# Patient Record
Sex: Male | Born: 1955
Health system: Southern US, Community
[De-identification: ages and names within clinical notes are randomized; demographics above are authoritative.]

## PROBLEM LIST (undated history)

## (undated) DIAGNOSIS — N186 End stage renal disease: Secondary | ICD-10-CM

## (undated) DIAGNOSIS — I10 Essential (primary) hypertension: Secondary | ICD-10-CM

## (undated) DIAGNOSIS — Z9289 Personal history of other medical treatment: Secondary | ICD-10-CM

## (undated) DIAGNOSIS — I2510 Atherosclerotic heart disease of native coronary artery without angina pectoris: Secondary | ICD-10-CM

## (undated) DIAGNOSIS — Z992 Dependence on renal dialysis: Secondary | ICD-10-CM

## (undated) DIAGNOSIS — R0600 Dyspnea, unspecified: Secondary | ICD-10-CM

## (undated) DIAGNOSIS — E079 Disorder of thyroid, unspecified: Secondary | ICD-10-CM

## (undated) DIAGNOSIS — B1920 Unspecified viral hepatitis C without hepatic coma: Secondary | ICD-10-CM

## (undated) DIAGNOSIS — F1910 Other psychoactive substance abuse, uncomplicated: Secondary | ICD-10-CM

## (undated) DIAGNOSIS — I739 Peripheral vascular disease, unspecified: Secondary | ICD-10-CM

## (undated) DIAGNOSIS — D649 Anemia, unspecified: Secondary | ICD-10-CM

## (undated) DIAGNOSIS — J449 Chronic obstructive pulmonary disease, unspecified: Secondary | ICD-10-CM

## (undated) HISTORY — PX: COLONOSCOPY: SHX174

## (undated) HISTORY — DX: Chronic obstructive pulmonary disease, unspecified: J44.9

## (undated) HISTORY — PX: LIVER BIOPSY: SHX301

## (undated) HISTORY — DX: Anemia, unspecified: D64.9

## (undated) HISTORY — PX: AV FISTULA PLACEMENT: SHX1204

## (undated) HISTORY — DX: Essential (primary) hypertension: I10

## (undated) HISTORY — DX: Disorder of thyroid, unspecified: E07.9

## (undated) HISTORY — DX: Peripheral vascular disease, unspecified: I73.9

## (undated) HISTORY — DX: Unspecified viral hepatitis C without hepatic coma: B19.20

## (undated) HISTORY — DX: Other psychoactive substance abuse, uncomplicated: F19.10

---

## 2003-10-16 ENCOUNTER — Encounter: Admission: RE | Admit: 2003-10-16 | Discharge: 2003-10-16 | Payer: Self-pay | Admitting: Family Medicine

## 2003-11-24 ENCOUNTER — Encounter: Admission: RE | Admit: 2003-11-24 | Discharge: 2003-11-24 | Payer: Self-pay | Admitting: Family Medicine

## 2003-12-09 ENCOUNTER — Encounter: Admission: RE | Admit: 2003-12-09 | Discharge: 2003-12-09 | Payer: Self-pay | Admitting: Sports Medicine

## 2003-12-11 ENCOUNTER — Ambulatory Visit (HOSPITAL_COMMUNITY): Admission: RE | Admit: 2003-12-11 | Discharge: 2003-12-11 | Payer: Self-pay | Admitting: Sports Medicine

## 2003-12-11 IMAGING — CT CT PELVIS W/O CM
1 series · 15 of 32 positions shown, 19 images · non-contrast
Comparison: None.

CLINICAL DATA: Left flank pain; proteinuria.
TECHNIQUE: Initially, helical CT through the abdomen and pelvis was performed at 5-mm collimation prior to IV contrast.  Oral contrast had been administered.  Subsequently, repeat helical CT through the abdomen was performed at 5-mm collimation during the intravenous administration of 100 cc [BV].  Delayed helical 5-mm images through the kidneys were obtained.

[Series 2: renal stone · axial · 0.70mm/px · z∈[-430,-95]mm · 15 of 143 slices shown, 19 images]
[im 10/143  soft-tissue]
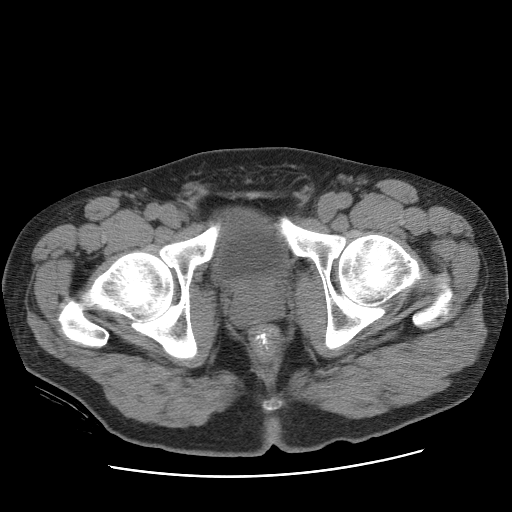
[im 10/143  bone]
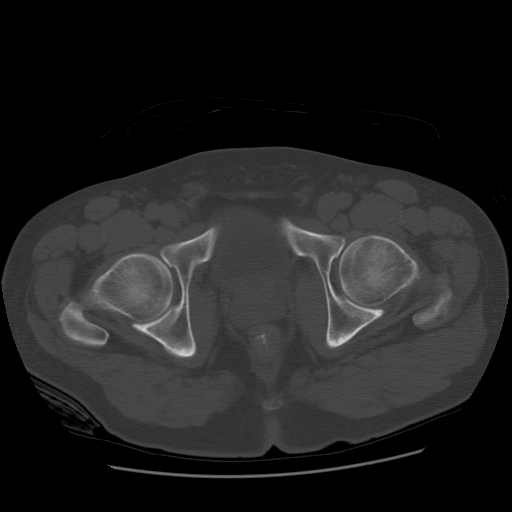
[im 19/143  soft-tissue]
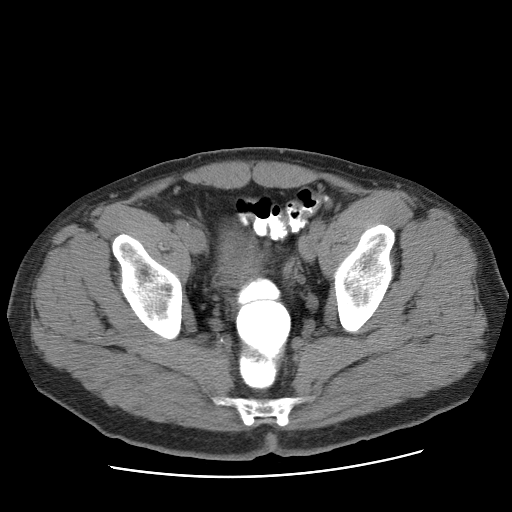
[im 28/143  soft-tissue]
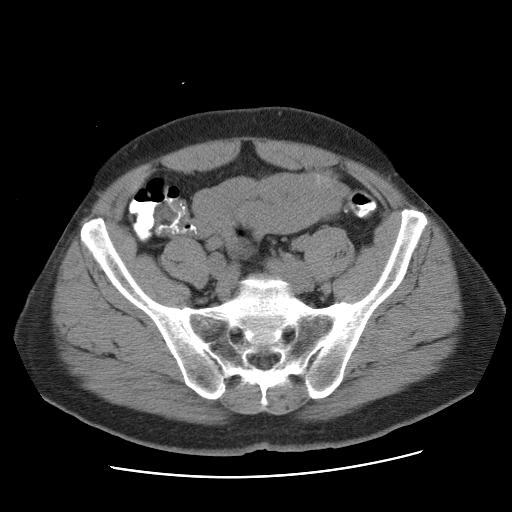
[im 42/143  soft-tissue]
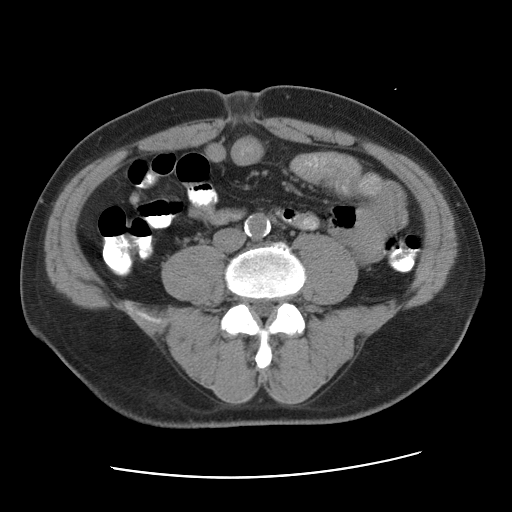
[im 51/143  soft-tissue]
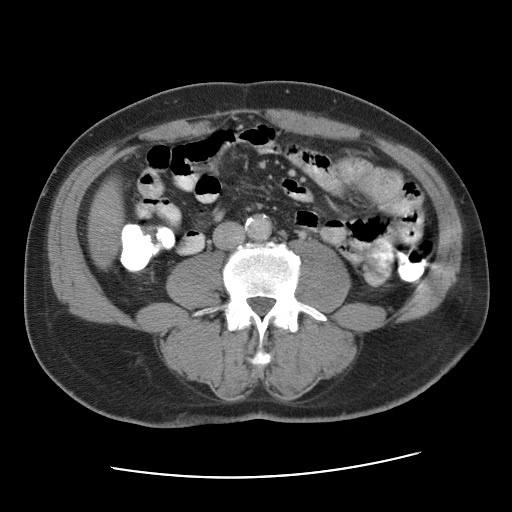
[im 60/143  soft-tissue]
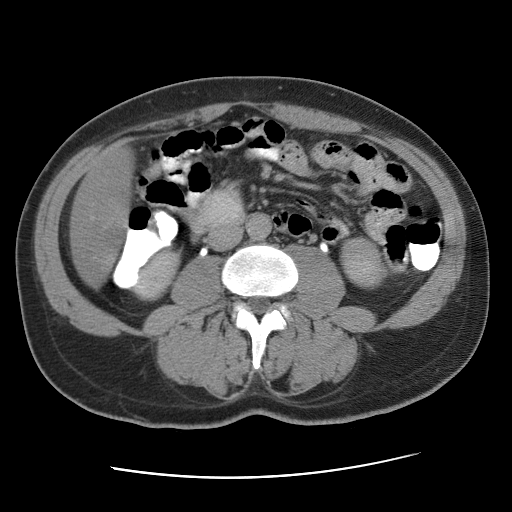
[im 74/143  soft-tissue]
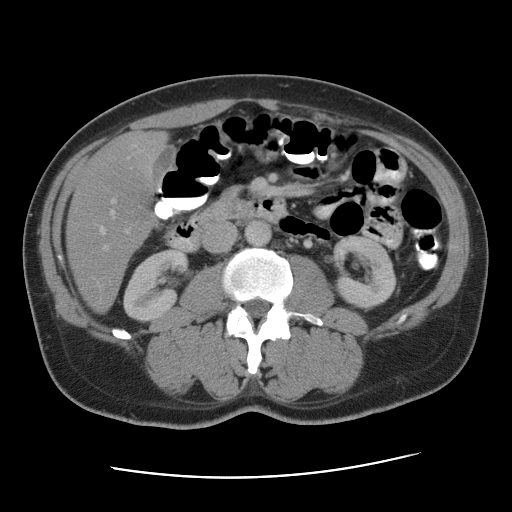
[im 83/143  soft-tissue]
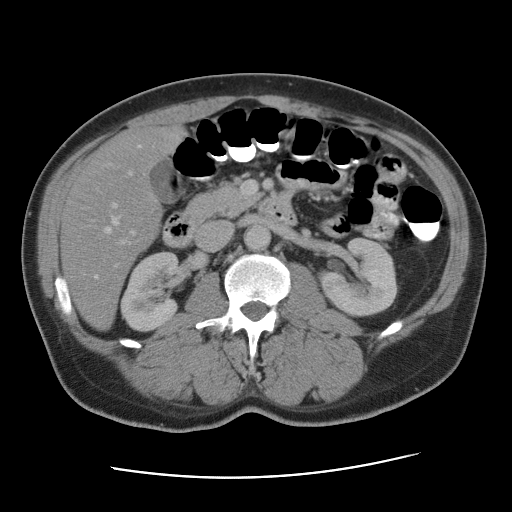
[im 92/143  soft-tissue]
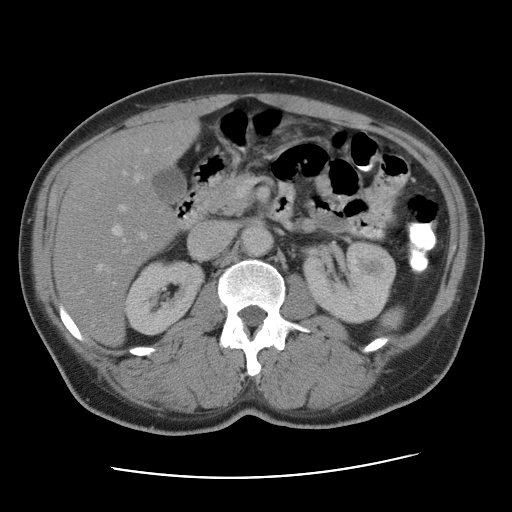
[im 92/143  bone]
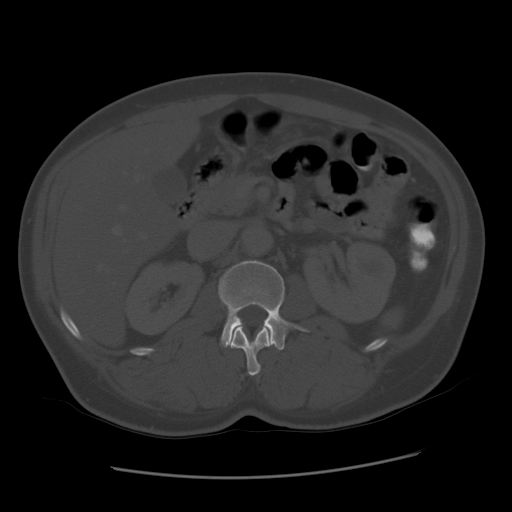
[im 101/143  soft-tissue]
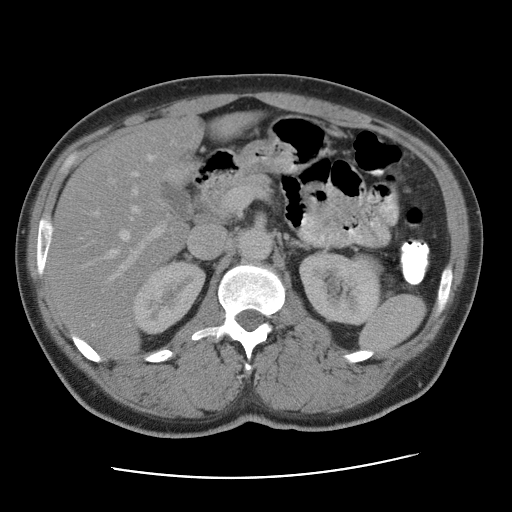
[im 115/143  soft-tissue]
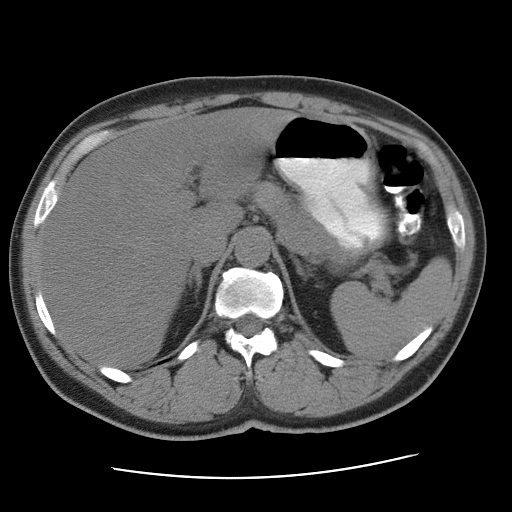
[im 124/143  soft-tissue]
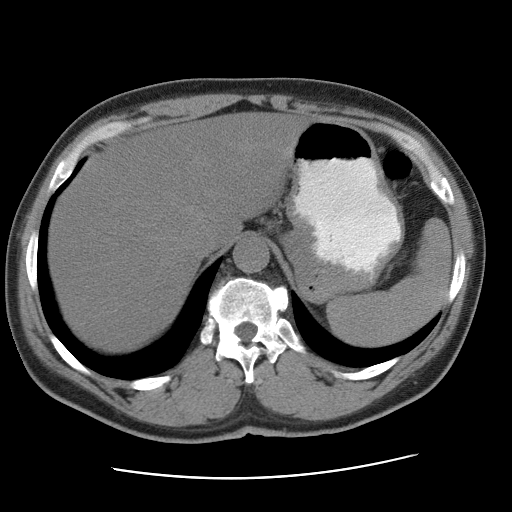
[im 124/143  lung]
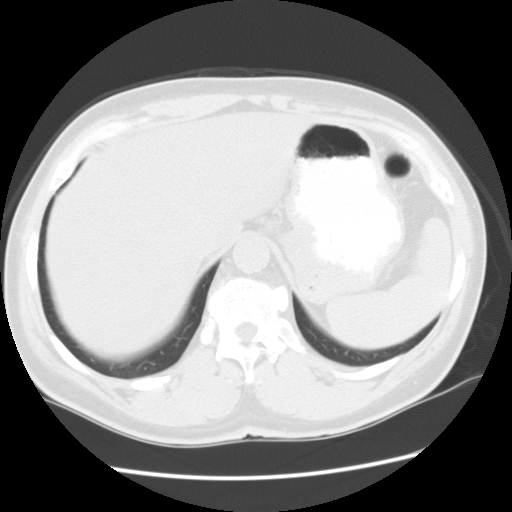
[im 129/143  lung]
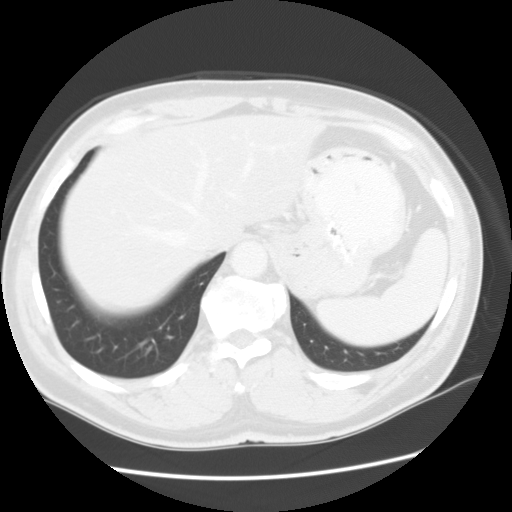
[im 133/143  soft-tissue]
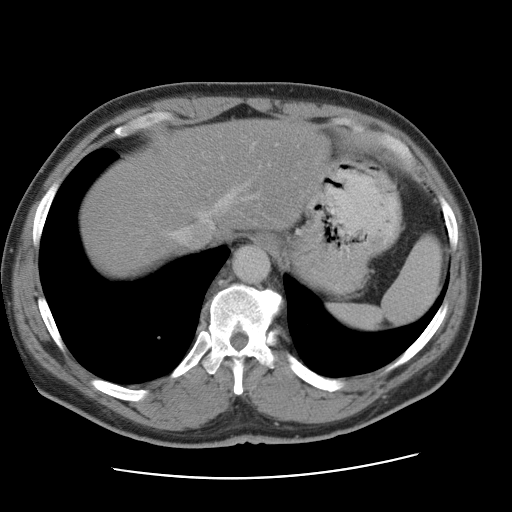
[im 133/143  lung]
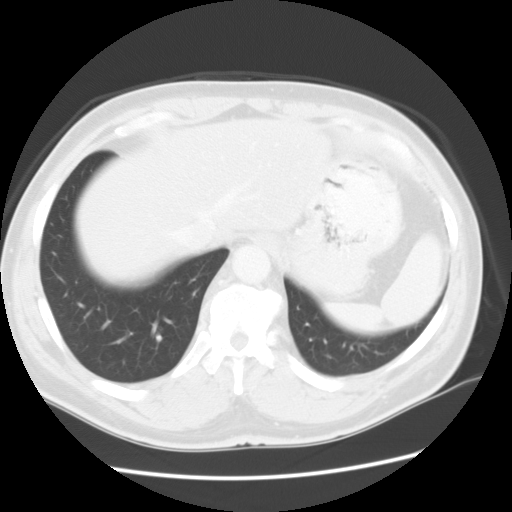
[im 138/143  lung]
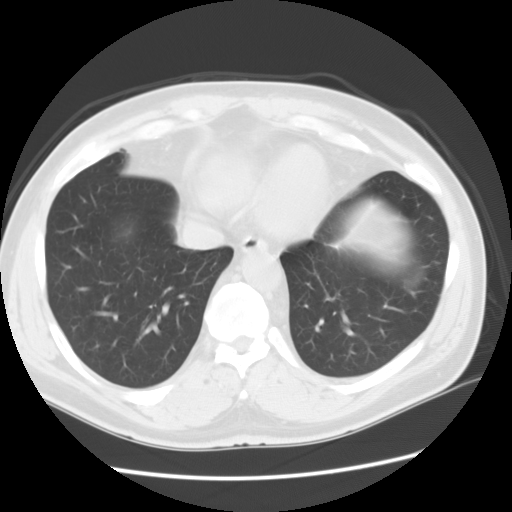

[15 of 32 positions shown; findings below may reference images not displayed]

CT ABDOMEN-WITHOUT AND WITH CONTRAST:  [DATE]
The unenhanced images demonstrate no intrarenal or proximal ureteral calculi.  The enhanced images show normal and symmetric excretion by both kidneys.  There is no evidence of hydronephrosis.  Simple cysts are present in the left kidney; no significant focal renal parenchymal abnormalities are identified involving either kidney.  
The liver, spleen, pancreas, and adrenal glands are normal in appearance.  The gallbladder is unremarkable by CT and there is no biliary ductal dilation.  Stomach and visualized large and small bowel are unremarkable in the upper abdomen.  There is no significant lymphadenopathy.  Aortic atherosclerosis is present without aneurysm.  There is no free fluid.  Visualized lung bases appear clear.  Note is made of an umbilical hernia containing fat.  
IMPRESSION
Left renal cysts.  No significant abnormalities involving the kidneys.  No evidence of urinary tract obstruction or calculi.
Small umbilical hernia containing fat.
Aortic atherosclerosis without aneurysm.

CT PELVIS WITHOUT CONTRAST:  [DATE]
No distal ureteral calculi are identified on either side.  Prostate gland is mildly enlarged, particularly the median lobe.  Urinary bladder is grossly normal.  Scattered sigmoid diverticula are present without diverticulitis.  Mild iliac atherosclerosis is present without aneurysm.  Small bowel has normal appearance.  There is no free fluid.  
IMPRESSION
No distal ureteral calculi.
Mild prostate gland enlargement.
Sigmoid diverticulosis.

## 2006-06-09 ENCOUNTER — Emergency Department (HOSPITAL_COMMUNITY): Admission: EM | Admit: 2006-06-09 | Discharge: 2006-06-09 | Payer: Self-pay | Admitting: Emergency Medicine

## 2006-09-07 DIAGNOSIS — I10 Essential (primary) hypertension: Secondary | ICD-10-CM | POA: Insufficient documentation

## 2006-09-07 DIAGNOSIS — B1710 Acute hepatitis C without hepatic coma: Secondary | ICD-10-CM | POA: Insufficient documentation

## 2006-09-07 DIAGNOSIS — E7800 Pure hypercholesterolemia, unspecified: Secondary | ICD-10-CM | POA: Insufficient documentation

## 2006-10-13 ENCOUNTER — Ambulatory Visit: Payer: Self-pay | Admitting: Internal Medicine

## 2006-10-30 ENCOUNTER — Ambulatory Visit: Payer: Self-pay | Admitting: Internal Medicine

## 2006-11-01 ENCOUNTER — Ambulatory Visit: Payer: Self-pay | Admitting: Internal Medicine

## 2006-11-03 ENCOUNTER — Ambulatory Visit: Payer: Self-pay | Admitting: Internal Medicine

## 2007-08-01 ENCOUNTER — Ambulatory Visit: Payer: Self-pay | Admitting: Internal Medicine

## 2007-08-01 LAB — CONVERTED CEMR LAB
BUN: 19 mg/dL (ref 6–23)
CO2: 25 meq/L (ref 19–32)
Calcium: 8 mg/dL — ABNORMAL LOW (ref 8.4–10.5)
Chlamydia, DNA Probe: NEGATIVE
Chloride: 106 meq/L (ref 96–112)
Creatinine, Ser: 1.62 mg/dL — ABNORMAL HIGH (ref 0.40–1.50)
GC Probe Amp, Genital: POSITIVE — AB
Glucose, Bld: 78 mg/dL (ref 70–99)
Potassium: 4 meq/L (ref 3.5–5.3)
Sodium: 141 meq/L (ref 135–145)

## 2007-09-05 ENCOUNTER — Ambulatory Visit: Payer: Self-pay | Admitting: *Deleted

## 2007-10-04 ENCOUNTER — Ambulatory Visit: Payer: Self-pay | Admitting: Internal Medicine

## 2007-10-04 LAB — CONVERTED CEMR LAB
ALT: 22 units/L (ref 0–53)
AST: 38 units/L — ABNORMAL HIGH (ref 0–37)
Albumin: 3.5 g/dL (ref 3.5–5.2)
Alkaline Phosphatase: 63 units/L (ref 39–117)
BUN: 24 mg/dL — ABNORMAL HIGH (ref 6–23)
CO2: 24 meq/L (ref 19–32)
Calcium: 8.5 mg/dL (ref 8.4–10.5)
Chloride: 107 meq/L (ref 96–112)
Cholesterol: 199 mg/dL (ref 0–200)
Creatinine, Ser: 1.91 mg/dL — ABNORMAL HIGH (ref 0.40–1.50)
Glucose, Bld: 69 mg/dL — ABNORMAL LOW (ref 70–99)
HDL: 101 mg/dL (ref >39)
LDL Cholesterol: 67 mg/dL (ref 0–99)
Potassium: 4.6 meq/L (ref 3.5–5.3)
Sodium: 141 meq/L (ref 135–145)
Total Bilirubin: 0.5 mg/dL (ref 0.3–1.2)
Total CHOL/HDL Ratio: 2
Total Protein: 7.1 g/dL (ref 6.0–8.3)
Triglycerides: 154 mg/dL — ABNORMAL HIGH (ref <150)
VLDL: 31 mg/dL (ref 0–40)

## 2007-11-19 ENCOUNTER — Encounter: Payer: Self-pay | Admitting: Internal Medicine

## 2007-11-19 LAB — CONVERTED CEMR LAB
ALT: 14 units/L (ref 0–53)
AST: 17 units/L (ref 0–37)
Absolute CD4: 312 #/uL — ABNORMAL LOW (ref 381–1469)
Albumin: 4.7 g/dL (ref 3.5–5.2)
Alkaline Phosphatase: 97 units/L (ref 39–117)
BUN: 17 mg/dL (ref 6–23)
Basophils Absolute: 0 10*3/uL (ref 0.0–0.1)
Basophils Relative: 1 % (ref 0–1)
CD4 T Helper %: 18 % — ABNORMAL LOW (ref 32–62)
CO2: 22 meq/L (ref 19–32)
Calcium: 9.2 mg/dL (ref 8.4–10.5)
Chloride: 106 meq/L (ref 96–112)
Creatinine, Ser: 0.97 mg/dL (ref 0.40–1.50)
Eosinophils Absolute: 0.4 10*3/uL (ref 0.0–0.7)
Eosinophils Relative: 6 % — ABNORMAL HIGH (ref 0–5)
Glucose, Bld: 83 mg/dL (ref 70–99)
HCT: 41.2 % (ref 39.0–52.0)
HIV 1 RNA Quant: <50 copies/mL (ref <50)
HIV-1 RNA Quant, Log: <1.7 (ref <1.70)
Hemoglobin: 13.3 g/dL (ref 13.0–17.0)
Hep B S Ab: POSITIVE — AB
Lymphocytes Relative: 31 % (ref 12–46)
Lymphs Abs: 1.7 10*3/uL (ref 0.7–4.0)
MCHC: 32.3 g/dL (ref 30.0–36.0)
MCV: 92.4 fL (ref 78.0–100.0)
Monocytes Absolute: 1 10*3/uL (ref 0.1–1.0)
Monocytes Relative: 17 % — ABNORMAL HIGH (ref 3–12)
Neutro Abs: 2.6 10*3/uL (ref 1.7–7.7)
Neutrophils Relative %: 45 % (ref 43–77)
Platelets: 308 10*3/uL (ref 150–400)
Potassium: 4.2 meq/L (ref 3.5–5.3)
RBC: 4.46 M/uL (ref 4.22–5.81)
RDW: 14.5 % (ref 11.5–15.5)
Sodium: 139 meq/L (ref 135–145)
Total Bilirubin: 0.4 mg/dL (ref 0.3–1.2)
Total Lymphocytes %: 31 % (ref 12–46)
Total Protein: 7.8 g/dL (ref 6.0–8.3)
Total lymphocyte count: 1736 cells/mcL (ref 700–3300)
WBC, lymph enumeration: 5.6 10*3/uL (ref 4.0–10.5)
WBC: 5.6 10*3/uL (ref 4.0–10.5)

## 2008-01-30 ENCOUNTER — Emergency Department (HOSPITAL_COMMUNITY): Admission: EM | Admit: 2008-01-30 | Discharge: 2008-01-30 | Payer: Self-pay | Admitting: Emergency Medicine

## 2008-01-30 IMAGING — CT CT CERVICAL SPINE W/O CM
4 of 5 series · 15 of 33 positions shown, 17 images · non-contrast
Comparison: None

CT HEAD

CLINICAL DATA: Fell off ladder, laceration of left

CT CERVICAL SPINE WITHOUT CONTRAST,CT HEAD WITHOUT CONTRAST
TECHNIQUE: Multidetector CT imaging of the cervical spine was
performed. Multiplanar CT image reconstructions were also
generated,Technique:  Contiguous axial images were obtained from
the base of the skull through the vertex without contrast..

[Series 4: head trauma 2.4 h60s · axial · 0.45mm/px · z∈[-565,-478]mm · 3 of 72 slices shown]
[im 18/72  bone]
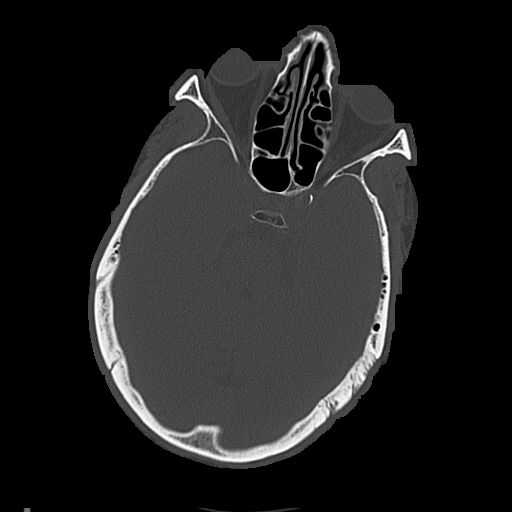
[im 36/72  bone]
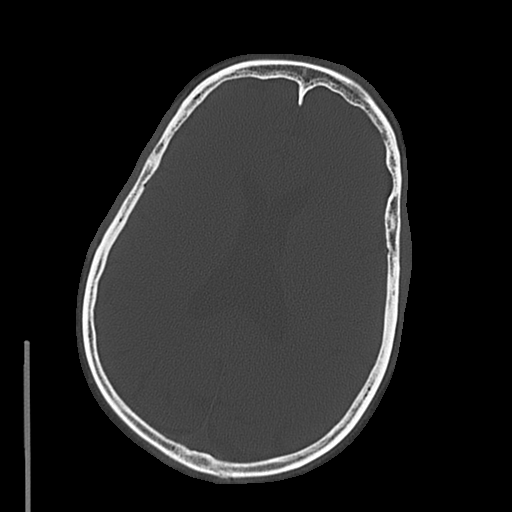
[im 54/72  bone]
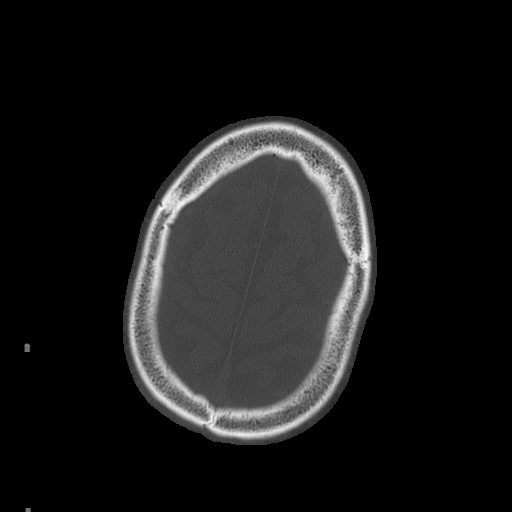

[Series 6: c_spine 2.0 b31s detail · axial · 0.29mm/px · z∈[-730,-622]mm · 4 of 91 slices shown, 5 images]
[im 19/91  soft-tissue]
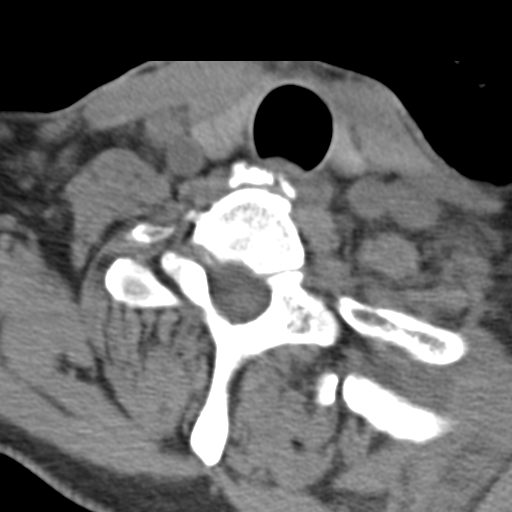
[im 19/91  bone]
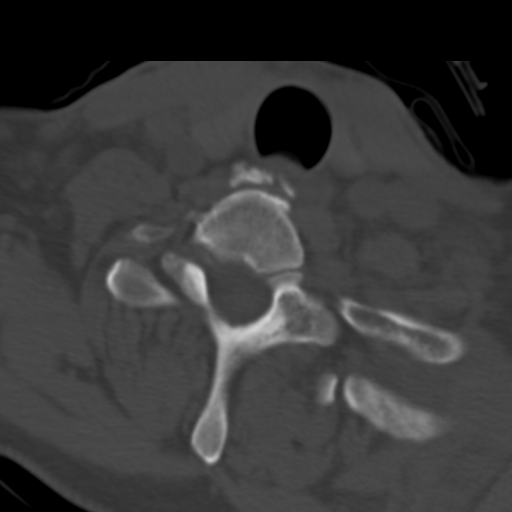
[im 37/91  bone]
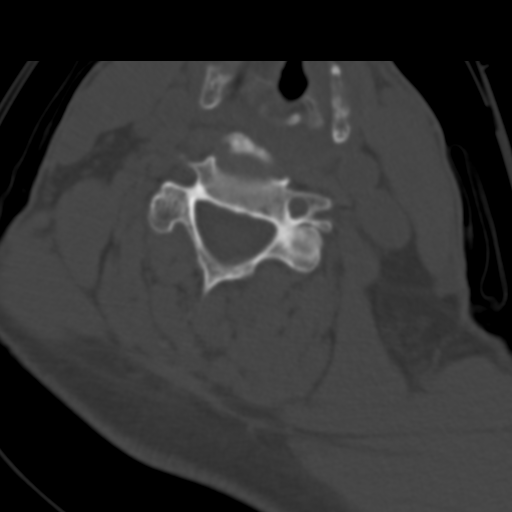
[im 55/91  bone]
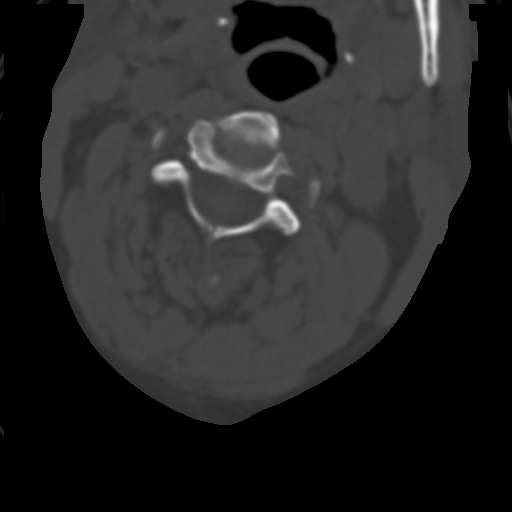
[im 73/91  bone]
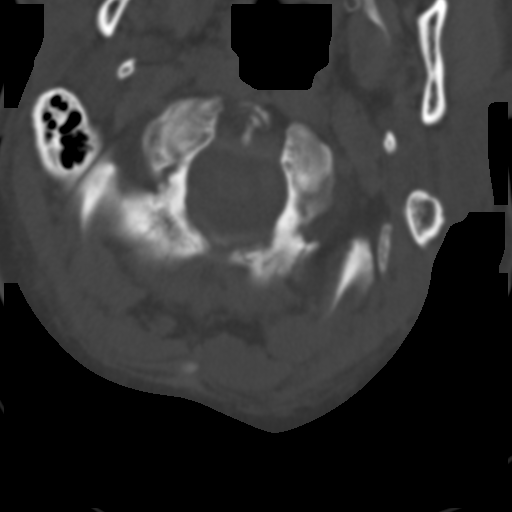

[Series 604: sagittals s.t. · sagittal · 0.35mm/px · 5 of 48 slices shown, 6 images]
[im 16/48  bone]
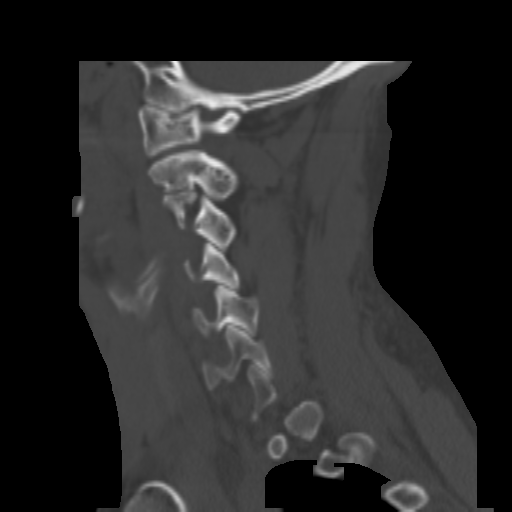
[im 20/48  bone]
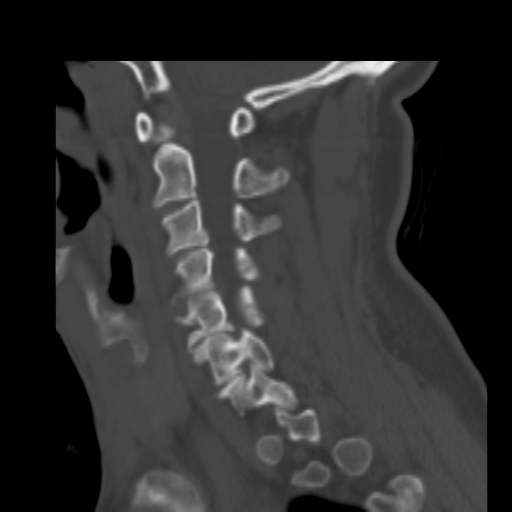
[im 24/48  soft-tissue]
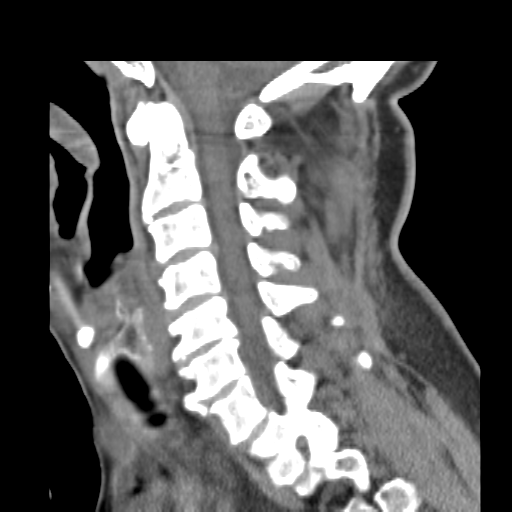
[im 24/48  bone]
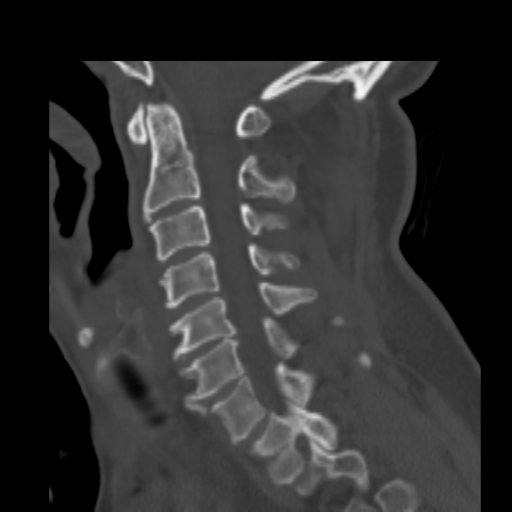
[im 28/48  bone]
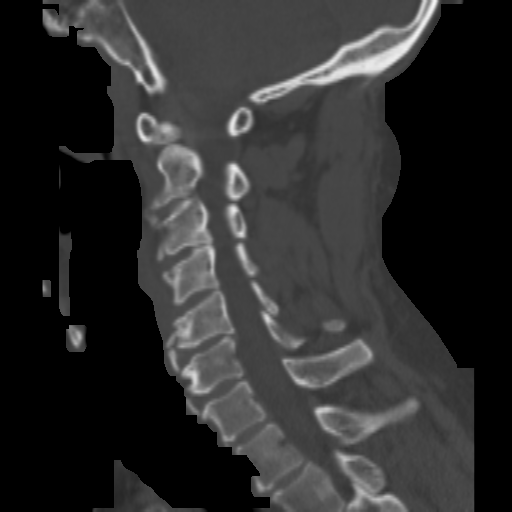
[im 32/48  bone]
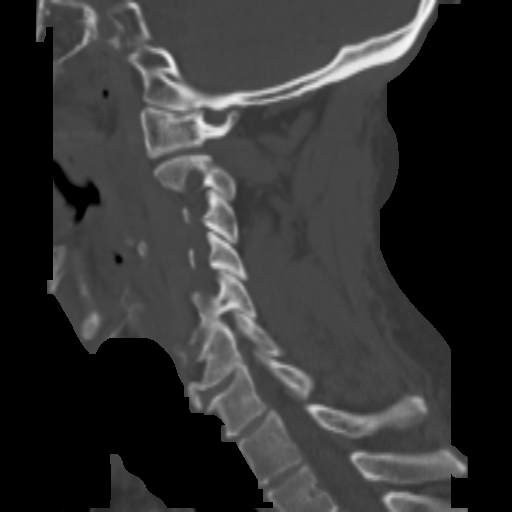

[Series 605: coronals s.t. · coronal · 0.35mm/px · 3 of 50 slices shown]
[im 10/50  bone]
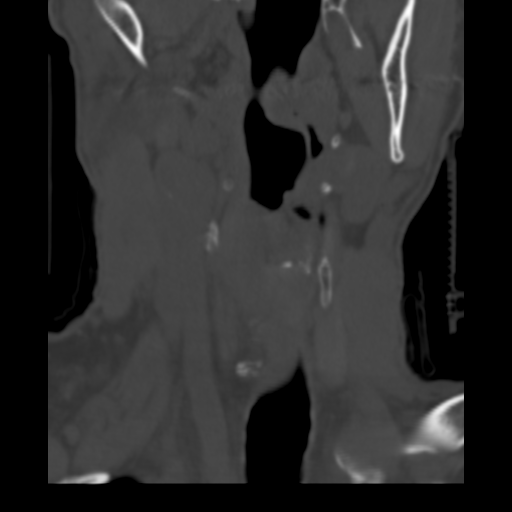
[im 20/50  bone]
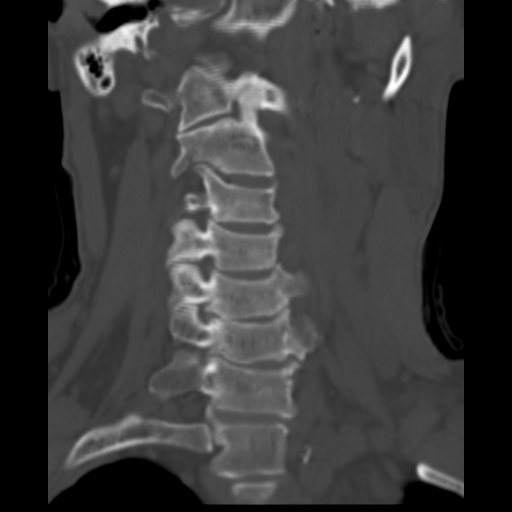
[im 30/50  bone]
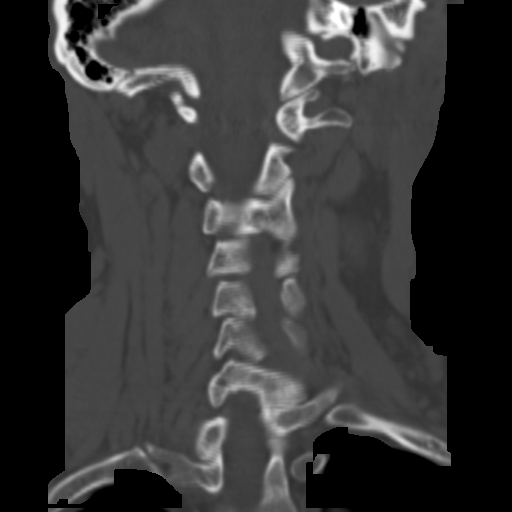

[15 of 33 positions shown; findings below may reference images not displayed]

FINDINGS: No extra-axial fluid collections or intraparenchymal
hemorrhage.  No midline shift or mass effect.  Ventricles are
normal volume.  Basilar cisterns are patent.  Paranasal sinuses and
mastoid air cells are clear.  Orbits are normal.

The small scalp hematoma and just superior to the left orbit.  No
associated skull fracture.
IMPRESSION: 1.  No evidence of acute intracranial trauma.

2.  Small scalp hematoma superior to the left orbit.

CT CERVICAL SPINE
FINDINGS: No prevertebral soft tissue swelling.  No evidence of
subluxation.  Craniocervical junction is intact.  Normal facet
articulation.  No evidence of epidural or paraspinal hematoma.
There is end plate osteophytosis most prominent at C5-C6 and C6-C7.
IMPRESSION: 1. No evidence of cervical spine fracture.

## 2008-01-30 IMAGING — CR DG SHOULDER 2+V*R*
2 series · 2 of 2 positions shown · non-contrast
Comparison: No comparison

CLINICAL DATA: Fell off ladder - right shoulder pain.

RIGHT SHOULDER - 2+ VIEW

[t shoulder ap internal righ]
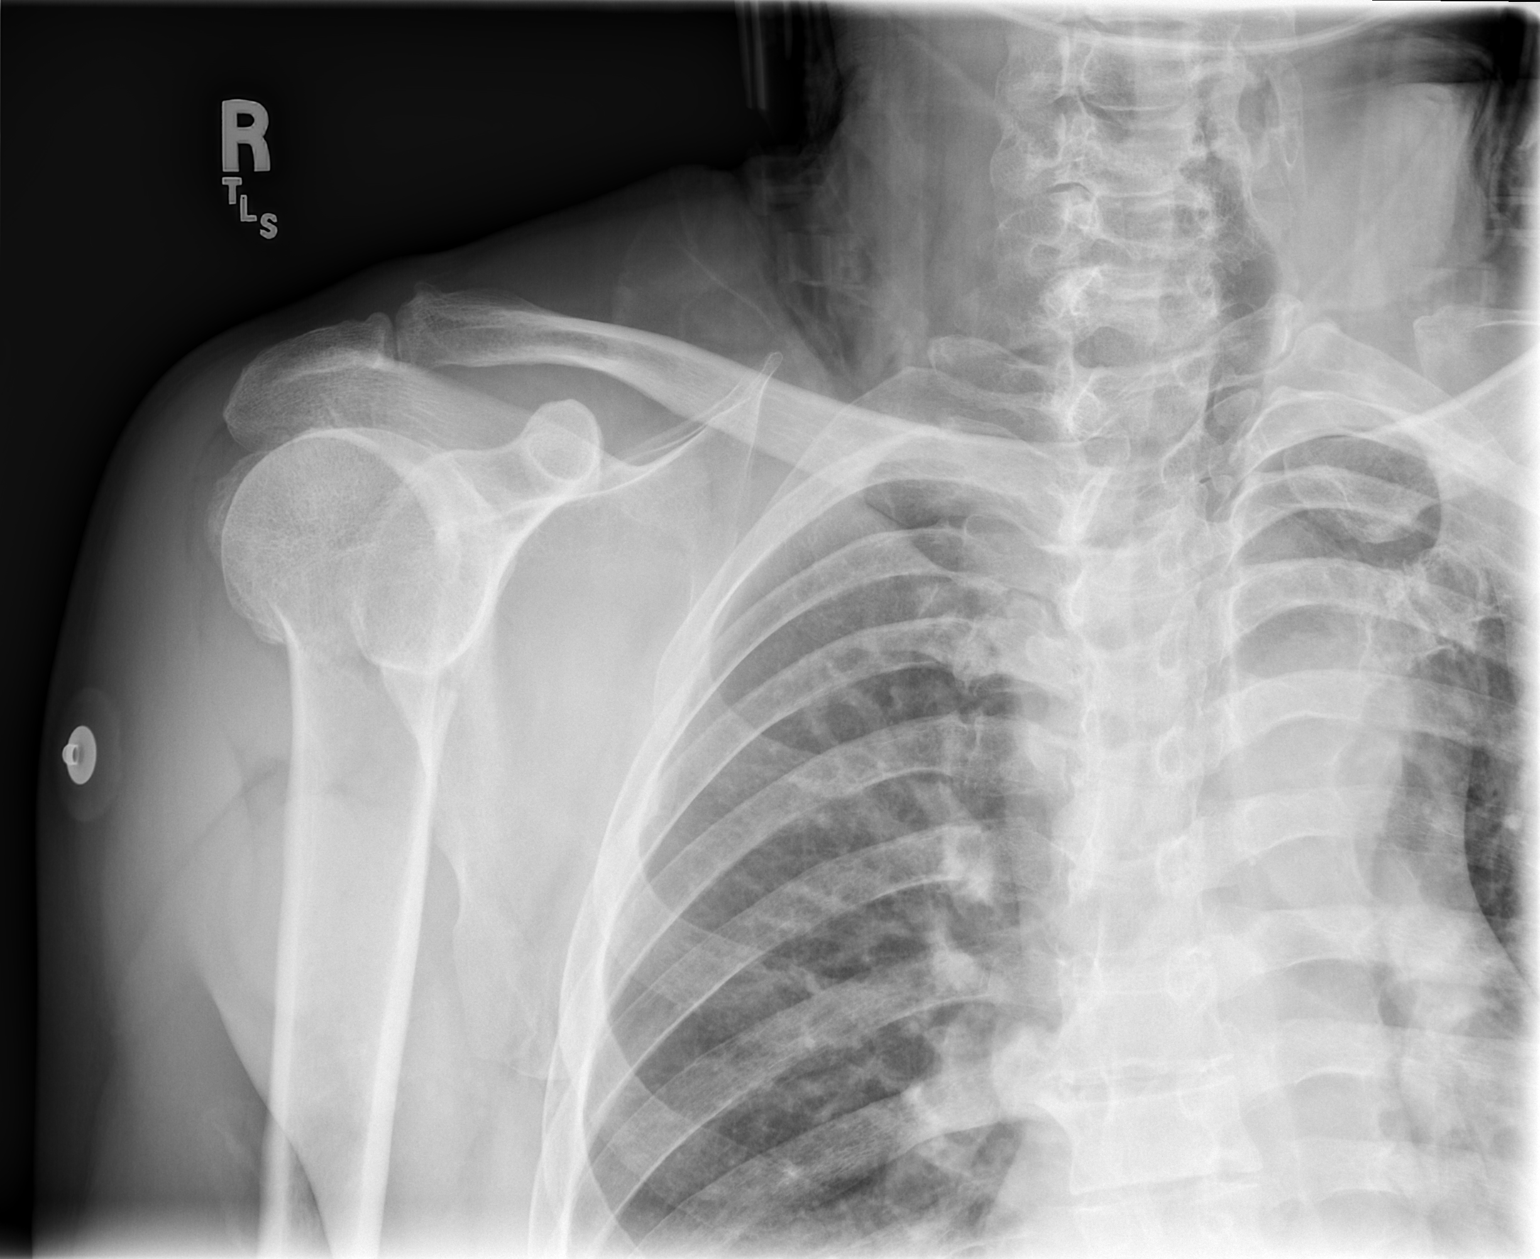

[t shoulder y view right]
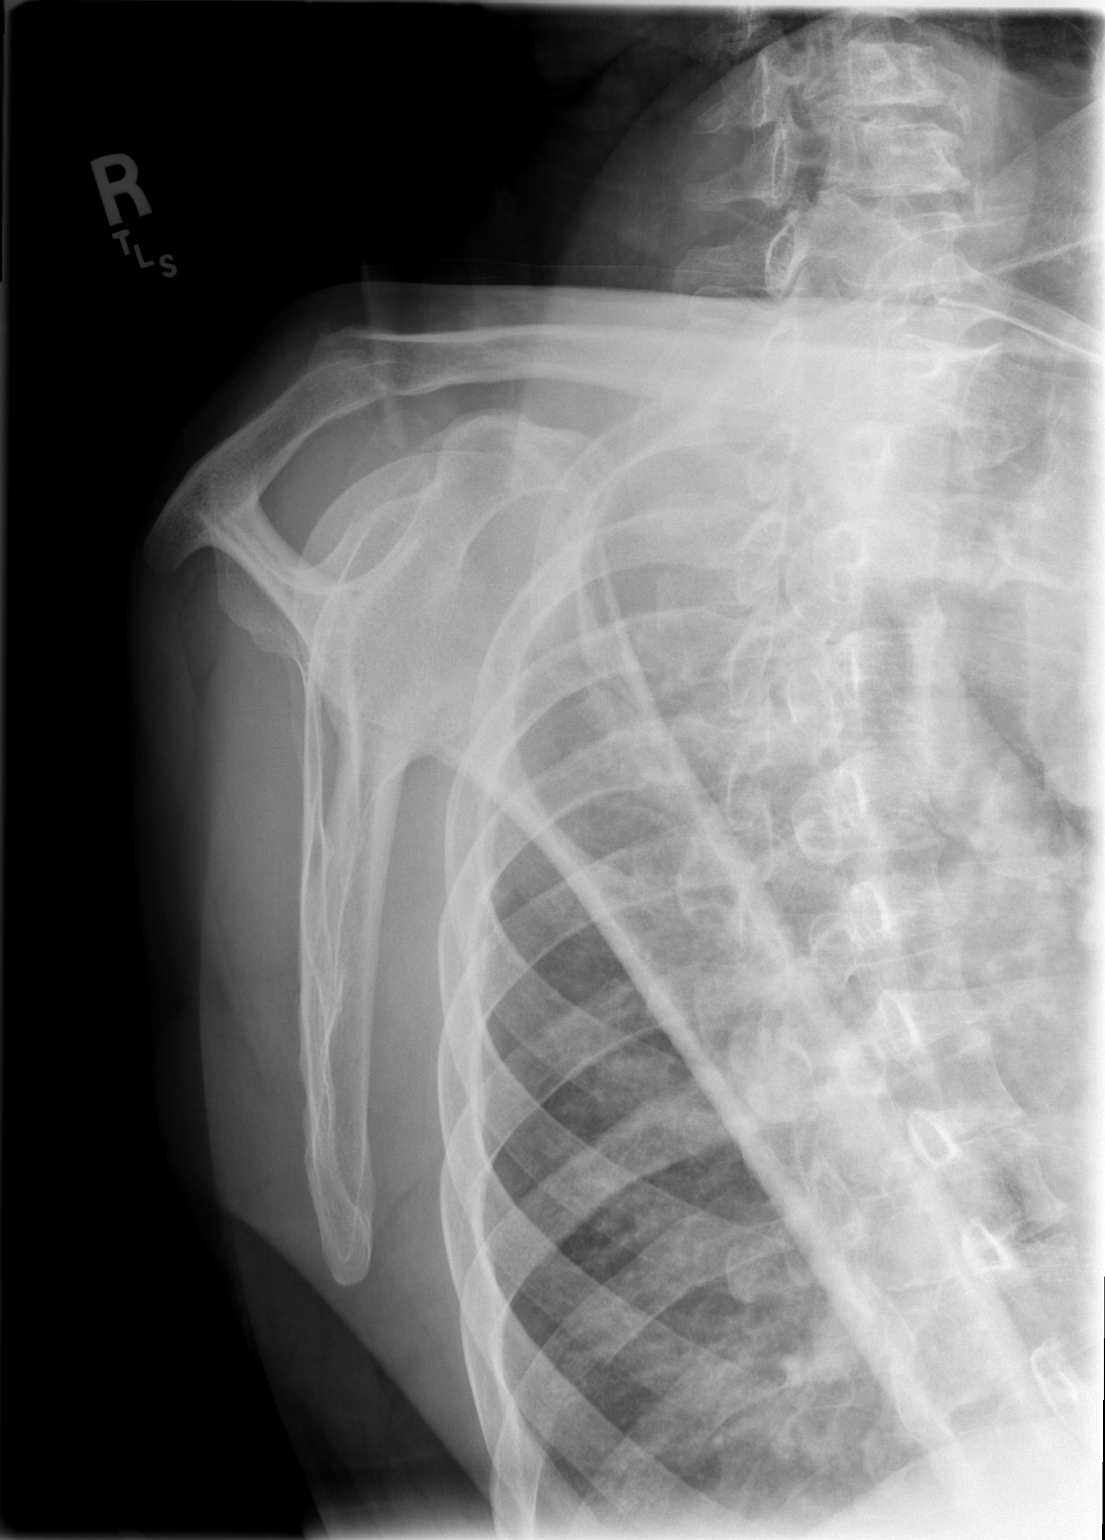

[2 of 2 positions shown; findings below may reference images not displayed]

FINDINGS: There is a fracture of the right humeral head and neck
with what appears to be impaction of the humeral neck into the
head.  The glenohumeral joint appears anatomical.  There are
degenerative changes of the AC joint.
IMPRESSION: Acute  fracture of the right humeral head and neck.

## 2008-04-10 ENCOUNTER — Ambulatory Visit: Payer: Self-pay | Admitting: Internal Medicine

## 2009-02-05 ENCOUNTER — Ambulatory Visit: Payer: Self-pay | Admitting: Internal Medicine

## 2010-05-07 ENCOUNTER — Ambulatory Visit: Payer: Self-pay | Admitting: Internal Medicine

## 2010-05-07 ENCOUNTER — Inpatient Hospital Stay (HOSPITAL_COMMUNITY)
Admission: EM | Admit: 2010-05-07 | Discharge: 2010-05-17 | Payer: Self-pay | Source: Home / Self Care | Admitting: Emergency Medicine

## 2010-05-07 IMAGING — CT CT ABD-PELV W/O CM
2 of 4 series · 14 of 32 positions shown, 19 images · non-contrast
Comparison: CT abdomen [DATE]

CLINICAL DATA: , acute renal failure, decreased hemoglobin.

CT ABDOMEN AND PELVIS WITHOUT CONTRAST
TECHNIQUE: Multidetector CT imaging of the abdomen and pelvis was
performed following the standard protocol without intravenous
contrast.

[Series 2: routine abdomen · axial · 0.70mm/px · z∈[-418,-88]mm · 7 of 88 slices shown, 12 images]
[im 11/88  soft-tissue]
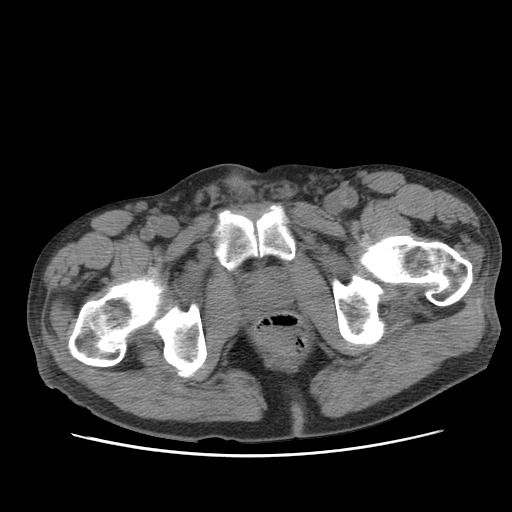
[im 11/88  bone]
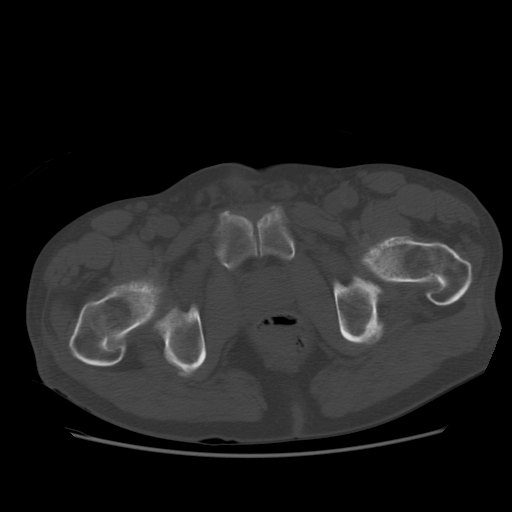
[im 22/88  soft-tissue]
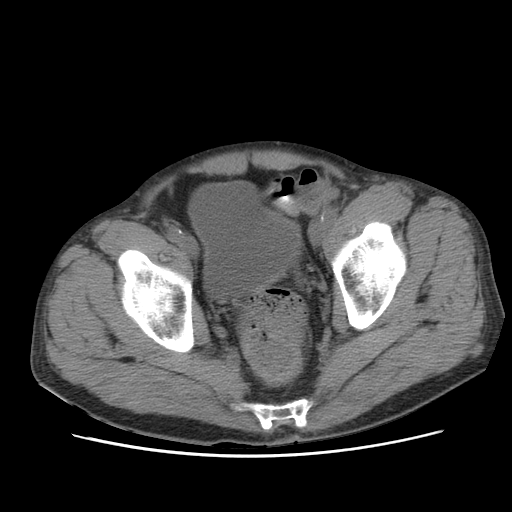
[im 33/88  soft-tissue]
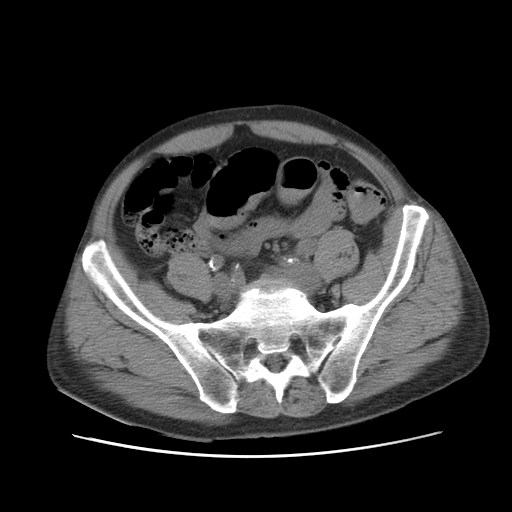
[im 44/88  soft-tissue]
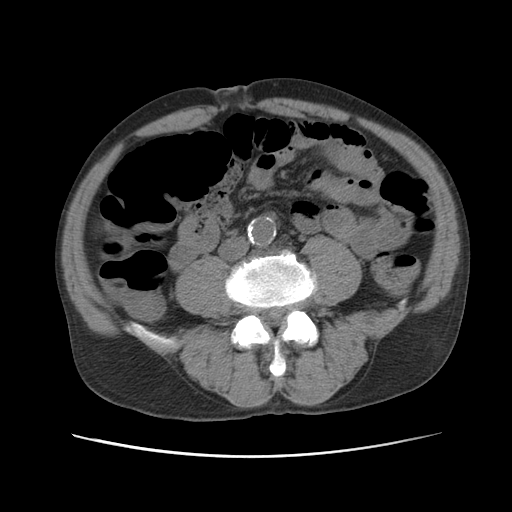
[im 44/88  lung]
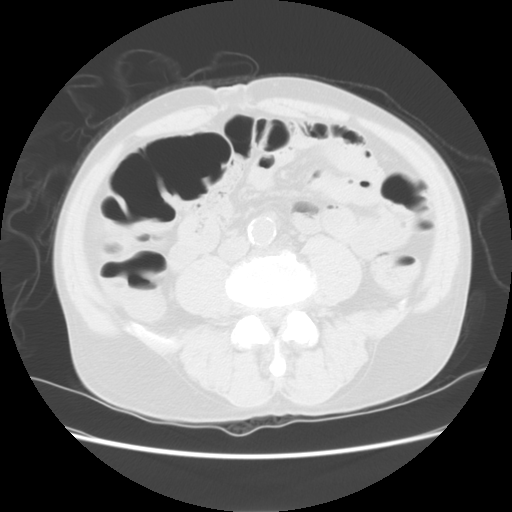
[im 55/88  soft-tissue]
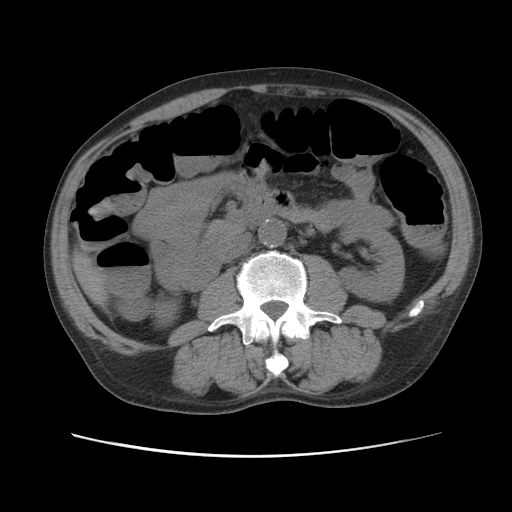
[im 55/88  lung]
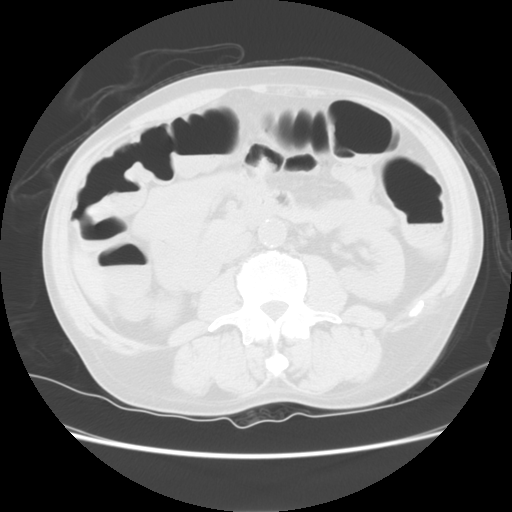
[im 66/88  soft-tissue]
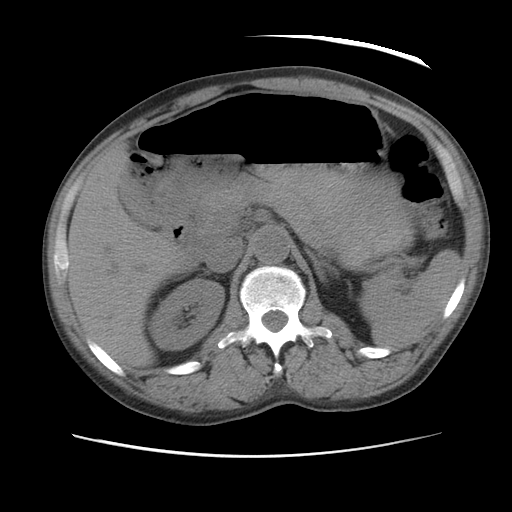
[im 66/88  lung]
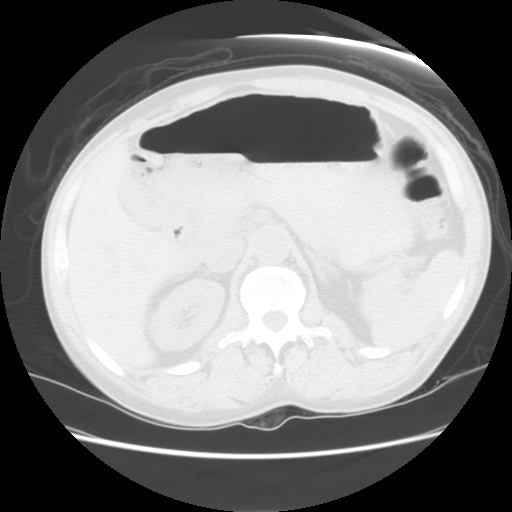
[im 77/88  soft-tissue]
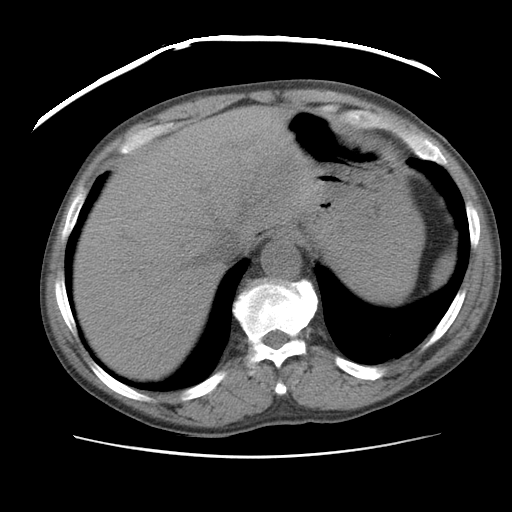
[im 77/88  lung]
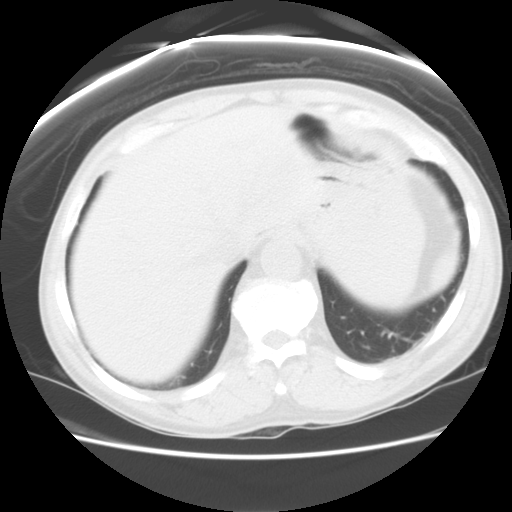

[Series 400: sag · sagittal · 0.87mm/px · 7 of 100 slices shown]
[im 10/100  soft-tissue]
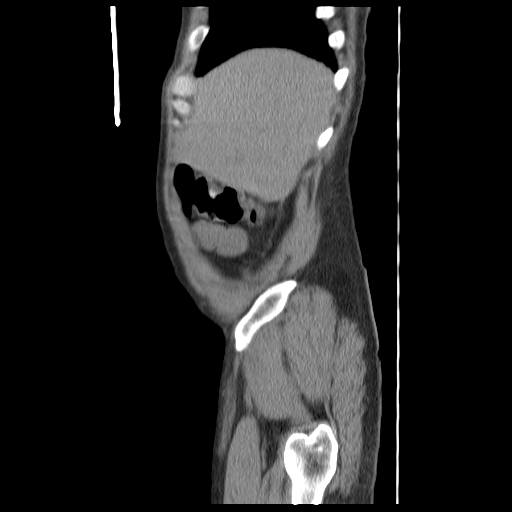
[im 20/100  soft-tissue]
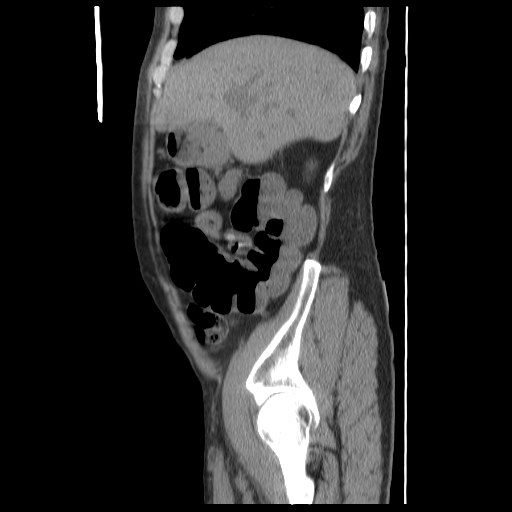
[im 30/100  soft-tissue]
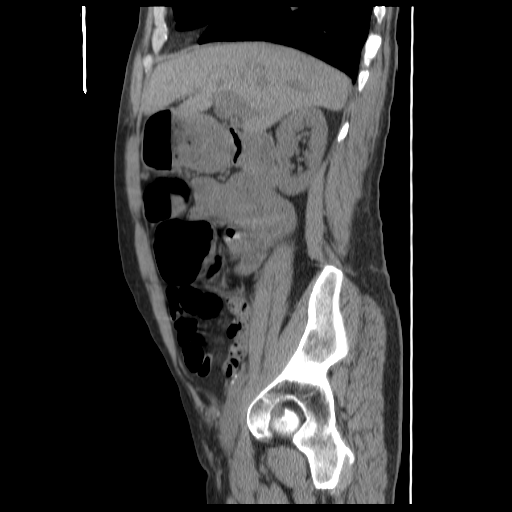
[im 40/100  soft-tissue]
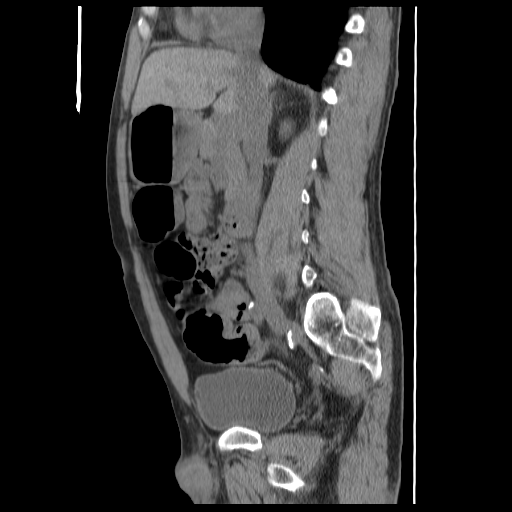
[im 60/100  soft-tissue]
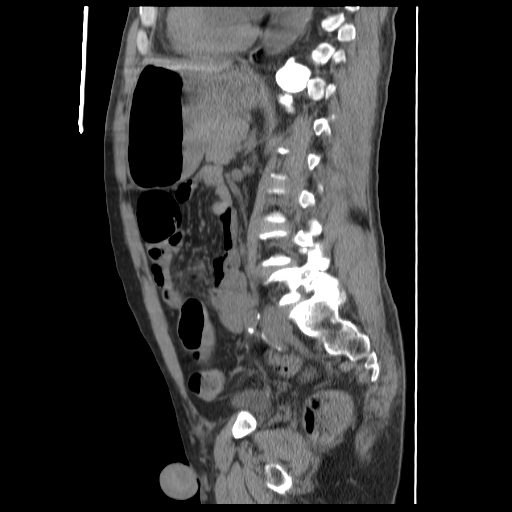
[im 70/100  soft-tissue]
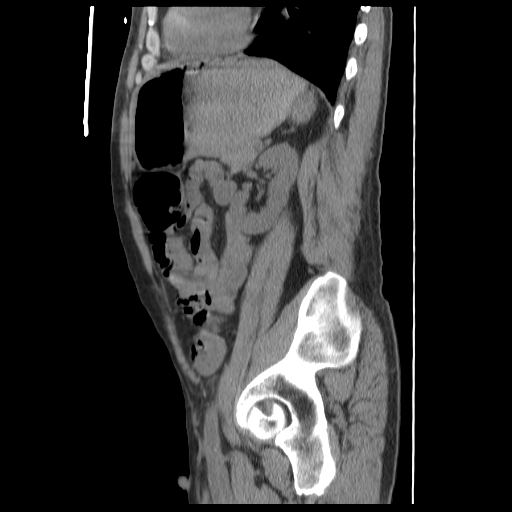
[im 80/100  soft-tissue]
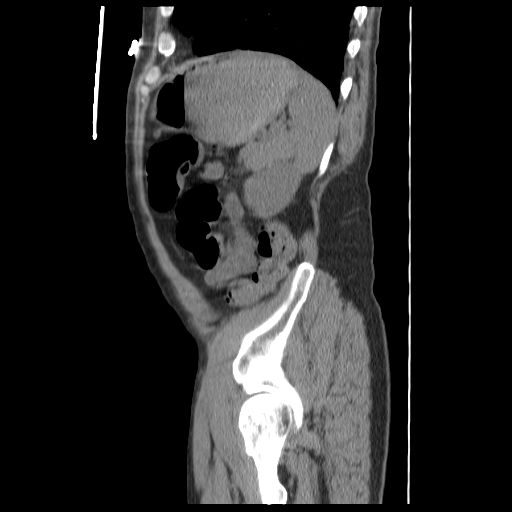

[14 of 32 positions shown; findings below may reference images not displayed]

FINDINGS: Lung bases are clear no pericardial pleural fluid.

Non-IV contrast images demonstrate no focal hepatic lesion.  The
gallbladder is likely surgically absent.  There is exam is
difficult due to lack of IV and oral contrast.  The pancreas,
spleen, adrenal glands, and kidneys appear normal.

The stomach is distended with fluid.  No gross evidence of small
bowel obstruction.  Appendix appears normal.  The colon contains
fluid stool throughout.  There is stool  within the rectosigmoid
colon.

Abdominal aorta is normal caliber.  No retroperitoneal
lymphadenopathy.

No free fluid the pelvis.  Prostate bladder appear normal.  No
evidence of pelvic lymphadenopathy.

Review of  bone windows demonstrates no aggressive osseous lesions.
IMPRESSION: 1.  No acute abdominal or pelvic process.  Sensitivity for
pathology may be reduced  with the lack of IV or oral contrast.

2.  Fluid stool throughout the colon.

## 2010-05-08 IMAGING — CR DG CHEST 2V
2 series · 2 of 2 positions shown · non-contrast
Comparison: CT abdomen [DATE].

CLINICAL DATA: 54-year-old male with fluid overload, renal failure.

CHEST - 2 VIEW

[w chest pa]
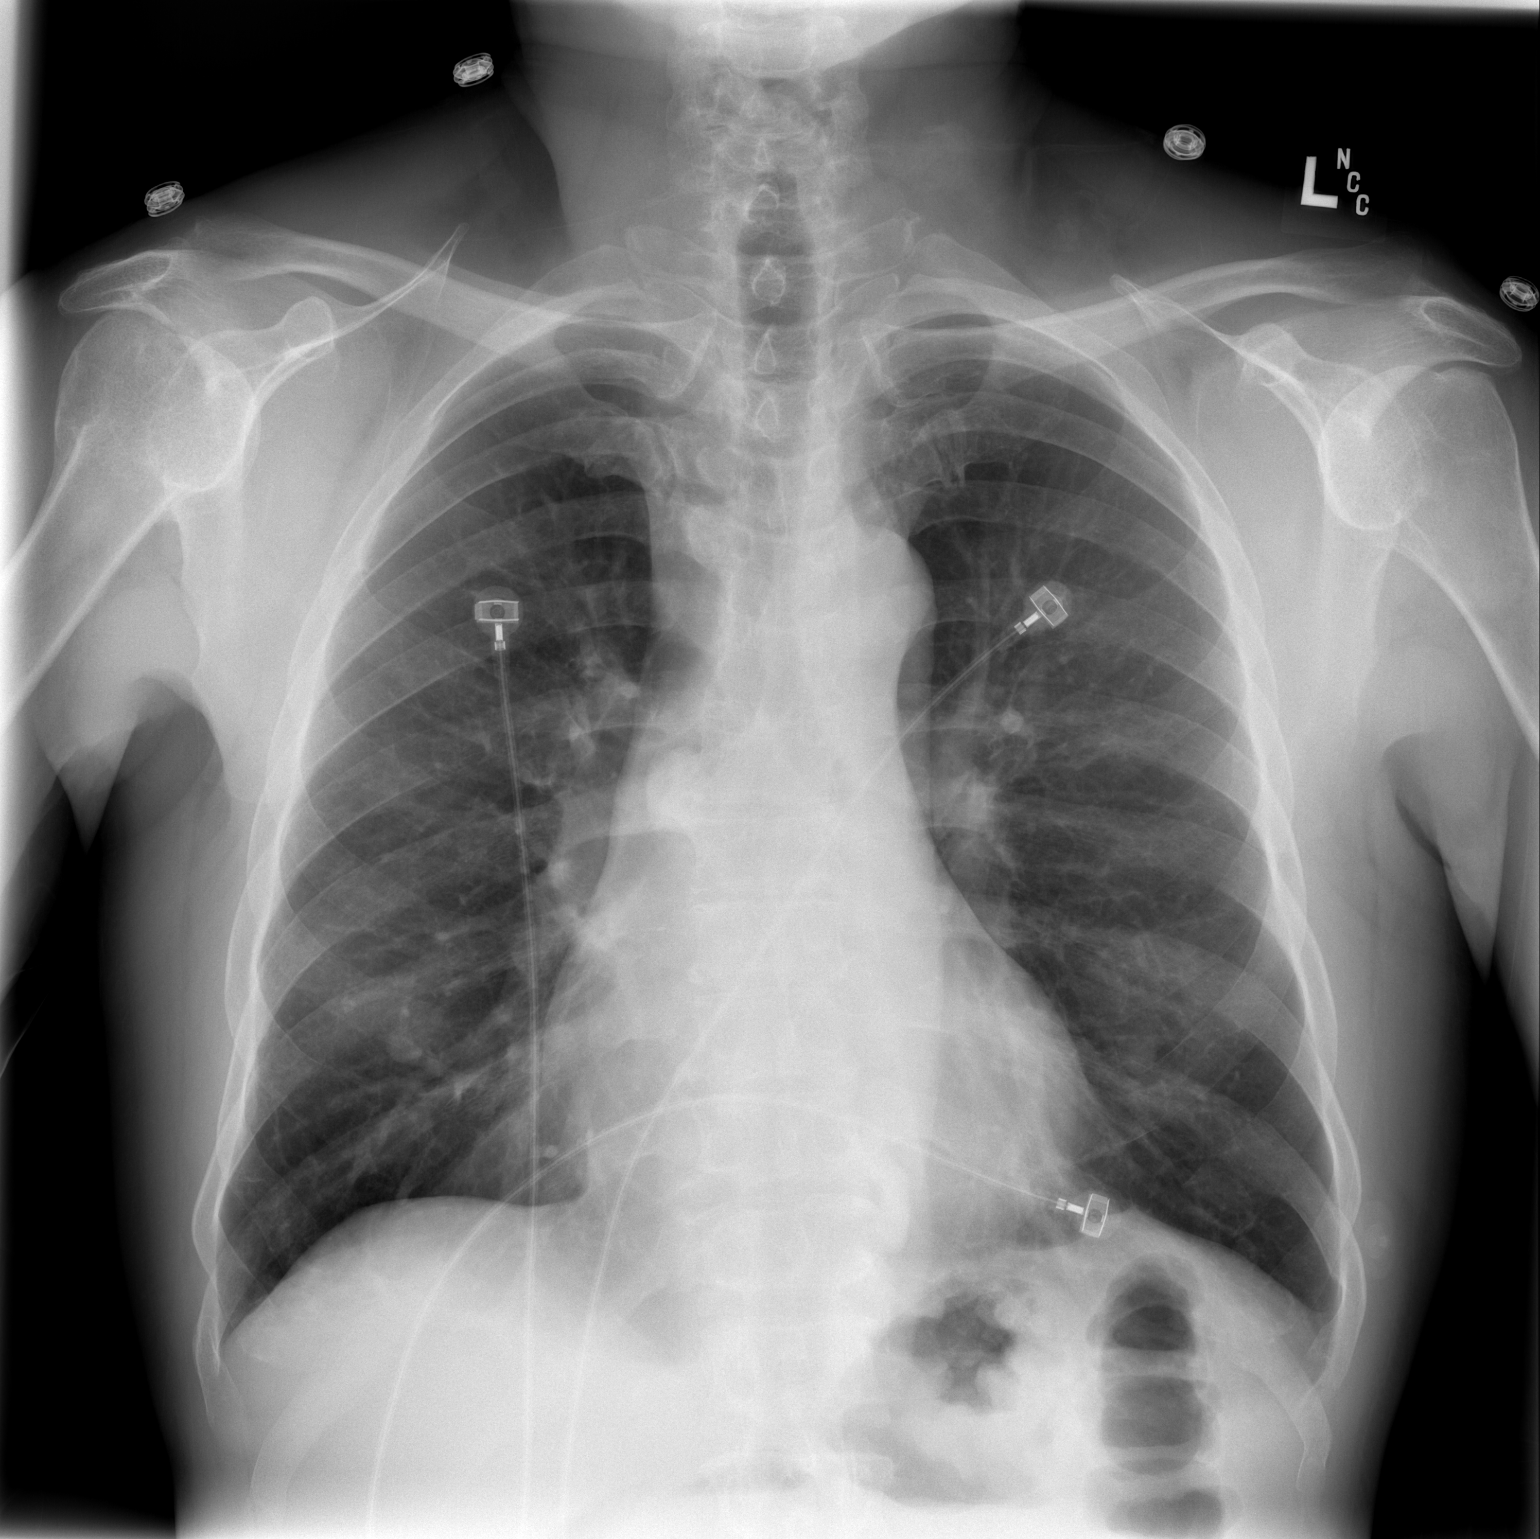

[w chest lat]
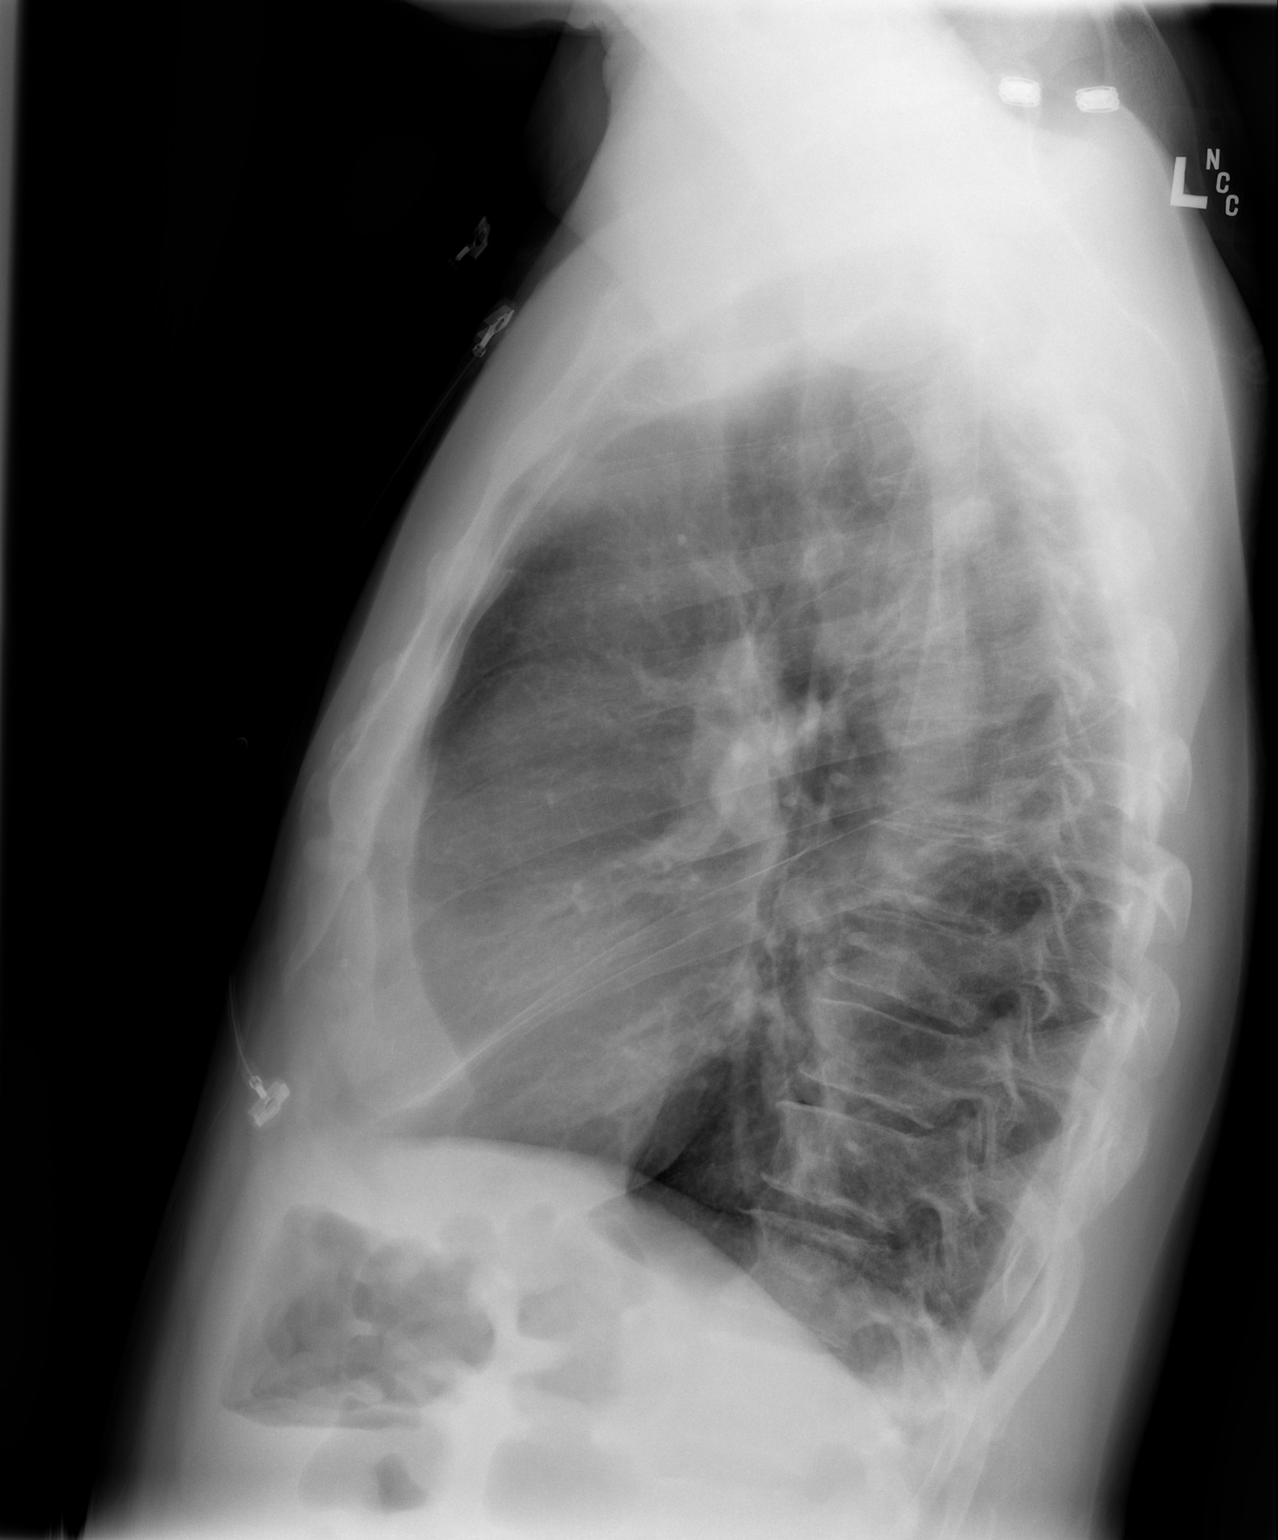

[2 of 2 positions shown; findings below may reference images not displayed]

FINDINGS: Lung volumes at the upper limits of normal.  Mild
cardiomegaly. Other mediastinal contours are within normal limits.
Visualized tracheal air column is within normal limits.  No
pneumothorax, pulmonary edema or pleural effusion.  No
consolidation.  No confluent pulmonary opacity. No acute osseous
abnormality identified.
IMPRESSION: No acute cardiopulmonary abnormality.

## 2010-05-09 IMAGING — CR DG CHEST 1V PORT
1 series · 1 of 1 positions shown · non-contrast
Comparison: [DATE]

CLINICAL DATA: Anemia and acute renal failure.

PORTABLE CHEST - 1 VIEW

[view not recorded]
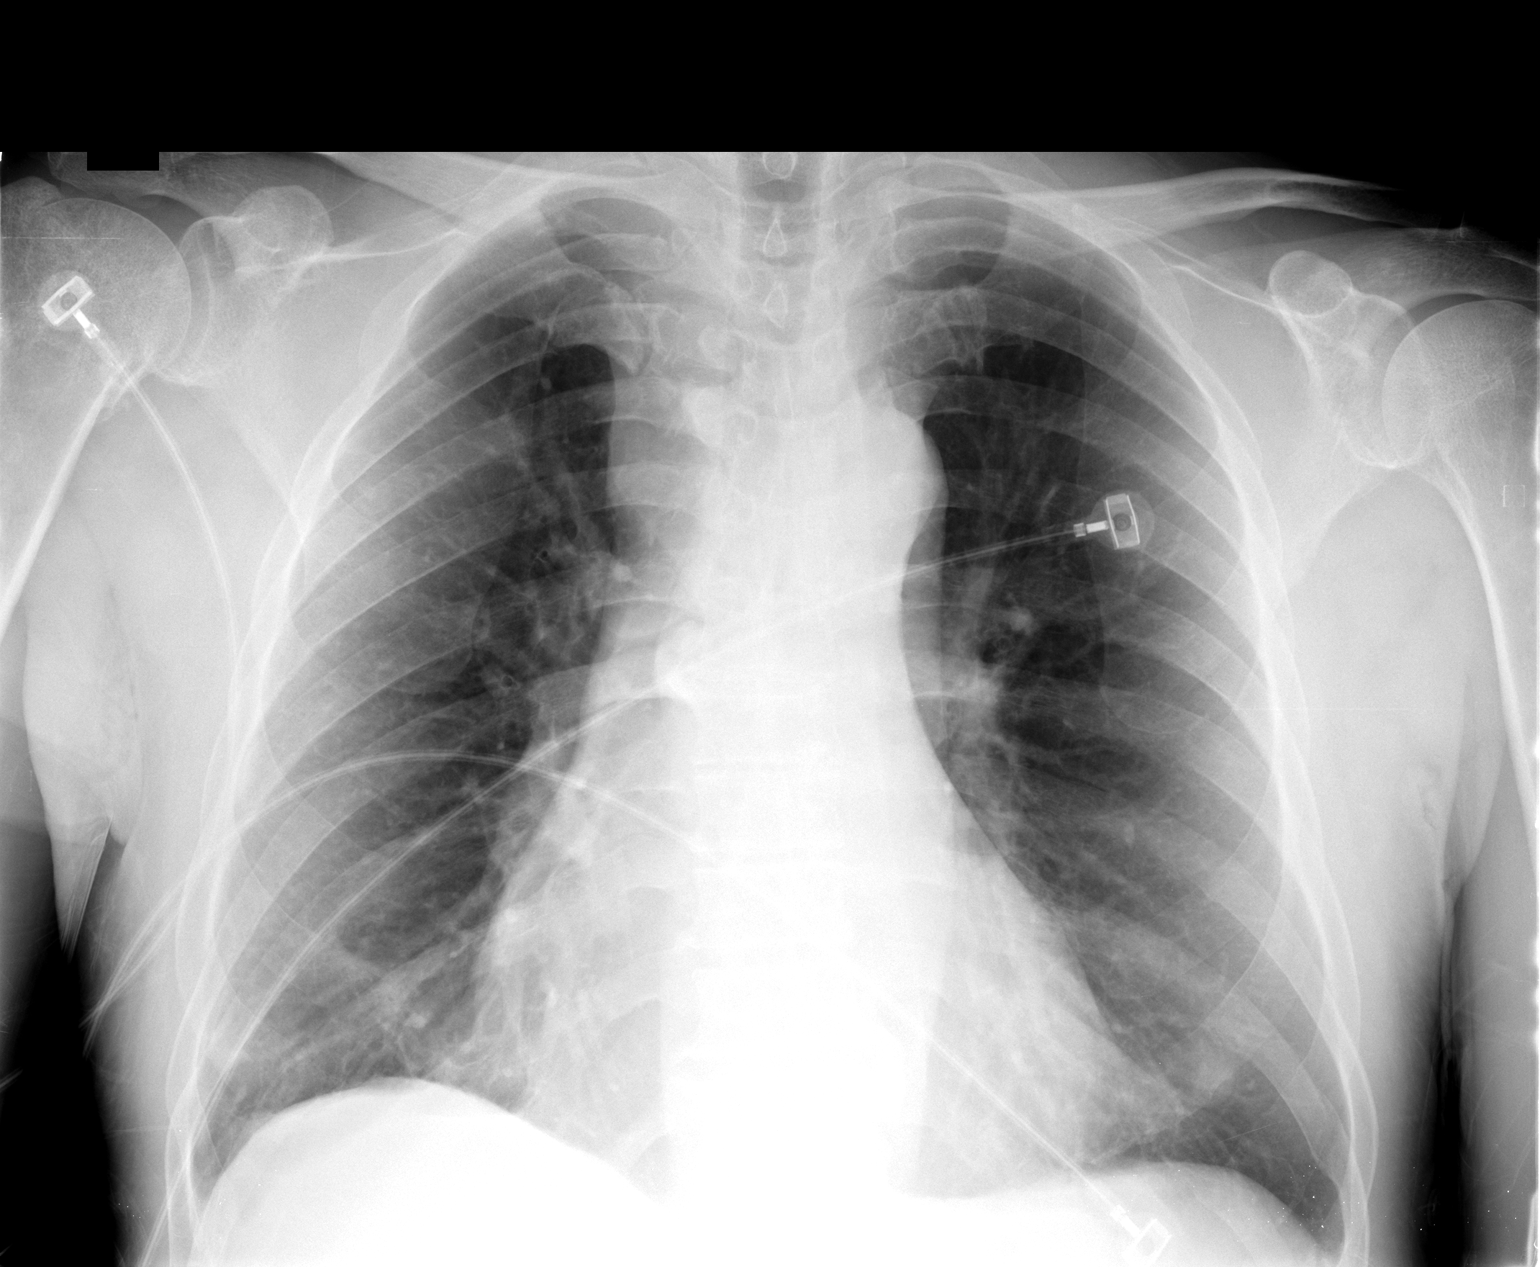

[1 of 1 positions shown; findings below may reference images not displayed]

FINDINGS: Portable view of the chest demonstrates clear lungs.
Heart and mediastinum are within normal limits and the trachea is
midline.  Osseous structures are intact.  Subtle nodular density at
the left lung base is most compatible with a nipple shadow.
IMPRESSION: No acute findings.

Probable left nipple shadow.  This finding could be confirmed with
a follow-up study with nipple markers.

## 2010-05-11 ENCOUNTER — Ambulatory Visit: Payer: Self-pay | Admitting: Oncology

## 2010-05-12 ENCOUNTER — Encounter: Payer: Self-pay | Admitting: Infectious Diseases

## 2010-05-12 IMAGING — CT CT BIOPSY
1 series · 15 of 25 positions shown, 19 images · non-contrast
Comparison: none

CLINICAL DATA: Severe anemia and renal failure.  The patient
requires bone marrow biopsy.

[Series 2: bone marrow bx · axial · 0.38mm/px · z∈[-164,-79]mm · 15 of 25 slices shown, 19 images]
[im 2/25  soft-tissue]
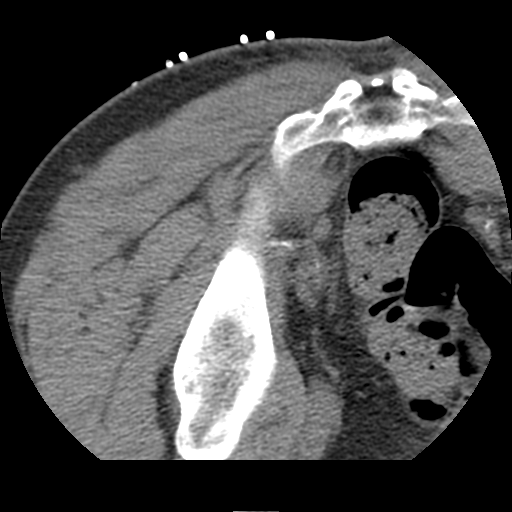
[im 2/25  bone]
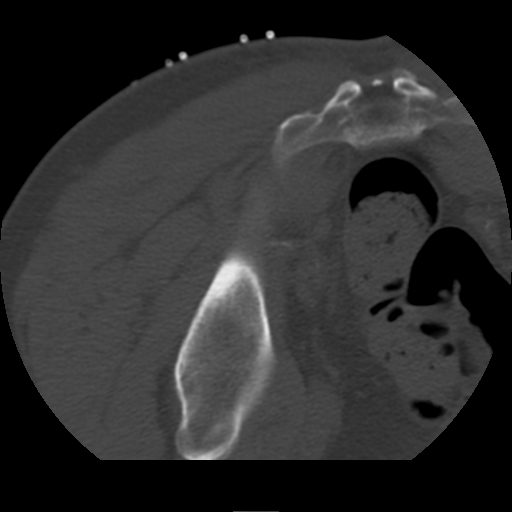
[im 4/25  soft-tissue]
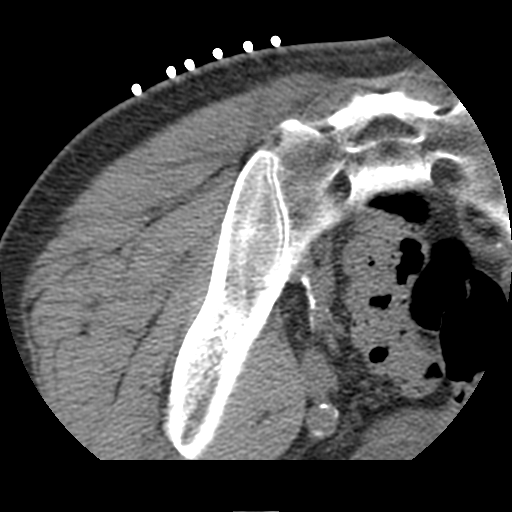
[im 6/25  soft-tissue]
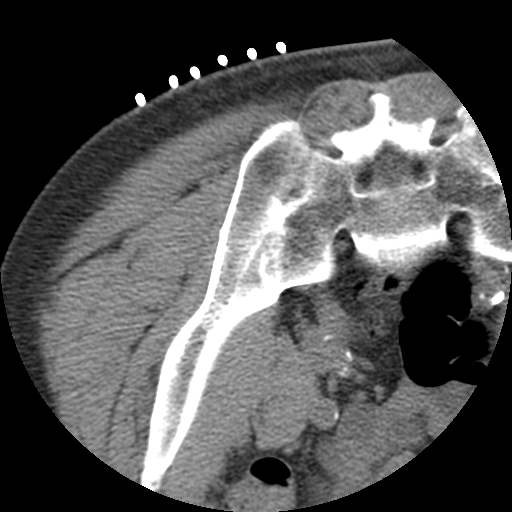
[im 8/25  soft-tissue]
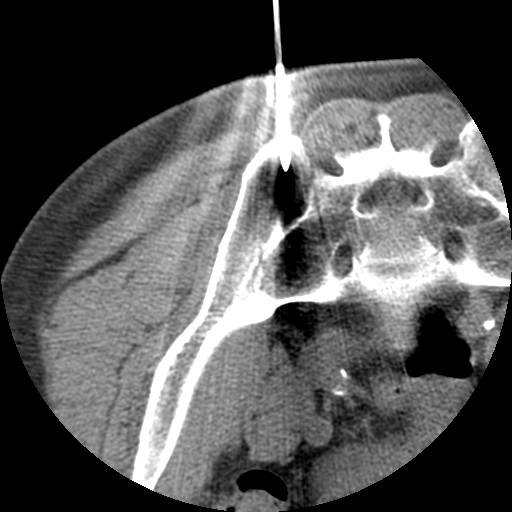
[im 9/25  soft-tissue]
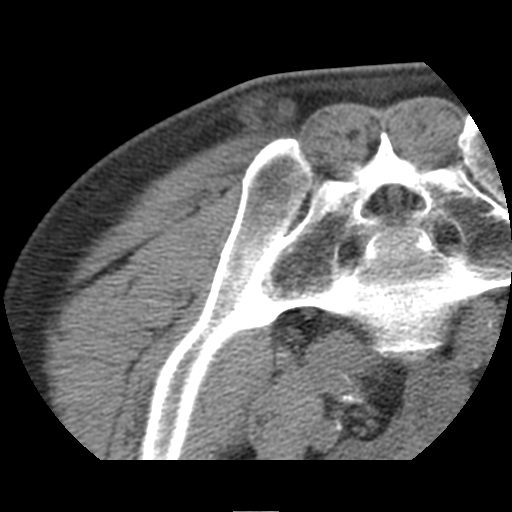
[im 11/25  soft-tissue]
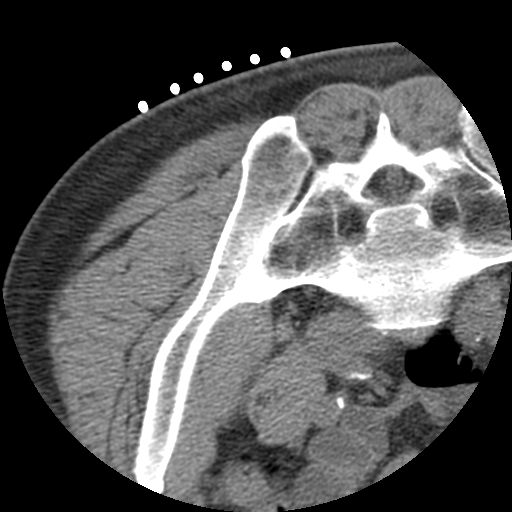
[im 13/25  soft-tissue]
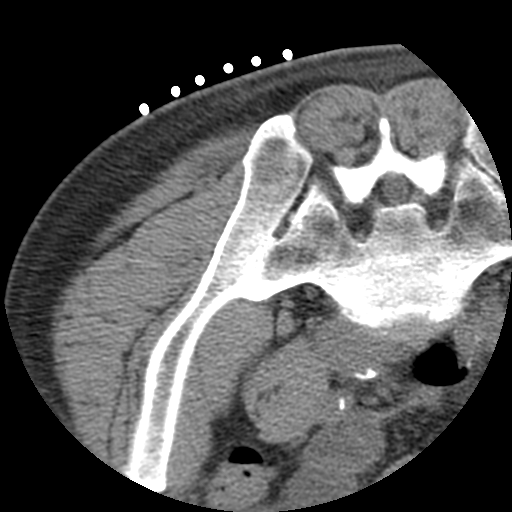
[im 15/25  soft-tissue]
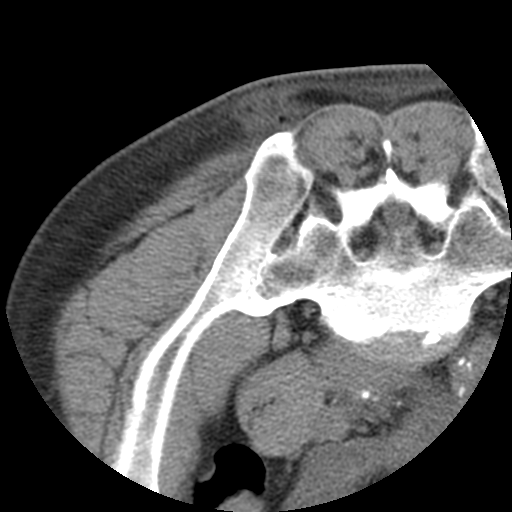
[im 17/25  soft-tissue]
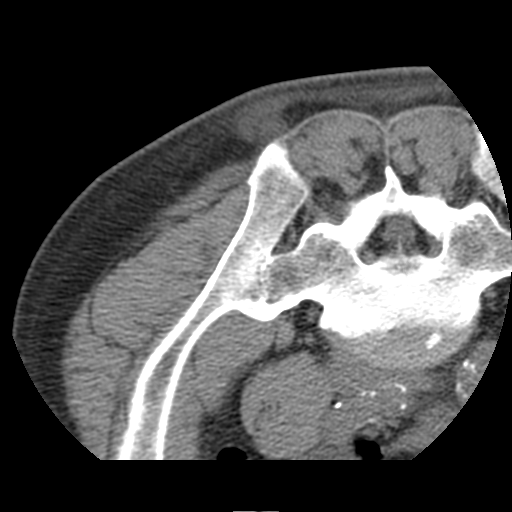
[im 17/25  bone]
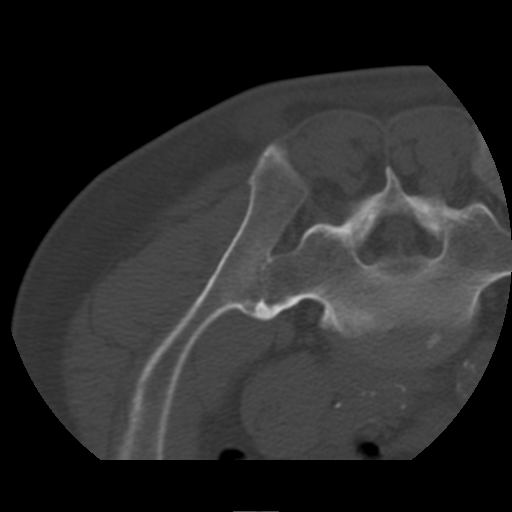
[im 18/25  soft-tissue]
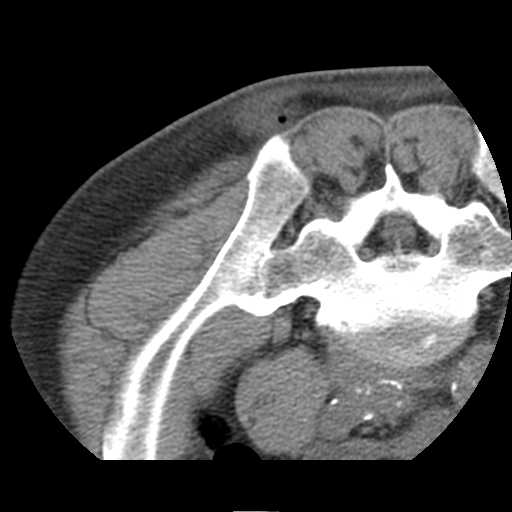
[im 20/25  soft-tissue]
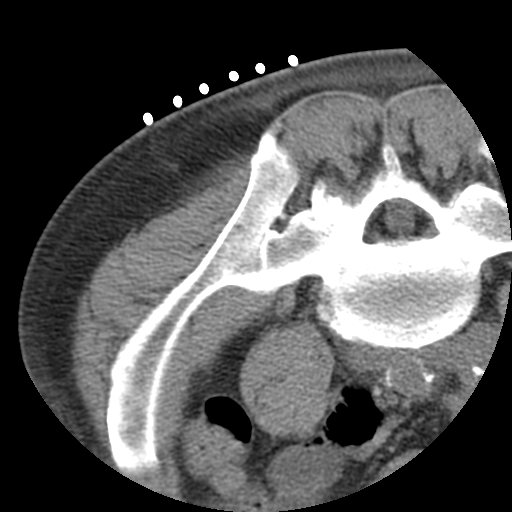
[im 21/25  lung]
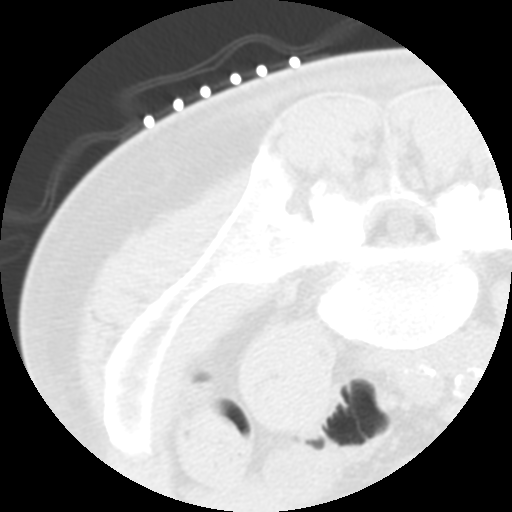
[im 22/25  soft-tissue]
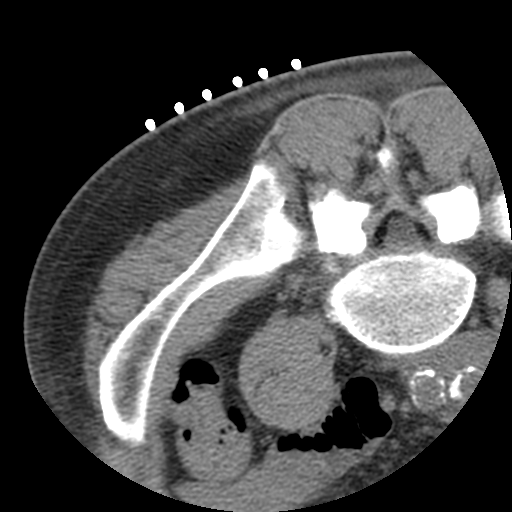
[im 22/25  lung]
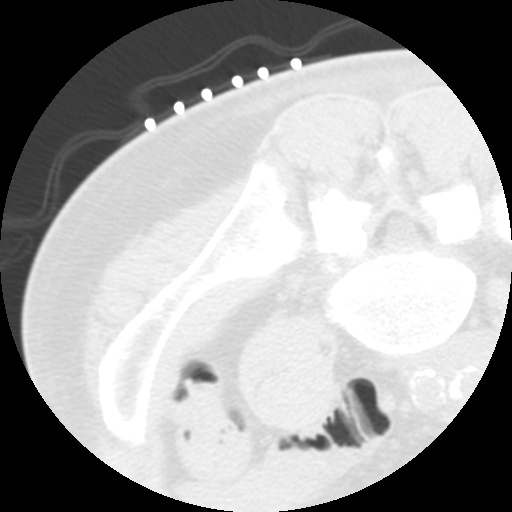
[im 23/25  lung]
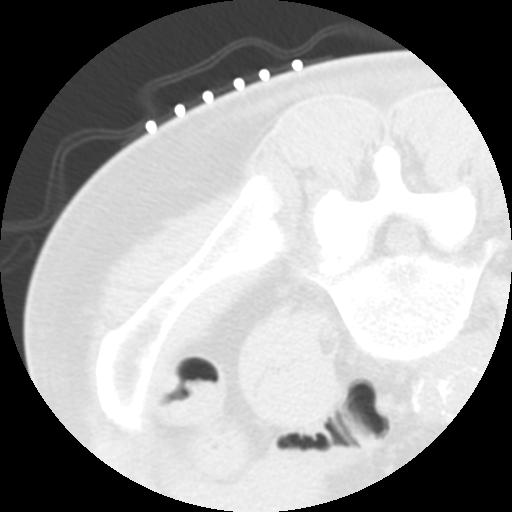
[im 24/25  soft-tissue]
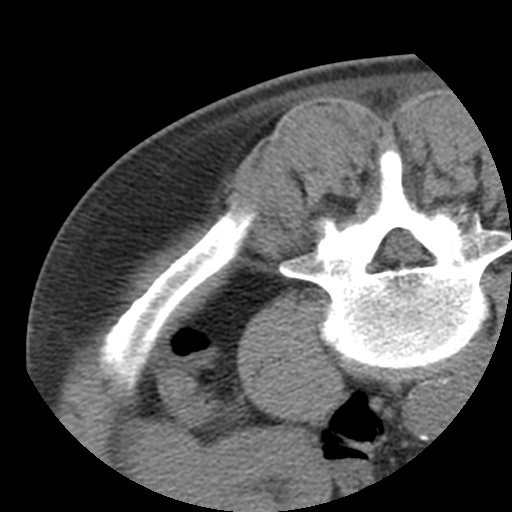
[im 24/25  lung]
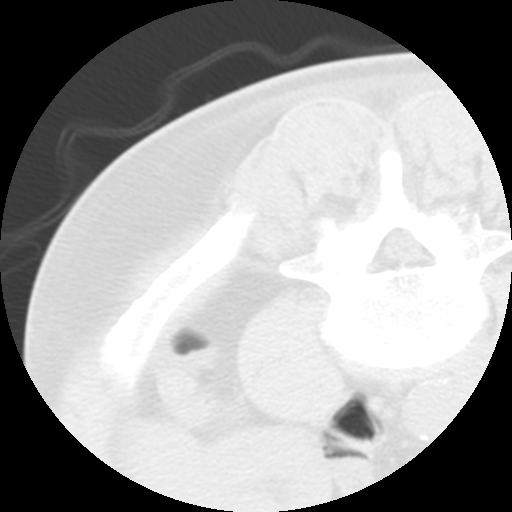

[15 of 25 positions shown; findings below may reference images not displayed]

CT GUIDED ASPIRATE AND CORE BIOPSY OF LEFT ILIAC BONE MARROW

Sedation: Versed 3.0 mg IV, Fentanyl 150 mcg IV

Total Moderate Sedation Time: 10 minutes.

Procedure:  The procedure risks, benefits, and alternatives were
explained to the patient.  Questions regarding the procedure were
encouraged and answered.  The patient understands and consents to
the procedure.

The left gluteal region was prepped with Betadine.  Sterile gown
and sterile gloves were used for the procedure.  Local anesthesia
was provided with 1% Lidocaine.

Under CT guidance, an 11 gauge bone cutting needle was advanced
from a posterior approach into the left iliac bone.  Needle
positioning was confirmed with CT.  Initial non heparinized and
heparinized aspirate samples were obtained of bone marrow.

Core biopsy was performed with coaxial placement of a 14 gauge core
biopsy needle.  Multiple core samples of the marrow were obtained.

Complications: None
FINDINGS: Inspection of initial aspirate did reveal visible
particles.  Intact core biopsy samples were able to be obtained.
IMPRESSION: CT guided bone marrow biopsy of left posterior iliac bone with both
aspirate and core samples obtained.

## 2010-05-14 ENCOUNTER — Ambulatory Visit: Payer: Self-pay | Admitting: Oncology

## 2010-09-21 LAB — CBC
HCT: 25.5 % — ABNORMAL LOW (ref 39.0–52.0)
HCT: 25.6 % — ABNORMAL LOW (ref 39.0–52.0)
HCT: 26 % — ABNORMAL LOW (ref 39.0–52.0)
HCT: 26.4 % — ABNORMAL LOW (ref 39.0–52.0)
HCT: 26.4 % — ABNORMAL LOW (ref 39.0–52.0)
HCT: 26.9 % — ABNORMAL LOW (ref 39.0–52.0)
HCT: 27.1 % — ABNORMAL LOW (ref 39.0–52.0)
HCT: 28.8 % — ABNORMAL LOW (ref 39.0–52.0)
HCT: 30.3 % — ABNORMAL LOW (ref 39.0–52.0)
Hemoglobin: 10 g/dL — ABNORMAL LOW (ref 13.0–17.0)
Hemoglobin: 10.1 g/dL — ABNORMAL LOW (ref 13.0–17.0)
Hemoglobin: 8.5 g/dL — ABNORMAL LOW (ref 13.0–17.0)
Hemoglobin: 8.5 g/dL — ABNORMAL LOW (ref 13.0–17.0)
Hemoglobin: 9 g/dL — ABNORMAL LOW (ref 13.0–17.0)
Hemoglobin: 9.1 g/dL — ABNORMAL LOW (ref 13.0–17.0)
Hemoglobin: 9.2 g/dL — ABNORMAL LOW (ref 13.0–17.0)
Hemoglobin: 9.8 g/dL — ABNORMAL LOW (ref 13.0–17.0)
MCH: 30 pg (ref 26.0–34.0)
MCH: 30.3 pg (ref 26.0–34.0)
MCH: 30.4 pg (ref 26.0–34.0)
MCH: 30.5 pg (ref 26.0–34.0)
MCH: 30.8 pg (ref 26.0–34.0)
MCH: 30.8 pg (ref 26.0–34.0)
MCH: 30.9 pg (ref 26.0–34.0)
MCH: 31.1 pg (ref 26.0–34.0)
MCH: 31.3 pg (ref 26.0–34.0)
MCHC: 31.5 g/dL (ref 30.0–36.0)
MCHC: 32.7 g/dL (ref 30.0–36.0)
MCHC: 33.3 g/dL (ref 30.0–36.0)
MCHC: 33.3 g/dL (ref 30.0–36.0)
MCHC: 33.5 g/dL (ref 30.0–36.0)
MCHC: 33.6 g/dL (ref 30.0–36.0)
MCHC: 33.9 g/dL (ref 30.0–36.0)
MCHC: 34 g/dL (ref 30.0–36.0)
MCV: 90.6 fL (ref 78.0–100.0)
MCV: 90.9 fL (ref 78.0–100.0)
MCV: 91.3 fL (ref 78.0–100.0)
MCV: 91.4 fL (ref 78.0–100.0)
MCV: 92.3 fL (ref 78.0–100.0)
MCV: 92.4 fL (ref 78.0–100.0)
MCV: 93.1 fL (ref 78.0–100.0)
Platelets: 119 10*3/uL — ABNORMAL LOW (ref 150–400)
Platelets: 125 10*3/uL — ABNORMAL LOW (ref 150–400)
Platelets: 128 10*3/uL — ABNORMAL LOW (ref 150–400)
Platelets: 131 10*3/uL — ABNORMAL LOW (ref 150–400)
Platelets: 137 10*3/uL — ABNORMAL LOW (ref 150–400)
Platelets: 145 10*3/uL — ABNORMAL LOW (ref 150–400)
Platelets: 156 10*3/uL (ref 150–400)
Platelets: 195 10*3/uL (ref 150–400)
RBC: 2.79 MIL/uL — ABNORMAL LOW (ref 4.22–5.81)
RBC: 2.86 MIL/uL — ABNORMAL LOW (ref 4.22–5.81)
RBC: 2.89 MIL/uL — ABNORMAL LOW (ref 4.22–5.81)
RBC: 2.97 MIL/uL — ABNORMAL LOW (ref 4.22–5.81)
RBC: 2.97 MIL/uL — ABNORMAL LOW (ref 4.22–5.81)
RBC: 2.98 MIL/uL — ABNORMAL LOW (ref 4.22–5.81)
RBC: 3.15 MIL/uL — ABNORMAL LOW (ref 4.22–5.81)
RBC: 3.28 MIL/uL — ABNORMAL LOW (ref 4.22–5.81)
RDW: 17.9 % — ABNORMAL HIGH (ref 11.5–15.5)
RDW: 17.9 % — ABNORMAL HIGH (ref 11.5–15.5)
RDW: 17.9 % — ABNORMAL HIGH (ref 11.5–15.5)
RDW: 18 % — ABNORMAL HIGH (ref 11.5–15.5)
RDW: 18 % — ABNORMAL HIGH (ref 11.5–15.5)
RDW: 18 % — ABNORMAL HIGH (ref 11.5–15.5)
RDW: 18.2 % — ABNORMAL HIGH (ref 11.5–15.5)
RDW: 18.2 % — ABNORMAL HIGH (ref 11.5–15.5)
WBC: 3.8 10*3/uL — ABNORMAL LOW (ref 4.0–10.5)
WBC: 4 10*3/uL (ref 4.0–10.5)
WBC: 4 10*3/uL (ref 4.0–10.5)
WBC: 4.1 10*3/uL (ref 4.0–10.5)
WBC: 4.2 10*3/uL (ref 4.0–10.5)
WBC: 4.2 10*3/uL (ref 4.0–10.5)
WBC: 4.3 10*3/uL (ref 4.0–10.5)

## 2010-09-21 LAB — CHROMOSOME ANALYSIS, BONE MARROW

## 2010-09-21 LAB — BASIC METABOLIC PANEL
BUN: 23 mg/dL (ref 6–23)
BUN: 32 mg/dL — ABNORMAL HIGH (ref 6–23)
CO2: 17 mEq/L — ABNORMAL LOW (ref 19–32)
CO2: 21 mEq/L (ref 19–32)
Calcium: 7.9 mg/dL — ABNORMAL LOW (ref 8.4–10.5)
Calcium: 8.2 mg/dL — ABNORMAL LOW (ref 8.4–10.5)
Chloride: 116 mEq/L — ABNORMAL HIGH (ref 96–112)
Chloride: 118 mEq/L — ABNORMAL HIGH (ref 96–112)
Creatinine, Ser: 2.97 mg/dL — ABNORMAL HIGH (ref 0.4–1.5)
Creatinine, Ser: 3.27 mg/dL — ABNORMAL HIGH (ref 0.4–1.5)
Creatinine, Ser: 3.38 mg/dL — ABNORMAL HIGH (ref 0.4–1.5)
GFR calc Af Amer: 23 mL/min — ABNORMAL LOW (ref >60)
GFR calc Af Amer: 24 mL/min — ABNORMAL LOW (ref >60)
GFR calc non Af Amer: 19 mL/min — ABNORMAL LOW (ref >60)
GFR calc non Af Amer: 20 mL/min — ABNORMAL LOW (ref >60)
Glucose, Bld: 95 mg/dL (ref 70–99)
Glucose, Bld: 98 mg/dL (ref 70–99)
Potassium: 3.8 mEq/L (ref 3.5–5.1)
Potassium: 4.1 mEq/L (ref 3.5–5.1)
Sodium: 138 mEq/L (ref 135–145)
Sodium: 139 mEq/L (ref 135–145)

## 2010-09-21 LAB — PROTEIN, URINE, 24 HOUR
Collection Interval-UPROT: 24 hours
Protein, 24H Urine: 1957 mg/d — ABNORMAL HIGH (ref 50–100)
Urine Total Volume-UPROT: 2150 mL

## 2010-09-21 LAB — RHEUMATOID FACTOR: Rheumatoid fact SerPl-aCnc: 62 IU/mL — ABNORMAL HIGH (ref 0–20)

## 2010-09-21 LAB — CRYOGLOBULIN

## 2010-09-21 LAB — SAVE SMEAR

## 2010-09-21 LAB — BONE MARROW EXAM

## 2010-09-21 LAB — TSH: TSH: 2.289 u[IU]/mL (ref 0.350–4.500)

## 2010-09-21 LAB — GLUCOSE, CAPILLARY

## 2010-09-22 LAB — CBC
HCT: 15.5 % — ABNORMAL LOW (ref 39.0–52.0)
HCT: 22.7 % — ABNORMAL LOW (ref 39.0–52.0)
HCT: 27.3 % — ABNORMAL LOW (ref 39.0–52.0)
HCT: 28.4 % — ABNORMAL LOW (ref 39.0–52.0)
HCT: 29.6 % — ABNORMAL LOW (ref 39.0–52.0)
HCT: 30.1 % — ABNORMAL LOW (ref 39.0–52.0)
HCT: 31.8 % — ABNORMAL LOW (ref 39.0–52.0)
Hemoglobin: 10 g/dL — ABNORMAL LOW (ref 13.0–17.0)
Hemoglobin: 10 g/dL — ABNORMAL LOW (ref 13.0–17.0)
Hemoglobin: 10.6 g/dL — ABNORMAL LOW (ref 13.0–17.0)
Hemoglobin: 6 g/dL — CL (ref 13.0–17.0)
Hemoglobin: 7.4 g/dL — ABNORMAL LOW (ref 13.0–17.0)
Hemoglobin: 7.7 g/dL — ABNORMAL LOW (ref 13.0–17.0)
Hemoglobin: 9.3 g/dL — ABNORMAL LOW (ref 13.0–17.0)
Hemoglobin: 9.5 g/dL — ABNORMAL LOW (ref 13.0–17.0)
Hemoglobin: 9.7 g/dL — ABNORMAL LOW (ref 13.0–17.0)
MCH: 30.4 pg (ref 26.0–34.0)
MCH: 30.7 pg (ref 26.0–34.0)
MCH: 30.7 pg (ref 26.0–34.0)
MCH: 30.8 pg (ref 26.0–34.0)
MCH: 31.1 pg (ref 26.0–34.0)
MCH: 31.6 pg (ref 26.0–34.0)
MCH: 31.6 pg (ref 26.0–34.0)
MCH: 32.2 pg (ref 26.0–34.0)
MCHC: 31.6 g/dL (ref 30.0–36.0)
MCHC: 32.9 g/dL (ref 30.0–36.0)
MCHC: 32.9 g/dL (ref 30.0–36.0)
MCHC: 33.3 g/dL (ref 30.0–36.0)
MCHC: 33.8 g/dL (ref 30.0–36.0)
MCHC: 33.9 g/dL (ref 30.0–36.0)
MCHC: 34.1 g/dL (ref 30.0–36.0)
MCHC: 34.2 g/dL (ref 30.0–36.0)
MCV: 102 fL — ABNORMAL HIGH (ref 78.0–100.0)
MCV: 90.8 fL (ref 78.0–100.0)
MCV: 90.9 fL (ref 78.0–100.0)
MCV: 91.3 fL (ref 78.0–100.0)
MCV: 93 fL (ref 78.0–100.0)
MCV: 98.4 fL (ref 78.0–100.0)
Platelets: 122 10*3/uL — ABNORMAL LOW (ref 150–400)
Platelets: 122 10*3/uL — ABNORMAL LOW (ref 150–400)
Platelets: 126 10*3/uL — ABNORMAL LOW (ref 150–400)
Platelets: 158 10*3/uL (ref 150–400)
RBC: 1.9 MIL/uL — ABNORMAL LOW (ref 4.22–5.81)
RBC: 2.44 MIL/uL — ABNORMAL LOW (ref 4.22–5.81)
RBC: 3.26 MIL/uL — ABNORMAL LOW (ref 4.22–5.81)
RBC: 3.31 MIL/uL — ABNORMAL LOW (ref 4.22–5.81)
RBC: 3.45 MIL/uL — ABNORMAL LOW (ref 4.22–5.81)
RDW: 16.4 % — ABNORMAL HIGH (ref 11.5–15.5)
RDW: 18 % — ABNORMAL HIGH (ref 11.5–15.5)
RDW: 18.1 % — ABNORMAL HIGH (ref 11.5–15.5)
RDW: 18.1 % — ABNORMAL HIGH (ref 11.5–15.5)
RDW: 18.9 % — ABNORMAL HIGH (ref 11.5–15.5)
WBC: 4.3 10*3/uL (ref 4.0–10.5)
WBC: 4.6 10*3/uL (ref 4.0–10.5)
WBC: 5.4 10*3/uL (ref 4.0–10.5)

## 2010-09-22 LAB — RETICULOCYTES
RBC.: 2.4 MIL/uL — ABNORMAL LOW (ref 4.22–5.81)
Retic Count, Absolute: 79.2 10*3/uL (ref 19.0–186.0)
Retic Ct Pct: 3.3 % — ABNORMAL HIGH (ref 0.4–3.1)

## 2010-09-22 LAB — CROSSMATCH
Unit division: 0
Unit division: 0

## 2010-09-22 LAB — BASIC METABOLIC PANEL
BUN: 33 mg/dL — ABNORMAL HIGH (ref 6–23)
BUN: 40 mg/dL — ABNORMAL HIGH (ref 6–23)
BUN: 41 mg/dL — ABNORMAL HIGH (ref 6–23)
BUN: 42 mg/dL — ABNORMAL HIGH (ref 6–23)
CO2: 16 mEq/L — ABNORMAL LOW (ref 19–32)
CO2: 16 mEq/L — ABNORMAL LOW (ref 19–32)
CO2: 16 mEq/L — ABNORMAL LOW (ref 19–32)
CO2: 17 mEq/L — ABNORMAL LOW (ref 19–32)
CO2: 17 mEq/L — ABNORMAL LOW (ref 19–32)
CO2: 18 mEq/L — ABNORMAL LOW (ref 19–32)
Calcium: 7.6 mg/dL — ABNORMAL LOW (ref 8.4–10.5)
Calcium: 7.7 mg/dL — ABNORMAL LOW (ref 8.4–10.5)
Calcium: 8 mg/dL — ABNORMAL LOW (ref 8.4–10.5)
Calcium: 8.2 mg/dL — ABNORMAL LOW (ref 8.4–10.5)
Chloride: 119 mEq/L — ABNORMAL HIGH (ref 96–112)
Chloride: 120 mEq/L — ABNORMAL HIGH (ref 96–112)
Chloride: 120 mEq/L — ABNORMAL HIGH (ref 96–112)
Chloride: 120 mEq/L — ABNORMAL HIGH (ref 96–112)
Creatinine, Ser: 3.02 mg/dL — ABNORMAL HIGH (ref 0.4–1.5)
Creatinine, Ser: 3.31 mg/dL — ABNORMAL HIGH (ref 0.4–1.5)
Creatinine, Ser: 3.42 mg/dL — ABNORMAL HIGH (ref 0.4–1.5)
GFR calc Af Amer: 23 mL/min — ABNORMAL LOW (ref >60)
GFR calc Af Amer: 24 mL/min — ABNORMAL LOW (ref >60)
GFR calc Af Amer: 25 mL/min — ABNORMAL LOW (ref >60)
GFR calc Af Amer: 26 mL/min — ABNORMAL LOW (ref >60)
GFR calc non Af Amer: 19 mL/min — ABNORMAL LOW (ref >60)
GFR calc non Af Amer: 19 mL/min — ABNORMAL LOW (ref >60)
GFR calc non Af Amer: 22 mL/min — ABNORMAL LOW (ref >60)
Glucose, Bld: 101 mg/dL — ABNORMAL HIGH (ref 70–99)
Glucose, Bld: 108 mg/dL — ABNORMAL HIGH (ref 70–99)
Glucose, Bld: 83 mg/dL (ref 70–99)
Glucose, Bld: 89 mg/dL (ref 70–99)
Glucose, Bld: 92 mg/dL (ref 70–99)
Potassium: 4.1 mEq/L (ref 3.5–5.1)
Potassium: 4.3 mEq/L (ref 3.5–5.1)
Potassium: 4.7 mEq/L (ref 3.5–5.1)
Potassium: 4.9 mEq/L (ref 3.5–5.1)
Potassium: 5 mEq/L (ref 3.5–5.1)
Sodium: 136 mEq/L (ref 135–145)
Sodium: 136 mEq/L (ref 135–145)
Sodium: 138 mEq/L (ref 135–145)
Sodium: 140 mEq/L (ref 135–145)
Sodium: 141 mEq/L (ref 135–145)

## 2010-09-22 LAB — URINALYSIS, MICROSCOPIC ONLY
Bilirubin Urine: NEGATIVE
Glucose, UA: NEGATIVE mg/dL
Hgb urine dipstick: NEGATIVE
Ketones, ur: NEGATIVE mg/dL
Nitrite: NEGATIVE
Protein, ur: 100 mg/dL — AB
Specific Gravity, Urine: 1.009 (ref 1.005–1.030)
Urobilinogen, UA: 0.2 mg/dL (ref 0.0–1.0)
pH: 6 (ref 5.0–8.0)

## 2010-09-22 LAB — HEPATIC FUNCTION PANEL
ALT: 65 U/L — ABNORMAL HIGH (ref 0–53)
AST: 61 U/L — ABNORMAL HIGH (ref 0–37)
Albumin: 2.5 g/dL — ABNORMAL LOW (ref 3.5–5.2)
Alkaline Phosphatase: 60 U/L (ref 39–117)
Bilirubin, Direct: <0.1 mg/dL (ref 0.0–0.3)
Total Bilirubin: 0.5 mg/dL (ref 0.3–1.2)
Total Protein: 5.8 g/dL — ABNORMAL LOW (ref 6.0–8.3)

## 2010-09-22 LAB — HCV RNA QUANT
HCV Quantitative Log: 5.51 {Log} — ABNORMAL HIGH (ref <1.63)
HCV Quantitative: 327000 IU/mL — ABNORMAL HIGH (ref <43)

## 2010-09-22 LAB — URINALYSIS, ROUTINE W REFLEX MICROSCOPIC
Bilirubin Urine: NEGATIVE
Ketones, ur: NEGATIVE mg/dL
Nitrite: NEGATIVE
pH: 6 (ref 5.0–8.0)

## 2010-09-22 LAB — TECHNOLOGIST SMEAR REVIEW

## 2010-09-22 LAB — POCT I-STAT, CHEM 8
BUN: 47 mg/dL — ABNORMAL HIGH (ref 6–23)
Calcium, Ion: 1.22 mmol/L (ref 1.12–1.32)
Hemoglobin: 5.4 g/dL — CL (ref 13.0–17.0)
Sodium: 143 mEq/L (ref 135–145)
TCO2: 15 mmol/L (ref 0–100)

## 2010-09-22 LAB — LACTATE DEHYDROGENASE: LDH: 123 U/L (ref 94–250)

## 2010-09-22 LAB — RAPID URINE DRUG SCREEN, HOSP PERFORMED
Cocaine: NOT DETECTED
Opiates: NOT DETECTED
Tetrahydrocannabinol: NOT DETECTED

## 2010-09-22 LAB — DIFFERENTIAL
Basophils Absolute: 0 10*3/uL (ref 0.0–0.1)
Basophils Relative: 0 % (ref 0–1)
Eosinophils Absolute: 0.1 10*3/uL (ref 0.0–0.7)
Eosinophils Relative: 2 % (ref 0–5)
Lymphocytes Relative: 38 % (ref 12–46)
Monocytes Absolute: 0.6 10*3/uL (ref 0.1–1.0)

## 2010-09-22 LAB — HEMOCCULT GUIAC POC 1CARD (OFFICE): Fecal Occult Bld: NEGATIVE

## 2010-09-22 LAB — PREPARE RBC (CROSSMATCH)

## 2010-09-22 LAB — MAGNESIUM: Magnesium: 1.8 mg/dL (ref 1.5–2.5)

## 2010-09-22 LAB — SODIUM, URINE, RANDOM: Sodium, Ur: 99 mEq/L

## 2010-09-22 LAB — IRON AND TIBC: UIBC: 104 ug/dL

## 2010-09-22 LAB — FERRITIN: Ferritin: 59 ng/mL (ref 22–322)

## 2010-09-22 LAB — URINE MICROSCOPIC-ADD ON

## 2010-09-22 LAB — FOLATE: Folate: 16.1 ng/mL

## 2010-10-04 ENCOUNTER — Encounter (INDEPENDENT_AMBULATORY_CARE_PROVIDER_SITE_OTHER): Payer: Self-pay | Admitting: Family Medicine

## 2010-10-04 LAB — CONVERTED CEMR LAB
ALT: 14 units/L (ref 0–53)
AST: 32 units/L (ref 0–37)
Alkaline Phosphatase: 69 units/L (ref 39–117)
Basophils Absolute: 0 10*3/uL (ref 0.0–0.1)
Basophils Relative: 0 % (ref 0–1)
Eosinophils Absolute: 0 10*3/uL (ref 0.0–0.7)
Eosinophils Relative: 1 % (ref 0–5)
Glucose, Bld: 78 mg/dL (ref 70–99)
HCT: 33.4 % — ABNORMAL LOW (ref 39.0–52.0)
MCHC: 32 g/dL (ref 30.0–36.0)
MCV: 99.4 fL (ref 78.0–100.0)
Platelets: 157 10*3/uL (ref 150–400)
RDW: 13.8 % (ref 11.5–15.5)
Sodium: 138 meq/L (ref 135–145)
Total Bilirubin: 0.5 mg/dL (ref 0.3–1.2)
Total Protein: 7.4 g/dL (ref 6.0–8.3)

## 2010-11-01 ENCOUNTER — Other Ambulatory Visit (HOSPITAL_COMMUNITY): Payer: Self-pay | Admitting: Family Medicine

## 2010-11-01 DIAGNOSIS — N189 Chronic kidney disease, unspecified: Secondary | ICD-10-CM

## 2010-11-08 ENCOUNTER — Other Ambulatory Visit (HOSPITAL_COMMUNITY): Payer: Self-pay

## 2010-11-12 ENCOUNTER — Other Ambulatory Visit (HOSPITAL_COMMUNITY): Payer: Self-pay

## 2012-03-05 ENCOUNTER — Encounter: Payer: Self-pay | Admitting: Internal Medicine

## 2012-11-20 DIAGNOSIS — I120 Hypertensive chronic kidney disease with stage 5 chronic kidney disease or end stage renal disease: Secondary | ICD-10-CM | POA: Insufficient documentation

## 2012-11-20 DIAGNOSIS — N2581 Secondary hyperparathyroidism of renal origin: Secondary | ICD-10-CM | POA: Insufficient documentation

## 2012-11-28 ENCOUNTER — Encounter: Payer: Self-pay | Admitting: Vascular Surgery

## 2012-11-28 ENCOUNTER — Other Ambulatory Visit: Payer: Self-pay | Admitting: *Deleted

## 2012-11-28 DIAGNOSIS — N186 End stage renal disease: Secondary | ICD-10-CM

## 2012-11-30 ENCOUNTER — Encounter: Payer: Self-pay | Admitting: Vascular Surgery

## 2012-12-04 ENCOUNTER — Encounter: Payer: Self-pay | Admitting: Vascular Surgery

## 2012-12-04 ENCOUNTER — Ambulatory Visit: Payer: Self-pay | Admitting: Vascular Surgery

## 2012-12-04 ENCOUNTER — Ambulatory Visit (INDEPENDENT_AMBULATORY_CARE_PROVIDER_SITE_OTHER): Payer: Medicaid Other | Admitting: Vascular Surgery

## 2012-12-04 ENCOUNTER — Encounter (INDEPENDENT_AMBULATORY_CARE_PROVIDER_SITE_OTHER): Payer: Medicaid Other | Admitting: *Deleted

## 2012-12-04 VITALS — BP 137/96 | HR 73 | Resp 16 | Ht 68.0 in | Wt 178.0 lb

## 2012-12-04 DIAGNOSIS — N186 End stage renal disease: Secondary | ICD-10-CM

## 2012-12-04 DIAGNOSIS — T82598A Other mechanical complication of other cardiac and vascular devices and implants, initial encounter: Secondary | ICD-10-CM

## 2012-12-04 DIAGNOSIS — M79609 Pain in unspecified limb: Secondary | ICD-10-CM

## 2012-12-04 DIAGNOSIS — I70219 Atherosclerosis of native arteries of extremities with intermittent claudication, unspecified extremity: Secondary | ICD-10-CM | POA: Insufficient documentation

## 2012-12-04 NOTE — Progress Notes (Signed)
[facility]ist of [facility]   Patient name: Raymond [name] MRN: [id number] DOB: May 25, 1956 Sex: male   Referred by: Mercy Moore  Reason for referral:  Chief Complaint  Patient presents with  . ESRD    Eval left forearm AVF   ( ?  Aug,. 2013)  C/O of Some discomfort left forearme  HD  M-W-F    HISTORY OF PRESENT ILLNESS: Patient has today for evaluation of AV access. He is very pleasant 57 year old gentleman who had a left forearm basilic vein transposition done in [place] and 2013. He has recently had the initiation through [facility] or hemodialysis and [place]. He has had difficulty with poor arterial flow in his left forearm AV fistula seen today for further evaluation of this. He does report what sounds like a infiltration at some point initially after placement of his procedure but otherwise is in good accessed through this fistula. That his renal insufficiency related to hypertension.  Past Medical History  Diagnosis Date  . Chronic kidney disease   . Hypertension   . Anemia   . Thyroid disease   . Hepatitis C     Still positive s/p liver biopsy at [facility]  and interferon therapy for 6 months. Most recent lab work was on 10/24/12  . Substance abuse     Past Surgical History  Procedure Laterality Date  . Liver biopsy    . Av fistula placement Left Aug. 2013 ?    History   Social History  . Marital Status: Married    Spouse Name: N/A    Number of Children: N/A  . Years of Education: N/A   Occupational History  . Not on file.   Social History Main Topics  . Smoking status: Former Smoker -- 25 years    Quit date: 08/01/2011  . Smokeless tobacco: Never Used  . Alcohol Use: Yes     Comment: has not used since 07/2011  . Drug Use: Yes    Special: Cocaine, Marijuana     Comment: has not used since 07/2011  . Sexually Active: Not on file   Other Topics Concern  . Not on file   Social History Narrative  . No narrative on file     Family History  Problem Relation Age of Onset  . Diabetes Father   . Hypertension Father     Allergies as of 12/04/2012  . (No Known Allergies)    Current Outpatient Prescriptions on File Prior to Visit  Medication Sig Dispense Refill  . cloNIDine (CATAPRES) 0.1 MG tablet Take 0.1 mg by mouth as needed.      . sevelamer (RENAGEL) 800 MG tablet Take 800 mg by mouth 3 (three) times daily with meals.       No current facility-administered medications on file prior to visit.     REVIEW OF SYSTEMS:  Positives indicated with an "X"  CARDIOVASCULAR:  [ ]  chest pain   [ ]  chest pressure   [ ]  palpitations   [ ]  orthopnea   [ ]  dyspnea on exertion   [ ]  claudication   [ ]  rest pain   [ ]  DVT   [ ]  phlebitis PULMONARY:   [ ]  productive cough   [ ]  asthma   [ ]  wheezing NEUROLOGIC:   [ ]  weakness  [ ]  paresthesias  [ ]  aphasia  [ ]  amaurosis  [ ]  dizziness HEMATOLOGIC:   [ ]  bleeding problems   [ ]  clotting disorders  MUSCULOSKELETAL:  [ ]  joint pain   [ ]  joint swelling GASTROINTESTINAL: [ ]   blood in stool  [ ]   hematemesis GENITOURINARY:  [ ]   dysuria  [ ]   hematuria PSYCHIATRIC:  [ ]  history of major depression INTEGUMENTARY:  [ ]  rashes  [ ]  ulcers CONSTITUTIONAL:  [ ]  fever   [ ]  chills  PHYSICAL EXAMINATION:  General: The patient is a well-nourished male, in no acute distress. Vital signs are BP 137/96  Pulse 73  Resp 16  Ht 5\' 8"  (1.727 m)  Wt 178 lb (80.74 kg)  BMI 27.07 kg/m2  SpO2 100% Pulmonary: There is a good air exchange bilaterally  Abdomen: Soft and non-tender  Musculoskeletal: There are no major deformities.  There is no significant extremity pain. Neurologic: No focal weakness or paresthesias are detected, Skin: There are no ulcer or rashes noted. Psychiatric: The patient has normal affect. Pulse status: 2+ radial pulses bilaterally Forearm AV fistula shows a basilic vein transposition. Mid forearm from the arterial anastomosis on the vein is small by  physical exam and does have an area that feels have some sclerosis with the very pronounced thrill just distal to this. The proximal third of the upper arm has very good maturation and this is where the fistula is being access   VVS Vascular Lab Studies:  Ordered and Independently Reviewed patent basilic transposition. Does have a high level of elevated velocities et cetera centimeters per second below the area is being access.  Impression and Plan:  Visualization with poorly functioning left arm basilic vein transposition. Have discussed this at length the patient. Recommend a shuntogram and possible angioplasty of his venous stenotic segment. This is been arranged as an outpatient with Dr. Trula Slade on 12/11/2012    Zachariah Pavek [facility] [place] Office: [phone]

## 2012-12-06 ENCOUNTER — Other Ambulatory Visit: Payer: Self-pay | Admitting: *Deleted

## 2012-12-10 ENCOUNTER — Encounter (HOSPITAL_COMMUNITY): Payer: Self-pay | Admitting: Pharmacy Technician

## 2012-12-11 ENCOUNTER — Encounter (HOSPITAL_COMMUNITY)
Admission: RE | Disposition: A | Discharge status: Home / Self Care | Payer: Self-pay | Source: Ambulatory Visit | Attending: Surgery

## 2012-12-11 ENCOUNTER — Ambulatory Visit (HOSPITAL_COMMUNITY)
Admission: RE | Admit: 2012-12-11 | Discharge: 2012-12-11 | Disposition: A | Discharge status: Home / Self Care | Payer: Medicaid Other | Source: Ambulatory Visit | Attending: Surgery | Admitting: Surgery

## 2012-12-11 DIAGNOSIS — T82898A Other specified complication of vascular prosthetic devices, implants and grafts, initial encounter: Secondary | ICD-10-CM | POA: Insufficient documentation

## 2012-12-11 DIAGNOSIS — Z87891 Personal history of nicotine dependence: Secondary | ICD-10-CM | POA: Insufficient documentation

## 2012-12-11 DIAGNOSIS — D649 Anemia, unspecified: Secondary | ICD-10-CM | POA: Insufficient documentation

## 2012-12-11 DIAGNOSIS — I871 Compression of vein: Secondary | ICD-10-CM | POA: Insufficient documentation

## 2012-12-11 DIAGNOSIS — B1920 Unspecified viral hepatitis C without hepatic coma: Secondary | ICD-10-CM | POA: Insufficient documentation

## 2012-12-11 DIAGNOSIS — F1411 Cocaine abuse, in remission: Secondary | ICD-10-CM | POA: Insufficient documentation

## 2012-12-11 DIAGNOSIS — F1211 Cannabis abuse, in remission: Secondary | ICD-10-CM | POA: Insufficient documentation

## 2012-12-11 DIAGNOSIS — N186 End stage renal disease: Secondary | ICD-10-CM | POA: Insufficient documentation

## 2012-12-11 DIAGNOSIS — Y832 Surgical operation with anastomosis, bypass or graft as the cause of abnormal reaction of the patient, or of later complication, without mention of misadventure at the time of the procedure: Secondary | ICD-10-CM | POA: Insufficient documentation

## 2012-12-11 DIAGNOSIS — I120 Hypertensive chronic kidney disease with stage 5 chronic kidney disease or end stage renal disease: Secondary | ICD-10-CM | POA: Insufficient documentation

## 2012-12-11 DIAGNOSIS — E079 Disorder of thyroid, unspecified: Secondary | ICD-10-CM | POA: Insufficient documentation

## 2012-12-11 DIAGNOSIS — Z79899 Other long term (current) drug therapy: Secondary | ICD-10-CM | POA: Insufficient documentation

## 2012-12-11 HISTORY — PX: SHUNTOGRAM: SHX5491

## 2012-12-11 LAB — POCT I-STAT, CHEM 8
BUN: 57 mg/dL — ABNORMAL HIGH (ref 6–23)
Creatinine, Ser: 5.8 mg/dL — ABNORMAL HIGH (ref 0.50–1.35)
Glucose, Bld: 94 mg/dL (ref 70–99)
Sodium: 141 mEq/L (ref 135–145)
TCO2: 28 mmol/L (ref 0–100)

## 2012-12-11 SURGERY — ASSESSMENT, SHUNT FUNCTION, WITH CONTRAST RADIOGRAPHIC STUDY
Anesthesia: LOCAL

## 2012-12-11 MED ORDER — METOPROLOL TARTRATE 1 MG/ML IV SOLN: 2.0000 mg | INTRAVENOUS | Status: DC | PRN

## 2012-12-11 MED ORDER — LABETALOL HCL 5 MG/ML IV SOLN: 10.0000 mg | INTRAVENOUS | Status: DC | PRN

## 2012-12-11 MED ORDER — ACETAMINOPHEN 325 MG RE SUPP: 325.0000 mg | RECTAL | Status: DC | PRN

## 2012-12-11 MED ORDER — LIDOCAINE HCL (PF) 1 % IJ SOLN
INTRAMUSCULAR | Status: AC
  Filled 2012-12-11: qty 30

## 2012-12-11 MED ORDER — HEPARIN SODIUM (PORCINE) 1000 UNIT/ML IJ SOLN
INTRAMUSCULAR | Status: AC
  Filled 2012-12-11: qty 1

## 2012-12-11 MED ORDER — PHENOL 1.4 % MT LIQD: 1.0000 | OROMUCOSAL | Status: DC | PRN

## 2012-12-11 MED ORDER — FENTANYL CITRATE 0.05 MG/ML IJ SOLN
INTRAMUSCULAR | Status: AC
  Filled 2012-12-11: qty 2

## 2012-12-11 MED ORDER — HYDRALAZINE HCL 20 MG/ML IJ SOLN: 10.0000 mg | INTRAMUSCULAR | Status: DC | PRN

## 2012-12-11 MED ORDER — GUAIFENESIN-DM 100-10 MG/5ML PO SYRP: 15.0000 mL | ORAL_SOLUTION | ORAL | Status: DC | PRN

## 2012-12-11 MED ORDER — OXYCODONE HCL 5 MG PO TABS: 5.0000 mg | ORAL_TABLET | ORAL | Status: DC | PRN

## 2012-12-11 MED ORDER — ONDANSETRON HCL 4 MG/2ML IJ SOLN: 4.0000 mg | Freq: Four times a day (QID) | INTRAMUSCULAR | Status: DC | PRN

## 2012-12-11 MED ORDER — SODIUM CHLORIDE 0.9 % IJ SOLN: 3.0000 mL | INTRAMUSCULAR | Status: DC | PRN

## 2012-12-11 MED ORDER — ACETAMINOPHEN 325 MG PO TABS: 325.0000 mg | ORAL_TABLET | ORAL | Status: DC | PRN

## 2012-12-11 MED ORDER — ALUM & MAG HYDROXIDE-SIMETH 200-200-20 MG/5ML PO SUSP: 15.0000 mL | ORAL | Status: DC | PRN

## 2012-12-11 MED ORDER — HEPARIN (PORCINE) IN NACL 2-0.9 UNIT/ML-% IJ SOLN
INTRAMUSCULAR | Status: AC
  Filled 2012-12-11: qty 500

## 2012-12-11 NOTE — Interval H&P Note (Signed)
History and Physical Interval Note:  12/11/2012 8:56 AM  Raymond [name]  has presented today for surgery, with the diagnosis of left arm AV fistulo stenosis  The various methods of treatment have been discussed with the patient and family. After consideration of risks, benefits and other options for treatment, the patient has consented to  Procedure(s): SHUNTOGRAM (N/A) as a surgical intervention .  The patient's history has been reviewed, patient examined, no change in status, stable for surgery.  I have reviewed the patient's chart and labs.  Questions were answered to the patient's satisfaction.     BRABHAM IV, V. WELLS

## 2012-12-11 NOTE — H&P (View-Only) (Signed)
Vascular and Vein Specialist of [facility]   Patient name: Raymond [name] MRN: [id number] DOB: [birth year] Sex: male   Referred by: Mercy Moore  Reason for referral:  Chief Complaint  Patient presents with  . ESRD    Eval left forearm AVF   ( ?  Aug,. 2013)  C/O of Some discomfort left forearme  HD  M-W-F    HISTORY OF PRESENT ILLNESS: Patient has today for evaluation of AV access. He is very pleasant 57 year old gentleman who had a left forearm basilic vein transposition done in [place] and 2013. He has recently had the initiation through [facility] or hemodialysis and [place]. He has had difficulty with poor arterial flow in his left forearm AV fistula seen today for further evaluation of this. He does report what sounds like a infiltration at some point initially after placement of his procedure but otherwise is in good accessed through this fistula. That his renal insufficiency related to hypertension.  Past Medical History  Diagnosis Date  . Chronic kidney disease   . Hypertension   . Anemia   . Thyroid disease   . Hepatitis C     Still positive s/p liver biopsy at [facility]  and interferon therapy for 6 months. Most recent lab work was on 10/24/12  . Substance abuse     Past Surgical History  Procedure Laterality Date  . Liver biopsy    . Av fistula placement Left Aug. 2013 ?    History   Social History  . Marital Status: Married    Spouse Name: N/A    Number of Children: N/A  . Years of Education: N/A   Occupational History  . Not on file.   Social History Main Topics  . Smoking status: Former Smoker -- 25 years    Quit date: 08/01/2011  . Smokeless tobacco: Never Used  . Alcohol Use: Yes     Comment: has not used since 07/2011  . Drug Use: Yes    Special: Cocaine, Marijuana     Comment: has not used since 07/2011  . Sexually Active: Not on file   Other Topics Concern  . Not on file   Social History Narrative  . No narrative on file     Family History  Problem Relation Age of Onset  . Diabetes Father   . Hypertension Father     Allergies as of 12/04/2012  . (No Known Allergies)    Current Outpatient Prescriptions on File Prior to Visit  Medication Sig Dispense Refill  . cloNIDine (CATAPRES) 0.1 MG tablet Take 0.1 mg by mouth as needed.      . sevelamer (RENAGEL) 800 MG tablet Take 800 mg by mouth 3 (three) times daily with meals.       No current facility-administered medications on file prior to visit.     REVIEW OF SYSTEMS:  Positives indicated with an "X"  CARDIOVASCULAR:  [ ]  chest pain   [ ]  chest pressure   [ ]  palpitations   [ ]  orthopnea   [ ]  dyspnea on exertion   [ ]  claudication   [ ]  rest pain   [ ]  DVT   [ ]  phlebitis PULMONARY:   [ ]  productive cough   [ ]  asthma   [ ]  wheezing NEUROLOGIC:   [ ]  weakness  [ ]  paresthesias  [ ]  aphasia  [ ]  amaurosis  [ ]  dizziness HEMATOLOGIC:   [ ]  bleeding problems   [ ]  clotting disorders  MUSCULOSKELETAL:  [ ]  joint pain   [ ]  joint swelling GASTROINTESTINAL: [ ]   blood in stool  [ ]   hematemesis GENITOURINARY:  [ ]   dysuria  [ ]   hematuria PSYCHIATRIC:  [ ]  history of major depression INTEGUMENTARY:  [ ]  rashes  [ ]  ulcers CONSTITUTIONAL:  [ ]  fever   [ ]  chills  PHYSICAL EXAMINATION:  General: The patient is a well-nourished male, in no acute distress. Vital signs are BP 137/96  Pulse 73  Resp 16  Ht 5\' 8"  (1.727 m)  Wt 178 lb (80.74 kg)  BMI 27.07 kg/m2  SpO2 100% Pulmonary: There is a good air exchange bilaterally  Abdomen: Soft and non-tender  Musculoskeletal: There are no major deformities.  There is no significant extremity pain. Neurologic: No focal weakness or paresthesias are detected, Skin: There are no ulcer or rashes noted. Psychiatric: The patient has normal affect. Pulse status: 2+ radial pulses bilaterally Forearm AV fistula shows a basilic vein transposition. Mid forearm from the arterial anastomosis on the vein is small by  physical exam and does have an area that feels have some sclerosis with the very pronounced thrill just distal to this. The proximal third of the upper arm has very good maturation and this is where the fistula is being access   VVS Vascular Lab Studies:  Ordered and Independently Reviewed patent basilic transposition. Does have a high level of elevated velocities et cetera centimeters per second below the area is being access.  Impression and Plan:  Visualization with poorly functioning left arm basilic vein transposition. Have discussed this at length the patient. Recommend a shuntogram and possible angioplasty of his venous stenotic segment. This is been arranged as an outpatient with Dr. Trula Slade on 12/11/2012    Alpha Mysliwiec [facility] [place] Office: [phone]

## 2012-12-11 NOTE — Op Note (Signed)
Vascular and Vein Specialists of [facility]  Patient name: Raymond [name] MRN: [id number] DOB: Nov 11, 1955 Sex: male  12/11/2012 Pre-operative Diagnosis: End-stage renal disease Post-operative diagnosis:  Same Surgeon:  Eldridge Abrahams Procedure Performed:  1.  ultrasound-guided access, left forearm fistula  2.  fistulogram  3.  angioplasty, left basilic vein    Indications:  The patient had a forearm splint vein transposition at [facility]. He is having difficulty with dialysis flow rates. Ultrasound identified a stenosis within the vein. He comes in for further evaluation and possible treatment.  Procedure:  The patient was identified in the holding area and taken to room 8.  The patient was then placed supine on the table and prepped and draped in the usual sterile fashion.  A time out was called.  Ultrasound was used to evaluate the fistula.  The vein was patent and compressible.  A digital ultrasound image was acquired.  The fistula was then accessed under ultrasound guidance using a micropuncture needle.  An 018 wire was then asvanced without resistance and a micropuncture sheath was placed.  Contrast injections were then performed through the sheath.  Findings:  No central venous stenosis is identified. The basilic vein in the upper arm is widely patent. The arterial venous anastomosis is widely patent. 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 There appears to be diffuse narrowing of the vein between the anastomosis and the first pseudoaneurysm.   Intervention:  A 5 French sheath was then placed. The patient was given 3000 units of heparin. An 014 wire was advanced into the radial artery. I then perform balloon angioplasty of the diseased portion of the basilic vein using a 4 x 80 balloon. This was held up for 1 minute. Completion arteriogram revealed resolution of the stenosis and narrowing within the basilic vein. There was an much improved thrill within the fistula. I elected not  to up size the balloon for fear of vessel rupture. Catheters and wires were removed. A stitch was used to close the access site  Impression:  #1  successful venoplasty of diffuse stenosis within the basilic vein just proximal to the arteriovenous anastomosis using a 4 mm balloon  #2  no evidence of central venous stenosis   V. Annamarie Major, M.D. [facility] [place] Office: [phone] Pager:  [phone]

## 2012-12-20 DIAGNOSIS — Z0279 Encounter for issue of other medical certificate: Secondary | ICD-10-CM

## 2012-12-20 DIAGNOSIS — T82898A Other specified complication of vascular prosthetic devices, implants and grafts, initial encounter: Secondary | ICD-10-CM

## 2013-03-09 ENCOUNTER — Encounter (HOSPITAL_COMMUNITY): Payer: Self-pay

## 2013-03-09 ENCOUNTER — Emergency Department (HOSPITAL_COMMUNITY)
Admission: EM | Admit: 2013-03-09 | Discharge: 2013-03-09 | Disposition: A | Discharge status: Home / Self Care | Payer: Medicare Other | Attending: Emergency Medicine | Admitting: Emergency Medicine

## 2013-03-09 ENCOUNTER — Emergency Department (HOSPITAL_COMMUNITY): Payer: Medicare Other

## 2013-03-09 DIAGNOSIS — Z8619 Personal history of other infectious and parasitic diseases: Secondary | ICD-10-CM | POA: Insufficient documentation

## 2013-03-09 DIAGNOSIS — Z862 Personal history of diseases of the blood and blood-forming organs and certain disorders involving the immune mechanism: Secondary | ICD-10-CM | POA: Insufficient documentation

## 2013-03-09 DIAGNOSIS — Z8639 Personal history of other endocrine, nutritional and metabolic disease: Secondary | ICD-10-CM | POA: Insufficient documentation

## 2013-03-09 DIAGNOSIS — F1010 Alcohol abuse, uncomplicated: Secondary | ICD-10-CM | POA: Insufficient documentation

## 2013-03-09 DIAGNOSIS — Z87891 Personal history of nicotine dependence: Secondary | ICD-10-CM | POA: Insufficient documentation

## 2013-03-09 DIAGNOSIS — N186 End stage renal disease: Secondary | ICD-10-CM | POA: Insufficient documentation

## 2013-03-09 DIAGNOSIS — N189 Chronic kidney disease, unspecified: Secondary | ICD-10-CM | POA: Insufficient documentation

## 2013-03-09 DIAGNOSIS — I129 Hypertensive chronic kidney disease with stage 1 through stage 4 chronic kidney disease, or unspecified chronic kidney disease: Secondary | ICD-10-CM | POA: Insufficient documentation

## 2013-03-09 DIAGNOSIS — Z79899 Other long term (current) drug therapy: Secondary | ICD-10-CM | POA: Insufficient documentation

## 2013-03-09 DIAGNOSIS — F10929 Alcohol use, unspecified with intoxication, unspecified: Secondary | ICD-10-CM

## 2013-03-09 LAB — POCT I-STAT, CHEM 8
Glucose, Bld: 81 mg/dL (ref 70–99)
HCT: 33 % — ABNORMAL LOW (ref 39.0–52.0)
Hemoglobin: 11.2 g/dL — ABNORMAL LOW (ref 13.0–17.0)
Potassium: 4.6 mEq/L (ref 3.5–5.1)
Sodium: 131 mEq/L — ABNORMAL LOW (ref 135–145)
TCO2: 21 mmol/L (ref 0–100)

## 2013-03-09 LAB — CBC WITH DIFFERENTIAL/PLATELET
Basophils Relative: 0 % (ref 0–1)
Eosinophils Absolute: 0 10*3/uL (ref 0.0–0.7)
MCH: 33.7 pg (ref 26.0–34.0)
MCHC: 33.4 g/dL (ref 30.0–36.0)
Neutrophils Relative %: 29 % — ABNORMAL LOW (ref 43–77)
Platelets: 124 10*3/uL — ABNORMAL LOW (ref 150–400)

## 2013-03-09 LAB — BASIC METABOLIC PANEL
BUN: 40 mg/dL — ABNORMAL HIGH (ref 6–23)
Calcium: 8.4 mg/dL (ref 8.4–10.5)
Creatinine, Ser: 10.12 mg/dL — ABNORMAL HIGH (ref 0.50–1.35)
GFR calc non Af Amer: 5 mL/min — ABNORMAL LOW (ref >90)
Glucose, Bld: 80 mg/dL (ref 70–99)

## 2013-03-09 LAB — ETHANOL: Alcohol, Ethyl (B): 259 mg/dL — ABNORMAL HIGH (ref 0–11)

## 2013-03-09 LAB — POCT I-STAT TROPONIN I

## 2013-03-09 IMAGING — CR DG CHEST 1V PORT
1 series · 1 of 1 positions shown · non-contrast
Comparison: [DATE].

CLINICAL DATA: Shortness of breath.

PORTABLE CHEST - 1 VIEW

[AP]
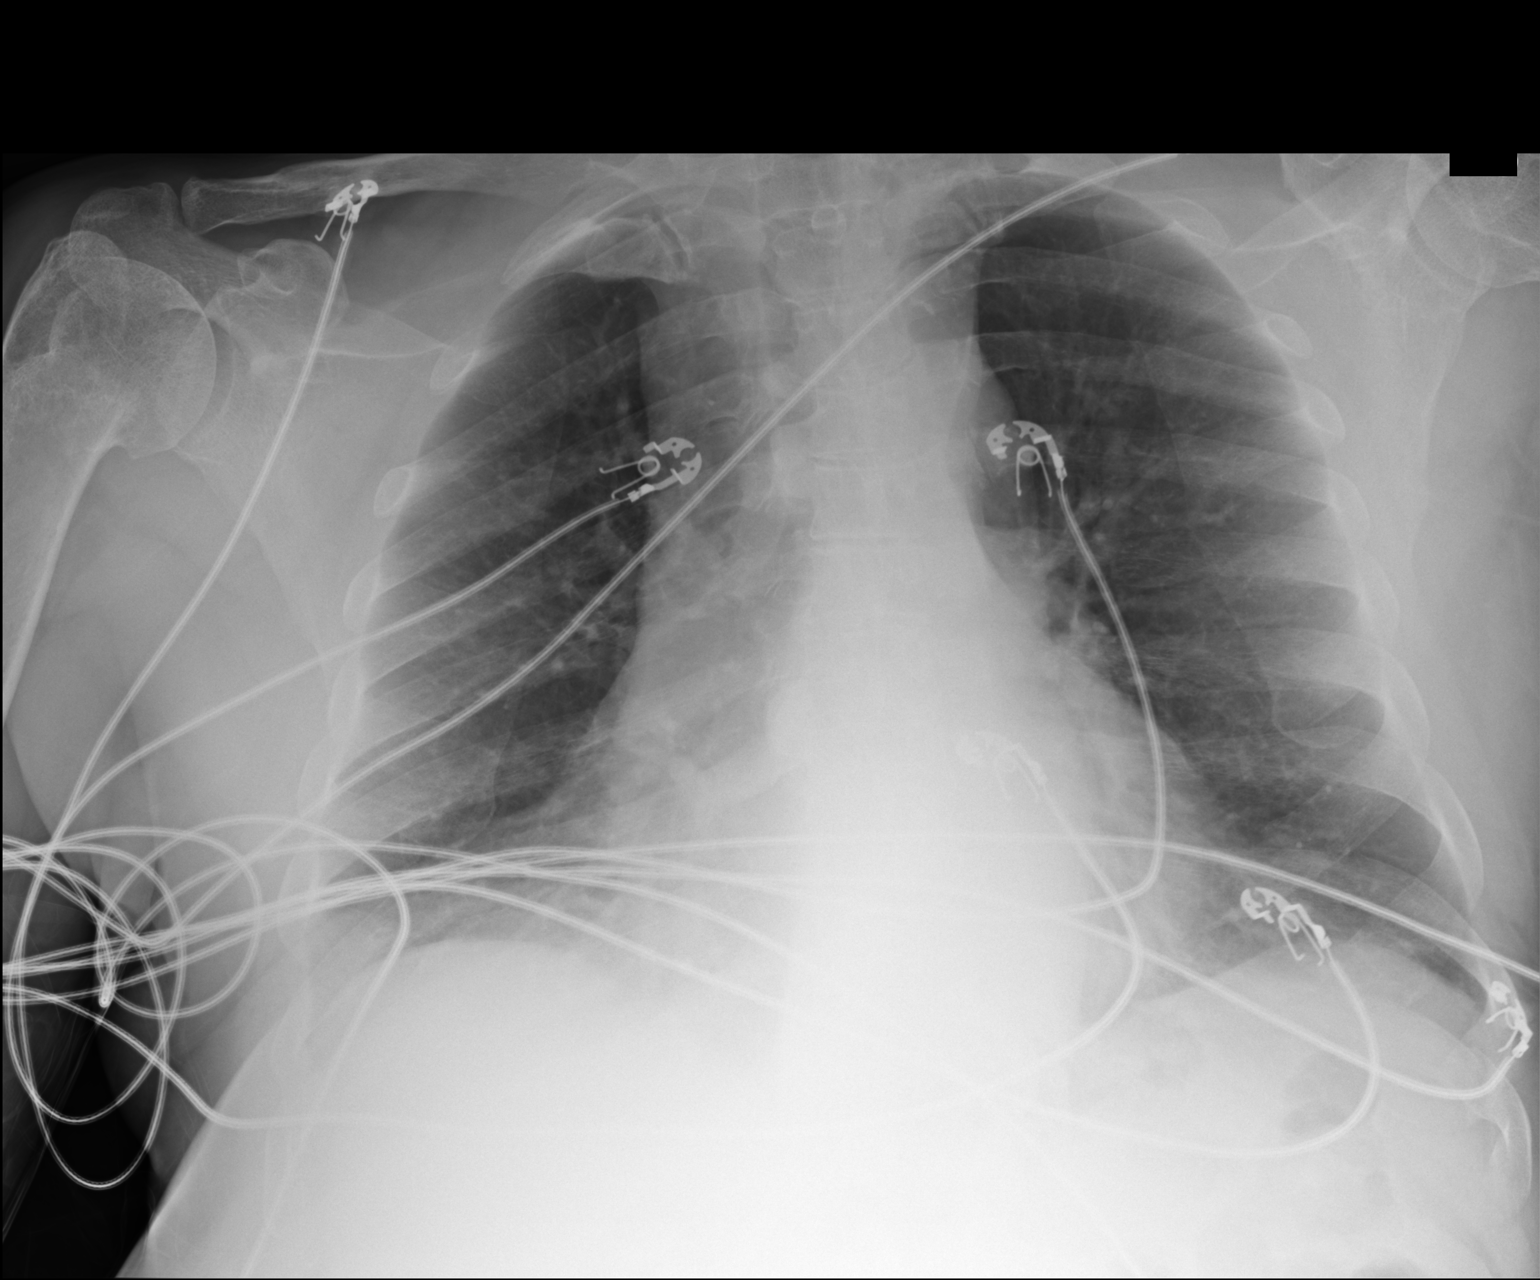

[1 of 1 positions shown; findings below may reference images not displayed]

FINDINGS: Mild cardiomegaly that is chronic.  Upper mediastinal
contours distorted by rightward rotation.

No focal infiltrate, edema, effusion, or pneumothorax.

Post traumatic deformity of the proximal right humerus related to
previous neck fracture.
IMPRESSION: No evidence of acute cardiopulmonary disease.

## 2013-03-09 MED ORDER — CLONIDINE HCL 0.2 MG PO TABS
0.2000 mg | ORAL_TABLET | Freq: Once | ORAL | Status: AC
  Administered 2013-03-09: 0.2 mg via ORAL
  Filled 2013-03-09: qty 1

## 2013-03-09 NOTE — ED Notes (Signed)
This nurse went into patient room to ambulate patient. Pt. Would wake up and talk to this nurse but would not get out of bed and began ignoring this nurse when request to get out of bed was made. Dr. Cheri Guppy informed.

## 2013-03-09 NOTE — ED Provider Notes (Signed)
CSN: [id number]     Arrival date & time 03/09/13  0257 History   First MD Initiated Contact with Patient 03/09/13 [phone]     Chief Complaint  Patient presents with  . Shortness of Breath   (Consider location/radiation/quality/duration/timing/severity/associated sxs/prior Treatment) HPI Mr. Raymond [name] is a 57 year old man with chronic kidney disease secondary to uncontrolled hypertension. He called EMS today because he felt dizzy while ambulating. His symptoms occurred after he ingested a large amount of alcohol. When paramedics arrived, the patient also complained of feeling short of breath and noted that he skipped his dialysis appointment the same morning. The patient said he did not feel like going to dialysis.  The patient denies wheezing, cough, chest pain. He says he has been compliant with his medications only intermittently.  He seemed very angry on arrival to the emergency department and was obviously intoxicated.   Past Medical History  Diagnosis Date  . Chronic kidney disease   . Hypertension   . Anemia   . Thyroid disease   . Hepatitis C     Still positive s/p liver biopsy at [facility]  and interferon therapy for 6 months. Most recent lab work was on 10/24/12  . Substance abuse    Past Surgical History  Procedure Laterality Date  . Liver biopsy    . Av fistula placement Left Aug. 2013 ?   Family History  Problem Relation Age of Onset  . Diabetes Father   . Hypertension Father    History  Substance Use Topics  . Smoking status: Former Smoker -- 25 years    Quit date: 08/01/2011  . Smokeless tobacco: Never Used  . Alcohol Use: Yes     Comment: has not used since 07/2011    Review of Systems Very difficult to obtain review of systems other than those symptoms noted above secondary to intoxication. Level 5 applies.   Allergies  Review of patient's allergies indicates no known allergies.  Home Medications   Current Outpatient Rx  Name  Route  Sig  Dispense  Refill  .  acetaminophen (TYLENOL) 325 MG tablet   Oral   Take 650 mg by mouth daily as needed for pain.         . cloNIDine (CATAPRES) 0.1 MG tablet   Oral   Take 0.1 mg by mouth at bedtime.          . metoprolol succinate (TOPROL-XL) 25 MG 24 hr tablet   Oral   Take 25 mg by mouth daily.         . sevelamer (RENAGEL) 800 MG tablet   Oral   Take 800 mg by mouth 3 (three) times daily with meals.         . thiamine (VITAMIN B-1) 100 MG tablet   Oral   Take 100 mg by mouth daily.         . vitamin B-12 (CYANOCOBALAMIN) 1000 MCG tablet   Oral   Take 1,000 mcg by mouth daily.          BP 152/112  Pulse 86  Temp(Src) 97.9 F (36.6 C) (Oral)  Resp 20  SpO2 99% Physical Exam Gen: well developed and well nourished appearing, appears intoxicated, belligerent, intermittently cooperative. Head: NCAT Eyes: PERL, EOMI, conjunctiva are injected bilaterally Nose: no epistaixis or rhinorrhea Mouth/throat: mucosa is moist and pink Neck: supple, no stridor, no JVD Lungs: CTA B, no wheezing, rhonchi or rales number respiratory rate 20 per minute CV: Regular rate and rhythm, no murmur  Abd: soft, notender, nondistended Back: no ttp, no cva ttp Extremities: Dialysis graft in the right arm with good thrill and no signs of infection Skin: no rashese, wnl Neuro: CN ii-xii grossly intact, no focal deficits aside from slurred speech likely secondary to alcohol intoxication. Psyche; agitated affect  ED Course  Procedures (including critical care time)   Results for orders placed during the hospital encounter of 03/09/13 (from the past 48 hour(s))  BASIC METABOLIC PANEL     Status: Abnormal   Collection Time    03/09/13  3:14 AM      Result Value Range   Sodium 130 (*) 135 - 145 mEq/L   Potassium 4.6  3.5 - 5.1 mEq/L   Chloride 91 (*) 96 - 112 mEq/L   CO2 21  19 - 32 mEq/L   Glucose, Bld 80  70 - 99 mg/dL   BUN 40 (*) 6 - 23 mg/dL   Creatinine, Ser 10.12 (*) 0.50 - 1.35 mg/dL    Calcium 8.4  8.4 - 10.5 mg/dL   GFR calc non Af Amer 5 (*) >90 mL/min   GFR calc Af Amer 6 (*) >90 mL/min   Comment: (NOTE)     The eGFR has been calculated using the CKD EPI equation.     This calculation has not been validated in all clinical situations.     eGFR's persistently <90 mL/min signify possible Chronic Kidney     Disease.  CBC WITH DIFFERENTIAL     Status: Abnormal   Collection Time    03/09/13  3:14 AM      Result Value Range   WBC 4.9  4.0 - 10.5 K/uL   RBC 3.06 (*) 4.22 - 5.81 MIL/uL   Hemoglobin 10.3 (*) 13.0 - 17.0 g/dL   HCT 30.8 (*) 39.0 - 52.0 %   MCV 100.7 (*) 78.0 - 100.0 fL   MCH 33.7  26.0 - 34.0 pg   MCHC 33.4  30.0 - 36.0 g/dL   RDW 13.8  11.5 - 15.5 %   Platelets 124 (*) 150 - 400 K/uL   Neutrophils Relative % 29 (*) 43 - 77 %   Neutro Abs 1.5 (*) 1.7 - 7.7 K/uL   Lymphocytes Relative 57 (*) 12 - 46 %   Lymphs Abs 2.8  0.7 - 4.0 K/uL   Monocytes Relative 13 (*) 3 - 12 %   Monocytes Absolute 0.6  0.1 - 1.0 K/uL   Eosinophils Relative 1  0 - 5 %   Eosinophils Absolute 0.0  0.0 - 0.7 K/uL   Basophils Relative 0  0 - 1 %   Basophils Absolute 0.0  0.0 - 0.1 K/uL  POCT I-STAT TROPONIN I     Status: None   Collection Time    03/09/13  3:31 AM      Result Value Range   Troponin i, poc 0.04  0.00 - 0.08 ng/mL   Comment 3            Comment: Due to the release kinetics of cTnI,     a negative result within the first hours     of the onset of symptoms does not rule out     myocardial infarction with certainty.     If myocardial infarction is still suspected,     repeat the test at appropriate intervals.  POCT I-STAT, CHEM 8     Status: Abnormal   Collection Time    03/09/13  3:32 AM  Result Value Range   Sodium 131 (*) 135 - 145 mEq/L   Potassium 4.6  3.5 - 5.1 mEq/L   Chloride 99  96 - 112 mEq/L   BUN 40 (*) 6 - 23 mg/dL   Creatinine, Ser 11.50 (*) 0.50 - 1.35 mg/dL   Glucose, Bld 81  70 - 99 mg/dL   Calcium, Ion 1.00 (*) 1.12 - 1.23 mmol/L    TCO2 21  0 - 100 mmol/L   Hemoglobin 11.2 (*) 13.0 - 17.0 g/dL   HCT 33.0 (*) 39.0 - 52.0 %  ETHANOL     Status: Abnormal   Collection Time    03/09/13  5:26 AM      Result Value Range   Alcohol, Ethyl (B) 259 (*) 0 - 11 mg/dL   Comment:            LOWEST DETECTABLE LIMIT FOR     SERUM ALCOHOL IS 11 mg/dL     FOR MEDICAL PURPOSES ONLY   *RADIOLOGY REPORT*  Clinical Data: Shortness of breath.  PORTABLE CHEST - 1 VIEW  Comparison: 05/09/2010.  Findings: Mild cardiomegaly that is chronic. Upper mediastinal contours distorted by rightward rotation.  No focal infiltrate, edema, effusion, or pneumothorax.  Post traumatic deformity of the proximal right humerus related to previous neck fracture.  IMPRESSION: No evidence of acute cardiopulmonary disease.   Original Report Authenticated By: Jorje Guild  EKG: sinus rhythm with 1st degree heart block, normal axis, computer reads diffuse ST elevation concerning for pericarditis. There is no change in today's EKG compared to previous.  MDM   Emergency department course and medical decision-making. The patient exhibited no signs of acute respiratory distress or volume overload in the emergency department. Rather, his main problem seemed to be alcohol intoxication. The patient was also noted to be hypertensive. However, this is improved with a dose of clonidine 0.79m g by mouth. The patient was observed are several hours in the emergency department until he was clinically sober and stable for discharge. He was discharged with instructions to go immediately to his dialysis center to receive hemodialysis.   Elyn Peers, MD 03/10/13 [phone]

## 2013-03-09 NOTE — ED Notes (Signed)
Per EMS, dialysis patient who missed it this morning (M, W, F). Pt. C/o SOB and dizziness.

## 2013-08-28 DIAGNOSIS — D509 Iron deficiency anemia, unspecified: Secondary | ICD-10-CM | POA: Insufficient documentation

## 2013-10-17 DIAGNOSIS — D696 Thrombocytopenia, unspecified: Secondary | ICD-10-CM | POA: Insufficient documentation

## 2013-10-22 ENCOUNTER — Other Ambulatory Visit: Payer: Self-pay | Admitting: Nephrology

## 2013-10-22 ENCOUNTER — Ambulatory Visit
Admission: RE | Admit: 2013-10-22 | Discharge: 2013-10-22 | Disposition: A | Discharge status: Home / Self Care | Payer: Medicare Other | Source: Ambulatory Visit | Attending: Nephrology | Admitting: Nephrology

## 2013-10-22 DIAGNOSIS — R079 Chest pain, unspecified: Secondary | ICD-10-CM

## 2013-10-22 IMAGING — CR DG CHEST 2V
2 series · 2 of 2 positions shown · non-contrast
Comparison: DG CHEST 1V PORT dated [DATE]; DG CHEST 2 VIEW dated
[DATE]

CLINICAL DATA: Left-sided chest pain for 2 weeks. Shortness of
breath. Dialysis.

EXAM:
CHEST  2 VIEW

[w chest pa]
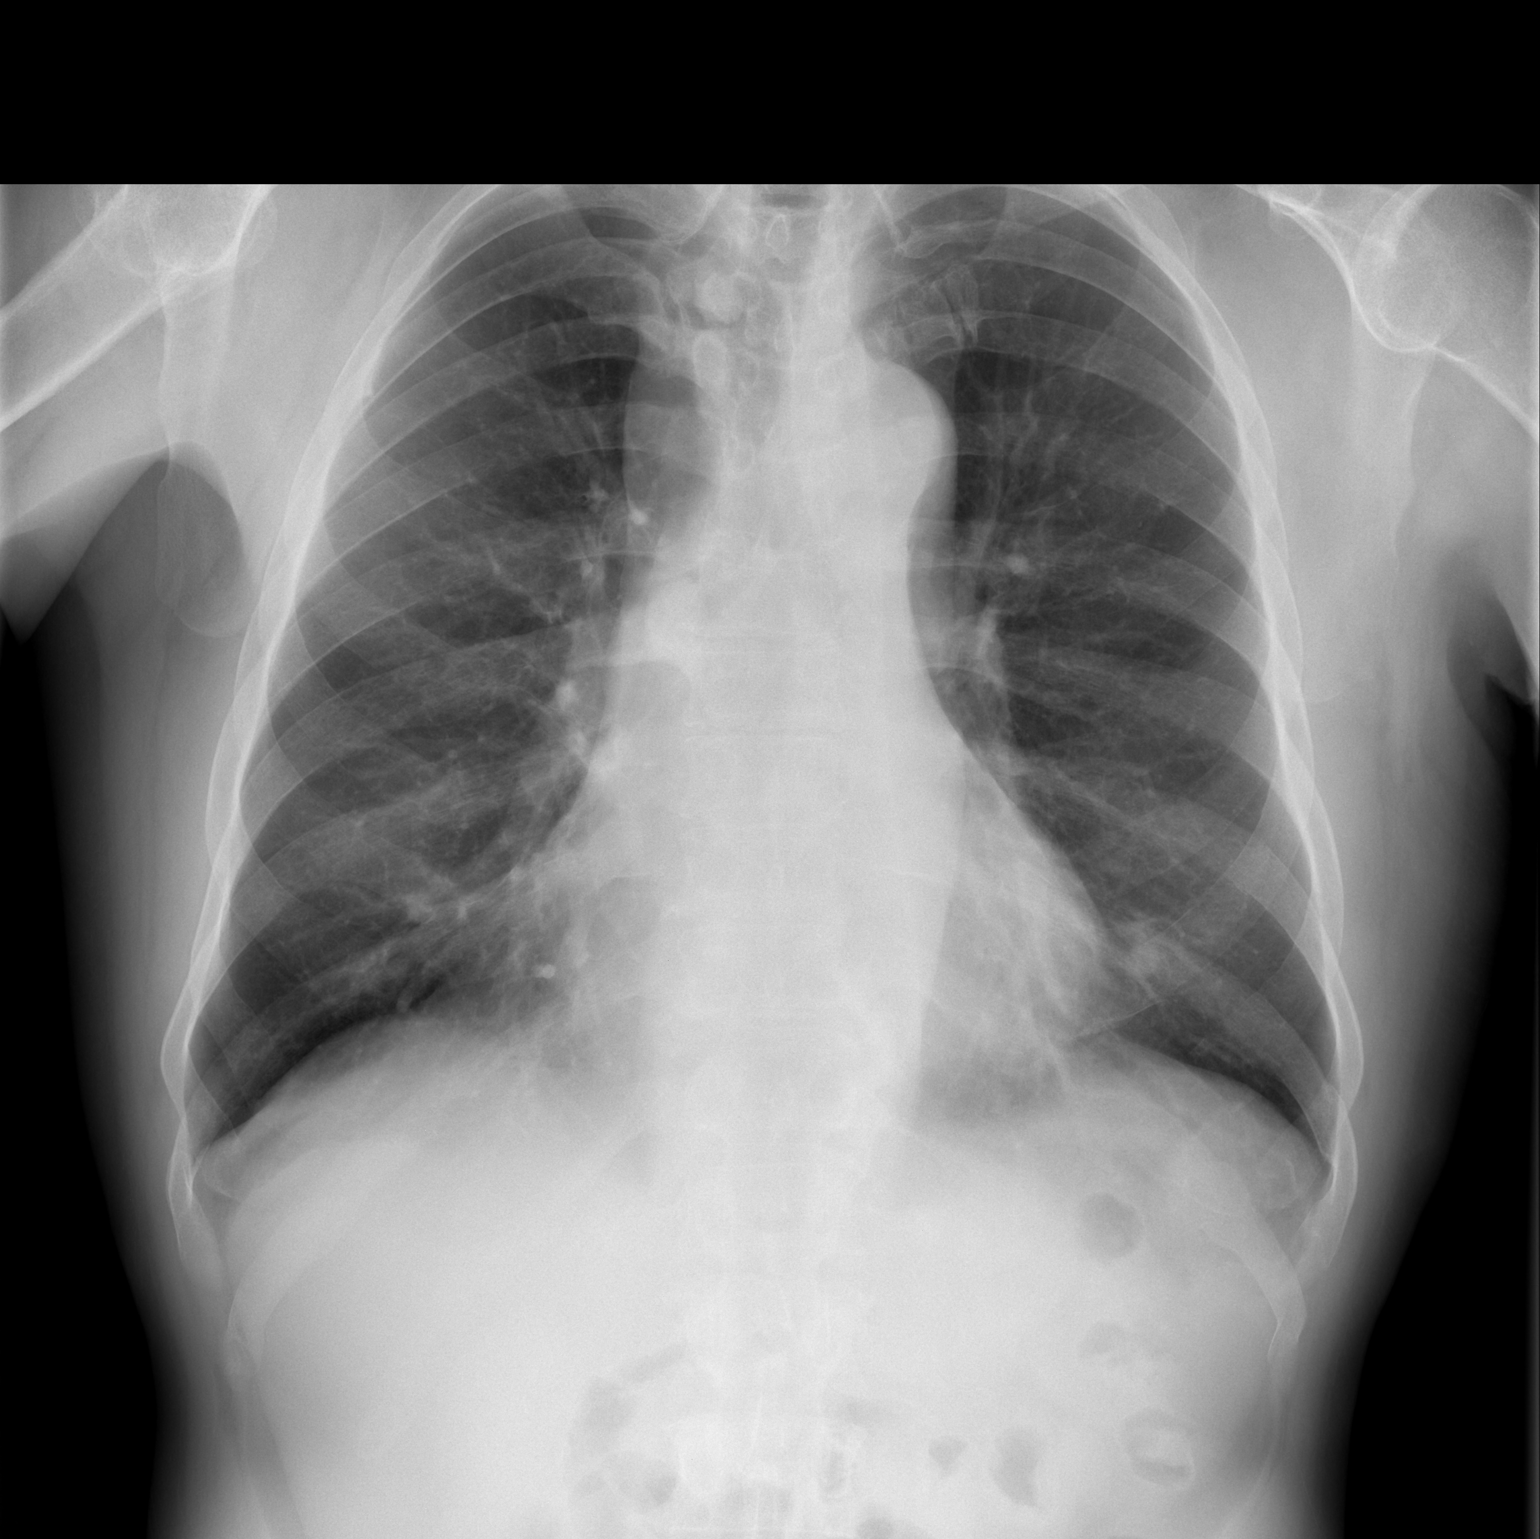

[w chest lat]
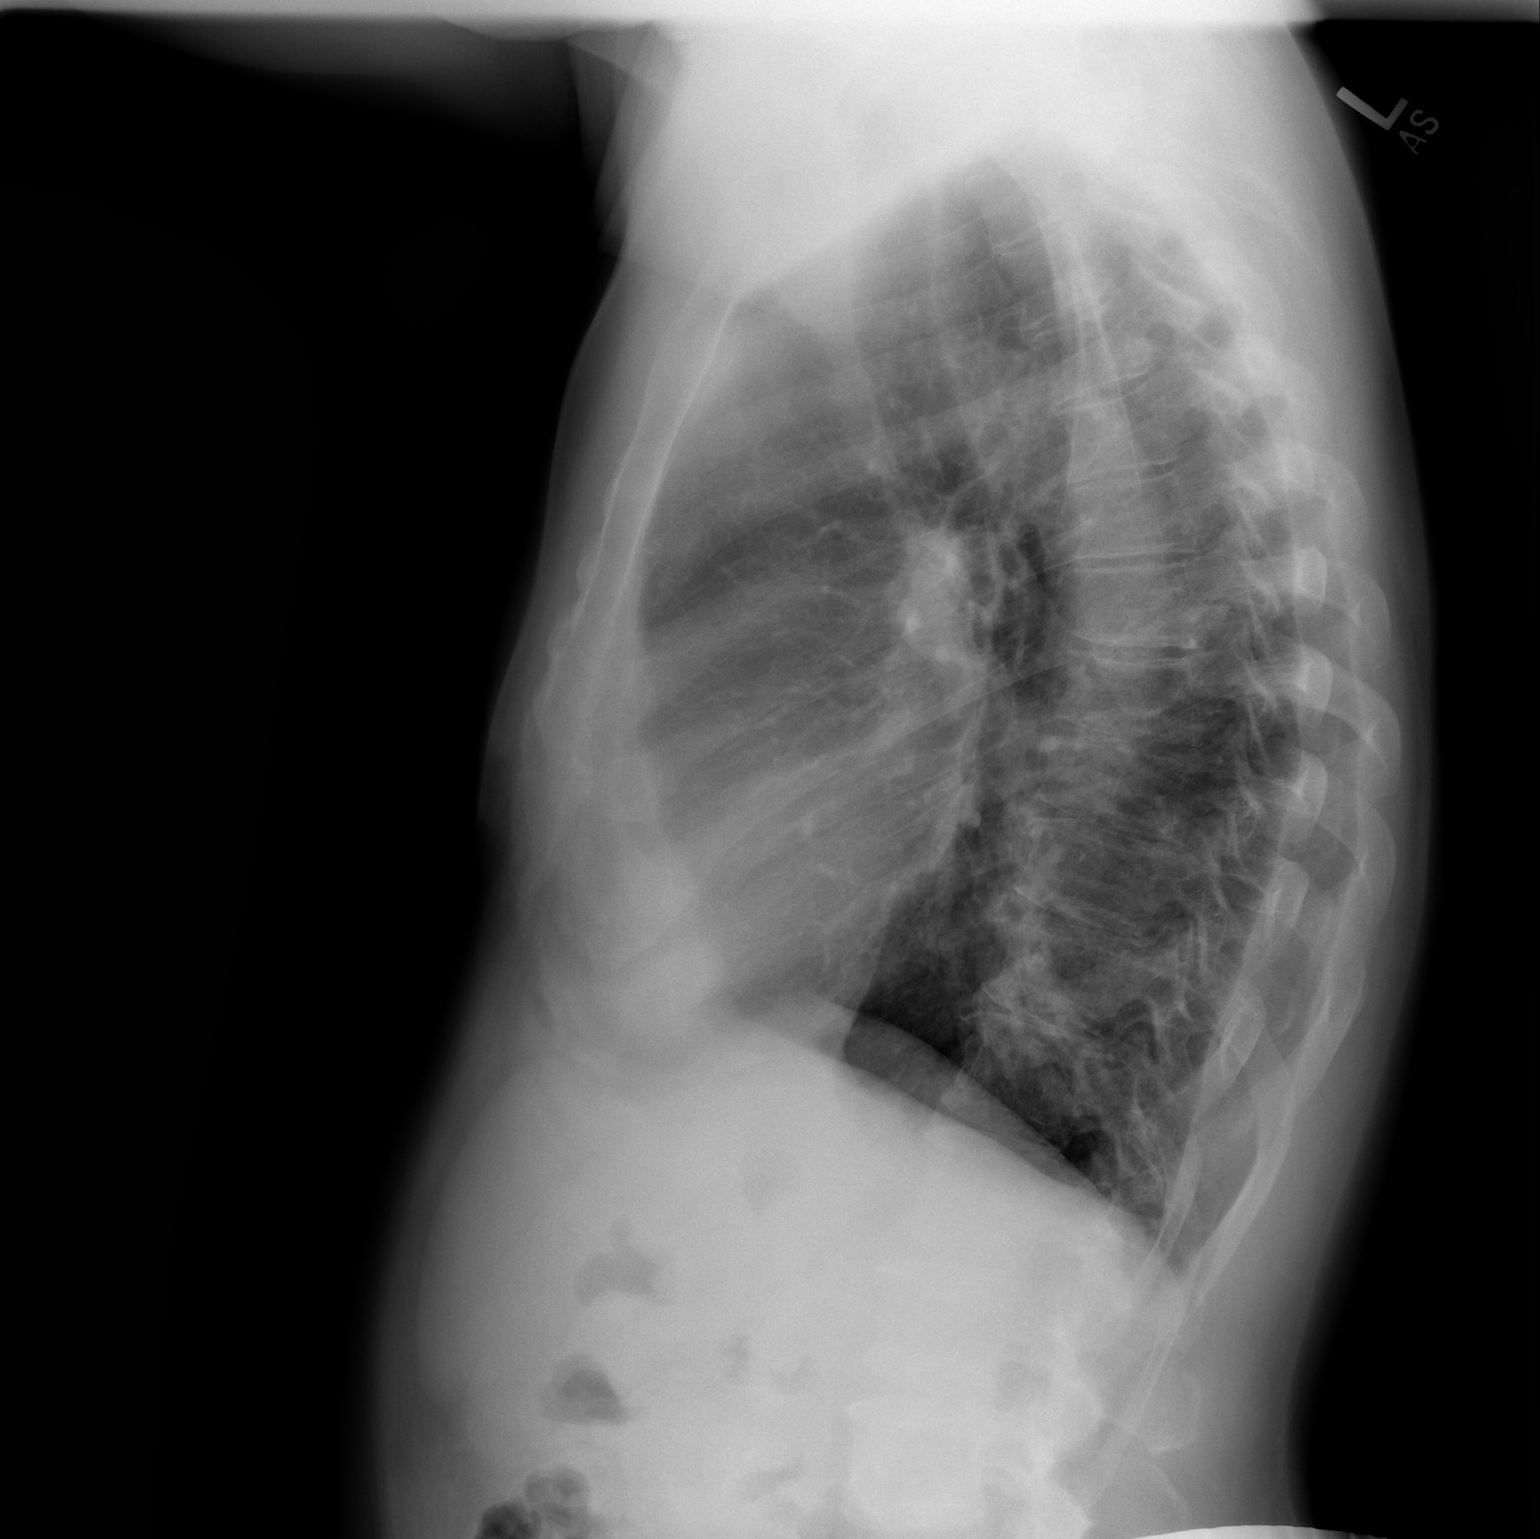

[2 of 2 positions shown; findings below may reference images not displayed]

FINDINGS: A minimal pectus excavatum deformity. Midline trachea. Normal heart
size. Mildly tortuous descending thoracic aorta. No pleural effusion
or pneumothorax. No congestive failure. Mild bibasilar volume loss,
similar. Nodular density at the left lung base could represent
osseous summation or even nipple shadow.

Tenth posterior lateral left rib nonacute fracture.
IMPRESSION: No acute cardiopulmonary disease.

Nodular density projecting over the left lung base on the frontal
radiograph. This could represent an osseous summation or nipple
shadow. Pulmonary nodule felt less likely. Consider repeat frontal
radiographs with nipple markers.

Remote left rib trauma.

## 2013-12-19 ENCOUNTER — Ambulatory Visit (INDEPENDENT_AMBULATORY_CARE_PROVIDER_SITE_OTHER): Payer: Medicare Other | Admitting: Cardiovascular Disease

## 2013-12-19 ENCOUNTER — Encounter: Payer: Self-pay | Admitting: Cardiovascular Disease

## 2013-12-19 VITALS — BP 140/88 | HR 98 | Ht 68.0 in | Wt 160.0 lb

## 2013-12-19 DIAGNOSIS — N186 End stage renal disease: Secondary | ICD-10-CM

## 2013-12-19 DIAGNOSIS — R0789 Other chest pain: Secondary | ICD-10-CM | POA: Insufficient documentation

## 2013-12-19 DIAGNOSIS — I10 Essential (primary) hypertension: Secondary | ICD-10-CM

## 2013-12-19 DIAGNOSIS — R079 Chest pain, unspecified: Secondary | ICD-10-CM

## 2013-12-19 DIAGNOSIS — E7800 Pure hypercholesterolemia, unspecified: Secondary | ICD-10-CM

## 2013-12-19 NOTE — Progress Notes (Signed)
Patient ID: Raymond [name], male   DOB: 1955/12/24, 58 y.o.   MRN: [id number]  58 yo with CRF from HTN.  Followed by Dr Dederding Fistula in LUE  Normal dialysis days M/W/F.  Las month for about 30 days had SSCP.  Intermit ant sharp across both sides of chest.  No pleuritic component  Some radiation to left shoulder.  No positional component.  Slowly resolving over last week.  Smokes 1/2 PPD  Quit last year for a while but resumed Just started on Toprol by renal.  No history of CAD but had poor medical f/u before dialysis which was started 2 years ago.  Denies ETOH or drugs.  Has never had stress testing in past  Reviewed ECG from 8/14 and and there was a suggestion of pericarditis with diffuse J point elevation     ROS: Denies fever, malais, weight loss, blurry vision, decreased visual acuity, cough, sputum, SOB, hemoptysis, pleuritic pain, palpitaitons, heartburn, abdominal pain, melena, lower extremity edema, claudication, or rash.  All other systems reviewed and negative   General: Affect appropriate Chronically ill black male HEENT: sclera injected  Neck supple with no adenopathy JVP normal no bruits no thyromegaly Lungs clear with no wheezing and good diaphragmatic motion Heart:  S1/S2 SEM  murmur,rub, gallop or click PMI normal Abdomen: benighn, BS positve, no tenderness, no AAA no bruit.  No HSM or HJR Distal pulses intact with no bruits No edema Neuro non-focal Skin warm and dry No muscular weakness Fistula LUE with nice thrill  Medications Current Outpatient Prescriptions  Medication Sig Dispense Refill  . metoprolol succinate (TOPROL-XL) 25 MG 24 hr tablet Take 25 mg by mouth daily.      . sevelamer (RENAGEL) 800 MG tablet Take 800 mg by mouth 3 (three) times daily with meals.      . thiamine (VITAMIN B-1) 100 MG tablet Take 100 mg by mouth daily.      . vitamin B-12 (CYANOCOBALAMIN) 1000 MCG tablet Take 1,000 mcg by mouth daily.       No current facility-administered  medications for this visit.    Allergies Review of patient's allergies indicates no known allergies.  Family History: Family History  Problem Relation Age of Onset  . Diabetes Father   . Hypertension Father     Social History: History   Social History  . Marital Status: Single    Spouse Name: N/A    Number of Children: N/A  . Years of Education: N/A   Occupational History  . Not on file.   Social History Main Topics  . Smoking status: Former Smoker -- 25 years    Quit date: 08/01/2011  . Smokeless tobacco: Never Used  . Alcohol Use: Yes     Comment: has not used since 07/2011  . Drug Use: Yes    Special: Cocaine, Marijuana     Comment: has not used since 07/2011  . Sexual Activity: Not on file   Other Topics Concern  . Not on file   Social History Narrative  . No narrative on file    Electrocardiogram:  03/09/13  SR early repolarization   Assessment and Plan

## 2013-12-19 NOTE — Assessment & Plan Note (Signed)
F/U Dr Dederding  Graft functioning well  No chest pain or hypotension during dialysis

## 2013-12-19 NOTE — Assessment & Plan Note (Signed)
Atypical  F/U ETT since baseline has no ST/T wave changes  Echo to assess for LVH and pericardial effusion given J point elevation on ECG CRF and risk of pericardial infamation

## 2013-12-19 NOTE — Assessment & Plan Note (Signed)
Well controlled.  Continue current medications and low sodium Dash type diet.    

## 2013-12-19 NOTE — Patient Instructions (Signed)
Your physician recommends that you schedule a follow-up appointment in: AS NEEDED  Your physician recommends that you continue on your current medications as directed. Please refer to the Current Medication list given to you today.  Your physician has requested that you have an exercise tolerance test. For further information please visit [email or web address]. Please also follow instruction sheet, as given.  Your physician has requested that you have an echocardiogram. Echocardiography is a painless test that uses sound waves to create images of your heart. It provides your doctor with information about the size and shape of your heart and how well your heart's chambers and valves are working. This procedure takes approximately one hour. There are no restrictions for this procedure.

## 2013-12-19 NOTE — Assessment & Plan Note (Signed)
Cholesterol is at goal.  Continue current dose of statin and diet Rx.  No myalgias or side effects.  F/U  LFT's in 6 months. Lab Results  Component Value Date   LDLCALC 67 10/04/2007  Labs done at dialysis with nephrology

## 2014-01-16 ENCOUNTER — Telehealth (HOSPITAL_COMMUNITY): Payer: Self-pay

## 2014-01-16 NOTE — Telephone Encounter (Signed)
Encounter complete. 

## 2014-01-21 ENCOUNTER — Ambulatory Visit (HOSPITAL_COMMUNITY): Payer: Medicare Other

## 2014-01-21 ENCOUNTER — Inpatient Hospital Stay (HOSPITAL_COMMUNITY): Admission: RE | Admit: 2014-01-21 | Payer: Medicare Other | Source: Ambulatory Visit

## 2014-03-04 ENCOUNTER — Other Ambulatory Visit: Payer: Self-pay | Admitting: Internal Medicine

## 2014-03-04 DIAGNOSIS — K7469 Other cirrhosis of liver: Secondary | ICD-10-CM

## 2014-03-11 ENCOUNTER — Ambulatory Visit
Admission: RE | Admit: 2014-03-11 | Discharge: 2014-03-11 | Disposition: A | Discharge status: Home / Self Care | Payer: Medicare Other | Source: Ambulatory Visit | Attending: Internal Medicine | Admitting: Internal Medicine

## 2014-03-11 DIAGNOSIS — K7469 Other cirrhosis of liver: Secondary | ICD-10-CM

## 2014-03-11 IMAGING — US US ABDOMEN COMPLETE
1 series · 14 of 25 positions shown · non-contrast
Comparison: CT [DATE]

CLINICAL DATA: Hepatitis-C.

EXAM:
ULTRASOUND ABDOMEN COMPLETE

[Series 1: us abdomen complete · 0.24mm/px · 14 of 101 slices shown]
[im 1/101]
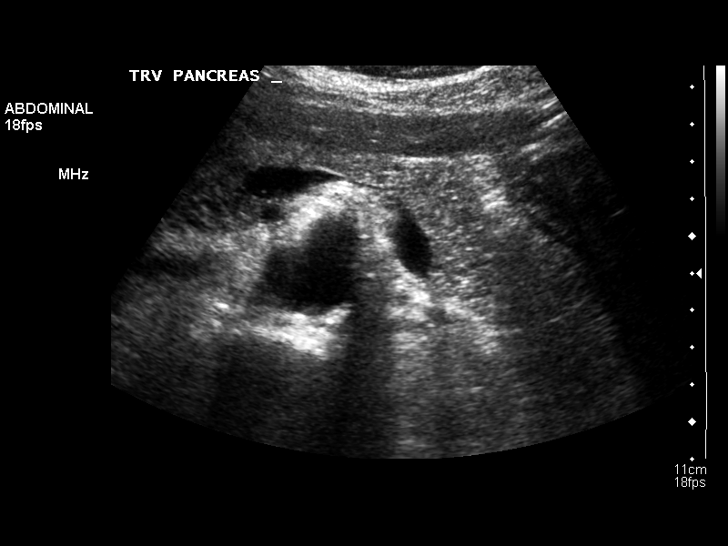
[im 9/101]
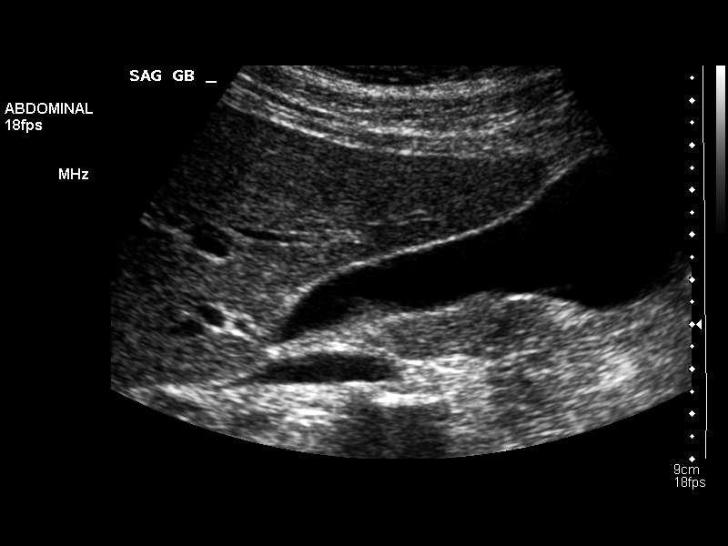
[im 17/101]
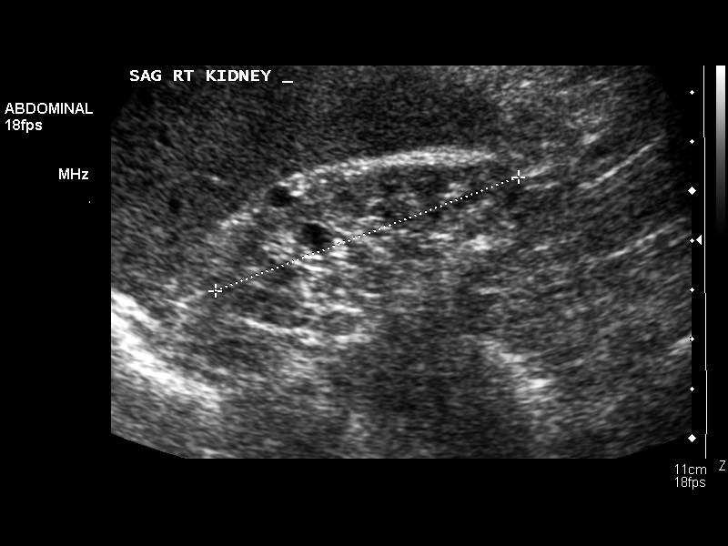
[im 26/101]
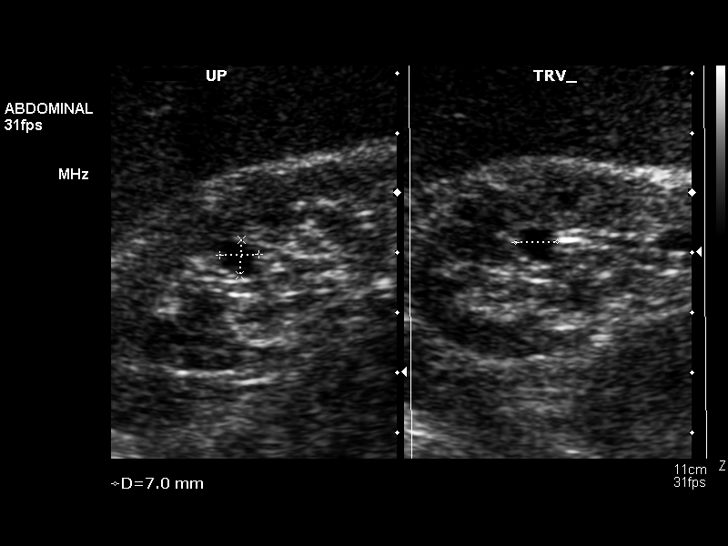
[im 34/101]
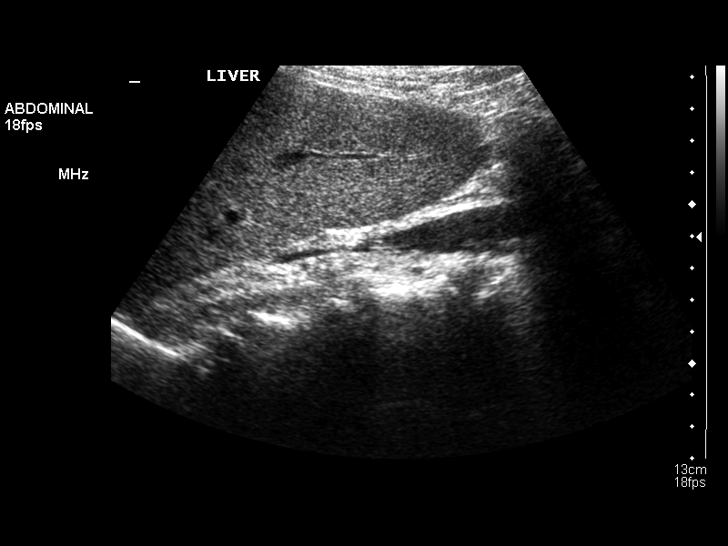
[im 38/101]
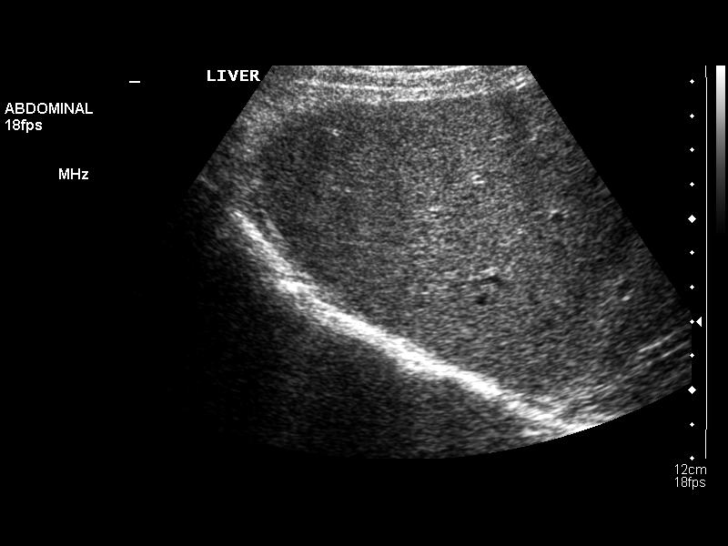
[im 46/101]
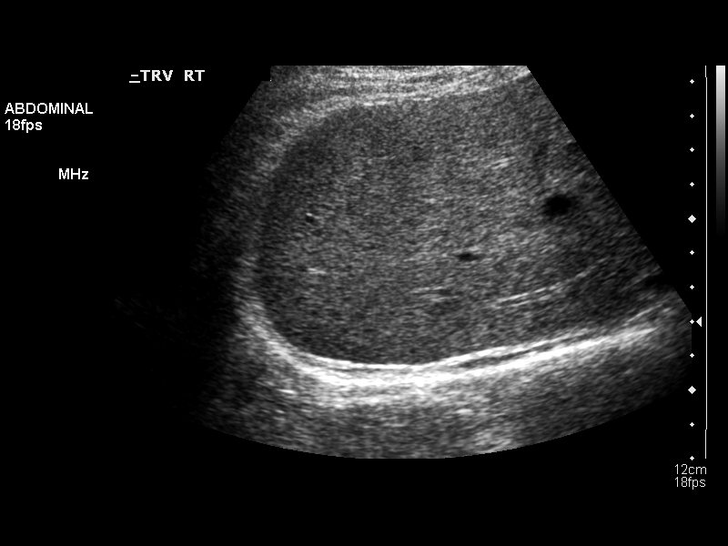
[im 55/101]
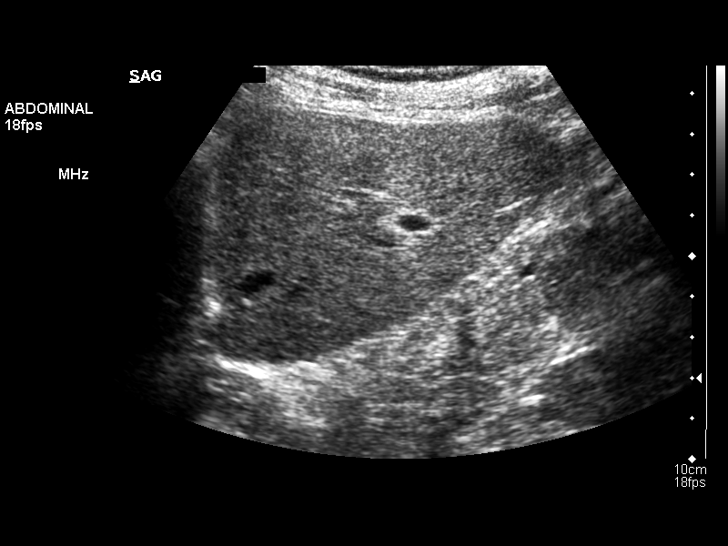
[im 63/101]
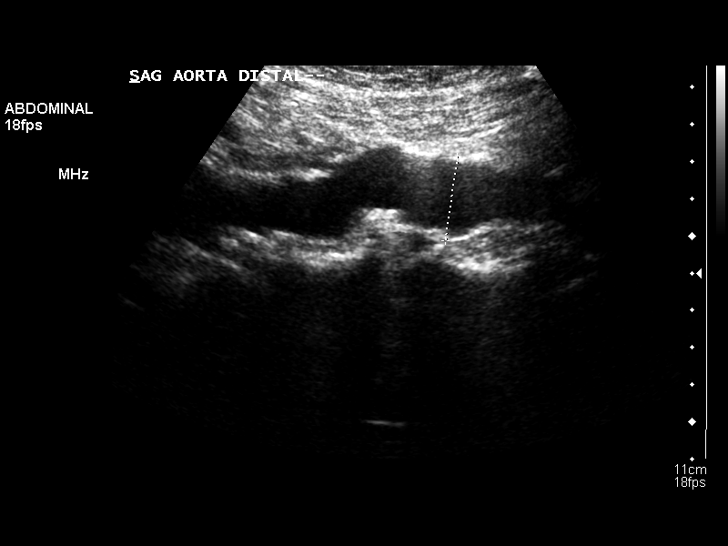
[im 67/101]
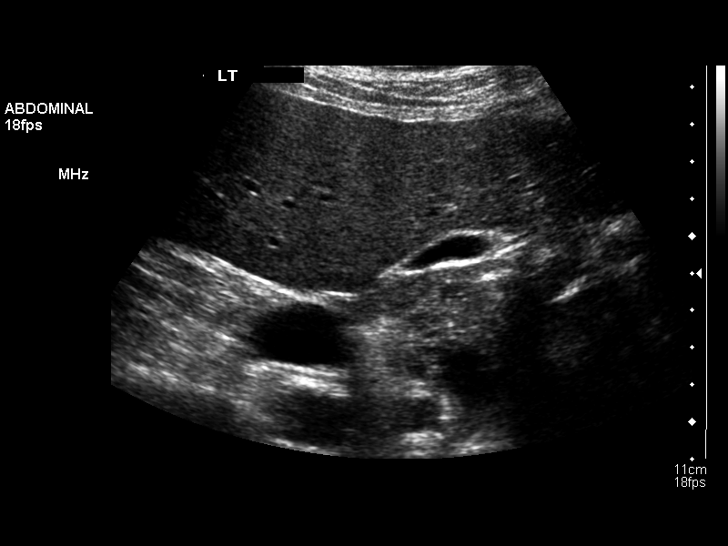
[im 76/101]
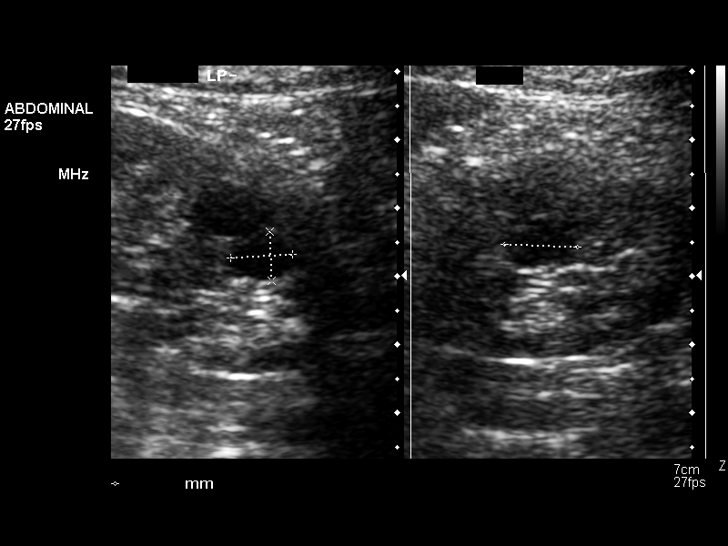
[im 84/101]
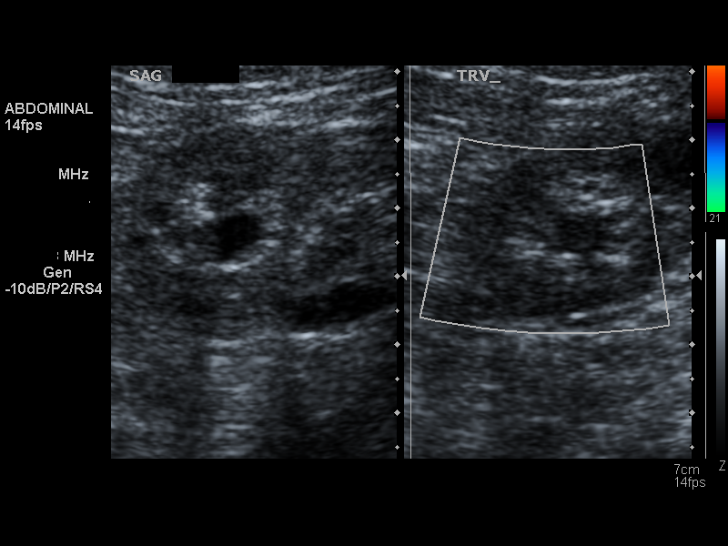
[im 92/101]
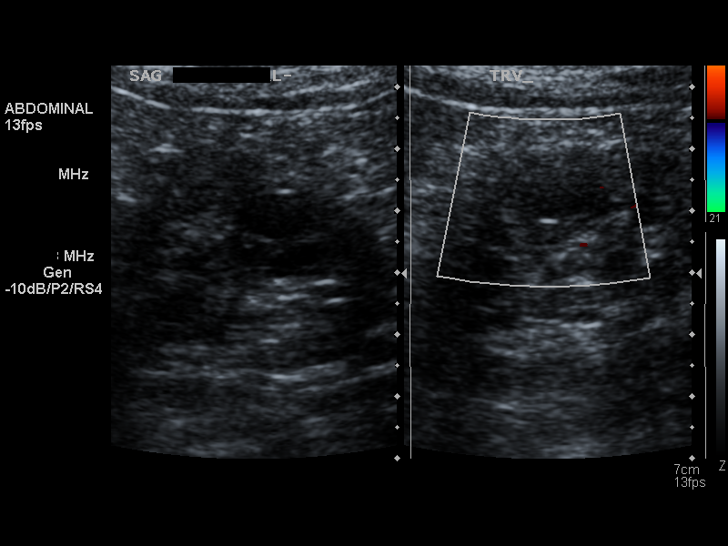
[im 101/101]
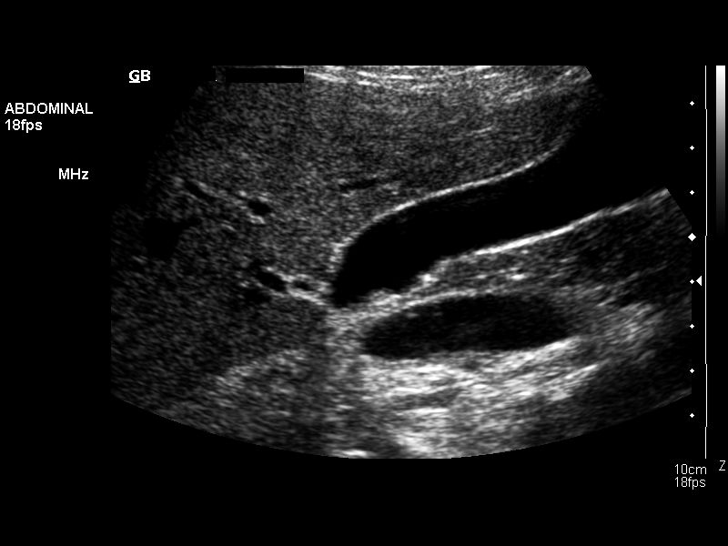

[14 of 25 positions shown; findings below may reference images not displayed]

FINDINGS: Gallbladder:

No gallstones or wall thickening visualized. No sonographic Murphy
sign noted.

Common bile duct:

Diameter: 4.6 mm

Liver:

No focal lesion identified.  Mildly heterogeneous in echogenicity.

IVC:

No abnormality visualized.

Pancreas:

Visualized portion unremarkable.

Spleen:

Size and appearance within normal limits.

Right Kidney:

Length: 6.5 cm. Mild renal cortical thinning and increased renal
cortical echogenicity. Multiple sub cm hypoechoic lesions are
identified, too small to accurately characterize.

Left Kidney:

Length: 7.4 cm. Mild renal cortical thinning and increased renal
cortical echogenicity. Multiple sub cm hypoechoic lesions are
identified. Additionally there is a 1.3 cm simple cyst.

Abdominal aorta:

No aneurysm visualized.

Other findings:

None.
IMPRESSION: No acute abnormality identified within the abdomen.

Mild heterogeneity of the hepatic parenchyma. No definite nodularity
identified.

Mild bilateral renal cortical thinning and increased echogenicity as
can be seen with chronic medical renal disease.

## 2014-06-19 ENCOUNTER — Encounter (HOSPITAL_COMMUNITY): Payer: Self-pay | Admitting: Surgery

## 2014-09-17 DIAGNOSIS — D689 Coagulation defect, unspecified: Secondary | ICD-10-CM | POA: Insufficient documentation

## 2014-10-17 DIAGNOSIS — L299 Pruritus, unspecified: Secondary | ICD-10-CM | POA: Insufficient documentation

## 2015-03-25 ENCOUNTER — Other Ambulatory Visit: Payer: Self-pay | Admitting: Nurse Practitioner

## 2015-03-25 DIAGNOSIS — C220 Liver cell carcinoma: Secondary | ICD-10-CM

## 2015-04-14 ENCOUNTER — Ambulatory Visit
Admission: RE | Admit: 2015-04-14 | Discharge: 2015-04-14 | Disposition: A | Discharge status: Home / Self Care | Payer: Medicare Other | Source: Ambulatory Visit | Attending: Nurse Practitioner | Admitting: Nurse Practitioner

## 2015-04-14 DIAGNOSIS — C220 Liver cell carcinoma: Secondary | ICD-10-CM

## 2015-04-14 IMAGING — US US ABDOMEN LIMITED
1 series · 14 of 25 positions shown · non-contrast
Comparison: None.

CLINICAL DATA: Hepatitis-C.

EXAM:
US ABDOMEN LIMITED - RIGHT UPPER QUADRANT

[Series 1: us abdomen limited · 0.22mm/px · 14 of 47 slices shown]
[im 1/47]
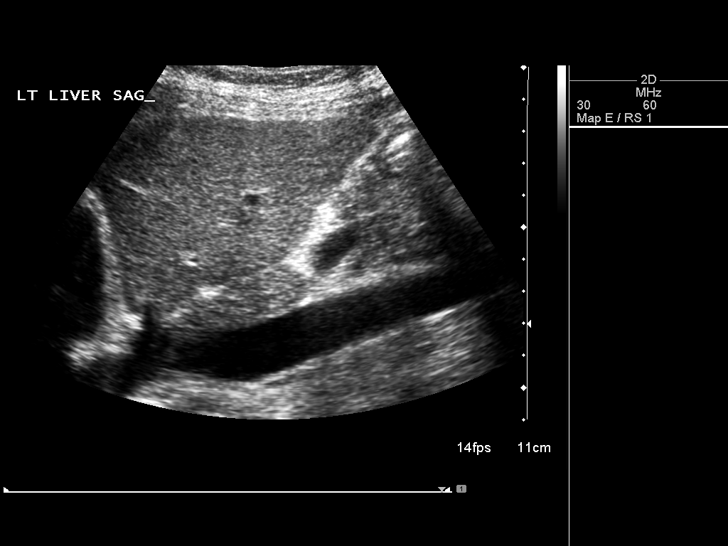
[im 4/47]
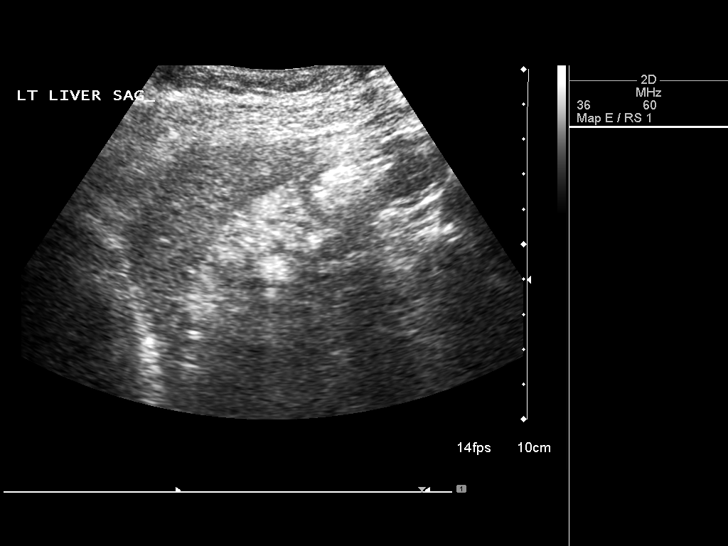
[im 8/47]
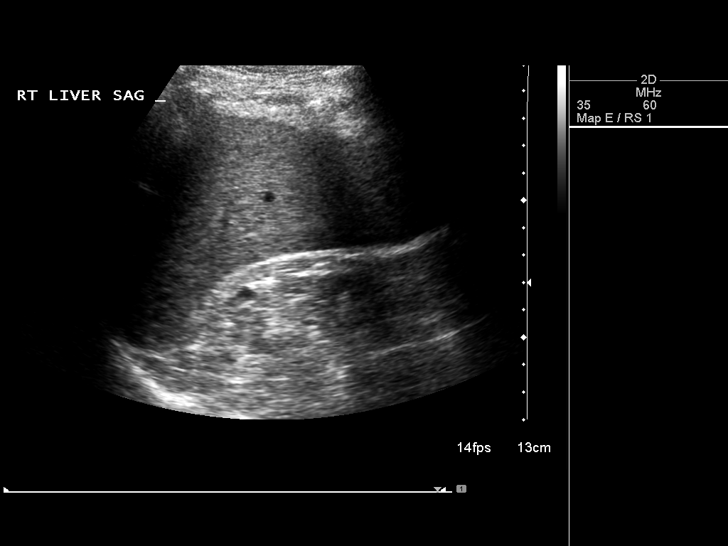
[im 12/47]
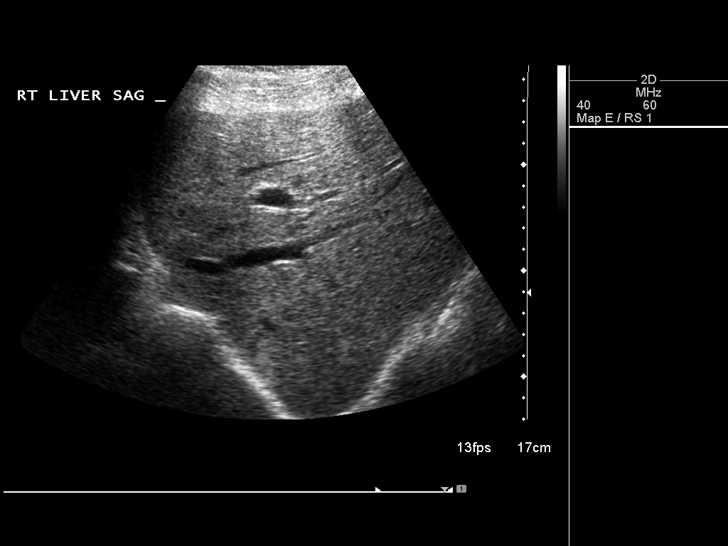
[im 16/47]
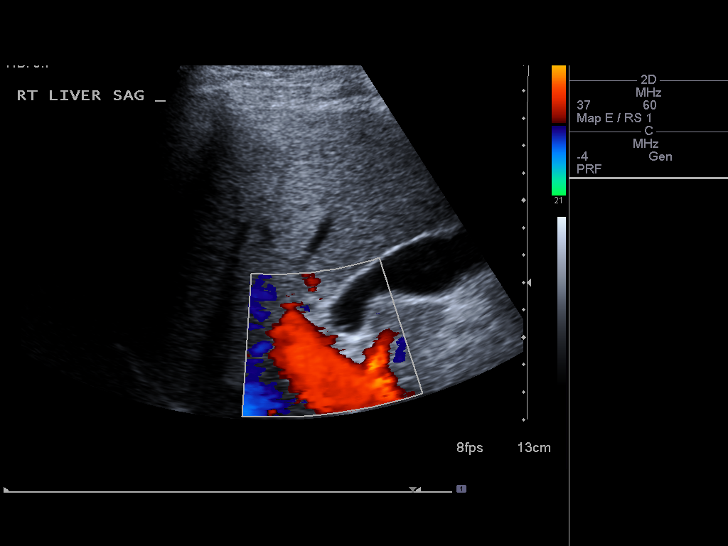
[im 18/47]
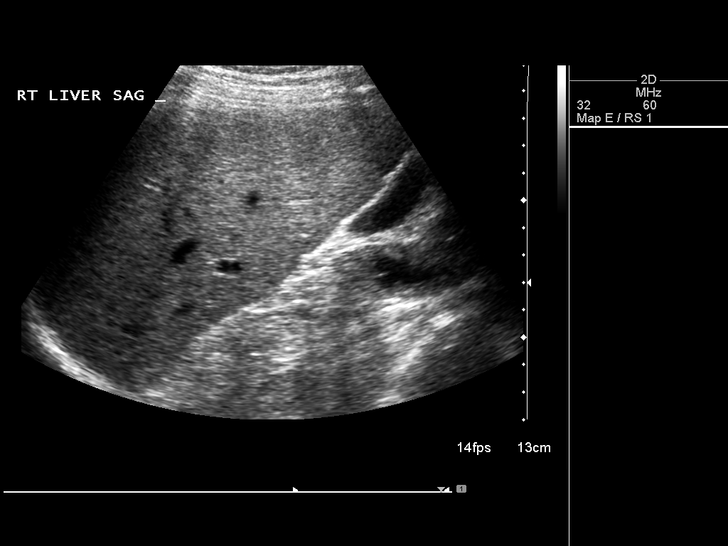
[im 22/47]
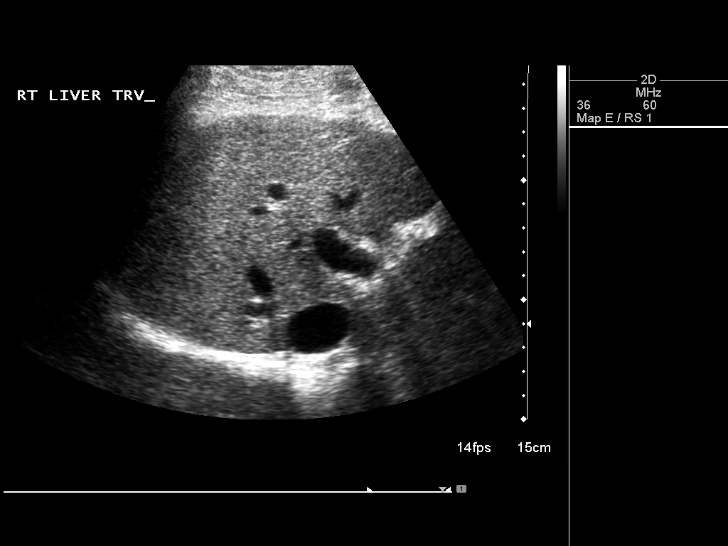
[im 25/47]
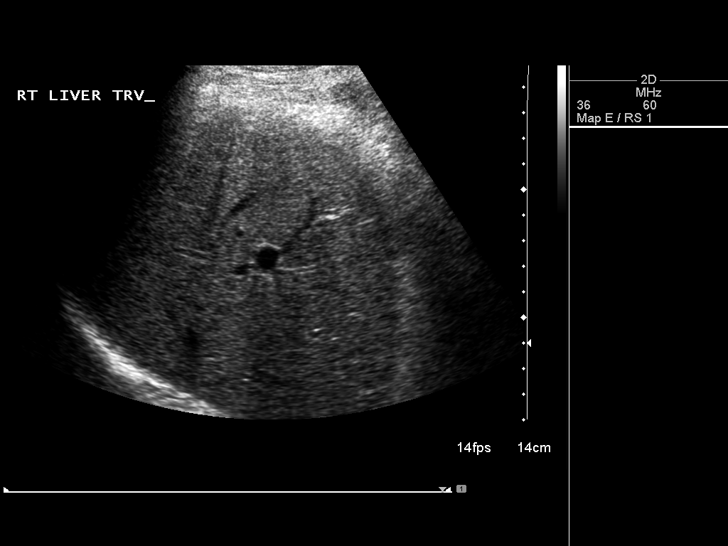
[im 29/47]
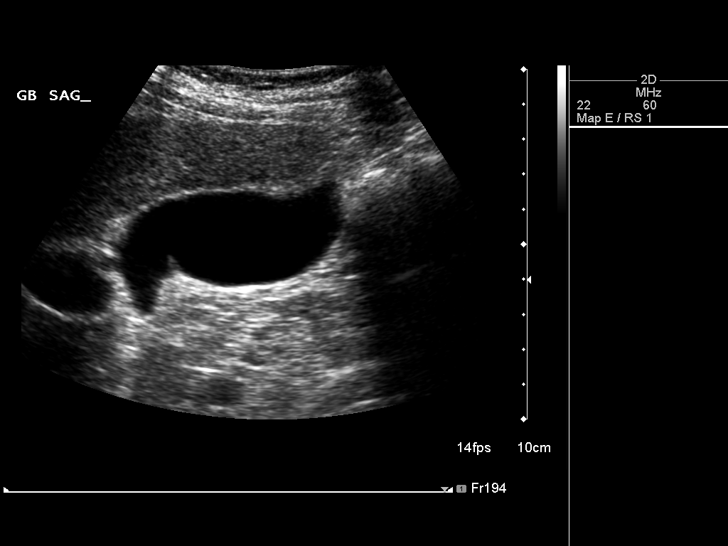
[im 31/47]
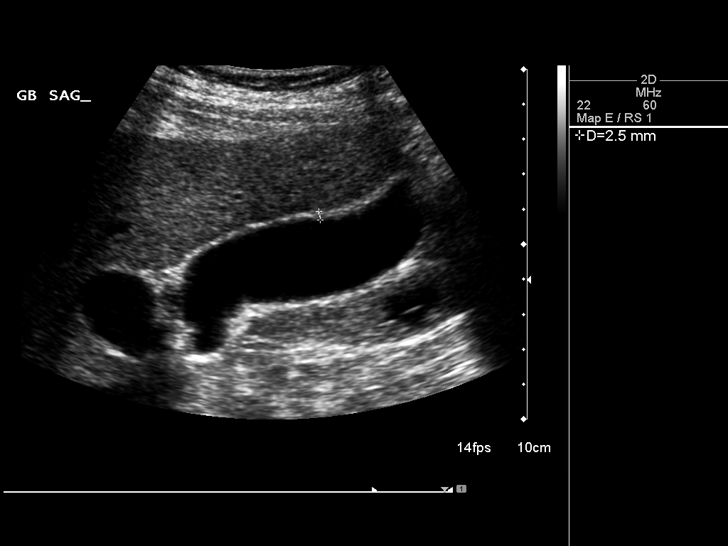
[im 35/47]
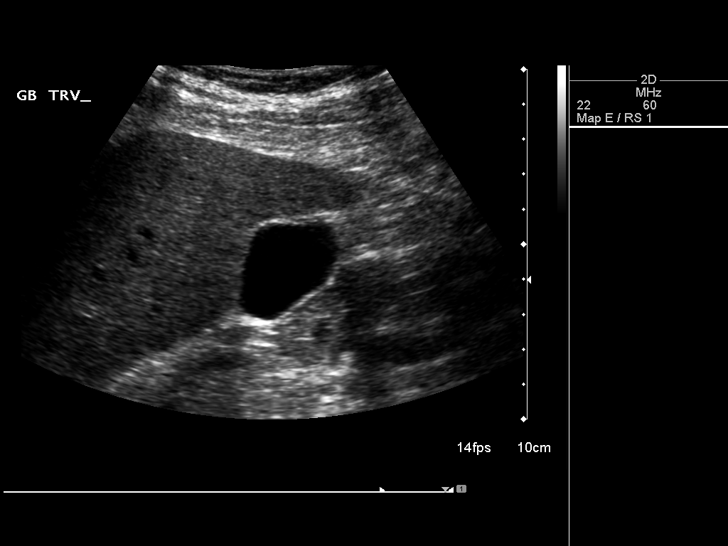
[im 39/47]
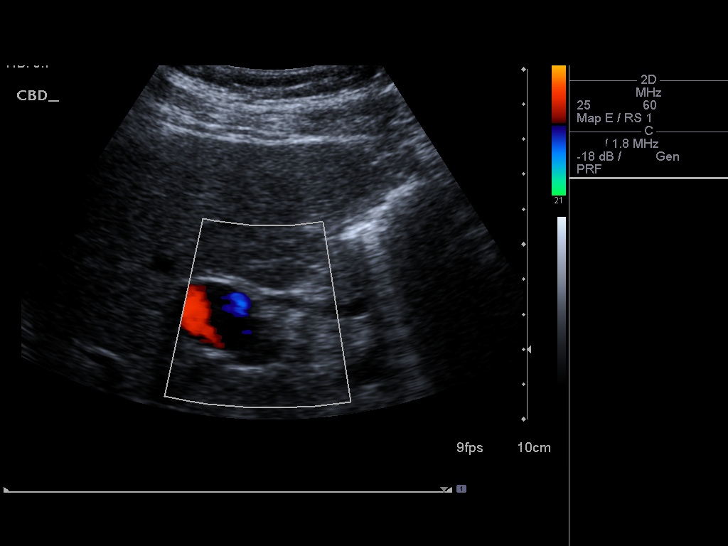
[im 43/47]
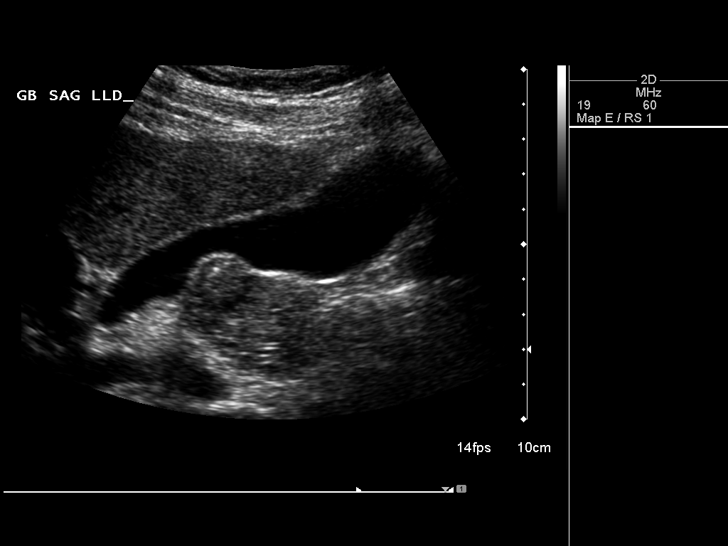
[im 47/47]
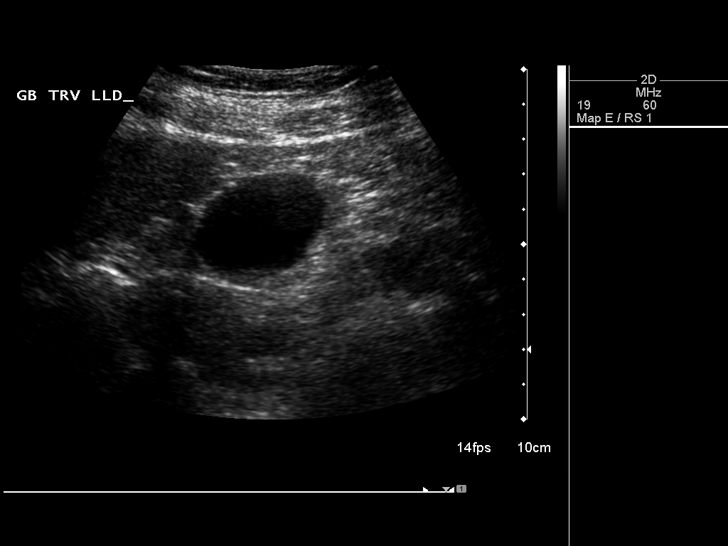

[14 of 25 positions shown; findings below may reference images not displayed]

FINDINGS: Gallbladder:

No gallstones or wall thickening visualized. No sonographic Murphy
sign noted.

Common bile duct:

Diameter: 4.6 mm

Liver:

Liver is slightly echogenic suggesting fatty infiltration and/or
hepatocellular disease. No focal hepatic abnormality identified.
IMPRESSION: Liver is slightly echogenic suggesting fatty infiltration and/or
hepatocellular disease. No focal hepatic abnormality identified. No
biliary abnormality identified.

## 2015-07-14 DIAGNOSIS — N186 End stage renal disease: Secondary | ICD-10-CM | POA: Diagnosis not present

## 2015-07-14 DIAGNOSIS — E875 Hyperkalemia: Secondary | ICD-10-CM | POA: Diagnosis not present

## 2015-07-14 DIAGNOSIS — N2581 Secondary hyperparathyroidism of renal origin: Secondary | ICD-10-CM | POA: Diagnosis not present

## 2015-07-14 DIAGNOSIS — D509 Iron deficiency anemia, unspecified: Secondary | ICD-10-CM | POA: Diagnosis not present

## 2015-07-14 DIAGNOSIS — E162 Hypoglycemia, unspecified: Secondary | ICD-10-CM | POA: Diagnosis not present

## 2015-07-14 DIAGNOSIS — D631 Anemia in chronic kidney disease: Secondary | ICD-10-CM | POA: Diagnosis not present

## 2015-07-16 DIAGNOSIS — D509 Iron deficiency anemia, unspecified: Secondary | ICD-10-CM | POA: Diagnosis not present

## 2015-07-16 DIAGNOSIS — E875 Hyperkalemia: Secondary | ICD-10-CM | POA: Diagnosis not present

## 2015-07-16 DIAGNOSIS — E162 Hypoglycemia, unspecified: Secondary | ICD-10-CM | POA: Diagnosis not present

## 2015-07-16 DIAGNOSIS — N2581 Secondary hyperparathyroidism of renal origin: Secondary | ICD-10-CM | POA: Diagnosis not present

## 2015-07-16 DIAGNOSIS — D631 Anemia in chronic kidney disease: Secondary | ICD-10-CM | POA: Diagnosis not present

## 2015-07-16 DIAGNOSIS — N186 End stage renal disease: Secondary | ICD-10-CM | POA: Diagnosis not present

## 2015-07-18 DIAGNOSIS — E875 Hyperkalemia: Secondary | ICD-10-CM | POA: Diagnosis not present

## 2015-07-18 DIAGNOSIS — N186 End stage renal disease: Secondary | ICD-10-CM | POA: Diagnosis not present

## 2015-07-18 DIAGNOSIS — D509 Iron deficiency anemia, unspecified: Secondary | ICD-10-CM | POA: Diagnosis not present

## 2015-07-18 DIAGNOSIS — D631 Anemia in chronic kidney disease: Secondary | ICD-10-CM | POA: Diagnosis not present

## 2015-07-18 DIAGNOSIS — N2581 Secondary hyperparathyroidism of renal origin: Secondary | ICD-10-CM | POA: Diagnosis not present

## 2015-07-18 DIAGNOSIS — E162 Hypoglycemia, unspecified: Secondary | ICD-10-CM | POA: Diagnosis not present

## 2015-07-21 DIAGNOSIS — D631 Anemia in chronic kidney disease: Secondary | ICD-10-CM | POA: Diagnosis not present

## 2015-07-21 DIAGNOSIS — E162 Hypoglycemia, unspecified: Secondary | ICD-10-CM | POA: Diagnosis not present

## 2015-07-21 DIAGNOSIS — N2581 Secondary hyperparathyroidism of renal origin: Secondary | ICD-10-CM | POA: Diagnosis not present

## 2015-07-21 DIAGNOSIS — E875 Hyperkalemia: Secondary | ICD-10-CM | POA: Diagnosis not present

## 2015-07-21 DIAGNOSIS — N186 End stage renal disease: Secondary | ICD-10-CM | POA: Diagnosis not present

## 2015-07-21 DIAGNOSIS — D509 Iron deficiency anemia, unspecified: Secondary | ICD-10-CM | POA: Diagnosis not present

## 2015-07-25 DIAGNOSIS — E162 Hypoglycemia, unspecified: Secondary | ICD-10-CM | POA: Diagnosis not present

## 2015-07-25 DIAGNOSIS — D631 Anemia in chronic kidney disease: Secondary | ICD-10-CM | POA: Diagnosis not present

## 2015-07-25 DIAGNOSIS — D509 Iron deficiency anemia, unspecified: Secondary | ICD-10-CM | POA: Diagnosis not present

## 2015-07-25 DIAGNOSIS — N186 End stage renal disease: Secondary | ICD-10-CM | POA: Diagnosis not present

## 2015-07-25 DIAGNOSIS — N2581 Secondary hyperparathyroidism of renal origin: Secondary | ICD-10-CM | POA: Diagnosis not present

## 2015-07-25 DIAGNOSIS — E875 Hyperkalemia: Secondary | ICD-10-CM | POA: Diagnosis not present

## 2015-07-28 DIAGNOSIS — N186 End stage renal disease: Secondary | ICD-10-CM | POA: Diagnosis not present

## 2015-07-28 DIAGNOSIS — E162 Hypoglycemia, unspecified: Secondary | ICD-10-CM | POA: Diagnosis not present

## 2015-07-28 DIAGNOSIS — E875 Hyperkalemia: Secondary | ICD-10-CM | POA: Diagnosis not present

## 2015-07-28 DIAGNOSIS — D631 Anemia in chronic kidney disease: Secondary | ICD-10-CM | POA: Diagnosis not present

## 2015-07-28 DIAGNOSIS — D509 Iron deficiency anemia, unspecified: Secondary | ICD-10-CM | POA: Diagnosis not present

## 2015-07-28 DIAGNOSIS — N2581 Secondary hyperparathyroidism of renal origin: Secondary | ICD-10-CM | POA: Diagnosis not present

## 2015-07-30 DIAGNOSIS — N2581 Secondary hyperparathyroidism of renal origin: Secondary | ICD-10-CM | POA: Diagnosis not present

## 2015-07-30 DIAGNOSIS — N186 End stage renal disease: Secondary | ICD-10-CM | POA: Diagnosis not present

## 2015-07-30 DIAGNOSIS — E875 Hyperkalemia: Secondary | ICD-10-CM | POA: Diagnosis not present

## 2015-07-30 DIAGNOSIS — E162 Hypoglycemia, unspecified: Secondary | ICD-10-CM | POA: Diagnosis not present

## 2015-07-30 DIAGNOSIS — D509 Iron deficiency anemia, unspecified: Secondary | ICD-10-CM | POA: Diagnosis not present

## 2015-07-30 DIAGNOSIS — D631 Anemia in chronic kidney disease: Secondary | ICD-10-CM | POA: Diagnosis not present

## 2015-08-01 DIAGNOSIS — D509 Iron deficiency anemia, unspecified: Secondary | ICD-10-CM | POA: Diagnosis not present

## 2015-08-01 DIAGNOSIS — E162 Hypoglycemia, unspecified: Secondary | ICD-10-CM | POA: Diagnosis not present

## 2015-08-01 DIAGNOSIS — N2581 Secondary hyperparathyroidism of renal origin: Secondary | ICD-10-CM | POA: Diagnosis not present

## 2015-08-01 DIAGNOSIS — E875 Hyperkalemia: Secondary | ICD-10-CM | POA: Diagnosis not present

## 2015-08-01 DIAGNOSIS — N186 End stage renal disease: Secondary | ICD-10-CM | POA: Diagnosis not present

## 2015-08-01 DIAGNOSIS — D631 Anemia in chronic kidney disease: Secondary | ICD-10-CM | POA: Diagnosis not present

## 2015-08-04 DIAGNOSIS — D631 Anemia in chronic kidney disease: Secondary | ICD-10-CM | POA: Diagnosis not present

## 2015-08-04 DIAGNOSIS — N2581 Secondary hyperparathyroidism of renal origin: Secondary | ICD-10-CM | POA: Diagnosis not present

## 2015-08-04 DIAGNOSIS — E162 Hypoglycemia, unspecified: Secondary | ICD-10-CM | POA: Diagnosis not present

## 2015-08-04 DIAGNOSIS — E875 Hyperkalemia: Secondary | ICD-10-CM | POA: Diagnosis not present

## 2015-08-04 DIAGNOSIS — N186 End stage renal disease: Secondary | ICD-10-CM | POA: Diagnosis not present

## 2015-08-04 DIAGNOSIS — D509 Iron deficiency anemia, unspecified: Secondary | ICD-10-CM | POA: Diagnosis not present

## 2015-08-06 DIAGNOSIS — E875 Hyperkalemia: Secondary | ICD-10-CM | POA: Diagnosis not present

## 2015-08-06 DIAGNOSIS — D509 Iron deficiency anemia, unspecified: Secondary | ICD-10-CM | POA: Diagnosis not present

## 2015-08-06 DIAGNOSIS — E162 Hypoglycemia, unspecified: Secondary | ICD-10-CM | POA: Diagnosis not present

## 2015-08-06 DIAGNOSIS — N186 End stage renal disease: Secondary | ICD-10-CM | POA: Diagnosis not present

## 2015-08-06 DIAGNOSIS — D631 Anemia in chronic kidney disease: Secondary | ICD-10-CM | POA: Diagnosis not present

## 2015-08-06 DIAGNOSIS — N2581 Secondary hyperparathyroidism of renal origin: Secondary | ICD-10-CM | POA: Diagnosis not present

## 2015-08-08 DIAGNOSIS — E162 Hypoglycemia, unspecified: Secondary | ICD-10-CM | POA: Diagnosis not present

## 2015-08-08 DIAGNOSIS — N2581 Secondary hyperparathyroidism of renal origin: Secondary | ICD-10-CM | POA: Diagnosis not present

## 2015-08-08 DIAGNOSIS — N186 End stage renal disease: Secondary | ICD-10-CM | POA: Diagnosis not present

## 2015-08-08 DIAGNOSIS — E875 Hyperkalemia: Secondary | ICD-10-CM | POA: Diagnosis not present

## 2015-08-08 DIAGNOSIS — D509 Iron deficiency anemia, unspecified: Secondary | ICD-10-CM | POA: Diagnosis not present

## 2015-08-08 DIAGNOSIS — D631 Anemia in chronic kidney disease: Secondary | ICD-10-CM | POA: Diagnosis not present

## 2015-08-11 DIAGNOSIS — N2889 Other specified disorders of kidney and ureter: Secondary | ICD-10-CM | POA: Diagnosis not present

## 2015-08-11 DIAGNOSIS — E875 Hyperkalemia: Secondary | ICD-10-CM | POA: Diagnosis not present

## 2015-08-11 DIAGNOSIS — D509 Iron deficiency anemia, unspecified: Secondary | ICD-10-CM | POA: Diagnosis not present

## 2015-08-11 DIAGNOSIS — N2581 Secondary hyperparathyroidism of renal origin: Secondary | ICD-10-CM | POA: Diagnosis not present

## 2015-08-11 DIAGNOSIS — E162 Hypoglycemia, unspecified: Secondary | ICD-10-CM | POA: Diagnosis not present

## 2015-08-11 DIAGNOSIS — N186 End stage renal disease: Secondary | ICD-10-CM | POA: Diagnosis not present

## 2015-08-11 DIAGNOSIS — Z992 Dependence on renal dialysis: Secondary | ICD-10-CM | POA: Diagnosis not present

## 2015-08-11 DIAGNOSIS — D631 Anemia in chronic kidney disease: Secondary | ICD-10-CM | POA: Diagnosis not present

## 2015-08-13 DIAGNOSIS — D509 Iron deficiency anemia, unspecified: Secondary | ICD-10-CM | POA: Diagnosis not present

## 2015-08-13 DIAGNOSIS — N186 End stage renal disease: Secondary | ICD-10-CM | POA: Diagnosis not present

## 2015-08-13 DIAGNOSIS — N2581 Secondary hyperparathyroidism of renal origin: Secondary | ICD-10-CM | POA: Diagnosis not present

## 2015-08-13 DIAGNOSIS — D631 Anemia in chronic kidney disease: Secondary | ICD-10-CM | POA: Diagnosis not present

## 2015-08-15 DIAGNOSIS — D631 Anemia in chronic kidney disease: Secondary | ICD-10-CM | POA: Diagnosis not present

## 2015-08-15 DIAGNOSIS — N2581 Secondary hyperparathyroidism of renal origin: Secondary | ICD-10-CM | POA: Diagnosis not present

## 2015-08-15 DIAGNOSIS — D509 Iron deficiency anemia, unspecified: Secondary | ICD-10-CM | POA: Diagnosis not present

## 2015-08-15 DIAGNOSIS — N186 End stage renal disease: Secondary | ICD-10-CM | POA: Diagnosis not present

## 2015-08-18 DIAGNOSIS — D631 Anemia in chronic kidney disease: Secondary | ICD-10-CM | POA: Diagnosis not present

## 2015-08-18 DIAGNOSIS — N2581 Secondary hyperparathyroidism of renal origin: Secondary | ICD-10-CM | POA: Diagnosis not present

## 2015-08-18 DIAGNOSIS — D509 Iron deficiency anemia, unspecified: Secondary | ICD-10-CM | POA: Diagnosis not present

## 2015-08-18 DIAGNOSIS — N186 End stage renal disease: Secondary | ICD-10-CM | POA: Diagnosis not present

## 2015-08-20 DIAGNOSIS — D631 Anemia in chronic kidney disease: Secondary | ICD-10-CM | POA: Diagnosis not present

## 2015-08-20 DIAGNOSIS — N186 End stage renal disease: Secondary | ICD-10-CM | POA: Diagnosis not present

## 2015-08-20 DIAGNOSIS — D509 Iron deficiency anemia, unspecified: Secondary | ICD-10-CM | POA: Diagnosis not present

## 2015-08-20 DIAGNOSIS — N2581 Secondary hyperparathyroidism of renal origin: Secondary | ICD-10-CM | POA: Diagnosis not present

## 2015-08-25 DIAGNOSIS — N186 End stage renal disease: Secondary | ICD-10-CM | POA: Diagnosis not present

## 2015-08-25 DIAGNOSIS — N2581 Secondary hyperparathyroidism of renal origin: Secondary | ICD-10-CM | POA: Diagnosis not present

## 2015-08-25 DIAGNOSIS — D631 Anemia in chronic kidney disease: Secondary | ICD-10-CM | POA: Diagnosis not present

## 2015-08-25 DIAGNOSIS — D509 Iron deficiency anemia, unspecified: Secondary | ICD-10-CM | POA: Diagnosis not present

## 2015-08-27 DIAGNOSIS — D509 Iron deficiency anemia, unspecified: Secondary | ICD-10-CM | POA: Diagnosis not present

## 2015-08-27 DIAGNOSIS — N2581 Secondary hyperparathyroidism of renal origin: Secondary | ICD-10-CM | POA: Diagnosis not present

## 2015-08-27 DIAGNOSIS — D631 Anemia in chronic kidney disease: Secondary | ICD-10-CM | POA: Diagnosis not present

## 2015-08-27 DIAGNOSIS — N186 End stage renal disease: Secondary | ICD-10-CM | POA: Diagnosis not present

## 2015-08-29 DIAGNOSIS — N2581 Secondary hyperparathyroidism of renal origin: Secondary | ICD-10-CM | POA: Diagnosis not present

## 2015-08-29 DIAGNOSIS — N186 End stage renal disease: Secondary | ICD-10-CM | POA: Diagnosis not present

## 2015-08-29 DIAGNOSIS — D509 Iron deficiency anemia, unspecified: Secondary | ICD-10-CM | POA: Diagnosis not present

## 2015-08-29 DIAGNOSIS — D631 Anemia in chronic kidney disease: Secondary | ICD-10-CM | POA: Diagnosis not present

## 2015-09-01 DIAGNOSIS — N186 End stage renal disease: Secondary | ICD-10-CM | POA: Diagnosis not present

## 2015-09-01 DIAGNOSIS — N2581 Secondary hyperparathyroidism of renal origin: Secondary | ICD-10-CM | POA: Diagnosis not present

## 2015-09-01 DIAGNOSIS — D509 Iron deficiency anemia, unspecified: Secondary | ICD-10-CM | POA: Diagnosis not present

## 2015-09-01 DIAGNOSIS — D631 Anemia in chronic kidney disease: Secondary | ICD-10-CM | POA: Diagnosis not present

## 2015-09-03 DIAGNOSIS — N186 End stage renal disease: Secondary | ICD-10-CM | POA: Diagnosis not present

## 2015-09-03 DIAGNOSIS — D509 Iron deficiency anemia, unspecified: Secondary | ICD-10-CM | POA: Diagnosis not present

## 2015-09-03 DIAGNOSIS — D631 Anemia in chronic kidney disease: Secondary | ICD-10-CM | POA: Diagnosis not present

## 2015-09-03 DIAGNOSIS — N2581 Secondary hyperparathyroidism of renal origin: Secondary | ICD-10-CM | POA: Diagnosis not present

## 2015-09-05 DIAGNOSIS — D509 Iron deficiency anemia, unspecified: Secondary | ICD-10-CM | POA: Diagnosis not present

## 2015-09-05 DIAGNOSIS — D631 Anemia in chronic kidney disease: Secondary | ICD-10-CM | POA: Diagnosis not present

## 2015-09-05 DIAGNOSIS — N186 End stage renal disease: Secondary | ICD-10-CM | POA: Diagnosis not present

## 2015-09-05 DIAGNOSIS — N2581 Secondary hyperparathyroidism of renal origin: Secondary | ICD-10-CM | POA: Diagnosis not present

## 2015-09-08 DIAGNOSIS — Z992 Dependence on renal dialysis: Secondary | ICD-10-CM | POA: Diagnosis not present

## 2015-09-08 DIAGNOSIS — N2889 Other specified disorders of kidney and ureter: Secondary | ICD-10-CM | POA: Diagnosis not present

## 2015-09-08 DIAGNOSIS — N186 End stage renal disease: Secondary | ICD-10-CM | POA: Diagnosis not present

## 2015-09-10 DIAGNOSIS — D509 Iron deficiency anemia, unspecified: Secondary | ICD-10-CM | POA: Diagnosis not present

## 2015-09-10 DIAGNOSIS — N2581 Secondary hyperparathyroidism of renal origin: Secondary | ICD-10-CM | POA: Diagnosis not present

## 2015-09-10 DIAGNOSIS — N186 End stage renal disease: Secondary | ICD-10-CM | POA: Diagnosis not present

## 2015-09-10 DIAGNOSIS — D631 Anemia in chronic kidney disease: Secondary | ICD-10-CM | POA: Diagnosis not present

## 2015-09-15 DIAGNOSIS — D509 Iron deficiency anemia, unspecified: Secondary | ICD-10-CM | POA: Diagnosis not present

## 2015-09-15 DIAGNOSIS — N186 End stage renal disease: Secondary | ICD-10-CM | POA: Diagnosis not present

## 2015-09-15 DIAGNOSIS — D631 Anemia in chronic kidney disease: Secondary | ICD-10-CM | POA: Diagnosis not present

## 2015-09-15 DIAGNOSIS — N2581 Secondary hyperparathyroidism of renal origin: Secondary | ICD-10-CM | POA: Diagnosis not present

## 2015-09-17 DIAGNOSIS — D509 Iron deficiency anemia, unspecified: Secondary | ICD-10-CM | POA: Diagnosis not present

## 2015-09-17 DIAGNOSIS — N186 End stage renal disease: Secondary | ICD-10-CM | POA: Diagnosis not present

## 2015-09-17 DIAGNOSIS — D631 Anemia in chronic kidney disease: Secondary | ICD-10-CM | POA: Diagnosis not present

## 2015-09-17 DIAGNOSIS — N2581 Secondary hyperparathyroidism of renal origin: Secondary | ICD-10-CM | POA: Diagnosis not present

## 2015-09-19 DIAGNOSIS — N2581 Secondary hyperparathyroidism of renal origin: Secondary | ICD-10-CM | POA: Diagnosis not present

## 2015-09-19 DIAGNOSIS — D631 Anemia in chronic kidney disease: Secondary | ICD-10-CM | POA: Diagnosis not present

## 2015-09-19 DIAGNOSIS — D509 Iron deficiency anemia, unspecified: Secondary | ICD-10-CM | POA: Diagnosis not present

## 2015-09-19 DIAGNOSIS — N186 End stage renal disease: Secondary | ICD-10-CM | POA: Diagnosis not present

## 2015-09-22 DIAGNOSIS — N186 End stage renal disease: Secondary | ICD-10-CM | POA: Diagnosis not present

## 2015-09-22 DIAGNOSIS — D631 Anemia in chronic kidney disease: Secondary | ICD-10-CM | POA: Diagnosis not present

## 2015-09-22 DIAGNOSIS — N2581 Secondary hyperparathyroidism of renal origin: Secondary | ICD-10-CM | POA: Diagnosis not present

## 2015-09-22 DIAGNOSIS — D509 Iron deficiency anemia, unspecified: Secondary | ICD-10-CM | POA: Diagnosis not present

## 2015-09-26 DIAGNOSIS — N2581 Secondary hyperparathyroidism of renal origin: Secondary | ICD-10-CM | POA: Diagnosis not present

## 2015-09-26 DIAGNOSIS — D631 Anemia in chronic kidney disease: Secondary | ICD-10-CM | POA: Diagnosis not present

## 2015-09-26 DIAGNOSIS — D509 Iron deficiency anemia, unspecified: Secondary | ICD-10-CM | POA: Diagnosis not present

## 2015-09-26 DIAGNOSIS — N186 End stage renal disease: Secondary | ICD-10-CM | POA: Diagnosis not present

## 2015-10-01 DIAGNOSIS — D509 Iron deficiency anemia, unspecified: Secondary | ICD-10-CM | POA: Diagnosis not present

## 2015-10-01 DIAGNOSIS — N2581 Secondary hyperparathyroidism of renal origin: Secondary | ICD-10-CM | POA: Diagnosis not present

## 2015-10-01 DIAGNOSIS — D631 Anemia in chronic kidney disease: Secondary | ICD-10-CM | POA: Diagnosis not present

## 2015-10-01 DIAGNOSIS — N186 End stage renal disease: Secondary | ICD-10-CM | POA: Diagnosis not present

## 2015-10-03 DIAGNOSIS — N186 End stage renal disease: Secondary | ICD-10-CM | POA: Diagnosis not present

## 2015-10-03 DIAGNOSIS — N2581 Secondary hyperparathyroidism of renal origin: Secondary | ICD-10-CM | POA: Diagnosis not present

## 2015-10-03 DIAGNOSIS — D631 Anemia in chronic kidney disease: Secondary | ICD-10-CM | POA: Diagnosis not present

## 2015-10-03 DIAGNOSIS — D509 Iron deficiency anemia, unspecified: Secondary | ICD-10-CM | POA: Diagnosis not present

## 2015-10-05 DIAGNOSIS — D631 Anemia in chronic kidney disease: Secondary | ICD-10-CM | POA: Diagnosis not present

## 2015-10-05 DIAGNOSIS — N186 End stage renal disease: Secondary | ICD-10-CM | POA: Diagnosis not present

## 2015-10-05 DIAGNOSIS — N2581 Secondary hyperparathyroidism of renal origin: Secondary | ICD-10-CM | POA: Diagnosis not present

## 2015-10-05 DIAGNOSIS — D509 Iron deficiency anemia, unspecified: Secondary | ICD-10-CM | POA: Diagnosis not present

## 2015-10-08 DIAGNOSIS — D509 Iron deficiency anemia, unspecified: Secondary | ICD-10-CM | POA: Diagnosis not present

## 2015-10-08 DIAGNOSIS — N186 End stage renal disease: Secondary | ICD-10-CM | POA: Diagnosis not present

## 2015-10-08 DIAGNOSIS — D631 Anemia in chronic kidney disease: Secondary | ICD-10-CM | POA: Diagnosis not present

## 2015-10-08 DIAGNOSIS — N2581 Secondary hyperparathyroidism of renal origin: Secondary | ICD-10-CM | POA: Diagnosis not present

## 2015-10-09 DIAGNOSIS — N186 End stage renal disease: Secondary | ICD-10-CM | POA: Diagnosis not present

## 2015-10-09 DIAGNOSIS — Z992 Dependence on renal dialysis: Secondary | ICD-10-CM | POA: Diagnosis not present

## 2015-10-09 DIAGNOSIS — N2889 Other specified disorders of kidney and ureter: Secondary | ICD-10-CM | POA: Diagnosis not present

## 2015-10-10 DIAGNOSIS — N186 End stage renal disease: Secondary | ICD-10-CM | POA: Diagnosis not present

## 2015-10-10 DIAGNOSIS — D509 Iron deficiency anemia, unspecified: Secondary | ICD-10-CM | POA: Diagnosis not present

## 2015-10-10 DIAGNOSIS — N2581 Secondary hyperparathyroidism of renal origin: Secondary | ICD-10-CM | POA: Diagnosis not present

## 2015-10-10 DIAGNOSIS — D631 Anemia in chronic kidney disease: Secondary | ICD-10-CM | POA: Diagnosis not present

## 2015-10-15 DIAGNOSIS — N2581 Secondary hyperparathyroidism of renal origin: Secondary | ICD-10-CM | POA: Diagnosis not present

## 2015-10-15 DIAGNOSIS — D509 Iron deficiency anemia, unspecified: Secondary | ICD-10-CM | POA: Diagnosis not present

## 2015-10-15 DIAGNOSIS — D631 Anemia in chronic kidney disease: Secondary | ICD-10-CM | POA: Diagnosis not present

## 2015-10-15 DIAGNOSIS — N186 End stage renal disease: Secondary | ICD-10-CM | POA: Diagnosis not present

## 2015-10-17 DIAGNOSIS — D509 Iron deficiency anemia, unspecified: Secondary | ICD-10-CM | POA: Diagnosis not present

## 2015-10-17 DIAGNOSIS — N2581 Secondary hyperparathyroidism of renal origin: Secondary | ICD-10-CM | POA: Diagnosis not present

## 2015-10-17 DIAGNOSIS — D631 Anemia in chronic kidney disease: Secondary | ICD-10-CM | POA: Diagnosis not present

## 2015-10-17 DIAGNOSIS — N186 End stage renal disease: Secondary | ICD-10-CM | POA: Diagnosis not present

## 2015-10-20 DIAGNOSIS — N186 End stage renal disease: Secondary | ICD-10-CM | POA: Diagnosis not present

## 2015-10-20 DIAGNOSIS — N2581 Secondary hyperparathyroidism of renal origin: Secondary | ICD-10-CM | POA: Diagnosis not present

## 2015-10-20 DIAGNOSIS — D631 Anemia in chronic kidney disease: Secondary | ICD-10-CM | POA: Diagnosis not present

## 2015-10-20 DIAGNOSIS — D509 Iron deficiency anemia, unspecified: Secondary | ICD-10-CM | POA: Diagnosis not present

## 2015-10-22 DIAGNOSIS — N186 End stage renal disease: Secondary | ICD-10-CM | POA: Diagnosis not present

## 2015-10-22 DIAGNOSIS — N2581 Secondary hyperparathyroidism of renal origin: Secondary | ICD-10-CM | POA: Diagnosis not present

## 2015-10-22 DIAGNOSIS — D509 Iron deficiency anemia, unspecified: Secondary | ICD-10-CM | POA: Diagnosis not present

## 2015-10-22 DIAGNOSIS — D631 Anemia in chronic kidney disease: Secondary | ICD-10-CM | POA: Diagnosis not present

## 2015-10-23 DIAGNOSIS — D509 Iron deficiency anemia, unspecified: Secondary | ICD-10-CM | POA: Diagnosis not present

## 2015-10-23 DIAGNOSIS — N186 End stage renal disease: Secondary | ICD-10-CM | POA: Diagnosis not present

## 2015-10-23 DIAGNOSIS — N2581 Secondary hyperparathyroidism of renal origin: Secondary | ICD-10-CM | POA: Diagnosis not present

## 2015-10-23 DIAGNOSIS — D631 Anemia in chronic kidney disease: Secondary | ICD-10-CM | POA: Diagnosis not present

## 2015-10-26 DIAGNOSIS — D631 Anemia in chronic kidney disease: Secondary | ICD-10-CM | POA: Diagnosis not present

## 2015-10-26 DIAGNOSIS — N186 End stage renal disease: Secondary | ICD-10-CM | POA: Diagnosis not present

## 2015-10-26 DIAGNOSIS — N2581 Secondary hyperparathyroidism of renal origin: Secondary | ICD-10-CM | POA: Diagnosis not present

## 2015-10-26 DIAGNOSIS — D509 Iron deficiency anemia, unspecified: Secondary | ICD-10-CM | POA: Diagnosis not present

## 2015-10-28 DIAGNOSIS — N2581 Secondary hyperparathyroidism of renal origin: Secondary | ICD-10-CM | POA: Diagnosis not present

## 2015-10-28 DIAGNOSIS — D631 Anemia in chronic kidney disease: Secondary | ICD-10-CM | POA: Diagnosis not present

## 2015-10-28 DIAGNOSIS — N186 End stage renal disease: Secondary | ICD-10-CM | POA: Diagnosis not present

## 2015-10-28 DIAGNOSIS — D509 Iron deficiency anemia, unspecified: Secondary | ICD-10-CM | POA: Diagnosis not present

## 2015-10-29 DIAGNOSIS — R310 Gross hematuria: Secondary | ICD-10-CM | POA: Insufficient documentation

## 2015-10-29 DIAGNOSIS — N529 Male erectile dysfunction, unspecified: Secondary | ICD-10-CM | POA: Diagnosis not present

## 2015-10-29 DIAGNOSIS — N400 Enlarged prostate without lower urinary tract symptoms: Secondary | ICD-10-CM | POA: Insufficient documentation

## 2015-10-29 DIAGNOSIS — N521 Erectile dysfunction due to diseases classified elsewhere: Secondary | ICD-10-CM | POA: Diagnosis not present

## 2015-10-30 DIAGNOSIS — D631 Anemia in chronic kidney disease: Secondary | ICD-10-CM | POA: Diagnosis not present

## 2015-10-30 DIAGNOSIS — N186 End stage renal disease: Secondary | ICD-10-CM | POA: Diagnosis not present

## 2015-10-30 DIAGNOSIS — N2581 Secondary hyperparathyroidism of renal origin: Secondary | ICD-10-CM | POA: Diagnosis not present

## 2015-10-30 DIAGNOSIS — D509 Iron deficiency anemia, unspecified: Secondary | ICD-10-CM | POA: Diagnosis not present

## 2015-11-02 DIAGNOSIS — D631 Anemia in chronic kidney disease: Secondary | ICD-10-CM | POA: Diagnosis not present

## 2015-11-02 DIAGNOSIS — N2581 Secondary hyperparathyroidism of renal origin: Secondary | ICD-10-CM | POA: Diagnosis not present

## 2015-11-02 DIAGNOSIS — N186 End stage renal disease: Secondary | ICD-10-CM | POA: Diagnosis not present

## 2015-11-02 DIAGNOSIS — D509 Iron deficiency anemia, unspecified: Secondary | ICD-10-CM | POA: Diagnosis not present

## 2015-11-04 DIAGNOSIS — N2581 Secondary hyperparathyroidism of renal origin: Secondary | ICD-10-CM | POA: Diagnosis not present

## 2015-11-04 DIAGNOSIS — N186 End stage renal disease: Secondary | ICD-10-CM | POA: Diagnosis not present

## 2015-11-04 DIAGNOSIS — D509 Iron deficiency anemia, unspecified: Secondary | ICD-10-CM | POA: Diagnosis not present

## 2015-11-04 DIAGNOSIS — D631 Anemia in chronic kidney disease: Secondary | ICD-10-CM | POA: Diagnosis not present

## 2015-11-06 DIAGNOSIS — D631 Anemia in chronic kidney disease: Secondary | ICD-10-CM | POA: Diagnosis not present

## 2015-11-06 DIAGNOSIS — N186 End stage renal disease: Secondary | ICD-10-CM | POA: Diagnosis not present

## 2015-11-06 DIAGNOSIS — N2581 Secondary hyperparathyroidism of renal origin: Secondary | ICD-10-CM | POA: Diagnosis not present

## 2015-11-06 DIAGNOSIS — D509 Iron deficiency anemia, unspecified: Secondary | ICD-10-CM | POA: Diagnosis not present

## 2015-11-08 DIAGNOSIS — N186 End stage renal disease: Secondary | ICD-10-CM | POA: Diagnosis not present

## 2015-11-08 DIAGNOSIS — Z992 Dependence on renal dialysis: Secondary | ICD-10-CM | POA: Diagnosis not present

## 2015-11-08 DIAGNOSIS — N2889 Other specified disorders of kidney and ureter: Secondary | ICD-10-CM | POA: Diagnosis not present

## 2015-11-09 DIAGNOSIS — D509 Iron deficiency anemia, unspecified: Secondary | ICD-10-CM | POA: Diagnosis not present

## 2015-11-09 DIAGNOSIS — N186 End stage renal disease: Secondary | ICD-10-CM | POA: Diagnosis not present

## 2015-11-09 DIAGNOSIS — N2581 Secondary hyperparathyroidism of renal origin: Secondary | ICD-10-CM | POA: Diagnosis not present

## 2015-11-09 DIAGNOSIS — D631 Anemia in chronic kidney disease: Secondary | ICD-10-CM | POA: Diagnosis not present

## 2015-11-11 DIAGNOSIS — D509 Iron deficiency anemia, unspecified: Secondary | ICD-10-CM | POA: Diagnosis not present

## 2015-11-11 DIAGNOSIS — N186 End stage renal disease: Secondary | ICD-10-CM | POA: Diagnosis not present

## 2015-11-11 DIAGNOSIS — D631 Anemia in chronic kidney disease: Secondary | ICD-10-CM | POA: Diagnosis not present

## 2015-11-11 DIAGNOSIS — N2581 Secondary hyperparathyroidism of renal origin: Secondary | ICD-10-CM | POA: Diagnosis not present

## 2015-11-13 DIAGNOSIS — D631 Anemia in chronic kidney disease: Secondary | ICD-10-CM | POA: Diagnosis not present

## 2015-11-13 DIAGNOSIS — N2581 Secondary hyperparathyroidism of renal origin: Secondary | ICD-10-CM | POA: Diagnosis not present

## 2015-11-13 DIAGNOSIS — D509 Iron deficiency anemia, unspecified: Secondary | ICD-10-CM | POA: Diagnosis not present

## 2015-11-13 DIAGNOSIS — N186 End stage renal disease: Secondary | ICD-10-CM | POA: Diagnosis not present

## 2015-11-16 DIAGNOSIS — D631 Anemia in chronic kidney disease: Secondary | ICD-10-CM | POA: Diagnosis not present

## 2015-11-16 DIAGNOSIS — N186 End stage renal disease: Secondary | ICD-10-CM | POA: Diagnosis not present

## 2015-11-16 DIAGNOSIS — N2581 Secondary hyperparathyroidism of renal origin: Secondary | ICD-10-CM | POA: Diagnosis not present

## 2015-11-16 DIAGNOSIS — D509 Iron deficiency anemia, unspecified: Secondary | ICD-10-CM | POA: Diagnosis not present

## 2015-11-18 DIAGNOSIS — D509 Iron deficiency anemia, unspecified: Secondary | ICD-10-CM | POA: Diagnosis not present

## 2015-11-18 DIAGNOSIS — D631 Anemia in chronic kidney disease: Secondary | ICD-10-CM | POA: Diagnosis not present

## 2015-11-18 DIAGNOSIS — N186 End stage renal disease: Secondary | ICD-10-CM | POA: Diagnosis not present

## 2015-11-18 DIAGNOSIS — N2581 Secondary hyperparathyroidism of renal origin: Secondary | ICD-10-CM | POA: Diagnosis not present

## 2015-11-20 DIAGNOSIS — N186 End stage renal disease: Secondary | ICD-10-CM | POA: Diagnosis not present

## 2015-11-20 DIAGNOSIS — N2581 Secondary hyperparathyroidism of renal origin: Secondary | ICD-10-CM | POA: Diagnosis not present

## 2015-11-20 DIAGNOSIS — D631 Anemia in chronic kidney disease: Secondary | ICD-10-CM | POA: Diagnosis not present

## 2015-11-20 DIAGNOSIS — D509 Iron deficiency anemia, unspecified: Secondary | ICD-10-CM | POA: Diagnosis not present

## 2015-11-23 DIAGNOSIS — D631 Anemia in chronic kidney disease: Secondary | ICD-10-CM | POA: Diagnosis not present

## 2015-11-23 DIAGNOSIS — D509 Iron deficiency anemia, unspecified: Secondary | ICD-10-CM | POA: Diagnosis not present

## 2015-11-23 DIAGNOSIS — N186 End stage renal disease: Secondary | ICD-10-CM | POA: Diagnosis not present

## 2015-11-23 DIAGNOSIS — N2581 Secondary hyperparathyroidism of renal origin: Secondary | ICD-10-CM | POA: Diagnosis not present

## 2015-11-25 DIAGNOSIS — D509 Iron deficiency anemia, unspecified: Secondary | ICD-10-CM | POA: Diagnosis not present

## 2015-11-25 DIAGNOSIS — N186 End stage renal disease: Secondary | ICD-10-CM | POA: Diagnosis not present

## 2015-11-25 DIAGNOSIS — D631 Anemia in chronic kidney disease: Secondary | ICD-10-CM | POA: Diagnosis not present

## 2015-11-25 DIAGNOSIS — N2581 Secondary hyperparathyroidism of renal origin: Secondary | ICD-10-CM | POA: Diagnosis not present

## 2015-11-27 DIAGNOSIS — D631 Anemia in chronic kidney disease: Secondary | ICD-10-CM | POA: Diagnosis not present

## 2015-11-27 DIAGNOSIS — N2581 Secondary hyperparathyroidism of renal origin: Secondary | ICD-10-CM | POA: Diagnosis not present

## 2015-11-27 DIAGNOSIS — D509 Iron deficiency anemia, unspecified: Secondary | ICD-10-CM | POA: Diagnosis not present

## 2015-11-27 DIAGNOSIS — N186 End stage renal disease: Secondary | ICD-10-CM | POA: Diagnosis not present

## 2015-11-30 DIAGNOSIS — N2581 Secondary hyperparathyroidism of renal origin: Secondary | ICD-10-CM | POA: Diagnosis not present

## 2015-11-30 DIAGNOSIS — D509 Iron deficiency anemia, unspecified: Secondary | ICD-10-CM | POA: Diagnosis not present

## 2015-11-30 DIAGNOSIS — D631 Anemia in chronic kidney disease: Secondary | ICD-10-CM | POA: Diagnosis not present

## 2015-11-30 DIAGNOSIS — N186 End stage renal disease: Secondary | ICD-10-CM | POA: Diagnosis not present

## 2015-12-02 DIAGNOSIS — N186 End stage renal disease: Secondary | ICD-10-CM | POA: Diagnosis not present

## 2015-12-02 DIAGNOSIS — N2581 Secondary hyperparathyroidism of renal origin: Secondary | ICD-10-CM | POA: Diagnosis not present

## 2015-12-02 DIAGNOSIS — D631 Anemia in chronic kidney disease: Secondary | ICD-10-CM | POA: Diagnosis not present

## 2015-12-02 DIAGNOSIS — D509 Iron deficiency anemia, unspecified: Secondary | ICD-10-CM | POA: Diagnosis not present

## 2015-12-04 DIAGNOSIS — D509 Iron deficiency anemia, unspecified: Secondary | ICD-10-CM | POA: Diagnosis not present

## 2015-12-04 DIAGNOSIS — N186 End stage renal disease: Secondary | ICD-10-CM | POA: Diagnosis not present

## 2015-12-04 DIAGNOSIS — N2581 Secondary hyperparathyroidism of renal origin: Secondary | ICD-10-CM | POA: Diagnosis not present

## 2015-12-04 DIAGNOSIS — D631 Anemia in chronic kidney disease: Secondary | ICD-10-CM | POA: Diagnosis not present

## 2015-12-07 DIAGNOSIS — D631 Anemia in chronic kidney disease: Secondary | ICD-10-CM | POA: Diagnosis not present

## 2015-12-07 DIAGNOSIS — N186 End stage renal disease: Secondary | ICD-10-CM | POA: Diagnosis not present

## 2015-12-07 DIAGNOSIS — N2581 Secondary hyperparathyroidism of renal origin: Secondary | ICD-10-CM | POA: Diagnosis not present

## 2015-12-07 DIAGNOSIS — D509 Iron deficiency anemia, unspecified: Secondary | ICD-10-CM | POA: Diagnosis not present

## 2015-12-09 DIAGNOSIS — N186 End stage renal disease: Secondary | ICD-10-CM | POA: Diagnosis not present

## 2015-12-09 DIAGNOSIS — Z992 Dependence on renal dialysis: Secondary | ICD-10-CM | POA: Diagnosis not present

## 2015-12-09 DIAGNOSIS — D509 Iron deficiency anemia, unspecified: Secondary | ICD-10-CM | POA: Diagnosis not present

## 2015-12-09 DIAGNOSIS — D631 Anemia in chronic kidney disease: Secondary | ICD-10-CM | POA: Diagnosis not present

## 2015-12-09 DIAGNOSIS — N2889 Other specified disorders of kidney and ureter: Secondary | ICD-10-CM | POA: Diagnosis not present

## 2015-12-09 DIAGNOSIS — N2581 Secondary hyperparathyroidism of renal origin: Secondary | ICD-10-CM | POA: Diagnosis not present

## 2015-12-14 DIAGNOSIS — N2581 Secondary hyperparathyroidism of renal origin: Secondary | ICD-10-CM | POA: Diagnosis not present

## 2015-12-14 DIAGNOSIS — D509 Iron deficiency anemia, unspecified: Secondary | ICD-10-CM | POA: Diagnosis not present

## 2015-12-14 DIAGNOSIS — D631 Anemia in chronic kidney disease: Secondary | ICD-10-CM | POA: Diagnosis not present

## 2015-12-14 DIAGNOSIS — N186 End stage renal disease: Secondary | ICD-10-CM | POA: Diagnosis not present

## 2015-12-18 ENCOUNTER — Encounter (HOSPITAL_COMMUNITY): Payer: Self-pay | Admitting: Emergency Medicine

## 2015-12-18 ENCOUNTER — Inpatient Hospital Stay (HOSPITAL_COMMUNITY)
Admission: EM | Admit: 2015-12-18 | Discharge: 2015-12-22 | DRG: 640 | Disposition: A | Discharge status: Home / Self Care | Payer: Medicare Other | Attending: Internal Medicine | Admitting: Internal Medicine

## 2015-12-18 ENCOUNTER — Emergency Department (HOSPITAL_COMMUNITY): Payer: Medicare Other

## 2015-12-18 DIAGNOSIS — E162 Hypoglycemia, unspecified: Secondary | ICD-10-CM | POA: Diagnosis present

## 2015-12-18 DIAGNOSIS — D631 Anemia in chronic kidney disease: Secondary | ICD-10-CM | POA: Diagnosis present

## 2015-12-18 DIAGNOSIS — N2581 Secondary hyperparathyroidism of renal origin: Secondary | ICD-10-CM | POA: Diagnosis not present

## 2015-12-18 DIAGNOSIS — I150 Renovascular hypertension: Secondary | ICD-10-CM | POA: Diagnosis not present

## 2015-12-18 DIAGNOSIS — Z5900 Homelessness unspecified: Secondary | ICD-10-CM

## 2015-12-18 DIAGNOSIS — Z992 Dependence on renal dialysis: Secondary | ICD-10-CM

## 2015-12-18 DIAGNOSIS — B1920 Unspecified viral hepatitis C without hepatic coma: Secondary | ICD-10-CM | POA: Diagnosis present

## 2015-12-18 DIAGNOSIS — N186 End stage renal disease: Secondary | ICD-10-CM | POA: Diagnosis present

## 2015-12-18 DIAGNOSIS — R531 Weakness: Secondary | ICD-10-CM | POA: Diagnosis not present

## 2015-12-18 DIAGNOSIS — Z833 Family history of diabetes mellitus: Secondary | ICD-10-CM

## 2015-12-18 DIAGNOSIS — E872 Acidosis: Secondary | ICD-10-CM | POA: Diagnosis not present

## 2015-12-18 DIAGNOSIS — N185 Chronic kidney disease, stage 5: Secondary | ICD-10-CM

## 2015-12-18 DIAGNOSIS — Z9119 Patient's noncompliance with other medical treatment and regimen: Secondary | ICD-10-CM

## 2015-12-18 DIAGNOSIS — F1910 Other psychoactive substance abuse, uncomplicated: Secondary | ICD-10-CM | POA: Diagnosis present

## 2015-12-18 DIAGNOSIS — E875 Hyperkalemia: Principal | ICD-10-CM | POA: Diagnosis present

## 2015-12-18 DIAGNOSIS — Z79899 Other long term (current) drug therapy: Secondary | ICD-10-CM

## 2015-12-18 DIAGNOSIS — R030 Elevated blood-pressure reading, without diagnosis of hypertension: Secondary | ICD-10-CM | POA: Diagnosis not present

## 2015-12-18 DIAGNOSIS — Z8249 Family history of ischemic heart disease and other diseases of the circulatory system: Secondary | ICD-10-CM

## 2015-12-18 DIAGNOSIS — R0602 Shortness of breath: Secondary | ICD-10-CM

## 2015-12-18 DIAGNOSIS — Z87891 Personal history of nicotine dependence: Secondary | ICD-10-CM

## 2015-12-18 DIAGNOSIS — I120 Hypertensive chronic kidney disease with stage 5 chronic kidney disease or end stage renal disease: Secondary | ICD-10-CM | POA: Diagnosis not present

## 2015-12-18 LAB — CBC WITH DIFFERENTIAL/PLATELET
Basophils Absolute: 0 10*3/uL (ref 0.0–0.1)
Basophils Relative: 0 %
EOS ABS: 0 10*3/uL (ref 0.0–0.7)
EOS PCT: 1 %
HCT: 26.9 % — ABNORMAL LOW (ref 39.0–52.0)
Hemoglobin: 8.5 g/dL — ABNORMAL LOW (ref 13.0–17.0)
LYMPHS ABS: 1.8 10*3/uL (ref 0.7–4.0)
Lymphocytes Relative: 52 %
MCH: 30.5 pg (ref 26.0–34.0)
MCHC: 31.6 g/dL (ref 30.0–36.0)
MCV: 96.4 fL (ref 78.0–100.0)
Monocytes Absolute: 0.4 10*3/uL (ref 0.1–1.0)
Monocytes Relative: 11 %
NEUTROS PCT: 36 %
Neutro Abs: 1.3 10*3/uL — ABNORMAL LOW (ref 1.7–7.7)
PLATELETS: 140 10*3/uL — AB (ref 150–400)
RBC: 2.79 MIL/uL — AB (ref 4.22–5.81)
RDW: 17.4 % — AB (ref 11.5–15.5)
WBC: 3.5 10*3/uL — AB (ref 4.0–10.5)

## 2015-12-18 LAB — I-STAT TROPONIN, ED: TROPONIN I, POC: 0.03 ng/mL (ref 0.00–0.08)

## 2015-12-18 LAB — I-STAT CHEM 8, ED
BUN: 79 mg/dL — AB (ref 6–20)
CALCIUM ION: 0.95 mmol/L — AB (ref 1.13–1.30)
CHLORIDE: 99 mmol/L — AB (ref 101–111)
Creatinine, Ser: >18 mg/dL — ABNORMAL HIGH (ref 0.61–1.24)
Glucose, Bld: 80 mg/dL (ref 65–99)
HEMATOCRIT: 30 % — AB (ref 39.0–52.0)
Hemoglobin: 10.2 g/dL — ABNORMAL LOW (ref 13.0–17.0)
Potassium: 6.5 mmol/L (ref 3.5–5.1)
SODIUM: 133 mmol/L — AB (ref 135–145)
TCO2: 20 mmol/L (ref 0–100)

## 2015-12-18 LAB — COMPREHENSIVE METABOLIC PANEL
ALT: 8 U/L — AB (ref 17–63)
ANION GAP: 21 — AB (ref 5–15)
AST: 6 U/L — ABNORMAL LOW (ref 15–41)
Albumin: 3.2 g/dL — ABNORMAL LOW (ref 3.5–5.0)
Alkaline Phosphatase: 57 U/L (ref 38–126)
BUN: 84 mg/dL — ABNORMAL HIGH (ref 6–20)
CHLORIDE: 95 mmol/L — AB (ref 101–111)
CO2: 19 mmol/L — AB (ref 22–32)
CREATININE: 20.44 mg/dL — AB (ref 0.61–1.24)
Calcium: 8.7 mg/dL — ABNORMAL LOW (ref 8.9–10.3)
GFR, EST AFRICAN AMERICAN: 2 mL/min — AB (ref >60)
GFR, EST NON AFRICAN AMERICAN: 2 mL/min — AB (ref >60)
Glucose, Bld: 82 mg/dL (ref 65–99)
Potassium: 6.7 mmol/L (ref 3.5–5.1)
SODIUM: 135 mmol/L (ref 135–145)
Total Bilirubin: 0.7 mg/dL (ref 0.3–1.2)
Total Protein: 7.2 g/dL (ref 6.5–8.1)

## 2015-12-18 LAB — CBG MONITORING, ED
GLUCOSE-CAPILLARY: 65 mg/dL (ref 65–99)
GLUCOSE-CAPILLARY: 73 mg/dL (ref 65–99)

## 2015-12-18 IMAGING — DX DG CHEST 1V PORT
1 series · 1 of 1 positions shown · non-contrast
Comparison: [DATE]

CLINICAL DATA: Weakness for 3 weeks, missed dialysis

EXAM:
PORTABLE CHEST 1 VIEW

[chest ap]
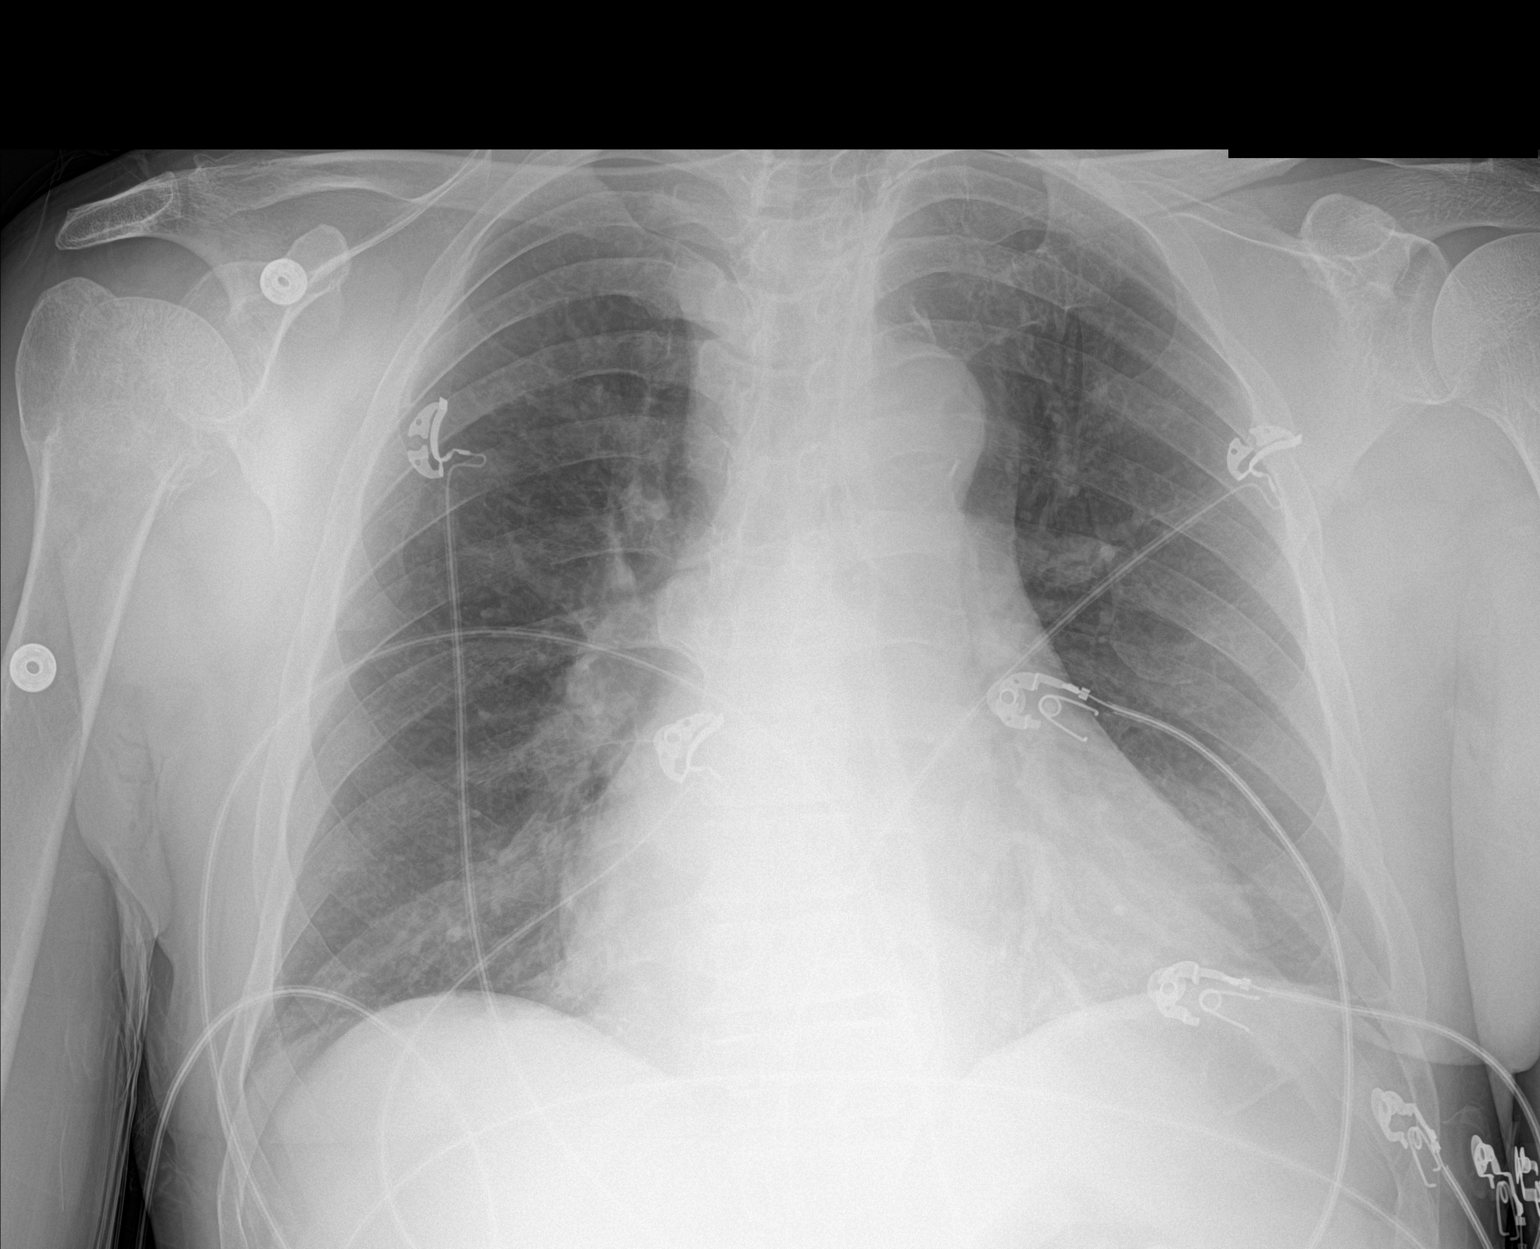

[1 of 1 positions shown; findings below may reference images not displayed]

FINDINGS: Borderline cardiomegaly. No acute infiltrate or pleural effusion. No
pulmonary edema. Mild degenerative changes thoracic spine.
IMPRESSION: No active disease.

## 2015-12-18 MED ORDER — ALBUTEROL SULFATE (2.5 MG/3ML) 0.083% IN NEBU
10.0000 mg | INHALATION_SOLUTION | Freq: Once | RESPIRATORY_TRACT | Status: AC
  Administered 2015-12-18: 10 mg via RESPIRATORY_TRACT
  Filled 2015-12-18: qty 12

## 2015-12-18 MED ORDER — DEXTROSE 50 % IV SOLN
1.0000 | Freq: Once | INTRAVENOUS | Status: AC
  Administered 2015-12-18: 50 mL via INTRAVENOUS
  Filled 2015-12-18: qty 50

## 2015-12-18 MED ORDER — METOPROLOL TARTRATE 5 MG/5ML IV SOLN
5.0000 mg | Freq: Once | INTRAVENOUS | Status: AC
  Administered 2015-12-18: 5 mg via INTRAVENOUS
  Filled 2015-12-18: qty 5

## 2015-12-18 MED ORDER — SODIUM POLYSTYRENE SULFONATE 15 GM/60ML PO SUSP
30.0000 g | Freq: Once | ORAL | Status: DC
  Filled 2015-12-18: qty 120

## 2015-12-18 MED ORDER — INSULIN ASPART 100 UNIT/ML IV SOLN
5.0000 [IU] | Freq: Once | INTRAVENOUS | Status: AC
  Administered 2015-12-18: 5 [IU] via INTRAVENOUS
  Filled 2015-12-18: qty 1

## 2015-12-18 MED ORDER — SODIUM CHLORIDE 0.9 % IV SOLN
1.0000 g | Freq: Once | INTRAVENOUS | Status: AC
  Administered 2015-12-18: 1 g via INTRAVENOUS
  Filled 2015-12-18: qty 10

## 2015-12-18 MED ORDER — SODIUM BICARBONATE 8.4 % IV SOLN
Freq: Once | INTRAVENOUS | Status: AC
  Administered 2015-12-18: 23:00:00 via INTRAVENOUS
  Filled 2015-12-18: qty 100

## 2015-12-18 NOTE — Consult Note (Signed)
Raymond [name] 12/18/2015 Rexene Agent Requesting Physician:  Ralene Bathe MD  Reason for Consult:  Hyperkalemia, ESRD HPI:  62M with ESRD on in center hemodialysis at the [facility] who presented to the emergency room this eveningith weakness and having missed his last 2 dialysis treatments.Last treatme was 6/5 and was cut short. He did come near his dry weight.  The patient to engage with me screaming "Leave Me Alone" nd then not questions, only ccasionally stating that he  Feels sick. He was found to have a potassium of 6.5 and EKG changes with peaked T's. This is similar to an EKG from 2014 when hisEKG was normal.He is hypertertensive No bradyardia.  +AVF LFA with B/T.  1V CXR w/o edema.  On RA, breathign comfortably.  Rec albuterol, insulin,CaCl in ED. Pt refused kayexalate.     Filed Weights   12/18/15 2207  Weight: 72.576 kg (160 lb)       ROS Balance of 12 systems is negative w/ exceptions as above  Outpt HD Orders Unit: [facility] Days: MWF Time: 4h Dialyzer: F180 EDW: 70kg K/Ca: 2/2 Access: LFA AVF Needle Size: 15g BFR/DFR: 450/800 UF Proflie: 4 VDRA: calcitriol 1.110mcg qTx EPO: Mircera 225 q2wk, was due 6/7 IV Fe: none Heparin: 2000 IVB Treatment Adherence: poor  PMH  Past Medical History  Diagnosis Date  . Chronic kidney disease   . Hypertension   . Anemia   . Thyroid disease   . Hepatitis C     Still positive s/p liver biopsy at [facility]  and interferon therapy for 6 months. Most recent lab work was on 10/24/12  . Substance abuse    PSH  Past Surgical History  Procedure Laterality Date  . Liver biopsy    . Av fistula placement Left Aug. 2013 ?  [name] Shuntogram N/A 12/11/2012    Procedure: Earney Mallet;  Surgeon: Serafina Mitchell, MD;  Location: [facility] CATH LAB;  Service: Cardiovascular;  Laterality: N/A;   FH  Family History  Problem Relation Age of Onset  . Diabetes Father   . Hypertension Father    SH  reports that he quit smoking about 4 years ago.  He has never used smokeless tobacco. He reports that he drinks alcohol. He reports that he uses illicit drugs (Cocaine and Marijuana). Allergies No Known Allergies Home medications Prior to Admission medications   Medication Sig Start Date End Date Taking? Authorizing Provider  metoprolol succinate (TOPROL-XL) 25 MG 24 hr tablet Take 25 mg by mouth daily.    Historical Provider, MD  sevelamer (RENAGEL) 800 MG tablet Take 800 mg by mouth 3 (three) times daily with meals.    Historical Provider, MD  thiamine (VITAMIN B-1) 100 MG tablet Take 100 mg by mouth daily.    Historical Provider, MD  vitamin B-12 (CYANOCOBALAMIN) 1000 MCG tablet Take 1,000 mcg by mouth daily.    Historical Provider, MD    Current Medications Scheduled Meds: . sodium polystyrene  30 g Oral Once   Continuous Infusions: .  sodium bicarbonate  infusion 1000 mL 100 mL/hr at 12/18/15 2325   PRN Meds:.  CBC  Recent Labs Lab 12/18/15 2210 12/18/15 2218  WBC 3.5*  --   NEUTROABS 1.3*  --   HGB 8.5* 10.2*  HCT 26.9* 30.0*  MCV 96.4  --   PLT 140*  --    Basic Metabolic Panel  Recent Labs Lab 12/18/15 2210 12/18/15 2218  NA 135 133*  K 6.7* 6.5*  CL  95* 99*  CO2 19*  --   GLUCOSE 82 80  BUN 84* 79*  CREATININE 20.44* >18.00*  CALCIUM 8.7*  --     Physical Exam  Blood pressure 169/106, pulse 73, temperature 98.4 F (36.9 C), temperature source Oral, resp. rate 29, height 5\' 7"  (1.702 m), weight 72.576 kg (160 lb), SpO2 92 %. GEN: NAD ENT: NCAT EYES: unable to examine CV: RRR, no rub, nl s1s2 PULM: CTAB ABD: s/nt/n SKIN: no rashes/lesions EXT:no LEE LFA AVF+B/T   A 1. ESRD MWF via AVF at [facility] 2. Hyperkalemia, mild; EKG with some peaked Ts but also consistent with chronic chagnes  3. Nonadherence to Tx: unable to explore 4. HTN/Vol: nears EDW as outpt 5. Anemia: missed ESA 6/7  P 1. HD in AM if stays stable, first call   Pearson Grippe MD 12/18/2015, 11:42 PM

## 2015-12-18 NOTE — ED Notes (Signed)
Port xray at bedside.  

## 2015-12-18 NOTE — ED Notes (Signed)
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

## 2015-12-18 NOTE — Care Management (Signed)
Patient noted to be without PCP, reviewed patient's record patient is ESRD on HD last HD on Monday. CM met with patient at bedside, patient reports being evicted from his apartment today, and is now homeless. ED eval  SOB, K+6.7 Creat 20, BUN 77. HD is imminent. CSW consult placed

## 2015-12-18 NOTE — ED Notes (Signed)
[place] [facility] [place] x 3 wks; pt reports last HD treatment was Wednesday Dec 09, 2015; EMS reports hypertension; no meds given

## 2015-12-18 NOTE — ED Notes (Signed)
Mariann Laster SW at bedside

## 2015-12-18 NOTE — ED Notes (Signed)
Nephrology at bedside

## 2015-12-18 NOTE — ED Provider Notes (Signed)
CSN: [id number]     Arrival date & time 12/18/15  2200 History   First MD Initiated Contact with Patient 12/18/15 2208     Chief Complaint  Patient presents with  . Weakness  . Vascular Access Problem    needs dialysis     (Consider location/radiation/quality/duration/timing/severity/associated sxs/prior Treatment) The history is provided by the patient and medical records. No language interpreter was used.     Raymond [name] is a 60 y.o. male  with a hx of ESRD on dialysis (M, W, F - last on 12/14/15 and 12/09/15 before that), HTN, anemia, Hep C, substance abuse, presents to the Emergency Department complaining of gradual, persistent, progressively worsening weakness x 3 weeks.  Associated symptoms include SOB onset today.  Pt admits to skipping dialysis treatments as the electricity wasx cut off at his house and he didn't want to go to dialysis smelling poorly. He also reports that yesterday he was evicted from his home.  He reports he has not been taking any of his medications as prescribed.  Pt is a poor historian and cannot tell me if there are aggravating or alleviating factors to his SOB.  Pt denies fever, chills, headache, neck pain, chest pain, abd pain, N/V/D, syncope.     Past Medical History  Diagnosis Date  . Chronic kidney disease   . Hypertension   . Anemia   . Thyroid disease   . Hepatitis C     Still positive s/p liver biopsy at [facility]  and interferon therapy for 6 months. Most recent lab work was on 10/24/12  . Substance abuse    Past Surgical History  Procedure Laterality Date  . Liver biopsy    . Av fistula placement Left Aug. 2013 ?  [name] Shuntogram N/A 12/11/2012    Procedure: Earney Mallet;  Surgeon: Serafina Mitchell, MD;  Location: [facility] CATH LAB;  Service: Cardiovascular;  Laterality: N/A;   Family History  Problem Relation Age of Onset  . Diabetes Father   . Hypertension Father    Social History  Substance Use Topics  . Smoking status: Former Smoker -- 25 years   Quit date: 08/01/2011  . Smokeless tobacco: Never Used  . Alcohol Use: Yes     Comment: 3 shots of liquor today (12/18/15)    Review of Systems  Constitutional: Positive for fatigue. Negative for fever, diaphoresis, appetite change and unexpected weight change.  HENT: Negative for mouth sores.   Eyes: Negative for visual disturbance.  Respiratory: Positive for chest tightness and shortness of breath. Negative for cough and wheezing.   Cardiovascular: Negative for chest pain.  Gastrointestinal: Negative for nausea, vomiting, abdominal pain, diarrhea and constipation.  Endocrine: Negative for polydipsia, polyphagia and polyuria.  Genitourinary: Negative for dysuria, urgency, frequency and hematuria.  Musculoskeletal: Negative for back pain and neck stiffness.  Skin: Negative for rash.  Allergic/Immunologic: Negative for immunocompromised state.  Neurological: Positive for weakness. Negative for syncope, light-headedness and headaches.  Hematological: Does not bruise/bleed easily.  Psychiatric/Behavioral: Negative for sleep disturbance. The patient is not nervous/anxious.       Allergies  Review of patient's allergies indicates no known allergies.  Home Medications   Prior to Admission medications   Medication Sig Start Date End Date Taking? Authorizing Provider  metoprolol succinate (TOPROL-XL) 25 MG 24 hr tablet Take 25 mg by mouth daily.    Historical Provider, MD  sevelamer (RENAGEL) 800 MG tablet Take 800 mg by mouth 3 (three) times daily with meals.  Historical Provider, MD  thiamine (VITAMIN B-1) 100 MG tablet Take 100 mg by mouth daily.    Historical Provider, MD  vitamin B-12 (CYANOCOBALAMIN) 1000 MCG tablet Take 1,000 mcg by mouth daily.    Historical Provider, MD   BP 180/111 mmHg  Pulse 90  Temp(Src) 98.4 F (36.9 C) (Oral)  Resp 13  Ht 5\' 7"  (1.702 m)  Wt 72.576 kg  BMI 25.05 kg/m2  SpO2 99% Physical Exam  Constitutional: He is oriented to person, place, and  time. He appears well-developed and well-nourished. No distress.  Awake, alert, nontoxic appearance  HENT:  Head: Normocephalic and atraumatic.  Mouth/Throat: Oropharynx is clear and moist. No oropharyngeal exudate.  Eyes: Conjunctivae are normal. No scleral icterus.  Neck: Normal range of motion. Neck supple.  Cardiovascular: Normal rate, regular rhythm, normal heart sounds and intact distal pulses.   Pulmonary/Chest: Effort normal. No respiratory distress. He has decreased breath sounds. He has rales in the left lower field.  Equal chest expansion  Abdominal: Soft. Bowel sounds are normal. He exhibits no mass. There is no tenderness. There is no rebound and no guarding.  Musculoskeletal: Normal range of motion. He exhibits edema.  Left leg edema - pt reports chronic  Neurological: He is alert and oriented to person, place, and time.  Speech is clear and goal oriented Moves extremities without ataxia  Skin: Skin is warm and dry. He is not diaphoretic.  Psychiatric: He has a normal mood and affect.  Nursing note and vitals reviewed.   ED Course  Procedures (including critical care time) Labs Review Labs Reviewed  CBC WITH DIFFERENTIAL/PLATELET - Abnormal; Notable for the following:    WBC 3.5 (*)    RBC 2.79 (*)    Hemoglobin 8.5 (*)    HCT 26.9 (*)    RDW 17.4 (*)    Platelets 140 (*)    Neutro Abs 1.3 (*)    All other components within normal limits  COMPREHENSIVE METABOLIC PANEL - Abnormal; Notable for the following:    Potassium 6.7 (*)    Chloride 95 (*)    CO2 19 (*)    BUN 84 (*)    Creatinine, Ser 20.44 (*)    Calcium 8.7 (*)    Albumin 3.2 (*)    AST 6 (*)    ALT 8 (*)    GFR calc non Af Amer 2 (*)    GFR calc Af Amer 2 (*)    Anion gap 21 (*)    All other components within normal limits  I-STAT CHEM 8, ED - Abnormal; Notable for the following:    Sodium 133 (*)    Potassium 6.5 (*)    Chloride 99 (*)    BUN 79 (*)    Creatinine, Ser >18.00 (*)     Calcium, Ion 0.95 (*)    Hemoglobin 10.2 (*)    HCT 30.0 (*)    All other components within normal limits  I-STAT CHEM 8, ED - Abnormal; Notable for the following:    Potassium 5.2 (*)    Chloride 100 (*)    BUN 80 (*)    Creatinine, Ser >18.00 (*)    Glucose, Bld 48 (*)    Calcium, Ion 1.07 (*)    Hemoglobin 10.2 (*)    HCT 30.0 (*)    All other components within normal limits  I-STAT TROPOININ, ED  CBG MONITORING, ED  CBG MONITORING, ED    Imaging Review Dg Chest [facility] 1 [address]  12/18/2015  CLINICAL DATA:  Weakness for 3 weeks, missed dialysis EXAM: PORTABLE CHEST 1 VIEW COMPARISON:  10/22/2013 FINDINGS: Borderline cardiomegaly. No acute infiltrate or pleural effusion. No pulmonary edema. Mild degenerative changes thoracic spine. IMPRESSION: No active disease. Electronically Signed   By: Lahoma Crocker M.D.   On: 12/18/2015 22:41   I have personally reviewed and evaluated these images and lab results as part of my medical decision-making.   EKG Interpretation   Date/Time:  Friday December 18 2015 22:07:26 EDT Ventricular Rate:  85 PR Interval:  197 QRS Duration: 94 QT Interval:  412 QTC Calculation: 490 R Axis:   73 Text Interpretation:  Sinus rhythm Abnrm T, consider ischemia,  anterolateral lds Confirmed by Hazle Coca [phone]) on 12/18/2015 10:10:34 PM      EKG Interpretation  Date/Time:  Friday December 18 2015 23:43:12 EDT Ventricular Rate:  85 PR Interval:  216 QRS Duration: 91 QT Interval:  397 QTC Calculation: 472 R Axis:   74 Text Interpretation:  Sinus rhythm Borderline prolonged PR interval Probable anteroseptal infarct, old Confirmed by Hazle Coca [phone]) on 12/19/2015 12:39:49 AM       CRITICAL CARE Performed by: Abigail Butts Total critical care time: 60 minutes Critical care time was exclusive of separately billable procedures and treating other patients. Critical care was necessary to treat or prevent imminent or life-threatening deterioration. Critical  care was time spent personally by me on the following activities: development of treatment plan with patient and/or surrogate as well as nursing, discussions with consultants, evaluation of patient's response to treatment, examination of patient, obtaining history from patient or surrogate, ordering and performing treatments and interventions, ordering and review of laboratory studies, ordering and review of radiographic studies, pulse oximetry and re-evaluation of patient's condition.   MDM   Final diagnoses:  Renovascular hypertension  Hyperkalemia  End stage renal disease (HCC)  SOB (shortness of breath)   [place] E L[place]in presents with SOB and weakness after missing dialysis for a full week.  K 6.5 and pt with ECG changes including large, hyperacute, peaked T waves.    10:45 PM Discussed with Dr. Joelyn Oms of nephrology who requests medical temporizing of his hyperkalemia and repeat ECG/labs after this is complete.  If K has decreased pt may be admitted to medicine, if not, reconsult to nephrology.    The patient was discussed with and seen by Dr. Ralene Bathe who agrees with the treatment plan.  11:34 PM Temporizing treatments completed.  NaHCO3 is currently infusing.  Pt reports continued SOB.  Neg trop.  Creat 20.44 with AG of 21. CXR without overt pulmonary edema.   Pt with worsening HTN in the ED, noncompliant with his metoprolol.  Will give Metoprolol 5mg  IV.    12:37 AM Discussed with Dr. Fuller Plan who will admit to stepdown.  Improvement in K now at 5.2 but no improvement in ECG.  Pt has been evaluated by Dr. Joelyn Oms.  BP is improving with metoprolol, though he remains hypertensive.   Jarrett Soho Lucien Budney, PA-C 12/19/15 [id number]  Quintella Reichert, MD 12/22/15 1534

## 2015-12-18 NOTE — ED Notes (Signed)
Pt now states his last HD treatment was Monday (12/14/15)

## 2015-12-19 DIAGNOSIS — D638 Anemia in other chronic diseases classified elsewhere: Secondary | ICD-10-CM | POA: Diagnosis not present

## 2015-12-19 DIAGNOSIS — N186 End stage renal disease: Secondary | ICD-10-CM | POA: Diagnosis not present

## 2015-12-19 DIAGNOSIS — E875 Hyperkalemia: Principal | ICD-10-CM

## 2015-12-19 DIAGNOSIS — N185 Chronic kidney disease, stage 5: Secondary | ICD-10-CM

## 2015-12-19 DIAGNOSIS — D631 Anemia in chronic kidney disease: Secondary | ICD-10-CM | POA: Diagnosis present

## 2015-12-19 DIAGNOSIS — E162 Hypoglycemia, unspecified: Secondary | ICD-10-CM | POA: Diagnosis present

## 2015-12-19 DIAGNOSIS — F1910 Other psychoactive substance abuse, uncomplicated: Secondary | ICD-10-CM

## 2015-12-19 DIAGNOSIS — Z5900 Homelessness unspecified: Secondary | ICD-10-CM

## 2015-12-19 DIAGNOSIS — Z992 Dependence on renal dialysis: Secondary | ICD-10-CM | POA: Diagnosis not present

## 2015-12-19 DIAGNOSIS — I120 Hypertensive chronic kidney disease with stage 5 chronic kidney disease or end stage renal disease: Secondary | ICD-10-CM | POA: Diagnosis not present

## 2015-12-19 LAB — CBC
HCT: 27.3 % — ABNORMAL LOW (ref 39.0–52.0)
Hemoglobin: 8.4 g/dL — ABNORMAL LOW (ref 13.0–17.0)
MCH: 29.7 pg (ref 26.0–34.0)
MCHC: 30.8 g/dL (ref 30.0–36.0)
MCV: 96.5 fL (ref 78.0–100.0)
Platelets: 129 10*3/uL — ABNORMAL LOW (ref 150–400)
RBC: 2.83 MIL/uL — ABNORMAL LOW (ref 4.22–5.81)
RDW: 17.3 % — AB (ref 11.5–15.5)
WBC: 3.1 10*3/uL — ABNORMAL LOW (ref 4.0–10.5)

## 2015-12-19 LAB — RENAL FUNCTION PANEL
ALBUMIN: 2.7 g/dL — AB (ref 3.5–5.0)
Anion gap: 22 — ABNORMAL HIGH (ref 5–15)
BUN: 85 mg/dL — AB (ref 6–20)
CALCIUM: 9 mg/dL (ref 8.9–10.3)
CO2: 22 mmol/L (ref 22–32)
Chloride: 93 mmol/L — ABNORMAL LOW (ref 101–111)
Creatinine, Ser: 20.49 mg/dL — ABNORMAL HIGH (ref 0.61–1.24)
GFR calc Af Amer: 2 mL/min — ABNORMAL LOW (ref >60)
GFR calc non Af Amer: 2 mL/min — ABNORMAL LOW (ref >60)
GLUCOSE: 65 mg/dL (ref 65–99)
PHOSPHORUS: 11.4 mg/dL — AB (ref 2.5–4.6)
POTASSIUM: 5.6 mmol/L — AB (ref 3.5–5.1)
SODIUM: 137 mmol/L (ref 135–145)

## 2015-12-19 LAB — I-STAT CHEM 8, ED
BUN: 80 mg/dL — ABNORMAL HIGH (ref 6–20)
CALCIUM ION: 1.07 mmol/L — AB (ref 1.13–1.30)
CHLORIDE: 100 mmol/L — AB (ref 101–111)
GLUCOSE: 48 mg/dL — AB (ref 65–99)
HCT: 30 % — ABNORMAL LOW (ref 39.0–52.0)
Hemoglobin: 10.2 g/dL — ABNORMAL LOW (ref 13.0–17.0)
Potassium: 5.2 mmol/L — ABNORMAL HIGH (ref 3.5–5.1)
Sodium: 135 mmol/L (ref 135–145)
TCO2: 23 mmol/L (ref 0–100)

## 2015-12-19 LAB — CBG MONITORING, ED
GLUCOSE-CAPILLARY: 49 mg/dL — AB (ref 65–99)
Glucose-Capillary: 125 mg/dL — ABNORMAL HIGH (ref 65–99)
Glucose-Capillary: 78 mg/dL (ref 65–99)

## 2015-12-19 LAB — GLUCOSE, CAPILLARY
GLUCOSE-CAPILLARY: 110 mg/dL — AB (ref 65–99)
GLUCOSE-CAPILLARY: 117 mg/dL — AB (ref 65–99)
GLUCOSE-CAPILLARY: 121 mg/dL — AB (ref 65–99)
GLUCOSE-CAPILLARY: 105 mg/dL — AB (ref 65–99)
GLUCOSE-CAPILLARY: 92 mg/dL (ref 65–99)
Glucose-Capillary: 57 mg/dL — ABNORMAL LOW (ref 65–99)
Glucose-Capillary: 58 mg/dL — ABNORMAL LOW (ref 65–99)
Glucose-Capillary: 105 mg/dL — ABNORMAL HIGH (ref 65–99)
Glucose-Capillary: 41 mg/dL — CL (ref 65–99)
Glucose-Capillary: 90 mg/dL (ref 65–99)
Glucose-Capillary: 100 mg/dL — ABNORMAL HIGH (ref 65–99)
Glucose-Capillary: 87 mg/dL (ref 65–99)
Glucose-Capillary: 91 mg/dL (ref 65–99)
Glucose-Capillary: 117 mg/dL — ABNORMAL HIGH (ref 65–99)

## 2015-12-19 LAB — MRSA PCR SCREENING: MRSA BY PCR: NEGATIVE

## 2015-12-19 MED ORDER — SODIUM CHLORIDE 0.9 % IV SOLN: 100.0000 mL | INTRAVENOUS | Status: DC | PRN

## 2015-12-19 MED ORDER — SODIUM CHLORIDE 0.9% FLUSH
3.0000 mL | Freq: Two times a day (BID) | INTRAVENOUS | Status: DC
  Administered 2015-12-19 – 2015-12-21 (×5): 3 mL via INTRAVENOUS

## 2015-12-19 MED ORDER — VITAMIN B-12 1000 MCG PO TABS
1000.0000 ug | ORAL_TABLET | Freq: Every day | ORAL | Status: DC
  Administered 2015-12-19 – 2015-12-22 (×4): 1000 ug via ORAL
  Filled 2015-12-19 (×4): qty 1

## 2015-12-19 MED ORDER — ONDANSETRON HCL 4 MG PO TABS: 4.0000 mg | ORAL_TABLET | Freq: Four times a day (QID) | ORAL | Status: DC | PRN

## 2015-12-19 MED ORDER — ONDANSETRON HCL 4 MG/2ML IJ SOLN
INTRAMUSCULAR | Status: AC
Start: 2015-12-19 — End: 2015-12-19
  Filled 2015-12-19: qty 2

## 2015-12-19 MED ORDER — LIDOCAINE HCL (PF) 1 % IJ SOLN: 5.0000 mL | INTRAMUSCULAR | Status: DC | PRN

## 2015-12-19 MED ORDER — FOLIC ACID 1 MG PO TABS
1.0000 mg | ORAL_TABLET | Freq: Every day | ORAL | Status: DC
  Administered 2015-12-19 – 2015-12-22 (×4): 1 mg via ORAL
  Filled 2015-12-19 (×4): qty 1

## 2015-12-19 MED ORDER — HYDRALAZINE HCL 20 MG/ML IJ SOLN
10.0000 mg | INTRAMUSCULAR | Status: DC | PRN
  Administered 2015-12-20 (×2): 10 mg via INTRAVENOUS
  Filled 2015-12-19 (×2): qty 1

## 2015-12-19 MED ORDER — DEXTROSE 50 % IV SOLN
50.0000 mL | Freq: Once | INTRAVENOUS | Status: AC
  Administered 2015-12-19: 50 mL via INTRAVENOUS
  Filled 2015-12-19: qty 50

## 2015-12-19 MED ORDER — HEPARIN SODIUM (PORCINE) 1000 UNIT/ML DIALYSIS: 20.0000 [IU]/kg | INTRAMUSCULAR | Status: DC | PRN

## 2015-12-19 MED ORDER — ACETAMINOPHEN 650 MG RE SUPP: 650.0000 mg | Freq: Four times a day (QID) | RECTAL | Status: DC | PRN

## 2015-12-19 MED ORDER — LORAZEPAM 2 MG/ML IJ SOLN
1.0000 mg | Freq: Four times a day (QID) | INTRAMUSCULAR | Status: AC | PRN
  Administered 2015-12-19: 1 mg via INTRAVENOUS
  Filled 2015-12-19: qty 1

## 2015-12-19 MED ORDER — HEPARIN SODIUM (PORCINE) 5000 UNIT/ML IJ SOLN
5000.0000 [IU] | Freq: Three times a day (TID) | INTRAMUSCULAR | Status: DC
  Administered 2015-12-19 – 2015-12-22 (×7): 5000 [IU] via SUBCUTANEOUS
  Filled 2015-12-19 (×7): qty 1

## 2015-12-19 MED ORDER — LIDOCAINE-PRILOCAINE 2.5-2.5 % EX CREA: 1.0000 "application " | TOPICAL_CREAM | CUTANEOUS | Status: DC | PRN

## 2015-12-19 MED ORDER — PENTAFLUOROPROP-TETRAFLUOROETH EX AERO: 1.0000 "application " | INHALATION_SPRAY | CUTANEOUS | Status: DC | PRN

## 2015-12-19 MED ORDER — LORAZEPAM 1 MG PO TABS
1.0000 mg | ORAL_TABLET | Freq: Four times a day (QID) | ORAL | Status: AC | PRN
  Administered 2015-12-20: 1 mg via ORAL
  Filled 2015-12-19: qty 1

## 2015-12-19 MED ORDER — ACETAMINOPHEN 325 MG PO TABS
650.0000 mg | ORAL_TABLET | Freq: Four times a day (QID) | ORAL | Status: DC | PRN
  Administered 2015-12-19 – 2015-12-20 (×2): 650 mg via ORAL
  Filled 2015-12-19 (×2): qty 2

## 2015-12-19 MED ORDER — DEXTROSE 50 % IV SOLN
INTRAVENOUS | Status: AC
  Administered 2015-12-19: 50 mL
  Filled 2015-12-19: qty 50

## 2015-12-19 MED ORDER — HEPARIN SODIUM (PORCINE) 1000 UNIT/ML DIALYSIS
1000.0000 [IU] | INTRAMUSCULAR | Status: DC | PRN
  Filled 2015-12-19: qty 1

## 2015-12-19 MED ORDER — VITAMIN B-1 100 MG PO TABS
100.0000 mg | ORAL_TABLET | Freq: Every day | ORAL | Status: DC
  Administered 2015-12-19 – 2015-12-22 (×4): 100 mg via ORAL
  Filled 2015-12-19 (×4): qty 1

## 2015-12-19 MED ORDER — ALTEPLASE 2 MG IJ SOLR: 2.0000 mg | Freq: Once | INTRAMUSCULAR | Status: DC | PRN

## 2015-12-19 MED ORDER — ONDANSETRON HCL 4 MG/2ML IJ SOLN
4.0000 mg | Freq: Four times a day (QID) | INTRAMUSCULAR | Status: DC | PRN
  Administered 2015-12-19 – 2015-12-22 (×2): 4 mg via INTRAVENOUS
  Filled 2015-12-19: qty 2

## 2015-12-19 MED ORDER — SEVELAMER CARBONATE 800 MG PO TABS
800.0000 mg | ORAL_TABLET | Freq: Three times a day (TID) | ORAL | Status: DC
  Administered 2015-12-19 – 2015-12-21 (×6): 800 mg via ORAL
  Filled 2015-12-19 (×6): qty 1

## 2015-12-19 MED ORDER — DEXTROSE 10 % IV SOLN
INTRAVENOUS | Status: DC
  Administered 2015-12-19: 02:00:00 via INTRAVENOUS

## 2015-12-19 MED ORDER — ALBUTEROL SULFATE (2.5 MG/3ML) 0.083% IN NEBU: 2.5000 mg | INHALATION_SOLUTION | RESPIRATORY_TRACT | Status: DC | PRN

## 2015-12-19 MED ORDER — ADULT MULTIVITAMIN W/MINERALS CH
1.0000 | ORAL_TABLET | Freq: Every day | ORAL | Status: DC
  Administered 2015-12-19 – 2015-12-22 (×4): 1 via ORAL
  Filled 2015-12-19 (×4): qty 1

## 2015-12-19 MED ORDER — THIAMINE HCL 100 MG/ML IJ SOLN: 100.0000 mg | Freq: Every day | INTRAMUSCULAR | Status: DC

## 2015-12-19 NOTE — Progress Notes (Signed)
Hemodialysis- Per Dr. Joelyn Oms treatment time 4 hours with UF goal 6L. Done.

## 2015-12-19 NOTE — Progress Notes (Signed)
PT CBG 90 at 1209

## 2015-12-19 NOTE — Progress Notes (Signed)
LCSW attempted to see patient at bedside. Patient resting and sleeping and would not wake up.  Patient grunted a few times, but would not arouse. RN reports consistent behavior since coming back from HD.  Consult placed for SW: homeless issues as patient recently has been evicted from apartment. Aware of current SA use as well per report in ED. No UDS has been completed this admission.   CIWA is 14 and has been getting Ativan   Will follow up with patient on Sunday for assessment of needs and consult.  Lane Hacker, MSW Clinical Social Work: [employer] [phone]

## 2015-12-19 NOTE — Progress Notes (Signed)
PROGRESS NOTE    Raymond [name]  [id number] DOB: January 11, 1956 DOA: 12/18/2015 PCP: No PCP Per Patient (Confirm with patient/family/NH records and if not entered, this HAS to be entered at [facility] point of entry. "No PCP" if truly none.)   Brief Narrative: (Start on day 1 of progress note - keep it brief and live) Decompensated ESRD complicated with hyperkalemia, hypoglycemia. 60 y.o male with ESRD, etho and cocaine abuse, non compliant with HD. Stable after HD, case manager for discharge planing.   Assessment & Plan:   Principal Problem:   End stage renal disease (HCC) Active Problems:   Hyperkalemia   Hypoglycemia   Polysubstance abuse   Homelessness   Anemia of chronic disease  1. Cardiovascular. Patient on HD, euvolemic, plan for about 3000 ml ultrafiltration today, will resume metoprolol for blood pressure control. Systolic blood pressure 150 to 160. EKG personally reviewed noted peak T waves on the precordial lead, normal intervals.  2. Pulmonary. Will continue to monitor oxymetry, supplemental 02 per [place] to target 02 sat above 92%, will keep negative fluid balance. Chest film personally reviewed noted ap film with good inspiration, good penetration, rotation to the left side with increase lung marking, but no frank infliltrates.  3. Nephrology. Patient tolerating HD well, will continue to follow nephrology recommendations. Will continue renvela, check renal panel in am.   4. Neurology. Substance abuse, will continue benzodiazepines as needed, will continue thiamine and folic acid, neuro checks per unit protocol.  5, Endocrinology. Will continue to monitor serum glucose, hypoglycemia probably due to decompensated renal failure. Patient tolerating po well. Serum glucose 57-110-58-105-41.  6. dvt px  DVT prophylaxis: (Lovenox/Heparin/SCD's/anticoagulated/None (if comfort care) Code Status: (Full/Partial - specify details) Family Communication: (Specify name, relationship & date  discussed. NO "discussed with patient") Disposition Plan: (specify when and where you expect patient to be discharged). Include barriers to DC in this tab.   Consultants:   nephrology  Procedures: (Don't include imaging studies which can be auto populated. Include things that cannot be auto populated i.e. Echo, Carotid and venous dopplers, Foley, Bipap, HD, tubes/drains, wound vac, central lines etc)    Antimicrobials: (specify start and planned stop date. Auto populated tables are space occupying and do not give end dates)     Subjective: Patient currently on hemodialysis, complains of dull, generalized, mild to moderate abdominal pain, no radiation, no improving or worsening factors. No chest pain, no dyspnea, no nausea or vomiting.  Objective: Filed Vitals:   12/19/15 0830 12/19/15 0900 12/19/15 0903 12/19/15 0908  BP: 143/89 153/97 150/88 145/90  Pulse: 87 80  82  Temp:      TempSrc:      Resp:      Height:      Weight:      SpO2: 96% 96%  96%    Intake/Output Summary (Last 24 hours) at 12/19/15 0915 Last data filed at 12/19/15 0400  Gross per 24 hour  Intake 544.16 ml  Output      0 ml  Net 544.16 ml   Filed Weights   12/18/15 2207 12/19/15 0656  Weight: 72.576 kg (160 lb) 77.9 kg (171 lb 11.8 oz)    Examination:  General exam: Deconditioned. E ENT: mild conjunctival pallor, with moist oral mucosa.  Respiratory system: Clear to auscultation. Respiratory effort normal. Mild decreased breath sounds at bases. Cardiovascular system: S1 & S2 heard, RRR. No JVD, murmurs, rubs, gallops or clicks. No pedal edema. Gastrointestinal system: Abdomen is mild distended with  hyper-reactive bowel sounds, soft and nontender. No organomegaly or masses felt. No peritoneal signs. Central nervous system: Alert and oriented. No focal neurological deficits. Extremities: Symmetric 5 x 5 power. Skin: No rashes, lesions or ulcers Psychiatry: Judgement and insight appear normal. Mood  & affect appropriate.     Data Reviewed: I have personally reviewed following labs and imaging studies  CBC:  Recent Labs Lab 12/18/15 2210 12/18/15 2218 12/19/15 0010 12/19/15 0559  WBC 3.5*  --   --  3.1*  NEUTROABS 1.3*  --   --   --   HGB 8.5* 10.2* 10.2* 8.4*  HCT 26.9* 30.0* 30.0* 27.3*  MCV 96.4  --   --  96.5  PLT 140*  --   --  129*   Basic Metabolic Panel:  Recent Labs Lab 12/18/15 2210 12/18/15 2218 12/19/15 0010 12/19/15 0559  NA 135 133* 135 137  K 6.7* 6.5* 5.2* 5.6*  CL 95* 99* 100* 93*  CO2 19*  --   --  22  GLUCOSE 82 80 48* 65  BUN 84* 79* 80* 85*  CREATININE 20.44* >18.00* >18.00* 20.49*  CALCIUM 8.7*  --   --  9.0  PHOS  --   --   --  11.4*   GFR: Estimated Creatinine Clearance: 3.5 mL/min (by C-G formula based on Cr of 20.49). Liver Function Tests:  Recent Labs Lab 12/18/15 2210 12/19/15 0559  AST 6*  --   ALT 8*  --   ALKPHOS 57  --   BILITOT 0.7  --   PROT 7.2  --   ALBUMIN 3.2* 2.7*   No results for input(s): LIPASE, AMYLASE in the last 168 hours. No results for input(s): AMMONIA in the last 168 hours. Coagulation Profile: No results for input(s): INR, PROTIME in the last 168 hours. Cardiac Enzymes: No results for input(s): CKTOTAL, CKMB, CKMBINDEX, TROPONINI in the last 168 hours. BNP (last 3 results) No results for input(s): PROBNP in the last 8760 hours. HbA1C: No results for input(s): HGBA1C in the last 72 hours. CBG:  Recent Labs Lab 12/19/15 0241 12/19/15 0330 12/19/15 0500 12/19/15 0547 12/19/15 0629  GLUCAP 57* 110* 58* 105* 41*   Lipid Profile: No results for input(s): CHOL, HDL, LDLCALC, TRIG, CHOLHDL, LDLDIRECT in the last 72 hours. Thyroid Function Tests: No results for input(s): TSH, T4TOTAL, FREET4, T3FREE, THYROIDAB in the last 72 hours. Anemia Panel: No results for input(s): VITAMINB12, FOLATE, FERRITIN, TIBC, IRON, RETICCTPCT in the last 72 hours. Sepsis Labs: No results for input(s):  PROCALCITON, LATICACIDVEN in the last 168 hours.  No results found for this or any previous visit (from the past 240 hour(s)).       Radiology Studies: Dg Chest Port 1 View  12/18/2015  CLINICAL DATA:  Weakness for 3 weeks, missed dialysis EXAM: PORTABLE CHEST 1 VIEW COMPARISON:  10/22/2013 FINDINGS: Borderline cardiomegaly. No acute infiltrate or pleural effusion. No pulmonary edema. Mild degenerative changes thoracic spine. IMPRESSION: No active disease. Electronically Signed   By: Natasha Mead M.D.   On: 12/18/2015 22:41        Scheduled Meds: . folic acid  1 mg Oral Daily  . heparin  5,000 Units Subcutaneous Q8H  . multivitamin with minerals  1 tablet Oral Daily  . ondansetron      . sevelamer carbonate  800 mg Oral TID WC  . sodium chloride flush  3 mL Intravenous Q12H  . sodium polystyrene  30 g Oral Once  . thiamine  100  mg Oral Daily   Or  . thiamine  100 mg Intravenous Daily  . vitamin B-12  1,000 mcg Oral Daily   Continuous Infusions: . dextrose 75 mL/hr at 12/19/15 1610            Ravan Schlemmer Annett Gula, MD [facility] Pager [phone]  If 7PM-7AM, please contact night-coverage [email or web address] Password [email or web address] 12/19/2015, 9:15 AM

## 2015-12-19 NOTE — Progress Notes (Signed)
Hemodialysis- 0900: BG 117. Pt has eaten, continue to monitor

## 2015-12-19 NOTE — Progress Notes (Signed)
CRITICAL VALUE ALERT  Critical value received: CBG 57 @0241 & 58 @0500   Date of notification:  12/19/2015  Time of notification:  0530  Critical value read back:Yes.    Nurse who received alert:  Fara Chute  MD notified (1st page):  NP Donnal Debar  Time of first page:  0530  MD notified (2nd page):  Time of second page:  Responding MD:  NP Donnal Debar  Time MD [id number] patient CBG 57 hypoglycemia protocol initiated. Patient given 1 Amp D50. CBG rechecked, 110. 1 hour later CBG rechecked CBG 57. Hypoglycemia protocol initiated again. 1 Amp D50 given. CBG rechecked, 105. NP lynch ordered rate for D10 to be increased from 100mL/hr to 71mL/hr. RN will continue to monitor.

## 2015-12-19 NOTE — Progress Notes (Signed)
Hemodialysis- Treatment complete. Total UF 4L. Pt symptomatic with small bp drop, complained of dizziness/hot flash. Relieved with saline however, unable to meet uf goal. CBG post treatment 121 prior to transport back to room. Pt currently has no complaints.

## 2015-12-19 NOTE — H&P (Signed)
History and Physical    Raymond [name] [id number] DOB: [birth year] DOA: 12/18/2015  Referring MD/NP/PA: Abigail Butts, PA-C PCP: No PCP Per Patient  Patient coming from: Home  Chief Complaint: Shortness of breath and weakness  HPI: Raymond [name] is a 60 y.o. male with medical history significant of  ESRD on HD M/W/F, HTN controlled, polysubstance abuse, , hepatitis C; who presents after missing his last 2 dialysis treatments with complaints of shortness of breath and weakness. Patient is  a poor historian and much of the history is obtained from review of records. Patient last received hemodialysis session was on 12/14/2015,  but notes that the dialysis session was cut short secondary to him having cramps. Patient was noted to tell nursing staff that he had recently done cocaine 1-2 days ago and had at least 3 shots of alcohol just yesterday. He also had recently been evicted from the place where he lived and is currently homeless. Upon asking the patient on why he missed hemodialysis he replies that "it's complicated". Patient drifts back off to sleep and does not really answer any other questions.  ED Course:  Upon admission to the emergency department patient was evaluated and seen to be afebrile, with pulse to 29, heart rates up to 190/117, and O2 saturations maintain on room air. Lab work revealed hemoglobin 8.5, RBC 3.5, platelets 140, potassium  6.7, chloride 99, BUNs 84, creatinine 20.44, calcium 8.7, and glucose 82. Chest x-ray showed borderline cardiomegaly. Patient's hyperkalemia was initially treated with 2 g calcium gluconate, sodium bicarbonate, 5 units of insulin,  D50, and Kayexalate 15 g. Patient subsequently developed hypoglycemia with blood sugars as low as 48. Patient received a total of 3 amps of D50 and then  placed on a D10 drip. Dr. Ruta Hinds of Nephrology was consulted and wrote orders for Dialysis in a.m.  Review of Systems: As per HPI otherwise 10 point review of  systems negative.   Past Medical History  Diagnosis Date  . Chronic kidney disease   . Hypertension   . Anemia   . Thyroid disease   . Hepatitis C     Still positive s/p liver biopsy at [facility]  and interferon therapy for 6 months. Most recent lab work was on 10/24/12  . Substance abuse     Past Surgical History  Procedure Laterality Date  . Liver biopsy    . Av fistula placement Left Aug. 2013 ?  [name] Shuntogram N/A 12/11/2012    Procedure: Earney Mallet;  Surgeon: Serafina Mitchell, MD;  Location: [facility] CATH LAB;  Service: Cardiovascular;  Laterality: N/A;     reports that he quit smoking about 4 years ago. He has never used smokeless tobacco. He reports that he drinks alcohol. He reports that he uses illicit drugs (Cocaine and Marijuana).  No Known Allergies  Family History  Problem Relation Age of Onset  . Diabetes Father   . Hypertension Father     Prior to Admission medications   Medication Sig Start Date End Date Taking? Authorizing Provider  metoprolol succinate (TOPROL-XL) 25 MG 24 hr tablet Take 25 mg by mouth daily.    Historical Provider, MD  sevelamer (RENAGEL) 800 MG tablet Take 800 mg by mouth 3 (three) times daily with meals.    Historical Provider, MD  thiamine (VITAMIN B-1) 100 MG tablet Take 100 mg by mouth daily.    Historical Provider, MD  vitamin B-12 (CYANOCOBALAMIN) 1000 MCG tablet Take 1,000 mcg by mouth daily.  Historical Provider, MD    Physical Exam: Filed Vitals:   12/19/15 0115 12/19/15 0130 12/19/15 0145 12/19/15 0200  BP: 176/103 190/117 180/122 176/121  Pulse: 82 86 83 79  Temp:      TempSrc:      Resp: 14 14 18 20   Height:      Weight:      SpO2: 98% 98% 98% 99%      Constitutional:Patient is drowsy, but will respond to verbal commands Filed Vitals:   12/19/15 0115 12/19/15 0130 12/19/15 0145 12/19/15 0200  BP: 176/103 190/117 180/122 176/121  Pulse: 82 86 83 79  Temp:      TempSrc:      Resp: 14 14 18 20   Height:      Weight:       SpO2: 98% 98% 98% 99%   Eyes: PERRL, lids and conjunctivae normal ENMT: Mucous membranes are moist. Posterior pharynx clear of any exudate or lesions. Neck: normal, supple, no masses, no thyromegaly Respiratory: clear to auscultation bilaterally, no wheezing, no crackles. Normal respiratory effort. No accessory muscle use.  Cardiovascular: Regular rate and rhythm, no murmurs / rubs / gallops. No extremity edema. 2+ pedal pulses. No carotid bruits.  Abdomen: no tenderness, no masses palpated. No hepatosplenomegaly. Bowel sounds positive.  Musculoskeletal: no clubbing / cyanosis. No joint deformity upper and lower extremities. Good ROM, no contractures. Normal muscle tone.  Skin: no rashes, lesions, ulcers. No induration Neurologic: CN 2-12 grossly intact. Sensation intact, DTR normal. Strength 5/5 in all 4.  Psychiatric: Poor judgment and insight patient lethargic, but with normal speech.    Labs on Admission: I have personally reviewed following labs and imaging studies  CBC:  Recent Labs Lab 12/18/15 2210 12/18/15 2218 12/19/15 0010  WBC 3.5*  --   --   NEUTROABS 1.3*  --   --   HGB 8.5* 10.2* 10.2*  HCT 26.9* 30.0* 30.0*  MCV 96.4  --   --   PLT 140*  --   --    Basic Metabolic Panel:  Recent Labs Lab 12/18/15 2210 12/18/15 2218 12/19/15 0010  NA 135 133* 135  K 6.7* 6.5* 5.2*  CL 95* 99* 100*  CO2 19*  --   --   GLUCOSE 82 80 48*  BUN 84* 79* 80*  CREATININE 20.44* >18.00* >18.00*  CALCIUM 8.7*  --   --    GFR: CrCl cannot be calculated (Patient has no serum creatinine result on file.). Liver Function Tests:  Recent Labs Lab 12/18/15 2210  AST 6*  ALT 8*  ALKPHOS 57  BILITOT 0.7  PROT 7.2  ALBUMIN 3.2*   No results for input(s): LIPASE, AMYLASE in the last 168 hours. No results for input(s): AMMONIA in the last 168 hours. Coagulation Profile: No results for input(s): INR, PROTIME in the last 168 hours. Cardiac Enzymes: No results for input(s):  CKTOTAL, CKMB, CKMBINDEX, TROPONINI in the last 168 hours. BNP (last 3 results) No results for input(s): PROBNP in the last 8760 hours. HbA1C: No results for input(s): HGBA1C in the last 72 hours. CBG:  Recent Labs Lab 12/18/15 2350 12/19/15 0110 12/19/15 0133 12/19/15 0159 12/19/15 0241  GLUCAP 65 49* 125* 78 57*   Lipid Profile: No results for input(s): CHOL, HDL, LDLCALC, TRIG, CHOLHDL, LDLDIRECT in the last 72 hours. Thyroid Function Tests: No results for input(s): TSH, T4TOTAL, FREET4, T3FREE, THYROIDAB in the last 72 hours. Anemia Panel: No results for input(s): VITAMINB12, FOLATE, FERRITIN, TIBC, IRON, RETICCTPCT in  the last 72 hours. Urine analysis:    Component Value Date/Time   COLORURINE YELLOW 05/08/2010 [place] 05/08/2010 1335   LABSPEC 1.009 05/08/2010 1335   PHURINE 6.0 05/08/2010 1335   GLUCOSEU NEGATIVE 05/08/2010 1335   HGBUR NEGATIVE 05/08/2010 1335   BILIRUBINUR NEGATIVE 05/08/2010 1335   KETONESUR NEGATIVE 05/08/2010 1335   PROTEINUR 100* 05/08/2010 1335   UROBILINOGEN 0.2 05/08/2010 1335   NITRITE NEGATIVE 05/08/2010 1335   LEUKOCYTESUR SMALL* 05/08/2010 1335   Sepsis Labs: No results found for this or any previous visit (from the past 240 hour(s)).   Radiological Exams on Admission: Dg Chest Port 1 View  12/18/2015  CLINICAL DATA:  Weakness for 3 weeks, missed dialysis EXAM: PORTABLE CHEST 1 VIEW COMPARISON:  10/22/2013 FINDINGS: Borderline cardiomegaly. No acute infiltrate or pleural effusion. No pulmonary edema. Mild degenerative changes thoracic spine. IMPRESSION: No active disease. Electronically Signed   By: Lahoma Crocker M.D.   On: 12/18/2015 22:41    EKG: Independently reviewed.Showed sinus rhythm with PR prolongation and early peaking T waves  Assessment/Plan End-stage renal disease on hemodialysis: Patient recently missed 2 of his last hemodialysis appointments. Patient does not discuss anything besides saying that is  complicated. - Admit to stepdown - Continue Renvela   - Dialysis scheduled in a.m. per nephrology  Hyperkalemia: Patient's initial potassium was noted to be 16.2 with EKG changes including PR prolongation and peaking T waves. Recheck after initial intervention potassium of 5.2. - Recheck renal function panel in a.m.  - Treatment as needed.  Essential hypertension - Held metoprolol due to the new history of cocaine use,  although given in ED prior to this information - Hydralazine IV prn   Hypoglycemia: PatHypoglycemic after treatment for hyperkalemia. - Hypoglycemic protocols - Continue D10 drip until blood glucose is improved  Polysubstance abuse: Patient reports recently using cocaine and alcohol. Patient's poor historian so we will place - CWIAA protocols - check serum drug screen   Anemia of chronic kidney disease: Hemoglobin 10.2 admission - Recheck CBC in a.m.   Homelessness/noncompliance with dialysis - Social work consult  DVT prophylaxis: heparin  Code Status: Full Family Communication: None  Disposition Plan: Undetermined Consults called: Nephrology Admission status: Observation stepdown  Norval Morton MD [facility] Pager [phone]  If 7PM-7AM, please contact night-coverage [email or web address] Password [email or web address]  12/19/2015, 3:08 AM

## 2015-12-19 NOTE — Procedures (Signed)
I was present at this dialysis session. I have reviewed the session itself and made appropriate changes.   2K bath. 6L UF.  Says having trouble with housing / transport, CM to assist.  Next HD on Monday.   Filed Weights   12/18/15 2207 12/19/15 0656  Weight: 72.576 kg (160 lb) 77.9 kg (171 lb 11.8 oz)     Recent Labs Lab 12/19/15 0559  NA 137  K 5.6*  CL 93*  CO2 22  GLUCOSE 65  BUN 85*  CREATININE 20.49*  CALCIUM 9.0  PHOS 11.4*     Recent Labs Lab 12/18/15 2210 12/18/15 2218 12/19/15 0010 12/19/15 0559  WBC 3.5*  --   --  3.1*  NEUTROABS 1.3*  --   --   --   HGB 8.5* 10.2* 10.2* 8.4*  HCT 26.9* 30.0* 30.0* 27.3*  MCV 96.4  --   --  96.5  PLT 140*  --   --  129*    Scheduled Meds: . folic acid  1 mg Oral Daily  . heparin  5,000 Units Subcutaneous Q8H  . multivitamin with minerals  1 tablet Oral Daily  . ondansetron      . sevelamer carbonate  800 mg Oral TID WC  . sodium chloride flush  3 mL Intravenous Q12H  . sodium polystyrene  30 g Oral Once  . thiamine  100 mg Oral Daily   Or  . thiamine  100 mg Intravenous Daily  . vitamin B-12  1,000 mcg Oral Daily   Continuous Infusions: . dextrose 75 mL/hr at 12/19/15 0549   PRN Meds:.sodium chloride, sodium chloride, acetaminophen **OR** acetaminophen, albuterol, alteplase, heparin, heparin, hydrALAZINE, lidocaine (PF), lidocaine-prilocaine, LORazepam **OR** LORazepam, ondansetron **OR** ondansetron (ZOFRAN) IV, pentafluoroprop-tetrafluoroeth   Pearson Grippe  MD 12/19/2015, 7:35 AM

## 2015-12-19 NOTE — Progress Notes (Signed)
Hemodialysis- Pt arrived to unit in no distress. BG checked prior to initiation of HD =131. Pt is 7.9kg above EDW. MD paged. Treatment started at 0656. Pt currently watching tv. Continue to monitor.

## 2015-12-19 NOTE — Progress Notes (Signed)
CRITICAL VALUE ALERT  Critical value received:  CBG 41   Date of notification:  12/19/2015  Time of notification:  0635  Critical value read back:Yes.    Nurse who received alert:  Fara Chute  MD notified (1st page):  -  Time of first page:  -  MD notified (2nd page):  Time of second page:  Responding MD:  -  Time MD responded:  -  RN gave 1 Amp D50. Patient transferred to Hemodialysis, Hemo RN notified to recheck CBG in 15 minutes.

## 2015-12-20 DIAGNOSIS — E875 Hyperkalemia: Secondary | ICD-10-CM | POA: Diagnosis not present

## 2015-12-20 DIAGNOSIS — I150 Renovascular hypertension: Secondary | ICD-10-CM | POA: Diagnosis present

## 2015-12-20 DIAGNOSIS — I120 Hypertensive chronic kidney disease with stage 5 chronic kidney disease or end stage renal disease: Secondary | ICD-10-CM | POA: Diagnosis not present

## 2015-12-20 DIAGNOSIS — E872 Acidosis: Secondary | ICD-10-CM | POA: Diagnosis present

## 2015-12-20 DIAGNOSIS — E162 Hypoglycemia, unspecified: Secondary | ICD-10-CM | POA: Diagnosis present

## 2015-12-20 DIAGNOSIS — Z87891 Personal history of nicotine dependence: Secondary | ICD-10-CM | POA: Diagnosis not present

## 2015-12-20 DIAGNOSIS — Z833 Family history of diabetes mellitus: Secondary | ICD-10-CM | POA: Diagnosis not present

## 2015-12-20 DIAGNOSIS — D631 Anemia in chronic kidney disease: Secondary | ICD-10-CM | POA: Diagnosis not present

## 2015-12-20 DIAGNOSIS — Z9119 Patient's noncompliance with other medical treatment and regimen: Secondary | ICD-10-CM | POA: Diagnosis not present

## 2015-12-20 DIAGNOSIS — Z8249 Family history of ischemic heart disease and other diseases of the circulatory system: Secondary | ICD-10-CM | POA: Diagnosis not present

## 2015-12-20 DIAGNOSIS — N186 End stage renal disease: Secondary | ICD-10-CM

## 2015-12-20 DIAGNOSIS — Z992 Dependence on renal dialysis: Secondary | ICD-10-CM | POA: Diagnosis not present

## 2015-12-20 DIAGNOSIS — N2581 Secondary hyperparathyroidism of renal origin: Secondary | ICD-10-CM | POA: Diagnosis present

## 2015-12-20 DIAGNOSIS — Z79899 Other long term (current) drug therapy: Secondary | ICD-10-CM | POA: Diagnosis not present

## 2015-12-20 DIAGNOSIS — Z590 Homelessness: Secondary | ICD-10-CM | POA: Diagnosis not present

## 2015-12-20 DIAGNOSIS — B1920 Unspecified viral hepatitis C without hepatic coma: Secondary | ICD-10-CM | POA: Diagnosis present

## 2015-12-20 LAB — GLUCOSE, CAPILLARY
GLUCOSE-CAPILLARY: 93 mg/dL (ref 65–99)
GLUCOSE-CAPILLARY: 73 mg/dL (ref 65–99)
GLUCOSE-CAPILLARY: 103 mg/dL — AB (ref 65–99)
GLUCOSE-CAPILLARY: 91 mg/dL (ref 65–99)
Glucose-Capillary: 106 mg/dL — ABNORMAL HIGH (ref 65–99)
Glucose-Capillary: 131 mg/dL — ABNORMAL HIGH (ref 65–99)
Glucose-Capillary: 98 mg/dL (ref 65–99)

## 2015-12-20 MED ORDER — AMLODIPINE BESYLATE 5 MG PO TABS
5.0000 mg | ORAL_TABLET | Freq: Every day | ORAL | Status: DC
  Administered 2015-12-20 – 2015-12-22 (×3): 5 mg via ORAL
  Filled 2015-12-20 (×3): qty 1

## 2015-12-20 MED ORDER — HEPARIN SODIUM (PORCINE) 1000 UNIT/ML DIALYSIS
20.0000 [IU]/kg | INTRAMUSCULAR | Status: DC | PRN
  Filled 2015-12-20: qty 2

## 2015-12-20 NOTE — Progress Notes (Signed)
PROGRESS NOTE    Raymond [name]  VHQ:[id number] DOB: 1955/11/24 DOA: 12/18/2015 PCP: No PCP Per Patient (Confirm with patient/family/NH records and if not entered, this HAS to be entered at [facility] point of entry. "No PCP" if truly none.)   Brief Narrative: (Start on day 1 of progress note - keep it brief and live) Decompensated ESRD complicated with hyperkalemia, hypoglycemia. 59 y.o male with ESRD, etho and cocaine abuse, non compliant with HD. Stable after HD, case manager for discharge planing.   Assessment & Plan:   Principal Problem:   End stage renal disease (HCC) Active Problems:   Hyperkalemia   Hypoglycemia   Polysubstance abuse   Homelessness   Anemia of chronic disease  1. Cardiovascular. Continue blood pressure control, systolic 150 to 180, will start on amlodipine 5 mg day. Patient at home on metorpolol xl.  2. Pulmonary. Patient euvolemic, no signs of volume overload, will continue to monitor oxymetry.   3. Nephrology. Hemodialysis per nephrology recommendations  4. Neurology. Substance abuse, will continue benzodiazepines as needed, will continue thiamine and folic acid, neuro checks per unit protocol. No signs of active withdrawal  5, Endocrinology. Serum glucose at 106-93-73-103-91, will continue to glucose monitor, patient tolerating po well.   6. dvt px  Case manager consult for discharge planing. Will transfer patient to medical unit, no need for remote telemetry.   DVT prophylaxis: (Lovenox/Heparin/SCD's/anticoagulated/None (if comfort care) Code Status: (Full/Partial - specify details) Family Communication: (Specify name, relationship & date discussed. NO "discussed with patient") Disposition Plan: (specify when and where you expect patient to be discharged). Include barriers to DC in this tab.   Consultants:   nephrology  Procedures: (Don't include imaging studies which can be auto populated. Include things that cannot be auto populated i.e. Echo,  Carotid and venous dopplers, Foley, Bipap, HD, tubes/drains, wound vac, central lines etc)    Antimicrobials: (specify start and planned stop date. Auto populated tables are space occupying and do not give end dates)     Subjective: Patient feeling well, mild abdominal distention, but no abdominal pain, no nausea or vomiting, tolerating po diet well, no sob or chest pain.  Objective: Filed Vitals:   12/20/15 0340 12/20/15 0750 12/20/15 0753 12/20/15 1157  BP: 177/99  166/97 182/102  Pulse: 97  72   Temp: 98.1 F (36.7 C) 98.4 F (36.9 C)    TempSrc: Oral Oral    Resp: 17  22   Height:      Weight: 75.9 kg (167 lb 5.3 oz)     SpO2: 100%  99%     Intake/Output Summary (Last 24 hours) at 12/20/15 1258 Last data filed at 12/20/15 1000  Gross per 24 hour  Intake 2424.58 ml  Output      0 ml  Net 2424.58 ml   Filed Weights   12/19/15 0656 12/19/15 1105 12/20/15 0340  Weight: 77.9 kg (171 lb 11.8 oz) 73.6 kg (162 lb 4.1 oz) 75.9 kg (167 lb 5.3 oz)    Examination:  General exam: Appears calm and comfortable No in pain or dyspnea. Respiratory system: Clear to auscultation. Respiratory effort normal. Bilateral vesicular breath sounds. Cardiovascular system: S1 & S2 heard, RRR. No JVD, murmurs, rubs, gallops or clicks. No pedal edema. Gastrointestinal system: Abdomen is nondistended, soft and nontender. No organomegaly or masses felt. Normal bowel sounds heard. Central nervous system: Alert and oriented. No focal neurological deficits. Extremities: Symmetric 5 x 5 power. Skin: No rashes, lesions or ulcers Psychiatry: Judgement  and insight appear normal. Mood & affect appropriate.     Data Reviewed: I have personally reviewed following labs and imaging studies  CBC:  Recent Labs Lab 12/18/15 2210 12/18/15 2218 12/19/15 0010 12/19/15 0559  WBC 3.5*  --   --  3.1*  NEUTROABS 1.3*  --   --   --   HGB 8.5* 10.2* 10.2* 8.4*  HCT 26.9* 30.0* 30.0* 27.3*  MCV 96.4  --    --  96.5  PLT 140*  --   --  129*   Basic Metabolic Panel:  Recent Labs Lab 12/18/15 2210 12/18/15 2218 12/19/15 0010 12/19/15 0559  NA 135 133* 135 137  K 6.7* 6.5* 5.2* 5.6*  CL 95* 99* 100* 93*  CO2 19*  --   --  22  GLUCOSE 82 80 48* 65  BUN 84* 79* 80* 85*  CREATININE 20.44* >18.00* >18.00* 20.49*  CALCIUM 8.7*  --   --  9.0  PHOS  --   --   --  11.4*   GFR: Estimated Creatinine Clearance: 3.5 mL/min (by C-G formula based on Cr of 20.49). Liver Function Tests:  Recent Labs Lab 12/18/15 2210 12/19/15 0559  AST 6*  --   ALT 8*  --   ALKPHOS 57  --   BILITOT 0.7  --   PROT 7.2  --   ALBUMIN 3.2* 2.7*   No results for input(s): LIPASE, AMYLASE in the last 168 hours. No results for input(s): AMMONIA in the last 168 hours. Coagulation Profile: No results for input(s): INR, PROTIME in the last 168 hours. Cardiac Enzymes: No results for input(s): CKTOTAL, CKMB, CKMBINDEX, TROPONINI in the last 168 hours. BNP (last 3 results) No results for input(s): PROBNP in the last 8760 hours. HbA1C: No results for input(s): HGBA1C in the last 72 hours. CBG:  Recent Labs Lab 12/19/15 2002 12/20/15 0037 12/20/15 0339 12/20/15 0751 12/20/15 1150  GLUCAP 117* 106* 93 73 103*   Lipid Profile: No results for input(s): CHOL, HDL, LDLCALC, TRIG, CHOLHDL, LDLDIRECT in the last 72 hours. Thyroid Function Tests: No results for input(s): TSH, T4TOTAL, FREET4, T3FREE, THYROIDAB in the last 72 hours. Anemia Panel: No results for input(s): VITAMINB12, FOLATE, FERRITIN, TIBC, IRON, RETICCTPCT in the last 72 hours. Sepsis Labs: No results for input(s): PROCALCITON, LATICACIDVEN in the last 168 hours.  Recent Results (from the past 240 hour(s))  MRSA PCR Screening     Status: None   Collection Time: 12/19/15  2:56 AM  Result Value Ref Range Status   MRSA by PCR NEGATIVE NEGATIVE Final    Comment:        The GeneXpert MRSA Assay (FDA approved for NASAL specimens only), is one  component of a comprehensive MRSA colonization surveillance program. It is not intended to diagnose MRSA infection nor to guide or monitor treatment for MRSA infections. Performed at [facility]          Radiology Studies: Dg Chest Port 1 View  12/18/2015  CLINICAL DATA:  Weakness for 3 weeks, missed dialysis EXAM: PORTABLE CHEST 1 VIEW COMPARISON:  10/22/2013 FINDINGS: Borderline cardiomegaly. No acute infiltrate or pleural effusion. No pulmonary edema. Mild degenerative changes thoracic spine. IMPRESSION: No active disease. Electronically Signed   By: Natasha Mead M.D.   On: 12/18/2015 22:41        Scheduled Meds: . folic acid  1 mg Oral Daily  . heparin  5,000 Units Subcutaneous Q8H  . multivitamin with minerals  1  tablet Oral Daily  . sevelamer carbonate  800 mg Oral TID WC  . sodium chloride flush  3 mL Intravenous Q12H  . sodium polystyrene  30 g Oral Once  . thiamine  100 mg Oral Daily   Or  . thiamine  100 mg Intravenous Daily  . vitamin B-12  1,000 mcg Oral Daily   Continuous Infusions: . dextrose 50 mL/hr at 12/20/15 1000            Amrit Cress Annett Gula, MD [facility] Pager [phone]  If 7PM-7AM, please contact night-coverage [email or web address] Password [facility] 12/20/2015, 12:58 PM

## 2015-12-20 NOTE — Progress Notes (Signed)
Admit: 12/18/2015 LOS:   82M ESRD with homelessness; missed HD x2, admit with hyperkalemia  Subjective:  HD yesterday AM, 4L.  Post weight 73.6kg bed?  06/10 0701 - 06/11 0700 In: 1934.6 [P.O.:540; I.V.:1394.6] Out: 3913   Filed Weights   12/19/15 0656 12/19/15 1105 12/20/15 0340  Weight: 77.9 kg (171 lb 11.8 oz) 73.6 kg (162 lb 4.1 oz) 75.9 kg (167 lb 5.3 oz)    Scheduled Meds: . folic acid  1 mg Oral Daily  . heparin  5,000 Units Subcutaneous Q8H  . multivitamin with minerals  1 tablet Oral Daily  . sevelamer carbonate  800 mg Oral TID WC  . sodium chloride flush  3 mL Intravenous Q12H  . sodium polystyrene  30 g Oral Once  . thiamine  100 mg Oral Daily   Or  . thiamine  100 mg Intravenous Daily  . vitamin B-12  1,000 mcg Oral Daily   Continuous Infusions: . dextrose 50 mL/hr at 12/20/15 0500   PRN Meds:.acetaminophen **OR** acetaminophen, albuterol, hydrALAZINE, LORazepam **OR** LORazepam, ondansetron **OR** ondansetron (ZOFRAN) IV  Current Labs: reviewed    Physical Exam:  Blood pressure 177/99, pulse 97, temperature 98.1 F (36.7 C), temperature source Oral, resp. rate 17, height 5\' 7"  (1.702 m), weight 75.9 kg (167 lb 5.3 oz), SpO2 100 %. GEN: NAD ENT: NCAT EYES: unable to examine CV: RRR, no rub, nl s1s2 PULM: CTAB ABD: s/nt/n SKIN: no rashes/lesions EXT:no LEE LFA AVF+B/T   Outpt HD Orders Unit: [facility] Days: MWF Time: 4h Dialyzer: F180 EDW: 70kg K/Ca: 2/2 Access: LFA AVF Needle Size: 15g BFR/DFR: 450/800 UF Proflie: 4 VDRA: calcitriol 1.8mcg qTx EPO: Mircera 225 q2wk, was due 6/7 IV Fe: none Heparin: 2000 IVB Treatment Adherence: poor  A A 1. ESRD MWF via AVF at [facility] 2. Hyperkalemia, mild; EKG with some peaked Ts but also consistent with chronic chagnes  3. Nonadherence to Tx: 4. HTN/Vol: nears EDW as outpt 5. Anemia: missed ESA 6/7 6. Homelessness: SW trying to help  P 1. HD on schedule tomorrow, 5L UF goal, 2/2 bath. Tight heparin.  AVF  Pearson Grippe MD 12/20/2015, 8:45 AM   Recent Labs Lab 12/18/15 2210 12/18/15 2218 12/19/15 0010 12/19/15 0559  NA 135 133* 135 137  K 6.7* 6.5* 5.2* 5.6*  CL 95* 99* 100* 93*  CO2 19*  --   --  22  GLUCOSE 82 80 48* 65  BUN 84* 79* 80* 85*  CREATININE 20.44* >18.00* >18.00* 20.49*  CALCIUM 8.7*  --   --  9.0  PHOS  --   --   --  11.4*    Recent Labs Lab 12/18/15 2210 12/18/15 2218 12/19/15 0010 12/19/15 0559  WBC 3.5*  --   --  3.1*  NEUTROABS 1.3*  --   --   --   HGB 8.5* 10.2* 10.2* 8.4*  HCT 26.9* 30.0* 30.0* 27.3*  MCV 96.4  --   --  96.5  PLT 140*  --   --  129*

## 2015-12-21 LAB — GLUCOSE, CAPILLARY
GLUCOSE-CAPILLARY: 131 mg/dL — AB (ref 65–99)
GLUCOSE-CAPILLARY: 82 mg/dL (ref 65–99)
GLUCOSE-CAPILLARY: 83 mg/dL (ref 65–99)
Glucose-Capillary: 101 mg/dL — ABNORMAL HIGH (ref 65–99)

## 2015-12-21 LAB — BASIC METABOLIC PANEL
ANION GAP: 16 — AB (ref 5–15)
BUN: 46 mg/dL — ABNORMAL HIGH (ref 6–20)
CALCIUM: 9.3 mg/dL (ref 8.9–10.3)
CHLORIDE: 91 mmol/L — AB (ref 101–111)
CO2: 24 mmol/L (ref 22–32)
CREATININE: 12.41 mg/dL — AB (ref 0.61–1.24)
GFR calc Af Amer: 4 mL/min — ABNORMAL LOW (ref >60)
GFR calc non Af Amer: 4 mL/min — ABNORMAL LOW (ref >60)
Glucose, Bld: 86 mg/dL (ref 65–99)
Potassium: 5.3 mmol/L — ABNORMAL HIGH (ref 3.5–5.1)
Sodium: 131 mmol/L — ABNORMAL LOW (ref 135–145)

## 2015-12-21 LAB — CBC
HEMATOCRIT: 28.5 % — AB (ref 39.0–52.0)
Hemoglobin: 9 g/dL — ABNORMAL LOW (ref 13.0–17.0)
MCH: 30.2 pg (ref 26.0–34.0)
MCHC: 31.6 g/dL (ref 30.0–36.0)
MCV: 95.6 fL (ref 78.0–100.0)
Platelets: 123 10*3/uL — ABNORMAL LOW (ref 150–400)
RBC: 2.98 MIL/uL — ABNORMAL LOW (ref 4.22–5.81)
RDW: 17.4 % — AB (ref 11.5–15.5)
WBC: 2.8 10*3/uL — ABNORMAL LOW (ref 4.0–10.5)

## 2015-12-21 MED ORDER — METOPROLOL SUCCINATE ER 25 MG PO TB24: 50.0000 mg | ORAL_TABLET | Freq: Every day | ORAL | Status: DC

## 2015-12-21 MED ORDER — DARBEPOETIN ALFA 200 MCG/0.4ML IJ SOSY: 200.0000 ug | PREFILLED_SYRINGE | INTRAMUSCULAR | Status: DC

## 2015-12-21 MED ORDER — METOPROLOL TARTRATE 50 MG PO TABS
50.0000 mg | ORAL_TABLET | Freq: Two times a day (BID) | ORAL | Status: DC
  Administered 2015-12-21 – 2015-12-22 (×3): 50 mg via ORAL
  Filled 2015-12-21 (×3): qty 1

## 2015-12-21 MED ORDER — DARBEPOETIN ALFA 200 MCG/0.4ML IJ SOSY
PREFILLED_SYRINGE | INTRAMUSCULAR | Status: AC
  Administered 2015-12-21: 200 ug
  Filled 2015-12-21: qty 0.4

## 2015-12-21 MED ORDER — HYDROXYZINE HCL 25 MG PO TABS
ORAL_TABLET | ORAL | Status: AC
  Administered 2015-12-21: 25 mg
  Filled 2015-12-21: qty 1

## 2015-12-21 MED ORDER — SEVELAMER CARBONATE 800 MG PO TABS
4000.0000 mg | ORAL_TABLET | Freq: Three times a day (TID) | ORAL | Status: DC
  Administered 2015-12-21 – 2015-12-22 (×4): 4000 mg via ORAL
  Filled 2015-12-21 (×5): qty 5

## 2015-12-21 MED ORDER — HYDROXYZINE HCL 25 MG PO TABS: 25.0000 mg | ORAL_TABLET | Freq: Once | ORAL | Status: AC

## 2015-12-21 MED ORDER — CINACALCET HCL 30 MG PO TABS
30.0000 mg | ORAL_TABLET | Freq: Every day | ORAL | Status: DC
  Administered 2015-12-21 – 2015-12-22 (×2): 30 mg via ORAL
  Filled 2015-12-21 (×2): qty 1

## 2015-12-21 MED ORDER — CINACALCET HCL 30 MG PO TABS: 30.0000 mg | ORAL_TABLET | Freq: Every day | ORAL | Status: DC

## 2015-12-21 MED ORDER — ACETAMINOPHEN 325 MG PO TABS
ORAL_TABLET | ORAL | Status: AC
  Administered 2015-12-21: 650 mg
  Filled 2015-12-21: qty 2

## 2015-12-21 NOTE — Clinical Social Work Note (Addendum)
CSW attempted to see patient to provide information on homeless shelters, scat transportation, substance abuse treatment resources, and senior resources of [facility] for transportation assistance, patient not currently in his room, due to being in hemodialysis.  CSW will attempt at a later time.  5:30pm  CSW met with patient and provided resources for homeless shelters, scat transportation, substance abuse treatment resources, and senior resources of [facility], for transportation assistance.  Jones Broom. [place], MSW, [place] 12/21/2015 4:55 PM

## 2015-12-21 NOTE — Progress Notes (Signed)
Stool had a normal look to it.Solid

## 2015-12-21 NOTE — Progress Notes (Signed)
Notified by MD that pt is being discharged today.  He is homeless and on dialysis.  He will need assistance with transportation to dialysis and shelter information. Referral to CSW to follow up prior to dc.    Reinaldo Raddle, RN, BSN  Trauma/Neuro ICU Case Manager [phone]

## 2015-12-21 NOTE — Discharge Instructions (Signed)
Please follow-up with primary care physician within one week, continue hemodialysis treatments.
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
## 2015-12-21 NOTE — Discharge Summary (Addendum)
Raymond [name], is a 60 y.o. male  DOB [birth year]  MRN [id number].  Admission date:  12/18/2015  Admitting Physician  Norval Morton, MD  Discharge Date:  12/21/2015   Primary MD  No PCP Per Patient  Recommendations for primary care physician for things to follow:  Patient is discharged home, instructed to follow with his primary care provider and hemodialysis unit as scheduled. Compliance has been reinforced.    Admission Diagnosis  End stage renal disease ([place]) [N18.6] Hyperkalemia [E87.5] SOB (shortness of breath) [R06.02] Renovascular hypertension [I15.0]   Discharge Diagnosis  End stage renal disease ([place]) [N18.6] Hyperkalemia [E87.5] SOB (shortness of breath) [R06.02] Renovascular hypertension [I15.0]    Principal Problem:   End stage renal disease (H[facility]) Active Problems:   Hyperkalemia   Hypoglycemia   Polysubstance abuse   Homelessness   Anemia of chronic disease      Past Medical History  Diagnosis Date  . Chronic kidney disease   . Hypertension   . Anemia   . Thyroid disease   . Hepatitis C     Still positive s/p liver biopsy at [facility]  and interferon therapy for 6 months. Most recent lab work was on 10/24/12  . Substance abuse     Past Surgical History  Procedure Laterality Date  . Liver biopsy    . Av fistula placement Left Aug. 2013 ?  [name] Shuntogram N/A 12/11/2012    Procedure: Earney Mallet;  Surgeon: Serafina Mitchell, MD;  Location: [facility] CATH LAB;  Service: Cardiovascular;  Laterality: N/A;       HPI  from the history and physical done on the day of admission:    This is a 60 year old gentleman who was admitted to the hospital due to shortness of breath and weakness. Patient had missed 2 dialysis treatments and as a consequence he developed shortness of breath and weakness. Apparently his last hemodialysis session was on June 5 and at that the treatment was interrupted early  to due to patient complaining of cramps. On his initial physical examination his systolic blood pressure was 176-190, respiratory rate was 14, heart rate 82, saturation 99%, he was noted to be lethargic, his mucous membranes were moist, neck was supple, his lungs had no wheezing, rails or rhonchi, his heart S1-S2 present rhythmic, he had no significant edema in his lower extremities, his abdomen was soft nontender. His serum sodium was 135, potassium was 6.7, his creatinine was 20.4 with a BUN of 84, his his serum bicarbonate was 19 with an anion gap of 21, his white cell count was 3.5 with a hemoglobin of 8.5. His portable chest film had borderline cardiomegaly with no significant acute infiltrates, his EKG had peaked waves in the inferior lateral leads.  Patient was admitted to hospital working diagnosis of shortness of breath and weakness due to decompensated end-stage renal disease, complicated by hyperkalemia and anion gap metabolic acidosis.    Hospital Course:   1. Cardiovascular. She remained hemodynamically stable, patient has been started on amlodipine 5  g daily for blood pressure control.  2. Pulmonary. Patient dyspnea improved remarkably after dialysis treatments with ultrafiltration. Patient had oximetry monitor and supplemental oxygen while he was in hospital.  3. Nephrology. Decompensated end-stage renal disease, patient was dialyzed and electrolytes were corrected. His symptoms improved with dialysis. It was reinforced about patient being compliance on dialysis sessions. I have discussed with patient in detail about complians with his hemodialysis treatments. He understands and he is fully aware that without dialysis he has serious risk of medical complications including death. He assures he will be compliant with his dialysis treatments.  4. Neurology. Alcohol abuse, patient was placed on lorazepam as needed, as well as folic acid and thiamine. Patient did not showing signs of  withdrawal symptoms.   5. Social services were consulted in order to arrange for safe discharge.   Discharge Condition: Stable  Follow UP     Consults obtained - nephrology  Diet and Activity recommendation: See Discharge Instructions below  Discharge Instructions     Patient was instructed to continue going to his hemodialysis treatments and he was reinforced about compliance with medications.    Discharge Medications       Medication List    ASK your doctor about these medications        amLODipine 5 MG tablet  Commonly known as:  NORVASC  Take 5 mg by mouth daily.     metoprolol succinate 25 MG 24 hr tablet  Commonly known as:  TOPROL-XL  Take 25 mg by mouth daily.     sevelamer 800 MG tablet  Commonly known as:  RENAGEL  Take 800 mg by mouth 3 (three) times daily with meals.     thiamine 100 MG tablet  Commonly known as:  VITAMIN B-1  Take 100 mg by mouth daily.     vitamin B-12 1000 MCG tablet  Commonly known as:  CYANOCOBALAMIN  Take 1,000 mcg by mouth daily.        Major procedures and Radiology Reports - PLEASE review detailed and final reports for all details, in brief -     Dg Chest Port 1 View  12/18/2015  CLINICAL DATA:  Weakness for 3 weeks, missed dialysis EXAM: PORTABLE CHEST 1 VIEW COMPARISON:  10/22/2013 FINDINGS: Borderline cardiomegaly. No acute infiltrate or pleural effusion. No pulmonary edema. Mild degenerative changes thoracic spine. IMPRESSION: No active disease. Electronically Signed   By: Lahoma Crocker M.D.   On: 12/18/2015 22:41    Micro Results     Recent Results (from the past 240 hour(s))  MRSA PCR Screening     Status: None   Collection Time: 12/19/15  2:56 AM  Result Value Ref Range Status   MRSA by PCR NEGATIVE NEGATIVE Final    Comment:        The GeneXpert MRSA Assay (FDA approved for NASAL specimens only), is one component of a comprehensive MRSA colonization surveillance program. It is not intended to diagnose  MRSA infection nor to guide or monitor treatment for MRSA infections. Performed at [facility]        Today   Subjective    Raymond [name] patient is feeling much better, his weakness and shortness of breath had resolved. Patient is tolerating adequate by mouth intake. Normal bowel movements, no nausea or vomiting. Objective   Blood pressure 156/110, pulse 85, temperature 98.2 F (36.8 C), temperature source Oral, resp. rate 20, height 5\' 7"  (1.702 m), weight 75.9 kg (167 lb 5.3 oz), SpO2  99 %.   Intake/Output Summary (Last 24 hours) at 12/21/15 1158 Last data filed at 12/21/15 0906  Gross per 24 hour  Intake   1020 ml  Output      0 ml  Net   1020 ml    Exam Gen. Awake and alert Conjunctiva mild pelvis no icterus Oral mucosa moist Neck supple Chest. Lungs clear to auscultation bilaterally. Heart S1-S2 present rhythmic no gallops or murmurs. Abdomen soft nontender Lower extremities no edema   Data Review   CBC w Diff: Lab Results  Component Value Date   WBC 3.1* 12/19/2015   HGB 8.4* 12/19/2015   HCT 27.3* 12/19/2015   PLT 129* 12/19/2015   LYMPHOPCT 52 12/18/2015   MONOPCT 11 12/18/2015   EOSPCT 1 12/18/2015   BASOPCT 0 12/18/2015    CMP: Lab Results  Component Value Date   NA 131* 12/21/2015   K 5.3* 12/21/2015   CL 91* 12/21/2015   CO2 24 12/21/2015   BUN 46* 12/21/2015   CREATININE 12.41* 12/21/2015   PROT 7.2 12/18/2015   ALBUMIN 2.7* 12/19/2015   BILITOT 0.7 12/18/2015   ALKPHOS 57 12/18/2015   AST 6* 12/18/2015   ALT 8* 12/18/2015  .   Total Time in preparing paper work, data evaluation and todays exam - 45 minutes  Tawni Millers M.D on 12/21/2015 at 11:58 AM  [facility]   Office  [phone]

## 2015-12-22 LAB — GLUCOSE, CAPILLARY
Glucose-Capillary: 85 mg/dL (ref 65–99)
Glucose-Capillary: 88 mg/dL (ref 65–99)
Glucose-Capillary: 107 mg/dL — ABNORMAL HIGH (ref 65–99)

## 2015-12-22 NOTE — Progress Notes (Signed)
Pt discharged to brother's home. Pt. Is alert and oriented. Pt is hemodynamically stable. IV removed. AVS reviewed with pt. Capable of re verbalizing medication regimen. Discharge plan appropriate and in place.

## 2015-12-22 NOTE — Clinical Social Work Note (Addendum)
CSW discussed with patient the option of ALF.  Patient agreed for CSW to look at ALFs for patient, CSW contacted [facility] who requested patient's information to be faxed to them, CSW waiting for call back.  12:00pm  CSW received phone call from [facility] who said they do not take patient who are on dialysis.  CSW contacted [facility] who requested patient's clinicals be sent to ALF.  CSW faxed clinicals to facility, awaiting review of patient's record.  1:30 pm  CSW received phone call from [facility] who said they can not take patient because he does not have any memory deficits.  CSW also spoke to [facility] who said they can review patient's information, but can not accept him today.  CSW also spoke to [facility] who can review patient's information as well to determine if he meets their criteria.  CSW also spoke to [facility] who will assess patient and determine if they can take him.  CSW awaiting response back from ALFs.  5:00 pm  CSW gave patient information on [place] for patient to call to make arrangements for him to go to halfway house.  Patient reported that none of the halfway houses had beds.  Patient reported to nurse that his brother will let him stay with him temporarily.  Patient will discharge to his brother's house.    Jones Broom. [place], MSW, [place] 12/22/2015 11:14 AM

## 2015-12-22 NOTE — NC FL2 (Signed)
[place] MEDICAID FL2 LEVEL OF CARE SCREENING TOOL     IDENTIFICATION  Patient Name: Raymond [name] Birthdate: 01/02/1956 Sex: male Admission Date (Current Location): 12/18/2015  [facility] and [place] Number:  Producer, television/film/video and Address:  [facility], [address] [place], [place] [address]      Provider Number: [id number]  Attending Physician Name and Address:  Coralie Keens, *  Relative Name and Phone Number:       Current Level of Care: Hospital Recommended Level of Care: Assisted Living Facility Prior Approval Number:    Date Approved/Denied:   PASRR Number:    Discharge Plan: Other (Comment) (ALF )    Current Diagnoses: Patient Active Problem List   Diagnosis Date Noted  . Hyperkalemia 12/19/2015  . Hypoglycemia 12/19/2015  . Polysubstance abuse 12/19/2015  . Homelessness 12/19/2015  . Anemia of chronic disease 12/19/2015  . Chest pain 12/19/2013  . Pain in limb 12/04/2012  . End stage renal disease (HCC) 12/04/2012  . HEPATITIS C 09/07/2006  . HYPERCHOLESTEROLEMIA 09/07/2006  . HYPERTENSION, BENIGN SYSTEMIC 09/07/2006    Orientation RESPIRATION BLADDER Height & Weight     Self, Time, Situation, Place  Normal Continent Weight: 154 lb 5.2 oz (70 kg) Height:  5\' 7"  (170.2 cm)  BEHAVIORAL SYMPTOMS/MOOD NEUROLOGICAL BOWEL NUTRITION STATUS      Continent Diet (Renal)  AMBULATORY STATUS COMMUNICATION OF NEEDS Skin   Independent Verbally Normal                       Personal Care Assistance Level of Assistance  Bathing, Feeding, Dressing Bathing Assistance: Independent Feeding assistance: Independent Dressing Assistance: Independent     Functional Limitations Info  Sight, Hearing, Speech Sight Info: Adequate Hearing Info: Adequate Speech Info: Adequate    SPECIAL CARE FACTORS FREQUENCY                       Contractures      Additional Factors Info  Code Status   Dialysis  Code Status  Info: Full code    M/W/F at [facility]. [facility]         Current Medications (12/22/2015):  This is the current hospital active medication list Current Facility-Administered Medications  Medication Dose Route Frequency Provider Last Rate Last Dose  . acetaminophen (TYLENOL) tablet 650 mg  650 mg Oral Q6H PRN Clydie Braun, MD   650 mg at 12/20/15 2227   Or  . acetaminophen (TYLENOL) suppository 650 mg  650 mg Rectal Q6H PRN Clydie Braun, MD      . albuterol (PROVENTIL) (2.5 MG/3ML) 0.083% nebulizer solution 2.5 mg  2.5 mg Nebulization Q2H PRN Rondell A Katrinka Blazing, MD      . amLODipine (NORVASC) tablet 5 mg  5 mg Oral Daily Aidynn Polendo Annett Gula, MD   5 mg at 12/21/15 1012  . cinacalcet (SENSIPAR) tablet 30 mg  30 mg Oral Q supper Pola Corn, NP   30 mg at 12/21/15 1718  . Darbepoetin Alfa (ARANESP) injection 200 mcg  200 mcg Intravenous Q Mon-HD Pola Corn, NP   0 mcg at 12/21/15 1624  . folic acid (FOLVITE) tablet 1 mg  1 mg Oral Daily Rondell A Katrinka Blazing, MD   1 mg at 12/21/15 1012  . heparin injection 5,000 Units  5,000 Units Subcutaneous Q8H Clydie Braun, MD   5,000 Units at 12/22/15 0542  . hydrALAZINE (  APRESOLINE) injection 10 mg  10 mg Intravenous Q4H PRN Clydie Braun, MD   10 mg at 12/20/15 1738  . metoprolol (LOPRESSOR) tablet 50 mg  50 mg Oral BID Pola Corn, NP   50 mg at 12/21/15 2250  . multivitamin with minerals tablet 1 tablet  1 tablet Oral Daily Clydie Braun, MD   1 tablet at 12/21/15 1012  . ondansetron (ZOFRAN) tablet 4 mg  4 mg Oral Q6H PRN Clydie Braun, MD       Or  . ondansetron (ZOFRAN) injection 4 mg  4 mg Intravenous Q6H PRN Clydie Braun, MD   4 mg at 12/22/15 0407  . sevelamer carbonate (RENVELA) tablet 4,000 mg  4,000 mg Oral TID WC Pola Corn, NP   4,000 mg at 12/21/15 1718  . sodium chloride flush (NS) 0.9 % injection 3 mL  3 mL Intravenous Q12H Clydie Braun, MD   3 mL at 12/21/15 2250  . sodium  polystyrene (KAYEXALATE) 15 GM/60ML suspension 30 g  30 g Oral Once [facility], PA-C   30 g at 12/18/15 2340  . thiamine (VITAMIN B-1) tablet 100 mg  100 mg Oral Daily Rondell Burtis Junes, MD   100 mg at 12/21/15 1013  . vitamin B-12 (CYANOCOBALAMIN) tablet 1,000 mcg  1,000 mcg Oral Daily Clydie Braun, MD   1,000 mcg at 12/21/15 1012     Discharge Medications: Please see discharge summary for a list of discharge medications.  Relevant Imaging Results:  Relevant Lab Results:   Additional Information SSN [id number]  [name], [place]

## 2015-12-22 NOTE — Progress Notes (Signed)
0400 pt vomited an unknown amount of emesis in toilet.  Emesis was tan with small bits of undigested food.  Pt had bowel sounds ,abd nondistended. Pt reports he had a bowel movement yesterday.  Medicated him with 4 mg IV Zofran .  He reports decreased nausea this am;states his stomach has been hurting since admission.

## 2015-12-22 NOTE — Progress Notes (Signed)
[facility] Progress Note   Subjective: "I'm going home today". Dressed, ready to leave however, per MSW, ALF placement still being reviewed.  No C/Os.   Objective Filed Vitals:   12/21/15 1630 12/21/15 1643 12/21/15 2030 12/22/15 0556  BP: 130/94 114/76 126/83 161/88  Pulse: 65 66 79 80  Temp:  98 F (36.7 C) 98.6 F (37 C) 98 F (36.7 C)  TempSrc:  Oral Oral Oral  Resp:  16 18 16   Height:      Weight:  70 kg (154 lb 5.2 oz)    SpO2:  100% 97% 99%   Physical Exam General: Cooperative NAD Heart: S1,S2, RRR Lungs: Bilateral breath sounds CTA Abdomen: Active BS non-tender Extremities: No LE edema Dialysis Access: LFA AVF aneurysmal + bruit.   Dialysis Orders: [facility] MWF 4h 2.2 bath 70kg P4 Hep 2000 Calcitriol 1.14mcg qTx Mircera 225 q2wk, was due 6/7 IV Fe: none  Additional Objective Labs: Basic Metabolic Panel:  Recent Labs Lab 12/18/15 2210  12/19/15 0010 12/19/15 0559 12/21/15 0425  NA 135  < > 135 137 131*  K 6.7*  < > 5.2* 5.6* 5.3*  CL 95*  < > 100* 93* 91*  CO2 19*  --   --  22 24  GLUCOSE 82  < > 48* 65 86  BUN 84*  < > 80* 85* 46*  CREATININE 20.44*  < > >18.00* 20.49* 12.41*  CALCIUM 8.7*  --   --  9.0 9.3  PHOS  --   --   --  11.4*  --   < > = values in this interval not displayed. Liver Function Tests:  Recent Labs Lab 12/18/15 2210 12/19/15 0559  AST 6*  --   ALT 8*  --   ALKPHOS 57  --   BILITOT 0.7  --   PROT 7.2  --   ALBUMIN 3.2* 2.7*   No results for input(s): LIPASE, AMYLASE in the last 168 hours. CBC:  Recent Labs Lab 12/18/15 2210  12/19/15 0010 12/19/15 0559 12/21/15 1230  WBC 3.5*  --   --  3.1* 2.8*  NEUTROABS 1.3*  --   --   --   --   HGB 8.5*  < > 10.2* 8.4* 9.0*  HCT 26.9*  < > 30.0* 27.3* 28.5*  MCV 96.4  --   --  96.5 95.6  PLT 140*  --   --  129* 123*  < > = values in this interval not displayed. Blood Culture No results found for: SDES, SPECREQUEST, CULT, REPTSTATUS  Cardiac  Enzymes: No results for input(s): CKTOTAL, CKMB, CKMBINDEX, TROPONINI in the last 168 hours. CBG:  Recent Labs Lab 12/21/15 0352 12/21/15 0812 12/21/15 2158 12/22/15 0745 12/22/15 1206  GLUCAP 82 83 101* 85 88   Iron Studies: No results for input(s): IRON, TIBC, TRANSFERRIN, FERRITIN in the last 72 hours. @lablastinr3 @ Studies/Results: No results found. Medications:   . amLODipine  5 mg Oral Daily  . cinacalcet  30 mg Oral Q supper  . darbepoetin (ARANESP) injection - DIALYSIS  200 mcg Intravenous Q Mon-HD  . folic acid  1 mg Oral Daily  . heparin  5,000 Units Subcutaneous Q8H  . metoprolol tartrate  50 mg Oral BID  . multivitamin with minerals  1 tablet Oral Daily  . sevelamer carbonate  4,000 mg Oral TID WC  . sodium chloride flush  3 mL Intravenous Q12H  . sodium polystyrene  30 g Oral Once  . thiamine  100 mg Oral Daily  . vitamin B-12  1,000 mcg Oral Daily     Assessment/Plan: 1. Hyperkalemia/nonadherence to HD schedule: K+ 5.3. For HD today. Hyperkalemia resolved. For DC today. Says he will attend HD tomorrow on schedule  2. ESRD - MWF [facility] for HD tomorrow in OP facility.  3. Anemia - HGB 8.4 12/21/15.  Rec'd ESA with HD yesterday. Max ESA dose in center.  4. Secondary hyperparathyroidism - cont binders, sensipar VDRA Last Phos 11.4 binder dose incorrect-adjusted.  5. HTN/volume - HD yesterday. Pre wt 74.7 kg Net UF 4913 Post wt 70kg. At OP EDW. Metoprolol resumed. Better BP Control.  6. Nutrition - renal diet, reinforce fld restrictions. Renal vit/prostat.  7. Homelessness: MSW working with pt. For DC today to ALF, possibly [facility]. Records being reviewed.   [name] NP-C 12/21/2015, 11:24 AM  [facility] [phone]   Pt seen, examined and agree w A/P as above.  Kelly Splinter MD [employer] pager [phone]    cell [phone] 12/22/2015, 1:03 PM

## 2015-12-22 NOTE — Care Management Important Message (Signed)
Important Message  Patient Details  Name: Raymond [name] MRN: [id number] Date of Birth: 16-May-1956   Medicare Important Message Given:  Yes    [name] 12/22/2015, 8:57 AM

## 2015-12-23 DIAGNOSIS — D509 Iron deficiency anemia, unspecified: Secondary | ICD-10-CM | POA: Diagnosis not present

## 2015-12-23 DIAGNOSIS — N186 End stage renal disease: Secondary | ICD-10-CM | POA: Diagnosis not present

## 2015-12-23 DIAGNOSIS — D631 Anemia in chronic kidney disease: Secondary | ICD-10-CM | POA: Diagnosis not present

## 2015-12-23 DIAGNOSIS — N2581 Secondary hyperparathyroidism of renal origin: Secondary | ICD-10-CM | POA: Diagnosis not present

## 2015-12-25 DIAGNOSIS — N2581 Secondary hyperparathyroidism of renal origin: Secondary | ICD-10-CM | POA: Diagnosis not present

## 2015-12-25 DIAGNOSIS — N186 End stage renal disease: Secondary | ICD-10-CM | POA: Diagnosis not present

## 2015-12-25 DIAGNOSIS — D509 Iron deficiency anemia, unspecified: Secondary | ICD-10-CM | POA: Diagnosis not present

## 2015-12-25 DIAGNOSIS — D631 Anemia in chronic kidney disease: Secondary | ICD-10-CM | POA: Diagnosis not present

## 2015-12-28 DIAGNOSIS — D631 Anemia in chronic kidney disease: Secondary | ICD-10-CM | POA: Diagnosis not present

## 2015-12-28 DIAGNOSIS — N2581 Secondary hyperparathyroidism of renal origin: Secondary | ICD-10-CM | POA: Diagnosis not present

## 2015-12-28 DIAGNOSIS — D509 Iron deficiency anemia, unspecified: Secondary | ICD-10-CM | POA: Diagnosis not present

## 2015-12-28 DIAGNOSIS — N186 End stage renal disease: Secondary | ICD-10-CM | POA: Diagnosis not present

## 2015-12-29 LAB — DRUG SCREEN 10 W/CONF, SERUM
AMPHETAMINES, IA: NEGATIVE ng/mL
BARBITURATES, IA: NEGATIVE ug/mL
BENZODIAZEPINES, IA: NEGATIVE ng/mL
Cocaine & Metabolite, IA: POSITIVE ng/mL
METHADONE, IA: NEGATIVE ng/mL
Opiates, IA: NEGATIVE ng/mL
Oxycodones, IA: NEGATIVE ng/mL
PROPOXYPHENE, IA: NEGATIVE ng/mL
Phencyclidine, IA: NEGATIVE ng/mL
THC(MARIJUANA) METABOLITE, IA: NEGATIVE ng/mL

## 2015-12-29 LAB — COCAINE,MS,WB/SP RFX
Benzoylecgonine: >1500 ng/mL
COCAINE: NEGATIVE ng/mL
Cocaine Confirmation: POSITIVE

## 2015-12-30 DIAGNOSIS — N186 End stage renal disease: Secondary | ICD-10-CM | POA: Diagnosis not present

## 2015-12-30 DIAGNOSIS — D509 Iron deficiency anemia, unspecified: Secondary | ICD-10-CM | POA: Diagnosis not present

## 2015-12-30 DIAGNOSIS — D631 Anemia in chronic kidney disease: Secondary | ICD-10-CM | POA: Diagnosis not present

## 2015-12-30 DIAGNOSIS — N2581 Secondary hyperparathyroidism of renal origin: Secondary | ICD-10-CM | POA: Diagnosis not present

## 2016-01-01 DIAGNOSIS — D631 Anemia in chronic kidney disease: Secondary | ICD-10-CM | POA: Diagnosis not present

## 2016-01-01 DIAGNOSIS — N2581 Secondary hyperparathyroidism of renal origin: Secondary | ICD-10-CM | POA: Diagnosis not present

## 2016-01-01 DIAGNOSIS — N186 End stage renal disease: Secondary | ICD-10-CM | POA: Diagnosis not present

## 2016-01-01 DIAGNOSIS — D509 Iron deficiency anemia, unspecified: Secondary | ICD-10-CM | POA: Diagnosis not present

## 2016-01-04 DIAGNOSIS — D509 Iron deficiency anemia, unspecified: Secondary | ICD-10-CM | POA: Diagnosis not present

## 2016-01-04 DIAGNOSIS — D631 Anemia in chronic kidney disease: Secondary | ICD-10-CM | POA: Diagnosis not present

## 2016-01-04 DIAGNOSIS — N2581 Secondary hyperparathyroidism of renal origin: Secondary | ICD-10-CM | POA: Diagnosis not present

## 2016-01-04 DIAGNOSIS — N186 End stage renal disease: Secondary | ICD-10-CM | POA: Diagnosis not present

## 2016-01-06 DIAGNOSIS — N186 End stage renal disease: Secondary | ICD-10-CM | POA: Diagnosis not present

## 2016-01-06 DIAGNOSIS — D631 Anemia in chronic kidney disease: Secondary | ICD-10-CM | POA: Diagnosis not present

## 2016-01-06 DIAGNOSIS — N2581 Secondary hyperparathyroidism of renal origin: Secondary | ICD-10-CM | POA: Diagnosis not present

## 2016-01-06 DIAGNOSIS — D509 Iron deficiency anemia, unspecified: Secondary | ICD-10-CM | POA: Diagnosis not present

## 2016-01-08 DIAGNOSIS — N2889 Other specified disorders of kidney and ureter: Secondary | ICD-10-CM | POA: Diagnosis not present

## 2016-01-08 DIAGNOSIS — Z992 Dependence on renal dialysis: Secondary | ICD-10-CM | POA: Diagnosis not present

## 2016-01-08 DIAGNOSIS — N186 End stage renal disease: Secondary | ICD-10-CM | POA: Diagnosis not present

## 2016-01-11 DIAGNOSIS — D509 Iron deficiency anemia, unspecified: Secondary | ICD-10-CM | POA: Diagnosis not present

## 2016-01-11 DIAGNOSIS — D631 Anemia in chronic kidney disease: Secondary | ICD-10-CM | POA: Diagnosis not present

## 2016-01-11 DIAGNOSIS — N186 End stage renal disease: Secondary | ICD-10-CM | POA: Diagnosis not present

## 2016-01-11 DIAGNOSIS — N2581 Secondary hyperparathyroidism of renal origin: Secondary | ICD-10-CM | POA: Diagnosis not present

## 2016-01-13 DIAGNOSIS — D631 Anemia in chronic kidney disease: Secondary | ICD-10-CM | POA: Diagnosis not present

## 2016-01-13 DIAGNOSIS — D509 Iron deficiency anemia, unspecified: Secondary | ICD-10-CM | POA: Diagnosis not present

## 2016-01-13 DIAGNOSIS — N186 End stage renal disease: Secondary | ICD-10-CM | POA: Diagnosis not present

## 2016-01-13 DIAGNOSIS — N2581 Secondary hyperparathyroidism of renal origin: Secondary | ICD-10-CM | POA: Diagnosis not present

## 2016-01-15 DIAGNOSIS — D631 Anemia in chronic kidney disease: Secondary | ICD-10-CM | POA: Diagnosis not present

## 2016-01-15 DIAGNOSIS — D509 Iron deficiency anemia, unspecified: Secondary | ICD-10-CM | POA: Diagnosis not present

## 2016-01-15 DIAGNOSIS — N2581 Secondary hyperparathyroidism of renal origin: Secondary | ICD-10-CM | POA: Diagnosis not present

## 2016-01-15 DIAGNOSIS — N186 End stage renal disease: Secondary | ICD-10-CM | POA: Diagnosis not present

## 2016-01-18 DIAGNOSIS — N186 End stage renal disease: Secondary | ICD-10-CM | POA: Diagnosis not present

## 2016-01-18 DIAGNOSIS — D631 Anemia in chronic kidney disease: Secondary | ICD-10-CM | POA: Diagnosis not present

## 2016-01-18 DIAGNOSIS — D509 Iron deficiency anemia, unspecified: Secondary | ICD-10-CM | POA: Diagnosis not present

## 2016-01-18 DIAGNOSIS — N2581 Secondary hyperparathyroidism of renal origin: Secondary | ICD-10-CM | POA: Diagnosis not present

## 2016-01-20 DIAGNOSIS — D509 Iron deficiency anemia, unspecified: Secondary | ICD-10-CM | POA: Diagnosis not present

## 2016-01-20 DIAGNOSIS — N186 End stage renal disease: Secondary | ICD-10-CM | POA: Diagnosis not present

## 2016-01-20 DIAGNOSIS — D631 Anemia in chronic kidney disease: Secondary | ICD-10-CM | POA: Diagnosis not present

## 2016-01-20 DIAGNOSIS — N2581 Secondary hyperparathyroidism of renal origin: Secondary | ICD-10-CM | POA: Diagnosis not present

## 2016-01-22 DIAGNOSIS — N2581 Secondary hyperparathyroidism of renal origin: Secondary | ICD-10-CM | POA: Diagnosis not present

## 2016-01-22 DIAGNOSIS — D509 Iron deficiency anemia, unspecified: Secondary | ICD-10-CM | POA: Diagnosis not present

## 2016-01-22 DIAGNOSIS — N186 End stage renal disease: Secondary | ICD-10-CM | POA: Diagnosis not present

## 2016-01-22 DIAGNOSIS — D631 Anemia in chronic kidney disease: Secondary | ICD-10-CM | POA: Diagnosis not present

## 2016-01-25 DIAGNOSIS — N2581 Secondary hyperparathyroidism of renal origin: Secondary | ICD-10-CM | POA: Diagnosis not present

## 2016-01-25 DIAGNOSIS — D509 Iron deficiency anemia, unspecified: Secondary | ICD-10-CM | POA: Diagnosis not present

## 2016-01-25 DIAGNOSIS — N186 End stage renal disease: Secondary | ICD-10-CM | POA: Diagnosis not present

## 2016-01-25 DIAGNOSIS — D631 Anemia in chronic kidney disease: Secondary | ICD-10-CM | POA: Diagnosis not present

## 2016-01-27 DIAGNOSIS — D631 Anemia in chronic kidney disease: Secondary | ICD-10-CM | POA: Diagnosis not present

## 2016-01-27 DIAGNOSIS — N186 End stage renal disease: Secondary | ICD-10-CM | POA: Diagnosis not present

## 2016-01-27 DIAGNOSIS — N2581 Secondary hyperparathyroidism of renal origin: Secondary | ICD-10-CM | POA: Diagnosis not present

## 2016-01-27 DIAGNOSIS — D509 Iron deficiency anemia, unspecified: Secondary | ICD-10-CM | POA: Diagnosis not present

## 2016-01-29 DIAGNOSIS — D631 Anemia in chronic kidney disease: Secondary | ICD-10-CM | POA: Diagnosis not present

## 2016-01-29 DIAGNOSIS — N2581 Secondary hyperparathyroidism of renal origin: Secondary | ICD-10-CM | POA: Diagnosis not present

## 2016-01-29 DIAGNOSIS — N186 End stage renal disease: Secondary | ICD-10-CM | POA: Diagnosis not present

## 2016-01-29 DIAGNOSIS — D509 Iron deficiency anemia, unspecified: Secondary | ICD-10-CM | POA: Diagnosis not present

## 2016-02-01 DIAGNOSIS — D631 Anemia in chronic kidney disease: Secondary | ICD-10-CM | POA: Diagnosis not present

## 2016-02-01 DIAGNOSIS — N2581 Secondary hyperparathyroidism of renal origin: Secondary | ICD-10-CM | POA: Diagnosis not present

## 2016-02-01 DIAGNOSIS — D509 Iron deficiency anemia, unspecified: Secondary | ICD-10-CM | POA: Diagnosis not present

## 2016-02-01 DIAGNOSIS — N186 End stage renal disease: Secondary | ICD-10-CM | POA: Diagnosis not present

## 2016-02-03 DIAGNOSIS — D509 Iron deficiency anemia, unspecified: Secondary | ICD-10-CM | POA: Diagnosis not present

## 2016-02-03 DIAGNOSIS — D631 Anemia in chronic kidney disease: Secondary | ICD-10-CM | POA: Diagnosis not present

## 2016-02-03 DIAGNOSIS — N186 End stage renal disease: Secondary | ICD-10-CM | POA: Diagnosis not present

## 2016-02-03 DIAGNOSIS — N2581 Secondary hyperparathyroidism of renal origin: Secondary | ICD-10-CM | POA: Diagnosis not present

## 2016-02-05 DIAGNOSIS — D631 Anemia in chronic kidney disease: Secondary | ICD-10-CM | POA: Diagnosis not present

## 2016-02-05 DIAGNOSIS — N2581 Secondary hyperparathyroidism of renal origin: Secondary | ICD-10-CM | POA: Diagnosis not present

## 2016-02-05 DIAGNOSIS — N186 End stage renal disease: Secondary | ICD-10-CM | POA: Diagnosis not present

## 2016-02-05 DIAGNOSIS — D509 Iron deficiency anemia, unspecified: Secondary | ICD-10-CM | POA: Diagnosis not present

## 2016-02-08 DIAGNOSIS — D509 Iron deficiency anemia, unspecified: Secondary | ICD-10-CM | POA: Diagnosis not present

## 2016-02-08 DIAGNOSIS — N186 End stage renal disease: Secondary | ICD-10-CM | POA: Diagnosis not present

## 2016-02-08 DIAGNOSIS — D631 Anemia in chronic kidney disease: Secondary | ICD-10-CM | POA: Diagnosis not present

## 2016-02-08 DIAGNOSIS — N2889 Other specified disorders of kidney and ureter: Secondary | ICD-10-CM | POA: Diagnosis not present

## 2016-02-08 DIAGNOSIS — N2581 Secondary hyperparathyroidism of renal origin: Secondary | ICD-10-CM | POA: Diagnosis not present

## 2016-02-08 DIAGNOSIS — Z992 Dependence on renal dialysis: Secondary | ICD-10-CM | POA: Diagnosis not present

## 2016-02-10 DIAGNOSIS — D509 Iron deficiency anemia, unspecified: Secondary | ICD-10-CM | POA: Diagnosis not present

## 2016-02-10 DIAGNOSIS — D631 Anemia in chronic kidney disease: Secondary | ICD-10-CM | POA: Diagnosis not present

## 2016-02-10 DIAGNOSIS — N186 End stage renal disease: Secondary | ICD-10-CM | POA: Diagnosis not present

## 2016-02-10 DIAGNOSIS — N2581 Secondary hyperparathyroidism of renal origin: Secondary | ICD-10-CM | POA: Diagnosis not present

## 2016-02-12 DIAGNOSIS — N2581 Secondary hyperparathyroidism of renal origin: Secondary | ICD-10-CM | POA: Diagnosis not present

## 2016-02-12 DIAGNOSIS — N186 End stage renal disease: Secondary | ICD-10-CM | POA: Diagnosis not present

## 2016-02-12 DIAGNOSIS — D631 Anemia in chronic kidney disease: Secondary | ICD-10-CM | POA: Diagnosis not present

## 2016-02-12 DIAGNOSIS — D509 Iron deficiency anemia, unspecified: Secondary | ICD-10-CM | POA: Diagnosis not present

## 2016-02-15 DIAGNOSIS — N186 End stage renal disease: Secondary | ICD-10-CM | POA: Diagnosis not present

## 2016-02-15 DIAGNOSIS — N2581 Secondary hyperparathyroidism of renal origin: Secondary | ICD-10-CM | POA: Diagnosis not present

## 2016-02-15 DIAGNOSIS — D631 Anemia in chronic kidney disease: Secondary | ICD-10-CM | POA: Diagnosis not present

## 2016-02-15 DIAGNOSIS — D509 Iron deficiency anemia, unspecified: Secondary | ICD-10-CM | POA: Diagnosis not present

## 2016-02-16 ENCOUNTER — Emergency Department (HOSPITAL_COMMUNITY)
Admission: EM | Admit: 2016-02-16 | Discharge: 2016-02-16 | Disposition: A | Discharge status: Home / Self Care | Payer: No Typology Code available for payment source | Attending: Emergency Medicine | Admitting: Emergency Medicine

## 2016-02-16 ENCOUNTER — Emergency Department (HOSPITAL_COMMUNITY): Payer: No Typology Code available for payment source

## 2016-02-16 ENCOUNTER — Encounter (HOSPITAL_COMMUNITY): Payer: Self-pay | Admitting: Emergency Medicine

## 2016-02-16 DIAGNOSIS — S199XXA Unspecified injury of neck, initial encounter: Secondary | ICD-10-CM | POA: Diagnosis not present

## 2016-02-16 DIAGNOSIS — Z79899 Other long term (current) drug therapy: Secondary | ICD-10-CM | POA: Insufficient documentation

## 2016-02-16 DIAGNOSIS — R0781 Pleurodynia: Secondary | ICD-10-CM | POA: Diagnosis not present

## 2016-02-16 DIAGNOSIS — I120 Hypertensive chronic kidney disease with stage 5 chronic kidney disease or end stage renal disease: Secondary | ICD-10-CM | POA: Diagnosis not present

## 2016-02-16 DIAGNOSIS — N186 End stage renal disease: Secondary | ICD-10-CM | POA: Diagnosis not present

## 2016-02-16 DIAGNOSIS — Z87891 Personal history of nicotine dependence: Secondary | ICD-10-CM | POA: Diagnosis not present

## 2016-02-16 DIAGNOSIS — S2231XA Fracture of one rib, right side, initial encounter for closed fracture: Secondary | ICD-10-CM | POA: Diagnosis not present

## 2016-02-16 DIAGNOSIS — Y92410 Unspecified street and highway as the place of occurrence of the external cause: Secondary | ICD-10-CM | POA: Insufficient documentation

## 2016-02-16 DIAGNOSIS — Y939 Activity, unspecified: Secondary | ICD-10-CM | POA: Diagnosis not present

## 2016-02-16 DIAGNOSIS — M25511 Pain in right shoulder: Secondary | ICD-10-CM | POA: Diagnosis not present

## 2016-02-16 DIAGNOSIS — S4991XA Unspecified injury of right shoulder and upper arm, initial encounter: Secondary | ICD-10-CM | POA: Diagnosis not present

## 2016-02-16 DIAGNOSIS — Y999 Unspecified external cause status: Secondary | ICD-10-CM | POA: Diagnosis not present

## 2016-02-16 DIAGNOSIS — S299XXA Unspecified injury of thorax, initial encounter: Secondary | ICD-10-CM | POA: Diagnosis not present
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
## 2016-02-16 MED ORDER — HYDROCODONE-ACETAMINOPHEN 5-325 MG PO TABS: 1.0000 | ORAL_TABLET | Freq: Four times a day (QID) | ORAL | 0 refills | Status: DC | PRN
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
## 2016-02-16 NOTE — ED Notes (Signed)
Patient transported to X-ray 

## 2016-02-16 NOTE — ED Notes (Signed)
Patient right arm placed in a sling, PA Lawyer at bedside with patient.  Patient verbalized understanding of discharge instructions, medications and the need for follow up care.

## 2016-02-16 NOTE — ED Notes (Signed)
Patient provided and showed how to use an incentive spirometer.  Patient verbalized understanding and return demonstration.

## 2016-02-16 NOTE — ED Notes (Signed)
Pt to XR
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
## 2016-02-17 DIAGNOSIS — D631 Anemia in chronic kidney disease: Secondary | ICD-10-CM | POA: Diagnosis not present

## 2016-02-17 DIAGNOSIS — N2581 Secondary hyperparathyroidism of renal origin: Secondary | ICD-10-CM | POA: Diagnosis not present

## 2016-02-17 DIAGNOSIS — D509 Iron deficiency anemia, unspecified: Secondary | ICD-10-CM | POA: Diagnosis not present

## 2016-02-17 DIAGNOSIS — N186 End stage renal disease: Secondary | ICD-10-CM | POA: Diagnosis not present

## 2016-02-19 DIAGNOSIS — N186 End stage renal disease: Secondary | ICD-10-CM | POA: Diagnosis not present

## 2016-02-19 DIAGNOSIS — D631 Anemia in chronic kidney disease: Secondary | ICD-10-CM | POA: Diagnosis not present

## 2016-02-19 DIAGNOSIS — D509 Iron deficiency anemia, unspecified: Secondary | ICD-10-CM | POA: Diagnosis not present

## 2016-02-19 DIAGNOSIS — N2581 Secondary hyperparathyroidism of renal origin: Secondary | ICD-10-CM | POA: Diagnosis not present

## 2016-02-22 DIAGNOSIS — D509 Iron deficiency anemia, unspecified: Secondary | ICD-10-CM | POA: Diagnosis not present

## 2016-02-22 DIAGNOSIS — N186 End stage renal disease: Secondary | ICD-10-CM | POA: Diagnosis not present

## 2016-02-22 DIAGNOSIS — N2581 Secondary hyperparathyroidism of renal origin: Secondary | ICD-10-CM | POA: Diagnosis not present

## 2016-02-22 DIAGNOSIS — D631 Anemia in chronic kidney disease: Secondary | ICD-10-CM | POA: Diagnosis not present

## 2016-02-24 DIAGNOSIS — N2581 Secondary hyperparathyroidism of renal origin: Secondary | ICD-10-CM | POA: Diagnosis not present

## 2016-02-24 DIAGNOSIS — D631 Anemia in chronic kidney disease: Secondary | ICD-10-CM | POA: Diagnosis not present

## 2016-02-24 DIAGNOSIS — D509 Iron deficiency anemia, unspecified: Secondary | ICD-10-CM | POA: Diagnosis not present

## 2016-02-24 DIAGNOSIS — N186 End stage renal disease: Secondary | ICD-10-CM | POA: Diagnosis not present

## 2016-02-29 DIAGNOSIS — N2581 Secondary hyperparathyroidism of renal origin: Secondary | ICD-10-CM | POA: Diagnosis not present

## 2016-02-29 DIAGNOSIS — N186 End stage renal disease: Secondary | ICD-10-CM | POA: Diagnosis not present

## 2016-02-29 DIAGNOSIS — D509 Iron deficiency anemia, unspecified: Secondary | ICD-10-CM | POA: Diagnosis not present

## 2016-02-29 DIAGNOSIS — D631 Anemia in chronic kidney disease: Secondary | ICD-10-CM | POA: Diagnosis not present

## 2016-03-02 DIAGNOSIS — N186 End stage renal disease: Secondary | ICD-10-CM | POA: Diagnosis not present

## 2016-03-02 DIAGNOSIS — D509 Iron deficiency anemia, unspecified: Secondary | ICD-10-CM | POA: Diagnosis not present

## 2016-03-02 DIAGNOSIS — N2581 Secondary hyperparathyroidism of renal origin: Secondary | ICD-10-CM | POA: Diagnosis not present

## 2016-03-02 DIAGNOSIS — D631 Anemia in chronic kidney disease: Secondary | ICD-10-CM | POA: Diagnosis not present

## 2016-03-04 DIAGNOSIS — N2581 Secondary hyperparathyroidism of renal origin: Secondary | ICD-10-CM | POA: Diagnosis not present

## 2016-03-04 DIAGNOSIS — D509 Iron deficiency anemia, unspecified: Secondary | ICD-10-CM | POA: Diagnosis not present

## 2016-03-04 DIAGNOSIS — N186 End stage renal disease: Secondary | ICD-10-CM | POA: Diagnosis not present

## 2016-03-04 DIAGNOSIS — D631 Anemia in chronic kidney disease: Secondary | ICD-10-CM | POA: Diagnosis not present

## 2016-03-07 DIAGNOSIS — N2581 Secondary hyperparathyroidism of renal origin: Secondary | ICD-10-CM | POA: Diagnosis not present

## 2016-03-07 DIAGNOSIS — N186 End stage renal disease: Secondary | ICD-10-CM | POA: Diagnosis not present

## 2016-03-07 DIAGNOSIS — D631 Anemia in chronic kidney disease: Secondary | ICD-10-CM | POA: Diagnosis not present

## 2016-03-07 DIAGNOSIS — D509 Iron deficiency anemia, unspecified: Secondary | ICD-10-CM | POA: Diagnosis not present

## 2016-03-09 DIAGNOSIS — D509 Iron deficiency anemia, unspecified: Secondary | ICD-10-CM | POA: Diagnosis not present

## 2016-03-09 DIAGNOSIS — D631 Anemia in chronic kidney disease: Secondary | ICD-10-CM | POA: Diagnosis not present

## 2016-03-09 DIAGNOSIS — N2581 Secondary hyperparathyroidism of renal origin: Secondary | ICD-10-CM | POA: Diagnosis not present

## 2016-03-09 DIAGNOSIS — N186 End stage renal disease: Secondary | ICD-10-CM | POA: Diagnosis not present

## 2016-03-10 DIAGNOSIS — N2889 Other specified disorders of kidney and ureter: Secondary | ICD-10-CM | POA: Diagnosis not present

## 2016-03-10 DIAGNOSIS — Z992 Dependence on renal dialysis: Secondary | ICD-10-CM | POA: Diagnosis not present

## 2016-03-10 DIAGNOSIS — N186 End stage renal disease: Secondary | ICD-10-CM | POA: Diagnosis not present

## 2016-03-14 DIAGNOSIS — N186 End stage renal disease: Secondary | ICD-10-CM | POA: Diagnosis not present

## 2016-03-14 DIAGNOSIS — D631 Anemia in chronic kidney disease: Secondary | ICD-10-CM | POA: Diagnosis not present

## 2016-03-14 DIAGNOSIS — D509 Iron deficiency anemia, unspecified: Secondary | ICD-10-CM | POA: Diagnosis not present

## 2016-03-14 DIAGNOSIS — Z23 Encounter for immunization: Secondary | ICD-10-CM | POA: Diagnosis not present

## 2016-03-14 DIAGNOSIS — N2581 Secondary hyperparathyroidism of renal origin: Secondary | ICD-10-CM | POA: Diagnosis not present

## 2016-03-16 DIAGNOSIS — N2581 Secondary hyperparathyroidism of renal origin: Secondary | ICD-10-CM | POA: Diagnosis not present

## 2016-03-16 DIAGNOSIS — D509 Iron deficiency anemia, unspecified: Secondary | ICD-10-CM | POA: Diagnosis not present

## 2016-03-16 DIAGNOSIS — Z23 Encounter for immunization: Secondary | ICD-10-CM | POA: Diagnosis not present

## 2016-03-16 DIAGNOSIS — D631 Anemia in chronic kidney disease: Secondary | ICD-10-CM | POA: Diagnosis not present

## 2016-03-16 DIAGNOSIS — N186 End stage renal disease: Secondary | ICD-10-CM | POA: Diagnosis not present

## 2016-03-18 DIAGNOSIS — D631 Anemia in chronic kidney disease: Secondary | ICD-10-CM | POA: Diagnosis not present

## 2016-03-18 DIAGNOSIS — D509 Iron deficiency anemia, unspecified: Secondary | ICD-10-CM | POA: Diagnosis not present

## 2016-03-18 DIAGNOSIS — Z23 Encounter for immunization: Secondary | ICD-10-CM | POA: Diagnosis not present

## 2016-03-18 DIAGNOSIS — N186 End stage renal disease: Secondary | ICD-10-CM | POA: Diagnosis not present

## 2016-03-18 DIAGNOSIS — N2581 Secondary hyperparathyroidism of renal origin: Secondary | ICD-10-CM | POA: Diagnosis not present

## 2016-03-21 DIAGNOSIS — N2581 Secondary hyperparathyroidism of renal origin: Secondary | ICD-10-CM | POA: Diagnosis not present

## 2016-03-21 DIAGNOSIS — N186 End stage renal disease: Secondary | ICD-10-CM | POA: Diagnosis not present

## 2016-03-21 DIAGNOSIS — Z23 Encounter for immunization: Secondary | ICD-10-CM | POA: Diagnosis not present

## 2016-03-21 DIAGNOSIS — D509 Iron deficiency anemia, unspecified: Secondary | ICD-10-CM | POA: Diagnosis not present

## 2016-03-21 DIAGNOSIS — D631 Anemia in chronic kidney disease: Secondary | ICD-10-CM | POA: Diagnosis not present

## 2016-03-23 DIAGNOSIS — D631 Anemia in chronic kidney disease: Secondary | ICD-10-CM | POA: Diagnosis not present

## 2016-03-23 DIAGNOSIS — N2581 Secondary hyperparathyroidism of renal origin: Secondary | ICD-10-CM | POA: Diagnosis not present

## 2016-03-23 DIAGNOSIS — D509 Iron deficiency anemia, unspecified: Secondary | ICD-10-CM | POA: Diagnosis not present

## 2016-03-23 DIAGNOSIS — N186 End stage renal disease: Secondary | ICD-10-CM | POA: Diagnosis not present

## 2016-03-23 DIAGNOSIS — Z23 Encounter for immunization: Secondary | ICD-10-CM | POA: Diagnosis not present

## 2016-03-25 DIAGNOSIS — Z23 Encounter for immunization: Secondary | ICD-10-CM | POA: Diagnosis not present

## 2016-03-25 DIAGNOSIS — N2581 Secondary hyperparathyroidism of renal origin: Secondary | ICD-10-CM | POA: Diagnosis not present

## 2016-03-25 DIAGNOSIS — N186 End stage renal disease: Secondary | ICD-10-CM | POA: Diagnosis not present

## 2016-03-25 DIAGNOSIS — D509 Iron deficiency anemia, unspecified: Secondary | ICD-10-CM | POA: Diagnosis not present

## 2016-03-25 DIAGNOSIS — D631 Anemia in chronic kidney disease: Secondary | ICD-10-CM | POA: Diagnosis not present

## 2016-03-28 DIAGNOSIS — N2581 Secondary hyperparathyroidism of renal origin: Secondary | ICD-10-CM | POA: Diagnosis not present

## 2016-03-28 DIAGNOSIS — D509 Iron deficiency anemia, unspecified: Secondary | ICD-10-CM | POA: Diagnosis not present

## 2016-03-28 DIAGNOSIS — Z23 Encounter for immunization: Secondary | ICD-10-CM | POA: Diagnosis not present

## 2016-03-28 DIAGNOSIS — N186 End stage renal disease: Secondary | ICD-10-CM | POA: Diagnosis not present

## 2016-03-28 DIAGNOSIS — D631 Anemia in chronic kidney disease: Secondary | ICD-10-CM | POA: Diagnosis not present

## 2016-03-30 DIAGNOSIS — D509 Iron deficiency anemia, unspecified: Secondary | ICD-10-CM | POA: Diagnosis not present

## 2016-03-30 DIAGNOSIS — N2581 Secondary hyperparathyroidism of renal origin: Secondary | ICD-10-CM | POA: Diagnosis not present

## 2016-03-30 DIAGNOSIS — D631 Anemia in chronic kidney disease: Secondary | ICD-10-CM | POA: Diagnosis not present

## 2016-03-30 DIAGNOSIS — N186 End stage renal disease: Secondary | ICD-10-CM | POA: Diagnosis not present

## 2016-03-30 DIAGNOSIS — Z23 Encounter for immunization: Secondary | ICD-10-CM | POA: Diagnosis not present

## 2016-04-01 DIAGNOSIS — D509 Iron deficiency anemia, unspecified: Secondary | ICD-10-CM | POA: Diagnosis not present

## 2016-04-01 DIAGNOSIS — N2581 Secondary hyperparathyroidism of renal origin: Secondary | ICD-10-CM | POA: Diagnosis not present

## 2016-04-01 DIAGNOSIS — N186 End stage renal disease: Secondary | ICD-10-CM | POA: Diagnosis not present

## 2016-04-01 DIAGNOSIS — D631 Anemia in chronic kidney disease: Secondary | ICD-10-CM | POA: Diagnosis not present

## 2016-04-01 DIAGNOSIS — Z23 Encounter for immunization: Secondary | ICD-10-CM | POA: Diagnosis not present

## 2016-04-04 DIAGNOSIS — N2581 Secondary hyperparathyroidism of renal origin: Secondary | ICD-10-CM | POA: Diagnosis not present

## 2016-04-04 DIAGNOSIS — Z23 Encounter for immunization: Secondary | ICD-10-CM | POA: Diagnosis not present

## 2016-04-04 DIAGNOSIS — D509 Iron deficiency anemia, unspecified: Secondary | ICD-10-CM | POA: Diagnosis not present

## 2016-04-04 DIAGNOSIS — N186 End stage renal disease: Secondary | ICD-10-CM | POA: Diagnosis not present

## 2016-04-04 DIAGNOSIS — D631 Anemia in chronic kidney disease: Secondary | ICD-10-CM | POA: Diagnosis not present

## 2016-04-06 DIAGNOSIS — N2581 Secondary hyperparathyroidism of renal origin: Secondary | ICD-10-CM | POA: Diagnosis not present

## 2016-04-06 DIAGNOSIS — D509 Iron deficiency anemia, unspecified: Secondary | ICD-10-CM | POA: Diagnosis not present

## 2016-04-06 DIAGNOSIS — N186 End stage renal disease: Secondary | ICD-10-CM | POA: Diagnosis not present

## 2016-04-06 DIAGNOSIS — D631 Anemia in chronic kidney disease: Secondary | ICD-10-CM | POA: Diagnosis not present

## 2016-04-06 DIAGNOSIS — Z23 Encounter for immunization: Secondary | ICD-10-CM | POA: Diagnosis not present

## 2016-04-08 DIAGNOSIS — Z23 Encounter for immunization: Secondary | ICD-10-CM | POA: Diagnosis not present

## 2016-04-08 DIAGNOSIS — N2581 Secondary hyperparathyroidism of renal origin: Secondary | ICD-10-CM | POA: Diagnosis not present

## 2016-04-08 DIAGNOSIS — D509 Iron deficiency anemia, unspecified: Secondary | ICD-10-CM | POA: Diagnosis not present

## 2016-04-08 DIAGNOSIS — N186 End stage renal disease: Secondary | ICD-10-CM | POA: Diagnosis not present

## 2016-04-08 DIAGNOSIS — D631 Anemia in chronic kidney disease: Secondary | ICD-10-CM | POA: Diagnosis not present

## 2016-04-09 DIAGNOSIS — N186 End stage renal disease: Secondary | ICD-10-CM | POA: Diagnosis not present

## 2016-04-09 DIAGNOSIS — Z992 Dependence on renal dialysis: Secondary | ICD-10-CM | POA: Diagnosis not present

## 2016-04-09 DIAGNOSIS — N2889 Other specified disorders of kidney and ureter: Secondary | ICD-10-CM | POA: Diagnosis not present

## 2016-04-11 DIAGNOSIS — D509 Iron deficiency anemia, unspecified: Secondary | ICD-10-CM | POA: Diagnosis not present

## 2016-04-11 DIAGNOSIS — N186 End stage renal disease: Secondary | ICD-10-CM | POA: Diagnosis not present

## 2016-04-11 DIAGNOSIS — N2581 Secondary hyperparathyroidism of renal origin: Secondary | ICD-10-CM | POA: Diagnosis not present

## 2016-04-13 DIAGNOSIS — D509 Iron deficiency anemia, unspecified: Secondary | ICD-10-CM | POA: Diagnosis not present

## 2016-04-13 DIAGNOSIS — N2581 Secondary hyperparathyroidism of renal origin: Secondary | ICD-10-CM | POA: Diagnosis not present

## 2016-04-13 DIAGNOSIS — N186 End stage renal disease: Secondary | ICD-10-CM | POA: Diagnosis not present

## 2016-04-15 DIAGNOSIS — D509 Iron deficiency anemia, unspecified: Secondary | ICD-10-CM | POA: Diagnosis not present

## 2016-04-15 DIAGNOSIS — N186 End stage renal disease: Secondary | ICD-10-CM | POA: Diagnosis not present

## 2016-04-15 DIAGNOSIS — N2581 Secondary hyperparathyroidism of renal origin: Secondary | ICD-10-CM | POA: Diagnosis not present

## 2016-04-18 DIAGNOSIS — N186 End stage renal disease: Secondary | ICD-10-CM | POA: Diagnosis not present

## 2016-04-18 DIAGNOSIS — D509 Iron deficiency anemia, unspecified: Secondary | ICD-10-CM | POA: Diagnosis not present

## 2016-04-18 DIAGNOSIS — N2581 Secondary hyperparathyroidism of renal origin: Secondary | ICD-10-CM | POA: Diagnosis not present

## 2016-04-20 DIAGNOSIS — N2581 Secondary hyperparathyroidism of renal origin: Secondary | ICD-10-CM | POA: Diagnosis not present

## 2016-04-20 DIAGNOSIS — D509 Iron deficiency anemia, unspecified: Secondary | ICD-10-CM | POA: Diagnosis not present

## 2016-04-20 DIAGNOSIS — N186 End stage renal disease: Secondary | ICD-10-CM | POA: Diagnosis not present

## 2016-04-25 DIAGNOSIS — N2581 Secondary hyperparathyroidism of renal origin: Secondary | ICD-10-CM | POA: Diagnosis not present

## 2016-04-25 DIAGNOSIS — N186 End stage renal disease: Secondary | ICD-10-CM | POA: Diagnosis not present

## 2016-04-25 DIAGNOSIS — D509 Iron deficiency anemia, unspecified: Secondary | ICD-10-CM | POA: Diagnosis not present

## 2016-05-02 DIAGNOSIS — N2581 Secondary hyperparathyroidism of renal origin: Secondary | ICD-10-CM | POA: Diagnosis not present

## 2016-05-02 DIAGNOSIS — N186 End stage renal disease: Secondary | ICD-10-CM | POA: Diagnosis not present

## 2016-05-02 DIAGNOSIS — D509 Iron deficiency anemia, unspecified: Secondary | ICD-10-CM | POA: Diagnosis not present

## 2016-05-04 DIAGNOSIS — N186 End stage renal disease: Secondary | ICD-10-CM | POA: Diagnosis not present

## 2016-05-04 DIAGNOSIS — D509 Iron deficiency anemia, unspecified: Secondary | ICD-10-CM | POA: Diagnosis not present

## 2016-05-04 DIAGNOSIS — N2581 Secondary hyperparathyroidism of renal origin: Secondary | ICD-10-CM | POA: Diagnosis not present

## 2016-05-06 DIAGNOSIS — D509 Iron deficiency anemia, unspecified: Secondary | ICD-10-CM | POA: Diagnosis not present

## 2016-05-06 DIAGNOSIS — N186 End stage renal disease: Secondary | ICD-10-CM | POA: Diagnosis not present

## 2016-05-06 DIAGNOSIS — N2581 Secondary hyperparathyroidism of renal origin: Secondary | ICD-10-CM | POA: Diagnosis not present

## 2016-05-09 DIAGNOSIS — N2581 Secondary hyperparathyroidism of renal origin: Secondary | ICD-10-CM | POA: Diagnosis not present

## 2016-05-09 DIAGNOSIS — D509 Iron deficiency anemia, unspecified: Secondary | ICD-10-CM | POA: Diagnosis not present

## 2016-05-09 DIAGNOSIS — N186 End stage renal disease: Secondary | ICD-10-CM | POA: Diagnosis not present

## 2016-05-10 DIAGNOSIS — Z992 Dependence on renal dialysis: Secondary | ICD-10-CM | POA: Diagnosis not present

## 2016-05-10 DIAGNOSIS — N2889 Other specified disorders of kidney and ureter: Secondary | ICD-10-CM | POA: Diagnosis not present

## 2016-05-10 DIAGNOSIS — N186 End stage renal disease: Secondary | ICD-10-CM | POA: Diagnosis not present

## 2016-05-11 DIAGNOSIS — N2581 Secondary hyperparathyroidism of renal origin: Secondary | ICD-10-CM | POA: Diagnosis not present

## 2016-05-11 DIAGNOSIS — N186 End stage renal disease: Secondary | ICD-10-CM | POA: Diagnosis not present

## 2016-05-11 DIAGNOSIS — D631 Anemia in chronic kidney disease: Secondary | ICD-10-CM | POA: Diagnosis not present

## 2016-05-11 DIAGNOSIS — D509 Iron deficiency anemia, unspecified: Secondary | ICD-10-CM | POA: Diagnosis not present

## 2016-05-13 DIAGNOSIS — N2581 Secondary hyperparathyroidism of renal origin: Secondary | ICD-10-CM | POA: Diagnosis not present

## 2016-05-13 DIAGNOSIS — D509 Iron deficiency anemia, unspecified: Secondary | ICD-10-CM | POA: Diagnosis not present

## 2016-05-13 DIAGNOSIS — D631 Anemia in chronic kidney disease: Secondary | ICD-10-CM | POA: Diagnosis not present

## 2016-05-13 DIAGNOSIS — N186 End stage renal disease: Secondary | ICD-10-CM | POA: Diagnosis not present

## 2016-05-16 DIAGNOSIS — D631 Anemia in chronic kidney disease: Secondary | ICD-10-CM | POA: Diagnosis not present

## 2016-05-16 DIAGNOSIS — D509 Iron deficiency anemia, unspecified: Secondary | ICD-10-CM | POA: Diagnosis not present

## 2016-05-16 DIAGNOSIS — N186 End stage renal disease: Secondary | ICD-10-CM | POA: Diagnosis not present

## 2016-05-16 DIAGNOSIS — N2581 Secondary hyperparathyroidism of renal origin: Secondary | ICD-10-CM | POA: Diagnosis not present

## 2016-05-18 DIAGNOSIS — D631 Anemia in chronic kidney disease: Secondary | ICD-10-CM | POA: Diagnosis not present

## 2016-05-18 DIAGNOSIS — N186 End stage renal disease: Secondary | ICD-10-CM | POA: Diagnosis not present

## 2016-05-18 DIAGNOSIS — N2581 Secondary hyperparathyroidism of renal origin: Secondary | ICD-10-CM | POA: Diagnosis not present

## 2016-05-18 DIAGNOSIS — D509 Iron deficiency anemia, unspecified: Secondary | ICD-10-CM | POA: Diagnosis not present

## 2016-05-20 DIAGNOSIS — N2581 Secondary hyperparathyroidism of renal origin: Secondary | ICD-10-CM | POA: Diagnosis not present

## 2016-05-20 DIAGNOSIS — D509 Iron deficiency anemia, unspecified: Secondary | ICD-10-CM | POA: Diagnosis not present

## 2016-05-20 DIAGNOSIS — D631 Anemia in chronic kidney disease: Secondary | ICD-10-CM | POA: Diagnosis not present

## 2016-05-20 DIAGNOSIS — N186 End stage renal disease: Secondary | ICD-10-CM | POA: Diagnosis not present

## 2016-05-23 DIAGNOSIS — D631 Anemia in chronic kidney disease: Secondary | ICD-10-CM | POA: Diagnosis not present

## 2016-05-23 DIAGNOSIS — N2581 Secondary hyperparathyroidism of renal origin: Secondary | ICD-10-CM | POA: Diagnosis not present

## 2016-05-23 DIAGNOSIS — D509 Iron deficiency anemia, unspecified: Secondary | ICD-10-CM | POA: Diagnosis not present

## 2016-05-23 DIAGNOSIS — N186 End stage renal disease: Secondary | ICD-10-CM | POA: Diagnosis not present

## 2016-05-25 DIAGNOSIS — D631 Anemia in chronic kidney disease: Secondary | ICD-10-CM | POA: Diagnosis not present

## 2016-05-25 DIAGNOSIS — N186 End stage renal disease: Secondary | ICD-10-CM | POA: Diagnosis not present

## 2016-05-25 DIAGNOSIS — N2581 Secondary hyperparathyroidism of renal origin: Secondary | ICD-10-CM | POA: Diagnosis not present

## 2016-05-25 DIAGNOSIS — D509 Iron deficiency anemia, unspecified: Secondary | ICD-10-CM | POA: Diagnosis not present

## 2016-05-29 DIAGNOSIS — N186 End stage renal disease: Secondary | ICD-10-CM | POA: Diagnosis not present

## 2016-05-29 DIAGNOSIS — N2581 Secondary hyperparathyroidism of renal origin: Secondary | ICD-10-CM | POA: Diagnosis not present

## 2016-05-29 DIAGNOSIS — D631 Anemia in chronic kidney disease: Secondary | ICD-10-CM | POA: Diagnosis not present

## 2016-05-29 DIAGNOSIS — D509 Iron deficiency anemia, unspecified: Secondary | ICD-10-CM | POA: Diagnosis not present

## 2016-06-03 DIAGNOSIS — D631 Anemia in chronic kidney disease: Secondary | ICD-10-CM | POA: Diagnosis not present

## 2016-06-03 DIAGNOSIS — D509 Iron deficiency anemia, unspecified: Secondary | ICD-10-CM | POA: Diagnosis not present

## 2016-06-03 DIAGNOSIS — N186 End stage renal disease: Secondary | ICD-10-CM | POA: Diagnosis not present

## 2016-06-03 DIAGNOSIS — N2581 Secondary hyperparathyroidism of renal origin: Secondary | ICD-10-CM | POA: Diagnosis not present

## 2016-06-06 DIAGNOSIS — D631 Anemia in chronic kidney disease: Secondary | ICD-10-CM | POA: Diagnosis not present

## 2016-06-06 DIAGNOSIS — N186 End stage renal disease: Secondary | ICD-10-CM | POA: Diagnosis not present

## 2016-06-06 DIAGNOSIS — D509 Iron deficiency anemia, unspecified: Secondary | ICD-10-CM | POA: Diagnosis not present

## 2016-06-06 DIAGNOSIS — N2581 Secondary hyperparathyroidism of renal origin: Secondary | ICD-10-CM | POA: Diagnosis not present

## 2016-06-08 DIAGNOSIS — D509 Iron deficiency anemia, unspecified: Secondary | ICD-10-CM | POA: Diagnosis not present

## 2016-06-08 DIAGNOSIS — N186 End stage renal disease: Secondary | ICD-10-CM | POA: Diagnosis not present

## 2016-06-08 DIAGNOSIS — N2581 Secondary hyperparathyroidism of renal origin: Secondary | ICD-10-CM | POA: Diagnosis not present

## 2016-06-08 DIAGNOSIS — D631 Anemia in chronic kidney disease: Secondary | ICD-10-CM | POA: Diagnosis not present

## 2016-06-09 DIAGNOSIS — Z992 Dependence on renal dialysis: Secondary | ICD-10-CM | POA: Diagnosis not present

## 2016-06-09 DIAGNOSIS — N186 End stage renal disease: Secondary | ICD-10-CM | POA: Diagnosis not present

## 2016-06-09 DIAGNOSIS — N2889 Other specified disorders of kidney and ureter: Secondary | ICD-10-CM | POA: Diagnosis not present

## 2016-06-10 DIAGNOSIS — N2581 Secondary hyperparathyroidism of renal origin: Secondary | ICD-10-CM | POA: Diagnosis not present

## 2016-06-10 DIAGNOSIS — D631 Anemia in chronic kidney disease: Secondary | ICD-10-CM | POA: Diagnosis not present

## 2016-06-10 DIAGNOSIS — D509 Iron deficiency anemia, unspecified: Secondary | ICD-10-CM | POA: Diagnosis not present

## 2016-06-10 DIAGNOSIS — N186 End stage renal disease: Secondary | ICD-10-CM | POA: Diagnosis not present

## 2016-06-15 DIAGNOSIS — N186 End stage renal disease: Secondary | ICD-10-CM | POA: Diagnosis not present

## 2016-06-15 DIAGNOSIS — N2581 Secondary hyperparathyroidism of renal origin: Secondary | ICD-10-CM | POA: Diagnosis not present

## 2016-06-15 DIAGNOSIS — D631 Anemia in chronic kidney disease: Secondary | ICD-10-CM | POA: Diagnosis not present

## 2016-06-15 DIAGNOSIS — D509 Iron deficiency anemia, unspecified: Secondary | ICD-10-CM | POA: Diagnosis not present

## 2016-06-17 DIAGNOSIS — D509 Iron deficiency anemia, unspecified: Secondary | ICD-10-CM | POA: Diagnosis not present

## 2016-06-17 DIAGNOSIS — D631 Anemia in chronic kidney disease: Secondary | ICD-10-CM | POA: Diagnosis not present

## 2016-06-17 DIAGNOSIS — N2581 Secondary hyperparathyroidism of renal origin: Secondary | ICD-10-CM | POA: Diagnosis not present

## 2016-06-17 DIAGNOSIS — N186 End stage renal disease: Secondary | ICD-10-CM | POA: Diagnosis not present

## 2016-06-20 DIAGNOSIS — N186 End stage renal disease: Secondary | ICD-10-CM | POA: Diagnosis not present

## 2016-06-20 DIAGNOSIS — D631 Anemia in chronic kidney disease: Secondary | ICD-10-CM | POA: Diagnosis not present

## 2016-06-20 DIAGNOSIS — D509 Iron deficiency anemia, unspecified: Secondary | ICD-10-CM | POA: Diagnosis not present

## 2016-06-20 DIAGNOSIS — N2581 Secondary hyperparathyroidism of renal origin: Secondary | ICD-10-CM | POA: Diagnosis not present

## 2016-06-22 DIAGNOSIS — N2581 Secondary hyperparathyroidism of renal origin: Secondary | ICD-10-CM | POA: Diagnosis not present

## 2016-06-22 DIAGNOSIS — D509 Iron deficiency anemia, unspecified: Secondary | ICD-10-CM | POA: Diagnosis not present

## 2016-06-22 DIAGNOSIS — N186 End stage renal disease: Secondary | ICD-10-CM | POA: Diagnosis not present

## 2016-06-22 DIAGNOSIS — D631 Anemia in chronic kidney disease: Secondary | ICD-10-CM | POA: Diagnosis not present

## 2016-06-24 DIAGNOSIS — D509 Iron deficiency anemia, unspecified: Secondary | ICD-10-CM | POA: Diagnosis not present

## 2016-06-24 DIAGNOSIS — N186 End stage renal disease: Secondary | ICD-10-CM | POA: Diagnosis not present

## 2016-06-24 DIAGNOSIS — D631 Anemia in chronic kidney disease: Secondary | ICD-10-CM | POA: Diagnosis not present

## 2016-06-24 DIAGNOSIS — N2581 Secondary hyperparathyroidism of renal origin: Secondary | ICD-10-CM | POA: Diagnosis not present

## 2016-06-27 DIAGNOSIS — D631 Anemia in chronic kidney disease: Secondary | ICD-10-CM | POA: Diagnosis not present

## 2016-06-27 DIAGNOSIS — D509 Iron deficiency anemia, unspecified: Secondary | ICD-10-CM | POA: Diagnosis not present

## 2016-06-27 DIAGNOSIS — N186 End stage renal disease: Secondary | ICD-10-CM | POA: Diagnosis not present

## 2016-06-27 DIAGNOSIS — N2581 Secondary hyperparathyroidism of renal origin: Secondary | ICD-10-CM | POA: Diagnosis not present

## 2016-06-29 DIAGNOSIS — N2581 Secondary hyperparathyroidism of renal origin: Secondary | ICD-10-CM | POA: Diagnosis not present

## 2016-06-29 DIAGNOSIS — D631 Anemia in chronic kidney disease: Secondary | ICD-10-CM | POA: Diagnosis not present

## 2016-06-29 DIAGNOSIS — D509 Iron deficiency anemia, unspecified: Secondary | ICD-10-CM | POA: Diagnosis not present

## 2016-06-29 DIAGNOSIS — N186 End stage renal disease: Secondary | ICD-10-CM | POA: Diagnosis not present

## 2016-07-01 DIAGNOSIS — N186 End stage renal disease: Secondary | ICD-10-CM | POA: Diagnosis not present

## 2016-07-01 DIAGNOSIS — D631 Anemia in chronic kidney disease: Secondary | ICD-10-CM | POA: Diagnosis not present

## 2016-07-01 DIAGNOSIS — N2581 Secondary hyperparathyroidism of renal origin: Secondary | ICD-10-CM | POA: Diagnosis not present

## 2016-07-01 DIAGNOSIS — D509 Iron deficiency anemia, unspecified: Secondary | ICD-10-CM | POA: Diagnosis not present

## 2016-07-03 DIAGNOSIS — D631 Anemia in chronic kidney disease: Secondary | ICD-10-CM | POA: Diagnosis not present

## 2016-07-03 DIAGNOSIS — D509 Iron deficiency anemia, unspecified: Secondary | ICD-10-CM | POA: Diagnosis not present

## 2016-07-03 DIAGNOSIS — N186 End stage renal disease: Secondary | ICD-10-CM | POA: Diagnosis not present

## 2016-07-03 DIAGNOSIS — N2581 Secondary hyperparathyroidism of renal origin: Secondary | ICD-10-CM | POA: Diagnosis not present

## 2016-07-06 DIAGNOSIS — N2581 Secondary hyperparathyroidism of renal origin: Secondary | ICD-10-CM | POA: Diagnosis not present

## 2016-07-06 DIAGNOSIS — D631 Anemia in chronic kidney disease: Secondary | ICD-10-CM | POA: Diagnosis not present

## 2016-07-06 DIAGNOSIS — D509 Iron deficiency anemia, unspecified: Secondary | ICD-10-CM | POA: Diagnosis not present

## 2016-07-06 DIAGNOSIS — N186 End stage renal disease: Secondary | ICD-10-CM | POA: Diagnosis not present

## 2016-07-10 DIAGNOSIS — N2581 Secondary hyperparathyroidism of renal origin: Secondary | ICD-10-CM | POA: Diagnosis not present

## 2016-07-10 DIAGNOSIS — Z992 Dependence on renal dialysis: Secondary | ICD-10-CM | POA: Diagnosis not present

## 2016-07-10 DIAGNOSIS — N2889 Other specified disorders of kidney and ureter: Secondary | ICD-10-CM | POA: Diagnosis not present

## 2016-07-10 DIAGNOSIS — N186 End stage renal disease: Secondary | ICD-10-CM | POA: Diagnosis not present

## 2016-07-10 DIAGNOSIS — D509 Iron deficiency anemia, unspecified: Secondary | ICD-10-CM | POA: Diagnosis not present

## 2016-07-10 DIAGNOSIS — D631 Anemia in chronic kidney disease: Secondary | ICD-10-CM | POA: Diagnosis not present

## 2016-07-13 DIAGNOSIS — D631 Anemia in chronic kidney disease: Secondary | ICD-10-CM | POA: Diagnosis not present

## 2016-07-13 DIAGNOSIS — D72818 Other decreased white blood cell count: Secondary | ICD-10-CM | POA: Diagnosis not present

## 2016-07-13 DIAGNOSIS — N2581 Secondary hyperparathyroidism of renal origin: Secondary | ICD-10-CM | POA: Diagnosis not present

## 2016-07-13 DIAGNOSIS — N186 End stage renal disease: Secondary | ICD-10-CM | POA: Diagnosis not present

## 2016-07-13 DIAGNOSIS — D509 Iron deficiency anemia, unspecified: Secondary | ICD-10-CM | POA: Diagnosis not present

## 2016-07-15 DIAGNOSIS — D509 Iron deficiency anemia, unspecified: Secondary | ICD-10-CM | POA: Diagnosis not present

## 2016-07-15 DIAGNOSIS — D72818 Other decreased white blood cell count: Secondary | ICD-10-CM | POA: Diagnosis not present

## 2016-07-15 DIAGNOSIS — D631 Anemia in chronic kidney disease: Secondary | ICD-10-CM | POA: Diagnosis not present

## 2016-07-15 DIAGNOSIS — N2581 Secondary hyperparathyroidism of renal origin: Secondary | ICD-10-CM | POA: Diagnosis not present

## 2016-07-15 DIAGNOSIS — N186 End stage renal disease: Secondary | ICD-10-CM | POA: Diagnosis not present

## 2016-07-18 DIAGNOSIS — D631 Anemia in chronic kidney disease: Secondary | ICD-10-CM | POA: Diagnosis not present

## 2016-07-18 DIAGNOSIS — D509 Iron deficiency anemia, unspecified: Secondary | ICD-10-CM | POA: Diagnosis not present

## 2016-07-18 DIAGNOSIS — N2581 Secondary hyperparathyroidism of renal origin: Secondary | ICD-10-CM | POA: Diagnosis not present

## 2016-07-18 DIAGNOSIS — N186 End stage renal disease: Secondary | ICD-10-CM | POA: Diagnosis not present

## 2016-07-18 DIAGNOSIS — D72818 Other decreased white blood cell count: Secondary | ICD-10-CM | POA: Diagnosis not present

## 2016-07-20 DIAGNOSIS — N2581 Secondary hyperparathyroidism of renal origin: Secondary | ICD-10-CM | POA: Diagnosis not present

## 2016-07-20 DIAGNOSIS — D509 Iron deficiency anemia, unspecified: Secondary | ICD-10-CM | POA: Diagnosis not present

## 2016-07-20 DIAGNOSIS — N186 End stage renal disease: Secondary | ICD-10-CM | POA: Diagnosis not present

## 2016-07-20 DIAGNOSIS — D631 Anemia in chronic kidney disease: Secondary | ICD-10-CM | POA: Diagnosis not present

## 2016-07-20 DIAGNOSIS — D72818 Other decreased white blood cell count: Secondary | ICD-10-CM | POA: Diagnosis not present

## 2016-07-22 DIAGNOSIS — N2581 Secondary hyperparathyroidism of renal origin: Secondary | ICD-10-CM | POA: Diagnosis not present

## 2016-07-22 DIAGNOSIS — N186 End stage renal disease: Secondary | ICD-10-CM | POA: Diagnosis not present

## 2016-07-22 DIAGNOSIS — D72818 Other decreased white blood cell count: Secondary | ICD-10-CM | POA: Diagnosis not present

## 2016-07-22 DIAGNOSIS — D509 Iron deficiency anemia, unspecified: Secondary | ICD-10-CM | POA: Diagnosis not present

## 2016-07-22 DIAGNOSIS — D631 Anemia in chronic kidney disease: Secondary | ICD-10-CM | POA: Diagnosis not present

## 2016-07-25 DIAGNOSIS — D631 Anemia in chronic kidney disease: Secondary | ICD-10-CM | POA: Diagnosis not present

## 2016-07-25 DIAGNOSIS — N2581 Secondary hyperparathyroidism of renal origin: Secondary | ICD-10-CM | POA: Diagnosis not present

## 2016-07-25 DIAGNOSIS — D509 Iron deficiency anemia, unspecified: Secondary | ICD-10-CM | POA: Diagnosis not present

## 2016-07-25 DIAGNOSIS — N186 End stage renal disease: Secondary | ICD-10-CM | POA: Diagnosis not present

## 2016-07-25 DIAGNOSIS — D72818 Other decreased white blood cell count: Secondary | ICD-10-CM | POA: Diagnosis not present

## 2016-08-01 DIAGNOSIS — N186 End stage renal disease: Secondary | ICD-10-CM | POA: Diagnosis not present

## 2016-08-01 DIAGNOSIS — N2581 Secondary hyperparathyroidism of renal origin: Secondary | ICD-10-CM | POA: Diagnosis not present

## 2016-08-01 DIAGNOSIS — D72818 Other decreased white blood cell count: Secondary | ICD-10-CM | POA: Diagnosis not present

## 2016-08-01 DIAGNOSIS — D631 Anemia in chronic kidney disease: Secondary | ICD-10-CM | POA: Diagnosis not present

## 2016-08-01 DIAGNOSIS — D509 Iron deficiency anemia, unspecified: Secondary | ICD-10-CM | POA: Diagnosis not present

## 2016-08-03 DIAGNOSIS — N186 End stage renal disease: Secondary | ICD-10-CM | POA: Diagnosis not present

## 2016-08-03 DIAGNOSIS — N2581 Secondary hyperparathyroidism of renal origin: Secondary | ICD-10-CM | POA: Diagnosis not present

## 2016-08-03 DIAGNOSIS — D631 Anemia in chronic kidney disease: Secondary | ICD-10-CM | POA: Diagnosis not present

## 2016-08-03 DIAGNOSIS — D509 Iron deficiency anemia, unspecified: Secondary | ICD-10-CM | POA: Diagnosis not present

## 2016-08-03 DIAGNOSIS — D72818 Other decreased white blood cell count: Secondary | ICD-10-CM | POA: Diagnosis not present

## 2016-08-05 DIAGNOSIS — N186 End stage renal disease: Secondary | ICD-10-CM | POA: Diagnosis not present

## 2016-08-05 DIAGNOSIS — N2581 Secondary hyperparathyroidism of renal origin: Secondary | ICD-10-CM | POA: Diagnosis not present

## 2016-08-05 DIAGNOSIS — D631 Anemia in chronic kidney disease: Secondary | ICD-10-CM | POA: Diagnosis not present

## 2016-08-05 DIAGNOSIS — D509 Iron deficiency anemia, unspecified: Secondary | ICD-10-CM | POA: Diagnosis not present

## 2016-08-05 DIAGNOSIS — D72818 Other decreased white blood cell count: Secondary | ICD-10-CM | POA: Diagnosis not present

## 2016-08-08 DIAGNOSIS — N2581 Secondary hyperparathyroidism of renal origin: Secondary | ICD-10-CM | POA: Diagnosis not present

## 2016-08-08 DIAGNOSIS — D72818 Other decreased white blood cell count: Secondary | ICD-10-CM | POA: Diagnosis not present

## 2016-08-08 DIAGNOSIS — N186 End stage renal disease: Secondary | ICD-10-CM | POA: Diagnosis not present

## 2016-08-08 DIAGNOSIS — D631 Anemia in chronic kidney disease: Secondary | ICD-10-CM | POA: Diagnosis not present

## 2016-08-08 DIAGNOSIS — D509 Iron deficiency anemia, unspecified: Secondary | ICD-10-CM | POA: Diagnosis not present

## 2016-08-10 DIAGNOSIS — D509 Iron deficiency anemia, unspecified: Secondary | ICD-10-CM | POA: Diagnosis not present

## 2016-08-10 DIAGNOSIS — N2889 Other specified disorders of kidney and ureter: Secondary | ICD-10-CM | POA: Diagnosis not present

## 2016-08-10 DIAGNOSIS — Z992 Dependence on renal dialysis: Secondary | ICD-10-CM | POA: Diagnosis not present

## 2016-08-10 DIAGNOSIS — D72818 Other decreased white blood cell count: Secondary | ICD-10-CM | POA: Diagnosis not present

## 2016-08-10 DIAGNOSIS — D631 Anemia in chronic kidney disease: Secondary | ICD-10-CM | POA: Diagnosis not present

## 2016-08-10 DIAGNOSIS — N186 End stage renal disease: Secondary | ICD-10-CM | POA: Diagnosis not present

## 2016-08-10 DIAGNOSIS — N2581 Secondary hyperparathyroidism of renal origin: Secondary | ICD-10-CM | POA: Diagnosis not present

## 2016-08-12 DIAGNOSIS — D631 Anemia in chronic kidney disease: Secondary | ICD-10-CM | POA: Diagnosis not present

## 2016-08-12 DIAGNOSIS — N186 End stage renal disease: Secondary | ICD-10-CM | POA: Diagnosis not present

## 2016-08-12 DIAGNOSIS — D509 Iron deficiency anemia, unspecified: Secondary | ICD-10-CM | POA: Diagnosis not present

## 2016-08-12 DIAGNOSIS — N2581 Secondary hyperparathyroidism of renal origin: Secondary | ICD-10-CM | POA: Diagnosis not present

## 2016-08-15 DIAGNOSIS — N186 End stage renal disease: Secondary | ICD-10-CM | POA: Diagnosis not present

## 2016-08-15 DIAGNOSIS — D631 Anemia in chronic kidney disease: Secondary | ICD-10-CM | POA: Diagnosis not present

## 2016-08-15 DIAGNOSIS — N2581 Secondary hyperparathyroidism of renal origin: Secondary | ICD-10-CM | POA: Diagnosis not present

## 2016-08-15 DIAGNOSIS — D509 Iron deficiency anemia, unspecified: Secondary | ICD-10-CM | POA: Diagnosis not present

## 2016-08-17 DIAGNOSIS — D509 Iron deficiency anemia, unspecified: Secondary | ICD-10-CM | POA: Diagnosis not present

## 2016-08-17 DIAGNOSIS — D631 Anemia in chronic kidney disease: Secondary | ICD-10-CM | POA: Diagnosis not present

## 2016-08-17 DIAGNOSIS — N2581 Secondary hyperparathyroidism of renal origin: Secondary | ICD-10-CM | POA: Diagnosis not present

## 2016-08-17 DIAGNOSIS — N186 End stage renal disease: Secondary | ICD-10-CM | POA: Diagnosis not present

## 2016-08-19 DIAGNOSIS — N2581 Secondary hyperparathyroidism of renal origin: Secondary | ICD-10-CM | POA: Diagnosis not present

## 2016-08-19 DIAGNOSIS — D631 Anemia in chronic kidney disease: Secondary | ICD-10-CM | POA: Diagnosis not present

## 2016-08-19 DIAGNOSIS — D509 Iron deficiency anemia, unspecified: Secondary | ICD-10-CM | POA: Diagnosis not present

## 2016-08-19 DIAGNOSIS — N186 End stage renal disease: Secondary | ICD-10-CM | POA: Diagnosis not present

## 2016-08-22 DIAGNOSIS — D509 Iron deficiency anemia, unspecified: Secondary | ICD-10-CM | POA: Diagnosis not present

## 2016-08-22 DIAGNOSIS — D631 Anemia in chronic kidney disease: Secondary | ICD-10-CM | POA: Diagnosis not present

## 2016-08-22 DIAGNOSIS — N186 End stage renal disease: Secondary | ICD-10-CM | POA: Diagnosis not present

## 2016-08-22 DIAGNOSIS — N2581 Secondary hyperparathyroidism of renal origin: Secondary | ICD-10-CM | POA: Diagnosis not present

## 2016-08-26 DIAGNOSIS — N186 End stage renal disease: Secondary | ICD-10-CM | POA: Diagnosis not present

## 2016-08-26 DIAGNOSIS — R768 Other specified abnormal immunological findings in serum: Secondary | ICD-10-CM | POA: Diagnosis not present

## 2016-08-26 DIAGNOSIS — D631 Anemia in chronic kidney disease: Secondary | ICD-10-CM | POA: Diagnosis not present

## 2016-08-26 DIAGNOSIS — N2581 Secondary hyperparathyroidism of renal origin: Secondary | ICD-10-CM | POA: Diagnosis not present

## 2016-08-26 DIAGNOSIS — D509 Iron deficiency anemia, unspecified: Secondary | ICD-10-CM | POA: Diagnosis not present

## 2016-08-29 DIAGNOSIS — D631 Anemia in chronic kidney disease: Secondary | ICD-10-CM | POA: Diagnosis not present

## 2016-08-29 DIAGNOSIS — D509 Iron deficiency anemia, unspecified: Secondary | ICD-10-CM | POA: Diagnosis not present

## 2016-08-29 DIAGNOSIS — N186 End stage renal disease: Secondary | ICD-10-CM | POA: Diagnosis not present

## 2016-08-29 DIAGNOSIS — N2581 Secondary hyperparathyroidism of renal origin: Secondary | ICD-10-CM | POA: Diagnosis not present

## 2016-08-31 DIAGNOSIS — N186 End stage renal disease: Secondary | ICD-10-CM | POA: Diagnosis not present

## 2016-08-31 DIAGNOSIS — N2581 Secondary hyperparathyroidism of renal origin: Secondary | ICD-10-CM | POA: Diagnosis not present

## 2016-08-31 DIAGNOSIS — D509 Iron deficiency anemia, unspecified: Secondary | ICD-10-CM | POA: Diagnosis not present

## 2016-08-31 DIAGNOSIS — D631 Anemia in chronic kidney disease: Secondary | ICD-10-CM | POA: Diagnosis not present

## 2016-09-02 DIAGNOSIS — N2581 Secondary hyperparathyroidism of renal origin: Secondary | ICD-10-CM | POA: Diagnosis not present

## 2016-09-02 DIAGNOSIS — D631 Anemia in chronic kidney disease: Secondary | ICD-10-CM | POA: Diagnosis not present

## 2016-09-02 DIAGNOSIS — D509 Iron deficiency anemia, unspecified: Secondary | ICD-10-CM | POA: Diagnosis not present

## 2016-09-02 DIAGNOSIS — N186 End stage renal disease: Secondary | ICD-10-CM | POA: Diagnosis not present

## 2016-09-05 DIAGNOSIS — N186 End stage renal disease: Secondary | ICD-10-CM | POA: Diagnosis not present

## 2016-09-05 DIAGNOSIS — D509 Iron deficiency anemia, unspecified: Secondary | ICD-10-CM | POA: Diagnosis not present

## 2016-09-05 DIAGNOSIS — N2581 Secondary hyperparathyroidism of renal origin: Secondary | ICD-10-CM | POA: Diagnosis not present

## 2016-09-05 DIAGNOSIS — D631 Anemia in chronic kidney disease: Secondary | ICD-10-CM | POA: Diagnosis not present

## 2016-09-07 DIAGNOSIS — D631 Anemia in chronic kidney disease: Secondary | ICD-10-CM | POA: Diagnosis not present

## 2016-09-07 DIAGNOSIS — Z992 Dependence on renal dialysis: Secondary | ICD-10-CM | POA: Diagnosis not present

## 2016-09-07 DIAGNOSIS — N2889 Other specified disorders of kidney and ureter: Secondary | ICD-10-CM | POA: Diagnosis not present

## 2016-09-07 DIAGNOSIS — N186 End stage renal disease: Secondary | ICD-10-CM | POA: Diagnosis not present

## 2016-09-07 DIAGNOSIS — D509 Iron deficiency anemia, unspecified: Secondary | ICD-10-CM | POA: Diagnosis not present

## 2016-09-07 DIAGNOSIS — N2581 Secondary hyperparathyroidism of renal origin: Secondary | ICD-10-CM | POA: Diagnosis not present

## 2016-09-09 DIAGNOSIS — N2581 Secondary hyperparathyroidism of renal origin: Secondary | ICD-10-CM | POA: Diagnosis not present

## 2016-09-09 DIAGNOSIS — N186 End stage renal disease: Secondary | ICD-10-CM | POA: Diagnosis not present

## 2016-09-09 DIAGNOSIS — D509 Iron deficiency anemia, unspecified: Secondary | ICD-10-CM | POA: Diagnosis not present

## 2016-09-12 DIAGNOSIS — D509 Iron deficiency anemia, unspecified: Secondary | ICD-10-CM | POA: Diagnosis not present

## 2016-09-12 DIAGNOSIS — N2581 Secondary hyperparathyroidism of renal origin: Secondary | ICD-10-CM | POA: Diagnosis not present

## 2016-09-12 DIAGNOSIS — N186 End stage renal disease: Secondary | ICD-10-CM | POA: Diagnosis not present

## 2016-09-14 DIAGNOSIS — D509 Iron deficiency anemia, unspecified: Secondary | ICD-10-CM | POA: Diagnosis not present

## 2016-09-14 DIAGNOSIS — N2581 Secondary hyperparathyroidism of renal origin: Secondary | ICD-10-CM | POA: Diagnosis not present

## 2016-09-14 DIAGNOSIS — N186 End stage renal disease: Secondary | ICD-10-CM | POA: Diagnosis not present

## 2016-09-16 DIAGNOSIS — N2581 Secondary hyperparathyroidism of renal origin: Secondary | ICD-10-CM | POA: Diagnosis not present

## 2016-09-16 DIAGNOSIS — N186 End stage renal disease: Secondary | ICD-10-CM | POA: Diagnosis not present

## 2016-09-16 DIAGNOSIS — D509 Iron deficiency anemia, unspecified: Secondary | ICD-10-CM | POA: Diagnosis not present

## 2016-09-19 DIAGNOSIS — D509 Iron deficiency anemia, unspecified: Secondary | ICD-10-CM | POA: Diagnosis not present

## 2016-09-19 DIAGNOSIS — N2581 Secondary hyperparathyroidism of renal origin: Secondary | ICD-10-CM | POA: Diagnosis not present

## 2016-09-19 DIAGNOSIS — N186 End stage renal disease: Secondary | ICD-10-CM | POA: Diagnosis not present

## 2016-09-21 DIAGNOSIS — N2581 Secondary hyperparathyroidism of renal origin: Secondary | ICD-10-CM | POA: Diagnosis not present

## 2016-09-21 DIAGNOSIS — D509 Iron deficiency anemia, unspecified: Secondary | ICD-10-CM | POA: Diagnosis not present

## 2016-09-21 DIAGNOSIS — N186 End stage renal disease: Secondary | ICD-10-CM | POA: Diagnosis not present

## 2016-09-23 DIAGNOSIS — N186 End stage renal disease: Secondary | ICD-10-CM | POA: Diagnosis not present

## 2016-09-23 DIAGNOSIS — D509 Iron deficiency anemia, unspecified: Secondary | ICD-10-CM | POA: Diagnosis not present

## 2016-09-23 DIAGNOSIS — N2581 Secondary hyperparathyroidism of renal origin: Secondary | ICD-10-CM | POA: Diagnosis not present

## 2016-09-28 DIAGNOSIS — D509 Iron deficiency anemia, unspecified: Secondary | ICD-10-CM | POA: Diagnosis not present

## 2016-09-28 DIAGNOSIS — N2581 Secondary hyperparathyroidism of renal origin: Secondary | ICD-10-CM | POA: Diagnosis not present

## 2016-09-28 DIAGNOSIS — N186 End stage renal disease: Secondary | ICD-10-CM | POA: Diagnosis not present

## 2016-09-30 DIAGNOSIS — N186 End stage renal disease: Secondary | ICD-10-CM | POA: Diagnosis not present

## 2016-09-30 DIAGNOSIS — N2581 Secondary hyperparathyroidism of renal origin: Secondary | ICD-10-CM | POA: Diagnosis not present

## 2016-09-30 DIAGNOSIS — D509 Iron deficiency anemia, unspecified: Secondary | ICD-10-CM | POA: Diagnosis not present

## 2016-10-03 DIAGNOSIS — D509 Iron deficiency anemia, unspecified: Secondary | ICD-10-CM | POA: Diagnosis not present

## 2016-10-03 DIAGNOSIS — N2581 Secondary hyperparathyroidism of renal origin: Secondary | ICD-10-CM | POA: Diagnosis not present

## 2016-10-03 DIAGNOSIS — N186 End stage renal disease: Secondary | ICD-10-CM | POA: Diagnosis not present

## 2016-10-07 DIAGNOSIS — N2581 Secondary hyperparathyroidism of renal origin: Secondary | ICD-10-CM | POA: Diagnosis not present

## 2016-10-07 DIAGNOSIS — D509 Iron deficiency anemia, unspecified: Secondary | ICD-10-CM | POA: Diagnosis not present

## 2016-10-07 DIAGNOSIS — N186 End stage renal disease: Secondary | ICD-10-CM | POA: Diagnosis not present

## 2016-10-08 DIAGNOSIS — N186 End stage renal disease: Secondary | ICD-10-CM | POA: Diagnosis not present

## 2016-10-08 DIAGNOSIS — Z992 Dependence on renal dialysis: Secondary | ICD-10-CM | POA: Diagnosis not present

## 2016-10-08 DIAGNOSIS — N2889 Other specified disorders of kidney and ureter: Secondary | ICD-10-CM | POA: Diagnosis not present

## 2016-10-10 DIAGNOSIS — N186 End stage renal disease: Secondary | ICD-10-CM | POA: Diagnosis not present

## 2016-10-10 DIAGNOSIS — N2581 Secondary hyperparathyroidism of renal origin: Secondary | ICD-10-CM | POA: Diagnosis not present

## 2016-10-11 DIAGNOSIS — M79602 Pain in left arm: Secondary | ICD-10-CM | POA: Diagnosis not present

## 2016-10-11 DIAGNOSIS — R52 Pain, unspecified: Secondary | ICD-10-CM | POA: Diagnosis not present

## 2016-10-12 ENCOUNTER — Emergency Department (HOSPITAL_COMMUNITY): Payer: Medicare Other

## 2016-10-12 ENCOUNTER — Emergency Department (HOSPITAL_COMMUNITY)
Admission: EM | Admit: 2016-10-12 | Discharge: 2016-10-12 | Disposition: A | Discharge status: Home / Self Care | Payer: Medicare Other | Attending: Emergency Medicine | Admitting: Emergency Medicine

## 2016-10-12 DIAGNOSIS — N186 End stage renal disease: Secondary | ICD-10-CM | POA: Diagnosis not present

## 2016-10-12 DIAGNOSIS — Z79899 Other long term (current) drug therapy: Secondary | ICD-10-CM | POA: Diagnosis not present

## 2016-10-12 DIAGNOSIS — I120 Hypertensive chronic kidney disease with stage 5 chronic kidney disease or end stage renal disease: Secondary | ICD-10-CM | POA: Diagnosis not present

## 2016-10-12 DIAGNOSIS — Z87891 Personal history of nicotine dependence: Secondary | ICD-10-CM | POA: Insufficient documentation

## 2016-10-12 DIAGNOSIS — Z992 Dependence on renal dialysis: Secondary | ICD-10-CM | POA: Insufficient documentation

## 2016-10-12 DIAGNOSIS — S6992XA Unspecified injury of left wrist, hand and finger(s), initial encounter: Secondary | ICD-10-CM | POA: Diagnosis present

## 2016-10-12 DIAGNOSIS — T7411XA Adult physical abuse, confirmed, initial encounter: Secondary | ICD-10-CM | POA: Diagnosis not present

## 2016-10-12 DIAGNOSIS — Y999 Unspecified external cause status: Secondary | ICD-10-CM | POA: Insufficient documentation

## 2016-10-12 DIAGNOSIS — Y929 Unspecified place or not applicable: Secondary | ICD-10-CM | POA: Insufficient documentation

## 2016-10-12 DIAGNOSIS — S52502A Unspecified fracture of the lower end of left radius, initial encounter for closed fracture: Secondary | ICD-10-CM | POA: Diagnosis not present

## 2016-10-12 DIAGNOSIS — S52552A Other extraarticular fracture of lower end of left radius, initial encounter for closed fracture: Secondary | ICD-10-CM

## 2016-10-12 DIAGNOSIS — Y939 Activity, unspecified: Secondary | ICD-10-CM | POA: Insufficient documentation
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
## 2016-10-12 MED ORDER — ACETAMINOPHEN 325 MG PO TABS
650.0000 mg | ORAL_TABLET | Freq: Once | ORAL | Status: AC
  Administered 2016-10-12: 650 mg via ORAL

## 2016-10-12 MED ORDER — OXYCODONE-ACETAMINOPHEN 5-325 MG PO TABS: 2.0000 | ORAL_TABLET | ORAL | 0 refills | Status: DC | PRN

## 2016-10-12 MED ORDER — ACETAMINOPHEN 325 MG PO TABS
ORAL_TABLET | ORAL | Status: AC
  Filled 2016-10-12: qty 2

## 2016-10-12 MED ORDER — OXYCODONE-ACETAMINOPHEN 5-325 MG PO TABS
1.0000 | ORAL_TABLET | Freq: Once | ORAL | Status: AC
  Administered 2016-10-12: 1 via ORAL
  Filled 2016-10-12: qty 1
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
## 2016-10-12 NOTE — Progress Notes (Signed)
Orthopedic Tech Progress Note Patient Details:  Raymond [name] December 25, 1955 [id number]  Ortho Devices Type of Ortho Device: Arm sling, Ulna gutter splint Ortho Device/Splint Location: lue Ortho Device/Splint Interventions: Ordered, Application   Karolee Stamps 10/12/2016, 5:32 AM

## 2016-10-12 NOTE — ED Notes (Signed)
Ortho tech en route to ED. 

## 2016-11-07 DIAGNOSIS — N2889 Other specified disorders of kidney and ureter: Secondary | ICD-10-CM | POA: Diagnosis not present

## 2016-11-07 DIAGNOSIS — N2581 Secondary hyperparathyroidism of renal origin: Secondary | ICD-10-CM | POA: Diagnosis not present

## 2016-11-07 DIAGNOSIS — Z992 Dependence on renal dialysis: Secondary | ICD-10-CM | POA: Diagnosis not present

## 2016-11-07 DIAGNOSIS — N186 End stage renal disease: Secondary | ICD-10-CM | POA: Diagnosis not present

## 2016-11-09 DIAGNOSIS — N186 End stage renal disease: Secondary | ICD-10-CM | POA: Diagnosis not present

## 2016-11-09 DIAGNOSIS — N2581 Secondary hyperparathyroidism of renal origin: Secondary | ICD-10-CM | POA: Diagnosis not present

## 2016-11-09 DIAGNOSIS — D631 Anemia in chronic kidney disease: Secondary | ICD-10-CM | POA: Diagnosis not present

## 2016-11-09 DIAGNOSIS — D509 Iron deficiency anemia, unspecified: Secondary | ICD-10-CM | POA: Diagnosis not present

## 2016-11-14 DIAGNOSIS — D631 Anemia in chronic kidney disease: Secondary | ICD-10-CM | POA: Diagnosis not present

## 2016-11-14 DIAGNOSIS — D509 Iron deficiency anemia, unspecified: Secondary | ICD-10-CM | POA: Diagnosis not present

## 2016-11-14 DIAGNOSIS — N186 End stage renal disease: Secondary | ICD-10-CM | POA: Diagnosis not present

## 2016-11-14 DIAGNOSIS — N2581 Secondary hyperparathyroidism of renal origin: Secondary | ICD-10-CM | POA: Diagnosis not present

## 2016-11-16 DIAGNOSIS — D509 Iron deficiency anemia, unspecified: Secondary | ICD-10-CM | POA: Diagnosis not present

## 2016-11-16 DIAGNOSIS — N186 End stage renal disease: Secondary | ICD-10-CM | POA: Diagnosis not present

## 2016-11-16 DIAGNOSIS — D631 Anemia in chronic kidney disease: Secondary | ICD-10-CM | POA: Diagnosis not present

## 2016-11-16 DIAGNOSIS — N2581 Secondary hyperparathyroidism of renal origin: Secondary | ICD-10-CM | POA: Diagnosis not present

## 2016-11-18 DIAGNOSIS — D509 Iron deficiency anemia, unspecified: Secondary | ICD-10-CM | POA: Diagnosis not present

## 2016-11-18 DIAGNOSIS — D631 Anemia in chronic kidney disease: Secondary | ICD-10-CM | POA: Diagnosis not present

## 2016-11-18 DIAGNOSIS — N2581 Secondary hyperparathyroidism of renal origin: Secondary | ICD-10-CM | POA: Diagnosis not present

## 2016-11-18 DIAGNOSIS — N186 End stage renal disease: Secondary | ICD-10-CM | POA: Diagnosis not present

## 2016-11-21 DIAGNOSIS — N2581 Secondary hyperparathyroidism of renal origin: Secondary | ICD-10-CM | POA: Diagnosis not present

## 2016-11-21 DIAGNOSIS — D631 Anemia in chronic kidney disease: Secondary | ICD-10-CM | POA: Diagnosis not present

## 2016-11-21 DIAGNOSIS — D509 Iron deficiency anemia, unspecified: Secondary | ICD-10-CM | POA: Diagnosis not present

## 2016-11-21 DIAGNOSIS — N186 End stage renal disease: Secondary | ICD-10-CM | POA: Diagnosis not present

## 2016-11-23 DIAGNOSIS — D509 Iron deficiency anemia, unspecified: Secondary | ICD-10-CM | POA: Diagnosis not present

## 2016-11-23 DIAGNOSIS — N2581 Secondary hyperparathyroidism of renal origin: Secondary | ICD-10-CM | POA: Diagnosis not present

## 2016-11-23 DIAGNOSIS — N186 End stage renal disease: Secondary | ICD-10-CM | POA: Diagnosis not present

## 2016-11-23 DIAGNOSIS — D631 Anemia in chronic kidney disease: Secondary | ICD-10-CM | POA: Diagnosis not present

## 2016-11-25 DIAGNOSIS — N186 End stage renal disease: Secondary | ICD-10-CM | POA: Diagnosis not present

## 2016-11-25 DIAGNOSIS — D509 Iron deficiency anemia, unspecified: Secondary | ICD-10-CM | POA: Diagnosis not present

## 2016-11-25 DIAGNOSIS — N2581 Secondary hyperparathyroidism of renal origin: Secondary | ICD-10-CM | POA: Diagnosis not present

## 2016-11-25 DIAGNOSIS — D631 Anemia in chronic kidney disease: Secondary | ICD-10-CM | POA: Diagnosis not present

## 2016-11-28 DIAGNOSIS — D631 Anemia in chronic kidney disease: Secondary | ICD-10-CM | POA: Diagnosis not present

## 2016-11-28 DIAGNOSIS — N186 End stage renal disease: Secondary | ICD-10-CM | POA: Diagnosis not present

## 2016-11-28 DIAGNOSIS — N2581 Secondary hyperparathyroidism of renal origin: Secondary | ICD-10-CM | POA: Diagnosis not present

## 2016-11-28 DIAGNOSIS — D509 Iron deficiency anemia, unspecified: Secondary | ICD-10-CM | POA: Diagnosis not present

## 2016-11-30 DIAGNOSIS — D509 Iron deficiency anemia, unspecified: Secondary | ICD-10-CM | POA: Diagnosis not present

## 2016-11-30 DIAGNOSIS — D631 Anemia in chronic kidney disease: Secondary | ICD-10-CM | POA: Diagnosis not present

## 2016-11-30 DIAGNOSIS — N2581 Secondary hyperparathyroidism of renal origin: Secondary | ICD-10-CM | POA: Diagnosis not present

## 2016-11-30 DIAGNOSIS — N186 End stage renal disease: Secondary | ICD-10-CM | POA: Diagnosis not present

## 2016-12-02 DIAGNOSIS — N2581 Secondary hyperparathyroidism of renal origin: Secondary | ICD-10-CM | POA: Diagnosis not present

## 2016-12-02 DIAGNOSIS — D509 Iron deficiency anemia, unspecified: Secondary | ICD-10-CM | POA: Diagnosis not present

## 2016-12-02 DIAGNOSIS — D631 Anemia in chronic kidney disease: Secondary | ICD-10-CM | POA: Diagnosis not present

## 2016-12-02 DIAGNOSIS — N186 End stage renal disease: Secondary | ICD-10-CM | POA: Diagnosis not present

## 2016-12-05 ENCOUNTER — Observation Stay (HOSPITAL_COMMUNITY)
Admission: EM | Admit: 2016-12-05 | Discharge: 2016-12-06 | Disposition: A | Discharge status: Home / Self Care | Payer: Medicare Other | Attending: Oncology | Admitting: Oncology

## 2016-12-05 ENCOUNTER — Emergency Department (HOSPITAL_COMMUNITY): Payer: Medicare Other

## 2016-12-05 ENCOUNTER — Encounter (HOSPITAL_COMMUNITY): Payer: Self-pay | Admitting: Emergency Medicine

## 2016-12-05 ENCOUNTER — Observation Stay (HOSPITAL_COMMUNITY): Payer: Medicare Other

## 2016-12-05 DIAGNOSIS — Z79899 Other long term (current) drug therapy: Secondary | ICD-10-CM | POA: Diagnosis not present

## 2016-12-05 DIAGNOSIS — B1920 Unspecified viral hepatitis C without hepatic coma: Secondary | ICD-10-CM | POA: Diagnosis not present

## 2016-12-05 DIAGNOSIS — E785 Hyperlipidemia, unspecified: Secondary | ICD-10-CM | POA: Insufficient documentation

## 2016-12-05 DIAGNOSIS — J440 Chronic obstructive pulmonary disease with acute lower respiratory infection: Principal | ICD-10-CM | POA: Insufficient documentation

## 2016-12-05 DIAGNOSIS — J209 Acute bronchitis, unspecified: Secondary | ICD-10-CM

## 2016-12-05 DIAGNOSIS — R0602 Shortness of breath: Secondary | ICD-10-CM | POA: Diagnosis not present

## 2016-12-05 DIAGNOSIS — Z8249 Family history of ischemic heart disease and other diseases of the circulatory system: Secondary | ICD-10-CM | POA: Diagnosis not present

## 2016-12-05 DIAGNOSIS — Z833 Family history of diabetes mellitus: Secondary | ICD-10-CM | POA: Insufficient documentation

## 2016-12-05 DIAGNOSIS — N185 Chronic kidney disease, stage 5: Secondary | ICD-10-CM

## 2016-12-05 DIAGNOSIS — R1031 Right lower quadrant pain: Secondary | ICD-10-CM | POA: Diagnosis not present

## 2016-12-05 DIAGNOSIS — J441 Chronic obstructive pulmonary disease with (acute) exacerbation: Secondary | ICD-10-CM | POA: Diagnosis not present

## 2016-12-05 DIAGNOSIS — Z992 Dependence on renal dialysis: Secondary | ICD-10-CM | POA: Diagnosis not present

## 2016-12-05 DIAGNOSIS — I77819 Aortic ectasia, unspecified site: Secondary | ICD-10-CM | POA: Insufficient documentation

## 2016-12-05 DIAGNOSIS — E8889 Other specified metabolic disorders: Secondary | ICD-10-CM | POA: Diagnosis not present

## 2016-12-05 DIAGNOSIS — F1411 Cocaine abuse, in remission: Secondary | ICD-10-CM | POA: Diagnosis not present

## 2016-12-05 DIAGNOSIS — N186 End stage renal disease: Secondary | ICD-10-CM | POA: Diagnosis not present

## 2016-12-05 DIAGNOSIS — I700 Atherosclerosis of aorta: Secondary | ICD-10-CM | POA: Diagnosis not present

## 2016-12-05 DIAGNOSIS — I071 Rheumatic tricuspid insufficiency: Secondary | ICD-10-CM | POA: Insufficient documentation

## 2016-12-05 DIAGNOSIS — R0902 Hypoxemia: Secondary | ICD-10-CM | POA: Diagnosis not present

## 2016-12-05 DIAGNOSIS — D631 Anemia in chronic kidney disease: Secondary | ICD-10-CM | POA: Diagnosis not present

## 2016-12-05 DIAGNOSIS — R079 Chest pain, unspecified: Secondary | ICD-10-CM | POA: Diagnosis not present

## 2016-12-05 DIAGNOSIS — F1011 Alcohol abuse, in remission: Secondary | ICD-10-CM | POA: Insufficient documentation

## 2016-12-05 DIAGNOSIS — I2510 Atherosclerotic heart disease of native coronary artery without angina pectoris: Secondary | ICD-10-CM | POA: Insufficient documentation

## 2016-12-05 DIAGNOSIS — I120 Hypertensive chronic kidney disease with stage 5 chronic kidney disease or end stage renal disease: Secondary | ICD-10-CM | POA: Insufficient documentation

## 2016-12-05 DIAGNOSIS — Z87891 Personal history of nicotine dependence: Secondary | ICD-10-CM | POA: Insufficient documentation

## 2016-12-05 DIAGNOSIS — J449 Chronic obstructive pulmonary disease, unspecified: Secondary | ICD-10-CM

## 2016-12-05 DIAGNOSIS — R05 Cough: Secondary | ICD-10-CM | POA: Diagnosis not present

## 2016-12-05 HISTORY — DX: Dependence on renal dialysis: Z99.2

## 2016-12-05 HISTORY — DX: End stage renal disease: N18.6

## 2016-12-05 HISTORY — DX: Personal history of other medical treatment: Z92.89

## 2016-12-05 LAB — COMPREHENSIVE METABOLIC PANEL
ALBUMIN: 3.2 g/dL — AB (ref 3.5–5.0)
ALT: 10 U/L — ABNORMAL LOW (ref 17–63)
ANION GAP: 12 (ref 5–15)
AST: 15 U/L (ref 15–41)
Alkaline Phosphatase: 73 U/L (ref 38–126)
BUN: 49 mg/dL — AB (ref 6–20)
CO2: 28 mmol/L (ref 22–32)
Calcium: 8 mg/dL — ABNORMAL LOW (ref 8.9–10.3)
Chloride: 101 mmol/L (ref 101–111)
Creatinine, Ser: 13.79 mg/dL — ABNORMAL HIGH (ref 0.61–1.24)
GFR calc Af Amer: 4 mL/min — ABNORMAL LOW (ref >60)
GFR calc non Af Amer: 3 mL/min — ABNORMAL LOW (ref >60)
GLUCOSE: 85 mg/dL (ref 65–99)
POTASSIUM: 5.2 mmol/L — AB (ref 3.5–5.1)
SODIUM: 141 mmol/L (ref 135–145)
Total Bilirubin: 0.5 mg/dL (ref 0.3–1.2)
Total Protein: 7.7 g/dL (ref 6.5–8.1)

## 2016-12-05 LAB — CBC WITH DIFFERENTIAL/PLATELET
BASOS ABS: 0 10*3/uL (ref 0.0–0.1)
Basophils Relative: 0 %
EOS ABS: 0.1 10*3/uL (ref 0.0–0.7)
EOS PCT: 2 %
HCT: 31.5 % — ABNORMAL LOW (ref 39.0–52.0)
HEMOGLOBIN: 9.4 g/dL — AB (ref 13.0–17.0)
LYMPHS ABS: 2.1 10*3/uL (ref 0.7–4.0)
LYMPHS PCT: 45 %
MCH: 30.8 pg (ref 26.0–34.0)
MCHC: 29.8 g/dL — ABNORMAL LOW (ref 30.0–36.0)
MCV: 103.3 fL — AB (ref 78.0–100.0)
Monocytes Absolute: 0.4 10*3/uL (ref 0.1–1.0)
Monocytes Relative: 8 %
NEUTROS PCT: 45 %
Neutro Abs: 2.1 10*3/uL (ref 1.7–7.7)
PLATELETS: 133 10*3/uL — AB (ref 150–400)
RBC: 3.05 MIL/uL — AB (ref 4.22–5.81)
RDW: 17.6 % — ABNORMAL HIGH (ref 11.5–15.5)
WBC: 4.7 10*3/uL (ref 4.0–10.5)

## 2016-12-05 LAB — TROPONIN I: Troponin I: <0.03 ng/mL (ref <0.03)

## 2016-12-05 LAB — BRAIN NATRIURETIC PEPTIDE: B NATRIURETIC PEPTIDE 5: 1215.6 pg/mL — AB (ref 0.0–100.0)

## 2016-12-05 LAB — MRSA PCR SCREENING: MRSA by PCR: NEGATIVE

## 2016-12-05 IMAGING — CT CT ANGIO CHEST
2 of 6 series · 18 of 36 positions shown · IV contrast (isovue)
Comparison: Chest radiographs today.  Abdominal CT [DATE].

CLINICAL DATA: Shortness of breath with chest pain and congestion.
On hemodialysis for 5 years.

EXAM:
CT ANGIOGRAPHY CHEST WITH CONTRAST
TECHNIQUE: Multidetector CT imaging of the chest was performed using the
standard protocol during bolus administration of intravenous
contrast. Multiplanar CT image reconstructions and MIPs were
obtained to evaluate the vascular anatomy.
CONTRAST:  100 ml Isovue 370.

[Series 7: pe thins · axial · 0.68mm/px · z∈[+1020,+1270]mm · 17 of 283 slices shown]
[im 16/283  lung]
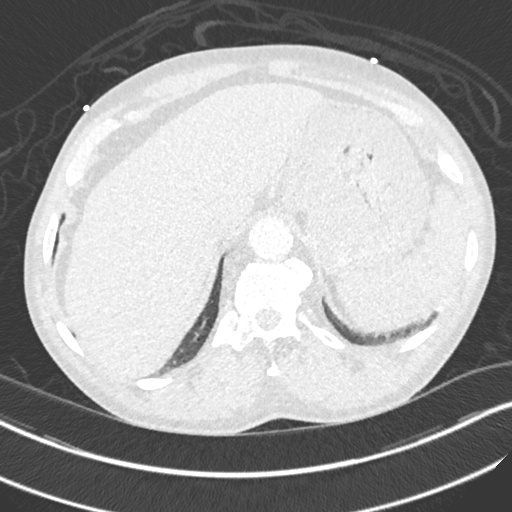
[im 32/283  mediastinal]
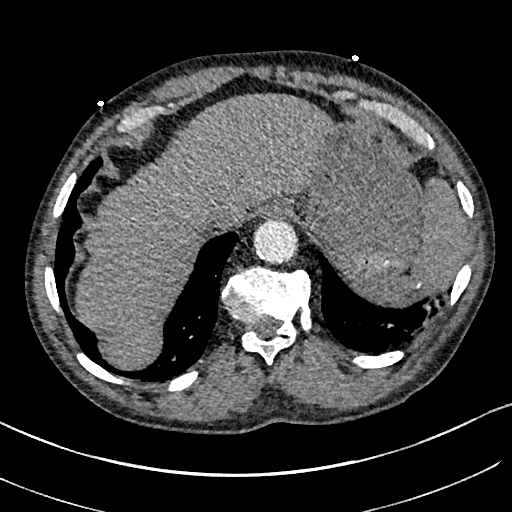
[im 48/283  lung]
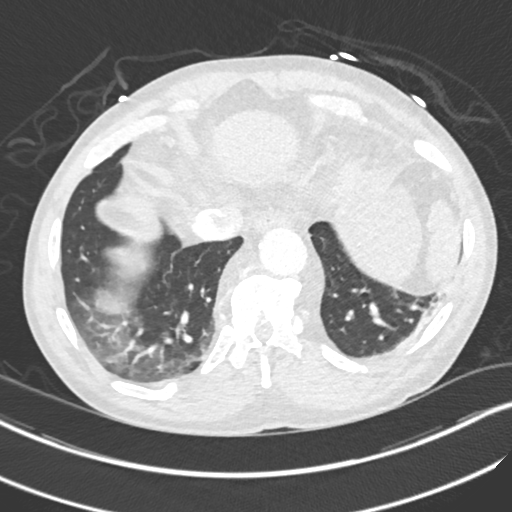
[im 63/283  mediastinal]
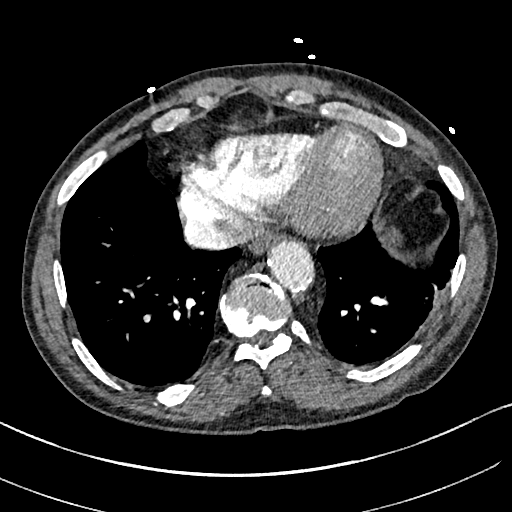
[im 79/283  lung]
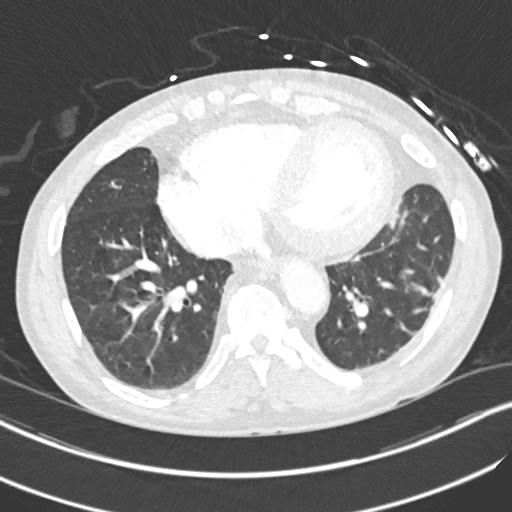
[im 95/283  mediastinal]
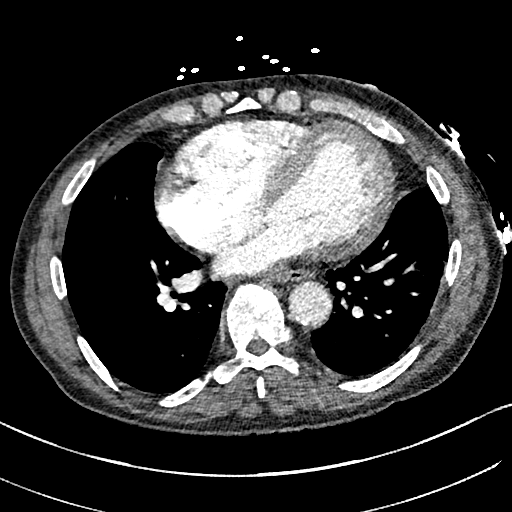
[im 110/283  lung]
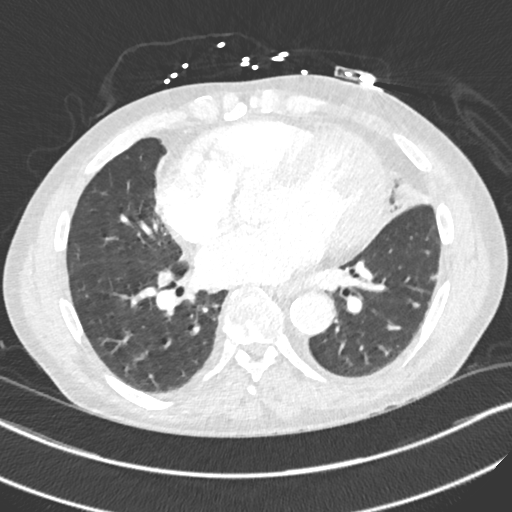
[im 126/283  mediastinal]
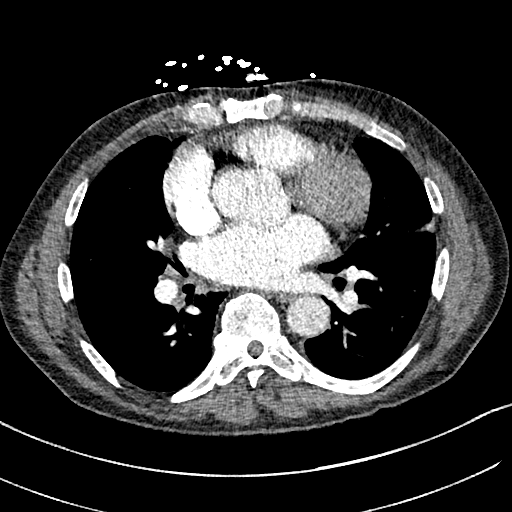
[im 142/283  lung]
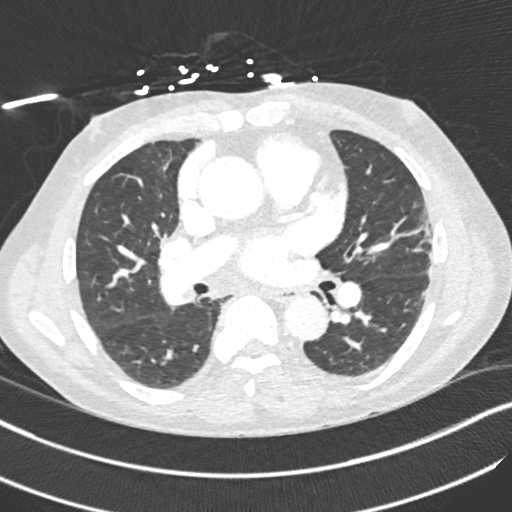
[im 157/283  mediastinal]
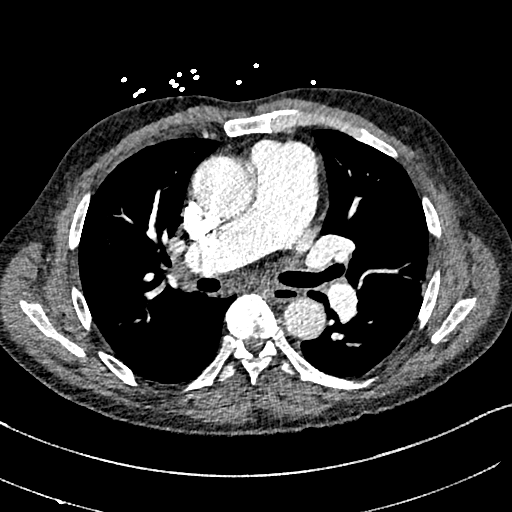
[im 173/283  lung]
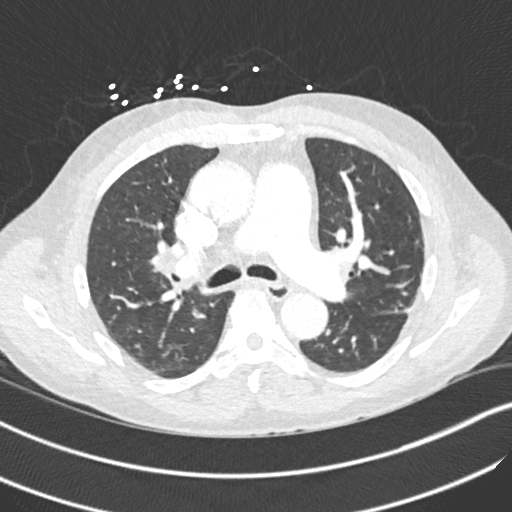
[im 189/283  mediastinal]
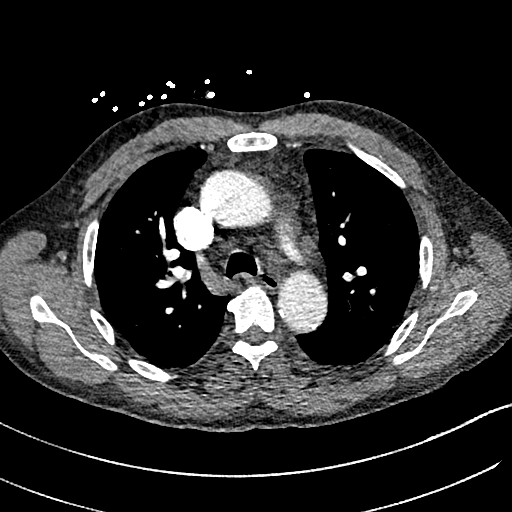
[im 204/283  lung]
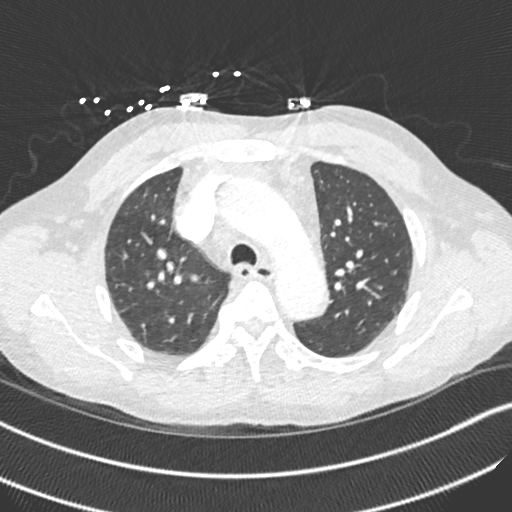
[im 220/283  mediastinal]
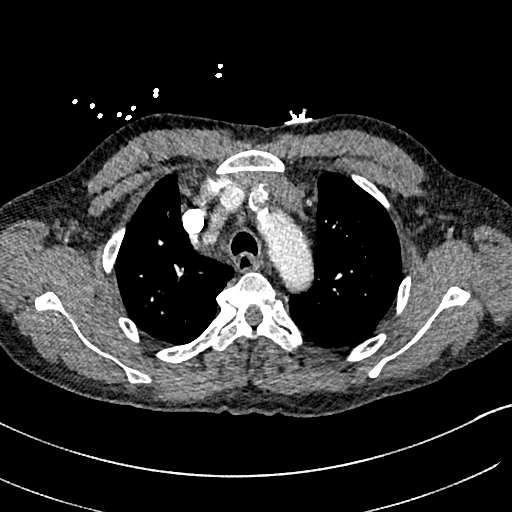
[im 236/283  lung]
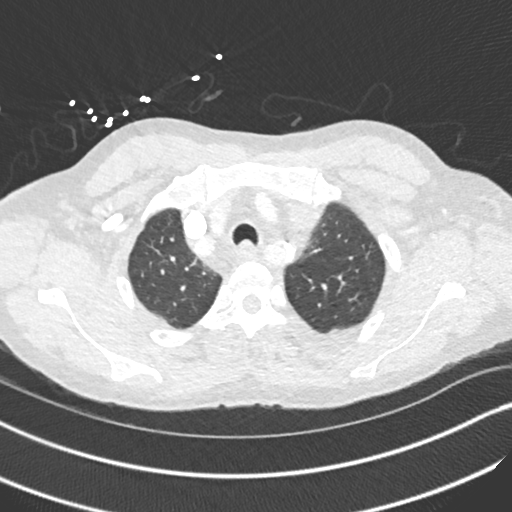
[im 251/283  mediastinal]
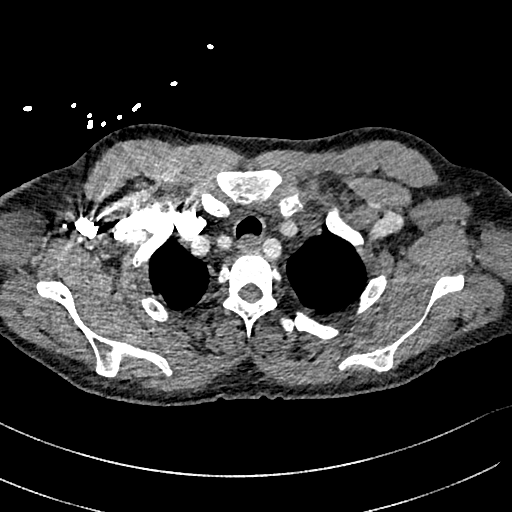
[im 267/283  lung]
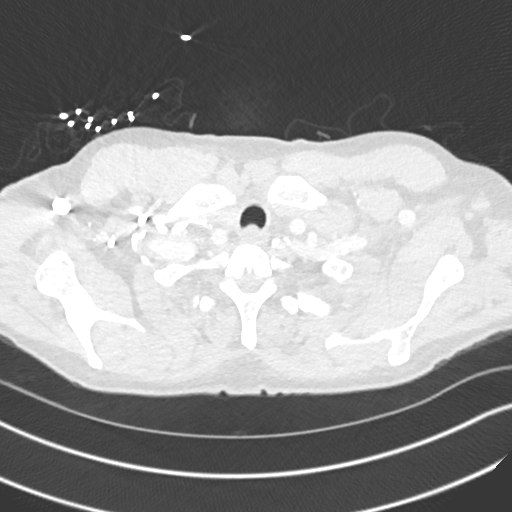

[Series 8: pe 2mm cor · coronal · 0.59mm/px · 1 of 150 slices shown]
[im 75/150  mediastinal]
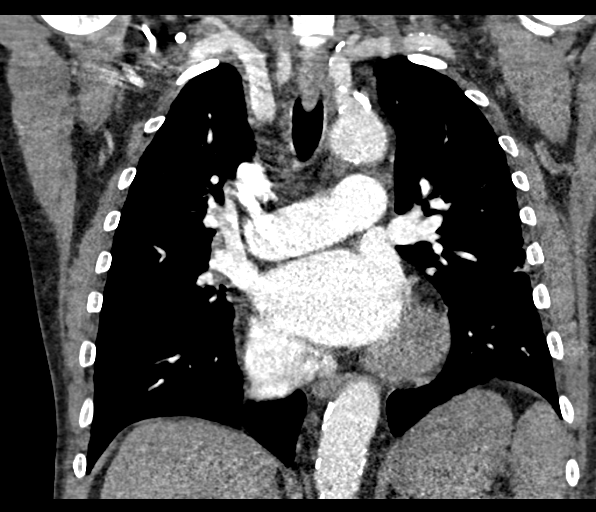

[18 of 36 positions shown; findings below may reference images not displayed]

FINDINGS: Cardiovascular: The pulmonary arteries are well opacified with
contrast to the level of the subsegmental branches. There is no
evidence of acute pulmonary embolism. There is moderate central
enlargement of the pulmonary arteries consistent with pulmonary
arterial hypertension. Diffuse atherosclerosis of the aorta, great
vessels and coronary arteries. No acute vascular findings. The heart
is mildly enlarged. No pericardial effusion.

Mediastinum/Nodes: There are no enlarged mediastinal, hilar or
axillary lymph nodes. The thyroid gland, trachea and esophagus
demonstrate no significant findings.

Lungs/Pleura: There is no pleural effusion. Mild atelectasis or
scarring at both lung bases.

Upper abdomen:  The visualized upper abdomen appears unremarkable.

Musculoskeletal/Chest wall: There is no chest wall mass or
suspicious osseous finding. Mild degenerative changes throughout the
spine.

Review of the MIP images confirms the above findings.
IMPRESSION: 1. No evidence of acute pulmonary embolism or other acute chest
process.
2. Central enlargement of the pulmonary arteries consistent with
pulmonary arterial hypertension.
3. Cardiomegaly, Coronary atherosclerosis and Aortic Atherosclerosis
([IV]-[IV]).

## 2016-12-05 IMAGING — DX DG CHEST 2V
2 series · 2 of 2 positions shown · non-contrast
Comparison: Chest radiograph performed [DATE]

CLINICAL DATA: Acute onset of shortness of breath and generalized
chest pain. Cough. Initial encounter.

EXAM:
CHEST  2 VIEW

[chest pa]
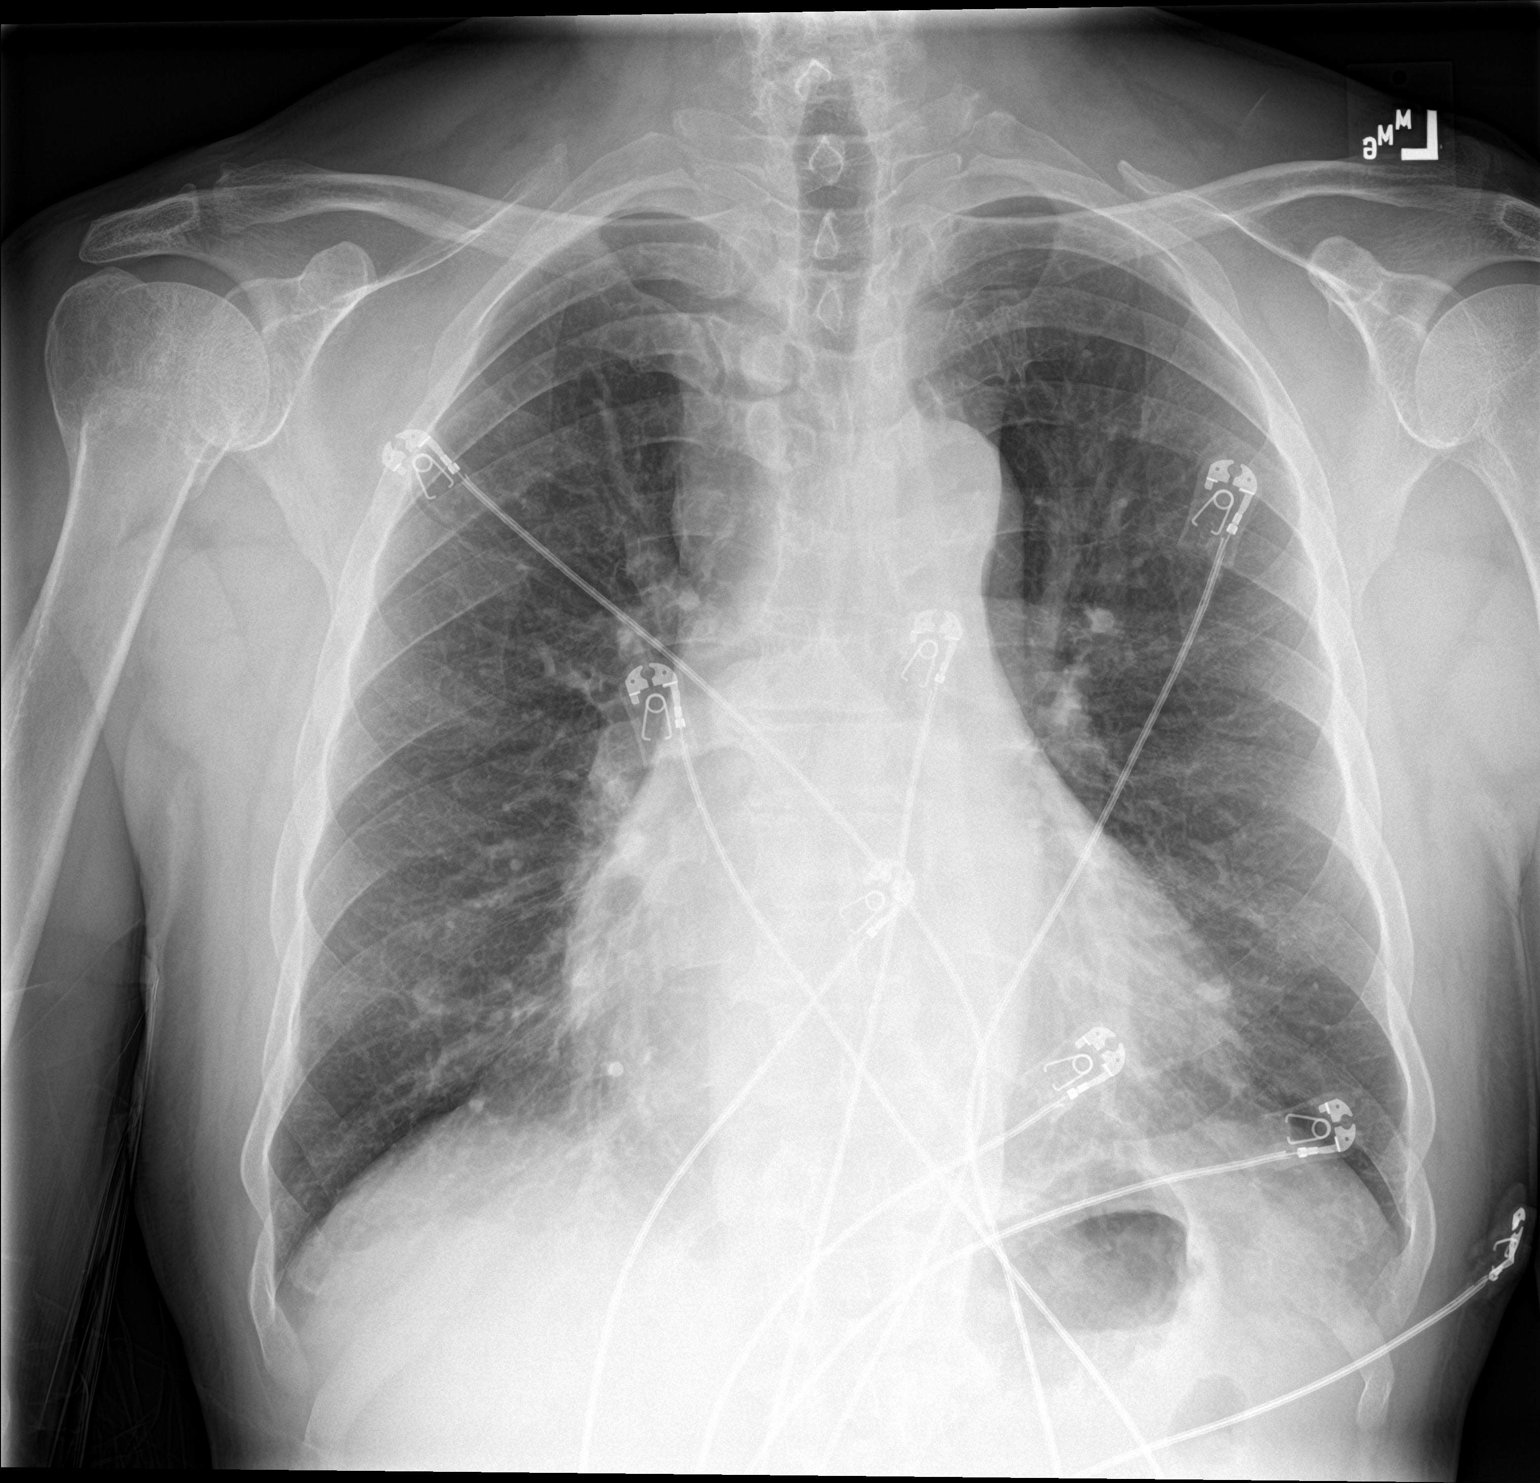

[chest lat]
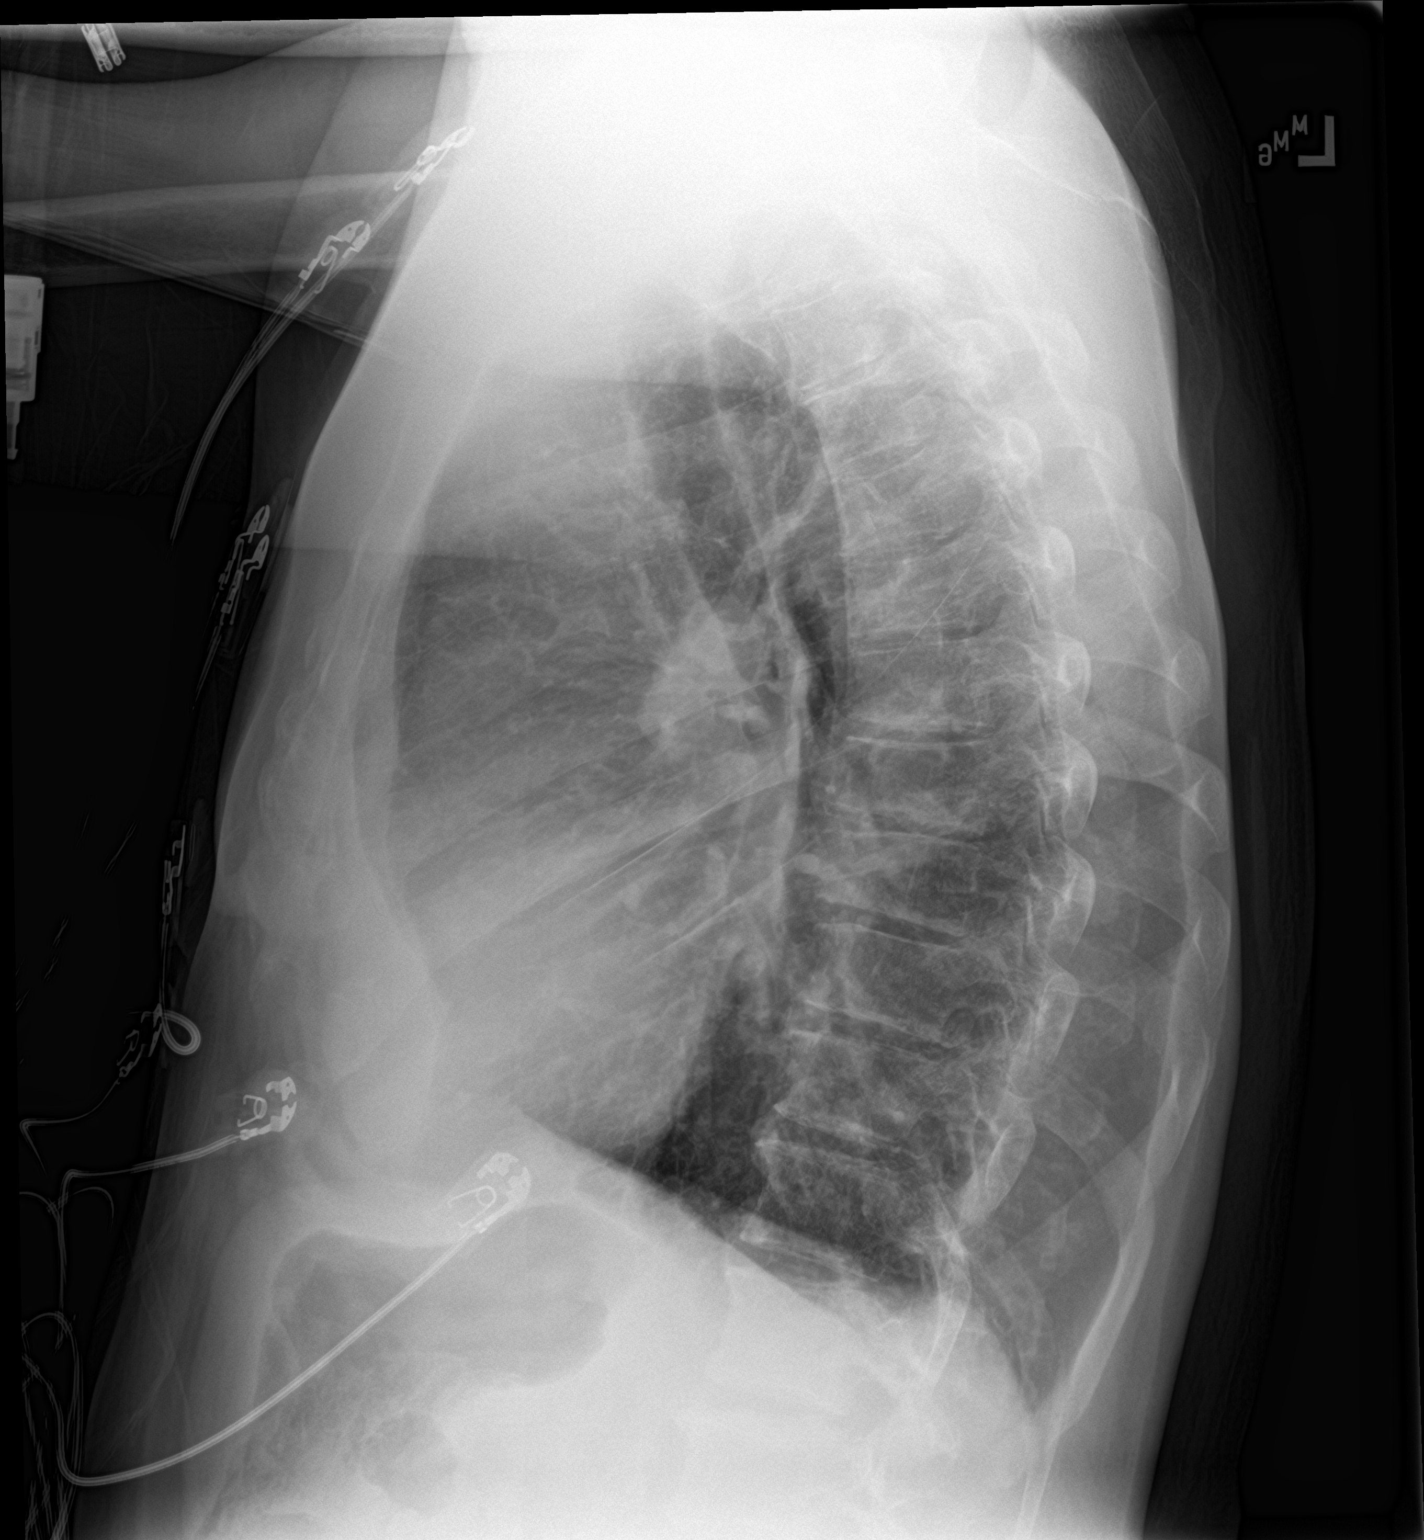

[2 of 2 positions shown; findings below may reference images not displayed]

FINDINGS: The lungs are well-aerated and clear. There is no evidence of focal
opacification, pleural effusion or pneumothorax. Bilateral nipple
shadows are noted.

The heart is borderline enlarged. No acute osseous abnormalities are
seen. There is mild chronic deformity of the proximal right humerus.
IMPRESSION: Borderline cardiomegaly.  Lungs remain grossly clear.

## 2016-12-05 MED ORDER — HYDROCODONE-ACETAMINOPHEN 5-325 MG PO TABS
ORAL_TABLET | ORAL | Status: AC
  Filled 2016-12-05: qty 1

## 2016-12-05 MED ORDER — SEVELAMER CARBONATE 800 MG PO TABS: 3200.0000 mg | ORAL_TABLET | Freq: Three times a day (TID) | ORAL | Status: DC

## 2016-12-05 MED ORDER — IPRATROPIUM BROMIDE 0.02 % IN SOLN
0.5000 mg | Freq: Once | RESPIRATORY_TRACT | Status: AC
  Administered 2016-12-05: 0.5 mg via RESPIRATORY_TRACT
  Filled 2016-12-05: qty 2.5

## 2016-12-05 MED ORDER — METOPROLOL SUCCINATE ER 50 MG PO TB24
50.0000 mg | ORAL_TABLET | Freq: Every day | ORAL | Status: DC
  Administered 2016-12-05 – 2016-12-06 (×2): 50 mg via ORAL
  Filled 2016-12-05 (×3): qty 1

## 2016-12-05 MED ORDER — VITAMIN B-1 100 MG PO TABS
100.0000 mg | ORAL_TABLET | Freq: Every day | ORAL | Status: DC
  Filled 2016-12-05 (×2): qty 1

## 2016-12-05 MED ORDER — SEVELAMER CARBONATE 800 MG PO TABS
3200.0000 mg | ORAL_TABLET | Freq: Three times a day (TID) | ORAL | Status: DC
  Administered 2016-12-05: 3200 mg via ORAL
  Filled 2016-12-05 (×3): qty 4

## 2016-12-05 MED ORDER — CINACALCET HCL 30 MG PO TABS
30.0000 mg | ORAL_TABLET | Freq: Every day | ORAL | Status: DC
  Administered 2016-12-05: 30 mg via ORAL
  Filled 2016-12-05 (×2): qty 1

## 2016-12-05 MED ORDER — ALBUTEROL SULFATE (2.5 MG/3ML) 0.083% IN NEBU
5.0000 mg | INHALATION_SOLUTION | Freq: Once | RESPIRATORY_TRACT | Status: AC
  Administered 2016-12-05: 5 mg via RESPIRATORY_TRACT
  Filled 2016-12-05: qty 6

## 2016-12-05 MED ORDER — CALCITRIOL 0.25 MCG PO CAPS
1.7500 ug | ORAL_CAPSULE | ORAL | Status: DC
  Administered 2016-12-05: 1.75 ug via ORAL
  Filled 2016-12-05: qty 1

## 2016-12-05 MED ORDER — FOLIC ACID 1 MG PO TABS
1.0000 mg | ORAL_TABLET | Freq: Every day | ORAL | Status: DC
  Administered 2016-12-05 – 2016-12-06 (×2): 1 mg via ORAL
  Filled 2016-12-05 (×2): qty 1

## 2016-12-05 MED ORDER — CALCITRIOL 0.5 MCG PO CAPS
ORAL_CAPSULE | ORAL | Status: AC
  Filled 2016-12-05: qty 3

## 2016-12-05 MED ORDER — LORAZEPAM 1 MG PO TABS: 1.0000 mg | ORAL_TABLET | Freq: Four times a day (QID) | ORAL | Status: DC | PRN

## 2016-12-05 MED ORDER — ADULT MULTIVITAMIN W/MINERALS CH
1.0000 | ORAL_TABLET | Freq: Every day | ORAL | Status: DC
  Administered 2016-12-05 – 2016-12-06 (×2): 1 via ORAL
  Filled 2016-12-05 (×2): qty 1

## 2016-12-05 MED ORDER — VITAMIN B-1 100 MG PO TABS
100.0000 mg | ORAL_TABLET | Freq: Every day | ORAL | Status: DC
  Administered 2016-12-05 – 2016-12-06 (×2): 100 mg via ORAL
  Filled 2016-12-05: qty 1

## 2016-12-05 MED ORDER — HYDROCODONE-ACETAMINOPHEN 5-325 MG PO TABS
1.0000 | ORAL_TABLET | Freq: Four times a day (QID) | ORAL | Status: DC | PRN
  Administered 2016-12-05 – 2016-12-06 (×3): 1 via ORAL
  Filled 2016-12-05 (×2): qty 1

## 2016-12-05 MED ORDER — HEPARIN SODIUM (PORCINE) 5000 UNIT/ML IJ SOLN
5000.0000 [IU] | Freq: Three times a day (TID) | INTRAMUSCULAR | Status: DC
  Administered 2016-12-05 – 2016-12-06 (×2): 5000 [IU] via SUBCUTANEOUS
  Filled 2016-12-05 (×3): qty 1

## 2016-12-05 MED ORDER — CALCITRIOL 0.25 MCG PO CAPS
ORAL_CAPSULE | ORAL | Status: AC
  Filled 2016-12-05: qty 1

## 2016-12-05 MED ORDER — VITAMIN B-12 1000 MCG PO TABS
1000.0000 ug | ORAL_TABLET | Freq: Every day | ORAL | Status: DC
  Administered 2016-12-05 – 2016-12-06 (×2): 1000 ug via ORAL
  Filled 2016-12-05 (×2): qty 1

## 2016-12-05 MED ORDER — DARBEPOETIN ALFA 150 MCG/0.3ML IJ SOSY: 150.0000 ug | PREFILLED_SYRINGE | INTRAMUSCULAR | Status: DC

## 2016-12-05 MED ORDER — METHYLPREDNISOLONE SODIUM SUCC 125 MG IJ SOLR
125.0000 mg | Freq: Once | INTRAMUSCULAR | Status: AC
  Administered 2016-12-05: 125 mg via INTRAVENOUS
  Filled 2016-12-05: qty 2

## 2016-12-05 MED ORDER — IPRATROPIUM-ALBUTEROL 0.5-2.5 (3) MG/3ML IN SOLN
3.0000 mL | Freq: Three times a day (TID) | RESPIRATORY_TRACT | Status: DC
  Administered 2016-12-06 (×2): 3 mL via RESPIRATORY_TRACT
  Filled 2016-12-05 (×2): qty 3

## 2016-12-05 MED ORDER — RAMELTEON 8 MG PO TABS
8.0000 mg | ORAL_TABLET | Freq: Every day | ORAL | Status: DC
Start: 2016-12-05 — End: 2016-12-06
  Administered 2016-12-05: 8 mg via ORAL
  Filled 2016-12-05: qty 1

## 2016-12-05 MED ORDER — IPRATROPIUM-ALBUTEROL 0.5-2.5 (3) MG/3ML IN SOLN
3.0000 mL | Freq: Four times a day (QID) | RESPIRATORY_TRACT | Status: DC
  Administered 2016-12-05: 3 mL via RESPIRATORY_TRACT
  Filled 2016-12-05 (×2): qty 3

## 2016-12-05 MED ORDER — PREDNISONE 20 MG PO TABS
40.0000 mg | ORAL_TABLET | Freq: Every day | ORAL | Status: DC
  Administered 2016-12-06: 40 mg via ORAL
  Filled 2016-12-05: qty 2

## 2016-12-05 MED ORDER — SEVELAMER CARBONATE 800 MG PO TABS: 800.0000 mg | ORAL_TABLET | Freq: Three times a day (TID) | ORAL | Status: DC

## 2016-12-05 MED ORDER — THIAMINE HCL 100 MG/ML IJ SOLN: 100.0000 mg | Freq: Every day | INTRAMUSCULAR | Status: DC

## 2016-12-05 MED ORDER — CINACALCET HCL 30 MG PO TABS: 30.0000 mg | ORAL_TABLET | Freq: Every day | ORAL | Status: DC

## 2016-12-05 MED ORDER — IOPAMIDOL (ISOVUE-370) INJECTION 76%
INTRAVENOUS | Status: AC
  Administered 2016-12-05: 100 mL via INTRAVENOUS
  Filled 2016-12-05: qty 100

## 2016-12-05 MED ORDER — GUAIFENESIN ER 600 MG PO TB12
600.0000 mg | ORAL_TABLET | Freq: Two times a day (BID) | ORAL | Status: DC
  Administered 2016-12-05 – 2016-12-06 (×3): 600 mg via ORAL
  Filled 2016-12-05 (×3): qty 1

## 2016-12-05 MED ORDER — CALCITRIOL 0.5 MCG PO CAPS
1.7500 ug | ORAL_CAPSULE | ORAL | Status: DC
  Administered 2016-12-05: 1.75 ug via ORAL

## 2016-12-05 MED ORDER — ALBUTEROL SULFATE (2.5 MG/3ML) 0.083% IN NEBU
INHALATION_SOLUTION | RESPIRATORY_TRACT | Status: AC
  Filled 2016-12-05: qty 6

## 2016-12-05 MED ORDER — AMLODIPINE BESYLATE 5 MG PO TABS
5.0000 mg | ORAL_TABLET | Freq: Every day | ORAL | Status: DC
  Administered 2016-12-05 – 2016-12-06 (×2): 5 mg via ORAL
  Filled 2016-12-05 (×3): qty 1

## 2016-12-05 MED ORDER — GABAPENTIN 300 MG PO CAPS: 300.0000 mg | ORAL_CAPSULE | Freq: Every evening | ORAL | Status: DC | PRN

## 2016-12-05 MED ORDER — AMLODIPINE BESYLATE 5 MG PO TABS: 5.0000 mg | ORAL_TABLET | Freq: Every day | ORAL | Status: DC

## 2016-12-05 MED ORDER — METOPROLOL SUCCINATE ER 50 MG PO TB24: 50.0000 mg | ORAL_TABLET | Freq: Every day | ORAL | Status: DC

## 2016-12-05 MED ORDER — LORAZEPAM 2 MG/ML IJ SOLN: 1.0000 mg | Freq: Four times a day (QID) | INTRAMUSCULAR | Status: DC | PRN

## 2016-12-05 NOTE — H&P (Signed)
Date: 12/05/2016               Patient Name:  Raymond [name] MRN: [id number]  DOB: 1955/10/26 Age / Sex: 61 y.o., male   PCP: Patient, No Pcp Per         Medical Service: Internal Medicine Teaching Service         Attending Physician: Dr. Laverta Baltimore Wonda Olds, MD    First Contact: Dr. Reesa Chew Pager: [phone]  Second Contact: Dr. Marlowe Sax Pager: [phone]       After Hours (After 5p/  First Contact Pager: [phone]  weekends / holidays): Second Contact Pager: [phone]   Chief Complaint: Shortness of Breath.  History of Present Illness: Raymond [name] is a 61 y.o. man with PMHx of ESRD-HD(M, W, F), HTN, HLD, HEP C, polysubstance abuse presents with Worsening shortness of breath, cough with yellow sputum and subjective fever for one week. He also complained of central chest pain increased with coughing, relieved with lying still. Chest pain does not get worse with deep breathing. His sister who lives with him in the same house has similar symptoms. He denies any recent travel or prolonged immobilization recently.  Patient is on hemodialysis, on Monday, Wednesday and Friday. According to patient he is being compliant with his dialysis schedule, he was unable to complete his dialysis on Friday as he has to leave early to attend his father's funeral.  He do endorse some headache, denies any change in vision, sinus congestion, nausea, vomiting, diarrhea or constipation, abdominal pain. He denies any recent change in his appetite or weight. He denies any urinary symptoms, produces urine occasionally mostly during bowel movements.  In ED he was afebrile, BP at 161/96, no leukocytosis, having wheeze and found to be hypoxic with saturation dropped to 85%. He was admitted for hypoxia.  Meds:  Current Meds  Medication Sig  . amLODipine (NORVASC) 5 MG tablet Take 5 mg by mouth daily.  . cinacalcet (SENSIPAR) 30 MG tablet Take 1 tablet (30 mg total) by mouth daily with supper.  . gabapentin (NEURONTIN) 300  MG capsule Take 300 mg by mouth at bedtime as needed (sleep).  [name] HYDROcodone-acetaminophen (NORCO/VICODIN) 5-325 MG tablet Take 1 tablet by mouth every 6 (six) hours as needed for moderate pain.  . metoprolol succinate (TOPROL-XL) 25 MG 24 hr tablet Take 2 tablets (50 mg total) by mouth daily.  [name] oxyCODONE-acetaminophen (PERCOCET/ROXICET) 5-325 MG tablet Take 2 tablets by mouth every 4 (four) hours as needed for severe pain.  . sevelamer (RENAGEL) 800 MG tablet Take 800 mg by mouth 3 (three) times daily with meals.  . thiamine (VITAMIN B-1) 100 MG tablet Take 100 mg by mouth daily.  . vitamin B-12 (CYANOCOBALAMIN) 1000 MCG tablet Take 1,000 mcg by mouth daily.     Allergies: Allergies as of 12/05/2016  . (No Known Allergies)   Past Medical History:  Diagnosis Date  . Anemia   . Chronic kidney disease   . Hepatitis C    Still positive s/p liver biopsy at [facility]  and interferon therapy for 6 months. Most recent lab work was on 10/24/12  . Hypertension   . Substance abuse   . Thyroid disease     Family History: Father was diabetic, mother had diabetes and hypertension..  Social History: Daily smoker for 30 years, smokes 1 pack per day, drinks a pint few days of a week, denies any recent illicit drug use, he used to use cocaine, last use  one year ago according to patient.  Review of Systems: A complete ROS was negative except as per HPI.   Physical Exam: Blood pressure (!) 161/96, pulse 83, temperature 98.4 F (36.9 C), temperature source Oral, resp. rate 19, SpO2 94 %. Vitals:   12/05/16 0445 12/05/16 0500 12/05/16 0515 12/05/16 0650  BP: (!) 143/81 (!) 150/90 (!) 147/81 (!) 161/96  Pulse: 78 82 85 83  Resp: 12 (!) 21 (!) 24 19  Temp:      TempSrc:      SpO2: 100% 100% 100% 94%   General: Vital signs reviewed.  Patient is well-developed and well-nourished, in no acute distress and cooperative with exam.  Head: Normocephalic and atraumatic. Eyes:Anisocoria with right pinpoint,  left mildly dilated, very sluggish response to light. Bilateral pterygium, no ptosis, eye movements within normal limit, no scleral icterus.  Neck: Supple, trachea midline, normal ROM, no JVD, masses, thyromegaly, or carotid bruit present.  Cardiovascular: RRR, S1 normal, S2 normal, no murmurs, gallops, or rubs. Pulmonary/Chest: Few scattered wheeze along with coarse breath sounds bilaterally. Abdominal: Soft, non-tender, non-distended, BS +, no masses, organomegaly, or guarding present.  Musculoskeletal: No joint deformities, erythema, or stiffness, ROM full and nontender. Extremities: Left forearm AVF, No lower extremity edema bilaterally,  pulses symmetric and intact bilaterally. No cyanosis or clubbing. Neurological: A&O x3, Strength is normal and symmetric bilaterally, cranial nerve II-XII are grossly intact, no focal motor deficit, sensory intact to light touch bilaterally.  Psychiatric: Normal mood and affect. speech and behavior is normal. Cognition and memory are normal.  Labs. CBC    Component Value Date/Time   WBC 4.7 12/05/2016 0406   RBC 3.05 (L) 12/05/2016 0406   HGB 9.4 (L) 12/05/2016 0406   HCT 31.5 (L) 12/05/2016 0406   PLT 133 (L) 12/05/2016 0406   MCV 103.3 (H) 12/05/2016 0406   MCH 30.8 12/05/2016 0406   MCHC 29.8 (L) 12/05/2016 0406   RDW 17.6 (H) 12/05/2016 0406   LYMPHSABS 2.1 12/05/2016 0406   MONOABS 0.4 12/05/2016 0406   EOSABS 0.1 12/05/2016 0406   BASOSABS 0.0 12/05/2016 0406   CMP     Component Value Date/Time   NA 141 12/05/2016 0406   K 5.2 (H) 12/05/2016 0406   CL 101 12/05/2016 0406   CO2 28 12/05/2016 0406   GLUCOSE 85 12/05/2016 0406   BUN 49 (H) 12/05/2016 0406   CREATININE 13.79 (H) 12/05/2016 0406   CALCIUM 8.0 (L) 12/05/2016 0406   PROT 7.7 12/05/2016 0406   ALBUMIN 3.2 (L) 12/05/2016 0406   AST 15 12/05/2016 0406   ALT 10 (L) 12/05/2016 0406   ALKPHOS 73 12/05/2016 0406   BILITOT 0.5 12/05/2016 0406   GFRNONAA 3 (L) 12/05/2016 0406    GFRAA 4 (L) 12/05/2016 0406   Troponin. <0.03 BNP. 1215.6  EKG: Sinus rhythm with nonspecific T-wave abnormalities and  peaked T's-similar changes present on prior EKG done in June 2017.  CXR: FINDINGS: The lungs are well-aerated and clear. There is no evidence of focal opacification, pleural effusion or pneumothorax. Bilateral nipple shadows are noted.  The heart is borderline enlarged. No acute osseous abnormalities are seen. There is mild chronic deformity of the proximal right humerus.  IMPRESSION: Borderline cardiomegaly.  Lungs remain grossly clear.  CTA. FINDINGS: Cardiovascular: The pulmonary arteries are well opacified with contrast to the level of the subsegmental branches. There is no evidence of acute pulmonary embolism. There is moderate central enlargement of the pulmonary arteries consistent with pulmonary arterial hypertension.  Diffuse atherosclerosis of the aorta, great vessels and coronary arteries. No acute vascular findings. The heart is mildly enlarged. No pericardial effusion.  Mediastinum/Nodes: There are no enlarged mediastinal, hilar or axillary lymph nodes. The thyroid gland, trachea and esophagus demonstrate no significant findings.  Lungs/Pleura: There is no pleural effusion. Mild atelectasis or scarring at both lung bases.  Upper abdomen:  The visualized upper abdomen appears unremarkable.  Musculoskeletal/Chest wall: There is no chest wall mass or suspicious osseous finding. Mild degenerative changes throughout the spine.  Review of the MIP images confirms the above findings.  IMPRESSION: 1. No evidence of acute pulmonary embolism or other acute chest process. 2. Central enlargement of the pulmonary arteries consistent with pulmonary arterial hypertension. 3. Cardiomegaly, Coronary atherosclerosis and Aortic Atherosclerosis (ICD10-I70.0).  Assessment & Plan by Problem: Raymond [name] is a 61 y.o. man with PMHx of ESRD-HD(M, W, F), HTN, HLD,  HEP C, polysubstance abuse presents with Worsening shortness of breath, cough with yellow sputum and subjective fever for one week.  Shortness of breath/hypoxia. His initial presentation was more consistent with COPD exacerbation-but patient has no diagnosis of COPD, although a long time smoker, but denies any symptoms of asthma or COPD in the past. His wheezing improved with albuterol treatment, remained hypoxic in mid 80s with sitting and with ambulation.  Although he does not appear clinically volume overload, chest x-ray was normal, no previous echo in record, but he was unable to complete his dialysis on Friday and due for dialysis today. BNP elevated in 1200s but unable to rely on that because of ESRD.  PE can be another possibility because his renal disease can make him hypercoagulable. He denies any recent travel or immobilization. His fill score for PE was 0.  Upper respiratory symptoms of worsening cough and congestion can explain mild shortness of breath but he should not be hypoxic without any underlying lung condition.  -Admit to telemetry. -CTA to rule out PE- negative for PE. -Albuterol nebulizer when necessary. -Oxygen therapy. -ECHO. -Should get a pulmonary function testing as an outpatient.  ESRD. He is compliant with his dialysis schedule of Monday, Wednesday and Friday. Unable to complete his Friday session, did 3 out of 4 hours. Clinically he does not appear volume overload, mildly elevated potassium at 5.2. -Consult nephrology for dialysis.  Hypertension. Mildly elevated blood pressure. He never took his morning home meds. -Continue home dose of amlodipine 5 mg daily. -Continue metoprolol 50 mg daily.  History of hep C. According to the chart it was treated with interferon therapy for 6 month. Last recorded testing was in 2011 in epoch which shows positive viral load.  History of polysubstance abuse. Still uses alcohol couple of times a week. Denies any more cocaine  use, according to patient last use was one year ago. -CIWA protocol.  CODE STATUS. Full DVT prophylaxis. Heparin Diet. Renal diet  Dispo: Admit patient to Observation with expected length of stay less than 2 midnights.  SignedLorella Nimrod, MD 12/05/2016, 8:05 AM  Pager: [phone]

## 2016-12-05 NOTE — ED Provider Notes (Signed)
Blood pressure (!) 161/96, pulse 83, temperature 98.4 F (36.9 C), temperature source Oral, resp. rate 19, SpO2 94 %.  Assuming care from Dr. Roxanne Mins.  In short, Raymond [name] is a 61 y.o. male with a chief complaint of Shortness of Breath .  Refer to the original H&P for additional details.  The current plan of care is to admit the patient with hypoxemia after 3 neb treatments.  08:00 AM  Discussed patient's case with IM teaching team. Patient and family (if present) updated with plan. Care transferred to Internal Medicine service.  I reviewed all nursing notes, vitals, pertinent old records, EKGs, labs, imaging (as available).  Nanda Quinton, MD   Margette Fast, MD 12/05/16 [phone]

## 2016-12-05 NOTE — ED Notes (Addendum)
Pt O2 sats dropped from 95% to 84% on room air, after breathing treatment removed.  Sats increased to 88% on 2L and 94% on 3L.  MD aware.

## 2016-12-05 NOTE — Progress Notes (Signed)
Pt. Requesting med to help him sleep. On call for IMTS paged to make aware.

## 2016-12-05 NOTE — ED Triage Notes (Signed)
Patient reports worsening SOB with productive cough , wheezing and chest congestion onset last week , hemodialysis q Mon/Wed/Fri.

## 2016-12-05 NOTE — ED Provider Notes (Signed)
[facility] DEPT Provider Note   CSN: [id number] Arrival date & time: 12/05/16  5176     History   Chief Complaint Chief Complaint  Patient presents with  . Shortness of Breath    HPI Raymond [name] is a 61 y.o. male.  Patient with history of ESRD-HD, HTN, HLD, HEP C, polysubstance abuse presents with c/o SOB the has been progressive over the last week, associated with cough. No fever or chest pain. He denies use of Albuterol at home. He continues to smoke. He reports he last dialyzed 3 days ago (12/02/16) and has not missed any treatments recently. He endorses medication compliance. No nausea or vomiting.    The history is provided by the patient. No language interpreter was used.  Shortness of Breath  Associated symptoms include cough and wheezing. Pertinent negatives include no fever and no chest pain.    Past Medical History:  Diagnosis Date  . Anemia   . Chronic kidney disease   . Hepatitis C    Still positive s/p liver biopsy at [facility]  and interferon therapy for 6 months. Most recent lab work was on 10/24/12  . Hypertension   . Substance abuse   . Thyroid disease     Patient Active Problem List   Diagnosis Date Noted  . Hyperkalemia 12/19/2015  . Hypoglycemia 12/19/2015  . Polysubstance abuse 12/19/2015  . Homelessness 12/19/2015  . Anemia of chronic disease 12/19/2015  . Chest pain 12/19/2013  . Pain in limb 12/04/2012  . End stage renal disease ([place]) 12/04/2012  . HEPATITIS C 09/07/2006  . HYPERCHOLESTEROLEMIA 09/07/2006  . HYPERTENSION, BENIGN SYSTEMIC 09/07/2006    Past Surgical History:  Procedure Laterality Date  . AV FISTULA PLACEMENT Left Aug. 2013 ?  [name] LIVER BIOPSY    . SHUNTOGRAM N/A 12/11/2012   Procedure: Earney Mallet;  Surgeon: Serafina Mitchell, MD;  Location: [facility] C[facility] LAB;  Service: Cardiovascular;  Laterality: N/A;       Home Medications    Prior to Admission medications   Medication Sig Start Date End Date Taking? Authorizing Provider    amLODipine (NORVASC) 5 MG tablet Take 5 mg by mouth daily.    [provider]  cinacalcet (SENSIPAR) 30 MG tablet Take 1 tablet (30 mg total) by mouth daily with supper. 12/21/15   Arrien, Jimmy Picket, MD  HYDROcodone-acetaminophen (NORCO/VICODIN) 5-325 MG tablet Take 1 tablet by mouth every 6 (six) hours as needed for moderate pain. 02/16/16   Lawyer, Harrell Gave, PA-C  metoprolol succinate (TOPROL-XL) 25 MG 24 hr tablet Take 2 tablets (50 mg total) by mouth daily. 12/21/15   Arrien, Jimmy Picket, MD  oxyCODONE-acetaminophen (PERCOCET/ROXICET) 5-325 MG tablet Take 2 tablets by mouth every 4 (four) hours as needed for severe pain. 10/12/16   Horton, Barbette Hair, MD  sevelamer (RENAGEL) 800 MG tablet Take 800 mg by mouth 3 (three) times daily with meals.    [provider]  thiamine (VITAMIN B-1) 100 MG tablet Take 100 mg by mouth daily.    [provider]  vitamin B-12 (CYANOCOBALAMIN) 1000 MCG tablet Take 1,000 mcg by mouth daily.    [provider]    Family History Family History  Problem Relation Age of Onset  . Diabetes Father   . Hypertension Father     Social History Social History  Substance Use Topics  . Smoking status: Former Smoker    Years: 25.00    Quit date: 08/01/2011  . Smokeless tobacco: Never Used  . Alcohol  use Yes     Comment: 3 shots of liquor today (12/18/15)     Allergies   Patient has no known allergies.   Review of Systems Review of Systems  Constitutional: Negative for chills and fever.  HENT: Negative.   Respiratory: Positive for cough, shortness of breath and wheezing.   Cardiovascular: Negative.  Negative for chest pain.  Gastrointestinal: Negative.  Negative for nausea.  Musculoskeletal: Negative.   Skin: Negative.   Neurological: Negative.      Physical Exam Updated Vital Signs BP (!) 147/81   Pulse 85   Temp 98.4 F (36.9 C) (Oral)   Resp (!) 24   SpO2 100%   Physical Exam  Constitutional: He is  oriented to person, place, and time. He appears well-developed and well-nourished.  HENT:  Head: Normocephalic.  Neck: Normal range of motion. Neck supple.  Cardiovascular: Normal rate and regular rhythm.   Pulmonary/Chest: Effort normal. No respiratory distress. He has wheezes. He exhibits no tenderness.  Abdominal: Soft. Bowel sounds are normal. There is no tenderness. There is no rebound and no guarding.  Musculoskeletal: Normal range of motion. He exhibits no edema.  Neurological: He is alert and oriented to person, place, and time.  Skin: Skin is warm and dry. No rash noted.  Psychiatric: He has a normal mood and affect.     ED Treatments / Results  Labs (all labs ordered are listed, but only abnormal results are displayed) Labs Reviewed  CBC WITH DIFFERENTIAL/PLATELET - Abnormal; Notable for the following:       Result Value   RBC 3.05 (*)    Hemoglobin 9.4 (*)    HCT 31.5 (*)    MCV 103.3 (*)    MCHC 29.8 (*)    RDW 17.6 (*)    Platelets 133 (*)    All other components within normal limits  BRAIN NATRIURETIC PEPTIDE - Abnormal; Notable for the following:    B Natriuretic Peptide 1,215.6 (*)    All other components within normal limits  COMPREHENSIVE METABOLIC PANEL - Abnormal; Notable for the following:    Potassium 5.2 (*)    BUN 49 (*)    Creatinine, Ser 13.79 (*)    Calcium 8.0 (*)    Albumin 3.2 (*)    ALT 10 (*)    GFR calc non Af Amer 3 (*)    GFR calc Af Amer 4 (*)    All other components within normal limits  TROPONIN I    EKG  EKG Interpretation  Date/Time:  Monday Dec 05 2016 03:24:58 EDT Ventricular Rate:  82 PR Interval:    QRS Duration: 91 QT Interval:  405 QTC Calculation: 473 R Axis:   57 Text Interpretation:  Sinus rhythm Nonspecific T abnrm, anterolateral leads When compared with ECG of 12/18/2015, No significant change was found Confirmed by Delora Fuel ([id number]) on 12/05/2016 3:48:58 AM       Radiology Dg Chest 2 View  Result Date:  12/05/2016 CLINICAL DATA:  Acute onset of shortness of breath and generalized chest pain. Cough. Initial encounter. EXAM: CHEST  2 VIEW COMPARISON:  Chest radiograph performed 02/16/2016 FINDINGS: The lungs are well-aerated and clear. There is no evidence of focal opacification, pleural effusion or pneumothorax. Bilateral nipple shadows are noted. The heart is borderline enlarged. No acute osseous abnormalities are seen. There is mild chronic deformity of the proximal right humerus. IMPRESSION: Borderline cardiomegaly.  Lungs remain grossly clear. Electronically Signed   By: Francoise Schaumann.D.  On: 12/05/2016 04:20    Procedures Procedures (including critical care time)  Medications Ordered in ED Medications  albuterol (PROVENTIL) (2.5 MG/3ML) 0.083% nebulizer solution 5 mg (5 mg Nebulization Given 12/05/16 0330)  albuterol (PROVENTIL) (2.5 MG/3ML) 0.083% nebulizer solution 5 mg (5 mg Nebulization Given 12/05/16 0434)  ipratropium (ATROVENT) nebulizer solution 0.5 mg (0.5 mg Nebulization Given 12/05/16 0434)     Initial Impression / Assessment and Plan / ED Course  I have reviewed the triage vital signs and the nursing notes.  Pertinent labs & imaging results that were available during my care of the patient were reviewed by me and considered in my medical decision making (see chart for details).     Patient presents with SOB and wheezing. He denies use of albuterol at home, continues to smoke. He has been compliant with dialysis treatments and does not appear to be in need of urgent dialysis at this time.   The patient has had 2 nebulizer treatments and is improved but continues to wheeze.  Patient care left with Dr. Roxanne Mins for re-evaluation after 3rd duoneb and disposition. Likely admission for COPD exacerbation.  Final Clinical Impressions(s) / ED Diagnoses   Final diagnoses:  None   1. SOB 2. Wheezing 3. Tobacco abuse  New Prescriptions New Prescriptions   No medications on  file     Dennie Bible 78/29/56 2130    Glick, David, MD 86/57/84 [id number]    Delora Fuel, MD 95/28/41 [phone]

## 2016-12-05 NOTE — Procedures (Signed)
Tol HD treatment, BP elevated, BFR 400 currently; lungs with bilat rhonchi. Raymond [name] C

## 2016-12-05 NOTE — Progress Notes (Signed)
MD at bedside states pt will go for dialysis today and to hold all blood pressure medications. Informed MD that pt states he does not drink on a daily bases and has not had a drink recently  Raymond [name]

## 2016-12-05 NOTE — Consult Note (Signed)
[facility] Renal Consultation Note    Indication for Consultation:  Management of ESRD/hemodialysis; anemia, hypertension/volume and secondary hyperparathyroidism  HPI: Raymond [name] is a 61 y.o. male with ESRD secondary to HTN, started HD 01/2012. PMH includes HTN, H/o Hep C (now treated, undetectable VL as of 08/2016), H/o polysubstance abuse, h/o homelessness.He presented to the ED this am with progressive SOB and cough that began 3-4 days ago. ED course was significant for O2 sats down to 85%. He is admitted under observation status for further evaluation of hypoxia. CXR clear and chest CT angio neg for acute PE.   Seen sitting up in bed, on 3L O2. Endorses productive cough with yellow phlgem and subjective fevers. Says chest feels sore when he coughs. Hasn't been eating much with cough, denies nausea, vomiting, diarrhea. Breathing has improved some with nebulizer treatment. Denies recent drug or alcohol use.  Dialyzes at [facility] M[facility]. Last HD was Friday for 2:44, left early to attend father's funeral, but has been compliant with treatments the past week. Left last treatment at 73.1 kg, slightly below his EDW of 73.5kg.  Past Medical History:  Diagnosis Date  . Anemia   . Chronic kidney disease   . Hepatitis C    Still positive s/p liver biopsy at [facility]  and interferon therapy for 6 months. Most recent lab work was on 10/24/12  . Hypertension   . Substance abuse   . Thyroid disease    Past Surgical History:  Procedure Laterality Date  . AV FISTULA PLACEMENT Left Aug. 2013 ?  [name] LIVER BIOPSY    . SHUNTOGRAM N/A 12/11/2012   Procedure: Earney Mallet;  Surgeon: Serafina Mitchell, MD;  Location: [facility] CATH LAB;  Service: Cardiovascular;  Laterality: N/A;   Family History  Problem Relation Age of Onset  . Diabetes Father   . Hypertension Father    Social History:  reports that he quit smoking about 5 years ago. He quit after 25.00 years of use. He has never used  smokeless tobacco. He reports that he drinks alcohol. He reports that he uses drugs, including Cocaine and Marijuana. No Known Allergies Prior to Admission medications   Medication Sig Start Date End Date Taking? Authorizing Provider  amLODipine (NORVASC) 5 MG tablet Take 5 mg by mouth daily.   Yes [provider]  cinacalcet (SENSIPAR) 30 MG tablet Take 1 tablet (30 mg total) by mouth daily with supper. 12/21/15  Yes Arrien, Jimmy Picket, MD  gabapentin (NEURONTIN) 300 MG capsule Take 300 mg by mouth at bedtime as needed (sleep).   Yes [provider]  HYDROcodone-acetaminophen (NORCO/VICODIN) 5-325 MG tablet Take 1 tablet by mouth every 6 (six) hours as needed for moderate pain. 02/16/16  Yes Lawyer, Harrell Gave, PA-C  metoprolol succinate (TOPROL-XL) 25 MG 24 hr tablet Take 2 tablets (50 mg total) by mouth daily. 12/21/15  Yes Arrien, Jimmy Picket, MD  oxyCODONE-acetaminophen (PERCOCET/ROXICET) 5-325 MG tablet Take 2 tablets by mouth every 4 (four) hours as needed for severe pain. 10/12/16  Yes Horton, Barbette Hair, MD  sevelamer (RENAGEL) 800 MG tablet Take 800 mg by mouth 3 (three) times daily with meals.   Yes [provider]  thiamine (VITAMIN B-1) 100 MG tablet Take 100 mg by mouth daily.   Yes [provider]  vitamin B-12 (CYANOCOBALAMIN) 1000 MCG tablet Take 1,000 mcg by mouth daily.   Yes [provider]   Current Facility-Administered Medications  Medication Dose Route Frequency Provider Last Rate  Last Dose  . amLODipine (NORVASC) tablet 5 mg  5 mg Oral Daily Shela Leff, MD      . cinacalcet (SENSIPAR) tablet 30 mg  30 mg Oral Q supper Shela Leff, MD      . folic acid (FOLVITE) tablet 1 mg  1 mg Oral Daily Lorella Nimrod, MD      . gabapentin (NEURONTIN) capsule 300 mg  300 mg Oral QHS PRN Shela Leff, MD      . guaiFENesin (MUCINEX) 12 hr tablet 600 mg  600 mg Oral BID Shela Leff, MD      . heparin injection  5,000 Units  5,000 Units Subcutaneous Q8H Shela Leff, MD      . HYDROcodone-acetaminophen (NORCO/VICODIN) 5-325 MG per tablet 1 tablet  1 tablet Oral Q6H PRN Shela Leff, MD      . ipratropium-albuterol (DUONEB) 0.5-2.5 (3) MG/3ML nebulizer solution 3 mL  3 mL Nebulization Q6H Shela Leff, MD      . LORazepam (ATIVAN) tablet 1 mg  1 mg Oral Q6H PRN Lorella Nimrod, MD       Or  . LORazepam (ATIVAN) injection 1 mg  1 mg Intravenous Q6H PRN Lorella Nimrod, MD      . metoprolol succinate (TOPROL-XL) 24 hr tablet 50 mg  50 mg Oral Daily Shela Leff, MD      . multivitamin with minerals tablet 1 tablet  1 tablet Oral Daily Lorella Nimrod, MD      . sevelamer carbonate (RENVELA) tablet 800 mg  800 mg Oral TID WC Shela Leff, MD      . thiamine (VITAMIN B-1) tablet 100 mg  100 mg Oral Daily Lorella Nimrod, MD       Or  . thiamine (B-1) injection 100 mg  100 mg Intravenous Daily Lorella Nimrod, MD      . thiamine (VITAMIN B-1) tablet 100 mg  100 mg Oral Daily Shela Leff, MD      . vitamin B-12 (CYANOCOBALAMIN) tablet 1,000 mcg  1,000 mcg Oral Daily Shela Leff, MD        ROS: As per HPI otherwise negative.  Physical Exam: Vitals:   12/05/16 1045 12/05/16 1100 12/05/16 1120 12/05/16 1252  BP: (!) 141/76 (!) 164/90 (!) 161/83 (!) 184/92  Pulse: 79  90 88  Resp: 18 15 20 18   Temp:    97.5 F (36.4 C)  TempSrc:    Oral  SpO2: 93%  99% 100%  Weight:    74.9 kg (165 lb 2 oz)  Height:    5\' 8"  (1.727 m)     General: WDWN AAM NAD wearing nasal oxygen  Head: NCAT sclera not icteric MMM Neck: Supple. No JVD No masses Lungs: Diffuse wheeze, rhonchi throughout;  Heart: RRR with S1 S2 Abdomen: soft NT + BS Lower extremities: tortuous veins no open wounds or edema  Neuro: A & O  X 3. Moves all extremities spontaneously. Psych:  Responds to questions appropriately with a normal affect. Dialysis Access: LUE AVF aneurysmal  +bruit   Labs: Basic Metabolic  Panel:  Recent Labs Lab 12/05/16 0406  NA 141  K 5.2*  CL 101  CO2 28  GLUCOSE 85  BUN 49*  CREATININE 13.79*  CALCIUM 8.0*   Liver Function Tests:  Recent Labs Lab 12/05/16 0406  AST 15  ALT 10*  ALKPHOS 73  BILITOT 0.5  PROT 7.7  ALBUMIN 3.2*   No results for input(s): LIPASE, AMYLASE in the last 168 hours. No results  for input(s): AMMONIA in the last 168 hours. CBC:  Recent Labs Lab 12/05/16 0406  WBC 4.7  NEUTROABS 2.1  HGB 9.4*  HCT 31.5*  MCV 103.3*  PLT 133*   Cardiac Enzymes:  Recent Labs Lab 12/05/16 0406  TROPONINI <0.03   CBG: No results for input(s): GLUCAP in the last 168 hours. Iron Studies: No results for input(s): IRON, TIBC, TRANSFERRIN, FERRITIN in the last 72 hours. Studies/Results: Dg Chest 2 View  Result Date: 12/05/2016 CLINICAL DATA:  Acute onset of shortness of breath and generalized chest pain. Cough. Initial encounter. EXAM: CHEST  2 VIEW COMPARISON:  Chest radiograph performed 02/16/2016 FINDINGS: The lungs are well-aerated and clear. There is no evidence of focal opacification, pleural effusion or pneumothorax. Bilateral nipple shadows are noted. The heart is borderline enlarged. No acute osseous abnormalities are seen. There is mild chronic deformity of the proximal right humerus. IMPRESSION: Borderline cardiomegaly.  Lungs remain grossly clear. Electronically Signed   By: Garald Balding M.D.   On: 12/05/2016 04:20   Ct Angio Chest Pe W Or Wo Contrast  Result Date: 12/05/2016 CLINICAL DATA:  Shortness of breath with chest pain and congestion. On hemodialysis for 5 years. EXAM: CT ANGIOGRAPHY CHEST WITH CONTRAST TECHNIQUE: Multidetector CT imaging of the chest was performed using the standard protocol during bolus administration of intravenous contrast. Multiplanar CT image reconstructions and MIPs were obtained to evaluate the vascular anatomy. CONTRAST:  100 ml Isovue 370. COMPARISON:  Chest radiographs today.  Abdominal CT  05/07/2010. FINDINGS: Cardiovascular: The pulmonary arteries are well opacified with contrast to the level of the subsegmental branches. There is no evidence of acute pulmonary embolism. There is moderate central enlargement of the pulmonary arteries consistent with pulmonary arterial hypertension. Diffuse atherosclerosis of the aorta, great vessels and coronary arteries. No acute vascular findings. The heart is mildly enlarged. No pericardial effusion. Mediastinum/Nodes: There are no enlarged mediastinal, hilar or axillary lymph nodes. The thyroid gland, trachea and esophagus demonstrate no significant findings. Lungs/Pleura: There is no pleural effusion. Mild atelectasis or scarring at both lung bases. Upper abdomen:  The visualized upper abdomen appears unremarkable. Musculoskeletal/Chest wall: There is no chest wall mass or suspicious osseous finding. Mild degenerative changes throughout the spine. Review of the MIP images confirms the above findings. IMPRESSION: 1. No evidence of acute pulmonary embolism or other acute chest process. 2. Central enlargement of the pulmonary arteries consistent with pulmonary arterial hypertension. 3. Cardiomegaly, Coronary atherosclerosis and Aortic Atherosclerosis (ICD10-I70.0). Electronically Signed   By: Richardean Sale M.D.   On: 12/05/2016 12:17    Dialysis Orders:  [facility] MWF 4h 180 F BFR 450/800 2K/2Ca UF Prof 4 EDW 73.5kg L AVF Heparin 6800 U -Calcitriol 1.75 mcg PO q HD -Venofer 50mg  IV q week  (to start 6/6) -Mircera 150 mcg IV q 2 weeks (5/16)  Assessment/Plan: 1. Dyspnea/Hypoxia on admit - ? COPD exacerbation- CXR unremarkable/CT angio neg for PE/ Echo pending  - per primary  2. ESRD -  MWF - For HD today on schedule  3. Hypertension/volume  - BP elevated -home meds amlodipine/metoprol/ no volume excess by exam but challenge EDW with HD today with ^BP - got below last HD and may be losing weight  4.  Anemia  - Hgb 9.4 next esa dose due 5/30  5.   Metabolic bone disease -  Cont VDRA/Renvela binder  6.  Nutrition - renal diet/vitamins  7. H/o polysubstance abuse   Ogechi Larina Earthly PA-C [facility] Pager  [phone] 12/05/2016, 1:36 PM   Renal Attending: Actue LRI symptoms suggestive of acute bronchitis in smoker on dialysis.  We will plan on HD per usual schedule.  He is being treated for his symptoms. Ciel Chervenak C

## 2016-12-05 NOTE — ED Notes (Signed)
No resp distress noted.  Pt remains on 3L.

## 2016-12-06 ENCOUNTER — Observation Stay (HOSPITAL_BASED_OUTPATIENT_CLINIC_OR_DEPARTMENT_OTHER): Payer: Medicare Other

## 2016-12-06 DIAGNOSIS — I120 Hypertensive chronic kidney disease with stage 5 chronic kidney disease or end stage renal disease: Secondary | ICD-10-CM | POA: Diagnosis not present

## 2016-12-06 DIAGNOSIS — N186 End stage renal disease: Secondary | ICD-10-CM | POA: Diagnosis not present

## 2016-12-06 DIAGNOSIS — F1911 Other psychoactive substance abuse, in remission: Secondary | ICD-10-CM

## 2016-12-06 DIAGNOSIS — E785 Hyperlipidemia, unspecified: Secondary | ICD-10-CM

## 2016-12-06 DIAGNOSIS — Z8249 Family history of ischemic heart disease and other diseases of the circulatory system: Secondary | ICD-10-CM

## 2016-12-06 DIAGNOSIS — R0902 Hypoxemia: Secondary | ICD-10-CM

## 2016-12-06 DIAGNOSIS — F17210 Nicotine dependence, cigarettes, uncomplicated: Secondary | ICD-10-CM | POA: Diagnosis not present

## 2016-12-06 DIAGNOSIS — Z79899 Other long term (current) drug therapy: Secondary | ICD-10-CM | POA: Diagnosis not present

## 2016-12-06 DIAGNOSIS — J209 Acute bronchitis, unspecified: Secondary | ICD-10-CM | POA: Diagnosis not present

## 2016-12-06 DIAGNOSIS — J449 Chronic obstructive pulmonary disease, unspecified: Secondary | ICD-10-CM

## 2016-12-06 DIAGNOSIS — Z833 Family history of diabetes mellitus: Secondary | ICD-10-CM | POA: Diagnosis not present

## 2016-12-06 DIAGNOSIS — I360 Nonrheumatic tricuspid (valve) stenosis: Secondary | ICD-10-CM | POA: Diagnosis not present

## 2016-12-06 DIAGNOSIS — J440 Chronic obstructive pulmonary disease with acute lower respiratory infection: Secondary | ICD-10-CM | POA: Diagnosis not present

## 2016-12-06 DIAGNOSIS — Z992 Dependence on renal dialysis: Secondary | ICD-10-CM

## 2016-12-06 DIAGNOSIS — R1031 Right lower quadrant pain: Secondary | ICD-10-CM

## 2016-12-06 DIAGNOSIS — Z8619 Personal history of other infectious and parasitic diseases: Secondary | ICD-10-CM | POA: Diagnosis not present

## 2016-12-06 LAB — RENAL FUNCTION PANEL
Albumin: 3.1 g/dL — ABNORMAL LOW (ref 3.5–5.0)
Anion gap: 12 (ref 5–15)
BUN: 32 mg/dL — AB (ref 6–20)
CHLORIDE: 95 mmol/L — AB (ref 101–111)
CO2: 27 mmol/L (ref 22–32)
CREATININE: 8.59 mg/dL — AB (ref 0.61–1.24)
Calcium: 8.1 mg/dL — ABNORMAL LOW (ref 8.9–10.3)
GFR calc Af Amer: 7 mL/min — ABNORMAL LOW (ref >60)
GFR calc non Af Amer: 6 mL/min — ABNORMAL LOW (ref >60)
GLUCOSE: 82 mg/dL (ref 65–99)
Phosphorus: 4.3 mg/dL (ref 2.5–4.6)
Potassium: 4.1 mmol/L (ref 3.5–5.1)
Sodium: 134 mmol/L — ABNORMAL LOW (ref 135–145)

## 2016-12-06 LAB — ECHOCARDIOGRAM COMPLETE
HEIGHTINCHES: 68 in
WEIGHTICAEL: 2577.6 [oz_av]

## 2016-12-06 LAB — HIV ANTIBODY (ROUTINE TESTING W REFLEX): HIV SCREEN 4TH GENERATION: NONREACTIVE

## 2016-12-06 MED ORDER — PREDNISONE 20 MG PO TABS: 40.0000 mg | ORAL_TABLET | Freq: Every day | ORAL | 0 refills | Status: DC

## 2016-12-06 MED ORDER — SEVELAMER CARBONATE 800 MG PO TABS
3200.0000 mg | ORAL_TABLET | Freq: Three times a day (TID) | ORAL | Status: DC
  Administered 2016-12-06 (×2): 3200 mg via ORAL
  Filled 2016-12-06 (×2): qty 4

## 2016-12-06 MED ORDER — CALCITRIOL 0.25 MCG PO CAPS: 1.7500 ug | ORAL_CAPSULE | ORAL | 0 refills | Status: DC

## 2016-12-06 MED ORDER — FOLIC ACID 1 MG PO TABS: 1.0000 mg | ORAL_TABLET | Freq: Every day | ORAL | 2 refills | Status: DC

## 2016-12-06 MED ORDER — GUAIFENESIN ER 600 MG PO TB12
600.0000 mg | ORAL_TABLET | Freq: Two times a day (BID) | ORAL | 0 refills | Status: DC
Start: 2016-12-06 — End: 2017-05-15

## 2016-12-06 MED ORDER — ADULT MULTIVITAMIN W/MINERALS CH: 1.0000 | ORAL_TABLET | Freq: Every day | ORAL | 2 refills | Status: DC

## 2016-12-06 MED ORDER — ALBUTEROL SULFATE HFA 108 (90 BASE) MCG/ACT IN AERS: 2.0000 | INHALATION_SPRAY | Freq: Four times a day (QID) | RESPIRATORY_TRACT | 2 refills | Status: DC | PRN

## 2016-12-06 NOTE — Progress Notes (Signed)
MD returned page, aware of 2D echo results and stated okay to discharge. Pt discharge education provided at bedside with pt family. Pt IV discontinued, catheter intact and telemetry removed. Pt has all belongings including discharge paperwork and printed prescriptions. Pt discharged via wheelchair with volunteer service  Eila Runyan Leory Plowman

## 2016-12-06 NOTE — Discharge Summary (Signed)
Name: Raymond [name] MRN: [id number] DOB: 14-Jul-1955 61 y.o. PCP: Patient, No Pcp Per  Date of Admission: 12/05/2016  3:19 AM Date of Discharge: 12/06/2016 Attending Physician: Annia Belt, MD  Discharge Diagnosis: 1. Hypoxia due to viral URI.  Active Problems:   Hypoxia   Acute bronchitis   COPD exacerbation (HCC)   End-stage renal disease on hemodialysis [facility])   Discharge Medications: Allergies as of 12/06/2016   No Known Allergies     Medication List    STOP taking these medications   oxyCODONE-acetaminophen 5-325 MG tablet Commonly known as:  PERCOCET/ROXICET     TAKE these medications   albuterol 108 (90 Base) MCG/ACT inhaler Commonly known as:  PROVENTIL HFA;VENTOLIN HFA Inhale 2 puffs into the lungs every 6 (six) hours as needed for wheezing or shortness of breath.   amLODipine 5 MG tablet Commonly known as:  NORVASC Take 5 mg by mouth daily.   calcitRIOL 0.25 MCG capsule Commonly known as:  ROCALTROL Take 7 capsules (1.75 mcg total) by mouth every Monday, Wednesday, and Friday with hemodialysis. Start taking on:  12/07/2016   cinacalcet 30 MG tablet Commonly known as:  SENSIPAR Take 1 tablet (30 mg total) by mouth daily with supper.   folic acid 1 MG tablet Commonly known as:  FOLVITE Take 1 tablet (1 mg total) by mouth daily. Start taking on:  12/07/2016   gabapentin 300 MG capsule Commonly known as:  NEURONTIN Take 300 mg by mouth at bedtime as needed (sleep).   guaiFENesin 600 MG 12 hr tablet Commonly known as:  MUCINEX Take 1 tablet (600 mg total) by mouth 2 (two) times daily.   HYDROcodone-acetaminophen 5-325 MG tablet Commonly known as:  NORCO/VICODIN Take 1 tablet by mouth every 6 (six) hours as needed for moderate pain.   metoprolol succinate 25 MG 24 hr tablet Commonly known as:  TOPROL-XL Take 2 tablets (50 mg total) by mouth daily.   multivitamin with minerals Tabs tablet Take 1 tablet by mouth daily. Start taking on:   12/07/2016   predniSONE 20 MG tablet Commonly known as:  DELTASONE Take 2 tablets (40 mg total) by mouth daily with breakfast. Start taking on:  12/07/2016   sevelamer 800 MG tablet Commonly known as:  RENAGEL Take 800 mg by mouth 3 (three) times daily with meals.   thiamine 100 MG tablet Commonly known as:  VITAMIN B-1 Take 100 mg by mouth daily.   vitamin B-12 1000 MCG tablet Commonly known as:  CYANOCOBALAMIN Take 1,000 mcg by mouth daily.       Disposition and follow-up:   [name] was discharged from [facility] in Good condition.  At the hospital follow up visit please address:  1.  Resolution of his pulmonary symptoms. -He needs pulmonary function testing, to rule out any underlying pulmonary pathology because of his extensive smoking history. -His blood pressure. -He also need a ophthalmology referral because of anisocoria.  2.  Labs / imaging needed at time of follow-up: Hep C with reflex viral load.  3.  Pending labs/ test needing follow-up: None  Follow-up Appointments: Follow-up Information    Clent Demark, PA-C. Go on 12/27/2016.   Specialty:  Physician Assistant Why:  hospital post follow up scheduled for 12/27/2016 at 2:45pm with Domenica Fail PA at the [place] information: [place] [address] [place] Hospital Course by problem  list:  Raymond [name] a 61 y.o.man with PMHx of ESRD-HD(M, W, F), HTN, HLD, HEP C, polysubstance abuse presents with Worsening shortness of breath, cough with yellow sputum and subjective fever for one week.  Shortness of breath/hypoxia.  it can be multifactorial, he was wheezing on presentation, becoming hypoxic with ambulation. He was unable to complete his dialysis on Friday in order to attend his father's funeral. Clinically the he does not appear volume overloaded. He has an extensive smoking history, never had pulmonary  function testing to rule out COPD. He will need a pulmonary function test as an outpatient. He was dialyzed according to his regular schedule on Monday in hospital. His upper respiratory symptoms were treated symptomatically with DuoNeb treatment and prednisone. Next day he improved, his saturation remained stable at rest and with ambulation at home air. He was discharged home on prednisone for 3 more days and albuterol inhaler.  ESRD.  Hemodialysis on Monday, Wednesday and Friday. He did got his Monday session of dialysis in hospital. He was advised to continue his dialysis as scheduled. He was also advised to continue home dose of Sensipar, Aranesp, Renvela.  Macrocytic anemia. He was found to have macrocytic anemia, we've never checked N02 and folic acid as patient was already on supplement. He was advised to continue his home dose of Aranesp, vitamin V25 and folic acid.  Hypertension. His blood pressure was elevated on initial presentation. Which normalized after taking his home meds. He was discharge on his home dose of amlodipine and metoprolol.  History of Polysubstance abuse. He has an history of alcohol and cocaine abuse. According to patient he stopped using cocaine one year ago. He still uses alcohol 2-3 times a week. He smokes one pack per day. He was placed on CIWA protocol during hospitalization which remained 0. He will need extensive counseling for smoking and alcohol cessation.  History of hep C. He has an history of hepatitis C, according to chart it was treated with interferon therapy for 6 month. His last reported positive viral load on record was in 2011. His HIV screen during hospitalization was negative. He will need a follow-up on his hep C viral load.  Discharge Vitals:   BP (!) 149/88 (BP Location: Right Arm)   Pulse 79   Temp 98.2 F (36.8 C) (Oral)   Resp 18   Ht 5\' 8"  (1.727 m)   Wt 161 lb 1.6 oz (73.1 kg)   SpO2 94%   BMI 24.50 kg/m   Gen. He is well  developed gentleman, in no acute distress. Lungs. Few scattered expiratory wheeze and coarse breath sounds bilaterally. CV. Regular rate and rhythm. Abdomen. Soft, mild left lower quadrant tenderness close to groin area, no obvious hernia, bowel sounds positive. Extremities. Left forearm AVF, no edema, no cyanosis, pulses 2+ bilaterally.  Pertinent Labs, Studies, and Procedures:  CBC    Component Value Date/Time   WBC 4.7 12/05/2016 0406   RBC 3.05 (L) 12/05/2016 0406   HGB 9.4 (L) 12/05/2016 0406   HCT 31.5 (L) 12/05/2016 0406   PLT 133 (L) 12/05/2016 0406   MCV 103.3 (H) 12/05/2016 0406   MCH 30.8 12/05/2016 0406   MCHC 29.8 (L) 12/05/2016 0406   RDW 17.6 (H) 12/05/2016 0406   LYMPHSABS 2.1 12/05/2016 0406   MONOABS 0.4 12/05/2016 0406   EOSABS 0.1 12/05/2016 0406   BASOSABS 0.0 12/05/2016 0406   CMP     Component Value Date/Time   NA 134 (L) 12/06/2016 3664  K 4.1 12/06/2016 0549   CL 95 (L) 12/06/2016 0549   CO2 27 12/06/2016 0549   GLUCOSE 82 12/06/2016 0549   BUN 32 (H) 12/06/2016 0549   CREATININE 8.59 (H) 12/06/2016 0549   CALCIUM 8.1 (L) 12/06/2016 0549   PROT 7.7 12/05/2016 0406   ALBUMIN 3.1 (L) 12/06/2016 0549   AST 15 12/05/2016 0406   ALT 10 (L) 12/05/2016 0406   ALKPHOS 73 12/05/2016 0406   BILITOT 0.5 12/05/2016 0406   GFRNONAA 6 (L) 12/06/2016 0549   GFRAA 7 (L) 12/06/2016 0549   Renal function panel  Order: [id number]  Status:  Final result Visible to patient:  No (Not Released) Next appt:  None    Ref Range & Units 05:49 (12/06/16) 1d ago (12/05/16) 23mo ago (12/21/15) 52mo ago (12/19/15) 32mo ago (12/19/15) 24mo ago (12/18/15)   Sodium 135 - 145 mmol/L 134   141  131   137  135  133    Comment: DELTA CHECK NOTED   Potassium 3.5 - 5.1 mmol/L 4.1  5.2   5.3   5.6   5.2   6.5    Comment: NO VISIBLE HEMOLYSIS   Chloride 101 - 111 mmol/L 95   101  91   93   100   99     CO2 22 - 32 mmol/L 27  28  24  22       Glucose, Bld 65 - 99 mg/dL 82  85   86  65  48   80    BUN 6 - 20 mg/dL 32   49   46   85   80   79     Creatinine, Ser 0.61 - 1.24 mg/dL 8.59   13.79   12.41CM   20.49   >18.00   >18.00    Comment: DELTA CHECK NOTED   Calcium 8.9 - 10.3 mg/dL 8.1   8.0   9.3  9.0      Phosphorus 2.5 - 4.6 mg/dL 4.3    11.4       Albumin 3.5 - 5.0 g/dL 3.1   3.2    2.7       GFR calc non Af Amer >60 mL/min 6   3   4   2        GFR calc Af Amer >60 mL/min 7   4CM   4CM   2CM      Comment: (NOTE)        Troponin. <0.03 BNP. 1215.6 HIV antibody. Nonreactive  EKG: Sinus rhythm with nonspecific T-wave abnormalities and  peaked T's-similar changes present on prior EKG done in June 2017.  CXR: FINDINGS: The lungs are well-aerated and clear. There is no evidence of focal opacification, pleural effusion or pneumothorax. Bilateral nipple shadows are noted.  The heart is borderline enlarged. No acute osseous abnormalities are seen. There is mild chronic deformity of the proximal right humerus.  IMPRESSION: Borderline cardiomegaly. Lungs remain grossly clear.  CTA. FINDINGS: Cardiovascular: The pulmonary arteries are well opacified with contrast to the level of the subsegmental branches. There is no evidence of acute pulmonary embolism. There is moderate central enlargement of the pulmonary arteries consistent with pulmonary arterial hypertension. Diffuse atherosclerosis of the aorta, great vessels and coronary arteries. No acute vascular findings. The heart is mildly enlarged. No pericardial effusion.  Mediastinum/Nodes: There are no enlarged mediastinal, hilar or axillary lymph nodes. The thyroid gland, trachea and esophagus demonstrate no significant findings.  Lungs/Pleura: There is  no pleural effusion. Mild atelectasis or scarring at both lung bases.  Upper abdomen: The visualized upper abdomen appears unremarkable.  Musculoskeletal/Chest wall: There is no chest wall mass or suspicious osseous finding. Mild  degenerative changes throughout the spine.  Review of the MIP images confirms the above findings.  IMPRESSION: 1. No evidence of acute pulmonary embolism or other acute chest process. 2. Central enlargement of the pulmonary arteries consistent with pulmonary arterial hypertension. 3. Cardiomegaly, Coronary atherosclerosis and Aortic Atherosclerosis (ICD10-I70.0).   ECHO. 12/06/16. Study Conclusions  - Left ventricle: The cavity size was normal. Wall thickness was  increased in a pattern of moderate LVH. Systolic function was normal. The estimated ejection fraction was in the range of 55% to 60%. Wall motion was normal; there were no regional wall   motion abnormalities. Doppler parameters are consistent with  abnormal left ventricular relaxation (grade 1 diastolic dysfunction). The E/e&' ratio is between 8-15, suggesting indeterminate LV filling pressure. - Aorta: Ascending aortic diameter: 42 mm (S). - Ascending aorta: The ascending aorta was mildly dilated. - Mitral valve: Calcified annulus. There was trivial regurgitation. - Left atrium: Moderately dilated. - Right atrium: The atrium was mildly dilated. - Atrial septum: Mobile IAS - cannot exclude small PFO. - Tricuspid valve: There was mild regurgitation. - Pulmonary arteries: PA peak pressure: 47 mm Hg (S). - Systemic veins: The IVC measures >2.1 cm, but collapses more than  50%, suggesting an elevated RA pressure of 8 mmHg.  Impressions:  - LVEF 55-60%, moderate LVH, normal wall motion, grade 1 DD with indeterminate LV filling pressure, mildly dilated ascending aorta to 4.2 cm, trivial MR, moderate LAE, mild RAE, mobile interatrial septum, mild TR, RVSP 47 mmHg, dilated IVC suggestive of elevated  RA pressure of 8 mmHg.  Discharge Instructions: Discharge Instructions    Diet - low sodium heart healthy    Complete by:  As directed    Discharge instructions    Complete by:  As directed    It was pleasure taking care of  you. Please follow-up with PCP as directed. You also need to follow-up with your eye doctor. I am giving you a prescription for a inhaler you can use it as directed whenever you feel short of breath. Please continue your dialysis as scheduled.   Increase activity slowly    Complete by:  As directed       Signed: Lorella Nimrod, MD 12/06/2016, 12:04 PM   Pager: [phone]

## 2016-12-06 NOTE — Progress Notes (Signed)
  Echocardiogram 2D Echocardiogram has been performed.  Raymond [name] 12/06/2016, 12:54 PM

## 2016-12-06 NOTE — Progress Notes (Signed)
Subjective: Patient was complaining of right lower quadrant pain with coughing. His breathing has been improved.  Objective:  Vital signs in last 24 hours: Vitals:   12/05/16 2017 12/05/16 2033 12/06/16 0431 12/06/16 0949  BP: 119/77  (!) 141/90 (!) 149/88  Pulse: 91  81 79  Resp: 20  18 18   Temp: 98.1 F (36.7 C)  98.2 F (36.8 C)   TempSrc: Oral  Oral   SpO2: 95% 92% 95% 94%  Weight:   161 lb 1.6 oz (73.1 kg)   Height:       Gen. He is well developed gentleman, in no acute distress. Lungs. Few scattered expiratory wheeze and coarse breath sounds bilaterally. CV. Regular rate and rhythm. Abdomen. Soft, mild left lower quadrant tenderness close to groin area, no obvious hernia, bowel sounds positive. Extremities. Left forearm AVF, no edema, no cyanosis, pulses 2+ bilaterally.  Labs. Renal function panel  Order: [id number]  Status:  Final result Visible to patient:  No (Not Released) Next appt:  None    Ref Range & Units 05:49 (12/06/16) 1d ago (12/05/16) 20mo ago (12/21/15) 60mo ago (12/19/15) 25mo ago (12/19/15) 49mo ago (12/18/15)   Sodium 135 - 145 mmol/L 134   141  131   137  135  133    Comment: DELTA CHECK NOTED   Potassium 3.5 - 5.1 mmol/L 4.1  5.2   5.3   5.6   5.2   6.5    Comment: NO VISIBLE HEMOLYSIS   Chloride 101 - 111 mmol/L 95   101  91   93   100   99     CO2 22 - 32 mmol/L 27  28  24  22       Glucose, Bld 65 - 99 mg/dL 82  85  86  65  48   80    BUN 6 - 20 mg/dL 32   49   46   85   80   79     Creatinine, Ser 0.61 - 1.24 mg/dL 8.59   13.79   12.41CM   20.49   >18.00   >18.00    Comment: DELTA CHECK NOTED   Calcium 8.9 - 10.3 mg/dL 8.1   8.0   9.3  9.0      Phosphorus 2.5 - 4.6 mg/dL 4.3    11.4       Albumin 3.5 - 5.0 g/dL 3.1   3.2    2.7       GFR calc non Af Amer >60 mL/min 6   3   4   2        GFR calc Af Amer >60 mL/min 7   4CM   4CM   2CM      Comment: (NOTE)         Assessment/Plan:  Raymond [name] a 61 y.o.man with PMHx of ESRD-HD(M,  W, F), HTN, HLD, HEP C, polysubstance abuse presents with Worsening shortness of breath, cough with yellow sputum and subjective fever for one week.  Shortness of breath/hypoxia. Improved, she was saturating well on room air at rest. Most likely a combination of upper respiratory illness along with some underlying lung condition. -Check oxygen level with ambulation-if remains stable will be discharged home. -Continue prednisone for 4 more days. -If need pulmonary function testing as an outpatient. -Continue Duoneb. -Continue Mucinex.  ESRD. He had his scheduled Monday dialysis yesterday in hospital. -Continue scheduled hemodialysis. -Continue home dose of Sensipar, Aranesp, Renvela.  Macrocytic anemia. Continue home dose of Aranesp, vitamin F62 and folic acid.  Hypertension. Blood pressure mildly elevated, his blood pressure normalized after taking his home meds yesterday. -Continue home dose of amlodipine and metoprolol.  History of polysubstance abuse. His CIWA score remains 0.  History of hep C. According to the chart it was treated with interferon therapy for 6 month. Last recorded testing was in 2011 in epoch which shows positive viral load. His HIV screen is negative. -His primary care physician should be able to follow-up on his hep C status.  Dispo: Will be discharged today.  Lorella Nimrod, MD 12/06/2016, 9:50 AM Pager: [phone]

## 2016-12-06 NOTE — Care Management Note (Addendum)
Case Management Note  Patient Details  Name: Raymond [name] MRN: [id number] Date of Birth: [birth year]  Subjective/Objective:    Admitted with hypoxia, hx of ESRD-HD(M, W, F), HTN, HLD, HEP C, polysubstance abuse. Independent with ADL's , no DME usage PTA. [name] (Sister)     [phone]      Action/Plan: Plan is to d/c to home today. Hospital post follow up scheduled for 12/27/2016 at 2:45pm with Domenica Fail PA at the [facility] CM made pt aware and noted on AVS.             Expected Discharge Date:    12/06/2016          Expected Discharge Plan:  Home/Self Care  Status of Service:  Completed, signed off  If discussed at [place] of Stay Meetings, dates discussed:    Additional Comments:  Sharin Mons, RN 12/06/2016, 11:09 AM

## 2016-12-06 NOTE — Progress Notes (Signed)
MD returned page and aware of follow up appointment at [facility]. MD also aware of ambulation saturations. Awaiting discharge orders  Densil Ottey Leory Plowman

## 2016-12-06 NOTE — Progress Notes (Signed)
SATURATION QUALIFICATIONS: (This note is used to comply with regulatory documentation for home oxygen)  Patient Saturations on Room Air at Rest = 95%  Patient Saturations on Room Air while Ambulating = 94%  Pt ambulated 200 feet in the hallway, no assistive devices and pt nonsymptomatic, states "I feel great" Will inform MD   Raymond [name] Leory Plowman

## 2016-12-06 NOTE — Progress Notes (Signed)
Pt. C/o pain to RLQ, abdomen, worse with cough. On call for IMTS paged to make aware. RN will continue to monitor. Natsumi Whitsitt, Katherine Roan

## 2016-12-06 NOTE — Progress Notes (Signed)
Paged MD regarding 2D echo results are in  [place]

## 2016-12-06 NOTE — Progress Notes (Signed)
MD stated to hold pt until 2D echo results are in  [place]

## 2016-12-06 NOTE — Progress Notes (Addendum)
Assessment/Plan: 1. Dyspnea/Hypoxia on admit - poss Asthmatic bronchitis 2. ESRD -  MWF -   Subjective: Interval History: Better  Objective: Vital signs in last 24 hours: Temp:  [97.5 F (36.4 C)-98.2 F (36.8 C)] 98.2 F (36.8 C) (05/29 0431) Pulse Rate:  [79-91] 79 (05/29 0949) Resp:  [15-20] 18 (05/29 0949) BP: (119-184)/(8-92) 149/88 (05/29 0949) SpO2:  [92 %-100 %] 94 % (05/29 0949) Weight:  [73.1 kg (161 lb 1.6 oz)-75.8 kg (167 lb 1.7 oz)] 73.1 kg (161 lb 1.6 oz) (05/29 0431) Weight change:   Intake/Output from previous day: 05/28 0701 - 05/29 0700 In: 480 [P.O.:480] Out: 2500  Intake/Output this shift: Total I/O In: 120 [P.O.:120] Out: 0   Resp: rhonchi bilaterally and wheezes bilaterally  More comfortable  Lab Results:  Recent Labs  12/05/16 0406  WBC 4.7  HGB 9.4*  HCT 31.5*  PLT 133*   BMET:  Recent Labs  12/05/16 0406 12/06/16 0549  NA 141 134*  K 5.2* 4.1  CL 101 95*  CO2 28 27  GLUCOSE 85 82  BUN 49* 32*  CREATININE 13.79* 8.59*  CALCIUM 8.0* 8.1*   No results for input(s): PTH in the last 72 hours. Iron Studies: No results for input(s): IRON, TIBC, TRANSFERRIN, FERRITIN in the last 72 hours. Studies/Results: Dg Chest 2 View  Result Date: 12/05/2016 CLINICAL DATA:  Acute onset of shortness of breath and generalized chest pain. Cough. Initial encounter. EXAM: CHEST  2 VIEW COMPARISON:  Chest radiograph performed 02/16/2016 FINDINGS: The lungs are well-aerated and clear. There is no evidence of focal opacification, pleural effusion or pneumothorax. Bilateral nipple shadows are noted. The heart is borderline enlarged. No acute osseous abnormalities are seen. There is mild chronic deformity of the proximal right humerus. IMPRESSION: Borderline cardiomegaly.  Lungs remain grossly clear. Electronically Signed   By: Garald Balding M.D.   On: 12/05/2016 04:20   Ct Angio Chest Pe W Or Wo Contrast  Result Date: 12/05/2016 CLINICAL DATA:  Shortness  of breath with chest pain and congestion. On hemodialysis for 5 years. EXAM: CT ANGIOGRAPHY CHEST WITH CONTRAST TECHNIQUE: Multidetector CT imaging of the chest was performed using the standard protocol during bolus administration of intravenous contrast. Multiplanar CT image reconstructions and MIPs were obtained to evaluate the vascular anatomy. CONTRAST:  100 ml Isovue 370. COMPARISON:  Chest radiographs today.  Abdominal CT 05/07/2010. FINDINGS: Cardiovascular: The pulmonary arteries are well opacified with contrast to the level of the subsegmental branches. There is no evidence of acute pulmonary embolism. There is moderate central enlargement of the pulmonary arteries consistent with pulmonary arterial hypertension. Diffuse atherosclerosis of the aorta, great vessels and coronary arteries. No acute vascular findings. The heart is mildly enlarged. No pericardial effusion. Mediastinum/Nodes: There are no enlarged mediastinal, hilar or axillary lymph nodes. The thyroid gland, trachea and esophagus demonstrate no significant findings. Lungs/Pleura: There is no pleural effusion. Mild atelectasis or scarring at both lung bases. Upper abdomen:  The visualized upper abdomen appears unremarkable. Musculoskeletal/Chest wall: There is no chest wall mass or suspicious osseous finding. Mild degenerative changes throughout the spine. Review of the MIP images confirms the above findings. IMPRESSION: 1. No evidence of acute pulmonary embolism or other acute chest process. 2. Central enlargement of the pulmonary arteries consistent with pulmonary arterial hypertension. 3. Cardiomegaly, Coronary atherosclerosis and Aortic Atherosclerosis (ICD10-I70.0). Electronically Signed   By: Richardean Sale M.D.   On: 12/05/2016 12:17    Scheduled: . amLODipine  5 mg Oral Daily  .  calcitRIOL  1.75 mcg Oral Q M,W,F-HD  . cinacalcet  30 mg Oral Q supper  . [START ON 12/07/2016] darbepoetin (ARANESP) injection - DIALYSIS  150 mcg  Intravenous Q Wed-HD  . folic acid  1 mg Oral Daily  . guaiFENesin  600 mg Oral BID  . heparin  5,000 Units Subcutaneous Q8H  . ipratropium-albuterol  3 mL Nebulization TID  . metoprolol succinate  50 mg Oral Daily  . multivitamin with minerals  1 tablet Oral Daily  . predniSONE  40 mg Oral Q breakfast  . ramelteon  8 mg Oral QHS  . sevelamer carbonate  3,200 mg Oral TID WC  . thiamine  100 mg Oral Daily   Or  . thiamine  100 mg Intravenous Daily  . thiamine  100 mg Oral Daily  . vitamin B-12  1,000 mcg Oral Daily     LOS: 0 days   Breanne Olvera C 12/06/2016,10:45 AM

## 2016-12-06 NOTE — Progress Notes (Signed)
MD called and asked to provide pt with a list of primary care doctors and schedule [facility]. Case manager Levada Dy, informed and stated she will provide guidance for primary. Nurse secretary scheduled follow up apt at [place]

## 2016-12-07 DIAGNOSIS — N186 End stage renal disease: Secondary | ICD-10-CM | POA: Diagnosis not present

## 2016-12-07 DIAGNOSIS — D509 Iron deficiency anemia, unspecified: Secondary | ICD-10-CM | POA: Diagnosis not present

## 2016-12-07 DIAGNOSIS — D631 Anemia in chronic kidney disease: Secondary | ICD-10-CM | POA: Diagnosis not present

## 2016-12-07 DIAGNOSIS — N2581 Secondary hyperparathyroidism of renal origin: Secondary | ICD-10-CM | POA: Diagnosis not present

## 2016-12-08 DIAGNOSIS — N2889 Other specified disorders of kidney and ureter: Secondary | ICD-10-CM | POA: Diagnosis not present

## 2016-12-08 DIAGNOSIS — N186 End stage renal disease: Secondary | ICD-10-CM | POA: Diagnosis not present

## 2016-12-08 DIAGNOSIS — Z992 Dependence on renal dialysis: Secondary | ICD-10-CM | POA: Diagnosis not present

## 2016-12-09 DIAGNOSIS — D509 Iron deficiency anemia, unspecified: Secondary | ICD-10-CM | POA: Diagnosis not present

## 2016-12-09 DIAGNOSIS — D631 Anemia in chronic kidney disease: Secondary | ICD-10-CM | POA: Diagnosis not present

## 2016-12-09 DIAGNOSIS — N186 End stage renal disease: Secondary | ICD-10-CM | POA: Diagnosis not present

## 2016-12-09 DIAGNOSIS — N2581 Secondary hyperparathyroidism of renal origin: Secondary | ICD-10-CM | POA: Diagnosis not present

## 2016-12-12 DIAGNOSIS — D509 Iron deficiency anemia, unspecified: Secondary | ICD-10-CM | POA: Diagnosis not present

## 2016-12-12 DIAGNOSIS — N186 End stage renal disease: Secondary | ICD-10-CM | POA: Diagnosis not present

## 2016-12-12 DIAGNOSIS — N2581 Secondary hyperparathyroidism of renal origin: Secondary | ICD-10-CM | POA: Diagnosis not present

## 2016-12-12 DIAGNOSIS — D631 Anemia in chronic kidney disease: Secondary | ICD-10-CM | POA: Diagnosis not present

## 2016-12-14 DIAGNOSIS — N186 End stage renal disease: Secondary | ICD-10-CM | POA: Diagnosis not present

## 2016-12-14 DIAGNOSIS — N2581 Secondary hyperparathyroidism of renal origin: Secondary | ICD-10-CM | POA: Diagnosis not present

## 2016-12-14 DIAGNOSIS — D509 Iron deficiency anemia, unspecified: Secondary | ICD-10-CM | POA: Diagnosis not present

## 2016-12-14 DIAGNOSIS — D631 Anemia in chronic kidney disease: Secondary | ICD-10-CM | POA: Diagnosis not present

## 2016-12-16 DIAGNOSIS — N2581 Secondary hyperparathyroidism of renal origin: Secondary | ICD-10-CM | POA: Diagnosis not present

## 2016-12-16 DIAGNOSIS — D509 Iron deficiency anemia, unspecified: Secondary | ICD-10-CM | POA: Diagnosis not present

## 2016-12-16 DIAGNOSIS — N186 End stage renal disease: Secondary | ICD-10-CM | POA: Diagnosis not present

## 2016-12-16 DIAGNOSIS — D631 Anemia in chronic kidney disease: Secondary | ICD-10-CM | POA: Diagnosis not present

## 2016-12-19 DIAGNOSIS — N2581 Secondary hyperparathyroidism of renal origin: Secondary | ICD-10-CM | POA: Diagnosis not present

## 2016-12-19 DIAGNOSIS — D509 Iron deficiency anemia, unspecified: Secondary | ICD-10-CM | POA: Diagnosis not present

## 2016-12-19 DIAGNOSIS — D631 Anemia in chronic kidney disease: Secondary | ICD-10-CM | POA: Diagnosis not present

## 2016-12-19 DIAGNOSIS — N186 End stage renal disease: Secondary | ICD-10-CM | POA: Diagnosis not present

## 2016-12-21 DIAGNOSIS — N2581 Secondary hyperparathyroidism of renal origin: Secondary | ICD-10-CM | POA: Diagnosis not present

## 2016-12-21 DIAGNOSIS — D631 Anemia in chronic kidney disease: Secondary | ICD-10-CM | POA: Diagnosis not present

## 2016-12-21 DIAGNOSIS — D509 Iron deficiency anemia, unspecified: Secondary | ICD-10-CM | POA: Diagnosis not present

## 2016-12-21 DIAGNOSIS — N186 End stage renal disease: Secondary | ICD-10-CM | POA: Diagnosis not present

## 2016-12-23 DIAGNOSIS — D509 Iron deficiency anemia, unspecified: Secondary | ICD-10-CM | POA: Diagnosis not present

## 2016-12-23 DIAGNOSIS — N2581 Secondary hyperparathyroidism of renal origin: Secondary | ICD-10-CM | POA: Diagnosis not present

## 2016-12-23 DIAGNOSIS — D631 Anemia in chronic kidney disease: Secondary | ICD-10-CM | POA: Diagnosis not present

## 2016-12-23 DIAGNOSIS — N186 End stage renal disease: Secondary | ICD-10-CM | POA: Diagnosis not present

## 2016-12-27 ENCOUNTER — Inpatient Hospital Stay (INDEPENDENT_AMBULATORY_CARE_PROVIDER_SITE_OTHER): Payer: Medicare Other | Admitting: Physician Assistant

## 2016-12-28 DIAGNOSIS — D631 Anemia in chronic kidney disease: Secondary | ICD-10-CM | POA: Diagnosis not present

## 2016-12-28 DIAGNOSIS — D509 Iron deficiency anemia, unspecified: Secondary | ICD-10-CM | POA: Diagnosis not present

## 2016-12-28 DIAGNOSIS — N186 End stage renal disease: Secondary | ICD-10-CM | POA: Diagnosis not present

## 2016-12-28 DIAGNOSIS — N2581 Secondary hyperparathyroidism of renal origin: Secondary | ICD-10-CM | POA: Diagnosis not present

## 2017-01-02 DIAGNOSIS — N186 End stage renal disease: Secondary | ICD-10-CM | POA: Diagnosis not present

## 2017-01-02 DIAGNOSIS — N2581 Secondary hyperparathyroidism of renal origin: Secondary | ICD-10-CM | POA: Diagnosis not present

## 2017-01-02 DIAGNOSIS — D509 Iron deficiency anemia, unspecified: Secondary | ICD-10-CM | POA: Diagnosis not present

## 2017-01-02 DIAGNOSIS — D631 Anemia in chronic kidney disease: Secondary | ICD-10-CM | POA: Diagnosis not present

## 2017-01-06 DIAGNOSIS — D631 Anemia in chronic kidney disease: Secondary | ICD-10-CM | POA: Diagnosis not present

## 2017-01-06 DIAGNOSIS — N2581 Secondary hyperparathyroidism of renal origin: Secondary | ICD-10-CM | POA: Diagnosis not present

## 2017-01-06 DIAGNOSIS — D509 Iron deficiency anemia, unspecified: Secondary | ICD-10-CM | POA: Diagnosis not present

## 2017-01-06 DIAGNOSIS — N186 End stage renal disease: Secondary | ICD-10-CM | POA: Diagnosis not present

## 2017-01-07 DIAGNOSIS — N2889 Other specified disorders of kidney and ureter: Secondary | ICD-10-CM | POA: Diagnosis not present

## 2017-01-07 DIAGNOSIS — Z992 Dependence on renal dialysis: Secondary | ICD-10-CM | POA: Diagnosis not present

## 2017-01-07 DIAGNOSIS — N186 End stage renal disease: Secondary | ICD-10-CM | POA: Diagnosis not present

## 2017-01-09 DIAGNOSIS — N186 End stage renal disease: Secondary | ICD-10-CM | POA: Diagnosis not present

## 2017-01-09 DIAGNOSIS — N2581 Secondary hyperparathyroidism of renal origin: Secondary | ICD-10-CM | POA: Diagnosis not present

## 2017-01-09 DIAGNOSIS — D509 Iron deficiency anemia, unspecified: Secondary | ICD-10-CM | POA: Diagnosis not present

## 2017-01-11 DIAGNOSIS — N186 End stage renal disease: Secondary | ICD-10-CM | POA: Diagnosis not present

## 2017-01-11 DIAGNOSIS — D509 Iron deficiency anemia, unspecified: Secondary | ICD-10-CM | POA: Diagnosis not present

## 2017-01-11 DIAGNOSIS — N2581 Secondary hyperparathyroidism of renal origin: Secondary | ICD-10-CM | POA: Diagnosis not present

## 2017-01-13 DIAGNOSIS — N186 End stage renal disease: Secondary | ICD-10-CM | POA: Diagnosis not present

## 2017-01-13 DIAGNOSIS — N2581 Secondary hyperparathyroidism of renal origin: Secondary | ICD-10-CM | POA: Diagnosis not present

## 2017-01-13 DIAGNOSIS — D509 Iron deficiency anemia, unspecified: Secondary | ICD-10-CM | POA: Diagnosis not present

## 2017-01-16 DIAGNOSIS — D509 Iron deficiency anemia, unspecified: Secondary | ICD-10-CM | POA: Diagnosis not present

## 2017-01-16 DIAGNOSIS — N186 End stage renal disease: Secondary | ICD-10-CM | POA: Diagnosis not present

## 2017-01-16 DIAGNOSIS — N2581 Secondary hyperparathyroidism of renal origin: Secondary | ICD-10-CM | POA: Diagnosis not present

## 2017-01-18 DIAGNOSIS — D509 Iron deficiency anemia, unspecified: Secondary | ICD-10-CM | POA: Diagnosis not present

## 2017-01-18 DIAGNOSIS — N2581 Secondary hyperparathyroidism of renal origin: Secondary | ICD-10-CM | POA: Diagnosis not present

## 2017-01-18 DIAGNOSIS — N186 End stage renal disease: Secondary | ICD-10-CM | POA: Diagnosis not present

## 2017-01-20 DIAGNOSIS — N2581 Secondary hyperparathyroidism of renal origin: Secondary | ICD-10-CM | POA: Diagnosis not present

## 2017-01-20 DIAGNOSIS — N186 End stage renal disease: Secondary | ICD-10-CM | POA: Diagnosis not present

## 2017-01-20 DIAGNOSIS — D509 Iron deficiency anemia, unspecified: Secondary | ICD-10-CM | POA: Diagnosis not present

## 2017-01-23 DIAGNOSIS — N186 End stage renal disease: Secondary | ICD-10-CM | POA: Diagnosis not present

## 2017-01-23 DIAGNOSIS — N2581 Secondary hyperparathyroidism of renal origin: Secondary | ICD-10-CM | POA: Diagnosis not present

## 2017-01-23 DIAGNOSIS — D509 Iron deficiency anemia, unspecified: Secondary | ICD-10-CM | POA: Diagnosis not present

## 2017-01-25 DIAGNOSIS — N2581 Secondary hyperparathyroidism of renal origin: Secondary | ICD-10-CM | POA: Diagnosis not present

## 2017-01-25 DIAGNOSIS — N186 End stage renal disease: Secondary | ICD-10-CM | POA: Diagnosis not present

## 2017-01-25 DIAGNOSIS — D509 Iron deficiency anemia, unspecified: Secondary | ICD-10-CM | POA: Diagnosis not present

## 2017-01-27 DIAGNOSIS — N2581 Secondary hyperparathyroidism of renal origin: Secondary | ICD-10-CM | POA: Diagnosis not present

## 2017-01-27 DIAGNOSIS — D509 Iron deficiency anemia, unspecified: Secondary | ICD-10-CM | POA: Diagnosis not present

## 2017-01-27 DIAGNOSIS — N186 End stage renal disease: Secondary | ICD-10-CM | POA: Diagnosis not present

## 2017-01-30 DIAGNOSIS — N2581 Secondary hyperparathyroidism of renal origin: Secondary | ICD-10-CM | POA: Diagnosis not present

## 2017-01-30 DIAGNOSIS — N186 End stage renal disease: Secondary | ICD-10-CM | POA: Diagnosis not present

## 2017-01-30 DIAGNOSIS — D509 Iron deficiency anemia, unspecified: Secondary | ICD-10-CM | POA: Diagnosis not present

## 2017-02-01 DIAGNOSIS — N186 End stage renal disease: Secondary | ICD-10-CM | POA: Diagnosis not present

## 2017-02-01 DIAGNOSIS — D509 Iron deficiency anemia, unspecified: Secondary | ICD-10-CM | POA: Diagnosis not present

## 2017-02-01 DIAGNOSIS — N2581 Secondary hyperparathyroidism of renal origin: Secondary | ICD-10-CM | POA: Diagnosis not present

## 2017-02-03 DIAGNOSIS — D509 Iron deficiency anemia, unspecified: Secondary | ICD-10-CM | POA: Diagnosis not present

## 2017-02-03 DIAGNOSIS — N186 End stage renal disease: Secondary | ICD-10-CM | POA: Diagnosis not present

## 2017-02-03 DIAGNOSIS — N2581 Secondary hyperparathyroidism of renal origin: Secondary | ICD-10-CM | POA: Diagnosis not present

## 2017-02-06 DIAGNOSIS — D509 Iron deficiency anemia, unspecified: Secondary | ICD-10-CM | POA: Diagnosis not present

## 2017-02-06 DIAGNOSIS — N186 End stage renal disease: Secondary | ICD-10-CM | POA: Diagnosis not present

## 2017-02-06 DIAGNOSIS — N2581 Secondary hyperparathyroidism of renal origin: Secondary | ICD-10-CM | POA: Diagnosis not present

## 2017-02-07 DIAGNOSIS — Z992 Dependence on renal dialysis: Secondary | ICD-10-CM | POA: Diagnosis not present

## 2017-02-07 DIAGNOSIS — N186 End stage renal disease: Secondary | ICD-10-CM | POA: Diagnosis not present

## 2017-02-07 DIAGNOSIS — N2889 Other specified disorders of kidney and ureter: Secondary | ICD-10-CM | POA: Diagnosis not present

## 2017-02-08 DIAGNOSIS — N186 End stage renal disease: Secondary | ICD-10-CM | POA: Diagnosis not present

## 2017-02-08 DIAGNOSIS — D631 Anemia in chronic kidney disease: Secondary | ICD-10-CM | POA: Diagnosis not present

## 2017-02-08 DIAGNOSIS — N2581 Secondary hyperparathyroidism of renal origin: Secondary | ICD-10-CM | POA: Diagnosis not present

## 2017-02-08 DIAGNOSIS — D509 Iron deficiency anemia, unspecified: Secondary | ICD-10-CM | POA: Diagnosis not present

## 2017-02-10 DIAGNOSIS — N2581 Secondary hyperparathyroidism of renal origin: Secondary | ICD-10-CM | POA: Diagnosis not present

## 2017-02-10 DIAGNOSIS — D509 Iron deficiency anemia, unspecified: Secondary | ICD-10-CM | POA: Diagnosis not present

## 2017-02-10 DIAGNOSIS — D631 Anemia in chronic kidney disease: Secondary | ICD-10-CM | POA: Diagnosis not present

## 2017-02-10 DIAGNOSIS — N186 End stage renal disease: Secondary | ICD-10-CM | POA: Diagnosis not present

## 2017-02-13 DIAGNOSIS — N186 End stage renal disease: Secondary | ICD-10-CM | POA: Diagnosis not present

## 2017-02-13 DIAGNOSIS — D509 Iron deficiency anemia, unspecified: Secondary | ICD-10-CM | POA: Diagnosis not present

## 2017-02-13 DIAGNOSIS — D631 Anemia in chronic kidney disease: Secondary | ICD-10-CM | POA: Diagnosis not present

## 2017-02-13 DIAGNOSIS — N2581 Secondary hyperparathyroidism of renal origin: Secondary | ICD-10-CM | POA: Diagnosis not present

## 2017-02-15 DIAGNOSIS — D509 Iron deficiency anemia, unspecified: Secondary | ICD-10-CM | POA: Diagnosis not present

## 2017-02-15 DIAGNOSIS — D631 Anemia in chronic kidney disease: Secondary | ICD-10-CM | POA: Diagnosis not present

## 2017-02-15 DIAGNOSIS — N186 End stage renal disease: Secondary | ICD-10-CM | POA: Diagnosis not present

## 2017-02-15 DIAGNOSIS — N2581 Secondary hyperparathyroidism of renal origin: Secondary | ICD-10-CM | POA: Diagnosis not present

## 2017-02-20 DIAGNOSIS — D631 Anemia in chronic kidney disease: Secondary | ICD-10-CM | POA: Diagnosis not present

## 2017-02-20 DIAGNOSIS — N2581 Secondary hyperparathyroidism of renal origin: Secondary | ICD-10-CM | POA: Diagnosis not present

## 2017-02-20 DIAGNOSIS — N186 End stage renal disease: Secondary | ICD-10-CM | POA: Diagnosis not present

## 2017-02-20 DIAGNOSIS — D509 Iron deficiency anemia, unspecified: Secondary | ICD-10-CM | POA: Diagnosis not present

## 2017-02-22 DIAGNOSIS — D631 Anemia in chronic kidney disease: Secondary | ICD-10-CM | POA: Diagnosis not present

## 2017-02-22 DIAGNOSIS — N186 End stage renal disease: Secondary | ICD-10-CM | POA: Diagnosis not present

## 2017-02-22 DIAGNOSIS — D509 Iron deficiency anemia, unspecified: Secondary | ICD-10-CM | POA: Diagnosis not present

## 2017-02-22 DIAGNOSIS — N2581 Secondary hyperparathyroidism of renal origin: Secondary | ICD-10-CM | POA: Diagnosis not present

## 2017-02-24 DIAGNOSIS — D509 Iron deficiency anemia, unspecified: Secondary | ICD-10-CM | POA: Diagnosis not present

## 2017-02-24 DIAGNOSIS — N2581 Secondary hyperparathyroidism of renal origin: Secondary | ICD-10-CM | POA: Diagnosis not present

## 2017-02-24 DIAGNOSIS — D631 Anemia in chronic kidney disease: Secondary | ICD-10-CM | POA: Diagnosis not present

## 2017-02-24 DIAGNOSIS — N186 End stage renal disease: Secondary | ICD-10-CM | POA: Diagnosis not present

## 2017-02-27 DIAGNOSIS — D509 Iron deficiency anemia, unspecified: Secondary | ICD-10-CM | POA: Diagnosis not present

## 2017-02-27 DIAGNOSIS — N2581 Secondary hyperparathyroidism of renal origin: Secondary | ICD-10-CM | POA: Diagnosis not present

## 2017-02-27 DIAGNOSIS — N186 End stage renal disease: Secondary | ICD-10-CM | POA: Diagnosis not present

## 2017-02-27 DIAGNOSIS — D631 Anemia in chronic kidney disease: Secondary | ICD-10-CM | POA: Diagnosis not present

## 2017-03-01 DIAGNOSIS — D631 Anemia in chronic kidney disease: Secondary | ICD-10-CM | POA: Diagnosis not present

## 2017-03-01 DIAGNOSIS — N2581 Secondary hyperparathyroidism of renal origin: Secondary | ICD-10-CM | POA: Diagnosis not present

## 2017-03-01 DIAGNOSIS — D509 Iron deficiency anemia, unspecified: Secondary | ICD-10-CM | POA: Diagnosis not present

## 2017-03-01 DIAGNOSIS — N186 End stage renal disease: Secondary | ICD-10-CM | POA: Diagnosis not present

## 2017-03-03 DIAGNOSIS — N186 End stage renal disease: Secondary | ICD-10-CM | POA: Diagnosis not present

## 2017-03-03 DIAGNOSIS — D509 Iron deficiency anemia, unspecified: Secondary | ICD-10-CM | POA: Diagnosis not present

## 2017-03-03 DIAGNOSIS — N2581 Secondary hyperparathyroidism of renal origin: Secondary | ICD-10-CM | POA: Diagnosis not present

## 2017-03-03 DIAGNOSIS — D631 Anemia in chronic kidney disease: Secondary | ICD-10-CM | POA: Diagnosis not present

## 2017-03-08 DIAGNOSIS — N186 End stage renal disease: Secondary | ICD-10-CM | POA: Diagnosis not present

## 2017-03-08 DIAGNOSIS — N2581 Secondary hyperparathyroidism of renal origin: Secondary | ICD-10-CM | POA: Diagnosis not present

## 2017-03-08 DIAGNOSIS — D631 Anemia in chronic kidney disease: Secondary | ICD-10-CM | POA: Diagnosis not present

## 2017-03-08 DIAGNOSIS — D509 Iron deficiency anemia, unspecified: Secondary | ICD-10-CM | POA: Diagnosis not present

## 2017-03-10 DIAGNOSIS — N2581 Secondary hyperparathyroidism of renal origin: Secondary | ICD-10-CM | POA: Diagnosis not present

## 2017-03-10 DIAGNOSIS — N2889 Other specified disorders of kidney and ureter: Secondary | ICD-10-CM | POA: Diagnosis not present

## 2017-03-10 DIAGNOSIS — D631 Anemia in chronic kidney disease: Secondary | ICD-10-CM | POA: Diagnosis not present

## 2017-03-10 DIAGNOSIS — N186 End stage renal disease: Secondary | ICD-10-CM | POA: Diagnosis not present

## 2017-03-10 DIAGNOSIS — Z992 Dependence on renal dialysis: Secondary | ICD-10-CM | POA: Diagnosis not present

## 2017-03-10 DIAGNOSIS — D509 Iron deficiency anemia, unspecified: Secondary | ICD-10-CM | POA: Diagnosis not present

## 2017-03-15 DIAGNOSIS — N2581 Secondary hyperparathyroidism of renal origin: Secondary | ICD-10-CM | POA: Diagnosis not present

## 2017-03-15 DIAGNOSIS — N186 End stage renal disease: Secondary | ICD-10-CM | POA: Diagnosis not present

## 2017-03-15 DIAGNOSIS — D631 Anemia in chronic kidney disease: Secondary | ICD-10-CM | POA: Diagnosis not present

## 2017-03-15 DIAGNOSIS — D509 Iron deficiency anemia, unspecified: Secondary | ICD-10-CM | POA: Diagnosis not present

## 2017-03-15 DIAGNOSIS — Z23 Encounter for immunization: Secondary | ICD-10-CM | POA: Diagnosis not present

## 2017-03-17 DIAGNOSIS — D509 Iron deficiency anemia, unspecified: Secondary | ICD-10-CM | POA: Diagnosis not present

## 2017-03-17 DIAGNOSIS — N2581 Secondary hyperparathyroidism of renal origin: Secondary | ICD-10-CM | POA: Diagnosis not present

## 2017-03-17 DIAGNOSIS — N186 End stage renal disease: Secondary | ICD-10-CM | POA: Diagnosis not present

## 2017-03-17 DIAGNOSIS — D631 Anemia in chronic kidney disease: Secondary | ICD-10-CM | POA: Diagnosis not present

## 2017-03-17 DIAGNOSIS — Z23 Encounter for immunization: Secondary | ICD-10-CM | POA: Diagnosis not present

## 2017-03-20 DIAGNOSIS — D631 Anemia in chronic kidney disease: Secondary | ICD-10-CM | POA: Diagnosis not present

## 2017-03-20 DIAGNOSIS — N2581 Secondary hyperparathyroidism of renal origin: Secondary | ICD-10-CM | POA: Diagnosis not present

## 2017-03-20 DIAGNOSIS — Z23 Encounter for immunization: Secondary | ICD-10-CM | POA: Diagnosis not present

## 2017-03-20 DIAGNOSIS — N186 End stage renal disease: Secondary | ICD-10-CM | POA: Diagnosis not present

## 2017-03-20 DIAGNOSIS — D509 Iron deficiency anemia, unspecified: Secondary | ICD-10-CM | POA: Diagnosis not present

## 2017-03-22 DIAGNOSIS — Z23 Encounter for immunization: Secondary | ICD-10-CM | POA: Diagnosis not present

## 2017-03-22 DIAGNOSIS — D509 Iron deficiency anemia, unspecified: Secondary | ICD-10-CM | POA: Diagnosis not present

## 2017-03-22 DIAGNOSIS — D631 Anemia in chronic kidney disease: Secondary | ICD-10-CM | POA: Diagnosis not present

## 2017-03-22 DIAGNOSIS — N186 End stage renal disease: Secondary | ICD-10-CM | POA: Diagnosis not present

## 2017-03-22 DIAGNOSIS — N2581 Secondary hyperparathyroidism of renal origin: Secondary | ICD-10-CM | POA: Diagnosis not present

## 2017-03-24 DIAGNOSIS — N2581 Secondary hyperparathyroidism of renal origin: Secondary | ICD-10-CM | POA: Diagnosis not present

## 2017-03-24 DIAGNOSIS — D509 Iron deficiency anemia, unspecified: Secondary | ICD-10-CM | POA: Diagnosis not present

## 2017-03-24 DIAGNOSIS — N186 End stage renal disease: Secondary | ICD-10-CM | POA: Diagnosis not present

## 2017-03-24 DIAGNOSIS — D631 Anemia in chronic kidney disease: Secondary | ICD-10-CM | POA: Diagnosis not present

## 2017-03-24 DIAGNOSIS — Z23 Encounter for immunization: Secondary | ICD-10-CM | POA: Diagnosis not present

## 2017-03-27 DIAGNOSIS — N186 End stage renal disease: Secondary | ICD-10-CM | POA: Diagnosis not present

## 2017-03-27 DIAGNOSIS — N2581 Secondary hyperparathyroidism of renal origin: Secondary | ICD-10-CM | POA: Diagnosis not present

## 2017-03-27 DIAGNOSIS — Z23 Encounter for immunization: Secondary | ICD-10-CM | POA: Diagnosis not present

## 2017-03-27 DIAGNOSIS — D509 Iron deficiency anemia, unspecified: Secondary | ICD-10-CM | POA: Diagnosis not present

## 2017-03-27 DIAGNOSIS — D631 Anemia in chronic kidney disease: Secondary | ICD-10-CM | POA: Diagnosis not present

## 2017-03-29 ENCOUNTER — Encounter (HOSPITAL_COMMUNITY): Payer: Self-pay | Admitting: *Deleted

## 2017-03-29 ENCOUNTER — Emergency Department (HOSPITAL_COMMUNITY): Payer: Medicare Other

## 2017-03-29 ENCOUNTER — Emergency Department (HOSPITAL_COMMUNITY)
Admission: EM | Admit: 2017-03-29 | Discharge: 2017-03-29 | Disposition: A | Discharge status: Home / Self Care | Payer: Medicare Other | Attending: Emergency Medicine | Admitting: Emergency Medicine

## 2017-03-29 DIAGNOSIS — N186 End stage renal disease: Secondary | ICD-10-CM | POA: Diagnosis not present

## 2017-03-29 DIAGNOSIS — Y9301 Activity, walking, marching and hiking: Secondary | ICD-10-CM | POA: Diagnosis not present

## 2017-03-29 DIAGNOSIS — N2581 Secondary hyperparathyroidism of renal origin: Secondary | ICD-10-CM | POA: Diagnosis not present

## 2017-03-29 DIAGNOSIS — J449 Chronic obstructive pulmonary disease, unspecified: Secondary | ICD-10-CM | POA: Diagnosis not present

## 2017-03-29 DIAGNOSIS — W01198A Fall on same level from slipping, tripping and stumbling with subsequent striking against other object, initial encounter: Secondary | ICD-10-CM | POA: Insufficient documentation

## 2017-03-29 DIAGNOSIS — Y929 Unspecified place or not applicable: Secondary | ICD-10-CM | POA: Insufficient documentation

## 2017-03-29 DIAGNOSIS — D631 Anemia in chronic kidney disease: Secondary | ICD-10-CM | POA: Diagnosis not present

## 2017-03-29 DIAGNOSIS — Z992 Dependence on renal dialysis: Secondary | ICD-10-CM | POA: Diagnosis not present

## 2017-03-29 DIAGNOSIS — Y998 Other external cause status: Secondary | ICD-10-CM | POA: Diagnosis not present

## 2017-03-29 DIAGNOSIS — Z79899 Other long term (current) drug therapy: Secondary | ICD-10-CM | POA: Diagnosis not present

## 2017-03-29 DIAGNOSIS — I120 Hypertensive chronic kidney disease with stage 5 chronic kidney disease or end stage renal disease: Secondary | ICD-10-CM | POA: Insufficient documentation

## 2017-03-29 DIAGNOSIS — S59912A Unspecified injury of left forearm, initial encounter: Secondary | ICD-10-CM | POA: Diagnosis present

## 2017-03-29 DIAGNOSIS — S52002A Unspecified fracture of upper end of left ulna, initial encounter for closed fracture: Secondary | ICD-10-CM | POA: Insufficient documentation

## 2017-03-29 DIAGNOSIS — Z23 Encounter for immunization: Secondary | ICD-10-CM | POA: Diagnosis not present

## 2017-03-29 DIAGNOSIS — Z87891 Personal history of nicotine dependence: Secondary | ICD-10-CM | POA: Diagnosis not present

## 2017-03-29 DIAGNOSIS — S52092A Other fracture of upper end of left ulna, initial encounter for closed fracture: Secondary | ICD-10-CM | POA: Diagnosis not present

## 2017-03-29 DIAGNOSIS — D509 Iron deficiency anemia, unspecified: Secondary | ICD-10-CM | POA: Diagnosis not present

## 2017-03-29 IMAGING — DX DG FOREARM 2V*L*
2 series · 2 of 2 positions shown · non-contrast
Comparison: Left wrist films from [DATE].

CLINICAL DATA: Patient fell last night and hit mid arm.

EXAM:
LEFT FOREARM - 2 VIEW

[x forearm ap left]
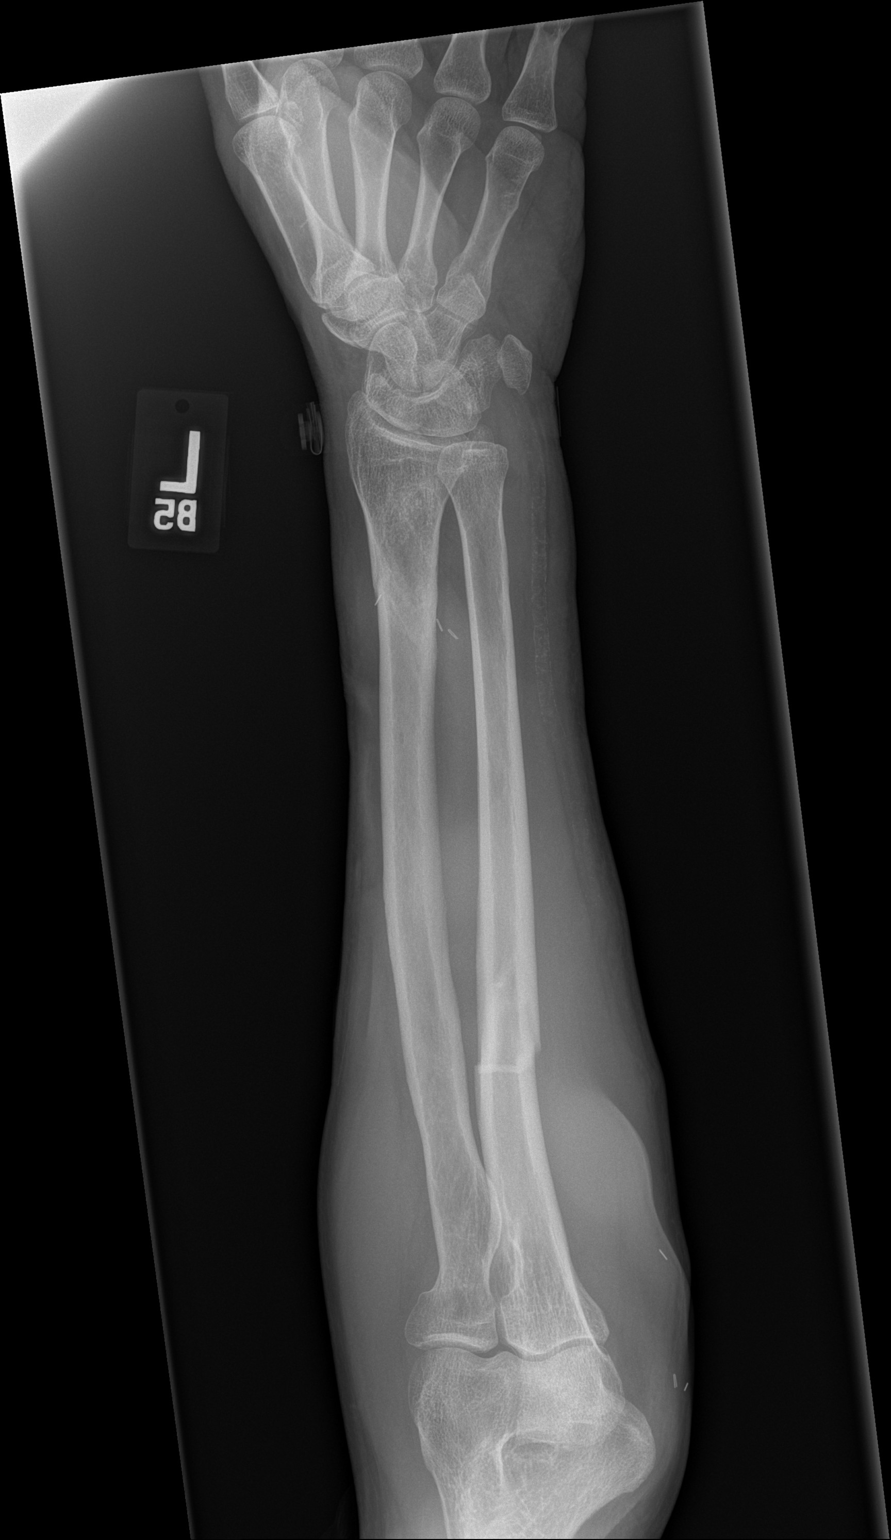

[x forearm lat left]
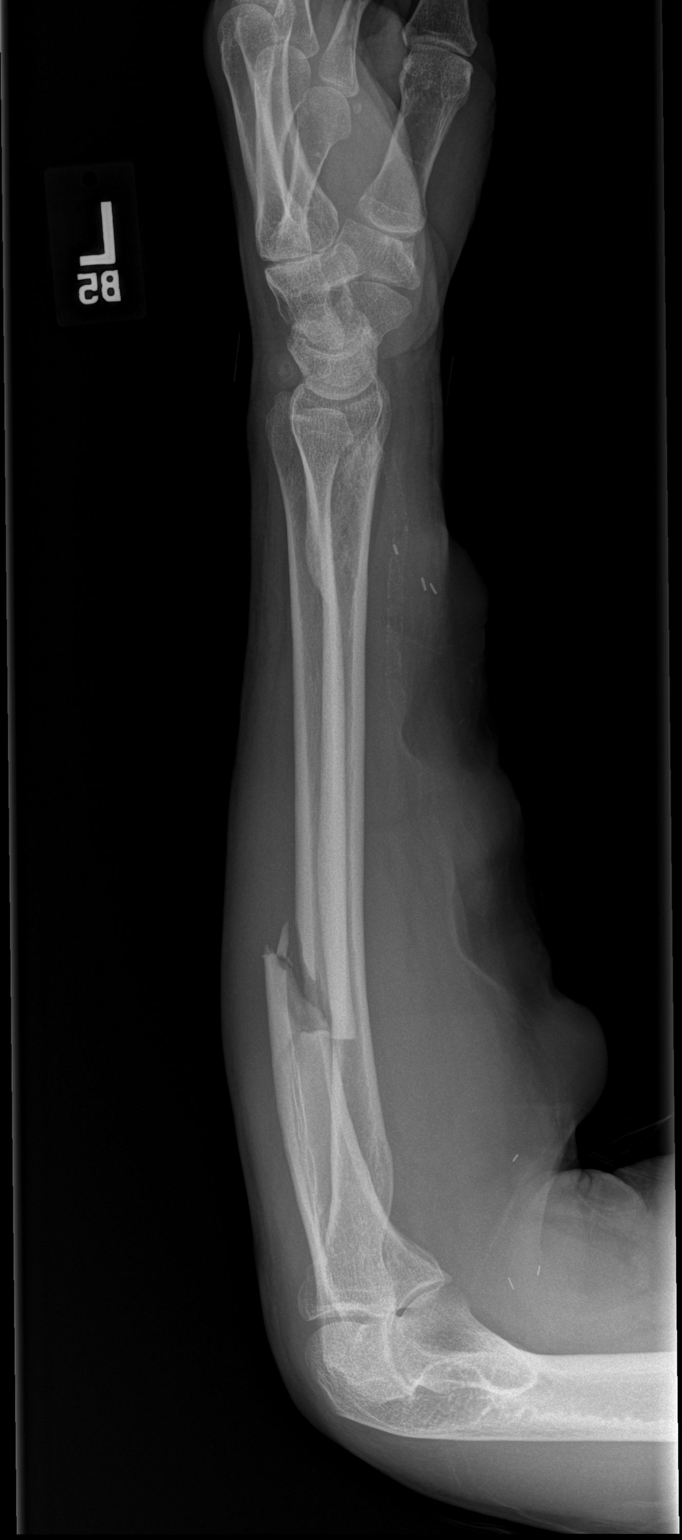

[2 of 2 positions shown; findings below may reference images not displayed]

FINDINGS: Two-view exam shows a comminuted fracture of the ulna near the
junction of the proximal and middle thirds. There is [DATE] shaft width
of anterior displacement of the distal fragment relative to the
proximal with minimal bony over riding. No associated ulnar fracture
is evident. Deformity of the distal radius is compatible with the
acute fracture seen at this location on the study from just over a
year ago.
IMPRESSION: Acute comminuted fracture of the proximal ulnar diaphysis.

## 2017-03-29 MED ORDER — OXYCODONE-ACETAMINOPHEN 5-325 MG PO TABS
1.0000 | ORAL_TABLET | Freq: Once | ORAL | Status: AC
  Administered 2017-03-29: 1 via ORAL
  Filled 2017-03-29: qty 1

## 2017-03-29 MED ORDER — OXYCODONE HCL 5 MG PO TABS: 5.0000 mg | ORAL_TABLET | Freq: Four times a day (QID) | ORAL | 0 refills | Status: DC | PRN

## 2017-03-29 NOTE — ED Provider Notes (Signed)
[place] DEPT Provider Note   CSN: [id number] Arrival date & time: 03/29/17  1134     History   Chief Complaint Chief Complaint  Patient presents with  . Fall  . Arm Pain    HPI Raymond [name] is a 61 y.o. male.  The history is provided by the patient and medical records. No language interpreter was used.  Fall   Arm Pain    Raymond [name] is a 61 y.o. male  with a PMH of ESRD on dialysis MWF who presents to the Emergency Department complaining of left forearm pain s/p mechanical fall yesterday. Patient states he was walking when he slipped and fell forward, catching himself on left forearm which caused acute onset of pain. Pain worse with any movement. This is the same arm as his fistula. He went to dialysis today and did have full treatment without any complications. No numbness or tingling. No bleeding from dialysis site. No medications prior to arrival for symptoms.   Patient scheduled for a fistulagram at [facility] tomorrow at 2pm.   Past Medical History:  Diagnosis Date  . Anemia   . ESRD (end stage renal disease) on dialysis [facility])    "MWF; [facility] Rinks" (12/05/2016)  . Hepatitis C    Still positive s/p liver biopsy at [facility]  and interferon therapy for 6 months. Most recent lab work was on 10/24/12  . Hepatitis C    "took the tx; gone now" (12/05/2016)  . History of blood transfusion ~ 2012/2013   "related to my kidneys; blood was low"  . Hypertension   . Substance abuse   . Thyroid disease     Patient Active Problem List   Diagnosis Date Noted  . Acute bronchitis   . COPD exacerbation ([place])   . End-stage renal disease on hemodialysis ([place])   . Hypoxia 12/05/2016  . Hyperkalemia 12/19/2015  . Hypoglycemia 12/19/2015  . Polysubstance abuse 12/19/2015  . Homelessness 12/19/2015  . Anemia associated with stage 5 chronic renal failure ([place]) 12/19/2015  . Chest pain 12/19/2013  . Pain in limb 12/04/2012  . End stage renal disease ([place]) 12/04/2012    . HEPATITIS C 09/07/2006  . HYPERCHOLESTEROLEMIA 09/07/2006  . HYPERTENSION, BENIGN SYSTEMIC 09/07/2006    Past Surgical History:  Procedure Laterality Date  . AV FISTULA PLACEMENT Left Aug. 2013 ?  [name] LIVER BIOPSY    . SHUNTOGRAM N/A 12/11/2012   Procedure: Earney Mallet;  Surgeon: Serafina Mitchell, MD;  Location: [facility] CATH LAB;  Service: Cardiovascular;  Laterality: N/A;       Home Medications    Prior to Admission medications   Medication Sig Start Date End Date Taking? Authorizing Provider  albuterol (PROVENTIL HFA;VENTOLIN HFA) 108 (90 Base) MCG/ACT inhaler Inhale 2 puffs into the lungs every 6 (six) hours as needed for wheezing or shortness of breath. 12/06/16   Lorella Nimrod, MD  amLODipine (NORVASC) 5 MG tablet Take 5 mg by mouth daily.    [provider]  calcitRIOL (ROCALTROL) 0.25 MCG capsule Take 7 capsules (1.75 mcg total) by mouth every Monday, Wednesday, and Friday with hemodialysis. 12/07/16   Lorella Nimrod, MD  cinacalcet (SENSIPAR) 30 MG tablet Take 1 tablet (30 mg total) by mouth daily with supper. 12/21/15   Arrien, Jimmy Picket, MD  folic acid (FOLVITE) 1 MG tablet Take 1 tablet (1 mg total) by mouth daily. 12/07/16   Lorella Nimrod, MD  gabapentin (NEURONTIN) 300 MG capsule Take 300 mg by mouth at  bedtime as needed (sleep).    [provider]  guaiFENesin (MUCINEX) 600 MG 12 hr tablet Take 1 tablet (600 mg total) by mouth 2 (two) times daily. 12/06/16   Lorella Nimrod, MD  HYDROcodone-acetaminophen (NORCO/VICODIN) 5-325 MG tablet Take 1 tablet by mouth every 6 (six) hours as needed for moderate pain. 02/16/16   Lawyer, Harrell Gave, PA-C  metoprolol succinate (TOPROL-XL) 25 MG 24 hr tablet Take 2 tablets (50 mg total) by mouth daily. 12/21/15   Arrien, Jimmy Picket, MD  Multiple Vitamin (MULTIVITAMIN WITH MINERALS) TABS tablet Take 1 tablet by mouth daily. 12/07/16   Lorella Nimrod, MD  oxyCODONE (OXY IR/ROXICODONE) 5 MG immediate release tablet Take 1 tablet  (5 mg total) by mouth every 6 (six) hours as needed for severe pain. 03/29/17   Kampbell Holaway, Ozella Almond, PA-C  predniSONE (DELTASONE) 20 MG tablet Take 2 tablets (40 mg total) by mouth daily with breakfast. 12/07/16   Lorella Nimrod, MD  sevelamer (RENAGEL) 800 MG tablet Take 800 mg by mouth 3 (three) times daily with meals.    [provider]  thiamine (VITAMIN B-1) 100 MG tablet Take 100 mg by mouth daily.    [provider]  vitamin B-12 (CYANOCOBALAMIN) 1000 MCG tablet Take 1,000 mcg by mouth daily.    [provider]    Family History Family History  Problem Relation Age of Onset  . Diabetes Father   . Hypertension Father     Social History Social History  Substance Use Topics  . Smoking status: Former Smoker    Packs/day: 1.00    Years: 25.00    Types: Cigarettes    Quit date: 08/01/2011  . Smokeless tobacco: Never Used  . Alcohol use 4.8 oz/week    8 Glasses of wine per week     Comment: 12/05/2016 "~ 1 pint/day; twice/week; wine; no liquor"     Allergies   Patient has no known allergies.   Review of Systems Review of Systems  Musculoskeletal: Positive for arthralgias.  Neurological: Negative for weakness and numbness.  All other systems reviewed and are negative.    Physical Exam Updated Vital Signs BP (!) 168/74 (BP Location: Right Arm)   Pulse 87   Temp 98 F (36.7 C) (Oral)   Resp 20   SpO2 100%   Physical Exam  Constitutional: He appears well-developed and well-nourished. No distress.  HENT:  Head: Normocephalic and atraumatic.  Neck: Neck supple.  Cardiovascular: Normal rate, regular rhythm and normal heart sounds.   No murmur heard. Fistula to left upper extremity with palpable thrill and good bruit.  Pulmonary/Chest: Effort normal and breath sounds normal. No respiratory distress. He has no wheezes. He has no rales.  Musculoskeletal: Normal range of motion.       Arms: Tenderness to palpation of left forearm as depicted in  image. Limited ROM 2/2 pain. 2+ radial pulse. Sensation intact to radial, ulnar and median nerve distributions. Strong grip strength.  Neurological: He is alert.  Skin: Skin is warm and dry.  Nursing note and vitals reviewed.    ED Treatments / Results  Labs (all labs ordered are listed, but only abnormal results are displayed) Labs Reviewed - No data to display  EKG  EKG Interpretation None       Radiology Dg Forearm Left  Result Date: 03/29/2017 CLINICAL DATA:  Patient fell last night and hit mid arm. EXAM: LEFT FOREARM - 2 VIEW COMPARISON:  Left wrist films from 10/12/2016. FINDINGS: Two-view exam shows  a comminuted fracture of the ulna near the junction of the proximal and middle thirds. There is 1/2 shaft width of anterior displacement of the distal fragment relative to the proximal with minimal bony over riding. No associated ulnar fracture is evident. Deformity of the distal radius is compatible with the acute fracture seen at this location on the study from just over a year ago. IMPRESSION: Acute comminuted fracture of the proximal ulnar diaphysis. Electronically Signed   By: Misty Stanley M.D.   On: 03/29/2017 13:08    Procedures Procedures (including critical care time)  CRITICAL CARE Performed by: Ozella Almond Bradin Mcadory   Total critical care time: 35 minutes  Critical care time was exclusive of separately billable procedures and treating other patients.  Critical care was necessary to treat or prevent imminent or limb/life-threatening deterioration.  Critical care was time spent personally by me on the following activities: development of treatment plan with patient and/or surrogate as well as nursing, discussions with multiple consultants, evaluation of patient's response to treatment, examination of patient, obtaining history from patient or surrogate, ordering and performing treatments and interventions, ordering and review of radiographic studies, pulse oximetry and  re-evaluation of patient's condition.  Medications Ordered in ED Medications  oxyCODONE-acetaminophen (PERCOCET/ROXICET) 5-325 MG per tablet 1 tablet (1 tablet Oral Given 03/29/17 1629)     Initial Impression / Assessment and Plan / ED Course  I have reviewed the triage vital signs and the nursing notes.  Pertinent labs & imaging results that were available during my care of the patient were reviewed by me and considered in my medical decision making (see chart for details).    Raymond [name] is a 61 y.o. male who presents to ED for left arm pain. X-ray reviewed showing a comminuted, displaced fracture of the proximal ulna. Of note, patient is a dialysis patient with fistula quite near the fracture. Patient did go to dialysis prior to ER arrival and completed dialysis treatment without any difficulty. On exam today, there is a strong palpable thrill and good bruit. Left upper extremity is neurovascularly intact. Orthopedic hand surgery consulted who has come to evaluate the patient. Given the complexity of injury and the fistula site, orthopedics would like to avoid surgery and recommends placement in splint with a window to access fistula. Splint was placed and patient reevaluated following splint placement. He still has palpable thrill and is neurovascularly intact. Case discussed with nephrology, Dr. Augustin Coupe,  as patient is scheduled for a fistulogram tomorrow. Per nephrology, they will cancel fistulogram and have patient follow up in approximately 2 weeks. Nephrology clinic will call patient to reschedule. Patient safe for discharge from all consultants stand point. Patient re-evaluated prior to discharge. He understands follow up / home care / return precautions. All questions answered.    Final Clinical Impressions(s) / ED Diagnoses   Final diagnoses:  Closed fracture of proximal end of left ulna, unspecified fracture morphology, initial encounter    New Prescriptions Discharge Medication  List as of 03/29/2017  5:23 PM    START taking these medications   Details  oxyCODONE (OXY IR/ROXICODONE) 5 MG immediate release tablet Take 1 tablet (5 mg total) by mouth every 6 (six) hours as needed for severe pain., Starting Wed 03/29/2017, Print         Deandrea Rion, Ozella Almond, PA-C 03/29/17 1823    Daleen Bo, MD 03/30/17 [phone]

## 2017-03-29 NOTE — Discharge Instructions (Addendum)
No lifting, pushing, or pulling with left arm. Keep splint intact and dry.  The vascular center will call you to reschedule your procedure tomorrow. Most likely, you will undergo this procedure in 2 weeks.  Please call the orthopedist listed to schedule a follow up appointment in 1 week.   Pain medication only as needed for severe pain - This can make you very drowsy - please do not drink alcohol, operate heavy machinery or drive on this medication.   Return to ER for new or worsening symptoms, any additional concerns.

## 2017-03-29 NOTE — ED Triage Notes (Signed)
Pt reports slipping and falling yesterday evening. Having left forearm pain since the fall. His dialysis access is also in left arm but pt went went for dialysis this am and reports no complications with his access.

## 2017-03-29 NOTE — Progress Notes (Signed)
Orthopedic Tech Progress Note Patient Details:  Raymond [name] 07-05-1956 [id number]  Ortho Devices Type of Ortho Device: Arm sling, Long arm splint Ortho Device/Splint Location: lue Ortho Device/Splint Interventions: Application   Raymond [name] 03/29/2017, 4:22 PM

## 2017-03-29 NOTE — Consult Note (Signed)
Reason for Consult:Left ulna fx Referring Physician: E Arch Raymond [name] is an 61 y.o. male.  HPI: Raymond [name] was walking down his steps yesterday when he slipped and fell hurting his left arm. He was scheduled for HD and went ahead to that appt. He came to ED today for evaluation of the arm and x-rays showed a left ulna fx and orthopedic surgery was consulted. He c/o severe pain in that forearm.  Past Medical History:  Diagnosis Date  . Anemia   . ESRD (end stage renal disease) on dialysis [facility])    "MWF; Jeneen Rinks" (12/05/2016)  . Hepatitis C    Still positive s/p liver biopsy at [facility]  and interferon therapy for 6 months. Most recent lab work was on 10/24/12  . Hepatitis C    "took the tx; gone now" (12/05/2016)  . History of blood transfusion ~ 2012/2013   "related to my kidneys; blood was low"  . Hypertension   . Substance abuse   . Thyroid disease     Past Surgical History:  Procedure Laterality Date  . AV FISTULA PLACEMENT Left Aug. 2013 ?  [name] LIVER BIOPSY    . SHUNTOGRAM N/A 12/11/2012   Procedure: Earney Mallet;  Surgeon: Serafina Mitchell, MD;  Location: [facility] CATH LAB;  Service: Cardiovascular;  Laterality: N/A;    Family History  Problem Relation Age of Onset  . Diabetes Father   . Hypertension Father     Social History:  reports that he quit smoking about 5 years ago. His smoking use included Cigarettes. He has a 25.00 pack-year smoking history. He has never used smokeless tobacco. He reports that he drinks about 4.8 oz of alcohol per week . He reports that he uses drugs, including Cocaine and Marijuana.  Allergies: No Known Allergies  Medications: I have reviewed the patient's current medications.  No results found for this or any previous visit (from the past 48 hour(s)).  Dg Forearm Left  Result Date: 03/29/2017 CLINICAL DATA:  Patient fell last night and hit mid arm. EXAM: LEFT FOREARM - 2 VIEW COMPARISON:  Left wrist films from 10/12/2016. FINDINGS: Two-view exam  shows a comminuted fracture of the ulna near the junction of the proximal and middle thirds. There is 1/2 shaft width of anterior displacement of the distal fragment relative to the proximal with minimal bony over riding. No associated ulnar fracture is evident. Deformity of the distal radius is compatible with the acute fracture seen at this location on the study from just over a year ago. IMPRESSION: Acute comminuted fracture of the proximal ulnar diaphysis. Electronically Signed   By: Misty Stanley M.D.   On: 03/29/2017 13:08    Review of Systems  Constitutional: Negative for weight loss.  HENT: Negative for ear discharge, ear pain, hearing loss and tinnitus.   Eyes: Negative for blurred vision, double vision, photophobia and pain.  Respiratory: Negative for cough, sputum production and shortness of breath.   Cardiovascular: Negative for chest pain.  Gastrointestinal: Negative for abdominal pain, nausea and vomiting.  Genitourinary: Negative for dysuria, flank pain, frequency and urgency.  Musculoskeletal: Positive for joint pain (Left forearm). Negative for back pain, falls, myalgias and neck pain.  Neurological: Negative for dizziness, tingling, sensory change, focal weakness, loss of consciousness and headaches.  Endo/Heme/Allergies: Does not bruise/bleed easily.  Psychiatric/Behavioral: Negative for depression, memory loss and substance abuse. The patient is not nervous/anxious.    Blood pressure (!) 151/105, pulse (!) 59, temperature 97.7 F (36.5 C),  temperature source Oral, resp. rate 14, SpO2 100 %. Physical Exam  Constitutional: He appears well-developed and well-nourished. No distress.  HENT:  Head: Normocephalic.  Eyes: Conjunctivae are normal. Right eye exhibits no discharge. Left eye exhibits no discharge. No scleral icterus.  Cardiovascular: Normal rate and regular rhythm.   Respiratory: Effort normal. No respiratory distress.  Musculoskeletal:  Left shoulder, elbow, wrist,  digits- no skin wounds, severe TTP forearm, no instability, no blocks to motion but limited 2/2 pain, dialysis fistula present, thrill present  Sens  Ax/R/M/U intact  Mot   Ax/ R/ PIN/ M/ AIN/ U intact  Rad 2+   Neurological: He is alert.  Skin: Skin is warm and dry. He is not diaphoretic.  Psychiatric: He has a normal mood and affect. His behavior is normal.    Assessment/Plan: Left ulna fx -- Would like to avoid ORIF in arm with active fistula. Will place long arm splint with window to access fistula. He should f/u with Dr. Caralyn Guile next week. NWB in sling. Ok to discharge from orthopedic standpoint.    Lisette Abu, PA-C Orthopedic Surgery [phone] 03/29/2017, 3:46 PM

## 2017-03-31 DIAGNOSIS — N186 End stage renal disease: Secondary | ICD-10-CM | POA: Diagnosis not present

## 2017-03-31 DIAGNOSIS — D631 Anemia in chronic kidney disease: Secondary | ICD-10-CM | POA: Diagnosis not present

## 2017-03-31 DIAGNOSIS — D509 Iron deficiency anemia, unspecified: Secondary | ICD-10-CM | POA: Diagnosis not present

## 2017-03-31 DIAGNOSIS — Z23 Encounter for immunization: Secondary | ICD-10-CM | POA: Diagnosis not present

## 2017-03-31 DIAGNOSIS — N2581 Secondary hyperparathyroidism of renal origin: Secondary | ICD-10-CM | POA: Diagnosis not present

## 2017-04-04 DIAGNOSIS — S52252A Displaced comminuted fracture of shaft of ulna, left arm, initial encounter for closed fracture: Secondary | ICD-10-CM | POA: Diagnosis not present

## 2017-04-05 DIAGNOSIS — Z23 Encounter for immunization: Secondary | ICD-10-CM | POA: Diagnosis not present

## 2017-04-05 DIAGNOSIS — D631 Anemia in chronic kidney disease: Secondary | ICD-10-CM | POA: Diagnosis not present

## 2017-04-05 DIAGNOSIS — N186 End stage renal disease: Secondary | ICD-10-CM | POA: Diagnosis not present

## 2017-04-05 DIAGNOSIS — N2581 Secondary hyperparathyroidism of renal origin: Secondary | ICD-10-CM | POA: Diagnosis not present

## 2017-04-05 DIAGNOSIS — D509 Iron deficiency anemia, unspecified: Secondary | ICD-10-CM | POA: Diagnosis not present

## 2017-04-07 DIAGNOSIS — Z23 Encounter for immunization: Secondary | ICD-10-CM | POA: Diagnosis not present

## 2017-04-07 DIAGNOSIS — D631 Anemia in chronic kidney disease: Secondary | ICD-10-CM | POA: Diagnosis not present

## 2017-04-07 DIAGNOSIS — N186 End stage renal disease: Secondary | ICD-10-CM | POA: Diagnosis not present

## 2017-04-07 DIAGNOSIS — N2581 Secondary hyperparathyroidism of renal origin: Secondary | ICD-10-CM | POA: Diagnosis not present

## 2017-04-07 DIAGNOSIS — D509 Iron deficiency anemia, unspecified: Secondary | ICD-10-CM | POA: Diagnosis not present

## 2017-04-08 ENCOUNTER — Encounter (HOSPITAL_COMMUNITY): Payer: Self-pay | Admitting: Emergency Medicine

## 2017-04-08 ENCOUNTER — Other Ambulatory Visit: Payer: Self-pay

## 2017-04-08 DIAGNOSIS — Z79899 Other long term (current) drug therapy: Secondary | ICD-10-CM | POA: Diagnosis not present

## 2017-04-08 DIAGNOSIS — I120 Hypertensive chronic kidney disease with stage 5 chronic kidney disease or end stage renal disease: Secondary | ICD-10-CM | POA: Diagnosis not present

## 2017-04-08 DIAGNOSIS — R0789 Other chest pain: Secondary | ICD-10-CM | POA: Diagnosis not present

## 2017-04-08 DIAGNOSIS — R079 Chest pain, unspecified: Secondary | ICD-10-CM | POA: Diagnosis not present

## 2017-04-08 DIAGNOSIS — N186 End stage renal disease: Secondary | ICD-10-CM | POA: Insufficient documentation

## 2017-04-08 DIAGNOSIS — Z87891 Personal history of nicotine dependence: Secondary | ICD-10-CM | POA: Insufficient documentation

## 2017-04-08 DIAGNOSIS — J449 Chronic obstructive pulmonary disease, unspecified: Secondary | ICD-10-CM | POA: Diagnosis not present

## 2017-04-08 DIAGNOSIS — R0602 Shortness of breath: Secondary | ICD-10-CM | POA: Diagnosis not present

## 2017-04-08 DIAGNOSIS — M79602 Pain in left arm: Secondary | ICD-10-CM | POA: Diagnosis not present

## 2017-04-08 DIAGNOSIS — Z992 Dependence on renal dialysis: Secondary | ICD-10-CM | POA: Diagnosis not present

## 2017-04-08 DIAGNOSIS — R52 Pain, unspecified: Secondary | ICD-10-CM | POA: Diagnosis not present

## 2017-04-08 DIAGNOSIS — I700 Atherosclerosis of aorta: Secondary | ICD-10-CM | POA: Diagnosis not present

## 2017-04-08 MED ORDER — OXYCODONE-ACETAMINOPHEN 5-325 MG PO TABS
ORAL_TABLET | ORAL | Status: AC
  Filled 2017-04-08: qty 1

## 2017-04-08 MED ORDER — OXYCODONE-ACETAMINOPHEN 5-325 MG PO TABS
1.0000 | ORAL_TABLET | ORAL | Status: DC | PRN
  Administered 2017-04-08: 1 via ORAL
  Filled 2017-04-08: qty 1

## 2017-04-08 NOTE — ED Triage Notes (Signed)
Brought by ems from home.  C/O central chest pain that started earlier today and pain in left arm from previous fracture.  Reports that he went to ortho Tuesday but they were unsure what to do with arm due to AV fistula being in the left arm.

## 2017-04-09 ENCOUNTER — Emergency Department (HOSPITAL_COMMUNITY): Payer: Medicare Other

## 2017-04-09 ENCOUNTER — Emergency Department (HOSPITAL_COMMUNITY)
Admission: EM | Admit: 2017-04-09 | Discharge: 2017-04-09 | Disposition: A | Discharge status: Home / Self Care | Payer: Medicare Other | Attending: Emergency Medicine | Admitting: Emergency Medicine

## 2017-04-09 DIAGNOSIS — I700 Atherosclerosis of aorta: Secondary | ICD-10-CM | POA: Diagnosis not present

## 2017-04-09 DIAGNOSIS — R079 Chest pain, unspecified: Secondary | ICD-10-CM | POA: Diagnosis not present

## 2017-04-09 DIAGNOSIS — R0602 Shortness of breath: Secondary | ICD-10-CM | POA: Diagnosis not present

## 2017-04-09 DIAGNOSIS — R0789 Other chest pain: Secondary | ICD-10-CM

## 2017-04-09 DIAGNOSIS — N2889 Other specified disorders of kidney and ureter: Secondary | ICD-10-CM | POA: Diagnosis not present

## 2017-04-09 DIAGNOSIS — N186 End stage renal disease: Secondary | ICD-10-CM | POA: Diagnosis not present

## 2017-04-09 DIAGNOSIS — Z992 Dependence on renal dialysis: Secondary | ICD-10-CM | POA: Diagnosis not present

## 2017-04-09 LAB — BASIC METABOLIC PANEL
Anion gap: 14 (ref 5–15)
BUN: 33 mg/dL — AB (ref 6–20)
CALCIUM: 8.2 mg/dL — AB (ref 8.9–10.3)
CO2: 25 mmol/L (ref 22–32)
CREATININE: 11.24 mg/dL — AB (ref 0.61–1.24)
Chloride: 92 mmol/L — ABNORMAL LOW (ref 101–111)
GFR calc Af Amer: 5 mL/min — ABNORMAL LOW (ref >60)
GFR calc non Af Amer: 4 mL/min — ABNORMAL LOW (ref >60)
GLUCOSE: 83 mg/dL (ref 65–99)
Potassium: 4.2 mmol/L (ref 3.5–5.1)
Sodium: 131 mmol/L — ABNORMAL LOW (ref 135–145)

## 2017-04-09 LAB — CBC
HCT: 33.8 % — ABNORMAL LOW (ref 39.0–52.0)
HEMOGLOBIN: 10.9 g/dL — AB (ref 13.0–17.0)
MCH: 33.7 pg (ref 26.0–34.0)
MCHC: 32.2 g/dL (ref 30.0–36.0)
MCV: 104.6 fL — ABNORMAL HIGH (ref 78.0–100.0)
Platelets: 134 10*3/uL — ABNORMAL LOW (ref 150–400)
RBC: 3.23 MIL/uL — ABNORMAL LOW (ref 4.22–5.81)
RDW: 16 % — ABNORMAL HIGH (ref 11.5–15.5)
WBC: 3.1 10*3/uL — ABNORMAL LOW (ref 4.0–10.5)

## 2017-04-09 LAB — I-STAT TROPONIN, ED
TROPONIN I, POC: 0.02 ng/mL (ref 0.00–0.08)
Troponin i, poc: 0.02 ng/mL (ref 0.00–0.08)

## 2017-04-09 IMAGING — CR DG CHEST 2V
2 series · 2 of 2 positions shown · non-contrast
Comparison: [DATE] CXR and chest CT

CLINICAL DATA: Central chest pain tonight. History of smoking and
hypertension. Dialysis patient.

EXAM:
CHEST  2 VIEW

[chest pa]
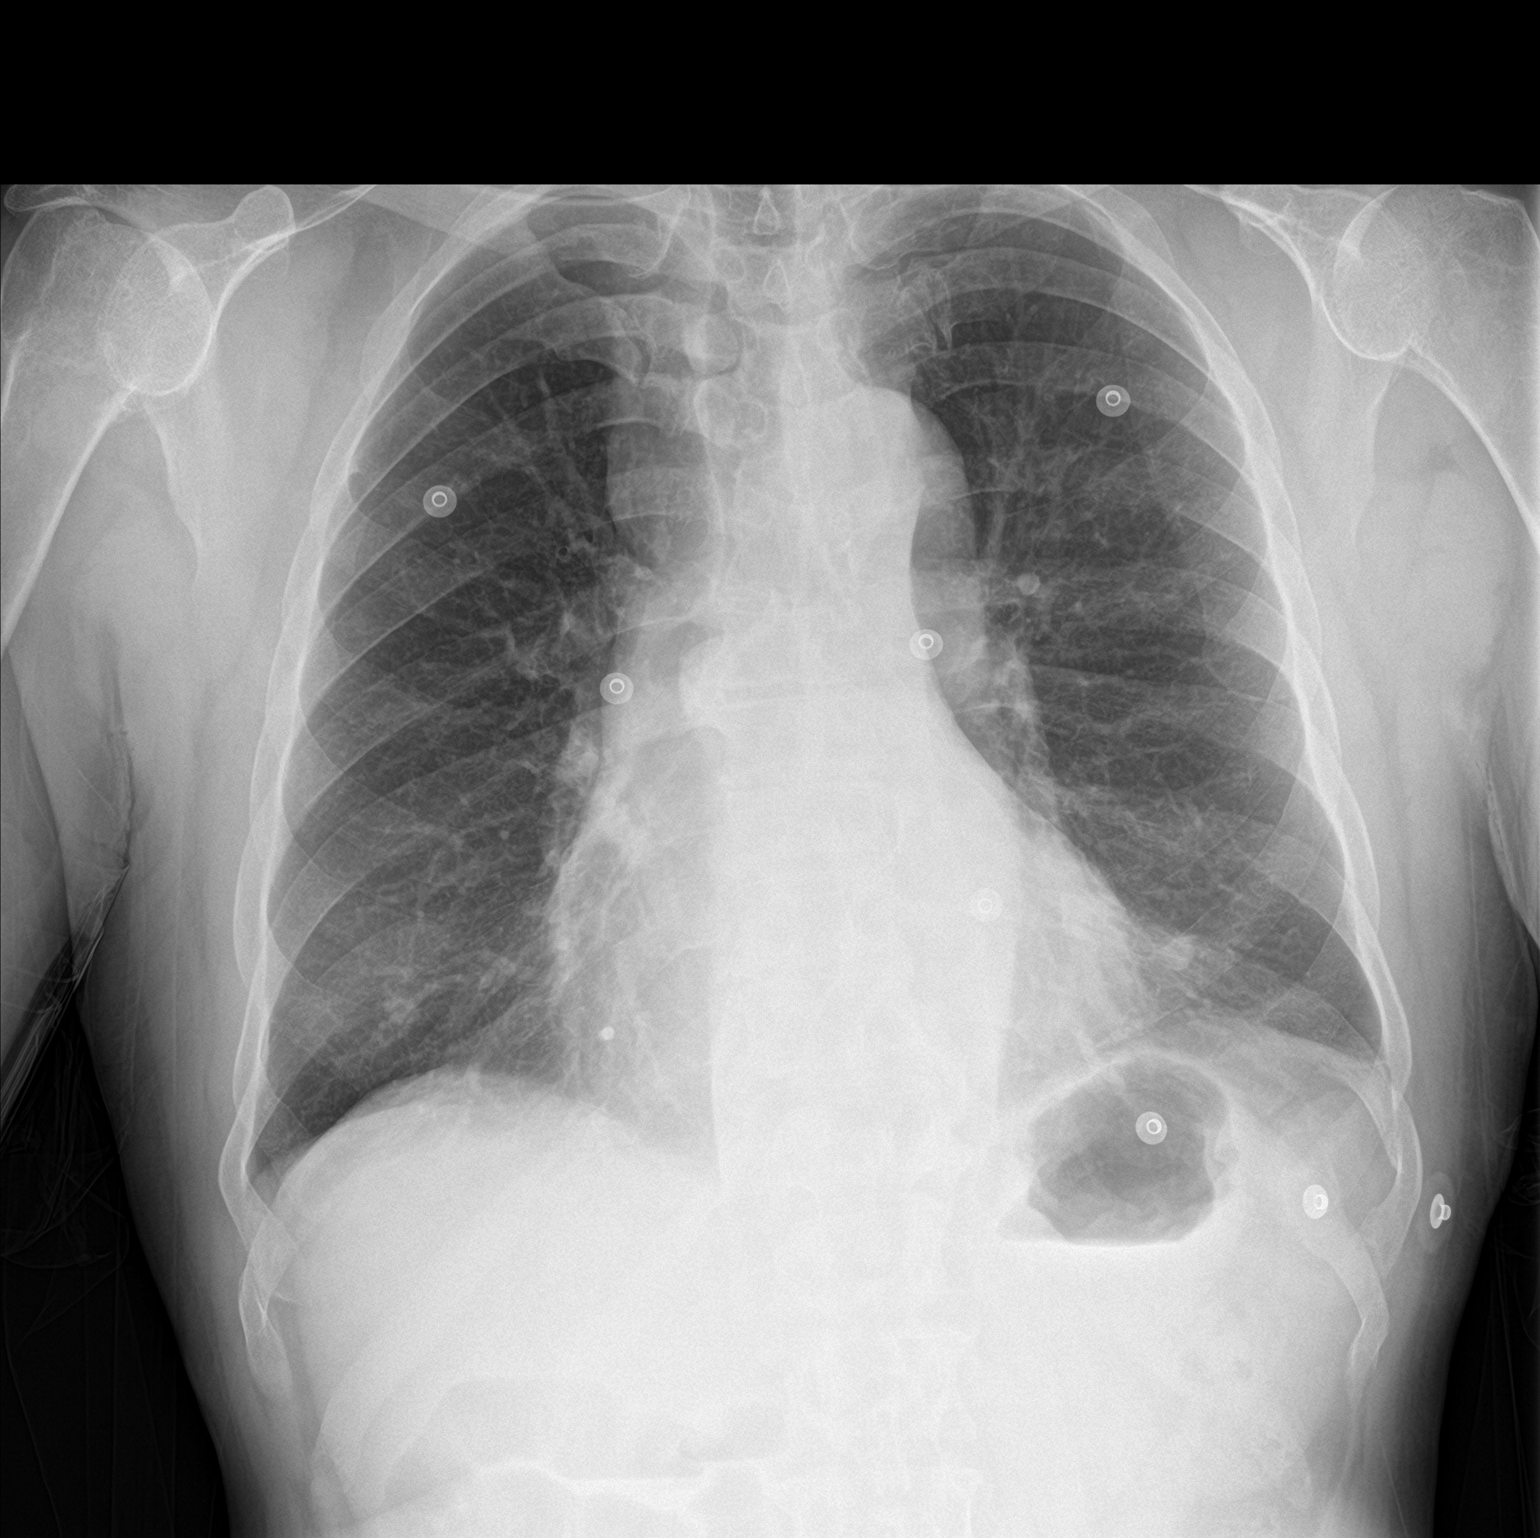

[chest lat]
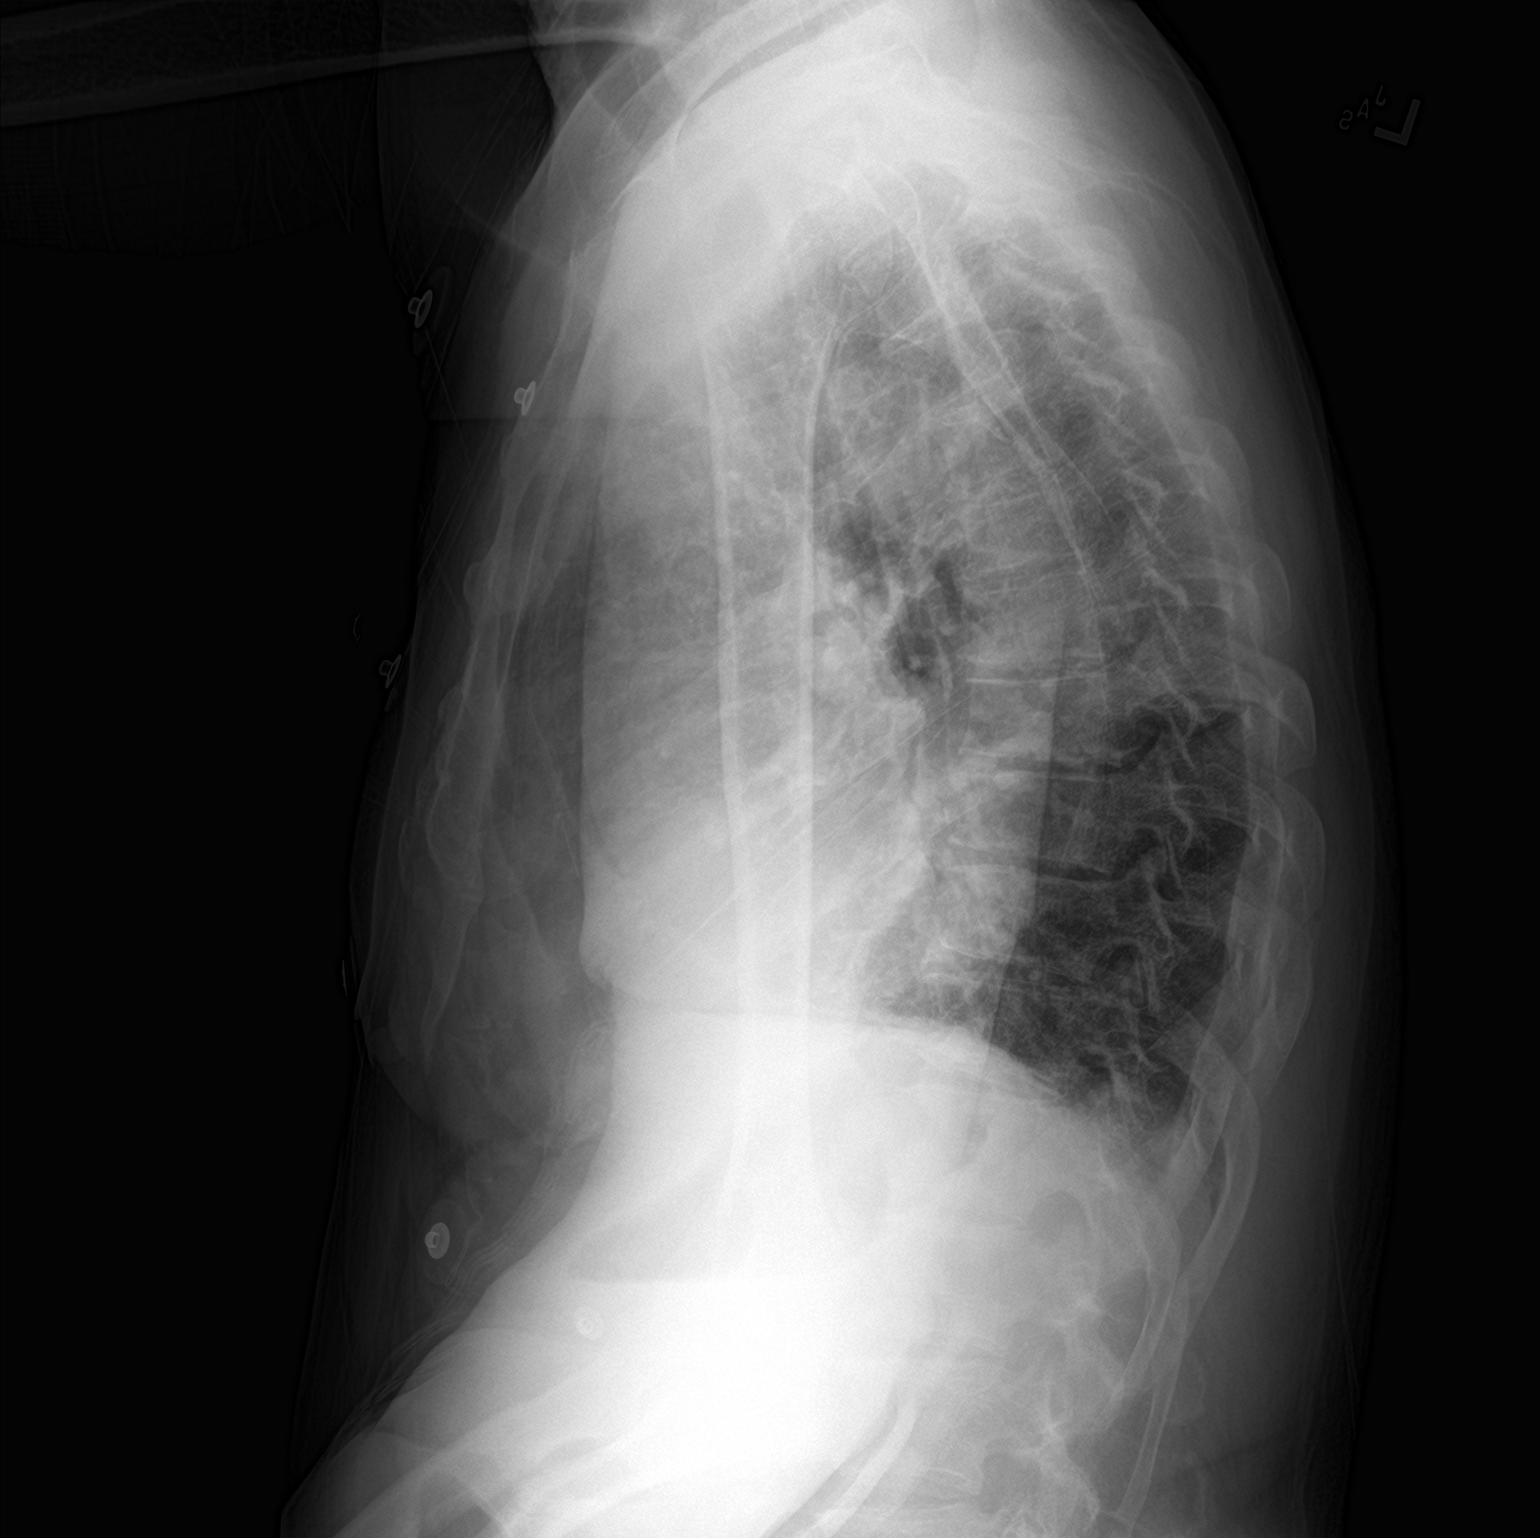

[2 of 2 positions shown; findings below may reference images not displayed]

FINDINGS: Borderline cardiomegaly with tortuous thoracic aorta. No aneurysm.
No pneumonic consolidation, effusion or pulmonary edema. Nipple
shadows accounting for bilateral lower lobe nodular densities. No
acute nor suspicious osseous abnormality.
IMPRESSION: Stable appearance of the chest without acute cardiopulmonary
disease. Borderline cardiomegaly.

## 2017-04-09 IMAGING — CT CT ANGIO CHEST
2 of 6 series · 18 of 36 positions shown · IV contrast (Omni 300)
Comparison: [DATE] and [DATE]

CLINICAL DATA: Ct chest PE 100ml ISO 370, Dialysis patient, recent
fracture of ulna, new chest pressure and SOB, eval for PE or fat
embolus

EXAM:
CT ANGIOGRAPHY CHEST WITH CONTRAST
TECHNIQUE: Multidetector CT imaging of the chest was performed using the
standard protocol during bolus administration of intravenous
contrast. Multiplanar CT image reconstructions and MIPs were
obtained to evaluate the vascular anatomy.
CONTRAST:  100 cc Omnipaque 370

[Series 7: pe thins · axial · 0.67mm/px · z∈[+971,+1231]mm · 17 of 294 slices shown]
[im 17/294  lung]
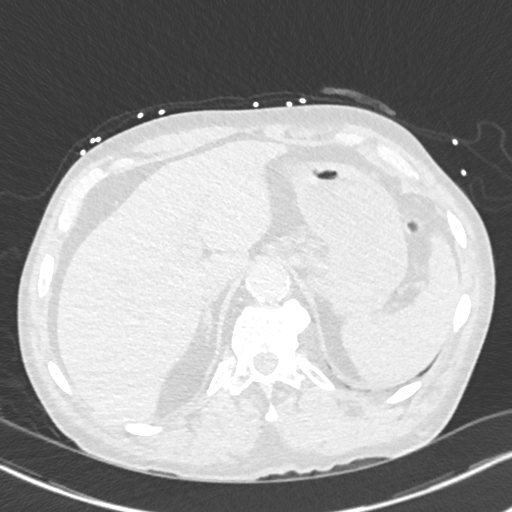
[im 33/294  mediastinal]
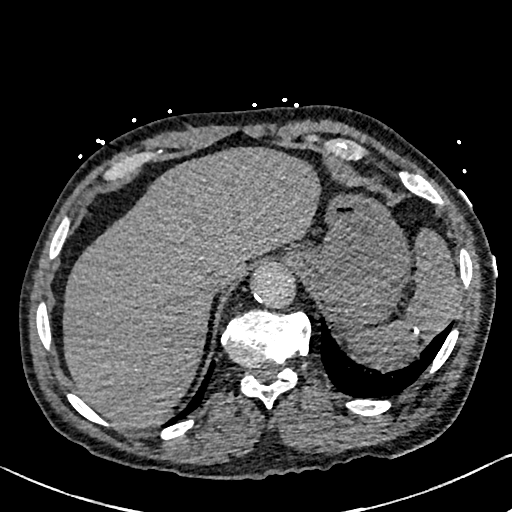
[im 49/294  lung]
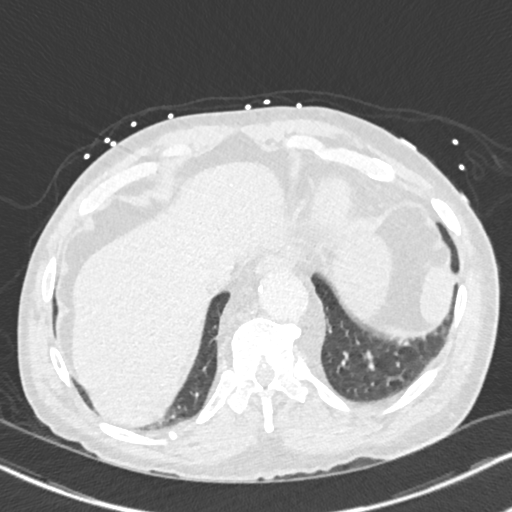
[im 66/294  mediastinal]
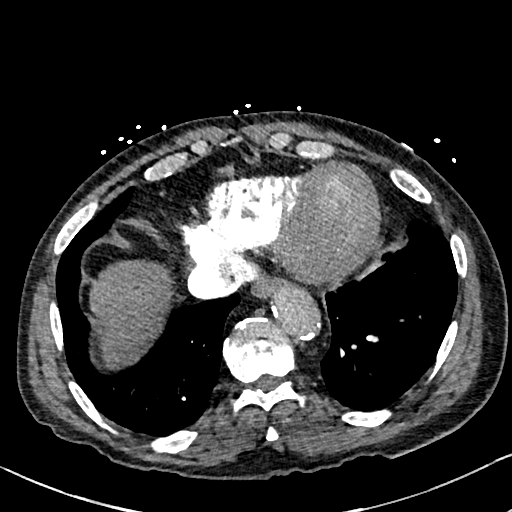
[im 82/294  lung]
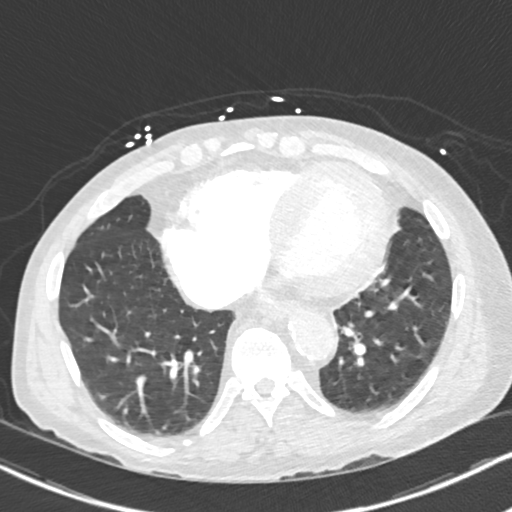
[im 98/294  mediastinal]
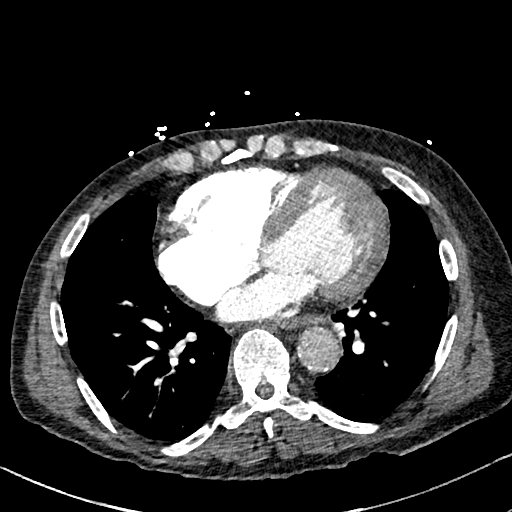
[im 114/294  lung]
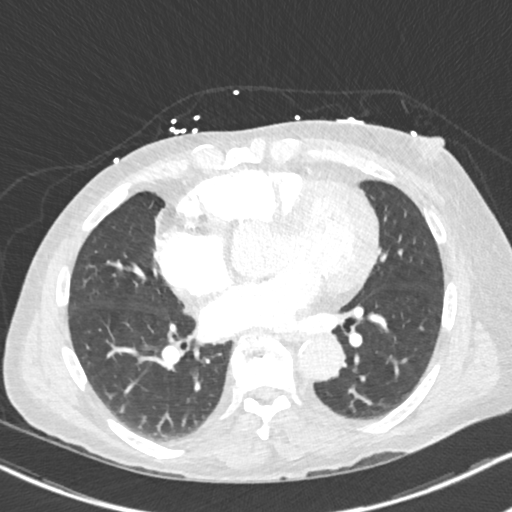
[im 131/294  mediastinal]
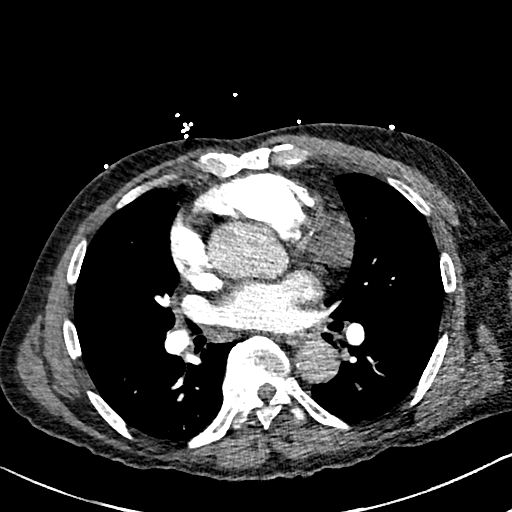
[im 147/294  lung]
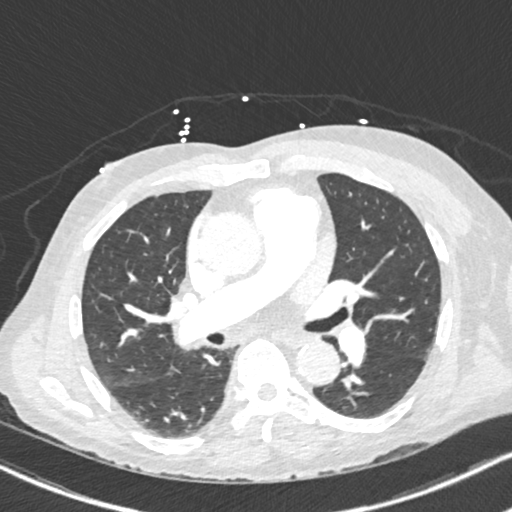
[im 163/294  mediastinal]
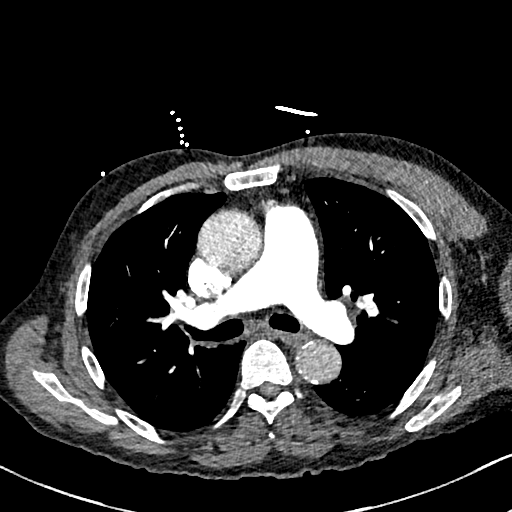
[im 180/294  lung]
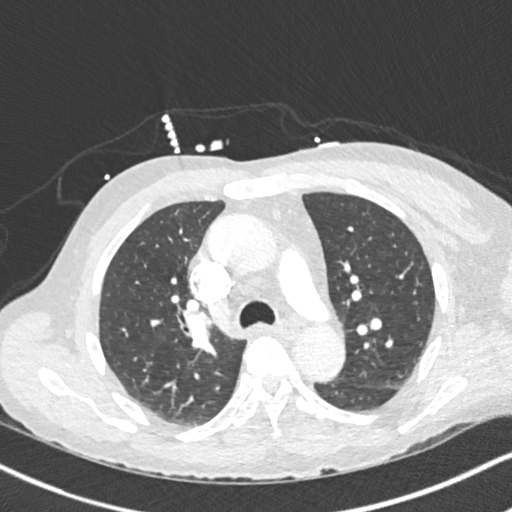
[im 196/294  mediastinal]
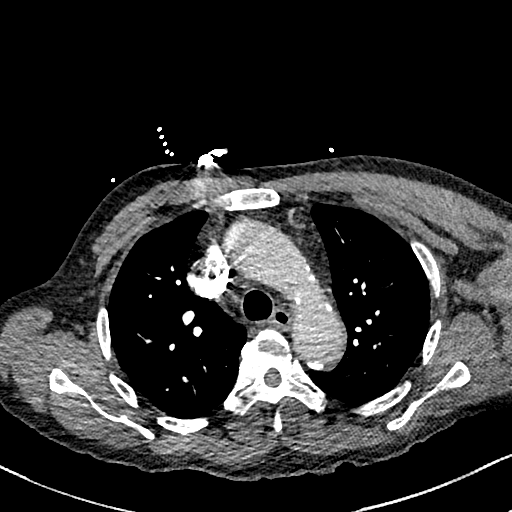
[im 212/294  lung]
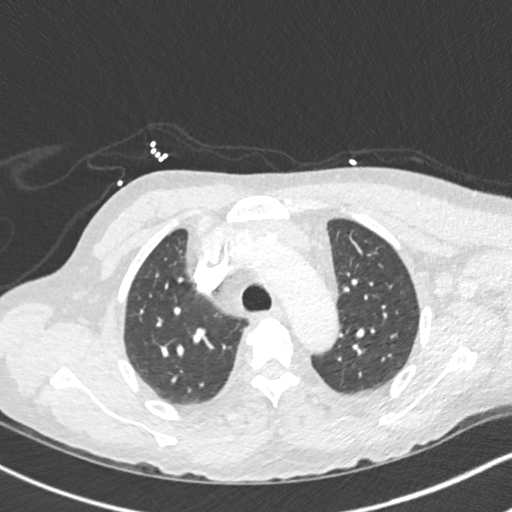
[im 228/294  mediastinal]
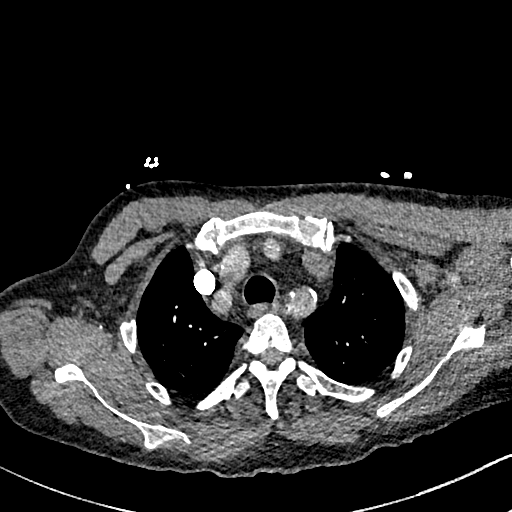
[im 245/294  lung]
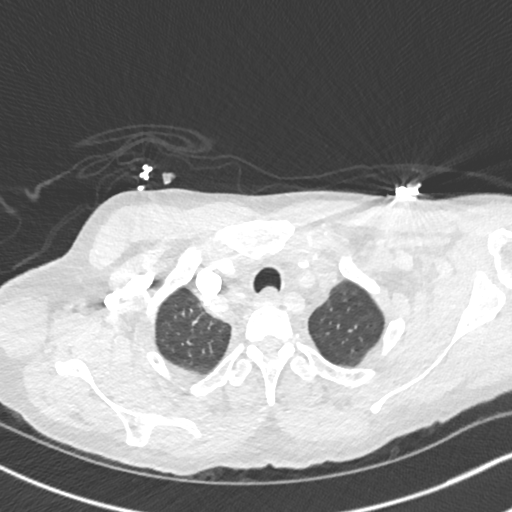
[im 261/294  mediastinal]
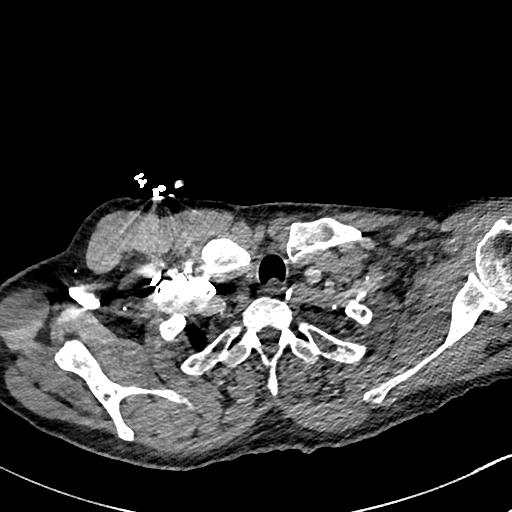
[im 277/294  lung]
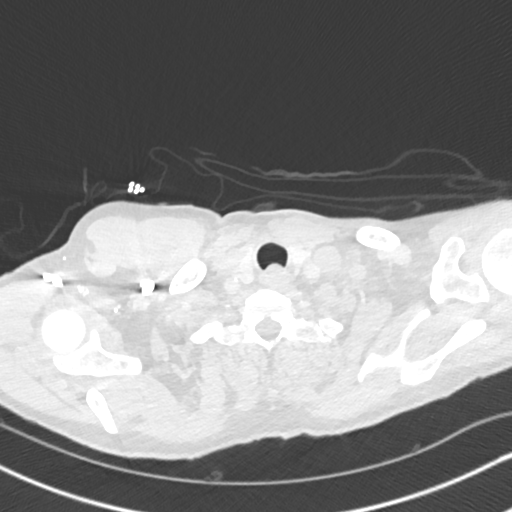

[Series 8: pe 2mm cor · coronal · 0.59mm/px · 1 of 136 slices shown]
[im 68/136  mediastinal]
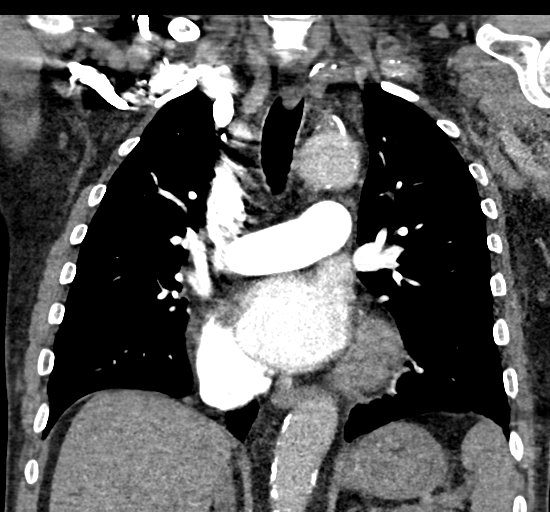

[18 of 36 positions shown; findings below may reference images not displayed]

FINDINGS: Cardiovascular: Pulmonary arteries are well opacified. There is no
acute pulmonary embolus. There is moderate atherosclerosis of the
thoracic aorta. Ascending aorta is 4.1 cm. Distal arch is Recommend
annual imaging followup by CTA or MRA. This recommendation follows
[6B] ACCF/AHA/AATS/ACR/ASA/SCA/TIGER/TIGER/TIGER/TIGER Guidelines for the
Diagnosis and Management of Patients with Thoracic Aortic Disease.
Circulation.[6B]; 121: [id number] 3.4 cm. Proximal arch is 3.5 cm.
The descending aorta is not aneurysmal. The heart is enlarged. There
is atherosclerotic calcification of the coronary vessels. No
pericardial effusion.

Mediastinum/Nodes: Thyroid gland is normal in appearance. No
significant mediastinal, hilar, or axillary adenopathy.

Lungs/Pleura: Lungs are clear. No pleural effusion or pneumothorax.

Upper Abdomen: No acute abnormality.

Musculoskeletal: No chest wall abnormality. No acute or significant
osseous findings.

Review of the MIP images confirms the above findings.
IMPRESSION: 1. Technically adequate exam showing no acute pulmonary embolus.
2. No parenchymal changes typical of fat embolus.
3. Significant coronary artery disease and cardiomegaly.
4.  Aortic atherosclerosis.  ([6B]-[6B])
5. Aneurysmal dilatation of the ascending aorta and arch. Recommend
semi-annual imaging followup by CTA or MRA and referral to
cardiothoracic surgery if not already obtained. This recommendation
follows [6B] ACCF/AHA/AATS/ACR/ASA/SCA/TIGER/TIGER/TIGER/TIGER Guidelines
for the Diagnosis and Management of Patients With Thoracic Aortic
Disease. Circulation. [6B]; 121: [id number]
6.  Aortic aneurysm NOS ([6B]-[6B])

## 2017-04-09 MED ORDER — CYCLOBENZAPRINE HCL 5 MG PO TABS: 5.0000 mg | ORAL_TABLET | Freq: Three times a day (TID) | ORAL | 0 refills | Status: DC | PRN

## 2017-04-09 MED ORDER — NAPROXEN 250 MG PO TABS: 250.0000 mg | ORAL_TABLET | Freq: Two times a day (BID) | ORAL | 0 refills | Status: DC

## 2017-04-09 MED ORDER — IOPAMIDOL (ISOVUE-370) INJECTION 76%
100.0000 mL | Freq: Once | INTRAVENOUS | Status: AC | PRN
  Administered 2017-04-09: 100 mL via INTRAVENOUS

## 2017-04-09 MED ORDER — NAPROXEN 250 MG PO TABS
250.0000 mg | ORAL_TABLET | Freq: Once | ORAL | Status: AC
  Administered 2017-04-09: 250 mg via ORAL
  Filled 2017-04-09: qty 1

## 2017-04-09 MED ORDER — IOPAMIDOL (ISOVUE-370) INJECTION 76%
INTRAVENOUS | Status: AC
  Filled 2017-04-09: qty 100

## 2017-04-09 MED ORDER — CYCLOBENZAPRINE HCL 10 MG PO TABS
5.0000 mg | ORAL_TABLET | Freq: Once | ORAL | Status: AC
  Administered 2017-04-09: 5 mg via ORAL
  Filled 2017-04-09: qty 1

## 2017-04-09 NOTE — ED Notes (Signed)
Taken to xray at this time. 

## 2017-04-09 NOTE — ED Provider Notes (Signed)
[facility] DEPT Provider Note   CSN: [id number] Arrival date & time: 04/08/17  2344   Time seen 04:30 AM  History   Chief Complaint Chief Complaint  Patient presents with  . Chest Pain    HPI Raymond [name] is a 61 y.o. male.  HPI  patient states "I've been hurting all over" since 10 PM. States he was fine all day. However once I start asking specific questions his main complaint is he started having chest pain and he also is having pain in his left arm from a fracture he suffered so he is not indeed having pain all over. He denies any known fever but states he's been feeling hot and cold. He states he did start having chest tightness tonight about 10 PM and felt short of breath. He states it is improving without specific treatment. He denies cough, rhinorrhea, abdominal pain or leg pain. He states he does have pain in his left arm from a fracture. He states he was seen by orthopedics however he has a dialysis access in that same arm and they are debating how to treat his fracture, whether he should have surgery or not. Currently he is in a long-arm splint. He states he's never had this chest discomfort before. He does admit using cocaine but states it was 2 days ago. He states his father had cardiac stents before he died. Nobody else has heart disease in the family.  PCP none [facility]ph[facility]  Past Medical History:  Diagnosis Date  . Anemia   . ESRD (end stage renal disease) on dialysis [facility])    "MWF; [facility]en Rinks" (12/05/2016)  . Hepatitis C    Still positive s/p liver biopsy at [facility]  and interferon therapy for 6 months. Most recent lab work was on 10/24/12  . Hepatitis C    "took the tx; gone now" (12/05/2016)  . History of blood transfusion ~ 2012/2013   "related to my kidneys; blood was low"  . Hypertension   . Substance abuse   . Thyroid disease     Patient Active Problem List   Diagnosis Date Noted  . Acute bronchitis   .  COPD exacerbation ([place])   . End-stage renal disease on hemodialysis ([place])   . Hypoxia 12/05/2016  . Hyperkalemia 12/19/2015  . Hypoglycemia 12/19/2015  . Polysubstance abuse 12/19/2015  . Homelessness 12/19/2015  . Anemia associated with stage 5 chronic renal failure ([place]) 12/19/2015  . Chest pain 12/19/2013  . Pain in limb 12/04/2012  . End stage renal disease ([place]) 12/04/2012  . HEPATITIS C 09/07/2006  . HYPERCHOLESTEROLEMIA 09/07/2006  . HYPERTENSION, BENIGN SYSTEMIC 09/07/2006    Past Surgical History:  Procedure Laterality Date  . AV FISTULA PLACEMENT Left Aug. 2013 ?  [name] LIVER BIOPSY    . SHUNTOGRAM N/A 12/11/2012   Procedure: Earney Mallet;  Surgeon: Serafina Mitchell, MD;  Location: [facility] CATH LAB;  Service: Cardiovascular;  Laterality: N/A;       Home Medications    Prior to Admission medications   Medication Sig Start Date End Date Taking? Authorizing Provider  albuterol (PROVENTIL HFA;VENTOLIN HFA) 108 (90 Base) MCG/ACT inhaler Inhale 2 puffs into the lungs every 6 (six) hours as needed for wheezing or shortness of breath. 12/06/16  Yes Lorella Nimrod, MD  amLODipine (NORVASC) 5 MG tablet Take 5 mg by mouth daily.   Yes [provider]  calcitRIOL (ROCALTROL) 0.25 MCG capsule Take 7 capsules (1.75 mcg total) by mouth every  Monday, Wednesday, and Friday with hemodialysis. 12/07/16  Yes Lorella Nimrod, MD  cinacalcet (SENSIPAR) 30 MG tablet Take 1 tablet (30 mg total) by mouth daily with supper. 12/21/15  Yes Arrien, Jimmy Picket, MD  folic acid (FOLVITE) 1 MG tablet Take 1 tablet (1 mg total) by mouth daily. Patient taking differently: Take 1 mg by mouth 3 (three) times a week.  12/07/16  Yes Lorella Nimrod, MD  gabapentin (NEURONTIN) 300 MG capsule Take 300 mg by mouth at bedtime as needed (sleep).   Yes [provider]  guaiFENesin (MUCINEX) 600 MG 12 hr tablet Take 1 tablet (600 mg total) by mouth 2 (two) times daily. Patient taking differently: Take 600 mg by  mouth 2 (two) times daily as needed for cough.  12/06/16  Yes Lorella Nimrod, MD  HYDROcodone-acetaminophen (NORCO/VICODIN) 5-325 MG tablet Take 1 tablet by mouth every 6 (six) hours as needed for moderate pain. 02/16/16  Yes Lawyer, Harrell Gave, PA-C  metoprolol succinate (TOPROL-XL) 25 MG 24 hr tablet Take 2 tablets (50 mg total) by mouth daily. 12/21/15  Yes Arrien, Jimmy Picket, MD  Multiple Vitamin (MULTIVITAMIN WITH MINERALS) TABS tablet Take 1 tablet by mouth daily. Patient taking differently: Take 1 tablet by mouth 3 (three) times a week.  12/07/16  Yes Lorella Nimrod, MD  oxyCODONE (OXY IR/ROXICODONE) 5 MG immediate release tablet Take 1 tablet (5 mg total) by mouth every 6 (six) hours as needed for severe pain. 03/29/17  Yes Ward, Ozella Almond, PA-C  sevelamer (RENAGEL) 800 MG tablet Take 800 mg by mouth 3 (three) times daily with meals.   Yes [provider]  thiamine (VITAMIN B-1) 100 MG tablet Take 100 mg by mouth 3 (three) times a week.    Yes [provider]  cyclobenzaprine (FLEXERIL) 5 MG tablet Take 1 tablet (5 mg total) by mouth 3 (three) times daily as needed (muscle soreness). 04/09/17   Rolland Porter, MD  naproxen (NAPROSYN) 250 MG tablet Take 1 tablet (250 mg total) by mouth 2 (two) times daily. 04/09/17   Rolland Porter, MD    Family History Family History  Problem Relation Age of Onset  . Diabetes Father   . Hypertension Father     Social History Social History  Substance Use Topics  . Smoking status: Former Smoker    Packs/day: 1.00    Years: 25.00    Types: Cigarettes    Quit date: 08/01/2011  . Smokeless tobacco: Never Used  . Alcohol use 4.8 oz/week    8 Glasses of wine per week     Comment: 12/05/2016 "~ 1 pint/day; twice/week; wine; no liquor"  states last cocaine was 2 days ago Smokes 1/2 ppd   Allergies   Patient has no known allergies.   Review of Systems Review of Systems  All other systems reviewed and are negative.    Physical  Exam Updated Vital Signs BP (!) 148/98   Pulse 72   Temp 98.2 F (36.8 C) (Oral)   Resp 13   Ht 5\' 8"  (1.727 m)   Wt 72.6 kg (160 lb)   SpO2 97%   BMI 24.33 kg/m   Vital signs normal    Physical Exam  Constitutional: He is oriented to person, place, and time. He appears well-developed and well-nourished.  Non-toxic appearance. He does not appear ill. No distress.  HENT:  Head: Normocephalic and atraumatic.  Right Ear: External ear normal.  Left Ear: External ear normal.  Nose: Nose normal. No mucosal edema or  rhinorrhea.  Mouth/Throat: Oropharynx is clear and moist and mucous membranes are normal. No dental abscesses or uvula swelling.  Eyes: Pupils are equal, round, and reactive to light. Conjunctivae and EOM are normal.  Neck: Normal range of motion and full passive range of motion without pain. Neck supple.  Cardiovascular: Normal rate, regular rhythm and normal heart sounds.  Exam reveals no gallop and no friction rub.   No murmur heard. Pulmonary/Chest: Effort normal and breath sounds normal. No respiratory distress. He has no wheezes. He has no rhonchi. He has no rales. He exhibits no tenderness and no crepitus.  Abdominal: Soft. Normal appearance and bowel sounds are normal. He exhibits no distension. There is no tenderness. There is no rebound and no guarding.  Musculoskeletal: Normal range of motion. He exhibits tenderness. He exhibits no edema.  Moves all extremities well except his LUE. He has a long arm splint on his LUE.   Neurological: He is alert and oriented to person, place, and time. He has normal strength. No cranial nerve deficit.  Skin: Skin is warm, dry and intact. No rash noted. No erythema. No pallor.  Psychiatric: He has a normal mood and affect. His speech is normal and behavior is normal. His mood appears not anxious.  Nursing note and vitals reviewed.    ED Treatments / Results  Labs (all labs ordered are listed, but only abnormal results are  displayed) Results for orders placed or performed during the hospital encounter of 16/10/96  Basic metabolic panel  Result Value Ref Range   Sodium 131 (L) 135 - 145 mmol/L   Potassium 4.2 3.5 - 5.1 mmol/L   Chloride 92 (L) 101 - 111 mmol/L   CO2 25 22 - 32 mmol/L   Glucose, Bld 83 65 - 99 mg/dL   BUN 33 (H) 6 - 20 mg/dL   Creatinine, Ser 11.24 (H) 0.61 - 1.24 mg/dL   Calcium 8.2 (L) 8.9 - 10.3 mg/dL   GFR calc non Af Amer 4 (L) >60 mL/min   GFR calc Af Amer 5 (L) >60 mL/min   Anion gap 14 5 - 15  CBC  Result Value Ref Range   WBC 3.1 (L) 4.0 - 10.5 K/uL   RBC 3.23 (L) 4.22 - 5.81 MIL/uL   Hemoglobin 10.9 (L) 13.0 - 17.0 g/dL   HCT 33.8 (L) 39.0 - 52.0 %   MCV 104.6 (H) 78.0 - 100.0 fL   MCH 33.7 26.0 - 34.0 pg   MCHC 32.2 30.0 - 36.0 g/dL   RDW 16.0 (H) 11.5 - 15.5 %   Platelets 134 (L) 150 - 400 K/uL  I-stat troponin, ED  Result Value Ref Range   Troponin i, poc 0.02 0.00 - 0.08 ng/mL   Comment 3          I-stat troponin, ED  Result Value Ref Range   Troponin i, poc 0.02 0.00 - 0.08 ng/mL   Comment 3           Laboratory interpretation all normal except Chronic renal failure low total white blood cell count, anemia, high MCV c/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 History of smoking and hypertension. Dialysis patient. EXAM: CHEST  2 VIEW COMPARISON:  12/05/2016 CXR and chest CT FINDINGS: Borderline  cardiomegaly with tortuous thoracic aorta. No aneurysm. No pneumonic consolidation, effusion or pulmonary edema. Nipple shadows accounting for bilateral lower lobe nodular densities. No acute nor suspicious osseous abnormality. IMPRESSION: Stable appearance of the chest without acute cardiopulmonary disease. Borderline cardiomegaly. Electronically Signed   By: Ashley Royalty M.D.   On: 04/09/2017 02:14    Ct Angio Chest Pe W/cm &/or Wo Cm  Result Date: 04/09/2017 CLINICAL DATA:  Ct chest PE 147ml ISO 370, Dialysis patient, recent fracture of ulna, new chest pressure and SOB, eval for PE or fat embolus EXAM: CT ANGIOGRAPHY CHEST WITH CONTRAST TECHNIQUE: Multidetector CT imaging of the chest was performed using the standard protocol during bolus administration of intravenous contrast. Multiplanar CT image reconstructions and MIPs were obtained to evaluate the vascular anatomy. CONTRAST:  100 cc Omnipaque 370 COMPARISON:  12/05/2016 and 04/09/2017 FINDINGS: Cardiovascular: Pulmonary arteries are well opacified. There is no acute pulmonary embolus. There is moderate atherosclerosis of the thoracic aorta. Ascending aorta is 4.1 cm. Distal arch is Recommend annual imaging followup by CTA or MRA. This recommendation follows 2010 ACCF/AHA/AATS/ACR/ASA/SCA/SCAI/SIR/STS/SVM Guidelines for the Diagnosis and Management of Patients with Thoracic Aortic Disease. Circulation.2010; 121: [id number] 3.4 cm. Proximal arch is 3.5 cm. The descending aorta is not aneurysmal. The heart is enlarged. There is atherosclerotic calcification of the coronary vessels. No pericardial effusion. Mediastinum/Nodes: Thyroid gland is normal in appearance. No significant mediastinal, hilar, or axillary adenopathy. Lungs/Pleura: Lungs are clear. No pleural effusion or pneumothorax. Upper Abdomen: No acute abnormality. Musculoskeletal: No chest wall abnormality. No acute or significant osseous findings. Review of the MIP images confirms the above  findings. IMPRESSION: 1. Technically adequate exam showing no acute pulmonary embolus. 2. No parenchymal changes typical of fat embolus. 3. Significant coronary artery disease and cardiomegaly. 4.  Aortic atherosclerosis.  (ICD10-I70.0) 5. 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 Recommend semi-annual imaging followup by CTA or MRA and referral to cardiothoracic surgery if not already obtained. This recommendation follows 2010 ACCF/AHA/AATS/ACR/ASA/SCA/SCAI/SIR/STS/SVM Guidelines for the Diagnosis and Management of Patients With Thoracic Aortic Disease. Circulation. 2010; 121: [id number] 6.  Aortic aneurysm NOS (ICD10-I71.9) Electronically Signed   By: Nolon Nations M.D.   On: 04/09/2017 08:13    Procedures Procedures (including critical care time)  Medications Ordered in ED Medications  oxyCODONE-acetaminophen (PERCOCET/ROXICET) 5-325 MG per tablet 1 tablet (1 tablet Oral Given 04/08/17 2356)  oxyCODONE-acetaminophen (PERCOCET/ROXICET) 5-325 MG per tablet (not administered)  naproxen (NAPROSYN) tablet 250 mg (250 mg Oral Given 04/09/17 0452)  cyclobenzaprine (FLEXERIL) tablet 5 mg (5 mg Oral Given 04/09/17 0454)  iopamidol (ISOVUE-370) 76 % injection 100 mL (100 mLs Intravenous Contrast Given 04/09/17 0714)     Initial Impression / Assessment and Plan / ED Course  I have reviewed the triage vital signs and the nursing notes.  Pertinent labs & imaging results that were available during my care of the patient were reviewed by me and considered in my medical decision making (see chart for details).    Delta troponin was done to look for cardiac event. A CT angiogram was done to look for PE or other  source of his chest pain.  8:20 AM patient was given his CT results. He also is informed about the enlargement of his aorta and need to follow-up with vascular doctors. He was sent home on anti-inflammatory muscle relaxer for his atypical chest pain. He also is encouraged to follow up  with orthopedics about his forearm fracture.   Final Clinical Impressions(s) / ED Diagnoses   Final diagnoses:  Atypical chest pain    New Prescriptions New Prescriptions   CYCLOBENZAPRINE (FLEXERIL) 5 MG TABLET    Take 1 tablet (5 mg total) by mouth 3 (three) times daily as needed (muscle soreness).   NAPROXEN (NAPROSYN) 250 MG TABLET    Take 1 tablet (250 mg total) by mouth 2 (two) times daily.    Plan discharge  Rolland Porter, MD, Barbette Or, MD 04/09/17 [phone]

## 2017-04-09 NOTE — ED Notes (Signed)
Called for xray.  No answer.

## 2017-04-09 NOTE — ED Notes (Signed)
Pt in CT.

## 2017-04-09 NOTE — Discharge Instructions (Signed)
Follow up with Dr Scot Dock about the enlargement of your aorta in your chest. You should have another CT scan of your chest in 6 months.  Take the medications as prescribed. Keep your appointments with the orthopedist about your fracture.

## 2017-04-10 DIAGNOSIS — D509 Iron deficiency anemia, unspecified: Secondary | ICD-10-CM | POA: Diagnosis not present

## 2017-04-10 DIAGNOSIS — N2581 Secondary hyperparathyroidism of renal origin: Secondary | ICD-10-CM | POA: Diagnosis not present

## 2017-04-10 DIAGNOSIS — N186 End stage renal disease: Secondary | ICD-10-CM | POA: Diagnosis not present

## 2017-04-10 DIAGNOSIS — D631 Anemia in chronic kidney disease: Secondary | ICD-10-CM | POA: Diagnosis not present

## 2017-04-14 DIAGNOSIS — N2581 Secondary hyperparathyroidism of renal origin: Secondary | ICD-10-CM | POA: Diagnosis not present

## 2017-04-14 DIAGNOSIS — D509 Iron deficiency anemia, unspecified: Secondary | ICD-10-CM | POA: Diagnosis not present

## 2017-04-14 DIAGNOSIS — N186 End stage renal disease: Secondary | ICD-10-CM | POA: Diagnosis not present

## 2017-04-14 DIAGNOSIS — D631 Anemia in chronic kidney disease: Secondary | ICD-10-CM | POA: Diagnosis not present

## 2017-04-17 DIAGNOSIS — N186 End stage renal disease: Secondary | ICD-10-CM | POA: Diagnosis not present

## 2017-04-17 DIAGNOSIS — D631 Anemia in chronic kidney disease: Secondary | ICD-10-CM | POA: Diagnosis not present

## 2017-04-17 DIAGNOSIS — D509 Iron deficiency anemia, unspecified: Secondary | ICD-10-CM | POA: Diagnosis not present

## 2017-04-17 DIAGNOSIS — N2581 Secondary hyperparathyroidism of renal origin: Secondary | ICD-10-CM | POA: Diagnosis not present

## 2017-04-21 DIAGNOSIS — D509 Iron deficiency anemia, unspecified: Secondary | ICD-10-CM | POA: Diagnosis not present

## 2017-04-21 DIAGNOSIS — D631 Anemia in chronic kidney disease: Secondary | ICD-10-CM | POA: Diagnosis not present

## 2017-04-21 DIAGNOSIS — N186 End stage renal disease: Secondary | ICD-10-CM | POA: Diagnosis not present

## 2017-04-21 DIAGNOSIS — N2581 Secondary hyperparathyroidism of renal origin: Secondary | ICD-10-CM | POA: Diagnosis not present

## 2017-04-24 DIAGNOSIS — N2581 Secondary hyperparathyroidism of renal origin: Secondary | ICD-10-CM | POA: Diagnosis not present

## 2017-04-24 DIAGNOSIS — D631 Anemia in chronic kidney disease: Secondary | ICD-10-CM | POA: Diagnosis not present

## 2017-04-24 DIAGNOSIS — N186 End stage renal disease: Secondary | ICD-10-CM | POA: Diagnosis not present

## 2017-04-24 DIAGNOSIS — D509 Iron deficiency anemia, unspecified: Secondary | ICD-10-CM | POA: Diagnosis not present

## 2017-04-26 DIAGNOSIS — D509 Iron deficiency anemia, unspecified: Secondary | ICD-10-CM | POA: Diagnosis not present

## 2017-04-26 DIAGNOSIS — N186 End stage renal disease: Secondary | ICD-10-CM | POA: Diagnosis not present

## 2017-04-26 DIAGNOSIS — D631 Anemia in chronic kidney disease: Secondary | ICD-10-CM | POA: Diagnosis not present

## 2017-04-26 DIAGNOSIS — N2581 Secondary hyperparathyroidism of renal origin: Secondary | ICD-10-CM | POA: Diagnosis not present

## 2017-04-28 ENCOUNTER — Encounter (HOSPITAL_COMMUNITY): Payer: Self-pay | Admitting: Emergency Medicine

## 2017-04-28 ENCOUNTER — Emergency Department (HOSPITAL_COMMUNITY)
Admission: EM | Admit: 2017-04-28 | Discharge: 2017-04-29 | Disposition: A | Discharge status: Home / Self Care | Payer: Medicare Other | Attending: Emergency Medicine | Admitting: Emergency Medicine

## 2017-04-28 ENCOUNTER — Emergency Department (HOSPITAL_COMMUNITY): Payer: Medicare Other

## 2017-04-28 DIAGNOSIS — R1031 Right lower quadrant pain: Secondary | ICD-10-CM | POA: Diagnosis present

## 2017-04-28 DIAGNOSIS — Z79899 Other long term (current) drug therapy: Secondary | ICD-10-CM | POA: Diagnosis not present

## 2017-04-28 DIAGNOSIS — F17210 Nicotine dependence, cigarettes, uncomplicated: Secondary | ICD-10-CM | POA: Diagnosis not present

## 2017-04-28 DIAGNOSIS — I120 Hypertensive chronic kidney disease with stage 5 chronic kidney disease or end stage renal disease: Secondary | ICD-10-CM | POA: Diagnosis not present

## 2017-04-28 DIAGNOSIS — D631 Anemia in chronic kidney disease: Secondary | ICD-10-CM | POA: Diagnosis not present

## 2017-04-28 DIAGNOSIS — R109 Unspecified abdominal pain: Secondary | ICD-10-CM | POA: Diagnosis not present

## 2017-04-28 DIAGNOSIS — D509 Iron deficiency anemia, unspecified: Secondary | ICD-10-CM | POA: Diagnosis not present

## 2017-04-28 DIAGNOSIS — R1084 Generalized abdominal pain: Secondary | ICD-10-CM | POA: Diagnosis not present

## 2017-04-28 DIAGNOSIS — E7800 Pure hypercholesterolemia, unspecified: Secondary | ICD-10-CM | POA: Insufficient documentation

## 2017-04-28 DIAGNOSIS — J449 Chronic obstructive pulmonary disease, unspecified: Secondary | ICD-10-CM | POA: Diagnosis not present

## 2017-04-28 DIAGNOSIS — N2581 Secondary hyperparathyroidism of renal origin: Secondary | ICD-10-CM | POA: Diagnosis not present

## 2017-04-28 DIAGNOSIS — S52202D Unspecified fracture of shaft of left ulna, subsequent encounter for closed fracture with routine healing: Secondary | ICD-10-CM | POA: Diagnosis not present

## 2017-04-28 DIAGNOSIS — N186 End stage renal disease: Secondary | ICD-10-CM | POA: Diagnosis not present

## 2017-04-28 DIAGNOSIS — Z992 Dependence on renal dialysis: Secondary | ICD-10-CM | POA: Diagnosis not present

## 2017-04-28 DIAGNOSIS — Z87891 Personal history of nicotine dependence: Secondary | ICD-10-CM | POA: Diagnosis not present

## 2017-04-28 DIAGNOSIS — S52201D Unspecified fracture of shaft of right ulna, subsequent encounter for closed fracture with routine healing: Secondary | ICD-10-CM | POA: Diagnosis not present

## 2017-04-28 DIAGNOSIS — K2900 Acute gastritis without bleeding: Secondary | ICD-10-CM | POA: Diagnosis not present

## 2017-04-28 LAB — CBC
HCT: 35.1 % — ABNORMAL LOW (ref 39.0–52.0)
Hemoglobin: 10.8 g/dL — ABNORMAL LOW (ref 13.0–17.0)
MCH: 32.9 pg (ref 26.0–34.0)
MCHC: 30.8 g/dL (ref 30.0–36.0)
MCV: 107 fL — AB (ref 78.0–100.0)
PLATELETS: 115 10*3/uL — AB (ref 150–400)
RBC: 3.28 MIL/uL — AB (ref 4.22–5.81)
RDW: 15.8 % — ABNORMAL HIGH (ref 11.5–15.5)
WBC: 3.9 10*3/uL — AB (ref 4.0–10.5)

## 2017-04-28 LAB — COMPREHENSIVE METABOLIC PANEL
ALT: 11 U/L — AB (ref 17–63)
ANION GAP: 13 (ref 5–15)
AST: 23 U/L (ref 15–41)
Albumin: 3.5 g/dL (ref 3.5–5.0)
Alkaline Phosphatase: 51 U/L (ref 38–126)
BUN: 11 mg/dL (ref 6–20)
CHLORIDE: 96 mmol/L — AB (ref 101–111)
CO2: 28 mmol/L (ref 22–32)
CREATININE: 5.57 mg/dL — AB (ref 0.61–1.24)
Calcium: 8.2 mg/dL — ABNORMAL LOW (ref 8.9–10.3)
GFR, EST AFRICAN AMERICAN: 11 mL/min — AB (ref >60)
GFR, EST NON AFRICAN AMERICAN: 10 mL/min — AB (ref >60)
Glucose, Bld: 66 mg/dL (ref 65–99)
Potassium: 4 mmol/L (ref 3.5–5.1)
Sodium: 137 mmol/L (ref 135–145)
Total Bilirubin: 0.4 mg/dL (ref 0.3–1.2)
Total Protein: 8.1 g/dL (ref 6.5–8.1)

## 2017-04-28 LAB — LIPASE, BLOOD: LIPASE: 59 U/L — AB (ref 11–51)

## 2017-04-28 IMAGING — CT CT ABD-PELV W/ CM
2 of 5 series · 16 of 46 positions shown, 18 images · IV contrast (APPLIED)
Comparison: [DATE] CT abdomen and pelvis.

CLINICAL DATA: 61 y/o  M; 3 days of generalized abdominal pain.

EXAM:
CT ABDOMEN AND PELVIS WITH CONTRAST
TECHNIQUE: Multidetector CT imaging of the abdomen and pelvis was performed
using the standard protocol following bolus administration of
intravenous contrast.
CONTRAST:  95 cc [WN]

[Series 3: abdomen 5.0 · axial · 0.84mm/px · z∈[-791,-396]mm · 13 of 91 slices shown, 15 images]
[im 6/91  soft-tissue]
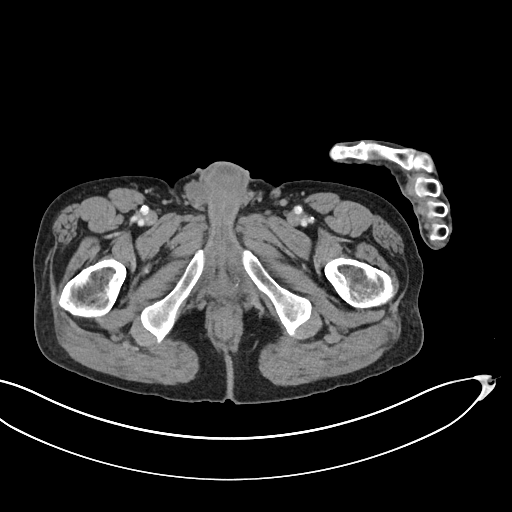
[im 6/91  bone]
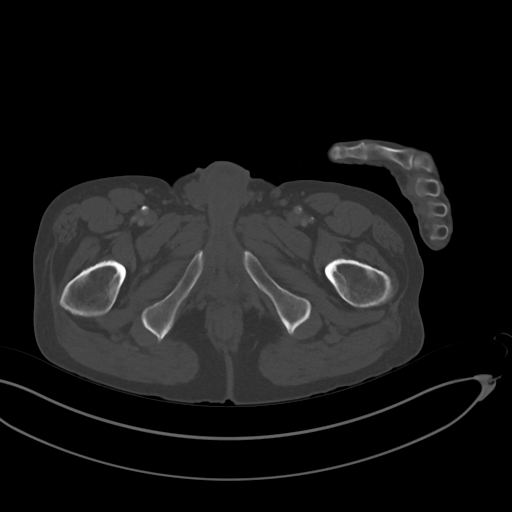
[im 11/91  soft-tissue]
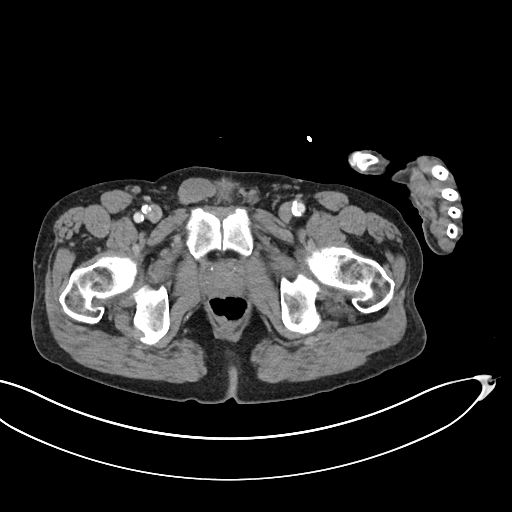
[im 22/91  soft-tissue]
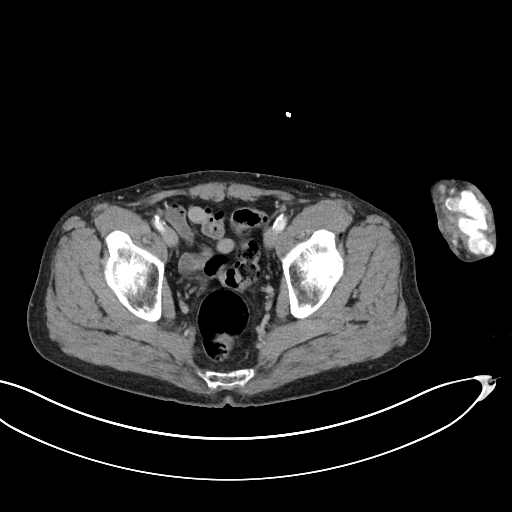
[im 27/91  soft-tissue]
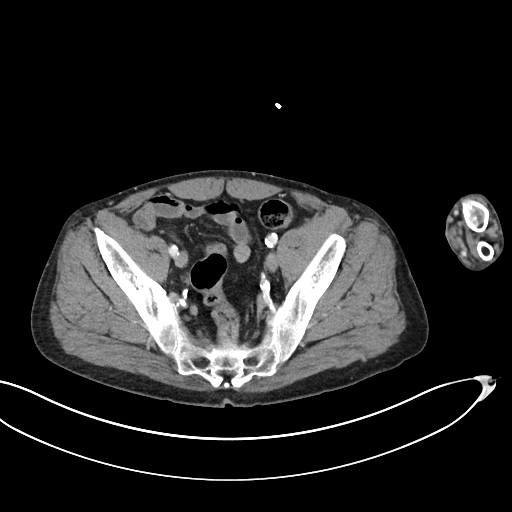
[im 32/91  soft-tissue]
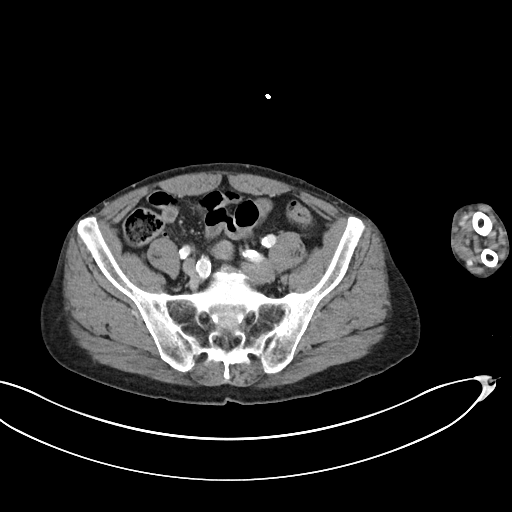
[im 38/91  soft-tissue]
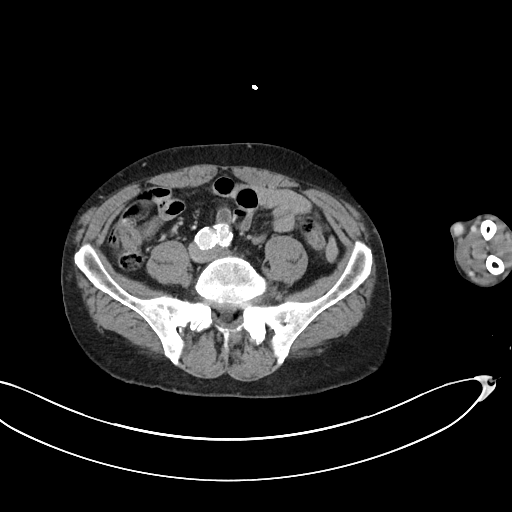
[im 48/91  soft-tissue]
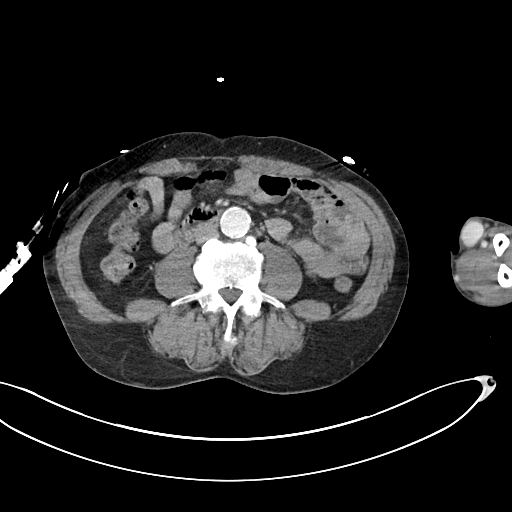
[im 53/91  soft-tissue]
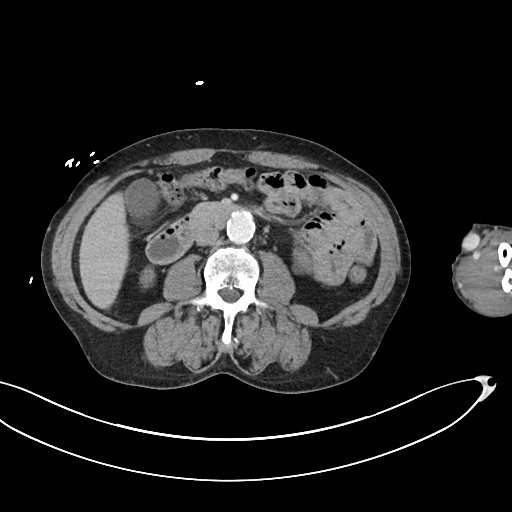
[im 59/91  soft-tissue]
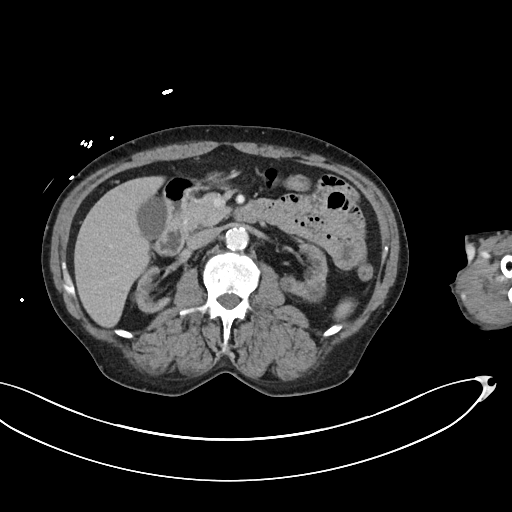
[im 59/91  bone]
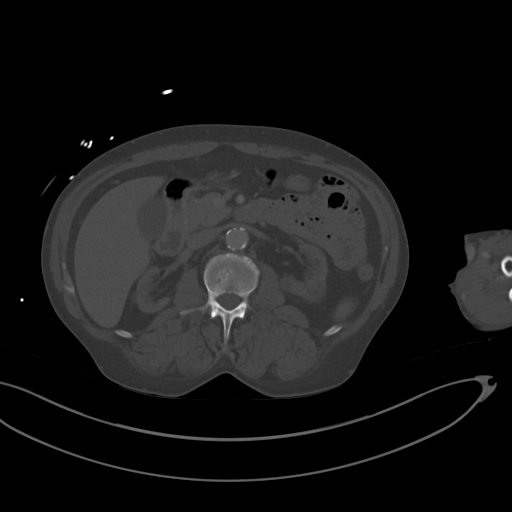
[im 64/91  soft-tissue]
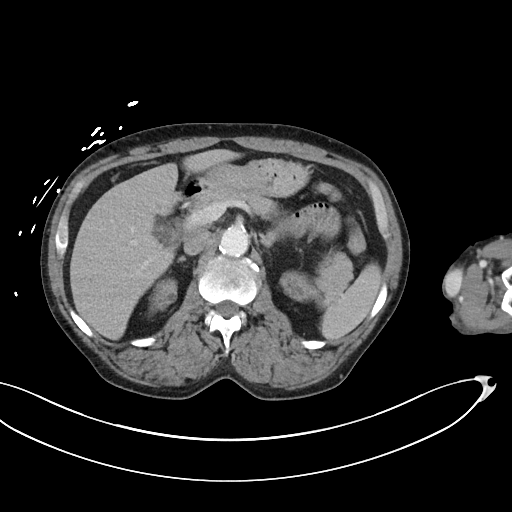
[im 69/91  soft-tissue]
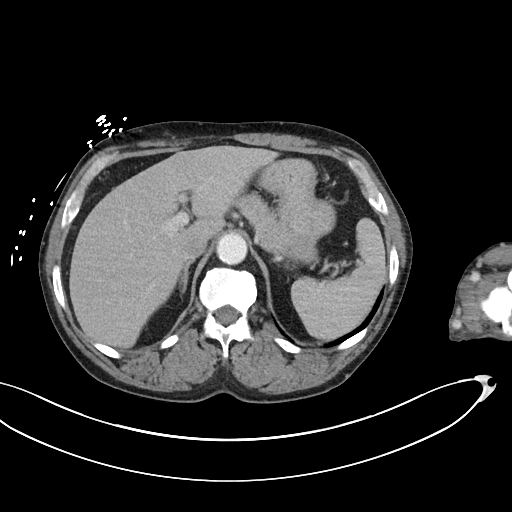
[im 80/91  soft-tissue]
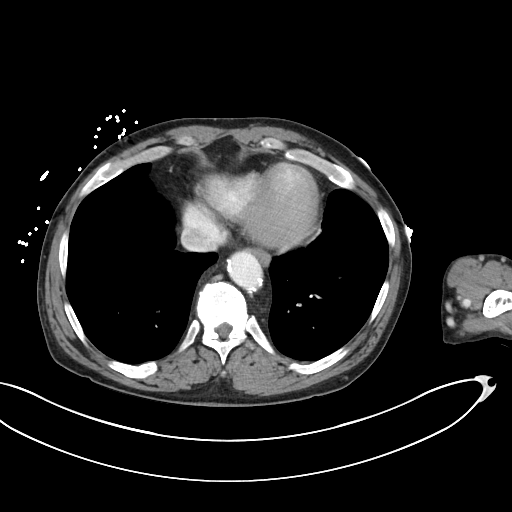
[im 85/91  soft-tissue]
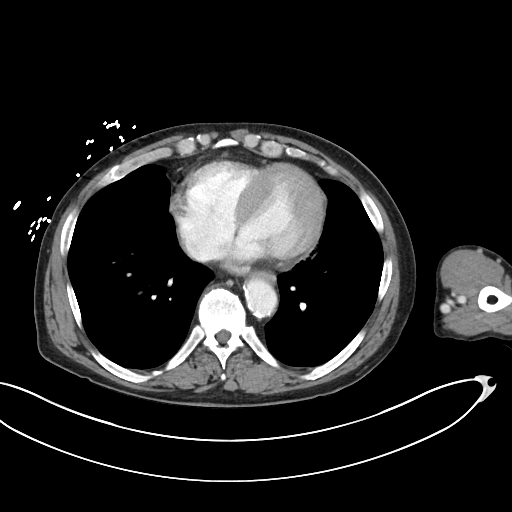

[Series 6: abdomen 3.0 mpr cor · coronal · 0.83mm/px · 3 of 98 slices shown]
[im 44/98  soft-tissue]
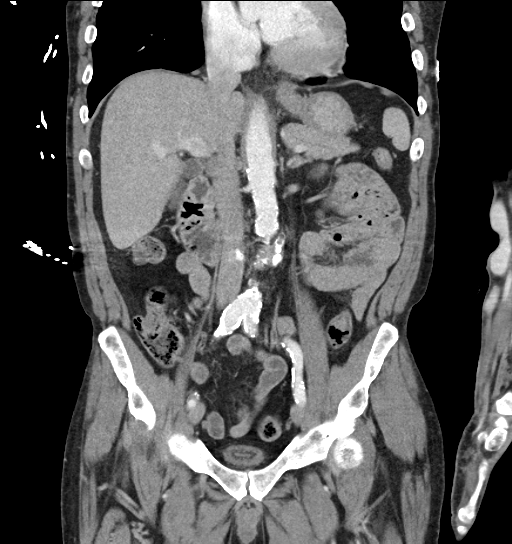
[im 54/98  soft-tissue]
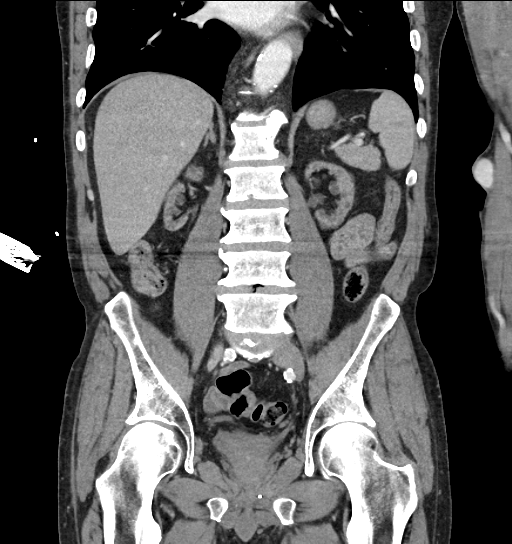
[im 65/98  soft-tissue]
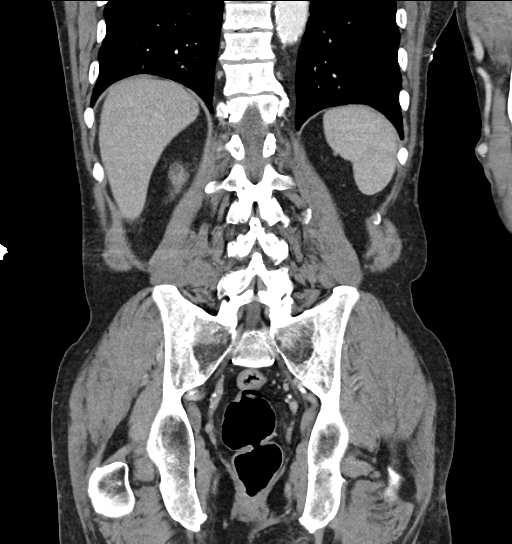

[16 of 46 positions shown; findings below may reference images not displayed]

FINDINGS: Lower chest: No acute abnormality.

Hepatobiliary: No focal liver abnormality is seen. No gallstones,
gallbladder wall thickening, or biliary dilatation.

Pancreas: Unremarkable. No pancreatic ductal dilatation or
surrounding inflammatory changes.

Spleen: Normal in size without focal abnormality.

Adrenals/Urinary Tract: Normal adrenal glands. Atrophic kidneys.
Multiple fluid attenuating well circumcised lesions in the kidneys
measuring up to 22 mm in left interpolar kidney compatible with
cysts. No hydronephrosis. Collapsed bladder.

Stomach/Bowel: Stomach is within normal limits. Appendix appears
normal. No evidence of bowel wall thickening, distention, or
inflammatory changes.

Vascular/Lymphatic: Aortic atherosclerosis. 26 mm infrarenal
abdominal aortic ectasia. No enlarged abdominal or pelvic lymph
nodes.

Reproductive: Prostate is unremarkable.

Other: No abdominal wall hernia or abnormality. No abdominopelvic
ascites.

Musculoskeletal: Chronic bilateral posterior rib fractures. No acute
fracture identified. Lumbar spine degenerative changes with
multilevel disc space narrowing, disc bulges, and lower lumbar facet
arthrosis. Canal stenosis greatest at L4-5 level where it is
probably moderate.
IMPRESSION: 1. No acute process identified.
2. Aortic atherosclerosis. 26 mm infrarenal abdominal aortic
ectasia. Ectatic abdominal aorta at risk for aneurysm development.
Recommend followup by ultrasound in 5 years. This recommendation
follows ACR consensus guidelines: White Paper of the ACR Incidental
Findings Committee II on Vascular Findings. [HOSPITAL] [WN];
[DATE].
3. Atrophic kidneys with multiple bilateral cysts. No
hydronephrosis. Collapsed bladder.

By: MARGARETTE M.D.

## 2017-04-28 MED ORDER — GI COCKTAIL ~~LOC~~
30.0000 mL | Freq: Once | ORAL | Status: AC
  Administered 2017-04-29: 30 mL via ORAL
  Filled 2017-04-28: qty 30

## 2017-04-28 MED ORDER — ONDANSETRON 4 MG PO TBDP: 4.0000 mg | ORAL_TABLET | Freq: Once | ORAL | Status: DC | PRN

## 2017-04-28 MED ORDER — IOPAMIDOL (ISOVUE-300) INJECTION 61%
INTRAVENOUS | Status: AC
  Filled 2017-04-28: qty 100

## 2017-04-28 NOTE — ED Notes (Signed)
Unable to get urine sample. Pt stated he's on dialysis and can't urinate.

## 2017-04-28 NOTE — ED Notes (Signed)
Pt. Returned from CT via stretcher. 

## 2017-04-28 NOTE — ED Triage Notes (Signed)
Pt arrives from home via [facility] c/o generalized abd pain x 3 days, reports n/v today.  EMS reports pt completed dialysis today. Pt reports dysuria, denies fevers/chills.

## 2017-04-28 NOTE — ED Provider Notes (Signed)
[place] EMERGENCY DEPARTMENT Provider Note   CSN: [id number] Arrival date & time: 04/28/17  1332     History   Chief Complaint Chief Complaint  Patient presents with  . Abdominal Pain    HPI Raymond [name] is a 61 y.o. male with a past medical history of end-stage renal disease on dialysis MWF who presents to ED for evaluation of 1 week history of sharp, right lower abdominal pain. States that the pain shifts indifferent parts of his abdomen. States that the pain was a lot worse today after his dialysis session and is associated with 3 episodes of vomiting. He states that he is unable to eat anything and states that "it's just fluid that comes up." he denies any previous history of similar symptoms. States his last bowel movement was yesterday and was normal. He denies any fevers, chills, urinary symptoms, sick contacts with similar symptoms, trouble breathing, chest pain, prior abdominal surgeries. He reports daily tobacco use, occasional alcohol use. He denies any other drug use.  HPI  Past Medical History:  Diagnosis Date  . Anemia   . ESRD (end stage renal disease) on dialysis [facility])    "MWF; [facility] Rinks" (12/05/2016)  . Hepatitis C    Still positive s/p liver biopsy at [facility]  and interferon therapy for 6 months. Most recent lab work was on 10/24/12  . Hepatitis C    "took the tx; gone now" (12/05/2016)  . History of blood transfusion ~ 2012/2013   "related to my kidneys; blood was low"  . Hypertension   . Substance abuse ([place])   . Thyroid disease     Patient Active Problem List   Diagnosis Date Noted  . Acute bronchitis   . COPD exacerbation ([place])   . End-stage renal disease on hemodialysis ([place])   . Hypoxia 12/05/2016  . Hyperkalemia 12/19/2015  . Hypoglycemia 12/19/2015  . Polysubstance abuse ([place]) 12/19/2015  . Homelessness 12/19/2015  . Anemia associated with stage 5 chronic renal failure ([place]) 12/19/2015  . Chest pain 12/19/2013  . Pain  in limb 12/04/2012  . End stage renal disease ([place]) 12/04/2012  . HEPATITIS C 09/07/2006  . HYPERCHOLESTEROLEMIA 09/07/2006  . HYPERTENSION, BENIGN SYSTEMIC 09/07/2006    Past Surgical History:  Procedure Laterality Date  . AV FISTULA PLACEMENT Left Aug. 2013 ?  [name] LIVER BIOPSY    . SHUNTOGRAM N/A 12/11/2012   Procedure: Earney Mallet;  Surgeon: Serafina Mitchell, MD;  Location: [facility] CATH LAB;  Service: Cardiovascular;  Laterality: N/A;       Home Medications    Prior to Admission medications   Medication Sig Start Date End Date Taking? Authorizing Provider  albuterol (PROVENTIL HFA;VENTOLIN HFA) 108 (90 Base) MCG/ACT inhaler Inhale 2 puffs into the lungs every 6 (six) hours as needed for wheezing or shortness of breath. 12/06/16  Yes Lorella Nimrod, MD  amLODipine (NORVASC) 5 MG tablet Take 5 mg by mouth daily.   Yes [provider]  calcitRIOL (ROCALTROL) 0.25 MCG capsule Take 7 capsules (1.75 mcg total) by mouth every Monday, Wednesday, and Friday with hemodialysis. 12/07/16  Yes Lorella Nimrod, MD  cinacalcet (SENSIPAR) 30 MG tablet Take 1 tablet (30 mg total) by mouth daily with supper. 12/21/15  Yes Arrien, Jimmy Picket, MD  cyclobenzaprine (FLEXERIL) 5 MG tablet Take 1 tablet (5 mg total) by mouth 3 (three) times daily as needed (muscle soreness). 04/09/17  Yes Rolland Porter, MD  folic acid (FOLVITE) 1 MG tablet Take 1 tablet (  1 mg total) by mouth daily. Patient taking differently: Take 1 mg by mouth 3 (three) times a week.  12/07/16  Yes Lorella Nimrod, MD  gabapentin (NEURONTIN) 300 MG capsule Take 300 mg by mouth at bedtime as needed (sleep).   Yes [provider]  guaiFENesin (MUCINEX) 600 MG 12 hr tablet Take 1 tablet (600 mg total) by mouth 2 (two) times daily. Patient taking differently: Take 600 mg by mouth 2 (two) times daily as needed for cough.  12/06/16  Yes Lorella Nimrod, MD  HYDROcodone-acetaminophen (NORCO/VICODIN) 5-325 MG tablet Take 1 tablet by mouth every 6  (six) hours as needed for moderate pain. 02/16/16  Yes Lawyer, Harrell Gave, PA-C  metoprolol succinate (TOPROL-XL) 25 MG 24 hr tablet Take 2 tablets (50 mg total) by mouth daily. 12/21/15  Yes Arrien, Jimmy Picket, MD  Multiple Vitamin (MULTIVITAMIN WITH MINERALS) TABS tablet Take 1 tablet by mouth daily. Patient taking differently: Take 1 tablet by mouth 3 (three) times a week.  12/07/16  Yes Lorella Nimrod, MD  naproxen (NAPROSYN) 250 MG tablet Take 1 tablet (250 mg total) by mouth 2 (two) times daily. 04/09/17  Yes Rolland Porter, MD  oxyCODONE (OXY IR/ROXICODONE) 5 MG immediate release tablet Take 1 tablet (5 mg total) by mouth every 6 (six) hours as needed for severe pain. 03/29/17  Yes Ward, Ozella Almond, PA-C  sevelamer (RENAGEL) 800 MG tablet Take 800 mg by mouth 3 (three) times daily with meals.   Yes [provider]  thiamine (VITAMIN B-1) 100 MG tablet Take 100 mg by mouth 3 (three) times a week.    Yes [provider]  omeprazole (PRILOSEC) 20 MG capsule Take 1 capsule (20 mg total) by mouth daily. 04/29/17   Delia Heady, PA-C    Family History Family History  Problem Relation Age of Onset  . Diabetes Father   . Hypertension Father     Social History Social History  Substance Use Topics  . Smoking status: Former Smoker    Packs/day: 1.00    Years: 25.00    Types: Cigarettes    Quit date: 08/01/2011  . Smokeless tobacco: Never Used  . Alcohol use 4.8 oz/week    8 Glasses of wine per week     Comment: 12/05/2016 "~ 1 pint/day; twice/week; wine; no liquor"     Allergies   Patient has no known allergies.   Review of Systems Review of Systems  Constitutional: Positive for appetite change. Negative for chills and fever.  HENT: Negative for ear pain, rhinorrhea, sneezing and sore throat.   Eyes: Negative for photophobia and visual disturbance.  Respiratory: Negative for cough, chest tightness, shortness of breath and wheezing.   Cardiovascular: Negative for  chest pain and palpitations.  Gastrointestinal: Positive for abdominal pain, nausea and vomiting. Negative for blood in stool, constipation and diarrhea.  Genitourinary: Negative for dysuria, hematuria and urgency.  Musculoskeletal: Negative for myalgias.  Skin: Negative for rash.  Neurological: Negative for dizziness, weakness and light-headedness.     Physical Exam Updated Vital Signs BP 103/74   Pulse 96   Temp 98.1 F (36.7 C) (Oral)   Resp 16   SpO2 94%   Physical Exam  Constitutional: He appears well-developed and well-nourished. No distress.  Nontoxic appearing and in no acute distress.  HENT:  Head: Normocephalic and atraumatic.  Nose: Nose normal.  Eyes: Conjunctivae and EOM are normal. Left eye exhibits no discharge. No scleral icterus.  Neck: Normal range of motion. Neck supple.  Cardiovascular: Normal rate, regular rhythm, normal heart sounds and intact distal pulses.  Exam reveals no gallop and no friction rub.   No murmur heard. Pulmonary/Chest: Effort normal and breath sounds normal. No respiratory distress.  Abdominal: Soft. Bowel sounds are normal. He exhibits no distension. There is tenderness. There is no guarding.    Musculoskeletal: Normal range of motion. He exhibits no edema.  Neurological: He is alert. He exhibits normal muscle tone. Coordination normal.  Skin: Skin is warm and dry. No rash noted. He is not diaphoretic.  Psychiatric: He has a normal mood and affect.  Nursing note and vitals reviewed.    ED Treatments / Results  Labs (all labs ordered are listed, but only abnormal results are displayed) Labs Reviewed  LIPASE, BLOOD - Abnormal; Notable for the following:       Result Value   Lipase 59 (*)    All other components within normal limits  COMPREHENSIVE METABOLIC PANEL - Abnormal; Notable for the following:    Chloride 96 (*)    Creatinine, Ser 5.57 (*)    Calcium 8.2 (*)    ALT 11 (*)    GFR calc non Af Amer 10 (*)    GFR calc Af  Amer 11 (*)    All other components within normal limits  CBC - Abnormal; Notable for the following:    WBC 3.9 (*)    RBC 3.28 (*)    Hemoglobin 10.8 (*)    HCT 35.1 (*)    MCV 107.0 (*)    RDW 15.8 (*)    Platelets 115 (*)    All other components within normal limits  URINALYSIS, ROUTINE W REFLEX MICROSCOPIC    EKG  EKG Interpretation None       Radiology Ct Abdomen Pelvis W Contrast  Result Date: 04/28/2017 CLINICAL DATA:  61 y/o  M; 3 days of generalized abdominal pain. EXAM: CT ABDOMEN AND PELVIS WITH CONTRAST TECHNIQUE: Multidetector CT imaging of the abdomen and pelvis was performed using the standard protocol following bolus administration of intravenous contrast. CONTRAST:  95 cc Isovue-300 COMPARISON:  05/07/2010 CT abdomen and pelvis. FINDINGS: Lower chest: No acute abnormality. Hepatobiliary: No focal liver abnormality is seen. No gallstones, gallbladder wall thickening, or biliary dilatation. Pancreas: Unremarkable. No pancreatic ductal dilatation or surrounding inflammatory changes. Spleen: Normal in size without focal abnormality. Adrenals/Urinary Tract: Normal adrenal glands. Atrophic kidneys. Multiple fluid attenuating well circumcised lesions in the kidneys measuring up to 22 mm in left interpolar kidney compatible with cysts. No hydronephrosis. Collapsed bladder. Stomach/Bowel: Stomach is within normal limits. Appendix appears normal. No evidence of bowel wall thickening, distention, or inflammatory changes. Vascular/Lymphatic: Aortic atherosclerosis. 26 mm infrarenal abdominal aortic ectasia. No enlarged abdominal or pelvic lymph nodes. Reproductive: Prostate is unremarkable. Other: No abdominal wall hernia or abnormality. No abdominopelvic ascites. Musculoskeletal: Chronic bilateral posterior rib fractures. No acute fracture identified. Lumbar spine degenerative changes with multilevel disc space narrowing, disc bulges, and lower lumbar facet arthrosis. Canal stenosis  greatest at L4-5 level where it is probably moderate. IMPRESSION: 1. No acute process identified. 2. Aortic atherosclerosis. 26 mm infrarenal abdominal aortic ectasia. Ectatic abdominal aorta at risk for aneurysm development. Recommend followup by ultrasound in 5 years. This recommendation follows ACR consensus guidelines: White Paper of the ACR Incidental Findings Committee II on Vascular Findings. J Am Coll Radiol 2013; 10:789-794. 3. Atrophic kidneys with multiple bilateral cysts. No hydronephrosis. Collapsed bladder. Electronically Signed   By: Kristine Garbe M.D.   On:  04/28/2017 23:43    Procedures Procedures (including critical care time)  Medications Ordered in ED Medications  ondansetron (ZOFRAN-ODT) disintegrating tablet 4 mg (not administered)  gi cocktail (Maalox,Lidocaine,Donnatal) (30 mLs Oral Given 04/29/17 0019)     Initial Impression / Assessment and Plan / ED Course  I have reviewed the triage vital signs and the nursing notes.  Pertinent labs & imaging results that were available during my care of the patient were reviewed by me and considered in my medical decision making (see chart for details).     Patient presents to ED for evaluation of one-week history of sharp, right lower quadrant abdominal pain. States that the pain shifts in different parts of his abdomen. He also reports episodes of vomiting. On physical exam patient is nontoxic-appearing and in no acute distress. He does have some tenderness to palpation of the right lower quadrant. He is afebrile with no history of fever.Lab work including CMP, CBC similar to previous values. Lipase unremarkable. Patient unable to urinate due to being on dialysis. CT of the abdomen and pelvis returned as negative for acute abnormality. Patient reports much improvement in symptoms with GI cocktail given here in the ED. I suspect mild gastritis being the cause of his abdominal pain. We'll discharge with Prilosec and advised  follow-up with GI specialist for further evaluation. Patient appears stable for discharge at this time. Strict return precautions given.  Final Clinical Impressions(s) / ED Diagnoses   Final diagnoses:  Acute gastritis without hemorrhage, unspecified gastritis type    New Prescriptions New Prescriptions   OMEPRAZOLE (PRILOSEC) 20 MG CAPSULE    Take 1 capsule (20 mg total) by mouth daily.     Delia Heady, PA-C 04/29/17 0059    Tanna Furry, MD 05/12/17 [phone]

## 2017-04-29 ENCOUNTER — Encounter (HOSPITAL_COMMUNITY): Payer: Self-pay | Admitting: Emergency Medicine

## 2017-04-29 ENCOUNTER — Emergency Department (HOSPITAL_COMMUNITY)
Admission: EM | Admit: 2017-04-29 | Discharge: 2017-04-29 | Disposition: A | Discharge status: Home / Self Care | Payer: Medicare Other | Attending: Emergency Medicine | Admitting: Emergency Medicine

## 2017-04-29 DIAGNOSIS — I120 Hypertensive chronic kidney disease with stage 5 chronic kidney disease or end stage renal disease: Secondary | ICD-10-CM | POA: Insufficient documentation

## 2017-04-29 DIAGNOSIS — Z79899 Other long term (current) drug therapy: Secondary | ICD-10-CM | POA: Insufficient documentation

## 2017-04-29 DIAGNOSIS — N186 End stage renal disease: Secondary | ICD-10-CM | POA: Insufficient documentation

## 2017-04-29 DIAGNOSIS — S52201D Unspecified fracture of shaft of right ulna, subsequent encounter for closed fracture with routine healing: Secondary | ICD-10-CM | POA: Diagnosis not present

## 2017-04-29 DIAGNOSIS — Z87891 Personal history of nicotine dependence: Secondary | ICD-10-CM | POA: Insufficient documentation

## 2017-04-29 DIAGNOSIS — Y33XXXD Other specified events, undetermined intent, subsequent encounter: Secondary | ICD-10-CM | POA: Diagnosis not present

## 2017-04-29 DIAGNOSIS — J449 Chronic obstructive pulmonary disease, unspecified: Secondary | ICD-10-CM | POA: Diagnosis not present

## 2017-04-29 DIAGNOSIS — K2900 Acute gastritis without bleeding: Secondary | ICD-10-CM | POA: Diagnosis not present

## 2017-04-29 DIAGNOSIS — S52202D Unspecified fracture of shaft of left ulna, subsequent encounter for closed fracture with routine healing: Secondary | ICD-10-CM | POA: Insufficient documentation

## 2017-04-29 DIAGNOSIS — Z992 Dependence on renal dialysis: Secondary | ICD-10-CM | POA: Insufficient documentation

## 2017-04-29 MED ORDER — OMEPRAZOLE 20 MG PO CPDR: 20.0000 mg | DELAYED_RELEASE_CAPSULE | Freq: Every day | ORAL | 0 refills | Status: DC

## 2017-04-29 MED ORDER — OXYCODONE-ACETAMINOPHEN 5-325 MG PO TABS
1.0000 | ORAL_TABLET | Freq: Once | ORAL | Status: AC
  Administered 2017-04-29: 1 via ORAL
  Filled 2017-04-29: qty 1

## 2017-04-29 MED ORDER — TRAMADOL HCL 50 MG PO TABS: 50.0000 mg | ORAL_TABLET | Freq: Four times a day (QID) | ORAL | 0 refills | Status: DC | PRN

## 2017-04-29 NOTE — ED Notes (Signed)
Ortho paged to place splint.

## 2017-04-29 NOTE — Progress Notes (Signed)
Orthopedic Tech Progress Note Patient Details:  Raymond [name] [birth year] [id number]  Ortho Devices Type of Ortho Device: Arm sling, Ace wrap, Long arm splint Ortho Device/Splint Interventions: Application   [name] 04/29/2017, 7:24 AM

## 2017-04-29 NOTE — Discharge Instructions (Signed)
Please read attached information regarding your condition. Take Prilosec as needed 4 abdominal discomfort. Follow-up with GI specialist listed below for further evaluation. Return to ED for worsening abdominal pain, increased vomiting, lightheadedness, loss of consciousness.

## 2017-04-29 NOTE — ED Triage Notes (Signed)
Reports being diagnosed with a broken arm three weeks ago..  Unable to cast due to av fistula in the left arm.  To see ortho in 2 weeks.  Out of pain medication

## 2017-04-29 NOTE — ED Provider Notes (Signed)
[place] EMERGENCY DEPARTMENT Provider Note   CSN: [id number] Arrival date & time: 04/29/17  0224     History   Chief Complaint Chief Complaint  Patient presents with  . broken arm    HPI Raymond [name] is a 61 y.o. male.  The history is provided by the patient.  He had suffered a left forearm fracture 1 month ago.  It was treated with a splint.  He threw the splint away because it had been bled up on during dialysis.  He is complaining of pain at the fracture site.  He has run out of his pain medication.  Past Medical History:  Diagnosis Date  . Anemia   . ESRD (end stage renal disease) on dialysis [facility])    "MWF; Jeneen Rinks" (12/05/2016)  . Hepatitis C    Still positive s/p liver biopsy at [facility]  and interferon therapy for 6 months. Most recent lab work was on 10/24/12  . Hepatitis C    "took the tx; gone now" (12/05/2016)  . History of blood transfusion ~ 2012/2013   "related to my kidneys; blood was low"  . Hypertension   . Substance abuse ([place])   . Thyroid disease     Patient Active Problem List   Diagnosis Date Noted  . Acute bronchitis   . COPD exacerbation ([place])   . End-stage renal disease on hemodialysis ([place])   . Hypoxia 12/05/2016  . Hyperkalemia 12/19/2015  . Hypoglycemia 12/19/2015  . Polysubstance abuse ([place]) 12/19/2015  . Homelessness 12/19/2015  . Anemia associated with stage 5 chronic renal failure ([place]) 12/19/2015  . Chest pain 12/19/2013  . Pain in limb 12/04/2012  . End stage renal disease ([place]) 12/04/2012  . HEPATITIS C 09/07/2006  . HYPERCHOLESTEROLEMIA 09/07/2006  . HYPERTENSION, BENIGN SYSTEMIC 09/07/2006    Past Surgical History:  Procedure Laterality Date  . AV FISTULA PLACEMENT Left Aug. 2013 ?  [name] LIVER BIOPSY    . SHUNTOGRAM N/A 12/11/2012   Procedure: Earney Mallet;  Surgeon: Serafina Mitchell, MD;  Location: [facility] C[facility] LAB;  Service: Cardiovascular;  Laterality: N/A;       Home Medications    Prior to  Admission medications   Medication Sig Start Date End Date Taking? Authorizing Provider  albuterol (PROVENTIL HFA;VENTOLIN HFA) 108 (90 Base) MCG/ACT inhaler Inhale 2 puffs into the lungs every 6 (six) hours as needed for wheezing or shortness of breath. 12/06/16   Lorella Nimrod, MD  amLODipine (NORVASC) 5 MG tablet Take 5 mg by mouth daily.    [provider]  calcitRIOL (ROCALTROL) 0.25 MCG capsule Take 7 capsules (1.75 mcg total) by mouth every Monday, Wednesday, and Friday with hemodialysis. 12/07/16   Lorella Nimrod, MD  cinacalcet (SENSIPAR) 30 MG tablet Take 1 tablet (30 mg total) by mouth daily with supper. 12/21/15   Arrien, Jimmy Picket, MD  cyclobenzaprine (FLEXERIL) 5 MG tablet Take 1 tablet (5 mg total) by mouth 3 (three) times daily as needed (muscle soreness). 04/09/17   Rolland Porter, MD  folic acid (FOLVITE) 1 MG tablet Take 1 tablet (1 mg total) by mouth daily. Patient taking differently: Take 1 mg by mouth 3 (three) times a week.  12/07/16   Lorella Nimrod, MD  gabapentin (NEURONTIN) 300 MG capsule Take 300 mg by mouth at bedtime as needed (sleep).    [provider]  guaiFENesin (MUCINEX) 600 MG 12 hr tablet Take 1 tablet (600 mg total) by mouth 2 (two) times daily. Patient taking  differently: Take 600 mg by mouth 2 (two) times daily as needed for cough.  12/06/16   Lorella Nimrod, MD  HYDROcodone-acetaminophen (NORCO/VICODIN) 5-325 MG tablet Take 1 tablet by mouth every 6 (six) hours as needed for moderate pain. 02/16/16   Lawyer, Harrell Gave, PA-C  metoprolol succinate (TOPROL-XL) 25 MG 24 hr tablet Take 2 tablets (50 mg total) by mouth daily. 12/21/15   Arrien, Jimmy Picket, MD  Multiple Vitamin (MULTIVITAMIN WITH MINERALS) TABS tablet Take 1 tablet by mouth daily. Patient taking differently: Take 1 tablet by mouth 3 (three) times a week.  12/07/16   Lorella Nimrod, MD  naproxen (NAPROSYN) 250 MG tablet Take 1 tablet (250 mg total) by mouth 2 (two) times daily. 04/09/17    Rolland Porter, MD  omeprazole (PRILOSEC) 20 MG capsule Take 1 capsule (20 mg total) by mouth daily. 04/29/17   Khatri, Hina, PA-C  oxyCODONE (OXY IR/ROXICODONE) 5 MG immediate release tablet Take 1 tablet (5 mg total) by mouth every 6 (six) hours as needed for severe pain. 03/29/17   Ward, Ozella Almond, PA-C  sevelamer (RENAGEL) 800 MG tablet Take 800 mg by mouth 3 (three) times daily with meals.    [provider]  thiamine (VITAMIN B-1) 100 MG tablet Take 100 mg by mouth 3 (three) times a week.     [provider]    Family History Family History  Problem Relation Age of Onset  . Diabetes Father   . Hypertension Father     Social History Social History  Substance Use Topics  . Smoking status: Former Smoker    Packs/day: 1.00    Years: 25.00    Types: Cigarettes    Quit date: 08/01/2011  . Smokeless tobacco: Never Used  . Alcohol use 4.8 oz/week    8 Glasses of wine per week     Comment: 12/05/2016 "~ 1 pint/day; twice/week; wine; no liquor"     Allergies   Patient has no known allergies.   Review of Systems Review of Systems  All other systems reviewed and are negative.    Physical Exam Updated Vital Signs BP (!) 92/49 (BP Location: Right Arm)   Pulse 86   Temp 98.4 F (36.9 C) (Oral)   Resp 18   SpO2 97%   Physical Exam  Nursing note and vitals reviewed.  61 year old male, resting comfortably and in no acute distress. Vital signs are normal. Oxygen saturation is 97%, which is normal. Head is normocephalic and atraumatic. PERRLA, EOMI. Oropharynx is clear. Neck is nontender and supple without adenopathy or JVD. Back is nontender and there is no CVA tenderness. Lungs are clear without rales, wheezes, or rhonchi. Chest is nontender. Heart has regular rate and rhythm without murmur. Abdomen is soft, flat, nontender without masses or hepatosplenomegaly and peristalsis is normoactive. Extremities have no cyanosis or edema, full range of motion is  present. Skin is warm and dry without rash. Neurologic: Mental status is normal, cranial nerves are intact, there are no motor or sensory deficits.  ED Treatments / Results  Labs (all labs ordered are listed, but only abnormal results are displayed) Labs Reviewed - No data to display  EKG  EKG Interpretation None       Radiology Ct Abdomen Pelvis W Contrast  Result Date: 04/28/2017 CLINICAL DATA:  61 y/o  M; 3 days of generalized abdominal pain. EXAM: CT ABDOMEN AND PELVIS WITH CONTRAST TECHNIQUE: Multidetector CT imaging of the abdomen and pelvis was performed using the standard  protocol following bolus administration of intravenous contrast. CONTRAST:  95 cc Isovue-300 COMPARISON:  05/07/2010 CT abdomen and pelvis. FINDINGS: Lower chest: No acute abnormality. Hepatobiliary: No focal liver abnormality is seen. No gallstones, gallbladder wall thickening, or biliary dilatation. Pancreas: Unremarkable. No pancreatic ductal dilatation or surrounding inflammatory changes. Spleen: Normal in size without focal abnormality. Adrenals/Urinary Tract: Normal adrenal glands. Atrophic kidneys. Multiple fluid attenuating well circumcised lesions in the kidneys measuring up to 22 mm in left interpolar kidney compatible with cysts. No hydronephrosis. Collapsed bladder. Stomach/Bowel: Stomach is within normal limits. Appendix appears normal. No evidence of bowel wall thickening, distention, or inflammatory changes. Vascular/Lymphatic: Aortic atherosclerosis. 26 mm infrarenal abdominal aortic ectasia. No enlarged abdominal or pelvic lymph nodes. Reproductive: Prostate is unremarkable. Other: No abdominal wall hernia or abnormality. No abdominopelvic ascites. Musculoskeletal: Chronic bilateral posterior rib fractures. No acute fracture identified. Lumbar spine degenerative changes with multilevel disc space narrowing, disc bulges, and lower lumbar facet arthrosis. Canal stenosis greatest at L4-5 level where it is  probably moderate. IMPRESSION: 1. No acute process identified. 2. Aortic atherosclerosis. 26 mm infrarenal abdominal aortic ectasia. Ectatic abdominal aorta at risk for aneurysm development. Recommend followup by ultrasound in 5 years. This recommendation follows ACR consensus guidelines: White Paper of the ACR Incidental Findings Committee II on Vascular Findings. J Am Coll Radiol 2013; 10:789-794. 3. Atrophic kidneys with multiple bilateral cysts. No hydronephrosis. Collapsed bladder. Electronically Signed   By: Kristine Garbe M.D.   On: 04/28/2017 23:43    Procedures .Splint Application Date/Time: 96/75/9163 6:27 AM Performed by: Delora Fuel Authorized by: Roxanne Mins, Cardelia Sassano   Consent:    Consent obtained:  Verbal   Consent given by:  Patient   Risks discussed:  Numbness, pain and swelling   Alternatives discussed:  No treatment Pre-procedure details:    Sensation:  Normal Procedure details:    Laterality:  Left   Location:  Arm   Arm:  L lower arm   Strapping: no     Splint type:  Long arm   Supplies:  Elastic bandage, sling, cotton padding and Ortho-Glass Post-procedure details:    Pain:  Improved   Sensation:  Normal   Patient tolerance of procedure:  Tolerated well, no immediate complications   (including critical care time)  Medications Ordered in ED Medications  oxyCODONE-acetaminophen (PERCOCET/ROXICET) 5-325 MG per tablet 1 tablet (not administered)     Initial Impression / Assessment and Plan / ED Course  I have reviewed the triage vital signs and the nursing notes.  Pertinent labs & imaging results that were available during my care of the patient were reviewed by me and considered in my medical decision making (see chart for details).  Pain at site of recent fracture.  Old records were reviewed confirming ED visit on September 19 for fracture of proximal ulna.  This is the same arm that has an AV fistula for dialysis, and decision was made to treat  conservatively.  Patient would benefit from immobilization.  Long-arm splint is applied and is discharged with prescription for tramadol.  He has follow-up appointment with orthopedics on October 31.  Advised to have the splint removed during dialysis and reapplied when he is done.  Final Clinical Impressions(s) / ED Diagnoses   Final diagnoses:  None    New Prescriptions New Prescriptions   No medications on file     Delora Fuel, MD 84/66/59 [phone]

## 2017-04-29 NOTE — Discharge Instructions (Signed)
Wear the splint and use the sling. At dialysis, remove the splint and put it back on when you are finished.

## 2017-05-01 DIAGNOSIS — N186 End stage renal disease: Secondary | ICD-10-CM | POA: Diagnosis not present

## 2017-05-01 DIAGNOSIS — N2581 Secondary hyperparathyroidism of renal origin: Secondary | ICD-10-CM | POA: Diagnosis not present

## 2017-05-01 DIAGNOSIS — D509 Iron deficiency anemia, unspecified: Secondary | ICD-10-CM | POA: Diagnosis not present

## 2017-05-01 DIAGNOSIS — D631 Anemia in chronic kidney disease: Secondary | ICD-10-CM | POA: Diagnosis not present

## 2017-05-03 DIAGNOSIS — D631 Anemia in chronic kidney disease: Secondary | ICD-10-CM | POA: Diagnosis not present

## 2017-05-03 DIAGNOSIS — N186 End stage renal disease: Secondary | ICD-10-CM | POA: Diagnosis not present

## 2017-05-03 DIAGNOSIS — D509 Iron deficiency anemia, unspecified: Secondary | ICD-10-CM | POA: Diagnosis not present

## 2017-05-03 DIAGNOSIS — N2581 Secondary hyperparathyroidism of renal origin: Secondary | ICD-10-CM | POA: Diagnosis not present

## 2017-05-05 DIAGNOSIS — N2581 Secondary hyperparathyroidism of renal origin: Secondary | ICD-10-CM | POA: Diagnosis not present

## 2017-05-05 DIAGNOSIS — N186 End stage renal disease: Secondary | ICD-10-CM | POA: Diagnosis not present

## 2017-05-05 DIAGNOSIS — D509 Iron deficiency anemia, unspecified: Secondary | ICD-10-CM | POA: Diagnosis not present

## 2017-05-05 DIAGNOSIS — D631 Anemia in chronic kidney disease: Secondary | ICD-10-CM | POA: Diagnosis not present

## 2017-05-08 DIAGNOSIS — N186 End stage renal disease: Secondary | ICD-10-CM | POA: Diagnosis not present

## 2017-05-08 DIAGNOSIS — N2581 Secondary hyperparathyroidism of renal origin: Secondary | ICD-10-CM | POA: Diagnosis not present

## 2017-05-08 DIAGNOSIS — D631 Anemia in chronic kidney disease: Secondary | ICD-10-CM | POA: Diagnosis not present

## 2017-05-08 DIAGNOSIS — D509 Iron deficiency anemia, unspecified: Secondary | ICD-10-CM | POA: Diagnosis not present

## 2017-05-09 DIAGNOSIS — S52252D Displaced comminuted fracture of shaft of ulna, left arm, subsequent encounter for closed fracture with routine healing: Secondary | ICD-10-CM | POA: Diagnosis not present

## 2017-05-10 DIAGNOSIS — N2889 Other specified disorders of kidney and ureter: Secondary | ICD-10-CM | POA: Diagnosis not present

## 2017-05-10 DIAGNOSIS — Z992 Dependence on renal dialysis: Secondary | ICD-10-CM | POA: Diagnosis not present

## 2017-05-10 DIAGNOSIS — N186 End stage renal disease: Secondary | ICD-10-CM | POA: Diagnosis not present

## 2017-05-12 DIAGNOSIS — N186 End stage renal disease: Secondary | ICD-10-CM | POA: Diagnosis not present

## 2017-05-12 DIAGNOSIS — N2581 Secondary hyperparathyroidism of renal origin: Secondary | ICD-10-CM | POA: Diagnosis not present

## 2017-05-12 DIAGNOSIS — D631 Anemia in chronic kidney disease: Secondary | ICD-10-CM | POA: Diagnosis not present

## 2017-05-12 DIAGNOSIS — D509 Iron deficiency anemia, unspecified: Secondary | ICD-10-CM | POA: Diagnosis not present

## 2017-05-15 DIAGNOSIS — D631 Anemia in chronic kidney disease: Secondary | ICD-10-CM | POA: Diagnosis not present

## 2017-05-15 DIAGNOSIS — N2581 Secondary hyperparathyroidism of renal origin: Secondary | ICD-10-CM | POA: Diagnosis not present

## 2017-05-15 DIAGNOSIS — D509 Iron deficiency anemia, unspecified: Secondary | ICD-10-CM | POA: Diagnosis not present

## 2017-05-15 DIAGNOSIS — N186 End stage renal disease: Secondary | ICD-10-CM | POA: Diagnosis not present

## 2017-05-16 ENCOUNTER — Encounter (HOSPITAL_COMMUNITY): Payer: Self-pay | Admitting: *Deleted

## 2017-05-16 ENCOUNTER — Ambulatory Visit (HOSPITAL_COMMUNITY): Payer: Medicare Other | Admitting: Emergency Medicine

## 2017-05-16 ENCOUNTER — Other Ambulatory Visit: Payer: Self-pay

## 2017-05-16 NOTE — Progress Notes (Signed)
Raymond [name] denies chest pain, "I get short of breath when I have too much fluid."  I asked patient if he has a PCP, "not really."   I asked patient if he had been informed that he has an aneurysm and needs to follow up with a MD, he said yes, "they told me something about it, I'll have to make an appointment, I'm in Drug and Alcohol class and I have to go to those, so I will make an appointment.  Raymond [name] reports that the last time he used cocaine was approximately 2- 3 weeks ago.  Patient states that he will be coming to the hospital from dialysis tomorrow, patient asked me to call Ms Olivia Mackie who runs the Alcohcol and Drug classes and let her know that he will not be there tomorrow.  I call and spoke with Ms Olivia Mackie.

## 2017-05-16 NOTE — Progress Notes (Signed)
Anesthesia Chart Review:  Pt is a same day work up.   Pt is a L forearm ORIF ulnar shaft fracture on 05/17/2017 with Iran Planas, MD  PMH includes:  HTN, hepatitis C, anemia, ESRD on hemodialysis, thyroid disease.  Hx cocaine abuse, alcohol abuse. Former smoker.   Medications include: albuterol, amlodipine, folic acid, metoprolol, thiamine  Labs will be obtained day of surgery  CXR 04/09/17:  - Stable appearance of the chest without acute cardiopulmonary disease. Borderline cardiomegaly.  CT angio chest 04/09/17:  1. Technically adequate exam showing no acute pulmonary embolus. 2. No parenchymal changes typical of fat embolus. 3. Significant coronary artery disease and cardiomegaly. 4.  Aortic atherosclerosis.  (ICD10-I70.0) 5. 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 Recommend semi-annual imaging followup by CTA or MRA and referral to cardiothoracic surgery if not already obtained.   EKG 04/08/17: Sinus rhythm with 1st degree A-V block. Prolonged QT  Echo 12/06/16:  - Left ventricle: The cavity size was normal. Wall thickness was increased in a pattern of moderate LVH. Systolic function was normal. The estimated ejection fraction was in the range of 55% to 60%. Wall motion was normal; there were no regional wall motion abnormalities. Doppler parameters are consistent with abnormal left ventricular relaxation (grade 1 diastolic dysfunction). The E/e&' ratio is between 8-15, suggesting indeterminate LV filling pressure. - Aorta: Ascending aortic diameter: 42 mm (S). - Ascending aorta: The ascending aorta was mildly dilated. - Mitral valve: Calcified annulus. There was trivial regurgitation. - Left atrium: Moderately dilated. - Right atrium: The atrium was mildly dilated. - Atrial septum: Mobile IAS - cannot exclude small PFO. - Tricuspid valve: There was mild regurgitation. - Pulmonary arteries: PA peak pressure: 47 mm Hg (S). - Systemic veins: The IVC measures >2.1  cm, but collapses more than 50%, suggesting an elevated RA pressure of 8 mmHg.  If labs acceptable day of surgery, I anticipate pt can proceed as scheduled.   Willeen Cass, FNP-BC [facility]/Anesthesiology Phone: [phone] 05/16/2017 11:57 AM

## 2017-05-17 ENCOUNTER — Non-Acute Institutional Stay (HOSPITAL_COMMUNITY)
Admission: RE | Admit: 2017-05-17 | Discharge: 2017-05-17 | Disposition: A | Discharge status: Home / Self Care | Payer: Medicare Other | Source: Ambulatory Visit | Attending: Orthopedic Surgery | Admitting: Orthopedic Surgery

## 2017-05-17 ENCOUNTER — Encounter (HOSPITAL_COMMUNITY)
Admission: RE | Disposition: A | Discharge status: Home / Self Care | Payer: Self-pay | Source: Ambulatory Visit | Attending: Orthopedic Surgery

## 2017-05-17 ENCOUNTER — Encounter (HOSPITAL_COMMUNITY): Payer: Self-pay | Admitting: *Deleted

## 2017-05-17 DIAGNOSIS — D649 Anemia, unspecified: Secondary | ICD-10-CM | POA: Diagnosis not present

## 2017-05-17 DIAGNOSIS — E875 Hyperkalemia: Secondary | ICD-10-CM | POA: Diagnosis present

## 2017-05-17 DIAGNOSIS — N186 End stage renal disease: Secondary | ICD-10-CM | POA: Insufficient documentation

## 2017-05-17 DIAGNOSIS — Z992 Dependence on renal dialysis: Secondary | ICD-10-CM | POA: Diagnosis not present

## 2017-05-17 DIAGNOSIS — I10 Essential (primary) hypertension: Secondary | ICD-10-CM | POA: Diagnosis not present

## 2017-05-17 DIAGNOSIS — Z87891 Personal history of nicotine dependence: Secondary | ICD-10-CM | POA: Insufficient documentation

## 2017-05-17 DIAGNOSIS — S52252A Displaced comminuted fracture of shaft of ulna, left arm, initial encounter for closed fracture: Secondary | ICD-10-CM | POA: Diagnosis not present

## 2017-05-17 HISTORY — DX: Dyspnea, unspecified: R06.00

## 2017-05-17 LAB — BASIC METABOLIC PANEL
Anion gap: 14 (ref 5–15)
BUN: 46 mg/dL — AB (ref 6–20)
CHLORIDE: 98 mmol/L — AB (ref 101–111)
CO2: 23 mmol/L (ref 22–32)
CREATININE: 12.84 mg/dL — AB (ref 0.61–1.24)
Calcium: 7.9 mg/dL — ABNORMAL LOW (ref 8.9–10.3)
GFR, EST AFRICAN AMERICAN: 4 mL/min — AB (ref >60)
GFR, EST NON AFRICAN AMERICAN: 4 mL/min — AB (ref >60)
Glucose, Bld: 62 mg/dL — ABNORMAL LOW (ref 65–99)
Potassium: 6.7 mmol/L (ref 3.5–5.1)
SODIUM: 135 mmol/L (ref 135–145)

## 2017-05-17 LAB — POCT I-STAT 4, (NA,K, GLUC, HGB,HCT)
GLUCOSE: 69 mg/dL (ref 65–99)
HCT: 33 % — ABNORMAL LOW (ref 39.0–52.0)
HEMOGLOBIN: 11.2 g/dL — AB (ref 13.0–17.0)
POTASSIUM: 5.9 mmol/L — AB (ref 3.5–5.1)
Sodium: 136 mmol/L (ref 135–145)

## 2017-05-17 SURGERY — OPEN REDUCTION INTERNAL FIXATION (ORIF) ULNAR FRACTURE
Anesthesia: General | Laterality: Left

## 2017-05-17 MED ORDER — METOPROLOL SUCCINATE ER 50 MG PO TB24
50.0000 mg | ORAL_TABLET | ORAL | Status: AC
  Administered 2017-05-17: 50 mg via ORAL
  Filled 2017-05-17: qty 1

## 2017-05-17 MED ORDER — SODIUM CHLORIDE 0.9 % IV SOLN: 100.0000 mL | INTRAVENOUS | Status: DC | PRN

## 2017-05-17 MED ORDER — CHLORHEXIDINE GLUCONATE 4 % EX LIQD: 60.0000 mL | Freq: Once | CUTANEOUS | Status: DC

## 2017-05-17 MED ORDER — PROPOFOL 10 MG/ML IV BOLUS
INTRAVENOUS | Status: AC
  Filled 2017-05-17: qty 20

## 2017-05-17 MED ORDER — HEPARIN SODIUM (PORCINE) 1000 UNIT/ML DIALYSIS
5000.0000 [IU] | INTRAMUSCULAR | Status: DC | PRN
Start: 2017-05-17 — End: 2017-05-18
  Administered 2017-05-17: 5000 [IU] via INTRAVENOUS_CENTRAL
  Filled 2017-05-17 (×2): qty 5

## 2017-05-17 MED ORDER — BUPIVACAINE HCL (PF) 0.25 % IJ SOLN
INTRAMUSCULAR | Status: AC
  Filled 2017-05-17: qty 30

## 2017-05-17 MED ORDER — LIDOCAINE 2% (20 MG/ML) 5 ML SYRINGE
INTRAMUSCULAR | Status: AC
  Filled 2017-05-17: qty 5

## 2017-05-17 MED ORDER — LIDOCAINE HCL (PF) 1 % IJ SOLN: 5.0000 mL | INTRAMUSCULAR | Status: DC | PRN

## 2017-05-17 MED ORDER — OXYCODONE-ACETAMINOPHEN 5-325 MG PO TABS
2.0000 | ORAL_TABLET | Freq: Four times a day (QID) | ORAL | Status: DC | PRN
  Administered 2017-05-17: 2 via ORAL

## 2017-05-17 MED ORDER — LIDOCAINE-PRILOCAINE 2.5-2.5 % EX CREA: 1.0000 "application " | TOPICAL_CREAM | CUTANEOUS | Status: DC | PRN

## 2017-05-17 MED ORDER — PENTAFLUOROPROP-TETRAFLUOROETH EX AERO: 1.0000 "application " | INHALATION_SPRAY | CUTANEOUS | Status: DC | PRN

## 2017-05-17 MED ORDER — OXYCODONE-ACETAMINOPHEN 5-325 MG PO TABS
ORAL_TABLET | ORAL | Status: AC
  Administered 2017-05-17: 2 via ORAL
  Filled 2017-05-17: qty 2

## 2017-05-17 MED ORDER — MIDAZOLAM HCL 5 MG/5ML IJ SOLN
INTRAMUSCULAR | Status: AC | PRN
  Administered 2017-05-17: 1 mg via INTRAVENOUS
  Administered 2018-04-11: 2 mg via INTRAVENOUS

## 2017-05-17 MED ORDER — HEPARIN SODIUM (PORCINE) 1000 UNIT/ML DIALYSIS: 1000.0000 [IU] | INTRAMUSCULAR | Status: DC | PRN

## 2017-05-17 MED ORDER — MIDAZOLAM HCL 2 MG/2ML IJ SOLN
INTRAMUSCULAR | Status: AC
  Filled 2017-05-17: qty 2

## 2017-05-17 MED ORDER — CEFAZOLIN SODIUM-DEXTROSE 2-4 GM/100ML-% IV SOLN
2.0000 g | INTRAVENOUS | Status: DC
  Filled 2017-05-17: qty 100

## 2017-05-17 MED ORDER — FENTANYL CITRATE (PF) 250 MCG/5ML IJ SOLN
INTRAMUSCULAR | Status: AC
  Filled 2017-05-17: qty 5

## 2017-05-17 MED ORDER — ROCURONIUM BROMIDE 10 MG/ML (PF) SYRINGE
PREFILLED_SYRINGE | INTRAVENOUS | Status: AC
  Filled 2017-05-17: qty 5

## 2017-05-17 MED ORDER — ALTEPLASE 2 MG IJ SOLR: 2.0000 mg | Freq: Once | INTRAMUSCULAR | Status: DC | PRN

## 2017-05-17 MED ORDER — SODIUM CHLORIDE 0.9 % IV SOLN
INTRAVENOUS | Status: DC
  Administered 2017-05-17: 15:00:00 via INTRAVENOUS

## 2017-05-17 NOTE — Progress Notes (Signed)
Patient's surgery cancelled due to hyperkalemia. Dr. Caralyn Guile called and stated to take patient to the ER for the hospitalist to evaluate. New orders received and carried out.

## 2017-05-17 NOTE — Progress Notes (Signed)
Hemodialysis called and stated they were ready to dialyze patient. Patient taken straight to HD. Report given to RN. Verbalized understanding.

## 2017-05-17 NOTE — Progress Notes (Signed)
iStat results called to Dr. Sabra Heck. No new orders at this time.

## 2017-05-17 NOTE — Progress Notes (Signed)
Hemodialysis:  Pt received HD tx d/t hyperkalemia, and tolerated well.  Cab voucher obtained via house supervisor.  Pt dc'd to home, aware to return in the a.m. For scheduled procedure.  Ambulatory to the ER lobby to wait on cab.

## 2017-05-18 ENCOUNTER — Ambulatory Visit (HOSPITAL_COMMUNITY)
Admission: RE | Admit: 2017-05-18 | Discharge: 2017-05-18 | Disposition: A | Discharge status: Home / Self Care | Payer: Medicare Other | Source: Ambulatory Visit | Attending: Orthopedic Surgery | Admitting: Orthopedic Surgery

## 2017-05-18 ENCOUNTER — Inpatient Hospital Stay (HOSPITAL_COMMUNITY): Payer: Medicare Other | Admitting: Anesthesiology

## 2017-05-18 ENCOUNTER — Other Ambulatory Visit: Payer: Self-pay

## 2017-05-18 ENCOUNTER — Encounter (HOSPITAL_COMMUNITY)
Admission: RE | Disposition: A | Discharge status: Home / Self Care | Payer: Self-pay | Source: Ambulatory Visit | Attending: Orthopedic Surgery

## 2017-05-18 ENCOUNTER — Encounter (HOSPITAL_COMMUNITY): Payer: Self-pay | Admitting: Surgery

## 2017-05-18 DIAGNOSIS — I120 Hypertensive chronic kidney disease with stage 5 chronic kidney disease or end stage renal disease: Secondary | ICD-10-CM | POA: Diagnosis not present

## 2017-05-18 DIAGNOSIS — D649 Anemia, unspecified: Secondary | ICD-10-CM | POA: Insufficient documentation

## 2017-05-18 DIAGNOSIS — S52202A Unspecified fracture of shaft of left ulna, initial encounter for closed fracture: Secondary | ICD-10-CM | POA: Diagnosis not present

## 2017-05-18 DIAGNOSIS — N186 End stage renal disease: Secondary | ICD-10-CM

## 2017-05-18 DIAGNOSIS — D631 Anemia in chronic kidney disease: Secondary | ICD-10-CM | POA: Diagnosis not present

## 2017-05-18 DIAGNOSIS — J449 Chronic obstructive pulmonary disease, unspecified: Secondary | ICD-10-CM | POA: Diagnosis not present

## 2017-05-18 DIAGNOSIS — Z992 Dependence on renal dialysis: Secondary | ICD-10-CM | POA: Insufficient documentation

## 2017-05-18 DIAGNOSIS — Z87891 Personal history of nicotine dependence: Secondary | ICD-10-CM | POA: Insufficient documentation

## 2017-05-18 DIAGNOSIS — S52202P Unspecified fracture of shaft of left ulna, subsequent encounter for closed fracture with malunion: Secondary | ICD-10-CM | POA: Diagnosis not present

## 2017-05-18 HISTORY — PX: ORIF ULNAR FRACTURE: SHX5417

## 2017-05-18 LAB — POCT I-STAT 4, (NA,K, GLUC, HGB,HCT)
GLUCOSE: 116 mg/dL — AB (ref 65–99)
Glucose, Bld: 68 mg/dL (ref 65–99)
HCT: 32 % — ABNORMAL LOW (ref 39.0–52.0)
HEMATOCRIT: 32 % — AB (ref 39.0–52.0)
HEMOGLOBIN: 10.9 g/dL — AB (ref 13.0–17.0)
Hemoglobin: 10.9 g/dL — ABNORMAL LOW (ref 13.0–17.0)
Potassium: 6.1 mmol/L — ABNORMAL HIGH (ref 3.5–5.1)
Potassium: 4.2 mmol/L (ref 3.5–5.1)
SODIUM: 137 mmol/L (ref 135–145)
Sodium: 138 mmol/L (ref 135–145)

## 2017-05-18 SURGERY — OPEN REDUCTION INTERNAL FIXATION (ORIF) ULNAR FRACTURE
Anesthesia: General | Laterality: Left

## 2017-05-18 MED ORDER — MIDAZOLAM HCL 2 MG/2ML IJ SOLN
INTRAMUSCULAR | Status: AC
  Filled 2017-05-18: qty 2

## 2017-05-18 MED ORDER — FENTANYL CITRATE (PF) 100 MCG/2ML IJ SOLN
25.0000 ug | INTRAMUSCULAR | Status: DC | PRN
  Administered 2017-05-18: 50 ug via INTRAVENOUS

## 2017-05-18 MED ORDER — LIDOCAINE 2% (20 MG/ML) 5 ML SYRINGE
INTRAMUSCULAR | Status: AC
  Filled 2017-05-18: qty 5

## 2017-05-18 MED ORDER — BUPIVACAINE HCL (PF) 0.25 % IJ SOLN
INTRAMUSCULAR | Status: DC | PRN
  Administered 2017-05-18: 10 mL

## 2017-05-18 MED ORDER — DOCUSATE SODIUM 100 MG PO CAPS: 100.0000 mg | ORAL_CAPSULE | Freq: Two times a day (BID) | ORAL | 0 refills | Status: DC

## 2017-05-18 MED ORDER — PHENYLEPHRINE HCL 10 MG/ML IJ SOLN
INTRAMUSCULAR | Status: DC | PRN
  Administered 2017-05-18: 20 ug/min via INTRAVENOUS

## 2017-05-18 MED ORDER — CEFAZOLIN SODIUM-DEXTROSE 2-3 GM-%(50ML) IV SOLR
INTRAVENOUS | Status: DC | PRN
  Administered 2017-05-18: 2 g via INTRAVENOUS

## 2017-05-18 MED ORDER — ONDANSETRON HCL 4 MG/2ML IJ SOLN
INTRAMUSCULAR | Status: AC
  Filled 2017-05-18: qty 4

## 2017-05-18 MED ORDER — CEFAZOLIN SODIUM-DEXTROSE 2-4 GM/100ML-% IV SOLN
INTRAVENOUS | Status: DC
Start: 2017-05-18 — End: 2017-05-18
  Filled 2017-05-18: qty 100

## 2017-05-18 MED ORDER — CEPHALEXIN 500 MG PO CAPS: 500.0000 mg | ORAL_CAPSULE | Freq: Four times a day (QID) | ORAL | 0 refills | Status: AC

## 2017-05-18 MED ORDER — OXYCODONE-ACETAMINOPHEN 5-325 MG PO TABS: 1.0000 | ORAL_TABLET | Freq: Three times a day (TID) | ORAL | 0 refills | Status: AC

## 2017-05-18 MED ORDER — OXYCODONE-ACETAMINOPHEN 5-325 MG PO TABS
1.0000 | ORAL_TABLET | Freq: Once | ORAL | Status: AC
  Administered 2017-05-18: 1 via ORAL

## 2017-05-18 MED ORDER — FENTANYL CITRATE (PF) 250 MCG/5ML IJ SOLN
INTRAMUSCULAR | Status: AC
  Filled 2017-05-18: qty 5

## 2017-05-18 MED ORDER — METOPROLOL SUCCINATE ER 25 MG PO TB24
25.0000 mg | ORAL_TABLET | Freq: Once | ORAL | Status: AC
  Administered 2017-05-18: 25 mg via ORAL
  Filled 2017-05-18: qty 1

## 2017-05-18 MED ORDER — FENTANYL CITRATE (PF) 250 MCG/5ML IJ SOLN
INTRAMUSCULAR | Status: DC | PRN
  Administered 2017-05-18 (×4): 50 ug via INTRAVENOUS

## 2017-05-18 MED ORDER — PROPOFOL 10 MG/ML IV BOLUS
INTRAVENOUS | Status: AC
  Filled 2017-05-18: qty 20

## 2017-05-18 MED ORDER — SODIUM CHLORIDE 0.9 % IV SOLN
INTRAVENOUS | Status: DC
  Administered 2017-05-18: 15:00:00 via INTRAVENOUS

## 2017-05-18 MED ORDER — PROPOFOL 10 MG/ML IV BOLUS
INTRAVENOUS | Status: DC | PRN
  Administered 2017-05-18: 200 mg via INTRAVENOUS

## 2017-05-18 MED ORDER — OXYCODONE-ACETAMINOPHEN 5-325 MG PO TABS
ORAL_TABLET | ORAL | Status: AC
  Filled 2017-05-18: qty 1

## 2017-05-18 MED ORDER — DEXAMETHASONE SODIUM PHOSPHATE 10 MG/ML IJ SOLN
INTRAMUSCULAR | Status: DC | PRN
  Administered 2017-05-18: 5 mg via INTRAVENOUS

## 2017-05-18 MED ORDER — MIDAZOLAM HCL 2 MG/2ML IJ SOLN
INTRAMUSCULAR | Status: DC | PRN
  Administered 2017-05-18: 1 mg via INTRAVENOUS

## 2017-05-18 MED ORDER — DEXAMETHASONE SODIUM PHOSPHATE 10 MG/ML IJ SOLN
INTRAMUSCULAR | Status: AC
  Filled 2017-05-18: qty 2

## 2017-05-18 MED ORDER — BUPIVACAINE HCL (PF) 0.25 % IJ SOLN
INTRAMUSCULAR | Status: AC
  Filled 2017-05-18: qty 30

## 2017-05-18 MED ORDER — ROCURONIUM BROMIDE 10 MG/ML (PF) SYRINGE
PREFILLED_SYRINGE | INTRAVENOUS | Status: AC
  Filled 2017-05-18: qty 5

## 2017-05-18 MED ORDER — FENTANYL CITRATE (PF) 100 MCG/2ML IJ SOLN
INTRAMUSCULAR | Status: AC
  Administered 2017-05-18: 50 ug via INTRAVENOUS
  Filled 2017-05-18: qty 2

## 2017-05-18 MED ORDER — LIDOCAINE HCL (CARDIAC) 20 MG/ML IV SOLN
INTRAVENOUS | Status: DC | PRN
  Administered 2017-05-18: 100 mg via INTRATRACHEAL

## 2017-05-18 SURGICAL SUPPLY — 68 items
BANDAGE ACE 3X5.8 VEL STRL LF (GAUZE/BANDAGES/DRESSINGS) ×1 IMPLANT
BANDAGE ACE 4X5 VEL STRL LF (GAUZE/BANDAGES/DRESSINGS) ×2 IMPLANT
BIT DRILL 110X2.5XQCK CNCT (BIT) IMPLANT
BIT DRILL 2.5 (BIT) ×2
BIT DRILL 2.7 (BIT) ×1
BIT DRILL 2.7MM (BIT) IMPLANT
BIT DRL 110X2.5XQCK CNCT (BIT) ×1
BLADE CLIPPER SURG (BLADE) IMPLANT
BNDG CMPR 9X4 STRL LF SNTH (GAUZE/BANDAGES/DRESSINGS) ×1
BNDG ESMARK 4X9 LF (GAUZE/BANDAGES/DRESSINGS) ×2 IMPLANT
BNDG GAUZE ELAST 4 BULKY (GAUZE/BANDAGES/DRESSINGS) ×2 IMPLANT
CORDS BIPOLAR (ELECTRODE) ×2 IMPLANT
COVER SURGICAL LIGHT HANDLE (MISCELLANEOUS) ×2 IMPLANT
CUFF TOURNIQUET SINGLE 18IN (TOURNIQUET CUFF) ×2 IMPLANT
CUFF TOURNIQUET SINGLE 24IN (TOURNIQUET CUFF) IMPLANT
DRAPE OEC MINIVIEW 54X84 (DRAPES) ×2 IMPLANT
DRAPE SURG 17X11 SM STRL (DRAPES) ×2 IMPLANT
DRILL BIT 2.7MM (BIT) ×2
DRSG ADAPTIC 3X8 NADH LF (GAUZE/BANDAGES/DRESSINGS) ×2 IMPLANT
DRSG AQUACEL AG ADV 3.5X10 (GAUZE/BANDAGES/DRESSINGS) ×1 IMPLANT
ELECT REM PT RETURN 9FT ADLT (ELECTROSURGICAL)
ELECTRODE REM PT RTRN 9FT ADLT (ELECTROSURGICAL) IMPLANT
GAUZE SPONGE 4X4 12PLY STRL (GAUZE/BANDAGES/DRESSINGS) ×2 IMPLANT
GAUZE SPONGE 4X4 16PLY XRAY LF (GAUZE/BANDAGES/DRESSINGS) ×2 IMPLANT
GLOVE BIOGEL PI IND STRL 8.5 (GLOVE) ×1 IMPLANT
GLOVE BIOGEL PI INDICATOR 8.5 (GLOVE) ×1
GLOVE SURG ORTHO 8.0 STRL STRW (GLOVE) ×2 IMPLANT
GOWN STRL REUS W/ TWL LRG LVL3 (GOWN DISPOSABLE) ×1 IMPLANT
GOWN STRL REUS W/ TWL XL LVL3 (GOWN DISPOSABLE) ×1 IMPLANT
GOWN STRL REUS W/TWL LRG LVL3 (GOWN DISPOSABLE) ×2
GOWN STRL REUS W/TWL XL LVL3 (GOWN DISPOSABLE) ×2
KIT BASIN OR (CUSTOM PROCEDURE TRAY) ×2 IMPLANT
KIT ROOM TURNOVER OR (KITS) ×2 IMPLANT
MANIFOLD NEPTUNE II (INSTRUMENTS) ×2 IMPLANT
NDL HYPO 25X1 1.5 SAFETY (NEEDLE) ×1 IMPLANT
NEEDLE HYPO 25X1 1.5 SAFETY (NEEDLE) ×2 IMPLANT
NS IRRIG 1000ML POUR BTL (IV SOLUTION) ×2 IMPLANT
PACK ORTHO EXTREMITY (CUSTOM PROCEDURE TRAY) ×2 IMPLANT
PAD ARMBOARD 7.5X6 YLW CONV (MISCELLANEOUS) ×4 IMPLANT
PAD CAST 4YDX4 CTTN HI CHSV (CAST SUPPLIES) ×1 IMPLANT
PADDING CAST COTTON 4X4 STRL (CAST SUPPLIES) ×2
PLATE 8 HOLE LOCKING 3.5MM (Plate) ×1 IMPLANT
SCREW CORT 2.5X24X3.5XST SM (Screw) IMPLANT
SCREW CORT S/T 3.5X22 (Screw) ×1 IMPLANT
SCREW CORTICAL 3.5 18MM (Screw) ×4 IMPLANT
SCREW CORTICAL 3.5X24MM (Screw) ×2 IMPLANT
SCREW LOCK 20X3.5X M THRD (Screw) IMPLANT
SCREW LOCKING 18X3.5MM (Screw) ×1 IMPLANT
SCREW LOCKING 3.5X20MM (Screw) ×2 IMPLANT
SLING ARM FOAM STRAP LRG (SOFTGOODS) ×1 IMPLANT
SOAP 2 % CHG 4 OZ (WOUND CARE) ×2 IMPLANT
SPLINT FIBERGLASS 4X30 (CAST SUPPLIES) ×1 IMPLANT
SPONGE LAP 4X18 X RAY DECT (DISPOSABLE) IMPLANT
STRIP CLOSURE SKIN 1/2X4 (GAUZE/BANDAGES/DRESSINGS) IMPLANT
SUT PROLENE 2 0 FS (SUTURE) ×1 IMPLANT
SUT PROLENE 3 0 PS 2 (SUTURE) IMPLANT
SUT PROLENE 4 0 PS 2 18 (SUTURE) IMPLANT
SUT VIC AB 0 CT1 27 (SUTURE) ×2
SUT VIC AB 0 CT1 27XBRD ANBCTR (SUTURE) IMPLANT
SUT VIC AB 2-0 CT1 27 (SUTURE) ×2
SUT VIC AB 2-0 CT1 TAPERPNT 27 (SUTURE) IMPLANT
SUT VIC AB 4-0 PS2 18 (SUTURE) IMPLANT
SYR CONTROL 10ML LL (SYRINGE) IMPLANT
TOWEL OR 17X24 6PK STRL BLUE (TOWEL DISPOSABLE) ×2 IMPLANT
TOWEL OR 17X26 10 PK STRL BLUE (TOWEL DISPOSABLE) ×2 IMPLANT
TUBE CONNECTING 12X1/4 (SUCTIONS) ×2 IMPLANT
WATER STERILE IRR 1000ML POUR (IV SOLUTION) ×2 IMPLANT
YANKAUER SUCT BULB TIP NO VENT (SUCTIONS) ×1 IMPLANT

## 2017-05-18 NOTE — Anesthesia Preprocedure Evaluation (Signed)
Anesthesia Evaluation  Patient identified by MRN, date of birth, ID band Patient awake    Reviewed: Allergy & Precautions, NPO status , Patient's Chart, lab work & pertinent test results  Airway Mallampati: II  TM Distance: >3 FB     Dental   Pulmonary shortness of breath, COPD, former smoker,    breath sounds clear to auscultation       Cardiovascular hypertension,  Rhythm:Regular Rate:Normal     Neuro/Psych    GI/Hepatic negative GI ROS, (+) Hepatitis -  Endo/Other    Renal/GU Renal disease     Musculoskeletal   Abdominal   Peds  Hematology  (+) anemia ,   Anesthesia Other Findings   Reproductive/Obstetrics                             Anesthesia Physical Anesthesia Plan  ASA: III  Anesthesia Plan: General   Post-op Pain Management:    Induction: Intravenous  PONV Risk Score and Plan: 2 and Treatment may vary due to age or medical condition  Airway Management Planned: Mask  Additional Equipment:   Intra-op Plan:   Post-operative Plan: Extubation in OR  Informed Consent: I have reviewed the patients History and Physical, chart, labs and discussed the procedure including the risks, benefits and alternatives for the proposed anesthesia with the patient or authorized representative who has indicated his/her understanding and acceptance.   Dental advisory given  Plan Discussed with: CRNA and Anesthesiologist  Anesthesia Plan Comments:         Anesthesia Quick Evaluation

## 2017-05-18 NOTE — H&P (Signed)
Raymond [name] is an 61 y.o. male.   Chief Complaint: LEFT ULNAR SHAFT FRACTURE  HPI: Mr. Raymond [name] is a 61 y/o right hand dominant male who is on dialysis and injured his left forearm after a fall onto the arm on 03/29/17. He was seen in the emergency department for initial treatment where he was put into a long arm splint.  He was seen in the office for further evaluation. He was put into a long arm splint and we discussed the reason and rationale for surgery. We attempted to schedule surgery but the patient did not answer his phone and did not return any voicemails. He presented back in the office on 05/10/17 for re-evaluation. Discussed the reason and rationale for surgery and the use of a plate and screws to realign the bone.  Discussed the surgical procedure, including the risks versus benefits, and the post-operative recovery.  The patient is here today for surgery.       Past Medical History:  Diagnosis Date  . Anemia   . ESRD (end stage renal disease) on dialysis [facility])    "MWF; Jeneen Rinks" (12/05/2016)  . Hepatitis C    Still positive s/p liver biopsy at [facility]  and interferon therapy for 6 months. Most recent lab work was on 10/24/12  . Hepatitis C    "took the tx; gone now" (12/05/2016)  . History of blood transfusion ~ 2012/2013   "related to my kidneys; blood was low"  . Hypertension   . Substance abuse ([place])   . Thyroid disease          Past Surgical History:  Procedure Laterality Date  . AV FISTULA PLACEMENT Left Aug. 2013 ?  [name] LIVER BIOPSY    . SHUNTOGRAM N/A 12/11/2012   Procedure: Earney Mallet;  Surgeon: Serafina Mitchell, MD;  Location: [facility] CATH LAB;  Service: Cardiovascular;  Laterality: N/A;         Family History  Problem Relation Age of Onset  . Diabetes Father   . Hypertension Father    Social History:  reports that he quit smoking about 5 years ago. His smoking use included Cigarettes. He has a 25.00 pack-year smoking history. He has never used  smokeless tobacco. He reports that he drinks about 4.8 oz of alcohol per week . He reports that he uses drugs, including Cocaine and Marijuana.  Allergies: No Known Allergies  No prescriptions prior to admission.    LabResultsLast48Hours  No results found for this or any previous visit (from the past 48 hour(s)).   ImagingResults(Last48hours)  No results found.    ROS  NO RECENT ILLNESSES OR HOSPITALIZATIONS  There were no vitals taken for this visit. Physical Exam  General Appearance:  Alert, cooperative, no distress, appears stated age  Head:  Normocephalic, without obvious abnormality, atraumatic  Eyes:  Pupils equal, conjunctiva/corneas clear,         Throat: Lips, mucosa, and tongue normal; teeth and gums normal  Neck: No visible masses     Lungs:   respirations unlabored  Chest Wall:  No tenderness or deformity  Heart:  Regular rate and rhythm,  Abdomen:   Soft, non-tender,         Extremities: LUE: TENDERNESS AT THE MIDSHAFT OF THE ULNA WITH MILD SWELLING. NO ECCHYMOSIS, ERYTHEMA, OR OPEN WOUNDS. FISTULA PRESENT ON THE RIGHT FOREARM. SENSATION INTACT TO LIGHT TOUCH. CAPILLARY REFILL LESS THAN 2 SECONDS. ABLE TO MAKE A FULL FIST, CROSS FINGERS, AND ABDUCT THUMB.  Pulses: 2+ and  symmetric  Skin: Skin color, texture, turgor normal, no rashes or lesions     Neurologic: Normal    Assessment LEFT ULNAR SHAFT FRACTURE  Plan LEFT ULNAR SHAFT OPEN REDUCTION AND INTERNAL FIXATION  R/B/A DISCUSSED WITH PT IN OFFICE.  PT VOICED UNDERSTANDING OF PLAN CONSENT SIGNED DAY OF SURGERY PT SEEN AND EXAMINED PRIOR TO OPERATIVE PROCEDURE/DAY OF SURGERY SITE MARKED. QUESTIONS ANSWERED WILL GO HOME FOLLOWING SURGERY  WE ARE PLANNING SURGERY FOR YOUR UPPER EXTREMITY. THE RISKS AND BENEFITS OF SURGERY INCLUDE BUT NOT LIMITED TO BLEEDING INFECTION, DAMAGE TO NEARBY NERVES ARTERIES TENDONS, FAILURE OF SURGERY TO ACCOMPLISH ITS INTENDED GOALS, PERSISTENT  SYMPTOMS AND NEED FOR FURTHER SURGICAL INTERVENTION. WITH THIS IN MIND WE WILL PROCEED. I HAVE DISCUSSED WITH THE PATIENT THE PRE AND POSTOPERATIVE REGIMEN AND THE DOS AND DON'TS. PT VOICED UNDERSTANDING AND INFORMED CONSENT SIGNED.

## 2017-05-18 NOTE — Transfer of Care (Signed)
Immediate Anesthesia Transfer of Care Note  Patient: Raymond [name]  Procedure(s) Performed: OPEN REDUCTION INTERNAL FIXATION (ORIF) ULNAR FRACTURE (Left )  Patient Location: PACU  Anesthesia Type:General  Level of Consciousness: awake, alert , oriented and patient cooperative  Airway & Oxygen Therapy: Patient Spontanous Breathing  Post-op Assessment: Report given to RN, Post -op Vital signs reviewed and stable and Patient moving all extremities X 4  Post vital signs: Reviewed and stable  Last Vitals:  Vitals:   05/18/17 1451  BP: 122/71  Pulse: 83  Resp: 18  Temp: 37 C  SpO2: 100%    Last Pain:  Vitals:   05/18/17 1451  TempSrc: Oral         Complications: No apparent anesthesia complications

## 2017-05-18 NOTE — Discharge Instructions (Signed)
KEEP BANDAGE CLEAN AND DRY Do not remove the bandage directly over the incision. Okay to remove the white wrap so that the arm could be accessed for dialysis Try to keep the arm in the sling when he is not at dialysis. Do not remove the bandage that is adherent to the skin over the incision. Do not get the incision wet CALL OFFICE FOR F/U APPT [phone] in 14 days KEEP HAND ELEVATED ABOVE HEART OK TO APPLY ICE TO OPERATIVE AREA CONTACT OFFICE IF ANY WORSENING PAIN OR CONCERNS.

## 2017-05-18 NOTE — Anesthesia Postprocedure Evaluation (Signed)
Anesthesia Post Note  Patient: Allan E Belasco  Procedure(s) Performed: OPEN REDUCTION INTERNAL FIXATION (ORIF) ULNAR FRACTURE (Left )     Patient location during evaluation: PACU Anesthesia Type: General Level of consciousness: awake and alert Pain management: pain level controlled Vital Signs Assessment: post-procedure vital signs reviewed and stable Respiratory status: spontaneous breathing, nonlabored ventilation and respiratory function stable Cardiovascular status: blood pressure returned to baseline and stable Postop Assessment: no apparent nausea or vomiting Anesthetic complications: no    Last Vitals:  Vitals:   05/18/17 1845 05/18/17 1900  BP: (!) 144/96 (!) 149/96  Pulse: 80 76  Resp: 12 15  Temp:  36.6 C  SpO2: 94% 97%    Last Pain:  Vitals:   05/18/17 1915  TempSrc:   PainSc: 3                  Kaysee Hergert,W. EDMOND

## 2017-05-18 NOTE — Op Note (Signed)
PREOPERATIVE DIAGNOSIS: Left ulnar shaft malunion  POSTOPERATIVE DIAGNOSIS: Same  ATTENDING SURGEON: Dr. Gavin Pound who was scrubbed and present for the entire procedure  ASSISTANT SURGEON: None  ANESTHESIA: Gen. via LMA  OPERATIVE PROCEDURE: #1: Open treatment of left ulnar shaft fracture malunion requiring compression plating technique #2: Radiographs 3 views left forearm  IMPLANTS: [employer] 8 hole 3.5 DCP plate  RADIOGRAPHIC INTERPRETATION: AP lateral oblique views of the forearm do show the plate fixation place in good position  SURGICAL INDICATIONS: Mr. Meuser a right-hand-dominant gentleman who sustained a closed fracture to his left forearm. Patient was followed in the office and the patient was developing the and developed the malunion. Patient elected undergo the above procedure. Risks benefits and alternatives were discussed in detail with the patient in a signed informed consent was obtained. Risks include but not limited to bleeding infection damage to nearby nerves arteries or tendons loss of motion of the wrists and digits incomplete relief of symptoms and need for further surgical intervention.  SURGICAL TECHNIQUE: Patient is probably identified in the preoperative holding area and a mark with a permanent marker made on the left performed indicate correct operative site. Patient and brought back to operating room placed supine on anesthesia and table general anesthesia was administered. Patient tolerated this well. A well-padded tourniquet was then placed on the left brachium and sealed with the appropriate drape. Left upper extremity was then prepped and draped in normal sterile fashion. A timeout was called the correct site was identified and the procedure was then begun. Attention was then turned to the left ulnar shaft. A longitudinal incision made directly over the ulnar shaft. Dissection was then carried down through the skin and subcutaneous tissue. The malunion was  then identified and takedown of the malunion was then done with curettes Rogers and osteotomes. This freed up both the proximal and distal fragments. After takedown of the malunion and open reduction was then performed and held in place with a reduction clamp. This was confirmed using the mini C-arm. After adequate reduction the 8 hole plate was then applied and held in place with the appropriate clamps. After this is carried out screw fixation was then carried out with a combination of locking and nonlocking 35 bicortical screws. Total of 8 screws were then used. The wound was then thoroughly irrigated. Final radiographs were then obtained. The fascial layer was closed with 2-0 Vicryl. Subcutaneous tissues closed with 3-0 Vicryl and the skin closed a running 20 horizontal mattress Prolene suture. 10 mL accord percent Marcaine infiltrated locally. Aqua cell dressing was then applied. The patient tolerated the procedure well was placed in a sling taken recovery room in good condition. The tourniquet was not insufflated during the entire time  POSTOPERATIVE PLAN: The patient is to be discharged to home: Seen back in the office and proxy 2 weeks for wound check suture removal. He must keep the aqua cell dressing on at all times. They can access the fistula at all times from the radial side the do not remove the aqua cell dressing. Continue with the sling for comfort. We left him out of the splint so that he can be dialyzed in that arm. Radiographs of the forearm at each visit.

## 2017-05-18 NOTE — Anesthesia Procedure Notes (Signed)
Procedure Name: LMA Insertion Date/Time: 05/18/2017 4:52 PM Performed by: Julieta Bellini, CRNA Pre-anesthesia Checklist: Patient identified, Emergency Drugs available, Suction available and Patient being monitored Patient Re-evaluated:Patient Re-evaluated prior to induction Oxygen Delivery Method: Circle system utilized Preoxygenation: Pre-oxygenation with 100% oxygen Induction Type: IV induction Ventilation: Mask ventilation without difficulty LMA: LMA inserted LMA Size: 4.0 Tube type: Oral Number of attempts: 1 Placement Confirmation: positive ETCO2 and breath sounds checked- equal and bilateral Tube secured with: Tape Dental Injury: Teeth and Oropharynx as per pre-operative assessment

## 2017-05-19 ENCOUNTER — Emergency Department (HOSPITAL_COMMUNITY)
Admission: EM | Admit: 2017-05-19 | Discharge: 2017-05-19 | Disposition: A | Discharge status: Home / Self Care | Payer: Medicare Other | Attending: Emergency Medicine | Admitting: Emergency Medicine

## 2017-05-19 ENCOUNTER — Other Ambulatory Visit: Payer: Self-pay

## 2017-05-19 ENCOUNTER — Encounter (HOSPITAL_COMMUNITY): Payer: Self-pay | Admitting: Orthopedic Surgery

## 2017-05-19 DIAGNOSIS — N2581 Secondary hyperparathyroidism of renal origin: Secondary | ICD-10-CM | POA: Diagnosis not present

## 2017-05-19 DIAGNOSIS — I120 Hypertensive chronic kidney disease with stage 5 chronic kidney disease or end stage renal disease: Secondary | ICD-10-CM | POA: Insufficient documentation

## 2017-05-19 DIAGNOSIS — Z992 Dependence on renal dialysis: Secondary | ICD-10-CM | POA: Insufficient documentation

## 2017-05-19 DIAGNOSIS — D509 Iron deficiency anemia, unspecified: Secondary | ICD-10-CM | POA: Diagnosis not present

## 2017-05-19 DIAGNOSIS — E7800 Pure hypercholesterolemia, unspecified: Secondary | ICD-10-CM | POA: Diagnosis not present

## 2017-05-19 DIAGNOSIS — D631 Anemia in chronic kidney disease: Secondary | ICD-10-CM | POA: Diagnosis not present

## 2017-05-19 DIAGNOSIS — N186 End stage renal disease: Secondary | ICD-10-CM | POA: Insufficient documentation

## 2017-05-19 DIAGNOSIS — Z79899 Other long term (current) drug therapy: Secondary | ICD-10-CM | POA: Diagnosis not present

## 2017-05-19 DIAGNOSIS — J449 Chronic obstructive pulmonary disease, unspecified: Secondary | ICD-10-CM | POA: Diagnosis not present

## 2017-05-19 DIAGNOSIS — G8918 Other acute postprocedural pain: Secondary | ICD-10-CM | POA: Insufficient documentation

## 2017-05-19 DIAGNOSIS — Z4801 Encounter for change or removal of surgical wound dressing: Secondary | ICD-10-CM | POA: Diagnosis not present

## 2017-05-19 DIAGNOSIS — Z87891 Personal history of nicotine dependence: Secondary | ICD-10-CM | POA: Diagnosis not present

## 2017-05-19 DIAGNOSIS — Z5189 Encounter for other specified aftercare: Secondary | ICD-10-CM

## 2017-05-19 LAB — BASIC METABOLIC PANEL
Anion gap: 12 (ref 5–15)
BUN: 25 mg/dL — ABNORMAL HIGH (ref 6–20)
CALCIUM: 8.7 mg/dL — AB (ref 8.9–10.3)
CO2: 30 mmol/L (ref 22–32)
CREATININE: 7.81 mg/dL — AB (ref 0.61–1.24)
Chloride: 96 mmol/L — ABNORMAL LOW (ref 101–111)
GFR calc non Af Amer: 7 mL/min — ABNORMAL LOW (ref >60)
GFR, EST AFRICAN AMERICAN: 8 mL/min — AB (ref >60)
GLUCOSE: 93 mg/dL (ref 65–99)
Potassium: 3.8 mmol/L (ref 3.5–5.1)
Sodium: 138 mmol/L (ref 135–145)

## 2017-05-19 LAB — CBC WITH DIFFERENTIAL/PLATELET
BASOS PCT: 0 %
Basophils Absolute: 0 10*3/uL (ref 0.0–0.1)
Eosinophils Absolute: 0 10*3/uL (ref 0.0–0.7)
Eosinophils Relative: 1 %
HEMATOCRIT: 34.5 % — AB (ref 39.0–52.0)
Hemoglobin: 10.8 g/dL — ABNORMAL LOW (ref 13.0–17.0)
Lymphocytes Relative: 29 %
Lymphs Abs: 1 10*3/uL (ref 0.7–4.0)
MCH: 33.9 pg (ref 26.0–34.0)
MCHC: 31.3 g/dL (ref 30.0–36.0)
MCV: 108.2 fL — AB (ref 78.0–100.0)
MONO ABS: 0.4 10*3/uL (ref 0.1–1.0)
MONOS PCT: 11 %
NEUTROS ABS: 1.9 10*3/uL (ref 1.7–7.7)
Neutrophils Relative %: 59 %
Platelets: 122 10*3/uL — ABNORMAL LOW (ref 150–400)
RBC: 3.19 MIL/uL — ABNORMAL LOW (ref 4.22–5.81)
RDW: 15.4 % (ref 11.5–15.5)
WBC: 3.3 10*3/uL — ABNORMAL LOW (ref 4.0–10.5)

## 2017-05-19 LAB — APTT: aPTT: 28 seconds (ref 24–36)

## 2017-05-19 LAB — PROTIME-INR
INR: 1.06
PROTHROMBIN TIME: 13.7 s (ref 11.4–15.2)

## 2017-05-19 MED ORDER — SODIUM CHLORIDE 0.9 % IV SOLN
INTRAVENOUS | Status: DC
  Administered 2017-05-19: 10 mL/h via INTRAVENOUS

## 2017-05-19 MED ORDER — OXYCODONE-ACETAMINOPHEN 5-325 MG PO TABS
2.0000 | ORAL_TABLET | Freq: Once | ORAL | Status: AC
  Administered 2017-05-19: 2 via ORAL
  Filled 2017-05-19: qty 2

## 2017-05-19 NOTE — ED Notes (Signed)
Bleeding controlled on left arm

## 2017-05-19 NOTE — Discharge Instructions (Signed)
Return here for numbness or tingling in your hand, severe pain, or any other problems.  Follow-up with your hand surgeon as directed

## 2017-05-19 NOTE — Progress Notes (Signed)
Orthopedic Tech Progress Note Patient Details:  KOLIN ERDAHL 10-05-1955 [id number]  Ortho Devices Type of Ortho Device: Arm sling, Post (long arm) splint Ortho Device/Splint Location: applied long arm splint to pt left arm with middle/bend of arm exposed.  pt tolerated application well.  applied arm sling for support.  Ortho Device/Splint Interventions: Application, Adjustment   Kristopher Oppenheim 05/19/2017, 10:51 PM

## 2017-05-19 NOTE — Consult Note (Signed)
Asked to see patient for post operative arm swelling and pain, some oozing.  Pt had ORIF of  Left Ulna yesterday Dr. Caralyn Guile.  Today during dialysis with harm in the dependant position, arm started to hurt, some oozing from incision.  Pt later presented to ER for evaluation.  Pt's PMH, PSH, Meds, ALL, Soc Hx reviewed Vitals stable BP 172/98  Left upper extremity: moderate swelling as would be expected from ORIF; able to flex/extend wrist and fingers, no active bleeding currently, suture line closed, large fistula present; gross sensation to fingers intact. I spoke with Dr. Caralyn Guile about pt and his care.  A/P s/p ORIF L Ulna - doubt compartment syndrome Will redress incision with Aquacel, apply splint, encouraged pt to elevate, ice, and take pain medication appropriately.  Pt should f/u with Dr. Caralyn Guile.

## 2017-05-19 NOTE — ED Provider Notes (Signed)
[facility] EMERGENCY DEPARTMENT Provider Note   CSN: [id number] Arrival date & time: 05/19/17  1801     History   Chief Complaint Chief Complaint  Patient presents with  . Post-op Problem    HPI Raymond [name] is a 61 y.o. male.  61 year old male presents with bleeding from his left forearm that began spontaneously this evening.  Patient had surgery on that forearm yesterday from a prior fracture.  He is also hemodialysis patient who was unable to tolerate 2 hours of dialysis due to increasing pain.  He denied having any bleeding during his session.  There is no history of trauma.  His arm was elevated when the current symptoms started.  Does not take any blood thinners.  Patient states he does have some numbness in his fingers and trouble closing a fist due to the pain.  No treatment used prior to arrival      Past Medical History:  Diagnosis Date  . Anemia   . Dyspnea    "with too much fluid"  . ESRD (end stage renal disease) on dialysis [facility])    "MWF; [facility]" (12/05/2016)  . Hepatitis C    Still positive s/p liver biopsy at [facility]  and interferon therapy for 6 months. Most recent lab work was on 10/24/12  . Hepatitis C    "took the tx; gone now" (12/05/2016)  Was treated  . History of blood transfusion ~ 2012/2013   "related to my kidneys; blood was low"  . Hypertension   . Substance abuse ([place])   . Thyroid disease     Patient Active Problem List   Diagnosis Date Noted  . Acute bronchitis   . COPD exacerbation ([place])   . End-stage renal disease on hemodialysis ([place])   . Hypoxia 12/05/2016  . Hyperkalemia 12/19/2015  . Hypoglycemia 12/19/2015  . Polysubstance abuse ([place]) 12/19/2015  . Homelessness 12/19/2015  . Anemia associated with stage 5 chronic renal failure ([place]) 12/19/2015  . Chest pain 12/19/2013  . Pain in limb 12/04/2012  . End stage renal disease ([place]) 12/04/2012  . HEPATITIS C 09/07/2006  . HYPERCHOLESTEROLEMIA 09/07/2006  .  HYPERTENSION, BENIGN SYSTEMIC 09/07/2006    Past Surgical History:  Procedure Laterality Date  . AV FISTULA PLACEMENT Left Aug. 2013 ?  [name] LIVER BIOPSY         Home Medications    Prior to Admission medications   Medication Sig Start Date End Date Taking? Authorizing Provider  albuterol (PROVENTIL HFA;VENTOLIN HFA) 108 (90 Base) MCG/ACT inhaler Inhale 2 puffs into the lungs every 6 (six) hours as needed for wheezing or shortness of breath. 12/06/16   Lorella Nimrod, MD  amLODipine (NORVASC) 5 MG tablet Take 5 mg by mouth daily.    [provider]  calcitRIOL (ROCALTROL) 0.25 MCG capsule Take 7 capsules (1.75 mcg total) by mouth every Monday, Wednesday, and Friday with hemodialysis. 12/07/16   Lorella Nimrod, MD  cephALEXin (KEFLEX) 500 MG capsule Take 1 capsule (500 mg total) 4 (four) times daily for 10 days by mouth. 05/18/17 05/28/17  Iran Planas, MD  cinacalcet (SENSIPAR) 30 MG tablet Take 1 tablet (30 mg total) by mouth daily with supper. 12/21/15   Arrien, Jimmy Picket, MD  cyclobenzaprine (FLEXERIL) 5 MG tablet Take 1 tablet (5 mg total) by mouth 3 (three) times daily as needed (muscle soreness). 04/09/17   Rolland Porter, MD  docusate sodium (COLACE) 100 MG capsule Take 1 capsule (100 mg total) 2 (two) times  daily by mouth. 05/18/17   Iran Planas, MD  folic acid (FOLVITE) 1 MG tablet Take 1 tablet (1 mg total) by mouth daily. Patient taking differently: Take 1 mg by mouth 3 (three) times a week.  12/07/16   Lorella Nimrod, MD  gabapentin (NEURONTIN) 300 MG capsule Take 300 mg by mouth at bedtime as needed (sleep).    [provider]  metoprolol succinate (TOPROL-XL) 25 MG 24 hr tablet Take 2 tablets (50 mg total) by mouth daily. Patient taking differently: Take 25 mg 2 (two) times daily by mouth.  12/21/15   Arrien, Jimmy Picket, MD  Multiple Vitamin (MULTIVITAMIN WITH MINERALS) TABS tablet Take 1 tablet by mouth daily. 12/07/16   Lorella Nimrod, MD  naproxen (NAPROSYN)  250 MG tablet Take 1 tablet (250 mg total) by mouth 2 (two) times daily. 04/09/17   Rolland Porter, MD  omeprazole (PRILOSEC) 20 MG capsule Take 1 capsule (20 mg total) by mouth daily. 04/29/17   Khatri, Hina, PA-C  oxyCODONE (OXY IR/ROXICODONE) 5 MG immediate release tablet Take 1 tablet (5 mg total) by mouth every 6 (six) hours as needed for severe pain. 03/29/17   Ward, Ozella Almond, PA-C  oxyCODONE-acetaminophen (ROXICET) 5-325 MG tablet Take 1 tablet 3 (three) times daily for 10 days by mouth. 05/18/17 05/28/17  Iran Planas, MD  sevelamer (RENAGEL) 800 MG tablet Take 800 mg by mouth 3 (three) times daily with meals.    [provider]  thiamine (VITAMIN B-1) 100 MG tablet Take 100 mg by mouth 3 (three) times a week.     [provider]  traMADol (ULTRAM) 50 MG tablet Take 1 tablet (50 mg total) by mouth every 6 (six) hours as needed. 78/29/56   Delora Fuel, MD    Family History Family History  Problem Relation Age of Onset  . Diabetes Father   . Hypertension Father     Social History Social History   Tobacco Use  . Smoking status: Former Smoker    Packs/day: 1.00    Years: 25.00    Pack years: 25.00    Types: Cigarettes    Last attempt to quit: 08/01/2011    Years since quitting: 5.8  . Smokeless tobacco: Never Used  Substance Use Topics  . Alcohol use: Yes    Alcohol/week: 1.2 oz    Types: 2 Glasses of wine per week  . Drug use: Yes    Types: Cocaine    Comment: 05/17/17- cocaine 2 -3 weeks ago- "Now I go to Alcohol and Drug  classes- thinking for a change""     Allergies   Patient has no known allergies.   Review of Systems Review of Systems  All other systems reviewed and are negative.    Physical Exam Updated Vital Signs BP (!) 161/117 (BP Location: Right Arm)   Pulse 87   Temp 98.3 F (36.8 C) (Oral)   SpO2 96%   Physical Exam  Constitutional: He is oriented to person, place, and time. He appears well-developed and well-nourished.   Non-toxic appearance. No distress.  HENT:  Head: Normocephalic and atraumatic.  Eyes: Conjunctivae, EOM and lids are normal. Pupils are equal, round, and reactive to light.  Neck: Normal range of motion. Neck supple. No tracheal deviation present. No thyroid mass present.  Cardiovascular: Normal rate, regular rhythm and normal heart sounds. Exam reveals no gallop.  No murmur heard. Pulmonary/Chest: Effort normal and breath sounds normal. No stridor. No respiratory distress. He has no decreased breath sounds.  He has no wheezes. He has no rhonchi. He has no rales.  Abdominal: Soft. Normal appearance and bowel sounds are normal. He exhibits no distension. There is no tenderness. There is no rebound and no CVA tenderness.  Musculoskeletal: Normal range of motion. He exhibits no edema or tenderness.       Arms: Neurological: He is alert and oriented to person, place, and time. He has normal strength. No cranial nerve deficit or sensory deficit. GCS eye subscore is 4. GCS verbal subscore is 5. GCS motor subscore is 6.  Skin: Skin is warm and dry. No abrasion and no rash noted.  Psychiatric: He has a normal mood and affect. His speech is normal and behavior is normal.  Nursing note and vitals reviewed.    ED Treatments / Results  Labs (all labs ordered are listed, but only abnormal results are displayed) Labs Reviewed  CBC WITH DIFFERENTIAL/PLATELET  BASIC METABOLIC PANEL    EKG  EKG Interpretation None       Radiology No results found.  Procedures Procedures (including critical care time)  Medications Ordered in ED Medications  0.9 %  sodium chloride infusion (not administered)  oxyCODONE-acetaminophen (PERCOCET/ROXICET) 5-325 MG per tablet 2 tablet (not administered)     Initial Impression / Assessment and Plan / ED Course  I have reviewed the triage vital signs and the nursing notes.  Pertinent labs & imaging results that were available during my care of the patient were  reviewed by me and considered in my medical decision making (see chart for details).     Patient treated with Percocet for pain here.  Although patient only had 2 hours of dialysis his potassium is 3.8.  Patient seen by Dr. Lenon Curt from hand surgery and feels that the patient does not have a compartment syndrome.  He has treated his wound and has arranged follow-up.  Recommends discharge at this time  Final Clinical Impressions(s) / ED Diagnoses   Final diagnoses:  None    ED Discharge Orders    None       Lacretia Leigh, MD 05/19/17 2119

## 2017-05-19 NOTE — ED Triage Notes (Signed)
Pt presents to ED for assessment after breaking his arm last week.  Pt has surgery on his left forearm for the break, but he also received dialysis through his left forearm.  Pt went to dialysis today and treatment was so painful he was unable to finish his treatment.  Pt then noted blood coming from his surgical area in his left arm.  Blood noted to be pooled up below his surgical dressing.

## 2017-05-22 DIAGNOSIS — D509 Iron deficiency anemia, unspecified: Secondary | ICD-10-CM | POA: Diagnosis not present

## 2017-05-22 DIAGNOSIS — D631 Anemia in chronic kidney disease: Secondary | ICD-10-CM | POA: Diagnosis not present

## 2017-05-22 DIAGNOSIS — N2581 Secondary hyperparathyroidism of renal origin: Secondary | ICD-10-CM | POA: Diagnosis not present

## 2017-05-22 DIAGNOSIS — N186 End stage renal disease: Secondary | ICD-10-CM | POA: Diagnosis not present

## 2017-05-26 DIAGNOSIS — D631 Anemia in chronic kidney disease: Secondary | ICD-10-CM | POA: Diagnosis not present

## 2017-05-26 DIAGNOSIS — N186 End stage renal disease: Secondary | ICD-10-CM | POA: Diagnosis not present

## 2017-05-26 DIAGNOSIS — N2581 Secondary hyperparathyroidism of renal origin: Secondary | ICD-10-CM | POA: Diagnosis not present

## 2017-05-26 DIAGNOSIS — D509 Iron deficiency anemia, unspecified: Secondary | ICD-10-CM | POA: Diagnosis not present

## 2017-05-30 DIAGNOSIS — N186 End stage renal disease: Secondary | ICD-10-CM | POA: Diagnosis not present

## 2017-05-30 DIAGNOSIS — N2581 Secondary hyperparathyroidism of renal origin: Secondary | ICD-10-CM | POA: Diagnosis not present

## 2017-05-30 DIAGNOSIS — D631 Anemia in chronic kidney disease: Secondary | ICD-10-CM | POA: Diagnosis not present

## 2017-05-30 DIAGNOSIS — D509 Iron deficiency anemia, unspecified: Secondary | ICD-10-CM | POA: Diagnosis not present

## 2017-05-31 ENCOUNTER — Emergency Department (HOSPITAL_COMMUNITY): Payer: Medicare Other

## 2017-05-31 ENCOUNTER — Emergency Department (HOSPITAL_COMMUNITY)
Admission: EM | Admit: 2017-05-31 | Discharge: 2017-06-01 | Disposition: A | Discharge status: Home / Self Care | Payer: Medicare Other | Attending: Emergency Medicine | Admitting: Emergency Medicine

## 2017-05-31 ENCOUNTER — Encounter (HOSPITAL_COMMUNITY): Payer: Self-pay

## 2017-05-31 DIAGNOSIS — F10920 Alcohol use, unspecified with intoxication, uncomplicated: Secondary | ICD-10-CM | POA: Insufficient documentation

## 2017-05-31 DIAGNOSIS — Z79899 Other long term (current) drug therapy: Secondary | ICD-10-CM | POA: Diagnosis not present

## 2017-05-31 DIAGNOSIS — M79632 Pain in left forearm: Secondary | ICD-10-CM | POA: Diagnosis not present

## 2017-05-31 DIAGNOSIS — Z87891 Personal history of nicotine dependence: Secondary | ICD-10-CM | POA: Insufficient documentation

## 2017-05-31 DIAGNOSIS — Z992 Dependence on renal dialysis: Secondary | ICD-10-CM | POA: Insufficient documentation

## 2017-05-31 DIAGNOSIS — M79602 Pain in left arm: Secondary | ICD-10-CM | POA: Diagnosis present

## 2017-05-31 DIAGNOSIS — I120 Hypertensive chronic kidney disease with stage 5 chronic kidney disease or end stage renal disease: Secondary | ICD-10-CM | POA: Diagnosis not present

## 2017-05-31 DIAGNOSIS — N186 End stage renal disease: Secondary | ICD-10-CM | POA: Diagnosis not present

## 2017-05-31 DIAGNOSIS — J449 Chronic obstructive pulmonary disease, unspecified: Secondary | ICD-10-CM | POA: Insufficient documentation

## 2017-05-31 DIAGNOSIS — T148XXA Other injury of unspecified body region, initial encounter: Secondary | ICD-10-CM | POA: Diagnosis not present

## 2017-05-31 DIAGNOSIS — M79603 Pain in arm, unspecified: Secondary | ICD-10-CM | POA: Diagnosis not present

## 2017-05-31 LAB — I-STAT CHEM 8, ED
BUN: 33 mg/dL — ABNORMAL HIGH (ref 6–20)
Calcium, Ion: 0.93 mmol/L — ABNORMAL LOW (ref 1.15–1.40)
Chloride: 97 mmol/L — ABNORMAL LOW (ref 101–111)
Creatinine, Ser: 12.3 mg/dL — ABNORMAL HIGH (ref 0.61–1.24)
Glucose, Bld: 77 mg/dL (ref 65–99)
HEMATOCRIT: 33 % — AB (ref 39.0–52.0)
HEMOGLOBIN: 11.2 g/dL — AB (ref 13.0–17.0)
POTASSIUM: 4.3 mmol/L (ref 3.5–5.1)
Sodium: 136 mmol/L (ref 135–145)
TCO2: 25 mmol/L (ref 22–32)

## 2017-05-31 LAB — ETHANOL: ALCOHOL ETHYL (B): 293 mg/dL — AB (ref <10)

## 2017-05-31 IMAGING — DX DG FOREARM 2V*L*
2 series · 2 of 2 positions shown · non-contrast
Comparison: [DATE]

CLINICAL DATA: Generalized left forearm pain.

EXAM:
LEFT FOREARM - 2 VIEW

[x forearm ap left]
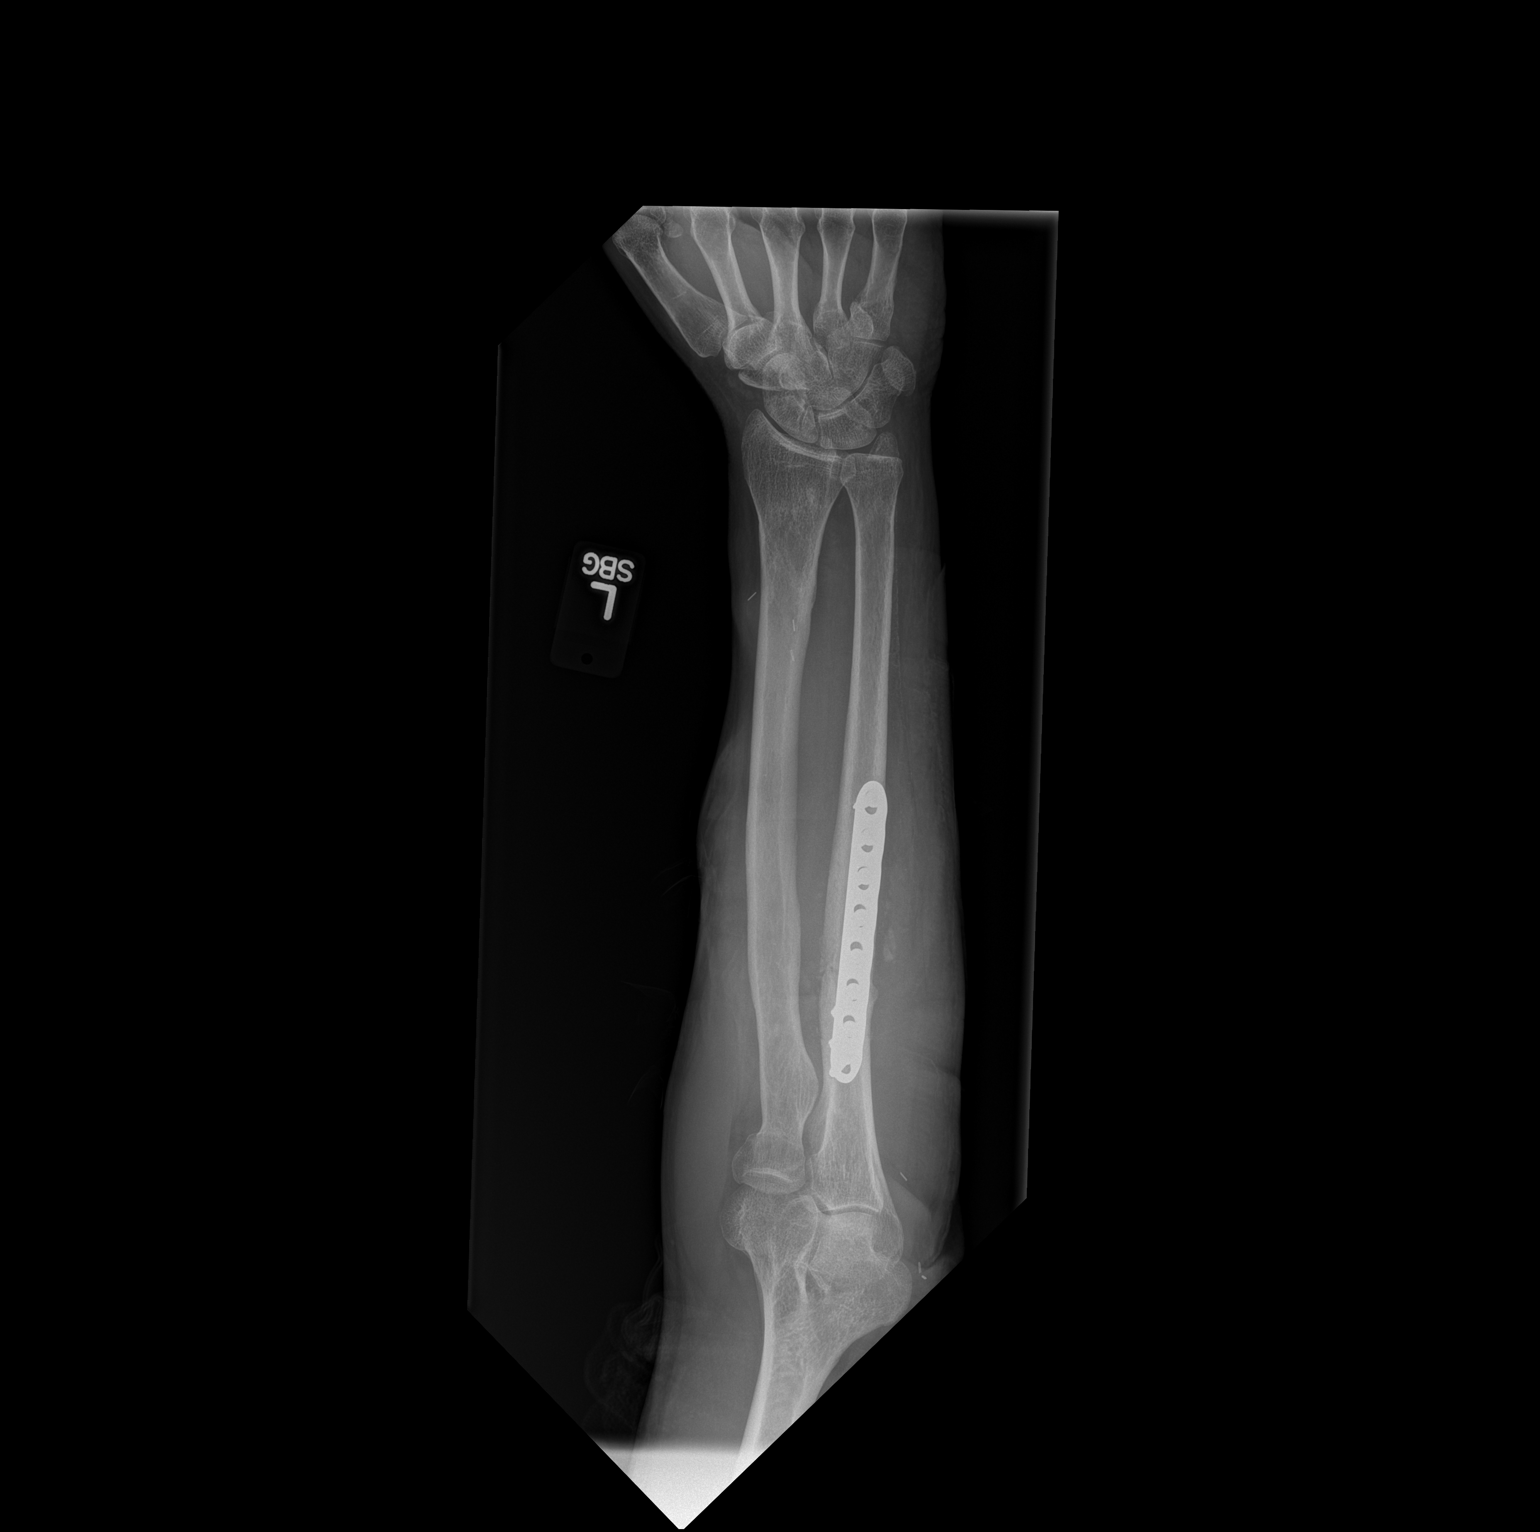

[x forearm lat left]
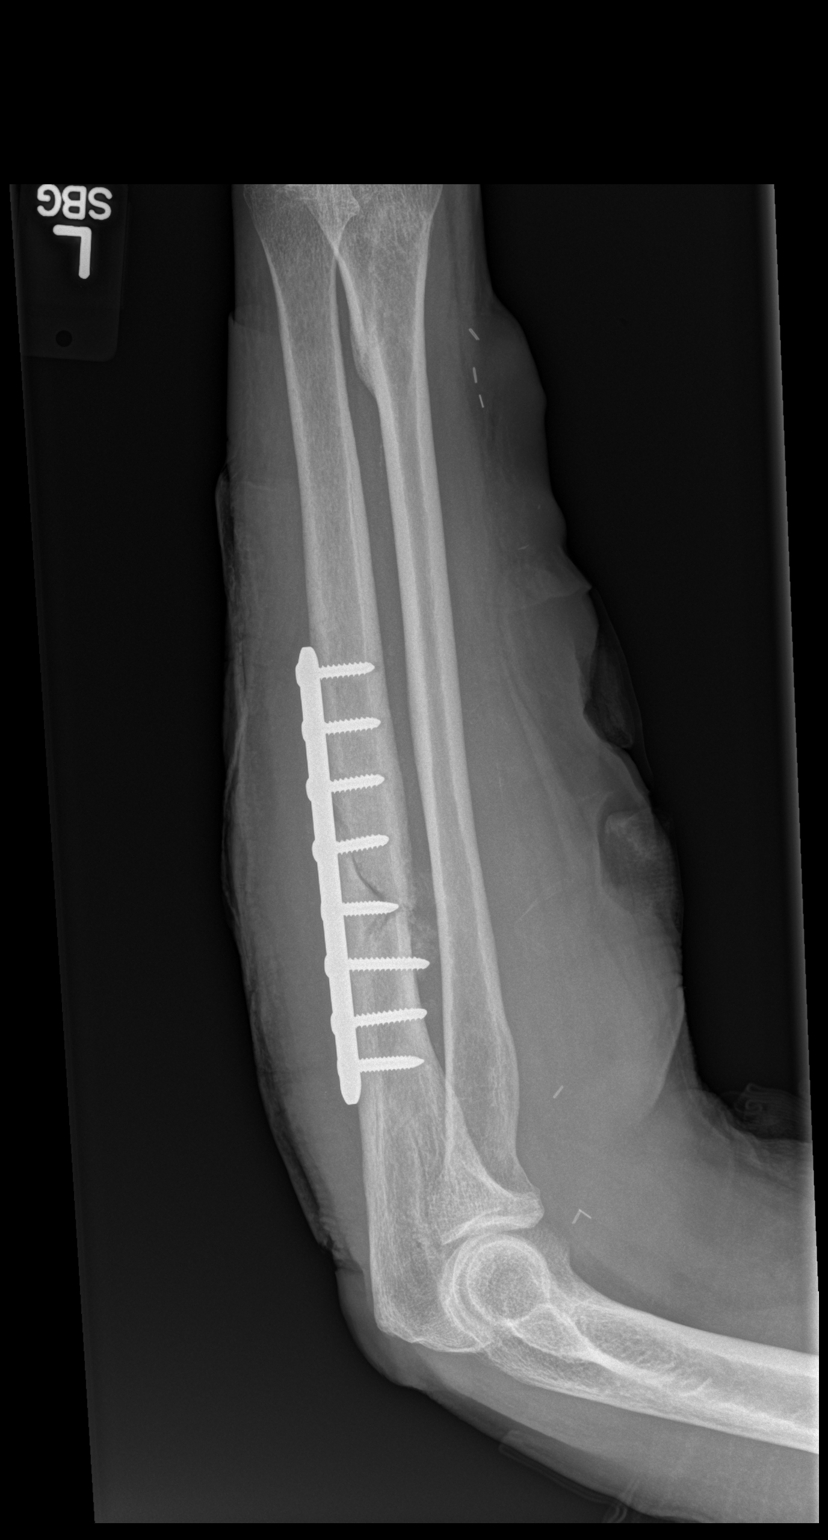

[2 of 2 positions shown; findings below may reference images not displayed]

FINDINGS: There is a plate and screw fixation of an ulnar diaphysis fracture.
There is callus around the fracture consistent with healing. The
hardware is in unremarkable position. Mild lucency about the tips of
distal screws is nonspecific and could be from drilling as there is
no generalized lucency about the screws. Remote distal radius
fracture that is healed. Stable distortion of the radial head/ neck
that is likely from old fracture. No elbow joint effusion.
Osteopenia. Vascular surgery.
IMPRESSION: 1. Healing ulnar diaphysis fracture.
2. Progressive osteopenia.
3. Remote distal radius and radial head/neck fractures.

## 2017-05-31 NOTE — ED Notes (Signed)
Pt aware urine sample is needed 

## 2017-05-31 NOTE — ED Notes (Signed)
Patient transported to X-ray 

## 2017-05-31 NOTE — ED Provider Notes (Signed)
[facility] EMERGENCY DEPARTMENT Provider Note   CSN: [id number] Arrival date & time: 05/31/17  1959    History   Chief Complaint Chief Complaint  Patient presents with  . Arm Pain    HPI Raymond [name] is a 61 y.o. male.  61 year old male with a history of anemia, ESRD on M/W/F dialysis, hepatitis C, HTN, and substance abuse presents to the emergency department via EMS.  During my assessment with the patient, he does not seem to recall how he got to the ED.  He states he "called a friend".  I notify the patient that he was brought in by ambulance.  This seemed to make him confused.  Triage note references complaint of left arm pain in the setting of recent surgery.  Patient did have ORIF of the L ulnar shaft by Dr. Caralyn Guile on 05/18/17.  He reportedly told nursing he was out of his pain medication.  No reported falls or trauma.  Patient appears intoxicated.  Level 5 caveat applies.   The history is provided by the patient. No language interpreter was used.  Arm Pain     Past Medical History:  Diagnosis Date  . Anemia   . Dyspnea    "with too much fluid"  . ESRD (end stage renal disease) on dialysis [facility])    "MWF; Jeneen Rinks" (12/05/2016)  . Hepatitis C    Still positive s/p liver biopsy at [facility]  and interferon therapy for 6 months. Most recent lab work was on 10/24/12  . Hepatitis C    "took the tx; gone now" (12/05/2016)  Was treated  . History of blood transfusion ~ 2012/2013   "related to my kidneys; blood was low"  . Hypertension   . Substance abuse ([place])   . Thyroid disease     Patient Active Problem List   Diagnosis Date Noted  . Acute bronchitis   . COPD exacerbation ([place])   . End-stage renal disease on hemodialysis ([place])   . Hypoxia 12/05/2016  . Hyperkalemia 12/19/2015  . Hypoglycemia 12/19/2015  . Polysubstance abuse ([place]) 12/19/2015  . Homelessness 12/19/2015  . Anemia associated with stage 5 chronic renal failure ([place]) 12/19/2015    . Chest pain 12/19/2013  . Pain in limb 12/04/2012  . End stage renal disease ([place]) 12/04/2012  . HEPATITIS C 09/07/2006  . HYPERCHOLESTEROLEMIA 09/07/2006  . HYPERTENSION, BENIGN SYSTEMIC 09/07/2006    Past Surgical History:  Procedure Laterality Date  . AV FISTULA PLACEMENT Left Aug. 2013 ?  [name] LIVER BIOPSY    . ORIF ULNAR FRACTURE Left 05/18/2017   Procedure: OPEN REDUCTION INTERNAL FIXATION (ORIF) ULNAR FRACTURE;  Surgeon: Iran Planas, MD;  Location: [facility];  Service: Orthopedics;  Laterality: Left;  . SHUNTOGRAM N/A 12/11/2012   Procedure: Earney Mallet;  Surgeon: Serafina Mitchell, MD;  Location: [facility] CATH LAB;  Service: Cardiovascular;  Laterality: N/A;       Home Medications    Prior to Admission medications   Medication Sig Start Date End Date Taking? Authorizing Provider  albuterol (PROVENTIL HFA;VENTOLIN HFA) 108 (90 Base) MCG/ACT inhaler Inhale 2 puffs into the lungs every 6 (six) hours as needed for wheezing or shortness of breath. 12/06/16   Lorella Nimrod, MD  amLODipine (NORVASC) 5 MG tablet Take 5 mg by mouth daily.    [provider]  calcitRIOL (ROCALTROL) 0.25 MCG capsule Take 7 capsules (1.75 mcg total) by mouth every Monday, Wednesday, and Friday with hemodialysis. 12/07/16   Lorella Nimrod, MD  cinacalcet (SENSIPAR) 30 MG tablet Take 1 tablet (30 mg total) by mouth daily with supper. 12/21/15   Arrien, Jimmy Picket, MD  cyclobenzaprine (FLEXERIL) 5 MG tablet Take 1 tablet (5 mg total) by mouth 3 (three) times daily as needed (muscle soreness). 04/09/17   Rolland Porter, MD  docusate sodium (COLACE) 100 MG capsule Take 1 capsule (100 mg total) 2 (two) times daily by mouth. 05/18/17   Iran Planas, MD  folic acid (FOLVITE) 1 MG tablet Take 1 tablet (1 mg total) by mouth daily. 12/07/16   Lorella Nimrod, MD  gabapentin (NEURONTIN) 300 MG capsule Take 300 mg by mouth at bedtime as needed (sleep).    [provider]  metoprolol succinate (TOPROL-XL) 25 MG 24 hr  tablet Take 2 tablets (50 mg total) by mouth daily. 12/21/15   Arrien, Jimmy Picket, MD  Multiple Vitamin (MULTIVITAMIN WITH MINERALS) TABS tablet Take 1 tablet by mouth daily. 12/07/16   Lorella Nimrod, MD  naproxen (NAPROSYN) 250 MG tablet Take 1 tablet (250 mg total) by mouth 2 (two) times daily. 04/09/17   Rolland Porter, MD  omeprazole (PRILOSEC) 20 MG capsule Take 1 capsule (20 mg total) by mouth daily. 04/29/17   Khatri, Hina, PA-C  oxyCODONE (OXY IR/ROXICODONE) 5 MG immediate release tablet Take 1 tablet (5 mg total) by mouth every 6 (six) hours as needed for severe pain. 03/29/17   Ward, Ozella Almond, PA-C  sevelamer (RENAGEL) 800 MG tablet Take 800 mg by mouth 3 (three) times daily with meals.    [provider]  thiamine (VITAMIN B-1) 100 MG tablet Take 100 mg by mouth 3 (three) times a week.     [provider]  traMADol (ULTRAM) 50 MG tablet Take 1 tablet (50 mg total) by mouth every 6 (six) hours as needed. 91/63/84   Delora Fuel, MD    Family History Family History  Problem Relation Age of Onset  . Diabetes Father   . Hypertension Father     Social History Social History   Tobacco Use  . Smoking status: Former Smoker    Packs/day: 1.00    Years: 25.00    Pack years: 25.00    Types: Cigarettes    Last attempt to quit: 08/01/2011    Years since quitting: 5.8  . Smokeless tobacco: Never Used  Substance Use Topics  . Alcohol use: Yes    Alcohol/week: 1.2 oz    Types: 2 Glasses of wine per week  . Drug use: Yes    Types: Cocaine    Comment: 05/17/17- cocaine 2 -3 weeks ago- "Now I go to Alcohol and Drug  classes- thinking for a change""     Allergies   Patient has no known allergies.   Review of Systems Review of Systems  Unable to perform ROS: Mental status change  Patient intoxicated   Physical Exam Updated Vital Signs BP 140/86   Pulse 68   Temp 98.3 F (36.8 C)   Resp 18   Ht 5\' 8"  (1.727 m)   Wt 72.6 kg (160 lb)   SpO2 98%   BMI  24.33 kg/m   Physical Exam  Constitutional: He is oriented to person, place, and time. He appears well-developed and well-nourished. No distress.  Nontoxic; appears intoxicated.  HENT:  Head: Normocephalic and atraumatic.  Eyes: Conjunctivae and EOM are normal. No scleral icterus.  19mm pupils bilaterally  Neck: Normal range of motion.  Cardiovascular: Normal rate, regular rhythm and intact distal pulses.  Distal  radial pulse intact. Palpable thrill over LUE fistula.  Pulmonary/Chest: Effort normal. No stridor. No respiratory distress.  Respirations even and unlabored  Musculoskeletal: Normal range of motion.  Normal ROM of the LUE. Bandages noted over prior surgical site. No appreciable deformity or crepitus.  Neurological: He is alert and oriented to person, place, and time. He exhibits normal muscle tone. Coordination normal.  Skin: Skin is warm and dry. No rash noted. He is not diaphoretic. No erythema. No pallor.  Psychiatric: His speech is slurred. He is slowed.  Nursing note and vitals reviewed.    ED Treatments / Results  Labs (all labs ordered are listed, but only abnormal results are displayed) Labs Reviewed  ETHANOL - Abnormal; Notable for the following components:      Result Value   Alcohol, Ethyl (B) 293 (*)    All other components within normal limits  I-STAT CHEM 8, ED - Abnormal; Notable for the following components:   Chloride 97 (*)    BUN 33 (*)    Creatinine, Ser 12.30 (*)    Calcium, Ion 0.93 (*)    Hemoglobin 11.2 (*)    HCT 33.0 (*)    All other components within normal limits    EKG  EKG Interpretation None       Radiology Dg Forearm Left  Result Date: 05/31/2017 CLINICAL DATA:  Generalized left forearm pain. EXAM: LEFT FOREARM - 2 VIEW COMPARISON:  03/29/2017 FINDINGS: There is a plate and screw fixation of an ulnar diaphysis fracture. There is callus around the fracture consistent with healing. The hardware is in unremarkable position.  Mild lucency about the tips of distal screws is nonspecific and could be from drilling as there is no generalized lucency about the screws. Remote distal radius fracture that is healed. Stable distortion of the radial head/ neck that is likely from old fracture. No elbow joint effusion. Osteopenia. Vascular surgery. IMPRESSION: 1. Healing ulnar diaphysis fracture. 2. Progressive osteopenia. 3. Remote distal radius and radial head/neck fractures. Electronically Signed   By: Monte Fantasia M.D.   On: 05/31/2017 21:03    Procedures Procedures (including critical care time)  Medications Ordered in ED Medications - No data to display   6:16 AM Patient ambulatory with steady gait in the ED hallway. He has been in no visible or audible discomfort since arrival.   Initial Impression / Assessment and Plan / ED Course  I have reviewed the triage vital signs and the nursing notes.  Pertinent labs & imaging results that were available during my care of the patient were reviewed by me and considered in my medical decision making (see chart for details).     61 year old male presents to the emergency department by EMS.  EMS reports complaints of left arm pain.  He had a recent surgery by Dr. Caralyn Guile.  Patient neurovascularly intact.  He has no reproducible discomfort.  He is not complaining of pain during my assessment.  He is visibly intoxicated.  Patient has been able to sober in the emergency department for multiple hours.  He is now ambulatory without.  Speech is clear.  Vital signs have been stable.  I do not believe further emergent workup is indicated at this time.  The patient has been instructed to follow-up with Dr. Caralyn Guile for recheck regarding his persistent pain. Return precautions provided. Patient discharged in stable condition.  Vitals:   06/01/17 0015 06/01/17 0045 06/01/17 0130 06/01/17 0512  BP: (!) 147/94 (!) 147/95 124/89 140/86  Pulse:  74 68 73 68  Resp:    18  Temp:    98.3 F  (36.8 C)  TempSrc:      SpO2: 100% 100% 98% 98%  Weight:      Height:        Final Clinical Impressions(s) / ED Diagnoses   Final diagnoses:  Alcoholic intoxication without complication ([place])  Left arm pain    ED Discharge Orders    None       Antonietta Breach, PA-C 06/01/17 2536    Margette Fast, MD 06/01/17 [phone]

## 2017-05-31 NOTE — ED Triage Notes (Signed)
Pt arrived via [facility] c/o left arm pain, pt report left arm surgery last week.  ETOH on board.  Left arm shunt M/W/F dialysis.

## 2017-06-01 NOTE — ED Notes (Signed)
Pt able to ambulate on the hallway with steady gait.

## 2017-06-01 NOTE — Discharge Instructions (Signed)
We recommend Tylenol as needed for pain.  Follow-up with Dr. Caralyn Guile given your recent surgery.  Avoid alcohol in excess.

## 2017-06-05 DIAGNOSIS — N186 End stage renal disease: Secondary | ICD-10-CM | POA: Diagnosis not present

## 2017-06-05 DIAGNOSIS — N2581 Secondary hyperparathyroidism of renal origin: Secondary | ICD-10-CM | POA: Diagnosis not present

## 2017-06-05 DIAGNOSIS — D631 Anemia in chronic kidney disease: Secondary | ICD-10-CM | POA: Diagnosis not present

## 2017-06-05 DIAGNOSIS — D509 Iron deficiency anemia, unspecified: Secondary | ICD-10-CM | POA: Diagnosis not present

## 2017-06-07 DIAGNOSIS — D509 Iron deficiency anemia, unspecified: Secondary | ICD-10-CM | POA: Diagnosis not present

## 2017-06-07 DIAGNOSIS — N186 End stage renal disease: Secondary | ICD-10-CM | POA: Diagnosis not present

## 2017-06-07 DIAGNOSIS — D631 Anemia in chronic kidney disease: Secondary | ICD-10-CM | POA: Diagnosis not present

## 2017-06-07 DIAGNOSIS — N2581 Secondary hyperparathyroidism of renal origin: Secondary | ICD-10-CM | POA: Diagnosis not present

## 2017-06-09 DIAGNOSIS — N2889 Other specified disorders of kidney and ureter: Secondary | ICD-10-CM | POA: Diagnosis not present

## 2017-06-09 DIAGNOSIS — N186 End stage renal disease: Secondary | ICD-10-CM | POA: Diagnosis not present

## 2017-06-09 DIAGNOSIS — Z992 Dependence on renal dialysis: Secondary | ICD-10-CM | POA: Diagnosis not present

## 2017-06-11 ENCOUNTER — Emergency Department (HOSPITAL_COMMUNITY): Payer: Medicare Other

## 2017-06-11 ENCOUNTER — Other Ambulatory Visit: Payer: Self-pay

## 2017-06-11 ENCOUNTER — Emergency Department (HOSPITAL_COMMUNITY)
Admission: EM | Admit: 2017-06-11 | Discharge: 2017-06-12 | Disposition: A | Discharge status: Home / Self Care | Payer: Medicare Other | Attending: Emergency Medicine | Admitting: Emergency Medicine

## 2017-06-11 ENCOUNTER — Encounter (HOSPITAL_COMMUNITY): Payer: Self-pay | Admitting: Emergency Medicine

## 2017-06-11 DIAGNOSIS — Z992 Dependence on renal dialysis: Secondary | ICD-10-CM | POA: Diagnosis not present

## 2017-06-11 DIAGNOSIS — J441 Chronic obstructive pulmonary disease with (acute) exacerbation: Secondary | ICD-10-CM

## 2017-06-11 DIAGNOSIS — I120 Hypertensive chronic kidney disease with stage 5 chronic kidney disease or end stage renal disease: Secondary | ICD-10-CM | POA: Insufficient documentation

## 2017-06-11 DIAGNOSIS — N186 End stage renal disease: Secondary | ICD-10-CM | POA: Insufficient documentation

## 2017-06-11 DIAGNOSIS — R079 Chest pain, unspecified: Secondary | ICD-10-CM | POA: Diagnosis not present

## 2017-06-11 DIAGNOSIS — Z79899 Other long term (current) drug therapy: Secondary | ICD-10-CM | POA: Diagnosis not present

## 2017-06-11 DIAGNOSIS — I700 Atherosclerosis of aorta: Secondary | ICD-10-CM | POA: Diagnosis not present

## 2017-06-11 DIAGNOSIS — Z87891 Personal history of nicotine dependence: Secondary | ICD-10-CM | POA: Diagnosis not present

## 2017-06-11 DIAGNOSIS — R0602 Shortness of breath: Secondary | ICD-10-CM | POA: Diagnosis not present

## 2017-06-11 LAB — CBC WITH DIFFERENTIAL/PLATELET
BASOS ABS: 0 10*3/uL (ref 0.0–0.1)
BASOS PCT: 0 %
Eosinophils Absolute: 0.1 10*3/uL (ref 0.0–0.7)
Eosinophils Relative: 1 %
HEMATOCRIT: 25.6 % — AB (ref 39.0–52.0)
Hemoglobin: 8 g/dL — ABNORMAL LOW (ref 13.0–17.0)
Lymphocytes Relative: 51 %
Lymphs Abs: 2.3 10*3/uL (ref 0.7–4.0)
MCH: 32.3 pg (ref 26.0–34.0)
MCHC: 31.3 g/dL (ref 30.0–36.0)
MCV: 103.2 fL — ABNORMAL HIGH (ref 78.0–100.0)
MONO ABS: 0.5 10*3/uL (ref 0.1–1.0)
Monocytes Relative: 12 %
NEUTROS ABS: 1.6 10*3/uL — AB (ref 1.7–7.7)
NEUTROS PCT: 36 %
Platelets: 156 10*3/uL (ref 150–400)
RBC: 2.48 MIL/uL — ABNORMAL LOW (ref 4.22–5.81)
RDW: 16.2 % — AB (ref 11.5–15.5)
WBC: 4.5 10*3/uL (ref 4.0–10.5)

## 2017-06-11 LAB — I-STAT CHEM 8, ED
BUN: 75 mg/dL — AB (ref 6–20)
CHLORIDE: 101 mmol/L (ref 101–111)
CREATININE: 16.9 mg/dL — AB (ref 0.61–1.24)
Calcium, Ion: 0.88 mmol/L — CL (ref 1.15–1.40)
GLUCOSE: 98 mg/dL (ref 65–99)
HCT: 27 % — ABNORMAL LOW (ref 39.0–52.0)
HEMOGLOBIN: 9.2 g/dL — AB (ref 13.0–17.0)
POTASSIUM: 4.7 mmol/L (ref 3.5–5.1)
Sodium: 144 mmol/L (ref 135–145)
TCO2: 26 mmol/L (ref 22–32)

## 2017-06-11 LAB — I-STAT TROPONIN, ED: Troponin i, poc: 0 ng/mL (ref 0.00–0.08)

## 2017-06-11 IMAGING — DX DG CHEST 2V
2 series · 2 of 2 positions shown · non-contrast
Comparison: [DATE] and prior chest radiographs

CLINICAL DATA: Acute shortness of breath and chest congestion
today.

EXAM:
CHEST  2 VIEW

[chest lat]
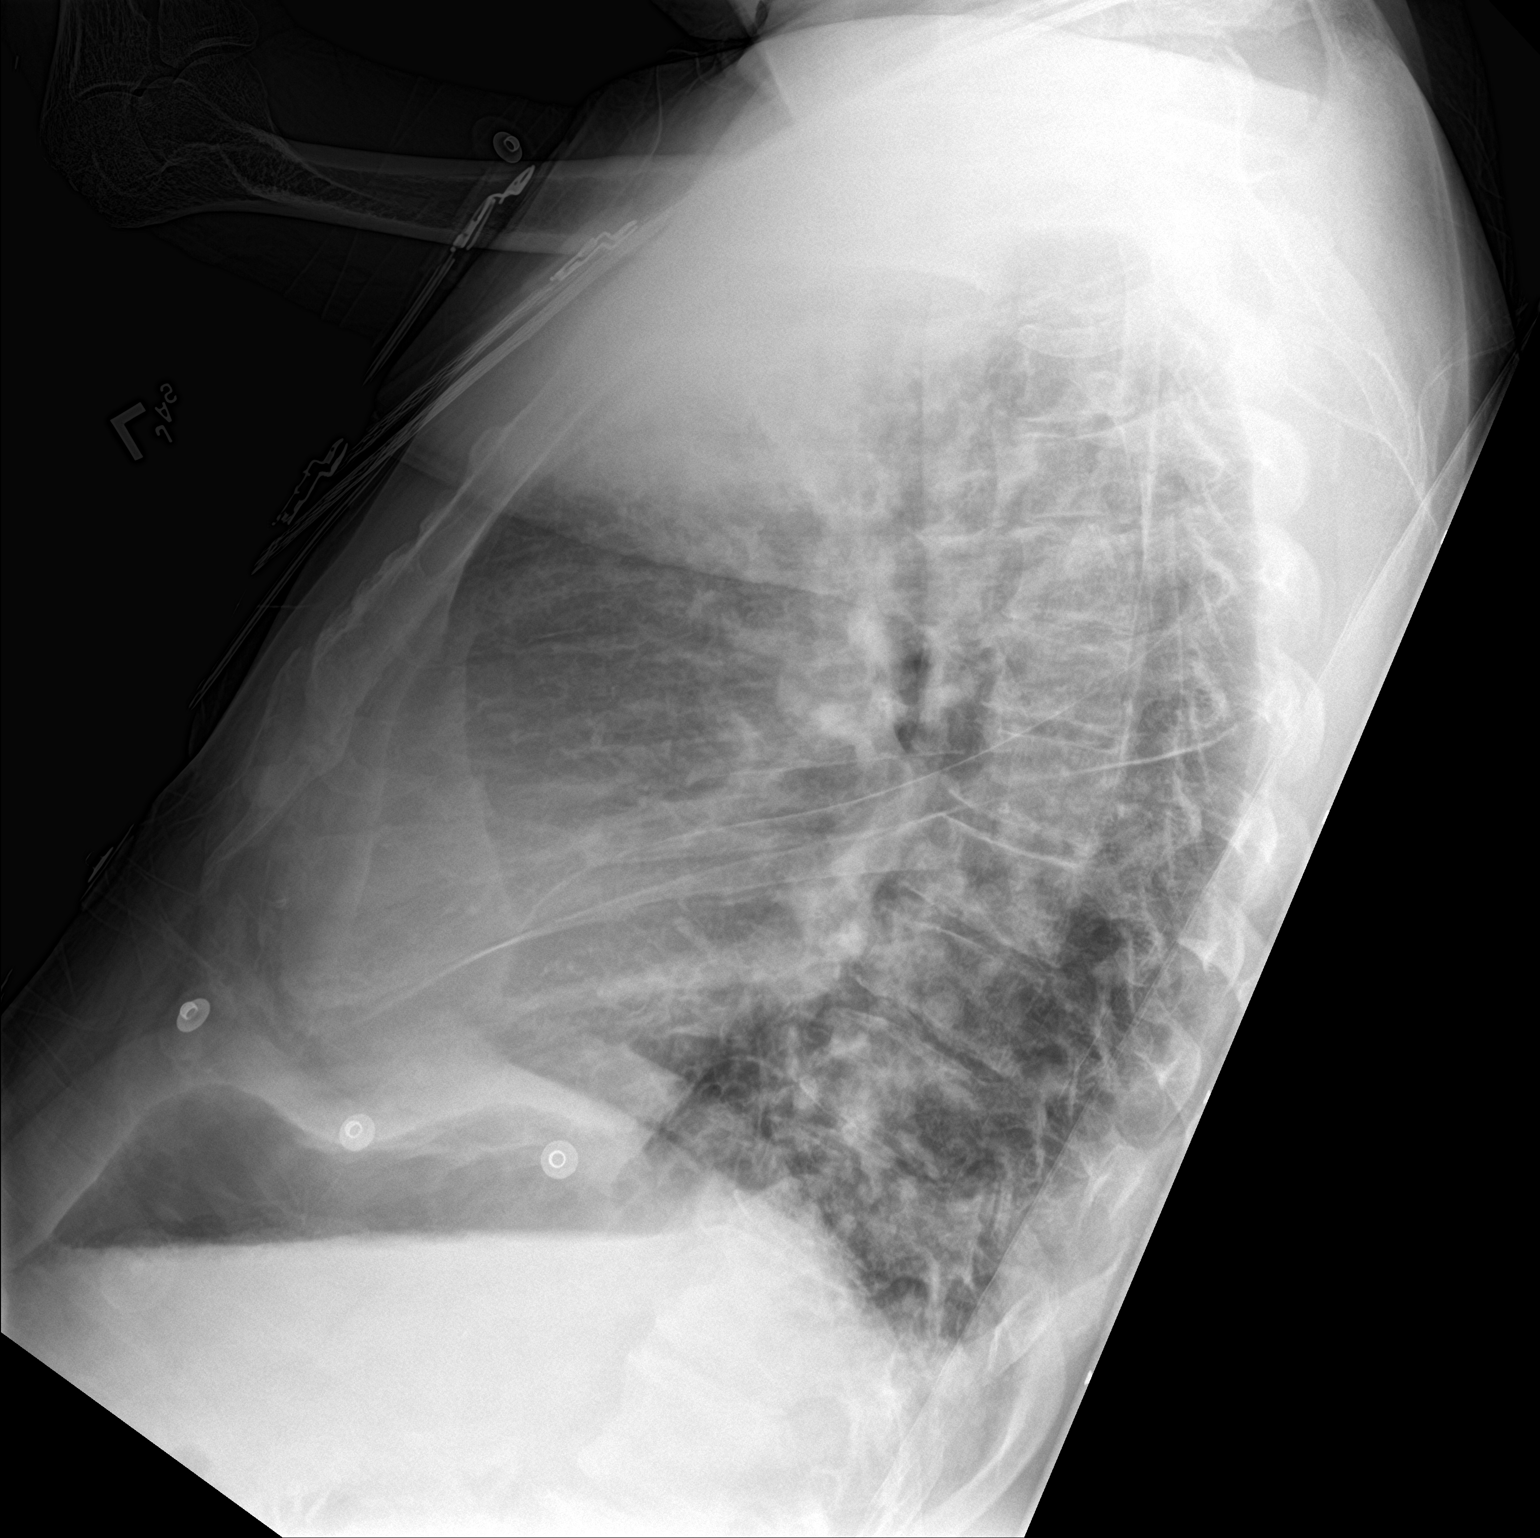

[chest ap]
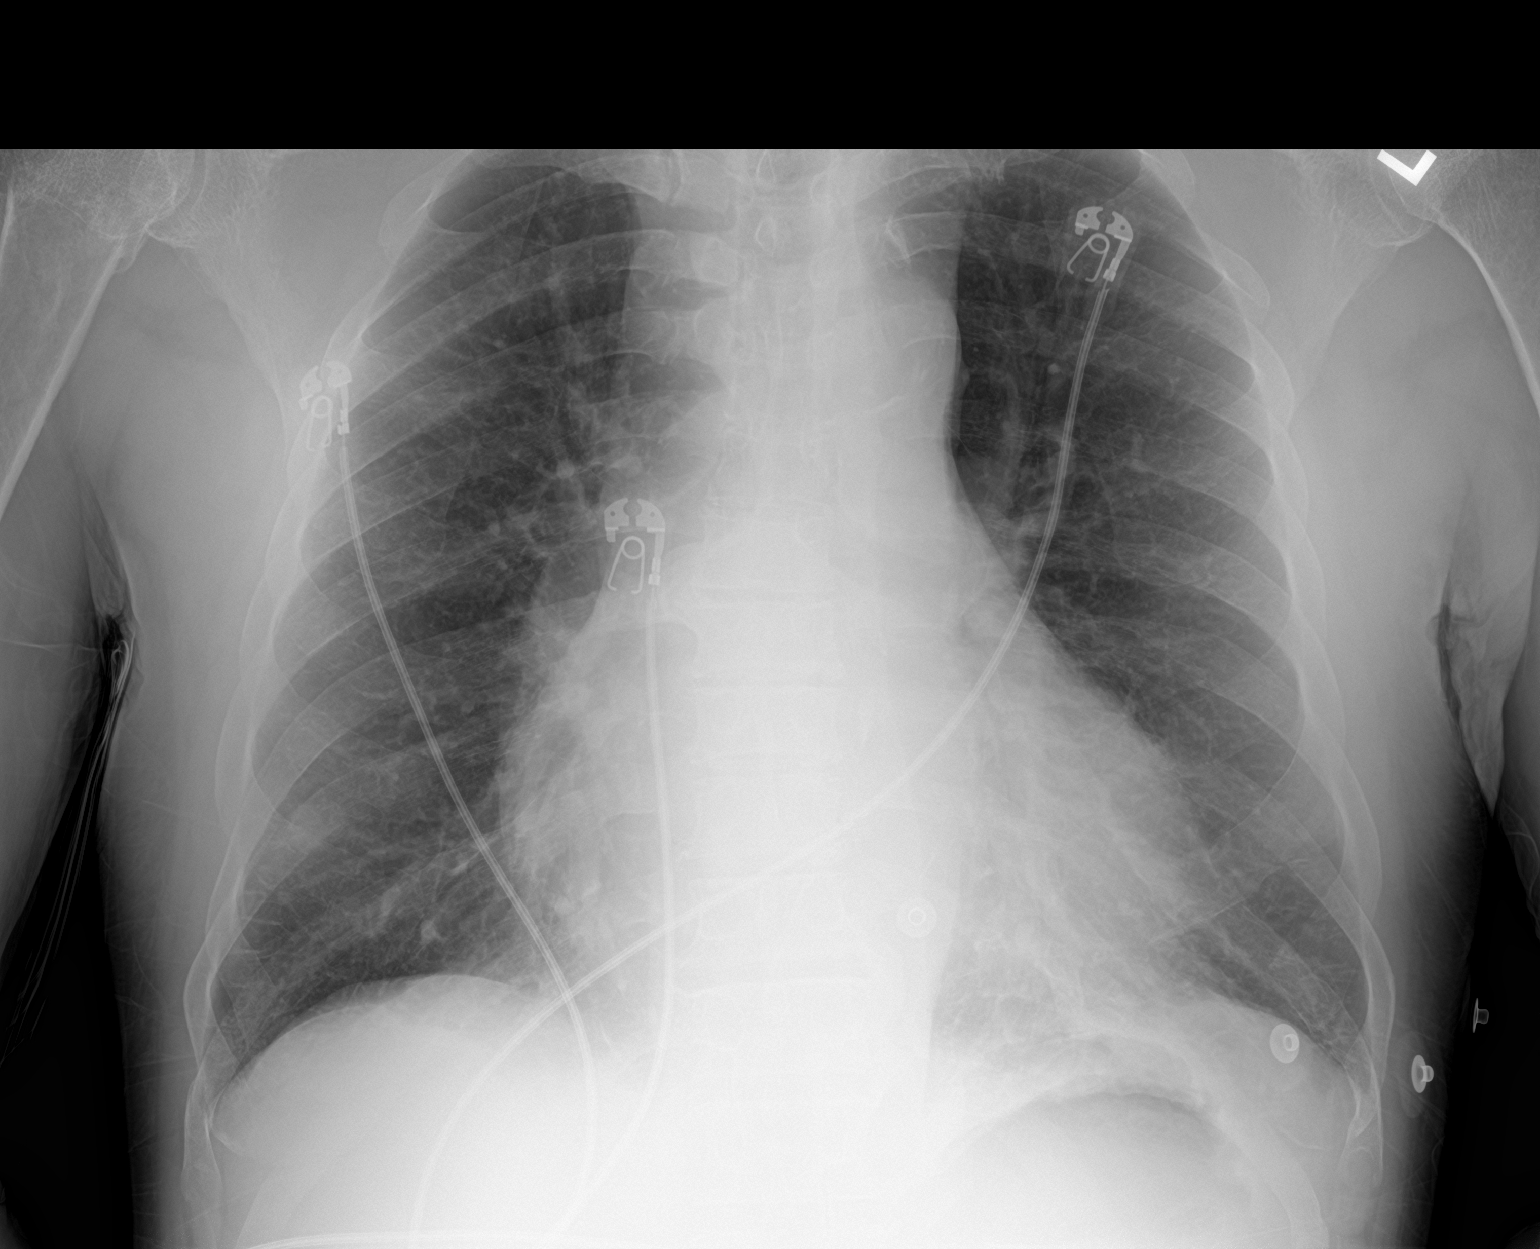

[2 of 2 positions shown; findings below may reference images not displayed]

FINDINGS: Cardiomegaly and very mild interstitial opacities noted compatible
with minimal interstitial edema.

There is no evidence of airspace disease or pneumothorax.

A trace amount digital fluid is noted.

No acute bony abnormality is identified.
IMPRESSION: Cardiomegaly with minimal interstitial pulmonary edema.

## 2017-06-11 NOTE — ED Provider Notes (Signed)
[facility] EMERGENCY DEPARTMENT Provider Note   CSN: [id number] Arrival date & time: 06/11/17  2324     History   Chief Complaint Chief Complaint  Patient presents with  . Shortness of Breath  . Missed Hemodialysis    HPI Raymond [name] is a 61 y.o. male.  Patient is a 61 year old male with multiple medical problems including end-stage renal disease on dialysis, hepatitis, COPD, substance abuse who is presenting today with shortness of breath.  Patient states that he missed dialysis on Friday but had seemed to be okay until today when he developed shortness of breath.  Initially it was just shortness of breath with exertion and he thought he could wait for dialysis till the morning but tonight when he laid down to go to sleep he felt like he was suffocating and could not catch his breath.  He denies fever, cough, nausea, vomiting or abdominal pain.  He is complaining of generalized chest tightness.  He has some mild swelling in his lower legs but states that that is pretty normal.  He does not check his weight other than when he is at dialysis.  Patient does not use breathing treatments at home regularly by EMS administered 2 nitroglycerin because his blood pressure was greater than 992 systolic, Solu-Medrol and albuterol nebulizer.  Patient states that shortness of breath is improved but not resolved.   The history is provided by the patient.  Shortness of Breath  This is a new problem. The average episode lasts 1 day. The problem occurs continuously.The current episode started 12 to 24 hours ago. The problem has been rapidly worsening. Associated symptoms include wheezing, chest pain and leg swelling. Pertinent negatives include no fever, no headaches, no rhinorrhea, no cough, no sputum production, no vomiting, no abdominal pain and no leg pain. Precipitated by: missed HD on friday. He has had prior hospitalizations. He has had prior ED visits. Associated medical issues  comments: hep C, ESRD, HTN, COPD, substance abuse.    Past Medical History:  Diagnosis Date  . Anemia   . Dyspnea    "with too much fluid"  . ESRD (end stage renal disease) on dialysis [facility])    "MWF; [facility] R[facility]" (12/05/2016)  . Hepatitis C    Still positive s/p liver biopsy at [facility]  and interferon therapy for 6 months. Most recent lab work was on 10/24/12  . Hepatitis C    "took the tx; gone now" (12/05/2016)  Was treated  . History of blood transfusion ~ 2012/2013   "related to my kidneys; blood was low"  . Hypertension   . Substance abuse ([place])   . Thyroid disease     Patient Active Problem List   Diagnosis Date Noted  . Acute bronchitis   . COPD exacerbation ([place])   . End-stage renal disease on hemodialysis ([place])   . Hypoxia 12/05/2016  . Hyperkalemia 12/19/2015  . Hypoglycemia 12/19/2015  . Polysubstance abuse ([place]) 12/19/2015  . Homelessness 12/19/2015  . Anemia associated with stage 5 chronic renal failure ([place]) 12/19/2015  . Chest pain 12/19/2013  . Pain in limb 12/04/2012  . End stage renal disease ([place]) 12/04/2012  . HEPATITIS C 09/07/2006  . HYPERCHOLESTEROLEMIA 09/07/2006  . HYPERTENSION, BENIGN SYSTEMIC 09/07/2006    Past Surgical History:  Procedure Laterality Date  . AV FISTULA PLACEMENT Left Aug. 2013 ?  [name] LIVER BIOPSY    . ORIF ULNAR FRACTURE Left 05/18/2017   Procedure: OPEN REDUCTION INTERNAL FIXATION (ORIF) ULNAR FRACTURE;  Surgeon: Iran Planas, MD;  Location: [facility];  Service: Orthopedics;  Laterality: Left;  . SHUNTOGRAM N/A 12/11/2012   Procedure: Earney Mallet;  Surgeon: Serafina Mitchell, MD;  Location: [facility] CATH LAB;  Service: Cardiovascular;  Laterality: N/A;       Home Medications    Prior to Admission medications   Medication Sig Start Date End Date Taking? Authorizing Provider  albuterol (PROVENTIL HFA;VENTOLIN HFA) 108 (90 Base) MCG/ACT inhaler Inhale 2 puffs into the lungs every 6 (six) hours as needed for wheezing or shortness of breath.  12/06/16   Lorella Nimrod, MD  amLODipine (NORVASC) 5 MG tablet Take 5 mg by mouth daily.    [provider]  calcitRIOL (ROCALTROL) 0.25 MCG capsule Take 7 capsules (1.75 mcg total) by mouth every Monday, Wednesday, and Friday with hemodialysis. 12/07/16   Lorella Nimrod, MD  cinacalcet (SENSIPAR) 30 MG tablet Take 1 tablet (30 mg total) by mouth daily with supper. 12/21/15   Arrien, Jimmy Picket, MD  cyclobenzaprine (FLEXERIL) 5 MG tablet Take 1 tablet (5 mg total) by mouth 3 (three) times daily as needed (muscle soreness). 04/09/17   Rolland Porter, MD  docusate sodium (COLACE) 100 MG capsule Take 1 capsule (100 mg total) 2 (two) times daily by mouth. 05/18/17   Iran Planas, MD  folic acid (FOLVITE) 1 MG tablet Take 1 tablet (1 mg total) by mouth daily. 12/07/16   Lorella Nimrod, MD  gabapentin (NEURONTIN) 300 MG capsule Take 300 mg by mouth at bedtime as needed (sleep).    [provider]  metoprolol succinate (TOPROL-XL) 25 MG 24 hr tablet Take 2 tablets (50 mg total) by mouth daily. 12/21/15   Arrien, Jimmy Picket, MD  Multiple Vitamin (MULTIVITAMIN WITH MINERALS) TABS tablet Take 1 tablet by mouth daily. 12/07/16   Lorella Nimrod, MD  naproxen (NAPROSYN) 250 MG tablet Take 1 tablet (250 mg total) by mouth 2 (two) times daily. 04/09/17   Rolland Porter, MD  omeprazole (PRILOSEC) 20 MG capsule Take 1 capsule (20 mg total) by mouth daily. 04/29/17   Khatri, Hina, PA-C  oxyCODONE (OXY IR/ROXICODONE) 5 MG immediate release tablet Take 1 tablet (5 mg total) by mouth every 6 (six) hours as needed for severe pain. 03/29/17   Ward, Ozella Almond, PA-C  sevelamer (RENAGEL) 800 MG tablet Take 800 mg by mouth 3 (three) times daily with meals.    [provider]  thiamine (VITAMIN B-1) 100 MG tablet Take 100 mg by mouth 3 (three) times a week.     [provider]  traMADol (ULTRAM) 50 MG tablet Take 1 tablet (50 mg total) by mouth every 6 (six) hours as needed. 78/58/85   Delora Fuel,  MD    Family History Family History  Problem Relation Age of Onset  . Diabetes Father   . Hypertension Father     Social History Social History   Tobacco Use  . Smoking status: Former Smoker    Packs/day: 1.00    Years: 25.00    Pack years: 25.00    Types: Cigarettes    Last attempt to quit: 08/01/2011    Years since quitting: 5.8  . Smokeless tobacco: Never Used  Substance Use Topics  . Alcohol use: Yes  . Drug use: Yes    Types: Cocaine    Comment: 05/17/17- cocaine 2 -3 weeks ago- "Now I go to Alcohol and Drug  classes- thinking for a change""     Allergies   Patient has no known allergies.  Review of Systems Review of Systems  Constitutional: Negative for fever.  HENT: Negative for rhinorrhea.   Respiratory: Positive for shortness of breath and wheezing. Negative for cough and sputum production.   Cardiovascular: Positive for chest pain and leg swelling.  Gastrointestinal: Negative for abdominal pain and vomiting.  Neurological: Negative for headaches.  All other systems reviewed and are negative.    Physical Exam Updated Vital Signs There were no vitals taken for this visit.  Physical Exam  Constitutional: He is oriented to person, place, and time. He appears well-developed and well-nourished. No distress.  HENT:  Head: Normocephalic and atraumatic.  Mouth/Throat: Oropharynx is clear and moist.  Eyes: Conjunctivae and EOM are normal. Pupils are equal, round, and reactive to light.  Neck: Normal range of motion. Neck supple. JVD present.  Cardiovascular: Normal rate, regular rhythm and intact distal pulses.  No murmur heard. Pulmonary/Chest: Effort normal. No respiratory distress. He has wheezes. He has no rales.  Scant diffuse wheezes  Abdominal: Soft. He exhibits no distension. There is no tenderness. There is no rebound and no guarding.  Musculoskeletal: Normal range of motion. He exhibits no edema.       Right lower leg: He exhibits tenderness.        Left lower leg: He exhibits tenderness.  1+ edema in bilateral lower legs  Neurological: He is alert and oriented to person, place, and time.  Skin: Skin is warm and dry. No rash noted. No erythema.  Psychiatric: He has a normal mood and affect. His behavior is normal.  Nursing note and vitals reviewed.    ED Treatments / Results  Labs (all labs ordered are listed, but only abnormal results are displayed) Labs Reviewed  CBC WITH DIFFERENTIAL/PLATELET - Abnormal; Notable for the following components:      Result Value   RBC 2.48 (*)    Hemoglobin 8.0 (*)    HCT 25.6 (*)    MCV 103.2 (*)    RDW 16.2 (*)    Neutro Abs 1.6 (*)    All other components within normal limits  I-STAT CHEM 8, ED - Abnormal; Notable for the following components:   BUN 75 (*)    Creatinine, Ser 16.90 (*)    Calcium, Ion 0.88 (*)    Hemoglobin 9.2 (*)    HCT 27.0 (*)    All other components within normal limits  I-STAT TROPONIN, ED    EKG  EKG Interpretation None     ED ECG REPORT   Date: 06/12/2017  Rate: 82  Rhythm: normal sinus rhythm  QRS Axis: normal  Intervals: normal  ST/T Wave abnormalities: nonspecific T wave changes  Conduction Disutrbances:none  Narrative Interpretation:   Old EKG Reviewed: unchanged  I have personally reviewed the EKG tracing and agree with the computerized printout as noted.   Radiology Dg Chest 2 View  Result Date: 06/12/2017 CLINICAL DATA:  Acute shortness of breath and chest congestion today. EXAM: CHEST  2 VIEW COMPARISON:  04/09/2017 and prior chest radiographs FINDINGS: Cardiomegaly and very mild interstitial opacities noted compatible with minimal interstitial edema. There is no evidence of airspace disease or pneumothorax. A trace amount digital fluid is noted. No acute bony abnormality is identified. IMPRESSION: Cardiomegaly with minimal interstitial pulmonary edema. Electronically Signed   By: Margarette Canada M.D.   On: 06/12/2017 00:06   Ct Angio  Chest Pe W And/or Wo Contrast  Result Date: 06/12/2017 CLINICAL DATA:  61 year old male with shortness of breath. EXAM: CT ANGIOGRAPHY  CHEST WITH CONTRAST TECHNIQUE: Multidetector CT imaging of the chest was performed using the standard protocol during bolus administration of intravenous contrast. Multiplanar CT image reconstructions and MIPs were obtained to evaluate the vascular anatomy. CONTRAST:  166mL ISOVUE-370 IOPAMIDOL (ISOVUE-370) INJECTION 76% COMPARISON:  Chest radiograph dated 06/11/2017 and CT dated 04/09/2017. FINDINGS: Cardiovascular: There is moderate cardiomegaly. There is enlargement of the right atrium. Retrograde flow of contrast from the right atrium into the IVC consistent with right cardiac dysfunction. No pericardial effusion. Multi vessel coronary vascular calcification noted. There is moderate atherosclerotic calcification of the thoracic aorta. There is atherosclerotic calcifications of the origins of the great vessels of the aortic arch. There is dilatation of the main pulmonary trunk suggestive of underlying pulmonary hypertension. Evaluation of the pulmonary arteries is somewhat limited due to respiratory motion artifact and suboptimal enhancement and visualization of the peripheral branches. There is no CT evidence of pulmonary artery embolism. Mediastinum/Nodes: No hilar adenopathy. Small scattered mediastinal lymph nodes. The esophagus is grossly unremarkable. No mediastinal fluid collection. Lungs/Pleura: Small focal area of ground-glass density in the posterior aspect of the left lower lobe may represent atelectatic changes. Developing infiltrate is not excluded. Clinical correlation and follow-up recommended. An infarct is less likely in the absence of pulmonary embolism. Upper Abdomen: Atrophic kidneys. The visualized upper abdomen is otherwise unremarkable. Musculoskeletal: Degenerative changes of the spine. No acute fracture. Review of the MIP images confirms the above  findings. IMPRESSION: 1. No CT evidence of pulmonary embolism. 2. Cardiomegaly with findings of right cardiac dysfunction. 3. Focal ground-glass density in the left lower lobe posteriorly may represent atelectatic changes versus developing infiltrate. Clinical correlation and follow-up recommended. 4.  Aortic Atherosclerosis (ICD10-I70.0). Electronically Signed   By: Anner Crete M.D.   On: 06/12/2017 01:50    Procedures Procedures (including critical care time)  Medications Ordered in ED Medications  albuterol (PROVENTIL HFA;VENTOLIN HFA) 108 (90 Base) MCG/ACT inhaler 2 puff (not administered)  HYDROcodone-acetaminophen (NORCO/VICODIN) 5-325 MG per tablet 1 tablet (1 tablet Oral Given 06/12/17 0029)  iopamidol (ISOVUE-370) 76 % injection (100 mLs Intravenous Contrast Given 06/12/17 0058)     Initial Impression / Assessment and Plan / ED Course  I have reviewed the triage vital signs and the nursing notes.  Pertinent labs & imaging results that were available during my care of the patient were reviewed by me and considered in my medical decision making (see chart for details).     Patient with multiple medical problems presenting with abruptly worsening shortness of breath.  The shortness of breath started today but worsened tonight when he laid down to go to bed.  Patient is a hemodialysis patient and missed dialysis on Friday.  Patient does have some signs of fluid overload as well as hypertension.  He was given 2 nitroglycerin by paramedics and also due to wheezing and history of COPD was given albuterol, Solu-Medrol and upon arrival here patient is in no acute distress.  He states that shortness of breath is improved but not gone.  He has scant wheezes but no notable rales.  He does have JVD and mild distal edema.  Concern for fluid overload from missed dialysis.  Also could have a COPD exacerbation however patient denies any excessive coughing or URI symptoms in the last few days. EKG  without acute findings.  CBC, BMP, troponin, chest x-ray pending.  Patient's oxygen saturation currently is 100%.  He is hypertensive at 182/108.  12:21 AM On reevaluation labs are relatively unchanged other  than uremia from missing dialysis.  Chest x-ray with cardiomegaly and minimal interstitial pulmonary edema.  On reevaluation patient states since he has been here he is developed a sharp stabbing pleuritic type pain in the center of his chest which is worse with breathing.  His wheezing has now resolved and he feels that his breathing is much better.  However given recent sudden onset of shortness of breath now pleuritic type chest pain will do a CT to rule out PE.  2:20 AM CT is negative for PE.  Did show some mild groundglass opacity in the left lower lobe which could be atelectasis Bertin versus early infiltrate however patient on repeat exam denies any URI symptoms, normal white count, afebrile here.  Feel most likely atelectasis.  Patient continues to breathe comfortably with oxygen saturation of 100% without assistance.  Will send home with an inhaler as he lost his and was given a course of prednisone.  He can dialyze in the morning.  Final Clinical Impressions(s) / ED Diagnoses   Final diagnoses:  COPD exacerbation [facility])    ED Discharge Orders        Ordered    predniSONE (DELTASONE) 20 MG tablet  Daily     06/12/17 0214       Blanchie Dessert, MD 06/12/17 0221

## 2017-06-11 NOTE — ED Triage Notes (Signed)
Patient arrived with EMS from home reports worsening SOB with chest congestion /wheezintg onset today  , he missed his hemodialysis Friday , received Solumedrol 125 mg IV , Albuterol and Duoned by EMS with mild relief. Denies fever or chills .

## 2017-06-12 ENCOUNTER — Emergency Department (HOSPITAL_COMMUNITY): Payer: Medicare Other

## 2017-06-12 DIAGNOSIS — D631 Anemia in chronic kidney disease: Secondary | ICD-10-CM | POA: Diagnosis not present

## 2017-06-12 DIAGNOSIS — N186 End stage renal disease: Secondary | ICD-10-CM | POA: Diagnosis not present

## 2017-06-12 DIAGNOSIS — I700 Atherosclerosis of aorta: Secondary | ICD-10-CM | POA: Diagnosis not present

## 2017-06-12 DIAGNOSIS — D509 Iron deficiency anemia, unspecified: Secondary | ICD-10-CM | POA: Diagnosis not present

## 2017-06-12 DIAGNOSIS — J441 Chronic obstructive pulmonary disease with (acute) exacerbation: Secondary | ICD-10-CM | POA: Diagnosis not present

## 2017-06-12 DIAGNOSIS — R0602 Shortness of breath: Secondary | ICD-10-CM | POA: Diagnosis not present

## 2017-06-12 DIAGNOSIS — N2581 Secondary hyperparathyroidism of renal origin: Secondary | ICD-10-CM | POA: Diagnosis not present

## 2017-06-12 IMAGING — CT CT ANGIO CHEST
3 of 7 series · 17 of 36 positions shown · IV contrast (Omni 300)
Comparison: Chest radiograph dated [DATE] and CT dated
[DATE].

CLINICAL DATA: 61-year-old male with shortness of breath.

EXAM:
CT ANGIOGRAPHY CHEST WITH CONTRAST
TECHNIQUE: Multidetector CT imaging of the chest was performed using the
standard protocol during bolus administration of intravenous
contrast. Multiplanar CT image reconstructions and MIPs were
obtained to evaluate the vascular anatomy.
CONTRAST:  100mL [LT] IOPAMIDOL ([LT]) INJECTION 76%

[Series 6: pe lung · axial · 0.72mm/px · z∈[+1098,+1272]mm · 4 of 145 slices shown]
[im 29/145  mediastinal]
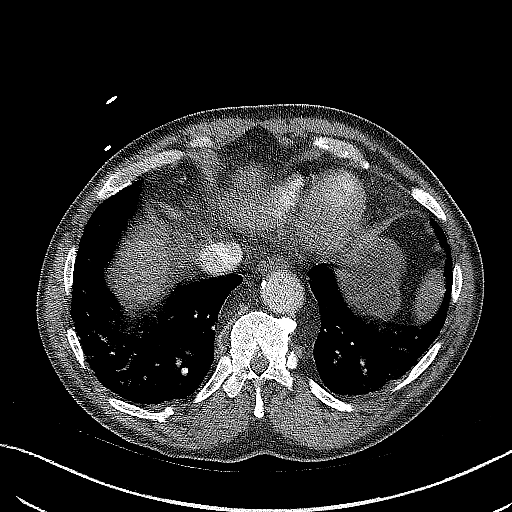
[im 58/145  mediastinal]
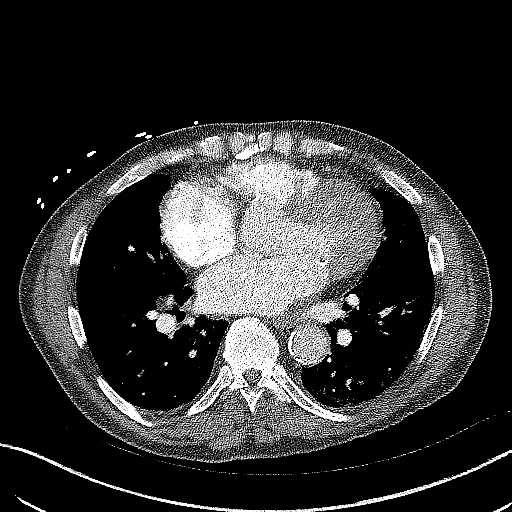
[im 87/145  mediastinal]
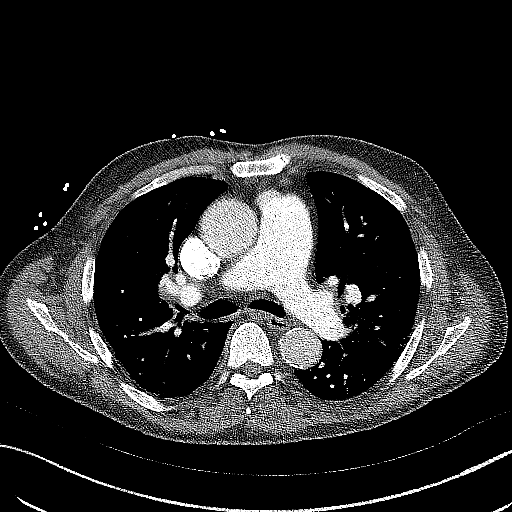
[im 116/145  mediastinal]
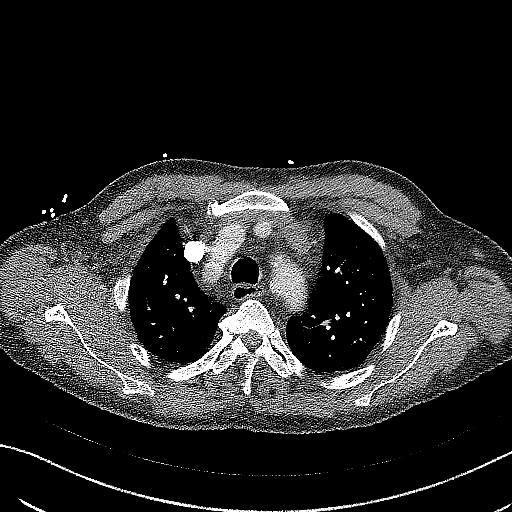

[Series 7: pe thins · axial · 0.72mm/px · z∈[+1034,+1306]mm · 12 of 322 slices shown]
[im 25/322  lung]
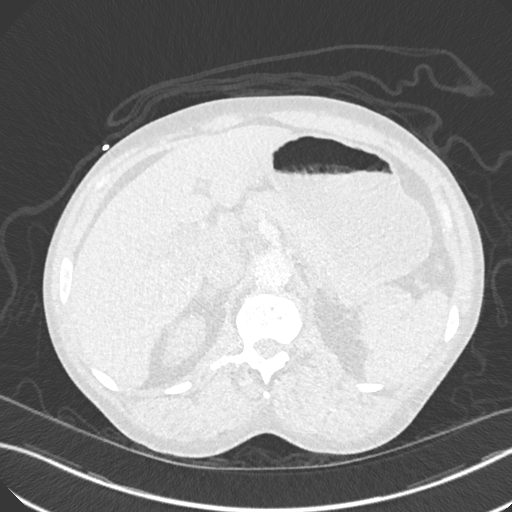
[im 50/322  mediastinal]
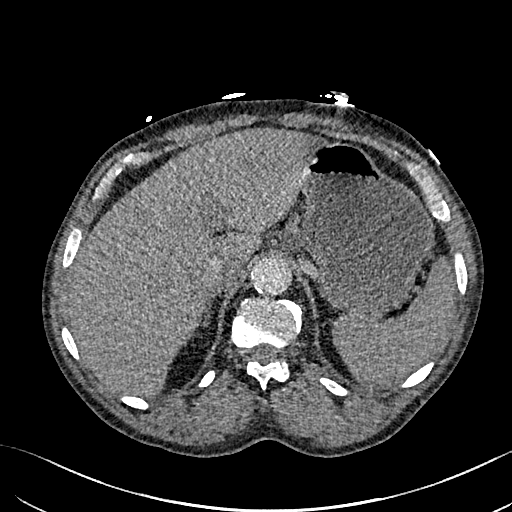
[im 75/322  lung]
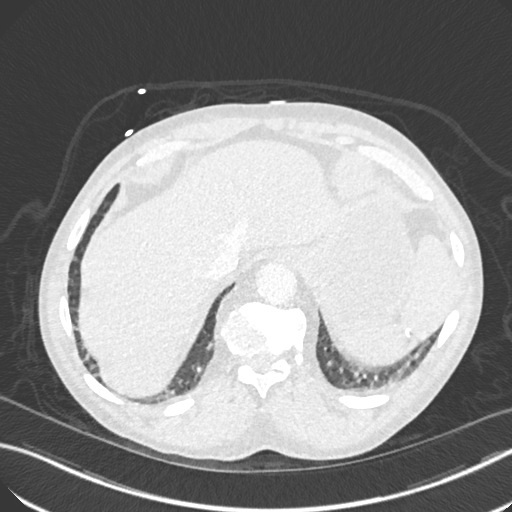
[im 99/322  mediastinal]
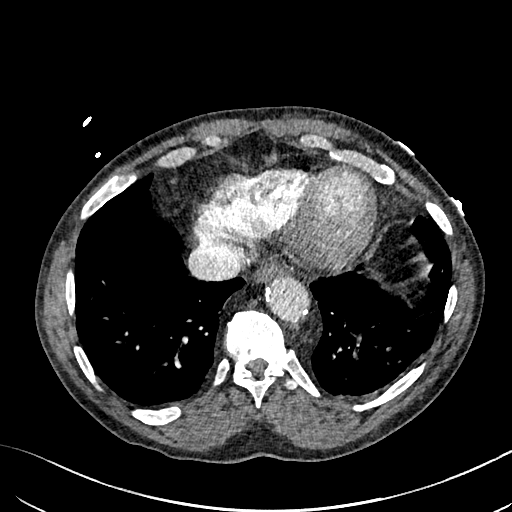
[im 124/322  lung]
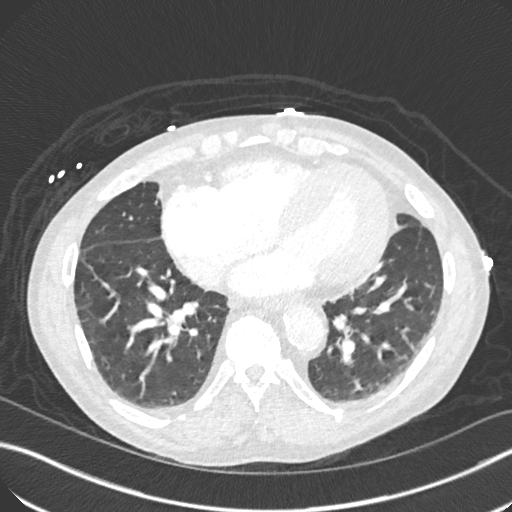
[im 149/322  mediastinal]
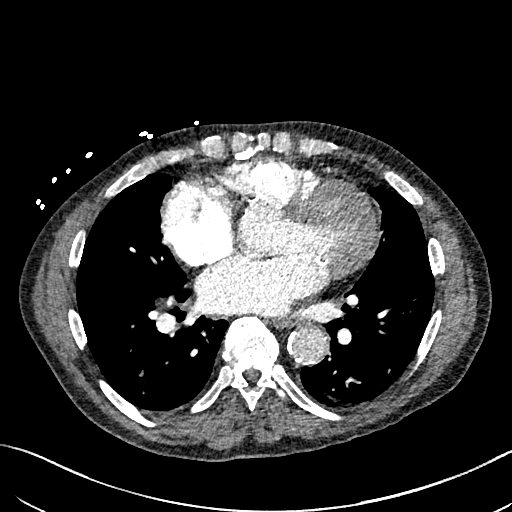
[im 173/322  lung]
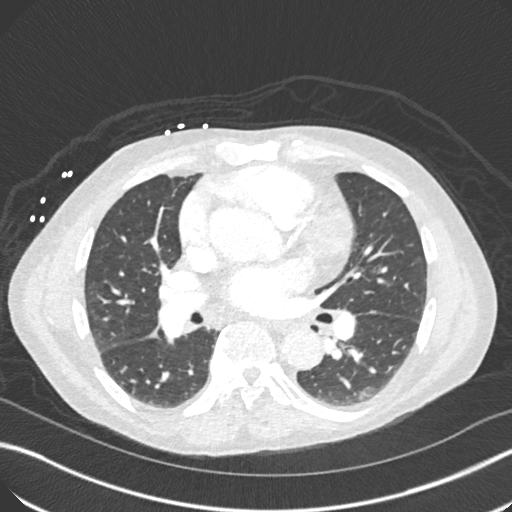
[im 198/322  mediastinal]
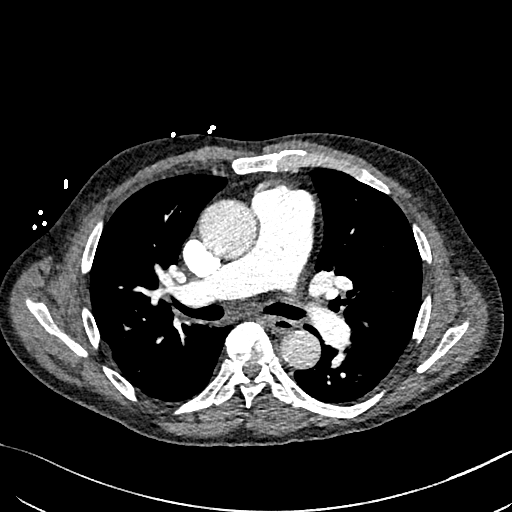
[im 223/322  lung]
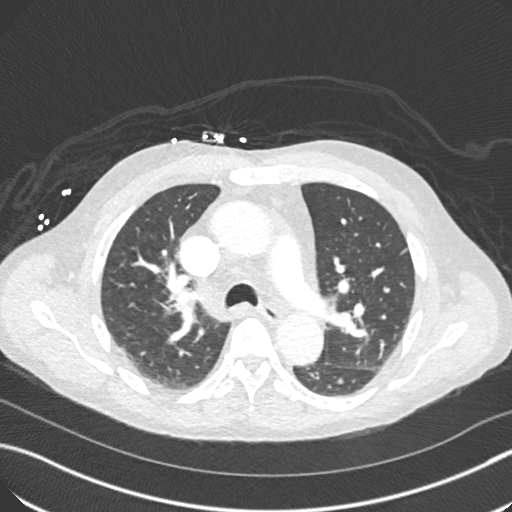
[im 247/322  mediastinal]
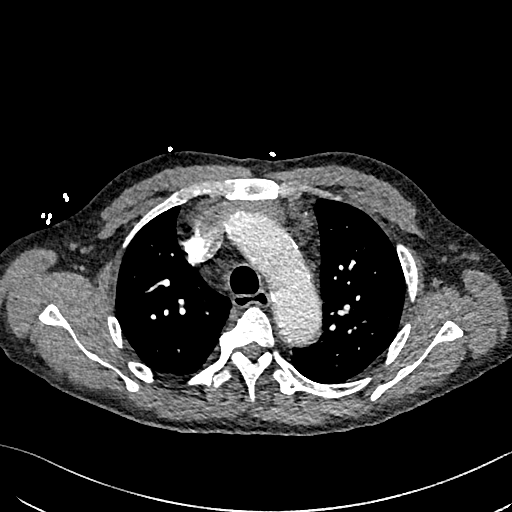
[im 272/322  lung]
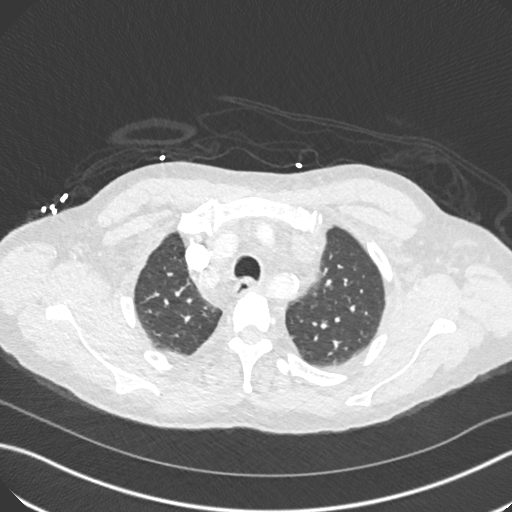
[im 297/322  mediastinal]
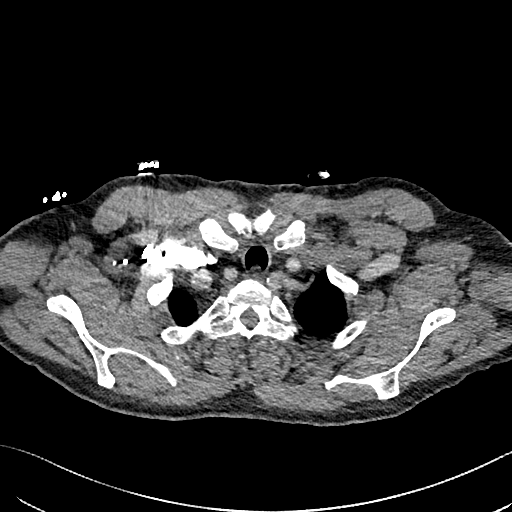

[Series 8: pe 2mm cor · coronal · 0.63mm/px · 1 of 151 slices shown]
[im 76/151  mediastinal]
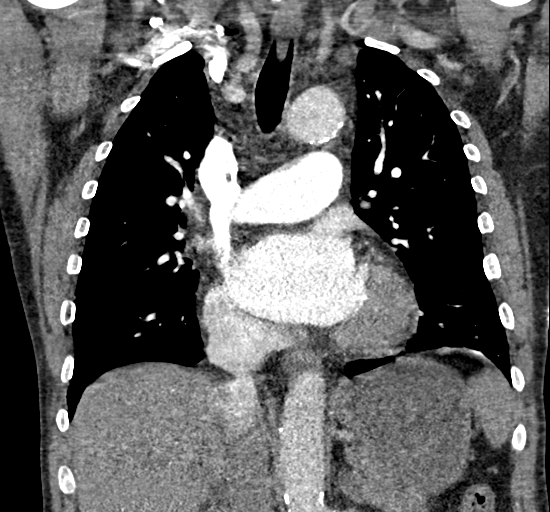

[17 of 36 positions shown; findings below may reference images not displayed]

FINDINGS: Cardiovascular: There is moderate cardiomegaly. There is enlargement
of the right atrium. Retrograde flow of contrast from the right
atrium into the IVC consistent with right cardiac dysfunction. No
pericardial effusion. Multi vessel coronary vascular calcification
noted. There is moderate atherosclerotic calcification of the
thoracic aorta. There is atherosclerotic calcifications of the
origins of the great vessels of the aortic arch. There is dilatation
of the main pulmonary trunk suggestive of underlying pulmonary
hypertension. Evaluation of the pulmonary arteries is somewhat
limited due to respiratory motion artifact and suboptimal
enhancement and visualization of the peripheral branches. There is
no CT evidence of pulmonary artery embolism.

Mediastinum/Nodes: No hilar adenopathy. Small scattered mediastinal
lymph nodes. The esophagus is grossly unremarkable. No mediastinal
fluid collection.

Lungs/Pleura: Small focal area of ground-glass density in the
posterior aspect of the left lower lobe may represent atelectatic
changes. Developing infiltrate is not excluded. Clinical correlation
and follow-up recommended. An infarct is less likely in the absence
of pulmonary embolism.

Upper Abdomen: Atrophic kidneys. The visualized upper abdomen is
otherwise unremarkable.

Musculoskeletal: Degenerative changes of the spine. No acute
fracture.

Review of the MIP images confirms the above findings.
IMPRESSION: 1. No CT evidence of pulmonary embolism.
2. Cardiomegaly with findings of right cardiac dysfunction.
3. Focal ground-glass density in the left lower lobe posteriorly may
represent atelectatic changes versus developing infiltrate. Clinical
correlation and follow-up recommended.
4.  Aortic Atherosclerosis ([LT]-[LT]).

## 2017-06-12 MED ORDER — IOPAMIDOL (ISOVUE-370) INJECTION 76%
INTRAVENOUS | Status: AC
  Administered 2017-06-12: 100 mL via INTRAVENOUS
  Filled 2017-06-12: qty 100

## 2017-06-12 MED ORDER — HYDROCODONE-ACETAMINOPHEN 5-325 MG PO TABS
1.0000 | ORAL_TABLET | Freq: Once | ORAL | Status: AC
  Administered 2017-06-12: 1 via ORAL
  Filled 2017-06-12: qty 1

## 2017-06-12 MED ORDER — PREDNISONE 20 MG PO TABS: 40.0000 mg | ORAL_TABLET | Freq: Every day | ORAL | 0 refills | Status: DC

## 2017-06-12 MED ORDER — ALBUTEROL SULFATE HFA 108 (90 BASE) MCG/ACT IN AERS
2.0000 | INHALATION_SPRAY | RESPIRATORY_TRACT | Status: DC | PRN
  Filled 2017-06-12: qty 6.7

## 2017-06-12 NOTE — ED Notes (Signed)
Pt given ice per MD

## 2017-06-12 NOTE — ED Notes (Signed)
Patient transported to CT scan . 

## 2017-06-20 ENCOUNTER — Emergency Department (HOSPITAL_COMMUNITY): Payer: Medicare Other

## 2017-06-20 ENCOUNTER — Encounter (HOSPITAL_COMMUNITY): Payer: Self-pay

## 2017-06-20 ENCOUNTER — Other Ambulatory Visit: Payer: Self-pay

## 2017-06-20 ENCOUNTER — Inpatient Hospital Stay (HOSPITAL_COMMUNITY)
Admission: EM | Admit: 2017-06-20 | Discharge: 2017-06-22 | DRG: 640 | Disposition: A | Discharge status: Home / Self Care | Payer: Medicare Other | Attending: Internal Medicine | Admitting: Internal Medicine

## 2017-06-20 DIAGNOSIS — J811 Chronic pulmonary edema: Secondary | ICD-10-CM | POA: Diagnosis not present

## 2017-06-20 DIAGNOSIS — F1410 Cocaine abuse, uncomplicated: Secondary | ICD-10-CM | POA: Diagnosis present

## 2017-06-20 DIAGNOSIS — Z9115 Patient's noncompliance with renal dialysis: Secondary | ICD-10-CM

## 2017-06-20 DIAGNOSIS — E8770 Fluid overload, unspecified: Principal | ICD-10-CM | POA: Diagnosis present

## 2017-06-20 DIAGNOSIS — E875 Hyperkalemia: Secondary | ICD-10-CM

## 2017-06-20 DIAGNOSIS — N186 End stage renal disease: Secondary | ICD-10-CM | POA: Diagnosis present

## 2017-06-20 DIAGNOSIS — F17210 Nicotine dependence, cigarettes, uncomplicated: Secondary | ICD-10-CM | POA: Diagnosis present

## 2017-06-20 DIAGNOSIS — Z992 Dependence on renal dialysis: Secondary | ICD-10-CM

## 2017-06-20 DIAGNOSIS — Z590 Homelessness: Secondary | ICD-10-CM

## 2017-06-20 DIAGNOSIS — D631 Anemia in chronic kidney disease: Secondary | ICD-10-CM | POA: Diagnosis present

## 2017-06-20 DIAGNOSIS — J449 Chronic obstructive pulmonary disease, unspecified: Secondary | ICD-10-CM | POA: Diagnosis present

## 2017-06-20 DIAGNOSIS — Z79899 Other long term (current) drug therapy: Secondary | ICD-10-CM

## 2017-06-20 DIAGNOSIS — Z9114 Patient's other noncompliance with medication regimen: Secondary | ICD-10-CM

## 2017-06-20 DIAGNOSIS — Z79891 Long term (current) use of opiate analgesic: Secondary | ICD-10-CM

## 2017-06-20 DIAGNOSIS — Z8249 Family history of ischemic heart disease and other diseases of the circulatory system: Secondary | ICD-10-CM

## 2017-06-20 DIAGNOSIS — K5900 Constipation, unspecified: Secondary | ICD-10-CM | POA: Diagnosis present

## 2017-06-20 DIAGNOSIS — J810 Acute pulmonary edema: Secondary | ICD-10-CM

## 2017-06-20 DIAGNOSIS — Z7951 Long term (current) use of inhaled steroids: Secondary | ICD-10-CM

## 2017-06-20 DIAGNOSIS — R079 Chest pain, unspecified: Secondary | ICD-10-CM | POA: Diagnosis not present

## 2017-06-20 DIAGNOSIS — I160 Hypertensive urgency: Secondary | ICD-10-CM | POA: Diagnosis present

## 2017-06-20 DIAGNOSIS — I1311 Hypertensive heart and chronic kidney disease without heart failure, with stage 5 chronic kidney disease, or end stage renal disease: Secondary | ICD-10-CM | POA: Diagnosis present

## 2017-06-20 DIAGNOSIS — R0789 Other chest pain: Secondary | ICD-10-CM | POA: Diagnosis not present

## 2017-06-20 DIAGNOSIS — R0600 Dyspnea, unspecified: Secondary | ICD-10-CM | POA: Diagnosis present

## 2017-06-20 DIAGNOSIS — I248 Other forms of acute ischemic heart disease: Secondary | ICD-10-CM | POA: Diagnosis present

## 2017-06-20 DIAGNOSIS — R0602 Shortness of breath: Secondary | ICD-10-CM | POA: Diagnosis not present

## 2017-06-20 LAB — CBC WITH DIFFERENTIAL/PLATELET
BASOS ABS: 0 10*3/uL (ref 0.0–0.1)
Basophils Relative: 0 %
EOS ABS: 0 10*3/uL (ref 0.0–0.7)
EOS PCT: 0 %
HCT: 27.7 % — ABNORMAL LOW (ref 39.0–52.0)
Hemoglobin: 8.8 g/dL — ABNORMAL LOW (ref 13.0–17.0)
LYMPHS ABS: 0.5 10*3/uL — AB (ref 0.7–4.0)
Lymphocytes Relative: 13 %
MCH: 33.1 pg (ref 26.0–34.0)
MCHC: 31.8 g/dL (ref 30.0–36.0)
MCV: 104.1 fL — ABNORMAL HIGH (ref 78.0–100.0)
Monocytes Absolute: 0.1 10*3/uL (ref 0.1–1.0)
Monocytes Relative: 3 %
Neutro Abs: 3 10*3/uL (ref 1.7–7.7)
Neutrophils Relative %: 84 %
PLATELETS: 118 10*3/uL — AB (ref 150–400)
RBC: 2.66 MIL/uL — AB (ref 4.22–5.81)
RDW: 17.1 % — AB (ref 11.5–15.5)
WBC: 3.6 10*3/uL — AB (ref 4.0–10.5)

## 2017-06-20 LAB — COMPREHENSIVE METABOLIC PANEL
ALT: 16 U/L — AB (ref 17–63)
ANION GAP: 20 — AB (ref 5–15)
AST: 41 U/L (ref 15–41)
Albumin: 3.2 g/dL — ABNORMAL LOW (ref 3.5–5.0)
Alkaline Phosphatase: 78 U/L (ref 38–126)
BUN: 109 mg/dL — ABNORMAL HIGH (ref 6–20)
CHLORIDE: 104 mmol/L (ref 101–111)
CO2: 18 mmol/L — ABNORMAL LOW (ref 22–32)
CREATININE: 24.03 mg/dL — AB (ref 0.61–1.24)
Calcium: 8.9 mg/dL (ref 8.9–10.3)
GFR calc non Af Amer: 2 mL/min — ABNORMAL LOW (ref >60)
GFR, EST AFRICAN AMERICAN: 2 mL/min — AB (ref >60)
Glucose, Bld: 106 mg/dL — ABNORMAL HIGH (ref 65–99)
POTASSIUM: 7.2 mmol/L — AB (ref 3.5–5.1)
SODIUM: 142 mmol/L (ref 135–145)
Total Bilirubin: 0.6 mg/dL (ref 0.3–1.2)
Total Protein: 7.6 g/dL (ref 6.5–8.1)

## 2017-06-20 LAB — RETICULOCYTES
RBC.: 2.67 MIL/uL — AB (ref 4.22–5.81)
RETIC CT PCT: 3.5 % — AB (ref 0.4–3.1)
Retic Count, Absolute: 93.5 10*3/uL (ref 19.0–186.0)

## 2017-06-20 LAB — VITAMIN B12: VITAMIN B 12: 548 pg/mL (ref 180–914)

## 2017-06-20 LAB — IRON AND TIBC
Iron: 62 ug/dL (ref 45–182)
SATURATION RATIOS: 18 % (ref 17.9–39.5)
TIBC: 353 ug/dL (ref 250–450)
UIBC: 291 ug/dL

## 2017-06-20 LAB — MRSA PCR SCREENING: MRSA BY PCR: NEGATIVE

## 2017-06-20 LAB — SAVE SMEAR

## 2017-06-20 LAB — CBG MONITORING, ED: Glucose-Capillary: 72 mg/dL (ref 65–99)

## 2017-06-20 LAB — FOLATE: Folate: 7.7 ng/mL (ref >5.9)

## 2017-06-20 LAB — TROPONIN I
TROPONIN I: 0.05 ng/mL — AB (ref <0.03)
Troponin I: 0.03 ng/mL (ref <0.03)
Troponin I: 0.05 ng/mL (ref <0.03)

## 2017-06-20 IMAGING — CR DG CHEST 2V
2 series · 2 of 2 positions shown · non-contrast
Comparison: Chest x-rays dated [DATE] and [DATE] and chest
CT dated [DATE]

CLINICAL DATA: Chest pain and shortness of breath.

EXAM:
CHEST  2 VIEW

[chest pa]
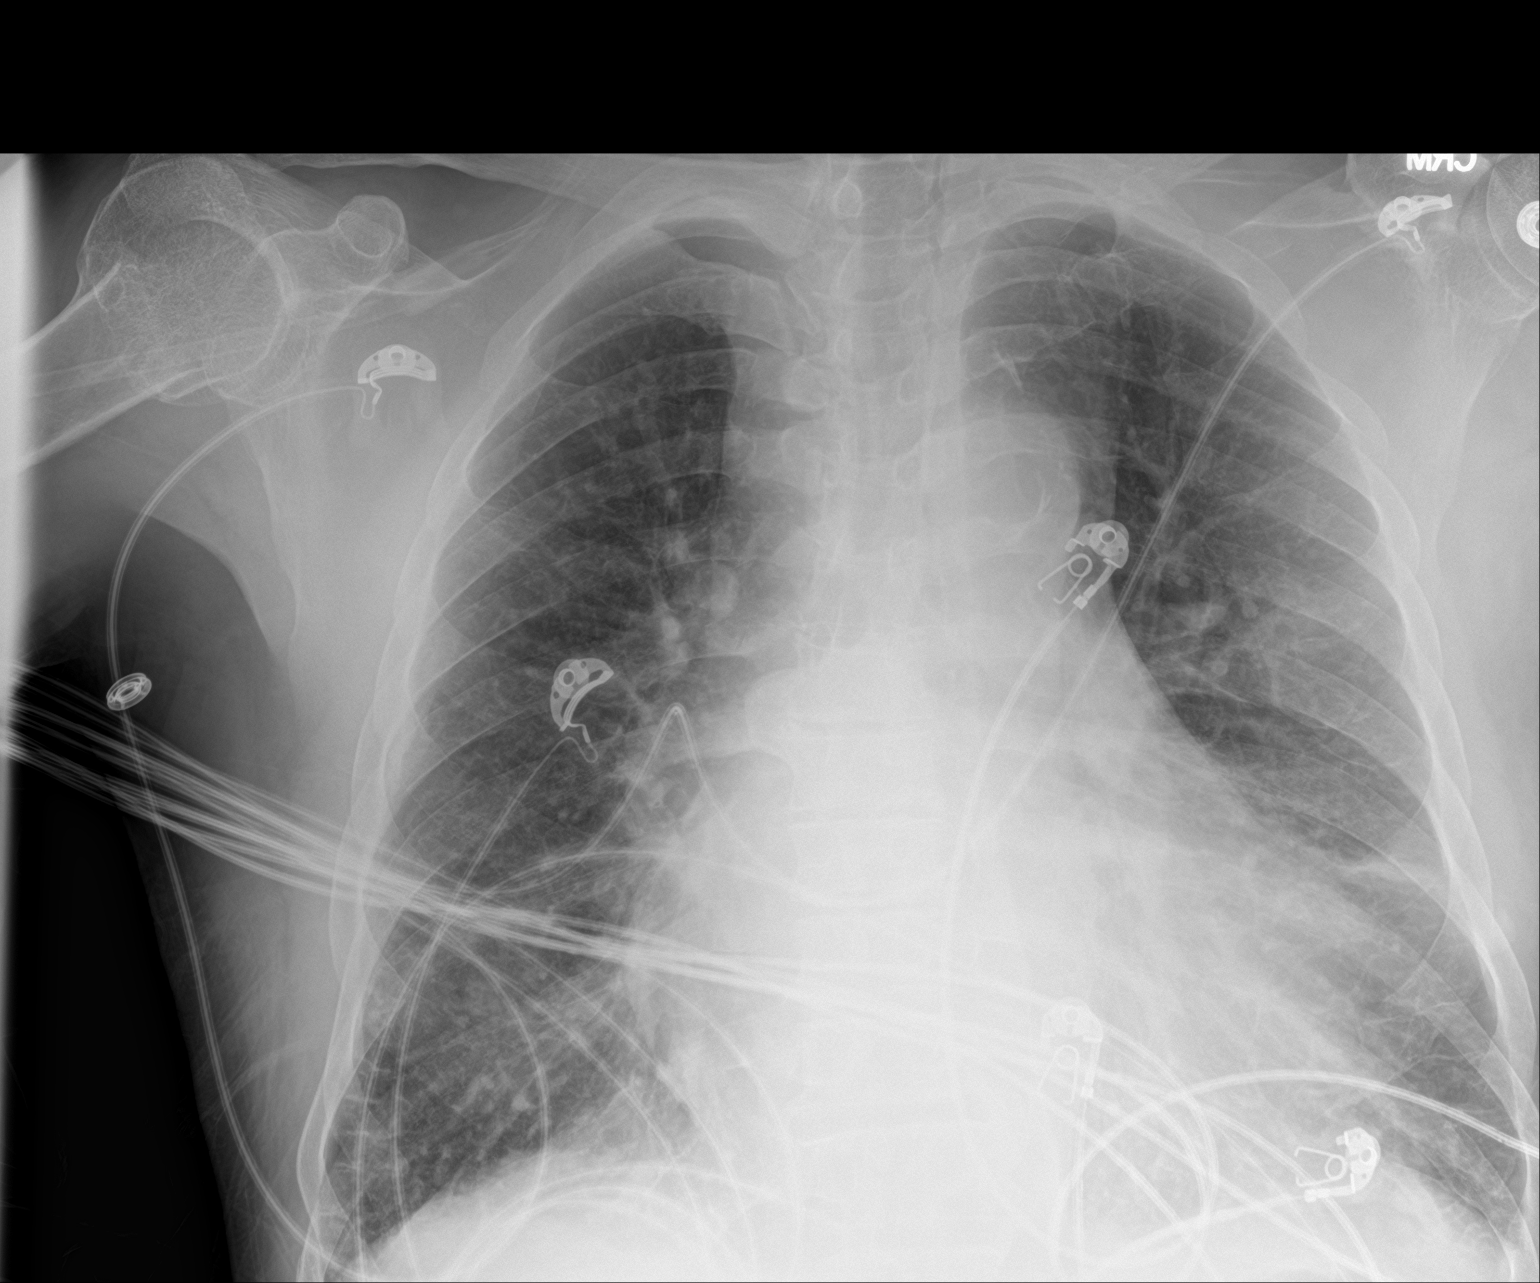

[chest lat]
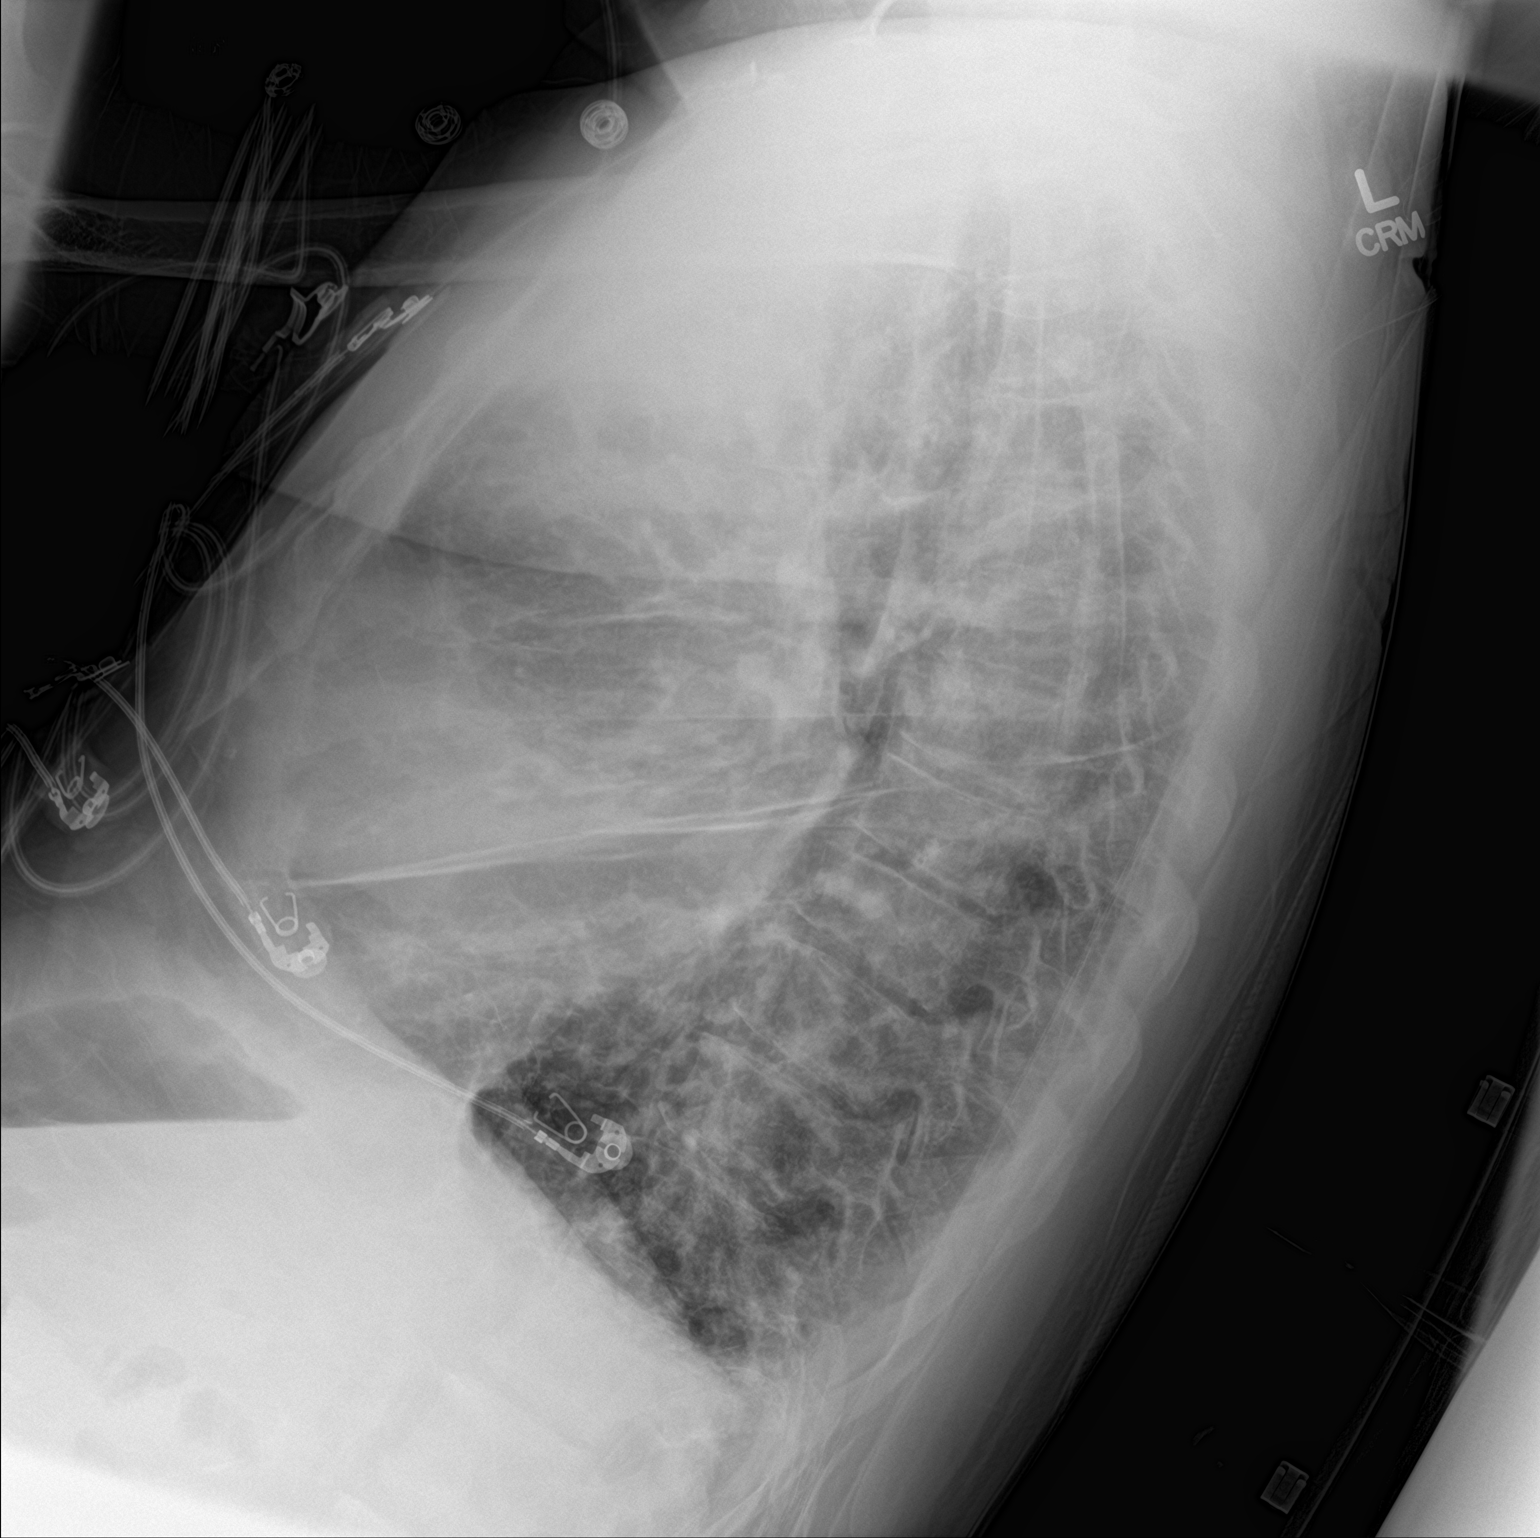

[2 of 2 positions shown; findings below may reference images not displayed]

FINDINGS: There are more prominent Kerley B-lines at both lung bases. Slight
new blunting of the posterior costophrenic angles consistent with
tiny effusions. Heart size appears slightly more prominent but the
vascularity remains within normal limits.

Aortic atherosclerosis.

No acute bone abnormality.
IMPRESSION: Progressive mild pulmonary edema.

Aortic atherosclerosis.

## 2017-06-20 MED ORDER — CALCITRIOL 0.5 MCG PO CAPS
2.7500 ug | ORAL_CAPSULE | Freq: Once | ORAL | Status: AC
  Administered 2017-06-20: 2.75 ug via ORAL

## 2017-06-20 MED ORDER — NITROGLYCERIN 0.4 MG SL SUBL
0.4000 mg | SUBLINGUAL_TABLET | SUBLINGUAL | Status: DC | PRN
  Administered 2017-06-20 (×3): 0.4 mg via SUBLINGUAL
  Filled 2017-06-20: qty 1

## 2017-06-20 MED ORDER — INSULIN ASPART 100 UNIT/ML ~~LOC~~ SOLN
5.0000 [IU] | Freq: Once | SUBCUTANEOUS | Status: AC
  Administered 2017-06-20: 5 [IU] via INTRAVENOUS
  Filled 2017-06-20: qty 1

## 2017-06-20 MED ORDER — INSULIN ASPART 100 UNIT/ML IV SOLN: 5.0000 [IU] | Freq: Once | INTRAVENOUS | Status: DC

## 2017-06-20 MED ORDER — SODIUM CHLORIDE 0.9 % IV SOLN
125.0000 mg | Freq: Once | INTRAVENOUS | Status: AC
  Administered 2017-06-20: 125 mg via INTRAVENOUS
  Filled 2017-06-20: qty 10

## 2017-06-20 MED ORDER — CALCITRIOL 0.5 MCG PO CAPS
ORAL_CAPSULE | ORAL | Status: AC
  Filled 2017-06-20: qty 4

## 2017-06-20 MED ORDER — HYDRALAZINE HCL 20 MG/ML IJ SOLN: 10.0000 mg | INTRAMUSCULAR | Status: DC | PRN

## 2017-06-20 MED ORDER — HYDRALAZINE HCL 20 MG/ML IJ SOLN
10.0000 mg | Freq: Four times a day (QID) | INTRAMUSCULAR | Status: DC | PRN
  Administered 2017-06-20: 10 mg via INTRAVENOUS
  Filled 2017-06-20: qty 1

## 2017-06-20 MED ORDER — AMLODIPINE BESYLATE 5 MG PO TABS
5.0000 mg | ORAL_TABLET | Freq: Every day | ORAL | Status: DC
  Administered 2017-06-20 – 2017-06-22 (×3): 5 mg via ORAL
  Filled 2017-06-20 (×3): qty 1

## 2017-06-20 MED ORDER — METOPROLOL SUCCINATE ER 25 MG PO TB24
25.0000 mg | ORAL_TABLET | Freq: Every day | ORAL | Status: DC
  Administered 2017-06-20: 25 mg via ORAL
  Filled 2017-06-20: qty 1

## 2017-06-20 MED ORDER — HEPARIN SODIUM (PORCINE) 5000 UNIT/ML IJ SOLN
5000.0000 [IU] | Freq: Three times a day (TID) | INTRAMUSCULAR | Status: DC
  Administered 2017-06-21 – 2017-06-22 (×4): 5000 [IU] via SUBCUTANEOUS
  Filled 2017-06-20 (×5): qty 1

## 2017-06-20 MED ORDER — ACETAMINOPHEN 650 MG RE SUPP: 650.0000 mg | Freq: Four times a day (QID) | RECTAL | Status: DC | PRN

## 2017-06-20 MED ORDER — DARBEPOETIN ALFA 200 MCG/0.4ML IJ SOSY
200.0000 ug | PREFILLED_SYRINGE | Freq: Once | INTRAMUSCULAR | Status: AC
  Administered 2017-06-20: 200 ug via INTRAVENOUS

## 2017-06-20 MED ORDER — DARBEPOETIN ALFA 200 MCG/0.4ML IJ SOSY
PREFILLED_SYRINGE | INTRAMUSCULAR | Status: AC
  Filled 2017-06-20: qty 0.4

## 2017-06-20 MED ORDER — DEXTROSE 50 % IV SOLN
50.0000 mL | Freq: Once | INTRAVENOUS | Status: AC
  Administered 2017-06-20: 50 mL via INTRAVENOUS
  Filled 2017-06-20: qty 50

## 2017-06-20 MED ORDER — SODIUM CHLORIDE 0.9% FLUSH
3.0000 mL | Freq: Two times a day (BID) | INTRAVENOUS | Status: DC
  Administered 2017-06-21 – 2017-06-22 (×3): 3 mL via INTRAVENOUS

## 2017-06-20 MED ORDER — ACETAMINOPHEN 325 MG PO TABS
650.0000 mg | ORAL_TABLET | Freq: Four times a day (QID) | ORAL | Status: DC | PRN
Start: 2017-06-20 — End: 2017-06-22

## 2017-06-20 MED ORDER — SODIUM CHLORIDE 0.9 % IV SOLN
1.0000 g | Freq: Once | INTRAVENOUS | Status: AC
  Administered 2017-06-20: 1 g via INTRAVENOUS
  Filled 2017-06-20: qty 10

## 2017-06-20 MED ORDER — ALBUTEROL SULFATE (2.5 MG/3ML) 0.083% IN NEBU: 2.5000 mg | INHALATION_SOLUTION | Freq: Four times a day (QID) | RESPIRATORY_TRACT | Status: DC | PRN

## 2017-06-20 MED ORDER — TRAMADOL HCL 50 MG PO TABS
50.0000 mg | ORAL_TABLET | Freq: Two times a day (BID) | ORAL | Status: DC | PRN
  Administered 2017-06-21: 50 mg via ORAL
  Filled 2017-06-20: qty 1

## 2017-06-20 MED ORDER — CALCITRIOL 0.25 MCG PO CAPS
ORAL_CAPSULE | ORAL | Status: AC
  Filled 2017-06-20: qty 3

## 2017-06-20 MED ORDER — CINACALCET HCL 30 MG PO TABS
180.0000 mg | ORAL_TABLET | Freq: Once | ORAL | Status: AC
  Administered 2017-06-20: 180 mg via ORAL
  Filled 2017-06-20: qty 6

## 2017-06-20 NOTE — Care Management Note (Signed)
Case Management Note  Patient Details  Name: Raymond [name] MRN: [id number] Date of Birth: [birth year]  Subjective/Objective:                  61 yo Patient with history of COPD and end-stage renal disease on hemodialysis (has not had HD for 2 weeks) presents with worsening shortness of breath and chest pain. From home alone.  Action/Plan: Admit status OBSERVATION (HYPERKALEMIA, ACUTE PUL EDEMA, CP); anticipate discharge [place].  Expected Discharge Date:  (unknown)               Expected Discharge Plan:  Home/Self Care  In-House Referral:  Clinical Social Work   Status of Service:  In process, will continue to follow  If discussed at Long Length of Stay Meetings, dates discussed:    Additional Comments: HD CLINIC:MWF; [employer] aware of admission. HD patient, please notify [facility] [phone]) when pt ready for discharge.   Fuller Mandril, RN 06/20/2017, 10:24 AM

## 2017-06-20 NOTE — Progress Notes (Signed)
Drarryl Klayman is a 61 year old AA M with ESRD secondary to HTN on HD since July 2013.  He has a history of treated Hep C - undetectable viral load 08/2016, substance abuse, homelessness, prior incarceration, poor adherence to dialysis.  He is on a MWF schedule at [facility].  His last treatment was 12/3.  He rans his full treatment with a net UF of 5 L and post HD wt 71.7 (EDW 71).  Post HD BP were 150 - 170/90s.  Usual HD orders are 4 hr EDW 71 2K 2 Ca no heparin profile 4 left lower AVF sensipar 180 with HD venofer 50 per week, Mircera 200 q 2 weeks - last 11/28 calcitriol 2.75 q HD  Pertinent labs today K 7.2 CO2 18 Cr 24 BUN 109 hgb 8.8 declining WBC 3.6 plts 118. Mild pulmonary edema.  BP elevated at 222/124 P 105 afebrile.  HD orders written.  He will be dialyzed emergently due to ^ K when the next available space opens up.  Full consult will be done if admitted to inpatient status.  It is possible he could be d/c post HD to return to usual HD unit tomorrow.    Amalia Hailey, PA-C [facility] [phone]

## 2017-06-20 NOTE — ED Notes (Signed)
Attempted report to 3E after sl nitro and bp meds given, Anderson Malta, RN on 3E advised this RN that they will not be able to take patient due to active chest pain of 1/10. Charge RN Janett Billow made aware

## 2017-06-20 NOTE — ED Notes (Signed)
Pt reports 0/10 chest pain without pain medication. Bed placement called, pt placed back on list for telemetry unit.

## 2017-06-20 NOTE — ED Notes (Signed)
Admitting paged regarding pts chest pain, despite being medicated and made aware tele floor will not accept patient with active chest pain.

## 2017-06-20 NOTE — Procedures (Signed)
Pt seen on HD, vol overload with pulm edema on exam and CXR.  Plan is for 4-5 L UF today on HD and see how he feels/ looks after HD session.  If possible will dc him home after HD.    I was present at this dialysis session, have reviewed the session itself and made  appropriate changes Kelly Splinter MD [place] pager [phone]   06/20/2017, 2:32 PM

## 2017-06-20 NOTE — H&P (Signed)
Date: 06/20/2017               Patient Name:  Raymond [name] MRN: [id number]  DOB: [birth year] Age / Sex: 61 y.o., male   PCP: Patient, No Pcp Per         Medical Service: Internal Medicine Teaching Service         Attending Physician: Dr. Sid Falcon, MD    First Contact: Dr. Ronalee Red Pager: [phone]  Second Contact: Dr. Burney Gauze Pager: [phone]       After Hours (After 5p/  First Contact Pager: [phone]  weekends / holidays): Second Contact Pager: [phone]   Chief Complaint:  dyspnea  History of Present Illness:  This is a 61 yo patient with ESRD on MWF dialysis, COPD, HTN, substance abuse, anemia, treated Hep C. Patient presents with 2 day history of dyspnea and some chest pain. He says that he has not gone to his dialysis in 2 weeks. He denies fevers, chills. Has had some dry cough. Denies orthopnea- sleeps on 1 pillow, and denies PND.  Denies nausea, vomiting, diarrhea. He makes very occasional urine. He has not taken most of his meds in over a week including his antihypertensives. He endorses some headache, and has blurry vision at baseline.  Regarding chest pain, initially he denied chest pain to me, then he said that he had some 5/10 substernal chest pain that is constant, not worse with inspiration. Of Note, he had a recent CT angio done which was negative for PE. He has been seen several times in the ER regarding dyspnea due to missed dialysis. His chest pain improved after nitro   He goes to [facility] for dialysis MWF and last dialysis for 2 weeks ago. His estimated dry weight per prior notes seem to be around 73 kilograms. His dialysis sessions usually run about 4 hours. Patient says that he just stopped going to dialysis because this 'mentally bothers him', and he is not able to continue this lifestyle. Then he realized today ' he has to deal with this' so he came in. He has been undergoing dialysis for about 6 years.   In the ER< he was found to have  hypertensive urgency at 210/120 and some tachycardia to 110. He was also noted to have potassium of 7.2 with EKG changes of peaked T waves. He was given some calcium gluconate, and some D50 and insulin. I have ordered a repeat renal function panel, and repeat EKG.  I have consulted nephrology, given that he has missed his dialysis in 2 weeks for urgent dialysis and spoken to Dr Marval Regal.  FH: diabetes, hypertension  H: drinks occasional alcohol- last drink was 2 days ago, and uses cocaine occasionally and last use was 2 weeks ago. Smokes 5 cigs per day  Meds:  Current Meds  Medication Sig  . albuterol (PROVENTIL HFA;VENTOLIN HFA) 108 (90 Base) MCG/ACT inhaler Inhale 2 puffs into the lungs every 6 (six) hours as needed for wheezing or shortness of breath.  [name] amLODipine (NORVASC) 5 MG tablet Take 5 mg by mouth daily.  . calcitRIOL (ROCALTROL) 0.25 MCG capsule Take 7 capsules (1.75 mcg total) by mouth every Monday, Wednesday, and Friday with hemodialysis.  [name] cinacalcet (SENSIPAR) 30 MG tablet Take 1 tablet (30 mg total) by mouth daily with supper.  . cyclobenzaprine (FLEXERIL) 5 MG tablet Take 1 tablet (5 mg total) by mouth 3 (three) times daily as needed (muscle soreness).  [name] docusate sodium (COLACE)  100 MG capsule Take 1 capsule (100 mg total) 2 (two) times daily by mouth.  . folic acid (FOLVITE) 1 MG tablet Take 1 tablet (1 mg total) by mouth daily.  [name] gabapentin (NEURONTIN) 300 MG capsule Take 300 mg by mouth at bedtime as needed (sleep).  . metoprolol succinate (TOPROL-XL) 25 MG 24 hr tablet Take 2 tablets (50 mg total) by mouth daily.  . Multiple Vitamin (MULTIVITAMIN WITH MINERALS) TABS tablet Take 1 tablet by mouth daily.  . naproxen (NAPROSYN) 250 MG tablet Take 1 tablet (250 mg total) by mouth 2 (two) times daily.  [name] omeprazole (PRILOSEC) 20 MG capsule Take 1 capsule (20 mg total) by mouth daily.  [name] oxyCODONE (OXY IR/ROXICODONE) 5 MG immediate release tablet Take 1 tablet (5 mg total)  by mouth every 6 (six) hours as needed for severe pain.  . predniSONE (DELTASONE) 20 MG tablet Take 2 tablets (40 mg total) by mouth daily.  . sevelamer (RENAGEL) 800 MG tablet Take 800 mg by mouth 3 (three) times daily with meals.  . thiamine (VITAMIN B-1) 100 MG tablet Take 100 mg by mouth 3 (three) times a week.   . traMADol (ULTRAM) 50 MG tablet Take 1 tablet (50 mg total) by mouth every 6 (six) hours as needed. (Patient taking differently: Take 50 mg by mouth every 6 (six) hours as needed for moderate pain. )     Allergies: Allergies as of 06/20/2017  . (No Known Allergies)   Past Medical History:  Diagnosis Date  . Anemia   . Dyspnea    "with too much fluid"  . ESRD (end stage renal disease) on dialysis [facility])    "MWF; [facility]" (12/05/2016)  . Hepatitis C    Still positive s/p liver biopsy at [facility]  and interferon therapy for 6 months. Most recent lab work was on 10/24/12  . Hepatitis C    "took the tx; gone now" (12/05/2016)  Was treated  . History of blood transfusion ~ 2012/2013   "related to my kidneys; blood was low"  . Hypertension   . Substance abuse ([place])   . Thyroid disease      Review of Systems: A complete ROS was negative except as per HPI.   Physical Exam: Blood pressure (!) 194/120, pulse (!) 105, temperature 98.7 F (37.1 C), resp. rate (!) 21, height 5\' 8"  (1.727 m), weight 160 lb (72.6 kg), SpO2 99 %.  General:  A&O, in no acute distress, breathing comfortably on 3 L oxygen  HENT: Normocephalic, atraumatic, , MMM  Eyes: PERRLA, no conjuntival pallor  Neck: supple, no lymphadenopathy CV: slightly tachycardic to 105, regular rhythm, no murmurs or rubs appreciated. Resp: equal and symmetric breath sounds,  Faint end expiratory wheezing bilaterally at bases Abdomen: soft, nontender, nondistended, +BS in all 4 quadrants Skin: warm, dry, intact, no open lesions Extremities: pulses intact b/l, trace pitting edema                      LUE has palpable  thrill                 Lipoma on the left upper back  Neurologic: Patient is alert and oriented x3, and no gross deficits noted    EKG: personally reviewed my interpretation is T wave abnormalities in anterolateral leads   CXR: personally reviewed my interpretation is some pulmonary edema   Assessment & Plan by Problem: Active Problems:   Dyspnea  Dyspnea:  Most likely  etiology is due to missing dialysis for 2 weeks due to his existing end stage renal disease. He does have COPD but I do not believe he is in COPD exacerbation as he only has one of the three cardinal features of COPD exacerbation- namely increased dyspnea, but no increased sputum volume and purulence, so am not treating for COPD exacerbation. Dyspnea could also be due to cardiac etiology like MI  but Patient denies any chest pain on exertion prior to these two weeks. Prior echo in May 2018 was EF of 55-60% with normal wall motion, and moderate left ventricular hypertrophy (so some diastolic dysfunction). He does not seem to be in heart failure exacerbation. He does have substance abuse history but he last used cocaine 2 weeks ago, and he last used alcohol- 1 glass of gin 2 days ago. Unlikely to be PE- just had a CT angio on Dec 3rd which was negative. CXR today shows some pulmonary edema.  Unlikely to be infectious etiology like pneumonia.   -consulted renal, and appreciate their prompt dialysis soon -trending troponins- 1st one is 0.03  -PRN albuterol  -currently on 3 L oxygen, but his oxygen requirement should decrease after dialysis    Severe hyperkalemia with EKG changes in the setting of ESRD and missed dialysis for 2 weeks : K of 7.2 in the ER this morning. He received 1 g of calcium gluconate, some D50 and insulin. EKG had some peaked T waves prior to giving these. Have ordered repeat renal function panel, and repeat ekg to ensure K has come down appropriately. But he will need to be dialyzed   -repeat renal function  panel -serial EKGs until dialysis -on telemetry   Atypical chest pain with some mild troponin elevation: likely due to demand ischemia due to volume overload.  EKg does not reveal any st changes- but does have peaked t waves due to hyperkalemia. Initial troponin I is 0.03.   -trending troponins -nitro SL PRN for chest pain   Hypertensive urgency: Patient presented with blood pressure around 210/120. He has not taken most of his home medications including the amlodipine and metoprolol in over a week. Likely the volume overload due to missed dialysis is contributing to his hypertensive urgency. He has some headache, and has blurry vision at baseline, so it is not new.  Would like his blood pressure to decrease by no more than 25% of his presenting value so goal is SBP of 160 over the next 24 hours.   -restarted amlodipine 5 mg daily, and half of his metoprolol dose at 25 mg 24 hour tablet -dialysis now -goal blood pressure systolic is 865 over the next 24 hours    ESRD on MWF: has missed dialysis for 2 weeks. His estimated dry weight is around 38 Kgs, and he goes to [facility]  -consulted renal and appreciate recs -dialysis now  - will need to discuss goals of care, as pt seems to be missing dialysis over a long term, and need to discuss prognosis. He would like to be full code    Substance abuse history- uses cocaine, 2 weeks ago use. He does not make urine so UDS will not help.   Anaemia: likely multifactorial due to renal disease, and also some macrocytosis noted. Has some mild thrombocytopaenia but it seems to be chronic. HgB of 8.8 seems to be around his baseline of 9-10. Does use alcohol so could be folate deficiency as well  -check vitamin B12, folate, ferritin (last  checked was in 2011),, iron and TIBC and reticulocytes  -save smear  -cbc tomorrow  -darbopoeitin per renal recs    F E N renal/CM with fluid restriction  DVT ppx: heparin sq  Code-  full  Dispo: Admit patient to Observation with expected length of stay less than 2 midnights.  Signed: Burgess Estelle, MD 06/20/2017, 10:16 AM

## 2017-06-20 NOTE — ED Notes (Signed)
Patient transported to X-ray 

## 2017-06-20 NOTE — ED Notes (Signed)
ED Provider at bedside. 

## 2017-06-20 NOTE — ED Notes (Signed)
Dr. Lita Mains advised pt may have ice per his request

## 2017-06-20 NOTE — ED Notes (Signed)
Dr. Tiburcio Pea contacted this RN and wants to try giving patient tramadol to reduce chest pain. MD advised this RN to reassess pts chest pain after pain meds given to determine if patient can still remain under telemetry bed request.

## 2017-06-20 NOTE — ED Provider Notes (Signed)
[place] EMERGENCY DEPARTMENT Provider Note   CSN: [id number] Arrival date & time: 06/20/17  0707     History   Chief Complaint Chief Complaint  Patient presents with  . Shortness of Breath  . Chest Pain    HPI Raymond [name] is a 61 y.o. male.  HPI Patient with history of COPD and end-stage renal disease on hemodialysis Monday/Wednesday/Friday presents with worsening shortness of breath.  States he was last dialyzed 2 weeks ago.  He complains of productive cough, subjective fevers and chills and sharp chest pain that is worse with deep inspiration. Past Medical History:  Diagnosis Date  . Anemia   . Dyspnea    "with too much fluid"  . ESRD (end stage renal disease) on dialysis [facility])    "MWF; [facility]" (12/05/2016)  . Hepatitis C    Still positive s/p liver biopsy at [facility]  and interferon therapy for 6 months. Most recent lab work was on 10/24/12  . Hepatitis C    "took the tx; gone now" (12/05/2016)  Was treated  . History of blood transfusion ~ 2012/2013   "related to my kidneys; blood was low"  . Hypertension   . Substance abuse ([place])   . Thyroid disease     Patient Active Problem List   Diagnosis Date Noted  . Acute bronchitis   . COPD exacerbation ([place])   . End-stage renal disease on hemodialysis ([place])   . Hypoxia 12/05/2016  . Hyperkalemia 12/19/2015  . Hypoglycemia 12/19/2015  . Polysubstance abuse ([place]) 12/19/2015  . Homelessness 12/19/2015  . Anemia associated with stage 5 chronic renal failure ([place]) 12/19/2015  . Chest pain 12/19/2013  . Pain in limb 12/04/2012  . End stage renal disease ([place]) 12/04/2012  . HEPATITIS C 09/07/2006  . HYPERCHOLESTEROLEMIA 09/07/2006  . HYPERTENSION, BENIGN SYSTEMIC 09/07/2006    Past Surgical History:  Procedure Laterality Date  . AV FISTULA PLACEMENT Left Aug. 2013 ?  [name] LIVER BIOPSY    . ORIF ULNAR FRACTURE Left 05/18/2017   Procedure: OPEN REDUCTION INTERNAL FIXATION (ORIF) ULNAR FRACTURE;   Surgeon: Iran Planas, MD;  Location: [facility];  Service: Orthopedics;  Laterality: Left;  . SHUNTOGRAM N/A 12/11/2012   Procedure: Earney Mallet;  Surgeon: Serafina Mitchell, MD;  Location[facility];  Service: Cardiovascular;  Laterality: N/A;       Home Medications    Prior to Admission medications   Medication Sig Start Date End Date Taking? Authorizing Provider  albuterol (PROVENTIL HFA;VENTOLIN HFA) 108 (90 Base) MCG/ACT inhaler Inhale 2 puffs into the lungs every 6 (six) hours as needed for wheezing or shortness of breath. 12/06/16  Yes Lorella Nimrod, MD  amLODipine (NORVASC) 5 MG tablet Take 5 mg by mouth daily.   Yes [provider]  calcitRIOL (ROCALTROL) 0.25 MCG capsule Take 7 capsules (1.75 mcg total) by mouth every Monday, Wednesday, and Friday with hemodialysis. 12/07/16  Yes Lorella Nimrod, MD  cinacalcet (SENSIPAR) 30 MG tablet Take 1 tablet (30 mg total) by mouth daily with supper. 12/21/15  Yes Arrien, Jimmy Picket, MD  cyclobenzaprine (FLEXERIL) 5 MG tablet Take 1 tablet (5 mg total) by mouth 3 (three) times daily as needed (muscle soreness). 04/09/17  Yes Rolland Porter, MD  docusate sodium (COLACE) 100 MG capsule Take 1 capsule (100 mg total) 2 (two) times daily by mouth. 05/18/17  Yes Iran Planas, MD  folic acid (FOLVITE) 1 MG tablet Take 1 tablet (1 mg total) by mouth daily. 12/07/16  Yes Lorella Nimrod, MD  gabapentin (NEURONTIN) 300 MG capsule Take 300 mg by mouth at bedtime as needed (sleep).   Yes [provider]  metoprolol succinate (TOPROL-XL) 25 MG 24 hr tablet Take 2 tablets (50 mg total) by mouth daily. 12/21/15  Yes Arrien, Jimmy Picket, MD  Multiple Vitamin (MULTIVITAMIN WITH MINERALS) TABS tablet Take 1 tablet by mouth daily. 12/07/16  Yes Lorella Nimrod, MD  naproxen (NAPROSYN) 250 MG tablet Take 1 tablet (250 mg total) by mouth 2 (two) times daily. 04/09/17  Yes Rolland Porter, MD  omeprazole (PRILOSEC) 20 MG capsule Take 1 capsule (20 mg total) by mouth  daily. 04/29/17  Yes Khatri, Hina, PA-C  oxyCODONE (OXY IR/ROXICODONE) 5 MG immediate release tablet Take 1 tablet (5 mg total) by mouth every 6 (six) hours as needed for severe pain. 03/29/17  Yes Ward, Ozella Almond, PA-C  predniSONE (DELTASONE) 20 MG tablet Take 2 tablets (40 mg total) by mouth daily. 06/12/17  Yes Blanchie Dessert, MD  sevelamer (RENAGEL) 800 MG tablet Take 800 mg by mouth 3 (three) times daily with meals.   Yes [provider]  thiamine (VITAMIN B-1) 100 MG tablet Take 100 mg by mouth 3 (three) times a week.    Yes [provider]  traMADol (ULTRAM) 50 MG tablet Take 1 tablet (50 mg total) by mouth every 6 (six) hours as needed. Patient taking differently: Take 50 mg by mouth every 6 (six) hours as needed for moderate pain.  19/50/93  Yes Delora Fuel, MD    Family History Family History  Problem Relation Age of Onset  . Diabetes Father   . Hypertension Father     Social History Social History   Tobacco Use  . Smoking status: Former Smoker    Packs/day: 1.00    Years: 25.00    Pack years: 25.00    Types: Cigarettes    Last attempt to quit: 08/01/2011    Years since quitting: 5.8  . Smokeless tobacco: Never Used  Substance Use Topics  . Alcohol use: Yes  . Drug use: Yes    Types: Cocaine    Comment: 05/17/17- cocaine 2 -3 weeks ago- "Now I go to Alcohol and Drug  classes- thinking for a change""     Allergies   Patient has no known allergies.   Review of Systems Review of Systems  Constitutional: Positive for chills and fever.  HENT: Negative for congestion.   Eyes: Negative for visual disturbance.  Respiratory: Positive for cough, shortness of breath and wheezing.   Cardiovascular: Positive for chest pain.  Gastrointestinal: Negative for abdominal pain, nausea and vomiting.  Musculoskeletal: Negative for arthralgias, myalgias, neck pain and neck stiffness.  Skin: Negative for rash and wound.  Neurological: Negative for dizziness,  weakness, light-headedness, numbness and headaches.  All other systems reviewed and are negative.    Physical Exam Updated Vital Signs BP (!) 221/127   Pulse (!) 111   Temp 98.7 F (37.1 C)   Resp 20   Ht 5\' 8"  (1.727 m)   Wt 72.6 kg (160 lb)   SpO2 98%   BMI 24.33 kg/m   Physical Exam  Constitutional: He is oriented to person, place, and time. He appears well-developed and well-nourished.  HENT:  Head: Normocephalic and atraumatic.  Mouth/Throat: Oropharynx is clear and moist. No oropharyngeal exudate.  Eyes: EOM are normal. Pupils are equal, round, and reactive to light.  Neck: Normal range of motion. Neck supple. JVD present.  Cardiovascular: Normal  rate and regular rhythm. Exam reveals no gallop and no friction rub.  No murmur heard. Pulmonary/Chest: He has wheezes. He has rales.  Increased respiratory effort.  Patient with few scattered diffuse expiratory wheezes.  Crackles in bilateral bases.  Abdominal: Soft. Bowel sounds are normal. There is no tenderness. There is no rebound and no guarding.  Musculoskeletal: Normal range of motion. He exhibits no edema or tenderness.  Palpable thrill in left upper extremity AV fistula.  Neurological: He is alert and oriented to person, place, and time.  Moves all extremities without deficit.  Sensation intact.  Skin: Skin is warm and dry. Capillary refill takes less than 2 seconds. No rash noted. He is not diaphoretic. No erythema.  Psychiatric: He has a normal mood and affect. His behavior is normal.  Nursing note and vitals reviewed.    ED Treatments / Results  Labs (all labs ordered are listed, but only abnormal results are displayed) Labs Reviewed  CBC WITH DIFFERENTIAL/PLATELET - Abnormal; Notable for the following components:      Result Value   WBC 3.6 (*)    RBC 2.66 (*)    Hemoglobin 8.8 (*)    HCT 27.7 (*)    MCV 104.1 (*)    RDW 17.1 (*)    Platelets 118 (*)    Lymphs Abs 0.5 (*)    All other components  within normal limits  COMPREHENSIVE METABOLIC PANEL - Abnormal; Notable for the following components:   Potassium 7.2 (*)    CO2 18 (*)    Glucose, Bld 106 (*)    BUN 109 (*)    Creatinine, Ser 24.03 (*)    Albumin 3.2 (*)    ALT 16 (*)    GFR calc non Af Amer 2 (*)    GFR calc Af Amer 2 (*)    Anion gap 20 (*)    All other components within normal limits  TROPONIN I - Abnormal; Notable for the following components:   Troponin I 0.03 (*)    All other components within normal limits    EKG  EKG Interpretation  Date/Time:  Tuesday June 20 2017 07:15:01 EST Ventricular Rate:  92 PR Interval:    QRS Duration: 97 QT Interval:  370 QTC Calculation: 458 R Axis:   35 Text Interpretation:  Sinus rhythm Nonspecific T abnrm, anterolateral leads Confirmed by Julianne Rice [phone]) on 06/20/2017 7:28:08 AM       Radiology Dg Chest 2 View  Result Date: 06/20/2017 CLINICAL DATA:  Chest pain and shortness of breath. EXAM: CHEST  2 VIEW COMPARISON:  Chest x-rays dated 06/11/2017 and 04/09/2017 and chest CT dated 06/12/2017 FINDINGS: There are more prominent Kerley B-lines at both lung bases. Slight new blunting of the posterior costophrenic angles consistent with tiny effusions. Heart size appears slightly more prominent but the vascularity remains within normal limits. Aortic atherosclerosis. No acute bone abnormality. IMPRESSION: Progressive mild pulmonary edema. Aortic atherosclerosis. Electronically Signed   By: Lorriane Shire M.D.   On: 06/20/2017 07:51    Procedures Procedures (including critical care time)  Medications Ordered in ED Medications  calcium gluconate 1 g in sodium chloride 0.9 % 100 mL IVPB (0 g Intravenous Stopped 06/20/17 0845)  dextrose 50 % solution 50 mL (50 mLs Intravenous Given 06/20/17 0858)  insulin aspart (novoLOG) injection 5 Units (5 Units Intravenous Given 06/20/17 0858)   CRITICAL CARE Performed by: Julianne Rice Total critical care time: 25  minutes Critical care time was exclusive of separately  billable procedures and treating other patients. Critical care was necessary to treat or prevent imminent or life-threatening deterioration. Critical care was time spent personally by me on the following activities: development of treatment plan with patient and/or surrogate as well as nursing, discussions with consultants, evaluation of patient's response to treatment, examination of patient, obtaining history from patient or surrogate, ordering and performing treatments and interventions, ordering and review of laboratory studies, ordering and review of radiographic studies, pulse oximetry and re-evaluation of patient's condition.  Initial Impression / Assessment and Plan / ED Course  I have reviewed the triage vital signs and the nursing notes.  Pertinent labs & imaging results that were available during my care of the patient were reviewed by me and considered in my medical decision making (see chart for details).     Patient has some peak T waves on his EKG.  We will go ahead and give calcium gluconate.  Patient will likely need emergent dialysis.   Given 5 units of insulin and an amp of D50.  Discussed with Dr. Larita Fife.  Will arrange dialysis. Internal medicine service to admit.  Final Clinical Impressions(s) / ED Diagnoses   Final diagnoses:  Hyperkalemia  Acute pulmonary edema ([place])  Atypical chest pain    ED Discharge Orders    None       Julianne Rice, MD 06/20/17 [phone]

## 2017-06-20 NOTE — ED Triage Notes (Signed)
Pt BIB [facility] for shortness of breath and chest pain. Pt is a dialysis patient that normally goes MWF and has missed the last 3 treatments.

## 2017-06-20 NOTE — ED Notes (Signed)
Patient on 2L O2 for comfort.

## 2017-06-20 NOTE — ED Notes (Signed)
3E refusing to take pt due to elevated potassium, stating pt is more appropriate for renal floor. This RN contacting bed placement for proper department placement

## 2017-06-21 DIAGNOSIS — Z992 Dependence on renal dialysis: Secondary | ICD-10-CM

## 2017-06-21 DIAGNOSIS — J810 Acute pulmonary edema: Secondary | ICD-10-CM

## 2017-06-21 DIAGNOSIS — Z8619 Personal history of other infectious and parasitic diseases: Secondary | ICD-10-CM

## 2017-06-21 DIAGNOSIS — K5900 Constipation, unspecified: Secondary | ICD-10-CM

## 2017-06-21 DIAGNOSIS — I248 Other forms of acute ischemic heart disease: Secondary | ICD-10-CM | POA: Diagnosis present

## 2017-06-21 DIAGNOSIS — F1990 Other psychoactive substance use, unspecified, uncomplicated: Secondary | ICD-10-CM

## 2017-06-21 DIAGNOSIS — N186 End stage renal disease: Secondary | ICD-10-CM | POA: Diagnosis not present

## 2017-06-21 DIAGNOSIS — D631 Anemia in chronic kidney disease: Secondary | ICD-10-CM | POA: Diagnosis present

## 2017-06-21 DIAGNOSIS — Z590 Homelessness: Secondary | ICD-10-CM | POA: Diagnosis not present

## 2017-06-21 DIAGNOSIS — F17210 Nicotine dependence, cigarettes, uncomplicated: Secondary | ICD-10-CM | POA: Diagnosis present

## 2017-06-21 DIAGNOSIS — E875 Hyperkalemia: Secondary | ICD-10-CM | POA: Diagnosis present

## 2017-06-21 DIAGNOSIS — I160 Hypertensive urgency: Secondary | ICD-10-CM

## 2017-06-21 DIAGNOSIS — E8770 Fluid overload, unspecified: Secondary | ICD-10-CM | POA: Diagnosis present

## 2017-06-21 DIAGNOSIS — Z8249 Family history of ischemic heart disease and other diseases of the circulatory system: Secondary | ICD-10-CM | POA: Diagnosis not present

## 2017-06-21 DIAGNOSIS — Z79899 Other long term (current) drug therapy: Secondary | ICD-10-CM | POA: Diagnosis not present

## 2017-06-21 DIAGNOSIS — D649 Anemia, unspecified: Secondary | ICD-10-CM

## 2017-06-21 DIAGNOSIS — R0600 Dyspnea, unspecified: Secondary | ICD-10-CM | POA: Diagnosis not present

## 2017-06-21 DIAGNOSIS — I120 Hypertensive chronic kidney disease with stage 5 chronic kidney disease or end stage renal disease: Secondary | ICD-10-CM

## 2017-06-21 DIAGNOSIS — Z79891 Long term (current) use of opiate analgesic: Secondary | ICD-10-CM | POA: Diagnosis not present

## 2017-06-21 DIAGNOSIS — R109 Unspecified abdominal pain: Secondary | ICD-10-CM | POA: Diagnosis not present

## 2017-06-21 DIAGNOSIS — R011 Cardiac murmur, unspecified: Secondary | ICD-10-CM | POA: Diagnosis not present

## 2017-06-21 DIAGNOSIS — J449 Chronic obstructive pulmonary disease, unspecified: Secondary | ICD-10-CM

## 2017-06-21 DIAGNOSIS — Z7951 Long term (current) use of inhaled steroids: Secondary | ICD-10-CM | POA: Diagnosis not present

## 2017-06-21 DIAGNOSIS — F1410 Cocaine abuse, uncomplicated: Secondary | ICD-10-CM | POA: Diagnosis present

## 2017-06-21 DIAGNOSIS — R0602 Shortness of breath: Secondary | ICD-10-CM | POA: Diagnosis not present

## 2017-06-21 DIAGNOSIS — Z9115 Patient's noncompliance with renal dialysis: Secondary | ICD-10-CM | POA: Diagnosis not present

## 2017-06-21 DIAGNOSIS — I1311 Hypertensive heart and chronic kidney disease without heart failure, with stage 5 chronic kidney disease, or end stage renal disease: Secondary | ICD-10-CM | POA: Diagnosis present

## 2017-06-21 DIAGNOSIS — Z9114 Patient's other noncompliance with medication regimen: Secondary | ICD-10-CM | POA: Diagnosis not present

## 2017-06-21 LAB — CBC
HCT: 27.9 % — ABNORMAL LOW (ref 39.0–52.0)
HEMOGLOBIN: 8.7 g/dL — AB (ref 13.0–17.0)
MCH: 31.9 pg (ref 26.0–34.0)
MCHC: 31.2 g/dL (ref 30.0–36.0)
MCV: 102.2 fL — ABNORMAL HIGH (ref 78.0–100.0)
PLATELETS: 140 10*3/uL — AB (ref 150–400)
RBC: 2.73 MIL/uL — ABNORMAL LOW (ref 4.22–5.81)
RDW: 17.1 % — AB (ref 11.5–15.5)
WBC: 3.4 10*3/uL — ABNORMAL LOW (ref 4.0–10.5)

## 2017-06-21 LAB — COMPREHENSIVE METABOLIC PANEL
ALBUMIN: 2.8 g/dL — AB (ref 3.5–5.0)
ALT: 12 U/L — ABNORMAL LOW (ref 17–63)
AST: 26 U/L (ref 15–41)
Alkaline Phosphatase: 75 U/L (ref 38–126)
Anion gap: 13 (ref 5–15)
BUN: 44 mg/dL — AB (ref 6–20)
CHLORIDE: 97 mmol/L — AB (ref 101–111)
CO2: 27 mmol/L (ref 22–32)
Calcium: 8.3 mg/dL — ABNORMAL LOW (ref 8.9–10.3)
Creatinine, Ser: 11.27 mg/dL — ABNORMAL HIGH (ref 0.61–1.24)
GFR calc Af Amer: 5 mL/min — ABNORMAL LOW (ref >60)
GFR, EST NON AFRICAN AMERICAN: 4 mL/min — AB (ref >60)
GLUCOSE: 100 mg/dL — AB (ref 65–99)
POTASSIUM: 4.4 mmol/L (ref 3.5–5.1)
Sodium: 137 mmol/L (ref 135–145)
Total Bilirubin: 0.5 mg/dL (ref 0.3–1.2)
Total Protein: 6.7 g/dL (ref 6.5–8.1)

## 2017-06-21 LAB — GLUCOSE, CAPILLARY
GLUCOSE-CAPILLARY: 74 mg/dL (ref 65–99)
GLUCOSE-CAPILLARY: 83 mg/dL (ref 65–99)
GLUCOSE-CAPILLARY: 89 mg/dL (ref 65–99)
Glucose-Capillary: 119 mg/dL — ABNORMAL HIGH (ref 65–99)
Glucose-Capillary: 83 mg/dL (ref 65–99)

## 2017-06-21 LAB — RENAL FUNCTION PANEL
ALBUMIN: 2.7 g/dL — AB (ref 3.5–5.0)
ANION GAP: 12 (ref 5–15)
BUN: 45 mg/dL — ABNORMAL HIGH (ref 6–20)
CALCIUM: 8.2 mg/dL — AB (ref 8.9–10.3)
CO2: 28 mmol/L (ref 22–32)
CREATININE: 11.39 mg/dL — AB (ref 0.61–1.24)
Chloride: 96 mmol/L — ABNORMAL LOW (ref 101–111)
GFR calc Af Amer: 5 mL/min — ABNORMAL LOW (ref >60)
GFR calc non Af Amer: 4 mL/min — ABNORMAL LOW (ref >60)
GLUCOSE: 96 mg/dL (ref 65–99)
PHOSPHORUS: 7.4 mg/dL — AB (ref 2.5–4.6)
Potassium: 4.3 mmol/L (ref 3.5–5.1)
SODIUM: 136 mmol/L (ref 135–145)

## 2017-06-21 LAB — FERRITIN: FERRITIN: 76 ng/mL (ref 24–336)

## 2017-06-21 LAB — TROPONIN I: TROPONIN I: 0.04 ng/mL — AB (ref <0.03)

## 2017-06-21 MED ORDER — SENNOSIDES-DOCUSATE SODIUM 8.6-50 MG PO TABS
1.0000 | ORAL_TABLET | Freq: Every evening | ORAL | Status: DC | PRN
  Administered 2017-06-21: 1 via ORAL
  Filled 2017-06-21: qty 1

## 2017-06-21 MED ORDER — METOPROLOL SUCCINATE ER 50 MG PO TB24
50.0000 mg | ORAL_TABLET | Freq: Every day | ORAL | Status: DC
  Administered 2017-06-21 – 2017-06-22 (×2): 50 mg via ORAL
  Filled 2017-06-21 (×2): qty 1

## 2017-06-21 MED ORDER — SIMETHICONE 80 MG PO CHEW
80.0000 mg | CHEWABLE_TABLET | Freq: Four times a day (QID) | ORAL | Status: DC | PRN
  Administered 2017-06-21: 80 mg via ORAL
  Filled 2017-06-21: qty 1

## 2017-06-21 NOTE — Progress Notes (Addendum)
Subjective:  Raymond [name] was lying in bed comfortably this morning. Denies chest pain or shortness of breath. Endorses abdominal pain. No nausea/vomiting this morning, but did have an episode of emesis yesterday during dialysis. Endorses constipation, no diarrhea. Tolerating PO intake.  Notes that it has been very difficult to stop the bleeding after access with dialysis. States it happened yesterday with dialysis and has happened at prior outpatient sessions.  Objective:  Vital signs in last 24 hours: Vitals:   06/21/17 0400 06/21/17 0500 06/21/17 0600 06/21/17 0754  BP: 137/87 (!) 177/110 (!) 164/100 136/77  Pulse: 77 78 81 78  Resp: 17 (!) 24 19 16   Temp:    98.7 F (37.1 C)  TempSrc:    Oral  SpO2: 96% 96% 96% 96%  Weight:      Height:       GEN: Well-appearing, lying in bed comfortably in NAD. Alert and oriented. RESP: Clear to auscultation bilaterally. No wheezes, rales, or rhonchi. No increased work of breathing. CV: Normal rate and regular rhythm. No murmurs, gallops, or rubs. No LE edema. ABD: Soft. Mildly tender to palpation diffusely. Non-distended. Normoactive bowel sounds. EXT: No edema. Warm and well perfused. NEURO: Cranial nerves II-XII grossly intact. Able to lift all four extremities against gravity. No apparent audiovisual hallucinations. Speech fluent and appropriate. PSYCH: Patient is calm and pleasant. Appropriate affect. Well-groomed; speech is appropriate and on-subject.  Labs CBC Latest Ref Rng & Units 06/21/2017 06/20/2017 06/11/2017  WBC 4.0 - 10.5 K/uL 3.4(L) 3.6(L) -  Hemoglobin 13.0 - 17.0 g/dL 8.7(L) 8.8(L) 9.2(L)  Hematocrit 39.0 - 52.0 % 27.9(L) 27.7(L) 27.0(L)  Platelets 150 - 400 K/uL 140(L) 118(L) -   CMP Latest Ref Rng & Units 06/21/2017 06/21/2017 06/20/2017  Glucose 65 - 99 mg/dL 100(H) 96 106(H)  BUN 6 - 20 mg/dL 44(H) 45(H) 109(H)  Creatinine 0.61 - 1.24 mg/dL 11.27(H) 11.39(H) 24.03(H)  Sodium 135 - 145 mmol/L 137 136 142  Potassium  3.5 - 5.1 mmol/L 4.4 4.3 7.2(HH)  Chloride 101 - 111 mmol/L 97(L) 96(L) 104  CO2 22 - 32 mmol/L 27 28 18(L)  Calcium 8.9 - 10.3 mg/dL 8.3(L) 8.2(L) 8.9  Total Protein 6.5 - 8.1 g/dL 6.7 - 7.6  Total Bilirubin 0.3 - 1.2 mg/dL 0.5 - 0.6  Alkaline Phos 38 - 126 U/L 75 - 78  AST 15 - 41 U/L 26 - 41  ALT 17 - 63 U/L 12(L) - 16(L)   Ferritin 76  Assessment/Plan:  Active Problems:   Dyspnea  Raymond [name] is a 61 yo male with ESRD on HD MWF, COPD, HTN, substance use disorder, and treated HCV who presents with 2 days of dyspnea and chest pain in the setting of not getting dialysis for the last 2 weeks.  Abdominal pain New since yesterday. Had an episode of emesis yesterday with dialysis. No N/V this morning. May be secondary to constipation? - Continue to monitor - Senna PRN  Dyspnea and chest pain, in the setting of no dialysis for 2 weeks Resolved this morning after dialysis yesterday. Likely secondary to volume overload secondary to missing dialysis. Less likely COPD exacerbation, pneumonia, or PE. Troponins peaked at 0.05, likely due to demand ischemia/renal disease. VSS. Afebrile. O2 sats stable on RA.  - PRN albuterol - Continue to monitor  Hyperkalemia (7.2) with EKG changes, in the setting of ESRD and missed dialysis for 2 weeks Resolved after calcium gluconate, D50 + insulin, and HD yesterday. K 4.4 this morning. - Continue  to monitor  Hypertensive urgency BP 210/120 on admission, improved this morning to 160/99. Was not taking home meds, including amlodipine and metoprolol, for a week. - Continue home amlodipine 5mg  daily - Increased metoprolol to home dose of 50mg  daily  ESRD on HD MWF Missed dialysis for 2 weeks due to frustration with dialysis, however now realizes that this is something "he has to deal with". Has been undergoing dialysis for ~6 years. Received dialysis yesterday. - Nephrology consulted; appreciate their assistance - Likely discharge today pending  Nephrology recs  Anemia Likely secondary to renal disease/alcohol use. Hb 8.8, around baseline of 9-10. Anemia work-up unremarkable.  Dispo: Anticipated discharge in approximately 0-1 day(s).  Colbert Ewing, MD 06/21/2017, 8:34 AM Pager: Mamie Nick [phone]

## 2017-06-21 NOTE — Progress Notes (Signed)
  Date: 06/21/2017  Patient name: Raymond [name]  Medical record number: [id number]  Date of birth: [birth year]   I have seen and evaluated Raymond [name] and discussed their care with the Residency Team. Briefly, Raymond [name] is a 61yo  Man with PMH of ESRD on HD, COPD, HTN, SUD, anemia, treated HCV.  He presented with dyspnea, dry cough and chest pain after missing HD for 2 weeks.  He reported that he felt like he couldn't handle the HD anymore, but on the day of admission he realized he needed to do HD.  His K was elevated to 7.2 and TW were peaked on initial EKG.  His BP was elevated.  He was worked up for ACS with Troponin (flat X 3) and EKG for the chest pain.  His dyspnea is now resolved after HD.  He does report getting sick during HD and vomiting X 1 and since that time having some dull abdominal pain, but notes that this is mild.  He has eaten breakfast since that time.    PMHx, Fam Hx, and/or Soc Hx : He drinks occasional ETOH and occasionally uses cocaine.  He is a daily smoker.   Vitals:   06/21/17 0600 06/21/17 0754  BP: (!) 164/100 136/77  Pulse: 81 78  Resp: 19 16  Temp:  98.7 F (37.1 C)  SpO2: 96% 96%   Physical Exam:  Gen: Awake, alert, no acute distress Eyes: Muddy Sclerae, no conjunctival injection CV: RR, NR, + systolic murmur Pulm: Breathing easily on room air, some decreased breath sounds at bases, no crackles.  No wheezing Abd: ND, mild tenderness to palpation umbilical, +BS Ext: AVF in left forearm with palpable thrill, no edema Psych: Somewhat flat affect  Pertinent data  K 4.4 Cr 11.27 Alb 2.8  WBC 3.4 H/H 8.7/27.9 Plt 140  EKG 12/11: Peaked T waves in the lateral leads  CXR with progressive pulmonary edema.   Assessment and Plan: I have seen and evaluated the patient as outlined above. I agree with the formulated Assessment and Plan as detailed in the residents' note, with the following changes:   1. Dyspnea, missed HD - Likely the two are related, he  has improved with HD and now reports no further chest pain or dyspnea - Troponin flat X 3, likely chest pain related to missed HD as well.  - Telemetry  2. Hyperkalemia - Resolved with HD - Follow up nephrology recommendations to see if he needs another session, otherwise follow up with his outpatient HD provider.   3. Hypertensive urgency - Improved after HD and restarting home medications  Other issues per Dr. Allayne Gitelman daily note.  Sid Falcon, MD 12/12/201811:04 AM

## 2017-06-21 NOTE — Progress Notes (Signed)
I saw Mr Gallien, SOB has resolved, he had 4 L fluid removed and lungs are mostly clear today, resp distress resolved.  Is still 3 kg up from his dry wt.  Plan is for HD today, doubt he will be done in the daytime, have d/w primary team.  He is OK for dc after HD today/ tonight.      Usual HD orders are 4 hr EDW 71 2K 2 Ca no heparin profile 4 left lower AVF sensipar 180 with HD venofer 50 per week, Mircera 200 q 2 weeks - last 11/28 calcitriol 2.75 q HD  Kelly Splinter MD [employer] pgr [phone]   06/21/2017, 2:35 PM

## 2017-06-21 NOTE — Discharge Summary (Signed)
Name: Raymond [name] MRN: [id number] DOB: [birth year] 61 y.o. PCP: Patient, No Pcp Per  Date of Admission: 06/20/2017  7:07 AM Date of Discharge: 06/22/2017 Attending Physician: Sid Falcon, MD  Discharge Diagnosis: 1. Dypsnea 2. Hypertensive urgency 3. Hyperkalemia  Active Problems:   Dyspnea   Acute pulmonary edema [facility])   Discharge Medications: Allergies as of 06/22/2017   No Known Allergies     Medication List    TAKE these medications   albuterol 108 (90 Base) MCG/ACT inhaler Commonly known as:  PROVENTIL HFA;VENTOLIN HFA Inhale 2 puffs into the lungs every 6 (six) hours as needed for wheezing or shortness of breath.   amLODipine 5 MG tablet Commonly known as:  NORVASC Take 1 tablet (5 mg total) by mouth daily.   calcitRIOL 0.25 MCG capsule Commonly known as:  ROCALTROL Take 7 capsules (1.75 mcg total) by mouth every Monday, Wednesday, and Friday with hemodialysis.   cinacalcet 30 MG tablet Commonly known as:  SENSIPAR Take 1 tablet (30 mg total) by mouth daily with supper.   cyclobenzaprine 5 MG tablet Commonly known as:  FLEXERIL Take 1 tablet (5 mg total) by mouth 3 (three) times daily as needed (muscle soreness).   docusate sodium 100 MG capsule Commonly known as:  COLACE Take 1 capsule (100 mg total) 2 (two) times daily by mouth.   folic acid 1 MG tablet Commonly known as:  FOLVITE Take 1 tablet (1 mg total) by mouth daily.   gabapentin 300 MG capsule Commonly known as:  NEURONTIN Take 300 mg by mouth at bedtime as needed (sleep).   metoprolol succinate 25 MG 24 hr tablet Commonly known as:  TOPROL-XL Take 2 tablets (50 mg total) by mouth daily.   multivitamin with minerals Tabs tablet Take 1 tablet by mouth daily.   naproxen 250 MG tablet Commonly known as:  NAPROSYN Take 1 tablet (250 mg total) by mouth 2 (two) times daily.   omeprazole 20 MG capsule Commonly known as:  PRILOSEC Take 1 capsule (20 mg total) by mouth daily.     oxyCODONE 5 MG immediate release tablet Commonly known as:  Oxy IR/ROXICODONE Take 1 tablet (5 mg total) by mouth every 6 (six) hours as needed for severe pain.   predniSONE 20 MG tablet Commonly known as:  DELTASONE Take 2 tablets (40 mg total) by mouth daily.   sevelamer 800 MG tablet Commonly known as:  RENAGEL Take 800 mg by mouth 3 (three) times daily with meals.   thiamine 100 MG tablet Commonly known as:  VITAMIN B-1 Take 100 mg by mouth 3 (three) times a week.   traMADol 50 MG tablet Commonly known as:  ULTRAM Take 1 tablet (50 mg total) by mouth every 6 (six) hours as needed. What changed:  reasons to take this       Disposition and follow-up:   [name] was discharged from [facility] in Stable condition.  At the hospital follow up visit please address:  1.  - Has he been able to make it to dialysis as scheduled? - Dyspnea likely related to volume overload secondary to missing dialysis. Any more episodes of dyspnea or chest pain?  2.  Labs / imaging needed at time of follow-up: None  3.  Pending labs/ test needing follow-up: None  Follow-up Appointments: Follow-up Information    [place]. Call in 1 week(s).   Why:  to make an appointment to establish care  with a primary care doctor. Contact information: [place] [id number] [place] Hospital Course by problem list: Active Problems:   Dyspnea   Acute pulmonary edema ([place])   1. Dypsnea, in setting of ESRD with HD MWF Missed dialysis for 2 weeks. Initially presented on 12/11 with dyspnea and chest pain, felt to be due to volume overload. Troponins peaked at 0.05, likely demand ischemia/renal disease. States he did not go to dialysis because it "mentally bothers him" and he wasn't able to continue that lifestyle. However, he realized on the day of presentation that "he has to deal with this" so he  came to the ED. Has been on dialysis for ~6 years. Nephrology consulted and he received urgent dialysis with improvement in his symptoms. He received dialysis again the next day and was discharged home the day after to continue outpatient dialysis on his normal schedule. Social work was also consulted to assist patient with Meals on Wheels and transportation issues for getting to dialysis.  2. Hypertensive urgency BP on admission 210/120, felt to be secondary to volume overload. Received urgent dialysis with improvement in BP. Home amlodipine and metoprolol continued.  3. Hyperkalemia Potassium elevated to 7.2 on admission with EKG changes of peaked T waves. Received calcium gluconate, D50 + insulin, and urgent dialysis with improvement in potassium.  4. Abdominal pain Had not had a bowel movement in a few days. Abdominal pain likely related to constipation. Home med list does show an opioid. Can treat with laxatives as needed.  Discharge Vitals:   BP 137/84 (BP Location: Right Arm)   Pulse 74   Temp 98.5 F (36.9 C) (Oral)   Resp 19   Ht 5\' 8"  (1.727 m)   Wt 156 lb 1.4 oz (70.8 kg)   SpO2 97%   BMI 23.73 kg/m   Pertinent Labs, Studies, and Procedures:  CBC Latest Ref Rng & Units 06/21/2017 06/20/2017 06/11/2017  WBC 4.0 - 10.5 K/uL 3.4(L) 3.6(L) -  Hemoglobin 13.0 - 17.0 g/dL 8.7(L) 8.8(L) 9.2(L)  Hematocrit 39.0 - 52.0 % 27.9(L) 27.7(L) 27.0(L)  Platelets 150 - 400 K/uL 140(L) 118(L) -   CMP Latest Ref Rng & Units 06/22/2017 06/21/2017 06/21/2017  Glucose 65 - 99 mg/dL 83 100(H) 96  BUN 6 - 20 mg/dL 42(H) 44(H) 45(H)  Creatinine 0.61 - 1.24 mg/dL 9.06(H) 11.27(H) 11.39(H)  Sodium 135 - 145 mmol/L 135 137 136  Potassium 3.5 - 5.1 mmol/L 4.2 4.4 4.3  Chloride 101 - 111 mmol/L 96(L) 97(L) 96(L)  CO2 22 - 32 mmol/L 26 27 28   Calcium 8.9 - 10.3 mg/dL 7.8(L) 8.3(L) 8.2(L)  Total Protein 6.5 - 8.1 g/dL - 6.7 -  Total Bilirubin 0.3 - 1.2 mg/dL - 0.5 -  Alkaline Phos 38 - 126 U/L -  75 -  AST 15 - 41 U/L - 26 -  ALT 17 - 63 U/L - 12(L) -   Troponin 0.03 -> 0.05 -> 0.05 -> 0.04 Ferritin 76 Iron 62, TIBC 353, sat ratio 18% B12 548 Folate 7.7 Retic Ct 3.5%  Discharge Instructions: Discharge Instructions    Call MD for:  difficulty breathing, headache or visual disturbances   Complete by:  As directed    Call MD for:  extreme fatigue   Complete by:  As directed    Call MD for:  persistant dizziness or light-headedness   Complete by:  As directed    Call MD for:  persistant nausea and vomiting   Complete by:  As directed    Call MD for:  severe uncontrolled pain   Complete by:  As directed    Call MD for:  temperature >100.4   Complete by:  As directed    Diet - low sodium heart healthy   Complete by:  As directed    Increase activity slowly   Complete by:  As directed       Signed: Colbert Ewing, MD 06/22/2017, 10:58 AM   Pager: Mamie Nick [phone]

## 2017-06-21 NOTE — Progress Notes (Signed)
Pt c/o abdominal pain 5/10 unrelieved by tramadol given at 2051. Pt stated that it feels like their stomach "is full." I paged IMTS Md Tawny Asal and received orders for simethicone for bloating pain. I will give this along with PRN senakot to relieve constipation. Will continue to monitor.

## 2017-06-21 NOTE — Progress Notes (Signed)
Pt c/o constipation and stomach pain. States this has been an issue lately and last BM was a couple days ago. Spoke w/ IMTS to request order for stool softener.   Pts phos 7.4 this AM. Messaged nephrology to relay.

## 2017-06-22 LAB — RENAL FUNCTION PANEL
ANION GAP: 13 (ref 5–15)
Albumin: 2.7 g/dL — ABNORMAL LOW (ref 3.5–5.0)
BUN: 42 mg/dL — ABNORMAL HIGH (ref 6–20)
CALCIUM: 7.8 mg/dL — AB (ref 8.9–10.3)
CHLORIDE: 96 mmol/L — AB (ref 101–111)
CO2: 26 mmol/L (ref 22–32)
Creatinine, Ser: 9.06 mg/dL — ABNORMAL HIGH (ref 0.61–1.24)
GFR, EST AFRICAN AMERICAN: 6 mL/min — AB (ref >60)
GFR, EST NON AFRICAN AMERICAN: 5 mL/min — AB (ref >60)
Glucose, Bld: 83 mg/dL (ref 65–99)
PHOSPHORUS: 6.4 mg/dL — AB (ref 2.5–4.6)
Potassium: 4.2 mmol/L (ref 3.5–5.1)
SODIUM: 135 mmol/L (ref 135–145)

## 2017-06-22 LAB — GLUCOSE, CAPILLARY
GLUCOSE-CAPILLARY: 86 mg/dL (ref 65–99)
GLUCOSE-CAPILLARY: 75 mg/dL (ref 65–99)
GLUCOSE-CAPILLARY: 84 mg/dL (ref 65–99)

## 2017-06-22 MED ORDER — AMLODIPINE BESYLATE 5 MG PO TABS: 5.0000 mg | ORAL_TABLET | Freq: Every day | ORAL | 0 refills | Status: DC

## 2017-06-22 MED ORDER — METOPROLOL SUCCINATE ER 25 MG PO TB24: 50.0000 mg | ORAL_TABLET | Freq: Every day | ORAL | 0 refills | Status: DC

## 2017-06-22 NOTE — Progress Notes (Signed)
CSW spoke with pt concerning patient inability to make it to dialysis for 2 weeks.  Patient attributes this to transportation issues- states he was in new relationship and was not staying where his Medicaid transport services picks him up- did not call Medicaid to inform of new address and was unable to make it to pick up point.  Per pt that relationship is over and he is now planning to live with his sister at [address] [place], [place].  Pt states he has not given this new address to Medicaid transport yet.  CSW provided pt with Medicaid transport number and informed him he needs to call right now before office closes in order to schedule new pick up location.  Pt also with issues getting home today- nobody with transport can pick up and does not have funds for cab- cab voucher provided  Pt also inquired about referral to [facility] [place] called [employer] to make referral to [employer] Whe[employer]- they are currently not accepting any new referrals due to being capped on their funding.  No further CSW needs at this time.  Jorge Ny, LCSW Clinical Social Worker [phone]

## 2017-06-22 NOTE — Discharge Instructions (Addendum)
Please continue going to your dialysis sessions on Monday, Wednesday, Friday.  Please take your blood pressure medicines as prescribed - amlodipine and metoprolol.  Please set up care with a primary care doctor who can manage your health conditions outside of the hospital. A good option is [facility].

## 2017-06-22 NOTE — Care Management Note (Signed)
Case Management Note  Patient Details  Name: Raymond [name] MRN: [id number] Date of Birth: 1955/11/20  Subjective/Objective:   From home, presents with dyspnea, pulmonary edema, he has no PCP, NCM scheduled a follow up apt for him at the [facility] [place] 12/21 at 9:30.  NCM gave patient this information.  He is for dc today, cab waiting.                 Action/Plan:   Expected Discharge Date:  06/22/17               Expected Discharge Plan:  Home/Self Care  In-House Referral:  Clinical Social Work  Discharge planning Services     Post Acute Care Choice:    Choice offered to:     DME Arranged:    DME Agency:     HH Arranged:    [place] Agency:     Status of Service:  Completed, signed off  If discussed at [employer], dates discussed:    Additional Comments:  Zenon Mayo, RN 06/22/2017, 3:01 PM

## 2017-06-22 NOTE — Plan of Care (Signed)
Pt is cooperative and compliant with care. Appears to be progressing in all areas.

## 2017-06-22 NOTE — Progress Notes (Signed)
ADT order placed 2045 for pt status to be changed to telemetry but with no transfer orders off of the unit. I paged IMTS first pager number at [phone] with no response. I paged IMTS second pager number at [place] and received a call back at [phone]. MD advised that they would place a care order to address potential transfer off of unit in the AM.

## 2017-06-22 NOTE — Progress Notes (Signed)
Subjective:  Mr. Durr was lying in bed comfortably this morning. Denies chest pain or shortness of breath. Endorses abdominal pain that has improved since yesterday. Denies N/V. Has not had a BM yet. Has received laxatives. Tolerating PO intake.  Spoke to RN regarding outpatient resources for patient. Patient would likely benefit from Meals on Wheels and transportation services for dialysis. RN states that he likely was not going to dialysis before admission due to transportation issues. Will get social work involved.  Objective:  Vital signs in last 24 hours: Vitals:   06/22/17 0400 06/22/17 0442 06/22/17 0500 06/22/17 0600  BP: (!) 151/103 (!) 151/103 (!) 155/102 (!) 129/109  Pulse: 78 85 77 73  Resp:  17 18 (!) 23  Temp:  98.5 F (36.9 C)    TempSrc:  Oral    SpO2: 93% 92% 95% 93%  Weight:      Height:       GEN: Well-appearing, lying in bed comfortably in NAD. Alert and oriented. RESP: Clear to auscultation bilaterally. No wheezes, rales, or rhonchi. No increased work of breathing. CV: Normal rate and regular rhythm. No murmurs, gallops, or rubs. No LE edema. ABD: Soft. Mildly tender to palpation diffusely. Non-distended. Normoactive bowel sounds. EXT: No edema. Warm and well perfused. NEURO: Cranial nerves II-XII grossly intact. Able to lift all four extremities against gravity. No apparent audiovisual hallucinations. Speech fluent and appropriate. PSYCH: Patient is calm and pleasant. Appropriate affect. Well-groomed; speech is appropriate and on-subject.  Labs CBC Latest Ref Rng & Units 06/21/2017 06/20/2017 06/11/2017  WBC 4.0 - 10.5 K/uL 3.4(L) 3.6(L) -  Hemoglobin 13.0 - 17.0 g/dL 8.7(L) 8.8(L) 9.2(L)  Hematocrit 39.0 - 52.0 % 27.9(L) 27.7(L) 27.0(L)  Platelets 150 - 400 K/uL 140(L) 118(L) -   CMP Latest Ref Rng & Units 06/21/2017 06/21/2017 06/20/2017  Glucose 65 - 99 mg/dL 100(H) 96 106(H)  BUN 6 - 20 mg/dL 44(H) 45(H) 109(H)  Creatinine 0.61 - 1.24 mg/dL 11.27(H)  11.39(H) 24.03(H)  Sodium 135 - 145 mmol/L 137 136 142  Potassium 3.5 - 5.1 mmol/L 4.4 4.3 7.2(HH)  Chloride 101 - 111 mmol/L 97(L) 96(L) 104  CO2 22 - 32 mmol/L 27 28 18(L)  Calcium 8.9 - 10.3 mg/dL 8.3(L) 8.2(L) 8.9  Total Protein 6.5 - 8.1 g/dL 6.7 - 7.6  Total Bilirubin 0.3 - 1.2 mg/dL 0.5 - 0.6  Alkaline Phos 38 - 126 U/L 75 - 78  AST 15 - 41 U/L 26 - 41  ALT 17 - 63 U/L 12(L) - 16(L)   Assessment/Plan:  Active Problems:   Dyspnea   Acute pulmonary edema [facility])  Mr. Sliwa is a 61 yo male with ESRD on HD MWF, COPD, HTN, substance use disorder, and treated HCV who presents with 2 days of dyspnea, HTN, and hyperkalemia in the setting of not getting dialysis for the last 2 weeks.  Abdominal pain Improved since yesterday. No N/V. Tolerating PO intake. May be secondary to constipation? - Continue to monitor - Senna, simethicone PRN  Dyspnea and chest pain, resolved. Likely secondary to volume overload secondary to missing dialysis for 2 weeks. Received hemodialysis 12/11 and 12/12. VSS. Afebrile. O2 sats stable on RA. - PRN albuterol - Continue to monitor - Ok for discharge today  Hyperkalemia (7.2) with EKG changes, in the setting of ESRD and missed dialysis for 2 weeks Resolved after calcium gluconate, D50 + insulin, and HD. K 4.4 this morning. - Continue to monitor  Hypertensive urgency BP 210/120 on admission,  improved this morning to 129/109 after dialysis and home meds. Was not taking home meds, including amlodipine and metoprolol, for a week. - Continue home amlodipine 5mg  daily and metoprolol 50mg  daily  ESRD on HD MWF Missed dialysis for 2 weeks due to frustration with dialysis, however now realizes that this is something "he has to deal with". Has been undergoing dialysis for ~6 years. Received dialysis 12/11 and 12/12. - Nephrology consulted; appreciate their assistance - [place] for discharge today per Nephrology recs  Anemia Stable. Likely secondary to renal  disease/alcohol use. Anemia work-up unremarkable.  Dispo: Anticipated discharge today after SW eval. - SW consult for Meals on Wheels and transportation services  Colbert Ewing, MD 06/22/2017, 7:13 AM Pager: Mamie Nick [phone]

## 2017-06-22 NOTE — Progress Notes (Signed)
  Date: 06/22/2017  Patient name: Raymond [name]  Medical record number: [id number]  Date of birth: [birth year]   I have seen and evaluated this patient and I have discussed the plan of care with the house staff. Please see Dr. Allayne Gitelman note for complete details. I concur with her findings with the following additions/corrections:  Plan for discharge today.   Sid Falcon, MD 06/22/2017, 1:35 PM

## 2017-06-26 DIAGNOSIS — D631 Anemia in chronic kidney disease: Secondary | ICD-10-CM | POA: Diagnosis not present

## 2017-06-26 DIAGNOSIS — D509 Iron deficiency anemia, unspecified: Secondary | ICD-10-CM | POA: Diagnosis not present

## 2017-06-26 DIAGNOSIS — N2581 Secondary hyperparathyroidism of renal origin: Secondary | ICD-10-CM | POA: Diagnosis not present

## 2017-06-26 DIAGNOSIS — N186 End stage renal disease: Secondary | ICD-10-CM | POA: Diagnosis not present

## 2017-06-28 DIAGNOSIS — N186 End stage renal disease: Secondary | ICD-10-CM | POA: Diagnosis not present

## 2017-06-28 DIAGNOSIS — N2581 Secondary hyperparathyroidism of renal origin: Secondary | ICD-10-CM | POA: Diagnosis not present

## 2017-06-28 DIAGNOSIS — D631 Anemia in chronic kidney disease: Secondary | ICD-10-CM | POA: Diagnosis not present

## 2017-06-28 DIAGNOSIS — D509 Iron deficiency anemia, unspecified: Secondary | ICD-10-CM | POA: Diagnosis not present

## 2017-06-30 ENCOUNTER — Ambulatory Visit: Payer: Self-pay | Admitting: Family Medicine

## 2017-06-30 DIAGNOSIS — N2581 Secondary hyperparathyroidism of renal origin: Secondary | ICD-10-CM | POA: Diagnosis not present

## 2017-06-30 DIAGNOSIS — D509 Iron deficiency anemia, unspecified: Secondary | ICD-10-CM | POA: Diagnosis not present

## 2017-06-30 DIAGNOSIS — N186 End stage renal disease: Secondary | ICD-10-CM | POA: Diagnosis not present

## 2017-06-30 DIAGNOSIS — D631 Anemia in chronic kidney disease: Secondary | ICD-10-CM | POA: Diagnosis not present

## 2017-07-02 DIAGNOSIS — D631 Anemia in chronic kidney disease: Secondary | ICD-10-CM | POA: Diagnosis not present

## 2017-07-02 DIAGNOSIS — D509 Iron deficiency anemia, unspecified: Secondary | ICD-10-CM | POA: Diagnosis not present

## 2017-07-02 DIAGNOSIS — N2581 Secondary hyperparathyroidism of renal origin: Secondary | ICD-10-CM | POA: Diagnosis not present

## 2017-07-02 DIAGNOSIS — N186 End stage renal disease: Secondary | ICD-10-CM | POA: Diagnosis not present

## 2017-07-05 DIAGNOSIS — D631 Anemia in chronic kidney disease: Secondary | ICD-10-CM | POA: Diagnosis not present

## 2017-07-05 DIAGNOSIS — N186 End stage renal disease: Secondary | ICD-10-CM | POA: Diagnosis not present

## 2017-07-05 DIAGNOSIS — N2581 Secondary hyperparathyroidism of renal origin: Secondary | ICD-10-CM | POA: Diagnosis not present

## 2017-07-05 DIAGNOSIS — D509 Iron deficiency anemia, unspecified: Secondary | ICD-10-CM | POA: Diagnosis not present

## 2017-07-07 DIAGNOSIS — N2581 Secondary hyperparathyroidism of renal origin: Secondary | ICD-10-CM | POA: Diagnosis not present

## 2017-07-07 DIAGNOSIS — N186 End stage renal disease: Secondary | ICD-10-CM | POA: Diagnosis not present

## 2017-07-07 DIAGNOSIS — D509 Iron deficiency anemia, unspecified: Secondary | ICD-10-CM | POA: Diagnosis not present

## 2017-07-07 DIAGNOSIS — D631 Anemia in chronic kidney disease: Secondary | ICD-10-CM | POA: Diagnosis not present

## 2017-07-10 DIAGNOSIS — Z992 Dependence on renal dialysis: Secondary | ICD-10-CM | POA: Diagnosis not present

## 2017-07-10 DIAGNOSIS — N186 End stage renal disease: Secondary | ICD-10-CM | POA: Diagnosis not present

## 2017-07-10 DIAGNOSIS — N2889 Other specified disorders of kidney and ureter: Secondary | ICD-10-CM | POA: Diagnosis not present

## 2017-07-12 DIAGNOSIS — D509 Iron deficiency anemia, unspecified: Secondary | ICD-10-CM | POA: Diagnosis not present

## 2017-07-12 DIAGNOSIS — N2581 Secondary hyperparathyroidism of renal origin: Secondary | ICD-10-CM | POA: Diagnosis not present

## 2017-07-12 DIAGNOSIS — N186 End stage renal disease: Secondary | ICD-10-CM | POA: Diagnosis not present

## 2017-07-12 DIAGNOSIS — D631 Anemia in chronic kidney disease: Secondary | ICD-10-CM | POA: Diagnosis not present

## 2017-07-13 ENCOUNTER — Ambulatory Visit (INDEPENDENT_AMBULATORY_CARE_PROVIDER_SITE_OTHER): Payer: Medicare Other | Admitting: Family Medicine

## 2017-07-13 ENCOUNTER — Encounter: Payer: Self-pay | Admitting: Family Medicine

## 2017-07-13 VITALS — BP 114/60 | HR 79 | Temp 98.2°F | Resp 14 | Ht 68.0 in | Wt 156.0 lb

## 2017-07-13 DIAGNOSIS — H269 Unspecified cataract: Secondary | ICD-10-CM

## 2017-07-13 DIAGNOSIS — J810 Acute pulmonary edema: Secondary | ICD-10-CM

## 2017-07-13 DIAGNOSIS — N186 End stage renal disease: Secondary | ICD-10-CM

## 2017-07-13 DIAGNOSIS — I10 Essential (primary) hypertension: Secondary | ICD-10-CM

## 2017-07-13 DIAGNOSIS — R7309 Other abnormal glucose: Secondary | ICD-10-CM | POA: Diagnosis not present

## 2017-07-13 DIAGNOSIS — D638 Anemia in other chronic diseases classified elsewhere: Secondary | ICD-10-CM

## 2017-07-13 LAB — BASIC METABOLIC PANEL
BUN/Creatinine Ratio: 3 — ABNORMAL LOW (ref 10–24)
BUN: 33 mg/dL — ABNORMAL HIGH (ref 8–27)
CALCIUM: 7.6 mg/dL — AB (ref 8.6–10.2)
CO2: 29 mmol/L (ref 20–29)
CREATININE: 10.62 mg/dL — AB (ref 0.76–1.27)
Chloride: 101 mmol/L (ref 96–106)
GFR, EST AFRICAN AMERICAN: 5 mL/min/{1.73_m2} — AB (ref >59)
GFR, EST NON AFRICAN AMERICAN: 5 mL/min/{1.73_m2} — AB (ref >59)
Glucose: 116 mg/dL — ABNORMAL HIGH (ref 65–99)
Potassium: 5.1 mmol/L (ref 3.5–5.2)
Sodium: 139 mmol/L (ref 134–144)

## 2017-07-13 MED ORDER — AMLODIPINE BESYLATE 5 MG PO TABS: 5.0000 mg | ORAL_TABLET | Freq: Every day | ORAL | 2 refills | Status: DC

## 2017-07-13 MED ORDER — GABAPENTIN 300 MG PO CAPS: 300.0000 mg | ORAL_CAPSULE | Freq: Every evening | ORAL | 0 refills | Status: DC | PRN

## 2017-07-13 MED ORDER — METOPROLOL SUCCINATE ER 25 MG PO TB24: 50.0000 mg | ORAL_TABLET | Freq: Every day | ORAL | 1 refills | Status: DC

## 2017-07-13 NOTE — Patient Instructions (Addendum)

## 2017-07-13 NOTE — Progress Notes (Signed)
Patient ID: Raymond [name], male    DOB: August 26, 1955, 62 y.o.   MRN: [id number]  PCP: Scot Jun, FNP  Chief Complaint  Patient presents with  . Establish Care    Subjective:  HPI New Patient  Raymond [name] is a 62 y.o. male presents to establish care.  Medical problems significant for ESRD, HD Dialysis, COPD, Hypertension, hx Hep C (treated) and polysubstance abuse. Raymond [name] was recently hospitalized, approximately 3 weeks ago with dyspnea and atypical chest pain symptoms found to be related to acute pulmonary edema.  Prior to the onset of these symptoms patient admits to skipping dialysis for over 2 weeks. During hospitalization he suffered from hypertension urgency (210/120), tachycardia, and EKG significant for peaked T wave (patient was hyperkalemic K+ 7.2). Patient received urgent dialysis Patient admitted continued cocaine and alcohol use during admission. Today, he reports that he attending substance treatment and counseling program. He is trying to quit smoking-no cigarettes for last 2 days. He has remained compliance with dialysis since discharge from the hospital. He is followed Dr. Glean Hess at [facility]. He receives dialysis Monday, Wednesday, and Friday. Reports improvement of breathing. Denies cough, chest pain, dizziness, shortness of breath or wheezing.  Social History   Socioeconomic History  . Marital status: Single    Spouse name: Not on file  . Number of children: Not on file  . Years of education: Not on file  . Highest education level: Not on file  Social Needs  . Financial resource strain: Not on file  . Food insecurity - worry: Not on file  . Food insecurity - inability: Not on file  . Transportation needs - medical: Not on file  . Transportation needs - non-medical: Not on file  Occupational History  . Not on file  Tobacco Use  . Smoking status: Former Smoker    Packs/day: 1.00    Years: 25.00    Pack years: 25.00    Types: Cigarettes    Last  attempt to quit: 08/01/2011    Years since quitting: 5.9  . Smokeless tobacco: Never Used  Substance and Sexual Activity  . Alcohol use: Yes  . Drug use: Yes    Types: Cocaine    Comment: 05/17/17- cocaine 2 -3 weeks ago- "Now I go to Alcohol and Drug  classes- thinking for a change""  . Sexual activity: Yes    Birth control/protection: None  Other Topics Concern  . Not on file  Social History Narrative  . Not on file    Family History  Problem Relation Age of Onset  . Diabetes Father   . Hypertension Father    Review of Systems  Constitutional: Negative.   HENT: Negative.   Eyes: Positive for visual disturbance.       Thick film covering eyes"told years ago that I have cataracts in both eyes"  Respiratory: Negative.   Cardiovascular: Negative.   Gastrointestinal: Negative.   Musculoskeletal:       Left arm fistula-current dialysis access    Skin: Negative.   Neurological: Negative.   Psychiatric/Behavioral: Negative.     Patient Active Problem List   Diagnosis Date Noted  . Acute pulmonary edema (HCC)   . Dyspnea 06/20/2017  . Acute bronchitis   . COPD exacerbation ([place])   . End-stage renal disease on hemodialysis ([place])   . Hypoxia 12/05/2016  . Hyperkalemia 12/19/2015  . Hypoglycemia 12/19/2015  . Polysubstance abuse ([place]) 12/19/2015  . Homelessness 12/19/2015  . Anemia associated with  stage 5 chronic renal failure ([place]) 12/19/2015  . Atypical chest pain 12/19/2013  . Pain in limb 12/04/2012  . End stage renal disease ([place]) 12/04/2012  . HEPATITIS C 09/07/2006  . HYPERCHOLESTEROLEMIA 09/07/2006  . HYPERTENSION, BENIGN SYSTEMIC 09/07/2006    No Known Allergies  Prior to Admission medications   Medication Sig Start Date End Date Taking? Authorizing Provider  albuterol (PROVENTIL HFA;VENTOLIN HFA) 108 (90 Base) MCG/ACT inhaler Inhale 2 puffs into the lungs every 6 (six) hours as needed for wheezing or shortness of breath. 12/06/16  Yes Lorella Nimrod, MD   amLODipine (NORVASC) 5 MG tablet Take 1 tablet (5 mg total) by mouth daily. 06/22/17  Yes Colbert Ewing, MD  metoprolol succinate (TOPROL-XL) 25 MG 24 hr tablet Take 2 tablets (50 mg total) by mouth daily. 06/22/17  Yes Colbert Ewing, MD  thiamine (VITAMIN B-1) 100 MG tablet Take 100 mg by mouth 3 (three) times a week.    Yes [provider]  calcitRIOL (ROCALTROL) 0.25 MCG capsule Take 7 capsules (1.75 mcg total) by mouth every Monday, Wednesday, and Friday with hemodialysis. Patient not taking: Reported on 07/13/2017 12/07/16   Lorella Nimrod, MD  cinacalcet (SENSIPAR) 30 MG tablet Take 1 tablet (30 mg total) by mouth daily with supper. Patient not taking: Reported on 07/13/2017 12/21/15   Arrien, Jimmy Picket, MD  cyclobenzaprine (FLEXERIL) 5 MG tablet Take 1 tablet (5 mg total) by mouth 3 (three) times daily as needed (muscle soreness). Patient not taking: Reported on 07/13/2017 04/09/17   Rolland Porter, MD  docusate sodium (COLACE) 100 MG capsule Take 1 capsule (100 mg total) 2 (two) times daily by mouth. Patient not taking: Reported on 07/13/2017 05/18/17   Iran Planas, MD  folic acid (FOLVITE) 1 MG tablet Take 1 tablet (1 mg total) by mouth daily. Patient not taking: Reported on 07/13/2017 12/07/16   Lorella Nimrod, MD  gabapentin (NEURONTIN) 300 MG capsule Take 300 mg by mouth at bedtime as needed (sleep).    [provider]  Multiple Vitamin (MULTIVITAMIN WITH MINERALS) TABS tablet Take 1 tablet by mouth daily. 12/07/16   Lorella Nimrod, MD  naproxen (NAPROSYN) 250 MG tablet Take 1 tablet (250 mg total) by mouth 2 (two) times daily. Patient not taking: Reported on 07/13/2017 04/09/17   Rolland Porter, MD  oxyCODONE (OXY IR/ROXICODONE) 5 MG immediate release tablet Take 1 tablet (5 mg total) by mouth every 6 (six) hours as needed for severe pain. Patient not taking: Reported on 07/13/2017 03/29/17   Ward, Ozella Almond, PA-C  sevelamer (RENAGEL) 800 MG tablet Take 800 mg by mouth 3 (three)  times daily with meals.    [provider]  traMADol (ULTRAM) 50 MG tablet Take 1 tablet (50 mg total) by mouth every 6 (six) hours as needed. Patient not taking: Reported on 0/08/5425 01/01/75   Delora Fuel, MD    Past Medical, Surgical Family and Social History reviewed and updated.    Objective:   Today's Vitals   07/13/17 0904  BP: 114/60  Pulse: 79  Resp: 14  Temp: 98.2 F (36.8 C)  TempSrc: Oral  SpO2: 100%  Weight: 156 lb (70.8 kg)  Height: 5\' 8"  (1.727 m)    Wt Readings from Last 3 Encounters:  07/13/17 156 lb (70.8 kg)  06/21/17 156 lb 1.4 oz (70.8 kg)  05/31/17 160 lb (72.6 kg)    Physical Exam  Constitutional: He is oriented to person, place, and time. He appears well-developed and well-nourished.  HENT:  Head: Normocephalic and atraumatic.  Right Ear: External ear normal.  Left Ear: External ear normal.  Eyes: Conjunctivae and EOM are normal. Pupils are equal, round, and reactive to light.  Neck: Normal range of motion. Neck supple. No thyromegaly present.  Cardiovascular: Normal rate, regular rhythm, normal heart sounds and intact distal pulses.  Pulmonary/Chest: Effort normal and breath sounds normal.  Abdominal: He exhibits no distension and no mass. There is no tenderness. There is no rebound and no guarding.  Musculoskeletal: Normal range of motion. He exhibits no edema.  Lymphadenopathy:    He has no cervical adenopathy.  Neurological: He is alert and oriented to person, place, and time. Coordination normal.  Skin: Skin is warm and dry.  Psychiatric: He has a normal mood and affect. His behavior is normal. Thought content normal.   Assessment & Plan:  1. 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 No recent evaluation by Ophthalmology. Referring to [facility].   2. ESRD (end stage renal disease) ([place]), patient has resumed dialysis treatments as scheduled. Checking basic  metabolic panel to evaluate electrolytes.  Followed by [facility].  3. Anemia, chronic disease, secondary to ESRD. Checking CBC with Differential  4. Elevated glucose, Hemoglobin A1c pending.   5. Acute pulmonary edema ([place]), asymptomatic today. Resolved.   6. Essential hypertension, Controlled. Continue amlodipine and metoprolol.     Meds ordered this encounter  Medications  . gabapentin (NEURONTIN) 300 MG capsule    Sig: Take 1 capsule (300 mg total) by mouth at bedtime as needed (sleep).    Dispense:  30 capsule    Refill:  0    Order Specific Question:   Supervising Provider    Answer:   Tresa Garter [id number]  . amLODipine (NORVASC) 5 MG tablet    Sig: Take 1 tablet (5 mg total) by mouth daily.    Dispense:  60 tablet    Refill:  2    Order Specific Question:   Supervising Provider    Answer:   Tresa Garter [id number]  . metoprolol succinate (TOPROL-XL) 25 MG 24 hr tablet    Sig: Take 2 tablets (50 mg total) by mouth daily.    Dispense:  120 tablet    Refill:  1    Order Specific Question:   Supervising Provider    Answer:   Tresa Garter [id number]    Orders Placed This Encounter  Procedures  . Basic metabolic panel  . CBC with Differential  . Hemoglobin A1c  . Ambulatory referral to Ophthalmology    RTC: 3 months for hypertension follow-up.   Carroll Sage. Kenton Kingfisher, MSN, FNP-C [facility]  [address] Barbara Cower [place], [place] [address] [phone]

## 2017-07-14 DIAGNOSIS — N186 End stage renal disease: Secondary | ICD-10-CM | POA: Diagnosis not present

## 2017-07-14 DIAGNOSIS — D509 Iron deficiency anemia, unspecified: Secondary | ICD-10-CM | POA: Diagnosis not present

## 2017-07-14 DIAGNOSIS — N2581 Secondary hyperparathyroidism of renal origin: Secondary | ICD-10-CM | POA: Diagnosis not present

## 2017-07-14 DIAGNOSIS — D631 Anemia in chronic kidney disease: Secondary | ICD-10-CM | POA: Diagnosis not present

## 2017-07-14 LAB — CBC WITH DIFFERENTIAL/PLATELET
BASOS: 0 %
Basophils Absolute: 0 10*3/uL (ref 0.0–0.2)
EOS (ABSOLUTE): 0 10*3/uL (ref 0.0–0.4)
EOS: 2 %
HEMATOCRIT: 36 % — AB (ref 37.5–51.0)
Hemoglobin: 11.2 g/dL — ABNORMAL LOW (ref 13.0–17.7)
Immature Grans (Abs): 0 10*3/uL (ref 0.0–0.1)
Immature Granulocytes: 0 %
LYMPHS ABS: 1 10*3/uL (ref 0.7–3.1)
Lymphs: 40 %
MCH: 29.7 pg (ref 26.6–33.0)
MCHC: 31.1 g/dL — AB (ref 31.5–35.7)
MCV: 96 fL (ref 79–97)
MONOCYTES: 13 %
Monocytes Absolute: 0.3 10*3/uL (ref 0.1–0.9)
NEUTROS ABS: 1.1 10*3/uL — AB (ref 1.4–7.0)
Neutrophils: 45 %
Platelets: 189 10*3/uL (ref 150–379)
RBC: 3.77 x10E6/uL — ABNORMAL LOW (ref 4.14–5.80)
RDW: 16.9 % — ABNORMAL HIGH (ref 12.3–15.4)
WBC: 2.5 10*3/uL — CL (ref 3.4–10.8)

## 2017-07-14 LAB — HEMOGLOBIN A1C
ESTIMATED AVERAGE GLUCOSE: 85 mg/dL
Hgb A1c MFr Bld: 4.6 % — ABNORMAL LOW (ref 4.8–5.6)

## 2017-07-19 DIAGNOSIS — D631 Anemia in chronic kidney disease: Secondary | ICD-10-CM | POA: Diagnosis not present

## 2017-07-19 DIAGNOSIS — N186 End stage renal disease: Secondary | ICD-10-CM | POA: Diagnosis not present

## 2017-07-19 DIAGNOSIS — N2581 Secondary hyperparathyroidism of renal origin: Secondary | ICD-10-CM | POA: Diagnosis not present

## 2017-07-19 DIAGNOSIS — D509 Iron deficiency anemia, unspecified: Secondary | ICD-10-CM | POA: Diagnosis not present

## 2017-07-21 DIAGNOSIS — N2581 Secondary hyperparathyroidism of renal origin: Secondary | ICD-10-CM | POA: Diagnosis not present

## 2017-07-21 DIAGNOSIS — N186 End stage renal disease: Secondary | ICD-10-CM | POA: Diagnosis not present

## 2017-07-21 DIAGNOSIS — D631 Anemia in chronic kidney disease: Secondary | ICD-10-CM | POA: Diagnosis not present

## 2017-07-21 DIAGNOSIS — D509 Iron deficiency anemia, unspecified: Secondary | ICD-10-CM | POA: Diagnosis not present

## 2017-07-22 ENCOUNTER — Other Ambulatory Visit: Payer: Self-pay

## 2017-07-22 ENCOUNTER — Emergency Department (HOSPITAL_COMMUNITY): Payer: Medicare Other

## 2017-07-22 ENCOUNTER — Encounter (HOSPITAL_COMMUNITY): Payer: Self-pay | Admitting: *Deleted

## 2017-07-22 ENCOUNTER — Ambulatory Visit (HOSPITAL_COMMUNITY)
Admission: EM | Admit: 2017-07-22 | Discharge: 2017-07-22 | Disposition: A | Discharge status: Home / Self Care | Payer: Medicare Other | Source: Home / Self Care

## 2017-07-22 ENCOUNTER — Emergency Department (HOSPITAL_COMMUNITY)
Admission: EM | Admit: 2017-07-22 | Discharge: 2017-07-22 | Disposition: A | Discharge status: Home / Self Care | Payer: Medicare Other | Attending: Emergency Medicine | Admitting: Emergency Medicine

## 2017-07-22 ENCOUNTER — Encounter (HOSPITAL_COMMUNITY): Payer: Self-pay | Admitting: Emergency Medicine

## 2017-07-22 DIAGNOSIS — H53131 Sudden visual loss, right eye: Secondary | ICD-10-CM

## 2017-07-22 DIAGNOSIS — H2710 Unspecified dislocation of lens: Secondary | ICD-10-CM | POA: Insufficient documentation

## 2017-07-22 DIAGNOSIS — T8522XA Displacement of intraocular lens, initial encounter: Secondary | ICD-10-CM | POA: Diagnosis not present

## 2017-07-22 DIAGNOSIS — J449 Chronic obstructive pulmonary disease, unspecified: Secondary | ICD-10-CM | POA: Insufficient documentation

## 2017-07-22 DIAGNOSIS — I120 Hypertensive chronic kidney disease with stage 5 chronic kidney disease or end stage renal disease: Secondary | ICD-10-CM | POA: Insufficient documentation

## 2017-07-22 DIAGNOSIS — H5711 Ocular pain, right eye: Secondary | ICD-10-CM | POA: Diagnosis present

## 2017-07-22 DIAGNOSIS — N186 End stage renal disease: Secondary | ICD-10-CM | POA: Diagnosis not present

## 2017-07-22 DIAGNOSIS — Z79899 Other long term (current) drug therapy: Secondary | ICD-10-CM | POA: Insufficient documentation

## 2017-07-22 DIAGNOSIS — H547 Unspecified visual loss: Secondary | ICD-10-CM | POA: Diagnosis not present

## 2017-07-22 DIAGNOSIS — F17210 Nicotine dependence, cigarettes, uncomplicated: Secondary | ICD-10-CM | POA: Insufficient documentation

## 2017-07-22 HISTORY — DX: Dependence on renal dialysis: Z99.2

## 2017-07-22 IMAGING — CT CT ORBITS W/O CM
3 series · 10 of 47 positions shown, 11 images · non-contrast
Comparison: Head CT dated [DATE].

CLINICAL DATA: Right eye redness following a scratch.

EXAM:
CT ORBITS WITHOUT CONTRAST
TECHNIQUE: Multidetector CT images were obtained using the standard protocol
without intravenous contrast.

[Series 4: orbits 2.0 sag st · sagittal · 0.18mm/px · 3 of 75 slices shown]
[im 25/75  bone]
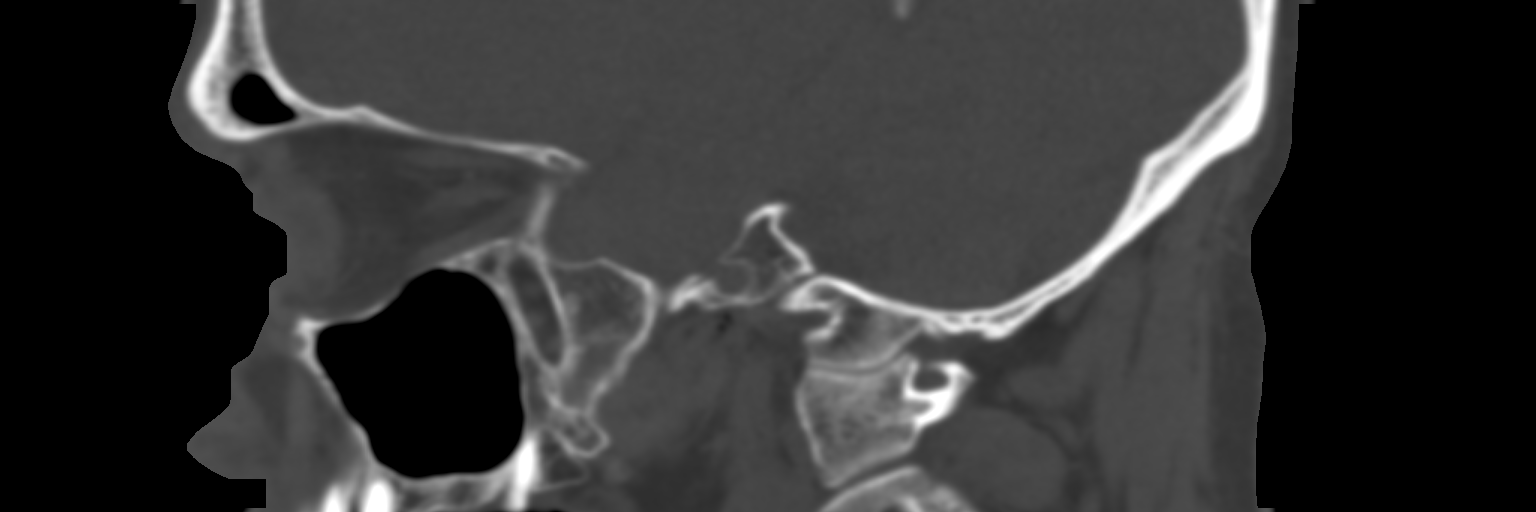
[im 38/75  bone]
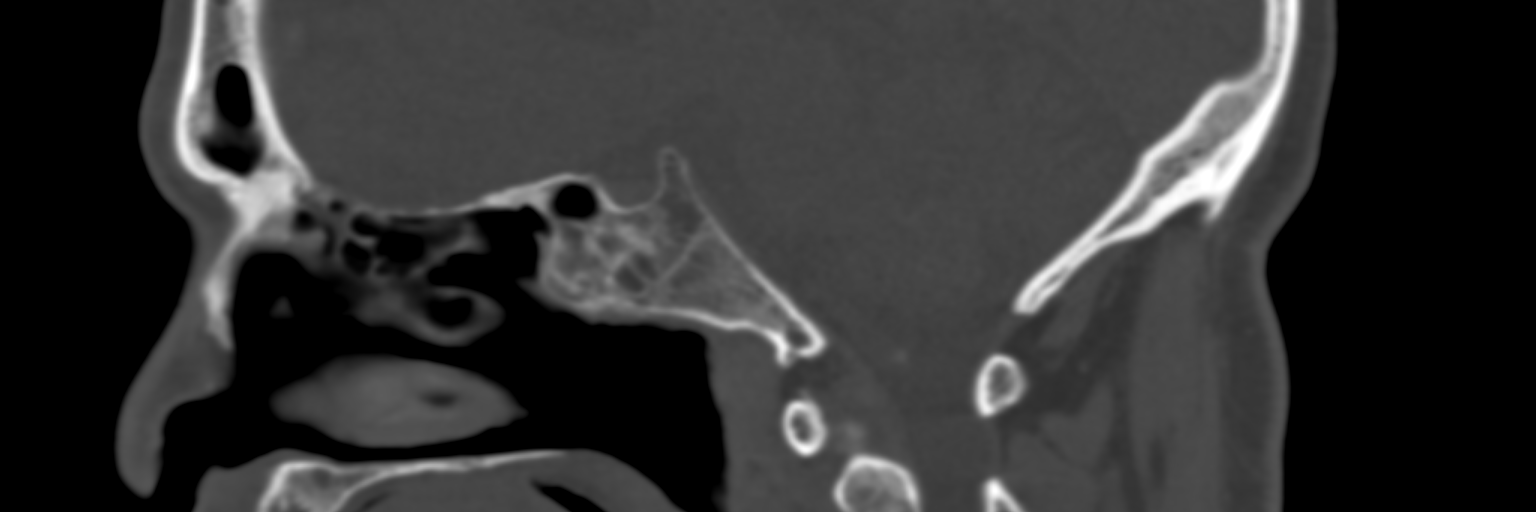
[im 50/75  bone]
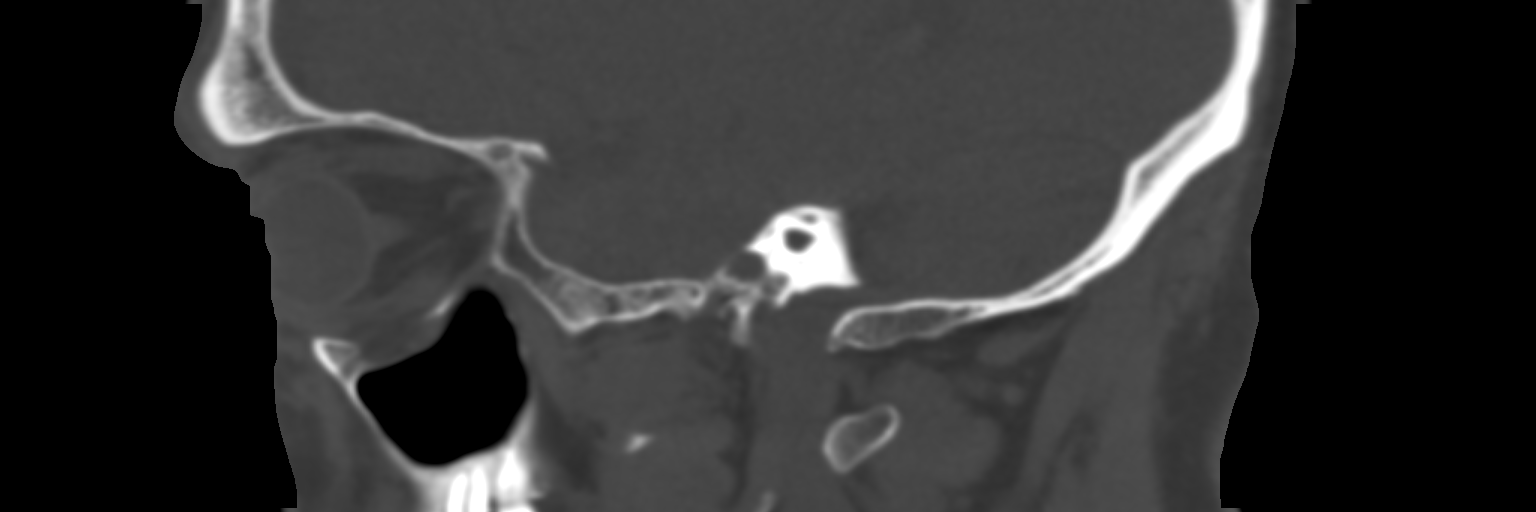

[Series 6: orbits 2.0 st · axial · 0.30mm/px · z∈[-140,-82]mm · 4 of 41 slices shown, 5 images]
[im 6/41  brain]
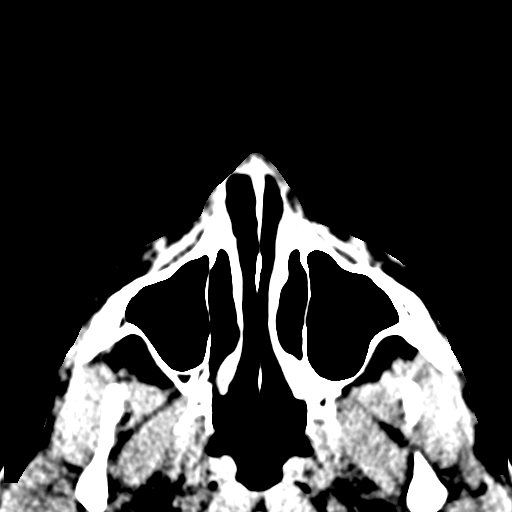
[im 6/41  bone]
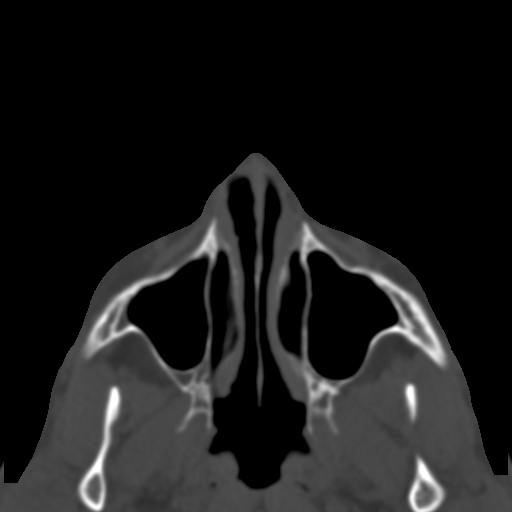
[im 16/41  bone]
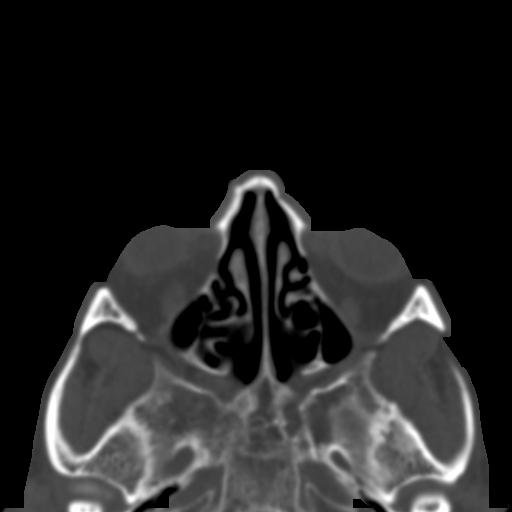
[im 25/41  bone]
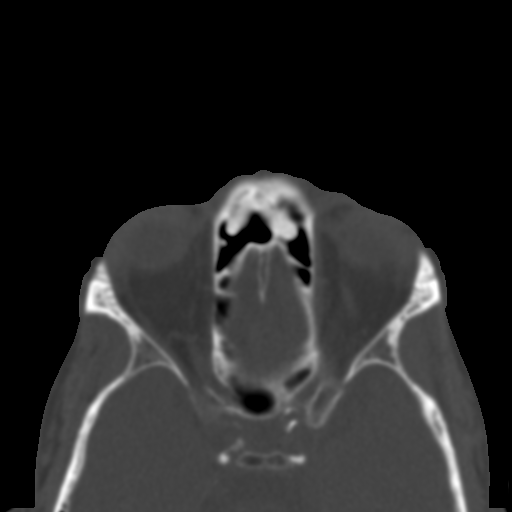
[im 35/41  bone]
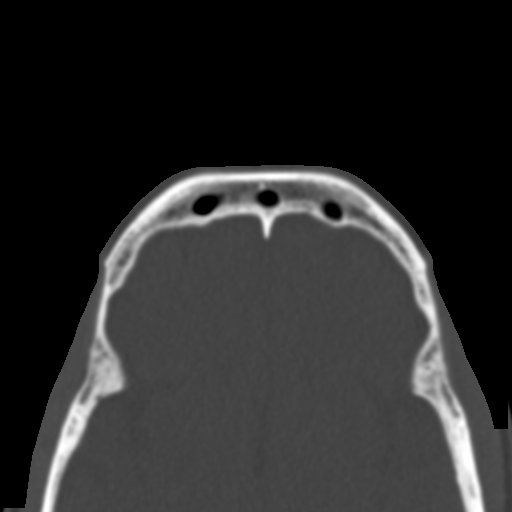

[Series 8: orbits 2.0 cor st · coronal · 0.21mm/px · 3 of 62 slices shown]
[im 28/62  bone]
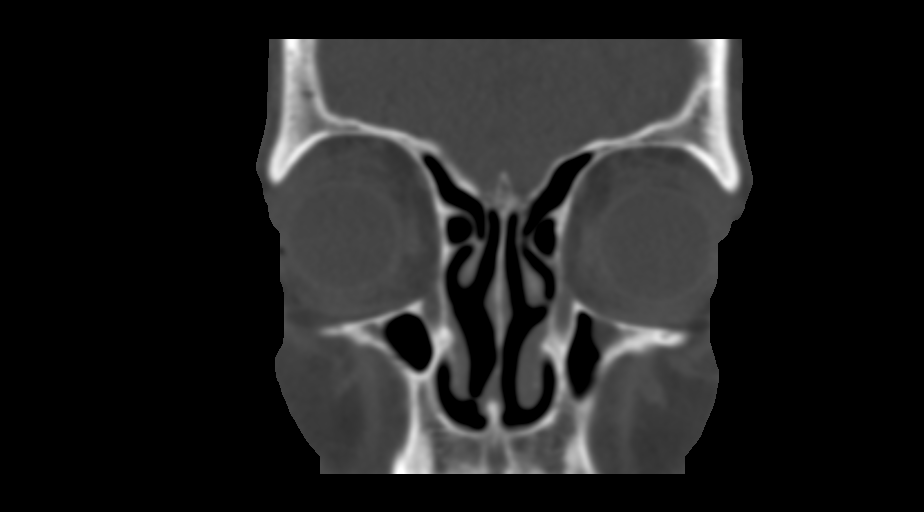
[im 34/62  bone]
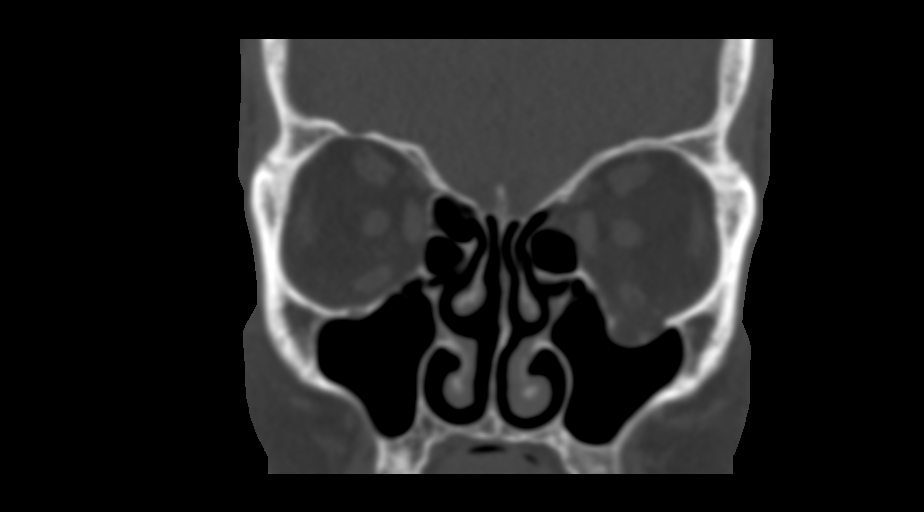
[im 41/62  bone]
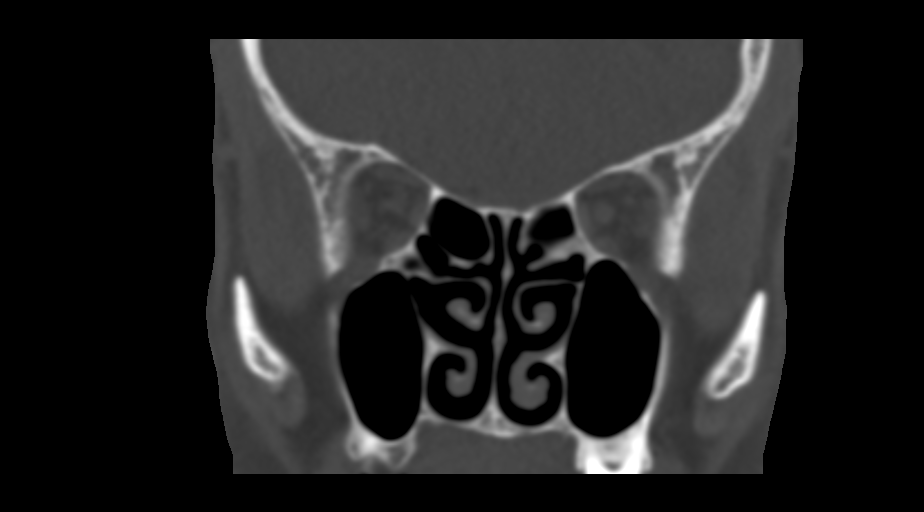

[10 of 47 positions shown; findings below may reference images not displayed]

FINDINGS: Orbits: The lens in the right eye is dislocated posteriorly and
medially. The globe is intact. No intraorbital soft tissue edema.
Old left orbital floor blow-out fracture with herniated fat. Old
distal nasal bone fracture with nonunion and no significant
displacement.

Visualized sinuses: Normally pneumatized.

Soft tissues: Unremarkable.

Limited intracranial: Unremarkable.
IMPRESSION: 1. Dislocated lens in the right eye.
2. Old nasal bone and left orbital floor fractures.

## 2017-07-22 MED ORDER — FLUORESCEIN SODIUM 0.6 MG OP STRP
ORAL_STRIP | OPHTHALMIC | Status: AC
  Filled 2017-07-22: qty 1

## 2017-07-22 MED ORDER — TETRACAINE HCL 0.5 % OP SOLN
OPHTHALMIC | Status: AC
  Filled 2017-07-22: qty 4

## 2017-07-22 MED ORDER — CYCLOPENTOLATE HCL 1 % OP SOLN: 1.0000 [drp] | Freq: Two times a day (BID) | OPHTHALMIC | 0 refills | Status: DC

## 2017-07-22 MED ORDER — TETRACAINE HCL 0.5 % OP SOLN
2.0000 [drp] | Freq: Once | OPHTHALMIC | Status: AC
  Administered 2017-07-22: 2 [drp] via OPHTHALMIC
  Filled 2017-07-22: qty 4

## 2017-07-22 MED ORDER — FLUORESCEIN SODIUM 1 MG OP STRP
1.0000 | ORAL_STRIP | Freq: Once | OPHTHALMIC | Status: AC
  Administered 2017-07-22: 1 via OPHTHALMIC
  Filled 2017-07-22: qty 1

## 2017-07-22 MED ORDER — PREDNISOLONE ACETATE 1 % OP SUSP: 1.0000 [drp] | Freq: Four times a day (QID) | OPHTHALMIC | 0 refills | Status: DC

## 2017-07-22 MED ORDER — EYE WASH OPHTH SOLN
OPHTHALMIC | Status: AC
  Filled 2017-07-22: qty 118

## 2017-07-22 MED ORDER — ACETAMINOPHEN 325 MG PO TABS: 650.0000 mg | ORAL_TABLET | Freq: Four times a day (QID) | ORAL | 0 refills | Status: DC | PRN

## 2017-07-22 NOTE — ED Triage Notes (Addendum)
Reports "horse playing" 2 days ago, believes someone else's finger scratched right eye.  Has hx of cataracts, but since incident has "very foggy" vision.  "I can't see nothing; I see you, but you're foggy".  Denies any vision changes with left eye.  Pt is on hemodialysis.

## 2017-07-22 NOTE — ED Notes (Signed)
ED Provider at bedside. 

## 2017-07-22 NOTE — ED Notes (Signed)
Per discharge instructions from provider, informed pt to go to ED for further evaluation and pt verbalized and showed understanding.

## 2017-07-22 NOTE — ED Provider Notes (Signed)
[place]   [id number] 07/22/17 Arrival Time: 1428   SUBJECTIVE:  Raymond [name] is a 62 y.o. male who presents to the urgent care with complaint of "horse playing" 2 days ago, believes someone else's finger scratched right eye.   Has hx of cataracts, but since incident has "very foggy" vision.  "I can't see nothing; I see you, but you're foggy".  Denies any vision changes with left eye.  Pt is on hemodialysis.   Past Medical History:  Diagnosis Date  . Anemia   . Dyspnea    "with too much fluid"  . ESRD (end stage renal disease) on dialysis [facility])    "MWF; [facility] Rinks" (07/22/17)  . Hemodialysis patient ([place])   . Hepatitis C    Still positive s/p liver biopsy at [facility]  and interferon therapy for 6 months. Most recent lab work was on 10/24/12  . Hepatitis C    "took the tx; gone now" (12/05/2016)  Was treated  . History of blood transfusion ~ 2012/2013   "related to my kidneys; blood was low"  . Hypertension   . Substance abuse ([place])   . Thyroid disease    Family History  Problem Relation Age of Onset  . Diabetes Father   . Hypertension Father    Social History   Socioeconomic History  . Marital status: Single    Spouse name: Not on file  . Number of children: Not on file  . Years of education: Not on file  . Highest education level: Not on file  Social Needs  . Financial resource strain: Not on file  . Food insecurity - worry: Not on file  . Food insecurity - inability: Not on file  . Transportation needs - medical: Not on file  . Transportation needs - non-medical: Not on file  Occupational History  . Not on file  Tobacco Use  . Smoking status: Current Every Day Smoker    Packs/day: 1.00    Years: 25.00    Pack years: 25.00    Types: Cigarettes    Last attempt to quit: 08/01/2011    Years since quitting: 5.9  . Smokeless tobacco: Never Used  Substance and Sexual Activity  . Alcohol use: Yes    Comment: occasionally  . Drug use: Yes    Types:  Cocaine    Comment: clean x approx 2 months (07/22/17)  . Sexual activity: Not on file  Other Topics Concern  . Not on file  Social History Narrative  . Not on file   Current Meds  Medication Sig  . amLODipine (NORVASC) 5 MG tablet Take 1 tablet (5 mg total) by mouth daily.  [name] gabapentin (NEURONTIN) 300 MG capsule Take 1 capsule (300 mg total) by mouth at bedtime as needed (sleep).  . metoprolol succinate (TOPROL-XL) 25 MG 24 hr tablet Take 2 tablets (50 mg total) by mouth daily.   No Known Allergies    ROS: As per HPI, remainder of ROS negative.   OBJECTIVE:   There were no vitals filed for this visit.   General appearance: alert; no distress Eyes: PERRL; EOMI; conjunctiva bilateral subconjunctival hemorrhages.  No red reflex on right Flourescein negative on right HENT: normocephalic; atraumatic; TMs normal, canal normal, external ears normal without trauma; nasal mucosa normal; oral mucosa normal Neck: supple Extremities: no cyanosis or edema; symmetrical with no gross deformities Skin: warm and dry Neurologic: normal gait; grossly normal Psychological: alert and cooperative; normal mood and affect  Labs:  Results for orders placed or performed in visit on 82/50/03  Basic metabolic panel  Result Value Ref Range   Glucose 116 (H) 65 - 99 mg/dL   BUN 33 (H) 8 - 27 mg/dL   Creatinine, Ser 10.62 (H) 0.76 - 1.27 mg/dL   GFR calc non Af Amer 5 (L) >59 mL/min/1.73   GFR calc Af Amer 5 (L) >59 mL/min/1.73   BUN/Creatinine Ratio 3 (L) 10 - 24   Sodium 139 134 - 144 mmol/L   Potassium 5.1 3.5 - 5.2 mmol/L   Chloride 101 96 - 106 mmol/L   CO2 29 20 - 29 mmol/L   Calcium 7.6 (L) 8.6 - 10.2 mg/dL  CBC with Differential  Result Value Ref Range   WBC 2.5 (LL) 3.4 - 10.8 x10E3/uL   RBC 3.77 (L) 4.14 - 5.80 x10E6/uL   Hemoglobin 11.2 (L) 13.0 - 17.7 g/dL   Hematocrit 36.0 (L) 37.5 - 51.0 %   MCV 96 79 - 97 fL   MCH 29.7 26.6 - 33.0 pg   MCHC 31.1 (L) 31.5 - 35.7  g/dL   RDW 16.9 (H) 12.3 - 15.4 %   Platelets 189 150 - 379 x10E3/uL   Neutrophils 45 Not Estab. %   Lymphs 40 Not Estab. %   Monocytes 13 Not Estab. %   Eos 2 Not Estab. %   Basos 0 Not Estab. %   Neutrophils Absolute 1.1 (L) 1.4 - 7.0 x10E3/uL   Lymphocytes Absolute 1.0 0.7 - 3.1 x10E3/uL   Monocytes Absolute 0.3 0.1 - 0.9 x10E3/uL   EOS (ABSOLUTE) 0.0 0.0 - 0.4 x10E3/uL   Basophils Absolute 0.0 0.0 - 0.2 x10E3/uL   Immature Granulocytes 0 Not Estab. %   Immature Grans (Abs) 0.0 0.0 - 0.1 x10E3/uL  Hemoglobin A1c  Result Value Ref Range   Hgb A1c MFr Bld 4.6 (L) 4.8 - 5.6 %   Est. average glucose Bld gHb Est-mCnc 85 mg/dL    Labs Reviewed - No data to display  No results found.     ASSESSMENT & PLAN:  1. Blindness, sudden, right   I am sending you to the emergency room because of the sudden blindness induced by trauma in the context of dialysis which causes easy bleeding.  We need to rule out intraocular bleeding  Reviewed expectations re: course of current medical issues. Questions answered. Outlined signs and symptoms indicating need for more acute intervention. Patient verbalized understanding. After Visit Summary given.    Procedures:      Robyn Haber, MD 07/22/17 1544

## 2017-07-22 NOTE — Discharge Instructions (Signed)
We saw you in the emergency room for your vision changes.  Workup in the ER shows that you have lens detachment.  We spoke with Dr. Posey Pronto, who will be happy to see you in the clinic on Tuesday at 9 AM. Please take the medicines prescribed for pain control. Also, at nighttime please sleep in a reclined position, which may help with pain as well.

## 2017-07-22 NOTE — ED Triage Notes (Signed)
Pt presents to ED for [facility] after being assessed for right eye pain.   Eyes is blood shot with some "blood behind the eye" according to [facility].  Patient also c/o blurred/hazy vision.  Note from [facility] states "sending you because of the sudden blindness induced by trauma in the context of dialysis which causes easy bleeding".

## 2017-07-22 NOTE — ED Provider Notes (Signed)
[facility] Provider Note   CSN: [id number] Arrival date & time: 07/22/17  1559     History   Chief Complaint Chief Complaint  Patient presents with  . Eye Pain    HPI Raymond [name] is a 62 y.o. male.  HPI   62 year old male with history of end-stage renal disease, hepatitis C, hypertension, substance abuse comes in with chief complaint of right-sided vision loss.  Patient reports that 3 days ago he accidentally had a blunt trauma to the right eye.  Patient had some aching initially, which he thought would get better.  The next day he started having worsening of his pain with some blurry vision.  Over time patient's symptoms have gotten worse and so he decided to come to the ER.  At this time patient can barely see anything through his right eye.  Patient also has 7 out of 10 constant achy pain.  Patient has redness in both of his eyes, but the left eye has no other functional deficits.  Past Medical History:  Diagnosis Date  . Anemia   . Dyspnea    "with too much fluid"  . ESRD (end stage renal disease) on dialysis [facility])    "MWF; Jeneen Rinks" (07/22/17)  . Hemodialysis patient ([place])   . Hepatitis C    Still positive s/p liver biopsy at [facility]  and interferon therapy for 6 months. Most recent lab work was on 10/24/12  . Hepatitis C    "took the tx; gone now" (12/05/2016)  Was treated  . History of blood transfusion ~ 2012/2013   "related to my kidneys; blood was low"  . Hypertension   . Substance abuse ([place])   . Thyroid disease     Patient Active Problem List   Diagnosis Date Noted  . Acute pulmonary edema (HCC)   . Dyspnea 06/20/2017  . Acute bronchitis   . COPD exacerbation ([place])   . End-stage renal disease on hemodialysis ([place])   . Hypoxia 12/05/2016  . Hyperkalemia 12/19/2015  . Hypoglycemia 12/19/2015  . Polysubstance abuse ([place]) 12/19/2015  . Homelessness 12/19/2015  . Anemia associated with stage 5 chronic renal failure  ([place]) 12/19/2015  . Atypical chest pain 12/19/2013  . Pain in limb 12/04/2012  . End stage renal disease ([place]) 12/04/2012  . HEPATITIS C 09/07/2006  . HYPERCHOLESTEROLEMIA 09/07/2006  . HYPERTENSION, BENIGN SYSTEMIC 09/07/2006    Past Surgical History:  Procedure Laterality Date  . AV FISTULA PLACEMENT Left Aug. 2013 ?  [name] LIVER BIOPSY    . ORIF ULNAR FRACTURE Left 05/18/2017   Procedure: OPEN REDUCTION INTERNAL FIXATION (ORIF) ULNAR FRACTURE;  Surgeon: Iran Planas, MD;  Location: [place];  Service: Orthopedics;  Laterality: Left;  . SHUNTOGRAM N/A 12/11/2012   Procedure: Earney Mallet;  Surgeon: Serafina Mitchell, MD;  Location: [facility] CATH LAB;  Service: Cardiovascular;  Laterality: N/A;       Home Medications    Prior to Admission medications   Medication Sig Start Date End Date Taking? Authorizing Provider  acetaminophen (TYLENOL) 325 MG tablet Take 2 tablets (650 mg total) by mouth every 6 (six) hours as needed. 07/22/17   Varney Biles, MD  albuterol (PROVENTIL HFA;VENTOLIN HFA) 108 (90 Base) MCG/ACT inhaler Inhale 2 puffs into the lungs every 6 (six) hours as needed for wheezing or shortness of breath. 12/06/16   Lorella Nimrod, MD  amLODipine (NORVASC) 5 MG tablet Take 1 tablet (5 mg total) by mouth daily. 07/13/17   Kenton Kingfisher,  Carroll Sage, FNP  cyclopentolate (CYCLODRYL,CYCLOGYL) 1 % ophthalmic solution Place 1 drop into the right eye 2 (two) times daily. 07/22/17   Varney Biles, MD  gabapentin (NEURONTIN) 300 MG capsule Take 1 capsule (300 mg total) by mouth at bedtime as needed (sleep). 07/13/17   Scot Jun, FNP  metoprolol succinate (TOPROL-XL) 25 MG 24 hr tablet Take 2 tablets (50 mg total) by mouth daily. 07/13/17   Scot Jun, FNP  Multiple Vitamin (MULTIVITAMIN WITH MINERALS) TABS tablet Take 1 tablet by mouth daily. 12/07/16   Lorella Nimrod, MD  prednisoLONE acetate (PRED FORTE) 1 % ophthalmic suspension Place 1 drop into the right eye 4 (four) times daily. 07/22/17    Varney Biles, MD  sevelamer (RENAGEL) 800 MG tablet Take 800 mg by mouth 3 (three) times daily with meals.    [provider]  thiamine (VITAMIN B-1) 100 MG tablet Take 100 mg by mouth 3 (three) times a week.     [provider]    Family History Family History  Problem Relation Age of Onset  . Diabetes Father   . Hypertension Father     Social History Social History   Tobacco Use  . Smoking status: Current Every Day Smoker    Packs/day: 1.00    Years: 25.00    Pack years: 25.00    Types: Cigarettes    Last attempt to quit: 08/01/2011    Years since quitting: 5.9  . Smokeless tobacco: Never Used  Substance Use Topics  . Alcohol use: Yes    Comment: occasionally  . Drug use: Yes    Types: Cocaine    Comment: clean x approx 2 months (07/22/17)     Allergies   Patient has no known allergies.   Review of Systems Review of Systems  Constitutional: Positive for activity change.  Eyes: Positive for photophobia, pain, redness and visual disturbance.  Allergic/Immunologic: Negative for immunocompromised state.  Hematological: Does not bruise/bleed easily.     Physical Exam Updated Vital Signs BP (!) 166/101 (BP Location: Right Arm)   Pulse 86   Temp 98 F (36.7 C) (Oral)   Ht 5\' 8"  (1.727 m)   Wt 72.6 kg (160 lb)   SpO2 97%   BMI 24.33 kg/m   Physical Exam  Constitutional: He is oriented to person, place, and time. He appears well-developed.  HENT:  Head: Atraumatic.  Eyes:  Right Eye exam: No significant periorbital ecchymosis EOMI, right eye pupil is 2 mm, left eye pupil is 4 mm + photophobia Gross bedside visual acuity via snellen chart reveals significant visual deficits. Eye lid eversion reveals no foreign body Pt has ++ chemosis Fluorecin test under woods lamp reveals no corneal abrasion or ulcer Tono-Pen pressures measured at 14 on both attempts   Neck: Neck supple.  Cardiovascular: Normal rate.  Pulmonary/Chest: Effort  normal.  Neurological: He is alert and oriented to person, place, and time.  Skin: Skin is warm.  Nursing note and vitals reviewed.    ED Treatments / Results  Labs (all labs ordered are listed, but only abnormal results are displayed) Labs Reviewed - No data to display  EKG  EKG Interpretation None       Radiology Ct Orbits Wo Contrast  Result Date: 07/22/2017 CLINICAL DATA:  Right eye redness following a scratch. EXAM: CT ORBITS WITHOUT CONTRAST TECHNIQUE: Multidetector CT images were obtained using the standard protocol without intravenous contrast. COMPARISON:  Head CT dated 01/30/2008. FINDINGS: Orbits: The lens in  the right eye is dislocated posteriorly and medially. The globe is intact. No intraorbital soft tissue edema. Old left orbital floor blow-out fracture with herniated fat. 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 Visualized sinuses: Normally pneumatized. Soft tissues: Unremarkable. Limited intracranial: Unremarkable. IMPRESSION: 1. Dislocated lens in the right eye. 2. Old nasal bone and left orbital floor fractures. Electronically Signed   By: Claudie Revering M.D.   On: 07/22/2017 18:10    Procedures Procedures (including critical care time)  Medications Ordered in ED Medications  tetracaine (PONTOCAINE) 0.5 % ophthalmic solution 2 drop (2 drops Right Eye Given 07/22/17 1858)  fluorescein ophthalmic strip 1 strip (1 strip Right Eye Given 07/22/17 1858)     Initial Impression / Assessment and Plan / ED Course  I have reviewed the triage vital signs and the nursing notes.  Pertinent labs & imaging results that were available during my care of the patient were reviewed by me and considered in my medical decision making (see chart for details).     Patient comes in with chief complaint of right-sided vision change.  Patient had blunt trauma 3 days ago, since then he has had worsening pain in his eye along with gradual vision loss.   Patient on our visual acuity exam is extremely poor vision to the right eye.  CT orbits revealed lens dislocation.  Otherwise patient does not have corneal abrasion or elevated intra-ocular pressures.  I spoke with Dr. Posey Pronto, ophthalmology.  He recommends that patient be seen on Tuesday at 9 AM in his clinic. Cycloplegic medicine, anti-inflammatory medicine prescribed as per Dr. Serita Grit request.  Results from the ER workup discussed with the patient face to face and all questions answered to the best of my ability.   Final Clinical Impressions(s) / ED Diagnoses   Final diagnoses:  Lens dislocation    ED Discharge Orders        Ordered    cyclopentolate (CYCLODRYL,CYCLOGYL) 1 % ophthalmic solution  2 times daily     07/22/17 1951    prednisoLONE acetate (PRED FORTE) 1 % ophthalmic suspension  4 times daily     07/22/17 1951    acetaminophen (TYLENOL) 325 MG tablet  Every 6 hours PRN     07/22/17 1951       Varney Biles, MD 07/22/17 1958

## 2017-07-22 NOTE — Discharge Instructions (Signed)
I am sending you to the emergency room because of the sudden blindness induced by trauma in the context of dialysis which causes easy bleeding.  We need to rule out intraocular bleeding

## 2017-07-24 DIAGNOSIS — D509 Iron deficiency anemia, unspecified: Secondary | ICD-10-CM | POA: Diagnosis not present

## 2017-07-24 DIAGNOSIS — D631 Anemia in chronic kidney disease: Secondary | ICD-10-CM | POA: Diagnosis not present

## 2017-07-24 DIAGNOSIS — N2581 Secondary hyperparathyroidism of renal origin: Secondary | ICD-10-CM | POA: Diagnosis not present

## 2017-07-24 DIAGNOSIS — N186 End stage renal disease: Secondary | ICD-10-CM | POA: Diagnosis not present

## 2017-07-25 DIAGNOSIS — S0511XA Contusion of eyeball and orbital tissues, right eye, initial encounter: Secondary | ICD-10-CM | POA: Diagnosis not present

## 2017-07-25 DIAGNOSIS — H27131 Posterior dislocation of lens, right eye: Secondary | ICD-10-CM | POA: Diagnosis not present

## 2017-07-25 DIAGNOSIS — H2701 Aphakia, right eye: Secondary | ICD-10-CM | POA: Diagnosis not present

## 2017-07-25 DIAGNOSIS — H53131 Sudden visual loss, right eye: Secondary | ICD-10-CM | POA: Diagnosis not present

## 2017-07-25 DIAGNOSIS — S0591XA Unspecified injury of right eye and orbit, initial encounter: Secondary | ICD-10-CM | POA: Diagnosis not present

## 2017-07-25 DIAGNOSIS — S058X1A Other injuries of right eye and orbit, initial encounter: Secondary | ICD-10-CM | POA: Diagnosis not present

## 2017-07-26 DIAGNOSIS — N2581 Secondary hyperparathyroidism of renal origin: Secondary | ICD-10-CM | POA: Diagnosis not present

## 2017-07-26 DIAGNOSIS — D509 Iron deficiency anemia, unspecified: Secondary | ICD-10-CM | POA: Diagnosis not present

## 2017-07-26 DIAGNOSIS — N186 End stage renal disease: Secondary | ICD-10-CM | POA: Diagnosis not present

## 2017-07-26 DIAGNOSIS — D631 Anemia in chronic kidney disease: Secondary | ICD-10-CM | POA: Diagnosis not present

## 2017-07-28 DIAGNOSIS — N186 End stage renal disease: Secondary | ICD-10-CM | POA: Diagnosis not present

## 2017-07-28 DIAGNOSIS — N2581 Secondary hyperparathyroidism of renal origin: Secondary | ICD-10-CM | POA: Diagnosis not present

## 2017-07-28 DIAGNOSIS — D509 Iron deficiency anemia, unspecified: Secondary | ICD-10-CM | POA: Diagnosis not present

## 2017-07-28 DIAGNOSIS — D631 Anemia in chronic kidney disease: Secondary | ICD-10-CM | POA: Diagnosis not present

## 2017-08-01 DIAGNOSIS — H33021 Retinal detachment with multiple breaks, right eye: Secondary | ICD-10-CM | POA: Diagnosis not present

## 2017-08-01 DIAGNOSIS — T8522XA Displacement of intraocular lens, initial encounter: Secondary | ICD-10-CM | POA: Diagnosis not present

## 2017-08-01 DIAGNOSIS — H27131 Posterior dislocation of lens, right eye: Secondary | ICD-10-CM | POA: Diagnosis not present

## 2017-08-02 DIAGNOSIS — D509 Iron deficiency anemia, unspecified: Secondary | ICD-10-CM | POA: Diagnosis not present

## 2017-08-02 DIAGNOSIS — D631 Anemia in chronic kidney disease: Secondary | ICD-10-CM | POA: Diagnosis not present

## 2017-08-02 DIAGNOSIS — N2581 Secondary hyperparathyroidism of renal origin: Secondary | ICD-10-CM | POA: Diagnosis not present

## 2017-08-02 DIAGNOSIS — N186 End stage renal disease: Secondary | ICD-10-CM | POA: Diagnosis not present

## 2017-08-07 ENCOUNTER — Emergency Department (HOSPITAL_COMMUNITY): Payer: Medicare Other

## 2017-08-07 ENCOUNTER — Encounter (HOSPITAL_COMMUNITY): Payer: Self-pay

## 2017-08-07 ENCOUNTER — Other Ambulatory Visit: Payer: Self-pay

## 2017-08-07 ENCOUNTER — Observation Stay (HOSPITAL_COMMUNITY)
Admission: EM | Admit: 2017-08-07 | Discharge: 2017-08-08 | Disposition: A | Discharge status: Home / Self Care | Payer: Medicare Other | Attending: Internal Medicine | Admitting: Internal Medicine

## 2017-08-07 DIAGNOSIS — Z79899 Other long term (current) drug therapy: Secondary | ICD-10-CM | POA: Diagnosis not present

## 2017-08-07 DIAGNOSIS — J90 Pleural effusion, not elsewhere classified: Secondary | ICD-10-CM | POA: Insufficient documentation

## 2017-08-07 DIAGNOSIS — N186 End stage renal disease: Secondary | ICD-10-CM | POA: Diagnosis not present

## 2017-08-07 DIAGNOSIS — D509 Iron deficiency anemia, unspecified: Secondary | ICD-10-CM | POA: Diagnosis not present

## 2017-08-07 DIAGNOSIS — I120 Hypertensive chronic kidney disease with stage 5 chronic kidney disease or end stage renal disease: Secondary | ICD-10-CM | POA: Insufficient documentation

## 2017-08-07 DIAGNOSIS — B1920 Unspecified viral hepatitis C without hepatic coma: Secondary | ICD-10-CM | POA: Diagnosis not present

## 2017-08-07 DIAGNOSIS — R509 Fever, unspecified: Secondary | ICD-10-CM | POA: Diagnosis not present

## 2017-08-07 DIAGNOSIS — D649 Anemia, unspecified: Secondary | ICD-10-CM | POA: Insufficient documentation

## 2017-08-07 DIAGNOSIS — R0602 Shortness of breath: Secondary | ICD-10-CM | POA: Diagnosis not present

## 2017-08-07 DIAGNOSIS — N2581 Secondary hyperparathyroidism of renal origin: Secondary | ICD-10-CM | POA: Diagnosis not present

## 2017-08-07 DIAGNOSIS — R1084 Generalized abdominal pain: Secondary | ICD-10-CM | POA: Diagnosis not present

## 2017-08-07 DIAGNOSIS — E8770 Fluid overload, unspecified: Principal | ICD-10-CM | POA: Diagnosis present

## 2017-08-07 DIAGNOSIS — F17210 Nicotine dependence, cigarettes, uncomplicated: Secondary | ICD-10-CM | POA: Insufficient documentation

## 2017-08-07 DIAGNOSIS — E079 Disorder of thyroid, unspecified: Secondary | ICD-10-CM | POA: Diagnosis not present

## 2017-08-07 DIAGNOSIS — D631 Anemia in chronic kidney disease: Secondary | ICD-10-CM | POA: Diagnosis not present

## 2017-08-07 DIAGNOSIS — Z992 Dependence on renal dialysis: Secondary | ICD-10-CM | POA: Insufficient documentation

## 2017-08-07 LAB — CBC WITH DIFFERENTIAL/PLATELET
BASOS ABS: 0 10*3/uL (ref 0.0–0.1)
BASOS PCT: 0 %
EOS ABS: 0 10*3/uL (ref 0.0–0.7)
Eosinophils Relative: 0 %
HCT: 29.2 % — ABNORMAL LOW (ref 39.0–52.0)
Hemoglobin: 8.8 g/dL — ABNORMAL LOW (ref 13.0–17.0)
LYMPHS PCT: 16 %
Lymphs Abs: 0.8 10*3/uL (ref 0.7–4.0)
MCH: 29.1 pg (ref 26.0–34.0)
MCHC: 30.1 g/dL (ref 30.0–36.0)
MCV: 96.7 fL (ref 78.0–100.0)
Monocytes Absolute: 0.6 10*3/uL (ref 0.1–1.0)
Monocytes Relative: 13 %
Neutro Abs: 3.5 10*3/uL (ref 1.7–7.7)
Neutrophils Relative %: 71 %
PLATELETS: 174 10*3/uL (ref 150–400)
RBC: 3.02 MIL/uL — AB (ref 4.22–5.81)
RDW: 17.2 % — AB (ref 11.5–15.5)
SMEAR REVIEW: ADEQUATE
WBC: 4.9 10*3/uL (ref 4.0–10.5)

## 2017-08-07 LAB — COMPREHENSIVE METABOLIC PANEL
ALK PHOS: 73 U/L (ref 38–126)
ALT: 16 U/L — ABNORMAL LOW (ref 17–63)
ANION GAP: 16 — AB (ref 5–15)
AST: 20 U/L (ref 15–41)
Albumin: 3.5 g/dL (ref 3.5–5.0)
BUN: 31 mg/dL — ABNORMAL HIGH (ref 6–20)
CALCIUM: 8 mg/dL — AB (ref 8.9–10.3)
CO2: 27 mmol/L (ref 22–32)
Chloride: 95 mmol/L — ABNORMAL LOW (ref 101–111)
Creatinine, Ser: 7.9 mg/dL — ABNORMAL HIGH (ref 0.61–1.24)
GFR calc Af Amer: 7 mL/min — ABNORMAL LOW (ref >60)
GFR calc non Af Amer: 6 mL/min — ABNORMAL LOW (ref >60)
Glucose, Bld: 82 mg/dL (ref 65–99)
Potassium: 4 mmol/L (ref 3.5–5.1)
SODIUM: 138 mmol/L (ref 135–145)
TOTAL PROTEIN: 8.2 g/dL — AB (ref 6.5–8.1)
Total Bilirubin: 0.8 mg/dL (ref 0.3–1.2)

## 2017-08-07 LAB — CBC
HEMATOCRIT: 26 % — AB (ref 39.0–52.0)
Hemoglobin: 8.2 g/dL — ABNORMAL LOW (ref 13.0–17.0)
MCH: 30.7 pg (ref 26.0–34.0)
MCHC: 31.5 g/dL (ref 30.0–36.0)
MCV: 97.4 fL (ref 78.0–100.0)
Platelets: 137 10*3/uL — ABNORMAL LOW (ref 150–400)
RBC: 2.67 MIL/uL — ABNORMAL LOW (ref 4.22–5.81)
RDW: 17.6 % — AB (ref 11.5–15.5)
WBC: 3.4 10*3/uL — ABNORMAL LOW (ref 4.0–10.5)

## 2017-08-07 LAB — I-STAT CG4 LACTIC ACID, ED
LACTIC ACID, VENOUS: 1.86 mmol/L (ref 0.5–1.9)
LACTIC ACID, VENOUS: 0.52 mmol/L (ref 0.5–1.9)

## 2017-08-07 LAB — PROTIME-INR
INR: 1.04
PROTHROMBIN TIME: 13.5 s (ref 11.4–15.2)

## 2017-08-07 LAB — CREATININE, SERUM
CREATININE: 9.26 mg/dL — AB (ref 0.61–1.24)
GFR calc Af Amer: 6 mL/min — ABNORMAL LOW (ref >60)
GFR calc non Af Amer: 5 mL/min — ABNORMAL LOW (ref >60)

## 2017-08-07 LAB — TROPONIN I: Troponin I: <0.03 ng/mL (ref <0.03)

## 2017-08-07 LAB — INFLUENZA PANEL BY PCR (TYPE A & B)
Influenza A By PCR: NEGATIVE
Influenza B By PCR: NEGATIVE

## 2017-08-07 IMAGING — DX DG CHEST 1V PORT
1 series · 1 of 1 positions shown · non-contrast
Comparison: [DATE]

CLINICAL DATA: Shortness of breath x1 week

EXAM:
PORTABLE CHEST 1 VIEW

[chest]
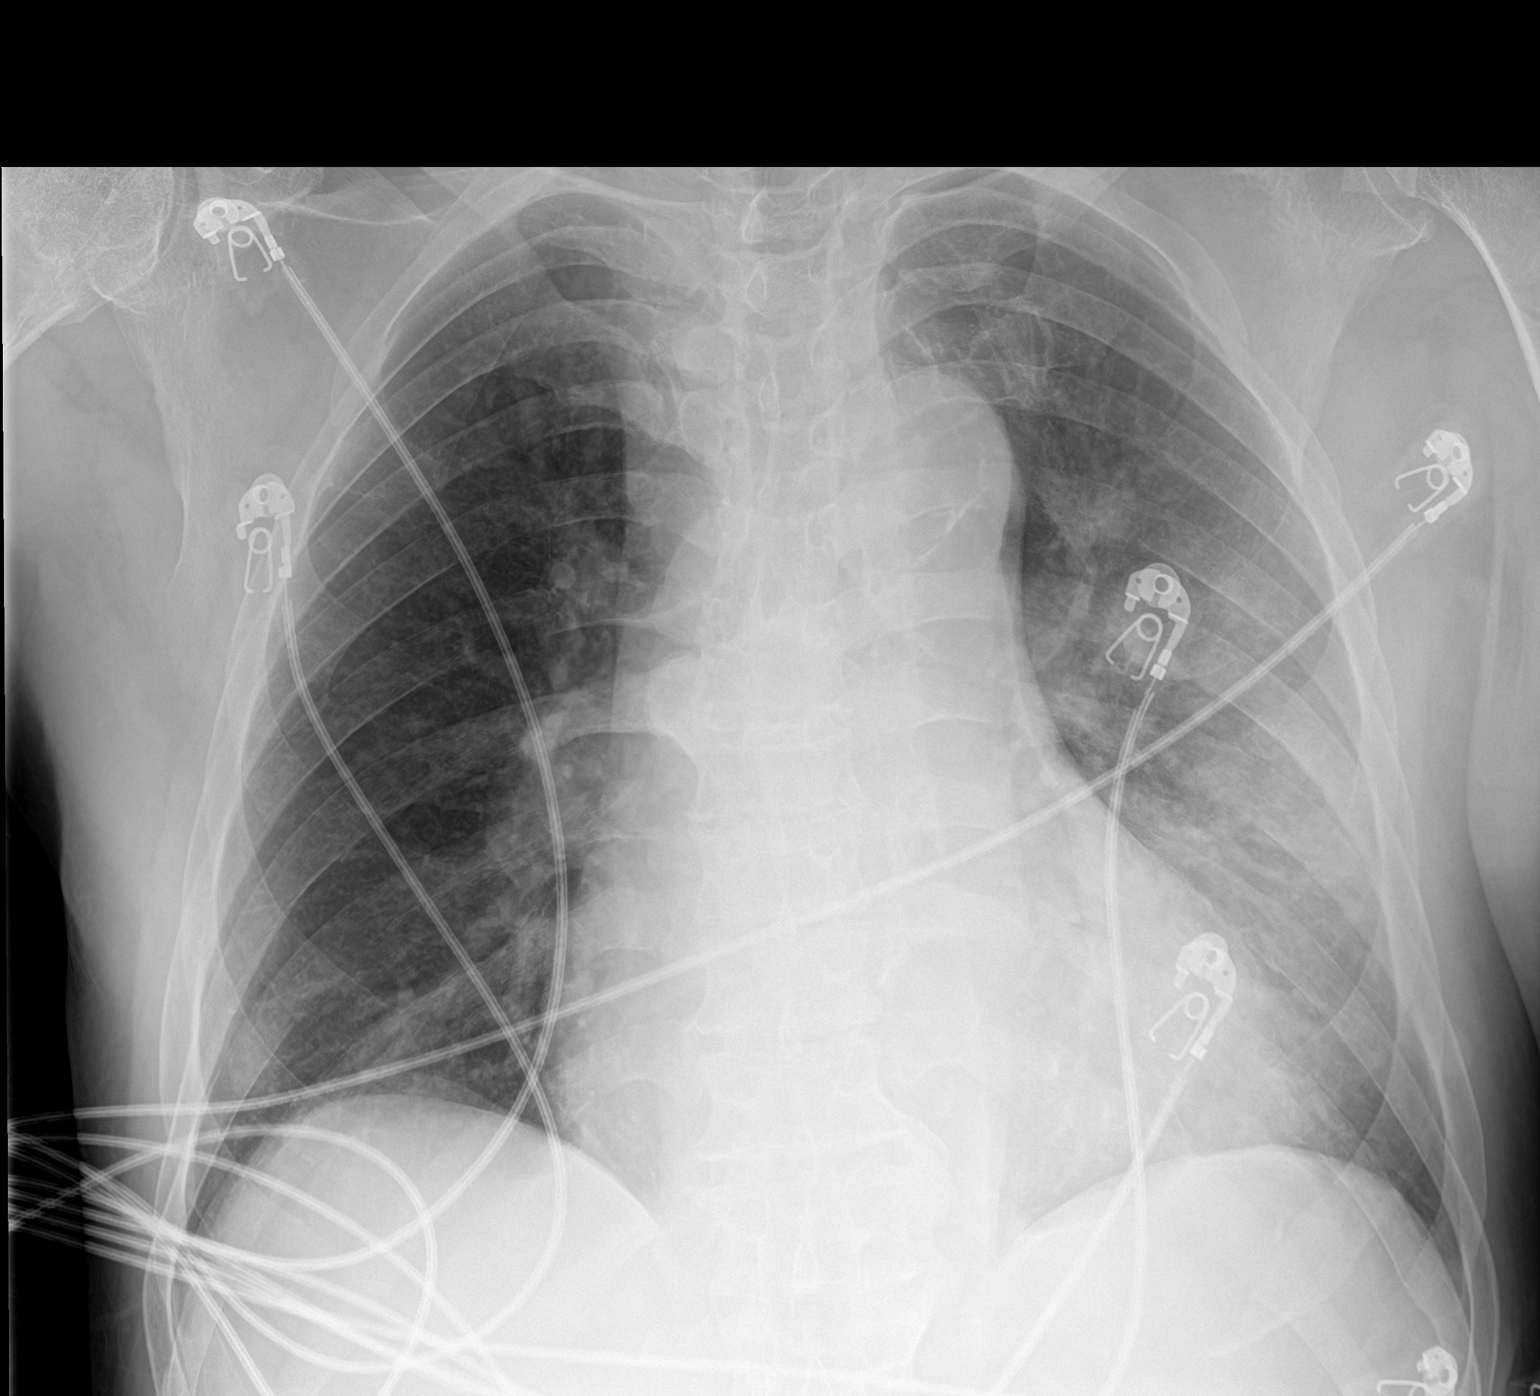

[1 of 1 positions shown; findings below may reference images not displayed]

FINDINGS: Hazy pulmonary opacities in the left lung/perihilar region, favoring
asymmetric interstitial edema with layering pleural effusion. Right
lung is essentially clear. No pneumothorax.

Cardiomegaly.
IMPRESSION: Hazy pulmonary opacities in the left lung/perihilar region, favoring
asymmetric interstitial edema with layering pleural effusion.

## 2017-08-07 MED ORDER — PREDNISOLONE ACETATE 1 % OP SUSP
1.0000 [drp] | Freq: Four times a day (QID) | OPHTHALMIC | Status: DC
  Administered 2017-08-07 – 2017-08-08 (×2): 1 [drp] via OPHTHALMIC
  Filled 2017-08-07: qty 1

## 2017-08-07 MED ORDER — METOPROLOL SUCCINATE ER 50 MG PO TB24
50.0000 mg | ORAL_TABLET | Freq: Every day | ORAL | Status: DC
  Administered 2017-08-07: 50 mg via ORAL
  Filled 2017-08-07: qty 1

## 2017-08-07 MED ORDER — AMLODIPINE BESYLATE 5 MG PO TABS
5.0000 mg | ORAL_TABLET | Freq: Every day | ORAL | Status: DC
  Administered 2017-08-08: 5 mg via ORAL
  Filled 2017-08-07: qty 1

## 2017-08-07 MED ORDER — CYCLOPENTOLATE HCL 1 % OP SOLN
1.0000 [drp] | Freq: Two times a day (BID) | OPHTHALMIC | Status: DC
  Administered 2017-08-07 – 2017-08-08 (×2): 1 [drp] via OPHTHALMIC
  Filled 2017-08-07: qty 2

## 2017-08-07 MED ORDER — VANCOMYCIN HCL IN DEXTROSE 750-5 MG/150ML-% IV SOLN: 750.0000 mg | INTRAVENOUS | Status: DC

## 2017-08-07 MED ORDER — TIMOLOL MALEATE 0.5 % OP SOLN
1.0000 [drp] | Freq: Two times a day (BID) | OPHTHALMIC | Status: DC
  Administered 2017-08-07 – 2017-08-08 (×2): 1 [drp] via OPHTHALMIC
  Filled 2017-08-07: qty 5

## 2017-08-07 MED ORDER — HEPARIN SODIUM (PORCINE) 5000 UNIT/ML IJ SOLN
5000.0000 [IU] | Freq: Three times a day (TID) | INTRAMUSCULAR | Status: DC
  Administered 2017-08-07 – 2017-08-08 (×2): 5000 [IU] via SUBCUTANEOUS
  Filled 2017-08-07: qty 1

## 2017-08-07 MED ORDER — PIPERACILLIN-TAZOBACTAM 3.375 G IVPB 30 MIN
3.3750 g | Freq: Once | INTRAVENOUS | Status: AC
  Administered 2017-08-07: 3.375 g via INTRAVENOUS
  Filled 2017-08-07: qty 50

## 2017-08-07 MED ORDER — VANCOMYCIN HCL 10 G IV SOLR
1750.0000 mg | Freq: Once | INTRAVENOUS | Status: AC
  Administered 2017-08-07: 1750 mg via INTRAVENOUS
  Filled 2017-08-07: qty 1750

## 2017-08-07 MED ORDER — OFLOXACIN 0.3 % OP SOLN
1.0000 [drp] | Freq: Four times a day (QID) | OPHTHALMIC | Status: DC
  Administered 2017-08-07 – 2017-08-08 (×2): 1 [drp] via OPHTHALMIC
  Filled 2017-08-07: qty 5

## 2017-08-07 MED ORDER — SEVELAMER CARBONATE 800 MG PO TABS
800.0000 mg | ORAL_TABLET | Freq: Three times a day (TID) | ORAL | Status: DC
  Administered 2017-08-08 (×2): 800 mg via ORAL
  Filled 2017-08-07 (×3): qty 1

## 2017-08-07 MED ORDER — ALBUTEROL SULFATE (2.5 MG/3ML) 0.083% IN NEBU: 2.5000 mg | INHALATION_SOLUTION | Freq: Four times a day (QID) | RESPIRATORY_TRACT | Status: DC | PRN

## 2017-08-07 NOTE — ED Notes (Signed)
Pt reports he has had a cough X 1 month

## 2017-08-07 NOTE — H&P (Signed)
Date: 08/07/2017               Patient Name:  Raymond [name] MRN: [id number]  DOB: [birth year] Age / Sex: 62 y.o., male   PCP: Scot Jun, FNP         Medical Service: Internal Medicine Teaching Service         Attending Physician: Dr. Valarie Merino, MD    First Contact: Dr. Thomasene Ripple Pager: [phone]  Second Contact: Dr. Ledell Noss Pager: [phone]       After Hours (After 5p/  First Contact Pager: [phone]  weekends / holidays): Second Contact Pager: [phone]   Chief Complaint: Chest pain, SOB  History of Present Illness:  Raymond [name] is a 62 yo with PMH of ESRD on HD MWF, HTN, and hepatitis C who presents for evaluation of chest pain and SOB. The patient states that since he missed dialysis last Friday. He missed dialysis last Friday because he overslept, since then the patient states that he noticed increased chest pain and shortness of breath, particularly with laying down. The patient describes his chest pain as sharp and located in the substernal region, with radiation throughout his right and left side. The chest pain isn't exertional and occurs when he breathes in and out. It has gotten worse since missing dialysis. It is accompanied with shortness of breath and cough with productive, clear-light green phlegm. His cough is intermittent and chronic (x1 month), possibly worse since missing dialysis. The shortness of breath is worse when laying flat and that it has gotten progressively worse since missing dialysis. He endorses subjective chills today, but denies fevers over the weekend. Since this constellation of symptoms began the patient has tried using his albuterol inhaler to help with chest tightness and SOB. This inhaler has not helped. He has experienced all of these symptoms on occasion before and states they usually occur when he misses dialysis. He went to dialysis today and states that they knew he had extra fluid on when he got there. He had an episode of  hypotension during dialysis when he got dizzy and they had to stop the session early (maybe by 45 minutes per chart review). The patient then was told to go to the ED for evaluation.   Upon arrival to the ED the patient was found to have an oral temp of 98.74F with repeat rectal temperature of 100.18F, he was non-tachycardic, normotensive, and saturating 100% on room air. His WBC count was 4.9 and lactic acid was 1.86 with repeat of 0.52. The patient's CMP showed creatinine of 7.9 and BUN of 31, which is consistent with historical values per chart review. Chest imaging showed diffuse hazy infiltrates in L lung, without significant change on the R, suggesting asymmetrical interstitial edema. The patient was started on broad spectrum antibiotics to treat possible HCAP, blood cultures were obtained, and IMTS was called for admission.   Meds:  Current Meds  Medication Sig  . albuterol (PROVENTIL HFA;VENTOLIN HFA) 108 (90 Base) MCG/ACT inhaler Inhale 2 puffs into the lungs every 6 (six) hours as needed for wheezing or shortness of breath.  [name] amLODipine (NORVASC) 5 MG tablet Take 1 tablet (5 mg total) by mouth daily.  . cyclopentolate (CYCLODRYL,CYCLOGYL) 1 % ophthalmic solution Place 1 drop into the right eye 2 (two) times daily.  [name] gabapentin (NEURONTIN) 300 MG capsule Take 1 capsule (300 mg total) by mouth at bedtime as needed (sleep).  . metoprolol succinate (TOPROL-XL) 25  MG 24 hr tablet Take 2 tablets (50 mg total) by mouth daily.  . Multiple Vitamin (MULTIVITAMIN WITH MINERALS) TABS tablet Take 1 tablet by mouth daily.  . naproxen sodium (ALEVE) 220 MG tablet Take 220 mg by mouth as needed (pain).  [name] ofloxacin (OCUFLOX) 0.3 % ophthalmic solution Place 1 drop into the right eye 4 (four) times daily.  . prednisoLONE acetate (PRED FORTE) 1 % ophthalmic suspension Place 1 drop into the right eye 4 (four) times daily.  . sevelamer (RENAGEL) 800 MG tablet Take 800 mg by mouth 3 (three) times daily with  meals.  . thiamine (VITAMIN B-1) 100 MG tablet Take 100 mg by mouth 3 (three) times a week.   . timolol (TIMOPTIC) 0.5 % ophthalmic solution Place 1 drop into the right eye 2 (two) times daily.   Allergies: Allergies as of 08/07/2017  . (No Known Allergies)   Past Medical History: Past Medical History:  Diagnosis Date  . Anemia   . Dyspnea    "with too much fluid"  . ESRD (end stage renal disease) on dialysis [facility])    "MWF; Jeneen Rinks" (07/22/17)  . Hemodialysis patient ([place])   . Hepatitis C    Still positive s/p liver biopsy at [facility]  and interferon therapy for 6 months. Most recent lab work was on 10/24/12  . Hepatitis C    "took the tx; gone now" (12/05/2016)  Was treated  . History of blood transfusion ~ 2012/2013   "related to my kidneys; blood was low"  . Hypertension   . Substance abuse ([place])   . Thyroid disease    Past Surgical History: Past Surgical History:  Procedure Laterality Date  . AV FISTULA PLACEMENT Left Aug. 2013 ?  [name] LIVER BIOPSY    . ORIF ULNAR FRACTURE Left 05/18/2017   Procedure: OPEN REDUCTION INTERNAL FIXATION (ORIF) ULNAR FRACTURE;  Surgeon: Iran Planas, MD;  Location: [place];  Service: Orthopedics;  Laterality: Left;  . SHUNTOGRAM N/A 12/11/2012   Procedure: Earney Mallet;  Surgeon: Serafina Mitchell, MD;  Location: [facility] CATH LAB;  Service: Cardiovascular;  Laterality: N/A;   Family History:  Family History  Problem Relation Age of Onset  . Diabetes Father   . Hypertension Father    Social History:  Social History   Tobacco Use  . Smoking status: Current Every Day Smoker    Packs/day: 1.00    Years: 25.00    Pack years: 25.00    Types: Cigarettes    Last attempt to quit: 08/01/2011    Years since quitting: 6.0  . Smokeless tobacco: Never Used  Substance Use Topics  . Alcohol use: Yes    Comment: occasionally  . Drug use: Yes    Types: Cocaine    Comment: clean x approx 2 months (07/22/17)   Review of Systems: A complete ROS was negative  except as per HPI.   Physical Exam: Blood pressure (!) 141/84, pulse 94, temperature 100.2 F (37.9 C), temperature source Rectal, resp. rate 19, height 5\' 8"  (1.727 m), weight 160 lb (72.6 kg), SpO2 100 %.  Physical Exam  Constitutional: He appears well-developed and well-nourished. No distress.  HENT:  Mouth/Throat: Oropharynx is clear and moist. No oropharyngeal exudate.  Cardiovascular: Normal rate, regular rhythm and intact distal pulses. Exam reveals no friction rub.  No murmur heard. Respiratory:  No accessory muscle use or nasal flaring. Speaking comfortably in full sentences. No coughing during exam. Lungs clear to auscultation bilaterally without wheezing or crackles.  GI: Soft. He exhibits no distension. There is no tenderness. There is no rebound.  Musculoskeletal: He exhibits no edema (of bilateral lower extremitites) or tenderness (of bilateral lower extremitites).  Lymphadenopathy:    He has no cervical adenopathy.  Skin: Skin is warm and dry. No rash noted. No erythema.   EKG: personally reviewed my interpretation is sinus tachycardia without ST elevation or TWI to suggest ischemia.   CXR: personally reviewed my interpretation is increased haziness throughout L lobe relative to R, new from previous imaging. No significant pleural effusions noted.   Assessment & Plan by Problem: Active Problems:   * No active hospital problems. *  Raymond [name] is a 62 yo with PMH of ESRD on HD MWF, HTN, and hepatitis C who presents for evaluation of progressively worsening chest pain and SOB that started after missing dialysis a few days prior to presentation. His workup in the ED was initially concerning for pneumonia, but upon further consideration his symptoms are more consistent with volume overload. He was admitted to the internal medicine teaching service for management. The specific problems addressed during admission were as follows:  SOB and pleuritic chest pain, concerning for  volume overload vs pneumonia: Patient's presenting symptoms of SOB and chest pain occurred in the setting of missing dialysis a few days prior to admission. Both of these symptoms have progressively worsened leading up to dialysis today. The symptoms seem to have improved after dialysis, even though his session was reportedly abbreviated 2/2 hypotension. His chest pain sounds pleuritic in nature, as it is positional, occurs at rest, and feels worse with inspiration. Patient has evidence of volume overload on imaging (L interstitial lung edema) but does not have overt signs of on physical exam to suggest emergent dialysis needed. He does have some infectious symptoms, including subjective chills and productive cough. He does not, however, have a fever, leukocytosis, or lobar infiltrate on chest imaging to suggest pneumonia. Patient states that he has a history of COPD for which someone prescribed him an albuterol inhaler. No signs of wheezing on exam today to suggest COPD exacerbation or that patient would benefit from additional breathing treatments outside of usual home regimen. Will discontinue antibiotics started in ED and observe patient overnight to see if he spikes fever. Will also plan to repeat imaging with 2 view chest X ray in AM to better evaluate for effusions and/or infiltrations. Will also cycle troponin levels given endorsement of acute onset chest pain since patient has risk factors for ACS.  -Discontinue antibiotics started in ED, clinically observe for fever -F/u blood cultures in ED -F/u influenza testing in ED -F/u troponin levels overnight -Repeat chest imaging with 2 view CXR in AM  ESRD on HD MWF: As discussed above, patient does not have significant volume overload on exam and breathing comfortably on room air. If patient does not improve overnight will consult nephrology -K+ within normal limits -Continue to monitor  HTN: Patient's BP slightly elevated in ED (140s/80s during  exam). Will provide patient's home regimen of amlodipine 5 mg and metoprolol succinate 50 mg daily while inpatient. -Amlodipine 5 mg, metoprolol XL 50 mg   FEN/GI: -Renal diet with 1.2L volume restriction -No IVF, replace electrolytes as needed  VTE Prophylaxis: Heparin TID Code Status: Full  Dispo: Admit patient to Observation with expected length of stay less than 2 midnights.  SignedThomasene Ripple, MD 08/07/2017, 5:45 PM  Pager: [phone]

## 2017-08-07 NOTE — ED Triage Notes (Signed)
Pt coming from dialysis. Complaint of shortness of breath X1 week. Pt missed HD treatment on Friday. When dialysis center opened he was found by staff to be laying on the ground outside complaining of shortness of breath. He also reports abdominal pain. 12 lead unremarkable, spo2 98% on 2L of O2. HR 103, CBG 101, 20rr, 156/111.

## 2017-08-07 NOTE — ED Notes (Signed)
Pt reports he feels that his vision is blurry. He states that he had eye surgery last week. MD made aware of patient complaints.

## 2017-08-07 NOTE — Progress Notes (Signed)
Pharmacy Antibiotic Note  Raymond [name] is a 62 y.o. male admitted on 08/07/2017 with fever and shortness of breath.  PMH includes ESRD with HD M,W,F and missed HD on Friday.    Pharmacy has been consulted for vancomycin dosing.  Plan: -Vancomycin load of 1750 mg  -Vancomycin 750 mg post HD - Monitor clinical progression and pre-HD level as needed -Follow up HD schedule  Temp (24hrs), Avg:99.4 F (37.4 C), Min:98.6 F (37 C), Max:100.2 F (37.9 C)  No results for input(s): WBC, CREATININE, LATICACIDVEN, VANCOTROUGH, VANCOPEAK, VANCORANDOM, GENTTROUGH, GENTPEAK, GENTRANDOM, TOBRATROUGH, TOBRAPEAK, TOBRARND, AMIKACINPEAK, AMIKACINTROU, AMIKACIN in the last 168 hours.  CrCl cannot be calculated (Patient's most recent lab result is older than the maximum 21 days allowed.).    No Known Allergies  Antimicrobials this admission: 1/28 vancomycin >>    Dose adjustments this admission: N/A  Microbiology results: 1/28  BCx: x 2 pending  Thank you for allowing pharmacy to be a part of this patient's care.  Deboraha Sprang, PharmD 08/07/2017 1:17 PM

## 2017-08-07 NOTE — ED Provider Notes (Signed)
[facility] EMERGENCY DEPARTMENT Provider Note   CSN: [id number] Arrival date & time: 08/07/17  1208     History   Chief Complaint Chief Complaint  Patient presents with  . Shortness of Breath    HPI Raymond [name] is a 62 y.o. male.  62 year old male with prior history of end-stage renal disease on hemodialysis, hep C, hypertension, and history of substance abuse presents for evaluation of fever and chills.  Patient apparently presented to the dialysis center this morning for dialysis.  He was complaining of shortness of breath and chills.  During dialysis he became more uncomfortable and then was sent to the ED.  Patient completed his entire dialysis session except for the last 45 minutes.  On arrival to the ED complains of shortness of breath and chills.  His rectal temperature is noted to be at 100.2.  He denies abdominal pain.  He denies chest pain.  He reports that he has been feeling poorly for the last 2-3 days.   The history is provided by the patient.    Past Medical History:  Diagnosis Date  . Anemia   . Dyspnea    "with too much fluid"  . ESRD (end stage renal disease) on dialysis [facility])    "MWF; Jeneen Rinks" (07/22/17)  . Hemodialysis patient ([place])   . Hepatitis C    Still positive s/p liver biopsy at [facility]  and interferon therapy for 6 months. Most recent lab work was on 10/24/12  . Hepatitis C    "took the tx; gone now" (12/05/2016)  Was treated  . History of blood transfusion ~ 2012/2013   "related to my kidneys; blood was low"  . Hypertension   . Substance abuse ([place])   . Thyroid disease     Patient Active Problem List   Diagnosis Date Noted  . Acute pulmonary edema (HCC)   . Dyspnea 06/20/2017  . Acute bronchitis   . COPD exacerbation ([place])   . End-stage renal disease on hemodialysis ([place])   . Hypoxia 12/05/2016  . Hyperkalemia 12/19/2015  . Hypoglycemia 12/19/2015  . Polysubstance abuse ([place]) 12/19/2015  . Homelessness 12/19/2015   . Anemia associated with stage 5 chronic renal failure ([place]) 12/19/2015  . Atypical chest pain 12/19/2013  . Pain in limb 12/04/2012  . End stage renal disease ([place]) 12/04/2012  . HEPATITIS C 09/07/2006  . HYPERCHOLESTEROLEMIA 09/07/2006  . HYPERTENSION, BENIGN SYSTEMIC 09/07/2006    Past Surgical History:  Procedure Laterality Date  . AV FISTULA PLACEMENT Left Aug. 2013 ?  [name] LIVER BIOPSY    . ORIF ULNAR FRACTURE Left 05/18/2017   Procedure: OPEN REDUCTION INTERNAL FIXATION (ORIF) ULNAR FRACTURE;  Surgeon: Iran Planas, MD;  Location: [facility];  Service: Orthopedics;  Laterality: Left;  . SHUNTOGRAM N/A 12/11/2012   Procedure: Earney Mallet;  Surgeon: Serafina Mitchell, MD;  Location: [facility] CATH LAB;  Service: Cardiovascular;  Laterality: N/A;       Home Medications    Prior to Admission medications   Medication Sig Start Date End Date Taking? Authorizing Provider  albuterol (PROVENTIL HFA;VENTOLIN HFA) 108 (90 Base) MCG/ACT inhaler Inhale 2 puffs into the lungs every 6 (six) hours as needed for wheezing or shortness of breath. 12/06/16  Yes Lorella Nimrod, MD  amLODipine (NORVASC) 5 MG tablet Take 1 tablet (5 mg total) by mouth daily. 07/13/17  Yes Scot Jun, FNP  cyclopentolate (CYCLODRYL,CYCLOGYL) 1 % ophthalmic solution Place 1 drop into the right eye 2 (two) times  daily. 07/22/17  Yes Varney Biles, MD  gabapentin (NEURONTIN) 300 MG capsule Take 1 capsule (300 mg total) by mouth at bedtime as needed (sleep). 07/13/17  Yes Scot Jun, FNP  metoprolol succinate (TOPROL-XL) 25 MG 24 hr tablet Take 2 tablets (50 mg total) by mouth daily. 07/13/17  Yes Scot Jun, FNP  Multiple Vitamin (MULTIVITAMIN WITH MINERALS) TABS tablet Take 1 tablet by mouth daily. 12/07/16  Yes Lorella Nimrod, MD  prednisoLONE acetate (PRED FORTE) 1 % ophthalmic suspension Place 1 drop into the right eye 4 (four) times daily. 07/22/17  Yes Varney Biles, MD  sevelamer (RENAGEL) 800 MG tablet Take 800 mg  by mouth 3 (three) times daily with meals.   Yes [provider]  thiamine (VITAMIN B-1) 100 MG tablet Take 100 mg by mouth 3 (three) times a week.    Yes [provider]  acetaminophen (TYLENOL) 325 MG tablet Take 2 tablets (650 mg total) by mouth every 6 (six) hours as needed. 07/22/17   Varney Biles, MD    Family History Family History  Problem Relation Age of Onset  . Diabetes Father   . Hypertension Father     Social History Social History   Tobacco Use  . Smoking status: Current Every Day Smoker    Packs/day: 1.00    Years: 25.00    Pack years: 25.00    Types: Cigarettes    Last attempt to quit: 08/01/2011    Years since quitting: 6.0  . Smokeless tobacco: Never Used  Substance Use Topics  . Alcohol use: Yes    Comment: occasionally  . Drug use: Yes    Types: Cocaine    Comment: clean x approx 2 months (07/22/17)     Allergies   Patient has no known allergies.   Review of Systems Review of Systems  Constitutional: Positive for chills and fever.  Respiratory: Positive for shortness of breath.   All other systems reviewed and are negative.    Physical Exam Updated Vital Signs BP (!) 138/92   Pulse 100   Temp 100.2 F (37.9 C) (Rectal)   Resp (!) 21   Ht 5\' 8"  (1.727 m) Comment: patient reported  Wt 72.6 kg (160 lb) Comment: patient reported  SpO2 96%   BMI 24.33 kg/m   Physical Exam  Constitutional: He is oriented to person, place, and time. He appears well-developed and well-nourished. No distress.  HENT:  Head: Normocephalic and atraumatic.  Mouth/Throat: Oropharynx is clear and moist.  Eyes: Conjunctivae and EOM are normal. Pupils are equal, round, and reactive to light.  Neck: Normal range of motion. Neck supple.  Cardiovascular: Normal rate, regular rhythm and normal heart sounds.  Pulmonary/Chest: Effort normal and breath sounds normal. No respiratory distress.  Abdominal: Soft. He exhibits no distension. There is no  tenderness.  Musculoskeletal: Normal range of motion. He exhibits no edema or deformity.  Neurological: He is alert and oriented to person, place, and time.  Skin: Skin is warm and dry.  Psychiatric: He has a normal mood and affect.  Nursing note and vitals reviewed.    ED Treatments / Results  Labs (all labs ordered are listed, but only abnormal results are displayed) Labs Reviewed  COMPREHENSIVE METABOLIC PANEL - Abnormal; Notable for the following components:      Result Value   Chloride 95 (*)    BUN 31 (*)    Creatinine, Ser 7.90 (*)    Calcium 8.0 (*)    Total Protein  8.2 (*)    ALT 16 (*)    GFR calc non Af Amer 6 (*)    GFR calc Af Amer 7 (*)    Anion gap 16 (*)    All other components within normal limits  CBC WITH DIFFERENTIAL/PLATELET - Abnormal; Notable for the following components:   RBC 3.02 (*)    Hemoglobin 8.8 (*)    HCT 29.2 (*)    RDW 17.2 (*)    All other components within normal limits  CULTURE, BLOOD (ROUTINE X 2)  CULTURE, BLOOD (ROUTINE X 2)  PROTIME-INR  I-STAT CG4 LACTIC ACID, ED  I-STAT CG4 LACTIC ACID, ED    EKG  EKG Interpretation  Date/Time:  Monday August 07 2017 12:25:37 EST Ventricular Rate:  101 PR Interval:    QRS Duration: 89 QT Interval:  420 QTC Calculation: 545 R Axis:   46 Text Interpretation:  Sinus tachycardia Ventricular premature complex Nonspecific T abnrm, anterolateral leads Prolonged QT interval Artifact in lead(s) I III aVL V2 Confirmed by Dene Gentry [phone]) on 08/07/2017 12:52:39 PM Also confirmed by Dene Gentry [phone]), editor Philomena Doheny [phone])  on 08/07/2017 1:35:48 PM       Radiology Dg Chest Port 1 View  Result Date: 08/07/2017 CLINICAL DATA:  Shortness of breath x1 week EXAM: PORTABLE CHEST 1 VIEW COMPARISON:  06/20/2017 FINDINGS: Hazy pulmonary opacities in the left lung/perihilar region, favoring asymmetric interstitial edema with layering pleural effusion. Right lung is essentially clear. No  pneumothorax. Cardiomegaly. IMPRESSION: Hazy pulmonary opacities in the left lung/perihilar region, favoring asymmetric interstitial edema with layering pleural effusion. Electronically Signed   By: Julian Hy M.D.   On: 08/07/2017 14:08    Procedures Procedures (including critical care time)  Medications Ordered in ED Medications  vancomycin (VANCOCIN) 1,750 mg in sodium chloride 0.9 % 500 mL IVPB (1,750 mg Intravenous New Bag/Given 08/07/17 1430)  vancomycin (VANCOCIN) IVPB 750 mg/150 ml premix (not administered)  piperacillin-tazobactam (ZOSYN) IVPB 3.375 g (0 g Intravenous Stopped 08/07/17 1340)     Initial Impression / Assessment and Plan / ED Course  I have reviewed the triage vital signs and the nursing notes.  Pertinent labs & imaging results that were available during my care of the patient were reviewed by me and considered in my medical decision making (see chart for details).     MDM screen complete  Patient is presenting with complaint of shortness of breath,chills, fever, and is on hemodialysis.  I am concerned that his presentation may be consistent with pneumonia.  Pateint given broad-spectrum antibiotics upon initial arrival.  Case discussed with admitting medicine service who will evaluate the patient for admission.  Final Clinical Impressions(s) / ED Diagnoses   Final diagnoses:  SOB (shortness of breath)  Fever, unspecified fever cause  ESRD (end stage renal disease) [facility])    ED Discharge Orders    None       Valarie Merino, MD 08/07/17 1606

## 2017-08-08 ENCOUNTER — Other Ambulatory Visit: Payer: Self-pay

## 2017-08-08 DIAGNOSIS — N186 End stage renal disease: Secondary | ICD-10-CM | POA: Diagnosis not present

## 2017-08-08 DIAGNOSIS — I120 Hypertensive chronic kidney disease with stage 5 chronic kidney disease or end stage renal disease: Secondary | ICD-10-CM | POA: Diagnosis not present

## 2017-08-08 DIAGNOSIS — B1920 Unspecified viral hepatitis C without hepatic coma: Secondary | ICD-10-CM

## 2017-08-08 DIAGNOSIS — Z992 Dependence on renal dialysis: Secondary | ICD-10-CM

## 2017-08-08 DIAGNOSIS — Z79899 Other long term (current) drug therapy: Secondary | ICD-10-CM

## 2017-08-08 DIAGNOSIS — E8770 Fluid overload, unspecified: Secondary | ICD-10-CM | POA: Diagnosis not present

## 2017-08-08 LAB — TROPONIN I
Troponin I: <0.03 ng/mL (ref <0.03)
Troponin I: <0.03 ng/mL (ref <0.03)

## 2017-08-08 LAB — MRSA PCR SCREENING: MRSA by PCR: NEGATIVE

## 2017-08-08 NOTE — Care Management Obs Status (Signed)
[facility] NOTIFICATION   Patient Details  Name: Raymond [name] MRN: [id number] Date of Birth: [birth year]   Medicare Observation Status Notification Given:  Yes  Kristen Cardinal, RN 08/08/2017, 12:16 PM

## 2017-08-08 NOTE — Progress Notes (Signed)
Received report from ED RN. Room ready for patient. Alvira Hecht Joselita, RN 

## 2017-08-08 NOTE — Discharge Summary (Signed)
Name: Raymond [name] MRN: [id number] DOB: 1956/02/10 62 y.o. PCP: Scot Jun, FNP  Date of Admission: 08/07/2017 12:08 PM Date of Discharge: 08/08/2017 Attending Physician: Aldine Contes, MD  Discharge Diagnosis: Active Problems:   Volume overload  Discharge Medications: Allergies as of 08/08/2017   No Known Allergies     Medication List    STOP taking these medications   naproxen sodium 220 MG tablet Commonly known as:  ALEVE     TAKE these medications   acetaminophen 325 MG tablet Commonly known as:  TYLENOL Take 2 tablets (650 mg total) by mouth every 6 (six) hours as needed.   albuterol 108 (90 Base) MCG/ACT inhaler Commonly known as:  PROVENTIL HFA;VENTOLIN HFA Inhale 2 puffs into the lungs every 6 (six) hours as needed for wheezing or shortness of breath.   amLODipine 5 MG tablet Commonly known as:  NORVASC Take 1 tablet (5 mg total) by mouth daily.   cyclopentolate 1 % ophthalmic solution Commonly known as:  CYCLODRYL,CYCLOGYL Place 1 drop into the right eye 2 (two) times daily.   gabapentin 300 MG capsule Commonly known as:  NEURONTIN Take 1 capsule (300 mg total) by mouth at bedtime as needed (sleep).   metoprolol succinate 25 MG 24 hr tablet Commonly known as:  TOPROL-XL Take 2 tablets (50 mg total) by mouth daily.   multivitamin with minerals Tabs tablet Take 1 tablet by mouth daily.   ofloxacin 0.3 % ophthalmic solution Commonly known as:  OCUFLOX Place 1 drop into the right eye 4 (four) times daily.   prednisoLONE acetate 1 % ophthalmic suspension Commonly known as:  PRED FORTE Place 1 drop into the right eye 4 (four) times daily.   sevelamer 800 MG tablet Commonly known as:  RENAGEL Take 800 mg by mouth 3 (three) times daily with meals.   thiamine 100 MG tablet Commonly known as:  VITAMIN B-1 Take 100 mg by mouth 3 (three) times a week.   timolol 0.5 % ophthalmic solution Commonly known as:  TIMOPTIC Place 1 drop into the  right eye 2 (two) times daily.       Disposition and follow-up:   RaymondAbdimalik E [name] was discharged from [facility] in Stable condition.  At the hospital follow up visit please address:  1.  Patient admitted for chest pain and SOB which progressively worsened after missing dialysis prior to presentation. Patient had dialysis the day of presentation, but underwent an abbreviated session 2/2 symptomatic hypotension during session. Patient presented to ED for evaluation after dialysis and his workup was most consistent with volume overload as a cause of his symptoms. No urgent indication for dialysis while inpatient. Patient discharged with regular dialysis follow up.   Please assess for signs and symptoms of volume overload on exam. Please assess if patient's subjective SOB and chest pain have continued to improve after regularly scheduled dialysis sessions.   2.  Labs / imaging needed at time of follow-up: None  3.  Pending labs/ test needing follow-up: Blood cultures x2  Follow-up Appointments: Follow-up Information    Scot Jun, FNP. Schedule an appointment as soon as possible for a visit in 1 week(s).   Specialty:  Family Medicine Contact information: [place] [address] [place] Hospital Course by problem list: Active Problems:   Volume overload   Raymond [name] is a 62 yo with PMH of ESRD on HD MWF, HTN,  and hepatitis C who presented for evaluation of progressively worsening chest pain and SOB that started after missing dialysis a few days prior to presentation. He was admitted to the internal medicine teaching service for management. The specific problems addressed during admission were as follows:  SOB and pleuritic chest pain, concerning for volume overload: Patient's symptoms of SOB and chest pain occurred in the setting of missing dialysis a few days prior to admission. Both of these symptoms progressively worsened  leading up to admission and were subjectively improved after dialysis on the day of presentation. Patient had evidence of volume overload on imaging (L interstitial lung edema) but not overt signs of volume overload on exam or new oxygen requirement to suggest emergent dialysis needed. There was initial concern for infectious etiology of the patient's SOB by the ED provider, given patient's report of pleuritic chest pain, productive cough, subjective chills during dialysis the day of presentation, and possible of low-grade fever on admission (rectal temp 100.2). He was influenza A/B negative by PCR. Patient given antibiotics in ED to treat pneumonia, but these were discontinued on admission as the patient's physical exam and overall clinical picture was more consistent with volume overload rather than infectious etiology.   Patient's initial complaint of chest pain during dialysis was concerning for ACS, given his age, risk factors, and acute onset of sharp chest pain. Upon further questioning, however, the patient's pain occurred with inspiration and seemed to be more pleuritic in nature. Overnight his repeat troponin levels remained undetectable and there were no EKG changes to suggest ACS, further confirming that chest pain pleuritic in nature and more likely related to volume overload.  ESRD on HD MWF: Patient arrived after reportedly abbreviated dialysis session on Monday, which was cut short 2/2 symptomatic hypotension. Patient missed dialysis on the Friday prior to the session that occurred on day of presentation. Suspect they noted volume overload at beginning of session and patient did not tolerate increased fluid removal during session. No clinical symptoms or electrolyte abnormalities which necessitated emergent dialysis while inpatient. Patient discharged with regularly scheduled dialysis follow up as outpatient.   HTN: Patient was normotensive with periods of intermittent hypertension while  inpatient. Suspect hypertension 2/2 volume overload. Patient provided with home regimen of amlodipine 5 mg and metoprolol succinate 50 mg daily and told to follow up with PCP regarding further management.  Discharge Vitals:   BP 128/75 (BP Location: Right Arm)   Pulse 84   Temp 98.6 F (37 C) (Oral)   Resp 20   Ht 5\' 8"  (1.727 m)   Wt 161 lb 9.6 oz (73.3 kg)   SpO2 100%   BMI 24.57 kg/m   Pertinent Labs, Studies, and Procedures:   BMP Latest Ref Rng & Units 08/07/2017 08/07/2017 07/13/2017  Glucose 65 - 99 mg/dL - 82 116(H)  BUN 6 - 20 mg/dL - 31(H) 33(H)  Creatinine 0.61 - 1.24 mg/dL 9.26(H) 7.90(H) 10.62(H)  BUN/Creat Ratio 10 - 24 - - 3(L)  Sodium 135 - 145 mmol/L - 138 139  Potassium 3.5 - 5.1 mmol/L - 4.0 5.1  Chloride 101 - 111 mmol/L - 95(L) 101  CO2 22 - 32 mmol/L - 27 29  Calcium 8.9 - 10.3 mg/dL - 8.0(L) 7.6(L)   CBC Latest Ref Rng & Units 08/07/2017 08/07/2017 07/13/2017  WBC 4.0 - 10.5 K/uL 3.4(L) 4.9 2.5(LL)  Hemoglobin 13.0 - 17.0 g/dL 8.2(L) 8.8(L) 11.2(L)  Hematocrit 39.0 - 52.0 % 26.0(L) 29.2(L) 36.0(L)  Platelets 150 - 400  K/uL 137(L) 174 189   Lactic Acid = 1.86, 0.52  Influenza A/B PCR = Negative MRSA PCR = Negative  Troponin q6 hours x 3 =  <0.03  Blood cultures x2: Culture, blood (routine x 2) [[id number]] Collected: 08/07/17 1235  Specimen: Blood Updated: 08/08/17 0940   Specimen Description BLOOD RIGHT ANTECUBITAL   Special Requests BOTTLES DRAWN AEROBIC AND ANAEROBIC Blood Culture adequate volume   Culture NO GROWTH < 24 HOURS   Report Status PENDING   Chest X Ray 08/07/2017 FINDINGS: Hazy pulmonary opacities in the left lung/perihilar region, favoring asymmetric interstitial edema with layering pleural effusion. Right lung is essentially clear. No pneumothorax.  Cardiomegaly.  IMPRESSION: Hazy pulmonary opacities in the left lung/perihilar region, favoring asymmetric interstitial edema with layering pleural effusion.  Discharge  Instructions: Discharge Instructions    Call MD for:  difficulty breathing, headache or visual disturbances   Complete by:  As directed    Call MD for:  temperature >100.4   Complete by:  As directed    Diet - low sodium heart healthy   Complete by:  As directed    Increase activity slowly   Complete by:  As directed      Signed: Thomasene Ripple, MD 08/08/2017, 11:56 AM   Pager: [phone]

## 2017-08-08 NOTE — Progress Notes (Signed)
  Date: 08/08/2017  Patient name: Raymond [name]  Medical record number: [id number]  Date of birth: 1955-08-13   I have seen and evaluated Raymond [name] and discussed their care with the Residency Team.  In brief, patient is a 61 year old male with a past medical history of end-stage renal disease on hemodialysis, hypertension and hepatitis C who presented to the ED with chest pain and shortness of breath.  Patient states that he missed his dialysis session on Friday and since then has noticed increased chest pain and shortness of breath particularly when laying down.  Chest pain is sharp, substernal with radiation to the right and left side.  Chest pain is not related to exertion but appears to be pleuritic in nature.  Patient also complained of associated cough which is productive of clear phlegm and worsening shortness of breath with subjective chills.  No fevers, no nausea or vomiting, no palpitations, no diaphoresis, no lightheadedness, no syncope, no abdominal pain, no headache, no blurry vision, no focal weakness.  Patient went to dialysis yesterday and was told he had extra fluid that they would attempt to remove.  During HD patient developed an episode of hypotension and became lightheaded and had to stop session early.  He came to the ED for further evaluation.  Today patient states that he feels better with improvement in his chest pain and shortness of breath.  PMHx, Fam Hx, and/or Soc Hx : As per resident admit note  Vitals:   08/08/17 0514 08/08/17 0932  BP: (!) 150/91 128/75  Pulse: 88 84  Resp: 20   Temp: 98.2 F (36.8 C) 98.6 F (37 C)  SpO2: 100% 100%   General: Awake alert and oriented x3, NAD CVS: Regular rate and rhythm, normal heart sounds Lungs: CTA bilaterally Abdomen: Soft, nontender, nondistended, normoactive bowel sounds Extremities: No edema noted  Assessment and Plan: I have seen and evaluated the patient as outlined above. I agree with the formulated  Assessment and Plan as detailed in the residents' note, with the following changes:   1.  Worsening shortness of breath secondary to volume overload: -Patient presented from hemodialysis yesterday with worsening shortness of breath and chest pain. Patient with likely volume overload secondary to his missed hemodialysis session on Friday and having to terminate his hemodialysis session early yesterday.  Chest x-ray consistent with asymmetric interstitial edema.  Patient no evidence of an underlying infection (no fevers, normal white count and chest x-ray consistent with edema). -Patient to follow-up at his hemodialysis center tomorrow for hemodialysis -Patient was started on broad-spectrum antibody to the ED which were discontinued as he has no signs of an active infection -Influenza testing was negative -Patient is also noted to have chest pain on admission likely secondary to volume overload.  Chest pain has since resolved and his troponins have been negative x3 sets.  EKG with no acute ST/T wave changes -No further workup for now. -Patient stable for discharge home today  Aldine Contes, MD 1/29/201911:19 AM

## 2017-08-08 NOTE — Progress Notes (Signed)
Discharge instructions discussed and reviewed with pt, verbalized understanding. Copy of AVS given as well. Pt was escorted out of the unit in wheelchair. Bus pass given to pt. Took all belongings with him.

## 2017-08-08 NOTE — Social Work (Cosign Needed)
CSW intern was informed that pt is medically ready for discharge and requested a cab voucher.  CSW intern later informed by nurse that Raymond [name] was now requesting a bus pass. Bus pass was given to pt. CSW intern signing off as no other social work intervention services needed.  Massie Kluver, [place] intern

## 2017-08-08 NOTE — Care Management Note (Signed)
Case Management Note  Patient Details  Name: Raymond [name] MRN: [id number] Date of Birth: [birth year]  Subjective/Objective:  Admitted for fluid overload.          Action/Plan: In to speak to patient. Prior to admission patient lived at home with sister.  PCP is Molli Barrows.  Uses [facility] on [employer]. Home DME: none.  Patient denies inability to afford food or medications.  Patient states sister is picking him up today.  Usually takes bus or friend drives him to medical appointments.  Patient is independent of all ADL's.  Patient states he missed HD appointment because he overslept.  NCM encouraged patient to set daily alarm reminders on the days he has HD scheduled.  Patient verbalized understanding.  Patient for discharge home today.  No discharge needs noted.  Expected Discharge Date:  08/08/17               Expected Discharge Plan:  Home/Self Care  Discharge planning Services  CM Consult  Status of Service:  Completed, signed off  Kristen Cardinal, RN  Nurse case manager [phone] 08/08/2017, 12:20 PM

## 2017-08-08 NOTE — Progress Notes (Signed)
   Subjective:  Patient seen laying comfortably in bed in no acute distress. Patient states that he feels improved from admission, citing improvement in cough and chest pain overnight. Patient still has SOB, but states that this is improved overnight and with dialysis yesterday.  Objective:  Vital signs in last 24 hours: Vitals:   08/07/17 2318 08/08/17 0031 08/08/17 0514 08/08/17 0932  BP: (!) 143/89 (!) 169/97 (!) 150/91 128/75  Pulse: 90 85 88 84  Resp:  (!) 21 20   Temp:  98.5 F (36.9 C) 98.2 F (36.8 C) 98.6 F (37 C)  TempSrc:  Oral Oral Oral  SpO2:  100% 100% 100%  Weight:  161 lb 9.6 oz (73.3 kg)    Height:  5\' 8"  (1.727 m)     Physical Exam  Constitutional: He appears well-developed and well-nourished. No distress.  HENT:  Mouth/Throat: Oropharynx is clear and moist. No oropharyngeal exudate.  Cardiovascular: Normal rate, regular rhythm and intact distal pulses. Exam reveals no friction rub.  No murmur heard. Respiratory: Effort normal. No respiratory distress. He has no wheezes. He has no rales.  No accessory muscle use or nasal flaring  GI: Soft. Bowel sounds are normal. He exhibits no distension. There is no tenderness. There is no rebound.  Musculoskeletal: He exhibits no edema (of bilateral lower extremities) or tenderness (of bilateral lower extremities).  Skin: Skin is warm and dry. No rash noted. No erythema.  Palpable thrill on fistula   Assessment/Plan:  Active Problems:   Volume overload  Raymond [name] is a 62 yo with PMH of ESRD on HD MWF, HTN, and hepatitis C who presents for evaluation of progressively worsening chest pain and SOB that started after missing dialysis a few days prior to presentation. He was admitted to the internal medicine teaching service for management. The specific problems addressed during admission were as follows:  SOB and pleuritic chest pain, consistent with volume overload : Patient's presenting symptoms have continued to  improve with dialysis yesterday. No overt signs of volume overload on exam, but imaging shows asymmetric insterstitial edema which is likely responsible for patient's subjective shortness of breath. Since patient's continuing to improve without intervention and has remained afebrile, suspect symptoms will continue to improve with regular dialysis. NO need for immediate dialysis today. Return precautions given.  -Discontinue antibiotics started in ED, afebrile overnight -Blood cultures no growth at 24 hours -Influenza A/B testing negative -Troponin levels <0.03 x3 overnight  ESRD on HD MWF: Patient stable for discharge with regular outpatient dialysis follow up.  HTN: Patient's BP slightly elevated today on home regimen. Suspect volume overload contributing.  -Amlodipine 5 mg, metoprolol XL 50 mg   FEN/GI: -Renal diet with 1.2L volume restriction -No IVF, replace electrolytes as needed  VTE Prophylaxis: Heparin TID Code Status: Full  Dispo: Anticipated discharge today.  Thomasene Ripple, MD 08/08/2017, 10:15 AM Pager: [phone]

## 2017-08-09 DIAGNOSIS — D509 Iron deficiency anemia, unspecified: Secondary | ICD-10-CM | POA: Diagnosis not present

## 2017-08-09 DIAGNOSIS — D631 Anemia in chronic kidney disease: Secondary | ICD-10-CM | POA: Diagnosis not present

## 2017-08-09 DIAGNOSIS — N2581 Secondary hyperparathyroidism of renal origin: Secondary | ICD-10-CM | POA: Diagnosis not present

## 2017-08-09 DIAGNOSIS — N186 End stage renal disease: Secondary | ICD-10-CM | POA: Diagnosis not present

## 2017-08-10 DIAGNOSIS — N186 End stage renal disease: Secondary | ICD-10-CM | POA: Diagnosis not present

## 2017-08-10 DIAGNOSIS — Z992 Dependence on renal dialysis: Secondary | ICD-10-CM | POA: Diagnosis not present

## 2017-08-10 DIAGNOSIS — H40051 Ocular hypertension, right eye: Secondary | ICD-10-CM | POA: Diagnosis not present

## 2017-08-10 DIAGNOSIS — N2889 Other specified disorders of kidney and ureter: Secondary | ICD-10-CM | POA: Diagnosis not present

## 2017-08-11 DIAGNOSIS — N2889 Other specified disorders of kidney and ureter: Secondary | ICD-10-CM | POA: Diagnosis not present

## 2017-08-11 DIAGNOSIS — Z992 Dependence on renal dialysis: Secondary | ICD-10-CM | POA: Diagnosis not present

## 2017-08-11 DIAGNOSIS — D509 Iron deficiency anemia, unspecified: Secondary | ICD-10-CM | POA: Diagnosis not present

## 2017-08-11 DIAGNOSIS — D631 Anemia in chronic kidney disease: Secondary | ICD-10-CM | POA: Diagnosis not present

## 2017-08-11 DIAGNOSIS — N186 End stage renal disease: Secondary | ICD-10-CM | POA: Diagnosis not present

## 2017-08-11 DIAGNOSIS — N2581 Secondary hyperparathyroidism of renal origin: Secondary | ICD-10-CM | POA: Diagnosis not present

## 2017-08-12 LAB — CULTURE, BLOOD (ROUTINE X 2)
Culture: NO GROWTH
Culture: NO GROWTH
Special Requests: ADEQUATE
Special Requests: ADEQUATE

## 2017-08-14 DIAGNOSIS — D631 Anemia in chronic kidney disease: Secondary | ICD-10-CM | POA: Diagnosis not present

## 2017-08-14 DIAGNOSIS — D509 Iron deficiency anemia, unspecified: Secondary | ICD-10-CM | POA: Diagnosis not present

## 2017-08-14 DIAGNOSIS — N186 End stage renal disease: Secondary | ICD-10-CM | POA: Diagnosis not present

## 2017-08-14 DIAGNOSIS — N2581 Secondary hyperparathyroidism of renal origin: Secondary | ICD-10-CM | POA: Diagnosis not present

## 2017-08-16 DIAGNOSIS — D509 Iron deficiency anemia, unspecified: Secondary | ICD-10-CM | POA: Diagnosis not present

## 2017-08-16 DIAGNOSIS — D631 Anemia in chronic kidney disease: Secondary | ICD-10-CM | POA: Diagnosis not present

## 2017-08-16 DIAGNOSIS — N186 End stage renal disease: Secondary | ICD-10-CM | POA: Diagnosis not present

## 2017-08-16 DIAGNOSIS — N2581 Secondary hyperparathyroidism of renal origin: Secondary | ICD-10-CM | POA: Diagnosis not present

## 2017-08-18 DIAGNOSIS — D509 Iron deficiency anemia, unspecified: Secondary | ICD-10-CM | POA: Diagnosis not present

## 2017-08-18 DIAGNOSIS — D631 Anemia in chronic kidney disease: Secondary | ICD-10-CM | POA: Diagnosis not present

## 2017-08-18 DIAGNOSIS — N186 End stage renal disease: Secondary | ICD-10-CM | POA: Diagnosis not present

## 2017-08-18 DIAGNOSIS — N2581 Secondary hyperparathyroidism of renal origin: Secondary | ICD-10-CM | POA: Diagnosis not present

## 2017-08-21 DIAGNOSIS — N186 End stage renal disease: Secondary | ICD-10-CM | POA: Diagnosis not present

## 2017-08-21 DIAGNOSIS — D509 Iron deficiency anemia, unspecified: Secondary | ICD-10-CM | POA: Diagnosis not present

## 2017-08-21 DIAGNOSIS — N2581 Secondary hyperparathyroidism of renal origin: Secondary | ICD-10-CM | POA: Diagnosis not present

## 2017-08-21 DIAGNOSIS — D631 Anemia in chronic kidney disease: Secondary | ICD-10-CM | POA: Diagnosis not present

## 2017-08-23 DIAGNOSIS — N2581 Secondary hyperparathyroidism of renal origin: Secondary | ICD-10-CM | POA: Diagnosis not present

## 2017-08-23 DIAGNOSIS — N186 End stage renal disease: Secondary | ICD-10-CM | POA: Diagnosis not present

## 2017-08-23 DIAGNOSIS — D631 Anemia in chronic kidney disease: Secondary | ICD-10-CM | POA: Diagnosis not present

## 2017-08-23 DIAGNOSIS — D509 Iron deficiency anemia, unspecified: Secondary | ICD-10-CM | POA: Diagnosis not present

## 2017-08-25 DIAGNOSIS — N2581 Secondary hyperparathyroidism of renal origin: Secondary | ICD-10-CM | POA: Diagnosis not present

## 2017-08-25 DIAGNOSIS — N186 End stage renal disease: Secondary | ICD-10-CM | POA: Diagnosis not present

## 2017-08-25 DIAGNOSIS — D509 Iron deficiency anemia, unspecified: Secondary | ICD-10-CM | POA: Diagnosis not present

## 2017-08-25 DIAGNOSIS — D631 Anemia in chronic kidney disease: Secondary | ICD-10-CM | POA: Diagnosis not present

## 2017-08-28 DIAGNOSIS — N186 End stage renal disease: Secondary | ICD-10-CM | POA: Diagnosis not present

## 2017-08-28 DIAGNOSIS — D631 Anemia in chronic kidney disease: Secondary | ICD-10-CM | POA: Diagnosis not present

## 2017-08-28 DIAGNOSIS — N2581 Secondary hyperparathyroidism of renal origin: Secondary | ICD-10-CM | POA: Diagnosis not present

## 2017-08-28 DIAGNOSIS — D509 Iron deficiency anemia, unspecified: Secondary | ICD-10-CM | POA: Diagnosis not present

## 2017-08-30 DIAGNOSIS — D631 Anemia in chronic kidney disease: Secondary | ICD-10-CM | POA: Diagnosis not present

## 2017-08-30 DIAGNOSIS — N2581 Secondary hyperparathyroidism of renal origin: Secondary | ICD-10-CM | POA: Diagnosis not present

## 2017-08-30 DIAGNOSIS — D509 Iron deficiency anemia, unspecified: Secondary | ICD-10-CM | POA: Diagnosis not present

## 2017-08-30 DIAGNOSIS — N186 End stage renal disease: Secondary | ICD-10-CM | POA: Diagnosis not present

## 2017-08-31 DIAGNOSIS — H25012 Cortical age-related cataract, left eye: Secondary | ICD-10-CM | POA: Diagnosis not present

## 2017-08-31 DIAGNOSIS — H2701 Aphakia, right eye: Secondary | ICD-10-CM | POA: Diagnosis not present

## 2017-08-31 DIAGNOSIS — H40012 Open angle with borderline findings, low risk, left eye: Secondary | ICD-10-CM | POA: Diagnosis not present

## 2017-08-31 DIAGNOSIS — H27112 Subluxation of lens, left eye: Secondary | ICD-10-CM | POA: Diagnosis not present

## 2017-09-01 DIAGNOSIS — N2581 Secondary hyperparathyroidism of renal origin: Secondary | ICD-10-CM | POA: Diagnosis not present

## 2017-09-01 DIAGNOSIS — N186 End stage renal disease: Secondary | ICD-10-CM | POA: Diagnosis not present

## 2017-09-01 DIAGNOSIS — D631 Anemia in chronic kidney disease: Secondary | ICD-10-CM | POA: Diagnosis not present

## 2017-09-01 DIAGNOSIS — D509 Iron deficiency anemia, unspecified: Secondary | ICD-10-CM | POA: Diagnosis not present

## 2017-09-04 DIAGNOSIS — D509 Iron deficiency anemia, unspecified: Secondary | ICD-10-CM | POA: Diagnosis not present

## 2017-09-04 DIAGNOSIS — D631 Anemia in chronic kidney disease: Secondary | ICD-10-CM | POA: Diagnosis not present

## 2017-09-04 DIAGNOSIS — N2581 Secondary hyperparathyroidism of renal origin: Secondary | ICD-10-CM | POA: Diagnosis not present

## 2017-09-04 DIAGNOSIS — N186 End stage renal disease: Secondary | ICD-10-CM | POA: Diagnosis not present

## 2017-09-06 DIAGNOSIS — N2581 Secondary hyperparathyroidism of renal origin: Secondary | ICD-10-CM | POA: Diagnosis not present

## 2017-09-06 DIAGNOSIS — N186 End stage renal disease: Secondary | ICD-10-CM | POA: Diagnosis not present

## 2017-09-06 DIAGNOSIS — D631 Anemia in chronic kidney disease: Secondary | ICD-10-CM | POA: Diagnosis not present

## 2017-09-06 DIAGNOSIS — D509 Iron deficiency anemia, unspecified: Secondary | ICD-10-CM | POA: Diagnosis not present

## 2017-09-08 DIAGNOSIS — Z992 Dependence on renal dialysis: Secondary | ICD-10-CM | POA: Diagnosis not present

## 2017-09-08 DIAGNOSIS — D631 Anemia in chronic kidney disease: Secondary | ICD-10-CM | POA: Diagnosis not present

## 2017-09-08 DIAGNOSIS — D509 Iron deficiency anemia, unspecified: Secondary | ICD-10-CM | POA: Diagnosis not present

## 2017-09-08 DIAGNOSIS — N2889 Other specified disorders of kidney and ureter: Secondary | ICD-10-CM | POA: Diagnosis not present

## 2017-09-08 DIAGNOSIS — N2581 Secondary hyperparathyroidism of renal origin: Secondary | ICD-10-CM | POA: Diagnosis not present

## 2017-09-08 DIAGNOSIS — N186 End stage renal disease: Secondary | ICD-10-CM | POA: Diagnosis not present

## 2017-09-10 ENCOUNTER — Other Ambulatory Visit: Payer: Self-pay | Admitting: Family Medicine

## 2017-09-11 DIAGNOSIS — D631 Anemia in chronic kidney disease: Secondary | ICD-10-CM | POA: Diagnosis not present

## 2017-09-11 DIAGNOSIS — N186 End stage renal disease: Secondary | ICD-10-CM | POA: Diagnosis not present

## 2017-09-11 DIAGNOSIS — N2581 Secondary hyperparathyroidism of renal origin: Secondary | ICD-10-CM | POA: Diagnosis not present

## 2017-09-11 DIAGNOSIS — D509 Iron deficiency anemia, unspecified: Secondary | ICD-10-CM | POA: Diagnosis not present

## 2017-09-11 MED ORDER — GABAPENTIN 300 MG PO CAPS: ORAL_CAPSULE | ORAL | 1 refills | Status: DC

## 2017-09-11 NOTE — Addendum Note (Signed)
Addended by: Scot Jun on: 09/11/2017 07:43 AM   Modules accepted: Orders

## 2017-09-11 NOTE — Telephone Encounter (Signed)
Refilled Neurontin with 90 day supply

## 2017-09-12 ENCOUNTER — Ambulatory Visit (INDEPENDENT_AMBULATORY_CARE_PROVIDER_SITE_OTHER): Payer: Medicare Other | Admitting: Family Medicine

## 2017-09-12 ENCOUNTER — Encounter: Payer: Self-pay | Admitting: Family Medicine

## 2017-09-12 VITALS — BP 168/96 | HR 84 | Temp 98.0°F | Resp 14 | Ht 68.0 in | Wt 160.2 lb

## 2017-09-12 DIAGNOSIS — I10 Essential (primary) hypertension: Secondary | ICD-10-CM | POA: Diagnosis not present

## 2017-09-12 DIAGNOSIS — G4700 Insomnia, unspecified: Secondary | ICD-10-CM | POA: Diagnosis not present

## 2017-09-12 MED ORDER — GABAPENTIN 300 MG PO CAPS: ORAL_CAPSULE | ORAL | 3 refills | Status: DC

## 2017-09-12 MED ORDER — AMLODIPINE BESYLATE 5 MG PO TABS: 5.0000 mg | ORAL_TABLET | Freq: Every day | ORAL | 1 refills | Status: DC

## 2017-09-12 NOTE — Patient Instructions (Addendum)
Check BP occasionally and goal BP 140/90 or less.   Low sodium diet. Quit smoking.   Coping with Quitting Smoking Quitting smoking is a physical and mental challenge. You will face cravings, withdrawal symptoms, and temptation. Before quitting, work with your health care provider to make a plan that can help you cope. Preparation can help you quit and keep you from giving in. How can I cope with cravings? Cravings usually last for 5-10 minutes. If you get through it, the craving will pass. Consider taking the following actions to help you cope with cravings:  Keep your mouth busy: ? Chew sugar-free gum. ? Suck on hard candies or a straw. ? Brush your teeth.  Keep your hands and body busy: ? Immediately change to a different activity when you feel a craving. ? Squeeze or play with a ball. ? Do an activity or a hobby, like making bead jewelry, practicing needlepoint, or working with wood. ? Mix up your normal routine. ? Take a short exercise break. Go for a quick walk or run up and down stairs. ? Spend time in public places where smoking is not allowed.  Focus on doing something kind or helpful for someone else.  Call a friend or family member to talk during a craving.  Join a support group.  Call a quit line, such as 1-800-QUIT-NOW.  Talk with your health care provider about medicines that might help you cope with cravings and make quitting easier for you.  How can I deal with withdrawal symptoms? Your body may experience negative effects as it tries to get used to not having nicotine in the system. These effects are called withdrawal symptoms. They may include:  Feeling hungrier than normal.  Trouble concentrating.  Irritability.  Trouble sleeping.  Feeling depressed.  Restlessness and agitation.  Craving a cigarette.  To manage withdrawal symptoms:  Avoid places, people, and activities that trigger your cravings.  Remember why you want to quit.  Get plenty of  sleep.  Avoid coffee and other caffeinated drinks. These may worsen some of your symptoms.  How can I handle social situations? Social situations can be difficult when you are quitting smoking, especially in the first few weeks. To manage this, you can:  Avoid parties, bars, and other social situations where people might be smoking.  Avoid alcohol.  Leave right away if you have the urge to smoke.  Explain to your family and friends that you are quitting smoking. Ask for understanding and support.  Plan activities with friends or family where smoking is not an option.  What are some ways I can cope with stress? Wanting to smoke may cause stress, and stress can make you want to smoke. Find ways to manage your stress. Relaxation techniques can help. For example:  Breathe slowly and deeply, in through your nose and out through your mouth.  Listen to soothing, relaxing music.  Talk with a family member or friend about your stress.  Light a candle.  Soak in a bath or take a shower.  Think about a peaceful place.  What are some ways I can prevent weight gain? Be aware that many people gain weight after they quit smoking. However, not everyone does. To keep from gaining weight, have a plan in place before you quit and stick to the plan after you quit. Your plan should include:  Having healthy snacks. When you have a craving, it may help to: ? Eat plain popcorn, crunchy carrots, celery, or other cut  vegetables. ? Chew sugar-free gum.  Changing how you eat: ? Eat small portion sizes at meals. ? Eat 4-6 small meals throughout the day instead of 1-2 large meals a day. ? Be mindful when you eat. Do not watch television or do other things that might distract you as you eat.  Exercising regularly: ? Make time to exercise each day. If you do not have time for a long workout, do short bouts of exercise for 5-10 minutes several times a day. ? Do some form of strengthening exercise, like  weight lifting, and some form of aerobic exercise, like running or swimming.  Drinking plenty of water or other low-calorie or no-calorie drinks. Drink 6-8 glasses of water daily, or as much as instructed by your health care provider.  Summary  Quitting smoking is a physical and mental challenge. You will face cravings, withdrawal symptoms, and temptation to smoke again. Preparation can help you as you go through these challenges.  You can cope with cravings by keeping your mouth busy (such as by chewing gum), keeping your body and hands busy, and making calls to family, friends, or a helpline for people who want to quit smoking.  You can cope with withdrawal symptoms by avoiding places where people smoke, avoiding drinks with caffeine, and getting plenty of rest.  Ask your health care provider about the different ways to prevent weight gain, avoid stress, and handle social situations. This information is not intended to replace advice given to you by your health care provider. Make sure you discuss any questions you have with your health care provider. Document Released: 06/24/2016 Document Revised: 06/24/2016 Document Reviewed: 06/24/2016 Elsevier Interactive Patient Education  [employer].

## 2017-09-12 NOTE — Progress Notes (Signed)
Patient ID: LEBERT LOVERN, male    DOB: [birth year], 62 y.o.   MRN: [id number]  PCP: Scot Jun, FNP  Chief Complaint  Patient presents with  . Follow-up    blood pressure     Subjective:  HPI Adedamola E Reier is a 62 y.o. male presents for hypertension follow-up. Medical problems significant for ESRD, HD Dialysis, COPD, Hypertension, hx Hep C (treated) and polysubstance abuse. Vikas was recently diagnosed with severe bilateral cataracts and will have surgery today to remove cataracts. Yarden doesn't routinely monitor his blood pressure although reports that his readings have been a little high during dialysis which he attends  M-W-F.  Dejean  was recently hospitalized for hypertensive urgency he is currently prescribed amlodipine 5 mg along with metoprolol 25 mg daily.  Unfortunately he has continued to smoke approximately 6 cigarettes/day during last visit he had been without smoking for several days.  He reports that he is physically active as he walks everywhere that he goes.  He denies lower extremity edema, chest pain, shortness of breath, cough, headache, or dizziness. Social History   Socioeconomic History  . Marital status: Single    Spouse name: Not on file  . Number of children: Not on file  . Years of education: Not on file  . Highest education level: Not on file  Social Needs  . Financial resource strain: Not on file  . Food insecurity - worry: Not on file  . Food insecurity - inability: Not on file  . Transportation needs - medical: Not on file  . Transportation needs - non-medical: Not on file  Occupational History  . Not on file  Tobacco Use  . Smoking status: Current Every Day Smoker    Packs/day: 1.00    Years: 25.00    Pack years: 25.00    Types: Cigarettes    Last attempt to quit: 08/01/2011    Years since quitting: 6.1  . Smokeless tobacco: Never Used  Substance and Sexual Activity  . Alcohol use: Yes    Comment: occasionally  . Drug use: Yes     Types: Cocaine    Comment: clean x approx 2 months (07/22/17)  . Sexual activity: Not on file  Other Topics Concern  . Not on file  Social History Narrative  . Not on file    Family History  Problem Relation Age of Onset  . Diabetes Father   . Hypertension Father    Review of Systems Constitutional: Negative.   HENT: Negative.   Eyes: Positive for visual disturbance.       Thick film covering eyes"told years ago that I have cataracts in both eyes"  Respiratory: Negative.   Cardiovascular: Negative.   Gastrointestinal: Negative.   Musculoskeletal:       Left arm fistula-current dialysis access   Skin: Negative.   Neurological: Negative.   Psychiatric/Behavioral: Negative.   Patient Active Problem List   Diagnosis Date Noted  . Volume overload 08/07/2017  . Acute pulmonary edema (HCC)   . Dyspnea 06/20/2017  . Acute bronchitis   . COPD exacerbation ([place])   . End-stage renal disease on hemodialysis ([place])   . Hypoxia 12/05/2016  . Hyperkalemia 12/19/2015  . Hypoglycemia 12/19/2015  . Polysubstance abuse ([place]) 12/19/2015  . Homelessness 12/19/2015  . Anemia associated with stage 5 chronic renal failure ([place]) 12/19/2015  . Atypical chest pain 12/19/2013  . Pain in limb 12/04/2012  . End stage renal disease ([place]) 12/04/2012  . HEPATITIS C 09/07/2006  .  HYPERCHOLESTEROLEMIA 09/07/2006  . HYPERTENSION, BENIGN SYSTEMIC 09/07/2006    No Known Allergies  Prior to Admission medications   Medication Sig Start Date End Date Taking? Authorizing Provider  acetaminophen (TYLENOL) 325 MG tablet Take 2 tablets (650 mg total) by mouth every 6 (six) hours as needed. 07/22/17  Yes Varney Biles, MD  gabapentin (NEURONTIN) 300 MG capsule TAKE 1 CAPSULE BY MOUTH AT BEDTIME AS NEEDED FOR SLEEP 09/11/17  Yes Scot Jun, FNP  metoprolol succinate (TOPROL-XL) 25 MG 24 hr tablet Take 2 tablets (50 mg total) by mouth daily. 07/13/17  Yes Scot Jun, FNP  Multiple Vitamin  (MULTIVITAMIN WITH MINERALS) TABS tablet Take 1 tablet by mouth daily. 12/07/16  Yes Lorella Nimrod, MD  sevelamer (RENAGEL) 800 MG tablet Take 800 mg by mouth 3 (three) times daily with meals.   Yes [provider]  thiamine (VITAMIN B-1) 100 MG tablet Take 100 mg by mouth 3 (three) times a week.    Yes [provider]  albuterol (PROVENTIL HFA;VENTOLIN HFA) 108 (90 Base) MCG/ACT inhaler Inhale 2 puffs into the lungs every 6 (six) hours as needed for wheezing or shortness of breath. Patient not taking: Reported on 09/12/2017 12/06/16   Lorella Nimrod, MD  amLODipine (NORVASC) 5 MG tablet Take 1 tablet (5 mg total) by mouth daily. Patient not taking: Reported on 09/12/2017 07/13/17   Scot Jun, FNP  cyclopentolate (CYCLODRYL,CYCLOGYL) 1 % ophthalmic solution Place 1 drop into the right eye 2 (two) times daily. Patient not taking: Reported on 09/12/2017 07/22/17   Varney Biles, MD  ofloxacin (OCUFLOX) 0.3 % ophthalmic solution Place 1 drop into the right eye 4 (four) times daily. 08/02/17   [provider]  prednisoLONE acetate (PRED FORTE) 1 % ophthalmic suspension Place 1 drop into the right eye 4 (four) times daily. Patient not taking: Reported on 09/12/2017 07/22/17   Varney Biles, MD  timolol (TIMOPTIC) 0.5 % ophthalmic solution Place 1 drop into the right eye 2 (two) times daily. 08/02/17   [provider]    Past Medical, Surgical Family and Social History reviewed and updated.    Objective:   Today's Vitals   09/12/17 1000  BP: (!) 168/96  Pulse: 84  Resp: 14  Temp: 98 F (36.7 C)  TempSrc: Oral  SpO2: 100%  Weight: 160 lb 3.2 oz (72.7 kg)  Height: 5\' 8"  (1.727 m)    Wt Readings from Last 3 Encounters:  09/12/17 160 lb 3.2 oz (72.7 kg)  08/08/17 161 lb 9.6 oz (73.3 kg)  07/22/17 160 lb (72.6 kg)    Physical Exam Constitutional: He is oriented to person, place, and time. He appears well-developed and well-nourished.  Eyes: Conjunctivae  and EOM are normal. Pupils are equal, round, and reactive to light.  Neck: Normal range of motion. Neck supple. No thyromegaly present.  Cardiovascular: Normal rate, regular rhythm, normal heart sounds and intact distal pulses.  Pulmonary/Chest: Effort normal and breath sounds normal.  Abdominal: He exhibits no distension and no mass. There is no tenderness. There is no rebound and no guarding.  Musculoskeletal: Normal range of motion. He exhibits no edema.  Neurological: He is alert and oriented to person, place, and time. Coordination normal.  Skin: Skin is warm and dry.  Psychiatric: He has a normal mood and affect. His behavior is normal. Thought content normal.  Assessment & Plan:  1. Essential hypertension, initially accelerated with on-arrival. Recheck improved (corrected reading was not placed in system). Patient is  out of amlodipine. No changed in doses today. Continue amlodipine and metoprolol. We have discussed target BP range and blood pressure goal. I have advised patient to check BP regularly and to call us back or report to clinic if the numbers are consistently higher than 140/90. We discussed the importance of compliance with medical therapy and DASH diet recommended, consequences of uncontrolled hypertension discussed.   2. Insomnia, continue Gabapentin 300 mg at bedtime as needed.   Meds ordered this encounter  Medications  . amLODipine (NORVASC) 5 MG tablet    Sig: Take 1 tablet (5 mg total) by mouth daily.    Dispense:  90 tablet    Refill:  1    Order Specific Question:   Supervising Provider    Answer:   Tresa Garter [id number]  . gabapentin (NEURONTIN) 300 MG capsule    Sig: TAKE 1 CAPSULE BY MOUTH AT BEDTIME AS NEEDED FOR SLEEP    Dispense:  90 capsule    Refill:  3    Order Specific Question:   Supervising Provider    Answer:   Tresa Garter [[id number]]    RTC:  3 months for hypertension and fasting lipid panel.   Carroll Sage. Kenton Kingfisher, MSN,  FNP-C [facility]  [address] [place], [place] [address] [phone]

## 2017-09-13 DIAGNOSIS — N186 End stage renal disease: Secondary | ICD-10-CM | POA: Diagnosis not present

## 2017-09-13 DIAGNOSIS — N2581 Secondary hyperparathyroidism of renal origin: Secondary | ICD-10-CM | POA: Diagnosis not present

## 2017-09-13 DIAGNOSIS — D509 Iron deficiency anemia, unspecified: Secondary | ICD-10-CM | POA: Diagnosis not present

## 2017-09-13 DIAGNOSIS — D631 Anemia in chronic kidney disease: Secondary | ICD-10-CM | POA: Diagnosis not present

## 2017-09-18 DIAGNOSIS — N2581 Secondary hyperparathyroidism of renal origin: Secondary | ICD-10-CM | POA: Diagnosis not present

## 2017-09-18 DIAGNOSIS — D509 Iron deficiency anemia, unspecified: Secondary | ICD-10-CM | POA: Diagnosis not present

## 2017-09-18 DIAGNOSIS — D631 Anemia in chronic kidney disease: Secondary | ICD-10-CM | POA: Diagnosis not present

## 2017-09-18 DIAGNOSIS — N186 End stage renal disease: Secondary | ICD-10-CM | POA: Diagnosis not present

## 2017-09-19 DIAGNOSIS — H2701 Aphakia, right eye: Secondary | ICD-10-CM | POA: Diagnosis not present

## 2017-09-20 DIAGNOSIS — N186 End stage renal disease: Secondary | ICD-10-CM | POA: Diagnosis not present

## 2017-09-20 DIAGNOSIS — D509 Iron deficiency anemia, unspecified: Secondary | ICD-10-CM | POA: Diagnosis not present

## 2017-09-20 DIAGNOSIS — N2581 Secondary hyperparathyroidism of renal origin: Secondary | ICD-10-CM | POA: Diagnosis not present

## 2017-09-20 DIAGNOSIS — D631 Anemia in chronic kidney disease: Secondary | ICD-10-CM | POA: Diagnosis not present

## 2017-09-22 DIAGNOSIS — D509 Iron deficiency anemia, unspecified: Secondary | ICD-10-CM | POA: Diagnosis not present

## 2017-09-22 DIAGNOSIS — D631 Anemia in chronic kidney disease: Secondary | ICD-10-CM | POA: Diagnosis not present

## 2017-09-22 DIAGNOSIS — N2581 Secondary hyperparathyroidism of renal origin: Secondary | ICD-10-CM | POA: Diagnosis not present

## 2017-09-22 DIAGNOSIS — N186 End stage renal disease: Secondary | ICD-10-CM | POA: Diagnosis not present

## 2017-09-25 DIAGNOSIS — N186 End stage renal disease: Secondary | ICD-10-CM | POA: Diagnosis not present

## 2017-09-25 DIAGNOSIS — D631 Anemia in chronic kidney disease: Secondary | ICD-10-CM | POA: Diagnosis not present

## 2017-09-25 DIAGNOSIS — D509 Iron deficiency anemia, unspecified: Secondary | ICD-10-CM | POA: Diagnosis not present

## 2017-09-25 DIAGNOSIS — N2581 Secondary hyperparathyroidism of renal origin: Secondary | ICD-10-CM | POA: Diagnosis not present

## 2017-09-26 DIAGNOSIS — H279 Unspecified disorder of lens: Secondary | ICD-10-CM | POA: Diagnosis not present

## 2017-09-26 DIAGNOSIS — H26102 Unspecified traumatic cataract, left eye: Secondary | ICD-10-CM | POA: Diagnosis not present

## 2017-09-26 DIAGNOSIS — Z961 Presence of intraocular lens: Secondary | ICD-10-CM | POA: Diagnosis not present

## 2017-09-26 DIAGNOSIS — H35373 Puckering of macula, bilateral: Secondary | ICD-10-CM | POA: Diagnosis not present

## 2017-09-27 DIAGNOSIS — D509 Iron deficiency anemia, unspecified: Secondary | ICD-10-CM | POA: Diagnosis not present

## 2017-09-27 DIAGNOSIS — D631 Anemia in chronic kidney disease: Secondary | ICD-10-CM | POA: Diagnosis not present

## 2017-09-27 DIAGNOSIS — N2581 Secondary hyperparathyroidism of renal origin: Secondary | ICD-10-CM | POA: Diagnosis not present

## 2017-09-27 DIAGNOSIS — N186 End stage renal disease: Secondary | ICD-10-CM | POA: Diagnosis not present

## 2017-09-29 DIAGNOSIS — N186 End stage renal disease: Secondary | ICD-10-CM | POA: Diagnosis not present

## 2017-09-29 DIAGNOSIS — N2581 Secondary hyperparathyroidism of renal origin: Secondary | ICD-10-CM | POA: Diagnosis not present

## 2017-09-29 DIAGNOSIS — D509 Iron deficiency anemia, unspecified: Secondary | ICD-10-CM | POA: Diagnosis not present

## 2017-09-29 DIAGNOSIS — D631 Anemia in chronic kidney disease: Secondary | ICD-10-CM | POA: Diagnosis not present

## 2017-10-02 DIAGNOSIS — N2581 Secondary hyperparathyroidism of renal origin: Secondary | ICD-10-CM | POA: Diagnosis not present

## 2017-10-02 DIAGNOSIS — D631 Anemia in chronic kidney disease: Secondary | ICD-10-CM | POA: Diagnosis not present

## 2017-10-02 DIAGNOSIS — D509 Iron deficiency anemia, unspecified: Secondary | ICD-10-CM | POA: Diagnosis not present

## 2017-10-02 DIAGNOSIS — N186 End stage renal disease: Secondary | ICD-10-CM | POA: Diagnosis not present

## 2017-10-04 DIAGNOSIS — N186 End stage renal disease: Secondary | ICD-10-CM | POA: Diagnosis not present

## 2017-10-04 DIAGNOSIS — D631 Anemia in chronic kidney disease: Secondary | ICD-10-CM | POA: Diagnosis not present

## 2017-10-04 DIAGNOSIS — D509 Iron deficiency anemia, unspecified: Secondary | ICD-10-CM | POA: Diagnosis not present

## 2017-10-04 DIAGNOSIS — N2581 Secondary hyperparathyroidism of renal origin: Secondary | ICD-10-CM | POA: Diagnosis not present

## 2017-10-09 DIAGNOSIS — N2581 Secondary hyperparathyroidism of renal origin: Secondary | ICD-10-CM | POA: Diagnosis not present

## 2017-10-09 DIAGNOSIS — Z992 Dependence on renal dialysis: Secondary | ICD-10-CM | POA: Diagnosis not present

## 2017-10-09 DIAGNOSIS — N2889 Other specified disorders of kidney and ureter: Secondary | ICD-10-CM | POA: Diagnosis not present

## 2017-10-09 DIAGNOSIS — D509 Iron deficiency anemia, unspecified: Secondary | ICD-10-CM | POA: Diagnosis not present

## 2017-10-09 DIAGNOSIS — N186 End stage renal disease: Secondary | ICD-10-CM | POA: Diagnosis not present

## 2017-10-13 DIAGNOSIS — N186 End stage renal disease: Secondary | ICD-10-CM | POA: Diagnosis not present

## 2017-10-13 DIAGNOSIS — D509 Iron deficiency anemia, unspecified: Secondary | ICD-10-CM | POA: Diagnosis not present

## 2017-10-13 DIAGNOSIS — N2581 Secondary hyperparathyroidism of renal origin: Secondary | ICD-10-CM | POA: Diagnosis not present

## 2017-10-16 DIAGNOSIS — N2581 Secondary hyperparathyroidism of renal origin: Secondary | ICD-10-CM | POA: Diagnosis not present

## 2017-10-16 DIAGNOSIS — D509 Iron deficiency anemia, unspecified: Secondary | ICD-10-CM | POA: Diagnosis not present

## 2017-10-16 DIAGNOSIS — N186 End stage renal disease: Secondary | ICD-10-CM | POA: Diagnosis not present

## 2017-10-20 DIAGNOSIS — N2581 Secondary hyperparathyroidism of renal origin: Secondary | ICD-10-CM | POA: Diagnosis not present

## 2017-10-20 DIAGNOSIS — D509 Iron deficiency anemia, unspecified: Secondary | ICD-10-CM | POA: Diagnosis not present

## 2017-10-20 DIAGNOSIS — N186 End stage renal disease: Secondary | ICD-10-CM | POA: Diagnosis not present

## 2017-10-23 DIAGNOSIS — N186 End stage renal disease: Secondary | ICD-10-CM | POA: Diagnosis not present

## 2017-10-23 DIAGNOSIS — N2581 Secondary hyperparathyroidism of renal origin: Secondary | ICD-10-CM | POA: Diagnosis not present

## 2017-10-23 DIAGNOSIS — D509 Iron deficiency anemia, unspecified: Secondary | ICD-10-CM | POA: Diagnosis not present

## 2017-10-27 DIAGNOSIS — D509 Iron deficiency anemia, unspecified: Secondary | ICD-10-CM | POA: Diagnosis not present

## 2017-10-27 DIAGNOSIS — N186 End stage renal disease: Secondary | ICD-10-CM | POA: Diagnosis not present

## 2017-10-27 DIAGNOSIS — N2581 Secondary hyperparathyroidism of renal origin: Secondary | ICD-10-CM | POA: Diagnosis not present

## 2017-10-30 DIAGNOSIS — D509 Iron deficiency anemia, unspecified: Secondary | ICD-10-CM | POA: Diagnosis not present

## 2017-10-30 DIAGNOSIS — N2581 Secondary hyperparathyroidism of renal origin: Secondary | ICD-10-CM | POA: Diagnosis not present

## 2017-10-30 DIAGNOSIS — N186 End stage renal disease: Secondary | ICD-10-CM | POA: Diagnosis not present

## 2017-10-31 DIAGNOSIS — H59031 Cystoid macular edema following cataract surgery, right eye: Secondary | ICD-10-CM | POA: Diagnosis not present

## 2017-11-01 DIAGNOSIS — D509 Iron deficiency anemia, unspecified: Secondary | ICD-10-CM | POA: Diagnosis not present

## 2017-11-01 DIAGNOSIS — N186 End stage renal disease: Secondary | ICD-10-CM | POA: Diagnosis not present

## 2017-11-01 DIAGNOSIS — N2581 Secondary hyperparathyroidism of renal origin: Secondary | ICD-10-CM | POA: Diagnosis not present

## 2017-11-02 DIAGNOSIS — Z992 Dependence on renal dialysis: Secondary | ICD-10-CM | POA: Diagnosis not present

## 2017-11-02 DIAGNOSIS — T82858A Stenosis of vascular prosthetic devices, implants and grafts, initial encounter: Secondary | ICD-10-CM | POA: Diagnosis not present

## 2017-11-02 DIAGNOSIS — N186 End stage renal disease: Secondary | ICD-10-CM | POA: Diagnosis not present

## 2017-11-02 DIAGNOSIS — I871 Compression of vein: Secondary | ICD-10-CM | POA: Diagnosis not present

## 2017-11-03 DIAGNOSIS — D509 Iron deficiency anemia, unspecified: Secondary | ICD-10-CM | POA: Diagnosis not present

## 2017-11-03 DIAGNOSIS — N2581 Secondary hyperparathyroidism of renal origin: Secondary | ICD-10-CM | POA: Diagnosis not present

## 2017-11-03 DIAGNOSIS — T82898A Other specified complication of vascular prosthetic devices, implants and grafts, initial encounter: Secondary | ICD-10-CM | POA: Insufficient documentation

## 2017-11-03 DIAGNOSIS — N186 End stage renal disease: Secondary | ICD-10-CM | POA: Diagnosis not present

## 2017-11-06 DIAGNOSIS — D509 Iron deficiency anemia, unspecified: Secondary | ICD-10-CM | POA: Diagnosis not present

## 2017-11-06 DIAGNOSIS — N186 End stage renal disease: Secondary | ICD-10-CM | POA: Diagnosis not present

## 2017-11-06 DIAGNOSIS — N2581 Secondary hyperparathyroidism of renal origin: Secondary | ICD-10-CM | POA: Diagnosis not present

## 2017-11-08 DIAGNOSIS — N186 End stage renal disease: Secondary | ICD-10-CM | POA: Diagnosis not present

## 2017-11-08 DIAGNOSIS — N2581 Secondary hyperparathyroidism of renal origin: Secondary | ICD-10-CM | POA: Diagnosis not present

## 2017-11-08 DIAGNOSIS — N2889 Other specified disorders of kidney and ureter: Secondary | ICD-10-CM | POA: Diagnosis not present

## 2017-11-08 DIAGNOSIS — Z992 Dependence on renal dialysis: Secondary | ICD-10-CM | POA: Diagnosis not present

## 2017-11-08 DIAGNOSIS — D509 Iron deficiency anemia, unspecified: Secondary | ICD-10-CM | POA: Diagnosis not present

## 2017-11-10 DIAGNOSIS — D509 Iron deficiency anemia, unspecified: Secondary | ICD-10-CM | POA: Diagnosis not present

## 2017-11-10 DIAGNOSIS — N186 End stage renal disease: Secondary | ICD-10-CM | POA: Diagnosis not present

## 2017-11-10 DIAGNOSIS — N2581 Secondary hyperparathyroidism of renal origin: Secondary | ICD-10-CM | POA: Diagnosis not present

## 2017-11-13 DIAGNOSIS — D509 Iron deficiency anemia, unspecified: Secondary | ICD-10-CM | POA: Diagnosis not present

## 2017-11-13 DIAGNOSIS — N186 End stage renal disease: Secondary | ICD-10-CM | POA: Diagnosis not present

## 2017-11-13 DIAGNOSIS — N2581 Secondary hyperparathyroidism of renal origin: Secondary | ICD-10-CM | POA: Diagnosis not present

## 2017-11-15 DIAGNOSIS — D509 Iron deficiency anemia, unspecified: Secondary | ICD-10-CM | POA: Diagnosis not present

## 2017-11-15 DIAGNOSIS — N186 End stage renal disease: Secondary | ICD-10-CM | POA: Diagnosis not present

## 2017-11-15 DIAGNOSIS — N2581 Secondary hyperparathyroidism of renal origin: Secondary | ICD-10-CM | POA: Diagnosis not present

## 2017-11-17 DIAGNOSIS — D509 Iron deficiency anemia, unspecified: Secondary | ICD-10-CM | POA: Diagnosis not present

## 2017-11-17 DIAGNOSIS — N2581 Secondary hyperparathyroidism of renal origin: Secondary | ICD-10-CM | POA: Diagnosis not present

## 2017-11-17 DIAGNOSIS — N186 End stage renal disease: Secondary | ICD-10-CM | POA: Diagnosis not present

## 2017-11-20 DIAGNOSIS — N186 End stage renal disease: Secondary | ICD-10-CM | POA: Diagnosis not present

## 2017-11-20 DIAGNOSIS — D509 Iron deficiency anemia, unspecified: Secondary | ICD-10-CM | POA: Diagnosis not present

## 2017-11-20 DIAGNOSIS — N2581 Secondary hyperparathyroidism of renal origin: Secondary | ICD-10-CM | POA: Diagnosis not present

## 2017-11-22 DIAGNOSIS — D509 Iron deficiency anemia, unspecified: Secondary | ICD-10-CM | POA: Diagnosis not present

## 2017-11-22 DIAGNOSIS — N186 End stage renal disease: Secondary | ICD-10-CM | POA: Diagnosis not present

## 2017-11-22 DIAGNOSIS — N2581 Secondary hyperparathyroidism of renal origin: Secondary | ICD-10-CM | POA: Diagnosis not present

## 2017-11-24 DIAGNOSIS — N2581 Secondary hyperparathyroidism of renal origin: Secondary | ICD-10-CM | POA: Diagnosis not present

## 2017-11-24 DIAGNOSIS — N186 End stage renal disease: Secondary | ICD-10-CM | POA: Diagnosis not present

## 2017-11-24 DIAGNOSIS — D509 Iron deficiency anemia, unspecified: Secondary | ICD-10-CM | POA: Diagnosis not present

## 2017-11-27 DIAGNOSIS — D509 Iron deficiency anemia, unspecified: Secondary | ICD-10-CM | POA: Diagnosis not present

## 2017-11-27 DIAGNOSIS — N2581 Secondary hyperparathyroidism of renal origin: Secondary | ICD-10-CM | POA: Diagnosis not present

## 2017-11-27 DIAGNOSIS — N186 End stage renal disease: Secondary | ICD-10-CM | POA: Diagnosis not present

## 2017-11-29 DIAGNOSIS — N186 End stage renal disease: Secondary | ICD-10-CM | POA: Diagnosis not present

## 2017-11-29 DIAGNOSIS — D509 Iron deficiency anemia, unspecified: Secondary | ICD-10-CM | POA: Diagnosis not present

## 2017-11-29 DIAGNOSIS — N2581 Secondary hyperparathyroidism of renal origin: Secondary | ICD-10-CM | POA: Diagnosis not present

## 2017-12-01 DIAGNOSIS — N2581 Secondary hyperparathyroidism of renal origin: Secondary | ICD-10-CM | POA: Diagnosis not present

## 2017-12-01 DIAGNOSIS — D509 Iron deficiency anemia, unspecified: Secondary | ICD-10-CM | POA: Diagnosis not present

## 2017-12-01 DIAGNOSIS — N186 End stage renal disease: Secondary | ICD-10-CM | POA: Diagnosis not present

## 2017-12-04 DIAGNOSIS — N186 End stage renal disease: Secondary | ICD-10-CM | POA: Diagnosis not present

## 2017-12-04 DIAGNOSIS — N2581 Secondary hyperparathyroidism of renal origin: Secondary | ICD-10-CM | POA: Diagnosis not present

## 2017-12-04 DIAGNOSIS — D509 Iron deficiency anemia, unspecified: Secondary | ICD-10-CM | POA: Diagnosis not present

## 2017-12-06 DIAGNOSIS — N2581 Secondary hyperparathyroidism of renal origin: Secondary | ICD-10-CM | POA: Diagnosis not present

## 2017-12-06 DIAGNOSIS — D509 Iron deficiency anemia, unspecified: Secondary | ICD-10-CM | POA: Diagnosis not present

## 2017-12-06 DIAGNOSIS — N186 End stage renal disease: Secondary | ICD-10-CM | POA: Diagnosis not present

## 2017-12-08 DIAGNOSIS — D509 Iron deficiency anemia, unspecified: Secondary | ICD-10-CM | POA: Diagnosis not present

## 2017-12-08 DIAGNOSIS — N2581 Secondary hyperparathyroidism of renal origin: Secondary | ICD-10-CM | POA: Diagnosis not present

## 2017-12-08 DIAGNOSIS — N186 End stage renal disease: Secondary | ICD-10-CM | POA: Diagnosis not present

## 2017-12-09 DIAGNOSIS — N186 End stage renal disease: Secondary | ICD-10-CM | POA: Diagnosis not present

## 2017-12-09 DIAGNOSIS — Z992 Dependence on renal dialysis: Secondary | ICD-10-CM | POA: Diagnosis not present

## 2017-12-09 DIAGNOSIS — N2889 Other specified disorders of kidney and ureter: Secondary | ICD-10-CM | POA: Diagnosis not present

## 2017-12-12 ENCOUNTER — Ambulatory Visit: Payer: Self-pay | Admitting: Family Medicine

## 2017-12-13 ENCOUNTER — Other Ambulatory Visit: Payer: Self-pay

## 2017-12-13 DIAGNOSIS — N2581 Secondary hyperparathyroidism of renal origin: Secondary | ICD-10-CM | POA: Diagnosis not present

## 2017-12-13 DIAGNOSIS — D631 Anemia in chronic kidney disease: Secondary | ICD-10-CM | POA: Diagnosis not present

## 2017-12-13 DIAGNOSIS — M1000 Idiopathic gout, unspecified site: Secondary | ICD-10-CM | POA: Insufficient documentation

## 2017-12-13 DIAGNOSIS — D509 Iron deficiency anemia, unspecified: Secondary | ICD-10-CM | POA: Diagnosis not present

## 2017-12-13 DIAGNOSIS — N186 End stage renal disease: Secondary | ICD-10-CM | POA: Diagnosis not present

## 2017-12-13 DIAGNOSIS — I739 Peripheral vascular disease, unspecified: Secondary | ICD-10-CM

## 2017-12-15 DIAGNOSIS — N2581 Secondary hyperparathyroidism of renal origin: Secondary | ICD-10-CM | POA: Diagnosis not present

## 2017-12-15 DIAGNOSIS — D509 Iron deficiency anemia, unspecified: Secondary | ICD-10-CM | POA: Diagnosis not present

## 2017-12-15 DIAGNOSIS — N186 End stage renal disease: Secondary | ICD-10-CM | POA: Diagnosis not present

## 2017-12-15 DIAGNOSIS — D631 Anemia in chronic kidney disease: Secondary | ICD-10-CM | POA: Diagnosis not present

## 2017-12-15 DIAGNOSIS — M1000 Idiopathic gout, unspecified site: Secondary | ICD-10-CM | POA: Diagnosis not present

## 2017-12-18 DIAGNOSIS — D631 Anemia in chronic kidney disease: Secondary | ICD-10-CM | POA: Diagnosis not present

## 2017-12-18 DIAGNOSIS — N186 End stage renal disease: Secondary | ICD-10-CM | POA: Diagnosis not present

## 2017-12-18 DIAGNOSIS — N2581 Secondary hyperparathyroidism of renal origin: Secondary | ICD-10-CM | POA: Diagnosis not present

## 2017-12-18 DIAGNOSIS — D509 Iron deficiency anemia, unspecified: Secondary | ICD-10-CM | POA: Diagnosis not present

## 2017-12-20 DIAGNOSIS — N2581 Secondary hyperparathyroidism of renal origin: Secondary | ICD-10-CM | POA: Diagnosis not present

## 2017-12-20 DIAGNOSIS — D509 Iron deficiency anemia, unspecified: Secondary | ICD-10-CM | POA: Diagnosis not present

## 2017-12-20 DIAGNOSIS — D631 Anemia in chronic kidney disease: Secondary | ICD-10-CM | POA: Diagnosis not present

## 2017-12-20 DIAGNOSIS — N186 End stage renal disease: Secondary | ICD-10-CM | POA: Diagnosis not present

## 2017-12-22 DIAGNOSIS — D509 Iron deficiency anemia, unspecified: Secondary | ICD-10-CM | POA: Diagnosis not present

## 2017-12-22 DIAGNOSIS — N186 End stage renal disease: Secondary | ICD-10-CM | POA: Diagnosis not present

## 2017-12-22 DIAGNOSIS — N2581 Secondary hyperparathyroidism of renal origin: Secondary | ICD-10-CM | POA: Diagnosis not present

## 2017-12-22 DIAGNOSIS — D631 Anemia in chronic kidney disease: Secondary | ICD-10-CM | POA: Diagnosis not present

## 2017-12-25 DIAGNOSIS — D509 Iron deficiency anemia, unspecified: Secondary | ICD-10-CM | POA: Diagnosis not present

## 2017-12-25 DIAGNOSIS — N2581 Secondary hyperparathyroidism of renal origin: Secondary | ICD-10-CM | POA: Diagnosis not present

## 2017-12-25 DIAGNOSIS — D631 Anemia in chronic kidney disease: Secondary | ICD-10-CM | POA: Diagnosis not present

## 2017-12-25 DIAGNOSIS — N186 End stage renal disease: Secondary | ICD-10-CM | POA: Diagnosis not present

## 2017-12-27 DIAGNOSIS — D509 Iron deficiency anemia, unspecified: Secondary | ICD-10-CM | POA: Diagnosis not present

## 2017-12-27 DIAGNOSIS — N186 End stage renal disease: Secondary | ICD-10-CM | POA: Diagnosis not present

## 2017-12-27 DIAGNOSIS — N2581 Secondary hyperparathyroidism of renal origin: Secondary | ICD-10-CM | POA: Diagnosis not present

## 2017-12-27 DIAGNOSIS — D631 Anemia in chronic kidney disease: Secondary | ICD-10-CM | POA: Diagnosis not present

## 2017-12-29 DIAGNOSIS — D509 Iron deficiency anemia, unspecified: Secondary | ICD-10-CM | POA: Diagnosis not present

## 2017-12-29 DIAGNOSIS — D631 Anemia in chronic kidney disease: Secondary | ICD-10-CM | POA: Diagnosis not present

## 2017-12-29 DIAGNOSIS — N186 End stage renal disease: Secondary | ICD-10-CM | POA: Diagnosis not present

## 2017-12-29 DIAGNOSIS — N2581 Secondary hyperparathyroidism of renal origin: Secondary | ICD-10-CM | POA: Diagnosis not present

## 2018-01-01 DIAGNOSIS — D509 Iron deficiency anemia, unspecified: Secondary | ICD-10-CM | POA: Diagnosis not present

## 2018-01-01 DIAGNOSIS — D631 Anemia in chronic kidney disease: Secondary | ICD-10-CM | POA: Diagnosis not present

## 2018-01-01 DIAGNOSIS — N186 End stage renal disease: Secondary | ICD-10-CM | POA: Diagnosis not present

## 2018-01-01 DIAGNOSIS — N2581 Secondary hyperparathyroidism of renal origin: Secondary | ICD-10-CM | POA: Diagnosis not present

## 2018-01-03 DIAGNOSIS — D631 Anemia in chronic kidney disease: Secondary | ICD-10-CM | POA: Diagnosis not present

## 2018-01-03 DIAGNOSIS — N2581 Secondary hyperparathyroidism of renal origin: Secondary | ICD-10-CM | POA: Diagnosis not present

## 2018-01-03 DIAGNOSIS — D509 Iron deficiency anemia, unspecified: Secondary | ICD-10-CM | POA: Diagnosis not present

## 2018-01-03 DIAGNOSIS — N186 End stage renal disease: Secondary | ICD-10-CM | POA: Diagnosis not present

## 2018-01-05 DIAGNOSIS — N2581 Secondary hyperparathyroidism of renal origin: Secondary | ICD-10-CM | POA: Diagnosis not present

## 2018-01-05 DIAGNOSIS — N186 End stage renal disease: Secondary | ICD-10-CM | POA: Diagnosis not present

## 2018-01-05 DIAGNOSIS — D509 Iron deficiency anemia, unspecified: Secondary | ICD-10-CM | POA: Diagnosis not present

## 2018-01-05 DIAGNOSIS — D631 Anemia in chronic kidney disease: Secondary | ICD-10-CM | POA: Diagnosis not present

## 2018-01-08 DIAGNOSIS — D631 Anemia in chronic kidney disease: Secondary | ICD-10-CM | POA: Diagnosis not present

## 2018-01-08 DIAGNOSIS — N186 End stage renal disease: Secondary | ICD-10-CM | POA: Diagnosis not present

## 2018-01-08 DIAGNOSIS — Z992 Dependence on renal dialysis: Secondary | ICD-10-CM | POA: Diagnosis not present

## 2018-01-08 DIAGNOSIS — N2581 Secondary hyperparathyroidism of renal origin: Secondary | ICD-10-CM | POA: Diagnosis not present

## 2018-01-08 DIAGNOSIS — N2889 Other specified disorders of kidney and ureter: Secondary | ICD-10-CM | POA: Diagnosis not present

## 2018-01-08 DIAGNOSIS — D509 Iron deficiency anemia, unspecified: Secondary | ICD-10-CM | POA: Diagnosis not present

## 2018-01-12 DIAGNOSIS — D631 Anemia in chronic kidney disease: Secondary | ICD-10-CM | POA: Diagnosis not present

## 2018-01-12 DIAGNOSIS — N186 End stage renal disease: Secondary | ICD-10-CM | POA: Diagnosis not present

## 2018-01-12 DIAGNOSIS — N2581 Secondary hyperparathyroidism of renal origin: Secondary | ICD-10-CM | POA: Diagnosis not present

## 2018-01-12 DIAGNOSIS — D509 Iron deficiency anemia, unspecified: Secondary | ICD-10-CM | POA: Diagnosis not present

## 2018-01-15 DIAGNOSIS — D509 Iron deficiency anemia, unspecified: Secondary | ICD-10-CM | POA: Diagnosis not present

## 2018-01-15 DIAGNOSIS — D631 Anemia in chronic kidney disease: Secondary | ICD-10-CM | POA: Diagnosis not present

## 2018-01-15 DIAGNOSIS — N2581 Secondary hyperparathyroidism of renal origin: Secondary | ICD-10-CM | POA: Diagnosis not present

## 2018-01-15 DIAGNOSIS — N186 End stage renal disease: Secondary | ICD-10-CM | POA: Diagnosis not present

## 2018-01-17 DIAGNOSIS — D631 Anemia in chronic kidney disease: Secondary | ICD-10-CM | POA: Diagnosis not present

## 2018-01-17 DIAGNOSIS — N2581 Secondary hyperparathyroidism of renal origin: Secondary | ICD-10-CM | POA: Diagnosis not present

## 2018-01-17 DIAGNOSIS — D509 Iron deficiency anemia, unspecified: Secondary | ICD-10-CM | POA: Diagnosis not present

## 2018-01-17 DIAGNOSIS — N186 End stage renal disease: Secondary | ICD-10-CM | POA: Diagnosis not present

## 2018-01-19 DIAGNOSIS — D509 Iron deficiency anemia, unspecified: Secondary | ICD-10-CM | POA: Diagnosis not present

## 2018-01-19 DIAGNOSIS — D631 Anemia in chronic kidney disease: Secondary | ICD-10-CM | POA: Diagnosis not present

## 2018-01-19 DIAGNOSIS — N2581 Secondary hyperparathyroidism of renal origin: Secondary | ICD-10-CM | POA: Diagnosis not present

## 2018-01-19 DIAGNOSIS — N186 End stage renal disease: Secondary | ICD-10-CM | POA: Diagnosis not present

## 2018-01-22 DIAGNOSIS — N2581 Secondary hyperparathyroidism of renal origin: Secondary | ICD-10-CM | POA: Diagnosis not present

## 2018-01-22 DIAGNOSIS — N186 End stage renal disease: Secondary | ICD-10-CM | POA: Diagnosis not present

## 2018-01-22 DIAGNOSIS — D509 Iron deficiency anemia, unspecified: Secondary | ICD-10-CM | POA: Diagnosis not present

## 2018-01-22 DIAGNOSIS — D631 Anemia in chronic kidney disease: Secondary | ICD-10-CM | POA: Diagnosis not present

## 2018-01-26 DIAGNOSIS — N186 End stage renal disease: Secondary | ICD-10-CM | POA: Diagnosis not present

## 2018-01-26 DIAGNOSIS — N2581 Secondary hyperparathyroidism of renal origin: Secondary | ICD-10-CM | POA: Diagnosis not present

## 2018-01-26 DIAGNOSIS — D509 Iron deficiency anemia, unspecified: Secondary | ICD-10-CM | POA: Diagnosis not present

## 2018-01-26 DIAGNOSIS — D631 Anemia in chronic kidney disease: Secondary | ICD-10-CM | POA: Diagnosis not present

## 2018-01-29 DIAGNOSIS — D509 Iron deficiency anemia, unspecified: Secondary | ICD-10-CM | POA: Diagnosis not present

## 2018-01-29 DIAGNOSIS — N2581 Secondary hyperparathyroidism of renal origin: Secondary | ICD-10-CM | POA: Diagnosis not present

## 2018-01-29 DIAGNOSIS — D631 Anemia in chronic kidney disease: Secondary | ICD-10-CM | POA: Diagnosis not present

## 2018-01-29 DIAGNOSIS — N186 End stage renal disease: Secondary | ICD-10-CM | POA: Diagnosis not present

## 2018-01-30 ENCOUNTER — Other Ambulatory Visit: Payer: Self-pay | Admitting: Surgery

## 2018-01-30 ENCOUNTER — Encounter: Payer: Self-pay | Admitting: Vascular Surgery

## 2018-01-30 ENCOUNTER — Encounter: Payer: Self-pay | Admitting: Surgery

## 2018-01-30 ENCOUNTER — Ambulatory Visit (HOSPITAL_COMMUNITY)
Admission: RE | Admit: 2018-01-30 | Discharge: 2018-01-30 | Disposition: A | Discharge status: Home / Self Care | Payer: Medicare Other | Source: Ambulatory Visit | Attending: Vascular Surgery | Admitting: Vascular Surgery

## 2018-01-30 ENCOUNTER — Other Ambulatory Visit: Payer: Self-pay

## 2018-01-30 ENCOUNTER — Ambulatory Visit (INDEPENDENT_AMBULATORY_CARE_PROVIDER_SITE_OTHER): Payer: Medicare Other | Admitting: Vascular Surgery

## 2018-01-30 VITALS — BP 133/88 | HR 85 | Temp 97.6°F | Resp 20 | Ht 68.0 in | Wt 157.5 lb

## 2018-01-30 DIAGNOSIS — F17200 Nicotine dependence, unspecified, uncomplicated: Secondary | ICD-10-CM | POA: Diagnosis not present

## 2018-01-30 DIAGNOSIS — I739 Peripheral vascular disease, unspecified: Secondary | ICD-10-CM | POA: Insufficient documentation

## 2018-01-30 DIAGNOSIS — I10 Essential (primary) hypertension: Secondary | ICD-10-CM | POA: Diagnosis not present

## 2018-01-30 NOTE — H&P (View-Only) (Signed)
[facility]  Patient name: Raymond [name] MRN: [id number] DOB: Nov 12, 1955 Sex: male  REASON FOR CONSULT: Left leg limiting claudication  HPI: Raymond [name] is a 62 y.o. male, who is here today for evaluation of limiting claudication in his left leg.  He reports that he has to walk for transportation and is unable to do his routine activities.  He reports calf claudication with very limited walking and this is completely relieved by rest.  He has no history of arterial rest pain or nonhealing ulceration in his lower extremities.  He denies any claudication in his right leg.  He does report numbness in both feet that occurs with and without walking.  I explained that this is not related to arterial insufficiency.  He is on hemodialysis and has been so for greater than 5 years.  He currently dialyzes via a left arm AV fistula.  Past Medical History:  Diagnosis Date  . Anemia   . COPD (chronic obstructive pulmonary disease) ([place])   . Dyspnea    "with too much fluid"  . ESRD (end stage renal disease) on dialysis [facility])    "MWF; [facility]" (07/22/17)  . Hemodialysis patient ([place])   . Hepatitis C    Still positive s/p liver biopsy at [facility]  and interferon therapy for 6 months. Most recent lab work was on 10/24/12  . Hepatitis C    "took the tx; gone now" (12/05/2016)  Was treated  . History of blood transfusion ~ 2012/2013   "related to my kidneys; blood was low"  . Hypertension   . Substance abuse ([place])   . Thyroid disease     Family History  Problem Relation Age of Onset  . Diabetes Father   . Hypertension Father     SOCIAL HISTORY: Social History   Socioeconomic History  . Marital status: Single    Spouse name: Not on file  . Number of children: Not on file  . Years of education: Not on file  . Highest education level: Not on file  Occupational History  . Not on file  Social Needs  . Financial resource  strain: Not on file  . Food insecurity:    Worry: Not on file    Inability: Not on file  . Transportation needs:    Medical: Not on file    Non-medical: Not on file  Tobacco Use  . Smoking status: Current Every Day Smoker    Packs/day: 1.00    Years: 25.00    Pack years: 25.00    Types: Cigarettes    Last attempt to quit: 08/01/2011    Years since quitting: 6.5  . Smokeless tobacco: Never Used  Substance and Sexual Activity  . Alcohol use: Yes    Comment: occasionally  . Drug use: Yes    Types: Cocaine    Comment: clean x approx 2 months (07/22/17)  . Sexual activity: Not on file  Lifestyle  . Physical activity:    Days per week: Not on file    Minutes per session: Not on file  . Stress: Not on file  Relationships  . Social connections:    Talks on phone: Not on file    Gets together: Not on file    Attends religious service: Not on file    Active member of club or organization: Not on file    Attends meetings of clubs or organizations: Not on file    Relationship status: Not  on file  . Intimate partner violence:    Fear of current or ex partner: Not on file    Emotionally abused: Not on file    Physically abused: Not on file    Forced sexual activity: Not on file  Other Topics Concern  . Not on file  Social History Narrative  . Not on file    No Known Allergies  Current Outpatient Medications  Medication Sig Dispense Refill  . acetaminophen (TYLENOL) 325 MG tablet Take 2 tablets (650 mg total) by mouth every 6 (six) hours as needed. 30 tablet 0  . amLODipine (NORVASC) 5 MG tablet Take 1 tablet (5 mg total) by mouth daily. 90 tablet 1  . gabapentin (NEURONTIN) 300 MG capsule TAKE 1 CAPSULE BY MOUTH AT BEDTIME AS NEEDED FOR SLEEP 90 capsule 3  . metoprolol succinate (TOPROL-XL) 25 MG 24 hr tablet Take 2 tablets (50 mg total) by mouth daily. 120 tablet 1  . Multiple Vitamin (MULTIVITAMIN WITH MINERALS) TABS tablet Take 1 tablet by mouth daily. 30 tablet 2  .  ofloxacin (OCUFLOX) 0.3 % ophthalmic solution Place 1 drop into the right eye 4 (four) times daily.  0  . sevelamer (RENAGEL) 800 MG tablet Take 800 mg by mouth 3 (three) times daily with meals.    . thiamine (VITAMIN B-1) 100 MG tablet Take 100 mg by mouth 3 (three) times a week.     . timolol (TIMOPTIC) 0.5 % ophthalmic solution Place 1 drop into the right eye 2 (two) times daily.  0  . albuterol (PROVENTIL HFA;VENTOLIN HFA) 108 (90 Base) MCG/ACT inhaler Inhale 2 puffs into the lungs every 6 (six) hours as needed for wheezing or shortness of breath. (Patient not taking: Reported on 09/12/2017) 1 Inhaler 2  . cyclopentolate (CYCLODRYL,CYCLOGYL) 1 % ophthalmic solution Place 1 drop into the right eye 2 (two) times daily. (Patient not taking: Reported on 09/12/2017) 5 mL 0  . prednisoLONE acetate (PRED FORTE) 1 % ophthalmic suspension Place 1 drop into the right eye 4 (four) times daily. (Patient not taking: Reported on 09/12/2017) 5 mL 0   No current facility-administered medications for this visit.    Facility-Administered Medications Ordered in Other Visits  Medication Dose Route Frequency Provider Last Rate Last Dose  . midazolam (VERSED) 5 MG/5ML injection    Anesthesia Intra-op Sammie Bench, CRNA   1 mg at 05/17/17 1631    REVIEW OF SYSTEMS:  [X]  denotes positive finding, [ ]  denotes negative finding Cardiac  Comments:  Chest pain or chest pressure:    Shortness of breath upon exertion: x   Short of breath when lying flat:    Irregular heart rhythm:        Vascular    Pain in calf, thigh, or hip brought on by ambulation: x   Pain in feet at night that wakes you up from your sleep:     Blood clot in your veins:    Leg swelling:  x       Pulmonary    Oxygen at home:    Productive cough:     Wheezing:         Neurologic    Sudden weakness in arms or legs:     Sudden numbness in arms or legs:     Sudden onset of difficulty speaking or slurred speech:    Temporary loss of  vision in one eye:  x   Problems with dizziness:  Gastrointestinal    Blood in stool:     Vomited blood:         Genitourinary    Burning when urinating:  x   Blood in urine:        Psychiatric    Major depression:         Hematologic    Bleeding problems:    Problems with blood clotting too easily:        Skin    Rashes or ulcers:        Constitutional    Fever or chills:      PHYSICAL EXAM: Vitals:   01/30/18 1005  BP: 133/88  Pulse: 85  Resp: 20  Temp: 97.6 F (36.4 C)  TempSrc: Oral  SpO2: 92%  Weight: 157 lb 8 oz (71.4 kg)  Height: 5\' 8"  (1.727 m)    GENERAL: The patient is a well-nourished male, in no acute distress. The vital signs are documented above. CARDIOVASCULAR: Palpable right radial pulse.  Fistula in his forearm on the left.  He does have 2+ femoral pulses bilaterally.  I do not palpate pedal pulses bilaterally. PULMONARY: There is good air exchange  ABDOMEN: Soft and non-tender  MUSCULOSKELETAL: There are no major deformities or cyanosis. NEUROLOGIC: No focal weakness or paresthesias are detected. SKIN: There are no ulcers or rashes noted. PSYCHIATRIC: The patient has a normal affect.  DATA:  Noninvasive studies in our office today revealed ankle arm index of 0.93 on the right and 0.55 on the left.  MEDICAL ISSUES: I discussed the significance of his superficial femoral occlusive disease with the patient.  I explained that this is not currently limb threatening but could progress to this.  He reports that he is unable to tolerate this level of claudication.  He reports significant pain with walking and has no other mode of transportation.  He will undergo outpatient arteriography for further evaluation.  Also explained that he may be a candidate for vascular treatment or may require open femoral to popliteal bypass depending on the anatomy.  He had seen Dr. Trula Slade years ago for fistulogram and will be scheduled on a nondialysis day for a  Tuesday with Dr. Gilmore Laroche, MD [facility] [phone] Pager [phone]

## 2018-01-30 NOTE — Progress Notes (Signed)
Vascular and Vein Specialist of [facility]  Patient name: Raymond [name] MRN: [id number] DOB: [birth year] Sex: male  REASON FOR CONSULT: Left leg limiting claudication  HPI: Raymond [name] is a 62 y.o. male, who is here today for evaluation of limiting claudication in his left leg.  He reports that he has to walk for transportation and is unable to do his routine activities.  He reports calf claudication with very limited walking and this is completely relieved by rest.  He has no history of arterial rest pain or nonhealing ulceration in his lower extremities.  He denies any claudication in his right leg.  He does report numbness in both feet that occurs with and without walking.  I explained that this is not related to arterial insufficiency.  He is on hemodialysis and has been so for greater than 5 years.  He currently dialyzes via a left arm AV fistula.  Past Medical History:  Diagnosis Date  . Anemia   . COPD (chronic obstructive pulmonary disease) ([place])   . Dyspnea    "with too much fluid"  . ESRD (end stage renal disease) on dialysis [facility])    "MWF; [facility]" (07/22/17)  . Hemodialysis patient ([place])   . Hepatitis C    Still positive s/p liver biopsy at [facility]  and interferon therapy for 6 months. Most recent lab work was on 10/24/12  . Hepatitis C    "took the tx; gone now" (12/05/2016)  Was treated  . History of blood transfusion ~ 2012/2013   "related to my kidneys; blood was low"  . Hypertension   . Substance abuse ([place])   . Thyroid disease     Family History  Problem Relation Age of Onset  . Diabetes Father   . Hypertension Father     SOCIAL HISTORY: Social History   Socioeconomic History  . Marital status: Single    Spouse name: Not on file  . Number of children: Not on file  . Years of education: Not on file  . Highest education level: Not on file  Occupational History  . Not on file  Social Needs  . Financial resource  strain: Not on file  . Food insecurity:    Worry: Not on file    Inability: Not on file  . Transportation needs:    Medical: Not on file    Non-medical: Not on file  Tobacco Use  . Smoking status: Current Every Day Smoker    Packs/day: 1.00    Years: 25.00    Pack years: 25.00    Types: Cigarettes    Last attempt to quit: 08/01/2011    Years since quitting: 6.5  . Smokeless tobacco: Never Used  Substance and Sexual Activity  . Alcohol use: Yes    Comment: occasionally  . Drug use: Yes    Types: Cocaine    Comment: clean x approx 2 months (07/22/17)  . Sexual activity: Not on file  Lifestyle  . Physical activity:    Days per week: Not on file    Minutes per session: Not on file  . Stress: Not on file  Relationships  . Social connections:    Talks on phone: Not on file    Gets together: Not on file    Attends religious service: Not on file    Active member of club or organization: Not on file    Attends meetings of clubs or organizations: Not on file    Relationship status: Not  on file  . Intimate partner violence:    Fear of current or ex partner: Not on file    Emotionally abused: Not on file    Physically abused: Not on file    Forced sexual activity: Not on file  Other Topics Concern  . Not on file  Social History Narrative  . Not on file    No Known Allergies  Current Outpatient Medications  Medication Sig Dispense Refill  . acetaminophen (TYLENOL) 325 MG tablet Take 2 tablets (650 mg total) by mouth every 6 (six) hours as needed. 30 tablet 0  . amLODipine (NORVASC) 5 MG tablet Take 1 tablet (5 mg total) by mouth daily. 90 tablet 1  . gabapentin (NEURONTIN) 300 MG capsule TAKE 1 CAPSULE BY MOUTH AT BEDTIME AS NEEDED FOR SLEEP 90 capsule 3  . metoprolol succinate (TOPROL-XL) 25 MG 24 hr tablet Take 2 tablets (50 mg total) by mouth daily. 120 tablet 1  . Multiple Vitamin (MULTIVITAMIN WITH MINERALS) TABS tablet Take 1 tablet by mouth daily. 30 tablet 2  .  ofloxacin (OCUFLOX) 0.3 % ophthalmic solution Place 1 drop into the right eye 4 (four) times daily.  0  . sevelamer (RENAGEL) 800 MG tablet Take 800 mg by mouth 3 (three) times daily with meals.    . thiamine (VITAMIN B-1) 100 MG tablet Take 100 mg by mouth 3 (three) times a week.     . timolol (TIMOPTIC) 0.5 % ophthalmic solution Place 1 drop into the right eye 2 (two) times daily.  0  . albuterol (PROVENTIL HFA;VENTOLIN HFA) 108 (90 Base) MCG/ACT inhaler Inhale 2 puffs into the lungs every 6 (six) hours as needed for wheezing or shortness of breath. (Patient not taking: Reported on 09/12/2017) 1 Inhaler 2  . cyclopentolate (CYCLODRYL,CYCLOGYL) 1 % ophthalmic solution Place 1 drop into the right eye 2 (two) times daily. (Patient not taking: Reported on 09/12/2017) 5 mL 0  . prednisoLONE acetate (PRED FORTE) 1 % ophthalmic suspension Place 1 drop into the right eye 4 (four) times daily. (Patient not taking: Reported on 09/12/2017) 5 mL 0   No current facility-administered medications for this visit.    Facility-Administered Medications Ordered in Other Visits  Medication Dose Route Frequency Provider Last Rate Last Dose  . midazolam (VERSED) 5 MG/5ML injection    Anesthesia Intra-op Sammie Bench, CRNA   1 mg at 05/17/17 1631    REVIEW OF SYSTEMS:  [X]  denotes positive finding, [ ]  denotes negative finding Cardiac  Comments:  Chest pain or chest pressure:    Shortness of breath upon exertion: x   Short of breath when lying flat:    Irregular heart rhythm:        Vascular    Pain in calf, thigh, or hip brought on by ambulation: x   Pain in feet at night that wakes you up from your sleep:     Blood clot in your veins:    Leg swelling:  x       Pulmonary    Oxygen at home:    Productive cough:     Wheezing:         Neurologic    Sudden weakness in arms or legs:     Sudden numbness in arms or legs:     Sudden onset of difficulty speaking or slurred speech:    Temporary loss of  vision in one eye:  x   Problems with dizziness:  Gastrointestinal    Blood in stool:     Vomited blood:         Genitourinary    Burning when urinating:  x   Blood in urine:        Psychiatric    Major depression:         Hematologic    Bleeding problems:    Problems with blood clotting too easily:        Skin    Rashes or ulcers:        Constitutional    Fever or chills:      PHYSICAL EXAM: Vitals:   01/30/18 1005  BP: 133/88  Pulse: 85  Resp: 20  Temp: 97.6 F (36.4 C)  TempSrc: Oral  SpO2: 92%  Weight: 157 lb 8 oz (71.4 kg)  Height: 5\' 8"  (1.727 m)    GENERAL: The patient is a well-nourished male, in no acute distress. The vital signs are documented above. CARDIOVASCULAR: Palpable right radial pulse.  Fistula in his forearm on the left.  He does have 2+ femoral pulses bilaterally.  I do not palpate pedal pulses bilaterally. PULMONARY: There is good air exchange  ABDOMEN: Soft and non-tender  MUSCULOSKELETAL: There are no major deformities or cyanosis. NEUROLOGIC: No focal weakness or paresthesias are detected. SKIN: There are no ulcers or rashes noted. PSYCHIATRIC: The patient has a normal affect.  DATA:  Noninvasive studies in our office today revealed ankle arm index of 0.93 on the right and 0.55 on the left.  MEDICAL ISSUES: I discussed the significance of his superficial femoral occlusive disease with the patient.  I explained that this is not currently limb threatening but could progress to this.  He reports that he is unable to tolerate this level of claudication.  He reports significant pain with walking and has no other mode of transportation.  He will undergo outpatient arteriography for further evaluation.  Also explained that he may be a candidate for vascular treatment or may require open femoral to popliteal bypass depending on the anatomy.  He had seen Dr. Trula Slade years ago for fistulogram and will be scheduled on a nondialysis day for a  Tuesday with Dr. Gilmore Laroche, MD [facility] Vascular and Vein Specialists of [facility] [phone] Pager [phone]

## 2018-01-31 DIAGNOSIS — D631 Anemia in chronic kidney disease: Secondary | ICD-10-CM | POA: Diagnosis not present

## 2018-01-31 DIAGNOSIS — D509 Iron deficiency anemia, unspecified: Secondary | ICD-10-CM | POA: Diagnosis not present

## 2018-01-31 DIAGNOSIS — N186 End stage renal disease: Secondary | ICD-10-CM | POA: Diagnosis not present

## 2018-01-31 DIAGNOSIS — N2581 Secondary hyperparathyroidism of renal origin: Secondary | ICD-10-CM | POA: Diagnosis not present

## 2018-02-02 DIAGNOSIS — N186 End stage renal disease: Secondary | ICD-10-CM | POA: Diagnosis not present

## 2018-02-02 DIAGNOSIS — D509 Iron deficiency anemia, unspecified: Secondary | ICD-10-CM | POA: Diagnosis not present

## 2018-02-02 DIAGNOSIS — D631 Anemia in chronic kidney disease: Secondary | ICD-10-CM | POA: Diagnosis not present

## 2018-02-02 DIAGNOSIS — N2581 Secondary hyperparathyroidism of renal origin: Secondary | ICD-10-CM | POA: Diagnosis not present

## 2018-02-05 DIAGNOSIS — D631 Anemia in chronic kidney disease: Secondary | ICD-10-CM | POA: Diagnosis not present

## 2018-02-05 DIAGNOSIS — N2581 Secondary hyperparathyroidism of renal origin: Secondary | ICD-10-CM | POA: Diagnosis not present

## 2018-02-05 DIAGNOSIS — D509 Iron deficiency anemia, unspecified: Secondary | ICD-10-CM | POA: Diagnosis not present

## 2018-02-05 DIAGNOSIS — N186 End stage renal disease: Secondary | ICD-10-CM | POA: Diagnosis not present

## 2018-02-06 ENCOUNTER — Other Ambulatory Visit: Payer: Self-pay | Admitting: *Deleted

## 2018-02-06 ENCOUNTER — Encounter: Payer: Self-pay | Admitting: *Deleted

## 2018-02-06 ENCOUNTER — Ambulatory Visit (HOSPITAL_COMMUNITY)
Admission: RE | Admit: 2018-02-06 | Discharge: 2018-02-06 | Disposition: A | Discharge status: Home / Self Care | Payer: Medicare Other | Source: Ambulatory Visit | Attending: Vascular Surgery | Admitting: Vascular Surgery

## 2018-02-06 ENCOUNTER — Encounter (HOSPITAL_COMMUNITY)
Admission: RE | Disposition: A | Discharge status: Home / Self Care | Payer: Self-pay | Source: Ambulatory Visit | Attending: Vascular Surgery

## 2018-02-06 DIAGNOSIS — I739 Peripheral vascular disease, unspecified: Secondary | ICD-10-CM | POA: Diagnosis not present

## 2018-02-06 DIAGNOSIS — Z539 Procedure and treatment not carried out, unspecified reason: Secondary | ICD-10-CM | POA: Insufficient documentation

## 2018-02-06 LAB — POCT I-STAT, CHEM 8
BUN: 32 mg/dL — ABNORMAL HIGH (ref 8–23)
Calcium, Ion: 0.93 mmol/L — ABNORMAL LOW (ref 1.15–1.40)
Chloride: 99 mmol/L (ref 98–111)
Creatinine, Ser: 8.4 mg/dL — ABNORMAL HIGH (ref 0.61–1.24)
Glucose, Bld: 85 mg/dL (ref 70–99)
HEMATOCRIT: 33 % — AB (ref 39.0–52.0)
Hemoglobin: 11.2 g/dL — ABNORMAL LOW (ref 13.0–17.0)
Potassium: 4.3 mmol/L (ref 3.5–5.1)
Sodium: 140 mmol/L (ref 135–145)
TCO2: 33 mmol/L — AB (ref 22–32)

## 2018-02-06 SURGERY — ABDOMINAL AORTOGRAM W/LOWER EXTREMITY
Anesthesia: LOCAL

## 2018-02-06 MED ORDER — SODIUM CHLORIDE 0.9% FLUSH: 3.0000 mL | Freq: Two times a day (BID) | INTRAVENOUS | Status: DC

## 2018-02-06 MED ORDER — SODIUM CHLORIDE 0.9% FLUSH: 3.0000 mL | INTRAVENOUS | Status: DC | PRN

## 2018-02-06 MED ORDER — SODIUM CHLORIDE 0.9 % IV SOLN: 250.0000 mL | INTRAVENOUS | Status: DC | PRN

## 2018-02-06 NOTE — Progress Notes (Signed)
Spoke with patient and Threasa Beards at [employer], Santiago Glad at [facility] lab. Patient will be rescheduled for aortogram on a Tuesday or Thursday.

## 2018-02-06 NOTE — Progress Notes (Signed)
Procedure rescheduled for 02/08/18 to be at [facility] admitting department at 7 am. Pre-procedure instructions per letter reviewed with patient and faxed to [facility] to give to patient.

## 2018-02-07 DIAGNOSIS — N2581 Secondary hyperparathyroidism of renal origin: Secondary | ICD-10-CM | POA: Diagnosis not present

## 2018-02-07 DIAGNOSIS — N186 End stage renal disease: Secondary | ICD-10-CM | POA: Diagnosis not present

## 2018-02-07 DIAGNOSIS — D631 Anemia in chronic kidney disease: Secondary | ICD-10-CM | POA: Diagnosis not present

## 2018-02-07 DIAGNOSIS — D509 Iron deficiency anemia, unspecified: Secondary | ICD-10-CM | POA: Diagnosis not present

## 2018-02-08 ENCOUNTER — Ambulatory Visit (HOSPITAL_COMMUNITY)
Admission: RE | Admit: 2018-02-08 | Discharge: 2018-02-08 | Disposition: A | Discharge status: Home / Self Care | Payer: Medicare Other | Source: Ambulatory Visit | Attending: Vascular Surgery | Admitting: Vascular Surgery

## 2018-02-08 ENCOUNTER — Encounter (HOSPITAL_COMMUNITY): Payer: Self-pay

## 2018-02-08 ENCOUNTER — Encounter (HOSPITAL_COMMUNITY)
Admission: RE | Disposition: A | Discharge status: Home / Self Care | Payer: Self-pay | Source: Ambulatory Visit | Attending: Vascular Surgery

## 2018-02-08 DIAGNOSIS — E079 Disorder of thyroid, unspecified: Secondary | ICD-10-CM | POA: Insufficient documentation

## 2018-02-08 DIAGNOSIS — N186 End stage renal disease: Secondary | ICD-10-CM | POA: Insufficient documentation

## 2018-02-08 DIAGNOSIS — Z992 Dependence on renal dialysis: Secondary | ICD-10-CM | POA: Diagnosis not present

## 2018-02-08 DIAGNOSIS — B1920 Unspecified viral hepatitis C without hepatic coma: Secondary | ICD-10-CM | POA: Insufficient documentation

## 2018-02-08 DIAGNOSIS — F17210 Nicotine dependence, cigarettes, uncomplicated: Secondary | ICD-10-CM | POA: Insufficient documentation

## 2018-02-08 DIAGNOSIS — I70212 Atherosclerosis of native arteries of extremities with intermittent claudication, left leg: Secondary | ICD-10-CM | POA: Insufficient documentation

## 2018-02-08 DIAGNOSIS — I739 Peripheral vascular disease, unspecified: Secondary | ICD-10-CM | POA: Diagnosis present

## 2018-02-08 DIAGNOSIS — D631 Anemia in chronic kidney disease: Secondary | ICD-10-CM | POA: Diagnosis not present

## 2018-02-08 DIAGNOSIS — I120 Hypertensive chronic kidney disease with stage 5 chronic kidney disease or end stage renal disease: Secondary | ICD-10-CM | POA: Diagnosis not present

## 2018-02-08 DIAGNOSIS — N2581 Secondary hyperparathyroidism of renal origin: Secondary | ICD-10-CM | POA: Diagnosis not present

## 2018-02-08 DIAGNOSIS — I70219 Atherosclerosis of native arteries of extremities with intermittent claudication, unspecified extremity: Secondary | ICD-10-CM

## 2018-02-08 DIAGNOSIS — J449 Chronic obstructive pulmonary disease, unspecified: Secondary | ICD-10-CM | POA: Insufficient documentation

## 2018-02-08 DIAGNOSIS — N2889 Other specified disorders of kidney and ureter: Secondary | ICD-10-CM | POA: Diagnosis not present

## 2018-02-08 DIAGNOSIS — D509 Iron deficiency anemia, unspecified: Secondary | ICD-10-CM | POA: Diagnosis not present

## 2018-02-08 HISTORY — PX: ABDOMINAL AORTOGRAM W/LOWER EXTREMITY: CATH118223

## 2018-02-08 LAB — POCT ACTIVATED CLOTTING TIME
ACTIVATED CLOTTING TIME: 191 s
ACTIVATED CLOTTING TIME: 169 s
Activated Clotting Time: 235 seconds
Activated Clotting Time: 180 seconds

## 2018-02-08 LAB — POCT I-STAT, CHEM 8
BUN: 23 mg/dL (ref 8–23)
CALCIUM ION: 0.99 mmol/L — AB (ref 1.15–1.40)
CREATININE: 7.6 mg/dL — AB (ref 0.61–1.24)
Chloride: 95 mmol/L — ABNORMAL LOW (ref 98–111)
GLUCOSE: 82 mg/dL (ref 70–99)
HEMATOCRIT: 39 % (ref 39.0–52.0)
HEMOGLOBIN: 13.3 g/dL (ref 13.0–17.0)
Potassium: 4.1 mmol/L (ref 3.5–5.1)
SODIUM: 140 mmol/L (ref 135–145)
TCO2: 33 mmol/L — AB (ref 22–32)

## 2018-02-08 SURGERY — ABDOMINAL AORTOGRAM W/LOWER EXTREMITY
Anesthesia: LOCAL

## 2018-02-08 MED ORDER — LABETALOL HCL 5 MG/ML IV SOLN: 10.0000 mg | INTRAVENOUS | Status: DC | PRN

## 2018-02-08 MED ORDER — HEPARIN SODIUM (PORCINE) 1000 UNIT/ML IJ SOLN
INTRAMUSCULAR | Status: DC | PRN
  Administered 2018-02-08: 800 [IU] via INTRAVENOUS

## 2018-02-08 MED ORDER — SODIUM CHLORIDE 0.9 % IV SOLN: 250.0000 mL | INTRAVENOUS | Status: DC | PRN

## 2018-02-08 MED ORDER — LIDOCAINE HCL (PF) 1 % IJ SOLN
INTRAMUSCULAR | Status: AC
  Filled 2018-02-08: qty 30

## 2018-02-08 MED ORDER — LIDOCAINE HCL (PF) 1 % IJ SOLN
INTRAMUSCULAR | Status: DC | PRN
  Administered 2018-02-08: 15 mL

## 2018-02-08 MED ORDER — ONDANSETRON HCL 4 MG/2ML IJ SOLN: 4.0000 mg | Freq: Four times a day (QID) | INTRAMUSCULAR | Status: DC | PRN

## 2018-02-08 MED ORDER — HEPARIN (PORCINE) IN NACL 1000-0.9 UT/500ML-% IV SOLN
INTRAVENOUS | Status: AC
  Filled 2018-02-08: qty 500

## 2018-02-08 MED ORDER — HEPARIN (PORCINE) IN NACL 1000-0.9 UT/500ML-% IV SOLN
INTRAVENOUS | Status: DC | PRN
  Administered 2018-02-08 (×2): 500 mL

## 2018-02-08 MED ORDER — SODIUM CHLORIDE 0.9% FLUSH: 3.0000 mL | INTRAVENOUS | Status: DC | PRN

## 2018-02-08 MED ORDER — HEPARIN SODIUM (PORCINE) 1000 UNIT/ML IJ SOLN
INTRAMUSCULAR | Status: AC
  Filled 2018-02-08: qty 1

## 2018-02-08 MED ORDER — IODIXANOL 320 MG/ML IV SOLN
INTRAVENOUS | Status: DC | PRN
  Administered 2018-02-08: 140 mL via INTRA_ARTERIAL

## 2018-02-08 MED ORDER — ACETAMINOPHEN 325 MG PO TABS: 650.0000 mg | ORAL_TABLET | ORAL | Status: DC | PRN

## 2018-02-08 MED ORDER — MIDAZOLAM HCL 2 MG/2ML IJ SOLN
INTRAMUSCULAR | Status: AC
  Filled 2018-02-08: qty 2

## 2018-02-08 MED ORDER — HYDRALAZINE HCL 20 MG/ML IJ SOLN: 5.0000 mg | INTRAMUSCULAR | Status: DC | PRN

## 2018-02-08 MED ORDER — SODIUM CHLORIDE 0.9% FLUSH: 3.0000 mL | Freq: Two times a day (BID) | INTRAVENOUS | Status: DC

## 2018-02-08 MED ORDER — FENTANYL CITRATE (PF) 100 MCG/2ML IJ SOLN
INTRAMUSCULAR | Status: DC | PRN
  Administered 2018-02-08: 50 ug via INTRAVENOUS

## 2018-02-08 MED ORDER — FENTANYL CITRATE (PF) 100 MCG/2ML IJ SOLN
INTRAMUSCULAR | Status: AC
  Filled 2018-02-08: qty 2

## 2018-02-08 MED ORDER — MIDAZOLAM HCL 2 MG/2ML IJ SOLN
INTRAMUSCULAR | Status: DC | PRN
  Administered 2018-02-08: 1 mg via INTRAVENOUS

## 2018-02-08 SURGICAL SUPPLY — 18 items
BAG SNAP BAND KOVER 36X36 (MISCELLANEOUS) ×1 IMPLANT
CATH ANGIO 5F BER2 65CM (CATHETERS) ×1 IMPLANT
CATH OMNI FLUSH 5F 65CM (CATHETERS) ×1 IMPLANT
CATH STRAIGHT 5FR 65CM (CATHETERS) ×1 IMPLANT
DEVICE CONTINUOUS FLUSH (MISCELLANEOUS) ×1 IMPLANT
GUIDEWIRE ANGLED .035X150CM (WIRE) ×1 IMPLANT
KIT ENCORE 26 ADVANTAGE (KITS) ×1 IMPLANT
KIT MICROPUNCTURE NIT STIFF (SHEATH) ×1 IMPLANT
KIT PV (KITS) ×2 IMPLANT
SHEATH BRITE TIP 7FR 35CM (SHEATH) ×1 IMPLANT
SHEATH PINNACLE 5F 10CM (SHEATH) ×1 IMPLANT
SHEATH PROBE COVER 6X72 (BAG) ×1 IMPLANT
SYR MEDRAD MARK V 150ML (SYRINGE) ×2 IMPLANT
TRANSDUCER W/STOPCOCK (MISCELLANEOUS) ×2 IMPLANT
TRAY PV CATH (CUSTOM PROCEDURE TRAY) ×2 IMPLANT
WIRE BENTSON .035X145CM (WIRE) ×1 IMPLANT
WIRE MICROPUNCTURE .018X50CM (WIRE) ×1 IMPLANT
WIRE ROSEN-J .035X260CM (WIRE) ×1 IMPLANT

## 2018-02-08 NOTE — Progress Notes (Signed)
Site area: rt groin fa sheath pulled and manual pressure held by Nelda Severe; last 10 minutes pressure held by [employer] Prior to Removal:  Level 0 Pressure Applied For: 25 minutes Manual:   yes Patient Status During Pull:  stable Post Pull Site:  Level 0 Post Pull Instructions Given:  yes Post Pull Pulses Present: right peroneal pulse and rt pt dopplered Dressing Applied:  Gauze and tegaderm Bedrest begins @ 1315 Comments:  IV saline locked

## 2018-02-08 NOTE — Discharge Instructions (Signed)
°  DRINK PLENTY OF FLUIDS FOR THE NEXT 2-3 DAYS TO KEEP HYDRATED.  Femoral Site Care Refer to this sheet in the next few weeks. These instructions provide you with information about caring for yourself after your procedure. Your health care provider may also give you more specific instructions. Your treatment has been planned according to current medical practices, but problems sometimes occur. Call your health care provider if you have any problems or questions after your procedure. What can I expect after the procedure? After your procedure, it is typical to have the following:  Bruising at the site that usually fades within 1-2 weeks.  Blood collecting in the tissue (hematoma) that may be painful to the touch. It should usually decrease in size and tenderness within 1-2 weeks.  Follow these instructions at home:  Take medicines only as directed by your health care provider.  You may shower 24-48 hours after the procedure or as directed by your health care provider. Remove the bandage (dressing) and gently wash the site with plain soap and water. Pat the area dry with a clean towel. Do not rub the site, because this may cause bleeding.  Do not take baths, swim, or use a hot tub until your health care provider approves.  Check your insertion site every day for redness, swelling, or drainage.  Do not apply powder or lotion to the site.  Limit use of stairs to twice a day for the first 2-3 days or as directed by your health care provider.  Do not squat for the first 2-3 days or as directed by your health care provider.  Do not lift over 10 lb (4.5 kg) for 5 days after your procedure or as directed by your health care provider.  Ask your health care provider when it is okay to: ? Return to work or school. ? Resume usual physical activities or sports. ? Resume sexual activity.  Do not drive home if you are discharged the same day as the procedure. Have someone else drive you.  You may  drive 24 hours after the procedure unless otherwise instructed by your health care provider.  Do not operate machinery or power tools for 24 hours after the procedure or as directed by your health care provider.  If your procedure was done as an outpatient procedure, which means that you went home the same day as your procedure, a responsible adult should be with you for the first 24 hours after you arrive home.  Keep all follow-up visits as directed by your health care provider. This is important. Contact a health care provider if:  You have a fever.  You have chills.  You have increased bleeding from the site. Hold pressure on the site. Get help right away if:  You have unusual pain at the site.  You have redness, warmth, or swelling at the site.  You have drainage (other than a small amount of blood on the dressing) from the site.  The site is bleeding, and the bleeding does not stop after 30 minutes of holding steady pressure on the site.  Your leg or foot becomes pale, cool, tingly, or numb. This information is not intended to replace advice given to you by your health care provider. Make sure you discuss any questions you have with your health care provider. Document Released: 02/28/2014 Document Revised: 12/03/2015 Document Reviewed: 01/14/2014 Elsevier Interactive Patient Education  [employer].

## 2018-02-08 NOTE — Progress Notes (Signed)
ACT still 191 at 1145.  Will redraw in 20-30 minutes to see if we can remove sheath from right femoral artery.  Site looks good level 0.

## 2018-02-08 NOTE — Interval H&P Note (Signed)
   History and Physical Update  The patient was interviewed and re-examined.  The patient's previous History and Physical has been reviewed and is unchanged from Dr. Luther Parody consult.  There is no change in the plan of care: aortogram, bilateral leg runoff, and possible left leg intervention.   I discussed with the patient the nature of angiographic procedures, especially the limited patencies of any endovascular intervention.    The patient is aware of that the risks of an angiographic procedure include but are not limited to: bleeding, infection, access site complications, renal failure, embolization, rupture of vessel, dissection, arteriovenous fistula, possible need for emergent surgical intervention, possible need for surgical procedures to treat the patient's pathology, anaphylactic reaction to contrast, and stroke and death.    The patient is aware of the risks and agrees to proceed.   Adele Barthel, MD, FACS [facility] Specialists of [place] Office: [phone] Pager: [phone]  02/08/2018, 8:57 AM

## 2018-02-08 NOTE — Op Note (Addendum)
OPERATIVE NOTE   PROCEDURE: 1.  Right common femoral artery cannulation under ultrasound guidance 2.  Placement of catheter in aorta 3.  Aortogram 4.  Second order arterial selection 5.  Left leg runoff via catheter 6.  Right leg runoff via sheath 7.  Conscious sedation for 64 minutes  PRE-OPERATIVE DIAGNOSIS: left leg intermittent claudication   POST-OPERATIVE DIAGNOSIS: same as above   SURGEON: Adele Barthel, MD  ANESTHESIA: conscious sedation  ESTIMATED BLOOD LOSS: 50 cc  CONTRAST: 140 cc  FINDING(S):  Aorta: patent, diffuse calcific disease  Superior mesenteric artery: not visualized Celiac artery: not visualized   Right Left  RA Patent, no nephrogram Patent with sub-total occlusion in proximal segment  CIA Patent, calcific disease throughout Patent, calcific disease throughout  EIA Patent, calcific disease throughout, 50-75% stenosis proximally (10-15 mm Hg gradient) Patent, calcific disease throughout  IIA Patent Patent  CFA Patent Patent  SFA Flush occlusion Patent proximally, long segment of calcific occlusion in mid-distal segment  PFA Patent, hypertrophied with collaterals reconstituting popliteal artery Patent, multiple collaterals  Pop Occluded proximally, reconstitutes at the knee, below-the-knee segment possible target Reconstitutes from collaterals, extensive calcific disease evident, 90% stenosis at the knee, below-the-knee segment possible target for bypass  Trif patent patent  AT Patent, co-dominant runoff Patent, co-dominant runoff  Pero Patent but miniscule throughout Patent, co-dominant runoff  PT Patent, co-dominant runoff Occluded   SPECIMEN(S):  none  INDICATIONS:   Raymond [name] is a 62 y.o. male who presents with left leg intermittent claudication.  The patient presents for: aortogram, bilateral leg runoff, and possible intervention.  I discussed with the patient the nature of angiographic procedures, especially the limited patencies of any  endovascular intervention.  The patient is aware of that the risks of an angiographic procedure include but are not limited to: bleeding, infection, access site complications, renal failure, embolization, rupture of vessel, dissection, possible need for emergent surgical intervention, possible need for surgical procedures to treat the patient's pathology, and stroke and death.  The patient is aware of the risks and agrees to proceed.  DESCRIPTION: After full informed consent was obtained from the patient, the patient was brought back to the angiography suite.  The patient was placed supine upon the angiography table and connected to cardiopulmonary monitoring equipment.  The patient was then given conscious sedation, the amounts of which are documented in the patient's chart.  A circulating radiologic technician maintained continuous monitoring of the patient's cardiopulmonary status.  Additionally, the control room radiologic technician provided backup monitoring throughout the procedure.  The patient was prepped and drape in the standard fashion for an angiographic procedure.  At this point, attention was turned to the right groin.  Under ultrasound guidance, the subcutaneous tissue surrounding the right common femoral artery was anesthesized with 1% lidocaine with epinephrine.  Under Sonosite guidance, the patency of the artery was noted to be: patent with calcific rim circumferentially.  This femoral bifurcation was more proximal.  Ultrasound image was permanently recorded.  The artery was then cannulated with a micropuncture needle.  The microwire was advanced into the iliac arterial system.  The needle was exchanged for a microsheath, which was loaded into the common femoral artery over the wire.  The microwire was exchanged for a Bentson wire which was advanced into the common iliac artery where it would not advance.  The microsheath was then exchanged for a 5-Fr sheath which was loaded into the common  femoral artery.  With some manipulation, the  Bentson wire was able to cross the common iliac artery.  The Omniflush catheter was then loaded over the wire up to the level of L1.  The catheter was connected to the power injector circuit.  After de-airring and de-clotting the circuit, a power injector aortogram was completed.      Using a Bentson wire and Omniflush catheter, the left common iliac artery was selected.  Neither the catheter nor wire would go into the external iliac artery.  I exchanged the catheter for a straight catheter.  The catheter was kicked out due to the tortuosity and heavy calcification.  I replaced the Bentson and Omniflush.  I selected the left common iliac artery with this combination.  The wire was exchanged for a Glidewire, which I could get into the common femoral artery.  The catheter was exchanged for a straight catheter.  This catheter would only barely get into the proximal external iliac artery.  The wire was removed and the catheter connected to the power injector circuit.  An automated left leg runoff was completed. The findings are listed above.  As the bifurcation cannot be crossed and I doubt the superficial femoral artery can be easily crossed with the long-segments of calcific disease, this patient will likely need a left femoropopliteal bypass.  At this point, I pulled the catheter into the aorta.  I did a pull back gradient across the right iliac bifurcation.  I measured a 10-15 mm Hg gradient.  The stenosis appeared to be 50-75%, however, so I felt an attempt at intervention was indicated.  I passed a Rosen wire up the sheath.  The right femoral sheath was exchanged for a 7-Fr long Britetip sheath which was placed in the external iliac artery.  The patient was given 8000 units of Heparin intravenously, which was a therapeutic bolus.   I tried to get back into the aorta using multiple combination of catheters and wires, but it became evident that there was a  dissection flap in the proximal external iliac artery.  Based on hand injection and intra-arterial pressure measurements, this was a non-flow limiting flap.  I pulled the sheath distal to the dissection and aspirate the right femoral sheath.  I connected the power injector to the sheath and completed an automated right leg runoff.  This essentially proved that the dissection was non-flow limiting.    The sheath was aspirated.  No clots were present and the sheath was reloaded with heparinized saline.  The sheath will be removed once this patient's anticoagulation reverses.  Would let the patient's right iliac arterial system heal over the next 2-4 weeks and try again to cross the stenosis at the right iliac bifurcation.  The left leg will likely need to managed with femoropopliteal bypass, as I doubt any lasting endovascular therapy is possible.   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

## 2018-02-09 ENCOUNTER — Telehealth: Payer: Self-pay | Admitting: Vascular Surgery

## 2018-02-09 ENCOUNTER — Encounter (HOSPITAL_COMMUNITY): Payer: Self-pay | Admitting: Vascular Surgery

## 2018-02-09 NOTE — Telephone Encounter (Signed)
Sched. Appt. LVM 03/14/18 12:30pm f/u PA cardiac clarence   03/20/18 2pm lab 3pm TFE f/u

## 2018-02-12 ENCOUNTER — Other Ambulatory Visit: Payer: Self-pay

## 2018-02-12 DIAGNOSIS — I70219 Atherosclerosis of native arteries of extremities with intermittent claudication, unspecified extremity: Secondary | ICD-10-CM

## 2018-02-12 DIAGNOSIS — D631 Anemia in chronic kidney disease: Secondary | ICD-10-CM | POA: Diagnosis not present

## 2018-02-12 DIAGNOSIS — D509 Iron deficiency anemia, unspecified: Secondary | ICD-10-CM | POA: Diagnosis not present

## 2018-02-12 DIAGNOSIS — N186 End stage renal disease: Secondary | ICD-10-CM | POA: Diagnosis not present

## 2018-02-12 DIAGNOSIS — N2581 Secondary hyperparathyroidism of renal origin: Secondary | ICD-10-CM | POA: Diagnosis not present

## 2018-02-12 DIAGNOSIS — I739 Peripheral vascular disease, unspecified: Secondary | ICD-10-CM

## 2018-02-14 DIAGNOSIS — D509 Iron deficiency anemia, unspecified: Secondary | ICD-10-CM | POA: Diagnosis not present

## 2018-02-14 DIAGNOSIS — D631 Anemia in chronic kidney disease: Secondary | ICD-10-CM | POA: Diagnosis not present

## 2018-02-14 DIAGNOSIS — N2581 Secondary hyperparathyroidism of renal origin: Secondary | ICD-10-CM | POA: Diagnosis not present

## 2018-02-14 DIAGNOSIS — N186 End stage renal disease: Secondary | ICD-10-CM | POA: Diagnosis not present

## 2018-02-16 DIAGNOSIS — N2581 Secondary hyperparathyroidism of renal origin: Secondary | ICD-10-CM | POA: Diagnosis not present

## 2018-02-16 DIAGNOSIS — D631 Anemia in chronic kidney disease: Secondary | ICD-10-CM | POA: Diagnosis not present

## 2018-02-16 DIAGNOSIS — D509 Iron deficiency anemia, unspecified: Secondary | ICD-10-CM | POA: Diagnosis not present

## 2018-02-16 DIAGNOSIS — N186 End stage renal disease: Secondary | ICD-10-CM | POA: Diagnosis not present

## 2018-02-19 DIAGNOSIS — D509 Iron deficiency anemia, unspecified: Secondary | ICD-10-CM | POA: Diagnosis not present

## 2018-02-19 DIAGNOSIS — N186 End stage renal disease: Secondary | ICD-10-CM | POA: Diagnosis not present

## 2018-02-19 DIAGNOSIS — N2581 Secondary hyperparathyroidism of renal origin: Secondary | ICD-10-CM | POA: Diagnosis not present

## 2018-02-19 DIAGNOSIS — D631 Anemia in chronic kidney disease: Secondary | ICD-10-CM | POA: Diagnosis not present

## 2018-02-21 DIAGNOSIS — N186 End stage renal disease: Secondary | ICD-10-CM | POA: Diagnosis not present

## 2018-02-21 DIAGNOSIS — D509 Iron deficiency anemia, unspecified: Secondary | ICD-10-CM | POA: Diagnosis not present

## 2018-02-21 DIAGNOSIS — N2581 Secondary hyperparathyroidism of renal origin: Secondary | ICD-10-CM | POA: Diagnosis not present

## 2018-02-21 DIAGNOSIS — D631 Anemia in chronic kidney disease: Secondary | ICD-10-CM | POA: Diagnosis not present

## 2018-02-23 DIAGNOSIS — D631 Anemia in chronic kidney disease: Secondary | ICD-10-CM | POA: Diagnosis not present

## 2018-02-23 DIAGNOSIS — N2581 Secondary hyperparathyroidism of renal origin: Secondary | ICD-10-CM | POA: Diagnosis not present

## 2018-02-23 DIAGNOSIS — D509 Iron deficiency anemia, unspecified: Secondary | ICD-10-CM | POA: Diagnosis not present

## 2018-02-23 DIAGNOSIS — N186 End stage renal disease: Secondary | ICD-10-CM | POA: Diagnosis not present

## 2018-02-26 DIAGNOSIS — D631 Anemia in chronic kidney disease: Secondary | ICD-10-CM | POA: Diagnosis not present

## 2018-02-26 DIAGNOSIS — N186 End stage renal disease: Secondary | ICD-10-CM | POA: Diagnosis not present

## 2018-02-26 DIAGNOSIS — D509 Iron deficiency anemia, unspecified: Secondary | ICD-10-CM | POA: Diagnosis not present

## 2018-02-26 DIAGNOSIS — N2581 Secondary hyperparathyroidism of renal origin: Secondary | ICD-10-CM | POA: Diagnosis not present

## 2018-02-28 DIAGNOSIS — D631 Anemia in chronic kidney disease: Secondary | ICD-10-CM | POA: Diagnosis not present

## 2018-02-28 DIAGNOSIS — N186 End stage renal disease: Secondary | ICD-10-CM | POA: Diagnosis not present

## 2018-02-28 DIAGNOSIS — N2581 Secondary hyperparathyroidism of renal origin: Secondary | ICD-10-CM | POA: Diagnosis not present

## 2018-02-28 DIAGNOSIS — D509 Iron deficiency anemia, unspecified: Secondary | ICD-10-CM | POA: Diagnosis not present

## 2018-03-05 DIAGNOSIS — D631 Anemia in chronic kidney disease: Secondary | ICD-10-CM | POA: Diagnosis not present

## 2018-03-05 DIAGNOSIS — N2581 Secondary hyperparathyroidism of renal origin: Secondary | ICD-10-CM | POA: Diagnosis not present

## 2018-03-05 DIAGNOSIS — D509 Iron deficiency anemia, unspecified: Secondary | ICD-10-CM | POA: Diagnosis not present

## 2018-03-05 DIAGNOSIS — N186 End stage renal disease: Secondary | ICD-10-CM | POA: Diagnosis not present

## 2018-03-07 DIAGNOSIS — N186 End stage renal disease: Secondary | ICD-10-CM | POA: Diagnosis not present

## 2018-03-07 DIAGNOSIS — D509 Iron deficiency anemia, unspecified: Secondary | ICD-10-CM | POA: Diagnosis not present

## 2018-03-07 DIAGNOSIS — D631 Anemia in chronic kidney disease: Secondary | ICD-10-CM | POA: Diagnosis not present

## 2018-03-07 DIAGNOSIS — N2581 Secondary hyperparathyroidism of renal origin: Secondary | ICD-10-CM | POA: Diagnosis not present

## 2018-03-09 DIAGNOSIS — D509 Iron deficiency anemia, unspecified: Secondary | ICD-10-CM | POA: Diagnosis not present

## 2018-03-09 DIAGNOSIS — D631 Anemia in chronic kidney disease: Secondary | ICD-10-CM | POA: Diagnosis not present

## 2018-03-09 DIAGNOSIS — N2581 Secondary hyperparathyroidism of renal origin: Secondary | ICD-10-CM | POA: Diagnosis not present

## 2018-03-09 DIAGNOSIS — N186 End stage renal disease: Secondary | ICD-10-CM | POA: Diagnosis not present

## 2018-03-11 DIAGNOSIS — Z992 Dependence on renal dialysis: Secondary | ICD-10-CM | POA: Diagnosis not present

## 2018-03-11 DIAGNOSIS — N186 End stage renal disease: Secondary | ICD-10-CM | POA: Diagnosis not present

## 2018-03-11 DIAGNOSIS — N2889 Other specified disorders of kidney and ureter: Secondary | ICD-10-CM | POA: Diagnosis not present

## 2018-03-12 DIAGNOSIS — N2581 Secondary hyperparathyroidism of renal origin: Secondary | ICD-10-CM | POA: Diagnosis not present

## 2018-03-12 DIAGNOSIS — N186 End stage renal disease: Secondary | ICD-10-CM | POA: Diagnosis not present

## 2018-03-12 DIAGNOSIS — D509 Iron deficiency anemia, unspecified: Secondary | ICD-10-CM | POA: Diagnosis not present

## 2018-03-12 DIAGNOSIS — D631 Anemia in chronic kidney disease: Secondary | ICD-10-CM | POA: Diagnosis not present

## 2018-03-14 ENCOUNTER — Ambulatory Visit: Payer: Medicare Other | Admitting: Physician Assistant

## 2018-03-14 DIAGNOSIS — D631 Anemia in chronic kidney disease: Secondary | ICD-10-CM | POA: Diagnosis not present

## 2018-03-14 DIAGNOSIS — D509 Iron deficiency anemia, unspecified: Secondary | ICD-10-CM | POA: Diagnosis not present

## 2018-03-14 DIAGNOSIS — N2581 Secondary hyperparathyroidism of renal origin: Secondary | ICD-10-CM | POA: Diagnosis not present

## 2018-03-14 DIAGNOSIS — N186 End stage renal disease: Secondary | ICD-10-CM | POA: Diagnosis not present

## 2018-03-14 NOTE — Progress Notes (Deleted)
Cardiology Office Note    Date:  03/14/2018   ID:  Raymond [name], DOB 12-Sep-1955, MRN [id number]  PCP:  Raymond Jun, FNP  Cardiologist: No primary care provider on file.  No chief complaint on file.   History of Present Illness:  Raymond [name] is a 62 y.o. male with history of hypertension, HLD, ESRD on HD, hepatitis C who saw Raymond [name] 12/2013 for atypical chest pain. GXT and echo ordered but never done.  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 early.  Past Medical History:  Diagnosis Date  . Anemia   . COPD (chronic obstructive pulmonary disease) ([place])   . Dyspnea    "with too much fluid"  . ESRD (end stage renal disease) on dialysis [facility])    "MWF; Jeneen Rinks" (07/22/17)  . Hemodialysis patient ([place])   . Hepatitis C    Still positive s/p liver biopsy at [facility]  and interferon therapy for 6 months. Most recent lab work was on 10/24/12  . Hepatitis C    "took the tx; gone now" (12/05/2016)  Was treated  . History of blood transfusion ~ 2012/2013   "related to my kidneys; blood was low"  . Hypertension   . Substance abuse ([place])   . Thyroid disease     Past Surgical History:  Procedure Laterality Date  . ABDOMINAL AORTOGRAM W/LOWER EXTREMITY N/A 02/08/2018   Procedure: ABDOMINAL AORTOGRAM W/LOWER EXTREMITY;  Surgeon: Raymond Rock Mills, MD;  Location: [place] CV LAB;  Service: Cardiovascular;  Laterality: N/A;  . AV FISTULA PLACEMENT Left Aug. 2013 ?  [name] LIVER BIOPSY    . ORIF ULNAR FRACTURE Left 05/18/2017   Procedure: OPEN REDUCTION INTERNAL FIXATION (ORIF) ULNAR FRACTURE;  Surgeon:  Raymond Planas, MD;  Location: [place];  Service: Orthopedics;  Laterality: Left;  . SHUNTOGRAM N/A 12/11/2012   Procedure: Raymond [name];  Surgeon: Raymond Mitchell, MD;  Location: [facility] CATH LAB;  Service: Cardiovascular;  Laterality: N/A;    Current Medications: No outpatient medications have been marked as taking for the 03/14/18 encounter (Appointment) with Raymond Burn, PA-C.     Allergies:   Patient has no known allergies.   Social History   Socioeconomic History  . Marital status: Single    Spouse name: Not on file  . Number of children: Not on file  . Years of education: Not on file  . Highest education level: Not on file  Occupational History  . Not on file  Social Needs  . Financial resource strain: Not on file  . Food insecurity:    Worry: Not on file    Inability: Not on file  . Transportation needs:    Medical: Not on file    Non-medical: Not on file  Tobacco Use  . Smoking status: Current Every Day Smoker    Packs/day: 1.00    Years: 25.00    Pack years: 25.00    Types: Cigarettes    Last attempt to quit: 08/01/2011    Years since quitting: 6.6  . Smokeless tobacco: Never Used  Substance and Sexual Activity  . Alcohol use: Yes  Comment: occasionally  . Drug use: Yes    Types: Cocaine    Comment: clean x approx 2 months (07/22/17)  . Sexual activity: Not on file  Lifestyle  . Physical activity:    Days per week: Not on file    Minutes per session: Not on file  . Stress: Not on file  Relationships  . Social connections:    Talks on phone: Not on file    Gets together: Not on file    Attends religious service: Not on file    Active member of club or organization: Not on file    Attends meetings of clubs or organizations: Not on file    Relationship status: Not on file  Other Topics Concern  . Not on file  Social History Narrative  . Not on file     Family History:  The patient's ***family history includes Diabetes in his father; Hypertension in his  father.   ROS:   Please see the history of present illness.    ROS All other systems reviewed and are negative.   PHYSICAL EXAM:   VS:  There were no vitals taken for this visit.  Physical Exam  GEN: Well nourished, well developed, in no acute distress  HEENT: normal  Neck: no JVD, carotid bruits, or masses Cardiac:RRR; no murmurs, rubs, or gallops  Respiratory:  clear to auscultation bilaterally, normal work of breathing GI: soft, nontender, nondistended, + BS Ext: without cyanosis, clubbing, or edema, Good distal pulses bilaterally MS: no deformity or atrophy  Skin: warm and dry, no rash Neuro:  Alert and Oriented x 3, Strength and sensation are intact Psych: euthymic mood, full affect  Wt Readings from Last 3 Encounters:  02/08/18 160 lb (72.6 kg)  02/06/18 160 lb (72.6 kg)  01/30/18 157 lb 8 oz (71.4 kg)      Studies/Labs Reviewed:   EKG:  EKG is*** ordered today.  The ekg ordered today demonstrates ***  Recent Labs: 08/07/2017: ALT 16; Platelets 137 02/08/2018: BUN 23; Creatinine, Ser 7.60; Hemoglobin 13.3; Potassium 4.1; Sodium 140   Lipid Panel    Component Value Date/Time   CHOL 199 10/04/2007 2023   TRIG 154 (H) 10/04/2007 2023   HDL 101 10/04/2007 2023   CHOLHDL 2.0 Ratio 10/04/2007 2023   VLDL 31 10/04/2007 2023   LDLCALC 67 10/04/2007 2023    Additional studies/ records that were reviewed today include:  ***    ASSESSMENT:    No diagnosis found.   PLAN:  In order of problems listed above:      Medication Adjustments/Labs and Tests Ordered: Current medicines are reviewed at length with the patient today.  Concerns regarding medicines are outlined above.  Medication changes, Labs and Tests ordered today are listed in the Patient Instructions below. There are no Patient Instructions on file for this visit.   Raymond Boast, PA-C  03/14/2018 10:31 AM    [facility] [place], [place], [place]   [address] Phone: [phone]; Fax: [phone]

## 2018-03-15 ENCOUNTER — Encounter: Payer: Self-pay | Admitting: Physician Assistant

## 2018-03-15 DIAGNOSIS — Z992 Dependence on renal dialysis: Secondary | ICD-10-CM | POA: Diagnosis not present

## 2018-03-15 DIAGNOSIS — I871 Compression of vein: Secondary | ICD-10-CM | POA: Diagnosis not present

## 2018-03-15 DIAGNOSIS — N186 End stage renal disease: Secondary | ICD-10-CM | POA: Diagnosis not present

## 2018-03-19 DIAGNOSIS — N186 End stage renal disease: Secondary | ICD-10-CM | POA: Diagnosis not present

## 2018-03-19 DIAGNOSIS — D631 Anemia in chronic kidney disease: Secondary | ICD-10-CM | POA: Diagnosis not present

## 2018-03-19 DIAGNOSIS — N2581 Secondary hyperparathyroidism of renal origin: Secondary | ICD-10-CM | POA: Diagnosis not present

## 2018-03-19 DIAGNOSIS — D509 Iron deficiency anemia, unspecified: Secondary | ICD-10-CM | POA: Diagnosis not present

## 2018-03-20 ENCOUNTER — Ambulatory Visit (INDEPENDENT_AMBULATORY_CARE_PROVIDER_SITE_OTHER): Payer: Medicare Other | Admitting: Vascular Surgery

## 2018-03-20 ENCOUNTER — Other Ambulatory Visit: Payer: Self-pay

## 2018-03-20 ENCOUNTER — Ambulatory Visit (HOSPITAL_COMMUNITY)
Admission: RE | Admit: 2018-03-20 | Discharge: 2018-03-20 | Disposition: A | Discharge status: Home / Self Care | Payer: Medicare Other | Source: Ambulatory Visit | Attending: Vascular Surgery | Admitting: Vascular Surgery

## 2018-03-20 ENCOUNTER — Encounter: Payer: Self-pay | Admitting: *Deleted

## 2018-03-20 ENCOUNTER — Encounter: Payer: Self-pay | Admitting: Vascular Surgery

## 2018-03-20 VITALS — BP 152/90 | HR 65 | Temp 97.0°F | Resp 16 | Ht 67.0 in | Wt 157.0 lb

## 2018-03-20 DIAGNOSIS — I739 Peripheral vascular disease, unspecified: Secondary | ICD-10-CM | POA: Diagnosis not present

## 2018-03-20 DIAGNOSIS — I868 Varicose veins of other specified sites: Secondary | ICD-10-CM | POA: Diagnosis not present

## 2018-03-20 DIAGNOSIS — I70219 Atherosclerosis of native arteries of extremities with intermittent claudication, unspecified extremity: Secondary | ICD-10-CM

## 2018-03-20 NOTE — Progress Notes (Deleted)
Vascular and Vein Specialist of [facility]  Patient name: Raymond [name] MRN: [id number] DOB: [birth year] Sex: male  REASON FOR VISIT: ***  HPI: Raymond [name] is a 62 y.o. male ***  Past Medical History:  Diagnosis Date  . Anemia   . COPD (chronic obstructive pulmonary disease) ([place])   . Dyspnea    "with too much fluid"  . ESRD (end stage renal disease) on dialysis [facility])    "MWF; Jeneen Rinks" (07/22/17)  . Hemodialysis patient ([place])   . Hepatitis C    Still positive s/p liver biopsy at [facility]  and interferon therapy for 6 months. Most recent lab work was on 10/24/12  . Hepatitis C    "took the tx; gone now" (12/05/2016)  Was treated  . History of blood transfusion ~ 2012/2013   "related to my kidneys; blood was low"  . Hypertension   . Substance abuse ([place])   . Thyroid disease     Family History  Problem Relation Age of Onset  . Diabetes Father   . Hypertension Father     SOCIAL HISTORY: Social History   Tobacco Use  . Smoking status: Current Every Day Smoker    Packs/day: 1.00    Years: 25.00    Pack years: 25.00    Types: Cigarettes    Last attempt to quit: 08/01/2011    Years since quitting: 6.6  . Smokeless tobacco: Never Used  Substance Use Topics  . Alcohol use: Yes    Comment: occasionally    No Known Allergies  Current Outpatient Medications  Medication Sig Dispense Refill  . acetaminophen (TYLENOL) 325 MG tablet Take 2 tablets (650 mg total) by mouth every 6 (six) hours as needed. 30 tablet 0  . albuterol (PROVENTIL HFA;VENTOLIN HFA) 108 (90 Base) MCG/ACT inhaler Inhale 2 puffs into the lungs every 6 (six) hours as needed for wheezing or shortness of breath. 1 Inhaler 2  . allopurinol (ZYLOPRIM) 100 MG tablet Take 200 mg by mouth daily.  0  . amLODipine (NORVASC) 5 MG tablet Take 1 tablet (5 mg total) by mouth daily. 90 tablet 1  . gabapentin (NEURONTIN) 300 MG capsule TAKE 1 CAPSULE BY MOUTH AT BEDTIME AS NEEDED FOR  SLEEP 90 capsule 3  . metoprolol succinate (TOPROL-XL) 25 MG 24 hr tablet Take 2 tablets (50 mg total) by mouth daily. (Patient taking differently: Take 50 mg by mouth 2 (two) times daily. ) 120 tablet 1  . sevelamer (RENAGEL) 800 MG tablet Take 800 mg by mouth See admin instructions. Take 800mg  by mouth 3 times daily with meals and 1600 twice daily with snacks as needed     No current facility-administered medications for this visit.    Facility-Administered Medications Ordered in Other Visits  Medication Dose Route Frequency Provider Last Rate Last Dose  . midazolam (VERSED) 5 MG/5ML injection    Anesthesia Intra-op Sammie Bench, CRNA   1 mg at 05/17/17 1631    REVIEW OF SYSTEMS:  [X]  denotes positive finding, [ ]  denotes negative finding Cardiac  Comments:  Chest pain or chest pressure: ***   Shortness of breath upon exertion:    Short of breath when lying flat:    Irregular heart rhythm:        Vascular    Pain in calf, thigh, or hip brought on by ambulation:    Pain in feet at night that wakes you up from your sleep:     Blood clot in your  veins:    Leg swelling:           PHYSICAL EXAM: Vitals:   03/20/18 1523 03/20/18 1528  BP: (!) 148/93 (!) 152/90  Pulse: 65 65  Resp: 16   Temp: (!) 97 F (36.1 C)   TempSrc: Oral   SpO2: 100%   Weight: 157 lb (71.2 kg)   Height: 5\' 7"  (1.702 m)     GENERAL: The patient is a well-nourished male, in no acute distress. The vital signs are documented above. CARDIOVASCULAR: *** PULMONARY: There is good air exchange  MUSCULOSKELETAL: There are no major deformities or cyanosis. NEUROLOGIC: No focal weakness or paresthesias are detected. SKIN: There are no ulcers or rashes noted. PSYCHIATRIC: The patient has a normal affect.  DATA:  ***  MEDICAL ISSUES: ***    Rosetta Posner, MD FACS [facility]ists of [facility] Tel [phone] Pager [phone]                                        Vascular and Vein Specialist of [facility]  Patient name: Raymond [name] MRN: [id number] DOB: 02/01/1956 Sex: male  REASON FOR VISIT: Gust recent outpatient arteriogram and plan left femoropopliteal bypass  HPI: Raymond [name] is a 62 y.o. male here today for continued discussion.  He had undergone outpatient arteriogram 02/08/2018.  This showed bilateral superficial femoral artery occlusion reconstitution of below-knee popliteal artery bilaterally with mild tibial vessel disease.  He reports that he does a great deal of walking by necessity and is unable to do this due to his claudication.  He reports that the left leg is worse than the right leg.  Fortunately he has had no tissue loss.  He is here today for further discussion and for vein map  Past Medical History:  Diagnosis Date  . Anemia   . COPD (chronic obstructive pulmonary disease) ([place])   . Dyspnea    "with too much fluid"  . ESRD (end stage renal disease) on dialysis [facility])    "MWF; Jeneen Rinks" (07/22/17)  . Hemodialysis patient ([place])   . Hepatitis C    Still positive s/p liver biopsy at [facility]  and interferon therapy for 6 months. Most recent lab work was on 10/24/12  . Hepatitis C    "took the tx; gone now" (12/05/2016)  Was treated  . History of blood transfusion ~ 2012/2013   "related to my kidneys; blood was low"  . Hypertension   . Substance abuse ([place])   . Thyroid disease     Family History  Problem Relation Age of Onset  . Diabetes Father   . Hypertension Father     SOCIAL HISTORY: Social History   Tobacco Use  . Smoking status: Current Every Day Smoker    Packs/day: 1.00    Years: 25.00    Pack years: 25.00    Types: Cigarettes    Last attempt to quit: 08/01/2011    Years since quitting: 6.6  . Smokeless tobacco: Never Used  Substance Use Topics  . Alcohol use: Yes    Comment: occasionally    No Known Allergies  Current Outpatient Medications  Medication Sig Dispense Refill  . acetaminophen (TYLENOL)  325 MG tablet Take 2 tablets (650 mg total) by mouth every 6 (six) hours as needed. 30 tablet 0  . albuterol (PROVENTIL HFA;VENTOLIN HFA) 108 (90 Base) MCG/ACT  inhaler Inhale 2 puffs into the lungs every 6 (six) hours as needed for wheezing or shortness of breath. 1 Inhaler 2  . allopurinol (ZYLOPRIM) 100 MG tablet Take 200 mg by mouth daily.  0  . amLODipine (NORVASC) 5 MG tablet Take 1 tablet (5 mg total) by mouth daily. 90 tablet 1  . gabapentin (NEURONTIN) 300 MG capsule TAKE 1 CAPSULE BY MOUTH AT BEDTIME AS NEEDED FOR SLEEP 90 capsule 3  . metoprolol succinate (TOPROL-XL) 25 MG 24 hr tablet Take 2 tablets (50 mg total) by mouth daily. (Patient taking differently: Take 50 mg by mouth 2 (two) times daily. ) 120 tablet 1  . sevelamer (RENAGEL) 800 MG tablet Take 800 mg by mouth See admin instructions. Take 800mg  by mouth 3 times daily with meals and 1600 twice daily with snacks as needed     No current facility-administered medications for this visit.    Facility-Administered Medications Ordered in Other Visits  Medication Dose Route Frequency Provider Last Rate Last Dose  . midazolam (VERSED) 5 MG/5ML injection    Anesthesia Intra-op Sammie Bench, CRNA   1 mg at 05/17/17 1631    REVIEW OF SYSTEMS:  [X]  denotes positive finding, [ ]  denotes negative finding Cardiac  Comments:  Chest pain or chest pressure:    Shortness of breath upon exertion:    Short of breath when lying flat:    Irregular heart rhythm:        Vascular    Pain in calf, thigh, or hip brought on by ambulation: x   Pain in feet at night that wakes you up from your sleep:     Blood clot in your veins:    Leg swelling:           PHYSICAL EXAM: Vitals:   03/20/18 1523 03/20/18 1528  BP: (!) 148/93 (!) 152/90  Pulse: 65 65  Resp: 16   Temp: (!) 97 F (36.1 C)   TempSrc: Oral   SpO2: 100%   Weight: 157 lb (71.2 kg)   Height: 5\' 7"  (1.702 m)     GENERAL: The patient is a well-nourished male, in no  acute distress. The vital signs are documented above. CARDIOVASCULAR: Palpable femoral pulses and absent pedal pulses bilaterally.  He does have varicosities in his left calf and a superficial saphenous vein throughout its course on the left with some varicosity in the midportion of his thigh by physical exam PULMONARY: There is good air exchange  MUSCULOSKELETAL: There are no major deformities or cyanosis. NEUROLOGIC: No focal weakness or paresthesias are detected. SKIN: There are no ulcers or rashes noted. PSYCHIATRIC: The patient has a normal affect.  DATA:  Vein map shows patency of the saphenous vein with some varicosities in the saphenous vein itself.  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  I explained that this is not limb threatening.  He reports that he is unable to tolerate this level of claudication due to his need for extensive walking.  I have recommended a left femoral to below-knee popliteal bypass with great saphenous vein.  Explained the magnitude of the procedure and expected hospitalization.  Also explained the potential for graft failure.  He understands and wished to proceed as soon as possible.  We will coordinate this around his hemodialysis schedule    Rosetta Posner, MD [facility] Vascular and Vein Specialists of [facility] Tel [phone] Pager [phone]

## 2018-03-21 ENCOUNTER — Telehealth: Payer: Self-pay | Admitting: *Deleted

## 2018-03-21 NOTE — Telephone Encounter (Signed)
In attempt to reach patient, Call to brother Katrina Stack who stated patient does not live with him and sister Hilda Blades who stated patient is not living with her and they do not know how to get info to Mr. Rosenow. Left a message with Social Worker at [place] to form a plan to get info to patient for cardiology appt and surgery info.

## 2018-03-23 ENCOUNTER — Telehealth: Payer: Self-pay

## 2018-03-23 NOTE — Telephone Encounter (Signed)
   Primary Cardiologist:Peter Johnsie Cancel, MD  Chart reviewed as part of pre-operative protocol coverage. Because of Raymond [name]'s past medical history and time since last visit, he/she will require a follow-up visit in order to better assess preoperative cardiovascular risk.  Pre-op covering staff: - Please schedule appointment and call patient to inform them. - Please contact requesting surgeon's office via preferred method (i.e, phone, fax) to inform them of need for appointment prior to surgery.  Kathyrn Drown, NP  03/23/2018, 11:31 AM

## 2018-03-23 NOTE — Telephone Encounter (Signed)
Attempted to contact [facility] again to see if patient was available. He had not arrived for dialysis yet.

## 2018-03-23 NOTE — Telephone Encounter (Signed)
   [facility] Pre-operative Risk Assessment    Request for surgical clearance:  1. What type of surgery is being performed? Left Fem-Pop Bypass  2. When is this surgery scheduled? 04/11/18  3. What type of clearance is required (medical clearance vs. Pharmacy clearance to hold med vs. Both)? Medical  4. Are there any medications that need to be held prior to surgery and how long? None   5. Practice name and name of physician performing surgery? [facility]  6. What is your office phone number [phone]   7.   What is your office fax number? n/a  8.   Anesthesia type (None, local, MAC, general)? unknown   Raymond [name] 03/23/2018, 11:24 AM  _________________________________________________________________   (provider comments below)

## 2018-03-23 NOTE — Telephone Encounter (Signed)
Attempted to contact the dialysis center [facility]) to get in touch with social worker because of patients inability to get in contact. Will fax a note to [facility] for them to tell patient to contact us to schedule an appointment to be seen. Will wait to hear from patient to schedule to be cleared for surgery.

## 2018-03-23 NOTE — Telephone Encounter (Deleted)
   Primary Cardiologist:Peter Johnsie Cancel, MD  Chart reviewed as part of pre-operative protocol coverage. Because of Raymond [name]'s past medical history and time since last visit, he/she will require a follow-up visit in order to better assess preoperative cardiovascular risk.  Pre-op covering staff: - Please schedule appointment and call patient to inform them. - Please contact requesting surgeon's office via preferred method (i.e, phone, fax) to inform them of need for appointment prior to surgery.  Kathyrn Drown, NP  03/23/2018, 11:31 AM

## 2018-03-26 DIAGNOSIS — N2581 Secondary hyperparathyroidism of renal origin: Secondary | ICD-10-CM | POA: Diagnosis not present

## 2018-03-26 DIAGNOSIS — D631 Anemia in chronic kidney disease: Secondary | ICD-10-CM | POA: Diagnosis not present

## 2018-03-26 DIAGNOSIS — D509 Iron deficiency anemia, unspecified: Secondary | ICD-10-CM | POA: Diagnosis not present

## 2018-03-26 DIAGNOSIS — N186 End stage renal disease: Secondary | ICD-10-CM | POA: Diagnosis not present

## 2018-03-26 NOTE — Telephone Encounter (Signed)
Pt returned my call and he has been scheduled to see Winona Legato, NP 03/27/18 @ 1:30.

## 2018-03-26 NOTE — Telephone Encounter (Signed)
Left a message for pt to call back re: needing an appt for surgical clearance.

## 2018-03-27 ENCOUNTER — Encounter: Payer: Self-pay | Admitting: Cardiology

## 2018-03-27 ENCOUNTER — Ambulatory Visit (INDEPENDENT_AMBULATORY_CARE_PROVIDER_SITE_OTHER): Payer: Medicare Other | Admitting: Cardiology

## 2018-03-27 VITALS — BP 134/82 | HR 88 | Ht 68.0 in | Wt 162.8 lb

## 2018-03-27 DIAGNOSIS — N186 End stage renal disease: Secondary | ICD-10-CM

## 2018-03-27 DIAGNOSIS — Z992 Dependence on renal dialysis: Secondary | ICD-10-CM

## 2018-03-27 DIAGNOSIS — I739 Peripheral vascular disease, unspecified: Secondary | ICD-10-CM

## 2018-03-27 DIAGNOSIS — I209 Angina pectoris, unspecified: Secondary | ICD-10-CM | POA: Diagnosis not present

## 2018-03-27 DIAGNOSIS — I70219 Atherosclerosis of native arteries of extremities with intermittent claudication, unspecified extremity: Secondary | ICD-10-CM

## 2018-03-27 DIAGNOSIS — Z01818 Encounter for other preprocedural examination: Secondary | ICD-10-CM | POA: Diagnosis not present

## 2018-03-27 MED ORDER — ATORVASTATIN CALCIUM 40 MG PO TABS: 40.0000 mg | ORAL_TABLET | Freq: Every day | ORAL | 3 refills | Status: DC

## 2018-03-27 MED ORDER — CLOPIDOGREL BISULFATE 75 MG PO TABS: 75.0000 mg | ORAL_TABLET | Freq: Once | ORAL | 0 refills | Status: DC

## 2018-03-27 NOTE — Patient Instructions (Addendum)
Medication Instructions:   Start Lipitor 40mg  every night at bedtime  Take Aspirin 81mg  Morning of procedure    Labwork:Your physician recommends that you have bloodwork today BMET, CBC, LIPID     Testing/Procedures: Arrive at Short Stay at [facility] at [place] on Thursday 03/29/18.[name] Fasting but please take your meds with a sip of water.    Your physician has requested that you have a cardiac catheterization. Cardiac catheterization is used to diagnose and/or treat various heart conditions. Doctors may recommend this procedure for a number of different reasons. The most common reason is to evaluate chest pain. Chest pain can be a symptom of coronary artery disease (CAD), and cardiac catheterization can show whether plaque is narrowing or blocking your heart's arteries. This procedure is also used to evaluate the valves, as well as measure the blood flow and oxygen levels in different parts of your heart. For further information please visit [email or web address]. Please follow instruction sheet, as given.        Follow-Up: After Heart Catheterization       Any Other Special Instructions Will Be Listed Below (If Applicable).     If you need a refill on your cardiac medications before your next appointment, please call your pharmacy.

## 2018-03-27 NOTE — Progress Notes (Signed)
.   Cardiology Office Note   Date:  03/27/2018   ID:  JEVIN CAMINO, DOB Apr 18, 1956, MRN [id number]  PCP:  Scot Jun, FNP  Cardiologist:  Remote Dr. Johnsie Cancel     Chief Complaint  Patient presents with  . Pre-op Exam      History of Present Illness: Raymond [name] is a 62 y.o. male who is being seen today for the evaluation of cardiac eval for surgery of Left fem-pop bypass.for 04/11/18 at the request of Dr. Donnetta Hutching.   Pt remotely saw Dr. Johnsie Cancel in 2015.  For chest pain.  EKG at that time with diffuse J point elevation and possible pericarditis. ETT was ordered but never done.   Other hx with ESRD on HD MWF, hepatitis, COPD HTN, thyroid disease   CT angio of chest 12.2018 with multivessel coronary vascular disease Rt atrial enlargement.  Cardiomegaly,    Echo 2018 with EF 55-60% G1 DD, Ascending aortic diameter: 42 mm (S). Mildly dilated, PA pk pressure 47 mmHg G1DD  Possible small PFO.    Today he does report chest pain and dyspnea with dialysis resolves.  Also with waking up Raymond [name] he develops mid sternal chest pressure and dyspnea.  along with leg pain.  When he stops walking chest pain resolves.  It has occurred at home without activity.   He no longer does cocaine.  + tobacco use we discussed him stopping and rare alcohol.    Pt unable to take BP meds due to hypotension with dialysis.  His amlodipine and BB are prn only.  Not on statin.       Past Medical History:  Diagnosis Date  . Anemia   . COPD (chronic obstructive pulmonary disease) ([place])   . Dyspnea    "with too much fluid"  . ESRD (end stage renal disease) on dialysis [facility])    "MWF; Jeneen Rinks" (07/22/17)  . Hemodialysis patient ([place])   . Hepatitis C    Still positive s/p liver biopsy at [facility]  and interferon therapy for 6 months. Most recent lab work was on 10/24/12  . Hepatitis C    "took the tx; gone now" (12/05/2016)  Was treated  . History of blood transfusion ~ 2012/2013   "related to my kidneys; blood  was low"  . Hypertension   . Substance abuse ([place])   . Thyroid disease     Past Surgical History:  Procedure Laterality Date  . ABDOMINAL AORTOGRAM W/LOWER EXTREMITY N/A 02/08/2018   Procedure: ABDOMINAL AORTOGRAM W/LOWER EXTREMITY;  Surgeon: Conrad [place], MD;  Location: [place] CV LAB;  Service: Cardiovascular;  Laterality: N/A;  . AV FISTULA PLACEMENT Left Aug. 2013 ?  [name] LIVER BIOPSY    . ORIF ULNAR FRACTURE Left 05/18/2017   Procedure: OPEN REDUCTION INTERNAL FIXATION (ORIF) ULNAR FRACTURE;  Surgeon: Iran Planas, MD;  Location: [place];  Service: Orthopedics;  Laterality: Left;  . SHUNTOGRAM N/A 12/11/2012   Procedure: Earney Mallet;  Surgeon: Serafina Mitchell, MD;  Location: [facility] CATH LAB;  Service: Cardiovascular;  Laterality: N/A;     Current Outpatient Medications  Medication Sig Dispense Refill  . acetaminophen (TYLENOL) 325 MG tablet Take 2 tablets (650 mg total) by mouth every 6 (six) hours as needed. 30 tablet 0  . albuterol (PROVENTIL HFA;VENTOLIN HFA) 108 (90 Base) MCG/ACT inhaler Inhale 2 puffs into the lungs every 6 (six) hours as needed for wheezing or shortness of breath. 1 Inhaler 2  . allopurinol (ZYLOPRIM) 100 MG tablet  Take 200 mg by mouth daily.  0  . cinacalcet (SENSIPAR) 30 MG tablet Take 1 tablet by mouth daily.    [name] gabapentin (NEURONTIN) 300 MG capsule TAKE 1 CAPSULE BY MOUTH AT BEDTIME AS NEEDED FOR SLEEP 90 capsule 3  . RENVELA 800 MG tablet Use as directed  6  . sevelamer (RENAGEL) 800 MG tablet Take 800 mg by mouth See admin instructions. Take 800mg  by mouth 3 times daily with meals and 1600 twice daily with snacks as needed    . amLODipine (NORVASC) 5 MG tablet Take 1 tablet (5 mg total) by mouth daily. (Patient not taking: Reported on 03/27/2018) 90 tablet 1  . atorvastatin (LIPITOR) 40 MG tablet Take 1 tablet (40 mg total) by mouth daily. 90 tablet 3  . metoprolol succinate (TOPROL-XL) 25 MG 24 hr tablet Take 2 tablets (50 mg total) by mouth daily. (Patient  not taking: Reported on 03/27/2018) 120 tablet 1   No current facility-administered medications for this visit.    Facility-Administered Medications Ordered in Other Visits  Medication Dose Route Frequency Provider Last Rate Last Dose  . midazolam (VERSED) 5 MG/5ML injection    Anesthesia Intra-op Sammie Bench, CRNA   1 mg at 05/17/17 1631    Allergies:   Patient has no known allergies.    Social History:  The patient  reports that he has been smoking cigarettes. He has a 25.00 pack-year smoking history. He has never used smokeless tobacco. He reports that he drinks alcohol. He reports that he has current or past drug history. Drug: Cocaine.   Family History:  The patient's family history includes Diabetes in his father; Heart disease in his father; Hypertension in his father.    ROS:  General:no colds or fevers, no weight changes Skin:no rashes or ulcers HEENT:no blurred vision, no congestion CV:see HPI PUL:see HPI GI:no diarrhea constipation or melena, no indigestion GU:no hematuria, no dysuria MS:no joint pain, no claudication Neuro:no syncope, no lightheadedness Endo:no diabetes, no thyroid disease  Wt Readings from Last 3 Encounters:  03/27/18 162 lb 12.8 oz (73.8 kg)  03/20/18 157 lb (71.2 kg)  02/08/18 160 lb (72.6 kg)     PHYSICAL EXAM: VS:  BP 134/82   Pulse 88   Ht 5\' 8"  (1.727 m)   Wt 162 lb 12.8 oz (73.8 kg)   SpO2 97%   BMI 24.75 kg/m  , BMI Body mass index is 24.75 kg/m. General:Pleasant affect, NAD Skin:Warm and dry, brisk capillary refill HEENT:normocephalic, sclera clear, mucus membranes moist Neck:supple, no JVD, no bruits  Heart:S1S2 RRR without murmur, gallup, rub or click Lungs:clear without rales, rhonchi, or wheezes CNO:BSJG, non tender, + BS, do not palpate liver spleen or masses Ext:Lt  lower ext edema, and pain at ankle. 2+ radial pulses Neuro:alert and oriented X 3, MAE, follows commands, + facial symmetry    EKG:  EKG is  ordered today. The ekg ordered today demonstrates SR minimal criteria for LVH.    Recent Labs: 08/07/2017: ALT 16; Platelets 137 02/08/2018: BUN 23; Creatinine, Ser 7.60; Hemoglobin 13.3; Potassium 4.1; Sodium 140    Lipid Panel    Component Value Date/Time   CHOL 199 10/04/2007 2023   TRIG 154 (H) 10/04/2007 2023   HDL 101 10/04/2007 2023   CHOLHDL 2.0 Ratio 10/04/2007 2023   VLDL 31 10/04/2007 2023   LDLCALC 67 10/04/2007 2023       Other studies Reviewed: Additional studies/ records that were reviewed today include: .  Echo  12/06/16  Study Conclusions  - Left ventricle: The cavity size was normal. Wall thickness was   increased in a pattern of moderate LVH. Systolic function was   normal. The estimated ejection fraction was in the range of 55%   to 60%. Wall motion was normal; there were no regional wall   motion abnormalities. Doppler parameters are consistent with   abnormal left ventricular relaxation (grade 1 diastolic   dysfunction). The E/e&' ratio is between 8-15, suggesting   indeterminate LV filling pressure. - Aorta: Ascending aortic diameter: 42 mm (S). - Ascending aorta: The ascending aorta was mildly dilated. - Mitral valve: Calcified annulus. There was trivial regurgitation. - Left atrium: Moderately dilated. - Right atrium: The atrium was mildly dilated. - Atrial septum: Mobile IAS - cannot exclude small PFO. - Tricuspid valve: There was mild regurgitation. - Pulmonary arteries: PA peak pressure: 47 mm Hg (S). - Systemic veins: The IVC measures >2.1 cm, but collapses more than   50%, suggesting an elevated RA pressure of 8 mmHg.  Impressions:  - LVEF 55-60%, moderate LVH, normal wall motion, grade 1 DD with   indeterminate LV filling pressure, mildly dilated ascending aorta   to 4.2 cm, trivial MR, moderate LAE, mild RAE, mobile interatrial   septum, mild TR, RVSP 47 mmHg, dilated IVC suggestive of elevated   RA pressure of 8 mmHg.  ASSESSMENT  AND PLAN:  1.  Pre-op eval for fem pop bypass on left, high risk with angina with exertion - discussed with Dr. Marlou Porch and will need cardiac cath  Discussed at length with pt.  The patient understands that risks included but are not limited to stroke (1 in 1000), death (1 in 39), kidney failure [usually temporary] (1 in 500), bleeding (1 in 200), allergic reaction [possibly serious] (1 in 200).   2.  PAD with need for fem pol bypass - depending on cardiac cath.  3.  Will check lipids today.   4.  Tobacco use, discussed befits of stopping.  5.  ESRD on HD MWF  Plan for cardiac cath on Thursday this week.    Current medicines are reviewed with the patient today.  The patient Has no concerns regarding medicines.  The following changes have been made:  See above Labs/ tests ordered today include:see above  Disposition:   FU:  see above  Signed, Cecilie Kicks, NP  03/27/2018 5:17 PM    [facility] [address], [place], [place], [place] Phone: [phone]; Fax: [phone]

## 2018-03-27 NOTE — H&P (View-Only) (Signed)
.   Cardiology Office Note   Date:  03/27/2018   ID:  Raymond [name], DOB 1955-11-09, MRN [id number]  PCP:  Scot Jun, FNP  Cardiologist:  Remote Dr. Johnsie Cancel     Chief Complaint  Patient presents with  . Pre-op Exam      History of Present Illness: Raymond [name] is a 63 y.o. male who is being seen today for the evaluation of cardiac eval for surgery of Left fem-pop bypass.for 04/11/18 at the request of Dr. Donnetta Hutching.   Pt remotely saw Dr. Johnsie Cancel in 2015.  For chest pain.  EKG at that time with diffuse J point elevation and possible pericarditis. ETT was ordered but never done.   Other hx with ESRD on HD MWF, hepatitis, COPD HTN, thyroid disease   CT angio of chest 12.2018 with multivessel coronary vascular disease Rt atrial enlargement.  Cardiomegaly,    Echo 2018 with EF 55-60% G1 DD, Ascending aortic diameter: 42 mm (S). Mildly dilated, PA pk pressure 47 mmHg G1DD  Possible small PFO.    Today he does report chest pain and dyspnea with dialysis resolves.  Also with waking up [place] he develops mid sternal chest pressure and dyspnea.  along with leg pain.  When he stops walking chest pain resolves.  It has occurred at home without activity.   He no longer does cocaine.  + tobacco use we discussed him stopping and rare alcohol.    Pt unable to take BP meds due to hypotension with dialysis.  His amlodipine and BB are prn only.  Not on statin.       Past Medical History:  Diagnosis Date  . Anemia   . COPD (chronic obstructive pulmonary disease) ([place])   . Dyspnea    "with too much fluid"  . ESRD (end stage renal disease) on dialysis [facility])    "MWF; Jeneen Rinks" (07/22/17)  . Hemodialysis patient ([place])   . Hepatitis C    Still positive s/p liver biopsy at [facility]  and interferon therapy for 6 months. Most recent lab work was on 10/24/12  . Hepatitis C    "took the tx; gone now" (12/05/2016)  Was treated  . History of blood transfusion ~ 2012/2013   "related to my kidneys; blood  was low"  . Hypertension   . Substance abuse ([place])   . Thyroid disease     Past Surgical History:  Procedure Laterality Date  . ABDOMINAL AORTOGRAM W/LOWER EXTREMITY N/A 02/08/2018   Procedure: ABDOMINAL AORTOGRAM W/LOWER EXTREMITY;  Surgeon: Conrad Felt, MD;  Location: [place] CV LAB;  Service: Cardiovascular;  Laterality: N/A;  . AV FISTULA PLACEMENT Left Aug. 2013 ?  [name] LIVER BIOPSY    . ORIF ULNAR FRACTURE Left 05/18/2017   Procedure: OPEN REDUCTION INTERNAL FIXATION (ORIF) ULNAR FRACTURE;  Surgeon: Iran Planas, MD;  Location: [place];  Service: Orthopedics;  Laterality: Left;  . SHUNTOGRAM N/A 12/11/2012   Procedure: Earney Mallet;  Surgeon: Serafina Mitchell, MD;  Location: [facility] CATH LAB;  Service: Cardiovascular;  Laterality: N/A;     Current Outpatient Medications  Medication Sig Dispense Refill  . acetaminophen (TYLENOL) 325 MG tablet Take 2 tablets (650 mg total) by mouth every 6 (six) hours as needed. 30 tablet 0  . albuterol (PROVENTIL HFA;VENTOLIN HFA) 108 (90 Base) MCG/ACT inhaler Inhale 2 puffs into the lungs every 6 (six) hours as needed for wheezing or shortness of breath. 1 Inhaler 2  . allopurinol (ZYLOPRIM) 100 MG tablet  Take 200 mg by mouth daily.  0  . cinacalcet (SENSIPAR) 30 MG tablet Take 1 tablet by mouth daily.    [name] gabapentin (NEURONTIN) 300 MG capsule TAKE 1 CAPSULE BY MOUTH AT BEDTIME AS NEEDED FOR SLEEP 90 capsule 3  . RENVELA 800 MG tablet Use as directed  6  . sevelamer (RENAGEL) 800 MG tablet Take 800 mg by mouth See admin instructions. Take 800mg  by mouth 3 times daily with meals and 1600 twice daily with snacks as needed    . amLODipine (NORVASC) 5 MG tablet Take 1 tablet (5 mg total) by mouth daily. (Patient not taking: Reported on 03/27/2018) 90 tablet 1  . atorvastatin (LIPITOR) 40 MG tablet Take 1 tablet (40 mg total) by mouth daily. 90 tablet 3  . metoprolol succinate (TOPROL-XL) 25 MG 24 hr tablet Take 2 tablets (50 mg total) by mouth daily. (Patient  not taking: Reported on 03/27/2018) 120 tablet 1   No current facility-administered medications for this visit.    Facility-Administered Medications Ordered in Other Visits  Medication Dose Route Frequency Provider Last Rate Last Dose  . midazolam (VERSED) 5 MG/5ML injection    Anesthesia Intra-op Sammie Bench, CRNA   1 mg at 05/17/17 1631    Allergies:   Patient has no known allergies.    Social History:  The patient  reports that he has been smoking cigarettes. He has a 25.00 pack-year smoking history. He has never used smokeless tobacco. He reports that he drinks alcohol. He reports that he has current or past drug history. Drug: Cocaine.   Family History:  The patient's family history includes Diabetes in his father; Heart disease in his father; Hypertension in his father.    ROS:  General:no colds or fevers, no weight changes Skin:no rashes or ulcers HEENT:no blurred vision, no congestion CV:see HPI PUL:see HPI GI:no diarrhea constipation or melena, no indigestion GU:no hematuria, no dysuria MS:no joint pain, no claudication Neuro:no syncope, no lightheadedness Endo:no diabetes, no thyroid disease  Wt Readings from Last 3 Encounters:  03/27/18 162 lb 12.8 oz (73.8 kg)  03/20/18 157 lb (71.2 kg)  02/08/18 160 lb (72.6 kg)     PHYSICAL EXAM: VS:  BP 134/82   Pulse 88   Ht 5\' 8"  (1.727 m)   Wt 162 lb 12.8 oz (73.8 kg)   SpO2 97%   BMI 24.75 kg/m  , BMI Body mass index is 24.75 kg/m. General:Pleasant affect, NAD Skin:Warm and dry, brisk capillary refill HEENT:normocephalic, sclera clear, mucus membranes moist Neck:supple, no JVD, no bruits  Heart:S1S2 RRR without murmur, gallup, rub or click Lungs:clear without rales, rhonchi, or wheezes FKC:LEXN, non tender, + BS, do not palpate liver spleen or masses Ext:Lt  lower ext edema, and pain at ankle. 2+ radial pulses Neuro:alert and oriented X 3, MAE, follows commands, + facial symmetry    EKG:  EKG is  ordered today. The ekg ordered today demonstrates SR minimal criteria for LVH.    Recent Labs: 08/07/2017: ALT 16; Platelets 137 02/08/2018: BUN 23; Creatinine, Ser 7.60; Hemoglobin 13.3; Potassium 4.1; Sodium 140    Lipid Panel    Component Value Date/Time   CHOL 199 10/04/2007 2023   TRIG 154 (H) 10/04/2007 2023   HDL 101 10/04/2007 2023   CHOLHDL 2.0 Ratio 10/04/2007 2023   VLDL 31 10/04/2007 2023   LDLCALC 67 10/04/2007 2023       Other studies Reviewed: Additional studies/ records that were reviewed today include: .  Echo  12/06/16  Study Conclusions  - Left ventricle: The cavity size was normal. Wall thickness was   increased in a pattern of moderate LVH. Systolic function was   normal. The estimated ejection fraction was in the range of 55%   to 60%. Wall motion was normal; there were no regional wall   motion abnormalities. Doppler parameters are consistent with   abnormal left ventricular relaxation (grade 1 diastolic   dysfunction). The E/e&' ratio is between 8-15, suggesting   indeterminate LV filling pressure. - Aorta: Ascending aortic diameter: 42 mm (S). - Ascending aorta: The ascending aorta was mildly dilated. - Mitral valve: Calcified annulus. There was trivial regurgitation. - Left atrium: Moderately dilated. - Right atrium: The atrium was mildly dilated. - Atrial septum: Mobile IAS - cannot exclude small PFO. - Tricuspid valve: There was mild regurgitation. - Pulmonary arteries: PA peak pressure: 47 mm Hg (S). - Systemic veins: The IVC measures >2.1 cm, but collapses more than   50%, suggesting an elevated RA pressure of 8 mmHg.  Impressions:  - LVEF 55-60%, moderate LVH, normal wall motion, grade 1 DD with   indeterminate LV filling pressure, mildly dilated ascending aorta   to 4.2 cm, trivial MR, moderate LAE, mild RAE, mobile interatrial   septum, mild TR, RVSP 47 mmHg, dilated IVC suggestive of elevated   RA pressure of 8 mmHg.  ASSESSMENT  AND PLAN:  1.  Pre-op eval for fem pop bypass on left, high risk with angina with exertion - discussed with Dr. Marlou Porch and will need cardiac cath  Discussed at length with pt.  The patient understands that risks included but are not limited to stroke (1 in 1000), death (1 in 22), kidney failure [usually temporary] (1 in 500), bleeding (1 in 200), allergic reaction [possibly serious] (1 in 200).   2.  PAD with need for fem pol bypass - depending on cardiac cath.  3.  Will check lipids today.   4.  Tobacco use, discussed befits of stopping.  5.  ESRD on HD MWF  Plan for cardiac cath on Thursday this week.    Current medicines are reviewed with the patient today.  The patient Has no concerns regarding medicines.  The following changes have been made:  See above Labs/ tests ordered today include:see above  Disposition:   FU:  see above  Signed, Cecilie Kicks, NP  03/27/2018 5:17 PM    [facility] [place], [place], [place], [place] Phone: [phone]; Fax: [phone]

## 2018-03-28 DIAGNOSIS — N186 End stage renal disease: Secondary | ICD-10-CM | POA: Diagnosis not present

## 2018-03-28 DIAGNOSIS — D509 Iron deficiency anemia, unspecified: Secondary | ICD-10-CM | POA: Diagnosis not present

## 2018-03-28 DIAGNOSIS — N2581 Secondary hyperparathyroidism of renal origin: Secondary | ICD-10-CM | POA: Diagnosis not present

## 2018-03-28 DIAGNOSIS — D631 Anemia in chronic kidney disease: Secondary | ICD-10-CM | POA: Diagnosis not present

## 2018-03-28 LAB — CBC WITH DIFFERENTIAL/PLATELET
BASOS ABS: 0 10*3/uL (ref 0.0–0.2)
BASOS: 0 %
EOS (ABSOLUTE): 0.1 10*3/uL (ref 0.0–0.4)
Eos: 1 %
HEMATOCRIT: 32.7 % — AB (ref 37.5–51.0)
HEMOGLOBIN: 11 g/dL — AB (ref 13.0–17.7)
IMMATURE GRANS (ABS): 0 10*3/uL (ref 0.0–0.1)
Immature Granulocytes: 0 %
LYMPHS: 24 %
Lymphocytes Absolute: 0.9 10*3/uL (ref 0.7–3.1)
MCH: 33.8 pg — AB (ref 26.6–33.0)
MCHC: 33.6 g/dL (ref 31.5–35.7)
MCV: 101 fL — AB (ref 79–97)
MONOCYTES: 17 %
Monocytes Absolute: 0.6 10*3/uL (ref 0.1–0.9)
NEUTROS ABS: 2 10*3/uL (ref 1.4–7.0)
Neutrophils: 58 %
Platelets: 174 10*3/uL (ref 150–450)
RBC: 3.25 x10E6/uL — ABNORMAL LOW (ref 4.14–5.80)
RDW: 14.6 % (ref 12.3–15.4)
WBC: 3.5 10*3/uL (ref 3.4–10.8)

## 2018-03-28 LAB — BASIC METABOLIC PANEL
BUN / CREAT RATIO: 4 — AB (ref 10–24)
BUN: 57 mg/dL — AB (ref 8–27)
CALCIUM: 7.8 mg/dL — AB (ref 8.6–10.2)
CHLORIDE: 96 mmol/L (ref 96–106)
CO2: 25 mmol/L (ref 20–29)
Creatinine, Ser: 13.23 mg/dL — ABNORMAL HIGH (ref 0.76–1.27)
GFR calc non Af Amer: 3 mL/min/{1.73_m2} — ABNORMAL LOW (ref >59)
GFR, EST AFRICAN AMERICAN: 4 mL/min/{1.73_m2} — AB (ref >59)
Glucose: 81 mg/dL (ref 65–99)
POTASSIUM: 6.3 mmol/L — AB (ref 3.5–5.2)
Sodium: 144 mmol/L (ref 134–144)

## 2018-03-28 LAB — LIPID PANEL
CHOL/HDL RATIO: 2.1 ratio (ref 0.0–5.0)
Cholesterol, Total: 158 mg/dL (ref 100–199)
HDL: 74 mg/dL (ref >39)
LDL CALC: 69 mg/dL (ref 0–99)
TRIGLYCERIDES: 74 mg/dL (ref 0–149)
VLDL Cholesterol Cal: 15 mg/dL (ref 5–40)

## 2018-03-28 NOTE — Progress Notes (Signed)
Notified by [facility] of critical K 6.8 drawn yesterday in the clinic. Attempted to call pt at contact number given in the chart but was unable to reach him. Pt is dialysis patient and has treatments on MWF, so due for hemodialysis later this AM where presumably the hyperkalemia will be addressed.

## 2018-03-29 ENCOUNTER — Encounter: Payer: Self-pay | Admitting: *Deleted

## 2018-03-29 ENCOUNTER — Ambulatory Visit (HOSPITAL_BASED_OUTPATIENT_CLINIC_OR_DEPARTMENT_OTHER)
Admission: RE | Admit: 2018-03-29 | Discharge: 2018-03-29 | Disposition: A | Discharge status: Home / Self Care | Payer: Medicare Other | Source: Ambulatory Visit | Attending: Internal Medicine | Admitting: Internal Medicine

## 2018-03-29 ENCOUNTER — Inpatient Hospital Stay (HOSPITAL_COMMUNITY)
Admission: RE | Disposition: A | Discharge status: Home / Self Care | Payer: Self-pay | Source: Ambulatory Visit | Attending: Internal Medicine

## 2018-03-29 ENCOUNTER — Other Ambulatory Visit: Payer: Self-pay | Admitting: *Deleted

## 2018-03-29 DIAGNOSIS — I160 Hypertensive urgency: Secondary | ICD-10-CM | POA: Diagnosis not present

## 2018-03-29 DIAGNOSIS — R6511 Systemic inflammatory response syndrome (SIRS) of non-infectious origin with acute organ dysfunction: Secondary | ICD-10-CM | POA: Diagnosis not present

## 2018-03-29 DIAGNOSIS — Z01810 Encounter for preprocedural cardiovascular examination: Secondary | ICD-10-CM | POA: Insufficient documentation

## 2018-03-29 DIAGNOSIS — B1920 Unspecified viral hepatitis C without hepatic coma: Secondary | ICD-10-CM

## 2018-03-29 DIAGNOSIS — E162 Hypoglycemia, unspecified: Secondary | ICD-10-CM | POA: Diagnosis not present

## 2018-03-29 DIAGNOSIS — E7220 Disorder of urea cycle metabolism, unspecified: Secondary | ICD-10-CM | POA: Diagnosis not present

## 2018-03-29 DIAGNOSIS — I2510 Atherosclerotic heart disease of native coronary artery without angina pectoris: Secondary | ICD-10-CM | POA: Diagnosis not present

## 2018-03-29 DIAGNOSIS — F17210 Nicotine dependence, cigarettes, uncomplicated: Secondary | ICD-10-CM | POA: Insufficient documentation

## 2018-03-29 DIAGNOSIS — Z992 Dependence on renal dialysis: Secondary | ICD-10-CM

## 2018-03-29 DIAGNOSIS — I739 Peripheral vascular disease, unspecified: Secondary | ICD-10-CM | POA: Insufficient documentation

## 2018-03-29 DIAGNOSIS — N186 End stage renal disease: Secondary | ICD-10-CM | POA: Insufficient documentation

## 2018-03-29 DIAGNOSIS — I953 Hypotension of hemodialysis: Secondary | ICD-10-CM | POA: Insufficient documentation

## 2018-03-29 DIAGNOSIS — I1311 Hypertensive heart and chronic kidney disease without heart failure, with stage 5 chronic kidney disease, or end stage renal disease: Secondary | ICD-10-CM | POA: Diagnosis not present

## 2018-03-29 DIAGNOSIS — I4581 Long QT syndrome: Secondary | ICD-10-CM | POA: Diagnosis not present

## 2018-03-29 DIAGNOSIS — R918 Other nonspecific abnormal finding of lung field: Secondary | ICD-10-CM | POA: Diagnosis not present

## 2018-03-29 DIAGNOSIS — N2581 Secondary hyperparathyroidism of renal origin: Secondary | ICD-10-CM | POA: Diagnosis not present

## 2018-03-29 DIAGNOSIS — R0789 Other chest pain: Secondary | ICD-10-CM | POA: Diagnosis present

## 2018-03-29 DIAGNOSIS — J449 Chronic obstructive pulmonary disease, unspecified: Secondary | ICD-10-CM

## 2018-03-29 DIAGNOSIS — Z8249 Family history of ischemic heart disease and other diseases of the circulatory system: Secondary | ICD-10-CM

## 2018-03-29 DIAGNOSIS — I120 Hypertensive chronic kidney disease with stage 5 chronic kidney disease or end stage renal disease: Secondary | ICD-10-CM | POA: Insufficient documentation

## 2018-03-29 DIAGNOSIS — R531 Weakness: Secondary | ICD-10-CM | POA: Diagnosis not present

## 2018-03-29 DIAGNOSIS — E875 Hyperkalemia: Secondary | ICD-10-CM | POA: Diagnosis not present

## 2018-03-29 DIAGNOSIS — A419 Sepsis, unspecified organism: Secondary | ICD-10-CM | POA: Diagnosis not present

## 2018-03-29 DIAGNOSIS — R4182 Altered mental status, unspecified: Secondary | ICD-10-CM | POA: Diagnosis not present

## 2018-03-29 DIAGNOSIS — E079 Disorder of thyroid, unspecified: Secondary | ICD-10-CM | POA: Insufficient documentation

## 2018-03-29 DIAGNOSIS — Z23 Encounter for immunization: Secondary | ICD-10-CM | POA: Diagnosis not present

## 2018-03-29 DIAGNOSIS — R310 Gross hematuria: Secondary | ICD-10-CM | POA: Diagnosis not present

## 2018-03-29 HISTORY — PX: LEFT HEART CATH AND CORONARY ANGIOGRAPHY: CATH118249

## 2018-03-29 LAB — BASIC METABOLIC PANEL
ANION GAP: 15 (ref 5–15)
BUN: 32 mg/dL — ABNORMAL HIGH (ref 8–23)
CO2: 29 mmol/L (ref 22–32)
Calcium: 7.6 mg/dL — ABNORMAL LOW (ref 8.9–10.3)
Chloride: 97 mmol/L — ABNORMAL LOW (ref 98–111)
Creatinine, Ser: 9.77 mg/dL — ABNORMAL HIGH (ref 0.61–1.24)
GFR calc Af Amer: 6 mL/min — ABNORMAL LOW (ref >60)
GFR calc non Af Amer: 5 mL/min — ABNORMAL LOW (ref >60)
GLUCOSE: 87 mg/dL (ref 70–99)
POTASSIUM: 5.2 mmol/L — AB (ref 3.5–5.1)
SODIUM: 141 mmol/L (ref 135–145)

## 2018-03-29 SURGERY — LEFT HEART CATH AND CORONARY ANGIOGRAPHY
Anesthesia: LOCAL

## 2018-03-29 MED ORDER — LIDOCAINE HCL (PF) 1 % IJ SOLN
INTRAMUSCULAR | Status: DC | PRN
  Administered 2018-03-29: 20 mL

## 2018-03-29 MED ORDER — VERAPAMIL HCL 2.5 MG/ML IV SOLN
INTRAVENOUS | Status: AC
  Filled 2018-03-29: qty 2

## 2018-03-29 MED ORDER — SODIUM CHLORIDE 0.9% FLUSH: 3.0000 mL | Freq: Two times a day (BID) | INTRAVENOUS | Status: DC

## 2018-03-29 MED ORDER — HEPARIN (PORCINE) IN NACL 1000-0.9 UT/500ML-% IV SOLN
INTRAVENOUS | Status: AC
Start: 2018-03-29 — End: ?
  Filled 2018-03-29: qty 1000

## 2018-03-29 MED ORDER — HYDRALAZINE HCL 20 MG/ML IJ SOLN
INTRAMUSCULAR | Status: AC
  Filled 2018-03-29: qty 1

## 2018-03-29 MED ORDER — HYDRALAZINE HCL 20 MG/ML IJ SOLN
10.0000 mg | Freq: Once | INTRAMUSCULAR | Status: AC
  Administered 2018-03-29: 10 mg via INTRAVENOUS

## 2018-03-29 MED ORDER — SODIUM CHLORIDE 0.9% FLUSH: 3.0000 mL | INTRAVENOUS | Status: DC | PRN

## 2018-03-29 MED ORDER — MIDAZOLAM HCL 2 MG/2ML IJ SOLN
INTRAMUSCULAR | Status: AC
  Filled 2018-03-29: qty 2

## 2018-03-29 MED ORDER — HEPARIN (PORCINE) IN NACL 1000-0.9 UT/500ML-% IV SOLN
INTRAVENOUS | Status: DC | PRN
  Administered 2018-03-29 (×2): 500 mL

## 2018-03-29 MED ORDER — SODIUM CHLORIDE 0.9 % IV SOLN
INTRAVENOUS | Status: DC
  Administered 2018-03-29: 08:00:00 via INTRAVENOUS

## 2018-03-29 MED ORDER — SODIUM CHLORIDE 0.9 % IV SOLN: 250.0000 mL | INTRAVENOUS | Status: DC | PRN

## 2018-03-29 MED ORDER — ONDANSETRON HCL 4 MG/2ML IJ SOLN: 4.0000 mg | Freq: Four times a day (QID) | INTRAMUSCULAR | Status: DC | PRN

## 2018-03-29 MED ORDER — MIDAZOLAM HCL 2 MG/2ML IJ SOLN
INTRAMUSCULAR | Status: DC | PRN
  Administered 2018-03-29: 2 mg via INTRAVENOUS

## 2018-03-29 MED ORDER — ACETAMINOPHEN 325 MG PO TABS: 650.0000 mg | ORAL_TABLET | ORAL | Status: DC | PRN

## 2018-03-29 MED ORDER — FENTANYL CITRATE (PF) 100 MCG/2ML IJ SOLN
INTRAMUSCULAR | Status: DC | PRN
  Administered 2018-03-29: 50 ug via INTRAVENOUS

## 2018-03-29 MED ORDER — HEPARIN (PORCINE) IN NACL 1000-0.9 UT/500ML-% IV SOLN
INTRAVENOUS | Status: AC
  Filled 2018-03-29: qty 1000

## 2018-03-29 MED ORDER — LIDOCAINE HCL (PF) 1 % IJ SOLN
INTRAMUSCULAR | Status: AC
  Filled 2018-03-29: qty 30

## 2018-03-29 MED ORDER — ASPIRIN 81 MG PO CHEW
81.0000 mg | CHEWABLE_TABLET | ORAL | Status: AC
  Administered 2018-03-29: 81 mg via ORAL
  Filled 2018-03-29: qty 1

## 2018-03-29 MED ORDER — FENTANYL CITRATE (PF) 100 MCG/2ML IJ SOLN
INTRAMUSCULAR | Status: AC
  Filled 2018-03-29: qty 2

## 2018-03-29 SURGICAL SUPPLY — 13 items
CATH INFINITI 5 FR MPA2 (CATHETERS) ×1 IMPLANT
CATH INFINITI 5FR JL5 (CATHETERS) ×1 IMPLANT
CATH INFINITI 5FR MULTPACK ANG (CATHETERS) ×1 IMPLANT
KIT HEART LEFT (KITS) ×2 IMPLANT
KIT MICROPUNCTURE NIT STIFF (SHEATH) ×1 IMPLANT
PACK CARDIAC CATHETERIZATION (CUSTOM PROCEDURE TRAY) ×2 IMPLANT
SHEATH AVANTI 5FR 23CM (SHEATH) ×1 IMPLANT
SHEATH PROBE COVER 6X72 (BAG) ×2 IMPLANT
SYR MEDRAD MARK V 150ML (SYRINGE) ×2 IMPLANT
TRANSDUCER W/STOPCOCK (MISCELLANEOUS) ×2 IMPLANT
TUBING CIL FLEX 10 FLL-RA (TUBING) ×2 IMPLANT
WIRE EMERALD 3MM-J .035X150CM (WIRE) ×2 IMPLANT
WIRE HI TORQ VERSACORE-J 145CM (WIRE) ×1 IMPLANT

## 2018-03-29 NOTE — Interval H&P Note (Signed)
History and Physical Interval Note:  03/29/2018 12:21 PM  Raymond [name]  has presented today for cardiac catheterization, with the diagnosis of atypical chest pain and preop evaluation. The various methods of treatment have been discussed with the patient and family. After consideration of risks, benefits and other options for treatment, the patient has consented to  Procedure(s): LEFT HEART CATH AND CORONARY ANGIOGRAPHY (N/A) as a surgical intervention .  The patient's history has been reviewed, patient examined, no change in status, stable for surgery.  I have reviewed the patient's chart and labs.  Questions were answered to the patient's satisfaction.    Cath Lab Visit (complete for each Cath Lab visit)  Clinical Evaluation Leading to the Procedure:   ACS: No.  Non-ACS:    Anginal Classification: CCS IV  Anti-ischemic medical therapy: Maximal Therapy (2 or more classes of medications)  Non-Invasive Test Results: No non-invasive testing performed  Prior CABG: No previous CABG  Raymond [name]

## 2018-03-29 NOTE — Brief Op Note (Signed)
BRIEF CARDIAC CATHETERIZATION NOTE  03/29/2018  3:32 PM  PATIENT:  Raymond [name]  62 y.o. male  PRE-OPERATIVE DIAGNOSIS:  Atypical chest pain, preop evaluation  POST-OPERATIVE DIAGNOSIS:  Non-obstructive CAD.  PROCEDURE:  Procedure(s): LEFT HEART CATH AND CORONARY ANGIOGRAPHY (N/A)  SURGEON:  Surgeon(s) and Role:    * Steadman Prosperi, MD - Primary  FINDINGS: 1. Mild, non-obstructive CAD. 2. Normal LVEF and LVEDP.  RECOMMENDATIONS: 1. Medical therapy. 2. Ok to proceed with vascular surgery from cardiac standpoint. 3. Proceed with scheduled dialysis tomorrow.  Nelva Bush, MD [facility] Pager: [phone]

## 2018-03-29 NOTE — Discharge Instructions (Signed)

## 2018-03-29 NOTE — Progress Notes (Addendum)
Site area: Left groin a french long arterial sheath was removed  Site Prior to Removal:  Level 0  Pressure Applied For 20 MINUTES    Bedrest Beginning at 1615p  Manual:   Yes.    Patient Status During Pull:  stable  Post Pull Groin Site:  Level 0  Post Pull Instructions Given:  Yes.    Post Pull Pulses Present:  Yes.    Dressing Applied:  Yes.    Comments:

## 2018-03-30 ENCOUNTER — Telehealth: Payer: Self-pay | Admitting: Cardiology

## 2018-03-30 ENCOUNTER — Encounter (HOSPITAL_COMMUNITY): Payer: Self-pay | Admitting: Internal Medicine

## 2018-03-30 ENCOUNTER — Telehealth: Payer: Self-pay | Admitting: *Deleted

## 2018-03-30 MED FILL — Verapamil HCl IV Soln 2.5 MG/ML: INTRAVENOUS | Qty: 2 | Status: AC

## 2018-03-30 MED FILL — Heparin Sod (Porcine)-NaCl IV Soln 1000 Unit/500ML-0.9%: INTRAVENOUS | Qty: 500 | Status: AC

## 2018-03-30 NOTE — Telephone Encounter (Signed)
Spoke with Raymond [name]  Confirmed she received fax of pre-op instruction letter and patient cleared for surgery for 04-11-18 with Dr. Donnetta Hutching. To call if any questions.

## 2018-03-30 NOTE — Telephone Encounter (Signed)
   Primary Cardiologist: Jenkins Rouge, MD  Chart reviewed as part of pre-operative protocol coverage. Given past medical history and time since last visit, based on ACC/AHA guidelines, Raymond [name] would be at acceptable risk for the planned procedure without further cardiovascular testing.   He has nonobstructive CAD on cath 03/29/18.    I will route this recommendation to the requesting party via Epic fax function and remove from pre-op pool.  Please call with questions.  Cecilie Kicks, NP 03/30/2018, 7:49 AM

## 2018-03-31 ENCOUNTER — Inpatient Hospital Stay (HOSPITAL_COMMUNITY)
Admission: EM | Admit: 2018-03-31 | Discharge: 2018-04-03 | DRG: 871 | Disposition: A | Discharge status: Home / Self Care | Payer: Medicare Other | Attending: Internal Medicine | Admitting: Internal Medicine

## 2018-03-31 ENCOUNTER — Encounter (HOSPITAL_COMMUNITY): Payer: Self-pay | Admitting: Emergency Medicine

## 2018-03-31 ENCOUNTER — Inpatient Hospital Stay (HOSPITAL_COMMUNITY): Payer: Medicare Other

## 2018-03-31 ENCOUNTER — Emergency Department (HOSPITAL_COMMUNITY): Payer: Medicare Other

## 2018-03-31 ENCOUNTER — Other Ambulatory Visit: Payer: Self-pay

## 2018-03-31 DIAGNOSIS — F1910 Other psychoactive substance abuse, uncomplicated: Secondary | ICD-10-CM | POA: Diagnosis present

## 2018-03-31 DIAGNOSIS — E875 Hyperkalemia: Secondary | ICD-10-CM | POA: Diagnosis present

## 2018-03-31 DIAGNOSIS — Z8249 Family history of ischemic heart disease and other diseases of the circulatory system: Secondary | ICD-10-CM | POA: Diagnosis not present

## 2018-03-31 DIAGNOSIS — R918 Other nonspecific abnormal finding of lung field: Secondary | ICD-10-CM | POA: Diagnosis not present

## 2018-03-31 DIAGNOSIS — I2510 Atherosclerotic heart disease of native coronary artery without angina pectoris: Secondary | ICD-10-CM | POA: Diagnosis present

## 2018-03-31 DIAGNOSIS — E162 Hypoglycemia, unspecified: Secondary | ICD-10-CM | POA: Diagnosis not present

## 2018-03-31 DIAGNOSIS — J449 Chronic obstructive pulmonary disease, unspecified: Secondary | ICD-10-CM | POA: Diagnosis present

## 2018-03-31 DIAGNOSIS — I160 Hypertensive urgency: Secondary | ICD-10-CM | POA: Diagnosis present

## 2018-03-31 DIAGNOSIS — D631 Anemia in chronic kidney disease: Secondary | ICD-10-CM | POA: Diagnosis present

## 2018-03-31 DIAGNOSIS — I10 Essential (primary) hypertension: Secondary | ICD-10-CM | POA: Diagnosis present

## 2018-03-31 DIAGNOSIS — Z79899 Other long term (current) drug therapy: Secondary | ICD-10-CM

## 2018-03-31 DIAGNOSIS — Z7982 Long term (current) use of aspirin: Secondary | ICD-10-CM

## 2018-03-31 DIAGNOSIS — Z9115 Patient's noncompliance with renal dialysis: Secondary | ICD-10-CM | POA: Diagnosis not present

## 2018-03-31 DIAGNOSIS — E7800 Pure hypercholesterolemia, unspecified: Secondary | ICD-10-CM | POA: Diagnosis not present

## 2018-03-31 DIAGNOSIS — I4581 Long QT syndrome: Secondary | ICD-10-CM | POA: Diagnosis present

## 2018-03-31 DIAGNOSIS — E785 Hyperlipidemia, unspecified: Secondary | ICD-10-CM | POA: Diagnosis present

## 2018-03-31 DIAGNOSIS — I739 Peripheral vascular disease, unspecified: Secondary | ICD-10-CM | POA: Diagnosis present

## 2018-03-31 DIAGNOSIS — Z23 Encounter for immunization: Secondary | ICD-10-CM | POA: Diagnosis not present

## 2018-03-31 DIAGNOSIS — I70219 Atherosclerosis of native arteries of extremities with intermittent claudication, unspecified extremity: Secondary | ICD-10-CM | POA: Diagnosis not present

## 2018-03-31 DIAGNOSIS — N186 End stage renal disease: Secondary | ICD-10-CM | POA: Diagnosis not present

## 2018-03-31 DIAGNOSIS — D72819 Decreased white blood cell count, unspecified: Secondary | ICD-10-CM | POA: Diagnosis present

## 2018-03-31 DIAGNOSIS — Z992 Dependence on renal dialysis: Secondary | ICD-10-CM

## 2018-03-31 DIAGNOSIS — E872 Acidosis, unspecified: Secondary | ICD-10-CM

## 2018-03-31 DIAGNOSIS — E7220 Disorder of urea cycle metabolism, unspecified: Secondary | ICD-10-CM | POA: Diagnosis present

## 2018-03-31 DIAGNOSIS — A419 Sepsis, unspecified organism: Secondary | ICD-10-CM | POA: Diagnosis not present

## 2018-03-31 DIAGNOSIS — N329 Bladder disorder, unspecified: Secondary | ICD-10-CM | POA: Diagnosis present

## 2018-03-31 DIAGNOSIS — R531 Weakness: Secondary | ICD-10-CM | POA: Diagnosis not present

## 2018-03-31 DIAGNOSIS — K921 Melena: Secondary | ICD-10-CM | POA: Diagnosis not present

## 2018-03-31 DIAGNOSIS — Z833 Family history of diabetes mellitus: Secondary | ICD-10-CM | POA: Diagnosis not present

## 2018-03-31 DIAGNOSIS — R310 Gross hematuria: Secondary | ICD-10-CM | POA: Diagnosis present

## 2018-03-31 DIAGNOSIS — R0902 Hypoxemia: Secondary | ICD-10-CM | POA: Diagnosis not present

## 2018-03-31 DIAGNOSIS — I1311 Hypertensive heart and chronic kidney disease without heart failure, with stage 5 chronic kidney disease, or end stage renal disease: Secondary | ICD-10-CM | POA: Diagnosis present

## 2018-03-31 DIAGNOSIS — N2581 Secondary hyperparathyroidism of renal origin: Secondary | ICD-10-CM | POA: Diagnosis present

## 2018-03-31 DIAGNOSIS — Z8619 Personal history of other infectious and parasitic diseases: Secondary | ICD-10-CM

## 2018-03-31 DIAGNOSIS — R110 Nausea: Secondary | ICD-10-CM

## 2018-03-31 DIAGNOSIS — R319 Hematuria, unspecified: Secondary | ICD-10-CM | POA: Diagnosis not present

## 2018-03-31 DIAGNOSIS — R6511 Systemic inflammatory response syndrome (SIRS) of non-infectious origin with acute organ dysfunction: Secondary | ICD-10-CM | POA: Diagnosis not present

## 2018-03-31 DIAGNOSIS — E161 Other hypoglycemia: Secondary | ICD-10-CM | POA: Diagnosis not present

## 2018-03-31 DIAGNOSIS — F17210 Nicotine dependence, cigarettes, uncomplicated: Secondary | ICD-10-CM | POA: Diagnosis present

## 2018-03-31 DIAGNOSIS — I120 Hypertensive chronic kidney disease with stage 5 chronic kidney disease or end stage renal disease: Secondary | ICD-10-CM | POA: Diagnosis not present

## 2018-03-31 DIAGNOSIS — R6510 Systemic inflammatory response syndrome (SIRS) of non-infectious origin without acute organ dysfunction: Secondary | ICD-10-CM

## 2018-03-31 DIAGNOSIS — R4182 Altered mental status, unspecified: Secondary | ICD-10-CM | POA: Diagnosis present

## 2018-03-31 LAB — AMMONIA: Ammonia: 74 umol/L — ABNORMAL HIGH (ref 9–35)

## 2018-03-31 LAB — I-STAT CG4 LACTIC ACID, ED
LACTIC ACID, VENOUS: 4.16 mmol/L — AB (ref 0.5–1.9)
Lactic Acid, Venous: 3.83 mmol/L (ref 0.5–1.9)

## 2018-03-31 LAB — CBC
HCT: 32.3 % — ABNORMAL LOW (ref 39.0–52.0)
Hemoglobin: 10 g/dL — ABNORMAL LOW (ref 13.0–17.0)
MCH: 33.3 pg (ref 26.0–34.0)
MCHC: 31 g/dL (ref 30.0–36.0)
MCV: 107.7 fL — ABNORMAL HIGH (ref 78.0–100.0)
Platelets: 137 K/uL — ABNORMAL LOW (ref 150–400)
RBC: 3 MIL/uL — ABNORMAL LOW (ref 4.22–5.81)
RDW: 15.4 % (ref 11.5–15.5)
WBC: 3.3 10*3/uL — ABNORMAL LOW (ref 4.0–10.5)

## 2018-03-31 LAB — I-STAT CHEM 8, ED
BUN: 75 mg/dL — AB (ref 8–23)
CHLORIDE: 101 mmol/L (ref 98–111)
Calcium, Ion: 0.87 mmol/L — CL (ref 1.15–1.40)
Creatinine, Ser: 15.1 mg/dL — ABNORMAL HIGH (ref 0.61–1.24)
Glucose, Bld: 155 mg/dL — ABNORMAL HIGH (ref 70–99)
HEMATOCRIT: 37 % — AB (ref 39.0–52.0)
Hemoglobin: 12.6 g/dL — ABNORMAL LOW (ref 13.0–17.0)
POTASSIUM: 7 mmol/L — AB (ref 3.5–5.1)
Sodium: 132 mmol/L — ABNORMAL LOW (ref 135–145)
TCO2: 22 mmol/L (ref 22–32)

## 2018-03-31 LAB — PROTIME-INR
INR: 1.09
PROTHROMBIN TIME: 14 s (ref 11.4–15.2)

## 2018-03-31 LAB — CBC WITH DIFFERENTIAL/PLATELET
Basophils Absolute: 0 10*3/uL (ref 0.0–0.1)
Basophils Relative: 0 %
EOS PCT: 1 %
Eosinophils Absolute: 0 10*3/uL (ref 0.0–0.7)
HCT: 37.2 % — ABNORMAL LOW (ref 39.0–52.0)
Hemoglobin: 11.3 g/dL — ABNORMAL LOW (ref 13.0–17.0)
LYMPHS ABS: 0.4 10*3/uL — AB (ref 0.7–4.0)
Lymphocytes Relative: 15 %
MCH: 33.5 pg (ref 26.0–34.0)
MCHC: 30.4 g/dL (ref 30.0–36.0)
MCV: 110.4 fL — AB (ref 78.0–100.0)
MONO ABS: 0.1 10*3/uL (ref 0.1–1.0)
MONOS PCT: 5 %
NEUTROS ABS: 2.1 10*3/uL (ref 1.7–7.7)
Neutrophils Relative %: 79 %
Platelets: 124 10*3/uL — ABNORMAL LOW (ref 150–400)
RBC: 3.37 MIL/uL — AB (ref 4.22–5.81)
RDW: 15.4 % (ref 11.5–15.5)
WBC: 2.6 10*3/uL — ABNORMAL LOW (ref 4.0–10.5)

## 2018-03-31 LAB — COMPREHENSIVE METABOLIC PANEL
ALBUMIN: 3.4 g/dL — AB (ref 3.5–5.0)
ALK PHOS: 56 U/L (ref 38–126)
ALT: 10 U/L (ref 0–44)
AST: 25 U/L (ref 15–41)
Anion gap: 20 — ABNORMAL HIGH (ref 5–15)
BUN: 63 mg/dL — AB (ref 8–23)
CALCIUM: 7.8 mg/dL — AB (ref 8.9–10.3)
CO2: 18 mmol/L — ABNORMAL LOW (ref 22–32)
CREATININE: 13.9 mg/dL — AB (ref 0.61–1.24)
Chloride: 96 mmol/L — ABNORMAL LOW (ref 98–111)
GFR calc non Af Amer: 3 mL/min — ABNORMAL LOW (ref >60)
GFR, EST AFRICAN AMERICAN: 4 mL/min — AB (ref >60)
GLUCOSE: 151 mg/dL — AB (ref 70–99)
Potassium: 7.1 mmol/L (ref 3.5–5.1)
SODIUM: 134 mmol/L — AB (ref 135–145)
Total Bilirubin: 0.7 mg/dL (ref 0.3–1.2)
Total Protein: 7.3 g/dL (ref 6.5–8.1)

## 2018-03-31 LAB — MAGNESIUM: Magnesium: 2.5 mg/dL — ABNORMAL HIGH (ref 1.7–2.4)

## 2018-03-31 LAB — CBG MONITORING, ED
GLUCOSE-CAPILLARY: 146 mg/dL — AB (ref 70–99)
Glucose-Capillary: 21 mg/dL — CL (ref 70–99)
Glucose-Capillary: 70 mg/dL (ref 70–99)

## 2018-03-31 LAB — TROPONIN I: Troponin I: <0.03 ng/mL (ref <0.03)

## 2018-03-31 LAB — POTASSIUM: POTASSIUM: 3.7 mmol/L (ref 3.5–5.1)

## 2018-03-31 LAB — PROCALCITONIN: Procalcitonin: 0.4 ng/mL

## 2018-03-31 IMAGING — CT CT ABD-PELV W/O CM
2 of 4 series · 16 of 46 positions shown, 18 images · non-contrast
Comparison: None.

CLINICAL DATA: Generalized weakness, nausea, vomiting, hematuria

EXAM:
CT ABDOMEN AND PELVIS WITHOUT CONTRAST
TECHNIQUE: Multidetector CT imaging of the abdomen and pelvis was performed
following the standard protocol without IV contrast.

[Series 3: ap without · axial · non-contrast · 0.65mm/px · z∈[-800,-424]mm · 13 of 85 slices shown, 15 images]
[im 5/85  soft-tissue]
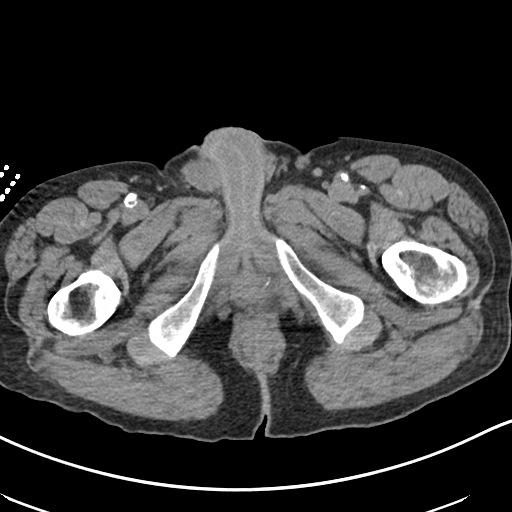
[im 5/85  bone]
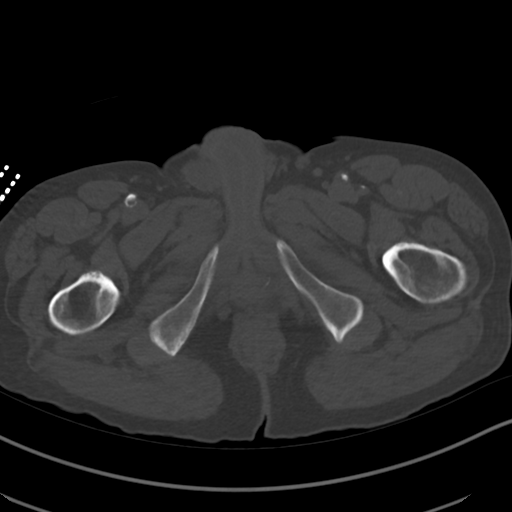
[im 14/85  soft-tissue]
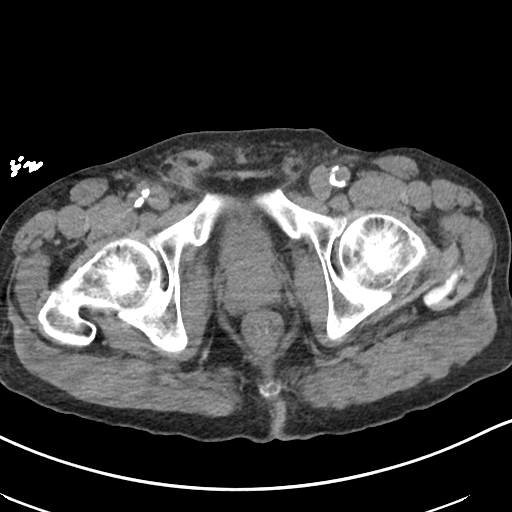
[im 18/85  soft-tissue]
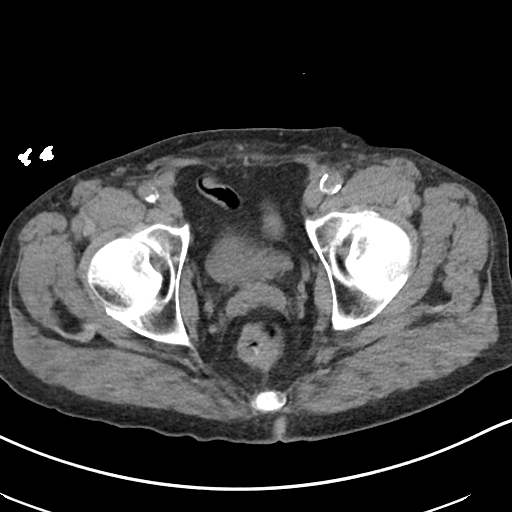
[im 23/85  soft-tissue]
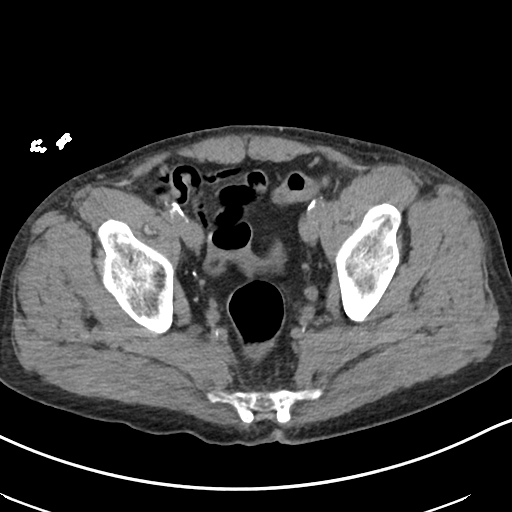
[im 31/85  soft-tissue]
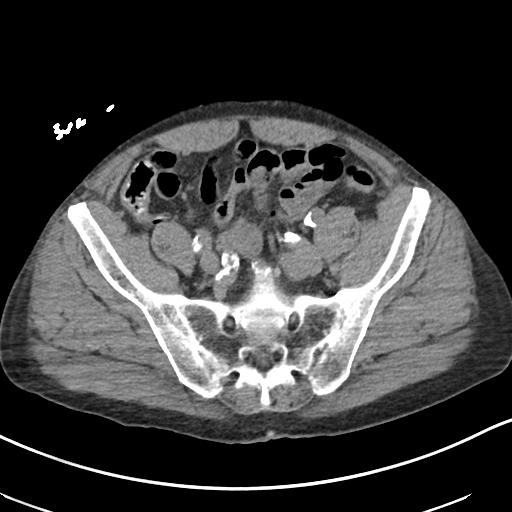
[im 36/85  soft-tissue]
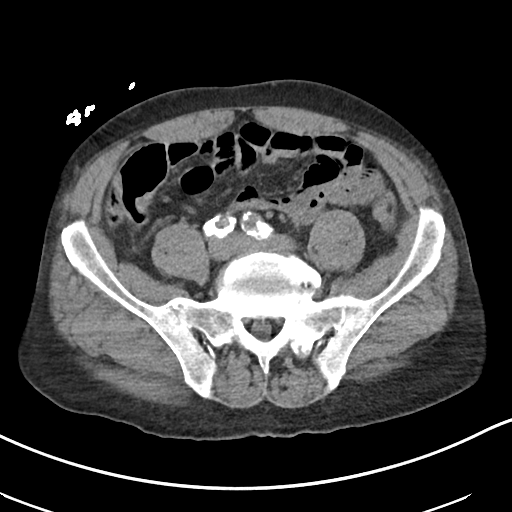
[im 45/85  soft-tissue]
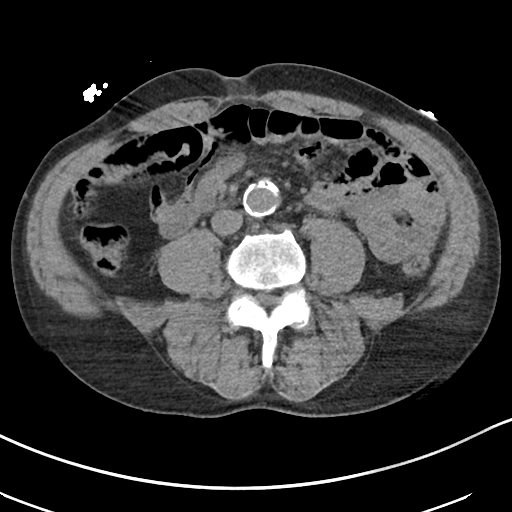
[im 49/85  soft-tissue]
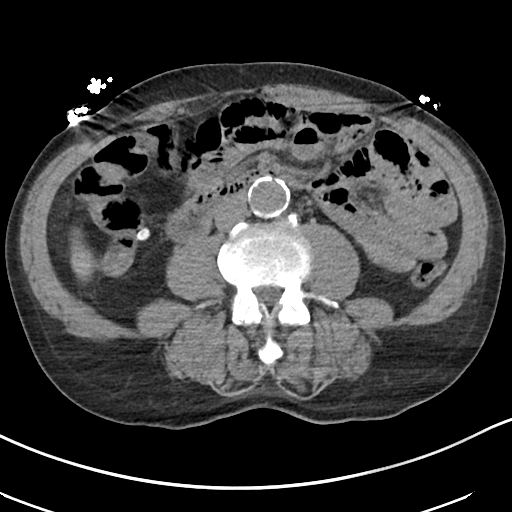
[im 54/85  soft-tissue]
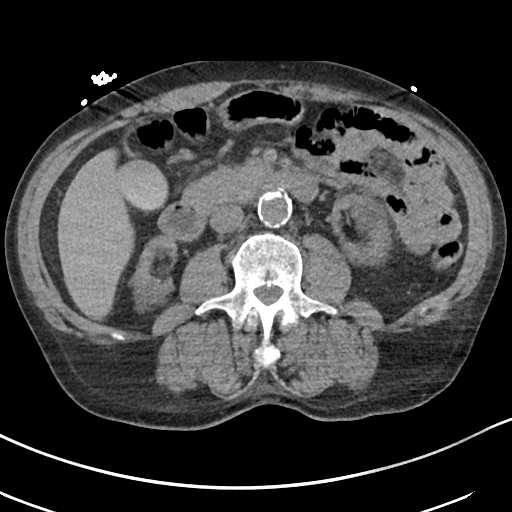
[im 54/85  bone]
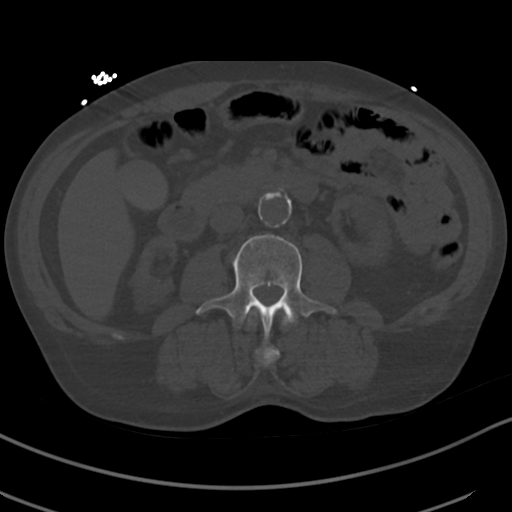
[im 62/85  soft-tissue]
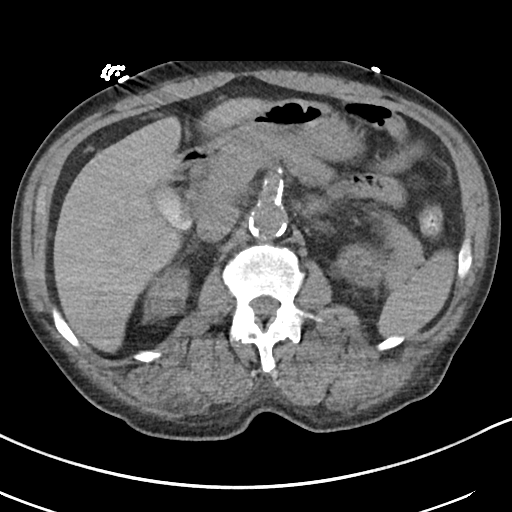
[im 67/85  soft-tissue]
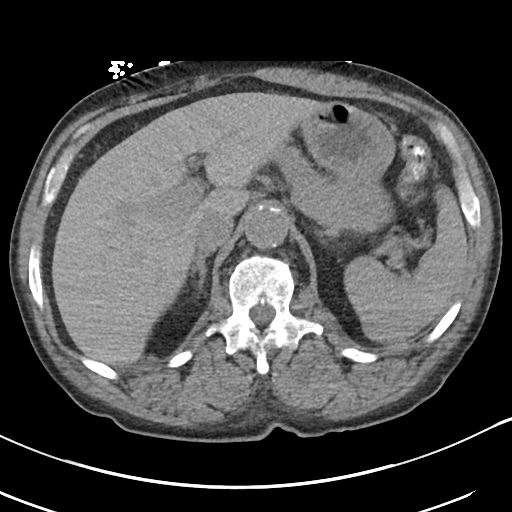
[im 71/85  soft-tissue]
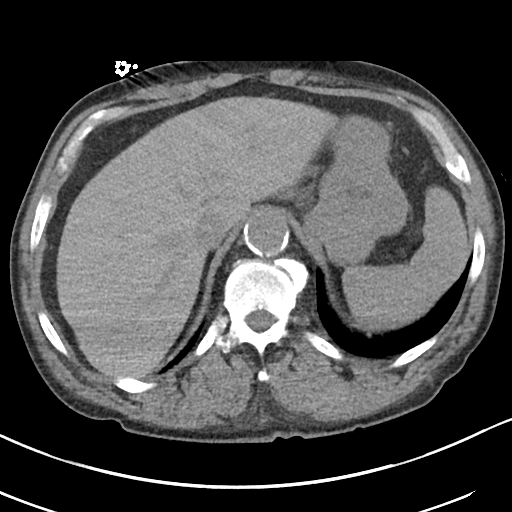
[im 80/85  soft-tissue]
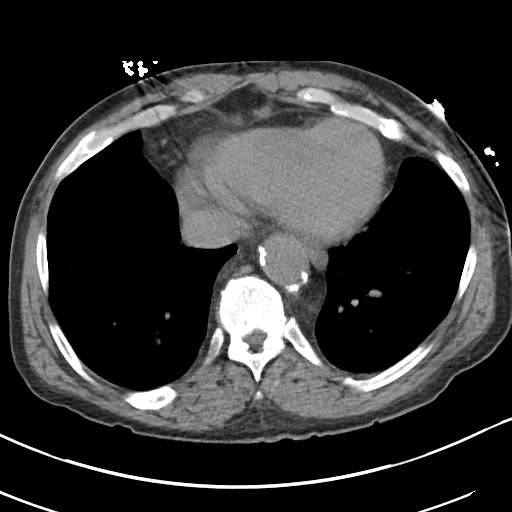

[Series 6: cor · coronal · 0.71mm/px · 3 of 75 slices shown]
[im 25/75  soft-tissue]
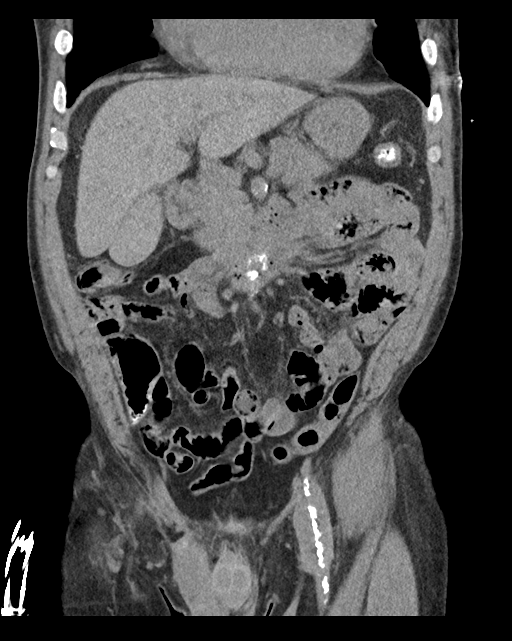
[im 33/75  soft-tissue]
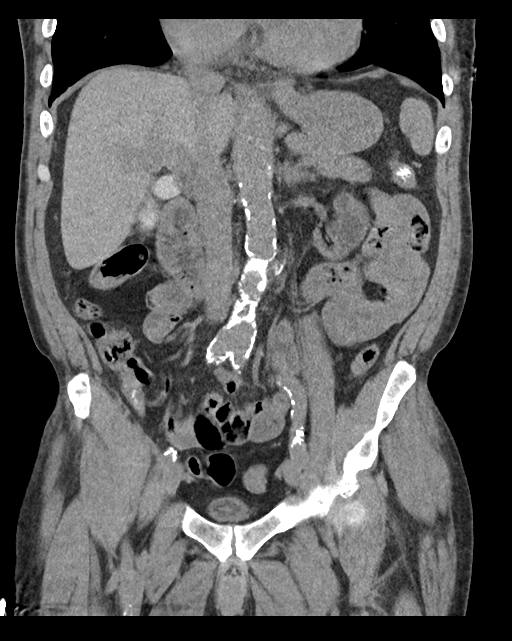
[im 42/75  soft-tissue]
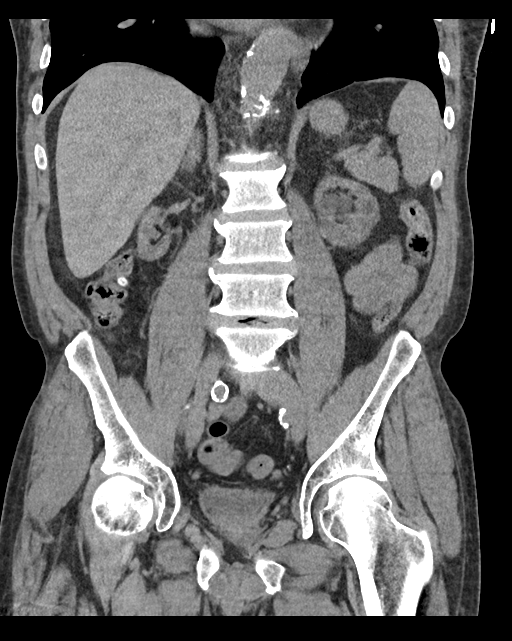

[16 of 46 positions shown; findings below may reference images not displayed]

FINDINGS: Lower chest: Minor basilar atelectasis. Cardiomegaly without
pericardial or pleural effusion. Descending thoracic aorta is
atherosclerotic and mildly ectatic. Degenerative changes of the
spine with osteophytes.

Hepatobiliary: No focal hepatic abnormality or ductal dilatation
within the limits of noncontrast imaging. Hyper attenuation in the
gallbladder compatible with vicarious contrast excretion. Biliary
system is nondilated.

Pancreas: No significant focal abnormality by noncontrast imaging.
No surrounding inflammatory process.

Spleen: Normal in size without focal abnormality.

Adrenals/Urinary Tract: Normal adrenal glands.

Atrophic kidneys with numerous hypodense renal cysts compatible with
end-stage renal disease. No renal obstruction or hydronephrosis. No
hydroureter. Bladder is collapsed with mild wall thickening.

Stomach/Bowel: Negative for bowel obstruction, significant
dilatation, ileus, or free air. Scattered colonic diverticulosis.
Normal appearing appendix containing air. No acute inflammatory
process, fluid collection, or abscess. No ascites.

Vascular/Lymphatic: Atherosclerosis of aorta. Minor ectasia. No
significant aneurysm. No retroperitoneal hemorrhage or hematoma.

No adenopathy.

Reproductive: Prostate gland unremarkable. Seminal vesicles are
symmetric. High positioned right testicle as before.

Other: No abdominal wall hernia or abnormality. No abdominopelvic
ascites.

Musculoskeletal: Degenerative changes noted. No acute osseous
finding.
IMPRESSION: No acute intra-abdominal or pelvic finding by noncontrast CT.

Chronic renal atrophy and renal cysts related to end-stage renal
disease.

Vicarious contrast excretion in the gallbladder noted from recent
contrast for coronary catheterization.

Atherosclerosis as above

## 2018-03-31 IMAGING — CT CT HEAD W/O CM
4 series · 15 of 47 positions shown, 17 images · non-contrast
Comparison: Maxillofacial CT [DATE].  Head CT [DATE].

CLINICAL DATA: Generalized weakness for a couple days. Hematuria.
Unequal pupils.

EXAM:
CT HEAD WITHOUT CONTRAST
TECHNIQUE: Contiguous axial images were obtained from the base of the skull
through the vertex without intravenous contrast.

[Series 3: head wo · axial · 0.46mm/px · z∈[-84,+36]mm · 7 of 32 slices shown, 9 images]
[im 4/32  brain]
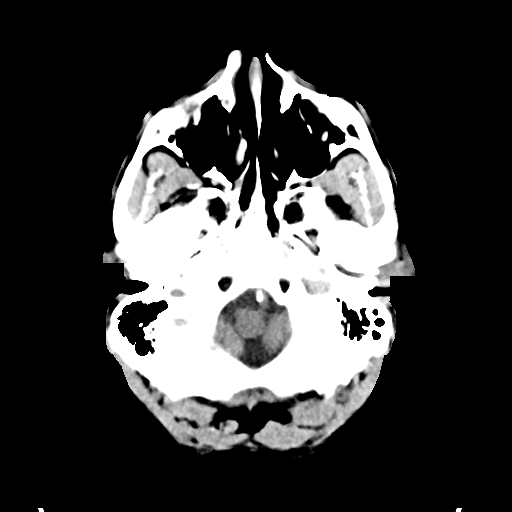
[im 4/32  bone]
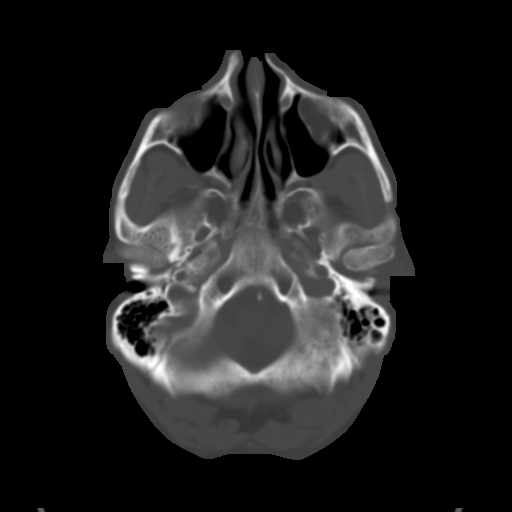
[im 8/32  brain]
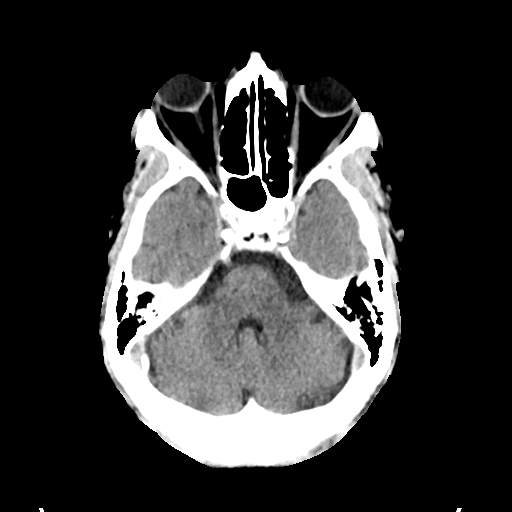
[im 12/32  brain]
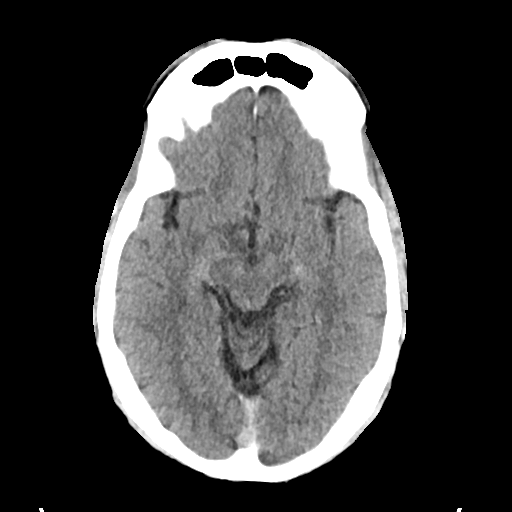
[im 16/32  brain]
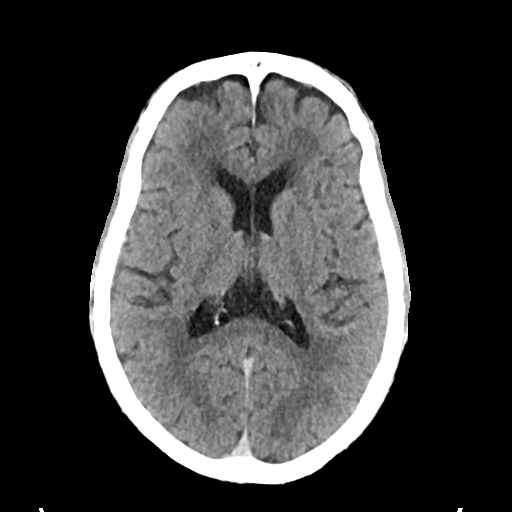
[im 20/32  brain]
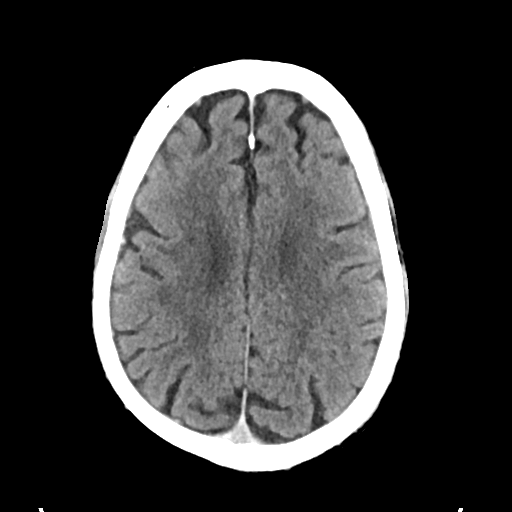
[im 20/32  bone]
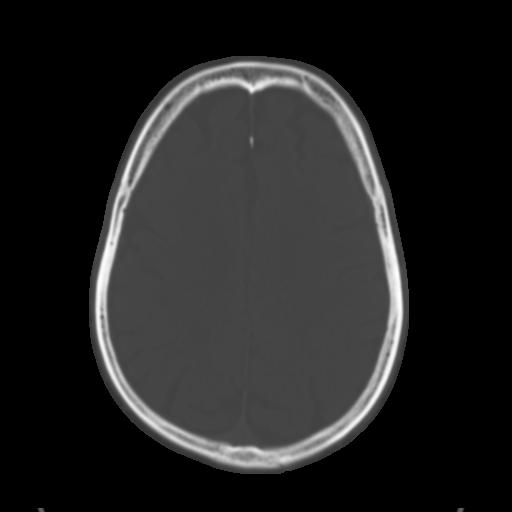
[im 24/32  brain]
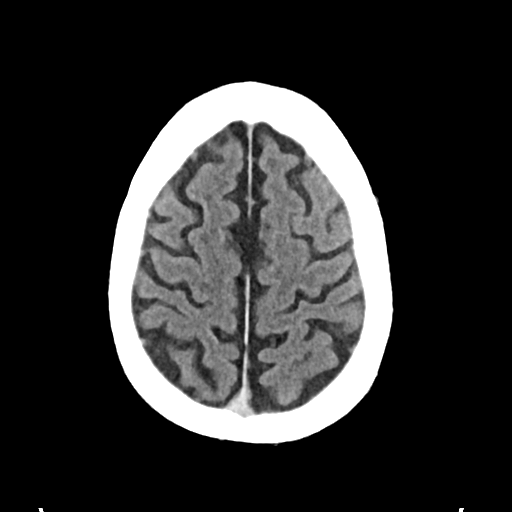
[im 28/32  brain]
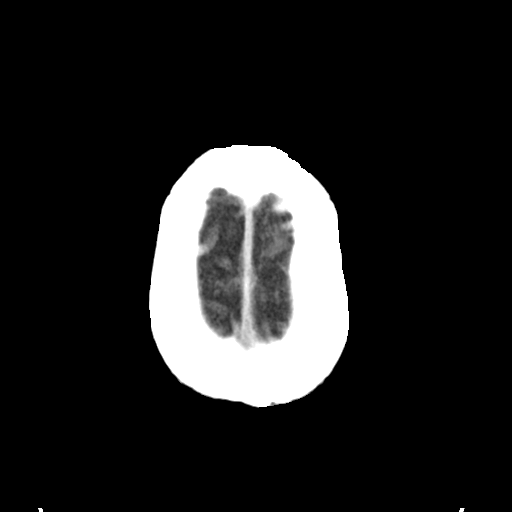

[Series 4: head bone · axial · 0.46mm/px · z∈[-85,-69]mm · 2 of 79 slices shown]
[im 8/79  bone]
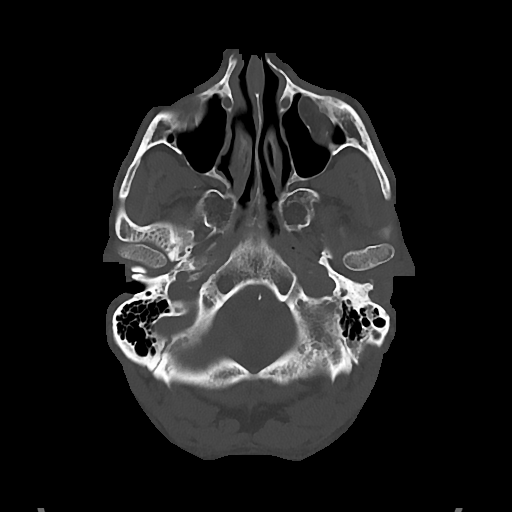
[im 16/79  bone]
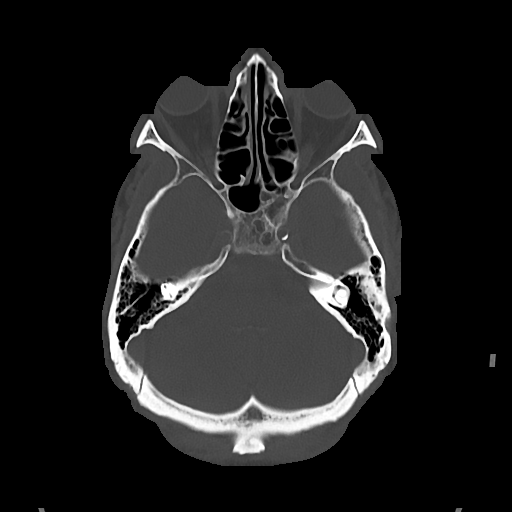

[Series 5: cor soft · coronal · 0.34mm/px · 3 of 68 slices shown]
[im 23/68  brain]
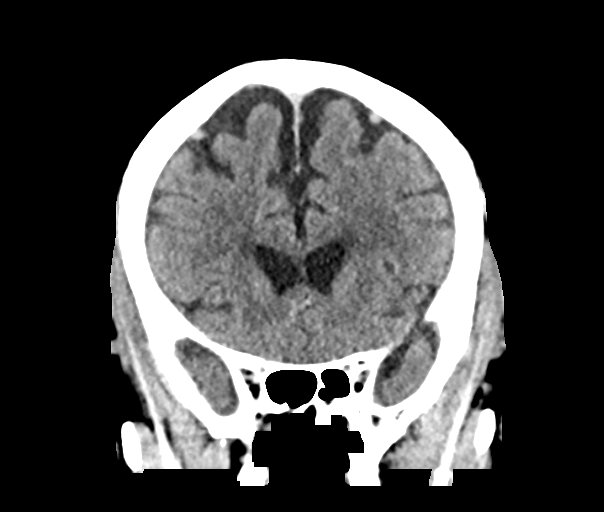
[im 30/68  brain]
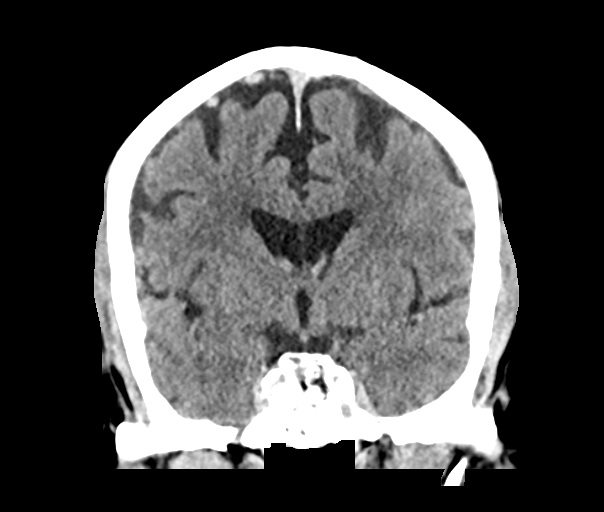
[im 38/68  brain]
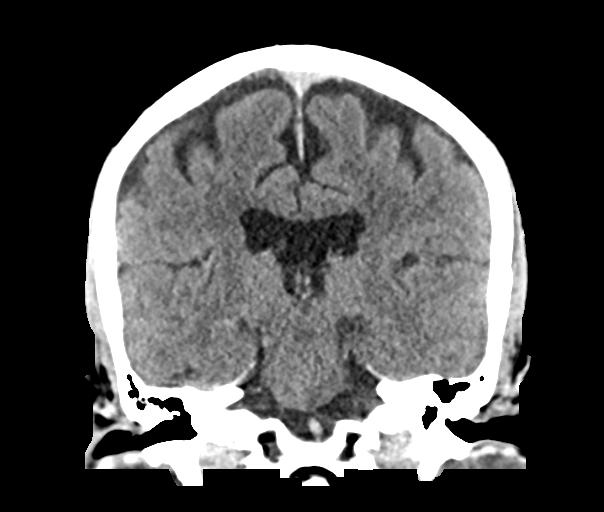

[Series 6: sag soft · sagittal · 0.30mm/px · 3 of 53 slices shown]
[im 18/53  brain]
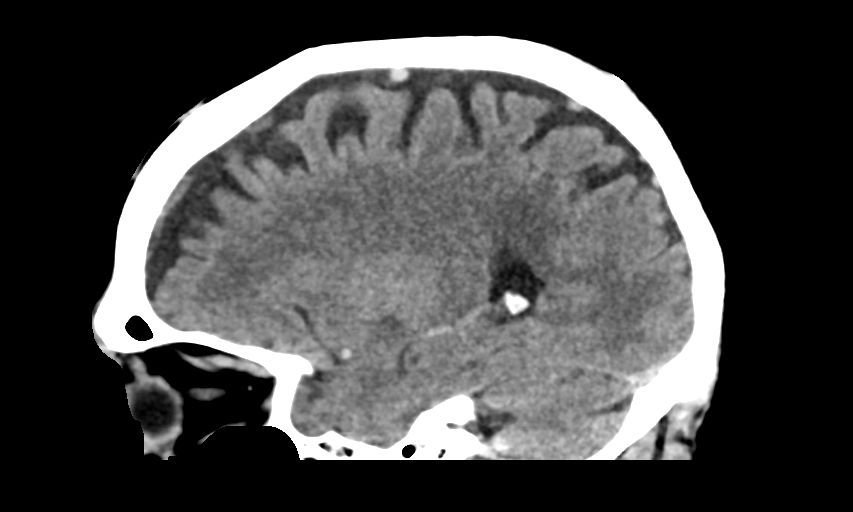
[im 27/53  brain]
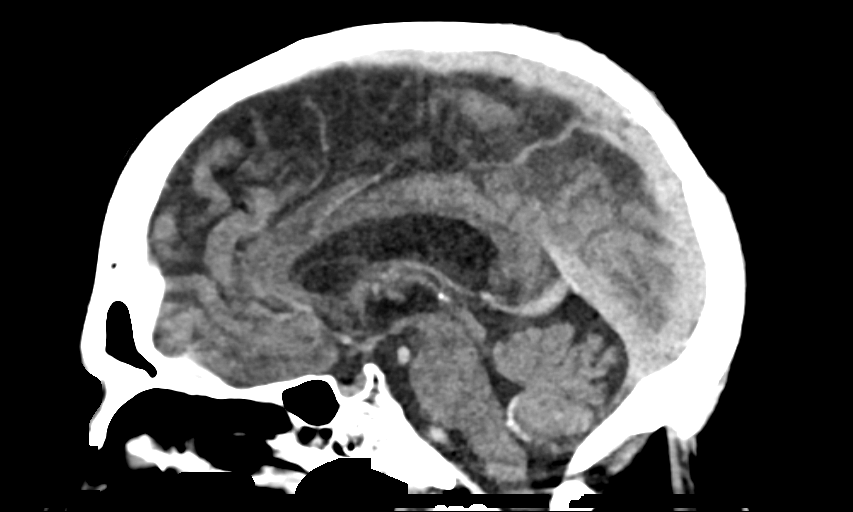
[im 35/53  brain]
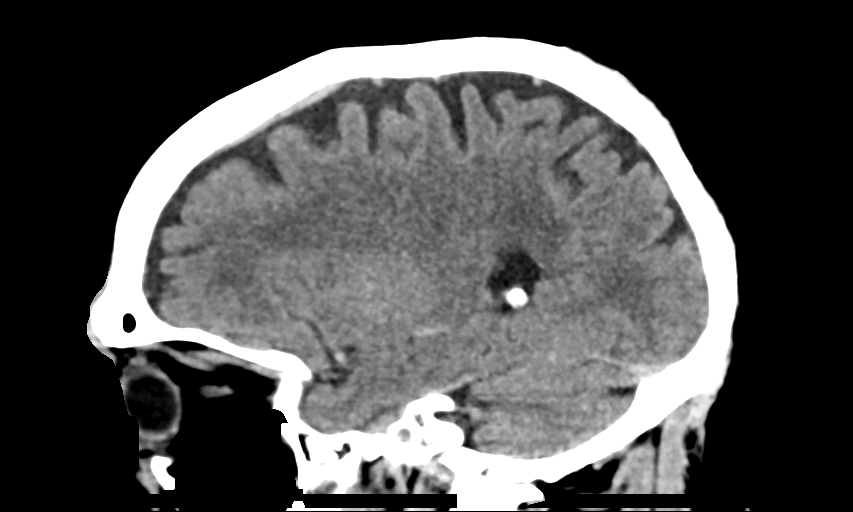

[15 of 47 positions shown; findings below may reference images not displayed]

FINDINGS: Brain: There is no evidence of acute intracranial hemorrhage, mass
lesion, brain edema or extra-axial fluid collection. The ventricles
and subarachnoid spaces are appropriately sized for age. There is no
CT evidence of acute cortical infarction. There is patchy
low-density in the periventricular white matter, likely due to
chronic small vessel ischemic changes.

Vascular: Intracranial vascular calcifications. No hyperdense vessel
identified.

Skull: Negative for fracture or focal lesion.

Sinuses/Orbits: There is chronic deformity of the lamina papyracea
on the left. The visualized paranasal sinuses, mastoid air cells and
middle ears are clear. Interval lens surgery in the right orbit.

Other: None.
IMPRESSION: 1. No acute intracranial findings.
2. Old facial fractures and previous right lens dislocation.
3. Mild chronic small vessel ischemic changes in the periventricular
white matter.

## 2018-03-31 IMAGING — DX DG CHEST 1V PORT
1 series · 1 of 1 positions shown · non-contrast
Comparison: [DATE] and [DATE] radiographs.  CT [DATE].

CLINICAL DATA: Shortness of breath.

EXAM:
PORTABLE CHEST 1 VIEW

[chest]
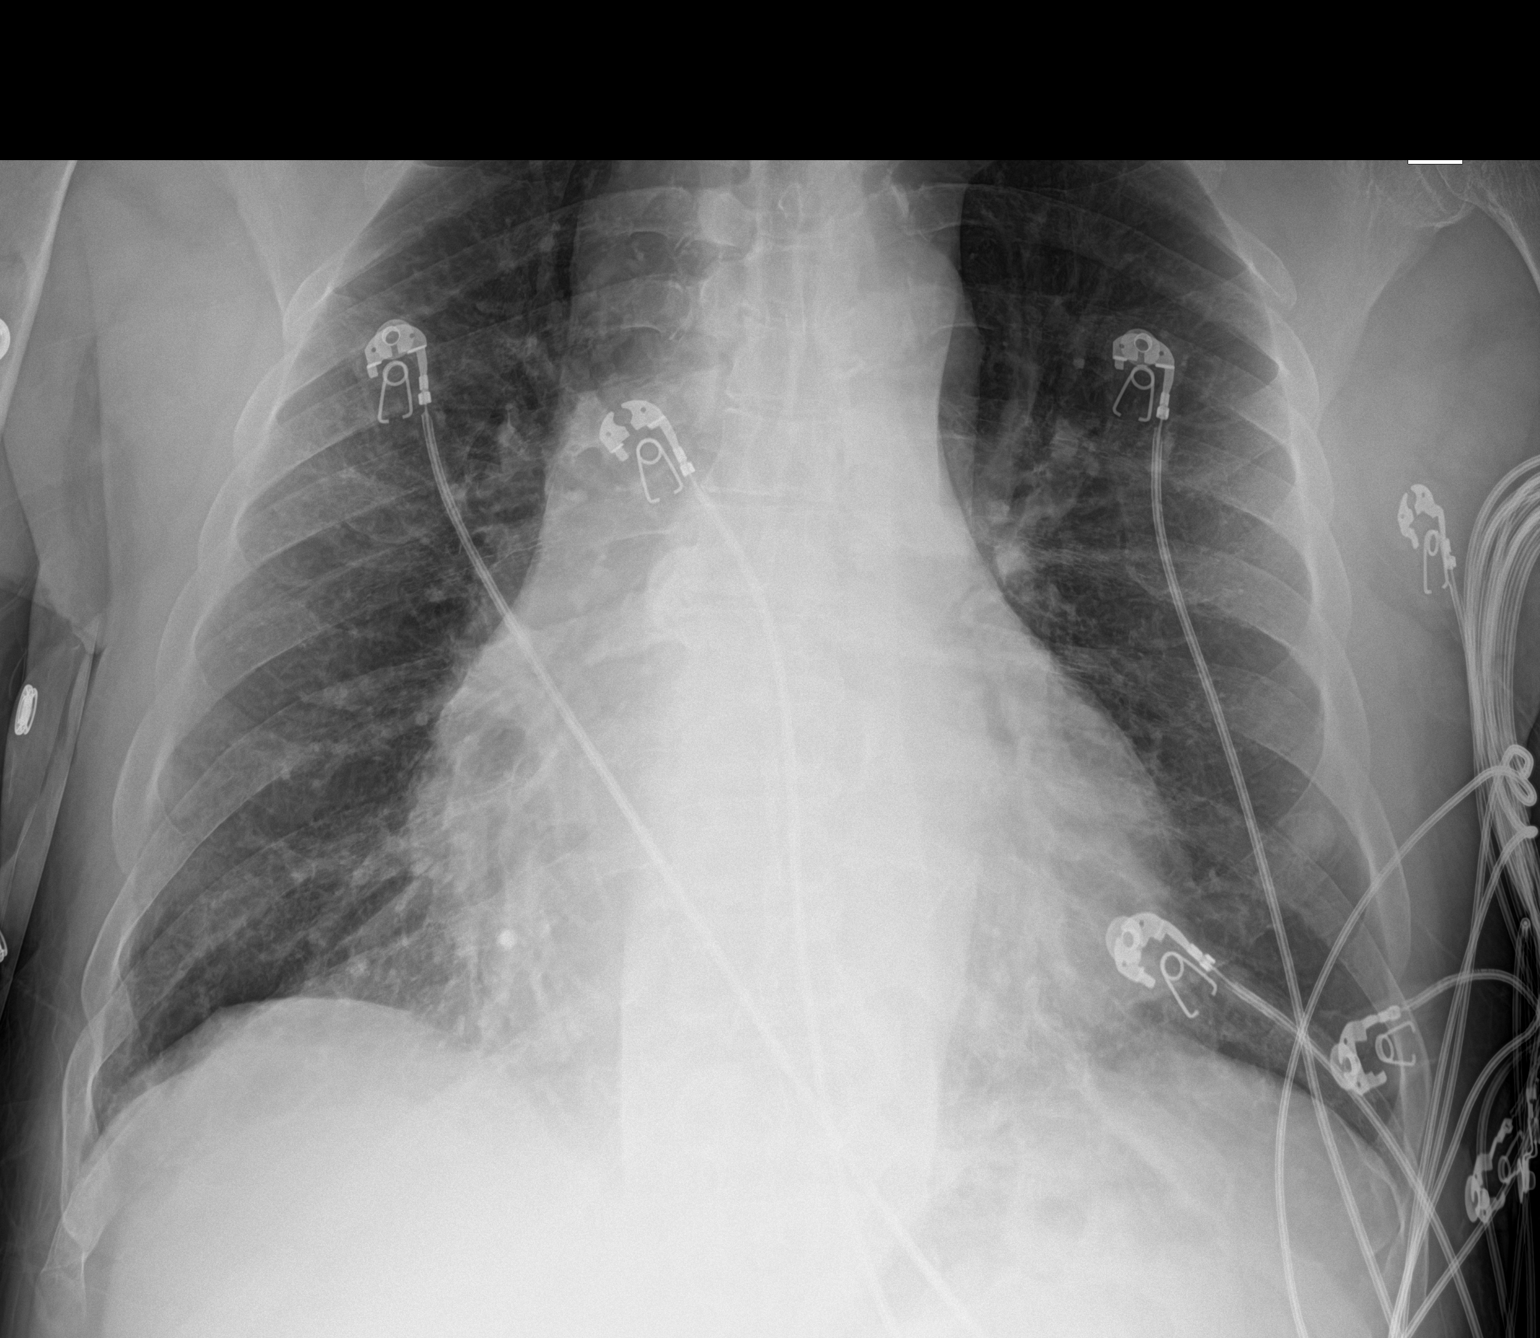

[1 of 1 positions shown; findings below may reference images not displayed]

FINDINGS: [TD] hours. There is stable cardiomegaly. The lungs are clear. There
is no pleural effusion or pneumothorax. There are stable prominent
nipple shadows bilaterally. No acute osseous findings are seen.
Telemetry leads overlie the chest.
IMPRESSION: Interval resolution of left lung opacities. Stable cardiomegaly. No
acute cardiopulmonary process.

## 2018-03-31 MED ORDER — SODIUM CHLORIDE 0.9 % IV BOLUS
250.0000 mL | Freq: Once | INTRAVENOUS | Status: AC
  Administered 2018-03-31: 250 mL via INTRAVENOUS

## 2018-03-31 MED ORDER — CALCITRIOL 0.5 MCG PO CAPS
ORAL_CAPSULE | ORAL | Status: AC
  Filled 2018-03-31: qty 1

## 2018-03-31 MED ORDER — HEPARIN SODIUM (PORCINE) 1000 UNIT/ML DIALYSIS
2000.0000 [IU] | Freq: Once | INTRAMUSCULAR | Status: AC
  Administered 2018-03-31: 2000 [IU] via INTRAVENOUS_CENTRAL
  Filled 2018-03-31: qty 2

## 2018-03-31 MED ORDER — ATORVASTATIN CALCIUM 40 MG PO TABS
40.0000 mg | ORAL_TABLET | Freq: Every day | ORAL | Status: DC
  Administered 2018-04-01 – 2018-04-03 (×4): 40 mg via ORAL
  Filled 2018-03-31 (×5): qty 1

## 2018-03-31 MED ORDER — METRONIDAZOLE IN NACL 5-0.79 MG/ML-% IV SOLN
500.0000 mg | Freq: Three times a day (TID) | INTRAVENOUS | Status: DC
  Administered 2018-04-01 – 2018-04-02 (×5): 500 mg via INTRAVENOUS
  Filled 2018-03-31 (×5): qty 100

## 2018-03-31 MED ORDER — ACETAMINOPHEN 650 MG RE SUPP: 650.0000 mg | Freq: Four times a day (QID) | RECTAL | Status: DC | PRN

## 2018-03-31 MED ORDER — SODIUM CHLORIDE 0.9 % IV SOLN: 100.0000 mL | INTRAVENOUS | Status: DC | PRN

## 2018-03-31 MED ORDER — VANCOMYCIN HCL 10 G IV SOLR
1500.0000 mg | INTRAVENOUS | Status: AC
  Administered 2018-03-31: 1500 mg via INTRAVENOUS
  Filled 2018-03-31: qty 1500

## 2018-03-31 MED ORDER — SODIUM CHLORIDE 0.9 % IV BOLUS: 250.0000 mL | Freq: Once | INTRAVENOUS | Status: DC

## 2018-03-31 MED ORDER — LACTULOSE 10 GM/15ML PO SOLN
20.0000 g | Freq: Two times a day (BID) | ORAL | Status: DC
  Administered 2018-04-01 – 2018-04-03 (×6): 20 g via ORAL
  Filled 2018-03-31 (×8): qty 30

## 2018-03-31 MED ORDER — PENTAFLUOROPROP-TETRAFLUOROETH EX AERO
1.0000 "application " | INHALATION_SPRAY | CUTANEOUS | Status: DC | PRN
  Filled 2018-03-31: qty 30

## 2018-03-31 MED ORDER — CINACALCET HCL 30 MG PO TABS
180.0000 mg | ORAL_TABLET | ORAL | Status: DC
  Administered 2018-04-02: 180 mg via ORAL
  Filled 2018-03-31: qty 6

## 2018-03-31 MED ORDER — SODIUM CHLORIDE 0.9 % IV SOLN
2.0000 g | INTRAVENOUS | Status: AC
  Administered 2018-03-31: 2 g via INTRAVENOUS
  Filled 2018-03-31: qty 2

## 2018-03-31 MED ORDER — ONDANSETRON HCL 4 MG/2ML IJ SOLN: 4.0000 mg | Freq: Four times a day (QID) | INTRAMUSCULAR | Status: DC | PRN

## 2018-03-31 MED ORDER — HEPARIN SODIUM (PORCINE) 1000 UNIT/ML IJ SOLN
INTRAMUSCULAR | Status: AC
  Filled 2018-03-31: qty 2

## 2018-03-31 MED ORDER — SODIUM CHLORIDE 0.9 % IV SOLN
1.0000 g | Freq: Once | INTRAVENOUS | Status: AC
  Administered 2018-03-31: 1 g via INTRAVENOUS
  Filled 2018-03-31: qty 10

## 2018-03-31 MED ORDER — SODIUM CHLORIDE 0.9 % IV SOLN
2.0000 g | INTRAVENOUS | Status: DC
  Administered 2018-04-01: 2 g via INTRAVENOUS
  Filled 2018-03-31 (×3): qty 2

## 2018-03-31 MED ORDER — SODIUM CHLORIDE 0.9% FLUSH
3.0000 mL | Freq: Two times a day (BID) | INTRAVENOUS | Status: DC
  Administered 2018-04-01 – 2018-04-03 (×6): 3 mL via INTRAVENOUS

## 2018-03-31 MED ORDER — AMLODIPINE BESYLATE 5 MG PO TABS
5.0000 mg | ORAL_TABLET | Freq: Every day | ORAL | Status: DC
  Administered 2018-04-01 – 2018-04-03 (×4): 5 mg via ORAL
  Filled 2018-03-31 (×5): qty 1

## 2018-03-31 MED ORDER — HEPARIN SODIUM (PORCINE) 5000 UNIT/ML IJ SOLN
5000.0000 [IU] | Freq: Three times a day (TID) | INTRAMUSCULAR | Status: DC
  Administered 2018-04-01 – 2018-04-03 (×8): 5000 [IU] via SUBCUTANEOUS
  Filled 2018-03-31 (×8): qty 1

## 2018-03-31 MED ORDER — SODIUM CHLORIDE 0.9 % IV SOLN
2.0000 g | Freq: Once | INTRAVENOUS | Status: AC
  Administered 2018-04-01: 2 g via INTRAVENOUS
  Filled 2018-03-31: qty 2

## 2018-03-31 MED ORDER — VANCOMYCIN HCL IN DEXTROSE 1-5 GM/200ML-% IV SOLN: 1000.0000 mg | Freq: Once | INTRAVENOUS | Status: DC

## 2018-03-31 MED ORDER — METOPROLOL SUCCINATE ER 50 MG PO TB24
50.0000 mg | ORAL_TABLET | Freq: Every day | ORAL | Status: DC
  Administered 2018-04-01: 50 mg via ORAL
  Filled 2018-03-31: qty 1

## 2018-03-31 MED ORDER — ONDANSETRON HCL 4 MG PO TABS: 4.0000 mg | ORAL_TABLET | Freq: Four times a day (QID) | ORAL | Status: DC | PRN

## 2018-03-31 MED ORDER — DEXTROSE 50 % IV SOLN
INTRAVENOUS | Status: AC
  Administered 2018-03-31: 09:00:00
  Filled 2018-03-31: qty 50

## 2018-03-31 MED ORDER — CALCITRIOL 0.25 MCG PO CAPS
0.7500 ug | ORAL_CAPSULE | ORAL | Status: DC
  Administered 2018-04-02: 0.75 ug via ORAL
  Filled 2018-03-31 (×3): qty 3
  Filled 2018-03-31: qty 1

## 2018-03-31 MED ORDER — CALCITRIOL 0.25 MCG PO CAPS
ORAL_CAPSULE | ORAL | Status: AC
  Filled 2018-03-31: qty 1

## 2018-03-31 MED ORDER — VANCOMYCIN HCL IN DEXTROSE 750-5 MG/150ML-% IV SOLN
750.0000 mg | INTRAVENOUS | Status: DC
  Filled 2018-03-31 (×2): qty 150

## 2018-03-31 MED ORDER — DEXTROSE 50 % IV SOLN
1.0000 | Freq: Once | INTRAVENOUS | Status: AC
  Administered 2018-03-31: 50 mL via INTRAVENOUS
  Filled 2018-03-31: qty 50

## 2018-03-31 MED ORDER — VANCOMYCIN HCL IN DEXTROSE 750-5 MG/150ML-% IV SOLN
INTRAVENOUS | Status: AC
  Filled 2018-03-31: qty 150

## 2018-03-31 MED ORDER — ALBUTEROL SULFATE (2.5 MG/3ML) 0.083% IN NEBU
5.0000 mg | INHALATION_SOLUTION | Freq: Once | RESPIRATORY_TRACT | Status: AC
  Administered 2018-03-31: 5 mg via RESPIRATORY_TRACT
  Filled 2018-03-31: qty 6

## 2018-03-31 MED ORDER — ACETAMINOPHEN 325 MG PO TABS: 650.0000 mg | ORAL_TABLET | Freq: Four times a day (QID) | ORAL | Status: DC | PRN

## 2018-03-31 MED ORDER — LIDOCAINE-PRILOCAINE 2.5-2.5 % EX CREA
1.0000 "application " | TOPICAL_CREAM | CUTANEOUS | Status: DC | PRN
  Filled 2018-03-31: qty 5

## 2018-03-31 MED ORDER — LIDOCAINE HCL (PF) 1 % IJ SOLN
5.0000 mL | INTRAMUSCULAR | Status: DC | PRN
  Filled 2018-03-31: qty 5

## 2018-03-31 MED ORDER — VANCOMYCIN HCL IN DEXTROSE 750-5 MG/150ML-% IV SOLN
750.0000 mg | Freq: Once | INTRAVENOUS | Status: AC
  Administered 2018-04-01: 750 mg via INTRAVENOUS
  Filled 2018-03-31: qty 150

## 2018-03-31 MED ORDER — INSULIN ASPART 100 UNIT/ML ~~LOC~~ SOLN
5.0000 [IU] | Freq: Once | SUBCUTANEOUS | Status: AC
  Administered 2018-03-31: 5 [IU] via INTRAVENOUS
  Filled 2018-03-31: qty 1

## 2018-03-31 MED ORDER — SODIUM CHLORIDE 0.9 % IV SOLN: 2.0000 g | Freq: Once | INTRAVENOUS | Status: DC

## 2018-03-31 MED ORDER — METRONIDAZOLE IN NACL 5-0.79 MG/ML-% IV SOLN
500.0000 mg | Freq: Three times a day (TID) | INTRAVENOUS | Status: DC
  Administered 2018-03-31: 500 mg via INTRAVENOUS
  Filled 2018-03-31: qty 100

## 2018-03-31 MED ORDER — CHLORHEXIDINE GLUCONATE CLOTH 2 % EX PADS
6.0000 | MEDICATED_PAD | Freq: Every day | CUTANEOUS | Status: DC
  Administered 2018-04-01 – 2018-04-03 (×3): 6 via TOPICAL

## 2018-03-31 MED ORDER — ALBUTEROL SULFATE (2.5 MG/3ML) 0.083% IN NEBU: 2.5000 mg | INHALATION_SOLUTION | RESPIRATORY_TRACT | Status: DC | PRN

## 2018-03-31 MED ORDER — ASPIRIN EC 81 MG PO TBEC
81.0000 mg | DELAYED_RELEASE_TABLET | Freq: Every day | ORAL | Status: DC
  Administered 2018-04-01 – 2018-04-03 (×3): 81 mg via ORAL
  Filled 2018-03-31 (×4): qty 1

## 2018-03-31 NOTE — Progress Notes (Signed)
Pharmacy Antibiotic Note  Raymond [name] is a 62 y.o. male admitted on 03/31/2018 with sepsis.  Pharmacy has been consulted for Vanc/Cefepime dosing.  CC/HPI: Weakness, CBG 40,+alcohol, missed HD Fri 9/20. Possibly cocaine. Code sepsis - Just had cath 9/19   Plan: Vanco 1500mg  x 1 then 750mg  IV q HD Cefepime 2g IV x 1 then 2g q HD F/u for emergent HD  Height: 5\' 8"  (172.7 cm) Weight: 162 lb (73.5 kg) IBW/kg (Calculated) : 68.4  No data recorded.  Recent Labs  Lab 03/27/18 1420 03/29/18 0802 03/31/18 0917 03/31/18 0918  WBC 3.5  --   --   --   CREATININE 13.23* 9.77*  --  15.10*  LATICACIDVEN  --   --  4.16*  --     Estimated Creatinine Clearance: 4.8 mL/min (A) (by C-G formula based on SCr of 15.1 mg/dL (H)).    No Known Allergies   Emory Leaver S. Alford Highland, PharmD, [place] Clinical Staff Pharmacist [phone]  Eilene Ghazi [facility] 03/31/2018 9:38 AM

## 2018-03-31 NOTE — ED Notes (Signed)
Pt states that he is urinating blood. RN notified.

## 2018-03-31 NOTE — Progress Notes (Signed)
Pharmacy Antibiotic Note  Raymond [name] is a 62 y.o. male admitted on 03/31/2018 with sepsis.  Pharmacy has been consulted for Vanc/Cefepime dosing.  CC/HPI: Weakness, CBG 40,+alcohol, missed HD Fri 9/20. Possibly cocaine. Code sepsis - Just had cath 9/19   Plan: Vanco 1500mg  x 1 then 750mg  IV q HD Cefepime 2g IV x 1 then 2g q HD F/u for emergent HD  ADDENDUM:  Pt went to HD off schedule today due to missed HD 9/20, tolerated full session.  Plan: Vancomycin 750mg  IV x 1 post-HD tonight. Then continue vancomycin 750mg  IV qHD MWF Cefepime 2g IV x 1; then continue cefepime 2g IV qMWF at 1800 Monitor clinical progress, c/s, renal function F/u de-escalation plan/LOT, vancomycin trough as indicated   Height: 5\' 8"  (172.7 cm) Weight: 158 lb 11.7 oz (72 kg) IBW/kg (Calculated) : 68.4  Temp (24hrs), Avg:97 F (36.1 C), Min:94.8 F (34.9 C), Max:97.8 F (36.6 C)  Recent Labs  Lab 03/27/18 1420 03/29/18 0802 03/31/18 0907 03/31/18 0917 03/31/18 0918 03/31/18 1050 03/31/18 1120 03/31/18 1604  WBC 3.5  --  2.6*  --   --   --   --  3.3*  CREATININE 13.23* 9.77*  --   --  15.10* 13.90*  --   --   LATICACIDVEN  --   --   --  4.16*  --   --  3.83*  --     Estimated Creatinine Clearance: 5.2 mL/min (A) (by C-G formula based on SCr of 13.9 mg/dL (H)).    No Known Allergies  Elicia Lamp, PharmD, BCPS Please check AMION for all [place] contact numbers Clinical Pharmacist 03/31/2018 9:51 PM

## 2018-03-31 NOTE — ED Notes (Signed)
50 dextrose given IV

## 2018-03-31 NOTE — H&P (Signed)
History and Physical   Raymond [name] IRC:[id number] DOB: 07/06/1956 DOA: 03/31/2018  Referring MD/NP/PA: Dr. Ellender Hose, [place] PCP: Scot Jun, FNP  Patient coming from: Home  Chief Complaint: Weakness  HPI: Raymond [name] is a 62 y.o. male with a history of ESRD, PVD, polysubstance use, HTN, and treated hepatitis C who presented to the ED with general weakness. He reported feeling diffusely weak since the previous evening, worsening and constant, feeling hot and cold, but had no localizing symptoms. He was out of it this morning so his girlfriend called EMS suspecting he was drunk as he appeared confused and had skipped dialysis yesterday and drunk beer the previous evening. He was hypoglycemic in the 20's on EMS arrival, given D50 and reported feeling jittery on arrival to the ED. He was found to be significantly hypothermic with lactic acidosis for which code sepsis was called, delivering broad spectrum antibiotics. Also had hyperkalemia with peaked T waves and severe HTN. Temporizing measures taken and nephrology plans urgent hemodialysis. He is nearly anuric but urinated while still in the ED, reporting gross hematuria for which CT abd/pelvis was performed. No nephrolithiasis was identified, though did have mild bladder wall thickening.   Review of Systems: Denies shortness of breath, cough, leg swelling, orthopnea, palpitations, abdominal pain, N/V/D, rash, myalgias, and per HPI. All others reviewed and are negative.   Past Medical History:  Diagnosis Date  . Anemia   . COPD (chronic obstructive pulmonary disease) ([place])   . Dyspnea    "with too much fluid"  . ESRD (end stage renal disease) on dialysis [facility])    "MWF; [facility]" (07/22/17)  . Hemodialysis patient ([place])   . Hepatitis C    Still positive s/p liver biopsy at [facility]  and interferon therapy for 6 months. Most recent lab work was on 10/24/12  . Hepatitis C    "took the tx; gone now" (12/05/2016)  Was treated  . History of  blood transfusion ~ 2012/2013   "related to my kidneys; blood was low"  . Hypertension   . Substance abuse ([place])   . Thyroid disease    Past Surgical History:  Procedure Laterality Date  . ABDOMINAL AORTOGRAM W/LOWER EXTREMITY N/A 02/08/2018   Procedure: ABDOMINAL AORTOGRAM W/LOWER EXTREMITY;  Surgeon: Conrad , MD;  Location: [place] CV LAB;  Service: Cardiovascular;  Laterality: N/A;  . AV FISTULA PLACEMENT Left Aug. 2013 ?  [name] LEFT HEART CATH AND CORONARY ANGIOGRAPHY N/A 03/29/2018   Procedure: LEFT HEART CATH AND CORONARY ANGIOGRAPHY;  Surgeon: Nelva Bush, MD;  Location: [place] CV LAB;  Service: Cardiovascular;  Laterality: N/A;  . LIVER BIOPSY    . ORIF ULNAR FRACTURE Left 05/18/2017   Procedure: OPEN REDUCTION INTERNAL FIXATION (ORIF) ULNAR FRACTURE;  Surgeon: Iran Planas, MD;  Location: [place];  Service: Orthopedics;  Laterality: Left;  . SHUNTOGRAM N/A 12/11/2012   Procedure: Earney Mallet;  Surgeon: Serafina Mitchell, MD;  Location: [facility] C[facility] LAB;  Service: Cardiovascular;  Laterality: N/A;   - Smokes about 1 ppd, hasn't smoked cocaine in the last month, drank 40oz beer last night but denies drinking this morning. Lives in an apartment with his girlfriend.  No Known Allergies Family History  Problem Relation Age of Onset  . Diabetes Father   . Hypertension Father   . Heart disease Father    - Family history otherwise reviewed and not pertinent.  Prior to Admission medications   Medication Sig Start Date End Date Taking? Authorizing Provider  acetaminophen (TYLENOL) 325 MG tablet Take 2 tablets (650 mg total) by mouth every 6 (six) hours as needed. Patient taking differently: Take 650 mg by mouth every 6 (six) hours as needed for mild pain.  07/22/17  Yes Varney Biles, MD  albuterol (PROVENTIL HFA;VENTOLIN HFA) 108 (90 Base) MCG/ACT inhaler Inhale 2 puffs into the lungs every 6 (six) hours as needed for wheezing or shortness of breath. 12/06/16  Yes Lorella Nimrod, MD    amLODipine (NORVASC) 5 MG tablet Take 1 tablet (5 mg total) by mouth daily. 09/12/17  Yes Scot Jun, FNP  aspirin EC 81 MG tablet Take 81 mg by mouth daily.   Yes [provider]  atorvastatin (LIPITOR) 40 MG tablet Take 1 tablet (40 mg total) by mouth daily. 03/27/18 06/25/18 Yes Isaiah Serge, NP  cinacalcet (SENSIPAR) 30 MG tablet Take 30 mg by mouth daily.  10/12/15  Yes [provider]  gabapentin (NEURONTIN) 300 MG capsule TAKE 1 CAPSULE BY MOUTH AT BEDTIME AS NEEDED FOR SLEEP Patient taking differently: Take 300 mg by mouth at bedtime as needed (sleep).  09/12/17  Yes Scot Jun, FNP  metoprolol succinate (TOPROL-XL) 25 MG 24 hr tablet Take 2 tablets (50 mg total) by mouth daily. 07/13/17  Yes Scot Jun, FNP  sevelamer (RENAGEL) 800 MG tablet Take 4,800 mg by mouth 3 (three) times daily with meals.    Yes [provider]    Physical Exam: Vitals:   03/31/18 1345 03/31/18 1400 03/31/18 1415 03/31/18 1430  BP: (!) 149/78 (!) 149/88 (!) 146/78 (!) 158/79  Pulse: 79 84 87 82  Resp: (!) 24 13 15 16   Temp:      TempSrc:      SpO2: 96% 96% 95% 97%  Weight:      Height:       Constitutional: Chronically ill-appearing male in no distress, calm demeanor Eyes: Lids normal, PERRL with IOL/dislocation on right, mild conjunctival injection on right. ENMT: Mucous membranes are dry. Posterior pharynx clear of any exudate or lesions. Poor dentition.  Neck: normal, supple, no masses, no thyromegaly Respiratory: Non-labored breathing room air without accessory muscle use. Clear breath sounds to auscultation bilaterally Cardiovascular: Regular rate and rhythm, no murmurs, rubs, or gallops. No carotid bruits. No JVD. Trace L > R LE edema. Very diminished pedal pulses. Left forearm AVF +thrill, no erythema or discharge. Abdomen: Normoactive bowel sounds. No tenderness, non-distended, and no masses palpated. No hepatosplenomegaly. GU: No indwelling  catheter. Penis appears normal. Musculoskeletal: No clubbing / cyanosis. No joint deformity upper and lower extremities. Good ROM, no contractures. Normal muscle tone.  Skin: Warm, dry. No rashes, wounds, or ulcers. No significant lesions noted.  Neurologic: CN II-XII grossly intact. Gait not assessed. Speech normal. No focal deficits in motor strength or sensation in all extremities.  Psychiatric: Alert and oriented x3. Poor judgment and insight. Mood euthymic with congruent affect.   Labs on Admission: I have personally reviewed following labs and imaging studies  CBC: Recent Labs  Lab 03/27/18 1420 03/31/18 0907 03/31/18 0918  WBC 3.5 2.6*  --   NEUTROABS 2.0 2.1  --   HGB 11.0* 11.3* 12.6*  HCT 32.7* 37.2* 37.0*  MCV 101* 110.4*  --   PLT 174 124*  --    Basic Metabolic Panel: Recent Labs  Lab 03/27/18 1420 03/29/18 0802 03/31/18 0918 03/31/18 1050  NA 144 141 132* 134*  K 6.3* 5.2* 7.0* 7.1*  CL 96 97* 101 96*  CO2 25 29  --  18*  GLUCOSE 81 87 155* 151*  BUN 57* 32* 75* 63*  CREATININE 13.23* 9.77* 15.10* 13.90*  CALCIUM 7.8* 7.6*  --  7.8*  MG  --   --   --  2.5*   GFR: Estimated Creatinine Clearance: 5.2 mL/min (A) (by C-G formula based on SCr of 13.9 mg/dL (H)). Liver Function Tests: Recent Labs  Lab 03/31/18 1050  AST 25  ALT 10  ALKPHOS 56  BILITOT 0.7  PROT 7.3  ALBUMIN 3.4*   No results for input(s): LIPASE, AMYLASE in the last 168 hours. Recent Labs  Lab 03/31/18 1146  AMMONIA 74*   Coagulation Profile: Recent Labs  Lab 03/31/18 0942  INR 1.09   Cardiac Enzymes: Recent Labs  Lab 03/31/18 0907  TROPONINI <0.03   BNP (last 3 results) No results for input(s): PROBNP in the last 8760 hours. HbA1C: No results for input(s): HGBA1C in the last 72 hours. CBG: Recent Labs  Lab 03/31/18 0843 03/31/18 0905 03/31/18 1118  GLUCAP 21* 146* 70   Lipid Profile: No results for input(s): CHOL, HDL, LDLCALC, TRIG, CHOLHDL, LDLDIRECT in the  last 72 hours. Thyroid Function Tests: No results for input(s): TSH, T4TOTAL, FREET4, T3FREE, THYROIDAB in the last 72 hours. Anemia Panel: No results for input(s): VITAMINB12, FOLATE, FERRITIN, TIBC, IRON, RETICCTPCT in the last 72 hours. Urine analysis:    Component Value Date/Time   COLORURINE YELLOW 05/08/2010 [place] 05/08/2010 1335   LABSPEC 1.009 05/08/2010 1335   PHURINE 6.0 05/08/2010 1335   GLUCOSEU NEGATIVE 05/08/2010 1335   HGBUR NEGATIVE 05/08/2010 1335   BILIRUBINUR NEGATIVE 05/08/2010 1335   KETONESUR NEGATIVE 05/08/2010 1335   PROTEINUR 100 (A) 05/08/2010 1335   UROBILINOGEN 0.2 05/08/2010 1335   NITRITE NEGATIVE 05/08/2010 1335   LEUKOCYTESUR SMALL (A) 05/08/2010 1335    No results found for this or any previous visit (from the past 240 hour(s)).   Radiological Exams on Admission: Ct Abdomen Pelvis Wo Contrast  Result Date: 03/31/2018 CLINICAL DATA:  Generalized weakness, nausea, vomiting, hematuria EXAM: CT ABDOMEN AND PELVIS WITHOUT CONTRAST TECHNIQUE: Multidetector CT imaging of the abdomen and pelvis was performed following the standard protocol without IV contrast. COMPARISON:  None. FINDINGS: Lower chest: Minor basilar atelectasis. Cardiomegaly without pericardial or pleural effusion. Descending thoracic aorta is atherosclerotic and mildly ectatic. Degenerative changes of the spine with osteophytes. Hepatobiliary: No focal hepatic abnormality or ductal dilatation within the limits of noncontrast imaging. Hyper attenuation in the gallbladder compatible with vicarious contrast excretion. Biliary system is nondilated. Pancreas: No significant focal abnormality by noncontrast imaging. No surrounding inflammatory process. Spleen: Normal in size without focal abnormality. Adrenals/Urinary Tract: Normal adrenal glands. Atrophic kidneys with numerous hypodense renal cysts compatible with end-stage renal disease. No renal obstruction or hydronephrosis. No  hydroureter. Bladder is collapsed with mild wall thickening. Stomach/Bowel: Negative for bowel obstruction, significant dilatation, ileus, or free air. Scattered colonic diverticulosis. Normal appearing appendix containing air. No acute inflammatory process, fluid collection, or abscess. No ascites. Vascular/Lymphatic: Atherosclerosis of aorta. Minor ectasia. No significant aneurysm. No retroperitoneal hemorrhage or hematoma. No adenopathy. Reproductive: Prostate gland unremarkable. Seminal vesicles are symmetric. High positioned right testicle as before. Other: No abdominal wall hernia or abnormality. No abdominopelvic ascites. Musculoskeletal: Degenerative changes noted. No acute osseous finding. IMPRESSION: No acute intra-abdominal or pelvic finding by noncontrast CT. Chronic renal atrophy and renal cysts related to end-stage renal disease. Vicarious contrast excretion in the gallbladder noted from recent contrast  for coronary catheterization. Atherosclerosis as above Electronically Signed   By: Jerilynn Mages.  Shick M.D.   On: 03/31/2018 14:30   Ct Head Wo Contrast  Result Date: 03/31/2018 CLINICAL DATA:  Generalized weakness for a couple days. Hematuria. Unequal pupils. EXAM: CT HEAD WITHOUT CONTRAST TECHNIQUE: Contiguous axial images were obtained from the base of the skull through the vertex without intravenous contrast. COMPARISON:  Maxillofacial CT 07/22/2017.  Head CT 01/30/2008. FINDINGS: Brain: There is no evidence of acute intracranial hemorrhage, mass lesion, brain edema or extra-axial fluid collection. The ventricles and subarachnoid spaces are appropriately sized for age. There is no CT evidence of acute cortical infarction. There is patchy low-density in the periventricular white matter, likely due to chronic small vessel ischemic changes. Vascular: Intracranial vascular calcifications. No hyperdense vessel identified. Skull: Negative for fracture or focal lesion. Sinuses/Orbits: There is chronic deformity  of the lamina papyracea on the left. The visualized paranasal sinuses, mastoid air cells and middle ears are clear. Interval lens surgery in the right orbit. Other: None. IMPRESSION: 1. No acute intracranial findings. 2. Old facial fractures and previous right lens dislocation. 3. Mild chronic small vessel ischemic changes in the periventricular white matter. Electronically Signed   By: Richardean Sale M.D.   On: 03/31/2018 14:26   Dg Chest Portable 1 View  Result Date: 03/31/2018 CLINICAL DATA:  Shortness of breath. EXAM: PORTABLE CHEST 1 VIEW COMPARISON:  08/07/2017 and 06/20/2017 radiographs.  CT 06/12/2017. FINDINGS: 0902 hours. There is stable cardiomegaly. The lungs are clear. There is no pleural effusion or pneumothorax. There are stable prominent nipple shadows bilaterally. No acute osseous findings are seen. Telemetry leads overlie the chest. IMPRESSION: Interval resolution of left lung opacities. Stable cardiomegaly. No acute cardiopulmonary process. Electronically Signed   By: Richardean Sale M.D.   On: 03/31/2018 09:22   EKG: Independently reviewed. Peaked T waves.  Assessment/Plan Principal Problem:   Sepsis ([place]) Active Problems:   HYPERCHOLESTEROLEMIA   HYPERTENSION, BENIGN SYSTEMIC   Atherosclerosis of native arteries of extremity with intermittent claudication (HCC)   End stage renal disease (HCC)   Hyperkalemia   Hypoglycemia   Polysubstance abuse (HCC)   Sepsis possibly due to urinary source: Bladder wall thickening on CT, new hematuria (though ?contrast excretion from recent cath) - Continue broad abx pending culture data - Check and trend PCT - Urinalysis and culture pending. - Admit to SDU, monitor cultures  Leukopenia: This seems to be somewhat chronic, though likely worsened due to sepsis.  - Monitor.  ESRD with hyperkalemia and acidosis: Missed HD 9/20 and incomplete dialysis chronically. Given calcium gluconate, D50 and insulin. Fortunately no pulmonary edema on  CXR or hypoxia on exam. - Urgent HD per nephrology  HTN with hypertensive urgency: Due to excess volume.  - HD as above.  - Continue home medications: Norvasc, metoprolol  Hypoglycemia: Possibly related to sepsis, possibly impaired gluconeogenesis. Could consider cortisol testing.  - Continue monitoring CBG, continue dextrose prn - If continues, would consider further work up.   PVD: Planning left fem-pop bypass 10/2 with Dr. Donnetta Hutching.  - Continue ASA  Weakness: Thought to be due to sepsis and metabolic derangements. CT head with no acute findings. Hyperammonemia noted.  - Monitor. PT/OT when able.  - Trial lactulose  Anemia of chronic disease:  - Continue ESA per nephrology, will recheck CBC in AM  Mild, nonobstructive CAD: With normal LV systolic function on LHC 0/81. - Continue ASA, beta blocker, statin for risk reduction.  Polysubstance abuse, tobacco use:  -  UDS ordered, hopefully can run off urine from ED. Will add culture.  - Nicotine patch ordered. Pt not contemplative regarding not smoking. Denies cocaine use.  Lactic acidosis: Worsened by renal failure, improved thus far.   DVT prophylaxis: Heparin  Code Status: Full  Family Communication: None at bedside Disposition Plan: Home pending further work up [employer] called: Nephrology, Dr. Jonnie Finner  Admission status: Inpatient    Patrecia Pour, MD [facility] [email or web address] Password [email or web address] 03/31/2018, 3:51 PM

## 2018-03-31 NOTE — ED Provider Notes (Signed)
[facility] [facility] EMERGENCY DEPARTMENT Provider Note   CSN: [id number] Arrival date & time: 03/31/18  0831     History   Chief Complaint Chief Complaint  Patient presents with  . Needs Dialysis  . Hypoglycemia  . Alcohol Intoxication    HPI Raymond [name] is a 62 y.o. male.  HPI 62 year old male with extensive past medical history including end-stage renal disease, COPD, here with altered mental status.  Patient reportedly just underwent a catheterization as an outpatient for evaluation of possible iliac stent which is planned for next month.  The patient reportedly skipped dialysis yesterday.  He admits to possible alcohol and drug use throughout the day.  He became very weak and confused and was found confused this morning by his roommate.  His initial blood sugar was in the 20s.  He was subsequently sent here for evaluation.  Patient received D50 prior to arrival.  Patient currently states he feels weak and "jittery" but denies any other complaints.  He does not feel necessarily short of breath.  Denies any chest pain.  Denies any abdominal pain, nausea, or vomiting.  He does not believe he had any recent fevers or illnesses.  Past Medical History:  Diagnosis Date  . Anemia   . COPD (chronic obstructive pulmonary disease) ([place])   . Dyspnea    "with too much fluid"  . ESRD (end stage renal disease) on dialysis [facility])    "MWF; Jeneen Rinks" (07/22/17)  . Hemodialysis patient ([place])   . Hepatitis C    Still positive s/p liver biopsy at [facility]  and interferon therapy for 6 months. Most recent lab work was on 10/24/12  . Hepatitis C    "took the tx; gone now" (12/05/2016)  Was treated  . History of blood transfusion ~ 2012/2013   "related to my kidneys; blood was low"  . Hypertension   . Substance abuse ([place])   . Thyroid disease     Patient Active Problem List   Diagnosis Date Noted  . Sepsis ([place]) 03/31/2018  . Preop cardiovascular exam 03/29/2018  . Volume  overload 08/07/2017  . Acute pulmonary edema (HCC)   . Dyspnea 06/20/2017  . Acute bronchitis   . COPD exacerbation ([place])   . End-stage renal disease on hemodialysis ([place])   . Hypoxia 12/05/2016  . Hyperkalemia 12/19/2015  . Hypoglycemia 12/19/2015  . Polysubstance abuse ([place]) 12/19/2015  . Homelessness 12/19/2015  . Anemia associated with stage 5 chronic renal failure ([place]) 12/19/2015  . Atypical chest pain 12/19/2013  . Atherosclerosis of native arteries of extremity with intermittent claudication ([place]) 12/04/2012  . End stage renal disease ([place]) 12/04/2012  . HEPATITIS C 09/07/2006  . HYPERCHOLESTEROLEMIA 09/07/2006  . HYPERTENSION, BENIGN SYSTEMIC 09/07/2006    Past Surgical History:  Procedure Laterality Date  . ABDOMINAL AORTOGRAM W/LOWER EXTREMITY N/A 02/08/2018   Procedure: ABDOMINAL AORTOGRAM W/LOWER EXTREMITY;  Surgeon: Conrad Amery, MD;  Location: [place] CV LAB;  Service: Cardiovascular;  Laterality: N/A;  . AV FISTULA PLACEMENT Left Aug. 2013 ?  [name] LEFT HEART CATH AND CORONARY ANGIOGRAPHY N/A 03/29/2018   Procedure: LEFT HEART CATH AND CORONARY ANGIOGRAPHY;  Surgeon: Nelva Bush, MD;  Location: [place] CV LAB;  Service: Cardiovascular;  Laterality: N/A;  . LIVER BIOPSY    . ORIF ULNAR FRACTURE Left 05/18/2017   Procedure: OPEN REDUCTION INTERNAL FIXATION (ORIF) ULNAR FRACTURE;  Surgeon: Iran Planas, MD;  Location: [place];  Service: Orthopedics;  Laterality: Left;  . SHUNTOGRAM N/A  12/11/2012   Procedure: Earney Mallet;  Surgeon: Serafina Mitchell, MD;  Location: [facility] CATH LAB;  Service: Cardiovascular;  Laterality: N/A;        Home Medications    Prior to Admission medications   Medication Sig Start Date End Date Taking? Authorizing Provider  acetaminophen (TYLENOL) 325 MG tablet Take 2 tablets (650 mg total) by mouth every 6 (six) hours as needed. Patient taking differently: Take 650 mg by mouth every 6 (six) hours as needed for mild pain.  07/22/17  Yes  Varney Biles, MD  albuterol (PROVENTIL HFA;VENTOLIN HFA) 108 (90 Base) MCG/ACT inhaler Inhale 2 puffs into the lungs every 6 (six) hours as needed for wheezing or shortness of breath. 12/06/16  Yes Lorella Nimrod, MD  amLODipine (NORVASC) 5 MG tablet Take 1 tablet (5 mg total) by mouth daily. 09/12/17  Yes Scot Jun, FNP  aspirin EC 81 MG tablet Take 81 mg by mouth daily.   Yes [provider]  atorvastatin (LIPITOR) 40 MG tablet Take 1 tablet (40 mg total) by mouth daily. 03/27/18 06/25/18 Yes Isaiah Serge, NP  cinacalcet (SENSIPAR) 30 MG tablet Take 30 mg by mouth daily.  10/12/15  Yes [provider]  gabapentin (NEURONTIN) 300 MG capsule TAKE 1 CAPSULE BY MOUTH AT BEDTIME AS NEEDED FOR SLEEP Patient taking differently: Take 300 mg by mouth at bedtime as needed (sleep).  09/12/17  Yes Scot Jun, FNP  metoprolol succinate (TOPROL-XL) 25 MG 24 hr tablet Take 2 tablets (50 mg total) by mouth daily. 07/13/17  Yes Scot Jun, FNP  sevelamer (RENAGEL) 800 MG tablet Take 4,800 mg by mouth 3 (three) times daily with meals.    Yes [provider]    Family History Family History  Problem Relation Age of Onset  . Diabetes Father   . Hypertension Father   . Heart disease Father     Social History Social History   Tobacco Use  . Smoking status: Current Every Day Smoker    Packs/day: 1.00    Years: 25.00    Pack years: 25.00    Types: Cigarettes    Last attempt to quit: 08/01/2011    Years since quitting: 6.6  . Smokeless tobacco: Never Used  Substance Use Topics  . Alcohol use: Yes    Comment: occasionally  . Drug use: Yes    Types: Cocaine    Comment: clean x approx 2 months (07/22/17)     Allergies   Patient has no known allergies.   Review of Systems Review of Systems  Constitutional: Positive for fatigue. Negative for chills and fever.  HENT: Negative for congestion and rhinorrhea.   Eyes: Negative for visual disturbance.    Respiratory: Negative for cough, shortness of breath and wheezing.   Cardiovascular: Negative for chest pain and leg swelling.  Gastrointestinal: Negative for abdominal pain, diarrhea, nausea and vomiting.  Genitourinary: Negative for dysuria and flank pain.  Musculoskeletal: Positive for arthralgias and myalgias. Negative for neck pain and neck stiffness.  Skin: Negative for rash and wound.  Allergic/Immunologic: Negative for immunocompromised state.  Neurological: Positive for weakness. Negative for syncope and headaches.  All other systems reviewed and are negative.    Physical Exam Updated Vital Signs BP (!) 158/104 (BP Location: Right Arm)   Pulse 85   Temp 97.7 F (36.5 C) (Oral)   Resp 20   Ht 5\' 8"  (1.727 m)   Wt 75 kg   SpO2 97%   BMI 25.14  kg/m   Physical Exam  Constitutional: He is oriented to person, place, and time. He appears well-developed and well-nourished. He appears distressed.  HENT:  Head: Normocephalic and atraumatic.  Eyes: Conjunctivae are normal.  Neck: Neck supple.  Cardiovascular: Normal rate, regular rhythm and normal heart sounds. Exam reveals no friction rub.  No murmur heard. Pulmonary/Chest: Effort normal. No respiratory distress. He has no wheezes. He has no rales.  Abdominal: Soft. He exhibits no distension.  Musculoskeletal: He exhibits no edema.  Neurological: He is alert and oriented to person, place, and time. He exhibits normal muscle tone.  Skin: Skin is warm. Capillary refill takes less than 2 seconds.  Psychiatric: He has a normal mood and affect.  Nursing note and vitals reviewed.    ED Treatments / Results  Labs (all labs ordered are listed, but only abnormal results are displayed) Labs Reviewed  CBC WITH DIFFERENTIAL/PLATELET - Abnormal; Notable for the following components:      Result Value   WBC 2.6 (*)    RBC 3.37 (*)    Hemoglobin 11.3 (*)    HCT 37.2 (*)    MCV 110.4 (*)    Platelets 124 (*)    Lymphs Abs 0.4  (*)    All other components within normal limits  COMPREHENSIVE METABOLIC PANEL - Abnormal; Notable for the following components:   Sodium 134 (*)    Potassium 7.1 (*)    Chloride 96 (*)    CO2 18 (*)    Glucose, Bld 151 (*)    BUN 63 (*)    Creatinine, Ser 13.90 (*)    Calcium 7.8 (*)    Albumin 3.4 (*)    GFR calc non Af Amer 3 (*)    GFR calc Af Amer 4 (*)    Anion gap 20 (*)    All other components within normal limits  MAGNESIUM - Abnormal; Notable for the following components:   Magnesium 2.5 (*)    All other components within normal limits  AMMONIA - Abnormal; Notable for the following components:   Ammonia 74 (*)    All other components within normal limits  CBC - Abnormal; Notable for the following components:   WBC 3.3 (*)    RBC 3.00 (*)    Hemoglobin 10.0 (*)    HCT 32.3 (*)    MCV 107.7 (*)    Platelets 137 (*)    All other components within normal limits  CBG MONITORING, ED - Abnormal; Notable for the following components:   Glucose-Capillary 21 (*)    All other components within normal limits  I-STAT CHEM 8, ED - Abnormal; Notable for the following components:   Sodium 132 (*)    Potassium 7.0 (*)    BUN 75 (*)    Creatinine, Ser 15.10 (*)    Glucose, Bld 155 (*)    Calcium, Ion 0.87 (*)    Hemoglobin 12.6 (*)    HCT 37.0 (*)    All other components within normal limits  CBG MONITORING, ED - Abnormal; Notable for the following components:   Glucose-Capillary 146 (*)    All other components within normal limits  I-STAT CG4 LACTIC ACID, ED - Abnormal; Notable for the following components:   Lactic Acid, Venous 4.16 (*)    All other components within normal limits  I-STAT CG4 LACTIC ACID, ED - Abnormal; Notable for the following components:   Lactic Acid, Venous 3.83 (*)    All other components within normal limits  CULTURE, BLOOD (ROUTINE X 2)  CULTURE, BLOOD (ROUTINE X 2)  TROPONIN I  PROTIME-INR  RAPID URINE DRUG SCREEN, HOSP PERFORMED    URINALYSIS, ROUTINE W REFLEX MICROSCOPIC  I-STAT CHEM 8, ED  CBG MONITORING, ED    EKG EKG Interpretation  Date/Time:  Saturday March 31 2018 08:46:58 EDT Ventricular Rate:  80 PR Interval:    QRS Duration: 98 QT Interval:  429 QTC Calculation: 495 R Axis:   17 Text Interpretation:  Sinus rhythm Prolonged PR interval Abnormal R-wave progression, early transition Borderline prolonged QT interval Since last EKG, T waves are markedly peaked QT is now prolonged Confirmed by Duffy Bruce [phone]) on 03/31/2018 10:02:40 AM Also confirmed by Duffy Bruce [phone]), editor Lynder Parents [phone])  on 03/31/2018 10:55:10 AM   Radiology Ct Abdomen Pelvis Wo Contrast  Result Date: 03/31/2018 CLINICAL DATA:  Generalized weakness, nausea, vomiting, hematuria EXAM: CT ABDOMEN AND PELVIS WITHOUT CONTRAST TECHNIQUE: Multidetector CT imaging of the abdomen and pelvis was performed following the standard protocol without IV contrast. COMPARISON:  None. FINDINGS: Lower chest: Minor basilar atelectasis. Cardiomegaly without pericardial or pleural effusion. Descending thoracic aorta is atherosclerotic and mildly ectatic. Degenerative changes of the spine with osteophytes. Hepatobiliary: No focal hepatic abnormality or ductal dilatation within the limits of noncontrast imaging. Hyper attenuation in the gallbladder compatible with vicarious contrast excretion. Biliary system is nondilated. Pancreas: No significant focal abnormality by noncontrast imaging. No surrounding inflammatory process. Spleen: Normal in size without focal abnormality. Adrenals/Urinary Tract: Normal adrenal glands. Atrophic kidneys with numerous hypodense renal cysts compatible with end-stage renal disease. No renal obstruction or hydronephrosis. No hydroureter. Bladder is collapsed with mild wall thickening. Stomach/Bowel: Negative for bowel obstruction, significant dilatation, ileus, or free air. Scattered colonic diverticulosis. Normal  appearing appendix containing air. No acute inflammatory process, fluid collection, or abscess. No ascites. Vascular/Lymphatic: Atherosclerosis of aorta. Minor ectasia. No significant aneurysm. No retroperitoneal hemorrhage or hematoma. No adenopathy. Reproductive: Prostate gland unremarkable. Seminal vesicles are symmetric. High positioned right testicle as before. Other: No abdominal wall hernia or abnormality. No abdominopelvic ascites. Musculoskeletal: Degenerative changes noted. No acute osseous finding. IMPRESSION: No acute intra-abdominal or pelvic finding by noncontrast CT. Chronic renal atrophy and renal cysts related to end-stage renal disease. Vicarious contrast excretion in the gallbladder noted from recent contrast for coronary catheterization. Atherosclerosis as above Electronically Signed   By: Jerilynn Mages.  Shick M.D.   On: 03/31/2018 14:30   Ct Head Wo Contrast  Result Date: 03/31/2018 CLINICAL DATA:  Generalized weakness for a couple days. Hematuria. Unequal pupils. EXAM: CT HEAD WITHOUT CONTRAST TECHNIQUE: Contiguous axial images were obtained from the base of the skull through the vertex without intravenous contrast. COMPARISON:  Maxillofacial CT 07/22/2017.  Head CT 01/30/2008. FINDINGS: Brain: There is no evidence of acute intracranial hemorrhage, mass lesion, brain edema or extra-axial fluid collection. The ventricles and subarachnoid spaces are appropriately sized for age. There is no CT evidence of acute cortical infarction. There is patchy low-density in the periventricular white matter, likely due to chronic small vessel ischemic changes. Vascular: Intracranial vascular calcifications. No hyperdense vessel identified. Skull: Negative for fracture or focal lesion. Sinuses/Orbits: There is chronic deformity of the lamina papyracea on the left. The visualized paranasal sinuses, mastoid air cells and middle ears are clear. Interval lens surgery in the right orbit. Other: None. IMPRESSION: 1. No  acute intracranial findings. 2. Old facial fractures and previous right lens dislocation. 3. Mild chronic small vessel ischemic changes in the periventricular white matter. Electronically Signed  By: Richardean Sale M.D.   On: 03/31/2018 14:26   Dg Chest Portable 1 View  Result Date: 03/31/2018 CLINICAL DATA:  Shortness of breath. EXAM: PORTABLE CHEST 1 VIEW COMPARISON:  08/07/2017 and 06/20/2017 radiographs.  CT 06/12/2017. FINDINGS: 0902 hours. There is stable cardiomegaly. The lungs are clear. There is no pleural effusion or pneumothorax. There are stable prominent nipple shadows bilaterally. No acute osseous findings are seen. Telemetry leads overlie the chest. IMPRESSION: Interval resolution of left lung opacities. Stable cardiomegaly. No acute cardiopulmonary process. Electronically Signed   By: Richardean Sale M.D.   On: 03/31/2018 09:22    Procedures .Critical Care Performed by: Duffy Bruce, MD Authorized by: Duffy Bruce, MD   Critical care provider statement:    Critical care time (minutes):  35   Critical care time was exclusive of:  Separately billable procedures and treating other patients and teaching time   Critical care was necessary to treat or prevent imminent or life-threatening deterioration of the following conditions:  Cardiac failure, circulatory failure, sepsis and metabolic crisis   Critical care was time spent personally by me on the following activities:  Development of treatment plan with patient or surrogate, discussions with consultants, evaluation of patient's response to treatment, examination of patient, obtaining history from patient or surrogate, ordering and performing treatments and interventions, ordering and review of laboratory studies, ordering and review of radiographic studies, pulse oximetry, re-evaluation of patient's condition and review of old charts   I assumed direction of critical care for this patient from another provider in my specialty:  no     (including critical care time)  Medications Ordered in ED Medications  metroNIDAZOLE (FLAGYL) IVPB 500 mg (0 mg Intravenous Stopped 03/31/18 1238)  vancomycin (VANCOCIN) IVPB 750 mg/150 ml premix (has no administration in time range)  ceFEPIme (MAXIPIME) 2 g in sodium chloride 0.9 % 100 mL IVPB (has no administration in time range)  Chlorhexidine Gluconate Cloth 2 % PADS 6 each (has no administration in time range)  pentafluoroprop-tetrafluoroeth (GEBAUERS) aerosol 1 application (has no administration in time range)  lidocaine (PF) (XYLOCAINE) 1 % injection 5 mL (has no administration in time range)  lidocaine-prilocaine (EMLA) cream 1 application (has no administration in time range)  0.9 %  sodium chloride infusion (has no administration in time range)  0.9 %  sodium chloride infusion (has no administration in time range)  heparin injection 2,000 Units (has no administration in time range)  calcitRIOL (ROCALTROL) capsule 0.75 mcg (has no administration in time range)  cinacalcet (SENSIPAR) tablet 180 mg (has no administration in time range)  sodium chloride 0.9 % bolus 250 mL (has no administration in time range)  aspirin EC tablet 81 mg (has no administration in time range)  lactulose (CHRONULAC) 10 GM/15ML solution 20 g (has no administration in time range)  dextrose 50 % solution (  Given 03/31/18 0848)  insulin aspart (novoLOG) injection 5 Units (5 Units Intravenous Given 03/31/18 0952)  dextrose 50 % solution 50 mL (50 mLs Intravenous Given 03/31/18 0959)  calcium gluconate 1 g in sodium chloride 0.9 % 100 mL IVPB (0 g Intravenous Stopped 03/31/18 1126)  sodium chloride 0.9 % bolus 250 mL (0 mLs Intravenous Stopped 03/31/18 1101)  albuterol (PROVENTIL) (2.5 MG/3ML) 0.083% nebulizer solution 5 mg (5 mg Nebulization Given 03/31/18 0940)  vancomycin (VANCOCIN) 1,500 mg in sodium chloride 0.9 % 500 mL IVPB (0 mg Intravenous Stopped 03/31/18 1505)  ceFEPIme (MAXIPIME) 2 g in sodium  chloride 0.9 % 100  mL IVPB (0 g Intravenous Stopped 03/31/18 1101)  calcium gluconate 1 g in sodium chloride 0.9 % 100 mL IVPB (0 g Intravenous Stopped 03/31/18 1414)     Initial Impression / Assessment and Plan / ED Course  I have reviewed the triage vital signs and the nursing notes.  Pertinent labs & imaging results that were available during my care of the patient were reviewed by me and considered in my medical decision making (see chart for details).     62 year old male here with generalized weakness and hypoglycemia.  I suspect this could be due to poor p.o. intake and alcohol/drug use and dialysis nonadherence.  However, patient also hypothermic and with lactic acidosis on arrival.  Though I see no evidence of infection clinically, with no evidence of pneumonia, abdominal pain, urinary symptoms, skin rash, or other lesions, given his multiple comorbidities will activated as a code sepsis and cover empirically.  Will be very cautious and fluids in setting of his fluid overload from missing dialysis.  He is not hypotensive with no evidence of shock.  Regarding his lack of dialysis, potassium 7 and he has peaking of T waves on his EKG.  Potassium temporized and nephrology will take to dialysis.  Will admit to stepdown.  Final Clinical Impressions(s) / ED Diagnoses   Final diagnoses:  Lactic acidosis  Hyperkalemia  SIRS (systemic inflammatory response syndrome) (HCC)  ESRD (end stage renal disease) [facility])    ED Discharge Orders    None       Duffy Bruce, MD 03/31/18 1623

## 2018-03-31 NOTE — Consult Note (Addendum)
[facility] Renal Consultation Note    Indication for Consultation:  Management of ESRD/hemodialysis; anemia, hypertension/volume and secondary hyperparathyroidism PCP: Molli Barrows, FNP  HPI: Raymond [name] is a 62 y.o. male with ESRD on hemodialysis MWF at [facility]. PMH of HTN, polysubstance abuse, COPD, Hepatitis C (treated), SHPT, AOCD, non-compliance with HD. Last HD 03/28/2018 completed 3.22 hrs of 4 hr treatment. Left 0.8 kg above OP EDW.   Patient presented to ED this AM with AMS, hypoglycemia. Per Patient, he drank 40 oz of alcohol this AM, denies cocaine use and became weak. Upon arrival to ED, BS was 21. He received 1 amp D50W. He was noted to be hypothermic with WBC 2.6 lactic acid 4.16 HGB 11.3, K+ 7.0 SCr 15.1 BUN 75. CXR without acute cardiopulmonary process. EKG SR rate 80s with borderline Qt prolongation. He has been started on sepsis protocol per ED physician with Vanc/Cefepime/Metronidazole, has rec'd 1 amp calcium gluconate, insulin for hyperkalemia.   Currently patient is awake, alert and at baseline. He says he had muscle aches, subjective fever ("I felt hot") chills, nausea-no emesis yesterday. He denies chest pain, SOB, palpitations, abdominal pain, flank pain, bloody or tarry stools, constipation or diarrhea, headache, vision changes, sick exposure other than patients at HD center.   He is schedule for femoral-popiteal bypass grafting 04/11/2018 per Dr. Donnetta Hutching. Had cardiac cath 03/29/2018 for surgical clearance-cath showed mild nonobstructive CAD with normal LVEF and LVEDP.   Past Medical History:  Diagnosis Date  . Anemia   . COPD (chronic obstructive pulmonary disease) ([place])   . Dyspnea    "with too much fluid"  . ESRD (end stage renal disease) on dialysis [facility])    "MWF; [facility]" (07/22/17)  . Hemodialysis patient ([place])   . Hepatitis C    Still positive s/p liver biopsy at [facility]  and interferon therapy for 6 months. Most recent lab  work was on 10/24/12  . Hepatitis C    "took the tx; gone now" (12/05/2016)  Was treated  . History of blood transfusion ~ 2012/2013   "related to my kidneys; blood was low"  . Hypertension   . Substance abuse ([place])   . Thyroid disease    Past Surgical History:  Procedure Laterality Date  . ABDOMINAL AORTOGRAM W/LOWER EXTREMITY N/A 02/08/2018   Procedure: ABDOMINAL AORTOGRAM W/LOWER EXTREMITY;  Surgeon: Conrad Fitzgerald, MD;  Location: [place] [facility];  Service: Cardiovascular;  Laterality: N/A;  . AV FISTULA PLACEMENT Left Aug. 2013 ?  [name] LEFT HEART CATH AND CORONARY ANGIOGRAPHY N/A 03/29/2018   Procedure: LEFT HEART CATH AND CORONARY ANGIOGRAPHY;  Surgeon: Nelva Bush, MD;  Location: [place] [facility];  Service: Cardiovascular;  Laterality: N/A;  . LIVER BIOPSY    . ORIF ULNAR FRACTURE Left 05/18/2017   Procedure: OPEN REDUCTION INTERNAL FIXATION (ORIF) ULNAR FRACTURE;  Surgeon: Iran Planas, MD;  Location: [place];  Service: Orthopedics;  Laterality: Left;  . SHUNTOGRAM N/A 12/11/2012   Procedure: [place]ney Mallet;  Surgeon: Serafina Mitchell, MD;  Location: [facility] CATH LAB;  Service: Cardiovascular;  Laterality: N/A;   Family History  Problem Relation Age of Onset  . Diabetes Father   . Hypertension Father   . Heart disease Father    Social History:  reports that he has been smoking cigarettes. He has a 25.00 pack-year smoking history. He has never used smokeless tobacco. He reports that he drinks alcohol. He reports that he has current or past drug history. Drug: Cocaine.  No Known Allergies Prior to Admission medications   Medication Sig Start Date End Date Taking? Authorizing Provider  acetaminophen (TYLENOL) 325 MG tablet Take 2 tablets (650 mg total) by mouth every 6 (six) hours as needed. Patient taking differently: Take 650 mg by mouth every 6 (six) hours as needed for mild pain.  07/22/17  Yes Varney Biles, MD  albuterol (PROVENTIL HFA;VENTOLIN HFA) 108 (90 Base) MCG/ACT inhaler  Inhale 2 puffs into the lungs every 6 (six) hours as needed for wheezing or shortness of breath. 12/06/16  Yes Lorella Nimrod, MD  amLODipine (NORVASC) 5 MG tablet Take 1 tablet (5 mg total) by mouth daily. 09/12/17  Yes Scot Jun, FNP  aspirin EC 81 MG tablet Take 81 mg by mouth daily.   Yes [provider]  atorvastatin (LIPITOR) 40 MG tablet Take 1 tablet (40 mg total) by mouth daily. 03/27/18 06/25/18 Yes Isaiah Serge, NP  cinacalcet (SENSIPAR) 30 MG tablet Take 30 mg by mouth daily.  10/12/15  Yes [provider]  gabapentin (NEURONTIN) 300 MG capsule TAKE 1 CAPSULE BY MOUTH AT BEDTIME AS NEEDED FOR SLEEP Patient taking differently: Take 300 mg by mouth at bedtime as needed (sleep).  09/12/17  Yes Scot Jun, FNP  metoprolol succinate (TOPROL-XL) 25 MG 24 hr tablet Take 2 tablets (50 mg total) by mouth daily. 07/13/17  Yes Scot Jun, FNP  sevelamer (RENAGEL) 800 MG tablet Take 4,800 mg by mouth 3 (three) times daily with meals.    Yes [provider]   Current Facility-Administered Medications  Medication Dose Route Frequency Provider Last Rate Last Dose  . calcitRIOL (ROCALTROL) capsule 0.75 mcg  0.75 mcg Oral Q M,W,F Valentina Gu, NP      . calcium gluconate 1 g in sodium chloride 0.9 % 100 mL IVPB  1 g Intravenous Once Duffy Bruce, MD 330 mL/hr at 03/31/18 1101 1 g at 03/31/18 1101  . ceFEPIme (MAXIPIME) 2 g in sodium chloride 0.9 % 100 mL IVPB  2 g Intravenous Q M,W,F-HD Karren Cobble, RPH      . Chlorhexidine Gluconate Cloth 2 % PADS 6 each  6 each Topical Q0600 Valentina Gu, NP      . cinacalcet [facility]) tablet 180 mg  180 mg Oral Q M,W,F-HD Valentina Gu, NP      . metroNIDAZOLE (FLAGYL) IVPB 500 mg  500 mg Intravenous Marcella Dubs, MD      . vancomycin (VANCOCIN) 1,500 mg in sodium chloride 0.9 % 500 mL IVPB  1,500 mg Intravenous STAT Karren Cobble, [place]      . vancomycin (VANCOCIN) IVPB 750  mg/150 ml premix  750 mg Intravenous Q M,W,F-HD Karren Cobble, [facility]       Current Outpatient Medications  Medication Sig Dispense Refill  . acetaminophen (TYLENOL) 325 MG tablet Take 2 tablets (650 mg total) by mouth every 6 (six) hours as needed. (Patient taking differently: Take 650 mg by mouth every 6 (six) hours as needed for mild pain. ) 30 tablet 0  . albuterol (PROVENTIL HFA;VENTOLIN HFA) 108 (90 Base) MCG/ACT inhaler Inhale 2 puffs into the lungs every 6 (six) hours as needed for wheezing or shortness of breath. 1 Inhaler 2  . amLODipine (NORVASC) 5 MG tablet Take 1 tablet (5 mg total) by mouth daily. 90 tablet 1  . aspirin EC 81 MG tablet Take 81 mg by mouth daily.    [name] atorvastatin (LIPITOR) 40 MG tablet  Take 1 tablet (40 mg total) by mouth daily. 90 tablet 3  . cinacalcet (SENSIPAR) 30 MG tablet Take 30 mg by mouth daily.     [name] gabapentin (NEURONTIN) 300 MG capsule TAKE 1 CAPSULE BY MOUTH AT BEDTIME AS NEEDED FOR SLEEP (Patient taking differently: Take 300 mg by mouth at bedtime as needed (sleep). ) 90 capsule 3  . metoprolol succinate (TOPROL-XL) 25 MG 24 hr tablet Take 2 tablets (50 mg total) by mouth daily. 120 tablet 1  . sevelamer (RENAGEL) 800 MG tablet Take 4,800 mg by mouth 3 (three) times daily with meals.      Facility-Administered Medications Ordered in Other Encounters  Medication Dose Route Frequency Provider Last Rate Last Dose  . midazolam (VERSED) 5 MG/5ML injection    Anesthesia Intra-op Sammie Bench, CRNA   1 mg at 05/17/17 1631   Labs: Basic Metabolic Panel: Recent Labs  Lab 03/27/18 1420 03/29/18 0802 03/31/18 0918  NA 144 141 132*  K 6.3* 5.2* 7.0*  CL 96 97* 101  CO2 25 29  --   GLUCOSE 81 87 155*  BUN 57* 32* 75*  CREATININE 13.23* 9.77* 15.10*  CALCIUM 7.8* 7.6*  --    CBC: Recent Labs  Lab 03/27/18 1420 03/31/18 0907 03/31/18 0918  WBC 3.5 2.6*  --   NEUTROABS 2.0 2.1  --   HGB 11.0* 11.3* 12.6*  HCT 32.7* 37.2* 37.0*  MCV  101* 110.4*  --   PLT 174 124*  --    Cardiac Enzymes: Recent Labs  Lab 03/31/18 0907  TROPONINI <0.03   CBG: Recent Labs  Lab 03/31/18 0843 03/31/18 0905  GLUCAP 21* 146*   Dg Chest Portable 1 View  Result Date: 03/31/2018 CLINICAL DATA:  Shortness of breath. EXAM: PORTABLE CHEST 1 VIEW COMPARISON:  08/07/2017 and 06/20/2017 radiographs.  CT 06/12/2017. FINDINGS: 0902 hours. There is stable cardiomegaly. The lungs are clear. There is no pleural effusion or pneumothorax. There are stable prominent nipple shadows bilaterally. No acute osseous findings are seen. Telemetry leads overlie the chest. IMPRESSION: Interval resolution of left lung opacities. Stable cardiomegaly. No acute cardiopulmonary process. Electronically Signed   By: Richardean Sale M.D.   On: 03/31/2018 09:22    ROS: As per HPI otherwise negative.   Physical Exam: Vitals:   03/31/18 1015 03/31/18 1030 03/31/18 1045 03/31/18 1100  BP: (!) 186/98 (!) 170/101 (!) 165/98 (!) 176/92  Pulse: 85 87 87 (!) 55  Resp: 14 13 14  (!) 31  Temp:      TempSrc:      SpO2: 97% 98% 99% 100%  Weight:      Height:         General: Chronically ill appearing male in no acute distress. Head: Normocephalic, atraumatic, sclera non-icteric, mucus membranes are moist Neck: Supple. JVD not elevated. Lungs: Clear bilaterally to auscultation without wheezes, rales, or rhonchi. Breathing is unlabored. Heart: RRR with S1 S2. No murmurs, rubs, or gallops appreciated. Abdomen: Soft, non-tender, non-distended with normoactive bowel sounds. No rebound/guarding. No obvious abdominal masses. M-S:  Strength and tone appear normal for age. Lower extremities: LLE > RLE. Trace LLE edema.  Neuro: Alert and oriented X 3. Moves all extremities spontaneously. Psych:  Responds to questions appropriately with a normal affect. Dialysis Access: L AVF + bruit.   Dialysis Orders: [facility] MWF 4 hrs 180 NRe 450/800  72 kg 2.0 K/2.0 Ca UFP 4 -Heparin 2000 units  IV TIW -Sensipar 382 mg PO TIW -Calicitriol 5.05  mcg PO TIW -Mircera 150 mcg IV q 2 weeks (last dose 03/28/2018) -Venofer 50 mg IV weekly (last dose 03/28/2018)  Assessment/Plan: 1.  Sepsis: WBC 2.6 hypothermic-Temp 94.8 Lactic Acid 4.16 Vanc/Cefepime/Metronidazole per sepsis protocol.  2. Hypoglycemia-BS 21 on arrival to ED. Rec'd D50W. Now 51s.  3. Hyperkalemia-missed HD 03/30/18. Calcium gluconate, insulin, D50W in ED. Start HD on 1.0 K bath for 1 hour then switch to 2.0 K bath. Follow K+  4.  PAD-Scheduled for Fem-pop bypass grafting 04/11/18 per Dr. Donnetta Hutching.  5.  ESRD - MWF missed HD 03/30/18. Urgent HD today for hyperkalemia. Usual heparin.  6.  Hypertension/volume  - Very hypertensive. Denies cocaine use, says he drank 40 oz alcohol this AM. Has not been getting to OP EDW D/T truncated treatments. CXR unremarkable. No evidence of substantial volume overload on exam. Push to OP EDW 72. Continue amlodipine/metoprolol tartrate when able to take POs.  7.  Anemia  - HGB 11.0 Recent ESA dose. Monitor HGB.  8.  Metabolic bone disease -continue binders, VDRA, recheck Ca during HD.  9.  Nutrition - NPO at present. Renal diet/fluid restrictions when able to eat.  10.  H/O polysubstance abuse 11. H/O Hep. C. Has been treated.   Rita H. Owens Shark, NP-C 03/31/2018, 11:11 AM  [employer] [phone]  Pt seen, examined and agree w A/P as above.  Pt had schedule OP left heart cath 2d ago as part of preop evaluatoin for surgery.  Missed HD yesterday and then comes in now today w/ fevers and low BS, unclear etiology.  Also K+ is very high and BP's are up.  Pt looks good in spite of bad labs, low grade fevers.  Plan is for HD today.   Kelly Splinter MD [employer] pager [phone]   03/31/2018, 12:26 PM

## 2018-03-31 NOTE — ED Triage Notes (Addendum)
Per EMS- Pt arrives after friend called out for weakness. Pt noted to be hypoglycemic with a CBG of 40, endorses ETOH 40 ounce, Pt missed dialysis Friday. EMS gave D10 and oral glucose. Repeat was CBG 79. PIV 18G to RAC placed. EKG shows peaked T waves. Friend reports possible cocaine use as well. Pt has uneven pupils but states this is baseline. Pt had X mark to left foot and bandage to left groin, states he had a cath done Thursday.

## 2018-03-31 NOTE — ED Notes (Signed)
Pt back from CT

## 2018-03-31 NOTE — Progress Notes (Signed)
Received pt from Randall Hiss, Therapist, sports, HD tx initiated @ 1534 via 15Gx2 w/o problem per report by Randall Hiss, RN, pull/push/flush well w/o problem per report, VSS per report, will cont to monitor while on HD tx

## 2018-03-31 NOTE — Progress Notes (Signed)
HD tx completed @ 1934 w/o problem, UF goal met, blood rinsed back, VSS, report called to Tivis Ringer, RN

## 2018-04-01 DIAGNOSIS — I10 Essential (primary) hypertension: Secondary | ICD-10-CM

## 2018-04-01 DIAGNOSIS — N186 End stage renal disease: Secondary | ICD-10-CM

## 2018-04-01 DIAGNOSIS — E162 Hypoglycemia, unspecified: Secondary | ICD-10-CM

## 2018-04-01 DIAGNOSIS — F1910 Other psychoactive substance abuse, uncomplicated: Secondary | ICD-10-CM

## 2018-04-01 LAB — RENAL FUNCTION PANEL
ALBUMIN: 3 g/dL — AB (ref 3.5–5.0)
ANION GAP: 15 (ref 5–15)
BUN: 25 mg/dL — AB (ref 8–23)
CHLORIDE: 97 mmol/L — AB (ref 98–111)
CO2: 25 mmol/L (ref 22–32)
Calcium: 8.1 mg/dL — ABNORMAL LOW (ref 8.9–10.3)
Creatinine, Ser: 7.52 mg/dL — ABNORMAL HIGH (ref 0.61–1.24)
GFR calc Af Amer: 8 mL/min — ABNORMAL LOW (ref >60)
GFR calc non Af Amer: 7 mL/min — ABNORMAL LOW (ref >60)
GLUCOSE: 62 mg/dL — AB (ref 70–99)
PHOSPHORUS: 5.7 mg/dL — AB (ref 2.5–4.6)
POTASSIUM: 4.6 mmol/L (ref 3.5–5.1)
Sodium: 137 mmol/L (ref 135–145)

## 2018-04-01 LAB — CBC
HCT: 34.7 % — ABNORMAL LOW (ref 39.0–52.0)
HEMOGLOBIN: 10.7 g/dL — AB (ref 13.0–17.0)
MCH: 33.5 pg (ref 26.0–34.0)
MCHC: 30.8 g/dL (ref 30.0–36.0)
MCV: 108.8 fL — AB (ref 78.0–100.0)
PLATELETS: 150 10*3/uL (ref 150–400)
RBC: 3.19 MIL/uL — AB (ref 4.22–5.81)
RDW: 15.6 % — ABNORMAL HIGH (ref 11.5–15.5)
WBC: 2.5 10*3/uL — AB (ref 4.0–10.5)

## 2018-04-01 LAB — GLUCOSE, CAPILLARY
GLUCOSE-CAPILLARY: 144 mg/dL — AB (ref 70–99)
Glucose-Capillary: 122 mg/dL — ABNORMAL HIGH (ref 70–99)

## 2018-04-01 LAB — TSH: TSH: 2.47 u[IU]/mL (ref 0.350–4.500)

## 2018-04-01 LAB — CORTISOL: CORTISOL PLASMA: 13.9 ug/dL

## 2018-04-01 LAB — HIV ANTIBODY (ROUTINE TESTING W REFLEX): HIV Screen 4th Generation wRfx: NONREACTIVE

## 2018-04-01 LAB — PROCALCITONIN: Procalcitonin: 0.5 ng/mL

## 2018-04-01 MED ORDER — PNEUMOCOCCAL VAC POLYVALENT 25 MCG/0.5ML IJ INJ
0.5000 mL | INJECTION | INTRAMUSCULAR | Status: AC
  Administered 2018-04-02: 0.5 mL via INTRAMUSCULAR
  Filled 2018-04-01: qty 0.5

## 2018-04-01 MED ORDER — INFLUENZA VAC SPLIT QUAD 0.5 ML IM SUSY
0.5000 mL | PREFILLED_SYRINGE | INTRAMUSCULAR | Status: AC
  Administered 2018-04-01: 0.5 mL via INTRAMUSCULAR
  Filled 2018-04-01: qty 0.5

## 2018-04-01 MED ORDER — METOPROLOL TARTRATE 50 MG PO TABS
50.0000 mg | ORAL_TABLET | Freq: Two times a day (BID) | ORAL | Status: DC
  Administered 2018-04-01 – 2018-04-03 (×5): 50 mg via ORAL
  Filled 2018-04-01 (×6): qty 1

## 2018-04-01 MED ORDER — SEVELAMER CARBONATE 800 MG PO TABS
4000.0000 mg | ORAL_TABLET | Freq: Three times a day (TID) | ORAL | Status: DC
  Administered 2018-04-01 – 2018-04-03 (×4): 4000 mg via ORAL
  Filled 2018-04-01 (×4): qty 5

## 2018-04-01 NOTE — Progress Notes (Signed)
@IPLOG @        PROGRESS NOTE                                                                                                                                                                                                             Patient Demographics:    Raymond [name], is a 62 y.o. male, DOB - [birth year], KGM:[id number]  Admit date - 03/31/2018   Admitting Physician Patrecia Pour, MD  Outpatient Primary MD for the patient is Scot Jun, FNP  LOS - 1  Chief Complaint  Patient presents with  . Needs Dialysis  . Hypoglycemia  . Alcohol Intoxication       Brief Narrative  CLIFFTON [name] is a 62 y.o. male with a history of ESRD, PVD, polysubstance use, HTN, and treated hepatitis C who presented to the ED with general weakness. He reported feeling diffusely weak since the previous evening, worsening and constant, feeling hot and cold, but had no localizing symptoms, he was admitted with the diagnosis of sepsis of unknown etiology with negative CT scan abdomen pelvis along with sepsis causing hypoglycemia.   Subjective:    Iram Molina today has, No headache, No chest pain, No abdominal pain - No Nausea, No new weakness tingling or numbness, No Cough - SOB.     Assessment  & Plan :     1.  Hypothermia, hypoglycemia in a patient with ESRD with suspected sepsis of unclear etiology.  He has no localizing symptoms, his UA likely appears dirty due to his anuric ESRD state, his hypothermia can be explained by hypoglycemia which in turn was due to excessive alcohol binging and decreased oral intake.  Will initiate diet, monitor CBGs, check peptide, TSH and random cortisol, hypothermia has resolved, his procalcitonin is stable and I doubt he has a true infection.  For now we will cover with antibiotics until cultures are finalized.  Does not appear toxic.  2.  ESRD.  Missed dialysis.  Was counseled to be compliant.  Nephrology on board.  Dialysis per schedule.  3.  Polysubstance abuse and  intermittent alcohol binging.  Consult to quit all, no signs of withdrawal will closely monitor.  He says he does not drink on a daily basis.  4.  Dyslipidemia.  On statin.  5.  PAD.  On combination of aspirin and statin for secondary prevention continue.  No acute issues.  6.  Hypertension.  Placed on metoprolol will continue to monitor.    Family Communication  :  None  Code Status :  Full  Disposition Plan  :  Stepdown  Consults  :  Renal  Procedures  :     CT head - non acute  CT -  No acute intra-abdominal or pelvic finding by noncontrast CT. Chronic renal atrophy and renal cysts related to end-stage renal disease. Vicarious contrast excretion in the gallbladder noted from recent contrast for coronary catheterization.  DVT Prophylaxis  :   Heparin   Lab Results  Component Value Date   PLT 150 04/01/2018    Diet :  Diet Order            Diet renal with fluid restriction Fluid restriction: 1200 mL Fluid; Room service appropriate? Yes; Fluid consistency: Thin  Diet effective now               Inpatient Medications Scheduled Meds: . amLODipine  5 mg Oral Daily  . aspirin EC  81 mg Oral Daily  . atorvastatin  40 mg Oral Daily  . calcitRIOL  0.75 mcg Oral Q M,W,F  . Chlorhexidine Gluconate Cloth  6 each Topical Q0600  . [START ON 04/02/2018] cinacalcet  180 mg Oral Q M,W,F-HD  . heparin  5,000 Units Subcutaneous Q8H  . lactulose  20 g Oral BID  . metoprolol tartrate  50 mg Oral BID  . [START ON 04/02/2018] pneumococcal 23 valent vaccine  0.5 mL Intramuscular Tomorrow-1000  . sevelamer carbonate  4,000 mg Oral TID WC  . sodium chloride flush  3 mL Intravenous Q12H   Continuous Infusions: . sodium chloride    . sodium chloride    . ceFEPime (MAXIPIME) IV Stopped (04/01/18 1034)  . metronidazole 500 mg (04/01/18 0539)  . sodium chloride    . vancomycin     PRN Meds:.sodium chloride, sodium chloride, acetaminophen **OR** [DISCONTINUED] acetaminophen,  albuterol, lidocaine (PF), lidocaine-prilocaine, [DISCONTINUED] ondansetron **OR** ondansetron (ZOFRAN) IV, pentafluoroprop-tetrafluoroeth  Antibiotics  :   Anti-infectives (From admission, onward)   Start     Dose/Rate Route Frequency Ordered Stop   03/31/18 2230  ceFEPIme (MAXIPIME) 2 g in sodium chloride 0.9 % 100 mL IVPB     2 g 200 mL/hr over 30 Minutes Intravenous  Once 03/31/18 2147 04/01/18 1018   03/31/18 2230  vancomycin (VANCOCIN) IVPB 750 mg/150 ml premix     750 mg 150 mL/hr over 60 Minutes Intravenous  Once 03/31/18 2149 04/01/18 1109   03/31/18 2200  metroNIDAZOLE (FLAGYL) IVPB 500 mg     500 mg 100 mL/hr over 60 Minutes Intravenous Every 8 hours 03/31/18 2144     03/31/18 1830  Vancomycin (VANCOCIN) 750-5 MG/150ML-% IVPB    Note to Pharmacy:  Yehuda Savannah   : cabinet override      03/31/18 1830 04/01/18 0644   03/31/18 1800  vancomycin (VANCOCIN) IVPB 750 mg/150 ml premix     750 mg 150 mL/hr over 60 Minutes Intravenous Every M-W-F (Hemodialysis) 03/31/18 0943     03/31/18 1800  ceFEPIme (MAXIPIME) 2 g in sodium chloride 0.9 % 100 mL IVPB     2 g 200 mL/hr over 30 Minutes Intravenous Every M-W-F (Hemodialysis) 03/31/18 0943     03/31/18 0945  vancomycin (VANCOCIN) 1,500 mg in sodium chloride 0.9 % 500 mL IVPB     1,500 mg 250 mL/hr over 120 Minutes Intravenous STAT 03/31/18 0937 03/31/18 1505   03/31/18 0945  ceFEPIme (MAXIPIME) 2 g in sodium chloride 0.9 % 100 mL IVPB     2 g 200 mL/hr  over 30 Minutes Intravenous STAT 03/31/18 0937 03/31/18 1101   03/31/18 0930  ceFEPIme (MAXIPIME) 2 g in sodium chloride 0.9 % 100 mL IVPB  Status:  Discontinued     2 g 200 mL/hr over 30 Minutes Intravenous  Once 03/31/18 0922 03/31/18 0943   03/31/18 0930  metroNIDAZOLE (FLAGYL) IVPB 500 mg  Status:  Discontinued     500 mg 100 mL/hr over 60 Minutes Intravenous Every 8 hours 03/31/18 0922 03/31/18 2144   03/31/18 0930  vancomycin (VANCOCIN) IVPB 1000 mg/200 mL premix  Status:   Discontinued     1,000 mg 200 mL/hr over 60 Minutes Intravenous  Once 03/31/18 0922 03/31/18 0943          Objective:   Vitals:   03/31/18 1955 03/31/18 2355 04/01/18 0106 04/01/18 0350  BP: (!) 166/73 (!) 155/91 (!) 145/77 (!) 152/97  Pulse: 76 84 75 79  Resp: 15 (!) 22 13 13   Temp: 97.8 F (36.6 C) 98 F (36.7 C)  98.4 F (36.9 C)  TempSrc: Oral Oral  Oral  SpO2: 100% 98% 94% 98%  Weight: 72 kg     Height:        Wt Readings from Last 3 Encounters:  03/31/18 72 kg  03/29/18 76.2 kg  03/27/18 73.8 kg     Intake/Output Summary (Last 24 hours) at 04/01/2018 1131 Last data filed at 03/31/2018 1934 Gross per 24 hour  Intake -  Output 3000 ml  Net -3000 ml     Physical Exam  Awake Alert, Oriented X 3, No new F.N deficits, Normal affect Hundred.AT,PERRAL Supple Neck,No JVD, No cervical lymphadenopathy appriciated.  Symmetrical Chest wall movement, Good air movement bilaterally, CTAB RRR,No Gallops,Rubs or new Murmurs, No Parasternal Heave +ve B.Sounds, Abd Soft, No tenderness, No organomegaly appriciated, No rebound - guarding or rigidity. No Cyanosis, Clubbing or edema, No new Rash or bruise , left arm dialysis graft site appears stable and clean    Data Review:    CBC Recent Labs  Lab 03/27/18 1420 03/31/18 0907 03/31/18 0918 03/31/18 1604 04/01/18 0736  WBC 3.5 2.6*  --  3.3* 2.5*  HGB 11.0* 11.3* 12.6* 10.0* 10.7*  HCT 32.7* 37.2* 37.0* 32.3* 34.7*  PLT 174 124*  --  137* 150  MCV 101* 110.4*  --  107.7* 108.8*  MCH 33.8* 33.5  --  33.3 33.5  MCHC 33.6 30.4  --  31.0 30.8  RDW 14.6 15.4  --  15.4 15.6*  LYMPHSABS 0.9 0.4*  --   --   --   MONOABS  --  0.1  --   --   --   EOSABS 0.1 0.0  --   --   --   BASOSABS 0.0 0.0  --   --   --     Chemistries  Recent Labs  Lab 03/27/18 1420 03/29/18 0802 03/31/18 0918 03/31/18 1050 03/31/18 1642 04/01/18 0551  NA 144 141 132* 134*  --  137  K 6.3* 5.2* 7.0* 7.1* 3.7 4.6  CL 96 97* 101 96*  --  97*   CO2 25 29  --  18*  --  25  GLUCOSE 81 87 155* 151*  --  62*  BUN 57* 32* 75* 63*  --  25*  CREATININE 13.23* 9.77* 15.10* 13.90*  --  7.52*  CALCIUM 7.8* 7.6*  --  7.8*  --  8.1*  MG  --   --   --  2.5*  --   --  AST  --   --   --  25  --   --   ALT  --   --   --  10  --   --   ALKPHOS  --   --   --  56  --   --   BILITOT  --   --   --  0.7  --   --    ------------------------------------------------------------------------------------------------------------------ No results for input(s): CHOL, HDL, LDLCALC, TRIG, CHOLHDL, LDLDIRECT in the last 72 hours.  Lab Results  Component Value Date   HGBA1C 4.6 (L) 07/13/2017   ------------------------------------------------------------------------------------------------------------------ No results for input(s): TSH, T4TOTAL, T3FREE, THYROIDAB in the last 72 hours.  Invalid input(s): FREET3 ------------------------------------------------------------------------------------------------------------------ No results for input(s): VITAMINB12, FOLATE, FERRITIN, TIBC, IRON, RETICCTPCT in the last 72 hours.  Coagulation profile Recent Labs  Lab 03/31/18 0942  INR 1.09    No results for input(s): DDIMER in the last 72 hours.  Cardiac Enzymes Recent Labs  Lab 03/31/18 0907  TROPONINI <0.03   ------------------------------------------------------------------------------------------------------------------    Component Value Date/Time   BNP 1,215.6 (H) 12/05/2016 0406    Micro Results Recent Results (from the past 240 hour(s))  Blood culture (routine x 2)     Status: None (Preliminary result)   Collection Time: 03/31/18  9:30 AM  Result Value Ref Range Status   Specimen Description BLOOD RIGHT HAND  Final   Special Requests   Final    BOTTLES DRAWN AEROBIC AND ANAEROBIC Blood Culture adequate volume   Culture   Final    NO GROWTH < 24 HOURS Performed at [facility], [address], [place], [place] [address]     Report Status PENDING  Incomplete  Blood culture (routine x 2)     Status: None (Preliminary result)   Collection Time: 03/31/18  9:40 AM  Result Value Ref Range Status   Specimen Description BLOOD RIGHT HAND  Final   Special Requests   Final    BOTTLES DRAWN AEROBIC AND ANAEROBIC Blood Culture adequate volume   Culture   Final    NO GROWTH < 24 HOURS Performed at [facility], [place] [address] [place], [place] [address]    Report Status PENDING  Incomplete    Radiology Reports Ct Abdomen Pelvis Wo Contrast  Result Date: 03/31/2018 CLINICAL DATA:  Generalized weakness, nausea, vomiting, hematuria EXAM: CT ABDOMEN AND PELVIS WITHOUT CONTRAST TECHNIQUE: Multidetector CT imaging of the abdomen and pelvis was performed following the standard protocol without IV contrast. COMPARISON:  None. FINDINGS: Lower chest: Minor basilar atelectasis. Cardiomegaly without pericardial or pleural effusion. Descending thoracic aorta is atherosclerotic and mildly ectatic. Degenerative changes of the spine with osteophytes. Hepatobiliary: No focal hepatic abnormality or ductal dilatation within the limits of noncontrast imaging. Hyper attenuation in the gallbladder compatible with vicarious contrast excretion. Biliary system is nondilated. Pancreas: No significant focal abnormality by noncontrast imaging. No surrounding inflammatory process. Spleen: Normal in size without focal abnormality. Adrenals/Urinary Tract: Normal adrenal glands. Atrophic kidneys with numerous hypodense renal cysts compatible with end-stage renal disease. No renal obstruction or hydronephrosis. No hydroureter. Bladder is collapsed with mild wall thickening. Stomach/Bowel: Negative for bowel obstruction, significant dilatation, ileus, or free air. Scattered colonic diverticulosis. Normal appearing appendix containing air. No acute inflammatory process, fluid collection, or abscess. No ascites. Vascular/Lymphatic: Atherosclerosis of aorta.  Minor ectasia. No significant aneurysm. No retroperitoneal hemorrhage or hematoma. No adenopathy. Reproductive: Prostate gland unremarkable. Seminal vesicles are symmetric. High positioned right testicle as  before. Other: No abdominal wall hernia or abnormality. No abdominopelvic ascites. Musculoskeletal: Degenerative changes noted. No acute osseous finding. IMPRESSION: No acute intra-abdominal or pelvic finding by noncontrast CT. Chronic renal atrophy and renal cysts related to end-stage renal disease. Vicarious contrast excretion in the gallbladder noted from recent contrast for coronary catheterization. Atherosclerosis as above Electronically Signed   By: Jerilynn Mages.  Shick M.D.   On: 03/31/2018 14:30   Ct Head Wo Contrast  Result Date: 03/31/2018 CLINICAL DATA:  Generalized weakness for a couple days. Hematuria. Unequal pupils. EXAM: CT HEAD WITHOUT CONTRAST TECHNIQUE: Contiguous axial images were obtained from the base of the skull through the vertex without intravenous contrast. COMPARISON:  Maxillofacial CT 07/22/2017.  Head CT 01/30/2008. FINDINGS: Brain: There is no evidence of acute intracranial hemorrhage, mass lesion, brain edema or extra-axial fluid collection. The ventricles and subarachnoid spaces are appropriately sized for age. There is no CT evidence of acute cortical infarction. There is patchy low-density in the periventricular white matter, likely due to chronic small vessel ischemic changes. Vascular: Intracranial vascular calcifications. No hyperdense vessel identified. Skull: Negative for fracture or focal lesion. Sinuses/Orbits: There is chronic deformity of the lamina papyracea on the left. The visualized paranasal sinuses, mastoid air cells and middle ears are clear. Interval lens surgery in the right orbit. Other: None. IMPRESSION: 1. No acute intracranial findings. 2. Old facial fractures and previous right lens dislocation. 3. Mild chronic small vessel ischemic changes in the periventricular  white matter. Electronically Signed   By: Richardean Sale M.D.   On: 03/31/2018 14:26   Dg Chest Portable 1 View  Result Date: 03/31/2018 CLINICAL DATA:  Shortness of breath. EXAM: PORTABLE CHEST 1 VIEW COMPARISON:  08/07/2017 and 06/20/2017 radiographs.  CT 06/12/2017. FINDINGS: 0902 hours. There is stable cardiomegaly. The lungs are clear. There is no pleural effusion or pneumothorax. There are stable prominent nipple shadows bilaterally. No acute osseous findings are seen. Telemetry leads overlie the chest. IMPRESSION: Interval resolution of left lung opacities. Stable cardiomegaly. No acute cardiopulmonary process. Electronically Signed   By: Richardean Sale M.D.   On: 03/31/2018 09:22    Time Spent in minutes  30   Lala Lund M.D on 04/01/2018 at 11:31 AM  To page go to [email or web address] - password [email or web address]

## 2018-04-01 NOTE — Progress Notes (Addendum)
[facility] Progress Note   Subjective: Looks well. No C/Os.  Objective Vitals:   03/31/18 1955 03/31/18 2355 04/01/18 0106 04/01/18 0350  BP: (!) 166/73 (!) 155/91 (!) 145/77 (!) 152/97  Pulse: 76 84 75 79  Resp: 15 (!) 22 13 13   Temp: 97.8 F (36.6 C) 98 F (36.7 C)  98.4 F (36.9 C)  TempSrc: Oral Oral  Oral  SpO2: 100% 98% 94% 98%  Weight: 72 kg     Height:       Physical Exam General: Well appearing male in NAD Heart: S1,S2 SR on monitor. No M/R/G Lungs: CTAB A/P Abdomen: Active BS  Extremities:LLE > RLE no edema.  Dialysis Access: L AVF aneursymal. + bruit.   Additional Objective Labs: Basic Metabolic Panel: Recent Labs  Lab 03/29/18 0802 03/31/18 0918 03/31/18 1050 03/31/18 1642 04/01/18 0551  NA 141 132* 134*  --  137  K 5.2* 7.0* 7.1* 3.7 4.6  CL 97* 101 96*  --  97*  CO2 29  --  18*  --  25  GLUCOSE 87 155* 151*  --  62*  BUN 32* 75* 63*  --  25*  CREATININE 9.77* 15.10* 13.90*  --  7.52*  CALCIUM 7.6*  --  7.8*  --  8.1*  PHOS  --   --   --   --  5.7*   Liver Function Tests: Recent Labs  Lab 03/31/18 1050 04/01/18 0551  AST 25  --   ALT 10  --   ALKPHOS 56  --   BILITOT 0.7  --   PROT 7.3  --   ALBUMIN 3.4* 3.0*   No results for input(s): LIPASE, AMYLASE in the last 168 hours. CBC: Recent Labs  Lab 03/27/18 1420  03/31/18 0907 03/31/18 0918 03/31/18 1604 04/01/18 0736  WBC 3.5  --  2.6*  --  3.3* 2.5*  NEUTROABS 2.0  --  2.1  --   --   --   HGB 11.0*   < > 11.3* 12.6* 10.0* 10.7*  HCT 32.7*   < > 37.2* 37.0* 32.3* 34.7*  MCV 101*  --  110.4*  --  107.7* 108.8*  PLT 174  --  124*  --  137* 150   < > = values in this interval not displayed.   Blood Culture    Component Value Date/Time   SDES BLOOD RIGHT ANTECUBITAL 08/07/2017 1235   SPECREQUEST  08/07/2017 1235    BOTTLES DRAWN AEROBIC AND ANAEROBIC Blood Culture adequate volume   CULT  08/07/2017 1235    NO GROWTH 5 DAYS Performed at [facility], [place] [address] [place], [place] [address]    REPTSTATUS 08/12/2017 FINAL 08/07/2017 1235    Cardiac Enzymes: Recent Labs  Lab 03/31/18 0907  TROPONINI <0.03   CBG: Recent Labs  Lab 03/31/18 0843 03/31/18 0905 03/31/18 1118  GLUCAP 21* 146* 70   Iron Studies: No results for input(s): IRON, TIBC, TRANSFERRIN, FERRITIN in the last 72 hours. @lablastinr3 @ Studies/Results: Ct Abdomen Pelvis Wo Contrast  Result Date: 03/31/2018 CLINICAL DATA:  Generalized weakness, nausea, vomiting, hematuria EXAM: CT ABDOMEN AND PELVIS WITHOUT CONTRAST TECHNIQUE: Multidetector CT imaging of the abdomen and pelvis was performed following the standard protocol without IV contrast. COMPARISON:  None. FINDINGS: Lower chest: Minor basilar atelectasis. Cardiomegaly without pericardial or pleural effusion. Descending thoracic aorta is atherosclerotic and mildly ectatic. Degenerative changes of the spine with osteophytes. Hepatobiliary: No focal hepatic abnormality or ductal dilatation within  the limits of noncontrast imaging. Hyper attenuation in the gallbladder compatible with vicarious contrast excretion. Biliary system is nondilated. Pancreas: No significant focal abnormality by noncontrast imaging. No surrounding inflammatory process. Spleen: Normal in size without focal abnormality. Adrenals/Urinary Tract: Normal adrenal glands. Atrophic kidneys with numerous hypodense renal cysts compatible with end-stage renal disease. No renal obstruction or hydronephrosis. No hydroureter. Bladder is collapsed with mild wall thickening. Stomach/Bowel: Negative for bowel obstruction, significant dilatation, ileus, or free air. Scattered colonic diverticulosis. Normal appearing appendix containing air. No acute inflammatory process, fluid collection, or abscess. No ascites. Vascular/Lymphatic: Atherosclerosis of aorta. Minor ectasia. No significant aneurysm. No retroperitoneal hemorrhage or hematoma. No adenopathy. Reproductive:  Prostate gland unremarkable. Seminal vesicles are symmetric. High positioned right testicle as before. Other: No abdominal wall hernia or abnormality. No abdominopelvic ascites. Musculoskeletal: Degenerative changes noted. No acute osseous finding. IMPRESSION: No acute intra-abdominal or pelvic finding by noncontrast CT. Chronic renal atrophy and renal cysts related to end-stage renal disease. Vicarious contrast excretion in the gallbladder noted from recent contrast for coronary catheterization. Atherosclerosis as above Electronically Signed   By: Jerilynn Mages.  Shick M.D.   On: 03/31/2018 14:30   Ct Head Wo Contrast  Result Date: 03/31/2018 CLINICAL DATA:  Generalized weakness for a couple days. Hematuria. Unequal pupils. EXAM: CT HEAD WITHOUT CONTRAST TECHNIQUE: Contiguous axial images were obtained from the base of the skull through the vertex without intravenous contrast. COMPARISON:  Maxillofacial CT 07/22/2017.  Head CT 01/30/2008. FINDINGS: Brain: There is no evidence of acute intracranial hemorrhage, mass lesion, brain edema or extra-axial fluid collection. The ventricles and subarachnoid spaces are appropriately sized for age. There is no CT evidence of acute cortical infarction. There is patchy low-density in the periventricular white matter, likely due to chronic small vessel ischemic changes. Vascular: Intracranial vascular calcifications. No hyperdense vessel identified. Skull: Negative for fracture or focal lesion. Sinuses/Orbits: There is chronic deformity of the lamina papyracea on the left. The visualized paranasal sinuses, mastoid air cells and middle ears are clear. Interval lens surgery in the right orbit. Other: None. IMPRESSION: 1. No acute intracranial findings. 2. Old facial fractures and previous right lens dislocation. 3. Mild chronic small vessel ischemic changes in the periventricular white matter. Electronically Signed   By: Richardean Sale M.D.   On: 03/31/2018 14:26   Dg Chest Portable 1  View  Result Date: 03/31/2018 CLINICAL DATA:  Shortness of breath. EXAM: PORTABLE CHEST 1 VIEW COMPARISON:  08/07/2017 and 06/20/2017 radiographs.  CT 06/12/2017. FINDINGS: 0902 hours. There is stable cardiomegaly. The lungs are clear. There is no pleural effusion or pneumothorax. There are stable prominent nipple shadows bilaterally. No acute osseous findings are seen. Telemetry leads overlie the chest. IMPRESSION: Interval resolution of left lung opacities. Stable cardiomegaly. No acute cardiopulmonary process. Electronically Signed   By: Richardean Sale M.D.   On: 03/31/2018 09:22   Medications: . sodium chloride    . sodium chloride    . ceFEPime (MAXIPIME) IV 2 g (04/01/18 0141)  . metronidazole 500 mg (04/01/18 8119)  . sodium chloride    . vancomycin     . amLODipine  5 mg Oral Daily  . aspirin EC  81 mg Oral Daily  . atorvastatin  40 mg Oral Daily  . calcitRIOL  0.75 mcg Oral Q M,W,F  . Chlorhexidine Gluconate Cloth  6 each Topical Q0600  . [START ON 04/02/2018] cinacalcet  180 mg Oral Q M,W,F-HD  . heparin  5,000 Units Subcutaneous Q8H  . [START ON  04/02/2018] Influenza vac split quadrivalent PF  0.5 mL Intramuscular Tomorrow-1000  . lactulose  20 g Oral BID  . metoprolol succinate  50 mg Oral Daily  . [START ON 04/02/2018] pneumococcal 23 valent vaccine  0.5 mL Intramuscular Tomorrow-1000  . sodium chloride flush  3 mL Intravenous Q12H     Dialysis Orders: [facility] MWF 4 hrs 180 NRe 450/800  72 kg 2.0 K/2.0 Ca UFP 4 -Heparin 2000 units IV TIW -Sensipar 357 mg PO TIW -Calicitriol 0.17 mcg PO TIW -Mircera 150 mcg IV q 2 weeks (last dose 03/28/2018) -Venofer 50 mg IV weekly (last dose 03/28/2018)  Assessment/Plan: 1.  Sepsis: WBC 2.6 hypothermic-Temp 94.8 on adm now normal. Lactic Acid 4.16-3.83 Procalcitonin 0.40.  Vanc/Cefepime/Metronidazole per sepsis protocol.  2. Hypoglycemia-BS 21 on arrival to ED. Rec'd D50W. BS remain low.   3. Hyperkalemia-Resolved with HD.  4.   PAD-Scheduled for Fem-pop bypass grafting 04/11/18 per Dr. Donnetta Hutching.  5.  ESRD - MWF missed HD 03/30/18. Urgent HD 09/21 for hyperkalemia. HD tomorrow on schedule. Usual heparin.  6.  Hypertension/volume  - Very hypertensive on adm. Denied cocaine use, says he drank 40 oz alcohol in AM. Has not been getting to OP EDW D/T truncated treatments. CXR unremarkable. HD 09/21 Net UF 3.0 liters was able to get to OP EDW 72 kg. BP better controlled. Continue amlodipine 5 mg PO q day, metoprolol tartrate 50 mg PO BID.  7.  Anemia  - HGB 10.0 Recent ESA dose. Monitor HGB.  8.  Metabolic bone disease -Phos 5.7 Ca 8.1 C Ca 8.9. continue binders, VDRA, recheck Ca during HD.  9.  Nutrition - Albumin 3.0 Renal diet/fluid restrictions when able to eat.  10.  H/O polysubstance abuse 11.  H/O Hep. C. Has been treated.    [name] NP-C 04/01/2018, 8:55 AM  [facility] [phone]  Pt seen, examined and agree w A/P as above.  Kelly Splinter MD [employer] pager [phone]   04/01/2018, 11:01 AM

## 2018-04-01 NOTE — Progress Notes (Signed)
Patient Alert,  oriented x 4  and VSS upon arrival at the unit. Pt Oriented to the unit and fall education completed . Pt verbalized understanding how to call for assistance. Pt with dressing to left femoral cath incision site otherwise intact skin. Left arm AV fistula intact and  + for Bruit and thrill

## 2018-04-02 DIAGNOSIS — A419 Sepsis, unspecified organism: Principal | ICD-10-CM

## 2018-04-02 LAB — RENAL FUNCTION PANEL
Albumin: 3 g/dL — ABNORMAL LOW (ref 3.5–5.0)
Anion gap: 13 (ref 5–15)
BUN: 42 mg/dL — ABNORMAL HIGH (ref 8–23)
CO2: 23 mmol/L (ref 22–32)
Calcium: 8.6 mg/dL — ABNORMAL LOW (ref 8.9–10.3)
Chloride: 101 mmol/L (ref 98–111)
Creatinine, Ser: 10.88 mg/dL — ABNORMAL HIGH (ref 0.61–1.24)
GFR calc Af Amer: 5 mL/min — ABNORMAL LOW (ref >60)
GFR calc non Af Amer: 4 mL/min — ABNORMAL LOW (ref >60)
Glucose, Bld: 75 mg/dL (ref 70–99)
Phosphorus: 5.4 mg/dL — ABNORMAL HIGH (ref 2.5–4.6)
Potassium: 4.8 mmol/L (ref 3.5–5.1)
Sodium: 137 mmol/L (ref 135–145)

## 2018-04-02 LAB — CBC
HCT: 33.2 % — ABNORMAL LOW (ref 39.0–52.0)
Hemoglobin: 10.3 g/dL — ABNORMAL LOW (ref 13.0–17.0)
MCH: 33.9 pg (ref 26.0–34.0)
MCHC: 31 g/dL (ref 30.0–36.0)
MCV: 109.2 fL — ABNORMAL HIGH (ref 78.0–100.0)
Platelets: 145 10*3/uL — ABNORMAL LOW (ref 150–400)
RBC: 3.04 MIL/uL — ABNORMAL LOW (ref 4.22–5.81)
RDW: 15.3 % (ref 11.5–15.5)
WBC: 2.7 10*3/uL — ABNORMAL LOW (ref 4.0–10.5)

## 2018-04-02 LAB — GLUCOSE, CAPILLARY
GLUCOSE-CAPILLARY: 142 mg/dL — AB (ref 70–99)
GLUCOSE-CAPILLARY: 79 mg/dL (ref 70–99)
GLUCOSE-CAPILLARY: 90 mg/dL (ref 70–99)
GLUCOSE-CAPILLARY: 102 mg/dL — AB (ref 70–99)
Glucose-Capillary: 85 mg/dL (ref 70–99)
Glucose-Capillary: 86 mg/dL (ref 70–99)

## 2018-04-02 LAB — PROCALCITONIN: PROCALCITONIN: 0.45 ng/mL

## 2018-04-02 LAB — C-PEPTIDE: C PEPTIDE: 5.4 ng/mL — AB (ref 1.1–4.4)

## 2018-04-02 MED ORDER — CALCITRIOL 0.25 MCG PO CAPS
ORAL_CAPSULE | ORAL | Status: AC
  Filled 2018-04-02: qty 1

## 2018-04-02 MED ORDER — HEPARIN SODIUM (PORCINE) 1000 UNIT/ML IJ SOLN
INTRAMUSCULAR | Status: AC
  Administered 2018-04-02: 09:00:00
  Filled 2018-04-02: qty 2

## 2018-04-02 MED ORDER — SODIUM CHLORIDE 0.9 % IV SOLN: 100.0000 mL | INTRAVENOUS | Status: DC | PRN

## 2018-04-02 MED ORDER — CALCITRIOL 0.5 MCG PO CAPS
ORAL_CAPSULE | ORAL | Status: AC
  Administered 2018-04-02: 0.75 ug via ORAL
  Filled 2018-04-02: qty 1

## 2018-04-02 MED ORDER — HEPARIN SODIUM (PORCINE) 1000 UNIT/ML DIALYSIS
2000.0000 [IU] | Freq: Once | INTRAMUSCULAR | Status: AC
  Administered 2018-04-02: 2000 [IU] via INTRAVENOUS_CENTRAL
  Filled 2018-04-02: qty 2

## 2018-04-02 NOTE — Progress Notes (Signed)
@IPLOG @        PROGRESS NOTE                                                                                                                                                                                                             Patient Demographics:    Raymond [name], is a 62 y.o. male, DOB - 1956/05/13, HTD:[id number]  Admit date - 03/31/2018   Admitting Physician Patrecia Pour, MD  Outpatient Primary MD for the patient is Scot Jun, [place]  LOS - 2  Chief Complaint  Patient presents with  . Needs Dialysis  . Hypoglycemia  . Alcohol Intoxication       Brief Narrative  Raymond [name] is a 62 y.o. male with a history of ESRD, PVD, polysubstance use, HTN, and treated hepatitis C who presented to the ED with general weakness. He reported feeling diffusely weak since the previous evening, worsening and constant, feeling hot and cold, but had no localizing symptoms, he was admitted with the diagnosis of sepsis of unknown etiology with negative CT scan abdomen pelvis along with sepsis causing hypoglycemia.   Subjective:   Patient in bed, appears comfortable, denies any headache, no fever, no chest pain or pressure, no shortness of breath , no abdominal pain. No focal weakness.     Assessment  & Plan :     1.  Hypothermia, hypoglycemia in a patient with ESRD with suspected sepsis of unclear etiology.  He has no localizing symptoms, his UA likely appears dirty due to his anuric ESRD state, his hypothermia can be explained by hypoglycemia which in turn was due to excessive alcohol binging and decreased oral intake.  Will initiate diet, monitor CBGs, check peptide, TSH and random cortisol, hypothermia has resolved, his procalcitonin is stable and I doubt he has a true infection.  He will be dialyzed on 04/02/2018, for now we will stop antibiotics and monitor if stable and afebrile discharge in the morning.  2.  ESRD.  Missed dialysis.  Was counseled to be compliant.  Nephrology on board.   Dialysis per schedule.  3.  Polysubstance abuse and intermittent alcohol binging.  Consult to quit all, no signs of withdrawal will closely monitor.  He says he does not drink on a daily basis.  4.  Dyslipidemia.  On statin.  5.  PAD.  On combination of aspirin and statin for secondary prevention continue.  No acute issues.  6.  Hypertension.  Placed on metoprolol will continue to monitor.    Family  Communication  :  None  Code Status :  Full  Disposition Plan  :  med  Consults  :  Renal  Procedures  :     CT head - non acute  CT -  No acute intra-abdominal or pelvic finding by noncontrast CT. Chronic renal atrophy and renal cysts related to end-stage renal disease. Vicarious contrast excretion in the gallbladder noted from recent contrast for coronary catheterization.  DVT Prophylaxis  :   Heparin   Lab Results  Component Value Date   PLT 145 (L) 04/02/2018    Diet :  Diet Order            Diet renal with fluid restriction Fluid restriction: 1200 mL Fluid; Room service appropriate? Yes; Fluid consistency: Thin  Diet effective now               Inpatient Medications Scheduled Meds: . amLODipine  5 mg Oral Daily  . aspirin EC  81 mg Oral Daily  . atorvastatin  40 mg Oral Daily  . calcitRIOL  0.75 mcg Oral Q M,W,F  . Chlorhexidine Gluconate Cloth  6 each Topical Q0600  . cinacalcet  180 mg Oral Q M,W,F-HD  . heparin      . heparin  5,000 Units Subcutaneous Q8H  . lactulose  20 g Oral BID  . metoprolol tartrate  50 mg Oral BID  . pneumococcal 23 valent vaccine  0.5 mL Intramuscular Tomorrow-1000  . sevelamer carbonate  4,000 mg Oral TID WC  . sodium chloride flush  3 mL Intravenous Q12H   Continuous Infusions: . sodium chloride    . sodium chloride    . sodium chloride    . sodium chloride    . ceFEPime (MAXIPIME) IV Stopped (04/01/18 1034)  . metronidazole 500 mg (04/02/18 0615)  . sodium chloride    . vancomycin     PRN Meds:.sodium chloride,  sodium chloride, sodium chloride, sodium chloride, acetaminophen **OR** [DISCONTINUED] acetaminophen, albuterol, lidocaine (PF), lidocaine-prilocaine, [DISCONTINUED] ondansetron **OR** ondansetron (ZOFRAN) IV, pentafluoroprop-tetrafluoroeth  Antibiotics  :    Anti-infectives (From admission, onward)   Start     Dose/Rate Route Frequency Ordered Stop   03/31/18 2230  ceFEPIme (MAXIPIME) 2 g in sodium chloride 0.9 % 100 mL IVPB     2 g 200 mL/hr over 30 Minutes Intravenous  Once 03/31/18 2147 04/01/18 1018   03/31/18 2230  vancomycin (VANCOCIN) IVPB 750 mg/150 ml premix     750 mg 150 mL/hr over 60 Minutes Intravenous  Once 03/31/18 2149 04/01/18 1109   03/31/18 2200  metroNIDAZOLE (FLAGYL) IVPB 500 mg     500 mg 100 mL/hr over 60 Minutes Intravenous Every 8 hours 03/31/18 2144     03/31/18 1830  Vancomycin (VANCOCIN) 750-5 MG/150ML-% IVPB    Note to Pharmacy:  Yehuda Savannah   : cabinet override      03/31/18 1830 04/01/18 0644   03/31/18 1800  vancomycin (VANCOCIN) IVPB 750 mg/150 ml premix     750 mg 150 mL/hr over 60 Minutes Intravenous Every M-W-F (Hemodialysis) 03/31/18 0943     03/31/18 1800  ceFEPIme (MAXIPIME) 2 g in sodium chloride 0.9 % 100 mL IVPB     2 g 200 mL/hr over 30 Minutes Intravenous Every M-W-F (Hemodialysis) 03/31/18 0943     03/31/18 0945  vancomycin (VANCOCIN) 1,500 mg in sodium chloride 0.9 % 500 mL IVPB     1,500 mg 250 mL/hr over 120 Minutes Intravenous STAT 03/31/18 0937 03/31/18 1505  03/31/18 0945  ceFEPIme (MAXIPIME) 2 g in sodium chloride 0.9 % 100 mL IVPB     2 g 200 mL/hr over 30 Minutes Intravenous STAT 03/31/18 0937 03/31/18 1101   03/31/18 0930  ceFEPIme (MAXIPIME) 2 g in sodium chloride 0.9 % 100 mL IVPB  Status:  Discontinued     2 g 200 mL/hr over 30 Minutes Intravenous  Once 03/31/18 0922 03/31/18 0943   03/31/18 0930  metroNIDAZOLE (FLAGYL) IVPB 500 mg  Status:  Discontinued     500 mg 100 mL/hr over 60 Minutes Intravenous Every 8 hours  03/31/18 0922 03/31/18 2144   03/31/18 0930  vancomycin (VANCOCIN) IVPB 1000 mg/200 mL premix  Status:  Discontinued     1,000 mg 200 mL/hr over 60 Minutes Intravenous  Once 03/31/18 0922 03/31/18 0943          Objective:   Vitals:   04/02/18 0900 04/02/18 0930 04/02/18 1000 04/02/18 1030  BP: 117/63 134/82 131/86 (!) 168/93  Pulse: 60 70 70 67  Resp:      Temp:      TempSrc:      SpO2:      Weight:      Height:        Wt Readings from Last 3 Encounters:  04/02/18 72.5 kg  03/29/18 76.2 kg  03/27/18 73.8 kg     Intake/Output Summary (Last 24 hours) at 04/02/2018 1042 Last data filed at 04/02/2018 0500 Gross per 24 hour  Intake 527.45 ml  Output -  Net 527.45 ml     Physical Exam  Awake Alert, Oriented X 3, No new F.N deficits, Normal affect [place].AT,PERRAL Supple Neck,No JVD, No cervical lymphadenopathy appriciated.  Symmetrical Chest wall movement, Good air movement bilaterally, CTAB RRR,No Gallops, Rubs or new Murmurs, No Parasternal Heave +ve B.Sounds, Abd Soft, No tenderness, No organomegaly appriciated, No rebound - guarding or rigidity. No Cyanosis, Clubbing or edema, No new Rash or bruise , left arm dialysis graft site appears stable and clean    Data Review:    CBC Recent Labs  Lab 03/27/18 1420 03/31/18 0907 03/31/18 0918 03/31/18 1604 04/01/18 0736 04/02/18 0500  WBC 3.5 2.6*  --  3.3* 2.5* 2.7*  HGB 11.0* 11.3* 12.6* 10.0* 10.7* 10.3*  HCT 32.7* 37.2* 37.0* 32.3* 34.7* 33.2*  PLT 174 124*  --  137* 150 145*  MCV 101* 110.4*  --  107.7* 108.8* 109.2*  MCH 33.8* 33.5  --  33.3 33.5 33.9  MCHC 33.6 30.4  --  31.0 30.8 31.0  RDW 14.6 15.4  --  15.4 15.6* 15.3  LYMPHSABS 0.9 0.4*  --   --   --   --   MONOABS  --  0.1  --   --   --   --   EOSABS 0.1 0.0  --   --   --   --   BASOSABS 0.0 0.0  --   --   --   --     Chemistries  Recent Labs  Lab 03/27/18 1420 03/29/18 0802 03/31/18 0918 03/31/18 1050 03/31/18 1642 04/01/18 0551  04/02/18 0446  NA 144 141 132* 134*  --  137 137  K 6.3* 5.2* 7.0* 7.1* 3.7 4.6 4.8  CL 96 97* 101 96*  --  97* 101  CO2 25 29  --  18*  --  25 23  GLUCOSE 81 87 155* 151*  --  62* 75  BUN 57* 32* 75* 63*  --  25* 42*  CREATININE 13.23* 9.77* 15.10* 13.90*  --  7.52* 10.88*  CALCIUM 7.8* 7.6*  --  7.8*  --  8.1* 8.6*  MG  --   --   --  2.5*  --   --   --   AST  --   --   --  25  --   --   --   ALT  --   --   --  10  --   --   --   ALKPHOS  --   --   --  56  --   --   --   BILITOT  --   --   --  0.7  --   --   --    ------------------------------------------------------------------------------------------------------------------ No results for input(s): CHOL, HDL, LDLCALC, TRIG, CHOLHDL, LDLDIRECT in the last 72 hours.  Lab Results  Component Value Date   HGBA1C 4.6 (L) 07/13/2017   ------------------------------------------------------------------------------------------------------------------ Recent Labs    04/01/18 0736  TSH 2.470   ------------------------------------------------------------------------------------------------------------------ No results for input(s): VITAMINB12, FOLATE, FERRITIN, TIBC, IRON, RETICCTPCT in the last 72 hours.  Coagulation profile Recent Labs  Lab 03/31/18 0942  INR 1.09    No results for input(s): DDIMER in the last 72 hours.  Cardiac Enzymes Recent Labs  Lab 03/31/18 0907  TROPONINI <0.03   ------------------------------------------------------------------------------------------------------------------    Component Value Date/Time   BNP 1,215.6 (H) 12/05/2016 0406    Micro Results Recent Results (from the past 240 hour(s))  Blood culture (routine x 2)     Status: None (Preliminary result)   Collection Time: 03/31/18  9:30 AM  Result Value Ref Range Status   Specimen Description BLOOD RIGHT HAND  Final   Special Requests   Final    BOTTLES DRAWN AEROBIC AND ANAEROBIC Blood Culture adequate volume   Culture   Final     NO GROWTH 2 DAYS Performed at [facility], [address], [place], [place] [address]    Report Status PENDING  Incomplete  Blood culture (routine x 2)     Status: None (Preliminary result)   Collection Time: 03/31/18  9:40 AM  Result Value Ref Range Status   Specimen Description BLOOD RIGHT HAND  Final   Special Requests   Final    BOTTLES DRAWN AEROBIC AND ANAEROBIC Blood Culture adequate volume   Culture   Final    NO GROWTH 2 DAYS Performed at [facility], [place] [address], [place], [place] [address]    Report Status PENDING  Incomplete    Radiology Reports Ct Abdomen Pelvis Wo Contrast  Result Date: 03/31/2018 CLINICAL DATA:  Generalized weakness, nausea, vomiting, hematuria EXAM: CT ABDOMEN AND PELVIS WITHOUT CONTRAST TECHNIQUE: Multidetector CT imaging of the abdomen and pelvis was performed following the standard protocol without IV contrast. COMPARISON:  None. FINDINGS: Lower chest: Minor basilar atelectasis. Cardiomegaly without pericardial or pleural effusion. Descending thoracic aorta is atherosclerotic and mildly ectatic. Degenerative changes of the spine with osteophytes. Hepatobiliary: No focal hepatic abnormality or ductal dilatation within the limits of noncontrast imaging. Hyper attenuation in the gallbladder compatible with vicarious contrast excretion. Biliary system is nondilated. Pancreas: No significant focal abnormality by noncontrast imaging. No surrounding inflammatory process. Spleen: Normal in size without focal abnormality. Adrenals/Urinary Tract: Normal adrenal glands. Atrophic kidneys with numerous hypodense renal cysts compatible with end-stage renal disease. No renal obstruction or hydronephrosis. No hydroureter. Bladder is collapsed with mild wall thickening. Stomach/Bowel: Negative for bowel obstruction, significant  dilatation, ileus, or free air. Scattered colonic diverticulosis. Normal appearing appendix containing air. No acute inflammatory  process, fluid collection, or abscess. No ascites. Vascular/Lymphatic: Atherosclerosis of aorta. Minor ectasia. No significant aneurysm. No retroperitoneal hemorrhage or hematoma. No adenopathy. Reproductive: Prostate gland unremarkable. Seminal vesicles are symmetric. High positioned right testicle as before. Other: No abdominal wall hernia or abnormality. No abdominopelvic ascites. Musculoskeletal: Degenerative changes noted. No acute osseous finding. IMPRESSION: No acute intra-abdominal or pelvic finding by noncontrast CT. Chronic renal atrophy and renal cysts related to end-stage renal disease. Vicarious contrast excretion in the gallbladder noted from recent contrast for coronary catheterization. Atherosclerosis as above Electronically Signed   By: Jerilynn Mages.  Shick M.D.   On: 03/31/2018 14:30   Ct Head Wo Contrast  Result Date: 03/31/2018 CLINICAL DATA:  Generalized weakness for a couple days. Hematuria. Unequal pupils. EXAM: CT HEAD WITHOUT CONTRAST TECHNIQUE: Contiguous axial images were obtained from the base of the skull through the vertex without intravenous contrast. COMPARISON:  Maxillofacial CT 07/22/2017.  Head CT 01/30/2008. FINDINGS: Brain: There is no evidence of acute intracranial hemorrhage, mass lesion, brain edema or extra-axial fluid collection. The ventricles and subarachnoid spaces are appropriately sized for age. There is no CT evidence of acute cortical infarction. There is patchy low-density in the periventricular white matter, likely due to chronic small vessel ischemic changes. Vascular: Intracranial vascular calcifications. No hyperdense vessel identified. Skull: Negative for fracture or focal lesion. Sinuses/Orbits: There is chronic deformity of the lamina papyracea on the left. The visualized paranasal sinuses, mastoid air cells and middle ears are clear. Interval lens surgery in the right orbit. Other: None. IMPRESSION: 1. No acute intracranial findings. 2. Old facial fractures and  previous right lens dislocation. 3. Mild chronic small vessel ischemic changes in the periventricular white matter. Electronically Signed   By: Richardean Sale M.D.   On: 03/31/2018 14:26   Dg Chest Portable 1 View  Result Date: 03/31/2018 CLINICAL DATA:  Shortness of breath. EXAM: PORTABLE CHEST 1 VIEW COMPARISON:  08/07/2017 and 06/20/2017 radiographs.  CT 06/12/2017. FINDINGS: 0902 hours. There is stable cardiomegaly. The lungs are clear. There is no pleural effusion or pneumothorax. There are stable prominent nipple shadows bilaterally. No acute osseous findings are seen. Telemetry leads overlie the chest. IMPRESSION: Interval resolution of left lung opacities. Stable cardiomegaly. No acute cardiopulmonary process. Electronically Signed   By: Richardean Sale M.D.   On: 03/31/2018 09:22    Time Spent in minutes  30   Lala Lund M.D on 04/02/2018 at 10:42 AM  To page go to [email or web address] - password [facility]

## 2018-04-02 NOTE — Progress Notes (Signed)
Raymond [name] Progress Note   Subjective:  Seen on HD. Has no complaints this am.  Denies CP, SOB, N/V.   Objective Vitals:   04/01/18 0350 04/01/18 1620 04/01/18 2351 04/02/18 0437  BP: (!) 152/97  (!) 151/86 (!) 156/89  Pulse: 79  (!) 58   Resp: 13  19 13   Temp: 98.4 F (36.9 C) 98 F (36.7 C) 98.1 F (36.7 C) 98 F (36.7 C)  TempSrc: Oral Oral Oral Oral  SpO2: 98%  94%   Weight:      Height:       Physical Exam General: Well appearing male in NAD Heart: S1,S2 SR on monitor. No M/R/G Lungs: CTAB  Abdomen: Active BS. NTND Extremities: No LE edema   Dialysis Access: L AVF aneurysmal - cannulated on HD   Additional Objective Labs: Basic Metabolic Panel: Recent Labs  Lab 03/29/18 0802 03/31/18 0918 03/31/18 1050 03/31/18 1642 04/01/18 0551  NA 141 132* 134*  --  137  K 5.2* 7.0* 7.1* 3.7 4.6  CL 97* 101 96*  --  97*  CO2 29  --  18*  --  25  GLUCOSE 87 155* 151*  --  62*  BUN 32* 75* 63*  --  25*  CREATININE 9.77* 15.10* 13.90*  --  7.52*  CALCIUM 7.6*  --  7.8*  --  8.1*  PHOS  --   --   --   --  5.7*   Liver Function Tests: Recent Labs  Lab 03/31/18 1050 04/01/18 0551  AST 25  --   ALT 10  --   ALKPHOS 56  --   BILITOT 0.7  --   PROT 7.3  --   ALBUMIN 3.4* 3.0*   No results for input(s): LIPASE, AMYLASE in the last 168 hours. CBC: Recent Labs  Lab 03/27/18 1420  03/31/18 0907 03/31/18 0918 03/31/18 1604 04/01/18 0736  WBC 3.5  --  2.6*  --  3.3* 2.5*  NEUTROABS 2.0  --  2.1  --   --   --   HGB 11.0*   < > 11.3* 12.6* 10.0* 10.7*  HCT 32.7*   < > 37.2* 37.0* 32.3* 34.7*  MCV 101*  --  110.4*  --  107.7* 108.8*  PLT 174  --  124*  --  137* 150   < > = values in this interval not displayed.   Blood Culture    Component Value Date/Time   SDES BLOOD RIGHT HAND 03/31/2018 0940   SPECREQUEST  03/31/2018 0940    BOTTLES DRAWN AEROBIC AND ANAEROBIC Blood Culture adequate volume   CULT  03/31/2018 0940    NO GROWTH 2  DAYS Performed at [facility], [place] [address], [place], [place] [address]    REPTSTATUS PENDING 03/31/2018 0940    Cardiac Enzymes: Recent Labs  Lab 03/31/18 0907  TROPONINI <0.03   CBG: Recent Labs  Lab 03/31/18 0905 03/31/18 1118 04/01/18 1137 04/01/18 2006 04/02/18 0057  GLUCAP 146* 70 144* 122* 86   Iron Studies: No results for input(s): IRON, TIBC, TRANSFERRIN, FERRITIN in the last 72 hours. @lablastinr3 @ Studies/Results: Ct Abdomen Pelvis Wo Contrast  Result Date: 03/31/2018 CLINICAL DATA:  Generalized weakness, nausea, vomiting, hematuria EXAM: CT ABDOMEN AND PELVIS WITHOUT CONTRAST TECHNIQUE: Multidetector CT imaging of the abdomen and pelvis was performed following the standard protocol without IV contrast. COMPARISON:  None. FINDINGS: Lower chest: Minor basilar atelectasis. Cardiomegaly without pericardial or pleural effusion. Descending thoracic aorta is atherosclerotic  and mildly ectatic. Degenerative changes of the spine with osteophytes. Hepatobiliary: No focal hepatic abnormality or ductal dilatation within the limits of noncontrast imaging. Hyper attenuation in the gallbladder compatible with vicarious contrast excretion. Biliary system is nondilated. Pancreas: No significant focal abnormality by noncontrast imaging. No surrounding inflammatory process. Spleen: Normal in size without focal abnormality. Adrenals/Urinary Tract: Normal adrenal glands. Atrophic kidneys with numerous hypodense renal cysts compatible with end-stage renal disease. No renal obstruction or hydronephrosis. No hydroureter. Bladder is collapsed with mild wall thickening. Stomach/Bowel: Negative for bowel obstruction, significant dilatation, ileus, or free air. Scattered colonic diverticulosis. Normal appearing appendix containing air. No acute inflammatory process, fluid collection, or abscess. No ascites. Vascular/Lymphatic: Atherosclerosis of aorta. Minor ectasia. No significant aneurysm. No  retroperitoneal hemorrhage or hematoma. No adenopathy. Reproductive: Prostate gland unremarkable. Seminal vesicles are symmetric. High positioned right testicle as before. Other: No abdominal wall hernia or abnormality. No abdominopelvic ascites. Musculoskeletal: Degenerative changes noted. No acute osseous finding. IMPRESSION: No acute intra-abdominal or pelvic finding by noncontrast CT. Chronic renal atrophy and renal cysts related to end-stage renal disease. Vicarious contrast excretion in the gallbladder noted from recent contrast for coronary catheterization. Atherosclerosis as above Electronically Signed   By: Jerilynn Mages.  Shick M.D.   On: 03/31/2018 14:30   Ct Head Wo Contrast  Result Date: 03/31/2018 CLINICAL DATA:  Generalized weakness for a couple days. Hematuria. Unequal pupils. EXAM: CT HEAD WITHOUT CONTRAST TECHNIQUE: Contiguous axial images were obtained from the base of the skull through the vertex without intravenous contrast. COMPARISON:  Maxillofacial CT 07/22/2017.  Head CT 01/30/2008. FINDINGS: Brain: There is no evidence of acute intracranial hemorrhage, mass lesion, brain edema or extra-axial fluid collection. The ventricles and subarachnoid spaces are appropriately sized for age. There is no CT evidence of acute cortical infarction. There is patchy low-density in the periventricular white matter, likely due to chronic small vessel ischemic changes. Vascular: Intracranial vascular calcifications. No hyperdense vessel identified. Skull: Negative for fracture or focal lesion. Sinuses/Orbits: There is chronic deformity of the lamina papyracea on the left. The visualized paranasal sinuses, mastoid air cells and middle ears are clear. Interval lens surgery in the right orbit. Other: None. IMPRESSION: 1. No acute intracranial findings. 2. Old facial fractures and previous right lens dislocation. 3. Mild chronic small vessel ischemic changes in the periventricular white matter. Electronically Signed   By:  Richardean Sale M.D.   On: 03/31/2018 14:26   Dg Chest Portable 1 View  Result Date: 03/31/2018 CLINICAL DATA:  Shortness of breath. EXAM: PORTABLE CHEST 1 VIEW COMPARISON:  08/07/2017 and 06/20/2017 radiographs.  CT 06/12/2017. FINDINGS: 0902 hours. There is stable cardiomegaly. The lungs are clear. There is no pleural effusion or pneumothorax. There are stable prominent nipple shadows bilaterally. No acute osseous findings are seen. Telemetry leads overlie the chest. IMPRESSION: Interval resolution of left lung opacities. Stable cardiomegaly. No acute cardiopulmonary process. Electronically Signed   By: Richardean Sale M.D.   On: 03/31/2018 09:22   Medications: . sodium chloride    . sodium chloride    . sodium chloride    . sodium chloride    . ceFEPime (MAXIPIME) IV Stopped (04/01/18 1034)  . metronidazole 500 mg (04/02/18 0615)  . sodium chloride    . vancomycin     . heparin      . amLODipine  5 mg Oral Daily  . aspirin EC  81 mg Oral Daily  . atorvastatin  40 mg Oral Daily  . calcitRIOL  0.75 mcg  Oral Q M,W,F  . Chlorhexidine Gluconate Cloth  6 each Topical Q0600  . cinacalcet  180 mg Oral Q M,W,F-HD  . heparin  5,000 Units Subcutaneous Q8H  . lactulose  20 g Oral BID  . metoprolol tartrate  50 mg Oral BID  . pneumococcal 23 valent vaccine  0.5 mL Intramuscular Tomorrow-1000  . sevelamer carbonate  4,000 mg Oral TID WC  . sodium chloride flush  3 mL Intravenous Q12H     Dialysis Orders: [facility] MWF 4 hrs 180 NRe 450/800  72 kg 2.0 K/2.0 Ca UFP 4 -Heparin 2000 units IV TIW -Sensipar 818 mg PO TIW -Calicitriol 4.03 mcg PO TIW -Mircera 150 mcg IV q 2 weeks (last dose 03/28/2018) -Venofer 50 mg IV weekly (last dose 03/28/2018)  Assessment/Plan: 1. Hypothermia on admission/Sepsis protocol initiated -- Hypothermia resolved. Blood cultures NGTD. IV antibiotics per primary team  2. Hypoglycemia-Resolved. Not on insulin. Per primary  3. Hyperkalemia-Resolved with HD.  4. ESRD -   HD MWF. HD today on schedule.  5.  Hypertension/volume  -  Very ypertensive on admit. Better today. Continue amlodipine 5 mg PO q day, metoprolol tartrate 50 mg PO BID. Is now at EDW. Continue titrate volume down as tolerated.  6.  Anemia  - Hgb 10.7  Recent ESA dose. Follow  7.  Metabolic bone disease -continue binders, VDRA, recheck Ca during HD.  8.  Nutrition - Albumin 3.0 Renal diet/fluid restrictions when able to eat.  9.  H/O polysubstance abuse 10.  H/O Hep. C. Has been treated.    Lynnda Child PA-C [name] Pager [phone] 04/02/2018,8:20 AM

## 2018-04-02 NOTE — Addendum Note (Signed)
Addended by: Rose Phi on: 04/02/2018 01:46 PM   Modules accepted: Orders

## 2018-04-03 ENCOUNTER — Inpatient Hospital Stay (HOSPITAL_COMMUNITY): Payer: Medicare Other

## 2018-04-03 DIAGNOSIS — E875 Hyperkalemia: Secondary | ICD-10-CM

## 2018-04-03 LAB — GLUCOSE, CAPILLARY
GLUCOSE-CAPILLARY: 117 mg/dL — AB (ref 70–99)
Glucose-Capillary: 92 mg/dL (ref 70–99)
Glucose-Capillary: 90 mg/dL (ref 70–99)

## 2018-04-03 LAB — CBC
HEMATOCRIT: 37.8 % — AB (ref 39.0–52.0)
HEMOGLOBIN: 11.5 g/dL — AB (ref 13.0–17.0)
MCH: 33.1 pg (ref 26.0–34.0)
MCHC: 30.4 g/dL (ref 30.0–36.0)
MCV: 108.9 fL — ABNORMAL HIGH (ref 78.0–100.0)
Platelets: 157 10*3/uL (ref 150–400)
RBC: 3.47 MIL/uL — ABNORMAL LOW (ref 4.22–5.81)
RDW: 15.3 % (ref 11.5–15.5)
WBC: 3.8 10*3/uL — ABNORMAL LOW (ref 4.0–10.5)

## 2018-04-03 IMAGING — DX DG ABD PORTABLE 1V
1 series · 1 of 1 positions shown · non-contrast
Comparison: Abdominal and pelvic CT scan [DATE]

CLINICAL DATA: Bloody diarrhea and nausea 2 days ago. The patient
has dialysis dependent renal failure. COPD.

EXAM:
PORTABLE ABDOMEN - 1 VIEW

[abdomen]
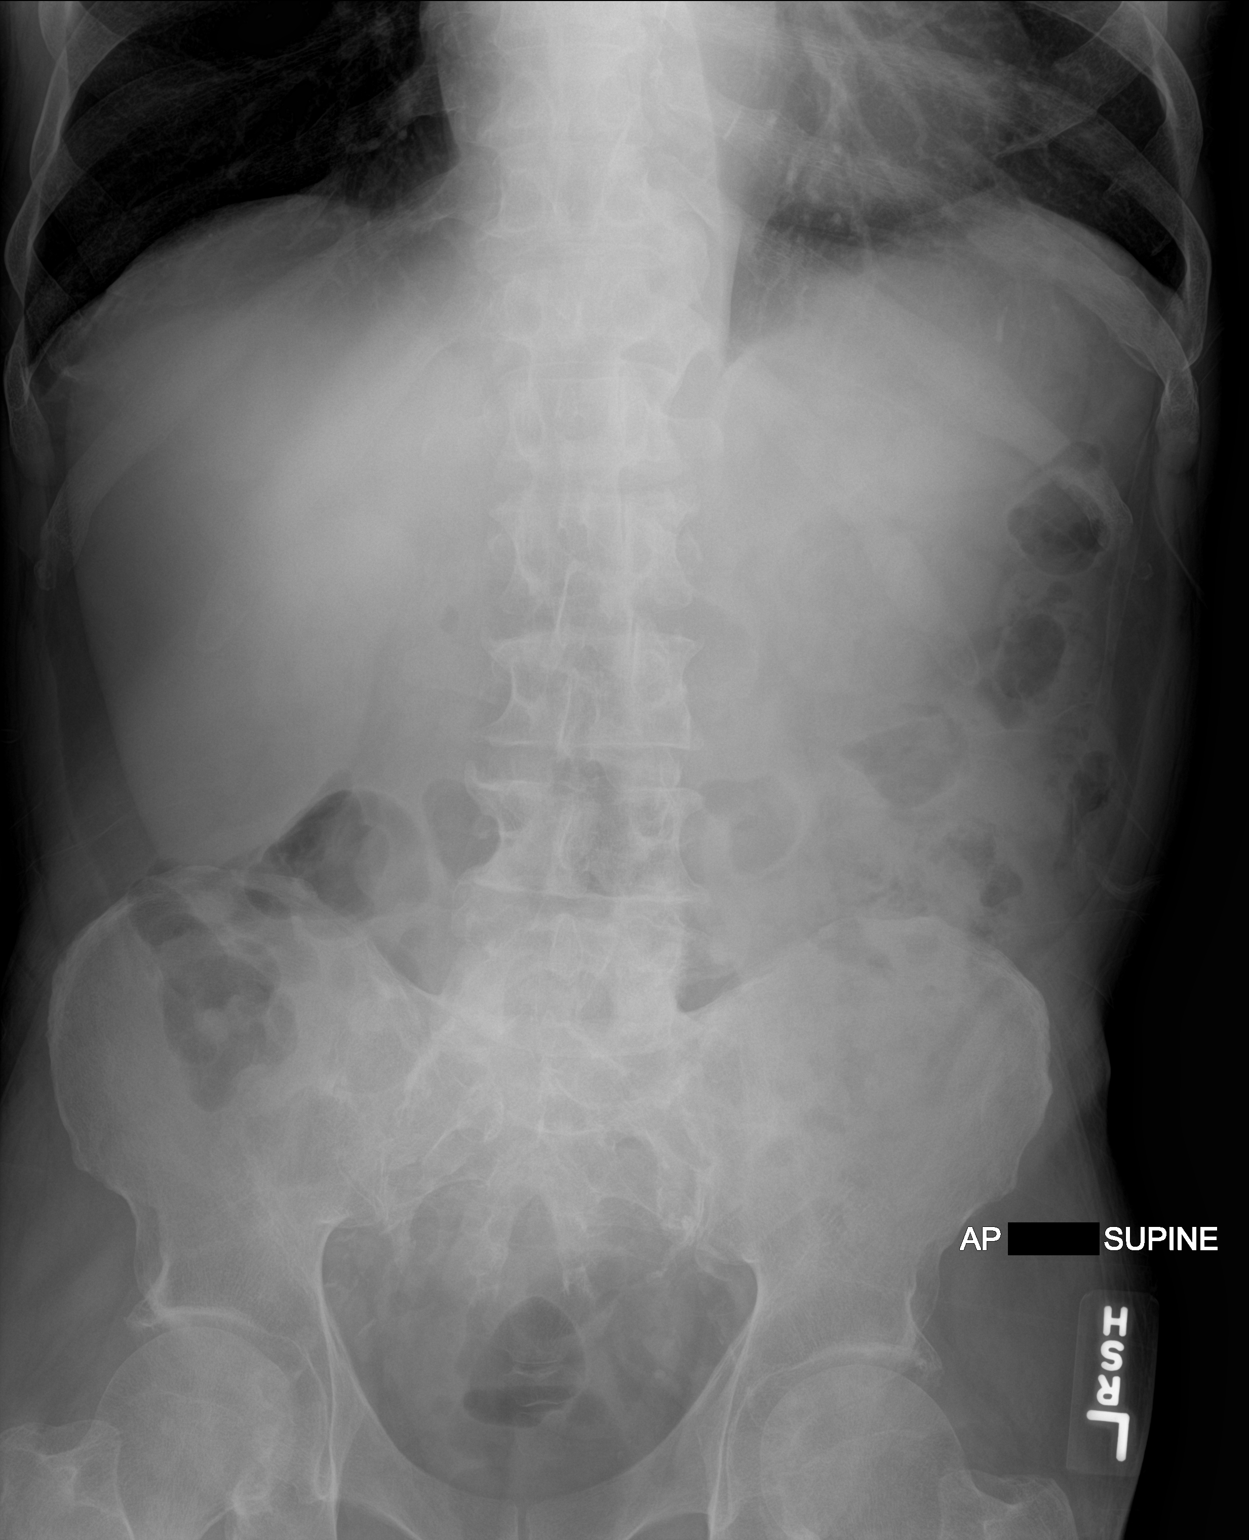

[1 of 1 positions shown; findings below may reference images not displayed]

FINDINGS: The bowel gas pattern is within the limits of normal. The colonic
stool burden is not excessive. No abnormal soft tissue
calcifications are observed. Mild lumbar spine endplate spurring is
observed.
IMPRESSION: No acute intra-abdominal abnormality is observed.

## 2018-04-03 MED ORDER — ONDANSETRON HCL 4 MG PO TABS: 4.0000 mg | ORAL_TABLET | Freq: Three times a day (TID) | ORAL | 0 refills | Status: DC | PRN

## 2018-04-03 MED ORDER — PANTOPRAZOLE SODIUM 40 MG PO TBEC: 40.0000 mg | DELAYED_RELEASE_TABLET | Freq: Every day | ORAL | 0 refills | Status: DC

## 2018-04-03 NOTE — Progress Notes (Signed)
Pt discharge to home. IV removed and intact. Patient A&Ox4. AVS given and reviewed with patient. Patient escorted out via wheel chair.

## 2018-04-03 NOTE — Progress Notes (Signed)
Raymond [name] Progress Note   Subjective:  Seen in room. Plans for discharge today Had some diarrhea last night   Objective Vitals:   04/02/18 1212 04/02/18 2143 04/03/18 0511 04/03/18 0859  BP: 138/83 (!) 163/92 128/72 (!) 145/85  Pulse: 73 72 (!) 59 63  Resp: 16 18 18    Temp: 97.6 F (36.4 C) 98.3 F (36.8 C) 97.6 F (36.4 C)   TempSrc: Oral Oral Oral   SpO2: 99% 96% 97%   Weight: 70.9 kg     Height:       Physical Exam General: Well appearing male in NAD Heart: S1,S2 SR on monitor. No M/R/G Lungs: CTAB  Abdomen: Active BS. NTND Extremities: No LE edema   Dialysis Access: L AVF aneurysmal +bruit   Additional Objective Labs: Basic Metabolic Panel: Recent Labs  Lab 03/31/18 1050 03/31/18 1642 04/01/18 0551 04/02/18 0446  NA 134*  --  137 137  K 7.1* 3.7 4.6 4.8  CL 96*  --  97* 101  CO2 18*  --  25 23  GLUCOSE 151*  --  62* 75  BUN 63*  --  25* 42*  CREATININE 13.90*  --  7.52* 10.88*  CALCIUM 7.8*  --  8.1* 8.6*  PHOS  --   --  5.7* 5.4*   Liver Function Tests: Recent Labs  Lab 03/31/18 1050 04/01/18 0551 04/02/18 0446  AST 25  --   --   ALT 10  --   --   ALKPHOS 56  --   --   BILITOT 0.7  --   --   PROT 7.3  --   --   ALBUMIN 3.4* 3.0* 3.0*   No results for input(s): LIPASE, AMYLASE in the last 168 hours. CBC: Recent Labs  Lab 03/27/18 1420  03/31/18 0907  03/31/18 1604 04/01/18 0736 04/02/18 0500 04/03/18 0451  WBC 3.5   < > 2.6*  --  3.3* 2.5* 2.7* 3.8*  NEUTROABS 2.0  --  2.1  --   --   --   --   --   HGB 11.0*  --  11.3*   < > 10.0* 10.7* 10.3* 11.5*  HCT 32.7*  --  37.2*   < > 32.3* 34.7* 33.2* 37.8*  MCV 101*  --  110.4*  --  107.7* 108.8* 109.2* 108.9*  PLT 174   < > 124*  --  137* 150 145* 157   < > = values in this interval not displayed.   Blood Culture    Component Value Date/Time   SDES BLOOD RIGHT HAND 03/31/2018 0940   SPECREQUEST  03/31/2018 0940    BOTTLES DRAWN AEROBIC AND ANAEROBIC Blood Culture  adequate volume   CULT  03/31/2018 0940    NO GROWTH 2 DAYS Performed at [facility], [place] [address] [place], [place] [address]    REPTSTATUS PENDING 03/31/2018 0940    Cardiac Enzymes: Recent Labs  Lab 03/31/18 0907  TROPONINI <0.03   CBG: Recent Labs  Lab 04/02/18 1623 04/02/18 2043 04/03/18 0114 04/03/18 0510 04/03/18 0741  GLUCAP 102* 85 117* 92 90   Iron Studies: No results for input(s): IRON, TIBC, TRANSFERRIN, FERRITIN in the last 72 hours. @lablastinr3 @ Studies/Results: Dg Abd Portable 1v  Result Date: 04/03/2018 CLINICAL DATA:  Bloody diarrhea and nausea 2 days ago. The patient has dialysis dependent renal failure. COPD. EXAM: PORTABLE ABDOMEN - 1 VIEW COMPARISON:  Abdominal and pelvic CT scan of March 31, 2018 FINDINGS: The  bowel gas pattern is within the limits of normal. The colonic stool burden is not excessive. No abnormal soft tissue calcifications are observed. Mild lumbar spine endplate spurring is observed. IMPRESSION: No acute intra-abdominal abnormality is observed. Electronically Signed   By: David  Martinique M.D.   On: 04/03/2018 08:36   Medications: . sodium chloride    . sodium chloride    . sodium chloride    . sodium chloride    . sodium chloride     . amLODipine  5 mg Oral Daily  . aspirin EC  81 mg Oral Daily  . atorvastatin  40 mg Oral Daily  . calcitRIOL  0.75 mcg Oral Q M,W,F  . Chlorhexidine Gluconate Cloth  6 each Topical Q0600  . cinacalcet  180 mg Oral Q M,W,F-HD  . heparin  5,000 Units Subcutaneous Q8H  . lactulose  20 g Oral BID  . metoprolol tartrate  50 mg Oral BID  . sevelamer carbonate  4,000 mg Oral TID WC  . sodium chloride flush  3 mL Intravenous Q12H     Dialysis Orders: [facility] MWF 4 hrs 180 NRe 450/800  72 kg 2.0 K/2.0 Ca UFP 4 -Heparin 2000 units IV TIW -Sensipar 937 mg PO TIW -Calicitriol 3.42 mcg PO TIW -Mircera 150 mcg IV q 2 weeks (last dose 03/28/2018) -Venofer 50 mg IV weekly (last dose  03/28/2018)  Assessment/Plan: 1. Hypothermia on admission/Sepsis protocol initiated -- Hypothermia resolved. Completed course of IV abx. Blood cultures NGTD. Hypoglycemia-Resolved. Not on insulin. Per primary  2. Hyperkalemia-Resolved with HD.  3. ESRD -  HD MWF. HD today on schedule.  4.  Hypertension/volume  -  Very hypertensive on admit. Now controlled. Continue amlodipine 5 mg PO q day, metoprolol tartrate 50 mg PO BID. Is now at EDW. Continue titrate volume down as tolerated. Post HD wt 70.9kg - will lower at discharge  5.  Anemia  - Hgb 10>11  Recent ESA dose. Follow  6.  Metabolic bone disease -continue binders, VDRA, recheck Ca during HD.  7.  Nutrition - Albumin 3.0 Renal diet/fluid restrictions  8.  H/O polysubstance abuse 10.  H/O Hep. C. Has been treated.     Lynnda Child PA-C [name] Pager [phone] 04/03/2018,9:15 AM

## 2018-04-03 NOTE — Progress Notes (Signed)
CSW received consult to speak with patient. Patient reported that he has to go up stairs at his niece's home where he stays and it hurts his knees and makes him not want to go to dialysis. CSW explained that the hospital is only able to provide shelter lists and other resources. CSW inquired if perhaps he could switch floors with his niece but he stated that she has bad legs too. Patient expressed understanding. He reports that he is on the list at housing authority. Patient reported that he has transportation to dialysis. He requested booties to put over his socks as he forgot his shoes.   CSW signing off.   Percell Locus Rosamary Boudreau LCSW [phone]

## 2018-04-03 NOTE — Discharge Instructions (Signed)
Follow with Primary MD Scot Jun, FNP in 7 days   Activity: As tolerated with Full fall precautions use walker/cane & assistance as needed  Disposition Home    Diet: Renal diet strict 1.5 L/day total fluid restriction.  Special Instructions: If you have smoked or chewed Tobacco  in the last 2 yrs please stop smoking, stop any regular Alcohol  and or any Recreational drug use.  On your next visit with your primary care physician please Get Medicines reviewed and adjusted.  Please request your Prim.MD to go over all Hospital Tests and Procedure/Radiological results at the follow up, please get all Hospital records sent to your Prim MD by signing hospital release before you go home.  If you experience worsening of your admission symptoms, develop shortness of breath, life threatening emergency, suicidal or homicidal thoughts you must seek medical attention immediately by calling 911 or calling your MD immediately  if symptoms less severe.  You Must read complete instructions/literature along with all the possible adverse reactions/side effects for all the Medicines you take and that have been prescribed to you. Take any new Medicines after you have completely understood and accpet all the possible adverse reactions/side effects.

## 2018-04-03 NOTE — Discharge Summary (Signed)
Raymond [name] IEP:[id number] DOB: 1956/06/13 DOA: 03/31/2018  PCP: Scot Jun, FNP  Admit date: 03/31/2018  Discharge date: 04/03/2018  Admitted From: Home   Disposition:  Home   Recommendations for Outpatient Follow-up:   Follow up with PCP in 1-2 weeks  PCP Please obtain BMP/CBC, 2 view CXR in 1week,  (see Discharge instructions)   PCP Please follow up on the following pending results:    Home Health: None   Equipment/Devices: None  Consultations: Renal Discharge Condition: Stable   CODE STATUS: Full   Diet Recommendation: Renal diet with strict 1.5 L/day total fluid restriction  Chief Complaint  Patient presents with  . Needs Dialysis  . Hypoglycemia  . Alcohol Intoxication     Brief history of present illness from the day of admission and additional interim summary    Raymond [name] a 62 y.o.malewith a history ofESRD, PVD, polysubstance use, HTN, and treated hepatitis C who presented to the ED with general weakness. He reported feeling diffusely weak since the previous evening, worsening and constant, feeling hot and cold, but had no localizing symptoms, he was admitted with the diagnosis of sepsis of unknown etiology with negative CT scan abdomen pelvis along with sepsis causing hypoglycemia.                                                                 Hospital Course    1.  Hypothermia, hypoglycemia in a patient with ESRD with suspected sepsis of unclear etiology.  He has no localizing symptoms, his UA likely appears dirty due to his anuric ESRD state, his hypothermia can be explained by hypoglycemia which in turn was due to excessive alcohol binging and decreased oral intake.  Will initiate diet, monitor CBGs, check peptide, TSH and random cortisol, hypothermia has resolved, his  procalcitonin is stable and I doubt he has a true infection.  Antibiotics were stopped on 04/02/2018 and he was dialyzed the same day, remained labile and afebrile overnight, will discharge later today if his mild nausea is better.  2.  ESRD.  Missed dialysis.  Was counseled to be compliant.  Nephrology on board.  Dialysis per schedule.  3.  Polysubstance abuse and intermittent alcohol binging.  Consult to quit all, no signs of withdrawal will closely monitor.  He says he does not drink on a daily basis.  4.  Dyslipidemia.  On statin.  5.  PAD.  On combination of aspirin and statin for secondary prevention continue.  No acute issues.  6.  Hypertension.  Placed on metoprolol will continue to monitor.  7.  Mild nausea this morning.  No emesis.  Abdominal exam benign he has no abdominal pain, check KUB, passing flatus, supportive care with Zofran.  If tolerates diet discharge home.   Discharge diagnosis  Principal Problem:   Sepsis ([facility]) Active Problems:   HYPERCHOLESTEROLEMIA   HYPERTENSION, BENIGN SYSTEMIC   Atherosclerosis of native arteries of extremity with intermittent claudication (HCC)   ESRD (end stage renal disease) ([place])   Hyperkalemia   Hypoglycemia   Polysubstance abuse [facility])    Discharge instructions    Discharge Instructions    Discharge instructions   Complete by:  As directed    Follow with Primary MD Scot Jun, FNP in 7 days   Activity: As tolerated with Full fall precautions use walker/cane & assistance as needed  Disposition Home    Diet: Renal diet strict 1.5 L/day total fluid restriction.  Special Instructions: If you have smoked or chewed Tobacco  in the last 2 yrs please stop smoking, stop any regular Alcohol  and or any Recreational drug use.  On your next visit with your primary care physician please Get Medicines reviewed and adjusted.  Please request your Prim.MD to go over all Hospital Tests and Procedure/Radiological results  at the follow up, please get all Hospital records sent to your Prim MD by signing hospital release before you go home.  If you experience worsening of your admission symptoms, develop shortness of breath, life threatening emergency, suicidal or homicidal thoughts you must seek medical attention immediately by calling 911 or calling your MD immediately  if symptoms less severe.  You Must read complete instructions/literature along with all the possible adverse reactions/side effects for all the Medicines you take and that have been prescribed to you. Take any new Medicines after you have completely understood and accpet all the possible adverse reactions/side effects.   Increase activity slowly   Complete by:  As directed       Discharge Medications   Allergies as of 04/03/2018   No Known Allergies     Medication List    TAKE these medications   acetaminophen 325 MG tablet Commonly known as:  TYLENOL Take 2 tablets (650 mg total) by mouth every 6 (six) hours as needed. What changed:  reasons to take this   albuterol 108 (90 Base) MCG/ACT inhaler Commonly known as:  PROVENTIL HFA;VENTOLIN HFA Inhale 2 puffs into the lungs every 6 (six) hours as needed for wheezing or shortness of breath.   amLODipine 5 MG tablet Commonly known as:  NORVASC Take 1 tablet (5 mg total) by mouth daily.   aspirin EC 81 MG tablet Take 81 mg by mouth daily.   atorvastatin 40 MG tablet Commonly known as:  LIPITOR Take 1 tablet (40 mg total) by mouth daily.   gabapentin 300 MG capsule Commonly known as:  NEURONTIN TAKE 1 CAPSULE BY MOUTH AT BEDTIME AS NEEDED FOR SLEEP What changed:    how much to take  how to take this  when to take this  reasons to take this  additional instructions   metoprolol succinate 25 MG 24 hr tablet Commonly known as:  TOPROL-XL Take 2 tablets (50 mg total) by mouth daily.   ondansetron 4 MG tablet Commonly known as:  ZOFRAN Take 1 tablet (4 mg total) by mouth  every 8 (eight) hours as needed for nausea or vomiting.   pantoprazole 40 MG tablet Commonly known as:  PROTONIX Take 1 tablet (40 mg total) by mouth daily.   SENSIPAR 30 MG tablet Generic drug:  cinacalcet Take 30 mg by mouth daily.   sevelamer 800 MG tablet Commonly known as:  RENAGEL Take 4,800 mg by mouth 3 (three) times daily with meals.  Follow-up Information    Scot Jun, FNP. Schedule an appointment as soon as possible for a visit in 1 week(s).   Specialty:  Family Medicine Contact information: [place] [address] [phone]        Josue Hector, MD .   Specialty:  Cardiology Contact information: [phone] [address] [place] [address] [place] [address] [phone]           Major procedures and Radiology Reports - PLEASE review detailed and final reports thoroughly  -         Ct Abdomen Pelvis Wo Contrast  Result Date: 03/31/2018 CLINICAL DATA:  Generalized weakness, nausea, vomiting, hematuria EXAM: CT ABDOMEN AND PELVIS WITHOUT CONTRAST TECHNIQUE: Multidetector CT imaging of the abdomen and pelvis was performed following the standard protocol without IV contrast. COMPARISON:  None. FINDINGS: Lower chest: Minor basilar atelectasis. Cardiomegaly without pericardial or pleural effusion. Descending thoracic aorta is atherosclerotic and mildly ectatic. Degenerative changes of the spine with osteophytes. Hepatobiliary: No focal hepatic abnormality or ductal dilatation within the limits of noncontrast imaging. Hyper attenuation in the gallbladder compatible with vicarious contrast excretion. Biliary system is nondilated. Pancreas: No significant focal abnormality by noncontrast imaging. No surrounding inflammatory process. Spleen: Normal in size without focal abnormality. Adrenals/Urinary Tract: Normal adrenal glands. Atrophic kidneys with numerous hypodense renal cysts compatible with end-stage renal disease. No renal obstruction or  hydronephrosis. No hydroureter. Bladder is collapsed with mild wall thickening. Stomach/Bowel: Negative for bowel obstruction, significant dilatation, ileus, or free air. Scattered colonic diverticulosis. Normal appearing appendix containing air. No acute inflammatory process, fluid collection, or abscess. No ascites. Vascular/Lymphatic: Atherosclerosis of aorta. Minor ectasia. No significant aneurysm. No retroperitoneal hemorrhage or hematoma. No adenopathy. Reproductive: Prostate gland unremarkable. Seminal vesicles are symmetric. High positioned right testicle as before. Other: No abdominal wall hernia or abnormality. No abdominopelvic ascites. Musculoskeletal: Degenerative changes noted. No acute osseous finding. IMPRESSION: No acute intra-abdominal or pelvic finding by noncontrast CT. Chronic renal atrophy and renal cysts related to end-stage renal disease. Vicarious contrast excretion in the gallbladder noted from recent contrast for coronary catheterization. Atherosclerosis as above Electronically Signed   By: Jerilynn Mages.  Shick M.D.   On: 03/31/2018 14:30   Ct Head Wo Contrast  Result Date: 03/31/2018 CLINICAL DATA:  Generalized weakness for a couple days. Hematuria. Unequal pupils. EXAM: CT HEAD WITHOUT CONTRAST TECHNIQUE: Contiguous axial images were obtained from the base of the skull through the vertex without intravenous contrast. COMPARISON:  Maxillofacial CT 07/22/2017.  Head CT 01/30/2008. FINDINGS: Brain: There is no evidence of acute intracranial hemorrhage, mass lesion, brain edema or extra-axial fluid collection. The ventricles and subarachnoid spaces are appropriately sized for age. There is no CT evidence of acute cortical infarction. There is patchy low-density in the periventricular white matter, likely due to chronic small vessel ischemic changes. Vascular: Intracranial vascular calcifications. No hyperdense vessel identified. Skull: Negative for fracture or focal lesion. Sinuses/Orbits: There is  chronic deformity of the lamina papyracea on the left. The visualized paranasal sinuses, mastoid air cells and middle ears are clear. Interval lens surgery in the right orbit. Other: None. IMPRESSION: 1. No acute intracranial findings. 2. Old facial fractures and previous right lens dislocation. 3. Mild chronic small vessel ischemic changes in the periventricular white matter. Electronically Signed   By: Richardean Sale M.D.   On: 03/31/2018 14:26   Dg Chest Portable 1 View  Result Date: 03/31/2018 CLINICAL DATA:  Shortness of breath. EXAM: PORTABLE CHEST 1 VIEW COMPARISON:  08/07/2017 and 06/20/2017 radiographs.  CT 06/12/2017. FINDINGS: 0902 hours. There is stable cardiomegaly. The lungs are clear. There is no pleural effusion or pneumothorax. There are stable prominent nipple shadows bilaterally. No acute osseous findings are seen. Telemetry leads overlie the chest. IMPRESSION: Interval resolution of left lung opacities. Stable cardiomegaly. No acute cardiopulmonary process. Electronically Signed   By: Richardean Sale M.D.   On: 03/31/2018 09:22    Micro Results     Recent Results (from the past 240 hour(s))  Blood culture (routine x 2)     Status: None (Preliminary result)   Collection Time: 03/31/18  9:30 AM  Result Value Ref Range Status   Specimen Description BLOOD RIGHT HAND  Final   Special Requests   Final    BOTTLES DRAWN AEROBIC AND ANAEROBIC Blood Culture adequate volume   Culture   Final    NO GROWTH 2 DAYS Performed at [facility], [place] [address], [place], [place] [address]    Report Status PENDING  Incomplete  Blood culture (routine x 2)     Status: None (Preliminary result)   Collection Time: 03/31/18  9:40 AM  Result Value Ref Range Status   Specimen Description BLOOD RIGHT HAND  Final   Special Requests   Final    BOTTLES DRAWN AEROBIC AND ANAEROBIC Blood Culture adequate volume   Culture   Final    NO GROWTH 2 DAYS Performed at [facility], [place]  [address] [place], [place] [address]    Report Status PENDING  Incomplete    Today   Subjective    Marik Deem today has no headache,no chest abdominal pain,no new weakness tingling or numbness, feels much better wants to go home today.     Objective   Blood pressure 128/72, pulse (!) 59, temperature 97.6 F (36.4 C), temperature source Oral, resp. rate 18, height 5\' 8"  (1.727 m), weight 70.9 kg, SpO2 97 %.   Intake/Output Summary (Last 24 hours) at 04/03/2018 0824 Last data filed at 04/02/2018 1600 Gross per 24 hour  Intake 0 ml  Output 1500 ml  Net -1500 ml    Exam Awake Alert, Oriented x 3, No new F.N deficits, Normal affect [place].AT,PERRAL Supple Neck,No JVD, No cervical lymphadenopathy appriciated.  Symmetrical Chest wall movement, Good air movement bilaterally, CTAB RRR,No Gallops,Rubs or new Murmurs, No Parasternal Heave +ve B.Sounds, Abd Soft, Non tender, No organomegaly appriciated, No rebound -guarding or rigidity. No Cyanosis, Clubbing or edema, No new Rash or bruise   Data Review   CBC w Diff:  Lab Results  Component Value Date   WBC 3.8 (L) 04/03/2018   HGB 11.5 (L) 04/03/2018   HGB 11.0 (L) 03/27/2018   HCT 37.8 (L) 04/03/2018   HCT 32.7 (L) 03/27/2018   PLT 157 04/03/2018   PLT 174 03/27/2018   LYMPHOPCT 15 03/31/2018   MONOPCT 5 03/31/2018   EOSPCT 1 03/31/2018   BASOPCT 0 03/31/2018    CMP:  Lab Results  Component Value Date   NA 137 04/02/2018   NA 144 03/27/2018   K 4.8 04/02/2018   CL 101 04/02/2018   CO2 23 04/02/2018   BUN 42 (H) 04/02/2018   BUN 57 (H) 03/27/2018   CREATININE 10.88 (H) 04/02/2018   PROT 7.3 03/31/2018   ALBUMIN 3.0 (L) 04/02/2018   BILITOT 0.7 03/31/2018   ALKPHOS 56 03/31/2018   AST 25 03/31/2018   ALT 10 03/31/2018  .   Total Time in preparing  paper work, data evaluation and todays exam - 35 minutes  Lala Lund M.D on 04/03/2018 at 8:24 AM  [facility]   Office  [phone]

## 2018-04-05 LAB — CULTURE, BLOOD (ROUTINE X 2)
Culture: NO GROWTH
Culture: NO GROWTH
SPECIAL REQUESTS: ADEQUATE
SPECIAL REQUESTS: ADEQUATE

## 2018-04-06 DIAGNOSIS — N2581 Secondary hyperparathyroidism of renal origin: Secondary | ICD-10-CM | POA: Diagnosis not present

## 2018-04-06 DIAGNOSIS — D509 Iron deficiency anemia, unspecified: Secondary | ICD-10-CM | POA: Diagnosis not present

## 2018-04-06 DIAGNOSIS — D631 Anemia in chronic kidney disease: Secondary | ICD-10-CM | POA: Diagnosis not present

## 2018-04-06 DIAGNOSIS — N186 End stage renal disease: Secondary | ICD-10-CM | POA: Diagnosis not present

## 2018-04-09 DIAGNOSIS — D631 Anemia in chronic kidney disease: Secondary | ICD-10-CM | POA: Diagnosis not present

## 2018-04-09 DIAGNOSIS — N186 End stage renal disease: Secondary | ICD-10-CM | POA: Diagnosis not present

## 2018-04-09 DIAGNOSIS — D509 Iron deficiency anemia, unspecified: Secondary | ICD-10-CM | POA: Diagnosis not present

## 2018-04-09 DIAGNOSIS — N2581 Secondary hyperparathyroidism of renal origin: Secondary | ICD-10-CM | POA: Diagnosis not present

## 2018-04-10 DIAGNOSIS — Z992 Dependence on renal dialysis: Secondary | ICD-10-CM | POA: Diagnosis not present

## 2018-04-10 DIAGNOSIS — N2889 Other specified disorders of kidney and ureter: Secondary | ICD-10-CM | POA: Diagnosis not present

## 2018-04-10 DIAGNOSIS — N186 End stage renal disease: Secondary | ICD-10-CM | POA: Diagnosis not present

## 2018-04-10 NOTE — Progress Notes (Signed)
I was unable to reach patient by phone.  I left  A message on voice mail.  I instructed the patient to arrive at [facility] entrance at 0815   , nothing to eat or drink after midnight.   I instructed the patient to take the following medications in the am with just enough water to get them down: Amlodipine, Atorvastatin, Gabapentin Metoprolol, Protonix; if needed Tylenol, Albuterol and bring it to the hospital with youl .I asked patient to not wear any lotions, powders, cologne, jewelry, piercing, make-up or nail polish, shower, wear clean clothes, brush teeth. I asked the patient to call [phone], in the am if there were any questions or problems.

## 2018-04-10 NOTE — Anesthesia Preprocedure Evaluation (Addendum)
Anesthesia Evaluation  Patient identified by MRN, date of birth, ID band Patient awake    Reviewed: Allergy & Precautions, NPO status , Patient's Chart, lab work & pertinent test results, reviewed documented beta blocker date and time   Airway Mallampati: II  TM Distance: >3 FB Neck ROM: Full    Dental  (+) Teeth Intact, Dental Advisory Given, Missing   Pulmonary COPD,  COPD inhaler, Current Smoker,    Pulmonary exam normal breath sounds clear to auscultation       Cardiovascular hypertension, Pt. on medications and Pt. on home beta blockers + Peripheral Vascular Disease  Normal cardiovascular exam Rhythm:Regular Rate:Normal  LHC 03/29/18: 1. Mild, non-obstructive coronary artery disease. 2. Normal left ventricular systolic function with upper normal left ventricular filling pressure.   Neuro/Psych negative neurological ROS  negative psych ROS   GI/Hepatic GERD  Medicated and Controlled,(+)     substance abuse (last use 1 month ago)  cocaine use, Hepatitis -, C  Endo/Other  negative endocrine ROS  Renal/GU ESRF and DialysisRenal disease (MWF)     Musculoskeletal negative musculoskeletal ROS (+)   Abdominal   Peds  Hematology  (+) Blood dyscrasia, anemia ,   Anesthesia Other Findings Day of surgery medications reviewed with the patient.  Reproductive/Obstetrics                            Anesthesia Physical Anesthesia Plan  ASA: IV  Anesthesia Plan: General   Post-op Pain Management:    Induction: Intravenous  PONV Risk Score and Plan: 2 and Dexamethasone, Ondansetron and Midazolam  Airway Management Planned: Oral ETT  Additional Equipment:   Intra-op Plan:   Post-operative Plan: Extubation in OR  Informed Consent: I have reviewed the patients History and Physical, chart, labs and discussed the procedure including the risks, benefits and alternatives for the proposed  anesthesia with the patient or authorized representative who has indicated his/her understanding and acceptance.   Dental advisory given  Plan Discussed with: CRNA  Anesthesia Plan Comments: (Possible Arterial line after induction.)       Anesthesia Quick Evaluation

## 2018-04-11 ENCOUNTER — Other Ambulatory Visit: Payer: Self-pay

## 2018-04-11 ENCOUNTER — Inpatient Hospital Stay (HOSPITAL_COMMUNITY)
Admission: RE | Admit: 2018-04-11 | Discharge: 2018-04-21 | DRG: 252 | Disposition: A | Discharge status: Skilled Nursing Facility | Payer: Medicare Other | Attending: Vascular Surgery | Admitting: Vascular Surgery

## 2018-04-11 ENCOUNTER — Inpatient Hospital Stay (HOSPITAL_COMMUNITY): Payer: Medicare Other | Admitting: Certified Registered Nurse Anesthetist

## 2018-04-11 ENCOUNTER — Encounter (HOSPITAL_COMMUNITY): Payer: Self-pay | Admitting: Anesthesiology

## 2018-04-11 ENCOUNTER — Encounter (HOSPITAL_COMMUNITY)
Admission: RE | Disposition: A | Discharge status: Skilled Nursing Facility | Payer: Self-pay | Source: Home / Self Care | Attending: Vascular Surgery

## 2018-04-11 DIAGNOSIS — R609 Edema, unspecified: Secondary | ICD-10-CM | POA: Diagnosis not present

## 2018-04-11 DIAGNOSIS — K5900 Constipation, unspecified: Secondary | ICD-10-CM | POA: Diagnosis not present

## 2018-04-11 DIAGNOSIS — I739 Peripheral vascular disease, unspecified: Secondary | ICD-10-CM | POA: Diagnosis present

## 2018-04-11 DIAGNOSIS — E875 Hyperkalemia: Secondary | ICD-10-CM | POA: Diagnosis not present

## 2018-04-11 DIAGNOSIS — I25110 Atherosclerotic heart disease of native coronary artery with unstable angina pectoris: Secondary | ICD-10-CM | POA: Diagnosis not present

## 2018-04-11 DIAGNOSIS — Z79899 Other long term (current) drug therapy: Secondary | ICD-10-CM

## 2018-04-11 DIAGNOSIS — I480 Paroxysmal atrial fibrillation: Secondary | ICD-10-CM | POA: Diagnosis present

## 2018-04-11 DIAGNOSIS — I120 Hypertensive chronic kidney disease with stage 5 chronic kidney disease or end stage renal disease: Secondary | ICD-10-CM | POA: Diagnosis not present

## 2018-04-11 DIAGNOSIS — I70212 Atherosclerosis of native arteries of extremities with intermittent claudication, left leg: Secondary | ICD-10-CM | POA: Diagnosis not present

## 2018-04-11 DIAGNOSIS — E8889 Other specified metabolic disorders: Secondary | ICD-10-CM | POA: Diagnosis not present

## 2018-04-11 DIAGNOSIS — E785 Hyperlipidemia, unspecified: Secondary | ICD-10-CM | POA: Diagnosis not present

## 2018-04-11 DIAGNOSIS — Z7982 Long term (current) use of aspirin: Secondary | ICD-10-CM | POA: Diagnosis not present

## 2018-04-11 DIAGNOSIS — Z992 Dependence on renal dialysis: Secondary | ICD-10-CM | POA: Diagnosis not present

## 2018-04-11 DIAGNOSIS — F17210 Nicotine dependence, cigarettes, uncomplicated: Secondary | ICD-10-CM | POA: Diagnosis present

## 2018-04-11 DIAGNOSIS — I959 Hypotension, unspecified: Secondary | ICD-10-CM | POA: Diagnosis not present

## 2018-04-11 DIAGNOSIS — E7800 Pure hypercholesterolemia, unspecified: Secondary | ICD-10-CM | POA: Diagnosis not present

## 2018-04-11 DIAGNOSIS — N2581 Secondary hyperparathyroidism of renal origin: Secondary | ICD-10-CM | POA: Diagnosis present

## 2018-04-11 DIAGNOSIS — I2510 Atherosclerotic heart disease of native coronary artery without angina pectoris: Secondary | ICD-10-CM | POA: Diagnosis present

## 2018-04-11 DIAGNOSIS — I70412 Atherosclerosis of autologous vein bypass graft(s) of the extremities with intermittent claudication, left leg: Secondary | ICD-10-CM | POA: Diagnosis not present

## 2018-04-11 DIAGNOSIS — M7989 Other specified soft tissue disorders: Secondary | ICD-10-CM | POA: Diagnosis not present

## 2018-04-11 DIAGNOSIS — Z7401 Bed confinement status: Secondary | ICD-10-CM | POA: Diagnosis not present

## 2018-04-11 DIAGNOSIS — I10 Essential (primary) hypertension: Secondary | ICD-10-CM | POA: Diagnosis not present

## 2018-04-11 DIAGNOSIS — J449 Chronic obstructive pulmonary disease, unspecified: Secondary | ICD-10-CM | POA: Diagnosis not present

## 2018-04-11 DIAGNOSIS — R000 Tachycardia, unspecified: Secondary | ICD-10-CM | POA: Diagnosis not present

## 2018-04-11 DIAGNOSIS — N186 End stage renal disease: Secondary | ICD-10-CM | POA: Diagnosis not present

## 2018-04-11 DIAGNOSIS — I7389 Other specified peripheral vascular diseases: Secondary | ICD-10-CM | POA: Diagnosis not present

## 2018-04-11 DIAGNOSIS — R5381 Other malaise: Secondary | ICD-10-CM | POA: Diagnosis not present

## 2018-04-11 DIAGNOSIS — D631 Anemia in chronic kidney disease: Secondary | ICD-10-CM | POA: Diagnosis present

## 2018-04-11 DIAGNOSIS — M2550 Pain in unspecified joint: Secondary | ICD-10-CM | POA: Diagnosis not present

## 2018-04-11 DIAGNOSIS — J441 Chronic obstructive pulmonary disease with (acute) exacerbation: Secondary | ICD-10-CM | POA: Diagnosis not present

## 2018-04-11 HISTORY — DX: Atherosclerotic heart disease of native coronary artery without angina pectoris: I25.10

## 2018-04-11 HISTORY — PX: FEMORAL-POPLITEAL BYPASS GRAFT: SHX937

## 2018-04-11 LAB — COMPREHENSIVE METABOLIC PANEL
ALT: 10 U/L (ref 0–44)
AST: 19 U/L (ref 15–41)
Albumin: 3.2 g/dL — ABNORMAL LOW (ref 3.5–5.0)
Alkaline Phosphatase: 58 U/L (ref 38–126)
Anion gap: 16 — ABNORMAL HIGH (ref 5–15)
BUN: 46 mg/dL — ABNORMAL HIGH (ref 8–23)
CHLORIDE: 94 mmol/L — AB (ref 98–111)
CO2: 29 mmol/L (ref 22–32)
Calcium: 7.7 mg/dL — ABNORMAL LOW (ref 8.9–10.3)
Creatinine, Ser: 12.09 mg/dL — ABNORMAL HIGH (ref 0.61–1.24)
GFR, EST AFRICAN AMERICAN: 4 mL/min — AB (ref >60)
GFR, EST NON AFRICAN AMERICAN: 4 mL/min — AB (ref >60)
Glucose, Bld: 73 mg/dL (ref 70–99)
Potassium: 5.5 mmol/L — ABNORMAL HIGH (ref 3.5–5.1)
Sodium: 139 mmol/L (ref 135–145)
Total Bilirubin: 0.5 mg/dL (ref 0.3–1.2)
Total Protein: 7.3 g/dL (ref 6.5–8.1)

## 2018-04-11 LAB — CBC
HCT: 38.3 % — ABNORMAL LOW (ref 39.0–52.0)
Hemoglobin: 11.4 g/dL — ABNORMAL LOW (ref 13.0–17.0)
MCH: 32.8 pg (ref 26.0–34.0)
MCHC: 29.8 g/dL — ABNORMAL LOW (ref 30.0–36.0)
MCV: 110.1 fL — AB (ref 78.0–100.0)
PLATELETS: 155 10*3/uL (ref 150–400)
RBC: 3.48 MIL/uL — ABNORMAL LOW (ref 4.22–5.81)
RDW: 15.5 % (ref 11.5–15.5)
WBC: 3.8 10*3/uL — AB (ref 4.0–10.5)

## 2018-04-11 LAB — PROTIME-INR
INR: 1.16
PROTHROMBIN TIME: 14.8 s (ref 11.4–15.2)

## 2018-04-11 LAB — SURGICAL PCR SCREEN
MRSA, PCR: NEGATIVE
Staphylococcus aureus: NEGATIVE

## 2018-04-11 LAB — TYPE AND SCREEN
ABO/RH(D): B POS
ANTIBODY SCREEN: NEGATIVE

## 2018-04-11 LAB — APTT: aPTT: 32 seconds (ref 24–36)

## 2018-04-11 SURGERY — BYPASS GRAFT FEMORAL-POPLITEAL ARTERY
Anesthesia: General | Site: Leg Lower | Laterality: Left

## 2018-04-11 MED ORDER — SODIUM CHLORIDE 0.9 % IV SOLN
INTRAVENOUS | Status: AC
  Filled 2018-04-11: qty 1.2

## 2018-04-11 MED ORDER — PANTOPRAZOLE SODIUM 40 MG PO TBEC
40.0000 mg | DELAYED_RELEASE_TABLET | Freq: Every day | ORAL | Status: DC
  Administered 2018-04-12 – 2018-04-21 (×10): 40 mg via ORAL
  Filled 2018-04-11 (×10): qty 1

## 2018-04-11 MED ORDER — EPHEDRINE SULFATE-NACL 50-0.9 MG/10ML-% IV SOSY
PREFILLED_SYRINGE | INTRAVENOUS | Status: DC | PRN
  Administered 2018-04-11: 10 mg via INTRAVENOUS
  Administered 2018-04-11: 5 mg via INTRAVENOUS

## 2018-04-11 MED ORDER — ONDANSETRON HCL 4 MG/2ML IJ SOLN
INTRAMUSCULAR | Status: DC | PRN
  Administered 2018-04-11: 4 mg via INTRAVENOUS

## 2018-04-11 MED ORDER — DEXAMETHASONE SODIUM PHOSPHATE 4 MG/ML IJ SOLN
INTRAMUSCULAR | Status: DC | PRN
  Administered 2018-04-11: 10 mg via INTRAVENOUS

## 2018-04-11 MED ORDER — PANTOPRAZOLE SODIUM 40 MG PO TBEC: 40.0000 mg | DELAYED_RELEASE_TABLET | Freq: Every day | ORAL | Status: DC

## 2018-04-11 MED ORDER — LIDOCAINE 2% (20 MG/ML) 5 ML SYRINGE
INTRAMUSCULAR | Status: DC | PRN
  Administered 2018-04-11: 80 mg via INTRAVENOUS

## 2018-04-11 MED ORDER — HEPARIN SODIUM (PORCINE) 5000 UNIT/ML IJ SOLN
5000.0000 [IU] | Freq: Three times a day (TID) | INTRAMUSCULAR | Status: DC
  Administered 2018-04-12 – 2018-04-16 (×11): 5000 [IU] via SUBCUTANEOUS
  Filled 2018-04-11 (×11): qty 1

## 2018-04-11 MED ORDER — SODIUM CHLORIDE 0.9 % IV SOLN
INTRAVENOUS | Status: DC
  Administered 2018-04-11: 09:00:00 via INTRAVENOUS

## 2018-04-11 MED ORDER — MIDAZOLAM HCL 2 MG/2ML IJ SOLN
INTRAMUSCULAR | Status: AC
  Filled 2018-04-11: qty 2

## 2018-04-11 MED ORDER — CHLORHEXIDINE GLUCONATE CLOTH 2 % EX PADS
6.0000 | MEDICATED_PAD | Freq: Every day | CUTANEOUS | Status: DC
  Administered 2018-04-15: 6 via TOPICAL

## 2018-04-11 MED ORDER — OXYCODONE-ACETAMINOPHEN 5-325 MG PO TABS
1.0000 | ORAL_TABLET | ORAL | Status: DC | PRN
  Administered 2018-04-11 – 2018-04-14 (×11): 2 via ORAL
  Administered 2018-04-14: 1 via ORAL
  Administered 2018-04-15 – 2018-04-16 (×2): 2 via ORAL
  Filled 2018-04-11 (×8): qty 2
  Filled 2018-04-11: qty 1
  Filled 2018-04-11 (×2): qty 2

## 2018-04-11 MED ORDER — ACETAMINOPHEN 325 MG PO TABS
325.0000 mg | ORAL_TABLET | ORAL | Status: DC | PRN
  Administered 2018-04-18 – 2018-04-20 (×2): 650 mg via ORAL
  Filled 2018-04-11: qty 2

## 2018-04-11 MED ORDER — LIDOCAINE 2% (20 MG/ML) 5 ML SYRINGE
INTRAMUSCULAR | Status: AC
  Filled 2018-04-11: qty 5

## 2018-04-11 MED ORDER — ROCURONIUM BROMIDE 10 MG/ML (PF) SYRINGE
PREFILLED_SYRINGE | INTRAVENOUS | Status: DC | PRN
  Administered 2018-04-11: 20 mg via INTRAVENOUS
  Administered 2018-04-11: 10 mg via INTRAVENOUS
  Administered 2018-04-11: 50 mg via INTRAVENOUS

## 2018-04-11 MED ORDER — CEFAZOLIN SODIUM-DEXTROSE 2-4 GM/100ML-% IV SOLN
2.0000 g | Freq: Three times a day (TID) | INTRAVENOUS | Status: DC
  Filled 2018-04-11 (×2): qty 100

## 2018-04-11 MED ORDER — ATORVASTATIN CALCIUM 40 MG PO TABS
40.0000 mg | ORAL_TABLET | Freq: Every day | ORAL | Status: DC
  Administered 2018-04-11 – 2018-04-21 (×11): 40 mg via ORAL
  Filled 2018-04-11 (×11): qty 1

## 2018-04-11 MED ORDER — FENTANYL CITRATE (PF) 100 MCG/2ML IJ SOLN
INTRAMUSCULAR | Status: DC | PRN
  Administered 2018-04-11: 100 ug via INTRAVENOUS
  Administered 2018-04-11 (×2): 25 ug via INTRAVENOUS
  Administered 2018-04-11 (×2): 50 ug via INTRAVENOUS

## 2018-04-11 MED ORDER — MUPIROCIN 2 % EX OINT
1.0000 "application " | TOPICAL_OINTMENT | Freq: Once | CUTANEOUS | Status: AC
  Administered 2018-04-11: 1 via TOPICAL
  Filled 2018-04-11: qty 22

## 2018-04-11 MED ORDER — ALBUMIN HUMAN 5 % IV SOLN
INTRAVENOUS | Status: DC | PRN
  Administered 2018-04-11: 12:00:00 via INTRAVENOUS

## 2018-04-11 MED ORDER — FENTANYL CITRATE (PF) 100 MCG/2ML IJ SOLN
INTRAMUSCULAR | Status: AC
  Administered 2018-04-11: 50 ug via INTRAVENOUS
  Filled 2018-04-11: qty 2

## 2018-04-11 MED ORDER — PROTAMINE SULFATE 10 MG/ML IV SOLN
INTRAVENOUS | Status: DC | PRN
  Administered 2018-04-11: 50 mg via INTRAVENOUS

## 2018-04-11 MED ORDER — SEVELAMER CARBONATE 800 MG PO TABS: 800.0000 mg | ORAL_TABLET | Freq: Three times a day (TID) | ORAL | Status: DC

## 2018-04-11 MED ORDER — LABETALOL HCL 5 MG/ML IV SOLN: 10.0000 mg | INTRAVENOUS | Status: DC | PRN

## 2018-04-11 MED ORDER — GABAPENTIN 300 MG PO CAPS
300.0000 mg | ORAL_CAPSULE | Freq: Every evening | ORAL | Status: DC | PRN
  Administered 2018-04-12 – 2018-04-20 (×4): 300 mg via ORAL
  Filled 2018-04-11 (×4): qty 1

## 2018-04-11 MED ORDER — CHLORHEXIDINE GLUCONATE 4 % EX LIQD: 60.0000 mL | Freq: Once | CUTANEOUS | Status: DC

## 2018-04-11 MED ORDER — ONDANSETRON HCL 4 MG PO TABS: 4.0000 mg | ORAL_TABLET | Freq: Three times a day (TID) | ORAL | Status: DC | PRN

## 2018-04-11 MED ORDER — HEPARIN SODIUM (PORCINE) 1000 UNIT/ML IJ SOLN
INTRAMUSCULAR | Status: AC
  Filled 2018-04-11: qty 1

## 2018-04-11 MED ORDER — PHENYLEPHRINE 40 MCG/ML (10ML) SYRINGE FOR IV PUSH (FOR BLOOD PRESSURE SUPPORT)
PREFILLED_SYRINGE | INTRAVENOUS | Status: AC
  Filled 2018-04-11: qty 10

## 2018-04-11 MED ORDER — PHENOL 1.4 % MT LIQD: 1.0000 | OROMUCOSAL | Status: DC | PRN

## 2018-04-11 MED ORDER — ONDANSETRON HCL 4 MG/2ML IJ SOLN: 4.0000 mg | Freq: Once | INTRAMUSCULAR | Status: DC | PRN

## 2018-04-11 MED ORDER — PROPOFOL 10 MG/ML IV BOLUS
INTRAVENOUS | Status: DC | PRN
  Administered 2018-04-11 (×2): 20 mg via INTRAVENOUS
  Administered 2018-04-11: 40 mg via INTRAVENOUS
  Administered 2018-04-11: 120 mg via INTRAVENOUS

## 2018-04-11 MED ORDER — SODIUM CHLORIDE 0.9 % IV SOLN
500.0000 mL | Freq: Once | INTRAVENOUS | Status: AC | PRN
  Administered 2018-04-16: 500 mL via INTRAVENOUS

## 2018-04-11 MED ORDER — ONDANSETRON HCL 4 MG/2ML IJ SOLN
INTRAMUSCULAR | Status: AC
  Filled 2018-04-11: qty 2

## 2018-04-11 MED ORDER — DOCUSATE SODIUM 100 MG PO CAPS
100.0000 mg | ORAL_CAPSULE | Freq: Every day | ORAL | Status: DC
  Administered 2018-04-12 – 2018-04-21 (×9): 100 mg via ORAL
  Filled 2018-04-11 (×10): qty 1

## 2018-04-11 MED ORDER — SODIUM CHLORIDE 0.9 % IV SOLN
INTRAVENOUS | Status: DC | PRN
  Administered 2018-04-11: 11:00:00

## 2018-04-11 MED ORDER — AMLODIPINE BESYLATE 5 MG PO TABS
5.0000 mg | ORAL_TABLET | Freq: Every day | ORAL | Status: DC
  Administered 2018-04-11 – 2018-04-12 (×2): 5 mg via ORAL
  Filled 2018-04-11 (×2): qty 1

## 2018-04-11 MED ORDER — ROCURONIUM BROMIDE 50 MG/5ML IV SOSY
PREFILLED_SYRINGE | INTRAVENOUS | Status: AC
  Filled 2018-04-11: qty 5

## 2018-04-11 MED ORDER — HEPARIN SODIUM (PORCINE) 1000 UNIT/ML IJ SOLN
INTRAMUSCULAR | Status: DC | PRN
  Administered 2018-04-11: 8000 [IU] via INTRAVENOUS
  Administered 2018-04-11: 2000 [IU] via INTRAVENOUS

## 2018-04-11 MED ORDER — MORPHINE SULFATE (PF) 2 MG/ML IV SOLN
2.0000 mg | INTRAVENOUS | Status: DC | PRN
  Administered 2018-04-11 – 2018-04-14 (×4): 2 mg via INTRAVENOUS
  Administered 2018-04-15: 1 mg via INTRAVENOUS
  Filled 2018-04-11 (×4): qty 1

## 2018-04-11 MED ORDER — MAGNESIUM SULFATE 2 GM/50ML IV SOLN: 2.0000 g | Freq: Every day | INTRAVENOUS | Status: DC | PRN

## 2018-04-11 MED ORDER — ALUM & MAG HYDROXIDE-SIMETH 200-200-20 MG/5ML PO SUSP: 15.0000 mL | ORAL | Status: DC | PRN

## 2018-04-11 MED ORDER — SODIUM CHLORIDE 0.9 % IV SOLN
INTRAVENOUS | Status: DC | PRN
  Administered 2018-04-11: 15 ug/min via INTRAVENOUS

## 2018-04-11 MED ORDER — PHENYLEPHRINE 40 MCG/ML (10ML) SYRINGE FOR IV PUSH (FOR BLOOD PRESSURE SUPPORT)
PREFILLED_SYRINGE | INTRAVENOUS | Status: DC | PRN
  Administered 2018-04-11 (×3): 80 ug via INTRAVENOUS

## 2018-04-11 MED ORDER — ONDANSETRON HCL 4 MG/2ML IJ SOLN: 4.0000 mg | Freq: Four times a day (QID) | INTRAMUSCULAR | Status: DC | PRN

## 2018-04-11 MED ORDER — POTASSIUM CHLORIDE CRYS ER 20 MEQ PO TBCR: 20.0000 meq | EXTENDED_RELEASE_TABLET | Freq: Every day | ORAL | Status: DC | PRN

## 2018-04-11 MED ORDER — EPHEDRINE 5 MG/ML INJ
INTRAVENOUS | Status: AC
  Filled 2018-04-11: qty 10

## 2018-04-11 MED ORDER — PROTAMINE SULFATE 10 MG/ML IV SOLN
INTRAVENOUS | Status: AC
  Filled 2018-04-11: qty 5

## 2018-04-11 MED ORDER — CINACALCET HCL 30 MG PO TABS: 30.0000 mg | ORAL_TABLET | Freq: Every day | ORAL | Status: DC

## 2018-04-11 MED ORDER — POLYETHYLENE GLYCOL 3350 17 G PO PACK
17.0000 g | PACK | Freq: Every day | ORAL | Status: DC | PRN
  Filled 2018-04-11: qty 1

## 2018-04-11 MED ORDER — FENTANYL CITRATE (PF) 250 MCG/5ML IJ SOLN
INTRAMUSCULAR | Status: AC
  Filled 2018-04-11: qty 5

## 2018-04-11 MED ORDER — ACETAMINOPHEN 325 MG RE SUPP: 325.0000 mg | RECTAL | Status: DC | PRN

## 2018-04-11 MED ORDER — GUAIFENESIN-DM 100-10 MG/5ML PO SYRP: 15.0000 mL | ORAL_SOLUTION | ORAL | Status: DC | PRN

## 2018-04-11 MED ORDER — CEFAZOLIN SODIUM-DEXTROSE 2-4 GM/100ML-% IV SOLN
2.0000 g | INTRAVENOUS | Status: AC
  Administered 2018-04-11: 2 g via INTRAVENOUS
  Filled 2018-04-11: qty 100

## 2018-04-11 MED ORDER — BISACODYL 10 MG RE SUPP: 10.0000 mg | Freq: Every day | RECTAL | Status: DC | PRN

## 2018-04-11 MED ORDER — ALBUTEROL SULFATE (2.5 MG/3ML) 0.083% IN NEBU: 3.0000 mL | INHALATION_SOLUTION | Freq: Four times a day (QID) | RESPIRATORY_TRACT | Status: DC | PRN

## 2018-04-11 MED ORDER — OXYCODONE-ACETAMINOPHEN 5-325 MG PO TABS
ORAL_TABLET | ORAL | Status: AC
  Administered 2018-04-12: 2 via ORAL
  Filled 2018-04-11: qty 2

## 2018-04-11 MED ORDER — METOPROLOL SUCCINATE ER 50 MG PO TB24
50.0000 mg | ORAL_TABLET | Freq: Every day | ORAL | Status: DC
  Administered 2018-04-12: 50 mg via ORAL
  Filled 2018-04-11: qty 1

## 2018-04-11 MED ORDER — FENTANYL CITRATE (PF) 100 MCG/2ML IJ SOLN
25.0000 ug | INTRAMUSCULAR | Status: DC | PRN
  Administered 2018-04-11 (×3): 50 ug via INTRAVENOUS

## 2018-04-11 MED ORDER — HYDRALAZINE HCL 20 MG/ML IJ SOLN: 5.0000 mg | INTRAMUSCULAR | Status: DC | PRN

## 2018-04-11 MED ORDER — 0.9 % SODIUM CHLORIDE (POUR BTL) OPTIME
TOPICAL | Status: DC | PRN
  Administered 2018-04-11: 1000 mL

## 2018-04-11 MED ORDER — CEFAZOLIN SODIUM-DEXTROSE 2-4 GM/100ML-% IV SOLN
2.0000 g | Freq: Once | INTRAVENOUS | Status: AC
  Administered 2018-04-12: 2 g via INTRAVENOUS
  Filled 2018-04-11: qty 100

## 2018-04-11 MED ORDER — METOPROLOL TARTRATE 5 MG/5ML IV SOLN: 2.0000 mg | INTRAVENOUS | Status: DC | PRN

## 2018-04-11 MED ORDER — ASPIRIN EC 81 MG PO TBEC
81.0000 mg | DELAYED_RELEASE_TABLET | Freq: Every day | ORAL | Status: DC
  Administered 2018-04-12 – 2018-04-19 (×8): 81 mg via ORAL
  Filled 2018-04-11 (×8): qty 1

## 2018-04-11 SURGICAL SUPPLY — 63 items
ADH SKN CLS APL DERMABOND .7 (GAUZE/BANDAGES/DRESSINGS) ×1
BANDAGE ESMARK 6X9 LF (GAUZE/BANDAGES/DRESSINGS) IMPLANT
BNDG CMPR 9X6 STRL LF SNTH (GAUZE/BANDAGES/DRESSINGS) ×1
BNDG ESMARK 6X9 LF (GAUZE/BANDAGES/DRESSINGS) ×3
CANISTER SUCT 3000ML PPV (MISCELLANEOUS) ×3 IMPLANT
CANNULA VESSEL 3MM 2 BLNT TIP (CANNULA) ×6 IMPLANT
CLIP LIGATING EXTRA MED SLVR (CLIP) ×3 IMPLANT
CLIP LIGATING EXTRA SM BLUE (MISCELLANEOUS) ×3 IMPLANT
COVER WAND RF STERILE (DRAPES) ×3 IMPLANT
CUFF TOURNIQUET SINGLE 24IN (TOURNIQUET CUFF) ×2 IMPLANT
CUFF TOURNIQUET SINGLE 34IN LL (TOURNIQUET CUFF) IMPLANT
CUFF TOURNIQUET SINGLE 44IN (TOURNIQUET CUFF) IMPLANT
DERMABOND ADVANCED (GAUZE/BANDAGES/DRESSINGS) ×2
DERMABOND ADVANCED .7 DNX12 (GAUZE/BANDAGES/DRESSINGS) ×1 IMPLANT
DRAIN SNY 10X20 3/4 PERF (WOUND CARE) IMPLANT
DRAPE CAMERA VIDEO/LASER (DRAPES) ×2 IMPLANT
DRAPE HALF SHEET 40X57 (DRAPES) IMPLANT
DRAPE X-RAY CASS 24X20 (DRAPES) IMPLANT
ELECT REM PT RETURN 9FT ADLT (ELECTROSURGICAL) ×3
ELECTRODE REM PT RTRN 9FT ADLT (ELECTROSURGICAL) ×1 IMPLANT
EVACUATOR SILICONE 100CC (DRAIN) IMPLANT
GLOVE BIO SURGEON STRL SZ 6.5 (GLOVE) ×5 IMPLANT
GLOVE BIO SURGEON STRL SZ7.5 (GLOVE) ×2 IMPLANT
GLOVE BIO SURGEONS STRL SZ 6.5 (GLOVE) ×5
GLOVE BIOGEL PI IND STRL 6.5 (GLOVE) IMPLANT
GLOVE BIOGEL PI IND STRL 8 (GLOVE) IMPLANT
GLOVE BIOGEL PI INDICATOR 6.5 (GLOVE) ×6
GLOVE BIOGEL PI INDICATOR 8 (GLOVE) ×2
GLOVE SS BIOGEL STRL SZ 7.5 (GLOVE) ×1 IMPLANT
GLOVE SUPERSENSE BIOGEL SZ 7.5 (GLOVE) ×4
GOWN STRL REUS W/ TWL LRG LVL3 (GOWN DISPOSABLE) ×3 IMPLANT
GOWN STRL REUS W/TWL LRG LVL3 (GOWN DISPOSABLE) ×9
INSERT FOGARTY SM (MISCELLANEOUS) IMPLANT
KIT BASIN OR (CUSTOM PROCEDURE TRAY) ×3 IMPLANT
KIT TURNOVER KIT B (KITS) ×3 IMPLANT
NS IRRIG 1000ML POUR BTL (IV SOLUTION) ×6 IMPLANT
PACK PERIPHERAL VASCULAR (CUSTOM PROCEDURE TRAY) ×3 IMPLANT
PAD ARMBOARD 7.5X6 YLW CONV (MISCELLANEOUS) ×6 IMPLANT
PADDING CAST ABS 6INX4YD NS (CAST SUPPLIES) ×2
PADDING CAST ABS COTTON 6X4 NS (CAST SUPPLIES) IMPLANT
PADDING CAST COTTON 6X4 STRL (CAST SUPPLIES) IMPLANT
SET COLLECT BLD 21X3/4 12 (NEEDLE) IMPLANT
SPONGE LAP 18X18 RF (DISPOSABLE) ×2 IMPLANT
STOPCOCK 4 WAY LG BORE MALE ST (IV SETS) IMPLANT
SUT ETHILON 3 0 PS 1 (SUTURE) IMPLANT
SUT PROLENE 5 0 C 1 24 (SUTURE) ×5 IMPLANT
SUT PROLENE 6 0 CC (SUTURE) ×17 IMPLANT
SUT SILK 2 0 SH (SUTURE) ×3 IMPLANT
SUT SILK 3 0 (SUTURE) ×3
SUT SILK 3-0 18XBRD TIE 12 (SUTURE) IMPLANT
SUT SILK 4 0 (SUTURE) ×6
SUT SILK 4-0 18XBRD TIE 12 (SUTURE) IMPLANT
SUT VIC AB 2-0 CT1 27 (SUTURE) ×3
SUT VIC AB 2-0 CT1 TAPERPNT 27 (SUTURE) IMPLANT
SUT VIC AB 2-0 CTX 36 (SUTURE) ×4 IMPLANT
SUT VIC AB 3-0 SH 27 (SUTURE) ×12
SUT VIC AB 3-0 SH 27X BRD (SUTURE) ×2 IMPLANT
TOWEL GREEN STERILE (TOWEL DISPOSABLE) ×3 IMPLANT
TOWEL GREEN STERILE FF (TOWEL DISPOSABLE) ×2 IMPLANT
TRAY FOLEY MTR SLVR 16FR STAT (SET/KITS/TRAYS/PACK) ×3 IMPLANT
TUBING EXTENTION W/L.L. (IV SETS) IMPLANT
UNDERPAD 30X30 (UNDERPADS AND DIAPERS) ×3 IMPLANT
WATER STERILE IRR 1000ML POUR (IV SOLUTION) ×3 IMPLANT

## 2018-04-11 NOTE — Progress Notes (Signed)
PHARMACY NOTE:  ANTIMICROBIAL RENAL DOSAGE ADJUSTMENT  Current antimicrobial regimen includes a mismatch between antimicrobial dosage and estimated renal function.  As per policy approved by the Pharmacy & Therapeutics and Medical Executive Committees, the antimicrobial dosage will be adjusted accordingly.  Current antimicrobial dosage:  Ancef 2g IV q6h x2  Indication: Surgical prophylaxis  Renal Function:  Estimated Creatinine Clearance: 6 mL/min (A) (by C-G formula based on SCr of 12.09 mg/dL (H)). [x]      On intermittent HD, scheduled: []      On CRRT    Antimicrobial dosage has been changed to:  Ancef 2g IV x1 tomorrow morning  Additional comments: Pt received pre-op dose of Ancef for prophylaxis, given ESRD pt will only need q24h dosing and one additional dose will provide 48hr of surgical prophylaxis total.   Thank you for allowing pharmacy to be a part of this patient's care.  Arrie Senate, PharmD, BCPS Clinical Pharmacist [phone] Please check AMION for all [place] numbers 04/11/2018

## 2018-04-11 NOTE — Op Note (Signed)
    OPERATIVE REPORT  DATE OF SURGERY: 04/11/2018  PATIENT: Raymond [name], 62 y.o. male MRN: [id number]  DOB: Dec 05, 1955  PRE-OPERATIVE DIAGNOSIS: Limiting claudication left lower extremity  POST-OPERATIVE DIAGNOSIS:  Same  PROCEDURE: Left femoral to below-knee popliteal bypass with reverse great saphenous vein  SURGEON:  Curt Jews, M.D.  PHYSICIAN ASSISTANT: Dr. Fortunato Curling, Liana Crocker, PA-C  ANESTHESIA: General  EBL: per anesthesia record  Total I/O In: 0814 [I.V.:800; IV Piggyback:250] Out: 200 [Blood:200]  BLOOD ADMINISTERED: none  DRAINS: none  SPECIMEN: none  COUNTS CORRECT:  YES  PATIENT DISPOSITION:  PACU - hemodynamically stable  PROCEDURE DETAILS: Patient was taken to the operative placed supine position where the area of the left groin left leg were prepped and draped in usual sterile fashion.  Incision was made over the femoral pulse and carried and isolate the common femoral artery.  There was calcified but had excellent pulse.  Patient had a large anterior accessory saphenous branch and also had large caliber's of saphenous vein.  The vein was harvested from the groin to the mid calf leaving several skin bridges.  The vein did have some varicosities throughout its course but was patent throughout its course.  The vein was ligated distally and divided.  The vein was ligated at the saphenofemoral junction and divided.  The vein was cannulated and was gently dilated.  There was one area with a large varix and this was plicated with 6-0 Prolene suture.  The below-knee popliteal artery was exposed to the same vein harvest incision.  The artery was a very calcified.  A tunnel was created from the level of the below-knee popliteal to the groin.  The common femoral artery was occluded proximally distally after giving 8000 units of intravenous heparin.  The artery was opened with an 11 blade sent lost any Potts scissors.  The vein was reversed and was spatulated and sewn  end-to-side to the artery with a running 6-0 Prolene suture.  The anastomosis was tested and found to be adequate.  The vein was then brought down to the below-knee popliteal artery.  A pneumatic tourniquet was placed in the above-knee position.  The leg was elevated exsanguinated and the pneumatic tourniquet was inflated.  The artery was opened and was extremely calcified and did require some endarterectomy.  The vein was cut to the appropriate length and was spatulated and sewn end-to-side to the artery with a running 6-0 Prolene suture.  Prior to completion of the closure a 2-1/2 dilator passed with no resistance to the distal anastomosis.  The tourniquet was deflated and after the usual flushing maneuvers the anastomosis was completed the patient had excellent graft dependent dorsalis pedis pulse signal at the foot.  She was given 50 mg of protamine to reverse heparin.  Wounds irrigated with saline.  Hemostasis talus cautery.  The wounds were closed with 2-0 Vicryl in the subcutaneous fascia and the groin and the skin was closed with 3 oh sub-particular Vicryl.  The vein harvest incisions were closed with 3 oh sub-particular Vicryl sutures.  Sterile dressing was applied the patient was transferred to the recovery room in stable condition   Rosetta Posner, M.D., [facility] 04/11/2018 6:22 PM

## 2018-04-11 NOTE — Transfer of Care (Signed)
Immediate Anesthesia Transfer of Care Note  Patient: Raymond [name]  Procedure(s) Performed: BYPASS GRAFT FEMORAL-POPLITEAL ARTERY LEFT LEG (Left Leg Lower)  Patient Location: PACU  Anesthesia Type:General  Level of Consciousness: awake and alert   Airway & Oxygen Therapy: Patient Spontanous Breathing and Patient connected to face mask oxygen  Post-op Assessment: Report given to RN and Post -op Vital signs reviewed and stable  Post vital signs: Reviewed and stable  Last Vitals:  Vitals Value Taken Time  BP 136/74 04/11/2018  3:58 PM  Temp    Pulse 79 04/11/2018  4:03 PM  Resp 17 04/11/2018  4:03 PM  SpO2 100 % 04/11/2018  4:03 PM  Vitals shown include unvalidated device data.  Last Pain:  Vitals:   04/11/18 0825  TempSrc:   PainSc: 0-No pain      Patients Stated Pain Goal: 3 (86/38/17 7116)  Complications: No apparent anesthesia complications

## 2018-04-11 NOTE — Anesthesia Procedure Notes (Signed)
Procedure Name: Intubation Date/Time: 04/11/2018 10:52 AM Performed by: Lieutenant Diego, CRNA Pre-anesthesia Checklist: Patient identified, Emergency Drugs available, Suction available and Patient being monitored Patient Re-evaluated:Patient Re-evaluated prior to induction Oxygen Delivery Method: Circle system utilized Preoxygenation: Pre-oxygenation with 100% oxygen Induction Type: IV induction Ventilation: Mask ventilation without difficulty Laryngoscope Size: Miller and 2 Grade View: Grade I Tube type: Oral Tube size: 7.5 mm Number of attempts: 1 Airway Equipment and Method: Stylet and Oral airway Placement Confirmation: ETT inserted through vocal cords under direct vision,  positive ETCO2 and breath sounds checked- equal and bilateral Secured at: 23 cm Tube secured with: Tape Dental Injury: Teeth and Oropharynx as per pre-operative assessment

## 2018-04-11 NOTE — H&P (Signed)
Office Visit   01/30/2018 [facility] -Leland Johns, Kristen Loader, MD  Vascular Surgery   PVD (peripheral vascular disease) [facility])  Dx   New Patient (Initial Visit)   ; Referred by Bing Neighbors, FNP  Reason for Visit   Additional Documentation   Vitals:   BP 133/88 (BP Location: Right Arm, Patient Position: Sitting, Cuff Size: Normal)   Pulse 85   Temp 97.6 F (36.4 C) (Oral)   Resp 20   Ht 5\' 8"  (1.727 m)   Wt 71.4 kg   SpO2 92%   BMI 23.95 kg/m   BSA 1.85 m     More Vitals   Flowsheets:   Clinical Intake,   MEWS Score,   Anthropometrics,   Vital Signs,   Healthcare Directives     Encounter Info:   Billing Info,   History,   Allergies,   Detailed Report     All Notes   Progress Notes by Larina Earthly, MD at 01/30/2018 10:00 AM  Author: Larina Earthly, MD Author Type: Physician Filed: 01/30/2018 11:32 AM  Note Status: Signed Cosign: Cosign Not Required Encounter Date: 01/30/2018  Editor: Larina Earthly, MD (Physician)                                        [facility]  Patient name: Raymond [name]     MRN: [id number]        DOB: [birth year]          Sex: male  REASON FOR CONSULT: Left leg limiting claudication  HPI: LYNK LABERGE is a 62 y.o. male, who is here today for evaluation of limiting claudication in his left leg.  He reports that he has to walk for transportation and is unable to do his routine activities.  He reports calf claudication with very limited walking and this is completely relieved by rest.  He has no history of arterial rest pain or nonhealing ulceration in his lower extremities.  He denies any claudication in his right leg.  He does report numbness in both feet that occurs with and without walking.  I explained that this is not related to arterial insufficiency.  He is on hemodialysis and has been so for greater than 5 years.  He currently dialyzes via a left arm AV fistula.       Past Medical History:  Diagnosis Date  . Anemia   . COPD (chronic obstructive pulmonary disease) (HCC)   . Dyspnea    "with too much fluid"  . ESRD (end stage renal disease) on dialysis [facility])    "MWF; [facility]" (07/22/17)  . Hemodialysis patient (HCC)   . Hepatitis C    Still positive s/p liver biopsy at [facility]  and interferon therapy for 6 months. Most recent lab work was on 10/24/12  . Hepatitis C    "took the tx; gone now" (12/05/2016)  Was treated  . History of blood transfusion ~ 2012/2013   "related to my kidneys; blood was low"  . Hypertension   . Substance abuse (HCC)   . Thyroid disease          Family History  Problem Relation Age of Onset  . Diabetes Father   . Hypertension Father     SOCIAL HISTORY: Social History        Socioeconomic  History  . Marital status: Single    Spouse name: Not on file  . Number of children: Not on file  . Years of education: Not on file  . Highest education level: Not on file  Occupational History  . Not on file  Social Needs  . Financial resource strain: Not on file  . Food insecurity:    Worry: Not on file    Inability: Not on file  . Transportation needs:    Medical: Not on file    Non-medical: Not on file  Tobacco Use  . Smoking status: Current Every Day Smoker    Packs/day: 1.00    Years: 25.00    Pack years: 25.00    Types: Cigarettes    Last attempt to quit: 08/01/2011    Years since quitting: 6.5  . Smokeless tobacco: Never Used  Substance and Sexual Activity  . Alcohol use: Yes    Comment: occasionally  . Drug use: Yes    Types: Cocaine    Comment: clean x approx 2 months (07/22/17)  . Sexual activity: Not on file  Lifestyle  . Physical activity:    Days per week: Not on file    Minutes per session: Not on file  . Stress: Not on file  Relationships  . Social connections:    Talks on phone: Not on file    Gets together: Not on file     Attends religious service: Not on file    Active member of club or organization: Not on file    Attends meetings of clubs or organizations: Not on file    Relationship status: Not on file  . Intimate partner violence:    Fear of current or ex partner: Not on file    Emotionally abused: Not on file    Physically abused: Not on file    Forced sexual activity: Not on file  Other Topics Concern  . Not on file  Social History Narrative  . Not on file    No Known Allergies        Current Outpatient Medications  Medication Sig Dispense Refill  . acetaminophen (TYLENOL) 325 MG tablet Take 2 tablets (650 mg total) by mouth every 6 (six) hours as needed. 30 tablet 0  . amLODipine (NORVASC) 5 MG tablet Take 1 tablet (5 mg total) by mouth daily. 90 tablet 1  . gabapentin (NEURONTIN) 300 MG capsule TAKE 1 CAPSULE BY MOUTH AT BEDTIME AS NEEDED FOR SLEEP 90 capsule 3  . metoprolol succinate (TOPROL-XL) 25 MG 24 hr tablet Take 2 tablets (50 mg total) by mouth daily. 120 tablet 1  . Multiple Vitamin (MULTIVITAMIN WITH MINERALS) TABS tablet Take 1 tablet by mouth daily. 30 tablet 2  . ofloxacin (OCUFLOX) 0.3 % ophthalmic solution Place 1 drop into the right eye 4 (four) times daily.  0  . sevelamer (RENAGEL) 800 MG tablet Take 800 mg by mouth 3 (three) times daily with meals.    . thiamine (VITAMIN B-1) 100 MG tablet Take 100 mg by mouth 3 (three) times a week.     . timolol (TIMOPTIC) 0.5 % ophthalmic solution Place 1 drop into the right eye 2 (two) times daily.  0  . albuterol (PROVENTIL HFA;VENTOLIN HFA) 108 (90 Base) MCG/ACT inhaler Inhale 2 puffs into the lungs every 6 (six) hours as needed for wheezing or shortness of breath. (Patient not taking: Reported on 09/12/2017) 1 Inhaler 2  . cyclopentolate (CYCLODRYL,CYCLOGYL) 1 % ophthalmic solution Place 1 drop  into the right eye 2 (two) times daily. (Patient not taking: Reported on 09/12/2017) 5 mL 0  . prednisoLONE acetate (PRED  FORTE) 1 % ophthalmic suspension Place 1 drop into the right eye 4 (four) times daily. (Patient not taking: Reported on 09/12/2017) 5 mL 0   No current facility-administered medications for this visit.             Facility-Administered Medications Ordered in Other Visits  Medication Dose Route Frequency Provider Last Rate Last Dose  . midazolam (VERSED) 5 MG/5ML injection    Anesthesia Intra-op Sonda Primes, CRNA   1 mg at 05/17/17 1631    REVIEW OF SYSTEMS:  [X]  denotes positive finding, [ ]  denotes negative finding Cardiac  Comments:  Chest pain or chest pressure:    Shortness of breath upon exertion: x   Short of breath when lying flat:    Irregular heart rhythm:        Vascular    Pain in calf, thigh, or hip brought on by ambulation: x   Pain in feet at night that wakes you up from your sleep:     Blood clot in your veins:    Leg swelling:  x       Pulmonary    Oxygen at home:    Productive cough:     Wheezing:         Neurologic    Sudden weakness in arms or legs:     Sudden numbness in arms or legs:     Sudden onset of difficulty speaking or slurred speech:    Temporary loss of vision in one eye:  x   Problems with dizziness:         Gastrointestinal    Blood in stool:     Vomited blood:         Genitourinary    Burning when urinating:  x   Blood in urine:        Psychiatric    Major depression:         Hematologic    Bleeding problems:    Problems with blood clotting too easily:        Skin    Rashes or ulcers:        Constitutional    Fever or chills:      PHYSICAL EXAM:    Vitals:   01/30/18 1005  BP: 133/88  Pulse: 85  Resp: 20  Temp: 97.6 F (36.4 C)  TempSrc: Oral  SpO2: 92%  Weight: 157 lb 8 oz (71.4 kg)  Height: 5\' 8"  (1.727 m)    GENERAL: The patient is a well-nourished male, in no acute distress. The vital signs are  documented above. CARDIOVASCULAR: Palpable right radial pulse.  Fistula in his forearm on the left.  He does have 2+ femoral pulses bilaterally.  I do not palpate pedal pulses bilaterally. PULMONARY: There is good air exchange  ABDOMEN: Soft and non-tender  MUSCULOSKELETAL: There are no major deformities or cyanosis. NEUROLOGIC: No focal weakness or paresthesias are detected. SKIN: There are no ulcers or rashes noted. PSYCHIATRIC: The patient has a normal affect.  DATA:  Noninvasive studies in our office today revealed ankle arm index of 0.93 on the right and 0.55 on the left.  MEDICAL ISSUES: I discussed the significance of his superficial femoral occlusive disease with the patient.  I explained that this is not currently limb threatening but could progress to this.  He reports that he is unable to  tolerate this level of claudication.  He reports significant pain with walking and has no other mode of transportation.  He will undergo outpatient arteriography for further evaluation.  Also explained that he may be a candidate for vascular treatment or may require open femoral to popliteal bypass depending on the anatomy.  He had seen Dr. Myra Gianotti years ago for fistulogram and will be scheduled on a nondialysis day for a Tuesday with Dr. Eyvonne Left, MD [facility] [phone] Pager [phone]       Addendum:  The patient has been re-examined and re-evaluated.  The patient's history and physical has been reviewed and is unchanged.    Macedonio ZACHERI DALAL is a 62 y.o. male is being admitted with PERIPHERAL VASCULAR DISEASE WITH CLAUDICATION. All the risks, benefits and other treatment options have been discussed with the patient. The patient has consented to proceed with Procedure(s): BYPASS GRAFT FEMORAL-POPLITEAL ARTERY LEFT LEG as a surgical intervention.  Ahliyah Nienow 04/11/2018 9:15 AM Vascular and Vein Surgery

## 2018-04-12 ENCOUNTER — Telehealth: Payer: Self-pay | Admitting: Vascular Surgery

## 2018-04-12 ENCOUNTER — Encounter (HOSPITAL_COMMUNITY): Payer: Self-pay

## 2018-04-12 ENCOUNTER — Encounter (HOSPITAL_COMMUNITY): Payer: Self-pay | Admitting: Vascular Surgery

## 2018-04-12 LAB — CBC
HEMATOCRIT: 34.7 % — AB (ref 39.0–52.0)
HEMOGLOBIN: 10.4 g/dL — AB (ref 13.0–17.0)
MCH: 32.6 pg (ref 26.0–34.0)
MCHC: 30 g/dL (ref 30.0–36.0)
MCV: 108.8 fL — ABNORMAL HIGH (ref 78.0–100.0)
Platelets: 159 10*3/uL (ref 150–400)
RBC: 3.19 MIL/uL — AB (ref 4.22–5.81)
RDW: 15.2 % (ref 11.5–15.5)
WBC: 3.1 10*3/uL — AB (ref 4.0–10.5)

## 2018-04-12 LAB — BASIC METABOLIC PANEL
ANION GAP: 17 — AB (ref 5–15)
BUN: 68 mg/dL — ABNORMAL HIGH (ref 8–23)
CHLORIDE: 96 mmol/L — AB (ref 98–111)
CO2: 23 mmol/L (ref 22–32)
CREATININE: 13.79 mg/dL — AB (ref 0.61–1.24)
Calcium: 6.6 mg/dL — ABNORMAL LOW (ref 8.9–10.3)
GFR calc Af Amer: 4 mL/min — ABNORMAL LOW (ref >60)
GFR calc non Af Amer: 3 mL/min — ABNORMAL LOW (ref >60)
Glucose, Bld: 131 mg/dL — ABNORMAL HIGH (ref 70–99)
Potassium: >7.5 mmol/L (ref 3.5–5.1)
SODIUM: 136 mmol/L (ref 135–145)

## 2018-04-12 MED ORDER — RENA-VITE PO TABS
1.0000 | ORAL_TABLET | Freq: Every day | ORAL | Status: DC
  Administered 2018-04-12 – 2018-04-20 (×9): 1 via ORAL
  Filled 2018-04-12 (×10): qty 1

## 2018-04-12 MED ORDER — PENTAFLUOROPROP-TETRAFLUOROETH EX AERO: 1.0000 "application " | INHALATION_SPRAY | CUTANEOUS | Status: DC | PRN

## 2018-04-12 MED ORDER — INSULIN ASPART 100 UNIT/ML IV SOLN
10.0000 [IU] | Freq: Once | INTRAVENOUS | Status: AC
  Administered 2018-04-12: 10 [IU] via INTRAVENOUS

## 2018-04-12 MED ORDER — HEPARIN SODIUM (PORCINE) 1000 UNIT/ML IJ SOLN
INTRAMUSCULAR | Status: AC
  Administered 2018-04-12: 14:00:00
  Filled 2018-04-12: qty 1

## 2018-04-12 MED ORDER — CALCITRIOL 0.25 MCG PO CAPS
0.7500 ug | ORAL_CAPSULE | ORAL | Status: DC
  Administered 2018-04-13 – 2018-04-20 (×5): 0.75 ug via ORAL
  Filled 2018-04-12 (×2): qty 3

## 2018-04-12 MED ORDER — DEXTROSE 50 % IV SOLN
1.0000 | Freq: Once | INTRAVENOUS | Status: AC
  Administered 2018-04-12: 50 mL via INTRAVENOUS
  Filled 2018-04-12: qty 50

## 2018-04-12 MED ORDER — LIDOCAINE-PRILOCAINE 2.5-2.5 % EX CREA: 1.0000 "application " | TOPICAL_CREAM | CUTANEOUS | Status: DC | PRN

## 2018-04-12 MED ORDER — SODIUM CHLORIDE 0.9 % IV SOLN
1.0000 g | Freq: Once | INTRAVENOUS | Status: AC
  Administered 2018-04-12: 1 g via INTRAVENOUS
  Filled 2018-04-12: qty 10

## 2018-04-12 MED ORDER — CALCIUM CARBONATE ANTACID 500 MG PO CHEW
400.0000 mg | CHEWABLE_TABLET | Freq: Two times a day (BID) | ORAL | Status: DC
  Administered 2018-04-12 – 2018-04-21 (×17): 400 mg via ORAL
  Filled 2018-04-12 (×17): qty 2

## 2018-04-12 MED ORDER — HEPARIN SODIUM (PORCINE) 1000 UNIT/ML DIALYSIS
20.0000 [IU]/kg | Freq: Once | INTRAMUSCULAR | Status: AC
  Administered 2018-04-12: 1500 [IU] via INTRAVENOUS_CENTRAL

## 2018-04-12 MED ORDER — HEPARIN SODIUM (PORCINE) 1000 UNIT/ML IJ SOLN
INTRAMUSCULAR | Status: AC
  Administered 2018-04-12: 1500 [IU] via INTRAVENOUS_CENTRAL
  Filled 2018-04-12: qty 3

## 2018-04-12 MED ORDER — SEVELAMER CARBONATE 800 MG PO TABS
2400.0000 mg | ORAL_TABLET | Freq: Three times a day (TID) | ORAL | Status: DC
  Administered 2018-04-12 – 2018-04-21 (×20): 2400 mg via ORAL
  Filled 2018-04-12 (×21): qty 3

## 2018-04-12 MED ORDER — SODIUM CHLORIDE 0.9 % IV SOLN
125.0000 mg | Freq: Once | INTRAVENOUS | Status: AC
  Administered 2018-04-13: 125 mg via INTRAVENOUS
  Filled 2018-04-12 (×2): qty 10

## 2018-04-12 MED ORDER — OXYCODONE-ACETAMINOPHEN 5-325 MG PO TABS
ORAL_TABLET | ORAL | Status: AC
  Filled 2018-04-12: qty 1

## 2018-04-12 MED ORDER — SODIUM CHLORIDE 0.9 % IV SOLN: 100.0000 mL | INTRAVENOUS | Status: DC | PRN

## 2018-04-12 MED ORDER — SODIUM BICARBONATE 8.4 % IV SOLN
50.0000 meq | Freq: Once | INTRAVENOUS | Status: AC
  Administered 2018-04-12: 50 meq via INTRAVENOUS
  Filled 2018-04-12: qty 50

## 2018-04-12 NOTE — Consult Note (Addendum)
[facility] Renal Consultation Note    Indication for Consultation:  Management of ESRD/hemodialysis; anemia, hypertension/volume and secondary hyperparathyroidism  HPI: Raymond [name] is a 62 y.o. male with ESRD on HD MWF at [facility]. PMH HTN, COPD, Hep C (treated), Hx substance abuse.   Admitted with LLE claudication symptoms. He underwent left fem-pop bypass yesterday per Dr. Donnetta Hutching. He missed his usual dialysis yesterday and this early this morning found to be hyperkalemic with K >7.5. Received a dose of calcium gluconate and urgent dialysis initiated this am.   Seen on HD. Dialyzes via L UE AVF. Does endorsed some pain at surgical site. Denies CP, SOB, N,V,D   Past Medical History:  Diagnosis Date  . Anemia   . COPD (chronic obstructive pulmonary disease) ([place])   . Dyspnea    "with too much fluid"  . ESRD (end stage renal disease) on dialysis [facility])    "MWF; Jeneen Rinks" (07/22/17)  . Hemodialysis patient ([place])   . Hepatitis C    Still positive s/p liver biopsy at [facility]  and interferon therapy for 6 months. Most recent lab work was on 10/24/12  . Hepatitis C    "took the tx; gone now" (12/05/2016)  Was treated  . History of blood transfusion ~ 2012/2013   "related to my kidneys; blood was low"  . Hypertension   . Substance abuse ([place])   . Thyroid disease    Past Surgical History:  Procedure Laterality Date  . ABDOMINAL AORTOGRAM W/LOWER EXTREMITY N/A 02/08/2018   Procedure: ABDOMINAL AORTOGRAM W/LOWER EXTREMITY;  Surgeon: Conrad Kapaau, MD;  Location: [place] CV LAB;  Service: Cardiovascular;  Laterality: N/A;  . AV FISTULA PLACEMENT Left Aug. 2013 ?  [name] LEFT HEART CATH AND CORONARY ANGIOGRAPHY N/A 03/29/2018   Procedure: LEFT HEART CATH AND CORONARY ANGIOGRAPHY;  Surgeon: Nelva Bush, MD;  Location: [place] CV LAB;  Service: Cardiovascular;  Laterality: N/A;  . LIVER BIOPSY    . ORIF ULNAR FRACTURE Left 05/18/2017   Procedure: OPEN  REDUCTION INTERNAL FIXATION (ORIF) ULNAR FRACTURE;  Surgeon: Iran Planas, MD;  Location: [place];  Service: Orthopedics;  Laterality: Left;  . SHUNTOGRAM N/A 12/11/2012   Procedure: Earney Mallet;  Surgeon: Serafina Mitchell, MD;  Location: [facility] CATH LAB;  Service: Cardiovascular;  Laterality: N/A;   Family History  Problem Relation Age of Onset  . Diabetes Father   . Hypertension Father   . Heart disease Father    Social History:  reports that he has been smoking cigarettes. He has a 25.00 pack-year smoking history. He has never used smokeless tobacco. He reports that he drinks alcohol. He reports that he has current or past drug history. Drug: Cocaine. No Known Allergies Prior to Admission medications   Medication Sig Start Date End Date Taking? Authorizing Provider  acetaminophen (TYLENOL) 325 MG tablet Take 2 tablets (650 mg total) by mouth every 6 (six) hours as needed. Patient taking differently: Take 650 mg by mouth every 6 (six) hours as needed for mild pain.  07/22/17  Yes Varney Biles, MD  albuterol (PROVENTIL HFA;VENTOLIN HFA) 108 (90 Base) MCG/ACT inhaler Inhale 2 puffs into the lungs every 6 (six) hours as needed for wheezing or shortness of breath. 12/06/16  Yes Lorella Nimrod, MD  amLODipine (NORVASC) 5 MG tablet Take 1 tablet (5 mg total) by mouth daily. 09/12/17  Yes Scot Jun, FNP  aspirin EC 81 MG tablet Take 81 mg by mouth daily.   Yes  [provider]  atorvastatin (LIPITOR) 40 MG tablet Take 1 tablet (40 mg total) by mouth daily. 03/27/18 06/25/18 Yes Isaiah Serge, NP  cinacalcet (SENSIPAR) 30 MG tablet Take 30 mg by mouth daily.  10/12/15  Yes [provider]  gabapentin (NEURONTIN) 300 MG capsule TAKE 1 CAPSULE BY MOUTH AT BEDTIME AS NEEDED FOR SLEEP Patient taking differently: Take 300 mg by mouth at bedtime as needed (sleep).  09/12/17  Yes Scot Jun, FNP  metoprolol succinate (TOPROL-XL) 25 MG 24 hr tablet Take 2 tablets (50 mg total) by mouth  daily. 07/13/17  Yes Scot Jun, FNP  ondansetron (ZOFRAN) 4 MG tablet Take 1 tablet (4 mg total) by mouth every 8 (eight) hours as needed for nausea or vomiting. 04/03/18  Yes Thurnell Lose, MD  pantoprazole (PROTONIX) 40 MG tablet Take 1 tablet (40 mg total) by mouth daily. 04/03/18  Yes Thurnell Lose, MD  sevelamer (RENAGEL) 800 MG tablet Take 4,800 mg by mouth 3 (three) times daily with meals.    Yes [provider]   Current Facility-Administered Medications  Medication Dose Route Frequency Provider Last Rate Last Dose  . 0.9 %  sodium chloride infusion  500 mL Intravenous Once PRN Rhyne, Samantha J, PA-C      . 0.9 %  sodium chloride infusion  100 mL Intravenous PRN Roney Jaffe, MD      . acetaminophen (TYLENOL) tablet 325-650 mg  325-650 mg Oral Q4H PRN Rhyne, Samantha J, PA-C       Or  . acetaminophen (TYLENOL) suppository 325-650 mg  325-650 mg Rectal Q4H PRN Rhyne, Samantha J, PA-C      . albuterol (PROVENTIL) (2.5 MG/3ML) 0.083% nebulizer solution 3 mL  3 mL Nebulization Q6H PRN Rhyne, Samantha J, PA-C      . alum & mag hydroxide-simeth (MAALOX/MYLANTA) 200-200-20 MG/5ML suspension 15-30 mL  15-30 mL Oral Q2H PRN Rhyne, Samantha J, PA-C      . amLODipine (NORVASC) tablet 5 mg  5 mg Oral Daily Rhyne, Samantha J, PA-C   5 mg at 04/11/18 2044  . aspirin EC tablet 81 mg  81 mg Oral Daily Rhyne, Samantha J, PA-C      . atorvastatin (LIPITOR) tablet 40 mg  40 mg Oral Daily Rhyne, Hulen Shouts, PA-C   40 mg at 04/11/18 2044  . bisacodyl (DULCOLAX) suppository 10 mg  10 mg Rectal Daily PRN Rhyne, Samantha J, PA-C      . ceFAZolin (ANCEF) IVPB 2g/100 mL premix  2 g Intravenous Once Einar Grad, RPH      . Chlorhexidine Gluconate Cloth 2 % PADS 6 each  6 each Topical Q0600 Roney Jaffe, MD      . cinacalcet [facility]) tablet 30 mg  30 mg Oral Daily Rhyne, Samantha J, PA-C      . docusate sodium (COLACE) capsule 100 mg  100 mg Oral Daily Rhyne, Samantha J, PA-C       . gabapentin (NEURONTIN) capsule 300 mg  300 mg Oral QHS PRN Rhyne, Samantha J, PA-C      . guaiFENesin-dextromethorphan (ROBITUSSIN DM) 100-10 MG/5ML syrup 15 mL  15 mL Oral Q4H PRN Rhyne, Samantha J, PA-C      . heparin 1000 UNIT/ML injection           . [START ON 04/13/2018] heparin injection 1,500 Units  20 Units/kg Dialysis Once in dialysis Roney Jaffe, MD      . heparin injection 5,000 Units  5,000 Units Subcutaneous Q8H Rhyne, Samantha J, PA-C      . hydrALAZINE (APRESOLINE) injection 5 mg  5 mg Intravenous Q20 Min PRN Rhyne, Samantha J, PA-C      . labetalol (NORMODYNE,TRANDATE) injection 10 mg  10 mg Intravenous Q10 min PRN Rhyne, Samantha J, PA-C      . lidocaine-prilocaine (EMLA) cream 1 application  1 application Topical PRN Roney Jaffe, MD      . magnesium sulfate IVPB 2 g 50 mL  2 g Intravenous Daily PRN Rhyne, Samantha J, PA-C      . metoprolol succinate (TOPROL-XL) 24 hr tablet 50 mg  50 mg Oral Daily Rhyne, Samantha J, PA-C      . metoprolol tartrate (LOPRESSOR) injection 2-5 mg  2-5 mg Intravenous Q2H PRN Rhyne, Samantha J, PA-C      . morphine 2 MG/ML injection 2 mg  2 mg Intravenous Q2H PRN Rhyne, Samantha J, PA-C   2 mg at 04/11/18 1904  . ondansetron (ZOFRAN) injection 4 mg  4 mg Intravenous Q6H PRN Rhyne, Samantha J, PA-C      . ondansetron (ZOFRAN) tablet 4 mg  4 mg Oral Q8H PRN Rhyne, Samantha J, PA-C      . oxyCODONE-acetaminophen (PERCOCET/ROXICET) 5-325 MG per tablet 1-2 tablet  1-2 tablet Oral Q4H PRN Rhyne, Hulen Shouts, PA-C   2 tablet at 04/11/18 1719  . pantoprazole (PROTONIX) EC tablet 40 mg  40 mg Oral Daily Rhyne, Samantha J, PA-C      . pentafluoroprop-tetrafluoroeth (GEBAUERS) aerosol 1 application  1 application Topical PRN Roney Jaffe, MD      . phenol (CHLORASEPTIC) mouth spray 1 spray  1 spray Mouth/Throat PRN Rhyne, Samantha J, PA-C      . polyethylene glycol (MIRALAX / GLYCOLAX) packet 17 g  17 g Oral Daily PRN Rhyne, Samantha J, PA-C      .  sevelamer carbonate (RENVELA) tablet 800 mg  800 mg Oral TID WC Rhyne, Samantha J, PA-C       Facility-Administered Medications Ordered in Other Encounters  Medication Dose Route Frequency Provider Last Rate Last Dose  . midazolam (VERSED) 5 MG/5ML injection    Anesthesia Intra-op Sammie Bench, CRNA   2 mg at 04/11/18 1042    ROS: As per HPI otherwise negative.  Physical Exam: Vitals:   04/11/18 1848 04/11/18 2050 04/12/18 0017 04/12/18 0404  BP: (!) 164/94 (!) 138/103 (!) 144/87 124/82  Pulse: 75 74 63 68  Resp:  18 (!) 9 10  Temp: (!) 97 F (36.1 C) 98.3 F (36.8 C) 97.6 F (36.4 C) 98.8 F (37.1 C)  TempSrc: Oral Axillary Oral Oral  SpO2:  100% 100% 99%  Weight: 73.4 kg   73.7 kg  Height: 5\' 8"  (1.727 m)        General: WDWN male NAD  Head: NCAT sclera not icteric MMM Neck: Supple. No JVD No masses Lungs: CTA bilaterally without wheezes, rales, or rhonchi. Breathing is unlabored. Heart: RRR with S1 S2 Abdomen: soft NT + BS Lower extremities: long popliteal incsion LLE mild drainage, mild edema  Neuro: A & O  X 3.  Psych:  Responds to questions appropriately with a normal affect. Dialysis Access: LUE AVF cannulated on HD   Labs: Basic Metabolic Panel: Recent Labs  Lab 04/11/18 0835 04/12/18 0346  NA 139 136  K 5.5* >7.5*  CL 94* 96*  CO2 29 23  GLUCOSE 73 131*  BUN 46* 68*  CREATININE 12.09* 13.79*  CALCIUM 7.7* 6.6*   Liver Function Tests: Recent Labs  Lab 04/11/18 0835  AST 19  ALT 10  ALKPHOS 58  BILITOT 0.5  PROT 7.3  ALBUMIN 3.2*   No results for input(s): LIPASE, AMYLASE in the last 168 hours. No results for input(s): AMMONIA in the last 168 hours. CBC: Recent Labs  Lab 04/11/18 0835 04/12/18 0346  WBC 3.8* 3.1*  HGB 11.4* 10.4*  HCT 38.3* 34.7*  MCV 110.1* 108.8*  PLT 155 159   Cardiac Enzymes: No results for input(s): CKTOTAL, CKMB, CKMBINDEX, TROPONINI in the last 168 hours. CBG: No results for input(s): GLUCAP in the  last 168 hours. Iron Studies: No results for input(s): IRON, TIBC, TRANSFERRIN, FERRITIN in the last 72 hours. Studies/Results: No results found.  Dialysis Orders:  [facility] MWF 4H 180 NRe 450/800  71.5 kg 2.0K/2. Ca UFP 4 -Heparin 2000 units IV TIW -Sensipar 180 mg PO TIW -Calcitriol 0.75 mcg PO TIW -Mircera 100 mcg IV q 2 weeks (last dose 9/27) -Venofer 100 mg IV  q HD until 10/4   Assessment/Plan: 1. PAD s/p L fem-pop bypass per Dr. Donnetta Hutching 10/2 - per primary.  Would avoid morphine in ESRD patient.  2. Hyperkalemia - Missed HD yesterday d/t surgery. Will correct with HD today. Follow labs.  3. ESRD -  HD MWF. HD off schedule today then back on schedule tomorrow  4. Hypertension/volume  - BP controlled. Home meds amlodipine 5, metoprolol 50 bid. UF to EDW today.  5. Anemia  - Hgb 10.4. On ESA as outpatient follow trends. Continue Fe bolus x 1  6. Metabolic bone disease -  Ca low. Continue Calcitriol. D/C Sensipar. Use added Ca bath next HD. Add Ca carbonate between meals.  7. Nutrition - Renal diet/fluid restriction/vitamins.   Lynnda Child PA-C [facility] Pager [phone] 04/12/2018, 8:36 AM    Pt seen, examined and agree w A/P as above. ESRD pt had vasc surg yesterday, this am K+ was very high but pt is stable and on HD now w/ low K+ bath.  Plan as above.  Kelly Splinter MD [employer] pager [phone]   04/12/2018, 11:31 AM

## 2018-04-12 NOTE — Anesthesia Postprocedure Evaluation (Signed)
Anesthesia Post Note  Patient: Raymond [name]  Procedure(s) Performed: BYPASS GRAFT FEMORAL-POPLITEAL ARTERY LEFT LEG (Left Leg Lower)     Patient location during evaluation: PACU Anesthesia Type: General Level of consciousness: awake and alert, awake and oriented Pain management: pain level controlled Vital Signs Assessment: post-procedure vital signs reviewed and stable Respiratory status: spontaneous breathing, nonlabored ventilation and respiratory function stable Cardiovascular status: blood pressure returned to baseline and stable Postop Assessment: no apparent nausea or vomiting Anesthetic complications: no    Last Vitals:  Vitals:   04/12/18 0017 04/12/18 0404  BP: (!) 144/87 124/82  Pulse: 63 68  Resp: (!) 9 10  Temp: 36.4 C 37.1 C  SpO2: 100% 99%    Last Pain:  Vitals:   04/12/18 0404  TempSrc: Oral  PainSc:                  [name]

## 2018-04-12 NOTE — Progress Notes (Signed)
Patient off unit to dialysis. Unable to assess at this time.

## 2018-04-12 NOTE — Telephone Encounter (Signed)
sch appt lvm 05/08/18 3pm p/o MD

## 2018-04-12 NOTE — Evaluation (Signed)
Occupational Therapy Evaluation Patient Details Name: Raymond [name] MRN: [id number] DOB: [birth year] Today's Date: 04/12/2018    History of Present Illness s/p L fem-pop BPG PMH: ESRD, COPD, HTN, Hep C, h/o substance abuse.   Clinical Impression   Pt is typically independent in ADL and IADL. He lives with his niece in a two level apartment with 5 steps to get inside. Pt presents with 7/10 L LE pain and impaired standing balance as he is unable to tolerate weight on his L LE. Session limited to pt pivoting to chair with RW with min assist. Discharge disposition is dependent on pt being able to manage stairs, but he is hopeful to return home. Will follow acutely.    Follow Up Recommendations  Home health OT(depending on ability to manage stairs)    Equipment Recommendations  3 in 1 bedside commode(RW)    Recommendations for Other Services       Precautions / Restrictions Precautions Precautions: Fall      Mobility Bed Mobility Overal bed mobility: Needs Assistance Bed Mobility: Supine to Sit     Supine to sit: Min assist     General bed mobility comments: increased time, supported L LE  Transfers Overall transfer level: Needs assistance Equipment used: Rolling walker (2 wheeled) Transfers: Sit to/from [facility] Sit to Stand: Min assist Stand pivot transfers: Min assist       General transfer comment: cues for hand placement, assist to rise and steady as pt pivoted to chair, no weight tolerated on L LE    Balance Overall balance assessment: Needs assistance   Sitting balance-Leahy Scale: Good     Standing balance support: Bilateral upper extremity supported Standing balance-Leahy Scale: Poor Standing balance comment: heavy reliance on UEs, no weight on L LE due to pain                           ADL either performed or assessed with clinical judgement   ADL Overall ADL's : Needs assistance/impaired Eating/Feeding:  Independent;Sitting   Grooming: Set up;Sitting;Wash/dry hands   Upper Body Bathing: Set up;Sitting   Lower Body Bathing: Maximal assistance;Sit to/from stand   Upper Body Dressing : Set up;Sitting   Lower Body Dressing: Bed level;Total assistance   Toilet Transfer: Minimal assistance;Stand-pivot;RW;BSC           Functional mobility during ADLs: (did not ambulate this session due to pain and elevated K)       Vision Baseline Vision/History: Cataracts Patient Visual Report: No change from baseline       Perception     Praxis      Pertinent Vitals/Pain Pain Assessment: 0-10 Pain Score: 7  Pain Location: L LE Pain Descriptors / Indicators: Burning Pain Intervention(s): Monitored during session;Premedicated before session;Repositioned     Hand Dominance Right   Extremity/Trunk Assessment Upper Extremity Assessment Upper Extremity Assessment: Overall WFL for tasks assessed   Lower Extremity Assessment Lower Extremity Assessment: Defer to PT evaluation       Communication Communication Communication: No difficulties   Cognition Arousal/Alertness: Awake/alert Behavior During Therapy: WFL for tasks assessed/performed Overall Cognitive Status: Within Functional Limits for tasks assessed                                     General Comments       Exercises     Shoulder Instructions  Home Living Family/patient expects to be discharged to:: Private residence Living Arrangements: Other relatives(niece) Available Help at Discharge: Family;Available PRN/intermittently Type of Home: Apartment Home Access: Stairs to enter Entrance Stairs-Number of Steps: 5 Entrance Stairs-Rails: Right;Left;Can reach both Home Layout: Two level;Bed/bath upstairs Alternate Level Stairs-Number of Steps: flight Alternate Level Stairs-Rails: Right Bathroom Shower/Tub: Teacher, early years/pre: Standard     Home Equipment: None          Prior  Functioning/Environment Level of Independence: Independent        Comments: was taking a city bus to HD        OT Problem List:        OT Treatment/Interventions: Self-care/ADL training;DME and/or AE instruction;Patient/family education;Balance training;Therapeutic activities    OT Goals(Current goals can be found in the care plan section) Acute Rehab OT Goals Patient Stated Goal: to go home OT Goal Formulation: With patient Time For Goal Achievement: 04/26/18 Potential to Achieve Goals: Good ADL Goals Pt Will Perform Grooming: with supervision;standing Pt Will Perform Lower Body Bathing: with supervision;with adaptive equipment;sit to/from stand Pt Will Perform Lower Body Dressing: with supervision;with adaptive equipment;sit to/from stand Pt Will Transfer to Toilet: with supervision;ambulating;bedside commode(over toilet) Pt Will Perform Toileting - Clothing Manipulation and hygiene: with supervision;sit to/from stand Additional ADL Goal #1: Pt will perform bed mobility independently.  OT Frequency: Min 2X/week   Barriers to D/C: Decreased caregiver support;Inaccessible home environment  pt has 5 steps into apartment and living area is on top floor       Co-evaluation              AM-PAC PT "6 Clicks" Daily Activity     Outcome Measure Help from another person eating meals?: None Help from another person taking care of personal grooming?: A Little Help from another person toileting, which includes using toliet, bedpan, or urinal?: A Lot Help from another person bathing (including washing, rinsing, drying)?: A Lot Help from another person to put on and taking off regular upper body clothing?: A Little Help from another person to put on and taking off regular lower body clothing?: Total 6 Click Score: 15   End of Session Equipment Utilized During Treatment: Gait belt;Rolling walker Nurse Communication: Mobility status  Activity Tolerance: Patient tolerated  treatment well Patient left: in chair;with call bell/phone within reach  OT Visit Diagnosis: Unsteadiness on feet (R26.81);Other abnormalities of gait and mobility (R26.89);Pain                Time: 4503-8882 OT Time Calculation (min): 35 min Charges:  OT General Charges $OT Visit: 1 Visit OT Evaluation $OT Eval Moderate Complexity: 1 Mod OT Treatments $Self Care/Home Management : 8-22 mins  Nestor Lewandowsky, OTR/L Acute Rehabilitation Services Pager: [phone] Office: [phone]  [name] 04/12/2018, 3:50 PM

## 2018-04-12 NOTE — Progress Notes (Signed)
PT Cancellation Note  Patient Details Name: Raymond [name] MRN: [id number] DOB: [birth year]   Cancelled Treatment:    Reason Eval/Treat Not Completed: Patient at procedure or test/unavailable(Pt still in HD.  Will return as able. )   Denice Paradise 04/12/2018, 11:55 AM [place] Pager:  [phone]  Office:  [phone]

## 2018-04-12 NOTE — Progress Notes (Signed)
CRITICAL VALUE ALERT  Critical Value:  KCl >7.5  Date & Time Notied:  05:15  Provider Notified: Dr. Donnetta Hutching and Dr. Moshe Cipro  Orders Received.

## 2018-04-12 NOTE — Procedures (Signed)
   I was present at this dialysis session, have reviewed the session itself and made  appropriate changes Kelly Splinter MD [place] pager [phone]   04/12/2018, 11:32 AM

## 2018-04-12 NOTE — Progress Notes (Signed)
OT Cancellation Note  Patient Details Name: Raymond [name] MRN: [id number] DOB: 03/28/1956   Cancelled Treatment:    Reason Eval/Treat Not Completed: Patient at procedure or test/ unavailable(Pt in HD.)  [name] 04/12/2018, 8:06 AM

## 2018-04-12 NOTE — Progress Notes (Addendum)
[facility]  Subjective  - sore and painful left LE at incision sites.   Objective 124/82 68 98.8 F (37.1 C) (Oral) 10 99%  Intake/Output Summary (Last 24 hours) at 04/12/2018 0718 Last data filed at 04/11/2018 1545 Gross per 24 hour  Intake 1050 ml  Output 200 ml  Net 850 ml    Right DP doppler, left palpable DP with brisk doppler signals,  PT without doppler signal Left groin soft without hematoma, incision healing well.  Popliteal incision with minimal bloody drainage on dressing.   Mild edema in the left LE, painful to movement. Heart RRR Lungs non labored breathing Gen NAD  Assessment/Planning: Left leg limiting claudication POD # 1  Left femoral to below-knee popliteal bypass with reverse great saphenous vein Patent left fem-pop by pass with palpable DP Pending ABI's for new left LE baseline post op PT eval and treat encourage mobilization.   Pre surgery ABI: 01/30/2018  +-------+-----------+-----------+------------+------------+ ABI/TBIToday's ABIToday's TBIPrevious ABIPrevious TBI +-------+-----------+-----------+------------+------------+ Right 0.93    0.30                 +-------+-----------+-----------+------------+------------+ Left  0.55    0.32                 +-------+-----------+-----------+------------+------------+     [name] 04/12/2018 7:18 AM --  Laboratory Lab Results: Recent Labs    04/11/18 0835 04/12/18 0346  WBC 3.8* 3.1*  HGB 11.4* 10.4*  HCT 38.3* 34.7*  PLT 155 159   BMET Recent Labs    04/11/18 0835 04/12/18 0346  NA 139 136  K 5.5* >7.5*  CL 94* 96*  CO2 29 23  GLUCOSE 73 131*  BUN 46* 68*  CREATININE 12.09* 13.79*  CALCIUM 7.7* 6.6*    COAG Lab Results  Component Value Date   INR 1.16 04/11/2018   INR 1.09 03/31/2018   INR 1.04 08/07/2017   No results found for: PTT  I have examined the patient,  reviewed and agree with above.  Currently in hemodialysis.  Had potassium of 7.5 this morning.  Goal incisions are intact.  2+ dorsalis pedis pulse on the left.  Will begin to mobilize and discharge when comfortable walking  Curt Jews, MD 04/12/2018 10:54 AM

## 2018-04-12 NOTE — Progress Notes (Signed)
PT Cancellation Note  Patient Details Name: GUY SEESE MRN: [id number] DOB: 07/16/1955   Cancelled Treatment:    Reason Eval/Treat Not Completed: Patient at procedure or test/unavailable(Pt in HD.  Will return as able.  )   Denice Paradise 04/12/2018, 8:29 AM [place] Pager:  [phone]  Office:  [phone]

## 2018-04-12 NOTE — Progress Notes (Addendum)
Patient back to room from dialysis. V/s and assessment done. No needs at this time per patient.   Emelda Fear, RN

## 2018-04-12 NOTE — Progress Notes (Signed)
Physical Therapy Treatment Patient Details Name: Raymond [name] MRN: [id number] DOB: 09/13/1955 Today's Date: 04/12/2018    History of Present Illness s/p L fem-pop BPG PMH: ESRD, COPD, HTN, Hep C, h/o substance abuse.    PT Comments    Pt admitted with above diagnosis. Pt currently with functional limitations due to the deficits listed below (see PT Problem List). Prior to surgery, patient independent with ADL's and mobility. He lives with his niece and has 5 steps to negotiate to enter/exit home. Currently presenting with decreased functional mobility secondary to LLE pain, weakness, and balance impairments. Transferring bed to chair with walker and min assist; unable to bear weight through LLE. HR 70-80s, SpO2 96% on RA during mobility. Pt will benefit from skilled PT to increase their independence and safety with mobility to allow discharge to the venue listed below.     Follow Up Recommendations  Home health PT;Supervision for mobility/OOB (pending ability to negotiate steps)     Equipment Recommendations  3in1 (PT);Rolling walker with 5" wheels    Recommendations for Other Services       Precautions / Restrictions Precautions Precautions: Fall Restrictions Weight Bearing Restrictions: No    Mobility  Bed Mobility Overal bed mobility: Needs Assistance Bed Mobility: Sit to Supine     Supine to sit: Min guard     General bed mobility comments: no physical assistance needed; increased time  Transfers Overall transfer level: Needs assistance Equipment used: Rolling walker (2 wheeled) Transfers: Sit to/from [facility] Sit to Stand: Min guard Stand pivot transfers: Min assist       General transfer comment: Min assist for transfer from bed to chair. Required directional cues, no weight tolerated on L LE with increased forward trunk flexion. Decreased safety awareness due to pain  Ambulation/Gait                 Stairs              Wheelchair Mobility    Modified Rankin (Stroke Patients Only)       Balance Overall balance assessment: Needs assistance   Sitting balance-Leahy Scale: Good     Standing balance support: Bilateral upper extremity supported Standing balance-Leahy Scale: Poor Standing balance comment: heavy reliance on UEs, no weight on L LE due to pain                            Cognition Arousal/Alertness: Awake/alert Behavior During Therapy: WFL for tasks assessed/performed Overall Cognitive Status: Within Functional Limits for tasks assessed                                        Exercises      General Comments        Pertinent Vitals/Pain Pain Assessment: 0-10 Pain Score: 7  Pain Location: L LE Pain Descriptors / Indicators: Burning;Other (Comment)("stinging") Pain Intervention(s): Monitored during session;Limited activity within patient's tolerance    Home Living Family/patient expects to be discharged to:: Private residence Living Arrangements: Other relatives(niece) Available Help at Discharge: Family;Available PRN/intermittently Type of Home: Apartment Home Access: Stairs to enter Entrance Stairs-Rails: Right;Left;Can reach both Home Layout: Two level;Bed/bath upstairs Home Equipment: None      Prior Function Level of Independence: Independent      Comments: was taking a city bus to HD   PT Goals (current goals  can now be found in the care plan section) Acute Rehab PT Goals Patient Stated Goal: to go home PT Goal Formulation: With patient Time For Goal Achievement: 04/26/18 Potential to Achieve Goals: Good    Frequency    Min 3X/week      PT Plan      Co-evaluation              AM-PAC PT "6 Clicks" Daily Activity  Outcome Measure  Difficulty turning over in bed (including adjusting bedclothes, sheets and blankets)?: A Little Difficulty moving from lying on back to sitting on the side of the bed? :  Unable Difficulty sitting down on and standing up from a chair with arms (e.g., wheelchair, bedside commode, etc,.)?: Unable Help needed moving to and from a bed to chair (including a wheelchair)?: A Little Help needed walking in hospital room?: A Lot Help needed climbing 3-5 steps with a railing? : A Lot 6 Click Score: 12    End of Session Equipment Utilized During Treatment: Gait belt Activity Tolerance: Patient limited by pain Patient left: in bed;with call bell/phone within reach   PT Visit Diagnosis: Unsteadiness on feet (R26.81);Other abnormalities of gait and mobility (R26.89);Difficulty in walking, not elsewhere classified (R26.2);Pain Pain - Right/Left: Left Pain - part of body: Leg     Time: 7544-9201 PT Time Calculation (min) (ACUTE ONLY): 20 min  Charges:                        Ellamae Sia, PT, DPT Acute Rehabilitation Services Pager [phone] Office [phone]   Willy Eddy 04/12/2018, 4:59 PM

## 2018-04-12 NOTE — Progress Notes (Signed)
Patient left unit for dialysis.

## 2018-04-13 ENCOUNTER — Inpatient Hospital Stay (HOSPITAL_COMMUNITY): Payer: Medicare Other

## 2018-04-13 ENCOUNTER — Encounter (HOSPITAL_COMMUNITY): Payer: Self-pay | Admitting: Nephrology

## 2018-04-13 DIAGNOSIS — R609 Edema, unspecified: Secondary | ICD-10-CM

## 2018-04-13 DIAGNOSIS — M7989 Other specified soft tissue disorders: Secondary | ICD-10-CM

## 2018-04-13 LAB — CBC
HEMATOCRIT: 31.7 % — AB (ref 39.0–52.0)
HEMOGLOBIN: 9.6 g/dL — AB (ref 13.0–17.0)
MCH: 32.8 pg (ref 26.0–34.0)
MCHC: 30.3 g/dL (ref 30.0–36.0)
MCV: 108.2 fL — AB (ref 78.0–100.0)
Platelets: 141 10*3/uL — ABNORMAL LOW (ref 150–400)
RBC: 2.93 MIL/uL — AB (ref 4.22–5.81)
RDW: 15.3 % (ref 11.5–15.5)
WBC: 3.5 10*3/uL — ABNORMAL LOW (ref 4.0–10.5)

## 2018-04-13 LAB — RENAL FUNCTION PANEL
ANION GAP: 11 (ref 5–15)
Albumin: 2.9 g/dL — ABNORMAL LOW (ref 3.5–5.0)
BUN: 43 mg/dL — AB (ref 8–23)
CHLORIDE: 96 mmol/L — AB (ref 98–111)
CO2: 30 mmol/L (ref 22–32)
Calcium: 7.9 mg/dL — ABNORMAL LOW (ref 8.9–10.3)
Creatinine, Ser: 9.24 mg/dL — ABNORMAL HIGH (ref 0.61–1.24)
GFR calc Af Amer: 6 mL/min — ABNORMAL LOW (ref >60)
GFR, EST NON AFRICAN AMERICAN: 5 mL/min — AB (ref >60)
Glucose, Bld: 99 mg/dL (ref 70–99)
POTASSIUM: 5.4 mmol/L — AB (ref 3.5–5.1)
Phosphorus: 6.3 mg/dL — ABNORMAL HIGH (ref 2.5–4.6)
Sodium: 137 mmol/L (ref 135–145)

## 2018-04-13 MED ORDER — OXYCODONE-ACETAMINOPHEN 5-325 MG PO TABS
ORAL_TABLET | ORAL | Status: AC
  Filled 2018-04-13: qty 2

## 2018-04-13 MED ORDER — MORPHINE SULFATE (PF) 2 MG/ML IV SOLN
INTRAVENOUS | Status: AC
  Filled 2018-04-13: qty 1

## 2018-04-13 MED ORDER — CALCITRIOL 0.25 MCG PO CAPS
ORAL_CAPSULE | ORAL | Status: AC
  Filled 2018-04-13: qty 1

## 2018-04-13 MED ORDER — CALCITRIOL 0.5 MCG PO CAPS
ORAL_CAPSULE | ORAL | Status: AC
  Filled 2018-04-13: qty 1

## 2018-04-13 NOTE — Progress Notes (Signed)
ABI completed: Right indicates moderate disease. Left is within normal limits based on ATA, unable to obtain left PTA waveform. Landry Mellow, RDMS, RVT

## 2018-04-13 NOTE — Progress Notes (Signed)
Patient off unit for vascular US at this time

## 2018-04-13 NOTE — Progress Notes (Signed)
Patient off the unit to dialysis.

## 2018-04-13 NOTE — Progress Notes (Addendum)
[facility]  Subjective  - Doing a little better.  Pain issues at incision.   Objective 98/67 69 98 F (36.7 C) (Oral) 13 100%  Intake/Output Summary (Last 24 hours) at 04/13/2018 0719 Last data filed at 04/12/2018 2118 Gross per 24 hour  Intake 490 ml  Output 1500 ml  Net -1010 ml    Palpable DP left LE, leg incision healing well. Left groin soft without hematoma Heart RRR Lungs non labored breathing  Assessment/Planning: POD # 2 Left femoral to below-knee popliteal bypass with reverse great saphenous vein for Left leg limiting claudication  Patent by pass with palpable DP pulse HD today Encourage mobility and pain control. Likely discharge Monday ABI post op pending  Raymond [name] 04/13/2018 7:19 AM --  Laboratory Lab Results: Recent Labs    04/11/18 0835 04/12/18 0346  WBC 3.8* 3.1*  HGB 11.4* 10.4*  HCT 38.3* 34.7*  PLT 155 159   BMET Recent Labs    04/12/18 0346 04/13/18 0338  NA 136 137  K >7.5* 5.4*  CL 96* 96*  CO2 23 30  GLUCOSE 131* 99  BUN 68* 43*  CREATININE 13.79* 9.24*  CALCIUM 6.6* 7.9*    COAG Lab Results  Component Value Date   INR 1.16 04/11/2018   INR 1.09 03/31/2018   INR 1.04 08/07/2017   No results found for: PTT  I have interviewed the patient and examined the patient. I agree with the findings by the PA. Incisions look fine.  Palp DP pulse.  Gae Gallop, MD [phone]

## 2018-04-13 NOTE — Progress Notes (Signed)
PT Cancellation Note  Patient Details Name: Raymond [name] MRN: [id number] DOB: 1955-12-27   Cancelled Treatment:    Reason Eval/Treat Not Completed: Patient at procedure or test/unavailable   Currently in HD;   Will continue to follow,   Roney Marion, PT  Acute Rehabilitation Services Pager [phone] Office [phone]    Colletta [place] 04/13/2018, 11:31 AM

## 2018-04-13 NOTE — Progress Notes (Signed)
[facility] Progress Note  Subjective: on HD, L leg pain, no other c/o's.  No chg in wts.    Vitals:   04/13/18 0800 04/13/18 0830 04/13/18 0900 04/13/18 0910  BP: 93/65 92/70 (!) 80/53 (!) 99/56  Pulse: 74 74 76 73  Resp: 11     Temp:      TempSrc:      SpO2:      Weight:      Height:        Inpatient medications: . amLODipine  5 mg Oral Daily  . aspirin EC  81 mg Oral Daily  . atorvastatin  40 mg Oral Daily  . calcitRIOL  0.75 mcg Oral Q M,W,F-HD  . calcium carbonate  400 mg of elemental calcium Oral BID BM  . Chlorhexidine Gluconate Cloth  6 each Topical Q0600  . docusate sodium  100 mg Oral Daily  . heparin  5,000 Units Subcutaneous Q8H  . metoprolol succinate  50 mg Oral Daily  . multivitamin  1 tablet Oral QHS  . pantoprazole  40 mg Oral Daily  . sevelamer carbonate  2,400 mg Oral TID WC   . sodium chloride    . ferric gluconate (FERRLECIT/NULECIT) IV    . magnesium sulfate 1 - 4 g bolus IVPB     sodium chloride, acetaminophen **OR** acetaminophen, albuterol, bisacodyl, gabapentin, guaiFENesin-dextromethorphan, hydrALAZINE, labetalol, magnesium sulfate 1 - 4 g bolus IVPB, metoprolol tartrate, morphine injection, ondansetron, ondansetron, oxyCODONE-acetaminophen, phenol, polyethylene glycol  Iron/TIBC/Ferritin/ %Sat    Component Value Date/Time   IRON 62 06/20/2017 1207   TIBC 353 06/20/2017 1207   FERRITIN 76 06/21/2017 0451   IRONPCTSAT 18 06/20/2017 1207    Exam: General: WDWN male NAD  Head: NCAT sclera not icteric MMM Neck: Supple. No JVD No masses Lungs: CTA bilaterally without wheezes, rales, or rhonchi. Breathing is unlabored. Heart: RRR with S1 S2 Abdomen: soft NT + BS Lower extremities: long popliteal incsion LLE mild drainage, mild edema  Neuro: A & O  X 3.  Psych:  Responds to questions appropriately with a normal affect. Dialysis Access: LUE AVF cannulated on HD   Dialysis: [facility] MWF  4h  71.5kg  2/2 bath  P4  Hep 2000  L  AVF -Sensipar 180 mg PO TIW -Calcitriol 0.75 mcg PO TIW -Mircera 100 mcg IV q 2 weeks (last dose 9/27) -Venofer 100 mg IV  q HD until 10/4   Assessment/Plan: 1. PAD s/p L fem-pop bypass per Dr. Donnetta Hutching 10/2 - per primary. Pt will be here through weekend per [facility] notes.  2. Hyperkalemia - still up slightly, use 1K bath for 1/2 Rx today 3. ESRD -  HD MWF. HD today to get back on sched 4. Hypertension/volume  - BP's low will dc metop/ norvasc for now 5. Anemia  - Hgb 9.8,drifting down. On ESA as outpatient follow trends. Continue Fe bolus x 1  6. Metabolic bone disease -  Ca low. Continue Calcitriol. D/C Sensipar. Use added Ca bath next HD. Add Ca carbonate between meals.  7. Nutrition - Renal diet/fluid restriction/vitamins.    Kelly Splinter MD [facility] pager [phone]   04/13/2018, 9:34 AM   Recent Labs  Lab 04/11/18 0835 04/12/18 0346 04/13/18 0338  NA 139 136 137  K 5.5* >7.5* 5.4*  CL 94* 96* 96*  CO2 29 23 30   GLUCOSE 73 131* 99  BUN 46* 68* 43*  CREATININE 12.09* 13.79* 9.24*  CALCIUM 7.7* 6.6* 7.9*  PHOS  --   --  6.3*  ALBUMIN 3.2*  --  2.9*  INR 1.16  --   --    Recent Labs  Lab 04/11/18 0835  AST 19  ALT 10  ALKPHOS 58  BILITOT 0.5  PROT 7.3   Recent Labs  Lab 04/12/18 0346 04/13/18 0834  WBC 3.1* 3.5*  HGB 10.4* 9.6*  HCT 34.7* 31.7*  MCV 108.8* 108.2*  PLT 159 141*

## 2018-04-14 NOTE — Progress Notes (Addendum)
   VASCULAR SURGERY ASSESSMENT & PLAN:   3 Days Post-Op s/p: Left femoral to below-knee popliteal artery bypass with vein.  His graft is patent.  His postoperative ABI was normal on the left.  Anticipate discharge once he is more mobile.  He missed physical therapy yesterday because he was at dialysis.  HYPERKALEMIA: Potassium today is down to 5.4.  He is followed by nephrology.  SUBJECTIVE:   No complaints.  He has not been ambulating much.  PHYSICAL EXAM:   Vitals:   04/14/18 0427 04/14/18 0500 04/14/18 0600 04/14/18 0757  BP: 116/75   117/68  Pulse: 81 90 83 79  Resp: 12 14 12 17   Temp:    97.8 F (36.6 C)  TempSrc:    Oral  SpO2: 97% (!) 83% 100% 97%  Weight:      Height:       He has a palpable left dorsalis pedis pulse. All of his incisions look good.  LABS:   Lab Results  Component Value Date   WBC 3.5 (L) 04/13/2018   HGB 9.6 (L) 04/13/2018   HCT 31.7 (L) 04/13/2018   MCV 108.2 (H) 04/13/2018   PLT 141 (L) 04/13/2018   Lab Results  Component Value Date   CREATININE 9.24 (H) 04/13/2018   Lab Results  Component Value Date   INR 1.16 04/11/2018   CBG (last 3)  No results for input(s): GLUCAP in the last 72 hours.  PROBLEM LIST:    Active Problems:   PAD (peripheral artery disease) (HCC)   CURRENT MEDS:   . aspirin EC  81 mg Oral Daily  . atorvastatin  40 mg Oral Daily  . calcitRIOL  0.75 mcg Oral Q M,W,F-HD  . calcium carbonate  400 mg of elemental calcium Oral BID BM  . Chlorhexidine Gluconate Cloth  6 each Topical Q0600  . docusate sodium  100 mg Oral Daily  . heparin  5,000 Units Subcutaneous Q8H  . multivitamin  1 tablet Oral QHS  . pantoprazole  40 mg Oral Daily  . sevelamer carbonate  2,400 mg Oral TID [facility]    Deitra Mayo Beeper: [phone] Office: [phone] 04/14/2018

## 2018-04-14 NOTE — Progress Notes (Signed)
[facility] Progress Note  Subjective: on HD, c/o L leg pain and swelling postop  Vitals:   04/14/18 0500 04/14/18 0600 04/14/18 0757 04/14/18 1141  BP:   117/68 105/69  Pulse: 90 83 79 (!) 102  Resp: 14 12 17 20   Temp:   97.8 F (36.6 C) 98.5 F (36.9 C)  TempSrc:   Oral Oral  SpO2: (!) 83% 100% 97% 95%  Weight:      Height:        Inpatient medications: . aspirin EC  81 mg Oral Daily  . atorvastatin  40 mg Oral Daily  . calcitRIOL  0.75 mcg Oral Q M,W,F-HD  . calcium carbonate  400 mg of elemental calcium Oral BID BM  . Chlorhexidine Gluconate Cloth  6 each Topical Q0600  . docusate sodium  100 mg Oral Daily  . heparin  5,000 Units Subcutaneous Q8H  . multivitamin  1 tablet Oral QHS  . pantoprazole  40 mg Oral Daily  . sevelamer carbonate  2,400 mg Oral TID WC   . sodium chloride    . magnesium sulfate 1 - 4 g bolus IVPB     sodium chloride, acetaminophen **OR** acetaminophen, albuterol, bisacodyl, gabapentin, guaiFENesin-dextromethorphan, labetalol, magnesium sulfate 1 - 4 g bolus IVPB, morphine injection, ondansetron, ondansetron, oxyCODONE-acetaminophen, phenol, polyethylene glycol  Iron/TIBC/Ferritin/ %Sat    Component Value Date/Time   IRON 62 06/20/2017 1207   TIBC 353 06/20/2017 1207   FERRITIN 76 06/21/2017 0451   IRONPCTSAT 18 06/20/2017 1207    Exam: General: WDWN male NAD  Head: NCAT sclera not icteric MMM Neck: Supple. No JVD No masses Lungs: CTA bilaterally without wheezes, rales, or rhonchi. Breathing is unlabored. Heart: RRR with S1 S2 Abdomen: soft NT + BS Lower extremities: long popliteal incsion LLE mild drainage, 1-2+ nonpitting edema of LLE Neuro: A & O  X 3.  Psych:  Responds to questions appropriately with a normal affect. Dialysis Access: LUE AVF cannulated on HD   Dialysis: [facility] MWF  4h  71.5kg  2/2 bath  P4  Hep 2000  L AVF -Sensipar 180 mg PO TIW -Calcitriol 0.75 mcg PO TIW -Mircera 100 mcg IV q 2 weeks (last dose  9/27) -Venofer 100 mg IV  q HD until 10/4  - amlodipine 5 qd/ metoprolol xl 50 qd  Assessment/Plan: 1. PAD s/p L fem-pop bypass per Dr. Donnetta Hutching 10/2 - per primary team.  2. Hyperkalemia - recheck in am 3. ESRD -  HD MWF. Next HD 10/7 4. Hypertension/volume  - BP's lowish, holding home metop/amlodipine  5. Anemia  - Hgb 9.8,drifting down. On ESA as outpatient follow trends. Continue Fe bolus x 1  6. Metabolic bone disease -  Ca low. Continue Calcitriol. D/C Sensipar. Use added Ca bath next HD. Add Ca carbonate between meals.  7. Nutrition - Renal diet/fluid restriction/vitamins.    Kelly Splinter MD [facility] pager [phone]   04/14/2018, 12:02 PM   Recent Labs  Lab 04/11/18 0835 04/12/18 0346 04/13/18 0338  NA 139 136 137  K 5.5* >7.5* 5.4*  CL 94* 96* 96*  CO2 29 23 30   GLUCOSE 73 131* 99  BUN 46* 68* 43*  CREATININE 12.09* 13.79* 9.24*  CALCIUM 7.7* 6.6* 7.9*  PHOS  --   --  6.3*  ALBUMIN 3.2*  --  2.9*  INR 1.16  --   --    Recent Labs  Lab 04/11/18 0835  AST 19  ALT 10  ALKPHOS 58  BILITOT 0.5  PROT 7.3   Recent Labs  Lab 04/12/18 0346 04/13/18 0834  WBC 3.1* 3.5*  HGB 10.4* 9.6*  HCT 34.7* 31.7*  MCV 108.8* 108.2*  PLT 159 141*

## 2018-04-14 NOTE — Progress Notes (Addendum)
Physical Therapy Treatment Patient Details Name: Raymond [name] MRN: [id number] DOB: 1955/10/01 Today's Date: 04/14/2018    History of Present Illness s/p L fem-pop BPG PMH: ESRD, COPD, HTN, Hep C, h/o substance abuse.    PT Comments    Pt performed gait training but limited to 4 ft forward and 4 ft backwards.  Pt is slow and guarded and compensates to avoid placing weight on LLE.  Pt positioned in recliner chair and educated to keep LLE as straight as possible to promote healing without HS tightness.  PTA attempted to place pillow under L ankle and to promote knee extension and he was unable to tolerate.  Cold pack applied to L medial knee and ankle to assist in pain management post session.  Will inform nursing of need for L PRAFO boot to encourage L knee extension and avoid ER of L hip.  At this time he is progressing slower than anticipated and will update PT to change recommendations at this time.      Follow Up Recommendations  Supervision for mobility/OOB;CIR     Equipment Recommendations  3in1 (PT);Rolling walker with 5" wheels    Recommendations for Other Services       Precautions / Restrictions Precautions Precautions: Fall Restrictions Weight Bearing Restrictions: No    Mobility  Bed Mobility Overal bed mobility: Needs Assistance Bed Mobility: Supine to Sit     Supine to sit: Min guard     General bed mobility comments: no physical assistance needed; increased time  Transfers Overall transfer level: Needs assistance Equipment used: Rolling walker (2 wheeled) Transfers: Sit to/from Stand Sit to Stand: Min guard         General transfer comment: Cues for hand placement to and from seated surface.  Pt is slow and guarded during transfer.  Required cues for upper trunk control, hip extension and L knee extension.  he remains unable to place foot flat on floor in standing.    Ambulation/Gait Ambulation/Gait assistance: Min assist Gait Distance (Feet): 4  Feet(x2 forwards and backwards.  ) Assistive device: Rolling walker (2 wheeled) Gait Pattern/deviations: Step-to pattern;Decreased dorsiflexion - left;Decreased stride length;Decreased weight shift to left;Trunk flexed     General Gait Details: Cues for upper trunk control, hip extensions, sequencing, stepping closer to RW, and placing L foot flat on the floor.  Pt is slow and guarded and throws trunk forward to compensate for pain in LLE.     Stairs             Wheelchair Mobility    Modified Rankin (Stroke Patients Only)       Balance Overall balance assessment: Needs assistance Sitting-balance support: Single extremity supported         Standing balance-Leahy Scale: Poor Standing balance comment: heavy reliance on UEs, no weight on L LE due to pain                            Cognition Arousal/Alertness: Awake/alert Behavior During Therapy: WFL for tasks assessed/performed Overall Cognitive Status: Within Functional Limits for tasks assessed                                        Exercises Total Joint Exercises Ankle Circles/Pumps: AROM;Left;10 reps;Limitations Ankle Circles/Pumps Limitations: limited ROM Quad Sets: AROM;Left;10 reps;Supine;Limitations Quad Sets Limitations: lack full extension in L knee during  quad set.      General Comments        Pertinent Vitals/Pain Pain Assessment: 0-10 Pain Score: 7  Pain Location: L LE Pain Descriptors / Indicators: Burning;Other (Comment) Pain Intervention(s): Monitored during session;Repositioned;Ice applied    Home Living   Living Arrangements: Other relatives                  Prior Function            PT Goals (current goals can now be found in the care plan section) Acute Rehab PT Goals Patient Stated Goal: to go home Potential to Achieve Goals: Good Progress towards PT goals: Progressing toward goals    Frequency    Min 3X/week      PT Plan Discharge  plan needs to be updated    Co-evaluation              AM-PAC PT "6 Clicks" Daily Activity  Outcome Measure  Difficulty turning over in bed (including adjusting bedclothes, sheets and blankets)?: A Little Difficulty moving from lying on back to sitting on the side of the bed? : Unable Difficulty sitting down on and standing up from a chair with arms (e.g., wheelchair, bedside commode, etc,.)?: Unable Help needed moving to and from a bed to chair (including a wheelchair)?: A Little Help needed walking in hospital room?: A Little Help needed climbing 3-5 steps with a railing? : A Lot 6 Click Score: 13    End of Session Equipment Utilized During Treatment: Gait belt Activity Tolerance: Patient limited by pain Patient left: with call bell/phone within reach;in chair Nurse Communication: Mobility status;Patient requests pain meds PT Visit Diagnosis: Unsteadiness on feet (R26.81);Other abnormalities of gait and mobility (R26.89);Difficulty in walking, not elsewhere classified (R26.2);Pain Pain - Right/Left: Left Pain - part of body: Leg     Time: 1206-1232 PT Time Calculation (min) (ACUTE ONLY): 26 min  Charges:  $Therapeutic Exercise: 8-22 mins $Therapeutic Activity: 8-22 mins                     Joycelyn Rua, PTA Acute Rehabilitation Services Pager [phone] Office [phone]     Jalynn Betzold Artis Delay 04/14/2018, 3:55 PM

## 2018-04-14 NOTE — Progress Notes (Signed)
Occupational Therapy Treatment Patient Details Name: Raymond [name] MRN: [id number] DOB: 10-26-1955 Today's Date: 04/14/2018    History of present illness s/p L fem-pop BPG PMH: ESRD, COPD, HTN, Hep C, h/o substance abuse.   OT comments  Pt is very motivated, but is limited by severity of pain.  He was able to stand x 3, and able to pivot to bed with min A.  He was unable to access feet for LB ADLs, and requires mod - max A for this.  He lives alone.  His niece is available to assist when she is not working.  He walks to bus stop to ride bus to HD.  I don't foresee him regaining this level of independence in next few days.  I anticipate he will require post acute rehab, and feel that with increased time, and the intensity of CIR, he will be able to return home at mod I level for ADLs and intermittent assist for IADLs.  He may need to look at alternative transportation options (I.e [employer], for transportation to HD).   Follow Up Recommendations  CIR;Supervision/Assistance - 24 hour    Equipment Recommendations  3 in 1 bedside commode    Recommendations for Other Services Rehab consult    Precautions / Restrictions Precautions Precautions: Fall Restrictions Weight Bearing Restrictions: No       Mobility Bed Mobility Overal bed mobility: Needs Assistance Bed Mobility: Sit to Supine     Supine to sit: Min guard Sit to supine: Min assist   General bed mobility comments: assist for Lt LE   Transfers Overall transfer level: Needs assistance Equipment used: Rolling walker (2 wheeled) Transfers: Sit to/from [facility] Sit to Stand: Min assist Stand pivot transfers: Min assist       General transfer comment: assist to steady and to move into standing     Balance Overall balance assessment: Needs assistance Sitting-balance support: Single extremity supported(guards due to pain ) Sitting balance-Leahy Scale: Good     Standing balance support: Bilateral upper  extremity supported Standing balance-Leahy Scale: Poor Standing balance comment: heavy reliance on UEs, no weight on L LE due to pain                           ADL either performed or assessed with clinical judgement   ADL Overall ADL's : Needs assistance/impaired Eating/Feeding: Independent;Sitting   Grooming: Set up;Sitting;Wash/dry hands   Upper Body Bathing: Set up;Sitting   Lower Body Bathing: Moderate assistance;Sit to/from stand   Upper Body Dressing : Set up;Sitting   Lower Body Dressing: Maximal assistance;Sit to/from stand   Toilet Transfer: Minimal assistance;Stand-pivot;BSC;RW   Toileting- Clothing Manipulation and Hygiene: Moderate assistance;Sit to/from stand       Functional mobility during ADLs: Minimal assistance;Rolling walker(stand pivot transfer only ) General ADL Comments: Pt limited by pain      Vision       Perception     Praxis      Cognition Arousal/Alertness: Awake/alert Behavior During Therapy: WFL for tasks assessed/performed Overall Cognitive Status: Within Functional Limits for tasks assessed                                          Exercises Total Joint Exercises Ankle Circles/Pumps: AROM;Left;10 reps;Limitations Ankle Circles/Pumps Limitations: limited ROM Quad Sets: AROM;Left;10 reps;Supine;Limitations Quad Sets Limitations: lack full extension  in L knee during quad set.     Shoulder Instructions       General Comments Pt reports niece, who assist him, works full time at [employer], so he will be alone much of the day and he walks to bus station to take bus to HD     Pertinent Vitals/ Pain       Pain Assessment: 0-10 Pain Score: 8  Pain Location: L LE Pain Descriptors / Indicators: Burning;Grimacing;Guarding Pain Intervention(s): Limited activity within patient's tolerance;Monitored during session;Repositioned;Patient requesting pain meds-RN notified  Home Living   Living Arrangements:  Other relatives                                      Prior Functioning/Environment              Frequency  Min 2X/week        Progress Toward Goals  OT Goals(current goals can now be found in the care plan section)  Progress towards OT goals: Progressing toward goals  Acute Rehab OT Goals Patient Stated Goal: to go home  Plan Discharge plan needs to be updated    Co-evaluation                 AM-PAC PT "6 Clicks" Daily Activity     Outcome Measure   Help from another person eating meals?: None Help from another person taking care of personal grooming?: A Little Help from another person toileting, which includes using toliet, bedpan, or urinal?: A Lot Help from another person bathing (including washing, rinsing, drying)?: A Lot Help from another person to put on and taking off regular upper body clothing?: A Little Help from another person to put on and taking off regular lower body clothing?: A Lot 6 Click Score: 16    End of Session Equipment Utilized During Treatment: Rolling walker  OT Visit Diagnosis: Pain Pain - Right/Left: Left Pain - part of body: Leg   Activity Tolerance Patient limited by pain   Patient Left in bed;with call bell/phone within reach;with bed alarm set   Nurse Communication Mobility status;Patient requests pain meds        Time: 1520-1543 OT Time Calculation (min): 23 min  Charges: OT General Charges $OT Visit: 1 Visit OT Treatments $Therapeutic Activity: 23-37 mins  Lucille Passy, OTR/L [place] Pager [phone] Office [phone]    Lucille Passy M 04/14/2018, 4:16 PM

## 2018-04-15 ENCOUNTER — Encounter (HOSPITAL_COMMUNITY): Payer: Self-pay | Admitting: Student

## 2018-04-15 ENCOUNTER — Inpatient Hospital Stay (HOSPITAL_COMMUNITY): Payer: Medicare Other

## 2018-04-15 DIAGNOSIS — R609 Edema, unspecified: Secondary | ICD-10-CM

## 2018-04-15 DIAGNOSIS — I480 Paroxysmal atrial fibrillation: Secondary | ICD-10-CM

## 2018-04-15 DIAGNOSIS — M7989 Other specified soft tissue disorders: Secondary | ICD-10-CM

## 2018-04-15 DIAGNOSIS — I739 Peripheral vascular disease, unspecified: Principal | ICD-10-CM

## 2018-04-15 LAB — BASIC METABOLIC PANEL
Anion gap: 12 (ref 5–15)
BUN: 50 mg/dL — AB (ref 8–23)
CHLORIDE: 94 mmol/L — AB (ref 98–111)
CO2: 28 mmol/L (ref 22–32)
Calcium: 9 mg/dL (ref 8.9–10.3)
Creatinine, Ser: 10.41 mg/dL — ABNORMAL HIGH (ref 0.61–1.24)
GFR calc Af Amer: 5 mL/min — ABNORMAL LOW (ref >60)
GFR calc non Af Amer: 5 mL/min — ABNORMAL LOW (ref >60)
GLUCOSE: 103 mg/dL — AB (ref 70–99)
POTASSIUM: 5.1 mmol/L (ref 3.5–5.1)
Sodium: 134 mmol/L — ABNORMAL LOW (ref 135–145)

## 2018-04-15 LAB — CBC
HEMATOCRIT: 30.3 % — AB (ref 39.0–52.0)
HEMOGLOBIN: 9.3 g/dL — AB (ref 13.0–17.0)
MCH: 32.7 pg (ref 26.0–34.0)
MCHC: 30.7 g/dL (ref 30.0–36.0)
MCV: 106.7 fL — ABNORMAL HIGH (ref 78.0–100.0)
Platelets: 141 10*3/uL — ABNORMAL LOW (ref 150–400)
RBC: 2.84 MIL/uL — ABNORMAL LOW (ref 4.22–5.81)
RDW: 15.3 % (ref 11.5–15.5)
WBC: 5.4 10*3/uL (ref 4.0–10.5)

## 2018-04-15 IMAGING — DX DG CHEST 1V PORT
1 series · 1 of 1 positions shown · non-contrast
Comparison: [DATE] chest radiograph.

CLINICAL DATA: Tachycardia

EXAM:
PORTABLE CHEST 1 VIEW

[chest ap]
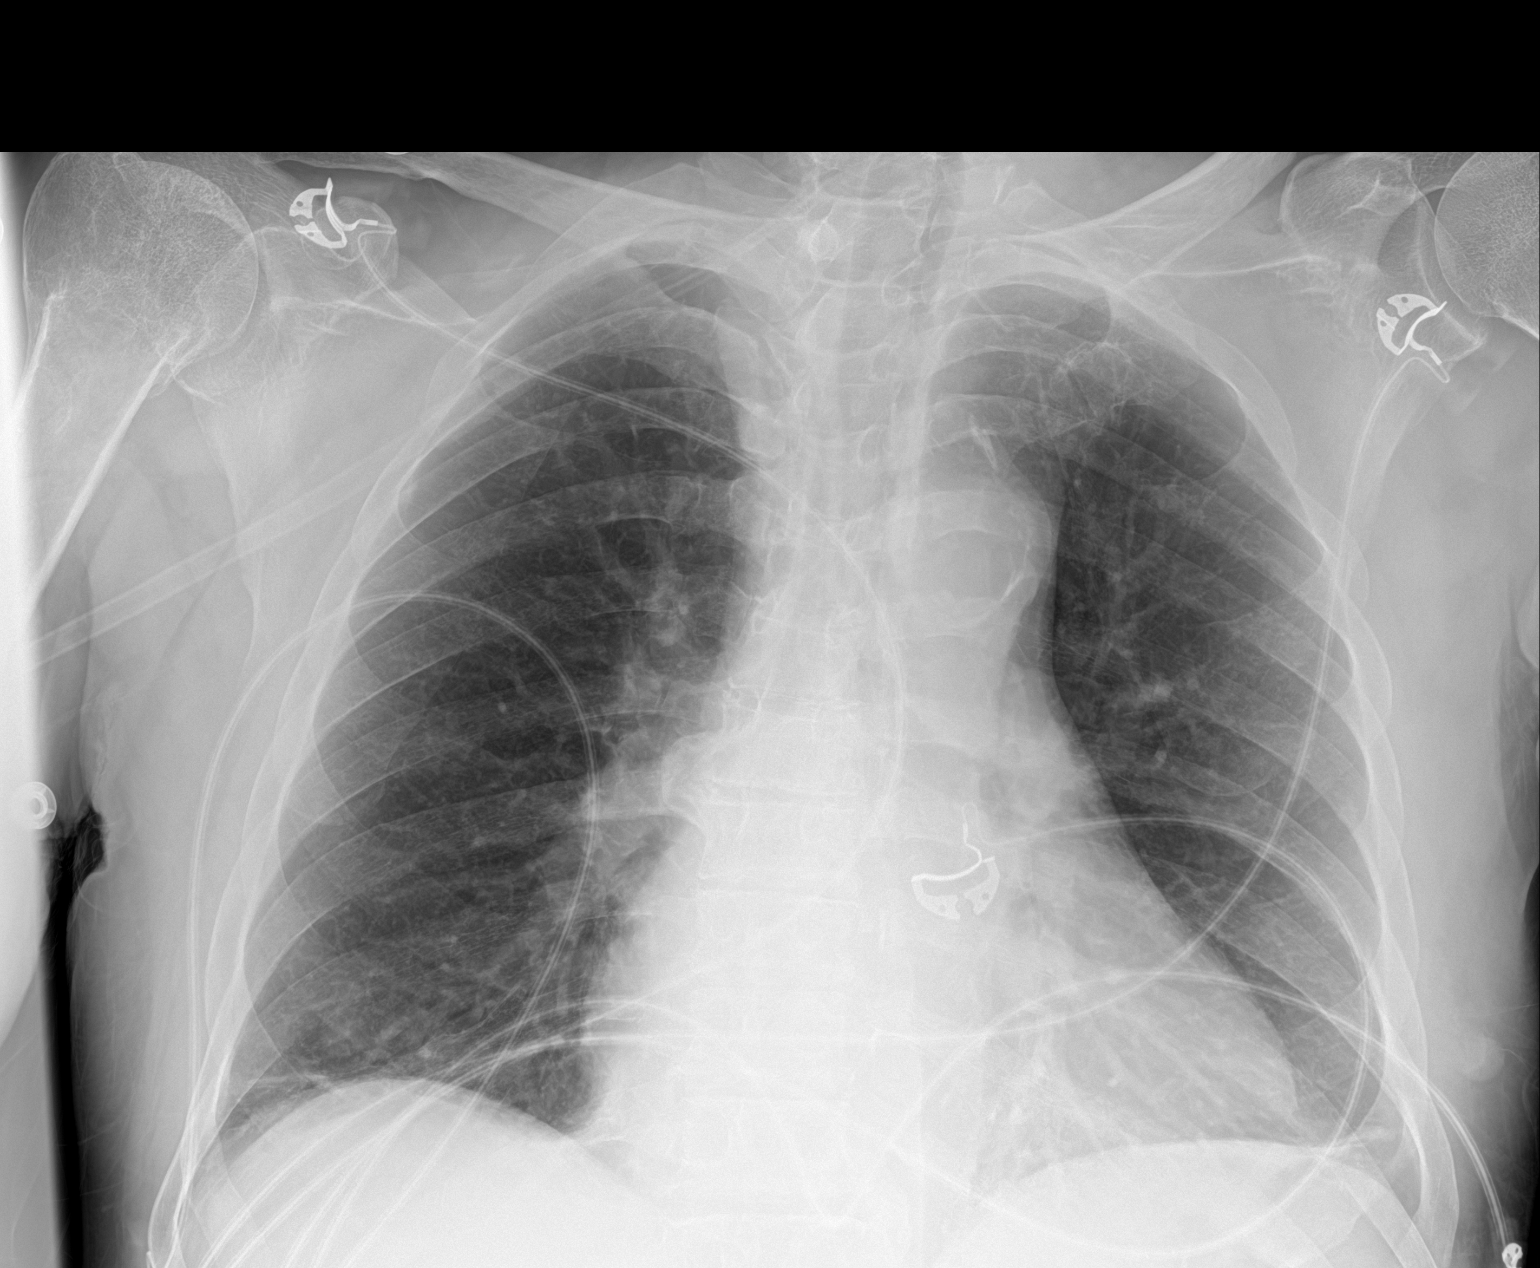

[1 of 1 positions shown; findings below may reference images not displayed]

FINDINGS: Stable cardiomediastinal silhouette with mild cardiomegaly. No
pneumothorax. No pleural effusion. No pulmonary edema. Mild
curvilinear opacities at the costophrenic angles.
IMPRESSION: 1. Mild cardiomegaly without pulmonary edema.
2. Mild scarring versus atelectasis at the costophrenic angles.

## 2018-04-15 MED ORDER — CHLORHEXIDINE GLUCONATE CLOTH 2 % EX PADS
6.0000 | MEDICATED_PAD | Freq: Every day | CUTANEOUS | Status: DC
  Administered 2018-04-16 – 2018-04-18 (×3): 6 via TOPICAL

## 2018-04-15 MED ORDER — NITROGLYCERIN 0.4 MG SL SUBL: 0.4000 mg | SUBLINGUAL_TABLET | SUBLINGUAL | Status: DC | PRN

## 2018-04-15 MED ORDER — SODIUM CHLORIDE 0.9 % IV BOLUS
250.0000 mL | Freq: Once | INTRAVENOUS | Status: AC | PRN
  Administered 2018-04-15: 250 mL via INTRAVENOUS

## 2018-04-15 MED ORDER — AMIODARONE LOAD VIA INFUSION
150.0000 mg | Freq: Once | INTRAVENOUS | Status: AC
  Administered 2018-04-15: 150 mg via INTRAVENOUS

## 2018-04-15 MED ORDER — AMIODARONE HCL IN DEXTROSE 360-4.14 MG/200ML-% IV SOLN
60.0000 mg/h | INTRAVENOUS | Status: DC
  Administered 2018-04-15 (×2): 60 mg/h via INTRAVENOUS
  Filled 2018-04-15: qty 200

## 2018-04-15 MED ORDER — SODIUM CHLORIDE 0.9 % IV BOLUS
250.0000 mL | Freq: Once | INTRAVENOUS | Status: AC
  Administered 2018-04-15: 250 mL via INTRAVENOUS

## 2018-04-15 MED ORDER — AMIODARONE HCL IN DEXTROSE 360-4.14 MG/200ML-% IV SOLN
30.0000 mg/h | INTRAVENOUS | Status: DC
  Administered 2018-04-15 – 2018-04-18 (×7): 30 mg/h via INTRAVENOUS
  Filled 2018-04-15 (×6): qty 200

## 2018-04-15 MED ORDER — AMIODARONE HCL IN DEXTROSE 360-4.14 MG/200ML-% IV SOLN
INTRAVENOUS | Status: AC
  Filled 2018-04-15: qty 200

## 2018-04-15 NOTE — Progress Notes (Addendum)
Pt with several bursts of HR in 170's nonsustained. Dagoberto Ligas PA aware. RR aware. 1mg  morphine given for CP.  Clyde Canterbury, RN

## 2018-04-15 NOTE — Progress Notes (Signed)
OT Cancellation Note  Patient Details Name: MACHI WHITTAKER MRN: [id number] DOB: 1955/07/28   Cancelled Treatment:    Reason Eval/Treat Not Completed: Medical issues which prohibited therapy.  Pt with CP, increased overall pain, and HR up to 170.  Will defer OT today, and will try back.  Lucille Passy, OTR/L Acute Rehabilitation Services Pager [phone] Office [phone]   Lucille Passy M 04/15/2018, 9:45 AM

## 2018-04-15 NOTE — Consult Note (Addendum)
Cardiology Consult    Patient ID: Raymond [name]; [id number]; 05-Nov-1955   Admit date: 04/11/2018 Date of Consult: 04/15/2018  Primary Care Provider: Scot Jun, FNP Primary Cardiologist: Jenkins Rouge, MD   Patient Profile    Raymond [name] is a 62 y.o. male with past medical history of PVD, ESRD (HD - MWF), Hepatitis C, HTN, HLD, COPD, chronic anemia and tobacco use who is being seen today for the evaluation of chest pain at the request of Dr. Scot Dock.   History of Present Illness    Mr. Thielen was recently evaluated by Cecilie Kicks, NP on 03/27/2018 and reported occasional episodes of chest pain and dyspnea on exertion when undergoing dialysis. Also noted exertional symptoms when walking up an incline. Given his presenting symptoms and the need for cardiac clearance prior to femoropopliteal bypass, a cardiac catheterization was recommended for definitive evaluation. This was performed by Dr. Saunders Revel on 03/29/2018 and showed 20 to 30% stenosis along the LAD, luminal irregularities along the RCA, and angiographically normal LCx. Medical therapy and risk factor modification were recommended of his nonobstructive CAD.  He presented to [facility] on 04/11/2018 for planned left femoropopliteal bypass. He was noted to have a potassium of 7.5 the morning following surgery and underwent dialysis later that day. He underwent dialysis again on 67/12/1948 with no complications noted at that time. Postoperative ABI's were WNL.   Starting earlier this morning, he developed chest pain at rest and was noted to be tachycardiac. Labs this AM show WBC 5.4, Hgb 9.3, platelets 141, Na+ 134, K+ 5.1, and creatinine 10.41. EKG shows sinus tachycardia, HR 113, PAC's, and LVH with up-sloping of the ST segment along the inferolateral leads (similar to prior tracings). SBP in 70-80s.  In talking with the patient, he reports overall feeling weak. His main complaint is continual pain down his left leg. He also mentions  intermittent chest discomfort starting this morning which he describes as a pressure. Reports associated palpitations and dyspnea. No recent orthopnea or PND.    Past Medical History:  Diagnosis Date  . Anemia   . COPD (chronic obstructive pulmonary disease) ([place])   . Dyspnea    "with too much fluid"  . ESRD (end stage renal disease) on dialysis [facility])    "MWF; [facility]" (07/22/17)  . Hemodialysis patient ([place])   . Hepatitis C    Still positive s/p liver biopsy at [facility]  and interferon therapy for 6 months. Most recent lab work was on 10/24/12  . Hepatitis C    "took the tx; gone now" (12/05/2016)  Was treated  . History of blood transfusion ~ 2012/2013   "related to my kidneys; blood was low"  . Hypertension   . Substance abuse ([place])   . Thyroid disease     Past Surgical History:  Procedure Laterality Date  . ABDOMINAL AORTOGRAM W/LOWER EXTREMITY N/A 02/08/2018   Procedure: ABDOMINAL AORTOGRAM W/LOWER EXTREMITY;  Surgeon: Conrad Grayhawk, MD;  Location: [place] CV LAB;  Service: Cardiovascular;  Laterality: N/A;  . AV FISTULA PLACEMENT Left Aug. 2013 ?  . FEMORAL-POPLITEAL BYPASS GRAFT Left 04/11/2018   Procedure: BYPASS GRAFT FEMORAL-POPLITEAL ARTERY LEFT LEG;  Surgeon: Rosetta Posner, MD;  Location: [place];  Service: Vascular;  Laterality: Left;  . LEFT HEART CATH AND CORONARY ANGIOGRAPHY N/A 03/29/2018   Procedure: LEFT HEART CATH AND CORONARY ANGIOGRAPHY;  Surgeon: Nelva Bush, MD;  Location: [place] CV LAB;  Service: Cardiovascular;  Laterality: N/A;  .  LIVER BIOPSY    . ORIF ULNAR FRACTURE Left 05/18/2017   Procedure: OPEN REDUCTION INTERNAL FIXATION (ORIF) ULNAR FRACTURE;  Surgeon: Iran Planas, MD;  Location: [place];  Service: Orthopedics;  Laterality: Left;  . SHUNTOGRAM N/A 12/11/2012   Procedure: Earney Mallet;  Surgeon: Serafina Mitchell, MD;  Location: [facility] CATH LAB;  Service: Cardiovascular;  Laterality: N/A;     Home Medications:  Prior to Admission medications     Medication Sig Start Date End Date Taking? Authorizing Provider  acetaminophen (TYLENOL) 325 MG tablet Take 2 tablets (650 mg total) by mouth every 6 (six) hours as needed. Patient taking differently: Take 650 mg by mouth every 6 (six) hours as needed for mild pain.  07/22/17  Yes Varney Biles, MD  albuterol (PROVENTIL HFA;VENTOLIN HFA) 108 (90 Base) MCG/ACT inhaler Inhale 2 puffs into the lungs every 6 (six) hours as needed for wheezing or shortness of breath. 12/06/16  Yes Lorella Nimrod, MD  amLODipine (NORVASC) 5 MG tablet Take 1 tablet (5 mg total) by mouth daily. 09/12/17  Yes Scot Jun, FNP  aspirin EC 81 MG tablet Take 81 mg by mouth daily.   Yes [provider]  atorvastatin (LIPITOR) 40 MG tablet Take 1 tablet (40 mg total) by mouth daily. 03/27/18 06/25/18 Yes Isaiah Serge, NP  cinacalcet (SENSIPAR) 30 MG tablet Take 30 mg by mouth daily.  10/12/15  Yes [provider]  gabapentin (NEURONTIN) 300 MG capsule TAKE 1 CAPSULE BY MOUTH AT BEDTIME AS NEEDED FOR SLEEP Patient taking differently: Take 300 mg by mouth at bedtime as needed (sleep).  09/12/17  Yes Scot Jun, FNP  metoprolol succinate (TOPROL-XL) 25 MG 24 hr tablet Take 2 tablets (50 mg total) by mouth daily. 07/13/17  Yes Scot Jun, FNP  ondansetron (ZOFRAN) 4 MG tablet Take 1 tablet (4 mg total) by mouth every 8 (eight) hours as needed for nausea or vomiting. 04/03/18  Yes Thurnell Lose, MD  pantoprazole (PROTONIX) 40 MG tablet Take 1 tablet (40 mg total) by mouth daily. 04/03/18  Yes Thurnell Lose, MD  sevelamer (RENAGEL) 800 MG tablet Take 4,800 mg by mouth 3 (three) times daily with meals.    Yes [provider]    Inpatient Medications: Scheduled Meds: . aspirin EC  81 mg Oral Daily  . atorvastatin  40 mg Oral Daily  . calcitRIOL  0.75 mcg Oral Q M,W,F-HD  . calcium carbonate  400 mg of elemental calcium Oral BID BM  . Chlorhexidine Gluconate Cloth  6 each Topical  Q0600  . docusate sodium  100 mg Oral Daily  . heparin  5,000 Units Subcutaneous Q8H  . multivitamin  1 tablet Oral QHS  . pantoprazole  40 mg Oral Daily  . sevelamer carbonate  2,400 mg Oral TID WC   Continuous Infusions: . sodium chloride    . magnesium sulfate 1 - 4 g bolus IVPB     PRN Meds: sodium chloride, acetaminophen **OR** acetaminophen, albuterol, bisacodyl, gabapentin, guaiFENesin-dextromethorphan, labetalol, magnesium sulfate 1 - 4 g bolus IVPB, morphine injection, nitroGLYCERIN, ondansetron, ondansetron, oxyCODONE-acetaminophen, phenol, polyethylene glycol  Allergies:   No Known Allergies  Social History:   Social History   Socioeconomic History  . Marital status: Single    Spouse name: Not on file  . Number of children: Not on file  . Years of education: Not on file  . Highest education level: Not on file  Occupational History  . Not on file  Social Needs  . Financial resource strain: Not on file  . Food insecurity:    Worry: Not on file    Inability: Not on file  . Transportation needs:    Medical: Not on file    Non-medical: Not on file  Tobacco Use  . Smoking status: Current Every Day Smoker    Packs/day: 1.00    Years: 25.00    Pack years: 25.00    Types: Cigarettes    Last attempt to quit: 08/01/2011    Years since quitting: 6.7  . Smokeless tobacco: Never Used  Substance and Sexual Activity  . Alcohol use: Yes    Comment: occasionally  . Drug use: Yes    Types: Cocaine    Comment: clean x approx 2 months (07/22/17)  . Sexual activity: Not on file  Lifestyle  . Physical activity:    Days per week: Not on file    Minutes per session: Not on file  . Stress: Not on file  Relationships  . Social connections:    Talks on phone: Not on file    Gets together: Not on file    Attends religious service: Not on file    Active member of club or organization: Not on file    Attends meetings of clubs or organizations: Not on file    Relationship  status: Not on file  . Intimate partner violence:    Fear of current or ex partner: Not on file    Emotionally abused: Not on file    Physically abused: Not on file    Forced sexual activity: Not on file  Other Topics Concern  . Not on file  Social History Narrative  . Not on file     Family History:    Family History  Problem Relation Age of Onset  . Diabetes Father   . Hypertension Father   . Heart disease Father       Review of Systems    General:  No chills, fever, night sweats or weight changes. Positive for left leg pain.  Cardiovascular:  No dyspnea on exertion, edema, orthopnea, palpitations, paroxysmal nocturnal dyspnea. Positive for chest pain.  Dermatological: No rash, lesions/masses Respiratory: No cough, dyspnea Urologic: No hematuria, dysuria Abdominal:   No nausea, vomiting, diarrhea, bright red blood per rectum, melena, or hematemesis Neurologic:  No visual changes, wkns, changes in mental status. All other systems reviewed and are otherwise negative except as noted above.  Physical Exam/Data    Vitals:   04/15/18 0748 04/15/18 0800 04/15/18 0830 04/15/18 0846  BP: (!) 90/59 (!) 89/55 (!) 85/61 (!) 87/68  Pulse: (!) 113 (!) 114 (!) 108 (!) 108  Resp: 17 14 14 16   Temp: 98.8 F (37.1 C)     TempSrc: Oral     SpO2: 98% 99% 99% 100%  Weight:      Height:        Intake/Output Summary (Last 24 hours) at 04/15/2018 0901 Last data filed at 04/14/2018 2130 Gross per 24 hour  Intake 380 ml  Output -  Net 380 ml   Filed Weights   04/12/18 1209 04/13/18 0750 04/13/18 1200  Weight: 73 kg 73.2 kg 72.2 kg   Body mass index is 24.2 kg/m.   General: Pleasant, African American male. Weak appearing in NAD Psych: Normal affect. Neuro: Alert and oriented X 3. Moves all extremities spontaneously. HEENT: Normal  Neck: Supple without bruits or JVD. Lungs:  Resp regular and unlabored, CTA without wheezing  or rales . Heart: Irregularly irregular, tachycardiac.  No s3, s4, or murmurs. Abdomen: Soft, non-tender, non-distended, BS + x 4.  Extremities: No clubbing, cyanosis. Post-op changes on left with mild edema. Wound ok  DP/PT/Radials 2+ and equal bilaterally.  Labs/Studies     Relevant CV Studies:  Echocardiogram: 11/2016 Study Conclusions  - Left ventricle: The cavity size was normal. Wall thickness was   increased in a pattern of moderate LVH. Systolic function was   normal. The estimated ejection fraction was in the range of 55%   to 60%. Wall motion was normal; there were no regional wall   motion abnormalities. Doppler parameters are consistent with   abnormal left ventricular relaxation (grade 1 diastolic   dysfunction). The E/e&' ratio is between 8-15, suggesting   indeterminate LV filling pressure. - Aorta: Ascending aortic diameter: 42 mm (S). - Ascending aorta: The ascending aorta was mildly dilated. - Mitral valve: Calcified annulus. There was trivial regurgitation. - Left atrium: Moderately dilated. - Right atrium: The atrium was mildly dilated. - Atrial septum: Mobile IAS - cannot exclude small PFO. - Tricuspid valve: There was mild regurgitation. - Pulmonary arteries: PA peak pressure: 47 mm Hg (S). - Systemic veins: The IVC measures >2.1 cm, but collapses more than   50%, suggesting an elevated RA pressure of 8 mmHg.  Impressions:  - LVEF 55-60%, moderate LVH, normal wall motion, grade 1 DD with   indeterminate LV filling pressure, mildly dilated ascending aorta   to 4.2 cm, trivial MR, moderate LAE, mild RAE, mobile interatrial   septum, mild TR, RVSP 47 mmHg, dilated IVC suggestive of elevated   RA pressure of 8 mmHg.  Cardiac Catheterization: 03/29/2018     Conclusions: 1. Mild, non-obstructive coronary artery disease. 2. Normal left ventricular systolic function with upper normal left ventricular filling pressure.  Recommendations: 1. Medical therapy and risk factor modification to prevent progression  of disease. 2. Ok to proceed with vascular surgery from cardiac standpoint. 3. Hemodialysis tomorrow, as scheduled.  Recommend Aspirin 81mg  daily for peripheral vascular disease.  Laboratory Data:  Chemistry Recent Labs  Lab 04/12/18 0346 04/13/18 0338 04/15/18 0727  NA 136 137 134*  K >7.5* 5.4* 5.1  CL 96* 96* 94*  CO2 23 30 28   GLUCOSE 131* 99 103*  BUN 68* 43* 50*  CREATININE 13.79* 9.24* 10.41*  CALCIUM 6.6* 7.9* 9.0  GFRNONAA 3* 5* 5*  GFRAA 4* 6* 5*  ANIONGAP 17* 11 12    Recent Labs  Lab 04/11/18 0835 04/13/18 0338  PROT 7.3  --   ALBUMIN 3.2* 2.9*  AST 19  --   ALT 10  --   ALKPHOS 58  --   BILITOT 0.5  --    Hematology Recent Labs  Lab 04/11/18 0835 04/12/18 0346 04/13/18 0834  WBC 3.8* 3.1* 3.5*  RBC 3.48* 3.19* 2.93*  HGB 11.4* 10.4* 9.6*  HCT 38.3* 34.7* 31.7*  MCV 110.1* 108.8* 108.2*  MCH 32.8 32.6 32.8  MCHC 29.8* 30.0 30.3  RDW 15.5 15.2 15.3  PLT 155 159 141*   Cardiac EnzymesNo results for input(s): TROPONINI in the last 168 hours. No results for input(s): TROPIPOC in the last 168 hours.  BNPNo results for input(s): BNP, PROBNP in the last 168 hours.  DDimer No results for input(s): DDIMER in the last 168 hours.  Radiology/Studies:  No results found.   Assessment & Plan    1. New-Onset Atrial Fibrillation with RVR - the patient developed chest discomfort with  associated palpitations earlier this morning, noted to have intermittent tachycardia on telemetry with HR peaking into the 170's, initially most consistent with bursts of SVT. PTA Toprol-XL 50mg  daily was held starting 10/4 due to hypotension. TSH 2.47 in 03/2018. K+ 5.1 and Hgb 9.3 this AM.  - repeat EKG obtained during this encounter and confirms atrial fibrillation with RVR. He is currently hypotensive with SBP in the 80's (did receive IV Morphine at 0851). Reviewed with Dr. Haroldine Laws and will start IV Amiodarone for rate-control.  - This patients CHA2DS2-VASc Score and  unadjusted Ischemic Stroke Rate (% per year) is equal to 2.2 % stroke rate/year from a score of 2 (HTN, Vascular). Currently on DVT prophylaxis. If he converts to NSR and this is a short-course of post-operative atrial fibrillation, he would not be committed to long-term anticoagulation. If he does not convert back to NSR, would need to consider initiation of this once safe from a surgical perspective. Continue to follow on telemetry.   2. Chest Pain - suspect this is secondary to his tachycardia given the onset of symptoms and telemetry findings. EKG shows up-sloping of the ST segments along the inferolateral leads but is overall similar to prior tracings. Given that his recent cardiac catheterization on 03/29/2018 showed 20 to 30% stenosis along the LAD, luminal irregularities along the RCA, and angiographically normal LCx, would not anticipate further ischemic evaluation at this time.  - remains on ASA and statin therapy.   3. HTN - currently hypotensive with SBP in the 80's as outlined above. Continue to hold PTA Amlodipine 5mg  daily and Toprol-XL 50mg  daily.   4. ESRD - on HD - MWF.  - Nephrology following.   5. PVD - s/p left femoropopliteal bypass on 04/11/2018. - per admitting team.    For questions or updates, please contact [place] Please consult [email or web address] for contact info under Cardiology/STEMI.  Signed, Erma Heritage, PA-C 04/15/2018, 9:01 AM Pager: [phone]  Patient seen and examined with the above-signed Advanced Practice Provider and/or Housestaff. I personally reviewed laboratory data, imaging studies and relevant notes. I independently examined the patient and formulated the important aspects of the plan. I have edited the note to reflect any of my changes or salient points. I have personally discussed the plan with the patient and/or family.  62 y/o male with ESRD, HTN and PAD. Recently underwent cath with minimal non-obstructive CAD. Echo with normal  EE. He is 4 days post-op and developed hypotension and CP. Tele shows new-onset AF with RVR. He was started on amio and now back in sinus tach. CP resolved but still weak. SBP in 80s. Will continue amio. Hydrate with 250cc NS as po intake has been poor.   He is at high-risk for post-op DVT/PE. If not improved with restoration of NSR and IVF would consider w/u for PE/DVT. Continue DVT prophylaxis dose of heparin. If AF recurs would consider systemic anticoagulation. Will repeat echo.   Glori Bickers, MD  12:15 PM

## 2018-04-15 NOTE — Progress Notes (Addendum)
[facility] Progress Note  Subjective: doing better, not getting OOB much  Vitals:   04/15/18 0930 04/15/18 0947 04/15/18 1000 04/15/18 1027  BP: (!) 89/58 (!) 81/66 (!) 81/65 (!) 87/59  Pulse: (!) 106 (!) 165    Resp: 15 (!) 25 (!) 31 18  Temp:      TempSrc:      SpO2: 100% 100% 100% 100%  Weight:      Height:        Inpatient medications: . aspirin EC  81 mg Oral Daily  . atorvastatin  40 mg Oral Daily  . calcitRIOL  0.75 mcg Oral Q M,W,F-HD  . calcium carbonate  400 mg of elemental calcium Oral BID BM  . Chlorhexidine Gluconate Cloth  6 each Topical Q0600  . docusate sodium  100 mg Oral Daily  . heparin  5,000 Units Subcutaneous Q8H  . multivitamin  1 tablet Oral QHS  . pantoprazole  40 mg Oral Daily  . sevelamer carbonate  2,400 mg Oral TID WC   . sodium chloride    . amiodarone 60 mg/hr (04/15/18 1024)   Followed by  . amiodarone    . magnesium sulfate 1 - 4 g bolus IVPB    . sodium chloride 250 mL (04/15/18 1303)   sodium chloride, acetaminophen **OR** acetaminophen, albuterol, bisacodyl, gabapentin, guaiFENesin-dextromethorphan, labetalol, magnesium sulfate 1 - 4 g bolus IVPB, morphine injection, nitroGLYCERIN, ondansetron, ondansetron, oxyCODONE-acetaminophen, phenol, polyethylene glycol  Iron/TIBC/Ferritin/ %Sat    Component Value Date/Time   IRON 62 06/20/2017 1207   TIBC 353 06/20/2017 1207   FERRITIN 76 06/21/2017 0451   IRONPCTSAT 18 06/20/2017 1207    Exam: General: WDWN male NAD  Head: NCAT sclera not icteric MMM Neck: Supple. No JVD No masses Lungs: CTA bilaterally  Heart: RRR with S1 S2 Abdomen: soft NT + BS Lower extremities: long popliteal incsion LLE mild drainage, 1-2+ edema left leg Neuro: A & O  X 3.  Psych:  Responds to questions appropriately with a normal affect. Dialysis Access: LUE AVF cannulated on HD   Dialysis: [facility] MWF  4h  71.5kg  2/2 bath  P4  Hep 2000  L AVF -Sensipar 180 mg PO TIW -Calcitriol 0.75 mcg PO  TIW -Mircera 100 mcg IV q 2 weeks (last dose 9/27) -Venofer 100 mg IV  q HD until 10/4  - amlodipine 5 qd/ metoprolol xl 50 qd  Assessment/Plan: 1. PAD s/p L fem-pop bypass per Dr. Donnetta Hutching 10/2 - per primary team. 2. L leg pain/ swelling - doppler neg for DVT 3. New afib - possibly postop, on IV amio now. BP's soft 4. Hyperkalemia - better, 4.9 5. ESRD -  HD MWF. HD today.  6. Hypertension/volume  - BP's low, holding home metop/amlodipine  7. Anemia  - Hgb 9's now, stable. On ESA as outpatient follow trends. Continue Fe bolus x 1  8. Metabolic bone disease -  Ca low. Continue Calcitriol. D/C Sensipar. Use added Ca bath next HD. Add Ca carbonate between meals.  9. Nutrition - Renal diet/fluid restriction/vitamins.    Kelly Splinter MD [facility] pager [phone]   04/15/2018, 1:16 PM   Recent Labs  Lab 04/11/18 0835  04/13/18 0338 04/15/18 0727  NA 139   < > 137 134*  K 5.5*   < > 5.4* 5.1  CL 94*   < > 96* 94*  CO2 29   < > 30 28  GLUCOSE 73   < > 99 103*  BUN 46*   < > 43* 50*  CREATININE 12.09*   < > 9.24* 10.41*  CALCIUM 7.7*   < > 7.9* 9.0  PHOS  --   --  6.3*  --   ALBUMIN 3.2*  --  2.9*  --   INR 1.16  --   --   --    < > = values in this interval not displayed.   Recent Labs  Lab 04/11/18 0835  AST 19  ALT 10  ALKPHOS 58  BILITOT 0.5  PROT 7.3   Recent Labs  Lab 04/13/18 0834 04/15/18 0727  WBC 3.5* 5.4  HGB 9.6* 9.3*  HCT 31.7* 30.3*  MCV 108.2* 106.7*  PLT 141* 141*

## 2018-04-15 NOTE — Significant Event (Signed)
Rapid Response Event Note  Overview: Time Called: 0924 Arrival Time: 0926 Event Type: Cardiac, Hypotension  Initial Focused Assessment: Patient sp Fem Pop, per RN slow to progress. This am patient complaining of chest pain 5/10, "feels like something is sitting on my chest" He is also ST with PACs and now with runs of SVT RN placed patient on [place] O2, 12 lead EKG done, morphine given for pain. BP 80s/60s, no NTG given bc BP  Interventions: SVT becoming more frequent.  250cc NS bolus given IV [place] PA with cards at bedside to assess patient Now RAF 120-140s 12 lead EKG done PCXR done BP 87/59  AF 162 Amiodarone bolus and gtt started    Plan of Care (if not transferred): RN to call if assistance needed  Event Summary: Name of Physician Notified: Raymond [name] at [phone]  Name of Consulting Physician Notified: Cardiology at [place] in room and stabalized  Event End Time: [address]

## 2018-04-15 NOTE — Progress Notes (Signed)
VASCULAR LAB PRELIMINARY  PRELIMINARY  PRELIMINARY  PRELIMINARY  Left lower extremity venous duplex completed.    Preliminary report:  There is no obvious evidence of DVT or SVT noted in the visualized veins of the left lower extremity.   Wyolene Weimann, RVT 04/15/2018, 3:08 PM

## 2018-04-15 NOTE — Progress Notes (Addendum)
   VASCULAR SURGERY ASSESSMENT & PLAN:   4 Days Post-Op s/p: Left femoral to below-knee popliteal artery bypass: His bypass graft is patent.  He has made very slow progress with PTx.   CHEST PAIN:  This morning he had an episode of tachycardia and upon further questioning admitted to some substernal chest pressure which he did not mention earlier.  We will place him on oxygen, give him sublingual nitroglycerin, and consult cardiology. EKG was unremarkable.   TACHYCARDIA: HR is currently 105. Pain well controlled. No evidence of sepsis. Reluctant to give much fluid with his ESRD.Will get CXR.   END-STAGE RENAL DISEASE: He is a Monday Wednesday Friday dialysis.  SUBJECTIVE:   Complains of chest pressure this morning.  PHYSICAL EXAM:   Vitals:   04/15/18 0000 04/15/18 0400 04/15/18 0748 04/15/18 0800  BP: 103/73 97/71 (!) 90/59 (!) 89/55  Pulse:  (!) 103 (!) 113 (!) 114  Resp: 13 16 17 14   Temp: 99.2 F (37.3 C) 98.7 F (37.1 C) 98.8 F (37.1 C)   TempSrc: Oral  Oral   SpO2: 98% 97% 98% 99%  Weight:      Height:       Slightly diminished left dorsalis pedis pulse but his pressure is down some.  He has a biphasic dorsalis pedis signal with the Doppler. Lungs: decreased BS at bases.   LABS:   AM labs: Hgb = 9.3, WBC = 5.4, K = 5.1  PROBLEM LIST:    Active Problems:   PAD (peripheral artery disease) (HCC)   CURRENT MEDS:   . aspirin EC  81 mg Oral Daily  . atorvastatin  40 mg Oral Daily  . calcitRIOL  0.75 mcg Oral Q M,W,F-HD  . calcium carbonate  400 mg of elemental calcium Oral BID BM  . Chlorhexidine Gluconate Cloth  6 each Topical Q0600  . docusate sodium  100 mg Oral Daily  . heparin  5,000 Units Subcutaneous Q8H  . multivitamin  1 tablet Oral QHS  . pantoprazole  40 mg Oral Daily  . sevelamer carbonate  2,400 mg Oral TID [facility]    Deitra Mayo Beeper: [phone] Office: [phone] 04/15/2018

## 2018-04-15 NOTE — Progress Notes (Signed)
Contacted Cardiology Ernesto Rutherford PA and Dr. Haroldine Laws. Dr. Mahalia Longest told RN to contact the surgeon again. RN paged Dagoberto Ligas PA. Pt BP 84/61, HR 108, O2 99 on 2L [place]. Pt still with active CP. RR to see pt. Will continue to monitor.  Clyde Canterbury, RN

## 2018-04-15 NOTE — Progress Notes (Signed)
Pt states 5/10 chest pain that feels like "someone is pushing down on my chest." BP 87/64, HR 114, O2- 95 on RA. Dr.Dickson at bedside. 12 lead EKG performed. Pt placed on 2L [place]. States slight improvement in chest pain. Will continue to monitor.  Clyde Canterbury, RN

## 2018-04-16 ENCOUNTER — Inpatient Hospital Stay (HOSPITAL_COMMUNITY): Payer: Medicare Other

## 2018-04-16 DIAGNOSIS — I25110 Atherosclerotic heart disease of native coronary artery with unstable angina pectoris: Secondary | ICD-10-CM

## 2018-04-16 LAB — ECHOCARDIOGRAM COMPLETE
Height: 68 in
Weight: 2631.41 oz

## 2018-04-16 LAB — RENAL FUNCTION PANEL
ANION GAP: 16 — AB (ref 5–15)
Albumin: 2.5 g/dL — ABNORMAL LOW (ref 3.5–5.0)
BUN: 71 mg/dL — ABNORMAL HIGH (ref 8–23)
CALCIUM: 9.1 mg/dL (ref 8.9–10.3)
CHLORIDE: 92 mmol/L — AB (ref 98–111)
CO2: 25 mmol/L (ref 22–32)
Creatinine, Ser: 12.75 mg/dL — ABNORMAL HIGH (ref 0.61–1.24)
GFR calc Af Amer: 4 mL/min — ABNORMAL LOW (ref >60)
GFR calc non Af Amer: 4 mL/min — ABNORMAL LOW (ref >60)
GLUCOSE: 103 mg/dL — AB (ref 70–99)
Phosphorus: 7.2 mg/dL — ABNORMAL HIGH (ref 2.5–4.6)
Potassium: 4.9 mmol/L (ref 3.5–5.1)
SODIUM: 133 mmol/L — AB (ref 135–145)

## 2018-04-16 LAB — CBC
HCT: 28.8 % — ABNORMAL LOW (ref 39.0–52.0)
Hemoglobin: 8.6 g/dL — ABNORMAL LOW (ref 13.0–17.0)
MCH: 31.9 pg (ref 26.0–34.0)
MCHC: 29.9 g/dL — ABNORMAL LOW (ref 30.0–36.0)
MCV: 106.7 fL — ABNORMAL HIGH (ref 78.0–100.0)
Platelets: 156 K/uL (ref 150–400)
RBC: 2.7 MIL/uL — ABNORMAL LOW (ref 4.22–5.81)
RDW: 15.5 % (ref 11.5–15.5)
WBC: 5.4 K/uL (ref 4.0–10.5)

## 2018-04-16 LAB — GLUCOSE, CAPILLARY
Glucose-Capillary: 90 mg/dL (ref 70–99)
Glucose-Capillary: 94 mg/dL (ref 70–99)
Glucose-Capillary: 109 mg/dL — ABNORMAL HIGH (ref 70–99)

## 2018-04-16 LAB — HEPARIN LEVEL (UNFRACTIONATED): Heparin Unfractionated: 0.28 IU/mL — ABNORMAL LOW (ref 0.30–0.70)

## 2018-04-16 MED ORDER — HEPARIN BOLUS VIA INFUSION
4000.0000 [IU] | Freq: Once | INTRAVENOUS | Status: AC
  Administered 2018-04-16: 4000 [IU] via INTRAVENOUS
  Filled 2018-04-16: qty 4000

## 2018-04-16 MED ORDER — HEPARIN SODIUM (PORCINE) 1000 UNIT/ML DIALYSIS
2000.0000 [IU] | Freq: Once | INTRAMUSCULAR | Status: AC
  Administered 2018-04-16: 2000 [IU] via INTRAVENOUS_CENTRAL

## 2018-04-16 MED ORDER — METOPROLOL SUCCINATE ER 25 MG PO TB24
25.0000 mg | ORAL_TABLET | Freq: Every day | ORAL | Status: DC
  Administered 2018-04-16 – 2018-04-17 (×2): 25 mg via ORAL
  Filled 2018-04-16 (×3): qty 1

## 2018-04-16 MED ORDER — CALCITRIOL 0.25 MCG PO CAPS
ORAL_CAPSULE | ORAL | Status: AC
  Filled 2018-04-16: qty 1

## 2018-04-16 MED ORDER — WARFARIN - PHARMACIST DOSING INPATIENT
Freq: Every day | Status: DC
  Administered 2018-04-16: 18:00:00

## 2018-04-16 MED ORDER — HEPARIN (PORCINE) IN NACL 100-0.45 UNIT/ML-% IJ SOLN
1550.0000 [IU]/h | INTRAMUSCULAR | Status: DC
  Administered 2018-04-16: 1050 [IU]/h via INTRAVENOUS
  Administered 2018-04-17: 1200 [IU]/h via INTRAVENOUS
  Administered 2018-04-18: 1550 [IU]/h via INTRAVENOUS
  Administered 2018-04-18: 1300 [IU]/h via INTRAVENOUS
  Filled 2018-04-16 (×4): qty 250

## 2018-04-16 MED ORDER — CALCITRIOL 0.5 MCG PO CAPS
ORAL_CAPSULE | ORAL | Status: AC
  Filled 2018-04-16: qty 1

## 2018-04-16 MED ORDER — WARFARIN SODIUM 5 MG PO TABS
5.0000 mg | ORAL_TABLET | Freq: Once | ORAL | Status: AC
  Administered 2018-04-16: 5 mg via ORAL
  Filled 2018-04-16 (×2): qty 1

## 2018-04-16 MED ORDER — HEPARIN SODIUM (PORCINE) 1000 UNIT/ML IJ SOLN
INTRAMUSCULAR | Status: AC
  Administered 2018-04-16: 2000 [IU] via INTRAVENOUS_CENTRAL
  Filled 2018-04-16: qty 2

## 2018-04-16 NOTE — Progress Notes (Addendum)
ANTICOAGULATION CONSULT NOTE - Initial Consult  Pharmacy Consult for warfarin and heparin IV Indication: atrial fibrillation  No Known Allergies  Patient Measurements: Height: 5\' 8"  (172.7 cm) Weight: 164 lb 3.9 oz (74.5 kg) IBW/kg (Calculated) : 68.4  Heparin dosing weight 74.5kg    Vital Signs: Temp: 98.1 F (36.7 C) (10/07 0735) Temp Source: Oral (10/07 0735) BP: 123/54 (10/07 1030) Pulse Rate: 107 (10/07 1030)  Labs: Recent Labs    04/15/18 0727 04/16/18 0749  HGB 9.3* 8.6*  HCT 30.3* 28.8*  PLT 141* 156  CREATININE 10.41* 12.75*    Estimated Creatinine Clearance: 5.7 mL/min (A) (by C-G formula based on SCr of 12.75 mg/dL (H)).   Medical History: Past Medical History:  Diagnosis Date  . Anemia   . CAD (coronary artery disease)    a. 03/2018: cath showing 20 to 30% stenosis along the LAD, luminal irregularities along the RCA, and angiographically normal LCx  . COPD (chronic obstructive pulmonary disease) ([place])   . Dyspnea    "with too much fluid"  . ESRD (end stage renal disease) on dialysis [facility])    "MWF; Jeneen Rinks" (07/22/17)  . Hemodialysis patient ([place])   . Hepatitis C    Still positive s/p liver biopsy at [facility]  and interferon therapy for 6 months. Most recent lab work was on 10/24/12  . Hepatitis C    "took the tx; gone now" (12/05/2016)  Was treated  . History of blood transfusion ~ 2012/2013   "related to my kidneys; blood was low"  . Hypertension   . Substance abuse ([place])   . Thyroid disease     Medications:  Scheduled:  . aspirin EC  81 mg Oral Daily  . atorvastatin  40 mg Oral Daily  . calcitRIOL  0.75 mcg Oral Q M,W,F-HD  . calcium carbonate  400 mg of elemental calcium Oral BID BM  . Chlorhexidine Gluconate Cloth  6 each Topical Q0600  . Chlorhexidine Gluconate Cloth  6 each Topical Q0600  . docusate sodium  100 mg Oral Daily  . metoprolol succinate  25 mg Oral Daily  . multivitamin  1 tablet Oral QHS  . pantoprazole  40 mg Oral  Daily  . sevelamer carbonate  2,400 mg Oral TID WC    Assessment: 62 yo male with ESRD and new diagnosis of atrial fibrillation.  Patient is now to be started on anticoagulation with heparin and warfarin per pharmacy. Patient currently on an Amiodarone infusion which may affect the INR. No current bleeding noted. Baseline INR 1.16 on 10/3.   Goal of Therapy:  INR 2-3 Monitor platelets by anticoagulation protocol: Yes   Plan:  Heparin 4000 units IV x1 Heparin drip 1050 units/hr HL in 6 hours Daily Heparin level  Warfarin 5mg  PO x 1 Daily INR Monitor for s/sx of bleeding.   Mckyle Solanki A. Levada Dy, PharmD, [place] Pager: [phone] Please utilize Amion for appropriate phone number to reach the unit pharmacist ([place])    04/16/2018,10:46 AM

## 2018-04-16 NOTE — Progress Notes (Signed)
ANTICOAGULATION CONSULT NOTE - Initial Consult  Pharmacy Consult for warfarin and heparin IV Indication: atrial fibrillation  No Known Allergies  Patient Measurements: Height: 5\' 8"  (172.7 cm) Weight: 164 lb 7.4 oz (74.6 kg) IBW/kg (Calculated) : 68.4  Heparin dosing weight 74.5kg    Vital Signs: Temp: 98.7 F (37.1 C) (10/07 1958) Temp Source: Oral (10/07 1958) BP: 93/69 (10/07 1958) Pulse Rate: 79 (10/07 1958)  Labs: Recent Labs    04/15/18 0727 04/16/18 0749 04/16/18 2108  HGB 9.3* 8.6*  --   HCT 30.3* 28.8*  --   PLT 141* 156  --   HEPARINUNFRC  --   --  0.28*  CREATININE 10.41* 12.75*  --     Estimated Creatinine Clearance: 5.7 mL/min (A) (by C-G formula based on SCr of 12.75 mg/dL (H)).   Medical History: Past Medical History:  Diagnosis Date  . Anemia   . CAD (coronary artery disease)    a. 03/2018: cath showing 20 to 30% stenosis along the LAD, luminal irregularities along the RCA, and angiographically normal LCx  . COPD (chronic obstructive pulmonary disease) ([place])   . Dyspnea    "with too much fluid"  . ESRD (end stage renal disease) on dialysis [facility])    "MWF; Jeneen Rinks" (07/22/17)  . Hemodialysis patient ([place])   . Hepatitis C    Still positive s/p liver biopsy at [facility]  and interferon therapy for 6 months. Most recent lab work was on 10/24/12  . Hepatitis C    "took the tx; gone now" (12/05/2016)  Was treated  . History of blood transfusion ~ 2012/2013   "related to my kidneys; blood was low"  . Hypertension   . Substance abuse ([place])   . Thyroid disease     Medications:  Scheduled:  . aspirin EC  81 mg Oral Daily  . atorvastatin  40 mg Oral Daily  . calcitRIOL  0.75 mcg Oral Q M,W,F-HD  . calcium carbonate  400 mg of elemental calcium Oral BID BM  . Chlorhexidine Gluconate Cloth  6 each Topical Q0600  . Chlorhexidine Gluconate Cloth  6 each Topical Q0600  . docusate sodium  100 mg Oral Daily  . metoprolol succinate  25 mg Oral Daily  .  multivitamin  1 tablet Oral QHS  . pantoprazole  40 mg Oral Daily  . sevelamer carbonate  2,400 mg Oral TID WC  . Warfarin - Pharmacist Dosing Inpatient   Does not apply q1800    Assessment: 62 yo male with ESRD and new diagnosis of atrial fibrillation.  Patient is now to be started on anticoagulation with heparin and warfarin per pharmacy. Patient currently on an Amiodarone infusion which may affect the INR. No current bleeding noted. Baseline INR 1.16 on 10/3.   Heparin level came back at 0.28 this PM. We will increase dose and re-check in AM.   Goal of Therapy:  INR 2-3 Monitor platelets by anticoagulation protocol: Yes   Plan:  Increase heparin to 1200 units/hr HL in AM Daily Heparin level  Onnie Boer, PharmD, [place], AAHIVP, CPP Infectious Disease Pharmacist Pager: [phone] 04/16/2018 9:41 PM

## 2018-04-16 NOTE — Progress Notes (Addendum)
2D Echocardiogram has been performed.  04/16/2018, 2:24 PM

## 2018-04-16 NOTE — Progress Notes (Signed)
OT Cancellation    04/16/18 0700  OT Visit Information  Last OT Received On 04/16/18  Reason Eval/Treat Not Completed Patient at procedure or test/ unavailable (HD. Will return as schedule allows. Thank you.)   [place], OTR/L Acute Rehab Pager: [phone] Office: [phone]

## 2018-04-16 NOTE — Progress Notes (Signed)
   VASCULAR SURGERY ASSESSMENT & PLAN:   5 Days Post-Op s/p: Left femoral to below-knee popliteal artery bypass with vein.  He has a palpable dorsalis pedis pulse.  CARDIAC: Appreciate cardiology's help yesterday.  He currently is not having any chest pain.  He appears to be back in sinus rhythm this morning.  He is on amiodarone.  Resume physical therapy when okay with cardiology.  He had a venous duplex scan yesterday because of left leg swelling.  This showed no evidence of DVT.  END-STAGE RENAL DISEASE: For hemodialysis today.  SUBJECTIVE:   No chest pain this morning.  PHYSICAL EXAM:   Vitals:   04/15/18 2000 04/16/18 0002 04/16/18 0400 04/16/18 0446  BP: 100/70 109/72 119/75   Pulse: 95 92 87 (!) 102  Resp: 16 16 12 18   Temp: 99 F (37.2 C) 98.5 F (36.9 C) 98.7 F (37.1 C)   TempSrc: Oral Oral Oral   SpO2: 97% 96% 95% (!) 83%  Weight:    74.9 kg  Height:       Palpable left dorsalis pedis pulse. His incisions look fine.  LABS:   Lab Results  Component Value Date   WBC 5.4 04/15/2018   HGB 9.3 (L) 04/15/2018   HCT 30.3 (L) 04/15/2018   MCV 106.7 (H) 04/15/2018   PLT 141 (L) 04/15/2018   Lab Results  Component Value Date   CREATININE 10.41 (H) 04/15/2018   Lab Results  Component Value Date   INR 1.16 04/11/2018   CBG (last 3)  No results for input(s): GLUCAP in the last 72 hours.  PROBLEM LIST:    Active Problems:   PAD (peripheral artery disease) (HCC)   CURRENT MEDS:   . aspirin EC  81 mg Oral Daily  . atorvastatin  40 mg Oral Daily  . calcitRIOL  0.75 mcg Oral Q M,W,F-HD  . calcium carbonate  400 mg of elemental calcium Oral BID BM  . Chlorhexidine Gluconate Cloth  6 each Topical Q0600  . Chlorhexidine Gluconate Cloth  6 each Topical Q0600  . docusate sodium  100 mg Oral Daily  . heparin      . heparin  5,000 Units Subcutaneous Q8H  . multivitamin  1 tablet Oral QHS  . pantoprazole  40 mg Oral Daily  . sevelamer carbonate  2,400 mg  Oral TID [facility]    Deitra Mayo Beeper: [phone] Office: [phone] 04/16/2018

## 2018-04-16 NOTE — Progress Notes (Signed)
Pt has been very lethargic and drowsy, disoriented to place, but easily reoriented at begining of the shift. Hypotensive 93/69, not eating much and not moving around much. 500 cc fluid bolus administered, see MAR for documentation. BP came up after fluid bolus (105/66), pt stated he feels better. Call light within reach. Will continue to monitor.

## 2018-04-16 NOTE — Progress Notes (Addendum)
Progress Note  Patient Name: Raymond [name] Date of Encounter: 04/16/2018  Primary Cardiologist: Jenkins Rouge, MD   Subjective   Seen in dialysis today. With chills at this time. Noted recurrence of palpitations this morning. Denies chest pain.   Inpatient Medications    Scheduled Meds: . aspirin EC  81 mg Oral Daily  . atorvastatin  40 mg Oral Daily  . calcitRIOL  0.75 mcg Oral Q M,W,F-HD  . calcium carbonate  400 mg of elemental calcium Oral BID BM  . Chlorhexidine Gluconate Cloth  6 each Topical Q0600  . Chlorhexidine Gluconate Cloth  6 each Topical Q0600  . docusate sodium  100 mg Oral Daily  . heparin  5,000 Units Subcutaneous Q8H  . multivitamin  1 tablet Oral QHS  . pantoprazole  40 mg Oral Daily  . sevelamer carbonate  2,400 mg Oral TID WC   Continuous Infusions: . sodium chloride    . amiodarone 30 mg/hr (04/16/18 0952)  . magnesium sulfate 1 - 4 g bolus IVPB     PRN Meds: sodium chloride, acetaminophen **OR** acetaminophen, albuterol, bisacodyl, gabapentin, guaiFENesin-dextromethorphan, labetalol, magnesium sulfate 1 - 4 g bolus IVPB, morphine injection, nitroGLYCERIN, ondansetron, ondansetron, oxyCODONE-acetaminophen, phenol, polyethylene glycol   Vital Signs    Vitals:   04/16/18 0830 04/16/18 0900 04/16/18 0915 04/16/18 0930  BP: (!) 106/56 (!) 122/57 120/72 106/85  Pulse: 100 71 (!) 103 93  Resp:      Temp:      TempSrc:      SpO2:      Weight:      Height:        Intake/Output Summary (Last 24 hours) at 04/16/2018 1004 Last data filed at 04/16/2018 0400 Gross per 24 hour  Intake 1412.56 ml  Output -  Net 1412.56 ml   Filed Weights   04/13/18 1200 04/16/18 0446 04/16/18 0735  Weight: 72.2 kg 74.9 kg 74.5 kg    Telemetry    Back in atrial fibrillation with RVR this morning - rate in the 110s; Had converted to NSR yesterday - Personally Reviewed  Physical Exam   GEN: Laying in bed in no acute distress.   Neck: No JVD, no carotid  bruits Cardiac:  IRIR, no murmurs, rubs, or gallops.  Respiratory: Clear to auscultation bilaterally, no wheezes/ rales/ rhonchi GI: NABS, Soft, nontender, non-distended  MS: No edema; No deformity. Neuro:  Nonfocal, moving all extremities spontaneously Psych: Normal affect   Labs    Chemistry Recent Labs  Lab 04/11/18 0835  04/13/18 0338 04/15/18 0727 04/16/18 0749  NA 139   < > 137 134* 133*  K 5.5*   < > 5.4* 5.1 4.9  CL 94*   < > 96* 94* 92*  CO2 29   < > 30 28 25   GLUCOSE 73   < > 99 103* 103*  BUN 46*   < > 43* 50* 71*  CREATININE 12.09*   < > 9.24* 10.41* 12.75*  CALCIUM 7.7*   < > 7.9* 9.0 9.1  PROT 7.3  --   --   --   --   ALBUMIN 3.2*  --  2.9*  --  2.5*  AST 19  --   --   --   --   ALT 10  --   --   --   --   ALKPHOS 58  --   --   --   --   BILITOT 0.5  --   --   --   --  GFRNONAA 4*   < > 5* 5* 4*  GFRAA 4*   < > 6* 5* 4*  ANIONGAP 16*   < > 11 12 16*   < > = values in this interval not displayed.     Hematology Recent Labs  Lab 04/13/18 0834 04/15/18 0727 04/16/18 0749  WBC 3.5* 5.4 5.4  RBC 2.93* 2.84* 2.70*  HGB 9.6* 9.3* 8.6*  HCT 31.7* 30.3* 28.8*  MCV 108.2* 106.7* 106.7*  MCH 32.8 32.7 31.9  MCHC 30.3 30.7 29.9*  RDW 15.3 15.3 15.5  PLT 141* 141* 156    Cardiac EnzymesNo results for input(s): TROPONINI in the last 168 hours. No results for input(s): TROPIPOC in the last 168 hours.   BNPNo results for input(s): BNP, PROBNP in the last 168 hours.   DDimer No results for input(s): DDIMER in the last 168 hours.   Radiology    Dg Chest Port 1 View  Result Date: 04/15/2018 CLINICAL DATA:  Tachycardia EXAM: PORTABLE CHEST 1 VIEW COMPARISON:  03/31/2018 chest radiograph. FINDINGS: Stable cardiomediastinal silhouette with mild cardiomegaly. No pneumothorax. No pleural effusion. No pulmonary edema. Mild curvilinear opacities at the costophrenic angles. IMPRESSION: 1. Mild cardiomegaly without pulmonary edema. 2. Mild scarring versus  atelectasis at the costophrenic angles. Electronically Signed   By: Ilona Sorrel M.D.   On: 04/15/2018 10:13    Cardiac Studies   Left heart catheterization 03/29/18: Conclusions: 1. Mild, non-obstructive coronary artery disease. 2. Normal left ventricular systolic function with upper normal left ventricular filling pressure.  Recommendations: 1. Medical therapy and risk factor modification to prevent progression of disease. 2. Ok to proceed with vascular surgery from cardiac standpoint. 3. Hemodialysis tomorrow, as scheduled.  Recommend Aspirin 81mg  daily for peripheral vascular disease.  Patient Profile     62 y.o. male with past medical history of PVD, ESRD (HD - MWF), Hepatitis C, HTN, HLD, COPD, chronic anemia and tobacco use who is being followed by cardiology for the evaluation of chest pain, found to have new onset atrial fibrillation.   Assessment & Plan    1. Atrial fibrillation with RVR: patient reported palpitations and chest discomfort yesterday. Found to have new onset atrial fibrillation with RVR on EKG. Felt to be post-op atrial fibrillation. Started on IV amiodarone with conversion to NSR initially, however back in afib RVR at the time of this evaluation.  - Echo pending - Will start coumadin per pharmacy at this time given recurrence of afib for CHA2DS2-VASc Score 2 (HTN, Vascular)  - Continue amiodarone IV for now - Will restart home metoprolol XL 25mg  daily given improvement in blood pressure.    2. Chest pain: likely 2/2 to #1. Recent LHC 03/29/18 reassuring - mild CAD along LAD with luminal irregularities along RCA. No further ischemic evaluation at this time - Continue aspirin and statin  3. HTN: BP soft with SBP in the 80s yesterday. Improved this morning. Home amlodipine and metoprolol held - Would restart home medications as BP tolerates, starting with metoprolol  4. PVD: s/p left femoropopliteal bypass 04/11/18 - Continue management per primary team  5.  ESRD: on HD M/W/F - Continue management per nephrology and primary team     For questions or updates, please contact [place] Please consult [email or web address] for contact info under Cardiology/STEMI.      Signed, Abigail Butts, PA-C  04/16/2018, 10:04 AM   [phone]  I have seen and examined the patient along with Abigail Butts, PA-C .  I have  reviewed the chart, notes and new data.  I agree with PA/NP's note.  Key new complaints: not feeling too well, having chills. No CV complaints Key examination changes: irregular rhythm, atrial fibrillation on monitor since approx 8AM, steadily worsening RVR Key new findings / data: Hgb 8.6 (9.3 yesterday).  PLAN: It seems that atrial fibrillation is not just a postop arrhythmia and anticoagulation is indicated. Suspect we can stop ASA after warfarin therapeutic, but will review w Dr. Ilda Foil, MD, [facility] [phone] 04/16/2018, 11:01 AM

## 2018-04-16 NOTE — Progress Notes (Signed)
Dr. Scot Dock by bedside, notified MD pt is oozing serous fluid from distal end of incision on left leg. Site cleaned with normal saline. Covered with 4x4 gauze and medio tape. Leg elevated. Pt stated his leg feels better, didn't require any prn pain medication. Will continue to monitor.

## 2018-04-16 NOTE — Progress Notes (Signed)
PT Cancellation Note  Patient Details Name: Raymond [name] MRN: [id number] DOB: [birth year]   Cancelled Treatment:    Reason Eval/Treat Not Completed: Medical issues which prohibited therapy. This AM pt in HD. Attempted again this PM and pt with HR of 130-140's resting in bed. Will defer at this time and follow up later.    Shary Decamp [place] 04/16/2018, 3:03 PM [place] Pager [phone] Office [phone]

## 2018-04-16 NOTE — Progress Notes (Signed)
Report given to Dialysis Nurse Cory Munch. Pt stated he will take miralax after coming back from dialysis. Vitals stable. Will continue to monitor.

## 2018-04-17 LAB — HEPARIN LEVEL (UNFRACTIONATED): HEPARIN UNFRACTIONATED: 0.27 [IU]/mL — AB (ref 0.30–0.70)

## 2018-04-17 LAB — PROTIME-INR
INR: 1.27
PROTHROMBIN TIME: 15.8 s — AB (ref 11.4–15.2)

## 2018-04-17 MED ORDER — DARBEPOETIN ALFA 100 MCG/0.5ML IJ SOSY
100.0000 ug | PREFILLED_SYRINGE | INTRAMUSCULAR | Status: DC
  Administered 2018-04-18: 100 ug via INTRAVENOUS
  Filled 2018-04-17: qty 0.5

## 2018-04-17 MED ORDER — WARFARIN VIDEO: Freq: Once | Status: DC

## 2018-04-17 MED ORDER — WARFARIN SODIUM 5 MG PO TABS
5.0000 mg | ORAL_TABLET | Freq: Once | ORAL | Status: AC
  Administered 2018-04-17: 5 mg via ORAL
  Filled 2018-04-17: qty 1

## 2018-04-17 MED ORDER — COUMADIN BOOK
Freq: Once | Status: DC
  Filled 2018-04-17: qty 1

## 2018-04-17 MED ORDER — CHLORHEXIDINE GLUCONATE CLOTH 2 % EX PADS
6.0000 | MEDICATED_PAD | Freq: Every day | CUTANEOUS | Status: DC
  Administered 2018-04-19: 6 via TOPICAL

## 2018-04-17 NOTE — Progress Notes (Signed)
Physical Therapy Treatment Patient Details Name: Raymond [name] MRN: [id number] DOB: 1956-05-22 Today's Date: 04/17/2018    History of Present Illness s/p L fem-pop BPG PMH: ESRD, COPD, HTN, Hep C, h/o substance abuse.    PT Comments    Pt continues to have 10/10 L LE pain with significant edema. Pt did push himself and completed amb of 30' with RW however pt in excruciating pain, L knee buckling on some steps due to pain. Pt typically amb to the bus stop for dialysis however pt is unable to do so at this time. Cont to recommend SNF upon d/c to achieve safe indep function and decreased pain. Acute PT to cont to follow.   Follow Up Recommendations  SNF;Supervision/Assistance - 24 hour     Equipment Recommendations  3in1 (PT);Rolling walker with 5" wheels    Recommendations for Other Services       Precautions / Restrictions Precautions Precautions: Fall Restrictions Weight Bearing Restrictions: No Other Position/Activity Restrictions: pt self L LE WBAT    Mobility  Bed Mobility Overal bed mobility: Needs Assistance Bed Mobility: Supine to Sit     Supine to sit: Min assist     General bed mobility comments: assist for Lt LE , HOB slightly elevated  Transfers Overall transfer level: Needs assistance Equipment used: Rolling walker (2 wheeled) Transfers: Sit to/from Stand Sit to Stand: Min guard         General transfer comment: pt pushed up from bed, limited L LE WBing tolerance, labored effort due to pain, min guard for safety  Ambulation/Gait Ambulation/Gait assistance: Min assist Gait Distance (Feet): 30 Feet Assistive device: Rolling walker (2 wheeled) Gait Pattern/deviations: Step-to pattern;Decreased dorsiflexion - left;Decreased stride length;Decreased weight shift to left;Trunk flexed Gait velocity: slow Gait velocity interpretation: <1.8 ft/sec, indicate of risk for recurrent falls General Gait Details: pt with antalgic L LE limp occasional buckling from  pain, pt reports 10/10 pain during ambulation and is very dependent on RW, minA for walker management around obstacles, verbal cues to try to maintain upright position, pt with very minimal L LE WBing tolerance   Stairs             Wheelchair Mobility    Modified Rankin (Stroke Patients Only)       Balance Overall balance assessment: Needs assistance Sitting-balance support: Single extremity supported Sitting balance-Leahy Scale: Good     Standing balance support: Bilateral upper extremity supported Standing balance-Leahy Scale: Poor Standing balance comment: heavy reliance on UEs, no weight on L LE due to pain                            Cognition Arousal/Alertness: Awake/alert Behavior During Therapy: WFL for tasks assessed/performed Overall Cognitive Status: Within Functional Limits for tasks assessed                                        Exercises General Exercises - Lower Extremity Ankle Circles/Pumps: AROM;Both;10 reps;Seated Long Arc Quad: AROM;Both;10 reps;Seated(limited extension due to pain and sweeling)    General Comments General comments (skin integrity, edema, etc.): pt with dressing on L LE incision, pt with noted swelling in L LE      Pertinent Vitals/Pain Pain Assessment: 0-10 Pain Score: 9  Pain Location: L LE Pain Descriptors / Indicators: Burning;Grimacing;Guarding Pain Intervention(s): Limited activity within patient's tolerance  Home Living                      Prior Function            PT Goals (current goals can now be found in the care plan section) Acute Rehab PT Goals Patient Stated Goal: to go home Progress towards PT goals: Progressing toward goals    Frequency    Min 3X/week      PT Plan Current plan remains appropriate    Co-evaluation              AM-PAC PT "6 Clicks" Daily Activity  Outcome Measure  Difficulty turning over in bed (including adjusting bedclothes,  sheets and blankets)?: Unable Difficulty moving from lying on back to sitting on the side of the bed? : Unable Difficulty sitting down on and standing up from a chair with arms (e.g., wheelchair, bedside commode, etc,.)?: Unable Help needed moving to and from a bed to chair (including a wheelchair)?: A Little Help needed walking in hospital room?: A Little Help needed climbing 3-5 steps with a railing? : A Lot 6 Click Score: 11    End of Session Equipment Utilized During Treatment: Gait belt Activity Tolerance: Patient limited by pain Patient left: with call bell/phone within reach;in chair Nurse Communication: Mobility status;Patient requests pain meds PT Visit Diagnosis: Unsteadiness on feet (R26.81);Other abnormalities of gait and mobility (R26.89);Difficulty in walking, not elsewhere classified (R26.2);Pain Pain - Right/Left: Left Pain - part of body: Leg     Time: 1203-1232 PT Time Calculation (min) (ACUTE ONLY): 29 min  Charges:  $Gait Training: 8-22 mins $Therapeutic Exercise: 8-22 mins                     Kittie Plater, PT, DPT Acute Rehabilitation Services Pager #: [phone] Office #: [phone]    Berline Lopes 04/17/2018, 1:17 PM

## 2018-04-17 NOTE — Progress Notes (Signed)
Subjective: Interval History: none.. Easily palpable dorsalis pedis pulse.  Moderate swelling.  Objective: Vital signs in last 24 hours: Temp:  [97.9 F (36.6 C)-98.7 F (37.1 C)] 97.9 F (36.6 C) (10/08 0402) Pulse Rate:  [71-139] 76 (10/08 0402) Resp:  [12-20] 20 (10/08 0402) BP: (93-127)/(52-88) 117/88 (10/08 0402) SpO2:  [94 %-100 %] 99 % (10/08 0402) Weight:  [74.6 kg-75 kg] 75 kg (10/08 0402)  Intake/Output from previous day: 10/07 0701 - 10/08 0700 In: 1212.6 [P.O.:170; I.V.:1042.6] Out: 0  Intake/Output this shift: No intake/output data recorded.  Palpable dorsalis pedis pulse.  Moderate swelling.  Incisions all healing  Lab Results: Recent Labs    04/15/18 0727 04/16/18 0749  WBC 5.4 5.4  HGB 9.3* 8.6*  HCT 30.3* 28.8*  PLT 141* 156   BMET Recent Labs    04/15/18 0727 04/16/18 0749  NA 134* 133*  K 5.1 4.9  CL 94* 92*  CO2 28 25  GLUCOSE 103* 103*  BUN 50* 71*  CREATININE 10.41* 12.75*  CALCIUM 9.0 9.1    Studies/Results: Ct Abdomen Pelvis Wo Contrast  Result Date: 03/31/2018 CLINICAL DATA:  Generalized weakness, nausea, vomiting, hematuria EXAM: CT ABDOMEN AND PELVIS WITHOUT CONTRAST TECHNIQUE: Multidetector CT imaging of the abdomen and pelvis was performed following the standard protocol without IV contrast. COMPARISON:  None. FINDINGS: Lower chest: Minor basilar atelectasis. Cardiomegaly without pericardial or pleural effusion. Descending thoracic aorta is atherosclerotic and mildly ectatic. Degenerative changes of the spine with osteophytes. Hepatobiliary: No focal hepatic abnormality or ductal dilatation within the limits of noncontrast imaging. Hyper attenuation in the gallbladder compatible with vicarious contrast excretion. Biliary system is nondilated. Pancreas: No significant focal abnormality by noncontrast imaging. No surrounding inflammatory process. Spleen: Normal in size without focal abnormality. Adrenals/Urinary Tract: Normal adrenal  glands. Atrophic kidneys with numerous hypodense renal cysts compatible with end-stage renal disease. No renal obstruction or hydronephrosis. No hydroureter. Bladder is collapsed with mild wall thickening. Stomach/Bowel: Negative for bowel obstruction, significant dilatation, ileus, or free air. Scattered colonic diverticulosis. Normal appearing appendix containing air. No acute inflammatory process, fluid collection, or abscess. No ascites. Vascular/Lymphatic: Atherosclerosis of aorta. Minor ectasia. No significant aneurysm. No retroperitoneal hemorrhage or hematoma. No adenopathy. Reproductive: Prostate gland unremarkable. Seminal vesicles are symmetric. High positioned right testicle as before. Other: No abdominal wall hernia or abnormality. No abdominopelvic ascites. Musculoskeletal: Degenerative changes noted. No acute osseous finding. IMPRESSION: No acute intra-abdominal or pelvic finding by noncontrast CT. Chronic renal atrophy and renal cysts related to end-stage renal disease. Vicarious contrast excretion in the gallbladder noted from recent contrast for coronary catheterization. Atherosclerosis as above Electronically Signed   By: Jerilynn Mages.  Shick M.D.   On: 03/31/2018 14:30   Ct Head Wo Contrast  Result Date: 03/31/2018 CLINICAL DATA:  Generalized weakness for a couple days. Hematuria. Unequal pupils. EXAM: CT HEAD WITHOUT CONTRAST TECHNIQUE: Contiguous axial images were obtained from the base of the skull through the vertex without intravenous contrast. COMPARISON:  Maxillofacial CT 07/22/2017.  Head CT 01/30/2008. FINDINGS: Brain: There is no evidence of acute intracranial hemorrhage, mass lesion, brain edema or extra-axial fluid collection. The ventricles and subarachnoid spaces are appropriately sized for age. There is no CT evidence of acute cortical infarction. There is patchy low-density in the periventricular white matter, likely due to chronic small vessel ischemic changes. Vascular: Intracranial  vascular calcifications. No hyperdense vessel identified. Skull: Negative for fracture or focal lesion. Sinuses/Orbits: There is chronic deformity of the lamina papyracea on the left. The visualized  paranasal sinuses, mastoid air cells and middle ears are clear. Interval lens surgery in the right orbit. Other: None. IMPRESSION: 1. No acute intracranial findings. 2. Old facial fractures and previous right lens dislocation. 3. Mild chronic small vessel ischemic changes in the periventricular white matter. Electronically Signed   By: Richardean Sale M.D.   On: 03/31/2018 14:26   Dg Chest Port 1 View  Result Date: 04/15/2018 CLINICAL DATA:  Tachycardia EXAM: PORTABLE CHEST 1 VIEW COMPARISON:  03/31/2018 chest radiograph. FINDINGS: Stable cardiomediastinal silhouette with mild cardiomegaly. No pneumothorax. No pleural effusion. No pulmonary edema. Mild curvilinear opacities at the costophrenic angles. IMPRESSION: 1. Mild cardiomegaly without pulmonary edema. 2. Mild scarring versus atelectasis at the costophrenic angles. Electronically Signed   By: Ilona Sorrel M.D.   On: 04/15/2018 10:13   Dg Chest Portable 1 View  Result Date: 03/31/2018 CLINICAL DATA:  Shortness of breath. EXAM: PORTABLE CHEST 1 VIEW COMPARISON:  08/07/2017 and 06/20/2017 radiographs.  CT 06/12/2017. FINDINGS: 0902 hours. There is stable cardiomegaly. The lungs are clear. There is no pleural effusion or pneumothorax. There are stable prominent nipple shadows bilaterally. No acute osseous findings are seen. Telemetry leads overlie the chest. IMPRESSION: Interval resolution of left lung opacities. Stable cardiomegaly. No acute cardiopulmonary process. Electronically Signed   By: Richardean Sale M.D.   On: 03/31/2018 09:22   Dg Abd Portable 1v  Result Date: 04/03/2018 CLINICAL DATA:  Bloody diarrhea and nausea 2 days ago. The patient has dialysis dependent renal failure. COPD. EXAM: PORTABLE ABDOMEN - 1 VIEW COMPARISON:  Abdominal and pelvic  CT scan of March 31, 2018 FINDINGS: The bowel gas pattern is within the limits of normal. The colonic stool burden is not excessive. No abnormal soft tissue calcifications are observed. Mild lumbar spine endplate spurring is observed. IMPRESSION: No acute intra-abdominal abnormality is observed. Electronically Signed   By: David  Martinique M.D.   On: 04/03/2018 08:36   Anti-infectives: Anti-infectives (From admission, onward)   Start     Dose/Rate Route Frequency Ordered Stop   04/12/18 0600  ceFAZolin (ANCEF) IVPB 2g/100 mL premix     2 g 200 mL/hr over 30 Minutes Intravenous  Once 04/11/18 1934 04/12/18 1439   04/11/18 1930  ceFAZolin (ANCEF) IVPB 2g/100 mL premix  Status:  Discontinued     2 g 200 mL/hr over 30 Minutes Intravenous Every 8 hours 04/11/18 1926 04/11/18 1934   04/11/18 0802  ceFAZolin (ANCEF) IVPB 2g/100 mL premix     2 g 200 mL/hr over 30 Minutes Intravenous 30 min pre-op 04/11/18 0802 04/11/18 0949      Assessment/Plan: s/p Procedure(s): BYPASS GRAFT FEMORAL-POPLITEAL ARTERY LEFT LEG (Left) On his rhythm.  Cardiology following.  Continue to mobilize   LOS: 6 days   Raymond [name] 04/17/2018, 7:49 AM

## 2018-04-17 NOTE — Progress Notes (Signed)
ANTICOAGULATION CONSULT NOTE - Follow Up Consult  Pharmacy Consult for heparin Indication: atrial fibrillation  Labs: Recent Labs    04/15/18 0727 04/16/18 0749 04/16/18 2108 04/17/18 0451  HGB 9.3* 8.6*  --   --   HCT 30.3* 28.8*  --   --   PLT 141* 156  --   --   LABPROT  --   --   --  15.8*  INR  --   --   --  1.27  HEPARINUNFRC  --   --  0.28* 0.27*  CREATININE 10.41* 12.75*  --   --     Assessment: 62yo male subtherapeutic on heparin after rate change.  Goal of Therapy:  Heparin level 0.3-0.7 units/ml   Plan:  Will increase heparin gtt by 1-2 units/kg/hr to 1300 units/hr and check level in 8 hours.    Wynona Neat, PharmD, BCPS  04/17/2018,7:44 AM

## 2018-04-17 NOTE — Progress Notes (Signed)
Dr Justin Mend called due to lab not being able to obtain blood. No call back at this time. Jerald Kief

## 2018-04-17 NOTE — NC FL2 (Signed)
[place] MEDICAID FL2 LEVEL OF CARE SCREENING TOOL     IDENTIFICATION  Patient Name: Raymond [name] Birthdate: [birth year] Sex: male Admission Date (Current Location): 04/11/2018  [facility] and [place] Number:  Herbalist and Address:  [facility], [place] [address] [place], [place] [address]      Provider Number: [id number]  Attending Physician Name and Address:  Rosetta Posner, MD  Relative Name and Phone Number:  Zahki Hoogendoorn, sister, [phone]    Current Level of Care: Hospital Recommended Level of Care: [place] Prior Approval Number:    Date Approved/Denied:   PASRR Number: [id number]  Discharge Plan: SNF    Current Diagnoses: Patient Active Problem List   Diagnosis Date Noted  . PAD (peripheral artery disease) ([place]) 04/11/2018  . Sepsis ([place]) 03/31/2018  . Preop cardiovascular exam 03/29/2018  . Volume overload 08/07/2017  . Acute pulmonary edema (H[facility])   . Dyspnea 06/20/2017  . Acute bronchitis   . COPD exacerbation ([place])   . End-stage renal disease on hemodialysis ([place])   . Hypoxia 12/05/2016  . Hyperkalemia 12/19/2015  . Hypoglycemia 12/19/2015  . Polysubstance abuse ([place]) 12/19/2015  . Homelessness 12/19/2015  . Anemia associated with stage 5 chronic renal failure ([place]) 12/19/2015  . Atypical chest pain 12/19/2013  . Atherosclerosis of native arteries of extremity with intermittent claudication ([place]) 12/04/2012  . ESRD (end stage renal disease) ([place]) 12/04/2012  . HEPATITIS C 09/07/2006  . HYPERCHOLESTEROLEMIA 09/07/2006  . HYPERTENSION, BENIGN SYSTEMIC 09/07/2006    Orientation RESPIRATION BLADDER Height & Weight     Self, Time, Situation, Place  Normal Continent Weight: 165 lb 5.5 oz (75 kg) Height:  5\' 8"  (172.7 cm)  BEHAVIORAL SYMPTOMS/MOOD NEUROLOGICAL BOWEL NUTRITION STATUS      Continent Diet(see discharge summary)  AMBULATORY STATUS COMMUNICATION OF NEEDS Skin   Limited Assist Verbally  Surgical wounds, Other (Comment)(right forearm AV fistula; closed incision on proximal left leg with skin glue; closed incision on left leg distal with gauze; closed incision on left groin)                       Personal Care Assistance Level of Assistance  Bathing, Feeding, Dressing Bathing Assistance: Limited assistance Feeding assistance: Independent Dressing Assistance: Limited assistance     Functional Limitations Info  Sight, Hearing, Speech Sight Info: Impaired Hearing Info: Adequate Speech Info: Adequate    SPECIAL CARE FACTORS FREQUENCY  OT (By licensed OT), PT (By licensed PT)     PT Frequency: 5x week OT Frequency: 5x week            Contractures Contractures Info: Not present    Additional Factors Info  Code Status, Allergies Code Status Info: Full Code Allergies Info: No Known Allergies           Current Medications (04/17/2018):  This is the current hospital active medication list Current Facility-Administered Medications  Medication Dose Route Frequency Provider Last Rate Last Dose  . acetaminophen (TYLENOL) tablet 325-650 mg  325-650 mg Oral Q4H PRN Rhyne, Samantha J, PA-C       Or  . acetaminophen (TYLENOL) suppository 325-650 mg  325-650 mg Rectal Q4H PRN Rhyne, Samantha J, PA-C      . albuterol (PROVENTIL) (2.5 MG/3ML) 0.083% nebulizer solution 3 mL  3 mL Nebulization Q6H PRN Rhyne, Samantha J, PA-C      . amiodarone (NEXTERONE PREMIX) 360-4.14 MG/200ML-% (1.8 mg/mL) IV infusion  30 mg/hr  Intravenous Continuous Erma Heritage, PA-C 16.67 mL/hr at 04/17/18 0924 30 mg/hr at 04/17/18 0924  . aspirin EC tablet 81 mg  81 mg Oral Daily Gabriel Earing, PA-C   81 mg at 04/17/18 9449  . atorvastatin (LIPITOR) tablet 40 mg  40 mg Oral Daily Rhyne, Hulen Shouts, PA-C   40 mg at 04/17/18 6759  . bisacodyl (DULCOLAX) suppository 10 mg  10 mg Rectal Daily PRN Rhyne, Samantha J, PA-C      . calcitRIOL (ROCALTROL) capsule 0.75 mcg  0.75 mcg Oral Q  M,W,F-HD Lynnda Child, PA-C   0.75 mcg at 04/16/18 0852  . calcium carbonate (TUMS - dosed in mg elemental calcium) chewable tablet 400 mg of elemental calcium  400 mg of elemental calcium Oral BID BM Lynnda Child, PA-C   400 mg of elemental calcium at 04/17/18 1417  . Chlorhexidine Gluconate Cloth 2 % PADS 6 each  6 each Topical Q0600 Roney Jaffe, MD   6 each at 04/15/18 1100  . Chlorhexidine Gluconate Cloth 2 % PADS 6 each  6 each Topical Q0600 Roney Jaffe, MD   6 each at 04/17/18 0645  . [START ON 04/18/2018] Chlorhexidine Gluconate Cloth 2 % PADS 6 each  6 each Topical Q0600 Penninger, [place], [place]      . coumadin book   Does not apply Once Romona Curls, [facility]      . [START ON 04/18/2018] Darbepoetin Alfa (ARANESP) injection 100 mcg  100 mcg Intravenous Q Wed-HD Penninger, Ria Comment, PA      . docusate sodium (COLACE) capsule 100 mg  100 mg Oral Daily Rhyne, Samantha J, PA-C   100 mg at 04/17/18 0925  . gabapentin (NEURONTIN) capsule 300 mg  300 mg Oral QHS PRN Gabriel Earing, PA-C   300 mg at 04/13/18 2050  . guaiFENesin-dextromethorphan (ROBITUSSIN DM) 100-10 MG/5ML syrup 15 mL  15 mL Oral Q4H PRN Rhyne, Samantha J, PA-C      . heparin ADULT infusion 100 units/mL (25000 units/250mL sodium chloride 0.45%)  1,300 Units/hr Intravenous Continuous Rosetta Posner, MD 13 mL/hr at 04/17/18 0807 1,300 Units/hr at 04/17/18 0807  . labetalol (NORMODYNE,TRANDATE) injection 10 mg  10 mg Intravenous Q10 min PRN Rhyne, Samantha J, PA-C      . magnesium sulfate IVPB 2 g 50 mL  2 g Intravenous Daily PRN Rhyne, Samantha J, PA-C      . metoprolol succinate (TOPROL-XL) 24 hr tablet 25 mg  25 mg Oral Daily Kroeger, Krista M., PA-C   25 mg at 04/17/18 0926  . morphine 2 MG/ML injection 2 mg  2 mg Intravenous Q2H PRN Rhyne, Hulen Shouts, PA-C   1 mg at 04/15/18 0851  . multivitamin (RENA-VIT) tablet 1 tablet  1 tablet Oral QHS Lynnda Child, PA-C   1 tablet at 04/16/18 2147  .  nitroGLYCERIN (NITROSTAT) SL tablet 0.4 mg  0.4 mg Sublingual Q5 min PRN Dagoberto Ligas, PA-C      . ondansetron [facility]) injection 4 mg  4 mg Intravenous Q6H PRN Rhyne, Samantha J, PA-C      . ondansetron (ZOFRAN) tablet 4 mg  4 mg Oral Q8H PRN Rhyne, Samantha J, PA-C      . oxyCODONE-acetaminophen (PERCOCET/ROXICET) 5-325 MG per tablet 1-2 tablet  1-2 tablet Oral Q4H PRN Rhyne, Hulen Shouts, PA-C   2 tablet at 04/16/18 1327  . pantoprazole (PROTONIX) EC tablet 40 mg  40 mg Oral Daily Rhyne, Hulen Shouts, PA-C   40  mg at 04/17/18 0928  . phenol (CHLORASEPTIC) mouth spray 1 spray  1 spray Mouth/Throat PRN Rhyne, Samantha J, PA-C      . polyethylene glycol (MIRALAX / GLYCOLAX) packet 17 g  17 g Oral Daily PRN Rhyne, Samantha J, PA-C      . sevelamer carbonate (RENVELA) tablet 2,400 mg  2,400 mg Oral TID WC Lynnda Child, PA-C   2,400 mg at 04/17/18 1210  . warfarin (COUMADIN) tablet 5 mg  5 mg Oral ONCE-1800 Romona Curls, RPH      . warfarin (COUMADIN) video   Does not apply Once Romona Curls, [facility]      . Warfarin - Pharmacist Dosing Inpatient   Does not apply q1800 Theotis Burrow, RPH       Facility-Administered Medications Ordered in Other Encounters  Medication Dose Route Frequency Provider Last Rate Last Dose  . midazolam (VERSED) 5 MG/5ML injection    Anesthesia Intra-op Sammie Bench, CRNA   2 mg at 04/11/18 1042     Discharge Medications: Please see discharge summary for a list of discharge medications.  Relevant Imaging Results:  Relevant Lab Results:   Additional Information [phone]; dialysis MWF at [place]

## 2018-04-17 NOTE — Progress Notes (Addendum)
Progress Note  Patient Name: Raymond [name] Date of Encounter: 04/17/2018  Primary Cardiologist: Jenkins Rouge, MD   Subjective   Denies any CP or SOB, but has soreness all over. Says he has not been able to walk due to the pain.   Inpatient Medications    Scheduled Meds: . aspirin EC  81 mg Oral Daily  . atorvastatin  40 mg Oral Daily  . calcitRIOL  0.75 mcg Oral Q M,W,F-HD  . calcium carbonate  400 mg of elemental calcium Oral BID BM  . Chlorhexidine Gluconate Cloth  6 each Topical Q0600  . Chlorhexidine Gluconate Cloth  6 each Topical Q0600  . coumadin book   Does not apply Once  . docusate sodium  100 mg Oral Daily  . metoprolol succinate  25 mg Oral Daily  . multivitamin  1 tablet Oral QHS  . pantoprazole  40 mg Oral Daily  . sevelamer carbonate  2,400 mg Oral TID WC  . warfarin  5 mg Oral ONCE-1800  . warfarin   Does not apply Once  . Warfarin - Pharmacist Dosing Inpatient   Does not apply q1800   Continuous Infusions: . amiodarone 30 mg/hr (04/16/18 2155)  . heparin 1,300 Units/hr (04/17/18 0807)  . magnesium sulfate 1 - 4 g bolus IVPB     PRN Meds: acetaminophen **OR** acetaminophen, albuterol, bisacodyl, gabapentin, guaiFENesin-dextromethorphan, labetalol, magnesium sulfate 1 - 4 g bolus IVPB, morphine injection, nitroGLYCERIN, ondansetron, ondansetron, oxyCODONE-acetaminophen, phenol, polyethylene glycol   Vital Signs    Vitals:   04/16/18 2223 04/16/18 2300 04/17/18 0402 04/17/18 0817  BP: 105/66 111/77 117/88 109/74  Pulse: 76 71 76 72  Resp: 12 15 20 20   Temp:  98.5 F (36.9 C) 97.9 F (36.6 C) 98 F (36.7 C)  TempSrc:  Oral Oral Oral  SpO2: 99% 100% 99% 100%  Weight:   75 kg   Height:        Intake/Output Summary (Last 24 hours) at 04/17/2018 0852 Last data filed at 04/17/2018 0400 Gross per 24 hour  Intake 1212.63 ml  Output 0 ml  Net 1212.63 ml   Filed Weights   04/16/18 0735 04/16/18 1142 04/17/18 0402  Weight: 74.5 kg 74.6 kg 75 kg     Telemetry    NSR, no afib - Personally Reviewed  ECG    afib with RVR 04/15/2018 - Personally Reviewed  Physical Exam   GEN: No acute distress.   Neck: No JVD Cardiac: RRR, no murmurs, rubs, or gallops.  Respiratory: Clear to auscultation bilaterally. GI: Soft, nontender, non-distended  MS: No edema; No deformity. L groin surgical scar healed.  Neuro:  Nonfocal  Psych: Normal affect   Labs    Chemistry Recent Labs  Lab 04/11/18 0835  04/13/18 0338 04/15/18 0727 04/16/18 0749  NA 139   < > 137 134* 133*  K 5.5*   < > 5.4* 5.1 4.9  CL 94*   < > 96* 94* 92*  CO2 29   < > 30 28 25   GLUCOSE 73   < > 99 103* 103*  BUN 46*   < > 43* 50* 71*  CREATININE 12.09*   < > 9.24* 10.41* 12.75*  CALCIUM 7.7*   < > 7.9* 9.0 9.1  PROT 7.3  --   --   --   --   ALBUMIN 3.2*  --  2.9*  --  2.5*  AST 19  --   --   --   --  ALT 10  --   --   --   --   ALKPHOS 58  --   --   --   --   BILITOT 0.5  --   --   --   --   GFRNONAA 4*   < > 5* 5* 4*  GFRAA 4*   < > 6* 5* 4*  ANIONGAP 16*   < > 11 12 16*   < > = values in this interval not displayed.     Hematology Recent Labs  Lab 04/13/18 0834 04/15/18 0727 04/16/18 0749  WBC 3.5* 5.4 5.4  RBC 2.93* 2.84* 2.70*  HGB 9.6* 9.3* 8.6*  HCT 31.7* 30.3* 28.8*  MCV 108.2* 106.7* 106.7*  MCH 32.8 32.7 31.9  MCHC 30.3 30.7 29.9*  RDW 15.3 15.3 15.5  PLT 141* 141* 156    Cardiac EnzymesNo results for input(s): TROPONINI in the last 168 hours. No results for input(s): TROPIPOC in the last 168 hours.   BNPNo results for input(s): BNP, PROBNP in the last 168 hours.   DDimer No results for input(s): DDIMER in the last 168 hours.   Radiology    Dg Chest Port 1 View  Result Date: 04/15/2018 CLINICAL DATA:  Tachycardia EXAM: PORTABLE CHEST 1 VIEW COMPARISON:  03/31/2018 chest radiograph. FINDINGS: Stable cardiomediastinal silhouette with mild cardiomegaly. No pneumothorax. No pleural effusion. No pulmonary edema. Mild curvilinear  opacities at the costophrenic angles. IMPRESSION: 1. Mild cardiomegaly without pulmonary edema. 2. Mild scarring versus atelectasis at the costophrenic angles. Electronically Signed   By: Ilona Sorrel M.D.   On: 04/15/2018 10:13    Cardiac Studies   Echo 04/16/2018 LV EF: 60% -   65% Study Conclusions  - Left ventricle: Wall thickness was increased in a pattern of   severe LVH. Systolic function was normal. The estimated ejection   fraction was in the range of 60% to 65%. Although no diagnostic   regional wall motion abnormality was identified, this possibility   cannot be completely excluded on the basis of this study. The   study was not technically sufficient to allow evaluation of LV   diastolic dysfunction due to atrial fibrillation. - Aortic valve: There was no stenosis. - Mitral valve: Mildly calcified annulus. There was no significant   regurgitation. - Left atrium: The atrium was moderately dilated. - Right ventricle: The cavity size was normal. Systolic function   was normal. - Pulmonary arteries: No complete TR doppler jet so unable to   estimate PA systolic pressure. - Inferior vena cava: The vessel was normal in size. The   respirophasic diameter changes were in the normal range (>= 50%),   consistent with normal central venous pressure.  Impressions:  - The patient was in rapid atrial fibrillation. Normal LV size with   severe LV hypertrophy. EF 60-65%. Normal RV size and systolic   function. No significant valvular abnormalities.   Patient Profile     62 y.o. male with past medical history of PVD, ESRD (HD - MWF), Hepatitis C, HTN, HLD, COPD, chronic anemia and tobacco usewho presented for L fempop bypass. Post course complicated by occurrence ofchest pain, found to have new onset atrial fibrillation.  Assessment & Plan    1. Atrial fibrillation with RVR  - initially felt to be postop afib, converted to NSR after placed on amiodarone, however afib  recurred. Given recurrence, patient was started on coumadin for anticoagulation.   - This patients CHA2DS2-VASc Score and unadjusted Ischemic Stroke  Rate (% per year) is equal to 2.2 % stroke rate/year from a score of 2  Above score calculated as 1 point each if present [CHF, HTN, DM, Vascular=MI/PAD/Aortic Plaque, Age if 65-74, or Male] Above score calculated as 2 points each if present [Age > 75, or Stroke/TIA/TE]  - Echo 04/16/2018 showed EF 60-65%, no significant valvular issue  - INR 1.27. Vascular surgery to comment on if it is ok from vascular perspective to stop aspirin given the need for coumadin.  - continue switch IV amiodarone to PO amiodarone 200mg  BID. Uptitrate BB as outpatient and potentially stop amiodarone after a few weeks to see if afib recurs. Note, patient used be on 50mg  daily of toprol at home, now on 25mg  due to low BP  2. Chest pain: likely due to #1, patient had cath by Dr. Saunders Revel on 03/29/2018 which showed 20-30% LAD stenosis, luminal irregularities along RCA and normal LCx  3. HTN: home Toprol dose reduced to 25mg  daily.   4. PVD s/p L fempop bypass 04/11/2018  5. ESRD on HD MWF       For questions or updates, please contact [place] Please consult [email or web address] for contact info under        Signed, Almyra Deforest, [place]  04/17/2018, 8:52 AM    I have seen and examined the patient along with Almyra Deforest, PA .  I have reviewed the chart, notes and new data.  I agree with PA/NP's note.  Key new complaints: a little confused and sleepy, easy to re-orient, no dyspnea. "I feel a whole lot better than yesterday" Key examination changes: RRR w occ ectopy Key new findings / data: SR w PACs on monitor; echo reviewed  PLAN: Change to PO amiodarone. On heparin to warfarin transition. Target INRn 2.0-3.0. Agree w plan to gradually wean amio if no recurrence in next month.  Sanda Klein, MD, [place] [phone] 04/17/2018, 10:48 AM

## 2018-04-17 NOTE — Progress Notes (Signed)
[name] for warfarin and heparin IV Indication: atrial fibrillation  No Known Allergies  Patient Measurements: Height: 5\' 8"  (172.7 cm) Weight: 165 lb 5.5 oz (75 kg) IBW/kg (Calculated) : 68.4  Heparin dosing weight 74.5kg    Vital Signs: Temp: 98 F (36.7 C) (10/08 0817) Temp Source: Oral (10/08 0817) BP: 109/74 (10/08 0817) Pulse Rate: 72 (10/08 0817)  Labs: Recent Labs    04/15/18 0727 04/16/18 0749 04/16/18 2108 04/17/18 0451  HGB 9.3* 8.6*  --   --   HCT 30.3* 28.8*  --   --   PLT 141* 156  --   --   LABPROT  --   --   --  15.8*  INR  --   --   --  1.27  HEPARINUNFRC  --   --  0.28* 0.27*  CREATININE 10.41* 12.75*  --   --     Estimated Creatinine Clearance: 5.7 mL/min (A) (by C-G formula based on SCr of 12.75 mg/dL (H)).   Medical History: Past Medical History:  Diagnosis Date  . Anemia   . CAD (coronary artery disease)    a. 03/2018: cath showing 20 to 30% stenosis along the LAD, luminal irregularities along the RCA, and angiographically normal LCx  . COPD (chronic obstructive pulmonary disease) ([place])   . Dyspnea    "with too much fluid"  . ESRD (end stage renal disease) on dialysis [facility])    "MWF; [facility]" (07/22/17)  . Hemodialysis patient ([place])   . Hepatitis C    Still positive s/p liver biopsy at [facility]  and interferon therapy for 6 months. Most recent lab work was on 10/24/12  . Hepatitis C    "took the tx; gone now" (12/05/2016)  Was treated  . History of blood transfusion ~ 2012/2013   "related to my kidneys; blood was low"  . Hypertension   . Substance abuse ([place])   . Thyroid disease     Medications:  Scheduled:  . aspirin EC  81 mg Oral Daily  . atorvastatin  40 mg Oral Daily  . calcitRIOL  0.75 mcg Oral Q M,W,F-HD  . calcium carbonate  400 mg of elemental calcium Oral BID BM  . Chlorhexidine Gluconate Cloth  6 each Topical Q0600  . Chlorhexidine Gluconate Cloth  6 each Topical Q0600  .  docusate sodium  100 mg Oral Daily  . metoprolol succinate  25 mg Oral Daily  . multivitamin  1 tablet Oral QHS  . pantoprazole  40 mg Oral Daily  . sevelamer carbonate  2,400 mg Oral TID WC  . Warfarin - Pharmacist Dosing Inpatient   Does not apply q1800    Assessment: 62 yo male with ESRD and new diagnosis of atrial fibrillation.  Patient is now to be started on anticoagulation with heparin and warfarin per pharmacy. Patient currently on an Amiodarone infusion which may affect the INR. No current bleeding noted. Baseline INR 1.16 on 10/3.   Heparin level pending for this afternoon after rate increase this AM. INR up to 1.27. Hg low stable 8.6, plt improved. No bleed documented.  Goal of Therapy:  INR 2-3 Monitor platelets by anticoagulation protocol: Yes   Plan:  Continue heparin at 1300 units/hr Warfarin 5mg  PO x 1 1600 heparin level Monitor daily heparin level/INR/CBC, s/sx bleeding  Elicia Lamp, PharmD, BCPS Clinical Pharmacist Clinical phone [phone] Please check AMION for all [place] contact numbers 04/17/2018 8:29 AM

## 2018-04-17 NOTE — Progress Notes (Signed)
Occupational Therapy Treatment Patient Details Name: Raymond [name] MRN: [id number] DOB: May 13, 1956 Today's Date: 04/17/2018    History of present illness s/p L fem-pop BPG PMH: ESRD, COPD, HTN, Hep C, h/o substance abuse.   OT comments  Pt continues to be significantly limited by pain.  He continues to require mod - max A for LB ADLs and limited to stand pivot transfers.   Do not feel he would be able to tolerate the intensity of CIR, and therefore, discharge recommendation changed to SNF.   Follow Up Recommendations  Supervision/Assistance - 24 hour;SNF    Equipment Recommendations  3 in 1 bedside commode    Recommendations for Other Services      Precautions / Restrictions Precautions Precautions: Fall       Mobility Bed Mobility Overal bed mobility: Needs Assistance         Sit to supine: Min guard   General bed mobility comments: increased effor to lift Lt LE onto bed   Transfers Overall transfer level: Needs assistance Equipment used: Rolling walker (2 wheeled) Transfers: Sit to/from [facility] Sit to Stand: Min guard Stand pivot transfers: Min assist       General transfer comment: cues for safety min A for walker use     Balance Overall balance assessment: Needs assistance Sitting-balance support: Single extremity supported Sitting balance-Leahy Scale: Good     Standing balance support: Bilateral upper extremity supported Standing balance-Leahy Scale: Poor                             ADL either performed or assessed with clinical judgement   ADL                           Toilet Transfer: Minimal assistance;Stand-pivot;BSC;RW Toilet Transfer Details (indicate cue type and reason): Pt requires assist for walker management, safety and balance.  he is can bend forward only minimally to reach just below his knees, and is unable to access feet          Functional mobility during ADLs: Minimal assistance;Rolling  walker General ADL Comments: Pt limited by pain      Vision       Perception     Praxis      Cognition Arousal/Alertness: Awake/alert Behavior During Therapy: WFL for tasks assessed/performed Overall Cognitive Status: Within Functional Limits for tasks assessed                                          Exercises     Shoulder Instructions       General Comments      Pertinent Vitals/ Pain       Pain Assessment: Faces Faces Pain Scale: Hurts whole lot Pain Location: L LE Pain Descriptors / Indicators: Burning;Grimacing;Guarding Pain Intervention(s): Monitored during session;Limited activity within patient's tolerance;Repositioned  Home Living                                          Prior Functioning/Environment              Frequency  Min 2X/week        Progress Toward Goals  OT Goals(current goals can now be found  in the care plan section)  Progress towards OT goals: Progressing toward goals     Plan Discharge plan needs to be updated    Co-evaluation                 AM-PAC PT "6 Clicks" Daily Activity     Outcome Measure   Help from another person eating meals?: None Help from another person taking care of personal grooming?: A Little Help from another person toileting, which includes using toliet, bedpan, or urinal?: A Lot Help from another person bathing (including washing, rinsing, drying)?: A Lot Help from another person to put on and taking off regular upper body clothing?: A Little Help from another person to put on and taking off regular lower body clothing?: A Lot 6 Click Score: 16    End of Session Equipment Utilized During Treatment: Rolling walker  OT Visit Diagnosis: Pain Pain - Right/Left: Left Pain - part of body: Leg   Activity Tolerance Patient limited by pain   Patient Left in bed;with call bell/phone within reach;with bed alarm set   Nurse Communication Mobility  status;Patient requests pain meds        Time: 8757-9728 OT Time Calculation (min): 17 min  Charges: OT General Charges $OT Visit: 1 Visit OT Treatments $Therapeutic Activity: 8-22 mins  Lucille Passy, OTR/L [place] Pager [phone] Office [phone]    Lucille Passy M 04/17/2018, 5:26 PM

## 2018-04-17 NOTE — Progress Notes (Signed)
Spoke with Dr. Justin Mend and Dr Donzetta Matters. Advised to cancel heparin for tonight. Can retry tomorrow.

## 2018-04-17 NOTE — Progress Notes (Addendum)
[facility] Progress Note   Subjective:   Seen and examined at bedside.  States he feels a little off today but can not say specifically what it is.  Admits to poor appetite and constipation.  Denies SOB, CP, palpitations, n/v/d.  States dialysis went well yesterday then thought I was taking him for dialysis again now.  Objective Vitals:   04/16/18 2300 04/17/18 0402 04/17/18 0817 04/17/18 0926  BP: 111/77 117/88 109/74 130/83  Pulse: 71 76 72 73  Resp: 15 20 20    Temp: 98.5 F (36.9 C) 97.9 F (36.6 C) 98 F (36.7 C)   TempSrc: Oral Oral Oral   SpO2: 100% 99% 100%   Weight:  75 kg    Height:       Physical Exam General:NAD, chronically ill appearing male, mildly confused Heart:RRR, no mrg Lungs:CTAB Abdomen:soft, NTND Extremities:2+edema on L, no edema on R Dialysis Access: LU AVF, +b/t   Filed Weights   04/16/18 0735 04/16/18 1142 04/17/18 0402  Weight: 74.5 kg 74.6 kg 75 kg    Intake/Output Summary (Last 24 hours) at 04/17/2018 1222 Last data filed at 04/17/2018 0400 Gross per 24 hour  Intake 1212.63 ml  Output -  Net 1212.63 ml    Additional Objective Labs: Basic Metabolic Panel: Recent Labs  Lab 04/13/18 0338 04/15/18 0727 04/16/18 0749  NA 137 134* 133*  K 5.4* 5.1 4.9  CL 96* 94* 92*  CO2 30 28 25   GLUCOSE 99 103* 103*  BUN 43* 50* 71*  CREATININE 9.24* 10.41* 12.75*  CALCIUM 7.9* 9.0 9.1  PHOS 6.3*  --  7.2*   Liver Function Tests: Recent Labs  Lab 04/11/18 0835 04/13/18 0338 04/16/18 0749  AST 19  --   --   ALT 10  --   --   ALKPHOS 58  --   --   BILITOT 0.5  --   --   PROT 7.3  --   --   ALBUMIN 3.2* 2.9* 2.5*   No results for input(s): LIPASE, AMYLASE in the last 168 hours. CBC: Recent Labs  Lab 04/11/18 0835 04/12/18 0346 04/13/18 0834 04/15/18 0727 04/16/18 0749  WBC 3.8* 3.1* 3.5* 5.4 5.4  HGB 11.4* 10.4* 9.6* 9.3* 8.6*  HCT 38.3* 34.7* 31.7* 30.3* 28.8*  MCV 110.1* 108.8* 108.2* 106.7* 106.7*  PLT 155  159 141* 141* 156   Blood Culture    Component Value Date/Time   SDES BLOOD RIGHT HAND 03/31/2018 0940   SPECREQUEST  03/31/2018 0940    BOTTLES DRAWN AEROBIC AND ANAEROBIC Blood Culture adequate volume   CULT  03/31/2018 0940    NO GROWTH 5 DAYS Performed at [facility], [place] [address], [place], [place] [address]    REPTSTATUS 04/05/2018 FINAL 03/31/2018 0940    Cardiac Enzymes: No results for input(s): CKTOTAL, CKMB, CKMBINDEX, TROPONINI in the last 168 hours. CBG: Recent Labs  Lab 04/16/18 1216 04/16/18 1624 04/16/18 2008  GLUCAP 90 94 109*   Iron Studies: No results for input(s): IRON, TIBC, TRANSFERRIN, FERRITIN in the last 72 hours. Lab Results  Component Value Date   INR 1.27 04/17/2018   INR 1.16 04/11/2018   INR 1.09 03/31/2018   Studies/Results: No results found.  Medications: . amiodarone 30 mg/hr (04/17/18 0924)  . heparin 1,300 Units/hr (04/17/18 0807)  . magnesium sulfate 1 - 4 g bolus IVPB     . aspirin EC  81 mg Oral Daily  . atorvastatin  40 mg Oral Daily  .  calcitRIOL  0.75 mcg Oral Q M,W,F-HD  . calcium carbonate  400 mg of elemental calcium Oral BID BM  . Chlorhexidine Gluconate Cloth  6 each Topical Q0600  . Chlorhexidine Gluconate Cloth  6 each Topical Q0600  . coumadin book   Does not apply Once  . docusate sodium  100 mg Oral Daily  . metoprolol succinate  25 mg Oral Daily  . multivitamin  1 tablet Oral QHS  . pantoprazole  40 mg Oral Daily  . sevelamer carbonate  2,400 mg Oral TID WC  . warfarin  5 mg Oral ONCE-1800  . warfarin   Does not apply Once  . Warfarin - Pharmacist Dosing Inpatient   Does not apply q1800    Dialysis Orders: [facility] MWF  4h  71.5kg  2/2 bath  P4  Hep 2000  L AVF -Sensipar 180 mg PO TIW -Calcitriol0.75 mcg PO TIW -Mircera 160mcg IV q 2 weeks (last dose 9/27) -Venofer100mg  IV q HD until 10/4  - amlodipine 5 qd/ metoprolol xl 50 qd  Assessment/Plan: 1. PAD s/p L fem-pop bypass per Dr. Donnetta Hutching on  10/2.  Per [facility] 2. L leg pain/swelling - improving. doppler negative for DVT 3. Chest pain likely due to New A fib - initially believed to be post op but has reoccurred.  Started on coumadin.  Echo shows EF 60-65% no significant valvular issues. Per cards to switch IV amiodarone to PO and uptitrate BB as OP to potentially stop amio & see if reoccurs.  4. Hyperkalemia - resolved, K 4.9.  2. ESRD - MWF schedule.  K 4.9. Continue per regular schedule. Orders written. 3. Anemia of CKD- Hgb 8.6. Order ESA to given w/HD tomorrow.  4. Secondary hyperparathyroidism - Ca improved, now corrected 10.3.  Resume normal Ca bath, continue VDRA Sensipar d/c - last pth 1241.  Will need to resume at some point if possible. Continue to follow trends. Phos ^, continue binders. 5. HTN/volume - BP currently well controlled. Metoprolol decreased due to hypotension. Not to EDW, no UF pulled yesterday. 4.5L over EDW, will titrate down volume as tolerated.  6. Nutrition - Renal diet with fluid restrictions.   Jen Mow, PA-C [facility] Pager: [phone] 04/17/2018,12:22 PM  LOS: 6 days   Pt seen, examined and agree w A/P as above.  Kelly Splinter MD [employer] pager [phone]   04/17/2018, 1:15 PM

## 2018-04-17 NOTE — Discharge Instructions (Signed)
° °[facility] ° °Discharge instructions ° °Lower Extremity Bypass Surgery ° °Please refer to the following instruction for your post-procedure care. Your surgeon or physician assistant will discuss any changes with you. ° °Activity ° °You are encouraged to walk as much as you can. You can slowly return to normal activities during the month after your surgery. Avoid strenuous activity and heavy lifting until your doctor tells you it's OK. Avoid activities such as vacuuming or swinging a golf club. Do not drive until your doctor give the OK and you are no longer taking prescription pain medications. It is also normal to have difficulty with sleep habits, eating and bowel movement after surgery. These will go away with time. ° °Bathing/Showering ° °Shower daily after you go home. Do not soak in a bathtub, hot tub, or swim until the incision heals completely. ° °Incision Care ° °Clean your incision with mild soap and water. Shower every day. Pat the area dry with a clean towel. You do not need a bandage unless otherwise instructed. Do not apply any ointments or creams to your incision. If you have open wounds you will be instructed how to care for them or a visiting nurse may be arranged for you. If you have staples or sutures along your incision they will be removed at your post-op appointment. You may have skin glue on your incision. Do not peel it off. It will come off on its own in about one week. ° °Wash the groin wound with soap and water daily and pat dry. (No tub bath-only shower)  Then put a dry gauze or washcloth in the groin to keep this area dry to help prevent wound infection.  Do this daily and as needed.  Do not use Vaseline or neosporin on your incisions.  Only use soap and water on your incisions and then protect and keep dry. ° °Diet ° °Resume your normal diet. There are no special food restrictions following this procedure. A low fat/ low cholesterol diet is  recommended for all patients with vascular disease. In order to heal from your surgery, it is CRITICAL to get adequate nutrition. Your body requires vitamins, minerals, and protein. Vegetables are the best source of vitamins and minerals. Vegetables also provide the perfect balance of protein. Processed food has little nutritional value, so try to avoid this. ° °Medications ° °Resume taking all your medications unless your doctor or physician assistant tells you not to. If your incision is causing pain, you may take over-the-counter pain relievers such as acetaminophen (Tylenol). If you were prescribed a stronger pain medication, please aware these medication can cause nausea and constipation. Prevent nausea by taking the medication with a snack or meal. Avoid constipation by drinking plenty of fluids and eating foods with high amount of fiber, such as fruits, vegetables, and grains. Take Colace 100 mg (an over-the-counter stool softener) twice a day as needed for constipation.  °Do not take Tylenol if you are taking prescription pain medications. ° °Follow Up ° °Our office will schedule a follow up appointment 2-3 weeks following discharge. ° °Please call us immediately for any of the following conditions ° °•Severe or worsening pain in your legs or feet while at rest or while walking •Increase pain, redness, warmth, or drainage (pus) from your incision site(s) °Fever of 101 degree or higher °The swelling in your leg with the bypass suddenly worsens and becomes more painful than when you were in the hospital °If you have   been instructed to feel your graft pulse then you should do so every day. If you can no longer feel this pulse, call the office immediately. Not all patients are given this instruction.  Leg swelling is common after leg bypass surgery.  The swelling should improve over a few months following surgery. To improve the swelling, you may elevate your legs above the level of your heart while you are  sitting or resting. Your surgeon or physician assistant may ask you to apply an ACE wrap or wear compression (TED) stockings to help to reduce swelling.  Reduce your risk of vascular disease  Stop smoking. If you would like help call [facility] at 1-800-QUIT-NOW 5074490108) or [place] at [phone].  Manage your cholesterol Maintain a desired weight Control your diabetes weight Control your diabetes Keep your blood pressure down  If you have any questions, please call the office at [phone]  Information on my medicine - Coumadin   (Warfarin)  This medication education was reviewed with me or my healthcare representative as part of my discharge preparation.  Why was Coumadin prescribed for you? Coumadin was prescribed for you because you have a blood clot or a medical condition that can cause an increased risk of forming blood clots. Blood clots can cause serious health problems by blocking the flow of blood to the heart, lung, or brain. Coumadin can prevent harmful blood clots from forming. As a reminder your indication for Coumadin is:   Stroke Prevention Because Of Atrial Fibrillation  What test will check on my response to Coumadin? While on Coumadin (warfarin) you will need to have an INR test regularly to ensure that your dose is keeping you in the desired range. The INR (international normalized ratio) number is calculated from the result of the laboratory test called prothrombin time (PT).  If an INR APPOINTMENT HAS NOT ALREADY BEEN MADE FOR YOU please schedule an appointment to have this lab work done by your health care provider within 7 days. Your INR goal is usually a number between:  2 to 3 or your provider may give you a more narrow range like 2-2.5.  Ask your health care provider during an office visit what your goal INR is.  What  do you need to  know  About  COUMADIN? Take Coumadin (warfarin) exactly as prescribed by your healthcare provider about the same  time each day.  DO NOT stop taking without talking to the doctor who prescribed the medication.  Stopping without other blood clot prevention medication to take the place of Coumadin may increase your risk of developing a new clot or stroke.  Get refills before you run out.  What do you do if you miss a dose? If you miss a dose, take it as soon as you remember on the same day then continue your regularly scheduled regimen the next day.  Do not take two doses of Coumadin at the same time.  Important Safety Information A possible side effect of Coumadin (Warfarin) is an increased risk of bleeding. You should call your healthcare provider right away if you experience any of the following: ? Bleeding from an injury or your nose that does not stop. ? Unusual colored urine (red or dark brown) or unusual colored stools (red or black). ? Unusual bruising for unknown reasons. ? A serious fall or if you hit your head (even if there is no bleeding).  Some foods or medicines interact with Coumadin (warfarin) and might alter your response to  warfarin. To help avoid this: ? Eat a balanced diet, maintaining a consistent amount of Vitamin K. ? Notify your provider about major diet changes you plan to make. ? Avoid alcohol or limit your intake to 1 drink for women and 2 drinks for men per day. (1 drink is 5 oz. wine, 12 oz. beer, or 1.5 oz. liquor.)  Make sure that ANY health care provider who prescribes medication for you knows that you are taking Coumadin (warfarin).  Also make sure the healthcare provider who is monitoring your Coumadin knows when you have started a new medication including herbals and non-prescription products.  Coumadin (Warfarin)  Major Drug Interactions  Increased Warfarin Effect Decreased Warfarin Effect  Alcohol (large quantities) Antibiotics (esp. Septra/Bactrim, Flagyl, Cipro) Amiodarone (Cordarone) Aspirin (ASA) Cimetidine (Tagamet) Megestrol (Megace) NSAIDs (ibuprofen,  naproxen, etc.) Piroxicam (Feldene) Propafenone (Rythmol SR) Propranolol (Inderal) Isoniazid (INH) Posaconazole (Noxafil) Barbiturates (Phenobarbital) Carbamazepine (Tegretol) Chlordiazepoxide (Librium) Cholestyramine (Questran) Griseofulvin Oral Contraceptives Rifampin Sucralfate (Carafate) Vitamin K   Coumadin (Warfarin) Major Herbal Interactions  Increased Warfarin Effect Decreased Warfarin Effect  Garlic Ginseng Ginkgo biloba Coenzyme Q10 Green tea St. Johns wort    Coumadin (Warfarin) FOOD Interactions  Eat a consistent number of servings per week of foods HIGH in Vitamin K (1 serving =  cup)  Collards (cooked, or boiled & drained) Kale (cooked, or boiled & drained) Mustard greens (cooked, or boiled & drained) Parsley *serving size only =  cup Spinach (cooked, or boiled & drained) Swiss chard (cooked, or boiled & drained) Turnip greens (cooked, or boiled & drained)  Eat a consistent number of servings per week of foods MEDIUM-HIGH in Vitamin K (1 serving = 1 cup)  Asparagus (cooked, or boiled & drained) Broccoli (cooked, boiled & drained, or raw & chopped) Brussel sprouts (cooked, or boiled & drained) *serving size only =  cup Lettuce, raw (green leaf, endive, romaine) Spinach, raw Turnip greens, raw & chopped   These websites have more information on Coumadin (warfarin):  FailFactory.se; VeganReport.com.au;

## 2018-04-18 LAB — CBC
HEMATOCRIT: 25.1 % — AB (ref 39.0–52.0)
Hemoglobin: 7.9 g/dL — ABNORMAL LOW (ref 13.0–17.0)
MCH: 32.1 pg (ref 26.0–34.0)
MCHC: 31.5 g/dL (ref 30.0–36.0)
MCV: 102 fL — AB (ref 80.0–100.0)
Platelets: 162 10*3/uL (ref 150–400)
RBC: 2.46 MIL/uL — ABNORMAL LOW (ref 4.22–5.81)
RDW: 14.9 % (ref 11.5–15.5)
WBC: 3.9 10*3/uL — ABNORMAL LOW (ref 4.0–10.5)
nRBC: 0 % (ref 0.0–0.2)

## 2018-04-18 LAB — RENAL FUNCTION PANEL
ANION GAP: 15 (ref 5–15)
Albumin: 2.2 g/dL — ABNORMAL LOW (ref 3.5–5.0)
BUN: 68 mg/dL — ABNORMAL HIGH (ref 8–23)
CALCIUM: 8.9 mg/dL (ref 8.9–10.3)
CHLORIDE: 94 mmol/L — AB (ref 98–111)
CO2: 21 mmol/L — AB (ref 22–32)
Creatinine, Ser: 10.5 mg/dL — ABNORMAL HIGH (ref 0.61–1.24)
GFR calc Af Amer: 5 mL/min — ABNORMAL LOW (ref >60)
GFR calc non Af Amer: 5 mL/min — ABNORMAL LOW (ref >60)
Glucose, Bld: 70 mg/dL (ref 70–99)
PHOSPHORUS: 5.4 mg/dL — AB (ref 2.5–4.6)
POTASSIUM: 4.9 mmol/L (ref 3.5–5.1)
Sodium: 130 mmol/L — ABNORMAL LOW (ref 135–145)

## 2018-04-18 LAB — HEPARIN LEVEL (UNFRACTIONATED)
Heparin Unfractionated: 0.22 IU/mL — ABNORMAL LOW (ref 0.30–0.70)
Heparin Unfractionated: 0.29 IU/mL — ABNORMAL LOW (ref 0.30–0.70)
Heparin Unfractionated: 0.32 IU/mL (ref 0.30–0.70)

## 2018-04-18 LAB — PROTIME-INR
INR: 1.68
PROTHROMBIN TIME: 19.6 s — AB (ref 11.4–15.2)

## 2018-04-18 MED ORDER — WARFARIN SODIUM 2.5 MG PO TABS
2.5000 mg | ORAL_TABLET | Freq: Once | ORAL | Status: AC
  Administered 2018-04-18: 2.5 mg via ORAL
  Filled 2018-04-18: qty 1

## 2018-04-18 MED ORDER — AMIODARONE HCL 200 MG PO TABS
200.0000 mg | ORAL_TABLET | Freq: Every day | ORAL | Status: DC
  Administered 2018-04-18 – 2018-04-19 (×2): 200 mg via ORAL
  Filled 2018-04-18 (×2): qty 1

## 2018-04-18 MED ORDER — PENTAFLUOROPROP-TETRAFLUOROETH EX AERO: 1.0000 "application " | INHALATION_SPRAY | CUTANEOUS | Status: DC | PRN

## 2018-04-18 MED ORDER — LIDOCAINE HCL (PF) 1 % IJ SOLN: 5.0000 mL | INTRAMUSCULAR | Status: DC | PRN

## 2018-04-18 MED ORDER — CALCITRIOL 0.25 MCG PO CAPS
ORAL_CAPSULE | ORAL | Status: AC
  Filled 2018-04-18: qty 1

## 2018-04-18 MED ORDER — LIDOCAINE-PRILOCAINE 2.5-2.5 % EX CREA: 1.0000 "application " | TOPICAL_CREAM | CUTANEOUS | Status: DC | PRN

## 2018-04-18 MED ORDER — METOPROLOL SUCCINATE ER 50 MG PO TB24
50.0000 mg | ORAL_TABLET | Freq: Every day | ORAL | Status: DC
  Administered 2018-04-19: 50 mg via ORAL
  Filled 2018-04-18 (×2): qty 1

## 2018-04-18 MED ORDER — ALTEPLASE 2 MG IJ SOLR: 2.0000 mg | Freq: Once | INTRAMUSCULAR | Status: DC | PRN

## 2018-04-18 MED ORDER — DARBEPOETIN ALFA 100 MCG/0.5ML IJ SOSY
PREFILLED_SYRINGE | INTRAMUSCULAR | Status: AC
  Administered 2018-04-18: 100 ug via INTRAVENOUS
  Filled 2018-04-18: qty 0.5

## 2018-04-18 MED ORDER — HEPARIN SODIUM (PORCINE) 1000 UNIT/ML DIALYSIS: 1000.0000 [IU] | INTRAMUSCULAR | Status: DC | PRN

## 2018-04-18 MED ORDER — SODIUM CHLORIDE 0.9 % IV SOLN: 100.0000 mL | INTRAVENOUS | Status: DC | PRN

## 2018-04-18 MED ORDER — CALCITRIOL 0.5 MCG PO CAPS
ORAL_CAPSULE | ORAL | Status: AC
  Filled 2018-04-18: qty 1

## 2018-04-18 NOTE — Clinical Social Work Note (Signed)
Clinical Social Work Assessment  Patient Details  Name: Raymond [name] MRN: [id number] Date of Birth: 11-Mar-1956  Date of referral:  04/18/18               Reason for consult:  Facility Placement, Discharge Planning                Permission sought to share information with:  Facility Art therapist granted to share information::     Name::        Agency::  SNFs  Relationship::     Contact Information:     Housing/Transportation Living arrangements for the past 2 months:  Apartment Source of Information:  Patient Patient Interpreter Needed:  None Criminal Activity/Legal Involvement Pertinent to Current Situation/Hospitalization:  No - Comment as needed Significant Relationships:  Siblings, Other(Comment)(niece) Lives with:  Other (Comment)(niece) Do you feel safe going back to the place where you live?  Yes Need for family participation in patient care:  No (Coment)  Care giving concerns: Patient from home with niece. PT recommending SNF. Patient goes to dialysis MWF at [facility].   Social Worker assessment / plan: CSW met with patient at bedside. Patient alert and oriented. CSW introduced self and role and discussed disposition planning - PT recommendation for SNF. Patient reported he lives at home with his niece, but she works during the day. Patient is agreeable to SNF. CSW provided list of bed offers for patient to review.   CSW to follow up for patient's SNF choice and will support with discharge planning.  Employment status:  Disabled (Comment on whether or not currently receiving Disability) Insurance information:  Medicare, Medicaid In [place] PT Recommendations:  [place] / Referral to community resources:  [place]  Patient/Family's Response to care: Patient appreciative of care.  Patient/Family's Understanding of and Emotional Response to Diagnosis, Current Treatment, and Prognosis: Patient  with understanding of his condition and recommendation for SNF. Patient agreeable to SNF.  Emotional Assessment Appearance:  Appears stated age Attitude/Demeanor/Rapport:  Engaged Affect (typically observed):  Accepting, Calm, Pleasant, Appropriate Orientation:  Oriented to Self, Oriented to Place, Oriented to  Time, Oriented to Situation Alcohol / Substance use:  Not Applicable Psych involvement (Current and /or in the community):  No (Comment)  Discharge Needs  Concerns to be addressed:  Discharge Planning Concerns, Care Coordination Readmission within the last 30 days:  Yes Current discharge risk:  Physical Impairment Barriers to Discharge:  Continued Medical Work up   Estanislado Emms, LCSW 04/18/2018, 2:41 PM

## 2018-04-18 NOTE — Progress Notes (Signed)
Physical Therapy Treatment Patient Details Name: Raymond [name] MRN: [id number] DOB: [birth year] Today's Date: 04/18/2018    History of Present Illness s/p L fem-pop BPG PMH: ESRD, COPD, HTN, Hep C, h/o substance abuse.    PT Comments    Patient progressing very slowly in terms of tolerance to moving the L LE as well as with tolerance to ambulation.  He seems convinced walking makes his leg worse due to swelling, though educated it helps circulation.  Continue to feel he will not be able to manage at home so recommend SNF level rehab.  PT to follow.  Follow Up Recommendations  SNF;Supervision/Assistance - 24 hour     Equipment Recommendations  3in1 (PT);Rolling walker with 5" wheels    Recommendations for Other Services       Precautions / Restrictions Precautions Precautions: Fall    Mobility  Bed Mobility Overal bed mobility: Needs Assistance Bed Mobility: Supine to Sit     Supine to sit: Min assist Sit to supine: Min assist   General bed mobility comments: assist for L LE  Transfers Overall transfer level: Needs assistance Equipment used: Rolling walker (2 wheeled) Transfers: Sit to/from Stand Sit to Stand: Min assist         General transfer comment: increased time for all mobility due to pain  Ambulation/Gait Ambulation/Gait assistance: Min assist Gait Distance (Feet): 30 Feet Assistive device: Rolling walker (2 wheeled) Gait Pattern/deviations: Step-to pattern;Antalgic;Trunk flexed     General Gait Details: severely self limited by pain despite cues and reasoning for mobilizing for circulation.     Stairs             Wheelchair Mobility    Modified Rankin (Stroke Patients Only)       Balance Overall balance assessment: Needs assistance Sitting-balance support: Single extremity supported Sitting balance-Leahy Scale: Good     Standing balance support: Bilateral upper extremity supported Standing balance-Leahy Scale: Poor Standing  balance comment: limtied weight bearingo n L LE                            Cognition Arousal/Alertness: Awake/alert Behavior During Therapy: Anxious Overall Cognitive Status: Within Functional Limits for tasks assessed                                        Exercises General Exercises - Lower Extremity Ankle Circles/Pumps: AROM;Both;10 reps;Supine Heel Slides: AAROM;AROM;5 reps;Both;Supine    General Comments        Pertinent Vitals/Pain Pain Assessment: Faces Faces Pain Scale: Hurts whole lot Pain Location: L LE Pain Descriptors / Indicators: Burning;Grimacing;Guarding Pain Intervention(s): Limited activity within patient's tolerance;Repositioned;Monitored during session    Home Living                      Prior Function            PT Goals (current goals can now be found in the care plan section) Progress towards PT goals: Progressing toward goals(slowly)    Frequency    Min 3X/week      PT Plan Current plan remains appropriate    Co-evaluation              AM-PAC PT "6 Clicks" Daily Activity  Outcome Measure  Difficulty turning over in bed (including adjusting bedclothes, sheets and blankets)?: Unable Difficulty moving from lying  on back to sitting on the side of the bed? : Unable Difficulty sitting down on and standing up from a chair with arms (e.g., wheelchair, bedside commode, etc,.)?: Unable Help needed moving to and from a bed to chair (including a wheelchair)?: A Little Help needed walking in hospital room?: A Little Help needed climbing 3-5 steps with a railing? : Total 6 Click Score: 10    End of Session Equipment Utilized During Treatment: Gait belt Activity Tolerance: Patient limited by pain Patient left: in bed;with call bell/phone within reach;with bed alarm set   PT Visit Diagnosis: Difficulty in walking, not elsewhere classified (R26.2);Pain Pain - Right/Left: Left Pain - part of body: Leg      Time: 5885-0277 PT Time Calculation (min) (ACUTE ONLY): 26 min  Charges:  $Gait Training: 8-22 mins $Therapeutic Exercise: 8-22 mins                     Magda Kiel, [place] [facility] [phone] 04/18/2018    Reginia Naas 04/18/2018, 5:13 PM

## 2018-04-18 NOTE — Progress Notes (Signed)
Progress Note  Patient Name: Raymond [name] Date of Encounter: 04/18/2018  Primary Cardiologist: Jenkins Rouge, MD   Subjective   Seen while undergoing hemodialysis.  Sleepy.  Complains of pain "everywhere".  No cardiac issues. Was in normal rhythm most of the last 24hours 10-minute bursts of atrial fibrillation overnight, but went back into atrial fibrillation around 0900 hrs. while on hemodialysis.  Ventricular rate is 110-100 at rest.  Blood pressure is tolerating dialysis and arrhythmia reasonably well  Inpatient Medications    Scheduled Meds: . aspirin EC  81 mg Oral Daily  . atorvastatin  40 mg Oral Daily  . calcitRIOL      . calcitRIOL      . calcitRIOL  0.75 mcg Oral Q M,W,F-HD  . calcium carbonate  400 mg of elemental calcium Oral BID BM  . Chlorhexidine Gluconate Cloth  6 each Topical Q0600  . Chlorhexidine Gluconate Cloth  6 each Topical Q0600  . Chlorhexidine Gluconate Cloth  6 each Topical Q0600  . coumadin book   Does not apply Once  . darbepoetin (ARANESP) injection - DIALYSIS  100 mcg Intravenous Q Wed-HD  . docusate sodium  100 mg Oral Daily  . [START ON 04/19/2018] metoprolol succinate  50 mg Oral Daily  . multivitamin  1 tablet Oral QHS  . pantoprazole  40 mg Oral Daily  . sevelamer carbonate  2,400 mg Oral TID WC  . warfarin   Does not apply Once  . Warfarin - Pharmacist Dosing Inpatient   Does not apply q1800   Continuous Infusions: . sodium chloride    . sodium chloride    . amiodarone 30 mg/hr (04/18/18 0954)  . heparin 1,450 Units/hr (04/18/18 0508)  . magnesium sulfate 1 - 4 g bolus IVPB     PRN Meds: sodium chloride, sodium chloride, acetaminophen **OR** acetaminophen, albuterol, alteplase, bisacodyl, gabapentin, guaiFENesin-dextromethorphan, heparin, labetalol, lidocaine (PF), lidocaine-prilocaine, magnesium sulfate 1 - 4 g bolus IVPB, morphine injection, nitroGLYCERIN, ondansetron, ondansetron, oxyCODONE-acetaminophen,  pentafluoroprop-tetrafluoroeth, phenol, polyethylene glycol   Vital Signs    Vitals:   04/18/18 0900 04/18/18 0930 04/18/18 1000 04/18/18 1030  BP: (!) 86/65 (!) 106/56 (!) 102/54 119/75  Pulse: (!) 109 100 (!) 52 98  Resp:      Temp:      TempSrc:      SpO2:      Weight:      Height:        Intake/Output Summary (Last 24 hours) at 04/18/2018 1153 Last data filed at 04/18/2018 0458 Gross per 24 hour  Intake 728.41 ml  Output -  Net 728.41 ml   Filed Weights   04/16/18 1142 04/17/18 0402 04/18/18 0800  Weight: 74.6 kg 75 kg 75.7 kg    Telemetry    Currently atrial fibrillation with mild RVR, mostly sinus rhythm with- Personally Reviewed  ECG    No new tracing- Personally Reviewed  Physical Exam  Curled up in a ball, on  dialysis. GEN: No acute distress.   Neck: No JVD Cardiac:  Irregular, no murmurs, rubs, or gallops.  Respiratory: Clear to auscultation bilaterally. GI: Soft, nontender, non-distended  MS: No edema; No deformity. Neuro:  Nonfocal  Psych: Normal affect   Labs    Chemistry Recent Labs  Lab 04/13/18 0338 04/15/18 0727 04/16/18 0749 04/18/18 0800  NA 137 134* 133* 130*  K 5.4* 5.1 4.9 4.9  CL 96* 94* 92* 94*  CO2 30 28 25  21*  GLUCOSE 99 103* 103* 70  BUN 43* 50* 71* 68*  CREATININE 9.24* 10.41* 12.75* 10.50*  CALCIUM 7.9* 9.0 9.1 8.9  ALBUMIN 2.9*  --  2.5* 2.2*  GFRNONAA 5* 5* 4* 5*  GFRAA 6* 5* 4* 5*  ANIONGAP 11 12 16* 15     Hematology Recent Labs  Lab 04/15/18 0727 04/16/18 0749 04/18/18 0300  WBC 5.4 5.4 3.9*  RBC 2.84* 2.70* 2.46*  HGB 9.3* 8.6* 7.9*  HCT 30.3* 28.8* 25.1*  MCV 106.7* 106.7* 102.0*  MCH 32.7 31.9 32.1  MCHC 30.7 29.9* 31.5  RDW 15.3 15.5 14.9  PLT 141* 156 162    Cardiac EnzymesNo results for input(s): TROPONINI in the last 168 hours. No results for input(s): TROPIPOC in the last 168 hours.   BNPNo results for input(s): BNP, PROBNP in the last 168 hours.   DDimer No results for input(s):  DDIMER in the last 168 hours.   Radiology    No results found.  Cardiac Studies   Left heart catheterization 03/29/18: Conclusions: 1. Mild, non-obstructive coronary artery disease. 2. Normal left ventricular systolic function with upper normal left ventricular filling pressure.  Recommendations: 1. Medical therapy and risk factor modification to prevent progression of disease. 2. Ok to proceed with vascular surgery from cardiac standpoint. 3. Hemodialysis tomorrow, as scheduled.  Recommend Aspirin 81mg  daily for peripheral vascular disease.  Echo Dec 06, 2016 - Left ventricle: The cavity size was normal. Wall thickness was   increased in a pattern of moderate LVH. Systolic function was   normal. The estimated ejection fraction was in the range of 55%   to 60%. Wall motion was normal; there were no regional wall   motion abnormalities. Doppler parameters are consistent with   abnormal left ventricular relaxation (grade 1 diastolic   dysfunction). The E/e&' ratio is between 8-15, suggesting   indeterminate LV filling pressure. - Aorta: Ascending aortic diameter: 42 mm (S). - Ascending aorta: The ascending aorta was mildly dilated. - Mitral valve: Calcified annulus. There was trivial regurgitation. - Left atrium: Moderately dilated. - Right atrium: The atrium was mildly dilated. - Atrial septum: Mobile IAS - cannot exclude small PFO. - Tricuspid valve: There was mild regurgitation. - Pulmonary arteries: PA peak pressure: 47 mm Hg (S). - Systemic veins: The IVC measures >2.1 cm, but collapses more than   50%, suggesting an elevated RA pressure of 8 mmHg.  Impressions:  - LVEF 55-60%, moderate LVH, normal wall motion, grade 1 DD with   indeterminate LV filling pressure, mildly dilated ascending aorta   to 4.2 cm, trivial MR, moderate LAE, mild RAE, mobile interatrial   septum, mild TR, RVSP 47 mmHg, dilated IVC suggestive of elevated   RA pressure of 8 mmHg.  Patient  Profile     62 y.o. male with past medical history of PVD, ESRD (HD - MWF), Hepatitis C, HTN, HLD, COPD, chronic anemia and tobacco usewho is being followed by cardiology for the evaluation ofchest pain, found to have new onset, recurrent episodes of paroxysmal atrial fibrillation.  Assessment & Plan    1. Atrial fibrillation with RVR:  Increase metoprolol to 50 mg daily. On coumadin for CHA2DS2-VASc Score 2 (HTN, Vascular).  Stop aspirin when INR is therapeutic  (1.6 today). 2. HTN:  Currently blood pressure on the low end of normal, prefer use of metoprolol for rate control agents 3. PVD: s/p left femoropopliteal bypass 04/11/18 - Continue management per primary team 4. ESRD: on HD M/W/F - Continue management per nephrology and primary team  For questions or updates, please contact [place] Please consult [email or web address] for contact info under        Signed, Sanda Klein, MD  04/18/2018, 11:53 AM

## 2018-04-18 NOTE — Progress Notes (Addendum)
[place] for warfarin and heparin IV Indication: atrial fibrillation  No Known Allergies  Patient Measurements: Height: 5\' 8"  (172.7 cm) Weight: 166 lb 14.2 oz (75.7 kg) IBW/kg (Calculated) : 68.4  Heparin dosing weight 74.5kg    Vital Signs: Temp: 98.6 F (37 C) (10/09 0800) Temp Source: Oral (10/09 0800) BP: (P) 77/64 (10/09 1130) Pulse Rate: (P) 77 (10/09 1130)  Labs: Recent Labs    04/16/18 0749 04/16/18 2108 04/17/18 0451 04/18/18 0300 04/18/18 0800  HGB 8.6*  --   --  7.9*  --   HCT 28.8*  --   --  25.1*  --   PLT 156  --   --  162  --   LABPROT  --   --  15.8* 19.6*  --   INR  --   --  1.27 1.68  --   HEPARINUNFRC  --  0.28* 0.27* 0.22*  --   CREATININE 12.75*  --   --   --  10.50*    Estimated Creatinine Clearance: 6.9 mL/min (A) (by C-G formula based on SCr of 10.5 mg/dL (H)).   Medical History: Past Medical History:  Diagnosis Date  . Anemia   . CAD (coronary artery disease)    a. 03/2018: cath showing 20 to 30% stenosis along the LAD, luminal irregularities along the RCA, and angiographically normal LCx  . COPD (chronic obstructive pulmonary disease) ([place])   . Dyspnea    "with too much fluid"  . ESRD (end stage renal disease) on dialysis [facility])    "MWF; Jeneen Rinks" (07/22/17)  . Hemodialysis patient ([place])   . Hepatitis C    Still positive s/p liver biopsy at [facility]  and interferon therapy for 6 months. Most recent lab work was on 10/24/12  . Hepatitis C    "took the tx; gone now" (12/05/2016)  Was treated  . History of blood transfusion ~ 2012/2013   "related to my kidneys; blood was low"  . Hypertension   . Substance abuse ([place])   . Thyroid disease     Medications:  Scheduled:  . amiodarone  200 mg Oral Daily  . aspirin EC  81 mg Oral Daily  . atorvastatin  40 mg Oral Daily  . calcitRIOL  0.75 mcg Oral Q M,W,F-HD  . calcium carbonate  400 mg of elemental calcium Oral BID BM  . Chlorhexidine  Gluconate Cloth  6 each Topical Q0600  . Chlorhexidine Gluconate Cloth  6 each Topical Q0600  . Chlorhexidine Gluconate Cloth  6 each Topical Q0600  . coumadin book   Does not apply Once  . darbepoetin (ARANESP) injection - DIALYSIS  100 mcg Intravenous Q Wed-HD  . docusate sodium  100 mg Oral Daily  . [START ON 04/19/2018] metoprolol succinate  50 mg Oral Daily  . multivitamin  1 tablet Oral QHS  . pantoprazole  40 mg Oral Daily  . sevelamer carbonate  2,400 mg Oral TID WC  . warfarin   Does not apply Once  . Warfarin - Pharmacist Dosing Inpatient   Does not apply q1800    Assessment: 62 yo male with ESRD and new diagnosis of atrial fibrillation.  Patient is now to be started on anticoagulation with heparin and warfarin per pharmacy. Patient started on amiodarone 200mg  daily, which may affect the INR. Baseline INR 1.16 on 10/3.   Heparin level pending for this afternoon after rate increase this AM. INR up to 1.68. Hg down some to 7.9 (  watch closely), plt improved. No bleed documented.  Goal of Therapy:  INR 2-3 Monitor platelets by anticoagulation protocol: Yes   Plan:  Continue heparin at 1450 units/hr Warfarin 2.5mg  PO x 1 1300 heparin level Monitor daily heparin level/INR/CBC, s/sx bleeding, DDI with amio  Elicia Lamp, PharmD, BCPS Clinical Pharmacist Clinical phone [phone] Please check AMION for all [place] contact numbers 04/18/2018 12:48 PM

## 2018-04-18 NOTE — Progress Notes (Addendum)
  Progress Note    04/18/2018 7:47 AM 7 Days Post-Op  Subjective: Soreness LLE this morning   Vitals:   04/17/18 2359 04/18/18 0458  BP: 132/80 105/68  Pulse: 76 77  Resp: 18 17  Temp: 98 F (36.7 C) 97.9 F (36.6 C)  SpO2: 98% 98%   Physical Exam Lungs:  Non labored Incisions:  Popliteal incision with serous collection on dressing, no drainage with manipulation; all other incisions c/d/i Extremities:  Palpable L DP Abdomen:  Soft Neurologic: A&O  CBC    Component Value Date/Time   WBC 3.9 (L) 04/18/2018 0300   RBC 2.46 (L) 04/18/2018 0300   HGB 7.9 (L) 04/18/2018 0300   HGB 11.0 (L) 03/27/2018 1420   HCT 25.1 (L) 04/18/2018 0300   HCT 32.7 (L) 03/27/2018 1420   PLT 162 04/18/2018 0300   PLT 174 03/27/2018 1420   MCV 102.0 (H) 04/18/2018 0300   MCV 101 (H) 03/27/2018 1420   MCH 32.1 04/18/2018 0300   MCHC 31.5 04/18/2018 0300   RDW 14.9 04/18/2018 0300   RDW 14.6 03/27/2018 1420   LYMPHSABS 0.4 (L) 03/31/2018 0907   LYMPHSABS 0.9 03/27/2018 1420   MONOABS 0.1 03/31/2018 0907   EOSABS 0.0 03/31/2018 0907   EOSABS 0.1 03/27/2018 1420   BASOSABS 0.0 03/31/2018 0907   BASOSABS 0.0 03/27/2018 1420    BMET    Component Value Date/Time   NA 133 (L) 04/16/2018 0749   NA 144 03/27/2018 1420   K 4.9 04/16/2018 0749   CL 92 (L) 04/16/2018 0749   CO2 25 04/16/2018 0749   GLUCOSE 103 (H) 04/16/2018 0749   BUN 71 (H) 04/16/2018 0749   BUN 57 (H) 03/27/2018 1420   CREATININE 12.75 (H) 04/16/2018 0749   CALCIUM 9.1 04/16/2018 0749   GFRNONAA 4 (L) 04/16/2018 0749   GFRAA 4 (L) 04/16/2018 0749    INR    Component Value Date/Time   INR 1.68 04/18/2018 0300     Intake/Output Summary (Last 24 hours) at 04/18/2018 0747 Last data filed at 04/18/2018 0458 Gross per 24 hour  Intake 728.41 ml  Output -  Net 728.41 ml     Assessment/Plan:  62 y.o. male is s/p L femoral to popliteal artery bypass graft 7 Days Post-Op   Perfusing L foot well with palpable  DP ESRD with HD on MWF schedule per Nephrology Encouraged participation with therapy teams Pharmacy dosing coumadin and heparin bridge CSW arranging SNF   Dagoberto Ligas, PA-C [facility] [phone] 04/18/2018 7:47 AM  I have examined the patient, reviewed and agree with above.  Seen in hemodialysis.  Back in atrial fibrillation.  Rate in the 100s.  Dynamically stable. All surgical incisions healing and palpable dorsalis pedis pulse  Curt Jews, MD 04/18/2018 2:49 PM

## 2018-04-18 NOTE — Progress Notes (Addendum)
[facility] Progress Note   Subjective:   Seen on HD.  Complains of pain all over.  States he was unable to ambulate yesterday due to pain.   Objective Vitals:   04/18/18 0900 04/18/18 0930 04/18/18 1000 04/18/18 1030  BP: (!) 86/65 (!) 106/56 (!) 102/54 119/75  Pulse: (!) 109 100 (!) 52 98  Resp:      Temp:      TempSrc:      SpO2:      Weight:      Height:       Physical Exam General:NAD, chronically ill appearing male Heart:regular rate, irregular rhythm Lungs:CTAB, BS decreased Abdomen:soft, NTND Extremities:1+ edema on L, no edema on R Dialysis Access: LU AVF cannulated   Filed Weights   04/16/18 1142 04/17/18 0402 04/18/18 0800  Weight: 74.6 kg 75 kg 75.7 kg    Intake/Output Summary (Last 24 hours) at 04/18/2018 1117 Last data filed at 04/18/2018 0458 Gross per 24 hour  Intake 728.41 ml  Output -  Net 728.41 ml    Additional Objective Labs: Basic Metabolic Panel: Recent Labs  Lab 04/13/18 0338 04/15/18 0727 04/16/18 0749 04/18/18 0800  NA 137 134* 133* 130*  K 5.4* 5.1 4.9 4.9  CL 96* 94* 92* 94*  CO2 30 28 25  21*  GLUCOSE 99 103* 103* 70  BUN 43* 50* 71* 68*  CREATININE 9.24* 10.41* 12.75* 10.50*  CALCIUM 7.9* 9.0 9.1 8.9  PHOS 6.3*  --  7.2* 5.4*   Liver Function Tests: Recent Labs  Lab 04/13/18 0338 04/16/18 0749 04/18/18 0800  ALBUMIN 2.9* 2.5* 2.2*   No results for input(s): LIPASE, AMYLASE in the last 168 hours. CBC: Recent Labs  Lab 04/12/18 0346 04/13/18 0834 04/15/18 0727 04/16/18 0749 04/18/18 0300  WBC 3.1* 3.5* 5.4 5.4 3.9*  HGB 10.4* 9.6* 9.3* 8.6* 7.9*  HCT 34.7* 31.7* 30.3* 28.8* 25.1*  MCV 108.8* 108.2* 106.7* 106.7* 102.0*  PLT 159 141* 141* 156 162   Blood Culture    Component Value Date/Time   SDES BLOOD RIGHT HAND 03/31/2018 0940   SPECREQUEST  03/31/2018 0940    BOTTLES DRAWN AEROBIC AND ANAEROBIC Blood Culture adequate volume   CULT  03/31/2018 0940    NO GROWTH 5 DAYS Performed at [facility], [place] [address] [place], [place] [address]    REPTSTATUS 04/05/2018 FINAL 03/31/2018 0940    Cardiac Enzymes: No results for input(s): CKTOTAL, CKMB, CKMBINDEX, TROPONINI in the last 168 hours. CBG: Recent Labs  Lab 04/16/18 1216 04/16/18 1624 04/16/18 2008  GLUCAP 90 94 109*   Iron Studies: No results for input(s): IRON, TIBC, TRANSFERRIN, FERRITIN in the last 72 hours. Lab Results  Component Value Date   INR 1.68 04/18/2018   INR 1.27 04/17/2018   INR 1.16 04/11/2018   Studies/Results: No results found.  Medications: . sodium chloride    . sodium chloride    . amiodarone 30 mg/hr (04/18/18 0954)  . heparin 1,450 Units/hr (04/18/18 0508)  . magnesium sulfate 1 - 4 g bolus IVPB     . aspirin EC  81 mg Oral Daily  . atorvastatin  40 mg Oral Daily  . calcitRIOL      . calcitRIOL      . calcitRIOL  0.75 mcg Oral Q M,W,F-HD  . calcium carbonate  400 mg of elemental calcium Oral BID BM  . Chlorhexidine Gluconate Cloth  6 each Topical Q0600  . Chlorhexidine Gluconate Cloth  6 each  Topical Q0600  . Chlorhexidine Gluconate Cloth  6 each Topical Q0600  . coumadin book   Does not apply Once  . Darbepoetin Alfa      . darbepoetin (ARANESP) injection - DIALYSIS  100 mcg Intravenous Q Wed-HD  . docusate sodium  100 mg Oral Daily  . metoprolol succinate  25 mg Oral Daily  . multivitamin  1 tablet Oral QHS  . pantoprazole  40 mg Oral Daily  . sevelamer carbonate  2,400 mg Oral TID WC  . warfarin   Does not apply Once  . Warfarin - Pharmacist Dosing Inpatient   Does not apply q1800    Dialysis Orders: [facility] MWF  4h 71.5kg 2/2 bath P4 Hep 2000 L AVF -Sensipar 180 mg PO TIW -Calcitriol0.75 mcg PO TIW -Mircera 185mcg IV q 2 weeks (last dose 9/27) -Venofer100mg  IV q HD until 10/4  - amlodipine 5 qd/ metoprolol xl 50 qd  Assessment/Plan: 1. PAD s/p L fem-pop bypass per Dr. Donnetta Hutching on 10/2.  Per [facility] 2. Chest pain likely due to New A fib - initially  believed to be post op but has reoccurred.  Started on coumadin.  Echo shows EF 60-65% no significant valvular issues. Per cards to switch IV amiodarone to PO and uptitrate BB as OP to potentially stop amio & see if reoccurs.  3. ESRD - MWF schedule.  K 4.9. HD today, continue per regular schedule while admitted.  4. Anemia of CKD- Hgb 7.9. Aranesp 144mcg qWed starting today. 5. Secondary hyperparathyroidism - Ca improved, now corrected 10.3.  Phos in goal. Resume normal Ca bath, continue VDRA Sensipar d/c - last pth 1241.  Will need to resume at some point if possible. Continue to follow trends.  6. HTN/volume - BP currently well controlled. Metoprolol decreased due to hypotension. Net UF goal 2.5L. Not meeting edw. Continue to titrate down volume as tolerated.   7. Nutrition - Renal diet with fluid restrictions.   Jen Mow, PA-C [facility] Pager: [phone] 04/18/2018,11:17 AM  LOS: 7 days   Pt seen, examined and agree w A/P as above.  Kelly Splinter MD [employer] pager [phone]   04/18/2018, 11:34 AM

## 2018-04-18 NOTE — Progress Notes (Signed)
PT Cancellation Note  Patient Details Name: Raymond [name] MRN: [id number] DOB: 07/22/1955   Cancelled Treatment:    Reason Eval/Treat Not Completed: Patient at procedure or test/unavailable; in HD.  Will attempt later as time permits.   Reginia Naas 04/18/2018, 10:51 AM  Magda Kiel, [place] [phone] 04/18/2018

## 2018-04-18 NOTE — Progress Notes (Signed)
[place] for warfarin and heparin IV Indication: atrial fibrillation  No Known Allergies  Patient Measurements: Height: 5\' 8"  (172.7 cm) Weight: 166 lb 14.2 oz (75.7 kg) IBW/kg (Calculated) : 68.4  Heparin dosing weight 74.5kg    Vital Signs: Temp: 98.6 F (37 C) (10/09 0800) Temp Source: Oral (10/09 0800) BP: 77/64 (10/09 1130) Pulse Rate: 77 (10/09 1130)  Labs: Recent Labs    04/16/18 0749  04/17/18 0451 04/18/18 0300 04/18/18 0800 04/18/18 1313  HGB 8.6*  --   --  7.9*  --   --   HCT 28.8*  --   --  25.1*  --   --   PLT 156  --   --  162  --   --   LABPROT  --   --  15.8* 19.6*  --   --   INR  --   --  1.27 1.68  --   --   HEPARINUNFRC  --    < > 0.27* 0.22*  --  0.29*  CREATININE 12.75*  --   --   --  10.50*  --    < > = values in this interval not displayed.    Estimated Creatinine Clearance: 6.9 mL/min (A) (by C-G formula based on SCr of 10.5 mg/dL (H)).   Medical History: Past Medical History:  Diagnosis Date  . Anemia   . CAD (coronary artery disease)    a. 03/2018: cath showing 20 to 30% stenosis along the LAD, luminal irregularities along the RCA, and angiographically normal LCx  . COPD (chronic obstructive pulmonary disease) ([place])   . Dyspnea    "with too much fluid"  . ESRD (end stage renal disease) on dialysis [facility])    "MWF; Jeneen Rinks" (07/22/17)  . Hemodialysis patient ([place])   . Hepatitis C    Still positive s/p liver biopsy at [facility]  and interferon therapy for 6 months. Most recent lab work was on 10/24/12  . Hepatitis C    "took the tx; gone now" (12/05/2016)  Was treated  . History of blood transfusion ~ 2012/2013   "related to my kidneys; blood was low"  . Hypertension   . Substance abuse ([place])   . Thyroid disease     Medications:  Scheduled:  . amiodarone  200 mg Oral Daily  . aspirin EC  81 mg Oral Daily  . atorvastatin  40 mg Oral Daily  . calcitRIOL  0.75 mcg Oral Q M,W,F-HD  . calcium  carbonate  400 mg of elemental calcium Oral BID BM  . Chlorhexidine Gluconate Cloth  6 each Topical Q0600  . Chlorhexidine Gluconate Cloth  6 each Topical Q0600  . Chlorhexidine Gluconate Cloth  6 each Topical Q0600  . coumadin book   Does not apply Once  . darbepoetin (ARANESP) injection - DIALYSIS  100 mcg Intravenous Q Wed-HD  . docusate sodium  100 mg Oral Daily  . [START ON 04/19/2018] metoprolol succinate  50 mg Oral Daily  . multivitamin  1 tablet Oral QHS  . pantoprazole  40 mg Oral Daily  . sevelamer carbonate  2,400 mg Oral TID WC  . warfarin  2.5 mg Oral ONCE-1800  . warfarin   Does not apply Once  . Warfarin - Pharmacist Dosing Inpatient   Does not apply q1800    Assessment: 62 yo male with ESRD and new diagnosis of atrial fibrillation.  Patient is now to be started on anticoagulation with heparin and warfarin  per pharmacy. Patient started on amiodarone 200mg  daily, which may affect the INR. Baseline INR 1.16 on 10/3.   Heparin level pending for this afternoon after rate increase this AM. INR up to 1.68. Hg down some to 7.9 (watch closely), plt improved. No bleed documented.  Goal of Therapy:  INR 2-3 Monitor platelets by anticoagulation protocol: Yes   Plan:  Continue heparin at 1450 units/hr Warfarin 2.5mg  PO x 1 1300 heparin level Monitor daily heparin level/INR/CBC, s/sx bleeding, DDI with amio  Elicia Lamp, PharmD, BCPS Clinical Pharmacist Clinical phone [phone] Please check AMION for all [place] contact numbers 04/18/2018 2:30 PM  ADDENDUM:  Heparin level slightly low at 0.29. No reported bleeding or issues with infusion per discussion with RN but states the patient was in HD for 4 hours.  Plan: Increase heparin to 1550 units/hr Warfarin 2.5mg  PO x 1 8h heparin level Monitor daily heparin level/INR/CBC, s/sx bleeding, DDI with amio  Elicia Lamp, PharmD, BCPS Please check AMION for all [place] contact numbers Clinical Pharmacist 04/18/2018 2:31  PM

## 2018-04-18 NOTE — Progress Notes (Signed)
[place] for IV Heparin  Indication: atrial fibrillation  No Known Allergies  Patient Measurements: Height: 5\' 8"  (172.7 cm) Weight: 165 lb 5.5 oz (75 kg) IBW/kg (Calculated) : 68.4  Heparin dosing weight 74.5kg    Vital Signs: Temp: 98 F (36.7 C) (10/08 2359) Temp Source: Oral (10/08 2359) BP: 132/80 (10/08 2359) Pulse Rate: 76 (10/08 2359)  Labs: Recent Labs    04/15/18 0727 04/16/18 0749 04/16/18 2108 04/17/18 0451 04/18/18 0300  HGB 9.3* 8.6*  --   --  7.9*  HCT 30.3* 28.8*  --   --  25.1*  PLT 141* 156  --   --  162  LABPROT  --   --   --  15.8* 19.6*  INR  --   --   --  1.27 1.68  HEPARINUNFRC  --   --  0.28* 0.27* 0.22*  CREATININE 10.41* 12.75*  --   --   --     Estimated Creatinine Clearance: 5.7 mL/min (A) (by C-G formula based on SCr of 12.75 mg/dL (H)).   Medical History: Past Medical History:  Diagnosis Date  . Anemia   . CAD (coronary artery disease)    a. 03/2018: cath showing 20 to 30% stenosis along the LAD, luminal irregularities along the RCA, and angiographically normal LCx  . COPD (chronic obstructive pulmonary disease) ([place])   . Dyspnea    "with too much fluid"  . ESRD (end stage renal disease) on dialysis [facility])    "MWF; Jeneen Rinks" (07/22/17)  . Hemodialysis patient ([place])   . Hepatitis C    Still positive s/p liver biopsy at [facility]  and interferon therapy for 6 months. Most recent lab work was on 10/24/12  . Hepatitis C    "took the tx; gone now" (12/05/2016)  Was treated  . History of blood transfusion ~ 2012/2013   "related to my kidneys; blood was low"  . Hypertension   . Substance abuse ([place])   . Thyroid disease     Medications:  Scheduled:  . aspirin EC  81 mg Oral Daily  . atorvastatin  40 mg Oral Daily  . calcitRIOL  0.75 mcg Oral Q M,W,F-HD  . calcium carbonate  400 mg of elemental calcium Oral BID BM  . Chlorhexidine Gluconate Cloth  6 each Topical Q0600  . Chlorhexidine  Gluconate Cloth  6 each Topical Q0600  . Chlorhexidine Gluconate Cloth  6 each Topical Q0600  . coumadin book   Does not apply Once  . darbepoetin (ARANESP) injection - DIALYSIS  100 mcg Intravenous Q Wed-HD  . docusate sodium  100 mg Oral Daily  . metoprolol succinate  25 mg Oral Daily  . multivitamin  1 tablet Oral QHS  . pantoprazole  40 mg Oral Daily  . sevelamer carbonate  2,400 mg Oral TID WC  . warfarin   Does not apply Once  . Warfarin - Pharmacist Dosing Inpatient   Does not apply q1800    Assessment: 62 yo male with ESRD and new diagnosis of atrial fibrillation.  Patient is now to be started on anticoagulation with heparin and warfarin per pharmacy. Patient currently on an Amiodarone infusion which may affect the INR. No current bleeding noted. Baseline INR 1.16 on 10/3.   10/9 AM update: heparin level low this AM at 0.22, Hgb down some (watch closely)  Goal of Therapy:  Heparin level 0.3-0.7 units/mL Monitor platelets by anticoagulation protocol: Yes   Plan:  Inc heparin  to 1450 units/hr 1300 HL Daily CBC/HL Watch Hgb  Narda Bonds, PharmD, BCPS Clinical Pharmacist Phone: [phone]

## 2018-04-19 LAB — CBC
HEMATOCRIT: 23.3 % — AB (ref 39.0–52.0)
Hemoglobin: 7.4 g/dL — ABNORMAL LOW (ref 13.0–17.0)
MCH: 32.2 pg (ref 26.0–34.0)
MCHC: 31.8 g/dL (ref 30.0–36.0)
MCV: 101.3 fL — ABNORMAL HIGH (ref 80.0–100.0)
Platelets: 165 10*3/uL (ref 150–400)
RBC: 2.3 MIL/uL — ABNORMAL LOW (ref 4.22–5.81)
RDW: 15.3 % (ref 11.5–15.5)
WBC: 4 10*3/uL (ref 4.0–10.5)
nRBC: 0 % (ref 0.0–0.2)

## 2018-04-19 LAB — PROTIME-INR
INR: 2.92
PROTHROMBIN TIME: 30.3 s — AB (ref 11.4–15.2)

## 2018-04-19 LAB — HEPARIN LEVEL (UNFRACTIONATED): Heparin Unfractionated: 0.34 IU/mL (ref 0.30–0.70)

## 2018-04-19 MED ORDER — CHLORHEXIDINE GLUCONATE CLOTH 2 % EX PADS
6.0000 | MEDICATED_PAD | Freq: Every day | CUTANEOUS | Status: DC
  Administered 2018-04-19 – 2018-04-20 (×2): 6 via TOPICAL

## 2018-04-19 MED ORDER — CINACALCET HCL 30 MG PO TABS
180.0000 mg | ORAL_TABLET | ORAL | Status: DC
  Administered 2018-04-20: 180 mg via ORAL
  Filled 2018-04-19: qty 6

## 2018-04-19 MED ORDER — NEPRO/CARBSTEADY PO LIQD
237.0000 mL | Freq: Three times a day (TID) | ORAL | Status: DC
  Administered 2018-04-19 – 2018-04-21 (×5): 237 mL via ORAL

## 2018-04-19 MED ORDER — AMIODARONE HCL 200 MG PO TABS
400.0000 mg | ORAL_TABLET | Freq: Every day | ORAL | Status: DC
  Administered 2018-04-20 – 2018-04-21 (×2): 400 mg via ORAL
  Filled 2018-04-19 (×2): qty 2

## 2018-04-19 NOTE — Progress Notes (Addendum)
[facility] Progress Note   Subjective:   Feeling better today. Still not able to walk due to pain. No new complaints.   Objective Vitals:   04/18/18 2000 04/19/18 0000 04/19/18 0400 04/19/18 0757  BP: 120/77 103/65 110/76 114/67  Pulse: 87 77  (!) 103  Resp: 18 20 (!) 22 19  Temp: (!) 100.5 F (38.1 C) 99.1 F (37.3 C) 98.9 F (37.2 C) 98.3 F (36.8 C)  TempSrc: Axillary Oral Oral Oral  SpO2:    95%  Weight:      Height:       Physical Exam General:NAD, chronically ill appearing male Heart: regular rate, irregular rhythm Lungs:CTAB, BS decreased Abdomen:soft, NTND, no masses Extremities:1+ edema and tenderness on L, no edema on R Dialysis Access: LU AVF +b/t  Neuro: AAOx3  Filed Weights   04/17/18 0402 04/18/18 0800 04/18/18 1212  Weight: 75 kg 75.7 kg 74.2 kg    Intake/Output Summary (Last 24 hours) at 04/19/2018 1130 Last data filed at 04/19/2018 0047 Gross per 24 hour  Intake 289.76 ml  Output 1558 ml  Net -1268.24 ml    Additional Objective Labs: Basic Metabolic Panel: Recent Labs  Lab 04/13/18 0338 04/15/18 0727 04/16/18 0749 04/18/18 0800  NA 137 134* 133* 130*  K 5.4* 5.1 4.9 4.9  CL 96* 94* 92* 94*  CO2 30 28 25  21*  GLUCOSE 99 103* 103* 70  BUN 43* 50* 71* 68*  CREATININE 9.24* 10.41* 12.75* 10.50*  CALCIUM 7.9* 9.0 9.1 8.9  PHOS 6.3*  --  7.2* 5.4*   Liver Function Tests: Recent Labs  Lab 04/13/18 0338 04/16/18 0749 04/18/18 0800  ALBUMIN 2.9* 2.5* 2.2*   CBC: Recent Labs  Lab 04/13/18 0834 04/15/18 0727 04/16/18 0749 04/18/18 0300 04/19/18 0018  WBC 3.5* 5.4 5.4 3.9* 4.0  HGB 9.6* 9.3* 8.6* 7.9* 7.4*  HCT 31.7* 30.3* 28.8* 25.1* 23.3*  MCV 108.2* 106.7* 106.7* 102.0* 101.3*  PLT 141* 141* 156 162 165   CBG: Recent Labs  Lab 04/16/18 1216 04/16/18 1624 04/16/18 2008  GLUCAP 90 94 109*    Lab Results  Component Value Date   INR 2.92 04/19/2018   INR 1.68 04/18/2018   INR 1.27 04/17/2018    Studies/Results: No results found.  Medications: . magnesium sulfate 1 - 4 g bolus IVPB     . [START ON 04/20/2018] amiodarone  400 mg Oral Daily  . atorvastatin  40 mg Oral Daily  . calcitRIOL  0.75 mcg Oral Q M,W,F-HD  . calcium carbonate  400 mg of elemental calcium Oral BID BM  . Chlorhexidine Gluconate Cloth  6 each Topical Q0600  . Chlorhexidine Gluconate Cloth  6 each Topical Q0600  . Chlorhexidine Gluconate Cloth  6 each Topical Q0600  . coumadin book   Does not apply Once  . darbepoetin (ARANESP) injection - DIALYSIS  100 mcg Intravenous Q Wed-HD  . docusate sodium  100 mg Oral Daily  . metoprolol succinate  50 mg Oral Daily  . multivitamin  1 tablet Oral QHS  . pantoprazole  40 mg Oral Daily  . sevelamer carbonate  2,400 mg Oral TID WC  . warfarin   Does not apply Once  . Warfarin - Pharmacist Dosing Inpatient   Does not apply q1800    Dialysis Orders: [facility] MWF  4h 71.5kg 2/2 bath P4 Hep 2000 L AVF -Sensipar 180 mg PO TIW -Calcitriol0.75 mcg PO TIW -Mircera 153mcg IV q 2 weeks (last dose 9/27) -Venofer100mg   IV q HD until 10/4  - amlodipine 5 qd/ metoprolol xl 50 qd  Assessment/Plan: 1.PAD s/p L fem-pop bypass per Dr. Donnetta Hutching on 10/2. Per VVS 2. Chest pain likely due to New A fib - initially believed to be post op but has reoccurred. Started on coumadin. Echo shows EF 60-65% no significant valvular issues. Per cards to switch IV amiodarone to PO and uptitrate BB as OP to potentially stop amio &see if reoccurs.  3. ESRD -MWF schedule. last K 4.9. HD yesterday with net 1.6L removed.  Orders written for tomorrow per regular schedule.  4. Anemia of CKD-Hgb 7.4. Aranesp 149mcg qWed starting 10/9.  Check iron studies pre HD. Follow trends.  5. Secondary hyperparathyroidism -Ca improved, now corrected 10.3. Phos in goal. Resume normal Ca bath, continue VDRA Last pth 1421, resume sensipar. Continue to follow trends.  6. HTN/volume -BP currently well  controlled. Metoprolol increased back to home dose. Not meeting edw, was close as OP, 3L over today. Continue to titrate down volume as tolerated.   7. Nutrition -Renal diet with fluid restrictions.Renavite. [place].  Jen Mow, PA-C [facility] Pager: [phone] 04/19/2018,11:30 AM  LOS: 8 days   Pt seen, examined and agree w A/P as above.  Kelly Splinter MD [employer] pager [phone]   04/19/2018, 12:19 PM

## 2018-04-19 NOTE — Progress Notes (Signed)
[place] for warfarin and heparin IV Indication: atrial fibrillation  No Known Allergies  Patient Measurements: Height: 5\' 8"  (172.7 cm) Weight: 163 lb 9.3 oz (74.2 kg) IBW/kg (Calculated) : 68.4  Heparin dosing weight 74.5kg    Vital Signs: Temp: 98.3 F (36.8 C) (10/10 0757) Temp Source: Oral (10/10 0757) BP: 114/67 (10/10 0757) Pulse Rate: 103 (10/10 0757)  Labs: Recent Labs    04/17/18 0451 04/18/18 0300 04/18/18 0800 04/18/18 1313 04/18/18 2255 04/19/18 0018 04/19/18 0654  HGB  --  7.9*  --   --   --  7.4*  --   HCT  --  25.1*  --   --   --  23.3*  --   PLT  --  162  --   --   --  165  --   LABPROT 15.8* 19.6*  --   --   --  30.3*  --   INR 1.27 1.68  --   --   --  2.92  --   HEPARINUNFRC 0.27* 0.22*  --  0.29* 0.32  --  0.34  CREATININE  --   --  10.50*  --   --   --   --     Estimated Creatinine Clearance: 6.9 mL/min (A) (by C-G formula based on SCr of 10.5 mg/dL (H)).   Medical History: Past Medical History:  Diagnosis Date  . Anemia   . CAD (coronary artery disease)    a. 03/2018: cath showing 20 to 30% stenosis along the LAD, luminal irregularities along the RCA, and angiographically normal LCx  . COPD (chronic obstructive pulmonary disease) ([place])   . Dyspnea    "with too much fluid"  . ESRD (end stage renal disease) on dialysis [facility])    "MWF; J[facility] Rinks" (07/22/17)  . Hemodialysis patient ([place])   . Hepatitis C    Still positive s/p liver biopsy at [facility]  and interferon therapy for 6 months. Most recent lab work was on 10/24/12  . Hepatitis C    "took the tx; gone now" (12/05/2016)  Was treated  . History of blood transfusion ~ 2012/2013   "related to my kidneys; blood was low"  . Hypertension   . Substance abuse ([place])   . Thyroid disease     Medications:  Scheduled:  . amiodarone  200 mg Oral Daily  . aspirin EC  81 mg Oral Daily  . atorvastatin  40 mg Oral Daily  . calcitRIOL  0.75 mcg Oral Q  M,W,F-HD  . calcium carbonate  400 mg of elemental calcium Oral BID BM  . Chlorhexidine Gluconate Cloth  6 each Topical Q0600  . Chlorhexidine Gluconate Cloth  6 each Topical Q0600  . Chlorhexidine Gluconate Cloth  6 each Topical Q0600  . coumadin book   Does not apply Once  . darbepoetin (ARANESP) injection - DIALYSIS  100 mcg Intravenous Q Wed-HD  . docusate sodium  100 mg Oral Daily  . metoprolol succinate  50 mg Oral Daily  . multivitamin  1 tablet Oral QHS  . pantoprazole  40 mg Oral Daily  . sevelamer carbonate  2,400 mg Oral TID WC  . warfarin   Does not apply Once  . Warfarin - Pharmacist Dosing Inpatient   Does not apply q1800    Assessment: 62 yo male with ESRD and new diagnosis of atrial fibrillation.  Patient is now to be started on anticoagulation with heparin and warfarin per pharmacy. Patient started on  amiodarone 200mg  daily, which may affect the INR. Baseline INR 1.16 on 10/3.   Heparin level remains therapeutic. INR with large jump overnight from 1.68 to 2.92 - likely due to interaction with amiodarone. Hg down a bit to 7.4 (watch closely), plt stable. No bleed documented.  Goal of Therapy:  INR 2-3 Monitor platelets by anticoagulation protocol: Yes   Plan:  D/c heparin with therapeutic INR Hold warfarin tonight with large INR jump - likely needs lower doses on amiodarone Monitor daily INR, CBC, s/sx bleeding, DDI with amio  Elicia Lamp, PharmD, BCPS Clinical Pharmacist Clinical phone [phone] Please check AMION for all [place] contact numbers 04/19/2018 9:25 AM

## 2018-04-19 NOTE — Progress Notes (Signed)
Physical Therapy Treatment Patient Details Name: Raymond [name] MRN: [id number] DOB: Feb 20, 1956 Today's Date: 04/19/2018    History of Present Illness s/p L fem-pop BPG PMH: ESRD, COPD, HTN, Hep C, h/o substance abuse.    PT Comments    Patient is progressing very well towards their physical therapy goals as evidenced by increased ambulation distance to 80 feet using walker and min guard assist. Continues to display decreased LLE pain tolerance, gait speed, gait abnormalities, balance deficits, and decreased endurance. D/c plan remains appropriate.    Follow Up Recommendations  SNF;Supervision/Assistance - 24 hour     Equipment Recommendations  3in1 (PT);Rolling walker with 5" wheels    Recommendations for Other Services       Precautions / Restrictions Precautions Precautions: Fall Restrictions Weight Bearing Restrictions: No    Mobility  Bed Mobility Overal bed mobility: Needs Assistance Bed Mobility: Supine to Sit     Supine to sit: Min assist Sit to supine: Min assist   General bed mobility comments: min assist for LLE negotiation  Transfers Overall transfer level: Needs assistance Equipment used: Rolling walker (2 wheeled) Transfers: Sit to/from Stand Sit to Stand: Min guard         General transfer comment: decreased eccentric control with transition from stand to sit due to pain  Ambulation/Gait Ambulation/Gait assistance: Min guard Gait Distance (Feet): 80 Feet Assistive device: Rolling walker (2 wheeled) Gait Pattern/deviations: Step-to pattern;Antalgic;Trunk flexed Gait velocity: slow Gait velocity interpretation: <1.31 ft/sec, indicative of household ambulator General Gait Details: cues for larger right step length and rolling walker rather than picking it up   Stairs             Wheelchair Mobility    Modified Rankin (Stroke Patients Only)       Balance Overall balance assessment: Needs assistance Sitting-balance support: Feet  supported Sitting balance-Leahy Scale: Good     Standing balance support: Bilateral upper extremity supported Standing balance-Leahy Scale: Poor                              Cognition Arousal/Alertness: Awake/alert Behavior During Therapy: WFL for tasks assessed/performed Overall Cognitive Status: Within Functional Limits for tasks assessed                                        Exercises General Exercises - Lower Extremity Ankle Circles/Pumps: AROM;Both;10 reps;Supine Quad Sets: AROM;Both;10 reps;Supine    General Comments        Pertinent Vitals/Pain Pain Assessment: Faces Faces Pain Scale: Hurts whole lot Pain Location: L LE with movement Pain Descriptors / Indicators: Burning;Grimacing;Guarding Pain Intervention(s): Monitored during session  Vitals: HR 89-97 bpm    Home Living                      Prior Function            PT Goals (current goals can now be found in the care plan section) Acute Rehab PT Goals Patient Stated Goal: to go home Potential to Achieve Goals: Good Progress towards PT goals: Progressing toward goals    Frequency    Min 3X/week      PT Plan Current plan remains appropriate    Co-evaluation              AM-PAC PT "6 Clicks" Daily Activity  Outcome  Measure  Difficulty turning over in bed (including adjusting bedclothes, sheets and blankets)?: A Lot Difficulty moving from lying on back to sitting on the side of the bed? : Unable Difficulty sitting down on and standing up from a chair with arms (e.g., wheelchair, bedside commode, etc,.)?: Unable Help needed moving to and from a bed to chair (including a wheelchair)?: A Little Help needed walking in hospital room?: A Little Help needed climbing 3-5 steps with a railing? : A Lot 6 Click Score: 12    End of Session Equipment Utilized During Treatment: Gait belt Activity Tolerance: Patient tolerated treatment well Patient left: in  bed;with call bell/phone within reach;with bed alarm set   PT Visit Diagnosis: Difficulty in walking, not elsewhere classified (R26.2);Pain Pain - Right/Left: Left Pain - part of body: Leg     Time: 3710-6269 PT Time Calculation (min) (ACUTE ONLY): 17 min  Charges:  $Gait Training: 8-22 mins                     Ellamae Sia, PT, DPT Acute Rehabilitation Services Pager [phone] Office [phone]    Willy Eddy 04/19/2018, 2:58 PM

## 2018-04-19 NOTE — Plan of Care (Signed)
  Problem: Education: Goal: Required Educational Video(s) Outcome: Progressing   Problem: Clinical Measurements: Goal: Postoperative complications will be avoided or minimized Outcome: Progressing

## 2018-04-19 NOTE — Progress Notes (Signed)
Progress Note  Patient Name: Raymond [name] Date of Encounter: 04/19/2018  Primary Cardiologist: Jenkins Rouge, MD   Subjective   Seems to be doing a lot better today.  He is alert and appears to be fully oriented. Currently in atrial fibrillation with borderline ventricular rate control, heart rate has been oscillating 90s-110s.  Unaware of palpitations. Blood pressure was a little low at the end of dialysis yesterday, but is normal today.  Metoprolol dose increased. Low-grade temperature 100.5 F. He wants to start walking.  Inpatient Medications    Scheduled Meds: . amiodarone  200 mg Oral Daily  . aspirin EC  81 mg Oral Daily  . atorvastatin  40 mg Oral Daily  . calcitRIOL  0.75 mcg Oral Q M,W,F-HD  . calcium carbonate  400 mg of elemental calcium Oral BID BM  . Chlorhexidine Gluconate Cloth  6 each Topical Q0600  . Chlorhexidine Gluconate Cloth  6 each Topical Q0600  . Chlorhexidine Gluconate Cloth  6 each Topical Q0600  . coumadin book   Does not apply Once  . darbepoetin (ARANESP) injection - DIALYSIS  100 mcg Intravenous Q Wed-HD  . docusate sodium  100 mg Oral Daily  . metoprolol succinate  50 mg Oral Daily  . multivitamin  1 tablet Oral QHS  . pantoprazole  40 mg Oral Daily  . sevelamer carbonate  2,400 mg Oral TID WC  . warfarin   Does not apply Once  . Warfarin - Pharmacist Dosing Inpatient   Does not apply q1800   Continuous Infusions: . magnesium sulfate 1 - 4 g bolus IVPB     PRN Meds: acetaminophen **OR** acetaminophen, albuterol, bisacodyl, gabapentin, guaiFENesin-dextromethorphan, labetalol, magnesium sulfate 1 - 4 g bolus IVPB, morphine injection, nitroGLYCERIN, ondansetron, ondansetron, oxyCODONE-acetaminophen, phenol, polyethylene glycol   Vital Signs    Vitals:   04/18/18 2000 04/19/18 0000 04/19/18 0400 04/19/18 0757  BP: 120/77 103/65 110/76 114/67  Pulse: 87 77  (!) 103  Resp: 18 20 (!) 22 19  Temp: (!) 100.5 F (38.1 C) 99.1 F (37.3 C)  98.9 F (37.2 C) 98.3 F (36.8 C)  TempSrc: Axillary Oral Oral Oral  SpO2:    95%  Weight:      Height:        Intake/Output Summary (Last 24 hours) at 04/19/2018 1013 Last data filed at 04/19/2018 0047 Gross per 24 hour  Intake 289.76 ml  Output 1558 ml  Net -1268.24 ml   Filed Weights   04/17/18 0402 04/18/18 0800 04/18/18 1212  Weight: 75 kg 75.7 kg 74.2 kg    Telemetry    Atrial fibrillation with borderline ventricular rate control- Personally Reviewed  ECG    No new tracings- Personally Reviewed  Physical Exam  Lying fully flat in bed looks comfortable GEN: No acute distress.   Neck: No JVD Cardiac:  Irregular, no murmurs, rubs, or gallops.  Respiratory: Clear to auscultation bilaterally. GI: Soft, nontender, non-distended  MS: No edema; No deformity. Neuro:  Nonfocal  Psych: Normal affect   Labs    Chemistry Recent Labs  Lab 04/13/18 0338 04/15/18 0727 04/16/18 0749 04/18/18 0800  NA 137 134* 133* 130*  K 5.4* 5.1 4.9 4.9  CL 96* 94* 92* 94*  CO2 30 28 25  21*  GLUCOSE 99 103* 103* 70  BUN 43* 50* 71* 68*  CREATININE 9.24* 10.41* 12.75* 10.50*  CALCIUM 7.9* 9.0 9.1 8.9  ALBUMIN 2.9*  --  2.5* 2.2*  GFRNONAA 5* 5* 4* 5*  GFRAA 6* 5* 4* 5*  ANIONGAP 11 12 16* 15     Hematology Recent Labs  Lab 04/16/18 0749 04/18/18 0300 04/19/18 0018  WBC 5.4 3.9* 4.0  RBC 2.70* 2.46* 2.30*  HGB 8.6* 7.9* 7.4*  HCT 28.8* 25.1* 23.3*  MCV 106.7* 102.0* 101.3*  MCH 31.9 32.1 32.2  MCHC 29.9* 31.5 31.8  RDW 15.5 14.9 15.3  PLT 156 162 165    Cardiac EnzymesNo results for input(s): TROPONINI in the last 168 hours. No results for input(s): TROPIPOC in the last 168 hours.   BNPNo results for input(s): BNP, PROBNP in the last 168 hours.   DDimer No results for input(s): DDIMER in the last 168 hours.   Radiology    No results found.  Cardiac Studies   Left heart catheterization 03/29/18: Conclusions: 1. Mild, non-obstructive coronary artery  disease. 2. Normal left ventricular systolic function with upper normal left ventricular filling pressure.  Recommendations: 1. Medical therapy and risk factor modification to prevent progression of disease. 2. Ok to proceed with vascular surgery from cardiac standpoint. 3. Hemodialysis tomorrow, as scheduled.  Recommend Aspirin 81mg  daily for peripheral vascular disease.  Echo Dec 06, 2016 - Left ventricle: The cavity size was normal. Wall thickness was increased in a pattern of moderate LVH. Systolic function was normal. The estimated ejection fraction was in the range of 55% to 60%. Wall motion was normal; there were no regional wall motion abnormalities. Doppler parameters are consistent with abnormal left ventricular relaxation (grade 1 diastolic dysfunction). The E/e&' ratio is between 8-15, suggesting indeterminate LV filling pressure. - Aorta: Ascending aortic diameter: 42 mm (S). - Ascending aorta: The ascending aorta was mildly dilated. - Mitral valve: Calcified annulus. There was trivial regurgitation. - Left atrium: Moderately dilated. - Right atrium: The atrium was mildly dilated. - Atrial septum: Mobile IAS - cannot exclude small PFO. - Tricuspid valve: There was mild regurgitation. - Pulmonary arteries: PA peak pressure: 47 mm Hg (S). - Systemic veins: The IVC measures >2.1 cm, but collapses more than 50%, suggesting an elevated RA pressure of 8 mmHg.  Impressions:  - LVEF 55-60%, moderate LVH, normal wall motion, grade 1 DD with indeterminate LV filling pressure, mildly dilated ascending aorta to 4.2 cm, trivial MR, moderate LAE, mild RAE, mobile interatrial septum, mild TR, RVSP 47 mmHg, dilated IVC suggestive of elevated RA pressure of 8 mmHg.  Patient Profile     62 y.o. male with past medical history of PVD, ESRD (HD - MWF), Hepatitis C, HTN, HLD, COPD, chronic anemia and tobacco use, minor coronary atherosclerosis by cath  September 2019, found to have new onset, recurrent episodes of paroxysmal atrial fibrillation.  Assessment & Plan    1. Atrial fibrillation with RVR: Increased metoprolol to 50 mg daily.  I am not sure his blood pressure will tolerate higher doses, will increase the amiodarone.  On coumadin forCHA2DS2-VASc Score2 (HTN, Vascular).  Stop aspirin (INR 2.9 today). 2. HTN: Currently blood pressure on the low end of normal. 3. PVD:s/p left femoropopliteal bypass 04/11/18.  He wants to start walking. 4. ESRD:on HD M/W/F     For questions or updates, please contact [facility] Please consult [email or web address] for contact info under        Signed, Sanda Klein, MD  04/19/2018, 10:13 AM

## 2018-04-19 NOTE — Progress Notes (Signed)
ANTICOAGULATION CONSULT NOTE - Follow Up Consult  Pharmacy Consult for heparin Indication: atrial fibrillation  Labs: Recent Labs    04/16/18 0749  04/17/18 0451 04/18/18 0300 04/18/18 0800 04/18/18 1313 04/18/18 2255  HGB 8.6*  --   --  7.9*  --   --   --   HCT 28.8*  --   --  25.1*  --   --   --   PLT 156  --   --  162  --   --   --   LABPROT  --   --  15.8* 19.6*  --   --   --   INR  --   --  1.27 1.68  --   --   --   HEPARINUNFRC  --    < > 0.27* 0.22*  --  0.29* 0.32  CREATININE 12.75*  --   --   --  10.50*  --   --    < > = values in this interval not displayed.    Assessment/Plan:  62yo male therapeutic on heparin after rate increases. Will continue gtt at current rate and confirm stable with am labs.   Wynona Neat, PharmD, BCPS  04/19/2018,12:05 AM

## 2018-04-19 NOTE — Plan of Care (Signed)
  Problem: Clinical Measurements: Goal: Ability to maintain clinical measurements within normal limits will improve Outcome: Progressing Goal: Postoperative complications will be avoided or minimized Outcome: Progressing   

## 2018-04-19 NOTE — Progress Notes (Signed)
CSW met with patient at bedside for SNF choice. Patient chose [facility]. SNF can transport patient to dialysis. Discussed briefly with PA. CSW to follow for medical readiness and support with discharge.   , LCSWA [phone]  

## 2018-04-19 NOTE — Progress Notes (Addendum)
  Progress Note    04/19/2018 8:00 AM 8 Days Post-Op  Subjective:  No new complaints   Vitals:   04/19/18 0400 04/19/18 0757  BP: 110/76 114/67  Pulse:  (!) 103  Resp: (!) 22 19  Temp: 98.9 F (37.2 C) 98.3 F (36.8 C)  SpO2:  95%   Physical Exam: Lungs:  Non labored Incisions:  L popliteal incision with serous collection on dressing, no palpable seroma or active drainage; L groin c/d/i Extremities:  Palpable L DP Abdomen:  Soft  Neurologic: A&O  CBC    Component Value Date/Time   WBC 4.0 04/19/2018 0018   RBC 2.30 (L) 04/19/2018 0018   HGB 7.4 (L) 04/19/2018 0018   HGB 11.0 (L) 03/27/2018 1420   HCT 23.3 (L) 04/19/2018 0018   HCT 32.7 (L) 03/27/2018 1420   PLT 165 04/19/2018 0018   PLT 174 03/27/2018 1420   MCV 101.3 (H) 04/19/2018 0018   MCV 101 (H) 03/27/2018 1420   MCH 32.2 04/19/2018 0018   MCHC 31.8 04/19/2018 0018   RDW 15.3 04/19/2018 0018   RDW 14.6 03/27/2018 1420   LYMPHSABS 0.4 (L) 03/31/2018 0907   LYMPHSABS 0.9 03/27/2018 1420   MONOABS 0.1 03/31/2018 0907   EOSABS 0.0 03/31/2018 0907   EOSABS 0.1 03/27/2018 1420   BASOSABS 0.0 03/31/2018 0907   BASOSABS 0.0 03/27/2018 1420    BMET    Component Value Date/Time   NA 130 (L) 04/18/2018 0800   NA 144 03/27/2018 1420   K 4.9 04/18/2018 0800   CL 94 (L) 04/18/2018 0800   CO2 21 (L) 04/18/2018 0800   GLUCOSE 70 04/18/2018 0800   BUN 68 (H) 04/18/2018 0800   BUN 57 (H) 03/27/2018 1420   CREATININE 10.50 (H) 04/18/2018 0800   CALCIUM 8.9 04/18/2018 0800   GFRNONAA 5 (L) 04/18/2018 0800   GFRAA 5 (L) 04/18/2018 0800    INR    Component Value Date/Time   INR 2.92 04/19/2018 0018     Intake/Output Summary (Last 24 hours) at 04/19/2018 0800 Last data filed at 04/19/2018 0047 Gross per 24 hour  Intake 289.76 ml  Output 1558 ml  Net -1268.24 ml     Assessment/Plan:  62 y.o. male is s/p  L femoral to popliteal artery bypass graft 8 Days Post-Op   Perfusing LLE well Continue dry  dressing changes L popliteal incision Pharmacy bridging back to coumadin Cardiology managing A fib Nephrology managing ESRD on HD on MWF schedule CSW working on [facility] placement D/c to SNF when approved and cleared medically   Dagoberto Ligas, PA-C [facility] [phone] 04/19/2018 8:00 AM  I have examined the patient, reviewed and agree with above.  Curt Jews, MD 04/19/2018 11:14 AM

## 2018-04-20 LAB — IRON AND TIBC
Iron: 9 ug/dL — ABNORMAL LOW (ref 45–182)
SATURATION RATIOS: 5 % — AB (ref 17.9–39.5)
TIBC: 181 ug/dL — ABNORMAL LOW (ref 250–450)
UIBC: 172 ug/dL

## 2018-04-20 LAB — CBC
HCT: 24.4 % — ABNORMAL LOW (ref 39.0–52.0)
HEMATOCRIT: 24.9 % — AB (ref 39.0–52.0)
HEMOGLOBIN: 7.4 g/dL — AB (ref 13.0–17.0)
Hemoglobin: 7.3 g/dL — ABNORMAL LOW (ref 13.0–17.0)
MCH: 30.3 pg (ref 26.0–34.0)
MCH: 30.7 pg (ref 26.0–34.0)
MCHC: 29.7 g/dL — AB (ref 30.0–36.0)
MCHC: 29.9 g/dL — ABNORMAL LOW (ref 30.0–36.0)
MCV: 102 fL — ABNORMAL HIGH (ref 80.0–100.0)
MCV: 102.5 fL — ABNORMAL HIGH (ref 80.0–100.0)
NRBC: 0 % (ref 0.0–0.2)
PLATELETS: 184 10*3/uL (ref 150–400)
Platelets: 185 10*3/uL (ref 150–400)
RBC: 2.44 MIL/uL — ABNORMAL LOW (ref 4.22–5.81)
RBC: 2.38 MIL/uL — AB (ref 4.22–5.81)
RDW: 15.7 % — ABNORMAL HIGH (ref 11.5–15.5)
RDW: 15.8 % — AB (ref 11.5–15.5)
WBC: 3.6 10*3/uL — AB (ref 4.0–10.5)
WBC: 4.1 10*3/uL (ref 4.0–10.5)
nRBC: 0 % (ref 0.0–0.2)

## 2018-04-20 LAB — RENAL FUNCTION PANEL
ALBUMIN: 2.2 g/dL — AB (ref 3.5–5.0)
ANION GAP: 14 (ref 5–15)
BUN: 56 mg/dL — AB (ref 8–23)
CO2: 26 mmol/L (ref 22–32)
Calcium: 9.4 mg/dL (ref 8.9–10.3)
Chloride: 95 mmol/L — ABNORMAL LOW (ref 98–111)
Creatinine, Ser: 9.51 mg/dL — ABNORMAL HIGH (ref 0.61–1.24)
GFR calc Af Amer: 6 mL/min — ABNORMAL LOW (ref >60)
GFR calc non Af Amer: 5 mL/min — ABNORMAL LOW (ref >60)
GLUCOSE: 105 mg/dL — AB (ref 70–99)
PHOSPHORUS: 3.6 mg/dL (ref 2.5–4.6)
POTASSIUM: 4.2 mmol/L (ref 3.5–5.1)
Sodium: 135 mmol/L (ref 135–145)

## 2018-04-20 LAB — PROTIME-INR
INR: 2.31
Prothrombin Time: 25.2 seconds — ABNORMAL HIGH (ref 11.4–15.2)

## 2018-04-20 LAB — FERRITIN: FERRITIN: 423 ng/mL — AB (ref 24–336)

## 2018-04-20 MED ORDER — SODIUM CHLORIDE 0.9 % IV SOLN
510.0000 mg | Freq: Once | INTRAVENOUS | Status: AC
  Administered 2018-04-20: 510 mg via INTRAVENOUS
  Filled 2018-04-20: qty 17

## 2018-04-20 MED ORDER — HEPARIN SODIUM (PORCINE) 1000 UNIT/ML DIALYSIS
2000.0000 [IU] | INTRAMUSCULAR | Status: DC | PRN
  Administered 2018-04-20: 2000 [IU] via INTRAVENOUS_CENTRAL
  Filled 2018-04-20: qty 2

## 2018-04-20 MED ORDER — OXYCODONE-ACETAMINOPHEN 5-325 MG PO TABS: 1.0000 | ORAL_TABLET | ORAL | 0 refills | Status: DC | PRN

## 2018-04-20 MED ORDER — PENTAFLUOROPROP-TETRAFLUOROETH EX AERO: 1.0000 "application " | INHALATION_SPRAY | CUTANEOUS | Status: DC | PRN

## 2018-04-20 MED ORDER — HEPARIN SODIUM (PORCINE) 1000 UNIT/ML IJ SOLN
INTRAMUSCULAR | Status: AC
  Administered 2018-04-20: 2000 [IU] via INTRAVENOUS_CENTRAL
  Filled 2018-04-20: qty 2

## 2018-04-20 MED ORDER — SODIUM CHLORIDE 0.9 % IV SOLN: 100.0000 mL | INTRAVENOUS | Status: DC | PRN

## 2018-04-20 MED ORDER — WARFARIN SODIUM 2.5 MG PO TABS
2.5000 mg | ORAL_TABLET | Freq: Once | ORAL | Status: AC
  Administered 2018-04-20: 2.5 mg via ORAL
  Filled 2018-04-20 (×2): qty 1

## 2018-04-20 MED ORDER — METOPROLOL SUCCINATE ER 100 MG PO TB24
100.0000 mg | ORAL_TABLET | Freq: Every day | ORAL | Status: DC
  Administered 2018-04-21: 100 mg via ORAL
  Filled 2018-04-20: qty 1

## 2018-04-20 NOTE — Discharge Summary (Signed)
Physician Discharge Summary   Patient ID: Raymond [name] [id number] 62 y.o. 1955-09-04  Admit date: 04/11/2018  Discharge date and time: 04/21/18   Admitting Physician: Raymond Posner, MD  Discharge Physician: Dr. Donzetta [name]  Admission Diagnoses: PERIPHERAL VASCULAR DISEASE WITH CLAUDICATION  Discharge Diagnoses: PAD, ESRD  Admission Condition: poor  Discharged Condition: fair  Indication for Admission: Debilitating claudication left lower extremity  Hospital Course: Raymond [name] is a 62 year old male with end-stage renal disease on hemodialysis who is brought into the hospital as an outpatient for left femoral to popliteal bypass with reversed greater saphenous vein by Dr. early on 04/11/2018.  This surgery was performed due to debilitating left leg claudication.  He was admitted to the hospital postoperatively.  He tolerated the procedure well.  The nephrology service was consulted for management of end-stage renal disease on hemodialysis during his hospital stay.  Nephrology also assisted and patient's fluid status postoperatively.  Throughout the patient's hospital stay he has maintained a left palpable DP pulse.  His right is still physical therapy and Occupational Therapy were asked to evaluate and treat the patient as well postoperatively.  Recommendations were made for skilled nursing facility.  This was by TEE while waiting for approval for skilled nursing facility on postoperative day #4 patient experienced some chest pain and new onset atrial fibrillation with rapid ventricular response.  Cardiology service was consulted.  He was rhythm controlled initially with IV amiodarone and beta-blockade was titrated.  Pharmacy was then consulted by cardiology for transition from IV heparin to Coumadin for long-term use.  Patient was eventually switched to p.o. amiodarone and beta-blockade also adjusted accordingly.  Patient was bridged to a therapeutic INR and IV heparin was discontinued.  He will be  discharged with the recommended dose of amiodarone and beta-blockade per cardiology.  He will also need a prescription for Coumadin and will follow up with cardiology for INR monitoring.  He will also be discharged with 2 to 3 days of narcotic pain medication for continued postoperative pain control over the course of his hospital stay left groin incision is healing well he does have some serous drainage from popliteal incision however this is unremarkable.  He will follow-up with Dr. early in 2 to 3 weeks.  When okay with nephrology and cardiology he will be discharged to skilled nursing facility with the above recommendations in stable condition.  Consults: cardiology and nephrology  Treatments: surgery: Left femoral to below the knee popliteal bypass with reverse greater saphenous vein by Dr. Donnetta [name] on 04/11/2018  Discharge Exam: See progress note 04/21/2018 Vitals:   04/20/18 1350 04/20/18 1452  BP: 94/66 105/84  Pulse: (!) 122 (!) 104  Resp: 20 (!) 21  Temp: 98.8 F (37.1 C) 97.8 F (36.6 C)  SpO2: 95% 97%      Disposition: Skilled nursing facility  - For [employer] use ---  Post-op:  Wound infection: No  Graft infection: No  Transfusion: No  If yes,  units given New Arrhythmia: Yes Ipsilateral amputation: [x ] no, [ ]  Minor, [ ]  BKA, [ ]  AKA Patency judged by: [ ]  Dopper only, [ ]  Palpable graft pulse, [x ] Palpable distal pulse, [ ]  ABI inc. > 0.15, [ ]  Duplex D/C Ambulatory Status: Ambulatory with Assistance  Complications: MI: [x ] No, [ ]  Troponin only, [ ]  EKG or Clinical CHF: No Resp failure: [ x] none, [ ]  Pneumonia, [ ]  Ventilator Chg in renal function: [ x] none, [ ]  Inc.  Cr > 0.5, [ ]  Temp. Dialysis, [ ]  Permanent dialysis Stroke: [x ] None, [ ]  Minor, [ ]  Major Return to OR: No  Reason for return to OR: [ ]  Bleeding, [ ]  Infection, [ ]  Thrombosis, [ ]  Revision  Discharge medications: Statin use:  Yes ASA use:  Yes Plavix use:  No  for medical reason not  indicated Beta blocker use: Yes Coumadin use: Yes  Allergies as of 04/21/2018   No Known Allergies     Medication List    TAKE these medications   acetaminophen 325 MG tablet Commonly known as:  TYLENOL Take 2 tablets (650 mg total) by mouth every 6 (six) hours as needed. What changed:  reasons to take this   albuterol 108 (90 Base) MCG/ACT inhaler Commonly known as:  PROVENTIL HFA;VENTOLIN HFA Inhale 2 puffs into the lungs every 6 (six) hours as needed for wheezing or shortness of breath.   amiodarone 400 MG tablet Commonly known as:  PACERONE Take 1 tablet (400 mg total) by mouth daily. Start taking on:  04/22/2018   amLODipine 5 MG tablet Commonly known as:  NORVASC Take 1 tablet (5 mg total) by mouth daily.   aspirin EC 81 MG tablet Take 81 mg by mouth daily.   atorvastatin 40 MG tablet Commonly known as:  LIPITOR Take 1 tablet (40 mg total) by mouth daily.   calcitRIOL 0.25 MCG capsule Commonly known as:  ROCALTROL Take 3 capsules (0.75 mcg total) by mouth every Monday, Wednesday, and Friday with hemodialysis. Start taking on:  04/23/2018   coumadin book Misc 1 each by Does not apply route once for 1 dose.   Darbepoetin Alfa 100 MCG/0.5ML Sosy injection Commonly known as:  ARANESP Inject 0.5 mLs (100 mcg total) into the vein every Wednesday with hemodialysis. Start taking on:  04/25/2018   gabapentin 300 MG capsule Commonly known as:  NEURONTIN TAKE 1 CAPSULE BY MOUTH AT BEDTIME AS NEEDED FOR SLEEP What changed:    how much to take  how to take this  when to take this  reasons to take this  additional instructions   metoprolol succinate 25 MG 24 hr tablet Commonly known as:  TOPROL-XL Take 2 tablets (50 mg total) by mouth daily. What changed:  Another medication with the same name was added. Make sure you understand how and when to take each.   metoprolol succinate 100 MG 24 hr tablet Commonly known as:  TOPROL-XL Take 1 tablet (100 mg  total) by mouth daily. Take with or immediately following a meal. Start taking on:  04/22/2018 What changed:  You were already taking a medication with the same name, and this prescription was added. Make sure you understand how and when to take each.   multivitamin Tabs tablet Take 1 tablet by mouth at bedtime.   ondansetron 4 MG tablet Commonly known as:  ZOFRAN Take 1 tablet (4 mg total) by mouth every 8 (eight) hours as needed for nausea or vomiting.   oxyCODONE-acetaminophen 5-325 MG tablet Commonly known as:  PERCOCET/ROXICET Take 1 tablet by mouth every 4 (four) hours as needed for moderate pain.   pantoprazole 40 MG tablet Commonly known as:  PROTONIX Take 1 tablet (40 mg total) by mouth daily.   SENSIPAR 30 MG tablet Generic drug:  cinacalcet Take 30 mg by mouth daily.   sevelamer 800 MG tablet Commonly known as:  RENAGEL Take 4,800 mg by mouth 3 (three) times daily with meals.   warfarin 3  MG tablet Commonly known as:  COUMADIN Take 1 tablet (3 mg total) by mouth one time only at 6 PM.   warfarin Misc Commonly known as:  COUMADIN 1 each by Does not apply route once for 1 dose.       Patient Instructions:   Activity: activity as tolerated Diet: regular diet Wound Care: keep wound clean and dry  Follow-up with Dr. Donnetta [name] in 2 weeks.  Signed: Eda Paschal. Raymond Matters, MD [facility] [place] [facility] Office: [phone] Pager: [phone]

## 2018-04-20 NOTE — Progress Notes (Signed)
[facility] Progress Note   Subjective:   On HD no c/o.  accepted at SNF  Objective Vitals:   04/20/18 0513 04/20/18 0515 04/20/18 0830 04/20/18 0916  BP: 113/78  114/75 117/71  Pulse: (!) 112 (!) 102 94 95  Resp: 19 19 (!) 21 (!) 21  Temp: 99.8 F (37.7 C)  99.5 F (37.5 C)   TempSrc: Oral  Oral   SpO2: 98% 98% 96%   Weight:  75.2 kg    Height:       Physical Exam General:NAD, chronically ill appearing male Heart: regular rate, irregular rhythm Lungs:CTAB, BS decreased Abdomen:soft, NTND, no masses Extremities:1+ edema and tenderness on L, no edema on R Dialysis Access: LU AVF +b/t  Neuro: AAOx3  [facility] Weights   04/18/18 0800 04/18/18 1212 04/20/18 0515  Weight: 75.7 kg 74.2 kg 75.2 kg    Intake/Output Summary (Last 24 hours) at 04/20/2018 1111 Last data filed at 04/19/2018 2307 Gross per 24 hour  Intake 165.87 ml  Output -  Net 165.87 ml    Additional Objective Labs: Basic Metabolic Panel: Recent Labs  Lab 04/16/18 0749 04/18/18 0800 04/20/18 0924  NA 133* 130* 135  K 4.9 4.9 4.2  CL 92* 94* 95*  CO2 25 21* 26  GLUCOSE 103* 70 105*  BUN 71* 68* 56*  CREATININE 12.75* 10.50* 9.51*  CALCIUM 9.1 8.9 9.4  PHOS 7.2* 5.4* 3.6   Liver Function Tests: Recent Labs  Lab 04/16/18 0749 04/18/18 0800 04/20/18 0924  ALBUMIN 2.5* 2.2* 2.2*   CBC: Recent Labs  Lab 04/16/18 0749 04/18/18 0300 04/19/18 0018 04/20/18 0346 04/20/18 0924  WBC 5.4 3.9* 4.0 3.6* 4.1  HGB 8.6* 7.9* 7.4* 7.4* 7.3*  HCT 28.8* 25.1* 23.3* 24.9* 24.4*  MCV 106.7* 102.0* 101.3* 102.0* 102.5*  PLT 156 162 165 185 184   CBG: Recent Labs  Lab 04/16/18 1216 04/16/18 1624 04/16/18 2008  GLUCAP 90 94 109*    Lab Results  Component Value Date   INR 2.31 04/20/2018   INR 2.92 04/19/2018   INR 1.68 04/18/2018   Studies/Results: No results found.  Medications: . sodium chloride    . sodium chloride    . magnesium sulfate 1 - 4 g bolus IVPB     .  amiodarone  400 mg Oral Daily  . atorvastatin  40 mg Oral Daily  . calcitRIOL  0.75 mcg Oral Q M,W,F-HD  . calcium carbonate  400 mg of elemental calcium Oral BID BM  . Chlorhexidine Gluconate Cloth  6 each Topical Q0600  . Chlorhexidine Gluconate Cloth  6 each Topical Q0600  . Chlorhexidine Gluconate Cloth  6 each Topical Q0600  . Chlorhexidine Gluconate Cloth  6 each Topical Q0600  . cinacalcet  180 mg Oral Q M,W,F-1800  . coumadin book   Does not apply Once  . darbepoetin (ARANESP) injection - DIALYSIS  100 mcg Intravenous Q Wed-HD  . docusate sodium  100 mg Oral Daily  . feeding supplement (NEPRO CARB STEADY)  237 mL Oral TID AC  . metoprolol succinate  50 mg Oral Daily  . multivitamin  1 tablet Oral QHS  . pantoprazole  40 mg Oral Daily  . sevelamer carbonate  2,400 mg Oral TID WC  . warfarin  2.5 mg Oral ONCE-1800  . warfarin   Does not apply Once  . Warfarin - Pharmacist Dosing Inpatient   Does not apply q1800    Dialysis Orders: [facility] MWF  4h 71.5kg 2/2 bath  P4 Hep 2000 L AVF -Sensipar 180 mg PO TIW -Calcitriol0.75 mcg PO TIW -Mircera 16mcg IV q 2 weeks (last dose 9/27) -Venofer100mg  IV q HD until 10/4  - amlodipine 5 qd/ metoprolol xl 50 qd  Assessment/Plan: 1.PAD s/p L fem-pop bypass per Dr. Donnetta Hutching on 10/2. Per [facility] 2. Chest pain/ New A fib - initially believed to be post op but has reoccurred. Echo showed EF 60-65% no significant valvular issues. On po amio 400mg  and metop xl 50 qd per cards. On coumadin, INR in range 3. ESRD -MWF schedule. HD today.  4. Anemia of CKD-Hgb 7- 8 range. Aranesp 170mcg qWed starting 10/9.  Fe low and Hb low today, will order one dose Feraheme prior to dc (= 510mg ) if we can get it in.  5. Secondary hyperparathyroidism -Ca improved, now corrected 10.3. Phos in goal. Resume normal Ca bath, continue VDRA Last pth 1421, resume sensipar. Continue to follow trends.  6. HTN/volume -UF to dry wt today.  BP's stable, on metop for  afib 7. Nutrition -Renal diet with fluid restrictions.Renavite. Nepro. 8. Dispo - OK for dc from renal standpoint.    Kelly Splinter MD [employer] pager [phone]   04/20/2018, 11:11 AM

## 2018-04-20 NOTE — Progress Notes (Addendum)
  Progress Note    04/20/2018 7:32 AM 9 Days Post-Op  Subjective:  Seen on dialysis unit this morning.  No new complaints   Vitals:   04/20/18 0513 04/20/18 0515  BP: 113/78   Pulse: (!) 112 (!) 102  Resp: 19 19  Temp: 99.8 F (37.7 C)   SpO2: 98% 98%   Physical Exam: Lungs:  No labored Incisions:  Serous collection dressing of popliteal incision; dressing left in place Extremities:  Palpable L DP pulse Abdomen:  soft Neurologic: A&O  CBC    Component Value Date/Time   WBC 3.6 (L) 04/20/2018 0346   RBC 2.44 (L) 04/20/2018 0346   HGB 7.4 (L) 04/20/2018 0346   HGB 11.0 (L) 03/27/2018 1420   HCT 24.9 (L) 04/20/2018 0346   HCT 32.7 (L) 03/27/2018 1420   PLT 185 04/20/2018 0346   PLT 174 03/27/2018 1420   MCV 102.0 (H) 04/20/2018 0346   MCV 101 (H) 03/27/2018 1420   MCH 30.3 04/20/2018 0346   MCHC 29.7 (L) 04/20/2018 0346   RDW 15.7 (H) 04/20/2018 0346   RDW 14.6 03/27/2018 1420   LYMPHSABS 0.4 (L) 03/31/2018 0907   LYMPHSABS 0.9 03/27/2018 1420   MONOABS 0.1 03/31/2018 0907   EOSABS 0.0 03/31/2018 0907   EOSABS 0.1 03/27/2018 1420   BASOSABS 0.0 03/31/2018 0907   BASOSABS 0.0 03/27/2018 1420    BMET    Component Value Date/Time   NA 130 (L) 04/18/2018 0800   NA 144 03/27/2018 1420   K 4.9 04/18/2018 0800   CL 94 (L) 04/18/2018 0800   CO2 21 (L) 04/18/2018 0800   GLUCOSE 70 04/18/2018 0800   BUN 68 (H) 04/18/2018 0800   BUN 57 (H) 03/27/2018 1420   CREATININE 10.50 (H) 04/18/2018 0800   CALCIUM 8.9 04/18/2018 0800   GFRNONAA 5 (L) 04/18/2018 0800   GFRAA 5 (L) 04/18/2018 0800    INR    Component Value Date/Time   INR 2.31 04/20/2018 0346     Intake/Output Summary (Last 24 hours) at 04/20/2018 0732 Last data filed at 04/19/2018 2307 Gross per 24 hour  Intake 165.87 ml  Output -  Net 165.87 ml     Assessment/Plan:  62 y.o. male is s/p L femoral to popliteal artery bypass graft 9 Days Post-Op   Palpable L DP Dry dressing changed L  popliteal incision; minimal serous drainage INR therapeutic ESRD on HD per Nephrology SNF approved by CSW; plan to d/c after dialysis if ok with Nephrology and Cardiology   Dagoberto Ligas, PA-C [facility] [phone] 04/20/2018 7:32 AM  I have examined the patient, reviewed and agree with above.  Curt Jews, MD 04/20/2018 8:06 AM

## 2018-04-20 NOTE — Progress Notes (Signed)
Clinical Social Worker following patient for support and discharge needs. Patient at this time has a bed at [facility] and facility awaiting tranfers. Facility stated they are able to take patient over the weekend.   Rhea Pink, MSW,  [place]

## 2018-04-20 NOTE — Progress Notes (Signed)
Raymond [name] for warfarin and heparin IV Indication: atrial fibrillation  No Known Allergies  Patient Measurements: Height: 5\' 8"  (172.7 cm) Weight: 165 lb 11.2 oz (75.2 kg) IBW/kg (Calculated) : 68.4  Heparin dosing weight 74.5kg    Vital Signs: Temp: 99.8 F (37.7 C) (10/11 0513) Temp Source: Oral (10/11 0513) BP: 113/78 (10/11 0513) Pulse Rate: 102 (10/11 0515)  Labs: Recent Labs    04/18/18 0300 04/18/18 0800 04/18/18 1313 04/18/18 2255 04/19/18 0018 04/19/18 0654 04/20/18 0346  HGB 7.9*  --   --   --  7.4*  --  7.4*  HCT 25.1*  --   --   --  23.3*  --  24.9*  PLT 162  --   --   --  165  --  185  LABPROT 19.6*  --   --   --  30.3*  --  25.2*  INR 1.68  --   --   --  2.92  --  2.31  HEPARINUNFRC 0.22*  --  0.29* 0.32  --  0.34  --   CREATININE  --  10.50*  --   --   --   --   --     Estimated Creatinine Clearance: 6.9 mL/min (A) (by C-G formula based on SCr of 10.5 mg/dL (H)).   Medical History: Past Medical History:  Diagnosis Date  . Anemia   . CAD (coronary artery disease)    a. 03/2018: cath showing 20 to 30% stenosis along the LAD, luminal irregularities along the RCA, and angiographically normal LCx  . COPD (chronic obstructive pulmonary disease) ([place])   . Dyspnea    "with too much fluid"  . ESRD (end stage renal disease) on dialysis [facility])    "MWF; [facility]" (07/22/17)  . Hemodialysis patient ([place])   . Hepatitis C    Still positive s/p liver biopsy at [facility]  and interferon therapy for 6 months. Most recent lab work was on 10/24/12  . Hepatitis C    "took the tx; gone now" (12/05/2016)  Was treated  . History of blood transfusion ~ 2012/2013   "related to my kidneys; blood was low"  . Hypertension   . Substance abuse ([place])   . Thyroid disease     Medications:  Scheduled:  . amiodarone  400 mg Oral Daily  . atorvastatin  40 mg Oral Daily  . calcitRIOL  0.75 mcg Oral Q M,W,F-HD  . calcium carbonate  400  mg of elemental calcium Oral BID BM  . Chlorhexidine Gluconate Cloth  6 each Topical Q0600  . Chlorhexidine Gluconate Cloth  6 each Topical Q0600  . Chlorhexidine Gluconate Cloth  6 each Topical Q0600  . Chlorhexidine Gluconate Cloth  6 each Topical Q0600  . cinacalcet  180 mg Oral Q M,W,F-1800  . coumadin book   Does not apply Once  . darbepoetin (ARANESP) injection - DIALYSIS  100 mcg Intravenous Q Wed-HD  . docusate sodium  100 mg Oral Daily  . feeding supplement (NEPRO CARB STEADY)  237 mL Oral TID AC  . heparin      . metoprolol succinate  50 mg Oral Daily  . multivitamin  1 tablet Oral QHS  . pantoprazole  40 mg Oral Daily  . sevelamer carbonate  2,400 mg Oral TID WC  . warfarin   Does not apply Once  . Warfarin - Pharmacist Dosing Inpatient   Does not apply q1800    Assessment: 62 yo male  with ESRD and new diagnosis of atrial fibrillation.  Patient is now to be started on anticoagulation with heparin and warfarin per pharmacy. Patient started on amiodarone 200mg  daily, which may affect the INR. Baseline INR 1.16 on 10/3.   Heparin level remains therapeutic at 2.31. Hg down a bit to 7.4 (watch closely), plt stable. No bleed documented.  Goal of Therapy:  INR 2-3 Monitor platelets by anticoagulation protocol: Yes   Plan:  Warfarin 2.5mg  tonight - likely needs lower doses on amiodarone Monitor daily INR, CBC, s/sx bleeding, DDI with amio  Mikaila Grunert A. Levada Dy, PharmD, [place] Pager: [phone] Please utilize Amion for appropriate phone number to reach the unit pharmacist ([place])   04/20/2018 9:46 AM

## 2018-04-20 NOTE — Care Management Important Message (Signed)
Important Message  Patient Details  Name: Raymond [name] MRN: [id number] Date of Birth: Aug 17, 1955   Medicare Important Message Given:  Yes    Wah Sabic P Frank Novelo 04/20/2018, 3:08 PM

## 2018-04-20 NOTE — Progress Notes (Signed)
PT Cancellation Note  Patient Details Name: Raymond [name] MRN: [id number] DOB: 04-30-1956   Cancelled Treatment:    Reason Eval/Treat Not Completed: Patient declined, no reason specified. Patient refusing out of bed mobility secondary to "feeling like he just got in a fight," and difficulty breathing (SpO2 100% on RA) despite max encouragement for limited mobility. Will follow.  Ellamae Sia, PT, DPT Acute Rehabilitation Services Pager [phone] Office [phone]    Willy Eddy 04/20/2018, 3:20 PM

## 2018-04-20 NOTE — Progress Notes (Signed)
Patient's temperature reported by NT to be elevated, 101.8 F. Patient had temperature of 102.2 F reported by RN at 1743 hrs and patient received acetaminophen for same. Assessed patient for signs and symptoms of possible infection at surgical incision sites, no redness or purulent discharge found. Left leg distal has scant amount of non-purulent, serous drainage. No other signs or symptoms of possible infection found. Will continue to monitor surgical sites and temperature.

## 2018-04-20 NOTE — Progress Notes (Signed)
Patient's heart rate went up to 140's throughout the night but did not sustain. Will continue to monitor

## 2018-04-20 NOTE — Progress Notes (Signed)
Progress Note  Patient Name: Raymond [name] Date of Encounter: 04/20/2018  Primary Cardiologist: Jenkins Rouge, MD   Subjective   Seen on HD. Feels "OK". In and out of AF w RVR (sinus 80s, AF 110-120s). BP a little low towards end of HD, but pulled > 3L.  Inpatient Medications    Scheduled Meds: . amiodarone  400 mg Oral Daily  . atorvastatin  40 mg Oral Daily  . calcitRIOL  0.75 mcg Oral Q M,W,F-HD  . calcium carbonate  400 mg of elemental calcium Oral BID BM  . Chlorhexidine Gluconate Cloth  6 each Topical Q0600  . Chlorhexidine Gluconate Cloth  6 each Topical Q0600  . Chlorhexidine Gluconate Cloth  6 each Topical Q0600  . Chlorhexidine Gluconate Cloth  6 each Topical Q0600  . cinacalcet  180 mg Oral Q M,W,F-1800  . coumadin book   Does not apply Once  . darbepoetin (ARANESP) injection - DIALYSIS  100 mcg Intravenous Q Wed-HD  . docusate sodium  100 mg Oral Daily  . feeding supplement (NEPRO CARB STEADY)  237 mL Oral TID AC  . [START ON 04/21/2018] metoprolol succinate  100 mg Oral Daily  . multivitamin  1 tablet Oral QHS  . pantoprazole  40 mg Oral Daily  . sevelamer carbonate  2,400 mg Oral TID WC  . warfarin  2.5 mg Oral ONCE-1800  . warfarin   Does not apply Once  . Warfarin - Pharmacist Dosing Inpatient   Does not apply q1800   Continuous Infusions: . sodium chloride    . sodium chloride    . magnesium sulfate 1 - 4 g bolus IVPB     PRN Meds: sodium chloride, sodium chloride, acetaminophen **OR** acetaminophen, albuterol, bisacodyl, gabapentin, guaiFENesin-dextromethorphan, [START ON 04/21/2018] heparin, magnesium sulfate 1 - 4 g bolus IVPB, morphine injection, nitroGLYCERIN, ondansetron, ondansetron, oxyCODONE-acetaminophen, pentafluoroprop-tetrafluoroeth, phenol, polyethylene glycol   Vital Signs    Vitals:   04/20/18 0930 04/20/18 1000 04/20/18 1030 04/20/18 1100  BP: 114/78 133/85 97/66 102/64  Pulse: 77 78 91 90  Resp: 20 18 18 18   Temp:        TempSrc:      SpO2:      Weight:      Height:        Intake/Output Summary (Last 24 hours) at 04/20/2018 1334 Last data filed at 04/19/2018 2307 Gross per 24 hour  Intake 0 ml  Output -  Net 0 ml   Filed Weights   04/18/18 0800 04/18/18 1212 04/20/18 0515  Weight: 75.7 kg 74.2 kg 75.2 kg    Telemetry    Intermittent PAF max 120s - Personally Reviewed  ECG    No new tracing - Personally Reviewed  Physical Exam  Lying horizontally w/o dyspnea. GEN: No acute distress.   Neck: No JVD Cardiac: irregular, no murmurs, rubs, or gallops.  Respiratory: Clear to auscultation bilaterally. GI: Soft, nontender, non-distended  MS: No edema; No deformity. Neuro:  Nonfocal  Psych: Normal affect   Labs    Chemistry Recent Labs  Lab 04/16/18 0749 04/18/18 0800 04/20/18 0924  NA 133* 130* 135  K 4.9 4.9 4.2  CL 92* 94* 95*  CO2 25 21* 26  GLUCOSE 103* 70 105*  BUN 71* 68* 56*  CREATININE 12.75* 10.50* 9.51*  CALCIUM 9.1 8.9 9.4  ALBUMIN 2.5* 2.2* 2.2*  GFRNONAA 4* 5* 5*  GFRAA 4* 5* 6*  ANIONGAP 16* 15 14     Hematology Recent Labs  Lab 04/19/18 0018 04/20/18 0346 04/20/18 0924  WBC 4.0 3.6* 4.1  RBC 2.30* 2.44* 2.38*  HGB 7.4* 7.4* 7.3*  HCT 23.3* 24.9* 24.4*  MCV 101.3* 102.0* 102.5*  MCH 32.2 30.3 30.7  MCHC 31.8 29.7* 29.9*  RDW 15.3 15.7* 15.8*  PLT 165 185 184    Cardiac EnzymesNo results for input(s): TROPONINI in the last 168 hours. No results for input(s): TROPIPOC in the last 168 hours.   BNPNo results for input(s): BNP, PROBNP in the last 168 hours.   DDimer No results for input(s): DDIMER in the last 168 hours.   Radiology    No results found.  Cardiac Studies   Left heart catheterization 03/29/18: Conclusions: 1. Mild, non-obstructive coronary artery disease. 2. Normal left ventricular systolic function with upper normal left ventricular filling pressure.  Recommendations: 1. Medical therapy and risk factor modification to  prevent progression of disease. 2. Ok to proceed with vascular surgery from cardiac standpoint. 3. Hemodialysis tomorrow, as scheduled.  Recommend Aspirin 81mg  daily for peripheral vascular disease.  Echo Dec 06, 2016 - Left ventricle: The cavity size was normal. Wall thickness was increased in a pattern of moderate LVH. Systolic function was normal. The estimated ejection fraction was in the range of 55% to 60%. Wall motion was normal; there were no regional wall motion abnormalities. Doppler parameters are consistent with abnormal left ventricular relaxation (grade 1 diastolic dysfunction). The E/e&' ratio is between 8-15, suggesting indeterminate LV filling pressure. - Aorta: Ascending aortic diameter: 42 mm (S). - Ascending aorta: The ascending aorta was mildly dilated. - Mitral valve: Calcified annulus. There was trivial regurgitation. - Left atrium: Moderately dilated. - Right atrium: The atrium was mildly dilated. - Atrial septum: Mobile IAS - cannot exclude small PFO. - Tricuspid valve: There was mild regurgitation. - Pulmonary arteries: PA peak pressure: 47 mm Hg (S). - Systemic veins: The IVC measures >2.1 cm, but collapses more than 50%, suggesting an elevated RA pressure of 8 mmHg.  Impressions:  - LVEF 55-60%, moderate LVH, normal wall motion, grade 1 DD with indeterminate LV filling pressure, mildly dilated ascending aorta to 4.2 cm, trivial MR, moderate LAE, mild RAE, mobile interatrial septum, mild TR, RVSP 47 mmHg, dilated IVC suggestive of elevated RA pressure of 8 mmHg.  Patient Profile     62 y.o. male with past medical history of PVD, ESRD (HD - MWF), Hepatitis C, HTN, HLD, COPD, chronic anemia and tobacco use, minor coronary atherosclerosis by cath September 2019, found to have new onset,recurrent episodes of paroxysmalatrial fibrillation.  Assessment & Plan    1. Atrial fibrillation with VOJ:[id number] metoprolol further  to 100 mg daily.  As amiodarone "kicks in" will likely need to reduce the metoprolol dose.  Oncoumadin forCHA2DS2-VASc Score2 (HTN, Vascular). (INR 2.9 today). 2. XHB:ZJIRCVELF blood pressure on the low end of normal after HD and >3L fluid removal. 3. PVD:s/p left femoropopliteal bypass 04/11/18.  4. ESRD:on HD M/W/F. Anemia is moderate to severe, Fe levels low.         For questions or updates, please contact [place] Please consult [email or web address] for contact info under        Signed, Sanda Klein, MD  04/20/2018, 1:34 PM

## 2018-04-21 DIAGNOSIS — G629 Polyneuropathy, unspecified: Secondary | ICD-10-CM | POA: Diagnosis not present

## 2018-04-21 DIAGNOSIS — J441 Chronic obstructive pulmonary disease with (acute) exacerbation: Secondary | ICD-10-CM | POA: Diagnosis not present

## 2018-04-21 DIAGNOSIS — R5381 Other malaise: Secondary | ICD-10-CM | POA: Diagnosis not present

## 2018-04-21 DIAGNOSIS — I739 Peripheral vascular disease, unspecified: Secondary | ICD-10-CM | POA: Diagnosis not present

## 2018-04-21 DIAGNOSIS — I4819 Other persistent atrial fibrillation: Secondary | ICD-10-CM | POA: Diagnosis not present

## 2018-04-21 DIAGNOSIS — K219 Gastro-esophageal reflux disease without esophagitis: Secondary | ICD-10-CM | POA: Diagnosis not present

## 2018-04-21 DIAGNOSIS — I120 Hypertensive chronic kidney disease with stage 5 chronic kidney disease or end stage renal disease: Secondary | ICD-10-CM | POA: Diagnosis not present

## 2018-04-21 DIAGNOSIS — N2581 Secondary hyperparathyroidism of renal origin: Secondary | ICD-10-CM | POA: Diagnosis not present

## 2018-04-21 DIAGNOSIS — I952 Hypotension due to drugs: Secondary | ICD-10-CM | POA: Diagnosis not present

## 2018-04-21 DIAGNOSIS — I70219 Atherosclerosis of native arteries of extremities with intermittent claudication, unspecified extremity: Secondary | ICD-10-CM | POA: Diagnosis not present

## 2018-04-21 DIAGNOSIS — Z7401 Bed confinement status: Secondary | ICD-10-CM | POA: Diagnosis not present

## 2018-04-21 DIAGNOSIS — N2889 Other specified disorders of kidney and ureter: Secondary | ICD-10-CM | POA: Diagnosis not present

## 2018-04-21 DIAGNOSIS — Z720 Tobacco use: Secondary | ICD-10-CM | POA: Diagnosis not present

## 2018-04-21 DIAGNOSIS — E875 Hyperkalemia: Secondary | ICD-10-CM | POA: Diagnosis not present

## 2018-04-21 DIAGNOSIS — N186 End stage renal disease: Secondary | ICD-10-CM | POA: Diagnosis not present

## 2018-04-21 DIAGNOSIS — I2510 Atherosclerotic heart disease of native coronary artery without angina pectoris: Secondary | ICD-10-CM | POA: Diagnosis not present

## 2018-04-21 DIAGNOSIS — D631 Anemia in chronic kidney disease: Secondary | ICD-10-CM | POA: Diagnosis not present

## 2018-04-21 DIAGNOSIS — D509 Iron deficiency anemia, unspecified: Secondary | ICD-10-CM | POA: Diagnosis not present

## 2018-04-21 DIAGNOSIS — I10 Essential (primary) hypertension: Secondary | ICD-10-CM | POA: Diagnosis not present

## 2018-04-21 DIAGNOSIS — Z992 Dependence on renal dialysis: Secondary | ICD-10-CM | POA: Diagnosis not present

## 2018-04-21 DIAGNOSIS — I4891 Unspecified atrial fibrillation: Secondary | ICD-10-CM | POA: Diagnosis not present

## 2018-04-21 DIAGNOSIS — I209 Angina pectoris, unspecified: Secondary | ICD-10-CM | POA: Diagnosis not present

## 2018-04-21 DIAGNOSIS — M2550 Pain in unspecified joint: Secondary | ICD-10-CM | POA: Diagnosis not present

## 2018-04-21 DIAGNOSIS — Z95828 Presence of other vascular implants and grafts: Secondary | ICD-10-CM | POA: Diagnosis not present

## 2018-04-21 DIAGNOSIS — I70412 Atherosclerosis of autologous vein bypass graft(s) of the extremities with intermittent claudication, left leg: Secondary | ICD-10-CM | POA: Diagnosis not present

## 2018-04-21 DIAGNOSIS — Z7901 Long term (current) use of anticoagulants: Secondary | ICD-10-CM | POA: Diagnosis not present

## 2018-04-21 DIAGNOSIS — I7389 Other specified peripheral vascular diseases: Secondary | ICD-10-CM | POA: Diagnosis not present

## 2018-04-21 DIAGNOSIS — E782 Mixed hyperlipidemia: Secondary | ICD-10-CM | POA: Diagnosis not present

## 2018-04-21 DIAGNOSIS — I480 Paroxysmal atrial fibrillation: Secondary | ICD-10-CM | POA: Diagnosis not present

## 2018-04-21 DIAGNOSIS — L859 Epidermal thickening, unspecified: Secondary | ICD-10-CM | POA: Diagnosis not present

## 2018-04-21 LAB — PROTIME-INR
INR: 2.21
PROTHROMBIN TIME: 24.3 s — AB (ref 11.4–15.2)

## 2018-04-21 MED ORDER — COUMADIN BOOK: 1.0000 | Freq: Once | 0 refills | Status: AC

## 2018-04-21 MED ORDER — AMIODARONE HCL 400 MG PO TABS: 400.0000 mg | ORAL_TABLET | Freq: Every day | ORAL | 3 refills | Status: DC

## 2018-04-21 MED ORDER — METOPROLOL SUCCINATE ER 100 MG PO TB24: 50.0000 mg | ORAL_TABLET | Freq: Every day | ORAL | 3 refills | Status: DC

## 2018-04-21 MED ORDER — WARFARIN VIDEO: 1.0000 | Freq: Once | 0 refills | Status: AC

## 2018-04-21 MED ORDER — RENA-VITE PO TABS: 1.0000 | ORAL_TABLET | Freq: Every day | ORAL | 3 refills | Status: DC

## 2018-04-21 MED ORDER — WARFARIN SODIUM 3 MG PO TABS: 3.0000 mg | ORAL_TABLET | Freq: Once | ORAL | 3 refills | Status: DC

## 2018-04-21 MED ORDER — DARBEPOETIN ALFA 100 MCG/0.5ML IJ SOSY: 100.0000 ug | PREFILLED_SYRINGE | INTRAMUSCULAR | 3 refills | Status: DC

## 2018-04-21 MED ORDER — WARFARIN SODIUM 3 MG PO TABS: 3.0000 mg | ORAL_TABLET | Freq: Once | ORAL | Status: DC

## 2018-04-21 MED ORDER — CALCITRIOL 0.25 MCG PO CAPS: 0.7500 ug | ORAL_CAPSULE | ORAL | 3 refills | Status: DC

## 2018-04-21 NOTE — Progress Notes (Signed)
ANTICOAGULATION CONSULT NOTE  Pharmacy Consult for warfarin  Indication: atrial fibrillation  No Known Allergies  Patient Measurements: Height: 5\' 8"  (172.7 cm) Weight: 159 lb 2.8 oz (72.2 kg) IBW/kg (Calculated) : 68.4  Heparin dosing weight 74.5kg  Vital Signs: Temp: 98 F (36.7 C) (10/12 1248) Temp Source: Oral (10/12 1248) BP: 93/66 (10/12 1248) Pulse Rate: 85 (10/12 1248)  Labs: Recent Labs    04/18/18 1313 04/18/18 2255  04/19/18 0018 04/19/18 0654 04/20/18 0346 04/20/18 0924 04/21/18 0329  HGB  --   --    < > 7.4*  --  7.4* 7.3*  --   HCT  --   --   --  23.3*  --  24.9* 24.4*  --   PLT  --   --   --  165  --  185 184  --   LABPROT  --   --   --  30.3*  --  25.2*  --  24.3*  INR  --   --   --  2.92  --  2.31  --  2.21  HEPARINUNFRC 0.29* 0.32  --   --  0.34  --   --   --   CREATININE  --   --   --   --   --   --  9.51*  --    < > = values in this interval not displayed.    Estimated Creatinine Clearance: 7.6 mL/min (A) (by C-G formula based on SCr of 9.51 mg/dL (H)).   Medical History: Past Medical History:  Diagnosis Date  . Anemia   . CAD (coronary artery disease)    a. 03/2018: cath showing 20 to 30% stenosis along the LAD, luminal irregularities along the RCA, and angiographically normal LCx  . COPD (chronic obstructive pulmonary disease) ([place])   . Dyspnea    "with too much fluid"  . ESRD (end stage renal disease) on dialysis [facility])    "MWF; [facility]" (07/22/17)  . Hemodialysis patient ([place])   . Hepatitis C    Still positive s/p liver biopsy at [facility]  and interferon therapy for 6 months. Most recent lab work was on 10/24/12  . Hepatitis C    "took the tx; gone now" (12/05/2016)  Was treated  . History of blood transfusion ~ 2012/2013   "related to my kidneys; blood was low"  . Hypertension   . Substance abuse ([place])   . Thyroid disease     Medications:  Scheduled:  . amiodarone  400 mg Oral Daily  . atorvastatin  40 mg Oral Daily  .  calcitRIOL  0.75 mcg Oral Q M,W,F-HD  . calcium carbonate  400 mg of elemental calcium Oral BID BM  . Chlorhexidine Gluconate Cloth  6 each Topical Q0600  . Chlorhexidine Gluconate Cloth  6 each Topical Q0600  . Chlorhexidine Gluconate Cloth  6 each Topical Q0600  . Chlorhexidine Gluconate Cloth  6 each Topical Q0600  . cinacalcet  180 mg Oral Q M,W,F-1800  . coumadin book   Does not apply Once  . darbepoetin (ARANESP) injection - DIALYSIS  100 mcg Intravenous Q Wed-HD  . docusate sodium  100 mg Oral Daily  . feeding supplement (NEPRO CARB STEADY)  237 mL Oral TID AC  . metoprolol succinate  100 mg Oral Daily  . multivitamin  1 tablet Oral QHS  . pantoprazole  40 mg Oral Daily  . sevelamer carbonate  2,400 mg Oral TID WC  . warfarin  Does not apply Once  . Warfarin - Pharmacist Dosing Inpatient   Does not apply q1800    Assessment: 62 yo male with ESRD and new diagnosis of atrial fibrillation.  Patient is now to be started on anticoagulation with heparin and warfarin per pharmacy. Patient started on amiodarone 200mg  daily, which may affect the INR. Baseline INR 1.16 on 10/3.   INR therapeutic at 2.21. Hg low/stable at 7.3 (watch closely), plt stable. No overt bruising or bleeding reported.  Goal of Therapy:  INR 2-3 Monitor platelets by anticoagulation protocol: Yes   Plan:  Warfarin 3mg  tonight  Monitor daily INR, CBC, s/sx bleeding, DDI with amio  Georga Bora, PharmD Clinical Pharmacist 04/21/2018 12:54 PM Please check AMION for all [place] numbers

## 2018-04-21 NOTE — Progress Notes (Signed)
  Progress Note    04/21/2018 10:03 AM 10 Days Post-Op  Subjective: Had a fever yesterday, feeling better today  Vitals:   04/21/18 0752 04/21/18 0809  BP: 103/71 101/72  Pulse: 89 91  Resp: 15 16  Temp: 98 F (36.7 C) 99 F (37.2 C)  SpO2: 95% 96%    Physical Exam: Awake alert and oriented Mild edema left lower extremity Incisions are all clean dry and intact Palpable dorsalis pedis pulse on the left  CBC    Component Value Date/Time   WBC 4.1 04/20/2018 0924   RBC 2.38 (L) 04/20/2018 0924   HGB 7.3 (L) 04/20/2018 0924   HGB 11.0 (L) 03/27/2018 1420   HCT 24.4 (L) 04/20/2018 0924   HCT 32.7 (L) 03/27/2018 1420   PLT 184 04/20/2018 0924   PLT 174 03/27/2018 1420   MCV 102.5 (H) 04/20/2018 0924   MCV 101 (H) 03/27/2018 1420   MCH 30.7 04/20/2018 0924   MCHC 29.9 (L) 04/20/2018 0924   RDW 15.8 (H) 04/20/2018 0924   RDW 14.6 03/27/2018 1420   LYMPHSABS 0.4 (L) 03/31/2018 0907   LYMPHSABS 0.9 03/27/2018 1420   MONOABS 0.1 03/31/2018 0907   EOSABS 0.0 03/31/2018 0907   EOSABS 0.1 03/27/2018 1420   BASOSABS 0.0 03/31/2018 0907   BASOSABS 0.0 03/27/2018 1420    BMET    Component Value Date/Time   NA 135 04/20/2018 0924   NA 144 03/27/2018 1420   K 4.2 04/20/2018 0924   CL 95 (L) 04/20/2018 0924   CO2 26 04/20/2018 0924   GLUCOSE 105 (H) 04/20/2018 0924   BUN 56 (H) 04/20/2018 0924   BUN 57 (H) 03/27/2018 1420   CREATININE 9.51 (H) 04/20/2018 0924   CALCIUM 9.4 04/20/2018 0924   GFRNONAA 5 (L) 04/20/2018 0924   GFRAA 6 (L) 04/20/2018 0924    INR    Component Value Date/Time   INR 2.21 04/21/2018 0329     Intake/Output Summary (Last 24 hours) at 04/21/2018 1003 Last data filed at 04/20/2018 2100 Gross per 24 hour  Intake 360 ml  Output 3493 ml  Net -3133 ml     Assessment:  62 y.o. male is s/p left femoropopliteal bypass with vein for lifestyle limiting claudication  Plan: INR therapeutic with Coumadin No signs of infection left lower  extremity and fever has resolved Disposition will be SNF when bed available   Abbeygail Igoe C. Donzetta Matters, MD [facility] Office: [phone] Pager: [phone]  04/21/2018 10:03 AM

## 2018-04-21 NOTE — Progress Notes (Signed)
Patient's temperature has continued to trend down. Last temperature collected at 0008 hrs 04/21/18 was 99.3 F.

## 2018-04-21 NOTE — Progress Notes (Signed)
3 attempts made to call and give report to [facility]. Rn number left with Network engineer. Raymond [name]

## 2018-04-21 NOTE — Progress Notes (Signed)
[facility] Progress Note   Subjective:   On HD no c/o.  accepted at [facility].  Cardiology signed off, cont metoprolol and amio po.   Objective Vitals:   04/21/18 0430 04/21/18 0752 04/21/18 0809 04/21/18 1248  BP:  103/71 101/72 93/66  Pulse: 89 89 91 85  Resp: 18 15 16 20   Temp:  98 F (36.7 C) 99 F (37.2 C) 98 F (36.7 C)  TempSrc:  Oral Oral Oral  SpO2: 96% 95% 96% 99%  Weight: 72.2 kg     Height:       Physical Exam General:NAD, chronically ill appearing male Heart: regular rate, irregular rhythm Lungs:CTAB, BS decreased Abdomen:soft, NTND, no masses Extremities:1+ edema and tenderness on L, no edema on R Dialysis Access: LU AVF +b/t  Neuro: AAOx3  Filed Weights   04/20/18 0515 04/20/18 1350 04/21/18 0430  Weight: 75.2 kg 71.7 kg 72.2 kg    Intake/Output Summary (Last 24 hours) at 04/21/2018 1338 Last data filed at 04/20/2018 2100 Gross per 24 hour  Intake 120 ml  Output -  Net 120 ml    Additional Objective Labs: Basic Metabolic Panel: Recent Labs  Lab 04/16/18 0749 04/18/18 0800 04/20/18 0924  NA 133* 130* 135  K 4.9 4.9 4.2  CL 92* 94* 95*  CO2 25 21* 26  GLUCOSE 103* 70 105*  BUN 71* 68* 56*  CREATININE 12.75* 10.50* 9.51*  CALCIUM 9.1 8.9 9.4  PHOS 7.2* 5.4* 3.6   Liver Function Tests: Recent Labs  Lab 04/16/18 0749 04/18/18 0800 04/20/18 0924  ALBUMIN 2.5* 2.2* 2.2*   CBC: Recent Labs  Lab 04/16/18 0749 04/18/18 0300 04/19/18 0018 04/20/18 0346 04/20/18 0924  WBC 5.4 3.9* 4.0 3.6* 4.1  HGB 8.6* 7.9* 7.4* 7.4* 7.3*  HCT 28.8* 25.1* 23.3* 24.9* 24.4*  MCV 106.7* 102.0* 101.3* 102.0* 102.5*  PLT 156 162 165 185 184   CBG: Recent Labs  Lab 04/16/18 1216 04/16/18 1624 04/16/18 2008  GLUCAP 90 94 109*    Lab Results  Component Value Date   INR 2.21 04/21/2018   INR 2.31 04/20/2018   INR 2.92 04/19/2018   Studies/Results: No results found.  Medications: . magnesium sulfate 1 - 4 g bolus IVPB     .  amiodarone  400 mg Oral Daily  . atorvastatin  40 mg Oral Daily  . calcitRIOL  0.75 mcg Oral Q M,W,F-HD  . calcium carbonate  400 mg of elemental calcium Oral BID BM  . Chlorhexidine Gluconate Cloth  6 each Topical Q0600  . Chlorhexidine Gluconate Cloth  6 each Topical Q0600  . Chlorhexidine Gluconate Cloth  6 each Topical Q0600  . Chlorhexidine Gluconate Cloth  6 each Topical Q0600  . cinacalcet  180 mg Oral Q M,W,F-1800  . coumadin book   Does not apply Once  . darbepoetin (ARANESP) injection - DIALYSIS  100 mcg Intravenous Q Wed-HD  . docusate sodium  100 mg Oral Daily  . feeding supplement (NEPRO CARB STEADY)  237 mL Oral TID AC  . metoprolol succinate  100 mg Oral Daily  . multivitamin  1 tablet Oral QHS  . pantoprazole  40 mg Oral Daily  . sevelamer carbonate  2,400 mg Oral TID WC  . warfarin  3 mg Oral ONCE-1800  . warfarin   Does not apply Once  . Warfarin - Pharmacist Dosing Inpatient   Does not apply q1800    Dialysis Orders: [place] MWF  4h 71.5kg 2/2 bath P4 Hep 2000  L AVF -Sensipar 180 mg PO TIW -Calcitriol0.75 mcg PO TIW -Mircera 118mcg IV q 2 weeks (last dose 9/27) -Venofer100mg  IV q HD until 10/4  - amlodipine 5 qd/ metoprolol xl 50 qd  Assessment/Plan: 1.PAD s/p L fem-pop bypass per Dr. Donnetta Hutching on 10/2. Per [facility] 2. New A fib - Echo showed EF 60-65% no significant valvular issues. On po amio 400mg  and metop xl per cards. On coumadin, INR in range. Cards signed off today. 3. ESRD - cont HD onMWF schedule.  4. Anemia of CKD-Hgb 7- 8 range. Aranesp 131mcg qWed starting 10/9.  Fe low , got 1 dose 501mg  Feraheme 10/11.  5. Secondary hyperparathyroidism -Ca improved, now corrected 10.3. Phos in goal. Resume normal Ca bath, continue VDRA Last pth 1421, resume sensipar. Continue to follow trends.  6. HTN/volume - close to dry, BP's stable, on metop for afib 7. Nutrition -Renal diet with fluid restrictions.Renavite. Nepro. 8. Dispo - OK for dc from renal  standpoint.    Kelly Splinter MD [employer] pager [phone]   04/21/2018, 1:38 PM

## 2018-04-21 NOTE — Progress Notes (Signed)
PT iv's removed and intact. Vitals stable. Pt has all belongings. Pt denies any complaints. Discharge papers provided to [facility]. Pt to be transported to [facility]. Jerald Kief, RN

## 2018-04-21 NOTE — Progress Notes (Signed)
Clinical Social Worker facilitated patient discharge including contacting patient family and facility to confirm patient discharge plans.  Clinical information faxed to facility and family agreeable with plan.  CSW arranged ambulance transport via [facility] to [place] . RN to call for report prior to discharge [phone] room [phone]. [name]  Clinical Social Worker will sign off for now as social work intervention is no longer needed. Please consult [place] again if new need arises.   Mahamad [employer] [phone]

## 2018-04-21 NOTE — Progress Notes (Signed)
Progress Note  Patient Name: Raymond [name] Date of Encounter: 04/21/2018  Primary Cardiologist: Jenkins Rouge, MD   Subjective   Feeling well.  Continues to have pain in his leg.  No chest pain. Felt short of breath during HD yesterday but was able to complete his session.    Inpatient Medications    Scheduled Meds: . amiodarone  400 mg Oral Daily  . atorvastatin  40 mg Oral Daily  . calcitRIOL  0.75 mcg Oral Q M,W,F-HD  . calcium carbonate  400 mg of elemental calcium Oral BID BM  . Chlorhexidine Gluconate Cloth  6 each Topical Q0600  . Chlorhexidine Gluconate Cloth  6 each Topical Q0600  . Chlorhexidine Gluconate Cloth  6 each Topical Q0600  . Chlorhexidine Gluconate Cloth  6 each Topical Q0600  . cinacalcet  180 mg Oral Q M,W,F-1800  . coumadin book   Does not apply Once  . darbepoetin (ARANESP) injection - DIALYSIS  100 mcg Intravenous Q Wed-HD  . docusate sodium  100 mg Oral Daily  . feeding supplement (NEPRO CARB STEADY)  237 mL Oral TID AC  . metoprolol succinate  100 mg Oral Daily  . multivitamin  1 tablet Oral QHS  . pantoprazole  40 mg Oral Daily  . sevelamer carbonate  2,400 mg Oral TID WC  . warfarin   Does not apply Once  . Warfarin - Pharmacist Dosing Inpatient   Does not apply q1800   Continuous Infusions: . magnesium sulfate 1 - 4 g bolus IVPB     PRN Meds: acetaminophen **OR** acetaminophen, albuterol, bisacodyl, gabapentin, guaiFENesin-dextromethorphan, magnesium sulfate 1 - 4 g bolus IVPB, morphine injection, nitroGLYCERIN, ondansetron, ondansetron, oxyCODONE-acetaminophen, phenol, polyethylene glycol   Vital Signs    Vitals:   04/21/18 0412 04/21/18 0430 04/21/18 0752 04/21/18 0809  BP: 106/78  103/71 101/72  Pulse: 87 89 89 91  Resp: 15 18 15 16   Temp: 98.4 F (36.9 C)  98 F (36.7 C) 99 F (37.2 C)  TempSrc: Oral  Oral Oral  SpO2: 97% 96% 95% 96%  Weight:  72.2 kg    Height:        Intake/Output Summary (Last 24 hours) at 04/21/2018  1239 Last data filed at 04/20/2018 2100 Gross per 24 hour  Intake 360 ml  Output 3493 ml  Net -3133 ml   Filed Weights   04/20/18 0515 04/20/18 1350 04/21/18 0430  Weight: 75.2 kg 71.7 kg 72.2 kg    Telemetry    Sinus rhythm. Paroxysmal atrial fibrillation.  - Personally Reviewed  ECG    n/a - Personally Reviewed  Physical Exam   VS:  BP 101/72 (BP Location: Right Arm)   Pulse 91   Temp 99 F (37.2 C) (Oral)   Resp 16   Ht 5\' 8"  (1.727 m)   Wt 72.2 kg   SpO2 96%   BMI 24.20 kg/m  , BMI Body mass index is 24.2 kg/m. GENERAL:  Chronically ill-appearing.  No acute distress HEENT: Pupils equal round and reactive, fundi not visualized, oral mucosa unremarkable NECK:  No jugular venous distention, waveform within normal limits, carotid upstroke brisk and symmetric, no bruits, no thyromegaly LYMPHATICS:  No cervical adenopathy LUNGS:  Clear to auscultation bilaterally HEART:  RRR.  PMI not displaced or sustained,S1 and S2 within normal limits, no S3, no S4, no clicks, no rubs, II/VI systolic murmur ABD:  Flat, positive bowel sounds normal in frequency in pitch, no bruits, no rebound, no guarding, no  midline pulsatile mass, no hepatomegaly, no splenomegaly EXT:  L LE wrapped.  No edema, no cyanosis no clubbing SKIN:  No rashes no nodules NEURO:  Cranial nerves II through XII grossly intact, motor grossly intact throughout [facility]:  Cognitively intact, oriented to person place and time   Labs    Chemistry Recent Labs  Lab 04/16/18 0749 04/18/18 0800 04/20/18 0924  NA 133* 130* 135  K 4.9 4.9 4.2  CL 92* 94* 95*  CO2 25 21* 26  GLUCOSE 103* 70 105*  BUN 71* 68* 56*  CREATININE 12.75* 10.50* 9.51*  CALCIUM 9.1 8.9 9.4  ALBUMIN 2.5* 2.2* 2.2*  GFRNONAA 4* 5* 5*  GFRAA 4* 5* 6*  ANIONGAP 16* 15 14     Hematology Recent Labs  Lab 04/19/18 0018 04/20/18 0346 04/20/18 0924  WBC 4.0 3.6* 4.1  RBC 2.30* 2.44* 2.38*  HGB 7.4* 7.4* 7.3*  HCT 23.3* 24.9* 24.4*    MCV 101.3* 102.0* 102.5*  MCH 32.2 30.3 30.7  MCHC 31.8 29.7* 29.9*  RDW 15.3 15.7* 15.8*  PLT 165 185 184    Cardiac EnzymesNo results for input(s): TROPONINI in the last 168 hours. No results for input(s): TROPIPOC in the last 168 hours.   BNPNo results for input(s): BNP, PROBNP in the last 168 hours.   DDimer No results for input(s): DDIMER in the last 168 hours.   Radiology    No results found.  Cardiac Studies   Left heart catheterization 03/29/18: Conclusions: 1. Mild, non-obstructive coronary artery disease. 2. Normal left ventricular systolic function with upper normal left ventricular filling pressure.  Recommendations: 1. Medical therapy and risk factor modification to prevent progression of disease. 2. Ok to proceed with vascular surgery from cardiac standpoint. 3. Hemodialysis tomorrow, as scheduled.  Recommend Aspirin 81mg  daily for peripheral vascular disease.  Echo 04/16/18: Study Conclusions  - Left ventricle: Wall thickness was increased in a pattern of   severe LVH. Systolic function was normal. The estimated ejection   fraction was in the range of 60% to 65%. Although no diagnostic   regional wall motion abnormality was identified, this possibility   cannot be completely excluded on the basis of this study. The   study was not technically sufficient to allow evaluation of LV   diastolic dysfunction due to atrial fibrillation. - Aortic valve: There was no stenosis. - Mitral valve: Mildly calcified annulus. There was no significant   regurgitation. - Left atrium: The atrium was moderately dilated. - Right ventricle: The cavity size was normal. Systolic function   was normal. - Pulmonary arteries: No complete TR doppler jet so unable to   estimate PA systolic pressure. - Inferior vena cava: The vessel was normal in size. The   respirophasic diameter changes were in the normal range (>= 50%),   consistent with normal central venous  pressure.  Impressions:  - The patient was in rapid atrial fibrillation. Normal LV size with   severe LV hypertrophy. EF 60-65%. Normal RV size and systolic   function. No significant valvular abnormalities.  Patient Profile     62 y.o. male with ESRD on HD, Hepatitis C, PAD, hypertension, hyperlipidemia, non-obstructive CAD, COPD and tobacco abuse here with atrial fibrillation with RVR.  Assessment & Plan    # Atrial fibrillation with RVR: Rates better controlled after increasing metoprolol.  He continues to have some episodes occasionally to 120 but this is limited.  In general HR is in the 90s.  Continue amiodarone 400mg  daily for  3 days then reduce to 200mg  on 10/16.  He will need close LFT monitoring given his history of Hepatitis C.  Warfarin was started this admission.  Consider DCCV if rates poorly controlled after 3 weeks of therapeutic INR.  Thyroid function normal 03/2018.  # Hypertension: BP stable on higher dose of metoprolol.  Home amlodipine was discontinued.                                                  # PAD: Continue atorvastatin.       For questions or updates, please contact [place] Please consult [email or web address] for contact info under    [place] will sign off.   Medication Recommendations:  Continue amiodarone 400mg  daily x3 days then 200mg  daily Other recommendations (labs, testing, etc):   Follow up as an outpatient:  We will arrange     Signed, Skeet Latch, MD  04/21/2018, 12:39 PM

## 2018-04-23 DIAGNOSIS — N2581 Secondary hyperparathyroidism of renal origin: Secondary | ICD-10-CM | POA: Diagnosis not present

## 2018-04-23 DIAGNOSIS — D631 Anemia in chronic kidney disease: Secondary | ICD-10-CM | POA: Diagnosis not present

## 2018-04-23 DIAGNOSIS — D509 Iron deficiency anemia, unspecified: Secondary | ICD-10-CM | POA: Diagnosis not present

## 2018-04-23 DIAGNOSIS — N186 End stage renal disease: Secondary | ICD-10-CM | POA: Diagnosis not present

## 2018-04-23 LAB — GLUCOSE, CAPILLARY: Glucose-Capillary: 69 mg/dL — ABNORMAL LOW (ref 70–99)

## 2018-04-25 DIAGNOSIS — N186 End stage renal disease: Secondary | ICD-10-CM | POA: Diagnosis not present

## 2018-04-25 DIAGNOSIS — N2581 Secondary hyperparathyroidism of renal origin: Secondary | ICD-10-CM | POA: Diagnosis not present

## 2018-04-25 DIAGNOSIS — D509 Iron deficiency anemia, unspecified: Secondary | ICD-10-CM | POA: Diagnosis not present

## 2018-04-25 DIAGNOSIS — D631 Anemia in chronic kidney disease: Secondary | ICD-10-CM | POA: Diagnosis not present

## 2018-04-27 DIAGNOSIS — N2581 Secondary hyperparathyroidism of renal origin: Secondary | ICD-10-CM | POA: Diagnosis not present

## 2018-04-27 DIAGNOSIS — D509 Iron deficiency anemia, unspecified: Secondary | ICD-10-CM | POA: Diagnosis not present

## 2018-04-27 DIAGNOSIS — N186 End stage renal disease: Secondary | ICD-10-CM | POA: Diagnosis not present

## 2018-04-27 DIAGNOSIS — D631 Anemia in chronic kidney disease: Secondary | ICD-10-CM | POA: Diagnosis not present

## 2018-04-30 DIAGNOSIS — N2581 Secondary hyperparathyroidism of renal origin: Secondary | ICD-10-CM | POA: Diagnosis not present

## 2018-04-30 DIAGNOSIS — D631 Anemia in chronic kidney disease: Secondary | ICD-10-CM | POA: Diagnosis not present

## 2018-04-30 DIAGNOSIS — N186 End stage renal disease: Secondary | ICD-10-CM | POA: Diagnosis not present

## 2018-04-30 DIAGNOSIS — D509 Iron deficiency anemia, unspecified: Secondary | ICD-10-CM | POA: Diagnosis not present

## 2018-05-02 DIAGNOSIS — N186 End stage renal disease: Secondary | ICD-10-CM | POA: Diagnosis not present

## 2018-05-02 DIAGNOSIS — N2581 Secondary hyperparathyroidism of renal origin: Secondary | ICD-10-CM | POA: Diagnosis not present

## 2018-05-02 DIAGNOSIS — D631 Anemia in chronic kidney disease: Secondary | ICD-10-CM | POA: Diagnosis not present

## 2018-05-02 DIAGNOSIS — D509 Iron deficiency anemia, unspecified: Secondary | ICD-10-CM | POA: Diagnosis not present

## 2018-05-04 DIAGNOSIS — N2581 Secondary hyperparathyroidism of renal origin: Secondary | ICD-10-CM | POA: Diagnosis not present

## 2018-05-04 DIAGNOSIS — D631 Anemia in chronic kidney disease: Secondary | ICD-10-CM | POA: Diagnosis not present

## 2018-05-04 DIAGNOSIS — N186 End stage renal disease: Secondary | ICD-10-CM | POA: Diagnosis not present

## 2018-05-04 DIAGNOSIS — D509 Iron deficiency anemia, unspecified: Secondary | ICD-10-CM | POA: Diagnosis not present

## 2018-05-07 DIAGNOSIS — N186 End stage renal disease: Secondary | ICD-10-CM | POA: Diagnosis not present

## 2018-05-07 DIAGNOSIS — N2581 Secondary hyperparathyroidism of renal origin: Secondary | ICD-10-CM | POA: Diagnosis not present

## 2018-05-07 DIAGNOSIS — D509 Iron deficiency anemia, unspecified: Secondary | ICD-10-CM | POA: Diagnosis not present

## 2018-05-07 DIAGNOSIS — D631 Anemia in chronic kidney disease: Secondary | ICD-10-CM | POA: Diagnosis not present

## 2018-05-08 ENCOUNTER — Encounter: Payer: Self-pay | Admitting: Vascular Surgery

## 2018-05-08 ENCOUNTER — Ambulatory Visit (INDEPENDENT_AMBULATORY_CARE_PROVIDER_SITE_OTHER): Payer: Medicare Other | Admitting: Vascular Surgery

## 2018-05-08 ENCOUNTER — Other Ambulatory Visit: Payer: Self-pay

## 2018-05-08 VITALS — BP 101/70 | HR 66 | Temp 97.9°F | Resp 18 | Ht 68.0 in | Wt 163.0 lb

## 2018-05-08 DIAGNOSIS — I739 Peripheral vascular disease, unspecified: Secondary | ICD-10-CM

## 2018-05-08 NOTE — Progress Notes (Signed)
Patient name: Raymond [name] MRN: [id number] DOB: 1956-06-07 Sex: male  REASON FOR VISIT: Follow-up femoral to below-knee popliteal bypass with vein on 04/11/2018  HPI: Raymond [name] is a 62 y.o. male here today for follow-up.  Had severe limiting claudication and underwent left femoral to below-knee popliteal bypass.  He did have some varicosities in the vein but did have a good distal runoff.  He had more than the usual amount of swelling in his left leg following surgery and had noninvasive studies showing no evidence of DVT.  He had some cardiac arrhythmias and other postop issues that kept him in the hospital for prolonged period of time.  He did have some generalized deconditioning and was discharged to nursing facility.  He is on chronic hemodialysis.  He is walking without assistance today and reports that his swelling is improving  Current Outpatient Medications  Medication Sig Dispense Refill  . acetaminophen (TYLENOL) 325 MG tablet Take 2 tablets (650 mg total) by mouth every 6 (six) hours as needed. (Patient taking differently: Take 650 mg by mouth every 6 (six) hours as needed for mild pain. ) 30 tablet 0  . albuterol (PROVENTIL HFA;VENTOLIN HFA) 108 (90 Base) MCG/ACT inhaler Inhale 2 puffs into the lungs every 6 (six) hours as needed for wheezing or shortness of breath. 1 Inhaler 2  . amiodarone (PACERONE) 400 MG tablet Take 1 tablet (400 mg total) by mouth daily. 30 tablet 3  . amLODipine (NORVASC) 5 MG tablet Take 1 tablet (5 mg total) by mouth daily. 90 tablet 1  . aspirin EC 81 MG tablet Take 81 mg by mouth daily.    [name] atorvastatin (LIPITOR) 40 MG tablet Take 1 tablet (40 mg total) by mouth daily. 90 tablet 3  . calcitRIOL (ROCALTROL) 0.25 MCG capsule Take 3 capsules (0.75 mcg total) by mouth every Monday, Wednesday, and Friday with hemodialysis. 30 capsule 3  . cinacalcet (SENSIPAR) 30 MG tablet Take 30 mg by mouth daily.     . Darbepoetin Alfa  (ARANESP) 100 MCG/0.5ML SOSY injection Inject 0.5 mLs (100 mcg total) into the vein every Wednesday with hemodialysis. 4.2 mL 3  . gabapentin (NEURONTIN) 300 MG capsule TAKE 1 CAPSULE BY MOUTH AT BEDTIME AS NEEDED FOR SLEEP (Patient taking differently: Take 300 mg by mouth at bedtime as needed (sleep). ) 90 capsule 3  . metoprolol succinate (TOPROL-XL) 100 MG 24 hr tablet Take 1 tablet (100 mg total) by mouth daily. Take with or immediately following a meal. 60 tablet 3  . multivitamin (RENA-VIT) TABS tablet Take 1 tablet by mouth at bedtime. 30 tablet 3  . ondansetron (ZOFRAN) 4 MG tablet Take 1 tablet (4 mg total) by mouth every 8 (eight) hours as needed for nausea or vomiting. 20 tablet 0  . oxyCODONE-acetaminophen (PERCOCET/ROXICET) 5-325 MG tablet Take 1 tablet by mouth every 4 (four) hours as needed for moderate pain. 20 tablet 0  . pantoprazole (PROTONIX) 40 MG tablet Take 1 tablet (40 mg total) by mouth daily. 30 tablet 0  . sevelamer (RENAGEL) 800 MG tablet Take 4,800 mg by mouth 3 (three) times daily with meals.     . warfarin (COUMADIN) 3 MG tablet Take 1 tablet (3 mg total) by mouth one time only at 6 PM. 30 tablet 3  . metoprolol succinate (TOPROL-XL) 25 MG 24 hr tablet Take 2 tablets (50 mg total) by mouth daily. (Patient not taking: Reported on 05/08/2018) 120 tablet 1   No current facility-administered medications  for this visit.    Facility-Administered Medications Ordered in Other Visits  Medication Dose Route Frequency Provider Last Rate Last Dose  . midazolam (VERSED) 5 MG/5ML injection    Anesthesia Intra-op Sammie Bench, CRNA   2 mg at 04/11/18 1042     PHYSICAL EXAM: Vitals:   05/08/18 1511  BP: 101/70  Pulse: 66  Resp: 18  Temp: 97.9 F (36.6 C)  TempSrc: Oral  SpO2: (!) 66%  Weight: 163 lb (73.9 kg)  Height: 5\' 8"  (1.727 m)    GENERAL: The patient is a well-nourished male, in no acute distress. The vital signs are documented above. Easily palpable  popliteal and dorsalis pedis pulse.  Moderate swelling in his left calf and distally.  All incisions are healing without difficulty  MEDICAL ISSUES: Stable status post left femoral to below-knee popliteal bypass with vein.  We will continue his walking program.  I sent a note to the nursing facility that he would be safe for discharge when he is comfortable walking.  I will see him again in 3 months with noninvasive studies   Rosetta Posner, MD [facility] Tel [phone] Pager [phone]

## 2018-05-09 DIAGNOSIS — N2581 Secondary hyperparathyroidism of renal origin: Secondary | ICD-10-CM | POA: Diagnosis not present

## 2018-05-09 DIAGNOSIS — D509 Iron deficiency anemia, unspecified: Secondary | ICD-10-CM | POA: Diagnosis not present

## 2018-05-09 DIAGNOSIS — N186 End stage renal disease: Secondary | ICD-10-CM | POA: Diagnosis not present

## 2018-05-09 DIAGNOSIS — D631 Anemia in chronic kidney disease: Secondary | ICD-10-CM | POA: Diagnosis not present

## 2018-05-10 DIAGNOSIS — N186 End stage renal disease: Secondary | ICD-10-CM | POA: Diagnosis not present

## 2018-05-10 DIAGNOSIS — I10 Essential (primary) hypertension: Secondary | ICD-10-CM | POA: Diagnosis not present

## 2018-05-10 DIAGNOSIS — I4891 Unspecified atrial fibrillation: Secondary | ICD-10-CM | POA: Diagnosis not present

## 2018-05-10 DIAGNOSIS — I739 Peripheral vascular disease, unspecified: Secondary | ICD-10-CM | POA: Diagnosis not present

## 2018-05-10 DIAGNOSIS — L859 Epidermal thickening, unspecified: Secondary | ICD-10-CM | POA: Diagnosis not present

## 2018-05-10 DIAGNOSIS — K219 Gastro-esophageal reflux disease without esophagitis: Secondary | ICD-10-CM | POA: Diagnosis not present

## 2018-05-10 DIAGNOSIS — G629 Polyneuropathy, unspecified: Secondary | ICD-10-CM | POA: Diagnosis not present

## 2018-05-11 DIAGNOSIS — D509 Iron deficiency anemia, unspecified: Secondary | ICD-10-CM | POA: Diagnosis not present

## 2018-05-11 DIAGNOSIS — N2889 Other specified disorders of kidney and ureter: Secondary | ICD-10-CM | POA: Diagnosis not present

## 2018-05-11 DIAGNOSIS — D631 Anemia in chronic kidney disease: Secondary | ICD-10-CM | POA: Diagnosis not present

## 2018-05-11 DIAGNOSIS — Z992 Dependence on renal dialysis: Secondary | ICD-10-CM | POA: Diagnosis not present

## 2018-05-11 DIAGNOSIS — N186 End stage renal disease: Secondary | ICD-10-CM | POA: Diagnosis not present

## 2018-05-11 DIAGNOSIS — N2581 Secondary hyperparathyroidism of renal origin: Secondary | ICD-10-CM | POA: Diagnosis not present

## 2018-05-14 DIAGNOSIS — D631 Anemia in chronic kidney disease: Secondary | ICD-10-CM | POA: Diagnosis not present

## 2018-05-14 DIAGNOSIS — D509 Iron deficiency anemia, unspecified: Secondary | ICD-10-CM | POA: Diagnosis not present

## 2018-05-14 DIAGNOSIS — N2581 Secondary hyperparathyroidism of renal origin: Secondary | ICD-10-CM | POA: Diagnosis not present

## 2018-05-14 DIAGNOSIS — N186 End stage renal disease: Secondary | ICD-10-CM | POA: Diagnosis not present

## 2018-05-15 ENCOUNTER — Ambulatory Visit (INDEPENDENT_AMBULATORY_CARE_PROVIDER_SITE_OTHER): Payer: Medicare Other | Admitting: Cardiology

## 2018-05-15 ENCOUNTER — Encounter: Payer: Self-pay | Admitting: Cardiology

## 2018-05-15 ENCOUNTER — Telehealth: Payer: Self-pay | Admitting: *Deleted

## 2018-05-15 VITALS — BP 78/44 | HR 78 | Ht 68.0 in | Wt 157.8 lb

## 2018-05-15 DIAGNOSIS — I4819 Other persistent atrial fibrillation: Secondary | ICD-10-CM

## 2018-05-15 DIAGNOSIS — I10 Essential (primary) hypertension: Secondary | ICD-10-CM | POA: Diagnosis not present

## 2018-05-15 DIAGNOSIS — Z720 Tobacco use: Secondary | ICD-10-CM | POA: Diagnosis not present

## 2018-05-15 DIAGNOSIS — I952 Hypotension due to drugs: Secondary | ICD-10-CM

## 2018-05-15 DIAGNOSIS — N186 End stage renal disease: Secondary | ICD-10-CM

## 2018-05-15 DIAGNOSIS — Z992 Dependence on renal dialysis: Secondary | ICD-10-CM

## 2018-05-15 DIAGNOSIS — E782 Mixed hyperlipidemia: Secondary | ICD-10-CM | POA: Diagnosis not present

## 2018-05-15 DIAGNOSIS — Z7901 Long term (current) use of anticoagulants: Secondary | ICD-10-CM | POA: Diagnosis not present

## 2018-05-15 DIAGNOSIS — I209 Angina pectoris, unspecified: Secondary | ICD-10-CM | POA: Diagnosis not present

## 2018-05-15 DIAGNOSIS — I2510 Atherosclerotic heart disease of native coronary artery without angina pectoris: Secondary | ICD-10-CM

## 2018-05-15 DIAGNOSIS — Z95828 Presence of other vascular implants and grafts: Secondary | ICD-10-CM | POA: Diagnosis not present

## 2018-05-15 MED ORDER — METOPROLOL SUCCINATE ER 50 MG PO TB24: 50.0000 mg | ORAL_TABLET | Freq: Every day | ORAL | 3 refills | Status: DC

## 2018-05-15 MED ORDER — AMIODARONE HCL 200 MG PO TABS: 200.0000 mg | ORAL_TABLET | Freq: Every day | ORAL | 3 refills | Status: DC

## 2018-05-15 MED ORDER — AMLODIPINE BESYLATE 2.5 MG PO TABS: 2.5000 mg | ORAL_TABLET | Freq: Every day | ORAL | 3 refills | Status: DC

## 2018-05-15 NOTE — Telephone Encounter (Signed)
[facility]  Called requesting appointment for patient to have wound check. "Small swollen pink knot" on left leg that has worsened since last visit with Dr. Donnetta Hutching. Denies any fever, chills or drainage. Call to [place] at [facility] and appointment given 05/17/2018 @ 3:15 arrival. With PA wound check.

## 2018-05-15 NOTE — Progress Notes (Signed)
Cardiology Office Note   Date:  05/15/2018   ID:  Raymond [name], DOB [birth year], MRN [id number]  PCP:  Scot Jun, FNP  Cardiologist:  Dr. Johnsie Cancel    Chief Complaint  Patient presents with  . Hospitalization Follow-up    paf      History of Present Illness: Raymond [name] is a 62 y.o. male who presents for post hospitalization.  Pt remotely saw Dr. Johnsie Cancel in 2015.  For chest pain.  EKG at that time with diffuse J point elevation and possible pericarditis. ETT was ordered but never done.   Other hx with ESRD on HD MWF, hepatitis, COPD HTN, thyroid disease   CT angio of chest 12.2018 with multivessel coronary vascular disease Rt atrial enlargement.  Cardiomegaly,    Echo 2018 with EF 55-60% G1 DD, Ascending aortic diameter: 42 mm (S). Mildly dilated, PA pk pressure 47 mmHg G1DD  Possible small PFO.    On 03/27/18 for pre-op it was felt cardiac cath was needed with angina with exertion.  Cath revealed mild non obstructive CAD, normal LV systolic function with upper normal LV filling pressure.  He was cleared for fem pop baypass.   He no longer does cocaine.  + tobacco use we discussed him stopping and rare alcohol.    Pt unable to take BP meds due to hypotension with dialysis.  His amlodipine and BB are prn only.  Not on statin.   Pt underwent fem pop bypass  But by 04/15/18 he did have chest pain but was found to be in a fib RVR.  Also hypotensive with this.  CHA2DS2VASc of 2.  Chest pain was felt to be due to A fib with non obstructive disease on recent cath.    A fib with RVR amiodarone to 200 mg on 04/25/18  Follow LFTs with hx Hep C, on Warfarin, DCCV if rates poorly controlled after 3 weeks of therapeutic INR.   PAD on atorvastatin. D/c'd 04/20/18   INR followed by not sure it has been checked.  No labs sent from [facility]. Dialysis on [address] is not doing.  INR here is 1.3 and [facility] does follow and sends to their pharmacy group. Coumadin just increased  to 3.5 mg daily.   Today pt is in SR. No chest pain and no SOB no lightheadedness or dizziness.  His BP is low -stated it was low with dialysis.  He does not feel like it is low. Have called [facility] for list of meds.    Pt is a wheel chair.  He does complain of increased pain to Lt upper thigh, no drainage but very tender to touch and warm compared to Rt thigh also increased edema.  Mild erythema as compared to Rt leg.   Past Medical History:  Diagnosis Date  . Anemia   . CAD (coronary artery disease)    a. 03/2018: cath showing 20 to 30% stenosis along the LAD, luminal irregularities along the RCA, and angiographically normal LCx  . COPD (chronic obstructive pulmonary disease) ([place])   . Dyspnea    "with too much fluid"  . ESRD (end stage renal disease) on dialysis [facility])    "MWF; [facility]" (07/22/17)  . Hemodialysis patient ([place])   . Hepatitis C    Still positive s/p liver biopsy at [facility]  and interferon therapy for 6 months. Most recent lab work was on 10/24/12  . Hepatitis C    "took the tx; gone now" (  12/05/2016)  Was treated  . History of blood transfusion ~ 2012/2013   "related to my kidneys; blood was low"  . Hypertension   . Substance abuse ([place])   . Thyroid disease     Past Surgical History:  Procedure Laterality Date  . ABDOMINAL AORTOGRAM W/LOWER EXTREMITY N/A 02/08/2018   Procedure: ABDOMINAL AORTOGRAM W/LOWER EXTREMITY;  Surgeon: Conrad , MD;  Location: [place] [facility];  Service: Cardiovascular;  Laterality: N/A;  . AV FISTULA PLACEMENT Left Aug. 2013 ?  . FEMORAL-POPLITEAL BYPASS GRAFT Left 04/11/2018   Procedure: BYPASS GRAFT FEMORAL-POPLITEAL ARTERY LEFT LEG;  Surgeon: Rosetta Posner, MD;  Location: [place];  Service: Vascular;  Laterality: Left;  . LEFT HEART CATH AND CORONARY ANGIOGRAPHY N/A 03/29/2018   Procedure: LEFT HEART CATH AND CORONARY ANGIOGRAPHY;  Surgeon: Nelva Bush, MD;  Location: [place] [facility];  Service: Cardiovascular;  Laterality:  N/A;  . LIVER BIOPSY    . ORIF ULNAR FRACTURE Left 05/18/2017   Procedure: OPEN REDUCTION INTERNAL FIXATION (ORIF) ULNAR FRACTURE;  Surgeon: Iran Planas, MD;  Location: [place];  Service: Orthopedics;  Laterality: Left;  . SHUNTOGRAM N/A 12/11/2012   Procedure: Earney Mallet;  Surgeon: Serafina Mitchell, MD;  Location: [facility] CATH LAB;  Service: Cardiovascular;  Laterality: N/A;     Current Outpatient Medications  Medication Sig Dispense Refill  . acetaminophen (TYLENOL) 325 MG tablet Take 650 mg by mouth every 6 (six) hours as needed for mild pain.    [name] albuterol (PROVENTIL HFA;VENTOLIN HFA) 108 (90 Base) MCG/ACT inhaler Inhale 2 puffs into the lungs every 6 (six) hours as needed for wheezing or shortness of breath. 1 Inhaler 2  . aspirin EC 81 MG tablet Take 81 mg by mouth daily.    [name] atorvastatin (LIPITOR) 40 MG tablet Take 1 tablet (40 mg total) by mouth daily. 90 tablet 3  . calcitRIOL (ROCALTROL) 0.25 MCG capsule Take 3 capsules (0.75 mcg total) by mouth every Monday, Wednesday, and Friday with hemodialysis. 30 capsule 3  . cinacalcet (SENSIPAR) 30 MG tablet Take 30 mg by mouth daily.     . Darbepoetin Alfa (ARANESP) 100 MCG/0.5ML SOSY injection Inject 0.5 mLs (100 mcg total) into the vein every Wednesday with hemodialysis. 4.2 mL 3  . gabapentin (NEURONTIN) 300 MG capsule Take 300 mg by mouth at bedtime as needed (sleep).    . multivitamin (RENA-VIT) TABS tablet Take 1 tablet by mouth at bedtime. 30 tablet 3  . ondansetron (ZOFRAN) 4 MG tablet Take 1 tablet (4 mg total) by mouth every 8 (eight) hours as needed for nausea or vomiting. 20 tablet 0  . oxyCODONE-acetaminophen (PERCOCET/ROXICET) 5-325 MG tablet Take 1 tablet by mouth every 4 (four) hours as needed for moderate pain. 20 tablet 0  . pantoprazole (PROTONIX) 40 MG tablet Take 1 tablet (40 mg total) by mouth daily. 30 tablet 0  . sevelamer (RENAGEL) 800 MG tablet Take 4,800 mg by mouth 3 (three) times daily with meals.     . warfarin  (COUMADIN) 3 MG tablet Take 1 tablet (3 mg total) by mouth one time only at 6 PM. 30 tablet 3  . amiodarone (PACERONE) 200 MG tablet Take 1 tablet (200 mg total) by mouth daily. 90 tablet 3  . amLODipine (NORVASC) 2.5 MG tablet Take 1 tablet (2.5 mg total) by mouth daily. 90 tablet 3  . metoprolol succinate (TOPROL-XL) 50 MG 24 hr tablet Take 1 tablet (50 mg total) by mouth daily. Take with or  immediately following a meal. 90 tablet 3   No current facility-administered medications for this visit.    Facility-Administered Medications Ordered in Other Visits  Medication Dose Route Frequency Provider Last Rate Last Dose  . midazolam (VERSED) 5 MG/5ML injection    Anesthesia Intra-op Sammie Bench, CRNA   2 mg at 04/11/18 1042    Allergies:   Patient has no known allergies.    Social History:  The patient  reports that he has been smoking cigarettes. He has a 25.00 pack-year smoking history. He has never used smokeless tobacco. He reports that he drinks alcohol. He reports that he has current or past drug history. Drug: Cocaine.   Family History:  The patient's family history includes Diabetes in his father; Heart disease in his father; Hypertension in his father.    ROS:  General:no colds or fevers, no weight changes Skin:no rashes or ulcers HEENT:no blurred vision, no congestion CV:see HPI PUL:see HPI GI:no diarrhea constipation or melena, no indigestion GU:no hematuria, no dysuria MS:no joint pain, + Lt upper leg pain.  + swelling much worse per pt than when seen by Dr. Donnetta Hutching. Neuro:no syncope, no lightheadedness Endo:no diabetes, no thyroid disease  Wt Readings from Last 3 Encounters:  05/15/18 157 lb 12.8 oz (71.6 kg)  05/08/18 163 lb (73.9 kg)  04/21/18 159 lb 2.8 oz (72.2 kg)     PHYSICAL EXAM: VS:  BP (!) 78/44   Pulse 78   Ht 5\' 8"  (1.727 m)   Wt 157 lb 12.8 oz (71.6 kg)   SpO2 97%   BMI 23.99 kg/m  , BMI Body mass index is 23.99 kg/m. General:Pleasant  affect, NAD Skin:Warm and dry, brisk capillary refill HEENT:normocephalic, sclera clear, mucus membranes moist Neck:supple, no JVD, no bruits  Heart:S1S2 RRR without murmur, gallup, rub or click Lungs:clear without rales, rhonchi, or wheezes WRU:EAVW, non tender, + BS, do not palpate liver spleen or masses Ext:Lt upper thigh with edema and hard are lt thigh and mild erythema, very tender to touch., 2+ radial pulses Neuro:alert and oriented X 3, MAE, follows commands, + facial symmetry    EKG:  EKG is ordered today. The ekg ordered today demonstrates SR with non specific T wave abnormality.     Recent Labs: 03/31/2018: Magnesium 2.5 04/01/2018: TSH 2.470 04/11/2018: ALT 10 04/20/2018: BUN 56; Creatinine, Ser 9.51; Hemoglobin 7.3; Platelets 184; Potassium 4.2; Sodium 135    Lipid Panel    Component Value Date/Time   CHOL 158 03/27/2018 1420   TRIG 74 03/27/2018 1420   HDL 74 03/27/2018 1420   CHOLHDL 2.1 03/27/2018 1420   CHOLHDL 2.0 Ratio 10/04/2007 2023   VLDL 31 10/04/2007 2023   LDLCALC 69 03/27/2018 1420       Other studies Reviewed: Additional studies/ records that were reviewed today include: . Left heart catheterization 03/29/18: Conclusions: 1. Mild, non-obstructive coronary artery disease. 2. Normal left ventricular systolic function with upper normal left ventricular filling pressure.  Recommendations: 1. Medical therapy and risk factor modification to prevent progression of disease. 2. Ok to proceed with vascular surgery from cardiac standpoint. 3. Hemodialysis tomorrow, as scheduled.  Recommend Aspirin 81mg  daily for peripheral vascular disease.  Echo 04/16/18: Study Conclusions  - Left ventricle: Wall thickness was increased in a pattern of severe LVH. Systolic function was normal. The estimated ejection fraction was in the range of 60% to 65%. Although no diagnostic regional wall motion abnormality was identified, this possibility cannot  be completely excluded on the basis  of this study. The study was not technically sufficient to allow evaluation of LV diastolic dysfunction due to atrial fibrillation. - Aortic valve: There was no stenosis. - Mitral valve: Mildly calcified annulus. There was no significant regurgitation. - Left atrium: The atrium was moderately dilated. - Right ventricle: The cavity size was normal. Systolic function was normal. - Pulmonary arteries: No complete TR doppler jet so unable to estimate PA systolic pressure. - Inferior vena cava: The vessel was normal in size. The respirophasic diameter changes were in the normal range (>= 50%), consistent with normal central venous pressure.  Impressions:  - The patient was in rapid atrial fibrillation. Normal LV size with severe LV hypertrophy. EF 60-65%. Normal RV size and systolic function. No significant valvular abnormalities.  ASSESSMENT AND PLAN:  1.   PAF, now SR has been on 400 mg amiodarone since starting 04/16/18, at discharge order notes decrease to 200 mg per day on the 16th.  Will decrease to 200 mg daily now. Will check CMP, and tsh secondary to amiodarone.    2.   Anticoagulation on coumadin with subtherapeutic INR , followed by [facility]     3.  Hypotension, asymptomatic today, will decrease amlodipine to 2.5 mg daily and toprol XL to 50 mg daily.     4.   CAD minimal by cath prior to fem pop surgery.  5.  Fem-pop bypass, contacted Dr. Luther Parody office for visit this week, for increased edema and pain. They will call [facility] to arrange visit.  6.  Hx hepatitis C will check hepatic.  7.  HLD on statin, continue with CAD and PAD  8.  ESRD on HD , MWF at [facility].   9.  Tobacco use, has decreased discussed his stopping.  Will have pt follow up in 4-5 weeks for re-eval with myself or Dr. Johnsie Cancel.   I sent a written prescription to [facility] with medication changes.     Current  medicines are reviewed with the patient today.  The patient Has no concerns regarding medicines.  The following changes have been made:  See above Labs/ tests ordered today include:see above  Disposition:   FU:  see above  Signed, Cecilie Kicks, NP  05/15/2018 4:44 PM    [facility] [place], [address] [place], [place] Phone: [phone]; Fax: [phone]

## 2018-05-15 NOTE — Patient Instructions (Addendum)
Medication Instructions:  Your physician has recommended you make the following change in your medication:  1.  DECREASE the Amiodarone to 200 mg daily 2.  DECREASE the Amlodipine to 2.5 mg daily 3.  DECREASE the Metoprolol to 50 mg daily   If you need a refill on your cardiac medications before your next appointment, please call your pharmacy.   Lab work: TODAY:  CMET  If you have labs (blood work) drawn today and your tests are completely normal, you will receive your results only by: [name] MyChart Message (if you have MyChart) OR . A paper copy in the mail If you have any lab test that is abnormal or we need to change your treatment, we will call you to review the results.  Testing/Procedures:   Follow-Up: Your physician recommends that you schedule a follow-up appointment in: 06/14/18 ARRIVE AT 1:15 TO SEE Cecilie Kicks, NP  Any Other Special Instructions Will Be Listed Below (If Applicable).

## 2018-05-16 DIAGNOSIS — D509 Iron deficiency anemia, unspecified: Secondary | ICD-10-CM | POA: Diagnosis not present

## 2018-05-16 DIAGNOSIS — N186 End stage renal disease: Secondary | ICD-10-CM | POA: Diagnosis not present

## 2018-05-16 DIAGNOSIS — D631 Anemia in chronic kidney disease: Secondary | ICD-10-CM | POA: Diagnosis not present

## 2018-05-16 DIAGNOSIS — N2581 Secondary hyperparathyroidism of renal origin: Secondary | ICD-10-CM | POA: Diagnosis not present

## 2018-05-16 LAB — COMPREHENSIVE METABOLIC PANEL
ALBUMIN: 3.7 g/dL (ref 3.6–4.8)
ALK PHOS: 92 IU/L (ref 39–117)
ALT: 5 IU/L (ref 0–44)
AST: 13 IU/L (ref 0–40)
Albumin/Globulin Ratio: 0.9 — ABNORMAL LOW (ref 1.2–2.2)
BILIRUBIN TOTAL: 0.3 mg/dL (ref 0.0–1.2)
BUN / CREAT RATIO: 4 — AB (ref 10–24)
BUN: 38 mg/dL — ABNORMAL HIGH (ref 8–27)
CO2: 23 mmol/L (ref 20–29)
Calcium: 9.1 mg/dL (ref 8.6–10.2)
Chloride: 95 mmol/L — ABNORMAL LOW (ref 96–106)
Creatinine, Ser: 9.13 mg/dL — ABNORMAL HIGH (ref 0.76–1.27)
GFR calc non Af Amer: 5 mL/min/{1.73_m2} — ABNORMAL LOW (ref >59)
GFR, EST AFRICAN AMERICAN: 6 mL/min/{1.73_m2} — AB (ref >59)
GLOBULIN, TOTAL: 3.9 g/dL (ref 1.5–4.5)
Glucose: 83 mg/dL (ref 65–99)
POTASSIUM: 4.4 mmol/L (ref 3.5–5.2)
Sodium: 140 mmol/L (ref 134–144)
Total Protein: 7.6 g/dL (ref 6.0–8.5)

## 2018-05-16 NOTE — Addendum Note (Signed)
Addended by: Jones Broom on: 05/16/2018 01:16 PM   Modules accepted: Orders

## 2018-05-17 ENCOUNTER — Encounter: Payer: Self-pay | Admitting: Family

## 2018-05-17 ENCOUNTER — Ambulatory Visit (INDEPENDENT_AMBULATORY_CARE_PROVIDER_SITE_OTHER): Payer: Self-pay | Admitting: Family

## 2018-05-17 ENCOUNTER — Other Ambulatory Visit: Payer: Self-pay

## 2018-05-17 ENCOUNTER — Ambulatory Visit: Payer: Medicare Other

## 2018-05-17 VITALS — BP 98/63 | HR 69 | Temp 96.8°F | Resp 16 | Ht 68.0 in | Wt 155.0 lb

## 2018-05-17 DIAGNOSIS — T148XXA Other injury of unspecified body region, initial encounter: Secondary | ICD-10-CM

## 2018-05-17 DIAGNOSIS — F17200 Nicotine dependence, unspecified, uncomplicated: Secondary | ICD-10-CM

## 2018-05-17 DIAGNOSIS — I739 Peripheral vascular disease, unspecified: Secondary | ICD-10-CM

## 2018-05-17 NOTE — Progress Notes (Signed)
[facility] [place]   CC: Follow up peripheral artery occlusive disease  History of Present Illness Raymond [name] is a 62 y.o. male who is s/p left femoral to below-knee popliteal bypass with vein on 04/11/2018 by Dr. Donnetta Hutching. He had some varicosities in the vein but did have a good distal runoff.  He had more than the usual amount of swelling in his left leg following surgery and had noninvasive studies showing no evidence of DVT.  He had some cardiac arrhythmias and other postop issues that kept him in the hospital for prolonged period of time.  He did have some generalized deconditioning and was discharged to nursing facility.  He is on chronic hemodialysis.  He is walking without assistance.    Dr Early last evaluated pt 9 days ago, on 05-08-18. At that time there was an easily palpable popliteal and dorsalis pedis pulse in the left. There was swelling in his left calf and distally.  All incisions were healing without difficulty. Stable status post left femoral to below-knee popliteal bypass with vein.  Continue his walking program.  Dr. Donnetta Hutching sent a note to the nursing facility that he would be safe for discharge when he is comfortable walking.  Pt to return in 3 months with noninvasive studies.  He dialyzes MWF at [facility]. His medical history also includes substance abuse and CAD.  On 04-20-18 he was prescribed 5-325 mg percocet, Take 1 tablet by mouth every 4 (four) hours as needed for moderate pain, #20 dispensed, 0 refills.  He is also prescribed gabapentin, states he does not take.   Pt returns today after [facility]  requested appointment for patient to have wound check.  Pt notes pain and swelling at medial left thigh incision x 4 days.  Denies any fever, chills or drainage.  He was discharged yesterday from the rehab center, was performing physical therapy.  He takes warfarin for a hx of atrial fib.    Hematoma left medial thigh.   Tobacco use:  smoker  (1 ppd x 25 yrs)  Pt meds include: Statin :Yes Betablocker: Yes ASA: Yes Other anticoagulants/antiplatelets: warfarin, has atrial fib  Past Medical History:  Diagnosis Date  . Anemia   . CAD (coronary artery disease)    a. 03/2018: cath showing 20 to 30% stenosis along the LAD, luminal irregularities along the RCA, and angiographically normal LCx  . COPD (chronic obstructive pulmonary disease) ([place])   . Dyspnea    "with too much fluid"  . ESRD (end stage renal disease) on dialysis [facility])    "MWF; [facility]" (07/22/17)  . Hemodialysis patient ([place])   . Hepatitis C    Still positive s/p liver biopsy at [facility]  and interferon therapy for 6 months. Most recent lab work was on 10/24/12  . Hepatitis C    "took the tx; gone now" (12/05/2016)  Was treated  . History of blood transfusion ~ 2012/2013   "related to my kidneys; blood was low"  . Hypertension   . Substance abuse ([place])   . Thyroid disease     Social History Social History   Tobacco Use  . Smoking status: Current Every Day Smoker    Packs/day: 1.00    Years: 25.00    Pack years: 25.00    Types: Cigarettes    Last attempt to quit: 08/01/2011    Years since quitting: 6.7  . Smokeless tobacco: Never Used  Substance Use Topics  . Alcohol use:  Yes    Comment: occasionally  . Drug use: Yes    Types: Cocaine    Comment: clean x approx 2 months (07/22/17)    Family History Family History  Problem Relation Age of Onset  . Diabetes Father   . Hypertension Father   . Heart disease Father     Past Surgical History:  Procedure Laterality Date  . ABDOMINAL AORTOGRAM W/LOWER EXTREMITY N/A 02/08/2018   Procedure: ABDOMINAL AORTOGRAM W/LOWER EXTREMITY;  Surgeon: Conrad Hecla, MD;  Location: [place] [facility];  Service: Cardiovascular;  Laterality: N/A;  . AV FISTULA PLACEMENT Left Aug. 2013 ?  . FEMORAL-POPLITEAL BYPASS GRAFT Left 04/11/2018   Procedure: BYPASS GRAFT FEMORAL-POPLITEAL ARTERY LEFT LEG;  Surgeon:  Rosetta Posner, MD;  Location: [place];  Service: Vascular;  Laterality: Left;  . LEFT HEART CATH AND CORONARY ANGIOGRAPHY N/A 03/29/2018   Procedure: LEFT HEART CATH AND CORONARY ANGIOGRAPHY;  Surgeon: Nelva Bush, MD;  Location: [place] [facility];  Service: Cardiovascular;  Laterality: N/A;  . LIVER BIOPSY    . ORIF ULNAR FRACTURE Left 05/18/2017   Procedure: OPEN REDUCTION INTERNAL FIXATION (ORIF) ULNAR FRACTURE;  Surgeon: Iran Planas, MD;  Location: [place];  Service: Orthopedics;  Laterality: Left;  . SHUNTOGRAM N/A 12/11/2012   Procedure: Earney Mallet;  Surgeon: Serafina Mitchell, MD;  Location: [facility] CATH LAB;  Service: Cardiovascular;  Laterality: N/A;    No Known Allergies  Current Outpatient Medications  Medication Sig Dispense Refill  . acetaminophen (TYLENOL) 325 MG tablet Take 650 mg by mouth every 6 (six) hours as needed for mild pain.    [name] albuterol (PROVENTIL HFA;VENTOLIN HFA) 108 (90 Base) MCG/ACT inhaler Inhale 2 puffs into the lungs every 6 (six) hours as needed for wheezing or shortness of breath. (Patient not taking: Reported on 05/17/2018) 1 Inhaler 2  . amiodarone (PACERONE) 200 MG tablet Take 1 tablet (200 mg total) by mouth daily. (Patient not taking: Reported on 05/17/2018) 90 tablet 3  . amLODipine (NORVASC) 2.5 MG tablet Take 1 tablet (2.5 mg total) by mouth daily. (Patient not taking: Reported on 05/17/2018) 90 tablet 3  . aspirin EC 81 MG tablet Take 81 mg by mouth daily.    [name] atorvastatin (LIPITOR) 40 MG tablet Take 1 tablet (40 mg total) by mouth daily. (Patient not taking: Reported on 05/17/2018) 90 tablet 3  . calcitRIOL (ROCALTROL) 0.25 MCG capsule Take 3 capsules (0.75 mcg total) by mouth every Monday, Wednesday, and Friday with hemodialysis. (Patient not taking: Reported on 05/17/2018) 30 capsule 3  . cinacalcet (SENSIPAR) 30 MG tablet Take 30 mg by mouth daily.     . Darbepoetin Alfa (ARANESP) 100 MCG/0.5ML SOSY injection Inject 0.5 mLs (100 mcg total) into the vein  every Wednesday with hemodialysis. (Patient not taking: Reported on 05/17/2018) 4.2 mL 3  . gabapentin (NEURONTIN) 300 MG capsule Take 300 mg by mouth at bedtime as needed (sleep).    . metoprolol succinate (TOPROL-XL) 50 MG 24 hr tablet Take 1 tablet (50 mg total) by mouth daily. Take with or immediately following a meal. (Patient not taking: Reported on 05/17/2018) 90 tablet 3  . multivitamin (RENA-VIT) TABS tablet Take 1 tablet by mouth at bedtime. (Patient not taking: Reported on 05/17/2018) 30 tablet 3  . ondansetron (ZOFRAN) 4 MG tablet Take 1 tablet (4 mg total) by mouth every 8 (eight) hours as needed for nausea or vomiting. (Patient not taking: Reported on 05/17/2018) 20 tablet 0  . oxyCODONE-acetaminophen (PERCOCET/ROXICET) 5-325  MG tablet Take 1 tablet by mouth every 4 (four) hours as needed for moderate pain. (Patient not taking: Reported on 05/17/2018) 20 tablet 0  . pantoprazole (PROTONIX) 40 MG tablet Take 1 tablet (40 mg total) by mouth daily. (Patient not taking: Reported on 05/17/2018) 30 tablet 0  . sevelamer (RENAGEL) 800 MG tablet Take 4,800 mg by mouth 3 (three) times daily with meals.     . warfarin (COUMADIN) 3 MG tablet Take 1 tablet (3 mg total) by mouth one time only at 6 PM. (Patient not taking: Reported on 05/17/2018) 30 tablet 3   No current facility-administered medications for this visit.    Facility-Administered Medications Ordered in Other Visits  Medication Dose Route Frequency Provider Last Rate Last Dose  . midazolam (VERSED) 5 MG/5ML injection    Anesthesia Intra-op Sammie Bench, CRNA   2 mg at 04/11/18 1042    ROS: See HPI for pertinent positives and negatives.   Physical Examination  Vitals:   05/17/18 1441  BP: 98/63  Pulse: 69  Resp: 16  Temp: (!) 96.8 F (36 C)  TempSrc: Oral  SpO2: 98%  Weight: 155 lb (70.3 kg)  Height: 5\' 8"  (1.727 m)   Body mass index is 23.57 kg/m.  General: A&O x 3, WDWN, male. Gait: normal HENT: No gross  abnormalities.  Pulmonary: Respirations are non laboredfields  Radial pulses are 2+ palpable bilaterally   Adominal aortic pulse is not palpable                         With hEXAM: Extremities without ischemic changes, without Gangrene; without open wounds. Hard mass, non pulsatile, consistent with hematoma at left medial thigh incision, no drainage, no signs of ischemia, see photo below.     in Left thigh                                                                                                            LE Pulses Right Left       FEMORAL  2+ palpable  not palpable, moderate swelling in groin        POPLITEAL  not palpable    palpable       POSTERIOR TIBIAL  not palpable   not palpable        DORSALIS PEDIS      ANTERIOR TIBIAL not palpable  2+ palpable    Abdomen: soft, NT, no palpable masses. Skin: no rashes, no cellulitis, no ulcers noted. Musculoskeletal: no muscle wasting or atrophy.  Neurologic: A&O X 3; appropriate affect, Sensation is normal; MOTOR FUNCTION:  moving all extremities equally, motor strength 5/5 throughout. Speech is fluent/normal. CN 2-12 intact. Psychiatric: Thought content is normal, mood appropriate for clinical situation.     ASSESSMENT: Raymond [name] is a 62 y.o. male who is s/p left femoral to below-knee popliteal bypass with vein on 04/11/2018.    He was discharged from the rehab center yesterday, was receiving physical therapy.   Dr. Oneida Alar spoke with and  examined pt. Hematoma, non pulsatile, beneath incision at left medial thigh. Pt takes coumadin for atrial fib which encourages bleeding from surgical site.  Heat packs alternating with ice packs to hematoma. Follow up with non invasive testing and see Dr. Donnetta Hutching as scheduled in 3 months. Hematoma will likely takes several weeks to resolve.      PLAN: Follow up as scheduled on 07-31-18 with ABI's and see Dr. Donnetta Hutching.   I discussed in depth with the patient the nature of  atherosclerosis, and emphasized the importance of maximal medical management including strict control of blood pressure, blood glucose, and lipid levels, obtaining regular exercise, and cessation of smoking.  The patient is aware that without maximal medical management the underlying atherosclerotic disease process will progress, limiting the benefit of any interventions.   The patient was given information about PAD including signs, symptoms, treatment, what symptoms should prompt the patient to seek immediate medical care, and risk reduction measures to take.  Clemon Chambers, RN, MSN, FNP-C [facility] Phone: [phone]  Clinic MD: [facility]  05/17/18 3:07 PM

## 2018-05-17 NOTE — Patient Instructions (Signed)
Steps to Quit Smoking Smoking tobacco can be bad for your health. It can also affect almost every organ in your body. Smoking puts you and people around you at risk for many serious long-lasting (chronic) diseases. Quitting smoking is hard, but it is one of the best things that you can do for your health. It is never too late to quit. What are the benefits of quitting smoking? When you quit smoking, you lower your risk for getting serious diseases and conditions. They can include:  Lung cancer or lung disease.  Heart disease.  Stroke.  Heart attack.  Not being able to have children (infertility).  Weak bones (osteoporosis) and broken bones (fractures).  If you have coughing, wheezing, and shortness of breath, those symptoms may get better when you quit. You may also get sick less often. If you are pregnant, quitting smoking can help to lower your chances of having a baby of low birth weight. What can I do to help me quit smoking? Talk with your doctor about what can help you quit smoking. Some things you can do (strategies) include:  Quitting smoking totally, instead of slowly cutting back how much you smoke over a period of time.  Going to in-person counseling. You are more likely to quit if you go to many counseling sessions.  Using resources and support systems, such as: ? Online chats with a counselor. ? Phone quitlines. ? Printed self-help materials. ? Support groups or group counseling. ? Text messaging programs. ? Mobile phone apps or applications.  Taking medicines. Some of these medicines may have nicotine in them. If you are pregnant or breastfeeding, do not take any medicines to quit smoking unless your doctor says it is okay. Talk with your doctor about counseling or other things that can help you.  Talk with your doctor about using more than one strategy at the same time, such as taking medicines while you are also going to in-person counseling. This can help make  quitting easier. What things can I do to make it easier to quit? Quitting smoking might feel very hard at first, but there is a lot that you can do to make it easier. Take these steps:  Talk to your family and friends. Ask them to support and encourage you.  Call phone quitlines, reach out to support groups, or work with a counselor.  Ask people who smoke to not smoke around you.  Avoid places that make you want (trigger) to smoke, such as: ? Bars. ? Parties. ? Smoke-break areas at work.  Spend time with people who do not smoke.  Lower the stress in your life. Stress can make you want to smoke. Try these things to help your stress: ? Getting regular exercise. ? Deep-breathing exercises. ? Yoga. ? Meditating. ? Doing a body scan. To do this, close your eyes, focus on one area of your body at a time from head to toe, and notice which parts of your body are tense. Try to relax the muscles in those areas.  Download or buy apps on your mobile phone or tablet that can help you stick to your quit plan. There are many free apps, such as QuitGuide from the CDC (Centers for Disease Control and Prevention). You can find more support from smokefree.gov and other websites.  This information is not intended to replace advice given to you by your health care provider. Make sure you discuss any questions you have with your health care provider. Document Released: 04/23/2009 Document   Revised: 02/23/2016 Document Reviewed: 11/11/2014 Elsevier Interactive Patient Education  2018 Elsevier Inc.     Peripheral Vascular Disease Peripheral vascular disease (PVD) is a disease of the blood vessels that are not part of your heart and brain. A simple term for PVD is poor circulation. In most cases, PVD narrows the blood vessels that carry blood from your heart to the rest of your body. This can result in a decreased supply of blood to your arms, legs, and internal organs, like your stomach or kidneys.  However, it most often affects a person's lower legs and feet. There are two types of PVD.  Organic PVD. This is the more common type. It is caused by damage to the structure of blood vessels.  Functional PVD. This is caused by conditions that make blood vessels contract and tighten (spasm).  Without treatment, PVD tends to get worse over time. PVD can also lead to acute ischemic limb. This is when an arm or limb suddenly has trouble getting enough blood. This is a medical emergency. Follow these instructions at home:  Take medicines only as told by your doctor.  Do not use any tobacco products, including cigarettes, chewing tobacco, or electronic cigarettes. If you need help quitting, ask your doctor.  Lose weight if you are overweight, and maintain a healthy weight as told by your doctor.  Eat a diet that is low in fat and cholesterol. If you need help, ask your doctor.  Exercise regularly. Ask your doctor for some good activities for you.  Take good care of your feet. ? Wear comfortable shoes that fit well. ? Check your feet often for any cuts or sores. Contact a doctor if:  You have cramps in your legs while walking.  You have leg pain when you are at rest.  You have coldness in a leg or foot.  Your skin changes.  You are unable to get or have an erection (erectile dysfunction).  You have cuts or sores on your feet that are not healing. Get help right away if:  Your arm or leg turns cold and blue.  Your arms or legs become red, warm, swollen, painful, or numb.  You have chest pain or trouble breathing.  You suddenly have weakness in your face, arm, or leg.  You become very confused or you cannot speak.  You suddenly have a very bad headache.  You suddenly cannot see. This information is not intended to replace advice given to you by your health care provider. Make sure you discuss any questions you have with your health care provider. Document Released:  09/21/2009 Document Revised: 12/03/2015 Document Reviewed: 12/05/2013 Elsevier Interactive Patient Education  2017 Elsevier Inc.  

## 2018-05-18 DIAGNOSIS — N2581 Secondary hyperparathyroidism of renal origin: Secondary | ICD-10-CM | POA: Diagnosis not present

## 2018-05-18 DIAGNOSIS — D631 Anemia in chronic kidney disease: Secondary | ICD-10-CM | POA: Diagnosis not present

## 2018-05-18 DIAGNOSIS — N186 End stage renal disease: Secondary | ICD-10-CM | POA: Diagnosis not present

## 2018-05-18 DIAGNOSIS — D509 Iron deficiency anemia, unspecified: Secondary | ICD-10-CM | POA: Diagnosis not present

## 2018-05-21 DIAGNOSIS — D631 Anemia in chronic kidney disease: Secondary | ICD-10-CM | POA: Diagnosis not present

## 2018-05-21 DIAGNOSIS — N186 End stage renal disease: Secondary | ICD-10-CM | POA: Diagnosis not present

## 2018-05-21 DIAGNOSIS — D509 Iron deficiency anemia, unspecified: Secondary | ICD-10-CM | POA: Diagnosis not present

## 2018-05-21 DIAGNOSIS — N2581 Secondary hyperparathyroidism of renal origin: Secondary | ICD-10-CM | POA: Diagnosis not present

## 2018-05-23 DIAGNOSIS — D509 Iron deficiency anemia, unspecified: Secondary | ICD-10-CM | POA: Diagnosis not present

## 2018-05-23 DIAGNOSIS — N186 End stage renal disease: Secondary | ICD-10-CM | POA: Diagnosis not present

## 2018-05-23 DIAGNOSIS — D631 Anemia in chronic kidney disease: Secondary | ICD-10-CM | POA: Diagnosis not present

## 2018-05-23 DIAGNOSIS — N2581 Secondary hyperparathyroidism of renal origin: Secondary | ICD-10-CM | POA: Diagnosis not present

## 2018-05-28 DIAGNOSIS — D631 Anemia in chronic kidney disease: Secondary | ICD-10-CM | POA: Diagnosis not present

## 2018-05-28 DIAGNOSIS — N186 End stage renal disease: Secondary | ICD-10-CM | POA: Diagnosis not present

## 2018-05-28 DIAGNOSIS — N2581 Secondary hyperparathyroidism of renal origin: Secondary | ICD-10-CM | POA: Diagnosis not present

## 2018-05-28 DIAGNOSIS — D509 Iron deficiency anemia, unspecified: Secondary | ICD-10-CM | POA: Diagnosis not present

## 2018-05-30 DIAGNOSIS — N2581 Secondary hyperparathyroidism of renal origin: Secondary | ICD-10-CM | POA: Diagnosis not present

## 2018-05-30 DIAGNOSIS — N186 End stage renal disease: Secondary | ICD-10-CM | POA: Diagnosis not present

## 2018-05-30 DIAGNOSIS — D631 Anemia in chronic kidney disease: Secondary | ICD-10-CM | POA: Diagnosis not present

## 2018-05-30 DIAGNOSIS — D509 Iron deficiency anemia, unspecified: Secondary | ICD-10-CM | POA: Diagnosis not present

## 2018-06-03 DIAGNOSIS — D509 Iron deficiency anemia, unspecified: Secondary | ICD-10-CM | POA: Diagnosis not present

## 2018-06-03 DIAGNOSIS — N186 End stage renal disease: Secondary | ICD-10-CM | POA: Diagnosis not present

## 2018-06-03 DIAGNOSIS — D631 Anemia in chronic kidney disease: Secondary | ICD-10-CM | POA: Diagnosis not present

## 2018-06-03 DIAGNOSIS — N2581 Secondary hyperparathyroidism of renal origin: Secondary | ICD-10-CM | POA: Diagnosis not present

## 2018-06-05 DIAGNOSIS — D631 Anemia in chronic kidney disease: Secondary | ICD-10-CM | POA: Diagnosis not present

## 2018-06-05 DIAGNOSIS — N2581 Secondary hyperparathyroidism of renal origin: Secondary | ICD-10-CM | POA: Diagnosis not present

## 2018-06-05 DIAGNOSIS — D509 Iron deficiency anemia, unspecified: Secondary | ICD-10-CM | POA: Diagnosis not present

## 2018-06-05 DIAGNOSIS — N186 End stage renal disease: Secondary | ICD-10-CM | POA: Diagnosis not present

## 2018-06-10 DIAGNOSIS — N2889 Other specified disorders of kidney and ureter: Secondary | ICD-10-CM | POA: Diagnosis not present

## 2018-06-10 DIAGNOSIS — Z992 Dependence on renal dialysis: Secondary | ICD-10-CM | POA: Diagnosis not present

## 2018-06-10 DIAGNOSIS — N186 End stage renal disease: Secondary | ICD-10-CM | POA: Diagnosis not present

## 2018-06-11 DIAGNOSIS — N186 End stage renal disease: Secondary | ICD-10-CM | POA: Diagnosis not present

## 2018-06-11 DIAGNOSIS — N2581 Secondary hyperparathyroidism of renal origin: Secondary | ICD-10-CM | POA: Diagnosis not present

## 2018-06-11 DIAGNOSIS — D509 Iron deficiency anemia, unspecified: Secondary | ICD-10-CM | POA: Diagnosis not present

## 2018-06-12 DIAGNOSIS — N186 End stage renal disease: Secondary | ICD-10-CM | POA: Diagnosis not present

## 2018-06-12 DIAGNOSIS — Z992 Dependence on renal dialysis: Secondary | ICD-10-CM | POA: Diagnosis not present

## 2018-06-12 DIAGNOSIS — I871 Compression of vein: Secondary | ICD-10-CM | POA: Diagnosis not present

## 2018-06-12 DIAGNOSIS — T82858A Stenosis of vascular prosthetic devices, implants and grafts, initial encounter: Secondary | ICD-10-CM | POA: Diagnosis not present

## 2018-06-13 DIAGNOSIS — D509 Iron deficiency anemia, unspecified: Secondary | ICD-10-CM | POA: Diagnosis not present

## 2018-06-13 DIAGNOSIS — N186 End stage renal disease: Secondary | ICD-10-CM | POA: Diagnosis not present

## 2018-06-13 DIAGNOSIS — N2581 Secondary hyperparathyroidism of renal origin: Secondary | ICD-10-CM | POA: Diagnosis not present

## 2018-06-13 NOTE — Progress Notes (Deleted)
Cardiology Office Note   Date:  06/13/2018   ID:  Raymond [name], DOB 03/12/1956, MRN [id number]  PCP:  Scot Jun, FNP  Cardiologist:  Dr. Johnsie Cancel    No chief complaint on file.     History of Present Illness: Raymond [name] is a 62 y.o. male who presents for ***  Pt remotely saw Dr. Johnsie Cancel in 2015. For chest pain. EKG at that time with diffuse J point elevation and possible pericarditis. ETT was ordered but never done.   Other hx with ESRD on HD MWF, hepatitis, COPD HTN, thyroid disease   CT angio of chest 12.2018 with multivessel coronary vascular disease Rt atrial enlargement. Cardiomegaly,   Echo 2018 with EF 55-60% G1 DD,Ascending aortic diameter: 42 mm (S).Mildly dilated, PA pk pressure 47 mmHg G1DD Possible small PFO.   On 03/27/18 for pre-op it was felt cardiac cath was needed with angina with exertion.  Cath revealed mild non obstructive CAD, normal LV systolic function with upper normal LV filling pressure.  He was cleared for fem pop baypass.   He no longer does cocaine. + tobacco use we discussed him stopping and rare alcohol.   Pt unable to take BP meds due to hypotension with dialysis. His amlodipine and BB are prn only. Not on statin.  Pt underwent fem pop bypass  But by 04/15/18 he did have chest pain but was found to be in a fib RVR.  Also hypotensive with this.  CHA2DS2VASc of 2.  Chest pain was felt to be due to A fib with non obstructive disease on recent cath.    A fib with RVR amiodarone to 200 mg on 04/25/18  Follow LFTs with hx Hep C, on Warfarin, DCCV if rates poorly controlled after 3 weeks of therapeutic INR.   PAD on atorvastatin. D/c'd 04/20/18   INR followed by not sure it has been checked.  No labs sent from [facility]. Dialysis on [address] is not doing.  INR here is 1.3 and [facility] does follow and sends to their pharmacy group. Coumadin just increased to 3.5 mg daily.   Today pt is in SR. No chest pain and no SOB  no lightheadedness or dizziness.  His BP is low -stated it was low with dialysis.  He does not feel like it is low. Have called [facility] for list of meds.    Pt is a wheel chair.  He does complain of increased pain to Lt upper thigh, no drainage but very tender to touch and warm compared to Rt thigh also increased edema.  Mild erythema as compared to Rt leg.  Decreased amio to200 mg daily.  Coumadin followed by [facility]  He was hypotensive on last visits med decreased.  Past Medical History:  Diagnosis Date  . Anemia   . CAD (coronary artery disease)    a. 03/2018: cath showing 20 to 30% stenosis along the LAD, luminal irregularities along the RCA, and angiographically normal LCx  . COPD (chronic obstructive pulmonary disease) ([place])   . Dyspnea    "with too much fluid"  . ESRD (end stage renal disease) on dialysis [facility])    "MWF; [facility]" (07/22/17)  . Hemodialysis patient ([place])   . Hepatitis C    Still positive s/p liver biopsy at [facility]  and interferon therapy for 6 months. Most recent lab work was on 10/24/12  . Hepatitis C    "took the tx; gone now" (12/05/2016)  Was treated  .  History of blood transfusion ~ 2012/2013   "related to my kidneys; blood was low"  . Hypertension   . Substance abuse ([place])   . Thyroid disease     Past Surgical History:  Procedure Laterality Date  . ABDOMINAL AORTOGRAM W/LOWER EXTREMITY N/A 02/08/2018   Procedure: ABDOMINAL AORTOGRAM W/LOWER EXTREMITY;  Surgeon: Conrad Milton, MD;  Location: [place] CV LAB;  Service: Cardiovascular;  Laterality: N/A;  . AV FISTULA PLACEMENT Left Aug. 2013 ?  . FEMORAL-POPLITEAL BYPASS GRAFT Left 04/11/2018   Procedure: BYPASS GRAFT FEMORAL-POPLITEAL ARTERY LEFT LEG;  Surgeon: Rosetta Posner, MD;  Location: [place];  Service: Vascular;  Laterality: Left;  . LEFT HEART CATH AND CORONARY ANGIOGRAPHY N/A 03/29/2018   Procedure: LEFT HEART CATH AND CORONARY ANGIOGRAPHY;  Surgeon: Nelva Bush, MD;  Location: [place]  CV LAB;  Service: Cardiovascular;  Laterality: N/A;  . LIVER BIOPSY    . ORIF ULNAR FRACTURE Left 05/18/2017   Procedure: OPEN REDUCTION INTERNAL FIXATION (ORIF) ULNAR FRACTURE;  Surgeon: Iran Planas, MD;  Location: [place];  Service: Orthopedics;  Laterality: Left;  . SHUNTOGRAM N/A 12/11/2012   Procedure: Earney Mallet;  Surgeon: Serafina Mitchell, MD;  Location: [facility] CATH LAB;  Service: Cardiovascular;  Laterality: N/A;     Current Outpatient Medications  Medication Sig Dispense Refill  . acetaminophen (TYLENOL) 325 MG tablet Take 650 mg by mouth every 6 (six) hours as needed for mild pain.    [name] albuterol (PROVENTIL HFA;VENTOLIN HFA) 108 (90 Base) MCG/ACT inhaler Inhale 2 puffs into the lungs every 6 (six) hours as needed for wheezing or shortness of breath. (Patient not taking: Reported on 05/17/2018) 1 Inhaler 2  . amiodarone (PACERONE) 200 MG tablet Take 1 tablet (200 mg total) by mouth daily. (Patient not taking: Reported on 05/17/2018) 90 tablet 3  . amLODipine (NORVASC) 2.5 MG tablet Take 1 tablet (2.5 mg total) by mouth daily. (Patient not taking: Reported on 05/17/2018) 90 tablet 3  . aspirin EC 81 MG tablet Take 81 mg by mouth daily.    [name] atorvastatin (LIPITOR) 40 MG tablet Take 1 tablet (40 mg total) by mouth daily. (Patient not taking: Reported on 05/17/2018) 90 tablet 3  . calcitRIOL (ROCALTROL) 0.25 MCG capsule Take 3 capsules (0.75 mcg total) by mouth every Monday, Wednesday, and Friday with hemodialysis. (Patient not taking: Reported on 05/17/2018) 30 capsule 3  . cinacalcet (SENSIPAR) 30 MG tablet Take 30 mg by mouth daily.     . Darbepoetin Alfa (ARANESP) 100 MCG/0.5ML SOSY injection Inject 0.5 mLs (100 mcg total) into the vein every Wednesday with hemodialysis. (Patient not taking: Reported on 05/17/2018) 4.2 mL 3  . gabapentin (NEURONTIN) 300 MG capsule Take 300 mg by mouth at bedtime as needed (sleep).    . metoprolol succinate (TOPROL-XL) 50 MG 24 hr tablet Take 1 tablet (50 mg total) by  mouth daily. Take with or immediately following a meal. (Patient not taking: Reported on 05/17/2018) 90 tablet 3  . multivitamin (RENA-VIT) TABS tablet Take 1 tablet by mouth at bedtime. (Patient not taking: Reported on 05/17/2018) 30 tablet 3  . ondansetron (ZOFRAN) 4 MG tablet Take 1 tablet (4 mg total) by mouth every 8 (eight) hours as needed for nausea or vomiting. (Patient not taking: Reported on 05/17/2018) 20 tablet 0  . oxyCODONE-acetaminophen (PERCOCET/ROXICET) 5-325 MG tablet Take 1 tablet by mouth every 4 (four) hours as needed for moderate pain. (Patient not taking: Reported on 05/17/2018) 20 tablet 0  . pantoprazole (  PROTONIX) 40 MG tablet Take 1 tablet (40 mg total) by mouth daily. (Patient not taking: Reported on 05/17/2018) 30 tablet 0  . sevelamer (RENAGEL) 800 MG tablet Take 4,800 mg by mouth 3 (three) times daily with meals.     . warfarin (COUMADIN) 3 MG tablet Take 1 tablet (3 mg total) by mouth one time only at 6 PM. (Patient not taking: Reported on 05/17/2018) 30 tablet 3   No current facility-administered medications for this visit.    Facility-Administered Medications Ordered in Other Visits  Medication Dose Route Frequency Provider Last Rate Last Dose  . midazolam (VERSED) 5 MG/5ML injection    Anesthesia Intra-op Sammie Bench, CRNA   2 mg at 04/11/18 1042    Allergies:   Patient has no known allergies.    Social History:  The patient  reports that he has been smoking cigarettes. He has a 25.00 pack-year smoking history. He has never used smokeless tobacco. He reports that he drinks alcohol. He reports that he has current or past drug history. Drug: Cocaine.   Family History:  The patient's ***family history includes Diabetes in his father; Heart disease in his father; Hypertension in his father.    ROS:  General:no colds or fevers, no weight changes Skin:no rashes or ulcers HEENT:no blurred vision, no congestion CV:see HPI PUL:see HPI GI:no diarrhea  constipation or melena, no indigestion GU:no hematuria, no dysuria MS:no joint pain, no claudication Neuro:no syncope, no lightheadedness Endo:no diabetes, no thyroid disease Wt Readings from Last 3 Encounters:  05/17/18 155 lb (70.3 kg)  05/15/18 157 lb 12.8 oz (71.6 kg)  05/08/18 163 lb (73.9 kg)     PHYSICAL EXAM: VS:  There were no vitals taken for this visit. , BMI There is no height or weight on file to calculate BMI. General:Pleasant affect, NAD Skin:Warm and dry, brisk capillary refill HEENT:normocephalic, sclera clear, mucus membranes moist Neck:supple, no JVD, no bruits  Heart:S1S2 RRR without murmur, gallup, rub or click Lungs:clear without rales, rhonchi, or wheezes NUU:VOZD, non tender, + BS, do not palpate liver spleen or masses Ext:no lower ext edema, 2+ pedal pulses, 2+ radial pulses Neuro:alert and oriented, MAE, follows commands, + facial symmetry    EKG:  EKG is ordered today. The ekg ordered today demonstrates ***   Recent Labs: 03/31/2018: Magnesium 2.5 04/01/2018: TSH 2.470 04/20/2018: Hemoglobin 7.3; Platelets 184 05/15/2018: ALT 5; BUN 38; Creatinine, Ser 9.13; Potassium 4.4; Sodium 140    Lipid Panel    Component Value Date/Time   CHOL 158 03/27/2018 1420   TRIG 74 03/27/2018 1420   HDL 74 03/27/2018 1420   CHOLHDL 2.1 03/27/2018 1420   CHOLHDL 2.0 Ratio 10/04/2007 2023   VLDL 31 10/04/2007 2023   LDLCALC 69 03/27/2018 1420       Other studies Reviewed: Additional studies/ records that were reviewed today include: ***.   ASSESSMENT AND PLAN:  1.  ***   Current medicines are reviewed with the patient today.  The patient Has no concerns regarding medicines.  The following changes have been made:  See above Labs/ tests ordered today include:see above  Disposition:   FU:  see above  Signed, Cecilie Kicks, NP  06/13/2018 11:03 PM    [address], [place], [address]  [place], [place] Phone: [phone]; Fax: [phone]

## 2018-06-14 ENCOUNTER — Ambulatory Visit: Payer: Self-pay | Admitting: Cardiology

## 2018-06-15 DIAGNOSIS — N186 End stage renal disease: Secondary | ICD-10-CM | POA: Diagnosis not present

## 2018-06-15 DIAGNOSIS — D509 Iron deficiency anemia, unspecified: Secondary | ICD-10-CM | POA: Diagnosis not present

## 2018-06-15 DIAGNOSIS — N2581 Secondary hyperparathyroidism of renal origin: Secondary | ICD-10-CM | POA: Diagnosis not present

## 2018-06-18 DIAGNOSIS — N2581 Secondary hyperparathyroidism of renal origin: Secondary | ICD-10-CM | POA: Diagnosis not present

## 2018-06-18 DIAGNOSIS — N186 End stage renal disease: Secondary | ICD-10-CM | POA: Diagnosis not present

## 2018-06-18 DIAGNOSIS — D509 Iron deficiency anemia, unspecified: Secondary | ICD-10-CM | POA: Diagnosis not present

## 2018-06-19 ENCOUNTER — Encounter: Payer: Self-pay | Admitting: Cardiology

## 2018-06-20 DIAGNOSIS — D509 Iron deficiency anemia, unspecified: Secondary | ICD-10-CM | POA: Diagnosis not present

## 2018-06-20 DIAGNOSIS — N186 End stage renal disease: Secondary | ICD-10-CM | POA: Diagnosis not present

## 2018-06-20 DIAGNOSIS — N2581 Secondary hyperparathyroidism of renal origin: Secondary | ICD-10-CM | POA: Diagnosis not present

## 2018-06-22 DIAGNOSIS — N186 End stage renal disease: Secondary | ICD-10-CM | POA: Diagnosis not present

## 2018-06-22 DIAGNOSIS — N2581 Secondary hyperparathyroidism of renal origin: Secondary | ICD-10-CM | POA: Diagnosis not present

## 2018-06-22 DIAGNOSIS — D509 Iron deficiency anemia, unspecified: Secondary | ICD-10-CM | POA: Diagnosis not present

## 2018-06-27 DIAGNOSIS — N2581 Secondary hyperparathyroidism of renal origin: Secondary | ICD-10-CM | POA: Diagnosis not present

## 2018-06-27 DIAGNOSIS — D509 Iron deficiency anemia, unspecified: Secondary | ICD-10-CM | POA: Diagnosis not present

## 2018-06-27 DIAGNOSIS — N186 End stage renal disease: Secondary | ICD-10-CM | POA: Diagnosis not present

## 2018-06-29 DIAGNOSIS — D509 Iron deficiency anemia, unspecified: Secondary | ICD-10-CM | POA: Diagnosis not present

## 2018-06-29 DIAGNOSIS — N186 End stage renal disease: Secondary | ICD-10-CM | POA: Diagnosis not present

## 2018-06-29 DIAGNOSIS — N2581 Secondary hyperparathyroidism of renal origin: Secondary | ICD-10-CM | POA: Diagnosis not present

## 2018-07-01 DIAGNOSIS — N2581 Secondary hyperparathyroidism of renal origin: Secondary | ICD-10-CM | POA: Diagnosis not present

## 2018-07-01 DIAGNOSIS — N186 End stage renal disease: Secondary | ICD-10-CM | POA: Diagnosis not present

## 2018-07-01 DIAGNOSIS — D509 Iron deficiency anemia, unspecified: Secondary | ICD-10-CM | POA: Diagnosis not present

## 2018-07-08 DIAGNOSIS — N186 End stage renal disease: Secondary | ICD-10-CM | POA: Diagnosis not present

## 2018-07-08 DIAGNOSIS — D509 Iron deficiency anemia, unspecified: Secondary | ICD-10-CM | POA: Diagnosis not present

## 2018-07-08 DIAGNOSIS — N2581 Secondary hyperparathyroidism of renal origin: Secondary | ICD-10-CM | POA: Diagnosis not present

## 2018-07-11 DIAGNOSIS — Z992 Dependence on renal dialysis: Secondary | ICD-10-CM | POA: Diagnosis not present

## 2018-07-11 DIAGNOSIS — N2889 Other specified disorders of kidney and ureter: Secondary | ICD-10-CM | POA: Diagnosis not present

## 2018-07-11 DIAGNOSIS — N186 End stage renal disease: Secondary | ICD-10-CM | POA: Diagnosis not present

## 2018-07-12 ENCOUNTER — Encounter (HOSPITAL_COMMUNITY): Payer: Self-pay

## 2018-07-12 ENCOUNTER — Inpatient Hospital Stay (HOSPITAL_COMMUNITY)
Admission: EM | Admit: 2018-07-12 | Discharge: 2018-07-15 | DRG: 640 | Disposition: A | Discharge status: Home / Self Care | Payer: Medicare Other | Attending: Internal Medicine | Admitting: Internal Medicine

## 2018-07-12 ENCOUNTER — Emergency Department (HOSPITAL_COMMUNITY): Payer: Medicare Other

## 2018-07-12 DIAGNOSIS — Z9119 Patient's noncompliance with other medical treatment and regimen: Secondary | ICD-10-CM

## 2018-07-12 DIAGNOSIS — I739 Peripheral vascular disease, unspecified: Secondary | ICD-10-CM | POA: Diagnosis present

## 2018-07-12 DIAGNOSIS — B1920 Unspecified viral hepatitis C without hepatic coma: Secondary | ICD-10-CM | POA: Diagnosis present

## 2018-07-12 DIAGNOSIS — N368 Other specified disorders of urethra: Secondary | ICD-10-CM

## 2018-07-12 DIAGNOSIS — Z992 Dependence on renal dialysis: Secondary | ICD-10-CM

## 2018-07-12 DIAGNOSIS — I10 Essential (primary) hypertension: Secondary | ICD-10-CM | POA: Diagnosis present

## 2018-07-12 DIAGNOSIS — E875 Hyperkalemia: Secondary | ICD-10-CM | POA: Diagnosis not present

## 2018-07-12 DIAGNOSIS — R0600 Dyspnea, unspecified: Secondary | ICD-10-CM | POA: Diagnosis present

## 2018-07-12 DIAGNOSIS — R0602 Shortness of breath: Secondary | ICD-10-CM | POA: Diagnosis not present

## 2018-07-12 DIAGNOSIS — J449 Chronic obstructive pulmonary disease, unspecified: Secondary | ICD-10-CM | POA: Diagnosis present

## 2018-07-12 DIAGNOSIS — Z8249 Family history of ischemic heart disease and other diseases of the circulatory system: Secondary | ICD-10-CM

## 2018-07-12 DIAGNOSIS — Z79899 Other long term (current) drug therapy: Secondary | ICD-10-CM

## 2018-07-12 DIAGNOSIS — I2510 Atherosclerotic heart disease of native coronary artery without angina pectoris: Secondary | ICD-10-CM | POA: Diagnosis present

## 2018-07-12 DIAGNOSIS — Z833 Family history of diabetes mellitus: Secondary | ICD-10-CM

## 2018-07-12 DIAGNOSIS — I480 Paroxysmal atrial fibrillation: Secondary | ICD-10-CM | POA: Diagnosis present

## 2018-07-12 DIAGNOSIS — Z7901 Long term (current) use of anticoagulants: Secondary | ICD-10-CM

## 2018-07-12 DIAGNOSIS — J9601 Acute respiratory failure with hypoxia: Secondary | ICD-10-CM | POA: Diagnosis not present

## 2018-07-12 DIAGNOSIS — N186 End stage renal disease: Secondary | ICD-10-CM | POA: Diagnosis present

## 2018-07-12 DIAGNOSIS — I120 Hypertensive chronic kidney disease with stage 5 chronic kidney disease or end stage renal disease: Secondary | ICD-10-CM | POA: Diagnosis present

## 2018-07-12 DIAGNOSIS — R0902 Hypoxemia: Secondary | ICD-10-CM | POA: Diagnosis not present

## 2018-07-12 DIAGNOSIS — D631 Anemia in chronic kidney disease: Secondary | ICD-10-CM

## 2018-07-12 DIAGNOSIS — F17210 Nicotine dependence, cigarettes, uncomplicated: Secondary | ICD-10-CM | POA: Diagnosis present

## 2018-07-12 DIAGNOSIS — N451 Epididymitis: Secondary | ICD-10-CM | POA: Diagnosis present

## 2018-07-12 DIAGNOSIS — N189 Chronic kidney disease, unspecified: Secondary | ICD-10-CM

## 2018-07-12 DIAGNOSIS — B1710 Acute hepatitis C without hepatic coma: Secondary | ICD-10-CM | POA: Diagnosis present

## 2018-07-12 DIAGNOSIS — Z7982 Long term (current) use of aspirin: Secondary | ICD-10-CM

## 2018-07-12 DIAGNOSIS — N5082 Scrotal pain: Secondary | ICD-10-CM

## 2018-07-12 DIAGNOSIS — Z9115 Patient's noncompliance with renal dialysis: Secondary | ICD-10-CM

## 2018-07-12 DIAGNOSIS — F1910 Other psychoactive substance abuse, uncomplicated: Secondary | ICD-10-CM | POA: Diagnosis present

## 2018-07-12 IMAGING — DX DG CHEST 1V PORT
1 series · 1 of 1 positions shown · non-contrast
Comparison: [DATE]

CLINICAL DATA: Shortness of breath

EXAM:
PORTABLE CHEST 1 VIEW

[chest ap]
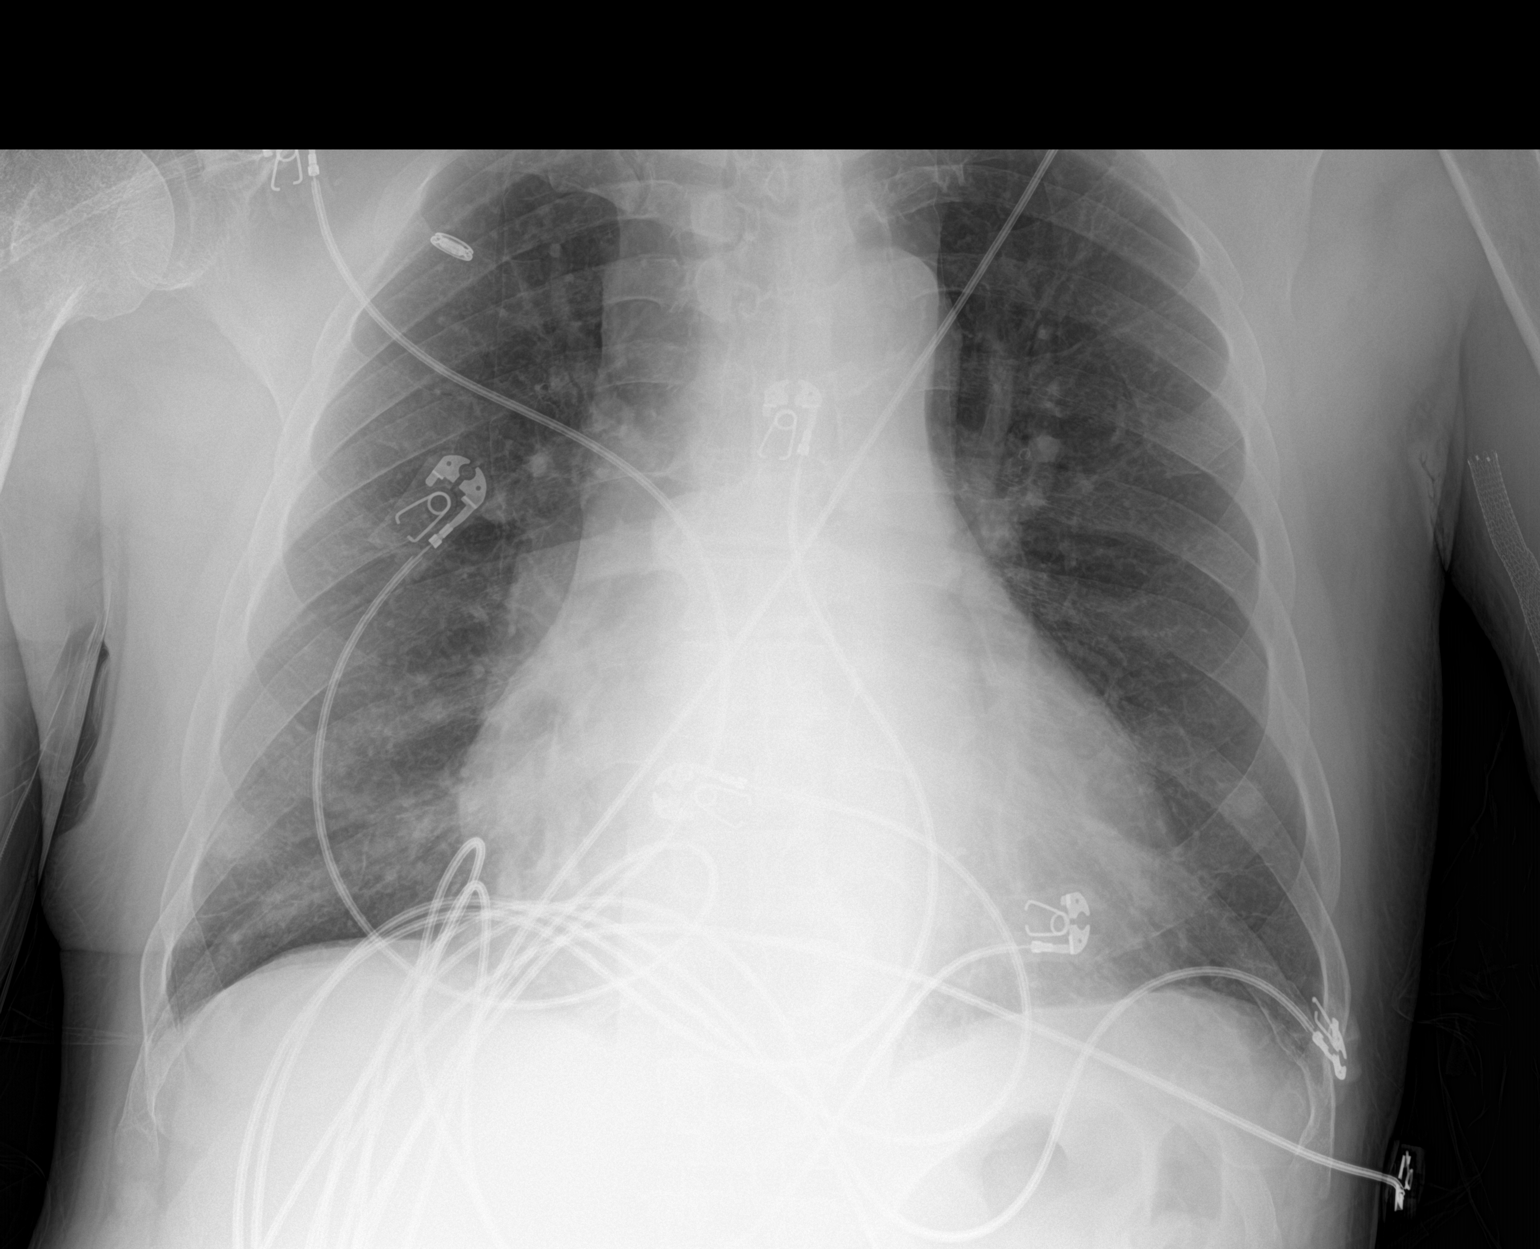

[1 of 1 positions shown; findings below may reference images not displayed]

FINDINGS: Cardiomegaly.  No consolidation or effusion.  No pneumothorax.
IMPRESSION: No active disease.  Cardiomegaly.

## 2018-07-12 NOTE — ED Triage Notes (Signed)
Pt comes via [facility] [place] [facility] [place], has been voiding clots for the past two days, here several months ago and had a surgery to remove a clot, feeling SOB, hx of COPD, initial stat 78% on RA, pt is a dialysis pt, last received on Sunday

## 2018-07-12 NOTE — ED Provider Notes (Signed)
[facility] EMERGENCY DEPARTMENT Provider Note   CSN: [id number] Arrival date & time: 07/12/18  2312     History   Chief Complaint Chief Complaint  Patient presents with  . Shortness of Breath    HPI Raymond [name] is a 63 y.o. male.  The history is provided by the patient and the EMS personnel.  Shortness of Breath   He has history of COPD, hypertension, end-stage renal disease on hemodialysis, peripheral vascular disease, paroxysmal atrial fibrillation and comes in because of bleeding from his penis.  He states that he does not normally make urine, but for the last 2 days he is been passing clots through his penis.  He denies any pain.  EMS noted patient was severely hypertensive and hypoxic with O2 saturation in the 70s.  This improved with oxygen via nonrebreather mask.  Patient did admit to being mildly dyspneic and also to having some vague chest discomfort.  He denied cough, nausea, diaphoresis.  He denies being on any anticoagulants.  He states that he did have some similar bleeding right after surgical procedure 3 months ago, but it had stopped until 2 days ago.  His last dialysis session was 4 days ago.  He missed his scheduled dialysis session 2 days ago.  Past Medical History:  Diagnosis Date  . Anemia   . CAD (coronary artery disease)    a. 03/2018: cath showing 20 to 30% stenosis along the LAD, luminal irregularities along the RCA, and angiographically normal LCx  . COPD (chronic obstructive pulmonary disease) ([place])   . Dyspnea    "with too much fluid"  . ESRD (end stage renal disease) on dialysis [facility])    "MWF; Jeneen Rinks" (07/22/17)  . Hemodialysis patient ([place])   . Hepatitis C    Still positive s/p liver biopsy at [facility]  and interferon therapy for 6 months. Most recent lab work was on 10/24/12  . Hepatitis C    "took the tx; gone now" (12/05/2016)  Was treated  . History of blood transfusion ~ 2012/2013   "related to my kidneys; blood was low"    . Hypertension   . Substance abuse ([place])   . Thyroid disease     Patient Active Problem List   Diagnosis Date Noted  . PAD (peripheral artery disease) ([place]) 04/11/2018  . Sepsis ([place]) 03/31/2018  . Preop cardiovascular exam 03/29/2018  . Volume overload 08/07/2017  . Acute pulmonary edema ([facility])   . Dyspnea 06/20/2017  . Acute bronchitis   . COPD exacerbation ([place])   . End-stage renal disease on hemodialysis ([place])   . Hypoxia 12/05/2016  . Hyperkalemia 12/19/2015  . Hypoglycemia 12/19/2015  . Polysubstance abuse ([place]) 12/19/2015  . Homelessness 12/19/2015  . Anemia associated with stage 5 chronic renal failure ([place]) 12/19/2015  . Atypical chest pain 12/19/2013  . Atherosclerosis of native arteries of extremity with intermittent claudication ([place]) 12/04/2012  . ESRD (end stage renal disease) ([place]) 12/04/2012  . HEPATITIS C 09/07/2006  . HYPERCHOLESTEROLEMIA 09/07/2006  . HYPERTENSION, BENIGN SYSTEMIC 09/07/2006    Past Surgical History:  Procedure Laterality Date  . ABDOMINAL AORTOGRAM W/LOWER EXTREMITY N/A 02/08/2018   Procedure: ABDOMINAL AORTOGRAM W/LOWER EXTREMITY;  Surgeon: Conrad Gurdon, MD;  Location: [facility];  Service: Cardiovascular;  Laterality: N/A;  . AV FISTULA PLACEMENT Left Aug. 2013 ?  . FEMORAL-POPLITEAL BYPASS GRAFT Left 04/11/2018   Procedure: BYPASS GRAFT FEMORAL-POPLITEAL ARTERY LEFT LEG;  Surgeon: Rosetta Posner, MD;  Location:  [facility] OR;  Service: Vascular;  Laterality: Left;  . LEFT HEART CATH AND CORONARY ANGIOGRAPHY N/A 03/29/2018   Procedure: LEFT HEART CATH AND CORONARY ANGIOGRAPHY;  Surgeon: Nelva Bush, MD;  Location: [facility];  Service: Cardiovascular;  Laterality: N/A;  . LIVER BIOPSY    . ORIF ULNAR FRACTURE Left 05/18/2017   Procedure: OPEN REDUCTION INTERNAL FIXATION (ORIF) ULNAR FRACTURE;  Surgeon: Iran Planas, MD;  Location: [facility];  Service: Orthopedics;  Laterality: Left;  . SHUNTOGRAM N/A 12/11/2012   Procedure:  Earney Mallet;  Surgeon: Serafina Mitchell, MD;  Location: [facility] CATH LAB;  Service: Cardiovascular;  Laterality: N/A;        Home Medications    Prior to Admission medications   Medication Sig Start Date End Date Taking? Authorizing Provider  acetaminophen (TYLENOL) 325 MG tablet Take 650 mg by mouth every 6 (six) hours as needed for mild pain.    [provider]  albuterol (PROVENTIL HFA;VENTOLIN HFA) 108 (90 Base) MCG/ACT inhaler Inhale 2 puffs into the lungs every 6 (six) hours as needed for wheezing or shortness of breath. Patient not taking: Reported on 05/17/2018 12/06/16   Lorella Nimrod, MD  amiodarone (PACERONE) 200 MG tablet Take 1 tablet (200 mg total) by mouth daily. Patient not taking: Reported on 05/17/2018 05/15/18   Isaiah Serge, NP  amLODipine (NORVASC) 2.5 MG tablet Take 1 tablet (2.5 mg total) by mouth daily. Patient not taking: Reported on 05/17/2018 05/15/18 08/13/18  Isaiah Serge, NP  aspirin EC 81 MG tablet Take 81 mg by mouth daily.    [provider]  atorvastatin (LIPITOR) 40 MG tablet Take 1 tablet (40 mg total) by mouth daily. Patient not taking: Reported on 05/17/2018 03/27/18 06/25/18  Isaiah Serge, NP  calcitRIOL (ROCALTROL) 0.25 MCG capsule Take 3 capsules (0.75 mcg total) by mouth every Monday, Wednesday, and Friday with hemodialysis. Patient not taking: Reported on 05/17/2018 04/23/18   Waynetta Sandy, MD  cinacalcet (SENSIPAR) 30 MG tablet Take 30 mg by mouth daily.  10/12/15   [provider]  Darbepoetin Alfa (ARANESP) 100 MCG/0.5ML SOSY injection Inject 0.5 mLs (100 mcg total) into the vein every Wednesday with hemodialysis. Patient not taking: Reported on 05/17/2018 04/25/18   Waynetta Sandy, MD  gabapentin (NEURONTIN) 300 MG capsule Take 300 mg by mouth at bedtime as needed (sleep).    [provider]  metoprolol succinate (TOPROL-XL) 50 MG 24 hr tablet Take 1 tablet (50 mg total) by mouth daily. Take with  or immediately following a meal. Patient not taking: Reported on 05/17/2018 05/15/18 08/13/18  Isaiah Serge, NP  multivitamin (RENA-VIT) TABS tablet Take 1 tablet by mouth at bedtime. Patient not taking: Reported on 05/17/2018 04/21/18   Waynetta Sandy, MD  ondansetron (ZOFRAN) 4 MG tablet Take 1 tablet (4 mg total) by mouth every 8 (eight) hours as needed for nausea or vomiting. Patient not taking: Reported on 05/17/2018 04/03/18   Thurnell Lose, MD  oxyCODONE-acetaminophen (PERCOCET/ROXICET) 5-325 MG tablet Take 1 tablet by mouth every 4 (four) hours as needed for moderate pain. Patient not taking: Reported on 05/17/2018 04/20/18   Ulyses Amor, PA-C  pantoprazole (PROTONIX) 40 MG tablet Take 1 tablet (40 mg total) by mouth daily. Patient not taking: Reported on 05/17/2018 04/03/18   Thurnell Lose, MD  sevelamer (RENAGEL) 800 MG tablet Take 4,800 mg by mouth 3 (three) times daily with meals.     [provider]  warfarin (COUMADIN) 3 MG tablet Take 1 tablet (3 mg total) by mouth one time only at 6 PM. Patient not taking: Reported on 05/17/2018 04/21/18   Waynetta Sandy, MD    Family History Family History  Problem Relation Age of Onset  . Diabetes Father   . Hypertension Father   . Heart disease Father     Social History Social History   Tobacco Use  . Smoking status: Current Every Day Smoker    Packs/day: 1.00    Years: 25.00    Pack years: 25.00    Types: Cigarettes    Last attempt to quit: 08/01/2011    Years since quitting: 6.9  . Smokeless tobacco: Never Used  Substance Use Topics  . Alcohol use: Yes    Comment: occasionally  . Drug use: Yes    Types: Cocaine    Comment: clean x approx 2 months (07/22/17)     Allergies   Patient has no known allergies.   Review of Systems Review of Systems  Respiratory: Positive for shortness of breath.   All other systems reviewed and are negative.    Physical Exam Updated Vital Signs BP  (!) 201/118   Pulse 99   Temp 99 F (37.2 C) (Oral)   Resp (!) 24   SpO2 99%   Physical Exam Vitals signs and nursing note reviewed.    63 year old male, resting comfortably and in no acute distress. Vital signs are significant for elevated respiratory rate and blood pressure. Oxygen saturation is 99%, which is normal. Head is normocephalic and atraumatic. PERRLA, EOMI. Oropharynx is clear. Neck is nontender and supple without adenopathy or JVD. Back is nontender and there is no CVA tenderness. Lungs have bibasilar rales, right greater than left.  There are no wheezes or rhonchi. Chest is nontender. Heart has regular rate and rhythm without murmur. Abdomen is soft, flat, nontender without masses or hepatosplenomegaly and peristalsis is normoactive.  Small umbilical hernia is present which is easily reducible. Genitalia: Uncircumcised penis.  Blood is noted on his underpants, but no obvious bleeding from the urethral meatus. Extremities have trace edema, full range of motion is present.  AV fistula is present in the left forearm with thrill present. Skin is warm and dry without rash. Neurologic: Mental status is normal, cranial nerves are intact, there are no motor or sensory deficits.  Follow-up office visits with vascular surgery and cardiology both confirm patient is supposed to be taking warfarin.  Apparently, prior problem with bleeding was with his incision.  I can find no reference to urethral bleeding.  ED Treatments / Results  Labs (all labs ordered are listed, but only abnormal results are displayed) Labs Reviewed  BASIC METABOLIC PANEL - Abnormal; Notable for the following components:      Result Value   Potassium 7.4 (*)    CO2 19 (*)    BUN 97 (*)    Creatinine, Ser 19.89 (*)    Calcium 8.0 (*)    GFR calc non Af Amer 2 (*)    GFR calc Af Amer 2 (*)    Anion gap 18 (*)    All other components within normal limits  CBC WITH DIFFERENTIAL/PLATELET - Abnormal; Notable  for the following components:   RBC 3.12 (*)    Hemoglobin 9.5 (*)    HCT 31.4 (*)    MCV 100.6 (*)    RDW 16.6 (*)    Platelets 101 (*)    All other components  within normal limits  PROTIME-INR - Abnormal; Notable for the following components:   Prothrombin Time 15.8 (*)    All other components within normal limits  URINALYSIS, ROUTINE W REFLEX MICROSCOPIC  I-STAT TROPONIN, ED    EKG EKG Interpretation  Date/Time:  Thursday July 12 2018 23:24:28 EST Ventricular Rate:  95 PR Interval:    QRS Duration: 95 QT Interval:  382 QTC Calculation: 481 R Axis:   42 Text Interpretation:  Sinus rhythm Borderline prolonged PR interval Consider right atrial enlargement Nonspecific T abnrm, anterolateral leads Borderline prolonged QT interval When compared with ECG of 04/15/2018, Sinus rhythm has replaced Atrial fibrillation Confirmed by Delora Fuel ([id number]) on 07/13/2018 12:44:04 AM   Radiology Dg Chest Port 1 View  Result Date: 07/12/2018 CLINICAL DATA:  Shortness of breath EXAM: PORTABLE CHEST 1 VIEW COMPARISON:  04/15/2018 FINDINGS: Cardiomegaly.  No consolidation or effusion.  No pneumothorax. IMPRESSION: No active disease.  Cardiomegaly. Electronically Signed   By: Donavan Foil M.D.   On: 07/12/2018 23:51    Procedures Procedures  CRITICAL CARE Performed by: Delora Fuel Total critical care time: 50 minutes Critical care time was exclusive of separately billable procedures and treating other patients. Critical care was necessary to treat or prevent imminent or life-threatening deterioration. Critical care was time spent personally by me on the following activities: development of treatment plan with patient and/or surrogate as well as nursing, discussions with consultants, evaluation of patient's response to treatment, examination of patient, obtaining history from patient or surrogate, ordering and performing treatments and interventions, ordering and review of laboratory studies,  ordering and review of radiographic studies, pulse oximetry and re-evaluation of patient's condition.  Labs show potassium 7.4.  ECG does not show changes of hyperkalemia, but this is a nonhemolyzed specimen in a dialysis patient who has missed 1 dialysis session and is felt to be real.  He is given aggressive treatment for hyperkalemia with albuterol nebulizer, intravenous calcium, intravenous sodium bicarbonate, intravenous glucose and insulin.  Case is discussed with Dr. Augustin Coupe, on-call for nephrology, who agrees to come and evaluate the patient for emergent dialysis.  We will plan to admit to the hospital following dialysis for evaluation by urology.  Medications Ordered in ED Medications  calcium gluconate inj 10% (1 g) URGENT USE ONLY! (1 g Intravenous Given 07/13/18 0109)  albuterol (PROVENTIL) (2.5 MG/3ML) 0.083% nebulizer solution 10 mg (10 mg Nebulization Given 07/13/18 0102)  insulin aspart (novoLOG) injection 10 Units (10 Units Intravenous Given 07/13/18 0117)    And  dextrose 50 % solution 50 mL (50 mLs Intravenous Given 07/13/18 0119)  sodium bicarbonate injection 50 mEq (50 mEq Intravenous Given 07/13/18 0114)     Initial Impression / Assessment and Plan / ED Course  I have reviewed the triage vital signs and the nursing notes.  Pertinent labs & imaging results that were available during my care of the patient were reviewed by me and considered in my medical decision making (see chart for details).  Blood coming from urethra and patient who is anuric.  Severe hypertension.  Hypoxia with physical findings worrisome for pulmonary edema.  He seems comfortable on supplemental oxygen.  Old records are reviewed, and he had surgery on October 2 for left leg femoral-popliteal bypass with saphenous vein.  Discharge note states that he was to take warfarin.  INR has come back normal.  Potassium is come back markedly elevated at 7.4.  He is given aggressive treatment for hyperkalemia with glucose,  insulin, sodium bicarbonate,  calcium as well as albuterol nebulizer treatment.  Case is discussed with Dr. Augustin Coupe, on-call for nephrology, who agrees to evaluate the patient for emergent dialysis.  Case is discussed with Dr. Tamala Julian of [facility] hospital[facility], who agrees to admit the patient for further evaluation of his urethral bleeding.  Anticipate he will need urology consultation, possible cystoscopy.  Final Clinical Impressions(s) / ED Diagnoses   Final diagnoses:  Hyperkalemia  End-stage renal disease on hemodialysis ([place])  Urethral bleeding  Anemia associated with chronic renal failure    ED Discharge Orders    None       Delora Fuel, MD 15/05[birth year]69 [phone]

## 2018-07-13 DIAGNOSIS — Z992 Dependence on renal dialysis: Secondary | ICD-10-CM | POA: Diagnosis not present

## 2018-07-13 DIAGNOSIS — I2510 Atherosclerotic heart disease of native coronary artery without angina pectoris: Secondary | ICD-10-CM | POA: Diagnosis present

## 2018-07-13 DIAGNOSIS — Z8249 Family history of ischemic heart disease and other diseases of the circulatory system: Secondary | ICD-10-CM | POA: Diagnosis not present

## 2018-07-13 DIAGNOSIS — R0602 Shortness of breath: Secondary | ICD-10-CM | POA: Diagnosis present

## 2018-07-13 DIAGNOSIS — J449 Chronic obstructive pulmonary disease, unspecified: Secondary | ICD-10-CM | POA: Diagnosis present

## 2018-07-13 DIAGNOSIS — D631 Anemia in chronic kidney disease: Secondary | ICD-10-CM | POA: Diagnosis not present

## 2018-07-13 DIAGNOSIS — I480 Paroxysmal atrial fibrillation: Secondary | ICD-10-CM | POA: Diagnosis present

## 2018-07-13 DIAGNOSIS — N250 Renal osteodystrophy: Secondary | ICD-10-CM | POA: Diagnosis not present

## 2018-07-13 DIAGNOSIS — E875 Hyperkalemia: Secondary | ICD-10-CM | POA: Diagnosis not present

## 2018-07-13 DIAGNOSIS — Z9115 Patient's noncompliance with renal dialysis: Secondary | ICD-10-CM

## 2018-07-13 DIAGNOSIS — N451 Epididymitis: Secondary | ICD-10-CM | POA: Diagnosis not present

## 2018-07-13 DIAGNOSIS — Z7901 Long term (current) use of anticoagulants: Secondary | ICD-10-CM | POA: Diagnosis not present

## 2018-07-13 DIAGNOSIS — Z7982 Long term (current) use of aspirin: Secondary | ICD-10-CM | POA: Diagnosis not present

## 2018-07-13 DIAGNOSIS — Z833 Family history of diabetes mellitus: Secondary | ICD-10-CM | POA: Diagnosis not present

## 2018-07-13 DIAGNOSIS — I739 Peripheral vascular disease, unspecified: Secondary | ICD-10-CM | POA: Diagnosis present

## 2018-07-13 DIAGNOSIS — F17210 Nicotine dependence, cigarettes, uncomplicated: Secondary | ICD-10-CM | POA: Diagnosis present

## 2018-07-13 DIAGNOSIS — J9601 Acute respiratory failure with hypoxia: Secondary | ICD-10-CM | POA: Diagnosis not present

## 2018-07-13 DIAGNOSIS — Z79899 Other long term (current) drug therapy: Secondary | ICD-10-CM | POA: Diagnosis not present

## 2018-07-13 DIAGNOSIS — N454 Abscess of epididymis or testis: Secondary | ICD-10-CM | POA: Diagnosis not present

## 2018-07-13 DIAGNOSIS — N368 Other specified disorders of urethra: Secondary | ICD-10-CM | POA: Diagnosis present

## 2018-07-13 DIAGNOSIS — Z9119 Patient's noncompliance with other medical treatment and regimen: Secondary | ICD-10-CM | POA: Diagnosis not present

## 2018-07-13 DIAGNOSIS — B1920 Unspecified viral hepatitis C without hepatic coma: Secondary | ICD-10-CM | POA: Diagnosis present

## 2018-07-13 DIAGNOSIS — I120 Hypertensive chronic kidney disease with stage 5 chronic kidney disease or end stage renal disease: Secondary | ICD-10-CM | POA: Diagnosis present

## 2018-07-13 DIAGNOSIS — I861 Scrotal varices: Secondary | ICD-10-CM | POA: Diagnosis not present

## 2018-07-13 DIAGNOSIS — N186 End stage renal disease: Secondary | ICD-10-CM | POA: Diagnosis not present

## 2018-07-13 LAB — CBC WITH DIFFERENTIAL/PLATELET
Abs Immature Granulocytes: 0.04 10*3/uL (ref 0.00–0.07)
BASOS ABS: 0 10*3/uL (ref 0.0–0.1)
Basophils Relative: 0 %
Eosinophils Absolute: 0 10*3/uL (ref 0.0–0.5)
Eosinophils Relative: 0 %
HCT: 31.4 % — ABNORMAL LOW (ref 39.0–52.0)
Hemoglobin: 9.5 g/dL — ABNORMAL LOW (ref 13.0–17.0)
Immature Granulocytes: 1 %
Lymphocytes Relative: 9 %
Lymphs Abs: 0.7 10*3/uL (ref 0.7–4.0)
MCH: 30.4 pg (ref 26.0–34.0)
MCHC: 30.3 g/dL (ref 30.0–36.0)
MCV: 100.6 fL — ABNORMAL HIGH (ref 80.0–100.0)
MONOS PCT: 11 %
Monocytes Absolute: 0.8 10*3/uL (ref 0.1–1.0)
Neutro Abs: 5.4 10*3/uL (ref 1.7–7.7)
Neutrophils Relative %: 79 %
PLATELETS: 101 10*3/uL — AB (ref 150–400)
RBC: 3.12 MIL/uL — ABNORMAL LOW (ref 4.22–5.81)
RDW: 16.6 % — AB (ref 11.5–15.5)
WBC: 6.9 10*3/uL (ref 4.0–10.5)
nRBC: 0 % (ref 0.0–0.2)

## 2018-07-13 LAB — BASIC METABOLIC PANEL
Anion gap: 18 — ABNORMAL HIGH (ref 5–15)
BUN: 97 mg/dL — ABNORMAL HIGH (ref 8–23)
CALCIUM: 8 mg/dL — AB (ref 8.9–10.3)
CO2: 19 mmol/L — ABNORMAL LOW (ref 22–32)
Chloride: 101 mmol/L (ref 98–111)
Creatinine, Ser: 19.89 mg/dL — ABNORMAL HIGH (ref 0.61–1.24)
GFR calc Af Amer: 2 mL/min — ABNORMAL LOW (ref >60)
GFR calc non Af Amer: 2 mL/min — ABNORMAL LOW (ref >60)
Glucose, Bld: 88 mg/dL (ref 70–99)
Potassium: 7.4 mmol/L (ref 3.5–5.1)
Sodium: 138 mmol/L (ref 135–145)

## 2018-07-13 LAB — PROTIME-INR
INR: 1.28
Prothrombin Time: 15.8 seconds — ABNORMAL HIGH (ref 11.4–15.2)

## 2018-07-13 LAB — I-STAT TROPONIN, ED: Troponin i, poc: 0.05 ng/mL (ref 0.00–0.08)

## 2018-07-13 LAB — POTASSIUM: POTASSIUM: 3.1 mmol/L — AB (ref 3.5–5.1)

## 2018-07-13 MED ORDER — METOPROLOL SUCCINATE ER 50 MG PO TB24
50.0000 mg | ORAL_TABLET | Freq: Every day | ORAL | Status: DC
  Administered 2018-07-13 – 2018-07-15 (×3): 50 mg via ORAL
  Filled 2018-07-13 (×3): qty 1

## 2018-07-13 MED ORDER — INSULIN ASPART 100 UNIT/ML IV SOLN
10.0000 [IU] | Freq: Once | INTRAVENOUS | Status: AC
  Administered 2018-07-13: 10 [IU] via INTRAVENOUS

## 2018-07-13 MED ORDER — ACETAMINOPHEN 325 MG PO TABS
650.0000 mg | ORAL_TABLET | Freq: Four times a day (QID) | ORAL | Status: DC | PRN
  Administered 2018-07-13 – 2018-07-15 (×3): 650 mg via ORAL
  Filled 2018-07-13 (×3): qty 2

## 2018-07-13 MED ORDER — CHLORHEXIDINE GLUCONATE CLOTH 2 % EX PADS
6.0000 | MEDICATED_PAD | Freq: Every day | CUTANEOUS | Status: DC
  Administered 2018-07-13 – 2018-07-15 (×3): 6 via TOPICAL

## 2018-07-13 MED ORDER — SODIUM CHLORIDE 0.9% FLUSH
3.0000 mL | Freq: Two times a day (BID) | INTRAVENOUS | Status: DC
  Administered 2018-07-13 – 2018-07-15 (×4): 3 mL via INTRAVENOUS

## 2018-07-13 MED ORDER — SODIUM CHLORIDE 0.9 % IV SOLN: 250.0000 mL | INTRAVENOUS | Status: DC | PRN

## 2018-07-13 MED ORDER — ACETAMINOPHEN 650 MG RE SUPP: 650.0000 mg | Freq: Four times a day (QID) | RECTAL | Status: DC | PRN

## 2018-07-13 MED ORDER — SODIUM CHLORIDE 0.9% FLUSH: 3.0000 mL | INTRAVENOUS | Status: DC | PRN

## 2018-07-13 MED ORDER — ONDANSETRON HCL 4 MG PO TABS: 4.0000 mg | ORAL_TABLET | Freq: Four times a day (QID) | ORAL | Status: DC | PRN

## 2018-07-13 MED ORDER — AMIODARONE HCL 200 MG PO TABS
200.0000 mg | ORAL_TABLET | Freq: Every day | ORAL | Status: DC
  Administered 2018-07-13 – 2018-07-15 (×3): 200 mg via ORAL
  Filled 2018-07-13 (×3): qty 1

## 2018-07-13 MED ORDER — ALBUTEROL SULFATE (2.5 MG/3ML) 0.083% IN NEBU
10.0000 mg | INHALATION_SOLUTION | Freq: Once | RESPIRATORY_TRACT | Status: AC
  Administered 2018-07-13: 10 mg via RESPIRATORY_TRACT
  Filled 2018-07-13: qty 12

## 2018-07-13 MED ORDER — DILTIAZEM HCL 25 MG/5ML IV SOLN
10.0000 mg | Freq: Once | INTRAVENOUS | Status: AC
  Administered 2018-07-13: 10 mg via INTRAVENOUS
  Filled 2018-07-13: qty 5

## 2018-07-13 MED ORDER — CALCIUM GLUCONATE 10 % IV SOLN
1.0000 g | Freq: Once | INTRAVENOUS | Status: AC
  Administered 2018-07-13: 1 g via INTRAVENOUS
  Filled 2018-07-13: qty 10

## 2018-07-13 MED ORDER — ONDANSETRON HCL 4 MG/2ML IJ SOLN: 4.0000 mg | Freq: Four times a day (QID) | INTRAMUSCULAR | Status: DC | PRN

## 2018-07-13 MED ORDER — DEXTROSE 50 % IV SOLN
1.0000 | Freq: Once | INTRAVENOUS | Status: AC
  Administered 2018-07-13: 50 mL via INTRAVENOUS
  Filled 2018-07-13: qty 50

## 2018-07-13 MED ORDER — SODIUM BICARBONATE 8.4 % IV SOLN
50.0000 meq | Freq: Once | INTRAVENOUS | Status: AC
  Administered 2018-07-13: 50 meq via INTRAVENOUS
  Filled 2018-07-13: qty 50

## 2018-07-13 NOTE — Procedures (Signed)
I was present at this dialysis session. I have reviewed the session itself and made appropriate changes.   AVF AP ok, 1K bath x 2, then 2K. UF goal 4L. BP remain up, follow with tx. Reassess might require additional Tx tomorrow.    Filed Weights    Recent Labs  Lab 07/12/18 2358  NA 138  K 7.4*  CL 101  CO2 19*  GLUCOSE 88  BUN 97*  CREATININE 19.89*  CALCIUM 8.0*    Recent Labs  Lab 07/12/18 2358  WBC 6.9  NEUTROABS 5.4  HGB 9.5*  HCT 31.4*  MCV 100.6*  PLT 101*    Scheduled Meds: . Chlorhexidine Gluconate Cloth  6 each Topical Q0600  . sodium chloride flush  3 mL Intravenous Q12H   Continuous Infusions: . sodium chloride     PRN Meds:.sodium chloride, acetaminophen **OR** acetaminophen, ondansetron **OR** ondansetron (ZOFRAN) IV, sodium chloride flush   Pearson Grippe  MD 07/13/2018, 8:45 AM

## 2018-07-13 NOTE — Consult Note (Signed)
Reason for Consult: ESRD Referring Physician:  Dr. Roxanne Mins  Chief Complaint:  Hematuria  Dialysis orders: [facility]  4hr MWF   Lt [place] EDW 72kg last HD 12/29 and left after 2hr81min at 75kg 2/2.5   Heparin 2000 units Parsabiv 10 qtx    Mirc 30q2weeks (note of receiving 225 06/05/18) Renvela 5 tabs TIDM  Rocaltrol 1.25 TIW   Assessment/Plan: 1. ESRD - Very noncompliant pt with only treatments on past 2 Sundays, o/w has missed all other treatments and is well above his EDW. 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  - Will give Hectorol while in the hospital (Ca8 w/ Alb 3.7). Recheck Phos. 3. Anemia - @ goal  4. Hematuria - send urinalysis and culture when sample is available. Sexually active as well. 5. CASHD 6. HCV - IFN from [facility]. 7. COPD 8. PAD     HPI: Raymond [name] is an 63 y.o. male COPD HTN PAD pAfib HCV CASHD ESRD MWF @ [facility] with Dr. Jimmy Footman. He is noncompliant with his dialysis treatments and his last 2 treatments were on 12/22 and then 12/29 with both treatments 2:44 and 3:07 duration, respectively. He left 3 kg above his EDW of 72 kg after the last treatment. He doesn't make urine but for the past 2 days has passed clots from the penis with associated discomfort but denies fevers; pain in the left groin area where there is an old surgical scar. He states that he had blood clots from the penis 3 mths ago when he had a cardiac cath. He also denies f/c/n/cough.  ROS Pertinent items are noted in HPI.  Chemistry and CBC: Creatinine, Ser  Date/Time Value Ref Range Status  07/12/2018 11:58 PM 19.89 (H) 0.61 - 1.24 mg/dL Final  05/15/2018 04:32 PM 9.13 (H) 0.76 - 1.27 mg/dL Final  04/20/2018 09:24 AM 9.51 (H) 0.61 - 1.24 mg/dL Final  04/18/2018 08:00 AM 10.50 (H) 0.61 - 1.24 mg/dL Final  04/16/2018 07:49 AM 12.75 (H) 0.61 - 1.24 mg/dL Final  04/15/2018 07:27 AM 10.41 (H) 0.61 - 1.24 mg/dL Final  04/13/2018 03:38 AM 9.24 (H) 0.61  - 1.24 mg/dL Final  04/12/2018 03:46 AM 13.79 (H) 0.61 - 1.24 mg/dL Final  04/11/2018 08:35 AM 12.09 (H) 0.61 - 1.24 mg/dL Final  04/02/2018 04:46 AM 10.88 (H) 0.61 - 1.24 mg/dL Final  04/01/2018 05:51 AM 7.52 (H) 0.61 - 1.24 mg/dL Final    Comment:    DELTA CHECK NOTED  03/31/2018 10:50 AM 13.90 (H) 0.61 - 1.24 mg/dL Final  03/31/2018 09:18 AM 15.10 (H) 0.61 - 1.24 mg/dL Final  03/29/2018 08:02 AM 9.77 (H) 0.61 - 1.24 mg/dL Final  03/27/2018 02:20 PM 13.23 (H) 0.76 - 1.27 mg/dL Final    Comment:    **Verified by repeat analysis**  02/08/2018 08:03 AM 7.60 (H) 0.61 - 1.24 mg/dL Final  02/06/2018 08:23 AM 8.40 (H) 0.61 - 1.24 mg/dL Final  08/07/2017 06:04 PM 9.26 (H) 0.61 - 1.24 mg/dL Final  08/07/2017 12:29 PM 7.90 (H) 0.61 - 1.24 mg/dL Final  07/13/2017 12:00 AM 10.62 (H) 0.76 - 1.27 mg/dL Final  06/22/2017 08:01 AM 9.06 (H) 0.61 - 1.24 mg/dL Final  06/21/2017 04:51 AM 11.39 (H) 0.61 - 1.24 mg/dL Final    Comment:    DELTA CHECK NOTED  06/21/2017 04:51 AM 11.27 (H) 0.61 - 1.24 mg/dL Final  06/20/2017 07:24 AM 24.03 (H) 0.61 - 1.24 mg/dL Final  06/11/2017 11:51 PM 16.90 (H) 0.61 -  1.24 mg/dL Final  05/31/2017 08:52 PM 12.30 (H) 0.61 - 1.24 mg/dL Final  05/19/2017 07:48 PM 7.81 (H) 0.61 - 1.24 mg/dL Final  05/17/2017 03:31 PM 12.84 (H) 0.61 - 1.24 mg/dL Final  04/28/2017 01:40 PM 5.57 (H) 0.61 - 1.24 mg/dL Final  04/08/2017 11:55 PM 11.24 (H) 0.61 - 1.24 mg/dL Final  12/06/2016 05:49 AM 8.59 (H) 0.61 - 1.24 mg/dL Final    Comment:    DELTA CHECK NOTED  12/05/2016 04:06 AM 13.79 (H) 0.61 - 1.24 mg/dL Final  12/21/2015 04:25 AM 12.41 (H) 0.61 - 1.24 mg/dL Final    Comment:    DELTA CHECK NOTED  12/19/2015 05:59 AM 20.49 (H) 0.61 - 1.24 mg/dL Final  12/19/2015 12:10 AM >18.00 (H) 0.61 - 1.24 mg/dL Final  12/18/2015 10:18 PM >18.00 (H) 0.61 - 1.24 mg/dL Final  12/18/2015 10:10 PM 20.44 (H) 0.61 - 1.24 mg/dL Final  03/09/2013 03:32 AM 11.50 (H) 0.50 - 1.35 mg/dL Final  03/09/2013  03:14 AM 10.12 (H) 0.50 - 1.35 mg/dL Final  12/11/2012 07:29 AM 5.80 (H) 0.50 - 1.35 mg/dL Final  10/04/2010 08:06 PM 3.91 (H) 0.40 - 1.50 mg/dL Final    Comment:    See lab report for associated comment(s)  05/16/2010 05:05 AM 2.97 (H) 0.4 - 1.5 mg/dL Final  05/13/2010 05:30 AM 3.27 (H) 0.4 - 1.5 mg/dL Final  05/11/2010 03:57 AM 3.38 (H) 0.4 - 1.5 mg/dL Final  05/10/2010 10:06 AM 3.16 (H) 0.4 - 1.5 mg/dL Final  05/09/2010 03:43 PM 3.10 (H) 0.4 - 1.5 mg/dL Final  05/08/2010 11:47 PM 3.02 (H) 0.4 - 1.5 mg/dL Final  05/08/2010 10:57 AM 3.31 (H) 0.4 - 1.5 mg/dL Final  05/08/2010 04:05 AM 3.42 (H) 0.4 - 1.5 mg/dL Final  05/07/2010 11:10 AM 3.01 (H) 0.4 - 1.5 mg/dL Final  05/07/2010 10:54 AM 3.1 (H) 0.4 - 1.5 mg/dL Final  11/19/2007 11:20 PM 0.97 0.40 - 1.50 mg/dL Final   Recent Labs  Lab 07/12/18 2358  NA 138  K 7.4*  CL 101  CO2 19*  GLUCOSE 88  BUN 97*  CREATININE 19.89*  CALCIUM 8.0*   Recent Labs  Lab 07/12/18 2358  WBC 6.9  NEUTROABS 5.4  HGB 9.5*  HCT 31.4*  MCV 100.6*  PLT 101*   Liver Function Tests: No results for input(s): AST, ALT, ALKPHOS, BILITOT, PROT, ALBUMIN in the last 168 hours. No results for input(s): LIPASE, AMYLASE in the last 168 hours. No results for input(s): AMMONIA in the last 168 hours. Cardiac Enzymes: No results for input(s): CKTOTAL, CKMB, CKMBINDEX, TROPONINI in the last 168 hours. Iron Studies: No results for input(s): IRON, TIBC, TRANSFERRIN, FERRITIN in the last 72 hours. PT/INR: @LABRCNTIP (inr:5)  Xrays/Other Studies: ) Results for orders placed or performed during the hospital encounter of 07/12/18 (from the past 48 hour(s))  Basic metabolic panel     Status: Abnormal   Collection Time: 07/12/18 11:58 PM  Result Value Ref Range   Sodium 138 135 - 145 mmol/L   Potassium 7.4 (HH) 3.5 - 5.1 mmol/L    Comment: NO VISIBLE HEMOLYSIS CRITICAL RESULT CALLED TO, READ BACK BY AND VERIFIED WITH: FERANELO J,RN 07/13/18 0042 WAYK     Chloride 101 98 - 111 mmol/L   CO2 19 (L) 22 - 32 mmol/L   Glucose, Bld 88 70 - 99 mg/dL   BUN 97 (H) 8 - 23 mg/dL   Creatinine, Ser 19.89 (H) 0.61 - 1.24 mg/dL   Calcium 8.0 (L) 8.9 -  10.3 mg/dL   GFR calc non Af Amer 2 (L) >60 mL/min   GFR calc Af Amer 2 (L) >60 mL/min   Anion gap 18 (H) 5 - 15    Comment: Performed at [facility] [address], [place], [place] [address]  CBC with Differential     Status: Abnormal   Collection Time: 07/12/18 11:58 PM  Result Value Ref Range   WBC 6.9 4.0 - 10.5 K/uL   RBC 3.12 (L) 4.22 - 5.81 MIL/uL   Hemoglobin 9.5 (L) 13.0 - 17.0 g/dL   HCT 31.4 (L) 39.0 - 52.0 %   MCV 100.6 (H) 80.0 - 100.0 fL   MCH 30.4 26.0 - 34.0 pg   MCHC 30.3 30.0 - 36.0 g/dL   RDW 16.6 (H) 11.5 - 15.5 %   Platelets 101 (L) 150 - 400 K/uL    Comment: REPEATED TO VERIFY PLATELET COUNT CONFIRMED BY SMEAR Immature Platelet Fraction may be clinically indicated, consider ordering this additional test [id number]    nRBC 0.0 0.0 - 0.2 %   Neutrophils Relative % 79 %   Neutro Abs 5.4 1.7 - 7.7 K/uL   Lymphocytes Relative 9 %   Lymphs Abs 0.7 0.7 - 4.0 K/uL   Monocytes Relative 11 %   Monocytes Absolute 0.8 0.1 - 1.0 K/uL   Eosinophils Relative 0 %   Eosinophils Absolute 0.0 0.0 - 0.5 K/uL   Basophils Relative 0 %   Basophils Absolute 0.0 0.0 - 0.1 K/uL   Immature Granulocytes 1 %   Abs Immature Granulocytes 0.04 0.00 - 0.07 K/uL    Comment: Performed at [facility] [address] [place], [place] [address]  Protime-INR     Status: Abnormal   Collection Time: 07/12/18 11:58 PM  Result Value Ref Range   Prothrombin Time 15.8 (H) 11.4 - 15.2 seconds   INR 1.28     Comment: Performed at [facility] [address], [place], [place] [address]  I-stat troponin, ED     Status: None   Collection Time: 07/13/18 12:01 AM  Result Value Ref Range   Troponin i, poc 0.05 0.00 - 0.08 ng/mL   Comment 3            Comment: Due to the release kinetics of  cTnI, a negative result within the first hours of the onset of symptoms does not rule out myocardial infarction with certainty. If myocardial infarction is still suspected, repeat the test at appropriate intervals.    Dg Chest Port 1 View  Result Date: 07/12/2018 CLINICAL DATA:  Shortness of breath EXAM: PORTABLE CHEST 1 VIEW COMPARISON:  04/15/2018 FINDINGS: Cardiomegaly.  No consolidation or effusion.  No pneumothorax. IMPRESSION: No active disease.  Cardiomegaly. Electronically Signed   By: Donavan Foil M.D.   On: 07/12/2018 23:51    PMH:   Past Medical History:  Diagnosis Date  . Anemia   . CAD (coronary artery disease)    a. 03/2018: cath showing 20 to 30% stenosis along the LAD, luminal irregularities along the RCA, and angiographically normal LCx  . COPD (chronic obstructive pulmonary disease) ([place])   . Dyspnea    "with too much fluid"  . ESRD (end stage renal disease) on dialysis [facility])    "MWF; [facility]" (07/22/17)  . Hemodialysis patient ([place])   . Hepatitis C    Still positive s/p liver biopsy at [facility]  and interferon therapy for 6 months. Most recent lab work  was on 10/24/12  . Hepatitis C    "took the tx; gone now" (12/05/2016)  Was treated  . History of blood transfusion ~ 2012/2013   "related to my kidneys; blood was low"  . Hypertension   . Substance abuse ([place])   . Thyroid disease     PSH:   Past Surgical History:  Procedure Laterality Date  . ABDOMINAL AORTOGRAM W/LOWER EXTREMITY N/A 02/08/2018   Procedure: ABDOMINAL AORTOGRAM W/LOWER EXTREMITY;  Surgeon: Conrad Jamestown, MD;  Location: [facility] CV LAB;  Service: Cardiovascular;  Laterality: N/A;  . AV FISTULA PLACEMENT Left Aug. 2013 ?  . FEMORAL-POPLITEAL BYPASS GRAFT Left 04/11/2018   Procedure: BYPASS GRAFT FEMORAL-POPLITEAL ARTERY LEFT LEG;  Surgeon: Rosetta Posner, MD;  Location: [facility];  Service: Vascular;  Laterality: Left;  . LEFT HEART CATH AND CORONARY ANGIOGRAPHY N/A 03/29/2018   Procedure: LEFT  HEART CATH AND CORONARY ANGIOGRAPHY;  Surgeon: Nelva Bush, MD;  Location: [facility] CV LAB;  Service: Cardiovascular;  Laterality: N/A;  . LIVER BIOPSY    . ORIF ULNAR FRACTURE Left 05/18/2017   Procedure: OPEN REDUCTION INTERNAL FIXATION (ORIF) ULNAR FRACTURE;  Surgeon: Iran Planas, MD;  Location: [facility];  Service: Orthopedics;  Laterality: Left;  . SHUNTOGRAM N/A 12/11/2012   Procedure: Earney Mallet;  Surgeon: Serafina Mitchell, MD;  Location: [facility] CATH LAB;  Service: Cardiovascular;  Laterality: N/A;    Allergies: No Known Allergies  Medications:   Prior to Admission medications   Medication Sig Start Date End Date Taking? Authorizing Provider  acetaminophen (TYLENOL) 325 MG tablet Take 650 mg by mouth every 6 (six) hours as needed for mild pain.    [provider]  albuterol (PROVENTIL HFA;VENTOLIN HFA) 108 (90 Base) MCG/ACT inhaler Inhale 2 puffs into the lungs every 6 (six) hours as needed for wheezing or shortness of breath. Patient not taking: Reported on 05/17/2018 12/06/16   Lorella Nimrod, MD  amiodarone (PACERONE) 200 MG tablet Take 1 tablet (200 mg total) by mouth daily. Patient not taking: Reported on 05/17/2018 05/15/18   Isaiah Serge, NP  amLODipine (NORVASC) 2.5 MG tablet Take 1 tablet (2.5 mg total) by mouth daily. Patient not taking: Reported on 05/17/2018 05/15/18 08/13/18  Isaiah Serge, NP  aspirin EC 81 MG tablet Take 81 mg by mouth daily.    [provider]  atorvastatin (LIPITOR) 40 MG tablet Take 1 tablet (40 mg total) by mouth daily. Patient not taking: Reported on 05/17/2018 03/27/18 06/25/18  Isaiah Serge, NP  calcitRIOL (ROCALTROL) 0.25 MCG capsule Take 3 capsules (0.75 mcg total) by mouth every Monday, Wednesday, and Friday with hemodialysis. Patient not taking: Reported on 05/17/2018 04/23/18   Waynetta Sandy, MD  cinacalcet (SENSIPAR) 30 MG tablet Take 30 mg by mouth daily.  10/12/15   [provider]  Darbepoetin Alfa  (ARANESP) 100 MCG/0.5ML SOSY injection Inject 0.5 mLs (100 mcg total) into the vein every Wednesday with hemodialysis. Patient not taking: Reported on 05/17/2018 04/25/18   Waynetta Sandy, MD  gabapentin (NEURONTIN) 300 MG capsule Take 300 mg by mouth at bedtime as needed (sleep).    [provider]  metoprolol succinate (TOPROL-XL) 50 MG 24 hr tablet Take 1 tablet (50 mg total) by mouth daily. Take with or immediately following a meal. Patient not taking: Reported on 05/17/2018 05/15/18 08/13/18  Isaiah Serge, NP  multivitamin (RENA-VIT) TABS tablet Take 1 tablet by mouth at bedtime. Patient not taking: Reported on 05/17/2018 04/21/18  Waynetta Sandy, MD  ondansetron (ZOFRAN) 4 MG tablet Take 1 tablet (4 mg total) by mouth every 8 (eight) hours as needed for nausea or vomiting. Patient not taking: Reported on 05/17/2018 04/03/18   Thurnell Lose, MD  oxyCODONE-acetaminophen (PERCOCET/ROXICET) 5-325 MG tablet Take 1 tablet by mouth every 4 (four) hours as needed for moderate pain. Patient not taking: Reported on 05/17/2018 04/20/18   Ulyses Amor, PA-C  pantoprazole (PROTONIX) 40 MG tablet Take 1 tablet (40 mg total) by mouth daily. Patient not taking: Reported on 05/17/2018 04/03/18   Thurnell Lose, MD  sevelamer (RENAGEL) 800 MG tablet Take 4,800 mg by mouth 3 (three) times daily with meals.     [provider]  warfarin (COUMADIN) 3 MG tablet Take 1 tablet (3 mg total) by mouth one time only at 6 PM. Patient not taking: Reported on 05/17/2018 04/21/18   Waynetta Sandy, MD    Discontinued Meds:  There are no discontinued medications.  Social History:  reports that he has been smoking cigarettes. He has a 25.00 pack-year smoking history. He has never used smokeless tobacco. He reports current alcohol use. He reports current drug use. Drug: Cocaine.  Family History:   Family History  Problem Relation Age of Onset  . Diabetes Father   .  Hypertension Father   . Heart disease Father     Blood pressure (!) 201/118, pulse 87, temperature 99 F (37.2 C), temperature source Oral, resp. rate 17, SpO2 100 %. General appearance: alert, cooperative and appears stated age Head: Normocephalic, without obvious abnormality, atraumatic Eyes: negative Neck: no adenopathy, no carotid bruit, supple, symmetrical, trachea midline and thyroid not enlarged, symmetric, no tenderness/mass/nodules Back: symmetric, no curvature. ROM normal. No CVA tenderness. Resp: rales bilaterally Chest wall: no tenderness Cardio: regular rate and rhythm, S1, S2 normal, no murmur, click, rub or gallop GI: soft, non-tender; bowel sounds normal; no masses,  no organomegaly Extremities: edema 1+ Pulses: 2+ and symmetric Skin: Skin color, texture, turgor normal. No rashes or lesions Lymph nodes: Cervical, supraclavicular, and axillary nodes normal. Neurologic: Grossly normal       Djon Tith, Hunt Oris, MD 07/13/2018, 2:06 AM

## 2018-07-13 NOTE — Progress Notes (Signed)
Inaccurate reading

## 2018-07-13 NOTE — Progress Notes (Signed)
Paged the hospitalist admitting service about pt's heart rate being elevated.  I also sent a secure chat to the admitting md.

## 2018-07-13 NOTE — ED Notes (Signed)
Nephrology at bedside

## 2018-07-13 NOTE — H&P (Signed)
History and Physical    Raymond [name] NWG:[id number] DOB: February 01, 1956 DOA: 07/12/2018  PCP: Bing Neighbors, FNP  Patient coming from: Home  Chief Complaint: Shortness of breath  HPI: Raymond [name] is a 63 y.o. male with medical history significant of end-stage renal disease very noncompliant with his dialysis, drug abuse, COPD, hepatitis C comes in with shortness of breath and feeling weak.  He was also noted to have some hematuria.  Patient denies any fevers or cough.  He denies any significant swelling.  He also had reported to me that he is compliant with his dialysis however chart review points to some different.  Patient was found to be hypoxic with O2 sat of 78% on arrival on room air.  His potassium level was 7.5 and required emergent dialysis that was started last night.  Patient is being referred for admission for acute hypoxic respiratory failure secondary to volume overload due to dialysis noncompliance.  Review of Systems: As per HPI otherwise 10 point review of systems negative.   Past Medical History:  Diagnosis Date  . Anemia   . CAD (coronary artery disease)    a. 03/2018: cath showing 20 to 30% stenosis along the LAD, luminal irregularities along the RCA, and angiographically normal LCx  . COPD (chronic obstructive pulmonary disease) (HCC)   . Dyspnea    "with too much fluid"  . ESRD (end stage renal disease) on dialysis [facility])    "MWF; [facility]" (07/22/17)  . Hemodialysis patient (HCC)   . Hepatitis C    Still positive s/p liver biopsy at [facility]  and interferon therapy for 6 months. Most recent lab work was on 10/24/12  . Hepatitis C    "took the tx; gone now" (12/05/2016)  Was treated  . History of blood transfusion ~ 2012/2013   "related to my kidneys; blood was low"  . Hypertension   . Substance abuse (HCC)   . Thyroid disease     Past Surgical History:  Procedure Laterality Date  . ABDOMINAL AORTOGRAM W/LOWER EXTREMITY N/A 02/08/2018   Procedure: ABDOMINAL  AORTOGRAM W/LOWER EXTREMITY;  Surgeon: Fransisco Hertz, MD;  Location: [facility] INVASIVE CV LAB;  Service: Cardiovascular;  Laterality: N/A;  . AV FISTULA PLACEMENT Left Aug. 2013 ?  . FEMORAL-POPLITEAL BYPASS GRAFT Left 04/11/2018   Procedure: BYPASS GRAFT FEMORAL-POPLITEAL ARTERY LEFT LEG;  Surgeon: Larina Earthly, MD;  Location: [facility] OR;  Service: Vascular;  Laterality: Left;  . LEFT HEART CATH AND CORONARY ANGIOGRAPHY N/A 03/29/2018   Procedure: LEFT HEART CATH AND CORONARY ANGIOGRAPHY;  Surgeon: Yvonne Kendall, MD;  Location: [facility] INVASIVE CV LAB;  Service: Cardiovascular;  Laterality: N/A;  . LIVER BIOPSY    . ORIF ULNAR FRACTURE Left 05/18/2017   Procedure: OPEN REDUCTION INTERNAL FIXATION (ORIF) ULNAR FRACTURE;  Surgeon: Bradly Bienenstock, MD;  Location: [facility] OR;  Service: Orthopedics;  Laterality: Left;  . SHUNTOGRAM N/A 12/11/2012   Procedure: Betsey Amen;  Surgeon: Nada Libman, MD;  Location: [facility] CATH LAB;  Service: Cardiovascular;  Laterality: N/A;     reports that he has been smoking cigarettes. He has a 25.00 pack-year smoking history. He has never used smokeless tobacco. He reports current alcohol use. He reports current drug use. Drug: Cocaine.  No Known Allergies  Family History  Problem Relation Age of Onset  . Diabetes Father   . Hypertension Father   . Heart disease Father     Prior to Admission medications   Medication Sig Start Date End  Date Taking? Authorizing Provider  acetaminophen (TYLENOL) 325 MG tablet Take 650 mg by mouth every 6 (six) hours as needed for mild pain.    [provider]  albuterol (PROVENTIL HFA;VENTOLIN HFA) 108 (90 Base) MCG/ACT inhaler Inhale 2 puffs into the lungs every 6 (six) hours as needed for wheezing or shortness of breath. Patient not taking: Reported on 05/17/2018 12/06/16   Arnetha Courser, MD  amiodarone (PACERONE) 200 MG tablet Take 1 tablet (200 mg total) by mouth daily. Patient not taking: Reported on 05/17/2018 05/15/18   Leone Brand, NP    amLODipine (NORVASC) 2.5 MG tablet Take 1 tablet (2.5 mg total) by mouth daily. Patient not taking: Reported on 05/17/2018 05/15/18 08/13/18  Leone Brand, NP  aspirin EC 81 MG tablet Take 81 mg by mouth daily.    [provider]  atorvastatin (LIPITOR) 40 MG tablet Take 1 tablet (40 mg total) by mouth daily. Patient not taking: Reported on 05/17/2018 03/27/18 06/25/18  Leone Brand, NP  calcitRIOL (ROCALTROL) 0.25 MCG capsule Take 3 capsules (0.75 mcg total) by mouth every Monday, Wednesday, and Friday with hemodialysis. Patient not taking: Reported on 05/17/2018 04/23/18   Maeola Harman, MD  cinacalcet (SENSIPAR) 30 MG tablet Take 30 mg by mouth daily.  10/12/15   [provider]  Darbepoetin Alfa (ARANESP) 100 MCG/0.5ML SOSY injection Inject 0.5 mLs (100 mcg total) into the vein every Wednesday with hemodialysis. Patient not taking: Reported on 05/17/2018 04/25/18   Maeola Harman, MD  gabapentin (NEURONTIN) 300 MG capsule Take 300 mg by mouth at bedtime as needed (sleep).    [provider]  metoprolol succinate (TOPROL-XL) 50 MG 24 hr tablet Take 1 tablet (50 mg total) by mouth daily. Take with or immediately following a meal. Patient not taking: Reported on 05/17/2018 05/15/18 08/13/18  Leone Brand, NP  multivitamin (RENA-VIT) TABS tablet Take 1 tablet by mouth at bedtime. Patient not taking: Reported on 05/17/2018 04/21/18   Maeola Harman, MD  ondansetron (ZOFRAN) 4 MG tablet Take 1 tablet (4 mg total) by mouth every 8 (eight) hours as needed for nausea or vomiting. Patient not taking: Reported on 05/17/2018 04/03/18   Leroy Sea, MD  oxyCODONE-acetaminophen (PERCOCET/ROXICET) 5-325 MG tablet Take 1 tablet by mouth every 4 (four) hours as needed for moderate pain. Patient not taking: Reported on 05/17/2018 04/20/18   Lars Mage, PA-C  pantoprazole (PROTONIX) 40 MG tablet Take 1 tablet (40 mg total) by mouth daily. Patient  not taking: Reported on 05/17/2018 04/03/18   Leroy Sea, MD  sevelamer (RENAGEL) 800 MG tablet Take 4,800 mg by mouth 3 (three) times daily with meals.     [provider]  warfarin (COUMADIN) 3 MG tablet Take 1 tablet (3 mg total) by mouth one time only at 6 PM. Patient not taking: Reported on 05/17/2018 04/21/18   Maeola Harman, MD    Physical Exam: Vitals:   07/13/18 0500 07/13/18 0530 07/13/18 0615 07/13/18 0631  BP: 132/86 (!) 156/99 (!) 155/92 (!) 155/105  Pulse: 85 (!) 128 83 61  Resp: 14 14 (!) 22 16  Temp:    98.1 F (36.7 C)  TempSrc:    Oral  SpO2: 100% 92% 97% 98%      Constitutional: NAD, calm, comfortable Vitals:   07/13/18 0500 07/13/18 0530 07/13/18 0615 07/13/18 0631  BP: 132/86 (!) 156/99 (!) 155/92 (!) 155/105  Pulse: 85 (!) 128 83 61  Resp:  14 14 (!) 22 16  Temp:    98.1 F (36.7 C)  TempSrc:    Oral  SpO2: 100% 92% 97% 98%   Eyes: PERRL, lids and conjunctivae normal ENMT: Mucous membranes are moist. Posterior pharynx clear of any exudate or lesions.Normal dentition.  Neck: normal, supple, no masses, no thyromegaly Respiratory: clear to auscultation bilaterally, no wheezing, no crackles. Normal respiratory effort. No accessory muscle use.  Cardiovascular: Regular rate and rhythm, no murmurs / rubs / gallops. No extremity edema. 2+ pedal pulses. No carotid bruits.  Abdomen: no tenderness, no masses palpated. No hepatosplenomegaly. Bowel sounds positive.  Musculoskeletal: no clubbing / cyanosis. No joint deformity upper and lower extremities. Good ROM, no contractures. Normal muscle tone.  Skin: no rashes, lesions, ulcers. No induration Neurologic: CN 2-12 grossly intact. Sensation intact, DTR normal. Strength 5/5 in all 4.  Psychiatric: Normal judgment and insight. Alert and oriented x 3. Normal mood.    Labs on Admission: I have personally reviewed following labs and imaging studies  CBC: Recent Labs  Lab 07/12/18 2358    WBC 6.9  NEUTROABS 5.4  HGB 9.5*  HCT 31.4*  MCV 100.6*  PLT 101*   Basic Metabolic Panel: Recent Labs  Lab 07/12/18 2358  NA 138  K 7.4*  CL 101  CO2 19*  GLUCOSE 88  BUN 97*  CREATININE 19.89*  CALCIUM 8.0*   GFR: CrCl cannot be calculated (Unknown ideal weight.). Liver Function Tests: No results for input(s): AST, ALT, ALKPHOS, BILITOT, PROT, ALBUMIN in the last 168 hours. No results for input(s): LIPASE, AMYLASE in the last 168 hours. No results for input(s): AMMONIA in the last 168 hours. Coagulation Profile: Recent Labs  Lab 07/12/18 2358  INR 1.28   Cardiac Enzymes: No results for input(s): CKTOTAL, CKMB, CKMBINDEX, TROPONINI in the last 168 hours. BNP (last 3 results) No results for input(s): PROBNP in the last 8760 hours. HbA1C: No results for input(s): HGBA1C in the last 72 hours. CBG: No results for input(s): GLUCAP in the last 168 hours. Lipid Profile: No results for input(s): CHOL, HDL, LDLCALC, TRIG, CHOLHDL, LDLDIRECT in the last 72 hours. Thyroid Function Tests: No results for input(s): TSH, T4TOTAL, FREET4, T3FREE, THYROIDAB in the last 72 hours. Anemia Panel: No results for input(s): VITAMINB12, FOLATE, FERRITIN, TIBC, IRON, RETICCTPCT in the last 72 hours. Urine analysis:    Component Value Date/Time   COLORURINE YELLOW 05/08/2010 1335   APPEARANCEUR CLEAR 05/08/2010 1335   LABSPEC 1.009 05/08/2010 1335   PHURINE 6.0 05/08/2010 1335   GLUCOSEU NEGATIVE 05/08/2010 1335   HGBUR NEGATIVE 05/08/2010 1335   BILIRUBINUR NEGATIVE 05/08/2010 1335   KETONESUR NEGATIVE 05/08/2010 1335   PROTEINUR 100 (A) 05/08/2010 1335   UROBILINOGEN 0.2 05/08/2010 1335   NITRITE NEGATIVE 05/08/2010 1335   LEUKOCYTESUR SMALL (A) 05/08/2010 1335   Sepsis Labs: !!!!!!!!!!!!!!!!!!!!!!!!!!!!!!!!!!!!!!!!!!!! @LABRCNTIP (procalcitonin:4,lacticidven:4) )No results found for this or any previous visit (from the past 240 hour(s)).   Radiological Exams on  Admission: Dg Chest Port 1 View  Result Date: 07/12/2018 CLINICAL DATA:  Shortness of breath EXAM: PORTABLE CHEST 1 VIEW COMPARISON:  04/15/2018 FINDINGS: Cardiomegaly.  No consolidation or effusion.  No pneumothorax. IMPRESSION: No active disease.  Cardiomegaly. Electronically Signed   By: Jasmine Pang M.D.   On: 07/12/2018 23:51    EKG: Independently reviewed.  Normal sinus rhythm no peaked T waves Old chart reviewed Chest x-ray reviewed no infiltrate or edema  Assessment/Plan 63 year old male dialysis patient comes in with acute hypoxic respiratory  failure secondary to dialysis needs and hyperkalemia Principal Problem:   Hyperkalemia-patient in dialysis now.  Repeat in the morning.  Active Problems:   End-stage renal disease on hemodialysis (HCC)-noted patient with longstanding history of noncompliance.  Nephrology consulted.  Appreciate nephrology's help.    Acute respiratory failure with hypoxia (HCC)-this is improved patient now on room air.  Secondary to volume overload.    Noncompliance of patient with renal dialysis (HCC)-noted    HEPATITIS C-stable and noted    HYPERTENSION, BENIGN SYSTEMIC-stable clarify and resume home meds however it appears he does not take hardly any    Polysubstance abuse (HCC)-noted  Hematuria-monitor closely for recurrence.  None at this time.  Check urinalysis.     DVT prophylaxis: SCDs Code Status: Full Family Communication: None Disposition Plan: 1 to 3 days Consults called: Nephrology Admission status: Admission   Demonte Dobratz A MD [facility]  If 7PM-7AM, please contact night-coverage [email or web address] Password [email or web address]  07/13/2018, 8:02 AM

## 2018-07-14 ENCOUNTER — Inpatient Hospital Stay (HOSPITAL_COMMUNITY): Payer: Medicare Other

## 2018-07-14 DIAGNOSIS — E875 Hyperkalemia: Principal | ICD-10-CM

## 2018-07-14 DIAGNOSIS — N186 End stage renal disease: Secondary | ICD-10-CM

## 2018-07-14 DIAGNOSIS — Z992 Dependence on renal dialysis: Secondary | ICD-10-CM

## 2018-07-14 DIAGNOSIS — N451 Epididymitis: Secondary | ICD-10-CM

## 2018-07-14 DIAGNOSIS — J9601 Acute respiratory failure with hypoxia: Secondary | ICD-10-CM

## 2018-07-14 LAB — CBC
HCT: 30.2 % — ABNORMAL LOW (ref 39.0–52.0)
Hemoglobin: 9.2 g/dL — ABNORMAL LOW (ref 13.0–17.0)
MCH: 30.3 pg (ref 26.0–34.0)
MCHC: 30.5 g/dL (ref 30.0–36.0)
MCV: 99.3 fL (ref 80.0–100.0)
NRBC: 0 % (ref 0.0–0.2)
Platelets: 105 10*3/uL — ABNORMAL LOW (ref 150–400)
RBC: 3.04 MIL/uL — ABNORMAL LOW (ref 4.22–5.81)
RDW: 16.2 % — ABNORMAL HIGH (ref 11.5–15.5)
WBC: 3.6 10*3/uL — ABNORMAL LOW (ref 4.0–10.5)

## 2018-07-14 LAB — BASIC METABOLIC PANEL
Anion gap: 13 (ref 5–15)
BUN: 52 mg/dL — ABNORMAL HIGH (ref 8–23)
CHLORIDE: 99 mmol/L (ref 98–111)
CO2: 25 mmol/L (ref 22–32)
Calcium: 7.8 mg/dL — ABNORMAL LOW (ref 8.9–10.3)
Creatinine, Ser: 10.94 mg/dL — ABNORMAL HIGH (ref 0.61–1.24)
GFR calc Af Amer: 5 mL/min — ABNORMAL LOW (ref >60)
GFR calc non Af Amer: 4 mL/min — ABNORMAL LOW (ref >60)
Glucose, Bld: 86 mg/dL (ref 70–99)
Potassium: 5.2 mmol/L — ABNORMAL HIGH (ref 3.5–5.1)
Sodium: 137 mmol/L (ref 135–145)

## 2018-07-14 IMAGING — US US SCROTUM W/ DOPPLER COMPLETE
1 series · 13 of 25 positions shown · non-contrast
Comparison: None.

CLINICAL DATA: 62-year-old male with left-sided scrotal pain for
the past 4 days

EXAM:
SCROTAL ULTRASOUND
DOPPLER ULTRASOUND OF THE TESTICLES
TECHNIQUE: Complete ultrasound examination of the testicles, epididymis, and
other scrotal structures was performed. Color and spectral Doppler
ultrasound were also utilized to evaluate blood flow to the
testicles.

[Series 1: us scrotum w/ doppler complete · 13 of 55 slices shown]
[im 1/55]
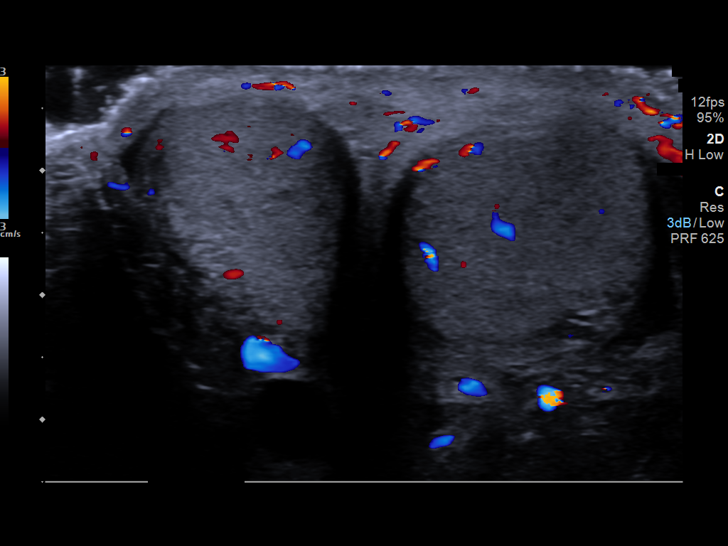
[im 5/55]
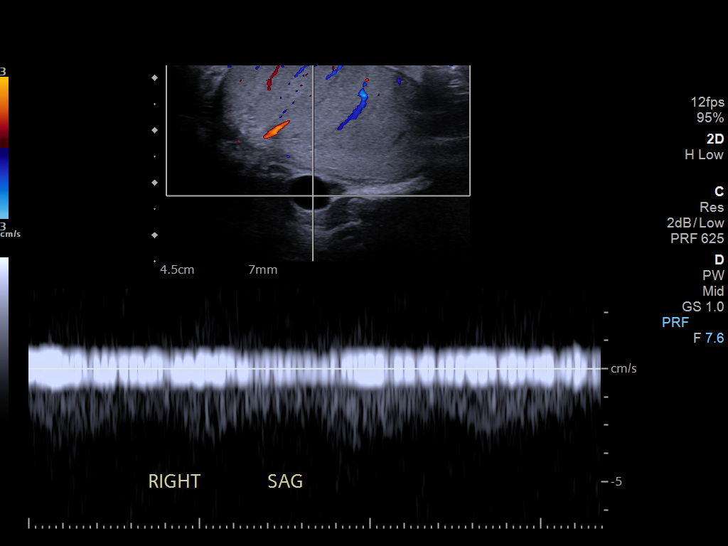
[im 10/55]
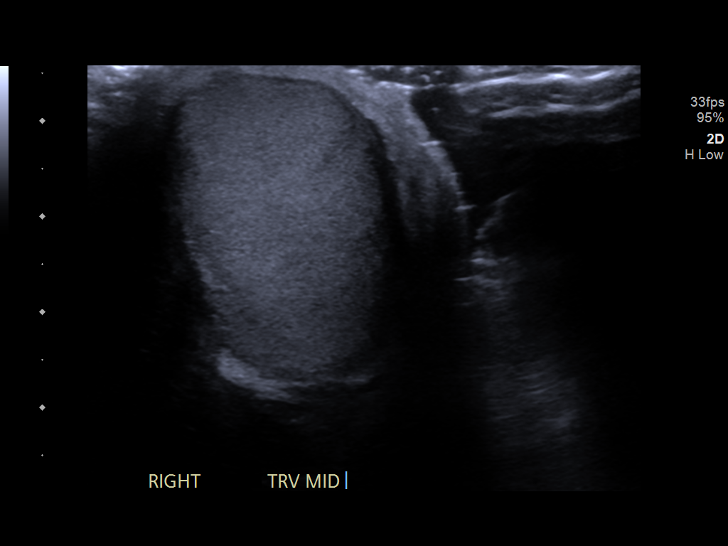
[im 14/55]
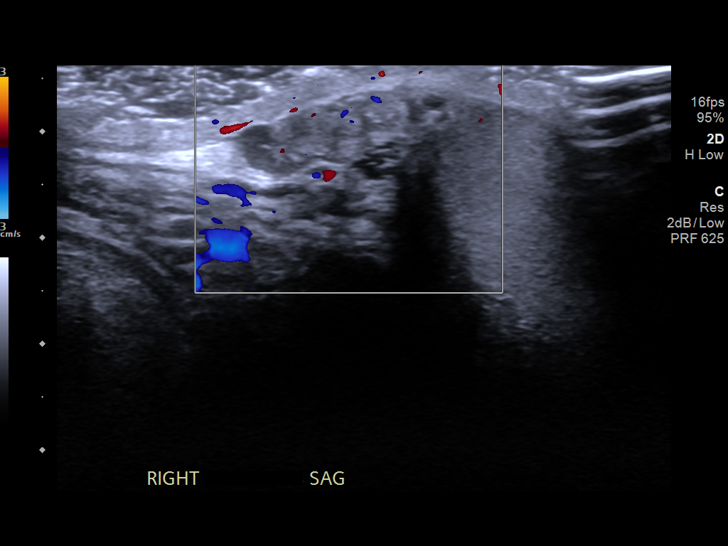
[im 19/55]
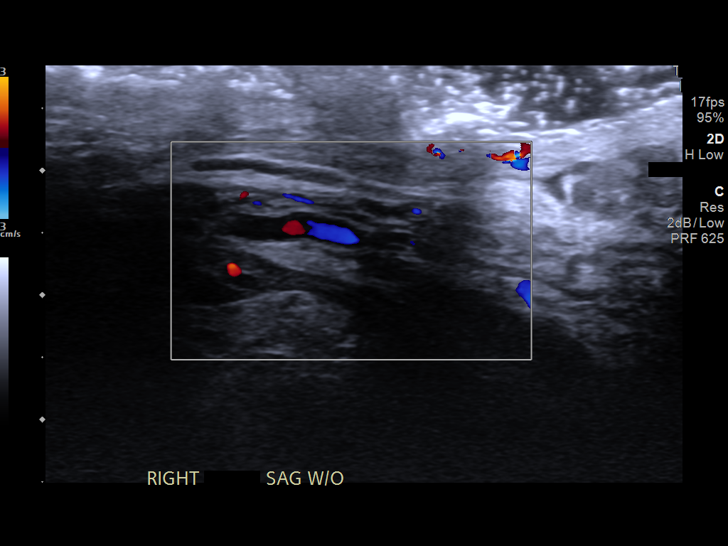
[im 23/55]
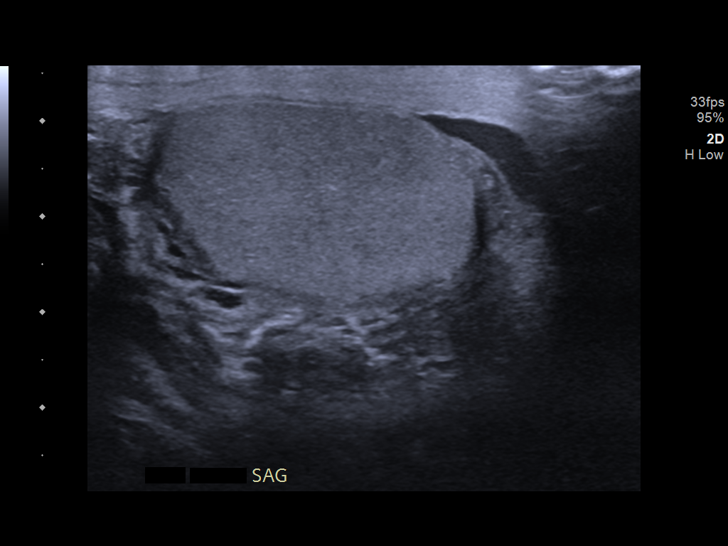
[im 28/55]
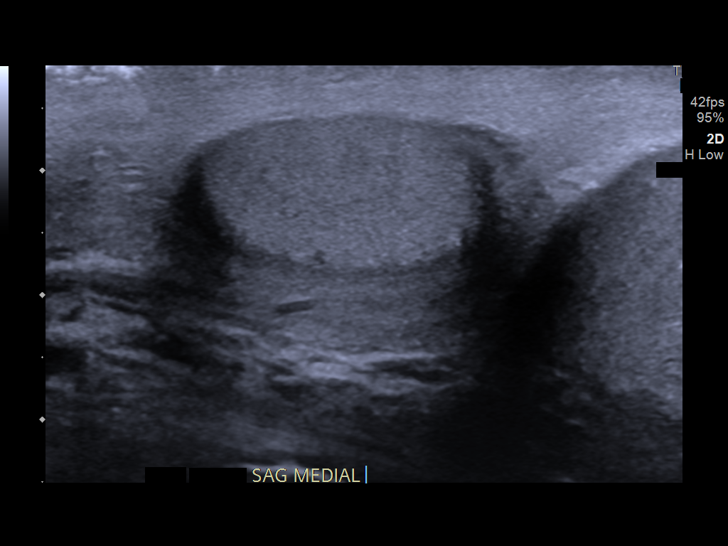
[im 32/55]
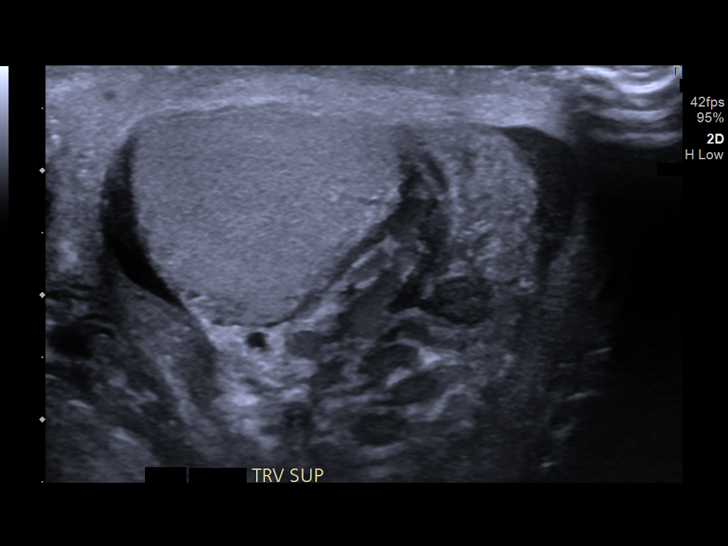
[im 37/55]
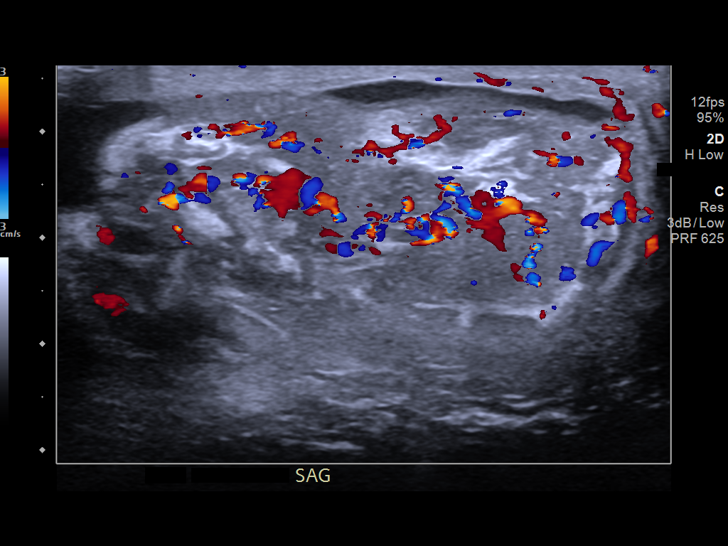
[im 41/55]
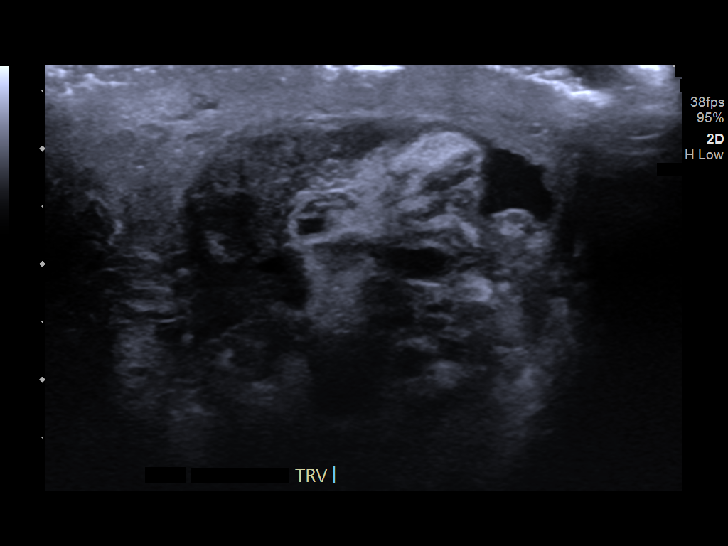
[im 46/55]
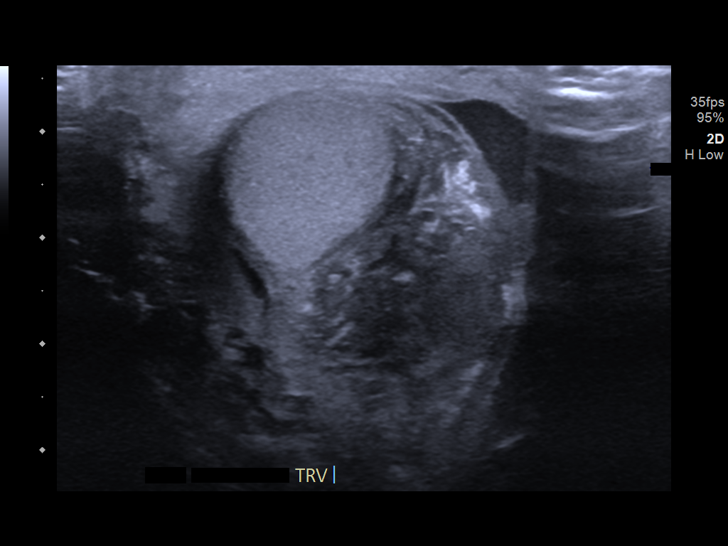
[im 50/55]
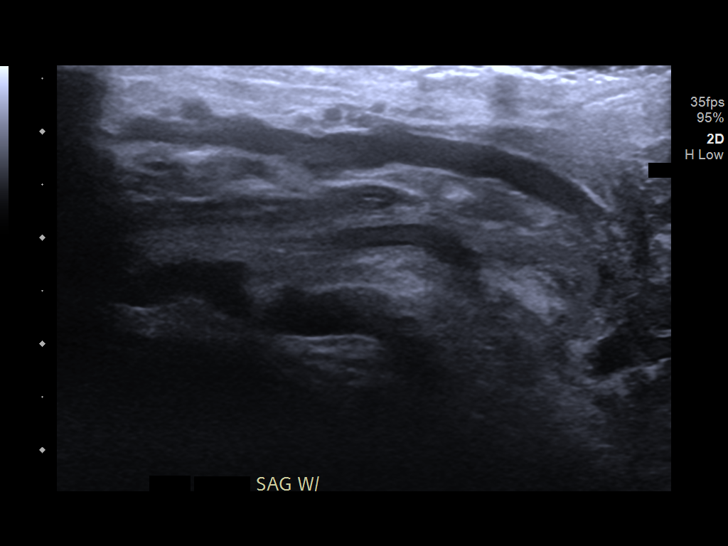
[im 55/55]
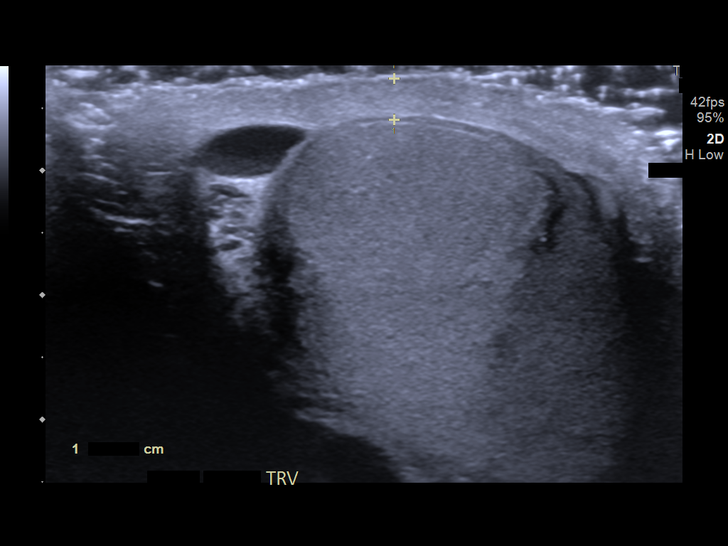

[13 of 25 positions shown; findings below may reference images not displayed]

FINDINGS: Right testicle

Measurements: 4.4 x 2.3 x 2.2 cm. No mass or microlithiasis
visualized.

Left testicle

Measurements: 3.5 x 2.1 x 2.6 cm. No mass or microlithiasis
visualized. Scrotal skin thickening present on the left.

Right epididymis: Normal in size. No hypervascularity. Circumscribed
anechoic simple cyst in the epididymal head measuring 0.6 x 1.0 x
0.5 cm.

Left epididymis:  Enlarged and hypervascular left epididymis.

Hydrocele:  None visualized.

Varicocele:  Positive for a left-sided varicocele.

Pulsed Doppler interrogation of both testes demonstrates normal low
resistance arterial and venous waveforms bilaterally.
IMPRESSION: 1. Acute left epididymitis.
2. Predominantly left-sided scrotal skin thickening, likely
reactive.
3. Small left varicocele.
4. Epididymal cyst versus spermatocele in the right epididymal head
noted incidentally.

## 2018-07-14 MED ORDER — AZITHROMYCIN 500 MG PO TABS
1000.0000 mg | ORAL_TABLET | Freq: Once | ORAL | Status: AC
  Administered 2018-07-14: 1000 mg via ORAL
  Filled 2018-07-14: qty 2

## 2018-07-14 MED ORDER — SODIUM CHLORIDE 0.9 % IV SOLN
1.0000 g | INTRAVENOUS | Status: DC
  Administered 2018-07-14: 1 g via INTRAVENOUS
  Filled 2018-07-14: qty 10

## 2018-07-14 MED ORDER — LIDOCAINE HCL (CARDIAC) PF 100 MG/5ML IV SOSY
PREFILLED_SYRINGE | INTRAVENOUS | Status: AC
  Administered 2018-07-14: 1 mL
  Filled 2018-07-14: qty 5

## 2018-07-14 MED ORDER — CEFTRIAXONE SODIUM 500 MG IJ SOLR: 250.0000 mg | Freq: Once | INTRAMUSCULAR | Status: DC

## 2018-07-14 MED ORDER — HEPARIN SODIUM (PORCINE) 5000 UNIT/ML IJ SOLN
5000.0000 [IU] | Freq: Three times a day (TID) | INTRAMUSCULAR | Status: DC
  Administered 2018-07-14 – 2018-07-15 (×3): 5000 [IU] via SUBCUTANEOUS
  Filled 2018-07-14 (×3): qty 1

## 2018-07-14 MED ORDER — CEFTRIAXONE SODIUM 500 MG IJ SOLR
250.0000 mg | Freq: Once | INTRAMUSCULAR | Status: AC
  Administered 2018-07-14: 250 mg via INTRAMUSCULAR
  Filled 2018-07-14: qty 500

## 2018-07-14 MED ORDER — DOXYCYCLINE HYCLATE 100 MG PO TABS
100.0000 mg | ORAL_TABLET | Freq: Two times a day (BID) | ORAL | Status: DC
  Administered 2018-07-14 – 2018-07-15 (×3): 100 mg via ORAL
  Filled 2018-07-14 (×3): qty 1

## 2018-07-14 MED ORDER — DARBEPOETIN ALFA 200 MCG/0.4ML IJ SOSY: 200.0000 ug | PREFILLED_SYRINGE | INTRAMUSCULAR | Status: DC

## 2018-07-14 MED ORDER — CEFTRIAXONE SODIUM 500 MG IJ SOLR
250.0000 mg | Freq: Once | INTRAMUSCULAR | Status: DC
  Filled 2018-07-14: qty 500

## 2018-07-14 NOTE — Progress Notes (Signed)
PROGRESS NOTE                                                                                                                                                                                                             Patient Demographics:    Raymond [name], is a 63 y.o. male, DOB - 06/21/1956, KCM:[id number]  Admit date - 07/12/2018   Admitting Physician Norval Morton, MD  Outpatient Primary MD for the patient is Scot Jun, FNP  LOS - 1   Chief Complaint  Patient presents with  . Shortness of Breath       Brief Narrative    63 y.o. male with medical history significant of end-stage renal disease very noncompliant with his dialysis, drug abuse, COPD, hepatitis C comes in with shortness of breath and feeling weak.  As well reports some hematuria and scrotal pain, patient was hypoxic 78% on arrival, noted to be volume overload from missing dialysis, hyperkalemia 7.5, he made for emergent dialysis, work-up significant for acute epididymitis.   Subjective:    [name] today has, No headache, No chest pain, No abdominal pain - No Nausea, patient denies any hematuria since admission, he does report scrotal pain .   Assessment  & Plan :    Principal Problem:   Hyperkalemia Active Problems:   HEPATITIS C   HYPERTENSION, BENIGN SYSTEMIC   Polysubstance abuse ([place])   End-stage renal disease on hemodialysis (HCC)   Dyspnea   Acute respiratory failure with hypoxia (HCC)   Noncompliance of patient with renal dialysis ([place])   Acute epididymitis with hematuria -Well at 101.5, scrotal ultrasound confirmed acute epididymitis, he is sexually active, initially received p.o. azithromycin and IM Rocephin, now ultrasound is significant for epididymitis, will start on p.o. doxycycline and IV Rocephin .  Acute to hypoxic respiratory failure  -Saturation was 78% on admission, hypoxic secondary to volume overload from missing dialysis, he is back on  room air currently.  Hyperkalemia -Resolved with dialysis  End-stage renal disease on hemodialysis (HCC) -Consulted, dialysis per renal  Noncompliance of patient with renal dialysis ([place]) -Counseled  HEPATITIS C -status post IFN  HYPERTENSION,  -Blood pressure improved with hemodialysis  Anemia -Being of chronic kidney disease, hemoglobin 9.2, on ESA per renal  COPD -No active wheezing   Code Status : Full  Family Communication  :  None at bedside  Disposition Plan  : Home when stable  Barriers For Discharge : on IV antibiotics for acute epididymitis  Consults  :  Renal  Procedures  : None  DVT Prophylaxis  :  [place] heparin  Lab Results  Component Value Date   PLT 105 (L) 07/14/2018    Antibiotics  :    Anti-infectives (From admission, onward)   Start     Dose/Rate Route Frequency Ordered Stop   07/14/18 1345  doxycycline (VIBRA-TABS) tablet 100 mg     100 mg Oral Every 12 hours 07/14/18 1344     07/14/18 1345  cefTRIAXone (ROCEPHIN) 1 g in sodium chloride 0.9 % 100 mL IVPB     1 g 200 mL/hr over 30 Minutes Intravenous Every 24 hours 07/14/18 1344     07/14/18 0900  cefTRIAXone (ROCEPHIN) injection 250 mg     250 mg Intramuscular  Once 07/14/18 0805 07/14/18 0921   07/14/18 0830  cefTRIAXone (ROCEPHIN) injection 250 mg  Status:  Discontinued     250 mg Intramuscular  Once 07/14/18 0745 07/14/18 0804   07/14/18 0830  cefTRIAXone (ROCEPHIN) injection 250 mg  Status:  Discontinued     250 mg Intramuscular  Once 07/14/18 0804 07/14/18 0805   07/14/18 0800  azithromycin (ZITHROMAX) tablet 1,000 mg     1,000 mg Oral  Once 07/14/18 0745 07/14/18 0921        Objective:   Vitals:   07/13/18 1202 07/13/18 1208 07/13/18 2107 07/14/18 0542  BP: (!) 65/54 135/90 111/77 116/76  Pulse: 93 (!) 113 91 72  Resp:   18 16  Temp: (!) 97.2 F (36.2 C)  (!) 101.5 F (38.6 C) 98.4 F (36.9 C)  TempSrc:      SpO2: (!) 63% 100% 96% 97%  Weight:        Wt Readings  from Last 3 Encounters:  07/13/18 71.3 kg  05/17/18 70.3 kg  05/15/18 71.6 kg     Intake/Output Summary (Last 24 hours) at 07/14/2018 1346 Last data filed at 07/14/2018 9476 Gross per 24 hour  Intake 3 ml  Output -  Net 3 ml     Physical Exam  Awake Alert, Oriented X 3, No new F.N deficits, Normal affect Symmetrical Chest wall movement, Good air movement bilaterally, CTAB RRR,No Gallops,Rubs or new Murmurs, No Parasternal Heave +ve B.Sounds, Abd Soft, No tenderness, No rebound - guarding or rigidity. No Cyanosis, Clubbing or edema, No new Rash or bruise   No penile discharge could be noted, has left scrotal tenderness to palpation     Data Review:    CBC Recent Labs  Lab 07/12/18 2358 07/14/18 0504  WBC 6.9 3.6*  HGB 9.5* 9.2*  HCT 31.4* 30.2*  PLT 101* 105*  MCV 100.6* 99.3  MCH 30.4 30.3  MCHC 30.3 30.5  RDW 16.6* 16.2*  LYMPHSABS 0.7  --   MONOABS 0.8  --   EOSABS 0.0  --   BASOSABS 0.0  --     Chemistries  Recent Labs  Lab 07/12/18 2358 07/13/18 0959 07/14/18 0504  NA 138  --  137  K 7.4* 3.1* 5.2*  CL 101  --  99  CO2 19*  --  25  GLUCOSE 88  --  86  BUN 97*  --  52*  CREATININE 19.89*  --  10.94*  CALCIUM 8.0*  --  7.8*   ------------------------------------------------------------------------------------------------------------------ No results for input(s): CHOL, HDL, LDLCALC, TRIG, CHOLHDL, LDLDIRECT in the  last 72 hours.  Lab Results  Component Value Date   HGBA1C 4.6 (L) 07/13/2017   ------------------------------------------------------------------------------------------------------------------ No results for input(s): TSH, T4TOTAL, T3FREE, THYROIDAB in the last 72 hours.  Invalid input(s): FREET3 ------------------------------------------------------------------------------------------------------------------ No results for input(s): VITAMINB12, FOLATE, FERRITIN, TIBC, IRON, RETICCTPCT in the last 72 hours.  Coagulation  profile Recent Labs  Lab 07/12/18 2358  INR 1.28    No results for input(s): DDIMER in the last 72 hours.  Cardiac Enzymes No results for input(s): CKMB, TROPONINI, MYOGLOBIN in the last 168 hours.  Invalid input(s): CK ------------------------------------------------------------------------------------------------------------------    Component Value Date/Time   BNP 1,215.6 (H) 12/05/2016 0406    Inpatient Medications  Scheduled Meds: . amiodarone  200 mg Oral Daily  . Chlorhexidine Gluconate Cloth  6 each Topical Q0600  . [START ON 07/16/2018] darbepoetin (ARANESP) injection - DIALYSIS  200 mcg Intravenous Q Mon-HD  . doxycycline  100 mg Oral Q12H  . heparin injection (subcutaneous)  5,000 Units Subcutaneous Q8H  . metoprolol succinate  50 mg Oral Daily  . sodium chloride flush  3 mL Intravenous Q12H   Continuous Infusions: . sodium chloride    . cefTRIAXone (ROCEPHIN)  IV     PRN Meds:.sodium chloride, acetaminophen **OR** acetaminophen, ondansetron **OR** ondansetron (ZOFRAN) IV, sodium chloride flush  Micro Results No results found for this or any previous visit (from the past 240 hour(s)).  Radiology Reports Dg Chest Port 1 View  Result Date: 07/12/2018 CLINICAL DATA:  Shortness of breath EXAM: PORTABLE CHEST 1 VIEW COMPARISON:  04/15/2018 FINDINGS: Cardiomegaly.  No consolidation or effusion.  No pneumothorax. IMPRESSION: No active disease.  Cardiomegaly. Electronically Signed   By: Donavan Foil M.D.   On: 07/12/2018 23:51   US Scrotum W/doppler  Result Date: 07/14/2018 CLINICAL DATA:  63 year old male with left-sided scrotal pain for the past 4 days EXAM: SCROTAL ULTRASOUND DOPPLER ULTRASOUND OF THE TESTICLES TECHNIQUE: Complete ultrasound examination of the testicles, epididymis, and other scrotal structures was performed. Color and spectral Doppler ultrasound were also utilized to evaluate blood flow to the testicles. COMPARISON:  None. FINDINGS: Right testicle  Measurements: 4.4 x 2.3 x 2.2 cm. No mass or microlithiasis visualized. Left testicle Measurements: 3.5 x 2.1 x 2.6 cm. No mass or microlithiasis visualized. Scrotal skin thickening present on the left. Right epididymis: Normal in size. No hypervascularity. Circumscribed anechoic simple cyst in the epididymal head measuring 0.6 x 1.0 x 0.5 cm. Left epididymis:  Enlarged and hypervascular left epididymis. Hydrocele:  None visualized. Varicocele:  Positive for a left-sided varicocele. Pulsed Doppler interrogation of both testes demonstrates normal low resistance arterial and venous waveforms bilaterally. IMPRESSION: 1. Acute left epididymitis. 2. Predominantly left-sided scrotal skin thickening, likely reactive. 3. Small left varicocele. 4. Epididymal cyst versus spermatocele in the right epididymal head noted incidentally. Electronically Signed   By: Jacqulynn Cadet M.D.   On: 07/14/2018 12:34     Phillips Climes M.D on 07/14/2018 at 1:46 PM  Between 7am to 7pm - Pager - [phone]  After 7pm go to [email or web address] - password [email or web address]  [facility] -  Office  [phone]

## 2018-07-14 NOTE — Progress Notes (Signed)
  [facility] Progress Note   Subjective:  Elevated temp last night. Afebrile this am. No complaints this morning. Denies SOB, CP, N/V, abd pain  HD yesterday 4L off  K+ 7.4 >5.2   Objective Vitals:   07/13/18 1202 07/13/18 1208 07/13/18 2107 07/14/18 0542  BP: (!) 65/54 135/90 111/77 116/76  Pulse: 93 (!) 113 91 72  Resp:   18 16  Temp: (!) 97.2 F (36.2 C)  (!) 101.5 F (38.6 C) 98.4 F (36.9 C)  TempSrc:      SpO2: (!) 63% 100% 96% 97%  Weight:       Physical Exam General: WNWD male NAD  Heart: RRR Lungs: CTAB  Abdomen: soft NT/ND Extremities: No LE edema  Dialysis Access: LUE AVF +bruit   Dialysis Orders:  [place]  4hr MWF   Lt [place] EDW 72kg last HD 12/29 and left after 2hr54min at 75kg 2/2.5   Heparin 2000 units Parsabiv 10 qtx    Mirc 30q2weeks (note of receiving 225 06/05/18) Renvela 5 tabs TIDM  Rocaltrol 1.25 TIW  Assessment/Plan: 1. Hyperkalemia - 2/2 missed HD. Corrected with dialysis yesterday. K 5.2 2. Fever - Hematuria noted on admit. UA ordered. On Rocepin/azithromycin  3. ESRD - MWF HD. Next HD 1/6.  4. HTN/volume  -BP/volume improved after HD. Net UF 4L Post HD wt 71.3kg. Now below outpatient EDW. Lower at discharge  5. Anemia - Hgb 9.2 Resume ESA next HD  6. 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 Parsabiv not available here  7. HCV - s/p IFN  8. COPD 9. PAD   Raymond Child PA-C [facility] Pager [phone] 07/14/2018,10:34 AM  LOS: 1 day   Additional Objective Labs: Basic Metabolic Panel: Recent Labs  Lab 07/12/18 2358 07/13/18 0959 07/14/18 0504  NA 138  --  137  K 7.4* 3.1* 5.2*  CL 101  --  99  CO2 19*  --  25  GLUCOSE 88  --  86  BUN 97*  --  52*  CREATININE 19.89*  --  10.94*  CALCIUM 8.0*  --  7.8*   CBC: Recent Labs  Lab 07/12/18 2358 07/14/18 0504  WBC 6.9 3.6*  NEUTROABS 5.4  --   HGB 9.5* 9.2*  HCT 31.4* 30.2*  MCV 100.6* 99.3  PLT 101* 105*   Blood Culture    Component Value  Date/Time   SDES BLOOD RIGHT HAND 03/31/2018 0940   SPECREQUEST  03/31/2018 0940    BOTTLES DRAWN AEROBIC AND ANAEROBIC Blood Culture adequate volume   CULT  03/31/2018 0940    NO GROWTH 5 DAYS Performed at [facility], [place] [address] [place], [place] [address]    REPTSTATUS 04/05/2018 FINAL 03/31/2018 0940    Cardiac Enzymes: No results for input(s): CKTOTAL, CKMB, CKMBINDEX, TROPONINI in the last 168 hours. CBG: No results for input(s): GLUCAP in the last 168 hours. Iron Studies: No results for input(s): IRON, TIBC, TRANSFERRIN, FERRITIN in the last 72 hours. Lab Results  Component Value Date   INR 1.28 07/12/2018   INR 2.21 04/21/2018   INR 2.31 04/20/2018   Medications: . sodium chloride     . amiodarone  200 mg Oral Daily  . Chlorhexidine Gluconate Cloth  6 each Topical Q0600  . metoprolol succinate  50 mg Oral Daily  . sodium chloride flush  3 mL Intravenous Q12H

## 2018-07-14 NOTE — Plan of Care (Signed)
  Problem: Clinical Measurements: Goal: Diagnostic test results will improve Outcome: Progressing   

## 2018-07-15 ENCOUNTER — Other Ambulatory Visit: Payer: Self-pay

## 2018-07-15 LAB — MRSA PCR SCREENING: MRSA by PCR: NEGATIVE

## 2018-07-15 LAB — HIV ANTIBODY (ROUTINE TESTING W REFLEX): HIV Screen 4th Generation wRfx: NONREACTIVE

## 2018-07-15 MED ORDER — LEVOFLOXACIN 750 MG PO TABS
750.0000 mg | ORAL_TABLET | Freq: Once | ORAL | Status: AC
  Administered 2018-07-15: 750 mg via ORAL
  Filled 2018-07-15: qty 1

## 2018-07-15 MED ORDER — WARFARIN SODIUM 3 MG PO TABS: 3.0000 mg | ORAL_TABLET | Freq: Once | ORAL | 0 refills | Status: DC

## 2018-07-15 MED ORDER — DOXYCYCLINE HYCLATE 100 MG PO TABS: 100.0000 mg | ORAL_TABLET | Freq: Two times a day (BID) | ORAL | 0 refills | Status: DC

## 2018-07-15 NOTE — Discharge Summary (Signed)
Raymond [name], is a 63 y.o. male  DOB 1956-06-04  MRN [id number].  Admission date:  07/12/2018  Admitting Physician  Norval Morton, MD  Discharge Date:  07/15/2018   Primary MD  Scot Jun, FNP  Recommendations for primary care physician for things to follow:      Admission Diagnosis  Hyperkalemia [E87.5] Anemia associated with chronic renal failure [N18.9, D63.1] End-stage renal disease on hemodialysis ([place]) [N18.6, Z99.2] Urethral bleeding [N36.8]   Discharge Diagnosis  Hyperkalemia [E87.5] Anemia associated with chronic renal failure [N18.9, D63.1] End-stage renal disease on hemodialysis ([place]) [N18.6, Z99.2] Urethral bleeding [N36.8]    Principal Problem:   Hyperkalemia Active Problems:   HEPATITIS C   HYPERTENSION, BENIGN SYSTEMIC   Polysubstance abuse ([place])   End-stage renal disease on hemodialysis ([place])   Dyspnea   Acute respiratory failure with hypoxia ([place])   Noncompliance of patient with renal dialysis [facility])      Past Medical History:  Diagnosis Date  . Anemia   . CAD (coronary artery disease)    a. 03/2018: cath showing 20 to 30% stenosis along the LAD, luminal irregularities along the RCA, and angiographically normal LCx  . COPD (chronic obstructive pulmonary disease) ([place])   . Dyspnea    "with too much fluid"  . ESRD (end stage renal disease) on dialysis [facility])    "MWF; [facility] Rinks" (07/22/17)  . Hemodialysis patient ([place])   . Hepatitis C    Still positive s/p liver biopsy at [facility]  and interferon therapy for 6 months. Most recent lab work was on 10/24/12  . Hepatitis C    "took the tx; gone now" (12/05/2016)  Was treated  . History of blood transfusion ~ 2012/2013   "related to my kidneys; blood was low"  . Hypertension   . Substance abuse ([place])   . Thyroid disease     Past Surgical History:  Procedure Laterality Date  . ABDOMINAL AORTOGRAM W/LOWER  EXTREMITY N/A 02/08/2018   Procedure: ABDOMINAL AORTOGRAM W/LOWER EXTREMITY;  Surgeon: Conrad Old Field, MD;  Location: [place] CV LAB;  Service: Cardiovascular;  Laterality: N/A;  . AV FISTULA PLACEMENT Left Aug. 2013 ?  . FEMORAL-POPLITEAL BYPASS GRAFT Left 04/11/2018   Procedure: BYPASS GRAFT FEMORAL-POPLITEAL ARTERY LEFT LEG;  Surgeon: Rosetta Posner, MD;  Location: [place];  Service: Vascular;  Laterality: Left;  . LEFT HEART CATH AND CORONARY ANGIOGRAPHY N/A 03/29/2018   Procedure: LEFT HEART CATH AND CORONARY ANGIOGRAPHY;  Surgeon: Nelva Bush, MD;  Location: [place] CV LAB;  Service: Cardiovascular;  Laterality: N/A;  . LIVER BIOPSY    . ORIF ULNAR FRACTURE Left 05/18/2017   Procedure: OPEN REDUCTION INTERNAL FIXATION (ORIF) ULNAR FRACTURE;  Surgeon: Iran Planas, MD;  Location: [place];  Service: Orthopedics;  Laterality: Left;  . SHUNTOGRAM N/A 12/11/2012   Procedure: Earney Mallet;  Surgeon: Serafina Mitchell, MD;  [facility] C[facility] LAB;  Service: Cardiovascular;  Laterality: N/A;       History of present illness and  Hospital  Course:     Kindly see H&P for history of present illness and admission details, please review complete Labs, Consult reports and Test reports for all details in brief  HPI  from the history and physical done on the day of admission  62 y.o.malewith medical history significant ofend-stage renal disease very noncompliant with his dialysis, drug abuse, COPD, hepatitis C comes in with shortness of breath and feeling weak.  As well reports some hematuria and scrotal pain, patient was hypoxic 78% on arrival, noted to be volume overload from missing dialysis, hyperkalemia 7.5, he made for emergent dialysis, work-up significant for acute epididymitis.  Hospital Course    Acute epididymitis with hematuria - fever 101.5, scrotal ultrasound confirmed acute epididymitis, he is sexually active, but no sexual activity for last month and a half , did empirically with 1  g p.o. azithromycin and 250 mg IM Rocephin,ultrasound is significant for epididymitis, and his fever, he was treated with IV Rocephin and oral doxycycline during hospital stay, he is afebrile today, pain significantly subsided, he will be discharged on oral regimen, given he is resumed on his warfarin, so he will not be discharged on levofloxacin due to interaction with warfarin, he will be discharged on 10 days of oral doxycycline .  Acute to hypoxic respiratory failure  -Saturation was 78% on admission, hypoxic secondary to volume overload from missing dialysis, he is back on room air currently.  Hyperkalemia -Resolved with dialysis  End-stage renal disease on hemodialysis ([facility]) -Consulted, dialysis per renal  Noncompliance of patient with renal dialysis ([place]) -Counseled  HEPATITIS C -status post IFN  HYPERTENSION,  -Blood pressure improved with hemodialysis  Anemia -Being of chronic kidney disease, hemoglobin 9.2, on ESA per renal  COPD -No active wheezing   paroxysmal A. Fib -Continue with amiodarone, heart rate controlled, patient used to be on warfarin, he stopped taking it for unknown reason, I have instructed him to do move his warfarin, and follow INR with his PCP later during the week, prescription for 10 tablets of warfarin to ensure he will be seen by his PCP next week for refills and INR recheck.  Now he is on warfarin, I have stopped his aspirin    Discharge Condition:  Stable   Follow UP  Follow-up Information    Scot Jun, FNP Follow up in 1 week(s).   Specialty:  Family Medicine Contact information: [place] [address] [phone]             Discharge Instructions  and  Discharge Medications    Discharge Instructions    Discharge instructions   Complete by:  As directed    Follow with Primary MD Scot Jun, FNP in 7 days    Activity: As tolerated with Full fall precautions use walker/cane &  assistance as needed   Disposition Home    Diet: Renal diet with 1200 cc fluid restrictions  For Heart failure patients - Check your Weight same time everyday, if you gain over 2 pounds, or you develop in leg swelling, experience more shortness of breath or chest pain, call your Primary MD immediately. Follow Cardiac Low Salt Diet and 1.5 lit/day fluid restriction.   On your next visit with your primary care physician please Get Medicines reviewed and adjusted.   Please request your Prim.MD to go over all Hospital Tests and Procedure/Radiological results at the follow up, please get all Hospital records sent to your Prim MD by signing hospital release before you  go home.   If you experience worsening of your admission symptoms, develop shortness of breath, life threatening emergency, suicidal or homicidal thoughts you must seek medical attention immediately by calling 911 or calling your MD immediately  if symptoms less severe.  You Must read complete instructions/literature along with all the possible adverse reactions/side effects for all the Medicines you take and that have been prescribed to you. Take any new Medicines after you have completely understood and accpet all the possible adverse reactions/side effects.   Do not drive, operating heavy machinery, perform activities at heights, swimming or participation in water activities or provide baby sitting services if your were admitted for syncope or siezures until you have seen by Primary MD or a Neurologist and advised to do so again.  Do not drive when taking Pain medications.    Do not take more than prescribed Pain, Sleep and Anxiety Medications  Special Instructions: If you have smoked or chewed Tobacco  in the last 2 yrs please stop smoking, stop any regular Alcohol  and or any Recreational drug use.  Wear Seat belts while driving.   Please note  You were cared for by a hospitalist during your hospital stay. If you have  any questions about your discharge medications or the care you received while you were in the hospital after you are discharged, you can call the unit and asked to speak with the hospitalist on call if the hospitalist that took care of you is not available. Once you are discharged, your primary care physician will handle any further medical issues. Please note that NO REFILLS for any discharge medications will be authorized once you are discharged, as it is imperative that you return to your primary care physician (or establish a relationship with a primary care physician if you do not have one) for your aftercare needs so that they can reassess your need for medications and monitor your lab values.   Increase activity slowly   Complete by:  As directed      Allergies as of 07/15/2018   No Known Allergies     Medication List    STOP taking these medications   aspirin EC 81 MG tablet     TAKE these medications   acetaminophen 325 MG tablet Commonly known as:  TYLENOL Take 650 mg by mouth every 6 (six) hours as needed for mild pain.   albuterol 108 (90 Base) MCG/ACT inhaler Commonly known as:  PROVENTIL HFA;VENTOLIN HFA Inhale 2 puffs into the lungs every 6 (six) hours as needed for wheezing or shortness of breath.   amiodarone 200 MG tablet Commonly known as:  PACERONE Take 1 tablet (200 mg total) by mouth daily.   amLODipine 2.5 MG tablet Commonly known as:  NORVASC Take 1 tablet (2.5 mg total) by mouth daily.   atorvastatin 40 MG tablet Commonly known as:  LIPITOR Take 1 tablet (40 mg total) by mouth daily.   calcitRIOL 0.25 MCG capsule Commonly known as:  ROCALTROL Take 3 capsules (0.75 mcg total) by mouth every Monday, Wednesday, and Friday with hemodialysis.   Darbepoetin Alfa 100 MCG/0.5ML Sosy injection Commonly known as:  ARANESP Inject 0.5 mLs (100 mcg total) into the vein every Wednesday with hemodialysis.   doxycycline 100 MG tablet Commonly known as:   VIBRA-TABS Take 1 tablet (100 mg total) by mouth every 12 (twelve) hours.   gabapentin 300 MG capsule Commonly known as:  NEURONTIN Take 300 mg by mouth at bedtime as needed (sleep).  metoprolol succinate 50 MG 24 hr tablet Commonly known as:  TOPROL-XL Take 1 tablet (50 mg total) by mouth daily. Take with or immediately following a meal. What changed:  when to take this   multivitamin Tabs tablet Take 1 tablet by mouth at bedtime.   ondansetron 4 MG tablet Commonly known as:  ZOFRAN Take 1 tablet (4 mg total) by mouth every 8 (eight) hours as needed for nausea or vomiting.   oxyCODONE-acetaminophen 5-325 MG tablet Commonly known as:  PERCOCET/ROXICET Take 1 tablet by mouth every 4 (four) hours as needed for moderate pain.   pantoprazole 40 MG tablet Commonly known as:  PROTONIX Take 1 tablet (40 mg total) by mouth daily.   RENVELA 800 MG tablet Generic drug:  sevelamer carbonate Take 4,800 mg by mouth 3 (three) times daily with meals.   SENSIPAR 30 MG tablet Generic drug:  cinacalcet Take 30 mg by mouth daily.   warfarin 3 MG tablet Commonly known as:  COUMADIN Take 1 tablet (3 mg total) by mouth one time only at 6 PM.         Diet and Activity recommendation: See Discharge Instructions above   Consults obtained -  None   Major procedures and Radiology Reports - PLEASE review detailed and final reports for all details, in brief -    Dg Chest Port 1 View  Result Date: 07/12/2018 CLINICAL DATA:  Shortness of breath EXAM: PORTABLE CHEST 1 VIEW COMPARISON:  04/15/2018 FINDINGS: Cardiomegaly.  No consolidation or effusion.  No pneumothorax. IMPRESSION: No active disease.  Cardiomegaly. Electronically Signed   By: Donavan Foil M.D.   On: 07/12/2018 23:51   US Scrotum W/doppler  Result Date: 07/14/2018 CLINICAL DATA:  63 year old male with left-sided scrotal pain for the past 4 days EXAM: SCROTAL ULTRASOUND DOPPLER ULTRASOUND OF THE TESTICLES TECHNIQUE: Complete  ultrasound examination of the testicles, epididymis, and other scrotal structures was performed. Color and spectral Doppler ultrasound were also utilized to evaluate blood flow to the testicles. COMPARISON:  None. FINDINGS: Right testicle Measurements: 4.4 x 2.3 x 2.2 cm. No mass or microlithiasis visualized. Left testicle Measurements: 3.5 x 2.1 x 2.6 cm. No mass or microlithiasis visualized. Scrotal skin thickening present on the left. Right epididymis: Normal in size. No hypervascularity. Circumscribed anechoic simple cyst in the epididymal head measuring 0.6 x 1.0 x 0.5 cm. Left epididymis:  Enlarged and hypervascular left epididymis. Hydrocele:  None visualized. Varicocele:  Positive for a left-sided varicocele. Pulsed Doppler interrogation of both testes demonstrates normal low resistance arterial and venous waveforms bilaterally. IMPRESSION: 1. Acute left epididymitis. 2. Predominantly left-sided scrotal skin thickening, likely reactive. 3. Small left varicocele. 4. Epididymal cyst versus spermatocele in the right epididymal head noted incidentally. Electronically Signed   By: Jacqulynn Cadet M.D.   On: 07/14/2018 12:34    Micro Results     Recent Results (from the past 240 hour(s))  MRSA PCR Screening     Status: None   Collection Time: 07/15/18  7:00 AM  Result Value Ref Range Status   MRSA by PCR NEGATIVE NEGATIVE Final    Comment:        The GeneXpert MRSA Assay (FDA approved for NASAL specimens only), is one component of a comprehensive MRSA colonization surveillance program. It is not intended to diagnose MRSA infection nor to guide or monitor treatment for MRSA infections. Performed at [facility], [place] [address], [place], [place] [address]        Today  Subjective:   Trayon Heick today has no headache,no chest abdominal pain,no new weakness tingling or numbness, feels much better wants to go home today.   Objective:   Blood pressure (!) 145/97, pulse 67,  temperature 97.7 F (36.5 C), temperature source Oral, resp. rate 18, height 5\' 8"  (1.727 m), weight 71.3 kg, SpO2 97 %.   Intake/Output Summary (Last 24 hours) at 07/15/2018 1216 Last data filed at 07/15/2018 0618 Gross per 24 hour  Intake 728 ml  Output -  Net 728 ml    Exam Awake Alert, Oriented x 3, No new F.N deficits, Normal affect Symmetrical Chest wall movement, Good air movement bilaterally, CTAB RRR,No Gallops,Rubs or new Murmurs, No Parasternal Heave +ve B.Sounds, Abd Soft, Non tender, , No rebound -guarding or rigidity. No Cyanosis, Clubbing or edema, No new Rash or bruise -There is no discharge, or dysuria noted from penis, his left scrotal tenderness has significantly subsided. Data Review   CBC w Diff:  Lab Results  Component Value Date   WBC 3.6 (L) 07/14/2018   HGB 9.2 (L) 07/14/2018   HGB 11.0 (L) 03/27/2018   HCT 30.2 (L) 07/14/2018   HCT 32.7 (L) 03/27/2018   PLT 105 (L) 07/14/2018   PLT 174 03/27/2018   LYMPHOPCT 9 07/12/2018   MONOPCT 11 07/12/2018   EOSPCT 0 07/12/2018   BASOPCT 0 07/12/2018    CMP:  Lab Results  Component Value Date   NA 137 07/14/2018   NA 140 05/15/2018   K 5.2 (H) 07/14/2018   CL 99 07/14/2018   CO2 25 07/14/2018   BUN 52 (H) 07/14/2018   BUN 38 (H) 05/15/2018   CREATININE 10.94 (H) 07/14/2018   PROT 7.6 05/15/2018   ALBUMIN 3.7 05/15/2018   BILITOT 0.3 05/15/2018   ALKPHOS 92 05/15/2018   AST 13 05/15/2018   ALT 5 05/15/2018  .   Total Time in preparing paper work, data evaluation and todays exam - 1 minutes  Phillips Climes M.D on 07/15/2018 at 12:16 PM  [facility]   Office  [phone]

## 2018-07-15 NOTE — Plan of Care (Signed)
Nsg Discharge Note  Admit Date:  07/12/2018 Discharge date: 07/15/2018   Raymond [name] to be D/C'd Home per MD order.  AVS completed.  Copy for chart, and copy for patient signed, and dated. Patient/caregiver able to verbalize understanding.  Discharge Medication: Allergies as of 07/15/2018   No Known Allergies      Medication List     STOP taking these medications    aspirin EC 81 MG tablet       TAKE these medications    acetaminophen 325 MG tablet Commonly known as:  TYLENOL Take 650 mg by mouth every 6 (six) hours as needed for mild pain.   albuterol 108 (90 Base) MCG/ACT inhaler Commonly known as:  PROVENTIL HFA;VENTOLIN HFA Inhale 2 puffs into the lungs every 6 (six) hours as needed for wheezing or shortness of breath.   amiodarone 200 MG tablet Commonly known as:  PACERONE Take 1 tablet (200 mg total) by mouth daily.   amLODipine 2.5 MG tablet Commonly known as:  NORVASC Take 1 tablet (2.5 mg total) by mouth daily.   atorvastatin 40 MG tablet Commonly known as:  LIPITOR Take 1 tablet (40 mg total) by mouth daily.   calcitRIOL 0.25 MCG capsule Commonly known as:  ROCALTROL Take 3 capsules (0.75 mcg total) by mouth every Monday, Wednesday, and Friday with hemodialysis.   Darbepoetin Alfa 100 MCG/0.5ML Sosy injection Commonly known as:  ARANESP Inject 0.5 mLs (100 mcg total) into the vein every Wednesday with hemodialysis.   doxycycline 100 MG tablet Commonly known as:  VIBRA-TABS Take 1 tablet (100 mg total) by mouth every 12 (twelve) hours.   gabapentin 300 MG capsule Commonly known as:  NEURONTIN Take 300 mg by mouth at bedtime as needed (sleep).   metoprolol succinate 50 MG 24 hr tablet Commonly known as:  TOPROL-XL Take 1 tablet (50 mg total) by mouth daily. Take with or immediately following a meal. What changed:  when to take this   multivitamin Tabs tablet Take 1 tablet by mouth at bedtime.   ondansetron 4 MG tablet Commonly known as:   ZOFRAN Take 1 tablet (4 mg total) by mouth every 8 (eight) hours as needed for nausea or vomiting.   oxyCODONE-acetaminophen 5-325 MG tablet Commonly known as:  PERCOCET/ROXICET Take 1 tablet by mouth every 4 (four) hours as needed for moderate pain.   pantoprazole 40 MG tablet Commonly known as:  PROTONIX Take 1 tablet (40 mg total) by mouth daily.   RENVELA 800 MG tablet Generic drug:  sevelamer carbonate Take 4,800 mg by mouth 3 (three) times daily with meals.   SENSIPAR 30 MG tablet Generic drug:  cinacalcet Take 30 mg by mouth daily.   warfarin 3 MG tablet Commonly known as:  COUMADIN Take 1 tablet (3 mg total) by mouth one time only at 6 PM.        Discharge Assessment: Vitals:   07/14/18 2234 07/15/18 0533  BP: (!) 173/92 (!) 145/97  Pulse: 71 67  Resp:    Temp: 97.7 F (36.5 C) 97.7 F (36.5 C)  SpO2: 99% 97%   Skin clean, dry and intact without evidence of skin break down, no evidence of skin tears noted. IV catheter discontinued intact. Site without signs and symptoms of complications - no redness or edema noted at insertion site, patient denies c/o pain - only slight tenderness at site.  Dressing with slight pressure applied.  D/c Instructions-Education: Discharge instructions given to patient/family with verbalized understanding. D/c education  completed with patient/family including follow up instructions, medication list, d/c activities limitations if indicated, with other d/c instructions as indicated by MD - patient able to verbalize understanding, all questions fully answered. Patient instructed to return to ED, call 911, or call MD for any changes in condition.  Patient escorted via [place], and D/C home via private auto.  Salley Slaughter, RN 07/15/2018 11:49 AM

## 2018-07-15 NOTE — Discharge Instructions (Signed)
Follow with Primary MD Scot Jun, FNP in 5 days  -Please check INR in 4-5 days at PCP office.   Activity: As tolerated with Full fall precautions use walker/cane & assistance as needed   Disposition Home    Diet: Renal diet with 1200 cc fluid restrictions  For Heart failure patients - Check your Weight same time everyday, if you gain over 2 pounds, or you develop in leg swelling, experience more shortness of breath or chest pain, call your Primary MD immediately. Follow Cardiac Low Salt Diet and 1.5 lit/day fluid restriction.   On your next visit with your primary care physician please Get Medicines reviewed and adjusted.   Please request your Prim.MD to go over all Hospital Tests and Procedure/Radiological results at the follow up, please get all Hospital records sent to your Prim MD by signing hospital release before you go home.   If you experience worsening of your admission symptoms, develop shortness of breath, life threatening emergency, suicidal or homicidal thoughts you must seek medical attention immediately by calling 911 or calling your MD immediately  if symptoms less severe.  You Must read complete instructions/literature along with all the possible adverse reactions/side effects for all the Medicines you take and that have been prescribed to you. Take any new Medicines after you have completely understood and accpet all the possible adverse reactions/side effects.   Do not drive, operating heavy machinery, perform activities at heights, swimming or participation in water activities or provide baby sitting services if your were admitted for syncope or siezures until you have seen by Primary MD or a Neurologist and advised to do so again.  Do not drive when taking Pain medications.    Do not take more than prescribed Pain, Sleep and Anxiety Medications  Special Instructions: If you have smoked or chewed Tobacco  in the last 2 yrs please stop smoking, stop any  regular Alcohol  and or any Recreational drug use.  Wear Seat belts while driving.   Please note  You were cared for by a hospitalist during your hospital stay. If you have any questions about your discharge medications or the care you received while you were in the hospital after you are discharged, you can call the unit and asked to speak with the hospitalist on call if the hospitalist that took care of you is not available. Once you are discharged, your primary care physician will handle any further medical issues. Please note that NO REFILLS for any discharge medications will be authorized once you are discharged, as it is imperative that you return to your primary care physician (or establish a relationship with a primary care physician if you do not have one) for your aftercare needs so that they can reassess your need for medications and monitor your lab values.

## 2018-07-18 DIAGNOSIS — D509 Iron deficiency anemia, unspecified: Secondary | ICD-10-CM | POA: Diagnosis not present

## 2018-07-18 DIAGNOSIS — N186 End stage renal disease: Secondary | ICD-10-CM | POA: Diagnosis not present

## 2018-07-18 DIAGNOSIS — N2581 Secondary hyperparathyroidism of renal origin: Secondary | ICD-10-CM | POA: Diagnosis not present

## 2018-07-20 DIAGNOSIS — D509 Iron deficiency anemia, unspecified: Secondary | ICD-10-CM | POA: Diagnosis not present

## 2018-07-20 DIAGNOSIS — N2581 Secondary hyperparathyroidism of renal origin: Secondary | ICD-10-CM | POA: Diagnosis not present

## 2018-07-20 DIAGNOSIS — N186 End stage renal disease: Secondary | ICD-10-CM | POA: Diagnosis not present

## 2018-07-23 DIAGNOSIS — N2581 Secondary hyperparathyroidism of renal origin: Secondary | ICD-10-CM | POA: Diagnosis not present

## 2018-07-23 DIAGNOSIS — D509 Iron deficiency anemia, unspecified: Secondary | ICD-10-CM | POA: Diagnosis not present

## 2018-07-23 DIAGNOSIS — N186 End stage renal disease: Secondary | ICD-10-CM | POA: Diagnosis not present

## 2018-07-24 ENCOUNTER — Ambulatory Visit: Payer: Self-pay | Admitting: Family Medicine

## 2018-07-25 ENCOUNTER — Other Ambulatory Visit: Payer: Self-pay

## 2018-07-25 DIAGNOSIS — F17200 Nicotine dependence, unspecified, uncomplicated: Secondary | ICD-10-CM

## 2018-07-25 DIAGNOSIS — I70219 Atherosclerosis of native arteries of extremities with intermittent claudication, unspecified extremity: Secondary | ICD-10-CM

## 2018-07-25 DIAGNOSIS — I739 Peripheral vascular disease, unspecified: Secondary | ICD-10-CM

## 2018-07-27 DIAGNOSIS — N2581 Secondary hyperparathyroidism of renal origin: Secondary | ICD-10-CM | POA: Diagnosis not present

## 2018-07-27 DIAGNOSIS — N186 End stage renal disease: Secondary | ICD-10-CM | POA: Diagnosis not present

## 2018-07-27 DIAGNOSIS — D509 Iron deficiency anemia, unspecified: Secondary | ICD-10-CM | POA: Diagnosis not present

## 2018-07-31 ENCOUNTER — Ambulatory Visit: Payer: Self-pay | Admitting: Vascular Surgery

## 2018-07-31 ENCOUNTER — Encounter (HOSPITAL_COMMUNITY): Payer: Self-pay

## 2018-07-31 ENCOUNTER — Encounter: Payer: Self-pay | Admitting: Vascular Surgery

## 2018-08-01 ENCOUNTER — Inpatient Hospital Stay (HOSPITAL_COMMUNITY)
Admission: EM | Admit: 2018-08-01 | Discharge: 2018-08-04 | DRG: 640 | Disposition: A | Discharge status: Home / Self Care | Payer: Medicare Other | Attending: Family Medicine | Admitting: Family Medicine

## 2018-08-01 ENCOUNTER — Encounter (HOSPITAL_COMMUNITY): Payer: Self-pay

## 2018-08-01 ENCOUNTER — Other Ambulatory Visit: Payer: Self-pay

## 2018-08-01 ENCOUNTER — Emergency Department (HOSPITAL_COMMUNITY): Payer: Medicare Other

## 2018-08-01 DIAGNOSIS — J449 Chronic obstructive pulmonary disease, unspecified: Secondary | ICD-10-CM

## 2018-08-01 DIAGNOSIS — Z992 Dependence on renal dialysis: Secondary | ICD-10-CM | POA: Diagnosis not present

## 2018-08-01 DIAGNOSIS — I120 Hypertensive chronic kidney disease with stage 5 chronic kidney disease or end stage renal disease: Secondary | ICD-10-CM | POA: Diagnosis not present

## 2018-08-01 DIAGNOSIS — I10 Essential (primary) hypertension: Secondary | ICD-10-CM | POA: Diagnosis not present

## 2018-08-01 DIAGNOSIS — Z9115 Patient's noncompliance with renal dialysis: Secondary | ICD-10-CM | POA: Diagnosis not present

## 2018-08-01 DIAGNOSIS — N185 Chronic kidney disease, stage 5: Secondary | ICD-10-CM | POA: Diagnosis not present

## 2018-08-01 DIAGNOSIS — J9601 Acute respiratory failure with hypoxia: Secondary | ICD-10-CM | POA: Diagnosis present

## 2018-08-01 DIAGNOSIS — R0602 Shortness of breath: Secondary | ICD-10-CM | POA: Diagnosis not present

## 2018-08-01 DIAGNOSIS — Z7901 Long term (current) use of anticoagulants: Secondary | ICD-10-CM

## 2018-08-01 DIAGNOSIS — D631 Anemia in chronic kidney disease: Secondary | ICD-10-CM | POA: Diagnosis not present

## 2018-08-01 DIAGNOSIS — F17210 Nicotine dependence, cigarettes, uncomplicated: Secondary | ICD-10-CM | POA: Diagnosis present

## 2018-08-01 DIAGNOSIS — E875 Hyperkalemia: Secondary | ICD-10-CM | POA: Diagnosis present

## 2018-08-01 DIAGNOSIS — Z91158 Patient's noncompliance with renal dialysis for other reason: Secondary | ICD-10-CM

## 2018-08-01 DIAGNOSIS — J441 Chronic obstructive pulmonary disease with (acute) exacerbation: Secondary | ICD-10-CM | POA: Diagnosis present

## 2018-08-01 DIAGNOSIS — I2510 Atherosclerotic heart disease of native coronary artery without angina pectoris: Secondary | ICD-10-CM | POA: Diagnosis present

## 2018-08-01 DIAGNOSIS — Z8249 Family history of ischemic heart disease and other diseases of the circulatory system: Secondary | ICD-10-CM

## 2018-08-01 DIAGNOSIS — E785 Hyperlipidemia, unspecified: Secondary | ICD-10-CM | POA: Diagnosis present

## 2018-08-01 DIAGNOSIS — N186 End stage renal disease: Secondary | ICD-10-CM

## 2018-08-01 DIAGNOSIS — I480 Paroxysmal atrial fibrillation: Secondary | ICD-10-CM | POA: Diagnosis present

## 2018-08-01 DIAGNOSIS — R079 Chest pain, unspecified: Secondary | ICD-10-CM | POA: Diagnosis not present

## 2018-08-01 DIAGNOSIS — E1122 Type 2 diabetes mellitus with diabetic chronic kidney disease: Secondary | ICD-10-CM | POA: Diagnosis present

## 2018-08-01 DIAGNOSIS — D61818 Other pancytopenia: Secondary | ICD-10-CM | POA: Diagnosis not present

## 2018-08-01 DIAGNOSIS — I739 Peripheral vascular disease, unspecified: Secondary | ICD-10-CM | POA: Diagnosis present

## 2018-08-01 DIAGNOSIS — R069 Unspecified abnormalities of breathing: Secondary | ICD-10-CM | POA: Diagnosis not present

## 2018-08-01 DIAGNOSIS — Z79899 Other long term (current) drug therapy: Secondary | ICD-10-CM

## 2018-08-01 DIAGNOSIS — Z833 Family history of diabetes mellitus: Secondary | ICD-10-CM

## 2018-08-01 DIAGNOSIS — Z590 Homelessness: Secondary | ICD-10-CM

## 2018-08-01 DIAGNOSIS — B1710 Acute hepatitis C without hepatic coma: Secondary | ICD-10-CM | POA: Diagnosis present

## 2018-08-01 DIAGNOSIS — K7460 Unspecified cirrhosis of liver: Secondary | ICD-10-CM | POA: Diagnosis present

## 2018-08-01 DIAGNOSIS — B1920 Unspecified viral hepatitis C without hepatic coma: Secondary | ICD-10-CM | POA: Diagnosis present

## 2018-08-01 DIAGNOSIS — E8770 Fluid overload, unspecified: Secondary | ICD-10-CM | POA: Diagnosis present

## 2018-08-01 LAB — CBC WITH DIFFERENTIAL/PLATELET
Abs Immature Granulocytes: 0.01 10*3/uL (ref 0.00–0.07)
BASOS PCT: 0 %
Basophils Absolute: 0 10*3/uL (ref 0.0–0.1)
EOS ABS: 0.1 10*3/uL (ref 0.0–0.5)
Eosinophils Relative: 1 %
HCT: 29.8 % — ABNORMAL LOW (ref 39.0–52.0)
Hemoglobin: 8.9 g/dL — ABNORMAL LOW (ref 13.0–17.0)
Immature Granulocytes: 0 %
Lymphocytes Relative: 28 %
Lymphs Abs: 1.1 10*3/uL (ref 0.7–4.0)
MCH: 30.1 pg (ref 26.0–34.0)
MCHC: 29.9 g/dL — ABNORMAL LOW (ref 30.0–36.0)
MCV: 100.7 fL — ABNORMAL HIGH (ref 80.0–100.0)
Monocytes Absolute: 0.5 10*3/uL (ref 0.1–1.0)
Monocytes Relative: 13 %
Neutro Abs: 2.2 10*3/uL (ref 1.7–7.7)
Neutrophils Relative %: 58 %
PLATELETS: 106 10*3/uL — AB (ref 150–400)
RBC: 2.96 MIL/uL — ABNORMAL LOW (ref 4.22–5.81)
RDW: 15.9 % — ABNORMAL HIGH (ref 11.5–15.5)
WBC: 3.8 10*3/uL — AB (ref 4.0–10.5)
nRBC: 0 % (ref 0.0–0.2)

## 2018-08-01 LAB — POCT I-STAT EG7
ACID-BASE DEFICIT: 8 mmol/L — AB (ref 0.0–2.0)
Bicarbonate: 18.8 mmol/L — ABNORMAL LOW (ref 20.0–28.0)
CALCIUM ION: 0.93 mmol/L — AB (ref 1.15–1.40)
HCT: 28 % — ABNORMAL LOW (ref 39.0–52.0)
Hemoglobin: 9.5 g/dL — ABNORMAL LOW (ref 13.0–17.0)
O2 SAT: 72 %
PCO2 VEN: 41.2 mmHg — AB (ref 44.0–60.0)
PH VEN: 7.267 (ref 7.250–7.430)
PO2 VEN: 43 mmHg (ref 32.0–45.0)
Potassium: >8.5 mmol/L (ref 3.5–5.1)
Sodium: 137 mmol/L (ref 135–145)
TCO2: 20 mmol/L — ABNORMAL LOW (ref 22–32)

## 2018-08-01 LAB — I-STAT TROPONIN, ED: Troponin i, poc: 0.02 ng/mL (ref 0.00–0.08)

## 2018-08-01 LAB — BASIC METABOLIC PANEL
Anion gap: 16 — ABNORMAL HIGH (ref 5–15)
BUN: 119 mg/dL — ABNORMAL HIGH (ref 8–23)
CO2: 18 mmol/L — ABNORMAL LOW (ref 22–32)
CREATININE: 18.96 mg/dL — AB (ref 0.61–1.24)
Calcium: 7.7 mg/dL — ABNORMAL LOW (ref 8.9–10.3)
Chloride: 105 mmol/L (ref 98–111)
GFR calc Af Amer: 3 mL/min — ABNORMAL LOW (ref >60)
GFR calc non Af Amer: 2 mL/min — ABNORMAL LOW (ref >60)
Glucose, Bld: 70 mg/dL (ref 70–99)
Potassium: >7.5 mmol/L (ref 3.5–5.1)
SODIUM: 139 mmol/L (ref 135–145)

## 2018-08-01 IMAGING — DX DG CHEST 1V PORT
2 series · 2 of 2 positions shown · non-contrast
Comparison: [DATE], [DATE]

CLINICAL DATA: Shortness of breath

EXAM:
PORTABLE CHEST 1 VIEW

[chest ap (1 of 2)]
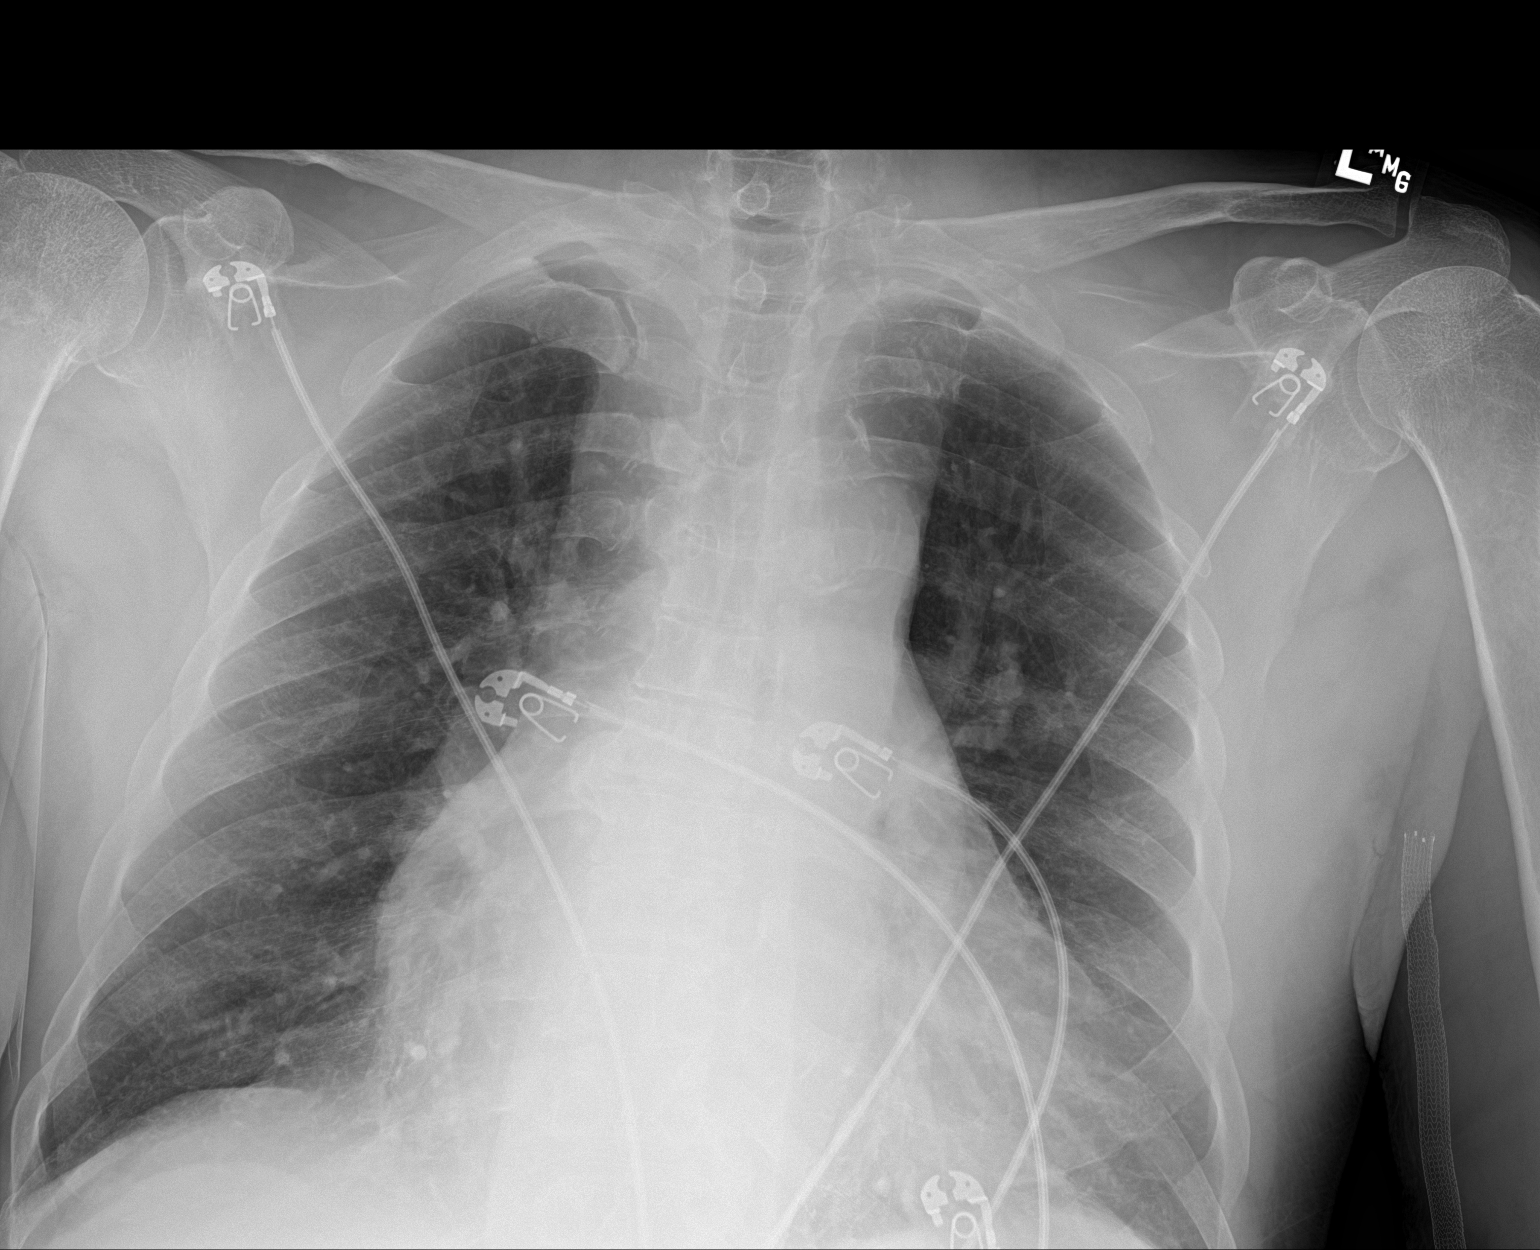

[chest ap (2 of 2)]
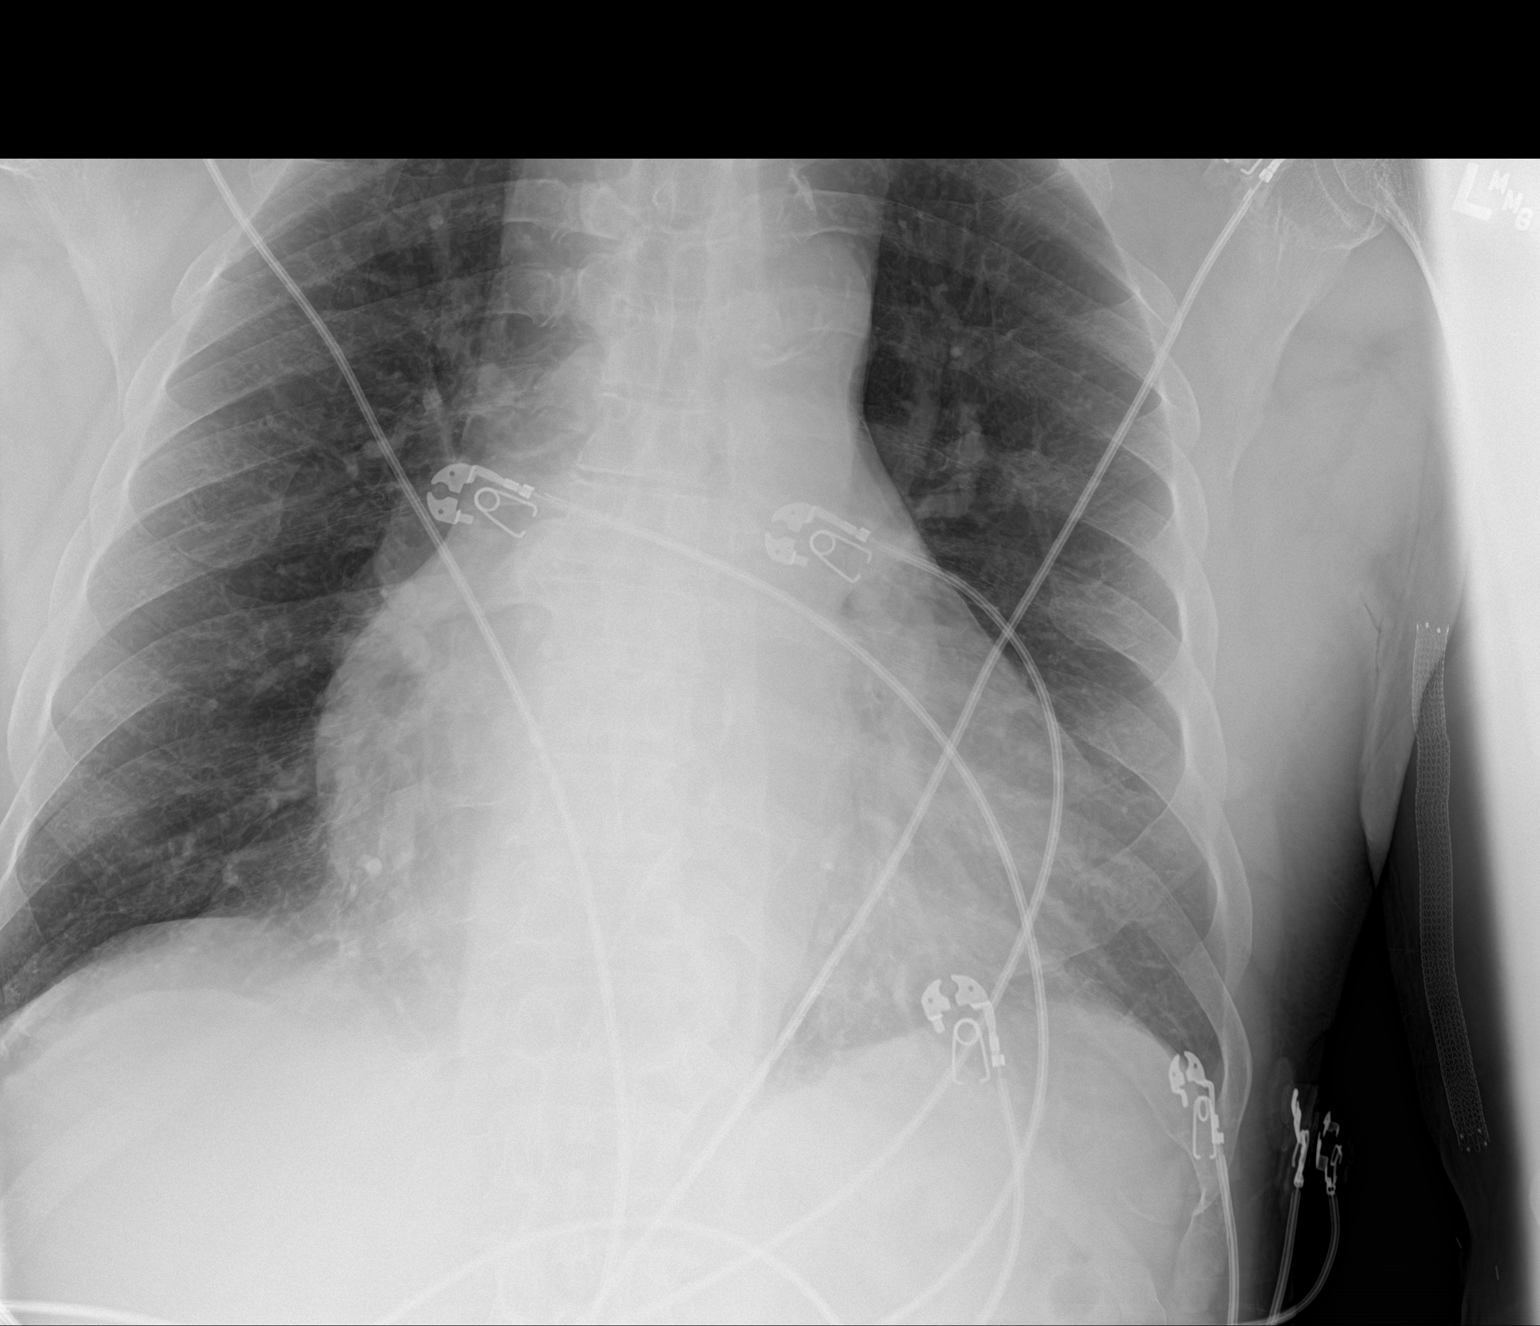

[2 of 2 positions shown; findings below may reference images not displayed]

FINDINGS: Cardiomegaly with aortic atherosclerosis. No consolidation or
effusion. No pneumothorax.
IMPRESSION: No active disease.  Cardiomegaly

## 2018-08-01 MED ORDER — ACETAMINOPHEN 325 MG PO TABS: 650.0000 mg | ORAL_TABLET | Freq: Four times a day (QID) | ORAL | Status: DC | PRN

## 2018-08-01 MED ORDER — SEVELAMER CARBONATE 800 MG PO TABS
4800.0000 mg | ORAL_TABLET | Freq: Three times a day (TID) | ORAL | Status: DC
  Administered 2018-08-02 – 2018-08-04 (×7): 4800 mg via ORAL
  Filled 2018-08-01 (×7): qty 6

## 2018-08-01 MED ORDER — SODIUM BICARBONATE 8.4 % IV SOLN
50.0000 meq | Freq: Once | INTRAVENOUS | Status: AC
  Administered 2018-08-01: 50 meq via INTRAVENOUS
  Filled 2018-08-01: qty 50

## 2018-08-01 MED ORDER — CINACALCET HCL 30 MG PO TABS
30.0000 mg | ORAL_TABLET | Freq: Every day | ORAL | Status: DC
  Administered 2018-08-02 – 2018-08-04 (×3): 30 mg via ORAL
  Filled 2018-08-01 (×4): qty 1

## 2018-08-01 MED ORDER — CALCITRIOL 0.25 MCG PO CAPS: 0.7500 ug | ORAL_CAPSULE | ORAL | Status: DC

## 2018-08-01 MED ORDER — METHYLPREDNISOLONE SODIUM SUCC 125 MG IJ SOLR: 125.0000 mg | Freq: Once | INTRAMUSCULAR | Status: DC

## 2018-08-01 MED ORDER — RENA-VITE PO TABS
1.0000 | ORAL_TABLET | Freq: Every day | ORAL | Status: DC
  Administered 2018-08-02 – 2018-08-03 (×2): 1 via ORAL
  Filled 2018-08-01 (×2): qty 1

## 2018-08-01 MED ORDER — SODIUM CHLORIDE 0.9 % IV SOLN
1.0000 g | Freq: Once | INTRAVENOUS | Status: AC
  Administered 2018-08-01: 1 g via INTRAVENOUS
  Filled 2018-08-01: qty 10

## 2018-08-01 MED ORDER — ONDANSETRON HCL 4 MG PO TABS: 4.0000 mg | ORAL_TABLET | Freq: Three times a day (TID) | ORAL | Status: DC | PRN

## 2018-08-01 MED ORDER — METOPROLOL SUCCINATE ER 50 MG PO TB24
50.0000 mg | ORAL_TABLET | Freq: Two times a day (BID) | ORAL | Status: DC
  Administered 2018-08-02 – 2018-08-04 (×5): 50 mg via ORAL
  Filled 2018-08-01 (×5): qty 1

## 2018-08-01 MED ORDER — AMLODIPINE BESYLATE 5 MG PO TABS
2.5000 mg | ORAL_TABLET | Freq: Every day | ORAL | Status: DC
  Administered 2018-08-02 – 2018-08-04 (×3): 2.5 mg via ORAL
  Filled 2018-08-01 (×3): qty 1

## 2018-08-01 MED ORDER — ALBUTEROL SULFATE (2.5 MG/3ML) 0.083% IN NEBU: 2.5000 mg | INHALATION_SOLUTION | Freq: Four times a day (QID) | RESPIRATORY_TRACT | Status: DC | PRN

## 2018-08-01 MED ORDER — ALBUTEROL SULFATE (2.5 MG/3ML) 0.083% IN NEBU
5.0000 mg | INHALATION_SOLUTION | Freq: Once | RESPIRATORY_TRACT | Status: AC
  Administered 2018-08-01: 5 mg via RESPIRATORY_TRACT
  Filled 2018-08-01: qty 6

## 2018-08-01 MED ORDER — ATORVASTATIN CALCIUM 40 MG PO TABS
40.0000 mg | ORAL_TABLET | Freq: Every day | ORAL | Status: DC
  Administered 2018-08-02 – 2018-08-04 (×3): 40 mg via ORAL
  Filled 2018-08-01 (×3): qty 1

## 2018-08-01 MED ORDER — METHYLPREDNISOLONE SODIUM SUCC 125 MG IJ SOLR
125.0000 mg | Freq: Once | INTRAMUSCULAR | Status: AC
  Administered 2018-08-01: 125 mg via INTRAVENOUS
  Filled 2018-08-01: qty 2

## 2018-08-01 MED ORDER — AMIODARONE HCL 200 MG PO TABS
200.0000 mg | ORAL_TABLET | Freq: Every day | ORAL | Status: DC
  Administered 2018-08-02 – 2018-08-04 (×3): 200 mg via ORAL
  Filled 2018-08-01 (×3): qty 1

## 2018-08-01 MED ORDER — SODIUM POLYSTYRENE SULFONATE 15 GM/60ML PO SUSP
30.0000 g | Freq: Once | ORAL | Status: AC
  Administered 2018-08-01: 30 g via ORAL
  Filled 2018-08-01: qty 120

## 2018-08-01 NOTE — H&P (Signed)
History and Physical    Raymond [name]:[id number] DOB: [birth year] DOA: 08/01/2018  PCP: Scot Jun, FNP  Patient coming from: Homeless  I have personally briefly reviewed patient's old medical records in [place]  Chief Complaint: SOB  HPI: Raymond [name] is a 63 y.o. male with medical history significant of ESRD, non-compliance with dialysis.  Normally dialized MWF, missed Monday and Wed this week, polysubstance abuse, COPD, HCV.  Patient presents to the ED with SOB, DOE.   ED Course: K > 8.5!  BUN 119, creat 18.  EKG shows peaked T waves similar to presentation with K 7.4 earlier this month.  CXR shows cardiomegaly, no active dz.   Review of Systems: As per HPI otherwise 10 point review of systems negative.   Past Medical History:  Diagnosis Date  . Anemia   . CAD (coronary artery disease)    a. 03/2018: cath showing 20 to 30% stenosis along the LAD, luminal irregularities along the RCA, and angiographically normal LCx  . COPD (chronic obstructive pulmonary disease) ([place])   . Dyspnea    "with too much fluid"  . ESRD (end stage renal disease) on dialysis [facility])    "MWF; [facility] Rinks" (07/22/17)  . Hemodialysis patient ([place])   . Hepatitis C    Still positive s/p liver biopsy at [facility]  and interferon therapy for 6 months. Most recent lab work was on 10/24/12  . Hepatitis C    "took the tx; gone now" (12/05/2016)  Was treated  . History of blood transfusion ~ 2012/2013   "related to my kidneys; blood was low"  . Hypertension   . Substance abuse ([place])   . Thyroid disease     Past Surgical History:  Procedure Laterality Date  . ABDOMINAL AORTOGRAM W/LOWER EXTREMITY N/A 02/08/2018   Procedure: ABDOMINAL AORTOGRAM W/LOWER EXTREMITY;  Surgeon: Conrad Hurley, MD;  Location: [place] [facility];  Service: Cardiovascular;  Laterality: N/A;  . AV FISTULA PLACEMENT Left Aug. 2013 ?  . FEMORAL-POPLITEAL BYPASS GRAFT Left 04/11/2018   Procedure: BYPASS GRAFT  FEMORAL-POPLITEAL ARTERY LEFT LEG;  Surgeon: Rosetta Posner, MD;  Location: [place];  Service: Vascular;  Laterality: Left;  . LEFT HEART CATH AND CORONARY ANGIOGRAPHY N/A 03/29/2018   Procedure: LEFT HEART CATH AND CORONARY ANGIOGRAPHY;  Surgeon: Nelva Bush, MD;  Location: [place] [facility];  Service: Cardiovascular;  Laterality: N/A;  . LIVER BIOPSY    . ORIF ULNAR FRACTURE Left 05/18/2017   Procedure: OPEN REDUCTION INTERNAL FIXATION (ORIF) ULNAR FRACTURE;  Surgeon: Iran Planas, MD;  Location: [place];  Service: Orthopedics;  Laterality: Left;  . SHUNTOGRAM N/A 12/11/2012   Procedure: Earney Mallet;  Surgeon: Serafina Mitchell, MD;  Location: [facility] CATH [facility];  Service: Cardiovascular;  Laterality: N/A;     reports that he has been smoking cigarettes. He has a 25.00 pack-year smoking history. He has never used smokeless tobacco. He reports current alcohol use. He reports current drug use. Drug: Cocaine.  No Known Allergies  Family History  Problem Relation Age of Onset  . Diabetes Father   . Hypertension Father   . Heart disease Father      Prior to Admission medications   Medication Sig Start Date End Date Taking? Authorizing Provider  acetaminophen (TYLENOL) 325 MG tablet Take 650 mg by mouth every 6 (six) hours as needed for mild pain.    [provider]  albuterol (PROVENTIL HFA;VENTOLIN HFA) 108 (90 Base) MCG/ACT inhaler Inhale 2  puffs into the lungs every 6 (six) hours as needed for wheezing or shortness of breath. 12/06/16   Lorella Nimrod, MD  amiodarone (PACERONE) 200 MG tablet Take 1 tablet (200 mg total) by mouth daily. 05/15/18   Isaiah Serge, NP  amLODipine (NORVASC) 2.5 MG tablet Take 1 tablet (2.5 mg total) by mouth daily. 05/15/18 08/13/18  Isaiah Serge, NP  atorvastatin (LIPITOR) 40 MG tablet Take 1 tablet (40 mg total) by mouth daily. 03/27/18 08/01/18  Isaiah Serge, NP  calcitRIOL (ROCALTROL) 0.25 MCG capsule Take 3 capsules (0.75 mcg total) by mouth every Monday,  Wednesday, and Friday with hemodialysis. 04/23/18   Waynetta Sandy, MD  cinacalcet (SENSIPAR) 30 MG tablet Take 30 mg by mouth daily.  10/12/15   [provider]  Darbepoetin Alfa (ARANESP) 100 MCG/0.5ML SOSY injection Inject 0.5 mLs (100 mcg total) into the vein every Wednesday with hemodialysis. 04/25/18   Waynetta Sandy, MD  gabapentin (NEURONTIN) 300 MG capsule Take 300 mg by mouth at bedtime as needed (sleep).    [provider]  metoprolol succinate (TOPROL-XL) 50 MG 24 hr tablet Take 1 tablet (50 mg total) by mouth daily. Take with or immediately following a meal. Patient taking differently: Take 50 mg by mouth 2 (two) times daily. Take with or immediately following a meal. 05/15/18 08/13/18  Isaiah Serge, NP  multivitamin (RENA-VIT) TABS tablet Take 1 tablet by mouth at bedtime. 04/21/18   Waynetta Sandy, MD  ondansetron (ZOFRAN) 4 MG tablet Take 1 tablet (4 mg total) by mouth every 8 (eight) hours as needed for nausea or vomiting. Patient not taking: Reported on 07/14/2018 04/03/18   Thurnell Lose, MD  sevelamer carbonate (RENVELA) 800 MG tablet Take 4,800 mg by mouth 3 (three) times daily with meals.     [provider]  warfarin (COUMADIN) 3 MG tablet Take 1 tablet (3 mg total) by mouth one time only at 6 PM. 07/15/18   Elgergawy, Silver Huguenin, MD    Physical Exam: Vitals:   08/01/18 2147 08/01/18 2148 08/01/18 2200 08/01/18 2215  BP: (!) 197/105  (!) 190/94 (!) 182/91  Pulse: 70  67 63  Resp: 16  (!) 25 (!) 23  Temp: 97.9 F (36.6 C)     TempSrc: Oral     SpO2: 100%  90% 100%  Weight:  71 kg    Height:  5\' 8"  (1.727 m)      Constitutional: NAD, calm, comfortable Eyes: PERRL, lids and conjunctivae normal ENMT: Mucous membranes are moist. Posterior pharynx clear of any exudate or lesions.Normal dentition.  Neck: normal, supple, no masses, no thyromegaly Respiratory: clear to auscultation bilaterally, no wheezing, no  crackles. Normal respiratory effort. No accessory muscle use.  Cardiovascular: Regular rate and rhythm, no murmurs / rubs / gallops. No extremity edema. 2+ pedal pulses. No carotid bruits.  Abdomen: no tenderness, no masses palpated. No hepatosplenomegaly. Bowel sounds positive.  Musculoskeletal: no clubbing / cyanosis. No joint deformity upper and lower extremities. Good ROM, no contractures. Normal muscle tone.  Skin: no rashes, lesions, ulcers. No induration Neurologic: CN 2-12 grossly intact. Sensation intact, DTR normal. Strength 5/5 in all 4.  Psychiatric: Normal judgment and insight. Alert and oriented x 3. Normal mood.    Labs on Admission: I have personally reviewed following labs and imaging studies  CBC: Recent Labs  Lab 08/01/18 2230 08/01/18 2238  WBC 3.8*  --   NEUTROABS 2.2  --   HGB  8.9* 9.5*  HCT 29.8* 28.0*  MCV 100.7*  --   PLT 106*  --    Basic Metabolic Panel: Recent Labs  Lab 08/01/18 2230 08/01/18 2238  NA 139 137  K >7.5* >8.5*  CL 105  --   CO2 18*  --   GLUCOSE 70  --   BUN 119*  --   CREATININE 18.96*  --   CALCIUM 7.7*  --    GFR: Estimated Creatinine Clearance: 3.8 mL/min (A) (by C-G formula based on SCr of 18.96 mg/dL (H)). Liver Function Tests: No results for input(s): AST, ALT, ALKPHOS, BILITOT, PROT, ALBUMIN in the last 168 hours. No results for input(s): LIPASE, AMYLASE in the last 168 hours. No results for input(s): AMMONIA in the last 168 hours. Coagulation Profile: No results for input(s): INR, PROTIME in the last 168 hours. Cardiac Enzymes: No results for input(s): CKTOTAL, CKMB, CKMBINDEX, TROPONINI in the last 168 hours. BNP (last 3 results) No results for input(s): PROBNP in the last 8760 hours. HbA1C: No results for input(s): HGBA1C in the last 72 hours. CBG: No results for input(s): GLUCAP in the last 168 hours. Lipid Profile: No results for input(s): CHOL, HDL, LDLCALC, TRIG, CHOLHDL, LDLDIRECT in the last 72  hours. Thyroid Function Tests: No results for input(s): TSH, T4TOTAL, FREET4, T3FREE, THYROIDAB in the last 72 hours. Anemia Panel: No results for input(s): VITAMINB12, FOLATE, FERRITIN, TIBC, IRON, RETICCTPCT in the last 72 hours. Urine analysis:    Component Value Date/Time   COLORURINE YELLOW 05/08/2010 [place] 05/08/2010 1335   LABSPEC 1.009 05/08/2010 1335   PHURINE 6.0 05/08/2010 1335   GLUCOSEU NEGATIVE 05/08/2010 1335   HGBUR NEGATIVE 05/08/2010 1335   BILIRUBINUR NEGATIVE 05/08/2010 1335   KETONESUR NEGATIVE 05/08/2010 1335   PROTEINUR 100 (A) 05/08/2010 1335   UROBILINOGEN 0.2 05/08/2010 1335   NITRITE NEGATIVE 05/08/2010 1335   LEUKOCYTESUR SMALL (A) 05/08/2010 1335    Radiological Exams on Admission: Dg Chest Port 1 View  Result Date: 08/01/2018 CLINICAL DATA:  Shortness of breath EXAM: PORTABLE CHEST 1 VIEW COMPARISON:  07/12/2018, 04/15/2018 FINDINGS: Cardiomegaly with aortic atherosclerosis. No consolidation or effusion. No pneumothorax. IMPRESSION: No active disease.  Cardiomegaly Electronically Signed   By: Donavan Foil M.D.   On: 08/01/2018 22:09    EKG: Independently reviewed.  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 Hyperkalemia - due to missed dialysis sessions 1. Temp measures in ED 2. Needs emergent dialysis tonight 3. Getting a-hold of Nephrology now 4. Tele monitor 5. Admit to tele bed post dialysis unit 6. Had discussion with patient about dangers of hyperkalemia at this level including risk of cardiac arrest, and not to do this again please! 2. HTN - continue home BP meds 3. COPD - cont  home nebs 4. PAF - 1. Continue / resume coumadin 2. Continue Amiodarone 5. Anemia of ESRD - 1. Management per nephrology  DVT prophylaxis: Coumadin Code Status: Full Family Communication: No family in room Disposition Plan: Home after admit, SW consult for homelessness Consults called: Nephrology Admission status: Admit to inpatient - suspect he will require multiple sessions / days of dialysis  Severity of  Illness: The appropriate patient status for this patient is INPATIENT. Inpatient status is judged to be reasonable and necessary in order to provide the required intensity of service to ensure the patient's safety. The patient's presenting symptoms, physical exam findings, and initial radiographic and laboratory data in the context of their chronic comorbidities is felt to place them at high risk for further clinical deterioration. Furthermore, it is not anticipated that the patient will be medically stable for discharge from the hospital within 2 midnights of admission. The following factors support the patient status of inpatient.   " The patient's presenting symptoms include SOB, multiple skipped dialysis sessions. " The worrisome physical exam findings include SOB. " The initial radiographic and laboratory data are worrisome because of K> 8.5, peaked T waves on EKG   * I certify that at the point of admission it is my clinical judgment that the patient will require inpatient hospital care spanning beyond 2 midnights from the point of admission due to high intensity of service, high risk for further deterioration and high frequency of surveillance required.Etta Quill DO [facility] Pager [phone] Only works nights!  If 7AM-7PM, please contact the primary day team physician taking care of patient  [email or web address] Password [facility]  08/01/2018, 11:48 PM

## 2018-08-01 NOTE — ED Triage Notes (Signed)
Pt arrives to [facility] via [facility] with new onset SOB today after missing 2 dialysis treatments (Monday and Wednesday). Pt states he is having housing issues and was unable to go. Usually goes [facility]. 100% on RA, shallow breathing 20 RR, using accessory muscles. LS clear throughout, diminished at bases. Put on 2L [place] for comfort. Very winded with minimal exertion.

## 2018-08-01 NOTE — ED Provider Notes (Signed)
[facility] EMERGENCY DEPARTMENT Provider Note   CSN: [id number] Arrival date & time: 08/01/18  2138     History   Chief Complaint Chief Complaint  Patient presents with  . Shortness of Breath    HPI Raymond [name] is a 63 y.o. male with a history of COPD, hypertension, end-stage renal disease on hemodialysis, peripheral vascular disease, paroxysmal A. fib presents to the emergency department today with chief complaint of shortness of breath.  The onset is acute, x 1 day.  He has missed his last 2 dialysis treatments.  He usually goes Monday/ Wednesday /Friday.  He reports he has missed dialysis because he does not have steady housing right now and has been staying in a motel with his friend therefore unable to get to dialysis.  Patient reports associated symptoms of chest pain and nausea, describing it as his chest feels sore because he is working so hard to breathe.  He has not taken anything for his symptoms prior to arrival.  Denies fever, palpitations, urinary complaints, abdominal pain.   Past Medical History:  Diagnosis Date  . Anemia   . CAD (coronary artery disease)    a. 03/2018: cath showing 20 to 30% stenosis along the LAD, luminal irregularities along the RCA, and angiographically normal LCx  . COPD (chronic obstructive pulmonary disease) ([place])   . Dyspnea    "with too much fluid"  . ESRD (end stage renal disease) on dialysis [facility])    "MWF; Jeneen Rinks" (07/22/17)  . Hemodialysis patient ([place])   . Hepatitis C    Still positive s/p liver biopsy at [facility]  and interferon therapy for 6 months. Most recent lab work was on 10/24/12  . Hepatitis C    "took the tx; gone now" (12/05/2016)  Was treated  . History of blood transfusion ~ 2012/2013   "related to my kidneys; blood was low"  . Hypertension   . Substance abuse ([place])   . Thyroid disease     Patient Active Problem List   Diagnosis Date Noted  . PAF (paroxysmal atrial fibrillation) ([place])  08/01/2018  . Acute respiratory failure with hypoxia ([place]) 07/13/2018  . Noncompliance of patient with renal dialysis ([place]) 07/13/2018  . PAD (peripheral artery disease) ([place]) 04/11/2018  . Sepsis ([place]) 03/31/2018  . Preop cardiovascular exam 03/29/2018  . Volume overload 08/07/2017  . Acute pulmonary edema (H[facility])   . Dyspnea 06/20/2017  . Acute bronchitis   . COPD (chronic obstructive pulmonary disease) ([place])   . End-stage renal disease on hemodialysis ([place])   . Hypoxia 12/05/2016  . Hyperkalemia 12/19/2015  . Hypoglycemia 12/19/2015  . Polysubstance abuse ([place]) 12/19/2015  . Homelessness 12/19/2015  . Anemia associated with stage 5 chronic renal failure ([place]) 12/19/2015  . Atypical chest pain 12/19/2013  . Atherosclerosis of native arteries of extremity with intermittent claudication ([place]) 12/04/2012  . ESRD (end stage renal disease) ([place]) 12/04/2012  . HEPATITIS C 09/07/2006  . HYPERCHOLESTEROLEMIA 09/07/2006  . HYPERTENSION, BENIGN SYSTEMIC 09/07/2006    Past Surgical History:  Procedure Laterality Date  . ABDOMINAL AORTOGRAM W/LOWER EXTREMITY N/A 02/08/2018   Procedure: ABDOMINAL AORTOGRAM W/LOWER EXTREMITY;  Surgeon: Conrad Elkhorn, MD;  Location: [place] CV LAB;  Service: Cardiovascular;  Laterality: N/A;  . AV FISTULA PLACEMENT Left Aug. 2013 ?  . FEMORAL-POPLITEAL BYPASS GRAFT Left 04/11/2018   Procedure: BYPASS GRAFT FEMORAL-POPLITEAL ARTERY LEFT LEG;  Surgeon: Rosetta Posner, MD;  Location: [place];  Service: Vascular;  Laterality: Left;  .  LEFT HEART CATH AND CORONARY ANGIOGRAPHY N/A 03/29/2018   Procedure: LEFT HEART CATH AND CORONARY ANGIOGRAPHY;  Surgeon: Nelva Bush, MD;  Location: [place] CV LAB;  Service: Cardiovascular;  Laterality: N/A;  . LIVER BIOPSY    . ORIF ULNAR FRACTURE Left 05/18/2017   Procedure: OPEN REDUCTION INTERNAL FIXATION (ORIF) ULNAR FRACTURE;  Surgeon: Iran Planas, MD;  Location: [place];  Service: Orthopedics;  Laterality: Left;  .  SHUNTOGRAM N/A 12/11/2012   Procedure: Earney Mallet;  Surgeon: Serafina Mitchell, MD;  Location: [facility] CATH LAB;  Service: Cardiovascular;  Laterality: N/A;        Home Medications    Prior to Admission medications   Medication Sig Start Date End Date Taking? Authorizing Provider  acetaminophen (TYLENOL) 325 MG tablet Take 650 mg by mouth every 6 (six) hours as needed for mild pain.    [provider]  albuterol (PROVENTIL HFA;VENTOLIN HFA) 108 (90 Base) MCG/ACT inhaler Inhale 2 puffs into the lungs every 6 (six) hours as needed for wheezing or shortness of breath. 12/06/16   Lorella Nimrod, MD  amiodarone (PACERONE) 200 MG tablet Take 1 tablet (200 mg total) by mouth daily. 05/15/18   Isaiah Serge, NP  amLODipine (NORVASC) 2.5 MG tablet Take 1 tablet (2.5 mg total) by mouth daily. 05/15/18 08/13/18  Isaiah Serge, NP  atorvastatin (LIPITOR) 40 MG tablet Take 1 tablet (40 mg total) by mouth daily. 03/27/18 08/01/18  Isaiah Serge, NP  calcitRIOL (ROCALTROL) 0.25 MCG capsule Take 3 capsules (0.75 mcg total) by mouth every Monday, Wednesday, and Friday with hemodialysis. 04/23/18   Waynetta Sandy, MD  cinacalcet (SENSIPAR) 30 MG tablet Take 30 mg by mouth daily.  10/12/15   [provider]  Darbepoetin Alfa (ARANESP) 100 MCG/0.5ML SOSY injection Inject 0.5 mLs (100 mcg total) into the vein every Wednesday with hemodialysis. 04/25/18   Waynetta Sandy, MD  gabapentin (NEURONTIN) 300 MG capsule Take 300 mg by mouth at bedtime as needed (sleep).    [provider]  metoprolol succinate (TOPROL-XL) 50 MG 24 hr tablet Take 1 tablet (50 mg total) by mouth daily. Take with or immediately following a meal. Patient taking differently: Take 50 mg by mouth 2 (two) times daily. Take with or immediately following a meal. 05/15/18 08/13/18  Isaiah Serge, NP  multivitamin (RENA-VIT) TABS tablet Take 1 tablet by mouth at bedtime. 04/21/18   Waynetta Sandy, MD    ondansetron (ZOFRAN) 4 MG tablet Take 1 tablet (4 mg total) by mouth every 8 (eight) hours as needed for nausea or vomiting. Patient not taking: Reported on 07/14/2018 04/03/18   Thurnell Lose, MD  sevelamer carbonate (RENVELA) 800 MG tablet Take 4,800 mg by mouth 3 (three) times daily with meals.     [provider]  warfarin (COUMADIN) 3 MG tablet Take 1 tablet (3 mg total) by mouth one time only at 6 PM. 07/15/18   Elgergawy, Silver Huguenin, MD    Family History Family History  Problem Relation Age of Onset  . Diabetes Father   . Hypertension Father   . Heart disease Father     Social History Social History   Tobacco Use  . Smoking status: Current Every Day Smoker    Packs/day: 1.00    Years: 25.00    Pack years: 25.00    Types: Cigarettes    Last attempt to quit: 08/01/2011    Years since quitting: 7.0  . Smokeless tobacco: Never Used  Substance Use Topics  . Alcohol use: Yes    Comment: occasionally  . Drug use: Yes    Types: Cocaine    Comment: clean x approx 2 months (07/22/17)     Allergies   Patient has no known allergies.   Review of Systems Review of Systems  Constitutional: Negative for chills and fever.  HENT: Negative for congestion, sinus pressure and sore throat.   Eyes: Negative for pain and visual disturbance.  Respiratory: Positive for shortness of breath. Negative for chest tightness and wheezing.   Cardiovascular: Positive for chest pain. Negative for palpitations and leg swelling.  Gastrointestinal: Negative for abdominal pain, diarrhea, nausea and vomiting.  Genitourinary: Negative for difficulty urinating, flank pain and hematuria.  Musculoskeletal: Negative for arthralgias, back pain and neck pain.  Skin: Negative for rash and wound.  Neurological: Negative for dizziness, syncope, weakness and headaches.  Psychiatric/Behavioral: Negative for confusion.     Physical Exam Updated Vital Signs BP (!) 197/105 (BP Location: Right Arm)    Pulse 70   Temp 97.9 F (36.6 C) (Oral)   Resp 16   Ht 5\' 8"  (1.727 m)   Wt 71 kg   SpO2 100%   BMI 23.80 kg/m   Physical Exam Constitutional:      Comments: Pt is able to speak in full sentences. Pt is using accessory muscles. No nasal flaring or pursed lip breathing. Pt in mild distress  HENT:     Head: Normocephalic and atraumatic.     Nose: Nose normal.     Mouth/Throat:     Mouth: Mucous membranes are dry.     Pharynx: Oropharynx is clear.  Eyes:     Conjunctiva/sclera: Conjunctivae normal.     Comments: EOM grossly intact  Neck:     Musculoskeletal: Normal range of motion. No muscular tenderness.  Cardiovascular:     Rate and Rhythm: Normal rate and regular rhythm.     Pulses: Normal pulses.     Heart sounds: Normal heart sounds.  Pulmonary:     Comments: Tachypneic. Breath sounds are diminished at bases. Chest:     Chest wall: No tenderness.  Abdominal:     General: There is no distension.     Palpations: Abdomen is soft.     Tenderness: There is no abdominal tenderness. There is no guarding or rebound.  Musculoskeletal: Normal range of motion.     Right lower leg: No edema.     Left lower leg: No edema.  Skin:    General: Skin is warm and dry.     Capillary Refill: Capillary refill takes less than 2 seconds.  Neurological:     General: No focal deficit present.     Mental Status: He is oriented to person, place, and time.  Psychiatric:        Mood and Affect: Mood normal.        Behavior: Behavior normal.      ED Treatments / Results  Labs (all labs ordered are listed, but only abnormal results are displayed) Labs Reviewed  CBC WITH DIFFERENTIAL/PLATELET - Abnormal; Notable for the following components:      Result Value   WBC 3.8 (*)    RBC 2.96 (*)    Hemoglobin 8.9 (*)    HCT 29.8 (*)    MCV 100.7 (*)    MCHC 29.9 (*)    RDW 15.9 (*)    Platelets 106 (*)    All other components within normal limits  BASIC  METABOLIC PANEL - Abnormal;  Notable for the following components:   Potassium >7.5 (*)    CO2 18 (*)    BUN 119 (*)    Creatinine, Ser 18.96 (*)    Calcium 7.7 (*)    GFR calc non Af Amer 2 (*)    GFR calc Af Amer 3 (*)    Anion gap 16 (*)    All other components within normal limits  POCT I-STAT EG7 - Abnormal; Notable for the following components:   pCO2, Ven 41.2 (*)    Bicarbonate 18.8 (*)    TCO2 20 (*)    Acid-base deficit 8.0 (*)    Potassium >8.5 (*)    Calcium, Ion 0.93 (*)    HCT 28.0 (*)    Hemoglobin 9.5 (*)    All other components within normal limits  PROTIME-INR  I-STAT TROPONIN, ED    EKG EKG Interpretation  Date/Time:  Wednesday August 01 2018 21:43:27 EST Ventricular Rate:  73 PR Interval:    QRS Duration: 113 QT Interval:  450 QTC Calculation: 496 R Axis:   45 Text Interpretation:  Sinus rhythm Prolonged PR interval Borderline intraventricular conduction delay Borderline prolonged QT interval No significant change since last tracing Confirmed by Wandra Arthurs ([id number]) on 08/01/2018 9:45:18 PM   Radiology Dg Chest Port 1 View  Result Date: 08/01/2018 CLINICAL DATA:  Shortness of breath EXAM: PORTABLE CHEST 1 VIEW COMPARISON:  07/12/2018, 04/15/2018 FINDINGS: Cardiomegaly with aortic atherosclerosis. No consolidation or effusion. No pneumothorax. IMPRESSION: No active disease.  Cardiomegaly Electronically Signed   By: Donavan Foil M.D.   On: 08/01/2018 22:09    Procedures Procedures (including critical care time)  Medications Ordered in ED Medications  calcium gluconate 1 g in sodium chloride 0.9 % 100 mL IVPB (has no administration in time range)  acetaminophen (TYLENOL) tablet 650 mg (has no administration in time range)  albuterol (PROVENTIL) (2.5 MG/3ML) 0.083% nebulizer solution 2.5 mg (has no administration in time range)  amiodarone (PACERONE) tablet 200 mg (has no administration in time range)  amLODipine (NORVASC) tablet 2.5 mg (has no administration in time range)    atorvastatin (LIPITOR) tablet 40 mg (has no administration in time range)  cinacalcet (SENSIPAR) tablet 30 mg (has no administration in time range)  calcitRIOL (ROCALTROL) capsule 0.75 mcg (has no administration in time range)  metoprolol succinate (TOPROL-XL) 24 hr tablet 50 mg (has no administration in time range)  multivitamin (RENA-VIT) tablet 1 tablet (has no administration in time range)  ondansetron (ZOFRAN) tablet 4 mg (has no administration in time range)  sevelamer carbonate (RENVELA) tablet 4,800 mg (has no administration in time range)  albuterol (PROVENTIL) (2.5 MG/3ML) 0.083% nebulizer solution 5 mg (5 mg Nebulization Given 08/01/18 2255)  sodium polystyrene (KAYEXALATE) 15 GM/60ML suspension 30 g (30 g Oral Given 08/01/18 2253)  sodium bicarbonate injection 50 mEq (50 mEq Intravenous Given 08/01/18 2256)  methylPREDNISolone sodium succinate (SOLU-MEDROL) 125 mg/2 mL injection 125 mg (125 mg Intravenous Given 08/01/18 2255)     Initial Impression / Assessment and Plan / ED Course  I have reviewed the triage vital signs and the nursing notes.  Pertinent labs & imaging results that were available during my care of the patient were reviewed by me and considered in my medical decision making (see chart for details).   Pt is alert and oriented. He is in mild respiratory distress with shallow breathing and accessory muscle use. While wearing nasal cannula 2L SpO2 98%. Pt has  missed 2 days of dialysis.  I-stat 7 shows potassium is >8.5 so will treat with albuterol, calcium gluconate, solumedrol, sodium bicarb, kayexalate, insulin and D50. Pt needs emergent dialysis, nephrology consulted.  EKG shows peaked T waves unchanged from prior.  Chest xray is unremarkable.   The patient was discussed with and seen by Dr. Darl Householder who agrees with the treatment plan.  At shift change care was transferred to Dr. Leonette Monarch who will consult nephrology for dialysis and admission to hospitalist.     Final  Clinical Impressions(s) / ED Diagnoses   Final diagnoses:  Hyperkalemia  COPD exacerbation [facility])    ED Discharge Orders    None       Flint Melter 08/02/18 0035    Drenda Freeze, MD 08/02/18 [phone]

## 2018-08-02 DIAGNOSIS — F17210 Nicotine dependence, cigarettes, uncomplicated: Secondary | ICD-10-CM | POA: Diagnosis present

## 2018-08-02 DIAGNOSIS — Z833 Family history of diabetes mellitus: Secondary | ICD-10-CM | POA: Diagnosis not present

## 2018-08-02 DIAGNOSIS — Z79899 Other long term (current) drug therapy: Secondary | ICD-10-CM | POA: Diagnosis not present

## 2018-08-02 DIAGNOSIS — Z992 Dependence on renal dialysis: Secondary | ICD-10-CM | POA: Diagnosis not present

## 2018-08-02 DIAGNOSIS — E875 Hyperkalemia: Secondary | ICD-10-CM | POA: Diagnosis not present

## 2018-08-02 DIAGNOSIS — Z8249 Family history of ischemic heart disease and other diseases of the circulatory system: Secondary | ICD-10-CM | POA: Diagnosis not present

## 2018-08-02 DIAGNOSIS — E785 Hyperlipidemia, unspecified: Secondary | ICD-10-CM | POA: Diagnosis present

## 2018-08-02 DIAGNOSIS — Z7901 Long term (current) use of anticoagulants: Secondary | ICD-10-CM | POA: Diagnosis not present

## 2018-08-02 DIAGNOSIS — N186 End stage renal disease: Secondary | ICD-10-CM | POA: Diagnosis not present

## 2018-08-02 DIAGNOSIS — D631 Anemia in chronic kidney disease: Secondary | ICD-10-CM | POA: Diagnosis not present

## 2018-08-02 DIAGNOSIS — I739 Peripheral vascular disease, unspecified: Secondary | ICD-10-CM | POA: Diagnosis present

## 2018-08-02 DIAGNOSIS — I2510 Atherosclerotic heart disease of native coronary artery without angina pectoris: Secondary | ICD-10-CM | POA: Diagnosis present

## 2018-08-02 DIAGNOSIS — I120 Hypertensive chronic kidney disease with stage 5 chronic kidney disease or end stage renal disease: Secondary | ICD-10-CM | POA: Diagnosis not present

## 2018-08-02 DIAGNOSIS — Z9115 Patient's noncompliance with renal dialysis: Secondary | ICD-10-CM | POA: Diagnosis not present

## 2018-08-02 DIAGNOSIS — B1920 Unspecified viral hepatitis C without hepatic coma: Secondary | ICD-10-CM | POA: Diagnosis present

## 2018-08-02 DIAGNOSIS — I480 Paroxysmal atrial fibrillation: Secondary | ICD-10-CM | POA: Diagnosis present

## 2018-08-02 DIAGNOSIS — K7460 Unspecified cirrhosis of liver: Secondary | ICD-10-CM | POA: Diagnosis present

## 2018-08-02 DIAGNOSIS — E1122 Type 2 diabetes mellitus with diabetic chronic kidney disease: Secondary | ICD-10-CM | POA: Diagnosis present

## 2018-08-02 DIAGNOSIS — E8770 Fluid overload, unspecified: Secondary | ICD-10-CM | POA: Diagnosis present

## 2018-08-02 DIAGNOSIS — J9601 Acute respiratory failure with hypoxia: Secondary | ICD-10-CM | POA: Diagnosis present

## 2018-08-02 DIAGNOSIS — D61818 Other pancytopenia: Secondary | ICD-10-CM | POA: Diagnosis present

## 2018-08-02 DIAGNOSIS — Z590 Homelessness: Secondary | ICD-10-CM | POA: Diagnosis not present

## 2018-08-02 DIAGNOSIS — N2581 Secondary hyperparathyroidism of renal origin: Secondary | ICD-10-CM | POA: Diagnosis not present

## 2018-08-02 DIAGNOSIS — J441 Chronic obstructive pulmonary disease with (acute) exacerbation: Secondary | ICD-10-CM | POA: Diagnosis present

## 2018-08-02 LAB — CBC
HEMATOCRIT: 32.8 % — AB (ref 39.0–52.0)
Hemoglobin: 10.3 g/dL — ABNORMAL LOW (ref 13.0–17.0)
MCH: 30.3 pg (ref 26.0–34.0)
MCHC: 31.4 g/dL (ref 30.0–36.0)
MCV: 96.5 fL (ref 80.0–100.0)
Platelets: 107 10*3/uL — ABNORMAL LOW (ref 150–400)
RBC: 3.4 MIL/uL — ABNORMAL LOW (ref 4.22–5.81)
RDW: 15.7 % — ABNORMAL HIGH (ref 11.5–15.5)
WBC: 1.3 10*3/uL — CL (ref 4.0–10.5)
nRBC: 0 % (ref 0.0–0.2)

## 2018-08-02 LAB — RENAL FUNCTION PANEL
Albumin: 3.9 g/dL (ref 3.5–5.0)
Anion gap: 18 — ABNORMAL HIGH (ref 5–15)
BUN: 41 mg/dL — ABNORMAL HIGH (ref 8–23)
CHLORIDE: 95 mmol/L — AB (ref 98–111)
CO2: 23 mmol/L (ref 22–32)
Calcium: 8.7 mg/dL — ABNORMAL LOW (ref 8.9–10.3)
Creatinine, Ser: 9.98 mg/dL — ABNORMAL HIGH (ref 0.61–1.24)
GFR calc Af Amer: 6 mL/min — ABNORMAL LOW (ref >60)
GFR calc non Af Amer: 5 mL/min — ABNORMAL LOW (ref >60)
Glucose, Bld: 83 mg/dL (ref 70–99)
Phosphorus: 5.5 mg/dL — ABNORMAL HIGH (ref 2.5–4.6)
Potassium: 5 mmol/L (ref 3.5–5.1)
Sodium: 136 mmol/L (ref 135–145)

## 2018-08-02 LAB — BASIC METABOLIC PANEL
Anion gap: 16 — ABNORMAL HIGH (ref 5–15)
Anion gap: 16 — ABNORMAL HIGH (ref 5–15)
BUN: 71 mg/dL — ABNORMAL HIGH (ref 8–23)
BUN: 45 mg/dL — ABNORMAL HIGH (ref 8–23)
CO2: 21 mmol/L — ABNORMAL LOW (ref 22–32)
CO2: 25 mmol/L (ref 22–32)
Calcium: 8.5 mg/dL — ABNORMAL LOW (ref 8.9–10.3)
Calcium: 8.3 mg/dL — ABNORMAL LOW (ref 8.9–10.3)
Chloride: 102 mmol/L (ref 98–111)
Chloride: 94 mmol/L — ABNORMAL LOW (ref 98–111)
Creatinine, Ser: 11.98 mg/dL — ABNORMAL HIGH (ref 0.61–1.24)
Creatinine, Ser: 10.57 mg/dL — ABNORMAL HIGH (ref 0.61–1.24)
GFR calc Af Amer: 5 mL/min — ABNORMAL LOW (ref >60)
GFR calc Af Amer: 5 mL/min — ABNORMAL LOW (ref >60)
GFR calc non Af Amer: 4 mL/min — ABNORMAL LOW (ref >60)
GFR calc non Af Amer: 5 mL/min — ABNORMAL LOW (ref >60)
Glucose, Bld: 83 mg/dL (ref 70–99)
Glucose, Bld: 132 mg/dL — ABNORMAL HIGH (ref 70–99)
Potassium: 5.1 mmol/L (ref 3.5–5.1)
Potassium: 5.3 mmol/L — ABNORMAL HIGH (ref 3.5–5.1)
SODIUM: 139 mmol/L (ref 135–145)
Sodium: 135 mmol/L (ref 135–145)

## 2018-08-02 LAB — PROTIME-INR
INR: 1.21
PROTHROMBIN TIME: 15.2 s (ref 11.4–15.2)

## 2018-08-02 LAB — HEPARIN LEVEL (UNFRACTIONATED)
Heparin Unfractionated: <0.1 IU/mL — ABNORMAL LOW (ref 0.30–0.70)
Heparin Unfractionated: 0.46 IU/mL (ref 0.30–0.70)

## 2018-08-02 MED ORDER — CHLORHEXIDINE GLUCONATE CLOTH 2 % EX PADS: 6.0000 | MEDICATED_PAD | Freq: Every day | CUTANEOUS | Status: DC

## 2018-08-02 MED ORDER — ONDANSETRON HCL 4 MG PO TABS: 4.0000 mg | ORAL_TABLET | Freq: Four times a day (QID) | ORAL | Status: DC | PRN

## 2018-08-02 MED ORDER — PENTAFLUOROPROP-TETRAFLUOROETH EX AERO: 1.0000 "application " | INHALATION_SPRAY | CUTANEOUS | Status: DC | PRN

## 2018-08-02 MED ORDER — SODIUM CHLORIDE 0.9 % IV SOLN: 100.0000 mL | INTRAVENOUS | Status: DC | PRN

## 2018-08-02 MED ORDER — INSULIN ASPART 100 UNIT/ML ~~LOC~~ SOLN: 5.0000 [IU] | Freq: Once | SUBCUTANEOUS | Status: DC

## 2018-08-02 MED ORDER — DEXTROSE 50 % IV SOLN: 50.0000 mL | Freq: Once | INTRAVENOUS | Status: DC

## 2018-08-02 MED ORDER — WARFARIN SODIUM 5 MG PO TABS
5.0000 mg | ORAL_TABLET | Freq: Once | ORAL | Status: AC
  Administered 2018-08-02: 5 mg via ORAL
  Filled 2018-08-02: qty 1

## 2018-08-02 MED ORDER — ALTEPLASE 2 MG IJ SOLR: 2.0000 mg | Freq: Once | INTRAMUSCULAR | Status: DC | PRN

## 2018-08-02 MED ORDER — LIDOCAINE HCL (PF) 1 % IJ SOLN: 5.0000 mL | INTRAMUSCULAR | Status: DC | PRN

## 2018-08-02 MED ORDER — CALCITRIOL 0.25 MCG PO CAPS
1.0000 ug | ORAL_CAPSULE | ORAL | Status: DC
  Administered 2018-08-03: 1 ug via ORAL

## 2018-08-02 MED ORDER — HEPARIN (PORCINE) 25000 UT/250ML-% IV SOLN
1200.0000 [IU]/h | INTRAVENOUS | Status: DC
  Administered 2018-08-02 – 2018-08-04 (×3): 1200 [IU]/h via INTRAVENOUS
  Filled 2018-08-02 (×3): qty 250

## 2018-08-02 MED ORDER — LIDOCAINE-PRILOCAINE 2.5-2.5 % EX CREA: 1.0000 "application " | TOPICAL_CREAM | CUTANEOUS | Status: DC | PRN

## 2018-08-02 MED ORDER — ACETAMINOPHEN 325 MG PO TABS: 650.0000 mg | ORAL_TABLET | Freq: Four times a day (QID) | ORAL | Status: DC | PRN

## 2018-08-02 MED ORDER — ONDANSETRON HCL 4 MG/2ML IJ SOLN: 4.0000 mg | Freq: Four times a day (QID) | INTRAMUSCULAR | Status: DC | PRN

## 2018-08-02 MED ORDER — DARBEPOETIN ALFA 60 MCG/0.3ML IJ SOSY
60.0000 ug | PREFILLED_SYRINGE | INTRAMUSCULAR | Status: DC
  Administered 2018-08-03: 60 ug via INTRAVENOUS
  Filled 2018-08-02: qty 0.3

## 2018-08-02 MED ORDER — HEPARIN SODIUM (PORCINE) 1000 UNIT/ML DIALYSIS
20.0000 [IU]/kg | INTRAMUSCULAR | Status: DC | PRN
  Filled 2018-08-02: qty 2

## 2018-08-02 MED ORDER — HEPARIN SODIUM (PORCINE) 1000 UNIT/ML DIALYSIS
1000.0000 [IU] | INTRAMUSCULAR | Status: DC | PRN
  Filled 2018-08-02: qty 1

## 2018-08-02 MED ORDER — ACETAMINOPHEN 650 MG RE SUPP: 650.0000 mg | Freq: Four times a day (QID) | RECTAL | Status: DC | PRN

## 2018-08-02 MED ORDER — LIDOCAINE-PRILOCAINE 2.5-2.5 % EX CREA
1.0000 "application " | TOPICAL_CREAM | CUTANEOUS | Status: DC | PRN
  Filled 2018-08-02: qty 5

## 2018-08-02 MED ORDER — CHLORHEXIDINE GLUCONATE CLOTH 2 % EX PADS
6.0000 | MEDICATED_PAD | Freq: Every day | CUTANEOUS | Status: DC
  Administered 2018-08-02: 6 via TOPICAL

## 2018-08-02 MED ORDER — HEPARIN SODIUM (PORCINE) 1000 UNIT/ML DIALYSIS: 1000.0000 [IU] | INTRAMUSCULAR | Status: DC | PRN

## 2018-08-02 MED ORDER — WARFARIN - PHARMACIST DOSING INPATIENT
Freq: Every day | Status: DC
  Administered 2018-08-03: 17:00:00

## 2018-08-02 MED ORDER — WARFARIN SODIUM 5 MG PO TABS
5.0000 mg | ORAL_TABLET | Freq: Once | ORAL | Status: DC
  Filled 2018-08-02: qty 1

## 2018-08-02 MED ORDER — CHLORHEXIDINE GLUCONATE CLOTH 2 % EX PADS
6.0000 | MEDICATED_PAD | Freq: Every day | CUTANEOUS | Status: DC
  Administered 2018-08-03: 6 via TOPICAL

## 2018-08-02 NOTE — ED Notes (Signed)
Repaged nephrology to [facility]

## 2018-08-02 NOTE — Progress Notes (Signed)
[facility] for Coumadin, heparin  Indication: atrial fibrillation  No Known Allergies  Patient Measurements: Height: 5\' 8"  (172.7 cm) Weight: 159 lb 9.8 oz (72.4 kg) IBW/kg (Calculated) : 68.4  Vital Signs: Temp: 97.8 F (36.6 C) (01/23 0616) Temp Source: Oral (01/23 0616) BP: 147/92 (01/23 0957) Pulse Rate: 89 (01/23 0900)  Labs: Recent Labs    08/01/18 2230 08/01/18 2238 08/01/18 2350 08/02/18 0252 08/02/18 0731  HGB 8.9* 9.5*  --   --  10.3*  HCT 29.8* 28.0*  --   --  32.8*  PLT 106*  --   --   --  107*  LABPROT  --   --  15.2  --   --   INR  --   --  1.21  --   --   HEPARINUNFRC  --   --   --   --  <0.10*  CREATININE 18.96*  --   --  11.98* 9.98*    Estimated Creatinine Clearance: 7.2 mL/min (A) (by C-G formula based on SCr of 9.98 mg/dL (H)).   Medical History: Past Medical History:  Diagnosis Date  . Anemia   . CAD (coronary artery disease)    a. 03/2018: cath showing 20 to 30% stenosis along the LAD, luminal irregularities along the RCA, and angiographically normal LCx  . COPD (chronic obstructive pulmonary disease) ([place])   . Dyspnea    "with too much fluid"  . ESRD (end stage renal disease) on dialysis [facility])    "MWF; Jeneen Rinks" (07/22/17)  . Hemodialysis patient ([place])   . Hepatitis C    Still positive s/p liver biopsy at [facility]  and interferon therapy for 6 months. Most recent lab work was on 10/24/12  . Hepatitis C    "took the tx; gone now" (12/05/2016)  Was treated  . History of blood transfusion ~ 2012/2013   "related to my kidneys; blood was low"  . Hypertension   . Substance abuse ([place])   . Thyroid disease     Medications:  Current Facility-Administered Medications on File Prior to Encounter  Medication Dose Route Frequency Provider Last Rate Last Dose  . midazolam (VERSED) 5 MG/5ML injection    Anesthesia Intra-op Sammie Bench, CRNA   2 mg at 04/11/18 1042   Current Outpatient Medications on  File Prior to Encounter  Medication Sig Dispense Refill  . acetaminophen (TYLENOL) 325 MG tablet Take 650 mg by mouth every 6 (six) hours as needed for mild pain.    [name] amiodarone (PACERONE) 200 MG tablet Take 1 tablet (200 mg total) by mouth daily. 90 tablet 3  . amLODipine (NORVASC) 2.5 MG tablet Take 1 tablet (2.5 mg total) by mouth daily. 90 tablet 3  . metoprolol succinate (TOPROL-XL) 50 MG 24 hr tablet Take 1 tablet (50 mg total) by mouth daily. Take with or immediately following a meal. (Patient taking differently: Take 50 mg by mouth 2 (two) times daily. Take with or immediately following a meal.) 90 tablet 3  . sevelamer carbonate (RENVELA) 800 MG tablet Take 4,800 mg by mouth 3 (three) times daily with meals.     [name] albuterol (PROVENTIL HFA;VENTOLIN HFA) 108 (90 Base) MCG/ACT inhaler Inhale 2 puffs into the lungs every 6 (six) hours as needed for wheezing or shortness of breath. (Patient not taking: Reported on 08/02/2018) 1 Inhaler 2  . atorvastatin (LIPITOR) 40 MG tablet Take 1 tablet (40 mg total) by mouth daily. (Patient not taking: Reported on  08/02/2018) 90 tablet 3  . calcitRIOL (ROCALTROL) 0.25 MCG capsule Take 3 capsules (0.75 mcg total) by mouth every Monday, Wednesday, and Friday with hemodialysis. (Patient not taking: Reported on 08/02/2018) 30 capsule 3  . Darbepoetin Alfa (ARANESP) 100 MCG/0.5ML SOSY injection Inject 0.5 mLs (100 mcg total) into the vein every Wednesday with hemodialysis. (Patient not taking: Reported on 08/02/2018) 4.2 mL 3  . multivitamin (RENA-VIT) TABS tablet Take 1 tablet by mouth at bedtime. (Patient not taking: Reported on 08/02/2018) 30 tablet 3  . ondansetron (ZOFRAN) 4 MG tablet Take 1 tablet (4 mg total) by mouth every 8 (eight) hours as needed for nausea or vomiting. (Patient not taking: Reported on 07/14/2018) 20 tablet 0  . warfarin (COUMADIN) 3 MG tablet Take 1 tablet (3 mg total) by mouth one time only at 6 PM. (Patient not taking: Reported on 08/02/2018)  10 tablet 0  . [DISCONTINUED] cinacalcet (SENSIPAR) 30 MG tablet Take 30 mg by mouth daily.     . [DISCONTINUED] gabapentin (NEURONTIN) 300 MG capsule Take 300 mg by mouth at bedtime as needed (sleep).       Assessment: 63 y.o. male with SOB and hyperkalemia due to missed HD.  He is on coumadin PTA for afib. Pharmacy dosing coumadin and heparin. He went to HD early this morning and the heparin infusion was delayed -INR= 1.21, hg= 10.3, plt= 107 (hisory of low plt)  Goal of Therapy:  INR 2-3 Monitor platelets by anticoagulation protocol: Yes   Plan:  -Start heparin at 12000 units/hr -Heparin level in 8 hours and daily wth CBC daily  Hildred Laser, PharmD Clinical Pharmacist **Pharmacist phone directory can now be found on [email or web address] ([email or web address]).  Listed under [place].

## 2018-08-02 NOTE — Progress Notes (Signed)
HD tx completed @ 2125 w/o problem UF goal met Blood rinsed back VSS Report called to [place], [place]

## 2018-08-02 NOTE — Progress Notes (Signed)
HD tx initiated via 15Gx2 w/o problem Pull/push/flush well w/o problem VSS Will continue to monitor while on HD tx 

## 2018-08-02 NOTE — ED Provider Notes (Signed)
We were able to get a hold of nephrology who arranged for emergent dialysis.  Patient was admitted to the hospital service following dialysis   Fatima Blank, MD 08/02/18 [phone]

## 2018-08-02 NOTE — Progress Notes (Signed)
Date and time results received: 08/02/18 731 (use smartphrase ".now" to insert current time)  Test: WBC Critical Value: 1.3 Name of Provider Notified: Dr. Verlon Au  Orders Received? Or Actions Taken?:Awaiting orders

## 2018-08-02 NOTE — Consult Note (Addendum)
Reason for Consult: To manage dialysis and dialysis related needs Referring Physician: Dr. Darl Householder [name] is an 63 y.o. male.  HPI: [name] with a PMH sig for HTN. HLD, ESRD MWF at [facility], Hep C s/p treatment, paroxysmal Afib, CAD, PAD who is now seen in consultation at the request of Drs. Darl Householder and Research scientist (life sciences) for management of ESRD and provision of HD.  Pt has a h/o of missed rx with dialysis.  He has missed his last 2 treatments due to his living situation becoming unstable.  His last treatment was Friday.  He presented to the ED today for evaluation of SOB and dyspnea on exertion.  He was noted to be hypertensive to 180s/110s with K > 8.5, BUN 119, Cr 18.96, CO2 18, Ca 7.7.  In this setting we are asked to see  Review of the OP record notes multiple missed treatments and early sign-offs.  K was 7 as OP on 1/8.  Pt reports some SOB but denies HA, f/c, n/v, CP.    Dialyzes at [facility] MWF 4 hrs EDW 71 kg 2K/ 2.5 Ca F180 dialyzer BFR 450 mL/ min UF profile 4, no sodium modeling Heparin 2000 u bolus Mircera 100 mcg IVP q 2 weeks, to start 08/01/18 but not given Calcitriol 1.5 mcg q rx Parsabiv 10 mg q rx   Past Medical History:  Diagnosis Date  . Anemia   . CAD (coronary artery disease)    a. 03/2018: cath showing 20 to 30% stenosis along the LAD, luminal irregularities along the RCA, and angiographically normal LCx  . COPD (chronic obstructive pulmonary disease) ([place])   . Dyspnea    "with too much fluid"  . ESRD (end stage renal disease) on dialysis [facility])    "MWF; Jeneen Rinks" (07/22/17)  . Hemodialysis patient ([place])   . Hepatitis C    Still positive s/p liver biopsy at [facility]  and interferon therapy for 6 months. Most recent lab work was on 10/24/12  . Hepatitis C    "took the tx; gone now" (12/05/2016)  Was treated  . History of blood transfusion ~ 2012/2013   "related to my kidneys; blood was low"  . Hypertension   . Substance abuse ([place])   . Thyroid disease     Past  Surgical History:  Procedure Laterality Date  . ABDOMINAL AORTOGRAM W/LOWER EXTREMITY N/A 02/08/2018   Procedure: ABDOMINAL AORTOGRAM W/LOWER EXTREMITY;  Surgeon: Conrad Bloomfield, MD;  Location: [place] CV LAB;  Service: Cardiovascular;  Laterality: N/A;  . AV FISTULA PLACEMENT Left Aug. 2013 ?  . FEMORAL-POPLITEAL BYPASS GRAFT Left 04/11/2018   Procedure: BYPASS GRAFT FEMORAL-POPLITEAL ARTERY LEFT LEG;  Surgeon: Rosetta Posner, MD;  Location: [place];  Service: Vascular;  Laterality: Left;  . LEFT HEART CATH AND CORONARY ANGIOGRAPHY N/A 03/29/2018   Procedure: LEFT HEART CATH AND CORONARY ANGIOGRAPHY;  Surgeon: Nelva Bush, MD;  Location: [place] CV [facility];  Service: Cardiovascular;  Laterality: N/A;  . LIVER BIOPSY    . ORIF ULNAR FRACTURE Left 05/18/2017   Procedure: OPEN REDUCTION INTERNAL FIXATION (ORIF) ULNAR FRACTURE;  Surgeon: Iran Planas, MD;  Location: [place];  Service: Orthopedics;  Laterality: Left;  . SHUNTOGRAM N/A 12/11/2012   Procedure: Earney Mallet;  Surgeon: Serafina Mitchell, MD;  Location: [facility] CATH LAB;  Service: Cardiovascular;  Laterality: N/A;    Family History  Problem Relation Age of Onset  . Diabetes Father   . Hypertension Father   .  Heart disease Father     Social History:  reports that he has been smoking cigarettes. He has a 25.00 pack-year smoking history. He has never used smokeless tobacco. He reports current alcohol use. He reports current drug use. Drug: Cocaine.  Allergies: No Known Allergies  Medications:  Scheduled: . amiodarone  200 mg Oral Daily  . amLODipine  2.5 mg Oral Daily  . atorvastatin  40 mg Oral Daily  . [START ON 08/03/2018] calcitRIOL  0.75 mcg Oral Q M,W,F-HD  . Chlorhexidine Gluconate Cloth  6 each Topical Q0600  . cinacalcet  30 mg Oral Daily  . dextrose  50 mL Intravenous Once  . insulin aspart  5 Units Subcutaneous Once  . metoprolol succinate  50 mg Oral BID  . multivitamin  1 tablet Oral QHS  . sevelamer carbonate  4,800 mg  Oral TID WC  . warfarin  5 mg Oral Once  . warfarin  5 mg Oral ONCE-1800  . Warfarin - Pharmacist Dosing Inpatient   Does not apply q1800     Results for orders placed or performed during the hospital encounter of 08/01/18 (from the past 48 hour(s))  CBC with Differential/Platelet     Status: Abnormal   Collection Time: 08/01/18 10:30 PM  Result Value Ref Range   WBC 3.8 (L) 4.0 - 10.5 K/uL   RBC 2.96 (L) 4.22 - 5.81 MIL/uL   Hemoglobin 8.9 (L) 13.0 - 17.0 g/dL   HCT 29.8 (L) 39.0 - 52.0 %   MCV 100.7 (H) 80.0 - 100.0 fL   MCH 30.1 26.0 - 34.0 pg   MCHC 29.9 (L) 30.0 - 36.0 g/dL   RDW 15.9 (H) 11.5 - 15.5 %   Platelets 106 (L) 150 - 400 K/uL    Comment: REPEATED TO VERIFY Immature Platelet Fraction may be clinically indicated, consider ordering this additional test [id number] CONSISTENT WITH PREVIOUS RESULT    nRBC 0.0 0.0 - 0.2 %   Neutrophils Relative % 58 %   Neutro Abs 2.2 1.7 - 7.7 K/uL   Lymphocytes Relative 28 %   Lymphs Abs 1.1 0.7 - 4.0 K/uL   Monocytes Relative 13 %   Monocytes Absolute 0.5 0.1 - 1.0 K/uL   Eosinophils Relative 1 %   Eosinophils Absolute 0.1 0.0 - 0.5 K/uL   Basophils Relative 0 %   Basophils Absolute 0.0 0.0 - 0.1 K/uL   Immature Granulocytes 0 %   Abs Immature Granulocytes 0.01 0.00 - 0.07 K/uL    Comment: Performed at [facility], [address] [place], [place] [address]  Basic metabolic panel     Status: Abnormal   Collection Time: 08/01/18 10:30 PM  Result Value Ref Range   Sodium 139 135 - 145 mmol/L   Potassium >7.5 (HH) 3.5 - 5.1 mmol/L    Comment: CRITICAL RESULT CALLED TO, READ BACK BY AND VERIFIED WITH: BRADLEY,M RN 08/01/2018 2257 [address] NO VISIBLE HEMOLYSIS    Chloride 105 98 - 111 mmol/L   CO2 18 (L) 22 - 32 mmol/L   Glucose, Bld 70 70 - 99 mg/dL   BUN 119 (H) 8 - 23 mg/dL   Creatinine, Ser 18.96 (H) 0.61 - 1.24 mg/dL   Calcium 7.7 (L) 8.9 - 10.3 mg/dL   GFR calc non Af Amer 2 (L) >60 mL/min   GFR calc Af Amer 3  (L) >60 mL/min   Anion gap 16 (H) 5 - 15    Comment: Performed at [facility],  [address], [place], [place] [address]  I-stat troponin, ED     Status: None   Collection Time: 08/01/18 10:35 PM  Result Value Ref Range   Troponin i, poc 0.02 0.00 - 0.08 ng/mL   Comment 3            Comment: Due to the release kinetics of cTnI, a negative result within the first hours of the onset of symptoms does not rule out myocardial infarction with certainty. If myocardial infarction is still suspected, repeat the test at appropriate intervals.   POCT I-Stat EG7     Status: Abnormal   Collection Time: 08/01/18 10:38 PM  Result Value Ref Range   pH, Ven 7.267 7.250 - 7.430   pCO2, Ven 41.2 (L) 44.0 - 60.0 mmHg   pO2, Ven 43.0 32.0 - 45.0 mmHg   Bicarbonate 18.8 (L) 20.0 - 28.0 mmol/L   TCO2 20 (L) 22 - 32 mmol/L   O2 Saturation 72.0 %   Acid-base deficit 8.0 (H) 0.0 - 2.0 mmol/L   Sodium 137 135 - 145 mmol/L   Potassium >8.5 (HH) 3.5 - 5.1 mmol/L   Calcium, Ion 0.93 (L) 1.15 - 1.40 mmol/L   HCT 28.0 (L) 39.0 - 52.0 %   Hemoglobin 9.5 (L) 13.0 - 17.0 g/dL   Patient temperature HIDE    Sample type VENOUS    Comment NOTIFIED PHYSICIAN   Protime-INR     Status: None   Collection Time: 08/01/18 11:50 PM  Result Value Ref Range   Prothrombin Time 15.2 11.4 - 15.2 seconds   INR 1.21     Comment: Performed at [facility], [place] [address], [place], [place] [address]    Dg Chest Port 1 View  Result Date: 08/01/2018 CLINICAL DATA:  Shortness of breath EXAM: PORTABLE CHEST 1 VIEW COMPARISON:  07/12/2018, 04/15/2018 FINDINGS: Cardiomegaly with aortic atherosclerosis. No consolidation or effusion. No pneumothorax. IMPRESSION: No active disease.  Cardiomegaly Electronically Signed   By: Donavan Foil M.D.   On: 08/01/2018 22:09    ROS: all other systems reviewed and are negative except as per HPI Blood pressure (!) 187/110, pulse 85, temperature 97.9 F (36.6 C), temperature source  Oral, resp. rate 18, height 5\' 8"  (1.727 m), weight 71 kg, SpO2 96 %. . GEN NAD, lying in bed HEENT EOMI PERRL MMM NECK JVD to angle of mandible PULM coarse breath sounds, not requiring O2 CV RRR soft systolic murmur ABD soft, sl distended, NABS, + liver edge felt EXT 1+ LE edema NEURO AAO x3 nonfocal, no asterixis ACCESS: F FA AVF +T/B, aneurysmal, small area of thinning skin but no redness/ eschar  Assessment/Plan: 1 SOB/ hyperkalemia: due to nonadherence to dialysis regimen.  We will provide urgent dialysis tonight to correct hyperkalemia and metabolic derangements.  He will likely need multiple treatments.   2 ESRD: MWF [facility].  Will do 1K bath for 1 hr and the rest 2K bath 3 Hypertension: hypertensive to 180s/ 110s.  Expect to improve with UF.  Takes metoprolol and amlodipine as OP 4. Anemia of ESRD: Last Hgb 8.9, missed OP ESA dosing, will provide 5. Metabolic Bone Disease: calcitriol 1.5 mcg q rx, parsabiv 10 mg q rx (not available here), binder is renvela 5 TID AC 6.  Afib: on amiodarone and warfarin 7.  Nutrition: renal vitamin 8.  Dispo: discussed with hospitalist, to be admitted  Dwale, Benjamine Mola 08/02/2018, 1:35 AM  NB: please feel free to call me at [phone] if there  is anything needed.  We are having trouble with the paging system.

## 2018-08-02 NOTE — Progress Notes (Signed)
[place] for Coumadin, heparin  Indication: atrial fibrillation  No Known Allergies  Patient Measurements: Height: 5\' 8"  (172.7 cm) Weight: 154 lb 5.2 oz (70 kg)(all extras off bed including removing IV pump ) IBW/kg (Calculated) : 68.4  Vital Signs: Temp: 98.4 F (36.9 C) (01/23 1838) Temp Source: Oral (01/23 1838) BP: 106/63 (01/23 2000) Pulse Rate: 71 (01/23 2000)  Labs: Recent Labs    08/01/18 2230 08/01/18 2238 08/01/18 2350 08/02/18 0252 08/02/18 0731 08/02/18 1109 08/02/18 1945  HGB 8.9* 9.5*  --   --  10.3*  --   --   HCT 29.8* 28.0*  --   --  32.8*  --   --   PLT 106*  --   --   --  107*  --   --   LABPROT  --   --  15.2  --   --   --   --   INR  --   --  1.21  --   --   --   --   HEPARINUNFRC  --   --   --   --  <0.10*  --  0.46  CREATININE 18.96*  --   --  11.98* 9.98* 10.57*  --     Estimated Creatinine Clearance: 6.8 mL/min (A) (by C-G formula based on SCr of 10.57 mg/dL (H)).   Medical History: Past Medical History:  Diagnosis Date  . Anemia   . CAD (coronary artery disease)    a. 03/2018: cath showing 20 to 30% stenosis along the LAD, luminal irregularities along the RCA, and angiographically normal LCx  . COPD (chronic obstructive pulmonary disease) ([place])   . Dyspnea    "with too much fluid"  . ESRD (end stage renal disease) on dialysis [facility])    "MWF; Jeneen Rinks" (07/22/17)  . Hemodialysis patient ([place])   . Hepatitis C    Still positive s/p liver biopsy at [facility]  and interferon therapy for 6 months. Most recent lab work was on 10/24/12  . Hepatitis C    "took the tx; gone now" (12/05/2016)  Was treated  . History of blood transfusion ~ 2012/2013   "related to my kidneys; blood was low"  . Hypertension   . Substance abuse ([place])   . Thyroid disease     Medications:  Current Facility-Administered Medications on File Prior to Encounter  Medication Dose Route Frequency Provider Last Rate Last Dose  .  midazolam (VERSED) 5 MG/5ML injection    Anesthesia Intra-op Sammie Bench, CRNA   2 mg at 04/11/18 1042   Current Outpatient Medications on File Prior to Encounter  Medication Sig Dispense Refill  . acetaminophen (TYLENOL) 325 MG tablet Take 650 mg by mouth every 6 (six) hours as needed for mild pain.    [name] amiodarone (PACERONE) 200 MG tablet Take 1 tablet (200 mg total) by mouth daily. 90 tablet 3  . amLODipine (NORVASC) 2.5 MG tablet Take 1 tablet (2.5 mg total) by mouth daily. 90 tablet 3  . metoprolol succinate (TOPROL-XL) 50 MG 24 hr tablet Take 1 tablet (50 mg total) by mouth daily. Take with or immediately following a meal. (Patient taking differently: Take 50 mg by mouth 2 (two) times daily. Take with or immediately following a meal.) 90 tablet 3  . sevelamer carbonate (RENVELA) 800 MG tablet Take 4,800 mg by mouth 3 (three) times daily with meals.     [name] albuterol (PROVENTIL HFA;VENTOLIN HFA) 108 (90 Base)  MCG/ACT inhaler Inhale 2 puffs into the lungs every 6 (six) hours as needed for wheezing or shortness of breath. (Patient not taking: Reported on 08/02/2018) 1 Inhaler 2  . atorvastatin (LIPITOR) 40 MG tablet Take 1 tablet (40 mg total) by mouth daily. (Patient not taking: Reported on 08/02/2018) 90 tablet 3  . calcitRIOL (ROCALTROL) 0.25 MCG capsule Take 3 capsules (0.75 mcg total) by mouth every Monday, Wednesday, and Friday with hemodialysis. (Patient not taking: Reported on 08/02/2018) 30 capsule 3  . Darbepoetin Alfa (ARANESP) 100 MCG/0.5ML SOSY injection Inject 0.5 mLs (100 mcg total) into the vein every Wednesday with hemodialysis. (Patient not taking: Reported on 08/02/2018) 4.2 mL 3  . multivitamin (RENA-VIT) TABS tablet Take 1 tablet by mouth at bedtime. (Patient not taking: Reported on 08/02/2018) 30 tablet 3  . ondansetron (ZOFRAN) 4 MG tablet Take 1 tablet (4 mg total) by mouth every 8 (eight) hours as needed for nausea or vomiting. (Patient not taking: Reported on 07/14/2018) 20  tablet 0  . warfarin (COUMADIN) 3 MG tablet Take 1 tablet (3 mg total) by mouth one time only at 6 PM. (Patient not taking: Reported on 08/02/2018) 10 tablet 0  . [DISCONTINUED] cinacalcet (SENSIPAR) 30 MG tablet Take 30 mg by mouth daily.     . [DISCONTINUED] gabapentin (NEURONTIN) 300 MG capsule Take 300 mg by mouth at bedtime as needed (sleep).       Assessment: 63 y.o. male with SOB and hyperkalemia due to missed HD.  He is on coumadin PTA for afib. Pharmacy dosing coumadin and heparin. He went to HD early this morning and the heparin infusion was delayed. Heparin level this evening therapeutic at 0.46.  Goal of Therapy:  INR 2-3  Heparin Level 0.3-0.7 Monitor platelets by anticoagulation protocol: Yes   Plan:  -Continue heparin 1200 units/hr -Recheck daily heparin levels  Arrie Senate, PharmD, BCPS Clinical Pharmacist [phone] Please check AMION for all [facility] Pharmacy numbers 08/02/2018

## 2018-08-02 NOTE — Progress Notes (Signed)
Patient seen again this am.  Came off at 5:20 with net UF 4L EDW 71 - post wt 72.4 Lungs Clear. BMP drawn at 7:30 am K down to 5 from more than 7.5 Cr 18 > 9.98.  Expect K to equilibrate more and actually be higher.  Plan repeat BMP at 11 am and if K up significantly run short treatment.  WBC 1.3 this am - possibly due to dialysis effect.    Missed dialysis due to lost of housing. Now living across town from dialysis unit but he says he is able to take the bus to get there.  Amalia Hailey, PA-C [facility]

## 2018-08-02 NOTE — Progress Notes (Signed)
[facility] PROGRESS NOTE  Raymond [name] AST:[id number] DOB: 05/19/1956 DOA: 08/01/2018 PCP: Scot Jun, FNP   Narrative: 63 year old African-American male Known history of chronic renal failure ESRD MWF [facility] Anemia of renal disease Noncompliance with dialysis Hepatitis C's superimposed alcoholism will HTN COPD Paroxysmal A. Fib Prior left [facility] with reverse saphenous vein grafting 04/11/2018  Most recent hospitalization 1/220-1/5 2020 for acute epididymitis with hematuria-at that time was discontinued off of aspirin kept on his Coumadin and sent home on doxycycline to complete the same  Hospitalized again 1/22 with missing 2 treatments due to living situation unstable last treatment was Friday previous hypertensive 180s potassium 8.5 BUN/creatinine 119/18-emergently dialyzed  A & Plan Acute hypoxic respiratory failure likely secondary to missed dialysis Dialyzed times one 1/22 further planning as per nephrology looks like may be dialysis again tomorrow dependent on further planning Acute hyperkalemia -potassium was in the 8 range on admission now is better-defer to nephrology Pancytopenia -repeat labs a.m.-known drinker and cirrhosis-superimposed on anemia renal disease Do not anticipate huge work-up if this is concurrent with labs tomorrow morning-however if his ANC drops below 500 may need to empirically cover him with antibiotics Paroxysmal A. fib chads score >2-3 -does not appear to be on monitors-was discontinued by nephrology because of continuous alarming-I am okay with this for now Hepatitis C/cirrhosis If can get regular medical therapy and willing to abstain from alcohol may be able to get treatment Check viral loads as outpatient Metabolic bone disease-defer to nephrology planning BMI 24   On Coumadin No family present Expect can discharge in 1 to 2 days once planning for dialysis is solidified    Verlon Au, MD  [facility] [address] [place] -[email or web address] 7PM-7AM contact night coverage as above 08/02/2018, 10:37 AM  LOS: 0 days   Consultants:  Nephrology  Procedures:  Dialysis  Antimicrobials:  None  Interval history/Subjective: Awake alert pleasant "I feel cold" Tells me had to move out of his niece's place recently and will need a permanent place to stay but is able to get to dialysis He does not have any chest pain fever or nausea vomiting at this time had some breakfast this morning  Objective:  Vitals:  Vitals:   08/02/18 0900 08/02/18 0957  BP:  (!) 147/92  Pulse: 89   Resp:    Temp:    SpO2: 96%     Exam:  Alert eyes closed pleasant coherent No icterus no pallor slight build Chest clear without added sound Abdomen soft no rebound no guarding Extremity edema Fistula in left arm has good thrill Neurologically intact   I have personally reviewed the following:  DATA   Labs:  WBC 1.3 down from 3.8-platelets 107  Potassium down from 8.5-5.0     Review and summation of old records:  Yes extensively summarized  Scheduled Meds: . amiodarone  200 mg Oral Daily  . amLODipine  2.5 mg Oral Daily  . atorvastatin  40 mg Oral Daily  . [START ON 08/03/2018] calcitRIOL  0.75 mcg Oral Q M,W,F-HD  . cinacalcet  30 mg Oral Daily  . dextrose  50 mL Intravenous Once  . insulin aspart  5 Units Subcutaneous Once  . metoprolol succinate  50 mg Oral BID  . multivitamin  1 tablet Oral QHS  . sevelamer carbonate  4,800 mg Oral TID WC  . warfarin  5 mg Oral Once  . warfarin  5 mg Oral ONCE-1800  . Warfarin - Pharmacist Dosing Inpatient  Does not apply q1800   Continuous Infusions: . heparin      Principal Problem:   Hyperkalemia Active Problems:   HEPATITIS C   HYPERTENSION, BENIGN SYSTEMIC   Anemia associated with stage 5 chronic renal failure (HCC)   COPD (chronic obstructive pulmonary disease) (HCC)   End-stage renal disease on hemodialysis ([place])   Noncompliance of patient with  renal dialysis ([place])   PAF (paroxysmal atrial fibrillation) ([place])   LOS: 0 days

## 2018-08-02 NOTE — Procedures (Signed)
Patient seen and examined on Hemodialysis and the procedure was supervised. BP (!) 187/110   Pulse 85   Temp 97.9 F (36.6 C) (Oral)   Resp 18   Ht 5\' 8"  (1.727 m)   Wt 71 kg   SpO2 96%   BMI 23.80 kg/m    QB 400 mL/ min UF goal 4L 1 hr on 1K bath and the rest of the rx on 2K bath.    Tolerating treatment without complaints at this time.   Madelon Lips MD [facility] pgr [phone] Cell [phone] 2:01 AM

## 2018-08-02 NOTE — ED Notes (Signed)
PAGED DR Hollie Salk TO DR Ruta Hinds

## 2018-08-02 NOTE — Progress Notes (Addendum)
ANTICOAGULATION CONSULT NOTE - Initial Consult  Pharmacy Consult for Coumadin Indication: atrial fibrillation  No Known Allergies  Patient Measurements: Height: 5\' 8"  (172.7 cm) Weight: 156 lb 8.4 oz (71 kg) IBW/kg (Calculated) : 68.4  Vital Signs: Temp: 97.9 F (36.6 C) (01/22 2147) Temp Source: Oral (01/22 2147) BP: 182/117 (01/23 0000) Pulse Rate: 80 (01/23 0000)  Labs: Recent Labs    08/01/18 2230 08/01/18 2238 08/01/18 2350  HGB 8.9* 9.5*  --   HCT 29.8* 28.0*  --   PLT 106*  --   --   LABPROT  --   --  15.2  INR  --   --  1.21  CREATININE 18.96*  --   --     Estimated Creatinine Clearance: 3.8 mL/min (A) (by C-G formula based on SCr of 18.96 mg/dL (H)).   Medical History: Past Medical History:  Diagnosis Date  . Anemia   . CAD (coronary artery disease)    a. 03/2018: cath showing 20 to 30% stenosis along the LAD, luminal irregularities along the RCA, and angiographically normal LCx  . COPD (chronic obstructive pulmonary disease) ([place])   . Dyspnea    "with too much fluid"  . ESRD (end stage renal disease) on dialysis [name])    "MWF; Jeneen Rinks" (07/22/17)  . Hemodialysis patient ([place])   . Hepatitis C    Still positive s/p liver biopsy at [name]  and interferon therapy for 6 months. Most recent lab work was on 10/24/12  . Hepatitis C    "took the tx; gone now" (12/05/2016)  Was treated  . History of blood transfusion ~ 2012/2013   "related to my kidneys; blood was low"  . Hypertension   . Substance abuse ([place])   . Thyroid disease     Medications:  Current Facility-Administered Medications on File Prior to Encounter  Medication Dose Route Frequency Provider Last Rate Last Dose  . midazolam (VERSED) 5 MG/5ML injection    Anesthesia Intra-op Sammie Bench, CRNA   2 mg at 04/11/18 1042   Current Outpatient Medications on File Prior to Encounter  Medication Sig Dispense Refill  . acetaminophen (TYLENOL) 325 MG tablet Take 650 mg by mouth every 6  (six) hours as needed for mild pain.    [name] albuterol (PROVENTIL HFA;VENTOLIN HFA) 108 (90 Base) MCG/ACT inhaler Inhale 2 puffs into the lungs every 6 (six) hours as needed for wheezing or shortness of breath. 1 Inhaler 2  . amiodarone (PACERONE) 200 MG tablet Take 1 tablet (200 mg total) by mouth daily. 90 tablet 3  . amLODipine (NORVASC) 2.5 MG tablet Take 1 tablet (2.5 mg total) by mouth daily. 90 tablet 3  . atorvastatin (LIPITOR) 40 MG tablet Take 1 tablet (40 mg total) by mouth daily. 90 tablet 3  . calcitRIOL (ROCALTROL) 0.25 MCG capsule Take 3 capsules (0.75 mcg total) by mouth every Monday, Wednesday, and Friday with hemodialysis. 30 capsule 3  . cinacalcet (SENSIPAR) 30 MG tablet Take 30 mg by mouth daily.     . Darbepoetin Alfa (ARANESP) 100 MCG/0.5ML SOSY injection Inject 0.5 mLs (100 mcg total) into the vein every Wednesday with hemodialysis. 4.2 mL 3  . gabapentin (NEURONTIN) 300 MG capsule Take 300 mg by mouth at bedtime as needed (sleep).    . metoprolol succinate (TOPROL-XL) 50 MG 24 hr tablet Take 1 tablet (50 mg total) by mouth daily. Take with or immediately following a meal. (Patient taking differently: Take 50 mg by mouth 2 (two) times  daily. Take with or immediately following a meal.) 90 tablet 3  . multivitamin (RENA-VIT) TABS tablet Take 1 tablet by mouth at bedtime. 30 tablet 3  . ondansetron (ZOFRAN) 4 MG tablet Take 1 tablet (4 mg total) by mouth every 8 (eight) hours as needed for nausea or vomiting. (Patient not taking: Reported on 07/14/2018) 20 tablet 0  . sevelamer carbonate (RENVELA) 800 MG tablet Take 4,800 mg by mouth 3 (three) times daily with meals.     . warfarin (COUMADIN) 3 MG tablet Take 1 tablet (3 mg total) by mouth one time only at 6 PM. 10 tablet 0     Assessment: 63 y.o. male admitted with SOB, h/o Afib, to continue Coumadin  Goal of Therapy:  INR 2-3 Monitor platelets by anticoagulation protocol: Yes   Plan:  Coumadin 5 mg now and tonight at 6  pm Daily INR  Raymond [name], Raymond [name] 08/02/2018,12:40 AM   Addendum:  Heparin added with subtherapteutic INR and CHA2DS2/VAS Score of 2. Start heparin 1200 units/hr Check heparin level in 8 hours.  Phillis Knack, PharmD, BCPS 08/02/2018 1:36 AM

## 2018-08-03 LAB — CBC WITH DIFFERENTIAL/PLATELET
Abs Immature Granulocytes: 0 10*3/uL (ref 0.00–0.07)
BASOS ABS: 0 10*3/uL (ref 0.0–0.1)
Basophils Relative: 0 %
Eosinophils Absolute: 0 10*3/uL (ref 0.0–0.5)
Eosinophils Relative: 1 %
HCT: 31.3 % — ABNORMAL LOW (ref 39.0–52.0)
Hemoglobin: 9.6 g/dL — ABNORMAL LOW (ref 13.0–17.0)
Immature Granulocytes: 0 %
Lymphocytes Relative: 45 %
Lymphs Abs: 1 10*3/uL (ref 0.7–4.0)
MCH: 30 pg (ref 26.0–34.0)
MCHC: 30.7 g/dL (ref 30.0–36.0)
MCV: 97.8 fL (ref 80.0–100.0)
Monocytes Absolute: 0.5 10*3/uL (ref 0.1–1.0)
Monocytes Relative: 21 %
Neutro Abs: 0.7 10*3/uL — ABNORMAL LOW (ref 1.7–7.7)
Neutrophils Relative %: 33 %
Platelets: 102 10*3/uL — ABNORMAL LOW (ref 150–400)
RBC: 3.2 MIL/uL — AB (ref 4.22–5.81)
RDW: 15.7 % — ABNORMAL HIGH (ref 11.5–15.5)
WBC: 2.3 10*3/uL — AB (ref 4.0–10.5)
nRBC: 0 % (ref 0.0–0.2)

## 2018-08-03 LAB — HEPATITIS B SURFACE ANTIGEN: HEP B S AG: NEGATIVE

## 2018-08-03 LAB — COMPREHENSIVE METABOLIC PANEL
ALT: 12 U/L (ref 0–44)
ANION GAP: 10 (ref 5–15)
AST: 22 U/L (ref 15–41)
Albumin: 3.3 g/dL — ABNORMAL LOW (ref 3.5–5.0)
Alkaline Phosphatase: 72 U/L (ref 38–126)
BUN: 37 mg/dL — ABNORMAL HIGH (ref 8–23)
CO2: 26 mmol/L (ref 22–32)
Calcium: 7.8 mg/dL — ABNORMAL LOW (ref 8.9–10.3)
Chloride: 101 mmol/L (ref 98–111)
Creatinine, Ser: 7.87 mg/dL — ABNORMAL HIGH (ref 0.61–1.24)
GFR calc Af Amer: 8 mL/min — ABNORMAL LOW (ref >60)
GFR calc non Af Amer: 7 mL/min — ABNORMAL LOW (ref >60)
Glucose, Bld: 93 mg/dL (ref 70–99)
Potassium: 4.2 mmol/L (ref 3.5–5.1)
Sodium: 137 mmol/L (ref 135–145)
Total Bilirubin: 0.5 mg/dL (ref 0.3–1.2)
Total Protein: 7.6 g/dL (ref 6.5–8.1)

## 2018-08-03 LAB — PROTIME-INR
INR: 1.16
Prothrombin Time: 14.7 seconds (ref 11.4–15.2)

## 2018-08-03 LAB — HEPARIN LEVEL (UNFRACTIONATED): HEPARIN UNFRACTIONATED: 0.42 [IU]/mL (ref 0.30–0.70)

## 2018-08-03 LAB — HEPATITIS B SURFACE ANTIBODY,QUALITATIVE: HEP B S AB: REACTIVE

## 2018-08-03 MED ORDER — DARBEPOETIN ALFA 60 MCG/0.3ML IJ SOSY
PREFILLED_SYRINGE | INTRAMUSCULAR | Status: AC
  Administered 2018-08-03: 60 ug via INTRAVENOUS
  Filled 2018-08-03: qty 0.3

## 2018-08-03 MED ORDER — CALCITRIOL 0.5 MCG PO CAPS
ORAL_CAPSULE | ORAL | Status: AC
  Administered 2018-08-03: 1 ug via ORAL
  Filled 2018-08-03: qty 2

## 2018-08-03 MED ORDER — WARFARIN SODIUM 5 MG PO TABS
5.0000 mg | ORAL_TABLET | Freq: Once | ORAL | Status: AC
  Administered 2018-08-03: 5 mg via ORAL
  Filled 2018-08-03 (×2): qty 1

## 2018-08-03 NOTE — Progress Notes (Addendum)
Subjective:  In Hemodialysis room off slightly early sec bp drop / cramping and  only ~~ 700 cc uf 3rd consecutive Hd tx  After missing  Op txs x 2  /normal MWF schedule   Objective Vital signs in last 24 hours: Vitals:   08/03/18 0830 08/03/18 0900 08/03/18 0930 08/03/18 1000  BP: (!) 160/79 (!) 149/87 (!) 156/64 (!) 103/47  Pulse: 63 65 61 67  Resp:      Temp:      TempSrc:      SpO2:      Weight:      Height:       Weight change: -1 kg  Physical Exam: General: alert chronically ill AAM , NAD , holding hd AVF sites  Heart: RRR, 1/6 sem lsb, no rub, gallop Lungs: CTA bilat Abdomen: BS pos , soft , Nt, ND Extremities: no pedal edema  Dialysis Access: LFA AVF + bruit     OP  HD= [facility] MWF 4 hrs EDW 71 kg 2K/ 2.5 Ca F180 dialyzer BFR 450 mL/ min UF profile 4, no sodium modeling Heparin 2000 u bolus Mircera 100 mcg IVP q 2 weeks, to start 08/01/18 but not given Calcitriol 1.5 mcg q rx Parsabiv 10 mg q rx  Problem/Plan:  1. SOB/ hyperkalemia (admit k >8.5 ): due to missed HD, housing/ transport issues. Resolved.  2.  ESRD: MWF [facility].  HD today  3 . Hypertension: on Admit hypertensive to 180s/ 110s./  improved with UF.  no edw or BP med changes  4.  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 Pancytopenia = WBC 2.3 / plt 102,pre hd //yest. Wbc 1.3 Plt 107  < wbc  3.8 Plts 106  ( 1/23) with ho known drinker and cirrhosis-superimposed/ Hep C. Admit team eval.  5.  Metabolic Bone Disease: calcitriol 1.5 mcg q rx, parsabiv 10 mg q rx (not available here), binder is renvela 5 TID AC 6.  Afib: on amiodarone and warfarin  [name], PA-C [address] [phone] 08/03/2018,11:33 AM  LOS: 1 day   Pt seen, examined and agree w A/P as above. Needs housing and HD transportation, if these are secured he can be dc'd.   Kelly Splinter MD [employer] pager [phone]   08/03/2018, 12:18 PM  Addendum= NEEDS SW input as he  currently is Homeless / He tells me= "Was trying to get housing / assistance  With Program helping People in his situation with Alcohol problem."  He has no cell phone .  Labs: Basic Metabolic Panel: Recent Labs  Lab 08/02/18 0731 08/02/18 1109 08/03/18 0337  NA 136 135 137  K 5.0 5.3* 4.2  CL 95* 94* 101  CO2 23 25 26   GLUCOSE 83 132* 93  BUN 41* 45* 37*  CREATININE 9.98* 10.57* 7.87*  CALCIUM 8.7* 8.3* 7.8*  PHOS 5.5*  --   --    Liver Function Tests: Recent Labs  Lab 08/02/18 0731 08/03/18 0337  AST  --  22  ALT  --  12  ALKPHOS  --  72  BILITOT  --  0.5  PROT  --  7.6  ALBUMIN 3.9 3.3*  CBC: Recent Labs  Lab 08/01/18 2230 08/01/18 2238 08/02/18 0731 08/03/18 0337  WBC 3.8*  --  1.3* 2.3*  NEUTROABS 2.2  --   --  0.7*  HGB 8.9* 9.5* 10.3* 9.6*  HCT 29.8* 28.0* 32.8* 31.3*  MCV 100.7*  --  96.5 97.8  PLT 106*  --  107* 102*      Medications: . sodium chloride    . sodium chloride    . heparin 1,200 Units/hr (08/03/18 0914)   . amiodarone  200 mg Oral Daily  . amLODipine  2.5 mg Oral Daily  . atorvastatin  40 mg Oral Daily  . calcitRIOL  1 mcg Oral Q M,W,F-HD  . Chlorhexidine Gluconate Cloth  6 each Topical Q0600  . cinacalcet  30 mg Oral Daily  . darbepoetin (ARANESP) injection - DIALYSIS  60 mcg Intravenous Q Fri-HD  . dextrose  50 mL Intravenous Once  . insulin aspart  5 Units Subcutaneous Once  . metoprolol succinate  50 mg Oral BID  . multivitamin  1 tablet Oral QHS  . sevelamer carbonate  4,800 mg Oral TID WC  . warfarin  5 mg Oral ONCE-1800  . Warfarin - Pharmacist Dosing Inpatient   Does not apply [phone]

## 2018-08-03 NOTE — Progress Notes (Signed)
[place] for Coumadin, heparin  Indication: atrial fibrillation  No Known Allergies  Patient Measurements: Height: 5\' 8"  (172.7 cm) Weight: 152 lb 1.9 oz (69 kg) IBW/kg (Calculated) : 68.4  Vital Signs: Temp: 98.8 F (37.1 C) (01/24 0432) Temp Source: Oral (01/24 0432) BP: 118/82 (01/24 0432) Pulse Rate: 79 (01/24 0432)  Labs: Recent Labs    08/01/18 2230 08/01/18 2238 08/01/18 2350  08/02/18 0731 08/02/18 1109 08/02/18 1945 08/03/18 0337  HGB 8.9* 9.5*  --   --  10.3*  --   --  9.6*  HCT 29.8* 28.0*  --   --  32.8*  --   --  31.3*  PLT 106*  --   --   --  107*  --   --  102*  LABPROT  --   --  15.2  --   --   --   --  14.7  INR  --   --  1.21  --   --   --   --  1.16  HEPARINUNFRC  --   --   --   --  <0.10*  --  0.46 0.42  CREATININE 18.96*  --   --    < > 9.98* 10.57*  --  7.87*   < > = values in this interval not displayed.    Estimated Creatinine Clearance: 9.2 mL/min (A) (by C-G formula based on SCr of 7.87 mg/dL (H)).   Medical History: Past Medical History:  Diagnosis Date  . Anemia   . CAD (coronary artery disease)    a. 03/2018: cath showing 20 to 30% stenosis along the LAD, luminal irregularities along the RCA, and angiographically normal LCx  . COPD (chronic obstructive pulmonary disease) ([place])   . Dyspnea    "with too much fluid"  . ESRD (end stage renal disease) on dialysis [facility])    "MWF; [facility]" (07/22/17)  . Hemodialysis patient ([place])   . Hepatitis C    Still positive s/p liver biopsy at [facility]  and interferon therapy for 6 months. Most recent lab work was on 10/24/12  . Hepatitis C    "took the tx; gone now" (12/05/2016)  Was treated  . History of blood transfusion ~ 2012/2013   "related to my kidneys; blood was low"  . Hypertension   . Substance abuse ([place])   . Thyroid disease     Medications:  Current Facility-Administered Medications on File Prior to Encounter  Medication Dose Route Frequency  Provider Last Rate Last Dose  . midazolam (VERSED) 5 MG/5ML injection    Anesthesia Intra-op Sammie Bench, CRNA   2 mg at 04/11/18 1042   Current Outpatient Medications on File Prior to Encounter  Medication Sig Dispense Refill  . acetaminophen (TYLENOL) 325 MG tablet Take 650 mg by mouth every 6 (six) hours as needed for mild pain.    [name] amiodarone (PACERONE) 200 MG tablet Take 1 tablet (200 mg total) by mouth daily. 90 tablet 3  . amLODipine (NORVASC) 2.5 MG tablet Take 1 tablet (2.5 mg total) by mouth daily. 90 tablet 3  . metoprolol succinate (TOPROL-XL) 50 MG 24 hr tablet Take 1 tablet (50 mg total) by mouth daily. Take with or immediately following a meal. (Patient taking differently: Take 50 mg by mouth 2 (two) times daily. Take with or immediately following a meal.) 90 tablet 3  . sevelamer carbonate (RENVELA) 800 MG tablet Take 4,800 mg by mouth 3 (three) times daily  with meals.     [name] albuterol (PROVENTIL HFA;VENTOLIN HFA) 108 (90 Base) MCG/ACT inhaler Inhale 2 puffs into the lungs every 6 (six) hours as needed for wheezing or shortness of breath. (Patient not taking: Reported on 08/02/2018) 1 Inhaler 2  . atorvastatin (LIPITOR) 40 MG tablet Take 1 tablet (40 mg total) by mouth daily. (Patient not taking: Reported on 08/02/2018) 90 tablet 3  . calcitRIOL (ROCALTROL) 0.25 MCG capsule Take 3 capsules (0.75 mcg total) by mouth every Monday, Wednesday, and Friday with hemodialysis. (Patient not taking: Reported on 08/02/2018) 30 capsule 3  . Darbepoetin Alfa (ARANESP) 100 MCG/0.5ML SOSY injection Inject 0.5 mLs (100 mcg total) into the vein every Wednesday with hemodialysis. (Patient not taking: Reported on 08/02/2018) 4.2 mL 3  . multivitamin (RENA-VIT) TABS tablet Take 1 tablet by mouth at bedtime. (Patient not taking: Reported on 08/02/2018) 30 tablet 3  . ondansetron (ZOFRAN) 4 MG tablet Take 1 tablet (4 mg total) by mouth every 8 (eight) hours as needed for nausea or vomiting. (Patient not  taking: Reported on 07/14/2018) 20 tablet 0  . warfarin (COUMADIN) 3 MG tablet Take 1 tablet (3 mg total) by mouth one time only at 6 PM. (Patient not taking: Reported on 08/02/2018) 10 tablet 0     Assessment: 63 y.o. male with SOB and hyperkalemia due to missed HD.  He is on coumadin PTA for afib. Pharmacy dosing coumadin and heparin. He was prescribed warfarin 3 mg daily. Of note, he has not filled this prescription since he last left the hospital. During medication history interview, he reported that he has transportation issues to pick up medications from the pharmacy.    Heparin level this morning is therapeutic at 0.42. INR remains subtherapeutic at 1.16. Warfarin 5 mg given yesterday.   Goal of Therapy:  INR 2-3  Heparin Level 0.3-0.7 Monitor platelets by anticoagulation protocol: Yes   Plan:  -Continue heparin 1200 units/hr -Warfarin 5 mg again today -Check daily heparin levels, INR, CBC -Monitor for signs/symptoms of bleeding  Vertis Kelch, PharmD PGY1 Pharmacy Resident Phone [phone] 08/03/2018       8:07 AM

## 2018-08-03 NOTE — Progress Notes (Addendum)
PROGRESS NOTE  Raymond [name] GQQ:[id number] DOB: 29-Nov-1955 DOA: 08/01/2018 PCP: Scot Jun, FNP  HPI/Recap of past 35 hours: 63 year old African-American male male known history of chronic renal failure on hemodialysis Monday Wednesday and Friday, with history of anemia of renal disease noncompliant with diabetes stomach dialysis hepatitis C hypertension COPD paroxysmal A. fib who was admitted for acute hypoxic respiratory failure secondary to missed dialysis.  Subjective: Patient seen and examined at bedside he denies any complaints.  Patient received hemodialysis today  Assessment/Plan: Principal Problem:   Hyperkalemia Active Problems:   HEPATITIS C   HYPERTENSION, BENIGN SYSTEMIC   Anemia associated with stage 5 chronic renal failure (HCC)   COPD (chronic obstructive pulmonary disease) (HCC)   End-stage renal disease on hemodialysis ([place])   Noncompliance of patient with renal dialysis ([place])   PAF (paroxysmal atrial fibrillation) (HCC)   1.  hyperkalemia resolved patient has received hemodialysis  2.  End-stage renal disease on hemodialysis patient received hemodialysis this hospital stay.  3.  Hypertension fairly controlled continue home medicine  4.  COPD we will continue home medications  5.  Anemia of end-stage renal disease being managed by nephrology.  6.  Paroxysmal atrial fibrillation continue Coumadin and amiodarone  Code Status: Full  Severity of Illness: The appropriate patient status for this patient is INPATIENT. Inpatient status is judged to be reasonable and necessary in order to provide the required intensity of service to ensure the patient's safety. The patient's presenting symptoms, physical exam findings, and initial radiographic and laboratory data in the context of their chronic comorbidities is felt to place them at high risk for further clinical deterioration. Furthermore, it is not anticipated that the patient will be medically stable for  discharge from the hospital within 2 midnights of admission. The following factors support the patient status of inpatient.   " The patient's presenting symptoms include shortness of breath fluid overload. " The worrisome physical exam findings include shortness of breath full overload. " The initial radiographic and laboratory data are worrisome because of abnormal chemistry. " The chronic co-morbidities include stage renal on dialysis.   * I certify that at the point of admission it is my clinical judgment that the patient will require inpatient hospital care spanning beyond 2 midnights from the point of admission due to high intensity of service, high risk for further deterioration and high frequency of surveillance required.*    Family Communication: None  Disposition Plan: To be determined   Consultants:  Nephrology  Procedures:  Dialysis  Antimicrobials:  None  DVT prophylaxis: Coumadin   Objective: Vitals:   08/03/18 0900 08/03/18 0930 08/03/18 1000 08/03/18 1210  BP: (!) 149/87 (!) 156/64 (!) 103/47 127/85  Pulse: 65 61 67 67  Resp:      Temp:    98.5 F (36.9 C)  TempSrc:    Oral  SpO2:    97%  Weight:      Height:        Intake/Output Summary (Last 24 hours) at 08/03/2018 1454 Last data filed at 08/02/2018 2125 Gross per 24 hour  Intake 480 ml  Output 1000 ml  Net -520 ml   Filed Weights   08/02/18 1838 08/02/18 2144 08/03/18 0647  Weight: 70 kg 69 kg 70 kg   Body mass index is 23.46 kg/m.  Exam:  . General: 63 y.o. year-old male well developed well nourished in no acute distress.  Alert and oriented x3. . Cardiovascular: Regular rate and rhythm with  no rubs or gallops.  No thyromegaly or JVD noted.   [name] Respiratory: Clear to auscultation with no wheezes or rales. Good inspiratory effort. . Abdomen: Soft nontender nondistended with normal bowel sounds x4 quadrants. . Musculoskeletal: No lower extremity edema. 2/4 pulses in all 4  extremities. . Skin: No ulcerative lesions noted or rashes, . Psychiatry: Mood is appropriate for condition and setting    Data Reviewed: CBC: Recent Labs  Lab 08/01/18 2230 08/01/18 2238 08/02/18 0731 08/03/18 0337  WBC 3.8*  --  1.3* 2.3*  NEUTROABS 2.2  --   --  0.7*  HGB 8.9* 9.5* 10.3* 9.6*  HCT 29.8* 28.0* 32.8* 31.3*  MCV 100.7*  --  96.5 97.8  PLT 106*  --  107* 062*   Basic Metabolic Panel: Recent Labs  Lab 08/01/18 2230 08/01/18 2238 08/02/18 0252 08/02/18 0731 08/02/18 1109 08/03/18 0337  NA 139 137 139 136 135 137  K >7.5* >8.5* 5.1 5.0 5.3* 4.2  CL 105  --  102 95* 94* 101  CO2 18*  --  21* 23 25 26   GLUCOSE 70  --  83 83 132* 93  BUN 119*  --  71* 41* 45* 37*  CREATININE 18.96*  --  11.98* 9.98* 10.57* 7.87*  CALCIUM 7.7*  --  8.5* 8.7* 8.3* 7.8*  PHOS  --   --   --  5.5*  --   --    GFR: Estimated Creatinine Clearance: 9.2 mL/min (A) (by C-G formula based on SCr of 7.87 mg/dL (H)). Liver Function Tests: Recent Labs  Lab 08/02/18 0731 08/03/18 0337  AST  --  22  ALT  --  12  ALKPHOS  --  72  BILITOT  --  0.5  PROT  --  7.6  ALBUMIN 3.9 3.3*   No results for input(s): LIPASE, AMYLASE in the last 168 hours. No results for input(s): AMMONIA in the last 168 hours. Coagulation Profile: Recent Labs  Lab 08/01/18 2350 08/03/18 0337  INR 1.21 1.16   Cardiac Enzymes: No results for input(s): CKTOTAL, CKMB, CKMBINDEX, TROPONINI in the last 168 hours. BNP (last 3 results) No results for input(s): PROBNP in the last 8760 hours. HbA1C: No results for input(s): HGBA1C in the last 72 hours. CBG: No results for input(s): GLUCAP in the last 168 hours. Lipid Profile: No results for input(s): CHOL, HDL, LDLCALC, TRIG, CHOLHDL, LDLDIRECT in the last 72 hours. Thyroid Function Tests: No results for input(s): TSH, T4TOTAL, FREET4, T3FREE, THYROIDAB in the last 72 hours. Anemia Panel: No results for input(s): VITAMINB12, FOLATE, FERRITIN, TIBC, IRON,  RETICCTPCT in the last 72 hours. Urine analysis:    Component Value Date/Time   COLORURINE YELLOW 05/08/2010 [place] 05/08/2010 1335   LABSPEC 1.009 05/08/2010 1335   PHURINE 6.0 05/08/2010 1335   GLUCOSEU NEGATIVE 05/08/2010 1335   HGBUR NEGATIVE 05/08/2010 1335   BILIRUBINUR NEGATIVE 05/08/2010 1335   KETONESUR NEGATIVE 05/08/2010 1335   PROTEINUR 100 (A) 05/08/2010 1335   UROBILINOGEN 0.2 05/08/2010 1335   NITRITE NEGATIVE 05/08/2010 1335   LEUKOCYTESUR SMALL (A) 05/08/2010 1335   Sepsis Labs: @LABRCNTIP (procalcitonin:4,lacticidven:4)  )No results found for this or any previous visit (from the past 240 hour(s)).    Studies: No results found.  Scheduled Meds: . amiodarone  200 mg Oral Daily  . amLODipine  2.5 mg Oral Daily  . atorvastatin  40 mg Oral Daily  . calcitRIOL  1 mcg Oral Q M,W,F-HD  . Chlorhexidine Gluconate Cloth  6 each Topical Q0600  . cinacalcet  30 mg Oral Daily  . darbepoetin (ARANESP) injection - DIALYSIS  60 mcg Intravenous Q Fri-HD  . dextrose  50 mL Intravenous Once  . insulin aspart  5 Units Subcutaneous Once  . metoprolol succinate  50 mg Oral BID  . multivitamin  1 tablet Oral QHS  . sevelamer carbonate  4,800 mg Oral TID WC  . warfarin  5 mg Oral ONCE-1800  . Warfarin - Pharmacist Dosing Inpatient   Does not apply q1800    Continuous Infusions: . heparin 1,200 Units/hr (08/03/18 0914)     LOS: 1 day     Cristal Deer, MD [facility]  To reach me or the doctor on call, go to: [email or web address] Password [facility]  08/03/2018, 2:54 PM

## 2018-08-03 NOTE — Plan of Care (Signed)

## 2018-08-04 LAB — PROTIME-INR
INR: 1.18
Prothrombin Time: 14.9 seconds (ref 11.4–15.2)

## 2018-08-04 LAB — CBC
HCT: 34.3 % — ABNORMAL LOW (ref 39.0–52.0)
HEMOGLOBIN: 10.9 g/dL — AB (ref 13.0–17.0)
MCH: 31.1 pg (ref 26.0–34.0)
MCHC: 31.8 g/dL (ref 30.0–36.0)
MCV: 98 fL (ref 80.0–100.0)
Platelets: 96 10*3/uL — ABNORMAL LOW (ref 150–400)
RBC: 3.5 MIL/uL — ABNORMAL LOW (ref 4.22–5.81)
RDW: 15.7 % — ABNORMAL HIGH (ref 11.5–15.5)
WBC: 3 10*3/uL — ABNORMAL LOW (ref 4.0–10.5)
nRBC: 0 % (ref 0.0–0.2)

## 2018-08-04 LAB — HEPARIN LEVEL (UNFRACTIONATED): Heparin Unfractionated: 0.54 IU/mL (ref 0.30–0.70)

## 2018-08-04 MED ORDER — CINACALCET HCL 30 MG PO TABS: 30.0000 mg | ORAL_TABLET | Freq: Every day | ORAL | 0 refills | Status: DC

## 2018-08-04 MED ORDER — WARFARIN SODIUM 5 MG PO TABS: 5.0000 mg | ORAL_TABLET | Freq: Once | ORAL | Status: DC

## 2018-08-04 MED ORDER — WARFARIN SODIUM 5 MG PO TABS: 5.0000 mg | ORAL_TABLET | Freq: Once | ORAL | 0 refills | Status: DC

## 2018-08-04 NOTE — Discharge Summary (Signed)
Discharge Summary  Raymond [name] RAQ:[id number] DOB: May 11, 1956  PCP: Scot Jun, FNP  Admit date: 08/01/2018 Discharge date: 08/04/2018  Time spent: 40 minutes  Recommendations for Outpatient Follow-up:  1. Outpatient hemodialysis, follow-up with primary care provider,  Discharge Diagnoses:  Active Hospital Problems   Diagnosis Date Noted  . Hyperkalemia 12/19/2015  . PAF (paroxysmal atrial fibrillation) ([place]) 08/01/2018  . Noncompliance of patient with renal dialysis ([place]) 07/13/2018  . End-stage renal disease on hemodialysis ([place])   . COPD (chronic obstructive pulmonary disease) ([place])   . 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 HYPERTENSION, BENIGN SYSTEMIC 09/07/2006  . HEPATITIS C 09/07/2006    Resolved Hospital Problems  No resolved problems to display.    Discharge Condition: Improved  Diet recommendation: Renal  Vitals:   08/04/18 1045 08/04/18 1331  BP: 125/78 124/66  Pulse: 72   Resp:    Temp:  98 F (36.7 C)  SpO2:      History of present illness:   63 year old African-American male male known history of chronic renal failure on hemodialysis Monday Wednesday and Friday, with history of anemia of renal disease noncompliant with diabetes stomach dialysis hepatitis C hypertension COPD paroxysmal A. fib who was admitted for acute hypoxic respiratory failure secondary to missed dialysis.  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  He received hemodialysis with improvement in  his symptoms there was a question about him being homeless.  Social worker was consulted.  Patient stated he does not have any problem getting to his hemodialysis units.  Patient was encouraged to not miss hemodialysis.  Patient stated that he is working with someone at a behavioral health agency to help him with housing  Procedures:  Hemodialysis  Consultations:  Nephrology  Social worker  Discharge Exam: BP 124/66 (BP Location: Right Leg)   Pulse 72   Temp 98 F (36.7 C) (Oral)   Resp 17   Ht 5\' 8"  (1.727 m)   Wt 70 kg   SpO2 100%   BMI 23.46 kg/m   General: Alert oriented x3 well-nourished Cardiovascular: Heart rate regular rate and irregular rhythm rhythm Respiratory: Clear to auscultation work of breathing is normal  Discharge Instructions You were cared for by a hospitalist during your hospital stay. If you have any questions about your discharge medications or the care you received while you were in the hospital after you are discharged, you can call the unit and asked to speak with the hospitalist on call if the hospitalist that took care of you is not available. Once you are discharged, your primary care physician will handle any further medical issues. Please note that NO REFILLS for any discharge medications will be authorized once you are discharged, as it is imperative that you return to your primary care physician (or establish a relationship with a primary care physician if you do not have one) for your aftercare needs so that they can reassess your need for medications and monitor your lab values.  Discharge Instructions    Call MD for:  difficulty breathing, headache or visual disturbances   Complete by:  As directed    Diet - low sodium heart healthy   Complete by:  As directed    Discharge instructions   Complete by:  As directed    Patient would continue his hemodialysis Monday Wednesday and Friday.  Also to fill his medications and take them regularly    Increase activity slowly   Complete by:  As directed      Allergies as of 08/04/2018   No Known Allergies     Medication List    TAKE these medications   acetaminophen 325 MG tablet Commonly known as:  TYLENOL Take 650 mg by mouth every 6 (six) hours as needed for mild pain.   albuterol 108 (90 Base) MCG/ACT inhaler Commonly known as:  PROVENTIL HFA;VENTOLIN HFA Inhale 2 puffs into the lungs every 6 (six) hours as needed for wheezing or shortness of breath.   amiodarone 200 MG tablet Commonly known as:  PACERONE Take 1 tablet (200 mg total) by mouth daily.   amLODipine 2.5 MG tablet Commonly known as:  NORVASC Take 1 tablet (2.5 mg total) by mouth daily.   atorvastatin 40 MG tablet Commonly known as:  LIPITOR Take 1 tablet (40 mg total) by mouth daily.   calcitRIOL 0.25 MCG capsule Commonly known as:  ROCALTROL Take 3 capsules (0.75 mcg total) by mouth every Monday, Wednesday, and Friday with hemodialysis.   cinacalcet 30 MG tablet Commonly known as:  SENSIPAR Take 1 tablet (30 mg total) by mouth daily. Start taking on:  August 05, 2018   Darbepoetin Alfa 100 MCG/0.5ML Sosy injection Commonly known as:  ARANESP Inject 0.5 mLs (100 mcg total) into the vein every Wednesday with hemodialysis.   metoprolol succinate 50 MG 24 hr tablet Commonly known as:  TOPROL-XL Take 1 tablet (50 mg total) by mouth daily. Take with or immediately following a meal. What changed:  when to take this   multivitamin Tabs tablet Take 1 tablet by mouth at bedtime.   ondansetron 4 MG tablet Commonly known as:  ZOFRAN Take 1 tablet (4 mg total) by mouth every 8 (eight) hours as needed for nausea or vomiting.   RENVELA 800 MG tablet Generic drug:  sevelamer carbonate Take 4,800 mg by mouth 3 (three) times daily with meals.   warfarin 5 MG tablet Commonly known as:  COUMADIN Take 1 tablet (5 mg total) by mouth one time only at 6 PM. What changed:    medication strength  how much  to take      No Known Allergies    The results of significant diagnostics from this hospitalization (including imaging, microbiology, ancillary and laboratory) are listed below for reference.    Significant Diagnostic Studies: Dg Chest Port 1 View  Result Date: 08/01/2018 CLINICAL DATA:  Shortness of breath EXAM: PORTABLE CHEST 1 VIEW COMPARISON:  07/12/2018, 04/15/2018 FINDINGS: Cardiomegaly with aortic atherosclerosis. No consolidation or effusion. No pneumothorax. IMPRESSION: No active disease.  Cardiomegaly Electronically Signed   By: Donavan Foil M.D.   On: 08/01/2018 22:09   Dg Chest Port 1 View  Result Date: 07/12/2018 CLINICAL DATA:  Shortness of breath EXAM: PORTABLE CHEST 1 VIEW COMPARISON:  04/15/2018 FINDINGS: Cardiomegaly.  No consolidation or effusion.  No pneumothorax. IMPRESSION: No active disease.  Cardiomegaly. Electronically Signed   By: Donavan Foil M.D.   On: 07/12/2018 23:51   US Scrotum W/doppler  Result Date: 07/14/2018 CLINICAL DATA:  63 year old male with left-sided scrotal  pain for the past 4 days EXAM: SCROTAL ULTRASOUND DOPPLER ULTRASOUND OF THE TESTICLES TECHNIQUE: Complete ultrasound examination of the testicles, epididymis, and other scrotal structures was performed. Color and spectral Doppler ultrasound were also utilized to evaluate blood flow to the testicles. COMPARISON:  None. FINDINGS: Right testicle Measurements: 4.4 x 2.3 x 2.2 cm. No mass or microlithiasis visualized. Left testicle Measurements: 3.5 x 2.1 x 2.6 cm. No mass or microlithiasis visualized. Scrotal skin thickening present on the left. Right epididymis: Normal in size. No hypervascularity. Circumscribed anechoic simple cyst in the epididymal head measuring 0.6 x 1.0 x 0.5 cm. Left epididymis:  Enlarged and hypervascular left epididymis. Hydrocele:  None visualized. Varicocele:  Positive for a left-sided varicocele. Pulsed Doppler interrogation of both testes demonstrates normal low resistance  arterial and venous waveforms bilaterally. IMPRESSION: 1. Acute left epididymitis. 2. Predominantly left-sided scrotal skin thickening, likely reactive. 3. Small left varicocele. 4. Epididymal cyst versus spermatocele in the right epididymal head noted incidentally. Electronically Signed   By: Jacqulynn Cadet M.D.   On: 07/14/2018 12:34    Microbiology: No results found for this or any previous visit (from the past 240 hour(s)).   Labs: Basic Metabolic Panel: Recent Labs  Lab 08/01/18 2230 08/01/18 2238 08/02/18 0252 08/02/18 0731 08/02/18 1109 08/03/18 0337  NA 139 137 139 136 135 137  K >7.5* >8.5* 5.1 5.0 5.3* 4.2  CL 105  --  102 95* 94* 101  CO2 18*  --  21* 23 25 26   GLUCOSE 70  --  83 83 132* 93  BUN 119*  --  71* 41* 45* 37*  CREATININE 18.96*  --  11.98* 9.98* 10.57* 7.87*  CALCIUM 7.7*  --  8.5* 8.7* 8.3* 7.8*  PHOS  --   --   --  5.5*  --   --    Liver Function Tests: Recent Labs  Lab 08/02/18 0731 08/03/18 0337  AST  --  22  ALT  --  12  ALKPHOS  --  72  BILITOT  --  0.5  PROT  --  7.6  ALBUMIN 3.9 3.3*   No results for input(s): LIPASE, AMYLASE in the last 168 hours. No results for input(s): AMMONIA in the last 168 hours. CBC: Recent Labs  Lab 08/01/18 2230 08/01/18 2238 08/02/18 0731 08/03/18 0337 08/04/18 0253  WBC 3.8*  --  1.3* 2.3* 3.0*  NEUTROABS 2.2  --   --  0.7*  --   HGB 8.9* 9.5* 10.3* 9.6* 10.9*  HCT 29.8* 28.0* 32.8* 31.3* 34.3*  MCV 100.7*  --  96.5 97.8 98.0  PLT 106*  --  107* 102* 96*   Cardiac Enzymes: No results for input(s): CKTOTAL, CKMB, CKMBINDEX, TROPONINI in the last 168 hours. BNP: BNP (last 3 results) No results for input(s): BNP in the last 8760 hours.  ProBNP (last 3 results) No results for input(s): PROBNP in the last 8760 hours.  CBG: No results for input(s): GLUCAP in the last 168 hours.     Signed:  Cristal Deer, MD [facility] 08/04/2018, 1:45 PM

## 2018-08-04 NOTE — Clinical Social Work Note (Addendum)
Clinical Social Work Assessment  Patient Details  Name: Raymond [name] MRN: [id number] Date of Birth: November 03, 1955  Date of referral:  08/04/18               Reason for consult:  Housing Concerns/Homelessness                Permission sought to share information with:  Family Supports Permission granted to share information::  Yes, Verbal Permission Granted  Name::     Raymond [name]   Agency::     Relationship::  sister  Contact Information:  [phone]  Housing/Transportation Living arrangements for the past 2 months:  Single Family Home Source of Information:  Patient Patient Interpreter Needed:  None Criminal Activity/Legal Involvement Pertinent to Current Situation/Hospitalization:  No - Comment as needed Significant Relationships:  Other Family Members Lives with:  Other (Comment)(was staying with niece but no longer wants to go there) Do you feel safe going back to the place where you live?  Yes Need for family participation in patient care:  Yes (Comment)  Care giving concerns:  CSW received consult for homelessness. CSW visit the patient at bedside, he was alert and oriented. Patient states was living with his niece. He was paying rent there but was sleeping on the floor and do not want to return there. Patient states he is working the a agency in the community to assist with housing but he was unsure of the agency's name. However, he plan on going there Monday to follow up on his housing status. Patient states he receives SSI/SSA = $794.00 per month.  Patient states he came to the hospital with shoes and shirt. Emergency department reported to NT they did not have any belongings of the patient. CSW provided the short and long sleeve shirt and requested the nurse provided the patient with two pairs of socks. Patient states he was sure he could return to his niece home and he agreed he would have to leave in socks. CSW provided the patient with cab voucher.    Social Worker  assessment / plan:  CSW provided resources for the patient and called the [employer] [facility] the local shelter and was unable to reach anyone by phone. CSW explained shelters do not hold beds and is based on first come basis. Patient was provided with shelters listing. Patient states he will go stay with his niece, Raymond [name], until at least Monday. Patient explained his sister, Raymond Laming, do not drive and his brother was at work therefore, there was no one CSW could call to get him shoes. Patient states he will be fine since he will be leaving by cab.  Patient expressed to CSW and MD he has no barriers getting to dialysis. He  declined needing a bus passes, stating he has a bus card.   Employment status:  Disabled (Comment on whether or not currently receiving Disability) Insurance information:  Medicare PT Recommendations:  No Follow Up Information / Referral to community resources:     Patient/Family's Response to care: Patient states no concerns. He appreciated CSW helping him.   Patient/Family's Understanding of and Emotional Response to Diagnosis, Current Treatment, and Prognosis:  Patient expressed understanding of CSW role and discharge process as well as medical condition. No questions/concerns about plan or treatment at this time.   Emotional Assessment Appearance:  Appears stated age Attitude/Demeanor/Rapport:  Engaged Affect (typically observed):  Appropriate, Calm, Hopeful Orientation:  Oriented to Self, Oriented to Place, Oriented to  Time, Oriented to Situation  Alcohol / Substance use:  Not Applicable Psych involvement (Current and /or in the community):  No (Comment)  Discharge Needs  Concerns to be addressed:  Homelessness Readmission within the last 30 days:  No Current discharge risk:  None Barriers to Discharge:  Continued Medical Work up   [employer], [place] 08/04/2018, 2:26 PM

## 2018-08-04 NOTE — Progress Notes (Signed)
[place] for Coumadin, heparin  Indication: atrial fibrillation  No Known Allergies  Patient Measurements: Height: 5\' 8"  (172.7 cm) Weight: 154 lb 5.2 oz (70 kg) IBW/kg (Calculated) : 68.4  Vital Signs: Temp: 98 F (36.7 C) (01/25 0300) Temp Source: Oral (01/25 0300) BP: 115/70 (01/25 0300) Pulse Rate: 85 (01/25 0300)  Labs: Recent Labs    08/01/18 2350  08/02/18 0731 08/02/18 1109 08/02/18 1945 08/03/18 0337 08/04/18 0253  HGB  --   --  10.3*  --   --  9.6* 10.9*  HCT  --   --  32.8*  --   --  31.3* 34.3*  PLT  --   --  107*  --   --  102* 96*  LABPROT 15.2  --   --   --   --  14.7 14.9  INR 1.21  --   --   --   --  1.16 1.18  HEPARINUNFRC  --    < > <0.10*  --  0.46 0.42 0.54  CREATININE  --    < > 9.98* 10.57*  --  7.87*  --    < > = values in this interval not displayed.    Estimated Creatinine Clearance: 9.2 mL/min (A) (by C-G formula based on SCr of 7.87 mg/dL (H)).   Medical History: Past Medical History:  Diagnosis Date  . Anemia   . CAD (coronary artery disease)    a. 03/2018: cath showing 20 to 30% stenosis along the LAD, luminal irregularities along the RCA, and angiographically normal LCx  . COPD (chronic obstructive pulmonary disease) ([place])   . Dyspnea    "with too much fluid"  . ESRD (end stage renal disease) on dialysis [facility])    "MWF; [facility] Rinks" (07/22/17)  . Hemodialysis patient ([place])   . Hepatitis C    Still positive s/p liver biopsy at [facility]  and interferon therapy for 6 months. Most recent lab work was on 10/24/12  . Hepatitis C    "took the tx; gone now" (12/05/2016)  Was treated  . History of blood transfusion ~ 2012/2013   "related to my kidneys; blood was low"  . Hypertension   . Substance abuse ([place])   . Thyroid disease     Medications:  Current Facility-Administered Medications on File Prior to Encounter  Medication Dose Route Frequency Provider Last Rate Last Dose  . midazolam (VERSED) 5  MG/5ML injection    Anesthesia Intra-op Sammie Bench, CRNA   2 mg at 04/11/18 1042   Current Outpatient Medications on File Prior to Encounter  Medication Sig Dispense Refill  . acetaminophen (TYLENOL) 325 MG tablet Take 650 mg by mouth every 6 (six) hours as needed for mild pain.    [name] amiodarone (PACERONE) 200 MG tablet Take 1 tablet (200 mg total) by mouth daily. 90 tablet 3  . amLODipine (NORVASC) 2.5 MG tablet Take 1 tablet (2.5 mg total) by mouth daily. 90 tablet 3  . metoprolol succinate (TOPROL-XL) 50 MG 24 hr tablet Take 1 tablet (50 mg total) by mouth daily. Take with or immediately following a meal. (Patient taking differently: Take 50 mg by mouth 2 (two) times daily. Take with or immediately following a meal.) 90 tablet 3  . sevelamer carbonate (RENVELA) 800 MG tablet Take 4,800 mg by mouth 3 (three) times daily with meals.     [name] albuterol (PROVENTIL HFA;VENTOLIN HFA) 108 (90 Base) MCG/ACT inhaler Inhale 2 puffs into the lungs  every 6 (six) hours as needed for wheezing or shortness of breath. (Patient not taking: Reported on 08/02/2018) 1 Inhaler 2  . atorvastatin (LIPITOR) 40 MG tablet Take 1 tablet (40 mg total) by mouth daily. (Patient not taking: Reported on 08/02/2018) 90 tablet 3  . calcitRIOL (ROCALTROL) 0.25 MCG capsule Take 3 capsules (0.75 mcg total) by mouth every Monday, Wednesday, and Friday with hemodialysis. (Patient not taking: Reported on 08/02/2018) 30 capsule 3  . Darbepoetin Alfa (ARANESP) 100 MCG/0.5ML SOSY injection Inject 0.5 mLs (100 mcg total) into the vein every Wednesday with hemodialysis. (Patient not taking: Reported on 08/02/2018) 4.2 mL 3  . multivitamin (RENA-VIT) TABS tablet Take 1 tablet by mouth at bedtime. (Patient not taking: Reported on 08/02/2018) 30 tablet 3  . ondansetron (ZOFRAN) 4 MG tablet Take 1 tablet (4 mg total) by mouth every 8 (eight) hours as needed for nausea or vomiting. (Patient not taking: Reported on 07/14/2018) 20 tablet 0  .  warfarin (COUMADIN) 3 MG tablet Take 1 tablet (3 mg total) by mouth one time only at 6 PM. (Patient not taking: Reported on 08/02/2018) 10 tablet 0     Assessment: 63 y.o. male with SOB and hyperkalemia due to missed HD.  He is on coumadin PTA for afib. Pharmacy dosing coumadin and heparin. He was prescribed warfarin 3 mg daily. Of note, he has not filled this prescription since he last left the hospital. During medication history interview, he reported that he has transportation issues to pick up medications from the pharmacy.    Heparin level this morning is therapeutic at 0.54. INR remains subtherapeutic at 1.18. Warfarin 5 mg given yesterday. CBC stable.  Goal of Therapy:  INR 2-3  Heparin Level 0.3-0.7 Monitor platelets by anticoagulation protocol: Yes   Plan:  -Continue heparin 1200 units/hr -Warfarin 5 mg again today -Check daily heparin levels, INR, CBC -Monitor for signs/symptoms of bleeding  Alanda Slim, PharmD, [facility] Clinical Pharmacist Please see AMION for all Pharmacists' Contact Phone Numbers 08/04/2018, 10:07 AM

## 2018-08-04 NOTE — Progress Notes (Signed)
Paged CSW for 4E at 0815 about patient needs for transportation to outpatient dialysis and challenges faced with homelessness status.  Awaiting to hear back from [place].

## 2018-08-04 NOTE — Progress Notes (Signed)
Subjective:  Seen in room, no new c/o.    Objective Vital signs in last 24 hours: Vitals:   08/03/18 1840 08/03/18 1933 08/04/18 0300 08/04/18 1045  BP: 105/83 109/82 115/70 125/78  Pulse: 66 67 85 72  Resp:  18 17   Temp:  98.5 F (36.9 C) 98 F (36.7 C)   TempSrc:  Oral Oral   SpO2: 100% 100% 100%   Weight:      Height:       Weight change:   Physical Exam: General: alert chronically ill AAM , NAD , holding hd AVF sites  Heart: RRR, 1/6 sem lsb, no rub, gallop Lungs: CTA bilat Abdomen: BS pos , soft , Nt, ND Extremities: no pedal edema  Dialysis Access: LFA AVF + bruit     OP  HD= [facility] MWF 4 hrs EDW 71 kg 2K/ 2.5 Ca F180 dialyzer BFR 450 mL/ min UF profile 4, no sodium modeling Heparin 2000 u bolus Mircera 100 mcg IVP q 2 weeks, to start 08/01/18 but not given Calcitriol 1.5 mcg q rx Parsabiv 10 mg q rx  Problem/Plan: 1. SOB/ hyperkalemia (admit k >8.5 ): resolved, due to missed HD, housing/ transport issues. Resolved.  2.  Poor social support: needs SW input as he currently is Homeless / He tells me= "Was trying to get housing / assistance  With Program helping People in his situation with Alcohol problem."  He has no cell phone . SW consulted twice, no response yet. Can't be dc'd w/o reliable method of transportation to get to HD, which usually requires also to have reliable housing. If can't get to HD will need SNF placement.  3.  ESRD: MWF [facility].  HD Monday 4. Hypertension: stable 5. Anemia of ESRD: 9.6 today Had missed OP ESA dosing, will provide 6. Pancytopenia = WBC 2.3 / plt 102,pre hd //yest. Wbc 1.3 Plt 107  < wbc  3.8 Plts 106  ( 1/23) with ho known drinker and cirrhosis-superimposed/ Hep C. Admit team eval.  7.  Metabolic Bone Disease: calcitriol 1.5 mcg q rx, parsabiv 10 mg q rx (not available here), binder is renvela 5 TID AC 8.  Afib: on amiodarone and warfarin     Kelly Splinter MD [facility] pager [phone]   08/04/2018, 12:36  PM    Labs: Basic Metabolic Panel: Recent Labs  Lab 08/02/18 0731 08/02/18 1109 08/03/18 0337  NA 136 135 137  K 5.0 5.3* 4.2  CL 95* 94* 101  CO2 23 25 26   GLUCOSE 83 132* 93  BUN 41* 45* 37*  CREATININE 9.98* 10.57* 7.87*  CALCIUM 8.7* 8.3* 7.8*  PHOS 5.5*  --   --    Liver Function Tests: Recent Labs  Lab 08/02/18 0731 08/03/18 0337  AST  --  22  ALT  --  12  ALKPHOS  --  72  BILITOT  --  0.5  PROT  --  7.6  ALBUMIN 3.9 3.3*  CBC: Recent Labs  Lab 08/01/18 2230  08/02/18 0731 08/03/18 0337 08/04/18 0253  WBC 3.8*  --  1.3* 2.3* 3.0*  NEUTROABS 2.2  --   --  0.7*  --   HGB 8.9*   < > 10.3* 9.6* 10.9*  HCT 29.8*   < > 32.8* 31.3* 34.3*  MCV 100.7*  --  96.5 97.8 98.0  PLT 106*  --  107* 102* 96*   < > = values in this interval not displayed.  Medications: . heparin 1,200 Units/hr (08/04/18 0254)   . amiodarone  200 mg Oral Daily  . amLODipine  2.5 mg Oral Daily  . atorvastatin  40 mg Oral Daily  . calcitRIOL  1 mcg Oral Q M,W,F-HD  . Chlorhexidine Gluconate Cloth  6 each Topical Q0600  . cinacalcet  30 mg Oral Daily  . darbepoetin (ARANESP) injection - DIALYSIS  60 mcg Intravenous Q Fri-HD  . dextrose  50 mL Intravenous Once  . insulin aspart  5 Units Subcutaneous Once  . metoprolol succinate  50 mg Oral BID  . multivitamin  1 tablet Oral QHS  . sevelamer carbonate  4,800 mg Oral TID WC  . warfarin  5 mg Oral ONCE-1800  . Warfarin - Pharmacist Dosing Inpatient   Does not apply [phone]

## 2018-08-06 DIAGNOSIS — N2581 Secondary hyperparathyroidism of renal origin: Secondary | ICD-10-CM | POA: Diagnosis not present

## 2018-08-06 DIAGNOSIS — D509 Iron deficiency anemia, unspecified: Secondary | ICD-10-CM | POA: Diagnosis not present

## 2018-08-06 DIAGNOSIS — N186 End stage renal disease: Secondary | ICD-10-CM | POA: Diagnosis not present

## 2018-08-08 DIAGNOSIS — N2581 Secondary hyperparathyroidism of renal origin: Secondary | ICD-10-CM | POA: Diagnosis not present

## 2018-08-08 DIAGNOSIS — D509 Iron deficiency anemia, unspecified: Secondary | ICD-10-CM | POA: Diagnosis not present

## 2018-08-08 DIAGNOSIS — N186 End stage renal disease: Secondary | ICD-10-CM | POA: Diagnosis not present

## 2018-08-09 ENCOUNTER — Emergency Department (HOSPITAL_COMMUNITY): Payer: Medicare Other

## 2018-08-09 ENCOUNTER — Inpatient Hospital Stay (HOSPITAL_COMMUNITY)
Admission: EM | Admit: 2018-08-09 | Discharge: 2018-08-13 | DRG: 308 | Disposition: A | Discharge status: Home / Self Care | Payer: Medicare Other | Attending: Internal Medicine | Admitting: Internal Medicine

## 2018-08-09 ENCOUNTER — Encounter (HOSPITAL_COMMUNITY): Payer: Self-pay | Admitting: Emergency Medicine

## 2018-08-09 ENCOUNTER — Other Ambulatory Visit: Payer: Self-pay

## 2018-08-09 DIAGNOSIS — R079 Chest pain, unspecified: Secondary | ICD-10-CM | POA: Diagnosis present

## 2018-08-09 DIAGNOSIS — I712 Thoracic aortic aneurysm, without rupture: Secondary | ICD-10-CM | POA: Diagnosis not present

## 2018-08-09 DIAGNOSIS — N186 End stage renal disease: Secondary | ICD-10-CM

## 2018-08-09 DIAGNOSIS — R1110 Vomiting, unspecified: Secondary | ICD-10-CM | POA: Diagnosis present

## 2018-08-09 DIAGNOSIS — R05 Cough: Secondary | ICD-10-CM | POA: Diagnosis not present

## 2018-08-09 DIAGNOSIS — Z590 Homelessness: Secondary | ICD-10-CM

## 2018-08-09 DIAGNOSIS — I120 Hypertensive chronic kidney disease with stage 5 chronic kidney disease or end stage renal disease: Secondary | ICD-10-CM | POA: Diagnosis present

## 2018-08-09 DIAGNOSIS — N2581 Secondary hyperparathyroidism of renal origin: Secondary | ICD-10-CM | POA: Diagnosis not present

## 2018-08-09 DIAGNOSIS — R509 Fever, unspecified: Secondary | ICD-10-CM | POA: Diagnosis present

## 2018-08-09 DIAGNOSIS — R197 Diarrhea, unspecified: Secondary | ICD-10-CM | POA: Diagnosis present

## 2018-08-09 DIAGNOSIS — Z7901 Long term (current) use of anticoagulants: Secondary | ICD-10-CM

## 2018-08-09 DIAGNOSIS — D649 Anemia, unspecified: Secondary | ICD-10-CM | POA: Diagnosis not present

## 2018-08-09 DIAGNOSIS — Z8249 Family history of ischemic heart disease and other diseases of the circulatory system: Secondary | ICD-10-CM

## 2018-08-09 DIAGNOSIS — R0689 Other abnormalities of breathing: Secondary | ICD-10-CM | POA: Diagnosis not present

## 2018-08-09 DIAGNOSIS — I4892 Unspecified atrial flutter: Secondary | ICD-10-CM | POA: Diagnosis not present

## 2018-08-09 DIAGNOSIS — Z992 Dependence on renal dialysis: Secondary | ICD-10-CM

## 2018-08-09 DIAGNOSIS — I2510 Atherosclerotic heart disease of native coronary artery without angina pectoris: Secondary | ICD-10-CM | POA: Diagnosis present

## 2018-08-09 DIAGNOSIS — E039 Hypothyroidism, unspecified: Secondary | ICD-10-CM | POA: Diagnosis present

## 2018-08-09 DIAGNOSIS — I10 Essential (primary) hypertension: Secondary | ICD-10-CM | POA: Diagnosis present

## 2018-08-09 DIAGNOSIS — I480 Paroxysmal atrial fibrillation: Secondary | ICD-10-CM | POA: Diagnosis not present

## 2018-08-09 DIAGNOSIS — Z9115 Patient's noncompliance with renal dialysis: Secondary | ICD-10-CM

## 2018-08-09 DIAGNOSIS — Z79899 Other long term (current) drug therapy: Secondary | ICD-10-CM

## 2018-08-09 DIAGNOSIS — Z833 Family history of diabetes mellitus: Secondary | ICD-10-CM

## 2018-08-09 DIAGNOSIS — E8889 Other specified metabolic disorders: Secondary | ICD-10-CM | POA: Diagnosis present

## 2018-08-09 DIAGNOSIS — J449 Chronic obstructive pulmonary disease, unspecified: Secondary | ICD-10-CM | POA: Diagnosis present

## 2018-08-09 DIAGNOSIS — Z8619 Personal history of other infectious and parasitic diseases: Secondary | ICD-10-CM

## 2018-08-09 DIAGNOSIS — I440 Atrioventricular block, first degree: Secondary | ICD-10-CM | POA: Diagnosis present

## 2018-08-09 DIAGNOSIS — R531 Weakness: Secondary | ICD-10-CM | POA: Diagnosis not present

## 2018-08-09 DIAGNOSIS — R000 Tachycardia, unspecified: Secondary | ICD-10-CM | POA: Diagnosis not present

## 2018-08-09 DIAGNOSIS — I959 Hypotension, unspecified: Secondary | ICD-10-CM | POA: Diagnosis present

## 2018-08-09 DIAGNOSIS — J80 Acute respiratory distress syndrome: Secondary | ICD-10-CM | POA: Diagnosis not present

## 2018-08-09 DIAGNOSIS — R0789 Other chest pain: Secondary | ICD-10-CM | POA: Diagnosis not present

## 2018-08-09 DIAGNOSIS — F17210 Nicotine dependence, cigarettes, uncomplicated: Secondary | ICD-10-CM | POA: Diagnosis present

## 2018-08-09 LAB — COMPREHENSIVE METABOLIC PANEL
ALT: 18 U/L (ref 0–44)
AST: 31 U/L (ref 15–41)
Albumin: 3 g/dL — ABNORMAL LOW (ref 3.5–5.0)
Alkaline Phosphatase: 56 U/L (ref 38–126)
Anion gap: 16 — ABNORMAL HIGH (ref 5–15)
BUN: 44 mg/dL — ABNORMAL HIGH (ref 8–23)
CO2: 17 mmol/L — ABNORMAL LOW (ref 22–32)
Calcium: 6.7 mg/dL — ABNORMAL LOW (ref 8.9–10.3)
Chloride: 105 mmol/L (ref 98–111)
Creatinine, Ser: 9.16 mg/dL — ABNORMAL HIGH (ref 0.61–1.24)
GFR calc non Af Amer: 5 mL/min — ABNORMAL LOW (ref >60)
GFR, EST AFRICAN AMERICAN: 6 mL/min — AB (ref >60)
Glucose, Bld: 86 mg/dL (ref 70–99)
Potassium: 3.7 mmol/L (ref 3.5–5.1)
Sodium: 138 mmol/L (ref 135–145)
Total Bilirubin: 0.8 mg/dL (ref 0.3–1.2)
Total Protein: 6.4 g/dL — ABNORMAL LOW (ref 6.5–8.1)

## 2018-08-09 LAB — CBC
HCT: 32.9 % — ABNORMAL LOW (ref 39.0–52.0)
Hemoglobin: 9.9 g/dL — ABNORMAL LOW (ref 13.0–17.0)
MCH: 31.5 pg (ref 26.0–34.0)
MCHC: 30.1 g/dL (ref 30.0–36.0)
MCV: 104.8 fL — ABNORMAL HIGH (ref 80.0–100.0)
PLATELETS: 120 10*3/uL — AB (ref 150–400)
RBC: 3.14 MIL/uL — ABNORMAL LOW (ref 4.22–5.81)
RDW: 16.2 % — AB (ref 11.5–15.5)
WBC: 3.7 10*3/uL — ABNORMAL LOW (ref 4.0–10.5)
nRBC: 0.8 % — ABNORMAL HIGH (ref 0.0–0.2)

## 2018-08-09 LAB — I-STAT TROPONIN, ED: Troponin i, poc: 0.03 ng/mL (ref 0.00–0.08)

## 2018-08-09 IMAGING — DX DG CHEST 2V
2 series · 2 of 2 positions shown · non-contrast
Comparison: [DATE], [DATE], [DATE]

CLINICAL DATA: Weakness and cough.  Fever.

EXAM:
CHEST - 2 VIEW

[chest pa]
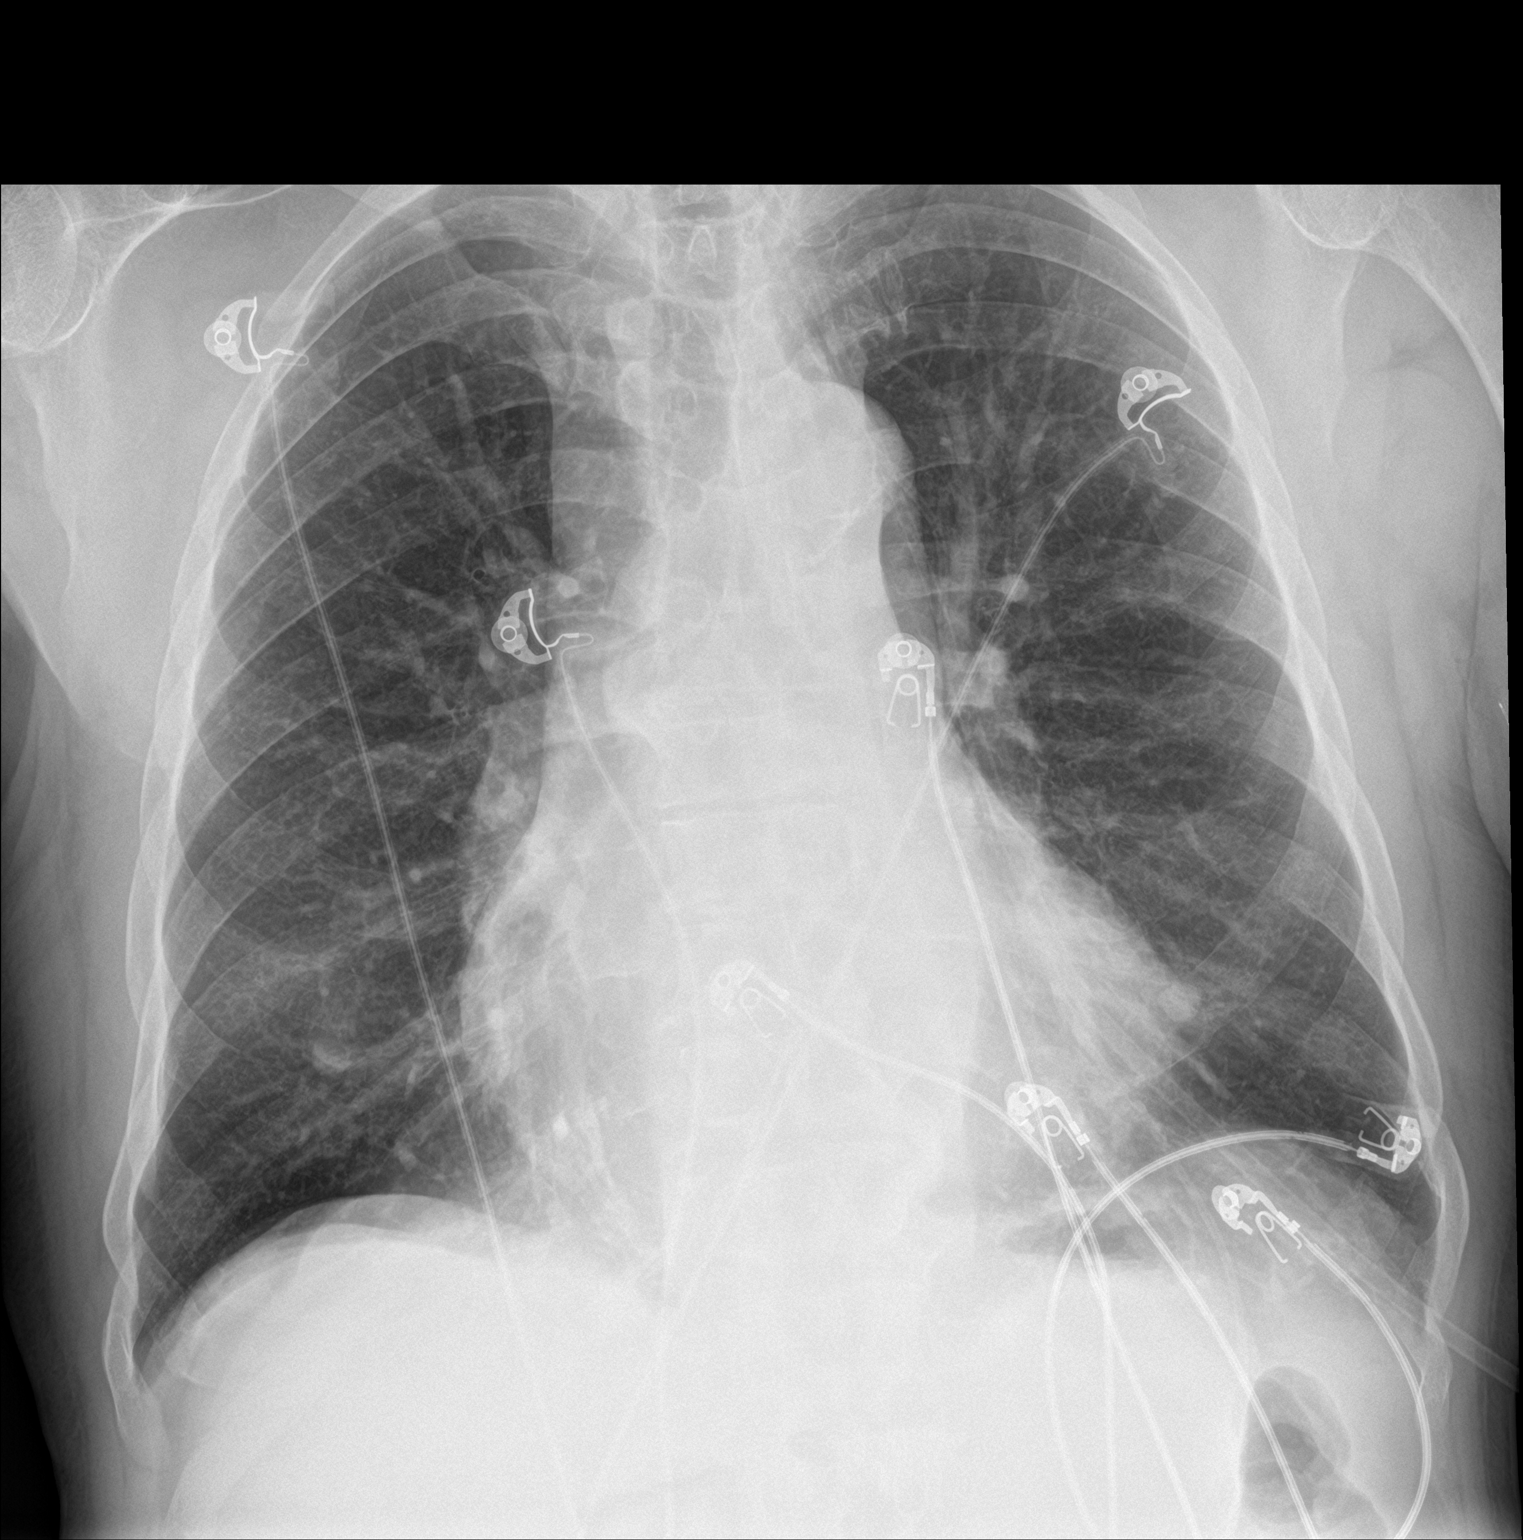

[chest lat]
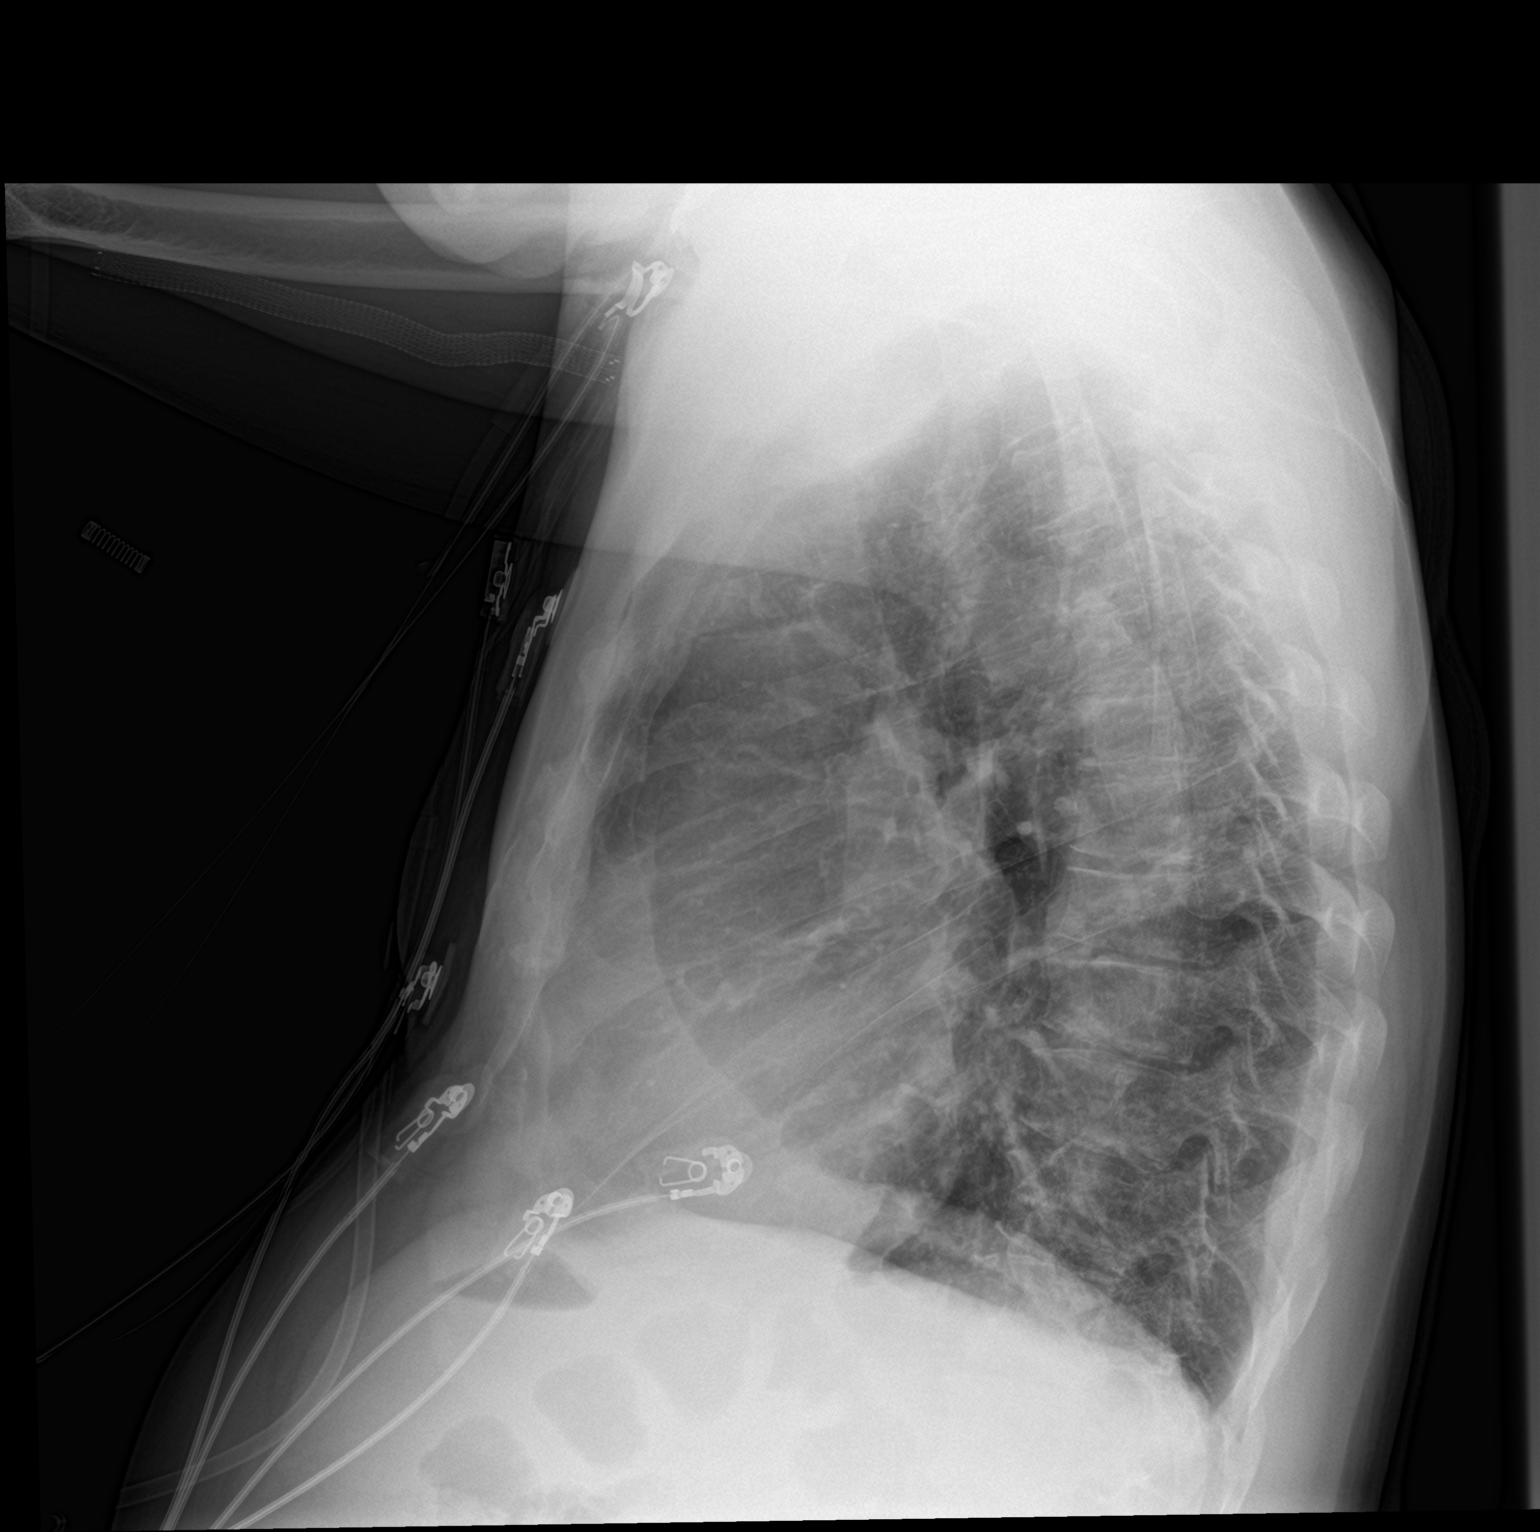

[2 of 2 positions shown; findings below may reference images not displayed]

FINDINGS: The lungs are well inflated. There is mild cardiomegaly.

There is no focal airspace consolidation or pulmonary edema. There
is no pleural effusion or pneumothorax.
IMPRESSION: Mild cardiomegaly without acute airspace disease.

## 2018-08-09 MED ORDER — DILTIAZEM HCL 25 MG/5ML IV SOLN
10.0000 mg | Freq: Once | INTRAVENOUS | Status: AC
  Administered 2018-08-09: 10 mg via INTRAVENOUS
  Filled 2018-08-09: qty 5

## 2018-08-09 NOTE — ED Notes (Signed)
Pt to xray at this time.

## 2018-08-09 NOTE — ED Provider Notes (Addendum)
[facility] EMERGENCY DEPARTMENT Provider Note   CSN: [id number] Arrival date & time: 08/09/18  2132     History   Chief Complaint Chief Complaint  Patient presents with  . Chest Pain    HPI Raymond [name] is a 63 y.o. male.  HPI Patient presented to the emergency room for evaluation of chest pain and weakness.  Patient states the last day or so he has had some trouble with some nausea vomiting and diarrhea.  He has had some chest pain that feels like a sharp stinging pain on the left side of his chest.  He is not sure if he has had any fevers.  He has had some diffuse body aches.  Today the patient felt weak and was experiencing the pain in his chest so he called EMS.  Patient was noted to be tachycardic on arrival.  She does have history of chronic renal failure.  He was recently in the hospital for missing dialysis.  Patient thinks he might of taken off too much fluid the last time he was at dialysis but he has gone to dialysis both Monday and Wednesday of this week. Past Medical History:  Diagnosis Date  . Anemia   . CAD (coronary artery disease)    a. 03/2018: cath showing 20 to 30% stenosis along the LAD, luminal irregularities along the RCA, and angiographically normal LCx  . COPD (chronic obstructive pulmonary disease) ([place])   . Dyspnea    "with too much fluid"  . ESRD (end stage renal disease) on dialysis [facility])    "MWF; Jeneen Rinks" (07/22/17)  . Hemodialysis patient ([place])   . Hepatitis C    Still positive s/p liver biopsy at [facility]  and interferon therapy for 6 months. Most recent lab work was on 10/24/12  . Hepatitis C    "took the tx; gone now" (12/05/2016)  Was treated  . History of blood transfusion ~ 2012/2013   "related to my kidneys; blood was low"  . Hypertension   . Substance abuse ([place])   . Thyroid disease     Patient Active Problem List   Diagnosis Date Noted  . PAF (paroxysmal atrial fibrillation) ([place]) 08/01/2018  . Acute  respiratory failure with hypoxia ([place]) 07/13/2018  . Noncompliance of patient with renal dialysis ([place]) 07/13/2018  . PAD (peripheral artery disease) ([place]) 04/11/2018  . Sepsis ([place]) 03/31/2018  . Preop cardiovascular exam 03/29/2018  . Volume overload 08/07/2017  . Acute pulmonary edema (H[facility])   . Dyspnea 06/20/2017  . Acute bronchitis   . COPD (chronic obstructive pulmonary disease) ([place])   . End-stage renal disease on hemodialysis ([place])   . Hypoxia 12/05/2016  . Hyperkalemia 12/19/2015  . Hypoglycemia 12/19/2015  . Polysubstance abuse ([place]) 12/19/2015  . Homelessness 12/19/2015  . Anemia associated with stage 5 chronic renal failure ([place]) 12/19/2015  . Atypical chest pain 12/19/2013  . Atherosclerosis of native arteries of extremity with intermittent claudication ([place]) 12/04/2012  . ESRD (end stage renal disease) ([place]) 12/04/2012  . HEPATITIS C 09/07/2006  . HYPERCHOLESTEROLEMIA 09/07/2006  . HYPERTENSION, BENIGN SYSTEMIC 09/07/2006    Past Surgical History:  Procedure Laterality Date  . ABDOMINAL AORTOGRAM W/LOWER EXTREMITY N/A 02/08/2018   Procedure: ABDOMINAL AORTOGRAM W/LOWER EXTREMITY;  Surgeon: Conrad Wanda, MD;  Location: [place] CV LAB;  Service: Cardiovascular;  Laterality: N/A;  . AV FISTULA PLACEMENT Left Aug. 2013 ?  . FEMORAL-POPLITEAL BYPASS GRAFT Left 04/11/2018   Procedure: BYPASS GRAFT FEMORAL-POPLITEAL ARTERY LEFT  LEG;  Surgeon: Rosetta Posner, MD;  Location: [place];  Service: Vascular;  Laterality: Left;  . LEFT HEART CATH AND CORONARY ANGIOGRAPHY N/A 03/29/2018   Procedure: LEFT HEART CATH AND CORONARY ANGIOGRAPHY;  Surgeon: Nelva Bush, MD;  Location: [place] CV LAB;  Service: Cardiovascular;  Laterality: N/A;  . LIVER BIOPSY    . ORIF ULNAR FRACTURE Left 05/18/2017   Procedure: OPEN REDUCTION INTERNAL FIXATION (ORIF) ULNAR FRACTURE;  Surgeon: Iran Planas, MD;  Location: [place];  Service: Orthopedics;  Laterality: Left;  . SHUNTOGRAM N/A 12/11/2012    Procedure: Earney Mallet;  Surgeon: Serafina Mitchell, MD;  Location: [facility] CATH LAB;  Service: Cardiovascular;  Laterality: N/A;        Home Medications    Prior to Admission medications   Medication Sig Start Date End Date Taking? Authorizing Provider  acetaminophen (TYLENOL) 325 MG tablet Take 650 mg by mouth every 6 (six) hours as needed for mild pain.    [provider]  albuterol (PROVENTIL HFA;VENTOLIN HFA) 108 (90 Base) MCG/ACT inhaler Inhale 2 puffs into the lungs every 6 (six) hours as needed for wheezing or shortness of breath. Patient not taking: Reported on 08/02/2018 12/06/16   Lorella Nimrod, MD  amiodarone (PACERONE) 200 MG tablet Take 1 tablet (200 mg total) by mouth daily. 05/15/18   Isaiah Serge, NP  amLODipine (NORVASC) 2.5 MG tablet Take 1 tablet (2.5 mg total) by mouth daily. 05/15/18 08/13/18  Isaiah Serge, NP  atorvastatin (LIPITOR) 40 MG tablet Take 1 tablet (40 mg total) by mouth daily. Patient not taking: Reported on 08/02/2018 03/27/18 08/01/18  Isaiah Serge, NP  calcitRIOL (ROCALTROL) 0.25 MCG capsule Take 3 capsules (0.75 mcg total) by mouth every Monday, Wednesday, and Friday with hemodialysis. Patient not taking: Reported on 08/02/2018 04/23/18   Waynetta Sandy, MD  cinacalcet (SENSIPAR) 30 MG tablet Take 1 tablet (30 mg total) by mouth daily. 08/05/18   Cristal Deer, MD  Darbepoetin Alfa (ARANESP) 100 MCG/0.5ML SOSY injection Inject 0.5 mLs (100 mcg total) into the vein every Wednesday with hemodialysis. Patient not taking: Reported on 08/02/2018 04/25/18   Waynetta Sandy, MD  metoprolol succinate (TOPROL-XL) 50 MG 24 hr tablet Take 1 tablet (50 mg total) by mouth daily. Take with or immediately following a meal. Patient taking differently: Take 50 mg by mouth 2 (two) times daily. Take with or immediately following a meal. 05/15/18 08/13/18  Isaiah Serge, NP  multivitamin (RENA-VIT) TABS tablet Take 1 tablet by mouth at bedtime. Patient  not taking: Reported on 08/02/2018 04/21/18   Waynetta Sandy, MD  ondansetron (ZOFRAN) 4 MG tablet Take 1 tablet (4 mg total) by mouth every 8 (eight) hours as needed for nausea or vomiting. Patient not taking: Reported on 07/14/2018 04/03/18   Thurnell Lose, MD  sevelamer carbonate (RENVELA) 800 MG tablet Take 4,800 mg by mouth 3 (three) times daily with meals.     [provider]  warfarin (COUMADIN) 5 MG tablet Take 1 tablet (5 mg total) by mouth one time only at 6 PM. 08/04/18   Cristal Deer, MD    Family History Family History  Problem Relation Age of Onset  . Diabetes Father   . Hypertension Father   . Heart disease Father     Social History Social History   Tobacco Use  . Smoking status: Current Every Day Smoker    Packs/day: 1.00    Years: 25.00    Pack years:  25.00    Types: Cigarettes    Last attempt to quit: 08/01/2011    Years since quitting: 7.0  . Smokeless tobacco: Never Used  Substance Use Topics  . Alcohol use: Yes    Comment: occasionally  . Drug use: Yes    Types: Cocaine    Comment: clean x approx 2 months (07/22/17)     Allergies   Patient has no known allergies.   Review of Systems Review of Systems  All other systems reviewed and are negative.    Physical Exam Updated Vital Signs BP (!) 106/94   Pulse 68   Temp 97.7 F (36.5 C) (Oral)   Resp 15   Ht 1.727 m (5\' 8" )   Wt 70 kg   SpO2 96%   BMI 23.46 kg/m   Physical Exam Vitals signs and nursing note reviewed.  Constitutional:      General: He is not in acute distress.    Appearance: He is well-developed.  HENT:     Head: Normocephalic and atraumatic.     Right Ear: External ear normal.     Left Ear: External ear normal.  Eyes:     General: No scleral icterus.       Right eye: No discharge.        Left eye: No discharge.     Conjunctiva/sclera: Conjunctivae normal.  Neck:     Musculoskeletal: Neck supple.     Trachea: No tracheal deviation.    Cardiovascular:     Rate and Rhythm: Normal rate and regular rhythm.  Pulmonary:     Effort: Pulmonary effort is normal. No respiratory distress.     Breath sounds: Normal breath sounds. No stridor. No wheezing or rales.  Abdominal:     General: Bowel sounds are normal. There is no distension.     Palpations: Abdomen is soft.     Tenderness: There is no abdominal tenderness. There is no guarding or rebound.  Musculoskeletal:        General: No tenderness.  Skin:    General: Skin is warm and dry.     Findings: No rash.  Neurological:     Mental Status: He is alert.     Cranial Nerves: No cranial nerve deficit (no facial droop, extraocular movements intact, no slurred speech).     Sensory: No sensory deficit.     Motor: No abnormal muscle tone or seizure activity.     Coordination: Coordination normal.      ED Treatments / Results  Labs (all labs ordered are listed, but only abnormal results are displayed) Labs Reviewed  CBC - Abnormal; Notable for the following components:      Result Value   WBC 3.7 (*)    RBC 3.14 (*)    Hemoglobin 9.9 (*)    HCT 32.9 (*)    MCV 104.8 (*)    RDW 16.2 (*)    Platelets 120 (*)    nRBC 0.8 (*)    All other components within normal limits  COMPREHENSIVE METABOLIC PANEL - Abnormal; Notable for the following components:   CO2 17 (*)    BUN 44 (*)    Creatinine, Ser 9.16 (*)    Calcium 6.7 (*)    Total Protein 6.4 (*)    Albumin 3.0 (*)    GFR calc non Af Amer 5 (*)    GFR calc Af Amer 6 (*)    Anion gap 16 (*)    All other components within normal  limits  I-STAT TROPONIN, ED    EKG EKG Interpretation  Date/Time:  Thursday August 09 2018 21:33:32 EST Ventricular Rate:  131 PR Interval:    QRS Duration: 91 QT Interval:  352 QTC Calculation: 520 R Axis:   58 Text Interpretation:  Atrial flutter with predominant 2:1 AV block LVH with secondary repolarization abnormality ST depression, consider ischemia, diffuse lds Minimal ST  elevation, lateral leads Prolonged QT interval atrial flutter is new since last tracing Confirmed by Dorie Rank [phone]) on 08/09/2018 9:44:04 PM   Radiology Dg Chest 2 View  Result Date: 08/09/2018 CLINICAL DATA:  Weakness and cough.  Fever. EXAM: CHEST - 2 VIEW COMPARISON:  08/01/2018, 07/12/2018, 04/15/2018 FINDINGS: The lungs are well inflated. There is mild cardiomegaly. There is no focal airspace consolidation or pulmonary edema. There is no pleural effusion or pneumothorax. IMPRESSION: Mild cardiomegaly without acute airspace disease. Electronically Signed   By: Ulyses Jarred M.D.   On: 08/09/2018 22:42    Procedures .Critical Care Performed by: Dorie Rank, MD Authorized by: Dorie Rank, MD   Critical care provider statement:    Critical care time (minutes):  35   Critical care was time spent personally by me on the following activities:  Discussions with consultants, evaluation of patient's response to treatment, examination of patient, ordering and performing treatments and interventions, ordering and review of laboratory studies, ordering and review of radiographic studies, pulse oximetry, re-evaluation of patient's condition, obtaining history from patient or surrogate and review of old charts   (including critical care time)  Medications Ordered in ED Medications  diltiazem (CARDIZEM) injection 10 mg (10 mg Intravenous Given 08/09/18 2245)     Initial Impression / Assessment and Plan / ED Course  I have reviewed the triage vital signs and the nursing notes.  Pertinent labs & imaging results that were available during my care of the patient were reviewed by me and considered in my medical decision making (see chart for details).   Patient presented to the emergency room for evaluation of chest pain.  Patient does have a history of nonobstructive coronary artery disease.  Patient here in the ED was also noted to have tachycardia with a rate of 120.  Patient was given a dose of  Cardizem and his heart rate improved.  Initial troponin is normal.  Patient symptoms are atypical ACS.  Plan on monitoring in the ED and doing a delta troponin.  Case turned over to Dr. Nicholes Stairs.  Final Clinical Impressions(s) / ED Diagnoses   Final diagnoses:  Chest pain, unspecified type  Paroxysmal atrial fibrillation [facility])    ED Discharge Orders    None       Dorie Rank, MD 08/09/18 2310 NOtified that pt is hypotensive.  Will give fluid bolus.  Will ct scan to evaluate for pe, vascular etiology   Dorie Rank, MD 08/10/18 0002 Cc addendum   Dorie Rank, MD 08/18/18 1017

## 2018-08-09 NOTE — ED Triage Notes (Signed)
[facility] a [facility] for [facility] chest pain, worsening with deep breathing, hypotension, and tachycardia. Pt reports having dialysis yesterday but did not have a full treatment. EMS reports that initially th pain was intermittent but upon arrival pain became constant. BP 70s/40 P130s O2 100% RA 18g RAC 300L mL NS given PTA

## 2018-08-10 ENCOUNTER — Emergency Department (HOSPITAL_COMMUNITY): Payer: Medicare Other

## 2018-08-10 DIAGNOSIS — R1110 Vomiting, unspecified: Secondary | ICD-10-CM | POA: Diagnosis present

## 2018-08-10 DIAGNOSIS — R079 Chest pain, unspecified: Secondary | ICD-10-CM | POA: Diagnosis present

## 2018-08-10 DIAGNOSIS — I712 Thoracic aortic aneurysm, without rupture: Secondary | ICD-10-CM | POA: Diagnosis not present

## 2018-08-10 DIAGNOSIS — R197 Diarrhea, unspecified: Secondary | ICD-10-CM

## 2018-08-10 DIAGNOSIS — I4892 Unspecified atrial flutter: Secondary | ICD-10-CM | POA: Diagnosis present

## 2018-08-10 LAB — I-STAT TROPONIN, ED: Troponin i, poc: 0.04 ng/mL (ref 0.00–0.08)

## 2018-08-10 LAB — LIPASE, BLOOD: Lipase: 104 U/L — ABNORMAL HIGH (ref 11–51)

## 2018-08-10 LAB — RENAL FUNCTION PANEL
Albumin: 2.9 g/dL — ABNORMAL LOW (ref 3.5–5.0)
Anion gap: 16 — ABNORMAL HIGH (ref 5–15)
BUN: 49 mg/dL — ABNORMAL HIGH (ref 8–23)
CO2: 21 mmol/L — ABNORMAL LOW (ref 22–32)
CREATININE: 10.25 mg/dL — AB (ref 0.61–1.24)
Calcium: 7 mg/dL — ABNORMAL LOW (ref 8.9–10.3)
Chloride: 99 mmol/L (ref 98–111)
GFR calc Af Amer: 6 mL/min — ABNORMAL LOW (ref >60)
GFR calc non Af Amer: 5 mL/min — ABNORMAL LOW (ref >60)
Glucose, Bld: 79 mg/dL (ref 70–99)
Phosphorus: 3 mg/dL (ref 2.5–4.6)
Potassium: 3.7 mmol/L (ref 3.5–5.1)
Sodium: 136 mmol/L (ref 135–145)

## 2018-08-10 LAB — PROTIME-INR
INR: 1.14
Prothrombin Time: 14.5 seconds (ref 11.4–15.2)

## 2018-08-10 LAB — TROPONIN I
Troponin I: 0.05 ng/mL (ref <0.03)
Troponin I: 0.04 ng/mL (ref <0.03)

## 2018-08-10 LAB — TSH: TSH: 2 u[IU]/mL (ref 0.350–4.500)

## 2018-08-10 LAB — T4, FREE: FREE T4: 0.94 ng/dL (ref 0.82–1.77)

## 2018-08-10 IMAGING — CT CT ANGIO CHEST
2 of 7 series · 18 of 46 positions shown · IV contrast (APPLIED)
Comparison: [DATE] CT

CLINICAL DATA: Chest pain with weakness and dyspnea

EXAM:
CT ANGIOGRAPHY CHEST WITH CONTRAST
TECHNIQUE: Multidetector CT imaging of the chest was performed using the
standard protocol during bolus administration of intravenous
contrast. Multiplanar CT image reconstructions and MIPs were
obtained to evaluate the vascular anatomy.
CONTRAST:  80 cc [KL] IOPAMIDOL ([KL]) INJECTION 76%

[Series 7: thins · axial · 0.76mm/px · z∈[+1059,+1375]mm · 15 of 509 slices shown]
[im 29/509  lung]
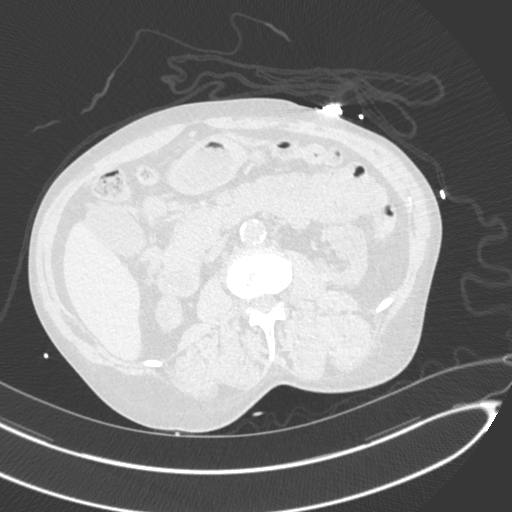
[im 57/509  soft-tissue]
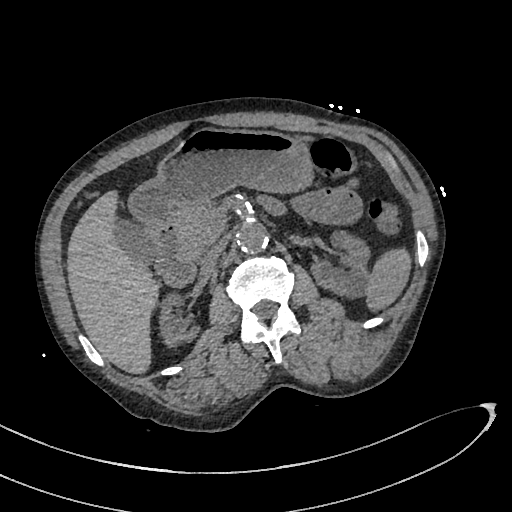
[im 85/509  lung]
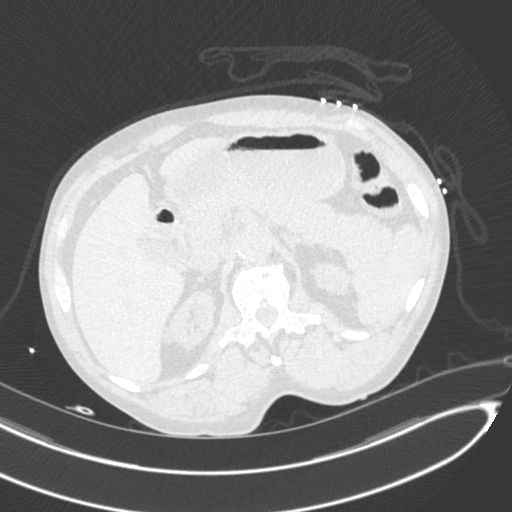
[im 113/509  soft-tissue]
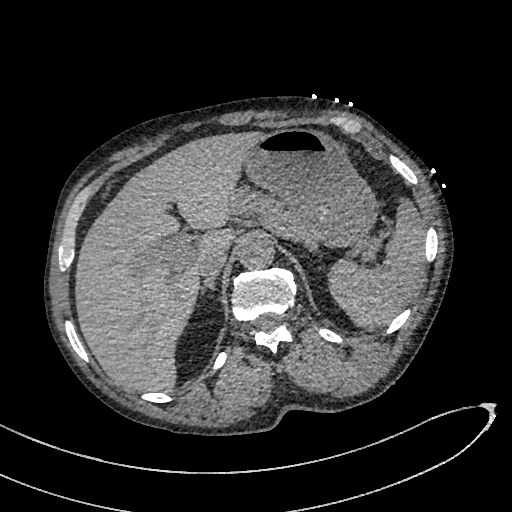
[im 170/509  lung]
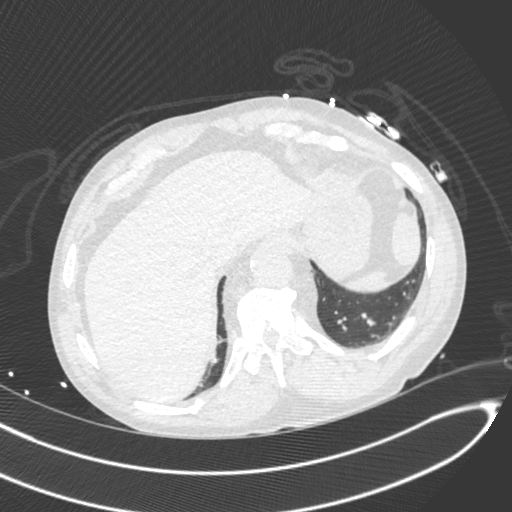
[im 198/509  soft-tissue]
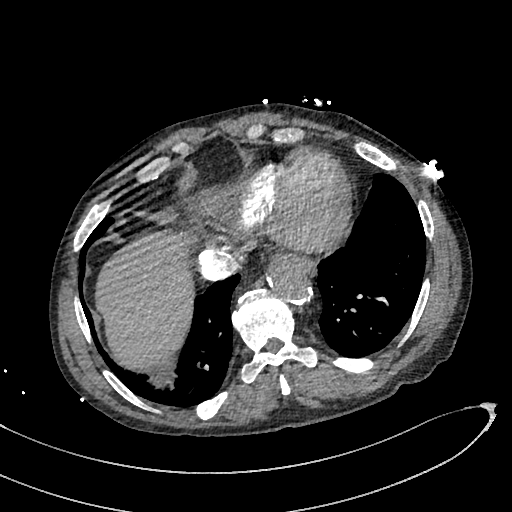
[im 226/509  lung]
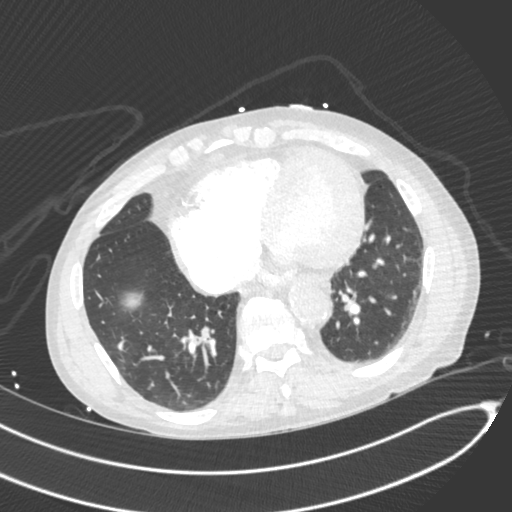
[im 255/509  soft-tissue]
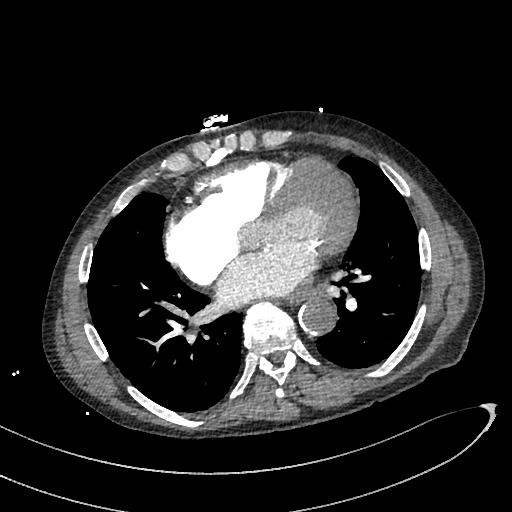
[im 283/509  lung]
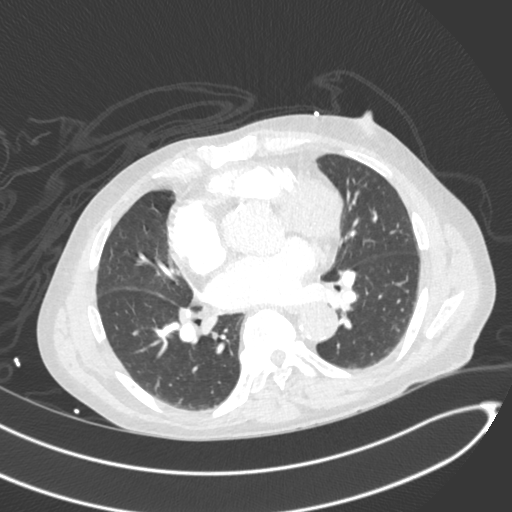
[im 311/509  soft-tissue]
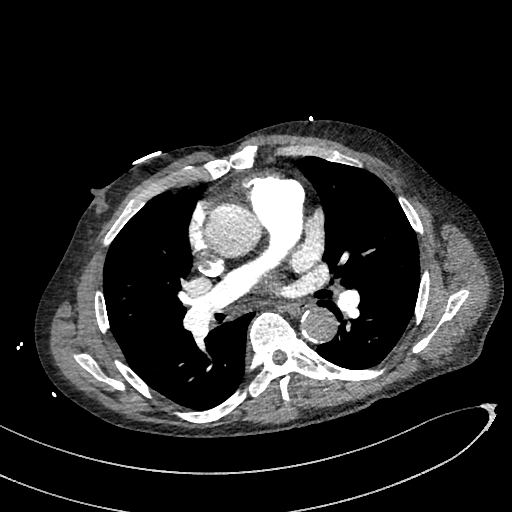
[im 339/509  lung]
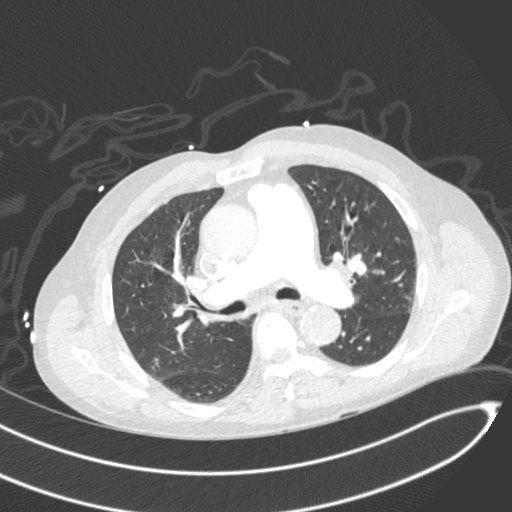
[im 396/509  soft-tissue]
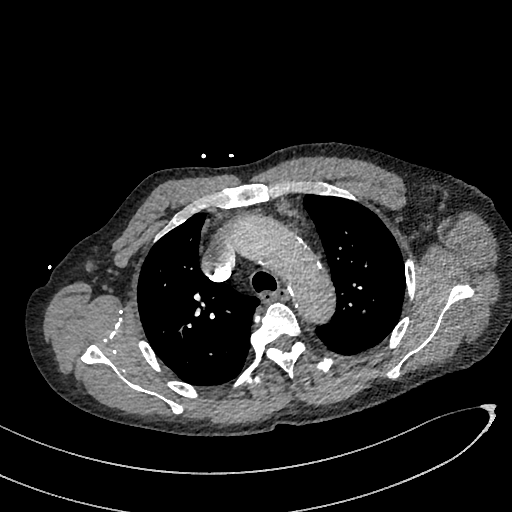
[im 424/509  lung]
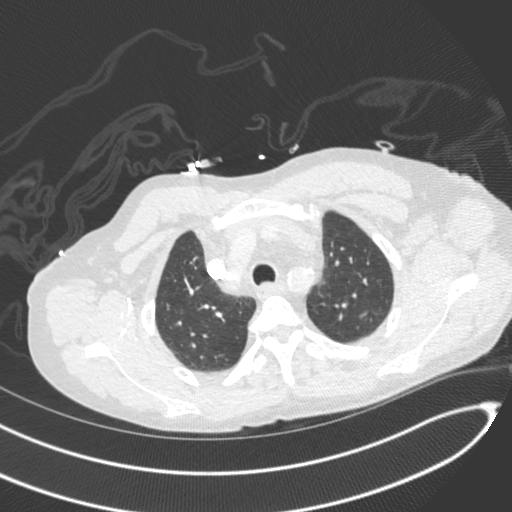
[im 452/509  soft-tissue]
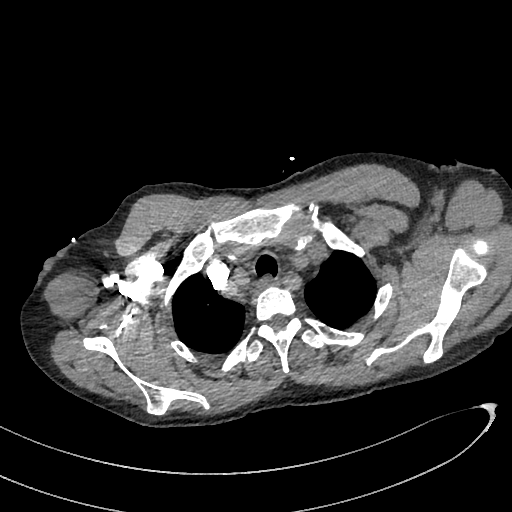
[im 480/509  lung]
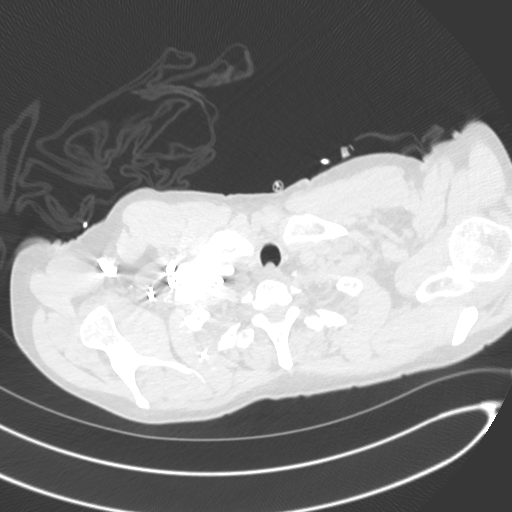

[Series 8: cor · coronal · 0.71mm/px · 3 of 137 slices shown]
[im 35/137  soft-tissue]
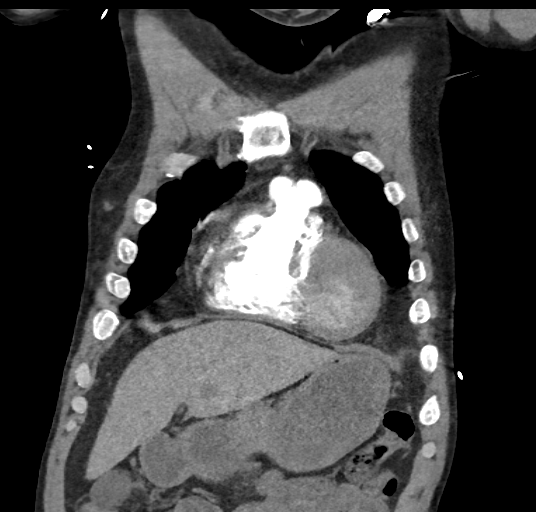
[im 69/137  soft-tissue]
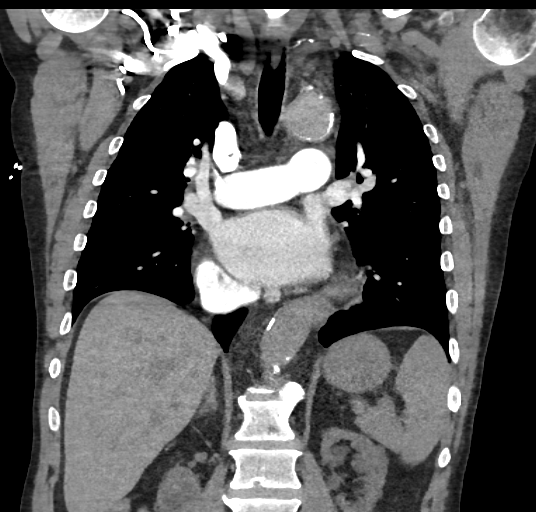
[im 103/137  soft-tissue]
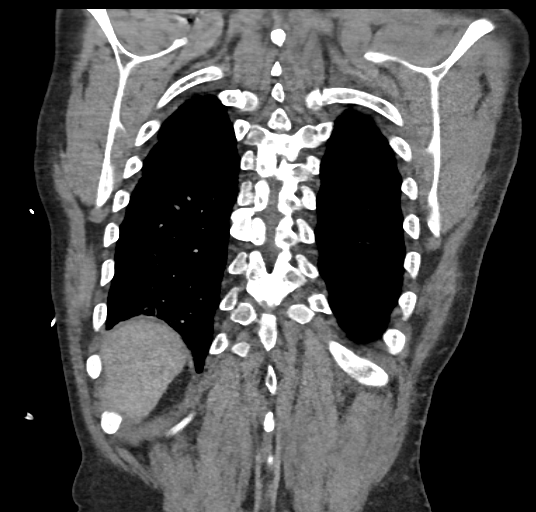

[18 of 46 positions shown; findings below may reference images not displayed]

FINDINGS: Cardiovascular: Left main, LAD and RCA coronary arteriosclerosis.
Cardiomegaly without pericardial effusion or thickening is noted.
Aneurysmal dilatation of the ascending thoracic aorta is noted to
4.4 cm at the level of the main pulmonary artery. This is without
significant change. Dilatation of the main pulmonary arteries
identified 4 cm compatible with chronic pulmonary hypertension.
Satisfactory opacification to the segmental arterial level without
acute pulmonary embolus is noted. Preferential opacification of the
pulmonary arteries. No definite aortic dissection. Atherosclerosis
at the origins of the great vessels with conventional branch
pattern.

Mediastinum/Nodes: No enlarged mediastinal, hilar, or axillary lymph
nodes. Thyroid gland, trachea, and esophagus demonstrate no
significant findings.

Lungs/Pleura: Atelectasis at the lung bases. No pneumothorax. No
acute pulmonary consolidation, edema or dominant mass.

Upper Abdomen: Atrophic right kidney with probable nonobstructing or
renovascular calcifications. Small cortical and parapelvic cysts are
noted bilaterally. The largest cyst is seen in the interpolar left
kidney measuring up to 2.7 cm. The liver, included pancreas, spleen
and adrenal glands are unremarkable. The gallbladder is free of
stones. No bowel obstruction or inflammation.

Musculoskeletal: Lower thoracic spondylosis with multilevel
degenerative disc disease. No acute nor suspicious osseous lesions.

Review of the MIP images confirms the above findings.
IMPRESSION: 1. Ascending thoracic aortic aneurysm measuring 4.4 cm at the level
of the main pulmonary artery. Recommend annual imaging followup by
CTA or MRA. This recommendation follows [KL]
ACCF/AHA/AATS/ACR/ASA/SCA/PONTJO/PONTJO/PONTJO/PONTJO Guidelines for the
Diagnosis and Management of Patients with Thoracic Aortic Disease.
[KL]; 121: [id number].
2. No acute pulmonary embolus.
3. Dilated main pulmonary artery to 4 cm compatible with chronic
pulmonary hypertension.
4. Bilateral renal cysts with atrophy of the right kidney.
5. Lower thoracic spondylosis.

Aortic Atherosclerosis ([KL]-[KL]).

Aortic aneurysm NOS ([KL]-[KL]).

## 2018-08-10 MED ORDER — ACETAMINOPHEN 325 MG PO TABS
650.0000 mg | ORAL_TABLET | ORAL | Status: DC | PRN
  Administered 2018-08-11 – 2018-08-12 (×3): 650 mg via ORAL
  Filled 2018-08-10 (×4): qty 2

## 2018-08-10 MED ORDER — CHLORHEXIDINE GLUCONATE CLOTH 2 % EX PADS: 6.0000 | MEDICATED_PAD | Freq: Every day | CUTANEOUS | Status: DC

## 2018-08-10 MED ORDER — CINACALCET HCL 30 MG PO TABS
30.0000 mg | ORAL_TABLET | Freq: Every day | ORAL | Status: DC
  Administered 2018-08-10: 30 mg via ORAL
  Filled 2018-08-10: qty 1

## 2018-08-10 MED ORDER — SODIUM CHLORIDE 0.9 % IV BOLUS
500.0000 mL | Freq: Once | INTRAVENOUS | Status: AC
  Administered 2018-08-10: 500 mL via INTRAVENOUS

## 2018-08-10 MED ORDER — WARFARIN - PHARMACIST DOSING INPATIENT
Freq: Every day | Status: DC
  Administered 2018-08-12: 18:00:00

## 2018-08-10 MED ORDER — ONDANSETRON HCL 4 MG/2ML IJ SOLN: 4.0000 mg | Freq: Four times a day (QID) | INTRAMUSCULAR | Status: DC | PRN

## 2018-08-10 MED ORDER — AMIODARONE IV BOLUS ONLY 150 MG/100ML
150.0000 mg | Freq: Once | INTRAVENOUS | Status: AC
  Administered 2018-08-10: 150 mg via INTRAVENOUS
  Filled 2018-08-10: qty 100

## 2018-08-10 MED ORDER — IOPAMIDOL (ISOVUE-370) INJECTION 76%
INTRAVENOUS | Status: AC
  Administered 2018-08-10: 100 mL
  Filled 2018-08-10: qty 100

## 2018-08-10 MED ORDER — SODIUM CHLORIDE 0.9 % IV BOLUS
250.0000 mL | Freq: Once | INTRAVENOUS | Status: AC
  Administered 2018-08-10: 250 mL via INTRAVENOUS

## 2018-08-10 MED ORDER — TRAZODONE HCL 50 MG PO TABS
50.0000 mg | ORAL_TABLET | Freq: Once | ORAL | Status: AC
  Administered 2018-08-10: 50 mg via ORAL
  Filled 2018-08-10: qty 1

## 2018-08-10 MED ORDER — CALCITRIOL 0.5 MCG PO CAPS
0.7500 ug | ORAL_CAPSULE | ORAL | Status: DC
  Administered 2018-08-10: 0.75 ug via ORAL
  Filled 2018-08-10: qty 1

## 2018-08-10 MED ORDER — SEVELAMER CARBONATE 800 MG PO TABS
4800.0000 mg | ORAL_TABLET | Freq: Three times a day (TID) | ORAL | Status: DC
  Administered 2018-08-10 – 2018-08-12 (×2): 4800 mg via ORAL
  Filled 2018-08-10 (×7): qty 6

## 2018-08-10 MED ORDER — SODIUM BICARBONATE 8.4 % IV SOLN
25.0000 meq | Freq: Once | INTRAVENOUS | Status: AC
  Administered 2018-08-10: 25 meq via INTRAVENOUS
  Filled 2018-08-10: qty 50

## 2018-08-10 MED ORDER — HEPARIN (PORCINE) 25000 UT/250ML-% IV SOLN
1350.0000 [IU]/h | INTRAVENOUS | Status: DC
  Administered 2018-08-10: 1000 [IU]/h via INTRAVENOUS
  Administered 2018-08-12: 1350 [IU]/h via INTRAVENOUS
  Filled 2018-08-10 (×3): qty 250

## 2018-08-10 MED ORDER — SODIUM CHLORIDE 0.9 % IV SOLN
INTRAVENOUS | Status: DC
  Administered 2018-08-10: 75 mL/h via INTRAVENOUS

## 2018-08-10 MED ORDER — DARBEPOETIN ALFA 100 MCG/0.5ML IJ SOSY: 100.0000 ug | PREFILLED_SYRINGE | INTRAMUSCULAR | Status: DC

## 2018-08-10 MED ORDER — AMIODARONE HCL IN DEXTROSE 360-4.14 MG/200ML-% IV SOLN
30.0000 mg/h | INTRAVENOUS | Status: DC
  Administered 2018-08-10 – 2018-08-11 (×5): 30 mg/h via INTRAVENOUS
  Filled 2018-08-10 (×4): qty 200

## 2018-08-10 MED ORDER — WARFARIN SODIUM 5 MG PO TABS
5.0000 mg | ORAL_TABLET | Freq: Once | ORAL | Status: AC
  Administered 2018-08-10: 5 mg via ORAL
  Filled 2018-08-10 (×2): qty 1

## 2018-08-10 MED ORDER — AMIODARONE HCL IN DEXTROSE 360-4.14 MG/200ML-% IV SOLN
60.0000 mg/h | INTRAVENOUS | Status: DC
  Administered 2018-08-10: 60 mg/h via INTRAVENOUS
  Filled 2018-08-10 (×2): qty 200

## 2018-08-10 NOTE — Progress Notes (Signed)
ANTICOAGULATION CONSULT NOTE - Initial Consult  Pharmacy Consult for Coumadin Indication: atrial fibrillation  No Known Allergies  Patient Measurements: Height: 5\' 8"  (172.7 cm) Weight: 154 lb 5.2 oz (70 kg) IBW/kg (Calculated) : 68.4  Vital Signs: Temp: 97.7 F (36.5 C) (01/30 2143) Temp Source: Oral (01/30 2143) BP: 101/73 (01/31 0445) Pulse Rate: 78 (01/31 0445)  Labs: Recent Labs    08/09/18 2226 08/10/18 0341  HGB 9.9*  --   HCT 32.9*  --   PLT 120*  --   LABPROT  --  14.5  INR  --  1.14  CREATININE 9.16* 10.25*    Estimated Creatinine Clearance: 7 mL/min (A) (by C-G formula based on SCr of 10.25 mg/dL (H)).   Medical History: Past Medical History:  Diagnosis Date  . Anemia   . CAD (coronary artery disease)    a. 03/2018: cath showing 20 to 30% stenosis along the LAD, luminal irregularities along the RCA, and angiographically normal LCx  . COPD (chronic obstructive pulmonary disease) ([place])   . Dyspnea    "with too much fluid"  . ESRD (end stage renal disease) on dialysis [facility])    "MWF; Jeneen Rinks" (07/22/17)  . Hemodialysis patient ([place])   . Hepatitis C    Still positive s/p liver biopsy at [facility]  and interferon therapy for 6 months. Most recent lab work was on 10/24/12  . Hepatitis C    "took the tx; gone now" (12/05/2016)  Was treated  . History of blood transfusion ~ 2012/2013   "related to my kidneys; blood was low"  . Hypertension   . Substance abuse ([place])   . Thyroid disease     Medications:  Current Facility-Administered Medications on File Prior to Encounter  Medication Dose Route Frequency Provider Last Rate Last Dose  . midazolam (VERSED) 5 MG/5ML injection    Anesthesia Intra-op Sammie Bench, CRNA   2 mg at 04/11/18 1042   Current Outpatient Medications on File Prior to Encounter  Medication Sig Dispense Refill  . acetaminophen (TYLENOL) 325 MG tablet Take 650 mg by mouth every 6 (six) hours as needed for mild pain.    [name]  amiodarone (PACERONE) 200 MG tablet Take 1 tablet (200 mg total) by mouth daily. 90 tablet 3  . amLODipine (NORVASC) 2.5 MG tablet Take 1 tablet (2.5 mg total) by mouth daily. 90 tablet 3  . metoprolol succinate (TOPROL-XL) 50 MG 24 hr tablet Take 1 tablet (50 mg total) by mouth daily. Take with or immediately following a meal. 90 tablet 3  . ondansetron (ZOFRAN) 4 MG tablet Take 1 tablet (4 mg total) by mouth every 8 (eight) hours as needed for nausea or vomiting. 20 tablet 0  . pantoprazole (PROTONIX) 40 MG tablet Take 40 mg by mouth daily.    . sevelamer carbonate (RENVELA) 800 MG tablet Take 4,800 mg by mouth 3 (three) times daily with meals.     . cinacalcet (SENSIPAR) 30 MG tablet Take 1 tablet (30 mg total) by mouth daily. (Patient not taking: Reported on 08/10/2018) 60 tablet 0  . [DISCONTINUED] albuterol (PROVENTIL HFA;VENTOLIN HFA) 108 (90 Base) MCG/ACT inhaler Inhale 2 puffs into the lungs every 6 (six) hours as needed for wheezing or shortness of breath. (Patient not taking: Reported on 08/02/2018) 1 Inhaler 2  . [DISCONTINUED] atorvastatin (LIPITOR) 40 MG tablet Take 1 tablet (40 mg total) by mouth daily. (Patient not taking: Reported on 08/02/2018) 90 tablet 3  . [DISCONTINUED] calcitRIOL (ROCALTROL) 0.25 MCG  capsule Take 3 capsules (0.75 mcg total) by mouth every Monday, Wednesday, and Friday with hemodialysis. (Patient not taking: Reported on 08/02/2018) 30 capsule 3  . [DISCONTINUED] Darbepoetin Alfa (ARANESP) 100 MCG/0.5ML SOSY injection Inject 0.5 mLs (100 mcg total) into the vein every Wednesday with hemodialysis. (Patient not taking: Reported on 08/02/2018) 4.2 mL 3  . [DISCONTINUED] multivitamin (RENA-VIT) TABS tablet Take 1 tablet by mouth at bedtime. (Patient not taking: Reported on 08/02/2018) 30 tablet 3  . [DISCONTINUED] warfarin (COUMADIN) 5 MG tablet Take 1 tablet (5 mg total) by mouth one time only at 6 PM. (Patient not taking: Reported on 08/10/2018) 30 tablet 0      Assessment: 63 y.o. male admitted with chest pain, h/o Afib, to continue Coumadin  Goal of Therapy:  INR 2-3 Monitor platelets by anticoagulation protocol: Yes   Plan:  Coumadin 5 mg daily Daily INR  Clifford Benninger, Bronson Curb 08/10/2018,4:52 AM

## 2018-08-10 NOTE — ED Provider Notes (Signed)
Case d/w fellow who will place patient on rounding list for am   Raymond Heinemann, MD 08/10/18 5927

## 2018-08-10 NOTE — ED Notes (Signed)
CRITICAL VALUE ALERT  Critical Value:  Troponin 0.05  Date & Time Notied:  08/10/18- 1304  Provider Notified: Dr. Thereasa Solo   Orders Received/Actions taken: None at this time.

## 2018-08-10 NOTE — ED Notes (Signed)
Patient transported to CT 

## 2018-08-10 NOTE — ED Notes (Signed)
Pt converted back to sinus rhythm.  EKG obtained.

## 2018-08-10 NOTE — Consult Note (Signed)
[facility] Renal Consultation Note    Indication for Consultation:  Management of ESRD/hemodialysis, anemia, hypertension/volume, and secondary hyperparathyroidism. PCP:  HPI: Raymond [name] is a 63 y.o. male with ESRD, HTN, CAD, Hx treated Hep C, ongoing substance abuse, and homelessness who was admitted with A-fib RVR.  Brought to ED via EMS from homeless shelter on the evening of 1/30 with CP and weakness. Found to be hypotensive and tachycardic. EKG showed A-flutter with 2:1 block. Given cardizem and IV fluids. He had reported being weak/wiped out after the past 2 dialysis sessions. Labs with K 3, Ca 6.7, Alb 3, WBC 3.7, Hgb 9.9, trop 0.03. CXR clear. He later underwent CT angio without any acute findings - did note 4.4cm ascending thoracic aortic aneurysm (which will need annual f/u) and pulmonary HTN.  Cardiology consulted, started on amiodarone drip - now converted back to NSR. Hx substance abuse, denies using immediately prior to admit.  Seen in ED bed, resting quietly. At this time, CP and dyspnea have resolved. No N/V or diarrhea. Afebrile.  Dialyzes on MWF schedule at [facility]. Last HD 1/29 which was cut early after pt signed off in setting of low BP. EDW was raised after this event.  Past Medical History:  Diagnosis Date  . Anemia   . CAD (coronary artery disease)    a. 03/2018: cath showing 20 to 30% stenosis along the LAD, luminal irregularities along the RCA, and angiographically normal LCx  . COPD (chronic obstructive pulmonary disease) ([place])   . Dyspnea    "with too much fluid"  . ESRD (end stage renal disease) on dialysis [facility])    "MWF; Jeneen Rinks" (07/22/17)  . Hemodialysis patient ([place])   . Hepatitis C    Still positive s/p liver biopsy at [facility]  and interferon therapy for 6 months. Most recent lab work was on 10/24/12  . Hepatitis C    "took the tx; gone now" (12/05/2016)  Was treated  . History of blood transfusion ~ 2012/2013   "related to my kidneys;  blood was low"  . Hypertension   . Substance abuse ([place])   . Thyroid disease    Past Surgical History:  Procedure Laterality Date  . ABDOMINAL AORTOGRAM W/LOWER EXTREMITY N/A 02/08/2018   Procedure: ABDOMINAL AORTOGRAM W/LOWER EXTREMITY;  Surgeon: Conrad [place], MD;  Location: [place] [facility];  Service: Cardiovascular;  Laterality: N/A;  . AV FISTULA PLACEMENT Left Aug. 2013 ?  . FEMORAL-POPLITEAL BYPASS GRAFT Left 04/11/2018   Procedure: BYPASS GRAFT FEMORAL-POPLITEAL ARTERY LEFT LEG;  Surgeon: Rosetta Posner, MD;  Location: [place];  Service: Vascular;  Laterality: Left;  . LEFT HEART CATH AND CORONARY ANGIOGRAPHY N/A 03/29/2018   Procedure: LEFT HEART CATH AND CORONARY ANGIOGRAPHY;  Surgeon: Nelva Bush, MD;  Location: [place] [facility];  Service: Cardiovascular;  Laterality: N/A;  . LIVER BIOPSY    . ORIF ULNAR FRACTURE Left 05/18/2017   Procedure: OPEN REDUCTION INTERNAL FIXATION (ORIF) ULNAR FRACTURE;  Surgeon: Iran Planas, MD;  Location: [place];  Service: Orthopedics;  Laterality: Left;  . SHUNTOGRAM N/A 12/11/2012   Procedure: Earney Mallet;  Surgeon: Serafina Mitchell, MD;  Location: [facility] CATH LAB;  Service: Cardiovascular;  Laterality: N/A;   Family History  Problem Relation Age of Onset  . Diabetes Father   . Hypertension Father   . Heart disease Father    Social History:  reports that he has been smoking cigarettes. He has a 25.00 pack-year smoking history. He has never  used smokeless tobacco. He reports current alcohol use. He reports current drug use. Drug: Cocaine.  ROS: As per HPI otherwise negative.  Physical Exam: Vitals:   08/10/18 1045 08/10/18 1130 08/10/18 1145 08/10/18 1230  BP: (!) 149/90 (!) 134/93 123/69 115/74  Pulse: 80 76 69 72  Resp: 19 17 13 13   Temp:      TempSrc:      SpO2: 98% 97% 100% 100%  Weight:      Height:         General: Well developed, well nourished, in no acute distress. Head: Normocephalic, atraumatic, sclera non-icteric, mucus  membranes are moist. Neck: Supple without lymphadenopathy/masses. JVD not elevated. Lungs: Clear bilaterally to auscultation without wheezes, rales, or rhonchi. Breathing is unlabored. Heart: RRR with normal S1, S2. No murmurs, rubs, or gallops appreciated. Abdomen: Soft, non-tender, non-distended with normoactive bowel sounds. No rebound/guarding.  Musculoskeletal:  Strength and tone appear normal for age. Lower extremities: No edema or ischemic changes, no open wounds. Neuro: Alert and oriented X 3. Moves all extremities spontaneously. Psych:  Responds to questions appropriately with a normal affect. Dialysis Access: AVF + bruit  No Known Allergies Prior to Admission medications   Medication Sig Start Date End Date Taking? Authorizing Provider  acetaminophen (TYLENOL) 325 MG tablet Take 650 mg by mouth every 6 (six) hours as needed for mild pain.   Yes [provider]  amiodarone (PACERONE) 200 MG tablet Take 1 tablet (200 mg total) by mouth daily. 05/15/18  Yes Isaiah Serge, NP  amLODipine (NORVASC) 2.5 MG tablet Take 1 tablet (2.5 mg total) by mouth daily. 05/15/18 08/13/18 Yes Isaiah Serge, NP  metoprolol succinate (TOPROL-XL) 50 MG 24 hr tablet Take 1 tablet (50 mg total) by mouth daily. Take with or immediately following a meal. 05/15/18 08/13/18 Yes Isaiah Serge, NP  ondansetron (ZOFRAN) 4 MG tablet Take 1 tablet (4 mg total) by mouth every 8 (eight) hours as needed for nausea or vomiting. 04/03/18  Yes Thurnell Lose, MD  pantoprazole (PROTONIX) 40 MG tablet Take 40 mg by mouth daily.   Yes [provider]  sevelamer carbonate (RENVELA) 800 MG tablet Take 4,800 mg by mouth 3 (three) times daily with meals.    Yes [provider]  cinacalcet (SENSIPAR) 30 MG tablet Take 1 tablet (30 mg total) by mouth daily. Patient not taking: Reported on 08/10/2018 08/05/18   Cristal Deer, MD   Current Facility-Administered Medications  Medication Dose Route  Frequency Provider Last Rate Last Dose  . 0.9 %  sodium chloride infusion   Intravenous Continuous Shela Leff, MD 75 mL/hr at 08/10/18 0411 75 mL/hr at 08/10/18 0411  . acetaminophen (TYLENOL) tablet 650 mg  650 mg Oral Q4H PRN Shela Leff, MD      . amiodarone (NEXTERONE PREMIX) 360-4.14 MG/200ML-% (1.8 mg/mL) IV infusion  30 mg/hr Intravenous Continuous Shela Leff, MD 16.67 mL/hr at 08/10/18 1012 30 mg/hr at 08/10/18 1012  . calcitRIOL (ROCALTROL) capsule 0.75 mcg  0.75 mcg Oral Q M,W,F-HD Shela Leff, MD   0.75 mcg at 08/10/18 1308  . cinacalcet (SENSIPAR) tablet 30 mg  30 mg Oral Daily Shela Leff, MD   30 mg at 08/10/18 1308  . ondansetron (ZOFRAN) injection 4 mg  4 mg Intravenous Q6H PRN Shela Leff, MD      . sevelamer carbonate (RENVELA) tablet 4,800 mg  4,800 mg Oral TID WC Shela Leff, MD   Stopped at 08/10/18 1030  . warfarin (COUMADIN)  tablet 5 mg  5 mg Oral ONCE-1800 Shela Leff, MD      . Warfarin - Pharmacist Dosing Inpatient   Does not apply [id number] Shela Leff, MD       Current Outpatient Medications  Medication Sig Dispense Refill  . acetaminophen (TYLENOL) 325 MG tablet Take 650 mg by mouth every 6 (six) hours as needed for mild pain.    [name] amiodarone (PACERONE) 200 MG tablet Take 1 tablet (200 mg total) by mouth daily. 90 tablet 3  . amLODipine (NORVASC) 2.5 MG tablet Take 1 tablet (2.5 mg total) by mouth daily. 90 tablet 3  . metoprolol succinate (TOPROL-XL) 50 MG 24 hr tablet Take 1 tablet (50 mg total) by mouth daily. Take with or immediately following a meal. 90 tablet 3  . ondansetron (ZOFRAN) 4 MG tablet Take 1 tablet (4 mg total) by mouth every 8 (eight) hours as needed for nausea or vomiting. 20 tablet 0  . pantoprazole (PROTONIX) 40 MG tablet Take 40 mg by mouth daily.    . sevelamer carbonate (RENVELA) 800 MG tablet Take 4,800 mg by mouth 3 (three) times daily with meals.     . cinacalcet (SENSIPAR) 30 MG  tablet Take 1 tablet (30 mg total) by mouth daily. (Patient not taking: Reported on 08/10/2018) 60 tablet 0   Facility-Administered Medications Ordered in Other Encounters  Medication Dose Route Frequency Provider Last Rate Last Dose  . midazolam (VERSED) 5 MG/5ML injection    Anesthesia Intra-op Sammie Bench, CRNA   2 mg at 04/11/18 1042   Labs: Basic Metabolic Panel: Recent Labs  Lab 08/09/18 2226 08/10/18 0341  NA 138 136  K 3.7 3.7  CL 105 99  CO2 17* 21*  GLUCOSE 86 79  BUN 44* 49*  CREATININE 9.16* 10.25*  CALCIUM 6.7* 7.0*  PHOS  --  3.0   Liver Function Tests: Recent Labs  Lab 08/09/18 2226 08/10/18 0341  AST 31  --   ALT 18  --   ALKPHOS 56  --   BILITOT 0.8  --   PROT 6.4*  --   ALBUMIN 3.0* 2.9*   Recent Labs  Lab 08/10/18 0341  LIPASE 104*   CBC: Recent Labs  Lab 08/04/18 0253 08/09/18 2226  WBC 3.0* 3.7*  HGB 10.9* 9.9*  HCT 34.3* 32.9*  MCV 98.0 104.8*  PLT 96* 120*   Cardiac Enzymes: Recent Labs  Lab 08/10/18 1135  TROPONINI 0.05*   Studies/Results: Dg Chest 2 View  Result Date: 08/09/2018 CLINICAL DATA:  Weakness and cough.  Fever. EXAM: CHEST - 2 VIEW COMPARISON:  08/01/2018, 07/12/2018, 04/15/2018 FINDINGS: The lungs are well inflated. There is mild cardiomegaly. There is no focal airspace consolidation or pulmonary edema. There is no pleural effusion or pneumothorax. IMPRESSION: Mild cardiomegaly without acute airspace disease. Electronically Signed   By: Ulyses Jarred M.D.   On: 08/09/2018 22:42   Ct Angio Chest Pe W And/or Wo Contrast  Result Date: 08/10/2018 CLINICAL DATA:  Chest pain with weakness and dyspnea EXAM: CT ANGIOGRAPHY CHEST WITH CONTRAST TECHNIQUE: Multidetector CT imaging of the chest was performed using the standard protocol during bolus administration of intravenous contrast. Multiplanar CT image reconstructions and MIPs were obtained to evaluate the vascular anatomy. CONTRAST:  80 cc ISOVUE-370 IOPAMIDOL  (ISOVUE-370) INJECTION 76% COMPARISON:  06/12/2017 CT FINDINGS: Cardiovascular: Left main, LAD and RCA coronary arteriosclerosis. Cardiomegaly without pericardial effusion or thickening is noted. Aneurysmal dilatation of the ascending thoracic aorta is noted to 4.4  cm at the level of the main pulmonary artery. This is without significant change. Dilatation of the main pulmonary arteries identified 4 cm compatible with chronic pulmonary hypertension. Satisfactory opacification to the segmental arterial level without acute pulmonary embolus is noted. Preferential opacification of the pulmonary arteries. No definite aortic dissection. Atherosclerosis at the origins of the great vessels with conventional branch pattern. Mediastinum/Nodes: No enlarged mediastinal, hilar, or axillary lymph nodes. Thyroid gland, trachea, and esophagus demonstrate no significant findings. Lungs/Pleura: Atelectasis at the lung bases. No pneumothorax. No acute pulmonary consolidation, edema or dominant mass. Upper Abdomen: Atrophic right kidney with probable nonobstructing or renovascular calcifications. Small cortical and parapelvic cysts are noted bilaterally. The largest cyst is seen in the interpolar left kidney measuring up to 2.7 cm. The liver, included pancreas, spleen and adrenal glands are unremarkable. The gallbladder is free of stones. No bowel obstruction or inflammation. Musculoskeletal: Lower thoracic spondylosis with multilevel degenerative disc disease. No acute nor suspicious osseous lesions. Review of the MIP images confirms the above findings. IMPRESSION: 1. Ascending thoracic aortic aneurysm measuring 4.4 cm at the level of the main pulmonary artery. Recommend annual imaging followup by CTA or MRA. This recommendation follows 2010 ACCF/AHA/AATS/ACR/ASA/SCA/SCAI/SIR/STS/SVM Guidelines for the Diagnosis and Management of Patients with Thoracic Aortic Disease. 2010; 121: J673-A193. 2. No acute pulmonary embolus. 3. Dilated  main pulmonary artery to 4 cm compatible with chronic pulmonary hypertension. 4. Bilateral renal cysts with atrophy of the right kidney. 5. Lower thoracic spondylosis. Aortic Atherosclerosis (ICD10-I70.0). Aortic aneurysm NOS (ICD10-I71.9). Electronically Signed   By: Ashley Royalty M.D.   On: 08/10/2018 00:48    Dialysis Orders:  MWF at [facility] 4hr, 450/800, EDW 72kg (just raised from 71.5), 2K/2.5Ca, UF profile 4, AVF, heparin 2000 - Parsabiv 30mcg IV q HD - Mircera 151mcg IV q 2 weeks (ordered, not yet given) - Calcitriol 1.45mcg PO q HD  Assessment/Plan: 1.  A-flutter with RVR: Now controlled, back in NSR. Remains on amiodarone drip. 2.  ESRD: Usual MWF schedule. For HD today (may need to bump to evening for schedule) - will plan for low UF goal since patient reports rough HD for past week. 3.  Hypertension/volume: BP good, no edema. 4.  Anemia: Hgb 9.9 - will start ESA. 5.  Metabolic bone disease: Corr Ca lowish side/Phos ok. Sensipar given this AM, will hold. 6.  CAD  Veneta Penton, PA-C 08/10/2018, 2:03 PM  [facility] Pager: [phone]

## 2018-08-10 NOTE — ED Notes (Signed)
Ordered Tray for PT

## 2018-08-10 NOTE — H&P (Signed)
History and Physical    Raymond [name] RFF:[id number] DOB: 1955/11/11 DOA: 08/09/2018  PCP: Scot Jun, FNP  Chief Complaint: Chest pain  HPI: Raymond [name] is a 63 y.o. male with medical history significant of nonobstructive CAD, ESRD on HD, COPD, hypertension, hypothyroidism, anemia presenting via EMS from a shelter for evaluation of chest pain, hypotension, and tachycardia.  Hypotensive with blood pressure in the 70s over 40s, tachycardic with pulse in the 130s per EMS.  Patient was a poor historian and it was difficult to obtain a thorough history from him.  States he went for dialysis on Monday and his blood pressure dropped.  States since then he has been feeling weak.  He then went back for dialysis yesterday and again his blood pressure dropped and he continued to feel weak.  States today he was walking and experienced left-sided stabbing chest pain associated with shortness of breath and generalized weakness.  States he had an episode of vomiting at that time and diarrhea when he used the bathroom.  Denies having any heart palpitations.  No additional history could be obtained from the patient.  Review of Systems: As per HPI otherwise 10 point review of systems negative.  Past Medical History:  Diagnosis Date  . Anemia   . CAD (coronary artery disease)    a. 03/2018: cath showing 20 to 30% stenosis along the LAD, luminal irregularities along the RCA, and angiographically normal LCx  . COPD (chronic obstructive pulmonary disease) ([place])   . Dyspnea    "with too much fluid"  . ESRD (end stage renal disease) on dialysis [facility]    "MWF; Jeneen Rinks" (07/22/17)  . Hemodialysis patient ([place])   . Hepatitis C    Still positive s/p liver biopsy at [facility]  and interferon therapy for 6 months. Most recent lab work was on 10/24/12  . Hepatitis C    "took the tx; gone now" (12/05/2016)  Was treated  . History of blood transfusion ~ 2012/2013   "related to my kidneys; blood was low"  .  Hypertension   . Substance abuse ([place])   . Thyroid disease     Past Surgical History:  Procedure Laterality Date  . ABDOMINAL AORTOGRAM W/LOWER EXTREMITY N/A 02/08/2018   Procedure: ABDOMINAL AORTOGRAM W/LOWER EXTREMITY;  Surgeon: Conrad Redlands, MD;  Location: [place] CV LAB;  Service: Cardiovascular;  Laterality: N/A;  . AV FISTULA PLACEMENT Left Aug. 2013 ?  . FEMORAL-POPLITEAL BYPASS GRAFT Left 04/11/2018   Procedure: BYPASS GRAFT FEMORAL-POPLITEAL ARTERY LEFT LEG;  Surgeon: Rosetta Posner, MD;  Location: [place];  Service: Vascular;  Laterality: Left;  . LEFT HEART CATH AND CORONARY ANGIOGRAPHY N/A 03/29/2018   Procedure: LEFT HEART CATH AND CORONARY ANGIOGRAPHY;  Surgeon: Nelva Bush, MD;  Location: [place] CV LAB;  Service: Cardiovascular;  Laterality: N/A;  . LIVER BIOPSY    . ORIF ULNAR FRACTURE Left 05/18/2017   Procedure: OPEN REDUCTION INTERNAL FIXATION (ORIF) ULNAR FRACTURE;  Surgeon: Iran Planas, MD;  Location: [place];  Service: Orthopedics;  Laterality: Left;  . SHUNTOGRAM N/A 12/11/2012   Procedure: Earney Mallet;  Surgeon: Serafina Mitchell, MD;  Location: [facility] CATH LAB;  Service: Cardiovascular;  Laterality: N/A;     reports that he has been smoking cigarettes. He has a 25.00 pack-year smoking history. He has never used smokeless tobacco. He reports current alcohol use. He reports current drug use. Drug: Cocaine.  No Known Allergies  Family History  Problem Relation Age of Onset  .  Diabetes Father   . Hypertension Father   . Heart disease Father     Prior to Admission medications   Medication Sig Start Date End Date Taking? Authorizing Provider  acetaminophen (TYLENOL) 325 MG tablet Take 650 mg by mouth every 6 (six) hours as needed for mild pain.    [provider]  albuterol (PROVENTIL HFA;VENTOLIN HFA) 108 (90 Base) MCG/ACT inhaler Inhale 2 puffs into the lungs every 6 (six) hours as needed for wheezing or shortness of breath. Patient not taking: Reported  on 08/02/2018 12/06/16   Lorella Nimrod, MD  amiodarone (PACERONE) 200 MG tablet Take 1 tablet (200 mg total) by mouth daily. 05/15/18   Isaiah Serge, NP  amLODipine (NORVASC) 2.5 MG tablet Take 1 tablet (2.5 mg total) by mouth daily. 05/15/18 08/13/18  Isaiah Serge, NP  atorvastatin (LIPITOR) 40 MG tablet Take 1 tablet (40 mg total) by mouth daily. Patient not taking: Reported on 08/02/2018 03/27/18 08/01/18  Isaiah Serge, NP  calcitRIOL (ROCALTROL) 0.25 MCG capsule Take 3 capsules (0.75 mcg total) by mouth every Monday, Wednesday, and Friday with hemodialysis. Patient not taking: Reported on 08/02/2018 04/23/18   Waynetta Sandy, MD  cinacalcet (SENSIPAR) 30 MG tablet Take 1 tablet (30 mg total) by mouth daily. 08/05/18   Cristal Deer, MD  Darbepoetin Alfa (ARANESP) 100 MCG/0.5ML SOSY injection Inject 0.5 mLs (100 mcg total) into the vein every Wednesday with hemodialysis. Patient not taking: Reported on 08/02/2018 04/25/18   Waynetta Sandy, MD  metoprolol succinate (TOPROL-XL) 50 MG 24 hr tablet Take 1 tablet (50 mg total) by mouth daily. Take with or immediately following a meal. Patient taking differently: Take 50 mg by mouth 2 (two) times daily. Take with or immediately following a meal. 05/15/18 08/13/18  Isaiah Serge, NP  multivitamin (RENA-VIT) TABS tablet Take 1 tablet by mouth at bedtime. Patient not taking: Reported on 08/02/2018 04/21/18   Waynetta Sandy, MD  ondansetron (ZOFRAN) 4 MG tablet Take 1 tablet (4 mg total) by mouth every 8 (eight) hours as needed for nausea or vomiting. Patient not taking: Reported on 07/14/2018 04/03/18   Thurnell Lose, MD  sevelamer carbonate (RENVELA) 800 MG tablet Take 4,800 mg by mouth 3 (three) times daily with meals.     [provider]  warfarin (COUMADIN) 5 MG tablet Take 1 tablet (5 mg total) by mouth one time only at 6 PM. 08/04/18   Cristal Deer, MD    Physical Exam: Vitals:   08/10/18 0230  08/10/18 0245 08/10/18 0300 08/10/18 0315  BP: 108/72 (!) 108/59 99/70 101/67  Pulse:   (!) 51 93  Resp: 15 18 16 17   Temp:      TempSrc:      SpO2: 100% 100% 99% 100%  Weight:      Height:        Physical Exam  Constitutional: He is oriented to person, place, and time. He appears well-developed and well-nourished. No distress.  HENT:  Head: Normocephalic.  Mouth/Throat: Oropharynx is clear and moist.  Eyes: Right eye exhibits no discharge. Left eye exhibits no discharge.  Neck: Neck supple.  Cardiovascular: Intact distal pulses.  Tachycardic  Pulmonary/Chest: Effort normal and breath sounds normal. No respiratory distress. He has no wheezes. He has no rales.  Abdominal: Soft. Bowel sounds are normal. He exhibits no distension. There is no abdominal tenderness. There is no rebound and no guarding.  Musculoskeletal:        General:  No edema.  Neurological: He is alert and oriented to person, place, and time.  Skin: Skin is warm and dry. He is not diaphoretic.     Labs on Admission: I have personally reviewed following labs and imaging studies  CBC: Recent Labs  Lab 08/03/18 0337 08/04/18 0253 08/09/18 2226  WBC 2.3* 3.0* 3.7*  NEUTROABS 0.7*  --   --   HGB 9.6* 10.9* 9.9*  HCT 31.3* 34.3* 32.9*  MCV 97.8 98.0 104.8*  PLT 102* 96* 563*   Basic Metabolic Panel: Recent Labs  Lab 08/03/18 0337 08/09/18 2226  NA 137 138  K 4.2 3.7  CL 101 105  CO2 26 17*  GLUCOSE 93 86  BUN 37* 44*  CREATININE 7.87* 9.16*  CALCIUM 7.8* 6.7*   GFR: Estimated Creatinine Clearance: 7.9 mL/min (A) (by C-G formula based on SCr of 9.16 mg/dL (H)). Liver Function Tests: Recent Labs  Lab 08/03/18 0337 08/09/18 2226  AST 22 31  ALT 12 18  ALKPHOS 72 56  BILITOT 0.5 0.8  PROT 7.6 6.4*  ALBUMIN 3.3* 3.0*   No results for input(s): LIPASE, AMYLASE in the last 168 hours. No results for input(s): AMMONIA in the last 168 hours. Coagulation Profile: Recent Labs  Lab  08/03/18 0337 08/04/18 0253  INR 1.16 1.18   Cardiac Enzymes: No results for input(s): CKTOTAL, CKMB, CKMBINDEX, TROPONINI in the last 168 hours. BNP (last 3 results) No results for input(s): PROBNP in the last 8760 hours. HbA1C: No results for input(s): HGBA1C in the last 72 hours. CBG: No results for input(s): GLUCAP in the last 168 hours. Lipid Profile: No results for input(s): CHOL, HDL, LDLCALC, TRIG, CHOLHDL, LDLDIRECT in the last 72 hours. Thyroid Function Tests: No results for input(s): TSH, T4TOTAL, FREET4, T3FREE, THYROIDAB in the last 72 hours. Anemia Panel: No results for input(s): VITAMINB12, FOLATE, FERRITIN, TIBC, IRON, RETICCTPCT in the last 72 hours. Urine analysis:    Component Value Date/Time   COLORURINE YELLOW 05/08/2010 [place] 05/08/2010 1335   LABSPEC 1.009 05/08/2010 1335   PHURINE 6.0 05/08/2010 1335   GLUCOSEU NEGATIVE 05/08/2010 1335   HGBUR NEGATIVE 05/08/2010 1335   BILIRUBINUR NEGATIVE 05/08/2010 1335   KETONESUR NEGATIVE 05/08/2010 1335   PROTEINUR 100 (A) 05/08/2010 1335   UROBILINOGEN 0.2 05/08/2010 1335   NITRITE NEGATIVE 05/08/2010 1335   LEUKOCYTESUR SMALL (A) 05/08/2010 1335    Radiological Exams on Admission: Dg Chest 2 View  Result Date: 08/09/2018 CLINICAL DATA:  Weakness and cough.  Fever. EXAM: CHEST - 2 VIEW COMPARISON:  08/01/2018, 07/12/2018, 04/15/2018 FINDINGS: The lungs are well inflated. There is mild cardiomegaly. There is no focal airspace consolidation or pulmonary edema. There is no pleural effusion or pneumothorax. IMPRESSION: Mild cardiomegaly without acute airspace disease. Electronically Signed   By: Ulyses Jarred M.D.   On: 08/09/2018 22:42   Ct Angio Chest Pe W And/or Wo Contrast  Result Date: 08/10/2018 CLINICAL DATA:  Chest pain with weakness and dyspnea EXAM: CT ANGIOGRAPHY CHEST WITH CONTRAST TECHNIQUE: Multidetector CT imaging of the chest was performed using the standard protocol during  bolus administration of intravenous contrast. Multiplanar CT image reconstructions and MIPs were obtained to evaluate the vascular anatomy. CONTRAST:  80 cc ISOVUE-370 IOPAMIDOL (ISOVUE-370) INJECTION 76% COMPARISON:  06/12/2017 CT FINDINGS: Cardiovascular: Left main, LAD and RCA coronary arteriosclerosis. Cardiomegaly without pericardial effusion or thickening is noted. Aneurysmal dilatation of the ascending thoracic aorta is noted to 4.4 cm at the level of the main  pulmonary artery. This is without significant change. Dilatation of the main pulmonary arteries identified 4 cm compatible with chronic pulmonary hypertension. Satisfactory opacification to the segmental arterial level without acute pulmonary embolus is noted. Preferential opacification of the pulmonary arteries. No definite aortic dissection. Atherosclerosis at the origins of the great vessels with conventional branch pattern. Mediastinum/Nodes: No enlarged mediastinal, hilar, or axillary lymph nodes. Thyroid gland, trachea, and esophagus demonstrate no significant findings. Lungs/Pleura: Atelectasis at the lung bases. No pneumothorax. No acute pulmonary consolidation, edema or dominant mass. Upper Abdomen: Atrophic right kidney with probable nonobstructing or renovascular calcifications. Small cortical and parapelvic cysts are noted bilaterally. The largest cyst is seen in the interpolar left kidney measuring up to 2.7 cm. The liver, included pancreas, spleen and adrenal glands are unremarkable. The gallbladder is free of stones. No bowel obstruction or inflammation. Musculoskeletal: Lower thoracic spondylosis with multilevel degenerative disc disease. No acute nor suspicious osseous lesions. Review of the MIP images confirms the above findings. IMPRESSION: 1. Ascending thoracic aortic aneurysm measuring 4.4 cm at the level of the main pulmonary artery. Recommend annual imaging followup by CTA or MRA. This recommendation follows 2010  ACCF/AHA/AATS/ACR/ASA/SCA/SCAI/SIR/STS/SVM Guidelines for the Diagnosis and Management of Patients with Thoracic Aortic Disease. 2010; 121: V956-L875. 2. No acute pulmonary embolus. 3. Dilated main pulmonary artery to 4 cm compatible with chronic pulmonary hypertension. 4. Bilateral renal cysts with atrophy of the right kidney. 5. Lower thoracic spondylosis. Aortic Atherosclerosis (ICD10-I70.0). Aortic aneurysm NOS (ICD10-I71.9). Electronically Signed   By: Ashley Royalty M.D.   On: 08/10/2018 00:48    EKG: Independently reviewed.  Atrial flutter with 2:1 AV block.  Heart rate 131.  QTc 520.  Assessment/Plan Principal Problem:   Atrial flutter with rapid ventricular response (HCC) Active Problems:   HYPERTENSION, BENIGN SYSTEMIC   End-stage renal disease on hemodialysis (HCC)   Chest pain   Vomiting and diarrhea   Atrial flutter/fibrillation with rapid ventricular response -History of paroxysmal atrial fibrillation -Heart rate in the 130s per EMS.  EKG showing atrial flutter with 2:1 AV block.  Currently in A. fib on the monitor with a rate in the 110s-120s. -Received IV Cardizem 10 mg in the ED.  Patient became hypotensive.  Blood pressure improved with IV fluid.  ED provider discussed the case with cardiology who recommended starting amiodarone bolus and infusion.  Cardiology will see the patient in the morning. -Continue amiodarone -Continue home Coumadin for anticoagulation -Gentle IV fluid -Check TSH, free T4 -Cardiac monitoring  Chest pain -Chest pain possibly related to atrial flutter/fibrillation.  EKG showing atrial flutter.  Troponin x2 negative.  CTA negative for PE. -History of mild, nonobstructive CAD per cath done in September 2019. -Cardiac monitoring -Continue to trend troponin  Vomiting, diarrhea LFTs normal.  Abdominal exam benign.  Patient reports having one episode of vomiting at the time he had chest pain and one episode of diarrhea.  No further vomiting or diarrhea  since he has been in the hospital. -Check lipase level -GI pathogen panel if patient has diarrhea in the hospital. -Zofran PRN -Continue to monitor  ESRD on HD MWF Currently does not appear volume overloaded on exam.  Potassium 3.7.  Bicarb 17. -Gentle IV fluid hydration in the setting of hypotension -Bicarb supplementation -Consult nephrology in the morning for dialysis  -Continue home renal supplements  Hypertension -Hold antihypertensives in the setting of hypotension  COPD -Stable.  No bronchospasm.  Avoid giving beta agonist at this time in the setting of tachycardia.  Aortic aneurysm CTA with evidence of ascending thoracic aortic aneurysm measuring 4.4 cm at the level of the main pulmonary artery. -Patient will need follow-up imaging by CTA or MRA.  Please ensure PCP follow-up.  DVT prophylaxis: Coumadin Code Status: Patient wishes to be full code. Family Communication: No family available. Disposition Plan: Anticipate discharge in 1 to 2 days. Consults called: Cardiology Admission status: Observation, progressive care unit   Shela Leff MD [facility] Pager [phone]  If 7PM-7AM, please contact night-coverage [email or web address] Password [facility]  08/10/2018, 3:28 AM

## 2018-08-10 NOTE — Progress Notes (Signed)
[place] TEAM 1 - Stepdown/ICU TEAM  DAMONTE FRIESON  UDJ:[id number] DOB: August 23, 1955 DOA: 08/09/2018 PCP: Scot Jun, FNP    Brief Narrative:  63 y.o. male w/ a hx of nonobstructive CAD, ESRD on HD, COPD, hypertension, hypothyroidism, and anemia who presened via EMS from a shelter for evaluation of chest pain, hypotension, and tachycardia.  His BP was found to be in the 70s, and his pulse 130.    Significant Events: 1/31 admit  Subjective: Pt is seen for a f/u visit.   Assessment & Plan:  Chronic Parox Afib with Acute RVR EKG at admit noted atrial flutter with 2:1 AV block - became hypotensive w/ cardizem use - now on amio gtt   Chest pain Troponin negative - CTa negative for PE - mild, nonobstructive CAD per cath September 2019  ESRD on HD MWF does not appear volume overloaded on exam  COPD Stable  Aortic aneurysm CTa noted ascending thoracic aortic aneurysm measuring 4.4 cm at the level of the main pulmonary artery - will need follow-up imaging by CTA or MRA as outpt  DVT prophylaxis: warfarin Code Status: FULL CODE Family Communication: no family present at time of exam  Disposition Plan:   Consultants:  none  Antimicrobials:  none   Objective: Blood pressure (!) 138/94, pulse 74, temperature 97.7 F (36.5 C), temperature source Oral, resp. rate 15, height 5\' 8"  (1.727 m), weight 70 kg, SpO2 100 %.  Intake/Output Summary (Last 24 hours) at 08/10/2018 1141 Last data filed at 08/10/2018 0858 Gross per 24 hour  Intake 931.47 ml  Output -  Net 931.47 ml   Filed Weights   08/09/18 2143  Weight: 70 kg    Examination: Pt is seen for a f/u visit.   CBC: Recent Labs  Lab 08/04/18 0253 08/09/18 2226  WBC 3.0* 3.7*  HGB 10.9* 9.9*  HCT 34.3* 32.9*  MCV 98.0 104.8*  PLT 96* 588*   Basic Metabolic Panel: Recent Labs  Lab 08/09/18 2226 08/10/18 0341  NA 138 136  K 3.7 3.7  CL 105 99  CO2 17* 21*  GLUCOSE 86 79  BUN 44* 49*  CREATININE  9.16* 10.25*  CALCIUM 6.7* 7.0*  PHOS  --  3.0   GFR: Estimated Creatinine Clearance: 7 mL/min (A) (by C-G formula based on SCr of 10.25 mg/dL (H)).  Liver Function Tests: Recent Labs  Lab 08/09/18 2226 08/10/18 0341  AST 31  --   ALT 18  --   ALKPHOS 56  --   BILITOT 0.8  --   PROT 6.4*  --   ALBUMIN 3.0* 2.9*   Recent Labs  Lab 08/10/18 0341  LIPASE 104*    Coagulation Profile: Recent Labs  Lab 08/04/18 0253 08/10/18 0341  INR 1.18 1.14    HbA1C: Hgb A1c MFr Bld  Date/Time Value Ref Range Status  07/13/2017 10:20 AM 4.6 (L) 4.8 - 5.6 % Final    Comment:             Prediabetes: 5.7 - 6.4          Diabetes: >6.4          Glycemic control for adults with diabetes: <7.0     Scheduled Meds: . calcitRIOL  0.75 mcg Oral Q M,W,F-HD  . cinacalcet  30 mg Oral Daily  . sevelamer carbonate  4,800 mg Oral TID WC  . warfarin  5 mg Oral ONCE-1800  . Warfarin - Pharmacist Dosing Inpatient   Does not  apply q1800     LOS: 0 days   Cherene Altes, MD [facility] Office  [phone] Pager - Text Page per Amion  If 7PM-7AM, please contact night-coverage per Amion 08/10/2018, 11:41 AM

## 2018-08-10 NOTE — Progress Notes (Signed)
ANTICOAGULATION CONSULT NOTE - Initial Consult  Pharmacy Consult for IV heparin Indication: chest pain/ACS and atrial fibrillation  No Known Allergies  Patient Measurements: Height: 5\' 8"  (172.7 cm) Weight: 154 lb 5.2 oz (70 kg) IBW/kg (Calculated) : 68.4 Heparin Dosing Weight: 70 kg  Vital Signs: Temp: 97.4 F (36.3 C) (01/31 1630) Temp Source: Oral (01/31 1630) BP: 146/93 (01/31 1630) Pulse Rate: 78 (01/31 1630)  Labs: Recent Labs    08/09/18 2226 08/10/18 0341 08/10/18 1135 08/10/18 1652  HGB 9.9*  --   --   --   HCT 32.9*  --   --   --   PLT 120*  --   --   --   LABPROT  --  14.5  --   --   INR  --  1.14  --   --   CREATININE 9.16* 10.25*  --   --   TROPONINI  --   --  0.05* 0.04*    Estimated Creatinine Clearance: 7 mL/min (A) (by C-G formula based on SCr of 10.25 mg/dL (H)).   Medical History: Past Medical History:  Diagnosis Date  . Anemia   . CAD (coronary artery disease)    a. 03/2018: cath showing 20 to 30% stenosis along the LAD, luminal irregularities along the RCA, and angiographically normal LCx  . COPD (chronic obstructive pulmonary disease) ([place])   . Dyspnea    "with too much fluid"  . ESRD (end stage renal disease) on dialysis [facility])    "MWF; Jeneen Rinks" (07/22/17)  . Hemodialysis patient ([place])   . Hepatitis C    Still positive s/p liver biopsy at [facility]  and interferon therapy for 6 months. Most recent lab work was on 10/24/12  . Hepatitis C    "took the tx; gone now" (12/05/2016)  Was treated  . History of blood transfusion ~ 2012/2013   "related to my kidneys; blood was low"  . Hypertension   . Substance abuse ([place])   . Thyroid disease     Medications:  Infusions:  . amiodarone 30 mg/hr (08/10/18 1012)    Assessment: 63 yo male on chronic Coumadin for afib, INR currently subtherapeutic and pharmacy asked to start IV heparin.  No overt bleeding or complications noted.  Goal of Therapy:  Heparin level 0.3-0.7 units/ml Monitor  platelets by anticoagulation protocol: Yes   Plan:  1. Start IV Heparin at rate of 1000 units/hr. 2. Check heparin level in 8 hrs 3. Daily heparin level and CBC. 4. Stop heparin once INR over 2.  Marguerite Olea, [facility] Clinical Pharmacist Phone [phone]  08/10/2018 7:22 PM

## 2018-08-11 DIAGNOSIS — E8889 Other specified metabolic disorders: Secondary | ICD-10-CM | POA: Diagnosis present

## 2018-08-11 DIAGNOSIS — I480 Paroxysmal atrial fibrillation: Secondary | ICD-10-CM | POA: Diagnosis present

## 2018-08-11 DIAGNOSIS — N2581 Secondary hyperparathyroidism of renal origin: Secondary | ICD-10-CM | POA: Diagnosis not present

## 2018-08-11 DIAGNOSIS — R509 Fever, unspecified: Secondary | ICD-10-CM | POA: Diagnosis present

## 2018-08-11 DIAGNOSIS — Z8619 Personal history of other infectious and parasitic diseases: Secondary | ICD-10-CM | POA: Diagnosis not present

## 2018-08-11 DIAGNOSIS — D631 Anemia in chronic kidney disease: Secondary | ICD-10-CM | POA: Diagnosis not present

## 2018-08-11 DIAGNOSIS — I2510 Atherosclerotic heart disease of native coronary artery without angina pectoris: Secondary | ICD-10-CM | POA: Diagnosis present

## 2018-08-11 DIAGNOSIS — N2889 Other specified disorders of kidney and ureter: Secondary | ICD-10-CM | POA: Diagnosis not present

## 2018-08-11 DIAGNOSIS — I440 Atrioventricular block, first degree: Secondary | ICD-10-CM | POA: Diagnosis present

## 2018-08-11 DIAGNOSIS — N186 End stage renal disease: Secondary | ICD-10-CM | POA: Diagnosis not present

## 2018-08-11 DIAGNOSIS — J449 Chronic obstructive pulmonary disease, unspecified: Secondary | ICD-10-CM | POA: Diagnosis present

## 2018-08-11 DIAGNOSIS — R079 Chest pain, unspecified: Secondary | ICD-10-CM | POA: Diagnosis present

## 2018-08-11 DIAGNOSIS — Z8249 Family history of ischemic heart disease and other diseases of the circulatory system: Secondary | ICD-10-CM | POA: Diagnosis not present

## 2018-08-11 DIAGNOSIS — E039 Hypothyroidism, unspecified: Secondary | ICD-10-CM | POA: Diagnosis present

## 2018-08-11 DIAGNOSIS — I120 Hypertensive chronic kidney disease with stage 5 chronic kidney disease or end stage renal disease: Secondary | ICD-10-CM | POA: Diagnosis not present

## 2018-08-11 DIAGNOSIS — F17210 Nicotine dependence, cigarettes, uncomplicated: Secondary | ICD-10-CM | POA: Diagnosis present

## 2018-08-11 DIAGNOSIS — R000 Tachycardia, unspecified: Secondary | ICD-10-CM | POA: Diagnosis present

## 2018-08-11 DIAGNOSIS — Z7901 Long term (current) use of anticoagulants: Secondary | ICD-10-CM | POA: Diagnosis not present

## 2018-08-11 DIAGNOSIS — I959 Hypotension, unspecified: Secondary | ICD-10-CM | POA: Diagnosis present

## 2018-08-11 DIAGNOSIS — I4892 Unspecified atrial flutter: Secondary | ICD-10-CM | POA: Diagnosis not present

## 2018-08-11 DIAGNOSIS — D649 Anemia, unspecified: Secondary | ICD-10-CM | POA: Diagnosis present

## 2018-08-11 DIAGNOSIS — Z833 Family history of diabetes mellitus: Secondary | ICD-10-CM | POA: Diagnosis not present

## 2018-08-11 DIAGNOSIS — Z9115 Patient's noncompliance with renal dialysis: Secondary | ICD-10-CM | POA: Diagnosis not present

## 2018-08-11 DIAGNOSIS — I712 Thoracic aortic aneurysm, without rupture: Secondary | ICD-10-CM | POA: Diagnosis present

## 2018-08-11 DIAGNOSIS — Z992 Dependence on renal dialysis: Secondary | ICD-10-CM | POA: Diagnosis not present

## 2018-08-11 LAB — COMPREHENSIVE METABOLIC PANEL
ALT: 12 U/L (ref 0–44)
AST: 18 U/L (ref 15–41)
Albumin: 2.7 g/dL — ABNORMAL LOW (ref 3.5–5.0)
Alkaline Phosphatase: 56 U/L (ref 38–126)
Anion gap: 16 — ABNORMAL HIGH (ref 5–15)
BUN: 63 mg/dL — ABNORMAL HIGH (ref 8–23)
CALCIUM: 6.7 mg/dL — AB (ref 8.9–10.3)
CO2: 20 mmol/L — ABNORMAL LOW (ref 22–32)
Chloride: 100 mmol/L (ref 98–111)
Creatinine, Ser: 12.77 mg/dL — ABNORMAL HIGH (ref 0.61–1.24)
GFR calc Af Amer: 4 mL/min — ABNORMAL LOW (ref >60)
GFR calc non Af Amer: 4 mL/min — ABNORMAL LOW (ref >60)
Glucose, Bld: 90 mg/dL (ref 70–99)
Potassium: 4.1 mmol/L (ref 3.5–5.1)
Sodium: 136 mmol/L (ref 135–145)
Total Bilirubin: 0.2 mg/dL — ABNORMAL LOW (ref 0.3–1.2)
Total Protein: 5.9 g/dL — ABNORMAL LOW (ref 6.5–8.1)

## 2018-08-11 LAB — CBC
HCT: 27.6 % — ABNORMAL LOW (ref 39.0–52.0)
HEMOGLOBIN: 8.6 g/dL — AB (ref 13.0–17.0)
MCH: 31.2 pg (ref 26.0–34.0)
MCHC: 31.2 g/dL (ref 30.0–36.0)
MCV: 100 fL (ref 80.0–100.0)
Platelets: 103 10*3/uL — ABNORMAL LOW (ref 150–400)
RBC: 2.76 MIL/uL — ABNORMAL LOW (ref 4.22–5.81)
RDW: 16.3 % — ABNORMAL HIGH (ref 11.5–15.5)
WBC: 3.1 10*3/uL — ABNORMAL LOW (ref 4.0–10.5)
nRBC: 0 % (ref 0.0–0.2)

## 2018-08-11 LAB — HEPARIN LEVEL (UNFRACTIONATED)
Heparin Unfractionated: 0.15 IU/mL — ABNORMAL LOW (ref 0.30–0.70)
Heparin Unfractionated: 0.66 IU/mL (ref 0.30–0.70)
Heparin Unfractionated: 0.55 IU/mL (ref 0.30–0.70)

## 2018-08-11 LAB — PROTIME-INR
INR: 1.28
Prothrombin Time: 15.8 seconds — ABNORMAL HIGH (ref 11.4–15.2)

## 2018-08-11 LAB — MAGNESIUM: Magnesium: 2.1 mg/dL (ref 1.7–2.4)

## 2018-08-11 MED ORDER — AMIODARONE HCL 200 MG PO TABS
200.0000 mg | ORAL_TABLET | Freq: Two times a day (BID) | ORAL | Status: DC
  Administered 2018-08-12 – 2018-08-13 (×3): 200 mg via ORAL
  Filled 2018-08-11 (×4): qty 1

## 2018-08-11 MED ORDER — WARFARIN SODIUM 5 MG PO TABS
5.0000 mg | ORAL_TABLET | Freq: Every day | ORAL | Status: DC
  Administered 2018-08-11 – 2018-08-12 (×2): 5 mg via ORAL
  Filled 2018-08-11 (×3): qty 1

## 2018-08-11 MED ORDER — TRAZODONE HCL 50 MG PO TABS
50.0000 mg | ORAL_TABLET | Freq: Once | ORAL | Status: AC
  Administered 2018-08-11: 50 mg via ORAL
  Filled 2018-08-11: qty 1

## 2018-08-11 MED ORDER — AMIODARONE HCL 200 MG PO TABS: 200.0000 mg | ORAL_TABLET | Freq: Every day | ORAL | Status: DC

## 2018-08-11 MED ORDER — BUTALBITAL-APAP-CAFFEINE 50-325-40 MG PO TABS: 1.0000 | ORAL_TABLET | Freq: Four times a day (QID) | ORAL | Status: DC | PRN

## 2018-08-11 MED ORDER — DARBEPOETIN ALFA 100 MCG/0.5ML IJ SOSY: 100.0000 ug | PREFILLED_SYRINGE | INTRAMUSCULAR | Status: DC

## 2018-08-11 NOTE — Progress Notes (Signed)
Raymond [name] - Stepdown/ICU TEAM  Raymond [name]  FBP:[id number] DOB: [birth year] DOA: [name]/30/2020 PCP: Scot Jun, FNP    Brief Narrative:  62 y.o. male w/ a hx of nonobstructive CAD, ESRD on HD, COPD, HTN, hypothyroidism, and anemia who presened via EMS from a shelter for evaluation of chest pain, hypotension, and tachycardia.  His BP was found to be in the 70s, and his pulse 130.    Significant Events: [name]/31 admit  Subjective: Resting comfortably in bed. C/o ongoing HA. Denies cp, n/v, or abdom pain.   Assessment & Plan:  Chronic Parox Afib with Acute RVR EKG at admit noted atrial flutter with 2:[name] AV block - became hypotensive w/ cardizem use therefore was transitioned to amio gtt - has converted to NSR this morning - transition to oral amio - cont warfarin per Pharmacy w/ heparin overlap - pt will need ot be counseled again on the absolute need for compliance w/ med dosing   Chest pain Troponin negative - CTa negative for PE - mild, nonobstructive CAD per cath September 2019 - resolved   ESRD on HD MWF does not appear volume overloaded on exam - HD per Nephrology   COPD Stable  Aortic aneurysm CTa noted ascending thoracic aortic aneurysm measuring 4.4 cm at the level of the main pulmonary artery - will need follow-up imaging by CTA or MRA as outpt  DVT prophylaxis: warfarin Code Status: FULL CODE Family Communication: no family present at time of exam  Disposition Plan:   Consultants:  none  Antimicrobials:  none   Objective: Blood pressure 133/77, pulse 79, temperature 99.2 F (37.3 C), temperature source Oral, resp. rate 18, height 5\' 8"  ([name].727 m), weight 73.7 kg, SpO2 100 %.  Intake/Output Summary (Last 24 hours) at 2/[name]/2020 1506 Last data filed at 2/[name]/2020 1154 Gross per 24 hour  Intake 381.95 ml  Output 500 ml  Net -118.05 ml   Filed Weights   08/11/18 0520 08/11/18 0740 08/11/18 1154  Weight: 74.6 kg 74.2 kg 73.7 kg     Examination: General: No acute respiratory distress Lungs: Clear to auscultation bilaterally without wheezes or crackles Cardiovascular: Regular rate and rhythm without murmur gallop or rub normal S1 and S2 Abdomen: Nontender, nondistended, soft, bowel sounds positive, no rebound, no ascites, no appreciable mass Extremities: trace B LE edema    CBC: Recent Labs  Lab 08/09/18 2226 08/11/18 0459  WBC 3.7* 3.[name]*  HGB 9.9* 8.6*  HCT 32.9* 27.6*  MCV 104.8* 100.0  PLT 120* 824*   Basic Metabolic Panel: Recent Labs  Lab 08/09/18 2226 08/10/18 0341 08/11/18 0459  NA 138 136 136  K 3.7 3.7 4.[name]  CL 105 99 100  CO2 17* 21* 20*  GLUCOSE 86 79 90  BUN 44* 49* 63*  CREATININE 9.16* 10.25* 12.77*  CALCIUM 6.7* 7.0* 6.7*  MG  --   --  2.[name]  PHOS  --  3.0  --    GFR: Estimated Creatinine Clearance: 5.7 mL/min (A) (by C-G formula based on SCr of 12.77 mg/dL (H)).  Liver Function Tests: Recent Labs  Lab 08/09/18 2226 08/10/18 0341 08/11/18 0459  AST 31  --  18  ALT 18  --  12  ALKPHOS 56  --  56  BILITOT 0.8  --  0.2*  PROT 6.4*  --  5.9*  ALBUMIN 3.0* 2.9* 2.7*   Recent Labs  Lab 08/10/18 0341  LIPASE 104*    Coagulation Profile: Recent Labs  Lab  08/10/18 0341 08/11/18 0459  INR [name].14 [name].28    HbA1C: Hgb A1c MFr Bld  Date/Time Value Ref Range Status  07/13/2017 10:20 AM 4.6 (L) 4.8 - 5.6 % Final    Comment:             Prediabetes: 5.7 - 6.4          Diabetes: >6.4          Glycemic control for adults with diabetes: <7.0     Scheduled Meds: . calcitRIOL  0.75 mcg Oral Q M,W,F-HD  . Chlorhexidine Gluconate Cloth  6 each Topical Q0600  . [START ON 08/14/2018] darbepoetin (ARANESP) injection - DIALYSIS  100 mcg Intravenous Q Tue-HD  . sevelamer carbonate  4,800 mg Oral TID WC  . warfarin  5 mg Oral q1800  . Warfarin - Pharmacist Dosing Inpatient   Does not apply q1800     LOS: 0 days   Cherene Altes, MD [facility] Office   [phone] Pager - Text Page per Amion  If 7PM-7AM, please contact night-coverage per Amion 2/[name]/2020, 3:06 PM

## 2018-08-11 NOTE — Progress Notes (Signed)
ANTICOAGULATION CONSULT NOTE   Pharmacy Consult for Heparin  Indication: atrial fibrillation  No Known Allergies  Patient Measurements: Height: 5\' 8"  (172.7 cm) Weight: 162 lb 7.7 oz (73.7 kg) IBW/kg (Calculated) : 68.4  Vital Signs: Temp: 99.3 F (37.4 C) (02/01 2105) Temp Source: Oral (02/01 2105) BP: 110/40 (02/01 2105) Pulse Rate: 84 (02/01 2105)  Labs: Recent Labs    08/09/18 2226 08/10/18 0341 08/10/18 1135 08/10/18 1652 08/11/18 0459 08/11/18 1326 08/11/18 2126  HGB 9.9*  --   --   --  8.6*  --   --   HCT 32.9*  --   --   --  27.6*  --   --   PLT 120*  --   --   --  103*  --   --   LABPROT  --  14.5  --   --  15.8*  --   --   INR  --  1.14  --   --  1.28  --   --   HEPARINUNFRC  --   --   --   --  0.15* 0.66 0.55  CREATININE 9.16* 10.25*  --   --  12.77*  --   --   TROPONINI  --   --  0.05* 0.04*  --   --   --     Estimated Creatinine Clearance: 5.7 mL/min (A) (by C-G formula based on SCr of 12.77 mg/dL (H)).   Assessment: 63 y.o. male admitted with h/o Afib. Was on warfarin previously (last discharged on 1/23 on warfarin 5 mg daily).   INR today is 1.28, after restarting 5 mg last night. Heparin level this afternoon came back therapeutic at 0.66, on 1350 units/hr. Hgb 8.6, plt 103. No s/sx of bleeding. No infusion issues. On concurrent IV amiodarone.   2/1 PM update: heparin level therapeutic x 2  Goal of Therapy:  Heparin level: 0.3-0.7  INR 2-3 Monitor platelets by anticoagulation protocol: Yes   Plan:  Continue Heparin at 1350 units/hr Monitor daily HL, CBC, and for s/sx of bleeding  Narda Bonds, PharmD, BCPS Clinical Pharmacist Phone: [phone]

## 2018-08-11 NOTE — Progress Notes (Signed)
[place] for Coumadin/Heparin Indication: atrial fibrillation  No Known Allergies  Patient Measurements: Height: 5\' 8"  (172.7 cm) Weight: 162 lb 7.7 oz (73.7 kg) IBW/kg (Calculated) : 68.4  Vital Signs: Temp: 99.2 F (37.3 C) (02/01 1310) Temp Source: Oral (02/01 1310) BP: 133/77 (02/01 1310) Pulse Rate: 79 (02/01 1310)  Labs: Recent Labs    08/09/18 2226 08/10/18 0341 08/10/18 1135 08/10/18 1652 08/11/18 0459 08/11/18 1326  HGB 9.9*  --   --   --  8.6*  --   HCT 32.9*  --   --   --  27.6*  --   PLT 120*  --   --   --  103*  --   LABPROT  --  14.5  --   --  15.8*  --   INR  --  1.14  --   --  1.28  --   HEPARINUNFRC  --   --   --   --  0.15* 0.66  CREATININE 9.16* 10.25*  --   --  12.77*  --   TROPONINI  --   --  0.05* 0.04*  --   --     Estimated Creatinine Clearance: 5.7 mL/min (A) (by C-G formula based on SCr of 12.77 mg/dL (H)).   Assessment: 63 y.o. male admitted with h/o Afib. Was on warfarin previously (last discharged on 1/23 on warfarin 5 mg daily).   INR today is 1.28, after restarting 5 mg last night. Heparin level this afternoon came back therapeutic at 0.66, on 1350 units/hr. Hgb 8.6, plt 103. No s/sx of bleeding. No infusion issues. On concurrent IV amiodarone.   Goal of Therapy:  Heparin level: 0.3-0.7  INR 2-3 Monitor platelets by anticoagulation protocol: Yes   Plan:  Continue Heparin at 1350 units/hr Check heparin level in 8 hours.  Coumadin 5 mg daily Monitor daily HL, CBC, and for s/sx of bleeding  Antonietta Jewel, PharmD, [place] Clinical Pharmacist  Pager: [phone] Phone: [phone] 08/11/2018,2:22 PM

## 2018-08-11 NOTE — Progress Notes (Signed)
[place] for Coumadin/Heparin Indication: atrial fibrillation  No Known Allergies  Patient Measurements: Height: 5\' 8"  (172.7 cm) Weight: 164 lb 8 oz (74.6 kg) IBW/kg (Calculated) : 68.4  Vital Signs: Temp: 98.4 F (36.9 C) (02/01 0520) Temp Source: Oral (02/01 0520) BP: 138/73 (02/01 0520) Pulse Rate: 78 (02/01 0520)  Labs: Recent Labs    08/09/18 2226 08/10/18 0341 08/10/18 1135 08/10/18 1652 08/11/18 0459  HGB 9.9*  --   --   --   --   HCT 32.9*  --   --   --   --   PLT 120*  --   --   --   --   LABPROT  --  14.5  --   --  15.8*  INR  --  1.14  --   --  1.28  HEPARINUNFRC  --   --   --   --  0.15*  CREATININE 9.16* 10.25*  --   --   --   TROPONINI  --   --  0.05* 0.04*  --     Estimated Creatinine Clearance: 7 mL/min (A) (by C-G formula based on SCr of 10.25 mg/dL (H)).   Assessment: 63 y.o. male admitted with h/o Afib  For anticoagulation  Goal of Therapy:  INR 2-3 Monitor platelets by anticoagulation protocol: Yes   Plan:  Increase Heparin 1350 units/hr Check heparin level in 8 hours.  Coumadin 5 mg daily  Caryl Pina 08/11/2018,6:31 AM

## 2018-08-11 NOTE — Procedures (Signed)
I was present at this dialysis session. I have reviewed the session itself and made appropriate changes.   UF goal 0.5L, 2Kbath. Tolerating HD well.    Filed Weights   08/09/18 2143 08/11/18 0520 08/11/18 0740  Weight: 70 kg 74.6 kg 74.2 kg    Recent Labs  Lab 08/10/18 0341 08/11/18 0459  NA 136 136  K 3.7 4.1  CL 99 100  CO2 21* 20*  GLUCOSE 79 90  BUN 49* 63*  CREATININE 10.25* 12.77*  CALCIUM 7.0* 6.7*  PHOS 3.0  --     Recent Labs  Lab 08/09/18 2226 08/11/18 0459  WBC 3.7* 3.1*  HGB 9.9* 8.6*  HCT 32.9* 27.6*  MCV 104.8* 100.0  PLT 120* 103*    Scheduled Meds: . calcitRIOL  0.75 mcg Oral Q M,W,F-HD  . Chlorhexidine Gluconate Cloth  6 each Topical Q0600  . darbepoetin (ARANESP) injection - DIALYSIS  100 mcg Intravenous Q Fri-HD  . sevelamer carbonate  4,800 mg Oral TID WC  . warfarin  5 mg Oral q1800  . Warfarin - Pharmacist Dosing Inpatient   Does not apply q1800   Continuous Infusions: . amiodarone 30 mg/hr (08/10/18 2234)  . heparin 1,350 Units/hr (08/11/18 0639)   PRN Meds:.acetaminophen, ondansetron (ZOFRAN) IV   Pearson Grippe  MD 08/11/2018, 8:49 AM

## 2018-08-12 LAB — HEPARIN LEVEL (UNFRACTIONATED): Heparin Unfractionated: 0.61 IU/mL (ref 0.30–0.70)

## 2018-08-12 LAB — CBC
HCT: 27.2 % — ABNORMAL LOW (ref 39.0–52.0)
Hemoglobin: 8.6 g/dL — ABNORMAL LOW (ref 13.0–17.0)
MCH: 31.3 pg (ref 26.0–34.0)
MCHC: 31.6 g/dL (ref 30.0–36.0)
MCV: 98.9 fL (ref 80.0–100.0)
Platelets: 107 10*3/uL — ABNORMAL LOW (ref 150–400)
RBC: 2.75 MIL/uL — ABNORMAL LOW (ref 4.22–5.81)
RDW: 16.3 % — ABNORMAL HIGH (ref 11.5–15.5)
WBC: 2.7 10*3/uL — ABNORMAL LOW (ref 4.0–10.5)
nRBC: 0 % (ref 0.0–0.2)

## 2018-08-12 LAB — RENAL FUNCTION PANEL
Albumin: 2.6 g/dL — ABNORMAL LOW (ref 3.5–5.0)
Anion gap: 12 (ref 5–15)
BUN: 25 mg/dL — ABNORMAL HIGH (ref 8–23)
CHLORIDE: 99 mmol/L (ref 98–111)
CO2: 25 mmol/L (ref 22–32)
Calcium: 7.2 mg/dL — ABNORMAL LOW (ref 8.9–10.3)
Creatinine, Ser: 8.11 mg/dL — ABNORMAL HIGH (ref 0.61–1.24)
GFR calc Af Amer: 7 mL/min — ABNORMAL LOW (ref >60)
GFR calc non Af Amer: 6 mL/min — ABNORMAL LOW (ref >60)
Glucose, Bld: 84 mg/dL (ref 70–99)
POTASSIUM: 3.6 mmol/L (ref 3.5–5.1)
Phosphorus: 2.3 mg/dL — ABNORMAL LOW (ref 2.5–4.6)
Sodium: 136 mmol/L (ref 135–145)

## 2018-08-12 LAB — PROTIME-INR
INR: 1.29
Prothrombin Time: 16 seconds — ABNORMAL HIGH (ref 11.4–15.2)

## 2018-08-12 MED ORDER — SEVELAMER CARBONATE 800 MG PO TABS
3200.0000 mg | ORAL_TABLET | Freq: Three times a day (TID) | ORAL | Status: DC
  Administered 2018-08-12 – 2018-08-13 (×2): 3200 mg via ORAL
  Filled 2018-08-12 (×2): qty 4

## 2018-08-12 NOTE — Progress Notes (Signed)
Raymond [name]  FXT:[id number] DOB: 08-Feb-1956 DOA: 08/09/2018 PCP: Scot Jun, FNP    Brief Narrative:  63 y.o. male w/ a hx of nonobstructive CAD, ESRD on HD, COPD, HTN, hypothyroidism, and anemia who presened via EMS from a shelter for evaluation of chest pain, hypotension, and tachycardia.  His BP was found to be in the 70s, and his pulse 130.    Significant Events: 1/31 admit  Subjective: Pt developed a fever to 101.2 early this morning. He denies complaints at this time. His HA is resolved. He is asking when he can be d/c. He denies cp, sob, or abdom pain.   Assessment & Plan:  Chronic Parox Afib with Acute RVR EKG at admit noted atrial flutter with 2:1 AV block - became hypotensive w/ cardizem use therefore was transitioned to amio gtt - converted to NSR 2/1 - transitioned to oral amio - cont warfarin per Pharmacy w/ heparin overlap - pt will need to be counseled again on the absolute need for compliance w/ med dosing - does not have to be therapeutic before d/c   Transient FUO No clear etiology - does not appear sick - follow over night   Chest pain Troponin negative - CTa negative for PE - mild, nonobstructive CAD per cath September 2019 - sx resolved   ESRD on HD T/Th/Sat does not appear volume overloaded on exam - HD per Nephrology   COPD Stable  Aortic aneurysm CTa noted ascending thoracic aortic aneurysm measuring 4.4 cm at the level of the main pulmonary artery - will need follow-up imaging by CTA or MRA as outpt  DVT prophylaxis: warfarin Code Status: FULL CODE Family Communication:   Disposition Plan: possible d/c in AM if no further fevers   Consultants:  none  Antimicrobials:  none   Objective: Blood pressure (!) 159/96, pulse 86, temperature 99.5 F (37.5 C), temperature source Oral, resp. rate 17, height 5\' 8"  (1.727 m), weight 75.3 kg, SpO2 100 %.  Intake/Output Summary (Last 24 hours) at 08/12/2018  1621 Last data filed at 08/12/2018 2992 Gross per 24 hour  Intake 450.6 ml  Output -  Net 450.6 ml   Filed Weights   08/11/18 0740 08/11/18 1154 08/12/18 0401  Weight: 74.2 kg 73.7 kg 75.3 kg    Examination: General: No acute respiratory distress - A&O  Lungs: Clear to auscultation B - no wheezing  Cardiovascular: RRR - no M or rub  Abdomen: NT/ND, soft, bs+, no mass  Extremities: trace B LE edema    CBC: Recent Labs  Lab 08/09/18 2226 08/11/18 0459 08/12/18 0358  WBC 3.7* 3.1* 2.7*  HGB 9.9* 8.6* 8.6*  HCT 32.9* 27.6* 27.2*  MCV 104.8* 100.0 98.9  PLT 120* 103* 426*   Basic Metabolic Panel: Recent Labs  Lab 08/10/18 0341 08/11/18 0459 08/12/18 0358  NA 136 136 136  K 3.7 4.1 3.6  CL 99 100 99  CO2 21* 20* 25  GLUCOSE 79 90 84  BUN 49* 63* 25*  CREATININE 10.25* 12.77* 8.11*  CALCIUM 7.0* 6.7* 7.2*  MG  --  2.1  --   PHOS 3.0  --  2.3*   GFR: Estimated Creatinine Clearance: 8.9 mL/min (A) (by C-G formula based on SCr of 8.11 mg/dL (H)).  Liver Function Tests: Recent Labs  Lab 08/09/18 2226 08/10/18 0341 08/11/18 0459 08/12/18 0358  AST 31  --  18  --   ALT 18  --  12  --   ALKPHOS 56  --  56  --   BILITOT 0.8  --  0.2*  --   PROT 6.4*  --  5.9*  --   ALBUMIN 3.0* 2.9* 2.7* 2.6*   Recent Labs  Lab 08/10/18 0341  LIPASE 104*    Coagulation Profile: Recent Labs  Lab 08/10/18 0341 08/11/18 0459 08/12/18 0358  INR 1.14 1.28 1.29    HbA1C: Hgb A1c MFr Bld  Date/Time Value Ref Range Status  07/13/2017 10:20 AM 4.6 (L) 4.8 - 5.6 % Final    Comment:             Prediabetes: 5.7 - 6.4          Diabetes: >6.4          Glycemic control for adults with diabetes: <7.0     Scheduled Meds: . amiodarone  200 mg Oral Q12H   Followed by  . [START ON 08/19/2018] amiodarone  200 mg Oral Daily  . calcitRIOL  0.75 mcg Oral Q M,W,F-HD  . Chlorhexidine Gluconate Cloth  6 each Topical Q0600  . [START ON 08/14/2018] darbepoetin (ARANESP) injection -  DIALYSIS  100 mcg Intravenous Q Tue-HD  . sevelamer carbonate  3,200 mg Oral TID WC  . warfarin  5 mg Oral q1800  . Warfarin - Pharmacist Dosing Inpatient   Does not apply q1800     LOS: 1 day   Cherene Altes, MD [facility] Office  [phone] Pager - Text Page per Amion  If 7PM-7AM, please contact night-coverage per Amion 08/12/2018, 4:21 PM

## 2018-08-12 NOTE — Progress Notes (Signed)
[place] for Coumadin/Heparin Indication: atrial fibrillation  No Known Allergies  Patient Measurements: Height: 5\' 8"  (172.7 cm) Weight: 166 lb (75.3 kg) IBW/kg (Calculated) : 68.4  Vital Signs: Temp: 101.2 F (38.4 C) (02/02 0401) Temp Source: Oral (02/02 0401) BP: 154/66 (02/02 0401) Pulse Rate: 87 (02/02 0401)  Labs: Recent Labs    08/09/18 2226 08/10/18 0341 08/10/18 1135 08/10/18 1652  08/11/18 0459 08/11/18 1326 08/11/18 2126 08/12/18 0358  HGB 9.9*  --   --   --   --  8.6*  --   --  8.6*  HCT 32.9*  --   --   --   --  27.6*  --   --  27.2*  PLT 120*  --   --   --   --  103*  --   --  107*  LABPROT  --  14.5  --   --   --  15.8*  --   --  16.0*  INR  --  1.14  --   --   --  1.28  --   --  1.29  HEPARINUNFRC  --   --   --   --    < > 0.15* 0.66 0.55 0.61  CREATININE 9.16* 10.25*  --   --   --  12.77*  --   --  8.11*  TROPONINI  --   --  0.05* 0.04*  --   --   --   --   --    < > = values in this interval not displayed.    Estimated Creatinine Clearance: 8.9 mL/min (A) (by C-G formula based on SCr of 8.11 mg/dL (H)).   Assessment: 63 y.o. male admitted with h/o Afib. Was on warfarin previously (last discharged on 1/23 on warfarin 5 mg daily).   INR today is 1.29, after restarting 5 mg on 1/31. Heparin level this afternoon came back therapeutic at 0.61, on 1350 units/hr. Hgb 8.6, plt 107. No s/sx of bleeding. No infusion issues. On concurrent IV amiodarone.   Goal of Therapy:  Heparin level: 0.3-0.7  INR 2-3 Monitor platelets by anticoagulation protocol: Yes   Plan:  Continue Heparin at 1350 units/hr Check heparin level in 8 hours.  Coumadin 5 mg daily Monitor daily HL, CBC, and for s/sx of bleeding  Antonietta Jewel, PharmD, [place] Clinical Pharmacist  Pager: [phone] Phone: [phone] 08/12/2018,10:28 AM

## 2018-08-12 NOTE — Progress Notes (Addendum)
[facility] Progress Note   Subjective:  Seen in room. No specific complaints, no CP or dyspnea -- looks like febrile today, pt denies feeling poorly.   Objective Vitals:   08/11/18 1154 08/11/18 1310 08/11/18 2105 08/12/18 0401  BP: 131/82 133/77 (!) 110/40 (!) 154/66  Pulse: 83 79 84 87  Resp: 18  20 17   Temp: 98.2 F (36.8 C) 99.2 F (37.3 C) 99.3 F (37.4 C) (!) 101.2 F (38.4 C)  TempSrc: Oral Oral Oral Oral  SpO2: 99% 100% 95% 100%  Weight: 73.7 kg   75.3 kg  Height:       Physical Exam General: Well appearing man, NAD Heart: RRR; no murmur Lungs: CTAB Abdomen: soft, non-tender Extremities: No LE edema Dialysis Access:  AVF + thrill  Additional Objective Labs: Basic Metabolic Panel: Recent Labs  Lab 08/10/18 0341 08/11/18 0459 08/12/18 0358  NA 136 136 136  K 3.7 4.1 3.6  CL 99 100 99  CO2 21* 20* 25  GLUCOSE 79 90 84  BUN 49* 63* 25*  CREATININE 10.25* 12.77* 8.11*  CALCIUM 7.0* 6.7* 7.2*  PHOS 3.0  --  2.3*   Liver Function Tests: Recent Labs  Lab 08/09/18 2226 08/10/18 0341 08/11/18 0459 08/12/18 0358  AST 31  --  18  --   ALT 18  --  12  --   ALKPHOS 56  --  56  --   BILITOT 0.8  --  0.2*  --   PROT 6.4*  --  5.9*  --   ALBUMIN 3.0* 2.9* 2.7* 2.6*   Recent Labs  Lab 08/10/18 0341  LIPASE 104*   CBC: Recent Labs  Lab 08/09/18 2226 08/11/18 0459 08/12/18 0358  WBC 3.7* 3.1* 2.7*  HGB 9.9* 8.6* 8.6*  HCT 32.9* 27.6* 27.2*  MCV 104.8* 100.0 98.9  PLT 120* 103* 107*   Cardiac Enzymes: Recent Labs  Lab 08/10/18 1135 08/10/18 1652  TROPONINI 0.05* 0.04*   Medications: . heparin 1,350 Units/hr (08/11/18 0639)   . amiodarone  200 mg Oral Q12H   Followed by  . [START ON 08/19/2018] amiodarone  200 mg Oral Daily  . calcitRIOL  0.75 mcg Oral Q M,W,F-HD  . Chlorhexidine Gluconate Cloth  6 each Topical Q0600  . [START ON 08/14/2018] darbepoetin (ARANESP) injection - DIALYSIS  100 mcg Intravenous Q Tue-HD  .  sevelamer carbonate  4,800 mg Oral TID WC  . warfarin  5 mg Oral q1800  . Warfarin - Pharmacist Dosing Inpatient   Does not apply q1800    Dialysis Orders: TTS (just changed from MWF) at [facility] 4hr, 450/800, EDW 72kg (just raised from 71.5), 2K/2.5Ca, UF profile 4, AVF, heparin 2000 - Parsabiv 44mcg IV q HD - Mircera 135mcg IV q 2 weeks (ordered, not yet given) - Calcitriol 1.28mcg PO q HD  Assessment/Plan: 1.  A-flutter with RVR: Now controlled, back in NSR. Transitioned to PO amiodarone, on heparin drip while awaiting warfarin to be therapeutic. 2.  ESRD: Usual TTS schedule, next HD 2/4. 3.  Hypertension/volume: BP good, no edema.  Anemia: Hgb 8.6, Aranesp ordered but not given - resched to start next HD. 4.  Metabolic bone disease: Corr Ca lowish side/Phos ok. Sensipar given this AM, will hold. Phos low as well, reduce Renvela 4 -> 3/meals. 5.  CAD 6. Fever (08/12/18): No specific complaints, will defer to primary team to decide to monitor v. work-up.  Veneta Penton, PA-C 08/12/2018, 9:17 AM  [facility] Pager: [phone]   

## 2018-08-13 LAB — CBC
HCT: 29.5 % — ABNORMAL LOW (ref 39.0–52.0)
Hemoglobin: 9.1 g/dL — ABNORMAL LOW (ref 13.0–17.0)
MCH: 30.7 pg (ref 26.0–34.0)
MCHC: 30.8 g/dL (ref 30.0–36.0)
MCV: 99.7 fL (ref 80.0–100.0)
Platelets: 99 10*3/uL — ABNORMAL LOW (ref 150–400)
RBC: 2.96 MIL/uL — ABNORMAL LOW (ref 4.22–5.81)
RDW: 15.9 % — ABNORMAL HIGH (ref 11.5–15.5)
WBC: 3 10*3/uL — AB (ref 4.0–10.5)
nRBC: 0 % (ref 0.0–0.2)

## 2018-08-13 LAB — HEPARIN LEVEL (UNFRACTIONATED): Heparin Unfractionated: 0.74 IU/mL — ABNORMAL HIGH (ref 0.30–0.70)

## 2018-08-13 LAB — PROTIME-INR
INR: 1.55
Prothrombin Time: 18.4 seconds — ABNORMAL HIGH (ref 11.4–15.2)

## 2018-08-13 MED ORDER — "THROMBI-PAD 3""X3"" EX PADS": 1.0000 | MEDICATED_PAD | Freq: Once | CUTANEOUS | Status: DC

## 2018-08-13 MED ORDER — "THROMBI-PAD 3""X3"" EX PADS"
1.0000 | MEDICATED_PAD | Freq: Once | CUTANEOUS | Status: AC
  Administered 2018-08-13: 1 via TOPICAL
  Filled 2018-08-13: qty 1

## 2018-08-13 MED ORDER — SEVELAMER CARBONATE 800 MG PO TABS
800.0000 mg | ORAL_TABLET | Freq: Three times a day (TID) | ORAL | Status: DC
  Administered 2018-08-13: 800 mg via ORAL
  Filled 2018-08-13: qty 1

## 2018-08-13 MED ORDER — AMIODARONE HCL 200 MG PO TABS: 200.0000 mg | ORAL_TABLET | Freq: Every day | ORAL | 0 refills | Status: DC

## 2018-08-13 MED ORDER — WARFARIN SODIUM 5 MG PO TABS: 5.0000 mg | ORAL_TABLET | Freq: Every day | ORAL | 0 refills | Status: DC

## 2018-08-13 NOTE — Progress Notes (Addendum)
ANTICOAGULATION CONSULT NOTE  Pharmacy Consult:  Coumadin Indication: atrial fibrillation  No Known Allergies  Patient Measurements: Height: 5\' 8"  (172.7 cm) Weight: 165 lb 3.2 oz (74.9 kg) IBW/kg (Calculated) : 68.4  Vital Signs: Temp: 99.2 F (37.3 C) (02/03 0422) Temp Source: Oral (02/03 0422) BP: 118/80 (02/03 0422) Pulse Rate: 86 (02/03 0422)  Labs: Recent Labs    08/10/18 1652  08/11/18 0459  08/11/18 2126 08/12/18 0358 08/13/18 0411  HGB  --    < > 8.6*  --   --  8.6* 9.1*  HCT  --   --  27.6*  --   --  27.2* 29.5*  PLT  --   --  103*  --   --  107* 99*  LABPROT  --   --  15.8*  --   --  16.0* 18.4*  INR  --   --  1.28  --   --  1.29 1.55  HEPARINUNFRC  --   --  0.15*   < > 0.55 0.61 0.74*  CREATININE  --   --  12.77*  --   --  8.11*  --   TROPONINI 0.04*  --   --   --   --   --   --    < > = values in this interval not displayed.    Estimated Creatinine Clearance: 8.9 mL/min (A) (by C-G formula based on SCr of 8.11 mg/dL (H)).   Assessment: 73 YOM with history of Afib on Coumadin PTA.  Heparin added due to sub-therapeutic INR, but has been off overnight due to bleeding from fistula site.  Per RN, bleeding has resolved.  Awaiting MD's decision regarding anticoagulation plans (restart heparin vs hold Coumadin or continue Coumadin monotherapy).  INR sub-therapeutic but trending up.   Goal of Therapy:  Heparin level: 0.3-0.7 units/mL INR 2-3 Monitor platelets by anticoagulation protocol: Yes   Plan:  Coumadin 5mg  PO daily as ordered.  MD to D/C Coumadin if he sees fit. Daily PT / INR and CBC F/U with restarting IV heparin - please re-enter consult when ready to resume   Eureka Valdes D. Mina Marble, PharmD, BCPS, [place] 08/13/2018, 2:39 PM

## 2018-08-13 NOTE — Discharge Summary (Signed)
DISCHARGE SUMMARY  Raymond [name]  MR#: [id number]  DOB:[birth year]  Date of Admission: 08/09/2018 Date of Discharge: 08/13/2018  Attending Physician:Jeffrey Hennie Duos, MD  Patient's PCP: [name] - Dr. [name]  Consults: Nephrology   Disposition: D/C home   Follow-up Appts: Follow-up Information    Tsosie Billing, MD Follow up on 08/15/2018.   Specialty:  Internal Medicine Contact information: [place] [address] [phone]           Tests Needing Follow-up: -he will need to have his INR checked at HD on 08/14/18 -he will need to be instructed on his warfarin dosing if changes are indicated when he sees his new PCP on 08/15/18 -routine annual f/u of a stable ascending thoracic aneurysm is suggested   Discharge Diagnoses: Chronic Parox Afib with Acute RVR Transient FUO Chest pain ESRD on HD T/Th/Sat COPD Aortic aneurysm  Initial presentation: 63 y.o.malew/ a hx of nonobstructive CAD, ESRD on HD, COPD, HTN, hypothyroidism, and anemia who presened via EMS from a shelter for evaluation of chest pain, hypotension, and tachycardia. His BP was found to be in the 70s, and his pulse 130.  Hospital Course:  Chronic Parox Afib with Acute RVR EKG at admit noted atrial flutter with 2:1 AV block - became hypotensive w/ cardizem use therefore was transitioned to amio gtt - converted to NSR 2/1 - transitioned to oral amio - continued warfarin per Pharmacy w/ heparin overlap until early AM 2/3 when he began spontaneously bleeding from his HD fistula - hepar was then stopped, and his bleeding ceased - pt was counseled on the absolute need for compliance w/ med dosing - pt assured MD that he has already made plans to f/u at the [facility] in [place], and that he had already called and was told all he had to do was to walk in to establish care - his INR can be drawn at HD, but his coumadin dosing will need to be directed by his PCP - the  need to monitor his coumadin dosing closely was explained to the pt, including the risk of stroke w/ "low coumadin levels" as well as the risk for bleeding with "high coumadin levels" - the need to take precautions in the event of even seemingly mild traumatic injuries (falling down - hitting his head - etc.) with a low threshold to "go to the ED" was also discussed    Transient FUO No clear etiology - proved to be a single isolated event w/ no other signs/symptoms   Chest pain Troponin negative - CTa negative for PE - mild, nonobstructive CAD per cath September 2019 - sx resolved   ESRD on HD T/Th/Sat does not appear volume overloaded on exam - HD per Nephrology to resume usual T/Th/Sat schedule - was seen by Nephrology during this admit   COPD Stable  Aortic aneurysm CTa noted ascending thoracic aortic aneurysm measuring 4.4 cm at the level of the main pulmonary artery, which was felt to be unchanged since a CT Dec 2018 - will need follow-up annual imaging by CTA or MRA as outpt  Allergies as of 08/13/2018   No Known Allergies     Medication List    STOP taking these medications   amLODipine 2.5 MG tablet Commonly known as:  NORVASC   cinacalcet 30 MG tablet Commonly known as:  SENSIPAR   metoprolol succinate 50 MG 24 hr tablet Commonly known as:  TOPROL-XL     TAKE these  medications   acetaminophen 325 MG tablet Commonly known as:  TYLENOL Take 650 mg by mouth every 6 (six) hours as needed for mild pain.   amiodarone 200 MG tablet Commonly known as:  PACERONE Take 1 tablet (200 mg total) by mouth daily.   ondansetron 4 MG tablet Commonly known as:  ZOFRAN Take 1 tablet (4 mg total) by mouth every 8 (eight) hours as needed for nausea or vomiting.   pantoprazole 40 MG tablet Commonly known as:  PROTONIX Take 40 mg by mouth daily.   RENVELA 800 MG tablet Generic drug:  sevelamer carbonate Take 4,800 mg by mouth 3 (three) times daily with meals.   warfarin 5  MG tablet Commonly known as:  COUMADIN Take 1 tablet (5 mg total) by mouth daily at 6 PM.       Day of Discharge BP 118/80 (BP Location: Right Arm)   Pulse 86   Temp 99.2 F (37.3 C) (Oral)   Resp 20   Ht 5\' 8"  (1.727 m)   Wt 74.9 kg   SpO2 98%   BMI 25.12 kg/m   Physical Exam: General: No acute respiratory distress Lungs: Clear to auscultation bilaterally without wheezes or crackles Cardiovascular: Regular rate and rhythm without murmur gallop or rub normal S1 and S2 Abdomen: Nontender, nondistended, soft, bowel sounds positive, no rebound, no ascites, no appreciable mass Extremities: No significant cyanosis, clubbing, or edema bilateral lower extremities  Basic Metabolic Panel: Recent Labs  Lab 08/09/18 2226 08/10/18 0341 08/11/18 0459 08/12/18 0358  NA 138 136 136 136  K 3.7 3.7 4.1 3.6  CL 105 99 100 99  CO2 17* 21* 20* 25  GLUCOSE 86 79 90 84  BUN 44* 49* 63* 25*  CREATININE 9.16* 10.25* 12.77* 8.11*  CALCIUM 6.7* 7.0* 6.7* 7.2*  MG  --   --  2.1  --   PHOS  --  3.0  --  2.3*    Liver Function Tests: Recent Labs  Lab 08/09/18 2226 08/10/18 0341 08/11/18 0459 08/12/18 0358  AST 31  --  18  --   ALT 18  --  12  --   ALKPHOS 56  --  56  --   BILITOT 0.8  --  0.2*  --   PROT 6.4*  --  5.9*  --   ALBUMIN 3.0* 2.9* 2.7* 2.6*   Recent Labs  Lab 08/10/18 0341  LIPASE 104*    Coags: Recent Labs  Lab 08/10/18 0341 08/11/18 0459 08/12/18 0358 08/13/18 0411  INR 1.14 1.28 1.29 1.55    CBC: Recent Labs  Lab 08/09/18 2226 08/11/18 0459 08/12/18 0358 08/13/18 0411  WBC 3.7* 3.1* 2.7* 3.0*  HGB 9.9* 8.6* 8.6* 9.1*  HCT 32.9* 27.6* 27.2* 29.5*  MCV 104.8* 100.0 98.9 99.7  PLT 120* 103* 107* 99*    Cardiac Enzymes: Recent Labs  Lab 08/10/18 1135 08/10/18 1652  TROPONINI 0.05* 0.04*    Time spent in discharge (includes decision making & examination of pt): 35 minutes  08/13/2018, 4:14 PM   Cherene Altes, MD [facility] Office  [phone] Pager [phone]  On-Call/Text Page:      [email or web address]      password [facility]

## 2018-08-13 NOTE — Progress Notes (Signed)
Pt had moderate amount of bleeding from LFA fistula site. Pressure was applied. Rapid Response was called, Jeannette Corpus, NP was made aware and said to stop heparin drip. Rapid Response RN came; thrombi pad was applied to  LFA fistula site. Bleeding stopped. CBC, coags, and heparin levels were drawn. BP was 169/106. HR 82. Pt is in sinus rhythm first degree AVB.

## 2018-08-13 NOTE — Evaluation (Signed)
Occupational Therapy Evaluation and Discharge Patient Details Name: Raymond [name] MRN: [id number] DOB: February 15, 1956 Today's Date: 08/13/2018    History of Present Illness Raymond [name] is a 63 y.o. male with medical history significant of nonobstructive CAD, ESRD on HD, COPD, hypertension, hypothyroidism, anemia presenting via EMS from a shelter for evaluation of chest pain, hypotension, and tachycardia. Found to have hypotension, and tachycardia, Atrial flutter with rapid ventricular response   Clinical Impression   This 63 yo male admitted with above presents to acute OT at an independent (basic ADLs) to Mod I level--made PT aware pt was independent with ambulation and Mod I with stairs so no PT needs identified. Acute OT will sign off.      Follow Up Recommendations  No OT follow up    Equipment Recommendations  None recommended by OT       Precautions / Restrictions Precautions Precautions: None Restrictions Weight Bearing Restrictions: No      Mobility Bed Mobility Overal bed mobility: Independent                Transfers Overall transfer level: Independent               General transfer comment: pt went up and down 6 steps at Mod I level (use of rail with RUE and slower pace)    Balance Overall balance assessment: No apparent balance deficits (not formally assessed)                                         ADL either performed or assessed with clinical judgement   ADL Overall ADL's : Independent                                             Vision Patient Visual Report: No change from baseline              Pertinent Vitals/Pain Pain Assessment: No/denies pain     Hand Dominance Right   Extremity/Trunk Assessment Upper Extremity Assessment Upper Extremity Assessment: Overall WFL for tasks assessed(made sure he did not use LUE during session due to bleeding issue earlier today)           Communication  Communication Communication: No difficulties   Cognition Arousal/Alertness: Awake/alert Behavior During Therapy: WFL for tasks assessed/performed Overall Cognitive Status: Within Functional Limits for tasks assessed                                       Home Living Family/patient expects to be discharged to:: Shelter/Homeless                                        Prior Functioning/Environment Level of Independence: Independent        Comments: takes bus to HD                 OT Goals(Current goals can be found in the care plan section) Acute Rehab OT Goals Patient Stated Goal: to get out of here, but not really wanting to go back to shelter due to has to sleep on  floor  OT Frequency:                AM-PAC OT "6 Clicks" Daily Activity     Outcome Measure Help from another person eating meals?: None Help from another person taking care of personal grooming?: None Help from another person toileting, which includes using toliet, bedpan, or urinal?: None Help from another person bathing (including washing, rinsing, drying)?: None Help from another person to put on and taking off regular upper body clothing?: None Help from another person to put on and taking off regular lower body clothing?: None 6 Click Score: 24   End of Session Equipment Utilized During Treatment: (none)  Activity Tolerance: Patient tolerated treatment well Patient left: (sitting EOB, no alarm on (was not on when I entered and pt is independent))  OT Visit Diagnosis: Muscle weakness (generalized) (M62.81)                Time: 9292-4462 OT Time Calculation (min): 12 min Charges:  OT General Charges $OT Visit: 1 Visit OT Evaluation $OT Eval Moderate Complexity: 1 Mod  [place] [address], OTR/L Acute [employer] Pager [phone] Office [phone]     Almon Register 08/13/2018, 9:46 AM

## 2018-08-13 NOTE — Discharge Instructions (Addendum)
Information on my medicine - Coumadin®   (Warfarin) ° °This medication education was reviewed with me or my healthcare representative as part of my discharge preparation.   ° °Why was Coumadin prescribed for you? °Coumadin was prescribed for you because you have a blood clot or a medical condition that can cause an increased risk of forming blood clots. Blood clots can cause serious health problems by blocking the flow of blood to the heart, lung, or brain. Coumadin can prevent harmful blood clots from forming. °As a reminder your indication for Coumadin is:   Stroke Prevention Because Of Atrial Fibrillation ° °What test will check on my response to Coumadin? °While on Coumadin (warfarin) you will need to have an INR test regularly to ensure that your dose is keeping you in the desired range. The INR (international normalized ratio) number is calculated from the result of the laboratory test called prothrombin time (PT). ° °If an INR APPOINTMENT HAS NOT ALREADY BEEN MADE FOR YOU please schedule an appointment to have this lab work done by your health care provider within 7 days. °Your INR goal is usually a number between:  2 to 3 or your provider may give you a more narrow range like 2-2.5.  Ask your health care provider during an office visit what your goal INR is. ° °What  do you need to  know  About  COUMADIN? °Take Coumadin (warfarin) exactly as prescribed by your healthcare provider about the same time each day.  DO NOT stop taking without talking to the doctor who prescribed the medication.  Stopping without other blood clot prevention medication to take the place of Coumadin may increase your risk of developing a new clot or stroke.  Get refills before you run out. ° °What do you do if you miss a dose? °If you miss a dose, take it as soon as you remember on the same day then continue your regularly scheduled regimen the next day.  Do not take two doses of Coumadin at the same time. ° °Important Safety  Information °A possible side effect of Coumadin (Warfarin) is an increased risk of bleeding. You should call your healthcare provider right away if you experience any of the following: °? Bleeding from an injury or your nose that does not stop. °? Unusual colored urine (red or dark brown) or unusual colored stools (red or black). °? Unusual bruising for unknown reasons. °? A serious fall or if you hit your head (even if there is no bleeding). ° °Some foods or medicines interact with Coumadin® (warfarin) and might alter your response to warfarin. To help avoid this: °? Eat a balanced diet, maintaining a consistent amount of Vitamin K. °? Notify your provider about major diet changes you plan to make. °? Avoid alcohol or limit your intake to 1 drink for women and 2 drinks for men per day. °(1 drink is 5 oz. wine, 12 oz. beer, or 1.5 oz. liquor.) ° °Make sure that ANY health care provider who prescribes medication for you knows that you are taking Coumadin (warfarin).  Also make sure the healthcare provider who is monitoring your Coumadin knows when you have started a new medication including herbals and non-prescription products. ° °Coumadin® (Warfarin)  Major Drug Interactions  °Increased Warfarin Effect Decreased Warfarin Effect  °Alcohol (large quantities) °Antibiotics (esp. Septra/Bactrim, Flagyl, Cipro) °Amiodarone (Cordarone) °Aspirin (ASA) °Cimetidine (Tagamet) °Megestrol (Megace) °NSAIDs (ibuprofen, naproxen, etc.) °Piroxicam (Feldene) °Propafenone (Rythmol SR) °Propranolol (Inderal) °Isoniazid (INH) °Posaconazole (Noxafil) Barbiturates (Phenobarbital) °  Carbamazepine (Tegretol) Chlordiazepoxide (Librium) Cholestyramine (Questran) Griseofulvin Oral Contraceptives Rifampin Sucralfate (Carafate) Vitamin K   Coumadin (Warfarin) Major Herbal Interactions  Increased Warfarin Effect Decreased Warfarin Effect  Garlic Ginseng Ginkgo biloba Coenzyme Q10 Green tea [place]. Johns wort    Coumadin (Warfarin)  FOOD Interactions  Eat a consistent number of servings per week of foods HIGH in Vitamin K (1 serving =  cup)  Collards (cooked, or boiled & drained) Kale (cooked, or boiled & drained) Mustard greens (cooked, or boiled & drained) Parsley *serving size only =  cup Spinach (cooked, or boiled & drained) Swiss chard (cooked, or boiled & drained) Turnip greens (cooked, or boiled & drained)  Eat a consistent number of servings per week of foods MEDIUM-HIGH in Vitamin K (1 serving = 1 cup)  Asparagus (cooked, or boiled & drained) Broccoli (cooked, boiled & drained, or raw & chopped) Brussel sprouts (cooked, or boiled & drained) *serving size only =  cup Lettuce, raw (green leaf, endive, romaine) Spinach, raw Turnip greens, raw & chopped   These websites have more information on Coumadin (warfarin):  FailFactory.se; VeganReport.com.au;   Atrial Fibrillation  Atrial fibrillation is a type of heartbeat that is irregular or fast (rapid). If you have this condition, your heart beats without any order. This makes it hard for your heart to pump blood in a normal way. Having this condition gives you more risk for stroke, heart failure, and other heart problems. Atrial fibrillation may start all of a sudden and then stop on its own, or it may become a long-lasting problem. What are the causes? This condition may be caused by heart conditions, such as:  High blood pressure.  Heart failure.  Heart valve disease.  Heart surgery. Other causes include:  Pneumonia.  Obstructive sleep apnea.  Lung cancer.  Thyroid disease.  Drinking too much alcohol. Sometimes the cause is not known. What increases the risk? You are more likely to develop this condition if:  You smoke.  You are older.  You have diabetes.  You are overweight.  You have a family history of this condition.  You exercise often and hard. What are the signs or symptoms? Common symptoms of this  condition include:  A feeling like your heart is beating very fast.  Chest pain.  Feeling short of breath.  Feeling light-headed or weak.  Getting tired easily. Follow these instructions at home: Medicines  Take over-the-counter and prescription medicines only as told by your doctor.  If your doctor gives you a blood-thinning medicine, take it exactly as told. Taking too much of it can cause bleeding. Taking too little of it does not protect you against clots. Clots can cause a stroke. Lifestyle      Do not use any tobacco products. These include cigarettes, chewing tobacco, and e-cigarettes. If you need help quitting, ask your doctor.  Do not drink alcohol.  Do not drink beverages that have caffeine. These include coffee, soda, and tea.  Follow diet instructions as told by your doctor.  Exercise regularly as told by your doctor. General instructions  If you have a condition that causes breathing to stop for a short period of time (apnea), treat it as told by your doctor.  Keep a healthy weight. Do not use diet pills unless your doctor says they are safe for you. Diet pills may make heart problems worse.  Keep all follow-up visits as told by your doctor. This is important. Contact a doctor if:  You notice a change  in the speed, rhythm, or strength of your heartbeat.  You are taking a blood-thinning medicine and you see more bruising.  You get tired more easily when you move or exercise.  You have a sudden change in weight. Get help right away if:   You have pain in your chest or your belly (abdomen).  You have trouble breathing.  You have blood in your vomit, poop, or pee (urine).  You have any signs of a stroke. "BE FAST" is an easy way to remember the main warning signs: ? B - Balance. Signs are dizziness, sudden trouble walking, or loss of balance. ? E - Eyes. Signs are trouble seeing or a change in how you see. ? F - Face. Signs are sudden weakness or  loss of feeling in the face, or the face or eyelid drooping on one side. ? A - Arms. Signs are weakness or loss of feeling in an arm. This happens suddenly and usually on one side of the body. ? S - Speech. Signs are sudden trouble speaking, slurred speech, or trouble understanding what people say. ? T - Time. Time to call emergency services. Write down what time symptoms started.  You have other signs of a stroke, such as: ? A sudden, very bad headache with no known cause. ? Feeling sick to your stomach (nausea). ? Throwing up (vomiting). ? Jerky movements you cannot control (seizure). These symptoms may be an emergency. Do not wait to see if the symptoms will go away. Get medical help right away. Call your local emergency services (911 in the [place] Do not drive yourself to the hospital. Summary  Atrial fibrillation is a type of heartbeat that is irregular or fast (rapid).  You are at higher risk of this condition if you smoke, are older, have diabetes, or are overweight.  Follow your doctor's instructions about medicines, diet, exercise, and follow-up visits.  Get help right away if you think that you have signs of a stroke. This information is not intended to replace advice given to you by your health care provider. Make sure you discuss any questions you have with your health care provider. Document Released: 04/05/2008 Document Revised: 08/18/2017 Document Reviewed: 08/18/2017 Elsevier Interactive Patient Education  2019 [place].   Thoracic Aortic Aneurysm  An aneurysm is a bulge in an artery. It happens when blood pushes up against a weakened or damaged artery wall. A thoracic aortic aneurysm is an aneurysm that occurs in the first part of the aorta, between the heart and the diaphragm. The aorta is the main artery of the body. It supplies blood from the heart to the rest of the body. Some aneurysms may not cause symptoms or problems. However, the major concern with a  thoracic aortic aneurysm is that it can enlarge and burst (rupture), or blood can flow between the layers of the wall of the aorta through a tear (aorticdissection). Both of these conditions can cause bleeding inside the body and can be life threatening if they are not diagnosed and treated right away. What are the causes? The exact cause of this condition is not known. What increases the risk? The following factors may make you more likely to develop this condition:  Being 56 or older.  Having hardening of the arteries caused by the buildup of fat and other substances in the lining of a blood vessel (arteriosclerosis).  Having inflammation of the walls of an artery (arteritis).  Having a genetic disease that weakens the body's  connective tissue, such as Marfan syndrome.  Having a family history of aneurysms.  Having an injury or trauma to the aorta.  Using tobacco.  Having high blood pressure (hypertension).  Having high cholesterol.  Having an infection that is caused by bacteria, such as syphilis or staphylococcus, in the wall of the aorta (infectious aortitis). What are the signs or symptoms? Symptoms of this condition vary depending on the size of the aneurysm and how fast it is growing. Most grow slowly and do not cause symptoms. When symptoms do occur, they may include:  Pain in the chest, back, sides, or abdomen. The pain may vary in intensity. Sudden, severe pain may indicate that the aneurysm has ruptured.  Hoarseness.  Cough.  Shortness of breath.  Swallowing problems.  Swelling in the face, arms, or legs.  Fever.  Unexplained weight loss. How is this diagnosed? This condition may be diagnosed with:  An ultrasound.  X-rays.  A CT scan.  An MRI.  Tests to check the arteries for damage or blockages (angiogram). Most unruptured thoracic aortic aneurysms cause no symptoms, so they are often found during exams for other conditions. How is this  treated? Treatment for this condition depends on:  The size of the aneurysm.  How fast the aneurysm is growing.  Your age.  Risk factors for rupture. Small aneurysms (2.2 inches, or 5.5 cm, or less) may be managed with:  Medicines to: ? Control blood pressure. ? Manage pain. ? Fight infection.  Regular monitoring. This may include an ultrasound or CT scan every year or every 6 months to see if the aneurysm is getting bigger. Large or fast-growing aneurysms may be treated with surgery. Follow these instructions at home: Eating and drinking  Eat a healthy diet. Your health care provider may recommend that you: ? Lower your salt (sodium) intake. In some people, too much salt can raise blood pressure and increase the risk for thoracic aortic aneurysm. ? Avoid foods that are high in saturated fat and cholesterol, such as red meat and dairy. ? Eat a diet that is low in sugar. ? Increase your fiber intake by including whole grains, vegetables, and fruits in your diet. Eating these foods may help to lower your blood pressure.  Limit or avoid alcohol as recommended by your health care provider. Lifestyle  Do not use any products that contain nicotine or tobacco, such as cigarettes and e-cigarettes. If you need help quitting, ask your health care provider.  Maintain a healthy weight.  Check your blood pressure regularly. Follow your health care provider's instructions on how to keep your blood pressure within normal limits.  Have your blood sugar (glucose) level and cholesterol levels checked regularly. Follow your health care provider's instructions on how to keep levels within normal limits. Activity   Stay physically active and exercise regularly. Talk with your health care provider about how often you should exercise and ask which types of exercise are best for you.  Avoid heavy lifting and activities that take a lot of effort. Ask your health care provider what activities are  safe for you. General instructions  Take over-the-counter and prescription medicines only as told by your health care provider.  Talk with your health care provider about regular screenings to see if the aneurysm is getting bigger.  Keep all follow-up visits as told by your health care provider. This is important. Contact a health care provider if you have:  Unexplained weight loss. Get help right away if you have:  Pain in your upper back, neck, or abdomen. This pain may move into your chest and arms.  Trouble swallowing.  A cough or hoarseness.  Shortness of breath. Summary  A thoracic aortic aneurysm is an aneurysm that occurs in the first part of the aorta, between the heart and the diaphragm.  As a thoracic aortic aneurysm becomes larger, it can burst (rupture), or blood can flow between the layers of the wall of the aorta through a tear (aorticdissection). These conditions can be life threatening if they are not diagnosed and treated right away.  If you have a thoracic aortic aneurysm, its growth will be closely monitored. Surgical repair may be needed for larger or faster-growing aneurysms. This information is not intended to replace advice given to you by your health care provider. Make sure you discuss any questions you have with your health care provider. Document Released: 06/27/2005 Document Revised: 08/08/2017 Document Reviewed: 08/08/2017 Elsevier Interactive Patient Education  2019 [employer].

## 2018-08-13 NOTE — Significant Event (Deleted)
Rapid Response Event Note  Overview: Time Called: 0403 Arrival Time: 0406 Event Type: Other (Comment)(Fistula bleeding)  Initial Focused Assessment:   Interventions:  Plan of Care (if not transferred):  Event Summary: Name of Physician Notified: Blount- NP at 0410    at    Outcome: Stayed in room and stabalized     Raymond [name]

## 2018-08-13 NOTE — Significant Event (Addendum)
Rapid Response Event Note  Overview: Time Called: 0403 Arrival Time: 0406 Event Type: Other (Comment)(Fistula bleeding) Called by bedside RN for pt with excesive bleeding from fistula. Instructed to hold pressure, get recent set of vitals and call Md.  Initial Focused Assessment: On arrival, pt laying in bed with acute distress noted. RN holidng pressure to L arm fistula. VSS, pt denies scratching arm stating he was sleeping and woke up with blood in bed.  Interventions: Pressure held, Heparin gtt stopped, thrombipad applied, CBC, vitals, NP Blount notified. Bleeding appears to have resolved with pressure. Thrombipad, gauze and tape applied to L forearm. Pt also found to have bleeding to finger on L hand from prior scratch. Pressure held and new bandage applied.  Plan of Care (if not transferred): Will continue to monitor for bleeding and check most recent blood levels. Pt instructed to keep pressure off arm and limit movement for awhile to prevent re-bleeding. Pt states understanding. RN instructed to monitor for signs of further bleeding and call with any questions or concerns.   Event Summary: Name of Physician Notified: Blount- NP at 0410    Outcome: Stayed in room and stabalized  Event ended at [place]

## 2018-08-13 NOTE — Clinical Social Work Note (Signed)
Clinical Social Work Assessment  Patient Details  Name: Raymond [name] MRN: [id number] Date of Birth: October 25, 1955  Date of referral:  08/13/18               Reason for consult:  Housing Concerns/Homelessness                Permission sought to share information with:    Permission granted to share information::     Name::        Agency::     Relationship::     Contact Information:     Housing/Transportation Living arrangements for the past 2 months:  Single Family Home, [place] Estate of Information:  Patient Patient Interpreter Needed:  None Criminal Activity/Legal Involvement Pertinent to Current Situation/Hospitalization:  No - Comment as needed Significant Relationships:  Other Family Members Lives with:  Self Do you feel safe going back to the place where you live?  No Need for family participation in patient care:  No (Coment)  Care giving concerns: Consulted for patient homelessness. Patient is on dialysis.   Social Worker assessment / plan: CSW met with patient at bedside. Patient alert and oriented. CSW introduced self and role and discussed reason for consult.   Patient reported he was living with his niece and then went to stay in [name]. He had to pay his niece rent but did not like staying with her. He signed up to stay at the shelter, but did not end up staying that first night in the shelter because he had to come to the hospital.   Patient has been on the waiting list for permanent housing through the [employer] [facility] for several years. He has Fish farm manager income. He stated that his social worker at dialysis has helped him to call the [employer] in the past. When they called a few months ago, they were told patient is still on the waiting list. Patient asked for help in calling his dialysis social worker; CSW and patient placed call together, but Education officer, museum did not answer. Patient takes the bus to dialysis.  CSW provided  list of shelters and contact information, including [facility]. Advised patient that he should call the shelter everyday to let them know he is still admitted in the hospital in hopes of holding his bed there at the shelter. Patient stated he would call the shelter today. Also provided information on affordable senior housing and on the [employer].   CSW will sign off, please re-consult if additional needs arise. CSW can support with transportation back to the shelter when patient is discharged.  Employment status:  Disabled (Comment on whether or not currently receiving Disability) Insurance information:  Medicare PT Recommendations:  No Follow Up Information / Referral to community resources:  Shelter  Patient/Family's Response to care: Patient appreciative of care and resources.  Patient/Family's Understanding of and Emotional Response to Diagnosis, Current Treatment, and Prognosis: Patient with good understanding of his conditions.  Emotional Assessment Appearance:  Appears stated age Attitude/Demeanor/Rapport:  Engaged Affect (typically observed):  Appropriate, Calm, Hopeful Orientation:  Oriented to Self, Oriented to Place, Oriented to  Time, Oriented to Situation Alcohol / Substance use:  Not Applicable Psych involvement (Current and /or in the community):  No (Comment)  Discharge Needs  Concerns to be addressed:  Homelessness Readmission within the last 30 days:  Yes Current discharge risk:  Lack of support system, Homeless Barriers to Discharge:  Continued Medical Work up  Raymond Emms, LCSW 08/13/2018, 2:45 PM

## 2018-08-13 NOTE — Progress Notes (Signed)
Subjective:  Reports avf bleed  early am  On  Coumadin noted INR  1.55 ,this am t 99.2 noted yest temp to 101.2 early am , Wu per admit / next HD tomorrow ?in hosp ,vs op . No cos now   Objective Vital signs in last 24 hours: Vitals:   08/12/18 1426 08/12/18 2107 08/13/18 0422 08/13/18 0518  BP: (!) 159/96 (!) 188/100 118/80   Pulse: 86 89 86   Resp:  19 20   Temp: 99.5 F (37.5 C) 98.3 F (36.8 C) 99.2 F (37.3 C)   TempSrc: Oral Oral Oral   SpO2: 100% 100% 98%   Weight:    74.9 kg  Height:       Weight change: 0.734 kg  Physical Exam General: Alert thin , Well appearing man, NAD Heart: RRR; no murmur, rub ,gallop  Lungs: CTAB Abdomen: soft, non-tender Extremities: No LE edema Dialysis Access:  AVF + Bruit , bandaged no bleeding noted   Dialysis Orders: TTS (just changed from MWF) at [facility] 4hr, 450/800, EDW 72kg (just raised from 71.5), 2K/2.5Ca, UF profile 4, AVF, heparin 2000 - Parsabiv 42mcg IV q HD - Mircera 147mcg IV q 2 weeks (ordered, not yet given) - Calcitriol 1.28mcg PO q HD  Problem/Plan: 1. A-flutter with RVR: Now controlled, back in NSR. Transitioned to PO amiodarone, on heparin drip while awaiting warfarin to be therapeutic.NOTED HE Lives in [place] = NEPHROLOGY Does not manage COUMADIN / INR, OP INR could be drawn at [facility], he has  A CELL PHONE for Managing  team to call /Dc Team to coordinate  2. ESRD:Usual TTS schedule, next HD 2/4. 3. Fever (08/12/18): No specific complaints,  primary team to decide to monitor v. work-up. 4. Hypertension/volume:BP good, no edema.new Incr edw  ~~74 5. Anemia:Hgb8.6>9.1, Aranesp ordered but not given - resched to start next HD. 4. Metabolic bone disease:Corr Ca 8.3/Phos 2.3. Sensipar  now on  hold. Phos low as well, reduce Renvela 3 -> 2/meals. 5     Aortic aneurysm;  CTa noted ascending thoracic aortic aneurysm measuring         4.4 cm at the level of the main pulmonary artery  6.    CAD  =mild, nonobstructive  CAD per cath September 2019      [name], PA-C [facility] Beeper [phone] 08/13/2018,8:26 AM  LOS: 2 days   Labs: Basic Metabolic Panel: Recent Labs  Lab 08/10/18 0341 08/11/18 0459 08/12/18 0358  NA 136 136 136  K 3.7 4.1 3.6  CL 99 100 99  CO2 21* 20* 25  GLUCOSE 79 90 84  BUN 49* 63* 25*  CREATININE 10.25* 12.77* 8.11*  CALCIUM 7.0* 6.7* 7.2*  PHOS 3.0  --  2.3*   Liver Function Tests: Recent Labs  Lab 08/09/18 2226 08/10/18 0341 08/11/18 0459 08/12/18 0358  AST 31  --  18  --   ALT 18  --  12  --   ALKPHOS 56  --  56  --   BILITOT 0.8  --  0.2*  --   PROT 6.4*  --  5.9*  --   ALBUMIN 3.0* 2.9* 2.7* 2.6*   Recent Labs  Lab 08/10/18 0341  LIPASE 104*   No results for input(s): AMMONIA in the last 168 hours. CBC: Recent Labs  Lab 08/09/18 2226 08/11/18 0459 08/12/18 0358 08/13/18 0411  WBC 3.7* 3.1* 2.7* 3.0*  HGB 9.9* 8.6* 8.6* 9.1*  HCT 32.9* 27.6* 27.2*  29.5*  MCV 104.8* 100.0 98.9 99.7  PLT 120* 103* 107* 99*   Cardiac Enzymes: Recent Labs  Lab 08/10/18 1135 08/10/18 1652  TROPONINI 0.05* 0.04*   CBG: No results for input(s): GLUCAP in the last 168 hours.  Studies/Results: No results found. Medications: . heparin Stopped (08/13/18 0415)   . amiodarone  200 mg Oral Q12H   Followed by  . [START ON 08/19/2018] amiodarone  200 mg Oral Daily  . calcitRIOL  0.75 mcg Oral Q M,W,F-HD  . Chlorhexidine Gluconate Cloth  6 each Topical Q0600  . [START ON 08/14/2018] darbepoetin (ARANESP) injection - DIALYSIS  100 mcg Intravenous Q Tue-HD  . sevelamer carbonate  3,200 mg Oral TID WC  . warfarin  5 mg Oral q1800  . Warfarin - Pharmacist Dosing Inpatient   Does not apply [phone]

## 2018-08-13 NOTE — Progress Notes (Signed)
PT Cancellation Note  Patient Details Name: TASHAWN LASWELL MRN: [id number] DOB: May 14, 1956   Cancelled Treatment:    Reason Eval/Treat Not Completed: PT screened, no needs identified, will sign off   Denice Paradise 08/13/2018, 9:29 AM Nasif Bos,PT Acute Rehabilitation Services Pager:  [phone]  Office:  [phone]

## 2018-08-14 ENCOUNTER — Ambulatory Visit: Payer: Self-pay | Admitting: Vascular Surgery

## 2018-08-14 ENCOUNTER — Inpatient Hospital Stay (HOSPITAL_COMMUNITY): Admission: RE | Admit: 2018-08-14 | Payer: Self-pay | Source: Ambulatory Visit

## 2018-08-14 ENCOUNTER — Encounter: Payer: Self-pay | Admitting: Family

## 2018-08-15 ENCOUNTER — Emergency Department (HOSPITAL_COMMUNITY)
Admission: EM | Admit: 2018-08-15 | Discharge: 2018-08-15 | Disposition: A | Discharge status: Home / Self Care | Payer: Medicare Other | Attending: Emergency Medicine | Admitting: Emergency Medicine

## 2018-08-15 ENCOUNTER — Emergency Department (HOSPITAL_COMMUNITY): Payer: Medicare Other

## 2018-08-15 ENCOUNTER — Encounter (HOSPITAL_COMMUNITY): Payer: Self-pay | Admitting: Emergency Medicine

## 2018-08-15 DIAGNOSIS — R079 Chest pain, unspecified: Secondary | ICD-10-CM

## 2018-08-15 DIAGNOSIS — Z79899 Other long term (current) drug therapy: Secondary | ICD-10-CM | POA: Diagnosis not present

## 2018-08-15 DIAGNOSIS — I120 Hypertensive chronic kidney disease with stage 5 chronic kidney disease or end stage renal disease: Secondary | ICD-10-CM | POA: Insufficient documentation

## 2018-08-15 DIAGNOSIS — I2510 Atherosclerotic heart disease of native coronary artery without angina pectoris: Secondary | ICD-10-CM | POA: Insufficient documentation

## 2018-08-15 DIAGNOSIS — E7800 Pure hypercholesterolemia, unspecified: Secondary | ICD-10-CM | POA: Insufficient documentation

## 2018-08-15 DIAGNOSIS — I480 Paroxysmal atrial fibrillation: Secondary | ICD-10-CM | POA: Insufficient documentation

## 2018-08-15 DIAGNOSIS — R0789 Other chest pain: Secondary | ICD-10-CM | POA: Diagnosis not present

## 2018-08-15 DIAGNOSIS — N186 End stage renal disease: Secondary | ICD-10-CM | POA: Diagnosis not present

## 2018-08-15 DIAGNOSIS — R0602 Shortness of breath: Secondary | ICD-10-CM | POA: Diagnosis not present

## 2018-08-15 DIAGNOSIS — J449 Chronic obstructive pulmonary disease, unspecified: Secondary | ICD-10-CM | POA: Diagnosis not present

## 2018-08-15 DIAGNOSIS — I10 Essential (primary) hypertension: Secondary | ICD-10-CM | POA: Diagnosis not present

## 2018-08-15 DIAGNOSIS — Z7901 Long term (current) use of anticoagulants: Secondary | ICD-10-CM | POA: Insufficient documentation

## 2018-08-15 DIAGNOSIS — R05 Cough: Secondary | ICD-10-CM | POA: Diagnosis not present

## 2018-08-15 LAB — I-STAT TROPONIN, ED
Troponin i, poc: 0.03 ng/mL (ref 0.00–0.08)
Troponin i, poc: 0.02 ng/mL (ref 0.00–0.08)

## 2018-08-15 LAB — CBC
HCT: 26 % — ABNORMAL LOW (ref 39.0–52.0)
Hemoglobin: 7.8 g/dL — ABNORMAL LOW (ref 13.0–17.0)
MCH: 30 pg (ref 26.0–34.0)
MCHC: 30 g/dL (ref 30.0–36.0)
MCV: 100 fL (ref 80.0–100.0)
Platelets: 96 10*3/uL — ABNORMAL LOW (ref 150–400)
RBC: 2.6 MIL/uL — AB (ref 4.22–5.81)
RDW: 16 % — ABNORMAL HIGH (ref 11.5–15.5)
WBC: 2.2 10*3/uL — ABNORMAL LOW (ref 4.0–10.5)
nRBC: 0 % (ref 0.0–0.2)

## 2018-08-15 LAB — BASIC METABOLIC PANEL
Anion gap: 16 — ABNORMAL HIGH (ref 5–15)
BUN: 64 mg/dL — ABNORMAL HIGH (ref 8–23)
CO2: 23 mmol/L (ref 22–32)
Calcium: 7.5 mg/dL — ABNORMAL LOW (ref 8.9–10.3)
Chloride: 97 mmol/L — ABNORMAL LOW (ref 98–111)
Creatinine, Ser: 16.04 mg/dL — ABNORMAL HIGH (ref 0.61–1.24)
GFR calc Af Amer: 3 mL/min — ABNORMAL LOW (ref >60)
GFR calc non Af Amer: 3 mL/min — ABNORMAL LOW (ref >60)
Glucose, Bld: 74 mg/dL (ref 70–99)
POTASSIUM: 4.9 mmol/L (ref 3.5–5.1)
SODIUM: 136 mmol/L (ref 135–145)

## 2018-08-15 IMAGING — CR DG CHEST 2V
2 series · 2 of 2 positions shown · non-contrast
Comparison: [DATE]

CLINICAL DATA: Chest pain and shortness of breath

EXAM:
CHEST - 2 VIEW

[chest pa]
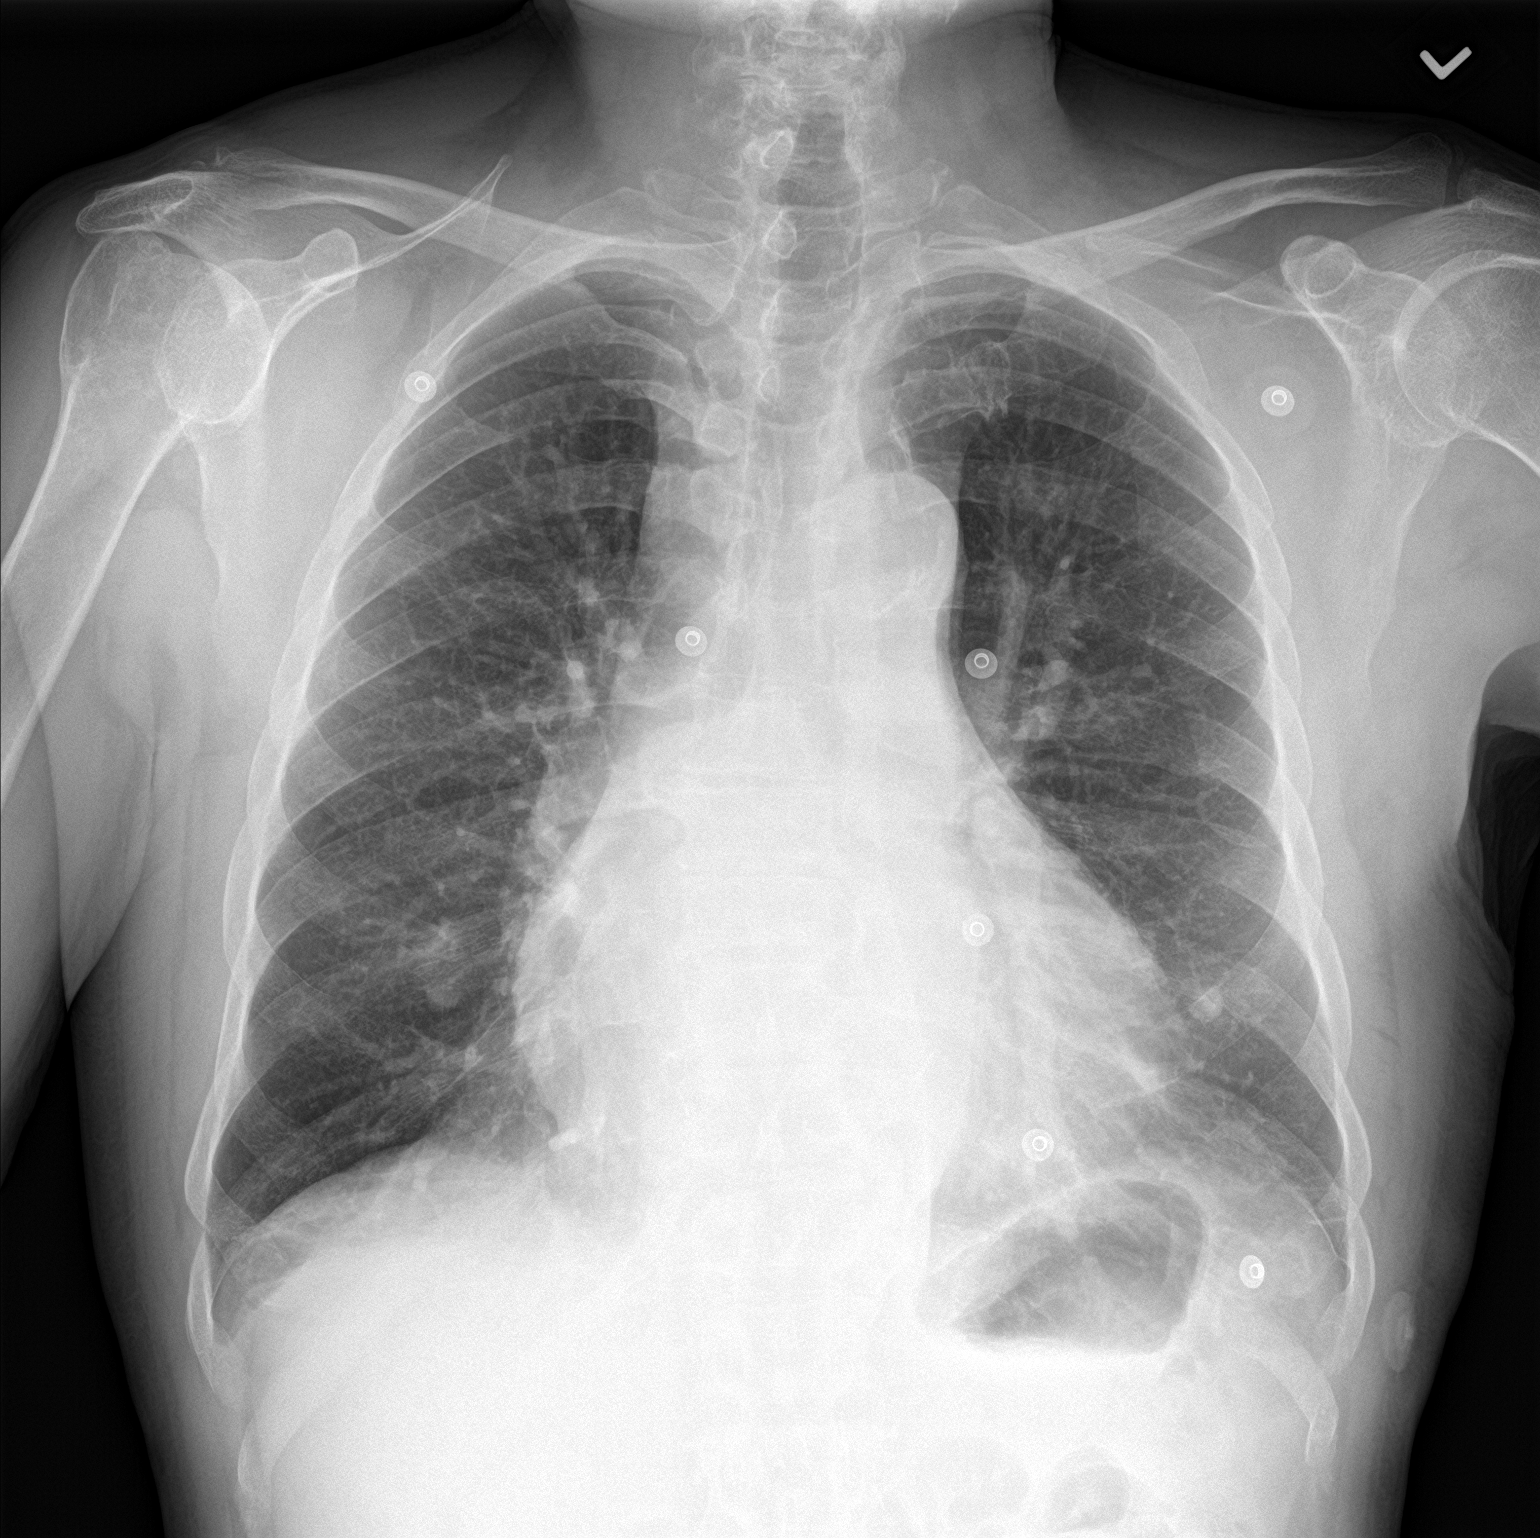

[chest lat]
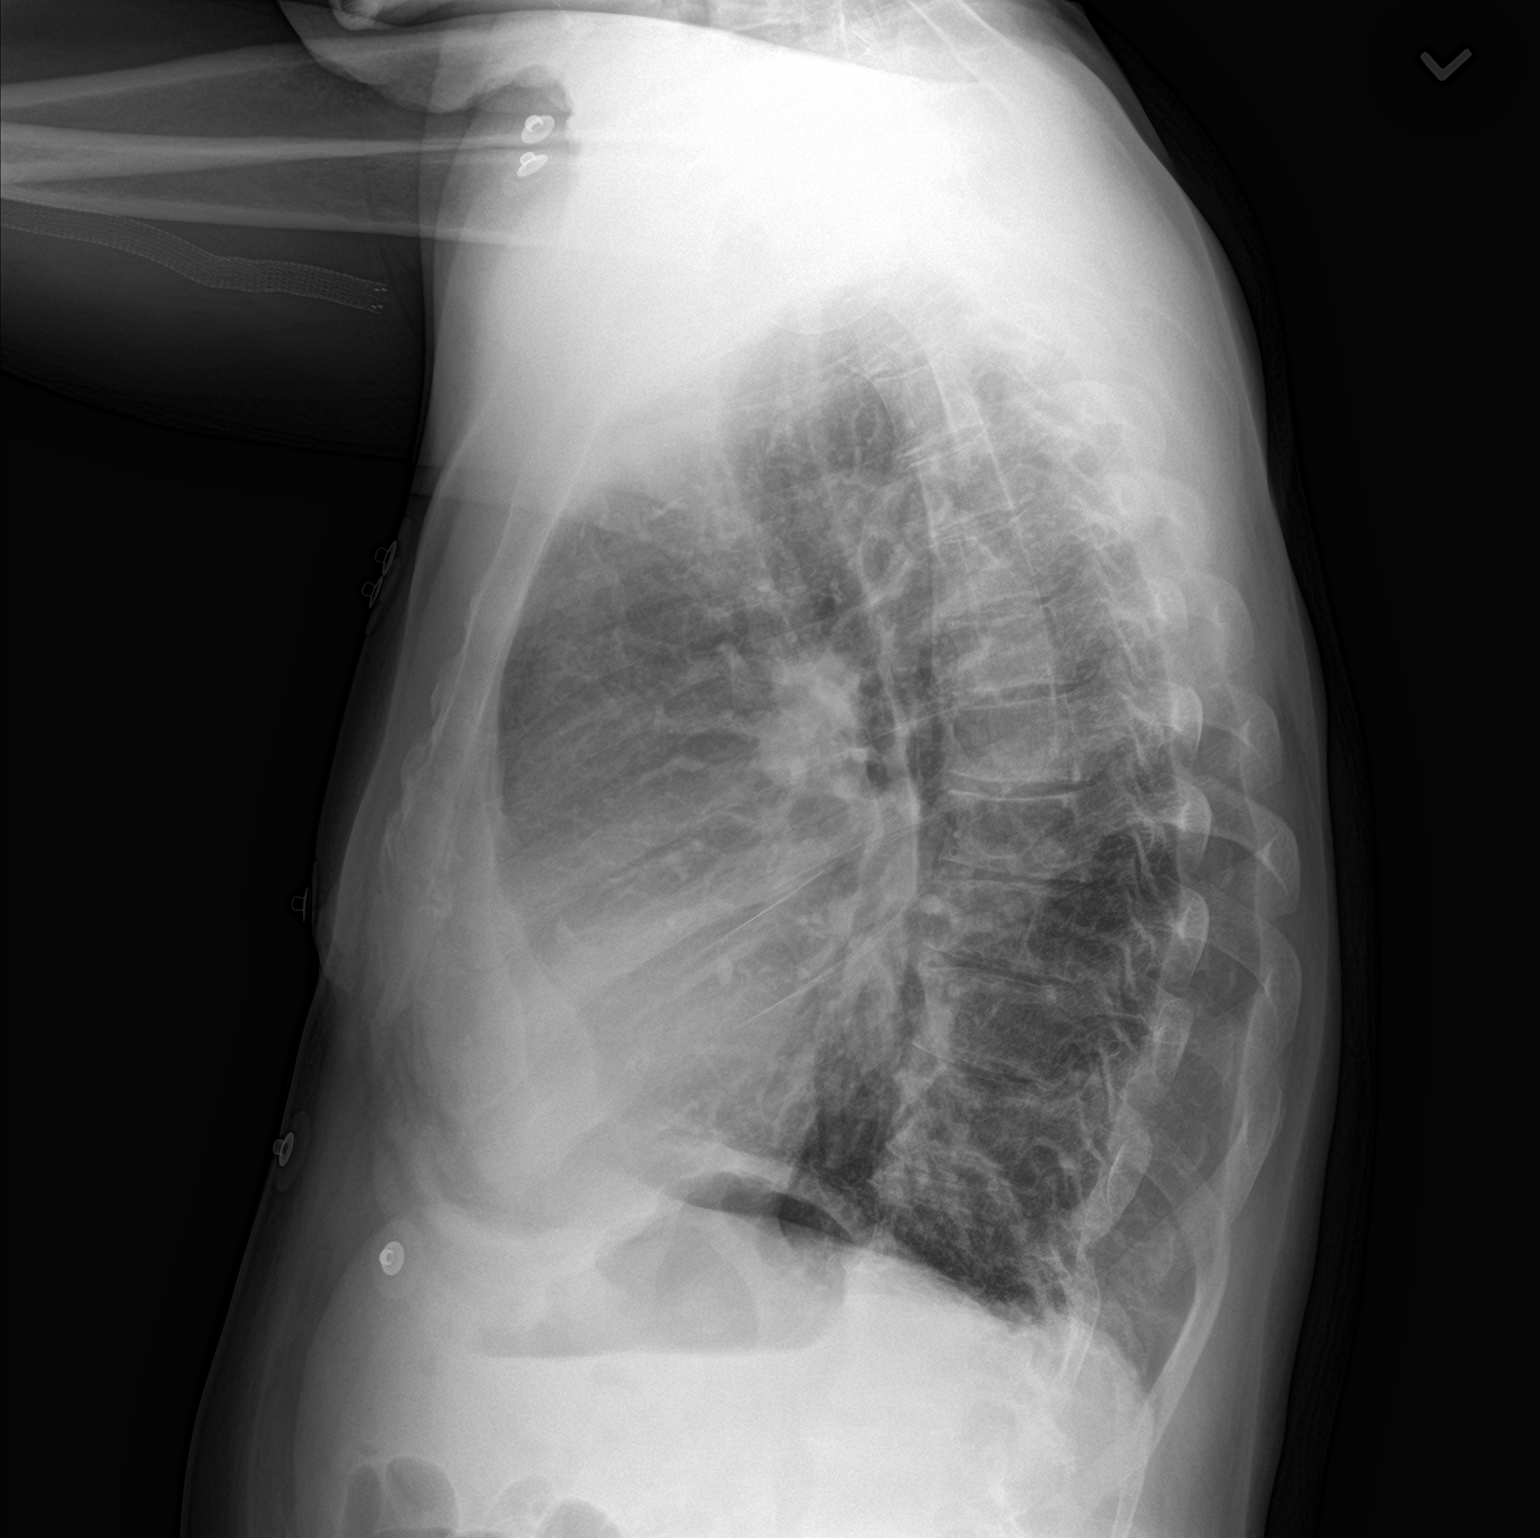

[2 of 2 positions shown; findings below may reference images not displayed]

FINDINGS: Chronic cardiomegaly. Stable mediastinal contours. Bilateral nipple
shadows. There is no edema, consolidation, effusion, or
pneumothorax.
IMPRESSION: 1. No evidence of acute disease.
2. Cardiomegaly.

## 2018-08-15 MED ORDER — SODIUM CHLORIDE 0.9% FLUSH: 3.0000 mL | Freq: Once | INTRAVENOUS | Status: DC

## 2018-08-15 NOTE — ED Provider Notes (Signed)
[facility] EMERGENCY DEPARTMENT Provider Note   CSN: [id number] Arrival date & time: 08/15/18  3154     History   Chief Complaint Chief Complaint  Patient presents with  . Chest Pain    HPI Raymond [name] is a 63 y.o. male.  He is presenting by EMS from a hotel room for evaluation of chest pain.  He said it started yesterday morning when he woke up with sharp and intermittent throughout the day.  He points to his left upper chest.  He is also had shortness of breath.  He has a history of end-stage renal disease and is also homeless and has difficulty getting to dialysis appointments.  He said his last dialysis was the day he was discharged from the hospital 2/03.  No fevers or chills.  No abdominal pain.  He says the pain has let up and he rates that is just mild now.  He has had this before.  Last cardiac cath was September 2019 showing mild nonobstructive coronary disease.  The history is provided by the patient.  Chest Pain  Pain location:  L chest Pain quality: stabbing   Pain radiates to:  Does not radiate Pain severity:  Moderate Onset quality:  Gradual Duration:  1 day Timing:  Intermittent Progression:  Improving Chronicity:  Recurrent Context: at rest   Relieved by:  None tried Worsened by:  Nothing Ineffective treatments:  None tried Associated symptoms: lower extremity edema and shortness of breath   Associated symptoms: no abdominal pain, no back pain, no cough, no diaphoresis, no fever, no headache, no near-syncope, no syncope and no vomiting   Risk factors: male sex     Past Medical History:  Diagnosis Date  . Anemia   . CAD (coronary artery disease)    a. 03/2018: cath showing 20 to 30% stenosis along the LAD, luminal irregularities along the RCA, and angiographically normal LCx  . COPD (chronic obstructive pulmonary disease) ([place])   . Dyspnea    "with too much fluid"  . ESRD (end stage renal disease) on dialysis [facility])    "MWF; [facility] Rinks" (07/22/17)  . Hemodialysis patient ([place])   . Hepatitis C    Still positive s/p liver biopsy at [facility]  and interferon therapy for 6 months. Most recent lab work was on 10/24/12  . Hepatitis C    "took the tx; gone now" (12/05/2016)  Was treated  . History of blood transfusion ~ 2012/2013   "related to my kidneys; blood was low"  . Hypertension   . Substance abuse ([place])   . Thyroid disease     Patient Active Problem List   Diagnosis Date Noted  . Atrial flutter with rapid ventricular response ([place]) 08/10/2018  . PAF (paroxysmal atrial fibrillation) ([place]) 08/01/2018  . Acute respiratory failure with hypoxia ([place]) 07/13/2018  . Noncompliance of patient with renal dialysis ([place]) 07/13/2018  . PAD (peripheral artery disease) ([place]) 04/11/2018  . Sepsis ([place]) 03/31/2018  . Preop cardiovascular exam 03/29/2018  . Volume overload 08/07/2017  . Acute pulmonary edema ([facility])   . Dyspnea 06/20/2017  . Acute bronchitis   . COPD (chronic obstructive pulmonary disease) ([place])   . End-stage renal disease on hemodialysis ([place])   . Hypoxia 12/05/2016  . Hyperkalemia 12/19/2015  . Hypoglycemia 12/19/2015  . Polysubstance abuse ([place]) 12/19/2015  . Homelessness 12/19/2015  . Anemia associated with stage 5 chronic renal failure ([place]) 12/19/2015  . Atypical chest pain 12/19/2013  . Atherosclerosis of  native arteries of extremity with intermittent claudication ([place]) 12/04/2012  . ESRD (end stage renal disease) ([place]) 12/04/2012  . HEPATITIS C 09/07/2006  . HYPERCHOLESTEROLEMIA 09/07/2006  . HYPERTENSION, BENIGN SYSTEMIC 09/07/2006    Past Surgical History:  Procedure Laterality Date  . ABDOMINAL AORTOGRAM W/LOWER EXTREMITY N/A 02/08/2018   Procedure: ABDOMINAL AORTOGRAM W/LOWER EXTREMITY;  Surgeon: Conrad Belle, MD;  Location: [facility] CV LAB;  Service: Cardiovascular;  Laterality: N/A;  . AV FISTULA PLACEMENT Left Aug. 2013 ?  . FEMORAL-POPLITEAL BYPASS GRAFT Left 04/11/2018   Procedure:  BYPASS GRAFT FEMORAL-POPLITEAL ARTERY LEFT LEG;  Surgeon: Rosetta Posner, MD;  Location: [facility];  Service: Vascular;  Laterality: Left;  . LEFT HEART CATH AND CORONARY ANGIOGRAPHY N/A 03/29/2018   Procedure: LEFT HEART CATH AND CORONARY ANGIOGRAPHY;  Surgeon: Nelva Bush, MD;  Location: [facility] CV LAB;  Service: Cardiovascular;  Laterality: N/A;  . LIVER BIOPSY    . ORIF ULNAR FRACTURE Left 05/18/2017   Procedure: OPEN REDUCTION INTERNAL FIXATION (ORIF) ULNAR FRACTURE;  Surgeon: Iran Planas, MD;  Location: [facility];  Service: Orthopedics;  Laterality: Left;  . SHUNTOGRAM N/A 12/11/2012   Procedure: Earney Mallet;  Surgeon: Serafina Mitchell, MD;  Location: [facility] CATH LAB;  Service: Cardiovascular;  Laterality: N/A;        Home Medications    Prior to Admission medications   Medication Sig Start Date End Date Taking? Authorizing Provider  acetaminophen (TYLENOL) 325 MG tablet Take 650 mg by mouth every 6 (six) hours as needed for mild pain.    [provider]  amiodarone (PACERONE) 200 MG tablet Take 1 tablet (200 mg total) by mouth daily. 08/13/18   Cherene Altes, MD  ondansetron (ZOFRAN) 4 MG tablet Take 1 tablet (4 mg total) by mouth every 8 (eight) hours as needed for nausea or vomiting. 04/03/18   Thurnell Lose, MD  pantoprazole (PROTONIX) 40 MG tablet Take 40 mg by mouth daily.    [provider]  sevelamer carbonate (RENVELA) 800 MG tablet Take 4,800 mg by mouth 3 (three) times daily with meals.     [provider]  warfarin (COUMADIN) 5 MG tablet Take 1 tablet (5 mg total) by mouth daily at 6 PM. 08/13/18   Cherene Altes, MD    Family History Family History  Problem Relation Age of Onset  . Diabetes Father   . Hypertension Father   . Heart disease Father     Social History Social History   Tobacco Use  . Smoking status: Current Every Day Smoker    Packs/day: 1.00    Years: 25.00    Pack years: 25.00    Types: Cigarettes    Last attempt to  quit: 08/01/2011    Years since quitting: 7.0  . Smokeless tobacco: Never Used  Substance Use Topics  . Alcohol use: Yes    Comment: occasionally  . Drug use: Yes    Types: Cocaine    Comment: clean x approx 2 months (07/22/17)     Allergies   Patient has no known allergies.   Review of Systems Review of Systems  Constitutional: Negative for diaphoresis and fever.  HENT: Negative for sore throat.   Eyes: Negative for visual disturbance.  Respiratory: Positive for shortness of breath. Negative for cough.   Cardiovascular: Positive for chest pain and leg swelling. Negative for syncope and near-syncope.  Gastrointestinal: Negative for abdominal pain and vomiting.  Genitourinary: Negative for flank pain.  Musculoskeletal: Negative for  back pain.  Skin: Negative for rash.  Neurological: Negative for headaches.     Physical Exam Updated Vital Signs BP (!) 158/88 (BP Location: Right Arm)   Pulse 78   Temp 97.9 F (36.6 C) (Oral)   Resp 17   Ht 5\' 8"  (1.727 m)   Wt 78.9 kg   SpO2 97%   BMI 26.46 kg/m   Physical Exam Vitals signs and nursing note reviewed.  Constitutional:      Appearance: He is well-developed.  HENT:     Head: Normocephalic and atraumatic.  Eyes:     Conjunctiva/sclera: Conjunctivae normal.  Neck:     Musculoskeletal: Neck supple.  Cardiovascular:     Rate and Rhythm: Normal rate and regular rhythm.  Pulmonary:     Effort: Pulmonary effort is normal. No respiratory distress.     Breath sounds: Normal breath sounds.  Chest:     Chest wall: Tenderness (left upper chest) present.  Abdominal:     Palpations: Abdomen is soft.     Tenderness: There is no abdominal tenderness.  Musculoskeletal:     Right lower leg: He exhibits no tenderness. Edema present.     Left lower leg: He exhibits no tenderness. Edema present.  Skin:    General: Skin is warm and dry.     Capillary Refill: Capillary refill takes less than 2 seconds.  Neurological:      General: No focal deficit present.     Mental Status: He is alert.      ED Treatments / Results  Labs (all labs ordered are listed, but only abnormal results are displayed) Labs Reviewed  BASIC METABOLIC PANEL - Abnormal; Notable for the following components:      Result Value   Chloride 97 (*)    BUN 64 (*)    Creatinine, Ser 16.04 (*)    Calcium 7.5 (*)    GFR calc non Af Amer 3 (*)    GFR calc Af Amer 3 (*)    Anion gap 16 (*)    All other components within normal limits  CBC - Abnormal; Notable for the following components:   WBC 2.2 (*)    RBC 2.60 (*)    Hemoglobin 7.8 (*)    HCT 26.0 (*)    RDW 16.0 (*)    Platelets 96 (*)    All other components within normal limits  I-STAT TROPONIN, ED  I-STAT TROPONIN, ED    EKG EKG Interpretation  Date/Time:  Wednesday August 15 2018 04:24:00 EST Ventricular Rate:  79 PR Interval:  234 QRS Duration: 92 QT Interval:  436 QTC Calculation: 499 R Axis:   43 Text Interpretation:  Sinus rhythm with 1st degree A-V block Prolonged QT Abnormal ECG sinus replacing flutter 1/20 Confirmed by Aletta Edouard [phone]) on 08/15/2018 8:03:29 AM   Radiology Dg Chest 2 View  Result Date: 08/15/2018 CLINICAL DATA:  Chest pain and shortness of breath EXAM: CHEST - 2 VIEW COMPARISON:  08/09/2018 FINDINGS: Chronic cardiomegaly. Stable mediastinal contours. Bilateral nipple shadows. There is no edema, consolidation, effusion, or pneumothorax. IMPRESSION: 1. No evidence of acute disease. 2. Cardiomegaly. Electronically Signed   By: Monte Fantasia M.D.   On: 08/15/2018 05:04    Procedures Procedures (including critical care time)  Medications Ordered in ED Medications - No data to display   Initial Impression / Assessment and Plan / ED Course  I have reviewed the triage vital signs and the nursing notes.  Pertinent labs & imaging  results that were available during my care of the patient were reviewed by me and considered in my medical  decision making (see chart for details).  Clinical Course as of Aug 15 1816  Wed Aug 15, 2748  430 63 year old male with end-stage renal disease homelessness here with sharp chest pain intermittently since yesterday morning.  EKG and first troponin unremarkable.  Labs are abnormal but close to baseline and a noncritical potassium of 4.9.   [MB]  0854 Delta troponin negative.  I have asked social work to talk to the patient regarding his living situation although it sounds like he was given all this information on discharge a couple of days ago.   [MB]  3200 Patient was seen by social work and she says he has all the resources from before.  He is on a wait list for housing.  No see any medical indications for admitting him.   [MB]    Clinical Course User Index [MB] Hayden Rasmussen, MD    Final Clinical Impressions(s) / ED Diagnoses   Final diagnoses:  Nonspecific chest pain  SOB (shortness of breath)  ESRD (end stage renal disease) [facility])    ED Discharge Orders    None       Hayden Rasmussen, MD 08/15/18 1819

## 2018-08-15 NOTE — Discharge Instructions (Signed)
You were evaluated in the emergency department for chest pain and shortness of breath.  You had an EKG chest x-ray and blood work that did not show an obvious cause of your symptoms.  This likely has to do with you missing dialysis and will be important for you to go to dialysis as scheduled Monday Wednesday and Friday.  You were seen by Education officer, museum and she checked that you had all the resources available as far as housing and shelters.
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
## 2018-08-15 NOTE — Progress Notes (Signed)
CSW went to speak with pt at bedside. CSW was informed that pt is homeless and on dialysis Per previous CSW notes, they have been working with pt in finding housing. CSw aware that local shelters have been called as well as previous CSW has even reached out to the [employer] to see if pt is still on the list for housing.    CSW updated pt at bedside as well called local shelters where CSW left voicemail as requested by pt. CSW began speaking with pt about Anner Crete a homeless advocate in [place]. During this time pt expressed being angry that he has done this to himself. CSW sought further details on this as pt then verbalized using drugs and alcohol on last night. CSW went to update MD of concerns of SI as pt expressed not wanting to go back to dialysis until a home has been found. When CSW return to room pt was leaving. CSW attempted to ask pt to stay and pt began putting coat on.   CSW has spoke with Anner Crete and made her aware of pt. Anner Crete to follow up with pt at best.   There are no further CSW needs. CSW will sign off.     Virgie Dad. Sumayyah Custodio, MSW, [facility] Clinical Social Worker [phone]

## 2018-08-18 ENCOUNTER — Emergency Department (HOSPITAL_COMMUNITY): Payer: Medicare Other

## 2018-08-18 ENCOUNTER — Inpatient Hospital Stay (HOSPITAL_COMMUNITY)
Admission: EM | Admit: 2018-08-18 | Discharge: 2018-08-22 | DRG: 640 | Discharge status: Against Medical Advice | Payer: Medicare Other | Attending: Internal Medicine | Admitting: Internal Medicine

## 2018-08-18 ENCOUNTER — Encounter (HOSPITAL_COMMUNITY): Payer: Self-pay | Admitting: Student

## 2018-08-18 ENCOUNTER — Other Ambulatory Visit: Payer: Self-pay

## 2018-08-18 DIAGNOSIS — R0602 Shortness of breath: Secondary | ICD-10-CM | POA: Diagnosis not present

## 2018-08-18 DIAGNOSIS — I120 Hypertensive chronic kidney disease with stage 5 chronic kidney disease or end stage renal disease: Secondary | ICD-10-CM | POA: Diagnosis present

## 2018-08-18 DIAGNOSIS — E7800 Pure hypercholesterolemia, unspecified: Secondary | ICD-10-CM | POA: Diagnosis present

## 2018-08-18 DIAGNOSIS — I712 Thoracic aortic aneurysm, without rupture: Secondary | ICD-10-CM | POA: Diagnosis present

## 2018-08-18 DIAGNOSIS — D631 Anemia in chronic kidney disease: Secondary | ICD-10-CM | POA: Diagnosis present

## 2018-08-18 DIAGNOSIS — D62 Acute posthemorrhagic anemia: Secondary | ICD-10-CM | POA: Diagnosis not present

## 2018-08-18 DIAGNOSIS — N186 End stage renal disease: Secondary | ICD-10-CM | POA: Diagnosis present

## 2018-08-18 DIAGNOSIS — Z9114 Patient's other noncompliance with medication regimen: Secondary | ICD-10-CM

## 2018-08-18 DIAGNOSIS — Z992 Dependence on renal dialysis: Secondary | ICD-10-CM | POA: Diagnosis not present

## 2018-08-18 DIAGNOSIS — D123 Benign neoplasm of transverse colon: Secondary | ICD-10-CM | POA: Diagnosis present

## 2018-08-18 DIAGNOSIS — F1920 Other psychoactive substance dependence, uncomplicated: Secondary | ICD-10-CM | POA: Diagnosis present

## 2018-08-18 DIAGNOSIS — K31819 Angiodysplasia of stomach and duodenum without bleeding: Secondary | ICD-10-CM | POA: Diagnosis present

## 2018-08-18 DIAGNOSIS — E8770 Fluid overload, unspecified: Secondary | ICD-10-CM | POA: Diagnosis present

## 2018-08-18 DIAGNOSIS — D649 Anemia, unspecified: Secondary | ICD-10-CM

## 2018-08-18 DIAGNOSIS — J449 Chronic obstructive pulmonary disease, unspecified: Secondary | ICD-10-CM | POA: Diagnosis present

## 2018-08-18 DIAGNOSIS — D122 Benign neoplasm of ascending colon: Secondary | ICD-10-CM | POA: Diagnosis present

## 2018-08-18 DIAGNOSIS — Z9115 Patient's noncompliance with renal dialysis: Secondary | ICD-10-CM

## 2018-08-18 DIAGNOSIS — I2510 Atherosclerotic heart disease of native coronary artery without angina pectoris: Secondary | ICD-10-CM | POA: Diagnosis present

## 2018-08-18 DIAGNOSIS — Z9119 Patient's noncompliance with other medical treatment and regimen: Secondary | ICD-10-CM

## 2018-08-18 DIAGNOSIS — Z7901 Long term (current) use of anticoagulants: Secondary | ICD-10-CM

## 2018-08-18 DIAGNOSIS — Z5900 Homelessness unspecified: Secondary | ICD-10-CM

## 2018-08-18 DIAGNOSIS — D126 Benign neoplasm of colon, unspecified: Secondary | ICD-10-CM

## 2018-08-18 DIAGNOSIS — K5730 Diverticulosis of large intestine without perforation or abscess without bleeding: Secondary | ICD-10-CM | POA: Diagnosis present

## 2018-08-18 DIAGNOSIS — K921 Melena: Secondary | ICD-10-CM | POA: Diagnosis not present

## 2018-08-18 DIAGNOSIS — Z8249 Family history of ischemic heart disease and other diseases of the circulatory system: Secondary | ICD-10-CM

## 2018-08-18 DIAGNOSIS — F17210 Nicotine dependence, cigarettes, uncomplicated: Secondary | ICD-10-CM | POA: Diagnosis present

## 2018-08-18 DIAGNOSIS — M7989 Other specified soft tissue disorders: Secondary | ICD-10-CM | POA: Diagnosis not present

## 2018-08-18 DIAGNOSIS — I739 Peripheral vascular disease, unspecified: Secondary | ICD-10-CM | POA: Diagnosis present

## 2018-08-18 DIAGNOSIS — D696 Thrombocytopenia, unspecified: Secondary | ICD-10-CM | POA: Diagnosis present

## 2018-08-18 DIAGNOSIS — E875 Hyperkalemia: Principal | ICD-10-CM | POA: Diagnosis present

## 2018-08-18 DIAGNOSIS — I4581 Long QT syndrome: Secondary | ICD-10-CM | POA: Diagnosis not present

## 2018-08-18 DIAGNOSIS — N2581 Secondary hyperparathyroidism of renal origin: Secondary | ICD-10-CM | POA: Diagnosis present

## 2018-08-18 DIAGNOSIS — K648 Other hemorrhoids: Secondary | ICD-10-CM | POA: Diagnosis present

## 2018-08-18 DIAGNOSIS — I480 Paroxysmal atrial fibrillation: Secondary | ICD-10-CM | POA: Diagnosis present

## 2018-08-18 DIAGNOSIS — Z8619 Personal history of other infectious and parasitic diseases: Secondary | ICD-10-CM

## 2018-08-18 DIAGNOSIS — Z5329 Procedure and treatment not carried out because of patient's decision for other reasons: Secondary | ICD-10-CM | POA: Diagnosis present

## 2018-08-18 DIAGNOSIS — Z833 Family history of diabetes mellitus: Secondary | ICD-10-CM

## 2018-08-18 LAB — CBC
HCT: 22.4 % — ABNORMAL LOW (ref 39.0–52.0)
Hemoglobin: 6.9 g/dL — CL (ref 13.0–17.0)
MCH: 31.2 pg (ref 26.0–34.0)
MCHC: 30.8 g/dL (ref 30.0–36.0)
MCV: 101.4 fL — ABNORMAL HIGH (ref 80.0–100.0)
Platelets: 135 10*3/uL — ABNORMAL LOW (ref 150–400)
RBC: 2.21 MIL/uL — ABNORMAL LOW (ref 4.22–5.81)
RDW: 16.9 % — ABNORMAL HIGH (ref 11.5–15.5)
WBC: 3.7 10*3/uL — ABNORMAL LOW (ref 4.0–10.5)
nRBC: 0 % (ref 0.0–0.2)

## 2018-08-18 LAB — BASIC METABOLIC PANEL
Anion gap: 21 — ABNORMAL HIGH (ref 5–15)
BUN: 120 mg/dL — ABNORMAL HIGH (ref 8–23)
CO2: 20 mmol/L — AB (ref 22–32)
Calcium: 8 mg/dL — ABNORMAL LOW (ref 8.9–10.3)
Chloride: 101 mmol/L (ref 98–111)
Creatinine, Ser: 22.27 mg/dL — ABNORMAL HIGH (ref 0.61–1.24)
GFR calc Af Amer: 2 mL/min — ABNORMAL LOW (ref >60)
GFR calc non Af Amer: 2 mL/min — ABNORMAL LOW (ref >60)
GLUCOSE: 84 mg/dL (ref 70–99)
Potassium: 6.2 mmol/L — ABNORMAL HIGH (ref 3.5–5.1)
Sodium: 142 mmol/L (ref 135–145)

## 2018-08-18 LAB — IRON AND TIBC
Iron: 54 ug/dL (ref 45–182)
Saturation Ratios: 23 % (ref 17.9–39.5)
TIBC: 232 ug/dL — ABNORMAL LOW (ref 250–450)
UIBC: 178 ug/dL

## 2018-08-18 LAB — PREPARE RBC (CROSSMATCH)

## 2018-08-18 LAB — PROTIME-INR
INR: 1.27
Prothrombin Time: 15.8 seconds — ABNORMAL HIGH (ref 11.4–15.2)

## 2018-08-18 IMAGING — CR DG CHEST 2V
2 series · 2 of 2 positions shown · non-contrast
Comparison: [DATE]

CLINICAL DATA: Pt reports he last went to dialysis was [DATE]. Pt
reports he does not go because he is homeless and he is not going
through all that. Pt has bilateral lower extremity swelling.

EXAM:
CHEST - 2 VIEW

[chest pa]
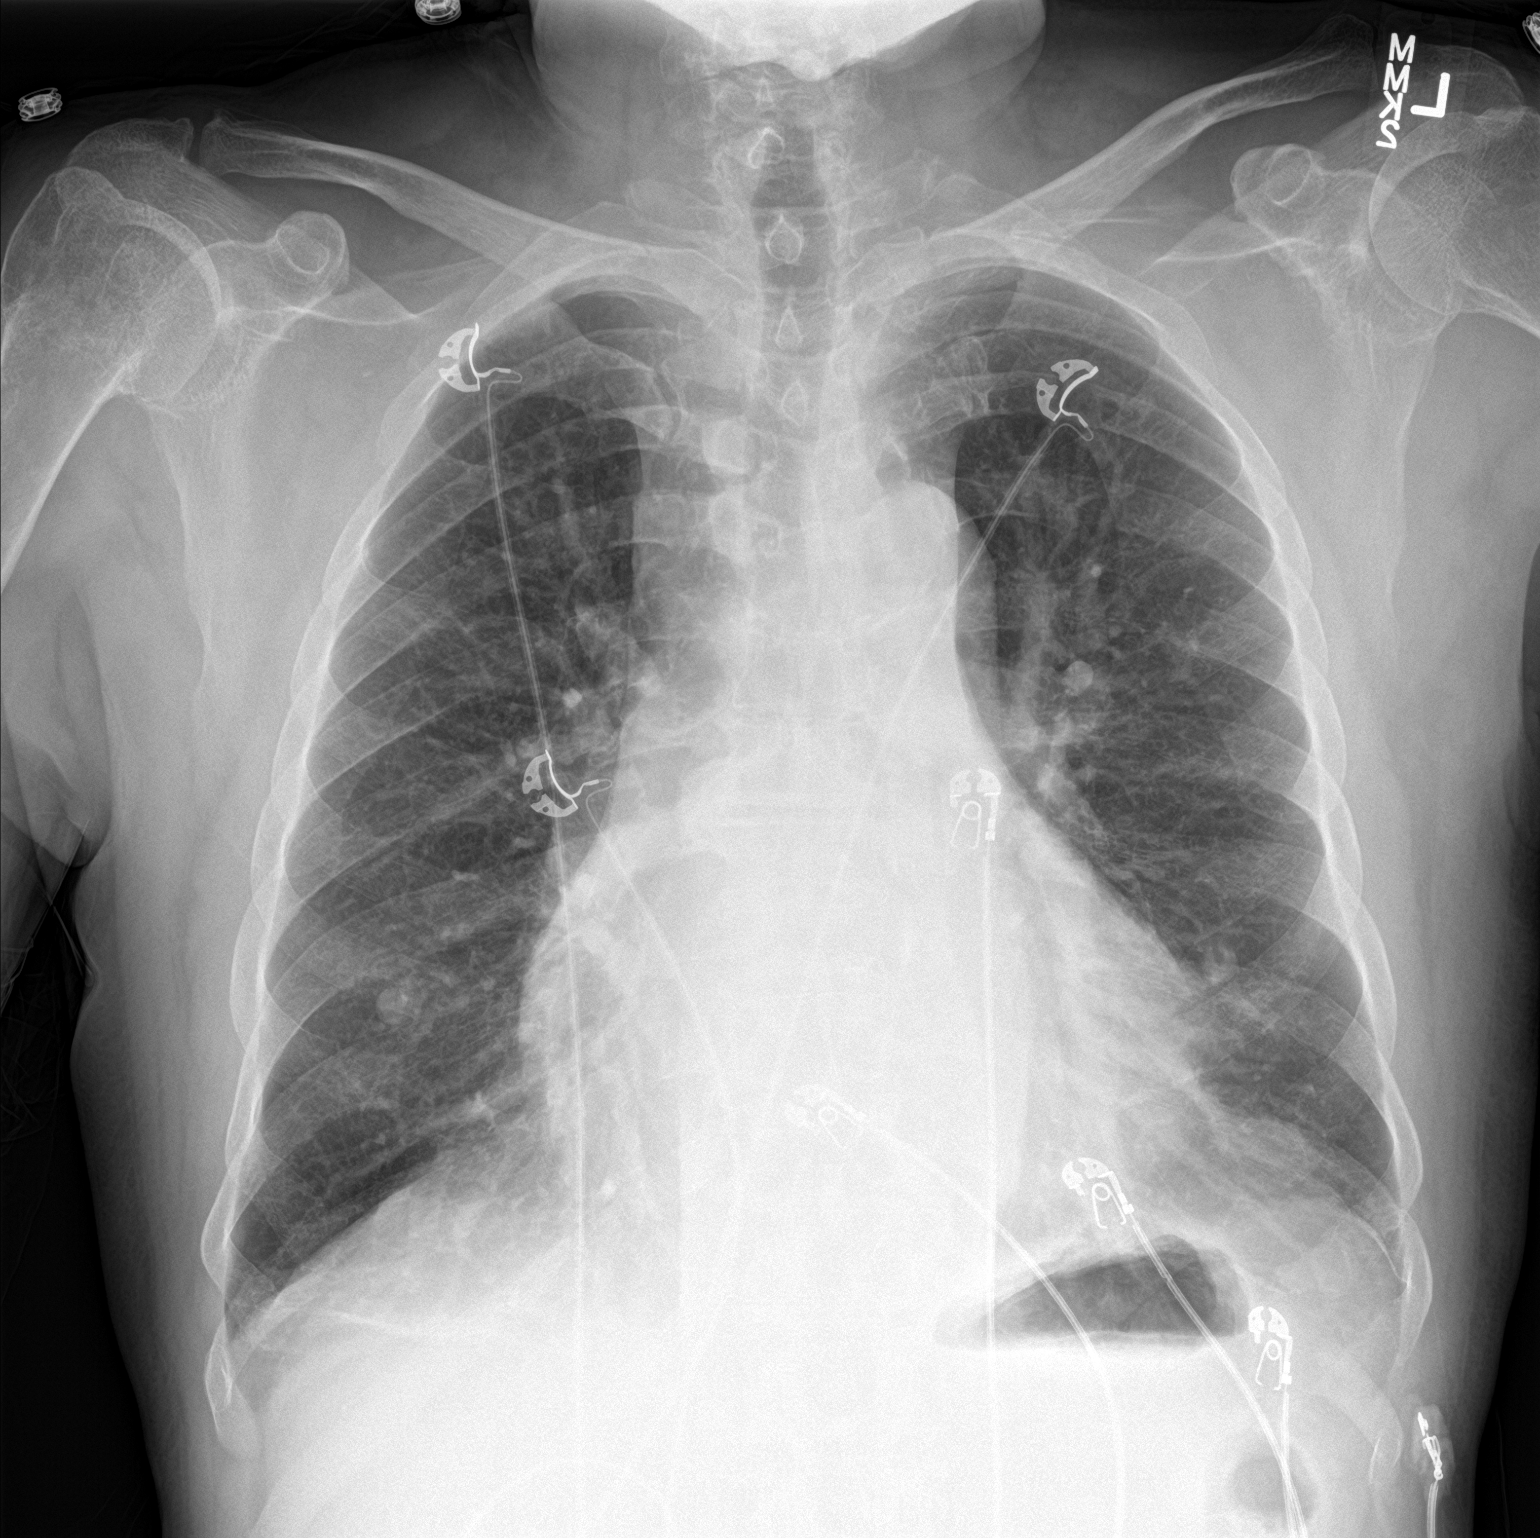

[chest lat]
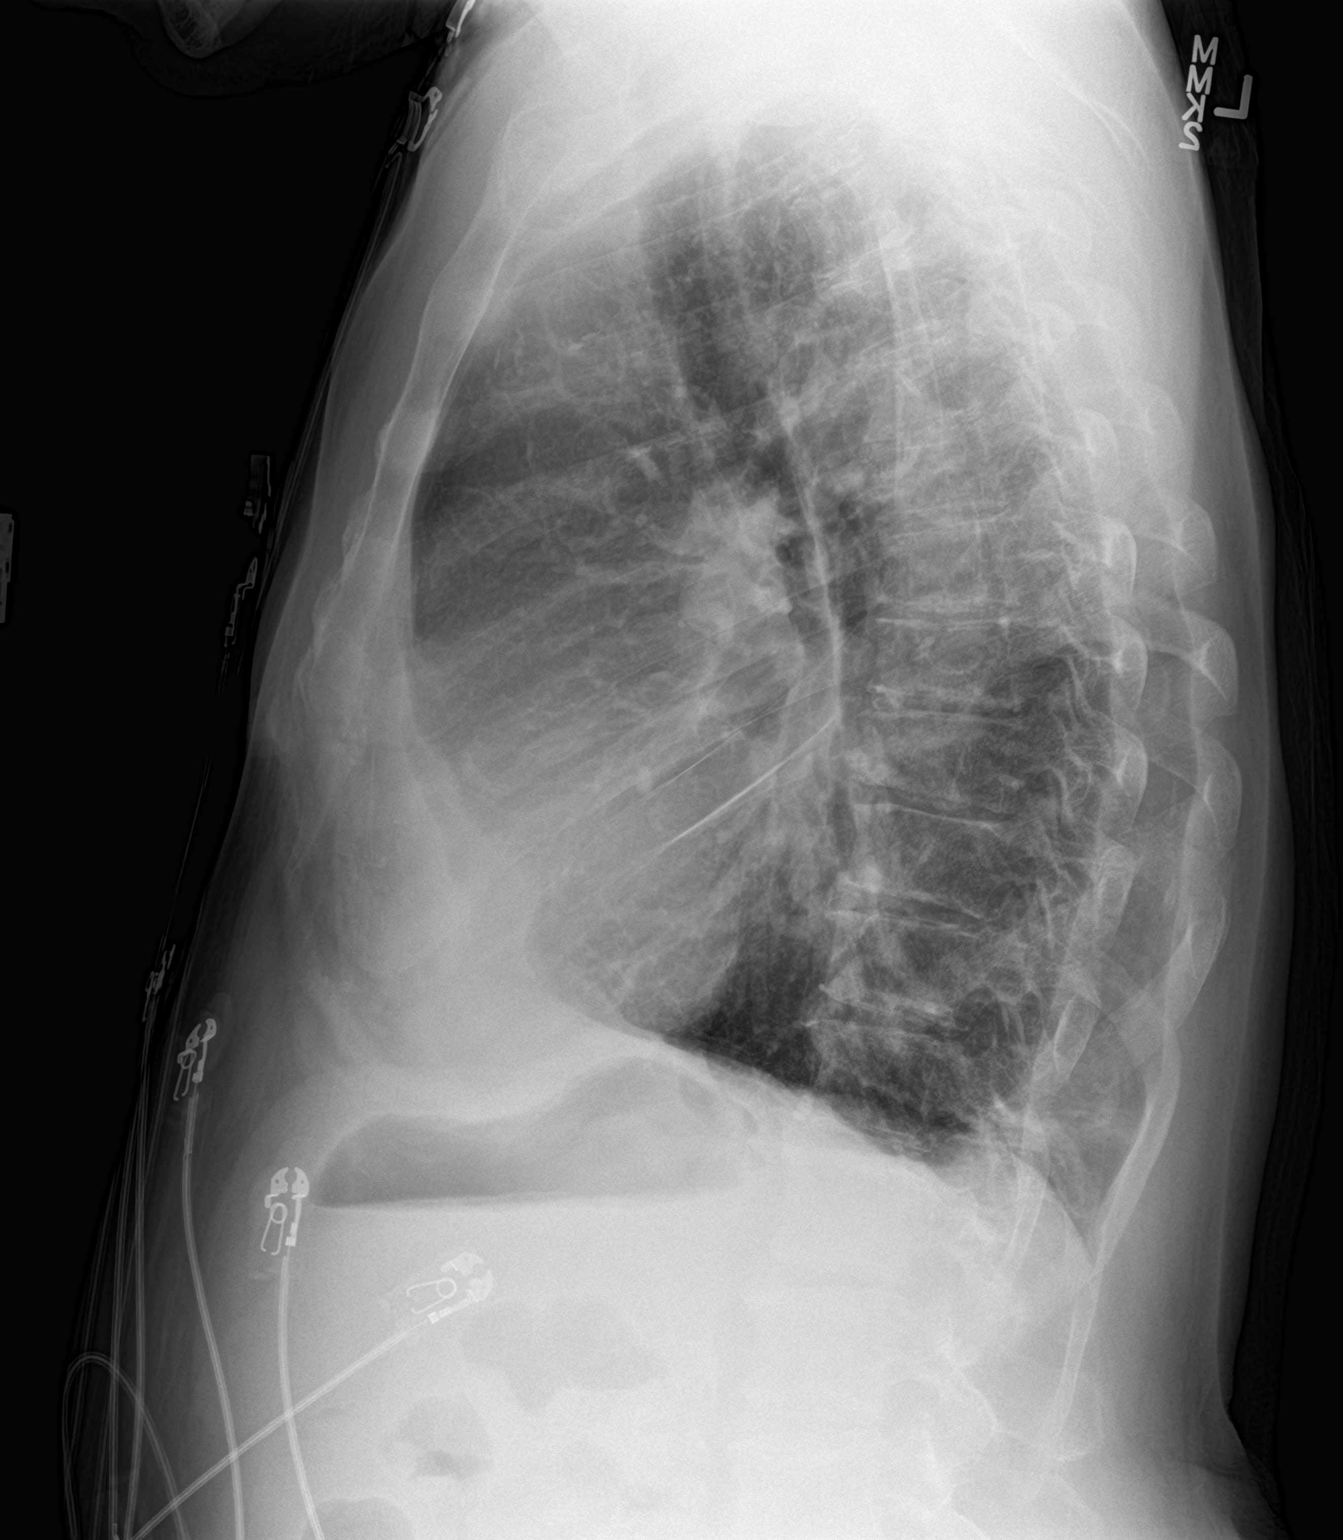

[2 of 2 positions shown; findings below may reference images not displayed]

FINDINGS: The heart is enlarged. Lungs are free of focal consolidations and
pleural effusions. No pulmonary edema.
IMPRESSION: Cardiomegaly.

## 2018-08-18 MED ORDER — METOPROLOL SUCCINATE ER 50 MG PO TB24
50.0000 mg | ORAL_TABLET | Freq: Every day | ORAL | Status: DC
  Administered 2018-08-19 – 2018-08-22 (×3): 50 mg via ORAL
  Filled 2018-08-18 (×2): qty 1
  Filled 2018-08-18: qty 2

## 2018-08-18 MED ORDER — PENTAFLUOROPROP-TETRAFLUOROETH EX AERO
1.0000 "application " | INHALATION_SPRAY | CUTANEOUS | Status: DC | PRN
  Filled 2018-08-18: qty 116

## 2018-08-18 MED ORDER — SODIUM CHLORIDE 0.9 % IV SOLN
100.0000 mL | INTRAVENOUS | Status: DC | PRN
  Administered 2018-08-20: 09:00:00 via INTRAVENOUS

## 2018-08-18 MED ORDER — HEPARIN SODIUM (PORCINE) 1000 UNIT/ML DIALYSIS
1000.0000 [IU] | INTRAMUSCULAR | Status: DC | PRN
  Filled 2018-08-18: qty 1

## 2018-08-18 MED ORDER — LIDOCAINE-PRILOCAINE 2.5-2.5 % EX CREA
1.0000 "application " | TOPICAL_CREAM | CUTANEOUS | Status: DC | PRN
  Filled 2018-08-18: qty 5

## 2018-08-18 MED ORDER — ACETAMINOPHEN 325 MG PO TABS
650.0000 mg | ORAL_TABLET | Freq: Four times a day (QID) | ORAL | Status: DC | PRN
  Administered 2018-08-21: 650 mg via ORAL
  Filled 2018-08-18: qty 2

## 2018-08-18 MED ORDER — SODIUM CHLORIDE 0.9% IV SOLUTION: Freq: Once | INTRAVENOUS | Status: DC

## 2018-08-18 MED ORDER — SODIUM CHLORIDE 0.9 % IV SOLN
1.0000 g | Freq: Once | INTRAVENOUS | Status: DC
  Filled 2018-08-18: qty 10

## 2018-08-18 MED ORDER — LIDOCAINE HCL (PF) 1 % IJ SOLN: 5.0000 mL | INTRAMUSCULAR | Status: DC | PRN

## 2018-08-18 MED ORDER — CHLORHEXIDINE GLUCONATE CLOTH 2 % EX PADS
6.0000 | MEDICATED_PAD | Freq: Every day | CUTANEOUS | Status: DC
  Administered 2018-08-20: 6 via TOPICAL

## 2018-08-18 MED ORDER — SODIUM CHLORIDE 0.9 % IV SOLN: 100.0000 mL | INTRAVENOUS | Status: DC | PRN

## 2018-08-18 MED ORDER — DARBEPOETIN ALFA 60 MCG/0.3ML IJ SOSY
60.0000 ug | PREFILLED_SYRINGE | Freq: Once | INTRAMUSCULAR | Status: DC
  Filled 2018-08-18: qty 0.3

## 2018-08-18 MED ORDER — AMIODARONE HCL 200 MG PO TABS
200.0000 mg | ORAL_TABLET | Freq: Every day | ORAL | Status: DC
  Administered 2018-08-19 – 2018-08-22 (×4): 200 mg via ORAL
  Filled 2018-08-18 (×4): qty 1

## 2018-08-18 NOTE — ED Triage Notes (Signed)
Report to Dialysis.

## 2018-08-18 NOTE — ED Provider Notes (Signed)
Presents with dyspnea and having missed dialysis since 08/11/17. He is homeless and has not been able to make it to dialysis. Nephrology consulted for dialysis.  K 6.2 but EKG without findings of hyperkalemia.  No insulin, glucose, or calcium given.  Nephrology awaiting to take patient to dialysis but does not yet have a bed request.  Hospitalist paged.  Hemoglobin 6.9 with baseline around 7.8-11.  No reported losses including GI losses.  Plan: F/U hospitalist for admission  MDM:  Unknown when patient will go to dialysis.  Has missed dialysis all week. Given 1 g IV Calcium gluconate in ED after TOC (Ca 6.7 and up to 8.3 in past 2 weeks).  Hospitalist admitted patient and he was taken to dialysis in stable condition.   Louellen Molder, MD 08/19/18 5750    Lajean Saver, MD 08/19/18 [phone]

## 2018-08-18 NOTE — Consult Note (Signed)
[facility]    NEPHROLOGY CONSULTATION NOTE  PATIENT ID:  Raymond [name], DOB:  Jan 20, 1956  HPI: The patient is a 63 y.o. year old male patient with a past medical history significant for COPD, nonobstructive CAD, peripheral arterial disease status post bypass surgery in September 2019, end-stage renal disease on hemodialysis, anemia, and paroxysmal atrial fibrillation, hepatitis C, and polysubstance abuse who presents to the emergency department with shortness of breath and hyperkalemia.  He was last dialyzed on February 1 when he was in the hospital.  He was admitted due to hyperkalemia.  Renal consultation has been called for end-stage renal disease   Past Medical History:  Diagnosis Date  . Anemia   . CAD (coronary artery disease)    a. 03/2018: cath showing 20 to 30% stenosis along the LAD, luminal irregularities along the RCA, and angiographically normal LCx  . COPD (chronic obstructive pulmonary disease) ([place])   . Dyspnea    "with too much fluid"  . ESRD (end stage renal disease) on dialysis [facility])    "MWF; Jeneen Rinks" (07/22/17)  . Hemodialysis patient ([place])   . Hepatitis C    Still positive s/p liver biopsy at [facility]  and interferon therapy for 6 months. Most recent lab work was on 10/24/12  . Hepatitis C    "took the tx; gone now" (12/05/2016)  Was treated  . History of blood transfusion ~ 2012/2013   "related to my kidneys; blood was low"  . Hypertension   . Substance abuse ([place])   . Thyroid disease     Past Surgical History:  Procedure Laterality Date  . ABDOMINAL AORTOGRAM W/LOWER EXTREMITY N/A 02/08/2018   Procedure: ABDOMINAL AORTOGRAM W/LOWER EXTREMITY;  Surgeon: Conrad Glasgow, MD;  Location: [place] [facility];  Service: Cardiovascular;  Laterality: N/A;  . AV FISTULA PLACEMENT Left Aug. 2013 ?  . FEMORAL-POPLITEAL BYPASS GRAFT Left 04/11/2018   Procedure: BYPASS GRAFT FEMORAL-POPLITEAL ARTERY LEFT LEG;  Surgeon: Rosetta Posner, MD;  Location: [place];   Service: Vascular;  Laterality: Left;  . LEFT HEART CATH AND CORONARY ANGIOGRAPHY N/A 03/29/2018   Procedure: LEFT HEART CATH AND CORONARY ANGIOGRAPHY;  Surgeon: Nelva Bush, MD;  Location: [place] [facility];  Service: Cardiovascular;  Laterality: N/A;  . LIVER BIOPSY    . ORIF ULNAR FRACTURE Left 05/18/2017   Procedure: OPEN REDUCTION INTERNAL FIXATION (ORIF) ULNAR FRACTURE;  Surgeon: Iran Planas, MD;  Location: [facility];  Service: Orthopedics;  Laterality: Left;  . SHUNTOGRAM N/A 12/11/2012   Procedure: Earney Mallet;  Surgeon: Serafina Mitchell, MD;  Location: [facility] CATH LAB;  Service: Cardiovascular;  Laterality: N/A;    Family History  Problem Relation Age of Onset  . Diabetes Father   . Hypertension Father   . Heart disease Father     Social History   Tobacco Use  . Smoking status: Current Every Day Smoker    Packs/day: 1.00    Years: 25.00    Pack years: 25.00    Types: Cigarettes    Last attempt to quit: 08/01/2011    Years since quitting: 7.0  . Smokeless tobacco: Never Used  Substance Use Topics  . Alcohol use: Yes    Comment: occasionally  . Drug use: Yes    Types: Cocaine    Comment: clean x approx 2 months (07/22/17)    REVIEW OF SYSTEMS: General:  no fatigue, no weakness Head:  no headaches Eyes:  no blurred vision ENT:  no sore throat Neck:  no masses CV:  no chest pain, no orthopnea Lungs:  no shortness of breath, no cough GI:  no nausea or vomiting, no diarrhea GU:  no dysuria or hematuria Skin:  no rashes or lesions Neuro:  no focal numbness or weakness Psych:  no depression or anxiety    PHYSICAL EXAM:  Vitals:   08/18/18 2030 08/18/18 2051  BP: 120/61 121/67  Pulse: 84 84  Resp: 16 18  Temp:  98.4 F (36.9 C)  SpO2:  98%   I/O last 3 completed shifts: In: 10 [Blood:315] Out: -    General:  AAOx3 NAD HEENT: MMM [place] AT anicteric sclera Neck:  No JVD, no adenopathy CV:  Heart RRR  Lungs:  L/S CTA bilaterally Abd:  abd SNT/ND with normal  BS GU:  Bladder non-palpable Extremities: +1 bilateral lower extremity edema Skin:  No skin rash Psych:  normal mood and affect Neuro:  no focal deficits  MEDICATIONS:  No outpatient medications have been marked as taking for the 08/18/18 encounter [facility]     . sodium chloride   Intravenous Once  . sodium chloride   Intravenous Once  . amiodarone  200 mg Oral Daily  . [START ON 08/19/2018] Chlorhexidine Gluconate Cloth  6 each Topical Q0600  . metoprolol succinate  50 mg Oral Daily       LABS:  CBC Latest Ref Rng & Units 08/18/2018 08/15/2018 08/13/2018  WBC 4.0 - 10.5 K/uL 3.7(L) 2.2(L) 3.0(L)  Hemoglobin 13.0 - 17.0 g/dL 6.9(LL) 7.8(L) 9.1(L)  Hematocrit 39.0 - 52.0 % 22.4(L) 26.0(L) 29.5(L)  Platelets 150 - 400 K/uL 135(L) 96(L) 99(L)    CMP Latest Ref Rng & Units 08/18/2018 08/15/2018 08/12/2018  Glucose 70 - 99 mg/dL 84 74 84  BUN 8 - 23 mg/dL 120(H) 64(H) 25(H)  Creatinine 0.61 - 1.24 mg/dL 22.27(H) 16.04(H) 8.11(H)  Sodium 135 - 145 mmol/L 142 136 136  Potassium 3.5 - 5.1 mmol/L 6.2(H) 4.9 3.6  Chloride 98 - 111 mmol/L 101 97(L) 99  CO2 22 - 32 mmol/L 20(L) 23 25  Calcium 8.9 - 10.3 mg/dL 8.0(L) 7.5(L) 7.2(L)  Total Protein 6.5 - 8.1 g/dL - - -  Total Bilirubin 0.3 - 1.2 mg/dL - - -  Alkaline Phos 38 - 126 U/L - - -  AST 15 - 41 U/L - - -  ALT 0 - 44 U/L - - -    Lab Results  Component Value Date   CALCIUM 8.0 (L) 08/18/2018   CAION 0.93 (L) 08/01/2018   PHOS 2.3 (L) 08/12/2018       Component Value Date/Time   COLORURINE YELLOW 05/08/2010 1335   APPEARANCEUR CLEAR 05/08/2010 1335   LABSPEC 1.009 05/08/2010 1335   PHURINE 6.0 05/08/2010 1335   GLUCOSEU NEGATIVE 05/08/2010 1335   HGBUR NEGATIVE 05/08/2010 [place] 05/08/2010 1335   KETONESUR NEGATIVE 05/08/2010 1335   PROTEINUR 100 (A) 05/08/2010 1335   UROBILINOGEN 0.2 05/08/2010 1335   NITRITE NEGATIVE 05/08/2010 1335   LEUKOCYTESUR SMALL (A) 05/08/2010 1335      Component  Value Date/Time   HCO3 18.8 (L) 08/01/2018 2238   TCO2 20 (L) 08/01/2018 2238   ACIDBASEDEF 8.0 (H) 08/01/2018 2238   O2SAT 72.0 08/01/2018 2238       Component Value Date/Time   IRON 54 08/18/2018 1728   TIBC 232 (L) 08/18/2018 1728   FERRITIN 423 (H) 04/20/2018 0924   IRONPCTSAT 23 08/18/2018 1728       ASSESSMENT/PLAN:  Problem List Items Addressed This Visit      Other   Hyperkalemia    Other Visit Diagnoses    ESRD (end stage renal disease) on dialysis ([place])    -  Primary   Anemia due to chronic kidney disease, on chronic dialysis ([facility])       Non-compliance with renal dialysis ([place])          1.  End-stage renal disease on hemodialysis.  Receiving urgent dialysis today.  Will resume Monday, Wednesday, Friday dialysis schedule.  Receives his dialysis at the [facility]  2.  Anemia.  He is receiving a transfusion today.  Will begin darbepoetin.  3.  Hyperkalemia.  Likely secondary to missed dialysis.  Receiving a dialysis treatment tonight.  4.  Hepatitis C.  He is status post interferon therapy for 6 months.  5.  Noncompliance with dialysis.  Needs social work follow-up.    [place], DO, [place]

## 2018-08-18 NOTE — H&P (Signed)
History and Physical  Raymond [name] YCX:[id number] DOB: 1956-03-02 DOA: 08/18/2018  Referring physician: EDP PCP: Scot Jun, FNP   Chief Complaint: homeless, missing dialysis, sob  HPI: Raymond [name] is a 63 y.o. male   H/o COPD, mild nonobstructive CAD by cath in 03/2018, h/o PAD s/p bypass surgery in 03/2018, ESRD on HD (non compliant with HD), anemia, paroxysmal afib does not appear to taking coumadin consistently ( as per chart review, his INR mostly are subtherapeutic), HTN,  polysubstance use and intermittent alcohol binging, treated hepatitis C who presented to the ED complaining short of breath he is a very poor historian not able to get reliable history.  He reports last dialysis was August 11, 2018 which was when she was in the hospital.   ED course: He has no hypoxia at rest, denies chest pain , blood pressure is elevated, he is in sinus rhythm.  Labs showed potassium 6.2, hgb 6.9, checks x-ray no acute findings, EKG with sinus rhythm no acute ST-T changes.  EDP ordered one dose of calcium gluconate, EDP called nephrologist who is going to do urgent dialysis, hospitalist called as well.  Patient denies of nausea vomiting, denies blood in the stool.  Denies ab pain. He states he has not had a bowel movement for a while, but not able to give details.  He does report drinking alcohol, last drink was yesterday, he denies history of alcohol withdrawal.  Review of Systems:  Detail per HPI, Review of systems are otherwise negative  Past Medical History:  Diagnosis Date  . Anemia   . CAD (coronary artery disease)    a. 03/2018: cath showing 20 to 30% stenosis along the LAD, luminal irregularities along the RCA, and angiographically normal LCx  . COPD (chronic obstructive pulmonary disease) ([place])   . Dyspnea    "with too much fluid"  . ESRD (end stage renal disease) on dialysis [facility])    "MWF; Jene[facility] Rinks" (07/22/17)  . Hemodialysis patient ([place])   . Hepatitis C    Still  positive s/p liver biopsy at [facility]  and interferon therapy for 6 months. Most recent lab work was on 10/24/12  . Hepatitis C    "took the tx; gone now" (12/05/2016)  Was treated  . History of blood transfusion ~ 2012/2013   "related to my kidneys; blood was low"  . Hypertension   . Substance abuse ([place])   . Thyroid disease    Past Surgical History:  Procedure Laterality Date  . ABDOMINAL AORTOGRAM W/LOWER EXTREMITY N/A 02/08/2018   Procedure: ABDOMINAL AORTOGRAM W/LOWER EXTREMITY;  Surgeon: Conrad Clayton, MD;  Location: [place] [facility];  Service: Cardiovascular;  Laterality: N/A;  . AV FISTULA PLACEMENT Left Aug. 2013 ?  . FEMORAL-POPLITEAL BYPASS GRAFT Left 04/11/2018   Procedure: BYPASS GRAFT FEMORAL-POPLITEAL ARTERY LEFT LEG;  Surgeon: Rosetta Posner, MD;  Location: [place];  Service: Vascular;  Laterality: Left;  . LEFT HEART CATH AND CORONARY ANGIOGRAPHY N/A 03/29/2018   Procedure: LEFT HEART CATH AND CORONARY ANGIOGRAPHY;  Surgeon: Nelva Bush, MD;  Location: [place] [facility];  Service: Cardiovascular;  Laterality: N/A;  . LIVER BIOPSY    . ORIF ULNAR FRACTURE Left 05/18/2017   Procedure: OPEN REDUCTION INTERNAL FIXATION (ORIF) ULNAR FRACTURE;  Surgeon: Iran Planas, MD;  Location: [place];  Service: Orthopedics;  Laterality: Left;  . SHUNTOGRAM N/A 12/11/2012   Procedure: Earney Mallet;  Surgeon: Serafina Mitchell, MD;  Location: [facility] CATH LAB;  Service: Cardiovascular;  Laterality: N/A;   Social History:  reports that he has been smoking cigarettes. He has a 25.00 pack-year smoking history. He has never used smokeless tobacco. He reports current alcohol use. He reports current drug use. Drug: Cocaine. Patient is homeless & is able to participate in activities of daily living independently   No Known Allergies  Family History  Problem Relation Age of Onset  . Diabetes Father   . Hypertension Father   . Heart disease Father       Prior to Admission medications   Medication Sig Start  Date End Date Taking? Authorizing Provider  acetaminophen (TYLENOL) 325 MG tablet Take 650 mg by mouth every 6 (six) hours as needed for mild pain.    [provider]  amiodarone (PACERONE) 200 MG tablet Take 1 tablet (200 mg total) by mouth daily. 08/13/18   Cherene Altes, MD  amLODipine (NORVASC) 2.5 MG tablet Take 2.5 mg by mouth daily.    [provider]  metoprolol succinate (TOPROL-XL) 50 MG 24 hr tablet Take 50 mg by mouth daily. Take with or immediately following a meal.    [provider]  ondansetron (ZOFRAN) 4 MG tablet Take 1 tablet (4 mg total) by mouth every 8 (eight) hours as needed for nausea or vomiting. Patient not taking: Reported on 08/18/2018 04/03/18   Thurnell Lose, MD  warfarin (COUMADIN) 5 MG tablet Take 1 tablet (5 mg total) by mouth daily at 6 PM. 08/13/18   Cherene Altes, MD    Physical Exam: BP 133/76   Pulse 86   Temp 98.5 F (36.9 C) (Oral)   Resp 16   Ht 5\' 8"  (1.727 m)   SpO2 94%   BMI 26.46 kg/m   General:  Alert, oriented but very poor historian, unkempt  Eyes: PERRL ENT: unremarkable Neck: supple, no JVD Cardiovascular: RRR Respiratory: CTABL Abdomen: soft/NT/ND, positive bowel sounds Skin: no rash Musculoskeletal:  Bilateral lower extremity pitting edema Psychiatric: calm, flat affect Neurologic: no focal findings            Labs on Admission:  Basic Metabolic Panel: Recent Labs  Lab 08/12/18 0358 08/15/18 0440 08/18/18 1501  NA 136 136 142  K 3.6 4.9 6.2*  CL 99 97* 101  CO2 25 23 20*  GLUCOSE 84 74 84  BUN 25* 64* 120*  CREATININE 8.11* 16.04* 22.27*  CALCIUM 7.2* 7.5* 8.0*  PHOS 2.3*  --   --    Liver Function Tests: Recent Labs  Lab 08/12/18 0358  ALBUMIN 2.6*   No results for input(s): LIPASE, AMYLASE in the last 168 hours. No results for input(s): AMMONIA in the last 168 hours. CBC: Recent Labs  Lab 08/12/18 0358 08/13/18 0411 08/15/18 0440 08/18/18 1501  WBC 2.7* 3.0* 2.2*  3.7*  HGB 8.6* 9.1* 7.8* 6.9*  HCT 27.2* 29.5* 26.0* 22.4*  MCV 98.9 99.7 100.0 101.4*  PLT 107* 99* 96* 135*   Cardiac Enzymes: No results for input(s): CKTOTAL, CKMB, CKMBINDEX, TROPONINI in the last 168 hours.  BNP (last 3 results) No results for input(s): BNP in the last 8760 hours.  ProBNP (last 3 results) No results for input(s): PROBNP in the last 8760 hours.  CBG: No results for input(s): GLUCAP in the last 168 hours.  Radiological Exams on Admission: Dg Chest 2 View  Result Date: 08/18/2018 CLINICAL DATA:  Pt reports he last went to dialysis was 08-11-2018. Pt reports he does not go because he is homeless and he  is not going through all that. Pt has bilateral lower extremity swelling. EXAM: CHEST - 2 VIEW COMPARISON:  08/15/2018 FINDINGS: The heart is enlarged. Lungs are free of focal consolidations and pleural effusions. No pulmonary edema. IMPRESSION: Cardiomegaly. Electronically Signed   By: Nolon Nations M.D.   On: 08/18/2018 15:45    EKG: Independently reviewed. Sinus rhtym  Assessment/Plan Present on Admission: **None**   ESRD missing HD With hyperkalemia on presentation Urgent HD Plan per nephrology  Anemia Acute on chronic , no obvious sign of bleeding Could be due to hemodilution Will get FOBT, iron panel PRBC transfusion during dialysis per nephrology  PAF Currently in sinus rhythm Suppose to be on betablocker, amiodarone and coumadin Not sure is compliant, will check INR  PAD s/p bypass ( Left femoral to below-knee popliteal bypass with reverse great saphenous vein) in 03/2018 Suppose to be on asa 81 and statin per vascular surgery d/c summary in 03/2018  COPD, not on home o2, no hypoxia, no wheezing Nebs prn  Aortic aneurysm CTa noted ascending thoracic aortic aneurysm measuring 4.4 cm at the level of the main pulmonary artery, which was felt to be unchanged since a CT Dec 2018 - will need follow-up annual imaging by CTA or MRA as outpt  H/o  polysubstance abuse He does not make urine Smoker Alcohol use: denies h/o alcohol withdrawal  Homeless Education officer, museum consult  DVT prophylaxis: scd's  Consultants: nephrology  Code Status: full   Family Communication:  Patient   Disposition Plan: med tele obs  Time spent: 82mins  Florencia Reasons MD, PhD [facility] Pager [phone] If 7PM-7AM, please contact night-coverage at [email or web address], password [facility]

## 2018-08-18 NOTE — ED Triage Notes (Signed)
Pt did not go to dialysis today because he is homeless. Pt mumbles hard to understand what he says.

## 2018-08-18 NOTE — ED Notes (Signed)
TC 2 1M to give report to RN that will receive Pt after Dialysis.

## 2018-08-18 NOTE — ED Triage Notes (Signed)
PT reports he last went to dialysis  Was 08-11-2018. Pt reports he does not go because he is homeless and he is not going thur all that. Pt reports I know there is more they can do for me.

## 2018-08-18 NOTE — ED Provider Notes (Signed)
[facility] EMERGENCY DEPARTMENT Provider Note   CSN: [id number] Arrival date & time: 08/18/18  1426     History   Chief Complaint Chief Complaint  Patient presents with  . Shortness of Breath    HPI Raymond [name] is a 63 y.o. male.with a history of COPD, CAD, HTN, substance abuse, PAF on coumadin, and ESRD on dialysis T/Th/Sat with hx of noncompliance who presents to the ED with complaint of dyspnea.  Patient states that his current dialysis regimen is Tuesday, Thursday, Saturday.  He states that he is unsure which day exactly he was most recently dialyzed, he states it was the last time that he was admitted to the hospital- appears this would have been 02/01.  He states he has missed dialysis since discharge because he has been unable to get there.  He states that he dialyzes at the [facility] on [address].  He notes that since missing dialysis he has had some dyspnea, worse with exertion, as well as some lower extremity edema.  No other specific alleviating or aggravating factors.  No fever, chills, chest pain, syncope, abdominal pain, nausea, vomiting, or diarrhea.   HPI  Past Medical History:  Diagnosis Date  . Anemia   . CAD (coronary artery disease)    a. 03/2018: cath showing 20 to 30% stenosis along the LAD, luminal irregularities along the RCA, and angiographically normal LCx  . COPD (chronic obstructive pulmonary disease) ([place])   . Dyspnea    "with too much fluid"  . ESRD (end stage renal disease) on dialysis [facility])    "MWF; Jeneen Rinks" (07/22/17)  . Hemodialysis patient ([place])   . Hepatitis C    Still positive s/p liver biopsy at [facility]  and interferon therapy for 6 months. Most recent lab work was on 10/24/12  . Hepatitis C    "took the tx; gone now" (12/05/2016)  Was treated  . History of blood transfusion ~ 2012/2013   "related to my kidneys; blood was low"  . Hypertension   . Substance abuse ([place])   . Thyroid disease      Patient Active Problem List   Diagnosis Date Noted  . Atrial flutter with rapid ventricular response ([place]) 08/10/2018  . PAF (paroxysmal atrial fibrillation) ([place]) 08/01/2018  . Acute respiratory failure with hypoxia ([place]) 07/13/2018  . Noncompliance of patient with renal dialysis ([place]) 07/13/2018  . PAD (peripheral artery disease) ([place]) 04/11/2018  . Sepsis ([place]) 03/31/2018  . Preop cardiovascular exam 03/29/2018  . Volume overload 08/07/2017  . Acute pulmonary edema (HCC)   . Dyspnea 06/20/2017  . Acute bronchitis   . COPD (chronic obstructive pulmonary disease) ([place])   . End-stage renal disease on hemodialysis ([place])   . Hypoxia 12/05/2016  . Hyperkalemia 12/19/2015  . Hypoglycemia 12/19/2015  . Polysubstance abuse ([place]) 12/19/2015  . Homelessness 12/19/2015  . Anemia associated with stage 5 chronic renal failure ([place]) 12/19/2015  . Atypical chest pain 12/19/2013  . Atherosclerosis of native arteries of extremity with intermittent claudication ([place]) 12/04/2012  . ESRD (end stage renal disease) ([place]) 12/04/2012  . HEPATITIS C 09/07/2006  . HYPERCHOLESTEROLEMIA 09/07/2006  . HYPERTENSION, BENIGN SYSTEMIC 09/07/2006    Past Surgical History:  Procedure Laterality Date  . ABDOMINAL AORTOGRAM W/LOWER EXTREMITY N/A 02/08/2018   Procedure: ABDOMINAL AORTOGRAM W/LOWER EXTREMITY;  Surgeon: Conrad Hurst, MD;  Location: [facility] CV LAB;  Service: Cardiovascular;  Laterality: N/A;  . AV FISTULA PLACEMENT Left Aug. 2013 ?  [name]  FEMORAL-POPLITEAL BYPASS GRAFT Left 04/11/2018   Procedure: BYPASS GRAFT FEMORAL-POPLITEAL ARTERY LEFT LEG;  Surgeon: Rosetta Posner, MD;  Location: [facility];  Service: Vascular;  Laterality: Left;  . LEFT HEART CATH AND CORONARY ANGIOGRAPHY N/A 03/29/2018   Procedure: LEFT HEART CATH AND CORONARY ANGIOGRAPHY;  Surgeon: Nelva Bush, MD;  Location: [place] CV LAB;  Service: Cardiovascular;  Laterality: N/A;  . LIVER BIOPSY    . ORIF ULNAR FRACTURE Left  05/18/2017   Procedure: OPEN REDUCTION INTERNAL FIXATION (ORIF) ULNAR FRACTURE;  Surgeon: Iran Planas, MD;  Location: [facility];  Service: Orthopedics;  Laterality: Left;  . SHUNTOGRAM N/A 12/11/2012   Procedure: Earney Mallet;  Surgeon: Serafina Mitchell, MD;  Location: [facility] CATH LAB;  Service: Cardiovascular;  Laterality: N/A;        Home Medications    Prior to Admission medications   Medication Sig Start Date End Date Taking? Authorizing Provider  acetaminophen (TYLENOL) 325 MG tablet Take 650 mg by mouth every 6 (six) hours as needed for mild pain.    [provider]  amiodarone (PACERONE) 200 MG tablet Take 1 tablet (200 mg total) by mouth daily. 08/13/18   Cherene Altes, MD  amLODipine (NORVASC) 2.5 MG tablet Take 2.5 mg by mouth daily.    [provider]  metoprolol succinate (TOPROL-XL) 50 MG 24 hr tablet Take 50 mg by mouth daily. Take with or immediately following a meal.    [provider]  ondansetron (ZOFRAN) 4 MG tablet Take 1 tablet (4 mg total) by mouth every 8 (eight) hours as needed for nausea or vomiting. Patient not taking: Reported on 08/18/2018 04/03/18   Thurnell Lose, MD  warfarin (COUMADIN) 5 MG tablet Take 1 tablet (5 mg total) by mouth daily at 6 PM. 08/13/18   Cherene Altes, MD    Family History Family History  Problem Relation Age of Onset  . Diabetes Father   . Hypertension Father   . Heart disease Father     Social History Social History   Tobacco Use  . Smoking status: Current Every Day Smoker    Packs/day: 1.00    Years: 25.00    Pack years: 25.00    Types: Cigarettes    Last attempt to quit: 08/01/2011    Years since quitting: 7.0  . Smokeless tobacco: Never Used  Substance Use Topics  . Alcohol use: Yes    Comment: occasionally  . Drug use: Yes    Types: Cocaine    Comment: clean x approx 2 months (07/22/17)     Allergies   Patient has no known allergies.   Review of Systems Review of  Systems Constitutional: Negative for chills and fever.  Respiratory: Positive for shortness of breath. Negative for cough.   Cardiovascular: Positive for leg swelling. Negative for chest pain.  Gastrointestinal: Negative for abdominal pain, diarrhea, nausea and vomiting.  Neurological: Negative for syncope and numbness.  All other systems reviewed and are negative.   Physical Exam Updated Vital Signs BP (!) 168/99 (BP Location: Right Arm)   Pulse 87   Temp 98.5 F (36.9 C) (Oral)   Resp 20   Ht 5\' 8"  (1.727 m)   SpO2 98%   BMI 26.46 kg/m   Physical Exam Vitals signs and nursing note reviewed.  Constitutional:      General: He is not in acute distress.    Appearance: He is well-developed. He is not toxic-appearing.  HENT:     Head: Normocephalic  and atraumatic.  Eyes:     General:        Right eye: No discharge.        Left eye: No discharge.     Conjunctiva/sclera: Conjunctivae normal.  Neck:     Musculoskeletal: Neck supple.  Cardiovascular:     Rate and Rhythm: Normal rate and regular rhythm.  Pulmonary:     Effort: Pulmonary effort is normal. No respiratory distress.     Breath sounds: Decreased breath sounds (bibasilar) present. No wheezing, rhonchi or rales.  Abdominal:     General: There is no distension.     Palpations: Abdomen is soft.     Tenderness: There is no abdominal tenderness.  Musculoskeletal:     Comments: Left upper extremity, AV fistula in place with palpable thrill. 1+ pitting edema to the lower legs that is symmetric in appearance.  No overlying erythema or warmth.  Skin:    General: Skin is warm and dry.     Findings: No rash.  Neurological:     Mental Status: He is alert.     Comments: Clear speech.   Psychiatric:        Behavior: Behavior normal.    ED Treatments / Results  Labs (all labs ordered are listed, but only abnormal results are displayed) Labs Reviewed  BASIC METABOLIC PANEL - Abnormal; Notable for the following  components:      Result Value   Potassium 6.2 (*)    CO2 20 (*)    BUN 120 (*)    Creatinine, Ser 22.27 (*)    Calcium 8.0 (*)    GFR calc non Af Amer 2 (*)    GFR calc Af Amer 2 (*)    Anion gap 21 (*)    All other components within normal limits  CBC - Abnormal; Notable for the following components:   WBC 3.7 (*)    RBC 2.21 (*)    Hemoglobin 6.9 (*)    HCT 22.4 (*)    MCV 101.4 (*)    RDW 16.9 (*)    Platelets 135 (*)    All other components within normal limits    EKG EKG Interpretation  Date/Time:  Saturday August 18 2018 14:46:30 EST Ventricular Rate:  86 PR Interval:    QRS Duration: 91 QT Interval:  412 QTC Calculation: 493 R Axis:   65 Text Interpretation:  Sinus rhythm Prolonged PR interval Nonspecific T abnormalities, lateral leads Borderline prolonged QT interval No significant change since last tracing Confirmed by Dorie Rank [phone]) on 08/18/2018 2:52:46 PM   Radiology Dg Chest 2 View  Result Date: 08/18/2018 CLINICAL DATA:  Pt reports he last went to dialysis was 08-11-2018. Pt reports he does not go because he is homeless and he is not going through all that. Pt has bilateral lower extremity swelling. EXAM: CHEST - 2 VIEW COMPARISON:  08/15/2018 FINDINGS: The heart is enlarged. Lungs are free of focal consolidations and pleural effusions. No pulmonary edema. IMPRESSION: Cardiomegaly. Electronically Signed   By: Nolon Nations M.D.   On: 08/18/2018 15:45    Procedures Procedures (including critical care time)  Medications Ordered in ED Medications - No data to display   Initial Impression / Assessment and Plan / ED Course  I have reviewed the triage vital signs and the nursing notes.  Pertinent labs & imaging results that were available during my care of the patient were reviewed by me and considered in my medical decision making (see chart for  details).    Patient presents w/ dyspnea & lower extremity edema after missing dialysis x 3 including  today. Last fully dialyzed 02/01. Nontoxic appearing, in no apparent distress, vitals w/ BP elevated. Exam with 1+ symmetric pitting edema and some bibasilar decreased breath sounds, does not appear to be in respiratory distress. Further evaluate with basic labs, EKG & CXR. Anticipate consultation to nephrology.   CBC: Anemic w/ hgb of 6.9 & hct of 22.4 decreased from prior 7.8 & 26.0 respectively. Leukopenia @ 3.7 is improved.  BMP: Hyperkalemic @ 6.2, creatinine elevated at 22.27, hyopcalcemic at 8.0. elevated anion gap likely secondary to uremia.  EKG: No significant change from prior, non specific T wave changes in lateral leads, borderline QT CXR: Cardiomegaly  16:04: CONSULT: Discussed case with nephrologist Dr. Grayland Ormond- will evaluate patient.   Nephrology requesting hospitalist admission.  Patient signed out to Dr. Etheleen Mayhew pending call back from hospitalist service w/ plan for admission.   Final Clinical Impressions(s) / ED Diagnoses   Final diagnoses:  ESRD (end stage renal disease) on dialysis ([place])  Hyperkalemia  Anemia due to chronic kidney disease, on chronic dialysis [facility])    ED Discharge Orders    None       Amaryllis Dyke, PA-C 08/18/18 1650    Dorie Rank, MD 08/19/18 [phone]

## 2018-08-19 DIAGNOSIS — D649 Anemia, unspecified: Secondary | ICD-10-CM

## 2018-08-19 DIAGNOSIS — Z9115 Patient's noncompliance with renal dialysis: Secondary | ICD-10-CM

## 2018-08-19 DIAGNOSIS — N186 End stage renal disease: Secondary | ICD-10-CM

## 2018-08-19 DIAGNOSIS — Z992 Dependence on renal dialysis: Secondary | ICD-10-CM | POA: Diagnosis not present

## 2018-08-19 DIAGNOSIS — E875 Hyperkalemia: Secondary | ICD-10-CM | POA: Diagnosis not present

## 2018-08-19 DIAGNOSIS — D631 Anemia in chronic kidney disease: Secondary | ICD-10-CM

## 2018-08-19 DIAGNOSIS — F1920 Other psychoactive substance dependence, uncomplicated: Secondary | ICD-10-CM

## 2018-08-19 DIAGNOSIS — Z590 Homelessness: Secondary | ICD-10-CM

## 2018-08-19 DIAGNOSIS — I480 Paroxysmal atrial fibrillation: Secondary | ICD-10-CM

## 2018-08-19 DIAGNOSIS — Z9114 Patient's other noncompliance with medication regimen: Secondary | ICD-10-CM

## 2018-08-19 LAB — CBC WITH DIFFERENTIAL/PLATELET
Abs Immature Granulocytes: 0.01 10*3/uL (ref 0.00–0.07)
Basophils Absolute: 0 10*3/uL (ref 0.0–0.1)
Basophils Relative: 0 %
Eosinophils Absolute: 0.1 10*3/uL (ref 0.0–0.5)
Eosinophils Relative: 1 %
HCT: 23.9 % — ABNORMAL LOW (ref 39.0–52.0)
Hemoglobin: 7.6 g/dL — ABNORMAL LOW (ref 13.0–17.0)
Immature Granulocytes: 0 %
Lymphocytes Relative: 20 %
Lymphs Abs: 0.8 10*3/uL (ref 0.7–4.0)
MCH: 29.9 pg (ref 26.0–34.0)
MCHC: 31.8 g/dL (ref 30.0–36.0)
MCV: 94.1 fL (ref 80.0–100.0)
Monocytes Absolute: 0.7 10*3/uL (ref 0.1–1.0)
Monocytes Relative: 18 %
Neutro Abs: 2.3 10*3/uL (ref 1.7–7.7)
Neutrophils Relative %: 61 %
Platelets: 133 10*3/uL — ABNORMAL LOW (ref 150–400)
RBC: 2.54 MIL/uL — AB (ref 4.22–5.81)
RDW: 19.4 % — ABNORMAL HIGH (ref 11.5–15.5)
WBC: 3.9 10*3/uL — ABNORMAL LOW (ref 4.0–10.5)
nRBC: 0 % (ref 0.0–0.2)

## 2018-08-19 LAB — TYPE AND SCREEN
ABO/RH(D): B POS
ANTIBODY SCREEN: NEGATIVE
Unit division: 0

## 2018-08-19 LAB — BASIC METABOLIC PANEL
Anion gap: 15 (ref 5–15)
BUN: 47 mg/dL — ABNORMAL HIGH (ref 8–23)
CO2: 25 mmol/L (ref 22–32)
Calcium: 7.7 mg/dL — ABNORMAL LOW (ref 8.9–10.3)
Chloride: 98 mmol/L (ref 98–111)
Creatinine, Ser: 11.44 mg/dL — ABNORMAL HIGH (ref 0.61–1.24)
GFR calc non Af Amer: 4 mL/min — ABNORMAL LOW (ref >60)
GFR, EST AFRICAN AMERICAN: 5 mL/min — AB (ref >60)
Glucose, Bld: 80 mg/dL (ref 70–99)
Potassium: 4.4 mmol/L (ref 3.5–5.1)
SODIUM: 138 mmol/L (ref 135–145)

## 2018-08-19 LAB — VITAMIN B12: Vitamin B-12: 509 pg/mL (ref 180–914)

## 2018-08-19 LAB — BPAM RBC
Blood Product Expiration Date: 202002252359
ISSUE DATE / TIME: 202002081842
Unit Type and Rh: 7300

## 2018-08-19 LAB — OCCULT BLOOD X 1 CARD TO LAB, STOOL: Fecal Occult Bld: POSITIVE — AB

## 2018-08-19 LAB — PROTIME-INR
INR: 1.23
Prothrombin Time: 15.4 seconds — ABNORMAL HIGH (ref 11.4–15.2)

## 2018-08-19 MED ORDER — BISACODYL 10 MG RE SUPP
10.0000 mg | Freq: Every day | RECTAL | Status: AC
  Administered 2018-08-19: 10 mg via RECTAL
  Filled 2018-08-19 (×2): qty 1

## 2018-08-19 MED ORDER — PANTOPRAZOLE SODIUM 40 MG IV SOLR
40.0000 mg | Freq: Two times a day (BID) | INTRAVENOUS | Status: DC
  Administered 2018-08-20: 40 mg via INTRAVENOUS
  Filled 2018-08-19: qty 40

## 2018-08-19 MED ORDER — SENNOSIDES-DOCUSATE SODIUM 8.6-50 MG PO TABS
1.0000 | ORAL_TABLET | Freq: Two times a day (BID) | ORAL | Status: DC
  Administered 2018-08-19 – 2018-08-22 (×4): 1 via ORAL
  Filled 2018-08-19 (×5): qty 1

## 2018-08-19 MED ORDER — POLYETHYLENE GLYCOL 3350 17 G PO PACK
17.0000 g | PACK | Freq: Every day | ORAL | Status: DC
  Administered 2018-08-19 – 2018-08-22 (×3): 17 g via ORAL
  Filled 2018-08-19 (×3): qty 1

## 2018-08-19 MED ORDER — SODIUM CHLORIDE 0.9 % IV SOLN
80.0000 mg | Freq: Once | INTRAVENOUS | Status: AC
  Administered 2018-08-19: 80 mg via INTRAVENOUS
  Filled 2018-08-19: qty 80

## 2018-08-19 NOTE — Progress Notes (Addendum)
[facility] Progress Note   Subjective: Patient seen and examined at bedside. Reports SOB improved post HD.  Denies SOB, CP, n/v/d, weakness and fatigue.  Complains of intermittent stinging skin on inside of L upper arm, which has been going on for a few weeks.  He has been missing HD because he has nowhere to go after and does not feel up to looking for somewhere once he completes treatments.   States he will likely continue to miss OP HD and have to come to the hospital for treatment until his housing situations is resolved.  Reports he has been talking to OP SW about establishing housing but have been unable to acquire at this point.  Per [facility] SW note, patient declined shelter list or help connecting to resources in the area.    Objective Vitals:   08/18/18 2051 08/18/18 2153 08/19/18 0532 08/19/18 0851  BP: 121/67 133/70 (!) 157/89 (!) 161/95  Pulse: 84 91 91 89  Resp: 18 (!) 22 20 18   Temp: 98.4 F (36.9 C) 98.6 F (37 C) 98.5 F (36.9 C) 98.6 F (37 C)  TempSrc: Oral Oral  Oral  SpO2: 98% 99% 98% 99%  Height:       Physical Exam General:NAD, well appearing male, laying in bed Heart:RRR, no mrg Lungs:CTAB, no wheezes, rales or rhonchi Abdomen:soft, NTND Extremities:trace LE edema Dialysis Access: LU AVF   There were no vitals filed for this visit.  Intake/Output Summary (Last 24 hours) at 08/19/2018 1054 Last data filed at 08/19/2018 0946 Gross per 24 hour  Intake 1125 ml  Output 3000 ml  Net -1875 ml    Additional Objective Labs: Basic Metabolic Panel: Recent Labs  Lab 08/15/18 0440 08/18/18 1501 08/19/18 0528  NA 136 142 138  K 4.9 6.2* 4.4  CL 97* 101 98  CO2 23 20* 25  GLUCOSE 74 84 80  BUN 64* 120* 47*  CREATININE 16.04* 22.27* 11.44*  CALCIUM 7.5* 8.0* 7.7*   CBC: Recent Labs  Lab 08/13/18 0411 08/15/18 0440 08/18/18 1501 08/19/18 0528  WBC 3.0* 2.2* 3.7* 3.9*  NEUTROABS  --   --   --  2.3  HGB 9.1* 7.8* 6.9* 7.6*  HCT 29.5*  26.0* 22.4* 23.9*  MCV 99.7 100.0 101.4* 94.1  PLT 99* 96* 135* 133*  Iron Studies:  Recent Labs    08/18/18 1728  IRON 54  TIBC 232*   Lab Results  Component Value Date   INR 1.23 08/19/2018   INR 1.27 08/18/2018   INR 1.55 08/13/2018   Studies/Results: Dg Chest 2 View  Result Date: 08/18/2018 CLINICAL DATA:  Pt reports he last went to dialysis was 08-11-2018. Pt reports he does not go because he is homeless and he is not going through all that. Pt has bilateral lower extremity swelling. EXAM: CHEST - 2 VIEW COMPARISON:  08/15/2018 FINDINGS: The heart is enlarged. Lungs are free of focal consolidations and pleural effusions. No pulmonary edema. IMPRESSION: Cardiomegaly. Electronically Signed   By: Nolon Nations M.D.   On: 08/18/2018 15:45    Medications: . sodium chloride    . sodium chloride    . calcium gluconate 1 GM IVPB     . sodium chloride   Intravenous Once  . sodium chloride   Intravenous Once  . amiodarone  200 mg Oral Daily  . bisacodyl  10 mg Rectal Daily  . Chlorhexidine Gluconate Cloth  6 each Topical Q0600  . darbepoetin (ARANESP) injection - DIALYSIS  60 mcg Intravenous Once  . metoprolol succinate  50 mg Oral Daily  . polyethylene glycol  17 g Oral Daily  . senna-docusate  1 tablet Oral BID    Dialysis Orders: [facility] - TTS 4hrs 450/800 EDW 74kg 2K/2.5Ca LU AVF Hep 2000 units Parsabiv 15mg  qHD mircera 124mcg q2wks, last 217mcg mircera given on 06/05/18 Calcitriol 1.5mg  qHD  Assessment/Plan: 1. Hyperkalemia - due to non compliance.  Resolved with HD. K 4.4. 2. SOB/Volume overload 2/2 noncompliance with HD.  Improved post.  CXR unremarkable. Volume status stable.  3. ESRD -on HD TTS.  HD yesterday after missing for 1 week. Has been non compliant lately due to lack of housing.  Encouraged compliance and discussed consequences of missing HD.  SW consulted but patient declined additional assistance offered. Will f/u with OP SW about housing.  4. Anemia of  CKD- Hgb 7.6. Tsat 23%. 5. Secondary hyperparathyroidism - Ca a little low.  Phos high as OP. Continue binders, VDRA.  Unable to give parsabiv b/c not available in hospital. 6. HTN- BP elevated.  Continue home meds.  7. Nutrition - Renal diet w/fluid restrictions 8. Homelessness - SW consulting.  Patient declined additional assistance they offered.  Will f/u with SW at OP unit.  9. CAD 10. PVD 11. Hep C - s/p interferon therapy   Jen Mow, PA-C [facility] Pager: [phone] 08/19/2018,10:54 AM  LOS: 0 days

## 2018-08-19 NOTE — Progress Notes (Signed)
CSW provided substance abuse resources.   CSW signing off.   Domenic Schwab, MSW, [place]

## 2018-08-19 NOTE — Progress Notes (Signed)
PROGRESS NOTE  Raymond [name] MBW:[id number] DOB: 04-09-1956 DOA: 08/18/2018 PCP: Scot Jun, FNP  HPI/Recap of past 24 hours:  He received urgent dialysis yesterday evening, he is Feeling better, more alert and interactive Poor historian  RN reports patient has melena, FOBT+, he denies ab pain, no n/v.  hgb improve 7.6 after two units prbc  Assessment/Plan: Active Problems:   ESRD (end stage renal disease) ([place])  ESRD missing HD With hyperkalemia on presentation Urgent HD Plan per nephrology  Anemia Acute on chronic  Melena, + FOBT S/p one unit prbc during dialysis GI consulted, Dr Loletha Carrow recommended give protonic80mg  x1 now then protonix IV 40mg  bid , keep npo after midnight, GI will see in am.   PAF Currently in sinus rhythm Suppose to be on betablocker, amiodarone and coumadin He does not want to take coumadin  PAD s/p bypass ( Left femoral to below-knee popliteal bypass with reverse great saphenous vein) in 03/2018 Suppose to be on asa 81 and statin per vascular surgery d/c summary in 03/2018  COPD, not on home o2, no hypoxia, no wheezing Nebs prn  Aortic aneurysm CTa noted ascending thoracic aortic aneurysm measuring 4.4 cm at the level of the main pulmonary artery, which was felt to be unchanged since a CT Dec 2018- will need follow-up annualimaging by CTA or MRA as outpt  H/o polysubstance abuse (alcohol and cocaine) He is interested in inpatient detox facility, but was told he does not qualify due to on dialysis  Smoker: smoking cessation education  Alcohol use: denies h/o alcohol withdrawal  Homeless Social worker consult  DVT prophylaxis: scd's  Consultants: nephrology and gi  Code Status: full   Family Communication:  Patient    Procedures:  Urgent HD  Antibiotics:  none   Objective: BP (!) 161/95 (BP Location: Right Arm)   Pulse 89   Temp 98.6 F (37 C) (Oral)   Resp 18   Ht 5\' 8"  (1.727 m)   Wt 76.7 kg   SpO2  99%   BMI 25.73 kg/m   Intake/Output Summary (Last 24 hours) at 08/19/2018 1613 Last data filed at 08/19/2018 1330 Gross per 24 hour  Intake 1365 ml  Output 3000 ml  Net -1635 ml   Filed Weights   08/19/18 1601  Weight: 76.7 kg    Exam: Patient is examined daily including today on 08/19/2018, exams remain the same as of yesterday except that has changed    General:  NAD  Cardiovascular: RRR  Respiratory: CTABL  Abdomen: Soft/ND/NT, positive BS  Musculoskeletal: No Edema  Neuro: alert, oriented   Data Reviewed: Basic Metabolic Panel: Recent Labs  Lab 08/15/18 0440 08/18/18 1501 08/19/18 0528  NA 136 142 138  K 4.9 6.2* 4.4  CL 97* 101 98  CO2 23 20* 25  GLUCOSE 74 84 80  BUN 64* 120* 47*  CREATININE 16.04* 22.27* 11.44*  CALCIUM 7.5* 8.0* 7.7*   Liver Function Tests: No results for input(s): AST, ALT, ALKPHOS, BILITOT, PROT, ALBUMIN in the last 168 hours. No results for input(s): LIPASE, AMYLASE in the last 168 hours. No results for input(s): AMMONIA in the last 168 hours. CBC: Recent Labs  Lab 08/13/18 0411 08/15/18 0440 08/18/18 1501 08/19/18 0528  WBC 3.0* 2.2* 3.7* 3.9*  NEUTROABS  --   --   --  2.3  HGB 9.1* 7.8* 6.9* 7.6*  HCT 29.5* 26.0* 22.4* 23.9*  MCV 99.7 100.0 101.4* 94.1  PLT 99* 96* 135* 133*  Cardiac Enzymes:   No results for input(s): CKTOTAL, CKMB, CKMBINDEX, TROPONINI in the last 168 hours. BNP (last 3 results) No results for input(s): BNP in the last 8760 hours.  ProBNP (last 3 results) No results for input(s): PROBNP in the last 8760 hours.  CBG: No results for input(s): GLUCAP in the last 168 hours.  No results found for this or any previous visit (from the past 240 hour(s)).   Studies: No results found.  Scheduled Meds: . sodium chloride   Intravenous Once  . sodium chloride   Intravenous Once  . amiodarone  200 mg Oral Daily  . bisacodyl  10 mg Rectal Daily  . Chlorhexidine Gluconate Cloth  6 each Topical Q0600    . darbepoetin (ARANESP) injection - DIALYSIS  60 mcg Intravenous Once  . metoprolol succinate  50 mg Oral Daily  . pantoprazole (PROTONIX) IV  40 mg Intravenous Q12H  . polyethylene glycol  17 g Oral Daily  . senna-docusate  1 tablet Oral BID    Continuous Infusions: . sodium chloride    . sodium chloride    . calcium gluconate 1 GM IVPB    . pantoprazole (PROTONIX) IV       Time spent: 62mins I have personally reviewed and interpreted on  08/19/2018 daily labs, tele strips, imagings as discussed above under date review session and assessment and plans.  I reviewed all nursing notes, pharmacy notes, consultant notes,  vitals, pertinent old records  I have discussed plan of care as described above with RN , patient  on 08/19/2018   Florencia Reasons MD, PhD  [facility] Pager [phone]. If 7PM-7AM, please contact night-coverage at [email or web address], password [facility] 08/19/2018, 4:13 PM  LOS: 0 days

## 2018-08-19 NOTE — Care Management Obs Status (Signed)
[facility] NOTIFICATION   Patient Details  Name: Raymond [name] MRN: [id number] Date of Birth: 12-19-1955   Medicare Observation Status Notification Given:  Yes    Carles Collet, RN 08/19/2018, 2:08 PM

## 2018-08-19 NOTE — Progress Notes (Signed)
CSW acknowledges social work consult.   CSW went and met with the patient at bedside. Patient was awake, alert, and eating breakfast. CSW spoke with the patient about his homelessness issue. He stated that he lives in [place] until his monthly check runs out. CSW stated that she had a shelter list for him, he declined the shelter list, he stated that he had one.   CSW placed shelter list in the patient's shadow chart incase if he changed his mind.   CSW asked if the patient had been to any resources in the area, patient reported that he has tried to use multiple resources in the area but has not been properly helped. CSW asked if there was any resources that she could link him with, patient shook his head no.   CSW stated that if the patient needed bus passes or a cab voucher for discharge to notify his nurse and it could be provided.   CSW is singing off.   Domenic Schwab, MSW, [employer]

## 2018-08-20 ENCOUNTER — Encounter (HOSPITAL_COMMUNITY): Payer: Self-pay | Admitting: *Deleted

## 2018-08-20 ENCOUNTER — Observation Stay (HOSPITAL_COMMUNITY): Payer: Medicare Other | Admitting: Certified Registered"

## 2018-08-20 ENCOUNTER — Encounter (HOSPITAL_COMMUNITY)
Admission: EM | Discharge status: Against Medical Advice | Payer: Self-pay | Source: Home / Self Care | Attending: Internal Medicine

## 2018-08-20 DIAGNOSIS — Z833 Family history of diabetes mellitus: Secondary | ICD-10-CM | POA: Diagnosis not present

## 2018-08-20 DIAGNOSIS — Z8249 Family history of ischemic heart disease and other diseases of the circulatory system: Secondary | ICD-10-CM | POA: Diagnosis not present

## 2018-08-20 DIAGNOSIS — D631 Anemia in chronic kidney disease: Secondary | ICD-10-CM | POA: Diagnosis not present

## 2018-08-20 DIAGNOSIS — N2581 Secondary hyperparathyroidism of renal origin: Secondary | ICD-10-CM | POA: Diagnosis present

## 2018-08-20 DIAGNOSIS — Z7901 Long term (current) use of anticoagulants: Secondary | ICD-10-CM | POA: Diagnosis not present

## 2018-08-20 DIAGNOSIS — D123 Benign neoplasm of transverse colon: Secondary | ICD-10-CM | POA: Diagnosis not present

## 2018-08-20 DIAGNOSIS — K5731 Diverticulosis of large intestine without perforation or abscess with bleeding: Secondary | ICD-10-CM | POA: Diagnosis not present

## 2018-08-20 DIAGNOSIS — Z590 Homelessness: Secondary | ICD-10-CM | POA: Diagnosis not present

## 2018-08-20 DIAGNOSIS — Z9114 Patient's other noncompliance with medication regimen: Secondary | ICD-10-CM | POA: Diagnosis not present

## 2018-08-20 DIAGNOSIS — Z9119 Patient's noncompliance with other medical treatment and regimen: Secondary | ICD-10-CM | POA: Diagnosis not present

## 2018-08-20 DIAGNOSIS — K921 Melena: Secondary | ICD-10-CM | POA: Diagnosis not present

## 2018-08-20 DIAGNOSIS — I120 Hypertensive chronic kidney disease with stage 5 chronic kidney disease or end stage renal disease: Secondary | ICD-10-CM | POA: Diagnosis not present

## 2018-08-20 DIAGNOSIS — R0602 Shortness of breath: Secondary | ICD-10-CM | POA: Diagnosis present

## 2018-08-20 DIAGNOSIS — K31819 Angiodysplasia of stomach and duodenum without bleeding: Secondary | ICD-10-CM | POA: Diagnosis not present

## 2018-08-20 DIAGNOSIS — D122 Benign neoplasm of ascending colon: Secondary | ICD-10-CM | POA: Diagnosis not present

## 2018-08-20 DIAGNOSIS — D126 Benign neoplasm of colon, unspecified: Secondary | ICD-10-CM | POA: Diagnosis not present

## 2018-08-20 DIAGNOSIS — I712 Thoracic aortic aneurysm, without rupture: Secondary | ICD-10-CM | POA: Diagnosis present

## 2018-08-20 DIAGNOSIS — E7800 Pure hypercholesterolemia, unspecified: Secondary | ICD-10-CM | POA: Diagnosis present

## 2018-08-20 DIAGNOSIS — K648 Other hemorrhoids: Secondary | ICD-10-CM | POA: Diagnosis not present

## 2018-08-20 DIAGNOSIS — Z992 Dependence on renal dialysis: Secondary | ICD-10-CM | POA: Diagnosis not present

## 2018-08-20 DIAGNOSIS — I480 Paroxysmal atrial fibrillation: Secondary | ICD-10-CM | POA: Diagnosis present

## 2018-08-20 DIAGNOSIS — Z9115 Patient's noncompliance with renal dialysis: Secondary | ICD-10-CM | POA: Diagnosis not present

## 2018-08-20 DIAGNOSIS — D62 Acute posthemorrhagic anemia: Secondary | ICD-10-CM | POA: Diagnosis not present

## 2018-08-20 DIAGNOSIS — F17210 Nicotine dependence, cigarettes, uncomplicated: Secondary | ICD-10-CM | POA: Diagnosis present

## 2018-08-20 DIAGNOSIS — E875 Hyperkalemia: Secondary | ICD-10-CM | POA: Diagnosis not present

## 2018-08-20 DIAGNOSIS — I739 Peripheral vascular disease, unspecified: Secondary | ICD-10-CM | POA: Diagnosis present

## 2018-08-20 DIAGNOSIS — N186 End stage renal disease: Secondary | ICD-10-CM | POA: Diagnosis not present

## 2018-08-20 DIAGNOSIS — Z8619 Personal history of other infectious and parasitic diseases: Secondary | ICD-10-CM | POA: Diagnosis not present

## 2018-08-20 DIAGNOSIS — D649 Anemia, unspecified: Secondary | ICD-10-CM | POA: Diagnosis not present

## 2018-08-20 DIAGNOSIS — D696 Thrombocytopenia, unspecified: Secondary | ICD-10-CM | POA: Diagnosis present

## 2018-08-20 DIAGNOSIS — J449 Chronic obstructive pulmonary disease, unspecified: Secondary | ICD-10-CM | POA: Diagnosis present

## 2018-08-20 DIAGNOSIS — I2510 Atherosclerotic heart disease of native coronary artery without angina pectoris: Secondary | ICD-10-CM | POA: Diagnosis present

## 2018-08-20 DIAGNOSIS — F1920 Other psychoactive substance dependence, uncomplicated: Secondary | ICD-10-CM | POA: Diagnosis present

## 2018-08-20 HISTORY — PX: ESOPHAGOGASTRODUODENOSCOPY (EGD) WITH PROPOFOL: SHX5813

## 2018-08-20 HISTORY — PX: HOT HEMOSTASIS: SHX5433

## 2018-08-20 LAB — BASIC METABOLIC PANEL
Anion gap: 15 (ref 5–15)
BUN: 62 mg/dL — AB (ref 8–23)
CO2: 23 mmol/L (ref 22–32)
Calcium: 7.7 mg/dL — ABNORMAL LOW (ref 8.9–10.3)
Chloride: 104 mmol/L (ref 98–111)
Creatinine, Ser: 14.14 mg/dL — ABNORMAL HIGH (ref 0.61–1.24)
GFR calc Af Amer: 4 mL/min — ABNORMAL LOW (ref >60)
GFR calc non Af Amer: 3 mL/min — ABNORMAL LOW (ref >60)
GLUCOSE: 84 mg/dL (ref 70–99)
Potassium: 4.6 mmol/L (ref 3.5–5.1)
Sodium: 142 mmol/L (ref 135–145)

## 2018-08-20 LAB — HEMOGLOBIN AND HEMATOCRIT, BLOOD
HCT: 24.1 % — ABNORMAL LOW (ref 39.0–52.0)
Hemoglobin: 7.3 g/dL — ABNORMAL LOW (ref 13.0–17.0)

## 2018-08-20 SURGERY — ESOPHAGOGASTRODUODENOSCOPY (EGD) WITH PROPOFOL
Anesthesia: Monitor Anesthesia Care

## 2018-08-20 MED ORDER — SODIUM CHLORIDE 0.9 % IV SOLN
125.0000 mg | INTRAVENOUS | Status: DC
  Administered 2018-08-21: 125 mg via INTRAVENOUS
  Filled 2018-08-20: qty 10

## 2018-08-20 MED ORDER — PROPOFOL 10 MG/ML IV BOLUS
INTRAVENOUS | Status: DC | PRN
  Administered 2018-08-20 (×4): 20 mg via INTRAVENOUS

## 2018-08-20 MED ORDER — BUTAMBEN-TETRACAINE-BENZOCAINE 2-2-14 % EX AERO
INHALATION_SPRAY | CUTANEOUS | Status: DC | PRN
  Administered 2018-08-20: 2 via TOPICAL

## 2018-08-20 MED ORDER — DARBEPOETIN ALFA 100 MCG/0.5ML IJ SOSY
100.0000 ug | PREFILLED_SYRINGE | INTRAMUSCULAR | Status: DC
  Administered 2018-08-21: 100 ug via INTRAVENOUS
  Filled 2018-08-20: qty 0.5

## 2018-08-20 MED ORDER — PEG 3350-KCL-NA BICARB-NACL 420 G PO SOLR
4000.0000 mL | Freq: Once | ORAL | Status: AC
  Administered 2018-08-20: 4000 mL via ORAL
  Filled 2018-08-20: qty 4000

## 2018-08-20 MED ORDER — BISACODYL 5 MG PO TBEC: 20.0000 mg | DELAYED_RELEASE_TABLET | Freq: Once | ORAL | Status: DC

## 2018-08-20 MED ORDER — SODIUM CHLORIDE 0.9 % IV SOLN
INTRAVENOUS | Status: DC
  Administered 2018-08-20: 09:00:00 via INTRAVENOUS

## 2018-08-20 MED ORDER — PEG-KCL-NACL-NASULF-NA ASC-C 100 G PO SOLR: 0.5000 | Freq: Once | ORAL | Status: DC

## 2018-08-20 MED ORDER — METOCLOPRAMIDE HCL 5 MG/ML IJ SOLN: 10.0000 mg | Freq: Once | INTRAMUSCULAR | Status: DC

## 2018-08-20 MED ORDER — LIDOCAINE HCL (CARDIAC) PF 100 MG/5ML IV SOSY
PREFILLED_SYRINGE | INTRAVENOUS | Status: DC | PRN
  Administered 2018-08-20: 40 mg via INTRAVENOUS

## 2018-08-20 MED ORDER — GLYCOPYRROLATE 0.2 MG/ML IJ SOLN
INTRAMUSCULAR | Status: DC | PRN
  Administered 2018-08-20 (×2): 0.1 mg via INTRAVENOUS

## 2018-08-20 MED ORDER — PROPOFOL 500 MG/50ML IV EMUL
INTRAVENOUS | Status: DC | PRN
  Administered 2018-08-20: 150 ug/kg/min via INTRAVENOUS

## 2018-08-20 MED ORDER — SODIUM CHLORIDE 0.9 % IV SOLN
INTRAVENOUS | Status: DC
  Administered 2018-08-21: 10:00:00 via INTRAVENOUS

## 2018-08-20 SURGICAL SUPPLY — 15 items

## 2018-08-20 NOTE — Anesthesia Preprocedure Evaluation (Addendum)
Anesthesia Evaluation  Patient identified by MRN, date of birth, ID band Patient awake    Reviewed: Allergy & Precautions, NPO status , Patient's Chart, lab work & pertinent test results, reviewed documented beta blocker date and time   History of Anesthesia Complications Negative for: history of anesthetic complications  Airway Mallampati: III  TM Distance: >3 FB Neck ROM: Full    Dental  (+) Dental Advisory Given, Teeth Intact   Pulmonary COPD,  COPD inhaler, Current Smoker,    breath sounds clear to auscultation       Cardiovascular hypertension, Pt. on medications and Pt. on home beta blockers + CAD and + Peripheral Vascular Disease   Rhythm:Regular Rate:Normal   '19 TTE - Severe LVH. EF 60% to 65%. LA was moderately dilated.  '19 Cath - 1.Mild, non-obstructive coronary artery disease. 2.Normal left ventricular systolic function with upper normal left ventricular filling pressure   Neuro/Psych negative neurological ROS  negative psych ROS   GI/Hepatic negative GI ROS, (+) Hepatitis -, C  Endo/Other  negative endocrine ROS  Renal/GU ESRF and DialysisRenal disease     Musculoskeletal negative musculoskeletal ROS (+)   Abdominal   Peds  Hematology  (+) anemia ,  Thrombocytopenia    Anesthesia Other Findings   Reproductive/Obstetrics                            Anesthesia Physical Anesthesia Plan  ASA: III  Anesthesia Plan: MAC   Post-op Pain Management:    Induction: Intravenous  PONV Risk Score and Plan: 1 and Propofol infusion and Treatment may vary due to age or medical condition  Airway Management Planned: Nasal Cannula and Natural Airway  Additional Equipment: None  Intra-op Plan:   Post-operative Plan:   Informed Consent: I have reviewed the patients History and Physical, chart, labs and discussed the procedure including the risks, benefits and alternatives for the  proposed anesthesia with the patient or authorized representative who has indicated his/her understanding and acceptance.       Plan Discussed with: CRNA and Anesthesiologist  Anesthesia Plan Comments:        Anesthesia Quick Evaluation

## 2018-08-20 NOTE — Progress Notes (Signed)
PROGRESS NOTE  Raymond [name] CBS:[id number] DOB: [birth year] DOA: 08/18/2018 PCP: Scot Jun, FNP  HPI/Recap of past 24 hours:   Returned from EGD he denies ab pain, no n/v. No overt bleed, no fever  Poor historian  Assessment/Plan: Active Problems:   ESRD (end stage renal disease) ([place])   Homeless   Non-compliance with renal dialysis ([place])   Anemia due to chronic kidney disease, on chronic dialysis ([place])   Acute on chronic anemia   H/O medication noncompliance   Polysubstance (excluding opioids) dependence ([place])   Melena  ESRD missing HD Presented with sob, hyperkalemia on presentation S/p Urgent HD on admission  Plan per nephrology  Anemia Acute on chronic  Melena, + FOBT S/p one unit prbc during dialysis on admission GI consulted, on ppi,  S/p EGD + duodenal AVM s/p APC, plan for colonoscopy tomorrow GI input appreciated, will follow Gi recommendation regarding anticoagulation   PAF Currently in sinus rhythm Suppose to be on betablocker, amiodarone and coumadin He does not want to take coumadin will discuss with pharmacy regarding eliquis if he is cleared to restart anticoagulation by GI  PAD s/p bypass ( Left femoral to below-knee popliteal bypass with reverse great saphenous vein) in 03/2018 Suppose to be on asa 81 and statin per vascular surgery d/c summary in 03/2018  COPD, not on home o2, no hypoxia, no wheezing Nebs prn  Aortic aneurysm CTa noted ascending thoracic aortic aneurysm measuring 4.4 cm at the level of the main pulmonary artery, which was felt to be unchanged since a CT Dec 2018- will need follow-up annualimaging by CTA or MRA as outpt  H/o polysubstance abuse (alcohol and cocaine) He is interested in inpatient detox facility, but was told he does not qualify due to on dialysis  Smoker: smoking cessation education  Alcohol use: denies h/o alcohol withdrawal  Homeless Education officer, museum consult  DVT prophylaxis:  scd's  Consultants: nephrology and gi  Code Status: full   Family Communication:  Patient    Procedures:  Urgent HD  EGD  Antibiotics:  none   Objective: BP 119/75   Pulse 87   Temp 98.5 F (36.9 C) (Oral)   Resp (!) 22   Ht 5\' 8"  (1.727 m)   Wt 76.7 kg   SpO2 100%   BMI 25.73 kg/m   Intake/Output Summary (Last 24 hours) at 08/20/2018 1529 Last data filed at 08/20/2018 1246 Gross per 24 hour  Intake 640 ml  Output 0 ml  Net 640 ml   Filed Weights   08/19/18 1601 08/20/18 0853  Weight: 76.7 kg 76.7 kg    Exam: Patient is examined daily including today on 08/20/2018, exams remain the same as of yesterday except that has changed    General:  NAD  Cardiovascular: RRR  Respiratory: CTABL  Abdomen: Soft/ND/NT, positive BS  Musculoskeletal: No Edema  Neuro: alert, oriented   Data Reviewed: Basic Metabolic Panel: Recent Labs  Lab 08/15/18 0440 08/18/18 1501 08/19/18 0528 08/20/18 0800  NA 136 142 138 142  K 4.9 6.2* 4.4 4.6  CL 97* 101 98 104  CO2 23 20* 25 23  GLUCOSE 74 84 80 84  BUN 64* 120* 47* 62*  CREATININE 16.04* 22.27* 11.44* 14.14*  CALCIUM 7.5* 8.0* 7.7* 7.7*   Liver Function Tests: No results for input(s): AST, ALT, ALKPHOS, BILITOT, PROT, ALBUMIN in the last 168 hours. No results for input(s): LIPASE, AMYLASE in the last 168 hours. No results for input(s): AMMONIA in the  last 168 hours. CBC: Recent Labs  Lab 08/15/18 0440 08/18/18 1501 08/19/18 0528 08/20/18 0800  WBC 2.2* 3.7* 3.9*  --   NEUTROABS  --   --  2.3  --   HGB 7.8* 6.9* 7.6* 7.3*  HCT 26.0* 22.4* 23.9* 24.1*  MCV 100.0 101.4* 94.1  --   PLT 96* 135* 133*  --    Cardiac Enzymes:   No results for input(s): CKTOTAL, CKMB, CKMBINDEX, TROPONINI in the last 168 hours. BNP (last 3 results) No results for input(s): BNP in the last 8760 hours.  ProBNP (last 3 results) No results for input(s): PROBNP in the last 8760 hours.  CBG: No results for input(s):  GLUCAP in the last 168 hours.  No results found for this or any previous visit (from the past 240 hour(s)).   Studies: No results found.  Scheduled Meds: . sodium chloride   Intravenous Once  . sodium chloride   Intravenous Once  . amiodarone  200 mg Oral Daily  . bisacodyl  10 mg Rectal Daily  . Chlorhexidine Gluconate Cloth  6 each Topical Q0600  . [START ON 08/21/2018] darbepoetin (ARANESP) injection - DIALYSIS  100 mcg Intravenous Q Tue-HD  . metoprolol succinate  50 mg Oral Daily  . polyethylene glycol  17 g Oral Daily  . polyethylene glycol-electrolytes  4,000 mL Oral Once  . senna-docusate  1 tablet Oral BID    Continuous Infusions: . sodium chloride    . sodium chloride    . sodium chloride Stopped (08/20/18 1100)  . calcium gluconate 1 GM IVPB    . [START ON 08/21/2018] ferric gluconate (FERRLECIT/NULECIT) IV       Time spent: 54mins, case discussed with nephrology  I have personally reviewed and interpreted on  08/20/2018 daily labs, tele strips, imagings as discussed above under date review session and assessment and plans.  I reviewed all nursing notes, pharmacy notes, consultant notes,  vitals, pertinent old records  I have discussed plan of care as described above with RN , patient  on 08/20/2018   Florencia Reasons MD, PhD  [facility] Pager [phone]. If 7PM-7AM, please contact night-coverage at [email or web address], password [facility] 08/20/2018, 3:29 PM  LOS: 0 days

## 2018-08-20 NOTE — Consult Note (Signed)
Consultation  Referring Provider:     Florencia Reasons MD Primary Care Physician:  Scot Jun, FNP Primary Gastroenterologist:        Althia Forts Reason for Consultation:     melena         HPI:   Raymond [name] is a 63 y.o. male with ESRD on HD, history of hepatitis C (treated, without known cirrhosis), CAD, COPD, PAF, who presented to the hospital initially with shortness of breath and hypokalemia. Underwent emergent dialysis with nephrology after admission. He was noted to have worsening of chronic anemia with Hgb to the 6s, and he endorsed some black stools. He had some nausea and vomiting within the past week or so, states he "didn't look at it", unsure if that contained any blood. He's also had some "tightness" in his upper abdomen but no significant pain. He has been eating well otherwise. He denies any bright red blood per rectum, but states he has had some dark black stools recently. He thinks he had a bowel movement overnight, but unable to quantify how long he has had black stools or how many he has had. He was started on IV protonix yesterday afternoon. His Hgb improved to 7.6 following a blood transfusion as of yesterday, labs not done for this morning yet. He does not think he has ever had a prior upper endoscopy. He thinks he has had a prior colonoscopy, unclear when or what it showed, thinks it was a long time ago. He does not take any PPI or antacid as outpatient. He denies any NSAID use. He is supposed to be on coumadin for atrial fibrillation but he does not take it. He denies any cardiopulmonary symptoms this morning. He denies any FH of colon cancer or gastric cancer.  He has some mild thrombocytopenia with platelets in the 130s. He had a CT abdomen / pelvis in October 2019 which showed no evidence of underlying cirrhosis. Liver enzymes are normal.   Past Medical History:  Diagnosis Date  . Anemia   . CAD (coronary artery disease)    a. 03/2018: cath showing 20 to 30%  stenosis along the LAD, luminal irregularities along the RCA, and angiographically normal LCx  . COPD (chronic obstructive pulmonary disease) ([place])   . Dyspnea    "with too much fluid"  . ESRD (end stage renal disease) on dialysis [facility])    "MWF; J[facility] Rinks" (07/22/17)  . Hemodialysis patient ([place])   . Hepatitis C    Still positive s/p liver biopsy at [facility]  and interferon therapy for 6 months. Most recent lab work was on 10/24/12  . Hepatitis C    "took the tx; gone now" (12/05/2016)  Was treated  . History of blood transfusion ~ 2012/2013   "related to my kidneys; blood was low"  . Hypertension   . Substance abuse ([place])   . Thyroid disease     Past Surgical History:  Procedure Laterality Date  . ABDOMINAL AORTOGRAM W/LOWER EXTREMITY N/A 02/08/2018   Procedure: ABDOMINAL AORTOGRAM W/LOWER EXTREMITY;  Surgeon: Conrad [place], MD;  Location: [place] [facility];  Service: Cardiovascular;  Laterality: N/A;  . AV FISTULA PLACEMENT Left Aug. 2013 ?  . FEMORAL-POPLITEAL BYPASS GRAFT Left 04/11/2018   Procedure: BYPASS GRAFT FEMORAL-POPLITEAL ARTERY LEFT LEG;  Surgeon: Rosetta Posner, MD;  Location: [place];  Service: Vascular;  Laterality: Left;  . LEFT HEART CATH AND CORONARY ANGIOGRAPHY N/A 03/29/2018   Procedure: LEFT HEART CATH AND  CORONARY ANGIOGRAPHY;  Surgeon: Nelva Bush, MD;  Location: [place] [facility];  Service: Cardiovascular;  Laterality: N/A;  . LIVER BIOPSY    . ORIF ULNAR FRACTURE Left 05/18/2017   Procedure: OPEN REDUCTION INTERNAL FIXATION (ORIF) ULNAR FRACTURE;  Surgeon: Iran Planas, MD;  Location: [place];  Service: Orthopedics;  Laterality: Left;  . SHUNTOGRAM N/A 12/11/2012   Procedure: Earney Mallet;  Surgeon: Serafina Mitchell, MD;  Location: [facility];  Service: Cardiovascular;  Laterality: N/A;    Family History  Problem Relation Age of Onset  . Diabetes Father   . Hypertension Father   . Heart disease Father      Social History   Tobacco Use  . Smoking status:  Current Every Day Smoker    Packs/day: 1.00    Years: 25.00    Pack years: 25.00    Types: Cigarettes    Last attempt to quit: 08/01/2011    Years since quitting: 7.0  . Smokeless tobacco: Never Used  . Tobacco comment: he smokes 6 cigarettes a week  Substance Use Topics  . Alcohol use: Yes    Comment: occasionally/ one half pint  . Drug use: Yes    Types: Cocaine    Comment: he used yesterday    Prior to Admission medications   Medication Sig Start Date End Date Taking? Authorizing Provider  acetaminophen (TYLENOL) 325 MG tablet Take 650 mg by mouth every 6 (six) hours as needed for mild pain.    [provider]  amiodarone (PACERONE) 200 MG tablet Take 1 tablet (200 mg total) by mouth daily. 08/13/18   Cherene Altes, MD  amLODipine (NORVASC) 2.5 MG tablet Take 2.5 mg by mouth daily.    [provider]  metoprolol succinate (TOPROL-XL) 50 MG 24 hr tablet Take 50 mg by mouth daily. Take with or immediately following a meal.    [provider]  ondansetron (ZOFRAN) 4 MG tablet Take 1 tablet (4 mg total) by mouth every 8 (eight) hours as needed for nausea or vomiting. Patient not taking: Reported on 08/18/2018 04/03/18   Thurnell Lose, MD  warfarin (COUMADIN) 5 MG tablet Take 1 tablet (5 mg total) by mouth daily at 6 PM. 08/13/18   Cherene Altes, MD    Current Facility-Administered Medications  Medication Dose Route Frequency Provider Last Rate Last Dose  . 0.9 %  sodium chloride infusion (Manually program via Guardrails IV Fluids)   Intravenous Once Florencia Reasons, MD      . 0.9 %  sodium chloride infusion (Manually program via Guardrails IV Fluids)   Intravenous Once Florencia Reasons, MD      . 0.9 %  sodium chloride infusion  100 mL Intravenous PRN Finnigan, Nancy A, DO      . 0.9 %  sodium chloride infusion  100 mL Intravenous PRN Finnigan, Nancy A, DO      . acetaminophen (TYLENOL) tablet 650 mg  650 mg Oral Q6H PRN Florencia Reasons, MD      . amiodarone (PACERONE)  tablet 200 mg  200 mg Oral Daily Florencia Reasons, MD   200 mg at 08/19/18 1057  . bisacodyl (DULCOLAX) suppository 10 mg  10 mg Rectal Daily Florencia Reasons, MD   10 mg at 08/19/18 1351  . calcium gluconate 1 g in sodium chloride 0.9 % 100 mL IVPB  1 g Intravenous Once Florencia Reasons, MD      . Chlorhexidine Gluconate Cloth 2 % PADS 6 each  6  each Topical [id number] Florencia Reasons, MD   6 each at 08/20/18 [phone]  . Darbepoetin Alfa (ARANESP) injection 60 mcg  60 mcg Intravenous Once Finnigan, Nancy A, DO      . heparin injection 1,000 Units  1,000 Units Dialysis PRN Finnigan, Nancy A, DO      . lidocaine (PF) (XYLOCAINE) 1 % injection 5 mL  5 mL Intradermal PRN Finnigan, Nancy A, DO      . lidocaine-prilocaine (EMLA) cream 1 application  1 application Topical PRN Finnigan, Nancy A, DO      . metoprolol succinate (TOPROL-XL) 24 hr tablet 50 mg  50 mg Oral Daily Florencia Reasons, MD   50 mg at 08/19/18 1057  . pantoprazole (PROTONIX) injection 40 mg  40 mg Intravenous Q12H Florencia Reasons, MD   40 mg at 08/20/18 0556  . pentafluoroprop-tetrafluoroeth (GEBAUERS) aerosol 1 application  1 application Topical PRN Finnigan, Nancy A, DO      . polyethylene glycol (MIRALAX / GLYCOLAX) packet 17 g  17 g Oral Daily Florencia Reasons, MD   17 g at 08/19/18 1056  . senna-docusate (Senokot-S) tablet 1 tablet  1 tablet Oral BID Florencia Reasons, MD   1 tablet at 08/19/18 2115   Facility-Administered Medications Ordered in Other Encounters  Medication Dose Route Frequency Provider Last Rate Last Dose  . midazolam (VERSED) 5 MG/5ML injection    Anesthesia Intra-op Sammie Bench, CRNA   2 mg at 04/11/18 1042    Allergies as of 08/18/2018  . (No Known Allergies)     Review of Systems:    As per HPI, otherwise negative    Physical Exam:  Vital signs in last 24 hours: Temp:  [97.8 F (36.6 C)-99.1 F (37.3 C)] 97.8 F (36.6 C) (02/10 0505) Pulse Rate:  [82-89] 83 (02/10 0505) Resp:  [18-21] 21 (02/10 0505) BP: (151-162)/(88-99) 162/99 (02/10 0505) SpO2:   [95 %-99 %] 95 % (02/10 0505) Weight:  [76.7 kg] 76.7 kg (02/09 1601)   General:   Pleasant male in NAD Head:  Normocephalic and atraumatic. Eyes:   No icterus.   Conjunctiva pale Ears:  Normal auditory acuity. Neck:  Supple Lungs:  Respirations even and unlabored. Lungs clear to auscultation bilaterally.   No wheezes, crackles, or rhonchi.  Heart:  Regular rate and rhythm; no MRG Abdomen:  Soft, nondistended, nontender. No appreciable masses or hepatomegaly.  Rectal:  Not performed.  Msk:  Symmetrical without gross deformities.  Extremities:  Without edema. Neurologic:  Alert and  oriented x4;  grossly normal neurologically. Skin:  Intact without significant lesions or rashes. Psych:  Alert and cooperative. Normal affect.  LAB RESULTS: Recent Labs    08/18/18 1501 08/19/18 0528  WBC 3.7* 3.9*  HGB 6.9* 7.6*  HCT 22.4* 23.9*  PLT 135* 133*   BMET Recent Labs    08/18/18 1501 08/19/18 0528  NA 142 138  K 6.2* 4.4  CL 101 98  CO2 20* 25  GLUCOSE 84 80  BUN 120* 47*  CREATININE 22.27* 11.44*  CALCIUM 8.0* 7.7*   LFT No results for input(s): PROT, ALBUMIN, AST, ALT, ALKPHOS, BILITOT, BILIDIR, IBILI in the last 72 hours. PT/INR Recent Labs    08/18/18 1728 08/19/18 0528  LABPROT 15.8* 15.4*  INR 1.27 1.23    STUDIES: Dg Chest 2 View  Result Date: 08/18/2018 CLINICAL DATA:  Pt reports he last went to dialysis was 08-11-2018. Pt reports he does not go because he is homeless and he is not  going through all that. Pt has bilateral lower extremity swelling. EXAM: CHEST - 2 VIEW COMPARISON:  08/15/2018 FINDINGS: The heart is enlarged. Lungs are free of focal consolidations and pleural effusions. No pulmonary edema. IMPRESSION: Cardiomegaly. Electronically Signed   By: Nolon Nations M.D.   On: 08/18/2018 15:45        Impression / Plan:   63 y/o male with h/o hep C (eradicated - no cirrhosis noted) CAD, COPD, PAF not on anticoagulation, ESRD on HD, admitted with  hyperkalemia and shortness of breath, found to be anemic with recent dark stools concerning for melena. Stool is heme positive. He is a very poor historian, difficult to say duration and severity of symptoms. I don't believe he has had any significant GI bleeding since he has been here per nursing. He is hemodynamically stable at this time on IV protonix.   I discussed the situation with him, concerning for possible upper GI bleed. I recommend an upper endoscopy to further evaluate his anemia and symptoms, especially if he is considering resuming his anticoagulation. I discussed risks / benefits of anesthesia and endoscopy, and he wanted to proceed. He is NPO this AM. I have ordered stat labs for this AM - Hgb and BMET to ensure stable for a procedure today. If these are okay will proceed this AM. Further recommendations pending the results. Continue IV protonix for now. He agreed.   McIntyre Cellar, MD [facility]

## 2018-08-20 NOTE — Interval H&P Note (Signed)
History and Physical Interval Note:  08/20/2018 9:15 AM  Raymond [name]  has presented today for surgery, with the diagnosis of melena, anemia  The various methods of treatment have been discussed with the patient and family. After consideration of risks, benefits and other options for treatment, the patient has consented to  Procedure(s): ESOPHAGOGASTRODUODENOSCOPY (EGD) WITH PROPOFOL (N/A) as a surgical intervention .  The patient's history has been reviewed, patient examined, no change in status, stable for surgery.  I have reviewed the patient's chart and labs.  Questions were answered to the patient's satisfaction.     Silvano Rusk

## 2018-08-20 NOTE — Transfer of Care (Signed)
Immediate Anesthesia Transfer of Care Note  Patient: Emelio E Manzi  Procedure(s) Performed: ESOPHAGOGASTRODUODENOSCOPY (EGD) WITH PROPOFOL (N/A ) HOT HEMOSTASIS (ARGON PLASMA COAGULATION/BICAP) (N/A )  Patient Location: PACU  Anesthesia Type:MAC  Level of Consciousness: drowsy  Airway & Oxygen Therapy: Patient Spontanous Breathing and Patient connected to nasal cannula oxygen  Post-op Assessment: Report given to RN and Post -op Vital signs reviewed and stable  Post vital signs: Reviewed and stable  Last Vitals:  Vitals Value Taken Time  BP    Temp    Pulse    Resp    SpO2      Last Pain:  Vitals:   08/20/18 0853  TempSrc: Oral  PainSc: 0-No pain      Patients Stated Pain Goal: 0 (16/10/96 0454)  Complications: No apparent anesthesia complications

## 2018-08-20 NOTE — Evaluation (Signed)
Physical Therapy Evaluation Patient Details Name: Raymond [name] MRN: [id number] DOB: 17-Dec-1955 Today's Date: 08/20/2018   History of Present Illness  Raymond [name] is a 63 y.o. male with ESRD on HD, history of hepatitis C (treated, without known cirrhosis), CAD, COPD, PAF, who presented to the hospital initially with shortness of breath and hypokalemia. Underwent emergent dialysis with nephrology after admission. He was noted to have worsening of chronic anemia with Hgb to the 6s, and he endorsed some black stools.  Clinical Impression  Patient presents with mobility close to baseline and currently without PT needs.  Feel safe for d/c and able to mobilize as required for d/c situation and transport to dialysis.  Will sign off.     Follow Up Recommendations No PT follow up    Equipment Recommendations  None recommended by PT    Recommendations for Other Services       Precautions / Restrictions Precautions Precautions: None      Mobility  Bed Mobility Overal bed mobility: Independent                Transfers Overall transfer level: Independent Equipment used: None                Ambulation/Gait   Gait Distance (Feet): 180 Feet Assistive device: None Gait Pattern/deviations: WFL(Within Functional Limits)        Stairs Stairs: Yes Stairs assistance: Supervision Stair Management: Two rails;Alternating pattern Number of Stairs: 10 General stair comments: single rail to ascend, both rails to descend  Wheelchair Mobility    Modified Rankin (Stroke Patients Only)       Balance Overall balance assessment: No apparent balance deficits (not formally assessed)                                           Pertinent Vitals/Pain Pain Assessment: No/denies pain    Home Living Family/patient expects to be discharged to:: Shelter/Homeless                      Prior Function Level of Independence: Independent          Comments: takes bus to HD     Hand Dominance   Dominant Hand: Right    Extremity/Trunk Assessment   Upper Extremity Assessment Upper Extremity Assessment: Overall WFL for tasks assessed    Lower Extremity Assessment Lower Extremity Assessment: Overall WFL for tasks assessed       Communication   Communication: No difficulties  Cognition Arousal/Alertness: Awake/alert Behavior During Therapy: WFL for tasks assessed/performed Overall Cognitive Status: Within Functional Limits for tasks assessed                                        General Comments      Exercises     Assessment/Plan    PT Assessment Patent does not need any further PT services  PT Problem List         PT Treatment Interventions      PT Goals (Current goals can be found in the Care Plan section)  Acute Rehab PT Goals PT Goal Formulation: All assessment and education complete, DC therapy    Frequency     Barriers to discharge        Co-evaluation  AM-PAC PT "6 Clicks" Mobility  Outcome Measure Help needed turning from your back to your side while in a flat bed without using bedrails?: None Help needed moving from lying on your back to sitting on the side of a flat bed without using bedrails?: None Help needed moving to and from a bed to a chair (including a wheelchair)?: None Help needed standing up from a chair using your arms (e.g., wheelchair or bedside chair)?: None Help needed to walk in hospital room?: None Help needed climbing 3-5 steps with a railing? : None 6 Click Score: 24    End of Session   Activity Tolerance: Patient tolerated treatment well Patient left: in bed Nurse Communication: Other (comment)(need for diet order) PT Visit Diagnosis: Muscle weakness (generalized) (M62.81)    Time: 0911-0920 PT Time Calculation (min) (ACUTE ONLY): 9 min   Charges:   PT Evaluation $PT Eval Low Complexity: [place],  PT Acute Rehabilitation Services [phone] 08/20/2018   Raymond [name] 08/20/2018, 1:28 PM

## 2018-08-20 NOTE — Progress Notes (Signed)
PT Cancellation Note  Patient Details Name: Raymond [name] MRN: [id number] DOB: [birth year]   Cancelled Treatment:    Reason Eval/Treat Not Completed: Patient at procedure or test/unavailable; will attempt later as time permits.   Reginia Naas 08/20/2018, 9:04 AM  Magda Kiel, [place] [phone] 08/20/2018

## 2018-08-20 NOTE — Op Note (Signed)
[facility] Patient Name: Raymond [name] Procedure Date : 08/20/2018 MRN: [id number] Attending MD: Gatha Mayer , MD Date of Birth: 1956-06-23 CSN: [id number] Age: 63 Admit Type: Inpatient Procedure:                Upper GI endoscopy Indications:              Heme positive stool, Melena Providers:                Gatha Mayer, MD, Burtis Junes, RN, Charolette Child,                            Technician Referring MD:              Medicines:                Propofol per Anesthesia, Monitored Anesthesia Care Complications:            No immediate complications. Estimated Blood Loss:     Estimated blood loss: none. Procedure:                Pre-Anesthesia Assessment:                           - Prior to the procedure, a History and Physical                            was performed, and patient medications and                            allergies were reviewed. The patient's tolerance of                            previous anesthesia was also reviewed. The risks                            and benefits of the procedure and the sedation                            options and risks were discussed with the patient.                            All questions were answered, and informed consent                            was obtained. Prior Anticoagulants: The patient has                            taken no previous anticoagulant or antiplatelet                            agents. ASA Grade Assessment: III - A patient with                            severe systemic disease. After reviewing the risks  and benefits, the patient was deemed in                            satisfactory condition to undergo the procedure.                           After obtaining informed consent, the endoscope was                            passed under direct vision. Throughout the                            procedure, the patient's blood pressure, pulse, and                             oxygen saturations were monitored continuously. The                            GIF-H190 ([id number]) Olympus gastroscope was                            introduced through the mouth, and advanced to the                            second part of duodenum. The upper GI endoscopy was                            accomplished without difficulty. The patient                            tolerated the procedure well. 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 Coagulation for bleeding prevention using argon       plasma was successful. Estimated blood loss: none.      The exam was otherwise without abnormality.      The cardia and gastric fundus were normal on retroflexion. 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 Treated with argon plasma coagulation                            (APC).                           - The examination was otherwise normal.                           - No specimens collected. Recommendation:           - Return patient to hospital ward for ongoing care.                           -  Clear liquid diet.                           - Not sure these were true AVM's vs red spots - so                            think colonoscopy to evaluate hx ? melena, heme +                            anemia in a man with an indication for warfarin                            (PAF) makes sense. Colonoscopy tomorrow Dr.                            Havery Moros.                           Seems like warfarin (or other anti-coag) Tx not                            going to work well in his case unless he can                            demonstrate compliance. Procedure Code(s):        --- Professional ---                           [id number], Esophagogastroduodenoscopy, flexible,                            transoral; with ablation of tumor(s), polyp(s), or                             other lesion(s) (includes pre- and post-dilation                            and guide wire passage, when performed) Diagnosis Code(s):        --- Professional ---                           B34.193, Angiodysplasia of stomach and duodenum                            without bleeding                           R19.5, Other fecal abnormalities                           K92.1, Melena (includes Hematochezia) CPT copyright 2018 American Medical Association. All rights reserved. The codes documented in this report are preliminary and upon coder review may  be revised to meet current compliance requirements. Gatha Mayer, MD 08/20/2018 10:07:06 AM This report has been signed electronically. Number of Addenda: 0

## 2018-08-20 NOTE — Interval H&P Note (Signed)
History and Physical Interval Note:  08/20/2018 9:26 AM  Raymond [name]  has presented today for surgery, with the diagnosis of melena, anemia  The various methods of treatment have been discussed with the patient and family. After consideration of risks, benefits and other options for treatment, the patient has consented to  Procedure(s): ESOPHAGOGASTRODUODENOSCOPY (EGD) WITH PROPOFOL (N/A) as a surgical intervention .  The patient's history has been reviewed, patient examined, no change in status, stable for surgery.  I have reviewed the patient's chart and labs.  Questions were answered to the patient's satisfaction.     Silvano Rusk

## 2018-08-20 NOTE — Anesthesia Preprocedure Evaluation (Signed)
Anesthesia Evaluation  Patient identified by MRN, date of birth, ID band Patient awake    Reviewed: Allergy & Precautions, NPO status , Patient's Chart, lab work & pertinent test results  Airway Mallampati: I  TM Distance: >3 FB Neck ROM: Full    Dental   Pulmonary Current Smoker,    Pulmonary exam normal        Cardiovascular hypertension, Pt. on medications + CAD  Normal cardiovascular exam     Neuro/Psych    GI/Hepatic (+) Hepatitis -, C  Endo/Other    Renal/GU Dialysis and ESRFRenal disease     Musculoskeletal   Abdominal   Peds  Hematology   Anesthesia Other Findings   Reproductive/Obstetrics                             Anesthesia Physical Anesthesia Plan  ASA: III  Anesthesia Plan: MAC   Post-op Pain Management:    Induction: Intravenous  PONV Risk Score and Plan: 0  Airway Management Planned: Simple Face Mask  Additional Equipment:   Intra-op Plan:   Post-operative Plan:   Informed Consent: I have reviewed the patients History and Physical, chart, labs and discussed the procedure including the risks, benefits and alternatives for the proposed anesthesia with the patient or authorized representative who has indicated his/her understanding and acceptance.       Plan Discussed with: CRNA and Surgeon  Anesthesia Plan Comments:         Anesthesia Quick Evaluation

## 2018-08-20 NOTE — Plan of Care (Signed)
  Problem: Education: Goal: Knowledge of General Education information will improve Description: Including pain rating scale, medication(s)/side effects and non-pharmacologic comfort measures Outcome: Progressing   Problem: Clinical Measurements: Goal: Will remain free from infection Outcome: Progressing   

## 2018-08-20 NOTE — H&P (View-Only) (Signed)
Consultation  Referring Provider:     Florencia Reasons MD Primary Care Physician:  Scot Jun, FNP Primary Gastroenterologist:        Althia Forts Reason for Consultation:     melena         HPI:   Raymond [name] is a 63 y.o. male with ESRD on HD, history of hepatitis C (treated, without known cirrhosis), CAD, COPD, PAF, who presented to the hospital initially with shortness of breath and hypokalemia. Underwent emergent dialysis with nephrology after admission. He was noted to have worsening of chronic anemia with Hgb to the 6s, and he endorsed some black stools. He had some nausea and vomiting within the past week or so, states he "didn't look at it", unsure if that contained any blood. He's also had some "tightness" in his upper abdomen but no significant pain. He has been eating well otherwise. He denies any bright red blood per rectum, but states he has had some dark black stools recently. He thinks he had a bowel movement overnight, but unable to quantify how long he has had black stools or how many he has had. He was started on IV protonix yesterday afternoon. His Hgb improved to 7.6 following a blood transfusion as of yesterday, labs not done for this morning yet. He does not think he has ever had a prior upper endoscopy. He thinks he has had a prior colonoscopy, unclear when or what it showed, thinks it was a long time ago. He does not take any PPI or antacid as outpatient. He denies any NSAID use. He is supposed to be on coumadin for atrial fibrillation but he does not take it. He denies any cardiopulmonary symptoms this morning. He denies any FH of colon cancer or gastric cancer.  He has some mild thrombocytopenia with platelets in the 130s. He had a CT abdomen / pelvis in October 2019 which showed no evidence of underlying cirrhosis. Liver enzymes are normal.   Past Medical History:  Diagnosis Date  . Anemia   . CAD (coronary artery disease)    a. 03/2018: cath showing 20 to 30%  stenosis along the LAD, luminal irregularities along the RCA, and angiographically normal LCx  . COPD (chronic obstructive pulmonary disease) ([place])   . Dyspnea    "with too much fluid"  . ESRD (end stage renal disease) on dialysis [facility])    "MWF; [facility]" (07/22/17)  . Hemodialysis patient ([place])   . Hepatitis C    Still positive s/p liver biopsy at [facility]  and interferon therapy for 6 months. Most recent lab work was on 10/24/12  . Hepatitis C    "took the tx; gone now" (12/05/2016)  Was treated  . History of blood transfusion ~ 2012/2013   "related to my kidneys; blood was low"  . Hypertension   . Substance abuse ([place])   . Thyroid disease     Past Surgical History:  Procedure Laterality Date  . ABDOMINAL AORTOGRAM W/LOWER EXTREMITY N/A 02/08/2018   Procedure: ABDOMINAL AORTOGRAM W/LOWER EXTREMITY;  Surgeon: Conrad Dayton, MD;  Location: [place] CV LAB;  Service: Cardiovascular;  Laterality: N/A;  . AV FISTULA PLACEMENT Left Aug. 2013 ?  . FEMORAL-POPLITEAL BYPASS GRAFT Left 04/11/2018   Procedure: BYPASS GRAFT FEMORAL-POPLITEAL ARTERY LEFT LEG;  Surgeon: Rosetta Posner, MD;  Location: [place];  Service: Vascular;  Laterality: Left;  . LEFT HEART CATH AND CORONARY ANGIOGRAPHY N/A 03/29/2018   Procedure: LEFT HEART CATH AND  CORONARY ANGIOGRAPHY;  Surgeon: Nelva Bush, MD;  Location: [place] CV LAB;  Service: Cardiovascular;  Laterality: N/A;  . LIVER BIOPSY    . ORIF ULNAR FRACTURE Left 05/18/2017   Procedure: OPEN REDUCTION INTERNAL FIXATION (ORIF) ULNAR FRACTURE;  Surgeon: Iran Planas, MD;  Location: [place];  Service: Orthopedics;  Laterality: Left;  . SHUNTOGRAM N/A 12/11/2012   Procedure: Earney Mallet;  Surgeon: Serafina Mitchell, MD;  Location: [facility] CATH LAB;  Service: Cardiovascular;  Laterality: N/A;    Family History  Problem Relation Age of Onset  . Diabetes Father   . Hypertension Father   . Heart disease Father      Social History   Tobacco Use  . Smoking status:  Current Every Day Smoker    Packs/day: 1.00    Years: 25.00    Pack years: 25.00    Types: Cigarettes    Last attempt to quit: 08/01/2011    Years since quitting: 7.0  . Smokeless tobacco: Never Used  . Tobacco comment: he smokes 6 cigarettes a week  Substance Use Topics  . Alcohol use: Yes    Comment: occasionally/ one half pint  . Drug use: Yes    Types: Cocaine    Comment: he used yesterday    Prior to Admission medications   Medication Sig Start Date End Date Taking? Authorizing Provider  acetaminophen (TYLENOL) 325 MG tablet Take 650 mg by mouth every 6 (six) hours as needed for mild pain.    [provider]  amiodarone (PACERONE) 200 MG tablet Take 1 tablet (200 mg total) by mouth daily. 08/13/18   Cherene Altes, MD  amLODipine (NORVASC) 2.5 MG tablet Take 2.5 mg by mouth daily.    [provider]  metoprolol succinate (TOPROL-XL) 50 MG 24 hr tablet Take 50 mg by mouth daily. Take with or immediately following a meal.    [provider]  ondansetron (ZOFRAN) 4 MG tablet Take 1 tablet (4 mg total) by mouth every 8 (eight) hours as needed for nausea or vomiting. Patient not taking: Reported on 08/18/2018 04/03/18   Thurnell Lose, MD  warfarin (COUMADIN) 5 MG tablet Take 1 tablet (5 mg total) by mouth daily at 6 PM. 08/13/18   Cherene Altes, MD    Current Facility-Administered Medications  Medication Dose Route Frequency Provider Last Rate Last Dose  . 0.9 %  sodium chloride infusion (Manually program via Guardrails IV Fluids)   Intravenous Once Florencia Reasons, MD      . 0.9 %  sodium chloride infusion (Manually program via Guardrails IV Fluids)   Intravenous Once Florencia Reasons, MD      . 0.9 %  sodium chloride infusion  100 mL Intravenous PRN Finnigan, Nancy A, DO      . 0.9 %  sodium chloride infusion  100 mL Intravenous PRN Finnigan, Nancy A, DO      . acetaminophen (TYLENOL) tablet 650 mg  650 mg Oral Q6H PRN Florencia Reasons, MD      . amiodarone (PACERONE)  tablet 200 mg  200 mg Oral Daily Florencia Reasons, MD   200 mg at 08/19/18 1057  . bisacodyl (DULCOLAX) suppository 10 mg  10 mg Rectal Daily Florencia Reasons, MD   10 mg at 08/19/18 1351  . calcium gluconate 1 g in sodium chloride 0.9 % 100 mL IVPB  1 g Intravenous Once Florencia Reasons, MD      . Chlorhexidine Gluconate Cloth 2 % PADS 6 each  6  each Topical [id number] Florencia Reasons, MD   6 each at 08/20/18 [phone]  . Darbepoetin Alfa (ARANESP) injection 60 mcg  60 mcg Intravenous Once Finnigan, Nancy A, DO      . heparin injection 1,000 Units  1,000 Units Dialysis PRN Finnigan, Nancy A, DO      . lidocaine (PF) (XYLOCAINE) 1 % injection 5 mL  5 mL Intradermal PRN Finnigan, Nancy A, DO      . lidocaine-prilocaine (EMLA) cream 1 application  1 application Topical PRN Finnigan, Nancy A, DO      . metoprolol succinate (TOPROL-XL) 24 hr tablet 50 mg  50 mg Oral Daily Florencia Reasons, MD   50 mg at 08/19/18 1057  . pantoprazole (PROTONIX) injection 40 mg  40 mg Intravenous Q12H Florencia Reasons, MD   40 mg at 08/20/18 0556  . pentafluoroprop-tetrafluoroeth (GEBAUERS) aerosol 1 application  1 application Topical PRN Finnigan, Nancy A, DO      . polyethylene glycol (MIRALAX / GLYCOLAX) packet 17 g  17 g Oral Daily Florencia Reasons, MD   17 g at 08/19/18 1056  . senna-docusate (Senokot-S) tablet 1 tablet  1 tablet Oral BID Florencia Reasons, MD   1 tablet at 08/19/18 2115   Facility-Administered Medications Ordered in Other Encounters  Medication Dose Route Frequency Provider Last Rate Last Dose  . midazolam (VERSED) 5 MG/5ML injection    Anesthesia Intra-op Sammie Bench, CRNA   2 mg at 04/11/18 1042    Allergies as of 08/18/2018  . (No Known Allergies)     Review of Systems:    As per HPI, otherwise negative    Physical Exam:  Vital signs in last 24 hours: Temp:  [97.8 F (36.6 C)-99.1 F (37.3 C)] 97.8 F (36.6 C) (02/10 0505) Pulse Rate:  [82-89] 83 (02/10 0505) Resp:  [18-21] 21 (02/10 0505) BP: (151-162)/(88-99) 162/99 (02/10 0505) SpO2:   [95 %-99 %] 95 % (02/10 0505) Weight:  [76.7 kg] 76.7 kg (02/09 1601)   General:   Pleasant male in NAD Head:  Normocephalic and atraumatic. Eyes:   No icterus.   Conjunctiva pale Ears:  Normal auditory acuity. Neck:  Supple Lungs:  Respirations even and unlabored. Lungs clear to auscultation bilaterally.   No wheezes, crackles, or rhonchi.  Heart:  Regular rate and rhythm; no MRG Abdomen:  Soft, nondistended, nontender. No appreciable masses or hepatomegaly.  Rectal:  Not performed.  Msk:  Symmetrical without gross deformities.  Extremities:  Without edema. Neurologic:  Alert and  oriented x4;  grossly normal neurologically. Skin:  Intact without significant lesions or rashes. Psych:  Alert and cooperative. Normal affect.  LAB RESULTS: Recent Labs    08/18/18 1501 08/19/18 0528  WBC 3.7* 3.9*  HGB 6.9* 7.6*  HCT 22.4* 23.9*  PLT 135* 133*   BMET Recent Labs    08/18/18 1501 08/19/18 0528  NA 142 138  K 6.2* 4.4  CL 101 98  CO2 20* 25  GLUCOSE 84 80  BUN 120* 47*  CREATININE 22.27* 11.44*  CALCIUM 8.0* 7.7*   LFT No results for input(s): PROT, ALBUMIN, AST, ALT, ALKPHOS, BILITOT, BILIDIR, IBILI in the last 72 hours. PT/INR Recent Labs    08/18/18 1728 08/19/18 0528  LABPROT 15.8* 15.4*  INR 1.27 1.23    STUDIES: Dg Chest 2 View  Result Date: 08/18/2018 CLINICAL DATA:  Pt reports he last went to dialysis was 08-11-2018. Pt reports he does not go because he is homeless and he is not  going through all that. Pt has bilateral lower extremity swelling. EXAM: CHEST - 2 VIEW COMPARISON:  08/15/2018 FINDINGS: The heart is enlarged. Lungs are free of focal consolidations and pleural effusions. No pulmonary edema. IMPRESSION: Cardiomegaly. Electronically Signed   By: Nolon Nations M.D.   On: 08/18/2018 15:45        Impression / Plan:   63 y/o male with h/o hep C (eradicated - no cirrhosis noted) CAD, COPD, PAF not on anticoagulation, ESRD on HD, admitted with  hyperkalemia and shortness of breath, found to be anemic with recent dark stools concerning for melena. Stool is heme positive. He is a very poor historian, difficult to say duration and severity of symptoms. I don't believe he has had any significant GI bleeding since he has been here per nursing. He is hemodynamically stable at this time on IV protonix.   I discussed the situation with him, concerning for possible upper GI bleed. I recommend an upper endoscopy to further evaluate his anemia and symptoms, especially if he is considering resuming his anticoagulation. I discussed risks / benefits of anesthesia and endoscopy, and he wanted to proceed. He is NPO this AM. I have ordered stat labs for this AM - Hgb and BMET to ensure stable for a procedure today. If these are okay will proceed this AM. Further recommendations pending the results. Continue IV protonix for now. He agreed.   [place] Cellar, MD [facility] Gastroenter[facility]

## 2018-08-20 NOTE — Progress Notes (Addendum)
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 Treated with argon plasma coagulation  )  no cos. For HD tomorrow on schedule .  Objective Vital signs in last 24 hours: Vitals:   08/20/18 0816 08/20/18 0853 08/20/18 0950 08/20/18 1000  BP: (!) 180/100 (!) 184/103 138/66 119/75  Pulse: 89 82 88 87  Resp: _0 (!) 22  Temp: 97.7 F (36.5 C) 98.6 F (37 C) 98.5 F (36.9 C)   TempSrc: Oral Oral Oral   SpO2: 98% 100% 100% 100%  Weight:  76.7 kg    Height:  _1  (1.727 m)     Weight change:   Physical Exam: General: Alert Chronically ill appearing AAM  NAD  Heart: RRR, No M,R, G  Lungs: CTA  bilat. Abdomen: BS pos , soft , NT, ND Extremities: Trace bipedal edema  Dialysis Access: Pos bruits  LUA AVF    [facility] TTS 4hrs 450/800  74kg  2K/2.5Ca LU AVF Hep 2000 units Parsabiv 58m qHD mircera 1085m q2wks, last 22545mmircera given on 06/05/18 Calcitriol 1.5mg34mD  Problem/Plan: 1. Hyperkalemia - due to non compliance.  Resolved with HD. K 4.6. 2. SOB/Volume overload 2/2 noncompliance with HD. Resolved post HD 02/08 with 3 l uf   CXR unremarkable. Volume status stable. Per wtrs 2.6 kg > edw , hd uf tomor as bp allows   3. ESRD -on HD TTS. NO HEPARIN  With GI BLD/ HD on admit  after missing for 1 week. Has been non compliant lately due to lack of housing.  Encouraged compliance and discussed consequences of missing HD.  SW consulted but patient declined additional assistance offered. Will f/u with OP SW about housing.   4. 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 Treated with argon plasma coagulation . Gi following May need prbc's w/ HD tomorrow.  5. Anemia of CKD/ GI Bld - Hgb 7.2. Tsat 23% start Iron load on hd( 5 days on  hd then 50mg49mekly )   Aranesp 60  incr to 100  . 6. Secondary hyperparathyroidism -  with last alb noted 2.6 ( 02/03) Ca Corec  Ci 8.8   Phos high as OP. Continue  binders, VDRA.  Unable to give parsabiv b/c  not available in hospital./ follow up renal labs pre hd  7. HTN- BP elevated.  Continue home meds=. On Met 50mg 58may  8. Nutrition - Renal diet w/fluid restrictions 9. Homelessness - SW consulting.  Patient declined additional assistance they offered.  Will f/u with SW at OP unit.  10. CAD 11. PVD 12. Hep C - s/p interferon therapy   David [name] Beeper [phone],10:33 AM  LOS: 0 days   Pt seen, examined, agree w assess/plan as above with additions as indicated.  [facility] pager [phone] [phone], 11:40 AM     Labs: Basic Metabolic Panel: Recent Labs  Lab 08/18/18 1501 08/19/18 0528 08/20/18 0800  NA 142 138 142  K 6.2* 4.4 4.6  CL 101 98 104  CO2 20* 25 23  GLUCOSE 84 80 84  BUN 120* 47* 62*  CREATININE 22.27* 11.44* 14.14*  CALCIUM 8.0* 7.7* 7.7*   Liver Function Tests: No results for input(s): AST, ALT, ALKPHOS, BILITOT, PROT, ALBUMIN in the last 168 hours. No results for input(s): LIPASE, AMYLASE in the last 168 hours. No  results for input(s): AMMONIA in the last 168 hours. CBC: Recent Labs  Lab 08/15/18 0440 08/18/18 1501 08/19/18 0528 08/20/18 0800  WBC 2.2* 3.7* 3.9*  --   NEUTROABS  --   --  2.3  --   HGB 7.8* 6.9* 7.6* 7.3*  HCT 26.0* 22.4* 23.9* 24.1*  MCV 100.0 101.4* 94.1  --   PLT 96* 135* 133*  --    Cardiac Enzymes: No results for input(s): CKTOTAL, CKMB, CKMBINDEX, TROPONINI in the last 168 hours. CBG: No results for input(s): GLUCAP in the last 168 hours.  Studies/Results: Dg Chest 2 View  Result Date: 08/18/2018 CLINICAL DATA:  Pt reports he last went to dialysis was 08-11-2018. Pt reports he does not go because he is homeless and he is not going through all that. Pt has bilateral lower extremity swelling. EXAM: CHEST - 2 VIEW COMPARISON:  08/15/2018 FINDINGS: The heart is enlarged. Lungs are  free of focal consolidations and pleural effusions. No pulmonary edema. IMPRESSION: Cardiomegaly. Electronically Signed   By: Nolon Nations M.D.   On: 08/18/2018 15:45   Medications: . sodium chloride    . sodium chloride    . calcium gluconate 1 GM IVPB     . sodium chloride   Intravenous Once  . sodium chloride   Intravenous Once  . amiodarone  200 mg Oral Daily  . bisacodyl  10 mg Rectal Daily  . Chlorhexidine Gluconate Cloth  6 each Topical Q0600  . darbepoetin (ARANESP) injection - DIALYSIS  60 mcg Intravenous Once  . metoprolol succinate  50 mg Oral Daily  . pantoprazole (PROTONIX) IV  40 mg Intravenous Q12H  . polyethylene glycol  17 g Oral Daily  . senna-docusate  1 tablet Oral BID

## 2018-08-20 NOTE — Anesthesia Postprocedure Evaluation (Signed)
Anesthesia Post Note  Patient: Demarea E Swetz  Procedure(s) Performed: ESOPHAGOGASTRODUODENOSCOPY (EGD) WITH PROPOFOL (N/A ) HOT HEMOSTASIS (ARGON PLASMA COAGULATION/BICAP) (N/A )     Patient location during evaluation: PACU Anesthesia Type: MAC Level of consciousness: awake and alert Pain management: pain level controlled Vital Signs Assessment: post-procedure vital signs reviewed and stable Respiratory status: spontaneous breathing, nonlabored ventilation, respiratory function stable and patient connected to nasal cannula oxygen Cardiovascular status: stable and blood pressure returned to baseline Postop Assessment: no apparent nausea or vomiting Anesthetic complications: no    Last Vitals:  Vitals:   08/20/18 0950 08/20/18 1000  BP: 138/66 119/75  Pulse: 88 87  Resp: 20 (!) 22  Temp: 36.9 C   SpO2: 100% 100%    Last Pain:  Vitals:   08/20/18 1000  TempSrc:   PainSc: 0-No pain                 Katrisha Segall DAVID

## 2018-08-21 ENCOUNTER — Encounter (HOSPITAL_COMMUNITY)
Admission: EM | Discharge status: Against Medical Advice | Payer: Self-pay | Source: Home / Self Care | Attending: Internal Medicine

## 2018-08-21 ENCOUNTER — Inpatient Hospital Stay (HOSPITAL_COMMUNITY): Payer: Medicare Other | Admitting: Anesthesiology

## 2018-08-21 ENCOUNTER — Encounter (HOSPITAL_COMMUNITY): Payer: Self-pay | Admitting: Anesthesiology

## 2018-08-21 DIAGNOSIS — D126 Benign neoplasm of colon, unspecified: Secondary | ICD-10-CM

## 2018-08-21 HISTORY — PX: POLYPECTOMY: SHX5525

## 2018-08-21 HISTORY — PX: COLONOSCOPY WITH PROPOFOL: SHX5780

## 2018-08-21 LAB — RENAL FUNCTION PANEL
ANION GAP: 16 — AB (ref 5–15)
Albumin: 2.8 g/dL — ABNORMAL LOW (ref 3.5–5.0)
BUN: 71 mg/dL — ABNORMAL HIGH (ref 8–23)
CO2: 20 mmol/L — ABNORMAL LOW (ref 22–32)
Calcium: 8.2 mg/dL — ABNORMAL LOW (ref 8.9–10.3)
Chloride: 102 mmol/L (ref 98–111)
Creatinine, Ser: 15.91 mg/dL — ABNORMAL HIGH (ref 0.61–1.24)
GFR calc Af Amer: 3 mL/min — ABNORMAL LOW (ref >60)
GFR calc non Af Amer: 3 mL/min — ABNORMAL LOW (ref >60)
GLUCOSE: 74 mg/dL (ref 70–99)
Phosphorus: 5.4 mg/dL — ABNORMAL HIGH (ref 2.5–4.6)
Potassium: 5.6 mmol/L — ABNORMAL HIGH (ref 3.5–5.1)
Sodium: 138 mmol/L (ref 135–145)

## 2018-08-21 LAB — CBC
HCT: 25 % — ABNORMAL LOW (ref 39.0–52.0)
Hemoglobin: 7.6 g/dL — ABNORMAL LOW (ref 13.0–17.0)
MCH: 29.2 pg (ref 26.0–34.0)
MCHC: 30.4 g/dL (ref 30.0–36.0)
MCV: 96.2 fL (ref 80.0–100.0)
Platelets: 156 10*3/uL (ref 150–400)
RBC: 2.6 MIL/uL — ABNORMAL LOW (ref 4.22–5.81)
RDW: 18.8 % — ABNORMAL HIGH (ref 11.5–15.5)
WBC: 4.1 10*3/uL (ref 4.0–10.5)
nRBC: 0 % (ref 0.0–0.2)

## 2018-08-21 SURGERY — COLONOSCOPY WITH PROPOFOL
Anesthesia: Monitor Anesthesia Care

## 2018-08-21 MED ORDER — LIDOCAINE HCL (CARDIAC) PF 100 MG/5ML IV SOSY
PREFILLED_SYRINGE | INTRAVENOUS | Status: DC | PRN
  Administered 2018-08-21 (×2): 50 mg via INTRAVENOUS

## 2018-08-21 MED ORDER — DARBEPOETIN ALFA 100 MCG/0.5ML IJ SOSY
PREFILLED_SYRINGE | INTRAMUSCULAR | Status: AC
  Administered 2018-08-21: 100 ug via INTRAVENOUS
  Filled 2018-08-21: qty 0.5

## 2018-08-21 MED ORDER — PROPOFOL 500 MG/50ML IV EMUL
INTRAVENOUS | Status: DC | PRN
  Administered 2018-08-21: 150 ug/kg/min via INTRAVENOUS

## 2018-08-21 MED ORDER — METOPROLOL SUCCINATE ER 50 MG PO TB24
50.0000 mg | ORAL_TABLET | Freq: Once | ORAL | Status: AC
  Administered 2018-08-21: 50 mg via ORAL
  Filled 2018-08-21: qty 1

## 2018-08-21 MED ORDER — AMLODIPINE BESYLATE 2.5 MG PO TABS: 2.5000 mg | ORAL_TABLET | Freq: Once | ORAL | Status: DC

## 2018-08-21 MED ORDER — AMLODIPINE BESYLATE 2.5 MG PO TABS
2.5000 mg | ORAL_TABLET | Freq: Once | ORAL | Status: AC
  Administered 2018-08-21: 2.5 mg via ORAL
  Filled 2018-08-21: qty 1

## 2018-08-21 MED ORDER — PROPOFOL 10 MG/ML IV BOLUS
INTRAVENOUS | Status: DC | PRN
  Administered 2018-08-21: 20 mg via INTRAVENOUS
  Administered 2018-08-21: 40 mg via INTRAVENOUS

## 2018-08-21 SURGICAL SUPPLY — 22 items

## 2018-08-21 NOTE — Op Note (Addendum)
[facility] Patient Name: Raymond [name] Procedure Date : 08/21/2018 MRN: [id number] Attending MD: Willaim Rayas. Adela Lank , MD Date of Birth: 03-01-1956 CSN: [id number] Age: 63 Admit Type: Inpatient Procedure:                Colonoscopy Indications:              history of dark stools that are heme (+), anemia.                            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 Colonoscopy to ensure                            no lower tract pathology. History of coumadin use                            (not taking) Providers:                Willaim Rayas. Adela Lank, MD, April Holding, RN, Harrington Challenger, Technician Referring MD:              Medicines:                Monitored Anesthesia Care Complications:            No immediate complications. Estimated blood loss:                            Minimal. Estimated Blood Loss:     Estimated blood loss was minimal. Procedure:                Pre-Anesthesia Assessment:                           - Prior to the procedure, a History and Physical                            was performed, and patient medications and                            allergies were reviewed. The patient's tolerance of                            previous anesthesia was also reviewed. The risks                            and benefits of the procedure and the sedation                            options and risks were discussed with the patient.  All questions were answered, and informed consent                            was obtained. Prior Anticoagulants: The patient has                            taken no previous anticoagulant or antiplatelet                            agents. ASA Grade Assessment: III - A patient with                            severe systemic disease. After reviewing the risks                            and benefits, the patient was  deemed in                            satisfactory condition to undergo the procedure.                           After obtaining informed consent, the colonoscope                            was passed under direct vision. Throughout the                            procedure, the patient's blood pressure, pulse, and                            oxygen saturations were monitored continuously. The                            CF-HQ190L ([id number]) Olympus colonoscope was                            introduced through the anus and advanced to the the                            cecum, identified by appendiceal orifice and                            ileocecal valve. The colonoscopy was performed                            without difficulty. The patient tolerated the                            procedure well. The quality of the bowel                            preparation was adequate. The ileocecal valve,  appendiceal orifice, and rectum were photographed. Scope In: 10:05:05 AM Scope Out: 10:28:02 AM Scope Withdrawal Time: 0 hours 16 minutes 42 seconds  Total Procedure Duration: 0 hours 22 minutes 57 seconds  Findings:      The perianal and digital rectal examinations were normal.      A 3 mm polyp was found in the ascending colon. The polyp was sessile.       The polyp was removed with a cold snare. Resection and retrieval were       complete.      A 5 mm polyp was found in the transverse colon. The polyp was sessile.       The polyp was removed with a cold snare. Resection and retrieval were       complete.      A few small-mouthed diverticula were found in the transverse colon and       left colon.      Internal hemorrhoids were found during retroflexion.      The exam was otherwise without abnormality. Attempts to intubate the       ileum were made however due to angulated turn at the valve and looping       this was not achieved. Impression:               - One 3 mm  polyp in the ascending colon, removed                            with a cold snare. Resected and retrieved.                           - One 5 mm polyp in the transverse colon, removed                            with a cold snare. Resected and retrieved.                           - Diverticulosis in the transverse colon and in the                            left colon.                           - Internal hemorrhoids.                           - The examination was otherwise normal.                           No cause for anemia / symptoms on this exam, which                            could be due to small bowel AVMs noted on EGD.                            Moving forward, I would monitor for recurrence of  symptoms post ablation of AVMs. If they recur                            consider capsule endoscopy. I am concerned about                            anticoagulation in this patient when outpatient if                            he cannot prove reliable follow up, with a history                            of GI bleeding / anemia. Recommendation:           - Return patient to hospital ward for ongoing care.                           - Advance diet as tolerated.                           - Continue present medications.                           - Await pathology results.                           - Trend Hgb and monitor for recurrent bleeding                           - GI service will reassess the patient tomorrow AM Procedure Code(s):        --- Professional ---                           [phone], Colonoscopy, flexible; with removal of                            tumor(s), polyp(s), or other lesion(s) by snare                            technique Diagnosis Code(s):        --- Professional ---                           D12.2, Benign neoplasm of ascending colon                           D12.3, Benign neoplasm of transverse colon (hepatic                             flexure or splenic flexure)                           K64.8, Other hemorrhoids                           K92.2, Gastrointestinal hemorrhage, unspecified  K57.30, Diverticulosis of large intestine without                            perforation or abscess without bleeding CPT copyright 2018 American [employer] Association. All rights reserved. The codes documented in this report are preliminary and upon coder review may  be revised to meet current compliance requirements. Viviann Spare P. Gaelan Glennon, MD 08/21/2018 10:36:02 AM This report has been signed electronically. Number of Addenda: 0

## 2018-08-21 NOTE — Progress Notes (Signed)
PROGRESS NOTE  Raymond [name] GQQ:[id number] DOB: 05/06/1956 DOA: 08/18/2018 PCP: Scot Jun, FNP  HPI/Recap of past 24 hours:   Returned from colonoscopy, no new complaints  he denies ab pain, no n/v. No overt bleed, no fever He is to have dialysis today  Poor historian  Assessment/Plan: Active Problems:   ESRD (end stage renal disease) on dialysis ([place])   Homeless   Non-compliance with renal dialysis ([place])   Anemia due to chronic kidney disease, on chronic dialysis ([facility])   Anemia   H/O medication noncompliance   Polysubstance (excluding opioids) dependence ([place])   Melena   Benign neoplasm of colon  ESRD missing HD (presenting symptom) Presented with sob, hyperkalemia on presentation S/p Urgent HD on admission  Plan per nephrology  Anemia Acute on chronic  Melena, + FOBT S/p one unit prbc during dialysis on admission GI consulted, S/p EGD on 2/9+ duodenal AVM s/p APC, s/p colonoscopy on 2/10+ internal hemorrhoids  Per gi if melena persist , may consider capsule study GI input appreciated, will follow Gi recommendation regarding anticoagulation   PAF Currently in sinus rhythm Suppose to be on betablocker, amiodarone and coumadin He does not want to take coumadin will discuss with pharmacy regarding eliquis if he is cleared to restart anticoagulation by GI  PAD s/p bypass ( Left femoral to below-knee popliteal bypass with reverse great saphenous vein) in 03/2018 Suppose to be on asa 81 and statin per vascular surgery d/c summary in 03/2018 He reports he is suppose to follow up with Dr Donnetta Hutching  COPD, not on home o2, no hypoxia, no wheezing Nebs prn  Aortic aneurysm CTa noted ascending thoracic aortic aneurysm measuring 4.4 cm at the level of the main pulmonary artery, which was felt to be unchanged since a CT Dec 2018- will need follow-up annualimaging by CTA or MRA as outpt  H/o polysubstance abuse (alcohol and cocaine) He is interested in inpatient  detox facility, but was told he does not qualify due to on dialysis  Smoker: smoking cessation education  Alcohol use: denies h/o alcohol withdrawal  Homeless Social worker consult  DVT prophylaxis: scd's  Consultants: nephrology and gi  Code Status: full   Family Communication:  Patient   Disposition: need GI clearance , need to discuss anticoagulation before discharge   Procedures:  Urgent HD, back on regular schedule TTS  EGD on 2/9  Colonoscopy on 2/10  Antibiotics:  none   Objective: BP (!) 161/89 (BP Location: Right Arm)   Pulse 72   Temp 98.1 F (36.7 C) (Oral)   Resp 18   Ht 5\' 8"  (1.727 m)   Wt 76.7 kg   SpO2 100%   BMI 25.71 kg/m   Intake/Output Summary (Last 24 hours) at 08/21/2018 1222 Last data filed at 08/21/2018 1018 Gross per 24 hour  Intake 2070 ml  Output 0 ml  Net 2070 ml   Filed Weights   08/19/18 1601 08/20/18 0853 08/20/18 2053  Weight: 76.7 kg 76.7 kg 76.7 kg    Exam: Patient is examined daily including today on 08/21/2018, exams remain the same as of yesterday except that has changed    General:  NAD  Cardiovascular: RRR  Respiratory: CTABL  Abdomen: Soft/ND/NT, positive BS  Musculoskeletal: No Edema  Neuro: alert, oriented   Data Reviewed: Basic Metabolic Panel: Recent Labs  Lab 08/15/18 0440 08/18/18 1501 08/19/18 0528 08/20/18 0800  NA 136 142 138 142  K 4.9 6.2* 4.4 4.6  CL 97* 101  98 104  CO2 23 20* 25 23  GLUCOSE 74 84 80 84  BUN 64* 120* 47* 62*  CREATININE 16.04* 22.27* 11.44* 14.14*  CALCIUM 7.5* 8.0* 7.7* 7.7*   Liver Function Tests: No results for input(s): AST, ALT, ALKPHOS, BILITOT, PROT, ALBUMIN in the last 168 hours. No results for input(s): LIPASE, AMYLASE in the last 168 hours. No results for input(s): AMMONIA in the last 168 hours. CBC: Recent Labs  Lab 08/15/18 0440 08/18/18 1501 08/19/18 0528 08/20/18 0800  WBC 2.2* 3.7* 3.9*  --   NEUTROABS  --   --  2.3  --   HGB 7.8*  6.9* 7.6* 7.3*  HCT 26.0* 22.4* 23.9* 24.1*  MCV 100.0 101.4* 94.1  --   PLT 96* 135* 133*  --    Cardiac Enzymes:   No results for input(s): CKTOTAL, CKMB, CKMBINDEX, TROPONINI in the last 168 hours. BNP (last 3 results) No results for input(s): BNP in the last 8760 hours.  ProBNP (last 3 results) No results for input(s): PROBNP in the last 8760 hours.  CBG: No results for input(s): GLUCAP in the last 168 hours.  No results found for this or any previous visit (from the past 240 hour(s)).   Studies: No results found.  Scheduled Meds: . sodium chloride   Intravenous Once  . sodium chloride   Intravenous Once  . amiodarone  200 mg Oral Daily  . Chlorhexidine Gluconate Cloth  6 each Topical Q0600  . darbepoetin (ARANESP) injection - DIALYSIS  100 mcg Intravenous Q Tue-HD  . metoprolol succinate  50 mg Oral Daily  . polyethylene glycol  17 g Oral Daily  . senna-docusate  1 tablet Oral BID    Continuous Infusions: . sodium chloride    . sodium chloride    . ferric gluconate (FERRLECIT/NULECIT) IV       Time spent: 39mins,  I have personally reviewed and interpreted on  08/21/2018 daily labs, tele strips, imagings as discussed above under date review session and assessment and plans.  I reviewed all nursing notes, pharmacy notes, consultant notes,  vitals, pertinent old records  I have discussed plan of care as described above with RN , patient  on 08/21/2018   Florencia Reasons MD, PhD  [facility] Pager [phone]. If 7PM-7AM, please contact night-coverage at [email or web address], password [facility] 08/21/2018, 12:22 PM  LOS: 1 day

## 2018-08-21 NOTE — Progress Notes (Signed)
Call placed to Dr. Fransisco Beau, advised BP 193/95. States ok for patient to procedure with colonoscopy.

## 2018-08-21 NOTE — Interval H&P Note (Signed)
History and Physical Interval Note:  08/21/2018 10:02 AM  Raymond [name]  has presented today for surgery, with the diagnosis of melena  The various methods of treatment have been discussed with the patient and family. After consideration of risks, benefits and other options for treatment, the patient has consented to  Procedure(s): COLONOSCOPY WITH PROPOFOL (N/A) as a surgical intervention .  The patient's history has been reviewed, patient examined, no change in status, stable for surgery.  I have reviewed the patient's chart and labs.  Questions were answered to the patient's satisfaction.     [place]

## 2018-08-21 NOTE — Progress Notes (Addendum)
Subjective:  On hd , no cos sp colonoscopy today ,tells me =" think will get into Outpt program to help him so not homeless"    Objective Vital signs in last 24 hours: Vitals:   08/21/18 0935 08/21/18 1035 08/21/18 1050 08/21/18 1104  BP: (!) 193/95 (!) 164/65 (!) 157/78 (!) 161/89  Pulse:  78  72  Resp:  20 (!) 21 18  Temp:    98.1 F (36.7 C)  TempSrc:    Oral  SpO2:  100% 100%   Weight:      Height:       Weight change: 0 kg  Physical Exam: General:on hd Alert Chronically ill appearing AAM  NAD  Heart: RRR, No M,R, G  Lungs: CTA  bilat. Abdomen: BS pos , soft , NT, ND Extremities: Trace bipedal edema  Dialysis Access: Patent on hd  LUA AVF     CXR x 4 - no edema  CT chest - minimal GG changes c/w mild edema perhaps   [facility] TTS 4hrs 450/800  74kg  2K/2.5Ca LU AVF Hep 2000 units Parsabiv 56m qHD mircera 1050m q2wks, last 22551mmircera given on 06/05/18 Calcitriol 1.5mg28mD  Problem/Plan: 1. ESRD -on HD TTS. NO HEPARIN  With GI BLD/HD on admit  after missing for 1 week. Has been non compliant lately due to lack of housing. Encouraged compliance and discussed consequences of missing HD. SW consulted but patient declined additional assistance offered. Will f/u with OP SW about housing.   2. 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 Treated with argon plasma coagulation . Coloscopy today=  Polyps / int hemorrhoids/ Diverticulosis ,no active bleed .Gi following / today May need prbc's w/ HD tomorrow 3. Volume - remains close to dry wt, no vol excess on exam. CXR's all neg for edema.  4. Hyperkalemia - resolved w HD 5. Anemia CKD+ ABLA -Hgb 7.3  02/10 . Tsat 23% start Iron load on hd( 5 days on  hd then 50mg17mekly )   Aranesp 60  incr to 100 6. Secondary hyperparathyroidism - with last alb 2.6 ( 02/03) Ca Corec  Ci 8.8  Phos high as OP. Continue binders, VDRA. Unable to give parsabiv b/c  not available in  hospital./ follow up renal labs pre hd  7. HTN-BP elevated. Continue home meds=. On Met 50mg 66may / hd today  8. Nutrition -Renal diet w/fluid restrictions 9. Homelessness - SW consulting. Patient declined additional assistance they offered. Will f/u with SW at OP unit.  10. CAD 11. PVD 12. Hep C - s/p interferon therapy  David [name] [id number],1:04 PM  LOS: 1 day   Pt seen, examined, agree w assess/plan as above with additions as indicated.  [facility] pager [phone] [phone], 2:13 PM     Labs: Basic Metabolic Panel: Recent Labs  Lab 08/18/18 1501 08/19/18 0528 08/20/18 0800  NA 142 138 142  K 6.2* 4.4 4.6  CL 101 98 104  CO2 20* 25 23  GLUCOSE 84 80 84  BUN 120* 47* 62*  CREATININE 22.27* 11.44* 14.14*  CALCIUM 8.0* 7.7* 7.7*   Liver Function Tests: No results for input(s): AST, ALT, ALKPHOS, BILITOT, PROT, ALBUMIN in the last 168 hours. No results for input(s): LIPASE, AMYLASE in the last 168 hours. No results for input(s): AMMONIA in the last 168 hours. CBC:  Recent Labs  Lab 08/15/18 0440 08/18/18 1501 08/19/18 0528 08/20/18 0800  WBC 2.2* 3.7* 3.9*  --   NEUTROABS  --   --  2.3  --   HGB 7.8* 6.9* 7.6* 7.3*  HCT 26.0* 22.4* 23.9* 24.1*  MCV 100.0 101.4* 94.1  --   PLT 96* 135* 133*  --    Cardiac Enzymes: No results for input(s): CKTOTAL, CKMB, CKMBINDEX, TROPONINI in the last 168 hours. CBG: No results for input(s): GLUCAP in the last 168 hours.  Studies/Results: No results found. Medications: . sodium chloride    . sodium chloride    . ferric gluconate (FERRLECIT/NULECIT) IV     . sodium chloride   Intravenous Once  . sodium chloride   Intravenous Once  . amiodarone  200 mg Oral Daily  . Chlorhexidine Gluconate Cloth  6 each Topical Q0600  . darbepoetin (ARANESP) injection - DIALYSIS  100 mcg Intravenous Q Tue-HD  . metoprolol  succinate  50 mg Oral Daily  . polyethylene glycol  17 g Oral Daily  . senna-docusate  1 tablet Oral BID

## 2018-08-21 NOTE — Anesthesia Postprocedure Evaluation (Signed)
Anesthesia Post Note  Patient: Raymond [name]  Procedure(s) Performed: COLONOSCOPY WITH PROPOFOL (N/A ) POLYPECTOMY     Patient location during evaluation: PACU Anesthesia Type: MAC Level of consciousness: awake and alert Pain management: pain level controlled Vital Signs Assessment: post-procedure vital signs reviewed and stable Respiratory status: spontaneous breathing, nonlabored ventilation and respiratory function stable Cardiovascular status: stable and blood pressure returned to baseline Anesthetic complications: no    Last Vitals:  Vitals:   08/21/18 1050 08/21/18 1104  BP: (!) 157/78 (!) 161/89  Pulse:  72  Resp: (!) 21 18  Temp:  36.7 C  SpO2: 100%     Last Pain:  Vitals:   08/21/18 1104  TempSrc: Oral  PainSc:                  [name]

## 2018-08-21 NOTE — Progress Notes (Signed)
BP 208/107 in endo pre procedure for colonoscopy today. Per Dr. Fransisco Beau, patient to take AM BP meds.

## 2018-08-21 NOTE — Transfer of Care (Signed)
Immediate Anesthesia Transfer of Care Note  Patient: Raymond [name]  Procedure(s) Performed: COLONOSCOPY WITH PROPOFOL (N/A ) POLYPECTOMY  Patient Location: PACU  Anesthesia Type:MAC  Level of Consciousness: sedated  Airway & Oxygen Therapy: Patient Spontanous Breathing and Patient connected to nasal cannula oxygen  Post-op Assessment: Report given to RN and Post -op Vital signs reviewed and stable  Post vital signs: Reviewed and stable  Last Vitals:  Vitals Value Taken Time  BP    Temp    Pulse 81 08/21/2018 10:33 AM  Resp 17 08/21/2018 10:33 AM  SpO2 97 % 08/21/2018 10:33 AM  Vitals shown include unvalidated device data.  Last Pain:  Vitals:   08/21/18 0822  TempSrc: Oral  PainSc: 0-No pain      Patients Stated Pain Goal: 0 (26/33/35 4562)  Complications: No apparent anesthesia complications

## 2018-08-22 ENCOUNTER — Encounter (HOSPITAL_COMMUNITY): Payer: Self-pay | Admitting: Gastroenterology

## 2018-08-22 LAB — CBC
HCT: 27.5 % — ABNORMAL LOW (ref 39.0–52.0)
HEMOGLOBIN: 8.6 g/dL — AB (ref 13.0–17.0)
MCH: 30.2 pg (ref 26.0–34.0)
MCHC: 31.3 g/dL (ref 30.0–36.0)
MCV: 96.5 fL (ref 80.0–100.0)
Platelets: 163 10*3/uL (ref 150–400)
RBC: 2.85 MIL/uL — ABNORMAL LOW (ref 4.22–5.81)
RDW: 18.6 % — ABNORMAL HIGH (ref 11.5–15.5)
WBC: 3.7 10*3/uL — ABNORMAL LOW (ref 4.0–10.5)
nRBC: 0 % (ref 0.0–0.2)

## 2018-08-22 NOTE — Discharge Summary (Signed)
Physician Discharge Summary  Raymond [name] PZW:[id number] DOB: [birth year] DOA: 08/18/2018  PCP: Scot Jun, FNP  Admit date: 08/18/2018 Discharge date: 08/22/2018  Admitted From: ER Disposition:  PT LEFT AMA  Recommendations for Outpatient Follow-up:  1. Follow up with PCP in 1-2 weeks 2. Please obtain BMP/CBC in one week 3. Please follow up on the following pending results:   Discharge Condition: AMA CODE STATUS:FULL  Brief/Interim Summary: 63 y.o. m w hx of  COPD, mild nonobstructive CAD by cath in 03/2018, h/o PAD s/p bypass surgery in 03/2018, ESRD on HD (non compliant with HD), anemia, paroxysmal afib does not appear to taking coumadin consistently ( as per chart review, his INR mostly are subtherapeutic), HTN,  polysubstance use and intermittent alcohol binging, treated hepatitis C who presented to the ED 08/18/18 with SOB. He appeared to be very poor historian not able to get reliable history.  He reports last dialysis was August 11, 2018 which was when he was in the hospital.  ED course: He has no hypoxia at rest, denies chest pain , blood pressure is elevated, he is in sinus rhythm.  Labs showed potassium 6.2, hgb 6.9, checks x-ray no acute findings, EKG with sinus rhythm no acute ST-T changes.  EDP ordered one dose of calcium gluconate, EDP called nephrologist  for urgent dialysis, hospitalist called for admission. Patient was admitted, he was seen by nephrologist, gastroenterologist. Patient underwent hemodialysis.  EGD 'EGD with small suspected AVMs in the duodenum which were treated"  He underwent Colonoscopy 08/21/18 and found to have polyps in ascending colon, transverse colon, diverticulosis, internal hemorrhoids otherwise normal exam.  Polyp was removed.  At this point GI recommended "- Advance diet as tolerated. - Continue present medications. - Await pathology results- GI to call patient once report is back. - Trend Hgb and monitor for recurrent bleeding - GI  service will reassess the patient tomorrow AM 08/22/18"  Discharge Diagnoses:  Active Problems:   ESRD (end stage renal disease) on dialysis ([place])   Homeless   Non-compliance with renal dialysis ([place])   Anemia due to chronic kidney disease, on chronic dialysis ([facility])   Acute on chronic anemia   H/O medication noncompliance   Polysubstance (excluding opioids) dependence ([facility])   Melena   Benign neoplasm of colon  ESRD missing HD (presenting symptom): Presented with sob, hyperkalemiaon presentation S/p urgent HD on admission . SEEN BY nephrology, cont o/p HD.  Anemia:Acute on chronic. Melena, + FOBT. S/p one unit prbc during dialysis on admission. GI consulted: S/p EGD on 2/9+ duodenal AVM s/p APC, s/p colonoscopy on 2/11+ internal hemorrhoids. BM clay colored.  HB is improved to 8.6 gm.  PAF: Currently in sinus rhythm. chads2vasc of 3. Supposed to be on betablocker, amiodarone and coumadin.He does not want to take coumadin. discussed with pharmacy regarding eliquis low dose if he is cleared to restart anticoagulation by GI.  PAD s/p bypass(Left femoral to below-knee popliteal bypass with reverse great saphenous vein) in 03/2018.Supposed to be on asa 81 and statin per vascular surgery d/c summary in 03/2018. He reports he is suppose to follow up with Dr Early  COPD, not on home o2,no hypoxia,no wheezing.   Aortic aneurysm: CTA noted ascending thoracic aortic aneurysm measuring 4.4 cm at the level of the main pulmonary artery, which was felt to be unchanged since a CT Dec 2018- will need follow-up annualimaging by CTA or MRA as outpt  H/o polysubstance abuse (alcohol and cocaine): He is interested in inpatient  detox facility, but was told he does not qualify due to on dialysis.  Smoker: smoking cessation education  Alcohol use: denies h/o alcohol withdrawal  Homeless Social worker consulted for resources-patient refused further help. Patient reports he has a primary care  doctor Dr. Joelene Millin and he has prescription written that he was supposed to go and pick it up.  He plans to see her family in a week. He says he is scheduled for dialysis tomorrow.  Reports he found place today where he is going to go for shelter/resources including detox.  He does not want to miss this opportunity and he likes to get discharged this morning. I offered him prescription medications and other resources but he declined. I had discussed the GI and reluctant to initiate anticoagulation at this time.  Patient however was adamant on leaving this morning and left AGAINST MEDICAL ADVICE before being seen by the consultant.  Patient at this time expresses desire to leave the Hospital immidiately, patient has been warned that this is not Medically advisable at this time, and can result in Medical complications like Death and Disability, patient understands and accepts the risks involved and assumes full responsibilty of this decision. I made my best effort to convince him to stay.  I offered him prescription however he declined it and reports he has a PCP will take care of his medication.   Discharge Instructions    No Known Allergies  Consultations:  Nephrology gastroenterology   Procedures/Studies: Dg Chest 2 View  Result Date: 08/18/2018 CLINICAL DATA:  Pt reports he last went to dialysis was 08-11-2018. Pt reports he does not go because he is homeless and he is not going through all that. Pt has bilateral lower extremity swelling. EXAM: CHEST - 2 VIEW COMPARISON:  08/15/2018 FINDINGS: The heart is enlarged. Lungs are free of focal consolidations and pleural effusions. No pulmonary edema. IMPRESSION: Cardiomegaly. Electronically Signed   By: Nolon Nations M.D.   On: 08/18/2018 15:45   Dg Chest 2 View  Result Date: 08/15/2018 CLINICAL DATA:  Chest pain and shortness of breath EXAM: CHEST - 2 VIEW COMPARISON:  08/09/2018 FINDINGS: Chronic cardiomegaly. Stable mediastinal contours.  Bilateral nipple shadows. There is no edema, consolidation, effusion, or pneumothorax. IMPRESSION: 1. No evidence of acute disease. 2. Cardiomegaly. Electronically Signed   By: Monte Fantasia M.D.   On: 08/15/2018 05:04   Dg Chest 2 View  Result Date: 08/09/2018 CLINICAL DATA:  Weakness and cough.  Fever. EXAM: CHEST - 2 VIEW COMPARISON:  08/01/2018, 07/12/2018, 04/15/2018 FINDINGS: The lungs are well inflated. There is mild cardiomegaly. There is no focal airspace consolidation or pulmonary edema. There is no pleural effusion or pneumothorax. IMPRESSION: Mild cardiomegaly without acute airspace disease. Electronically Signed   By: Ulyses Jarred M.D.   On: 08/09/2018 22:42   Ct Angio Chest Pe W And/or Wo Contrast  Result Date: 08/10/2018 CLINICAL DATA:  Chest pain with weakness and dyspnea EXAM: CT ANGIOGRAPHY CHEST WITH CONTRAST TECHNIQUE: Multidetector CT imaging of the chest was performed using the standard protocol during bolus administration of intravenous contrast. Multiplanar CT image reconstructions and MIPs were obtained to evaluate the vascular anatomy. CONTRAST:  80 cc ISOVUE-370 IOPAMIDOL (ISOVUE-370) INJECTION 76% COMPARISON:  06/12/2017 CT FINDINGS: Cardiovascular: Left main, LAD and RCA coronary arteriosclerosis. Cardiomegaly without pericardial effusion or thickening is noted. Aneurysmal dilatation of the ascending thoracic aorta is noted to 4.4 cm at the level of the main pulmonary artery. This is without significant change. Dilatation of  the main pulmonary arteries identified 4 cm compatible with chronic pulmonary hypertension. Satisfactory opacification to the segmental arterial level without acute pulmonary embolus is noted. Preferential opacification of the pulmonary arteries. No definite aortic dissection. Atherosclerosis at the origins of the great vessels with conventional branch pattern. Mediastinum/Nodes: No enlarged mediastinal, hilar, or axillary lymph nodes. Thyroid gland,  trachea, and esophagus demonstrate no significant findings. Lungs/Pleura: Atelectasis at the lung bases. No pneumothorax. No acute pulmonary consolidation, edema or dominant mass. Upper Abdomen: Atrophic right kidney with probable nonobstructing or renovascular calcifications. Small cortical and parapelvic cysts are noted bilaterally. The largest cyst is seen in the interpolar left kidney measuring up to 2.7 cm. The liver, included pancreas, spleen and adrenal glands are unremarkable. The gallbladder is free of stones. No bowel obstruction or inflammation. Musculoskeletal: Lower thoracic spondylosis with multilevel degenerative disc disease. No acute nor suspicious osseous lesions. Review of the MIP images confirms the above findings. IMPRESSION: 1. Ascending thoracic aortic aneurysm measuring 4.4 cm at the level of the main pulmonary artery. Recommend annual imaging followup by CTA or MRA. This recommendation follows 2010 ACCF/AHA/AATS/ACR/ASA/SCA/SCAI/SIR/STS/SVM Guidelines for the Diagnosis and Management of Patients with Thoracic Aortic Disease. 2010; 121: [id number]. 2. No acute pulmonary embolus. 3. Dilated main pulmonary artery to 4 cm compatible with chronic pulmonary hypertension. 4. Bilateral renal cysts with atrophy of the right kidney. 5. Lower thoracic spondylosis. Aortic Atherosclerosis (ICD10-I70.0). Aortic aneurysm NOS (ICD10-I71.9). Electronically Signed   By: Ashley Royalty M.D.   On: 08/10/2018 00:48   Dg Chest Port 1 View  Result Date: 08/01/2018 CLINICAL DATA:  Shortness of breath EXAM: PORTABLE CHEST 1 VIEW COMPARISON:  07/12/2018, 04/15/2018 FINDINGS: Cardiomegaly with aortic atherosclerosis. No consolidation or effusion. No pneumothorax. IMPRESSION: No active disease.  Cardiomegaly Electronically Signed   By: Donavan Foil M.D.   On: 08/01/2018 22:09    (Echo, Carotid, EGD, Colonoscopy, ERCP)    Subjective: Patient was resting well this morning in the room.  Discharge Exam: Vitals:    08/21/18 2113 08/22/18 0531  BP: (!) 156/88 (!) 175/106  Pulse: 73 82  Resp: 19 18  Temp: 98.9 F (37.2 C) 98.3 F (36.8 C)  SpO2: 95% 98%   Vitals:   08/21/18 1631 08/21/18 1731 08/21/18 2113 08/22/18 0531  BP: (!) 160/91 (!) 153/97 (!) 156/88 (!) 175/106  Pulse: 73 78 73 82  Resp: 18 18 19 18   Temp: 98.3 F (36.8 C) 98.1 F (36.7 C) 98.9 F (37.2 C) 98.3 F (36.8 C)  TempSrc: Other (Comment) Oral Oral Oral  SpO2: 98% 98% 95% 98%  Weight:   78.5 kg   Height:        General: Pt is alert, awake, not in acute distress Cardiovascular: RRR, S1/S2 +, no rubs, no gallops Respiratory: CTA bilaterally, no wheezing, no rhonchi Abdominal: Soft, NT, ND, bowel sounds + Extremities: no edema, no cyanosis   The results of significant diagnostics from this hospitalization (including imaging, microbiology, ancillary and laboratory) are listed below for reference.     Microbiology: No results found for this or any previous visit (from the past 240 hour(s)).   Labs: BNP (last 3 results) No results for input(s): BNP in the last 8760 hours. Basic Metabolic Panel: Recent Labs  Lab 08/18/18 1501 08/19/18 0528 08/20/18 0800 08/21/18 1307  NA 142 138 142 138  K 6.2* 4.4 4.6 5.6*  CL 101 98 104 102  CO2 20* 25 23 20*  GLUCOSE 84 80 84 74  BUN 120* 47*  62* 71*  CREATININE 22.27* 11.44* 14.14* 15.91*  CALCIUM 8.0* 7.7* 7.7* 8.2*  PHOS  --   --   --  5.4*   Liver Function Tests: Recent Labs  Lab 08/21/18 1307  ALBUMIN 2.8*   No results for input(s): LIPASE, AMYLASE in the last 168 hours. No results for input(s): AMMONIA in the last 168 hours. CBC: Recent Labs  Lab 08/18/18 1501 08/19/18 0528 08/20/18 0800 08/21/18 1307 08/22/18 0609  WBC 3.7* 3.9*  --  4.1 3.7*  NEUTROABS  --  2.3  --   --   --   HGB 6.9* 7.6* 7.3* 7.6* 8.6*  HCT 22.4* 23.9* 24.1* 25.0* 27.5*  MCV 101.4* 94.1  --  96.2 96.5  PLT 135* 133*  --  156 163   Cardiac Enzymes: No results for  input(s): CKTOTAL, CKMB, CKMBINDEX, TROPONINI in the last 168 hours. BNP: Invalid input(s): POCBNP CBG: No results for input(s): GLUCAP in the last 168 hours. D-Dimer No results for input(s): DDIMER in the last 72 hours. Hgb A1c No results for input(s): HGBA1C in the last 72 hours. Lipid Profile No results for input(s): CHOL, HDL, LDLCALC, TRIG, CHOLHDL, LDLDIRECT in the last 72 hours. Thyroid function studies No results for input(s): TSH, T4TOTAL, T3FREE, THYROIDAB in the last 72 hours.  Invalid input(s): FREET3 Anemia work up No results for input(s): VITAMINB12, FOLATE, FERRITIN, TIBC, IRON, RETICCTPCT in the last 72 hours. Urinalysis    Component Value Date/Time   COLORURINE YELLOW 05/08/2010 1335   APPEARANCEUR CLEAR 05/08/2010 1335   LABSPEC 1.009 05/08/2010 1335   PHURINE 6.0 05/08/2010 1335   GLUCOSEU NEGATIVE 05/08/2010 1335   HGBUR NEGATIVE 05/08/2010 1335   BILIRUBINUR NEGATIVE 05/08/2010 1335   KETONESUR NEGATIVE 05/08/2010 1335   PROTEINUR 100 (A) 05/08/2010 1335   UROBILINOGEN 0.2 05/08/2010 1335   NITRITE NEGATIVE 05/08/2010 1335   LEUKOCYTESUR SMALL (A) 05/08/2010 1335   Sepsis Labs Invalid input(s): PROCALCITONIN,  WBC,  LACTICIDVEN Microbiology No results found for this or any previous visit (from the past 240 hour(s)).   Time coordinating discharge: 0 minutes  SIGNED:   Antonieta Pert, MD  [facility] 08/22/2018, 10:32 AM  If 7PM-7AM, please contact night-coverage [email or web address]

## 2018-08-22 NOTE — Progress Notes (Addendum)
Subjective:  Seen in room , no cos, just hung up phone "from Man helping with housing and think I have situation solved now,  going to sign papers  After dc home today for housing "  Objective Vital signs in last 24 hours: Vitals:   08/21/18 1631 08/21/18 1731 08/21/18 2113 08/22/18 0531  BP: (!) 160/91 (!) 153/97 (!) 156/88 (!) 175/106  Pulse: 73 78 73 82  Resp: _0 Temp: 98.3 F (36.8 C) 98.1 F (36.7 C) 98.9 F (37.2 C) 98.3 F (36.8 C)  TempSrc: Other (Comment) Oral Oral Oral  SpO2: 98% 98% 95% 98%  Weight:   78.5 kg   Height:       Weight change: 1.751 kg  Physical Exam: General:Alert Chronically ill appearing AAM NAD  Heart:RRR, No M,R, G Lungs:CTA bilat. Abdomen:BS pos , soft , NT, ND Extremities:trace left  bipedal edema Dialysis Access:LUA AVF pos bruit    CXR x 4 - no edema  CT chest - minimal GG changes c/w mild edema perhaps   [facility] TTS 4hrs 450/800 74kg 2K/2.5Ca LU AVF Hep 2000 units Parsabiv 71m qHD mircera 1066m q2wks, last 22546mmircera given on 06/05/18 Calcitriol 1.5mg35mD   Problem/Plan: 1. ESRD -on HD TTS.NO HEPARIN With GI BLD/HDon admitafter missing for 1 week. Has been non compliant lately due to lack of housing. Encouraged compliance and discussed consequences of missing HD. Hyperkalemia-  Resolved  w hd  2. 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 Treated with argon plasma coagulation. Coloscopy= Polyps / int hemorrhoids/ Diverticulosis ,no active bleed  3.Volume /HTN-BP 156/88 / Cxrs all ne for  edema elevated. Continue home meds=.On Met 50mg61may    3. Anemia CKD+ ABLA-Hgb 8.6   02/10 . Tsat 23%start Iron load on hd( 5 days on hd then 50mg 45mkly ) Aranesp 60 incr to 100  4. Secondary hyperparathyroidism -with last alb 2.6 ( 02/03) CaCorec Ci 8.8 Phos high as OP. Continue binders, VDRA. Unable to give parsabiv b/cnot  available in hospital./ follow up renal labs pre hd   5. Nutrition -Renal diet w/fluid restrictions  6 Homelessness - Today pt states he has plan for housing with assistance program SW consulting. Patient declined additional assistance they offered. Will f/u with SW at OP unit.  7 . CAD  8. PVD    9 Hep C - s/p interferon therapy   Raymond [name] [phone],3:01 PM  LOS: 2 days   Pt seen, examined and agree w A/P as above.  [facility] 08/22/2018, 4:23 PM    Labs: Basic Metabolic Panel: Recent Labs  Lab 08/19/18 0528 08/20/18 0800 08/21/18 1307  NA 138 142 138  K 4.4 4.6 5.6*  CL 98 104 102  CO2 25 23 20*  GLUCOSE 80 84 74  BUN 47* 62* 71*  CREATININE 11.44* 14.14* 15.91*  CALCIUM 7.7* 7.7* 8.2*  PHOS  --   --  5.4*   Liver Function Tests: Recent Labs  Lab 08/21/18 1307  ALBUMIN 2.8*   No results for input(s): LIPASE, AMYLASE in the last 168 hours. No results for input(s): AMMONIA in the last 168 hours. CBC: Recent Labs  Lab 08/18/18 1501 08/19/18 0528 08/20/18 0800 08/21/18 1307 08/22/18 0609  WBC 3.7* 3.9*  --  4.1 3.7*  NEUTROABS  --  2.3  --   --   --   HGB  6.9* 7.6* 7.3* 7.6* 8.6*  HCT 22.4* 23.9* 24.1* 25.0* 27.5*  MCV 101.4* 94.1  --  96.2 96.5  PLT 135* 133*  --  156 163   Cardiac Enzymes: No results for input(s): CKTOTAL, CKMB, CKMBINDEX, TROPONINI in the last 168 hours. CBG: No results for input(s): GLUCAP in the last 168 hours.  Studies/Results: No results found. Medications:     see

## 2018-08-22 NOTE — Progress Notes (Signed)
    Progress Note   Subjective  Chief Complaint: Melena  Patient with no further melena overnight, no new complaints.  He is dressed in his bags are packed.  He is ready to go home.   Objective   Vital signs in last 24 hours: Temp:  [98.1 F (36.7 C)-98.9 F (37.2 C)] 98.3 F (36.8 C) (02/12 0531) Pulse Rate:  [72-82] 82 (02/12 0531) Resp:  [18-19] 18 (02/12 0531) BP: (144-198)/(87-117) 175/106 (02/12 0531) SpO2:  [95 %-100 %] 98 % (02/12 0531) Weight:  [78.5 kg] 78.5 kg (02/11 2113) Last BM Date: 08/22/18 General:    AA male in NAD Heart:  Regular rate and rhythm; no murmurs Lungs: Respirations even and unlabored, lungs CTA bilaterally Abdomen:  Soft, nontender and nondistended. Normal bowel sounds. Extremities:  Without edema. Neurologic:  Alert and oriented,  grossly normal neurologically. Psych:  Cooperative. Normal mood and affect.  Intake/Output from previous day: 02/11 0701 - 02/12 0700 In: 280 [P.O.:120; I.V.:50; IV Piggyback:110] Out: 2500   Lab Results: Recent Labs    08/20/18 0800 08/21/18 1307 08/22/18 0609  WBC  --  4.1 3.7*  HGB 7.3* 7.6* 8.6*  HCT 24.1* 25.0* 27.5*  PLT  --  156 163   BMET Recent Labs    08/20/18 0800 08/21/18 1307  NA 142 138  K 4.6 5.6*  CL 104 102  CO2 23 20*  GLUCOSE 84 74  BUN 62* 71*  CREATININE 14.14* 15.91*  CALCIUM 7.7* 8.2*   LFT Recent Labs    08/21/18 1307  ALBUMIN 2.8*     Assessment / Plan:   Assessment: 1.  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  Discussed that there is no cause of her anemia found except for small bowel AVMs noted on EGD.  It was recommended he monitor recurrence of symptoms post ablation of AVMs, if it recurs then would recommend capsule endoscopy, it was explained that patient is unreliable as he is homeless  and unable to stay on his anticoagulation, this is likely not a good idea in this patient 2.  Anemia  Plan: 1.  Again anticoagulation likely not a good idea in this patient at discharge 2.  Continue to monitor for further signs of any bleeding, if he does have rebleed would recommend a small bowel capsule endoscopy 3.  Patient is stable this morning, okay with discharge home.  Thank you for your kind consultation.  We will sign off.    LOS: 2 days   Levin Erp  08/22/2018, 11:27 AM

## 2018-08-22 NOTE — Progress Notes (Signed)
Date: 08/22/2018 Patient: Raymond [name] Admitted: 08/18/2018  2:27 PM Attending Provider: No att. providers found  [name] has made the decision to leave the unit against the advice.oHe has been informed and understands the inherent risks, including death.  He has decided to accept the responsibility for this decision.   [name] had current vital signs as follows:  Blood pressure (!) 175/106, pulse 82, temperature 98.3 F (36.8 C), temperature source Oral, resp. rate 18, height 5\' 8"  (1.727 m), weight 78.5 kg, SpO2 98 %.   [name]  has signed the Leaving Against Medical Advice form prior to leaving the department.  Marlowe Kays [name] 08/22/2018

## 2018-08-22 NOTE — Addendum Note (Signed)
Addendum  created 08/22/18 1219 by Lillia Abed, MD   Attestation recorded in [place], [place] filed

## 2018-08-22 NOTE — Progress Notes (Signed)
Had a large soft clay bowel movement this am. No bleeding noted.

## 2018-08-27 ENCOUNTER — Emergency Department (HOSPITAL_COMMUNITY)
Admission: EM | Admit: 2018-08-27 | Discharge: 2018-08-27 | Disposition: A | Discharge status: Home / Self Care | Payer: Medicare Other | Attending: Emergency Medicine | Admitting: Emergency Medicine

## 2018-08-27 ENCOUNTER — Emergency Department (HOSPITAL_COMMUNITY): Payer: Medicare Other

## 2018-08-27 ENCOUNTER — Encounter (HOSPITAL_COMMUNITY): Payer: Self-pay | Admitting: Emergency Medicine

## 2018-08-27 ENCOUNTER — Other Ambulatory Visit: Payer: Self-pay

## 2018-08-27 DIAGNOSIS — F17210 Nicotine dependence, cigarettes, uncomplicated: Secondary | ICD-10-CM | POA: Insufficient documentation

## 2018-08-27 DIAGNOSIS — R0602 Shortness of breath: Secondary | ICD-10-CM | POA: Diagnosis not present

## 2018-08-27 DIAGNOSIS — J189 Pneumonia, unspecified organism: Secondary | ICD-10-CM | POA: Insufficient documentation

## 2018-08-27 DIAGNOSIS — J449 Chronic obstructive pulmonary disease, unspecified: Secondary | ICD-10-CM | POA: Insufficient documentation

## 2018-08-27 DIAGNOSIS — I2510 Atherosclerotic heart disease of native coronary artery without angina pectoris: Secondary | ICD-10-CM | POA: Diagnosis not present

## 2018-08-27 DIAGNOSIS — N186 End stage renal disease: Secondary | ICD-10-CM | POA: Insufficient documentation

## 2018-08-27 DIAGNOSIS — I120 Hypertensive chronic kidney disease with stage 5 chronic kidney disease or end stage renal disease: Secondary | ICD-10-CM | POA: Insufficient documentation

## 2018-08-27 DIAGNOSIS — Z79899 Other long term (current) drug therapy: Secondary | ICD-10-CM | POA: Diagnosis not present

## 2018-08-27 DIAGNOSIS — M7989 Other specified soft tissue disorders: Secondary | ICD-10-CM | POA: Diagnosis not present

## 2018-08-27 DIAGNOSIS — R05 Cough: Secondary | ICD-10-CM | POA: Diagnosis not present

## 2018-08-27 LAB — BASIC METABOLIC PANEL
Anion gap: 15 (ref 5–15)
BUN: 92 mg/dL — ABNORMAL HIGH (ref 8–23)
CO2: 22 mmol/L (ref 22–32)
CREATININE: 18.81 mg/dL — AB (ref 0.61–1.24)
Calcium: 8.5 mg/dL — ABNORMAL LOW (ref 8.9–10.3)
Chloride: 104 mmol/L (ref 98–111)
GFR calc non Af Amer: 2 mL/min — ABNORMAL LOW (ref >60)
GFR, EST AFRICAN AMERICAN: 3 mL/min — AB (ref >60)
Glucose, Bld: 89 mg/dL (ref 70–99)
Potassium: 5.1 mmol/L (ref 3.5–5.1)
Sodium: 141 mmol/L (ref 135–145)

## 2018-08-27 LAB — CBC WITH DIFFERENTIAL/PLATELET
Abs Immature Granulocytes: 0.02 10*3/uL (ref 0.00–0.07)
Basophils Absolute: 0 10*3/uL (ref 0.0–0.1)
Basophils Relative: 0 %
Eosinophils Absolute: 0.1 10*3/uL (ref 0.0–0.5)
Eosinophils Relative: 2 %
HCT: 25.7 % — ABNORMAL LOW (ref 39.0–52.0)
Hemoglobin: 7.6 g/dL — ABNORMAL LOW (ref 13.0–17.0)
Immature Granulocytes: 1 %
LYMPHS ABS: 0.9 10*3/uL (ref 0.7–4.0)
Lymphocytes Relative: 21 %
MCH: 29.2 pg (ref 26.0–34.0)
MCHC: 29.6 g/dL — AB (ref 30.0–36.0)
MCV: 98.8 fL (ref 80.0–100.0)
Monocytes Absolute: 0.6 10*3/uL (ref 0.1–1.0)
Monocytes Relative: 16 %
Neutro Abs: 2.4 10*3/uL (ref 1.7–7.7)
Neutrophils Relative %: 60 %
Platelets: 157 10*3/uL (ref 150–400)
RBC: 2.6 MIL/uL — ABNORMAL LOW (ref 4.22–5.81)
RDW: 18.7 % — ABNORMAL HIGH (ref 11.5–15.5)
WBC: 4 10*3/uL (ref 4.0–10.5)
nRBC: 0 % (ref 0.0–0.2)

## 2018-08-27 LAB — I-STAT TROPONIN, ED: Troponin i, poc: 0.03 ng/mL (ref 0.00–0.08)

## 2018-08-27 IMAGING — DX DG CHEST 2V
3 series · 3 of 3 positions shown · non-contrast
Comparison: [DATE]

CLINICAL DATA: Missed dialysis treatment 2 days ago with chest
pain, shortness of breath and dry cough. Smoker.

EXAM:
CHEST - 2 VIEW

[chest pa]
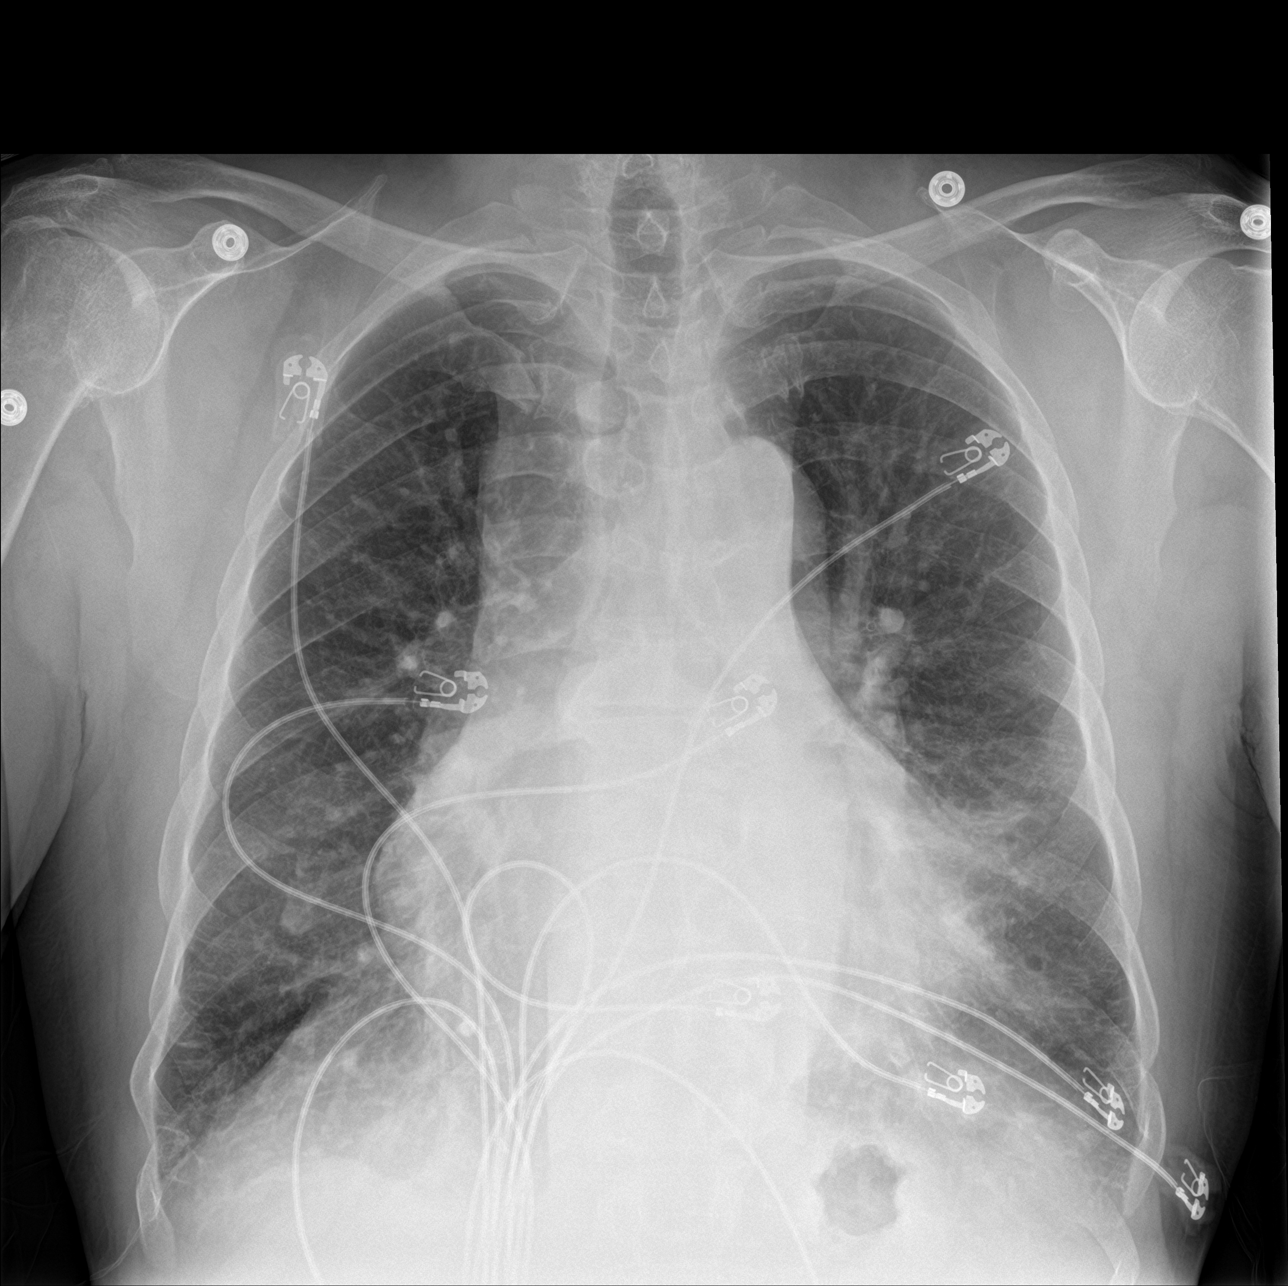

[chest lat]
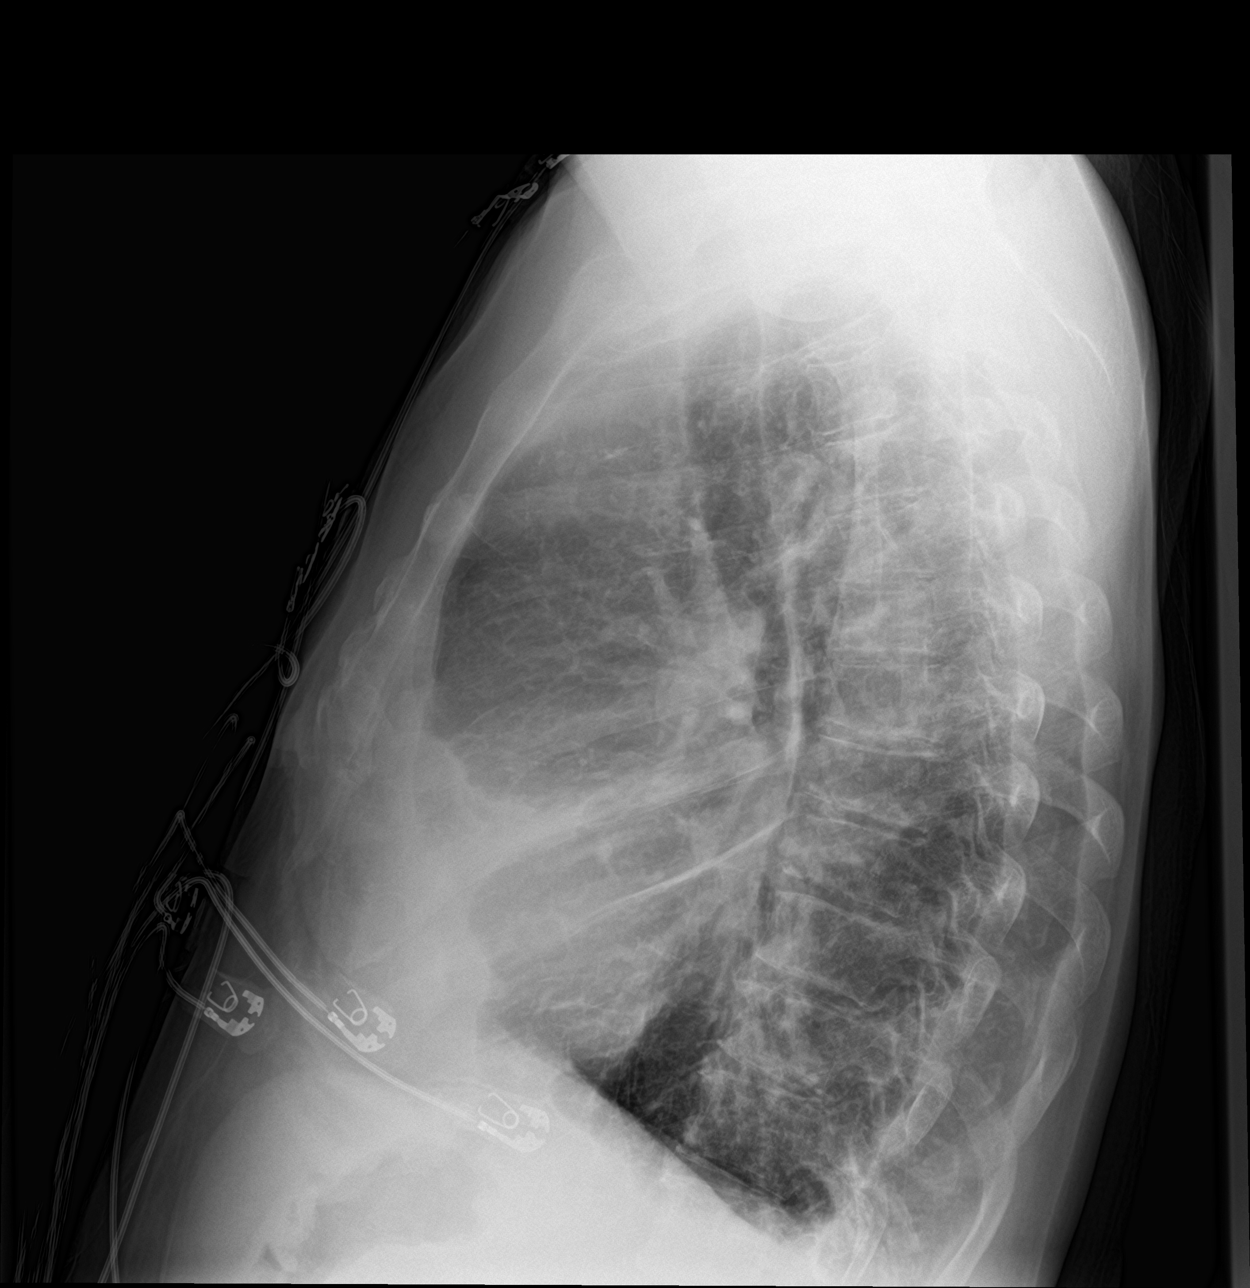

[chest ap]
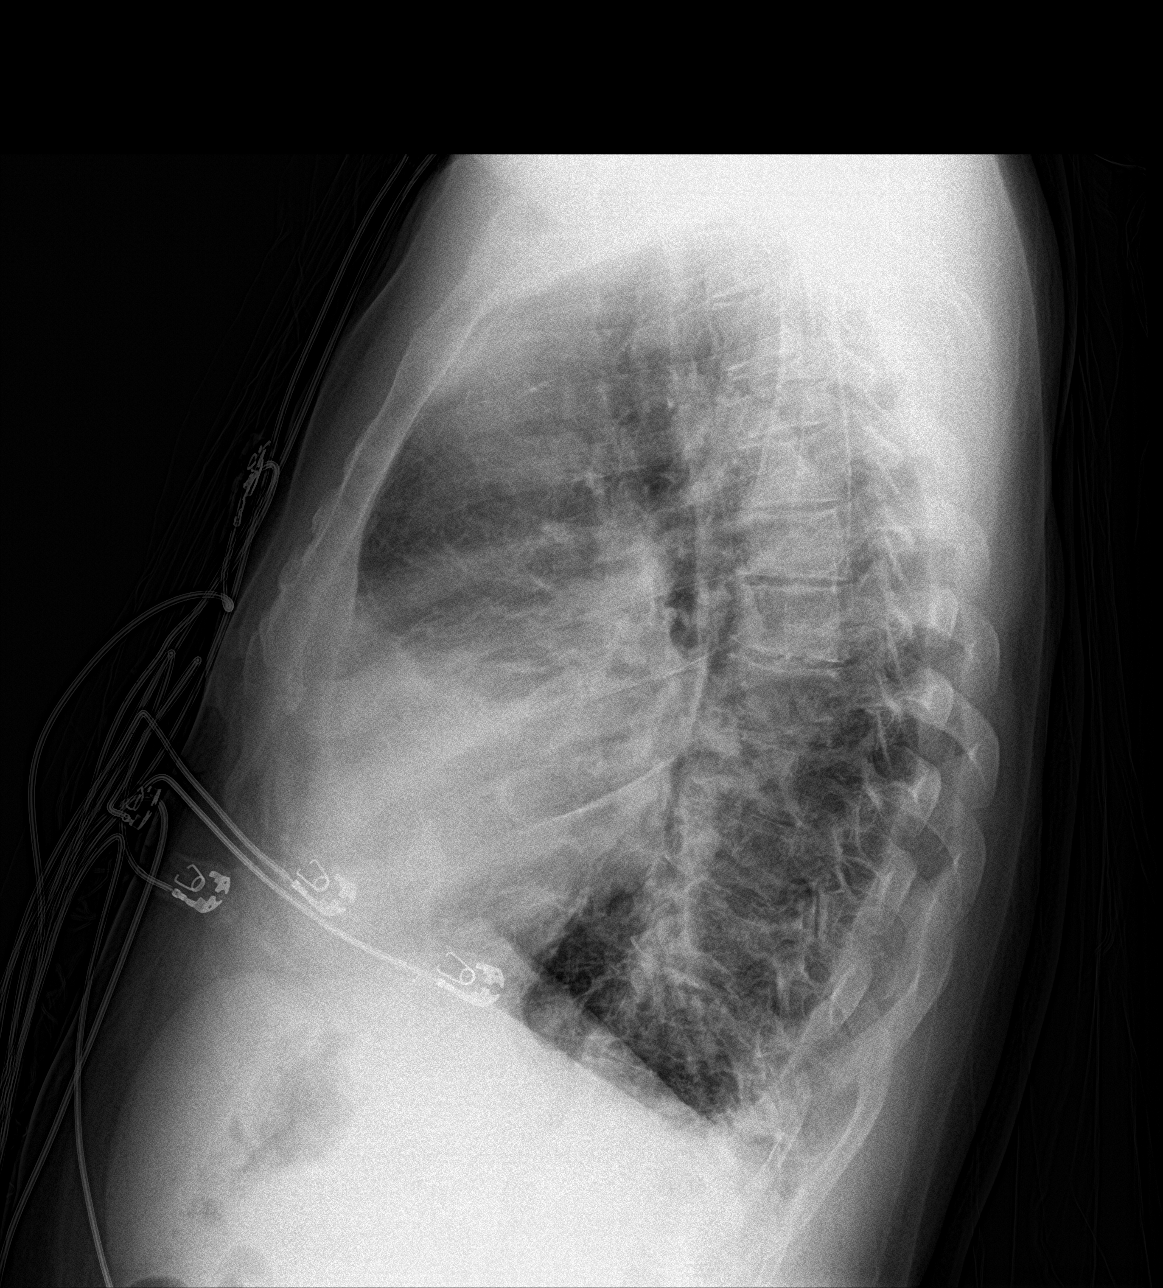

[3 of 3 positions shown; findings below may reference images not displayed]

FINDINGS: Lungs are adequately inflated demonstrate airspace opacification
over the lingula which may be due to atelectasis or infection. No
effusion. Mild stable cardiomegaly. Remainder of the exam is
unchanged.
IMPRESSION: Opacification over the lingula which may be due to atelectasis or
infection.

Stable cardiomegaly.

## 2018-08-27 MED ORDER — LEVOFLOXACIN 750 MG PO TABS: 750.0000 mg | ORAL_TABLET | Freq: Once | ORAL | Status: DC

## 2018-08-27 MED ORDER — AMOXICILLIN-POT CLAVULANATE 500-125 MG PO TABS
1.0000 | ORAL_TABLET | Freq: Once | ORAL | Status: AC
  Administered 2018-08-27: 500 mg via ORAL
  Filled 2018-08-27: qty 1

## 2018-08-27 MED ORDER — AMOXICILLIN-POT CLAVULANATE 500-125 MG PO TABS: 1.0000 | ORAL_TABLET | Freq: Every day | ORAL | 0 refills | Status: AC

## 2018-08-27 NOTE — ED Triage Notes (Signed)
Pt in from home with sob, worsened today. [facility] dialysis pt, missed Saturday. Reporting some cp and dry cough, bilateral leg swelling

## 2018-08-27 NOTE — Discharge Instructions (Addendum)
It is very important that you go to dialysis.  Your next dialysis session is tomorrow, 2/18.  You have been given Augmentin for pneumonia.  You need to take this once per day, in the evening.  If you develop trouble breathing, worsening cough or coughing up blood, fever, vomiting, or any other new/concerning symptoms then return to the ER for evaluation.

## 2018-08-27 NOTE — ED Provider Notes (Signed)
[facility]ERG[facility]Y DEPARTMENT Provider Note   CSN: [id number] Arrival date & time: 08/27/18  3267     History   Chief Complaint Chief Complaint  Patient presents with  . Shortness of Breath  . missed dialysis    HPI Raymond [name] is a 63 y.o. male.  HPI  63 year old male presents with shortness of breath.  He states that he missed dialysis on Saturday, today is Monday.  He states he had no transportation.  He has been having shortness of breath since that day in addition to tightness in his chest/chest pain and throat pain.  He is noticed some leg swelling.  Feels similar to missing dialysis except usually he does not feel this bad until he is missed a couple dialysis sessions.  Some cough.  No fevers.  Past Medical History:  Diagnosis Date  . Anemia   . CAD (coronary artery disease)    a. 03/2018: cath showing 20 to 30% stenosis along the LAD, luminal irregularities along the RCA, and angiographically normal LCx  . COPD (chronic obstructive pulmonary disease) ([place])   . Dyspnea    "with too much fluid"  . ESRD (end stage renal disease) on dialysis [facility])    "MWF; Jeneen Rinks" (07/22/17)  . Hemodialysis patient ([place])   . Hepatitis C    Still positive s/p liver biopsy at [facility]  and interferon therapy for 6 months. Most recent lab work was on 10/24/12  . Hepatitis C    "took the tx; gone now" (12/05/2016)  Was treated  . History of blood transfusion ~ 2012/2013   "related to my kidneys; blood was low"  . Hypertension   . Substance abuse ([place])   . Thyroid disease     Patient Active Problem List   Diagnosis Date Noted  . Benign neoplasm of colon   . Melena   . Anemia due to chronic kidney disease, on chronic dialysis ([place])   . Acute on chronic anemia   . H/O medication noncompliance   . Polysubstance (excluding opioids) dependence ([place])   . Atrial flutter with rapid ventricular response ([place]) 08/10/2018  . PAF (paroxysmal atrial fibrillation) ([place])  08/01/2018  . Acute respiratory failure with hypoxia ([place]) 07/13/2018  . Non-compliance with renal dialysis ([place]) 07/13/2018  . PAD (peripheral artery disease) ([place]) 04/11/2018  . Sepsis ([place]) 03/31/2018  . Preop cardiovascular exam 03/29/2018  . Volume overload 08/07/2017  . Acute pulmonary edema ([facility])   . Dyspnea 06/20/2017  . Acute bronchitis   . COPD (chronic obstructive pulmonary disease) ([place])   . End-stage renal disease on hemodialysis ([place])   . Hypoxia 12/05/2016  . Hyperkalemia 12/19/2015  . Hypoglycemia 12/19/2015  . Polysubstance abuse ([place]) 12/19/2015  . Homeless 12/19/2015  . Anemia associated with stage 5 chronic renal failure ([place]) 12/19/2015  . Atypical chest pain 12/19/2013  . Atherosclerosis of native arteries of extremity with intermittent claudication ([place]) 12/04/2012  . ESRD (end stage renal disease) on dialysis ([place]) 12/04/2012  . HEPATITIS C 09/07/2006  . HYPERCHOLESTEROLEMIA 09/07/2006  . HYPERTENSION, BENIGN SYSTEMIC 09/07/2006    Past Surgical History:  Procedure Laterality Date  . ABDOMINAL AORTOGRAM W/LOWER EXTREMITY N/A 02/08/2018   Procedure: ABDOMINAL AORTOGRAM W/LOWER EXTREMITY;  Surgeon: Conrad [place], MD;  Location: [facility] LAB;  Service: Cardiovascular;  Laterality: N/A;  . AV FISTULA PLACEMENT Left Aug. 2013 ?  . COLONOSCOPY WITH PROPOFOL N/A 08/21/2018   Procedure: COLONOSCOPY WITH PROPOFOL;  Surgeon: Yetta Flock, MD;  Location: [facility] ENDOSCOPY;  Service: Gastroenterology;  Laterality: N/A;  . ESOPHAGOGASTRODUODENOSCOPY (EGD) WITH PROPOFOL N/A 08/20/2018   Procedure: ESOPHAGOGASTRODUODENOSCOPY (EGD) WITH PROPOFOL;  Surgeon: Gatha Mayer, MD;  Location: [facility];  Service: Endoscopy;  Laterality: N/A;  . FEMORAL-POPLITEAL BYPASS GRAFT Left 04/11/2018   Procedure: BYPASS GRAFT FEMORAL-POPLITEAL ARTERY LEFT LEG;  Surgeon: Rosetta Posner, MD;  Location: [facility];  Service: Vascular;  Laterality: Left;  . HOT HEMOSTASIS N/A 08/20/2018    Procedure: HOT HEMOSTASIS (ARGON PLASMA COAGULATION/BICAP);  Surgeon: Gatha Mayer, MD;  Location: [facility] ENDOSCOPY;  Service: Endoscopy;  Laterality: N/A;  . LEFT HEART CATH AND CORONARY ANGIOGRAPHY N/A 03/29/2018   Procedure: LEFT HEART CATH AND CORONARY ANGIOGRAPHY;  Surgeon: Nelva Bush, MD;  Location: [facility] CV LAB;  Service: Cardiovascular;  Laterality: N/A;  . LIVER BIOPSY    . ORIF ULNAR FRACTURE Left 05/18/2017   Procedure: OPEN REDUCTION INTERNAL FIXATION (ORIF) ULNAR FRACTURE;  Surgeon: Iran Planas, MD;  Location: [facility];  Service: Orthopedics;  Laterality: Left;  . POLYPECTOMY  08/21/2018   Procedure: POLYPECTOMY;  Surgeon: Yetta Flock, MD;  Location: [facility] ENDOSCOPY;  Service: Gastroenterology;;  . SHUNTOGRAM N/A 12/11/2012   Procedure: Earney Mallet;  Surgeon: Serafina Mitchell, MD;  Location: [facility] CATH LAB;  Service: Cardiovascular;  Laterality: N/A;        Home Medications    Prior to Admission medications   Medication Sig Start Date End Date Taking? Authorizing Provider  acetaminophen (TYLENOL) 325 MG tablet Take 650 mg by mouth every 6 (six) hours as needed for mild pain.   Yes [provider]  amiodarone (PACERONE) 200 MG tablet Take 1 tablet (200 mg total) by mouth daily. 08/13/18  Yes Cherene Altes, MD  amLODipine (NORVASC) 2.5 MG tablet Take 2.5 mg by mouth daily.   Yes [provider]  metoprolol succinate (TOPROL-XL) 50 MG 24 hr tablet Take 50 mg by mouth daily. Take with or immediately following a meal.   Yes [provider]  amoxicillin-clavulanate (AUGMENTIN) 500-125 MG tablet Take 1 tablet (500 mg total) by mouth at bedtime for 7 days. 08/27/18 09/03/18  Sherwood Gambler, MD  ondansetron (ZOFRAN) 4 MG tablet Take 1 tablet (4 mg total) by mouth every 8 (eight) hours as needed for nausea or vomiting. Patient not taking: Reported on 08/18/2018 04/03/18   Thurnell Lose, MD  warfarin (COUMADIN) 5 MG tablet Take 1 tablet (5 mg total) by  mouth daily at 6 PM. Patient not taking: Reported on 08/27/2018 08/13/18   Cherene Altes, MD    Family History Family History  Problem Relation Age of Onset  . Diabetes Father   . Hypertension Father   . Heart disease Father     Social History Social History   Tobacco Use  . Smoking status: Current Every Day Smoker    Packs/day: 1.00    Years: 25.00    Pack years: 25.00    Types: Cigarettes    Last attempt to quit: 08/01/2011    Years since quitting: 7.0  . Smokeless tobacco: Never Used  . Tobacco comment: he smokes 6 cigarettes a week  Substance Use Topics  . Alcohol use: Yes    Comment: occasionally/ one half pint  . Drug use: Yes    Types: Cocaine    Comment: he used yesterday     Allergies   Patient has no known allergies.   Review of Systems Review of Systems  Constitutional: Negative for fever.  HENT: Positive for sore throat.   Respiratory: Positive for cough and shortness of breath.   Cardiovascular: Positive for chest pain and leg swelling.  All other systems reviewed and are negative.    Physical Exam Updated Vital Signs BP (!) 168/100 (BP Location: Right Arm)   Pulse 90   Temp 97.6 F (36.4 C) (Oral)   Resp 20   Wt 78.5 kg   SpO2 95%   BMI 26.31 kg/m   Physical Exam Vitals signs and nursing note reviewed.  Constitutional:      General: He is not in acute distress.    Appearance: He is well-developed. He is not ill-appearing or diaphoretic.  HENT:     Head: Normocephalic and atraumatic.     Right Ear: External ear normal.     Left Ear: External ear normal.     Nose: Nose normal.     Mouth/Throat:     Pharynx: No pharyngeal swelling or oropharyngeal exudate.  Eyes:     General:        Right eye: No discharge.        Left eye: No discharge.  Neck:     Musculoskeletal: Neck supple.  Cardiovascular:     Rate and Rhythm: Normal rate and regular rhythm.     Heart sounds: Normal heart sounds.  Pulmonary:     Effort: Pulmonary  effort is normal. Tachypnea present. No accessory muscle usage or respiratory distress.     Breath sounds: Examination of the right-lower field reveals decreased breath sounds. Examination of the left-lower field reveals decreased breath sounds. Decreased breath sounds (mild, bases) present.  Chest:     Chest wall: No tenderness.  Abdominal:     Palpations: Abdomen is soft.     Tenderness: There is no abdominal tenderness.  Musculoskeletal:     Right lower leg: Edema present.     Left lower leg: Edema present.     Comments: Mild bilateral ankle swelling  Skin:    General: Skin is warm and dry.  Neurological:     Mental Status: He is alert.  Psychiatric:        Mood and Affect: Mood is not anxious.      ED Treatments / Results  Labs (all labs ordered are listed, but only abnormal results are displayed) Labs Reviewed  BASIC METABOLIC PANEL - Abnormal; Notable for the following components:      Result Value   BUN 92 (*)    Creatinine, Ser 18.81 (*)    Calcium 8.5 (*)    GFR calc non Af Amer 2 (*)    GFR calc Af Amer 3 (*)    All other components within normal limits  CBC WITH DIFFERENTIAL/PLATELET - Abnormal; Notable for the following components:   RBC 2.60 (*)    Hemoglobin 7.6 (*)    HCT 25.7 (*)    MCHC 29.6 (*)    RDW 18.7 (*)    All other components within normal limits  CULTURE, BLOOD (ROUTINE X 2)  CULTURE, BLOOD (ROUTINE X 2)  I-STAT TROPONIN, ED    EKG EKG Interpretation  Date/Time:  Monday August 27 2018 09:34:17 EST Ventricular Rate:  90 PR Interval:    QRS Duration: 93 QT Interval:  412 QTC Calculation: 505 R Axis:   40 Text Interpretation:  Sinus rhythm Prolonged QT interval no significant change since Aug 18 2018 Confirmed by Sherwood Gambler [phone]) on 08/27/2018 9:41:10 AM Also confirmed by Sherwood Gambler [phone]), editor Hattie Perch (  [phone])  on 08/27/2018 11:02:01 AM   Radiology Dg Chest 2 View  Result Date: 08/27/2018 CLINICAL DATA:   Missed dialysis treatment 2 days ago with chest pain, shortness of breath and dry cough. Smoker. EXAM: CHEST - 2 VIEW COMPARISON:  08/18/2018 FINDINGS: Lungs are adequately inflated demonstrate airspace opacification over the lingula which may be due to atelectasis or infection. No effusion. Mild stable cardiomegaly. Remainder of the exam is unchanged. IMPRESSION: Opacification over the lingula which may be due to atelectasis or infection. Stable cardiomegaly. Electronically Signed   By: Marin Olp M.D.   On: 08/27/2018 11:26    Procedures Procedures (including critical care time)  Medications Ordered in ED Medications  amoxicillin-clavulanate (AUGMENTIN) 500-125 MG per tablet 500 mg (500 mg Oral Given 08/27/18 1401)     Initial Impression / Assessment and Plan / ED Course  I have reviewed the triage vital signs and the nursing notes.  Pertinent labs & imaging results that were available during my care of the patient were reviewed by me and considered in my medical decision making (see chart for details).     I discussed with Dr. Jonnie Finner, no indication for emergent dialysis but he deftly needs to go to dialysis tomorrow.  His chest x-ray is concerning for mild pneumonia.  I had a discussion with pharmacy given he is supposed to be on warfarin though he is chronically subtherapeutic and has not been taking for the last week or so.  Given this, Levaquin or azithromycin would not be the best.  Pharmacy recommends Augmentin at 500/125 mg dose, once per day.  He will be given this now and prescription.  We discussed the importance with following up with dialysis as well as return precautions.  However he is not having significant increased work of breathing or any hypoxia at this time.  Final Clinical Impressions(s) / ED Diagnoses   Final diagnoses:  Lingular pneumonia    ED Discharge Orders         Ordered    amoxicillin-clavulanate (AUGMENTIN) 500-125 MG tablet  Daily at bedtime      08/27/18 1239           Sherwood Gambler, MD 08/27/18 1557

## 2018-08-27 NOTE — ED Notes (Signed)
Discharge reviewed with patient. Opportunities for further teaching was presented to patient.  Nu further questions/concerns at this time.

## 2018-08-28 DIAGNOSIS — N186 End stage renal disease: Secondary | ICD-10-CM | POA: Diagnosis not present

## 2018-08-28 DIAGNOSIS — N2581 Secondary hyperparathyroidism of renal origin: Secondary | ICD-10-CM | POA: Diagnosis not present

## 2018-08-28 DIAGNOSIS — D631 Anemia in chronic kidney disease: Secondary | ICD-10-CM | POA: Diagnosis not present

## 2018-08-28 DIAGNOSIS — J22 Unspecified acute lower respiratory infection: Secondary | ICD-10-CM | POA: Diagnosis not present

## 2018-08-28 DIAGNOSIS — D509 Iron deficiency anemia, unspecified: Secondary | ICD-10-CM | POA: Diagnosis not present

## 2018-08-30 DIAGNOSIS — N186 End stage renal disease: Secondary | ICD-10-CM | POA: Diagnosis not present

## 2018-08-30 DIAGNOSIS — D631 Anemia in chronic kidney disease: Secondary | ICD-10-CM | POA: Diagnosis not present

## 2018-08-30 DIAGNOSIS — N2581 Secondary hyperparathyroidism of renal origin: Secondary | ICD-10-CM | POA: Diagnosis not present

## 2018-08-30 DIAGNOSIS — D509 Iron deficiency anemia, unspecified: Secondary | ICD-10-CM | POA: Diagnosis not present

## 2018-09-01 DIAGNOSIS — N2581 Secondary hyperparathyroidism of renal origin: Secondary | ICD-10-CM | POA: Diagnosis not present

## 2018-09-01 DIAGNOSIS — D509 Iron deficiency anemia, unspecified: Secondary | ICD-10-CM | POA: Diagnosis not present

## 2018-09-01 DIAGNOSIS — N186 End stage renal disease: Secondary | ICD-10-CM | POA: Diagnosis not present

## 2018-09-01 DIAGNOSIS — D631 Anemia in chronic kidney disease: Secondary | ICD-10-CM | POA: Diagnosis not present

## 2018-09-01 LAB — CULTURE, BLOOD (ROUTINE X 2)
Culture: NO GROWTH
Culture: NO GROWTH
Special Requests: ADEQUATE
Special Requests: ADEQUATE

## 2018-09-05 ENCOUNTER — Ambulatory Visit (HOSPITAL_COMMUNITY)
Admission: RE | Admit: 2018-09-05 | Discharge: 2018-09-05 | Disposition: A | Discharge status: Home / Self Care | Payer: Medicare Other | Source: Ambulatory Visit | Attending: Vascular Surgery | Admitting: Vascular Surgery

## 2018-09-05 DIAGNOSIS — I70219 Atherosclerosis of native arteries of extremities with intermittent claudication, unspecified extremity: Secondary | ICD-10-CM | POA: Diagnosis not present

## 2018-09-05 DIAGNOSIS — F17200 Nicotine dependence, unspecified, uncomplicated: Secondary | ICD-10-CM | POA: Diagnosis not present

## 2018-09-05 DIAGNOSIS — I739 Peripheral vascular disease, unspecified: Secondary | ICD-10-CM

## 2018-09-06 DIAGNOSIS — D509 Iron deficiency anemia, unspecified: Secondary | ICD-10-CM | POA: Diagnosis not present

## 2018-09-06 DIAGNOSIS — D631 Anemia in chronic kidney disease: Secondary | ICD-10-CM | POA: Diagnosis not present

## 2018-09-06 DIAGNOSIS — N186 End stage renal disease: Secondary | ICD-10-CM | POA: Diagnosis not present

## 2018-09-06 DIAGNOSIS — N2581 Secondary hyperparathyroidism of renal origin: Secondary | ICD-10-CM | POA: Diagnosis not present

## 2018-09-07 DIAGNOSIS — Z8619 Personal history of other infectious and parasitic diseases: Secondary | ICD-10-CM | POA: Diagnosis not present

## 2018-09-07 DIAGNOSIS — I129 Hypertensive chronic kidney disease with stage 1 through stage 4 chronic kidney disease, or unspecified chronic kidney disease: Secondary | ICD-10-CM | POA: Diagnosis not present

## 2018-09-07 DIAGNOSIS — Z0189 Encounter for other specified special examinations: Secondary | ICD-10-CM | POA: Diagnosis not present

## 2018-09-09 DIAGNOSIS — N186 End stage renal disease: Secondary | ICD-10-CM | POA: Diagnosis not present

## 2018-09-09 DIAGNOSIS — Z992 Dependence on renal dialysis: Secondary | ICD-10-CM | POA: Diagnosis not present

## 2018-09-09 DIAGNOSIS — N2889 Other specified disorders of kidney and ureter: Secondary | ICD-10-CM | POA: Diagnosis not present

## 2018-09-11 ENCOUNTER — Encounter (HOSPITAL_COMMUNITY): Payer: Self-pay

## 2018-09-11 ENCOUNTER — Emergency Department (HOSPITAL_COMMUNITY): Payer: Medicare Other

## 2018-09-11 ENCOUNTER — Encounter: Payer: Self-pay | Admitting: Family

## 2018-09-11 ENCOUNTER — Other Ambulatory Visit: Payer: Self-pay

## 2018-09-11 ENCOUNTER — Ambulatory Visit: Payer: Self-pay | Admitting: Vascular Surgery

## 2018-09-11 ENCOUNTER — Inpatient Hospital Stay (HOSPITAL_COMMUNITY)
Admission: EM | Admit: 2018-09-11 | Discharge: 2018-09-14 | DRG: 640 | Disposition: A | Discharge status: Home / Self Care | Payer: Medicare Other | Attending: Internal Medicine | Admitting: Internal Medicine

## 2018-09-11 DIAGNOSIS — J9811 Atelectasis: Secondary | ICD-10-CM | POA: Diagnosis not present

## 2018-09-11 DIAGNOSIS — F17210 Nicotine dependence, cigarettes, uncomplicated: Secondary | ICD-10-CM | POA: Diagnosis present

## 2018-09-11 DIAGNOSIS — F1420 Cocaine dependence, uncomplicated: Secondary | ICD-10-CM | POA: Diagnosis present

## 2018-09-11 DIAGNOSIS — Z833 Family history of diabetes mellitus: Secondary | ICD-10-CM

## 2018-09-11 DIAGNOSIS — N185 Chronic kidney disease, stage 5: Secondary | ICD-10-CM

## 2018-09-11 DIAGNOSIS — E8889 Other specified metabolic disorders: Secondary | ICD-10-CM | POA: Diagnosis present

## 2018-09-11 DIAGNOSIS — F1920 Other psychoactive substance dependence, uncomplicated: Secondary | ICD-10-CM | POA: Diagnosis present

## 2018-09-11 DIAGNOSIS — J441 Chronic obstructive pulmonary disease with (acute) exacerbation: Secondary | ICD-10-CM | POA: Diagnosis present

## 2018-09-11 DIAGNOSIS — E875 Hyperkalemia: Principal | ICD-10-CM

## 2018-09-11 DIAGNOSIS — D631 Anemia in chronic kidney disease: Secondary | ICD-10-CM | POA: Diagnosis present

## 2018-09-11 DIAGNOSIS — Z8249 Family history of ischemic heart disease and other diseases of the circulatory system: Secondary | ICD-10-CM

## 2018-09-11 DIAGNOSIS — E8770 Fluid overload, unspecified: Secondary | ICD-10-CM | POA: Diagnosis present

## 2018-09-11 DIAGNOSIS — Z9115 Patient's noncompliance with renal dialysis: Secondary | ICD-10-CM

## 2018-09-11 DIAGNOSIS — N186 End stage renal disease: Secondary | ICD-10-CM | POA: Diagnosis present

## 2018-09-11 DIAGNOSIS — Z91158 Patient's noncompliance with renal dialysis for other reason: Secondary | ICD-10-CM

## 2018-09-11 DIAGNOSIS — I10 Essential (primary) hypertension: Secondary | ICD-10-CM | POA: Diagnosis present

## 2018-09-11 DIAGNOSIS — I480 Paroxysmal atrial fibrillation: Secondary | ICD-10-CM | POA: Diagnosis present

## 2018-09-11 DIAGNOSIS — Z9114 Patient's other noncompliance with medication regimen: Secondary | ICD-10-CM

## 2018-09-11 DIAGNOSIS — Z992 Dependence on renal dialysis: Secondary | ICD-10-CM

## 2018-09-11 DIAGNOSIS — R0602 Shortness of breath: Secondary | ICD-10-CM

## 2018-09-11 DIAGNOSIS — Z9119 Patient's noncompliance with other medical treatment and regimen: Secondary | ICD-10-CM

## 2018-09-11 DIAGNOSIS — F1010 Alcohol abuse, uncomplicated: Secondary | ICD-10-CM | POA: Diagnosis present

## 2018-09-11 DIAGNOSIS — I517 Cardiomegaly: Secondary | ICD-10-CM | POA: Diagnosis present

## 2018-09-11 DIAGNOSIS — I120 Hypertensive chronic kidney disease with stage 5 chronic kidney disease or end stage renal disease: Secondary | ICD-10-CM | POA: Diagnosis present

## 2018-09-11 DIAGNOSIS — R079 Chest pain, unspecified: Secondary | ICD-10-CM | POA: Diagnosis not present

## 2018-09-11 DIAGNOSIS — Z590 Homelessness: Secondary | ICD-10-CM

## 2018-09-11 DIAGNOSIS — I2510 Atherosclerotic heart disease of native coronary artery without angina pectoris: Secondary | ICD-10-CM | POA: Diagnosis present

## 2018-09-11 DIAGNOSIS — Z79899 Other long term (current) drug therapy: Secondary | ICD-10-CM

## 2018-09-11 DIAGNOSIS — N2581 Secondary hyperparathyroidism of renal origin: Secondary | ICD-10-CM | POA: Diagnosis present

## 2018-09-11 DIAGNOSIS — I440 Atrioventricular block, first degree: Secondary | ICD-10-CM | POA: Diagnosis present

## 2018-09-11 LAB — CBC
HCT: 27.9 % — ABNORMAL LOW (ref 39.0–52.0)
Hemoglobin: 8.4 g/dL — ABNORMAL LOW (ref 13.0–17.0)
MCH: 32.1 pg (ref 26.0–34.0)
MCHC: 30.1 g/dL (ref 30.0–36.0)
MCV: 106.5 fL — ABNORMAL HIGH (ref 80.0–100.0)
PLATELETS: 122 10*3/uL — AB (ref 150–400)
RBC: 2.62 MIL/uL — ABNORMAL LOW (ref 4.22–5.81)
RDW: 21.4 % — ABNORMAL HIGH (ref 11.5–15.5)
WBC: 4.3 10*3/uL (ref 4.0–10.5)
nRBC: 0 % (ref 0.0–0.2)

## 2018-09-11 LAB — I-STAT TROPONIN, ED: Troponin i, poc: 0.03 ng/mL (ref 0.00–0.08)

## 2018-09-11 LAB — BASIC METABOLIC PANEL
Anion gap: 15 (ref 5–15)
BUN: 103 mg/dL — ABNORMAL HIGH (ref 8–23)
CALCIUM: 7.3 mg/dL — AB (ref 8.9–10.3)
CO2: 21 mmol/L — ABNORMAL LOW (ref 22–32)
Chloride: 101 mmol/L (ref 98–111)
Creatinine, Ser: 19.88 mg/dL — ABNORMAL HIGH (ref 0.61–1.24)
GFR calc Af Amer: 2 mL/min — ABNORMAL LOW (ref >60)
GFR calc non Af Amer: 2 mL/min — ABNORMAL LOW (ref >60)
Glucose, Bld: 73 mg/dL (ref 70–99)
Potassium: 6.2 mmol/L — ABNORMAL HIGH (ref 3.5–5.1)
Sodium: 137 mmol/L (ref 135–145)

## 2018-09-11 IMAGING — CR DG CHEST 2V
2 series · 2 of 2 positions shown · non-contrast
Comparison: [DATE]

CLINICAL DATA: Pain across the upper chest after dialysis.

EXAM:
CHEST - 2 VIEW

[chest pa]
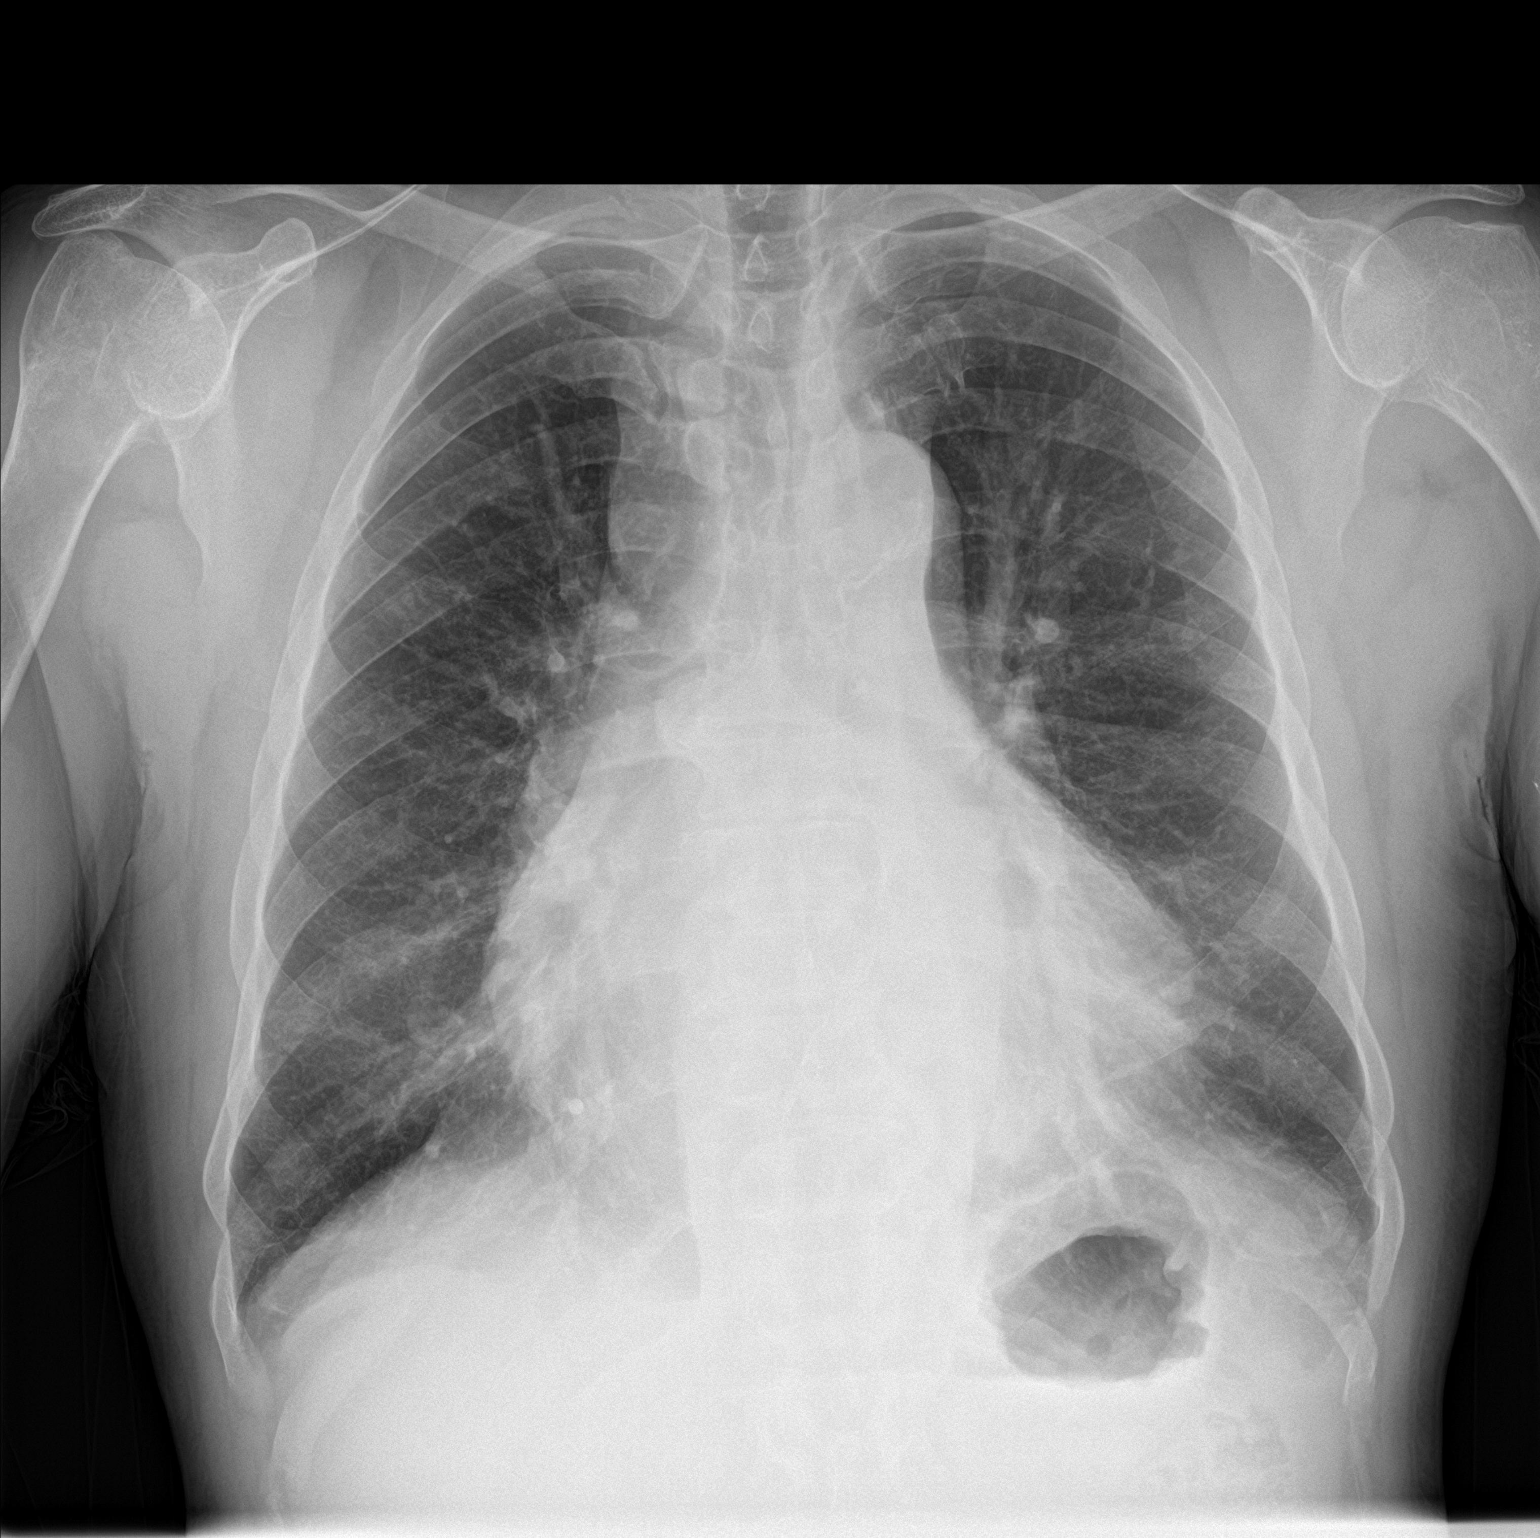

[chest lat]
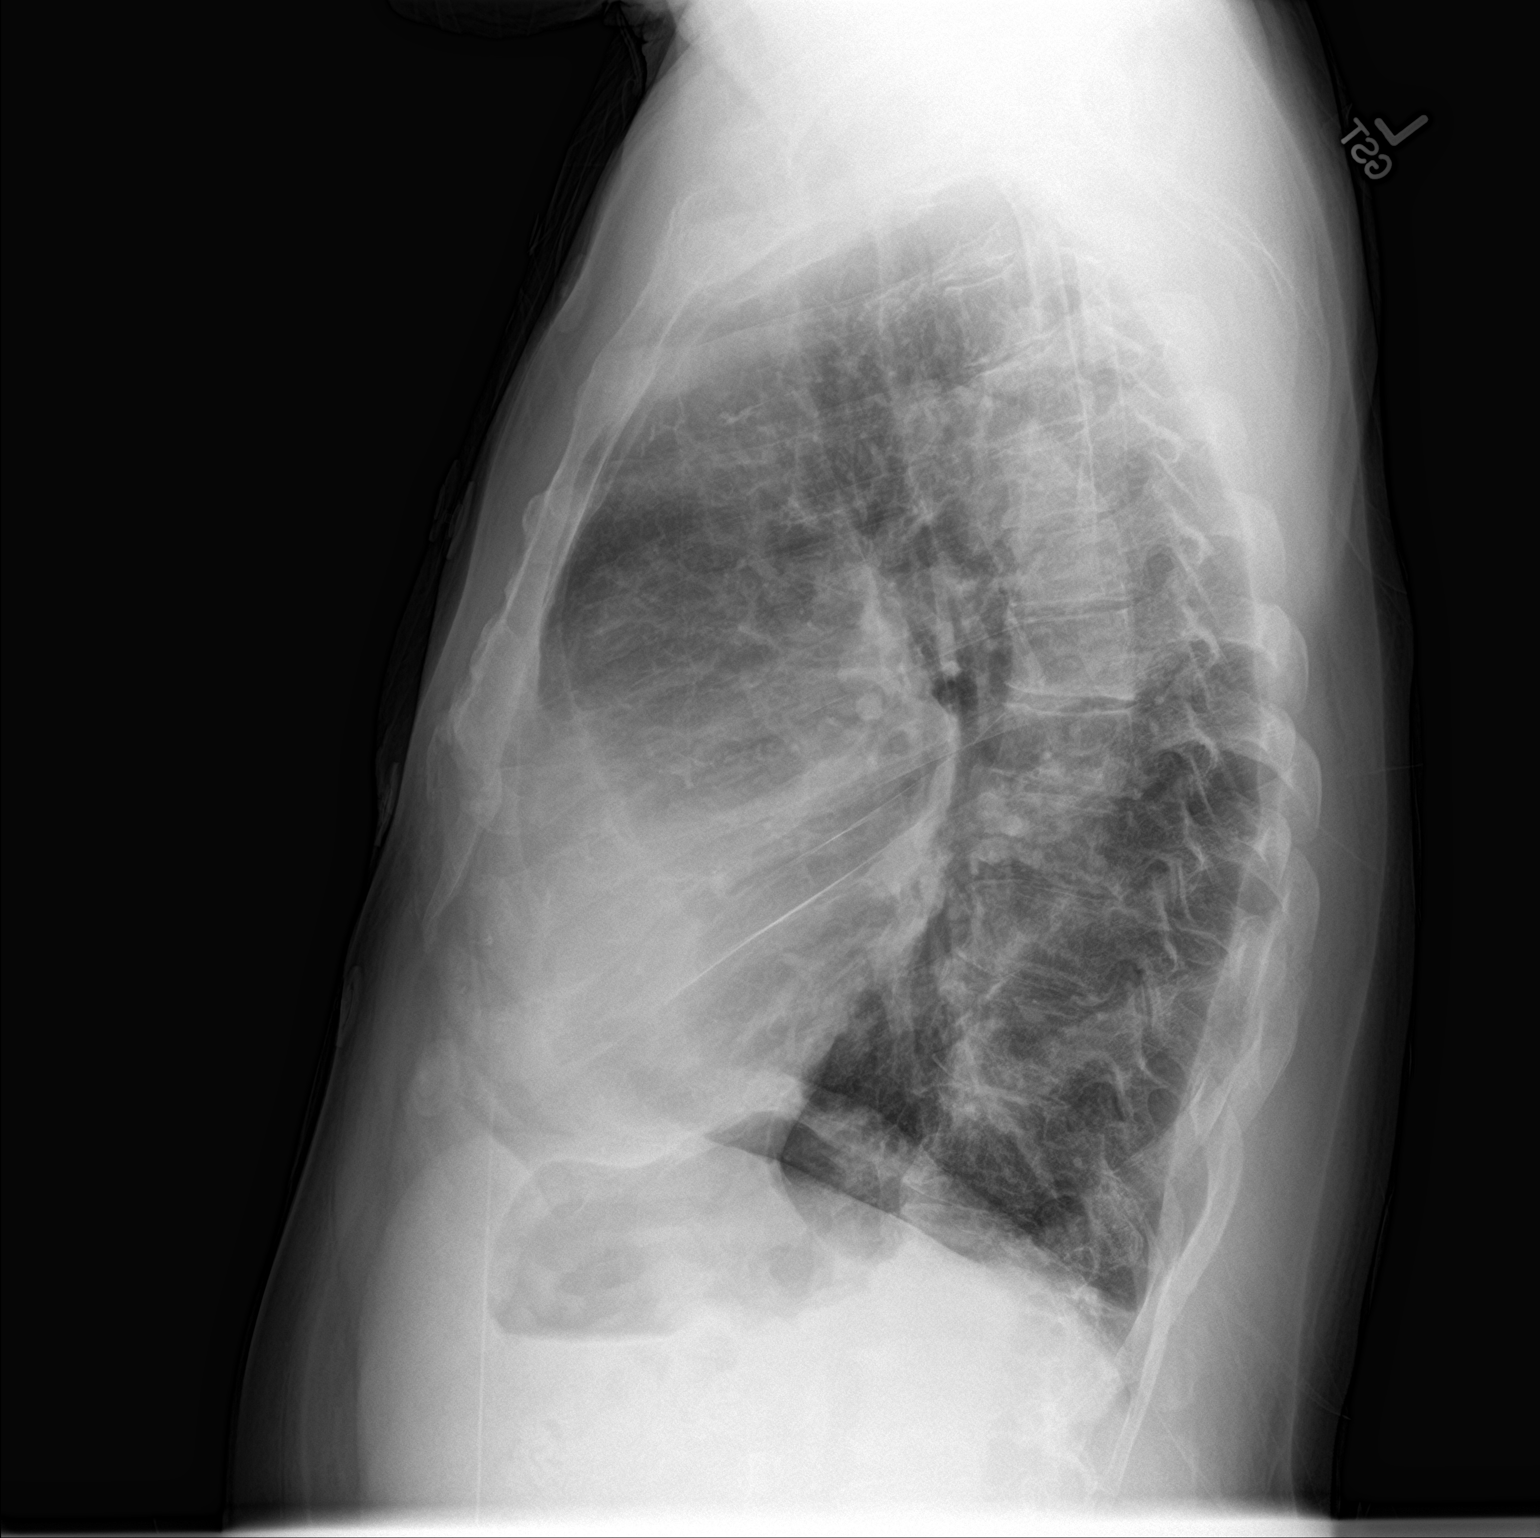

[2 of 2 positions shown; findings below may reference images not displayed]

FINDINGS: Stable cardiomegaly with left basilar atelectasis unchanged in
appearance. Aortic atherosclerosis is noted. No overt pulmonary
edema, effusion or pneumothorax. No acute nor suspicious osseous
abnormalities.
IMPRESSION: 1. No significant change in the appearance of the chest.
2. Cardiomegaly is stable with aortic atherosclerosis.
3. Streaky parenchymal opacities at the left lung base are felt to
represent atelectasis. Pneumonia is believed less likely.

## 2018-09-11 MED ORDER — SODIUM CHLORIDE 0.9% FLUSH
3.0000 mL | Freq: Once | INTRAVENOUS | Status: AC
  Administered 2018-09-12: 3 mL via INTRAVENOUS

## 2018-09-11 NOTE — ED Triage Notes (Signed)
Pt states that he is feeling SOB and CP due to not having his dialysis since last Tuesday.

## 2018-09-12 ENCOUNTER — Other Ambulatory Visit: Payer: Self-pay

## 2018-09-12 DIAGNOSIS — F1420 Cocaine dependence, uncomplicated: Secondary | ICD-10-CM | POA: Diagnosis present

## 2018-09-12 DIAGNOSIS — E8770 Fluid overload, unspecified: Secondary | ICD-10-CM | POA: Diagnosis present

## 2018-09-12 DIAGNOSIS — E8889 Other specified metabolic disorders: Secondary | ICD-10-CM | POA: Diagnosis present

## 2018-09-12 DIAGNOSIS — E875 Hyperkalemia: Principal | ICD-10-CM

## 2018-09-12 DIAGNOSIS — I517 Cardiomegaly: Secondary | ICD-10-CM | POA: Diagnosis present

## 2018-09-12 DIAGNOSIS — Z9115 Patient's noncompliance with renal dialysis: Secondary | ICD-10-CM

## 2018-09-12 DIAGNOSIS — F17210 Nicotine dependence, cigarettes, uncomplicated: Secondary | ICD-10-CM | POA: Diagnosis present

## 2018-09-12 DIAGNOSIS — I10 Essential (primary) hypertension: Secondary | ICD-10-CM

## 2018-09-12 DIAGNOSIS — Z9119 Patient's noncompliance with other medical treatment and regimen: Secondary | ICD-10-CM | POA: Diagnosis not present

## 2018-09-12 DIAGNOSIS — Z590 Homelessness: Secondary | ICD-10-CM | POA: Diagnosis not present

## 2018-09-12 DIAGNOSIS — Z992 Dependence on renal dialysis: Secondary | ICD-10-CM | POA: Diagnosis not present

## 2018-09-12 DIAGNOSIS — Z91158 Patient's noncompliance with renal dialysis for other reason: Secondary | ICD-10-CM

## 2018-09-12 DIAGNOSIS — D631 Anemia in chronic kidney disease: Secondary | ICD-10-CM

## 2018-09-12 DIAGNOSIS — I440 Atrioventricular block, first degree: Secondary | ICD-10-CM | POA: Diagnosis present

## 2018-09-12 DIAGNOSIS — I2510 Atherosclerotic heart disease of native coronary artery without angina pectoris: Secondary | ICD-10-CM | POA: Diagnosis present

## 2018-09-12 DIAGNOSIS — N186 End stage renal disease: Secondary | ICD-10-CM | POA: Diagnosis not present

## 2018-09-12 DIAGNOSIS — J9811 Atelectasis: Secondary | ICD-10-CM | POA: Diagnosis present

## 2018-09-12 DIAGNOSIS — Z9114 Patient's other noncompliance with medication regimen: Secondary | ICD-10-CM | POA: Diagnosis not present

## 2018-09-12 DIAGNOSIS — Z8249 Family history of ischemic heart disease and other diseases of the circulatory system: Secondary | ICD-10-CM | POA: Diagnosis not present

## 2018-09-12 DIAGNOSIS — I480 Paroxysmal atrial fibrillation: Secondary | ICD-10-CM | POA: Diagnosis not present

## 2018-09-12 DIAGNOSIS — F1010 Alcohol abuse, uncomplicated: Secondary | ICD-10-CM | POA: Diagnosis present

## 2018-09-12 DIAGNOSIS — R0602 Shortness of breath: Secondary | ICD-10-CM | POA: Diagnosis present

## 2018-09-12 DIAGNOSIS — J441 Chronic obstructive pulmonary disease with (acute) exacerbation: Secondary | ICD-10-CM | POA: Diagnosis present

## 2018-09-12 DIAGNOSIS — Z79899 Other long term (current) drug therapy: Secondary | ICD-10-CM | POA: Diagnosis not present

## 2018-09-12 DIAGNOSIS — I120 Hypertensive chronic kidney disease with stage 5 chronic kidney disease or end stage renal disease: Secondary | ICD-10-CM | POA: Diagnosis not present

## 2018-09-12 DIAGNOSIS — N2581 Secondary hyperparathyroidism of renal origin: Secondary | ICD-10-CM | POA: Diagnosis not present

## 2018-09-12 DIAGNOSIS — N185 Chronic kidney disease, stage 5: Secondary | ICD-10-CM | POA: Diagnosis not present

## 2018-09-12 DIAGNOSIS — Z833 Family history of diabetes mellitus: Secondary | ICD-10-CM | POA: Diagnosis not present

## 2018-09-12 LAB — BASIC METABOLIC PANEL
Anion gap: 17 — ABNORMAL HIGH (ref 5–15)
BUN: 106 mg/dL — ABNORMAL HIGH (ref 8–23)
CO2: 20 mmol/L — AB (ref 22–32)
Calcium: 7.5 mg/dL — ABNORMAL LOW (ref 8.9–10.3)
Chloride: 102 mmol/L (ref 98–111)
Creatinine, Ser: 20.41 mg/dL — ABNORMAL HIGH (ref 0.61–1.24)
GFR calc non Af Amer: 2 mL/min — ABNORMAL LOW (ref >60)
GFR, EST AFRICAN AMERICAN: 2 mL/min — AB (ref >60)
Glucose, Bld: 91 mg/dL (ref 70–99)
Potassium: 6 mmol/L — ABNORMAL HIGH (ref 3.5–5.1)
Sodium: 139 mmol/L (ref 135–145)

## 2018-09-12 LAB — CBC
HCT: 26.2 % — ABNORMAL LOW (ref 39.0–52.0)
Hemoglobin: 7.8 g/dL — ABNORMAL LOW (ref 13.0–17.0)
MCH: 31.6 pg (ref 26.0–34.0)
MCHC: 29.8 g/dL — ABNORMAL LOW (ref 30.0–36.0)
MCV: 106.1 fL — ABNORMAL HIGH (ref 80.0–100.0)
Platelets: 130 10*3/uL — ABNORMAL LOW (ref 150–400)
RBC: 2.47 MIL/uL — ABNORMAL LOW (ref 4.22–5.81)
RDW: 21.1 % — AB (ref 11.5–15.5)
WBC: 4.1 10*3/uL (ref 4.0–10.5)
nRBC: 0 % (ref 0.0–0.2)

## 2018-09-12 LAB — MRSA PCR SCREENING: MRSA by PCR: NEGATIVE

## 2018-09-12 MED ORDER — AMIODARONE HCL 200 MG PO TABS
200.0000 mg | ORAL_TABLET | Freq: Every day | ORAL | Status: DC
  Administered 2018-09-12 – 2018-09-14 (×3): 200 mg via ORAL
  Filled 2018-09-12 (×3): qty 1

## 2018-09-12 MED ORDER — DARBEPOETIN ALFA 150 MCG/0.3ML IJ SOSY
150.0000 ug | PREFILLED_SYRINGE | INTRAMUSCULAR | Status: DC
  Administered 2018-09-13: 150 ug via INTRAVENOUS
  Filled 2018-09-12: qty 0.3

## 2018-09-12 MED ORDER — SODIUM CHLORIDE 0.9 % IV SOLN: 500.0000 mg | INTRAVENOUS | Status: DC

## 2018-09-12 MED ORDER — PENTAFLUOROPROP-TETRAFLUOROETH EX AERO: 1.0000 "application " | INHALATION_SPRAY | CUTANEOUS | Status: DC | PRN

## 2018-09-12 MED ORDER — CHLORHEXIDINE GLUCONATE CLOTH 2 % EX PADS: 6.0000 | MEDICATED_PAD | Freq: Every day | CUTANEOUS | Status: DC

## 2018-09-12 MED ORDER — CALCITRIOL 0.25 MCG PO CAPS
1.5000 ug | ORAL_CAPSULE | ORAL | Status: DC
  Administered 2018-09-14: 1.5 ug via ORAL
  Filled 2018-09-12: qty 6

## 2018-09-12 MED ORDER — ACETAMINOPHEN 325 MG PO TABS: 650.0000 mg | ORAL_TABLET | Freq: Four times a day (QID) | ORAL | Status: DC | PRN

## 2018-09-12 MED ORDER — ONDANSETRON HCL 4 MG/2ML IJ SOLN: 4.0000 mg | Freq: Four times a day (QID) | INTRAMUSCULAR | Status: DC | PRN

## 2018-09-12 MED ORDER — SODIUM CHLORIDE 0.9 % IV SOLN
1.0000 g | INTRAVENOUS | Status: DC
  Administered 2018-09-12 – 2018-09-13 (×2): 1 g via INTRAVENOUS
  Filled 2018-09-12 (×3): qty 10

## 2018-09-12 MED ORDER — AMLODIPINE BESYLATE 5 MG PO TABS
2.5000 mg | ORAL_TABLET | Freq: Every day | ORAL | Status: DC
  Administered 2018-09-12 – 2018-09-14 (×3): 2.5 mg via ORAL
  Filled 2018-09-12 (×3): qty 1

## 2018-09-12 MED ORDER — ACETAMINOPHEN 650 MG RE SUPP: 650.0000 mg | Freq: Four times a day (QID) | RECTAL | Status: DC | PRN

## 2018-09-12 MED ORDER — HEPARIN SODIUM (PORCINE) 5000 UNIT/ML IJ SOLN
5000.0000 [IU] | Freq: Three times a day (TID) | INTRAMUSCULAR | Status: DC
  Administered 2018-09-12 – 2018-09-14 (×5): 5000 [IU] via SUBCUTANEOUS
  Filled 2018-09-12 (×5): qty 1

## 2018-09-12 MED ORDER — LIDOCAINE HCL (PF) 1 % IJ SOLN: 5.0000 mL | INTRAMUSCULAR | Status: DC | PRN

## 2018-09-12 MED ORDER — ALTEPLASE 2 MG IJ SOLR: 2.0000 mg | Freq: Once | INTRAMUSCULAR | Status: DC | PRN

## 2018-09-12 MED ORDER — HEPARIN SODIUM (PORCINE) 1000 UNIT/ML DIALYSIS: 1000.0000 [IU] | INTRAMUSCULAR | Status: DC | PRN

## 2018-09-12 MED ORDER — HYDRALAZINE HCL 20 MG/ML IJ SOLN: 20.0000 mg | INTRAMUSCULAR | Status: DC | PRN

## 2018-09-12 MED ORDER — SEVELAMER CARBONATE 800 MG PO TABS
3200.0000 mg | ORAL_TABLET | Freq: Three times a day (TID) | ORAL | Status: DC
  Administered 2018-09-12 – 2018-09-14 (×5): 3200 mg via ORAL
  Filled 2018-09-12 (×5): qty 4

## 2018-09-12 MED ORDER — SODIUM CHLORIDE 0.9 % IV SOLN: 100.0000 mL | INTRAVENOUS | Status: DC | PRN

## 2018-09-12 MED ORDER — LIDOCAINE-PRILOCAINE 2.5-2.5 % EX CREA: 1.0000 "application " | TOPICAL_CREAM | CUTANEOUS | Status: DC | PRN

## 2018-09-12 MED ORDER — METOPROLOL SUCCINATE ER 50 MG PO TB24
50.0000 mg | ORAL_TABLET | Freq: Every day | ORAL | Status: DC
  Administered 2018-09-12 – 2018-09-14 (×3): 50 mg via ORAL
  Filled 2018-09-12 (×3): qty 1

## 2018-09-12 MED ORDER — ONDANSETRON HCL 4 MG PO TABS: 4.0000 mg | ORAL_TABLET | Freq: Four times a day (QID) | ORAL | Status: DC | PRN

## 2018-09-12 MED ORDER — DOXYCYCLINE HYCLATE 100 MG PO TABS
100.0000 mg | ORAL_TABLET | Freq: Two times a day (BID) | ORAL | Status: DC
  Administered 2018-09-12 – 2018-09-14 (×4): 100 mg via ORAL
  Filled 2018-09-12 (×4): qty 1

## 2018-09-12 MED ORDER — SODIUM ZIRCONIUM CYCLOSILICATE 10 G PO PACK
10.0000 g | PACK | Freq: Once | ORAL | Status: AC
  Administered 2018-09-12: 10 g via ORAL
  Filled 2018-09-12: qty 1

## 2018-09-12 NOTE — Progress Notes (Signed)
Report received from [facility] ED RN.

## 2018-09-12 NOTE — Progress Notes (Signed)
Received Patient from HD via bed.  Patient is sleepy, but arouses easily to voice.  Denies pain.  Gauze dressing to left arm AVF x 2. Intact with old drainage noted; + bruie and thrill.  BBS clear thru out. VSS

## 2018-09-12 NOTE — Progress Notes (Signed)
Pt received from ED , AO x4. Coughing since 3 days. Denies any pain, breathing even and unlabored in RA. Hypertensive, connected to tele. CCMD notified. CHG bath completed. oriented pt to room and call bell system. Call bell within reach. Will continue to monitor.

## 2018-09-12 NOTE — ED Provider Notes (Signed)
[facility] EMERGENCY DEPARTMENT Provider Note   CSN: [id number] Arrival date & time: 09/11/18  2205    History   Chief Complaint Chief Complaint  Patient presents with  . Shortness of Breath    HPI Bartlett E Raymond [name] is a 63 y.o. male with history of CAD, COPD, ESRD TThSat, hypertension, substance abuse who presents with chest pain and shortness of breath.  Patient has not been to dialysis in a week.  He reports he had to get out of [facility] to avoid his drug problem.  He also reports he missed a couple treatments before last Tuesday as well.  He reports he has been coughing up some phlegm.  He denies any fevers.  He denies any abdominal pain, nausea, vomiting, new leg swelling.  He is a patient of [facility], Dr. Detterding.     HPI  Past Medical History:  Diagnosis Date  . Anemia   . CAD (coronary artery disease)    a. 03/2018: cath showing 20 to 30% stenosis along the LAD, luminal irregularities along the RCA, and angiographically normal LCx  . COPD (chronic obstructive pulmonary disease) ([place])   . Dyspnea    "with too much fluid"  . ESRD (end stage renal disease) on dialysis [facility])    "MWF; J[facility]en Rinks" (07/22/17)  . Hemodialysis patient ([place])   . Hepatitis C    Still positive s/p liver biopsy at [facility]  and interferon therapy for 6 months. Most recent lab work was on 10/24/12  . Hepatitis C    "took the tx; gone now" (12/05/2016)  Was treated  . History of blood transfusion ~ 2012/2013   "related to my kidneys; blood was low"  . Hypertension   . Substance abuse ([place])   . Thyroid disease     Patient Active Problem List   Diagnosis Date Noted  . Noncompliance of patient with renal dialysis ([place]) 09/12/2018  . Benign neoplasm of colon   . Melena   . Anemia due to chronic kidney disease, on chronic dialysis ([place])   . Acute on chronic anemia   . H/O medication noncompliance   . Polysubstance (excluding opioids) dependence ([place])   . Atrial flutter  with rapid ventricular response ([place]) 08/10/2018  . PAF (paroxysmal atrial fibrillation) ([place]) 08/01/2018  . Acute respiratory failure with hypoxia ([place]) 07/13/2018  . Non-compliance with renal dialysis ([place]) 07/13/2018  . PAD (peripheral artery disease) ([place]) 04/11/2018  . Sepsis ([place]) 03/31/2018  . Preop cardiovascular exam 03/29/2018  . Volume overload 08/07/2017  . Acute pulmonary edema (H[facility])   . Dyspnea 06/20/2017  . Acute bronchitis   . COPD (chronic obstructive pulmonary disease) ([place])   . End-stage renal disease on hemodialysis ([place])   . Hypoxia 12/05/2016  . Hyperkalemia 12/19/2015  . Hypoglycemia 12/19/2015  . Polysubstance abuse ([place]) 12/19/2015  . Homeless 12/19/2015  . Anemia associated with stage 5 chronic renal failure ([place]) 12/19/2015  . Atypical chest pain 12/19/2013  . Atherosclerosis of native arteries of extremity with intermittent claudication ([place]) 12/04/2012  . ESRD (end stage renal disease) on dialysis ([place]) 12/04/2012  . HEPATITIS C 09/07/2006  . HYPERCHOLESTEROLEMIA 09/07/2006  . HYPERTENSION, BENIGN SYSTEMIC 09/07/2006    Past Surgical History:  Procedure Laterality Date  . ABDOMINAL AORTOGRAM W/LOWER EXTREMITY N/A 02/08/2018   Procedure: ABDOMINAL AORTOGRAM W/LOWER EXTREMITY;  Surgeon: Conrad [place] Hills, MD;  Location: [facility] CV LAB;  Service: Cardiovascular;  Laterality: N/A;  . AV FISTULA PLACEMENT Left Aug. 2013 ?  [name]  COLONOSCOPY WITH PROPOFOL N/A 08/21/2018   Procedure: COLONOSCOPY WITH PROPOFOL;  Surgeon: Yetta Flock, MD;  Location: [place];  Service: Gastroenterology;  Laterality: N/A;  . ESOPHAGOGASTRODUODENOSCOPY (EGD) WITH PROPOFOL N/A 08/20/2018   Procedure: ESOPHAGOGASTRODUODENOSCOPY (EGD) WITH PROPOFOL;  Surgeon: Gatha Mayer, MD;  Location: [place];  Service: Endoscopy;  Laterality: N/A;  . FEMORAL-POPLITEAL BYPASS GRAFT Left 04/11/2018   Procedure: BYPASS GRAFT FEMORAL-POPLITEAL ARTERY LEFT LEG;  Surgeon: Rosetta Posner,  MD;  Location: [place];  Service: Vascular;  Laterality: Left;  . HOT HEMOSTASIS N/A 08/20/2018   Procedure: HOT HEMOSTASIS (ARGON PLASMA COAGULATION/BICAP);  Surgeon: Gatha Mayer, MD;  Location: [facility] ENDOSCOPY;  Service: Endoscopy;  Laterality: N/A;  . LEFT HEART CATH AND CORONARY ANGIOGRAPHY N/A 03/29/2018   Procedure: LEFT HEART CATH AND CORONARY ANGIOGRAPHY;  Surgeon: Nelva Bush, MD;  Location: [place] CV LAB;  Service: Cardiovascular;  Laterality: N/A;  . LIVER BIOPSY    . ORIF ULNAR FRACTURE Left 05/18/2017   Procedure: OPEN REDUCTION INTERNAL FIXATION (ORIF) ULNAR FRACTURE;  Surgeon: Iran Planas, MD;  Location: [place];  Service: Orthopedics;  Laterality: Left;  . POLYPECTOMY  08/21/2018   Procedure: POLYPECTOMY;  Surgeon: Yetta Flock, MD;  Location[facility]COPY;  Service: Gastroenterology;;  . SHUNTOGRAM N/A 12/11/2012   Procedure: Earney Mallet;  Surgeon: Serafina Mitchell, MD;  Location: [facility] CATH LAB;  Service: Cardiovascular;  Laterality: N/A;        Home Medications    Prior to Admission medications   Medication Sig Start Date End Date Taking? Authorizing Provider  acetaminophen (TYLENOL) 325 MG tablet Take 650 mg by mouth every 6 (six) hours as needed for mild pain.   Yes [provider]  amiodarone (PACERONE) 200 MG tablet Take 1 tablet (200 mg total) by mouth daily. 08/13/18  Yes Cherene Altes, MD  amLODipine (NORVASC) 2.5 MG tablet Take 2.5 mg by mouth daily.   Yes [provider]  metoprolol succinate (TOPROL-XL) 50 MG 24 hr tablet Take 50 mg by mouth daily. Take with or immediately following a meal.   Yes [provider]    Family History Family History  Problem Relation Age of Onset  . Diabetes Father   . Hypertension Father   . Heart disease Father     Social History Social History   Tobacco Use  . Smoking status: Current Every Day Smoker    Packs/day: 1.00    Years: 25.00    Pack years: 25.00    Types: Cigarettes     Last attempt to quit: 08/01/2011    Years since quitting: 7.1  . Smokeless tobacco: Never Used  . Tobacco comment: he smokes 6 cigarettes a week  Substance Use Topics  . Alcohol use: Yes    Comment: occasionally/ one half pint  . Drug use: Yes    Types: Cocaine    Comment: he used yesterday     Allergies   Patient has no known allergies.   Review of Systems Review of Systems  Constitutional: Negative for chills and fever.  HENT: Negative for facial swelling and sore throat.   Respiratory: Positive for cough and shortness of breath.   Cardiovascular: Positive for chest pain. Negative for leg swelling.  Gastrointestinal: Negative for abdominal pain, nausea and vomiting.  Genitourinary: Negative for dysuria.  Musculoskeletal: Negative for back pain.  Skin: Negative for rash and wound.  Neurological: Negative for headaches.  Psychiatric/Behavioral: The patient is not nervous/anxious.  Physical Exam Updated Vital Signs BP (!) 188/108   Pulse 88   Temp (!) 97.4 F (36.3 C) (Oral)   Resp 16   SpO2 100%   Physical Exam Vitals signs and nursing note reviewed.  Constitutional:      General: He is not in acute distress.    Appearance: He is well-developed. He is not diaphoretic.  HENT:     Head: Normocephalic and atraumatic.     Mouth/Throat:     Pharynx: No oropharyngeal exudate.  Eyes:     General: No scleral icterus.       Right eye: No discharge.        Left eye: No discharge.     Conjunctiva/sclera: Conjunctivae normal.     Pupils: Pupils are equal, round, and reactive to light.  Neck:     Musculoskeletal: Normal range of motion and neck supple.     Thyroid: No thyromegaly.  Cardiovascular:     Rate and Rhythm: Normal rate and regular rhythm.     Heart sounds: Normal heart sounds. No murmur. No friction rub. No gallop.   Pulmonary:     Effort: Pulmonary effort is normal. No respiratory distress.     Breath sounds: Normal breath sounds. No stridor. No  wheezing or rales.  Abdominal:     General: Bowel sounds are normal. There is no distension.     Palpations: Abdomen is soft.     Tenderness: There is no abdominal tenderness. There is no guarding or rebound.  Musculoskeletal:     Right lower leg: He exhibits no tenderness. No edema.     Left lower leg: He exhibits no tenderness. Edema (chronic) present.  Lymphadenopathy:     Cervical: No cervical adenopathy.  Skin:    General: Skin is warm and dry.     Coloration: Skin is not pale.     Findings: No rash.  Neurological:     Mental Status: He is alert.     Coordination: Coordination normal.      ED Treatments / Results  Labs (all labs ordered are listed, but only abnormal results are displayed) Labs Reviewed  BASIC METABOLIC PANEL - Abnormal; Notable for the following components:      Result Value   Potassium 6.2 (*)    CO2 21 (*)    BUN 103 (*)    Creatinine, Ser 19.88 (*)    Calcium 7.3 (*)    GFR calc non Af Amer 2 (*)    GFR calc Af Amer 2 (*)    All other components within normal limits  CBC - Abnormal; Notable for the following components:   RBC 2.62 (*)    Hemoglobin 8.4 (*)    HCT 27.9 (*)    MCV 106.5 (*)    RDW 21.4 (*)    Platelets 122 (*)    All other components within normal limits  BASIC METABOLIC PANEL  I-STAT TROPONIN, ED    EKG EKG Interpretation  Date/Time:  Tuesday September 11 2018 22:22:13 EST Ventricular Rate:  90 PR Interval:  228 QRS Duration: 88 QT Interval:  424 QTC Calculation: 518 R Axis:   15 Text Interpretation:  Sinus rhythm with 1st degree A-V block Prolonged QT Abnormal ECG No significant change since last tracing Confirmed by Deno Etienne [phone]) on 09/12/2018 12:58:48 AM   Radiology Dg Chest 2 View  Result Date: 09/11/2018 CLINICAL DATA:  Pain across the upper chest after dialysis. EXAM: CHEST - 2 VIEW COMPARISON:  08/27/2018  FINDINGS: Stable cardiomegaly with left basilar atelectasis unchanged in appearance. Aortic  atherosclerosis is noted. No overt pulmonary edema, effusion or pneumothorax. No acute nor suspicious osseous abnormalities. IMPRESSION: 1. No significant change in the appearance of the chest. 2. Cardiomegaly is stable with aortic atherosclerosis. 3. Streaky parenchymal opacities at the left lung base are felt to represent atelectasis. Pneumonia is believed less likely. Electronically Signed   By: Ashley Royalty M.D.   On: 09/11/2018 23:24    Procedures Procedures (including critical care time)  Medications Ordered in ED Medications  acetaminophen (TYLENOL) tablet 650 mg (has no administration in time range)    Or  acetaminophen (TYLENOL) suppository 650 mg (has no administration in time range)  ondansetron (ZOFRAN) tablet 4 mg (has no administration in time range)    Or  ondansetron (ZOFRAN) injection 4 mg (has no administration in time range)  heparin injection 5,000 Units (has no administration in time range)  hydrALAZINE (APRESOLINE) injection 20 mg (has no administration in time range)  amiodarone (PACERONE) tablet 200 mg (has no administration in time range)  amLODipine (NORVASC) tablet 2.5 mg (has no administration in time range)  metoprolol succinate (TOPROL-XL) 24 hr tablet 50 mg (has no administration in time range)  sodium chloride flush (NS) 0.9 % injection 3 mL (3 mLs Intravenous Given 09/12/18 0201)  sodium zirconium cyclosilicate (LOKELMA) packet 10 g (10 g Oral Given 09/12/18 0201)     Initial Impression / Assessment and Plan / ED Course  I have reviewed the triage vital signs and the nursing notes.  Pertinent labs & imaging results that were available during my care of the patient were reviewed by me and considered in my medical decision making (see chart for details).        Patient presenting with chest pain and shortness of breath after not having dialysis for a week.  Potassium is 6.2.  No EKG changes seen, EKG is stable from last tracing.  Troponin is negative.   Patient not hypoxic.  He is in no acute distress.  I discussed patient case with Dr. Hollie Salk, nephrologist on-call, who advised admission to medicine and serial dialysis.  She will arrange this and she also advised starting Lokelma.  I discussed patient case with Dr. Alcario Drought with [facility] who accepts patient for admission.  I appreciate the above consultants for their assistance with the patient.  Patient understands and agrees with plan.  Final Clinical Impressions(s) / ED Diagnoses   Final diagnoses:  SOB (shortness of breath)  Hyperkalemia  Noncompliance of patient with renal dialysis [facility])    ED Discharge Orders    None       Frederica Kuster, PA-C 09/12/18 [place], [place], DO 09/12/18 [phone]

## 2018-09-12 NOTE — H&P (Signed)
History and Physical    Raymond [name] DOB: 29-Nov-1955 DOA: 09/11/2018  PCP: Raymond Side., FNP  Patient coming from: Homeless  I have personally briefly reviewed patient's old medical records in [place]  Chief Complaint: Missed dialysis  HPI: Raymond [name] is a 63 y.o. male with medical history significant of ESRD dialysis TTS, PAF off of anticoagulants, substance abuse, homeless.  Patient with extensive history of missed dialysis sessions in past.  Patient presents to ED with c/o CP and SOB.  He states he hasnt been to dialysis since last Tuesday (8 days ago).  Missed a couple treatments before last Tuesday as well.  Has been coughing up some phlem, denies any fevers.  Symptoms are typical of when he misses too many dialysis sessions and gets fluid overloaded.  Of note and somewhat out of the usual for this patient, he did have an admit last month for GI bleed ultimately believed to be due to AVMs.  Taken off of coumadin due to concern that risks outweighed the benefits until he got a PCP to follow up with.   ED Course: K 6.2.  BUN 103, HGB 8.4.  No EKG changes.   Review of Systems: As per HPI otherwise 10 point review of systems negative.   Past Medical History:  Diagnosis Date  . Anemia   . CAD (coronary artery disease)    a. 03/2018: cath showing 20 to 30% stenosis along the LAD, luminal irregularities along the RCA, and angiographically normal LCx  . COPD (chronic obstructive pulmonary disease) ([place])   . Dyspnea    "with too much fluid"  . ESRD (end stage renal disease) on dialysis [facility])    "MWF; Jeneen Rinks" (07/22/17)  . Hemodialysis patient ([place])   . Hepatitis C    Still positive s/p liver biopsy at [facility]  and interferon therapy for 6 months. Most recent lab work was on 10/24/12  . Hepatitis C    "took the tx; gone now" (12/05/2016)  Was treated  . History of blood transfusion ~ 2012/2013   "related to my kidneys; blood was low"  . Hypertension    . Substance abuse ([place])   . Thyroid disease     Past Surgical History:  Procedure Laterality Date  . ABDOMINAL AORTOGRAM W/LOWER EXTREMITY N/A 02/08/2018   Procedure: ABDOMINAL AORTOGRAM W/LOWER EXTREMITY;  Surgeon: Raymond Hurley, MD;  Location: [place] CV LAB;  Service: Cardiovascular;  Laterality: N/A;  . AV FISTULA PLACEMENT Left Aug. 2013 ?  . COLONOSCOPY WITH PROPOFOL N/A 08/21/2018   Procedure: COLONOSCOPY WITH PROPOFOL;  Surgeon: Raymond Flock, MD;  Location: [place];  Service: Gastroenterology;  Laterality: N/A;  . ESOPHAGOGASTRODUODENOSCOPY (EGD) WITH PROPOFOL N/A 08/20/2018   Procedure: ESOPHAGOGASTRODUODENOSCOPY (EGD) WITH PROPOFOL;  Surgeon: Raymond Mayer, MD;  Location: [place];  Service: Endoscopy;  Laterality: N/A;  . FEMORAL-POPLITEAL BYPASS GRAFT Left 04/11/2018   Procedure: BYPASS GRAFT FEMORAL-POPLITEAL ARTERY LEFT LEG;  Surgeon: Raymond Posner, MD;  Location: [place];  Service: Vascular;  Laterality: Left;  . HOT HEMOSTASIS N/A 08/20/2018   Procedure: HOT HEMOSTASIS (ARGON PLASMA COAGULATION/BICAP);  Surgeon: Raymond Mayer, MD;  Location: [facility] ENDOSCOPY;  Service: Endoscopy;  Laterality: N/A;  . LEFT HEART CATH AND CORONARY ANGIOGRAPHY N/A 03/29/2018   Procedure: LEFT HEART CATH AND CORONARY ANGIOGRAPHY;  Surgeon: Raymond Bush, MD;  Location: [place] CV LAB;  Service: Cardiovascular;  Laterality: N/A;  . LIVER BIOPSY    .  ORIF ULNAR FRACTURE Left 05/18/2017   Procedure: OPEN REDUCTION INTERNAL FIXATION (ORIF) ULNAR FRACTURE;  Surgeon: Raymond Planas, MD;  Location: [place];  Service: Orthopedics;  Laterality: Left;  . POLYPECTOMY  08/21/2018   Procedure: POLYPECTOMY;  Surgeon: Raymond Flock, MD;  Location: [facility] ENDOSCOPY;  Service: Gastroenterology;;  . SHUNTOGRAM N/A 12/11/2012   Procedure: Raymond [name];  Surgeon: Raymond Mitchell, MD;  Location: [facility] CATH LAB;  Service: Cardiovascular;  Laterality: N/A;     reports that he has been smoking cigarettes.  He has a 25.00 pack-year smoking history. He has never used smokeless tobacco. He reports current alcohol use. He reports current drug use. Drug: Cocaine.  No Known Allergies  Family History  Problem Relation Age of Onset  . Diabetes Father   . Hypertension Father   . Heart disease Father      Prior to Admission medications   Medication Sig Start Date End Date Taking? Authorizing Provider  acetaminophen (TYLENOL) 325 MG tablet Take 650 mg by mouth every 6 (six) hours as needed for mild pain.   Yes [provider]  amiodarone (PACERONE) 200 MG tablet Take 1 tablet (200 mg total) by mouth daily. 08/13/18  Yes Raymond Altes, MD  amLODipine (NORVASC) 2.5 MG tablet Take 2.5 mg by mouth daily.   Yes [provider]  metoprolol succinate (TOPROL-XL) 50 MG 24 hr tablet Take 50 mg by mouth daily. Take with or immediately following a meal.   Yes [provider]    Physical Exam: Vitals:   09/11/18 2217 09/12/18 0105 09/12/18 0206 09/12/18 0207  BP: (!) 182/100 (!) 181/112 (!) 188/108   Pulse: 92 87  88  Resp: 20 18 11 16   Temp: (!) 97.4 F (36.3 C)     TempSrc: Oral     SpO2: 99% 100%  100%    Constitutional: NAD, calm, comfortable Eyes: PERRL, lids and conjunctivae normal ENMT: Mucous membranes are moist. Posterior pharynx clear of any exudate or lesions.Normal dentition.  Neck: normal, supple, no masses, no thyromegaly Respiratory: clear to auscultation bilaterally, no wheezing, no crackles. Normal respiratory effort. No accessory muscle use.  Cardiovascular: Regular rate and rhythm, no murmurs / rubs / gallops. No extremity edema. 2+ pedal pulses. No carotid bruits.  Abdomen: no tenderness, no masses palpated. No hepatosplenomegaly. Bowel sounds positive.  Musculoskeletal: no clubbing / cyanosis. No joint deformity upper and lower extremities. Good ROM, no contractures. Normal muscle tone.  Skin: no rashes, lesions, ulcers. No induration Neurologic: CN  2-12 grossly intact. Sensation intact, DTR normal. Strength 5/5 in all 4.  Psychiatric: Normal judgment and insight. Alert and oriented x 3. Normal mood.    Labs on Admission: I have personally reviewed following labs and imaging studies  CBC: Recent Labs  Lab 09/11/18 2222  WBC 4.3  HGB 8.4*  HCT 27.9*  MCV 106.5*  PLT 308*   Basic Metabolic Panel: Recent Labs  Lab 09/11/18 2222  NA 137  K 6.2*  CL 101  CO2 21*  GLUCOSE 73  BUN 103*  CREATININE 19.88*  CALCIUM 7.3*   GFR: CrCl cannot be calculated (Unknown ideal weight.). Liver Function Tests: No results for input(s): AST, ALT, ALKPHOS, BILITOT, PROT, ALBUMIN in the last 168 hours. No results for input(s): LIPASE, AMYLASE in the last 168 hours. No results for input(s): AMMONIA in the last 168 hours. Coagulation Profile: No results for input(s): INR, PROTIME in the last 168 hours. Cardiac Enzymes: No results for input(s): CKTOTAL, CKMB, CKMBINDEX,  TROPONINI in the last 168 hours. BNP (last 3 results) No results for input(s): PROBNP in the last 8760 hours. HbA1C: No results for input(s): HGBA1C in the last 72 hours. CBG: No results for input(s): GLUCAP in the last 168 hours. Lipid Profile: No results for input(s): CHOL, HDL, LDLCALC, TRIG, CHOLHDL, LDLDIRECT in the last 72 hours. Thyroid Function Tests: No results for input(s): TSH, T4TOTAL, FREET4, T3FREE, THYROIDAB in the last 72 hours. Anemia Panel: No results for input(s): VITAMINB12, FOLATE, FERRITIN, TIBC, IRON, RETICCTPCT in the last 72 hours. Urine analysis:    Component Value Date/Time   COLORURINE YELLOW 05/08/2010 [place] 05/08/2010 1335   LABSPEC 1.009 05/08/2010 1335   PHURINE 6.0 05/08/2010 1335   GLUCOSEU NEGATIVE 05/08/2010 1335   HGBUR NEGATIVE 05/08/2010 1335   BILIRUBINUR NEGATIVE 05/08/2010 1335   KETONESUR NEGATIVE 05/08/2010 1335   PROTEINUR 100 (A) 05/08/2010 1335   UROBILINOGEN 0.2 05/08/2010 1335   NITRITE  NEGATIVE 05/08/2010 1335   LEUKOCYTESUR SMALL (A) 05/08/2010 1335    Radiological Exams on Admission: Dg Chest 2 View  Result Date: 09/11/2018 CLINICAL DATA:  Pain across the upper chest after dialysis. EXAM: CHEST - 2 VIEW COMPARISON:  08/27/2018 FINDINGS: Stable cardiomegaly with left basilar atelectasis unchanged in appearance. Aortic atherosclerosis is noted. No overt pulmonary edema, effusion or pneumothorax. No acute nor suspicious osseous abnormalities. IMPRESSION: 1. No significant change in the appearance of the chest. 2. Cardiomegaly is stable with aortic atherosclerosis. 3. Streaky parenchymal opacities at the left lung base are felt to represent atelectasis. Pneumonia is believed less likely. Electronically Signed   By: Ashley Royalty M.D.   On: 09/11/2018 23:24    EKG: Independently reviewed.  Assessment/Plan Principal Problem:   Hyperkalemia Active Problems:   HYPERTENSION, BENIGN SYSTEMIC   Anemia associated with stage 5 chronic renal failure (HCC)   End-stage renal disease on hemodialysis (HCC)   PAF (paroxysmal atrial fibrillation) (HCC)   Polysubstance (excluding opioids) dependence ([place])   Noncompliance of patient with renal dialysis ([place])    1. Hyperkalemia, chest pain, HTN and fluid overload due to missed dialysis sessions - 1. EDP spoke with nephrology 1. Getting dose of lokelma now, didn't recommend other interventions at this time for K of 6.2. 2. Dialysis in AM 2. Tele monitor 3. Repeat BMP in AM (about 2.5 hours from now) 2. HTN -  1. Will try to treat HTN with PRN hydralazine 2. Continue home toprolol 3. Amlodipine 3. PAF - 1. Continue amiodarone 2. And toprolol 3. Not on anticoagulants until he can establish with PCP, GI felt he was too high risk without follow up to resume coumadin until he established with someone 4. Homelessness - 1. SW consult 5. Colonoscopy showed 2 adenomas - needs repeat colonoscopy in 5 years  DVT prophylaxis: Heparin [place] Code  Status: Full Family Communication: No family in room Disposition Plan: Home after admit Consults called: nephrology Admission status: Admit to inpatient  Severity of Illness: The appropriate patient status for this patient is INPATIENT. Inpatient status is judged to be reasonable and necessary in order to provide the required intensity of service to ensure the patient's safety. The patient's presenting symptoms, physical exam findings, and initial radiographic and laboratory data in the context of their chronic comorbidities is felt to place them at high risk for further clinical deterioration. Furthermore, it is not anticipated that the patient will be medically stable for discharge from the hospital within 2 midnights of admission. The following factors  support the patient status of inpatient.   K 6.2 and fluid overload with BP 188/110 after missing 3 dialysis sessions this past week.  Per nephrologist he is likely to require multiple sessions of dialysis.  * I certify that at the point of admission it is my clinical judgment that the patient will require inpatient hospital care spanning beyond 2 midnights from the point of admission due to high intensity of service, high risk for further deterioration and high frequency of surveillance required.*    ,  M. DO [facility]  How to contact the [facility] Attending or Consulting provider [place] or covering provider during after hours [place], for this patient?  1. Check the care team in [facility] and look for a) attending/consulting TRH provider listed and b) the [facility] team listed 2. Log into [email or web address]  Amion Physician Scheduling and messaging for groups and whole hospitals  On call and physician scheduling software for group practices, residents, hospitalists and other medical providers for call, clinic, rotation and shift schedules. OnCall Enterprise is a hospital-wide system for scheduling doctors and paging doctors on call. EasyPlot  is for scientific plotting and data analysis.  [email or web address]  and use [place]'s universal password to access. If you do not have the password, please contact the hospital operator.  3. Locate the [facility] provider you are looking for under [facility] and page to a number that you can be directly reached. 4. If you still have difficulty reaching the provider, please page the [facility] (Director on Call) for the Hospitalists listed on amion for assistance.  09/12/2018, 2:11 AM

## 2018-09-12 NOTE — ED Notes (Signed)
ED TO INPATIENT HANDOFF REPORT  ED Nurse Name and Phone #: Suezanne Jacquet [phone]  S Name/Age/Gender Raymond [name] 63 y.o. male Room/Bed: 031C/031C  Code Status   Code Status: Full Code  Home/SNF/Other Home Patient oriented to: self, place, time and situation Is this baseline? Yes   Triage Complete: Triage complete  Chief Complaint DIFFICULTY BREATHING (DIALYSIS PATIENT)  Triage Note Pt states that he is feeling SOB and CP due to not having his dialysis since last Tuesday.     Allergies No Known Allergies  Level of Care/Admitting Diagnosis ED Disposition    ED Disposition Condition [place] [facility] Area: [place] [100100]  Level of Care: Cardiac Telemetry [103]  Diagnosis: Noncompliance of patient with renal dialysis [facility]) [[id number]]  Admitting Physician: Doreatha Massed  Attending Physician: Etta Quill [phone]  Estimated length of stay: past midnight tomorrow  Certification:: I certify this patient will need inpatient services for at least 2 midnights  PT Class (Do Not Modify): Inpatient [101]  PT Acc Code (Do Not Modify): Private [1]       B Medical/Surgery History Past Medical History:  Diagnosis Date  . Anemia   . CAD (coronary artery disease)    a. 03/2018: cath showing 20 to 30% stenosis along the LAD, luminal irregularities along the RCA, and angiographically normal LCx  . COPD (chronic obstructive pulmonary disease) ([place])   . Dyspnea    "with too much fluid"  . ESRD (end stage renal disease) on dialysis [facility])    "MWF; Jeneen Rinks" (07/22/17)  . Hemodialysis patient ([place])   . Hepatitis C    Still positive s/p liver biopsy at [facility]  and interferon therapy for 6 months. Most recent lab work was on 10/24/12  . Hepatitis C    "took the tx; gone now" (12/05/2016)  Was treated  . History of blood transfusion ~ 2012/2013   "related to my kidneys; blood was low"  . Hypertension   . Substance abuse ([place])   . Thyroid disease     Past Surgical History:  Procedure Laterality Date  . ABDOMINAL AORTOGRAM W/LOWER EXTREMITY N/A 02/08/2018   Procedure: ABDOMINAL AORTOGRAM W/LOWER EXTREMITY;  Surgeon: Conrad Ludden, MD;  Location: [place] CV LAB;  Service: Cardiovascular;  Laterality: N/A;  . AV FISTULA PLACEMENT Left Aug. 2013 ?  . COLONOSCOPY WITH PROPOFOL N/A 08/21/2018   Procedure: COLONOSCOPY WITH PROPOFOL;  Surgeon: Yetta Flock, MD;  Location: [place];  Service: Gastroenterology;  Laterality: N/A;  . ESOPHAGOGASTRODUODENOSCOPY (EGD) WITH PROPOFOL N/A 08/20/2018   Procedure: ESOPHAGOGASTRODUODENOSCOPY (EGD) WITH PROPOFOL;  Surgeon: Gatha Mayer, MD;  Location: [place];  Service: Endoscopy;  Laterality: N/A;  . FEMORAL-POPLITEAL BYPASS GRAFT Left 04/11/2018   Procedure: BYPASS GRAFT FEMORAL-POPLITEAL ARTERY LEFT LEG;  Surgeon: Rosetta Posner, MD;  Location: [place];  Service: Vascular;  Laterality: Left;  . HOT HEMOSTASIS N/A 08/20/2018   Procedure: HOT HEMOSTASIS (ARGON PLASMA COAGULATION/BICAP);  Surgeon: Gatha Mayer, MD;  Location: [facility] ENDOSCOPY;  Service: Endoscopy;  Laterality: N/A;  . LEFT HEART CATH AND CORONARY ANGIOGRAPHY N/A 03/29/2018   Procedure: LEFT HEART CATH AND CORONARY ANGIOGRAPHY;  Surgeon: Nelva Bush, MD;  Location: [place] CV LAB;  Service: Cardiovascular;  Laterality: N/A;  . LIVER BIOPSY    . ORIF ULNAR FRACTURE Left 05/18/2017   Procedure: OPEN REDUCTION INTERNAL FIXATION (ORIF) ULNAR FRACTURE;  Surgeon: Iran Planas, MD;  Location: [place];  Service: Orthopedics;  Laterality:  Left;  . POLYPECTOMY  08/21/2018   Procedure: POLYPECTOMY;  Surgeon: Yetta Flock, MD;  Location: [facility] ENDOSCOPY;  Service: Gastroenterology;;  . SHUNTOGRAM N/A 12/11/2012   Procedure: Earney Mal[place];  Surgeon: Serafina Mitchell, MD;  Location: [facility] CATH LAB;  Service: Cardiovascular;  Laterality: N/A;     A IV Location/Drains/Wounds Patient Lines/Drains/Airways Status   Active  Line/Drains/Airways    Name:   Placement date:   Placement time:   Site:   Days:   Peripheral IV 09/12/18 Right Forearm   09/12/18    0202    Forearm   less than 1   Fistula / Graft Left Forearm Arteriovenous fistula   -    -    Forearm             Intake/Output Last 24 hours No intake or output data in the 24 hours ending 09/12/18 0351  Labs/Imaging Results for orders placed or performed during the hospital encounter of 09/11/18 (from the past 48 hour(s))  Basic metabolic panel     Status: Abnormal   Collection Time: 09/11/18 10:22 PM  Result Value Ref Range   Sodium 137 135 - 145 mmol/L   Potassium 6.2 (H) 3.5 - 5.1 mmol/L   Chloride 101 98 - 111 mmol/L   CO2 21 (L) 22 - 32 mmol/L   Glucose, Bld 73 70 - 99 mg/dL   BUN 103 (H) 8 - 23 mg/dL   Creatinine, Ser 19.88 (H) 0.61 - 1.24 mg/dL   Calcium 7.3 (L) 8.9 - 10.3 mg/dL   GFR calc non Af Amer 2 (L) >60 mL/min   GFR calc Af Amer 2 (L) >60 mL/min   Anion gap 15 5 - 15    Comment: Performed at [facility], [address], [place], [place] [address]  CBC     Status: Abnormal   Collection Time: 09/11/18 10:22 PM  Result Value Ref Range   WBC 4.3 4.0 - 10.5 K/uL   RBC 2.62 (L) 4.22 - 5.81 MIL/uL   Hemoglobin 8.4 (L) 13.0 - 17.0 g/dL   HCT 27.9 (L) 39.0 - 52.0 %   MCV 106.5 (H) 80.0 - 100.0 fL   MCH 32.1 26.0 - 34.0 pg   MCHC 30.1 30.0 - 36.0 g/dL   RDW 21.4 (H) 11.5 - 15.5 %   Platelets 122 (L) 150 - 400 K/uL   nRBC 0.0 0.0 - 0.2 %    Comment: Performed at [facility] [address], [place], [place] [address]  I-stat troponin, ED     Status: None   Collection Time: 09/11/18 10:41 PM  Result Value Ref Range   Troponin i, poc 0.03 0.00 - 0.08 ng/mL   Comment 3            Comment: Due to the release kinetics of cTnI, a negative result within the first hours of the onset of symptoms does not rule out myocardial infarction with certainty. If myocardial infarction is still suspected, repeat the test at  appropriate intervals.    Dg Chest 2 View  Result Date: 09/11/2018 CLINICAL DATA:  Pain across the upper chest after dialysis. EXAM: CHEST - 2 VIEW COMPARISON:  08/27/2018 FINDINGS: Stable cardiomegaly with left basilar atelectasis unchanged in appearance. Aortic atherosclerosis is noted. No overt pulmonary edema, effusion or pneumothorax. No acute nor suspicious osseous abnormalities. IMPRESSION: 1. No significant change in the appearance of the chest. 2. Cardiomegaly is stable with aortic atherosclerosis. 3. Streaky parenchymal opacities  at the left lung base are felt to represent atelectasis. Pneumonia is believed less likely. Electronically Signed   By: Ashley Royalty M.D.   On: 09/11/2018 23:24    Pending Labs Unresulted Labs (From admission, onward)    Start     Ordered   09/12/18 3976  Basic metabolic panel  Tomorrow morning,   R     09/12/18 0209          Vitals/Pain Today's Vitals   09/12/18 0245 09/12/18 0300 09/12/18 0315 09/12/18 0330  BP: (!) 170/112 (!) 178/110 (!) 198/110 (!) 189/124  Pulse: 89 81 84 85  Resp: 16 (!) 29 (!) 23 18  Temp:      TempSrc:      SpO2: 95% (!) 89% 93% 94%  PainSc:        Isolation Precautions No active isolations  Medications Medications  acetaminophen (TYLENOL) tablet 650 mg (has no administration in time range)    Or  acetaminophen (TYLENOL) suppository 650 mg (has no administration in time range)  ondansetron (ZOFRAN) tablet 4 mg (has no administration in time range)    Or  ondansetron (ZOFRAN) injection 4 mg (has no administration in time range)  heparin injection 5,000 Units (has no administration in time range)  hydrALAZINE (APRESOLINE) injection 20 mg (has no administration in time range)  amiodarone (PACERONE) tablet 200 mg (has no administration in time range)  amLODipine (NORVASC) tablet 2.5 mg (has no administration in time range)  metoprolol succinate (TOPROL-XL) 24 hr tablet 50 mg (has no administration in time range)   sodium chloride flush (NS) 0.9 % injection 3 mL (3 mLs Intravenous Given 09/12/18 0201)  sodium zirconium cyclosilicate (LOKELMA) packet 10 g (10 g Oral Given 09/12/18 0201)    Mobility walks Low fall risk   Focused Assessments Renal Assessment Handoff:  Hemodialysis Schedule: Hemodialysis Schedule: Tuesday/Thursday/Saturday Last Hemodialysis date and time: Tuesday 09/04/18   Restricted appendage: left arm     R Recommendations: See Admitting Provider Note  Report given to:   Additional Notes: N/A

## 2018-09-12 NOTE — Progress Notes (Signed)
PROGRESS NOTE    Raymond [name]  GLO:[id number] DOB: [birth year] DOA: 09/11/2018 PCP: [name], FNP (Confirm with patient/family/NH records and if not entered, this HAS to be entered at [facility] point of entry. "No PCP" if truly none.)   Brief Narrative: (Start on day 1 of progress note - keep it brief and live) Raymond [name] is a 63 y.o. male with medical history significant of ESRD dialysis TTS, PAF off of anticoagulants, substance abuse, homeless.  Patient with extensive history of missed dialysis sessions in past.  Patient presents to ED with c/o CP and SOB.  He states he hasnt been to dialysis since last Tuesday (8 days ago).  Missed a couple treatments before last Tuesday as well.  Has been coughing up some phlem, denies any fevers.  Symptoms are typical of when he misses too many dialysis sessions and gets fluid overloaded.  Of note and somewhat out of the usual for this patient, he did have an admit last month for GI bleed ultimately believed to be due to AVMs.  Taken off of coumadin due to concern that risks outweighed the benefits until he got a PCP to follow    Assessment & Plan:   Principal Problem:   Hyperkalemia Active Problems:   HYPERTENSION, BENIGN SYSTEMIC   Anemia associated with stage 5 chronic renal failure (HCC)   End-stage renal disease on hemodialysis (HCC)   PAF (paroxysmal atrial fibrillation) (HCC)   Polysubstance (excluding opioids) dependence (HCC)   Noncompliance of patient with renal dialysis (HCC)   ##Hyperkalemia due to missed dialysis sessions - -underwent hemodialysis -Follow-up with BMP  Hypertension accelerated -Noncompliant with medications -Start back on amlodipine, metoprolol  COPD exacerbation -Keep the patient on Rocephin, Zithromax, duo nebs, Solu-Medrol  Chest pain -Initial set of cardiac enzymes, EKG are unremarkable -Continue to cycle cardiac enzymes x3  Paroxysmal atrial fibrillation -Continue with amiodarone, metoprolol -Not  on anticoagulation as patient is noncompliant with medications  End-stage renal disease on hemodialysis -Patient is noncompliant with the dialysis sessions -Emphasized the importance of being compliant  Homeless state -Case management consult  Anemia of chronic kidney disease -Get iron profile, B12, folate RBC, reticulocyte count -Transfuse for hemoglobin less than 7  Noncompliance with the treatment regimen -Counseling done  Continued tobacco use -Counseling done -Keep the patient on nicotine patch  Alcohol abuse -Keep the patient on thiamine -Closely follow-up for any signs of withdrawal  Cocaine abuse -Counseling done  ##Colonoscopy showed 2 adenomas - needs repeat colonoscopy in 5 years   DVT prophylaxis: (Lovenox/Heparin/SCD's/anticoagulated/None (if comfort care) Code Status: (Full/Partial - specify details) Family Communication: (Specify name, relationship & date discussed. NO "discussed with patient") Disposition Plan: (specify when and where you expect patient to be discharged). Include barriers to DC in this tab.   Consultants:   Nephrology  Procedures:   Antimicrobials: Rocephin 09/12/2018 Zithromax 09/12/2018  Subjective: Complaining of cough with productive sputum Objective: Vitals:   09/12/18 1415 09/12/18 1430 09/12/18 1454 09/12/18 1500  BP: 131/90 134/79 (!) 136/98 (!) 167/90  Pulse: 88 93 85 89  Resp:   13 16  Temp:   98.1 F (36.7 C) 98.5 F (36.9 C)  TempSrc:   Oral Oral  SpO2:   98% 100%  Weight:   72.8 kg   Height:        Intake/Output Summary (Last 24 hours) at 09/12/2018 1708 Last data filed at 09/12/2018 1454 Gross per 24 hour  Intake 360 ml  Output 3500 ml  Net -3140 ml   Filed Weights   09/12/18 0427 09/12/18 1045 09/12/18 1454  Weight: 76.2 kg 76.1 kg 72.8 kg    Examination:  Constitutional: NAD, calm, comfortable Eyes: PERRL, lids and conjunctivae normal ENMT: Mucous membranes are moist. Posterior pharynx clear of any  exudate or lesions.Normal dentition.  Neck: normal, supple, no masses, no thyromegaly Respiratory:  Bilateral wheezing  cardiovascular: Regular rate and rhythm, no murmurs / rubs / gallops. No extremity edema. 2+ pedal pulses. No carotid bruits.  Abdomen: no tenderness, no masses palpated. No hepatosplenomegaly. Bowel sounds positive.  Musculoskeletal: no clubbing / cyanosis. No joint deformity upper and lower extremities. Good ROM, no contractures. Normal muscle tone.  Skin: no rashes, lesions, ulcers. No induration Neurologic: CN 2-12 grossly intact. Sensation intact, DTR normal. Strength 5/5 in all 4.  Psychiatric: Normal judgment and insight. Alert and oriented x 3. Normal mood.  Data Reviewed: I have personally reviewed following labs and imaging studies  CBC: Recent Labs  Lab 09/11/18 2222 09/12/18 1100  WBC 4.3 4.1  HGB 8.4* 7.8*  HCT 27.9* 26.2*  MCV 106.5* 106.1*  PLT 122* 130*   Basic Metabolic Panel: Recent Labs  Lab 09/11/18 2222 09/12/18 0532  NA 137 139  K 6.2* 6.0*  CL 101 102  CO2 21* 20*  GLUCOSE 73 91  BUN 103* 106*  CREATININE 19.88* 20.41*  CALCIUM 7.3* 7.5*   GFR: Estimated Creatinine Clearance: 3.5 mL/min (A) (by C-G formula based on SCr of 20.41 mg/dL (H)). Liver Function Tests: No results for input(s): AST, ALT, ALKPHOS, BILITOT, PROT, ALBUMIN in the last 168 hours. No results for input(s): LIPASE, AMYLASE in the last 168 hours. No results for input(s): AMMONIA in the last 168 hours. Coagulation Profile: No results for input(s): INR, PROTIME in the last 168 hours. Cardiac Enzymes: No results for input(s): CKTOTAL, CKMB, CKMBINDEX, TROPONINI in the last 168 hours. BNP (last 3 results) No results for input(s): PROBNP in the last 8760 hours. HbA1C: No results for input(s): HGBA1C in the last 72 hours. CBG: No results for input(s): GLUCAP in the last 168 hours. Lipid Profile: No results for input(s): CHOL, HDL, LDLCALC, TRIG, CHOLHDL,  LDLDIRECT in the last 72 hours. Thyroid Function Tests: No results for input(s): TSH, T4TOTAL, FREET4, T3FREE, THYROIDAB in the last 72 hours. Anemia Panel: No results for input(s): VITAMINB12, FOLATE, FERRITIN, TIBC, IRON, RETICCTPCT in the last 72 hours. Sepsis Labs: No results for input(s): PROCALCITON, LATICACIDVEN in the last 168 hours.  Recent Results (from the past 240 hour(s))  MRSA PCR Screening     Status: None   Collection Time: 09/12/18  5:00 AM  Result Value Ref Range Status   MRSA by PCR NEGATIVE NEGATIVE Final    Comment:        The GeneXpert MRSA Assay (FDA approved for NASAL specimens only), is one component of a comprehensive MRSA colonization surveillance program. It is not intended to diagnose MRSA infection nor to guide or monitor treatment for MRSA infections. Performed at [facility], [address], [place], [place] [address]          Radiology Studies: Dg Chest 2 View  Result Date: 09/11/2018 CLINICAL DATA:  Pain across the upper chest after dialysis. EXAM: CHEST - 2 VIEW COMPARISON:  08/27/2018 FINDINGS: Stable cardiomegaly with left basilar atelectasis unchanged in appearance. Aortic atherosclerosis is noted. No overt pulmonary edema, effusion or pneumothorax. No acute nor suspicious osseous abnormalities. IMPRESSION: 1. No significant change in the appearance  of the chest. 2. Cardiomegaly is stable with aortic atherosclerosis. 3. Streaky parenchymal opacities at the left lung base are felt to represent atelectasis. Pneumonia is believed less likely. Electronically Signed   By: Tollie Eth M.D.   On: 09/11/2018 23:24        Scheduled Meds: . amiodarone  200 mg Oral Daily  . amLODipine  2.5 mg Oral Daily  . calcitRIOL  1.5 mcg Oral Once per day on Mon Wed Fri  . Chlorhexidine Gluconate Cloth  6 each Topical Q0600  . [START ON 09/13/2018] darbepoetin (ARANESP) injection - DIALYSIS  150 mcg Intravenous Q Thu-HD  . heparin  5,000 Units  Subcutaneous Q8H  . metoprolol succinate  50 mg Oral Daily  . sevelamer carbonate  3,200 mg Oral TID WC   Continuous Infusions: . sodium chloride    . sodium chloride       LOS: 0 days    Time spent: 35 minutes    Jonluke Cobbins, MD [facility] Pager [phone]  If 7PM-7AM, please contact night-coverage [email or web address] Password [email or web address] 09/12/2018, 5:08 PM

## 2018-09-12 NOTE — Consult Note (Addendum)
[facility] Renal Consultation Note    Indication for Consultation:  Management of ESRD/hemodialysis, anemia, hypertension/volume, and secondary hyperparathyroidism. PCP:  HPI: Raymond [name] is a 63 y.o. male with a history of ESRD, COPD, CAD, hep C, HTN and substance abuse who presented to the ED yesterday with chest pain and SOB. Patient had not been to dialysis since 09/06/2018. He reports he has been trying to get to a treatment facility for substance abuse which has resulted in social/transportation issues. He does report using cocaine yesterday. On presentation to the ED, patient reported productive cough. BP was elevated at 188/108, HR 88, T 97.4. Labs significant for troponin 0.03, K 6.2 (6.0 this AM), BUN 103, Cr 19.88, WBC 4.3, Hgb 8.4, Plt 122. EKG revealed sinus rhythm with first degree AV block. CXR showed cardiomegaly and streaky parenchymal opacities at left lung base felt to be atelectasis, no over edema or effusion. Nephrology was consulted and orders were placed for dialysis today. He was given a dose of lokelma and hypertension was treated with toprolol, amlodipine and PRN hydralazine.   On exam, patient reports he feels as though he has a cold. Reports occasional productive cough. Denies SOB, dyspnea or wheezing. Reports he had some chest pain yesterday but it is now resolved. Denies palpitations, headache, weakness, vision changes, abdominal pain, N/V/D. His primary concern is getting treatment for substance abuse issues after discharge. Denies any recent issues with dialysis access. Does miss or shorten HD treatments intermittently.   Past Medical History:  Diagnosis Date  . Anemia   . CAD (coronary artery disease)    a. 03/2018: cath showing 20 to 30% stenosis along the LAD, luminal irregularities along the RCA, and angiographically normal LCx  . COPD (chronic obstructive pulmonary disease) ([place])   . Dyspnea    "with too much fluid"  . ESRD (end stage renal  disease) on dialysis [facility])    "MWF; [facility] Rinks" (07/22/17)  . Hemodialysis patient ([place])   . Hepatitis C    Still positive s/p liver biopsy at [facility]  and interferon therapy for 6 months. Most recent lab work was on 10/24/12  . Hepatitis C    "took the tx; gone now" (12/05/2016)  Was treated  . History of blood transfusion ~ 2012/2013   "related to my kidneys; blood was low"  . Hypertension   . Substance abuse ([place])   . Thyroid disease    Past Surgical History:  Procedure Laterality Date  . ABDOMINAL AORTOGRAM W/LOWER EXTREMITY N/A 02/08/2018   Procedure: ABDOMINAL AORTOGRAM W/LOWER EXTREMITY;  Surgeon: Conrad Zearing, MD;  Location: [place] CV LAB;  Service: Cardiovascular;  Laterality: N/A;  . AV FISTULA PLACEMENT Left Aug. 2013 ?  . COLONOSCOPY WITH PROPOFOL N/A 08/21/2018   Procedure: COLONOSCOPY WITH PROPOFOL;  Surgeon: Yetta Flock, MD;  Location: [place];  Service: Gastroenterology;  Laterality: N/A;  . ESOPHAGOGASTRODUODENOSCOPY (EGD) WITH PROPOFOL N/A 08/20/2018   Procedure: ESOPHAGOGASTRODUODENOSCOPY (EGD) WITH PROPOFOL;  Surgeon: Gatha Mayer, MD;  Location: [place];  Service: Endoscopy;  Laterality: N/A;  . FEMORAL-POPLITEAL BYPASS GRAFT Left 04/11/2018   Procedure: BYPASS GRAFT FEMORAL-POPLITEAL ARTERY LEFT LEG;  Surgeon: Rosetta Posner, MD;  Location: [place];  Service: Vascular;  Laterality: Left;  . HOT HEMOSTASIS N/A 08/20/2018   Procedure: HOT HEMOSTASIS (ARGON PLASMA COAGULATION/BICAP);  Surgeon: Gatha Mayer, MD;  Location: [facility] ENDOSCOPY;  Service: Endoscopy;  Laterality: N/A;  . LEFT HEART CATH AND CORONARY ANGIOGRAPHY N/A 03/29/2018  Procedure: LEFT HEART CATH AND CORONARY ANGIOGRAPHY;  Surgeon: Nelva Bush, MD;  Location: [place] CV [facility];  Service: Cardiovascular;  Laterality: N/A;  . LIVER BIOPSY    . ORIF ULNAR FRACTURE Left 05/18/2017   Procedure: OPEN REDUCTION INTERNAL FIXATION (ORIF) ULNAR FRACTURE;  Surgeon: Iran Planas, MD;   Location: [place];  Service: Orthopedics;  Laterality: Left;  . POLYPECTOMY  08/21/2018   Procedure: POLYPECTOMY;  Surgeon: Yetta Flock, MD;  Location: [facility] ENDOSCOPY;  Service: Gastroenterology;;  . SHUNTOGRAM N/A 12/11/2012   Procedure: Earney Mallet;  Surgeon: Serafina Mitchell, MD;  Location: [facility] CATH LAB;  Service: Cardiovascular;  Laterality: N/A;   Family History  Problem Relation Age of Onset  . Diabetes Father   . Hypertension Father   . Heart disease Father    Social History:  reports that he has been smoking cigarettes. He has a 25.00 pack-year smoking history. He has never used smokeless tobacco. He reports current alcohol use. He reports current drug use. Drug: Cocaine.  ROS: As per HPI otherwise negative.   Physical Exam: Vitals:   09/12/18 0300 09/12/18 0315 09/12/18 0330 09/12/18 0427  BP: (!) 178/110 (!) 198/110 (!) 189/124 (!) 179/106  Pulse: 81 84 85 80  Resp: (!) 29 (!) 23 18 20   Temp:    98.4 F (36.9 C)  TempSrc:    Oral  SpO2: (!) 89% 93% 94% 96%  Weight:    76.2 kg  Height:    5\' 8"  (1.727 m)     General: Well developed, well nourished, in no acute distress. Head: Normocephalic, atraumatic, sclera non-icteric, mucus membranes are moist. Neck: Supple without lymphadenopathy/masses. JVD elevated. Lungs: Clear bilaterally to auscultation without wheezes, rales, or rhonchi. Breathing is unlabored. Heart: RRR with normal S1, S2. No murmurs, rubs, or gallops appreciated. Abdomen: Soft, non-tender, non-distended with normoactive bowel sounds. No rebound/guarding. No obvious abdominal masses. Musculoskeletal:  Strength and tone appear normal for age. Lower extremities: Trace edema b/l lower extremities Neuro: Alert and oriented X 3. Moves all extremities spontaneously. Psych:  Responds to questions appropriately with a normal affect. Dialysis Access: LUE AVF  No Known Allergies Prior to Admission medications   Medication Sig Start Date End Date Taking?  Authorizing Provider  acetaminophen (TYLENOL) 325 MG tablet Take 650 mg by mouth every 6 (six) hours as needed for mild pain.   Yes [provider]  amiodarone (PACERONE) 200 MG tablet Take 1 tablet (200 mg total) by mouth daily. 08/13/18  Yes Cherene Altes, MD  amLODipine (NORVASC) 2.5 MG tablet Take 2.5 mg by mouth daily.   Yes [provider]  metoprolol succinate (TOPROL-XL) 50 MG 24 hr tablet Take 50 mg by mouth daily. Take with or immediately following a meal.   Yes [provider]   Current Facility-Administered Medications  Medication Dose Route Frequency Provider Last Rate Last Dose  . 0.9 %  sodium chloride infusion  100 mL Intravenous PRN Madelon Lips, MD      . 0.9 %  sodium chloride infusion  100 mL Intravenous PRN Madelon Lips, MD      . acetaminophen (TYLENOL) tablet 650 mg  650 mg Oral Q6H PRN Etta Quill, DO       Or  . acetaminophen (TYLENOL) suppository 650 mg  650 mg Rectal Q6H PRN Etta Quill, DO      . alteplase (CATHFLO ACTIVASE) injection 2 mg  2 mg Intracatheter Once PRN Madelon Lips, MD      .  amiodarone (PACERONE) tablet 200 mg  200 mg Oral Daily Alcario Drought, Jared M, DO      . amLODipine (NORVASC) tablet 2.5 mg  2.5 mg Oral Daily Jennette Kettle M, DO      . Chlorhexidine Gluconate Cloth 2 % PADS 6 each  6 each Topical Q0600 Madelon Lips, MD      . heparin injection 1,000 Units  1,000 Units Dialysis PRN Madelon Lips, MD      . heparin injection 5,000 Units  5,000 Units Subcutaneous Q8H Alcario Drought, Jared M, DO      . hydrALAZINE (APRESOLINE) injection 20 mg  20 mg Intravenous Q4H PRN Etta Quill, DO      . lidocaine (PF) (XYLOCAINE) 1 % injection 5 mL  5 mL Intradermal PRN Madelon Lips, MD      . lidocaine-prilocaine (EMLA) cream 1 application  1 application Topical PRN Madelon Lips, MD      . metoprolol succinate (TOPROL-XL) 24 hr tablet 50 mg  50 mg Oral Daily Alcario Drought, Jared M, DO      . ondansetron  [facility]) tablet 4 mg  4 mg Oral Q6H PRN Etta Quill, DO       Or  . ondansetron [facility]) injection 4 mg  4 mg Intravenous Q6H PRN Etta Quill, DO      . pentafluoroprop-tetrafluoroeth (GEBAUERS) aerosol 1 application  1 application Topical PRN Madelon Lips, MD       Facility-Administered Medications Ordered in Other Encounters  Medication Dose Route Frequency Provider Last Rate Last Dose  . midazolam (VERSED) 5 MG/5ML injection    Anesthesia Intra-op Sammie Bench, CRNA   2 mg at 04/11/18 1042   Labs: Basic Metabolic Panel: Recent Labs  Lab 09/11/18 2222 09/12/18 0532  NA 137 139  K 6.2* 6.0*  CL 101 102  CO2 21* 20*  GLUCOSE 73 91  BUN 103* 106*  CREATININE 19.88* 20.41*  CALCIUM 7.3* 7.5*   CBC: Recent Labs  Lab 09/11/18 2222  WBC 4.3  HGB 8.4*  HCT 27.9*  MCV 106.5*  PLT 122*   Cardiac Enzymes: No results for input(s): CKTOTAL, CKMB, CKMBINDEX, TROPONINI in the last 168 hours. CBG: No results for input(s): GLUCAP in the last 168 hours. Iron Studies: No results for input(s): IRON, TIBC, TRANSFERRIN, FERRITIN in the last 72 hours. Studies/Results: Dg Chest 2 View  Result Date: 09/11/2018 CLINICAL DATA:  Pain across the upper chest after dialysis. EXAM: CHEST - 2 VIEW COMPARISON:  08/27/2018 FINDINGS: Stable cardiomegaly with left basilar atelectasis unchanged in appearance. Aortic atherosclerosis is noted. No overt pulmonary edema, effusion or pneumothorax. No acute nor suspicious osseous abnormalities. IMPRESSION: 1. No significant change in the appearance of the chest. 2. Cardiomegaly is stable with aortic atherosclerosis. 3. Streaky parenchymal opacities at the left lung base are felt to represent atelectasis. Pneumonia is believed less likely. Electronically Signed   By: Ashley Royalty M.D.   On: 09/11/2018 23:24    Dialysis Orders: Center: [facility]  on TTS. EDW 74kg; 2.0K/2.5 Ca; BFR 450, DFR 800 180NRe Optiflux Access: LUE AVF No  heparin Mircera 150 mcg IV q2 weeks Calcitriol 1.106mcg 3x week during dialysis Parsabiv 15 mg IV 3xweek post HD Renvela 800 mcg 6 tabs PO TID with meals   Recent outpatient labs: 2/27: Hgb 8.2. 2/18: Tsat 59.0, Plt 144, K 4.3, Albumin 3.3, Ca 7.5, Phos 3.4, PTH 1379  Assessment/Plan: 1.  Hyperkalemia/CP/HTN: Presented with K 6.2. Likely secondary to multiple missed dialysis  treatments. Given dose of lokelma. Plan for HD today. Chest pain resolved at present. Continue home BP meds and PRN hydralazine per primary.  2.  ESRD:  See above. Multiple missed dialysis treatments (last HD 2/27 but shortened). K 6.0 this AM. Plan for HD today with 2K bath. May need additional extra treatments. Compliance has been an issue as an outpatient with frequently missed or shortened treatments. Uremia/ azotemia due to missed HD x 3-4.  Plan HD today and tomorrow.  Poor social situation.  3.  Hypertension/volume: BP remains elevated, some volume excess on exam. Currently 2.2kg above EDW per weights here. Plan for HD today with UF 3-3.5L.  4.  Anemia: Hgb 8.4, improved slightly from recent outpatient labs. Denies any overt bleeding. Last dose of mircera give on 08/30/2018, due for next dose of ESA tomorrow. Last Tsat 02.7.  5.  Metabolic bone disease: Ca 7.5, corrected estimated to be 8.1 based on last outpatient albumin. Last phos improved to 3.4, continue renvela at 4 tabs/meal (lower dose due to alteration in diet while in hospital)- follow Phos. Outpatient PTH significantly elevated, will continue calcitriol. Parsabiv unavailable in hospital, follow Ca and resume as outpatient.  6.  Nutrition: Albumin pending. Renal diet with fluid restriction.   7. Substance abuse: Reports he is planning to go to treatment for substance abuse after discharge  Anice Paganini, PA-C 09/12/2018, 9:21 AM  [facility] Pager: [phone]  Pt seen, examined and agree w assess/plan as above with additions as indicated.   [facility] 09/12/2018, 4:14 PM

## 2018-09-12 NOTE — Plan of Care (Signed)
Care plans reviewed and patient is progressing.  

## 2018-09-12 NOTE — Procedures (Signed)
   I was present at this dialysis session, have reviewed the session itself and made  appropriate changes Kelly Splinter MD [place] pager [phone]   09/12/2018, 4:15 PM

## 2018-09-13 DIAGNOSIS — F1920 Other psychoactive substance dependence, uncomplicated: Secondary | ICD-10-CM

## 2018-09-13 LAB — RETICULOCYTES
Immature Retic Fract: 22.1 % — ABNORMAL HIGH (ref 2.3–15.9)
RBC.: 2.54 MIL/uL — ABNORMAL LOW (ref 4.22–5.81)
Retic Count, Absolute: 89.9 10*3/uL (ref 19.0–186.0)
Retic Ct Pct: 3.5 % — ABNORMAL HIGH (ref 0.4–3.1)

## 2018-09-13 LAB — BASIC METABOLIC PANEL
Anion gap: 14 (ref 5–15)
BUN: 34 mg/dL — ABNORMAL HIGH (ref 8–23)
CO2: 25 mmol/L (ref 22–32)
Calcium: 7.4 mg/dL — ABNORMAL LOW (ref 8.9–10.3)
Chloride: 96 mmol/L — ABNORMAL LOW (ref 98–111)
Creatinine, Ser: 10.08 mg/dL — ABNORMAL HIGH (ref 0.61–1.24)
GFR calc Af Amer: 6 mL/min — ABNORMAL LOW (ref >60)
GFR calc non Af Amer: 5 mL/min — ABNORMAL LOW (ref >60)
Glucose, Bld: 77 mg/dL (ref 70–99)
Potassium: 4.3 mmol/L (ref 3.5–5.1)
SODIUM: 135 mmol/L (ref 135–145)

## 2018-09-13 LAB — CBC
HCT: 26.1 % — ABNORMAL LOW (ref 39.0–52.0)
Hemoglobin: 8.1 g/dL — ABNORMAL LOW (ref 13.0–17.0)
MCH: 31.9 pg (ref 26.0–34.0)
MCHC: 31 g/dL (ref 30.0–36.0)
MCV: 102.8 fL — ABNORMAL HIGH (ref 80.0–100.0)
Platelets: 132 10*3/uL — ABNORMAL LOW (ref 150–400)
RBC: 2.54 MIL/uL — ABNORMAL LOW (ref 4.22–5.81)
RDW: 20.8 % — ABNORMAL HIGH (ref 11.5–15.5)
WBC: 3.2 10*3/uL — AB (ref 4.0–10.5)
nRBC: 0 % (ref 0.0–0.2)

## 2018-09-13 LAB — IRON AND TIBC
IRON: 78 ug/dL (ref 45–182)
Saturation Ratios: 30 % (ref 17.9–39.5)
TIBC: 263 ug/dL (ref 250–450)
UIBC: 185 ug/dL

## 2018-09-13 LAB — VITAMIN B12: Vitamin B-12: 644 pg/mL (ref 180–914)

## 2018-09-13 MED ORDER — DARBEPOETIN ALFA 150 MCG/0.3ML IJ SOSY
PREFILLED_SYRINGE | INTRAMUSCULAR | Status: AC
  Filled 2018-09-13: qty 0.3

## 2018-09-13 MED ORDER — METHYLPREDNISOLONE SODIUM SUCC 40 MG IJ SOLR
40.0000 mg | Freq: Four times a day (QID) | INTRAMUSCULAR | Status: DC
  Administered 2018-09-13 – 2018-09-14 (×5): 40 mg via INTRAVENOUS
  Filled 2018-09-13 (×6): qty 1

## 2018-09-13 NOTE — Progress Notes (Signed)
PROGRESS NOTE    Raymond [name]  ZOX:[id number] DOB: 1956-06-02 DOA: 09/11/2018 PCP: [name], FNP (Confirm with patient/family/NH records and if not entered, this HAS to be entered at [facility] point of entry. "No PCP" if truly none.)   Brief Narrative: (Start on day 1 of progress note - keep it brief and live) Raymond [name] is a 63 y.o. male with medical history significant of ESRD dialysis TTS, PAF off of anticoagulants, substance abuse, homeless.  Patient with extensive history of missed dialysis sessions in past.  Patient presents to ED with c/o CP and SOB.  He states he hasnt been to dialysis since last Tuesday (8 days ago).  Missed a couple treatments before last Tuesday as well.  Has been coughing up some phlem, denies any fevers.  Symptoms are typical of when he misses too many dialysis sessions and gets fluid overloaded.  Of note and somewhat out of the usual for this patient, he did have an admit last month for GI bleed ultimately believed to be due to AVMs.  Taken off of coumadin due to concern that risks outweighed the benefits until he got a PCP to follow    Assessment & Plan:   Principal Problem:   Hyperkalemia Active Problems:   HYPERTENSION, BENIGN SYSTEMIC   Anemia associated with stage 5 chronic renal failure (HCC)   End-stage renal disease on hemodialysis (HCC)   PAF (paroxysmal atrial fibrillation) (HCC)   Polysubstance (excluding opioids) dependence (HCC)   Noncompliance of patient with renal dialysis (HCC)   ##Hyperkalemia due to missed dialysis sessions- -underwent hemodialysis -Normalized  ##Hypertension accelerated -Noncompliant with medications -Start back on amlodipine, metoprolol  ##COPD exacerbation -Keep the patient on Rocephin, Zithromax, duo nebs, Solu-Medrol  ##Chest pain -Initial set of cardiac enzymes, EKG are unremarkable -Continue to cycle cardiac enzymes x3  ##Paroxysmal atrial fibrillation -Continue with amiodarone, metoprolol -Not  on anticoagulation as patient is noncompliant with medications  ##End-stage renal disease on hemodialysis -Patient is noncompliant with the dialysis sessions -Emphasized the importance of being compliant  ##Homeless state -Case management consult  ##Anemia of chronic kidney disease -Get iron profile normal iron, low iron binding capacity, B12, folate RBC, reticulocyte count and normal -Transfuse for hemoglobin less than 7 -Patient will need Epogen with dialysis q. weekly  ##Noncompliance with the treatment regimen -Counseling done  ##Continued tobacco use -Counseling done -Keep the patient on nicotine patch  ##Alcohol abuse -Keep the patient on thiamine -Closely follow-up for any signs of withdrawal  ##Cocaine abuse -Counseling done -Patient states planning to undergo rehab drug abuse  ##Colonoscopy showed 2 adenomas - needs repeat colonoscopy in 5 years   DVT prophylaxis: Heparin Code Status: Full  family Communication: (Patient  disposition Plan: Home.   Consultants:   Nephrology  Procedures:   Antimicrobials: Rocephin 09/12/2018 Zithromax 09/12/2018  Subjective: Complaining of cough with productive sputum Objective: Vitals:   09/13/18 1400 09/13/18 1430 09/13/18 1500 09/13/18 1514  BP: (!) 174/100 (!) 170/98 (!) 156/100 (!) 152/98  Pulse: 76 78 79 81  Resp: 16 16 17 18   Temp:    98.4 F (36.9 C)  TempSrc:    Oral  SpO2:    96%  Weight:    70.5 kg  Height:        Intake/Output Summary (Last 24 hours) at 09/13/2018 1617 Last data filed at 09/13/2018 1611 Gross per 24 hour  Intake 220 ml  Output 2500 ml  Net -2280 ml   [employer]  09/12/18 1454 09/13/18 1200 09/13/18 1514  Weight: 72.8 kg 73 kg 70.5 kg    Examination:  Constitutional: NAD, calm, comfortable Eyes: PERRL, lids and conjunctivae normal ENMT: Mucous membranes are moist. Posterior pharynx clear of any exudate or lesions.Normal dentition.  Neck: normal, supple, no masses, no  thyromegaly Respiratory:  Improved bilateral wheezing occasional wheezing cardiovascular: Regular rate and rhythm, no murmurs / rubs / gallops. No extremity edema. 2+ pedal pulses. No carotid bruits.  Abdomen: no tenderness, no masses palpated. No hepatosplenomegaly. Bowel sounds positive.  Musculoskeletal: no clubbing / cyanosis. No joint deformity upper and lower extremities. Good ROM, no contractures. Normal muscle tone.  Skin: no rashes, lesions, ulcers. No induration Neurologic: CN 2-12 grossly intact. Sensation intact, DTR normal. Strength 5/5 in all 4.  Psychiatric: Normal judgment and insight. Alert and oriented x 3. Normal mood.  Data Reviewed: I have personally reviewed following labs and imaging studies  CBC: Recent Labs  Lab 09/11/18 2222 09/12/18 1100 09/13/18 0355  WBC 4.3 4.1 3.2*  HGB 8.4* 7.8* 8.1*  HCT 27.9* 26.2* 26.1*  MCV 106.5* 106.1* 102.8*  PLT 122* 130* 132*   Basic Metabolic Panel: Recent Labs  Lab 09/11/18 2222 09/12/18 0532 09/13/18 0355  NA 137 139 135  K 6.2* 6.0* 4.3  CL 101 102 96*  CO2 21* 20* 25  GLUCOSE 73 91 77  BUN 103* 106* 34*  CREATININE 19.88* 20.41* 10.08*  CALCIUM 7.3* 7.5* 7.4*   GFR: Estimated Creatinine Clearance: 7.2 mL/min (A) (by C-G formula based on SCr of 10.08 mg/dL (H)). Liver Function Tests: No results for input(s): AST, ALT, ALKPHOS, BILITOT, PROT, ALBUMIN in the last 168 hours. No results for input(s): LIPASE, AMYLASE in the last 168 hours. No results for input(s): AMMONIA in the last 168 hours. Coagulation Profile: No results for input(s): INR, PROTIME in the last 168 hours. Cardiac Enzymes: No results for input(s): CKTOTAL, CKMB, CKMBINDEX, TROPONINI in the last 168 hours. BNP (last 3 results) No results for input(s): PROBNP in the last 8760 hours. HbA1C: No results for input(s): HGBA1C in the last 72 hours. CBG: No results for input(s): GLUCAP in the last 168 hours. Lipid Profile: No results for  input(s): CHOL, HDL, LDLCALC, TRIG, CHOLHDL, LDLDIRECT in the last 72 hours. Thyroid Function Tests: No results for input(s): TSH, T4TOTAL, FREET4, T3FREE, THYROIDAB in the last 72 hours. Anemia Panel: Recent Labs    09/13/18 0355  VITAMINB12 644  TIBC 263  IRON 78  RETICCTPCT 3.5*   Sepsis Labs: No results for input(s): PROCALCITON, LATICACIDVEN in the last 168 hours.  Recent Results (from the past 240 hour(s))  MRSA PCR Screening     Status: None   Collection Time: 09/12/18  5:00 AM  Result Value Ref Range Status   MRSA by PCR NEGATIVE NEGATIVE Final    Comment:        The GeneXpert MRSA Assay (FDA approved for NASAL specimens only), is one component of a comprehensive MRSA colonization surveillance program. It is not intended to diagnose MRSA infection nor to guide or monitor treatment for MRSA infections. Performed at [facility], [address], [place], [place] [address]          Radiology Studies: Dg Chest 2 View  Result Date: 09/11/2018 CLINICAL DATA:  Pain across the upper chest after dialysis. EXAM: CHEST - 2 VIEW COMPARISON:  08/27/2018 FINDINGS: Stable cardiomegaly with left basilar atelectasis unchanged in appearance. Aortic atherosclerosis is noted. No overt pulmonary edema, effusion  or pneumothorax. No acute nor suspicious osseous abnormalities. IMPRESSION: 1. No significant change in the appearance of the chest. 2. Cardiomegaly is stable with aortic atherosclerosis. 3. Streaky parenchymal opacities at the left lung base are felt to represent atelectasis. Pneumonia is believed less likely. Electronically Signed   By: Tollie Eth M.D.   On: 09/11/2018 23:24        Scheduled Meds: . amiodarone  200 mg Oral Daily  . amLODipine  2.5 mg Oral Daily  . calcitRIOL  1.5 mcg Oral Once per day on Mon Wed Fri  . Chlorhexidine Gluconate Cloth  6 each Topical Q0600  . darbepoetin (ARANESP) injection - DIALYSIS  150 mcg Intravenous Q Thu-HD  . doxycycline   100 mg Oral Q12H  . heparin  5,000 Units Subcutaneous Q8H  . methylPREDNISolone (SOLU-MEDROL) injection  40 mg Intravenous Q6H  . metoprolol succinate  50 mg Oral Daily  . sevelamer carbonate  3,200 mg Oral TID WC   Continuous Infusions: . sodium chloride    . sodium chloride    . cefTRIAXone (ROCEPHIN)  IV 1 g (09/12/18 1836)     LOS: 1 day    Time spent: 35 minutes    Raymond Biggs, MD [facility] Pager [phone]  If 7PM-7AM, please contact night-coverage [email or web address] Password [facility] 09/13/2018, 4:17 PM

## 2018-09-13 NOTE — Progress Notes (Signed)
Called dialysis department to see when pt was scheduled to get treatment done. I was informed that pt was scheduled for this evening/tonight. Informed staff that pt's BP was elevated and asked about giving am BP meds since dialysis would not be until this evening. Was informed that it was okay to give BP meds.

## 2018-09-13 NOTE — Progress Notes (Signed)
[facility] Progress Note  Subjective: no new c/o. K+ 4.3 today  Vitals:   09/12/18 1500 09/12/18 1958 09/13/18 0559 09/13/18 0824  BP: (!) 167/90 (!) 158/96 (!) 163/102 (!) 166/103  Pulse: 89 93 85 83  Resp: 16 18 16 16   Temp: 98.5 F (36.9 C) 98.9 F (37.2 C) 98.3 F (36.8 C)   TempSrc: Oral Oral Oral   SpO2: 100% 99% 97% 98%  Weight:      Height:        Inpatient medications: . amiodarone  200 mg Oral Daily  . amLODipine  2.5 mg Oral Daily  . calcitRIOL  1.5 mcg Oral Once per day on Mon Wed Fri  . Chlorhexidine Gluconate Cloth  6 each Topical Q0600  . darbepoetin (ARANESP) injection - DIALYSIS  150 mcg Intravenous Q Thu-HD  . doxycycline  100 mg Oral Q12H  . heparin  5,000 Units Subcutaneous Q8H  . methylPREDNISolone (SOLU-MEDROL) injection  40 mg Intravenous Q6H  . metoprolol succinate  50 mg Oral Daily  . sevelamer carbonate  3,200 mg Oral TID WC   . sodium chloride    . sodium chloride    . cefTRIAXone (ROCEPHIN)  IV 1 g (09/12/18 1836)   sodium chloride, sodium chloride, acetaminophen **OR** acetaminophen, alteplase, heparin, hydrALAZINE, lidocaine (PF), lidocaine-prilocaine, ondansetron **OR** ondansetron (ZOFRAN) IV, pentafluoroprop-tetrafluoroeth  Iron/TIBC/Ferritin/ %Sat    Component Value Date/Time   IRON 78 09/13/2018 0355   TIBC 263 09/13/2018 0355   FERRITIN 423 (H) 04/20/2018 0924   IRONPCTSAT 30 09/13/2018 0355    Exam: General: Well developed, well nourished, in no acute distress. Head: Normocephalic, atraumatic, sclera non-icteric. Neck: Supple without lymphadenopathy/masses. JVD elevated. Lungs: Clear bilaterally to auscultation without wheezes, rales, or rhonchi. Heart: RRR with normal S1, S2. No murmurs, rubs, or gallops appreciated. Abdomen: Soft, non-tender, non-distended with normoactive bowel sounds. Lower extremities:  no edema b/l lower extremities Neuro: Alert and oriented X 3. Moves all extremities  spontaneously. Dialysis Access: LUE AVF   [facility] TTS   ?4h   74kg   2/2.5 bath  450/800  LUE AVF  Hep none Mircera 150 mcg IV q2 weeks Calcitriol 1.7mcg 3x week during dialysis Parsabiv 15 mg IV 3xweek post HD Renvela 800 mcg 6 tabs PO TID with meals   Recent outpatient labs: 2/27: Hgb 8.2. 2/18: Tsat 59.0, Plt 144, K 4.3, Albumin 3.3, Ca 7.5, Phos 3.4, PTH 1379  Assessment/Plan: 1.  Hyperkalemia/CP/HTN: Presented with K 6.2. Likely secondary to multiple missed dialysis treatments. Given dose of lokelma. Plan for HD today. Chest pain resolved at present. Continue home BP meds and PRN hydralazine per primary.  2.  ESRD:  TTS HD. Multiple missed dialysis treatments (last HD 2/27 but shortened). HD today on schedule. Compliance has been an issue as an outpatient 3.  Hypertension/volume: under dry wt today, UF as tolerated. Cont meds 4.  Anemia: Hgb 8.4, improved slightly from recent outpatient labs. Denies any overt bleeding. Last dose of mircera give on 08/30/2018, due for next dose of ESA today. Last Tsat 76.7.   5.  Metabolic bone disease: Ca 7.5, corrected estimated to be 8.1 based on last outpatient albumin. Last phos improved to 3.4, continue renvela at 4 tabs/meal (lower dose due to alteration in diet while in hospital)- follow Phos. Outpatient PTH significantly elevated, will continue calcitriol. Parsabiv unavailable in hospital, follow Ca and resume as outpatient.  6.  Nutrition: Albumin pending. Renal diet with fluid restriction.   7. Substance abuse:  Reports he is planning to go to treatment for substance abuse after discharge    [facility] 09/13/2018, 11:08 AM  Recent Labs  Lab 09/12/18 0532 09/13/18 0355  NA 139 135  K 6.0* 4.3  CL 102 96*  CO2 20* 25  GLUCOSE 91 77  BUN 106* 34*  CREATININE 20.41* 10.08*  CALCIUM 7.5* 7.4*   No results for input(s): AST, ALT, ALKPHOS, BILITOT, PROT in the last 168 hours. Recent Labs  Lab 09/12/18 1100  09/13/18 0355  WBC 4.1 3.2*  HGB 7.8* 8.1*  HCT 26.2* 26.1*  MCV 106.1* 102.8*  PLT 130* 132*

## 2018-09-14 DIAGNOSIS — J441 Chronic obstructive pulmonary disease with (acute) exacerbation: Secondary | ICD-10-CM

## 2018-09-14 MED ORDER — CALCITRIOL 0.5 MCG PO CAPS: 1.5000 ug | ORAL_CAPSULE | ORAL | 0 refills | Status: DC

## 2018-09-14 MED ORDER — SEVELAMER CARBONATE 800 MG PO TABS: 3200.0000 mg | ORAL_TABLET | Freq: Three times a day (TID) | ORAL | 0 refills | Status: DC

## 2018-09-14 MED ORDER — AMLODIPINE BESYLATE 2.5 MG PO TABS: 2.5000 mg | ORAL_TABLET | Freq: Every day | ORAL | 0 refills | Status: DC

## 2018-09-14 MED ORDER — DOXYCYCLINE HYCLATE 50 MG PO CAPS: 100.0000 mg | ORAL_CAPSULE | Freq: Two times a day (BID) | ORAL | 0 refills | Status: DC

## 2018-09-14 MED ORDER — METOPROLOL SUCCINATE ER 50 MG PO TB24: 50.0000 mg | ORAL_TABLET | Freq: Every day | ORAL | 0 refills | Status: DC

## 2018-09-14 MED ORDER — PREDNISONE 10 MG PO TABS: 20.0000 mg | ORAL_TABLET | Freq: Two times a day (BID) | ORAL | 0 refills | Status: DC

## 2018-09-14 MED ORDER — AMIODARONE HCL 200 MG PO TABS: 200.0000 mg | ORAL_TABLET | Freq: Every day | ORAL | 0 refills | Status: DC

## 2018-09-14 NOTE — Progress Notes (Signed)
CSW visited with the patient at bedside. He was alert and oriented. Patient states he last has cocaine on Tuesday. He  missed Tuesday and Thursday  dialysis treatment. Patient states he was waiting on [facility] treatment program to come and pick him up form his brother's house and that is why he missed his dialysis treatments.  Patient was given substance abuse resources by CSW on 08/19/2018. CSW provided the patient with resources again and gave him bus pass.  Clinical Social Worker will sign off for now as social work intervention is no longer needed. Please consult [place] if new need arises.    Thurmond Butts, [place] Social Worker [phone]

## 2018-09-14 NOTE — Progress Notes (Signed)
Pt not in room to receive d/c instructions. Unable to locate. Pt had discharge orders and received a bus pass however, he ws going to eat his dinner. Nurse was printing d/c info. Notified security. Called home phone # listed on facesheet. Pt not there per brother. Pt left with PIV we think. Did not find it in the room nor did we d/c as we were preparing for his d/c.  Attempted another number of sister. No answer. --JM

## 2018-09-14 NOTE — Discharge Summary (Signed)
Physician Discharge Summary  Raymond [name] Raymond [name] DOB: 1955-11-21 DOA: 09/11/2018  PCP: [name], FNP  Admit date: 09/11/2018 Discharge date: 09/14/2018  Time spent: 40 minutes  Recommendations for Outpatient Follow-up:  1. Follow-up with nephrology 2. 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 Patient with extensive history of missed dialysis sessions in past. Patient presents to ED with c/o CP and SOB. He states he hasnt been to dialysis since last Tuesday (8 days ago). Missed a couple treatments before last Tuesday as well. Has been coughing up some phlem, denies any fevers. Symptoms are typical of when he misses too many dialysis sessions and gets fluid overloaded.  Of note and somewhat out of the usual for this patient, he did have an admit last month for GI bleed ultimately believed to be due to AVMs. Taken off of coumadin due to concern that risks outweighed the benefits until he got a PCP to follow   Hospital Course:  ##Hyperkalemia due to missed dialysis sessions- -underwent hemodialysis -Normalized  ##Hypertension accelerated -Noncompliant with medications -Start back on amlodipine, metoprolol  ##COPD exacerbation -Keep the patient on Rocephin, Zithromax, duo  nebs, Solu-Medrol  ##Chest pain -Initial set of cardiac enzymes, EKG are unremarkable -Continue to cycle cardiac enzymes x3  ##Paroxysmal atrial fibrillation -Continue with amiodarone, metoprolol -Not on anticoagulation as patient is noncompliant with medications  ##End-stage renal disease on hemodialysis -Patient is noncompliant with the dialysis sessions -Emphasized the importance of being compliant  ##Homeless state -Case management consult  ##Anemia of chronic kidney disease -Get iron profile normal iron, low iron binding capacity, B12, folate RBC, reticulocyte count and normal -Transfuse for hemoglobin less than 7 -Patient will need Epogen with dialysis q. weekly  ##Noncompliance with the treatment regimen -Counseling done extensively, discussed with the patient regarding complications including death -Patient expressed understanding  ##Continued tobacco use -Counseling done -Keep the patient on nicotine patch  ##Alcohol abuse -Keep the patient on thiamine -Closely follow-up for any signs of withdrawal  ##Cocaine abuse -Counseling done -Patient states planning to undergo rehab drug abuse  ##Colonoscopy showed 2 adenomas - needs repeat colonoscopy in 5 years  Procedures:  Hemodialysis  Consultations: Nephrology Discharge Exam: Vitals:   09/14/18 1300 09/14/18 1459  BP:  (!) 148/89  Pulse:  79  Resp: 15 18  Temp:  98.6 F (37 C)  SpO2:  98%    Constitutional:NAD, calm, comfortable Eyes:PERRL, lids and conjunctivae normal ENMT:Mucous membranes are moist. Posterior pharynx clear of any exudate or lesions.Normal dentition.  Neck:normal, supple, no masses, no thyromegaly Respiratory:  Bilateral clear to auscultation  cardiovascular:Regular rate and rhythm, no murmurs / rubs / gallops. No extremity edema. 2+ pedal pulses. No carotid bruits.  Abdomen:no tenderness, no masses palpated. No hepatosplenomegaly. Bowel sounds positive.   Musculoskeletal:no clubbing / cyanosis. No joint deformity upper and lower  extremities. Good ROM, no contractures. Normal muscle tone.  Skin:no rashes, lesions, ulcers. No induration Neurologic:CN 2-12 grossly intact. Sensation intact, DTR normal. Strength 5/5 in all 4.  Psychiatric:Normal judgment and insight. Alert and oriented x 3. Normal mood. Discharge Instructions   Discharge Instructions    Diet - low sodium heart healthy   Complete by:  As directed    Increase activity slowly   Complete by:  As directed      Allergies as of 09/14/2018   No Known Allergies     Medication List    STOP taking these medications   acetaminophen 325 MG tablet Commonly known as:  TYLENOL     TAKE these medications   amiodarone 200 MG tablet Commonly known as:  PACERONE Take 1 tablet (200 mg total) by mouth daily.   amLODipine 2.5 MG tablet Commonly known as:  NORVASC Take 1 tablet (2.5 mg total) by mouth daily.   calcitRIOL 0.5 MCG capsule Commonly known as:  ROCALTROL Take 3 capsules (1.5 mcg total) by mouth 3 (three) times a week. Start taking on:  September 17, 2018   doxycycline 50 MG capsule Commonly known as:  VIBRAMYCIN Take 2 capsules (100 mg total) by mouth 2 (two) times daily.   metoprolol succinate 50 MG 24 hr tablet Commonly known as:  TOPROL-XL Take 1 tablet (50 mg total) by mouth daily for 30 days. Take with or immediately following a meal.   predniSONE 10 MG tablet Commonly known as:  DELTASONE Take 2 tablets (20 mg total) by mouth 2 (two) times daily with a meal.   sevelamer carbonate 800 MG tablet Commonly known as:  RENVELA Take 4 tablets (3,200 mg total) by mouth 3 (three) times daily with meals.      No Known Allergies    The results of significant diagnostics from this hospitalization (including imaging, microbiology, ancillary and laboratory) are listed below for reference.    Significant Diagnostic Studies: Dg Chest 2 View  Result Date:  09/11/2018 CLINICAL DATA:  Pain across the upper chest after dialysis. EXAM: CHEST - 2 VIEW COMPARISON:  08/27/2018 FINDINGS: Stable cardiomegaly with left basilar atelectasis unchanged in appearance. Aortic atherosclerosis is noted. No overt pulmonary edema, effusion or pneumothorax. No acute nor suspicious osseous abnormalities. IMPRESSION: 1. No significant change in the appearance of the chest. 2. Cardiomegaly is stable with aortic atherosclerosis. 3. Streaky parenchymal opacities at the left lung base are felt to represent atelectasis. Pneumonia is believed less likely. Electronically Signed   By: Ashley Royalty M.D.   On: 09/11/2018 23:24   Dg Chest 2 View  Result Date: 08/27/2018 CLINICAL DATA:  Missed dialysis treatment 2 days ago with chest pain, shortness of breath and dry cough. Smoker. EXAM: CHEST - 2 VIEW COMPARISON:  08/18/2018 FINDINGS: Lungs are adequately inflated demonstrate airspace opacification over the lingula which may be due to atelectasis or infection. No effusion. Mild stable cardiomegaly. Remainder of the exam is unchanged. IMPRESSION: Opacification over the lingula which may be due to atelectasis or infection. Stable cardiomegaly. Electronically Signed   By: Marin Olp M.D.   On: 08/27/2018 11:26   Dg Chest 2 View  Result Date: 08/18/2018 CLINICAL DATA:  Pt reports he last went to dialysis was 08-11-2018. Pt reports he does not go because he is homeless and he is not going through all that. Pt has bilateral lower extremity swelling. EXAM: CHEST - 2 VIEW COMPARISON:  08/15/2018 FINDINGS: The heart is enlarged. Lungs are free of focal  consolidations and pleural effusions. No pulmonary edema. IMPRESSION: Cardiomegaly. Electronically Signed   By: Nolon Nations M.D.   On: 08/18/2018 15:45   Vas [place] Burnard Bunting With/wo Tbi  Result Date: 09/05/2018 LOWER EXTREMITY DOPPLER STUDY Indications: Peripheral artery disease, and Follow up bypass graft. High Risk Factors: Hypertension, hyperlipidemia,  current smoker, coronary artery                    disease.  Vascular Interventions: 04/11/2018: left fem-pop bypass graft. Performing Technologist: Burley Saver RVT  Examination Guidelines: A complete evaluation includes at minimum, Doppler waveform signals and systolic blood pressure reading at the level of bilateral brachial, anterior tibial, and posterior tibial arteries, when vessel segments are accessible. Bilateral testing is considered an integral part of a complete examination. Photoelectric Plethysmograph (PPG) waveforms and toe systolic pressure readings are included as required and additional duplex testing as needed. Limited examinations for reoccurring indications may be performed as noted.  ABI Findings: +---------+------------------+-----+----------+--------+ Right    Rt Pressure (mmHg)IndexWaveform  Comment  +---------+------------------+-----+----------+--------+ Brachial 192                                       +---------+------------------+-----+----------+--------+ PTA      255               1.33 monophasic         +---------+------------------+-----+----------+--------+ DP       124               0.65 monophasic         +---------+------------------+-----+----------+--------+ Great Toe57                0.30 Abnormal           +---------+------------------+-----+----------+--------+ +---------+------------------+-----+---------+-------+ Left     Lt Pressure (mmHg)IndexWaveform Comment +---------+------------------+-----+---------+-------+ Brachial                                 AVF     +---------+------------------+-----+---------+-------+ PTA      226               1.18 biphasic         +---------+------------------+-----+---------+-------+ DP       202               1.05 triphasic        +---------+------------------+-----+---------+-------+ Great Toe163               0.85 Normal            +---------+------------------+-----+---------+-------+ +-------+-----------+-----------+------------+------------+ ABI/TBIToday's ABIToday's TBIPrevious ABIPrevious TBI +-------+-----------+-----------+------------+------------+ Right  1.33       0.30       0.75                     +-------+-----------+-----------+------------+------------+ Left   1.18       0.85       1.14                     +-------+-----------+-----------+------------+------------+ Previous study performed at [facility] on 04/13/2018.  Summary: Right: Resting right ankle-brachial index indicates noncompressible right lower extremity arteries.The right toe-brachial index is abnormal. RT great toe pressure = 57 mmHg. PPG tracings appear dampened. Left: Resting left ankle-brachial index is within normal range. No evidence of significant left lower extremity arterial disease. The  left toe-brachial index is normal. LT Great toe pressure = 163 mmHg.  *See table(s) above for measurements and observations.  Electronically signed by Deitra Mayo MD on 09/05/2018 at 3:35:42 PM.    Final     Microbiology: Recent Results (from the past 240 hour(s))  MRSA PCR Screening     Status: None   Collection Time: 09/12/18  5:00 AM  Result Value Ref Range Status   MRSA by PCR NEGATIVE NEGATIVE Final    Comment:        The GeneXpert MRSA Assay (FDA approved for NASAL specimens only), is one component of a comprehensive MRSA colonization surveillance program. It is not intended to diagnose MRSA infection nor to guide or monitor treatment for MRSA infections. Performed at [facility], [place] [address] [place], [place] [address]      Labs: Basic Metabolic Panel: Recent Labs  Lab 09/11/18 2222 09/12/18 0532 09/13/18 0355  NA 137 139 135  K 6.2* 6.0* 4.3  CL 101 102 96*  CO2 21* 20* 25  GLUCOSE 73 91 77  BUN 103* 106* 34*  CREATININE 19.88* 20.41* 10.08*  CALCIUM 7.3* 7.5* 7.4*   Liver Function  Tests: No results for input(s): AST, ALT, ALKPHOS, BILITOT, PROT, ALBUMIN in the last 168 hours. No results for input(s): LIPASE, AMYLASE in the last 168 hours. No results for input(s): AMMONIA in the last 168 hours. CBC: Recent Labs  Lab 09/11/18 2222 09/12/18 1100 09/13/18 0355  WBC 4.3 4.1 3.2*  HGB 8.4* 7.8* 8.1*  HCT 27.9* 26.2* 26.1*  MCV 106.5* 106.1* 102.8*  PLT 122* 130* 132*   Cardiac Enzymes: No results for input(s): CKTOTAL, CKMB, CKMBINDEX, TROPONINI in the last 168 hours. BNP: BNP (last 3 results) No results for input(s): BNP in the last 8760 hours.  ProBNP (last 3 results) No results for input(s): PROBNP in the last 8760 hours.  CBG: No results for input(s): GLUCAP in the last 168 hours.     SignedMonica Becton MD.  [facility] 09/14/2018, 8:18 PM

## 2018-09-14 NOTE — Progress Notes (Signed)
Pt was informed about his discharge order. Social worker had been to pt's room to provide bus pass, per pt's request. I prepared pt's discharge instructions and walked to pt's room, only to find that he was not in there. The pt's IV was not located in the room. It is unclear if he left with it still in place. Security was notified. Unit director called pt's number on file. Pt's brother answered and said he did not know of pt's location.

## 2018-09-14 NOTE — Care Management Note (Signed)
Case Management Note Marvetta Gibbons RN, BSN Transitions of Care Unit 4E- RN Case Manager [phone]  Patient Details  Name: Raymond [name] MRN: [id number] Date of Birth: 04-21-1956  Subjective/Objective:   Pt admitted with hyperkalemia secondary to noncompliance with HD                 Action/Plan: PTA pt from home, hx polysubstance abuse, CSW consulted for resource assistance. [facility] consult placed for noncompliance- per Atika with [facility], The pt is not eligible for community based [facility] program.   Expected Discharge Date:  09/14/18               Expected Discharge Plan:  Home/Self Care  In-House Referral:  Clinical Social Work, [facility]  Discharge planning Services  CM Consult  Post Acute Care Choice:    Choice offered to:     DME Arranged:    DME Agency:     HH Arranged:    [place] Agency:     Status of Service:  Completed, signed off  If discussed at [employer] of Stay Meetings, dates discussed:    Discharge Disposition: home/self care   Additional Comments:  Dawayne Patricia, RN 09/14/2018, 3:09 PM

## 2018-09-14 NOTE — Care Management Important Message (Signed)
Important Message  Patient Details  Name: Raymond [name] MRN: [id number] Date of Birth: 1956/07/10   Medicare Important Message Given:  Yes    Jammi Morrissette P Haynes 09/14/2018, 11:16 AM

## 2018-09-14 NOTE — Progress Notes (Signed)
[facility] Progress Note  Subjective: no new c/o.   Vitals:   09/14/18 0500 09/14/18 0700 09/14/18 0900 09/14/18 1018  BP:    (!) 160/95  Pulse:    71  Resp: 12 19 14 15   Temp:    98.2 F (36.8 C)  TempSrc:    Oral  SpO2:      Weight:      Height:        Inpatient medications: . amiodarone  200 mg Oral Daily  . amLODipine  2.5 mg Oral Daily  . calcitRIOL  1.5 mcg Oral Once per day on Mon Wed Fri  . Chlorhexidine Gluconate Cloth  6 each Topical Q0600  . darbepoetin (ARANESP) injection - DIALYSIS  150 mcg Intravenous Q Thu-HD  . doxycycline  100 mg Oral Q12H  . heparin  5,000 Units Subcutaneous Q8H  . methylPREDNISolone (SOLU-MEDROL) injection  40 mg Intravenous Q6H  . metoprolol succinate  50 mg Oral Daily  . sevelamer carbonate  3,200 mg Oral TID WC   . sodium chloride    . sodium chloride    . cefTRIAXone (ROCEPHIN)  IV 1 g (09/13/18 1645)   sodium chloride, sodium chloride, acetaminophen **OR** acetaminophen, alteplase, heparin, hydrALAZINE, lidocaine (PF), lidocaine-prilocaine, ondansetron **OR** ondansetron (ZOFRAN) IV, pentafluoroprop-tetrafluoroeth  Iron/TIBC/Ferritin/ %Sat    Component Value Date/Time   IRON 78 09/13/2018 0355   TIBC 263 09/13/2018 0355   FERRITIN 423 (H) 04/20/2018 0924   IRONPCTSAT 30 09/13/2018 0355    Exam: General: Well developed, well nourished, in no acute distress. Head: Normocephalic, atraumatic, sclera non-icteric. Neck: Supple without lymphadenopathy/masses. JVD elevated. Lungs: Clear bilaterally to auscultation without wheezes, rales, or rhonchi. Heart: RRR with normal S1, S2. No murmurs, rubs, or gallops appreciated. Abdomen: Soft, non-tender, non-distended with normoactive bowel sounds. Lower extremities:  no edema b/l lower extremities Neuro: Alert and oriented X 3. Moves all extremities spontaneously. Dialysis Access: LUE AVF   [facility] TTS   ?4h   74kg   2/2.5 bath  450/800  LUE AVF  Hep none Mircera 150  mcg IV q2 weeks Calcitriol 1.39mcg 3x week during dialysis Parsabiv 15 mg IV 3xweek post HD Renvela 800 mcg 6 tabs PO TID with meals   Recent outpatient labs: 2/27: Hgb 8.2. 2/18: Tsat 59.0, Plt 144, K 4.3, Albumin 3.3, Ca 7.5, Phos 3.4, PTH 1379  Assessment/Plan: 1.  SOB/ hyperkalemia: missed multiple HD treatments. SP HD x 2 here and back to baseline from esrd standpoint.  He has a poor social situation and is bouncing back to hospital frequently but other than SNF placement (for homeless +ESRD combination) we don't have any other suggestions.  2.  ESRD:  TTS HD. OK for dc from renal standpoint.  HD Sat if still here.  3.  Hypertension/volume: under dry wt today, UF as tolerated. Cont meds 4.  Anemia: Hgb 8.4, improved slightly from recent outpatient labs. Denies any overt bleeding. Last dose of mircera give on 08/30/2018, got darbe 150 ug here on 3/5.  5.  Metabolic bone disease: Ca 7.5, corrected estimated to be 8.1 based on last outpatient albumin. Last phos improved to 3.4, continue renvela at 4 tabs/meal (lower dose due to alteration in diet while in hospital)- follow Phos. Outpatient PTH significantly elevated, will continue calcitriol. Parsabiv unavailable in hospital, follow Ca and resume as outpatient.  6.  Nutrition: Albumin pending. Renal diet with fluid restriction.   7. Substance abuse: Reports he is planning to go to treatment for substance  abuse after discharge    [facility] 09/14/2018, 11:21 AM  Recent Labs  Lab 09/12/18 0532 09/13/18 0355  NA 139 135  K 6.0* 4.3  CL 102 96*  CO2 20* 25  GLUCOSE 91 77  BUN 106* 34*  CREATININE 20.41* 10.08*  CALCIUM 7.5* 7.4*   No results for input(s): AST, ALT, ALKPHOS, BILITOT, PROT in the last 168 hours. Recent Labs  Lab 09/12/18 1100 09/13/18 0355  WBC 4.1 3.2*  HGB 7.8* 8.1*  HCT 26.2* 26.1*  MCV 106.1* 102.8*  PLT 130* 132*

## 2018-09-14 NOTE — Consult Note (Signed)
   [facility] CM Inpatient Consult   09/14/2018  Raymond [name] Aug 16, 1955 [id number]    Received referral from inpatient RNCM for [place] Management services. However, after checking Medicare's All Payers List patient does not appear to be eligible. [place] Management office verified that patient not eligible for [facility] Care Management at this time.  Made inpatient RNCM aware of above notes and that this patient is not eligible for [facility] Care Management Services.  Reason:  Not a beneficiary currently attributed to one of the [place].  Membership roster used to verify non- eligible status.  Marthenia Rolling, MSN-Ed, RN,BSN [facility] Liaison [phone]. Also noted patient has a [employer] eligible banner.

## 2018-09-20 DIAGNOSIS — D509 Iron deficiency anemia, unspecified: Secondary | ICD-10-CM | POA: Diagnosis not present

## 2018-09-20 DIAGNOSIS — N2581 Secondary hyperparathyroidism of renal origin: Secondary | ICD-10-CM | POA: Diagnosis not present

## 2018-09-20 DIAGNOSIS — D631 Anemia in chronic kidney disease: Secondary | ICD-10-CM | POA: Diagnosis not present

## 2018-09-20 DIAGNOSIS — N186 End stage renal disease: Secondary | ICD-10-CM | POA: Diagnosis not present

## 2018-09-24 DIAGNOSIS — E1129 Type 2 diabetes mellitus with other diabetic kidney complication: Secondary | ICD-10-CM

## 2018-09-24 HISTORY — DX: Type 2 diabetes mellitus with other diabetic kidney complication: E11.29

## 2018-09-25 DIAGNOSIS — D631 Anemia in chronic kidney disease: Secondary | ICD-10-CM | POA: Diagnosis not present

## 2018-09-25 DIAGNOSIS — D509 Iron deficiency anemia, unspecified: Secondary | ICD-10-CM | POA: Diagnosis not present

## 2018-09-25 DIAGNOSIS — N186 End stage renal disease: Secondary | ICD-10-CM | POA: Diagnosis not present

## 2018-09-25 DIAGNOSIS — N2581 Secondary hyperparathyroidism of renal origin: Secondary | ICD-10-CM | POA: Diagnosis not present

## 2018-09-27 DIAGNOSIS — N2581 Secondary hyperparathyroidism of renal origin: Secondary | ICD-10-CM | POA: Diagnosis not present

## 2018-09-27 DIAGNOSIS — N186 End stage renal disease: Secondary | ICD-10-CM | POA: Diagnosis not present

## 2018-09-27 DIAGNOSIS — D509 Iron deficiency anemia, unspecified: Secondary | ICD-10-CM | POA: Diagnosis not present

## 2018-09-27 DIAGNOSIS — D631 Anemia in chronic kidney disease: Secondary | ICD-10-CM | POA: Diagnosis not present

## 2018-09-29 DIAGNOSIS — N2581 Secondary hyperparathyroidism of renal origin: Secondary | ICD-10-CM | POA: Diagnosis not present

## 2018-09-29 DIAGNOSIS — D631 Anemia in chronic kidney disease: Secondary | ICD-10-CM | POA: Diagnosis not present

## 2018-09-29 DIAGNOSIS — D509 Iron deficiency anemia, unspecified: Secondary | ICD-10-CM | POA: Diagnosis not present

## 2018-09-29 DIAGNOSIS — N186 End stage renal disease: Secondary | ICD-10-CM | POA: Diagnosis not present

## 2018-10-02 DIAGNOSIS — D631 Anemia in chronic kidney disease: Secondary | ICD-10-CM | POA: Diagnosis not present

## 2018-10-02 DIAGNOSIS — N186 End stage renal disease: Secondary | ICD-10-CM | POA: Diagnosis not present

## 2018-10-02 DIAGNOSIS — D509 Iron deficiency anemia, unspecified: Secondary | ICD-10-CM | POA: Diagnosis not present

## 2018-10-02 DIAGNOSIS — N2581 Secondary hyperparathyroidism of renal origin: Secondary | ICD-10-CM | POA: Diagnosis not present

## 2018-10-04 DIAGNOSIS — N186 End stage renal disease: Secondary | ICD-10-CM | POA: Diagnosis not present

## 2018-10-04 DIAGNOSIS — D509 Iron deficiency anemia, unspecified: Secondary | ICD-10-CM | POA: Diagnosis not present

## 2018-10-04 DIAGNOSIS — N2581 Secondary hyperparathyroidism of renal origin: Secondary | ICD-10-CM | POA: Diagnosis not present

## 2018-10-04 DIAGNOSIS — D631 Anemia in chronic kidney disease: Secondary | ICD-10-CM | POA: Diagnosis not present

## 2018-10-06 DIAGNOSIS — D509 Iron deficiency anemia, unspecified: Secondary | ICD-10-CM | POA: Diagnosis not present

## 2018-10-06 DIAGNOSIS — N2581 Secondary hyperparathyroidism of renal origin: Secondary | ICD-10-CM | POA: Diagnosis not present

## 2018-10-06 DIAGNOSIS — N186 End stage renal disease: Secondary | ICD-10-CM | POA: Diagnosis not present

## 2018-10-06 DIAGNOSIS — D631 Anemia in chronic kidney disease: Secondary | ICD-10-CM | POA: Diagnosis not present

## 2018-10-09 DIAGNOSIS — D509 Iron deficiency anemia, unspecified: Secondary | ICD-10-CM | POA: Diagnosis not present

## 2018-10-09 DIAGNOSIS — N2581 Secondary hyperparathyroidism of renal origin: Secondary | ICD-10-CM | POA: Diagnosis not present

## 2018-10-09 DIAGNOSIS — N186 End stage renal disease: Secondary | ICD-10-CM | POA: Diagnosis not present

## 2018-10-09 DIAGNOSIS — D631 Anemia in chronic kidney disease: Secondary | ICD-10-CM | POA: Diagnosis not present

## 2018-10-10 DIAGNOSIS — Z992 Dependence on renal dialysis: Secondary | ICD-10-CM | POA: Diagnosis not present

## 2018-10-10 DIAGNOSIS — N2889 Other specified disorders of kidney and ureter: Secondary | ICD-10-CM | POA: Diagnosis not present

## 2018-10-10 DIAGNOSIS — N186 End stage renal disease: Secondary | ICD-10-CM | POA: Diagnosis not present

## 2018-10-13 DIAGNOSIS — N2581 Secondary hyperparathyroidism of renal origin: Secondary | ICD-10-CM | POA: Diagnosis not present

## 2018-10-13 DIAGNOSIS — D509 Iron deficiency anemia, unspecified: Secondary | ICD-10-CM | POA: Diagnosis not present

## 2018-10-13 DIAGNOSIS — N186 End stage renal disease: Secondary | ICD-10-CM | POA: Diagnosis not present

## 2018-10-16 DIAGNOSIS — N2581 Secondary hyperparathyroidism of renal origin: Secondary | ICD-10-CM | POA: Diagnosis not present

## 2018-10-16 DIAGNOSIS — N186 End stage renal disease: Secondary | ICD-10-CM | POA: Diagnosis not present

## 2018-10-16 DIAGNOSIS — D509 Iron deficiency anemia, unspecified: Secondary | ICD-10-CM | POA: Diagnosis not present

## 2018-10-18 DIAGNOSIS — D509 Iron deficiency anemia, unspecified: Secondary | ICD-10-CM | POA: Diagnosis not present

## 2018-10-18 DIAGNOSIS — N186 End stage renal disease: Secondary | ICD-10-CM | POA: Diagnosis not present

## 2018-10-18 DIAGNOSIS — N2581 Secondary hyperparathyroidism of renal origin: Secondary | ICD-10-CM | POA: Diagnosis not present

## 2018-10-23 DIAGNOSIS — D509 Iron deficiency anemia, unspecified: Secondary | ICD-10-CM | POA: Diagnosis not present

## 2018-10-23 DIAGNOSIS — N186 End stage renal disease: Secondary | ICD-10-CM | POA: Diagnosis not present

## 2018-10-23 DIAGNOSIS — N2581 Secondary hyperparathyroidism of renal origin: Secondary | ICD-10-CM | POA: Diagnosis not present

## 2018-10-25 DIAGNOSIS — N2581 Secondary hyperparathyroidism of renal origin: Secondary | ICD-10-CM | POA: Diagnosis not present

## 2018-10-25 DIAGNOSIS — N186 End stage renal disease: Secondary | ICD-10-CM | POA: Diagnosis not present

## 2018-10-25 DIAGNOSIS — D509 Iron deficiency anemia, unspecified: Secondary | ICD-10-CM | POA: Diagnosis not present

## 2018-10-30 DIAGNOSIS — N2581 Secondary hyperparathyroidism of renal origin: Secondary | ICD-10-CM | POA: Diagnosis not present

## 2018-10-30 DIAGNOSIS — N186 End stage renal disease: Secondary | ICD-10-CM | POA: Diagnosis not present

## 2018-10-30 DIAGNOSIS — D509 Iron deficiency anemia, unspecified: Secondary | ICD-10-CM | POA: Diagnosis not present

## 2018-11-01 DIAGNOSIS — N2581 Secondary hyperparathyroidism of renal origin: Secondary | ICD-10-CM | POA: Diagnosis not present

## 2018-11-01 DIAGNOSIS — D509 Iron deficiency anemia, unspecified: Secondary | ICD-10-CM | POA: Diagnosis not present

## 2018-11-01 DIAGNOSIS — N186 End stage renal disease: Secondary | ICD-10-CM | POA: Diagnosis not present

## 2018-11-03 DIAGNOSIS — N2581 Secondary hyperparathyroidism of renal origin: Secondary | ICD-10-CM | POA: Diagnosis not present

## 2018-11-03 DIAGNOSIS — N186 End stage renal disease: Secondary | ICD-10-CM | POA: Diagnosis not present

## 2018-11-03 DIAGNOSIS — D509 Iron deficiency anemia, unspecified: Secondary | ICD-10-CM | POA: Diagnosis not present

## 2018-11-06 DIAGNOSIS — D509 Iron deficiency anemia, unspecified: Secondary | ICD-10-CM | POA: Diagnosis not present

## 2018-11-06 DIAGNOSIS — N2581 Secondary hyperparathyroidism of renal origin: Secondary | ICD-10-CM | POA: Diagnosis not present

## 2018-11-06 DIAGNOSIS — N186 End stage renal disease: Secondary | ICD-10-CM | POA: Diagnosis not present

## 2018-11-08 DIAGNOSIS — D509 Iron deficiency anemia, unspecified: Secondary | ICD-10-CM | POA: Diagnosis not present

## 2018-11-08 DIAGNOSIS — N186 End stage renal disease: Secondary | ICD-10-CM | POA: Diagnosis not present

## 2018-11-08 DIAGNOSIS — N2581 Secondary hyperparathyroidism of renal origin: Secondary | ICD-10-CM | POA: Diagnosis not present

## 2018-11-09 DIAGNOSIS — N186 End stage renal disease: Secondary | ICD-10-CM | POA: Diagnosis not present

## 2018-11-09 DIAGNOSIS — N2889 Other specified disorders of kidney and ureter: Secondary | ICD-10-CM | POA: Diagnosis not present

## 2018-11-09 DIAGNOSIS — Z992 Dependence on renal dialysis: Secondary | ICD-10-CM | POA: Diagnosis not present

## 2018-11-15 DIAGNOSIS — D509 Iron deficiency anemia, unspecified: Secondary | ICD-10-CM | POA: Diagnosis not present

## 2018-11-15 DIAGNOSIS — N186 End stage renal disease: Secondary | ICD-10-CM | POA: Diagnosis not present

## 2018-11-15 DIAGNOSIS — N2581 Secondary hyperparathyroidism of renal origin: Secondary | ICD-10-CM | POA: Diagnosis not present

## 2018-11-15 DIAGNOSIS — D631 Anemia in chronic kidney disease: Secondary | ICD-10-CM | POA: Diagnosis not present

## 2018-11-17 DIAGNOSIS — N2581 Secondary hyperparathyroidism of renal origin: Secondary | ICD-10-CM | POA: Diagnosis not present

## 2018-11-17 DIAGNOSIS — N186 End stage renal disease: Secondary | ICD-10-CM | POA: Diagnosis not present

## 2018-11-17 DIAGNOSIS — D631 Anemia in chronic kidney disease: Secondary | ICD-10-CM | POA: Diagnosis not present

## 2018-11-17 DIAGNOSIS — D509 Iron deficiency anemia, unspecified: Secondary | ICD-10-CM | POA: Diagnosis not present

## 2018-11-20 DIAGNOSIS — D631 Anemia in chronic kidney disease: Secondary | ICD-10-CM | POA: Diagnosis not present

## 2018-11-20 DIAGNOSIS — D509 Iron deficiency anemia, unspecified: Secondary | ICD-10-CM | POA: Diagnosis not present

## 2018-11-20 DIAGNOSIS — N186 End stage renal disease: Secondary | ICD-10-CM | POA: Diagnosis not present

## 2018-11-20 DIAGNOSIS — N2581 Secondary hyperparathyroidism of renal origin: Secondary | ICD-10-CM | POA: Diagnosis not present

## 2018-11-21 DIAGNOSIS — H40052 Ocular hypertension, left eye: Secondary | ICD-10-CM | POA: Diagnosis not present

## 2018-11-21 DIAGNOSIS — H25012 Cortical age-related cataract, left eye: Secondary | ICD-10-CM | POA: Diagnosis not present

## 2018-11-21 DIAGNOSIS — H52203 Unspecified astigmatism, bilateral: Secondary | ICD-10-CM | POA: Diagnosis not present

## 2018-11-22 DIAGNOSIS — N186 End stage renal disease: Secondary | ICD-10-CM | POA: Diagnosis not present

## 2018-11-22 DIAGNOSIS — N2581 Secondary hyperparathyroidism of renal origin: Secondary | ICD-10-CM | POA: Diagnosis not present

## 2018-11-22 DIAGNOSIS — D631 Anemia in chronic kidney disease: Secondary | ICD-10-CM | POA: Diagnosis not present

## 2018-11-22 DIAGNOSIS — D509 Iron deficiency anemia, unspecified: Secondary | ICD-10-CM | POA: Diagnosis not present

## 2018-11-24 DIAGNOSIS — N186 End stage renal disease: Secondary | ICD-10-CM | POA: Diagnosis not present

## 2018-11-24 DIAGNOSIS — D509 Iron deficiency anemia, unspecified: Secondary | ICD-10-CM | POA: Diagnosis not present

## 2018-11-24 DIAGNOSIS — N2581 Secondary hyperparathyroidism of renal origin: Secondary | ICD-10-CM | POA: Diagnosis not present

## 2018-11-24 DIAGNOSIS — D631 Anemia in chronic kidney disease: Secondary | ICD-10-CM | POA: Diagnosis not present

## 2018-11-27 DIAGNOSIS — D509 Iron deficiency anemia, unspecified: Secondary | ICD-10-CM | POA: Diagnosis not present

## 2018-11-27 DIAGNOSIS — N186 End stage renal disease: Secondary | ICD-10-CM | POA: Diagnosis not present

## 2018-11-27 DIAGNOSIS — D631 Anemia in chronic kidney disease: Secondary | ICD-10-CM | POA: Diagnosis not present

## 2018-11-27 DIAGNOSIS — N2581 Secondary hyperparathyroidism of renal origin: Secondary | ICD-10-CM | POA: Diagnosis not present

## 2018-12-01 DIAGNOSIS — N186 End stage renal disease: Secondary | ICD-10-CM | POA: Diagnosis not present

## 2018-12-01 DIAGNOSIS — N2581 Secondary hyperparathyroidism of renal origin: Secondary | ICD-10-CM | POA: Diagnosis not present

## 2018-12-01 DIAGNOSIS — D509 Iron deficiency anemia, unspecified: Secondary | ICD-10-CM | POA: Diagnosis not present

## 2018-12-01 DIAGNOSIS — D631 Anemia in chronic kidney disease: Secondary | ICD-10-CM | POA: Diagnosis not present

## 2018-12-06 DIAGNOSIS — D631 Anemia in chronic kidney disease: Secondary | ICD-10-CM | POA: Diagnosis not present

## 2018-12-06 DIAGNOSIS — D509 Iron deficiency anemia, unspecified: Secondary | ICD-10-CM | POA: Diagnosis not present

## 2018-12-06 DIAGNOSIS — N2581 Secondary hyperparathyroidism of renal origin: Secondary | ICD-10-CM | POA: Diagnosis not present

## 2018-12-06 DIAGNOSIS — N186 End stage renal disease: Secondary | ICD-10-CM | POA: Diagnosis not present

## 2018-12-08 DIAGNOSIS — D509 Iron deficiency anemia, unspecified: Secondary | ICD-10-CM | POA: Diagnosis not present

## 2018-12-08 DIAGNOSIS — N2581 Secondary hyperparathyroidism of renal origin: Secondary | ICD-10-CM | POA: Diagnosis not present

## 2018-12-08 DIAGNOSIS — N186 End stage renal disease: Secondary | ICD-10-CM | POA: Diagnosis not present

## 2018-12-08 DIAGNOSIS — D631 Anemia in chronic kidney disease: Secondary | ICD-10-CM | POA: Diagnosis not present

## 2018-12-10 DIAGNOSIS — N186 End stage renal disease: Secondary | ICD-10-CM | POA: Diagnosis not present

## 2018-12-10 DIAGNOSIS — Z992 Dependence on renal dialysis: Secondary | ICD-10-CM | POA: Diagnosis not present

## 2018-12-10 DIAGNOSIS — N2889 Other specified disorders of kidney and ureter: Secondary | ICD-10-CM | POA: Diagnosis not present

## 2018-12-15 DIAGNOSIS — D509 Iron deficiency anemia, unspecified: Secondary | ICD-10-CM | POA: Diagnosis not present

## 2018-12-15 DIAGNOSIS — D631 Anemia in chronic kidney disease: Secondary | ICD-10-CM | POA: Diagnosis not present

## 2018-12-15 DIAGNOSIS — N2581 Secondary hyperparathyroidism of renal origin: Secondary | ICD-10-CM | POA: Diagnosis not present

## 2018-12-15 DIAGNOSIS — N186 End stage renal disease: Secondary | ICD-10-CM | POA: Diagnosis not present

## 2018-12-20 DIAGNOSIS — D631 Anemia in chronic kidney disease: Secondary | ICD-10-CM | POA: Diagnosis not present

## 2018-12-20 DIAGNOSIS — D509 Iron deficiency anemia, unspecified: Secondary | ICD-10-CM | POA: Diagnosis not present

## 2018-12-20 DIAGNOSIS — N186 End stage renal disease: Secondary | ICD-10-CM | POA: Diagnosis not present

## 2018-12-20 DIAGNOSIS — N2581 Secondary hyperparathyroidism of renal origin: Secondary | ICD-10-CM | POA: Diagnosis not present

## 2018-12-22 DIAGNOSIS — D631 Anemia in chronic kidney disease: Secondary | ICD-10-CM | POA: Diagnosis not present

## 2018-12-22 DIAGNOSIS — N186 End stage renal disease: Secondary | ICD-10-CM | POA: Diagnosis not present

## 2018-12-22 DIAGNOSIS — N2581 Secondary hyperparathyroidism of renal origin: Secondary | ICD-10-CM | POA: Diagnosis not present

## 2018-12-22 DIAGNOSIS — D509 Iron deficiency anemia, unspecified: Secondary | ICD-10-CM | POA: Diagnosis not present

## 2018-12-24 ENCOUNTER — Other Ambulatory Visit: Payer: Self-pay

## 2018-12-24 ENCOUNTER — Emergency Department (HOSPITAL_COMMUNITY): Payer: Medicare Other

## 2018-12-24 ENCOUNTER — Encounter (HOSPITAL_COMMUNITY): Payer: Self-pay | Admitting: Emergency Medicine

## 2018-12-24 ENCOUNTER — Ambulatory Visit (HOSPITAL_COMMUNITY)
Admission: EM | Admit: 2018-12-24 | Discharge: 2018-12-24 | Disposition: A | Discharge status: Other Acute Inpatient Hospital | Payer: Medicare Other | Source: Home / Self Care

## 2018-12-24 ENCOUNTER — Inpatient Hospital Stay (HOSPITAL_COMMUNITY)
Admission: EM | Admit: 2018-12-24 | Discharge: 2018-12-27 | DRG: 391 | Disposition: A | Discharge status: Home / Self Care | Payer: Medicare Other | Attending: Family Medicine | Admitting: Family Medicine

## 2018-12-24 DIAGNOSIS — F1490 Cocaine use, unspecified, uncomplicated: Secondary | ICD-10-CM | POA: Diagnosis present

## 2018-12-24 DIAGNOSIS — Z992 Dependence on renal dialysis: Secondary | ICD-10-CM | POA: Diagnosis not present

## 2018-12-24 DIAGNOSIS — R112 Nausea with vomiting, unspecified: Secondary | ICD-10-CM | POA: Diagnosis not present

## 2018-12-24 DIAGNOSIS — I10 Essential (primary) hypertension: Secondary | ICD-10-CM | POA: Diagnosis present

## 2018-12-24 DIAGNOSIS — N2581 Secondary hyperparathyroidism of renal origin: Secondary | ICD-10-CM | POA: Diagnosis present

## 2018-12-24 DIAGNOSIS — Z833 Family history of diabetes mellitus: Secondary | ICD-10-CM

## 2018-12-24 DIAGNOSIS — J449 Chronic obstructive pulmonary disease, unspecified: Secondary | ICD-10-CM | POA: Diagnosis present

## 2018-12-24 DIAGNOSIS — I739 Peripheral vascular disease, unspecified: Secondary | ICD-10-CM | POA: Diagnosis present

## 2018-12-24 DIAGNOSIS — R197 Diarrhea, unspecified: Secondary | ICD-10-CM

## 2018-12-24 DIAGNOSIS — E8889 Other specified metabolic disorders: Secondary | ICD-10-CM | POA: Diagnosis present

## 2018-12-24 DIAGNOSIS — I440 Atrioventricular block, first degree: Secondary | ICD-10-CM | POA: Diagnosis present

## 2018-12-24 DIAGNOSIS — I120 Hypertensive chronic kidney disease with stage 5 chronic kidney disease or end stage renal disease: Secondary | ICD-10-CM | POA: Diagnosis not present

## 2018-12-24 DIAGNOSIS — D631 Anemia in chronic kidney disease: Secondary | ICD-10-CM | POA: Diagnosis present

## 2018-12-24 DIAGNOSIS — Z20828 Contact with and (suspected) exposure to other viral communicable diseases: Secondary | ICD-10-CM | POA: Diagnosis present

## 2018-12-24 DIAGNOSIS — F17210 Nicotine dependence, cigarettes, uncomplicated: Secondary | ICD-10-CM | POA: Diagnosis present

## 2018-12-24 DIAGNOSIS — B1920 Unspecified viral hepatitis C without hepatic coma: Secondary | ICD-10-CM | POA: Diagnosis present

## 2018-12-24 DIAGNOSIS — I2510 Atherosclerotic heart disease of native coronary artery without angina pectoris: Secondary | ICD-10-CM | POA: Diagnosis present

## 2018-12-24 DIAGNOSIS — R1110 Vomiting, unspecified: Secondary | ICD-10-CM | POA: Diagnosis not present

## 2018-12-24 DIAGNOSIS — I4892 Unspecified atrial flutter: Secondary | ICD-10-CM | POA: Diagnosis present

## 2018-12-24 DIAGNOSIS — Z79899 Other long term (current) drug therapy: Secondary | ICD-10-CM | POA: Diagnosis not present

## 2018-12-24 DIAGNOSIS — F1990 Other psychoactive substance use, unspecified, uncomplicated: Secondary | ICD-10-CM

## 2018-12-24 DIAGNOSIS — A09 Infectious gastroenteritis and colitis, unspecified: Secondary | ICD-10-CM | POA: Diagnosis present

## 2018-12-24 DIAGNOSIS — N186 End stage renal disease: Secondary | ICD-10-CM | POA: Diagnosis present

## 2018-12-24 DIAGNOSIS — I480 Paroxysmal atrial fibrillation: Secondary | ICD-10-CM | POA: Diagnosis present

## 2018-12-24 DIAGNOSIS — E162 Hypoglycemia, unspecified: Secondary | ICD-10-CM

## 2018-12-24 DIAGNOSIS — Z8249 Family history of ischemic heart disease and other diseases of the circulatory system: Secondary | ICD-10-CM | POA: Diagnosis not present

## 2018-12-24 DIAGNOSIS — E860 Dehydration: Secondary | ICD-10-CM | POA: Diagnosis present

## 2018-12-24 DIAGNOSIS — K529 Noninfective gastroenteritis and colitis, unspecified: Secondary | ICD-10-CM

## 2018-12-24 DIAGNOSIS — I959 Hypotension, unspecified: Secondary | ICD-10-CM

## 2018-12-24 LAB — CBC WITH DIFFERENTIAL/PLATELET
Abs Immature Granulocytes: 0.02 10*3/uL (ref 0.00–0.07)
Basophils Absolute: 0 10*3/uL (ref 0.0–0.1)
Basophils Relative: 0 %
Eosinophils Absolute: 0 10*3/uL (ref 0.0–0.5)
Eosinophils Relative: 0 %
HCT: 37.4 % — ABNORMAL LOW (ref 39.0–52.0)
Hemoglobin: 11.6 g/dL — ABNORMAL LOW (ref 13.0–17.0)
Immature Granulocytes: 0 %
Lymphocytes Relative: 13 %
Lymphs Abs: 0.6 10*3/uL — ABNORMAL LOW (ref 0.7–4.0)
MCH: 32.7 pg (ref 26.0–34.0)
MCHC: 31 g/dL (ref 30.0–36.0)
MCV: 105.4 fL — ABNORMAL HIGH (ref 80.0–100.0)
Monocytes Absolute: 0.7 10*3/uL (ref 0.1–1.0)
Monocytes Relative: 14 %
Neutro Abs: 3.3 10*3/uL (ref 1.7–7.7)
Neutrophils Relative %: 73 %
Platelets: 152 10*3/uL (ref 150–400)
RBC: 3.55 MIL/uL — ABNORMAL LOW (ref 4.22–5.81)
RDW: 15.4 % (ref 11.5–15.5)
WBC: 4.6 10*3/uL (ref 4.0–10.5)
nRBC: 0 % (ref 0.0–0.2)

## 2018-12-24 LAB — COMPREHENSIVE METABOLIC PANEL
ALT: 12 U/L (ref 0–44)
AST: 15 U/L (ref 15–41)
Albumin: 4 g/dL (ref 3.5–5.0)
Alkaline Phosphatase: 57 U/L (ref 38–126)
Anion gap: 18 — ABNORMAL HIGH (ref 5–15)
BUN: 56 mg/dL — ABNORMAL HIGH (ref 8–23)
CO2: 26 mmol/L (ref 22–32)
Calcium: 9 mg/dL (ref 8.9–10.3)
Chloride: 92 mmol/L — ABNORMAL LOW (ref 98–111)
Creatinine, Ser: 14.05 mg/dL — ABNORMAL HIGH (ref 0.61–1.24)
GFR calc Af Amer: 4 mL/min — ABNORMAL LOW (ref >60)
GFR calc non Af Amer: 3 mL/min — ABNORMAL LOW (ref >60)
Glucose, Bld: 95 mg/dL (ref 70–99)
Potassium: 4.8 mmol/L (ref 3.5–5.1)
Sodium: 136 mmol/L (ref 135–145)
Total Bilirubin: 0.5 mg/dL (ref 0.3–1.2)
Total Protein: 8.7 g/dL — ABNORMAL HIGH (ref 6.5–8.1)

## 2018-12-24 LAB — LIPASE, BLOOD: Lipase: 54 U/L — ABNORMAL HIGH (ref 11–51)

## 2018-12-24 LAB — CBG MONITORING, ED
Glucose-Capillary: 211 mg/dL — ABNORMAL HIGH (ref 70–99)
Glucose-Capillary: <10 mg/dL — CL (ref 70–99)
Glucose-Capillary: 113 mg/dL — ABNORMAL HIGH (ref 70–99)
Glucose-Capillary: 84 mg/dL (ref 70–99)
Glucose-Capillary: 66 mg/dL — ABNORMAL LOW (ref 70–99)
Glucose-Capillary: 82 mg/dL (ref 70–99)

## 2018-12-24 LAB — GLUCOSE, CAPILLARY: Glucose-Capillary: 90 mg/dL (ref 70–99)

## 2018-12-24 LAB — SARS CORONAVIRUS 2: SARS Coronavirus 2: NOT DETECTED

## 2018-12-24 IMAGING — CT CT ABDOMEN AND PELVIS WITH CONTRAST
2 of 6 series · 16 of 46 positions shown, 18 images · IV contrast (Omni 300)
Comparison: CT dated [DATE].

CLINICAL DATA: Nausea and vomiting. Dialysis patient. Slight
shortness of breath

EXAM:
CT ABDOMEN AND PELVIS WITH CONTRAST
TECHNIQUE: Multidetector CT imaging of the abdomen and pelvis was performed
using the standard protocol following bolus administration of
intravenous contrast.
CONTRAST:  100mL OMNIPAQUE IOHEXOL 300 MG/ML  SOLN

[Series 6: a/p w/ cor · coronal · 0.90mm/px · 3 of 144 slices shown]
[im 48/144  soft-tissue]
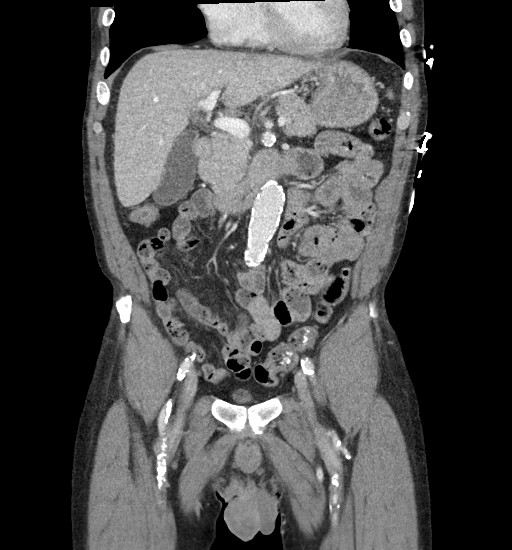
[im 64/144  soft-tissue]
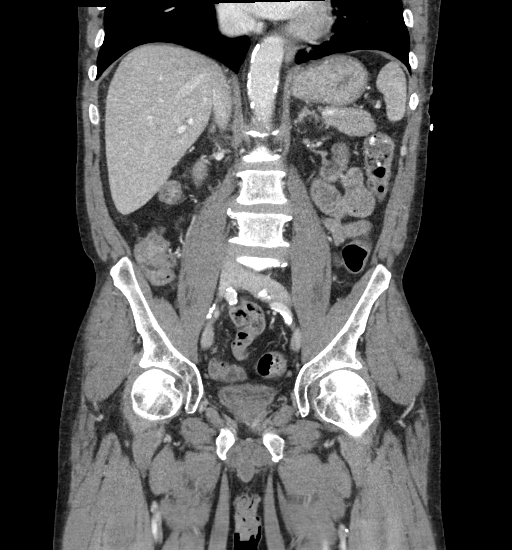
[im 80/144  soft-tissue]
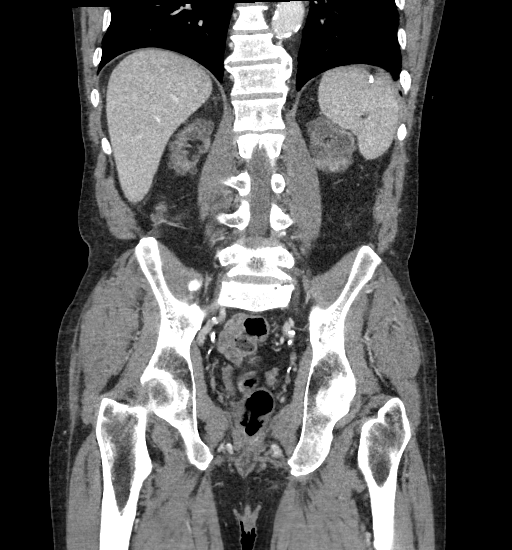

[Series 9: a/p w/ 5mm · axial · 0.82mm/px · z∈[+202,+622]mm · 13 of 99 slices shown, 15 images]
[im 8/99  soft-tissue]
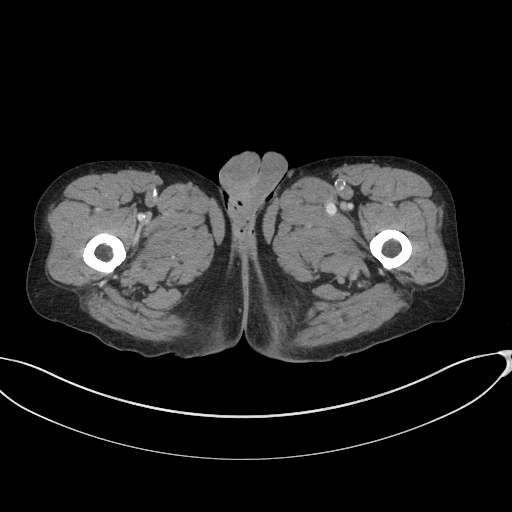
[im 8/99  bone]
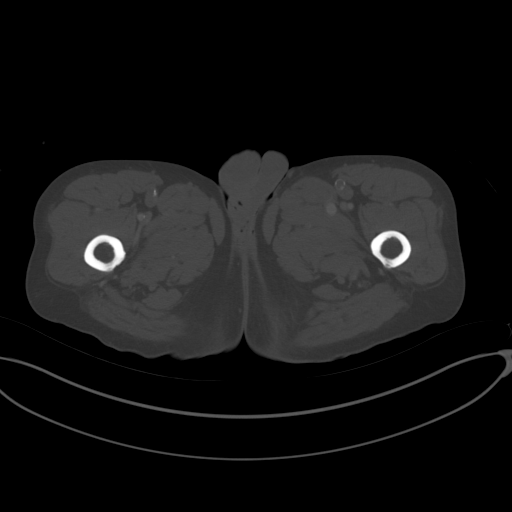
[im 15/99  soft-tissue]
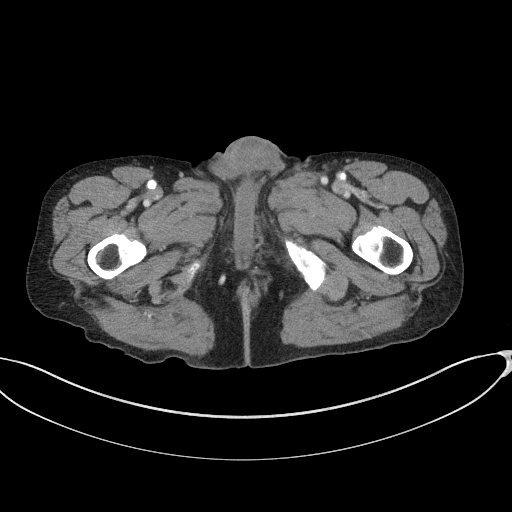
[im 22/99  soft-tissue]
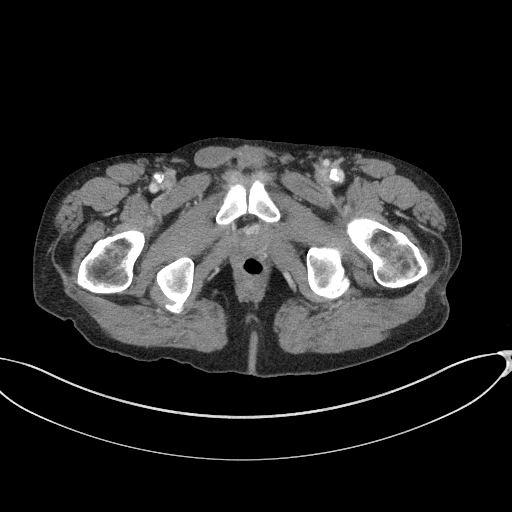
[im 29/99  soft-tissue]
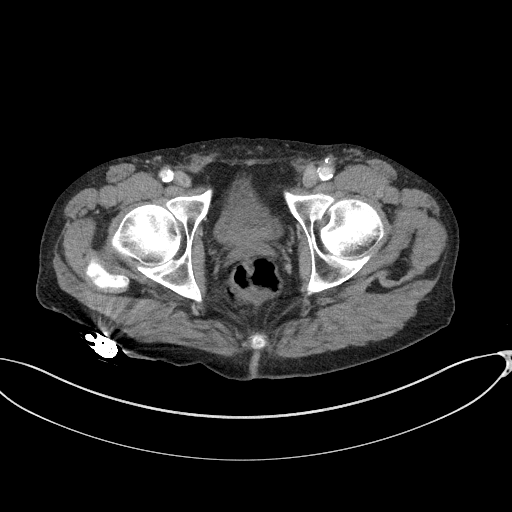
[im 36/99  soft-tissue]
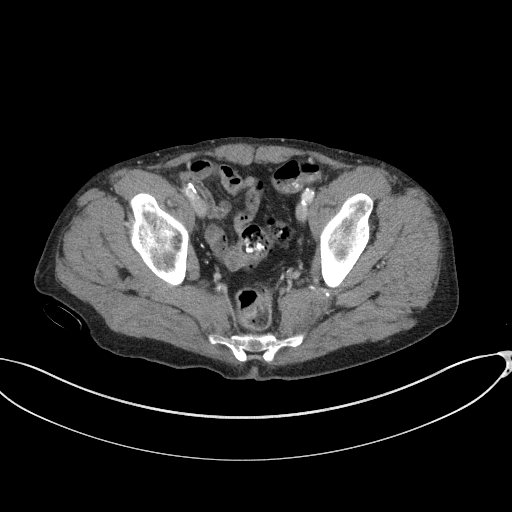
[im 43/99  soft-tissue]
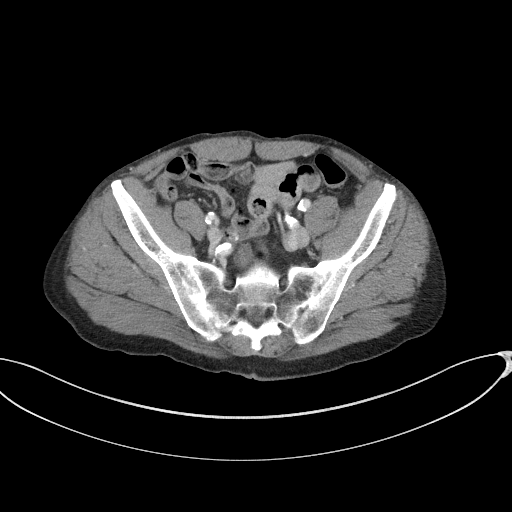
[im 50/99  soft-tissue]
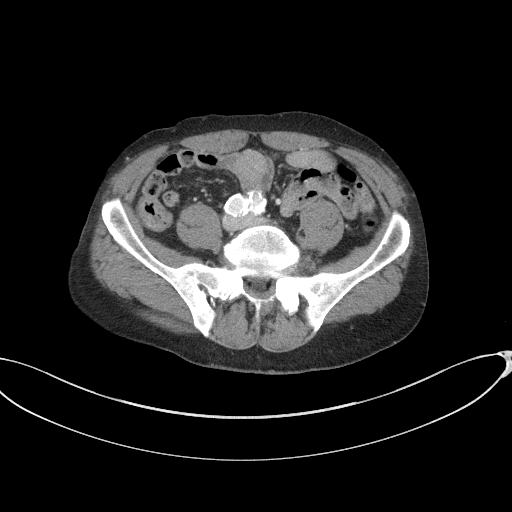
[im 57/99  soft-tissue]
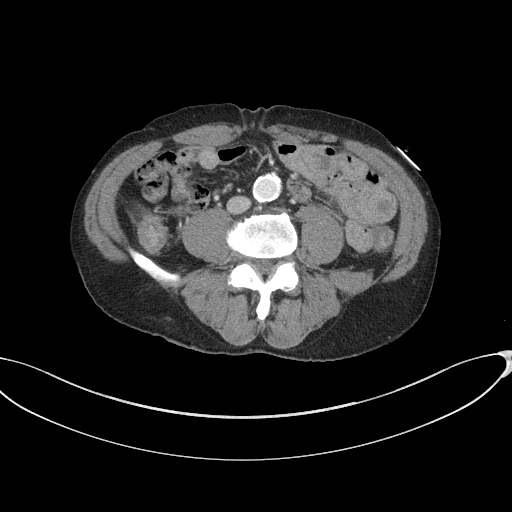
[im 64/99  soft-tissue]
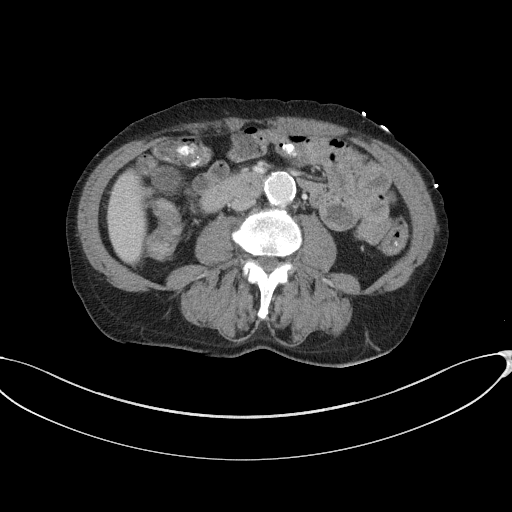
[im 64/99  bone]
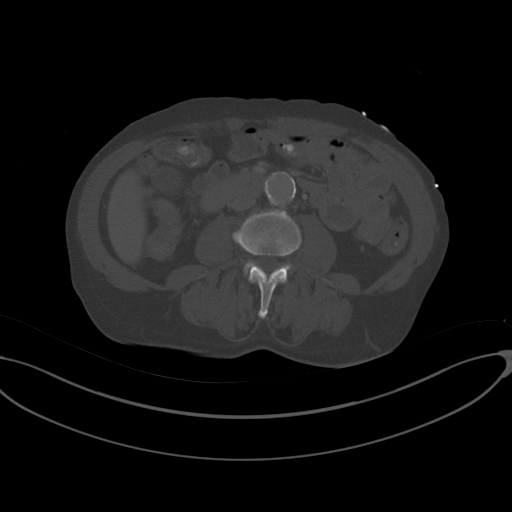
[im 71/99  soft-tissue]
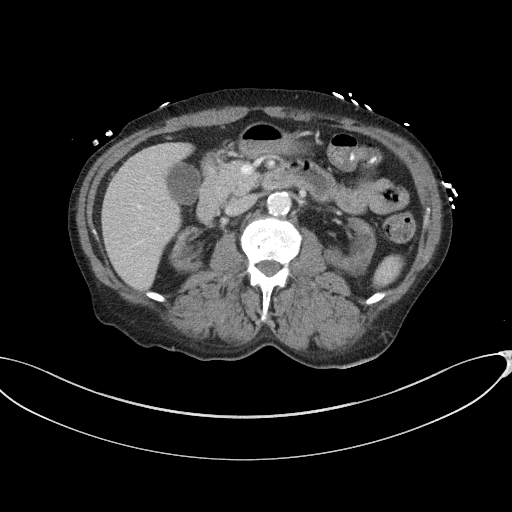
[im 78/99  soft-tissue]
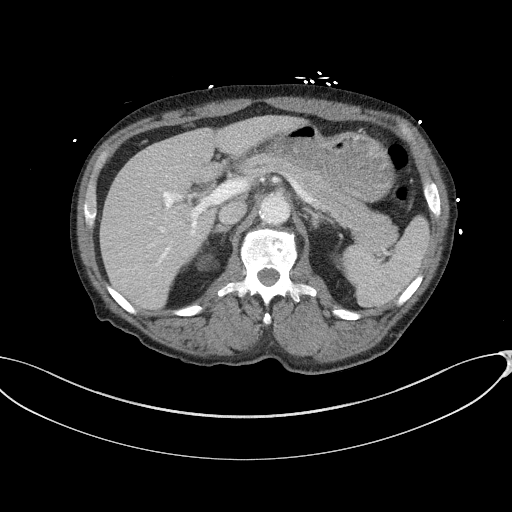
[im 85/99  soft-tissue]
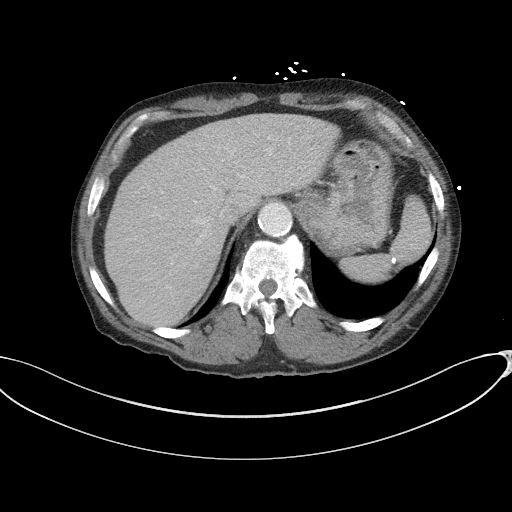
[im 92/99  soft-tissue]
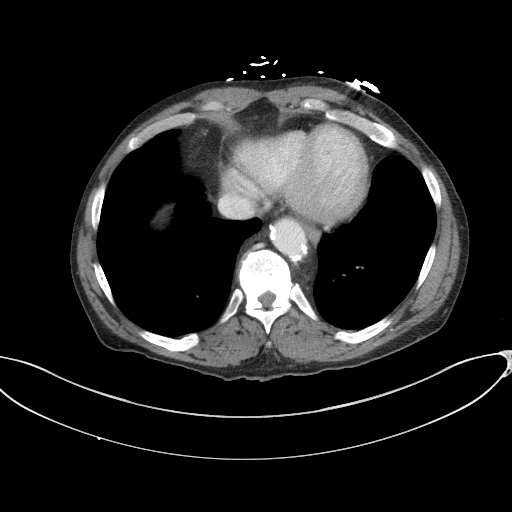

[16 of 46 positions shown; findings below may reference images not displayed]

FINDINGS: Lower chest: No acute abnormality.

Hepatobiliary: No focal liver abnormality is seen. No gallstones,
gallbladder wall thickening, or biliary dilatation.

Pancreas: Unremarkable. No pancreatic ductal dilatation or
surrounding inflammatory changes.

Spleen: Normal in size without focal abnormality.

Adrenals/Urinary Tract: The bilateral adrenal glands are
unremarkable. The native kidneys are atrophic with multiple cysts
consistent with a history of end-stage renal disease on
hemodialysis. There are no radiopaque obstructing kidney stones. The
bladder is decompressed which limits evaluation.

Stomach/Bowel: There is some mild wall thickening of the ascending
colon and cecum. The appendix is unremarkable. The stomach is
unremarkable. There is no evidence of a small-bowel obstruction.

Vascular/Lymphatic: Aortic atherosclerosis. No enlarged abdominal or
pelvic lymph nodes. The abdominal aorta is ectatic measuring up to
approximately 2.7 cm in diameter. The patient is status post
placement of a bypass graft for the left lower extremity. The
proximal graft appears patent. The proximal SFA on the right is
occluded. The right profunda femoris is patent. There appears to be
some moderate narrowing at the origin of the celiac axis.

Reproductive: The prostate gland is mildly enlarged. There are
multiple pockets of gas at the base of the scrotum. These are
favored to be external to patient but should be correlated with
physical exam and patient's symptoms.

Other: No abdominal wall hernia or abnormality. No abdominopelvic
ascites.

Musculoskeletal: No fracture is seen.
IMPRESSION: 1. There is a short segment of wall thickening involving the
ascending colon and cecum. Findings are suspicious for infectious or
inflammatory colitis.
2. Normal appendix in the right lower quadrant.
3. Atrophic kidneys with multiple cysts consistent with a history of
end-stage renal disease.
4. Multiple small pockets of gas at the base of the scrotum favored
to be external to the patient but should be correlated with the
patient's history and physical exam to help exclude a Fournier
gangrene.
5. Advanced atherosclerotic changes of the abdominal aorta. The
abdominal aorta is ectatic. Consider a 5 year follow-up with
ultrasound to confirm stability.
6. Patent left lower extremity bypass graft, the distal portion is
not visualized. The right superficial femoral artery is occluded
proximally, likely a chronic finding.

Aortic Atherosclerosis ([BW]-[BW]).

## 2018-12-24 IMAGING — CR PORTABLE CHEST - 1 VIEW
1 series · 1 of 1 positions shown · non-contrast
Comparison: Radiograph [DATE]. Chest CT [DATE]

CLINICAL DATA: Hypoglycemia.

EXAM:
PORTABLE CHEST 1 VIEW

[AP]
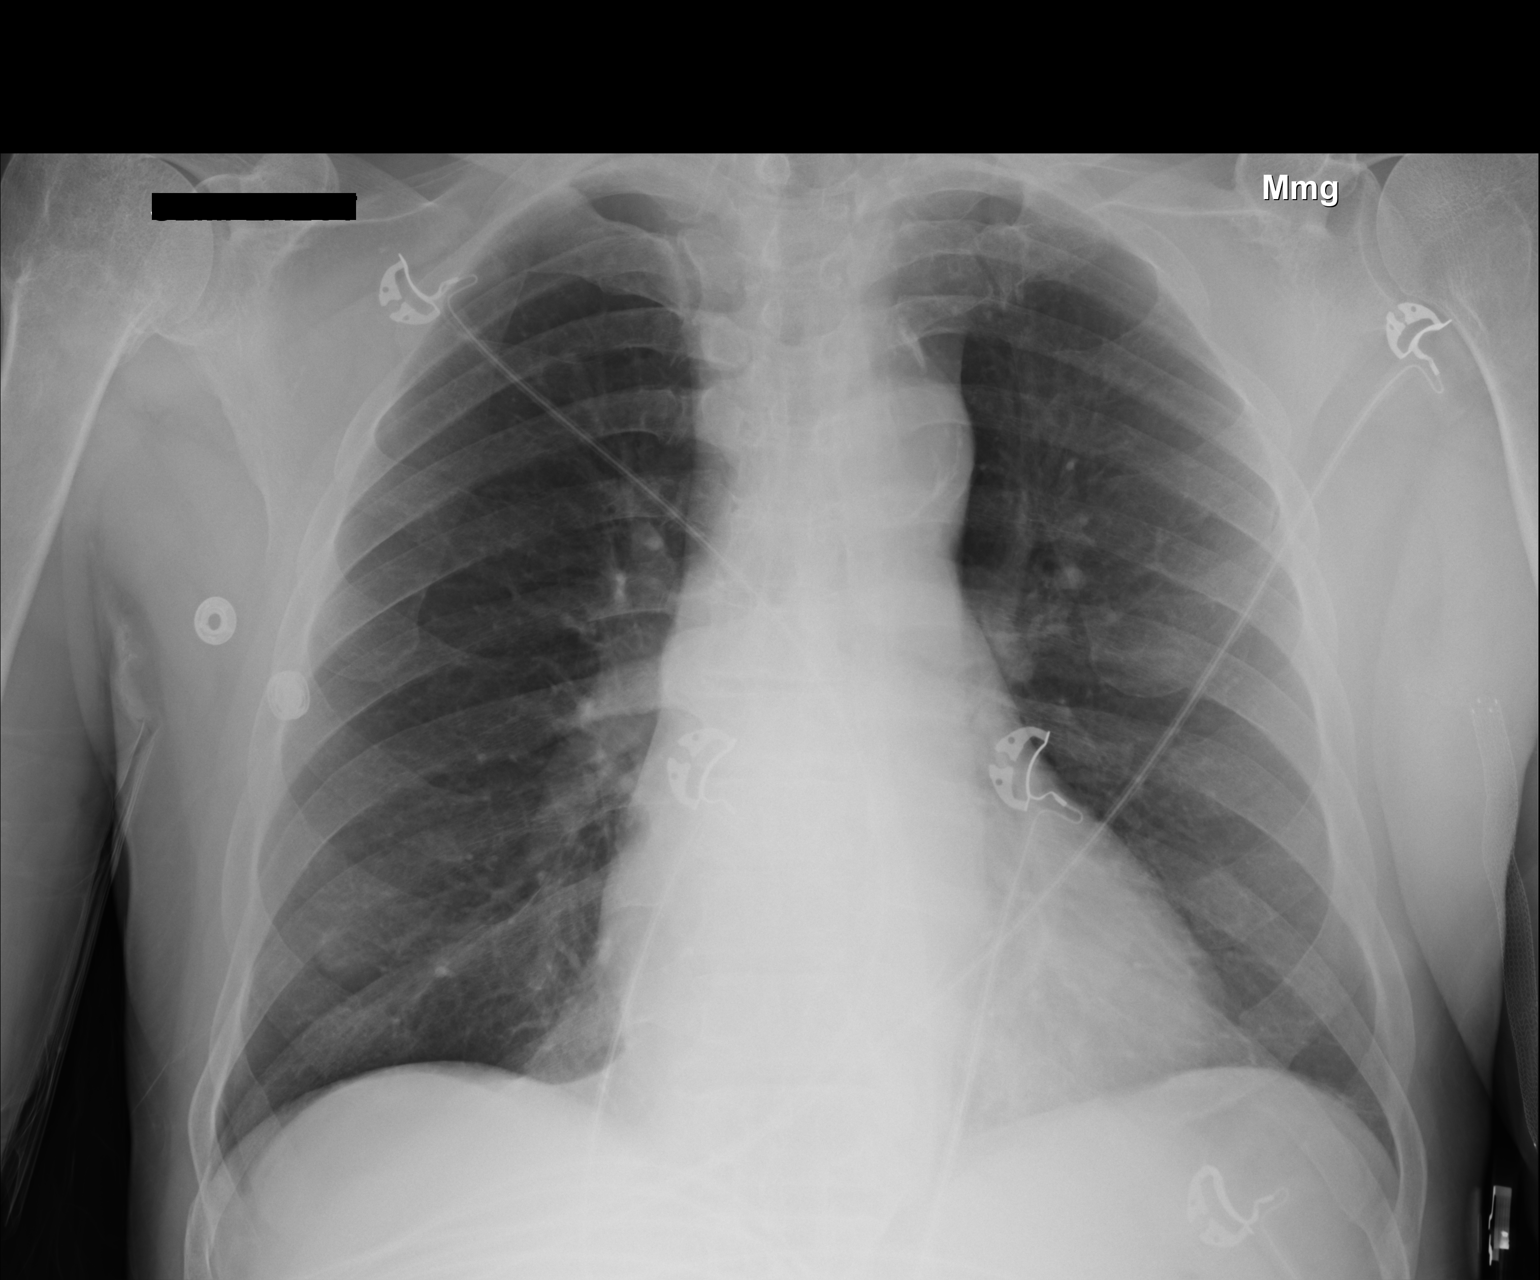

[1 of 1 positions shown; findings below may reference images not displayed]

FINDINGS: Mild cardiomegaly with improvement from prior exam. Aortic
atherosclerosis. No pulmonary edema, focal airspace disease, pleural
effusion or pneumothorax. Nodular density projecting over the right
lower lung zone is stable from prior exams and likely represents
nipple shadow.
IMPRESSION: Mild cardiomegaly, improved from [DATE] radiographs. No acute
cardiopulmonary findings.

## 2018-12-24 MED ORDER — ACETAMINOPHEN 325 MG PO TABS: 650.0000 mg | ORAL_TABLET | Freq: Four times a day (QID) | ORAL | Status: DC | PRN

## 2018-12-24 MED ORDER — ONDANSETRON HCL 4 MG/2ML IJ SOLN
4.0000 mg | Freq: Four times a day (QID) | INTRAMUSCULAR | Status: DC | PRN
  Filled 2018-12-24: qty 2

## 2018-12-24 MED ORDER — ONDANSETRON HCL 4 MG PO TABS: 4.0000 mg | ORAL_TABLET | Freq: Four times a day (QID) | ORAL | Status: DC | PRN

## 2018-12-24 MED ORDER — SODIUM CHLORIDE 0.9 % IV BOLUS
500.0000 mL | Freq: Once | INTRAVENOUS | Status: AC
  Administered 2018-12-24: 500 mL via INTRAVENOUS

## 2018-12-24 MED ORDER — HEPARIN SODIUM (PORCINE) 5000 UNIT/ML IJ SOLN
5000.0000 [IU] | Freq: Three times a day (TID) | INTRAMUSCULAR | Status: DC
  Administered 2018-12-24 – 2018-12-27 (×8): 5000 [IU] via SUBCUTANEOUS
  Filled 2018-12-24 (×7): qty 1

## 2018-12-24 MED ORDER — ONDANSETRON HCL 4 MG/2ML IJ SOLN
4.0000 mg | Freq: Once | INTRAMUSCULAR | Status: AC
  Administered 2018-12-24: 4 mg via INTRAVENOUS
  Filled 2018-12-24: qty 2

## 2018-12-24 MED ORDER — DEXTROSE 50 % IV SOLN
1.0000 | Freq: Once | INTRAVENOUS | Status: AC
  Administered 2018-12-24: 50 mL via INTRAVENOUS

## 2018-12-24 MED ORDER — DEXTROSE 50 % IV SOLN: 1.0000 | INTRAVENOUS | Status: DC | PRN

## 2018-12-24 MED ORDER — IOHEXOL 300 MG/ML  SOLN
100.0000 mL | Freq: Once | INTRAMUSCULAR | Status: AC | PRN
  Administered 2018-12-24: 100 mL via INTRAVENOUS

## 2018-12-24 MED ORDER — DEXTROSE 50 % IV SOLN
INTRAVENOUS | Status: AC
  Filled 2018-12-24: qty 50

## 2018-12-24 MED ORDER — ACETAMINOPHEN 650 MG RE SUPP: 650.0000 mg | Freq: Four times a day (QID) | RECTAL | Status: DC | PRN

## 2018-12-24 MED ORDER — AMIODARONE HCL 200 MG PO TABS
200.0000 mg | ORAL_TABLET | Freq: Every day | ORAL | Status: DC
  Administered 2018-12-25 – 2018-12-27 (×3): 200 mg via ORAL
  Filled 2018-12-24 (×3): qty 1

## 2018-12-24 NOTE — ED Notes (Signed)
X-ray at bedside

## 2018-12-24 NOTE — ED Notes (Signed)
Pt complaining of abd cramping and 9/10 pain

## 2018-12-24 NOTE — ED Notes (Signed)
ED TO INPATIENT HANDOFF REPORT  ED Nurse Name and Phone #: [phone] Doreene Adas Name/Age/Gender Raymond [name] 63 y.o. male Room/Bed: 046C/046C  Code Status   Code Status: Full Code  Home/SNF/Other Home Patient oriented to: self, place, time and situation Is this baseline? Yes   Triage Complete: Triage complete  Chief Complaint vomiting/dialysis pt   Triage Note Pt sent by UC for n/v, is dialysis pt. Denies any sob, is c/o slight abdominal pain   Allergies No Known Allergies  Level of Care/Admitting Diagnosis ED Disposition    ED Disposition Condition [facility] Area: [place] [[id number]]  Level of Care: Telemetry Medical [104]  Covid Evaluation: Screening Protocol (No Symptoms)  Diagnosis: Gastroenteritis [387564]  Admitting Physician: Lenore Cordia [[id number]]  Attending Physician: Lenore Cordia [[id number]]  Estimated length of stay: past midnight tomorrow  Certification:: I certify this patient will need inpatient services for at least 2 midnights  PT Class (Do Not Modify): Inpatient [101]  PT Acc Code (Do Not Modify): Private [1]       B Medical/Surgery History Past Medical History:  Diagnosis Date  . Anemia   . CAD (coronary artery disease)    a. 03/2018: cath showing 20 to 30% stenosis along the LAD, luminal irregularities along the RCA, and angiographically normal LCx  . COPD (chronic obstructive pulmonary disease) ([place])   . Dyspnea    "with too much fluid"  . ESRD (end stage renal disease) on dialysis [facility])    "MWF; Jeneen Rinks" (07/22/17)  . Hemodialysis patient ([place])   . Hepatitis C    Still positive s/p liver biopsy at [facility]  and interferon therapy for 6 months. Most recent lab work was on 10/24/12  . Hepatitis C    "took the tx; gone now" (12/05/2016)  Was treated  . History of blood transfusion ~ 2012/2013   "related to my kidneys; blood was low"  . Hypertension   . Substance abuse ([place])   . Thyroid disease     Past Surgical History:  Procedure Laterality Date  . ABDOMINAL AORTOGRAM W/LOWER EXTREMITY N/A 02/08/2018   Procedure: ABDOMINAL AORTOGRAM W/LOWER EXTREMITY;  Surgeon: Conrad [place], MD;  Location: [place] CV LAB;  Service: Cardiovascular;  Laterality: N/A;  . AV FISTULA PLACEMENT Left Aug. 2013 ?  . COLONOSCOPY WITH PROPOFOL N/A 08/21/2018   Procedure: COLONOSCOPY WITH PROPOFOL;  Surgeon: Yetta Flock, MD;  Location: [place];  Service: Gastroenterology;  Laterality: N/A;  . ESOPHAGOGASTRODUODENOSCOPY (EGD) WITH PROPOFOL N/A 08/20/2018   Procedure: ESOPHAGOGASTRODUODENOSCOPY (EGD) WITH PROPOFOL;  Surgeon: Gatha Mayer, MD;  Location: [place];  Service: Endoscopy;  Laterality: N/A;  . FEMORAL-POPLITEAL BYPASS GRAFT Left 04/11/2018   Procedure: BYPASS GRAFT FEMORAL-POPLITEAL ARTERY LEFT LEG;  Surgeon: Rosetta Posner, MD;  Location: [place];  Service: Vascular;  Laterality: Left;  . HOT HEMOSTASIS N/A 08/20/2018   Procedure: HOT HEMOSTASIS (ARGON PLASMA COAGULATION/BICAP);  Surgeon: Gatha Mayer, MD;  Location: [facility] ENDOSCOPY;  Service: Endoscopy;  Laterality: N/A;  . LEFT HEART CATH AND CORONARY ANGIOGRAPHY N/A 03/29/2018   Procedure: LEFT HEART CATH AND CORONARY ANGIOGRAPHY;  Surgeon: Nelva Bush, MD;  Location: [place] CV LAB;  Service: Cardiovascular;  Laterality: N/A;  . LIVER BIOPSY    . ORIF ULNAR FRACTURE Left 05/18/2017   Procedure: OPEN REDUCTION INTERNAL FIXATION (ORIF) ULNAR FRACTURE;  Surgeon: Iran Planas, MD;  Location: [place];  Service: Orthopedics;  Laterality: Left;  .  POLYPECTOMY  08/21/2018   Procedure: POLYPECTOMY;  Surgeon: Yetta Flock, MD;  Location: [facility] ENDOSCOPY;  Service: Gastroenterology;;  . SHUNTOGRAM N/A 12/11/2012   Procedure: Earney Mallet;  Surgeon: Serafina Mitchell, MD;  Location: [facility] CATH LAB;  Service: Cardiovascular;  Laterality: N/A;     A IV Location/Drains/Wounds Patient Lines/Drains/Airways Status   Active  Line/Drains/Airways    Name:   Placement date:   Placement time:   Site:   Days:   Peripheral IV 12/24/18 Right Antecubital   12/24/18    1334    Antecubital   less than 1   Fistula / Graft Left Forearm Arteriovenous fistula   -    -    Forearm             Intake/Output Last 24 hours No intake or output data in the 24 hours ending 12/24/18 2113  Labs/Imaging Results for orders placed or performed during the hospital encounter of 12/24/18 (from the past 48 hour(s))  Comprehensive metabolic panel     Status: Abnormal   Collection Time: 12/24/18  1:18 PM  Result Value Ref Range   Sodium 136 135 - 145 mmol/L   Potassium 4.8 3.5 - 5.1 mmol/L   Chloride 92 (L) 98 - 111 mmol/L   CO2 26 22 - 32 mmol/L   Glucose, Bld 95 70 - 99 mg/dL   BUN 56 (H) 8 - 23 mg/dL   Creatinine, Ser 14.05 (H) 0.61 - 1.24 mg/dL   Calcium 9.0 8.9 - 10.3 mg/dL   Total Protein 8.7 (H) 6.5 - 8.1 g/dL   Albumin 4.0 3.5 - 5.0 g/dL   AST 15 15 - 41 U/L   ALT 12 0 - 44 U/L   Alkaline Phosphatase 57 38 - 126 U/L   Total Bilirubin 0.5 0.3 - 1.2 mg/dL   GFR calc non Af Amer 3 (L) >60 mL/min   GFR calc Af Amer 4 (L) >60 mL/min   Anion gap 18 (H) 5 - 15    Comment: Performed at [facility], [address], [place], [place] [address]  Lipase, blood     Status: Abnormal   Collection Time: 12/24/18  1:18 PM  Result Value Ref Range   Lipase 54 (H) 11 - 51 U/L    Comment: Performed at [place] [address] [place], [place] [address]  CBC with Differential     Status: Abnormal   Collection Time: 12/24/18  1:18 PM  Result Value Ref Range   WBC 4.6 4.0 - 10.5 K/uL   RBC 3.55 (L) 4.22 - 5.81 MIL/uL   Hemoglobin 11.6 (L) 13.0 - 17.0 g/dL   HCT 37.4 (L) 39.0 - 52.0 %   MCV 105.4 (H) 80.0 - 100.0 fL   MCH 32.7 26.0 - 34.0 pg   MCHC 31.0 30.0 - 36.0 g/dL   RDW 15.4 11.5 - 15.5 %   Platelets 152 150 - 400 K/uL   nRBC 0.0 0.0 - 0.2 %   Neutrophils Relative % 73 %   Neutro Abs 3.3 1.7 - 7.7 K/uL    Lymphocytes Relative 13 %   Lymphs Abs 0.6 (L) 0.7 - 4.0 K/uL   Monocytes Relative 14 %   Monocytes Absolute 0.7 0.1 - 1.0 K/uL   Eosinophils Relative 0 %   Eosinophils Absolute 0.0 0.0 - 0.5 K/uL   Basophils Relative 0 %   Basophils Absolute 0.0 0.0 - 0.1 K/uL   Immature Granulocytes 0 %  Abs Immature Granulocytes 0.02 0.00 - 0.07 K/uL    Comment: Performed at [facility], [place] [address] [place], [place] [address]  CBG monitoring, ED     Status: Abnormal   Collection Time: 12/24/18  1:52 PM  Result Value Ref Range   Glucose-Capillary <10 (LL) 70 - 99 mg/dL  CBG monitoring, ED     Status: Abnormal   Collection Time: 12/24/18  2:06 PM  Result Value Ref Range   Glucose-Capillary 211 (H) 70 - 99 mg/dL  CBG monitoring, ED     Status: Abnormal   Collection Time: 12/24/18  3:17 PM  Result Value Ref Range   Glucose-Capillary 113 (H) 70 - 99 mg/dL  CBG monitoring, ED     Status: None   Collection Time: 12/24/18  5:31 PM  Result Value Ref Range   Glucose-Capillary 84 70 - 99 mg/dL   Ct Abdomen Pelvis W Contrast  Result Date: 12/24/2018 CLINICAL DATA:  Nausea and vomiting. Dialysis patient. Slight shortness of breath EXAM: CT ABDOMEN AND PELVIS WITH CONTRAST TECHNIQUE: Multidetector CT imaging of the abdomen and pelvis was performed using the standard protocol following bolus administration of intravenous contrast. CONTRAST:  187mL OMNIPAQUE IOHEXOL 300 MG/ML  SOLN COMPARISON:  CT dated 03/31/2018. FINDINGS: Lower chest: No acute abnormality. Hepatobiliary: No focal liver abnormality is seen. No gallstones, gallbladder wall thickening, or biliary dilatation. Pancreas: Unremarkable. No pancreatic ductal dilatation or surrounding inflammatory changes. Spleen: Normal in size without focal abnormality. Adrenals/Urinary Tract: The bilateral adrenal glands are unremarkable. The native kidneys are atrophic with multiple cysts consistent with a history of end-stage renal disease on hemodialysis.  There are no radiopaque obstructing kidney stones. The bladder is decompressed which limits evaluation. Stomach/Bowel: There is some mild wall thickening of the ascending colon and cecum. The appendix is unremarkable. The stomach is unremarkable. There is no evidence of a small-bowel obstruction. Vascular/Lymphatic: Aortic atherosclerosis. No enlarged abdominal or pelvic lymph nodes. The abdominal aorta is ectatic measuring up to approximately 2.7 cm in diameter. The patient is status post placement of a bypass graft for the left lower extremity. The proximal graft appears patent. The proximal SFA on the right is occluded. The right profunda femoris is patent. There appears to be some moderate narrowing at the origin of the celiac axis. Reproductive: The prostate gland is mildly enlarged. There are multiple pockets of gas at the base of the scrotum. These are favored to be external to patient but should be correlated with physical exam and patient's symptoms. Other: No abdominal wall hernia or abnormality. No abdominopelvic ascites. Musculoskeletal: No fracture is seen. IMPRESSION: 1. There is a short segment of wall thickening involving the ascending colon and cecum. Findings are suspicious for infectious or inflammatory colitis. 2. Normal appendix in the right lower quadrant. 3. Atrophic kidneys with multiple cysts consistent with a history of end-stage renal disease. 4. Multiple small pockets of gas at the base of the scrotum favored to be external to the patient but should be correlated with the patient's history and physical exam to help exclude a Fournier gangrene. 5. Advanced atherosclerotic changes of the abdominal aorta. The abdominal aorta is ectatic. Consider a 5 year follow-up with ultrasound to confirm stability. 6. Patent left lower extremity bypass graft, the distal portion is not visualized. The right superficial femoral artery is occluded proximally, likely a chronic finding. Aortic Atherosclerosis  (ICD10-I70.0). Electronically Signed   By: Constance Holster M.D.   On: 12/24/2018 20:04   Dg Chest  Port 1 View  Result Date: 12/24/2018 CLINICAL DATA:  Hypoglycemia. EXAM: PORTABLE CHEST 1 VIEW COMPARISON:  Radiograph 09/11/2018. Chest CT 08/10/2018 FINDINGS: Mild cardiomegaly with improvement from prior exam. Aortic atherosclerosis. No pulmonary edema, focal airspace disease, pleural effusion or pneumothorax. Nodular density projecting over the right lower lung zone is stable from prior exams and likely represents nipple shadow. IMPRESSION: Mild cardiomegaly, improved from March 2020 radiographs. No acute cardiopulmonary findings. Electronically Signed   By: Keith Rake M.D.   On: 12/24/2018 19:17    Pending Labs Unresulted Labs (From admission, onward)    Start     Ordered   12/25/18 0500  CBC  Tomorrow morning,   R     12/24/18 2104   12/25/18 0500  Renal function panel  Tomorrow morning,   R     12/24/18 2104   12/24/18 2000  SARS Coronavirus 2  Once,   R     12/24/18 2000   12/24/18 1922  Culture, blood (Routine X 2) w Reflex to ID Panel  BLOOD CULTURE X 2,   R (with STAT occurrences)     12/24/18 1922   12/24/18 1843  Urinalysis, Routine w reflex microscopic  ONCE - STAT,   STAT     12/24/18 1842          Vitals/Pain Today's Vitals   12/24/18 1940 12/24/18 2100 12/24/18 2105 12/24/18 2106  BP:   96/73 96/73  Pulse:    75  Resp:   11 10  Temp:      SpO2:    100%  PainSc: 5  5       Isolation Precautions No active isolations  Medications Medications  heparin injection 5,000 Units (has no administration in time range)  acetaminophen (TYLENOL) tablet 650 mg (has no administration in time range)    Or  acetaminophen (TYLENOL) suppository 650 mg (has no administration in time range)  ondansetron (ZOFRAN) tablet 4 mg (has no administration in time range)    Or  ondansetron (ZOFRAN) injection 4 mg (has no administration in time range)  sodium chloride 0.9 % bolus  500 mL (has no administration in time range)  amiodarone (PACERONE) tablet 200 mg (has no administration in time range)  dextrose 50 % solution 50 mL (has no administration in time range)  sodium chloride 0.9 % bolus 500 mL (0 mLs Intravenous Stopped 12/24/18 1851)  ondansetron (ZOFRAN) injection 4 mg (4 mg Intravenous Given 12/24/18 1359)  dextrose 50 % solution 50 mL (50 mLs Intravenous Given 12/24/18 1357)  iohexol (OMNIPAQUE) 300 MG/ML solution 100 mL (100 mLs Intravenous Contrast Given 12/24/18 1927)    Mobility walks Low fall risk   Focused Assessments Renal Assessment Handoff:  Hemodialysis Schedule: Hemodialysis Schedule: Tuesday/Thursday/Saturday Last Hemodialysis date and time: Saturday   Restricted appendage: left      R Recommendations: See Admitting Provider Note  Report given to:   Additional Notes:

## 2018-12-24 NOTE — ED Notes (Signed)
Pt dialysis pt with N/V; vomiting at present; pt to go to ED for further eval

## 2018-12-24 NOTE — ED Notes (Signed)
OJ given to pt for CBG. Pt was sleeping and had spontaneous tachycardia in 130s with BP 71/52. 500 bolus already going. EDP at bedside. Pt woke up and arrhythmia subsided after 2-3 min. CBG 83, pt AO x4. Admitting provider paged.

## 2018-12-24 NOTE — ED Provider Notes (Signed)
[facility] Emergency Department Provider Note MRN:  [id number]  Arrival date & time: 12/24/18     Chief Complaint   Emesis and dialysis pt   History of Present Illness   Raymond [name] is a 63 y.o. year-old male with a history of CAD, COPD, ESRD presenting to the ED with chief complaint of emesis.  Patient explains that he began feeling generally unwell and weak after returning home from his dialysis session 2 days ago.  Began experiencing nausea, nonbloody nonbilious emesis, as well as watery diarrhea, persistent since that time.  Today with dull frontal headache, mild in severity, gradual onset.  Denying fever, no cough, no chest pain or shortness of breath, mild dull abdominal cramping which is intermittent.  No exacerbating or alleviating factors.  Review of Systems  A complete 10 system review of systems was obtained and all systems are negative except as noted in the HPI and PMH.   Patient's Health History    Past Medical History:  Diagnosis Date  . Anemia   . CAD (coronary artery disease)    a. 03/2018: cath showing 20 to 30% stenosis along the LAD, luminal irregularities along the RCA, and angiographically normal LCx  . COPD (chronic obstructive pulmonary disease) ([place])   . Dyspnea    "with too much fluid"  . ESRD (end stage renal disease) on dialysis [facility])    "MWF; Jeneen Rinks" (07/22/17)  . Hemodialysis patient ([place])   . Hepatitis C    Still positive s/p liver biopsy at [facility]  and interferon therapy for 6 months. Most recent lab work was on 10/24/12  . Hepatitis C    "took the tx; gone now" (12/05/2016)  Was treated  . History of blood transfusion ~ 2012/2013   "related to my kidneys; blood was low"  . Hypertension   . Substance abuse ([place])   . Thyroid disease     Past Surgical History:  Procedure Laterality Date  . ABDOMINAL AORTOGRAM W/LOWER EXTREMITY N/A 02/08/2018   Procedure: ABDOMINAL AORTOGRAM W/LOWER EXTREMITY;  Surgeon: Conrad [place], MD;   Location: [place] CV LAB;  Service: Cardiovascular;  Laterality: N/A;  . AV FISTULA PLACEMENT Left Aug. 2013 ?  . COLONOSCOPY WITH PROPOFOL N/A 08/21/2018   Procedure: COLONOSCOPY WITH PROPOFOL;  Surgeon: Yetta Flock, MD;  Location: [place];  Service: Gastroenterology;  Laterality: N/A;  . ESOPHAGOGASTRODUODENOSCOPY (EGD) WITH PROPOFOL N/A 08/20/2018   Procedure: ESOPHAGOGASTRODUODENOSCOPY (EGD) WITH PROPOFOL;  Surgeon: Gatha Mayer, MD;  Location: [place];  Service: Endoscopy;  Laterality: N/A;  . FEMORAL-POPLITEAL BYPASS GRAFT Left 04/11/2018   Procedure: BYPASS GRAFT FEMORAL-POPLITEAL ARTERY LEFT LEG;  Surgeon: Rosetta Posner, MD;  Location: [place];  Service: Vascular;  Laterality: Left;  . HOT HEMOSTASIS N/A 08/20/2018   Procedure: HOT HEMOSTASIS (ARGON PLASMA COAGULATION/BICAP);  Surgeon: Gatha Mayer, MD;  Location: [facility] ENDOSCOPY;  Service: Endoscopy;  Laterality: N/A;  . LEFT HEART CATH AND CORONARY ANGIOGRAPHY N/A 03/29/2018   Procedure: LEFT HEART CATH AND CORONARY ANGIOGRAPHY;  Surgeon: Nelva Bush, MD;  Location: [place] CV LAB;  Service: Cardiovascular;  Laterality: N/A;  . LIVER BIOPSY    . ORIF ULNAR FRACTURE Left 05/18/2017   Procedure: OPEN REDUCTION INTERNAL FIXATION (ORIF) ULNAR FRACTURE;  Surgeon: Iran Planas, MD;  Location: [place];  Service: Orthopedics;  Laterality: Left;  . POLYPECTOMY  08/21/2018   Procedure: POLYPECTOMY;  Surgeon: Yetta Flock, MD;  Location: [facility] ENDOSCOPY;  Service: Gastroenterology;;  .  SHUNTOGRAM N/A 12/11/2012   Procedure: Earney Mallet;  Surgeon: Serafina Mitchell, MD;  Location: [facility] CATH LAB;  Service: Cardiovascular;  Laterality: N/A;    Family History  Problem Relation Age of Onset  . Diabetes Father   . Hypertension Father   . Heart disease Father     Social History   Socioeconomic History  . Marital status: Single    Spouse name: Not on file  . Number of children: Not on file  . Years of education: Not on  file  . Highest education level: Not on file  Occupational History  . Not on file  Social Needs  . Financial resource strain: Hard  . Food insecurity    Worry: Sometimes true    Inability: Sometimes true  . Transportation needs    Medical: No    Non-medical: No  Tobacco Use  . Smoking status: Current Every Day Smoker    Packs/day: 1.00    Years: 25.00    Pack years: 25.00    Types: Cigarettes    Last attempt to quit: 08/01/2011    Years since quitting: 7.4  . Smokeless tobacco: Never Used  . Tobacco comment: he smokes 6 cigarettes a week  Substance and Sexual Activity  . Alcohol use: Yes    Comment: occasionally/ one half pint  . Drug use: Yes    Types: Cocaine    Comment: he used yesterday  . Sexual activity: Not on file  Lifestyle  . Physical activity    Days per week: Not on file    Minutes per session: Not on file  . Stress: Not on file  Relationships  . Social Herbalist on phone: Three times a week    Gets together: Never    Attends religious service: Never    Active member of club or organization: No    Attends meetings of clubs or organizations: Never    Relationship status: Not on file  . Intimate partner violence    Fear of current or ex partner: No    Emotionally abused: No    Physically abused: No    Forced sexual activity: No  Other Topics Concern  . Not on file  Social History Narrative   Pt. Is homeless.  He lives in shelters and motels when he can afford.       Physical Exam  Vital Signs and Nursing Notes reviewed Vitals:   12/24/18 1900 12/24/18 1939  BP: 121/75 94/61  Pulse:    Resp: 14 14  Temp:    SpO2:      CONSTITUTIONAL: Chronically ill-appearing, NAD NEURO:  Alert and oriented x 3, no focal deficits EYES:  eyes equal and reactive ENT/NECK:  no LAD, no JVD CARDIO: Regular rate, well-perfused, normal S1 and S2 PULM:  CTAB no wheezing or rhonchi GI/GU:  normal bowel sounds, non-distended, non-tender MSK/SPINE:  No  gross deformities, no edema SKIN:  no rash, atraumatic PSYCH:  Appropriate speech and behavior  Diagnostic and Interventional Summary    EKG Interpretation  Date/Time:  Monday December 24 2018 13:44:27 EDT Ventricular Rate:  75 PR Interval:    QRS Duration: 98 QT Interval:  423 QTC Calculation: 473 R Axis:   46 Text Interpretation:  Sinus rhythm Consider left atrial enlargement Probable left ventricular hypertrophy Confirmed by Gerlene Fee [phone]) on 12/24/2018 1:50:04 PM Also confirmed by Gerlene Fee [phone]), editor Philomena Doheny [phone])  on 12/24/2018 2:28:14 PM      Labs Reviewed  COMPREHENSIVE METABOLIC PANEL - Abnormal; Notable for the following components:      Result Value   Chloride 92 (*)    BUN 56 (*)    Creatinine, Ser 14.05 (*)    Total Protein 8.7 (*)    GFR calc non Af Amer 3 (*)    GFR calc Af Amer 4 (*)    Anion gap 18 (*)    All other components within normal limits  LIPASE, BLOOD - Abnormal; Notable for the following components:   Lipase 54 (*)    All other components within normal limits  CBC WITH DIFFERENTIAL/PLATELET - Abnormal; Notable for the following components:   RBC 3.55 (*)    Hemoglobin 11.6 (*)    HCT 37.4 (*)    MCV 105.4 (*)    Lymphs Abs 0.6 (*)    All other components within normal limits  CBG MONITORING, ED - Abnormal; Notable for the following components:   Glucose-Capillary <10 (*)    All other components within normal limits  CBG MONITORING, ED - Abnormal; Notable for the following components:   Glucose-Capillary 211 (*)    All other components within normal limits  CBG MONITORING, ED - Abnormal; Notable for the following components:   Glucose-Capillary 113 (*)    All other components within normal limits  CULTURE, BLOOD (ROUTINE X 2)  CULTURE, BLOOD (ROUTINE X 2)  SARS CORONAVIRUS 2 (HOSPITAL ORDER, [place] LAB)  URINALYSIS, ROUTINE W REFLEX MICROSCOPIC  CBG MONITORING, ED    DG Chest Port 1 View   Final Result    CT ABDOMEN PELVIS W CONTRAST    (Results Pending)    Medications  sodium chloride 0.9 % bolus 500 mL (0 mLs Intravenous Stopped 12/24/18 1851)  ondansetron (ZOFRAN) injection 4 mg (4 mg Intravenous Given 12/24/18 1359)  dextrose 50 % solution 50 mL (50 mLs Intravenous Given 12/24/18 1357)  iohexol (OMNIPAQUE) 300 MG/ML solution 100 mL (100 mLs Intravenous Contrast Given 12/24/18 1927)     Procedures Critical Care  ED Course and Medical Decision Making  I have reviewed the triage vital signs and the nursing notes.  Pertinent labs & imaging results that were available during my care of the patient were reviewed by me and considered in my medical decision making (see below for details).  Considering metabolic disarray, gastroenteritis, patient has a normal neurological exam and I have a very low suspicion for CNS etiology of patient's nausea.  Dull frontal headache thought to be related to dehydration as well, will monitor closely.  Patient's abdomen is soft and nontender, he is requesting food, currently without indication for CT imaging.  Will provide IV fluids, obtain labs, reassess.  Update 7:35 PM: Patient had an episode of hypoglycemia here in the emergency department, detected at less than 10.  However patient still maintained his mental status during this time and so I had some suspicion for lab error.  In the meantime patient's blood pressure continues to downtrend, in the 85U systolic and according to chart review his blood pressures normally elevated.  He is having shaking chills here in the emergency department.  He continues to have a benign and nontender abdomen, however given these changes will obtain CT abdomen and consult medicine for admission.  Barth Kirks. Sedonia Small, MD [place] [email or web address]  Final Clinical Impressions(s) / ED Diagnoses     ICD-10-CM   1. Hypotension, unspecified hypotension type  I95.9    2.  Hypoglycemia  E16.2 DG Chest [facility] [place] 1 View    DG Chest [place] 1 View  3. Nausea vomiting and diarrhea  R11.2    R19.7     ED Discharge Orders    None         Maudie Flakes, MD 12/24/18 1958

## 2018-12-24 NOTE — H&P (Signed)
History and Physical    Raymond [name] MBW:[id number] DOB: 1956-05-25 DOA: 12/24/2018  PCP: [name], FNP  Patient coming from: Home  I have personally briefly reviewed patient's old medical records in [place]  Chief Complaint: Nausea, vomiting, diarrhea  HPI: Raymond [name] is a 63 y.o. 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  Patient states he was in his usual state of health until after reportedly completing a full HD session on 12/22/2018.  Afterwards when he was at home he developed new onset of nausea, vomiting, and diarrhea.  He says he has not had much oral intake since that time due to recurrent symptoms.  He tried Pepto-Bismol for symptoms without relief.  He has had intermittent hot and cold spells.  He denies any subjective fevers or diaphoresis.  He denies any chest pain, dyspnea, or swelling in his legs.  He says he no longer makes urine.  He reports smoking about 5 cigarettes/day.  He reports occasional alcohol use without history of withdrawal.  He admits to cocaine use and reports last use about 1 month ago.  ED Course:  Initial vitals showed BP 120/90, pulse 77, RR 11, temp 97.6 Fahrenheit, SPO2 100% on room air.  Labs are notable for WBC 4.6, hemoglobin 11.6, platelets 152,000, Sodium 136, potassium 4.8, bicarb 26, BUN 56, creatinine 14.05, AST 15, ALT 12, alk phos 57, total bilirubin 0.5.  Lipase 54.    Serum glucose 95.  Subsequent CBG was <10.  Patient was given 1 amp of D50 and repeat CBG trend was 211 >> 113 >> 84.  Portable chest x-ray was negative for focal consolidation, infiltrate, or effusion/pulmonary edema.  CT abdomen/pelvis with contrast was notable for  wall thickening of the ascending colon and cecum suspicious for infectious versus inflammatory colitis.  Blood cultures were obtained and patient was given 500 mLs normal saline.  SARS-CoV-2 test was obtained and pending.  The hospitalist service was consulted to admit for further evaluation and management.  Review of Systems: All systems reviewed and are negative except as documented in history of present illness above.   Past Medical History:  Diagnosis Date   Anemia    CAD (coronary artery disease)    a. 03/2018: cath showing 84 to 30% stenosis along the LAD, luminal irregularities along the RCA, and angiographically normal LCx   COPD (chronic obstructive pulmonary disease) (HCC)    Dyspnea    "with too much fluid"   ESRD (end stage renal disease) on dialysis [facility])    "MWF; J[facility] Rinks" (07/22/17)   Hemodialysis patient ([place])    Hepatitis C    Still positive s/p liver biopsy at [facility]  and interferon therapy for 6 months. Most recent lab work was on 10/24/12   Hepatitis C    "took the tx; gone now" (12/05/2016)  Was treated   History of blood transfusion ~ 2012/2013   "related to my kidneys; blood was low"   Hypertension    Substance abuse ([place])    Thyroid disease     Past Surgical History:  Procedure Laterality Date   ABDOMINAL AORTOGRAM W/LOWER EXTREMITY N/A 02/08/2018   Procedure: ABDOMINAL AORTOGRAM W/LOWER EXTREMITY;  Surgeon: Conrad Rison, MD;  Location: [place] [facility];  Service: Cardiovascular;  Laterality: N/A;   AV FISTULA PLACEMENT Left Aug. 2013 ?   COLONOSCOPY WITH PROPOFOL N/A 08/21/2018   Procedure: COLONOSCOPY WITH PROPOFOL;  Surgeon: Yetta Flock, MD;  Location: [place];  Service: Gastroenterology;  Laterality: N/A;   ESOPHAGOGASTRODUODENOSCOPY (EGD) WITH PROPOFOL N/A 08/20/2018   Procedure: ESOPHAGOGASTRODUODENOSCOPY (EGD) WITH PROPOFOL;  Surgeon: Gatha Mayer, MD;  Location: [place];  Service: Endoscopy;  Laterality: N/A;     FEMORAL-POPLITEAL BYPASS GRAFT Left 04/11/2018   Procedure: BYPASS GRAFT FEMORAL-POPLITEAL ARTERY LEFT LEG;  Surgeon: Rosetta Posner, MD;  Location: [facility] OR;  Service: Vascular;  Laterality: Left;   HOT HEMOSTASIS N/A 08/20/2018   Procedure: HOT HEMOSTASIS (ARGON PLASMA COAGULATION/BICAP);  Surgeon: Gatha Mayer, MD;  Location: [facility] ENDOSCOPY;  Service: Endoscopy;  Laterality: N/A;   LEFT HEART CATH AND CORONARY ANGIOGRAPHY N/A 03/29/2018   Procedure: LEFT HEART CATH AND CORONARY ANGIOGRAPHY;  Surgeon: Nelva Bush, MD;  Location: [place] [facility];  Service: Cardiovascular;  Laterality: N/A;   LIVER BIOPSY     ORIF ULNAR FRACTURE Left 05/18/2017   Procedure: OPEN REDUCTION INTERNAL FIXATION (ORIF) ULNAR FRACTURE;  Surgeon: Iran Planas, MD;  Location: [facility];  Service: Orthopedics;  Laterality: Left;   POLYPECTOMY  08/21/2018   Procedure: POLYPECTOMY;  Surgeon: Yetta Flock, MD;  Location: [place];  Service: Gastroenterology;;   Earney Mallet N/A 12/11/2012   Procedure: [place] Mal[place];  Surgeon: Serafina Mitchell, MD;  Location: [facility] CATH LAB;  Service: Cardiovascular;  Laterality: N/A;    Social History:  reports that he has been smoking cigarettes. He has a 25.00 pack-year smoking history. He has never used smokeless tobacco. He reports current alcohol use. He reports current drug use. Drug: Cocaine.  No Known Allergies  Family History  Problem Relation Age of Onset   Diabetes Father    Hypertension Father    Heart disease Father      Prior to Admission medications   Medication Sig Start Date End Date Taking? Authorizing Provider  amiodarone (PACERONE) 200 MG tablet Take 1 tablet (200 mg total) by mouth daily. 09/14/18  Yes Vasireddy, Grier Mitts, MD  amLODipine (NORVASC) 2.5 MG tablet Take 1 tablet (2.5 mg total) by mouth daily. 09/14/18  Yes Vasireddy, Grier Mitts, MD  bismuth subsalicylate (PEPTO BISMOL) 262 MG chewable tablet Chew 524 mg by mouth as needed for indigestion or  diarrhea or loose stools.   Yes [provider]  calcitRIOL (ROCALTROL) 0.5 MCG capsule Take 3 capsules (1.5 mcg total) by mouth 3 (three) times a week. 09/17/18  Yes Vasireddy, Grier Mitts, MD  metoprolol succinate (TOPROL-XL) 50 MG 24 hr tablet Take 1 tablet (50 mg total) by mouth daily for 30 days. Take with or immediately following a meal. 09/14/18 12/24/18 Yes Vasireddy, Grier Mitts, MD  sevelamer carbonate (RENVELA) 800 MG tablet Take 4 tablets (3,200 mg total) by mouth 3 (three) times daily with meals. Patient taking differently: Take 4,800 mg by mouth 3 (three) times daily with meals. Six tablets with each meal 09/14/18  Yes Vasireddy, Grier Mitts, MD  doxycycline (VIBRAMYCIN) 50 MG capsule Take 2 capsules (100 mg total) by mouth 2 (two) times daily. Patient not taking: Reported on 12/24/2018 09/14/18   Monica Becton, MD  predniSONE (DELTASONE) 10 MG tablet Take 2 tablets (20 mg total) by mouth 2 (two) times daily with a meal. Patient not taking: Reported on 12/24/2018 09/14/18   Monica Becton, MD    Physical Exam: Vitals:  12/24/18 1800 12/24/18 1830 12/24/18 1900 12/24/18 1939  BP: 110/83 96/69 121/75 94/61  Pulse: 83 75    Resp: '16 14 14 14  ' Temp:      SpO2: 100% 100%      Constitutional: Resting supine in bed, NAD, calm, comfortable Eyes: PERRL, lids and conjunctivae normal ENMT: Mucous membranes are dry. Posterior pharynx clear of any exudate or lesions. Neck: normal, supple, no masses. Respiratory: clear to auscultation bilaterally, no wheezing, no crackles. Normal respiratory effort. No accessory muscle use.  Cardiovascular: Regular rate and rhythm, systolic flow murmur present.  No extremity edema. 2+ pedal pulses.  LUE AVF with palpable thrill. Abdomen: no tenderness, no masses palpated. No hepatosplenomegaly. Bowel sounds positive.  Musculoskeletal: no clubbing / cyanosis. No joint deformity upper and lower extremities. Good ROM, no contractures. Normal muscle tone.  Skin:  no rashes, lesions, ulcers. No induration Neurologic: CN 2-12 grossly intact. Sensation intact, Strength 5/5 in all 4.  Psychiatric: Normal judgment and insight. Alert and oriented x 3. Normal mood.   Labs on Admission: I have personally reviewed following labs and imaging studies  CBC: Recent Labs  Lab 12/24/18 1318  WBC 4.6  NEUTROABS 3.3  HGB 11.6*  HCT 37.4*  MCV 105.4*  PLT 517   Basic Metabolic Panel: Recent Labs  Lab 12/24/18 1318  NA 136  K 4.8  CL 92*  CO2 26  GLUCOSE 95  BUN 56*  CREATININE 14.05*  CALCIUM 9.0   GFR: CrCl cannot be calculated (Unknown ideal weight.). Liver Function Tests: Recent Labs  Lab 12/24/18 1318  AST 15  ALT 12  ALKPHOS 57  BILITOT 0.5  PROT 8.7*  ALBUMIN 4.0   Recent Labs  Lab 12/24/18 1318  LIPASE 54*   No results for input(s): AMMONIA in the last 168 hours. Coagulation Profile: No results for input(s): INR, PROTIME in the last 168 hours. Cardiac Enzymes: No results for input(s): CKTOTAL, CKMB, CKMBINDEX, TROPONINI in the last 168 hours. BNP (last 3 results) No results for input(s): PROBNP in the last 8760 hours. HbA1C: No results for input(s): HGBA1C in the last 72 hours. CBG: Recent Labs  Lab 12/24/18 1352 12/24/18 1406 12/24/18 1517 12/24/18 1731  GLUCAP <10* 211* 113* 84   Lipid Profile: No results for input(s): CHOL, HDL, LDLCALC, TRIG, CHOLHDL, LDLDIRECT in the last 72 hours. Thyroid Function Tests: No results for input(s): TSH, T4TOTAL, FREET4, T3FREE, THYROIDAB in the last 72 hours. Anemia Panel: No results for input(s): VITAMINB12, FOLATE, FERRITIN, TIBC, IRON, RETICCTPCT in the last 72 hours. Urine analysis:    Component Value Date/Time   COLORURINE YELLOW 05/08/2010 [place] 05/08/2010 1335   LABSPEC 1.009 05/08/2010 1335   PHURINE 6.0 05/08/2010 1335   GLUCOSEU NEGATIVE 05/08/2010 1335   HGBUR NEGATIVE 05/08/2010 1335   BILIRUBINUR NEGATIVE 05/08/2010 1335   KETONESUR  NEGATIVE 05/08/2010 1335   PROTEINUR 100 (A) 05/08/2010 1335   UROBILINOGEN 0.2 05/08/2010 1335   NITRITE NEGATIVE 05/08/2010 1335   LEUKOCYTESUR SMALL (A) 05/08/2010 1335    Radiological Exams on Admission: Ct Abdomen Pelvis W Contrast  Result Date: 12/24/2018 CLINICAL DATA:  Nausea and vomiting. Dialysis patient. Slight shortness of breath EXAM: CT ABDOMEN AND PELVIS WITH CONTRAST TECHNIQUE: Multidetector CT imaging of the abdomen and pelvis was performed using the standard protocol following bolus administration of intravenous contrast. CONTRAST:  146m OMNIPAQUE IOHEXOL 300 MG/ML  SOLN COMPARISON:  CT dated 03/31/2018. FINDINGS: Lower chest: No acute abnormality. Hepatobiliary: No focal liver  abnormality is seen. No gallstones, gallbladder wall thickening, or biliary dilatation. Pancreas: Unremarkable. No pancreatic ductal dilatation or surrounding inflammatory changes. Spleen: Normal in size without focal abnormality. Adrenals/Urinary Tract: The bilateral adrenal glands are unremarkable. The native kidneys are atrophic with multiple cysts consistent with a history of end-stage renal disease on hemodialysis. There are no radiopaque obstructing kidney stones. The bladder is decompressed which limits evaluation. Stomach/Bowel: There is some mild wall thickening of the ascending colon and cecum. The appendix is unremarkable. The stomach is unremarkable. There is no evidence of a small-bowel obstruction. Vascular/Lymphatic: Aortic atherosclerosis. No enlarged abdominal or pelvic lymph nodes. The abdominal aorta is ectatic measuring up to approximately 2.7 cm in diameter. The patient is status post placement of a bypass graft for the left lower extremity. The proximal graft appears patent. The proximal SFA on the right is occluded. The right profunda femoris is patent. There appears to be some moderate narrowing at the origin of the celiac axis. Reproductive: The prostate gland is mildly enlarged. There are  multiple pockets of gas at the base of the scrotum. These are favored to be external to patient but should be correlated with physical exam and patient's symptoms. Other: No abdominal wall hernia or abnormality. No abdominopelvic ascites. Musculoskeletal: No fracture is seen. IMPRESSION: 1. There is a short segment of wall thickening involving the ascending colon and cecum. Findings are suspicious for infectious or inflammatory colitis. 2. Normal appendix in the right lower quadrant. 3. Atrophic kidneys with multiple cysts consistent with a history of end-stage renal disease. 4. Multiple small pockets of gas at the base of the scrotum favored to be external to the patient but should be correlated with the patient's history and physical exam to help exclude a Fournier gangrene. 5. Advanced atherosclerotic changes of the abdominal aorta. The abdominal aorta is ectatic. Consider a 5 year follow-up with ultrasound to confirm stability. 6. Patent left lower extremity bypass graft, the distal portion is not visualized. The right superficial femoral artery is occluded proximally, likely a chronic finding. Aortic Atherosclerosis (ICD10-I70.0). Electronically Signed   By: Constance Holster M.D.   On: 12/24/2018 20:04   Dg Chest Port 1 View  Result Date: 12/24/2018 CLINICAL DATA:  Hypoglycemia. EXAM: PORTABLE CHEST 1 VIEW COMPARISON:  Radiograph 09/11/2018. Chest CT 08/10/2018 FINDINGS: Mild cardiomegaly with improvement from prior exam. Aortic atherosclerosis. No pulmonary edema, focal airspace disease, pleural effusion or pneumothorax. Nodular density projecting over the right lower lung zone is stable from prior exams and likely represents nipple shadow. IMPRESSION: Mild cardiomegaly, improved from March 2020 radiographs. No acute cardiopulmonary findings. Electronically Signed   By: Keith Rake M.D.   On: 12/24/2018 19:17    EKG: Independently reviewed. Sinus rhythm with LVH, not significantly changed from  prior.  Assessment/Plan Principal Problem:   Gastroenteritis Active Problems:   HYPERTENSION, BENIGN SYSTEMIC   Hypoglycemia   COPD (chronic obstructive pulmonary disease) (HCC)   End-stage renal disease on hemodialysis (HCC)   PAF (paroxysmal atrial fibrillation) (HCC)   Anemia due to chronic kidney disease, on chronic dialysis ([place])   Substance use disorder  Raymond [name] is a 63 y.o. 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  Gastroenteritis: CT imaging showing likely infectious colitis at the ascending colon and cecum.  Has not had further diarrhea since arriving to the ED.  He remains dehydrated. -Give additional 500 mLs NS, monitor strict I/O's -Continue antiemetics as needed -Advance diet as tolerated -Would check for C. difficile if has recurrent diarrhea  Hypoglycemia: Questionable lab error on CBG obtained in ED, although at risk given poor oral intake the last 2 days.  He is not on any hypoglycemic agents. -Monitor CBGs q2h overnight and treat with D50 as needed for hypoglycemia  ESRD on TTS HD: Currently no indication for emergent dialysis.  Appears volume depleted on admission.  He will be due for dialysis next on 12/25/2018. -Nephrology consult in a.m. for usual dialysis  Hypertension: -Hold home Toprol-XL and amlodipine due to hypotension on arrival  Paroxysmal atrial fibrillation: He is in sinus rhythm on admission.  He is not on anticoagulation due to GI bleed in February 2020. -Continue amiodarone -Holding Toprol-XL for now, restart as able  Anemia of chronic disease: Hemoglobin increased compared to prior, likely some component of hemoconcentration.  No obvious bleeding, continue to monitor.  Tobacco  use: -Counseled on smoking cessation  Alcohol use: Reports intermittent alcohol use.  Monitor for withdrawal.  Substance use: Reports occasional cocaine use.  Patient is counseled on substance use cessation.  DVT prophylaxis: Subcutaneous heparin Code Status: Full code, confirmed with patient Family Communication: None available on admission Disposition Plan: Likely discharge to home pending maintenance of adequate oral intake Consults called: None Admission status: Inpatient, patient likely requires greater than 2 midnight length stay for management of infectious gastroenteritis due to tenuous volume status, hyperglycemia, hypotension and need for continued hemodialysis as he is high risk for decompensation given his chronic comorbidities.   Zada Finders MD [facility]  If 7PM-7AM, please contact night-coverage [email or web address]  12/24/2018, 8:58 PM

## 2018-12-24 NOTE — ED Triage Notes (Signed)
Pt sent by [facility] for n/v, is dialysis pt. Denies any sob, is c/o slight abdominal pain

## 2018-12-24 NOTE — ED Notes (Signed)
Spoke to Dr. Posey Pronto regarding episode with pt.

## 2018-12-25 DIAGNOSIS — I480 Paroxysmal atrial fibrillation: Secondary | ICD-10-CM

## 2018-12-25 DIAGNOSIS — Z992 Dependence on renal dialysis: Secondary | ICD-10-CM

## 2018-12-25 DIAGNOSIS — I10 Essential (primary) hypertension: Secondary | ICD-10-CM

## 2018-12-25 DIAGNOSIS — N186 End stage renal disease: Secondary | ICD-10-CM

## 2018-12-25 DIAGNOSIS — D631 Anemia in chronic kidney disease: Secondary | ICD-10-CM

## 2018-12-25 LAB — CBC
HCT: 29.3 % — ABNORMAL LOW (ref 39.0–52.0)
HCT: 27.9 % — ABNORMAL LOW (ref 39.0–52.0)
Hemoglobin: 9.4 g/dL — ABNORMAL LOW (ref 13.0–17.0)
Hemoglobin: 8.9 g/dL — ABNORMAL LOW (ref 13.0–17.0)
MCH: 33.3 pg (ref 26.0–34.0)
MCH: 33.1 pg (ref 26.0–34.0)
MCHC: 32.1 g/dL (ref 30.0–36.0)
MCHC: 31.9 g/dL (ref 30.0–36.0)
MCV: 103.9 fL — ABNORMAL HIGH (ref 80.0–100.0)
MCV: 103.7 fL — ABNORMAL HIGH (ref 80.0–100.0)
Platelets: 129 10*3/uL — ABNORMAL LOW (ref 150–400)
Platelets: 135 10*3/uL — ABNORMAL LOW (ref 150–400)
RBC: 2.82 MIL/uL — ABNORMAL LOW (ref 4.22–5.81)
RBC: 2.69 MIL/uL — ABNORMAL LOW (ref 4.22–5.81)
RDW: 15.8 % — ABNORMAL HIGH (ref 11.5–15.5)
RDW: 15.8 % — ABNORMAL HIGH (ref 11.5–15.5)
WBC: 3.8 10*3/uL — ABNORMAL LOW (ref 4.0–10.5)
WBC: 3.9 10*3/uL — ABNORMAL LOW (ref 4.0–10.5)
nRBC: 0 % (ref 0.0–0.2)
nRBC: 0 % (ref 0.0–0.2)

## 2018-12-25 LAB — MRSA PCR SCREENING: MRSA by PCR: NEGATIVE

## 2018-12-25 LAB — GLUCOSE, CAPILLARY
Glucose-Capillary: 79 mg/dL (ref 70–99)
Glucose-Capillary: 76 mg/dL (ref 70–99)
Glucose-Capillary: 84 mg/dL (ref 70–99)
Glucose-Capillary: 98 mg/dL (ref 70–99)
Glucose-Capillary: 71 mg/dL (ref 70–99)
Glucose-Capillary: 102 mg/dL — ABNORMAL HIGH (ref 70–99)
Glucose-Capillary: 107 mg/dL — ABNORMAL HIGH (ref 70–99)

## 2018-12-25 LAB — RENAL FUNCTION PANEL
Albumin: 3.1 g/dL — ABNORMAL LOW (ref 3.5–5.0)
Anion gap: 15 (ref 5–15)
BUN: 59 mg/dL — ABNORMAL HIGH (ref 8–23)
CO2: 24 mmol/L (ref 22–32)
Calcium: 8.1 mg/dL — ABNORMAL LOW (ref 8.9–10.3)
Chloride: 93 mmol/L — ABNORMAL LOW (ref 98–111)
Creatinine, Ser: 14.9 mg/dL — ABNORMAL HIGH (ref 0.61–1.24)
GFR calc Af Amer: 3 mL/min — ABNORMAL LOW (ref >60)
GFR calc non Af Amer: 3 mL/min — ABNORMAL LOW (ref >60)
Glucose, Bld: 83 mg/dL (ref 70–99)
Phosphorus: 5.9 mg/dL — ABNORMAL HIGH (ref 2.5–4.6)
Potassium: 5.7 mmol/L — ABNORMAL HIGH (ref 3.5–5.1)
Sodium: 132 mmol/L — ABNORMAL LOW (ref 135–145)

## 2018-12-25 MED ORDER — SEVELAMER CARBONATE 800 MG PO TABS
3200.0000 mg | ORAL_TABLET | Freq: Three times a day (TID) | ORAL | Status: DC
  Administered 2018-12-25 – 2018-12-27 (×7): 3200 mg via ORAL
  Filled 2018-12-25 (×7): qty 4

## 2018-12-25 MED ORDER — SODIUM CHLORIDE 0.9 % IV SOLN: 100.0000 mL | INTRAVENOUS | Status: DC | PRN

## 2018-12-25 MED ORDER — LIDOCAINE HCL (PF) 1 % IJ SOLN: 5.0000 mL | INTRAMUSCULAR | Status: DC | PRN

## 2018-12-25 MED ORDER — SODIUM CHLORIDE 0.9 % IV BOLUS
250.0000 mL | Freq: Once | INTRAVENOUS | Status: AC
  Administered 2018-12-25: 250 mL via INTRAVENOUS

## 2018-12-25 MED ORDER — HEPARIN SODIUM (PORCINE) 1000 UNIT/ML DIALYSIS: 1000.0000 [IU] | INTRAMUSCULAR | Status: DC | PRN

## 2018-12-25 MED ORDER — CALCITRIOL 0.5 MCG PO CAPS
1.5000 ug | ORAL_CAPSULE | ORAL | Status: DC
  Administered 2018-12-25 – 2018-12-27 (×2): 1.5 ug via ORAL
  Filled 2018-12-25: qty 3

## 2018-12-25 MED ORDER — PENTAFLUOROPROP-TETRAFLUOROETH EX AERO: 1.0000 "application " | INHALATION_SPRAY | CUTANEOUS | Status: DC | PRN

## 2018-12-25 MED ORDER — ALTEPLASE 2 MG IJ SOLR: 2.0000 mg | Freq: Once | INTRAMUSCULAR | Status: DC | PRN

## 2018-12-25 MED ORDER — LIDOCAINE-PRILOCAINE 2.5-2.5 % EX CREA: 1.0000 "application " | TOPICAL_CREAM | CUTANEOUS | Status: DC | PRN

## 2018-12-25 MED ORDER — RENA-VITE PO TABS
1.0000 | ORAL_TABLET | Freq: Every day | ORAL | Status: DC
  Administered 2018-12-25 – 2018-12-26 (×2): 1 via ORAL
  Filled 2018-12-25 (×2): qty 1

## 2018-12-25 MED ORDER — CHLORHEXIDINE GLUCONATE CLOTH 2 % EX PADS: 6.0000 | MEDICATED_PAD | Freq: Every day | CUTANEOUS | Status: DC

## 2018-12-25 NOTE — Progress Notes (Signed)
NS 250 cc bolus finished. BP 77/51 T 99.4 HR sustaining in the 130's,patient with no c/o. K. Schorr,NP text paged. Order received in epic. Will continue to monitor. Jaimy Kliethermes, Wonda Cheng, Therapist, sports

## 2018-12-25 NOTE — Progress Notes (Signed)
Renal Navigator notified OP HD clinic/[facility] of patient's admission and negative COVID 19 rapid test result in order to provide continuity of care and safety.  Akeira Lahm Elizabeth, LCSW Renal Navigator [phone] 

## 2018-12-25 NOTE — Consult Note (Addendum)
[facility] Renal Consultation Note    Indication for Consultation:  Management of ESRD/hemodialysis; anemia, hypertension/volume and secondary hyperparathyroidism  HPI: Raymond [name] is a 63 y.o. male with ESRD on HD TTS [place]. PMH: HTN, Hep C, CAD, PVD, PAFib (not on anticoagulation d/t prior GI bleeding).   Admitted with gastroenteritis. Presented to ED after 2 days of nausea/vomiting/diarrhea. Unable to tolerate any PO. CXR clear. CT Abd showing wall thickening of the ascending colon and cecum. Labs: Na 132, K 5.7, Ca 8.1 Phos 5.9, WBC 3.8, Hgb 9.4. Rapid SARS-CoV-2 test negative.   Hypotensive this am -received 500 mL fluid bolus in the ED yesterday. Seen and examined in room. Afebrile. Feels much better today. Was able to eat entire breakfast. No further N/V/D episodes this morning. Denies fevers, chills, cough, CP, SOB.   Due for routine dialysis today. Dialyzing via L arm AVF. Compliant with dialysis Rx as able.  Working to get more stable housing.   Past Medical History:  Diagnosis Date  . Anemia   . CAD (coronary artery disease)    a. 03/2018: cath showing 20 to 30% stenosis along the LAD, luminal irregularities along the RCA, and angiographically normal LCx  . COPD (chronic obstructive pulmonary disease) ([place])   . Dyspnea    "with too much fluid"  . ESRD (end stage renal disease) on dialysis [facility])    "MWF; Jeneen Rinks" (07/22/17)  . Hemodialysis patient ([place])   . Hepatitis C    Still positive s/p liver biopsy at [facility]  and interferon therapy for 6 months. Most recent lab work was on 10/24/12  . Hepatitis C    "took the tx; gone now" (12/05/2016)  Was treated  . History of blood transfusion ~ 2012/2013   "related to my kidneys; blood was low"  . Hypertension   . Substance abuse ([place])   . Thyroid disease    Past Surgical History:  Procedure Laterality Date  . ABDOMINAL AORTOGRAM W/LOWER EXTREMITY N/A 02/08/2018   Procedure: ABDOMINAL  AORTOGRAM W/LOWER EXTREMITY;  Surgeon: Conrad [place]ville, MD;  Location: [place] [facility];  Service: Cardiovascular;  Laterality: N/A;  . AV FISTULA PLACEMENT Left Aug. 2013 ?  . COLONOSCOPY WITH PROPOFOL N/A 08/21/2018   Procedure: COLONOSCOPY WITH PROPOFOL;  Surgeon: Yetta Flock, MD;  Location: [place];  Service: Gastroenterology;  Laterality: N/A;  . ESOPHAGOGASTRODUODENOSCOPY (EGD) WITH PROPOFOL N/A 08/20/2018   Procedure: ESOPHAGOGASTRODUODENOSCOPY (EGD) WITH PROPOFOL;  Surgeon: Gatha Mayer, MD;  Location: [place];  Service: Endoscopy;  Laterality: N/A;  . FEMORAL-POPLITEAL BYPASS GRAFT Left 04/11/2018   Procedure: BYPASS GRAFT FEMORAL-POPLITEAL ARTERY LEFT LEG;  Surgeon: Rosetta Posner, MD;  Location: [place];  Service: Vascular;  Laterality: Left;  . HOT HEMOSTASIS N/A 08/20/2018   Procedure: HOT HEMOSTASIS (ARGON PLASMA COAGULATION/BICAP);  Surgeon: Gatha Mayer, MD;  Location[facility] ENDOSCOPY;  Service: Endoscopy;  Laterality: N/A;  . LEFT HEART CATH AND CORONARY ANGIOGRAPHY N/A 03/29/2018   Procedure: LEFT HEART CATH AND CORONARY ANGIOGRAPHY;  Surgeon: Nelva Bush, MD;  Location: [place] [facility];  Service: Cardiovascular;  Laterality: N/A;  . LIVER BIOPSY    . ORIF ULNAR FRACTURE Left 05/18/2017   Procedure: OPEN REDUCTION INTERNAL FIXATION (ORIF) ULNAR FRACTURE;  Surgeon: Iran Planas, MD;  Location: [place];  Service: Orthopedics;  Laterality: Left;  . POLYPECTOMY  08/21/2018   Procedure: POLYPECTOMY;  Surgeon: Yetta Flock, MD;  Location: [facility] ENDOSCOPY;  Service: Gastroenterology;;  . SHUNTOGRAM  N/A 12/11/2012   Procedure: [place] Mallet;  Surgeon: Serafina Mitchell, MD;  Location: [facility] CATH LAB;  Service: Cardiovascular;  Laterality: N/A;   Family History  Problem Relation Age of Onset  . Diabetes Father   . Hypertension Father   . Heart disease Father    Social History:  reports that he has been smoking cigarettes. He has a 25.00 pack-year smoking history. He  has never used smokeless tobacco. He reports current alcohol use. He reports current drug use. Drug: Cocaine. No Known Allergies Prior to Admission medications   Medication Sig Start Date End Date Taking? Authorizing Provider  amiodarone (PACERONE) 200 MG tablet Take 1 tablet (200 mg total) by mouth daily. 09/14/18  Yes Vasireddy, Grier Mitts, MD  amLODipine (NORVASC) 2.5 MG tablet Take 1 tablet (2.5 mg total) by mouth daily. 09/14/18  Yes Vasireddy, Grier Mitts, MD  bismuth subsalicylate (PEPTO BISMOL) 262 MG chewable tablet Chew 524 mg by mouth as needed for indigestion or diarrhea or loose stools.   Yes [provider]  calcitRIOL (ROCALTROL) 0.5 MCG capsule Take 3 capsules (1.5 mcg total) by mouth 3 (three) times a week. 09/17/18  Yes Vasireddy, Grier Mitts, MD  metoprolol succinate (TOPROL-XL) 50 MG 24 hr tablet Take 1 tablet (50 mg total) by mouth daily for 30 days. Take with or immediately following a meal. 09/14/18 12/24/18 Yes Vasireddy, Grier Mitts, MD  sevelamer carbonate (RENVELA) 800 MG tablet Take 4 tablets (3,200 mg total) by mouth 3 (three) times daily with meals. Patient taking differently: Take 4,800 mg by mouth 3 (three) times daily with meals. Six tablets with each meal 09/14/18  Yes Vasireddy, Grier Mitts, MD  doxycycline (VIBRAMYCIN) 50 MG capsule Take 2 capsules (100 mg total) by mouth 2 (two) times daily. Patient not taking: Reported on 12/24/2018 09/14/18   Monica Becton, MD  predniSONE (DELTASONE) 10 MG tablet Take 2 tablets (20 mg total) by mouth 2 (two) times daily with a meal. Patient not taking: Reported on 12/24/2018 09/14/18   Monica Becton, MD   Current Facility-Administered Medications  Medication Dose Route Frequency Provider Last Rate Last Dose  . acetaminophen (TYLENOL) tablet 650 mg  650 mg Oral Q6H PRN Lenore Cordia, MD       Or  . acetaminophen (TYLENOL) suppository 650 mg  650 mg Rectal Q6H PRN Lenore Cordia, MD      . amiodarone (PACERONE) tablet 200 mg  200 mg Oral  Daily Zada Finders R, MD      . Chlorhexidine Gluconate Cloth 2 % PADS 6 each  6 each Topical Q0600 Lynnda Child, PA-C      . dextrose 50 % solution 50 mL  1 ampule Intravenous PRN Zada Finders R, MD      . heparin injection 5,000 Units  5,000 Units Subcutaneous Q8H Lenore Cordia, MD   5,000 Units at 12/25/18 0543  . ondansetron (ZOFRAN) tablet 4 mg  4 mg Oral Q6H PRN Lenore Cordia, MD       Or  . ondansetron (ZOFRAN) injection 4 mg  4 mg Intravenous Q6H PRN Lenore Cordia, MD       Facility-Administered Medications Ordered in Other Encounters  Medication Dose Route Frequency Provider Last Rate Last Dose  . midazolam (VERSED) 5 MG/5ML injection    Anesthesia Intra-op Sammie Bench, CRNA   2 mg at 04/11/18 1042     ROS: As per HPI otherwise negative.  Physical Exam: Vitals:   12/24/18 2301 12/24/18 2311 12/25/18 0350 12/25/18  0943  BP: 109/74  93/67 (!) 89/62  Pulse: 70  79 78  Resp: 17  16 18   Temp: 98.3 F (36.8 C)  98.5 F (36.9 C) 98.3 F (36.8 C)  TempSrc: Oral  Oral Oral  SpO2: 97%  100% 99%  Weight: 69.8 kg     Height: 5\' 8"  (1.727 m) 5\' 8"  (1.727 m)       General: WNWD male NAD  Head: NCAT sclera not icteric Neck: Supple. No JVD  Lungs: CTA bilaterally without wheezes, rales, or rhonchi. Breathing is unlabored. Heart: RRR with S1 S2 Abdomen: soft NT + BS Lower extremities:without edema or ischemic changes, no open wounds  Neuro: A & O  X 3. Moves all extremities spontaneously. Psych:  Responds to questions appropriately with a normal affect. Dialysis Access: LUE AVF aneurysmal +bruit   Labs: Basic Metabolic Panel: Recent Labs  Lab 12/24/18 1318 12/25/18 0627  NA 136 132*  K 4.8 5.7*  CL 92* 93*  CO2 26 24  GLUCOSE 95 83  BUN 56* 59*  CREATININE 14.05* 14.90*  CALCIUM 9.0 8.1*  PHOS  --  5.9*   Liver Function Tests: Recent Labs  Lab 12/24/18 1318 12/25/18 0627  AST 15  --   ALT 12  --   ALKPHOS 57  --   BILITOT 0.5  --    PROT 8.7*  --   ALBUMIN 4.0 3.1*   Recent Labs  Lab 12/24/18 1318  LIPASE 54*   No results for input(s): AMMONIA in the last 168 hours. CBC: Recent Labs  Lab 12/24/18 1318 12/25/18 0627  WBC 4.6 3.8*  NEUTROABS 3.3  --   HGB 11.6* 9.4*  HCT 37.4* 29.3*  MCV 105.4* 103.9*  PLT 152 129*   Cardiac Enzymes: No results for input(s): CKTOTAL, CKMB, CKMBINDEX, TROPONINI in the last 168 hours. CBG: Recent Labs  Lab 12/25/18 0100 12/25/18 0345 12/25/18 0607 12/25/18 0804 12/25/18 1011  GLUCAP 79 76 84 98 71   Iron Studies: No results for input(s): IRON, TIBC, TRANSFERRIN, FERRITIN in the last 72 hours. Studies/Results: Ct Abdomen Pelvis W Contrast  Result Date: 12/24/2018 CLINICAL DATA:  Nausea and vomiting. Dialysis patient. Slight shortness of breath EXAM: CT ABDOMEN AND PELVIS WITH CONTRAST TECHNIQUE: Multidetector CT imaging of the abdomen and pelvis was performed using the standard protocol following bolus administration of intravenous contrast. CONTRAST:  1100mL OMNIPAQUE IOHEXOL 300 MG/ML  SOLN COMPARISON:  CT dated 03/31/2018. FINDINGS: Lower chest: No acute abnormality. Hepatobiliary: No focal liver abnormality is seen. No gallstones, gallbladder wall thickening, or biliary dilatation. Pancreas: Unremarkable. No pancreatic ductal dilatation or surrounding inflammatory changes. Spleen: Normal in size without focal abnormality. Adrenals/Urinary Tract: The bilateral adrenal glands are unremarkable. The native kidneys are atrophic with multiple cysts consistent with a history of end-stage renal disease on hemodialysis. There are no radiopaque obstructing kidney stones. The bladder is decompressed which limits evaluation. Stomach/Bowel: There is some mild wall thickening of the ascending colon and cecum. The appendix is unremarkable. The stomach is unremarkable. There is no evidence of a small-bowel obstruction. Vascular/Lymphatic: Aortic atherosclerosis. No enlarged abdominal or  pelvic lymph nodes. The abdominal aorta is ectatic measuring up to approximately 2.7 cm in diameter. The patient is status post placement of a bypass graft for the left lower extremity. The proximal graft appears patent. The proximal SFA on the right is occluded. The right profunda femoris is patent. There appears to be some moderate narrowing at the origin of the celiac axis.  Reproductive: The prostate gland is mildly enlarged. There are multiple pockets of gas at the base of the scrotum. These are favored to be external to patient but should be correlated with physical exam and patient's symptoms. Other: No abdominal wall hernia or abnormality. No abdominopelvic ascites. Musculoskeletal: No fracture is seen. IMPRESSION: 1. There is a short segment of wall thickening involving the ascending colon and cecum. Findings are suspicious for infectious or inflammatory colitis. 2. Normal appendix in the right lower quadrant. 3. Atrophic kidneys with multiple cysts consistent with a history of end-stage renal disease. 4. Multiple small pockets of gas at the base of the scrotum favored to be external to the patient but should be correlated with the patient's history and physical exam to help exclude a Fournier gangrene. 5. Advanced atherosclerotic changes of the abdominal aorta. The abdominal aorta is ectatic. Consider a 5 year follow-up with ultrasound to confirm stability. 6. Patent left lower extremity bypass graft, the distal portion is not visualized. The right superficial femoral artery is occluded proximally, likely a chronic finding. Aortic Atherosclerosis (ICD10-I70.0). Electronically Signed   By: Constance Holster M.D.   On: 12/24/2018 20:04   Dg Chest Port 1 View  Result Date: 12/24/2018 CLINICAL DATA:  Hypoglycemia. EXAM: PORTABLE CHEST 1 VIEW COMPARISON:  Radiograph 09/11/2018. Chest CT 08/10/2018 FINDINGS: Mild cardiomegaly with improvement from prior exam. Aortic atherosclerosis. No pulmonary edema, focal  airspace disease, pleural effusion or pneumothorax. Nodular density projecting over the right lower lung zone is stable from prior exams and likely represents nipple shadow. IMPRESSION: Mild cardiomegaly, improved from March 2020 radiographs. No acute cardiopulmonary findings. Electronically Signed   By: Keith Rake M.D.   On: 12/24/2018 19:17    Dialysis Orders:  [facility] TTS 4h 450/800 EDW 70.5kg 2K/2.5Ca UF Profile 4 L AVF No heparin  Parsabiv 5mg  TIW Venofer 50 mg  q week Mircera 200 q 2 weeks (last 6/11)  Calcitriol 1.50 mcg PO TIW   Assessment/Plan: 1. N/VD - Likely infectious gastroenteritis. Improved today. Tolerating PO. Per primary  2. ESRD -  TTS. HD today on schedule.  3. Hypertension/volume  - BP soft this admission. Holding BP meds. No volume on exam. Below EDW by weights here. Keep even on HD today.  4. Anemia  - Hgb 11.6>9.4. ESA recently dosed as outpatient  5. Metabolic bone disease -  Continue VDRA/binders.  6. P Afib - rate controlled. On amiodarone/metoprolol  7. Nutrition - Renal diet/vitamins  Lynnda Child PA-C [facility] Pager [phone] 12/25/2018, 11:12 AM

## 2018-12-25 NOTE — Progress Notes (Signed)
PROGRESS NOTE    Raymond [name]  EUM:[id number] DOB: February 17, 1956 DOA: 12/24/2018 PCP: [name], FNP   Brief Narrative: Raymond [name] is a 63 y.o. 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 He presents secondary to nausea/vomiting and diarrhea and found to have colitis of the ascending colon. BP also low with evidence of dehydration.   Assessment & Plan:   Principal Problem:   Gastroenteritis Active Problems:   HYPERTENSION, BENIGN SYSTEMIC   Hypoglycemia   End-stage renal disease on hemodialysis (HCC)   PAF (paroxysmal atrial fibrillation) (HCC)   Anemia due to chronic kidney disease, on chronic dialysis ([place])   Substance use disorder   Gastroenteritis CT abdomen significant for right sided colonic inflammation. Afebrile and no leukocytosis. Symptoms of nausea, vomiting and diarrhea resolved.  Hypotension Dehydration In setting of above. Antihypertensives held. Given IV fluid. Will need to watch carefully in setting of ESRD. Still with low blood pressure today, but stable. Systolic BP of 89 mmHg this morning -Watch BP overnight -Continue to hold antihypertensives  ESRD on HD -Nephrology consulted  Essential hypertension -Hold metoprolol and amlodipine secondary to above problem  Paroxysmal atrial fibrillation Sinus rhythm. On amiodarone and metoprolol as an outpatient -Continue amiodarone -metoprolol as mentioned above; would restart as soon as able to prevent rebound tachycardia  Anemia of chronic disease At baseline.   DVT prophylaxis: Heparin subq Code Status:   Code Status: Full Code Family Communication: None Disposition Plan: Discharge likely in 24 hours if BP improves and pending nephrology recommendations for fluid  balance   Consultants:   Nephrology  Procedures:   HD  Antimicrobials:  None    Subjective: Diarrhea and vomiting resolved. No other issues.  Objective: Vitals:   12/25/18 1140 12/25/18 1145 12/25/18 1230 12/25/18 1300  BP: 106/70 110/72 92/62 108/71  Pulse: 65 66 75 (!) 158  Resp:      Temp:      TempSrc:      SpO2:      Weight:      Height:        Intake/Output Summary (Last 24 hours) at 12/25/2018 1409 Last data filed at 12/25/2018 0700 Gross per 24 hour  Intake 420 ml  Output 0 ml  Net 420 ml   Filed Weights   12/24/18 2301 12/25/18 1135  Weight: 69.8 kg 70.6 kg    Examination:  General exam: Appears calm and comfortable Respiratory system: Clear to auscultation. Respiratory effort normal. Cardiovascular system: S1 & S2 heard, RRR. No murmurs, rubs, gallops or clicks. Gastrointestinal system: Abdomen is nondistended, soft and nontender. No organomegaly or masses felt. Normal bowel sounds heard. Central nervous system: Alert and oriented. No focal neurological deficits. Extremities: No edema. No calf tenderness Skin: No cyanosis. No rashes Psychiatry: Judgement and insight appear normal. Mood & affect appropriate.     Data Reviewed: I have personally reviewed following labs and imaging studies  CBC: Recent Labs  Lab 12/24/18 1318 12/25/18 0627  WBC 4.6 3.8*  NEUTROABS 3.3  --   HGB 11.6* 9.4*  HCT 37.4* 29.3*  MCV 105.4* 103.9*  PLT 152 540*   Basic Metabolic Panel: Recent Labs  Lab 12/24/18 1318 12/25/18 0627  NA 136 132*  K 4.8 5.7*  CL 92* 93*  CO2 26 24  GLUCOSE  95 83  BUN 56* 59*  CREATININE 14.05* 14.90*  CALCIUM 9.0 8.1*  PHOS  --  5.9*   GFR: Estimated Creatinine Clearance: 4.8 mL/min (A) (by C-G formula based on SCr of 14.9 mg/dL (H)). Liver Function Tests: Recent Labs  Lab 12/24/18 1318 12/25/18 0627  AST 15  --   ALT 12  --   ALKPHOS 57  --   BILITOT 0.5  --   PROT 8.7*  --   ALBUMIN 4.0 3.1*   Recent Labs   Lab 12/24/18 1318  LIPASE 54*   No results for input(s): AMMONIA in the last 168 hours. Coagulation Profile: No results for input(s): INR, PROTIME in the last 168 hours. Cardiac Enzymes: No results for input(s): CKTOTAL, CKMB, CKMBINDEX, TROPONINI in the last 168 hours. BNP (last 3 results) No results for input(s): PROBNP in the last 8760 hours. HbA1C: No results for input(s): HGBA1C in the last 72 hours. CBG: Recent Labs  Lab 12/25/18 0100 12/25/18 0345 12/25/18 0607 12/25/18 0804 12/25/18 1011  GLUCAP 79 76 84 98 71   Lipid Profile: No results for input(s): CHOL, HDL, LDLCALC, TRIG, CHOLHDL, LDLDIRECT in the last 72 hours. Thyroid Function Tests: No results for input(s): TSH, T4TOTAL, FREET4, T3FREE, THYROIDAB in the last 72 hours. Anemia Panel: No results for input(s): VITAMINB12, FOLATE, FERRITIN, TIBC, IRON, RETICCTPCT in the last 72 hours. Sepsis Labs: No results for input(s): PROCALCITON, LATICACIDVEN in the last 168 hours.  Recent Results (from the past 240 hour(s))  Culture, blood (Routine X 2) w Reflex to ID Panel     Status: None (Preliminary result)   Collection Time: 12/24/18  7:05 PM   Specimen: BLOOD  Result Value Ref Range Status   Specimen Description BLOOD BLOOD RIGHT FOREARM  Final   Special Requests   Final    BOTTLES DRAWN AEROBIC AND ANAEROBIC Blood Culture adequate volume   Culture   Final    NO GROWTH < 24 HOURS Performed at [facility], [place] [address] [place], [place] [address]    Report Status PENDING  Incomplete  Culture, blood (Routine X 2) w Reflex to ID Panel     Status: None (Preliminary result)   Collection Time: 12/24/18  7:12 PM   Specimen: BLOOD RIGHT HAND  Result Value Ref Range Status   Specimen Description BLOOD RIGHT HAND  Final   Special Requests   Final    BOTTLES DRAWN AEROBIC ONLY Blood Culture results may not be optimal due to an inadequate volume of blood received in culture bottles   Culture   Final    NO  GROWTH < 24 HOURS Performed at [facility], [place] [address], [place], [place] [address]    Report Status PENDING  Incomplete  SARS Coronavirus 2     Status: None   Collection Time: 12/24/18  8:00 PM  Result Value Ref Range Status   SARS Coronavirus 2 NOT DETECTED NOT DETECTED Final    Comment: (NOTE) SARS-CoV-2 target nucleic acids are NOT DETECTED. The SARS-CoV-2 RNA is generally detectable in upper and lower respiratory specimens during the acute phase of infection.  Negative  results do not preclude SARS-CoV-2 infection, do not rule out co-infections with other pathogens, and should not be used as the sole basis for treatment or other patient management decisions.  Negative results must be combined with clinical observations, patient history, and epidemiological information. The expected result is Not Detected. Fact Sheet for Patients: http://www.biofiredefense.com/wp-content/uploads/2020/03/BIOFIRE-COVID -19-patients.pdf Fact Sheet for  Healthcare Providers: http://www.biofiredefense.com/wp-content/uploads/2020/03/BIOFIRE-COVID -19-hcp.pdf This test is not yet approved or cleared by the [place] and  has been authorized for detection and/or diagnosis of SARS-CoV-2 by FDA under an Emergency Use Authorization (EUA).  This EUA will remain in effec t (meaning this test can be used) for the duration of  the COVID-19 declaration under Section 564(b)(1) of the Act, 21 U.S.C. section 360bbb-3(b)(1), unless the authorization is terminated or revoked sooner. Performed at [facility], [place] [address], [place], [place] [address]   MRSA PCR Screening     Status: None   Collection Time: 12/25/18  7:24 AM   Specimen: Nasal Mucosa; Nasopharyngeal  Result Value Ref Range Status   MRSA by PCR NEGATIVE NEGATIVE Final    Comment:        The GeneXpert MRSA Assay (FDA approved for NASAL specimens only), is one component of a comprehensive MRSA colonization surveillance  program. It is not intended to diagnose MRSA infection nor to guide or monitor treatment for MRSA infections. Performed at [facility], [place] [address] [place], [place] [address]          Radiology Studies: Ct Abdomen Pelvis W Contrast  Result Date: 12/24/2018 CLINICAL DATA:  Nausea and vomiting. Dialysis patient. Slight shortness of breath EXAM: CT ABDOMEN AND PELVIS WITH CONTRAST TECHNIQUE: Multidetector CT imaging of the abdomen and pelvis was performed using the standard protocol following bolus administration of intravenous contrast. CONTRAST:  129mL OMNIPAQUE IOHEXOL 300 MG/ML  SOLN COMPARISON:  CT dated 03/31/2018. FINDINGS: Lower chest: No acute abnormality. Hepatobiliary: No focal liver abnormality is seen. No gallstones, gallbladder wall thickening, or biliary dilatation. Pancreas: Unremarkable. No pancreatic ductal dilatation or surrounding inflammatory changes. Spleen: Normal in size without focal abnormality. Adrenals/Urinary Tract: The bilateral adrenal glands are unremarkable. The native kidneys are atrophic with multiple cysts consistent with a history of end-stage renal disease on hemodialysis. There are no radiopaque obstructing kidney stones. The bladder is decompressed which limits evaluation. Stomach/Bowel: There is some mild wall thickening of the ascending colon and cecum. The appendix is unremarkable. The stomach is unremarkable. There is no evidence of a small-bowel obstruction. Vascular/Lymphatic: Aortic atherosclerosis. No enlarged abdominal or pelvic lymph nodes. The abdominal aorta is ectatic measuring up to approximately 2.7 cm in diameter. The patient is status post placement of a bypass graft for the left lower extremity. The proximal graft appears patent. The proximal SFA on the right is occluded. The right profunda femoris is patent. There appears to be some moderate narrowing at the origin of the celiac axis. Reproductive: The prostate gland is mildly  enlarged. There are multiple pockets of gas at the base of the scrotum. These are favored to be external to patient but should be correlated with physical exam and patient's symptoms. Other: No abdominal wall hernia or abnormality. No abdominopelvic ascites. Musculoskeletal: No fracture is seen. IMPRESSION: 1. There is a short segment of wall thickening involving the ascending colon and cecum. Findings are suspicious for infectious or inflammatory colitis. 2. Normal appendix in the right lower quadrant. 3. Atrophic kidneys with multiple cysts consistent with a history of end-stage renal disease. 4. Multiple small pockets of gas at the base of the scrotum favored to be external to the patient but should be correlated with the patient's history and physical exam to help exclude a Fournier gangrene. 5. Advanced atherosclerotic changes of the abdominal aorta. The abdominal aorta is ectatic. Consider a 5 year follow-up with ultrasound to confirm stability.  6. Patent left lower extremity bypass graft, the distal portion is not visualized. The right superficial femoral artery is occluded proximally, likely a chronic finding. Aortic Atherosclerosis (ICD10-I70.0). Electronically Signed   By: Constance Holster M.D.   On: 12/24/2018 20:04   Dg Chest Port 1 View  Result Date: 12/24/2018 CLINICAL DATA:  Hypoglycemia. EXAM: PORTABLE CHEST 1 VIEW COMPARISON:  Radiograph 09/11/2018. Chest CT 08/10/2018 FINDINGS: Mild cardiomegaly with improvement from prior exam. Aortic atherosclerosis. No pulmonary edema, focal airspace disease, pleural effusion or pneumothorax. Nodular density projecting over the right lower lung zone is stable from prior exams and likely represents nipple shadow. IMPRESSION: Mild cardiomegaly, improved from March 2020 radiographs. No acute cardiopulmonary findings. Electronically Signed   By: Keith Rake M.D.   On: 12/24/2018 19:17        Scheduled Meds:  amiodarone  200 mg Oral Daily    calcitRIOL  1.5 mcg Oral Q T,Th,Sa-HD   Chlorhexidine Gluconate Cloth  6 each Topical Q0600   heparin  5,000 Units Subcutaneous Q8H   multivitamin  1 tablet Oral QHS   sevelamer carbonate  3,200 mg Oral TID WC   Continuous Infusions:  sodium chloride     sodium chloride       LOS: 1 day     Cordelia Poche, MD [facility] 12/25/2018, 2:09 PM  If 7PM-7AM, please contact night-coverage [email or web address]

## 2018-12-25 NOTE — Plan of Care (Signed)
  Problem: Activity: Goal: Risk for activity intolerance will decrease Outcome: Progressing   

## 2018-12-25 NOTE — Progress Notes (Addendum)
   12/25/18 1740  Provider Notification  Provider Name/Title Dr. Cordelia Poche  Date Provider Notified 12/25/18  Time Provider Notified [phone]  Notification Type Page  Notification Reason Other (Comment) (Pt b/p = 72/48, noted dizziness)   F/U action: MD ordered 250 cc bolus

## 2018-12-25 NOTE — Progress Notes (Signed)
Pt. HR 158 and symptomatic pt states " I'm hot" UF turned off. Pt HR sustained in 130's-140's. Dr. Hollie Salk made aware orders for EKG and to terminate HD tx at this time. Tx stop. Pt stable. Report called to primary nurse. text paged primary MD awaiting return call. Pt. Stable returned to room

## 2018-12-26 LAB — RENAL FUNCTION PANEL
Albumin: 2.6 g/dL — ABNORMAL LOW (ref 3.5–5.0)
Anion gap: 11 (ref 5–15)
BUN: 47 mg/dL — ABNORMAL HIGH (ref 8–23)
CO2: 28 mmol/L (ref 22–32)
Calcium: 8 mg/dL — ABNORMAL LOW (ref 8.9–10.3)
Chloride: 97 mmol/L — ABNORMAL LOW (ref 98–111)
Creatinine, Ser: 12.2 mg/dL — ABNORMAL HIGH (ref 0.61–1.24)
GFR calc Af Amer: 4 mL/min — ABNORMAL LOW (ref >60)
GFR calc non Af Amer: 4 mL/min — ABNORMAL LOW (ref >60)
Glucose, Bld: 88 mg/dL (ref 70–99)
Phosphorus: 4.2 mg/dL (ref 2.5–4.6)
Potassium: 5 mmol/L (ref 3.5–5.1)
Sodium: 136 mmol/L (ref 135–145)

## 2018-12-26 LAB — CBC
HCT: 26.4 % — ABNORMAL LOW (ref 39.0–52.0)
Hemoglobin: 8.2 g/dL — ABNORMAL LOW (ref 13.0–17.0)
MCH: 33.1 pg (ref 26.0–34.0)
MCHC: 31.1 g/dL (ref 30.0–36.0)
MCV: 106.5 fL — ABNORMAL HIGH (ref 80.0–100.0)
Platelets: 131 10*3/uL — ABNORMAL LOW (ref 150–400)
RBC: 2.48 MIL/uL — ABNORMAL LOW (ref 4.22–5.81)
RDW: 15.8 % — ABNORMAL HIGH (ref 11.5–15.5)
WBC: 3.5 10*3/uL — ABNORMAL LOW (ref 4.0–10.5)
nRBC: 0.6 % — ABNORMAL HIGH (ref 0.0–0.2)

## 2018-12-26 LAB — GLUCOSE, CAPILLARY
Glucose-Capillary: 120 mg/dL — ABNORMAL HIGH (ref 70–99)
Glucose-Capillary: 155 mg/dL — ABNORMAL HIGH (ref 70–99)
Glucose-Capillary: 76 mg/dL (ref 70–99)
Glucose-Capillary: 81 mg/dL (ref 70–99)
Glucose-Capillary: 95 mg/dL (ref 70–99)
Glucose-Capillary: 96 mg/dL (ref 70–99)

## 2018-12-26 MED ORDER — LIDOCAINE HCL (PF) 1 % IJ SOLN: 5.0000 mL | INTRAMUSCULAR | Status: DC | PRN

## 2018-12-26 MED ORDER — ALTEPLASE 2 MG IJ SOLR: 2.0000 mg | Freq: Once | INTRAMUSCULAR | Status: DC | PRN

## 2018-12-26 MED ORDER — SODIUM CHLORIDE 0.9 % IV SOLN: 100.0000 mL | INTRAVENOUS | Status: DC | PRN

## 2018-12-26 MED ORDER — PENTAFLUOROPROP-TETRAFLUOROETH EX AERO: 1.0000 "application " | INHALATION_SPRAY | CUTANEOUS | Status: DC | PRN

## 2018-12-26 MED ORDER — ZOLPIDEM TARTRATE 5 MG PO TABS
5.0000 mg | ORAL_TABLET | Freq: Every evening | ORAL | Status: DC | PRN
  Administered 2018-12-26: 5 mg via ORAL
  Filled 2018-12-26: qty 1

## 2018-12-26 MED ORDER — LIDOCAINE-PRILOCAINE 2.5-2.5 % EX CREA: 1.0000 "application " | TOPICAL_CREAM | CUTANEOUS | Status: DC | PRN

## 2018-12-26 MED ORDER — HEPARIN SODIUM (PORCINE) 1000 UNIT/ML DIALYSIS: 1000.0000 [IU] | INTRAMUSCULAR | Status: DC | PRN

## 2018-12-26 NOTE — Progress Notes (Signed)
  [facility] Progress Note   Subjective:  HD terminated early yesterday d/t CP/tachycarding during treatment. A. flutter with 1st degree AV block noted on EKG.  No complaints today - Denies  CP, SOB, N/V/D  Objective Vitals:   12/25/18 2204 12/26/18 0124 12/26/18 0410 12/26/18 0920  BP: (!) 82/63 98/60 90/72  112/69  Pulse: (!) 135 96 93 84  Resp:   16 18  Temp:   98.8 F (37.1 C) 98.7 F (37.1 C)  TempSrc:   Oral Oral  SpO2:   96% 100%  Weight:   73 kg   Height:        Physical Exam General: WNWD male NAD  Heart: irregular rhythm   Lungs: CTAB  Abdomen: soft NT/ND Extremities: No LE edema  Dialysis Access: L AVF +bruit    Weight change: 0.792 kg   Additional Objective Labs: Basic Metabolic Panel: Recent Labs  Lab 12/24/18 1318 12/25/18 0627  NA 136 132*  K 4.8 5.7*  CL 92* 93*  CO2 26 24  GLUCOSE 95 83  BUN 56* 59*  CREATININE 14.05* 14.90*  CALCIUM 9.0 8.1*  PHOS  --  5.9*   CBC: Recent Labs  Lab 12/24/18 1318 12/25/18 0627 12/25/18 1519  WBC 4.6 3.8* 3.9*  NEUTROABS 3.3  --   --   HGB 11.6* 9.4* 8.9*  HCT 37.4* 29.3* 27.9*  MCV 105.4* 103.9* 103.7*  PLT 152 129* 135*   Blood Culture    Component Value Date/Time   SDES BLOOD RIGHT HAND 12/24/2018 1912   SPECREQUEST  12/24/2018 1912    BOTTLES DRAWN AEROBIC ONLY Blood Culture results may not be optimal due to an inadequate volume of blood received in culture bottles   CULT  12/24/2018 1912    NO GROWTH < 24 HOURS Performed at [facility], [address], [place], [place] [address]    REPTSTATUS PENDING 12/24/2018 1912     Medications:  . amiodarone  200 mg Oral Daily  . calcitRIOL  1.5 mcg Oral Q T,Th,Sa-HD  . Chlorhexidine Gluconate Cloth  6 each Topical Q0600  . heparin  5,000 Units Subcutaneous Q8H  . multivitamin  1 tablet Oral QHS  . sevelamer carbonate  3,200 mg Oral TID WC    Dialysis Orders:  [facility] TTS 4h 450/800 EDW 70.5kg 2K/2.5Ca UF Profile 4 L AVF  No heparin  Parsabiv 5mg  TIW Venofer 50 mg  q week Mircera 200 q 2 weeks (last dosed 6/11)  Calcitriol 1.50 mcg PO TIW   Assessment/Plan: 1. N/VD - Likely infectious gastroenteritis. Improved today. Tolerating PO. Per primary  2. ESRD -  TTS.  Next HD 6/18  3. Hypertension/volume  - BP soft this admission. Holding BP meds. No volume on exam. At EDW by weights here. Keep even on HD  4. Anemia  - Hgb 11.6>9.4>8.9 . ESA recently dosed as outpatient  5. Metabolic bone disease -  Continue VDRA/binders. Parsabiv not available in hospital  6. P Afib -  Aflutter/AV block on EKG yesterday.  On amiodarone 7. Nutrition - Renal diet/vitamins   Alysha [name] Larina Earthly PA-C [facility] Pager [phone] 12/26/2018,10:27 AM  LOS: 2 days

## 2018-12-26 NOTE — TOC Initial Note (Signed)
Transition of Care [facility]) - Initial/Assessment Note    Patient Details  Name: Raymond [name] MRN: [id number] Date of Birth: [birth year]  [facility] CM/SW Contact:    Bartholomew Crews, RN Phone Number: [phone] 12/26/2018, 12:06 PM  Clinical Narrative:                 Spoke with patient at the bedside. Patient reports that his current living conditions are staying with different friends/family while waiting for a permanent residence through housing authority. He stated that his remote criminal history is presenting a barrier, but he states that he is connected with social workers who are assisting him. Currently uses bus transportation to get to and from hemodialysis, but states that he will access transportation services, like [employer], once he has a permanent address. PCP - [facility] - who provides transportation to and from appointments and have social workers on [employer], clinical. Denies problems obtaining medications. States that he will use the bus transportation when discharged. Patient situation discussed with [facility] who advised that patient PCP is not with [facility], but patient needs are being met, so no [facility]N needs at this time. No transition of care needs identified at this time.   Expected Discharge Plan: Home/Self Care Barriers to Discharge: Continued Medical Work up   Patient Goals and CMS Choice     Choice offered to / list presented to : NA  Expected Discharge Plan and Services Expected Discharge Plan: Home/Self Care In-house Referral: NA Discharge Planning Services: CM Consult Post Acute Care Choice: NA Living arrangements for the past 2 months: No permanent address                 DME Arranged: N/A DME Agency: NA       HH Arranged: NA HH Agency: NA        Prior Living Arrangements/Services Living arrangements for the past 2 months: No permanent address Lives with:: Other (Comment)(currently couch surfing while waiting on housing authority) Patient language  and need for interpreter reviewed:: Yes Do you feel safe going back to the place where you live?: Yes            Criminal Activity/Legal Involvement Pertinent to Current Situation/Hospitalization: No - Comment as needed  Activities of Daily Living Home Assistive Devices/Equipment: None ADL Screening (condition at time of admission) Patient's cognitive ability adequate to safely complete daily activities?: Yes Is the patient deaf or have difficulty hearing?: No Does the patient have difficulty seeing, even when wearing glasses/contacts?: No Does the patient have difficulty concentrating, remembering, or making decisions?: No Patient able to express need for assistance with ADLs?: Yes Does the patient have difficulty dressing or bathing?: No Independently performs ADLs?: Yes (appropriate for developmental age) Does the patient have difficulty walking or climbing stairs?: Yes Weakness of Legs: Both Weakness of Arms/Hands: None  Permission Sought/Granted                  Emotional Assessment Appearance:: Appears stated age Attitude/Demeanor/Rapport: Engaged Affect (typically observed): Accepting Orientation: : Oriented to Self, Oriented to Place, Oriented to  Time, Oriented to Situation      Admission diagnosis:  Hypoglycemia [E16.2] Nausea vomiting and diarrhea [R11.2, R19.7] Hypotension, unspecified hypotension type [I95.9] Gastroenteritis [K52.9] Patient Active Problem List   Diagnosis Date Noted  . Gastroenteritis 12/24/2018  . Substance use disorder 12/24/2018  . Noncompliance of patient with renal dialysis ([place]) 09/12/2018  . Benign neoplasm of colon   . Melena   .  Anemia due to chronic kidney disease, on chronic dialysis ([place])   . Acute on chronic anemia   . H/O medication noncompliance   . Polysubstance (excluding opioids) dependence ([place])   . Atrial flutter with rapid ventricular response ([place]) 08/10/2018  . PAF (paroxysmal atrial fibrillation) ([place])  08/01/2018  . Acute respiratory failure with hypoxia ([place]) 07/13/2018  . Non-compliance with renal dialysis ([place]) 07/13/2018  . PAD (peripheral artery disease) ([place]) 04/11/2018  . Sepsis ([place]) 03/31/2018  . Preop cardiovascular exam 03/29/2018  . Volume overload 08/07/2017  . Acute pulmonary edema ([facility])   . Dyspnea 06/20/2017  . Acute bronchitis   . COPD (chronic obstructive pulmonary disease) ([place])   . End-stage renal disease on hemodialysis ([place])   . Hypoxia 12/05/2016  . Hyperkalemia 12/19/2015  . Hypoglycemia 12/19/2015  . Polysubstance abuse ([place]) 12/19/2015  . Homeless 12/19/2015  . Anemia associated with stage 5 chronic renal failure ([name]) 12/19/2015  . Atypical chest pain 12/19/2013  . Atherosclerosis of native arteries of extremity with intermittent claudication ([place]) 12/04/2012  . ESRD (end stage renal disease) on dialysis ([place]) 12/04/2012  . HEPATITIS C 09/07/2006  . HYPERCHOLESTEROLEMIA 09/07/2006  . HYPERTENSION, BENIGN SYSTEMIC 09/07/2006   PCP:  [name], FNP Pharmacy:   [facility] [place], [place] - [address], [address] [place] [address] Phone: [phone] Fax: [phone]  [place], [place] - [address] [place] [address] Phone: [phone] Fax: [phone]     Social Determinants of Health (SDOH) Interventions    Readmission Risk Interventions No flowsheet data found.

## 2018-12-26 NOTE — Progress Notes (Signed)
PROGRESS NOTE    Raymond [name]  MVH:[id number] DOB: 1955-09-27 DOA: 12/24/2018 PCP: [name], FNP   Brief Narrative: Raymond [name] is a 63 y.o. 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 He presents secondary to nausea/vomiting and diarrhea and found to have colitis of the ascending colon. BP also low with evidence of dehydration.   Assessment & Plan:   Principal Problem:   Gastroenteritis Active Problems:   HYPERTENSION, BENIGN SYSTEMIC   Hypoglycemia   End-stage renal disease on hemodialysis (HCC)   PAF (paroxysmal atrial fibrillation) (HCC)   Anemia due to chronic kidney disease, on chronic dialysis ([place])   Substance use disorder   Gastroenteritis CT abdomen significant for right sided colonic inflammation. Afebrile and no leukocytosis. Symptoms of nausea, vomiting and diarrhea resolved.  Hypotension Dehydration In setting of above. Antihypertensives held. Given IV fluid. Will need to watch carefully in setting of ESRD. Hypotension overnight requiring multiple boluses. -Watch BP overnight  ESRD on HD -Nephrology consulted  Essential hypertension -Hold metoprolol and amlodipine secondary to above problem  Paroxysmal atrial fibrillation Recurrent. RVR yesterday. On amiodarone and metoprolol as an outpatient -Continue amiodarone -metoprolol as mentioned above  Anemia of chronic disease At baseline.   DVT prophylaxis: Heparin subq Code Status:   Code Status: Full Code Family Communication: None Disposition Plan: Discharge likely in 24 hours if BP improves and pending nephrology recommendations for fluid balance   Consultants:   Nephrology  Procedures:   HD  Antimicrobials:  None    Subjective: No issues overnight. Did not  sleep well  Objective: Vitals:   12/25/18 2204 12/26/18 0124 12/26/18 0410 12/26/18 0920  BP: (!) 82/63 98/60 90/72  112/69  Pulse: (!) 135 96 93 84  Resp:   16 18  Temp:   98.8 F (37.1 C) 98.7 F (37.1 C)  TempSrc:   Oral Oral  SpO2:   96% 100%  Weight:   73 kg   Height:        Intake/Output Summary (Last 24 hours) at 12/26/2018 1157 Last data filed at 12/26/2018 0900 Gross per 24 hour  Intake 1682.36 ml  Output -613 ml  Net 2295.36 ml   Filed Weights   12/25/18 1135 12/25/18 1329 12/26/18 0410  Weight: 70.6 kg 70.6 kg 73 kg    Examination:  General exam: Appears calm and comfortable  Respiratory system: Clear to auscultation. Respiratory effort normal. Cardiovascular system: S1 & S2 heard, irregular rhythm. No murmurs, rubs, gallops or clicks. Gastrointestinal system: Abdomen is nondistended, soft and nontender. No organomegaly or masses felt. Normal bowel sounds heard. Central nervous system: Alert and oriented. No focal neurological deficits. Extremities: No edema. No calf tenderness Skin: No cyanosis. No rashes Psychiatry: Judgement and insight appear normal. Mood & affect appropriate.      Data Reviewed: I have personally reviewed following labs and imaging studies  CBC: Recent Labs  Lab 12/24/18 1318 12/25/18 0627 12/25/18 1519 12/26/18 1047  WBC 4.6 3.8* 3.9* 3.5*  NEUTROABS 3.3  --   --   --   HGB 11.6* 9.4* 8.9* 8.2*  HCT 37.4* 29.3* 27.9* 26.4*  MCV 105.4* 103.9* 103.7* 106.5*  PLT 152 129* 135* 841*   Basic Metabolic Panel: Recent Labs  Lab 12/24/18 1318 12/25/18 0627  NA 136 132*  K  4.8 5.7*  CL 92* 93*  CO2 26 24  GLUCOSE 95 83  BUN 56* 59*  CREATININE 14.05* 14.90*  CALCIUM 9.0 8.1*  PHOS  --  5.9*   GFR: Estimated Creatinine Clearance: 4.8 mL/min (A) (by C-G formula based on SCr of 14.9 mg/dL (H)). Liver Function Tests: Recent Labs  Lab 12/24/18 1318 12/25/18 0627  AST 15  --   ALT 12  --   ALKPHOS 57  --   BILITOT 0.5   --   PROT 8.7*  --   ALBUMIN 4.0 3.1*   Recent Labs  Lab 12/24/18 1318  LIPASE 54*   No results for input(s): AMMONIA in the last 168 hours. Coagulation Profile: No results for input(s): INR, PROTIME in the last 168 hours. Cardiac Enzymes: No results for input(s): CKTOTAL, CKMB, CKMBINDEX, TROPONINI in the last 168 hours. BNP (last 3 results) No results for input(s): PROBNP in the last 8760 hours. HbA1C: No results for input(s): HGBA1C in the last 72 hours. CBG: Recent Labs  Lab 12/25/18 2047 12/26/18 0015 12/26/18 0408 12/26/18 0657 12/26/18 1125  GLUCAP 107* 120* 96 95 76   Lipid Profile: No results for input(s): CHOL, HDL, LDLCALC, TRIG, CHOLHDL, LDLDIRECT in the last 72 hours. Thyroid Function Tests: No results for input(s): TSH, T4TOTAL, FREET4, T3FREE, THYROIDAB in the last 72 hours. Anemia Panel: No results for input(s): VITAMINB12, FOLATE, FERRITIN, TIBC, IRON, RETICCTPCT in the last 72 hours. Sepsis Labs: No results for input(s): PROCALCITON, LATICACIDVEN in the last 168 hours.  Recent Results (from the past 240 hour(s))  Culture, blood (Routine X 2) w Reflex to ID Panel     Status: None (Preliminary result)   Collection Time: 12/24/18  7:05 PM   Specimen: BLOOD  Result Value Ref Range Status   Specimen Description BLOOD BLOOD RIGHT FOREARM  Final   Special Requests   Final    BOTTLES DRAWN AEROBIC AND ANAEROBIC Blood Culture adequate volume   Culture   Final    NO GROWTH < 24 HOURS Performed at [facility], [place] [address] [place], [place] [address]    Report Status PENDING  Incomplete  Culture, blood (Routine X 2) w Reflex to ID Panel     Status: None (Preliminary result)   Collection Time: 12/24/18  7:12 PM   Specimen: BLOOD RIGHT HAND  Result Value Ref Range Status   Specimen Description BLOOD RIGHT HAND  Final   Special Requests   Final    BOTTLES DRAWN AEROBIC ONLY Blood Culture results may not be optimal due to an inadequate volume of  blood received in culture bottles   Culture   Final    NO GROWTH < 24 HOURS Performed at [facility], [place] [address], [place], [place] [address]    Report Status PENDING  Incomplete  SARS Coronavirus 2     Status: None   Collection Time: 12/24/18  8:00 PM  Result Value Ref Range Status   SARS Coronavirus 2 NOT DETECTED NOT DETECTED Final    Comment: (NOTE) SARS-CoV-2 target nucleic acids are NOT DETECTED. The SARS-CoV-2 RNA is generally detectable in upper and lower respiratory specimens during the acute phase of infection.  Negative  results do not preclude SARS-CoV-2 infection, do not rule out co-infections with other pathogens, and should not be used as the sole basis for treatment or other patient management decisions.  Negative results must be combined with clinical observations, patient history, and epidemiological information. The expected result  is Not Detected. Fact Sheet for Patients: http://www.biofiredefense.com/wp-content/uploads/2020/03/BIOFIRE-COVID -19-patients.pdf Fact Sheet for Healthcare Providers: http://www.biofiredefense.com/wp-content/uploads/2020/03/BIOFIRE-COVID -19-hcp.pdf This test is not yet approved or cleared by the [place] and  has been authorized for detection and/or diagnosis of SARS-CoV-2 by FDA under an Emergency Use Authorization (EUA).  This EUA will remain in effec t (meaning this test can be used) for the duration of  the COVID-19 declaration under Section 564(b)(1) of the Act, 21 U.S.C. section 360bbb-3(b)(1), unless the authorization is terminated or revoked sooner. Performed at [facility], [place] [address], [place], [place] [address]   MRSA PCR Screening     Status: None   Collection Time: 12/25/18  7:24 AM   Specimen: Nasal Mucosa; Nasopharyngeal  Result Value Ref Range Status   MRSA by PCR NEGATIVE NEGATIVE Final    Comment:        The GeneXpert MRSA Assay (FDA approved for NASAL specimens only), is one  component of a comprehensive MRSA colonization surveillance program. It is not intended to diagnose MRSA infection nor to guide or monitor treatment for MRSA infections. Performed at [facility], [place] [address] [place], [place] [address]          Radiology Studies: Ct Abdomen Pelvis W Contrast  Result Date: 12/24/2018 CLINICAL DATA:  Nausea and vomiting. Dialysis patient. Slight shortness of breath EXAM: CT ABDOMEN AND PELVIS WITH CONTRAST TECHNIQUE: Multidetector CT imaging of the abdomen and pelvis was performed using the standard protocol following bolus administration of intravenous contrast. CONTRAST:  131mL OMNIPAQUE IOHEXOL 300 MG/ML  SOLN COMPARISON:  CT dated 03/31/2018. FINDINGS: Lower chest: No acute abnormality. Hepatobiliary: No focal liver abnormality is seen. No gallstones, gallbladder wall thickening, or biliary dilatation. Pancreas: Unremarkable. No pancreatic ductal dilatation or surrounding inflammatory changes. Spleen: Normal in size without focal abnormality. Adrenals/Urinary Tract: The bilateral adrenal glands are unremarkable. The native kidneys are atrophic with multiple cysts consistent with a history of end-stage renal disease on hemodialysis. There are no radiopaque obstructing kidney stones. The bladder is decompressed which limits evaluation. Stomach/Bowel: There is some mild wall thickening of the ascending colon and cecum. The appendix is unremarkable. The stomach is unremarkable. There is no evidence of a small-bowel obstruction. Vascular/Lymphatic: Aortic atherosclerosis. No enlarged abdominal or pelvic lymph nodes. The abdominal aorta is ectatic measuring up to approximately 2.7 cm in diameter. The patient is status post placement of a bypass graft for the left lower extremity. The proximal graft appears patent. The proximal SFA on the right is occluded. The right profunda femoris is patent. There appears to be some moderate narrowing at the origin of the  celiac axis. Reproductive: The prostate gland is mildly enlarged. There are multiple pockets of gas at the base of the scrotum. These are favored to be external to patient but should be correlated with physical exam and patient's symptoms. Other: No abdominal wall hernia or abnormality. No abdominopelvic ascites. Musculoskeletal: No fracture is seen. IMPRESSION: 1. There is a short segment of wall thickening involving the ascending colon and cecum. Findings are suspicious for infectious or inflammatory colitis. 2. Normal appendix in the right lower quadrant. 3. Atrophic kidneys with multiple cysts consistent with a history of end-stage renal disease. 4. Multiple small pockets of gas at the base of the scrotum favored to be external to the patient but should be correlated with the patient's history and physical exam to help exclude a Fournier gangrene. 5. Advanced atherosclerotic changes of the abdominal aorta. The abdominal aorta  is ectatic. Consider a 5 year follow-up with ultrasound to confirm stability. 6. Patent left lower extremity bypass graft, the distal portion is not visualized. The right superficial femoral artery is occluded proximally, likely a chronic finding. Aortic Atherosclerosis (ICD10-I70.0). Electronically Signed   By: Constance Holster M.D.   On: 12/24/2018 20:04   Dg Chest Port 1 View  Result Date: 12/24/2018 CLINICAL DATA:  Hypoglycemia. EXAM: PORTABLE CHEST 1 VIEW COMPARISON:  Radiograph 09/11/2018. Chest CT 08/10/2018 FINDINGS: Mild cardiomegaly with improvement from prior exam. Aortic atherosclerosis. No pulmonary edema, focal airspace disease, pleural effusion or pneumothorax. Nodular density projecting over the right lower lung zone is stable from prior exams and likely represents nipple shadow. IMPRESSION: Mild cardiomegaly, improved from March 2020 radiographs. No acute cardiopulmonary findings. Electronically Signed   By: Keith Rake M.D.   On: 12/24/2018 19:17         Scheduled Meds:  amiodarone  200 mg Oral Daily   calcitRIOL  1.5 mcg Oral Q T,Th,Sa-HD   Chlorhexidine Gluconate Cloth  6 each Topical Q0600   heparin  5,000 Units Subcutaneous Q8H   multivitamin  1 tablet Oral QHS   sevelamer carbonate  3,200 mg Oral TID WC   Continuous Infusions:  [START ON 12/27/2018] sodium chloride     [START ON 12/27/2018] sodium chloride       LOS: 2 days     Cordelia Poche, MD [facility] 12/26/2018, 11:57 AM  If 7PM-7AM, please contact night-coverage [email or web address]

## 2018-12-26 NOTE — Consult Note (Signed)
[facility] CM Inpatient Consult   12/26/2018  Raymond [name] October 19, 1955 [id number]  Patient screened for extreme high risk score for unplanned readmission score of 64% and for 6 hospitalizations in the past 6 months for patient in the Medicare NGACO.  Review of patient's medical record from MD history and physical reveals patient is as follows from notes:  Raymond [name] is a 62 y.o. 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  Patient states he was in his usual state of health until after reportedly completing a full HD session on 12/22/2018.  Afterwards when he was at home he developed new onset of nausea, vomiting, and diarrhea.  He says he has not had much oral intake since that time due to recurrent symptoms.  He tried Pepto-Bismol for symptoms without relief. He has had intermittent hot and cold spells.   Primary Care Provider is:  [name], FNP is listed as primary care provider [facility].  This provider is NOT a [facility] affiliate.  Attempted phone call to patient's hospital room without success to confirm provider information.   Follow up with inpatient [facility] team, Wendi, was able to verify that the patient is with [facility] and they provide his transportation to appointment.  Patient no longer has a [facility] provider.  his Primary Care Provider is not a [facility] affiliated.   This patient is Not eligible for [facility] Care Management Services.    Informed inpatient [facility] RNCM patient not eligible for [facility] care management services at this time.  Charlesetta Shanks, RN BSN CCM [facility]  [phone] business mobile phone Toll free office [phone]  Fax number:  [phone] [email or web address]

## 2018-12-27 LAB — GLUCOSE, CAPILLARY
Glucose-Capillary: 104 mg/dL — ABNORMAL HIGH (ref 70–99)
Glucose-Capillary: 66 mg/dL — ABNORMAL LOW (ref 70–99)
Glucose-Capillary: 74 mg/dL (ref 70–99)
Glucose-Capillary: 85 mg/dL (ref 70–99)
Glucose-Capillary: 98 mg/dL (ref 70–99)

## 2018-12-27 LAB — VITAMIN B12: Vitamin B-12: 979 pg/mL — ABNORMAL HIGH (ref 180–914)

## 2018-12-27 LAB — FOLATE: Folate: 13.8 ng/mL (ref >5.9)

## 2018-12-27 MED ORDER — METOPROLOL SUCCINATE ER 50 MG PO TB24
50.0000 mg | ORAL_TABLET | Freq: Every day | ORAL | Status: DC
  Filled 2018-12-27: qty 1

## 2018-12-27 MED ORDER — CALCITRIOL 0.5 MCG PO CAPS
ORAL_CAPSULE | ORAL | Status: AC
  Administered 2018-12-27: 16:00:00
  Filled 2018-12-27: qty 3

## 2018-12-27 NOTE — Plan of Care (Signed)
  Problem: Health Behavior/Discharge Planning: Goal: Ability to manage health-related needs will improve Outcome: Completed/Met   Problem: Clinical Measurements: Goal: Ability to maintain clinical measurements within normal limits will improve Outcome: Completed/Met Goal: Will remain free from infection Outcome: Completed/Met Goal: Diagnostic test results will improve Outcome: Completed/Met Goal: Respiratory complications will improve Outcome: Completed/Met Goal: Cardiovascular complication will be avoided Outcome: Completed/Met   Problem: Activity: Goal: Risk for activity intolerance will decrease Outcome: Completed/Met   Problem: Elimination: Goal: Will not experience complications related to bowel motility Outcome: Completed/Met Goal: Will not experience complications related to urinary retention Outcome: Completed/Met   Problem: Pain Managment: Goal: General experience of comfort will improve Outcome: Completed/Met   Problem: Safety: Goal: Ability to remain free from injury will improve Outcome: Completed/Met   Problem: Skin Integrity: Goal: Risk for impaired skin integrity will decrease Outcome: Completed/Met

## 2018-12-27 NOTE — Discharge Instructions (Signed)
Raymond [name],  You are in the hospital because of significant dehydration requiring IV fluids.  He also had some low blood pressure and very fast heart rate called atrial fibrillation.  You have had this in the past.  Please continue your metoprolol and amiodarone.  I am going to discontinue the amlodipine for right now because your blood pressure as this can affect your hemodialysis.  With regard to the inflammation of your colon, this is likely secondary to an infection.  Most infections like this are caused by viruses and your infection appears to have resolved.  Please follow-up with your primary care physician.

## 2018-12-27 NOTE — Progress Notes (Signed)
  [facility] Progress Note   Subjective:  Seen in room. No complaints. Denies CP/SOB. For HD today - says he can go home after HD.   Objective Vitals:   12/26/18 1628 12/26/18 1950 12/27/18 0417 12/27/18 0913  BP: 113/86 137/90 135/75 133/83  Pulse: 97 (!) 104 65 66  Resp: 18  16 18   Temp: 98.6 F (37 C) 98.4 F (36.9 C) 98.7 F (37.1 C) 98.3 F (36.8 C)  TempSrc: Oral Oral Oral Oral  SpO2: 97% 99% 97% 99%  Weight:   74.5 kg   Height:        Physical Exam General: WNWD male NAD  Heart: irregular rhythm   Lungs: CTAB  Abdomen: soft NT/ND Extremities: No LE edema  Dialysis Access: L AVF +bruit    Weight change: 3.926 kg   Additional Objective Labs: Basic Metabolic Panel: Recent Labs  Lab 12/24/18 1318 12/25/18 0627 12/26/18 1047  NA 136 132* 136  K 4.8 5.7* 5.0  CL 92* 93* 97*  CO2 26 24 28   GLUCOSE 95 83 88  BUN 56* 59* 47*  CREATININE 14.05* 14.90* 12.20*  CALCIUM 9.0 8.1* 8.0*  PHOS  --  5.9* 4.2   CBC: Recent Labs  Lab 12/24/18 1318 12/25/18 0627 12/25/18 1519 12/26/18 1047  WBC 4.6 3.8* 3.9* 3.5*  NEUTROABS 3.3  --   --   --   HGB 11.6* 9.4* 8.9* 8.2*  HCT 37.4* 29.3* 27.9* 26.4*  MCV 105.4* 103.9* 103.7* 106.5*  PLT 152 129* 135* 131*   Blood Culture    Component Value Date/Time   SDES BLOOD RIGHT HAND 12/24/2018 1912   SPECREQUEST  12/24/2018 1912    BOTTLES DRAWN AEROBIC ONLY Blood Culture results may not be optimal due to an inadequate volume of blood received in culture bottles   CULT  12/24/2018 1912    NO GROWTH 2 DAYS Performed at [facility], [address], [place], [place] [address]    REPTSTATUS PENDING 12/24/2018 1912     Medications: . sodium chloride    . sodium chloride     . amiodarone  200 mg Oral Daily  . calcitRIOL  1.5 mcg Oral Q T,Th,Sa-HD  . Chlorhexidine Gluconate Cloth  6 each Topical Q0600  . heparin  5,000 Units Subcutaneous Q8H  . multivitamin  1 tablet Oral QHS  . sevelamer  carbonate  3,200 mg Oral TID WC    Dialysis Orders:  [facility] TTS 4h 450/800 EDW 70.5kg 2K/2.5Ca UF Profile 4 L AVF No heparin  Parsabiv 5mg  TIW Venofer 50 mg  q week Mircera 200 q 2 weeks (last dosed 6/11)  Calcitriol 1.50 mcg PO TIW   Assessment/Plan: 1. N/VD - Likely infectious gastroenteritis. N/V/D Resolved. Tolerating PO. Per primary  2. ESRD -  TTS.  Next HD 6/18. HD today.  3. Hypertension/volume  - Hypotensive on  admission. BP meds have been held and got fluid bolus. Now BP coming back up.  No volume on exam.  Keep even on HD  4. Anemia  - Hgb 11.6>9.4>8.9 >8.2 Downward trend. Check FOBT.  ESA recently dosed as outpatient  5. Metabolic bone disease -  CA/Phos ok. Continue VDRA/binders. Parsabiv not available in hospital  6. P Afib -  Aflutter/AV block on HD 6/16.   On amiodarone + metoprolol at home    Lynnda Child PA-C [place] Pager [phone] 12/27/2018,10:09 AM  LOS: 3 days

## 2018-12-27 NOTE — Care Management Important Message (Signed)
Important Message  Patient Details  Name: Raymond [name] MRN: [id number] Date of Birth: [birth year]   Medicare Important Message Given:  Yes    [name] [address] 12/27/2018, 11:53 AM

## 2018-12-27 NOTE — Discharge Summary (Signed)
Physician Discharge Summary  Raymond [name] PTW:[id number] DOB: [birth year] DOA: 12/24/2018  PCP: [name], FNP  Admit date: 12/24/2018 Discharge date: 12/27/2018  Admitted From: Home Disposition: Home  Recommendations for Outpatient Follow-up:  1. Follow up with PCP in 1 week 2. Please obtain BMP/CBC in one week 3. Please follow up on the following pending results: Blood culture  Home Health: None Equipment/Devices: None  Discharge Condition: Stable CODE STATUS: Full code Diet recommendation: Heart healthy/renal diet  Brief/Interim Summary:  Admission HPI written by Lenore Cordia, MD   Chief Complaint: Nausea, vomiting, diarrhea  HPI: Raymond [name] is a 63 y.o. 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  Patient states he was in his usual state of health until after reportedly completing a full HD session on 12/22/2018.  Afterwards when he was at home he developed new onset of nausea, vomiting, and diarrhea.  He says he has not had much oral intake since that time due to recurrent symptoms.  He tried Pepto-Bismol for symptoms without relief.  He has had intermittent hot and cold spells.  He denies any subjective fevers or diaphoresis.  He denies any chest pain, dyspnea, or swelling in his legs.  He says he no longer makes urine.  He reports smoking about 5 cigarettes/day.  He reports occasional alcohol use without history of withdrawal.  He admits to cocaine use and reports last use about 1 month ago.    Hospital course:  Gastroenteritis CT abdomen significant for right sided colonic inflammation. Afebrile and no leukocytosis. Symptoms of nausea, vomiting and diarrhea  resolved.  Hypotension Dehydration In setting of above. Antihypertensives held. Given IV fluid. Will need to watch carefully in setting of ESRD. Hypotension overnight requiring multiple boluses. BP rebounded and is now normal. Restarted home metoprolol prior to discharge. Discontinue amlodipine on discharge  ESRD on HD -Nephrology consulted  Essential hypertension Held home metoprolol and amlodipine secondary to hypotension initially.  Will discontinue amlodipine on discharge.  Continue metoprolol.  Paroxysmal atrial fibrillation Recurrent.  Patient had an episode of RVR while undergoing hemodialysis.   Episode resolved with beta-blocker.  Continued home amiodarone.  Resumed home metoprolol prior to discharge.    Anemia of chronic disease At baseline.  Discharge Diagnoses:  Principal Problem:   Gastroenteritis Active Problems:   HYPERTENSION, BENIGN SYSTEMIC   Hypoglycemia   End-stage renal disease on hemodialysis (HCC)   PAF (paroxysmal atrial fibrillation) (HCC)   Anemia due to chronic kidney disease, on chronic dialysis [facility])   Substance use disorder    Discharge Instructions   Allergies as of 12/27/2018   No Known Allergies     Medication List    STOP taking these medications   amLODipine 2.5 MG tablet Commonly known as: NORVASC   doxycycline 50 MG capsule Commonly known as: VIBRAMYCIN   predniSONE 10 MG tablet Commonly known as: DELTASONE     TAKE these medications   amiodarone 200 MG tablet Commonly known as: PACERONE Take 1 tablet (200 mg total) by mouth daily.   bismuth subsalicylate 700 MG chewable tablet Commonly known as: PEPTO BISMOL Chew 524 mg by mouth as needed for indigestion or diarrhea or loose stools.   calcitRIOL 0.5 MCG  capsule Commonly known as: ROCALTROL Take 3 capsules (1.5 mcg total) by mouth 3 (three) times a week.   metoprolol succinate 50 MG 24 hr tablet Commonly known as: TOPROL-XL Take 1 tablet (50 mg total) by mouth  daily for 30 days. Take with or immediately following a meal.   sevelamer carbonate 800 MG tablet Commonly known as: RENVELA Take 4 tablets (3,200 mg total) by mouth 3 (three) times daily with meals. What changed:   how much to take  additional instructions       No Known Allergies  Consultations:  Nephrology   Procedures/Studies: Ct Abdomen Pelvis W Contrast  Result Date: 12/24/2018 CLINICAL DATA:  Nausea and vomiting. Dialysis patient. Slight shortness of breath EXAM: CT ABDOMEN AND PELVIS WITH CONTRAST TECHNIQUE: Multidetector CT imaging of the abdomen and pelvis was performed using the standard protocol following bolus administration of intravenous contrast. CONTRAST:  174mL OMNIPAQUE IOHEXOL 300 MG/ML  SOLN COMPARISON:  CT dated 03/31/2018. FINDINGS: Lower chest: No acute abnormality. Hepatobiliary: No focal liver abnormality is seen. No gallstones, gallbladder wall thickening, or biliary dilatation. Pancreas: Unremarkable. No pancreatic ductal dilatation or surrounding inflammatory changes. Spleen: Normal in size without focal abnormality. Adrenals/Urinary Tract: The bilateral adrenal glands are unremarkable. The native kidneys are atrophic with multiple cysts consistent with a history of end-stage renal disease on hemodialysis. There are no radiopaque obstructing kidney stones. The bladder is decompressed which limits evaluation. Stomach/Bowel: There is some mild wall thickening of the ascending colon and cecum. The appendix is unremarkable. The stomach is unremarkable. There is no evidence of a small-bowel obstruction. Vascular/Lymphatic: Aortic atherosclerosis. No enlarged abdominal or pelvic lymph nodes. The abdominal aorta is ectatic measuring up to approximately 2.7 cm in diameter. The patient is status post placement of a bypass graft for the left lower extremity. The proximal graft appears patent. The proximal SFA on the right is occluded. The right profunda femoris is patent.  There appears to be some moderate narrowing at the origin of the celiac axis. Reproductive: The prostate gland is mildly enlarged. There are multiple pockets of gas at the base of the scrotum. These are favored to be external to patient but should be correlated with physical exam and patient's symptoms. Other: No abdominal wall hernia or abnormality. No abdominopelvic ascites. Musculoskeletal: No fracture is seen. IMPRESSION: 1. There is a short segment of wall thickening involving the ascending colon and cecum. Findings are suspicious for infectious or inflammatory colitis. 2. Normal appendix in the right lower quadrant. 3. Atrophic kidneys with multiple cysts consistent with a history of end-stage renal disease. 4. Multiple small pockets of gas at the base of the scrotum favored to be external to the patient but should be correlated with the patient's history and physical exam to help exclude a Fournier gangrene. 5. Advanced atherosclerotic changes of the abdominal aorta. The abdominal aorta is ectatic. Consider a 5 year follow-up with ultrasound to confirm stability. 6. Patent left lower extremity bypass graft, the distal portion is not visualized. The right superficial femoral artery is occluded proximally, likely a chronic finding. Aortic Atherosclerosis (ICD10-I70.0). Electronically Signed   By: Constance Holster M.D.   On: 12/24/2018 20:04   Dg Chest Port 1 View  Result Date: 12/24/2018 CLINICAL DATA:  Hypoglycemia. EXAM: PORTABLE CHEST 1 VIEW COMPARISON:  Radiograph 09/11/2018. Chest CT 08/10/2018 FINDINGS: Mild cardiomegaly with improvement from prior exam. Aortic atherosclerosis. No pulmonary edema, focal airspace disease, pleural effusion or pneumothorax. Nodular density projecting over the right lower lung zone  is stable from prior exams and likely represents nipple shadow. IMPRESSION: Mild cardiomegaly, improved from March 2020 radiographs. No acute cardiopulmonary findings. Electronically Signed    By: Keith Rake M.D.   On: 12/24/2018 19:17      Subjective: No chest pain, nausea, vomiting, dyspnea, abdominal pain, diarrhea  Discharge Exam: Vitals:   12/27/18 0417 12/27/18 0913  BP: 135/75 133/83  Pulse: 65 66  Resp: 16 18  Temp: 98.7 F (37.1 C) 98.3 F (36.8 C)  SpO2: 97% 99%   Vitals:   12/26/18 1628 12/26/18 1950 12/27/18 0417 12/27/18 0913  BP: 113/86 137/90 135/75 133/83  Pulse: 97 (!) 104 65 66  Resp: 18  16 18   Temp: 98.6 F (37 C) 98.4 F (36.9 C) 98.7 F (37.1 C) 98.3 F (36.8 C)  TempSrc: Oral Oral Oral Oral  SpO2: 97% 99% 97% 99%  Weight:   74.5 kg   Height:        General: Pt is alert, awake, not in acute distress. Cardiovascular: RRR, S1/S2 +, no rubs, no gallops Respiratory: CTA bilaterally, no wheezing, no rhonchi Abdominal: Soft, NT, ND, bowel sounds + Extremities: no edema, no cyanosis    The results of significant diagnostics from this hospitalization (including imaging, microbiology, ancillary and laboratory) are listed below for reference.     Microbiology: Recent Results (from the past 240 hour(s))  Culture, blood (Routine X 2) w Reflex to ID Panel     Status: None (Preliminary result)   Collection Time: 12/24/18  7:05 PM   Specimen: BLOOD  Result Value Ref Range Status   Specimen Description BLOOD BLOOD RIGHT FOREARM  Final   Special Requests   Final    BOTTLES DRAWN AEROBIC AND ANAEROBIC Blood Culture adequate volume   Culture   Final    NO GROWTH 2 DAYS Performed at [facility], [place] [address] [place], [place] [address]    Report Status PENDING  Incomplete  Culture, blood (Routine X 2) w Reflex to ID Panel     Status: None (Preliminary result)   Collection Time: 12/24/18  7:12 PM   Specimen: BLOOD RIGHT HAND  Result Value Ref Range Status   Specimen Description BLOOD RIGHT HAND  Final   Special Requests   Final    BOTTLES DRAWN AEROBIC ONLY Blood Culture results may not be optimal due to an inadequate volume  of blood received in culture bottles   Culture   Final    NO GROWTH 2 DAYS Performed at [facility], [place] [address], [place], [place] [address]    Report Status PENDING  Incomplete  SARS Coronavirus 2     Status: None   Collection Time: 12/24/18  8:00 PM  Result Value Ref Range Status   SARS Coronavirus 2 NOT DETECTED NOT DETECTED Final    Comment: (NOTE) SARS-CoV-2 target nucleic acids are NOT DETECTED. The SARS-CoV-2 RNA is generally detectable in upper and lower respiratory specimens during the acute phase of infection.  Negative  results do not preclude SARS-CoV-2 infection, do not rule out co-infections with other pathogens, and should not be used as the sole basis for treatment or other patient management decisions.  Negative results must be combined with clinical observations, patient history, and epidemiological information. The expected result is Not Detected. Fact Sheet for Patients: http://www.biofiredefense.com/wp-content/uploads/2020/03/BIOFIRE-COVID -19-patients.pdf Fact Sheet for Healthcare Providers: http://www.biofiredefense.com/wp-content/uploads/2020/03/BIOFIRE-COVID -19-hcp.pdf This test is not yet approved or cleared by the [place] and  has been authorized for  detection and/or diagnosis of SARS-CoV-2 by FDA under an Emergency Use Authorization (EUA).  This EUA will remain in effec t (meaning this test can be used) for the duration of  the COVID-19 declaration under Section 564(b)(1) of the Act, 21 U.S.C. section 360bbb-3(b)(1), unless the authorization is terminated or revoked sooner. Performed at [facility], [place] [address] [place], [place] [address]   MRSA PCR Screening     Status: None   Collection Time: 12/25/18  7:24 AM   Specimen: Nasal Mucosa; Nasopharyngeal  Result Value Ref Range Status   MRSA by PCR NEGATIVE NEGATIVE Final    Comment:        The GeneXpert MRSA Assay (FDA approved for NASAL specimens only), is one  component of a comprehensive MRSA colonization surveillance program. It is not intended to diagnose MRSA infection nor to guide or monitor treatment for MRSA infections. Performed at [facility], [place] [address], [place], [place] [address]      Labs: BNP (last 3 results) No results for input(s): BNP in the last 8760 hours. Basic Metabolic Panel: Recent Labs  Lab 12/24/18 1318 12/25/18 0627 12/26/18 1047  NA 136 132* 136  K 4.8 5.7* 5.0  CL 92* 93* 97*  CO2 26 24 28   GLUCOSE 95 83 88  BUN 56* 59* 47*  CREATININE 14.05* 14.90* 12.20*  CALCIUM 9.0 8.1* 8.0*  PHOS  --  5.9* 4.2   Liver Function Tests: Recent Labs  Lab 12/24/18 1318 12/25/18 0627 12/26/18 1047  AST 15  --   --   ALT 12  --   --   ALKPHOS 57  --   --   BILITOT 0.5  --   --   PROT 8.7*  --   --   ALBUMIN 4.0 3.1* 2.6*   Recent Labs  Lab 12/24/18 1318  LIPASE 54*   No results for input(s): AMMONIA in the last 168 hours. CBC: Recent Labs  Lab 12/24/18 1318 12/25/18 0627 12/25/18 1519 12/26/18 1047  WBC 4.6 3.8* 3.9* 3.5*  NEUTROABS 3.3  --   --   --   HGB 11.6* 9.4* 8.9* 8.2*  HCT 37.4* 29.3* 27.9* 26.4*  MCV 105.4* 103.9* 103.7* 106.5*  PLT 152 129* 135* 131*   Cardiac Enzymes: No results for input(s): CKTOTAL, CKMB, CKMBINDEX, TROPONINI in the last 168 hours. BNP: Invalid input(s): POCBNP CBG: Recent Labs  Lab 12/26/18 1627 12/26/18 1953 12/27/18 0030 12/27/18 0415 12/27/18 0652  GLUCAP 81 155* 104* 98 85   D-Dimer No results for input(s): DDIMER in the last 72 hours. Hgb A1c No results for input(s): HGBA1C in the last 72 hours. Lipid Profile No results for input(s): CHOL, HDL, LDLCALC, TRIG, CHOLHDL, LDLDIRECT in the last 72 hours. Thyroid function studies No results for input(s): TSH, T4TOTAL, T3FREE, THYROIDAB in the last 72 hours.  Invalid input(s): FREET3 Anemia work up No results for input(s): VITAMINB12, FOLATE, FERRITIN, TIBC, IRON, RETICCTPCT in the last  72 hours. Urinalysis    Component Value Date/Time   COLORURINE YELLOW 05/08/2010 1335   APPEARANCEUR CLEAR 05/08/2010 1335   LABSPEC 1.009 05/08/2010 1335   PHURINE 6.0 05/08/2010 1335   GLUCOSEU NEGATIVE 05/08/2010 1335   HGBUR NEGATIVE 05/08/2010 1335   BILIRUBINUR NEGATIVE 05/08/2010 1335   KETONESUR NEGATIVE 05/08/2010 1335   PROTEINUR 100 (A) 05/08/2010 1335   UROBILINOGEN 0.2 05/08/2010 1335   NITRITE NEGATIVE 05/08/2010 1335   LEUKOCYTESUR SMALL (A) 05/08/2010 1335   Sepsis Labs Invalid  input(s): PROCALCITONIN,  WBC,  LACTICIDVEN Microbiology Recent Results (from the past 240 hour(s))  Culture, blood (Routine X 2) w Reflex to ID Panel     Status: None (Preliminary result)   Collection Time: 12/24/18  7:05 PM   Specimen: BLOOD  Result Value Ref Range Status   Specimen Description BLOOD BLOOD RIGHT FOREARM  Final   Special Requests   Final    BOTTLES DRAWN AEROBIC AND ANAEROBIC Blood Culture adequate volume   Culture   Final    NO GROWTH 2 DAYS Performed at [facility], [place] [address] [place], [place] [address]    Report Status PENDING  Incomplete  Culture, blood (Routine X 2) w Reflex to ID Panel     Status: None (Preliminary result)   Collection Time: 12/24/18  7:12 PM   Specimen: BLOOD RIGHT HAND  Result Value Ref Range Status   Specimen Description BLOOD RIGHT HAND  Final   Special Requests   Final    BOTTLES DRAWN AEROBIC ONLY Blood Culture results may not be optimal due to an inadequate volume of blood received in culture bottles   Culture   Final    NO GROWTH 2 DAYS Performed at [facility], [place] [address] [place], [place] [address]    Report Status PENDING  Incomplete  SARS Coronavirus 2     Status: None   Collection Time: 12/24/18  8:00 PM  Result Value Ref Range Status   SARS Coronavirus 2 NOT DETECTED NOT DETECTED Final    Comment: (NOTE) SARS-CoV-2 target nucleic acids are NOT DETECTED. The SARS-CoV-2 RNA is generally detectable in  upper and lower respiratory specimens during the acute phase of infection.  Negative  results do not preclude SARS-CoV-2 infection, do not rule out co-infections with other pathogens, and should not be used as the sole basis for treatment or other patient management decisions.  Negative results must be combined with clinical observations, patient history, and epidemiological information. The expected result is Not Detected. Fact Sheet for Patients: http://www.biofiredefense.com/wp-content/uploads/2020/03/BIOFIRE-COVID -19-patients.pdf Fact Sheet for Healthcare Providers: http://www.biofiredefense.com/wp-content/uploads/2020/03/BIOFIRE-COVID -19-hcp.pdf This test is not yet approved or cleared by the [place] and  has been authorized for detection and/or diagnosis of SARS-CoV-2 by FDA under an Emergency Use Authorization (EUA).  This EUA will remain in effec t (meaning this test can be used) for the duration of  the COVID-19 declaration under Section 564(b)(1) of the Act, 21 U.S.C. section 360bbb-3(b)(1), unless the authorization is terminated or revoked sooner. Performed at [facility], [place] [address], [place], [place] [address]   MRSA PCR Screening     Status: None   Collection Time: 12/25/18  7:24 AM   Specimen: Nasal Mucosa; Nasopharyngeal  Result Value Ref Range Status   MRSA by PCR NEGATIVE NEGATIVE Final    Comment:        The GeneXpert MRSA Assay (FDA approved for NASAL specimens only), is one component of a comprehensive MRSA colonization surveillance program. It is not intended to diagnose MRSA infection nor to guide or monitor treatment for MRSA infections. Performed at [facility], [place] [address] [place], [place] [address]     SIGNED:   Cordelia Poche, MD [facility] 12/27/2018, 11:08 AM

## 2018-12-27 NOTE — Progress Notes (Signed)
CRITICAL VALUE ALERT  Critical Value: CBG 66   Date & Time Notied:  11:12  Provider Notified: MD Netty  Orders Received/Actions taken: Patient was given juice and he had his lunch.

## 2018-12-27 NOTE — Progress Notes (Signed)
DISCHARGE NOTE [facility] to be discharged Home per MD order. Patient verbalized understanding.  Skin clean, dry and intact without evidence of skin break down, no evidence of skin tears noted. IV catheter discontinued intact. Site without signs and symptoms of complications. Dressing and pressure applied. Pt denies pain at the site currently. No complaints noted.  Patient free of lines, drains, and wounds.   Discharge packet assembled. An After Visit Summary (AVS) was printed and given to the EMS personnel. Patient escorted via stretcher and discharged to [facility] via ambulance. Report called to accepting facility; all questions and concerns addressed.   Arlyss Repress, RN

## 2018-12-29 DIAGNOSIS — D509 Iron deficiency anemia, unspecified: Secondary | ICD-10-CM | POA: Diagnosis not present

## 2018-12-29 DIAGNOSIS — D631 Anemia in chronic kidney disease: Secondary | ICD-10-CM | POA: Diagnosis not present

## 2018-12-29 DIAGNOSIS — N186 End stage renal disease: Secondary | ICD-10-CM | POA: Diagnosis not present

## 2018-12-29 DIAGNOSIS — N2581 Secondary hyperparathyroidism of renal origin: Secondary | ICD-10-CM | POA: Diagnosis not present

## 2018-12-29 LAB — CULTURE, BLOOD (ROUTINE X 2)
Culture: NO GROWTH
Culture: NO GROWTH
Special Requests: ADEQUATE

## 2019-01-01 DIAGNOSIS — N186 End stage renal disease: Secondary | ICD-10-CM | POA: Diagnosis not present

## 2019-01-01 DIAGNOSIS — D631 Anemia in chronic kidney disease: Secondary | ICD-10-CM | POA: Diagnosis not present

## 2019-01-01 DIAGNOSIS — N2581 Secondary hyperparathyroidism of renal origin: Secondary | ICD-10-CM | POA: Diagnosis not present

## 2019-01-01 DIAGNOSIS — D509 Iron deficiency anemia, unspecified: Secondary | ICD-10-CM | POA: Diagnosis not present

## 2019-01-03 DIAGNOSIS — D509 Iron deficiency anemia, unspecified: Secondary | ICD-10-CM | POA: Diagnosis not present

## 2019-01-03 DIAGNOSIS — D631 Anemia in chronic kidney disease: Secondary | ICD-10-CM | POA: Diagnosis not present

## 2019-01-03 DIAGNOSIS — N186 End stage renal disease: Secondary | ICD-10-CM | POA: Diagnosis not present

## 2019-01-03 DIAGNOSIS — N2581 Secondary hyperparathyroidism of renal origin: Secondary | ICD-10-CM | POA: Diagnosis not present

## 2019-01-05 DIAGNOSIS — D631 Anemia in chronic kidney disease: Secondary | ICD-10-CM | POA: Diagnosis not present

## 2019-01-05 DIAGNOSIS — N2581 Secondary hyperparathyroidism of renal origin: Secondary | ICD-10-CM | POA: Diagnosis not present

## 2019-01-05 DIAGNOSIS — N186 End stage renal disease: Secondary | ICD-10-CM | POA: Diagnosis not present

## 2019-01-05 DIAGNOSIS — D509 Iron deficiency anemia, unspecified: Secondary | ICD-10-CM | POA: Diagnosis not present

## 2019-01-08 DIAGNOSIS — N186 End stage renal disease: Secondary | ICD-10-CM | POA: Diagnosis not present

## 2019-01-08 DIAGNOSIS — D509 Iron deficiency anemia, unspecified: Secondary | ICD-10-CM | POA: Diagnosis not present

## 2019-01-08 DIAGNOSIS — N2581 Secondary hyperparathyroidism of renal origin: Secondary | ICD-10-CM | POA: Diagnosis not present

## 2019-01-08 DIAGNOSIS — D631 Anemia in chronic kidney disease: Secondary | ICD-10-CM | POA: Diagnosis not present

## 2019-01-09 DIAGNOSIS — Z992 Dependence on renal dialysis: Secondary | ICD-10-CM | POA: Diagnosis not present

## 2019-01-09 DIAGNOSIS — N2889 Other specified disorders of kidney and ureter: Secondary | ICD-10-CM | POA: Diagnosis not present

## 2019-01-09 DIAGNOSIS — N186 End stage renal disease: Secondary | ICD-10-CM | POA: Diagnosis not present

## 2019-01-12 DIAGNOSIS — N186 End stage renal disease: Secondary | ICD-10-CM | POA: Diagnosis not present

## 2019-01-12 DIAGNOSIS — D509 Iron deficiency anemia, unspecified: Secondary | ICD-10-CM | POA: Diagnosis not present

## 2019-01-12 DIAGNOSIS — N2581 Secondary hyperparathyroidism of renal origin: Secondary | ICD-10-CM | POA: Diagnosis not present

## 2019-01-12 DIAGNOSIS — D631 Anemia in chronic kidney disease: Secondary | ICD-10-CM | POA: Diagnosis not present

## 2019-01-15 DIAGNOSIS — D509 Iron deficiency anemia, unspecified: Secondary | ICD-10-CM | POA: Diagnosis not present

## 2019-01-15 DIAGNOSIS — N2581 Secondary hyperparathyroidism of renal origin: Secondary | ICD-10-CM | POA: Diagnosis not present

## 2019-01-15 DIAGNOSIS — D631 Anemia in chronic kidney disease: Secondary | ICD-10-CM | POA: Diagnosis not present

## 2019-01-15 DIAGNOSIS — N186 End stage renal disease: Secondary | ICD-10-CM | POA: Diagnosis not present

## 2019-01-17 DIAGNOSIS — N186 End stage renal disease: Secondary | ICD-10-CM | POA: Diagnosis not present

## 2019-01-17 DIAGNOSIS — D631 Anemia in chronic kidney disease: Secondary | ICD-10-CM | POA: Diagnosis not present

## 2019-01-17 DIAGNOSIS — N2581 Secondary hyperparathyroidism of renal origin: Secondary | ICD-10-CM | POA: Diagnosis not present

## 2019-01-17 DIAGNOSIS — D509 Iron deficiency anemia, unspecified: Secondary | ICD-10-CM | POA: Diagnosis not present

## 2019-01-19 DIAGNOSIS — D631 Anemia in chronic kidney disease: Secondary | ICD-10-CM | POA: Diagnosis not present

## 2019-01-19 DIAGNOSIS — N186 End stage renal disease: Secondary | ICD-10-CM | POA: Diagnosis not present

## 2019-01-19 DIAGNOSIS — N2581 Secondary hyperparathyroidism of renal origin: Secondary | ICD-10-CM | POA: Diagnosis not present

## 2019-01-19 DIAGNOSIS — D509 Iron deficiency anemia, unspecified: Secondary | ICD-10-CM | POA: Diagnosis not present

## 2019-01-22 DIAGNOSIS — N2581 Secondary hyperparathyroidism of renal origin: Secondary | ICD-10-CM | POA: Diagnosis not present

## 2019-01-22 DIAGNOSIS — N186 End stage renal disease: Secondary | ICD-10-CM | POA: Diagnosis not present

## 2019-01-22 DIAGNOSIS — D631 Anemia in chronic kidney disease: Secondary | ICD-10-CM | POA: Diagnosis not present

## 2019-01-22 DIAGNOSIS — D509 Iron deficiency anemia, unspecified: Secondary | ICD-10-CM | POA: Diagnosis not present

## 2019-01-26 DIAGNOSIS — D631 Anemia in chronic kidney disease: Secondary | ICD-10-CM | POA: Diagnosis not present

## 2019-01-26 DIAGNOSIS — N2581 Secondary hyperparathyroidism of renal origin: Secondary | ICD-10-CM | POA: Diagnosis not present

## 2019-01-26 DIAGNOSIS — D509 Iron deficiency anemia, unspecified: Secondary | ICD-10-CM | POA: Diagnosis not present

## 2019-01-26 DIAGNOSIS — N186 End stage renal disease: Secondary | ICD-10-CM | POA: Diagnosis not present

## 2019-01-29 DIAGNOSIS — N186 End stage renal disease: Secondary | ICD-10-CM | POA: Diagnosis not present

## 2019-01-29 DIAGNOSIS — N2581 Secondary hyperparathyroidism of renal origin: Secondary | ICD-10-CM | POA: Diagnosis not present

## 2019-01-29 DIAGNOSIS — D631 Anemia in chronic kidney disease: Secondary | ICD-10-CM | POA: Diagnosis not present

## 2019-01-29 DIAGNOSIS — D509 Iron deficiency anemia, unspecified: Secondary | ICD-10-CM | POA: Diagnosis not present

## 2019-01-31 DIAGNOSIS — D631 Anemia in chronic kidney disease: Secondary | ICD-10-CM | POA: Diagnosis not present

## 2019-01-31 DIAGNOSIS — N2581 Secondary hyperparathyroidism of renal origin: Secondary | ICD-10-CM | POA: Diagnosis not present

## 2019-01-31 DIAGNOSIS — D509 Iron deficiency anemia, unspecified: Secondary | ICD-10-CM | POA: Diagnosis not present

## 2019-01-31 DIAGNOSIS — N186 End stage renal disease: Secondary | ICD-10-CM | POA: Diagnosis not present

## 2019-02-04 DIAGNOSIS — Z79899 Other long term (current) drug therapy: Secondary | ICD-10-CM | POA: Diagnosis not present

## 2019-02-05 DIAGNOSIS — N186 End stage renal disease: Secondary | ICD-10-CM | POA: Diagnosis not present

## 2019-02-05 DIAGNOSIS — D631 Anemia in chronic kidney disease: Secondary | ICD-10-CM | POA: Diagnosis not present

## 2019-02-05 DIAGNOSIS — D509 Iron deficiency anemia, unspecified: Secondary | ICD-10-CM | POA: Diagnosis not present

## 2019-02-05 DIAGNOSIS — N2581 Secondary hyperparathyroidism of renal origin: Secondary | ICD-10-CM | POA: Diagnosis not present

## 2019-02-07 DIAGNOSIS — N186 End stage renal disease: Secondary | ICD-10-CM | POA: Diagnosis not present

## 2019-02-07 DIAGNOSIS — D631 Anemia in chronic kidney disease: Secondary | ICD-10-CM | POA: Diagnosis not present

## 2019-02-07 DIAGNOSIS — D509 Iron deficiency anemia, unspecified: Secondary | ICD-10-CM | POA: Diagnosis not present

## 2019-02-07 DIAGNOSIS — N2581 Secondary hyperparathyroidism of renal origin: Secondary | ICD-10-CM | POA: Diagnosis not present

## 2019-02-09 DIAGNOSIS — Z992 Dependence on renal dialysis: Secondary | ICD-10-CM | POA: Diagnosis not present

## 2019-02-09 DIAGNOSIS — N2889 Other specified disorders of kidney and ureter: Secondary | ICD-10-CM | POA: Diagnosis not present

## 2019-02-09 DIAGNOSIS — N186 End stage renal disease: Secondary | ICD-10-CM | POA: Diagnosis not present

## 2019-02-14 DIAGNOSIS — N2581 Secondary hyperparathyroidism of renal origin: Secondary | ICD-10-CM | POA: Diagnosis not present

## 2019-02-14 DIAGNOSIS — D509 Iron deficiency anemia, unspecified: Secondary | ICD-10-CM | POA: Diagnosis not present

## 2019-02-14 DIAGNOSIS — Z992 Dependence on renal dialysis: Secondary | ICD-10-CM | POA: Diagnosis not present

## 2019-02-14 DIAGNOSIS — N186 End stage renal disease: Secondary | ICD-10-CM | POA: Diagnosis not present

## 2019-02-19 DIAGNOSIS — N186 End stage renal disease: Secondary | ICD-10-CM | POA: Diagnosis not present

## 2019-02-19 DIAGNOSIS — N2581 Secondary hyperparathyroidism of renal origin: Secondary | ICD-10-CM | POA: Diagnosis not present

## 2019-02-19 DIAGNOSIS — Z992 Dependence on renal dialysis: Secondary | ICD-10-CM | POA: Diagnosis not present

## 2019-02-19 DIAGNOSIS — D509 Iron deficiency anemia, unspecified: Secondary | ICD-10-CM | POA: Diagnosis not present

## 2019-02-21 DIAGNOSIS — N186 End stage renal disease: Secondary | ICD-10-CM | POA: Diagnosis not present

## 2019-02-21 DIAGNOSIS — N2581 Secondary hyperparathyroidism of renal origin: Secondary | ICD-10-CM | POA: Diagnosis not present

## 2019-02-21 DIAGNOSIS — D509 Iron deficiency anemia, unspecified: Secondary | ICD-10-CM | POA: Diagnosis not present

## 2019-02-21 DIAGNOSIS — Z992 Dependence on renal dialysis: Secondary | ICD-10-CM | POA: Diagnosis not present

## 2019-02-26 DIAGNOSIS — N2581 Secondary hyperparathyroidism of renal origin: Secondary | ICD-10-CM | POA: Diagnosis not present

## 2019-02-26 DIAGNOSIS — Z992 Dependence on renal dialysis: Secondary | ICD-10-CM | POA: Diagnosis not present

## 2019-02-26 DIAGNOSIS — N186 End stage renal disease: Secondary | ICD-10-CM | POA: Diagnosis not present

## 2019-02-26 DIAGNOSIS — D509 Iron deficiency anemia, unspecified: Secondary | ICD-10-CM | POA: Diagnosis not present

## 2019-02-28 DIAGNOSIS — N2581 Secondary hyperparathyroidism of renal origin: Secondary | ICD-10-CM | POA: Diagnosis not present

## 2019-02-28 DIAGNOSIS — Z992 Dependence on renal dialysis: Secondary | ICD-10-CM | POA: Diagnosis not present

## 2019-02-28 DIAGNOSIS — D509 Iron deficiency anemia, unspecified: Secondary | ICD-10-CM | POA: Diagnosis not present

## 2019-02-28 DIAGNOSIS — N186 End stage renal disease: Secondary | ICD-10-CM | POA: Diagnosis not present

## 2019-03-02 DIAGNOSIS — N2581 Secondary hyperparathyroidism of renal origin: Secondary | ICD-10-CM | POA: Diagnosis not present

## 2019-03-02 DIAGNOSIS — N186 End stage renal disease: Secondary | ICD-10-CM | POA: Diagnosis not present

## 2019-03-02 DIAGNOSIS — Z992 Dependence on renal dialysis: Secondary | ICD-10-CM | POA: Diagnosis not present

## 2019-03-02 DIAGNOSIS — D509 Iron deficiency anemia, unspecified: Secondary | ICD-10-CM | POA: Diagnosis not present

## 2019-03-05 DIAGNOSIS — N186 End stage renal disease: Secondary | ICD-10-CM | POA: Diagnosis not present

## 2019-03-05 DIAGNOSIS — N2581 Secondary hyperparathyroidism of renal origin: Secondary | ICD-10-CM | POA: Diagnosis not present

## 2019-03-05 DIAGNOSIS — D509 Iron deficiency anemia, unspecified: Secondary | ICD-10-CM | POA: Diagnosis not present

## 2019-03-05 DIAGNOSIS — Z992 Dependence on renal dialysis: Secondary | ICD-10-CM | POA: Diagnosis not present

## 2019-03-07 DIAGNOSIS — N186 End stage renal disease: Secondary | ICD-10-CM | POA: Diagnosis not present

## 2019-03-07 DIAGNOSIS — N2581 Secondary hyperparathyroidism of renal origin: Secondary | ICD-10-CM | POA: Diagnosis not present

## 2019-03-07 DIAGNOSIS — Z992 Dependence on renal dialysis: Secondary | ICD-10-CM | POA: Diagnosis not present

## 2019-03-07 DIAGNOSIS — D509 Iron deficiency anemia, unspecified: Secondary | ICD-10-CM | POA: Diagnosis not present

## 2019-03-11 DIAGNOSIS — N5201 Erectile dysfunction due to arterial insufficiency: Secondary | ICD-10-CM | POA: Diagnosis not present

## 2019-03-12 DIAGNOSIS — N2581 Secondary hyperparathyroidism of renal origin: Secondary | ICD-10-CM | POA: Diagnosis not present

## 2019-03-12 DIAGNOSIS — D631 Anemia in chronic kidney disease: Secondary | ICD-10-CM | POA: Diagnosis not present

## 2019-03-12 DIAGNOSIS — N186 End stage renal disease: Secondary | ICD-10-CM | POA: Diagnosis not present

## 2019-03-12 DIAGNOSIS — Z992 Dependence on renal dialysis: Secondary | ICD-10-CM | POA: Diagnosis not present

## 2019-03-12 DIAGNOSIS — D509 Iron deficiency anemia, unspecified: Secondary | ICD-10-CM | POA: Diagnosis not present

## 2019-03-12 DIAGNOSIS — N2889 Other specified disorders of kidney and ureter: Secondary | ICD-10-CM | POA: Diagnosis not present

## 2019-03-14 DIAGNOSIS — Z992 Dependence on renal dialysis: Secondary | ICD-10-CM | POA: Diagnosis not present

## 2019-03-14 DIAGNOSIS — D631 Anemia in chronic kidney disease: Secondary | ICD-10-CM | POA: Diagnosis not present

## 2019-03-14 DIAGNOSIS — N2581 Secondary hyperparathyroidism of renal origin: Secondary | ICD-10-CM | POA: Diagnosis not present

## 2019-03-14 DIAGNOSIS — D509 Iron deficiency anemia, unspecified: Secondary | ICD-10-CM | POA: Diagnosis not present

## 2019-03-14 DIAGNOSIS — N186 End stage renal disease: Secondary | ICD-10-CM | POA: Diagnosis not present

## 2019-03-19 DIAGNOSIS — N186 End stage renal disease: Secondary | ICD-10-CM | POA: Diagnosis not present

## 2019-03-19 DIAGNOSIS — D509 Iron deficiency anemia, unspecified: Secondary | ICD-10-CM | POA: Diagnosis not present

## 2019-03-19 DIAGNOSIS — Z992 Dependence on renal dialysis: Secondary | ICD-10-CM | POA: Diagnosis not present

## 2019-03-19 DIAGNOSIS — N2581 Secondary hyperparathyroidism of renal origin: Secondary | ICD-10-CM | POA: Diagnosis not present

## 2019-03-19 DIAGNOSIS — D631 Anemia in chronic kidney disease: Secondary | ICD-10-CM | POA: Diagnosis not present

## 2019-03-21 DIAGNOSIS — N186 End stage renal disease: Secondary | ICD-10-CM | POA: Diagnosis not present

## 2019-03-21 DIAGNOSIS — D631 Anemia in chronic kidney disease: Secondary | ICD-10-CM | POA: Diagnosis not present

## 2019-03-21 DIAGNOSIS — Z992 Dependence on renal dialysis: Secondary | ICD-10-CM | POA: Diagnosis not present

## 2019-03-21 DIAGNOSIS — N2581 Secondary hyperparathyroidism of renal origin: Secondary | ICD-10-CM | POA: Diagnosis not present

## 2019-03-21 DIAGNOSIS — D509 Iron deficiency anemia, unspecified: Secondary | ICD-10-CM | POA: Diagnosis not present

## 2019-03-23 DIAGNOSIS — D509 Iron deficiency anemia, unspecified: Secondary | ICD-10-CM | POA: Diagnosis not present

## 2019-03-23 DIAGNOSIS — Z992 Dependence on renal dialysis: Secondary | ICD-10-CM | POA: Diagnosis not present

## 2019-03-23 DIAGNOSIS — N186 End stage renal disease: Secondary | ICD-10-CM | POA: Diagnosis not present

## 2019-03-23 DIAGNOSIS — D631 Anemia in chronic kidney disease: Secondary | ICD-10-CM | POA: Diagnosis not present

## 2019-03-23 DIAGNOSIS — N2581 Secondary hyperparathyroidism of renal origin: Secondary | ICD-10-CM | POA: Diagnosis not present

## 2019-03-26 DIAGNOSIS — N2581 Secondary hyperparathyroidism of renal origin: Secondary | ICD-10-CM | POA: Diagnosis not present

## 2019-03-26 DIAGNOSIS — D631 Anemia in chronic kidney disease: Secondary | ICD-10-CM | POA: Diagnosis not present

## 2019-03-26 DIAGNOSIS — Z992 Dependence on renal dialysis: Secondary | ICD-10-CM | POA: Diagnosis not present

## 2019-03-26 DIAGNOSIS — D509 Iron deficiency anemia, unspecified: Secondary | ICD-10-CM | POA: Diagnosis not present

## 2019-03-26 DIAGNOSIS — N186 End stage renal disease: Secondary | ICD-10-CM | POA: Diagnosis not present

## 2019-03-28 DIAGNOSIS — D509 Iron deficiency anemia, unspecified: Secondary | ICD-10-CM | POA: Diagnosis not present

## 2019-03-28 DIAGNOSIS — Z992 Dependence on renal dialysis: Secondary | ICD-10-CM | POA: Diagnosis not present

## 2019-03-28 DIAGNOSIS — N2581 Secondary hyperparathyroidism of renal origin: Secondary | ICD-10-CM | POA: Diagnosis not present

## 2019-03-28 DIAGNOSIS — N186 End stage renal disease: Secondary | ICD-10-CM | POA: Diagnosis not present

## 2019-03-28 DIAGNOSIS — D631 Anemia in chronic kidney disease: Secondary | ICD-10-CM | POA: Diagnosis not present

## 2019-04-02 DIAGNOSIS — N2581 Secondary hyperparathyroidism of renal origin: Secondary | ICD-10-CM | POA: Diagnosis not present

## 2019-04-02 DIAGNOSIS — Z992 Dependence on renal dialysis: Secondary | ICD-10-CM | POA: Diagnosis not present

## 2019-04-02 DIAGNOSIS — D631 Anemia in chronic kidney disease: Secondary | ICD-10-CM | POA: Diagnosis not present

## 2019-04-02 DIAGNOSIS — D509 Iron deficiency anemia, unspecified: Secondary | ICD-10-CM | POA: Diagnosis not present

## 2019-04-02 DIAGNOSIS — N186 End stage renal disease: Secondary | ICD-10-CM | POA: Diagnosis not present

## 2019-04-04 DIAGNOSIS — Z992 Dependence on renal dialysis: Secondary | ICD-10-CM | POA: Diagnosis not present

## 2019-04-04 DIAGNOSIS — D631 Anemia in chronic kidney disease: Secondary | ICD-10-CM | POA: Diagnosis not present

## 2019-04-04 DIAGNOSIS — N2581 Secondary hyperparathyroidism of renal origin: Secondary | ICD-10-CM | POA: Diagnosis not present

## 2019-04-04 DIAGNOSIS — N186 End stage renal disease: Secondary | ICD-10-CM | POA: Diagnosis not present

## 2019-04-04 DIAGNOSIS — D509 Iron deficiency anemia, unspecified: Secondary | ICD-10-CM | POA: Diagnosis not present

## 2019-04-09 DIAGNOSIS — D509 Iron deficiency anemia, unspecified: Secondary | ICD-10-CM | POA: Diagnosis not present

## 2019-04-09 DIAGNOSIS — Z992 Dependence on renal dialysis: Secondary | ICD-10-CM | POA: Diagnosis not present

## 2019-04-09 DIAGNOSIS — D631 Anemia in chronic kidney disease: Secondary | ICD-10-CM | POA: Diagnosis not present

## 2019-04-09 DIAGNOSIS — N2581 Secondary hyperparathyroidism of renal origin: Secondary | ICD-10-CM | POA: Diagnosis not present

## 2019-04-09 DIAGNOSIS — N186 End stage renal disease: Secondary | ICD-10-CM | POA: Diagnosis not present

## 2019-04-11 DIAGNOSIS — Z992 Dependence on renal dialysis: Secondary | ICD-10-CM | POA: Diagnosis not present

## 2019-04-11 DIAGNOSIS — N2889 Other specified disorders of kidney and ureter: Secondary | ICD-10-CM | POA: Diagnosis not present

## 2019-04-11 DIAGNOSIS — N186 End stage renal disease: Secondary | ICD-10-CM | POA: Diagnosis not present

## 2019-04-13 DIAGNOSIS — D631 Anemia in chronic kidney disease: Secondary | ICD-10-CM | POA: Diagnosis not present

## 2019-04-13 DIAGNOSIS — N2581 Secondary hyperparathyroidism of renal origin: Secondary | ICD-10-CM | POA: Diagnosis not present

## 2019-04-13 DIAGNOSIS — N186 End stage renal disease: Secondary | ICD-10-CM | POA: Diagnosis not present

## 2019-04-13 DIAGNOSIS — T7840XA Allergy, unspecified, initial encounter: Secondary | ICD-10-CM | POA: Diagnosis not present

## 2019-04-13 DIAGNOSIS — D509 Iron deficiency anemia, unspecified: Secondary | ICD-10-CM | POA: Diagnosis not present

## 2019-04-13 DIAGNOSIS — Z992 Dependence on renal dialysis: Secondary | ICD-10-CM | POA: Diagnosis not present

## 2019-04-16 DIAGNOSIS — D509 Iron deficiency anemia, unspecified: Secondary | ICD-10-CM | POA: Diagnosis not present

## 2019-04-16 DIAGNOSIS — D631 Anemia in chronic kidney disease: Secondary | ICD-10-CM | POA: Diagnosis not present

## 2019-04-16 DIAGNOSIS — N2581 Secondary hyperparathyroidism of renal origin: Secondary | ICD-10-CM | POA: Diagnosis not present

## 2019-04-16 DIAGNOSIS — N186 End stage renal disease: Secondary | ICD-10-CM | POA: Diagnosis not present

## 2019-04-16 DIAGNOSIS — T7840XA Allergy, unspecified, initial encounter: Secondary | ICD-10-CM | POA: Diagnosis not present

## 2019-04-16 DIAGNOSIS — Z992 Dependence on renal dialysis: Secondary | ICD-10-CM | POA: Diagnosis not present

## 2019-04-18 DIAGNOSIS — D631 Anemia in chronic kidney disease: Secondary | ICD-10-CM | POA: Diagnosis not present

## 2019-04-18 DIAGNOSIS — N2581 Secondary hyperparathyroidism of renal origin: Secondary | ICD-10-CM | POA: Diagnosis not present

## 2019-04-18 DIAGNOSIS — D509 Iron deficiency anemia, unspecified: Secondary | ICD-10-CM | POA: Diagnosis not present

## 2019-04-18 DIAGNOSIS — N186 End stage renal disease: Secondary | ICD-10-CM | POA: Diagnosis not present

## 2019-04-18 DIAGNOSIS — Z992 Dependence on renal dialysis: Secondary | ICD-10-CM | POA: Diagnosis not present

## 2019-04-18 DIAGNOSIS — T7840XA Allergy, unspecified, initial encounter: Secondary | ICD-10-CM | POA: Diagnosis not present

## 2019-04-23 DIAGNOSIS — N186 End stage renal disease: Secondary | ICD-10-CM | POA: Diagnosis not present

## 2019-04-23 DIAGNOSIS — D631 Anemia in chronic kidney disease: Secondary | ICD-10-CM | POA: Diagnosis not present

## 2019-04-23 DIAGNOSIS — N2581 Secondary hyperparathyroidism of renal origin: Secondary | ICD-10-CM | POA: Diagnosis not present

## 2019-04-23 DIAGNOSIS — Z992 Dependence on renal dialysis: Secondary | ICD-10-CM | POA: Diagnosis not present

## 2019-04-23 DIAGNOSIS — T7840XA Allergy, unspecified, initial encounter: Secondary | ICD-10-CM | POA: Diagnosis not present

## 2019-04-23 DIAGNOSIS — D509 Iron deficiency anemia, unspecified: Secondary | ICD-10-CM | POA: Diagnosis not present

## 2019-04-25 DIAGNOSIS — N2581 Secondary hyperparathyroidism of renal origin: Secondary | ICD-10-CM | POA: Diagnosis not present

## 2019-04-25 DIAGNOSIS — T7840XA Allergy, unspecified, initial encounter: Secondary | ICD-10-CM | POA: Diagnosis not present

## 2019-04-25 DIAGNOSIS — N186 End stage renal disease: Secondary | ICD-10-CM | POA: Diagnosis not present

## 2019-04-25 DIAGNOSIS — D509 Iron deficiency anemia, unspecified: Secondary | ICD-10-CM | POA: Diagnosis not present

## 2019-04-25 DIAGNOSIS — Z992 Dependence on renal dialysis: Secondary | ICD-10-CM | POA: Diagnosis not present

## 2019-04-25 DIAGNOSIS — D631 Anemia in chronic kidney disease: Secondary | ICD-10-CM | POA: Diagnosis not present

## 2019-04-29 DIAGNOSIS — T7840XA Allergy, unspecified, initial encounter: Secondary | ICD-10-CM | POA: Insufficient documentation

## 2019-04-30 DIAGNOSIS — D509 Iron deficiency anemia, unspecified: Secondary | ICD-10-CM | POA: Diagnosis not present

## 2019-04-30 DIAGNOSIS — Z992 Dependence on renal dialysis: Secondary | ICD-10-CM | POA: Diagnosis not present

## 2019-04-30 DIAGNOSIS — D631 Anemia in chronic kidney disease: Secondary | ICD-10-CM | POA: Diagnosis not present

## 2019-04-30 DIAGNOSIS — T7840XA Allergy, unspecified, initial encounter: Secondary | ICD-10-CM | POA: Diagnosis not present

## 2019-04-30 DIAGNOSIS — N186 End stage renal disease: Secondary | ICD-10-CM | POA: Diagnosis not present

## 2019-04-30 DIAGNOSIS — N2581 Secondary hyperparathyroidism of renal origin: Secondary | ICD-10-CM | POA: Diagnosis not present

## 2019-05-02 DIAGNOSIS — D509 Iron deficiency anemia, unspecified: Secondary | ICD-10-CM | POA: Diagnosis not present

## 2019-05-02 DIAGNOSIS — N186 End stage renal disease: Secondary | ICD-10-CM | POA: Diagnosis not present

## 2019-05-02 DIAGNOSIS — Z992 Dependence on renal dialysis: Secondary | ICD-10-CM | POA: Diagnosis not present

## 2019-05-02 DIAGNOSIS — D631 Anemia in chronic kidney disease: Secondary | ICD-10-CM | POA: Diagnosis not present

## 2019-05-02 DIAGNOSIS — T7840XA Allergy, unspecified, initial encounter: Secondary | ICD-10-CM | POA: Diagnosis not present

## 2019-05-02 DIAGNOSIS — N2581 Secondary hyperparathyroidism of renal origin: Secondary | ICD-10-CM | POA: Diagnosis not present

## 2019-05-04 DIAGNOSIS — D509 Iron deficiency anemia, unspecified: Secondary | ICD-10-CM | POA: Diagnosis not present

## 2019-05-04 DIAGNOSIS — Z992 Dependence on renal dialysis: Secondary | ICD-10-CM | POA: Diagnosis not present

## 2019-05-04 DIAGNOSIS — N186 End stage renal disease: Secondary | ICD-10-CM | POA: Diagnosis not present

## 2019-05-04 DIAGNOSIS — N2581 Secondary hyperparathyroidism of renal origin: Secondary | ICD-10-CM | POA: Diagnosis not present

## 2019-05-04 DIAGNOSIS — D631 Anemia in chronic kidney disease: Secondary | ICD-10-CM | POA: Diagnosis not present

## 2019-05-04 DIAGNOSIS — T7840XA Allergy, unspecified, initial encounter: Secondary | ICD-10-CM | POA: Diagnosis not present

## 2019-05-07 DIAGNOSIS — Z992 Dependence on renal dialysis: Secondary | ICD-10-CM | POA: Diagnosis not present

## 2019-05-07 DIAGNOSIS — T7840XA Allergy, unspecified, initial encounter: Secondary | ICD-10-CM | POA: Diagnosis not present

## 2019-05-07 DIAGNOSIS — D631 Anemia in chronic kidney disease: Secondary | ICD-10-CM | POA: Diagnosis not present

## 2019-05-07 DIAGNOSIS — D509 Iron deficiency anemia, unspecified: Secondary | ICD-10-CM | POA: Diagnosis not present

## 2019-05-07 DIAGNOSIS — N186 End stage renal disease: Secondary | ICD-10-CM | POA: Diagnosis not present

## 2019-05-07 DIAGNOSIS — N2581 Secondary hyperparathyroidism of renal origin: Secondary | ICD-10-CM | POA: Diagnosis not present

## 2019-05-09 DIAGNOSIS — Z992 Dependence on renal dialysis: Secondary | ICD-10-CM | POA: Diagnosis not present

## 2019-05-09 DIAGNOSIS — D509 Iron deficiency anemia, unspecified: Secondary | ICD-10-CM | POA: Diagnosis not present

## 2019-05-09 DIAGNOSIS — T7840XA Allergy, unspecified, initial encounter: Secondary | ICD-10-CM | POA: Diagnosis not present

## 2019-05-09 DIAGNOSIS — N2581 Secondary hyperparathyroidism of renal origin: Secondary | ICD-10-CM | POA: Diagnosis not present

## 2019-05-09 DIAGNOSIS — D631 Anemia in chronic kidney disease: Secondary | ICD-10-CM | POA: Diagnosis not present

## 2019-05-09 DIAGNOSIS — N186 End stage renal disease: Secondary | ICD-10-CM | POA: Diagnosis not present

## 2019-05-12 DIAGNOSIS — N186 End stage renal disease: Secondary | ICD-10-CM | POA: Diagnosis not present

## 2019-05-12 DIAGNOSIS — Z992 Dependence on renal dialysis: Secondary | ICD-10-CM | POA: Diagnosis not present

## 2019-05-12 DIAGNOSIS — N2889 Other specified disorders of kidney and ureter: Secondary | ICD-10-CM | POA: Diagnosis not present

## 2019-05-14 DIAGNOSIS — Z992 Dependence on renal dialysis: Secondary | ICD-10-CM | POA: Diagnosis not present

## 2019-05-14 DIAGNOSIS — D509 Iron deficiency anemia, unspecified: Secondary | ICD-10-CM | POA: Diagnosis not present

## 2019-05-14 DIAGNOSIS — N2581 Secondary hyperparathyroidism of renal origin: Secondary | ICD-10-CM | POA: Diagnosis not present

## 2019-05-14 DIAGNOSIS — N186 End stage renal disease: Secondary | ICD-10-CM | POA: Diagnosis not present

## 2019-05-16 DIAGNOSIS — Z992 Dependence on renal dialysis: Secondary | ICD-10-CM | POA: Diagnosis not present

## 2019-05-16 DIAGNOSIS — D509 Iron deficiency anemia, unspecified: Secondary | ICD-10-CM | POA: Diagnosis not present

## 2019-05-16 DIAGNOSIS — N2581 Secondary hyperparathyroidism of renal origin: Secondary | ICD-10-CM | POA: Diagnosis not present

## 2019-05-16 DIAGNOSIS — N186 End stage renal disease: Secondary | ICD-10-CM | POA: Diagnosis not present

## 2019-05-21 DIAGNOSIS — D509 Iron deficiency anemia, unspecified: Secondary | ICD-10-CM | POA: Diagnosis not present

## 2019-05-21 DIAGNOSIS — Z992 Dependence on renal dialysis: Secondary | ICD-10-CM | POA: Diagnosis not present

## 2019-05-21 DIAGNOSIS — N2581 Secondary hyperparathyroidism of renal origin: Secondary | ICD-10-CM | POA: Diagnosis not present

## 2019-05-21 DIAGNOSIS — N186 End stage renal disease: Secondary | ICD-10-CM | POA: Diagnosis not present

## 2019-05-28 ENCOUNTER — Emergency Department (HOSPITAL_COMMUNITY)
Admission: EM | Admit: 2019-05-28 | Discharge: 2019-05-28 | Disposition: A | Discharge status: Home / Self Care | Payer: Medicare Other | Attending: Emergency Medicine | Admitting: Emergency Medicine

## 2019-05-28 ENCOUNTER — Other Ambulatory Visit: Payer: Self-pay

## 2019-05-28 ENCOUNTER — Emergency Department (HOSPITAL_COMMUNITY): Payer: Medicare Other

## 2019-05-28 ENCOUNTER — Encounter (HOSPITAL_COMMUNITY): Payer: Self-pay | Admitting: Emergency Medicine

## 2019-05-28 DIAGNOSIS — F17210 Nicotine dependence, cigarettes, uncomplicated: Secondary | ICD-10-CM | POA: Diagnosis not present

## 2019-05-28 DIAGNOSIS — I120 Hypertensive chronic kidney disease with stage 5 chronic kidney disease or end stage renal disease: Secondary | ICD-10-CM | POA: Insufficient documentation

## 2019-05-28 DIAGNOSIS — I2510 Atherosclerotic heart disease of native coronary artery without angina pectoris: Secondary | ICD-10-CM | POA: Insufficient documentation

## 2019-05-28 DIAGNOSIS — N186 End stage renal disease: Secondary | ICD-10-CM | POA: Diagnosis not present

## 2019-05-28 DIAGNOSIS — D509 Iron deficiency anemia, unspecified: Secondary | ICD-10-CM | POA: Diagnosis not present

## 2019-05-28 DIAGNOSIS — Z992 Dependence on renal dialysis: Secondary | ICD-10-CM | POA: Insufficient documentation

## 2019-05-28 DIAGNOSIS — R4020 Unspecified coma: Secondary | ICD-10-CM | POA: Diagnosis not present

## 2019-05-28 DIAGNOSIS — G9340 Encephalopathy, unspecified: Secondary | ICD-10-CM | POA: Insufficient documentation

## 2019-05-28 DIAGNOSIS — R4182 Altered mental status, unspecified: Secondary | ICD-10-CM | POA: Diagnosis not present

## 2019-05-28 DIAGNOSIS — N2581 Secondary hyperparathyroidism of renal origin: Secondary | ICD-10-CM | POA: Diagnosis not present

## 2019-05-28 DIAGNOSIS — E162 Hypoglycemia, unspecified: Secondary | ICD-10-CM | POA: Insufficient documentation

## 2019-05-28 DIAGNOSIS — Z79899 Other long term (current) drug therapy: Secondary | ICD-10-CM | POA: Diagnosis not present

## 2019-05-28 DIAGNOSIS — I10 Essential (primary) hypertension: Secondary | ICD-10-CM | POA: Diagnosis not present

## 2019-05-28 DIAGNOSIS — J449 Chronic obstructive pulmonary disease, unspecified: Secondary | ICD-10-CM | POA: Insufficient documentation

## 2019-05-28 DIAGNOSIS — R52 Pain, unspecified: Secondary | ICD-10-CM | POA: Diagnosis not present

## 2019-05-28 DIAGNOSIS — R531 Weakness: Secondary | ICD-10-CM | POA: Diagnosis not present

## 2019-05-28 DIAGNOSIS — I499 Cardiac arrhythmia, unspecified: Secondary | ICD-10-CM | POA: Diagnosis not present

## 2019-05-28 DIAGNOSIS — E11649 Type 2 diabetes mellitus with hypoglycemia without coma: Secondary | ICD-10-CM | POA: Diagnosis not present

## 2019-05-28 DIAGNOSIS — R404 Transient alteration of awareness: Secondary | ICD-10-CM | POA: Diagnosis not present

## 2019-05-28 LAB — POCT I-STAT EG7
Acid-Base Excess: 4 mmol/L — ABNORMAL HIGH (ref 0.0–2.0)
Bicarbonate: 28.8 mmol/L — ABNORMAL HIGH (ref 20.0–28.0)
Calcium, Ion: 0.99 mmol/L — ABNORMAL LOW (ref 1.15–1.40)
HCT: 34 % — ABNORMAL LOW (ref 39.0–52.0)
Hemoglobin: 11.6 g/dL — ABNORMAL LOW (ref 13.0–17.0)
O2 Saturation: 49 %
Potassium: 3.6 mmol/L (ref 3.5–5.1)
Sodium: 138 mmol/L (ref 135–145)
TCO2: 30 mmol/L (ref 22–32)
pCO2, Ven: 44.2 mmHg (ref 44.0–60.0)
pH, Ven: 7.422 (ref 7.250–7.430)
pO2, Ven: 26 mmHg — CL (ref 32.0–45.0)

## 2019-05-28 LAB — COMPREHENSIVE METABOLIC PANEL
ALT: 15 U/L (ref 0–44)
AST: 14 U/L — ABNORMAL LOW (ref 15–41)
Albumin: 3.4 g/dL — ABNORMAL LOW (ref 3.5–5.0)
Alkaline Phosphatase: 61 U/L (ref 38–126)
Anion gap: 15 (ref 5–15)
BUN: 40 mg/dL — ABNORMAL HIGH (ref 8–23)
CO2: 27 mmol/L (ref 22–32)
Calcium: 8.4 mg/dL — ABNORMAL LOW (ref 8.9–10.3)
Chloride: 97 mmol/L — ABNORMAL LOW (ref 98–111)
Creatinine, Ser: 10.76 mg/dL — ABNORMAL HIGH (ref 0.61–1.24)
GFR calc Af Amer: 5 mL/min — ABNORMAL LOW (ref >60)
GFR calc non Af Amer: 4 mL/min — ABNORMAL LOW (ref >60)
Glucose, Bld: 78 mg/dL (ref 70–99)
Potassium: 3.6 mmol/L (ref 3.5–5.1)
Sodium: 139 mmol/L (ref 135–145)
Total Bilirubin: 1 mg/dL (ref 0.3–1.2)
Total Protein: 7.3 g/dL (ref 6.5–8.1)

## 2019-05-28 LAB — CBC WITH DIFFERENTIAL/PLATELET
Abs Immature Granulocytes: 0.01 10*3/uL (ref 0.00–0.07)
Basophils Absolute: 0 10*3/uL (ref 0.0–0.1)
Basophils Relative: 0 %
Eosinophils Absolute: 0.1 10*3/uL (ref 0.0–0.5)
Eosinophils Relative: 2 %
HCT: 34.7 % — ABNORMAL LOW (ref 39.0–52.0)
Hemoglobin: 10.8 g/dL — ABNORMAL LOW (ref 13.0–17.0)
Immature Granulocytes: 0 %
Lymphocytes Relative: 16 %
Lymphs Abs: 0.5 10*3/uL — ABNORMAL LOW (ref 0.7–4.0)
MCH: 32.3 pg (ref 26.0–34.0)
MCHC: 31.1 g/dL (ref 30.0–36.0)
MCV: 103.9 fL — ABNORMAL HIGH (ref 80.0–100.0)
Monocytes Absolute: 0.5 10*3/uL (ref 0.1–1.0)
Monocytes Relative: 16 %
Neutro Abs: 1.9 10*3/uL (ref 1.7–7.7)
Neutrophils Relative %: 66 %
Platelets: 99 10*3/uL — ABNORMAL LOW (ref 150–400)
RBC: 3.34 MIL/uL — ABNORMAL LOW (ref 4.22–5.81)
RDW: 15.4 % (ref 11.5–15.5)
WBC: 2.9 10*3/uL — ABNORMAL LOW (ref 4.0–10.5)
nRBC: 0 % (ref 0.0–0.2)

## 2019-05-28 LAB — CBG MONITORING, ED
Glucose-Capillary: 60 mg/dL — ABNORMAL LOW (ref 70–99)
Glucose-Capillary: 104 mg/dL — ABNORMAL HIGH (ref 70–99)
Glucose-Capillary: 97 mg/dL (ref 70–99)
Glucose-Capillary: 99 mg/dL (ref 70–99)

## 2019-05-28 LAB — TROPONIN I (HIGH SENSITIVITY)
Troponin I (High Sensitivity): 22 ng/L — ABNORMAL HIGH (ref <18)
Troponin I (High Sensitivity): 21 ng/L — ABNORMAL HIGH (ref <18)

## 2019-05-28 LAB — AMMONIA: Ammonia: 12 umol/L (ref 9–35)

## 2019-05-28 LAB — LACTIC ACID, PLASMA: Lactic Acid, Venous: 1.2 mmol/L (ref 0.5–1.9)

## 2019-05-28 LAB — MAGNESIUM: Magnesium: 2.1 mg/dL (ref 1.7–2.4)

## 2019-05-28 LAB — ETHANOL: Alcohol, Ethyl (B): <10 mg/dL (ref <10)

## 2019-05-28 IMAGING — CT CT HEAD W/O CM
4 series · 16 of 47 positions shown, 18 images · non-contrast
Comparison: [DATE]

CLINICAL DATA: Altered mental status

EXAM:
CT HEAD WITHOUT CONTRAST
TECHNIQUE: Contiguous axial images were obtained from the base of the skull
through the vertex without intravenous contrast.

[Series 2: head without · axial · non-contrast · 0.46mm/px · z∈[-364,-254]mm · 7 of 30 slices shown, 9 images]
[im 4/30  brain]
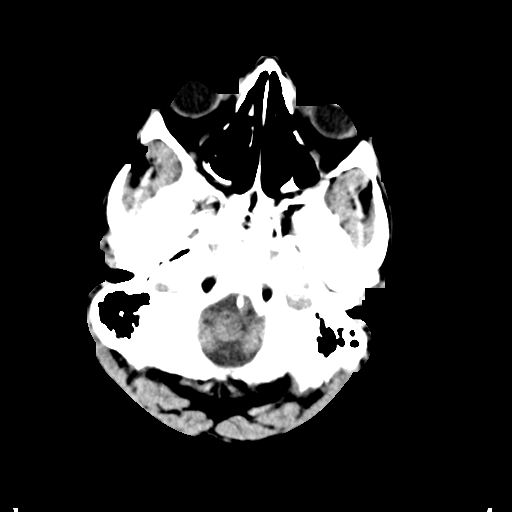
[im 4/30  bone]
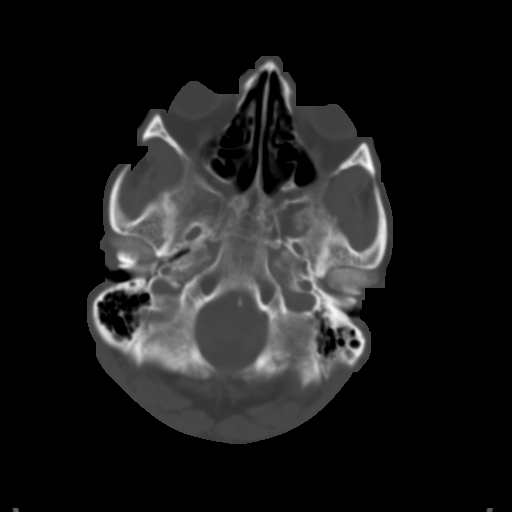
[im 8/30  brain]
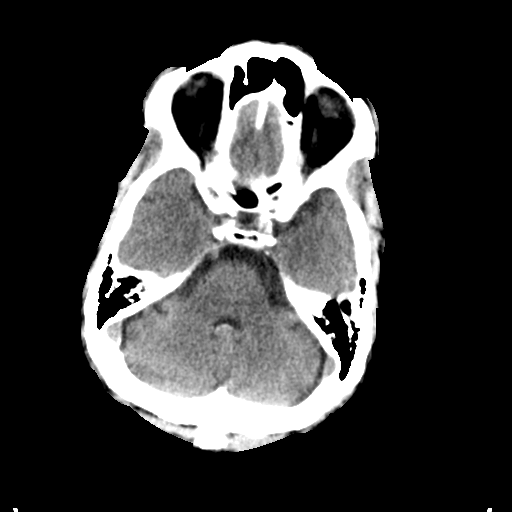
[im 11/30  brain]
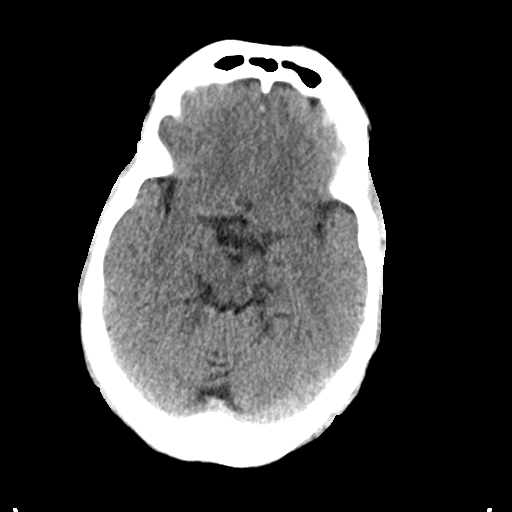
[im 15/30  brain]
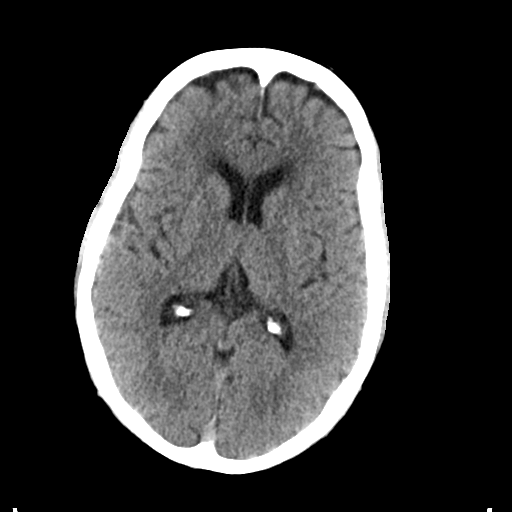
[im 19/30  brain]
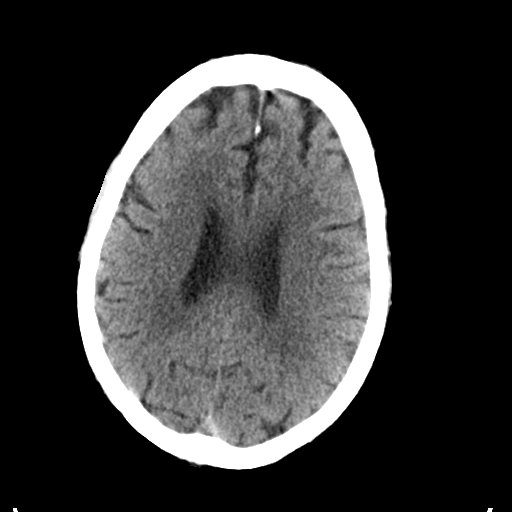
[im 19/30  bone]
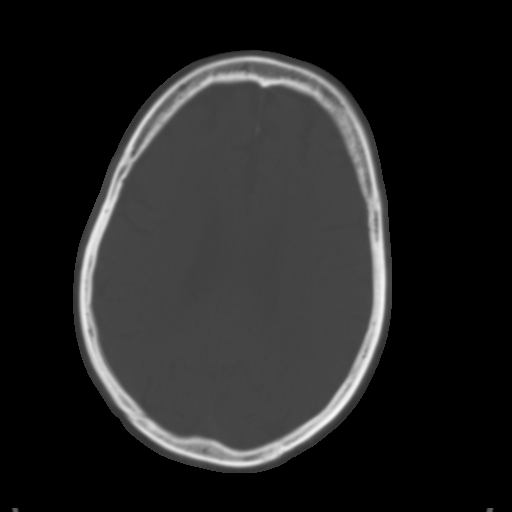
[im 22/30  brain]
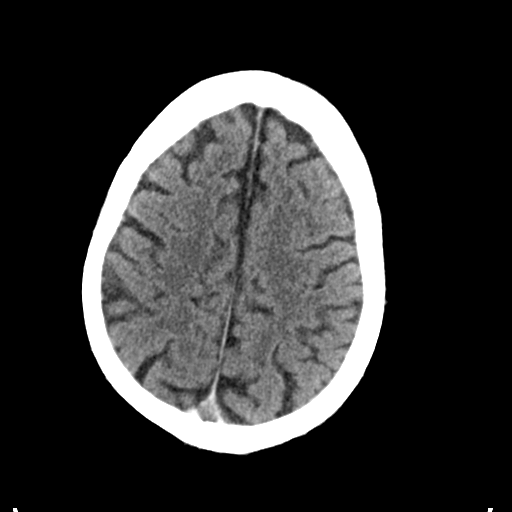
[im 26/30  brain]
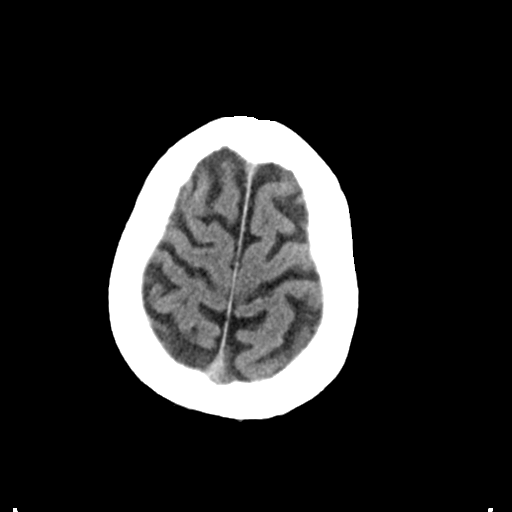

[Series 3: head bone · axial · 0.46mm/px · z∈[-364,-334]mm · 3 of 75 slices shown]
[im 8/75  bone]
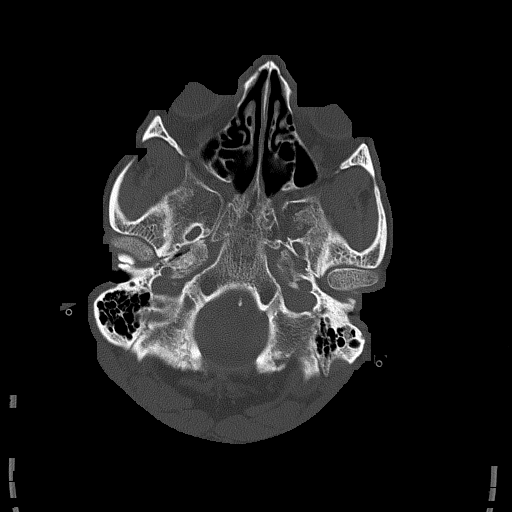
[im 15/75  bone]
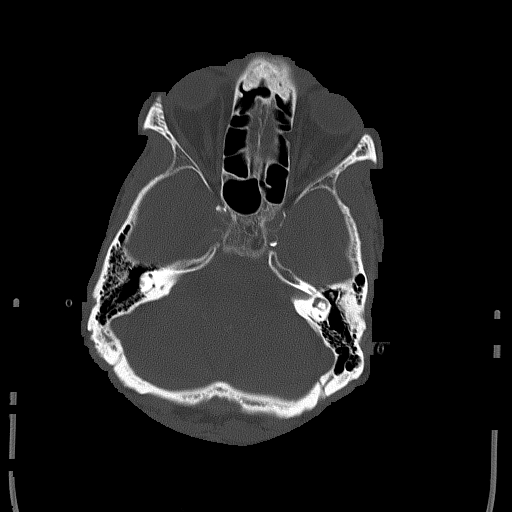
[im 23/75  bone]
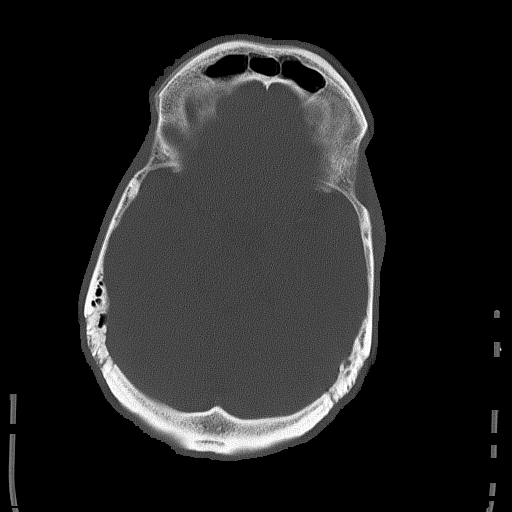

[Series 4: head without cor · coronal · non-contrast · 0.31mm/px · 3 of 75 slices shown]
[im 25/75  brain]
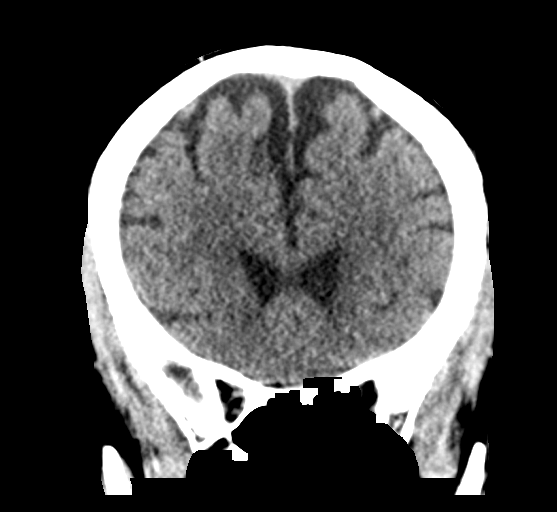
[im 33/75  brain]
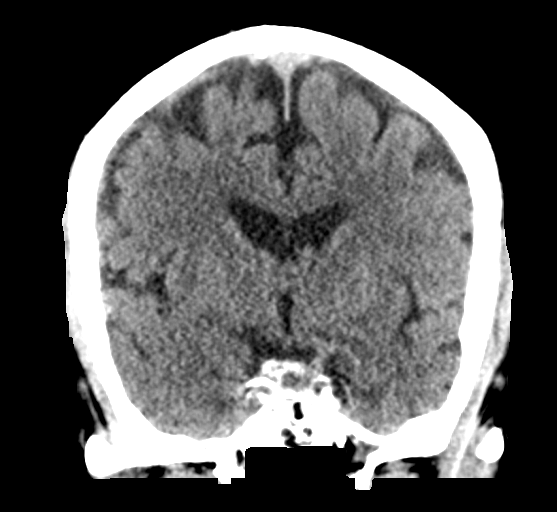
[im 42/75  brain]
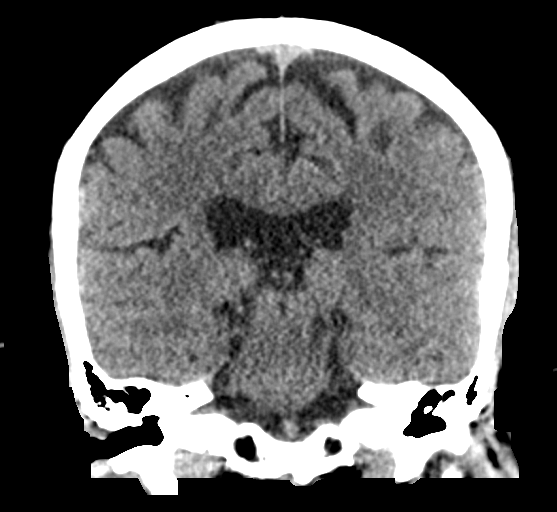

[Series 5: head without sag · sagittal · non-contrast · 0.32mm/px · 3 of 60 slices shown]
[im 20/60  brain]
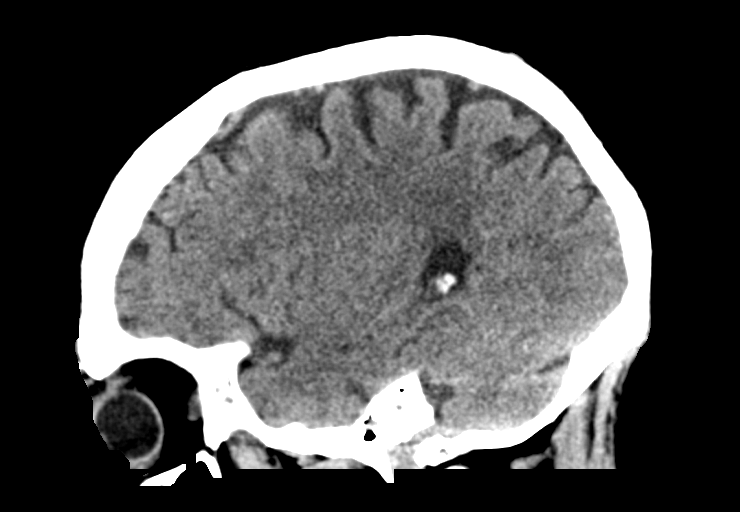
[im 30/60  brain]
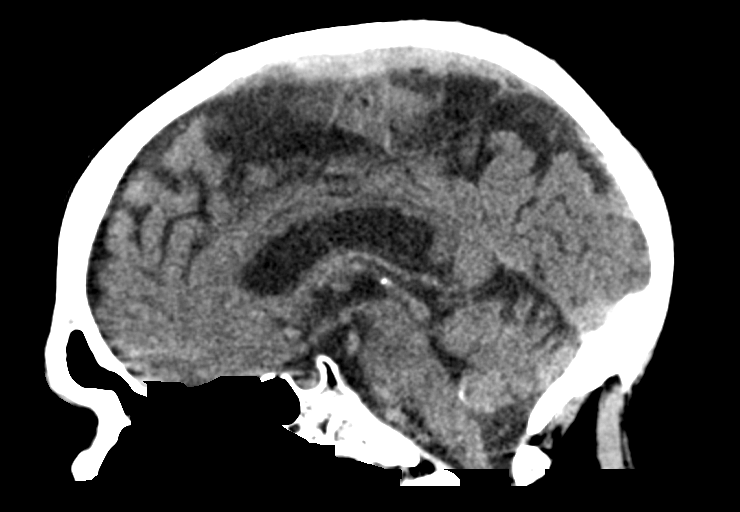
[im 40/60  brain]
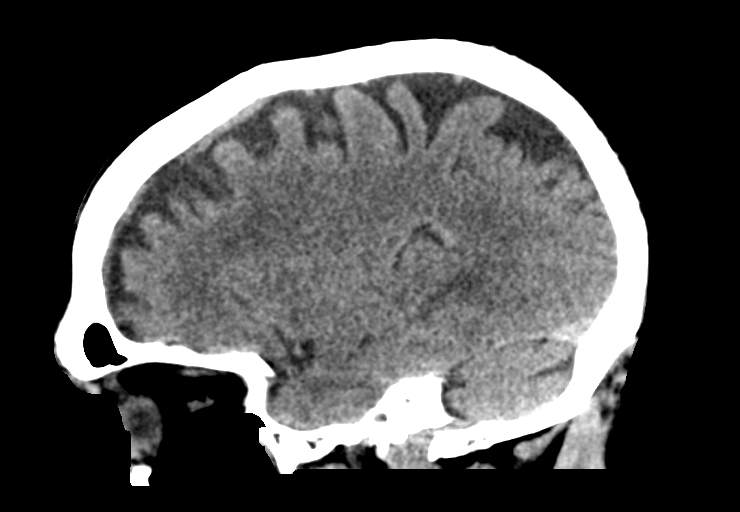

[16 of 47 positions shown; findings below may reference images not displayed]

FINDINGS: Brain: No evidence of acute infarction, hemorrhage, hydrocephalus,
extra-axial collection or mass lesion/mass effect. Scattered
low-density changes within the periventricular and subcortical white
matter compatible with chronic microvascular ischemic change. Mild
diffuse cerebral volume loss.

Vascular: Mild atherosclerotic calcifications involving the large
vessels of the skull base. No unexpected hyperdense vessel.

Skull: No acute fracture.

Sinuses/Orbits: No acute findings.

Other: None.
IMPRESSION: No acute intracranial findings.

## 2019-05-28 IMAGING — DX DG CHEST 1V PORT
1 series · 1 of 1 positions shown · non-contrast
Comparison: [DATE]

CLINICAL DATA: Altered LOC

EXAM:
PORTABLE CHEST 1 VIEW

[chest]
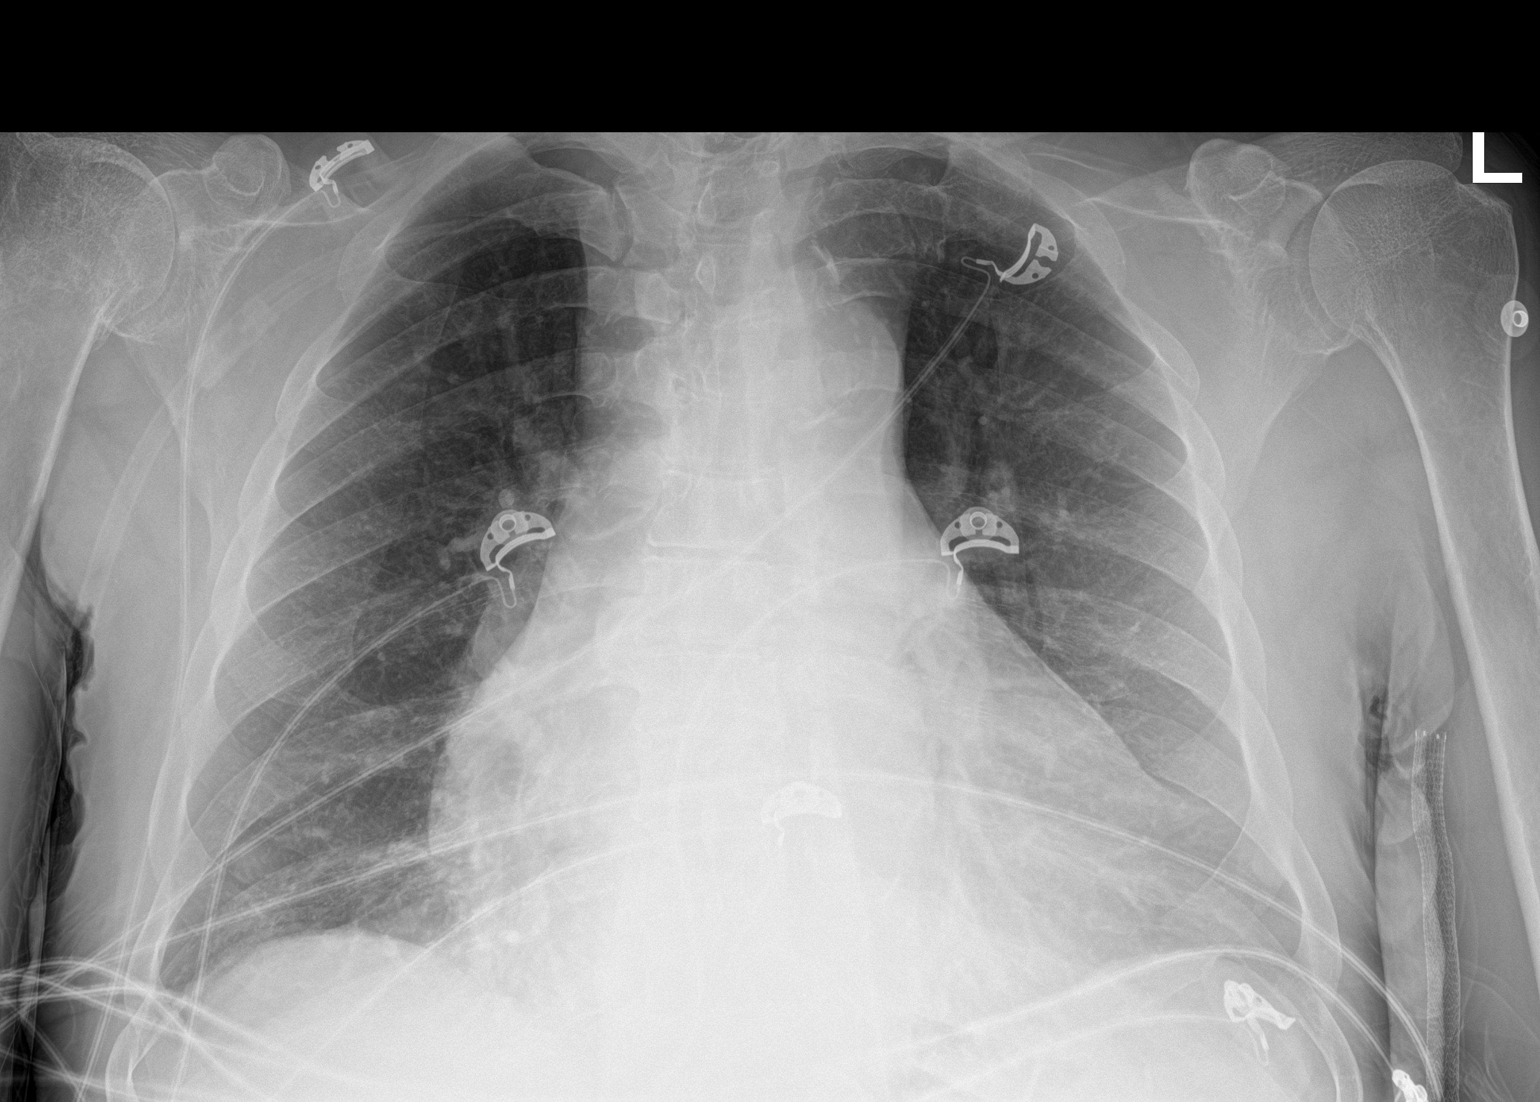

[1 of 1 positions shown; findings below may reference images not displayed]

FINDINGS: Cardiomegaly, increased compared to prior. No pleural effusion,
focal airspace disease or pneumothorax. Aortic atherosclerosis.
IMPRESSION: No active disease.  Cardiomegaly, increased compared to prior.

## 2019-05-28 MED ORDER — ACETAMINOPHEN 325 MG PO TABS
650.0000 mg | ORAL_TABLET | Freq: Once | ORAL | Status: AC
  Administered 2019-05-28: 650 mg via ORAL
  Filled 2019-05-28: qty 2

## 2019-05-28 MED ORDER — DEXTROSE 50 % IV SOLN
INTRAVENOUS | Status: AC
  Administered 2019-05-28: 50 mL
  Filled 2019-05-28: qty 50

## 2019-05-28 NOTE — ED Notes (Signed)
CBG Results of 99 reported to [place], RN.

## 2019-05-28 NOTE — ED Notes (Signed)
Hemodialysis called to assess and removed dialysis clamp per protocol

## 2019-05-28 NOTE — ED Triage Notes (Addendum)
Pt here by EMS from dialysis. He has missed 2 dialysis, today he did not complete the last 63mins of his appointment as the staff states he became altered. Also reporting left flank pain  PT is Alert to self, disoriented to date and time  Pt arrived with dialysis clamp to left fistula

## 2019-05-28 NOTE — ED Notes (Signed)
CBG Results of 104 reported to [place], Therapist, sports.

## 2019-05-28 NOTE — Discharge Instructions (Signed)
Your work-up today was okay except for low blood sugar which was treated.  There were no complications related to your dialysis.  Please go to dialysis in 2 days.  Return to the emergency department if you have any worsening symptoms or other concerns.

## 2019-05-28 NOTE — ED Notes (Signed)
Patient verbalizes understanding of discharge instructions. Opportunity for questioning and answers were provided. Armband removed by staff, pt discharged from ED. Pt. ambulatory and discharged home.  

## 2019-05-28 NOTE — ED Provider Notes (Signed)
[facility] EMERGENCY DEPARTMENT Provider Note   CSN: [id number] Arrival date & time: 05/28/19  1724     History   Chief Complaint Chief Complaint  Patient presents with  . Altered Mental Status    HPI Raymond [name] is a 63 y.o. male.     Patient with history of end-stage renal disease on dialysis with history of poor compliance, history of COPD, coronary artery disease, hep C, substance abuse, chronic anemia --presents to the emergency department today by EMS from his dialysis center after completing approximately 3 hours and 40 minutes of a 4-hour dialysis session.  Patient became acutely altered and lethargic during the session.  He is oriented to person but not time place and event.  He did complain of some left flank pain at times.  No strokelike symptoms.  No reported chest pain or shortness of breath.  No abdominal pain or diarrhea reported.  Level 5 caveat due to altered mental status.  No treatments of AMS prior to arrival.  Patient arrives with a clamp to his left upper extremity fistula.  Patient hypertensive with EMS.     Past Medical History:  Diagnosis Date  . Anemia   . CAD (coronary artery disease)    a. 03/2018: cath showing 20 to 30% stenosis along the LAD, luminal irregularities along the RCA, and angiographically normal LCx  . COPD (chronic obstructive pulmonary disease) ([place])   . Dyspnea    "with too much fluid"  . ESRD (end stage renal disease) on dialysis [facility])    "MWF; Jeneen Rinks" (07/22/17)  . Hemodialysis patient ([place])   . Hepatitis C    Still positive s/p liver biopsy at [facility]  and interferon therapy for 6 months. Most recent lab work was on 10/24/12  . Hepatitis C    "took the tx; gone now" (12/05/2016)  Was treated  . History of blood transfusion ~ 2012/2013   "related to my kidneys; blood was low"  . Hypertension   . Substance abuse ([place])   . Thyroid disease     Patient Active Problem List   Diagnosis Date Noted  .  Gastroenteritis 12/24/2018  . Substance use disorder 12/24/2018  . Noncompliance of patient with renal dialysis ([place]) 09/12/2018  . Benign neoplasm of colon   . Melena   . Anemia due to chronic kidney disease, on chronic dialysis ([place])   . Acute on chronic anemia   . H/O medication noncompliance   . Polysubstance (excluding opioids) dependence ([place])   . Atrial flutter with rapid ventricular response ([place]) 08/10/2018  . PAF (paroxysmal atrial fibrillation) ([place]) 08/01/2018  . Acute respiratory failure with hypoxia ([place]) 07/13/2018  . Non-compliance with renal dialysis ([name]) 07/13/2018  . PAD (peripheral artery disease) ([place]) 04/11/2018  . Sepsis ([place]) 03/31/2018  . Preop cardiovascular exam 03/29/2018  . Volume overload 08/07/2017  . Acute pulmonary edema (HCC)   . Dyspnea 06/20/2017  . Acute bronchitis   . COPD (chronic obstructive pulmonary disease) ([place])   . End-stage renal disease on hemodialysis ([place])   . Hypoxia 12/05/2016  . Hyperkalemia 12/19/2015  . Hypoglycemia 12/19/2015  . Polysubstance abuse ([place]) 12/19/2015  . Homeless 12/19/2015  . Anemia associated with stage 5 chronic renal failure ([place]) 12/19/2015  . Atypical chest pain 12/19/2013  . Atherosclerosis of native arteries of extremity with intermittent claudication ([place]) 12/04/2012  . ESRD (end stage renal disease) on dialysis ([place]) 12/04/2012  . HEPATITIS C 09/07/2006  . HYPERCHOLESTEROLEMIA 09/07/2006  .  HYPERTENSION, BENIGN SYSTEMIC 09/07/2006    Past Surgical History:  Procedure Laterality Date  . ABDOMINAL AORTOGRAM W/LOWER EXTREMITY N/A 02/08/2018   Procedure: ABDOMINAL AORTOGRAM W/LOWER EXTREMITY;  Surgeon: Conrad Cherryvale, MD;  Location: [place] CV [facility];  Service: Cardiovascular;  Laterality: N/A;  . AV FISTULA PLACEMENT Left Aug. 2013 ?  . COLONOSCOPY WITH PROPOFOL N/A 08/21/2018   Procedure: COLONOSCOPY WITH PROPOFOL;  Surgeon: Yetta Flock, MD;  Location: [place];  Service:  Gastroenterology;  Laterality: N/A;  . ESOPHAGOGASTRODUODENOSCOPY (EGD) WITH PROPOFOL N/A 08/20/2018   Procedure: ESOPHAGOGASTRODUODENOSCOPY (EGD) WITH PROPOFOL;  Surgeon: Gatha Mayer, MD;  Location: [place];  Service: Endoscopy;  Laterality: N/A;  . FEMORAL-POPLITEAL BYPASS GRAFT Left 04/11/2018   Procedure: BYPASS GRAFT FEMORAL-POPLITEAL ARTERY LEFT LEG;  Surgeon: Rosetta Posner, MD;  Location: [place];  Service: Vascular;  Laterality: Left;  . HOT HEMOSTASIS N/A 08/20/2018   Procedure: HOT HEMOSTASIS (ARGON PLASMA COAGULATION/BICAP);  Surgeon: Gatha Mayer, MD;  Location: [facility] ENDOSCOPY;  Service: Endoscopy;  Laterality: N/A;  . LEFT HEART CATH AND CORONARY ANGIOGRAPHY N/A 03/29/2018   Procedure: LEFT HEART CATH AND CORONARY ANGIOGRAPHY;  Surgeon: Nelva Bush, MD;  Location: [place] CV LAB;  Service: Cardiovascular;  Laterality: N/A;  . LIVER BIOPSY    . ORIF ULNAR FRACTURE Left 05/18/2017   Procedure: OPEN REDUCTION INTERNAL FIXATION (ORIF) ULNAR FRACTURE;  Surgeon: Iran Planas, MD;  Location: [place];  Service: Orthopedics;  Laterality: Left;  . POLYPECTOMY  08/21/2018   Procedure: POLYPECTOMY;  Surgeon: Yetta Flock, MD;  Location: [name] ENDOSCOPY;  Service: Gastroenterology;;  . SHUNTOGRAM N/A 12/11/2012   Procedure: Earney Mallet;  Surgeon: Serafina Mitchell, MD;  Location: [facility] CATH LAB;  Service: Cardiovascular;  Laterality: N/A;        Home Medications    Prior to Admission medications   Medication Sig Start Date End Date Taking? Authorizing Provider  amiodarone (PACERONE) 200 MG tablet Take 1 tablet (200 mg total) by mouth daily. 09/14/18   Monica Becton, MD  bismuth subsalicylate (PEPTO BISMOL) 262 MG chewable tablet Chew 524 mg by mouth as needed for indigestion or diarrhea or loose stools.    [provider]  calcitRIOL (ROCALTROL) 0.5 MCG capsule Take 3 capsules (1.5 mcg total) by mouth 3 (three) times a week. 09/17/18   Monica Becton, MD  metoprolol  succinate (TOPROL-XL) 50 MG 24 hr tablet Take 1 tablet (50 mg total) by mouth daily for 30 days. Take with or immediately following a meal. 09/14/18 12/24/18  Monica Becton, MD  sevelamer carbonate (RENVELA) 800 MG tablet Take 4 tablets (3,200 mg total) by mouth 3 (three) times daily with meals. Patient taking differently: Take 4,800 mg by mouth 3 (three) times daily with meals. Six tablets with each meal 09/14/18   Monica Becton, MD    Family History Family History  Problem Relation Age of Onset  . Diabetes Father   . Hypertension Father   . Heart disease Father     Social History Social History   Tobacco Use  . Smoking status: Current Every Day Smoker    Packs/day: 1.00    Years: 25.00    Pack years: 25.00    Types: Cigarettes    Last attempt to quit: 08/01/2011    Years since quitting: 7.8  . Smokeless tobacco: Never Used  . Tobacco comment: he smokes 6 cigarettes a week  Substance Use Topics  . Alcohol use: Yes    Comment: occasionally/ one half  pint  . Drug use: Yes    Types: Cocaine    Comment: he used yesterday     Allergies   Patient has no known allergies.   Review of Systems Review of Systems  Unable to perform ROS: Mental status change     Physical Exam Updated Vital Signs Ht 5\' 8"  (1.727 m)   Wt 74 kg   BMI 24.81 kg/m   Physical Exam Vitals signs and nursing note reviewed.  Constitutional:      Appearance: He is well-developed.  HENT:     Head: Normocephalic and atraumatic.     Mouth/Throat:     Mouth: Mucous membranes are moist.  Eyes:     General:        Right eye: No discharge.        Left eye: No discharge.     Conjunctiva/sclera: Conjunctivae normal.  Neck:     Musculoskeletal: Normal range of motion and neck supple.  Cardiovascular:     Rate and Rhythm: Normal rate and regular rhythm.     Heart sounds: Normal heart sounds.  Pulmonary:     Effort: Pulmonary effort is normal. No respiratory distress.     Breath sounds: Normal  breath sounds.  Abdominal:     Palpations: Abdomen is soft.     Tenderness: There is no abdominal tenderness.     Comments: Abdomen is soft.  Patient moans slightly with palpation of the left lateral abdomen.  Musculoskeletal:     Comments: Clamp in place to left upper extremity fistula.  Palpable thrill distally.  No active bleeding.  Skin:    General: Skin is warm and dry.  Neurological:     General: No focal deficit present.     Mental Status: He is alert. He is disoriented.     Comments: Patient follows basic commands such as stick out her tongue.  He states that he is not in any pain.      ED Treatments / Results  Labs (all labs ordered are listed, but only abnormal results are displayed) Labs Reviewed  COMPREHENSIVE METABOLIC PANEL - Abnormal; Notable for the following components:      Result Value   Chloride 97 (*)    BUN 40 (*)    Creatinine, Ser 10.76 (*)    Calcium 8.4 (*)    Albumin 3.4 (*)    AST 14 (*)    GFR calc non Af Amer 4 (*)    GFR calc Af Amer 5 (*)    All other components within normal limits  CBC WITH DIFFERENTIAL/PLATELET - Abnormal; Notable for the following components:   WBC 2.9 (*)    RBC 3.34 (*)    Hemoglobin 10.8 (*)    HCT 34.7 (*)    MCV 103.9 (*)    Platelets 99 (*)    Lymphs Abs 0.5 (*)    All other components within normal limits  CBG MONITORING, ED - Abnormal; Notable for the following components:   Glucose-Capillary 60 (*)    All other components within normal limits  POCT I-STAT EG7 - Abnormal; Notable for the following components:   pO2, Ven 26.0 (*)    Bicarbonate 28.8 (*)    Acid-Base Excess 4.0 (*)    Calcium, Ion 0.99 (*)    HCT 34.0 (*)    Hemoglobin 11.6 (*)    All other components within normal limits  CBG MONITORING, ED - Abnormal; Notable for the following components:   Glucose-Capillary 104 (*)  All other components within normal limits  TROPONIN I (HIGH SENSITIVITY) - Abnormal; Notable for the following  components:   Troponin I (High Sensitivity) 22 (*)    All other components within normal limits  TROPONIN I (HIGH SENSITIVITY) - Abnormal; Notable for the following components:   Troponin I (High Sensitivity) 21 (*)    All other components within normal limits  ETHANOL  LACTIC ACID, PLASMA  AMMONIA  MAGNESIUM  I-STAT VENOUS BLOOD GAS, ED  CBG MONITORING, ED  CBG MONITORING, ED    EKG EKG Interpretation  Date/Time:  Tuesday May 28 2019 18:18:25 EST Ventricular Rate:  80 PR Interval:    QRS Duration: 89 QT Interval:  481 QTC Calculation: 555 R Axis:   61 Text Interpretation: Sinus rhythm Ventricular premature complex Probable left atrial enlargement Left ventricular hypertrophy Nonspecific T abnrm, anterolateral leads Prolonged QT interval prolonged QT new since previous Confirmed by Wandra Arthurs [phone]) on 05/28/2019 6:59:08 PM   Radiology Ct Head Wo Contrast  Result Date: 05/28/2019 CLINICAL DATA:  Altered mental status EXAM: CT HEAD WITHOUT CONTRAST TECHNIQUE: Contiguous axial images were obtained from the base of the skull through the vertex without intravenous contrast. COMPARISON:  03/31/2018 FINDINGS: Brain: No evidence of acute infarction, hemorrhage, hydrocephalus, extra-axial collection or mass lesion/mass effect. Scattered low-density changes within the periventricular and subcortical white matter compatible with chronic microvascular ischemic change. Mild diffuse cerebral volume loss. Vascular: Mild atherosclerotic calcifications involving the large vessels of the skull base. No unexpected hyperdense vessel. Skull: No acute fracture. Sinuses/Orbits: No acute findings. Other: None. IMPRESSION: No acute intracranial findings. Electronically Signed   By: Davina Poke M.D.   On: 05/28/2019 18:17   Dg Chest Port 1 View  Result Date: 05/28/2019 CLINICAL DATA:  Altered LOC EXAM: PORTABLE CHEST 1 VIEW COMPARISON:  12/24/2018 FINDINGS: Cardiomegaly, increased compared  to prior. No pleural effusion, focal airspace disease or pneumothorax. Aortic atherosclerosis. IMPRESSION: No active disease.  Cardiomegaly, increased compared to prior. Electronically Signed   By: Donavan Foil M.D.   On: 05/28/2019 18:03    Procedures Procedures (including critical care time)  Medications Ordered in ED Medications  dextrose 50 % solution (50 mLs  Given 05/28/19 1820)  acetaminophen (TYLENOL) tablet 650 mg (650 mg Oral Given 05/28/19 1933)     Initial Impression / Assessment and Plan / ED Course  I have reviewed the triage vital signs and the nursing notes.  Pertinent labs & imaging results that were available during my care of the patient were reviewed by me and considered in my medical decision making (see chart for details).        Patient seen and examined. Work-up initiated.   Vital signs reviewed and are as follows: BP (!) 208/113 (BP Location: Right Arm)   Pulse 85   Temp 98.3 F (36.8 C) (Oral)   Resp 16   Ht 5\' 8"  (1.727 m)   Wt 74 kg   SpO2 95%   BMI 24.81 kg/m   7:29 PM Pt had low CBG, given D50, improved to low 100's. He is more awake now. States cramping in right lower leg. Tylenol ordered.   Discussed with Dr. Darl Householder who will see.   11:44 PM patient has been maintaining normal blood sugar.  Troponin 22>21.   On reexam, patient is sleeping.  Will ambulate.  Anticipate discharge to home.  BP (!) 143/81   Pulse 76   Temp 98.2 F (36.8 C) (Oral)   Resp 19  Ht 5\' 8"  (1.727 m)   Wt 74 kg   SpO2 100%   BMI 24.81 kg/m    Encourage patient to follow-up at dialysis in 2 days.  Return to emergency department with worsening or changing symptoms.    Final Clinical Impressions(s) / ED Diagnoses   Final diagnoses:  Hypoglycemia  Encephalopathy  ESRD on hemodialysis [facility])    Patient with history of end-stage renal disease with altered mental status and decreased level of consciousness at the end of his session today.  Hypertensive on  arrival, improved during ED stay without treatment.  Work-up largely reassuring.  Blood sugar was low.  This was treated with D50 and food.  Patient stable during ED stay.  Delta troponin stable.  Plan for discharge.   ED Discharge Orders    None       Carlisle Cater, Hershal Coria 05/28/19 2348    Drenda Freeze, MD 05/31/19 [phone]

## 2019-06-01 DIAGNOSIS — N2581 Secondary hyperparathyroidism of renal origin: Secondary | ICD-10-CM | POA: Diagnosis not present

## 2019-06-01 DIAGNOSIS — Z992 Dependence on renal dialysis: Secondary | ICD-10-CM | POA: Diagnosis not present

## 2019-06-01 DIAGNOSIS — D509 Iron deficiency anemia, unspecified: Secondary | ICD-10-CM | POA: Diagnosis not present

## 2019-06-01 DIAGNOSIS — N186 End stage renal disease: Secondary | ICD-10-CM | POA: Diagnosis not present

## 2019-06-03 DIAGNOSIS — N186 End stage renal disease: Secondary | ICD-10-CM | POA: Diagnosis not present

## 2019-06-03 DIAGNOSIS — D509 Iron deficiency anemia, unspecified: Secondary | ICD-10-CM | POA: Diagnosis not present

## 2019-06-03 DIAGNOSIS — Z992 Dependence on renal dialysis: Secondary | ICD-10-CM | POA: Diagnosis not present

## 2019-06-03 DIAGNOSIS — N2581 Secondary hyperparathyroidism of renal origin: Secondary | ICD-10-CM | POA: Diagnosis not present

## 2019-06-05 DIAGNOSIS — N186 End stage renal disease: Secondary | ICD-10-CM | POA: Diagnosis not present

## 2019-06-05 DIAGNOSIS — D509 Iron deficiency anemia, unspecified: Secondary | ICD-10-CM | POA: Diagnosis not present

## 2019-06-05 DIAGNOSIS — N2581 Secondary hyperparathyroidism of renal origin: Secondary | ICD-10-CM | POA: Diagnosis not present

## 2019-06-05 DIAGNOSIS — Z992 Dependence on renal dialysis: Secondary | ICD-10-CM | POA: Diagnosis not present

## 2019-06-08 DIAGNOSIS — Z992 Dependence on renal dialysis: Secondary | ICD-10-CM | POA: Diagnosis not present

## 2019-06-08 DIAGNOSIS — N2581 Secondary hyperparathyroidism of renal origin: Secondary | ICD-10-CM | POA: Diagnosis not present

## 2019-06-08 DIAGNOSIS — D509 Iron deficiency anemia, unspecified: Secondary | ICD-10-CM | POA: Diagnosis not present

## 2019-06-08 DIAGNOSIS — N186 End stage renal disease: Secondary | ICD-10-CM | POA: Diagnosis not present

## 2019-08-14 ENCOUNTER — Ambulatory Visit: Payer: Medicare Other | Admitting: Nurse Practitioner

## 2019-08-16 ENCOUNTER — Encounter: Payer: Self-pay | Admitting: Nurse Practitioner

## 2019-08-16 ENCOUNTER — Ambulatory Visit (INDEPENDENT_AMBULATORY_CARE_PROVIDER_SITE_OTHER): Payer: Medicare Other | Admitting: Nurse Practitioner

## 2019-08-16 ENCOUNTER — Other Ambulatory Visit: Payer: Self-pay

## 2019-08-16 VITALS — BP 136/86 | HR 58 | Temp 96.8°F | Ht 68.0 in | Wt 155.0 lb

## 2019-08-16 DIAGNOSIS — D649 Anemia, unspecified: Secondary | ICD-10-CM | POA: Diagnosis not present

## 2019-08-16 DIAGNOSIS — R195 Other fecal abnormalities: Secondary | ICD-10-CM | POA: Diagnosis not present

## 2019-08-16 NOTE — Progress Notes (Signed)
Agree with assessment as outlined. Can proceed with capsule to clear his small bowel, assess for AVM burden, but this could be a multifactorial anemia in the setting of ESRD. Nevin Bloodgood can you clarify date of his last Hgb, this needs to be trended closely as he is close to needing a blood transfusion.

## 2019-08-16 NOTE — Progress Notes (Signed)
IMPRESSION and PLAN:    64 year old male with pmh significant for but not limited to ESRD on HD, CAD, COPD, PAF not anticoagulated, intestinal AVMs, colon polyps, treated HCV.   1. Recurrent anemia (hgb 7.4, down from 11.5 in November) and  Hemoccult positive stools.  Nonbleeding duodenal AVMs (treated) and colon polyps on EGD/colonoscopy  in November 2020 (done from anemia and dark, heme+ stools).  --For unexplained recurrent anemia will proceed with video capsule endoscopy  2.  History of HCV, treated.  No evidence for cirrhosis on imaging.     HPI:    Primary GI: [place] Cellar, MD  Chief complaint : None.  Blood found in my stool   Raymond [name] is a 64 y.o. male with a pmh significant for but not limited to ESRD on HD, CAD, COPD, PAF not anticoagulated, intestinal AVMs, colon polyps, treated HCV.   We saw patient in the hospital February 2020 for evaluation of worsening of chronic anemia and heme positive stools.  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  Complete colonoscopy with adequate prep remarkable for diverticulosis, hemorrhoids and several small polyps which were removed and proved to be adenomatous  Raymond [name] has not had any overt GI bleeding.  No bowel changes, no abdominal pain, nausea or vomiting.  No NSAID use.  He is not anticoagulated.  He says dialysis gave him stool cards because of the anemia.  Dizziness or chest pain.  He has mild chronic shortness of breath with exertion  Data Reviewed:  Hemoglobin 7.4.    Baseline difficult to determine.  Hemoglobin through the months has ranged anywhere from 8-11 though in November 2020 it was 54.6 so this is certainly a decline Platelets 214 Ferritin 545 TIBC 306, transferrin saturation 86%  Review of systems:     No chest pain, no SOB, no fevers, no urinary sx   Past Medical History:  Diagnosis Date  . Anemia   . CAD (coronary artery disease)     a. 03/2018: cath showing 20 to 30% stenosis along the LAD, luminal irregularities along the RCA, and angiographically normal LCx  . COPD (chronic obstructive pulmonary disease) ([place])   . Dyspnea    "with too much fluid"  . ESRD (end stage renal disease) on dialysis [facility])    "MWF; Jeneen Rinks" (07/22/17)  . Hemodialysis patient ([place])   . Hepatitis C    Still positive s/p liver biopsy at [facility]  and interferon therapy for 6 months. Most recent lab work was on 10/24/12  . Hepatitis C    "took the tx; gone now" (12/05/2016)  Was treated  . History of blood transfusion ~ 2012/2013   "related to my kidneys; blood was low"  . Hypertension   . Substance abuse ([place])   . Thyroid disease     Patient's surgical history, family medical history, social history, medications and allergies were all reviewed in Epic   Creatinine clearance cannot be calculated (Patient's most recent lab result is older than the maximum 21 days allowed.)  Current Outpatient Medications  Medication Sig Dispense Refill  . amiodarone (PACERONE) 200 MG tablet Take 1 tablet (200 mg total) by mouth daily. 30 tablet 0  . bismuth subsalicylate (PEPTO BISMOL) 262 MG chewable tablet Chew 524 mg by mouth as needed for indigestion or diarrhea or loose stools.    . sevelamer carbonate (RENVELA) 800 MG tablet Take 4 tablets (  3,200 mg total) by mouth 3 (three) times daily with meals. (Patient taking differently: Take 4,800 mg by mouth 3 (three) times daily with meals. Six tablets with each meal) 120 tablet 0   No current facility-administered medications for this visit.   Facility-Administered Medications Ordered in Other Visits  Medication Dose Route Frequency Provider Last Rate Last Admin  . midazolam (VERSED) 5 MG/5ML injection    Anesthesia Intra-op Sammie Bench, CRNA   2 mg at 04/11/18 1042    Physical Exam:     BP 136/86 (BP Location: Right Arm, Patient Position: Sitting, Cuff Size: Normal)   Pulse (!) 58   Temp  (!) 96.8 F (36 C)   Ht 5\' 8"  (1.727 m)   Wt 155 lb (70.3 kg)   SpO2 100%   BMI 23.57 kg/m   GENERAL:  Pleasant male in NAD PSYCH: : Cooperative, normal affect CARDIAC:  RRR,  no peripheral edema PULM: Normal respiratory effort, lungs CTA bilaterally, no wheezing ABDOMEN:  Nondistended, soft, nontender. No obvious masses, no hepatomegaly,  normal bowel sounds Musculoskeletal:  Normal muscle tone, normal strength NEURO: Alert and oriented x 3, no focal neurologic deficits   Raymond [name] , NP 08/16/2019, 12:02 PM  Cc:   Raymond Area MD

## 2019-08-16 NOTE — Progress Notes (Signed)
Yes, it was just drawn on 1/31 by Nephrology who has transfused patient in the past.

## 2019-08-16 NOTE — Patient Instructions (Signed)
If you are age 66 or older, your body mass index should be between 23-30. Your Body mass index is 23.57 kg/m. If this is out of the aforementioned range listed, please consider follow up with your Primary Care Provider.  If you are age 47 or younger, your body mass index should be between 19-25. Your Body mass index is 23.57 kg/m. If this is out of the aformentioned range listed, please consider follow up with your Primary Care Provider.     [facility]SOC[facility] CAPSULE ENDOSCOPY PATIENT INSTRUCTION SHEET  Raymond [name] [birth year] [id number]   1. Seven (7) days prior to capsule endoscopy stop taking iron supplements and carafate.  2. 08/21/19 Two (2) days prior to capsule endoscopy stop taking aspirin or any arthritis drugs.  3. 08/22/19 Day before capsule endoscopy purchase a 238 gram bottle of Miralax from the laxative section of your drug store, and a 32 oz. bottle of Gatorade (no red).    4. 08/22/19 One (1) day prior to capsule endoscopy: a) Stop smoking. b) Eat a regular diet until 12:00 Noon. c) After 12:00 Noon take only the following: Black coffee  Jell-O (no fruit or red Jell-o) Water   Bouillon (chicken or beef) 7-Up   Cranberry Juice Tea   Kool-Aid Popsicle (not red) Sprite   Coke Ginger Ale  Pepsi Mountain Dew Gatorade d) At 6:00 pm the evening before your appointment, drink 7 capfuls (105 grams) of Miralax with 32 oz. Gatorade. Drink 8 oz every 15 minutes until gone. e) Nothing to eat or drink after midnight except medications with a sip of water.  5. 08/23/19 Day of capsule endoscopy:            Do not have anything to eat or drink after midnight No medications for 2 hours prior to your test.  6. Please arrive at [facility]  3rd floor patient registration area by 8:15 amam on: 08/23/19.   For any questions: Call [place] at [phone] and ask to speak with one of the capsule endoscopy nurses.      The above instructions have been reviewed and explained to  me by________________   Patient signature:_________________________________________     Date:________________        What you should know.   You have been scheduled for a Capsule Endoscopy with Capsocam.  You will swallow a vitamin size pill that contains cameras that will take pictures of your small intestine (bowel).  Your small bowel connects to your stomach on one end and the large intestine or bowel on the other. The capsule moves through the gastrointestinal tract taking pictures along the way. The images are stored on the capsule.  1-5 days after the capsule ingestion you will retrieve the capsule and mail it in a prepaid envelope to [employer] to download the images.  Your physician will be provided a copy of the images to review.   Who should have a capsule endoscopy?  You may need a small bowel capsule endoscopy if you have symptoms, such as blood in your stool, chronic pain, diarrhea, or unexplained anemia. The small intestine is a hollow organ that cannot be easily visualized with a scope due to its length.  The capsule endoscopy allows your physician to directly visualize the intestine.  The pictures may show intestine growths, inflammation, or bleeding in the small intestine.   What are the risks with a capsule endoscopy?  The pictures may not be clear or give [place] a clear cause of your symptoms  The capsule may become trapped in the esophagus or intestines.  You may need surgery or endoscopic procedures to remove the capsule. You may not have a MRI until the capsule endoscopy has been retrieved.   How do I retrieve the capsule?  You will be provided a kit with step-by-step instructions for capsule retrieval and mailing.  You should expect to retrieve the capsule in 1-5 days after ingestion.  This will depend on your individual bowel habits.    Thank you for choosing me and [facility].   [name], NP

## 2019-08-23 ENCOUNTER — Telehealth: Payer: Self-pay

## 2019-08-23 ENCOUNTER — Other Ambulatory Visit: Payer: Self-pay

## 2019-08-23 NOTE — Telephone Encounter (Signed)
Patient is here for his scheduled capsule endoscopy.  Unfortunately the patient is not prepped. His niece purchased Mylanta instead of Miralax. Patient re-instructed and given new written instructions. Samples of Miralax given to aid with his prep. Teach back method used.  Patient will return on 08/26/19 for the capsule endoscopy.  FYI for you Bri

## 2019-08-26 ENCOUNTER — Other Ambulatory Visit: Payer: Self-pay

## 2019-08-26 ENCOUNTER — Ambulatory Visit (INDEPENDENT_AMBULATORY_CARE_PROVIDER_SITE_OTHER): Payer: Medicare Other | Admitting: Gastroenterology

## 2019-08-26 DIAGNOSIS — R195 Other fecal abnormalities: Secondary | ICD-10-CM | POA: Diagnosis not present

## 2019-08-26 DIAGNOSIS — D649 Anemia, unspecified: Secondary | ICD-10-CM | POA: Diagnosis not present

## 2019-08-26 NOTE — Progress Notes (Signed)
Patient arrived for capsule endoscopy; Patient tolerated procedure without complications; Patient verbalized understanding of written and verbal instructions; Patient was given a sample of Miralax in order to complete capsule instructions as patient reports he "took all of that bottle of the Miralax for this test today"; CAPSULE= [id number] EXPIRES=2020-08-20

## 2019-08-26 NOTE — Telephone Encounter (Signed)
Patient consumed capsule today while at office; patient verbalized understanding of information as well as post capsule instructions handed to patient;

## 2019-08-26 NOTE — Patient Instructions (Signed)

## 2019-09-16 ENCOUNTER — Telehealth: Payer: Self-pay

## 2019-09-16 NOTE — Telephone Encounter (Signed)
-----   Message from Alfredia Ferguson, PA-C sent at 09/15/2019  3:39 PM EST ----- Regarding: Capsule Capsule on this pt   ingested 2/15- still not sent in  by patient - please call pt and find out what happened- thanks

## 2019-09-16 NOTE — Telephone Encounter (Signed)
Left message for patient to please call back. Trying to track down the capsule endoscopy

## 2019-09-17 ENCOUNTER — Telehealth: Payer: Self-pay

## 2019-09-17 NOTE — Telephone Encounter (Signed)
Left message again to please call back. I tried the patient contact person, and that phone rings and then goes busy.

## 2019-09-18 ENCOUNTER — Telehealth: Payer: Self-pay

## 2019-09-18 NOTE — Telephone Encounter (Signed)
Called and Lm for pt to call back and let us know what happened with the capsule that he was supposed to retrieve and send back last month.

## 2019-09-18 NOTE — Telephone Encounter (Signed)
-----   Message from Alfredia Ferguson, PA-C sent at 09/15/2019  3:39 PM EST ----- Regarding: Capsule Capsule on this pt   ingested 2/15- still not sent in  by patient - please call pt and find out what happened- thanks

## 2019-09-18 NOTE — Progress Notes (Signed)
Dear Scot Dock,  We have been trying to reach you by phone, but have not been able to. We are sending this letter to find out about the Capsule you came to our office and swallowed ( with the camera)  on August 26, 2019. The company that it is sent to so they can develop the pictures, did not receive it. Please call our office at [phone] so we can discuss this with you.  Thank-you, Dr. Doyne Keel office

## 2019-09-18 NOTE — Telephone Encounter (Signed)
-----   Message from Yetta Flock, MD sent at 09/18/2019  2:54 PM EST ----- Regarding: FW: Capsule Jan can you do me a favor and ask this patient if he ever submitted / sent back his capsule endoscopy that he had done? Thanks ----- Message ----- From: Leotis Pain Sent: 09/15/2019   3:39 PM EST To: Yetta Flock, MD, Hughie Closs, RN Subject: Capsule                                        Capsule on this pt   ingested 2/15- still not sent in  by patient - please call pt and find out what happened- thanks

## 2019-09-18 NOTE — Telephone Encounter (Signed)
Sent letter to patient requesting he call us, have not been able to reach him by phone. Trying to find out the status of the capsule endoscopy he did on 08/26/19

## 2019-09-24 NOTE — Telephone Encounter (Signed)
Have tried to call pt twice today but the phone is busy.

## 2019-09-25 NOTE — Progress Notes (Signed)
Error.  Letter already sent by RN, Sherlynn Stalls.

## 2019-09-25 NOTE — Telephone Encounter (Signed)
Letter has been sent to patient by Sherlynn Stalls, RN for Dr. Havery Moros to contact the office regarding capsule that was not returned.

## 2020-01-14 ENCOUNTER — Emergency Department (HOSPITAL_COMMUNITY): Payer: Medicare Other

## 2020-01-14 ENCOUNTER — Encounter (HOSPITAL_COMMUNITY): Payer: Self-pay | Admitting: Emergency Medicine

## 2020-01-14 ENCOUNTER — Other Ambulatory Visit: Payer: Self-pay

## 2020-01-14 ENCOUNTER — Inpatient Hospital Stay (HOSPITAL_COMMUNITY)
Admission: EM | Admit: 2020-01-14 | Discharge: 2020-01-18 | DRG: 377 | Disposition: A | Discharge status: Home / Self Care | Payer: Medicare Other | Attending: Internal Medicine | Admitting: Internal Medicine

## 2020-01-14 DIAGNOSIS — Z91138 Patient's unintentional underdosing of medication regimen for other reason: Secondary | ICD-10-CM

## 2020-01-14 DIAGNOSIS — J810 Acute pulmonary edema: Secondary | ICD-10-CM | POA: Diagnosis present

## 2020-01-14 DIAGNOSIS — R778 Other specified abnormalities of plasma proteins: Secondary | ICD-10-CM | POA: Diagnosis not present

## 2020-01-14 DIAGNOSIS — I739 Peripheral vascular disease, unspecified: Secondary | ICD-10-CM | POA: Diagnosis not present

## 2020-01-14 DIAGNOSIS — F1910 Other psychoactive substance abuse, uncomplicated: Secondary | ICD-10-CM | POA: Diagnosis not present

## 2020-01-14 DIAGNOSIS — I120 Hypertensive chronic kidney disease with stage 5 chronic kidney disease or end stage renal disease: Secondary | ICD-10-CM | POA: Diagnosis present

## 2020-01-14 DIAGNOSIS — N2581 Secondary hyperparathyroidism of renal origin: Secondary | ICD-10-CM | POA: Diagnosis present

## 2020-01-14 DIAGNOSIS — I480 Paroxysmal atrial fibrillation: Secondary | ICD-10-CM | POA: Diagnosis present

## 2020-01-14 DIAGNOSIS — K31811 Angiodysplasia of stomach and duodenum with bleeding: Principal | ICD-10-CM | POA: Diagnosis present

## 2020-01-14 DIAGNOSIS — K5521 Angiodysplasia of colon with hemorrhage: Secondary | ICD-10-CM | POA: Diagnosis present

## 2020-01-14 DIAGNOSIS — F1410 Cocaine abuse, uncomplicated: Secondary | ICD-10-CM | POA: Diagnosis present

## 2020-01-14 DIAGNOSIS — Z8619 Personal history of other infectious and parasitic diseases: Secondary | ICD-10-CM | POA: Diagnosis not present

## 2020-01-14 DIAGNOSIS — J9621 Acute and chronic respiratory failure with hypoxia: Secondary | ICD-10-CM | POA: Diagnosis present

## 2020-01-14 DIAGNOSIS — Z8719 Personal history of other diseases of the digestive system: Secondary | ICD-10-CM

## 2020-01-14 DIAGNOSIS — K921 Melena: Secondary | ICD-10-CM

## 2020-01-14 DIAGNOSIS — J9601 Acute respiratory failure with hypoxia: Secondary | ICD-10-CM | POA: Diagnosis present

## 2020-01-14 DIAGNOSIS — D62 Acute posthemorrhagic anemia: Secondary | ICD-10-CM | POA: Diagnosis present

## 2020-01-14 DIAGNOSIS — F17210 Nicotine dependence, cigarettes, uncomplicated: Secondary | ICD-10-CM | POA: Diagnosis present

## 2020-01-14 DIAGNOSIS — J449 Chronic obstructive pulmonary disease, unspecified: Secondary | ICD-10-CM | POA: Diagnosis not present

## 2020-01-14 DIAGNOSIS — R7989 Other specified abnormal findings of blood chemistry: Secondary | ICD-10-CM | POA: Diagnosis present

## 2020-01-14 DIAGNOSIS — Z79899 Other long term (current) drug therapy: Secondary | ICD-10-CM

## 2020-01-14 DIAGNOSIS — T462X6A Underdosing of other antidysrhythmic drugs, initial encounter: Secondary | ICD-10-CM | POA: Diagnosis present

## 2020-01-14 DIAGNOSIS — I70202 Unspecified atherosclerosis of native arteries of extremities, left leg: Secondary | ICD-10-CM | POA: Diagnosis present

## 2020-01-14 DIAGNOSIS — Z20822 Contact with and (suspected) exposure to covid-19: Secondary | ICD-10-CM | POA: Diagnosis present

## 2020-01-14 DIAGNOSIS — I160 Hypertensive urgency: Secondary | ICD-10-CM | POA: Diagnosis present

## 2020-01-14 DIAGNOSIS — N186 End stage renal disease: Secondary | ICD-10-CM

## 2020-01-14 DIAGNOSIS — E8779 Other fluid overload: Secondary | ICD-10-CM | POA: Diagnosis present

## 2020-01-14 DIAGNOSIS — J441 Chronic obstructive pulmonary disease with (acute) exacerbation: Secondary | ICD-10-CM | POA: Diagnosis present

## 2020-01-14 DIAGNOSIS — Z8774 Personal history of (corrected) congenital malformations of heart and circulatory system: Secondary | ICD-10-CM

## 2020-01-14 DIAGNOSIS — Z95820 Peripheral vascular angioplasty status with implants and grafts: Secondary | ICD-10-CM

## 2020-01-14 DIAGNOSIS — K922 Gastrointestinal hemorrhage, unspecified: Secondary | ICD-10-CM | POA: Diagnosis not present

## 2020-01-14 DIAGNOSIS — Z992 Dependence on renal dialysis: Secondary | ICD-10-CM | POA: Diagnosis not present

## 2020-01-14 DIAGNOSIS — E8889 Other specified metabolic disorders: Secondary | ICD-10-CM | POA: Diagnosis present

## 2020-01-14 DIAGNOSIS — I2510 Atherosclerotic heart disease of native coronary artery without angina pectoris: Secondary | ICD-10-CM | POA: Diagnosis present

## 2020-01-14 DIAGNOSIS — D61818 Other pancytopenia: Secondary | ICD-10-CM | POA: Diagnosis present

## 2020-01-14 DIAGNOSIS — K31819 Angiodysplasia of stomach and duodenum without bleeding: Secondary | ICD-10-CM

## 2020-01-14 DIAGNOSIS — Z8249 Family history of ischemic heart disease and other diseases of the circulatory system: Secondary | ICD-10-CM

## 2020-01-14 DIAGNOSIS — E875 Hyperkalemia: Secondary | ICD-10-CM | POA: Diagnosis present

## 2020-01-14 DIAGNOSIS — Z833 Family history of diabetes mellitus: Secondary | ICD-10-CM

## 2020-01-14 DIAGNOSIS — I70211 Atherosclerosis of native arteries of extremities with intermittent claudication, right leg: Secondary | ICD-10-CM | POA: Diagnosis present

## 2020-01-14 DIAGNOSIS — Z9115 Patient's noncompliance with renal dialysis: Secondary | ICD-10-CM | POA: Diagnosis not present

## 2020-01-14 LAB — CBC
HCT: 18.9 % — ABNORMAL LOW (ref 39.0–52.0)
Hemoglobin: 5.6 g/dL — CL (ref 13.0–17.0)
MCH: 32.6 pg (ref 26.0–34.0)
MCHC: 29.6 g/dL — ABNORMAL LOW (ref 30.0–36.0)
MCV: 109.9 fL — ABNORMAL HIGH (ref 80.0–100.0)
Platelets: 120 10*3/uL — ABNORMAL LOW (ref 150–400)
RBC: 1.72 MIL/uL — ABNORMAL LOW (ref 4.22–5.81)
RDW: 16.9 % — ABNORMAL HIGH (ref 11.5–15.5)
WBC: 3.1 10*3/uL — ABNORMAL LOW (ref 4.0–10.5)
nRBC: 0 % (ref 0.0–0.2)

## 2020-01-14 LAB — CBC WITH DIFFERENTIAL/PLATELET
Abs Immature Granulocytes: 0.02 10*3/uL (ref 0.00–0.07)
Basophils Absolute: 0 10*3/uL (ref 0.0–0.1)
Basophils Relative: 0 %
Eosinophils Absolute: 0.1 10*3/uL (ref 0.0–0.5)
Eosinophils Relative: 1 %
HCT: 18.4 % — ABNORMAL LOW (ref 39.0–52.0)
Hemoglobin: 5.4 g/dL — CL (ref 13.0–17.0)
Immature Granulocytes: 0 %
Lymphocytes Relative: 34 %
Lymphs Abs: 2 10*3/uL (ref 0.7–4.0)
MCH: 31.8 pg (ref 26.0–34.0)
MCHC: 29.3 g/dL — ABNORMAL LOW (ref 30.0–36.0)
MCV: 108.2 fL — ABNORMAL HIGH (ref 80.0–100.0)
Monocytes Absolute: 0.9 10*3/uL (ref 0.1–1.0)
Monocytes Relative: 15 %
Neutro Abs: 3 10*3/uL (ref 1.7–7.7)
Neutrophils Relative %: 50 %
Platelets: 140 10*3/uL — ABNORMAL LOW (ref 150–400)
RBC: 1.7 MIL/uL — ABNORMAL LOW (ref 4.22–5.81)
RDW: 17.3 % — ABNORMAL HIGH (ref 11.5–15.5)
WBC: 5.9 10*3/uL (ref 4.0–10.5)
nRBC: 0 % (ref 0.0–0.2)

## 2020-01-14 LAB — RENAL FUNCTION PANEL
Albumin: 3.3 g/dL — ABNORMAL LOW (ref 3.5–5.0)
Anion gap: 21 — ABNORMAL HIGH (ref 5–15)
BUN: 125 mg/dL — ABNORMAL HIGH (ref 8–23)
CO2: 23 mmol/L (ref 22–32)
Calcium: 9.6 mg/dL (ref 8.9–10.3)
Chloride: 96 mmol/L — ABNORMAL LOW (ref 98–111)
Creatinine, Ser: 14.62 mg/dL — ABNORMAL HIGH (ref 0.61–1.24)
GFR calc Af Amer: 4 mL/min — ABNORMAL LOW (ref >60)
GFR calc non Af Amer: 3 mL/min — ABNORMAL LOW (ref >60)
Glucose, Bld: 110 mg/dL — ABNORMAL HIGH (ref 70–99)
Phosphorus: 5.6 mg/dL — ABNORMAL HIGH (ref 2.5–4.6)
Potassium: 5.5 mmol/L — ABNORMAL HIGH (ref 3.5–5.1)
Sodium: 140 mmol/L (ref 135–145)

## 2020-01-14 LAB — BASIC METABOLIC PANEL
Anion gap: 19 — ABNORMAL HIGH (ref 5–15)
BUN: 125 mg/dL — ABNORMAL HIGH (ref 8–23)
CO2: 25 mmol/L (ref 22–32)
Calcium: 9.6 mg/dL (ref 8.9–10.3)
Chloride: 96 mmol/L — ABNORMAL LOW (ref 98–111)
Creatinine, Ser: 14.51 mg/dL — ABNORMAL HIGH (ref 0.61–1.24)
GFR calc Af Amer: 4 mL/min — ABNORMAL LOW (ref >60)
GFR calc non Af Amer: 3 mL/min — ABNORMAL LOW (ref >60)
Glucose, Bld: 147 mg/dL — ABNORMAL HIGH (ref 70–99)
Potassium: 5.3 mmol/L — ABNORMAL HIGH (ref 3.5–5.1)
Sodium: 140 mmol/L (ref 135–145)

## 2020-01-14 LAB — BRAIN NATRIURETIC PEPTIDE: B Natriuretic Peptide: 2443.9 pg/mL — ABNORMAL HIGH (ref 0.0–100.0)

## 2020-01-14 LAB — HIV ANTIBODY (ROUTINE TESTING W REFLEX): HIV Screen 4th Generation wRfx: NONREACTIVE

## 2020-01-14 LAB — I-STAT CHEM 8, ED
BUN: 127 mg/dL — ABNORMAL HIGH (ref 8–23)
Calcium, Ion: 1.15 mmol/L (ref 1.15–1.40)
Chloride: 98 mmol/L (ref 98–111)
Creatinine, Ser: 15.1 mg/dL — ABNORMAL HIGH (ref 0.61–1.24)
Glucose, Bld: 145 mg/dL — ABNORMAL HIGH (ref 70–99)
HCT: 19 % — ABNORMAL LOW (ref 39.0–52.0)
Hemoglobin: 6.5 g/dL — CL (ref 13.0–17.0)
Potassium: 5.1 mmol/L (ref 3.5–5.1)
Sodium: 139 mmol/L (ref 135–145)
TCO2: 29 mmol/L (ref 22–32)

## 2020-01-14 LAB — HEMOGLOBIN AND HEMATOCRIT, BLOOD
HCT: 22.4 % — ABNORMAL LOW (ref 39.0–52.0)
Hemoglobin: 7 g/dL — ABNORMAL LOW (ref 13.0–17.0)

## 2020-01-14 LAB — HEPATITIS B SURFACE ANTIGEN: Hepatitis B Surface Ag: NONREACTIVE

## 2020-01-14 LAB — TROPONIN I (HIGH SENSITIVITY)
Troponin I (High Sensitivity): 32 ng/L — ABNORMAL HIGH (ref <18)
Troponin I (High Sensitivity): 34 ng/L — ABNORMAL HIGH (ref <18)

## 2020-01-14 LAB — PREPARE RBC (CROSSMATCH)

## 2020-01-14 LAB — ETHANOL: Alcohol, Ethyl (B): <10 mg/dL (ref <10)

## 2020-01-14 LAB — MRSA PCR SCREENING: MRSA by PCR: NEGATIVE

## 2020-01-14 LAB — SARS CORONAVIRUS 2 BY RT PCR (HOSPITAL ORDER, PERFORMED IN ~~LOC~~ HOSPITAL LAB): SARS Coronavirus 2: NEGATIVE

## 2020-01-14 LAB — POC OCCULT BLOOD, ED: Fecal Occult Bld: POSITIVE — AB

## 2020-01-14 IMAGING — DX DG CHEST 1V PORT
1 series · 1 of 1 positions shown · non-contrast
Comparison: [DATE].

CLINICAL DATA: Shortness of breath.  History of COPD and dialysis.

EXAM:
PORTABLE CHEST 1 VIEW

[chest ap]
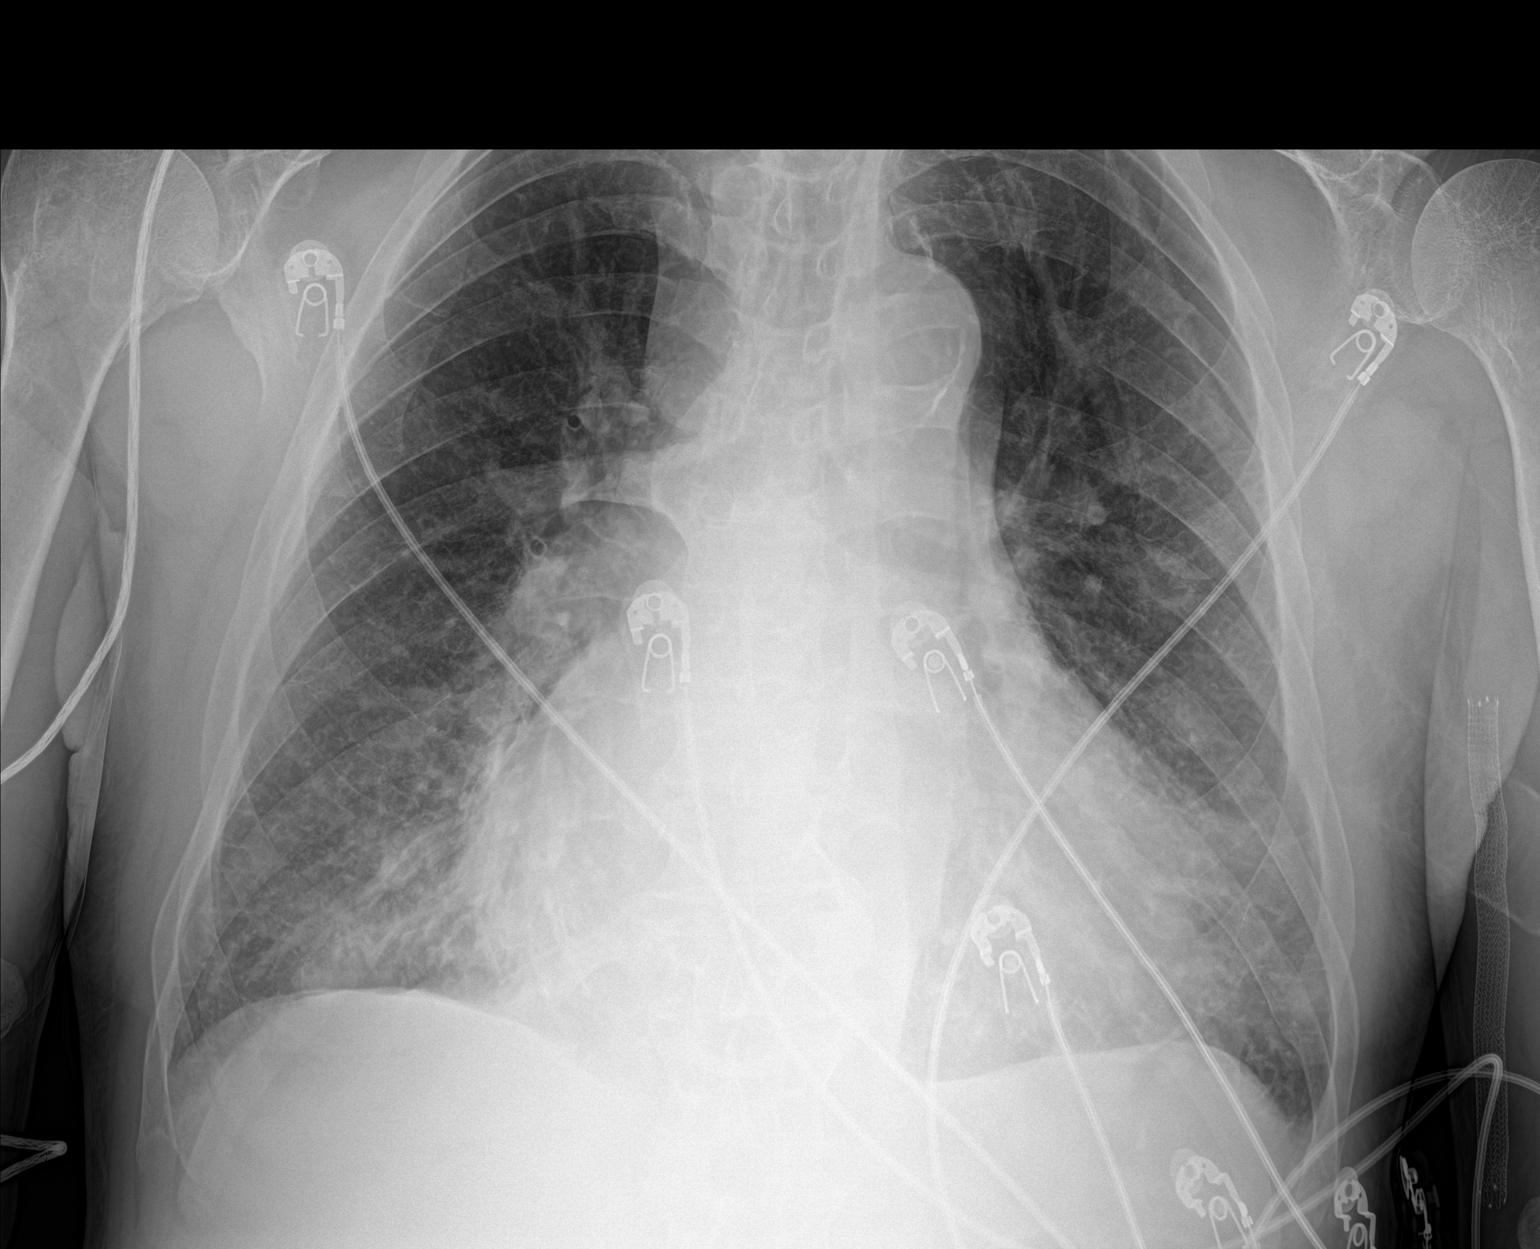

[1 of 1 positions shown; findings below may reference images not displayed]

FINDINGS: Cardiomegaly with pulmonary venous congestion and bilateral
interstitial prominence. Tiny bilateral pleural effusions. These
findings are most consistent with CHF. Pneumonitis cannot be
excluded. No pneumothorax. No acute bony abnormality.
IMPRESSION: Cardiomegaly with pulmonary venous congestion and bilateral
interstitial prominence. Tiny bilateral pleural effusions. Findings
most consistent with CHF.

## 2020-01-14 MED ORDER — SODIUM CHLORIDE 0.9 % IV SOLN
80.0000 mg | Freq: Once | INTRAVENOUS | Status: AC
  Administered 2020-01-14: 80 mg via INTRAVENOUS
  Filled 2020-01-14: qty 80

## 2020-01-14 MED ORDER — HEPARIN SODIUM (PORCINE) 1000 UNIT/ML DIALYSIS: 1000.0000 [IU] | INTRAMUSCULAR | Status: DC | PRN

## 2020-01-14 MED ORDER — AMLODIPINE BESYLATE 5 MG PO TABS
12.5000 mg | ORAL_TABLET | Freq: Every day | ORAL | Status: DC
  Administered 2020-01-14 – 2020-01-16 (×3): 12.5 mg via ORAL
  Filled 2020-01-14 (×2): qty 1
  Filled 2020-01-14: qty 3

## 2020-01-14 MED ORDER — NITROGLYCERIN 2 % TD OINT
1.0000 [in_us] | TOPICAL_OINTMENT | Freq: Once | TRANSDERMAL | Status: AC
  Administered 2020-01-14: 1 [in_us] via TOPICAL
  Filled 2020-01-14: qty 1

## 2020-01-14 MED ORDER — PENTAFLUOROPROP-TETRAFLUOROETH EX AERO: 1.0000 "application " | INHALATION_SPRAY | CUTANEOUS | Status: DC | PRN

## 2020-01-14 MED ORDER — ONDANSETRON HCL 4 MG/2ML IJ SOLN: 4.0000 mg | Freq: Four times a day (QID) | INTRAMUSCULAR | Status: DC | PRN

## 2020-01-14 MED ORDER — PANTOPRAZOLE SODIUM 40 MG IV SOLR
40.0000 mg | Freq: Two times a day (BID) | INTRAVENOUS | Status: DC
  Administered 2020-01-17 – 2020-01-18 (×2): 40 mg via INTRAVENOUS
  Filled 2020-01-14 (×2): qty 40

## 2020-01-14 MED ORDER — BISMUTH SUBSALICYLATE 262 MG PO CHEW
524.0000 mg | CHEWABLE_TABLET | ORAL | Status: DC | PRN
  Administered 2020-01-15: 524 mg via ORAL
  Filled 2020-01-14 (×3): qty 2

## 2020-01-14 MED ORDER — METOPROLOL SUCCINATE ER 50 MG PO TB24
50.0000 mg | ORAL_TABLET | Freq: Two times a day (BID) | ORAL | Status: DC
  Administered 2020-01-14 – 2020-01-17 (×6): 50 mg via ORAL
  Filled 2020-01-14 (×5): qty 1
  Filled 2020-01-14: qty 2
  Filled 2020-01-14: qty 1

## 2020-01-14 MED ORDER — SODIUM CHLORIDE 0.9 % IV SOLN
8.0000 mg/h | INTRAVENOUS | Status: AC
  Administered 2020-01-14 – 2020-01-17 (×5): 8 mg/h via INTRAVENOUS
  Filled 2020-01-14 (×10): qty 80

## 2020-01-14 MED ORDER — METOPROLOL SUCCINATE ER 25 MG PO TB24: 50.0000 mg | ORAL_TABLET | Freq: Two times a day (BID) | ORAL | Status: DC

## 2020-01-14 MED ORDER — ALTEPLASE 2 MG IJ SOLR
2.0000 mg | Freq: Once | INTRAMUSCULAR | Status: DC | PRN
  Filled 2020-01-14: qty 2

## 2020-01-14 MED ORDER — LIDOCAINE HCL (PF) 1 % IJ SOLN: 5.0000 mL | INTRAMUSCULAR | Status: DC | PRN

## 2020-01-14 MED ORDER — SODIUM CHLORIDE 0.9 % IV SOLN
100.0000 mL | INTRAVENOUS | Status: DC | PRN
Start: 2020-01-14 — End: 2020-01-14

## 2020-01-14 MED ORDER — ALTEPLASE 2 MG IJ SOLR
2.0000 mg | Freq: Once | INTRAMUSCULAR | Status: DC | PRN
Start: 2020-01-14 — End: 2020-01-14

## 2020-01-14 MED ORDER — ONDANSETRON HCL 4 MG PO TABS: 4.0000 mg | ORAL_TABLET | Freq: Four times a day (QID) | ORAL | Status: DC | PRN

## 2020-01-14 MED ORDER — SODIUM CHLORIDE 0.9 % IV SOLN: 100.0000 mL | INTRAVENOUS | Status: DC | PRN

## 2020-01-14 MED ORDER — LIDOCAINE HCL (PF) 1 % IJ SOLN
5.0000 mL | INTRAMUSCULAR | Status: DC | PRN
Start: 2020-01-14 — End: 2020-01-14

## 2020-01-14 MED ORDER — ACETAMINOPHEN 325 MG PO TABS: 650.0000 mg | ORAL_TABLET | Freq: Four times a day (QID) | ORAL | Status: DC | PRN

## 2020-01-14 MED ORDER — IPRATROPIUM-ALBUTEROL 0.5-2.5 (3) MG/3ML IN SOLN
3.0000 mL | Freq: Four times a day (QID) | RESPIRATORY_TRACT | Status: DC
  Administered 2020-01-14 – 2020-01-15 (×3): 3 mL via RESPIRATORY_TRACT
  Filled 2020-01-14 (×2): qty 3

## 2020-01-14 MED ORDER — CHLORHEXIDINE GLUCONATE CLOTH 2 % EX PADS: 6.0000 | MEDICATED_PAD | Freq: Every day | CUTANEOUS | Status: DC

## 2020-01-14 MED ORDER — HEPARIN SODIUM (PORCINE) 1000 UNIT/ML DIALYSIS
1000.0000 [IU] | INTRAMUSCULAR | Status: DC | PRN
Start: 2020-01-14 — End: 2020-01-14

## 2020-01-14 MED ORDER — NICOTINE 21 MG/24HR TD PT24
21.0000 mg | MEDICATED_PATCH | Freq: Every day | TRANSDERMAL | Status: DC
  Administered 2020-01-14 – 2020-01-18 (×5): 21 mg via TRANSDERMAL
  Filled 2020-01-14 (×5): qty 1

## 2020-01-14 MED ORDER — CHLORHEXIDINE GLUCONATE CLOTH 2 % EX PADS
6.0000 | MEDICATED_PAD | Freq: Every day | CUTANEOUS | Status: DC
  Administered 2020-01-14 – 2020-01-18 (×5): 6 via TOPICAL

## 2020-01-14 MED ORDER — ACETAMINOPHEN 650 MG RE SUPP: 650.0000 mg | Freq: Four times a day (QID) | RECTAL | Status: DC | PRN

## 2020-01-14 MED ORDER — AMLODIPINE BESYLATE 5 MG PO TABS: 10.0000 mg | ORAL_TABLET | Freq: Every day | ORAL | Status: DC

## 2020-01-14 MED ORDER — QUETIAPINE FUMARATE 100 MG PO TABS
100.0000 mg | ORAL_TABLET | Freq: Every day | ORAL | Status: DC
  Administered 2020-01-14 – 2020-01-17 (×4): 100 mg via ORAL
  Filled 2020-01-14 (×5): qty 1

## 2020-01-14 MED ORDER — SODIUM CHLORIDE 0.9% FLUSH
3.0000 mL | Freq: Two times a day (BID) | INTRAVENOUS | Status: DC
  Administered 2020-01-14 – 2020-01-18 (×7): 3 mL via INTRAVENOUS

## 2020-01-14 MED ORDER — HYDRALAZINE HCL 20 MG/ML IJ SOLN: 10.0000 mg | INTRAMUSCULAR | Status: DC | PRN

## 2020-01-14 MED ORDER — LIDOCAINE-PRILOCAINE 2.5-2.5 % EX CREA
1.0000 | TOPICAL_CREAM | CUTANEOUS | Status: DC | PRN
Start: 2020-01-14 — End: 2020-01-14

## 2020-01-14 MED ORDER — SEVELAMER CARBONATE 800 MG PO TABS
4800.0000 mg | ORAL_TABLET | Freq: Three times a day (TID) | ORAL | Status: DC
  Administered 2020-01-14 – 2020-01-18 (×8): 4800 mg via ORAL
  Filled 2020-01-14 (×9): qty 6

## 2020-01-14 MED ORDER — LIDOCAINE-PRILOCAINE 2.5-2.5 % EX CREA: 1.0000 "application " | TOPICAL_CREAM | CUTANEOUS | Status: DC | PRN

## 2020-01-14 NOTE — ED Triage Notes (Signed)
Patient from home with increased shortness of breath in the last 6 hours.  Patient has history of COPD and dialysis.  Due for dialysis this morning.  Patient does have some chest tightness with breathing. No nausea or vomiting.  Patient with silent chest en route to ED per EMS.  Patient given 2gm Magnesium, 0.3mg  Epi, 125mg  solumedrol, 0.5mg  Atrovent and 10mg  of Albuterol en route to ED, breath sounds are course wheezing throughout chest.

## 2020-01-14 NOTE — ED Provider Notes (Addendum)
[facility] EMERGENCY DEPARTMENT Provider Note   CSN: [id number] Arrival date & time:        History Chief Complaint  Patient presents with  . Respiratory Distress    Raymond [name] is a 64 y.o. male.  The history is provided by the EMS personnel. The history is limited by the condition of the patient.  Shortness of Breath Severity:  Severe Onset quality:  Sudden Duration:  6 days Timing:  Constant Progression:  Unchanged Chronicity:  Recurrent Context: not activity and not fumes   Context comment:  Due for dialysis today Relieved by:  Nothing Worsened by:  Nothing Ineffective treatments:  None tried Associated symptoms: no abdominal pain and no fever   Risk factors: no recent alcohol use   Patient with ESRD/ polysubstance abuse/and h/o missed dialysis (TTS) with sudden onset SOB approximately 6 hours PTA.  Patient was given solumedrol, magnesium IV, and subcutaneous epinephrine by EMS.       Past Medical History:  Diagnosis Date  . Anemia   . CAD (coronary artery disease)    a. 03/2018: cath showing 20 to 30% stenosis along the LAD, luminal irregularities along the RCA, and angiographically normal LCx  . COPD (chronic obstructive pulmonary disease) ([place])   . Dyspnea    "with too much fluid"  . ESRD (end stage renal disease) on dialysis [facility])    "MWF; J[facility]en Rinks" (07/22/17)  . Hemodialysis patient ([place])   . Hepatitis C    Still positive s/p liver biopsy at [facility]  and interferon therapy for 6 months. Most recent lab work was on 10/24/12  . Hepatitis C    "took the tx; gone now" (12/05/2016)  Was treated  . History of blood transfusion ~ 2012/2013   "related to my kidneys; blood was low"  . Hypertension   . Substance abuse ([place])   . Thyroid disease     Patient Active Problem List   Diagnosis Date Noted  . Gastroenteritis 12/24/2018  . Substance use disorder 12/24/2018  . Noncompliance of patient with renal dialysis ([place]) 09/12/2018  .  Benign neoplasm of colon   . Melena   . Anemia due to chronic kidney disease, on chronic dialysis ([place])   . Acute on chronic anemia   . H/O medication noncompliance   . Polysubstance (excluding opioids) dependence ([place])   . Atrial flutter with rapid ventricular response ([place]) 08/10/2018  . PAF (paroxysmal atrial fibrillation) ([place]) 08/01/2018  . Acute respiratory failure with hypoxia ([place]) 07/13/2018  . Non-compliance with renal dialysis ([place]) 07/13/2018  . PAD (peripheral artery disease) ([place]) 04/11/2018  . Sepsis ([place]) 03/31/2018  . Preop cardiovascular exam 03/29/2018  . Volume overload 08/07/2017  . Acute pulmonary edema ([facility])   . Dyspnea 06/20/2017  . Acute bronchitis   . COPD (chronic obstructive pulmonary disease) ([place])   . End-stage renal disease on hemodialysis ([place])   . Hypoxia 12/05/2016  . Hyperkalemia 12/19/2015  . Hypoglycemia 12/19/2015  . Polysubstance abuse ([place]) 12/19/2015  . Homeless 12/19/2015  . Anemia associated with stage 5 chronic renal failure ([place]) 12/19/2015  . Atypical chest pain 12/19/2013  . Atherosclerosis of native arteries of extremity with intermittent claudication ([place]) 12/04/2012  . ESRD (end stage renal disease) on dialysis ([place]) 12/04/2012  . HEPATITIS C 09/07/2006  . HYPERCHOLESTEROLEMIA 09/07/2006  . HYPERTENSION, BENIGN SYSTEMIC 09/07/2006    Past Surgical History:  Procedure Laterality Date  . ABDOMINAL AORTOGRAM W/LOWER EXTREMITY N/A 02/08/2018   Procedure: ABDOMINAL AORTOGRAM W/LOWER  EXTREMITY;  Surgeon: Conrad [place], MD;  Location: [facility];  Service: Cardiovascular;  Laterality: N/A;  . AV FISTULA PLACEMENT Left Aug. 2013 ?  . COLONOSCOPY    . COLONOSCOPY WITH PROPOFOL N/A 08/21/2018   Procedure: COLONOSCOPY WITH PROPOFOL;  Surgeon: Yetta Flock, MD;  Location: [facility];  Service: Gastroenterology;  Laterality: N/A;  . ESOPHAGOGASTRODUODENOSCOPY (EGD) WITH PROPOFOL N/A 08/20/2018   Procedure:  ESOPHAGOGASTRODUODENOSCOPY (EGD) WITH PROPOFOL;  Surgeon: Gatha Mayer, MD;  Location: [facility];  Service: Endoscopy;  Laterality: N/A;  . FEMORAL-POPLITEAL BYPASS GRAFT Left 04/11/2018   Procedure: BYPASS GRAFT FEMORAL-POPLITEAL ARTERY LEFT LEG;  Surgeon: Rosetta Posner, MD;  Location: [facility];  Service: Vascular;  Laterality: Left;  . HOT HEMOSTASIS N/A 08/20/2018   Procedure: HOT HEMOSTASIS (ARGON PLASMA COAGULATION/BICAP);  Surgeon: Gatha Mayer, MD;  Location: [facility] ENDOSCOPY;  Service: Endoscopy;  Laterality: N/A;  . LEFT HEART CATH AND CORONARY ANGIOGRAPHY N/A 03/29/2018   Procedure: LEFT HEART CATH AND CORONARY ANGIOGRAPHY;  Surgeon: Nelva Bush, MD;  Location: [facility];  Service: Cardiovascular;  Laterality: N/A;  . LIVER BIOPSY    . ORIF ULNAR FRACTURE Left 05/18/2017   Procedure: OPEN REDUCTION INTERNAL FIXATION (ORIF) ULNAR FRACTURE;  Surgeon: Iran Planas, MD;  Location: [facility];  Service: Orthopedics;  Laterality: Left;  . POLYPECTOMY  08/21/2018   Procedure: POLYPECTOMY;  Surgeon: Yetta Flock, MD;  Location: [facility] ENDOSCOPY;  Service: Gastroenterology;;  . SHUNTOGRAM N/A 12/11/2012   Procedure: Earney Mallet;  Surgeon: Serafina Mitchell, MD;  Location: [facility] CATH LAB;  Service: Cardiovascular;  Laterality: N/A;       Family History  Problem Relation Age of Onset  . Diabetes Father   . Hypertension Father   . Heart disease Father     Social History   Tobacco Use  . Smoking status: Current Every Day Smoker    Packs/day: 1.00    Years: 25.00    Pack years: 25.00    Types: Cigarettes    Last attempt to quit: 08/01/2011    Years since quitting: 8.4  . Smokeless tobacco: Never Used  . Tobacco comment: he smokes 6 cigarettes a week  Vaping Use  . Vaping Use: Never used  Substance Use Topics  . Alcohol use: Yes    Comment: occasionally/ one half pint  . Drug use: Not Currently    Types: Cocaine    Home Medications Prior to Admission medications     Medication Sig Start Date End Date Taking? Authorizing Provider  amiodarone (PACERONE) 200 MG tablet Take 1 tablet (200 mg total) by mouth daily. 09/14/18   Monica Becton, MD  bismuth subsalicylate (PEPTO BISMOL) 262 MG chewable tablet Chew 524 mg by mouth as needed for indigestion or diarrhea or loose stools.    [provider]  sevelamer carbonate (RENVELA) 800 MG tablet Take 4 tablets (3,200 mg total) by mouth 3 (three) times daily with meals. Patient taking differently: Take 4,800 mg by mouth 3 (three) times daily with meals. Six tablets with each meal 09/14/18   Monica Becton, MD    Allergies    Patient has no known allergies.  Review of Systems   Review of Systems  Unable to perform ROS: Acuity of condition  Constitutional: Negative for fever.  Respiratory: Positive for shortness of breath.   Gastrointestinal: Negative for abdominal pain.    Physical Exam Updated Vital Signs Resp (!) 23   Physical Exam Vitals and nursing note reviewed.  Exam conducted with a chaperone present.  Constitutional:      General: He is in acute distress.  HENT:     Head: Normocephalic and atraumatic.     Nose: Nose normal.  Eyes:     Conjunctiva/sclera: Conjunctivae normal.     Pupils: Pupils are equal, round, and reactive to light.  Cardiovascular:     Rate and Rhythm: Normal rate and regular rhythm.     Pulses: Normal pulses.     Heart sounds: Normal heart sounds.  Pulmonary:     Effort: Tachypnea, accessory muscle usage and respiratory distress present.     Breath sounds: Rales present.  Abdominal:     General: Abdomen is flat. Bowel sounds are normal.     Tenderness: There is no abdominal tenderness. There is no guarding.  Genitourinary:    Rectum: Guaiac result positive.  Musculoskeletal:        General: Normal range of motion.     Cervical back: Normal range of motion and neck supple.  Skin:    General: Skin is warm and dry.     Capillary Refill: Capillary refill  takes less than 2 seconds.  Neurological:     General: No focal deficit present.     Mental Status: He is alert.     Deep Tendon Reflexes: Reflexes normal.  Psychiatric:     Comments: Unable      ED Results / Procedures / Treatments   Labs (all labs ordered are listed, but only abnormal results are displayed) Results for orders placed or performed during the hospital encounter of 01/14/20  CBC with Differential/Platelet  Result Value Ref Range   WBC 5.9 4.0 - 10.5 K/uL   RBC 1.70 (L) 4.22 - 5.81 MIL/uL   Hemoglobin 5.4 (LL) 13.0 - 17.0 g/dL   HCT 18.4 (L) 39 - 52 %   MCV 108.2 (H) 80.0 - 100.0 fL   MCH 31.8 26.0 - 34.0 pg   MCHC 29.3 (L) 30.0 - 36.0 g/dL   RDW 17.3 (H) 11.5 - 15.5 %   Platelets 140 (L) 150 - 400 K/uL   nRBC 0.0 0.0 - 0.2 %   Neutrophils Relative % 50 %   Neutro Abs 3.0 1.7 - 7.7 K/uL   Lymphocytes Relative 34 %   Lymphs Abs 2.0 0.7 - 4.0 K/uL   Monocytes Relative 15 %   Monocytes Absolute 0.9 0 - 1 K/uL   Eosinophils Relative 1 %   Eosinophils Absolute 0.1 0 - 0 K/uL   Basophils Relative 0 %   Basophils Absolute 0.0 0 - 0 K/uL   Immature Granulocytes 0 %   Abs Immature Granulocytes 0.02 0.00 - 0.07 K/uL  I-stat chem 8, ED (not at [facility] or [facility])  Result Value Ref Range   Sodium 139 135 - 145 mmol/L   Potassium 5.1 3.5 - 5.1 mmol/L   Chloride 98 98 - 111 mmol/L   BUN 127 (H) 8 - 23 mg/dL   Creatinine, Ser 15.10 (H) 0.61 - 1.24 mg/dL   Glucose, Bld 145 (H) 70 - 99 mg/dL   Calcium, Ion 1.15 1.15 - 1.40 mmol/L   TCO2 29 22 - 32 mmol/L   Hemoglobin 6.5 (LL) 13.0 - 17.0 g/dL   HCT 19.0 (L) 39 - 52 %   DG Chest Portable 1 View  Result Date: 01/14/2020 CLINICAL DATA:  Shortness of breath.  History of COPD and dialysis. EXAM: PORTABLE CHEST 1 VIEW COMPARISON:  05/28/2019. FINDINGS: Cardiomegaly with pulmonary  venous congestion and bilateral interstitial prominence. Tiny bilateral pleural effusions. These findings are most consistent with CHF. Pneumonitis  cannot be excluded. No pneumothorax. No acute bony abnormality. IMPRESSION: Cardiomegaly with pulmonary venous congestion and bilateral interstitial prominence. Tiny bilateral pleural effusions. Findings most consistent with CHF. Electronically Signed   By: Marcello Moores  Register   On: 01/14/2020 06:24    EKG EKG Interpretation  Date/Time:  Tuesday January 14 2020 05:55:12 EDT Ventricular Rate:  103 PR Interval:    QRS Duration: 95 QT Interval:  370 QTC Calculation: 485 R Axis:   56 Text Interpretation: Sinus tachycardia RSR' in V1 or V2, probably normal variant Confirmed by Randal Buba, Muhsin Doris ([id number]) on 01/14/2020 6:05:06 AM   Radiology No results found.  Procedures Procedures (including critical care time)  Medications Ordered in ED Medications  nitroGLYCERIN (NITROGLYN) 2 % ointment 1 inch (1 inch Topical Given 01/14/20 0616)    ED Course  I have reviewed the triage vital signs and the nursing notes.  Pertinent labs & imaging results that were available during my care of the patient were reviewed by me and considered in my medical decision making (see chart for details).    MDM Reviewed: previous chart, nursing note and vitals Reviewed previous: labs Interpretation: x-ray, labs and ECG (pulmonary edema 5 gram drop of hempglobin, anemia ) Total time providing critical care: 30-74 minutes (bipap by me for respiratory distress, protonix drip. blood ordered ). This excludes time spent performing separately reportable procedures and services. Consults: admitting MD (nephrology Dr. Augustin Coupe who will see the patient for emergent dialysis )  CRITICAL CARE Performed by: Lauralei Clouse K Rosalie Gelpi-Rasch Total critical care time: 60 minutes Critical care time was exclusive of separately billable procedures and treating other patients. Critical care was necessary to treat or prevent imminent or life-threatening deterioration. Critical care was time spent personally by me on the following activities: development of  treatment plan with patient and/or surrogate as well as nursing, discussions with consultants, evaluation of patient's response to treatment, examination of patient, obtaining history from patient or surrogate, ordering and performing treatments and interventions, ordering and review of laboratory studies, ordering and review of radiographic studies, pulse oximetry and re-evaluation of patient's condition.   610 Case d/w Dr. Augustin Coupe who will see the patient for dialysis   630 case d/w Dr. Hal Hope, blood ordered due 5 gram drop.  Protonix drip ordered due to frank melena on the bed sheets.  I have informed Dr. Hal Hope that the team will need to cycle troponins given that Epinephrine was given by EMS.   First troponin is mildly elevated at 32 but will need to be trended.  Is on nitroglycerin ointment for BP  Final Clinical Impression(s) / ED Diagnoses Admit to medicine with nephrology consult for dialysis.     Rosalynd Mcwright, MD 01/14/20 [place], Roselyne Stalnaker, MD 01/14/20 3212

## 2020-01-14 NOTE — Consult Note (Addendum)
Consultation  Referring Provider: Dr. Tamala Julian     Primary Care Physician:  [name], FNP Primary Gastroenterologist: Dr. Havery Moros        Reason for Consultation: Anemia, melena         HPI:   Raymond [name] is a 64 y.o. male with a past medical history as listed below including CAD, COPD, ESRD on HD MWF, hepatitis C status post treatment, PAF (not anticoagulated), intestinal AVMs and multiple others listed below who presented to the ER yesterday with a complaint of respiratory distress.  We are consulted in regards to anemia and melena.    Today, patient seen during hemodialysis, the patient reports that about 2 weeks ago he had a black tarry sticky stool stool that looked like "oil", then it returned to brown.  Patient tells me he had not noticed any further melena but was told that he had an incident in the ambulance.  Overall he just feels weak and tired.  Does describe some lower abdominal pain from his bellybutton down to the bottom which is kind of crampy in nature.  Denies nausea, vomiting, heartburn or reflux.    Denies fever, chills, weight loss or symptoms that awaken him from sleep.  ED course: Hypertensive 197/92, respirations 16-25, labs with a hemoglobin of 5.4, potassium 5.3, BUN 125, creatinine 14.5, troponin 32, stool guaiac positive, 2 units PRBCs ordered due to frank melena on the bed sheets  GI history: 08/16/2019 office visit [name], NP: Discussed recurrent anemia and Hemoccult positive stools, nonbleeding duodenal AVMs have been treated and colon polyps on EGD/colonoscopy February 2020, video capsule endoscopy was ordered for recurrent anemia (cannot find this in the patient's chart-not sure if completed) February 2020: EGD with 4 nonbleeding angiodysplastic lesions in the duodenal bulb treated with APC, colonoscopy with adequate prep remarkable for diverticulosis, hemorrhoids and several small polyps which were removed and found to be adenomatous  Past  Medical History:  Diagnosis Date  . Anemia   . CAD (coronary artery disease)    a. 03/2018: cath showing 20 to 30% stenosis along the LAD, luminal irregularities along the RCA, and angiographically normal LCx  . COPD (chronic obstructive pulmonary disease) ([place])   . Dyspnea    "with too much fluid"  . ESRD (end stage renal disease) on dialysis [facility])    "MWF; Jeneen Rinks" (07/22/17)  . Hemodialysis patient ([place])   . Hepatitis C    Still positive s/p liver biopsy at [facility]  and interferon therapy for 6 months. Most recent lab work was on 10/24/12  . Hepatitis C    "took the tx; gone now" (12/05/2016)  Was treated  . History of blood transfusion ~ 2012/2013   "related to my kidneys; blood was low"  . Hypertension   . Substance abuse ([place])   . Thyroid disease     Past Surgical History:  Procedure Laterality Date  . ABDOMINAL AORTOGRAM W/LOWER EXTREMITY N/A 02/08/2018   Procedure: ABDOMINAL AORTOGRAM W/LOWER EXTREMITY;  Surgeon: Conrad Woodburn, MD;  Location: [place] CV LAB;  Service: Cardiovascular;  Laterality: N/A;  . AV FISTULA PLACEMENT Left Aug. 2013 ?  . COLONOSCOPY    . COLONOSCOPY WITH PROPOFOL N/A 08/21/2018   Procedure: COLONOSCOPY WITH PROPOFOL;  Surgeon: Yetta Flock, MD;  Location: [place];  Service: Gastroenterology;  Laterality: N/A;  . ESOPHAGOGASTRODUODENOSCOPY (EGD) WITH PROPOFOL N/A 08/20/2018   Procedure: ESOPHAGOGASTRODUODENOSCOPY (EGD) WITH PROPOFOL;  Surgeon: Gatha Mayer, MD;  Location: [facility] ENDOSCOPY;  Service: Endoscopy;  Laterality: N/A;  . FEMORAL-POPLITEAL BYPASS GRAFT Left 04/11/2018   Procedure: BYPASS GRAFT FEMORAL-POPLITEAL ARTERY LEFT LEG;  Surgeon: Rosetta Posner, MD;  Location: [place];  Service: Vascular;  Laterality: Left;  . HOT HEMOSTASIS N/A 08/20/2018   Procedure: HOT HEMOSTASIS (ARGON PLASMA COAGULATION/BICAP);  Surgeon: Gatha Mayer, MD;  Location: [facility] ENDOSCOPY;  Service: Endoscopy;  Laterality: N/A;  . LEFT HEART CATH AND CORONARY  ANGIOGRAPHY N/A 03/29/2018   Procedure: LEFT HEART CATH AND CORONARY ANGIOGRAPHY;  Surgeon: Nelva Bush, MD;  Location: [place] CV LAB;  Service: Cardiovascular;  Laterality: N/A;  . LIVER BIOPSY    . ORIF ULNAR FRACTURE Left 05/18/2017   Procedure: OPEN REDUCTION INTERNAL FIXATION (ORIF) ULNAR FRACTURE;  Surgeon: Iran Planas, MD;  Location: [place];  Service: Orthopedics;  Laterality: Left;  . POLYPECTOMY  08/21/2018   Procedure: POLYPECTOMY;  Surgeon: Yetta Flock, MD;  Location: [facility] ENDOSCOPY;  Service: Gastroenterology;;  . SHUNTOGRAM N/A 12/11/2012   Procedure: Earney Mallet;  Surgeon: Serafina Mitchell, MD;  Location: [facility] CATH LAB;  Service: Cardiovascular;  Laterality: N/A;    Family History  Problem Relation Age of Onset  . Diabetes Father   . Hypertension Father   . Heart disease Father      Social History   Tobacco Use  . Smoking status: Current Every Day Smoker    Packs/day: 1.00    Years: 25.00    Pack years: 25.00    Types: Cigarettes    Last attempt to quit: 08/01/2011    Years since quitting: 8.4  . Smokeless tobacco: Never Used  . Tobacco comment: he smokes 6 cigarettes a week  Vaping Use  . Vaping Use: Never used  Substance Use Topics  . Alcohol use: Yes    Comment: occasionally/ one half pint  . Drug use: Not Currently    Types: Cocaine    Prior to Admission medications   Medication Sig Start Date End Date Taking? Authorizing Provider  albuterol (VENTOLIN HFA) 108 (90 Base) MCG/ACT inhaler Inhale 1-2 puffs into the lungs every 4 (four) hours as needed for shortness of breath. 11/06/19  Yes [provider]  amLODipine (NORVASC) 10 MG tablet Take 10 mg by mouth daily. Take with 2.5mg  for a total dose of 12.5mg . 12/25/19  Yes [provider]  amLODipine (NORVASC) 2.5 MG tablet Take 2.5 mg by mouth daily. Take with 10mg  for a total dose of 12.5mg . 01/01/20  Yes [provider]  bismuth subsalicylate (PEPTO BISMOL) 262 MG chewable  tablet Chew 524 mg by mouth as needed for indigestion or diarrhea or loose stools.   Yes [provider]  metoprolol succinate (TOPROL-XL) 50 MG 24 hr tablet Take 50 mg by mouth 2 (two) times daily. 01/02/20  Yes [provider]  QUEtiapine (SEROQUEL) 100 MG tablet Take 100 mg by mouth at bedtime. 01/01/20  Yes [provider]  sevelamer carbonate (RENVELA) 800 MG tablet Take 4 tablets (3,200 mg total) by mouth 3 (three) times daily with meals. Patient taking differently: Take 4,800 mg by mouth 3 (three) times daily with meals. Six tablets with each meal 09/14/18  Yes Vasireddy, Grier Mitts, MD  amiodarone (PACERONE) 200 MG tablet Take 1 tablet (200 mg total) by mouth daily. Patient not taking: Reported on 01/14/2020 09/14/18   Monica Becton, MD    Current Facility-Administered Medications  Medication Dose Route Frequency Provider Last Rate Last Admin  . 0.9 %  sodium chloride  infusion  100 mL Intravenous PRN Smith, Rondell A, MD      . 0.9 %  sodium chloride infusion  100 mL Intravenous PRN Tamala Julian, Rondell A, MD      . acetaminophen (TYLENOL) tablet 650 mg  650 mg Oral Q6H PRN Norval Morton, MD       Or  . acetaminophen (TYLENOL) suppository 650 mg  650 mg Rectal Q6H PRN Smith, Rondell A, MD      . alteplase (CATHFLO ACTIVASE) injection 2 mg  2 mg Intracatheter Once PRN Fuller Plan A, MD      . amLODipine (NORVASC) tablet 12.5 mg  12.5 mg Oral Daily Smith, Rondell A, MD      . bismuth subsalicylate (PEPTO BISMOL) chewable tablet 524 mg  524 mg Oral PRN Fuller Plan A, MD      . Chlorhexidine Gluconate Cloth 2 % PADS 6 each  6 each Topical Q0600 Smith, Rondell A, MD      . heparin injection 1,000 Units  1,000 Units Dialysis PRN Fuller Plan A, MD      . hydrALAZINE (APRESOLINE) injection 10 mg  10 mg Intravenous Q4H PRN Smith, Rondell A, MD      . ipratropium-albuterol (DUONEB) 0.5-2.5 (3) MG/3ML nebulizer solution 3 mL  3 mL Nebulization QID Fuller Plan A, MD    3 mL at 01/14/20 0847  . lidocaine (PF) (XYLOCAINE) 1 % injection 5 mL  5 mL Intradermal PRN Tamala Julian, Rondell A, MD      . lidocaine-prilocaine (EMLA) cream 1 application  1 application Topical PRN Tamala Julian, Rondell A, MD      . ondansetron (ZOFRAN) tablet 4 mg  4 mg Oral Q6H PRN Fuller Plan A, MD       Or  . ondansetron (ZOFRAN) injection 4 mg  4 mg Intravenous Q6H PRN Smith, Rondell A, MD      . pantoprazole (PROTONIX) 80 mg in sodium chloride 0.9 % 100 mL (0.8 mg/mL) infusion  8 mg/hr Intravenous Continuous Fuller Plan A, MD 10 mL/hr at 01/14/20 0843 8 mg/hr at 01/14/20 0843  . [START ON 01/17/2020] pantoprazole (PROTONIX) injection 40 mg  40 mg Intravenous Q12H Smith, Rondell A, MD      . pentafluoroprop-tetrafluoroeth (GEBAUERS) aerosol 1 application  1 application Topical PRN Tamala Julian, Rondell A, MD      . QUEtiapine (SEROQUEL) tablet 100 mg  100 mg Oral QHS Smith, Rondell A, MD      . sevelamer carbonate (RENVELA) tablet 4,800 mg  4,800 mg Oral TID WC Smith, Rondell A, MD      . sodium chloride flush (NS) 0.9 % injection 3 mL  3 mL Intravenous Q12H Norval Morton, MD       Current Outpatient Medications  Medication Sig Dispense Refill  . albuterol (VENTOLIN HFA) 108 (90 Base) MCG/ACT inhaler Inhale 1-2 puffs into the lungs every 4 (four) hours as needed for shortness of breath.    [name] amLODipine (NORVASC) 10 MG tablet Take 10 mg by mouth daily. Take with 2.5mg  for a total dose of 12.5mg .    . amLODipine (NORVASC) 2.5 MG tablet Take 2.5 mg by mouth daily. Take with 10mg  for a total dose of 12.5mg .    . bismuth subsalicylate (PEPTO BISMOL) 262 MG chewable tablet Chew 524 mg by mouth as needed for indigestion or diarrhea or loose stools.    . metoprolol succinate (TOPROL-XL) 50 MG 24 hr tablet Take 50 mg by mouth 2 (two) times daily.    [name]  QUEtiapine (SEROQUEL) 100 MG tablet Take 100 mg by mouth at bedtime.    . sevelamer carbonate (RENVELA) 800 MG tablet Take 4 tablets (3,200 mg total) by mouth  3 (three) times daily with meals. (Patient taking differently: Take 4,800 mg by mouth 3 (three) times daily with meals. Six tablets with each meal) 120 tablet 0  . amiodarone (PACERONE) 200 MG tablet Take 1 tablet (200 mg total) by mouth daily. (Patient not taking: Reported on 01/14/2020) 30 tablet 0   Facility-Administered Medications Ordered in Other Encounters  Medication Dose Route Frequency Provider Last Rate Last Admin  . midazolam (VERSED) 5 MG/5ML injection    Anesthesia Intra-op Sammie Bench, CRNA   2 mg at 04/11/18 1042    Allergies as of 01/14/2020  . (No Known Allergies)     Review of Systems:    Constitutional: No weight loss, fever or chills Skin: No rash  Cardiovascular: No chest pain Respiratory: +shortness of breath Gastrointestinal: See HPI and otherwise negative Genitourinary: No dysuria or change in urinary frequency Neurological: +dizziness Musculoskeletal: No new muscle or joint pain Hematologic: No bruising Psychiatric: No history of depression or anxiety    Physical Exam:  Vital signs in last 24 hours: Temp:  [97.2 F (36.2 C)-97.9 F (36.6 C)] 97.6 F (36.4 C) (07/06 1200) Pulse Rate:  [88-106] 95 (07/06 1200) Resp:  [13-25] 14 (07/06 1200) BP: (115-197)/(70-96) 127/77 (07/06 1200) SpO2:  [100 %] 100 % (07/06 1130) FiO2 (%):  [40 %] 40 % (07/06 0847)   General:   Pleasant chronically ill appearing AA male appears to be in NAD, Well developed, Well nourished, alert and cooperative Head:  Normocephalic and atraumatic. Eyes:   PEERL, EOMI. No icterus. Conjunctiva pink. Ears:  Normal auditory acuity. Neck:  Supple Throat: Oral cavity and pharynx without inflammation, swelling or lesion. Lungs: Respirations even and unlabored. Lungs clear to auscultation bilaterally.   No wheezes, crackles, or rhonchi.  Heart: Normal S1, S2. No MRG. Regular rate and rhythm. No peripheral edema, cyanosis or pallor.  Abdomen:  Soft, nondistended, mild lower  abdominal ttp. No rebound or guarding. Normal bowel sounds. No appreciable masses or hepatomegaly. Rectal:  Not performed.(frank melena in ambulance)  Msk:  Symmetrical without gross deformities. Peripheral pulses intact.  Extremities:  Without edema, no deformity or joint abnormality.  Neurologic:  Alert and  oriented x4;  grossly normal neurologically.  Skin:   Dry and intact without significant lesions or rashes. Psychiatric: Demonstrates good judgement and reason without abnormal affect or behaviors.   LAB RESULTS: Recent Labs    01/14/20 0600 01/14/20 0607 01/14/20 0753  WBC 5.9  --  3.1*  HGB 5.4* 6.5* 5.6*  HCT 18.4* 19.0* 18.9*  PLT 140*  --  120*   BMET Recent Labs    01/14/20 0600 01/14/20 0607 01/14/20 0753  NA 140 139 140  K 5.3* 5.1 5.5*  CL 96* 98 96*  CO2 25  --  23  GLUCOSE 147* 145* 110*  BUN 125* 127* 125*  CREATININE 14.51* 15.10* 14.62*  CALCIUM 9.6  --  9.6   LFT Recent Labs    01/14/20 0753  ALBUMIN 3.3*   STUDIES: DG Chest Portable 1 View  Result Date: 01/14/2020 CLINICAL DATA:  Shortness of breath.  History of COPD and dialysis. EXAM: PORTABLE CHEST 1 VIEW COMPARISON:  05/28/2019. FINDINGS: Cardiomegaly with pulmonary venous congestion and bilateral interstitial prominence. Tiny bilateral pleural effusions. These findings are most consistent with CHF. Pneumonitis cannot be  excluded. No pneumothorax. No acute bony abnormality. IMPRESSION: Cardiomegaly with pulmonary venous congestion and bilateral interstitial prominence. Tiny bilateral pleural effusions. Findings most consistent with CHF. Electronically Signed   By: Marcello Moores  Register   On: 01/14/2020 06:24    Impression / Plan:   Impression: 1.  Acute respiratory failure/COPD exacerbation: On o2 via [place] 2.  GI bleed with acute on chronic anemia and melena: History of small bowel AVMs, hemoglobin 5.4 on admission with melena, 2 units PRBCs ordered 3.  Acute pulmonary edema and ESRD on HD 4.   Hypertensive urgency 5.  Elevated troponin: 32 at admission 6.  Hyperkalemia 7.  History of paroxysmal A. fib: Not on anticoagulation 8.  Thrombocytopenia: Chronic platelet count of 140 9.  Polysubstance abuse: History of tobacco, alcohol and cocaine use  Plan: 1.  Patient is currently undergoing hemodialysis, 2 units PRBCs have been ordered for after this is finished, likely his bleeding is from AVMs which have been visualized previously. 2.  Discussed that the patient's troponins are elevated and he is also having an increase in respiratory effort.  He would likely benefit from a repeat EGD/enteroscopy while here in the hospital, but I will leave timing up to Dr. Lyndel Safe.  Certainly will need to get his hemoglobin above 7 unless there are further signs of acute GI bleeding opr he becomes unstable requiring emergent procedure. 3.  Continue to monitor hemoglobin with transfusion as this is a 7 4.  Agree with Protonix drip 5.  Please await any further recommendations from Dr. Lyndel Safe later today.  Thank you for your kind consultation, we will continue to follow.  Raymond [name]  01/14/2020, 12:10 PM     Attending physician's note   I have reviewed the chart. Pt was sleeping. I did not wake him up. I agree with the Advanced Practitioner's note, impression and recommendations.   64yr old with ESRD on HD (TTS), known duodenal AVMs, A Fib (not on AC but on amiodarone), polysubstance abuse (ETOH/cocaine/tobacco), H/O hep C s/p treatment with SVR 11/2016 (no cirrhosis but has thrombocytopenia), CAD, HTN, PVD, PAD  Adm d/t acute on chronic resp failure requiring BIPAP (d/t COPD/pum edema/CHF)  Melena with Hb 5.4 (baseline 8-9). EGD Feb 2020 with duodenal AVMs s/p APC). Neg colon Feb 2020 except for small TAs and diverticulosis. Had ?VCE- report NA)  Elevated troponin.   Plan: -IV protonix. -Agree with 2U PRBC. -Trend CBC. Transfuse to keep Hb>7. -Has pancytopenia with elevated MCV (ETOH,  H/O hep C, amiodarone) and GI bleed with PAD, recommend CTA  Abdo/pelvis (pl co-ordinate with HD). At very high risk for liver cirrhosis. -Check LFTs, PT INR, AFP, B12/folate -Recommend push enteroscopy when OK from resp/cardiac standpoint. -Will check our database (in AM) to find out if he had capsule endo done in past. -Will follow along.    Carmell Austria, MD Velora Heckler Fabienne Bruns [phone].

## 2020-01-14 NOTE — Progress Notes (Signed)
Pt not in need of bipap at this time. Pt resting comfortably, vitals stable. Rt will continue to monitor as needed.

## 2020-01-14 NOTE — Progress Notes (Signed)
BiPAP patient transported from ED 21 to Hemodialysis without any complications.

## 2020-01-14 NOTE — Consult Note (Addendum)
[name] Renal Consultation Note    Indication for Consultation:  Management of ESRD/hemodialysis; anemia, hypertension/volume and secondary hyperparathyroidism   HPI: Raymond [name] is a 64 y.o. male  ESRD on HD TTS [place]. PMH: HTN, Hep C s/p treatment CAD, PVD, PAFib (not on anticoagulation d/t prior GI bleeding).  He presented to ED with worsening SOB. Hypoxic on arrival and required BiPAP. Severe anemia with Hgb 5.4. CXR consistent with pulm edema/CHF. Labs Na 140, K 5.5, Glu 110, WBC 3.1, +FOBT. Covid testing negative. Transfused 2 units prbcs and nephrology consulted for urgent dialysis.   Seen in HD unit.  UF goal 4.5L via LUE AVF.  Remains on BiPAP, unable to give much history. Last dialysis was 7/3 for 1 hour 49min.He left 5.9 kg over dry weight. Frequently misses or cuts treatment short.   Past Medical History:  Diagnosis Date  . Anemia   . CAD (coronary artery disease)    a. 03/2018: cath showing 20 to 30% stenosis along the LAD, luminal irregularities along the RCA, and angiographically normal LCx  . COPD (chronic obstructive pulmonary disease) ([place])   . Dyspnea    "with too much fluid"  . ESRD (end stage renal disease) on dialysis [facility])    "MWF; Jeneen Rinks" (07/22/17)  . Hemodialysis patient ([place])   . Hepatitis C    Still positive s/p liver biopsy at [facility]  and interferon therapy for 6 months. Most recent lab work was on 10/24/12  . Hepatitis C    "took the tx; gone now" (12/05/2016)  Was treated  . History of blood transfusion ~ 2012/2013   "related to my kidneys; blood was low"  . Hypertension   . Substance abuse ([facility])   . Thyroid disease    Past Surgical History:  Procedure Laterality Date  . ABDOMINAL AORTOGRAM W/LOWER EXTREMITY N/A 02/08/2018   Procedure: ABDOMINAL AORTOGRAM W/LOWER EXTREMITY;  Surgeon: Conrad Bradley Beach, MD;  Location: [place] CV [facility];  Service: Cardiovascular;  Laterality: N/A;  . AV FISTULA PLACEMENT Left Aug.  2013 ?  . COLONOSCOPY    . COLONOSCOPY WITH PROPOFOL N/A 08/21/2018   Procedure: COLONOSCOPY WITH PROPOFOL;  Surgeon: Yetta Flock, MD;  Location: [place];  Service: Gastroenterology;  Laterality: N/A;  . ESOPHAGOGASTRODUODENOSCOPY (EGD) WITH PROPOFOL N/A 08/20/2018   Procedure: ESOPHAGOGASTRODUODENOSCOPY (EGD) WITH PROPOFOL;  Surgeon: Gatha Mayer, MD;  Location: [place];  Service: Endoscopy;  Laterality: N/A;  . FEMORAL-POPLITEAL BYPASS GRAFT Left 04/11/2018   Procedure: BYPASS GRAFT FEMORAL-POPLITEAL ARTERY LEFT LEG;  Surgeon: Rosetta Posner, MD;  Location: [place];  Service: Vascular;  Laterality: Left;  . HOT HEMOSTASIS N/A 08/20/2018   Procedure: HOT HEMOSTASIS (ARGON PLASMA COAGULATION/BICAP);  Surgeon: Gatha Mayer, MD;  Location: [facility] ENDOSCOPY;  Service: Endoscopy;  Laterality: N/A;  . LEFT HEART CATH AND CORONARY ANGIOGRAPHY N/A 03/29/2018   Procedure: LEFT HEART CATH AND CORONARY ANGIOGRAPHY;  Surgeon: Nelva Bush, MD;  Location: [place] [facility];  Service: Cardiovascular;  Laterality: N/A;  . LIVER BIOPSY    . ORIF ULNAR FRACTURE Left 05/18/2017   Procedure: OPEN REDUCTION INTERNAL FIXATION (ORIF) ULNAR FRACTURE;  Surgeon: Iran Planas, MD;  Location: [place];  Service: Orthopedics;  Laterality: Left;  . POLYPECTOMY  08/21/2018   Procedure: POLYPECTOMY;  Surgeon: Yetta Flock, MD;  Location: [facility] ENDOSCOPY;  Service: Gastroenterology;;  . SHUNTOGRAM N/A 12/11/2012   Procedure: Earney Mallet;  Surgeon: Serafina Mitchell, MD;  Location: [facility] CATH LAB;  Service: Cardiovascular;  Laterality: N/A;   Family History  Problem Relation Age of Onset  . Diabetes Father   . Hypertension Father   . Heart disease Father    Social History:  reports that he has been smoking cigarettes. He has a 25.00 pack-year smoking history. He has never used smokeless tobacco. He reports current alcohol use. He reports previous drug use. Drug: Cocaine. No Known Allergies Prior to Admission  medications   Medication Sig Start Date End Date Taking? Authorizing Provider  albuterol (VENTOLIN HFA) 108 (90 Base) MCG/ACT inhaler Inhale 1-2 puffs into the lungs every 4 (four) hours as needed for shortness of breath. 11/06/19  Yes [provider]  amLODipine (NORVASC) 10 MG tablet Take 10 mg by mouth daily. Take with 2.5mg  for a total dose of 12.5mg . 12/25/19  Yes [provider]  amLODipine (NORVASC) 2.5 MG tablet Take 2.5 mg by mouth daily. Take with 10mg  for a total dose of 12.5mg . 01/01/20  Yes [provider]  bismuth subsalicylate (PEPTO BISMOL) 262 MG chewable tablet Chew 524 mg by mouth as needed for indigestion or diarrhea or loose stools.   Yes [provider]  metoprolol succinate (TOPROL-XL) 50 MG 24 hr tablet Take 50 mg by mouth 2 (two) times daily. 01/02/20  Yes [provider]  QUEtiapine (SEROQUEL) 100 MG tablet Take 100 mg by mouth at bedtime. 01/01/20  Yes [provider]  sevelamer carbonate (RENVELA) 800 MG tablet Take 4 tablets (3,200 mg total) by mouth 3 (three) times daily with meals. Patient taking differently: Take 4,800 mg by mouth 3 (three) times daily with meals. Six tablets with each meal 09/14/18  Yes Vasireddy, Grier Mitts, MD  amiodarone (PACERONE) 200 MG tablet Take 1 tablet (200 mg total) by mouth daily. Patient not taking: Reported on 01/14/2020 09/14/18   Monica Becton, MD   Current Facility-Administered Medications  Medication Dose Route Frequency Provider Last Rate Last Admin  . 0.9 %  sodium chloride infusion  100 mL Intravenous PRN Smith, Rondell A, MD      . 0.9 %  sodium chloride infusion  100 mL Intravenous PRN Tamala Julian, Rondell A, MD      . acetaminophen (TYLENOL) tablet 650 mg  650 mg Oral Q6H PRN Norval Morton, MD       Or  . acetaminophen (TYLENOL) suppository 650 mg  650 mg Rectal Q6H PRN Smith, Rondell A, MD      . alteplase (CATHFLO ACTIVASE) injection 2 mg  2 mg Intracatheter Once PRN Fuller Plan A, MD      . amLODipine (NORVASC) tablet 12.5 mg  12.5 mg Oral Daily Smith, Rondell A, MD      . bismuth subsalicylate (PEPTO BISMOL) chewable tablet 524 mg  524 mg Oral PRN Fuller Plan A, MD      . Chlorhexidine Gluconate Cloth 2 % PADS 6 each  6 each Topical Q0600 Smith, Rondell A, MD      . heparin injection 1,000 Units  1,000 Units Dialysis PRN Fuller Plan A, MD      . hydrALAZINE (APRESOLINE) injection 10 mg  10 mg Intravenous Q4H PRN Smith, Rondell A, MD      . ipratropium-albuterol (DUONEB) 0.5-2.5 (3) MG/3ML nebulizer solution 3 mL  3 mL Nebulization QID Fuller Plan A, MD   3 mL at 01/14/20 0847  . lidocaine (PF) (XYLOCAINE) 1 % injection 5 mL  5 mL Intradermal PRN Norval Morton, MD      .  lidocaine-prilocaine (EMLA) cream 1 application  1 application Topical PRN Tamala Julian, Rondell A, MD      . ondansetron (ZOFRAN) tablet 4 mg  4 mg Oral Q6H PRN Fuller Plan A, MD       Or  . ondansetron (ZOFRAN) injection 4 mg  4 mg Intravenous Q6H PRN Smith, Rondell A, MD      . pantoprazole (PROTONIX) 80 mg in sodium chloride 0.9 % 100 mL (0.8 mg/mL) infusion  8 mg/hr Intravenous Continuous Fuller Plan A, MD 10 mL/hr at 01/14/20 0843 8 mg/hr at 01/14/20 0843  . [START ON 01/17/2020] pantoprazole (PROTONIX) injection 40 mg  40 mg Intravenous Q12H Smith, Rondell A, MD      . pentafluoroprop-tetrafluoroeth (GEBAUERS) aerosol 1 application  1 application Topical PRN Tamala Julian, Rondell A, MD      . QUEtiapine (SEROQUEL) tablet 100 mg  100 mg Oral QHS Smith, Rondell A, MD      . sevelamer carbonate (RENVELA) tablet 4,800 mg  4,800 mg Oral TID WC Smith, Rondell A, MD      . sodium chloride flush (NS) 0.9 % injection 3 mL  3 mL Intravenous Q12H Norval Morton, MD       Current Outpatient Medications  Medication Sig Dispense Refill  . albuterol (VENTOLIN HFA) 108 (90 Base) MCG/ACT inhaler Inhale 1-2 puffs into the lungs every 4 (four) hours as needed for shortness of breath.    [name] amLODipine  (NORVASC) 10 MG tablet Take 10 mg by mouth daily. Take with 2.5mg  for a total dose of 12.5mg .    . amLODipine (NORVASC) 2.5 MG tablet Take 2.5 mg by mouth daily. Take with 10mg  for a total dose of 12.5mg .    . bismuth subsalicylate (PEPTO BISMOL) 262 MG chewable tablet Chew 524 mg by mouth as needed for indigestion or diarrhea or loose stools.    . metoprolol succinate (TOPROL-XL) 50 MG 24 hr tablet Take 50 mg by mouth 2 (two) times daily.    . QUEtiapine (SEROQUEL) 100 MG tablet Take 100 mg by mouth at bedtime.    . sevelamer carbonate (RENVELA) 800 MG tablet Take 4 tablets (3,200 mg total) by mouth 3 (three) times daily with meals. (Patient taking differently: Take 4,800 mg by mouth 3 (three) times daily with meals. Six tablets with each meal) 120 tablet 0  . amiodarone (PACERONE) 200 MG tablet Take 1 tablet (200 mg total) by mouth daily. (Patient not taking: Reported on 01/14/2020) 30 tablet 0   Facility-Administered Medications Ordered in Other Encounters  Medication Dose Route Frequency Provider Last Rate Last Admin  . midazolam (VERSED) 5 MG/5ML injection    Anesthesia Intra-op Sammie Bench, CRNA   2 mg at 04/11/18 1042     ROS: As per HPI otherwise negative.  Physical Exam: Vitals:   01/14/20 0927 01/14/20 0930 01/14/20 1000 01/14/20 1030  BP: (!) 160/89 (!) 156/93 115/70 130/79  Pulse: 88 89 90 94  Resp: 13 20 (!) 25 (!) 24  Temp:      TempSrc:      SpO2:         General: Ill appearing, on BiPAP  Head: NCAT sclera not icteric MMM Neck: Supple. JVD appreciated.  Lungs: Coare rhonci throughout  Heart: RRR with S1 S2 Abdomen: soft non-tender  Lower extremities:without edema or ischemic changes, no open wounds  Neuro: A & O  X 3. Moves all extremities spontaneously. Skin: Warm, dry intact Dialysis Access: LUE AVF +bruit   Labs: Basic  Metabolic Panel: Recent Labs  Lab 01/14/20 0600 01/14/20 0607 01/14/20 0753  NA 140 139 140  K 5.3* 5.1 5.5*  CL 96* 98 96*  CO2  25  --  23  GLUCOSE 147* 145* 110*  BUN 125* 127* 125*  CREATININE 14.51* 15.10* 14.62*  CALCIUM 9.6  --  9.6  PHOS  --   --  5.6*   Liver Function Tests: Recent Labs  Lab 01/14/20 0753  ALBUMIN 3.3*   No results for input(s): LIPASE, AMYLASE in the last 168 hours. No results for input(s): AMMONIA in the last 168 hours. CBC: Recent Labs  Lab 01/14/20 0600 01/14/20 0607 01/14/20 0753  WBC 5.9  --  3.1*  NEUTROABS 3.0  --   --   HGB 5.4* 6.5* 5.6*  HCT 18.4* 19.0* 18.9*  MCV 108.2*  --  109.9*  PLT 140*  --  120*   Cardiac Enzymes: No results for input(s): CKTOTAL, CKMB, CKMBINDEX, TROPONINI in the last 168 hours. CBG: No results for input(s): GLUCAP in the last 168 hours. Iron Studies: No results for input(s): IRON, TIBC, TRANSFERRIN, FERRITIN in the last 72 hours. Studies/Results: DG Chest Portable 1 View  Result Date: 01/14/2020 CLINICAL DATA:  Shortness of breath.  History of COPD and dialysis. EXAM: PORTABLE CHEST 1 VIEW COMPARISON:  05/28/2019. FINDINGS: Cardiomegaly with pulmonary venous congestion and bilateral interstitial prominence. Tiny bilateral pleural effusions. These findings are most consistent with CHF. Pneumonitis cannot be excluded. No pneumothorax. No acute bony abnormality. IMPRESSION: Cardiomegaly with pulmonary venous congestion and bilateral interstitial prominence. Tiny bilateral pleural effusions. Findings most consistent with CHF. Electronically Signed   By: Marcello Moores  Register   On: 01/14/2020 06:24    Dialysis Orders:  [facility] TTS 4h 450/800 EDW 68.5kg 2K/2Ca  UFP 4 AVF No heparin  Mircera 225 (last 6/8)  Calcitriol 1.14mcg PO TIW  Outpatient Hgb 10 on 6/24   Assessment/Plan: 1. Acute dyspnea/Pulm edema on CXR. Max volume removal on dialysis today. May require serial treatments. Monitor post HD.  Anemia likely also contributing.  2. GI Bleed. FOBT+. Hgb 5.4 on admission. Transfusing 2 u prbcs today. On Protonix gtt. Follow.  3. ESRD -  HD TTS. HD  today. Typically under dialyzed. 4. Hypertension/volume  - BP improving with UF. Post HD wt 74.4 on 7/3. Will likely need serial treatments for volume overload.  5. Anemia  - As above. Redose ESA with next HD.  6. Metabolic bone disease -  Continue Renvela binder/Calcitriol. Follow trends  7. AFib - on metoprolol/amiodarone    Lynnda Child PA-C [facility] 01/14/2020, 11:01 AM    Nephrology attending: Patient was seen and examined dialysis unit.  Chart reviewed.  I agree with the consult note as outlined above. 64 year old male ESRD on HD admitted with shortness of breath found to have severe anemia and fluid overload.  Receiving blood transfusion.  Ultrafiltration during dialysis.  Currently on BiPAP.  Tolerating well so far.  Katheran James, MD [facility].

## 2020-01-14 NOTE — H&P (Signed)
History and Physical    Raymond [name] WYO:[id number] DOB: 1955/10/17 DOA: 01/14/2020  Referring MD/NP/PA: Gean Birchwood, MD PCP: [name], FNP  Patient coming from: Home via EMS  Chief Complaint: Shortness of breath  I have personally briefly reviewed patient's old medical records in [place]   HPI: Raymond [name] is a 65 y.o. male with medical history significant of ESRD on HD(TTS), mild nonobstructive disease, CAD, PAF, PAD s/p femoral-popliteal bypass in 03/2018, COPD,  hepatitis C s/p interferon treatment, and cocaine abuse who presented with complaints of increasing shortness of breath over the last 3-4 days.  He last went to hemodialysis on Saturday, but states that his weight was unchanged when he left.  They stated that his blood pressures dropped as the reason.  Since that time he has noticed that his breathing has progressively worsened.  States that he has had wheezing and it has been hard for him to catch his breath.  Patient had been using his inhalers without any improvement.  He used a friend's nebulizer which did help some, but symptoms returned when he tried to lay down.  Reports associated symptoms of abdominal pain.  At baseline patient reports that is hard for him to get around due to claudication symptoms on the right lower extremity for which she needs to have bypass grafting done like he did on his left leg.  Reports having pain for which he admit to using cocaine approximately 2 days ago.  En route with EMS patient was noted to have a silent chest.  Given 2 g of magnesium sulfate, 0.3 mg of epinephrine, 125 mg of Solu-Medrol, and DuoNeb.  Patient thereafter noted to have breath sounds with coarse wheezing throughout.  ED Course: Upon admission to the emergency department patient was seen to be afebrile, respirations 16-25, blood pressures elevated up to 197/92, and O2 saturations currently maintained on BiPAP.  Labs were significant for hemoglobin 5.4,  potassium 5.3, BUN 125, creatinine 14.51,  glucose 147, BNP 2443.9, and troponin 32.  Chest x-ray noted cardiomegaly, pulmonary venous congestion, bilateral interstitial prominence, and small bilateral pleural effusions consistent with heart failure.  Due to the drop in hemoglobin stool guaiacs were checked and noted to be positive.  Dr. Augustin Coupe of nephrology was consulted to see the patient for urgent dialysis.  Protonix drip ordered due to frank melena found on patient's bed sheets and he was ordered to be transfused 2 units of packed red blood cells.  Review of Systems  Constitutional: Positive for malaise/fatigue. Negative for fever.  HENT: Negative for ear discharge and nosebleeds.   Eyes: Negative for double vision and photophobia.  Respiratory: Positive for shortness of breath and wheezing. Negative for cough.   Cardiovascular: Positive for chest pain and claudication.  Gastrointestinal: Positive for abdominal pain and melena.  Genitourinary: Negative for dysuria and hematuria.  Musculoskeletal: Positive for myalgias. Negative for falls.  Skin: Negative for rash.  Neurological: Negative for focal weakness and loss of consciousness.  Psychiatric/Behavioral: Positive for substance abuse. Negative for memory loss. The patient has insomnia.     Past Medical History:  Diagnosis Date  . Anemia   . CAD (coronary artery disease)    a. 03/2018: cath showing 20 to 30% stenosis along the LAD, luminal irregularities along the RCA, and angiographically normal LCx  . COPD (chronic obstructive pulmonary disease) ([place])   . Dyspnea    "with too much fluid"  . ESRD (end stage renal disease) on dialysis (  [place])    "MWF; [facility]" (07/22/17)  . Hemodialysis patient ([place])   . Hepatitis C    Still positive s/p liver biopsy at [facility]  and interferon therapy for 6 months. Most recent lab work was on 10/24/12  . Hepatitis C    "took the tx; gone now" (12/05/2016)  Was treated  . History of blood transfusion ~  2012/2013   "related to my kidneys; blood was low"  . Hypertension   . Substance abuse ([place])   . Thyroid disease     Past Surgical History:  Procedure Laterality Date  . ABDOMINAL AORTOGRAM W/LOWER EXTREMITY N/A 02/08/2018   Procedure: ABDOMINAL AORTOGRAM W/LOWER EXTREMITY;  Surgeon: Conrad [place], MD;  Location: [facility];  Service: Cardiovascular;  Laterality: N/A;  . AV FISTULA PLACEMENT Left Aug. 2013 ?  . COLONOSCOPY    . COLONOSCOPY WITH PROPOFOL N/A 08/21/2018   Procedure: COLONOSCOPY WITH PROPOFOL;  Surgeon: Yetta Flock, MD;  Location: [facility];  Service: Gastroenterology;  Laterality: N/A;  . ESOPHAGOGASTRODUODENOSCOPY (EGD) WITH PROPOFOL N/A 08/20/2018   Procedure: ESOPHAGOGASTRODUODENOSCOPY (EGD) WITH PROPOFOL;  Surgeon: Gatha Mayer, MD;  Location: [facility];  Service: Endoscopy;  Laterality: N/A;  . FEMORAL-POPLITEAL BYPASS GRAFT Left 04/11/2018   Procedure: BYPASS GRAFT FEMORAL-POPLITEAL ARTERY LEFT LEG;  Surgeon: Rosetta Posner, MD;  Location: [facility];  Service: Vascular;  Laterality: Left;  . HOT HEMOSTASIS N/A 08/20/2018   Procedure: HOT HEMOSTASIS (ARGON PLASMA COAGULATION/BICAP);  Surgeon: Gatha Mayer, MD;  Location: [facility] ENDOSCOPY;  Service: Endoscopy;  Laterality: N/A;  . LEFT HEART CATH AND CORONARY ANGIOGRAPHY N/A 03/29/2018   Procedure: LEFT HEART CATH AND CORONARY ANGIOGRAPHY;  Surgeon: Nelva Bush, MD;  Location: [facility];  Service: Cardiovascular;  Laterality: N/A;  . LIVER BIOPSY    . ORIF ULNAR FRACTURE Left 05/18/2017   Procedure: OPEN REDUCTION INTERNAL FIXATION (ORIF) ULNAR FRACTURE;  Surgeon: Iran Planas, MD;  Location: [facility];  Service: Orthopedics;  Laterality: Left;  . POLYPECTOMY  08/21/2018   Procedure: POLYPECTOMY;  Surgeon: Yetta Flock, MD;  Location: [facility] ENDOSCOPY;  Service: Gastroenterology;;  . SHUNTOGRAM N/A 12/11/2012   Procedure: Earney Mallet;  Surgeon: Serafina Mitchell, MD;  Location: [facility] CATH LAB;   Service: Cardiovascular;  Laterality: N/A;     reports that he has been smoking cigarettes. He has a 25.00 pack-year smoking history. He has never used smokeless tobacco. He reports current alcohol use. He reports previous drug use. Drug: Cocaine.  No Known Allergies  Family History  Problem Relation Age of Onset  . Diabetes Father   . Hypertension Father   . Heart disease Father     Prior to Admission medications   Medication Sig Start Date End Date Taking? Authorizing Provider  albuterol (VENTOLIN HFA) 108 (90 Base) MCG/ACT inhaler Inhale 1-2 puffs into the lungs every 4 (four) hours as needed for shortness of breath. 11/06/19  Yes [provider]  amLODipine (NORVASC) 10 MG tablet Take 10 mg by mouth daily. Take with 2.5mg  for a total dose of 12.5mg . 12/25/19  Yes [provider]  amLODipine (NORVASC) 2.5 MG tablet Take 2.5 mg by mouth daily. Take with 10mg  for a total dose of 12.5mg . 01/01/20  Yes [provider]  bismuth subsalicylate (PEPTO BISMOL) 262 MG chewable tablet Chew 524 mg by mouth as needed for indigestion or diarrhea or loose stools.   Yes [provider]  metoprolol succinate (TOPROL-XL) 50 MG 24 hr tablet Take  50 mg by mouth 2 (two) times daily. 01/02/20  Yes [provider]  QUEtiapine (SEROQUEL) 100 MG tablet Take 100 mg by mouth at bedtime. 01/01/20  Yes [provider]  sevelamer carbonate (RENVELA) 800 MG tablet Take 4 tablets (3,200 mg total) by mouth 3 (three) times daily with meals. Patient taking differently: Take 4,800 mg by mouth 3 (three) times daily with meals. Six tablets with each meal 09/14/18  Yes Vasireddy, Grier Mitts, MD  amiodarone (PACERONE) 200 MG tablet Take 1 tablet (200 mg total) by mouth daily. Patient not taking: Reported on 01/14/2020 09/14/18   Monica Becton, MD    Physical Exam:  Constitutional: Elderly male who appears older than stated age currently in no acute distress Vitals:   01/14/20  0630 01/14/20 0645 01/14/20 0700 01/14/20 0734  BP: (!) 189/96 (!) 178/87 (!) 178/92 (!) 177/87  Pulse:    92  Resp: (!) 22 16 20 19   Temp:      TempSrc:      SpO2:    100%   Eyes: PERRL, arcus sensill appreciated ENMT: Mucous membranes are dry. Posterior pharynx clear of any exudate or lesions.  Poor dentition.  Neck: normal, supple, no masses, no thyromegaly Respiratory: Patient currently on 2 L of nasal cannula oxygen with crackles appreciated.  Able to talk in entences following hemodialysis. Cardiovascular: Tachycardic, no murmurs / rubs / gallops. No extremity edema.. No carotid bruits.  Fistula present at the left upper remedy. Abdomen: no tenderness, no masses palpated. No hepatosplenomegaly. Bowel sounds positive.  Musculoskeletal: no clubbing / cyanosis. No joint deformity upper and lower extremities. Good ROM, no contractures. Normal muscle tone.  Skin: no rashes, lesions, ulcers. No induration Neurologic: CN 2-12 grossly intact. Sensation intact, DTR normal. Strength 5/5 in all 4.  Psychiatric: Normal judgment and insight. Alert and oriented x 3. Normal mood.     Labs on Admission: I have personally reviewed following labs and imaging studies  CBC: Recent Labs  Lab 01/14/20 0600 01/14/20 0607  WBC 5.9  --   NEUTROABS 3.0  --   HGB 5.4* 6.5*  HCT 18.4* 19.0*  MCV 108.2*  --   PLT 140*  --    Basic Metabolic Panel: Recent Labs  Lab 01/14/20 0600 01/14/20 0607  NA 140 139  K 5.3* 5.1  CL 96* 98  CO2 25  --   GLUCOSE 147* 145*  BUN 125* 127*  CREATININE 14.51* 15.10*  CALCIUM 9.6  --    GFR: CrCl cannot be calculated (Unknown ideal weight.). Liver Function Tests: No results for input(s): AST, ALT, ALKPHOS, BILITOT, PROT, ALBUMIN in the last 168 hours. No results for input(s): LIPASE, AMYLASE in the last 168 hours. No results for input(s): AMMONIA in the last 168 hours. Coagulation Profile: No results for input(s): INR, PROTIME in the last 168  hours. Cardiac Enzymes: No results for input(s): CKTOTAL, CKMB, CKMBINDEX, TROPONINI in the last 168 hours. BNP (last 3 results) No results for input(s): PROBNP in the last 8760 hours. HbA1C: No results for input(s): HGBA1C in the last 72 hours. CBG: No results for input(s): GLUCAP in the last 168 hours. Lipid Profile: No results for input(s): CHOL, HDL, LDLCALC, TRIG, CHOLHDL, LDLDIRECT in the last 72 hours. Thyroid Function Tests: No results for input(s): TSH, T4TOTAL, FREET4, T3FREE, THYROIDAB in the last 72 hours. Anemia Panel: No results for input(s): VITAMINB12, FOLATE, FERRITIN, TIBC, IRON, RETICCTPCT in the last 72 hours. Urine analysis:    Component Value Date/Time  COLORURINE YELLOW 05/08/2010 [place] 05/08/2010 1335   LABSPEC 1.009 05/08/2010 1335   PHURINE 6.0 05/08/2010 1335   GLUCOSEU NEGATIVE 05/08/2010 1335   HGBUR NEGATIVE 05/08/2010 1335   BILIRUBINUR NEGATIVE 05/08/2010 1335   KETONESUR NEGATIVE 05/08/2010 1335   PROTEINUR 100 (A) 05/08/2010 1335   UROBILINOGEN 0.2 05/08/2010 1335   NITRITE NEGATIVE 05/08/2010 1335   LEUKOCYTESUR SMALL (A) 05/08/2010 1335   Sepsis Labs: Recent Results (from the past 240 hour(s))  SARS Coronavirus 2 by RT PCR (hospital order, performed in [place] hospital lab) Nasopharyngeal Nasopharyngeal Swab     Status: None   Collection Time: 01/14/20  5:58 AM   Specimen: Nasopharyngeal Swab  Result Value Ref Range Status   SARS Coronavirus 2 NEGATIVE NEGATIVE Final    Comment: (NOTE) SARS-CoV-2 target nucleic acids are NOT DETECTED.  The SARS-CoV-2 RNA is generally detectable in upper and lower respiratory specimens during the acute phase of infection. The lowest concentration of SARS-CoV-2 viral copies this assay can detect is 250 copies / mL. A negative result does not preclude SARS-CoV-2 infection and should not be used as the sole basis for treatment or other patient management decisions.  A negative  result may occur with improper specimen collection / handling, submission of specimen other than nasopharyngeal swab, presence of viral mutation(s) within the areas targeted by this assay, and inadequate number of viral copies (<250 copies / mL). A negative result must be combined with clinical observations, patient history, and epidemiological information.  Fact Sheet for Patients:   StrictlyIdeas.no  Fact Sheet for Healthcare Providers: BankingDealers.co.za  This test is not yet approved or  cleared by the [place] FDA and has been authorized for detection and/or diagnosis of SARS-CoV-2 by FDA under an Emergency Use Authorization (EUA).  This EUA will remain in effect (meaning this test can be used) for the duration of the COVID-19 declaration under Section 564(b)(1) of the Act, 21 U.S.C. section 360bbb-3(b)(1), unless the authorization is terminated or revoked sooner.  Performed at [facility], [place] [address], [place], [place] [address]      Radiological Exams on Admission: DG Chest Portable 1 View  Result Date: 01/14/2020 CLINICAL DATA:  Shortness of breath.  History of COPD and dialysis. EXAM: PORTABLE CHEST 1 VIEW COMPARISON:  05/28/2019. FINDINGS: Cardiomegaly with pulmonary venous congestion and bilateral interstitial prominence. Tiny bilateral pleural effusions. These findings are most consistent with CHF. Pneumonitis cannot be excluded. No pneumothorax. No acute bony abnormality. IMPRESSION: Cardiomegaly with pulmonary venous congestion and bilateral interstitial prominence. Tiny bilateral pleural effusions. Findings most consistent with CHF. Electronically Signed   By: Marcello Moores  Register   On: 01/14/2020 06:24    EKG: Independently reviewed.  Sinus tachycardia at 103 bpm  Assessment/Plan  Acute respiratory failure with hypoxia Possible COPD exacerbation: Patient was noted to be in significant respiratory distress  initially given 0.3 mg of epinephrine, 125 mg of Solu-Medrol, 2 g of magnesium sulfate, and breathing treatments.  Only he is not on any oxygen at home.  Required placement to BiPAP in the emergency department due to his respiratory distress.  Patient with noted crackles on physical exam without significant wheezing imaging shows signs of fluid overload as seen below. -Admit to a progressive bed -Continuous pulse oximetry -Continue BiPAP and wean to room air as able. -N.p.o. while on BiPAP  -DuoNebs 4 times daily  Acute pulmonary edema  ESRD on HD: Patient presented with complaints of shortness of breath.  Normally  dialyzes Tuesday, Thursday, and Saturday.  Labs are significant for potassium 2.3, BUN 125, creatinine 14.51, BNP of 2443.9.  Chest x-ray noted cardiomegaly with signs of fluid overload.  Nephrology has been formally consulted and plan to perform urgent dialysis. -Appreciate nephrology consultative services. Will follow-up for further recommendation -HD per nephrology  GI bleed Anemia with acute blood loss History of AVM: Acute on chronic.  On admission hemoglobin down to 5.4 with melena reported.  Patient typed and screened and ordered to be transfused 2 units of packed red blood cells.  Suspect likely an upper GI bleed.  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 -Continue with transfusion of 2 units of packed red blood cells -Protonix drip -GI formally consulted, we will follow-up for further recommendations  Hypertensive urgency: On admission blood pressures noted to be elevated up to 197/92.  Home blood pressure medications include amlodipine 12.5 mg daily and metoprolol succinate 50 mg twice daily. -Continue home blood pressure medications when able  -Hydralazine IV as needed  Elevated troponin: Acute.  Initial troponin elevated at 32.  Suspect secondary to demand. -Continue to monitor cardiac troponins  Hyperkalemia: Acute.  Initial potassium  5.3. -Continue to monitor  History of paroxysmal atrial fibrillation: Patient CHA2DS2-VASc score = 3, but had not been on anticoagulation by choice.  He also has history of GI bleeds which makes it less likely for him to anticoagulation.  Peripheral vascular disease: Patient with previous femoral popliteal bypass of the left leg in 2019.  Patient reports having severe claudication type pain in the right lower extremity. -Continue outpatient follow-up with vascular surgery  Thrombocytopenia: Chronic.  Platelet count 140.  Patient was found to have signs of bleeding. -Continue to monitor platelet counts  Polysubstance abuse: Patient with history of tobacco, alcohol, and cocaine use.  He admits to recently using cocaine in the last 2 days and continues to smoke cigarettes.   -Check alcohol level and serum drug screen -Nicotine patch offered -Counseled on need of cessation of cocaine and tobacco use -Transitions of care consult for cocaine abuse treatment options  DVT prophylaxis: SCDs Code Status: Full Family Communication: Attempted to call patient's sister over the phone twice without any answer and no way to leave voicemail Disposition Plan: To be determined Consults called: Nephrology, gastroenterology Admission status: Inpatient  Norval Morton MD [facility] Pager [phone]   If 7PM-7AM, please contact night-coverage [email or web address] Password [facility]  01/14/2020, 7:37 AM

## 2020-01-15 ENCOUNTER — Inpatient Hospital Stay (HOSPITAL_COMMUNITY): Payer: Medicare Other

## 2020-01-15 DIAGNOSIS — J9601 Acute respiratory failure with hypoxia: Secondary | ICD-10-CM

## 2020-01-15 DIAGNOSIS — K921 Melena: Secondary | ICD-10-CM

## 2020-01-15 DIAGNOSIS — Z992 Dependence on renal dialysis: Secondary | ICD-10-CM

## 2020-01-15 DIAGNOSIS — N186 End stage renal disease: Secondary | ICD-10-CM

## 2020-01-15 DIAGNOSIS — J810 Acute pulmonary edema: Secondary | ICD-10-CM

## 2020-01-15 DIAGNOSIS — D62 Acute posthemorrhagic anemia: Secondary | ICD-10-CM

## 2020-01-15 LAB — FOLATE: Folate: 8.7 ng/mL (ref >5.9)

## 2020-01-15 LAB — BASIC METABOLIC PANEL
Anion gap: 11 (ref 5–15)
BUN: 46 mg/dL — ABNORMAL HIGH (ref 8–23)
CO2: 28 mmol/L (ref 22–32)
Calcium: 8.8 mg/dL — ABNORMAL LOW (ref 8.9–10.3)
Chloride: 97 mmol/L — ABNORMAL LOW (ref 98–111)
Creatinine, Ser: 7.49 mg/dL — ABNORMAL HIGH (ref 0.61–1.24)
GFR calc Af Amer: 8 mL/min — ABNORMAL LOW (ref >60)
GFR calc non Af Amer: 7 mL/min — ABNORMAL LOW (ref >60)
Glucose, Bld: 85 mg/dL (ref 70–99)
Potassium: 5.2 mmol/L — ABNORMAL HIGH (ref 3.5–5.1)
Sodium: 136 mmol/L (ref 135–145)

## 2020-01-15 LAB — CBC
HCT: 21 % — ABNORMAL LOW (ref 39.0–52.0)
HCT: 26.5 % — ABNORMAL LOW (ref 39.0–52.0)
Hemoglobin: 6.5 g/dL — CL (ref 13.0–17.0)
Hemoglobin: 8.4 g/dL — ABNORMAL LOW (ref 13.0–17.0)
MCH: 30.2 pg (ref 26.0–34.0)
MCH: 30.4 pg (ref 26.0–34.0)
MCHC: 31 g/dL (ref 30.0–36.0)
MCHC: 31.7 g/dL (ref 30.0–36.0)
MCV: 97.7 fL (ref 80.0–100.0)
MCV: 96 fL (ref 80.0–100.0)
Platelets: 123 10*3/uL — ABNORMAL LOW (ref 150–400)
Platelets: 129 10*3/uL — ABNORMAL LOW (ref 150–400)
RBC: 2.15 MIL/uL — ABNORMAL LOW (ref 4.22–5.81)
RBC: 2.76 MIL/uL — ABNORMAL LOW (ref 4.22–5.81)
RDW: 21.5 % — ABNORMAL HIGH (ref 11.5–15.5)
RDW: 20.6 % — ABNORMAL HIGH (ref 11.5–15.5)
WBC: 1.6 10*3/uL — ABNORMAL LOW (ref 4.0–10.5)
WBC: 2.9 10*3/uL — ABNORMAL LOW (ref 4.0–10.5)
nRBC: 0 % (ref 0.0–0.2)
nRBC: 0 % (ref 0.0–0.2)

## 2020-01-15 LAB — VITAMIN B12: Vitamin B-12: 608 pg/mL (ref 180–914)

## 2020-01-15 LAB — HEPATIC FUNCTION PANEL
ALT: 13 U/L (ref 0–44)
AST: 12 U/L — ABNORMAL LOW (ref 15–41)
Albumin: 2.9 g/dL — ABNORMAL LOW (ref 3.5–5.0)
Alkaline Phosphatase: 45 U/L (ref 38–126)
Bilirubin, Direct: 0.1 mg/dL (ref 0.0–0.2)
Indirect Bilirubin: 0.6 mg/dL (ref 0.3–0.9)
Total Bilirubin: 0.7 mg/dL (ref 0.3–1.2)
Total Protein: 6.2 g/dL — ABNORMAL LOW (ref 6.5–8.1)

## 2020-01-15 LAB — PREPARE RBC (CROSSMATCH)

## 2020-01-15 LAB — IRON AND TIBC
Iron: 123 ug/dL (ref 45–182)
Saturation Ratios: 39 % (ref 17.9–39.5)
TIBC: 316 ug/dL (ref 250–450)
UIBC: 193 ug/dL

## 2020-01-15 LAB — HEMOGLOBIN AND HEMATOCRIT, BLOOD
HCT: 25 % — ABNORMAL LOW (ref 39.0–52.0)
Hemoglobin: 8 g/dL — ABNORMAL LOW (ref 13.0–17.0)

## 2020-01-15 LAB — HEPATITIS B SURFACE ANTIBODY, QUANTITATIVE: Hep B S AB Quant (Post): 13.7 m[IU]/mL (ref >9.9)

## 2020-01-15 LAB — FERRITIN: Ferritin: 160 ng/mL (ref 24–336)

## 2020-01-15 IMAGING — CT CT CTA ABD/PEL W/CM AND/OR W/O CM
3 of 10 series · 10 of 46 positions shown, 16 images · IV contrast (Omni 300)
Comparison: CT abdomen pelvis dated [DATE].

CLINICAL DATA: 64-year-old male with GI bleed.

EXAM:
CTA ABDOMEN AND PELVIS WITHOUT AND WITH CONTRAST
TECHNIQUE: Multidetector CT imaging of the abdomen and pelvis was performed
using the standard protocol during bolus administration of
intravenous contrast. Multiplanar reconstructed images and MIPs were
obtained and reviewed to evaluate the vascular anatomy.
CONTRAST:  100mL OMNIPAQUE IOHEXOL 350 MG/ML SOLN

[Series 6: arterial 3.0 · axial · arterial · 0.76mm/px · z∈[+865,+937]mm · 2 of 143 slices shown]
[im 12/143  soft-tissue]
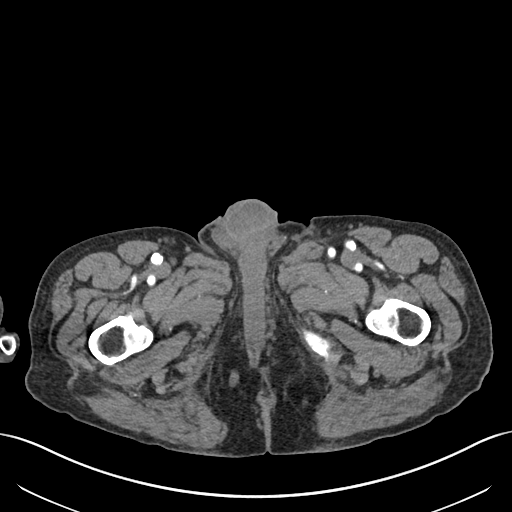
[im 36/143  soft-tissue]
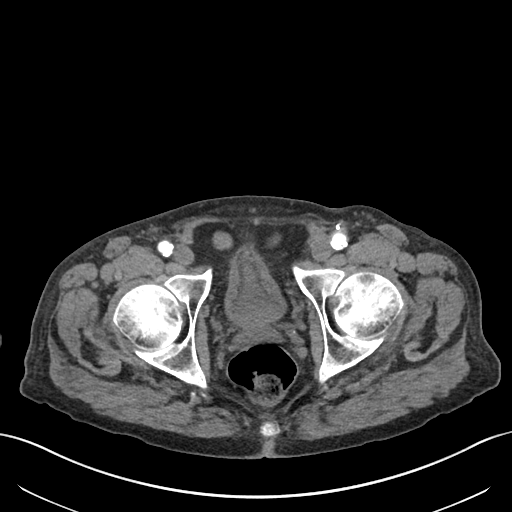

[Series 8: arterial cor · coronal · arterial · 0.64mm/px · 2 of 145 slices shown, 3 images]
[im 49/145  soft-tissue]
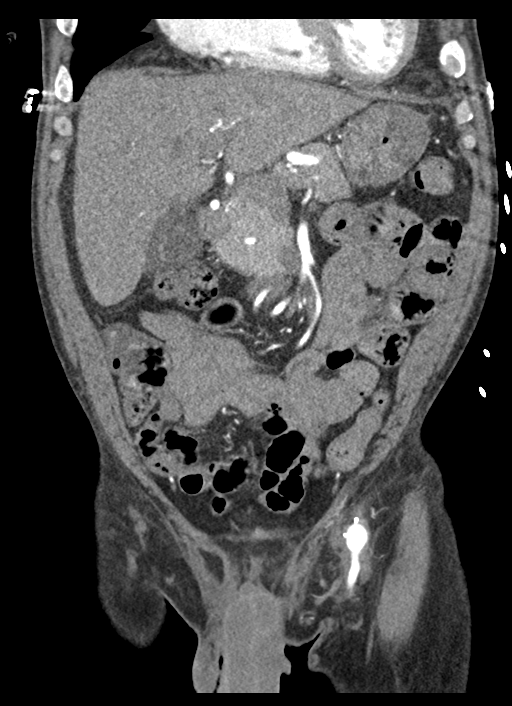
[im 49/145  bone]
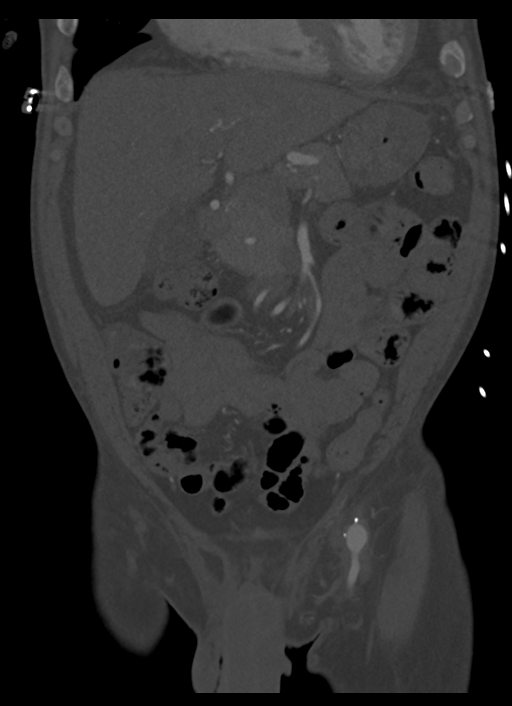
[im 97/145  soft-tissue]
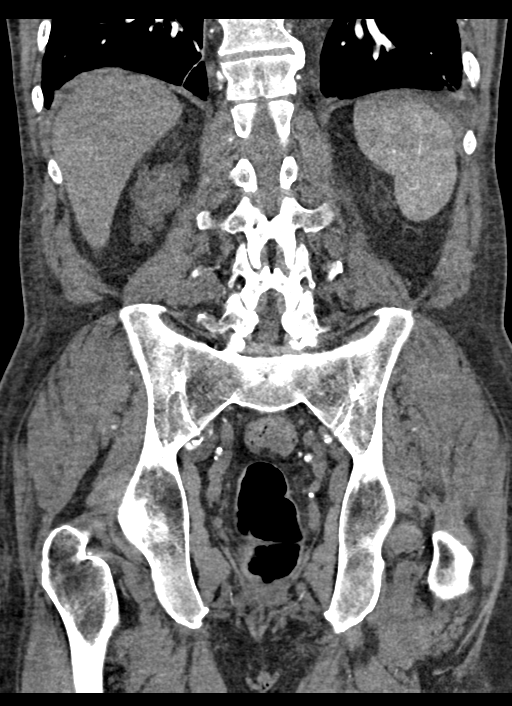

[Series 12: portal venous · axial · portal-venous · 0.76mm/px · z∈[+893,+1193]mm · 6 of 86 slices shown, 11 images]
[im 13/86  soft-tissue]
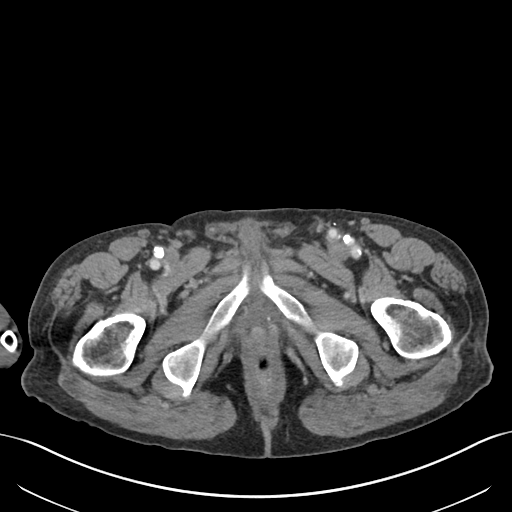
[im 13/86  bone]
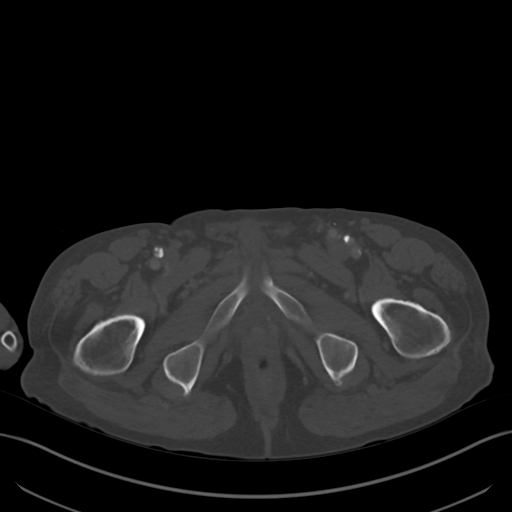
[im 25/86  soft-tissue]
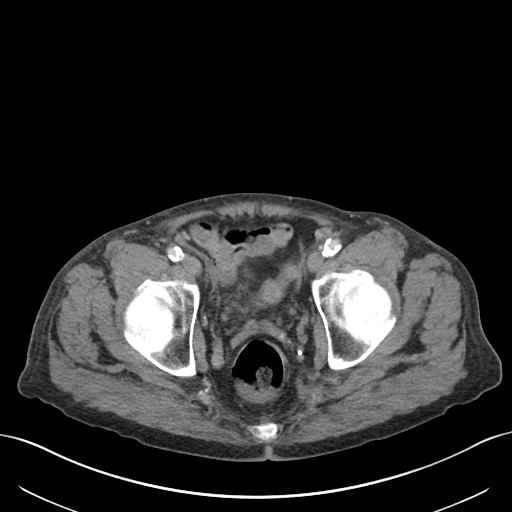
[im 37/86  soft-tissue]
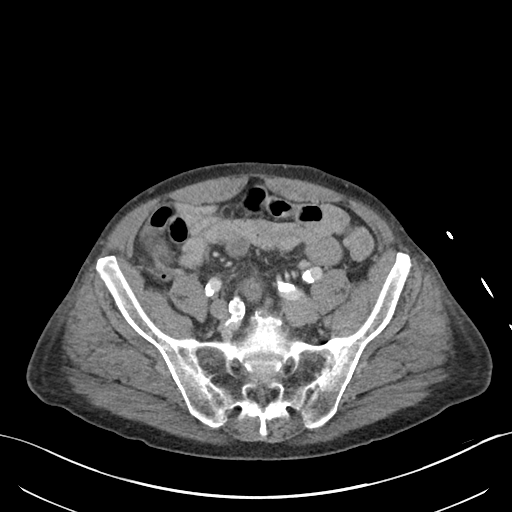
[im 37/86  lung]
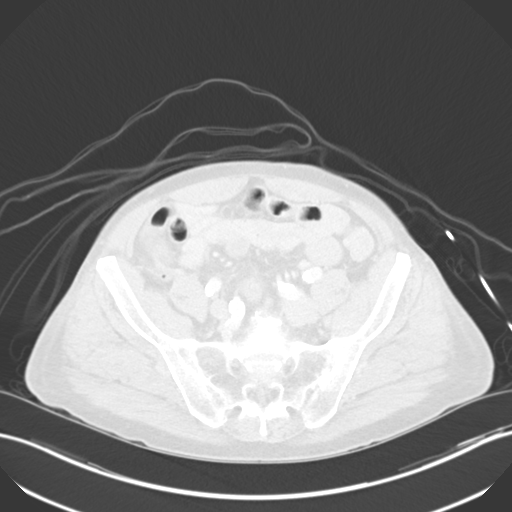
[im 49/86  soft-tissue]
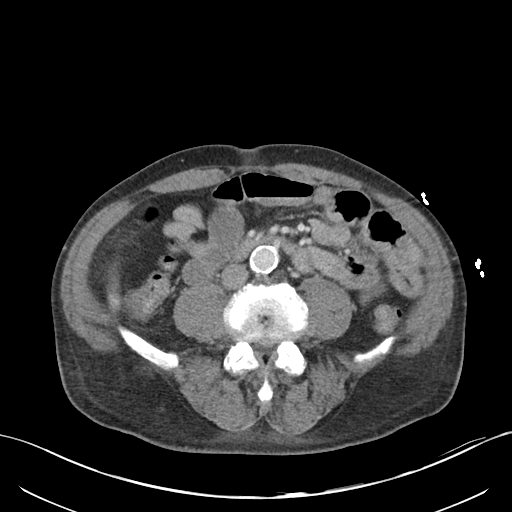
[im 49/86  lung]
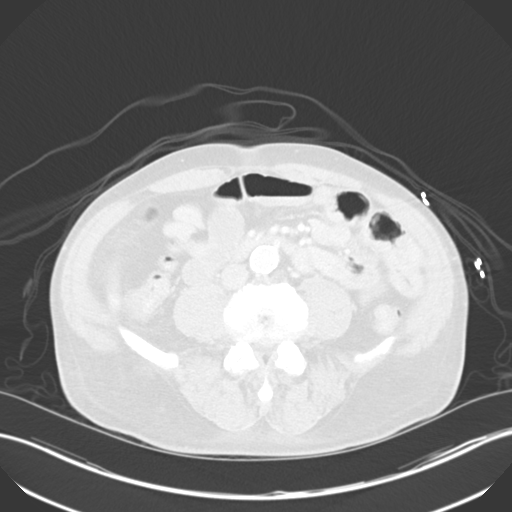
[im 61/86  soft-tissue]
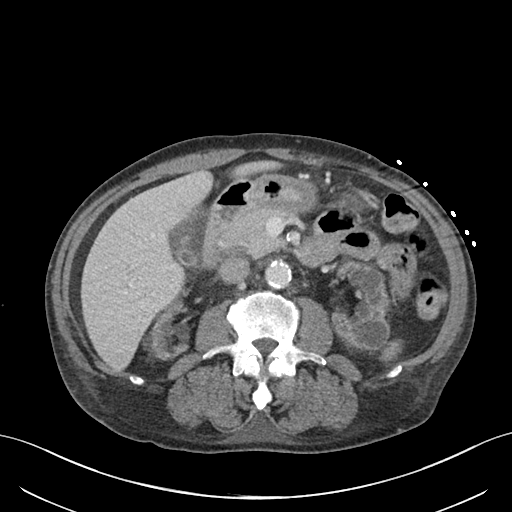
[im 61/86  lung]
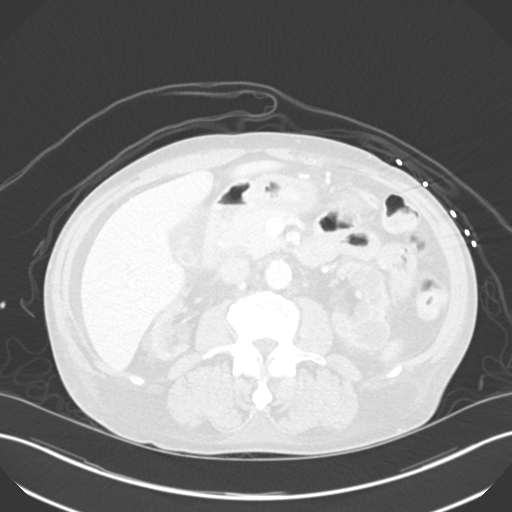
[im 73/86  soft-tissue]
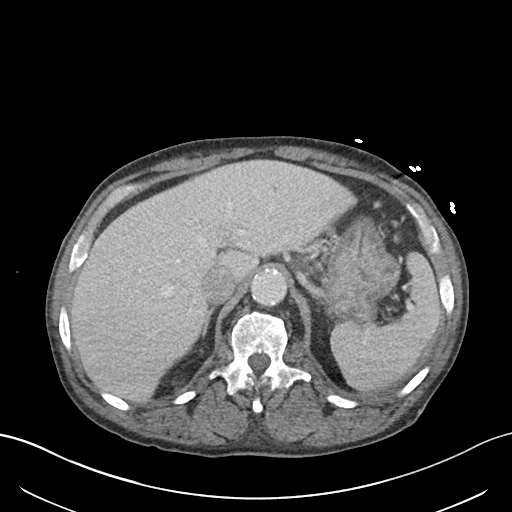
[im 73/86  lung]
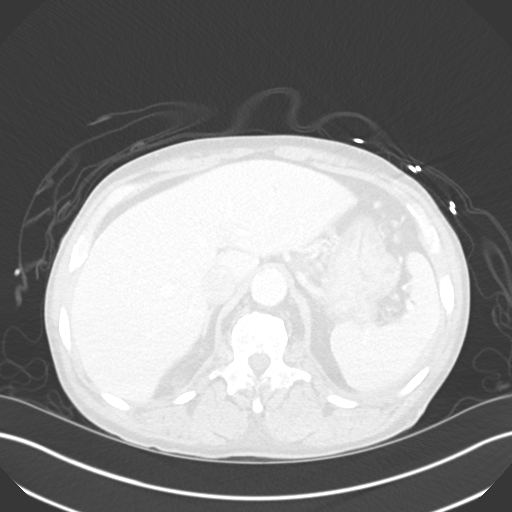

[10 of 46 positions shown; findings below may reference images not displayed]

FINDINGS: VASCULAR

Aorta: Advanced atherosclerotic calcification of the abdominal
aorta. There is a 2.7 cm infrarenal aortic ectasia. No aneurysmal
dilatation or dissection. No periaortic fluid.

Celiac: Mild focal narrowing of the origin of the celiac artery. The
celiac artery and its major branches are patent.

SMA: There is atherosclerotic calcification of the origin of the
SMA. The SMA is patent. There is replaced hepatic artery arising
from the SMA.

Renals: Atherosclerotic calcification of the renal artery ostia.
There is diminished perfusion within the renal arteries.

IMA: Patent without evidence of aneurysm, dissection, vasculitis or
significant stenosis.

Inflow: There is atherosclerotic calcification of the iliac
arteries. The iliac arteries remain patent.

Proximal Outflow: Advanced atherosclerotic calcification of the
visualized femoral arteries. There is complete occlusion of the
visualized proximal portion of the superficial femoral arteries.

Veins: The SMV, splenic vein, and main portal vein are patent. The
IVC is unremarkable. No portal venous gas.

Review of the MIP images confirms the above findings.

NON-VASCULAR

Lower chest: Partially visualized small bilateral pleural effusion
and bibasilar subsegmental atelectasis. Pneumonia is not excluded.
Clinical correlation is recommended. There is cardiomegaly and
coronary vascular calcification.

No intra-abdominal free air or free fluid.

Hepatobiliary: The liver is unremarkable. There is mild intrahepatic
biliary ductal dilatation versus mild periportal edema. Correlation
with LFTs recommended. There is diffuse thickened appearance of the
gallbladder wall with possible small pericholecystic fluid. Findings
may be related to underlying liver disease although acute
cholecystitis is not excluded. Correlation with clinical exam and
further evaluation with ultrasound recommended. No calcified
gallstone.

Pancreas: Unremarkable. No pancreatic ductal dilatation or
surrounding inflammatory changes.

Spleen: Normal in size without focal abnormality.

Adrenals/Urinary Tract: The adrenal glands are unremarkable.
Moderate bilateral renal parenchyma atrophy indeterminate bilateral
hypodense lesions. The larger lesions demonstrate fluid attenuation
consistent with cysts. The smaller lesions are too small to
characterize. There is no hydronephrosis on either side. The
visualized ureters appear unremarkable. The urinary bladder is
collapsed.

Stomach/Bowel: Several small scattered sigmoid diverticula without
active inflammatory changes. There is no bowel obstruction or active
inflammation. Normal appendix. No evidence of active GI bleed.

Lymphatic: No adenopathy.

Reproductive: The prostate and seminal vesicles are grossly
unremarkable.

Other: Small fat containing umbilical hernia.

Musculoskeletal: Osteopenia with degenerative changes of the spine.
No acute osseous pathology.
IMPRESSION: 1. No CT evidence of active GI bleed.
2. Thickened appearance of the gallbladder wall with possible small
pericholecystic fluid. Findings may be related to underlying liver
disease although acute cholecystitis is not excluded. Correlation
with clinical exam and further evaluation with ultrasound
recommended.
3. Small scattered sigmoid diverticula. No bowel obstruction. Normal
appendix.
4. Cardiomegaly with coronary vascular calcification.
5. Partially visualized small bilateral pleural effusions and
bibasilar subsegmental atelectasis. Pneumonia is not excluded.
Clinical correlation is recommended.
6. Aortic Atherosclerosis ([WZ]-[WZ]).

## 2020-01-15 MED ORDER — IOHEXOL 350 MG/ML SOLN
100.0000 mL | Freq: Once | INTRAVENOUS | Status: AC | PRN
  Administered 2020-01-15: 100 mL via INTRAVENOUS

## 2020-01-15 MED ORDER — IPRATROPIUM-ALBUTEROL 0.5-2.5 (3) MG/3ML IN SOLN: 3.0000 mL | RESPIRATORY_TRACT | Status: DC | PRN

## 2020-01-15 MED ORDER — METOPROLOL TARTRATE 5 MG/5ML IV SOLN
5.0000 mg | INTRAVENOUS | Status: DC | PRN
  Administered 2020-01-15 (×2): 5 mg via INTRAVENOUS
  Filled 2020-01-15 (×2): qty 5

## 2020-01-15 MED ORDER — DARBEPOETIN ALFA 200 MCG/0.4ML IJ SOSY: 200.0000 ug | PREFILLED_SYRINGE | INTRAMUSCULAR | Status: DC

## 2020-01-15 NOTE — Progress Notes (Addendum)
[facility] Progress Note   Subjective:  Completed dialysis yesterday 4L UF tolerated. Hgb dropped 6.5, getting another unit of blood this am. SOB improved from yesterday on 2L [place].   Objective Vitals:   01/15/20 0734 01/15/20 0745 01/15/20 0746 01/15/20 0900  BP:  (!) 141/96 138/87 111/69  Pulse:    85  Resp:  18 18 18   Temp:  97.6 F (36.4 C)  98 F (36.7 C)  TempSrc:  Oral  Oral  SpO2: 100% 100%  99%    Weight change:      Additional Objective Labs: Basic Metabolic Panel: Recent Labs  Lab 01/14/20 0600 01/14/20 0600 01/14/20 0607 01/14/20 0753 01/15/20 0317  NA 140   < > 139 140 136  K 5.3*   < > 5.1 5.5* 5.2*  CL 96*   < > 98 96* 97*  CO2 25  --   --  23 28  GLUCOSE 147*   < > 145* 110* 85  BUN 125*   < > 127* 125* 46*  CREATININE 14.51*   < > 15.10* 14.62* 7.49*  CALCIUM 9.6  --   --  9.6 8.8*  PHOS  --   --   --  5.6*  --    < > = values in this interval not displayed.   CBC: Recent Labs  Lab 01/14/20 0600 01/14/20 0607 01/14/20 0753 01/14/20 1644 01/15/20 0317  WBC 5.9  --  3.1*  --  1.6*  NEUTROABS 3.0  --   --   --   --   HGB 5.4*   < > 5.6* 7.0* 6.5*  HCT 18.4*   < > 18.9* 22.4* 21.0*  MCV 108.2*  --  109.9*  --  97.7  PLT 140*  --  120*  --  123*   < > = values in this interval not displayed.   Blood Culture    Component Value Date/Time   SDES BLOOD RIGHT HAND 12/24/2018 1912   SPECREQUEST  12/24/2018 1912    BOTTLES DRAWN AEROBIC ONLY Blood Culture results may not be optimal due to an inadequate volume of blood received in culture bottles   CULT  12/24/2018 1912    NO GROWTH 5 DAYS Performed at [facility], [place] [address], [place], [place] [address]    REPTSTATUS 12/29/2018 FINAL 12/24/2018 1912     Physical Exam General: Lying in bed on nasal oxygen  Heart: RRR Lungs: +rales at bases  Abdomen: soft non-tender  Extremities: no sig LE edema  Dialysis Access: LUE AVF +bruit   Medications: . sodium  chloride    . sodium chloride    . pantoprozole (PROTONIX) infusion 8 mg/hr (01/15/20 0128)   . amLODipine  12.5 mg Oral Daily  . Chlorhexidine Gluconate Cloth  6 each Topical Q0600  . metoprolol succinate  50 mg Oral BID  . nicotine  21 mg Transdermal Daily  . [START ON 01/17/2020] pantoprazole  40 mg Intravenous Q12H  . QUEtiapine  100 mg Oral QHS  . sevelamer carbonate  4,800 mg Oral TID WC  . sodium chloride flush  3 mL Intravenous Q12H    Dialysis Orders:  [facility] TTS 4h 450/800 EDW 68.5kg 2K/2Ca  UFP 4 AVF No heparin  Mircera 225 (last 6/8)  Calcitriol 1.20mcg PO TIW  Outpatient Hgb 10 on 6/24   Assessment/Plan: 1. Acute dyspnea/Pulm edema on CXR. Improving with volume removal on dialysis. May require serial treatments. Monitor post HD.  Anemia  likely also contributing.  2. GI Bleed/ABLA . FOBT+. Hgb 5.4 on admission. Transfused  2 u prbcs 7/6 Hgb 7.0>6.5.  Another unit today.  On Protonix gtt. GI following -possible enteroscopy 3. ESRD -  HD TTS. K 5.2. Next HD 7/8.  4. Hypertension/volume  - BP improving with UF. Post HD wt 74.4 on 7/3. UF 4L with next HD. Get standing weights.  5. Anemia  - As above. Tsat 39%. Redose ESA with next HD.  6. Metabolic bone disease -  Continue Renvela binder/Calcitriol. Follow trends  7. AFib - on metoprolol. No anticoag 2/2 GI bleed.    Lynnda Child PA-C [facility] 01/15/2020,9:19 AM  Nephrology attending: Patient was seen and examined.  Chart reviewed.  I agree with assessment plan as outlined above.  Shortness of breath has significantly improved after 2 unit of blood transfusion.  He had dialysis yesterday with 4 L UF.  GI evaluation ongoing for drop in hemoglobin.  Receiving another unit of blood transfusion.  Plan for next HD tomorrow.  Katheran James, MD [facility].

## 2020-01-15 NOTE — Progress Notes (Signed)
PROGRESS NOTE        PATIENT DETAILS Name: Raymond [name] Age: 64 y.o. Sex: male Date of Birth: 1955/08/07 Admit Date: 01/14/2020 Admitting Physician Norval Morton, MD KTG:YBWLS, Malva Limes., FNP  Brief Narrative: Patient is a 64 y.o. male with history of ESRD on HD TTS, CAD, PAF not on anticoagulation, PAD s/p femoropopliteal bypass in 2019, COPD, hepatitis C s/p interferon treatment, ongoing cocaine abuse, history of intestinal AVMs-who presented with shortness of breath-found to have acute hypoxic respiratory failure due to pulmonary edema requiring BiPAP support and severe acute blood loss anemia due to upper GI bleeding.  Significant events: 7/6>> admit to [facility]-severe hypoxia from pulmonary edema-and acute blood loss anemia from GI bleeding.  Significant studies: 7/6>> chest x-ray: Findings consistent with CHF. 04/26/2018>> echo: EF 60-65%  Antimicrobial therapy: None  Microbiology data: None  Procedures : None  Consults: GI, nephrology  DVT Prophylaxis : SCDs Start: 01/14/20 0802   Subjective: No longer on BiPAP-on 2 L of oxygen-claims breathing is better.  No chest pain.  Per nursing staff-1 additional episode of melena overnight.  Per patient-has had dark-colored stool for almost 2 weeks.  Assessment/Plan: Acute hypoxic respiratory failure secondary to pulmonary edema in the setting of ESRD: Required BiPAP on initial presentation-improved after HD.  Currently on 2 L of oxygen-titrate off oxygen as tolerated.  GI bleeding (history of small bowel AVMs) with acute blood loss anemia: Hemoglobin 5.4 on admission-given 2 units of PRBC-hemoglobin at 6.5 this morning-Per nursing staff-1 more l episode of melena overnight.  Being transfused 1 more unit of PRBC this morning.  Follow posttransfusion CBC.  Remains on PPI infusion-GI following-with plans for endoscopy evaluation.  ESRD: On HD TTS-nephrology following and directing care  Hyperkalemia:  Resolved post HD  PAF: In A. fib-this morning-rate controlled-not a candidate for anticoagulation at this point given ongoing GI bleeding.  PAD s/p left femoropopliteal bypass 2019: Remains stable-outpatient follow-up with vascular surgery.  HTN: Controlled-continue amlodipine/metoprolol-may need to use a more selective beta-blocker given history of cocaine use.  Thrombocytopenia: Appears mild-chronic issue-stable for outpatient follow-up  Polysubstance abuse: Acknowledges tobacco, alcohol (claims only social drinker) and cocaine (snorts) use (claims infrequent-"once in a while").  Counseled-not sure if he has any intention of quitting at this point.  Diet: Diet Order            Diet clear liquid Room service appropriate? Yes; Fluid consistency: Thin  Diet effective now                  Code Status: Full code   Family Communication: None at bedside  Disposition Plan: Status is: Inpatient  Remains inpatient appropriate because:Inpatient level of care appropriate due to severity of illness   Dispo: The patient is from: Home              Anticipated d/c is to: Home              Anticipated d/c date is: 3 days              Patient currently is not medically stable to d/c.  Barriers to Discharge: Severe hypoxia requiring BiPAP on presentation-ongoing GI bleeding with acute blood loss anemia-requiring 30 night of PRBC transfusion this morning.  GI planning endoscopic evaluation.  Antimicrobial agents: Anti-infectives (From admission, onward)   None  Time spent: 35 minutes-Greater than 50% of this time was spent in counseling, explanation of diagnosis, planning of further management, and coordination of care.  MEDICATIONS: Scheduled Meds: . amLODipine  12.5 mg Oral Daily  . Chlorhexidine Gluconate Cloth  6 each Topical Q0600  . [START ON 01/16/2020] darbepoetin (ARANESP) injection - DIALYSIS  200 mcg Intravenous Q Thu-HD  . metoprolol succinate  50 mg Oral BID  .  nicotine  21 mg Transdermal Daily  . [START ON 01/17/2020] pantoprazole  40 mg Intravenous Q12H  . QUEtiapine  100 mg Oral QHS  . sevelamer carbonate  4,800 mg Oral TID WC  . sodium chloride flush  3 mL Intravenous Q12H   Continuous Infusions: . sodium chloride    . sodium chloride    . pantoprozole (PROTONIX) infusion 8 mg/hr (01/15/20 0128)   PRN Meds:.sodium chloride, sodium chloride, acetaminophen **OR** acetaminophen, alteplase, bismuth subsalicylate, heparin, hydrALAZINE, ipratropium-albuterol, lidocaine (PF), lidocaine-prilocaine, metoprolol tartrate, ondansetron **OR** ondansetron (ZOFRAN) IV, pentafluoroprop-tetrafluoroeth   PHYSICAL EXAM: Vital signs: Vitals:   01/15/20 0734 01/15/20 0745 01/15/20 0746 01/15/20 0900  BP:  (!) 141/96 138/87 111/69  Pulse:    85  Resp:  18 18 18   Temp:  97.6 F (36.4 C)  98 F (36.7 C)  TempSrc:  Oral  Oral  SpO2: 100% 100%  99%   Filed Weights   There is no height or weight on file to calculate BMI.   Gen Exam:Alert awake-not in any distress HEENT:atraumatic, normocephalic Chest: B/L clear to auscultation anteriorly CVS:S1S2 regular Abdomen:soft non tender, non distended Extremities:no edema Neurology: Non focal Skin: no rash  I have personally reviewed following labs and imaging studies  LABORATORY DATA: CBC: Recent Labs  Lab 01/14/20 0600 01/14/20 0607 01/14/20 0753 01/14/20 1644 01/15/20 0317  WBC 5.9  --  3.1*  --  1.6*  NEUTROABS 3.0  --   --   --   --   HGB 5.4* 6.5* 5.6* 7.0* 6.5*  HCT 18.4* 19.0* 18.9* 22.4* 21.0*  MCV 108.2*  --  109.9*  --  97.7  PLT 140*  --  120*  --  123*    Basic Metabolic Panel: Recent Labs  Lab 01/14/20 0600 01/14/20 0607 01/14/20 0753 01/15/20 0317  NA 140 139 140 136  K 5.3* 5.1 5.5* 5.2*  CL 96* 98 96* 97*  CO2 25  --  23 28  GLUCOSE 147* 145* 110* 85  BUN 125* 127* 125* 46*  CREATININE 14.51* 15.10* 14.62* 7.49*  CALCIUM 9.6  --  9.6 8.8*  PHOS  --   --  5.6*  --      GFR: CrCl cannot be calculated (Unknown ideal weight.).  Liver Function Tests: Recent Labs  Lab 01/14/20 0753 01/15/20 0317  AST  --  12*  ALT  --  13  ALKPHOS  --  45  BILITOT  --  0.7  PROT  --  6.2*  ALBUMIN 3.3* 2.9*   No results for input(s): LIPASE, AMYLASE in the last 168 hours. No results for input(s): AMMONIA in the last 168 hours.  Coagulation Profile: No results for input(s): INR, PROTIME in the last 168 hours.  Cardiac Enzymes: No results for input(s): CKTOTAL, CKMB, CKMBINDEX, TROPONINI in the last 168 hours.  BNP (last 3 results) No results for input(s): PROBNP in the last 8760 hours.  Lipid Profile: No results for input(s): CHOL, HDL, LDLCALC, TRIG, CHOLHDL, LDLDIRECT in the last 72 hours.  Thyroid Function Tests: No results for  input(s): TSH, T4TOTAL, FREET4, T3FREE, THYROIDAB in the last 72 hours.  Anemia Panel: Recent Labs    01/15/20 0317  VITAMINB12 608  FOLATE 8.7  FERRITIN 160  TIBC 316  IRON 123    Urine analysis:    Component Value Date/Time   COLORURINE YELLOW 05/08/2010 [place] 05/08/2010 1335   LABSPEC 1.009 05/08/2010 1335   PHURINE 6.0 05/08/2010 1335   GLUCOSEU NEGATIVE 05/08/2010 1335   HGBUR NEGATIVE 05/08/2010 1335   BILIRUBINUR NEGATIVE 05/08/2010 1335   KETONESUR NEGATIVE 05/08/2010 1335   PROTEINUR 100 (A) 05/08/2010 1335   UROBILINOGEN 0.2 05/08/2010 1335   NITRITE NEGATIVE 05/08/2010 1335   LEUKOCYTESUR SMALL (A) 05/08/2010 1335    Sepsis Labs: Lactic Acid, Venous    Component Value Date/Time   LATICACIDVEN 1.2 05/28/2019 1820    MICROBIOLOGY: Recent Results (from the past 240 hour(s))  SARS Coronavirus 2 by RT PCR (hospital order, performed in [place] hospital lab) Nasopharyngeal Nasopharyngeal Swab     Status: None   Collection Time: 01/14/20  5:58 AM   Specimen: Nasopharyngeal Swab  Result Value Ref Range Status   SARS Coronavirus 2 NEGATIVE NEGATIVE Final    Comment:  (NOTE) SARS-CoV-2 target nucleic acids are NOT DETECTED.  The SARS-CoV-2 RNA is generally detectable in upper and lower respiratory specimens during the acute phase of infection. The lowest concentration of SARS-CoV-2 viral copies this assay can detect is 250 copies / mL. A negative result does not preclude SARS-CoV-2 infection and should not be used as the sole basis for treatment or other patient management decisions.  A negative result may occur with improper specimen collection / handling, submission of specimen other than nasopharyngeal swab, presence of viral mutation(s) within the areas targeted by this assay, and inadequate number of viral copies (<250 copies / mL). A negative result must be combined with clinical observations, patient history, and epidemiological information.  Fact Sheet for Patients:   StrictlyIdeas.no  Fact Sheet for Healthcare Providers: BankingDealers.co.za  This test is not yet approved or  cleared by the [place] FDA and has been authorized for detection and/or diagnosis of SARS-CoV-2 by FDA under an Emergency Use Authorization (EUA).  This EUA will remain in effect (meaning this test can be used) for the duration of the COVID-19 declaration under Section 564(b)(1) of the Act, 21 U.S.C. section 360bbb-3(b)(1), unless the authorization is terminated or revoked sooner.  Performed at [facility], [place] [address] [place], [place] [address]   MRSA PCR Screening     Status: None   Collection Time: 01/14/20  6:52 PM   Specimen: Nasal Mucosa; Nasopharyngeal  Result Value Ref Range Status   MRSA by PCR NEGATIVE NEGATIVE Final    Comment:        The GeneXpert MRSA Assay (FDA approved for NASAL specimens only), is one component of a comprehensive MRSA colonization surveillance program. It is not intended to diagnose MRSA infection nor to guide or monitor treatment for MRSA  infections. Performed at [facility], [place] [address] [place],  [address]     RADIOLOGY STUDIES/RESULTS: DG Chest Portable 1 View  Result Date: 01/14/2020 CLINICAL DATA:  Shortness of breath.  History of COPD and dialysis. EXAM: PORTABLE CHEST 1 VIEW COMPARISON:  05/28/2019. FINDINGS: Cardiomegaly with pulmonary venous congestion and bilateral interstitial prominence. Tiny bilateral pleural effusions. These findings are most consistent with CHF. Pneumonitis cannot be excluded. No pneumothorax. No acute bony abnormality. IMPRESSION: Cardiomegaly with pulmonary  venous congestion and bilateral interstitial prominence. Tiny bilateral pleural effusions. Findings most consistent with CHF. Electronically Signed   By: Marcello Moores  Register   On: 01/14/2020 06:24     LOS: 1 day   Oren Binet, MD  [facility]    To contact the attending provider between 7A-7P or the covering provider during after hours 7P-7A, please log into the web site [email or web address] and access using universal [place] password for that web site. If you do not have the password, please call the hospital operator.  01/15/2020, 9:30 AM

## 2020-01-15 NOTE — Progress Notes (Addendum)
[facility] Progress Note  CC:  Anemia, Melena   Subjective: He complains of having stomach burning after eating a clear liquid breakfast. No N/V. He passed a BM yesterday, he didn't look at it. No BM today. No cough or SOB. He is on oxygen 2L [place]. No other complaints. His sister Hassan Rowan is at the bedside.    Objective:  Vital signs in last 24 hours: Temp:  [97.2 F (36.2 C)-98.5 F (36.9 C)] 98.4 F (36.9 C) (07/07 0615) Pulse Rate:  [88-134] 104 (07/07 0551) Resp:  [13-25] 15 (07/07 0551) BP: (100-177)/(70-119) 135/95 (07/07 0615) SpO2:  [96 %-100 %] 100 % (07/07 0551) FiO2 (%):  [40 %] 40 % (07/06 0847) Last BM Date: 01/14/20 General:   Alert in NAD.  Heart: Slightly irregular rhythm. No murmur.  Pulm:   Coarse expiratory wheezes LLQ otherwise clear throughout.  Abdomen: Soft, nondistended. Nontender. + BS x 4 quads. No HSM.  Extremities:  Without edema. LUE fistula with + bruit and thrill.  Neurologic:  Alert and  oriented x4;  grossly normal neurologically. Psych:  Alert and cooperative. Normal mood and affect.  Intake/Output from previous day: 07/06 0701 - 07/07 0700 In: 182.8 [I.V.:182.8] Out: -  Intake/Output this shift: No intake/output data recorded.  Lab Results: Recent Labs    01/14/20 0600 01/14/20 0607 01/14/20 0753 01/14/20 1644 01/15/20 0317  WBC 5.9  --  3.1*  --  1.6*  HGB 5.4*   < > 5.6* 7.0* 6.5*  HCT 18.4*   < > 18.9* 22.4* 21.0*  PLT 140*  --  120*  --  123*   < > = values in this interval not displayed.   BMET Recent Labs    01/14/20 0600 01/14/20 0600 01/14/20 0607 01/14/20 0753 01/15/20 0317  NA 140   < > 139 140 136  K 5.3*   < > 5.1 5.5* 5.2*  CL 96*   < > 98 96* 97*  CO2 25  --   --  23 28  GLUCOSE 147*   < > 145* 110* 85  BUN 125*   < > 127* 125* 46*  CREATININE 14.51*   < > 15.10* 14.62* 7.49*  CALCIUM 9.6  --   --  9.6 8.8*   < > = values in this interval not displayed.   LFT Recent Labs     01/15/20 0317  PROT 6.2*  ALBUMIN 2.9*  AST 12*  ALT 13  ALKPHOS 45  BILITOT 0.7  BILIDIR 0.1  IBILI 0.6   PT/INR No results for input(s): LABPROT, INR in the last 72 hours. Hepatitis Panel Recent Labs    01/14/20 0753  HEPBSAG NON REACTIVE    DG Chest Portable 1 View  Result Date: 01/14/2020 CLINICAL DATA:  Shortness of breath.  History of COPD and dialysis. EXAM: PORTABLE CHEST 1 VIEW COMPARISON:  05/28/2019. FINDINGS: Cardiomegaly with pulmonary venous congestion and bilateral interstitial prominence. Tiny bilateral pleural effusions. These findings are most consistent with CHF. Pneumonitis cannot be excluded. No pneumothorax. No acute bony abnormality. IMPRESSION: Cardiomegaly with pulmonary venous congestion and bilateral interstitial prominence. Tiny bilateral pleural effusions. Findings most consistent with CHF. Electronically Signed   By: Marcello Moores  Register   On: 01/14/2020 06:24    Assessment / Plan:  41. 64 year old male with history of small bowel AVMs admitted to the hospital with GI bleed/melena with acute on chronic anemia.  Admission  Hg 5.4. (baseline Hg 8-9). Transfused 2 units  of PRBCs on 7/6. Post transfusion Hg 7.0. Today Hg down to 6.5. HCT 21.0. Transfused 1 unit or PRBCs this am. Iron 123. TIBC 316. Ferritin 160. Vitamin B12 608. Remains on Pantoprazole IV infusion. No active signs of GI bleeding.  -Transfuse for Hg < 7 -Check H/H post transfusion  -Continue Pantoprazole 8mg /hr infusion  -Push enteroscopy when respiratory status stable  -Our office staff was contacted to follow up on small bowel capsule endoscopy 08/26/2019, patient reported he retrieved capsule and mailed it. It is unlikely we will obtain the results of this lost capsule study. Consider placement of small bowel capsule at time of push enteroscopy. Further recommendations per Dr. Lyndel Safe.  -Repeat CBC in am  2. Acute respiratory failure secondary to acute pulmonary edema + COPD exacerbation  required BIPAP.  -Repeat chest xray in am  3. ESRD on HD (TTS)  4. History of paroxysmal atrial fibrillation on Amiodarone, not on anticoagulation   5. Thrombocytopenia, most likely due to cirrhosis   6. History of poly substance abuse (EtOH/cocaine/tobacco)  7. History of colon polyps. Colonoscopy 11/19/2018 2 tubular adenomatous polyps were removed. -Recall colonoscopy due 11/2023  8. Elevated Troponin level  9. Hyperkalemia  -Management per the hospitalist  Active Problems:   ESRD (end stage renal disease) on dialysis ([place])   Polysubstance abuse ([place])   COPD (chronic obstructive pulmonary disease) (HCC)   End-stage renal disease on hemodialysis ([place])   Acute pulmonary edema (HCC)   PAD (peripheral artery disease) (HCC)   Acute respiratory failure with hypoxia (HCC)   PAF (paroxysmal atrial fibrillation) ([place])   History of arteriovenous malformation (AVM)   GI bleed   Acute blood loss anemia   Elevated troponin     LOS: 1 day   Noralyn Pick  01/15/2020, 7:39 AM   Attending physician's note   I have taken an interval history, reviewed the chart and examined the patient. I agree with the Advanced Practitioner's note, impression and recommendations.   64yr old with ESRD on HD (TTS), known duodenal AVMs, A Fib (not on AC but on amiodarone), polysubstance abuse (ETOH/cocaine/tobacco), H/O hep C s/p treatment with SVR 11/2016 (no cirrhosis but has thrombocytopenia), CAD, HTN, PVD, PAD  Adm d/t acute on chronic resp failure requiring BIPAP (d/t COPD/pum edema/CHF)  Melena with adm Hb 5.4 s/p 3U (baseline 8-9). EGD Feb 2020 with duodenal AVMs s/p APC). Neg colon Feb 2020 except for small TAs and diverticulosis. Had ?VCE- report NA)  Elevated troponin.  Plan: -Continue Protonix. -Trend CBC. -CTA Abdo/pelvis -Push enteroscopy (with capsule if neg) once respiratory status stabilizes (likely 7/9)  Carmell Austria, MD [facility] [phone].

## 2020-01-15 NOTE — Progress Notes (Signed)
CRITICAL VALUE ALERT  Critical Value:  HGB 6.5  Date & Time Notied:  01/15/20 @0445   Provider Notified: Opyd  Orders Received/Actions taken: awaiting orders

## 2020-01-16 DIAGNOSIS — F1910 Other psychoactive substance abuse, uncomplicated: Secondary | ICD-10-CM

## 2020-01-16 DIAGNOSIS — K922 Gastrointestinal hemorrhage, unspecified: Secondary | ICD-10-CM

## 2020-01-16 LAB — BPAM RBC
Blood Product Expiration Date: 202107262359
Blood Product Expiration Date: 202107262359
Blood Product Expiration Date: 202107282359
ISSUE DATE / TIME: 202107060815
ISSUE DATE / TIME: 202107061052
ISSUE DATE / TIME: 202107070547
Unit Type and Rh: 7300
Unit Type and Rh: 7300
Unit Type and Rh: 7300

## 2020-01-16 LAB — RENAL FUNCTION PANEL
Albumin: 2.7 g/dL — ABNORMAL LOW (ref 3.5–5.0)
Anion gap: 11 (ref 5–15)
BUN: 56 mg/dL — ABNORMAL HIGH (ref 8–23)
CO2: 27 mmol/L (ref 22–32)
Calcium: 9.2 mg/dL (ref 8.9–10.3)
Chloride: 96 mmol/L — ABNORMAL LOW (ref 98–111)
Creatinine, Ser: 9.54 mg/dL — ABNORMAL HIGH (ref 0.61–1.24)
GFR calc Af Amer: 6 mL/min — ABNORMAL LOW (ref >60)
GFR calc non Af Amer: 5 mL/min — ABNORMAL LOW (ref >60)
Glucose, Bld: 84 mg/dL (ref 70–99)
Phosphorus: 5 mg/dL — ABNORMAL HIGH (ref 2.5–4.6)
Potassium: 5.1 mmol/L (ref 3.5–5.1)
Sodium: 134 mmol/L — ABNORMAL LOW (ref 135–145)

## 2020-01-16 LAB — TYPE AND SCREEN
ABO/RH(D): B POS
Antibody Screen: NEGATIVE
Unit division: 0
Unit division: 0
Unit division: 0

## 2020-01-16 LAB — CBC
HCT: 24.9 % — ABNORMAL LOW (ref 39.0–52.0)
Hemoglobin: 8 g/dL — ABNORMAL LOW (ref 13.0–17.0)
MCH: 31.3 pg (ref 26.0–34.0)
MCHC: 32.1 g/dL (ref 30.0–36.0)
MCV: 97.3 fL (ref 80.0–100.0)
Platelets: 128 10*3/uL — ABNORMAL LOW (ref 150–400)
RBC: 2.56 MIL/uL — ABNORMAL LOW (ref 4.22–5.81)
RDW: 20 % — ABNORMAL HIGH (ref 11.5–15.5)
WBC: 2.4 10*3/uL — ABNORMAL LOW (ref 4.0–10.5)
nRBC: 0 % (ref 0.0–0.2)

## 2020-01-16 MED ORDER — DARBEPOETIN ALFA 200 MCG/0.4ML IJ SOSY
PREFILLED_SYRINGE | INTRAMUSCULAR | Status: AC
  Administered 2020-01-16: 200 ug via INTRAVENOUS
  Filled 2020-01-16: qty 0.4

## 2020-01-16 NOTE — Anesthesia Preprocedure Evaluation (Addendum)
Anesthesia Evaluation  Patient identified by MRN, date of birth, ID band Patient awake    Reviewed: Allergy & Precautions, NPO status , Patient's Chart, lab work & pertinent test results  Airway Mallampati: II  TM Distance: >3 FB Neck ROM: Full    Dental no notable dental hx. (+) Missing, Dental Advisory Given,    Pulmonary COPD, Current Smoker and Patient abstained from smoking.,    Pulmonary exam normal breath sounds clear to auscultation       Cardiovascular hypertension, Pt. on medications + CAD and + Peripheral Vascular Disease  Normal cardiovascular exam Rhythm:Irregular Rate:Normal  10/19 Echo Left ventricle: Wall thickness was increased in a pattern of  severe LVH. Systolic function was normal. The estimated ejection  fraction was in the range of 60% to 65%. Although no diagnostic  regional wall motion abnormality was identified, this possibility  cannot be completely excluded on the basis of this study. The  study was not technically sufficient to allow evaluation of LV  diastolic dysfunction due to atrial fibrillation.  - Aortic valve: There was no stenosis   Neuro/Psych negative neurological ROS  negative psych ROS   GI/Hepatic (+)     substance abuse  , Hepatitis -, C  Endo/Other    Renal/GU ESRF and DialysisRenal diseaseTTS Dialysis     Musculoskeletal   Abdominal   Peds  Hematology  (+) Blood dyscrasia, anemia , Lab Results      Component                Value               Date                      WBC                      2.4 (L)             01/16/2020                HGB                      8.0 (L)             01/16/2020                HCT                      24.9 (L)            01/16/2020                MCV                      97.3                01/16/2020                PLT                      128 (L)             01/16/2020              Anesthesia Other Findings    Reproductive/Obstetrics                            Anesthesia Physical Anesthesia Plan  ASA: III  Anesthesia Plan: MAC   Post-op Pain Management:    Induction: Intravenous  PONV Risk Score and Plan: Treatment may vary due to age or medical condition  Airway Management Planned: Nasal Cannula and Natural Airway  Additional Equipment:   Intra-op Plan:   Post-operative Plan:   Informed Consent: I have reviewed the patients History and Physical, chart, labs and discussed the procedure including the risks, benefits and alternatives for the proposed anesthesia with the patient or authorized representative who has indicated his/her understanding and acceptance.     Dental advisory given  Plan Discussed with:   Anesthesia Plan Comments:        Anesthesia Quick Evaluation

## 2020-01-16 NOTE — H&P (View-Only) (Signed)
Progress Note    ASSESSMENT AND PLAN:   64yr old with ESRD on HD (TTS), known duodenal AVMs, A Fib (not on AC but on amiodarone), polysubstance abuse (ETOH/cocaine/tobacco), H/O hep C s/p treatment with SVR 11/2016 (no cirrhosis but has thrombocytopenia), CAD, HTN, PVD, PAD  Respiratory status is improved.  Melena on adm Hb 5.4 s/p 3U (baseline 8-9). EGD Feb 2020 with duodenal AVMs s/p APC). Neg colon Feb 2020 except for small TAs and diverticulosis. Neg CTA this admission.  No liver cirrhosis.  Has gallbladder wall thickening but no tenderness RUQ. Nl LFTs except albumin 2.9   Plan: -Continue Protonix. -Trend CBC. -Push enteroscopy in AM.  I have discussed risks and benefits in detail.   SUBJECTIVE  Feels much better No further melena No abdominal pain Respiratory status is improved considerably      OBJECTIVE:     Vital signs in last 24 hours: Temp:  [97.6 F (36.4 C)-98.6 F (37 C)] 98.6 F (37 C) (07/08 1543) Pulse Rate:  [68-127] 103 (07/08 1200) Resp:  [18-21] 20 (07/08 1200) BP: (91-170)/(49-93) 118/77 (07/08 1543) SpO2:  [93 %-100 %] 98 % (07/08 1200) Weight:  [57.7 kg] 57.7 kg (07/08 0725) Last BM Date: 01/14/20 General:   Alert, well-developed male in NAD EENT:  Normal hearing, non icteric sclera, conjunctive pink.  Heart:  Regular rate and rhythm; no murmur.  No lower extremity edema   Pulm: Normal respiratory effort, lungs CTA bilaterally without wheezes or crackles. Abdomen:  Soft, nondistended, nontender.  Normal bowel sounds,.       Neurologic:  Alert and  oriented x4;  grossly normal neurologically. Psych:  Pleasant, cooperative.  Normal mood and affect.   Intake/Output from previous day: 07/07 0701 - 07/08 0700 In: 366 [Blood:366] Out: -  Intake/Output this shift: Total I/O In: 0  Out: 3600 [Other:3600]  Lab Results: Recent Labs    01/15/20 0317 01/15/20 0317 01/15/20 1435 01/15/20 2046 01/16/20 0341  WBC 1.6*  --   --   2.9* 2.4*  HGB 6.5*   < > 8.0* 8.4* 8.0*  HCT 21.0*   < > 25.0* 26.5* 24.9*  PLT 123*  --   --  129* 128*   < > = values in this interval not displayed.   BMET Recent Labs    01/14/20 0753 01/15/20 0317 01/16/20 0341  NA 140 136 134*  K 5.5* 5.2* 5.1  CL 96* 97* 96*  CO2 23 28 27   GLUCOSE 110* 85 84  BUN 125* 46* 56*  CREATININE 14.62* 7.49* 9.54*  CALCIUM 9.6 8.8* 9.2   LFT Recent Labs    01/15/20 0317 01/15/20 0317 01/16/20 0341  PROT 6.2*  --   --   ALBUMIN 2.9*   < > 2.7*  AST 12*  --   --   ALT 13  --   --   ALKPHOS 45  --   --   BILITOT 0.7  --   --   BILIDIR 0.1  --   --   IBILI 0.6  --   --    < > = values in this interval not displayed.   PT/INR No results for input(s): LABPROT, INR in the last 72 hours. Hepatitis Panel Recent Labs    01/14/20 0753  HEPBSAG NON REACTIVE    CT Angio Abd/Pel w/ and/or w/o  Result Date: 01/15/2020 CLINICAL DATA:  64 year old male with GI bleed. EXAM: CTA ABDOMEN AND PELVIS WITHOUT AND WITH CONTRAST  TECHNIQUE: Multidetector CT imaging of the abdomen and pelvis was performed using the standard protocol during bolus administration of intravenous contrast. Multiplanar reconstructed images and MIPs were obtained and reviewed to evaluate the vascular anatomy. CONTRAST:  135mL OMNIPAQUE IOHEXOL 350 MG/ML SOLN COMPARISON:  CT abdomen pelvis dated 12/24/2018. FINDINGS: VASCULAR Aorta: Advanced atherosclerotic calcification of the abdominal aorta. There is a 2.7 cm infrarenal aortic ectasia. No aneurysmal dilatation or dissection. No periaortic fluid. Celiac: Mild focal narrowing of the origin of the celiac artery. The celiac artery and its major branches are patent. SMA: There is atherosclerotic calcification of the origin of the SMA. The SMA is patent. There is replaced hepatic artery arising from the SMA. Renals: Atherosclerotic calcification of the renal artery ostia. There is diminished perfusion within the renal arteries. IMA: Patent  without evidence of aneurysm, dissection, vasculitis or significant stenosis. Inflow: There is atherosclerotic calcification of the iliac arteries. The iliac arteries remain patent. Proximal Outflow: Advanced atherosclerotic calcification of the visualized femoral arteries. There is complete occlusion of the visualized proximal portion of the superficial femoral arteries. Veins: The SMV, splenic vein, and main portal vein are patent. The IVC is unremarkable. No portal venous gas. Review of the MIP images confirms the above findings. NON-VASCULAR Lower chest: Partially visualized small bilateral pleural effusion and bibasilar subsegmental atelectasis. Pneumonia is not excluded. Clinical correlation is recommended. There is cardiomegaly and coronary vascular calcification. No intra-abdominal free air or free fluid. Hepatobiliary: The liver is unremarkable. There is mild intrahepatic biliary ductal dilatation versus mild periportal edema. Correlation with LFTs recommended. There is diffuse thickened appearance of the gallbladder wall with possible small pericholecystic fluid. Findings may be related to underlying liver disease although acute cholecystitis is not excluded. Correlation with clinical exam and further evaluation with ultrasound recommended. No calcified gallstone. Pancreas: Unremarkable. No pancreatic ductal dilatation or surrounding inflammatory changes. Spleen: Normal in size without focal abnormality. Adrenals/Urinary Tract: The adrenal glands are unremarkable. Moderate bilateral renal parenchyma atrophy indeterminate bilateral hypodense lesions. The larger lesions demonstrate fluid attenuation consistent with cysts. The smaller lesions are too small to characterize. There is no hydronephrosis on either side. The visualized ureters appear unremarkable. The urinary bladder is collapsed. Stomach/Bowel: Several small scattered sigmoid diverticula without active inflammatory changes. There is no bowel  obstruction or active inflammation. Normal appendix. No evidence of active GI bleed. Lymphatic: No adenopathy. Reproductive: The prostate and seminal vesicles are grossly unremarkable. Other: Small fat containing umbilical hernia. Musculoskeletal: Osteopenia with degenerative changes of the spine. No acute osseous pathology. IMPRESSION: 1. No CT evidence of active GI bleed. 2. Thickened appearance of the gallbladder wall with possible small pericholecystic fluid. Findings may be related to underlying liver disease although acute cholecystitis is not excluded. Correlation with clinical exam and further evaluation with ultrasound recommended. 3. Small scattered sigmoid diverticula. No bowel obstruction. Normal appendix. 4. Cardiomegaly with coronary vascular calcification. 5. Partially visualized small bilateral pleural effusions and bibasilar subsegmental atelectasis. Pneumonia is not excluded. Clinical correlation is recommended. 6. Aortic Atherosclerosis (ICD10-I70.0). Electronically Signed   By: Anner Crete M.D.   On: 01/15/2020 18:43     Active Problems:   ESRD (end stage renal disease) ([place])   Polysubstance abuse (HCC)   COPD (chronic obstructive pulmonary disease) (HCC)   End-stage renal disease on hemodialysis ([place])   Acute pulmonary edema (HCC)   PAD (peripheral artery disease) (HCC)   Acute respiratory failure with hypoxia (HCC)   PAF (paroxysmal atrial fibrillation) ([place])   History of arteriovenous  malformation (AVM)   GI bleed   Acute blood loss anemia   Elevated troponin     LOS: 2 days     Carmell Austria, MD 01/16/2020, 5:11 PM [facility] [phone]

## 2020-01-16 NOTE — Progress Notes (Signed)
Progress Note    ASSESSMENT AND PLAN:   64yr old with ESRD on HD (TTS), known duodenal AVMs, A Fib (not on AC but on amiodarone), polysubstance abuse (ETOH/cocaine/tobacco), H/O hep C s/p treatment with SVR 11/2016 (no cirrhosis but has thrombocytopenia), CAD, HTN, PVD, PAD  Respiratory status is improved.  Melena on adm Hb 5.4 s/p 3U (baseline 8-9). EGD Feb 2020 with duodenal AVMs s/p APC). Neg colon Feb 2020 except for small TAs and diverticulosis. Neg CTA this admission.  No liver cirrhosis.  Has gallbladder wall thickening but no tenderness RUQ. Nl LFTs except albumin 2.9   Plan: -Continue Protonix. -Trend CBC. -Push enteroscopy in AM.  I have discussed risks and benefits in detail.   SUBJECTIVE  Feels much better No further melena No abdominal pain Respiratory status is improved considerably      OBJECTIVE:     Vital signs in last 24 hours: Temp:  [97.6 F (36.4 C)-98.6 F (37 C)] 98.6 F (37 C) (07/08 1543) Pulse Rate:  [68-127] 103 (07/08 1200) Resp:  [18-21] 20 (07/08 1200) BP: (91-170)/(49-93) 118/77 (07/08 1543) SpO2:  [93 %-100 %] 98 % (07/08 1200) Weight:  [57.7 kg] 57.7 kg (07/08 0725) Last BM Date: 01/14/20 General:   Alert, well-developed male in NAD EENT:  Normal hearing, non icteric sclera, conjunctive pink.  Heart:  Regular rate and rhythm; no murmur.  No lower extremity edema   Pulm: Normal respiratory effort, lungs CTA bilaterally without wheezes or crackles. Abdomen:  Soft, nondistended, nontender.  Normal bowel sounds,.       Neurologic:  Alert and  oriented x4;  grossly normal neurologically. Psych:  Pleasant, cooperative.  Normal mood and affect.   Intake/Output from previous day: 07/07 0701 - 07/08 0700 In: 366 [Blood:366] Out: -  Intake/Output this shift: Total I/O In: 0  Out: 3600 [Other:3600]  Lab Results: Recent Labs    01/15/20 0317 01/15/20 0317 01/15/20 1435 01/15/20 2046 01/16/20 0341  WBC 1.6*  --   --   2.9* 2.4*  HGB 6.5*   < > 8.0* 8.4* 8.0*  HCT 21.0*   < > 25.0* 26.5* 24.9*  PLT 123*  --   --  129* 128*   < > = values in this interval not displayed.   BMET Recent Labs    01/14/20 0753 01/15/20 0317 01/16/20 0341  NA 140 136 134*  K 5.5* 5.2* 5.1  CL 96* 97* 96*  CO2 23 28 27   GLUCOSE 110* 85 84  BUN 125* 46* 56*  CREATININE 14.62* 7.49* 9.54*  CALCIUM 9.6 8.8* 9.2   LFT Recent Labs    01/15/20 0317 01/15/20 0317 01/16/20 0341  PROT 6.2*  --   --   ALBUMIN 2.9*   < > 2.7*  AST 12*  --   --   ALT 13  --   --   ALKPHOS 45  --   --   BILITOT 0.7  --   --   BILIDIR 0.1  --   --   IBILI 0.6  --   --    < > = values in this interval not displayed.   PT/INR No results for input(s): LABPROT, INR in the last 72 hours. Hepatitis Panel Recent Labs    01/14/20 0753  HEPBSAG NON REACTIVE    CT Angio Abd/Pel w/ and/or w/o  Result Date: 01/15/2020 CLINICAL DATA:  64 year old male with GI bleed. EXAM: CTA ABDOMEN AND PELVIS WITHOUT AND WITH CONTRAST  TECHNIQUE: Multidetector CT imaging of the abdomen and pelvis was performed using the standard protocol during bolus administration of intravenous contrast. Multiplanar reconstructed images and MIPs were obtained and reviewed to evaluate the vascular anatomy. CONTRAST:  185mL OMNIPAQUE IOHEXOL 350 MG/ML SOLN COMPARISON:  CT abdomen pelvis dated 12/24/2018. FINDINGS: VASCULAR Aorta: Advanced atherosclerotic calcification of the abdominal aorta. There is a 2.7 cm infrarenal aortic ectasia. No aneurysmal dilatation or dissection. No periaortic fluid. Celiac: Mild focal narrowing of the origin of the celiac artery. The celiac artery and its major branches are patent. SMA: There is atherosclerotic calcification of the origin of the SMA. The SMA is patent. There is replaced hepatic artery arising from the SMA. Renals: Atherosclerotic calcification of the renal artery ostia. There is diminished perfusion within the renal arteries. IMA: Patent  without evidence of aneurysm, dissection, vasculitis or significant stenosis. Inflow: There is atherosclerotic calcification of the iliac arteries. The iliac arteries remain patent. Proximal Outflow: Advanced atherosclerotic calcification of the visualized femoral arteries. There is complete occlusion of the visualized proximal portion of the superficial femoral arteries. Veins: The SMV, splenic vein, and main portal vein are patent. The IVC is unremarkable. No portal venous gas. Review of the MIP images confirms the above findings. NON-VASCULAR Lower chest: Partially visualized small bilateral pleural effusion and bibasilar subsegmental atelectasis. Pneumonia is not excluded. Clinical correlation is recommended. There is cardiomegaly and coronary vascular calcification. No intra-abdominal free air or free fluid. Hepatobiliary: The liver is unremarkable. There is mild intrahepatic biliary ductal dilatation versus mild periportal edema. Correlation with LFTs recommended. There is diffuse thickened appearance of the gallbladder wall with possible small pericholecystic fluid. Findings may be related to underlying liver disease although acute cholecystitis is not excluded. Correlation with clinical exam and further evaluation with ultrasound recommended. No calcified gallstone. Pancreas: Unremarkable. No pancreatic ductal dilatation or surrounding inflammatory changes. Spleen: Normal in size without focal abnormality. Adrenals/Urinary Tract: The adrenal glands are unremarkable. Moderate bilateral renal parenchyma atrophy indeterminate bilateral hypodense lesions. The larger lesions demonstrate fluid attenuation consistent with cysts. The smaller lesions are too small to characterize. There is no hydronephrosis on either side. The visualized ureters appear unremarkable. The urinary bladder is collapsed. Stomach/Bowel: Several small scattered sigmoid diverticula without active inflammatory changes. There is no bowel  obstruction or active inflammation. Normal appendix. No evidence of active GI bleed. Lymphatic: No adenopathy. Reproductive: The prostate and seminal vesicles are grossly unremarkable. Other: Small fat containing umbilical hernia. Musculoskeletal: Osteopenia with degenerative changes of the spine. No acute osseous pathology. IMPRESSION: 1. No CT evidence of active GI bleed. 2. Thickened appearance of the gallbladder wall with possible small pericholecystic fluid. Findings may be related to underlying liver disease although acute cholecystitis is not excluded. Correlation with clinical exam and further evaluation with ultrasound recommended. 3. Small scattered sigmoid diverticula. No bowel obstruction. Normal appendix. 4. Cardiomegaly with coronary vascular calcification. 5. Partially visualized small bilateral pleural effusions and bibasilar subsegmental atelectasis. Pneumonia is not excluded. Clinical correlation is recommended. 6. Aortic Atherosclerosis (ICD10-I70.0). Electronically Signed   By: Anner Crete M.D.   On: 01/15/2020 18:43     Active Problems:   ESRD (end stage renal disease) ([place])   Polysubstance abuse (HCC)   COPD (chronic obstructive pulmonary disease) (HCC)   End-stage renal disease on hemodialysis ([place])   Acute pulmonary edema (HCC)   PAD (peripheral artery disease) (HCC)   Acute respiratory failure with hypoxia (HCC)   PAF (paroxysmal atrial fibrillation) ([place])   History of arteriovenous  malformation (AVM)   GI bleed   Acute blood loss anemia   Elevated troponin     LOS: 2 days     Carmell Austria, MD 01/16/2020, 5:11 PM [facility] [phone]

## 2020-01-16 NOTE — Progress Notes (Addendum)
[facility] Progress Note   Subjective:  Seen in HD unit. Some SOB overnight. UF goal 4L, tolerating UF so far.   Objective Vitals:   01/16/20 0341 01/16/20 0725 01/16/20 0733 01/16/20 0800  BP: 127/69 (!) 148/76 137/90 (!) 145/90  Pulse:  90 72 86  Resp:  (!) 21    Temp: 98.5 F (36.9 C) 97.6 F (36.4 C)    TempSrc: Oral Oral    SpO2:  100%    Weight:  57.7 kg        Additional Objective Labs: Basic Metabolic Panel: Recent Labs  Lab 01/14/20 0753 01/15/20 0317 01/16/20 0341  NA 140 136 134*  K 5.5* 5.2* 5.1  CL 96* 97* 96*  CO2 23 28 27   GLUCOSE 110* 85 84  BUN 125* 46* 56*  CREATININE 14.62* 7.49* 9.54*  CALCIUM 9.6 8.8* 9.2  PHOS 5.6*  --  5.0*   CBC: Recent Labs  Lab 01/14/20 0600 01/14/20 0607 01/14/20 0753 01/14/20 1644 01/15/20 0317 01/15/20 0317 01/15/20 1435 01/15/20 2046 01/16/20 0341  WBC 5.9   < > 3.1*  --  1.6*  --   --  2.9* 2.4*  NEUTROABS 3.0  --   --   --   --   --   --   --   --   HGB 5.4*   < > 5.6*   < > 6.5*   < > 8.0* 8.4* 8.0*  HCT 18.4*   < > 18.9*   < > 21.0*   < > 25.0* 26.5* 24.9*  MCV 108.2*  --  109.9*  --  97.7  --   --  96.0 97.3  PLT 140*   < > 120*  --  123*  --   --  129* 128*   < > = values in this interval not displayed.   Blood Culture    Component Value Date/Time   SDES BLOOD RIGHT HAND 12/24/2018 1912   SPECREQUEST  12/24/2018 1912    BOTTLES DRAWN AEROBIC ONLY Blood Culture results may not be optimal due to an inadequate volume of blood received in culture bottles   CULT  12/24/2018 1912    NO GROWTH 5 DAYS Performed at [facility], [place] [address] [place], [place] [address]    REPTSTATUS 12/29/2018 FINAL 12/24/2018 1912     Physical Exam General: Lying in bed on nasal oxygen  Heart: RRR Lungs: +rales at bases  Abdomen: soft non-tender  Extremities: no sig LE edema  Dialysis Access: LUE AVF +bruit   Medications: . sodium chloride    . sodium chloride    . pantoprozole  (PROTONIX) infusion 8 mg/hr (01/16/20 0327)   . amLODipine  12.5 mg Oral Daily  . Chlorhexidine Gluconate Cloth  6 each Topical Q0600  . darbepoetin (ARANESP) injection - DIALYSIS  200 mcg Intravenous Q Thu-HD  . metoprolol succinate  50 mg Oral BID  . nicotine  21 mg Transdermal Daily  . [START ON 01/17/2020] pantoprazole  40 mg Intravenous Q12H  . QUEtiapine  100 mg Oral QHS  . sevelamer carbonate  4,800 mg Oral TID WC  . sodium chloride flush  3 mL Intravenous Q12H    Dialysis Orders:  [facility] TTS 4h 450/800 EDW 68.5kg 2K/2Ca  UFP 4 AVF No heparin  Mircera 225 (last 6/8)  Calcitriol 1.20mcg PO TIW  Outpatient Hgb 10 on 6/24   Assessment/Plan: 1. Acute dyspnea/Pulm edema on CXR. Improving with volume removal on dialysis.  Remains on supp O2.  Monitor post HD.  2. GI Bleed/ABLA . FOBT+. Hgb 5.4 on admission. Hgb 8.0 s/p  2 u prbcs 7/6, 1 u 7/7.   On Protonix gtt. GI following -possible enteroscopy 3. ESRD -  HD TTS. HD today on schedule.  4. Hypertension/volume  - BP improving with UF. UF goal 4L today. Pre HD weight 57.7kg? -stood for weight per RN. Follow.  5. Anemia  - As above. Tsat 39%. Aranesp 200 with HD 7/8.  6. Metabolic bone disease -  Corr Ca 10.2 Phos ok. Continue Renvela binder/Calcitriol. Follow trends  7. AFib - on metoprolol. No anticoag 2/2 GI bleed.    Lynnda Child PA-C [facility] 01/16/2020,8:37 AM   Nephrology attending: Patient was seen and examined.  Chart reviewed.  I agree with assessment plan as outlined above. Admitted with symptomatic anemia received 3 units of blood transfusion.  Some shortness of breath.  Receiving dialysis today, UF as tolerated.  Hemoglobin remains stable.  Plan for endoscopy evaluation tomorrow by Angus Seller, MD [facility].

## 2020-01-16 NOTE — Progress Notes (Signed)
PROGRESS NOTE        PATIENT DETAILS Name: Raymond [name] Age: 64 y.o. Sex: male Date of Birth: [birth year] Admit Date: 01/14/2020 Admitting Physician Norval Morton, MD ZOX:WRUEA, Malva Limes., FNP  Brief Narrative: Patient is a 64 y.o. male with history of ESRD on HD TTS, CAD, PAF not on anticoagulation, PAD s/p femoropopliteal bypass in 2019, COPD, hepatitis C s/p interferon treatment, ongoing cocaine abuse, history of intestinal AVMs-who presented with shortness of breath-found to have acute hypoxic respiratory failure due to pulmonary edema requiring BiPAP support and severe acute blood loss anemia due to upper GI bleeding.  Significant events: 7/6>> admit to [facility]-severe hypoxia from pulmonary edema-and acute blood loss anemia from GI bleeding.  Significant studies: 7/6>> chest x-ray: Findings consistent with CHF. 04/26/2018>> echo: EF 60-65%  Antimicrobial therapy: None  Microbiology data: None  Procedures : None  Consults: GI, nephrology  DVT Prophylaxis : SCDs Start: 01/14/20 0802   Subjective: Doing well-denies any melena overnight. No chest pain or shortness of breath.  Assessment/Plan: Acute hypoxic respiratory failure secondary to pulmonary edema in the setting of ESRD: Required BiPAP on initial presentation-improved after HD.  Currently on 2 L of oxygen-titrate off oxygen as tolerated.  GI bleeding (history of small bowel AVMs) with acute blood loss anemia: Hemoglobin 5.4 on admission-given a total of 3 units of PRBC this admission so far-hemoglobin stable at 8.4 this morning. No further episodes of melena overnight. GI following-with plans for endoscopic evaluation on 7/9. Remains on PPI infusion.  ESRD: On HD TTS-nephrology following and directing care  Hyperkalemia: Resolved post HD  PAF: Remains rate controlled with metoprolol-not a candidate for anticoagulation given ongoing GI bleeding.   PAD s/p left femoropopliteal bypass  2019: Remains stable-outpatient follow-up with vascular surgery.  HTN: Controlled-continue amlodipine/metoprolol-may need to use a more selective beta-blocker given history of cocaine use.  Thrombocytopenia: Appears mild-chronic issue-stable for outpatient follow-up  Polysubstance abuse: Acknowledges tobacco, alcohol (claims only social drinker) and cocaine (snorts) use (claims infrequent-"once in a while").  Counseled-not sure if he has any intention of quitting at this point.  Diet: Diet Order            Diet NPO time specified  Diet effective now           Diet clear liquid Room service appropriate? Yes; Fluid consistency: Thin  Diet effective now                  Code Status: Full code   Family Communication: None at bedside  Disposition Plan: Status is: Inpatient  Remains inpatient appropriate because:Inpatient level of care appropriate due to severity of illness   Dispo: The patient is from: Home              Anticipated d/c is to: Home              Anticipated d/c date is: 3 days              Patient currently is not medically stable to d/c.  Barriers to Discharge: GI bleeding with acute blood loss anemia-requiring endoscopic evaluation-tentatively scheduled for 7/9.[name]  Antimicrobial agents: Anti-infectives (From admission, onward)   None       Time spent: 25 minutes-Greater than 50% of this time was spent in counseling, explanation of diagnosis, planning of further management, and coordination  of care.  MEDICATIONS: Scheduled Meds:  amLODipine  12.5 mg Oral Daily   Chlorhexidine Gluconate Cloth  6 each Topical Q0600   darbepoetin (ARANESP) injection - DIALYSIS  200 mcg Intravenous Q Thu-HD   metoprolol succinate  50 mg Oral BID   nicotine  21 mg Transdermal Daily   [START ON 01/17/2020] pantoprazole  40 mg Intravenous Q12H   QUEtiapine  100 mg Oral QHS   sevelamer carbonate  4,800 mg Oral TID WC   sodium chloride flush  3 mL Intravenous Q12H    Continuous Infusions:  sodium chloride     sodium chloride     pantoprozole (PROTONIX) infusion 8 mg/hr (01/16/20 0327)   PRN Meds:.sodium chloride, sodium chloride, acetaminophen **OR** acetaminophen, alteplase, bismuth subsalicylate, heparin, hydrALAZINE, ipratropium-albuterol, lidocaine (PF), lidocaine-prilocaine, metoprolol tartrate, ondansetron **OR** ondansetron (ZOFRAN) IV, pentafluoroprop-tetrafluoroeth   PHYSICAL EXAM: Vital signs: Vitals:   01/16/20 0733 01/16/20 0800 01/16/20 0830 01/16/20 0900  BP: 137/90 (!) 145/90 129/79 (!) 102/51  Pulse: 72 86 80 74  Resp:      Temp:      TempSrc:      SpO2:      Weight:       Filed Weights   01/16/20 0725  Weight: 57.7 kg   Body mass index is 19.34 kg/m.   Gen Exam:Alert awake-not in any distress HEENT:atraumatic, normocephalic Chest: B/L clear to auscultation anteriorly CVS:S1S2 regular Abdomen:soft non tender, non distended Extremities:no edema Neurology: Non focal Skin: no rash  I have personally reviewed following labs and imaging studies  LABORATORY DATA: CBC: Recent Labs  Lab 01/14/20 0600 01/14/20 0607 01/14/20 0753 01/14/20 0753 01/14/20 1644 01/15/20 0317 01/15/20 1435 01/15/20 2046 01/16/20 0341  WBC 5.9  --  3.1*  --   --  1.6*  --  2.9* 2.4*  NEUTROABS 3.0  --   --   --   --   --   --   --   --   HGB 5.4*   < > 5.6*   < > 7.0* 6.5* 8.0* 8.4* 8.0*  HCT 18.4*   < > 18.9*   < > 22.4* 21.0* 25.0* 26.5* 24.9*  MCV 108.2*  --  109.9*  --   --  97.7  --  96.0 97.3  PLT 140*  --  120*  --   --  123*  --  129* 128*   < > = values in this interval not displayed.    Basic Metabolic Panel: Recent Labs  Lab 01/14/20 0600 01/14/20 0607 01/14/20 0753 01/15/20 0317 01/16/20 0341  NA 140 139 140 136 134*  K 5.3* 5.1 5.5* 5.2* 5.1  CL 96* 98 96* 97* 96*  CO2 25  --  23 28 27   GLUCOSE 147* 145* 110* 85 84  BUN 125* 127* 125* 46* 56*  CREATININE 14.51* 15.10* 14.62* 7.49* 9.54*  CALCIUM 9.6   --  9.6 8.8* 9.2  PHOS  --   --  5.6*  --  5.0*    GFR: Estimated Creatinine Clearance: 6.2 mL/min (A) (by C-G formula based on SCr of 9.54 mg/dL (H)).  Liver Function Tests: Recent Labs  Lab 01/14/20 0753 01/15/20 0317 01/16/20 0341  AST  --  12*  --   ALT  --  13  --   ALKPHOS  --  45  --   BILITOT  --  0.7  --   PROT  --  6.2*  --   ALBUMIN 3.3* 2.9* 2.7*  No results for input(s): LIPASE, AMYLASE in the last 168 hours. No results for input(s): AMMONIA in the last 168 hours.  Coagulation Profile: No results for input(s): INR, PROTIME in the last 168 hours.  Cardiac Enzymes: No results for input(s): CKTOTAL, CKMB, CKMBINDEX, TROPONINI in the last 168 hours.  BNP (last 3 results) No results for input(s): PROBNP in the last 8760 hours.  Lipid Profile: No results for input(s): CHOL, HDL, LDLCALC, TRIG, CHOLHDL, LDLDIRECT in the last 72 hours.  Thyroid Function Tests: No results for input(s): TSH, T4TOTAL, FREET4, T3FREE, THYROIDAB in the last 72 hours.  Anemia Panel: Recent Labs    01/15/20 0317  VITAMINB12 608  FOLATE 8.7  FERRITIN 160  TIBC 316  IRON 123    Urine analysis:    Component Value Date/Time   COLORURINE YELLOW 05/08/2010 [place] 05/08/2010 1335   LABSPEC 1.009 05/08/2010 1335   PHURINE 6.0 05/08/2010 1335   GLUCOSEU NEGATIVE 05/08/2010 1335   HGBUR NEGATIVE 05/08/2010 1335   BILIRUBINUR NEGATIVE 05/08/2010 1335   KETONESUR NEGATIVE 05/08/2010 1335   PROTEINUR 100 (A) 05/08/2010 1335   UROBILINOGEN 0.2 05/08/2010 1335   NITRITE NEGATIVE 05/08/2010 1335   LEUKOCYTESUR SMALL (A) 05/08/2010 1335    Sepsis Labs: Lactic Acid, Venous    Component Value Date/Time   LATICACIDVEN 1.2 05/28/2019 1820    MICROBIOLOGY: Recent Results (from the past 240 hour(s))  SARS Coronavirus 2 by RT PCR (hospital order, performed in [facility] hospital lab) Nasopharyngeal Nasopharyngeal Swab     Status: None   Collection Time: 01/14/20   5:58 AM   Specimen: Nasopharyngeal Swab  Result Value Ref Range Status   SARS Coronavirus 2 NEGATIVE NEGATIVE Final    Comment: (NOTE) SARS-CoV-2 target nucleic acids are NOT DETECTED.  The SARS-CoV-2 RNA is generally detectable in upper and lower respiratory specimens during the acute phase of infection. The lowest concentration of SARS-CoV-2 viral copies this assay can detect is 250 copies / mL. A negative result does not preclude SARS-CoV-2 infection and should not be used as the sole basis for treatment or other patient management decisions.  A negative result may occur with improper specimen collection / handling, submission of specimen other than nasopharyngeal swab, presence of viral mutation(s) within the areas targeted by this assay, and inadequate number of viral copies (<250 copies / mL). A negative result must be combined with clinical observations, patient history, and epidemiological information.  Fact Sheet for Patients:   StrictlyIdeas.no  Fact Sheet for Healthcare Providers: BankingDealers.co.za  This test is not yet approved or  cleared by the [place] FDA and has been authorized for detection and/or diagnosis of SARS-CoV-2 by FDA under an Emergency Use Authorization (EUA).  This EUA will remain in effect (meaning this test can be used) for the duration of the COVID-19 declaration under Section 564(b)(1) of the Act, 21 U.S.C. section 360bbb-3(b)(1), unless the authorization is terminated or revoked sooner.  Performed at [facility], [place] [address], [place], [place] [address]   MRSA PCR Screening     Status: None   Collection Time: 01/14/20  6:52 PM   Specimen: Nasal Mucosa; Nasopharyngeal  Result Value Ref Range Status   MRSA by PCR NEGATIVE NEGATIVE Final    Comment:        The GeneXpert MRSA Assay (FDA approved for NASAL specimens only), is one component of a comprehensive MRSA  colonization surveillance program. It is not intended to diagnose MRSA infection nor  to guide or monitor treatment for MRSA infections. Performed at [facility], [place] [address], [place], [place] [address]     RADIOLOGY STUDIES/RESULTS: CT Angio Abd/Pel w/ and/or w/o  Result Date: 01/15/2020 CLINICAL DATA:  64 year old male with GI bleed. EXAM: CTA ABDOMEN AND PELVIS WITHOUT AND WITH CONTRAST TECHNIQUE: Multidetector CT imaging of the abdomen and pelvis was performed using the standard protocol during bolus administration of intravenous contrast. Multiplanar reconstructed images and MIPs were obtained and reviewed to evaluate the vascular anatomy. CONTRAST:  152mL OMNIPAQUE IOHEXOL 350 MG/ML SOLN COMPARISON:  CT abdomen pelvis dated 12/24/2018. FINDINGS: VASCULAR Aorta: Advanced atherosclerotic calcification of the abdominal aorta. There is a 2.7 cm infrarenal aortic ectasia. No aneurysmal dilatation or dissection. No periaortic fluid. Celiac: Mild focal narrowing of the origin of the celiac artery. The celiac artery and its major branches are patent. SMA: There is atherosclerotic calcification of the origin of the SMA. The SMA is patent. There is replaced hepatic artery arising from the SMA. Renals: Atherosclerotic calcification of the renal artery ostia. There is diminished perfusion within the renal arteries. IMA: Patent without evidence of aneurysm, dissection, vasculitis or significant stenosis. Inflow: There is atherosclerotic calcification of the iliac arteries. The iliac arteries remain patent. Proximal Outflow: Advanced atherosclerotic calcification of the visualized femoral arteries. There is complete occlusion of the visualized proximal portion of the superficial femoral arteries. Veins: The SMV, splenic vein, and main portal vein are patent. The IVC is unremarkable. No portal venous gas. Review of the MIP images confirms the above findings. NON-VASCULAR Lower chest: Partially visualized  small bilateral pleural effusion and bibasilar subsegmental atelectasis. Pneumonia is not excluded. Clinical correlation is recommended. There is cardiomegaly and coronary vascular calcification. No intra-abdominal free air or free fluid. Hepatobiliary: The liver is unremarkable. There is mild intrahepatic biliary ductal dilatation versus mild periportal edema. Correlation with LFTs recommended. There is diffuse thickened appearance of the gallbladder wall with possible small pericholecystic fluid. Findings may be related to underlying liver disease although acute cholecystitis is not excluded. Correlation with clinical exam and further evaluation with ultrasound recommended. No calcified gallstone. Pancreas: Unremarkable. No pancreatic ductal dilatation or surrounding inflammatory changes. Spleen: Normal in size without focal abnormality. Adrenals/Urinary Tract: The adrenal glands are unremarkable. Moderate bilateral renal parenchyma atrophy indeterminate bilateral hypodense lesions. The larger lesions demonstrate fluid attenuation consistent with cysts. The smaller lesions are too small to characterize. There is no hydronephrosis on either side. The visualized ureters appear unremarkable. The urinary bladder is collapsed. Stomach/Bowel: Several small scattered sigmoid diverticula without active inflammatory changes. There is no bowel obstruction or active inflammation. Normal appendix. No evidence of active GI bleed. Lymphatic: No adenopathy. Reproductive: The prostate and seminal vesicles are grossly unremarkable. Other: Small fat containing umbilical hernia. Musculoskeletal: Osteopenia with degenerative changes of the spine. No acute osseous pathology. IMPRESSION: 1. No CT evidence of active GI bleed. 2. Thickened appearance of the gallbladder wall with possible small pericholecystic fluid. Findings may be related to underlying liver disease although acute cholecystitis is not excluded. Correlation with clinical  exam and further evaluation with ultrasound recommended. 3. Small scattered sigmoid diverticula. No bowel obstruction. Normal appendix. 4. Cardiomegaly with coronary vascular calcification. 5. Partially visualized small bilateral pleural effusions and bibasilar subsegmental atelectasis. Pneumonia is not excluded. Clinical correlation is recommended. 6. Aortic Atherosclerosis (ICD10-I70.0). Electronically Signed   By: Anner Crete M.D.   On: 01/15/2020 18:43     LOS: 2 days   Oren Binet, MD  [facility]    To contact the attending provider between 7A-7P or the covering provider during after hours 7P-7A, please log into the web site [email or web address] and access using universal [place] password for that web site. If you do not have the password, please call the hospital operator.  01/16/2020, 9:30 AM

## 2020-01-17 ENCOUNTER — Encounter (HOSPITAL_COMMUNITY): Payer: Self-pay | Admitting: Internal Medicine

## 2020-01-17 ENCOUNTER — Inpatient Hospital Stay (HOSPITAL_COMMUNITY): Payer: Medicare Other | Admitting: Anesthesiology

## 2020-01-17 ENCOUNTER — Encounter (HOSPITAL_COMMUNITY)
Admission: EM | Disposition: A | Discharge status: Home / Self Care | Payer: Self-pay | Source: Home / Self Care | Attending: Internal Medicine

## 2020-01-17 HISTORY — PX: ENTEROSCOPY: SHX5533

## 2020-01-17 HISTORY — PX: HOT HEMOSTASIS: SHX5433

## 2020-01-17 HISTORY — PX: BIOPSY: SHX5522

## 2020-01-17 LAB — RENAL FUNCTION PANEL
Albumin: 2.7 g/dL — ABNORMAL LOW (ref 3.5–5.0)
Anion gap: 8 (ref 5–15)
BUN: 23 mg/dL (ref 8–23)
CO2: 30 mmol/L (ref 22–32)
Calcium: 8.7 mg/dL — ABNORMAL LOW (ref 8.9–10.3)
Chloride: 95 mmol/L — ABNORMAL LOW (ref 98–111)
Creatinine, Ser: 5.89 mg/dL — ABNORMAL HIGH (ref 0.61–1.24)
GFR calc Af Amer: 11 mL/min — ABNORMAL LOW
GFR calc non Af Amer: 9 mL/min — ABNORMAL LOW
Glucose, Bld: 78 mg/dL (ref 70–99)
Phosphorus: 4.3 mg/dL (ref 2.5–4.6)
Potassium: 4 mmol/L (ref 3.5–5.1)
Sodium: 133 mmol/L — ABNORMAL LOW (ref 135–145)

## 2020-01-17 LAB — CBC
HCT: 26.1 % — ABNORMAL LOW (ref 39.0–52.0)
Hemoglobin: 8.1 g/dL — ABNORMAL LOW (ref 13.0–17.0)
MCH: 30.1 pg (ref 26.0–34.0)
MCHC: 31 g/dL (ref 30.0–36.0)
MCV: 97 fL (ref 80.0–100.0)
Platelets: 137 K/uL — ABNORMAL LOW (ref 150–400)
RBC: 2.69 MIL/uL — ABNORMAL LOW (ref 4.22–5.81)
RDW: 18.8 % — ABNORMAL HIGH (ref 11.5–15.5)
WBC: 2.5 K/uL — ABNORMAL LOW (ref 4.0–10.5)
nRBC: 0 % (ref 0.0–0.2)

## 2020-01-17 SURGERY — ENTEROSCOPY
Anesthesia: Monitor Anesthesia Care

## 2020-01-17 MED ORDER — LIDOCAINE HCL (CARDIAC) PF 100 MG/5ML IV SOSY
PREFILLED_SYRINGE | INTRAVENOUS | Status: DC | PRN
  Administered 2020-01-17: 40 mg via INTRAVENOUS

## 2020-01-17 MED ORDER — GLUCAGON HCL RDNA (DIAGNOSTIC) 1 MG IJ SOLR
INTRAMUSCULAR | Status: DC | PRN
  Administered 2020-01-17 (×3): .2 mg via INTRAVENOUS

## 2020-01-17 MED ORDER — SODIUM CHLORIDE 0.9 % IV SOLN: 100.0000 mL | INTRAVENOUS | Status: DC | PRN

## 2020-01-17 MED ORDER — PHENYLEPHRINE HCL-NACL 10-0.9 MG/250ML-% IV SOLN
INTRAVENOUS | Status: DC | PRN
Start: 2020-01-17 — End: 2020-01-17
  Administered 2020-01-17: 60 ug/min via INTRAVENOUS

## 2020-01-17 MED ORDER — SODIUM CHLORIDE 0.9 % IV SOLN: INTRAVENOUS | Status: DC

## 2020-01-17 MED ORDER — GLUCAGON HCL RDNA (DIAGNOSTIC) 1 MG IJ SOLR
INTRAMUSCULAR | Status: AC
  Filled 2020-01-17: qty 1

## 2020-01-17 MED ORDER — PROPOFOL 500 MG/50ML IV EMUL
INTRAVENOUS | Status: DC | PRN
  Administered 2020-01-17: 100 ug/kg/min via INTRAVENOUS

## 2020-01-17 MED ORDER — PROPOFOL 10 MG/ML IV BOLUS
INTRAVENOUS | Status: DC | PRN
  Administered 2020-01-17: 40 mg via INTRAVENOUS

## 2020-01-17 MED ORDER — HEPARIN SODIUM (PORCINE) 1000 UNIT/ML DIALYSIS
1000.0000 [IU] | INTRAMUSCULAR | Status: DC | PRN
  Filled 2020-01-17: qty 1

## 2020-01-17 MED ORDER — PHENYLEPHRINE 40 MCG/ML (10ML) SYRINGE FOR IV PUSH (FOR BLOOD PRESSURE SUPPORT)
PREFILLED_SYRINGE | INTRAVENOUS | Status: DC | PRN
  Administered 2020-01-17: 80 ug via INTRAVENOUS
  Administered 2020-01-17: 200 ug via INTRAVENOUS
  Administered 2020-01-17 (×2): 80 ug via INTRAVENOUS

## 2020-01-17 MED ORDER — CHLORHEXIDINE GLUCONATE CLOTH 2 % EX PADS
6.0000 | MEDICATED_PAD | Freq: Every day | CUTANEOUS | Status: DC
  Administered 2020-01-18: 6 via TOPICAL

## 2020-01-17 NOTE — Progress Notes (Addendum)
PROGRESS NOTE        PATIENT DETAILS Name: Raymond [name] Age: 64 y.o. Sex: male Date of Birth: 1955-11-24 Admit Date: 01/14/2020 Admitting Physician Norval Morton, MD IRW:ERXVQ, Malva Limes., FNP  Brief Narrative: Patient is a 64 y.o. male with history of ESRD on HD TTS, CAD, PAF not on anticoagulation, PAD s/p femoropopliteal bypass in 2019, COPD, hepatitis C s/p interferon treatment, ongoing cocaine abuse, history of intestinal AVMs-who presented with shortness of breath-found to have acute hypoxic respiratory failure due to pulmonary edema requiring BiPAP support and severe acute blood loss anemia due to upper GI bleeding.  Significant events: 7/6>> admit to [facility]-severe hypoxia from pulmonary edema-and acute blood loss anemia from GI bleeding.  Significant studies: 7/6>> chest x-ray: Findings consistent with CHF. 7/7>> CTA abdomen/pelvis: No evidence of GI bleeding 04/26/2018>> echo: EF 60-65%  Antimicrobial therapy: None  Microbiology data: None  Procedures : None  Consults: GI, nephrology  DVT Prophylaxis : SCDs Start: 01/14/20 0802   Subjective: Claims he had "dark-colored stool" yesterday.  But none overnight and this morning.  Lying comfortably in bed-on room air.  Assessment/Plan: Acute hypoxic respiratory failure secondary to pulmonary edema in the setting of ESRD: Required BiPAP on initial presentation-improved after HD.  Titrated to room air-continue supportive care.  GI bleeding (history of small bowel AVMs) with acute blood loss anemia: Hemoglobin 5.4 on admission-given a total of 3 units of PRBC this admission so far-hemoglobin stable at 8.1 this morning.  Claims he had a dark-colored stool yesterday evening-but not overnight.  Remains on PPI-GI planning endoscopic evaluation today.  Will await further recommendations.   ESRD: On HD TTS-nephrology following and directing care  Hyperkalemia: Resolved post HD  PAF: Remains rate  controlled with metoprolol-not a candidate for anticoagulation given ongoing GI bleeding.   PAD s/p left femoropopliteal bypass 2019: Remains stable-outpatient follow-up with vascular surgery.  HTN: Controlled-continue amlodipine/metoprolol-may need to use a more selective beta-blocker given history of cocaine use.  Thrombocytopenia: Appears mild-chronic issue-stable for outpatient follow-up  Polysubstance abuse: Acknowledges tobacco, alcohol (claims only social drinker) and cocaine (snorts) use (claims infrequent-"once in a while").  Counseled-not sure if he has any intention of quitting at this point.  Diet: Diet Order            Diet NPO time specified  Diet effective midnight                  Code Status: Full code   Family Communication: None at bedside  Disposition Plan: Status is: Inpatient  Remains inpatient appropriate because:Inpatient level of care appropriate due to severity of illness   Dispo: The patient is from: Home              Anticipated d/c is to: Home              Anticipated d/c date is: 3 days              Patient currently is not medically stable to d/c.  Barriers to Discharge: GI bleeding with acute blood loss anemia-requiring endoscopic evaluation-scheduled for endoscopic evaluation later today.  Antimicrobial agents: Anti-infectives (From admission, onward)   None       Time spent: 25 minutes-Greater than 50% of this time was spent in counseling, explanation of diagnosis, planning of further management, and coordination of care.  MEDICATIONS: Scheduled Meds: . [MAR Hold] amLODipine  12.5 mg Oral Daily  . [MAR Hold] Chlorhexidine Gluconate Cloth  6 each Topical Q0600  . [MAR Hold] darbepoetin (ARANESP) injection - DIALYSIS  200 mcg Intravenous Q Thu-HD  . [MAR Hold] metoprolol succinate  50 mg Oral BID  . [MAR Hold] nicotine  21 mg Transdermal Daily  . [MAR Hold] pantoprazole  40 mg Intravenous Q12H  . [MAR Hold] QUEtiapine  100 mg  Oral QHS  . [MAR Hold] sevelamer carbonate  4,800 mg Oral TID WC  . [MAR Hold] sodium chloride flush  3 mL Intravenous Q12H   Continuous Infusions: . [MAR Hold] sodium chloride    . [MAR Hold] sodium chloride    . sodium chloride 20 mL/hr at 01/17/20 1059   PRN Meds:.[MAR Hold] sodium chloride, [MAR Hold] sodium chloride, [MAR Hold] acetaminophen **OR** [MAR Hold] acetaminophen, [MAR Hold] alteplase, [MAR Hold] bismuth subsalicylate, [MAR Hold] heparin, [MAR Hold] hydrALAZINE, [MAR Hold] ipratropium-albuterol, [MAR Hold] lidocaine (PF), [MAR Hold] lidocaine-prilocaine, [MAR Hold] metoprolol tartrate, [MAR Hold] ondansetron **OR** [MAR Hold] ondansetron (ZOFRAN) IV, [MAR Hold] pentafluoroprop-tetrafluoroeth   PHYSICAL EXAM: Vital signs: Vitals:   01/16/20 2353 01/17/20 0351 01/17/20 0720 01/17/20 0955  BP:   123/75 (!) 169/104  Pulse:   90 83  Resp: 14  18 (!) 25  Temp:  98.4 F (36.9 C) 97.9 F (36.6 C)   TempSrc:  Oral Oral   SpO2:   93% 100%  Weight:    57.7 kg  Height:    5\' 8"  (1.727 m)   Filed Weights   01/16/20 0725 01/17/20 0955  Weight: 57.7 kg 57.7 kg   Body mass index is 19.34 kg/m.   Gen Exam:Alert awake-not in any distress HEENT:atraumatic, normocephalic Chest: B/L clear to auscultation anteriorly CVS:S1S2 regular Abdomen:soft non tender, non distended Extremities:no edema Neurology: Non focal Skin: no rash  I have personally reviewed following labs and imaging studies  LABORATORY DATA: CBC: Recent Labs  Lab 01/14/20 0600 01/14/20 0607 01/14/20 0753 01/14/20 1644 01/15/20 0317 01/15/20 1435 01/15/20 2046 01/16/20 0341 01/17/20 0444  WBC 5.9   < > 3.1*  --  1.6*  --  2.9* 2.4* 2.5*  NEUTROABS 3.0  --   --   --   --   --   --   --   --   HGB 5.4*   < > 5.6*   < > 6.5* 8.0* 8.4* 8.0* 8.1*  HCT 18.4*   < > 18.9*   < > 21.0* 25.0* 26.5* 24.9* 26.1*  MCV 108.2*   < > 109.9*  --  97.7  --  96.0 97.3 97.0  PLT 140*   < > 120*  --  123*  --  129* 128*  137*   < > = values in this interval not displayed.    Basic Metabolic Panel: Recent Labs  Lab 01/14/20 0600 01/14/20 0600 01/14/20 0607 01/14/20 0753 01/15/20 0317 01/16/20 0341 01/17/20 0444  NA 140   < > 139 140 136 134* 133*  K 5.3*   < > 5.1 5.5* 5.2* 5.1 4.0  CL 96*   < > 98 96* 97* 96* 95*  CO2 25  --   --  23 28 27 30   GLUCOSE 147*   < > 145* 110* 85 84 78  BUN 125*   < > 127* 125* 46* 56* 23  CREATININE 14.51*   < > 15.10* 14.62* 7.49* 9.54* 5.89*  CALCIUM 9.6  --   --  9.6 8.8* 9.2 8.7*  PHOS  --   --   --  5.6*  --  5.0* 4.3   < > = values in this interval not displayed.    GFR: Estimated Creatinine Clearance: 10.1 mL/min (A) (by C-G formula based on SCr of 5.89 mg/dL (H)).  Liver Function Tests: Recent Labs  Lab 01/14/20 0753 01/15/20 0317 01/16/20 0341 01/17/20 0444  AST  --  12*  --   --   ALT  --  13  --   --   ALKPHOS  --  45  --   --   BILITOT  --  0.7  --   --   PROT  --  6.2*  --   --   ALBUMIN 3.3* 2.9* 2.7* 2.7*   No results for input(s): LIPASE, AMYLASE in the last 168 hours. No results for input(s): AMMONIA in the last 168 hours.  Coagulation Profile: No results for input(s): INR, PROTIME in the last 168 hours.  Cardiac Enzymes: No results for input(s): CKTOTAL, CKMB, CKMBINDEX, TROPONINI in the last 168 hours.  BNP (last 3 results) No results for input(s): PROBNP in the last 8760 hours.  Lipid Profile: No results for input(s): CHOL, HDL, LDLCALC, TRIG, CHOLHDL, LDLDIRECT in the last 72 hours.  Thyroid Function Tests: No results for input(s): TSH, T4TOTAL, FREET4, T3FREE, THYROIDAB in the last 72 hours.  Anemia Panel: Recent Labs    01/15/20 0317  VITAMINB12 608  FOLATE 8.7  FERRITIN 160  TIBC 316  IRON 123    Urine analysis:    Component Value Date/Time   COLORURINE YELLOW 05/08/2010 [place] 05/08/2010 1335   LABSPEC 1.009 05/08/2010 1335   PHURINE 6.0 05/08/2010 1335   GLUCOSEU NEGATIVE  05/08/2010 1335   HGBUR NEGATIVE 05/08/2010 1335   BILIRUBINUR NEGATIVE 05/08/2010 1335   KETONESUR NEGATIVE 05/08/2010 1335   PROTEINUR 100 (A) 05/08/2010 1335   UROBILINOGEN 0.2 05/08/2010 1335   NITRITE NEGATIVE 05/08/2010 1335   LEUKOCYTESUR SMALL (A) 05/08/2010 1335    Sepsis Labs: Lactic Acid, Venous    Component Value Date/Time   LATICACIDVEN 1.2 05/28/2019 1820    MICROBIOLOGY: Recent Results (from the past 240 hour(s))  SARS Coronavirus 2 by RT PCR (hospital order, performed in [facility] hospital lab) Nasopharyngeal Nasopharyngeal Swab     Status: None   Collection Time: 01/14/20  5:58 AM   Specimen: Nasopharyngeal Swab  Result Value Ref Range Status   SARS Coronavirus 2 NEGATIVE NEGATIVE Final    Comment: (NOTE) SARS-CoV-2 target nucleic acids are NOT DETECTED.  The SARS-CoV-2 RNA is generally detectable in upper and lower respiratory specimens during the acute phase of infection. The lowest concentration of SARS-CoV-2 viral copies this assay can detect is 250 copies / mL. A negative result does not preclude SARS-CoV-2 infection and should not be used as the sole basis for treatment or other patient management decisions.  A negative result may occur with improper specimen collection / handling, submission of specimen other than nasopharyngeal swab, presence of viral mutation(s) within the areas targeted by this assay, and inadequate number of viral copies (<250 copies / mL). A negative result must be combined with clinical observations, patient history, and epidemiological information.  Fact Sheet for Patients:   StrictlyIdeas.no  Fact Sheet for Healthcare Providers: BankingDealers.co.za  This test is not yet approved or  cleared by the [place] FDA and has been authorized for detection and/or diagnosis of SARS-CoV-2 by FDA under an  Emergency Use Authorization (EUA).  This EUA will remain in effect (meaning  this test can be used) for the duration of the COVID-19 declaration under Section 564(b)(1) of the Act, 21 U.S.C. section 360bbb-3(b)(1), unless the authorization is terminated or revoked sooner.  Performed at [facility], [place] [address], [place], [place] [address]   MRSA PCR Screening     Status: None   Collection Time: 01/14/20  6:52 PM   Specimen: Nasal Mucosa; Nasopharyngeal  Result Value Ref Range Status   MRSA by PCR NEGATIVE NEGATIVE Final    Comment:        The GeneXpert MRSA Assay (FDA approved for NASAL specimens only), is one component of a comprehensive MRSA colonization surveillance program. It is not intended to diagnose MRSA infection nor to guide or monitor treatment for MRSA infections. Performed at [facility], [place] [address] [place], [place] [address]     RADIOLOGY STUDIES/RESULTS: CT Angio Abd/Pel w/ and/or w/o  Result Date: 01/15/2020 CLINICAL DATA:  64 year old male with GI bleed. EXAM: CTA ABDOMEN AND PELVIS WITHOUT AND WITH CONTRAST TECHNIQUE: Multidetector CT imaging of the abdomen and pelvis was performed using the standard protocol during bolus administration of intravenous contrast. Multiplanar reconstructed images and MIPs were obtained and reviewed to evaluate the vascular anatomy. CONTRAST:  123mL OMNIPAQUE IOHEXOL 350 MG/ML SOLN COMPARISON:  CT abdomen pelvis dated 12/24/2018. FINDINGS: VASCULAR Aorta: Advanced atherosclerotic calcification of the abdominal aorta. There is a 2.7 cm infrarenal aortic ectasia. No aneurysmal dilatation or dissection. No periaortic fluid. Celiac: Mild focal narrowing of the origin of the celiac artery. The celiac artery and its major branches are patent. SMA: There is atherosclerotic calcification of the origin of the SMA. The SMA is patent. There is replaced hepatic artery arising from the SMA. Renals: Atherosclerotic calcification of the renal artery ostia. There is diminished perfusion within the renal  arteries. IMA: Patent without evidence of aneurysm, dissection, vasculitis or significant stenosis. Inflow: There is atherosclerotic calcification of the iliac arteries. The iliac arteries remain patent. Proximal Outflow: Advanced atherosclerotic calcification of the visualized femoral arteries. There is complete occlusion of the visualized proximal portion of the superficial femoral arteries. Veins: The SMV, splenic vein, and main portal vein are patent. The IVC is unremarkable. No portal venous gas. Review of the MIP images confirms the above findings. NON-VASCULAR Lower chest: Partially visualized small bilateral pleural effusion and bibasilar subsegmental atelectasis. Pneumonia is not excluded. Clinical correlation is recommended. There is cardiomegaly and coronary vascular calcification. No intra-abdominal free air or free fluid. Hepatobiliary: The liver is unremarkable. There is mild intrahepatic biliary ductal dilatation versus mild periportal edema. Correlation with LFTs recommended. There is diffuse thickened appearance of the gallbladder wall with possible small pericholecystic fluid. Findings may be related to underlying liver disease although acute cholecystitis is not excluded. Correlation with clinical exam and further evaluation with ultrasound recommended. No calcified gallstone. Pancreas: Unremarkable. No pancreatic ductal dilatation or surrounding inflammatory changes. Spleen: Normal in size without focal abnormality. Adrenals/Urinary Tract: The adrenal glands are unremarkable. Moderate bilateral renal parenchyma atrophy indeterminate bilateral hypodense lesions. The larger lesions demonstrate fluid attenuation consistent with cysts. The smaller lesions are too small to characterize. There is no hydronephrosis on either side. The visualized ureters appear unremarkable. The urinary bladder is collapsed. Stomach/Bowel: Several small scattered sigmoid diverticula without active inflammatory changes.  There is no bowel obstruction or active inflammation. Normal appendix. No evidence of active GI bleed. Lymphatic: No adenopathy. Reproductive: The prostate  and seminal vesicles are grossly unremarkable. Other: Small fat containing umbilical hernia. Musculoskeletal: Osteopenia with degenerative changes of the spine. No acute osseous pathology. IMPRESSION: 1. No CT evidence of active GI bleed. 2. Thickened appearance of the gallbladder wall with possible small pericholecystic fluid. Findings may be related to underlying liver disease although acute cholecystitis is not excluded. Correlation with clinical exam and further evaluation with ultrasound recommended. 3. Small scattered sigmoid diverticula. No bowel obstruction. Normal appendix. 4. Cardiomegaly with coronary vascular calcification. 5. Partially visualized small bilateral pleural effusions and bibasilar subsegmental atelectasis. Pneumonia is not excluded. Clinical correlation is recommended. 6. Aortic Atherosclerosis (ICD10-I70.0). Electronically Signed   By: Anner Crete M.D.   On: 01/15/2020 18:43     LOS: 3 days   Oren Binet, MD  [facility]    To contact the attending provider between 7A-7P or the covering provider during after hours 7P-7A, please log into the web site [email or web address] and access using universal [place] password for that web site. If you do not have the password, please call the hospital operator.  01/17/2020, 11:39 AM

## 2020-01-17 NOTE — TOC Initial Note (Signed)
Transition of Care [facility]) - Initial/Assessment Note    Patient Details  Name: Raymond [name] MRN: [id number] Date of Birth: 1955-09-01  Transition of [facility]) CM/SW Contact:    Angelita Ingles, RN Phone Number: [phone]  01/17/2020, 1:29 PM  Clinical Narrative:   Patient requesting to see CM . CM at bedside and patient asked CM to call his apartment to notify manager that patient is in the hospital. CM called Florence Canner at [phone]) with no answer and no option for voicemail. Will attempt to call again. Patient states that he lives in an apartment alone where he has no DME or Home Health services. Patient states that he was independent prior to this hospitalization and plans to go back to his apartment to live independently.Substance ause resources given. CM will continue to follow for any further needs.                   Expected Discharge Plan: Home/Self Care Barriers to Discharge: Continued Medical Work up   Patient Goals and CMS Choice Patient states their goals for this hospitalization and ongoing recovery are:: Wants to go home   Choice offered to / list presented to : NA  Expected Discharge Plan and Services Expected Discharge Plan: Home/Self Care In-house Referral: NA Discharge Planning Services: CM Consult Post Acute Care Choice: NA Living arrangements for the past 2 months: Apartment                 DME Arranged: N/A DME Agency: NA       HH Arranged: NA HH Agency: NA        Prior Living Arrangements/Services Living arrangements for the past 2 months: Apartment Lives with:: Self Patient language and need for interpreter reviewed:: Yes Do you feel safe going back to the place where you live?: Yes      Need for Family Participation in Patient Care: Yes (Comment) Care giver support system in place?: Yes (comment)   Criminal Activity/Legal Involvement Pertinent to Current Situation/Hospitalization: No - Comment as needed  Activities of Daily Living       Permission Sought/Granted   Permission granted to share information with : No (refused)              Emotional Assessment Appearance:: Appears stated age Attitude/Demeanor/Rapport: Engaged Affect (typically observed): Accepting Orientation: : Oriented to Self, Oriented to Place, Oriented to  Time Alcohol / Substance Use: Alcohol Use, Tobacco Use Psych Involvement: No (comment)  Admission diagnosis:  Melena [K92.1] Acute pulmonary edema (HCC) [J81.0] ESRD (end stage renal disease) ([place]) [N18.6] Hypertensive urgency [I16.0] UGIB (upper gastrointestinal bleed) [K92.2] Chronic obstructive pulmonary disease, unspecified COPD type ([place]) [J44.9] Patient Active Problem List   Diagnosis Date Noted  . History of arteriovenous malformation (AVM) 01/14/2020  . UGIB (upper gastrointestinal bleed) 01/14/2020  . Acute blood loss anemia 01/14/2020  . Elevated troponin 01/14/2020  . Gastroenteritis 12/24/2018  . Substance use disorder 12/24/2018  . Noncompliance of patient with renal dialysis ([place]) 09/12/2018  . Benign neoplasm of colon   . Melena   . Anemia due to chronic kidney disease, on chronic dialysis ([place])   . Acute on chronic anemia   . H/O medication noncompliance   . Polysubstance (excluding opioids) dependence ([place])   . Atrial flutter with rapid ventricular response ([place]) 08/10/2018  . PAF (paroxysmal atrial fibrillation) ([place]) 08/01/2018  . Acute respiratory failure with hypoxia ([place]) 07/13/2018  . Non-compliance with renal dialysis ([place]) 07/13/2018  . PAD (peripheral  artery disease) ([place]) 04/11/2018  . Sepsis ([place]) 03/31/2018  . Preop cardiovascular exam 03/29/2018  . Volume overload 08/07/2017  . Acute pulmonary edema (HCC)   . Dyspnea 06/20/2017  . Acute bronchitis   . COPD (chronic obstructive pulmonary disease) ([place])   . End-stage renal disease on hemodialysis ([place])   . Hypoxia 12/05/2016  . Hyperkalemia 12/19/2015  . Hypoglycemia 12/19/2015  .  Polysubstance abuse ([place]) 12/19/2015  . Homeless 12/19/2015  . Anemia associated with stage 5 chronic renal failure ([place]) 12/19/2015  . Atypical chest pain 12/19/2013  . Atherosclerosis of native arteries of extremity with intermittent claudication ([place]) 12/04/2012  . ESRD (end stage renal disease) ([place]) 12/04/2012  . HEPATITIS C 09/07/2006  . HYPERCHOLESTEROLEMIA 09/07/2006  . HYPERTENSION, BENIGN SYSTEMIC 09/07/2006   PCP:  [name], FNP Pharmacy:   [facility] [place], [place] [facility] [address] Phone: [phone] Fax: [phone]     Social Determinants of Health (SDOH) Interventions    Readmission Risk Interventions No flowsheet data found.

## 2020-01-17 NOTE — Transfer of Care (Signed)
Immediate Anesthesia Transfer of Care Note  Patient: Raymond [name]  Procedure(s) Performed: ENTEROSCOPY (N/A ) HOT HEMOSTASIS (ARGON PLASMA COAGULATION/BICAP) (N/A )  Patient Location: Endoscopy Unit  Anesthesia Type:MAC  Level of Consciousness: drowsy and patient cooperative  Airway & Oxygen Therapy: Patient Spontanous Breathing and Patient connected to face mask oxygen  Post-op Assessment: Report given to RN and Post -op Vital signs reviewed and stable  Post vital signs: Reviewed and stable  Last Vitals:  Vitals Value Taken Time  BP 134/79 01/17/20 1140  Temp    Pulse 40 01/17/20 1144  Resp 19 01/17/20 1145  SpO2 100 % 01/17/20 1144  Vitals shown include unvalidated device data.  Last Pain:  Vitals:   01/17/20 0955  TempSrc:   PainSc: 0-No pain      Patients Stated Pain Goal: 0 (20/35/59 7416)  Complications: No complications documented.

## 2020-01-17 NOTE — Anesthesia Postprocedure Evaluation (Signed)
Anesthesia Post Note  Patient: Raymond [name]  Procedure(s) Performed: ENTEROSCOPY (N/A ) HOT HEMOSTASIS (ARGON PLASMA COAGULATION/BICAP) (N/A ) BIOPSY     Patient location during evaluation: Endoscopy Anesthesia Type: MAC Level of consciousness: awake and alert Pain management: pain level controlled Vital Signs Assessment: post-procedure vital signs reviewed and stable Respiratory status: spontaneous breathing, nonlabored ventilation, respiratory function stable and patient connected to nasal cannula oxygen Cardiovascular status: blood pressure returned to baseline and stable Postop Assessment: no apparent nausea or vomiting Anesthetic complications: no   No complications documented.  Last Vitals:  Vitals:   01/17/20 1200 01/17/20 1210  BP: 129/75 118/64  Pulse:    Resp: 15 17  Temp:    SpO2: 100% 100%    Last Pain:  Vitals:   01/17/20 1210  TempSrc:   PainSc: 0-No pain                 Barnet Glasgow

## 2020-01-17 NOTE — TOC CAGE-AID Note (Signed)
Transition of Care [facility]) - CAGE-AID Screening   Patient Details  Name: Raymond [name] MRN: [id number] Date of Birth: 1956-05-26  [facility]) CM/SW Contact:    Angelita Ingles, RN Phone Number: 01/17/2020, 1:26 PM   Clinical Narrative: CM at bedside to give patient resources for inpatient and outpatient rehab programs. Patient verbalized understanding and interest in getting help with his drinking issue.     CAGE-AID Screening:    Have You Ever Felt You Ought to Cut Down on Your Drinking or Drug Use?: Yes Have People Annoyed You By Critizing Your Drinking Or Drug Use?: Yes Have You Felt Bad Or Guilty About Your Drinking Or Drug Use?: Yes Have You Ever Had a Drink or Used Drugs First Thing In The Morning to STeady Your Nerves or to Get Rid of a Hangover?: Yes CAGE-AID Score: 4  Substance Abuse Education Offered: Yes  Substance abuse interventions: Scientist, clinical (histocompatibility and immunogenetics)

## 2020-01-17 NOTE — Interval H&P Note (Signed)
History and Physical Interval Note:  01/17/2020 10:59 AM  Raymond [name]  has presented today for surgery, with the diagnosis of GI bleed, anemia.  The various methods of treatment have been discussed with the patient and family. After consideration of risks, benefits and other options for treatment, the patient has consented to  Procedure(s): ENTEROSCOPY (N/A) as a surgical intervention.  The patient's history has been reviewed, patient examined, no change in status, stable for surgery.  I have reviewed the patient's chart and labs.  Questions were answered to the patient's satisfaction.     Jackquline Denmark

## 2020-01-17 NOTE — Op Note (Signed)
[facility] Patient Name: Raymond [name] Procedure Date : 01/17/2020 MRN: [id number] Attending MD: Jackquline Denmark , MD Date of Birth: [birth year] CSN: [id number] Age: 64 Admit Type: Inpatient Procedure:                Small bowel enteroscopy Indications:              GI bleeding source not documented by previous EGD                            and colonoscopy Providers:                Jackquline Denmark, MD, Clyde Lundborg, RN, Theodora Blow,                            Technician Referring MD:              Medicines:                Monitored Anesthesia Care Complications:            No immediate complications. Estimated Blood Loss:     Estimated blood loss: none. Procedure:                Pre-Anesthesia Assessment:                           - Prior to the procedure, a History and Physical                            was performed, and patient medications and                            allergies were reviewed. The patient's tolerance of                            previous anesthesia was also reviewed. The risks                            and benefits of the procedure and the sedation                            options and risks were discussed with the patient.                            All questions were answered, and informed consent                            was obtained. Prior Anticoagulants: The patient has                            taken no previous anticoagulant or antiplatelet                            agents. ASA Grade Assessment: III - A patient with  severe systemic disease. After reviewing the risks                            and benefits, the patient was deemed in                            satisfactory condition to undergo the procedure.                           After obtaining informed consent, the endoscope was                            passed under direct vision. Throughout the                            procedure, the patient's blood  pressure, pulse, and                            oxygen saturations were monitored continuously. The                            [id number]-[id number] ([id number]) Olympus pediatric colonscope                            was introduced through the mouth and advanced to                            the mid-jejunum. The small bowel enteroscopy was                            accomplished without difficulty. The patient                            tolerated the procedure well. Scope In: Scope Out: Findings:      The examined esophagus was normal.      A single 2 mm angiodysplastic lesion with no bleeding was found in the       gastric body. Coagulation for hemostasis using argon beam was successful.      Localized nodular mucosa was found in the first portion of the duodenum.       Biopsies were taken with a cold forceps for histology.      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 Coagulation for       hemostasis using argon plasma was successful. No active bleeding.      Glucagon was given during withdrawal. Impression:               -Gastric AVM s/p APC                           -Nodular duodenal mucosa in the first portion of                            the duodenum (biopsied)                           -  Duodenal/jejunal AVMs s/p APC ablation. Recommendation:           - Return patient to hospital ward for ongoing care.                           - Resume previous diet.                           - Trend CBC.                           - Avoid nonsteroidals.                           - Capsule endoscopy was not performed (since                            biopsies were obtained and patient was not                            prepped). Would recommend capsule endoscopy as                            outpatient if he continues to be anemic/has melena                            requiring blood transfusions.                            - The findings and recommendations were discussed                            with the patient. He did not want me to call his                            family. Procedure Code(s):        --- Professional ---                           [phone], 32, Small intestinal endoscopy, enteroscopy                            beyond second portion of duodenum, not including                            ileum; with control of bleeding (eg, injection,                            bipolar cautery, unipolar cautery, laser, heater                            probe, stapler, plasma coagulator)                           [id number], Small intestinal endoscopy, enteroscopy  beyond second portion of duodenum, not including                            ileum; with biopsy, single or multiple Diagnosis Code(s):        --- Professional ---                           K31.819, Angiodysplasia of stomach and duodenum                            without bleeding                           K31.89, Other diseases of stomach and duodenum                           K92.2, Gastrointestinal hemorrhage, unspecified CPT copyright 2019 [employer]. All rights reserved. The codes documented in this report are preliminary and upon coder review may  be revised to meet current compliance requirements. Jackquline Denmark, MD 01/17/2020 11:47:12 AM This report has been signed electronically. Number of Addenda: 0

## 2020-01-17 NOTE — Progress Notes (Signed)
[facility] Progress Note  Subjective: 3.6 L off on HD yest  Vitals:   01/16/20 2350 01/16/20 2353 01/17/20 0351 01/17/20 0720  BP: 131/67   123/75  Pulse: 64   90  Resp: (!) 31 14  18   Temp: 99.3 F (37.4 C)  98.4 F (36.9 C) 97.9 F (36.6 C)  TempSrc: Oral  Oral Oral  SpO2: 100%   93%  Weight:        Exam: General: Lying in bed, RA , comfortable Heart: RRR Lungs: clear bilat to bases Abdomen: soft non-tender  Extremities: no sig LE edema  Dialysis Access: LUE AVF +bruit     OP HD: [facility] TTS  4h 450/800   68.5kg  2/2 bath P4  LUE AVF  Hep none  mircera 225 last 6.28  calc 1.5 ug    Lab Hb 10 on 6/24   Assessment/ Plan: 1. SBP/ acute resp distress: w/ pulm edema on CXR. Improved with volume removal on dialysis, on room air today. Resolved.  2. GI Bleed/ABLA:  FOBT+. Hgb 5.4 on admission > Hgb 8.0 s/p  2 u prbcs 7/6, 1 u 7/7.   On Protonix gtt. GI following -possible endoscopy 3. ESRD -HD TTS. Next HD Saturday.  4. Hypertension/volume - good UF yest 3.6 L on HD, 9 kg under will get stand wt 5. Anemia - As above. Tsat 39%. Did get Aranesp 200 with HD 7/8.  6. Metabolic bone disease -Corr Ca 10.2 Phos ok. Continue Renvela binder/Calcitriol. Follow trends  7. AFib - on metoprolol. No anticoag 2/2 GI bleed.      Raymond [name] 01/17/2020, 9:11 AM   Recent Labs  Lab 01/16/20 0341 01/17/20 0444  K 5.1 4.0  BUN 56* 23  CREATININE 9.54* 5.89*  CALCIUM 9.2 8.7*  PHOS 5.0* 4.3  HGB 8.0* 8.1*   Inpatient medications: . amLODipine  12.5 mg Oral Daily  . Chlorhexidine Gluconate Cloth  6 each Topical Q0600  . darbepoetin (ARANESP) injection - DIALYSIS  200 mcg Intravenous Q Thu-HD  . metoprolol succinate  50 mg Oral BID  . nicotine  21 mg Transdermal Daily  . pantoprazole  40 mg Intravenous Q12H  . QUEtiapine  100 mg Oral QHS  . sevelamer carbonate  4,800 mg Oral TID WC  . sodium chloride flush  3 mL Intravenous Q12H   . sodium chloride    .  sodium chloride     sodium chloride, sodium chloride, acetaminophen **OR** acetaminophen, alteplase, bismuth subsalicylate, heparin, hydrALAZINE, ipratropium-albuterol, lidocaine (PF), lidocaine-prilocaine, metoprolol tartrate, ondansetron **OR** ondansetron (ZOFRAN) IV, pentafluoroprop-tetrafluoroeth

## 2020-01-18 DIAGNOSIS — J449 Chronic obstructive pulmonary disease, unspecified: Secondary | ICD-10-CM

## 2020-01-18 LAB — RENAL FUNCTION PANEL
Albumin: 2.5 g/dL — ABNORMAL LOW (ref 3.5–5.0)
Anion gap: 10 (ref 5–15)
BUN: 36 mg/dL — ABNORMAL HIGH (ref 8–23)
CO2: 27 mmol/L (ref 22–32)
Calcium: 8.8 mg/dL — ABNORMAL LOW (ref 8.9–10.3)
Chloride: 98 mmol/L (ref 98–111)
Creatinine, Ser: 9.27 mg/dL — ABNORMAL HIGH (ref 0.61–1.24)
GFR calc Af Amer: 6 mL/min — ABNORMAL LOW (ref >60)
GFR calc non Af Amer: 5 mL/min — ABNORMAL LOW (ref >60)
Glucose, Bld: 106 mg/dL — ABNORMAL HIGH (ref 70–99)
Phosphorus: 4.9 mg/dL — ABNORMAL HIGH (ref 2.5–4.6)
Potassium: 4 mmol/L (ref 3.5–5.1)
Sodium: 135 mmol/L (ref 135–145)

## 2020-01-18 LAB — CBC
HCT: 28.3 % — ABNORMAL LOW (ref 39.0–52.0)
Hemoglobin: 9 g/dL — ABNORMAL LOW (ref 13.0–17.0)
MCH: 31.3 pg (ref 26.0–34.0)
MCHC: 31.8 g/dL (ref 30.0–36.0)
MCV: 98.3 fL (ref 80.0–100.0)
Platelets: 145 10*3/uL — ABNORMAL LOW (ref 150–400)
RBC: 2.88 MIL/uL — ABNORMAL LOW (ref 4.22–5.81)
RDW: 18.3 % — ABNORMAL HIGH (ref 11.5–15.5)
WBC: 3.3 10*3/uL — ABNORMAL LOW (ref 4.0–10.5)
nRBC: 0 % (ref 0.0–0.2)

## 2020-01-18 MED ORDER — PANTOPRAZOLE SODIUM 40 MG PO TBEC
40.0000 mg | DELAYED_RELEASE_TABLET | Freq: Every day | ORAL | 0 refills | Status: DC
Start: 2020-01-18 — End: 2020-12-11

## 2020-01-18 MED ORDER — LABETALOL HCL 200 MG PO TABS
200.0000 mg | ORAL_TABLET | Freq: Two times a day (BID) | ORAL | 0 refills | Status: DC
Start: 2020-01-18 — End: 2020-06-22

## 2020-01-18 NOTE — Discharge Summary (Addendum)
PATIENT DETAILS Name: Raymond [name] Age: 64 y.o. Sex: male Date of Birth: 04-14-1956 MRN: [id number]. Admitting Physician: Norval Morton, MD [id number], Malva Limes., FNP  Admit Date: 01/14/2020 Discharge date: 01/18/2020  Recommendations for Outpatient Follow-up:  1. Follow up with PCP in 1-2 weeks 2. Please obtain CMP/CBC in one week 3. EGD biopsy results-pending please follow.   Admitted From:  Home  Disposition: [place]: No  Equipment/Devices: None  Discharge Condition: Stable  CODE STATUS: FULL CODE  Diet recommendation:  Diet Order            Diet - low sodium heart healthy           Diet renal with fluid restriction Fluid restriction: 1200 mL Fluid; Room service appropriate? Yes; Fluid consistency: Thin  Diet effective now                  Brief Narrative: Patient is a 64 y.o. male with history of ESRD on HD TTS, CAD, PAF not on anticoagulation, PAD s/p femoropopliteal bypass in 2019, COPD, hepatitis C s/p interferon treatment, ongoing cocaine abuse, history of intestinal AVMs-who presented with shortness of breath-found to have acute hypoxic respiratory failure due to pulmonary edema requiring BiPAP support and severe acute blood loss anemia due to upper GI bleeding.  Significant events: 7/6>> admit to [facility]-severe hypoxia from pulmonary edema-and acute blood loss anemia from GI bleeding.  Significant studies: 7/6>> chest x-ray: Findings consistent with CHF. 7/7>> CTA abdomen/pelvis: No evidence of GI bleeding 04/26/2018>> echo: EF 60-65%  Antimicrobial therapy: None  Microbiology data: None  Procedures : 7/9>> EGD: Gastric AVM s/p APC Nodular duodenal mucosa in the first portion of the duodenum (biopsied) Duodenal/jejunal AVMs s/p APC ablation.EGD:  Consults: GI, nephrology  Brief Hospital Course: Acute hypoxic respiratory failure secondary to pulmonary edema in the setting of ESRD: Required BiPAP on initial  presentation-improved after HD.  Titrated to room air  GI bleeding (history of small bowel AVMs) with acute blood loss anemia: Hemoglobin 5.4 on admission-given a total of 3 units of PRBC this admission so far-hemoglobin stable at 9.0 this morning.  GI consulted-underwent EGD-which showed numerous AVMs in stomach duodenum/jejunum-s/p APC.  No further GI bleeding.  Hemoglobin stable.  Spoke with Dr. Joan Flores MD-okay to discharge.   If patient were to have recurrent GI bleeding in the future-needs capsule endoscopy.  ESRD: On HD TTS-nephrology followed closely.  Hyperkalemia: Resolved post HD  PAF: Remains rate controlled with metoprolol-not a candidate for anticoagulation given history of GI bleeding-multiple AVMs in EGD.[name]  Noncompliant to amiodarone-claims he has not been on it for the past several weeks/months.  PAD s/p left femoropopliteal bypass 2019: Remains stable-outpatient follow-up with vascular surgery.  HTN: Controlled-continue amlodipine-stop metoprolol-switch him to labetalol-given ongoing cocaine use-although infrequent.  Thrombocytopenia: Appears mild-chronic issue-stable for outpatient follow-up  Polysubstance abuse: Acknowledges tobacco, alcohol (claims only social drinker) and cocaine (snorts) use (claims infrequent-"once in a while").  Counseled-not sure if he has any intention of quitting at this point.   Discharge Diagnoses:  Active Problems:   ESRD (end stage renal disease) (HCC)   Polysubstance abuse (HCC)   COPD (chronic obstructive pulmonary disease) (HCC)   End-stage renal disease on hemodialysis (HCC)   Acute pulmonary edema (HCC)   PAD (peripheral artery disease) (HCC)   Acute respiratory failure with hypoxia (HCC)   PAF (paroxysmal atrial fibrillation) (HCC)   History of arteriovenous malformation (AVM)   UGIB (upper gastrointestinal bleed)  Acute blood loss anemia   Elevated troponin   Discharge Instructions:  Activity:  As tolerated    Discharge Instructions    Call MD for:   Complete by: As directed    Black/bloody stools.   Call MD for:  difficulty breathing, headache or visual disturbances   Complete by: As directed    Call MD for:  extreme fatigue   Complete by: As directed    Diet - low sodium heart healthy   Complete by: As directed    Discharge instructions   Complete by: As directed    Follow with Primary MD  [name], FNP in 1-2 weeks  EGD biopsy results are pending-please ask your primary care practitioner to follow-up on these results.  Avoid cocaine use  Follow with your hemodialysis center as previous-per your regular schedule.  Please get a complete blood count and chemistry panel checked by your Primary MD at your next visit, and again as instructed by your Primary MD.  Get Medicines reviewed and adjusted: Please take all your medications with you for your next visit with your Primary MD  Laboratory/radiological data: Please request your Primary MD to go over all hospital tests and procedure/radiological results at the follow up, please ask your Primary MD to get all Hospital records sent to his/her office.  In some cases, they will be blood work, cultures and biopsy results pending at the time of your discharge. Please request that your primary care M.D. follows up on these results.  Also Note the following: If you experience worsening of your admission symptoms, develop shortness of breath, life threatening emergency, suicidal or homicidal thoughts you must seek medical attention immediately by calling 911 or calling your MD immediately  if symptoms less severe.  You must read complete instructions/literature along with all the possible adverse reactions/side effects for all the Medicines you take and that have been prescribed to you. Take any new Medicines after you have completely understood and accpet all the possible adverse reactions/side effects.   Do not drive when taking Pain  medications or sleeping medications (Benzodaizepines)  Do not take more than prescribed Pain, Sleep and Anxiety Medications. It is not advisable to combine anxiety,sleep and pain medications without talking with your primary care practitioner  Special Instructions: If you have smoked or chewed Tobacco  in the last 2 yrs please stop smoking, stop any regular Alcohol  and or any Recreational drug use.  Wear Seat belts while driving.  Please note: You were cared for by a hospitalist during your hospital stay. Once you are discharged, your primary care physician will handle any further medical issues. Please note that NO REFILLS for any discharge medications will be authorized once you are discharged, as it is imperative that you return to your primary care physician (or establish a relationship with a primary care physician if you do not have one) for your post hospital discharge needs so that they can reassess your need for medications and monitor your lab values.   Increase activity slowly   Complete by: As directed    No wound care   Complete by: As directed      Allergies as of 01/18/2020   No Known Allergies     Medication List    STOP taking these medications   amiodarone 200 MG tablet Commonly known as: PACERONE   metoprolol succinate 50 MG 24 hr tablet Commonly known as: TOPROL-XL     TAKE these medications   albuterol  108 (90 Base) MCG/ACT inhaler Commonly known as: VENTOLIN HFA Inhale 1-2 puffs into the lungs every 4 (four) hours as needed for shortness of breath.   amLODipine 10 MG tablet Commonly known as: NORVASC Take 10 mg by mouth daily. Take with 2.5mg  for a total dose of 12.5mg .   amLODipine 2.5 MG tablet Commonly known as: NORVASC Take 2.5 mg by mouth daily. Take with 10mg  for a total dose of 12.5mg .   bismuth subsalicylate 629 MG chewable tablet Commonly known as: PEPTO BISMOL Chew 524 mg by mouth as needed for indigestion or diarrhea or loose stools.     labetalol 200 MG tablet Commonly known as: NORMODYNE Take 1 tablet (200 mg total) by mouth 2 (two) times daily.   pantoprazole 40 MG tablet Commonly known as: Protonix Take 1 tablet (40 mg total) by mouth daily.   QUEtiapine 100 MG tablet Commonly known as: SEROQUEL Take 100 mg by mouth at bedtime.   sevelamer carbonate 800 MG tablet Commonly known as: RENVELA Take 4 tablets (3,200 mg total) by mouth 3 (three) times daily with meals. What changed:   how much to take  additional instructions       No Known Allergies   Other Procedures/Studies: DG Chest Portable 1 View  Result Date: 01/14/2020 CLINICAL DATA:  Shortness of breath.  History of COPD and dialysis. EXAM: PORTABLE CHEST 1 VIEW COMPARISON:  05/28/2019. FINDINGS: Cardiomegaly with pulmonary venous congestion and bilateral interstitial prominence. Tiny bilateral pleural effusions. These findings are most consistent with CHF. Pneumonitis cannot be excluded. No pneumothorax. No acute bony abnormality. IMPRESSION: Cardiomegaly with pulmonary venous congestion and bilateral interstitial prominence. Tiny bilateral pleural effusions. Findings most consistent with CHF. Electronically Signed   By: Marcello Moores  Register   On: 01/14/2020 06:24   CT Angio Abd/Pel w/ and/or w/o  Result Date: 01/15/2020 CLINICAL DATA:  64 year old male with GI bleed. EXAM: CTA ABDOMEN AND PELVIS WITHOUT AND WITH CONTRAST TECHNIQUE: Multidetector CT imaging of the abdomen and pelvis was performed using the standard protocol during bolus administration of intravenous contrast. Multiplanar reconstructed images and MIPs were obtained and reviewed to evaluate the vascular anatomy. CONTRAST:  160mL OMNIPAQUE IOHEXOL 350 MG/ML SOLN COMPARISON:  CT abdomen pelvis dated 12/24/2018. FINDINGS: VASCULAR Aorta: Advanced atherosclerotic calcification of the abdominal aorta. There is a 2.7 cm infrarenal aortic ectasia. No aneurysmal dilatation or dissection. No periaortic  fluid. Celiac: Mild focal narrowing of the origin of the celiac artery. The celiac artery and its major branches are patent. SMA: There is atherosclerotic calcification of the origin of the SMA. The SMA is patent. There is replaced hepatic artery arising from the SMA. Renals: Atherosclerotic calcification of the renal artery ostia. There is diminished perfusion within the renal arteries. IMA: Patent without evidence of aneurysm, dissection, vasculitis or significant stenosis. Inflow: There is atherosclerotic calcification of the iliac arteries. The iliac arteries remain patent. Proximal Outflow: Advanced atherosclerotic calcification of the visualized femoral arteries. There is complete occlusion of the visualized proximal portion of the superficial femoral arteries. Veins: The SMV, splenic vein, and main portal vein are patent. The IVC is unremarkable. No portal venous gas. Review of the MIP images confirms the above findings. NON-VASCULAR Lower chest: Partially visualized small bilateral pleural effusion and bibasilar subsegmental atelectasis. Pneumonia is not excluded. Clinical correlation is recommended. There is cardiomegaly and coronary vascular calcification. No intra-abdominal free air or free fluid. Hepatobiliary: The liver is unremarkable. There is mild intrahepatic biliary ductal dilatation versus mild periportal edema. Correlation with  LFTs recommended. There is diffuse thickened appearance of the gallbladder wall with possible small pericholecystic fluid. Findings may be related to underlying liver disease although acute cholecystitis is not excluded. Correlation with clinical exam and further evaluation with ultrasound recommended. No calcified gallstone. Pancreas: Unremarkable. No pancreatic ductal dilatation or surrounding inflammatory changes. Spleen: Normal in size without focal abnormality. Adrenals/Urinary Tract: The adrenal glands are unremarkable. Moderate bilateral renal parenchyma atrophy  indeterminate bilateral hypodense lesions. The larger lesions demonstrate fluid attenuation consistent with cysts. The smaller lesions are too small to characterize. There is no hydronephrosis on either side. The visualized ureters appear unremarkable. The urinary bladder is collapsed. Stomach/Bowel: Several small scattered sigmoid diverticula without active inflammatory changes. There is no bowel obstruction or active inflammation. Normal appendix. No evidence of active GI bleed. Lymphatic: No adenopathy. Reproductive: The prostate and seminal vesicles are grossly unremarkable. Other: Small fat containing umbilical hernia. Musculoskeletal: Osteopenia with degenerative changes of the spine. No acute osseous pathology. IMPRESSION: 1. No CT evidence of active GI bleed. 2. Thickened appearance of the gallbladder wall with possible small pericholecystic fluid. Findings may be related to underlying liver disease although acute cholecystitis is not excluded. Correlation with clinical exam and further evaluation with ultrasound recommended. 3. Small scattered sigmoid diverticula. No bowel obstruction. Normal appendix. 4. Cardiomegaly with coronary vascular calcification. 5. Partially visualized small bilateral pleural effusions and bibasilar subsegmental atelectasis. Pneumonia is not excluded. Clinical correlation is recommended. 6. Aortic Atherosclerosis (ICD10-I70.0). Electronically Signed   By: Anner Crete M.D.   On: 01/15/2020 18:43     TODAY-DAY OF DISCHARGE:  Subjective:   Raymond [name] today has no headache,no chest abdominal pain,no new weakness tingling or numbness, feels much better wants to go home today.   Objective:   Blood pressure (!) 173/94, pulse 93, temperature 98 F (36.7 C), temperature source Oral, resp. rate 19, height 5\' 8"  (1.727 m), weight 57.7 kg, SpO2 93 %.  Intake/Output Summary (Last 24 hours) at 01/18/2020 1013 Last data filed at 01/18/2020 1000 Gross per 24 hour  Intake 593  ml  Output 0 ml  Net 593 ml   Filed Weights   01/16/20 0725 01/17/20 0955  Weight: 57.7 kg 57.7 kg    Exam: Awake Alert, Oriented *3, No new F.N deficits, Normal affect [place].AT,PERRAL Supple Neck,No JVD, No cervical lymphadenopathy appriciated.  Symmetrical Chest wall movement, Good air movement bilaterally, CTAB RRR,No Gallops,Rubs or new Murmurs, No Parasternal Heave +ve B.Sounds, Abd Soft, Non tender, No organomegaly appriciated, No rebound -guarding or rigidity. No Cyanosis, Clubbing or edema, No new Rash or bruise   PERTINENT RADIOLOGIC STUDIES: No results found.   PERTINENT LAB RESULTS: CBC: Recent Labs    01/17/20 0444 01/18/20 0409  WBC 2.5* 3.3*  HGB 8.1* 9.0*  HCT 26.1* 28.3*  PLT 137* 145*   CMET CMP     Component Value Date/Time   NA 133 (L) 01/17/2020 0444   NA 140 05/15/2018 1632   K 4.0 01/17/2020 0444   CL 95 (L) 01/17/2020 0444   CO2 30 01/17/2020 0444   GLUCOSE 78 01/17/2020 0444   BUN 23 01/17/2020 0444   BUN 38 (H) 05/15/2018 1632   CREATININE 5.89 (H) 01/17/2020 0444   CALCIUM 8.7 (L) 01/17/2020 0444   PROT 6.2 (L) 01/15/2020 0317   PROT 7.6 05/15/2018 1632   ALBUMIN 2.7 (L) 01/17/2020 0444   ALBUMIN 3.7 05/15/2018 1632   AST 12 (L) 01/15/2020 0317   ALT 13 01/15/2020 0317   ALKPHOS  45 01/15/2020 0317   BILITOT 0.7 01/15/2020 0317   BILITOT 0.3 05/15/2018 1632   GFRNONAA 9 (L) 01/17/2020 0444   GFRAA 11 (L) 01/17/2020 0444    GFR Estimated Creatinine Clearance: 10.1 mL/min (A) (by C-G formula based on SCr of 5.89 mg/dL (H)). No results for input(s): LIPASE, AMYLASE in the last 72 hours. No results for input(s): CKTOTAL, CKMB, CKMBINDEX, TROPONINI in the last 72 hours. Invalid input(s): POCBNP No results for input(s): DDIMER in the last 72 hours. No results for input(s): HGBA1C in the last 72 hours. No results for input(s): CHOL, HDL, LDLCALC, TRIG, CHOLHDL, LDLDIRECT in the last 72 hours. No results for input(s): TSH, T4TOTAL,  T3FREE, THYROIDAB in the last 72 hours.  Invalid input(s): FREET3 No results for input(s): VITAMINB12, FOLATE, FERRITIN, TIBC, IRON, RETICCTPCT in the last 72 hours. Coags: No results for input(s): INR in the last 72 hours.  Invalid input(s): PT Microbiology: Recent Results (from the past 240 hour(s))  SARS Coronavirus 2 by RT PCR (hospital order, performed in [facility] hospital lab) Nasopharyngeal Nasopharyngeal Swab     Status: None   Collection Time: 01/14/20  5:58 AM   Specimen: Nasopharyngeal Swab  Result Value Ref Range Status   SARS Coronavirus 2 NEGATIVE NEGATIVE Final    Comment: (NOTE) SARS-CoV-2 target nucleic acids are NOT DETECTED.  The SARS-CoV-2 RNA is generally detectable in upper and lower respiratory specimens during the acute phase of infection. The lowest concentration of SARS-CoV-2 viral copies this assay can detect is 250 copies / mL. A negative result does not preclude SARS-CoV-2 infection and should not be used as the sole basis for treatment or other patient management decisions.  A negative result may occur with improper specimen collection / handling, submission of specimen other than nasopharyngeal swab, presence of viral mutation(s) within the areas targeted by this assay, and inadequate number of viral copies (<250 copies / mL). A negative result must be combined with clinical observations, patient history, and epidemiological information.  Fact Sheet for Patients:   StrictlyIdeas.no  Fact Sheet for Healthcare Providers: BankingDealers.co.za  This test is not yet approved or  cleared by the [place] FDA and has been authorized for detection and/or diagnosis of SARS-CoV-2 by FDA under an Emergency Use Authorization (EUA).  This EUA will remain in effect (meaning this test can be used) for the duration of the COVID-19 declaration under Section 564(b)(1) of the Act, 21 U.S.C. section  360bbb-3(b)(1), unless the authorization is terminated or revoked sooner.  Performed at [facility], [place] [address] [place], [place] [address]   MRSA PCR Screening     Status: None   Collection Time: 01/14/20  6:52 PM   Specimen: Nasal Mucosa; Nasopharyngeal  Result Value Ref Range Status   MRSA by PCR NEGATIVE NEGATIVE Final    Comment:        The GeneXpert MRSA Assay (FDA approved for NASAL specimens only), is one component of a comprehensive MRSA colonization surveillance program. It is not intended to diagnose MRSA infection nor to guide or monitor treatment for MRSA infections. Performed at [facility], [place] [address], [place], [place] [address]     FURTHER DISCHARGE INSTRUCTIONS:  Get Medicines reviewed and adjusted: Please take all your medications with you for your next visit with your Primary MD  Laboratory/radiological data: Please request your Primary MD to go over all hospital tests and procedure/radiological results at the follow up, please ask your Primary MD to get  all Hospital records sent to his/her office.  In some cases, they will be blood work, cultures and biopsy results pending at the time of your discharge. Please request that your primary care M.D. goes through all the records of your hospital data and follows up on these results.  Also Note the following: If you experience worsening of your admission symptoms, develop shortness of breath, life threatening emergency, suicidal or homicidal thoughts you must seek medical attention immediately by calling 911 or calling your MD immediately  if symptoms less severe.  You must read complete instructions/literature along with all the possible adverse reactions/side effects for all the Medicines you take and that have been prescribed to you. Take any new Medicines after you have completely understood and accpet all the possible adverse reactions/side effects.   Do not drive when taking Pain  medications or sleeping medications (Benzodaizepines)  Do not take more than prescribed Pain, Sleep and Anxiety Medications. It is not advisable to combine anxiety,sleep and pain medications without talking with your primary care practitioner  Special Instructions: If you have smoked or chewed Tobacco  in the last 2 yrs please stop smoking, stop any regular Alcohol  and or any Recreational drug use.  Wear Seat belts while driving.  Please note: You were cared for by a hospitalist during your hospital stay. Once you are discharged, your primary care physician will handle any further medical issues. Please note that NO REFILLS for any discharge medications will be authorized once you are discharged, as it is imperative that you return to your primary care physician (or establish a relationship with a primary care physician if you do not have one) for your post hospital discharge needs so that they can reassess your need for medications and monitor your lab values.  Total Time spent coordinating discharge including counseling, education and face to face time equals 35 minutes.  SignedOren Binet 01/18/2020 10:13 AM

## 2020-01-18 NOTE — Plan of Care (Signed)
  Problem: Clinical Measurements: Goal: Ability to maintain clinical measurements within normal limits will improve Outcome: Progressing   Problem: Clinical Measurements: Goal: Respiratory complications will improve Outcome: Progressing   

## 2020-01-19 ENCOUNTER — Encounter (HOSPITAL_COMMUNITY): Payer: Self-pay | Admitting: Gastroenterology

## 2020-01-19 ENCOUNTER — Telehealth: Payer: Self-pay | Admitting: Nephrology

## 2020-01-19 NOTE — Telephone Encounter (Signed)
Transition of Care Contact from [place]  Date of Discharge: 01/18/20 Date of Contact: 01/19/20 Method of contact: phone  Attempted to contact patient to discuss transition of care from inpatient admission. Patient did not answer the phone.  Message was left on patient's voicemail informing them we would attempt to call them again and if unable to reach will follow up at dialysis.  Jen Mow, PA-C [facility] Pager: [phone]

## 2020-01-20 LAB — SURGICAL PATHOLOGY

## 2020-01-25 LAB — DRUG SCREEN 10 W/CONF, SERUM
Amphetamines, IA: NEGATIVE ng/mL
Barbiturates, IA: NEGATIVE ug/mL
Benzodiazepines, IA: NEGATIVE ng/mL
Cocaine & Metabolite, IA: POSITIVE ng/mL — AB
Methadone, IA: NEGATIVE ng/mL
Opiates, IA: NEGATIVE ng/mL
Oxycodones, IA: NEGATIVE ng/mL
Phencyclidine, IA: NEGATIVE ng/mL
Propoxyphene, IA: NEGATIVE ng/mL
THC(Marijuana) Metabolite, IA: NEGATIVE ng/mL

## 2020-01-25 LAB — COCAINE,MS,WB/SP RFX
Benzoylecgonine: >1500 ng/mL
Cocaine Confirmation: POSITIVE
Cocaine: NEGATIVE ng/mL

## 2020-02-10 ENCOUNTER — Telehealth: Payer: Self-pay

## 2020-02-10 NOTE — Telephone Encounter (Signed)
-----   Message from Yetta Flock, MD sent at 02/07/2020  7:53 AM EDT ----- Regarding: clinic follow up Ger[facility] can you help coordinate routine office visit follow up for this patient with me, thanks

## 2020-02-10 NOTE — Telephone Encounter (Signed)
Called pt to schedule an OV with [facility] (anemia/History of Hep C) but line is busy repeatedly. Called pt's sister, Hassan Rowan and her line just rang and then went busy as well.

## 2020-02-11 NOTE — Telephone Encounter (Signed)
Called patient and both patient's sisters again and was unable to reach anyone.   A letter has been mailed to the patient asking him to call and schedule an appt with Dr. Havery Moros at his earliest convenience.

## 2020-02-11 NOTE — Telephone Encounter (Signed)
Thanks Jan 

## 2020-03-12 DIAGNOSIS — D509 Iron deficiency anemia, unspecified: Secondary | ICD-10-CM | POA: Diagnosis not present

## 2020-03-12 DIAGNOSIS — N2581 Secondary hyperparathyroidism of renal origin: Secondary | ICD-10-CM | POA: Diagnosis not present

## 2020-03-12 DIAGNOSIS — R519 Headache, unspecified: Secondary | ICD-10-CM | POA: Diagnosis not present

## 2020-03-12 DIAGNOSIS — R52 Pain, unspecified: Secondary | ICD-10-CM | POA: Diagnosis not present

## 2020-03-12 DIAGNOSIS — L299 Pruritus, unspecified: Secondary | ICD-10-CM | POA: Diagnosis not present

## 2020-03-12 DIAGNOSIS — Z992 Dependence on renal dialysis: Secondary | ICD-10-CM | POA: Diagnosis not present

## 2020-03-12 DIAGNOSIS — R509 Fever, unspecified: Secondary | ICD-10-CM | POA: Diagnosis not present

## 2020-03-12 DIAGNOSIS — N186 End stage renal disease: Secondary | ICD-10-CM | POA: Diagnosis not present

## 2020-03-17 DIAGNOSIS — L299 Pruritus, unspecified: Secondary | ICD-10-CM | POA: Diagnosis not present

## 2020-03-17 DIAGNOSIS — Z992 Dependence on renal dialysis: Secondary | ICD-10-CM | POA: Diagnosis not present

## 2020-03-17 DIAGNOSIS — R52 Pain, unspecified: Secondary | ICD-10-CM | POA: Diagnosis not present

## 2020-03-17 DIAGNOSIS — N186 End stage renal disease: Secondary | ICD-10-CM | POA: Diagnosis not present

## 2020-03-17 DIAGNOSIS — D509 Iron deficiency anemia, unspecified: Secondary | ICD-10-CM | POA: Diagnosis not present

## 2020-03-17 DIAGNOSIS — N2581 Secondary hyperparathyroidism of renal origin: Secondary | ICD-10-CM | POA: Diagnosis not present

## 2020-03-17 DIAGNOSIS — R519 Headache, unspecified: Secondary | ICD-10-CM | POA: Diagnosis not present

## 2020-03-17 DIAGNOSIS — R509 Fever, unspecified: Secondary | ICD-10-CM | POA: Diagnosis not present

## 2020-03-19 DIAGNOSIS — R509 Fever, unspecified: Secondary | ICD-10-CM | POA: Diagnosis not present

## 2020-03-19 DIAGNOSIS — Z992 Dependence on renal dialysis: Secondary | ICD-10-CM | POA: Diagnosis not present

## 2020-03-19 DIAGNOSIS — N2581 Secondary hyperparathyroidism of renal origin: Secondary | ICD-10-CM | POA: Diagnosis not present

## 2020-03-19 DIAGNOSIS — R519 Headache, unspecified: Secondary | ICD-10-CM | POA: Diagnosis not present

## 2020-03-19 DIAGNOSIS — N186 End stage renal disease: Secondary | ICD-10-CM | POA: Diagnosis not present

## 2020-03-19 DIAGNOSIS — R52 Pain, unspecified: Secondary | ICD-10-CM | POA: Diagnosis not present

## 2020-03-19 DIAGNOSIS — L299 Pruritus, unspecified: Secondary | ICD-10-CM | POA: Diagnosis not present

## 2020-03-19 DIAGNOSIS — D509 Iron deficiency anemia, unspecified: Secondary | ICD-10-CM | POA: Diagnosis not present

## 2020-03-24 DIAGNOSIS — R519 Headache, unspecified: Secondary | ICD-10-CM | POA: Diagnosis not present

## 2020-03-24 DIAGNOSIS — R509 Fever, unspecified: Secondary | ICD-10-CM | POA: Diagnosis not present

## 2020-03-24 DIAGNOSIS — L299 Pruritus, unspecified: Secondary | ICD-10-CM | POA: Diagnosis not present

## 2020-03-24 DIAGNOSIS — D509 Iron deficiency anemia, unspecified: Secondary | ICD-10-CM | POA: Diagnosis not present

## 2020-03-24 DIAGNOSIS — R52 Pain, unspecified: Secondary | ICD-10-CM | POA: Diagnosis not present

## 2020-03-24 DIAGNOSIS — Z992 Dependence on renal dialysis: Secondary | ICD-10-CM | POA: Diagnosis not present

## 2020-03-24 DIAGNOSIS — N2581 Secondary hyperparathyroidism of renal origin: Secondary | ICD-10-CM | POA: Diagnosis not present

## 2020-03-24 DIAGNOSIS — N186 End stage renal disease: Secondary | ICD-10-CM | POA: Diagnosis not present

## 2020-03-28 DIAGNOSIS — N2581 Secondary hyperparathyroidism of renal origin: Secondary | ICD-10-CM | POA: Diagnosis not present

## 2020-03-28 DIAGNOSIS — D509 Iron deficiency anemia, unspecified: Secondary | ICD-10-CM | POA: Diagnosis not present

## 2020-03-28 DIAGNOSIS — R509 Fever, unspecified: Secondary | ICD-10-CM | POA: Diagnosis not present

## 2020-03-28 DIAGNOSIS — Z992 Dependence on renal dialysis: Secondary | ICD-10-CM | POA: Diagnosis not present

## 2020-03-28 DIAGNOSIS — R52 Pain, unspecified: Secondary | ICD-10-CM | POA: Diagnosis not present

## 2020-03-28 DIAGNOSIS — N186 End stage renal disease: Secondary | ICD-10-CM | POA: Diagnosis not present

## 2020-03-28 DIAGNOSIS — L299 Pruritus, unspecified: Secondary | ICD-10-CM | POA: Diagnosis not present

## 2020-03-28 DIAGNOSIS — R519 Headache, unspecified: Secondary | ICD-10-CM | POA: Diagnosis not present

## 2020-03-30 DIAGNOSIS — H903 Sensorineural hearing loss, bilateral: Secondary | ICD-10-CM | POA: Diagnosis not present

## 2020-03-31 DIAGNOSIS — D509 Iron deficiency anemia, unspecified: Secondary | ICD-10-CM | POA: Diagnosis not present

## 2020-03-31 DIAGNOSIS — N2581 Secondary hyperparathyroidism of renal origin: Secondary | ICD-10-CM | POA: Diagnosis not present

## 2020-03-31 DIAGNOSIS — R519 Headache, unspecified: Secondary | ICD-10-CM | POA: Diagnosis not present

## 2020-03-31 DIAGNOSIS — R52 Pain, unspecified: Secondary | ICD-10-CM | POA: Diagnosis not present

## 2020-03-31 DIAGNOSIS — R509 Fever, unspecified: Secondary | ICD-10-CM | POA: Diagnosis not present

## 2020-03-31 DIAGNOSIS — N186 End stage renal disease: Secondary | ICD-10-CM | POA: Diagnosis not present

## 2020-03-31 DIAGNOSIS — Z992 Dependence on renal dialysis: Secondary | ICD-10-CM | POA: Diagnosis not present

## 2020-03-31 DIAGNOSIS — L299 Pruritus, unspecified: Secondary | ICD-10-CM | POA: Diagnosis not present

## 2020-04-02 DIAGNOSIS — R52 Pain, unspecified: Secondary | ICD-10-CM | POA: Diagnosis not present

## 2020-04-02 DIAGNOSIS — R519 Headache, unspecified: Secondary | ICD-10-CM | POA: Diagnosis not present

## 2020-04-02 DIAGNOSIS — N186 End stage renal disease: Secondary | ICD-10-CM | POA: Diagnosis not present

## 2020-04-02 DIAGNOSIS — Z992 Dependence on renal dialysis: Secondary | ICD-10-CM | POA: Diagnosis not present

## 2020-04-02 DIAGNOSIS — D509 Iron deficiency anemia, unspecified: Secondary | ICD-10-CM | POA: Diagnosis not present

## 2020-04-02 DIAGNOSIS — N2581 Secondary hyperparathyroidism of renal origin: Secondary | ICD-10-CM | POA: Diagnosis not present

## 2020-04-02 DIAGNOSIS — R509 Fever, unspecified: Secondary | ICD-10-CM | POA: Diagnosis not present

## 2020-04-02 DIAGNOSIS — L299 Pruritus, unspecified: Secondary | ICD-10-CM | POA: Diagnosis not present

## 2020-04-04 DIAGNOSIS — R519 Headache, unspecified: Secondary | ICD-10-CM | POA: Diagnosis not present

## 2020-04-04 DIAGNOSIS — Z992 Dependence on renal dialysis: Secondary | ICD-10-CM | POA: Diagnosis not present

## 2020-04-04 DIAGNOSIS — D509 Iron deficiency anemia, unspecified: Secondary | ICD-10-CM | POA: Diagnosis not present

## 2020-04-04 DIAGNOSIS — N2581 Secondary hyperparathyroidism of renal origin: Secondary | ICD-10-CM | POA: Diagnosis not present

## 2020-04-04 DIAGNOSIS — L299 Pruritus, unspecified: Secondary | ICD-10-CM | POA: Diagnosis not present

## 2020-04-04 DIAGNOSIS — R52 Pain, unspecified: Secondary | ICD-10-CM | POA: Diagnosis not present

## 2020-04-04 DIAGNOSIS — R509 Fever, unspecified: Secondary | ICD-10-CM | POA: Diagnosis not present

## 2020-04-04 DIAGNOSIS — N186 End stage renal disease: Secondary | ICD-10-CM | POA: Diagnosis not present

## 2020-04-07 DIAGNOSIS — Z992 Dependence on renal dialysis: Secondary | ICD-10-CM | POA: Diagnosis not present

## 2020-04-07 DIAGNOSIS — L299 Pruritus, unspecified: Secondary | ICD-10-CM | POA: Diagnosis not present

## 2020-04-07 DIAGNOSIS — R509 Fever, unspecified: Secondary | ICD-10-CM | POA: Diagnosis not present

## 2020-04-07 DIAGNOSIS — N2581 Secondary hyperparathyroidism of renal origin: Secondary | ICD-10-CM | POA: Diagnosis not present

## 2020-04-07 DIAGNOSIS — D509 Iron deficiency anemia, unspecified: Secondary | ICD-10-CM | POA: Diagnosis not present

## 2020-04-07 DIAGNOSIS — R519 Headache, unspecified: Secondary | ICD-10-CM | POA: Diagnosis not present

## 2020-04-07 DIAGNOSIS — N186 End stage renal disease: Secondary | ICD-10-CM | POA: Diagnosis not present

## 2020-04-07 DIAGNOSIS — R52 Pain, unspecified: Secondary | ICD-10-CM | POA: Diagnosis not present

## 2020-04-08 ENCOUNTER — Other Ambulatory Visit: Payer: Self-pay | Admitting: *Deleted

## 2020-04-08 DIAGNOSIS — I739 Peripheral vascular disease, unspecified: Secondary | ICD-10-CM

## 2020-04-09 DIAGNOSIS — N186 End stage renal disease: Secondary | ICD-10-CM | POA: Diagnosis not present

## 2020-04-09 DIAGNOSIS — Z992 Dependence on renal dialysis: Secondary | ICD-10-CM | POA: Diagnosis not present

## 2020-04-09 DIAGNOSIS — N2889 Other specified disorders of kidney and ureter: Secondary | ICD-10-CM | POA: Diagnosis not present

## 2020-04-09 DIAGNOSIS — R509 Fever, unspecified: Secondary | ICD-10-CM | POA: Diagnosis not present

## 2020-04-09 DIAGNOSIS — D509 Iron deficiency anemia, unspecified: Secondary | ICD-10-CM | POA: Diagnosis not present

## 2020-04-09 DIAGNOSIS — N2581 Secondary hyperparathyroidism of renal origin: Secondary | ICD-10-CM | POA: Diagnosis not present

## 2020-04-09 DIAGNOSIS — L299 Pruritus, unspecified: Secondary | ICD-10-CM | POA: Diagnosis not present

## 2020-04-09 DIAGNOSIS — R52 Pain, unspecified: Secondary | ICD-10-CM | POA: Diagnosis not present

## 2020-04-09 DIAGNOSIS — R519 Headache, unspecified: Secondary | ICD-10-CM | POA: Diagnosis not present

## 2020-04-14 DIAGNOSIS — N186 End stage renal disease: Secondary | ICD-10-CM | POA: Diagnosis not present

## 2020-04-14 DIAGNOSIS — Z992 Dependence on renal dialysis: Secondary | ICD-10-CM | POA: Diagnosis not present

## 2020-04-14 DIAGNOSIS — R519 Headache, unspecified: Secondary | ICD-10-CM | POA: Diagnosis not present

## 2020-04-14 DIAGNOSIS — L299 Pruritus, unspecified: Secondary | ICD-10-CM | POA: Diagnosis not present

## 2020-04-14 DIAGNOSIS — R52 Pain, unspecified: Secondary | ICD-10-CM | POA: Diagnosis not present

## 2020-04-14 DIAGNOSIS — N2581 Secondary hyperparathyroidism of renal origin: Secondary | ICD-10-CM | POA: Diagnosis not present

## 2020-04-14 DIAGNOSIS — D509 Iron deficiency anemia, unspecified: Secondary | ICD-10-CM | POA: Diagnosis not present

## 2020-04-14 DIAGNOSIS — D631 Anemia in chronic kidney disease: Secondary | ICD-10-CM | POA: Diagnosis not present

## 2020-04-16 DIAGNOSIS — N186 End stage renal disease: Secondary | ICD-10-CM | POA: Diagnosis not present

## 2020-04-16 DIAGNOSIS — D509 Iron deficiency anemia, unspecified: Secondary | ICD-10-CM | POA: Diagnosis not present

## 2020-04-16 DIAGNOSIS — Z992 Dependence on renal dialysis: Secondary | ICD-10-CM | POA: Diagnosis not present

## 2020-04-16 DIAGNOSIS — L299 Pruritus, unspecified: Secondary | ICD-10-CM | POA: Diagnosis not present

## 2020-04-16 DIAGNOSIS — D631 Anemia in chronic kidney disease: Secondary | ICD-10-CM | POA: Diagnosis not present

## 2020-04-16 DIAGNOSIS — N2581 Secondary hyperparathyroidism of renal origin: Secondary | ICD-10-CM | POA: Diagnosis not present

## 2020-04-16 DIAGNOSIS — R52 Pain, unspecified: Secondary | ICD-10-CM | POA: Diagnosis not present

## 2020-04-16 DIAGNOSIS — R519 Headache, unspecified: Secondary | ICD-10-CM | POA: Diagnosis not present

## 2020-04-23 DIAGNOSIS — D509 Iron deficiency anemia, unspecified: Secondary | ICD-10-CM | POA: Diagnosis not present

## 2020-04-23 DIAGNOSIS — N2581 Secondary hyperparathyroidism of renal origin: Secondary | ICD-10-CM | POA: Diagnosis not present

## 2020-04-23 DIAGNOSIS — L299 Pruritus, unspecified: Secondary | ICD-10-CM | POA: Diagnosis not present

## 2020-04-23 DIAGNOSIS — Z992 Dependence on renal dialysis: Secondary | ICD-10-CM | POA: Diagnosis not present

## 2020-04-23 DIAGNOSIS — R52 Pain, unspecified: Secondary | ICD-10-CM | POA: Diagnosis not present

## 2020-04-23 DIAGNOSIS — D631 Anemia in chronic kidney disease: Secondary | ICD-10-CM | POA: Diagnosis not present

## 2020-04-23 DIAGNOSIS — N186 End stage renal disease: Secondary | ICD-10-CM | POA: Diagnosis not present

## 2020-04-23 DIAGNOSIS — R519 Headache, unspecified: Secondary | ICD-10-CM | POA: Diagnosis not present

## 2020-04-25 DIAGNOSIS — Z992 Dependence on renal dialysis: Secondary | ICD-10-CM | POA: Diagnosis not present

## 2020-04-25 DIAGNOSIS — N186 End stage renal disease: Secondary | ICD-10-CM | POA: Diagnosis not present

## 2020-04-25 DIAGNOSIS — R52 Pain, unspecified: Secondary | ICD-10-CM | POA: Diagnosis not present

## 2020-04-25 DIAGNOSIS — D509 Iron deficiency anemia, unspecified: Secondary | ICD-10-CM | POA: Diagnosis not present

## 2020-04-25 DIAGNOSIS — R519 Headache, unspecified: Secondary | ICD-10-CM | POA: Diagnosis not present

## 2020-04-25 DIAGNOSIS — D631 Anemia in chronic kidney disease: Secondary | ICD-10-CM | POA: Diagnosis not present

## 2020-04-25 DIAGNOSIS — L299 Pruritus, unspecified: Secondary | ICD-10-CM | POA: Diagnosis not present

## 2020-04-25 DIAGNOSIS — N2581 Secondary hyperparathyroidism of renal origin: Secondary | ICD-10-CM | POA: Diagnosis not present

## 2020-04-27 ENCOUNTER — Other Ambulatory Visit: Payer: Self-pay | Admitting: Surgery

## 2020-04-27 ENCOUNTER — Ambulatory Visit (HOSPITAL_COMMUNITY)
Admission: RE | Admit: 2020-04-27 | Discharge: 2020-04-27 | Disposition: A | Discharge status: Home / Self Care | Payer: Medicare Other | Source: Ambulatory Visit | Attending: Physician Assistant | Admitting: Physician Assistant

## 2020-04-27 ENCOUNTER — Ambulatory Visit: Payer: Medicare Other | Admitting: Surgery

## 2020-04-27 ENCOUNTER — Other Ambulatory Visit: Payer: Self-pay

## 2020-04-27 ENCOUNTER — Ambulatory Visit (INDEPENDENT_AMBULATORY_CARE_PROVIDER_SITE_OTHER)
Admission: RE | Admit: 2020-04-27 | Discharge: 2020-04-27 | Disposition: A | Discharge status: Home / Self Care | Payer: Medicare Other | Source: Ambulatory Visit | Attending: Physician Assistant | Admitting: Physician Assistant

## 2020-04-27 DIAGNOSIS — I739 Peripheral vascular disease, unspecified: Secondary | ICD-10-CM | POA: Diagnosis not present

## 2020-04-28 DIAGNOSIS — R519 Headache, unspecified: Secondary | ICD-10-CM | POA: Diagnosis not present

## 2020-04-28 DIAGNOSIS — D509 Iron deficiency anemia, unspecified: Secondary | ICD-10-CM | POA: Diagnosis not present

## 2020-04-28 DIAGNOSIS — R52 Pain, unspecified: Secondary | ICD-10-CM | POA: Diagnosis not present

## 2020-04-28 DIAGNOSIS — N186 End stage renal disease: Secondary | ICD-10-CM | POA: Diagnosis not present

## 2020-04-28 DIAGNOSIS — Z992 Dependence on renal dialysis: Secondary | ICD-10-CM | POA: Diagnosis not present

## 2020-04-28 DIAGNOSIS — L299 Pruritus, unspecified: Secondary | ICD-10-CM | POA: Diagnosis not present

## 2020-04-28 DIAGNOSIS — N2581 Secondary hyperparathyroidism of renal origin: Secondary | ICD-10-CM | POA: Diagnosis not present

## 2020-04-28 DIAGNOSIS — D631 Anemia in chronic kidney disease: Secondary | ICD-10-CM | POA: Diagnosis not present

## 2020-04-29 ENCOUNTER — Ambulatory Visit: Payer: Medicare Other | Admitting: Gastroenterology

## 2020-04-30 DIAGNOSIS — D509 Iron deficiency anemia, unspecified: Secondary | ICD-10-CM | POA: Diagnosis not present

## 2020-04-30 DIAGNOSIS — Z992 Dependence on renal dialysis: Secondary | ICD-10-CM | POA: Diagnosis not present

## 2020-04-30 DIAGNOSIS — L299 Pruritus, unspecified: Secondary | ICD-10-CM | POA: Diagnosis not present

## 2020-04-30 DIAGNOSIS — N2581 Secondary hyperparathyroidism of renal origin: Secondary | ICD-10-CM | POA: Diagnosis not present

## 2020-04-30 DIAGNOSIS — N186 End stage renal disease: Secondary | ICD-10-CM | POA: Diagnosis not present

## 2020-04-30 DIAGNOSIS — R519 Headache, unspecified: Secondary | ICD-10-CM | POA: Diagnosis not present

## 2020-04-30 DIAGNOSIS — R52 Pain, unspecified: Secondary | ICD-10-CM | POA: Diagnosis not present

## 2020-04-30 DIAGNOSIS — D631 Anemia in chronic kidney disease: Secondary | ICD-10-CM | POA: Diagnosis not present

## 2020-05-02 DIAGNOSIS — L299 Pruritus, unspecified: Secondary | ICD-10-CM | POA: Diagnosis not present

## 2020-05-02 DIAGNOSIS — R52 Pain, unspecified: Secondary | ICD-10-CM | POA: Diagnosis not present

## 2020-05-02 DIAGNOSIS — N186 End stage renal disease: Secondary | ICD-10-CM | POA: Diagnosis not present

## 2020-05-02 DIAGNOSIS — N2581 Secondary hyperparathyroidism of renal origin: Secondary | ICD-10-CM | POA: Diagnosis not present

## 2020-05-02 DIAGNOSIS — Z992 Dependence on renal dialysis: Secondary | ICD-10-CM | POA: Diagnosis not present

## 2020-05-02 DIAGNOSIS — R519 Headache, unspecified: Secondary | ICD-10-CM | POA: Diagnosis not present

## 2020-05-02 DIAGNOSIS — D509 Iron deficiency anemia, unspecified: Secondary | ICD-10-CM | POA: Diagnosis not present

## 2020-05-02 DIAGNOSIS — D631 Anemia in chronic kidney disease: Secondary | ICD-10-CM | POA: Diagnosis not present

## 2020-05-05 DIAGNOSIS — N186 End stage renal disease: Secondary | ICD-10-CM | POA: Diagnosis not present

## 2020-05-05 DIAGNOSIS — R519 Headache, unspecified: Secondary | ICD-10-CM | POA: Diagnosis not present

## 2020-05-05 DIAGNOSIS — R52 Pain, unspecified: Secondary | ICD-10-CM | POA: Diagnosis not present

## 2020-05-05 DIAGNOSIS — L299 Pruritus, unspecified: Secondary | ICD-10-CM | POA: Diagnosis not present

## 2020-05-05 DIAGNOSIS — N2581 Secondary hyperparathyroidism of renal origin: Secondary | ICD-10-CM | POA: Diagnosis not present

## 2020-05-05 DIAGNOSIS — D631 Anemia in chronic kidney disease: Secondary | ICD-10-CM | POA: Diagnosis not present

## 2020-05-05 DIAGNOSIS — D509 Iron deficiency anemia, unspecified: Secondary | ICD-10-CM | POA: Diagnosis not present

## 2020-05-05 DIAGNOSIS — Z992 Dependence on renal dialysis: Secondary | ICD-10-CM | POA: Diagnosis not present

## 2020-05-07 DIAGNOSIS — R519 Headache, unspecified: Secondary | ICD-10-CM | POA: Diagnosis not present

## 2020-05-07 DIAGNOSIS — Z992 Dependence on renal dialysis: Secondary | ICD-10-CM | POA: Diagnosis not present

## 2020-05-07 DIAGNOSIS — N186 End stage renal disease: Secondary | ICD-10-CM | POA: Diagnosis not present

## 2020-05-07 DIAGNOSIS — L299 Pruritus, unspecified: Secondary | ICD-10-CM | POA: Diagnosis not present

## 2020-05-07 DIAGNOSIS — N2581 Secondary hyperparathyroidism of renal origin: Secondary | ICD-10-CM | POA: Diagnosis not present

## 2020-05-07 DIAGNOSIS — D631 Anemia in chronic kidney disease: Secondary | ICD-10-CM | POA: Diagnosis not present

## 2020-05-07 DIAGNOSIS — R52 Pain, unspecified: Secondary | ICD-10-CM | POA: Diagnosis not present

## 2020-05-07 DIAGNOSIS — D509 Iron deficiency anemia, unspecified: Secondary | ICD-10-CM | POA: Diagnosis not present

## 2020-05-10 DIAGNOSIS — Z992 Dependence on renal dialysis: Secondary | ICD-10-CM | POA: Diagnosis not present

## 2020-05-10 DIAGNOSIS — N2889 Other specified disorders of kidney and ureter: Secondary | ICD-10-CM | POA: Diagnosis not present

## 2020-05-10 DIAGNOSIS — N186 End stage renal disease: Secondary | ICD-10-CM | POA: Diagnosis not present

## 2020-05-12 DIAGNOSIS — N2581 Secondary hyperparathyroidism of renal origin: Secondary | ICD-10-CM | POA: Diagnosis not present

## 2020-05-12 DIAGNOSIS — R519 Headache, unspecified: Secondary | ICD-10-CM | POA: Diagnosis not present

## 2020-05-12 DIAGNOSIS — D631 Anemia in chronic kidney disease: Secondary | ICD-10-CM | POA: Diagnosis not present

## 2020-05-12 DIAGNOSIS — L299 Pruritus, unspecified: Secondary | ICD-10-CM | POA: Diagnosis not present

## 2020-05-12 DIAGNOSIS — D509 Iron deficiency anemia, unspecified: Secondary | ICD-10-CM | POA: Diagnosis not present

## 2020-05-12 DIAGNOSIS — R52 Pain, unspecified: Secondary | ICD-10-CM | POA: Diagnosis not present

## 2020-05-12 DIAGNOSIS — N186 End stage renal disease: Secondary | ICD-10-CM | POA: Diagnosis not present

## 2020-05-12 DIAGNOSIS — Z992 Dependence on renal dialysis: Secondary | ICD-10-CM | POA: Diagnosis not present

## 2020-05-19 DIAGNOSIS — Z992 Dependence on renal dialysis: Secondary | ICD-10-CM | POA: Diagnosis not present

## 2020-05-19 DIAGNOSIS — N186 End stage renal disease: Secondary | ICD-10-CM | POA: Diagnosis not present

## 2020-05-19 DIAGNOSIS — N2581 Secondary hyperparathyroidism of renal origin: Secondary | ICD-10-CM | POA: Diagnosis not present

## 2020-05-19 DIAGNOSIS — R52 Pain, unspecified: Secondary | ICD-10-CM | POA: Diagnosis not present

## 2020-05-19 DIAGNOSIS — R519 Headache, unspecified: Secondary | ICD-10-CM | POA: Diagnosis not present

## 2020-05-19 DIAGNOSIS — D631 Anemia in chronic kidney disease: Secondary | ICD-10-CM | POA: Diagnosis not present

## 2020-05-19 DIAGNOSIS — L299 Pruritus, unspecified: Secondary | ICD-10-CM | POA: Diagnosis not present

## 2020-05-19 DIAGNOSIS — D509 Iron deficiency anemia, unspecified: Secondary | ICD-10-CM | POA: Diagnosis not present

## 2020-05-21 DIAGNOSIS — L299 Pruritus, unspecified: Secondary | ICD-10-CM | POA: Diagnosis not present

## 2020-05-21 DIAGNOSIS — R52 Pain, unspecified: Secondary | ICD-10-CM | POA: Diagnosis not present

## 2020-05-21 DIAGNOSIS — D509 Iron deficiency anemia, unspecified: Secondary | ICD-10-CM | POA: Diagnosis not present

## 2020-05-21 DIAGNOSIS — N2581 Secondary hyperparathyroidism of renal origin: Secondary | ICD-10-CM | POA: Diagnosis not present

## 2020-05-21 DIAGNOSIS — R519 Headache, unspecified: Secondary | ICD-10-CM | POA: Diagnosis not present

## 2020-05-21 DIAGNOSIS — D631 Anemia in chronic kidney disease: Secondary | ICD-10-CM | POA: Diagnosis not present

## 2020-05-21 DIAGNOSIS — N186 End stage renal disease: Secondary | ICD-10-CM | POA: Diagnosis not present

## 2020-05-21 DIAGNOSIS — Z992 Dependence on renal dialysis: Secondary | ICD-10-CM | POA: Diagnosis not present

## 2020-05-23 DIAGNOSIS — L299 Pruritus, unspecified: Secondary | ICD-10-CM | POA: Diagnosis not present

## 2020-05-23 DIAGNOSIS — D631 Anemia in chronic kidney disease: Secondary | ICD-10-CM | POA: Diagnosis not present

## 2020-05-23 DIAGNOSIS — Z992 Dependence on renal dialysis: Secondary | ICD-10-CM | POA: Diagnosis not present

## 2020-05-23 DIAGNOSIS — N186 End stage renal disease: Secondary | ICD-10-CM | POA: Diagnosis not present

## 2020-05-23 DIAGNOSIS — R519 Headache, unspecified: Secondary | ICD-10-CM | POA: Diagnosis not present

## 2020-05-23 DIAGNOSIS — N2581 Secondary hyperparathyroidism of renal origin: Secondary | ICD-10-CM | POA: Diagnosis not present

## 2020-05-23 DIAGNOSIS — R52 Pain, unspecified: Secondary | ICD-10-CM | POA: Diagnosis not present

## 2020-05-23 DIAGNOSIS — D509 Iron deficiency anemia, unspecified: Secondary | ICD-10-CM | POA: Diagnosis not present

## 2020-05-26 DIAGNOSIS — L299 Pruritus, unspecified: Secondary | ICD-10-CM | POA: Diagnosis not present

## 2020-05-26 DIAGNOSIS — R519 Headache, unspecified: Secondary | ICD-10-CM | POA: Diagnosis not present

## 2020-05-26 DIAGNOSIS — N186 End stage renal disease: Secondary | ICD-10-CM | POA: Diagnosis not present

## 2020-05-26 DIAGNOSIS — R52 Pain, unspecified: Secondary | ICD-10-CM | POA: Diagnosis not present

## 2020-05-26 DIAGNOSIS — D631 Anemia in chronic kidney disease: Secondary | ICD-10-CM | POA: Diagnosis not present

## 2020-05-26 DIAGNOSIS — Z992 Dependence on renal dialysis: Secondary | ICD-10-CM | POA: Diagnosis not present

## 2020-05-26 DIAGNOSIS — N2581 Secondary hyperparathyroidism of renal origin: Secondary | ICD-10-CM | POA: Diagnosis not present

## 2020-05-26 DIAGNOSIS — D509 Iron deficiency anemia, unspecified: Secondary | ICD-10-CM | POA: Diagnosis not present

## 2020-05-28 DIAGNOSIS — R519 Headache, unspecified: Secondary | ICD-10-CM | POA: Diagnosis not present

## 2020-05-28 DIAGNOSIS — D509 Iron deficiency anemia, unspecified: Secondary | ICD-10-CM | POA: Diagnosis not present

## 2020-05-28 DIAGNOSIS — N2581 Secondary hyperparathyroidism of renal origin: Secondary | ICD-10-CM | POA: Diagnosis not present

## 2020-05-28 DIAGNOSIS — D631 Anemia in chronic kidney disease: Secondary | ICD-10-CM | POA: Diagnosis not present

## 2020-05-28 DIAGNOSIS — N186 End stage renal disease: Secondary | ICD-10-CM | POA: Diagnosis not present

## 2020-05-28 DIAGNOSIS — R52 Pain, unspecified: Secondary | ICD-10-CM | POA: Diagnosis not present

## 2020-05-28 DIAGNOSIS — Z992 Dependence on renal dialysis: Secondary | ICD-10-CM | POA: Diagnosis not present

## 2020-05-28 DIAGNOSIS — L299 Pruritus, unspecified: Secondary | ICD-10-CM | POA: Diagnosis not present

## 2020-06-01 DIAGNOSIS — N2581 Secondary hyperparathyroidism of renal origin: Secondary | ICD-10-CM | POA: Diagnosis not present

## 2020-06-01 DIAGNOSIS — R52 Pain, unspecified: Secondary | ICD-10-CM | POA: Diagnosis not present

## 2020-06-01 DIAGNOSIS — R519 Headache, unspecified: Secondary | ICD-10-CM | POA: Diagnosis not present

## 2020-06-01 DIAGNOSIS — L299 Pruritus, unspecified: Secondary | ICD-10-CM | POA: Diagnosis not present

## 2020-06-01 DIAGNOSIS — D631 Anemia in chronic kidney disease: Secondary | ICD-10-CM | POA: Diagnosis not present

## 2020-06-01 DIAGNOSIS — D509 Iron deficiency anemia, unspecified: Secondary | ICD-10-CM | POA: Diagnosis not present

## 2020-06-01 DIAGNOSIS — Z992 Dependence on renal dialysis: Secondary | ICD-10-CM | POA: Diagnosis not present

## 2020-06-01 DIAGNOSIS — N186 End stage renal disease: Secondary | ICD-10-CM | POA: Diagnosis not present

## 2020-06-03 DIAGNOSIS — R519 Headache, unspecified: Secondary | ICD-10-CM | POA: Diagnosis not present

## 2020-06-03 DIAGNOSIS — Z992 Dependence on renal dialysis: Secondary | ICD-10-CM | POA: Diagnosis not present

## 2020-06-03 DIAGNOSIS — D509 Iron deficiency anemia, unspecified: Secondary | ICD-10-CM | POA: Diagnosis not present

## 2020-06-03 DIAGNOSIS — L299 Pruritus, unspecified: Secondary | ICD-10-CM | POA: Diagnosis not present

## 2020-06-03 DIAGNOSIS — N186 End stage renal disease: Secondary | ICD-10-CM | POA: Diagnosis not present

## 2020-06-03 DIAGNOSIS — R52 Pain, unspecified: Secondary | ICD-10-CM | POA: Diagnosis not present

## 2020-06-03 DIAGNOSIS — N2581 Secondary hyperparathyroidism of renal origin: Secondary | ICD-10-CM | POA: Diagnosis not present

## 2020-06-03 DIAGNOSIS — D631 Anemia in chronic kidney disease: Secondary | ICD-10-CM | POA: Diagnosis not present

## 2020-06-08 ENCOUNTER — Other Ambulatory Visit: Payer: Self-pay

## 2020-06-08 ENCOUNTER — Ambulatory Visit (INDEPENDENT_AMBULATORY_CARE_PROVIDER_SITE_OTHER): Payer: Medicare Other | Admitting: Surgery

## 2020-06-08 ENCOUNTER — Encounter: Payer: Self-pay | Admitting: Surgery

## 2020-06-08 VITALS — BP 187/122 | HR 122 | Resp 20 | Ht 68.0 in | Wt 171.0 lb

## 2020-06-08 DIAGNOSIS — I70213 Atherosclerosis of native arteries of extremities with intermittent claudication, bilateral legs: Secondary | ICD-10-CM

## 2020-06-08 NOTE — H&P (View-Only) (Signed)
Vascular and Vein Specialist of [facility]  Patient name: Raymond [name] MRN: [id number] DOB: 1955-07-19 Sex: male   REQUESTING PROVIDER:    Dr. Detterding   REASON FOR CONSULT:    PAD  HISTORY OF PRESENT ILLNESS:   Raymond [name] is a 64 y.o. male, who is back for follow-up of vascular disease.  He has a history of a left femoral to below-knee popliteal artery bypass graft with vein on 06-08-2018 for claudication by Dr. Donnetta Hutching.  He had a significant amount of swelling following his surgery.  DVT study was negative.  He is now having significant symptoms in his right leg.  He does not have a car and walks everywhere.  He has trouble walking any distance when it involves going up a [place].  He does not have any open wounds or rest pain.  The patient is on chronic hemodialysis on Monday Wednesday Friday.  He suffers from COPD and is a smoker.  He is medically managed for hypertension  PAST MEDICAL HISTORY    Past Medical History:  Diagnosis Date  . Anemia   . CAD (coronary artery disease)    a. 03/2018: cath showing 20 to 30% stenosis along the LAD, luminal irregularities along the RCA, and angiographically normal LCx  . COPD (chronic obstructive pulmonary disease) ([place])   . Dyspnea    "with too much fluid"  . ESRD (end stage renal disease) on dialysis [facility])    "MWF; Jeneen Rinks" (07/22/17)  . Hemodialysis patient ([place])   . Hepatitis C    Still positive s/p liver biopsy at [facility]  and interferon therapy for 6 months. Most recent lab work was on 10/24/12  . Hepatitis C    "took the tx; gone now" (12/05/2016)  Was treated  . History of blood transfusion ~ 2012/2013   "related to my kidneys; blood was low"  . Hypertension   . Peripheral arterial disease ([place])   . Substance abuse ([place])   . Thyroid disease      FAMILY HISTORY   Family History  Problem Relation Age of Onset  . Diabetes Father   . Hypertension Father   . Heart disease Father      SOCIAL HISTORY:   Social History   Socioeconomic History  . Marital status: Single    Spouse name: Not on file  . Number of children: Not on file  . Years of education: Not on file  . Highest education level: Not on file  Occupational History  . Not on file  Tobacco Use  . Smoking status: Current Every Day Smoker    Packs/day: 0.50    Years: 25.00    Pack years: 12.50    Types: Cigarettes    Last attempt to quit: 08/01/2011    Years since quitting: 8.8  . Smokeless tobacco: Never Used  . Tobacco comment: he smokes 6 cigarettes a week  Vaping Use  . Vaping Use: Never used  Substance and Sexual Activity  . Alcohol use: Yes    Comment: occasionally/ one half pint  . Drug use: Not Currently    Types: Cocaine  . Sexual activity: Not on file  Other Topics Concern  . Not on file  Social History Narrative   Pt. Is homeless.  He lives in shelters and motels when he can afford.     Social Determinants of Health   Financial Resource Strain:   . Difficulty of Paying Living Expenses: Not on file  Food Insecurity:   .  Worried About Charity fundraiser in the Last Year: Not on file  . Ran Out of Food in the Last Year: Not on file  Transportation Needs:   . Lack of Transportation (Medical): Not on file  . Lack of Transportation (Non-Medical): Not on file  Physical Activity:   . Days of Exercise per Week: Not on file  . Minutes of Exercise per Session: Not on file  Stress:   . Feeling of Stress : Not on file  Social Connections:   . Frequency of Communication with Friends and Family: Not on file  . Frequency of Social Gatherings with Friends and Family: Not on file  . Attends Religious Services: Not on file  . Active Member of Clubs or Organizations: Not on file  . Attends Archivist Meetings: Not on file  . Marital Status: Not on file  Intimate Partner Violence:   . Fear of Current or Ex-Partner: Not on file  . Emotionally Abused: Not on file  . Physically  Abused: Not on file  . Sexually Abused: Not on file    ALLERGIES:    No Known Allergies  CURRENT MEDICATIONS:    Current Outpatient Medications  Medication Sig Dispense Refill  . albuterol (VENTOLIN HFA) 108 (90 Base) MCG/ACT inhaler Inhale 1-2 puffs into the lungs every 4 (four) hours as needed for shortness of breath.    [name] amLODipine (NORVASC) 2.5 MG tablet Take 2.5 mg by mouth daily. Take with 10mg  for a total dose of 12.5mg .    . bismuth subsalicylate (PEPTO BISMOL) 262 MG chewable tablet Chew 524 mg by mouth as needed for indigestion or diarrhea or loose stools.    [name] labetalol (NORMODYNE) 200 MG tablet Take 1 tablet (200 mg total) by mouth 2 (two) times daily. 60 tablet 0  . pantoprazole (PROTONIX) 40 MG tablet Take 1 tablet (40 mg total) by mouth daily. 30 tablet 0  . QUEtiapine (SEROQUEL) 100 MG tablet Take 100 mg by mouth at bedtime.    . sevelamer carbonate (RENVELA) 800 MG tablet Take 4 tablets (3,200 mg total) by mouth 3 (three) times daily with meals. (Patient taking differently: Take 4,800 mg by mouth 3 (three) times daily with meals. Six tablets with each meal) 120 tablet 0  . amLODipine (NORVASC) 10 MG tablet Take 10 mg by mouth daily. Take with 2.5mg  for a total dose of 12.5mg . (Patient not taking: Reported on 06/08/2020)     No current facility-administered medications for this visit.   Facility-Administered Medications Ordered in Other Visits  Medication Dose Route Frequency Provider Last Rate Last Admin  . midazolam (VERSED) 5 MG/5ML injection    Anesthesia Intra-op Sammie Bench, CRNA   2 mg at 04/11/18 1042    REVIEW OF SYSTEMS:   [X]  denotes positive finding, [ ]  denotes negative finding Cardiac  Comments:  Chest pain or chest pressure:    Shortness of breath upon exertion:    Short of breath when lying flat:    Irregular heart rhythm:        Vascular    Pain in calf, thigh, or hip brought on by ambulation: x   Pain in feet at night that wakes you  up from your sleep:     Blood clot in your veins:    Leg swelling:         Pulmonary    Oxygen at home:    Productive cough:     Wheezing:  Neurologic    Sudden weakness in arms or legs:     Sudden numbness in arms or legs:     Sudden onset of difficulty speaking or slurred speech:    Temporary loss of vision in one eye:     Problems with dizziness:         Gastrointestinal    Blood in stool:      Vomited blood:         Genitourinary    Burning when urinating:     Blood in urine:        Psychiatric    Major depression:         Hematologic    Bleeding problems:    Problems with blood clotting too easily:        Skin    Rashes or ulcers:        Constitutional    Fever or chills:     PHYSICAL EXAM:   Vitals:   06/08/20 1455  BP: (!) 187/122  Pulse: (!) 122  Resp: 20  SpO2: 98%  Weight: 171 lb (77.6 kg)  Height: 5\' 8"  (1.727 m)    GENERAL: The patient is a well-nourished male, in no acute distress. The vital signs are documented above. CARDIAC: There is a regular rate and rhythm.  VASCULAR: Nonpalpable pedal pulses PULMONARY: Nonlabored respirations ABDOMEN: Soft and non-tender with normal pitched bowel sounds.  MUSCULOSKELETAL: There are no major deformities or cyanosis. NEUROLOGIC: No focal weakness or paresthesias are detected. SKIN: There are no ulcers or rashes noted. PSYCHIATRIC: The patient has a normal affect.  STUDIES:   I have reviewed the following studies:  +-------+-----------+-----------+------------+------------+  ABI/TBIToday's ABIToday's TBIPrevious ABIPrevious TBI  +-------+-----------+-----------+------------+------------+  Right [place]     0.58    1.33    0.30      +-------+-----------+-----------+------------+------------+  Left  1.13    1.06    1.18    0.85      +-------+-----------+-----------+------------+------------+  Right: Total occlusion noted in the superficial femoral  artery.   Left: Patent femoral to below knee popliteal graft with no evidence for  restenosis. Normal examination.  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  I have recommended proceeding with angiography to define his anatomy.  If he is a candidate for percutaneous intervention this would be performed at that time.  The patient understands that he may require surgical revascularization which would be done at a later date.  He would like to get this done as soon as possible.  He is on dialysis Tuesday Thursday Saturday and so I will try to arrange this for a nondialysis day.   Leia Alf, MD, FACS Vascular and Vein Specialists of [facility] [phone] Pager [phone]

## 2020-06-08 NOTE — Progress Notes (Signed)
Vascular and Vein Specialist of [facility]  Patient name: Raymond [name] MRN: [id number] DOB: 1956-01-10 Sex: male   REQUESTING PROVIDER:    Dr. Detterding   REASON FOR CONSULT:    PAD  HISTORY OF PRESENT ILLNESS:   Raymond [name] is a 64 y.o. male, who is back for follow-up of vascular disease.  He has a history of a left femoral to below-knee popliteal artery bypass graft with vein on 06-08-2018 for claudication by Dr. Donnetta Hutching.  He had a significant amount of swelling following his surgery.  DVT study was negative.  He is now having significant symptoms in his right leg.  He does not have a car and walks everywhere.  He has trouble walking any distance when it involves going up a [place].  He does not have any open wounds or rest pain.  The patient is on chronic hemodialysis on Monday Wednesday Friday.  He suffers from COPD and is a smoker.  He is medically managed for hypertension  PAST MEDICAL HISTORY    Past Medical History:  Diagnosis Date   Anemia    CAD (coronary artery disease)    a. 03/2018: cath showing 9 to 30% stenosis along the LAD, luminal irregularities along the RCA, and angiographically normal LCx   COPD (chronic obstructive pulmonary disease) ([facility])    Dyspnea    "with too much fluid"   ESRD (end stage renal disease) on dialysis ([place])    "MWF; Jeneen Rinks" (07/22/17)   Hemodialysis patient ([facility])    Hepatitis C    Still positive s/p liver biopsy at [facility]  and interferon therapy for 6 months. Most recent lab work was on 10/24/12   Hepatitis C    "took the tx; gone now" (12/05/2016)  Was treated   History of blood transfusion ~ 2012/2013   "related to my kidneys; blood was low"   Hypertension    Peripheral arterial disease ([place])    Substance abuse ([place])    Thyroid disease      FAMILY HISTORY   Family History  Problem Relation Age of Onset   Diabetes Father    Hypertension Father    Heart disease Father      SOCIAL HISTORY:   Social History   Socioeconomic History   Marital status: Single    Spouse name: Not on file   Number of children: Not on file   Years of education: Not on file   Highest education level: Not on file  Occupational History   Not on file  Tobacco Use   Smoking status: Current Every Day Smoker    Packs/day: 0.50    Years: 25.00    Pack years: 12.50    Types: Cigarettes    Last attempt to quit: 08/01/2011    Years since quitting: 8.8   Smokeless tobacco: Never Used   Tobacco comment: he smokes 6 cigarettes a week  Vaping Use   Vaping Use: Never used  Substance and Sexual Activity   Alcohol use: Yes    Comment: occasionally/ one half pint   Drug use: Not Currently    Types: Cocaine   Sexual activity: Not on file  Other Topics Concern   Not on file  Social History Narrative   Pt. Is homeless.  He lives in shelters and motels when he can afford.     Social Determinants of Health   Financial Resource Strain:    Difficulty of Paying Living Expenses: Not on file  Food Insecurity:  Worried About Charity fundraiser in the Last Year: Not on file   [employer] of Food in the Last Year: Not on file  Transportation Needs:    Lack of Transportation (Medical): Not on file   Lack of Transportation (Non-Medical): Not on file  Physical Activity:    Days of Exercise per Week: Not on file   Minutes of Exercise per Session: Not on file  Stress:    Feeling of Stress : Not on file  Social Connections:    Frequency of Communication with Friends and Family: Not on file   Frequency of Social Gatherings with Friends and Family: Not on file   Attends Religious Services: Not on file   Active Member of Clubs or Organizations: Not on file   Attends Archivist Meetings: Not on file   Marital Status: Not on file  Intimate Partner Violence:    Fear of Current or Ex-Partner: Not on file   Emotionally Abused: Not on file   Physically  Abused: Not on file   Sexually Abused: Not on file    ALLERGIES:    No Known Allergies  CURRENT MEDICATIONS:    Current Outpatient Medications  Medication Sig Dispense Refill   albuterol (VENTOLIN HFA) 108 (90 Base) MCG/ACT inhaler Inhale 1-2 puffs into the lungs every 4 (four) hours as needed for shortness of breath.     amLODipine (NORVASC) 2.5 MG tablet Take 2.5 mg by mouth daily. Take with 10mg  for a total dose of 12.5mg .     bismuth subsalicylate (PEPTO BISMOL) 262 MG chewable tablet Chew 524 mg by mouth as needed for indigestion or diarrhea or loose stools.     labetalol (NORMODYNE) 200 MG tablet Take 1 tablet (200 mg total) by mouth 2 (two) times daily. 60 tablet 0   pantoprazole (PROTONIX) 40 MG tablet Take 1 tablet (40 mg total) by mouth daily. 30 tablet 0   QUEtiapine (SEROQUEL) 100 MG tablet Take 100 mg by mouth at bedtime.     sevelamer carbonate (RENVELA) 800 MG tablet Take 4 tablets (3,200 mg total) by mouth 3 (three) times daily with meals. (Patient taking differently: Take 4,800 mg by mouth 3 (three) times daily with meals. Six tablets with each meal) 120 tablet 0   amLODipine (NORVASC) 10 MG tablet Take 10 mg by mouth daily. Take with 2.5mg  for a total dose of 12.5mg . (Patient not taking: Reported on 06/08/2020)     No current facility-administered medications for this visit.   Facility-Administered Medications Ordered in Other Visits  Medication Dose Route Frequency Provider Last Rate Last Admin   midazolam (VERSED) 5 MG/5ML injection    Anesthesia Intra-op Sammie Bench, CRNA   2 mg at 04/11/18 1042    REVIEW OF SYSTEMS:   [X]  denotes positive finding, [ ]  denotes negative finding Cardiac  Comments:  Chest pain or chest pressure:    Shortness of breath upon exertion:    Short of breath when lying flat:    Irregular heart rhythm:        Vascular    Pain in calf, thigh, or hip brought on by ambulation: x   Pain in feet at night that wakes you  up from your sleep:     Blood clot in your veins:    Leg swelling:         Pulmonary    Oxygen at home:    Productive cough:     Wheezing:  Neurologic    Sudden weakness in arms or legs:     Sudden numbness in arms or legs:     Sudden onset of difficulty speaking or slurred speech:    Temporary loss of vision in one eye:     Problems with dizziness:         Gastrointestinal    Blood in stool:      Vomited blood:         Genitourinary    Burning when urinating:     Blood in urine:        Psychiatric    Major depression:         Hematologic    Bleeding problems:    Problems with blood clotting too easily:        Skin    Rashes or ulcers:        Constitutional    Fever or chills:     PHYSICAL EXAM:   Vitals:   06/08/20 1455  BP: (!) 187/122  Pulse: (!) 122  Resp: 20  SpO2: 98%  Weight: 171 lb (77.6 kg)  Height: 5\' 8"  (1.727 m)    GENERAL: The patient is a well-nourished male, in no acute distress. The vital signs are documented above. CARDIAC: There is a regular rate and rhythm.  VASCULAR: Nonpalpable pedal pulses PULMONARY: Nonlabored respirations ABDOMEN: Soft and non-tender with normal pitched bowel sounds.  MUSCULOSKELETAL: There are no major deformities or cyanosis. NEUROLOGIC: No focal weakness or paresthesias are detected. SKIN: There are no ulcers or rashes noted. PSYCHIATRIC: The patient has a normal affect.  STUDIES:   I have reviewed the following studies:  +-------+-----------+-----------+------------+------------+   ABI/TBI Today's ABI Today's TBI Previous ABI Previous TBI   +-------+-----------+-----------+------------+------------+   Right  [place]      0.58     1.33     0.30       +-------+-----------+-----------+------------+------------+   Left   1.13     1.06     1.18     0.85       +-------+-----------+-----------+------------+------------+  Right: Total occlusion noted in the superficial femoral  artery.   Left: Patent femoral to below knee popliteal graft with no evidence for  restenosis. Normal examination.  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  I have recommended proceeding with angiography to define his anatomy.  If he is a candidate for percutaneous intervention this would be performed at that time.  The patient understands that he may require surgical revascularization which would be done at a later date.  He would like to get this done as soon as possible.  He is on dialysis Tuesday Thursday Saturday and so I will try to arrange this for a nondialysis day.   Leia Alf, MD, FACS Vascular and Vein Specialists of [facility] [phone] Pager [phone]

## 2020-06-09 DIAGNOSIS — Z992 Dependence on renal dialysis: Secondary | ICD-10-CM | POA: Diagnosis not present

## 2020-06-09 DIAGNOSIS — D631 Anemia in chronic kidney disease: Secondary | ICD-10-CM | POA: Diagnosis not present

## 2020-06-09 DIAGNOSIS — L299 Pruritus, unspecified: Secondary | ICD-10-CM | POA: Diagnosis not present

## 2020-06-09 DIAGNOSIS — N2889 Other specified disorders of kidney and ureter: Secondary | ICD-10-CM | POA: Diagnosis not present

## 2020-06-09 DIAGNOSIS — N186 End stage renal disease: Secondary | ICD-10-CM | POA: Diagnosis not present

## 2020-06-09 DIAGNOSIS — R519 Headache, unspecified: Secondary | ICD-10-CM | POA: Diagnosis not present

## 2020-06-09 DIAGNOSIS — N2581 Secondary hyperparathyroidism of renal origin: Secondary | ICD-10-CM | POA: Diagnosis not present

## 2020-06-09 DIAGNOSIS — D509 Iron deficiency anemia, unspecified: Secondary | ICD-10-CM | POA: Diagnosis not present

## 2020-06-09 DIAGNOSIS — R52 Pain, unspecified: Secondary | ICD-10-CM | POA: Diagnosis not present

## 2020-06-10 ENCOUNTER — Other Ambulatory Visit: Payer: Self-pay

## 2020-06-11 DIAGNOSIS — D631 Anemia in chronic kidney disease: Secondary | ICD-10-CM | POA: Diagnosis not present

## 2020-06-11 DIAGNOSIS — R509 Fever, unspecified: Secondary | ICD-10-CM | POA: Diagnosis not present

## 2020-06-11 DIAGNOSIS — L299 Pruritus, unspecified: Secondary | ICD-10-CM | POA: Diagnosis not present

## 2020-06-11 DIAGNOSIS — R52 Pain, unspecified: Secondary | ICD-10-CM | POA: Diagnosis not present

## 2020-06-11 DIAGNOSIS — D509 Iron deficiency anemia, unspecified: Secondary | ICD-10-CM | POA: Diagnosis not present

## 2020-06-11 DIAGNOSIS — Z992 Dependence on renal dialysis: Secondary | ICD-10-CM | POA: Diagnosis not present

## 2020-06-11 DIAGNOSIS — N2581 Secondary hyperparathyroidism of renal origin: Secondary | ICD-10-CM | POA: Diagnosis not present

## 2020-06-11 DIAGNOSIS — N186 End stage renal disease: Secondary | ICD-10-CM | POA: Diagnosis not present

## 2020-06-11 DIAGNOSIS — R519 Headache, unspecified: Secondary | ICD-10-CM | POA: Diagnosis not present

## 2020-06-12 ENCOUNTER — Other Ambulatory Visit (HOSPITAL_COMMUNITY)
Admission: RE | Admit: 2020-06-12 | Discharge: 2020-06-12 | Disposition: A | Discharge status: Home / Self Care | Payer: Medicare Other | Source: Ambulatory Visit | Attending: Vascular Surgery | Admitting: Vascular Surgery

## 2020-06-12 DIAGNOSIS — G9341 Metabolic encephalopathy: Secondary | ICD-10-CM | POA: Diagnosis not present

## 2020-06-12 DIAGNOSIS — I70211 Atherosclerosis of native arteries of extremities with intermittent claudication, right leg: Secondary | ICD-10-CM | POA: Diagnosis not present

## 2020-06-12 DIAGNOSIS — I120 Hypertensive chronic kidney disease with stage 5 chronic kidney disease or end stage renal disease: Secondary | ICD-10-CM | POA: Diagnosis not present

## 2020-06-12 DIAGNOSIS — T85898A Other specified complication of other internal prosthetic devices, implants and grafts, initial encounter: Secondary | ICD-10-CM | POA: Diagnosis not present

## 2020-06-12 DIAGNOSIS — D62 Acute posthemorrhagic anemia: Secondary | ICD-10-CM | POA: Diagnosis not present

## 2020-06-12 DIAGNOSIS — Z20822 Contact with and (suspected) exposure to covid-19: Secondary | ICD-10-CM | POA: Insufficient documentation

## 2020-06-12 DIAGNOSIS — D631 Anemia in chronic kidney disease: Secondary | ICD-10-CM | POA: Diagnosis not present

## 2020-06-12 DIAGNOSIS — I9589 Other hypotension: Secondary | ICD-10-CM | POA: Diagnosis not present

## 2020-06-12 DIAGNOSIS — R262 Difficulty in walking, not elsewhere classified: Secondary | ICD-10-CM | POA: Diagnosis not present

## 2020-06-12 DIAGNOSIS — Z01812 Encounter for preprocedural laboratory examination: Secondary | ICD-10-CM | POA: Insufficient documentation

## 2020-06-12 DIAGNOSIS — N2581 Secondary hyperparathyroidism of renal origin: Secondary | ICD-10-CM | POA: Diagnosis not present

## 2020-06-12 DIAGNOSIS — E785 Hyperlipidemia, unspecified: Secondary | ICD-10-CM | POA: Diagnosis not present

## 2020-06-12 DIAGNOSIS — Z992 Dependence on renal dialysis: Secondary | ICD-10-CM | POA: Diagnosis not present

## 2020-06-12 DIAGNOSIS — I4892 Unspecified atrial flutter: Secondary | ICD-10-CM | POA: Diagnosis not present

## 2020-06-12 DIAGNOSIS — J449 Chronic obstructive pulmonary disease, unspecified: Secondary | ICD-10-CM | POA: Diagnosis not present

## 2020-06-12 DIAGNOSIS — N186 End stage renal disease: Secondary | ICD-10-CM | POA: Diagnosis not present

## 2020-06-12 DIAGNOSIS — I953 Hypotension of hemodialysis: Secondary | ICD-10-CM | POA: Diagnosis not present

## 2020-06-12 DIAGNOSIS — I480 Paroxysmal atrial fibrillation: Secondary | ICD-10-CM | POA: Diagnosis not present

## 2020-06-12 DIAGNOSIS — F17200 Nicotine dependence, unspecified, uncomplicated: Secondary | ICD-10-CM | POA: Diagnosis not present

## 2020-06-12 DIAGNOSIS — E875 Hyperkalemia: Secondary | ICD-10-CM | POA: Diagnosis not present

## 2020-06-12 LAB — SARS CORONAVIRUS 2 (TAT 6-24 HRS): SARS Coronavirus 2: NEGATIVE

## 2020-06-15 ENCOUNTER — Observation Stay (HOSPITAL_COMMUNITY): Payer: Medicare Other | Admitting: Certified Registered Nurse Anesthetist

## 2020-06-15 ENCOUNTER — Other Ambulatory Visit: Payer: Self-pay

## 2020-06-15 ENCOUNTER — Inpatient Hospital Stay (HOSPITAL_COMMUNITY)
Admission: RE | Admit: 2020-06-15 | Discharge: 2020-06-22 | DRG: 252 | Disposition: A | Discharge status: Home Health | Payer: Medicare Other | Attending: Vascular Surgery | Admitting: Vascular Surgery

## 2020-06-15 ENCOUNTER — Encounter (HOSPITAL_COMMUNITY)
Admission: RE | Disposition: A | Discharge status: Home / Self Care | Payer: Self-pay | Source: Home / Self Care | Attending: Vascular Surgery

## 2020-06-15 DIAGNOSIS — Y92239 Unspecified place in hospital as the place of occurrence of the external cause: Secondary | ICD-10-CM | POA: Diagnosis not present

## 2020-06-15 DIAGNOSIS — T85898A Other specified complication of other internal prosthetic devices, implants and grafts, initial encounter: Secondary | ICD-10-CM | POA: Diagnosis not present

## 2020-06-15 DIAGNOSIS — Z7901 Long term (current) use of anticoagulants: Secondary | ICD-10-CM

## 2020-06-15 DIAGNOSIS — E785 Hyperlipidemia, unspecified: Secondary | ICD-10-CM | POA: Diagnosis present

## 2020-06-15 DIAGNOSIS — F17200 Nicotine dependence, unspecified, uncomplicated: Secondary | ICD-10-CM | POA: Diagnosis present

## 2020-06-15 DIAGNOSIS — G9341 Metabolic encephalopathy: Secondary | ICD-10-CM | POA: Diagnosis not present

## 2020-06-15 DIAGNOSIS — I9589 Other hypotension: Secondary | ICD-10-CM | POA: Diagnosis not present

## 2020-06-15 DIAGNOSIS — N4889 Other specified disorders of penis: Secondary | ICD-10-CM | POA: Diagnosis not present

## 2020-06-15 DIAGNOSIS — R262 Difficulty in walking, not elsewhere classified: Secondary | ICD-10-CM | POA: Diagnosis present

## 2020-06-15 DIAGNOSIS — N2581 Secondary hyperparathyroidism of renal origin: Secondary | ICD-10-CM | POA: Diagnosis present

## 2020-06-15 DIAGNOSIS — R509 Fever, unspecified: Secondary | ICD-10-CM

## 2020-06-15 DIAGNOSIS — Z992 Dependence on renal dialysis: Secondary | ICD-10-CM | POA: Diagnosis not present

## 2020-06-15 DIAGNOSIS — Z20822 Contact with and (suspected) exposure to covid-19: Secondary | ICD-10-CM | POA: Diagnosis present

## 2020-06-15 DIAGNOSIS — D631 Anemia in chronic kidney disease: Secondary | ICD-10-CM | POA: Diagnosis present

## 2020-06-15 DIAGNOSIS — I483 Typical atrial flutter: Secondary | ICD-10-CM | POA: Diagnosis not present

## 2020-06-15 DIAGNOSIS — J449 Chronic obstructive pulmonary disease, unspecified: Secondary | ICD-10-CM | POA: Diagnosis present

## 2020-06-15 DIAGNOSIS — I953 Hypotension of hemodialysis: Secondary | ICD-10-CM | POA: Diagnosis not present

## 2020-06-15 DIAGNOSIS — R0989 Other specified symptoms and signs involving the circulatory and respiratory systems: Secondary | ICD-10-CM | POA: Diagnosis not present

## 2020-06-15 DIAGNOSIS — N186 End stage renal disease: Secondary | ICD-10-CM | POA: Diagnosis not present

## 2020-06-15 DIAGNOSIS — B1920 Unspecified viral hepatitis C without hepatic coma: Secondary | ICD-10-CM | POA: Diagnosis present

## 2020-06-15 DIAGNOSIS — M7981 Nontraumatic hematoma of soft tissue: Secondary | ICD-10-CM | POA: Diagnosis not present

## 2020-06-15 DIAGNOSIS — D62 Acute posthemorrhagic anemia: Secondary | ICD-10-CM | POA: Diagnosis not present

## 2020-06-15 DIAGNOSIS — I4892 Unspecified atrial flutter: Secondary | ICD-10-CM

## 2020-06-15 DIAGNOSIS — S3022XA Contusion of scrotum and testes, initial encounter: Secondary | ICD-10-CM | POA: Diagnosis not present

## 2020-06-15 DIAGNOSIS — Z79899 Other long term (current) drug therapy: Secondary | ICD-10-CM

## 2020-06-15 DIAGNOSIS — I70211 Atherosclerosis of native arteries of extremities with intermittent claudication, right leg: Secondary | ICD-10-CM | POA: Diagnosis not present

## 2020-06-15 DIAGNOSIS — I480 Paroxysmal atrial fibrillation: Secondary | ICD-10-CM | POA: Diagnosis present

## 2020-06-15 DIAGNOSIS — I739 Peripheral vascular disease, unspecified: Secondary | ICD-10-CM

## 2020-06-15 DIAGNOSIS — Z833 Family history of diabetes mellitus: Secondary | ICD-10-CM

## 2020-06-15 DIAGNOSIS — I120 Hypertensive chronic kidney disease with stage 5 chronic kidney disease or end stage renal disease: Secondary | ICD-10-CM | POA: Diagnosis not present

## 2020-06-15 DIAGNOSIS — E875 Hyperkalemia: Secondary | ICD-10-CM | POA: Diagnosis not present

## 2020-06-15 DIAGNOSIS — Z8249 Family history of ischemic heart disease and other diseases of the circulatory system: Secondary | ICD-10-CM

## 2020-06-15 DIAGNOSIS — I97638 Postprocedural hematoma of a circulatory system organ or structure following other circulatory system procedure: Secondary | ICD-10-CM | POA: Diagnosis not present

## 2020-06-15 HISTORY — PX: ABDOMINAL AORTOGRAM W/LOWER EXTREMITY: CATH118223

## 2020-06-15 HISTORY — PX: PERIPHERAL VASCULAR INTERVENTION: CATH118257

## 2020-06-15 HISTORY — PX: WOUND EXPLORATION: SHX6188

## 2020-06-15 LAB — POCT I-STAT, CHEM 8
BUN: 91 mg/dL — ABNORMAL HIGH (ref 8–23)
Calcium, Ion: 1.01 mmol/L — ABNORMAL LOW (ref 1.15–1.40)
Chloride: 95 mmol/L — ABNORMAL LOW (ref 98–111)
Creatinine, Ser: 15.4 mg/dL — ABNORMAL HIGH (ref 0.61–1.24)
Glucose, Bld: 110 mg/dL — ABNORMAL HIGH (ref 70–99)
HCT: 41 % (ref 39.0–52.0)
Hemoglobin: 13.9 g/dL (ref 13.0–17.0)
Potassium: 5.9 mmol/L — ABNORMAL HIGH (ref 3.5–5.1)
Sodium: 135 mmol/L (ref 135–145)
TCO2: 31 mmol/L (ref 22–32)

## 2020-06-15 LAB — GLUCOSE, CAPILLARY
Glucose-Capillary: 169 mg/dL — ABNORMAL HIGH (ref 70–99)
Glucose-Capillary: 182 mg/dL — ABNORMAL HIGH (ref 70–99)

## 2020-06-15 LAB — CBC
HCT: 29.6 % — ABNORMAL LOW (ref 39.0–52.0)
Hemoglobin: 9 g/dL — ABNORMAL LOW (ref 13.0–17.0)
MCH: 33.8 pg (ref 26.0–34.0)
MCHC: 30.4 g/dL (ref 30.0–36.0)
MCV: 111.3 fL — ABNORMAL HIGH (ref 80.0–100.0)
Platelets: 101 10*3/uL — ABNORMAL LOW (ref 150–400)
RBC: 2.66 MIL/uL — ABNORMAL LOW (ref 4.22–5.81)
RDW: 16.6 % — ABNORMAL HIGH (ref 11.5–15.5)
WBC: 4.5 10*3/uL (ref 4.0–10.5)
nRBC: 0 % (ref 0.0–0.2)

## 2020-06-15 LAB — CREATININE, SERUM
Creatinine, Ser: 17.2 mg/dL — ABNORMAL HIGH (ref 0.61–1.24)
GFR, Estimated: 3 mL/min — ABNORMAL LOW (ref >60)

## 2020-06-15 SURGERY — WOUND EXPLORATION
Anesthesia: General | Laterality: Left

## 2020-06-15 SURGERY — ABDOMINAL AORTOGRAM W/LOWER EXTREMITY
Anesthesia: LOCAL | Laterality: Right

## 2020-06-15 MED ORDER — HEPARIN (PORCINE) IN NACL 1000-0.9 UT/500ML-% IV SOLN
INTRAVENOUS | Status: AC
  Filled 2020-06-15: qty 1000

## 2020-06-15 MED ORDER — ONDANSETRON HCL 4 MG/2ML IJ SOLN
INTRAMUSCULAR | Status: AC
  Filled 2020-06-15: qty 2

## 2020-06-15 MED ORDER — ONDANSETRON HCL 4 MG/2ML IJ SOLN: 4.0000 mg | Freq: Four times a day (QID) | INTRAMUSCULAR | Status: DC | PRN

## 2020-06-15 MED ORDER — FENTANYL CITRATE (PF) 250 MCG/5ML IJ SOLN
INTRAMUSCULAR | Status: DC | PRN
  Administered 2020-06-15 (×2): 100 ug via INTRAVENOUS

## 2020-06-15 MED ORDER — SODIUM CHLORIDE 0.9 % IV SOLN
250.0000 mL | INTRAVENOUS | Status: DC | PRN
  Administered 2020-06-15: 250 mL via INTRAVENOUS

## 2020-06-15 MED ORDER — 0.9 % SODIUM CHLORIDE (POUR BTL) OPTIME
TOPICAL | Status: DC | PRN
  Administered 2020-06-15: 1000 mL

## 2020-06-15 MED ORDER — SODIUM CHLORIDE 0.9 % IV SOLN: INTRAVENOUS | Status: DC | PRN

## 2020-06-15 MED ORDER — MIDAZOLAM HCL 2 MG/2ML IJ SOLN
INTRAMUSCULAR | Status: DC | PRN
  Administered 2020-06-15: 2 mg via INTRAVENOUS

## 2020-06-15 MED ORDER — PHENYLEPHRINE 40 MCG/ML (10ML) SYRINGE FOR IV PUSH (FOR BLOOD PRESSURE SUPPORT)
PREFILLED_SYRINGE | INTRAVENOUS | Status: AC
  Filled 2020-06-15: qty 20

## 2020-06-15 MED ORDER — FENTANYL CITRATE (PF) 100 MCG/2ML IJ SOLN
INTRAMUSCULAR | Status: AC
  Filled 2020-06-15: qty 2

## 2020-06-15 MED ORDER — SUGAMMADEX SODIUM 200 MG/2ML IV SOLN
INTRAVENOUS | Status: DC | PRN
  Administered 2020-06-15: 100 mg via INTRAVENOUS
  Administered 2020-06-15: 200 mg via INTRAVENOUS

## 2020-06-15 MED ORDER — HEPARIN SODIUM (PORCINE) 1000 UNIT/ML IJ SOLN
INTRAMUSCULAR | Status: DC | PRN
  Administered 2020-06-15: 8000 [IU] via INTRAVENOUS

## 2020-06-15 MED ORDER — METOPROLOL TARTRATE 5 MG/5ML IV SOLN: 2.0000 mg | INTRAVENOUS | Status: DC | PRN

## 2020-06-15 MED ORDER — LABETALOL HCL 5 MG/ML IV SOLN
INTRAVENOUS | Status: DC | PRN
  Administered 2020-06-15 (×2): 10 mg via INTRAVENOUS

## 2020-06-15 MED ORDER — HEPARIN SODIUM (PORCINE) 5000 UNIT/ML IJ SOLN: 5000.0000 [IU] | Freq: Three times a day (TID) | INTRAMUSCULAR | Status: DC

## 2020-06-15 MED ORDER — CHLORHEXIDINE GLUCONATE CLOTH 2 % EX PADS
6.0000 | MEDICATED_PAD | Freq: Every day | CUTANEOUS | Status: DC
  Administered 2020-06-16: 6 via TOPICAL

## 2020-06-15 MED ORDER — HYDROMORPHONE HCL 1 MG/ML IJ SOLN
0.5000 mg | INTRAMUSCULAR | Status: DC | PRN
  Administered 2020-06-15: 1 mg via INTRAVENOUS
  Administered 2020-06-15 – 2020-06-16 (×2): 0.5 mg via INTRAVENOUS
  Administered 2020-06-17 – 2020-06-18 (×2): 1 mg via INTRAVENOUS
  Administered 2020-06-18: 0.5 mg via INTRAVENOUS
  Filled 2020-06-15 (×5): qty 1

## 2020-06-15 MED ORDER — PHENYLEPHRINE HCL-NACL 10-0.9 MG/250ML-% IV SOLN
INTRAVENOUS | Status: DC | PRN
  Administered 2020-06-15: 20 ug/min via INTRAVENOUS

## 2020-06-15 MED ORDER — PANTOPRAZOLE SODIUM 40 MG PO TBEC
40.0000 mg | DELAYED_RELEASE_TABLET | Freq: Every day | ORAL | Status: DC
  Administered 2020-06-16 – 2020-06-22 (×7): 40 mg via ORAL
  Filled 2020-06-15 (×8): qty 1

## 2020-06-15 MED ORDER — FENTANYL CITRATE (PF) 250 MCG/5ML IJ SOLN
INTRAMUSCULAR | Status: AC
  Filled 2020-06-15: qty 5

## 2020-06-15 MED ORDER — SODIUM CHLORIDE 0.9% FLUSH: 3.0000 mL | INTRAVENOUS | Status: DC | PRN

## 2020-06-15 MED ORDER — SENNOSIDES-DOCUSATE SODIUM 8.6-50 MG PO TABS: 1.0000 | ORAL_TABLET | Freq: Every evening | ORAL | Status: DC | PRN

## 2020-06-15 MED ORDER — PROPOFOL 10 MG/ML IV BOLUS
INTRAVENOUS | Status: AC
  Filled 2020-06-15: qty 20

## 2020-06-15 MED ORDER — HEMOSTATIC AGENTS (NO CHARGE) OPTIME
TOPICAL | Status: DC | PRN
  Administered 2020-06-15: 1 via TOPICAL

## 2020-06-15 MED ORDER — LABETALOL HCL 5 MG/ML IV SOLN
INTRAVENOUS | Status: AC
  Filled 2020-06-15: qty 4

## 2020-06-15 MED ORDER — SODIUM CHLORIDE 0.9 % IV SOLN
INTRAVENOUS | Status: DC | PRN
  Administered 2020-06-15: 500 mL

## 2020-06-15 MED ORDER — CLOPIDOGREL BISULFATE 75 MG PO TABS
300.0000 mg | ORAL_TABLET | Freq: Once | ORAL | Status: DC
  Filled 2020-06-15: qty 4

## 2020-06-15 MED ORDER — MIDAZOLAM HCL 2 MG/2ML IJ SOLN
INTRAMUSCULAR | Status: DC | PRN
  Administered 2020-06-15: 1 mg via INTRAVENOUS

## 2020-06-15 MED ORDER — MIDAZOLAM HCL 2 MG/2ML IJ SOLN
INTRAMUSCULAR | Status: AC
  Filled 2020-06-15: qty 2

## 2020-06-15 MED ORDER — ACETAMINOPHEN 325 MG PO TABS
650.0000 mg | ORAL_TABLET | ORAL | Status: DC | PRN
  Administered 2020-06-20 (×2): 650 mg via ORAL
  Filled 2020-06-15 (×2): qty 2

## 2020-06-15 MED ORDER — SODIUM CHLORIDE 0.9 % IV SOLN
INTRAVENOUS | Status: AC
  Filled 2020-06-15: qty 1.2

## 2020-06-15 MED ORDER — IOPAMIDOL (ISOVUE-370) INJECTION 76%
INTRAVENOUS | Status: DC | PRN
  Administered 2020-06-15: 150 mL

## 2020-06-15 MED ORDER — SODIUM CHLORIDE 0.9 % IV SOLN: INTRAVENOUS | Status: DC

## 2020-06-15 MED ORDER — HYDRALAZINE HCL 20 MG/ML IJ SOLN: 5.0000 mg | INTRAMUSCULAR | Status: DC | PRN

## 2020-06-15 MED ORDER — ROSUVASTATIN CALCIUM 5 MG PO TABS
10.0000 mg | ORAL_TABLET | Freq: Every day | ORAL | Status: DC
  Administered 2020-06-15 – 2020-06-22 (×8): 10 mg via ORAL
  Filled 2020-06-15 (×8): qty 2

## 2020-06-15 MED ORDER — FLEET ENEMA 7-19 GM/118ML RE ENEM: 1.0000 | ENEMA | Freq: Once | RECTAL | Status: DC | PRN

## 2020-06-15 MED ORDER — HEPARIN SODIUM (PORCINE) 5000 UNIT/ML IJ SOLN
5000.0000 [IU] | Freq: Three times a day (TID) | INTRAMUSCULAR | Status: DC
  Administered 2020-06-16 – 2020-06-19 (×7): 5000 [IU] via SUBCUTANEOUS
  Filled 2020-06-15 (×8): qty 1

## 2020-06-15 MED ORDER — NALOXONE HCL 0.4 MG/ML IJ SOLN
INTRAMUSCULAR | Status: AC
  Filled 2020-06-15: qty 1

## 2020-06-15 MED ORDER — CEFAZOLIN SODIUM-DEXTROSE 2-3 GM-%(50ML) IV SOLR
INTRAVENOUS | Status: DC | PRN
  Administered 2020-06-15: 2 g via INTRAVENOUS

## 2020-06-15 MED ORDER — HYDROMORPHONE HCL 1 MG/ML IJ SOLN
INTRAMUSCULAR | Status: AC
  Filled 2020-06-15: qty 0.5

## 2020-06-15 MED ORDER — HEPARIN (PORCINE) IN NACL 1000-0.9 UT/500ML-% IV SOLN
INTRAVENOUS | Status: DC | PRN
  Administered 2020-06-15 (×2): 500 mL

## 2020-06-15 MED ORDER — QUETIAPINE FUMARATE 50 MG PO TABS
100.0000 mg | ORAL_TABLET | Freq: Every evening | ORAL | Status: DC | PRN
  Administered 2020-06-16 – 2020-06-21 (×6): 100 mg via ORAL
  Filled 2020-06-15 (×6): qty 2

## 2020-06-15 MED ORDER — SODIUM CHLORIDE 0.9% FLUSH
3.0000 mL | Freq: Two times a day (BID) | INTRAVENOUS | Status: DC
  Administered 2020-06-15 – 2020-06-22 (×13): 3 mL via INTRAVENOUS

## 2020-06-15 MED ORDER — FENTANYL CITRATE (PF) 100 MCG/2ML IJ SOLN: 25.0000 ug | INTRAMUSCULAR | Status: DC | PRN

## 2020-06-15 MED ORDER — LABETALOL HCL 200 MG PO TABS
200.0000 mg | ORAL_TABLET | Freq: Two times a day (BID) | ORAL | Status: DC
  Administered 2020-06-15 – 2020-06-18 (×4): 200 mg via ORAL
  Filled 2020-06-15 (×6): qty 1

## 2020-06-15 MED ORDER — OXYCODONE HCL 5 MG PO TABS
5.0000 mg | ORAL_TABLET | ORAL | Status: DC | PRN
  Administered 2020-06-16 – 2020-06-17 (×3): 5 mg via ORAL
  Administered 2020-06-18 – 2020-06-21 (×4): 10 mg via ORAL
  Filled 2020-06-15 (×3): qty 2
  Filled 2020-06-15: qty 1
  Filled 2020-06-15: qty 2
  Filled 2020-06-15 (×2): qty 1

## 2020-06-15 MED ORDER — DEXAMETHASONE SODIUM PHOSPHATE 10 MG/ML IJ SOLN
INTRAMUSCULAR | Status: AC
  Filled 2020-06-15: qty 1

## 2020-06-15 MED ORDER — LIDOCAINE HCL (PF) 1 % IJ SOLN
INTRAMUSCULAR | Status: DC | PRN
  Administered 2020-06-15: 15 mL via INTRADERMAL

## 2020-06-15 MED ORDER — EPHEDRINE 5 MG/ML INJ
INTRAVENOUS | Status: AC
  Filled 2020-06-15: qty 10

## 2020-06-15 MED ORDER — ONDANSETRON HCL 4 MG/2ML IJ SOLN
INTRAMUSCULAR | Status: DC | PRN
  Administered 2020-06-15: 4 mg via INTRAVENOUS

## 2020-06-15 MED ORDER — LABETALOL HCL 5 MG/ML IV SOLN: 10.0000 mg | INTRAVENOUS | Status: DC | PRN

## 2020-06-15 MED ORDER — IODIXANOL 320 MG/ML IV SOLN: INTRAVENOUS | Status: DC | PRN

## 2020-06-15 MED ORDER — ROCURONIUM BROMIDE 10 MG/ML (PF) SYRINGE
PREFILLED_SYRINGE | INTRAVENOUS | Status: DC | PRN
  Administered 2020-06-15: 100 mg via INTRAVENOUS

## 2020-06-15 MED ORDER — DEXAMETHASONE SODIUM PHOSPHATE 10 MG/ML IJ SOLN
INTRAMUSCULAR | Status: DC | PRN
  Administered 2020-06-15: 10 mg via INTRAVENOUS

## 2020-06-15 MED ORDER — AMLODIPINE BESYLATE 2.5 MG PO TABS
2.5000 mg | ORAL_TABLET | Freq: Every day | ORAL | Status: DC
  Administered 2020-06-15: 2.5 mg via ORAL
  Filled 2020-06-15: qty 1

## 2020-06-15 MED ORDER — ESMOLOL HCL 100 MG/10ML IV SOLN
INTRAVENOUS | Status: DC | PRN
  Administered 2020-06-15: 50 mg via INTRAVENOUS

## 2020-06-15 MED ORDER — SODIUM CHLORIDE 0.9 % IV SOLN: 250.0000 mL | INTRAVENOUS | Status: DC | PRN

## 2020-06-15 MED ORDER — ASPIRIN EC 81 MG PO TBEC
81.0000 mg | DELAYED_RELEASE_TABLET | Freq: Every day | ORAL | Status: DC
  Administered 2020-06-15 – 2020-06-22 (×8): 81 mg via ORAL
  Filled 2020-06-15 (×8): qty 1

## 2020-06-15 MED ORDER — ROCURONIUM BROMIDE 10 MG/ML (PF) SYRINGE
PREFILLED_SYRINGE | INTRAVENOUS | Status: AC
  Filled 2020-06-15: qty 20

## 2020-06-15 MED ORDER — SEVELAMER CARBONATE 800 MG PO TABS
3200.0000 mg | ORAL_TABLET | Freq: Three times a day (TID) | ORAL | Status: DC
  Administered 2020-06-16 – 2020-06-22 (×16): 3200 mg via ORAL
  Filled 2020-06-15 (×16): qty 4

## 2020-06-15 MED ORDER — SODIUM CHLORIDE 0.9% FLUSH: 3.0000 mL | Freq: Two times a day (BID) | INTRAVENOUS | Status: DC

## 2020-06-15 MED ORDER — LIDOCAINE HCL (PF) 1 % IJ SOLN
INTRAMUSCULAR | Status: AC
  Filled 2020-06-15: qty 30

## 2020-06-15 MED ORDER — PANTOPRAZOLE SODIUM 40 MG PO TBEC
40.0000 mg | DELAYED_RELEASE_TABLET | Freq: Every day | ORAL | Status: DC
  Administered 2020-06-15: 40 mg via ORAL

## 2020-06-15 MED ORDER — BISACODYL 5 MG PO TBEC: 5.0000 mg | DELAYED_RELEASE_TABLET | Freq: Every day | ORAL | Status: DC | PRN

## 2020-06-15 MED ORDER — BISMUTH SUBSALICYLATE 262 MG PO CHEW
524.0000 mg | CHEWABLE_TABLET | ORAL | Status: DC | PRN
  Filled 2020-06-15: qty 2

## 2020-06-15 MED ORDER — ACETAMINOPHEN 10 MG/ML IV SOLN: 1000.0000 mg | Freq: Once | INTRAVENOUS | Status: DC | PRN

## 2020-06-15 MED ORDER — FENTANYL CITRATE (PF) 100 MCG/2ML IJ SOLN
INTRAMUSCULAR | Status: DC | PRN
  Administered 2020-06-15: 100 ug via INTRAVENOUS

## 2020-06-15 MED ORDER — SODIUM ZIRCONIUM CYCLOSILICATE 10 G PO PACK
10.0000 g | PACK | Freq: Three times a day (TID) | ORAL | Status: DC
  Administered 2020-06-15: 10 g via ORAL
  Filled 2020-06-15 (×4): qty 1

## 2020-06-15 MED ORDER — ESMOLOL HCL 100 MG/10ML IV SOLN
INTRAVENOUS | Status: AC
  Filled 2020-06-15: qty 10

## 2020-06-15 MED ORDER — DOCUSATE SODIUM 100 MG PO CAPS
100.0000 mg | ORAL_CAPSULE | Freq: Every day | ORAL | Status: DC
  Administered 2020-06-16 – 2020-06-22 (×7): 100 mg via ORAL
  Filled 2020-06-15 (×7): qty 1

## 2020-06-15 MED ORDER — DILTIAZEM HCL-DEXTROSE 125-5 MG/125ML-% IV SOLN (PREMIX)
5.0000 mg/h | INTRAVENOUS | Status: AC
  Filled 2020-06-15: qty 125

## 2020-06-15 MED ORDER — ACETAMINOPHEN 160 MG/5ML PO SOLN: 1000.0000 mg | Freq: Once | ORAL | Status: DC | PRN

## 2020-06-15 MED ORDER — HEPARIN SODIUM (PORCINE) 1000 UNIT/ML IJ SOLN
INTRAMUSCULAR | Status: AC
  Filled 2020-06-15: qty 1

## 2020-06-15 MED ORDER — ETOMIDATE 2 MG/ML IV SOLN
INTRAVENOUS | Status: DC | PRN
  Administered 2020-06-15: 20 mg via INTRAVENOUS

## 2020-06-15 MED ORDER — ROSUVASTATIN CALCIUM 5 MG PO TABS: 10.0000 mg | ORAL_TABLET | Freq: Every day | ORAL | Status: DC

## 2020-06-15 MED ORDER — OXYCODONE-ACETAMINOPHEN 5-325 MG PO TABS
1.0000 | ORAL_TABLET | ORAL | Status: DC | PRN
  Administered 2020-06-16: 1 via ORAL
  Filled 2020-06-15: qty 1

## 2020-06-15 MED ORDER — ACETAMINOPHEN 500 MG PO TABS: 1000.0000 mg | ORAL_TABLET | Freq: Once | ORAL | Status: DC | PRN

## 2020-06-15 MED ORDER — LIDOCAINE HCL (PF) 2 % IJ SOLN
INTRAMUSCULAR | Status: AC
  Filled 2020-06-15: qty 5

## 2020-06-15 MED ORDER — LABETALOL HCL 5 MG/ML IV SOLN
INTRAVENOUS | Status: DC | PRN
  Administered 2020-06-15: 10 mg via INTRAVENOUS

## 2020-06-15 MED ORDER — ALBUMIN HUMAN 5 % IV SOLN: INTRAVENOUS | Status: DC | PRN

## 2020-06-15 MED ORDER — ALBUTEROL SULFATE HFA 108 (90 BASE) MCG/ACT IN AERS
2.0000 | INHALATION_SPRAY | RESPIRATORY_TRACT | Status: DC | PRN
  Filled 2020-06-15: qty 6.7

## 2020-06-15 MED ORDER — CLOPIDOGREL BISULFATE 75 MG PO TABS
75.0000 mg | ORAL_TABLET | Freq: Every day | ORAL | Status: DC
  Administered 2020-06-15 – 2020-06-17 (×3): 75 mg via ORAL
  Filled 2020-06-15 (×2): qty 1

## 2020-06-15 SURGICAL SUPPLY — 20 items
CATH CROSS OVER TEMPO 5F (CATHETERS) ×1 IMPLANT
CATH OMNI FLUSH 5F 65CM (CATHETERS) ×1 IMPLANT
CLOSURE MYNX CONTROL 6F/7F (Vascular Products) ×1 IMPLANT
DEVICE TORQUE .025-.038 (MISCELLANEOUS) ×1 IMPLANT
FEM STOP ARCH (HEMOSTASIS) ×3
GLIDEWIRE ADV .035X260CM (WIRE) ×1 IMPLANT
KIT ENCORE 26 ADVANTAGE (KITS) ×1 IMPLANT
KIT MICROPUNCTURE NIT STIFF (SHEATH) ×1 IMPLANT
KIT PV (KITS) ×3 IMPLANT
SHEATH PINNACLE 5F 10CM (SHEATH) ×1 IMPLANT
SHEATH PINNACLE 7F 10CM (SHEATH) ×1 IMPLANT
SHEATH PINNACLE MP 7F 45CM (SHEATH) ×1 IMPLANT
SHEATH PINNACLE ST 6F 45CM (SHEATH) IMPLANT
SHEATH PROBE COVER 6X72 (BAG) ×1 IMPLANT
STENT EXPRESS LD 9X37X75 (Permanent Stent) ×1 IMPLANT
SYR MEDRAD MARK V 150ML (SYRINGE) ×1 IMPLANT
SYSTEM COMPRESSION FEMOSTOP (HEMOSTASIS) IMPLANT
TRANSDUCER W/STOPCOCK (MISCELLANEOUS) ×3 IMPLANT
TRAY PV CATH (CUSTOM PROCEDURE TRAY) ×3 IMPLANT
WIRE BENTSON .035X145CM (WIRE) ×1 IMPLANT

## 2020-06-15 SURGICAL SUPPLY — 29 items
ADH SKN CLS APL DERMABOND .7 (GAUZE/BANDAGES/DRESSINGS) ×1
CANISTER SUCT 3000ML PPV (MISCELLANEOUS) ×2 IMPLANT
CLIP VESOCCLUDE MED 24/CT (CLIP) ×2 IMPLANT
CLIP VESOCCLUDE SM WIDE 24/CT (CLIP) ×2 IMPLANT
DERMABOND ADVANCED (GAUZE/BANDAGES/DRESSINGS) ×1
DERMABOND ADVANCED .7 DNX12 (GAUZE/BANDAGES/DRESSINGS) ×1 IMPLANT
GAUZE SPONGE 4X4 12PLY STRL LF (GAUZE/BANDAGES/DRESSINGS) ×1 IMPLANT
GLOVE BIO SURGEON STRL SZ7.5 (GLOVE) ×2 IMPLANT
GOWN STRL REUS W/ TWL LRG LVL3 (GOWN DISPOSABLE) ×3 IMPLANT
GOWN STRL REUS W/ TWL XL LVL3 (GOWN DISPOSABLE) ×2 IMPLANT
GOWN STRL REUS W/TWL LRG LVL3 (GOWN DISPOSABLE) ×6
GOWN STRL REUS W/TWL XL LVL3 (GOWN DISPOSABLE) ×4
HEMOSTAT SNOW SURGICEL 2X4 (HEMOSTASIS) ×1 IMPLANT
KIT BASIN OR (CUSTOM PROCEDURE TRAY) ×2 IMPLANT
KIT TURNOVER KIT B (KITS) ×2 IMPLANT
NS IRRIG 1000ML POUR BTL (IV SOLUTION) ×2 IMPLANT
PACK PERIPHERAL VASCULAR (CUSTOM PROCEDURE TRAY) ×2 IMPLANT
PAD ARMBOARD 7.5X6 YLW CONV (MISCELLANEOUS) ×2 IMPLANT
STAPLER VISISTAT 35W (STAPLE) ×1 IMPLANT
SUT MNCRL AB 4-0 PS2 18 (SUTURE) ×2 IMPLANT
SUT PROLENE 5 0 C 1 24 (SUTURE) ×4 IMPLANT
SUT PROLENE 6 0 BV (SUTURE) ×2 IMPLANT
SUT VIC AB 2-0 CT1 27 (SUTURE) ×2
SUT VIC AB 2-0 CT1 TAPERPNT 27 (SUTURE) ×1 IMPLANT
SUT VIC AB 3-0 SH 27 (SUTURE) ×2
SUT VIC AB 3-0 SH 27X BRD (SUTURE) ×1 IMPLANT
TAPE CLOTH SURG 4X10 WHT LF (GAUZE/BANDAGES/DRESSINGS) ×1 IMPLANT
TOWEL GREEN STERILE (TOWEL DISPOSABLE) ×2 IMPLANT
WATER STERILE IRR 1000ML POUR (IV SOLUTION) ×2 IMPLANT

## 2020-06-15 NOTE — Progress Notes (Signed)
   Patient evaluated in postoperative holding for scrotal hematoma.  Appears to have significant increase in the size of his scrotum since the procedure.  He is in significant discomfort.  Remains hemodynamically stable.  I discussed with him proceeding to the operating room for evacuation of hematoma and primary repair of his artery which she agrees to proceed with.   Mayara Paulson C. Donzetta Matters, MD Vascular and Vein Specialists of [place] Office: [phone] Pager: [phone]

## 2020-06-15 NOTE — Interval H&P Note (Signed)
History and Physical Interval Note:  06/15/2020 10:41 AM  Raymond [name]  has presented today for surgery, with the diagnosis of PAD.  The various methods of treatment have been discussed with the patient and family. After consideration of risks, benefits and other options for treatment, the patient has consented to  Procedure(s): ABDOMINAL AORTOGRAM W/LOWER EXTREMITY (Bilateral) as a surgical intervention.  The patient's history has been reviewed, patient examined, no change in status, stable for surgery.  I have reviewed the patient's chart and labs.  Questions were answered to the patient's satisfaction.     Servando Snare

## 2020-06-15 NOTE — Anesthesia Preprocedure Evaluation (Signed)
Anesthesia Evaluation  Patient identified by MRN, date of birth, ID band Patient awake    Reviewed: Allergy & Precautions, NPO status , Patient's Chart, lab work & pertinent test results, Unable to perform ROS - Chart review only  Airway Mallampati: II  TM Distance: >3 FB Neck ROM: Full    Dental no notable dental hx. (+) Missing, Dental Advisory Given,    Pulmonary COPD, Current Smoker,    Pulmonary exam normal breath sounds clear to auscultation       Cardiovascular hypertension, Pt. on medications + CAD and + Peripheral Vascular Disease   Rhythm:Irregular Rate:Tachycardia  10/19 Echo Left ventricle: Wall thickness was increased in a pattern of  severe LVH. Systolic function was normal. The estimated ejection  fraction was in the range of 60% to 65%. Although no diagnostic  regional wall motion abnormality was identified, this possibility  cannot be completely excluded on the basis of this study. The  study was not technically sufficient to allow evaluation of LV  diastolic dysfunction due to atrial fibrillation.  - Aortic valve: There was no stenosis   Neuro/Psych negative neurological ROS  negative psych ROS   GI/Hepatic (+)     substance abuse  , Hepatitis -, C  Endo/Other    Renal/GU ESRF and DialysisRenal diseaseTTS Dialysis     Musculoskeletal   Abdominal   Peds  Hematology  (+) Blood dyscrasia, anemia , Lab Results      Component                Value               Date                      WBC                      2.4 (L)             01/16/2020                HGB                      8.0 (L)             01/16/2020                HCT                      24.9 (L)            01/16/2020                MCV                      97.3                01/16/2020                PLT                      128 (L)             01/16/2020              Anesthesia Other Findings Pt S/P  aortogram/agngiogram for continued leg pain after FP BPG in 2019.  Received from [facility] lab following procedure due to persistent bleeding and hematoma at puncture site and groin.    Reproductive/Obstetrics  Anesthesia Physical  Anesthesia Plan  ASA: III and emergent  Anesthesia Plan: General   Post-op Pain Management:    Induction: Intravenous, Rapid sequence and Cricoid pressure planned  PONV Risk Score and Plan: 2 and Treatment may vary due to age or medical condition and Midazolam  Airway Management Planned: Oral ETT  Additional Equipment: Arterial line  Intra-op Plan:   Post-operative Plan: Extubation in OR  Informed Consent: I have reviewed the patients History and Physical, chart, labs and discussed the procedure including the risks, benefits and alternatives for the proposed anesthesia with the patient or authorized representative who has indicated his/her understanding and acceptance.     Dental advisory given  Plan Discussed with: Anesthesiologist and CRNA  Anesthesia Plan Comments:         Anesthesia Quick Evaluation

## 2020-06-15 NOTE — Op Note (Signed)
Patient name: Raymond [name] MRN: [id number] DOB: 03-Jan-1956 Sex: male  06/15/2020 Pre-operative Diagnosis: Left groin and scrotal hematoma Post-operative diagnosis:  Same Surgeon:  Apolinar Junes C. Randie Heinz, MD Assistant: [name] Gallant, PA Procedure Performed: 1.  Exposure left common femoral artery greater than 30 days 2.  Evacuation of left groin and scrotal hematoma 3.  Primary repair left common femoral artery cannulation site  Indications: 64 year old male with end-stage renal disease underwent right common and external leg artery stenting earlier today.  Subsequently developed extensive scrotal hematoma on the left with presumed failure of the minx device.  He was subsequently taken to the operating room for exploration and primary repair of the artery.  Assistant was necessary for suction, retraction, assistance with arterial repair and evacuation of hematoma and wound closure.  Findings: Cannulation site was on the anterior surface of the artery just at the level of the inguinal ligament.  The minx device was stuck in the soft tissue above the inguinal ligament.  The artery was primarily repaired and there was good signal distally.  Significant hematoma was evacuated from the patient's inguinal canal and scrotum.   Procedure:  The patient was identified in the holding area and taken to the operating room where is placed supine operative table and clearly prepped draped in left groin usual fashion.  Antibiotics were administered timeout was called.  We Began by reopening his previous incision.  I dissected down to the inguinal ligament.  Just at this level I encountered a minx device.  I then dissected up onto the inguinal ligament I was able to bluntly dissect out the common femoral and external iliac artery there find one soft area and I clamped it with a Henley clamp.  We then identified the bleeding area on the anterior surface of the artery and this was repaired with 2 interrupted 5-0 Prolene  sutures.  Clamp was taken off.  There was good signal distally in the wound.  We then irrigated down into his inguinal canal and scrotum and remove significant hematoma.  The wound was thoroughly irrigated.  We then closed with interrupted 2-0 Vicryl and staples were placed to the level of the skin.  He was awakened from anesthesia having tolerated procedure without any complication.  All counts were correct at completion.  EBL: 50 cc   Raesha Coonrod C. Randie Heinz, MD [facility]in Specialists of [place] Office: [phone] Pager: [phone]

## 2020-06-15 NOTE — Transfer of Care (Signed)
Immediate Anesthesia Transfer of Care Note  Patient: Raymond [name]  Procedure(s) Performed: GROIN  EXPLORATION (Left )  Patient Location: PACU  Anesthesia Type:General  Level of Consciousness: drowsy and patient cooperative  Airway & Oxygen Therapy: Patient Spontanous Breathing  Post-op Assessment: Report given to RN, Post -op Vital signs reviewed and stable and Patient moving all extremities X 4  Post vital signs: Reviewed and stable  Last Vitals:  Vitals Value Taken Time  BP 114/83 06/15/20 1647  Temp    Pulse 85 06/15/20 1649  Resp 15 06/15/20 1649  SpO2 89 % 06/15/20 1649  Vitals shown include unvalidated device data.  Last Pain:  Vitals:   06/15/20 1511  TempSrc:   PainSc: 10-Worst pain ever         Complications: No complications documented.

## 2020-06-15 NOTE — Anesthesia Procedure Notes (Signed)
Procedure Name: Intubation Date/Time: 06/15/2020 3:33 PM Performed by: Darletta Moll, CRNA Pre-anesthesia Checklist: Patient identified, Emergency Drugs available, Suction available and Patient being monitored Patient Re-evaluated:Patient Re-evaluated prior to induction Oxygen Delivery Method: Circle system utilized Preoxygenation: Pre-oxygenation with 100% oxygen Induction Type: IV induction, Rapid sequence and Cricoid Pressure applied Ventilation: Mask ventilation without difficulty Laryngoscope Size: Mac and 4 Grade View: Grade I Tube type: Oral Tube size: 7.5 mm Number of attempts: 1 Airway Equipment and Method: Stylet Placement Confirmation: ETT inserted through vocal cords under direct vision,  positive ETCO2 and breath sounds checked- equal and bilateral Secured at: 22 cm Tube secured with: Tape Dental Injury: Teeth and Oropharynx as per pre-operative assessment

## 2020-06-15 NOTE — Anesthesia Procedure Notes (Addendum)
Arterial Line Insertion Start/End12/12/2019 3:49 PM Performed by: Darletta Moll, CRNA, CRNA  Patient location: OR. Preanesthetic checklist: patient identified, IV checked, site marked, risks and benefits discussed, surgical consent, monitors and equipment checked, pre-op evaluation, timeout performed and anesthesia consent Lidocaine 1% used for infiltration Right, radial was placed Catheter size: 20 Fr Hand hygiene performed  and maximum sterile barriers used   Attempts: 1 Procedure performed without using ultrasound guided technique. Following insertion, dressing applied and Biopatch. Post procedure assessment: normal and unchanged  Patient tolerated the procedure well with no immediate complications.

## 2020-06-15 NOTE — Progress Notes (Signed)
BP 90/60 manual pressure Lt DP pulses found by doppler  Report given to Bernestine Amass by [employer]. Pt coming from [facility] for Abd. aio run off. Access was at left femoral site. At hand off, it was noted that the patient had scrotal edema. The patient had a fem-stop placed over left femoral site, but was not inflated, per MD. Swelling was measured post procedure at 31cm circumference  At 1305, swelling measured at 34cm circumference.   Swelling continued upon reassessment and MD notified.  Scrotal circumference measured at 38cm with increased swelling noted to the patient's penis.    MD assessed patient and obtained consent for surgery. Bilateral DP pulses verified by doppler. Pt transported to OR by [employer]

## 2020-06-15 NOTE — Consult Note (Addendum)
ESRD Consult Note [facility]  Requesting provider: Thomes Lolling*  Outpatient dialysis unit: [facility] Outpatient dialysis schedule: TTS  Assessment/Recommendations:  ESRD:  -outpatient orders: 4hrs, F180, 450/800, 2K, 2Ca, 137Na, 35Bicarb -UF Profile 4 -no heparin  Hyperkalemia -lokelma 10g tid for now, HD tomorrow in AM, limit K in diet  Volume/ hypertension: EDW 69.5kg. Attempt to achieve EDW as tolerated  Anemia of Chronic Kidney Disease: Hemoglobin 13.9. Currently receiving mircera 100 mcg q2weeks (last dose 11/24) and qweekly venofer 50mg  last dose 12/2.   Secondary Hyperparathyroidism/Hyperphosphatemia: sensipar 180 qtreatment and calcitriol 1.33mcg qtreatment   Vascular access: LUE AVF +b/t  PAD  -s/p right common and external leg artery stenting earlier today with extesnive scrotal hematoma secondary to presumed failure of minx device s/p exploration and primary repair -mgmt per vascular  # Additional recommendations: - Dose all meds for creatinine clearance < 10 ml/min  - Unless absolutely necessary, no MRIs with gadolinium.  - Implement save arm precautions.  Prefer needle sticks in the dorsum of the hands or wrists.  No blood pressure measurements in arm. - If blood transfusion is requested during hemodialysis sessions, please alert [place] prior to the session.  - If a hemodialysis catheter line culture is requested, please alert [place] as only hemodialysis nurses are able to collect those specimens.   Gean Quint, MD [facility]   History of Present Illness: Raymond [name] is a/an 64 y.o. male with a past medical history of ESRD on HD (nonadherent to treatment regimen), HTN, CAD, pAfib (not on A/C), h/o Hep C, anemia of chronic kidney disease, PAD s/p left fem-pop bypass graft in 05/2018, COPD, h/o intestinal AVMs, ongoing cocaine and tobacco use who presents for right common and external iliac artery stent and eventually evacuation of  left groin and scrotal hematoma and repair left common femoral artery cannulation site today.  Last HD 06/11/20 (did not attend his Sat treatment). Other than some pain, he states that he feels okay.  Denies any fever, chills, chest pain, shortness of breath.  Denies any issues with his fistula recently. Is anuric.   Medications:  Current Facility-Administered Medications  Medication Dose Route Frequency Provider Last Rate Last Admin  . diltiazem (CARDIZEM) 125 mg in dextrose 5% 125 mL (1 mg/mL) infusion  5-15 mg/hr Intravenous To OR Duane Boston, MD      . HYDROmorphone (DILAUDID) injection 0.5-1 mg  0.5-1 mg Intravenous Q2H PRN Waynetta Sandy, MD   0.5 mg at 06/15/20 1453  . sodium zirconium cyclosilicate (LOKELMA) packet 10 g  10 g Oral TID Gean Quint, MD       Facility-Administered Medications Ordered in Other Encounters  Medication Dose Route Frequency Provider Last Rate Last Admin  . midazolam (VERSED) 5 MG/5ML injection    Anesthesia Intra-op Sammie Bench, CRNA   2 mg at 04/11/18 1042     ALLERGIES Patient has no known allergies.  MEDICAL HISTORY Past Medical History:  Diagnosis Date  . Anemia   . CAD (coronary artery disease)    a. 03/2018: cath showing 20 to 30% stenosis along the LAD, luminal irregularities along the RCA, and angiographically normal LCx  . COPD (chronic obstructive pulmonary disease) ([place])   . Dyspnea    "with too much fluid"  . ESRD (end stage renal disease) on dialysis [facility])    "MWF; [facility] Rinks" (07/22/17)  . Hemodialysis patient ([place])   . Hepatitis C    Still positive s/p liver biopsy at [facility]  and interferon therapy for 6 months. Most recent lab work was on 10/24/12  . Hepatitis C    "took the tx; gone now" (12/05/2016)  Was treated  . History of blood transfusion ~ 2012/2013   "related to my kidneys; blood was low"  . Hypertension   . Peripheral arterial disease ([place])   . Substance abuse ([place])   . Thyroid disease       SOCIAL HISTORY Social History   Socioeconomic History  . Marital status: Single    Spouse name: Not on file  . Number of children: Not on file  . Years of education: Not on file  . Highest education level: Not on file  Occupational History  . Not on file  Tobacco Use  . Smoking status: Current Every Day Smoker    Packs/day: 0.50    Years: 25.00    Pack years: 12.50    Types: Cigarettes    Last attempt to quit: 08/01/2011    Years since quitting: 8.8  . Smokeless tobacco: Never Used  . Tobacco comment: he smokes 6 cigarettes a week  Vaping Use  . Vaping Use: Never used  Substance and Sexual Activity  . Alcohol use: Yes    Comment: occasionally/ one half pint  . Drug use: Not Currently    Types: Cocaine  . Sexual activity: Not on file  Other Topics Concern  . Not on file  Social History Narrative   Pt. Is homeless.  He lives in shelters and motels when he can afford.     Social Determinants of Health   Financial Resource Strain:   . Difficulty of Paying Living Expenses: Not on file  Food Insecurity:   . Worried About Charity fundraiser in the Last Year: Not on file  . Ran Out of Food in the Last Year: Not on file  Transportation Needs:   . Lack of Transportation (Medical): Not on file  . Lack of Transportation (Non-Medical): Not on file  Physical Activity:   . Days of Exercise per Week: Not on file  . Minutes of Exercise per Session: Not on file  Stress:   . Feeling of Stress : Not on file  Social Connections:   . Frequency of Communication with Friends and Family: Not on file  . Frequency of Social Gatherings with Friends and Family: Not on file  . Attends Religious Services: Not on file  . Active Member of Clubs or Organizations: Not on file  . Attends Archivist Meetings: Not on file  . Marital Status: Not on file  Intimate Partner Violence:   . Fear of Current or Ex-Partner: Not on file  . Emotionally Abused: Not on file  . Physically Abused:  Not on file  . Sexually Abused: Not on file     FAMILY HISTORY Family History  Problem Relation Age of Onset  . Diabetes Father   . Hypertension Father   . Heart disease Father      Review of Systems: 12 systems were reviewed and negative except per HPI  Physical Exam: Vitals:   06/15/20 1732 06/15/20 1827  BP: 119/83 118/83  Pulse: 75 (!) 104  Resp:  16  Temp:  98.4 F (36.9 C)  SpO2: 94% 100%   Total I/O In: 1400 [I.V.:900; IV Piggyback:500] Out: 50 [Blood:50]  Intake/Output Summary (Last 24 hours) at 06/15/2020 1832 Last data filed at 06/15/2020 1623 Gross per 24 hour  Intake 1400 ml  Output 50 ml  Net  1350 ml   General: well-appearing, no acute distress, laying flat in bed HEENT: anicteric sclera, MMM CV: normal rate, no murmurs, no edema Lungs: cta bl, bilateral chest rise, normal wob Abd: soft, non-tender, non-distended Skin: no visible lesions or rashes MSK: no edema, LUE AVF +b/t Neuro: slightly drowsy but awake, normal speech, no gross focal deficits   Test Results Reviewed Lab Results  Component Value Date   NA 135 06/15/2020   K 5.9 (H) 06/15/2020   CL 95 (L) 06/15/2020   CO2 27 01/18/2020   BUN 91 (H) 06/15/2020   CREATININE 15.40 (H) 06/15/2020   CALCIUM 8.8 (L) 01/18/2020   ALBUMIN 2.5 (L) 01/18/2020   PHOS 4.9 (H) 01/18/2020    I have reviewed relevant outside healthcare records

## 2020-06-15 NOTE — Op Note (Signed)
Patient name: Raymond [name] MRN: [id number] DOB: [birth year] Sex: male  06/15/2020 Pre-operative Diagnosis: Right lower extremity claudication Post-operative diagnosis:  Same Surgeon:  Apolinar Junes C. Randie Heinz, MD Procedure Performed: 1.  Ultrasound-guided cannulation left common femoral artery 2.  Aortogram with bilateral lower extremity runoff 3.  Stent of right common and external iliac arteries with 9 x 37 mm biliary express balloon expandable stent 4.  Minx device closure left common femoral artery 5.  Moderate sedation with fentanyl and Versed for 40 minutes  Indications: 64 year old male with esrd and previous left common femoral to below-knee popliteal artery bypass.  He now has significant life limiting claudication on the right he is indicated for aortogram possible intervention.  Findings: His aorta bilateral common iliac arteries were heavily calcified.  On the right side his common down to his external was subtotally occluded with approximately 95% stenosis.  After stenting this is resolved to less than 20% stenosis there is brisk flow through the stent although some of the viewing is obstructed with calcium.  There was some flow that remained in the hypogastric artery on the right after stenting across it with a noncovered stent.  On the left side there is significant calcium and although wire was difficult to pass there was no gradient across the lesion.  The left common femoral bypass is patent I cannot see the distal anastomosis he appears to have runoff via the anterior tibial and peroneal arteries.  On the right side which is the site of interest he is a flush occlusion of the SFA profunda fills the thigh reconstitutes below-knee popliteal artery where he is 3 diminutive vessels running off to the ankle.  Patient will be revisited in the office to evaluate for improvement in symptoms.  If he continues to have severe life limiting claudication we will plan for right common femoral to  below-knee popliteal artery bypass.  I will have him follow-up with saphenous vein mapping in approximately 3 to 4 weeks.   Procedure:  The patient was identified in the holding area and taken to room 8.  The patient was then placed supine on the table and prepped and draped in the usual sterile fashion.  A time out was called.  Ultrasound was used to evaluate the left common femoral artery.  There was a bypass there the artery with itself was quite large.  The area was anesthetized 1% lidocaine.  There was significant scar tissue.  The artery itself was patent.  We cannulated with micropuncture needle followed the wire and sheath.  We had difficulty passing a wire we did this under fluoroscopic guidance placed a micropuncture sheath followed by Bentson wire.  Is 5 French sheath was placed.  We had to use a Glidewire advantage to get into the aorta and then placed an Omni catheter performed aortogram followed by pelvic views with LAO projections followed by bilateral lower extremity runoff with the above findings.  I then used a crossover catheter and Glidewire advantage to cross the subtotally occluded right common extending and external iliac arteries.  The patient was fully heparinized we placed a long 7 [place] sheath.  We then placed our 9 x 37 mm stent and placed retracted our sheath performed angiography and deployed our stent.  Completion demonstrated no residual stenosis.  We then did a pullback gradient across the left common iliac artery there was no significant gradient.  We exchanged for a short 7 [place] sheath deployed a minx device.  Unfortunately the patient  developed significant scrotal swelling.  He remained hemodynamically stable.  Patient was already planned to be admitted overnight due to lack of transportation and family support and now will be admitted to watch his scrotal edema as well.  He otherwise tolerated the procedure well.  Contrast: 150cc  Kadarius Cuffe C. Randie Heinz, MD [facility] [place] Office: [phone] Pager: [phone]

## 2020-06-15 NOTE — Progress Notes (Signed)
Patient fed.

## 2020-06-15 NOTE — Progress Notes (Signed)
Spoke with Dr. Oneida Alar re: order for Plavix 300 mg post cath.  Order received to give only 75 mg Plavix tonight instead of 300 mg, in light of pt's hematoma post cath.  Jodell Cipro

## 2020-06-16 ENCOUNTER — Encounter (HOSPITAL_COMMUNITY): Payer: Self-pay | Admitting: Vascular Surgery

## 2020-06-16 DIAGNOSIS — I483 Typical atrial flutter: Secondary | ICD-10-CM

## 2020-06-16 LAB — POCT I-STAT, CHEM 8
BUN: 46 mg/dL — ABNORMAL HIGH (ref 8–23)
Calcium, Ion: 0.74 mmol/L — CL (ref 1.15–1.40)
Chloride: 108 mmol/L (ref 98–111)
Creatinine, Ser: 9.6 mg/dL — ABNORMAL HIGH (ref 0.61–1.24)
Glucose, Bld: 76 mg/dL (ref 70–99)
HCT: 15 % — ABNORMAL LOW (ref 39.0–52.0)
Hemoglobin: 5.1 g/dL — CL (ref 13.0–17.0)
Potassium: 3.5 mmol/L (ref 3.5–5.1)
Sodium: 140 mmol/L (ref 135–145)
TCO2: 18 mmol/L — ABNORMAL LOW (ref 22–32)

## 2020-06-16 LAB — BASIC METABOLIC PANEL
Anion gap: 14 (ref 5–15)
BUN: 75 mg/dL — ABNORMAL HIGH (ref 8–23)
CO2: 26 mmol/L (ref 22–32)
Calcium: 7.9 mg/dL — ABNORMAL LOW (ref 8.9–10.3)
Chloride: 95 mmol/L — ABNORMAL LOW (ref 98–111)
Creatinine, Ser: 16.86 mg/dL — ABNORMAL HIGH (ref 0.61–1.24)
GFR, Estimated: 3 mL/min — ABNORMAL LOW (ref >60)
Glucose, Bld: 177 mg/dL — ABNORMAL HIGH (ref 70–99)
Potassium: 6.8 mmol/L (ref 3.5–5.1)
Sodium: 135 mmol/L (ref 135–145)

## 2020-06-16 LAB — CBC
HCT: 23.8 % — ABNORMAL LOW (ref 39.0–52.0)
HCT: 25.1 % — ABNORMAL LOW (ref 39.0–52.0)
Hemoglobin: 7.4 g/dL — ABNORMAL LOW (ref 13.0–17.0)
Hemoglobin: 8.3 g/dL — ABNORMAL LOW (ref 13.0–17.0)
MCH: 33.8 pg (ref 26.0–34.0)
MCH: 33.9 pg (ref 26.0–34.0)
MCHC: 31.1 g/dL (ref 30.0–36.0)
MCHC: 33.1 g/dL (ref 30.0–36.0)
MCV: 108.7 fL — ABNORMAL HIGH (ref 80.0–100.0)
MCV: 102.4 fL — ABNORMAL HIGH (ref 80.0–100.0)
Platelets: 90 10*3/uL — ABNORMAL LOW (ref 150–400)
Platelets: 42 10*3/uL — ABNORMAL LOW (ref 150–400)
RBC: 2.19 MIL/uL — ABNORMAL LOW (ref 4.22–5.81)
RBC: 2.45 MIL/uL — ABNORMAL LOW (ref 4.22–5.81)
RDW: 16.5 % — ABNORMAL HIGH (ref 11.5–15.5)
RDW: 19.4 % — ABNORMAL HIGH (ref 11.5–15.5)
WBC: 2.7 10*3/uL — ABNORMAL LOW (ref 4.0–10.5)
WBC: 3.1 10*3/uL — ABNORMAL LOW (ref 4.0–10.5)
nRBC: 0 % (ref 0.0–0.2)
nRBC: 0 % (ref 0.0–0.2)

## 2020-06-16 LAB — POCT I-STAT 7, (LYTES, BLD GAS, ICA,H+H)
Acid-Base Excess: 3 mmol/L — ABNORMAL HIGH (ref 0.0–2.0)
Bicarbonate: 29.9 mmol/L — ABNORMAL HIGH (ref 20.0–28.0)
Calcium, Ion: 1.03 mmol/L — ABNORMAL LOW (ref 1.15–1.40)
HCT: 27 % — ABNORMAL LOW (ref 39.0–52.0)
Hemoglobin: 9.2 g/dL — ABNORMAL LOW (ref 13.0–17.0)
O2 Saturation: 100 %
Patient temperature: 35.6
Potassium: 5.9 mmol/L — ABNORMAL HIGH (ref 3.5–5.1)
Sodium: 135 mmol/L (ref 135–145)
TCO2: 31 mmol/L (ref 22–32)
pCO2 arterial: 51.4 mmHg — ABNORMAL HIGH (ref 32.0–48.0)
pH, Arterial: 7.365 (ref 7.350–7.450)
pO2, Arterial: 521 mmHg — ABNORMAL HIGH (ref 83.0–108.0)

## 2020-06-16 LAB — HEMOGLOBIN AND HEMATOCRIT, BLOOD
HCT: 28.5 % — ABNORMAL LOW (ref 39.0–52.0)
Hemoglobin: 9.5 g/dL — ABNORMAL LOW (ref 13.0–17.0)

## 2020-06-16 LAB — GLUCOSE, CAPILLARY: Glucose-Capillary: 187 mg/dL — ABNORMAL HIGH (ref 70–99)

## 2020-06-16 LAB — PREPARE RBC (CROSSMATCH)

## 2020-06-16 MED ORDER — LIDOCAINE HCL (PF) 1 % IJ SOLN: 5.0000 mL | INTRAMUSCULAR | Status: DC | PRN

## 2020-06-16 MED ORDER — SODIUM CHLORIDE 0.9 % IV SOLN: 100.0000 mL | INTRAVENOUS | Status: DC | PRN

## 2020-06-16 MED ORDER — CALCITRIOL 0.5 MCG PO CAPS
ORAL_CAPSULE | ORAL | Status: AC
  Filled 2020-06-16: qty 3

## 2020-06-16 MED ORDER — PENTAFLUOROPROP-TETRAFLUOROETH EX AERO: 1.0000 "application " | INHALATION_SPRAY | CUTANEOUS | Status: DC | PRN

## 2020-06-16 MED ORDER — ALTEPLASE 2 MG IJ SOLR: 2.0000 mg | Freq: Once | INTRAMUSCULAR | Status: DC | PRN

## 2020-06-16 MED ORDER — SODIUM CHLORIDE 0.9 % IV BOLUS
500.0000 mL | Freq: Once | INTRAVENOUS | Status: AC
  Administered 2020-06-16: 500 mL via INTRAVENOUS

## 2020-06-16 MED ORDER — DARBEPOETIN ALFA 60 MCG/0.3ML IJ SOSY
60.0000 ug | PREFILLED_SYRINGE | INTRAMUSCULAR | Status: DC
  Administered 2020-06-16: 60 ug via INTRAVENOUS

## 2020-06-16 MED ORDER — HEPARIN SODIUM (PORCINE) 1000 UNIT/ML DIALYSIS
1000.0000 [IU] | INTRAMUSCULAR | Status: DC | PRN
  Filled 2020-06-16: qty 1

## 2020-06-16 MED ORDER — LIDOCAINE-PRILOCAINE 2.5-2.5 % EX CREA
1.0000 "application " | TOPICAL_CREAM | CUTANEOUS | Status: DC | PRN
  Filled 2020-06-16: qty 5

## 2020-06-16 MED ORDER — DARBEPOETIN ALFA 60 MCG/0.3ML IJ SOSY
PREFILLED_SYRINGE | INTRAMUSCULAR | Status: AC
  Filled 2020-06-16: qty 0.3

## 2020-06-16 MED ORDER — CALCITRIOL 0.5 MCG PO CAPS
1.5000 ug | ORAL_CAPSULE | ORAL | Status: DC
  Administered 2020-06-16 – 2020-06-20 (×3): 1.5 ug via ORAL
  Filled 2020-06-16 (×2): qty 3

## 2020-06-16 MED ORDER — SODIUM CHLORIDE 0.9% IV SOLUTION: Freq: Once | INTRAVENOUS | Status: AC

## 2020-06-16 NOTE — Progress Notes (Addendum)
Progress Note    06/16/2020 8:20 AM 1 Day Post-Op  Subjective:  Lethargy this morning.awakens to voice and follows commands but not completely cooperative. Getting 1st of two units of packed cells. Says he does not make urine.   Vitals:   06/16/20 0634 06/16/20 0754  BP: 100/65 115/76  Pulse: (!) 110 84  Resp: 14 13  Temp: 97.8 F (36.6 C) 97.9 F (36.6 C)  SpO2: 97% 99%    Physical Exam: Cardiac:  RRR Lungs:  nonlabored Incisions:  Left groin well approximated. No edema or bleeding Extremities:  LLE: Motor and sensation intact. Foot warm without skin changes. Doppler unit in room not functioning. (RN was able to use it earlier with positive left DP signal.) Abdomen:  Soft Scrotum is tense, painful to touch. Mild penile edema.  CBC    Component Value Date/Time   WBC 2.7 (L) 06/16/2020 0338   RBC 2.19 (L) 06/16/2020 0338   HGB 7.4 (L) 06/16/2020 0338   HGB 11.0 (L) 03/27/2018 1420   HCT 23.8 (L) 06/16/2020 0338   HCT 32.7 (L) 03/27/2018 1420   PLT 90 (L) 06/16/2020 0338   PLT 174 03/27/2018 1420   MCV 108.7 (H) 06/16/2020 0338   MCV 101 (H) 03/27/2018 1420   MCH 33.8 06/16/2020 0338   MCHC 31.1 06/16/2020 0338   RDW 16.5 (H) 06/16/2020 0338   RDW 14.6 03/27/2018 1420   LYMPHSABS 2.0 01/14/2020 0600   LYMPHSABS 0.9 03/27/2018 1420   MONOABS 0.9 01/14/2020 0600   EOSABS 0.1 01/14/2020 0600   EOSABS 0.1 03/27/2018 1420   BASOSABS 0.0 01/14/2020 0600   BASOSABS 0.0 03/27/2018 1420    BMET    Component Value Date/Time   NA 135 06/16/2020 0338   NA 140 05/15/2018 1632   K 6.8 (HH) 06/16/2020 0338   CL 95 (L) 06/16/2020 0338   CO2 26 06/16/2020 0338   GLUCOSE 177 (H) 06/16/2020 0338   BUN 75 (H) 06/16/2020 0338   BUN 38 (H) 05/15/2018 1632   CREATININE 16.86 (H) 06/16/2020 0338   CALCIUM 7.9 (L) 06/16/2020 0338   GFRNONAA 3 (L) 06/16/2020 0338   GFRAA 6 (L) 01/18/2020 1232     Intake/Output Summary (Last 24 hours) at 06/16/2020 0820 Last data filed  at 06/16/2020 0030 Gross per 24 hour  Intake 1640 ml  Output 50 ml  Net 1590 ml    HOSPITAL MEDICATIONS Scheduled Meds: . amLODipine  2.5 mg Oral QHS  . aspirin EC  81 mg Oral Daily  . Chlorhexidine Gluconate Cloth  6 each Topical Q0600  . clopidogrel  300 mg Oral Once   Followed by  . clopidogrel  75 mg Oral Q breakfast  . diltiazem (CARDIZEM) infusion  5-15 mg/hr Intravenous To OR  . docusate sodium  100 mg Oral Daily  . heparin  5,000 Units Subcutaneous Q8H  . labetalol  200 mg Oral BID  . pantoprazole  40 mg Oral Daily  . rosuvastatin  10 mg Oral Daily  . sevelamer carbonate  3,200 mg Oral TID WC  . sodium chloride flush  3 mL Intravenous Q12H  . sodium zirconium cyclosilicate  10 g Oral TID   Continuous Infusions: . sodium chloride    . sodium chloride     PRN Meds:.sodium chloride, acetaminophen, albuterol, bisacodyl, bismuth subsalicylate, hydrALAZINE, HYDROmorphone (DILAUDID) injection, labetalol, metoprolol tartrate, ondansetron (ZOFRAN) IV, oxyCODONE, QUEtiapine, senna-docusate, sodium chloride flush, sodium phosphate  Assessment: POD 84: 64 year old male with end-stage renal disease underwent right  common and external leg artery stenting yesterday.  Subsequently developed extensive scrotal hematoma on the left with presumed failure of the minx device.  He was subsequently taken to the operating room for exploration and primary repair of the artery. No evidence of bleeding. LLE well perfused.    History of ESRD on IHD. Nephrology consulted/informed of patient's admission.  Anemia: multifactorial and includes acute blood loss. Hgb 9>>7.4 Getting transfused. Heart rate and BP variable without sustained hypotension or tachycardia.  Altered mental status. I do not know his baseline but likely component of metabolic encephalopathy. Expect this will improve with HD.   Plan -continue close observation. HD today. -Continue Plavix and aspirin -attempted to elevate scrotum to  assist with discomfort but he did not tolerate this -DVT prophylaxis:  Heparin [place]   Risa Grill, PA-C [facility] [phone] 06/16/2020  8:20 AM   I have independently interviewed and examined patient and agree with PA assessment and plan above. Scrotum is softer but still has edema and penile swelling. He does not make urine. HD today. 2 units prbc's ordered over night.   Cherie Lasalle C. Donzetta Matters, MD [facility] [place] Office: [phone] Pager: [phone]

## 2020-06-16 NOTE — Progress Notes (Signed)
Patient ID: Raymond [name], male   DOB: 06/15/1956, 64 y.o.   MRN: [id number] Comfortable currently. Went into atrial flutter during hemodialysis.  Patient does have a history of atrial arrhythmias in the past. He says he does not have a cardiologist but had been noted to have some hypotension with dialysis over the last several weeks and there had been plan for cardiology consultation as an outpatient. Currently his heart rate is 110-120.  Systolic blood pressure in the 70s to 80s I have spoken with Dr. Oval Linsey with [facility] who will see him this evening for recommendations

## 2020-06-16 NOTE — Plan of Care (Signed)

## 2020-06-16 NOTE — Plan of Care (Signed)

## 2020-06-16 NOTE — Progress Notes (Signed)
Patient converted to atrial flutter. Rates 90s-120s, mostly sustaining in the low 100s. Patient states that he thinks he has a history of irregular heart rate. Patient is alert and oriented x4. BP 80s/60s, currently 84/60. Extremities are warm and dry. Patient recently returned from HD. Patient still complains of significant scrotal swelling and pain which he states feels unchanged from prior.   Notified Dr. Donnetta Hutching (on call for [facility]) via phone of patient's rhythm change and of current BP.

## 2020-06-16 NOTE — Progress Notes (Addendum)
[facility] NEPHROLOGY PROGRESS NOTE  Assessment/ Plan: Pt is a 64 y.o. yo male with ESRD on HD, anemia who underwent vascular procedure on 12/6, consulted for ESRD management. OP HD orders: [facility], outpatient orders: 4hrs, F180, 450/800, 2K, 2Ca, 137Na, 35Bicarb UF Profile 4, no heparin  #PAD underwent right common and external leg artery stenting on 12/6 by vascular.  Scrotal hematoma, vascular is following.  # ESRD: TTS: Plan for dialysis today.  Blood pressure low.  # Anemia due to ESRD and APL AA during surgery: Drop in hemoglobin.  Continue ESA.  Transfuse blood as needed.  # Secondary hyperparathyroidism: Continue calcitriol, holding Sensipar because of hypercalcemia.  Monitor CEA, phosphorus level.  # HTN/volume: Hypotension therefore discontinue amlodipine.  On labetalol.  UF as tolerated.  #AMS likely due to sedatives, HD today.  Recommend to avoid or at least minimize sedatives.  #Hyperkalemia: Managed with dialysis.  Subjective: Seen and examined at bedside.  Blood pressure low and patient is quite lethargic.  Review of system limited. Objective Vital signs in last 24 hours: Vitals:   06/16/20 0945 06/16/20 1007 06/16/20 1050 06/16/20 1057  BP: 98/66 (!) 87/61 (!) 104/54 105/63  Pulse: 77 87 82 75  Resp: 12  13 14   Temp: 97.7 F (36.5 C)  97.8 F (36.6 C)   TempSrc: Oral  Oral   SpO2: 100% 99% 99% 98%  Weight:   76 kg   Height:       Weight change:   Intake/Output Summary (Last 24 hours) at 06/16/2020 1115 Last data filed at 06/16/2020 0940 Gross per 24 hour  Intake 1955 ml  Output 50 ml  Net 1905 ml       Labs: Basic Metabolic Panel: Recent Labs  Lab 06/15/20 1045 06/15/20 1855 06/16/20 0338  NA 135  --  135  K 5.9*  --  6.8*  CL 95*  --  95*  CO2  --   --  26  GLUCOSE 110*  --  177*  BUN 91*  --  75*  CREATININE 15.40* 17.20* 16.86*  CALCIUM  --   --  7.9*   Liver Function Tests: No results for input(s): AST, ALT, ALKPHOS,  BILITOT, PROT, ALBUMIN in the last 168 hours. No results for input(s): LIPASE, AMYLASE in the last 168 hours. No results for input(s): AMMONIA in the last 168 hours. CBC: Recent Labs  Lab 06/15/20 1045 06/15/20 1855 06/16/20 0338  WBC  --  4.5 2.7*  HGB 13.9 9.0* 7.4*  HCT 41.0 29.6* 23.8*  MCV  --  111.3* 108.7*  PLT  --  101* 90*   Cardiac Enzymes: No results for input(s): CKTOTAL, CKMB, CKMBINDEX, TROPONINI in the last 168 hours. CBG: Recent Labs  Lab 06/15/20 2034 06/15/20 2340 06/16/20 0419  GLUCAP 169* 182* 187*    Iron Studies: No results for input(s): IRON, TIBC, TRANSFERRIN, FERRITIN in the last 72 hours. Studies/Results: PERIPHERAL VASCULAR CATHETERIZATION  Result Date: 06/15/2020 Patient name: Raymond [name] MRN: [id number] DOB: 17-Mar-1956 Sex: male 06/15/2020 Pre-operative Diagnosis: Right lower extremity claudication Post-operative diagnosis:  Same Surgeon:  Apolinar Junes C. Randie Heinz, MD Procedure Performed: 1.  Ultrasound-guided cannulation left common femoral artery 2.  Aortogram with bilateral lower extremity runoff 3.  Stent of right common and external iliac arteries with 9 x 37 mm biliary express balloon expandable stent 4.  Minx device closure left common femoral artery 5.  Moderate sedation with fentanyl and Versed for 40 minutes Indications: 64 year old male with esrd and  previous left common femoral to below-knee popliteal artery bypass.  He now has significant life limiting claudication on the right he is indicated for aortogram possible intervention. Findings: His aorta bilateral common iliac arteries were heavily calcified.  On the right side his common down to his external was subtotally occluded with approximately 95% stenosis.  After stenting this is resolved to less than 20% stenosis there is brisk flow through the stent although some of the viewing is obstructed with calcium.  There was some flow that remained in the hypogastric artery on the right after stenting across  it with a noncovered stent.  On the left side there is significant calcium and although wire was difficult to pass there was no gradient across the lesion.  The left common femoral bypass is patent I cannot see the distal anastomosis he appears to have runoff via the anterior tibial and peroneal arteries.  On the right side which is the site of interest he is a flush occlusion of the SFA profunda fills the thigh reconstitutes below-knee popliteal artery where he is 3 diminutive vessels running off to the ankle. Patient will be revisited in the office to evaluate for improvement in symptoms.  If he continues to have severe life limiting claudication we will plan for right common femoral to below-knee popliteal artery bypass.  I will have him follow-up with saphenous vein mapping in approximately 3 to 4 weeks.  Procedure:  The patient was identified in the holding area and taken to room 8.  The patient was then placed supine on the table and prepped and draped in the usual sterile fashion.  A time out was called.  Ultrasound was used to evaluate the left common femoral artery.  There was a bypass there the artery with itself was quite large.  The area was anesthetized 1% lidocaine.  There was significant scar tissue.  The artery itself was patent.  We cannulated with micropuncture needle followed the wire and sheath.  We had difficulty passing a wire we did this under fluoroscopic guidance placed a micropuncture sheath followed by Bentson wire.  Is 5 French sheath was placed.  We had to use a Glidewire advantage to get into the aorta and then placed an Omni catheter performed aortogram followed by pelvic views with LAO projections followed by bilateral lower extremity runoff with the above findings.  I then used a crossover catheter and Glidewire advantage to cross the subtotally occluded right common extending and external iliac arteries.  The patient was fully heparinized we placed a long 7 [place] sheath.  We then  placed our 9 x 37 mm stent and placed retracted our sheath performed angiography and deployed our stent.  Completion demonstrated no residual stenosis.  We then did a pullback gradient across the left common iliac artery there was no significant gradient.  We exchanged for a short 7 [place] sheath deployed a minx device.  Unfortunately the patient developed significant scrotal swelling.  He remained hemodynamically stable.  Patient was already planned to be admitted overnight due to lack of transportation and family support and now will be admitted to watch his scrotal edema as well.  He otherwise tolerated the procedure well. Contrast: 150cc Brandon C. Randie Heinz, MD [facility] Office: [phone] Pager: [phone]   Medications: Infusions: . sodium chloride    . sodium chloride    . sodium chloride    . sodium chloride      Scheduled Medications: . amLODipine  2.5 mg  Oral QHS  . aspirin EC  81 mg Oral Daily  . Chlorhexidine Gluconate Cloth  6 each Topical Q0600  . clopidogrel  300 mg Oral Once   Followed by  . clopidogrel  75 mg Oral Q breakfast  . diltiazem (CARDIZEM) infusion  5-15 mg/hr Intravenous To OR  . docusate sodium  100 mg Oral Daily  . heparin  5,000 Units Subcutaneous Q8H  . labetalol  200 mg Oral BID  . pantoprazole  40 mg Oral Daily  . rosuvastatin  10 mg Oral Daily  . sevelamer carbonate  3,200 mg Oral TID WC  . sodium chloride flush  3 mL Intravenous Q12H  . sodium zirconium cyclosilicate  10 g Oral TID    have reviewed scheduled and prn medications.  Physical Exam: General: Lying on bed, opening eyes but lethargic Heart:RRR, s1s2 nl Lungs:clear b/l, no crackle Abdomen:soft, Non-tender, non-distended Extremities:trace edema Dialysis Access: Left upper extremity AV fistula has good thrill and bruit.  Terriyah Westra Prasad Mallie Giambra 06/16/2020,11:15 AM  LOS: 1 day  Pager: [phone]

## 2020-06-16 NOTE — Progress Notes (Signed)
   06/16/20 0945  Assess: MEWS Score  Temp 97.7 F (36.5 C)  BP 98/66  Pulse Rate 77  ECG Heart Rate 86  Resp 12  Level of Consciousness Responds to Pain  SpO2 100 %  O2 Device Room Air  Assess: MEWS Score  MEWS Temp 0  MEWS Systolic 1  MEWS Pulse 0  MEWS RR 1  MEWS LOC 2  MEWS Score 4  MEWS Score Color Red  Assess: if the MEWS score is Yellow or Red  Were vital signs taken at a resting state? Yes  Focused Assessment Change from prior assessment (see assessment flowsheet) (increasing lethargy; MD aware)  Early Detection of Sepsis Score *See Row Information* Low  MEWS guidelines implemented *See Row Information* Yes  Treat  MEWS Interventions Escalated (See documentation below)  Take Vital Signs  Increase Vital Sign Frequency  Red: Q 1hr X 4 then Q 4hr X 4, if remains red, continue Q 4hrs  Escalate  MEWS: Escalate Red: discuss with charge nurse/RN and provider, consider discussing with RRT  Notify: Charge Nurse/RN  Name of Charge Nurse/RN Notified Clint Lipps  Date Charge Nurse/RN Notified 06/16/20  Time Charge Nurse/RN Notified 0945  Notify: Provider  Provider Name/Title Dr. Donzetta Matters  Date Provider Notified 06/16/20  Time Provider Notified [phone]  Notification Type Rounds  Notification Reason Other (Comment) (morning rounds)  Response No new orders (patient is receiving 2 units of blood)  Date of Provider Response 06/16/20  Time of Provider Response 0945  Document  Patient Outcome Not stable and remains on department (to receive second unit of blood)  Progress note created (see row info) Yes

## 2020-06-16 NOTE — Progress Notes (Signed)
Patient c/o increasing pain/tenderness in his lower abdomen and scrotum.  States he feels like the swelling has increased.  Scrotal edema does appear to have increased slightly, but no hematoma is palpated at left groin.  Lower abdomen and left groin are edematous, but soft, and both are very tender to touch.  Left pedal pulses are palpable, and right pedal pulses are found using a doppler.  BP 90s/60s.  HR 90s.  Rapid response RN, Waunita Schooner, assessed patient and does not believe he is actively bleeding.  H/H this am is 7.4/23.8.  Dr. Oneida Alar notified of the above and asks to be called if BP drops.  Will continue to monitor for s/s of bleeding.  Jodell Cipro

## 2020-06-16 NOTE — Consult Note (Signed)
Cardiology Consultation:   Patient ID: Raymond [name] MRN: [id number]; DOB: 1955-11-29  Admit date: 06/15/2020 Date of Consult: 06/16/2020  Primary Care Provider: [name], FNP [facility] Cardiologist: Jenkins Rouge, MD  [place] Electrophysiologist:  None    Patient Profile:   Raymond [name] is a 64 y.o. male with a history of mild CAD, anemia, COPD, ESRD on HD, Hepatitis C, HTN, PAD, thyroid disease, paroxysmal atrial fibrillation, tobacco abuse who is being seen today for the evaluation of atrial fibrillation at the request of Dr. Donzetta Matters.  History of Present Illness:   Raymond [name] is a 64 y.o. male with a history of mild CAD, anemia, COPD, ESRD on HD, Hepatitis C, HTN, PAD, thyroid disease, paroxysmal atrial fibrillation, tobacco abuse who is being seen today for the evaluation of atrial fibrillation at the request of Dr. Donzetta Matters. He was admitted on 06/15/20 following PV procedure (stenting of right common and external iliac artery). He had a groin/scrotal hematoma following the procedure leading to surgical exploration and closure of the femoral artery. He went to dialysis this afternoon and when he returned to the telemetry unit around 5 pm was noted to be in atrial flutter with HR in the 90s to 110s. BP in the 49F systolic. He has documented history of atrial fibrillation in 2019 and had been on coumadin. Unclear when the coumadin was stopped. I cannot access telemetry from this morning but he may have been in atrial flutter prior to dialysis per nursing.   His only complaint tonight is of scrotal pain. No dyspnea or chest pain.    Past Medical History:  Diagnosis Date  . Anemia   . CAD (coronary artery disease)    a. 03/2018: cath showing 20 to 30% stenosis along the LAD, luminal irregularities along the RCA, and angiographically normal LCx  . COPD (chronic obstructive pulmonary disease) ([place])   . Dyspnea    "with too much fluid"  . ESRD (end stage renal disease) on  dialysis [facility])    "MWF; Jeneen Rinks" (07/22/17)  . Hemodialysis patient ([place])   . Hepatitis C    Still positive s/p liver biopsy at [facility]  and interferon therapy for 6 months. Most recent lab work was on 10/24/12  . Hepatitis C    "took the tx; gone now" (12/05/2016)  Was treated  . History of blood transfusion ~ 2012/2013   "related to my kidneys; blood was low"  . Hypertension   . Peripheral arterial disease ([place])   . Substance abuse ([place])   . Thyroid disease     Past Surgical History:  Procedure Laterality Date  . ABDOMINAL AORTOGRAM W/LOWER EXTREMITY N/A 02/08/2018   Procedure: ABDOMINAL AORTOGRAM W/LOWER EXTREMITY;  Surgeon: Conrad Dixon, MD;  Location: [place] CV LAB;  Service: Cardiovascular;  Laterality: N/A;  . ABDOMINAL AORTOGRAM W/LOWER EXTREMITY Bilateral 06/15/2020   Procedure: ABDOMINAL AORTOGRAM W/LOWER EXTREMITY;  Surgeon: Waynetta Sandy, MD;  Location: [place] CV LAB;  Service: Cardiovascular;  Laterality: Bilateral;  . AV FISTULA PLACEMENT Left Aug. 2013 ?  [name] BIOPSY  01/17/2020   Procedure: BIOPSY;  Surgeon: Jackquline Denmark, MD;  Location: [facility] ENDOSCOPY;  Service: Endoscopy;;  . COLONOSCOPY    . COLONOSCOPY WITH PROPOFOL N/A 08/21/2018   Procedure: COLONOSCOPY WITH PROPOFOL;  Surgeon: Yetta Flock, MD;  Location: [place];  Service: Gastroenterology;  Laterality: N/A;  . ENTEROSCOPY N/A 01/17/2020   Procedure: ENTEROSCOPY;  Surgeon: Jackquline Denmark, MD;  Location: [facility]  ENDOSCOPY;  Service: Endoscopy;  Laterality: N/A;  . ESOPHAGOGASTRODUODENOSCOPY (EGD) WITH PROPOFOL N/A 08/20/2018   Procedure: ESOPHAGOGASTRODUODENOSCOPY (EGD) WITH PROPOFOL;  Surgeon: Gatha Mayer, MD;  Location: [place];  Service: Endoscopy;  Laterality: N/A;  . FEMORAL-POPLITEAL BYPASS GRAFT Left 04/11/2018   Procedure: BYPASS GRAFT FEMORAL-POPLITEAL ARTERY LEFT LEG;  Surgeon: Rosetta Posner, MD;  Location: [place];  Service: Vascular;  Laterality: Left;  . HOT HEMOSTASIS N/A  08/20/2018   Procedure: HOT HEMOSTASIS (ARGON PLASMA COAGULATION/BICAP);  Surgeon: Gatha Mayer, MD;  Location: [facility] ENDOSCOPY;  Service: Endoscopy;  Laterality: N/A;  . HOT HEMOSTASIS N/A 01/17/2020   Procedure: HOT HEMOSTASIS (ARGON PLASMA COAGULATION/BICAP);  Surgeon: Jackquline Denmark, MD;  Location[facility] ENDOSCOPY;  Service: Endoscopy;  Laterality: N/A;  . LEFT HEART CATH AND CORONARY ANGIOGRAPHY N/A 03/29/2018   Procedure: LEFT HEART CATH AND CORONARY ANGIOGRAPHY;  Surgeon: Nelva Bush, MD;  Location: [place] CV LAB;  Service: Cardiovascular;  Laterality: N/A;  . LIVER BIOPSY    . ORIF ULNAR FRACTURE Left 05/18/2017   Procedure: OPEN REDUCTION INTERNAL FIXATION (ORIF) ULNAR FRACTURE;  Surgeon: Iran Planas, MD;  Location: [place];  Service: Orthopedics;  Laterality: Left;  . PERIPHERAL VASCULAR INTERVENTION Right 06/15/2020   Procedure: PERIPHERAL VASCULAR INTERVENTION;  Surgeon: Waynetta Sandy, MD;  Location: [place] CV LAB;  Service: Cardiovascular;  Laterality: Right;  . POLYPECTOMY  08/21/2018   Procedure: POLYPECTOMY;  Surgeon: Yetta Flock, MD;  Location: [facility] ENDOSCOPY;  Service: Gastroenterology;;  . SHUNTOGRAM N/A 12/11/2012   Procedure: Earney Mallet;  Surgeon: Serafina Mitchell, MD;  Location: [facility] CATH LAB;  Service: Cardiovascular;  Laterality: N/A;  . WOUND EXPLORATION Left 06/15/2020   Procedure: GROIN  EXPLORATION;  Surgeon: Waynetta Sandy, MD;  Location: [place];  Service: Vascular;  Laterality: Left;     Home Medications:  Prior to Admission medications   Medication Sig Start Date End Date Taking? Authorizing Provider  albuterol (VENTOLIN HFA) 108 (90 Base) MCG/ACT inhaler Inhale 2 puffs into the lungs every 4 (four) hours as needed for shortness of breath.  11/06/19  Yes [provider]  bismuth subsalicylate (PEPTO BISMOL) 262 MG chewable tablet Chew 524 mg by mouth as needed for indigestion or diarrhea or loose stools.   Yes [provider]  pantoprazole (PROTONIX) 40 MG tablet Take 1 tablet (40 mg total) by mouth daily. 01/18/20  Yes Ghimire, Henreitta Leber, MD  QUEtiapine (SEROQUEL) 100 MG tablet Take 100 mg by mouth at bedtime as needed (sleep).  01/01/20  Yes [provider]  sevelamer carbonate (RENVELA) 800 MG tablet Take 4 tablets (3,200 mg total) by mouth 3 (three) times daily with meals. Patient taking differently: Take 4,000 mg by mouth See admin instructions. Take 4000 mg with each meal and snack 09/14/18  Yes Vasireddy, Grier Mitts, MD  amLODipine (NORVASC) 2.5 MG tablet Take 2.5 mg by mouth at bedtime.  01/01/20   [provider]  labetalol (NORMODYNE) 200 MG tablet Take 1 tablet (200 mg total) by mouth 2 (two) times daily. Patient not taking: Reported on 06/12/2020 01/18/20   Jonetta Osgood, MD    Inpatient Medications: Scheduled Meds: . aspirin EC  81 mg Oral Daily  . calcitRIOL  1.5 mcg Oral Q T,Th,Sa-HD  . Chlorhexidine Gluconate Cloth  6 each Topical Q0600  . clopidogrel  300 mg Oral Once   Followed by  . clopidogrel  75 mg Oral Q breakfast  . darbepoetin (ARANESP) injection - DIALYSIS  60 mcg Intravenous Q Tue-HD  . docusate sodium  100 mg Oral Daily  . heparin  5,000 Units Subcutaneous Q8H  . labetalol  200 mg Oral BID  . pantoprazole  40 mg Oral Daily  . rosuvastatin  10 mg Oral Daily  . sevelamer carbonate  3,200 mg Oral TID WC  . sodium chloride flush  3 mL Intravenous Q12H   Continuous Infusions: . sodium chloride    . sodium chloride     PRN Meds: sodium chloride, acetaminophen, albuterol, bisacodyl, bismuth subsalicylate, hydrALAZINE, HYDROmorphone (DILAUDID) injection, labetalol, metoprolol tartrate, ondansetron (ZOFRAN) IV, oxyCODONE, QUEtiapine, senna-docusate, sodium chloride flush  Allergies:   No Known Allergies  Social History:   Social History   Socioeconomic History  . Marital status: Single    Spouse name: Not on file  . Number of children: Not on file   . Years of education: Not on file  . Highest education level: Not on file  Occupational History  . Not on file  Tobacco Use  . Smoking status: Current Every Day Smoker    Packs/day: 0.50    Years: 25.00    Pack years: 12.50    Types: Cigarettes    Last attempt to quit: 08/01/2011    Years since quitting: 8.8  . Smokeless tobacco: Never Used  . Tobacco comment: he smokes 6 cigarettes a week  Vaping Use  . Vaping Use: Never used  Substance and Sexual Activity  . Alcohol use: Yes    Comment: occasionally/ one half pint  . Drug use: Not Currently    Types: Cocaine  . Sexual activity: Not on file  Other Topics Concern  . Not on file  Social History Narrative   Pt. Is homeless.  He lives in shelters and motels when he can afford.     Social Determinants of Health   Financial Resource Strain:   . Difficulty of Paying Living Expenses: Not on file  Food Insecurity:   . Worried About Charity fundraiser in the Last Year: Not on file  . Ran Out of Food in the Last Year: Not on file  Transportation Needs:   . Lack of Transportation (Medical): Not on file  . Lack of Transportation (Non-Medical): Not on file  Physical Activity:   . Days of Exercise per Week: Not on file  . Minutes of Exercise per Session: Not on file  Stress:   . Feeling of Stress : Not on file  Social Connections:   . Frequency of Communication with Friends and Family: Not on file  . Frequency of Social Gatherings with Friends and Family: Not on file  . Attends Religious Services: Not on file  . Active Member of Clubs or Organizations: Not on file  . Attends Archivist Meetings: Not on file  . Marital Status: Not on file  Intimate Partner Violence:   . Fear of Current or Ex-Partner: Not on file  . Emotionally Abused: Not on file  . Physically Abused: Not on file  . Sexually Abused: Not on file    Family History:   Family History  Problem Relation Age of Onset  . Diabetes Father   .  Hypertension Father   . Heart disease Father      ROS:  Please see the history of present illness.  All other ROS reviewed and negative.     Physical Exam/Data:   Vitals:   06/16/20 1503 06/16/20 1552 06/16/20 1620 06/16/20 1720  BP: (!) 102/50 [name])  86/64 (!) 101/56 (!) 82/54  Pulse: 79 86 96   Resp: 12 15    Temp: 97.6 F (36.4 C) 98 F (36.7 C)    TempSrc: Oral Oral    SpO2: 96% 100% 100% 97%  Weight: 74 kg     Height:        Intake/Output Summary (Last 24 hours) at 06/16/2020 1820 Last data filed at 06/16/2020 1700 Gross per 24 hour  Intake 1110 ml  Output 2179 ml  Net -1069 ml   Last 3 Weights 06/16/2020 06/16/2020 06/16/2020  Weight (lbs) 163 lb 2.3 oz 167 lb 8.8 oz 165 lb 9.1 oz  Weight (kg) 74 kg 76 kg 75.1 kg     Body mass index is 24.81 kg/m.  General:  Well nourished, well developed, in no acute distress HEENT: normal Lymph: no adenopathy Neck: no JVD Endocrine:  No thryomegaly Vascular: No carotid bruits; FA pulses 2+ bilaterally without bruits  Cardiac:  Regular with ectopy. no murmur  Lungs:  clear to auscultation bilaterally, no wheezing, rhonchi or rales  Abd: soft, nontender, no hepatomegaly  Ext: no edema Musculoskeletal:  No deformities, BUE and BLE strength normal and equal Skin: warm and dry  Neuro:  CNs 2-12 intact, no focal abnormalities noted Psych:  Normal affect   EKG:  The EKG was personally reviewed and demonstrates:  Atrial flutter, rate 100 bpm Telemetry:  Telemetry was personally reviewed and demonstrates:  Atrial flutter, rate 80-110 bpm  Relevant CV Studies:   Laboratory Data:  High Sensitivity Troponin:  No results for input(s): TROPONINIHS in the last 720 hours.   Chemistry Recent Labs  Lab 06/15/20 1045 06/15/20 1045 06/15/20 1538 06/15/20 1554 06/15/20 1855 06/16/20 0338  NA 135   < > 140 135  --  135  K 5.9*   < > 3.5 5.9*  --  6.8*  CL 95*  --  108  --   --  95*  CO2  --   --   --   --   --  26  GLUCOSE 110*  --   76  --   --  177*  BUN 91*  --  46*  --   --  75*  CREATININE 15.40*   < > 9.60*  --  17.20* 16.86*  CALCIUM  --   --   --   --   --  7.9*  GFRNONAA  --   --   --   --  3* 3*  ANIONGAP  --   --   --   --   --  14   < > = values in this interval not displayed.    No results for input(s): PROT, ALBUMIN, AST, ALT, ALKPHOS, BILITOT in the last 168 hours. Hematology Recent Labs  Lab 06/15/20 1855 06/16/20 0338 06/16/20 1040  WBC 4.5 2.7* 3.1*  RBC 2.66* 2.19* 2.45*  HGB 9.0* 7.4* 8.3*  HCT 29.6* 23.8* 25.1*  MCV 111.3* 108.7* 102.4*  MCH 33.8 33.8 33.9  MCHC 30.4 31.1 33.1  RDW 16.6* 16.5* 19.4*  PLT 101* 90* 42*   BNPNo results for input(s): BNP, PROBNP in the last 168 hours.  DDimer No results for input(s): DDIMER in the last 168 hours.   Radiology/Studies:  PERIPHERAL VASCULAR CATHETERIZATION  Result Date: 06/15/2020 Patient name: KINNEY SACKMANN MRN: [id number] DOB: May 19, 1956 Sex: male 06/15/2020 Pre-operative Diagnosis: Right lower extremity claudication Post-operative diagnosis:  Same Surgeon:  Erlene Quan C. Donzetta Matters, MD Procedure Performed: 1.  Ultrasound-guided  cannulation left common femoral artery 2.  Aortogram with bilateral lower extremity runoff 3.  Stent of right common and external iliac arteries with 9 x 37 mm biliary express balloon expandable stent 4.  Minx device closure left common femoral artery 5.  Moderate sedation with fentanyl and Versed for 40 minutes Indications: 64 year old male with esrd and previous left common femoral to below-knee popliteal artery bypass.  He now has significant life limiting claudication on the right he is indicated for aortogram possible intervention. Findings: His aorta bilateral common iliac arteries were heavily calcified.  On the right side his common down to his external was subtotally occluded with approximately 95% stenosis.  After stenting this is resolved to less than 20% stenosis there is brisk flow through the stent although some of the  viewing is obstructed with calcium.  There was some flow that remained in the hypogastric artery on the right after stenting across it with a noncovered stent.  On the left side there is significant calcium and although wire was difficult to pass there was no gradient across the lesion.  The left common femoral bypass is patent I cannot see the distal anastomosis he appears to have runoff via the anterior tibial and peroneal arteries.  On the right side which is the site of interest he is a flush occlusion of the SFA profunda fills the thigh reconstitutes below-knee popliteal artery where he is 3 diminutive vessels running off to the ankle. Patient will be revisited in the office to evaluate for improvement in symptoms.  If he continues to have severe life limiting claudication we will plan for right common femoral to below-knee popliteal artery bypass.  I will have him follow-up with saphenous vein mapping in approximately 3 to 4 weeks.  Procedure:  The patient was identified in the holding area and taken to room 8.  The patient was then placed supine on the table and prepped and draped in the usual sterile fashion.  A time out was called.  Ultrasound was used to evaluate the left common femoral artery.  There was a bypass there the artery with itself was quite large.  The area was anesthetized 1% lidocaine.  There was significant scar tissue.  The artery itself was patent.  We cannulated with micropuncture needle followed the wire and sheath.  We had difficulty passing a wire we did this under fluoroscopic guidance placed a micropuncture sheath followed by Bentson wire.  Is 5 French sheath was placed.  We had to use a Glidewire advantage to get into the aorta and then placed an Omni catheter performed aortogram followed by pelvic views with LAO projections followed by bilateral lower extremity runoff with the above findings.  I then used a crossover catheter and Glidewire advantage to cross the subtotally occluded  right common extending and external iliac arteries.  The patient was fully heparinized we placed a long 7 [place] sheath.  We then placed our 9 x 37 mm stent and placed retracted our sheath performed angiography and deployed our stent.  Completion demonstrated no residual stenosis.  We then did a pullback gradient across the left common iliac artery there was no significant gradient.  We exchanged for a short 7 [place] sheath deployed a minx device.  Unfortunately the patient developed significant scrotal swelling.  He remained hemodynamically stable.  Patient was already planned to be admitted overnight due to lack of transportation and family support and now will be admitted to watch his scrotal edema as well.  He otherwise tolerated the procedure well. Contrast: 150cc Brandon C. Donzetta Matters, MD [facility] [place] Office: [phone] Pager: [phone]    Assessment and Plan:   1. Atrial flutter with controlled ventricular response: He has a history of atrial fibrillation. Unclear how long he has been in atrial flutter today. There is no admission EKG. Unable to access telemetry from this morning. Rates are controlled right now on his home dose of beta blocker. (80-110 bpm).  -If he has uncontrolled rates tonight, IV amiodarone can be started.  -No IV heparin given his recent groin/scrotal hematoma and anemia.   Please call the on call cardiology team tonight with questions. We will follow with you.   For questions or updates, please contact [place] Please consult [email or web address] for contact info under    Signed, Lauree Chandler, MD  06/16/2020 6:20 PM

## 2020-06-16 NOTE — Progress Notes (Signed)
   06/16/20 0605  Assess: MEWS Score  Temp 97.7 F (36.5 C)  BP 94/67  Pulse Rate 66  ECG Heart Rate 99  Resp 10  Level of Consciousness Alert  SpO2 100 %  O2 Device Room Air  Assess: MEWS Score  MEWS Temp 0  MEWS Systolic 1  MEWS Pulse 0  MEWS RR 1  MEWS LOC 0  MEWS Score 2  MEWS Score Color Yellow  Assess: if the MEWS score is Yellow or Red  Were vital signs taken at a resting state? Yes  Focused Assessment Change from prior assessment (see assessment flowsheet)  Early Detection of Sepsis Score *See Row Information* Medium  MEWS guidelines implemented *See Row Information* Yes  Treat  MEWS Interventions Administered scheduled meds/treatments;Escalated (See documentation below)  Pain Scale 0-10  Pain Score 0  Take Vital Signs  Increase Vital Sign Frequency  Yellow: Q 2hr X 2 then Q 4hr X 2, if remains yellow, continue Q 4hrs  Escalate  MEWS: Escalate Yellow: discuss with charge nurse/RN and consider discussing with provider and RRT  Notify: Charge Nurse/RN  Name of Charge Nurse/RN Notified Chanda Busing, RN  Date Charge Nurse/RN Notified 06/16/20  Time Charge Nurse/RN Notified 0600  Notify: Provider  Provider Name/Title Dr. Ruta Hinds  Date Provider Notified 06/16/20  Time Provider Notified 0600  Notification Type Page  Notification Reason Change in status (Low BP; symptomatic anemia)  Response See new orders  Date of Provider Response 06/16/20  Time of Provider Response 0602  Notify: Rapid Response  Name of Rapid Response RN Notified Waunita Schooner, RN  Date Rapid Response Notified 06/16/20  Time Rapid Response Notified 0600  Document  Progress note created (see row info) Yes

## 2020-06-16 NOTE — Progress Notes (Signed)
Hemodialysis- Tolerated well. Received 1 unit prbcs with HD. More alert. UF goal reduced due to persistent hypotension. UF 2L today. Reported off to primary RN.

## 2020-06-17 DIAGNOSIS — I483 Typical atrial flutter: Secondary | ICD-10-CM | POA: Diagnosis not present

## 2020-06-17 LAB — TYPE AND SCREEN
ABO/RH(D): B POS
Antibody Screen: NEGATIVE
Unit division: 0
Unit division: 0
Unit division: 0
Unit division: 0

## 2020-06-17 LAB — BPAM RBC
Blood Product Expiration Date: 202201042359
Blood Product Expiration Date: 202201042359
Blood Product Expiration Date: 202201092359
Blood Product Expiration Date: 202201102359
ISSUE DATE / TIME: 202112061543
ISSUE DATE / TIME: 202112070611
ISSUE DATE / TIME: 202112071204
ISSUE DATE / TIME: 202112080751
Unit Type and Rh: 5100
Unit Type and Rh: 5100
Unit Type and Rh: 7300
Unit Type and Rh: 7300

## 2020-06-17 LAB — RENAL FUNCTION PANEL
Albumin: 2.8 g/dL — ABNORMAL LOW (ref 3.5–5.0)
Albumin: 2.9 g/dL — ABNORMAL LOW (ref 3.5–5.0)
Anion gap: 15 (ref 5–15)
Anion gap: 12 (ref 5–15)
BUN: 24 mg/dL — ABNORMAL HIGH (ref 8–23)
BUN: 31 mg/dL — ABNORMAL HIGH (ref 8–23)
CO2: 29 mmol/L (ref 22–32)
CO2: 32 mmol/L (ref 22–32)
Calcium: 7.9 mg/dL — ABNORMAL LOW (ref 8.9–10.3)
Calcium: 8.6 mg/dL — ABNORMAL LOW (ref 8.9–10.3)
Chloride: 93 mmol/L — ABNORMAL LOW (ref 98–111)
Chloride: 92 mmol/L — ABNORMAL LOW (ref 98–111)
Creatinine, Ser: 9.22 mg/dL — ABNORMAL HIGH (ref 0.61–1.24)
Creatinine, Ser: 9.86 mg/dL — ABNORMAL HIGH (ref 0.61–1.24)
GFR, Estimated: 6 mL/min — ABNORMAL LOW (ref >60)
GFR, Estimated: 5 mL/min — ABNORMAL LOW (ref >60)
Glucose, Bld: 105 mg/dL — ABNORMAL HIGH (ref 70–99)
Glucose, Bld: 96 mg/dL (ref 70–99)
Phosphorus: 4.8 mg/dL — ABNORMAL HIGH (ref 2.5–4.6)
Phosphorus: 5.3 mg/dL — ABNORMAL HIGH (ref 2.5–4.6)
Potassium: 3.9 mmol/L (ref 3.5–5.1)
Potassium: 4.4 mmol/L (ref 3.5–5.1)
Sodium: 137 mmol/L (ref 135–145)
Sodium: 136 mmol/L (ref 135–145)

## 2020-06-17 LAB — CBC
HCT: 30.7 % — ABNORMAL LOW (ref 39.0–52.0)
Hemoglobin: 9.5 g/dL — ABNORMAL LOW (ref 13.0–17.0)
MCH: 32.1 pg (ref 26.0–34.0)
MCHC: 30.9 g/dL (ref 30.0–36.0)
MCV: 103.7 fL — ABNORMAL HIGH (ref 80.0–100.0)
Platelets: 87 10*3/uL — ABNORMAL LOW (ref 150–400)
RBC: 2.96 MIL/uL — ABNORMAL LOW (ref 4.22–5.81)
RDW: 21.2 % — ABNORMAL HIGH (ref 11.5–15.5)
WBC: 3.3 10*3/uL — ABNORMAL LOW (ref 4.0–10.5)
nRBC: 0 % (ref 0.0–0.2)

## 2020-06-17 MED ORDER — LIDOCAINE HCL (PF) 1 % IJ SOLN: 5.0000 mL | INTRAMUSCULAR | Status: DC | PRN

## 2020-06-17 MED ORDER — SODIUM CHLORIDE 0.9 % IV SOLN: 100.0000 mL | INTRAVENOUS | Status: DC | PRN

## 2020-06-17 MED ORDER — AMIODARONE HCL IN DEXTROSE 360-4.14 MG/200ML-% IV SOLN
30.0000 mg/h | INTRAVENOUS | Status: DC
  Administered 2020-06-17 – 2020-06-18 (×5): 30 mg/h via INTRAVENOUS
  Filled 2020-06-17 (×3): qty 200

## 2020-06-17 MED ORDER — AMIODARONE LOAD VIA INFUSION
150.0000 mg | Freq: Once | INTRAVENOUS | Status: AC
  Administered 2020-06-17: 150 mg via INTRAVENOUS
  Filled 2020-06-17: qty 83.34

## 2020-06-17 MED ORDER — ALTEPLASE 2 MG IJ SOLR: 2.0000 mg | Freq: Once | INTRAMUSCULAR | Status: DC | PRN

## 2020-06-17 MED ORDER — CLOPIDOGREL BISULFATE 75 MG PO TABS
75.0000 mg | ORAL_TABLET | Freq: Every day | ORAL | Status: DC
  Administered 2020-06-18 – 2020-06-22 (×5): 75 mg via ORAL
  Filled 2020-06-17 (×5): qty 1

## 2020-06-17 MED ORDER — AMIODARONE HCL IN DEXTROSE 360-4.14 MG/200ML-% IV SOLN
60.0000 mg/h | INTRAVENOUS | Status: DC
  Administered 2020-06-17 (×2): 60 mg/h via INTRAVENOUS
  Filled 2020-06-17 (×2): qty 200

## 2020-06-17 MED ORDER — PENTAFLUOROPROP-TETRAFLUOROETH EX AERO: 1.0000 "application " | INHALATION_SPRAY | CUTANEOUS | Status: DC | PRN

## 2020-06-17 MED ORDER — CHLORHEXIDINE GLUCONATE CLOTH 2 % EX PADS: 6.0000 | MEDICATED_PAD | Freq: Every day | CUTANEOUS | Status: DC

## 2020-06-17 MED ORDER — LIDOCAINE-PRILOCAINE 2.5-2.5 % EX CREA
1.0000 "application " | TOPICAL_CREAM | CUTANEOUS | Status: DC | PRN
  Filled 2020-06-17: qty 5

## 2020-06-17 MED ORDER — HEPARIN SODIUM (PORCINE) 1000 UNIT/ML DIALYSIS
1000.0000 [IU] | INTRAMUSCULAR | Status: DC | PRN
  Filled 2020-06-17: qty 1

## 2020-06-17 NOTE — Progress Notes (Addendum)
Progress Note    06/17/2020 7:47 AM 2 Days Post-Op  Subjective:  Pain in left groin and scrotum   Vitals:   06/16/20 2005 06/17/20 0434  BP: (!) 84/64 92/64  Pulse: 92 93  Resp: 16 16  Temp: 98.1 F (36.7 C) 97.6 F (36.4 C)  SpO2: 98% 96%   Physical Exam: Cardiac:  tachycardic Lungs: non labored Incisions: left groin dressing is clean, dry and intact Extremities: 2+ left femoral pulses. Bilateral lower extremities well perfused and warm. Doppler Dp left foot Abdomen:  Soft, non tender. Scrotal swelling present as well as penile. Not as tense as yesterday Neurologic: Alert and oriented  CBC    Component Value Date/Time   WBC 3.1 (L) 06/16/2020 1040   RBC 2.45 (L) 06/16/2020 1040   HGB 9.5 (L) 06/16/2020 1834   HGB 11.0 (L) 03/27/2018 1420   HCT 28.5 (L) 06/16/2020 1834   HCT 32.7 (L) 03/27/2018 1420   PLT 42 (L) 06/16/2020 1040   PLT 174 03/27/2018 1420   MCV 102.4 (H) 06/16/2020 1040   MCV 101 (H) 03/27/2018 1420   MCH 33.9 06/16/2020 1040   MCHC 33.1 06/16/2020 1040   RDW 19.4 (H) 06/16/2020 1040   RDW 14.6 03/27/2018 1420   LYMPHSABS 2.0 01/14/2020 0600   LYMPHSABS 0.9 03/27/2018 1420   MONOABS 0.9 01/14/2020 0600   EOSABS 0.1 01/14/2020 0600   EOSABS 0.1 03/27/2018 1420   BASOSABS 0.0 01/14/2020 0600   BASOSABS 0.0 03/27/2018 1420    BMET    Component Value Date/Time   NA 137 06/17/2020 0359   NA 140 05/15/2018 1632   K 3.9 06/17/2020 0359   CL 93 (L) 06/17/2020 0359   CO2 29 06/17/2020 0359   GLUCOSE 105 (H) 06/17/2020 0359   BUN 24 (H) 06/17/2020 0359   BUN 38 (H) 05/15/2018 1632   CREATININE 9.22 (H) 06/17/2020 0359   CALCIUM 7.9 (L) 06/17/2020 0359   GFRNONAA 6 (L) 06/17/2020 0359   GFRAA 6 (L) 01/18/2020 1232    INR    Component Value Date/Time   INR 1.23 08/19/2018 0528     Intake/Output Summary (Last 24 hours) at 06/17/2020 0747 Last data filed at 06/17/2020 0433 Gross per 24 hour  Intake 1095 ml  Output 2179 ml  Net -1084  ml     Assessment/Plan:  64 y.o. male is s/p right common and external leg artery stenting 12/6. Subsequently developed extensive scrotal hematoma on the left with presumed failure of the minx device. He was subsequently taken to the operating room for exploration and primary repair of the artery 2 Days Post-Op. Left lower extremity is well perfused with palpable femoral pulse and Doppler DP left foot. He is ESRD on HD. Had atrial fibrillation and hypotension during dialysis yesterday. Appreciate cardiology assistance. Has been started on IV amiodarone. Blood pressure remains soft. Holding home BP meds. Otherwise hemodynamically stable. Hgb 9.5. He is more alert and oriented this morning. He is still complaining of groin and scrotal discomfort. Swelling is improving  DVT prophylaxis:  Sq heparin   Karoline Caldwell, PA-C [facility] [phone] 06/17/2020 7:47 AM   I have independently interviewed and examined patient and agree with PA assessment and plan above.  Appreciate nephrology and cardiology evaluations of patient.  Blood pressure better on my exam this afternoon swelling of penis and scrotum stable if not somewhat improved.  Does not appear to be actively bleeding.  Raychel Dowler C. Donzetta Matters, MD [facility] [place] Office: [phone] Pager: [phone]

## 2020-06-17 NOTE — Plan of Care (Signed)

## 2020-06-17 NOTE — Progress Notes (Signed)
[name] NEPHROLOGY PROGRESS NOTE  Assessment/ Plan: Pt is a 64 y.o. yo male with ESRD on HD, anemia who underwent vascular procedure on 12/6, consulted for ESRD management. OP HD orders: [facility], outpatient orders: 4hrs, F180, 450/800, 2K, 2Ca, 137Na, 35Bicarb UF Profile 4, no heparin  #PAD underwent right common and external leg artery stenting on 12/6 by vascular.  Scrotal hematoma, vascular is following.  # ESRD: TTS: He has chronic hypotension, had dialysis yesterday with 2.1 L UF, plan for next HD tomorrow.  # Anemia due to ESRD and ABLA during surgery: Drop in hemoglobin.  Continue ESA.  Transfuse blood as needed.  # Secondary hyperparathyroidism: Continue calcitriol, holding Sensipar because of hypercalcemia.  Monitor CEA, phosphorus level.  # HTN/volume: Off of antihypertensive because of hypotension, may need midodrine during HD.  Asymptomatic.  #AMS likely due to sedatives: Mental status improved.  Recommend to avoid or at least minimize sedatives.  #Hyperkalemia: Managed with dialysis.  #Atrial flutter with RVR: Unable to receive beta-blocker because of hypotension.  Starting amiodarone by cardiologist.  Subjective: Seen and examined at bedside.  He looks more alert and awake today.  Denies any symptoms.  Objective Vital signs in last 24 hours: Vitals:   06/16/20 1720 06/16/20 1820 06/16/20 2005 06/17/20 0434  BP: (!) 82/54 (!) 86/54 (!) 84/64 92/64  Pulse:  80 92 93  Resp:   16 16  Temp:   98.1 F (36.7 C) 97.6 F (36.4 C)  TempSrc:    Oral  SpO2: 97% 98% 98% 96%  Weight:      Height:       Weight change: 3.424 kg  Intake/Output Summary (Last 24 hours) at 06/17/2020 1031 Last data filed at 06/17/2020 0433 Gross per 24 hour  Intake 780 ml  Output 2179 ml  Net -1399 ml       Labs: Basic Metabolic Panel: Recent Labs  Lab 06/15/20 1538 06/15/20 1538 06/15/20 1554 06/15/20 1855 06/16/20 0338 06/17/20 0359  NA 140   < > 135  --  135  137  K 3.5   < > 5.9*  --  6.8* 3.9  CL 108  --   --   --  95* 93*  CO2  --   --   --   --  26 29  GLUCOSE 76  --   --   --  177* 105*  BUN 46*  --   --   --  75* 24*  CREATININE 9.60*   < >  --  17.20* 16.86* 9.22*  CALCIUM  --   --   --   --  7.9* 7.9*  PHOS  --   --   --   --   --  4.8*   < > = values in this interval not displayed.   Liver Function Tests: Recent Labs  Lab 06/17/20 0359  ALBUMIN 2.8*   No results for input(s): LIPASE, AMYLASE in the last 168 hours. No results for input(s): AMMONIA in the last 168 hours. CBC: Recent Labs  Lab 06/15/20 1855 06/15/20 1855 06/16/20 0338 06/16/20 1040 06/16/20 1834  WBC 4.5  --  2.7* 3.1*  --   HGB 9.0*   < > 7.4* 8.3* 9.5*  HCT 29.6*   < > 23.8* 25.1* 28.5*  MCV 111.3*  --  108.7* 102.4*  --   PLT 101*  --  90* 42*  --    < > = values in this interval not displayed.  Cardiac Enzymes: No results for input(s): CKTOTAL, CKMB, CKMBINDEX, TROPONINI in the last 168 hours. CBG: Recent Labs  Lab 06/15/20 2034 06/15/20 2340 06/16/20 0419  GLUCAP 169* 182* 187*    Iron Studies: No results for input(s): IRON, TIBC, TRANSFERRIN, FERRITIN in the last 72 hours. Studies/Results: PERIPHERAL VASCULAR CATHETERIZATION  Result Date: 06/15/2020 Patient name: Raymond [name] MRN: [id number] DOB: 07-29-1955 Sex: male 06/15/2020 Pre-operative Diagnosis: Right lower extremity claudication Post-operative diagnosis:  Same Surgeon:  Apolinar Junes C. Randie Heinz, MD Procedure Performed: 1.  Ultrasound-guided cannulation left common femoral artery 2.  Aortogram with bilateral lower extremity runoff 3.  Stent of right common and external iliac arteries with 9 x 37 mm biliary express balloon expandable stent 4.  Minx device closure left common femoral artery 5.  Moderate sedation with fentanyl and Versed for 40 minutes Indications: 64 year old male with esrd and previous left common femoral to below-knee popliteal artery bypass.  He now has significant life limiting  claudication on the right he is indicated for aortogram possible intervention. Findings: His aorta bilateral common iliac arteries were heavily calcified.  On the right side his common down to his external was subtotally occluded with approximately 95% stenosis.  After stenting this is resolved to less than 20% stenosis there is brisk flow through the stent although some of the viewing is obstructed with calcium.  There was some flow that remained in the hypogastric artery on the right after stenting across it with a noncovered stent.  On the left side there is significant calcium and although wire was difficult to pass there was no gradient across the lesion.  The left common femoral bypass is patent I cannot see the distal anastomosis he appears to have runoff via the anterior tibial and peroneal arteries.  On the right side which is the site of interest he is a flush occlusion of the SFA profunda fills the thigh reconstitutes below-knee popliteal artery where he is 3 diminutive vessels running off to the ankle. Patient will be revisited in the office to evaluate for improvement in symptoms.  If he continues to have severe life limiting claudication we will plan for right common femoral to below-knee popliteal artery bypass.  I will have him follow-up with saphenous vein mapping in approximately 3 to 4 weeks.  Procedure:  The patient was identified in the holding area and taken to room 8.  The patient was then placed supine on the table and prepped and draped in the usual sterile fashion.  A time out was called.  Ultrasound was used to evaluate the left common femoral artery.  There was a bypass there the artery with itself was quite large.  The area was anesthetized 1% lidocaine.  There was significant scar tissue.  The artery itself was patent.  We cannulated with micropuncture needle followed the wire and sheath.  We had difficulty passing a wire we did this under fluoroscopic guidance placed a micropuncture  sheath followed by Bentson wire.  Is 5 French sheath was placed.  We had to use a Glidewire advantage to get into the aorta and then placed an Omni catheter performed aortogram followed by pelvic views with LAO projections followed by bilateral lower extremity runoff with the above findings.  I then used a crossover catheter and Glidewire advantage to cross the subtotally occluded right common extending and external iliac arteries.  The patient was fully heparinized we placed a long 7 [place] sheath.  We then placed our 9 x 37 mm stent and  placed retracted our sheath performed angiography and deployed our stent.  Completion demonstrated no residual stenosis.  We then did a pullback gradient across the left common iliac artery there was no significant gradient.  We exchanged for a short 7 [place] sheath deployed a minx device.  Unfortunately the patient developed significant scrotal swelling.  He remained hemodynamically stable.  Patient was already planned to be admitted overnight due to lack of transportation and family support and now will be admitted to watch his scrotal edema as well.  He otherwise tolerated the procedure well. Contrast: 150cc Brandon C. Randie Heinz, MD [facility] [place] Office: [phone] Pager: [phone]   Medications: Infusions: . sodium chloride    . sodium chloride    . amiodarone     Followed by  . amiodarone      Scheduled Medications: . amiodarone  150 mg Intravenous Once  . aspirin EC  81 mg Oral Daily  . calcitRIOL  1.5 mcg Oral Q T,Th,Sa-HD  . Chlorhexidine Gluconate Cloth  6 each Topical Q0600  . [START ON 06/18/2020] clopidogrel  75 mg Oral Daily  . darbepoetin (ARANESP) injection - DIALYSIS  60 mcg Intravenous Q Tue-HD  . docusate sodium  100 mg Oral Daily  . heparin  5,000 Units Subcutaneous Q8H  . labetalol  200 mg Oral BID  . pantoprazole  40 mg Oral Daily  . rosuvastatin  10 mg Oral Daily  . sevelamer carbonate  3,200 mg Oral TID WC   . sodium chloride flush  3 mL Intravenous Q12H    have reviewed scheduled and prn medications.  Physical Exam: General: More alert awake and comfortable Heart:RRR, s1s2 nl Lungs:clear b/l, no crackle Abdomen:soft, Non-tender, non-distended Extremities:trace edema Dialysis Access: Left upper extremity AV fistula has good thrill and bruit.  Gifford Ballon Prasad Sihaam Chrobak 06/17/2020,10:31 AM  LOS: 2 days  Pager: [phone]

## 2020-06-17 NOTE — Progress Notes (Addendum)
Progress Note  Patient Name: Raymond [name] Date of Encounter: 06/17/2020  [facility] Cardiologist: Jenkins Rouge, MD   Subjective   Still having a lot of pain this morning. HR remains elevated  Inpatient Medications    Scheduled Meds: . aspirin EC  81 mg Oral Daily  . calcitRIOL  1.5 mcg Oral Q T,Th,Sa-HD  . Chlorhexidine Gluconate Cloth  6 each Topical Q0600  . clopidogrel  300 mg Oral Once   Followed by  . clopidogrel  75 mg Oral Q breakfast  . darbepoetin (ARANESP) injection - DIALYSIS  60 mcg Intravenous Q Tue-HD  . docusate sodium  100 mg Oral Daily  . heparin  5,000 Units Subcutaneous Q8H  . labetalol  200 mg Oral BID  . pantoprazole  40 mg Oral Daily  . rosuvastatin  10 mg Oral Daily  . sevelamer carbonate  3,200 mg Oral TID WC  . sodium chloride flush  3 mL Intravenous Q12H   Continuous Infusions: . sodium chloride    . sodium chloride     PRN Meds: sodium chloride, acetaminophen, albuterol, bisacodyl, bismuth subsalicylate, hydrALAZINE, HYDROmorphone (DILAUDID) injection, labetalol, metoprolol tartrate, ondansetron (ZOFRAN) IV, oxyCODONE, QUEtiapine, senna-docusate, sodium chloride flush   Vital Signs    Vitals:   06/16/20 1720 06/16/20 1820 06/16/20 2005 06/17/20 0434  BP: (!) 82/54 (!) 86/54 (!) 84/64 92/64  Pulse:  80 92 93  Resp:   16 16  Temp:   98.1 F (36.7 C) 97.6 F (36.4 C)  TempSrc:    Oral  SpO2: 97% 98% 98% 96%  Weight:      Height:        Intake/Output Summary (Last 24 hours) at 06/17/2020 0949 Last data filed at 06/17/2020 0433 Gross per 24 hour  Intake 780 ml  Output 2179 ml  Net -1399 ml   Last 3 Weights 06/16/2020 06/16/2020 06/16/2020  Weight (lbs) 163 lb 2.3 oz 167 lb 8.8 oz 165 lb 9.1 oz  Weight (kg) 74 kg 76 kg 75.1 kg      Telemetry    Atrial flutter rates 120s - Personally Reviewed  ECG    No new tracing  Physical Exam  Pleasant older male, sitting up in the bed. GEN: No acute distress.   Neck: No  JVD Cardiac: Tachy, no murmurs, rubs, or gallops.  Respiratory: Clear to auscultation bilaterally. GI: Soft, nontender, non-distended  MS: No edema; No deformity. Neuro:  Nonfocal  Psych: Normal affect   Labs    High Sensitivity Troponin:  No results for input(s): TROPONINIHS in the last 720 hours.    Chemistry Recent Labs  Lab 06/15/20 1538 06/15/20 1538 06/15/20 1554 06/15/20 1855 06/16/20 0338 06/17/20 0359  NA 140   < > 135  --  135 137  K 3.5   < > 5.9*  --  6.8* 3.9  CL 108  --   --   --  95* 93*  CO2  --   --   --   --  26 29  GLUCOSE 76  --   --   --  177* 105*  BUN 46*  --   --   --  75* 24*  CREATININE 9.60*   < >  --  17.20* 16.86* 9.22*  CALCIUM  --   --   --   --  7.9* 7.9*  ALBUMIN  --   --   --   --   --  2.8*  GFRNONAA  --   --   --  3* 3* 6*  ANIONGAP  --   --   --   --  14 15   < > = values in this interval not displayed.     Hematology Recent Labs  Lab 06/15/20 1855 06/15/20 1855 06/16/20 0338 06/16/20 1040 06/16/20 1834  WBC 4.5  --  2.7* 3.1*  --   RBC 2.66*  --  2.19* 2.45*  --   HGB 9.0*   < > 7.4* 8.3* 9.5*  HCT 29.6*   < > 23.8* 25.1* 28.5*  MCV 111.3*  --  108.7* 102.4*  --   MCH 33.8  --  33.8 33.9  --   MCHC 30.4  --  31.1 33.1  --   RDW 16.6*  --  16.5* 19.4*  --   PLT 101*  --  90* 42*  --    < > = values in this interval not displayed.    BNPNo results for input(s): BNP, PROBNP in the last 168 hours.   DDimer No results for input(s): DDIMER in the last 168 hours.   Radiology    PERIPHERAL VASCULAR CATHETERIZATION  Result Date: 06/15/2020 Patient name: Raymond [name] MRN: [id number] DOB: 1956-05-18 Sex: male 06/15/2020 Pre-operative Diagnosis: Right lower extremity claudication Post-operative diagnosis:  Same Surgeon:  Erlene Quan C. Donzetta Matters, MD Procedure Performed: 1.  Ultrasound-guided cannulation left common femoral artery 2.  Aortogram with bilateral lower extremity runoff 3.  Stent of right common and external iliac arteries with  9 x 37 mm biliary express balloon expandable stent 4.  Minx device closure left common femoral artery 5.  Moderate sedation with fentanyl and Versed for 40 minutes Indications: 64 year old male with esrd and previous left common femoral to below-knee popliteal artery bypass.  He now has significant life limiting claudication on the right he is indicated for aortogram possible intervention. Findings: His aorta bilateral common iliac arteries were heavily calcified.  On the right side his common down to his external was subtotally occluded with approximately 95% stenosis.  After stenting this is resolved to less than 20% stenosis there is brisk flow through the stent although some of the viewing is obstructed with calcium.  There was some flow that remained in the hypogastric artery on the right after stenting across it with a noncovered stent.  On the left side there is significant calcium and although wire was difficult to pass there was no gradient across the lesion.  The left common femoral bypass is patent I cannot see the distal anastomosis he appears to have runoff via the anterior tibial and peroneal arteries.  On the right side which is the site of interest he is a flush occlusion of the SFA profunda fills the thigh reconstitutes below-knee popliteal artery where he is 3 diminutive vessels running off to the ankle. Patient will be revisited in the office to evaluate for improvement in symptoms.  If he continues to have severe life limiting claudication we will plan for right common femoral to below-knee popliteal artery bypass.  I will have him follow-up with saphenous vein mapping in approximately 3 to 4 weeks.  Procedure:  The patient was identified in the holding area and taken to room 8.  The patient was then placed supine on the table and prepped and draped in the usual sterile fashion.  A time out was called.  Ultrasound was used to evaluate the left common femoral artery.  There was a bypass there the  artery with itself was quite large.  The  area was anesthetized 1% lidocaine.  There was significant scar tissue.  The artery itself was patent.  We cannulated with micropuncture needle followed the wire and sheath.  We had difficulty passing a wire we did this under fluoroscopic guidance placed a micropuncture sheath followed by Bentson wire.  Is 5 French sheath was placed.  We had to use a Glidewire advantage to get into the aorta and then placed an Omni catheter performed aortogram followed by pelvic views with LAO projections followed by bilateral lower extremity runoff with the above findings.  I then used a crossover catheter and Glidewire advantage to cross the subtotally occluded right common extending and external iliac arteries.  The patient was fully heparinized we placed a long 7 [place] sheath.  We then placed our 9 x 37 mm stent and placed retracted our sheath performed angiography and deployed our stent.  Completion demonstrated no residual stenosis.  We then did a pullback gradient across the left common iliac artery there was no significant gradient.  We exchanged for a short 7 [place] sheath deployed a minx device.  Unfortunately the patient developed significant scrotal swelling.  He remained hemodynamically stable.  Patient was already planned to be admitted overnight due to lack of transportation and family support and now will be admitted to watch his scrotal edema as well.  He otherwise tolerated the procedure well. Contrast: 150cc Brandon C. Donzetta Matters, MD [facility] [place] Office: [phone] Pager: [phone]   Cardiac Studies   N/a   Patient Profile     64 y.o. male with a history of mild CAD, anemia, COPD, ESRD on HD, Hepatitis C, HTN, PAD, thyroid disease, paroxysmal atrial fibrillation, tobacco abuse who was seen for the evaluation of atrial fibrillation at the request of Dr. Donzetta Matters.  Assessment & Plan    1. Atrial Flutter with RVR: rates remain elevated  (120s) and blood pressures are soft in the 85I systolic at times. Morning labetalol held because of low blood pressure. Will start IV amiodarone this morning to help with rates.  -- unable to anticoagulate with hematoma and anemia at this time    2. S/p right common/external leg artery stenting: developed significant scrotal hematoma and taken back to the OR with exploration and primary repair. Hgb 9.5 post transfusion yesterday. Further management per primary  3. ESRD on HD: nephrology following. Had HD yesterday.   For questions or updates, please contact [facility] Please consult [email or web address] for contact info under        Signed, Reino Bellis, NP  06/17/2020, 9:49 AM    I have personally seen and examined this patient. I agree with the assessment and plan as outlined above.  Unclear how long he has been in atrial flutter. No telemetry available to review prior to HD session yesterday. With elevated HR, begin amiodarone infusion for now. We will follow with you.   Lauree Chandler 06/17/2020 10:09 AM

## 2020-06-17 NOTE — Anesthesia Postprocedure Evaluation (Signed)
Anesthesia Post Note  Patient: Raymond [name]  Procedure(s) Performed: GROIN  EXPLORATION (Left )     Patient location during evaluation: PACU Anesthesia Type: General Level of consciousness: awake and alert Pain management: pain level controlled Vital Signs Assessment: post-procedure vital signs reviewed and stable Respiratory status: spontaneous breathing, nonlabored ventilation, respiratory function stable and patient connected to nasal cannula oxygen Cardiovascular status: blood pressure returned to baseline and stable Postop Assessment: no apparent nausea or vomiting Anesthetic complications: no   No complications documented.  Last Vitals:  Vitals:   06/16/20 2005 06/17/20 0434  BP: (!) 84/64 92/64  Pulse: 92 93  Resp: 16 16  Temp: 36.7 C 36.4 C  SpO2: 98% 96%    Last Pain:  Vitals:   06/17/20 0825  TempSrc:   PainSc: 7                  Jaionna Weisse

## 2020-06-18 DIAGNOSIS — I483 Typical atrial flutter: Secondary | ICD-10-CM | POA: Diagnosis not present

## 2020-06-18 MED ORDER — ACETAMINOPHEN 325 MG PO TABS
ORAL_TABLET | ORAL | Status: AC
  Administered 2020-06-18: 650 mg via ORAL
  Filled 2020-06-18: qty 2

## 2020-06-18 MED ORDER — OXYCODONE HCL 5 MG PO TABS
ORAL_TABLET | ORAL | Status: AC
  Administered 2020-06-18: 5 mg via ORAL
  Filled 2020-06-18: qty 1

## 2020-06-18 MED ORDER — CALCITRIOL 0.5 MCG PO CAPS
ORAL_CAPSULE | ORAL | Status: AC
  Filled 2020-06-18: qty 3

## 2020-06-18 NOTE — Progress Notes (Addendum)
  Progress Note    06/18/2020 8:47 AM 3 Days Post-Op  Subjective:  No complaints   Vitals:   06/18/20 0200 06/18/20 0400  BP: 112/64 116/66  Pulse: 82 82  Resp: 16 16  Temp: 98.8 F (37.1 C) 98.7 F (37.1 C)  SpO2: 97% 98%   Physical Exam: Lungs:  Non labored Incisions:  L groin without hematoma Extremities:  Feet warm; palpable femoral pulses Neurologic: A&O  CBC    Component Value Date/Time   WBC 3.3 (L) 06/17/2020 1925   RBC 2.96 (L) 06/17/2020 1925   HGB 9.5 (L) 06/17/2020 1925   HGB 11.0 (L) 03/27/2018 1420   HCT 30.7 (L) 06/17/2020 1925   HCT 32.7 (L) 03/27/2018 1420   PLT 87 (L) 06/17/2020 1925   PLT 174 03/27/2018 1420   MCV 103.7 (H) 06/17/2020 1925   MCV 101 (H) 03/27/2018 1420   MCH 32.1 06/17/2020 1925   MCHC 30.9 06/17/2020 1925   RDW 21.2 (H) 06/17/2020 1925   RDW 14.6 03/27/2018 1420   LYMPHSABS 2.0 01/14/2020 0600   LYMPHSABS 0.9 03/27/2018 1420   MONOABS 0.9 01/14/2020 0600   EOSABS 0.1 01/14/2020 0600   EOSABS 0.1 03/27/2018 1420   BASOSABS 0.0 01/14/2020 0600   BASOSABS 0.0 03/27/2018 1420    BMET    Component Value Date/Time   NA 136 06/17/2020 1925   NA 140 05/15/2018 1632   K 4.4 06/17/2020 1925   CL 92 (L) 06/17/2020 1925   CO2 32 06/17/2020 1925   GLUCOSE 96 06/17/2020 1925   BUN 31 (H) 06/17/2020 1925   BUN 38 (H) 05/15/2018 1632   CREATININE 9.86 (H) 06/17/2020 1925   CALCIUM 8.6 (L) 06/17/2020 1925   GFRNONAA 5 (L) 06/17/2020 1925   GFRAA 6 (L) 01/18/2020 1232    INR    Component Value Date/Time   INR 1.23 08/19/2018 0528     Intake/Output Summary (Last 24 hours) at 06/18/2020 0847 Last data filed at 06/18/2020 0600 Gross per 24 hour  Intake 609.13 ml  Output 0 ml  Net 609.13 ml     Assessment/Plan:  64 y.o. male is s/p R CIA and EIA stenting with subsequent L CFA repair 3 Days Post-Op   Feet well perfused Check CBC this morning; can likely start [facility] if stable Possible discharge pending the above if ok  with Cardiology   Dagoberto Ligas, PA-C [facility] [phone] 06/18/2020 8:47 AM  I have independently interviewed and examined patient on dialysis and agree with PA assessment and plan above.  We will evaluate for oral anticoagulation given that H&H is stable.  Plan to mobilize more and discharge when pain is well controlled.  Rylin Seavey C. Donzetta Matters, MD [facility] [place] Office: [phone] Pager: [phone]

## 2020-06-18 NOTE — Progress Notes (Addendum)
Progress Note  Patient Name: Raymond [name] Date of Encounter: 06/18/2020  [facility] Cardiologist: Jenkins Rouge, MD   Subjective   Doing ok this morning. Eating breakfast.   Inpatient Medications    Scheduled Meds: . aspirin EC  81 mg Oral Daily  . calcitRIOL  1.5 mcg Oral Q T,Th,Sa-HD  . clopidogrel  75 mg Oral Daily  . darbepoetin (ARANESP) injection - DIALYSIS  60 mcg Intravenous Q Tue-HD  . docusate sodium  100 mg Oral Daily  . heparin  5,000 Units Subcutaneous Q8H  . labetalol  200 mg Oral BID  . pantoprazole  40 mg Oral Daily  . rosuvastatin  10 mg Oral Daily  . sevelamer carbonate  3,200 mg Oral TID WC  . sodium chloride flush  3 mL Intravenous Q12H   Continuous Infusions: . sodium chloride    . sodium chloride    . sodium chloride    . sodium chloride    . amiodarone 30 mg/hr (06/18/20 0512)   PRN Meds: sodium chloride, sodium chloride, sodium chloride, acetaminophen, albuterol, alteplase, bisacodyl, bismuth subsalicylate, heparin, hydrALAZINE, HYDROmorphone (DILAUDID) injection, labetalol, lidocaine (PF), lidocaine-prilocaine, metoprolol tartrate, ondansetron (ZOFRAN) IV, oxyCODONE, pentafluoroprop-tetrafluoroeth, QUEtiapine, senna-docusate, sodium chloride flush   Vital Signs    Vitals:   06/17/20 2200 06/18/20 0000 06/18/20 0200 06/18/20 0400  BP: 111/74 110/68 112/64 116/66  Pulse: 89 82 82 82  Resp: 16 16 16 16   Temp:  98.8 F (37.1 C) 98.8 F (37.1 C) 98.7 F (37.1 C)  TempSrc:  Oral Oral Oral  SpO2: 98% 98% 97% 98%  Weight:      Height:        Intake/Output Summary (Last 24 hours) at 06/18/2020 0801 Last data filed at 06/18/2020 0600 Gross per 24 hour  Intake 609.13 ml  Output 0 ml  Net 609.13 ml   Last 3 Weights 06/16/2020 06/16/2020 06/16/2020  Weight (lbs) 163 lb 2.3 oz 167 lb 8.8 oz 165 lb 9.1 oz  Weight (kg) 74 kg 76 kg 75.1 kg      Telemetry    Aflutter rates in the 90s - Personally Reviewed  ECG    No new  tracing  Physical Exam  Pleasant older male, sitting up in bed GEN: No acute distress.   Neck: No JVD Cardiac: Irreg Irreg, no murmurs, rubs, or gallops.  Respiratory: Clear to auscultation bilaterally. GI: Soft, nontender, non-distended  MS: No edema; No deformity. Neuro:  Nonfocal  Psych: Normal affect   Labs    High Sensitivity Troponin:  No results for input(s): TROPONINIHS in the last 720 hours.    Chemistry Recent Labs  Lab 06/16/20 0338 06/17/20 0359 06/17/20 1925  NA 135 137 136  K 6.8* 3.9 4.4  CL 95* 93* 92*  CO2 26 29 32  GLUCOSE 177* 105* 96  BUN 75* 24* 31*  CREATININE 16.86* 9.22* 9.86*  CALCIUM 7.9* 7.9* 8.6*  ALBUMIN  --  2.8* 2.9*  GFRNONAA 3* 6* 5*  ANIONGAP 14 15 12      Hematology Recent Labs  Lab 06/16/20 0338 06/16/20 1040 06/16/20 1834 06/17/20 1925  WBC 2.7* 3.1*  --  3.3*  RBC 2.19* 2.45*  --  2.96*  HGB 7.4* 8.3* 9.5* 9.5*  HCT 23.8* 25.1* 28.5* 30.7*  MCV 108.7* 102.4*  --  103.7*  MCH 33.8 33.9  --  32.1  MCHC 31.1 33.1  --  30.9  RDW 16.5* 19.4*  --  21.2*  PLT 90* 42*  --  87*    BNPNo results for input(s): BNP, PROBNP in the last 168 hours.   DDimer No results for input(s): DDIMER in the last 168 hours.   Radiology    No results found.  Cardiac Studies   N/a   Patient Profile     64 y.o. male with a history of mild CAD, anemia, COPD, ESRD on HD, Hepatitis C, HTN, PAD, thyroid disease, paroxysmal atrial fibrillation, tobacco abusewho was seen for the evaluation of atrial fibrillationat the request ofDr. Donzetta Matters.  Assessment & Plan    1. Atrial Flutter with RVR: rates have significantly improved today. Mostly in the 90s. Blood pressures are soft but stable. Was able to get his labetalol last evening as well. Unclear how long he has been in this rhythm.  -- This patients CHA2DS2-VASc Score of at least 3, will need to consider Wayne once stable from a VVS standpoint  2. S/p right common/external leg artery stenting:  developed significant scrotal hematoma and taken back to the OR with exploration and primary repair. Hgb 9.5 post transfusion. Further management per primary  3. ESRD on HD: nephrology following.  For questions or updates, please contact [place] Please consult [email or web address] for contact info under        Signed, Reino Bellis, NP  06/18/2020, 8:01 AM    I have personally seen and examined this patient. I agree with the assessment and plan as outlined above.  Rate is better controlled on IV amiodarone. Continue for today. Will likely change to po amiodarone tomorrow. He will need consideration for long term anti-coagulation once bleeding issues resolved.   Lauree Chandler 06/18/2020 8:58 AM

## 2020-06-18 NOTE — Care Management (Signed)
1624 06-18-20 Benefits check submitted for Xarelto and Eliquis. Case Manager will follow for cost. Graves-Bigelow, Ocie Cornfield, RN,BSN Case Manager

## 2020-06-18 NOTE — Evaluation (Signed)
Physical Therapy Evaluation Patient Details Name: Raymond [name] MRN: [id number] DOB: 11/06/1955 Today's Date: 06/18/2020   History of Present Illness  Pt is 64 yo male with PMH including L femoral below -knee popliteal bypass, CAD, COPD, HTN, PAD.  Pt presented with R LE claudication. Pt s/p R common and external artery stenting 06/15/20 with development of scrotal hematoma on L with failure of minx device and had to return to surgery later that day for repair and evacuation of hematoma. Also, complicated by aflutter with RVR.     Clinical Impression  Pt admitted with above diagnosis. Pt was significantly limited at eval due to pain with movement.  With attempts to stand, pt with severe pain and reports tightness and pulling.  Will need premedication for pain next visit -also, possibility that scrotal sling could reduce pulling sensation.  Of note, pt reports has not been OOB since procedure 3 days ago. Pt reports he is normally independent and has little support at discharge.  After session, pt was distracted by pain and unable to discuss/plan d/c recommendations.  Due to living alone and limited mobility, PT recommends SNF at this time; however, unsure if pt will be agreeable and may improve as pain is controlled - will need ongoing assessment. Pt currently with functional limitations due to the deficits listed below (see PT Problem List). Pt will benefit from skilled PT to increase their independence and safety with mobility to allow discharge to the venue listed below.       Follow Up Recommendations SNF;Other (comment)    Equipment Recommendations  Rolling walker with 5" wheels;3in1 (PT)    Recommendations for Other Services       Precautions / Restrictions Precautions Precautions: Fall Precaution Comments: Pain - premedicate      Mobility  Bed Mobility Overal bed mobility: Needs Assistance Bed Mobility: Supine to Sit;Sit to Supine     Supine to sit: Min guard;HOB elevated Sit  to supine: Min guard;HOB elevated   General bed mobility comments: Min guard but significantly increased time, effort, use of bed rails, and pain    Transfers Overall transfer level: Needs assistance Equipment used: Rolling walker (2 wheeled) Transfers: Sit to/from Stand Sit to Stand: Min guard         General transfer comment: sit to stand x 3 but unable to stand fully upright due to pain; required increased time, bed elevated, and cues for safe hand placement  Ambulation/Gait             General Gait Details: limited by pain  Stairs            Wheelchair Mobility    Modified Rankin (Stroke Patients Only)       Balance Overall balance assessment: Needs assistance Sitting-balance support: Bilateral upper extremity supported Sitting balance-Leahy Scale: Fair Sitting balance - Comments: Used bil LE but to relieve pain, not for balance   Standing balance support: Bilateral upper extremity supported Standing balance-Leahy Scale: Poor Standing balance comment: required use of RW                             Pertinent Vitals/Pain Pain Assessment: 0-10 Pain Score: 10-Worst pain ever Pain Location: groin/scrotum with mobility Pain Descriptors / Indicators: Sharp;Pressure;Tightness Pain Intervention(s): Limited activity within patient's tolerance;Monitored during session;Patient requesting pain meds-RN notified;Repositioned;Relaxation;Ice applied    Home Living Family/patient expects to be discharged to:: Private residence Living Arrangements: Alone Available Help at Discharge: Family;Available  PRN/intermittently Type of Home: Apartment Home Access: Elevator     Home Layout: One level Home Equipment: Grab bars - toilet;Grab bars - tub/shower      Prior Function Level of Independence: Independent         Comments: Pt independent with ADLs, IADLs, and community ambulation.     Hand Dominance        Extremity/Trunk Assessment   Upper  Extremity Assessment Upper Extremity Assessment: Overall WFL for tasks assessed    Lower Extremity Assessment Lower Extremity Assessment: LLE deficits/detail;RLE deficits/detail RLE Deficits / Details: ROM WFL; MMT: at least 3/5 but not further tested due to pain LLE Deficits / Details: ROM WFL; MMT: at least 3/5 but not further tested due to pain    Cervical / Trunk Assessment Cervical / Trunk Assessment: Other exceptions Cervical / Trunk Exceptions: scrotal edema and difficulty standing upright due to pain  Communication   Communication: No difficulties  Cognition Arousal/Alertness: Awake/alert Behavior During Therapy: WFL for tasks assessed/performed Overall Cognitive Status: Within Functional Limits for tasks assessed                                        General Comments General comments (skin integrity, edema, etc.): Pt reports has not been up since procedures. Pt had reported minimal discomfort at rest, but with transfers and standing his pain increased to 10/10.  Described as tightness and pulling and was unable to progress.   Returned to supine, RN notified and gave pt medication and PT place icepack to L groin area. Discussed with pt importance of mobility and will incorporate premedication prior to next attempt.   Pt was on 2 LPM O2 at arrival with sats 100%.  Tried on RA and sats remained 97% or >.  Left on RA and notified RN.     Exercises     Assessment/Plan    PT Assessment Patient needs continued PT services  PT Problem List Decreased strength;Decreased mobility;Decreased safety awareness;Decreased activity tolerance;Decreased balance;Decreased knowledge of use of DME;Pain;Cardiopulmonary status limiting activity       PT Treatment Interventions DME instruction;Therapeutic activities;Gait training;Therapeutic exercise;Patient/family education;Stair training;Balance training;Functional mobility training;Modalities    PT Goals (Current goals can be  found in the Care Plan section)  Acute Rehab PT Goals Patient Stated Goal: decrease pain PT Goal Formulation: With patient Time For Goal Achievement: 07/02/20 Potential to Achieve Goals: Good    Frequency Min 3X/week   Barriers to discharge Decreased caregiver support      Co-evaluation               AM-PAC PT "6 Clicks" Mobility  Outcome Measure Help needed turning from your back to your side while in a flat bed without using bedrails?: A Little Help needed moving from lying on your back to sitting on the side of a flat bed without using bedrails?: A Little Help needed moving to and from a bed to a chair (including a wheelchair)?: A Lot Help needed standing up from a chair using your arms (e.g., wheelchair or bedside chair)?: A Little Help needed to walk in hospital room?: A Lot Help needed climbing 3-5 steps with a railing? : A Lot 6 Click Score: 15    End of Session Equipment Utilized During Treatment: Gait belt Activity Tolerance: Patient limited by pain Patient left: in bed;with call bell/phone within reach;with bed alarm set Nurse Communication: Mobility status;Patient  requests pain meds PT Visit Diagnosis: Pain;Other abnormalities of gait and mobility (R26.89);Muscle weakness (generalized) (M62.81) Pain - Right/Left: Left Pain - part of body:  (groin)    Time: 6435-3912 PT Time Calculation (min) (ACUTE ONLY): 33 min   Charges:   PT Evaluation $PT Eval Moderate Complexity: 1 Mod PT Treatments $Therapeutic Activity: 8-22 mins        Abran Richard, PT Acute Rehab Services Pager [phone] [facility] [phone]    Karlton Lemon 06/18/2020, 5:35 PM

## 2020-06-18 NOTE — Progress Notes (Signed)
PT Cancellation Note  Patient Details Name: Raymond [name] MRN: [id number] DOB: 1955/09/17   Cancelled Treatment:    Reason Eval/Treat Not Completed: Patient at procedure or test/unavailable  Pt just left for HD.  Will f/u later as able. Abran Richard, PT Acute Rehab Services Pager [phone] [facility] [place] 06/18/2020, 11:01 AM

## 2020-06-18 NOTE — Progress Notes (Signed)
[facility] NEPHROLOGY PROGRESS NOTE  Assessment/ Plan: Pt is a 64 y.o. yo male with ESRD on HD, anemia who underwent vascular procedure on 12/6, consulted for ESRD management. OP HD orders: [facility], outpatient orders: 4hrs, F180, 450/800, 2K, 2Ca, 137Na, 35Bicarb UF Profile 4, no heparin  #PAD underwent right common and external leg artery stenting on 12/6 by vascular.  Scrotal hematoma, vascular is following.  # ESRD: TTS: Plan for dialysis today.  # Anemia due to ESRD and ABLA during surgery: Drop in hemoglobin.  Continue ESA.  Transfuse blood as needed.  # Secondary hyperparathyroidism: Continue calcitriol, holding Sensipar because of hypercalcemia.  Monitor Ca, phosphorus level.  # HTN/volume: Off of antihypertensive because of hypotension, may need midodrine during HD.  Asymptomatic.  #AMS likely due to sedatives: Mental status improved.  Recommend to avoid or at least minimize sedatives.  #Hyperkalemia: Managed with dialysis.  #Atrial flutter with RVR: Unable to receive beta-blocker because of hypotension.  Starting amiodarone by cardiologist.  Faythe Ghee to discharge from renal perspective.  Subjective: Seen and examined at bedside.  Looks alert awake and comfortable.  Denies chest pain, shortness of breath.  Plan for HD today.  Objective Vital signs in last 24 hours: Vitals:   06/18/20 0000 06/18/20 0200 06/18/20 0400 06/18/20 0841  BP: 110/68 112/64 116/66 131/89  Pulse: 82 82 82 86  Resp: 16 16 16 16   Temp: 98.8 F (37.1 C) 98.8 F (37.1 C) 98.7 F (37.1 C) 98.4 F (36.9 C)  TempSrc: Oral Oral Oral Oral  SpO2: 98% 97% 98% 97%  Weight:      Height:       Weight change:   Intake/Output Summary (Last 24 hours) at 06/18/2020 1034 Last data filed at 06/18/2020 0600 Gross per 24 hour  Intake 609.13 ml  Output 0 ml  Net 609.13 ml       Labs: Basic Metabolic Panel: Recent Labs  Lab 06/16/20 0338 06/17/20 0359 06/17/20 1925  NA 135 137 136  K  6.8* 3.9 4.4  CL 95* 93* 92*  CO2 26 29 32  GLUCOSE 177* 105* 96  BUN 75* 24* 31*  CREATININE 16.86* 9.22* 9.86*  CALCIUM 7.9* 7.9* 8.6*  PHOS  --  4.8* 5.3*   Liver Function Tests: Recent Labs  Lab 06/17/20 0359 06/17/20 1925  ALBUMIN 2.8* 2.9*   No results for input(s): LIPASE, AMYLASE in the last 168 hours. No results for input(s): AMMONIA in the last 168 hours. CBC: Recent Labs  Lab 06/15/20 1855 06/16/20 0338 06/16/20 1040 06/16/20 1834 06/17/20 1925  WBC 4.5 2.7* 3.1*  --  3.3*  HGB 9.0* 7.4* 8.3* 9.5* 9.5*  HCT 29.6* 23.8* 25.1* 28.5* 30.7*  MCV 111.3* 108.7* 102.4*  --  103.7*  PLT 101* 90* 42*  --  87*   Cardiac Enzymes: No results for input(s): CKTOTAL, CKMB, CKMBINDEX, TROPONINI in the last 168 hours. CBG: Recent Labs  Lab 06/15/20 2034 06/15/20 2340 06/16/20 0419  GLUCAP 169* 182* 187*    Iron Studies: No results for input(s): IRON, TIBC, TRANSFERRIN, FERRITIN in the last 72 hours. Studies/Results: No results found.  Medications: Infusions: . sodium chloride    . sodium chloride 10 mL/hr at 06/18/20 0955  . sodium chloride    . sodium chloride    . amiodarone 30 mg/hr (06/18/20 1006)    Scheduled Medications: . aspirin EC  81 mg Oral Daily  . calcitRIOL  1.5 mcg Oral Q T,Th,Sa-HD  . clopidogrel  75 mg  Oral Daily  . darbepoetin (ARANESP) injection - DIALYSIS  60 mcg Intravenous Q Tue-HD  . docusate sodium  100 mg Oral Daily  . heparin  5,000 Units Subcutaneous Q8H  . labetalol  200 mg Oral BID  . pantoprazole  40 mg Oral Daily  . rosuvastatin  10 mg Oral Daily  . sevelamer carbonate  3,200 mg Oral TID WC  . sodium chloride flush  3 mL Intravenous Q12H    have reviewed scheduled and prn medications.  Physical Exam: General: Alert awake and comfortable. Heart:RRR, s1s2 nl Lungs:clear b/l, no crackle Abdomen:soft, Non-tender, non-distended Extremities:trace edema Dialysis Access: Left upper extremity AV fistula has good thrill and  bruit.  Newell Wafer Prasad Mercy Leppla 06/18/2020,10:34 AM  LOS: 3 days  Pager: [phone]

## 2020-06-19 LAB — CBC
HCT: 29.6 % — ABNORMAL LOW (ref 39.0–52.0)
Hemoglobin: 9.3 g/dL — ABNORMAL LOW (ref 13.0–17.0)
MCH: 32.6 pg (ref 26.0–34.0)
MCHC: 31.4 g/dL (ref 30.0–36.0)
MCV: 103.9 fL — ABNORMAL HIGH (ref 80.0–100.0)
Platelets: 100 10*3/uL — ABNORMAL LOW (ref 150–400)
RBC: 2.85 MIL/uL — ABNORMAL LOW (ref 4.22–5.81)
RDW: 20.2 % — ABNORMAL HIGH (ref 11.5–15.5)
WBC: 3.6 10*3/uL — ABNORMAL LOW (ref 4.0–10.5)
nRBC: 0 % (ref 0.0–0.2)

## 2020-06-19 MED ORDER — AMIODARONE HCL 200 MG PO TABS
400.0000 mg | ORAL_TABLET | Freq: Two times a day (BID) | ORAL | Status: AC
  Administered 2020-06-19 – 2020-06-22 (×7): 400 mg via ORAL
  Filled 2020-06-19 (×7): qty 2

## 2020-06-19 MED ORDER — AMIODARONE HCL 200 MG PO TABS: 400.0000 mg | ORAL_TABLET | Freq: Every day | ORAL | Status: DC

## 2020-06-19 MED ORDER — APIXABAN 5 MG PO TABS
5.0000 mg | ORAL_TABLET | Freq: Two times a day (BID) | ORAL | Status: DC
  Administered 2020-06-19 – 2020-06-22 (×7): 5 mg via ORAL
  Filled 2020-06-19 (×7): qty 1

## 2020-06-19 MED ORDER — CHLORHEXIDINE GLUCONATE CLOTH 2 % EX PADS: 6.0000 | MEDICATED_PAD | Freq: Every day | CUTANEOUS | Status: DC

## 2020-06-19 MED ORDER — LABETALOL HCL 200 MG PO TABS: 100.0000 mg | ORAL_TABLET | Freq: Two times a day (BID) | ORAL | Status: DC

## 2020-06-19 MED ORDER — AMIODARONE HCL 200 MG PO TABS: 200.0000 mg | ORAL_TABLET | Freq: Every day | ORAL | Status: DC

## 2020-06-19 NOTE — Progress Notes (Addendum)
[facility] NEPHROLOGY PROGRESS NOTE  Assessment/ Plan: Pt is a 64 y.o. yo male with ESRD on HD, anemia who underwent vascular procedure on 12/6, consulted for ESRD management. OP HD orders: [facility], outpatient orders: 4hrs, F180, 450/800, 2K, 2Ca, 137Na, 35Bicarb UF Profile 4, no heparin  #PAD underwent right common and external leg artery stenting on 12/6 by vascular.  Scrotal hematoma, vascular is following.  # ESRD: TTS: Status post dialysis yesterday with 1.5 L UF, tolerated well.  Plan for regular dialysis tomorrow.  This can be done outpatient if patient is discharged.  # Anemia due to ESRD and ABLA during surgery: Drop in hemoglobin.  Continue ESA.  Transfuse blood as needed.  # Secondary hyperparathyroidism: Continue calcitriol, holding Sensipar because of hypocalcemia.  Monitor Ca, phosphorus level.  # HTN/volume: Blood pressure 80-90s today, not getting labetalol regularly because of hypotension.  He is on amiodarone now per cardiologist.  I will discontinue labetalol from the med list.  He may need midodrine during HD.  Asymptomatic.  #AMS likely due to sedatives: Mental status improved.  Recommend to avoid or at least minimize sedatives.  #Hyperkalemia: Managed with dialysis.  #Atrial flutter with RVR: Unable to receive beta-blocker because of hypotension.  Started amiodarone by cardiologist.  #Disposition: Plan to discharge to SNF noted.  Okay to discharge from renal perspective.  Subjective: Seen and examined at bedside.  Doing well.  Denies nausea vomiting chest pain shortness of breath.  Tolerating dialysis well.  Objective Vital signs in last 24 hours: Vitals:   06/18/20 2237 06/19/20 0037 06/19/20 0537 06/19/20 0838  BP: 120/78 102/73 105/70 93/62  Pulse:  94 92 93  Resp:   (!) 22   Temp:   99.7 F (37.6 C)   TempSrc:   Oral   SpO2:   100%   Weight:   74 kg   Height:       Weight change:   Intake/Output Summary (Last 24 hours) at 06/19/2020  0953 Last data filed at 06/19/2020 0600 Gross per 24 hour  Intake 571.45 ml  Output 1500 ml  Net -928.55 ml       Labs: Basic Metabolic Panel: Recent Labs  Lab 06/16/20 0338 06/17/20 0359 06/17/20 1925  NA 135 137 136  K 6.8* 3.9 4.4  CL 95* 93* 92*  CO2 26 29 32  GLUCOSE 177* 105* 96  BUN 75* 24* 31*  CREATININE 16.86* 9.22* 9.86*  CALCIUM 7.9* 7.9* 8.6*  PHOS  --  4.8* 5.3*   Liver Function Tests: Recent Labs  Lab 06/17/20 0359 06/17/20 1925  ALBUMIN 2.8* 2.9*   No results for input(s): LIPASE, AMYLASE in the last 168 hours. No results for input(s): AMMONIA in the last 168 hours. CBC: Recent Labs  Lab 06/15/20 1855 06/16/20 0338 06/16/20 1040 06/16/20 1834 06/17/20 1925 06/19/20 0038  WBC 4.5 2.7* 3.1*  --  3.3* 3.6*  HGB 9.0* 7.4* 8.3* 9.5* 9.5* 9.3*  HCT 29.6* 23.8* 25.1* 28.5* 30.7* 29.6*  MCV 111.3* 108.7* 102.4*  --  103.7* 103.9*  PLT 101* 90* 42*  --  87* 100*   Cardiac Enzymes: No results for input(s): CKTOTAL, CKMB, CKMBINDEX, TROPONINI in the last 168 hours. CBG: Recent Labs  Lab 06/15/20 2034 06/15/20 2340 06/16/20 0419  GLUCAP 169* 182* 187*    Iron Studies: No results for input(s): IRON, TIBC, TRANSFERRIN, FERRITIN in the last 72 hours. Studies/Results: No results found.  Medications: Infusions: . sodium chloride    . sodium chloride  10 mL/hr at 06/19/20 0600    Scheduled Medications: . amiodarone  400 mg Oral BID   Followed by  . [START ON 06/23/2020] amiodarone  400 mg Oral Daily   Followed by  . [START ON 06/30/2020] amiodarone  200 mg Oral Daily  . aspirin EC  81 mg Oral Daily  . calcitRIOL  1.5 mcg Oral Q T,Th,Sa-HD  . clopidogrel  75 mg Oral Daily  . darbepoetin (ARANESP) injection - DIALYSIS  60 mcg Intravenous Q Tue-HD  . docusate sodium  100 mg Oral Daily  . heparin  5,000 Units Subcutaneous Q8H  . labetalol  200 mg Oral BID  . pantoprazole  40 mg Oral Daily  . rosuvastatin  10 mg Oral Daily  . sevelamer  carbonate  3,200 mg Oral TID WC  . sodium chloride flush  3 mL Intravenous Q12H    have reviewed scheduled and prn medications.  Physical Exam: General: Alert awake and comfortable. Heart:RRR, s1s2 nl Lungs: Clear b/l, no crackle Abdomen:soft, Non-tender, non-distended Extremities:trace edema Dialysis Access: Left upper extremity AV fistula has good thrill and bruit.  Sharelle Burditt Prasad Vershawn Westrup 06/19/2020,9:53 AM  LOS: 4 days  Pager: [phone]

## 2020-06-19 NOTE — [place] FL2 (Signed)
[place] MEDICAID FL2 LEVEL OF CARE SCREENING TOOL     IDENTIFICATION  Patient Name: Raymond [name] Birthdate: Oct 24, 1955 Sex: male Admission Date (Current Location): 06/15/2020  [facility] and [place] Number:  Producer, television/film/video and Address:  [facility], [address], [place], [place] [address]      Provider Number: [id number]  Attending Physician Name and Address:  Juventino Slovak*  Relative Name and Phone Number:  Lerone Chavoya [phone]    Current Level of Care: Hospital Recommended Level of Care: Skilled Nursing Facility Prior Approval Number:    Date Approved/Denied:   PASRR Number: [id number]  Discharge Plan: SNF    Current Diagnoses: Patient Active Problem List   Diagnosis Date Noted  . History of arteriovenous malformation (AVM) 01/14/2020  . UGIB (upper gastrointestinal bleed) 01/14/2020  . Acute blood loss anemia 01/14/2020  . Elevated troponin 01/14/2020  . Gastroenteritis 12/24/2018  . Substance use disorder 12/24/2018  . Noncompliance of patient with renal dialysis (HCC) 09/12/2018  . Benign neoplasm of colon   . Melena   . Anemia due to chronic kidney disease, on chronic dialysis (HCC)   . Acute on chronic anemia   . H/O medication noncompliance   . Polysubstance (excluding opioids) dependence (HCC)   . Atrial flutter with rapid ventricular response (HCC) 08/10/2018  . PAF (paroxysmal atrial fibrillation) (HCC) 08/01/2018  . Acute respiratory failure with hypoxia (HCC) 07/13/2018  . Non-compliance with renal dialysis (HCC) 07/13/2018  . PAD (peripheral artery disease) (HCC) 04/11/2018  . Sepsis (HCC) 03/31/2018  . Preop cardiovascular exam 03/29/2018  . Volume overload 08/07/2017  . Acute pulmonary edema (HCC)   . Dyspnea 06/20/2017  . Acute bronchitis   . COPD (chronic obstructive pulmonary disease) (HCC)   . End-stage renal disease on hemodialysis (HCC)   . Hypoxia 12/05/2016  . Hyperkalemia 12/19/2015   . Hypoglycemia 12/19/2015  . Polysubstance abuse (HCC) 12/19/2015  . Homeless 12/19/2015  . Anemia associated with stage 5 chronic renal failure (HCC) 12/19/2015  . Atypical chest pain 12/19/2013  . Atherosclerosis of native arteries of extremity with intermittent claudication (HCC) 12/04/2012  . ESRD (end stage renal disease) (HCC) 12/04/2012  . HEPATITIS C 09/07/2006  . HYPERCHOLESTEROLEMIA 09/07/2006  . HYPERTENSION, BENIGN SYSTEMIC 09/07/2006    Orientation RESPIRATION BLADDER Height & Weight     Self,Time,Situation,Place  Normal Continent Weight: 163 lb 2.3 oz (74 kg) Height:  5\' 8"  (172.7 cm)  BEHAVIORAL SYMPTOMS/MOOD NEUROLOGICAL BOWEL NUTRITION STATUS      Continent Diet (see dc summary)  AMBULATORY STATUS COMMUNICATION OF NEEDS Skin   Limited Assist Verbally Surgical wounds (Left groin incision 06/15/20)                       Personal Care Assistance Level of Assistance  Bathing,Feeding,Dressing Bathing Assistance: Limited assistance Feeding assistance: Independent Dressing Assistance: Limited assistance     Functional Limitations Info  Sight,Hearing,Speech Sight Info: Adequate Hearing Info: Adequate Speech Info: Adequate    SPECIAL CARE FACTORS FREQUENCY  PT (By licensed PT),OT (By licensed OT)     PT Frequency: 5x week OT Frequency: 5x week            Contractures Contractures Info: Not present    Additional Factors Info  Code Status,Allergies Code Status Info: Full Allergies Info: NKA           Current Medications (06/19/2020):  This is the current hospital active medication list Current Facility-Administered Medications  Medication Dose Route Frequency Provider Last Rate Last Admin  . 0.9 %  sodium chloride infusion  250 mL Intravenous PRN Maeola Harman, MD      . 0.9 %  sodium chloride infusion   Intravenous Continuous Lars Mage, PA-C 10 mL/hr at 06/19/20 0600 Infusion Verify at 06/19/20 0600  . acetaminophen  (TYLENOL) tablet 650 mg  650 mg Oral Q4H PRN Maeola Harman, MD   650 mg at 06/18/20 1401  . albuterol (VENTOLIN HFA) 108 (90 Base) MCG/ACT inhaler 2 puff  2 puff Inhalation Q4H PRN Maeola Harman, MD      . amiodarone (PACERONE) tablet 400 mg  400 mg Oral BID Laverda Page B, NP   400 mg at 06/19/20 4259   Followed by  . [START ON 06/23/2020] amiodarone (PACERONE) tablet 400 mg  400 mg Oral Daily Arty Baumgartner, NP       Followed by  . [START ON 06/30/2020] amiodarone (PACERONE) tablet 200 mg  200 mg Oral Daily Laverda Page B, NP      . apixaban Everlene Balls) tablet 5 mg  5 mg Oral BID [name] Gallant M, PA-C   5 mg at 06/19/20 1212  . aspirin EC tablet 81 mg  81 mg Oral Daily Maeola Harman, MD   81 mg at 06/19/20 5638  . bisacodyl (DULCOLAX) EC tablet 5 mg  5 mg Oral Daily PRN [name] Gallant M, PA-C      . bismuth subsalicylate (PEPTO BISMOL) chewable tablet 524 mg  524 mg Oral PRN Maeola Harman, MD      . calcitRIOL (ROCALTROL) capsule 1.5 mcg  1.5 mcg Oral Q T,Th,Sa-HD Maxie Barb, MD   1.5 mcg at 06/18/20 1536  . Chlorhexidine Gluconate Cloth 2 % PADS 6 each  6 each Topical Q0600 Maxie Barb, MD      . clopidogrel (PLAVIX) tablet 75 mg  75 mg Oral Daily Mosetta Anis, RPH   75 mg at 06/19/20 7564  . Darbepoetin Alfa (ARANESP) injection 60 mcg  60 mcg Intravenous Q Tue-HD Maxie Barb, MD   60 mcg at 06/16/20 1356  . docusate sodium (COLACE) capsule 100 mg  100 mg Oral Daily [name] Gallant M, PA-C   100 mg at 06/19/20 3329  . hydrALAZINE (APRESOLINE) injection 5 mg  5 mg Intravenous Q20 Min PRN Maeola Harman, MD      . HYDROmorphone (DILAUDID) injection 0.5-1 mg  0.5-1 mg Intravenous Q2H PRN Maeola Harman, MD   0.5 mg at 06/18/20 1721  . labetalol (NORMODYNE) injection 10 mg  10 mg Intravenous Q10 min PRN Maeola Harman, MD      . metoprolol tartrate (LOPRESSOR) injection 2-5  mg  2-5 mg Intravenous Q2H PRN [name] Gallant M, PA-C      . ondansetron [facility]) injection 4 mg  4 mg Intravenous Q6H PRN Maeola Harman, MD      . oxyCODONE (Oxy IR/ROXICODONE) immediate release tablet 5-10 mg  5-10 mg Oral Q4H PRN Maeola Harman, MD   10 mg at 06/19/20 1610  . pantoprazole (PROTONIX) EC tablet 40 mg  40 mg Oral Daily Maeola Harman, MD   40 mg at 06/19/20 5188  . QUEtiapine (SEROQUEL) tablet 100 mg  100 mg Oral QHS PRN Maeola Harman, MD   100 mg at 06/18/20 2216  . rosuvastatin (CRESTOR) tablet 10 mg  10 mg Oral Daily Maeola Harman, MD   10  mg at 06/19/20 0837  . senna-docusate (Senokot-S) tablet 1 tablet  1 tablet Oral QHS PRN [name] Gallant M, PA-C      . sevelamer carbonate (RENVELA) tablet 3,200 mg  3,200 mg Oral TID WC Maeola Harman, MD   3,200 mg at 06/19/20 1609  . sodium chloride flush (NS) 0.9 % injection 3 mL  3 mL Intravenous Q12H Maeola Harman, MD   3 mL at 06/18/20 2208  . sodium chloride flush (NS) 0.9 % injection 3 mL  3 mL Intravenous PRN Maeola Harman, MD       Facility-Administered Medications Ordered in Other Encounters  Medication Dose Route Frequency Provider Last Rate Last Admin  . midazolam (VERSED) 5 MG/5ML injection    Anesthesia Intra-op Sonda Primes, CRNA   2 mg at 04/11/18 1042     Discharge Medications: Please see discharge summary for a list of discharge medications.  Relevant Imaging Results:  Relevant Lab Results:   Additional Information SSN [id number], PT HAS HD T,TH,S OFF OF [address]  Burrel Legrand B Darrah Dredge, LCSWA

## 2020-06-19 NOTE — Progress Notes (Signed)
Held labetalol for soft BP.  His SBP has been low 90s with HR in 80-90s.  Idolina Primer, RN

## 2020-06-19 NOTE — Progress Notes (Addendum)
     Subjective  - He is having difficulty with ambulation secondary to scrotal swelling.   Objective 105/70 92 99.7 F (37.6 C) (Oral) (!) 22 100%  Intake/Output Summary (Last 24 hours) at 06/19/2020 0749 Last data filed at 06/19/2020 0600 Gross per 24 hour  Intake 571.45 ml  Output 1500 ml  Net -928.55 ml    Feet warm with intact sensation and motor, well perfused Left groin without recurrent hematoma, scrotal area with decreased edema.   Assessment/Planning: POD # 4 64 y.o. male is s/p R CIA and EIA stenting with subsequent L CFA repair 3 Days Post-Op   Well perused B LE Pending possible SNF placement and DOAC affordability.   Encouraged ambulation. Pending further input from cardiology  [name] 06/19/2020 7:49 AM --  Laboratory Lab Results: Recent Labs    06/17/20 1925 06/19/20 0038  WBC 3.3* 3.6*  HGB 9.5* 9.3*  HCT 30.7* 29.6*  PLT 87* 100*   BMET Recent Labs    06/17/20 0359 06/17/20 1925  NA 137 136  K 3.9 4.4  CL 93* 92*  CO2 29 32  GLUCOSE 105* 96  BUN 24* 31*  CREATININE 9.22* 9.86*  CALCIUM 7.9* 8.6*    COAG Lab Results  Component Value Date   INR 1.23 08/19/2018   INR 1.27 08/18/2018   INR 1.55 08/13/2018   No results found for: PTT  I have independently interviewed and examined patient and agree with PA assessment and plan above. Working on [employer] for discharge. Will likely need snf secondary to issues mobility.  Kaydenn Mclear C. Donzetta Matters, MD [facility] Office: [phone] Pager: [phone]

## 2020-06-19 NOTE — Discharge Instructions (Addendum)
Information on my medicine - ELIQUIS (apixaban)  Why was Eliquis prescribed for you? Eliquis was prescribed for you to reduce the risk of a blood clot forming that can cause a stroke if you have a medical condition called atrial fibrillation (a type of irregular heartbeat).  What do You need to know about Eliquis ? Take your Eliquis TWICE DAILY - one tablet in the morning and one tablet in the evening with or without food. If you have difficulty swallowing the tablet whole please discuss with your pharmacist how to take the medication safely.  Take Eliquis exactly as prescribed by your doctor and DO NOT stop taking Eliquis without talking to the doctor who prescribed the medication.  Stopping may increase your risk of developing a stroke.  Refill your prescription before you run out.  After discharge, you should have regular check-up appointments with your healthcare provider that is prescribing your Eliquis.  In the future your dose may need to be changed if your kidney function or weight changes by a significant amount or as you get older.  What do you do if you miss a dose? If you miss a dose, take it as soon as you remember on the same day and resume taking twice daily.  Do not take more than one dose of ELIQUIS at the same time to make up a missed dose.  Important Safety Information A possible side effect of Eliquis is bleeding. You should call your healthcare provider right away if you experience any of the following: ? Bleeding from an injury or your nose that does not stop. ? Unusual colored urine (red or dark brown) or unusual colored stools (red or black). ? Unusual bruising for unknown reasons. ? A serious fall or if you hit your head (even if there is no bleeding).  Some medicines may interact with Eliquis and might increase your risk of bleeding or clotting while on Eliquis. To help avoid this, consult your healthcare provider or pharmacist prior to using any new  prescription or non-prescription medications, including herbals, vitamins, non-steroidal anti-inflammatory drugs (NSAIDs) and supplements.  This website has more information on Eliquis (apixaban): http://www.eliquis.com/eliquis/home    [facility]  Discharge Instructions  Lower Extremity Angiogram; Angioplasty/Stenting  Please refer to the following instructions for your post-procedure care. Your surgeon or physician assistant will discuss any changes with you.  Activity  Avoid lifting more than 8 pounds (1 gallons of milk) for 5 days after your procedure. You may walk as much as you can tolerate. It's OK to drive after 72 hours.  Bathing/Showering  You may shower the day after your procedure. If you have a bandage, you may remove it at 24- 48 hours. Clean your incision site with mild soap and water. Pat the area dry with a clean towel.  Diet  Resume your pre-procedure diet. There are no special food restrictions following this procedure. All patients with peripheral vascular disease should follow a low fat/low cholesterol diet. In order to heal from your surgery, it is CRITICAL to get adequate nutrition. Your body requires vitamins, minerals, and protein. Vegetables are the best source of vitamins and minerals. Vegetables also provide the perfect balance of protein. Processed food has little nutritional value, so try to avoid this.  Medications  Resume taking all of your medications unless your doctor tells you not to. If your incision is causing pain, you may take over-the-counter pain relievers such as acetaminophen (Tylenol)  Follow Up  Follow up  will be arranged at the time of your procedure. You may have an office visit scheduled or may be scheduled for surgery. Ask your surgeon if you have any questions.  Please call us immediately for any of the following conditions: .Severe or worsening pain your legs or feet at rest or with  walking. .Increased pain, redness, drainage at your groin puncture site. .Fever of 101 degrees or higher. .If you have any mild or slow bleeding from your puncture site: lie down, apply firm constant pressure over the area with a piece of gauze or a clean wash cloth for 30 minutes- no peeking!, call 911 right away if you are still bleeding after 30 minutes, or if the bleeding is heavy and unmanageable.  Reduce your risk factors of vascular disease:  . Stop smoking. If you would like help call [facility] at 1-800-QUIT-NOW 515-842-0978) or [place] at 701-717-4127. . Manage your cholesterol . Maintain a desired weight . Control your diabetes . Keep your blood pressure down .  If you have any questions, please call the office at [phone]

## 2020-06-19 NOTE — Progress Notes (Signed)
Physical Therapy Treatment Patient Details Name: Raymond [name] MRN: [id number] DOB: 04/18/1956 Today's Date: 06/19/2020    History of Present Illness Pt is 64 yo male with PMH including L femoral below -knee popliteal bypass, CAD, COPD, HTN, PAD.  Pt presented with R LE claudication. Pt s/p R common and external artery stenting 06/15/20 with development of scrotal hematoma on L with failure of minx device and had to return to surgery later that day for repair and evacuation of hematoma. Also, complicated by aflutter with RVR.    PT Comments    Pt is seen for gait and reluctant to let PT assist him, stating he would have to do this alone at home.  Pt was able to get 21' but will benefit from SNF due to his assistance to stand, to support bedside balance and his low tolerance for gait.  Follow acute therapy plan, get pt to be safer standing and controlling balance esp to turn around.   Follow Up Recommendations  SNF     Equipment Recommendations  Rolling walker with 5" wheels;3in1 (PT)    Recommendations for Other Services       Precautions / Restrictions Precautions Precautions: Fall Precaution Comments: Pain - premedicate Restrictions Weight Bearing Restrictions: No    Mobility  Bed Mobility Overal bed mobility: Needs Assistance Bed Mobility: Supine to Sit;Sit to Supine     Supine to sit: Supervision Sit to supine: Supervision      Transfers Overall transfer level: Needs assistance Equipment used: Rolling walker (2 wheeled);1 person hand held assist Transfers: Sit to/from Stand Sit to Stand: Min assist         General transfer comment: min assist to stand up from the bed at lower height, pt reluctant to accept help  Ambulation/Gait Ambulation/Gait assistance: Min guard Gait Distance (Feet): 35 Feet Assistive device: Rolling walker (2 wheeled);1 person hand held assist Gait Pattern/deviations: Step-through pattern;Wide base of support Gait velocity:  reduced Gait velocity interpretation: <1.31 ft/sec, indicative of household ambulator General Gait Details: reduced stride but mainly over scrotal pain in spite of sling   Stairs             Wheelchair Mobility    Modified Rankin (Stroke Patients Only)       Balance Overall balance assessment: Needs assistance Sitting-balance support: Bilateral upper extremity supported;Feet supported Sitting balance-Leahy Scale: Fair     Standing balance support: Bilateral upper extremity supported;During functional activity Standing balance-Leahy Scale: Poor                              Cognition Arousal/Alertness: Awake/alert Behavior During Therapy: Flat affect Overall Cognitive Status: Within Functional Limits for tasks assessed                                        Exercises      General Comments        Pertinent Vitals/Pain Pain Assessment: Faces Faces Pain Scale: Hurts even more Pain Location: groin/scrotum Pain Descriptors / Indicators: Guarding;Grimacing Pain Intervention(s): Repositioned;Monitored during session    Home Living                      Prior Function            PT Goals (current goals can now be found in the care plan section) Acute  Rehab PT Goals Patient Stated Goal: decrease pain Progress towards PT goals: Progressing toward goals    Frequency    Min 3X/week      PT Plan Current plan remains appropriate    Co-evaluation              AM-PAC PT "6 Clicks" Mobility   Outcome Measure  Help needed turning from your back to your side while in a flat bed without using bedrails?: A Little Help needed moving from lying on your back to sitting on the side of a flat bed without using bedrails?: A Little Help needed moving to and from a bed to a chair (including a wheelchair)?: A Little Help needed standing up from a chair using your arms (e.g., wheelchair or bedside chair)?: A Little Help needed  to walk in hospital room?: A Little Help needed climbing 3-5 steps with a railing? : A Lot 6 Click Score: 17    End of Session Equipment Utilized During Treatment: Gait belt Activity Tolerance: Patient limited by pain Patient left: in bed;with call bell/phone within reach;with bed alarm set Nurse Communication: Mobility status;Patient requests pain meds PT Visit Diagnosis: Pain;Other abnormalities of gait and mobility (R26.89);Muscle weakness (generalized) (M62.81) Pain - Right/Left: Left Pain - part of body:  (perineum)     Time: 0383-3383 PT Time Calculation (min) (ACUTE ONLY): 29 min  Charges:  $Gait Training: 8-22 mins $Therapeutic Activity: 8-22 mins                   Ramond Dial 06/19/2020, 5:56 PM  Mee Hives, PT MS Acute Rehab Dept. Number: [place] and [place]

## 2020-06-19 NOTE — Progress Notes (Addendum)
Progress Note  Patient Name: Raymond [name] Date of Encounter: 06/19/2020  [facility] Cardiologist: Jenkins Rouge, MD   Subjective   Having trouble getting up to walk 2/2 pain.   Inpatient Medications    Scheduled Meds: . aspirin EC  81 mg Oral Daily  . calcitRIOL  1.5 mcg Oral Q T,Th,Sa-HD  . clopidogrel  75 mg Oral Daily  . darbepoetin (ARANESP) injection - DIALYSIS  60 mcg Intravenous Q Tue-HD  . docusate sodium  100 mg Oral Daily  . heparin  5,000 Units Subcutaneous Q8H  . labetalol  200 mg Oral BID  . pantoprazole  40 mg Oral Daily  . rosuvastatin  10 mg Oral Daily  . sevelamer carbonate  3,200 mg Oral TID WC  . sodium chloride flush  3 mL Intravenous Q12H   Continuous Infusions: . sodium chloride    . sodium chloride 10 mL/hr at 06/19/20 0600  . amiodarone 30 mg/hr (06/19/20 0600)   PRN Meds: sodium chloride, acetaminophen, albuterol, bisacodyl, bismuth subsalicylate, hydrALAZINE, HYDROmorphone (DILAUDID) injection, labetalol, metoprolol tartrate, ondansetron (ZOFRAN) IV, oxyCODONE, QUEtiapine, senna-docusate, sodium chloride flush   Vital Signs    Vitals:   06/18/20 2209 06/18/20 2237 06/19/20 0037 06/19/20 0537  BP: (!) 111/92 120/78 102/73 105/70  Pulse: 85  94 92  Resp:    (!) 22  Temp:    99.7 F (37.6 C)  TempSrc:    Oral  SpO2:    100%  Weight:    74 kg  Height:        Intake/Output Summary (Last 24 hours) at 06/19/2020 0801 Last data filed at 06/19/2020 0600 Gross per 24 hour  Intake 571.45 ml  Output 1500 ml  Net -928.55 ml   Last 3 Weights 06/19/2020 06/18/2020 06/18/2020  Weight (lbs) 163 lb 2.3 oz 161 lb 2.5 oz 164 lb 7.4 oz  Weight (kg) 74 kg 73.1 kg 74.6 kg      Telemetry    Atrial Flutter rates 90 - Personally Reviewed  ECG    No new tracing this morning.   Physical Exam   GEN: No acute distress.   Neck: No JVD Cardiac: Irreg, no murmurs, rubs, or gallops.  Respiratory: Clear to auscultation bilaterally. GI: Soft,  nontender, non-distended  MS: No edema; No deformity. Neuro:  Nonfocal  Psych: Normal affect   Labs    High Sensitivity Troponin:  No results for input(s): TROPONINIHS in the last 720 hours.    Chemistry Recent Labs  Lab 06/16/20 0338 06/17/20 0359 06/17/20 1925  NA 135 137 136  K 6.8* 3.9 4.4  CL 95* 93* 92*  CO2 26 29 32  GLUCOSE 177* 105* 96  BUN 75* 24* 31*  CREATININE 16.86* 9.22* 9.86*  CALCIUM 7.9* 7.9* 8.6*  ALBUMIN  --  2.8* 2.9*  GFRNONAA 3* 6* 5*  ANIONGAP 14 15 12      Hematology Recent Labs  Lab 06/16/20 1040 06/16/20 1834 06/17/20 1925 06/19/20 0038  WBC 3.1*  --  3.3* 3.6*  RBC 2.45*  --  2.96* 2.85*  HGB 8.3* 9.5* 9.5* 9.3*  HCT 25.1* 28.5* 30.7* 29.6*  MCV 102.4*  --  103.7* 103.9*  MCH 33.9  --  32.1 32.6  MCHC 33.1  --  30.9 31.4  RDW 19.4*  --  21.2* 20.2*  PLT 42*  --  87* 100*    BNPNo results for input(s): BNP, PROBNP in the last 168 hours.   DDimer No results for input(s): DDIMER  in the last 168 hours.   Radiology    No results found.  Cardiac Studies   N/a   Patient Profile     64 y.o. male with a history of mild CAD, anemia, COPD, ESRD on HD, Hepatitis C, HTN, PAD, thyroid disease, paroxysmal atrial fibrillation, tobacco abusewhowas seenfor the evaluation of atrial fibrillationat the request ofDr. Donzetta Matters.  Assessment & Plan    1. Atrial Flutter with TGG:YIRSW are stable. Mostly in the 90s. Blood pressures are stable. Unclear how long he has been in this rhythm.  -- transition to amiodarone 400mg  BID today. Cost check on DOAC looks like 0$ copay per PharmD. Would start Eliquis 5mg  BID if ok with VVS. Consider stopping ASA with the need for DOAC. -- This patients CHA2DS2-VASc Score of at least 3 -- will arrange for outpatient follow up, can consider DCCV after 4 weeks of [place] if remains in aflutter  2. S/p right common/external leg artery stenting: developed significant scrotal hematoma and taken back to the OR with  exploration and primary repair. Hgb 9.5 post transfusion. Further management per primary -- current on ASA/plavix. Consider stopping ASA with need for DOAC  3. ESRD on NI:[id number] following. Had HD yesterday and tolerated well per pt.  4. HLD: on statin therapy  For questions or updates, please contact [place] Please consult [email or web address] for contact info under        Signed, Reino Bellis, NP  06/19/2020, 8:01 AM    I have personally seen and examined this patient. I agree with the assessment and plan as outlined above.  Change to po amiodarone today. Start Eliquis if ok with VVS service.  He will need close follow up in our office with Dr. Johnsie Cancel for adjustment of amiodarone dosing.   Lauree Chandler 06/19/2020 8:44 AM

## 2020-06-19 NOTE — TOC Benefit Eligibility Note (Signed)
Transition of Care [facility]) Benefit Eligibility Note    Patient Details  Name: BERTRAN ZEIMET MRN: [id number] Date of Birth: [birth year]   Medication/Dose: Alveda Reasons  15 MG BID    XARELTO  20 MG DAILY   and   ELIQUIS 5 MG   ELIQUIS 2.5 MG BID  Covered?: Yes  Tier: 3 Drug  Prescription Coverage [facility]erred [facility]acy: [facility]  and  [facility]  Spoke with Person/Company/Phone Number:: [place]   @    [employer]  LK #   [phone]  Co-Pay: Johnsie Kindred  FOR EACH  PRESCRIPTION  Prior Approval: No  Deductible: Met  Additional Notes: ELIQUIS  10 MG BID : Crecencio Mc Phone Number: 06/19/2020, 10:04 AM

## 2020-06-19 NOTE — TOC Initial Note (Addendum)
Transition of Care [facility]) - Initial/Assessment Note    Patient Details  Name: Raymond [name] MRN: [id number] Date of Birth: [birth year]  Transition of Care [facility]) CM/SW Contact:    [address], [place] Phone Number: 06/19/2020, 5:23 PM  Clinical Narrative:                 CSW received consult for possible SNF placement at time of discharge. CSW spoke with patient regarding PT recommendation of SNF placement at time of discharge. Patient reported that he lives alone but does get occasional help from his sister, Hassan Rowan. Patient agrees to SNF due to patient's current physical needs and fall risk. Patient expressed understanding of PT recommendation and is agreeable to SNF placement at time of discharge. Patient reports preference for a SNF in the [place] area. CSW discussed insurance authorization process and provided Medicare SNF ratings list. Patient has received the COVID vaccines. Patient expressed being hopeful for rehab and to feel better soon. Patient agreed to having his sister Hassan Rowan contacted for medical information. Pt is currently receiving HD services at the facility off [address] (couldn't recall the name) on T,TH, and Saturday. Insurance Josem Kaufmann was started, reference number [id number], SNF choice will need be updated when pt decides where to go.No further questions reported at this time. CSW to continue to follow and assist with discharge planning needs.        Patient Goals and CMS Choice        Expected Discharge Plan and Services                                                Prior Living Arrangements/Services                       Activities of Daily Living Home Assistive Devices/Equipment: None ADL Screening (condition at time of admission) Patient's cognitive ability adequate to safely complete daily activities?: Yes Is the patient deaf or have difficulty hearing?: No Does the patient have difficulty seeing, even when wearing glasses/contacts?:  No Does the patient have difficulty concentrating, remembering, or making decisions?: No Patient able to express need for assistance with ADLs?: Yes Does the patient have difficulty dressing or bathing?: No Independently performs ADLs?: Yes (appropriate for developmental age) Does the patient have difficulty walking or climbing stairs?: Yes Weakness of Legs: Both Weakness of Arms/Hands: None  Permission Sought/Granted                  Emotional Assessment              Admission diagnosis:  PAD (peripheral artery disease) ([place]) [I73.9] Patient Active Problem List   Diagnosis Date Noted  . History of arteriovenous malformation (AVM) 01/14/2020  . UGIB (upper gastrointestinal bleed) 01/14/2020  . Acute blood loss anemia 01/14/2020  . Elevated troponin 01/14/2020  . Gastroenteritis 12/24/2018  . Substance use disorder 12/24/2018  . Noncompliance of patient with renal dialysis ([place]) 09/12/2018  . Benign neoplasm of colon   . Melena   . Anemia due to chronic kidney disease, on chronic dialysis ([place])   . Acute on chronic anemia   . H/O medication noncompliance   . Polysubstance (excluding opioids) dependence ([place])   . Atrial flutter with rapid ventricular response ([place]) 08/10/2018  . PAF (paroxysmal atrial fibrillation) ([place]) 08/01/2018  . Acute respiratory  failure with hypoxia ([place]) 07/13/2018  . Non-compliance with renal dialysis ([place]) 07/13/2018  . PAD (peripheral artery disease) ([place]) 04/11/2018  . Sepsis ([place]) 03/31/2018  . Preop cardiovascular exam 03/29/2018  . Volume overload 08/07/2017  . Acute pulmonary edema (HCC)   . Dyspnea 06/20/2017  . Acute bronchitis   . COPD (chronic obstructive pulmonary disease) ([place])   . End-stage renal disease on hemodialysis ([place])   . Hypoxia 12/05/2016  . Hyperkalemia 12/19/2015  . Hypoglycemia 12/19/2015  . Polysubstance abuse ([place]) 12/19/2015  . Homeless 12/19/2015  . Anemia associated with stage 5 chronic renal failure  ([place]) 12/19/2015  . Atypical chest pain 12/19/2013  . Atherosclerosis of native arteries of extremity with intermittent claudication ([place]) 12/04/2012  . ESRD (end stage renal disease) ([place]) 12/04/2012  . HEPATITIS C 09/07/2006  . HYPERCHOLESTEROLEMIA 09/07/2006  . HYPERTENSION, BENIGN SYSTEMIC 09/07/2006   PCP:  [name], FNP Pharmacy:   [facility] [place], [address] [place] [facility] [address] Phone: [phone] Fax: [phone]     Social Determinants of Health (SDOH) Interventions    Readmission Risk Interventions No flowsheet data found.

## 2020-06-19 NOTE — Evaluation (Signed)
Occupational Therapy Evaluation Patient Details Name: Raymond [name] MRN: [id number] DOB: May 03, 1956 Today's Date: 06/19/2020    History of Present Illness Pt is 64 yo male with PMH including L femoral below -knee popliteal bypass, CAD, COPD, HTN, PAD.  Pt presented with R LE claudication. Pt s/p R common and external artery stenting 06/15/20 with development of scrotal hematoma on L with failure of minx device and had to return to surgery later that day for repair and evacuation of hematoma. Also, complicated by aflutter with RVR.   Clinical Impression   PTA, pt was living alone and was independent with ADLs and IADLs. Pt currently requiring Min-Mod A for LB ADLs and Min Guard A for functional transfers with RW. Pt limited by pain due do scrotal edema. Fabricating scrotal sling for edema management and to reduce pain. Educating pt and RN on sling management and wear schedule. Pt would benefit from further acute OT to facilitate safe dc. Recommend dc to SNF for further OT to optimize safety, independence with ADLs, and return to PLOF. However, pending pt's pain and progress, he may be able to dc to home.     Follow Up Recommendations  SNF;Supervision/Assistance - 24 hour (Pending progress and pain management, pt may be able to dc to home with HHOT)    Equipment Recommendations  Other (comment) (RW)    Recommendations for Other Services PT consult     Precautions / Restrictions Precautions Precautions: Fall Precaution Comments: Pain - premedicate      Mobility Bed Mobility Overal bed mobility: Needs Assistance Bed Mobility: Supine to Sit;Sit to Supine     Supine to sit: Min guard;HOB elevated Sit to supine: Min guard   General bed mobility comments: Min Guard A for safety. increased effort and time    Transfers Overall transfer level: Needs assistance Equipment used: Rolling walker (2 wheeled) Transfers: Sit to/from Stand Sit to Stand: Min guard         General transfer  comment: Min Guard A for safety. use of RW for stability and to reduce pain. sit<>stand x3    Balance Overall balance assessment: Needs assistance Sitting-balance support: Bilateral upper extremity supported Sitting balance-Leahy Scale: Fair     Standing balance support: Bilateral upper extremity supported Standing balance-Leahy Scale: Poor                             ADL either performed or assessed with clinical judgement   ADL Overall ADL's : Needs assistance/impaired Eating/Feeding: Set up;Bed level   Grooming: Set up;Bed level;Sitting   Upper Body Bathing: Set up;Sitting   Lower Body Bathing: Minimal assistance;Sit to/from stand   Upper Body Dressing : Set up;Sitting   Lower Body Dressing: Moderate assistance;Sit to/from stand   Toilet Transfer: Min guard;Transfer board;RW (side steps towards [facility])           Functional mobility during ADLs: Min guard;Rolling walker General ADL Comments: Pt presenting with decreased activity tolerance due to pain at scrotum and fatigue     Vision         Perception     Praxis      Pertinent Vitals/Pain Pain Assessment: Faces Faces Pain Scale: Hurts whole lot Pain Location: groin/scrotum Pain Descriptors / Indicators: Sharp;Pressure;Tightness Pain Intervention(s): Monitored during session;Limited activity within patient's tolerance;Repositioned     Hand Dominance Right   Extremity/Trunk Assessment Upper Extremity Assessment Upper Extremity Assessment: Overall WFL for tasks assessed   Lower Extremity  Assessment Lower Extremity Assessment: Defer to PT evaluation   Cervical / Trunk Assessment Cervical / Trunk Assessment: Other exceptions Cervical / Trunk Exceptions: scrotal edema and difficulty standing upright due to pain   Communication Communication Communication: No difficulties   Cognition Arousal/Alertness: Awake/alert Behavior During Therapy: Flat affect Overall Cognitive Status: Within  Functional Limits for tasks assessed                                     General Comments  VSS throughout.    Exercises Exercises: Other exercises Other Exercises Other Exercises: Fabricating scrotal sling for edema management and reduce pain during mobility. Notified RN and educated on sling management and wear schedule   Shoulder Instructions      Home Living Family/patient expects to be discharged to:: Private residence Living Arrangements: Alone Available Help at Discharge: Family;Available PRN/intermittently Type of Home: Apartment Home Access: Elevator     Home Layout: One level     Bathroom Shower/Tub: Chief Strategy Officer: Standard     Home Equipment: Grab bars - toilet;Grab bars - tub/shower          Prior Functioning/Environment Level of Independence: Independent        Comments: Pt independent with ADLs, IADLs, and community ambulation. Does not work or drive and has someone drive him to store        OT Problem List: Decreased strength;Decreased range of motion;Decreased activity tolerance;Impaired balance (sitting and/or standing);Decreased knowledge of precautions;Decreased knowledge of use of DME or AE;Pain      OT Treatment/Interventions: Self-care/ADL training;Therapeutic exercise;Energy conservation;DME and/or AE instruction;Therapeutic activities;Patient/family education    OT Goals(Current goals can be found in the care plan section) Acute Rehab OT Goals Patient Stated Goal: decrease pain OT Goal Formulation: With patient Time For Goal Achievement: 07/03/20 Potential to Achieve Goals: Good  OT Frequency: Min 2X/week   Barriers to D/C:            Co-evaluation              AM-PAC OT "6 Clicks" Daily Activity     Outcome Measure Help from another person eating meals?: None Help from another person taking care of personal grooming?: A Little Help from another person toileting, which includes using  toliet, bedpan, or urinal?: A Little Help from another person bathing (including washing, rinsing, drying)?: A Little Help from another person to put on and taking off regular upper body clothing?: A Little Help from another person to put on and taking off regular lower body clothing?: A Lot 6 Click Score: 18   End of Session Equipment Utilized During Treatment: Rolling walker;Other (comment) (scrotal sling) Nurse Communication: Mobility status;Other (comment) (sling management)  Activity Tolerance: Patient tolerated treatment well Patient left: in bed;with bed alarm set;with call bell/phone within reach  OT Visit Diagnosis: Other abnormalities of gait and mobility (R26.89);Unsteadiness on feet (R26.81);Muscle weakness (generalized) (M62.81);Pain Pain - part of body:  (scrotum)                Time: 8756-4332 OT Time Calculation (min): 33 min Charges:  OT General Charges $OT Visit: 1 Visit OT Evaluation $OT Eval Moderate Complexity: 1 Mod OT Treatments $Self Care/Home Management : 8-22 mins  Antoine Fiallos MSOT, OTR/L Acute Rehab Pager: [phone] Office: [phone]  Theodoro Grist Casi Westerfeld 06/19/2020, 12:26 PM

## 2020-06-20 ENCOUNTER — Inpatient Hospital Stay (HOSPITAL_COMMUNITY): Payer: Medicare Other

## 2020-06-20 LAB — CBC
HCT: 28.6 % — ABNORMAL LOW (ref 39.0–52.0)
Hemoglobin: 8.7 g/dL — ABNORMAL LOW (ref 13.0–17.0)
MCH: 32 pg (ref 26.0–34.0)
MCHC: 30.4 g/dL (ref 30.0–36.0)
MCV: 105.1 fL — ABNORMAL HIGH (ref 80.0–100.0)
Platelets: 113 10*3/uL — ABNORMAL LOW (ref 150–400)
RBC: 2.72 MIL/uL — ABNORMAL LOW (ref 4.22–5.81)
RDW: 19.9 % — ABNORMAL HIGH (ref 11.5–15.5)
WBC: 5.5 10*3/uL (ref 4.0–10.5)
nRBC: 0 % (ref 0.0–0.2)

## 2020-06-20 LAB — RENAL FUNCTION PANEL
Albumin: 2.6 g/dL — ABNORMAL LOW (ref 3.5–5.0)
Anion gap: 12 (ref 5–15)
BUN: 31 mg/dL — ABNORMAL HIGH (ref 8–23)
CO2: 26 mmol/L (ref 22–32)
Calcium: 9.3 mg/dL (ref 8.9–10.3)
Chloride: 94 mmol/L — ABNORMAL LOW (ref 98–111)
Creatinine, Ser: 10.29 mg/dL — ABNORMAL HIGH (ref 0.61–1.24)
GFR, Estimated: 5 mL/min — ABNORMAL LOW (ref >60)
Glucose, Bld: 89 mg/dL (ref 70–99)
Phosphorus: 3.5 mg/dL (ref 2.5–4.6)
Potassium: 5.1 mmol/L (ref 3.5–5.1)
Sodium: 132 mmol/L — ABNORMAL LOW (ref 135–145)

## 2020-06-20 IMAGING — DX DG CHEST 1V
1 series · 1 of 1 positions shown · non-contrast
Comparison: [DATE]

CLINICAL DATA: Cough and fever

EXAM:
CHEST  1 VIEW

[chest ap]
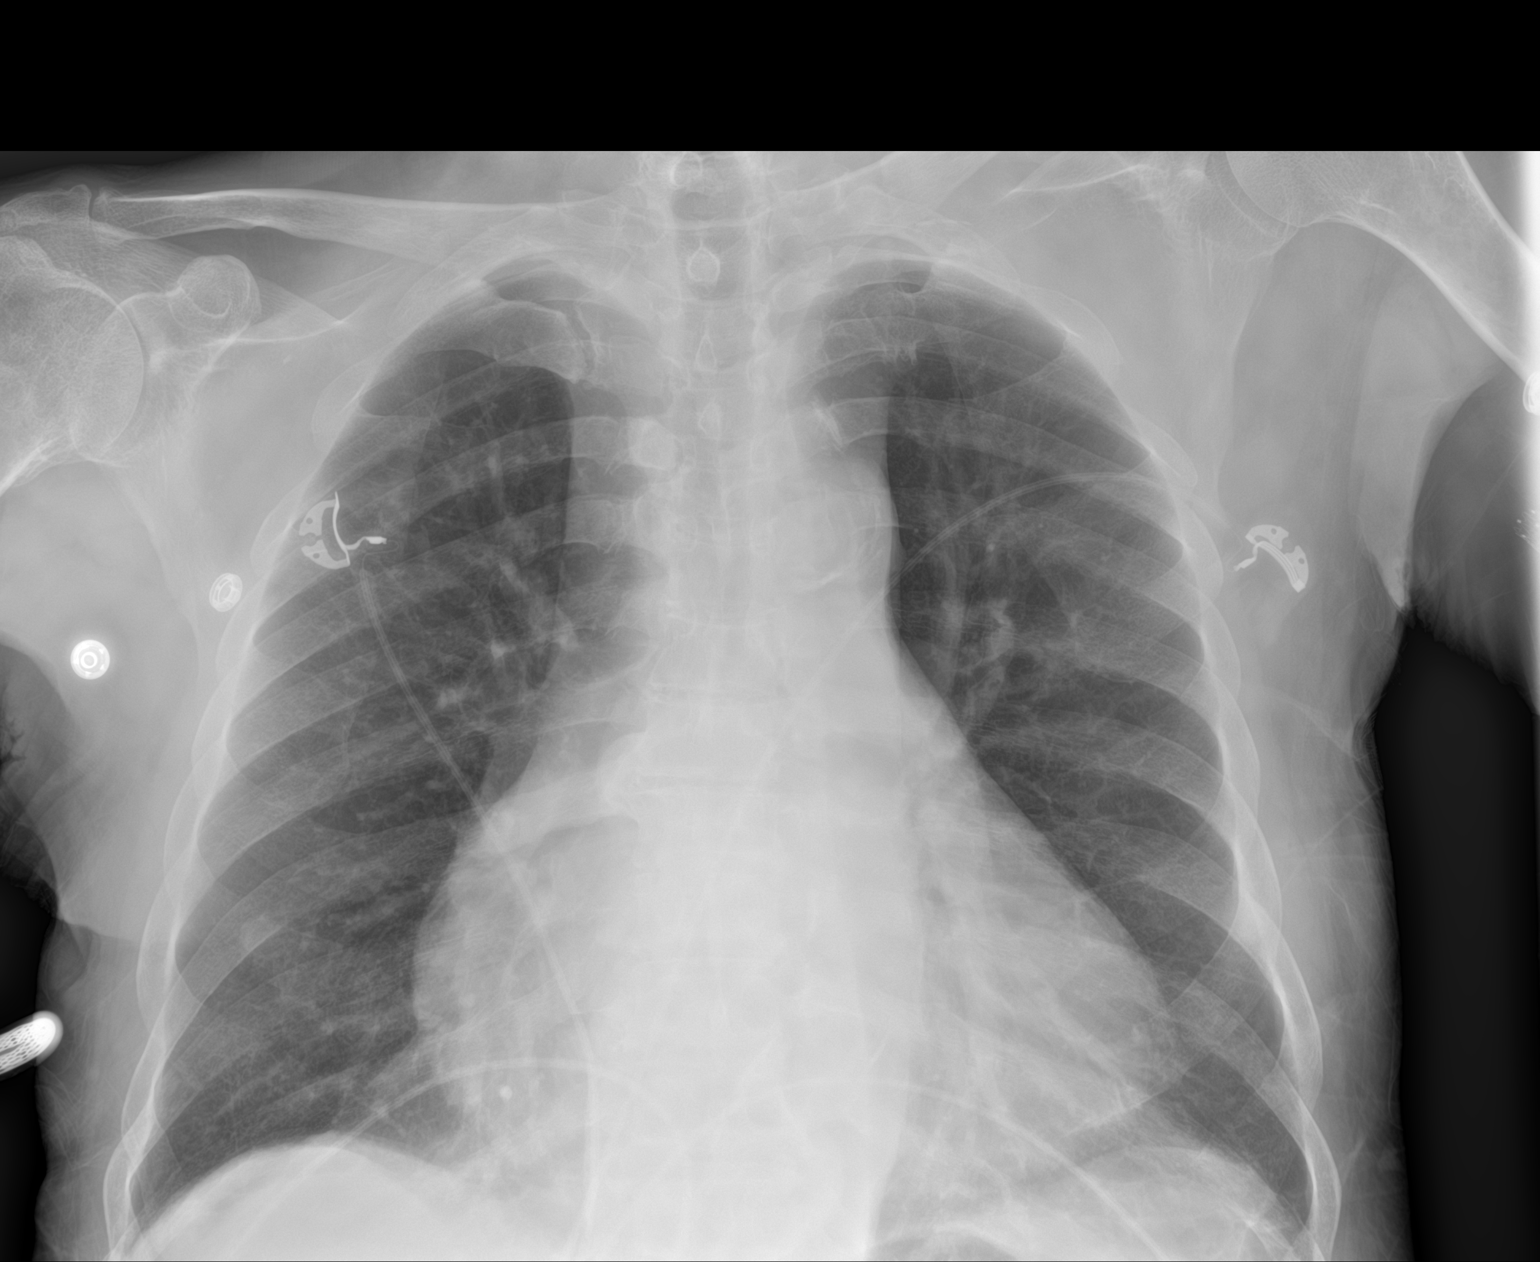

[1 of 1 positions shown; findings below may reference images not displayed]

FINDINGS: The heart size is enlarged but stable from prior study. There is
minimal vascular congestion without overt pulmonary edema. Aortic
calcifications are noted. There is no pneumothorax. No large pleural
effusion. No focal infiltrate. No acute osseous abnormality.
IMPRESSION: 1. Cardiomegaly with minimal vascular congestion without overt
pulmonary edema.
2. No focal infiltrate.

## 2020-06-20 MED ORDER — ALTEPLASE 2 MG IJ SOLR: 2.0000 mg | Freq: Once | INTRAMUSCULAR | Status: DC | PRN

## 2020-06-20 MED ORDER — LIDOCAINE-PRILOCAINE 2.5-2.5 % EX CREA: 1.0000 "application " | TOPICAL_CREAM | CUTANEOUS | Status: DC | PRN

## 2020-06-20 MED ORDER — CALCITRIOL 0.5 MCG PO CAPS
ORAL_CAPSULE | ORAL | Status: AC
  Filled 2020-06-20: qty 3

## 2020-06-20 MED ORDER — SODIUM CHLORIDE 0.9 % IV SOLN: 100.0000 mL | INTRAVENOUS | Status: DC | PRN

## 2020-06-20 MED ORDER — MIDODRINE HCL 5 MG PO TABS
ORAL_TABLET | ORAL | Status: AC
  Filled 2020-06-20: qty 2

## 2020-06-20 MED ORDER — PENTAFLUOROPROP-TETRAFLUOROETH EX AERO: 1.0000 "application " | INHALATION_SPRAY | CUTANEOUS | Status: DC | PRN

## 2020-06-20 MED ORDER — LIDOCAINE HCL (PF) 1 % IJ SOLN: 5.0000 mL | INTRAMUSCULAR | Status: DC | PRN

## 2020-06-20 MED ORDER — MIDODRINE HCL 5 MG PO TABS
10.0000 mg | ORAL_TABLET | Freq: Three times a day (TID) | ORAL | Status: DC
  Administered 2020-06-20 – 2020-06-22 (×8): 10 mg via ORAL
  Filled 2020-06-20 (×7): qty 2

## 2020-06-20 MED ORDER — HEPARIN SODIUM (PORCINE) 1000 UNIT/ML DIALYSIS: 1000.0000 [IU] | INTRAMUSCULAR | Status: DC | PRN

## 2020-06-20 NOTE — Progress Notes (Signed)
     Subjective  - He is having difficulty with ambulation secondary to scrotal swelling.   Objective 114/72 95 98.5 F (36.9 C) (Oral) 18 97%  Intake/Output Summary (Last 24 hours) at 06/20/2020 0932 Last data filed at 06/20/2020 0641 Gross per 24 hour  Intake 243 ml  Output --  Net 243 ml    Feet warm with intact sensation and motor, well perfused Left groin without recurrent hematoma, scrotal area with decreased edema.  Laboratory Lab Results: Recent Labs    06/19/20 0038 06/20/20 0832  WBC 3.6* 5.5  HGB 9.3* 8.7*  HCT 29.6* 28.6*  PLT 100* 113*   BMET Recent Labs    06/17/20 1925 06/20/20 0832  NA 136 132*  K 4.4 5.1  CL 92* 94*  CO2 32 26  GLUCOSE 96 89  BUN 31* 31*  CREATININE 9.86* 10.29*  CALCIUM 8.6* 9.3    COAG Lab Results  Component Value Date   INR 1.23 08/19/2018   INR 1.27 08/18/2018   INR 1.55 08/13/2018   No results found for: PTT  Assessment/Planning: POD # 4 64 y.o. male is s/p R CIA and EIA stenting with subsequent L CFA repair 3 Days Post-Op   Well perused B LE Pending possible SNF placement and DOAC affordability.   Encouraged ambulation.  Yevonne Aline. Stanford Breed, MD [facility] Phone Number: [phone] 06/20/2020 9:32 AM

## 2020-06-20 NOTE — Procedures (Signed)
Patient was seen on dialysis and the procedure was supervised.  BFR 400  Via left AVF BP is 98/75. Asymptomatic. Lower dialysate temp tp 36 and lower UF goal for intra-dialytic hypotension.     Patient appears to be tolerating treatment well.  Raymond [name] 06/20/2020

## 2020-06-20 NOTE — Progress Notes (Signed)
Pt had temp 101 around 1630.  Administered tylenol. Rechecked temp 1.5 hrs later and it was still 101.1. Pt had fever last night per night shift.  Dr. Stanford Breed made aware.  Received order for blood cx and cxr.  Idolina Primer, RN

## 2020-06-21 NOTE — Progress Notes (Signed)
°  ° ° °  Subjective  - Ambulating well.  Objective 98/70 82 98.3 F (36.8 C) (Oral) 18 97%  Intake/Output Summary (Last 24 hours) at 06/21/2020 1116 Last data filed at 06/21/2020 0650 Gross per 24 hour  Intake 163 ml  Output 1500 ml  Net -1337 ml    Feet warm with intact sensation and motor, well perfused Left groin without recurrent hematoma, scrotal area with decreased edema.  Laboratory Lab Results: Recent Labs    06/19/20 0038 06/20/20 0832  WBC 3.6* 5.5  HGB 9.3* 8.7*  HCT 29.6* 28.6*  PLT 100* 113*   BMET Recent Labs    06/17/20 1925 06/20/20 0832  NA 136 132*  K 4.4 5.1  CL 92* 94*  CO2 32 26  GLUCOSE 96 89  BUN 31* 31*  CREATININE 9.86* 10.29*  CALCIUM 8.6* 9.3    COAG Lab Results  Component Value Date   INR 1.23 08/19/2018   INR 1.27 08/18/2018   INR 1.55 08/13/2018   No results found for: PTT  Assessment/Planning: POD # 4 64 y.o. male is s/p R CIA and EIA stenting with subsequent L CFA repair 3 Days Post-Op   Well perused B LE Pending possible SNF placement and DOAC affordability.   Encouraged ambulation.  Yevonne Aline. Stanford Breed, MD [facility] Phone Number: [phone] 06/21/2020 11:16 AM

## 2020-06-21 NOTE — Progress Notes (Addendum)
Occupational Therapy Treatment Patient Details Name: Raymond [name] MRN: [id number] DOB: 1955/09/23 Today's Date: 06/21/2020    History of present illness Pt is 64 yo male with PMH including L femoral below -knee popliteal bypass, CAD, COPD, HTN, PAD.  Pt presented with R LE claudication. Pt s/p R common and external artery stenting 06/15/20 with development of scrotal hematoma on L with failure of minx device and had to return to surgery later that day for repair and evacuation of hematoma. Also, complicated by aflutter with RVR.   OT comments  Pt seen for OT follow up session to check scrotal sling. Upon entry, pt states he does not know where sling is because he asked RN staff to take it off. OT located sling and offered to reapply. He states "it did not make much difference either way", despite showing increased comfort and mobility when applied last OT session. OT re educated on benefits from scrotal sling for edema, pain reduction, and increased mobility. He continued to refuse it being placed back on him. OT then educated how to elevate scrotum with pillow case/wash cloths when in the bed. D/c recs remain appropriate, will continue to follow.   Follow Up Recommendations  SNF;Supervision/Assistance - 24 hour    Equipment Recommendations  Other (comment) (RW)    Recommendations for Other Services      Precautions / Restrictions Precautions Precautions: Fall Restrictions Weight Bearing Restrictions: No       Mobility Bed Mobility Overal bed mobility: Needs Assistance Bed Mobility: Supine to Sit;Sit to Supine     Supine to sit: Supervision     General bed mobility comments: sitting EOB for lunch  Transfers                      Balance                                           ADL either performed or assessed with clinical judgement   ADL                                         General ADL Comments: Session focused on  scrotal sling education     Vision       Perception     Praxis      Cognition Arousal/Alertness: Awake/alert Behavior During Therapy: Flat affect Overall Cognitive Status: No family/caregiver present to determine baseline cognitive functioning Area of Impairment: Safety/judgement                         Safety/Judgement: Decreased awareness of deficits     General Comments: pt refusing reapplication of scrotal sling, stating he did not notice a difference despite reporting increased comfort last time it was placed        Exercises     Shoulder Instructions       General Comments      Pertinent Vitals/ Pain       Pain Assessment: Faces Faces Pain Scale: Hurts even more Pain Location: groin/scrotum Pain Descriptors / Indicators: Guarding;Grimacing Pain Intervention(s): Repositioned;Monitored during session  Home Living  Prior Functioning/Environment              Frequency  Min 2X/week        Progress Toward Goals  OT Goals(current goals can now be found in the care plan section)  Progress towards OT goals: Progressing toward goals  Acute Rehab OT Goals Patient Stated Goal: decrease pain OT Goal Formulation: With patient Time For Goal Achievement: 07/03/20 Potential to Achieve Goals: Good  Plan Discharge plan remains appropriate    Co-evaluation                 AM-PAC OT "6 Clicks" Daily Activity     Outcome Measure   Help from another person eating meals?: None Help from another person taking care of personal grooming?: A Little Help from another person toileting, which includes using toliet, bedpan, or urinal?: A Little Help from another person bathing (including washing, rinsing, drying)?: A Little Help from another person to put on and taking off regular upper body clothing?: A Little Help from another person to put on and taking off regular lower body clothing?:  A Lot 6 Click Score: 18    End of Session Equipment Utilized During Treatment: Rolling walker  OT Visit Diagnosis: Other abnormalities of gait and mobility (R26.89);Unsteadiness on feet (R26.81);Muscle weakness (generalized) (M62.81);Pain Pain - part of body:  (scrotum)   Activity Tolerance Patient tolerated treatment well   Patient Left in bed;with bed alarm set;with call bell/phone within reach   Nurse Communication Mobility status        Time: 2330-0762 OT Time Calculation (min): 10 min  Charges: OT Treatments $Self Care/Home Management : 8-22 mins  Zenovia Jarred, MSOT, OTR/L [facility] Office Number: [phone] Pager: [phone]  Zenovia Jarred 06/21/2020, 2:09 PM

## 2020-06-21 NOTE — TOC Progression Note (Signed)
Transition of Care [facility]) - Progression Note    Patient Details  Name: CAS TRACZ MRN: [id number] Date of Birth: 05-18-1956  Transition of Care [facility] CM/SW [place], [place] Phone Number: [phone] 06/21/2020, 3:56 PM  Clinical Narrative:     CSW spoke with patient in regards to the bed offer form [place]. Patient stated that he did not want to [facility] and wanted to see if more offers come back.  TOC team will continue to assist with discharge planning needs.       Expected Discharge Plan and Services                                                 Social Determinants of Health (SDOH) Interventions    Readmission Risk Interventions No flowsheet data found.

## 2020-06-21 NOTE — Progress Notes (Signed)
[facility] NEPHROLOGY PROGRESS NOTE  Assessment/ Plan: Pt is a 64 y.o. yo male with ESRD on HD, anemia who underwent vascular procedure on 12/6, consulted for ESRD management. OP HD orders: [facility], outpatient orders: 4hrs, F180, 450/800, 2K, 2Ca, 137Na, 35Bicarb UF Profile 4, no heparin  #PAD underwent right common and external leg artery stenting on 12/6 by vascular.  Scrotal hematoma, vascular is following.  # ESRD: TTS: Status post dialysis yesterday with 1.5 L UF, tolerated well.  Next HD on 12/14.  # Anemia due to ESRD and ABLA during surgery: Drop in hemoglobin.  Continue ESA.  Transfuse blood as needed.  # Secondary hyperparathyroidism: Continue calcitriol, holding Sensipar because of hypocalcemia.  Monitor Ca, phosphorus level.  # HTN/volume: Blood pressure soft, not on beta-blocker.  On midodrine 3 times daily.  Asymptomatic.  #AMS likely due to sedatives: Mental status improved.  Recommend to avoid or at least minimize sedatives.  #Hyperkalemia: Managed with dialysis.  #Atrial flutter with RVR: Unable to receive beta-blocker because of hypotension.  Started amiodarone by cardiologist.  #Disposition: Plan to discharge to SNF noted.  #Fever: Culture pending, chest x-ray with no infection.  Afebrile today.  Subjective: Seen and examined at bedside.  Denies nausea vomiting chest pain shortness of breath.  Objective Vital signs in last 24 hours: Vitals:   06/20/20 2009 06/20/20 2357 06/21/20 0357 06/21/20 0745  BP: (!) 94/56 100/78 99/65 98/70   Pulse: 78 80 83 82  Resp: 20 20 18 18   Temp: 99.7 F (37.6 C) 99.7 F (37.6 C) 98.2 F (36.8 C) 98.3 F (36.8 C)  TempSrc: Oral Oral Axillary Oral  SpO2: 97% 100% 97% 97%  Weight:   72.9 kg   Height:       Weight change: 1.3 kg  Intake/Output Summary (Last 24 hours) at 06/21/2020 1001 Last data filed at 06/21/2020 0650 Gross per 24 hour  Intake 163 ml  Output 1500 ml  Net -1337 ml       Labs: Basic  Metabolic Panel: Recent Labs  Lab 06/17/20 0359 06/17/20 1925 06/20/20 0832  NA 137 136 132*  K 3.9 4.4 5.1  CL 93* 92* 94*  CO2 29 32 26  GLUCOSE 105* 96 89  BUN 24* 31* 31*  CREATININE 9.22* 9.86* 10.29*  CALCIUM 7.9* 8.6* 9.3  PHOS 4.8* 5.3* 3.5   Liver Function Tests: Recent Labs  Lab 06/17/20 0359 06/17/20 1925 06/20/20 0832  ALBUMIN 2.8* 2.9* 2.6*   No results for input(s): LIPASE, AMYLASE in the last 168 hours. No results for input(s): AMMONIA in the last 168 hours. CBC: Recent Labs  Lab 06/16/20 0338 06/16/20 1040 06/16/20 1834 06/17/20 1925 06/19/20 0038 06/20/20 0832  WBC 2.7* 3.1*  --  3.3* 3.6* 5.5  HGB 7.4* 8.3*   < > 9.5* 9.3* 8.7*  HCT 23.8* 25.1*   < > 30.7* 29.6* 28.6*  MCV 108.7* 102.4*  --  103.7* 103.9* 105.1*  PLT 90* 42*  --  87* 100* 113*   < > = values in this interval not displayed.   Cardiac Enzymes: No results for input(s): CKTOTAL, CKMB, CKMBINDEX, TROPONINI in the last 168 hours. CBG: Recent Labs  Lab 06/15/20 2034 06/15/20 2340 06/16/20 0419  GLUCAP 169* 182* 187*    Iron Studies: No results for input(s): IRON, TIBC, TRANSFERRIN, FERRITIN in the last 72 hours. Studies/Results: DG Chest 1 View  Result Date: 06/20/2020 CLINICAL DATA:  Cough and fever EXAM: CHEST  1 VIEW COMPARISON:  January 14, 2020 FINDINGS: The heart size is enlarged but stable from prior study. There is minimal vascular congestion without overt pulmonary edema. Aortic calcifications are noted. There is no pneumothorax. No large pleural effusion. No focal infiltrate. No acute osseous abnormality. IMPRESSION: 1. Cardiomegaly with minimal vascular congestion without overt pulmonary edema. 2. No focal infiltrate. Electronically Signed   By: Constance Holster M.D.   On: 06/20/2020 19:14    Medications: Infusions: . sodium chloride    . sodium chloride 10 mL/hr at 06/19/20 0600    Scheduled Medications: . amiodarone  400 mg Oral BID   Followed by  . [START ON  06/23/2020] amiodarone  400 mg Oral Daily   Followed by  . [START ON 06/30/2020] amiodarone  200 mg Oral Daily  . apixaban  5 mg Oral BID  . aspirin EC  81 mg Oral Daily  . calcitRIOL  1.5 mcg Oral Q T,Th,Sa-HD  . Chlorhexidine Gluconate Cloth  6 each Topical Q0600  . clopidogrel  75 mg Oral Daily  . darbepoetin (ARANESP) injection - DIALYSIS  60 mcg Intravenous Q Tue-HD  . docusate sodium  100 mg Oral Daily  . midodrine  10 mg Oral TID WC  . pantoprazole  40 mg Oral Daily  . rosuvastatin  10 mg Oral Daily  . sevelamer carbonate  3,200 mg Oral TID WC  . sodium chloride flush  3 mL Intravenous Q12H    have reviewed scheduled and prn medications.  Physical Exam: General: Alert awake and comfortable.  Able to lie flat. Heart:RRR, s1s2 nl Lungs: Clear b/l, no crackle Abdomen:soft, Non-tender, non-distended Extremities:trace edema Dialysis Access: Left upper extremity AV fistula has good thrill and bruit.  Raymond [name] 06/21/2020,10:01 AM  LOS: 6 days  Pager: [phone]

## 2020-06-22 ENCOUNTER — Other Ambulatory Visit (HOSPITAL_COMMUNITY): Payer: Self-pay | Admitting: Physician Assistant

## 2020-06-22 DIAGNOSIS — I739 Peripheral vascular disease, unspecified: Secondary | ICD-10-CM

## 2020-06-22 MED ORDER — AMIODARONE HCL 200 MG PO TABS: 200.0000 mg | ORAL_TABLET | Freq: Every day | ORAL | 2 refills | Status: DC

## 2020-06-22 MED ORDER — OXYCODONE-ACETAMINOPHEN 5-325 MG PO TABS: 1.0000 | ORAL_TABLET | ORAL | 0 refills | Status: DC | PRN

## 2020-06-22 MED ORDER — ROSUVASTATIN CALCIUM 10 MG PO TABS: 10.0000 mg | ORAL_TABLET | Freq: Every day | ORAL | 11 refills | Status: DC

## 2020-06-22 MED ORDER — APIXABAN 5 MG PO TABS: 5.0000 mg | ORAL_TABLET | Freq: Two times a day (BID) | ORAL | 0 refills | Status: DC

## 2020-06-22 MED ORDER — CLOPIDOGREL BISULFATE 75 MG PO TABS: 75.0000 mg | ORAL_TABLET | Freq: Every day | ORAL | 3 refills | Status: DC

## 2020-06-22 MED FILL — OXYCODONE-APAP 5-325MG: 5-325 | 2 days supply | Qty: 12 | Fill #0

## 2020-06-22 MED FILL — ELIQUIS 5 MG TABLET: 5 | 30 days supply | Qty: 60 | Fill #0

## 2020-06-22 MED FILL — AMIODARONE HCL 200 MG TAB: 200 | 23 days supply | Qty: 30 | Fill #0

## 2020-06-22 MED FILL — CLOPIDOGREL 75 MG TABLET: 75 | 30 days supply | Qty: 30 | Fill #0

## 2020-06-22 MED FILL — ROSUVASTATIN CALCIUM 10 MG: 10 | 30 days supply | Qty: 30 | Fill #0

## 2020-06-22 NOTE — Progress Notes (Addendum)
[facility] NEPHROLOGY PROGRESS NOTE   Physical Exam: General: Alert awake and comfortable Heart:RRR, s1s2 nl Lungs: Clear b/l, no crackle Abdomen:soft, Non-tender, non-distended Extremities:trace edema Dialysis Access: LUE AVF+bruit   OP HD: [facility] TTS  4h  69.5kg  450/800  2/2 bath  P4  Hep none  L AVF   Assessment/ Plan: Pt is a 64 y.o. yo male with ESRD on HD, anemia who underwent vascular procedure on 12/6, consulted for ESRD management.  Problems: 1. PAD - SP RLE surgical revasc on 12/6 per [facility]. Scrotal hematoma, vascular following. 2. ESRD: TTS HD. Next HD tomorrow 3. Anemia due to ESRD and ABLA during surgery: Drop in hemoglobin.  continue ESA.  Transfuse blood as needed. 4. Secondary hyperparathyroidism: Continue calcitriol, holding Sensipar because of hypocalcemia.  Monitor Ca, phosphorus level. 5. HTN/volume: Blood pressure soft, not on beta-blocker.  On midodrine 3 times daily.  Asymptomatic. 6. AMS likely due to sedatives: Mental status improved.  Recommend to avoid/ minimize sedatives. 7. Hyperkalemia: Managed with dialysis. 8. Atrial flutter with RVR: Unable to receive beta-blocker because of hypotension.  Started amiodarone by cardiologist , aslo getting eliquis, ASA may need to be stopped w/ new DOAC per cards.  9. Fever: Culture pending, chest x-ray with no infection.  Afebrile now 10. Disposition: Plan to discharge to SNF noted.  Kelly Splinter, MD 06/22/2020, 3:52 PM .  Subjective: Seen in room, lethargic but awakens and answers questions appropriately. No c/o.   Objective Vital signs in last 24 hours: Vitals:   06/22/20 0921 06/22/20 1057 06/22/20 1146 06/22/20 1242  BP: 91/60 102/70 (!) 81/61 97/73  Pulse: 77     Resp: (!) 22  18 19   Temp: 98 F (36.7 C)   97.7 F (36.5 C)  TempSrc: Oral   Oral  SpO2: 100%   100%  Weight:      Height:       Weight change: -1.6 kg  Intake/Output Summary (Last 24 hours) at 06/22/2020 1548 Last data  filed at 06/22/2020 1340 Gross per 24 hour  Intake 363 ml  Output --  Net 363 ml       Labs: Basic Metabolic Panel: Recent Labs  Lab 06/17/20 0359 06/17/20 1925 06/20/20 0832  NA 137 136 132*  K 3.9 4.4 5.1  CL 93* 92* 94*  CO2 29 32 26  GLUCOSE 105* 96 89  BUN 24* 31* 31*  CREATININE 9.22* 9.86* 10.29*  CALCIUM 7.9* 8.6* 9.3  PHOS 4.8* 5.3* 3.5   Liver Function Tests: Recent Labs  Lab 06/17/20 0359 06/17/20 1925 06/20/20 0832  ALBUMIN 2.8* 2.9* 2.6*   No results for input(s): LIPASE, AMYLASE in the last 168 hours. No results for input(s): AMMONIA in the last 168 hours. CBC: Recent Labs  Lab 06/16/20 0338 06/16/20 1040 06/16/20 1834 06/17/20 1925 06/19/20 0038 06/20/20 0832  WBC 2.7* 3.1*  --  3.3* 3.6* 5.5  HGB 7.4* 8.3*   < > 9.5* 9.3* 8.7*  HCT 23.8* 25.1*   < > 30.7* 29.6* 28.6*  MCV 108.7* 102.4*  --  103.7* 103.9* 105.1*  PLT 90* 42*  --  87* 100* 113*   < > = values in this interval not displayed.   Cardiac Enzymes: No results for input(s): CKTOTAL, CKMB, CKMBINDEX, TROPONINI in the last 168 hours. CBG: Recent Labs  Lab 06/15/20 2034 06/15/20 2340 06/16/20 0419  GLUCAP 169* 182* 187*    Iron Studies: No results for input(s): IRON, TIBC, TRANSFERRIN, FERRITIN in the  last 72 hours. Studies/Results: DG Chest 1 View  Result Date: 06/20/2020 CLINICAL DATA:  Cough and fever EXAM: CHEST  1 VIEW COMPARISON:  January 14, 2020 FINDINGS: The heart size is enlarged but stable from prior study. There is minimal vascular congestion without overt pulmonary edema. Aortic calcifications are noted. There is no pneumothorax. No large pleural effusion. No focal infiltrate. No acute osseous abnormality. IMPRESSION: 1. Cardiomegaly with minimal vascular congestion without overt pulmonary edema. 2. No focal infiltrate. Electronically Signed   By: Constance Holster M.D.   On: 06/20/2020 19:14    Medications: Infusions: . sodium chloride    . sodium chloride 10  mL/hr at 06/19/20 0600    Scheduled Medications: . [START ON 06/23/2020] amiodarone  400 mg Oral Daily   Followed by  . [START ON 06/30/2020] amiodarone  200 mg Oral Daily  . apixaban  5 mg Oral BID  . calcitRIOL  1.5 mcg Oral Q T,Th,Sa-HD  . Chlorhexidine Gluconate Cloth  6 each Topical Q0600  . clopidogrel  75 mg Oral Daily  . darbepoetin (ARANESP) injection - DIALYSIS  60 mcg Intravenous Q Tue-HD  . docusate sodium  100 mg Oral Daily  . midodrine  10 mg Oral TID WC  . pantoprazole  40 mg Oral Daily  . rosuvastatin  10 mg Oral Daily  . sevelamer carbonate  3,200 mg Oral TID WC  . sodium chloride flush  3 mL Intravenous Q12H    have reviewed scheduled and prn medications.

## 2020-06-22 NOTE — TOC Transition Note (Signed)
Transition of Care (TOC) - CM/SW Discharge Note Marvetta Gibbons RN, BSN Transitions of Care Unit 4E- RN Case Manager See Treatment Team for direct phone # Cross coverage for [place]   Patient Details  Name: Raymond [name] MRN: [id number] Date of Birth: 1956/05/17  Transition of Care [facility]) CM/SW Contact:  Dawayne Patricia, RN Phone Number: 06/22/2020, 3:40 PM   Clinical Narrative:    Notified by CSW that pt has refused STSFN placement and wants to return home with [facility]. Orders have been placed for HHPT/OT. This Probation officer spoke with pt at the bedside for transition needs- list provided to pt for [facility] agency choice Per CMS guidelines from medicare.gov website with star ratings (copy placed in shadow chart)- pt states he does not remember agency name that he has used in past- would like to go home and he has name at home- contact info provided for pt to call this writer back- pt voiced he will call back when he gets home and get info or he will f/u with he PCP for [facility] needs. Encouraged pt to call this writer back to set up [facility] services as he would be serviced quicker for [facility] needs post discharge. Pt voiced understanding.  Discussed DME needs RW or 3n1- pt voiced he did not need DME- "I only used it because they wanted me to" he declined DME for discharge. He reports he uses [employer] transport for HD transportation needs.   1530-Spoke with pt along with CSW regarding concerns for pt returning home alone, however pt continued to decline SNF and wants to f/u on [facility] once he returns home, Declining DME needs. Pt continued to express he had brothers and neighbors that would assist him at home.  Pt voiced he was working on getting transportation home- pt to let staff know if he will need assistance with transport home today.   TOC pharmacy to fill 30 day free Eliquis for discharge-    Final next level of care: Home w Home Health Services Barriers to Discharge: No Barriers Identified   Patient Goals and CMS  Choice Patient states their goals for this hospitalization and ongoing recovery are:: return home CMS Medicare.gov Compare Post Acute Care list provided to:: Patient Choice offered to / list presented to : Patient  Discharge Placement               Home with [facility] if pt returns call for referral choice         Discharge Plan and Services In-house Referral: Clinical Social Work Discharge Planning Services: CM Consult,Medication Assistance Post Acute Care Choice: Home Health          DME Arranged: N/A DME Agency: NA       HH Arranged: PT,OT,Refused SNF HH Agency:  (pt to call back with agency choice)        Social Determinants of Health (SDOH) Interventions     Readmission Risk Interventions Readmission Risk Prevention Plan 06/22/2020  Transportation Screening Complete  Medication Review Press photographer) Complete  PCP or Specialist appointment within 3-5 days of discharge Complete  HRI or [place] Complete  SW Recovery Care/Counseling Consult Complete  [place] Patient Refused  Some recent data might be hidden

## 2020-06-22 NOTE — TOC Benefit Eligibility Note (Signed)
Transition of Care [facility]) Benefit Eligibility Note    Patient Details  Name: Raymond [name] MRN: [id number] Date of Birth: 11-10-1955   Medication/Dose: Eliquis 58m bid  Covered?: Yes  Tier: 3 Drug  Prescription Coverage Preferred Pharmacy: [facility]  Spoke with Person/Company/Phone Number:: [employer]  Co-Pay: 25% of total cost of medication (estimated cost would be $532.26) for 30 day retail  Prior Approval: No  Deductible: Met  Additional Notes: NA    MMellon FinancialPhone Number: 06/22/2020, 12:32 PM

## 2020-06-22 NOTE — Progress Notes (Signed)
Progress Note  Patient Name: Raymond [name] Date of Encounter: 06/22/2020  [facility] Cardiologist: Jenkins Rouge, MD   Subjective   No complaints this morning. Very minimal pain with ambulation.   Inpatient Medications    Scheduled Meds: . amiodarone  400 mg Oral BID   Followed by  . [START ON 06/23/2020] amiodarone  400 mg Oral Daily   Followed by  . [START ON 06/30/2020] amiodarone  200 mg Oral Daily  . apixaban  5 mg Oral BID  . aspirin EC  81 mg Oral Daily  . calcitRIOL  1.5 mcg Oral Q T,Th,Sa-HD  . Chlorhexidine Gluconate Cloth  6 each Topical Q0600  . clopidogrel  75 mg Oral Daily  . darbepoetin (ARANESP) injection - DIALYSIS  60 mcg Intravenous Q Tue-HD  . docusate sodium  100 mg Oral Daily  . midodrine  10 mg Oral TID WC  . pantoprazole  40 mg Oral Daily  . rosuvastatin  10 mg Oral Daily  . sevelamer carbonate  3,200 mg Oral TID WC  . sodium chloride flush  3 mL Intravenous Q12H   Continuous Infusions: . sodium chloride    . sodium chloride 10 mL/hr at 06/19/20 0600   PRN Meds: sodium chloride, acetaminophen, albuterol, bisacodyl, bismuth subsalicylate, hydrALAZINE, HYDROmorphone (DILAUDID) injection, labetalol, metoprolol tartrate, ondansetron (ZOFRAN) IV, oxyCODONE, QUEtiapine, senna-docusate, sodium chloride flush   Vital Signs    Vitals:   06/21/20 0357 06/21/20 0745 06/21/20 1932 06/22/20 0446  BP: 99/65 98/70 109/73 (!) 89/63  Pulse: 83 82 96   Resp: 18 18 19 20   Temp: 98.2 F (36.8 C) 98.3 F (36.8 C) 99.4 F (37.4 C) 99.4 F (37.4 C)  TempSrc: Axillary Oral Oral Oral  SpO2: 97% 97% 98% 100%  Weight: 72.9 kg   73.9 kg  Height:        Intake/Output Summary (Last 24 hours) at 06/22/2020 0857 Last data filed at 06/22/2020 0656 Gross per 24 hour  Intake 120 ml  Output --  Net 120 ml   Last 3 Weights 06/22/2020 06/21/2020 06/20/2020  Weight (lbs) 162 lb 14.7 oz 160 lb 11.5 oz 162 lb 14.7 oz  Weight (kg) 73.9 kg 72.9 kg 73.9 kg       Telemetry    Atrial flutter rates 80-90s - Personally Reviewed  ECG    No new tracing.  Physical Exam   GEN: No acute distress.   Neck: No JVD Cardiac: RRR, no murmurs, rubs, or gallops.  Respiratory: Clear to auscultation bilaterally. GI: Soft, nontender, non-distended  MS: No edema; No deformity. Neuro:  Nonfocal  Psych: Normal affect   Labs    High Sensitivity Troponin:  No results for input(s): TROPONINIHS in the last 720 hours.    Chemistry Recent Labs  Lab 06/17/20 0359 06/17/20 1925 06/20/20 0832  NA 137 136 132*  K 3.9 4.4 5.1  CL 93* 92* 94*  CO2 29 32 26  GLUCOSE 105* 96 89  BUN 24* 31* 31*  CREATININE 9.22* 9.86* 10.29*  CALCIUM 7.9* 8.6* 9.3  ALBUMIN 2.8* 2.9* 2.6*  GFRNONAA 6* 5* 5*  ANIONGAP 15 12 12      Hematology Recent Labs  Lab 06/17/20 1925 06/19/20 0038 06/20/20 0832  WBC 3.3* 3.6* 5.5  RBC 2.96* 2.85* 2.72*  HGB 9.5* 9.3* 8.7*  HCT 30.7* 29.6* 28.6*  MCV 103.7* 103.9* 105.1*  MCH 32.1 32.6 32.0  MCHC 30.9 31.4 30.4  RDW 21.2* 20.2* 19.9*  PLT 87* 100* 113*  BNPNo results for input(s): BNP, PROBNP in the last 168 hours.   DDimer No results for input(s): DDIMER in the last 168 hours.   Radiology    DG Chest 1 View  Result Date: 06/20/2020 CLINICAL DATA:  Cough and fever EXAM: CHEST  1 VIEW COMPARISON:  January 14, 2020 FINDINGS: The heart size is enlarged but stable from prior study. There is minimal vascular congestion without overt pulmonary edema. Aortic calcifications are noted. There is no pneumothorax. No large pleural effusion. No focal infiltrate. No acute osseous abnormality. IMPRESSION: 1. Cardiomegaly with minimal vascular congestion without overt pulmonary edema. 2. No focal infiltrate. Electronically Signed   By: Constance Holster M.D.   On: 06/20/2020 19:14    Cardiac Studies   N/a   Patient Profile     64 y.o. male with a history of mild CAD, anemia, COPD, ESRD on HD, Hepatitis C, HTN, PAD, thyroid  disease, paroxysmal atrial fibrillation, tobacco abusewhowas seenfor the evaluation of atrial fibrillationat the request ofDr. Donzetta Matters.  Assessment & Plan    1. Atrial Flutter with IDU:PBDHD are stable. Mostly in the 90s. Blood pressures are stable. Unclear how long he has been in this rhythm. --has been transitioned to oral amiodarone and tolerating as well as Eliquis. Consider stopping ASA with the need for DOAC. --This patients CHA2DS2-VASc Scoreof at least 3 -- will arrange for outpatient follow up, can consider DCCV after 4 weeks of [place] if remains in aflutter  2. S/p right common/external leg artery stenting: developed significant scrotal hematoma and taken back to the OR with exploration and primary repair. Hgb 9.5 post transfusion.Further management per primary -- current on ASA/plavix. Consider stopping ASA with need for DOAC -- pending rehab placement  3. ESRD on IX:[id number] following. Next HD 12/14  4. HLD: on statin therapy  5. Cough/fever: BC negative to date. Further management per primary  For questions or updates, please contact [place] Please consult [email or web address] for contact info under        Signed, Reino Bellis, NP  06/22/2020, 8:57 AM

## 2020-06-22 NOTE — Progress Notes (Addendum)
  Progress Note    06/22/2020 7:47 AM 7 Days Post-Op  Subjective:  Says he has not ambulated much. Minimal pain. Wanting to go home   Vitals:   06/21/20 1932 06/22/20 0446  BP: 109/73 (!) 89/63  Pulse: 96   Resp: 19 20  Temp: 99.4 F (37.4 C) 99.4 F (37.4 C)  SpO2: 98% 100%   Physical Exam: Cardiac: regular Lungs: non labored Incisions: left groin staples intact. Hematoma stable Extremities:  2+ femoral pulses bilaterally, scrotal swelling reduced. Bilateral lower extremities well perfused and warm. Doppler Dp left foot Abdomen: soft, non tender, non distended Neurologic: alert and oriented  CBC    Component Value Date/Time   WBC 5.5 06/20/2020 0832   RBC 2.72 (L) 06/20/2020 0832   HGB 8.7 (L) 06/20/2020 0832   HGB 11.0 (L) 03/27/2018 1420   HCT 28.6 (L) 06/20/2020 0832   HCT 32.7 (L) 03/27/2018 1420   PLT 113 (L) 06/20/2020 0832   PLT 174 03/27/2018 1420   MCV 105.1 (H) 06/20/2020 0832   MCV 101 (H) 03/27/2018 1420   MCH 32.0 06/20/2020 0832   MCHC 30.4 06/20/2020 0832   RDW 19.9 (H) 06/20/2020 0832   RDW 14.6 03/27/2018 1420   LYMPHSABS 2.0 01/14/2020 0600   LYMPHSABS 0.9 03/27/2018 1420   MONOABS 0.9 01/14/2020 0600   EOSABS 0.1 01/14/2020 0600   EOSABS 0.1 03/27/2018 1420   BASOSABS 0.0 01/14/2020 0600   BASOSABS 0.0 03/27/2018 1420    BMET    Component Value Date/Time   NA 132 (L) 06/20/2020 0832   NA 140 05/15/2018 1632   K 5.1 06/20/2020 0832   CL 94 (L) 06/20/2020 0832   CO2 26 06/20/2020 0832   GLUCOSE 89 06/20/2020 0832   BUN 31 (H) 06/20/2020 0832   BUN 38 (H) 05/15/2018 1632   CREATININE 10.29 (H) 06/20/2020 0832   CALCIUM 9.3 06/20/2020 0832   GFRNONAA 5 (L) 06/20/2020 0832   GFRAA 6 (L) 01/18/2020 1232    INR    Component Value Date/Time   INR 1.23 08/19/2018 0528     Intake/Output Summary (Last 24 hours) at 06/22/2020 0747 Last data filed at 06/22/2020 0656 Gross per 24 hour  Intake 120 ml  Output --  Net 120 ml      Assessment/Plan:  64 y.o. male is s/p R CIA and EIA stenting with subsequent L CFA repair 7 Days Post-Op. Bilateral lower extremities well perfused and warm with doppler left Dp signal. Scrotal swelling resolved. Pain minimal. Encourage continued mobilization. PT/OT recommending SNF. Patient wanting to go home vs SNF. TOC continuing to work on d/c planning and assistance with DOAC  DVT prophylaxis:  apixaban   Karoline Caldwell, [facility] [phone] 06/22/2020 7:47 AM   I have independently interviewed and examined patient and agree with PA assessment and plan above. Plan to dc likely home when doac approved.  Jervon Ream C. Donzetta Matters, MD [facility] [place] Office: [phone] Pager: [phone]

## 2020-06-22 NOTE — Discharge Summary (Signed)
[facility] Discharge Summary   Patient ID:  Raymond [name] MRN: [id number] DOB/AGE: April 13, 1956 64 y.o.  Admit date: 06/15/2020 Discharge date: 06/23/2020 Date of Surgery: 06/15/2020 Surgeon: Surgeon(s): Waynetta Sandy, MD  Admission Diagnosis: PAD (peripheral artery disease) [facility]) [I73.9]  Discharge Diagnoses:  PAD (peripheral artery disease) ([place]) [I73.9]  Secondary Diagnoses: Past Medical History:  Diagnosis Date  . Anemia   . CAD (coronary artery disease)    a. 03/2018: cath showing 20 to 30% stenosis along the LAD, luminal irregularities along the RCA, and angiographically normal LCx  . COPD (chronic obstructive pulmonary disease) ([place])   . Dyspnea    "with too much fluid"  . ESRD (end stage renal disease) on dialysis [facility])    "MWF; Jeneen Rinks" (07/22/17)  . Hemodialysis patient ([place])   . Hepatitis C    Still positive s/p liver biopsy at [facility]  and interferon therapy for 6 months. Most recent lab work was on 10/24/12  . Hepatitis C    "took the tx; gone now" (12/05/2016)  Was treated  . History of blood transfusion ~ 2012/2013   "related to my kidneys; blood was low"  . Hypertension   . Peripheral arterial disease ([place])   . Substance abuse ([place])   . Thyroid disease     Procedure(s): GROIN  EXPLORATION  Discharged Condition: fair  HPI: 64 y.o. male, who is back for follow-up of vascular disease.  He has a history of a left femoral to below-knee popliteal artery bypass graft with vein on 06-08-2018 for claudication by Dr. Donnetta Hutching.  He had a significant amount of swelling following his surgery.  DVT study was negative.  He is now having significant symptoms in his right leg.  He does not have a car and walks everywhere.  He has trouble walking any distance when it involves going up a [place].  He does not have any open wounds or rest pain.  The patient is on chronic hemodialysis on Monday Wednesday Friday.  He suffers from COPD and is a  smoker.  He is medically managed for hypertension  The patient's right leg has become very symptomatic.  I have recommended proceeding with angiography to define his anatomy.  If he is a candidate for percutaneous intervention this would be performed at that time.  The patient understands that he may require surgical revascularization which would be done at a later date.  He would like to get this done as soon as possible.  He is on dialysis Tuesday Thursday Saturday and so I will try to arrange this for a nondialysis day.   Hospital Course:  Raymond [name] is a 64 y.o. male is S/P Right Procedure(s): Ultrasound guided cannulation of left common femoral artery, aortogram with bilateral lower extremity runoff. Stent of right common and external iliac arteries with 9 x 37 mm biliary express balloon expandable stent. Mynx device closure of left common femoral artery  Patient tolerated the procedure well as was taken to the recovery room in stable condition. He subsequently developed extensive scrotal hematoma on the left with presumed failure of the minx closure device. He was subsequently taken to the operating room for exploration and primary repair of the artery GROIN  EXPLORATION  Extubated: POD # 0  Post-op wounds healing well Pt. Ambulating, voiding and taking PO diet without difficulty. Pt pain controlled with PO pain meds. Labs as below Complications: failure of minx closure device requiring emergent return to the OR for exploration and  primary closure of left common femoral artery  Patient was admitted following need for return to OR.   POD#1He had some confusion and lethargy overnight. He received 2 units of PRBC due to post op acute blood loss anemia. Left groin without continued bleeding. Left lower extremity well perfused with brisk doppler signals. Very tense and painful scrotum.Patient had inpatient hemodialysis. Patient began to have atrial flutter with HR's in the 90's -120s in  HD. Cardiology was consulted. Patient was started on IV amiodarone.  He was continued on his aspirin and plavix.  POD#2 Patient continued to have scrotal discomfort and left groin pain. Incision intact. Bilateral lower extremities well perfused with brisk doppler signals. No signs of active bleeding. Hemodynamically stable post transfusion. Blood pressure remained soft. Home blood pressure medications held. Encourage ambulation  POD#3 continued to have atrial flutter rates in the 90s. Tolerating IV amiodarone per Cardiology. Pain improving. Left groin without hematoma. Continued scrotal and penile swelling but improving. Underwent Hemodialysis again. Worked with PT/ OT who recommended SNF. Encourage ambulation as tolerated  POD#4 lower extremities with continued adequate perfusion. Resolving scrotal pain and swelling. TOC consulted for SNF placement and DOAC affordability. Cardiology transitioned patient to amiodarone 400 mg BID. On aspirin and Plavix. Cardiology recommending Eliquis BID for atrial flutter management. PT/ OT still recommending SNF. Patient refusing SNF and wanting to go home with [facility] PT/ OT services. Order for this placed.  POD#5 -POD#7 consisted of increased mobilization and pain control. Underwent hemodialysis. Continued atrial flutter management with amiodarone and Eliquis. Plavix continued and aspirin was discontinued. Insurance copay for Eliquis very high. Patient given 1 month free card through transition of care pharmacy. He will follow up with Cardiology in 1 month for further atrial flutter management. Prescription for oral amiodarone 400 mg for 1 week and subsequent amiodarone 200 mg daily to follow was sent to [place] for patient as well as Plavix and statin. PDMP was reviewed and post operative pain medication was also sent to [place] for patient to have at time of discharge. [place] services arranged for patient. He has post operative follow up arranged with Dr. Trula Slade on  07/20/20.  Consults:  Treatment Team:  Lbcardiology, Michae Kava, MD Roney Jaffe, MD  Significant Diagnostic Studies: CBC Lab Results  Component Value Date   WBC 5.5 06/20/2020   HGB 8.7 (L) 06/20/2020   HCT 28.6 (L) 06/20/2020   MCV 105.1 (H) 06/20/2020   PLT 113 (L) 06/20/2020    BMET    Component Value Date/Time   NA 132 (L) 06/20/2020 0832   NA 140 05/15/2018 1632   K 5.1 06/20/2020 0832   CL 94 (L) 06/20/2020 0832   CO2 26 06/20/2020 0832   GLUCOSE 89 06/20/2020 0832   BUN 31 (H) 06/20/2020 0832   BUN 38 (H) 05/15/2018 1632   CREATININE 10.29 (H) 06/20/2020 0832   CALCIUM 9.3 06/20/2020 0832   GFRNONAA 5 (L) 06/20/2020 0832   GFRAA 6 (L) 01/18/2020 1232   COAG Lab Results  Component Value Date   INR 1.23 08/19/2018   INR 1.27 08/18/2018   INR 1.55 08/13/2018     Disposition:  Discharge to :Home Discharge Instructions    Discharge patient   Complete by: As directed    Once [facility] PT/ OT arranged   Discharge disposition: 01-Home or Self Care   Discharge patient date: 06/22/2020     Allergies as of 06/22/2020   No Known Allergies     Medication List  STOP taking these medications   labetalol 200 MG tablet Commonly known as: NORMODYNE     TAKE these medications   albuterol 108 (90 Base) MCG/ACT inhaler Commonly known as: VENTOLIN HFA Inhale 2 puffs into the lungs every 4 (four) hours as needed for shortness of breath.   amiodarone 200 MG tablet Commonly known as: PACERONE Take 1 tablet (200 mg total) by mouth daily. Take two tablets once a day for 7 days. Starting on 06/30/20 take only one tablet daily Start taking on: June 30, 2020   amLODipine 2.5 MG tablet Commonly known as: NORVASC Take 2.5 mg by mouth at bedtime.   apixaban 5 MG Tabs tablet Commonly known as: ELIQUIS Take 1 tablet (5 mg total) by mouth 2 (two) times daily.   bismuth subsalicylate 932 MG chewable tablet Commonly known as: PEPTO BISMOL Chew 524 mg by mouth as  needed for indigestion or diarrhea or loose stools.   clopidogrel 75 MG tablet Commonly known as: PLAVIX Take 1 tablet (75 mg total) by mouth daily.   oxyCODONE-acetaminophen 5-325 MG tablet Commonly known as: Percocet Take 1 tablet by mouth every 4 (four) hours as needed for severe pain.   pantoprazole 40 MG tablet Commonly known as: Protonix Take 1 tablet (40 mg total) by mouth daily.   QUEtiapine 100 MG tablet Commonly known as: SEROQUEL Take 100 mg by mouth at bedtime as needed (sleep).   rosuvastatin 10 MG tablet Commonly known as: CRESTOR Take 1 tablet (10 mg total) by mouth daily.   sevelamer carbonate 800 MG tablet Commonly known as: RENVELA Take 4 tablets (3,200 mg total) by mouth 3 (three) times daily with meals. What changed:   how much to take  when to take this  additional instructions      Verbal and written Discharge instructions given to the patient.  [facility]  Discharge Instructions  Lower Extremity Angiogram; Angioplasty/Stenting  Please refer to the following instructions for your post-procedure care. Your surgeon or physician assistant will discuss any changes with you.  Activity  Avoid lifting more than 8 pounds (1 gallons of milk) for 5 days after your procedure. You may walk as much as you can tolerate. It's OK to drive after 72 hours.  Bathing/Showering  You may shower the day after your procedure. If you have a bandage, you may remove it at 24- 48 hours. Clean your incision site with mild soap and water. Pat the area dry with a clean towel.  Diet  Resume your pre-procedure diet. There are no special food restrictions following this procedure. All patients with peripheral vascular disease should follow a low fat/low cholesterol diet. In order to heal from your surgery, it is CRITICAL to get adequate nutrition. Your body requires vitamins, minerals, and protein. Vegetables are the best source of vitamins  and minerals. Vegetables also provide the perfect balance of protein. Processed food has little nutritional value, so try to avoid this.  Medications  Resume taking all of your medications unless your doctor tells you not to. If your incision is causing pain, you may take over-the-counter pain relievers such as acetaminophen (Tylenol)  Follow Up  Follow up will be arranged at the time of your procedure. You may have an office visit scheduled or may be scheduled for surgery. Ask your surgeon if you have any questions.  Please call us immediately for any of the following conditions: .Severe or worsening pain your legs or feet at rest or with walking. [name]  Increased pain, redness, drainage at your groin puncture site. .Fever of 101 degrees or higher. .If you have any mild or slow bleeding from your puncture site: lie down, apply firm constant pressure over the area with a piece of gauze or a clean wash cloth for 30 minutes- no peeking!, call 911 right away if you are still bleeding after 30 minutes, or if the bleeding is heavy and unmanageable.  Reduce your risk factors of vascular disease:  . Stop smoking. If you would like help call [facility] at 1-800-QUIT-NOW (786) 369-9706) or [place] at 731-764-3817. . Manage your cholesterol . Maintain a desired weight . Control your diabetes . Keep your blood pressure down .  If you have any questions, please call the office at [phone]   Wound care per Discharge AVS  Follow-up Information    Serafina Mitchell, MD Follow up in 1 month(s).   Specialties: Vascular Surgery, Cardiology Why: the office will contact patient with their follow up  Contact information: [address] [place] [address] [phone]               Signed: Karoline Caldwell 06/23/2020, 7:56 AM

## 2020-06-22 NOTE — Progress Notes (Signed)
Physical Therapy Treatment Patient Details Name: Raymond [name] MRN: [id number] DOB: October 17, 1955 Today's Date: 06/22/2020    History of Present Illness Pt is 64 yo male with PMH including L femoral below -knee popliteal bypass, CAD, COPD, HTN, PAD.  Pt presented with R LE claudication. Pt s/p R common and external artery stenting 06/15/20 with development of scrotal hematoma on L with failure of minx device and had to return to surgery later that day for repair and evacuation of hematoma. Also, complicated by aflutter with RVR.    PT Comments    Pt with decreased pain during session. Pt demonstrating improved balance with ambulation without AD. Pt able to perform exercises for HEP without assist. Pt does continues to demonstrate limitations in endurance, strength, balance and safety and will benefit from skilled PT to address deficits to maximize independence with functional mobility prior to discharge.    Follow Up Recommendations  SNF     Equipment Recommendations  Rolling walker with 5" wheels;3in1 (PT)    Recommendations for Other Services       Precautions / Restrictions Precautions Precaution Comments: Pain - premedicate Restrictions Weight Bearing Restrictions: No    Mobility  Bed Mobility Overal bed mobility: Modified Independent                Transfers Overall transfer level: Needs assistance Equipment used: None Transfers: Sit to/from Stand Sit to Stand: Min guard            Ambulation/Gait Ambulation/Gait assistance: Min guard Gait Distance (Feet): 20 Feet   Gait Pattern/deviations: Step-through pattern;Wide base of support     General Gait Details: increased lateral weight shifting R and L   Stairs             Wheelchair Mobility    Modified Rankin (Stroke Patients Only)       Balance   Sitting-balance support: No upper extremity supported;Feet supported Sitting balance-Leahy Scale: Good     Standing balance support: No upper  extremity supported Standing balance-Leahy Scale: Fair                              Publishing copy Exercises - Lower Extremity Heel Slides: AROM;Strengthening;Both;10 reps;Supine Hip ABduction/ADduction: AROM;Strengthening;Both;10 reps;Supine Straight Leg Raises: AROM;Strengthening;Both;10 reps;Supine Hip Flexion/Marching: AROM;Strengthening;10 reps;Both;Standing    General Comments        Pertinent Vitals/Pain Pain Assessment: No/denies pain    Home Living                      Prior Function            PT Goals (current goals can now be found in the care plan section) Acute Rehab PT Goals Patient Stated Goal: none stated during session PT Goal Formulation: With patient Time For Goal Achievement: 07/02/20 Potential to Achieve Goals: Good Progress towards PT goals: Progressing toward goals    Frequency    Min 3X/week      PT Plan Current plan remains appropriate    Co-evaluation              AM-PAC PT "6 Clicks" Mobility   Outcome Measure  Help needed turning  from your back to your side while in a flat bed without using bedrails?: None Help needed moving from lying on your back to sitting on the side of a flat bed without using bedrails?: None Help needed moving to and from a bed to a chair (including a wheelchair)?: A Little Help needed standing up from a chair using your arms (e.g., wheelchair or bedside chair)?: A Little Help needed to walk in hospital room?: A Little Help needed climbing 3-5 steps with a railing? : A Little 6 Click Score: 20    End of Session Equipment Utilized During Treatment: Gait belt Activity Tolerance: Patient tolerated treatment well Patient left: in chair;with call bell/phone within reach Nurse Communication: Mobility status PT Visit Diagnosis: Pain;Other abnormalities of gait and mobility (R26.89);Muscle weakness  (generalized) (M62.81)     Time: 4818-5631 PT Time Calculation (min) (ACUTE ONLY): 23 min  Charges:  $Gait Training: 8-22 mins $Therapeutic Exercise: 8-22 mins                     Lyanne Co, DPT Acute Rehabilitation Services [phone]   [place] 06/22/2020, 11:00 AM

## 2020-06-23 ENCOUNTER — Telehealth: Payer: Self-pay | Admitting: Nephrology

## 2020-06-23 DIAGNOSIS — L299 Pruritus, unspecified: Secondary | ICD-10-CM | POA: Diagnosis not present

## 2020-06-23 DIAGNOSIS — Z992 Dependence on renal dialysis: Secondary | ICD-10-CM | POA: Diagnosis not present

## 2020-06-23 DIAGNOSIS — R509 Fever, unspecified: Secondary | ICD-10-CM | POA: Diagnosis not present

## 2020-06-23 DIAGNOSIS — R519 Headache, unspecified: Secondary | ICD-10-CM | POA: Diagnosis not present

## 2020-06-23 DIAGNOSIS — N2581 Secondary hyperparathyroidism of renal origin: Secondary | ICD-10-CM | POA: Diagnosis not present

## 2020-06-23 DIAGNOSIS — D631 Anemia in chronic kidney disease: Secondary | ICD-10-CM | POA: Diagnosis not present

## 2020-06-23 DIAGNOSIS — R52 Pain, unspecified: Secondary | ICD-10-CM | POA: Diagnosis not present

## 2020-06-23 DIAGNOSIS — D509 Iron deficiency anemia, unspecified: Secondary | ICD-10-CM | POA: Diagnosis not present

## 2020-06-23 DIAGNOSIS — N186 End stage renal disease: Secondary | ICD-10-CM | POA: Diagnosis not present

## 2020-06-23 NOTE — Telephone Encounter (Signed)
Transition of care contact from inpatient facility  Date of Discharge: 06/22/20 Date of Contact: 06/23/20 Method of contact: Phone  Attempted to contact patient to discuss transition of care from inpatient admission. Patient did not answer the phone and unable to leave VM.

## 2020-06-25 DIAGNOSIS — L299 Pruritus, unspecified: Secondary | ICD-10-CM | POA: Diagnosis not present

## 2020-06-25 DIAGNOSIS — R519 Headache, unspecified: Secondary | ICD-10-CM | POA: Diagnosis not present

## 2020-06-25 DIAGNOSIS — N2581 Secondary hyperparathyroidism of renal origin: Secondary | ICD-10-CM | POA: Diagnosis not present

## 2020-06-25 DIAGNOSIS — R52 Pain, unspecified: Secondary | ICD-10-CM | POA: Diagnosis not present

## 2020-06-25 DIAGNOSIS — Z992 Dependence on renal dialysis: Secondary | ICD-10-CM | POA: Diagnosis not present

## 2020-06-25 DIAGNOSIS — R509 Fever, unspecified: Secondary | ICD-10-CM | POA: Diagnosis not present

## 2020-06-25 DIAGNOSIS — D509 Iron deficiency anemia, unspecified: Secondary | ICD-10-CM | POA: Diagnosis not present

## 2020-06-25 DIAGNOSIS — N186 End stage renal disease: Secondary | ICD-10-CM | POA: Diagnosis not present

## 2020-06-25 DIAGNOSIS — D631 Anemia in chronic kidney disease: Secondary | ICD-10-CM | POA: Diagnosis not present

## 2020-06-26 LAB — CULTURE, BLOOD (ROUTINE X 2)
Culture: NO GROWTH
Culture: NO GROWTH
Special Requests: ADEQUATE

## 2020-06-30 ENCOUNTER — Other Ambulatory Visit: Payer: Self-pay | Admitting: *Deleted

## 2020-06-30 DIAGNOSIS — I120 Hypertensive chronic kidney disease with stage 5 chronic kidney disease or end stage renal disease: Secondary | ICD-10-CM | POA: Diagnosis not present

## 2020-06-30 DIAGNOSIS — I739 Peripheral vascular disease, unspecified: Secondary | ICD-10-CM | POA: Diagnosis not present

## 2020-06-30 DIAGNOSIS — N2581 Secondary hyperparathyroidism of renal origin: Secondary | ICD-10-CM | POA: Diagnosis not present

## 2020-06-30 DIAGNOSIS — R52 Pain, unspecified: Secondary | ICD-10-CM | POA: Diagnosis not present

## 2020-06-30 DIAGNOSIS — L299 Pruritus, unspecified: Secondary | ICD-10-CM | POA: Diagnosis not present

## 2020-06-30 DIAGNOSIS — D509 Iron deficiency anemia, unspecified: Secondary | ICD-10-CM | POA: Diagnosis not present

## 2020-06-30 DIAGNOSIS — R509 Fever, unspecified: Secondary | ICD-10-CM | POA: Diagnosis not present

## 2020-06-30 DIAGNOSIS — D631 Anemia in chronic kidney disease: Secondary | ICD-10-CM | POA: Diagnosis not present

## 2020-06-30 DIAGNOSIS — Z992 Dependence on renal dialysis: Secondary | ICD-10-CM | POA: Diagnosis not present

## 2020-06-30 DIAGNOSIS — N186 End stage renal disease: Secondary | ICD-10-CM | POA: Diagnosis not present

## 2020-06-30 DIAGNOSIS — R519 Headache, unspecified: Secondary | ICD-10-CM | POA: Diagnosis not present

## 2020-07-01 ENCOUNTER — Other Ambulatory Visit: Payer: Self-pay

## 2020-07-01 ENCOUNTER — Ambulatory Visit (INDEPENDENT_AMBULATORY_CARE_PROVIDER_SITE_OTHER): Payer: Self-pay | Admitting: Physician Assistant

## 2020-07-01 ENCOUNTER — Telehealth: Payer: Self-pay

## 2020-07-01 VITALS — BP 134/88 | HR 88 | Temp 98.2°F | Ht 68.0 in | Wt 163.8 lb

## 2020-07-01 DIAGNOSIS — I700 Atherosclerosis of aorta: Secondary | ICD-10-CM | POA: Diagnosis not present

## 2020-07-01 DIAGNOSIS — I25118 Atherosclerotic heart disease of native coronary artery with other forms of angina pectoris: Secondary | ICD-10-CM | POA: Diagnosis not present

## 2020-07-01 DIAGNOSIS — Z79899 Other long term (current) drug therapy: Secondary | ICD-10-CM | POA: Diagnosis not present

## 2020-07-01 DIAGNOSIS — N186 End stage renal disease: Secondary | ICD-10-CM | POA: Diagnosis not present

## 2020-07-01 DIAGNOSIS — I739 Peripheral vascular disease, unspecified: Secondary | ICD-10-CM

## 2020-07-01 DIAGNOSIS — E079 Disorder of thyroid, unspecified: Secondary | ICD-10-CM | POA: Diagnosis not present

## 2020-07-01 DIAGNOSIS — Z0001 Encounter for general adult medical examination with abnormal findings: Secondary | ICD-10-CM | POA: Diagnosis not present

## 2020-07-01 MED ORDER — OXYCODONE-ACETAMINOPHEN 5-325 MG PO TABS: 1.0000 | ORAL_TABLET | Freq: Four times a day (QID) | ORAL | 0 refills | Status: DC | PRN

## 2020-07-01 NOTE — Telephone Encounter (Signed)
Patient being seen at another MD office today. Received call from their office, they are concerned about patient's groin wound s/p aogram & groin exploration on 12/6. It is red, swollen and very painful. No fever, and no drainage noted. Placed patient on PA schedule today.

## 2020-07-01 NOTE — Telephone Encounter (Signed)
[facility] called - patient was seen at [facility] office and then [facility] today. Our office called in [place] called in oxycodone. Patient only needs one RX. Attempted to call [facility] back - unable to reach after holding and x3 phone calls so left message on MD line to cancel Dr. Dagoberto Reef and fill the percocet from [facility]. Attempted to call Dr. Thompson Caul office and let him know, no answer.

## 2020-07-01 NOTE — Progress Notes (Signed)
POST OPERATIVE OFFICE NOTE    CC:  F/u for surgery  HPI:  This is a 64 y.o. male who is s/p Stenting of the right CIA and EIA with 9x83mm biliary express balloon expandable stent on 06/15/2020 by Dr. Donzetta Matters with subsequent left groin and scrotal hematoma requiring evacuation of left groin and scrotal hematoma and primary repair of left CFA cannulation site also on 06/15/2020 by Dr. Donzetta Matters.  Pt returns today for follow up as he was sent from his PCP with concerns about his incision.  Pt denies any drainage from the incision.  He has not had any fevers.  He continues to have some pain around the incision.  He states his feet feel fine.    No Known Allergies  Current Outpatient Medications  Medication Sig Dispense Refill  . albuterol (VENTOLIN HFA) 108 (90 Base) MCG/ACT inhaler Inhale 2 puffs into the lungs every 4 (four) hours as needed for shortness of breath.     [name] amiodarone (PACERONE) 200 MG tablet Take 1 tablet (200 mg total) by mouth daily. Take two tablets once a day for 7 days. Starting on 06/30/20 take only one tablet daily 30 tablet 2  . amLODipine (NORVASC) 2.5 MG tablet Take 2.5 mg by mouth at bedtime.     [name] apixaban (ELIQUIS) 5 MG TABS tablet Take 1 tablet (5 mg total) by mouth 2 (two) times daily. 60 tablet 0  . bismuth subsalicylate (PEPTO BISMOL) 262 MG chewable tablet Chew 524 mg by mouth as needed for indigestion or diarrhea or loose stools.    . clopidogrel (PLAVIX) 75 MG tablet Take 1 tablet (75 mg total) by mouth daily. 30 tablet 3  . oxyCODONE-acetaminophen (PERCOCET) 5-325 MG tablet Take 1 tablet by mouth every 6 (six) hours as needed for severe pain. 20 tablet 0  . pantoprazole (PROTONIX) 40 MG tablet Take 1 tablet (40 mg total) by mouth daily. 30 tablet 0  . QUEtiapine (SEROQUEL) 100 MG tablet Take 100 mg by mouth at bedtime as needed (sleep).     . rosuvastatin (CRESTOR) 10 MG tablet Take 1 tablet (10 mg total) by mouth daily. 30 tablet 11  . sevelamer carbonate (RENVELA)  800 MG tablet Take 4 tablets (3,200 mg total) by mouth 3 (three) times daily with meals. (Patient taking differently: Take 4,000 mg by mouth See admin instructions. Take 4000 mg with each meal and snack) 120 tablet 0   No current facility-administered medications for this visit.   Facility-Administered Medications Ordered in Other Visits  Medication Dose Route Frequency Provider Last Rate Last Admin  . midazolam (VERSED) 5 MG/5ML injection    Anesthesia Intra-op Sammie Bench, CRNA   2 mg at 04/11/18 1042     ROS:  See HPI  Physical Exam:  Today's Vitals   07/01/20 1357  BP: 134/88  Pulse: 88  Temp: 98.2 F (36.8 C)  TempSrc: Skin  SpO2: 97%  Weight: 163 lb 12.8 oz (74.3 kg)  Height: 5\' 8"  (1.727 m)  PainSc: 8    Body mass index is 24.91 kg/m.   Incision:  Looks fine with staples in tact.  He has tenderness around the incision where there is residual hematoma.  There is not any erythema or drainage from incision.   Extremities:  Palpable right DP pulse with brisk doppler signals right DP/PT/peroneal and monophasic doppler signals left DP and peroneal. Right femoral pulse is 2+ palpable.    Assessment/Plan:  This is a 64 y.o. male who is  s/p: Stenting of the right CIA and EIA with 9x74mm biliary express balloon expandable stent on 06/15/2020 by Dr. Donzetta Matters with subsequent left groin and scrotal hematoma requiring evacuation of left groin and scrotal hematoma and primary repair of left CFA cannulation site also on 06/15/2020 by Dr. Donzetta Matters   -pt was sent over today by PCP with concerns about incision.  Upon inspection, incision looks fine with staples in tact.  There is no erythema or drainage from the incision.  He has tenderness around the incision over residual hematoma.  He has not had any fevers.  Discussed with pt that it may take weeks for the hematoma to resolve.  -remove every other staple today. -pt has follow up with vein mapping and ABI on 07/17/2020 and f/u appt with Dr.  Trula Slade on 1/10.  I have asked that they change the appt to Dr. Donzetta Matters on 07/17/2020 as he did his operation in the hospital. -rx sent for Percocet 5/325 one q6h prn pain #20 (twenty) no refill. PDMP reviewed.   Leontine Locket, [facility] [phone]  Clinic MD:  Scot Dock

## 2020-07-07 DIAGNOSIS — N186 End stage renal disease: Secondary | ICD-10-CM | POA: Diagnosis not present

## 2020-07-07 DIAGNOSIS — N2581 Secondary hyperparathyroidism of renal origin: Secondary | ICD-10-CM | POA: Diagnosis not present

## 2020-07-07 DIAGNOSIS — R509 Fever, unspecified: Secondary | ICD-10-CM | POA: Diagnosis not present

## 2020-07-07 DIAGNOSIS — L299 Pruritus, unspecified: Secondary | ICD-10-CM | POA: Diagnosis not present

## 2020-07-07 DIAGNOSIS — D509 Iron deficiency anemia, unspecified: Secondary | ICD-10-CM | POA: Diagnosis not present

## 2020-07-07 DIAGNOSIS — R52 Pain, unspecified: Secondary | ICD-10-CM | POA: Diagnosis not present

## 2020-07-07 DIAGNOSIS — Z992 Dependence on renal dialysis: Secondary | ICD-10-CM | POA: Diagnosis not present

## 2020-07-07 DIAGNOSIS — R519 Headache, unspecified: Secondary | ICD-10-CM | POA: Diagnosis not present

## 2020-07-07 DIAGNOSIS — D631 Anemia in chronic kidney disease: Secondary | ICD-10-CM | POA: Diagnosis not present

## 2020-07-09 DIAGNOSIS — R509 Fever, unspecified: Secondary | ICD-10-CM | POA: Diagnosis not present

## 2020-07-09 DIAGNOSIS — D631 Anemia in chronic kidney disease: Secondary | ICD-10-CM | POA: Diagnosis not present

## 2020-07-09 DIAGNOSIS — R52 Pain, unspecified: Secondary | ICD-10-CM | POA: Diagnosis not present

## 2020-07-09 DIAGNOSIS — D509 Iron deficiency anemia, unspecified: Secondary | ICD-10-CM | POA: Diagnosis not present

## 2020-07-09 DIAGNOSIS — N186 End stage renal disease: Secondary | ICD-10-CM | POA: Diagnosis not present

## 2020-07-09 DIAGNOSIS — Z992 Dependence on renal dialysis: Secondary | ICD-10-CM | POA: Diagnosis not present

## 2020-07-09 DIAGNOSIS — N2581 Secondary hyperparathyroidism of renal origin: Secondary | ICD-10-CM | POA: Diagnosis not present

## 2020-07-09 DIAGNOSIS — R519 Headache, unspecified: Secondary | ICD-10-CM | POA: Diagnosis not present

## 2020-07-09 DIAGNOSIS — L299 Pruritus, unspecified: Secondary | ICD-10-CM | POA: Diagnosis not present

## 2020-07-10 DIAGNOSIS — N186 End stage renal disease: Secondary | ICD-10-CM | POA: Diagnosis not present

## 2020-07-10 DIAGNOSIS — Z992 Dependence on renal dialysis: Secondary | ICD-10-CM | POA: Diagnosis not present

## 2020-07-10 DIAGNOSIS — N2889 Other specified disorders of kidney and ureter: Secondary | ICD-10-CM | POA: Diagnosis not present

## 2020-07-17 ENCOUNTER — Encounter (HOSPITAL_COMMUNITY): Payer: Medicare Other

## 2020-07-17 ENCOUNTER — Encounter: Payer: Medicare Other | Admitting: Vascular Surgery

## 2020-07-17 ENCOUNTER — Inpatient Hospital Stay (HOSPITAL_COMMUNITY): Admission: RE | Admit: 2020-07-17 | Payer: Medicare Other | Source: Ambulatory Visit

## 2020-07-20 ENCOUNTER — Encounter: Payer: Medicare Other | Admitting: Surgery

## 2020-07-28 ENCOUNTER — Inpatient Hospital Stay (HOSPITAL_COMMUNITY)
Admission: EM | Admit: 2020-07-28 | Discharge: 2020-08-03 | DRG: 377 | Disposition: A | Discharge status: Home Health | Payer: 59 | Attending: Internal Medicine | Admitting: Internal Medicine

## 2020-07-28 ENCOUNTER — Other Ambulatory Visit: Payer: Self-pay

## 2020-07-28 ENCOUNTER — Emergency Department (HOSPITAL_COMMUNITY): Payer: 59

## 2020-07-28 ENCOUNTER — Encounter (HOSPITAL_COMMUNITY): Payer: Self-pay

## 2020-07-28 DIAGNOSIS — Z9115 Patient's noncompliance with renal dialysis: Secondary | ICD-10-CM

## 2020-07-28 DIAGNOSIS — I248 Other forms of acute ischemic heart disease: Secondary | ICD-10-CM | POA: Diagnosis present

## 2020-07-28 DIAGNOSIS — Z8249 Family history of ischemic heart disease and other diseases of the circulatory system: Secondary | ICD-10-CM

## 2020-07-28 DIAGNOSIS — I4892 Unspecified atrial flutter: Secondary | ICD-10-CM | POA: Diagnosis present

## 2020-07-28 DIAGNOSIS — K5521 Angiodysplasia of colon with hemorrhage: Secondary | ICD-10-CM | POA: Diagnosis not present

## 2020-07-28 DIAGNOSIS — K922 Gastrointestinal hemorrhage, unspecified: Secondary | ICD-10-CM | POA: Diagnosis present

## 2020-07-28 DIAGNOSIS — I739 Peripheral vascular disease, unspecified: Secondary | ICD-10-CM | POA: Diagnosis present

## 2020-07-28 DIAGNOSIS — F17210 Nicotine dependence, cigarettes, uncomplicated: Secondary | ICD-10-CM | POA: Diagnosis present

## 2020-07-28 DIAGNOSIS — N186 End stage renal disease: Secondary | ICD-10-CM | POA: Diagnosis present

## 2020-07-28 DIAGNOSIS — E162 Hypoglycemia, unspecified: Secondary | ICD-10-CM | POA: Diagnosis present

## 2020-07-28 DIAGNOSIS — D61818 Other pancytopenia: Secondary | ICD-10-CM | POA: Diagnosis present

## 2020-07-28 DIAGNOSIS — D62 Acute posthemorrhagic anemia: Secondary | ICD-10-CM | POA: Diagnosis present

## 2020-07-28 DIAGNOSIS — G9341 Metabolic encephalopathy: Secondary | ICD-10-CM | POA: Diagnosis present

## 2020-07-28 DIAGNOSIS — Z992 Dependence on renal dialysis: Secondary | ICD-10-CM | POA: Diagnosis not present

## 2020-07-28 DIAGNOSIS — K921 Melena: Secondary | ICD-10-CM | POA: Diagnosis not present

## 2020-07-28 DIAGNOSIS — Z79899 Other long term (current) drug therapy: Secondary | ICD-10-CM

## 2020-07-28 DIAGNOSIS — I480 Paroxysmal atrial fibrillation: Secondary | ICD-10-CM | POA: Diagnosis present

## 2020-07-28 DIAGNOSIS — U071 COVID-19: Secondary | ICD-10-CM | POA: Diagnosis present

## 2020-07-28 DIAGNOSIS — J449 Chronic obstructive pulmonary disease, unspecified: Secondary | ICD-10-CM | POA: Diagnosis present

## 2020-07-28 DIAGNOSIS — I120 Hypertensive chronic kidney disease with stage 5 chronic kidney disease or end stage renal disease: Secondary | ICD-10-CM | POA: Diagnosis present

## 2020-07-28 DIAGNOSIS — B1920 Unspecified viral hepatitis C without hepatic coma: Secondary | ICD-10-CM | POA: Diagnosis present

## 2020-07-28 DIAGNOSIS — I2510 Atherosclerotic heart disease of native coronary artery without angina pectoris: Secondary | ICD-10-CM | POA: Diagnosis present

## 2020-07-28 DIAGNOSIS — E079 Disorder of thyroid, unspecified: Secondary | ICD-10-CM | POA: Diagnosis present

## 2020-07-28 DIAGNOSIS — D500 Iron deficiency anemia secondary to blood loss (chronic): Secondary | ICD-10-CM | POA: Diagnosis not present

## 2020-07-28 DIAGNOSIS — D649 Anemia, unspecified: Secondary | ICD-10-CM

## 2020-07-28 DIAGNOSIS — Z7901 Long term (current) use of anticoagulants: Secondary | ICD-10-CM | POA: Diagnosis not present

## 2020-07-28 DIAGNOSIS — K31819 Angiodysplasia of stomach and duodenum without bleeding: Secondary | ICD-10-CM | POA: Diagnosis not present

## 2020-07-28 DIAGNOSIS — E7800 Pure hypercholesterolemia, unspecified: Secondary | ICD-10-CM | POA: Diagnosis present

## 2020-07-28 DIAGNOSIS — Z7902 Long term (current) use of antithrombotics/antiplatelets: Secondary | ICD-10-CM | POA: Diagnosis not present

## 2020-07-28 LAB — BASIC METABOLIC PANEL
Anion gap: 18 — ABNORMAL HIGH (ref 5–15)
BUN: 67 mg/dL — ABNORMAL HIGH (ref 8–23)
CO2: 27 mmol/L (ref 22–32)
Calcium: 8.6 mg/dL — ABNORMAL LOW (ref 8.9–10.3)
Chloride: 96 mmol/L — ABNORMAL LOW (ref 98–111)
Creatinine, Ser: 11.53 mg/dL — ABNORMAL HIGH (ref 0.61–1.24)
GFR, Estimated: 4 mL/min — ABNORMAL LOW (ref >60)
Glucose, Bld: 77 mg/dL (ref 70–99)
Potassium: 3.6 mmol/L (ref 3.5–5.1)
Sodium: 141 mmol/L (ref 135–145)

## 2020-07-28 LAB — I-STAT VENOUS BLOOD GAS, ED
Acid-Base Excess: 5 mmol/L — ABNORMAL HIGH (ref 0.0–2.0)
Bicarbonate: 28.7 mmol/L — ABNORMAL HIGH (ref 20.0–28.0)
Calcium, Ion: 1.01 mmol/L — ABNORMAL LOW (ref 1.15–1.40)
HCT: 17 % — ABNORMAL LOW (ref 39.0–52.0)
Hemoglobin: 5.8 g/dL — CL (ref 13.0–17.0)
O2 Saturation: 78 %
Potassium: 3.8 mmol/L (ref 3.5–5.1)
Sodium: 141 mmol/L (ref 135–145)
TCO2: 30 mmol/L (ref 22–32)
pCO2, Ven: 36.2 mmHg — ABNORMAL LOW (ref 44.0–60.0)
pH, Ven: 7.507 — ABNORMAL HIGH (ref 7.250–7.430)
pO2, Ven: 38 mmHg (ref 32.0–45.0)

## 2020-07-28 LAB — CBG MONITORING, ED
Glucose-Capillary: 61 mg/dL — ABNORMAL LOW (ref 70–99)
Glucose-Capillary: 126 mg/dL — ABNORMAL HIGH (ref 70–99)

## 2020-07-28 LAB — CBC WITH DIFFERENTIAL/PLATELET
Abs Immature Granulocytes: 0.01 10*3/uL (ref 0.00–0.07)
Basophils Absolute: 0 10*3/uL (ref 0.0–0.1)
Basophils Relative: 0 %
Eosinophils Absolute: 0 10*3/uL (ref 0.0–0.5)
Eosinophils Relative: 0 %
HCT: 17.4 % — ABNORMAL LOW (ref 39.0–52.0)
Hemoglobin: 5.6 g/dL — CL (ref 13.0–17.0)
Immature Granulocytes: 1 %
Lymphocytes Relative: 13 %
Lymphs Abs: 0.2 10*3/uL — ABNORMAL LOW (ref 0.7–4.0)
MCH: 34.6 pg — ABNORMAL HIGH (ref 26.0–34.0)
MCHC: 32.2 g/dL (ref 30.0–36.0)
MCV: 107.4 fL — ABNORMAL HIGH (ref 80.0–100.0)
Monocytes Absolute: 0.1 10*3/uL (ref 0.1–1.0)
Monocytes Relative: 8 %
Neutro Abs: 1.1 10*3/uL — ABNORMAL LOW (ref 1.7–7.7)
Neutrophils Relative %: 78 %
Platelets: 121 10*3/uL — ABNORMAL LOW (ref 150–400)
RBC: 1.62 MIL/uL — ABNORMAL LOW (ref 4.22–5.81)
RDW: 20.9 % — ABNORMAL HIGH (ref 11.5–15.5)
WBC: 1.4 10*3/uL — CL (ref 4.0–10.5)
nRBC: 0 % (ref 0.0–0.2)

## 2020-07-28 LAB — HEPATIC FUNCTION PANEL
ALT: 9 U/L (ref 0–44)
AST: 8 U/L — ABNORMAL LOW (ref 15–41)
Albumin: 3.1 g/dL — ABNORMAL LOW (ref 3.5–5.0)
Alkaline Phosphatase: 54 U/L (ref 38–126)
Bilirubin, Direct: 0.1 mg/dL (ref 0.0–0.2)
Indirect Bilirubin: 0.3 mg/dL (ref 0.3–0.9)
Total Bilirubin: 0.4 mg/dL (ref 0.3–1.2)
Total Protein: 6.9 g/dL (ref 6.5–8.1)

## 2020-07-28 LAB — RESP PANEL BY RT-PCR (FLU A&B, COVID) ARPGX2
Influenza A by PCR: NEGATIVE
Influenza B by PCR: NEGATIVE
SARS Coronavirus 2 by RT PCR: POSITIVE — AB

## 2020-07-28 LAB — I-STAT CHEM 8, ED
BUN: 72 mg/dL — ABNORMAL HIGH (ref 8–23)
Calcium, Ion: 1.03 mmol/L — ABNORMAL LOW (ref 1.15–1.40)
Chloride: 97 mmol/L — ABNORMAL LOW (ref 98–111)
Creatinine, Ser: 12.6 mg/dL — ABNORMAL HIGH (ref 0.61–1.24)
Glucose, Bld: 72 mg/dL (ref 70–99)
HCT: 18 % — ABNORMAL LOW (ref 39.0–52.0)
Hemoglobin: 6.1 g/dL — CL (ref 13.0–17.0)
Potassium: 3.9 mmol/L (ref 3.5–5.1)
Sodium: 140 mmol/L (ref 135–145)
TCO2: 29 mmol/L (ref 22–32)

## 2020-07-28 LAB — TROPONIN I (HIGH SENSITIVITY)
Troponin I (High Sensitivity): 47 ng/L — ABNORMAL HIGH (ref <18)
Troponin I (High Sensitivity): 49 ng/L — ABNORMAL HIGH (ref <18)

## 2020-07-28 LAB — PROTIME-INR
INR: 1.5 — ABNORMAL HIGH (ref 0.8–1.2)
Prothrombin Time: 18 seconds — ABNORMAL HIGH (ref 11.4–15.2)

## 2020-07-28 LAB — AMMONIA: Ammonia: 28 umol/L (ref 9–35)

## 2020-07-28 LAB — PREPARE RBC (CROSSMATCH)

## 2020-07-28 LAB — LACTIC ACID, PLASMA: Lactic Acid, Venous: 1.9 mmol/L (ref 0.5–1.9)

## 2020-07-28 LAB — POC OCCULT BLOOD, ED: Fecal Occult Bld: POSITIVE — AB

## 2020-07-28 IMAGING — CT CT HEAD W/O CM
4 series · 16 of 47 positions shown, 18 images · non-contrast
Comparison: CT head [DATE]

CLINICAL DATA: Delirium.

EXAM:
CT HEAD WITHOUT CONTRAST
TECHNIQUE: Contiguous axial images were obtained from the base of the skull
through the vertex without intravenous contrast.

[Series 3: head wo · axial · 0.48mm/px · z∈[+456,+571]mm · 7 of 31 slices shown, 9 images]
[im 4/31  brain]
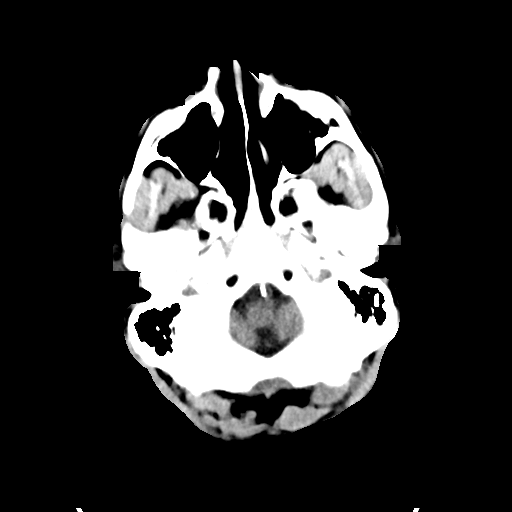
[im 4/31  bone]
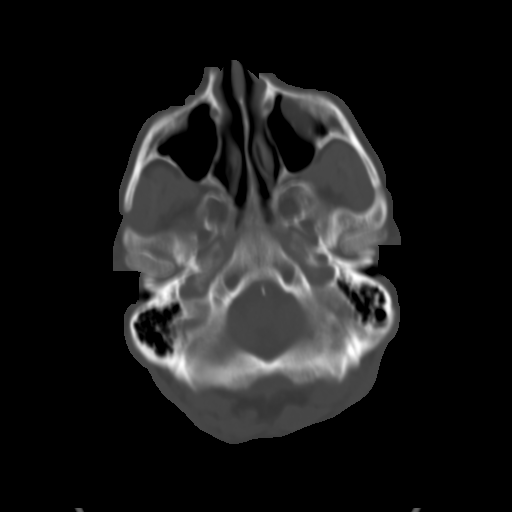
[im 8/31  brain]
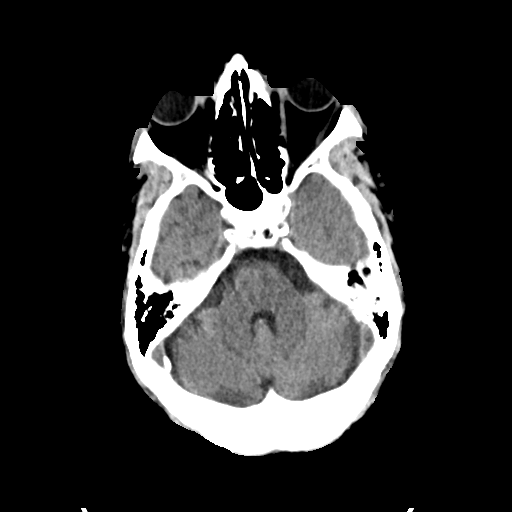
[im 12/31  brain]
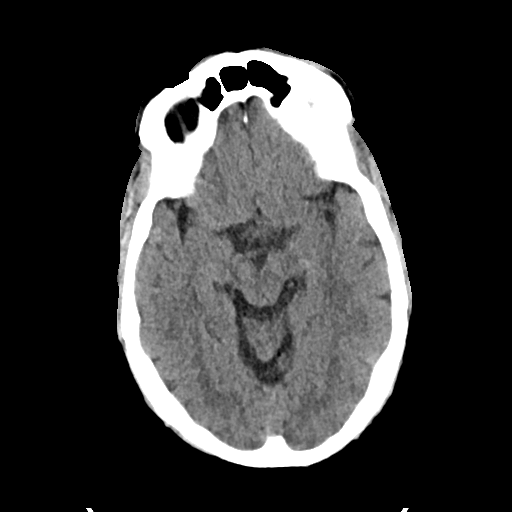
[im 16/31  brain]
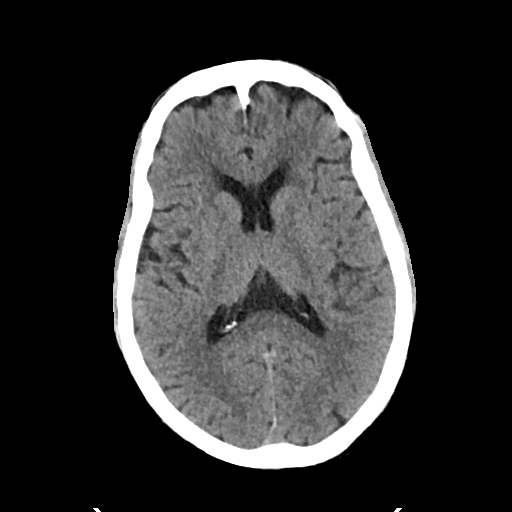
[im 19/31  brain]
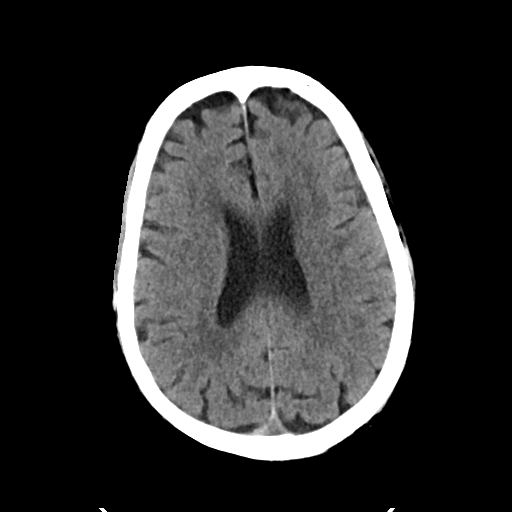
[im 19/31  bone]
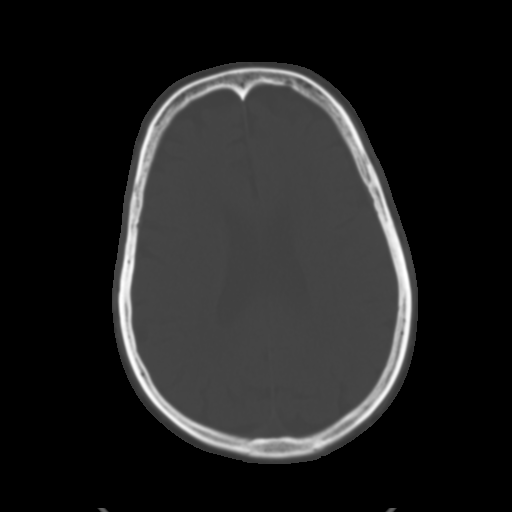
[im 23/31  brain]
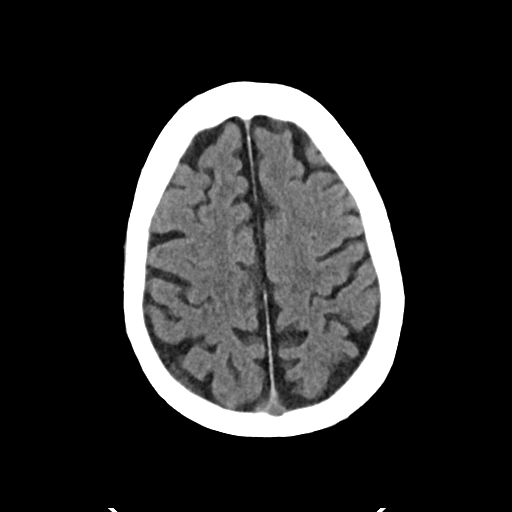
[im 27/31  brain]
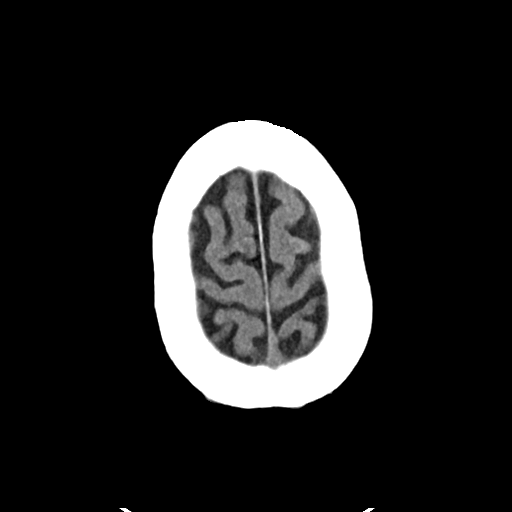

[Series 4: head bone · axial · 0.48mm/px · z∈[+455,+485]mm · 3 of 77 slices shown]
[im 8/77  bone]
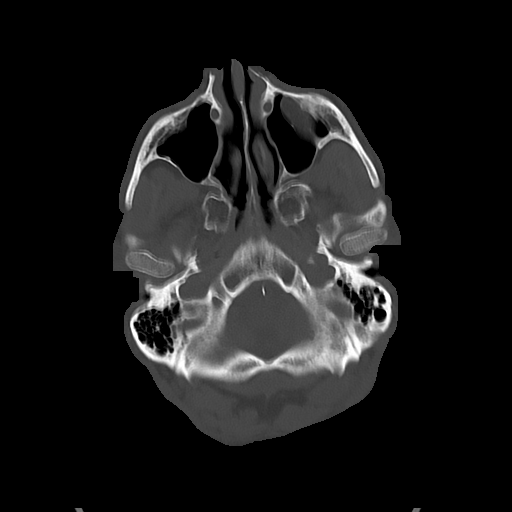
[im 16/77  bone]
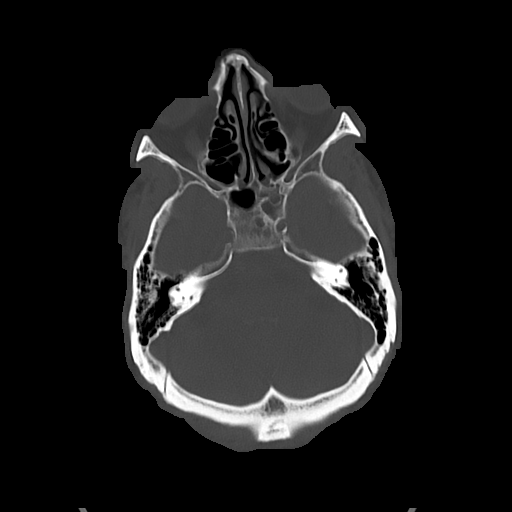
[im 23/77  bone]
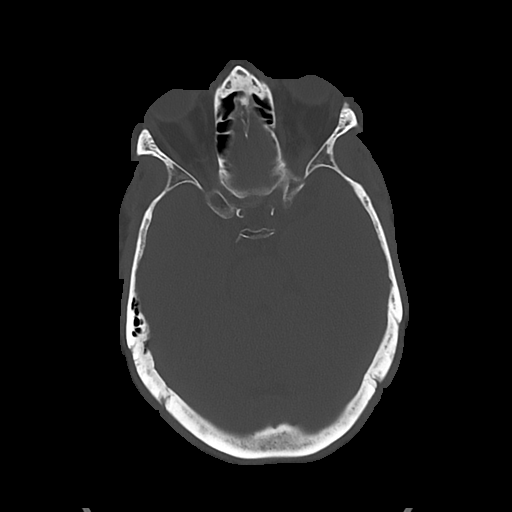

[Series 5: cor soft · coronal · 0.35mm/px · 3 of 76 slices shown]
[im 26/76  brain]
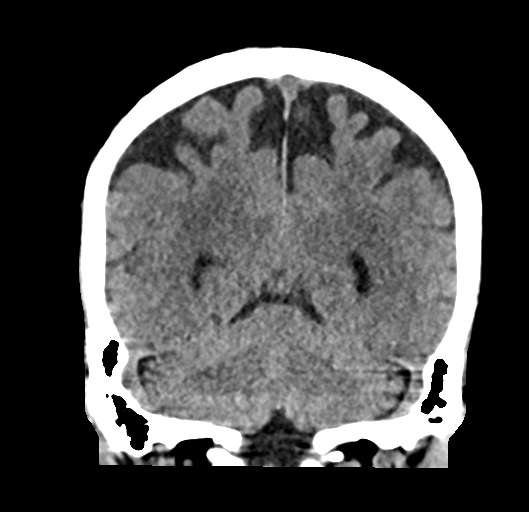
[im 34/76  brain]
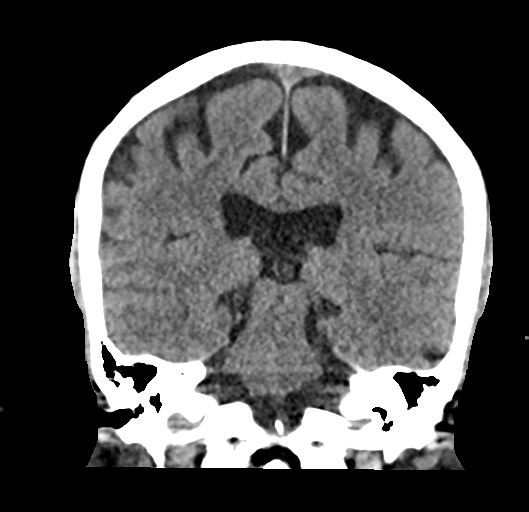
[im 42/76  brain]
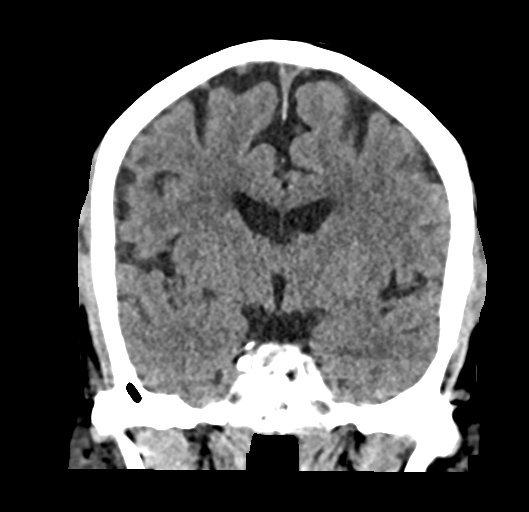

[Series 6: sag soft · sagittal · 0.34mm/px · 3 of 60 slices shown]
[im 20/60  brain]
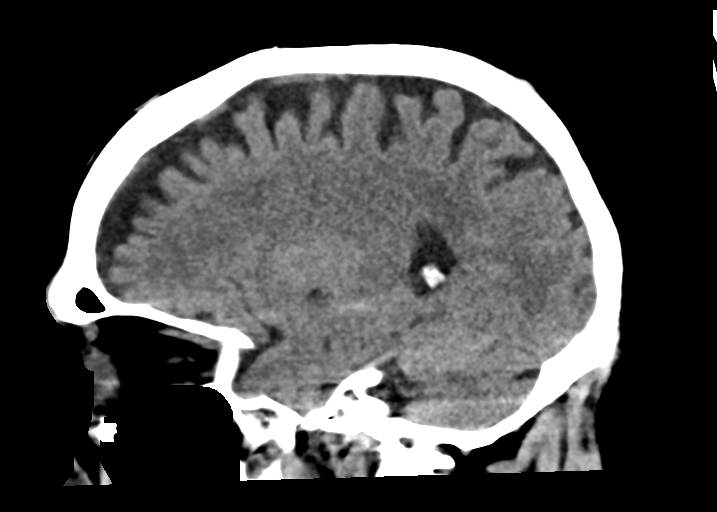
[im 30/60  brain]
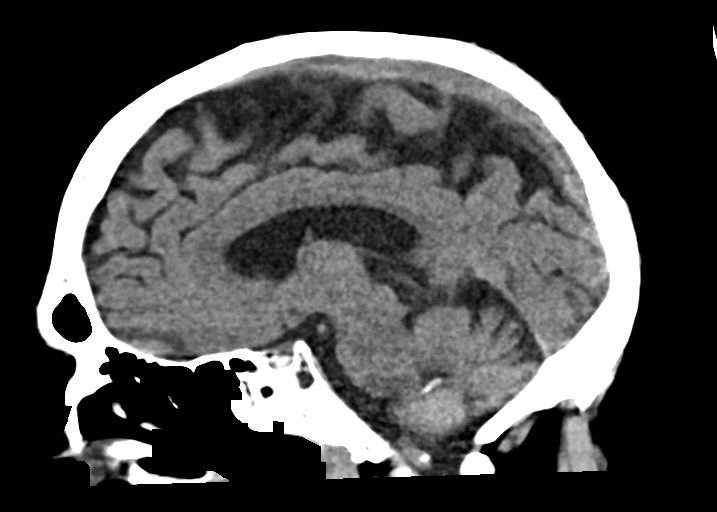
[im 40/60  brain]
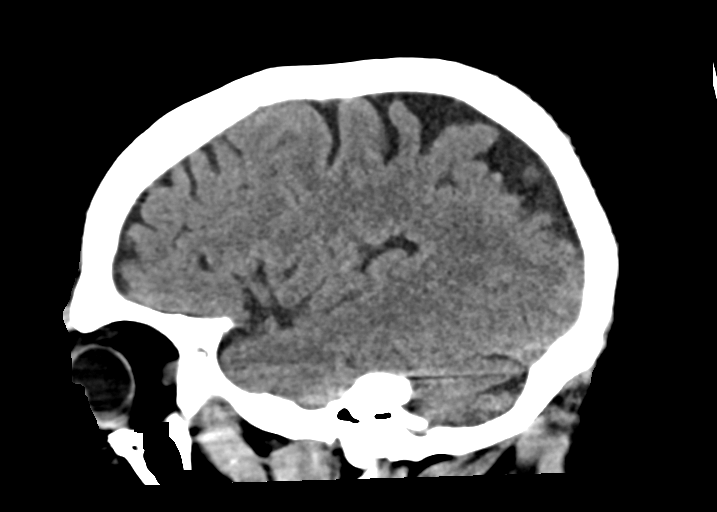

[16 of 47 positions shown; findings below may reference images not displayed]

FINDINGS: Brain: No evidence of acute large vascular territory infarction,
hemorrhage, hydrocephalus, extra-axial collection or mass
lesion/mass effect. Mild patchy white matter hypodensities, most
likely related to chronic microvascular ischemic disease.

Vascular: Calcific atherosclerosis. No hyperdense vessel identified.

Skull: No acute fracture.

Sinuses/Orbits: Visualized sinuses are clear. Remote left medial
orbital wall fracture.

Other: No mastoid effusions.
IMPRESSION: No evidence of acute intracranial abnormality.

## 2020-07-28 IMAGING — DX DG CHEST 1V PORT
1 series · 1 of 1 positions shown · non-contrast
Comparison: [DATE]

CLINICAL DATA: Chest pain x1 day.

EXAM:
PORTABLE CHEST 1 VIEW

[chest]
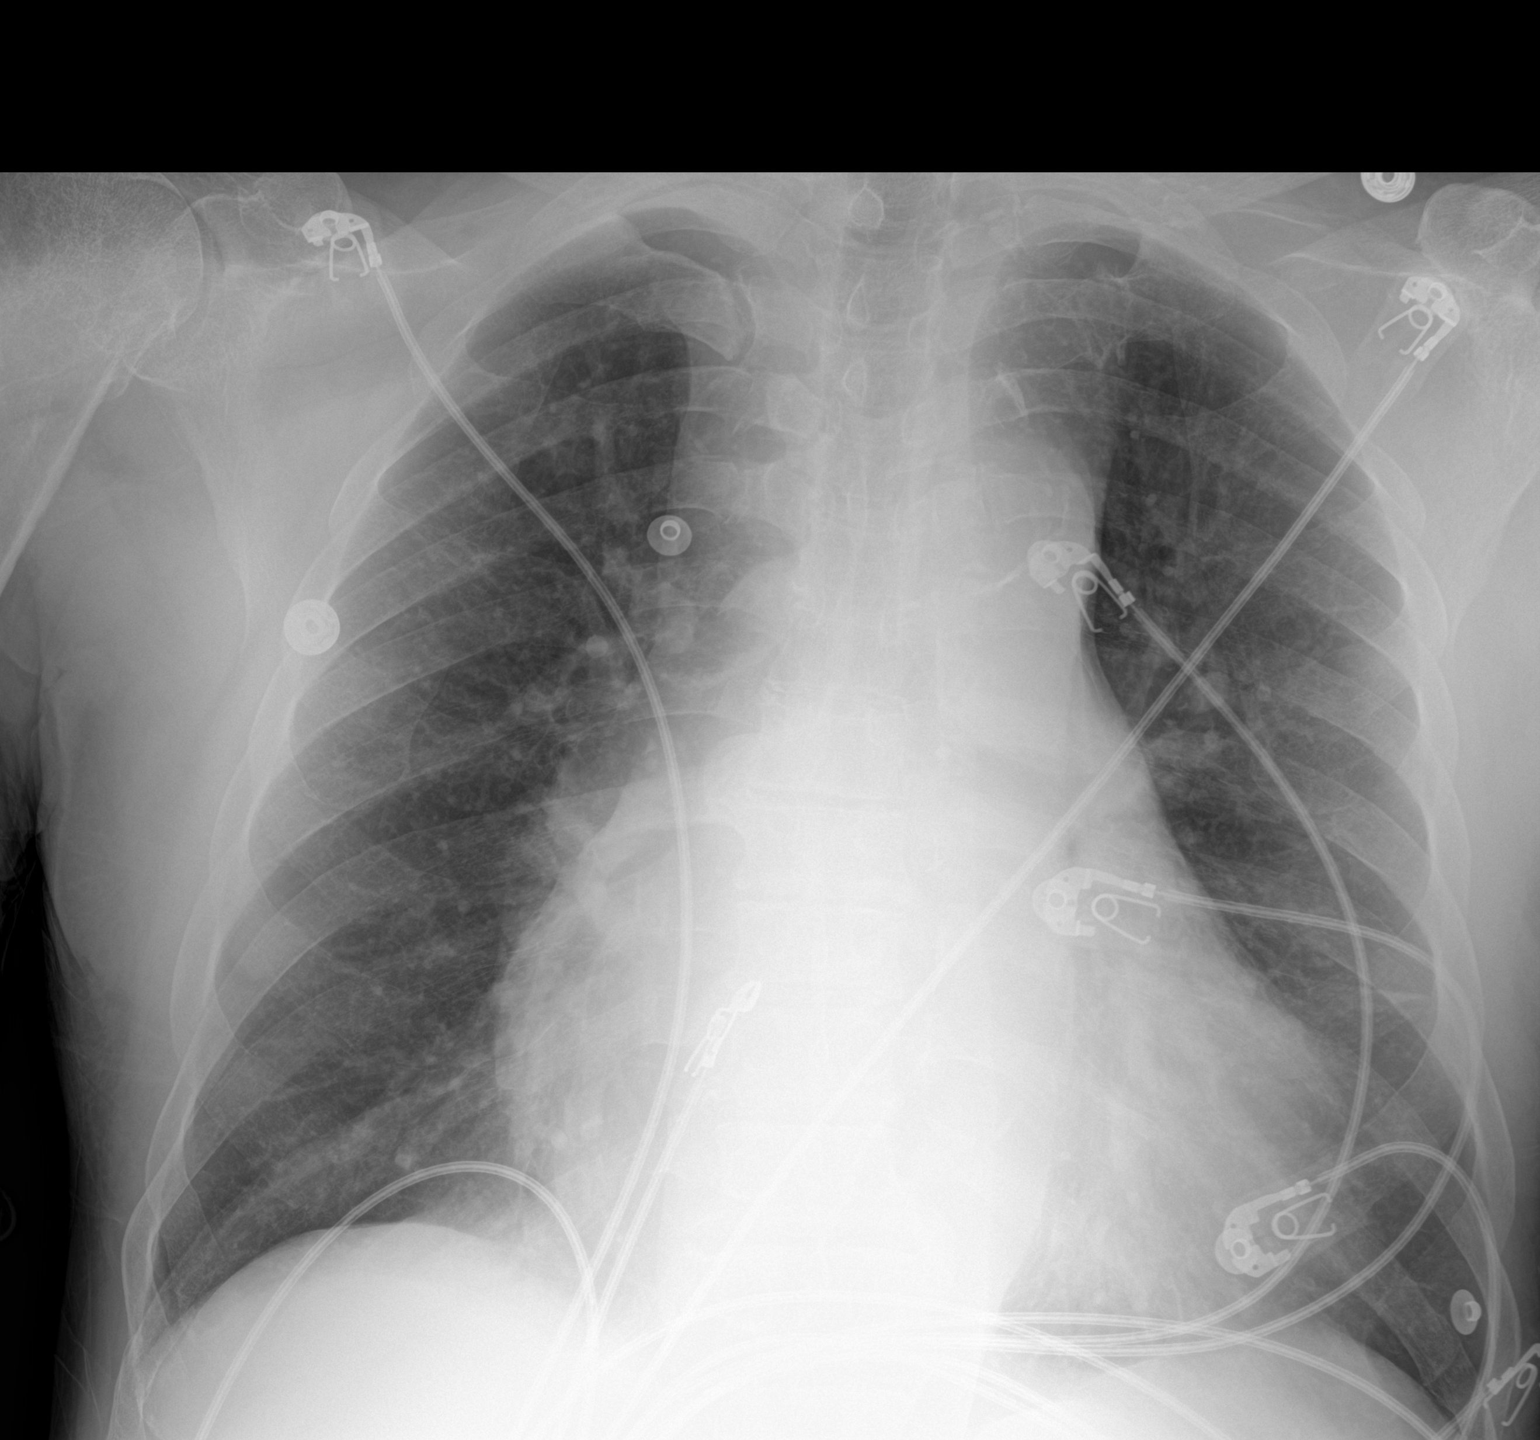

[1 of 1 positions shown; findings below may reference images not displayed]

FINDINGS: The cardiac silhouette is markedly enlarged. Moderate severity
calcification of the aortic arch is seen. Both lungs are clear. The
visualized skeletal structures are unremarkable.
IMPRESSION: Cardiomegaly without evidence of acute or active cardiopulmonary
disease.

## 2020-07-28 MED ORDER — PANTOPRAZOLE SODIUM 40 MG IV SOLR
40.0000 mg | Freq: Once | INTRAVENOUS | Status: AC
  Administered 2020-07-29: 40 mg via INTRAVENOUS

## 2020-07-28 MED ORDER — PANTOPRAZOLE SODIUM 40 MG IV SOLR
40.0000 mg | Freq: Two times a day (BID) | INTRAVENOUS | Status: DC
  Filled 2020-07-28: qty 40

## 2020-07-28 MED ORDER — DEXTROSE 50 % IV SOLN
INTRAVENOUS | Status: AC
  Administered 2020-07-28: 50 mL via INTRAVENOUS
  Filled 2020-07-28: qty 50

## 2020-07-28 MED ORDER — DEXTROSE 50 % IV SOLN: 1.0000 | Freq: Once | INTRAVENOUS | Status: AC

## 2020-07-28 MED ORDER — ACETAMINOPHEN 325 MG PO TABS: 650.0000 mg | ORAL_TABLET | Freq: Four times a day (QID) | ORAL | Status: DC | PRN

## 2020-07-28 MED ORDER — SODIUM CHLORIDE 0.9% IV SOLUTION: Freq: Once | INTRAVENOUS | Status: AC

## 2020-07-28 MED ORDER — NALOXONE HCL 0.4 MG/ML IJ SOLN
0.4000 mg | Freq: Once | INTRAMUSCULAR | Status: AC
  Administered 2020-07-28: 0.4 mg via INTRAVENOUS
  Filled 2020-07-28: qty 1

## 2020-07-28 MED ORDER — AMIODARONE HCL 200 MG PO TABS
200.0000 mg | ORAL_TABLET | Freq: Every day | ORAL | Status: DC
  Administered 2020-07-30 – 2020-08-03 (×4): 200 mg via ORAL
  Filled 2020-07-28 (×5): qty 1

## 2020-07-28 MED ORDER — NALOXONE HCL 4 MG/0.1ML NA LIQD: 1.0000 | Freq: Once | NASAL | Status: DC

## 2020-07-28 MED ORDER — DEXTROSE 50 % IV SOLN: 1.0000 | INTRAVENOUS | Status: DC | PRN

## 2020-07-28 MED ORDER — ACETAMINOPHEN 650 MG RE SUPP: 650.0000 mg | Freq: Four times a day (QID) | RECTAL | Status: DC | PRN

## 2020-07-28 NOTE — ED Notes (Signed)
Dr Roslynn Amble aware of critical labs

## 2020-07-28 NOTE — H&P (Signed)
History and Physical    PLEASE NOTE THAT DRAGON DICTATION SOFTWARE WAS USED IN THE CONSTRUCTION OF THIS NOTE.   Raymond [name] ZOX:[id number] DOB: [birth year] DOA: 07/28/2020  PCP: Raymond Side., FNP Patient coming from: home   I have personally briefly reviewed patient's old medical records in [place]  Chief Complaint: Altered mental status  HPI: Raymond [name] is a 65 y.o. male with medical history significant for paroxysmal atrial flutter currently anticoagulated on Eliquis, peripheral artery disease, end-stage renal disease on hemodialysis on Tuesday, Thursday, Saturday schedule, anemia of chronic kidney disease with baseline hemoglobin of 7.5-9.5, hypertension, COPD, who is admitted to [facility] on 07/28/2020 with suspected acute upper GI bleed after presenting from home to [facility] Emergency Department for evaluation of altered mental status.   Upon arrival at the dialysis center today for his normally scheduled hemodialysis session, the associated staff felt that the patient was more somnolent and confused relative to his baseline mental status.  He reportedly completed 2.5 hours of anticipated 3-hour course of hemodialysis, and in the setting of ongoing confusion/somnolence, with sent directly from the hemodialysis center to [facility] emergency department for further evaluation of altered mental status.  Per my discussions with him, the patient denies any acute complaints at this time.  Upon further discussion, he acknowledges that he has been experiencing 4 to 5 days of dark-colored stool in the absence of any hematochezia.  Denies any associated new onset abdominal pain, nausea, vomiting, or hematemesis.  Denies any recent trauma.  Denies any recent subjective fever, chills, rigors, or generalized myalgias.  No recent diarrhea or rash.  While Pepto-Bismol appears in the patient's home medication list, the patient is able to confirm that he has not taken any doses of this for  the last several months.  He confirms that he is on chronic anticoagulation with Eliquis in the setting of a history of paroxysmal atrial flutter, with most recent dose of such occurring on the morning of 07/28/2020.  He also acknowledges that he is on daily Plavix in the setting of history of peripheral artery disease, with the most recent dose of this medication occurring on the morning of 07/28/2020.  Otherwise he denies any use of additional blood thinning agents as an outpatient, includingaspirin.   Denies any routine consumption of alcohol, also denies any known history of underlying liver disease.  He specifically denies any recent respiratory symptoms, and specifically denies any recent shortness of breath, wheezing, cough.  Also denies any recent headache, neck stiffness, rhinitis, rhinorrhea, sore throat.  No recent travel or known COVID-19 exposures.  Denies any recent chest pain, diaphoresis, palpitations,, dizziness, presyncope, or syncope.  Denies any recent orthopnea, PND, or worsening of peripheral edema.  Per chart review, it appears that the patient has a history of upper gastrointestinal bleed, with EGD/colonoscopy revealing AVM in February 2020.     ED Course:  Vital signs in the ED were notable for the following: Temperature max 98.2; heart rate 108 2015: Blood pressure 117/69 - 150/96; respiratory rate 14-21, oxygen saturation 98 to 100% on room air.  Labs were notable for the following: VBG showed 7.507/36.2.  CMP was notable for the following: Sodium 140, potassium 3.9, albumin 3.1, alkaline phosphatase 54, AST 8 ALT 9, total bilirubin 0.4.  Ammonia found to be 28.  Point-of-care glucose found to be 61, with repeat PSA value improving to 126 following 1 amp of D50.  CBC was notable for the following: Hemoglobin  6.1 relative to most recent prior value of 8.7 on 06/20/2021, platelets 121.  INR 1.5.  High-sensitivity troponin I 47 relative 34 when most recently checked on 01/14/2020,  fecal occult positive.   Chest x-ray showed no evidence of acute cardiopulmonary process.  Noncontrast CT of the head showed no evidence of acute intracranial process.  EKG wave comparison to most recent prior from 06/16/2020 showed atrial flutter with predominant 2-1 block, heart rate 117, nonspecific intraventricular conduction delay, nonspecific T wave inversion in V4 through V6 which appears unchanged relative to most recent prior EKG, nonspecific less than 1 mm ST depression in leads V4 and V5, similar in appearance to most recent prior EKG.    The patient's case was discussed with the on-call gastroenterologist, Dr. Rush Landmark, who agrees to formally consult, and plans to see the patient in the morning for endoscopic evaluation.  Additionally, he recommends initiation of IV Protonix as well as making the patient n.p.o. at midnight.  While in the ED, the following were administered: Transfusion of 1 unit PRBC was initiated.  Protonix 40 mg IV x1.      Review of Systems: As per HPI otherwise 10 point review of systems negative.   Past Medical History:  Diagnosis Date  . Anemia   . CAD (coronary artery disease)    a. 03/2018: cath showing 20 to 30% stenosis along the LAD, luminal irregularities along the RCA, and angiographically normal LCx  . COPD (chronic obstructive pulmonary disease) ([place])   . Dyspnea    "with too much fluid"  . ESRD (end stage renal disease) on dialysis [facility])    "MWF; Jeneen Rinks" (07/22/17)  . Hemodialysis patient ([place])   . Hepatitis C    Still positive s/p liver biopsy at [facility]  and interferon therapy for 6 months. Most recent lab work was on 10/24/12  . Hepatitis C    "took the tx; gone now" (12/05/2016)  Was treated  . History of blood transfusion ~ 2012/2013   "related to my kidneys; blood was low"  . Hypertension   . Peripheral arterial disease ([place])   . Substance abuse ([place])   . Thyroid disease     Past Surgical History:  Procedure Laterality Date  .  ABDOMINAL AORTOGRAM W/LOWER EXTREMITY N/A 02/08/2018   Procedure: ABDOMINAL AORTOGRAM W/LOWER EXTREMITY;  Surgeon: Conrad Bay View, MD;  Location: [place] CV LAB;  Service: Cardiovascular;  Laterality: N/A;  . ABDOMINAL AORTOGRAM W/LOWER EXTREMITY Bilateral 06/15/2020   Procedure: ABDOMINAL AORTOGRAM W/LOWER EXTREMITY;  Surgeon: Waynetta Sandy, MD;  Location: [place] CV LAB;  Service: Cardiovascular;  Laterality: Bilateral;  . AV FISTULA PLACEMENT Left Aug. 2013 ?  [name] BIOPSY  01/17/2020   Procedure: BIOPSY;  Surgeon: Jackquline Denmark, MD;  Location: [facility] ENDOSCOPY;  Service: Endoscopy;;  . COLONOSCOPY    . COLONOSCOPY WITH PROPOFOL N/A 08/21/2018   Procedure: COLONOSCOPY WITH PROPOFOL;  Surgeon: Yetta Flock, MD;  Location: [place];  Service: Gastroenterology;  Laterality: N/A;  . ENTEROSCOPY N/A 01/17/2020   Procedure: ENTEROSCOPY;  Surgeon: Jackquline Denmark, MD;  Location: [facility] ENDOSCOPY;  Service: Endoscopy;  Laterality: N/A;  . ESOPHAGOGASTRODUODENOSCOPY (EGD) WITH PROPOFOL N/A 08/20/2018   Procedure: ESOPHAGOGASTRODUODENOSCOPY (EGD) WITH PROPOFOL;  Surgeon: Gatha Mayer, MD;  Location: [place];  Service: Endoscopy;  Laterality: N/A;  . FEMORAL-POPLITEAL BYPASS GRAFT Left 04/11/2018   Procedure: BYPASS GRAFT FEMORAL-POPLITEAL ARTERY LEFT LEG;  Surgeon: Rosetta Posner, MD;  Location: [place];  Service: Vascular;  Laterality: Left;  .  HOT HEMOSTASIS N/A 08/20/2018   Procedure: HOT HEMOSTASIS (ARGON PLASMA COAGULATION/BICAP);  Surgeon: Gatha Mayer, MD;  Location: [facility] ENDOSCOPY;  Service: Endoscopy;  Laterality: N/A;  . HOT HEMOSTASIS N/A 01/17/2020   Procedure: HOT HEMOSTASIS (ARGON PLASMA COAGULATION/BICAP);  Surgeon: Jackquline Denmark, MD;  Location: [facility] ENDOSCOPY;  Service: Endoscopy;  Laterality: N/A;  . LEFT HEART CATH AND CORONARY ANGIOGRAPHY N/A 03/29/2018   Procedure: LEFT HEART CATH AND CORONARY ANGIOGRAPHY;  Surgeon: Nelva Bush, MD;  Location: [place] CV LAB;   Service: Cardiovascular;  Laterality: N/A;  . LIVER BIOPSY    . ORIF ULNAR FRACTURE Left 05/18/2017   Procedure: OPEN REDUCTION INTERNAL FIXATION (ORIF) ULNAR FRACTURE;  Surgeon: Iran Planas, MD;  Location: [place];  Service: Orthopedics;  Laterality: Left;  . PERIPHERAL VASCULAR INTERVENTION Right 06/15/2020   Procedure: PERIPHERAL VASCULAR INTERVENTION;  Surgeon: Waynetta Sandy, MD;  Location: [place] CV LAB;  Service: Cardiovascular;  Laterality: Right;  . POLYPECTOMY  08/21/2018   Procedure: POLYPECTOMY;  Surgeon: Yetta Flock, MD;  Location: [facility] ENDOSCOPY;  Service: Gastroenterology;;  . SHUNTOGRAM N/A 12/11/2012   Procedure: Earney Mallet;  Surgeon: Serafina Mitchell, MD;  Location: [facility] CATH LAB;  Service: Cardiovascular;  Laterality: N/A;  . WOUND EXPLORATION Left 06/15/2020   Procedure: GROIN  EXPLORATION;  Surgeon: Waynetta Sandy, MD;  Location: [place];  Service: Vascular;  Laterality: Left;    Social History:  reports that he has been smoking cigarettes. He has a 12.50 pack-year smoking history. He has never used smokeless tobacco. He reports current alcohol use. He reports previous drug use. Drug: Cocaine.   No Known Allergies  Family History  Problem Relation Age of Onset  . Diabetes Father   . Hypertension Father   . Heart disease Father      Prior to Admission medications   Medication Sig Start Date End Date Taking? Authorizing Provider  albuterol (VENTOLIN HFA) 108 (90 Base) MCG/ACT inhaler Inhale 2 puffs into the lungs every 4 (four) hours as needed for shortness of breath.  11/06/19   [provider]  amiodarone (PACERONE) 200 MG tablet Take 1 tablet (200 mg total) by mouth daily. Take two tablets once a day for 7 days. Starting on 06/30/20 take only one tablet daily 06/30/20   Baglia, Corrina, PA-C  amLODipine (NORVASC) 2.5 MG tablet Take 2.5 mg by mouth at bedtime.  01/01/20   [provider]  apixaban (ELIQUIS) 5 MG TABS tablet  Take 1 tablet (5 mg total) by mouth 2 (two) times daily. 06/22/20   Baglia, Corrina, PA-C  bismuth subsalicylate (PEPTO BISMOL) 262 MG chewable tablet Chew 524 mg by mouth as needed for indigestion or diarrhea or loose stools.    [provider]  clopidogrel (PLAVIX) 75 MG tablet Take 1 tablet (75 mg total) by mouth daily. 06/23/20   Baglia, Corrina, PA-C  oxyCODONE-acetaminophen (PERCOCET) 5-325 MG tablet Take 1 tablet by mouth every 6 (six) hours as needed for severe pain. 07/01/20 07/01/21  Gabriel Earing, PA-C  pantoprazole (PROTONIX) 40 MG tablet Take 1 tablet (40 mg total) by mouth daily. 01/18/20   Ghimire, Henreitta Leber, MD  QUEtiapine (SEROQUEL) 100 MG tablet Take 100 mg by mouth at bedtime as needed (sleep).  01/01/20   [provider]  rosuvastatin (CRESTOR) 10 MG tablet Take 1 tablet (10 mg total) by mouth daily. 06/23/20   Baglia, Corrina, PA-C  sevelamer carbonate (RENVELA) 800 MG tablet Take 4 tablets (3,200 mg total) by mouth  3 (three) times daily with meals. Patient taking differently: Take 4,000 mg by mouth See admin instructions. Take 4000 mg with each meal and snack 09/14/18   Monica Becton, MD     Objective    Physical Exam: Vitals:   07/28/20 1800 07/28/20 1832 07/28/20 1854 07/28/20 1959  BP: (!) 131/98 (!) 131/98 (!) 150/96 (!) 152/89  Pulse: (!) 118 (!) 108 (!) 110 (!) 112  Resp: _0 Temp:  98.2 F (36.8 C) 98.2 F (36.8 C)   TempSrc:  Axillary Axillary   SpO2: 100% 100% 100% 100%  Weight:      Height:        General: appears to be stated age; alert, oriented to time, self, and place, but not oriented to person (unsure of the name of the current [place] president) Skin: warm, dry, no rash Head:  AT/[place] Mouth:  Oral mucosa membranes appear moist, normal dentition Neck: supple; trachea midline Heart:  RRR; did not appreciate any M/R/G Lungs: CTAB, did not appreciate any wheezes, rales, or rhonchi Abdomen: + BS; soft, ND,  NT Vascular: 2+ pedal pulses b/l; 2+ radial pulses b/l Extremities: no peripheral edema, no muscle wasting Neuro: strength and sensation intact in upper and lower extremities b/l   Labs on Admission: I have personally reviewed following labs and imaging studies  CBC: Recent Labs  Lab 07/28/20 1539 07/28/20 1624 07/28/20 1837  WBC 1.4*  --   --   NEUTROABS 1.1*  --   --   HGB 5.6* 5.8* 6.1*  HCT 17.4* 17.0* 18.0*  MCV 107.4*  --   --   PLT 121*  --   --    Basic Metabolic Panel: Recent Labs  Lab 07/28/20 1539 07/28/20 1624 07/28/20 1837  NA 141 141 140  K 3.6 3.8 3.9  CL 96*  --  97*  CO2 27  --   --   GLUCOSE 77  --  72  BUN 67*  --  72*  CREATININE 11.53*  --  12.60*  CALCIUM 8.6*  --   --    GFR: Estimated Creatinine Clearance: 5.6 mL/min (A) (by C-G formula based on SCr of 12.6 mg/dL (H)). Liver Function Tests: Recent Labs  Lab 07/28/20 1539  AST 8*  ALT 9  ALKPHOS 54  BILITOT 0.4  PROT 6.9  ALBUMIN 3.1*   No results for input(s): LIPASE, AMYLASE in the last 168 hours. Recent Labs  Lab 07/28/20 1539  AMMONIA 28   Coagulation Profile: Recent Labs  Lab 07/28/20 1710  INR 1.5*   Cardiac Enzymes: No results for input(s): CKTOTAL, CKMB, CKMBINDEX, TROPONINI in the last 168 hours. BNP (last 3 results) No results for input(s): PROBNP in the last 8760 hours. HbA1C: No results for input(s): HGBA1C in the last 72 hours. CBG: Recent Labs  Lab 07/28/20 1527 07/28/20 1601  GLUCAP 61* 126*   Lipid Profile: No results for input(s): CHOL, HDL, LDLCALC, TRIG, CHOLHDL, LDLDIRECT in the last 72 hours. Thyroid Function Tests: No results for input(s): TSH, T4TOTAL, FREET4, T3FREE, THYROIDAB in the last 72 hours. Anemia Panel: No results for input(s): VITAMINB12, FOLATE, FERRITIN, TIBC, IRON, RETICCTPCT in the last 72 hours. Urine analysis:    Component Value Date/Time   COLORURINE YELLOW 05/08/2010 [place] 05/08/2010 1335   LABSPEC  1.009 05/08/2010 1335   PHURINE 6.0 05/08/2010 1335   GLUCOSEU NEGATIVE 05/08/2010 1335   HGBUR NEGATIVE 05/08/2010 1335   [place] 05/08/2010  [place] 05/08/2010 1335   PROTEINUR 100 (A) 05/08/2010 1335   UROBILINOGEN 0.2 05/08/2010 1335   NITRITE NEGATIVE 05/08/2010 1335   LEUKOCYTESUR SMALL (A) 05/08/2010 1335    Radiological Exams on Admission: CT Head Wo Contrast  Result Date: 07/28/2020 CLINICAL DATA:  Delirium. EXAM: CT HEAD WITHOUT CONTRAST TECHNIQUE: Contiguous axial images were obtained from the base of the skull through the vertex without intravenous contrast. COMPARISON:  CT head May 28, 2019 FINDINGS: Brain: No evidence of acute large vascular territory infarction, hemorrhage, hydrocephalus, extra-axial collection or mass lesion/mass effect. Mild patchy white matter hypodensities, most likely related to chronic microvascular ischemic disease. Vascular: Calcific atherosclerosis. No hyperdense vessel identified. Skull: No acute fracture. Sinuses/Orbits: Visualized sinuses are clear. Remote left medial orbital wall fracture. Other: No mastoid effusions. IMPRESSION: No evidence of acute intracranial abnormality. Electronically Signed   By: Margaretha Sheffield MD   On: 07/28/2020 16:03   DG Chest Portable 1 View  Result Date: 07/28/2020 CLINICAL DATA:  Chest pain x1 day. EXAM: PORTABLE CHEST 1 VIEW COMPARISON:  June 20, 2020 FINDINGS: The cardiac silhouette is markedly enlarged. Moderate severity calcification of the aortic arch is seen. Both lungs are clear. The visualized skeletal structures are unremarkable. IMPRESSION: Cardiomegaly without evidence of acute or active cardiopulmonary disease. Electronically Signed   By: Virgina Norfolk M.D.   On: 07/28/2020 16:32     EKG: Independently reviewed, with result as described above.    Assessment/Plan   JERIMEY BURRIDGE is a 65 y.o. male with medical history significant for paroxysmal atrial flutter  currently anticoagulated on Eliquis, peripheral artery disease, end-stage renal disease on hemodialysis on Tuesday, Thursday, Saturday schedule, anemia of chronic kidney disease with baseline hemoglobin of 7.5-9.5, hypertension, COPD, who is admitted to [facility] on 07/28/2020 with suspected acute upper GI bleed after presenting from home to [facility] Emergency Department for evaluation of altered mental status.     Principal Problem:   Acute on chronic anemia Active Problems:   ESRD (end stage renal disease) (HCC)   Hypoglycemia   Acute upper GI bleed   Acute metabolic encephalopathy   COVID-19 virus infection      #) Acute Upper GI Bleed: diagnosis on the basis of 4 to 5 days new onset dark-colored stool, with decline in hemoglobin to 6.1 relative to baseline ranges 7.5 to 9.5, all in the context of a documented history of upper gastrointestinal bleed in February 2020 at which time EGD reportedly revealed AVM.  Of note, presenting BUN is elevated, although this appears to represent a good bowel numerically given concomitant history of end-stage renal disease on hemodialysis.  Chronically anticoagulated on Eliquis the setting of paroxysmal atrial flutter, as above.  This is in addition to daily Plavix in the setting of PAD.  Denies any recent NSAID use.  Patient also denies any known history of underlying liver disease and denies any routine alcohol consumption.  Differential at this time includes peptic ulcer disease, gastritis, AVM.  In the absence of known liver disease, initiation of SBP prophylaxis does not appear to be warranted at this time.  Presentation and history are less suggestive of variceal bleed, never there does not appear to be an indication for octreotide.  At this time, the patient appears hemodynamically stable, with normotensive to mildly hypertensive blood pressures, with mild tachycardia improving over the course of transfusion of 1 unit PRBC.  Additionally, the patient  appears completely asymptomatic at this time. Dr. Rush Landmark of  [facility] gastroenterology has been formally consulted, and plans to evaluate the patient in the morning for endoscopic evaluation, with interval recommendations to make the patient n.p.o. at midnight and to start IV PPI.     Plan: NPO at MN, as above.  We will hold Eliquis and Plavix for now.  Avoid additional pharmacologic DVT prophylaxis for now.  SCDs.  Monitor on telemetry.  Monitor continuous pulse oximetry.  Maintain at least 2 large-bore IVs.  Repeat INR in the morning.  Will repeat H&H 30 minutes following completion of 1 unit PRBC has been initiated in the ED this evening.  Subsequent to that I have ordered every 4 hour H&H trending through 9 AM on the morning of 07/29/2020.  Also repeat CBC with draw morning's labs.  Closely monitoring hemoglobin levels and correlate these data points with patient's overall clinical picture including vital sign evaluation to determine need for additional PRBC transfusion.  Protonix 40 mg IV twice daily, per GI recommendations.  Repeat CMP in the morning.  Additional evaluation and management of associated presenting acute on chronic anemia, as further described below.      #) Acute on chronic anemia: In the setting of anemia of chronic kidney disease with associated baseline hemoglobin of 7.5-9.5, patient presents with hemoglobin of 6.1, felt to be the basis of acute blood loss anemia due to suspected acute upper gastrointestinal bleed, as further described above.  He is currently receiving transfusion 1 unit of PRBC, as above, and gastroenterology has been consulted for endoscopic evaluation to occur tomorrow morning, as further described above.   Plan: work-up and management for presenting suspected acute upper GI bleed, as above, including close monitoring of Q4H H&H's, with clinical evaluation for determination of need for blood transfusion, as further described above. Monitor on telemetry.  Monitor continuous pulse-ox. NPO at MN. Refraining from pharmacologic DVT prophylaxis. Check INR in the morning.  Add on the following labs to pretransfusion specimen: Total iron, ferritin, TIBC, B04 level, folic acid level, reticulocyte count, and peripheral smear.  Gastroenterology consult, as above.     #) Acute metabolic encephalopathy: 1 day of reported increased somnolence and confusion relative to baseline.  This is improved following initiation of transfusion of 1 unit PRBC, suggesting a strong contribution to presenting acute encephalopathy from diminished oxygen carrying capacity as well as diminished oxygen delivery in the setting of acute on chronic anemia, as further described above.  No other obvious source metabolic encephalopathy at this time.  Acute stroke clinically appears less likely at this time, with presenting CT head showing no evidence of acute intracranial process.  No evidence of seizures.  Urinalysis has not been ordered as the patient is anuric.  Chest x-ray shows no evidence of acute cardiopulmonary process.  Screening COVID-19 test performed in the ED today was found to be positive, although the patient is completely asymptomatic from a respiratory standpoint, and given significant improvement in his mental status following initiation of therapy see transfusion, there is diminished clinical index of suspicion for strong contribution from COVID-19 positive finding relative to presenting acute encephalopathy.  There may also have been a small contribution to presenting acute encephalopathy in the setting of hypoglycemia, with PAC glucose noted to be 61, which is improved to 126 following administration of 1 amp of D50.  Plan: Were not management acute on chronic anemia in the setting of suspected acute upper Bleed, further described above.  Repeat CMP in the morning.  Check TSH.  Further monitoring of blood  sugar and management of presenting hypoglycemia, as further detailed  below.     #) positive COVID-19 result: routine screening COVID-19 testing performed in the emergency department sooner soundwave positive.  Chest x-ray shows no evidence of acute cardiopulmonary process.  This result is in the context of no recent respiratory symptoms and no additional recent symptoms felt to be consistent with COVID-19 infection. Of note, this positive COVID-19 finding does not represent the reason for admission. Presentation does not appear to be associated with acute hypoxic respiratory distress/failure, with patient maintaining O2 sats greater than 94% on room air. Consequently, there does not appear to be an indication at this time for initiation of dexamethasone per treatment guidance recommendations from [facility]'s Covid Treatment Guidelines. As the patient is being admitted for a completely different reason relative to the positive COVID-19 screening result, and as this patient appears asymptomatic from a COVID-19 standpoint, criteria do not appear to be met for initiation of a 3 day course of remdesivir at this time per treatment guidance recommendations from [facility]'s Covid Treatment Guidelines. Will closely monitor ensuing clinical trend, including monitoring for development of respiratory symptoms as well as evidence of acute hypoxic respiratory distress, while providing interval supportive care, as further described below. Denies any known or suspected COVID-19 exposures.  Has received Moderna COVID-19 vaccine x2 doses as well as Pfizer booster in October 2021.    Plan: Airborne and contact precautions. Monitor continuous pulse oximetry, with close monitoring for development of new supplemental oxygen requirement, as above. prn supplemental O2 to maintain O2 sats greater than or equal to 94%. Refraining from initiation of dexamethasone and remdesivir, as above. Check inflammatory markers (fibrinogen, d dimer or fibrin derivatives, crp, ferritin, LDH).  Add on  procalcitonin level.       #) Elevated troponin: In the context of history of chronically elevated troponin I, presenting sensitivity troponin I is found to be mildly elevated at 47 relative to his recent prior value of 36 on 01/14/2020.  Suspect that this is mildly elevated troponin level relative to most recent priors on the basis of supply demand mismatch in the setting diminished oxygen capacity as well as diminished oxygen delivery capacity in setting of acute on chronic anemia, as further detailed above as opposed to representing a type I process due to acute plaque rupture.  EKG in comparison to most recent prior in December 2021 shows no evidence of acute ischemic changes, as further detailed above, including no evidence of ST elevation, while chest x-ray shows no evidence of acute cardiopulmonary process.  Additionally, the patient is a recent chest pain.  Overall, ACS felt to be less likely relative to a type II supply demand mismatch, as above, but will closely monitor on telemetry overnight while treating suspected underlying acute on chronic anemia, as further described above.  Additionally presentation is clinically not suggestive of acute pulmonary embolism.    Plan: repeat troponin in the AM. Monitor on telemetry. PRN EKG for development of chest pain. Check serum Mg level and repeat BMP in the morning.  Monitor continuous pulse oximetry.  Initial evaluation and management of presenting acute on chronic anemia in setting of suspected acute upper gastrointestinal bleed, as further described above, including trending of serial H&H values.       #) Hypoglycemia: In the context of no documented history of diabetes, no outpatient insulin use, and no known patient oral hypoglycemic use, presenting blood sugar is noted to be 61, which is subsequently improved to  126 volume ministration of 1 amp of D50.  Given concomitant presenting acute encephalopathy, will closely monitor in setting blood  sugar via serial POC glucose values, as further quantified below, along with initiation of as needed D50.  Plan: Accu-Cheks q. 2 hours x 6 occurrences.  D50 1 amp IV every 2 hours as needed for blood sugar less than 70.  Check hemoglobin A1c.      #) end-stage renal disease: On hemodialysis on a Tuesday, Thursday, Saturday schedule, with patient under going most recent hemodialysis session earlier today, as further detailed above.  He is on Renvela as an outpatient.  No evidence to suggest acute volume overload at this time, while presenting potassium found to be 3.9.   Plan: Monitor strict I's and O's and daily weights.  Avoid nephrotoxic agents.  Repeat CMP in the morning.  We will also check serum magnesium and phosphorus levels.  If the patient remains in the hospital leading up to Thursday, would need to contact nephrology to arrange for assistance with hemodialysis while still in the hospital.     #)Hypertension: Outpatient antihypertensive regimen is limited to Norvasc.  In the context of presenting suspected acute gastrointestinal bleed, will hold Norvasc for now.  Plan: Hold home Norvasc for now, as above.  Close monitoring of ensuing blood pressure today routine vital signs.     #) Paroxysmal atrial flutter: Chronically anticoagulated on Eliquis: Presenting EKG appears to reflect atrial flutter with ventricular rate of 117.  Rhythm control strategy is pursued as an outpatient, with the patient on amiodarone.  Not on any additional AV nodal blocking agents.  In the setting of presenting suspected acute upper gastrointestinal bleed, will hold Eliquis for now.   Plan: Hold home Eliquis for now, as above.  Monitor on telemetry.  Add on serum magnesium level, with plan to repeat this level in the morning as well.  Repeat CMP in the morning.  Continue outpatient amiodarone.      #) Chronic tobacco abuse: The patient knowledges that he is a current smoker, and has smoked  approximately half pack per day over the last 25 years, although he reports that he has recently been able to reduce his daily smoking to less than half pack per day.  Plan: Counseled the patient on the importance of complete smoking discontinuation.     DVT prophylaxis: scd's  Code Status: Full code Family Communication: none Disposition Plan: Per Rounding Team Consults called: Case was discussed with the on-call gastroenterologist, Dr. Rush Landmark, as further described above Admission status: Inpatient; admit telemetry.     Of note, this patient was added by me to the following Admit List/Treatment Team:  mcadmits     PLEASE NOTE THAT DRAGON DICTATION SOFTWARE WAS USED IN THE CONSTRUCTION OF THIS NOTE.   Rhetta Mura DO [facility] Pager [phone] From [place]  07/28/2020, 8:26 PM

## 2020-07-28 NOTE — ED Notes (Signed)
Patient unable to tell VAST RN how long pressure needs to be applied to his fistula to stop bleeding. After 20 mins, arterial site of AVF continued to bleed; total time pressure held = 45 mins.

## 2020-07-28 NOTE — ED Notes (Signed)
This RN contacted pts sister Aedon Deason, update given to sister and verbal consent given to this RN over the phone for blood administration due to pts AMS and inability to consent

## 2020-07-28 NOTE — ED Provider Notes (Addendum)
[name] EMERGENCY DEPARTMENT Provider Note   CSN: [id number] Arrival date & time: 07/28/20  1518     History Chief Complaint  Patient presents with  . Altered Mental Status    Raymond [name] is a 65 y.o. male.  HPI   pt Is a 65 year old male with a history of anemia, COPD, CAD, ESRD on dialysis, hep C, hypertension, PAD, substance abuse, who presents to the emergency department today for evaluation of altered mental status.  Patient was sent from his dialysis center.  He got 2.5 out of his 3 hours but they noted that he became progressively more altered while he was there so sent him here.  I personally spoke with the dialysis center and they stated that he was verbal on arrival but seemed altered and that this worsened.  They state that this is not abnormal for him and that he has a history of substance abuse.  They also note that he has missed several dialysis sessions over the last 2 weeks.  There is a level 5 caveat as patient is altered on my initial evaluation.  Past Medical History:  Diagnosis Date  . Anemia   . CAD (coronary artery disease)    a. 03/2018: cath showing 20 to 30% stenosis along the LAD, luminal irregularities along the RCA, and angiographically normal LCx  . COPD (chronic obstructive pulmonary disease) ([place])   . Dyspnea    "with too much fluid"  . ESRD (end stage renal disease) on dialysis [facility])    "MWF; [facility]" (07/22/17)  . Hemodialysis patient ([place])   . Hepatitis C    Still positive s/p liver biopsy at [facility]  and interferon therapy for 6 months. Most recent lab work was on 10/24/12  . Hepatitis C    "took the tx; gone now" (12/05/2016)  Was treated  . History of blood transfusion ~ 2012/2013   "related to my kidneys; blood was low"  . Hypertension   . Peripheral arterial disease ([place])   . Substance abuse ([place])   . Thyroid disease     Patient Active Problem List   Diagnosis Date Noted  . History of arteriovenous  malformation (AVM) 01/14/2020  . UGIB (upper gastrointestinal bleed) 01/14/2020  . Acute blood loss anemia 01/14/2020  . Elevated troponin 01/14/2020  . Gastroenteritis 12/24/2018  . Substance use disorder 12/24/2018  . Noncompliance of patient with renal dialysis ([place]) 09/12/2018  . Benign neoplasm of colon   . Melena   . Anemia due to chronic kidney disease, on chronic dialysis ([place])   . Acute on chronic anemia   . H/O medication noncompliance   . Polysubstance (excluding opioids) dependence ([place])   . Atrial flutter with rapid ventricular response ([place]) 08/10/2018  . PAF (paroxysmal atrial fibrillation) ([place]) 08/01/2018  . Acute respiratory failure with hypoxia ([place]) 07/13/2018  . Non-compliance with renal dialysis ([place]) 07/13/2018  . PAD (peripheral artery disease) ([place]) 04/11/2018  . Sepsis ([place]) 03/31/2018  . Preop cardiovascular exam 03/29/2018  . Volume overload 08/07/2017  . Acute pulmonary edema (H[facility])   . Dyspnea 06/20/2017  . Acute bronchitis   . COPD (chronic obstructive pulmonary disease) ([place])   . End-stage renal disease on hemodialysis ([place])   . Hypoxia 12/05/2016  . Hyperkalemia 12/19/2015  . Hypoglycemia 12/19/2015  . Polysubstance abuse ([place]) 12/19/2015  . Homeless 12/19/2015  . Anemia associated with stage 5 chronic renal failure ([place]) 12/19/2015  . Atypical chest pain 12/19/2013  . Atherosclerosis of native  arteries of extremity with intermittent claudication ([place]) 12/04/2012  . ESRD (end stage renal disease) ([place]) 12/04/2012  . HEPATITIS C 09/07/2006  . HYPERCHOLESTEROLEMIA 09/07/2006  . HYPERTENSION, BENIGN SYSTEMIC 09/07/2006    Past Surgical History:  Procedure Laterality Date  . ABDOMINAL AORTOGRAM W/LOWER EXTREMITY N/A 02/08/2018   Procedure: ABDOMINAL AORTOGRAM W/LOWER EXTREMITY;  Surgeon: Conrad Tilden, MD;  Location: [place] [facility] CV LAB;  Service: Cardiovascular;  Laterality: N/A;  . ABDOMINAL AORTOGRAM W/LOWER EXTREMITY Bilateral 06/15/2020    Procedure: ABDOMINAL AORTOGRAM W/LOWER EXTREMITY;  Surgeon: Waynetta Sandy, MD;  Location: [place] CV LAB;  Service: Cardiovascular;  Laterality: Bilateral;  . AV FISTULA PLACEMENT Left Aug. 2013 ?  [name] BIOPSY  01/17/2020   Procedure: BIOPSY;  Surgeon: Jackquline Denmark, MD;  Location: [facility] ENDOSCOPY;  Service: Endoscopy;;  . COLONOSCOPY    . COLONOSCOPY WITH PROPOFOL N/A 08/21/2018   Procedure: COLONOSCOPY WITH PROPOFOL;  Surgeon: Yetta Flock, MD;  Location: [place];  Service: Gastroenterology;  Laterality: N/A;  . ENTEROSCOPY N/A 01/17/2020   Procedure: ENTEROSCOPY;  Surgeon: Jackquline Denmark, MD;  Location: [facility] ENDOSCOPY;  Service: Endoscopy;  Laterality: N/A;  . ESOPHAGOGASTRODUODENOSCOPY (EGD) WITH PROPOFOL N/A 08/20/2018   Procedure: ESOPHAGOGASTRODUODENOSCOPY (EGD) WITH PROPOFOL;  Surgeon: Gatha Mayer, MD;  Location: [facility];  Service: Endoscopy;  Laterality: N/A;  . FEMORAL-POPLITEAL BYPASS GRAFT Left 04/11/2018   Procedure: BYPASS GRAFT FEMORAL-POPLITEAL ARTERY LEFT LEG;  Surgeon: Rosetta Posner, MD;  Location: [facility];  Service: Vascular;  Laterality: Left;  . HOT HEMOSTASIS N/A 08/20/2018   Procedure: HOT HEMOSTASIS (ARGON PLASMA COAGULATION/BICAP);  Surgeon: Gatha Mayer, MD;  Location: [facility] ENDOSCOPY;  Service: Endoscopy;  Laterality: N/A;  . HOT HEMOSTASIS N/A 01/17/2020   Procedure: HOT HEMOSTASIS (ARGON PLASMA COAGULATION/BICAP);  Surgeon: Jackquline Denmark, MD;  Location: [facility] ENDOSCOPY;  Service: Endoscopy;  Laterality: N/A;  . LEFT HEART CATH AND CORONARY ANGIOGRAPHY N/A 03/29/2018   Procedure: LEFT HEART CATH AND CORONARY ANGIOGRAPHY;  Surgeon: Nelva Bush, MD;  Location: [place] CV LAB;  Service: Cardiovascular;  Laterality: N/A;  . LIVER BIOPSY    . ORIF ULNAR FRACTURE Left 05/18/2017   Procedure: OPEN REDUCTION INTERNAL FIXATION (ORIF) ULNAR FRACTURE;  Surgeon: Iran Planas, MD;  Location: [facility];  Service: Orthopedics;  Laterality: Left;  . PERIPHERAL  VASCULAR INTERVENTION Right 06/15/2020   Procedure: PERIPHERAL VASCULAR INTERVENTION;  Surgeon: Waynetta Sandy, MD;  Location: [place] CV LAB;  Service: Cardiovascular;  Laterality: Right;  . POLYPECTOMY  08/21/2018   Procedure: POLYPECTOMY;  Surgeon: Yetta Flock, MD;  Location: [facility] ENDOSCOPY;  Service: Gastroenterology;;  . SHUNTOGRAM N/A 12/11/2012   Procedure: Earney Mallet;  Surgeon: Serafina Mitchell, MD;  Location: [facility] CATH LAB;  Service: Cardiovascular;  Laterality: N/A;  . WOUND EXPLORATION Left 06/15/2020   Procedure: GROIN  EXPLORATION;  Surgeon: Waynetta Sandy, MD;  Location: [facility] OR;  Service: Vascular;  Laterality: Left;       Family History  Problem Relation Age of Onset  . Diabetes Father   . Hypertension Father   . Heart disease Father     Social History   Tobacco Use  . Smoking status: Current Every Day Smoker    Packs/day: 0.50    Years: 25.00    Pack years: 12.50    Types: Cigarettes    Last attempt to quit: 08/01/2011    Years since quitting: 8.9  . Smokeless tobacco: Never Used  . Tobacco comment: he smokes 6 cigarettes a week  Vaping Use  . Vaping Use: Never used  Substance Use Topics  . Alcohol use: Yes    Comment: occasionally/ one half pint  . Drug use: Not Currently    Types: Cocaine    Home Medications Prior to Admission medications   Medication Sig Start Date End Date Taking? Authorizing Provider  albuterol (VENTOLIN HFA) 108 (90 Base) MCG/ACT inhaler Inhale 2 puffs into the lungs every 4 (four) hours as needed for shortness of breath.  11/06/19   [provider]  amiodarone (PACERONE) 200 MG tablet Take 1 tablet (200 mg total) by mouth daily. Take two tablets once a day for 7 days. Starting on 06/30/20 take only one tablet daily 06/30/20   Baglia, Corrina, PA-C  amLODipine (NORVASC) 2.5 MG tablet Take 2.5 mg by mouth at bedtime.  01/01/20   [provider]  apixaban (ELIQUIS) 5 MG TABS tablet Take 1 tablet  (5 mg total) by mouth 2 (two) times daily. 06/22/20   Baglia, Corrina, PA-C  bismuth subsalicylate (PEPTO BISMOL) 262 MG chewable tablet Chew 524 mg by mouth as needed for indigestion or diarrhea or loose stools.    [provider]  clopidogrel (PLAVIX) 75 MG tablet Take 1 tablet (75 mg total) by mouth daily. 06/23/20   Baglia, Corrina, PA-C  oxyCODONE-acetaminophen (PERCOCET) 5-325 MG tablet Take 1 tablet by mouth every 6 (six) hours as needed for severe pain. 07/01/20 07/01/21  Gabriel Earing, PA-C  pantoprazole (PROTONIX) 40 MG tablet Take 1 tablet (40 mg total) by mouth daily. 01/18/20   Ghimire, Henreitta Leber, MD  QUEtiapine (SEROQUEL) 100 MG tablet Take 100 mg by mouth at bedtime as needed (sleep).  01/01/20   [provider]  rosuvastatin (CRESTOR) 10 MG tablet Take 1 tablet (10 mg total) by mouth daily. 06/23/20   Baglia, Corrina, PA-C  sevelamer carbonate (RENVELA) 800 MG tablet Take 4 tablets (3,200 mg total) by mouth 3 (three) times daily with meals. Patient taking differently: Take 4,000 mg by mouth See admin instructions. Take 4000 mg with each meal and snack 09/14/18   Monica Becton, MD    Allergies    Patient has no known allergies.  Review of Systems   Review of Systems  Unable to perform ROS: Mental status change    Physical Exam Updated Vital Signs BP (!) 150/96   Pulse (!) 110   Temp 98.2 F (36.8 C) (Axillary)   Resp 14   Ht 5\' 8"  (1.727 m)   Wt 74.3 kg   SpO2 100%   BMI 24.91 kg/m   Physical Exam Vitals and nursing note reviewed.  Constitutional:      Appearance: He is well-developed and well-nourished.  HENT:     Head: Normocephalic and atraumatic.  Eyes:     Conjunctiva/sclera: Conjunctivae normal.  Cardiovascular:     Heart sounds: Normal heart sounds. No murmur heard.     Comments: Irregularly irregular Pulmonary:     Effort: Pulmonary effort is normal.     Breath sounds: Rales present.  Abdominal:     General: Bowel sounds  are normal.     Palpations: Abdomen is soft.     Tenderness: There is no abdominal tenderness. There is no guarding or rebound.  Musculoskeletal:        General: No edema.     Cervical back: Neck supple.     Right lower leg: No edema.     Left lower leg: No edema.  Skin:    General: Skin  is warm and dry.  Neurological:     Mental Status: He is alert.     Comments: Somnolent but arouses to voice and noxious stimuli. On reassessment pt will answer some questions. Oriented to self and place. Follows some commands and moves all extremities  Psychiatric:        Mood and Affect: Mood and affect normal.     ED Results / Procedures / Treatments   Labs (all labs ordered are listed, but only abnormal results are displayed) Labs Reviewed  RESP PANEL BY RT-PCR (FLU A&B, COVID) ARPGX2 - Abnormal; Notable for the following components:      Result Value   SARS Coronavirus 2 by RT PCR POSITIVE (*)    All other components within normal limits  CBC WITH DIFFERENTIAL/PLATELET - Abnormal; Notable for the following components:   WBC 1.4 (*)    RBC 1.62 (*)    Hemoglobin 5.6 (*)    HCT 17.4 (*)    MCV 107.4 (*)    MCH 34.6 (*)    RDW 20.9 (*)    Platelets 121 (*)    Neutro Abs 1.1 (*)    Lymphs Abs 0.2 (*)    All other components within normal limits  BASIC METABOLIC PANEL - Abnormal; Notable for the following components:   Chloride 96 (*)    BUN 67 (*)    Creatinine, Ser 11.53 (*)    Calcium 8.6 (*)    GFR, Estimated 4 (*)    Anion gap 18 (*)    All other components within normal limits  HEPATIC FUNCTION PANEL - Abnormal; Notable for the following components:   Albumin 3.1 (*)    AST 8 (*)    All other components within normal limits  PROTIME-INR - Abnormal; Notable for the following components:   Prothrombin Time 18.0 (*)    INR 1.5 (*)    All other components within normal limits  CBG MONITORING, ED - Abnormal; Notable for the following components:   Glucose-Capillary 61 (*)     All other components within normal limits  CBG MONITORING, ED - Abnormal; Notable for the following components:   Glucose-Capillary 126 (*)    All other components within normal limits  I-STAT VENOUS BLOOD GAS, ED - Abnormal; Notable for the following components:   pH, Ven 7.507 (*)    pCO2, Ven 36.2 (*)    Bicarbonate 28.7 (*)    Acid-Base Excess 5.0 (*)    Calcium, Ion 1.01 (*)    HCT 17.0 (*)    Hemoglobin 5.8 (*)    All other components within normal limits  POC OCCULT BLOOD, ED - Abnormal; Notable for the following components:   Fecal Occult Bld POSITIVE (*)    All other components within normal limits  I-STAT CHEM 8, ED - Abnormal; Notable for the following components:   Chloride 97 (*)    BUN 72 (*)    Creatinine, Ser 12.60 (*)    Calcium, Ion 1.03 (*)    Hemoglobin 6.1 (*)    HCT 18.0 (*)    All other components within normal limits  TROPONIN I (HIGH SENSITIVITY) - Abnormal; Notable for the following components:   Troponin I (High Sensitivity) 47 (*)    All other components within normal limits  LACTIC ACID, PLASMA  AMMONIA  BLOOD GAS, VENOUS  TYPE AND SCREEN  PREPARE RBC (CROSSMATCH)  TROPONIN I (HIGH SENSITIVITY)    EKG EKG Interpretation  Date/Time:  Tuesday July 28 2020  15:58:35 EST Ventricular Rate:  117 PR Interval:    QRS Duration: 115 QT Interval:  372 QTC Calculation: 519 R Axis:   51 Text Interpretation: Atrial flutter with predominant 2:1 AV block Nonspecific intraventricular conduction delay Repol abnrm, severe global ischemia (LM/MVD) Confirmed by Madalyn Rob [phone]) on 07/28/2020 6:43:50 PM   Radiology CT Head Wo Contrast  Result Date: 07/28/2020 CLINICAL DATA:  Delirium. EXAM: CT HEAD WITHOUT CONTRAST TECHNIQUE: Contiguous axial images were obtained from the base of the skull through the vertex without intravenous contrast. COMPARISON:  CT head May 28, 2019 FINDINGS: Brain: No evidence of acute large vascular territory infarction,  hemorrhage, hydrocephalus, extra-axial collection or mass lesion/mass effect. Mild patchy white matter hypodensities, most likely related to chronic microvascular ischemic disease. Vascular: Calcific atherosclerosis. No hyperdense vessel identified. Skull: No acute fracture. Sinuses/Orbits: Visualized sinuses are clear. Remote left medial orbital wall fracture. Other: No mastoid effusions. IMPRESSION: No evidence of acute intracranial abnormality. Electronically Signed   By: Margaretha Sheffield MD   On: 07/28/2020 16:03   DG Chest Portable 1 View  Result Date: 07/28/2020 CLINICAL DATA:  Chest pain x1 day. EXAM: PORTABLE CHEST 1 VIEW COMPARISON:  June 20, 2020 FINDINGS: The cardiac silhouette is markedly enlarged. Moderate severity calcification of the aortic arch is seen. Both lungs are clear. The visualized skeletal structures are unremarkable. IMPRESSION: Cardiomegaly without evidence of acute or active cardiopulmonary disease. Electronically Signed   By: Virgina Norfolk M.D.   On: 07/28/2020 16:32    Procedures Procedures (including critical care time) CRITICAL CARE Performed by: Rodney Booze   Total critical care time: 38 minutes  Critical care time was exclusive of separately billable procedures and treating other patients.  Critical care was necessary to treat or prevent imminent or life-threatening deterioration.  Critical care was time spent personally by me on the following activities: development of treatment plan with patient and/or surrogate as well as nursing, discussions with consultants, evaluation of patient's response to treatment, examination of patient, obtaining history from patient or surrogate, ordering and performing treatments and interventions, ordering and review of laboratory studies, ordering and review of radiographic studies, pulse oximetry and re-evaluation of patient's condition.   Medications Ordered in ED Medications  pantoprazole (PROTONIX) injection  40 mg (has no administration in time range)  dextrose 50 % solution 50 mL (50 mLs Intravenous Given 07/28/20 1542)  naloxone (NARCAN) injection 0.4 mg (0.4 mg Intravenous Given 07/28/20 1620)  0.9 %  sodium chloride infusion (Manually program via Guardrails IV Fluids) ( Intravenous New Bag/Given 07/28/20 1839)    ED Course  I have reviewed the triage vital signs and the nursing notes.  Pertinent labs & imaging results that were available during my care of the patient were reviewed by me and considered in my medical decision making (see chart for details).    MDM Rules/Calculators/A&P                          65 year old male presenting to the emergency department today for evaluation of altered mental status.  Was at dialysis prior to arrival and sent here for further assessment.  Reviewed/interpreted lab CBC with leukopenia and anemia which is worse from prior.  - hemoccult +, no melena on exam   - 1 unit prbc given BMP with elevated BUN/creatinine consistent with history of ESRD LFTs are normal Lactic acid is negative Ammonia normal Troponin is marginally elevated INR mildly elevated VBG shows mild alkalosis,  PCO2 low COVID is positive  EKG Atrial flutter with predominant 2:1 AV block, Nonspecific intraventricular conduction delay, Repol abnrm, severe global ischemia (LM/MVD)  CXR -  Cardiomegaly without evidence of acute or active cardiopulmonary disease. CT head - No evidence of acute intracranial abnormality.  Pt with gi bleed with subsequent anemia requiring prbc. Will admit for this. Also with AMS of unclear etiology. This has improved somewhat since he has been observed in the ED and I have increased suspicion for substance use as the cause of his encephalopathy however he will require further monitoring.   Discussed case with Dr. Rush Landmark through secure chat and GI agrees to consult on the patient tomorrow. Recommends pt be npo at midnight and recommends iv ppi.   7:27  PM CONSULT with Dr. Velia Meyer who accepts patient for admission.    Final Clinical Impression(s) / ED Diagnoses Final diagnoses:  Anemia, unspecified type  Gastrointestinal hemorrhage, unspecified gastrointestinal hemorrhage type  COVID    Rx / DC Orders ED Discharge Orders    None       Rodney Booze, PA-C 07/28/20 1927    Bishop Dublin 07/28/20 1935    Lucrezia Starch, MD 07/29/20 1759

## 2020-07-28 NOTE — ED Triage Notes (Signed)
Pt from dialysis with ems for AMS. Staff reports pt wasn't as talkative upon arrival to dialysis but proceeded with 2.5 hrs out of 3 hrs of dialysis. His Hr jumped up to 150 so they called ems. Pt arrives to ed arousable to voice, oriented to self only. CBG 61.

## 2020-07-29 ENCOUNTER — Encounter (HOSPITAL_COMMUNITY): Payer: Self-pay | Admitting: Internal Medicine

## 2020-07-29 DIAGNOSIS — G9341 Metabolic encephalopathy: Secondary | ICD-10-CM | POA: Diagnosis present

## 2020-07-29 DIAGNOSIS — Z7901 Long term (current) use of anticoagulants: Secondary | ICD-10-CM

## 2020-07-29 DIAGNOSIS — K31819 Angiodysplasia of stomach and duodenum without bleeding: Secondary | ICD-10-CM

## 2020-07-29 DIAGNOSIS — K921 Melena: Secondary | ICD-10-CM

## 2020-07-29 DIAGNOSIS — D62 Acute posthemorrhagic anemia: Secondary | ICD-10-CM

## 2020-07-29 DIAGNOSIS — U071 COVID-19: Secondary | ICD-10-CM | POA: Diagnosis present

## 2020-07-29 DIAGNOSIS — K31811 Angiodysplasia of stomach and duodenum with bleeding: Secondary | ICD-10-CM | POA: Insufficient documentation

## 2020-07-29 DIAGNOSIS — K922 Gastrointestinal hemorrhage, unspecified: Secondary | ICD-10-CM | POA: Diagnosis present

## 2020-07-29 DIAGNOSIS — K5521 Angiodysplasia of colon with hemorrhage: Secondary | ICD-10-CM | POA: Insufficient documentation

## 2020-07-29 DIAGNOSIS — D649 Anemia, unspecified: Secondary | ICD-10-CM

## 2020-07-29 LAB — COMPREHENSIVE METABOLIC PANEL
ALT: 9 U/L (ref 0–44)
AST: 9 U/L — ABNORMAL LOW (ref 15–41)
Albumin: 2.9 g/dL — ABNORMAL LOW (ref 3.5–5.0)
Alkaline Phosphatase: 50 U/L (ref 38–126)
Anion gap: 18 — ABNORMAL HIGH (ref 5–15)
BUN: 78 mg/dL — ABNORMAL HIGH (ref 8–23)
CO2: 24 mmol/L (ref 22–32)
Calcium: 9.7 mg/dL (ref 8.9–10.3)
Chloride: 99 mmol/L (ref 98–111)
Creatinine, Ser: 13.65 mg/dL — ABNORMAL HIGH (ref 0.61–1.24)
GFR, Estimated: 4 mL/min — ABNORMAL LOW (ref >60)
Glucose, Bld: 77 mg/dL (ref 70–99)
Potassium: 5.1 mmol/L (ref 3.5–5.1)
Sodium: 141 mmol/L (ref 135–145)
Total Bilirubin: 0.9 mg/dL (ref 0.3–1.2)
Total Protein: 6.2 g/dL — ABNORMAL LOW (ref 6.5–8.1)

## 2020-07-29 LAB — CBC WITH DIFFERENTIAL/PLATELET
Abs Immature Granulocytes: 0 10*3/uL (ref 0.00–0.07)
Basophils Absolute: 0 10*3/uL (ref 0.0–0.1)
Basophils Relative: 0 %
Eosinophils Absolute: 0 10*3/uL (ref 0.0–0.5)
Eosinophils Relative: 2 %
HCT: 21.9 % — ABNORMAL LOW (ref 39.0–52.0)
Hemoglobin: 6.8 g/dL — CL (ref 13.0–17.0)
Immature Granulocytes: 0 %
Lymphocytes Relative: 27 %
Lymphs Abs: 0.6 10*3/uL — ABNORMAL LOW (ref 0.7–4.0)
MCH: 32.9 pg (ref 26.0–34.0)
MCHC: 31.1 g/dL (ref 30.0–36.0)
MCV: 105.8 fL — ABNORMAL HIGH (ref 80.0–100.0)
Monocytes Absolute: 0.3 10*3/uL (ref 0.1–1.0)
Monocytes Relative: 14 %
Neutro Abs: 1.3 10*3/uL — ABNORMAL LOW (ref 1.7–7.7)
Neutrophils Relative %: 57 %
Platelets: 117 10*3/uL — ABNORMAL LOW (ref 150–400)
RBC: 2.07 MIL/uL — ABNORMAL LOW (ref 4.22–5.81)
RDW: 25 % — ABNORMAL HIGH (ref 11.5–15.5)
WBC: 2.2 10*3/uL — ABNORMAL LOW (ref 4.0–10.5)
nRBC: 0 % (ref 0.0–0.2)

## 2020-07-29 LAB — CBG MONITORING, ED
Glucose-Capillary: 76 mg/dL (ref 70–99)
Glucose-Capillary: 80 mg/dL (ref 70–99)
Glucose-Capillary: 76 mg/dL (ref 70–99)

## 2020-07-29 LAB — HEMOGLOBIN AND HEMATOCRIT, BLOOD
HCT: 21 % — ABNORMAL LOW (ref 39.0–52.0)
HCT: 22.9 % — ABNORMAL LOW (ref 39.0–52.0)
Hemoglobin: 6.9 g/dL — CL (ref 13.0–17.0)
Hemoglobin: 7.1 g/dL — ABNORMAL LOW (ref 13.0–17.0)

## 2020-07-29 LAB — RETICULOCYTES
Immature Retic Fract: 20.7 % — ABNORMAL HIGH (ref 2.3–15.9)
RBC.: 2.02 MIL/uL — ABNORMAL LOW (ref 4.22–5.81)
Retic Count, Absolute: 26.1 10*3/uL (ref 19.0–186.0)
Retic Ct Pct: 1.3 % (ref 0.4–3.1)

## 2020-07-29 LAB — TSH: TSH: 4.833 u[IU]/mL — ABNORMAL HIGH (ref 0.350–4.500)

## 2020-07-29 LAB — FIBRINOGEN: Fibrinogen: 329 mg/dL (ref 210–475)

## 2020-07-29 LAB — LACTATE DEHYDROGENASE: LDH: 183 U/L (ref 98–192)

## 2020-07-29 LAB — PROTIME-INR
INR: 1.5 — ABNORMAL HIGH (ref 0.8–1.2)
Prothrombin Time: 17.6 seconds — ABNORMAL HIGH (ref 11.4–15.2)

## 2020-07-29 LAB — FOLATE: Folate: 8.3 ng/mL (ref >5.9)

## 2020-07-29 LAB — MAGNESIUM: Magnesium: 2 mg/dL (ref 1.7–2.4)

## 2020-07-29 LAB — IRON AND TIBC
Iron: 153 ug/dL (ref 45–182)
Saturation Ratios: 52 % — ABNORMAL HIGH (ref 17.9–39.5)
TIBC: 295 ug/dL (ref 250–450)
UIBC: 142 ug/dL

## 2020-07-29 LAB — C-REACTIVE PROTEIN: CRP: 0.6 mg/dL (ref <1.0)

## 2020-07-29 LAB — PROCALCITONIN: Procalcitonin: 0.48 ng/mL

## 2020-07-29 LAB — TROPONIN I (HIGH SENSITIVITY): Troponin I (High Sensitivity): 87 ng/L — ABNORMAL HIGH (ref <18)

## 2020-07-29 LAB — PHOSPHORUS: Phosphorus: 7.7 mg/dL — ABNORMAL HIGH (ref 2.5–4.6)

## 2020-07-29 LAB — PREPARE RBC (CROSSMATCH)

## 2020-07-29 LAB — FERRITIN: Ferritin: 484 ng/mL — ABNORMAL HIGH (ref 24–336)

## 2020-07-29 MED ORDER — PEG-KCL-NACL-NASULF-NA ASC-C 100 G PO SOLR
0.5000 | Freq: Once | ORAL | Status: AC
  Administered 2020-07-30: 100 g via ORAL
  Filled 2020-07-29: qty 1

## 2020-07-29 MED ORDER — PANTOPRAZOLE SODIUM 40 MG PO TBEC
40.0000 mg | DELAYED_RELEASE_TABLET | Freq: Every day | ORAL | Status: DC
  Administered 2020-07-29 – 2020-08-03 (×5): 40 mg via ORAL
  Filled 2020-07-29 (×5): qty 1

## 2020-07-29 MED ORDER — AMLODIPINE BESYLATE 5 MG PO TABS
5.0000 mg | ORAL_TABLET | Freq: Every day | ORAL | Status: DC
  Administered 2020-07-29 – 2020-08-03 (×4): 5 mg via ORAL
  Filled 2020-07-29 (×6): qty 1

## 2020-07-29 MED ORDER — CALCITRIOL 0.5 MCG PO CAPS
1.5000 ug | ORAL_CAPSULE | ORAL | Status: DC
  Administered 2020-07-30 – 2020-08-01 (×2): 1.5 ug via ORAL
  Filled 2020-07-29: qty 3
  Filled 2020-07-29: qty 6
  Filled 2020-07-29: qty 3

## 2020-07-29 MED ORDER — STERILE WATER FOR INJECTION IJ SOLN
INTRAMUSCULAR | Status: AC
  Filled 2020-07-29: qty 10

## 2020-07-29 MED ORDER — SODIUM CHLORIDE 0.9% IV SOLUTION: Freq: Once | INTRAVENOUS | Status: AC

## 2020-07-29 MED ORDER — NOREPINEPHRINE 4 MG/250ML-% IV SOLN: 0.0000 ug/min | INTRAVENOUS | Status: DC

## 2020-07-29 MED ORDER — CINACALCET HCL 30 MG PO TABS
180.0000 mg | ORAL_TABLET | ORAL | Status: DC
  Administered 2020-07-30 – 2020-08-01 (×2): 180 mg via ORAL
  Filled 2020-07-29 (×3): qty 6

## 2020-07-29 MED ORDER — SODIUM ZIRCONIUM CYCLOSILICATE 10 G PO PACK
10.0000 g | PACK | Freq: Once | ORAL | Status: AC
  Administered 2020-07-29: 10 g via ORAL
  Filled 2020-07-29: qty 1

## 2020-07-29 MED ORDER — DARBEPOETIN ALFA 200 MCG/0.4ML IJ SOSY
200.0000 ug | PREFILLED_SYRINGE | INTRAMUSCULAR | Status: DC
Start: 2020-07-29 — End: 2020-08-02
  Administered 2020-07-29: 200 ug via SUBCUTANEOUS
  Filled 2020-07-29: qty 0.4

## 2020-07-29 NOTE — ED Notes (Signed)
Visually checked om Pt. He state that he thought he was having trouble breathing. He was at 95% and resp were WDL. Will continue to montior

## 2020-07-29 NOTE — Consult Note (Signed)
Renal Service Consult Note [facility]  Raymond [name] 07/29/2020 [name], MD Requesting Physician: Dr. Reesa Chew, A.   Reason for Consult: ESRD pt w/ COVID and AMS HPI: The patient is a 65 y.o. year-old w/ hx of afib, hepatitic C, ESRD on HD, CAD , PAD and GIB was sent to ED from his HD unit yest 1/18 due to AMS. Had 2.5 out of 3h HD. In ED Hb was 6.1 and prbc's ordered. MS improved spontaneously. Pt admitted for poss GIB. Asked to see for ESRD.    Pt seen in ED, no c/o's, doesn't remember coming here. No CP or fevers or chills no cough , no n/v/d.     ROS  denies CP  no joint pain   no HA  no blurry vision  no rash  no diarrhea  no nausea/ vomiting   Past Medical History  Past Medical History:  Diagnosis Date  . Anemia   . CAD (coronary artery disease)    a. 03/2018: cath showing 20 to 30% stenosis along the LAD, luminal irregularities along the RCA, and angiographically normal LCx  . COPD (chronic obstructive pulmonary disease) ([place])   . ESRD (end stage renal disease) on dialysis [facility])    "MWF; Jeneen Rinks" (07/22/17)  . Hepatitis C    Still positive s/p liver biopsy at [facility]  and interferon therapy for 6 months. Most recent lab work was on 10/24/12  . Hepatitis C    "took the tx; gone now" (12/05/2016)  Was treated  . History of blood transfusion ~ 2012/2013   "related to my kidneys; blood was low"  . Hypertension   . Peripheral arterial disease ([place])   . Substance abuse ([place])   . Thyroid disease    Past Surgical History  Past Surgical History:  Procedure Laterality Date  . ABDOMINAL AORTOGRAM W/LOWER EXTREMITY N/A 02/08/2018   Procedure: ABDOMINAL AORTOGRAM W/LOWER EXTREMITY;  Surgeon: Conrad Crab Orchard, MD;  Location: [place] CV LAB;  Service: Cardiovascular;  Laterality: N/A;  . ABDOMINAL AORTOGRAM W/LOWER EXTREMITY Bilateral 06/15/2020   Procedure: ABDOMINAL AORTOGRAM W/LOWER EXTREMITY;  Surgeon: Waynetta Sandy, MD;  Location: [place] [facility];  Service: Cardiovascular;  Laterality: Bilateral;  . AV FISTULA PLACEMENT Left Aug. 2013 ?  [name] BIOPSY  01/17/2020   Procedure: BIOPSY;  Surgeon: Jackquline Denmark, MD;  Location: [place];  Service: Endoscopy;;  . COLONOSCOPY WITH PROPOFOL N/A 08/21/2018   Procedure: COLONOSCOPY WITH PROPOFOL;  Surgeon: Yetta Flock, MD;  Location: [place];  Service: Gastroenterology;  Laterality: N/A;  . ENTEROSCOPY N/A 01/17/2020   Procedure: ENTEROSCOPY;  Surgeon: Jackquline Denmark, MD;  Location: [facility] ENDOSCOPY;  Service: Endoscopy;  Laterality: N/A;  . ESOPHAGOGASTRODUODENOSCOPY (EGD) WITH PROPOFOL N/A 08/20/2018   Procedure: ESOPHAGOGASTRODUODENOSCOPY (EGD) WITH PROPOFOL;  Surgeon: Gatha Mayer, MD;  Location: [place];  Service: Endoscopy;  Laterality: N/A;  . FEMORAL-POPLITEAL BYPASS GRAFT Left 04/11/2018   Procedure: BYPASS GRAFT FEMORAL-POPLITEAL ARTERY LEFT LEG;  Surgeon: Rosetta Posner, MD;  Location: [place];  Service: Vascular;  Laterality: Left;  . HOT HEMOSTASIS N/A 08/20/2018   Procedure: HOT HEMOSTASIS (ARGON PLASMA COAGULATION/BICAP);  Surgeon: Gatha Mayer, MD;  Location: [facility] ENDOSCOPY;  Service: Endoscopy;  Laterality: N/A;  . HOT HEMOSTASIS N/A 01/17/2020   Procedure: HOT HEMOSTASIS (ARGON PLASMA COAGULATION/BICAP);  Surgeon: Jackquline Denmark, MD;  Location: [facility] ENDOSCOPY;  Service: Endoscopy;  Laterality: N/A;  . LEFT HEART CATH AND CORONARY ANGIOGRAPHY N/A 03/29/2018   Procedure:  LEFT HEART CATH AND CORONARY ANGIOGRAPHY;  Surgeon: Nelva Bush, MD;  Location: [place] [facility];  Service: Cardiovascular;  Laterality: N/A;  . LIVER BIOPSY    . ORIF ULNAR FRACTURE Left 05/18/2017   Procedure: OPEN REDUCTION INTERNAL FIXATION (ORIF) ULNAR FRACTURE;  Surgeon: Iran Planas, MD;  Location: [place];  Service: Orthopedics;  Laterality: Left;  . PERIPHERAL VASCULAR INTERVENTION Right 06/15/2020   Procedure: PERIPHERAL VASCULAR INTERVENTION;  Surgeon: Waynetta Sandy, MD;   Location: [place] CV LAB;  Service: Cardiovascular;  Laterality: Right;  . POLYPECTOMY  08/21/2018   Procedure: POLYPECTOMY;  Surgeon: Yetta Flock, MD;  Location: [facility] ENDOSCOPY;  Service: Gastroenterology;;  . SHUNTOGRAM N/A 12/11/2012   Procedure: Earney Mallet;  Surgeon: Serafina Mitchell, MD;  Location: [facility] CATH LAB;  Service: Cardiovascular;  Laterality: N/A;  . WOUND EXPLORATION Left 06/15/2020   Procedure: GROIN  EXPLORATION;  Surgeon: Waynetta Sandy, MD;  Location: [facility] OR;  Service: Vascular;  Laterality: Left;   Family History  Family History  Problem Relation Age of Onset  . Diabetes Father   . Hypertension Father   . Heart disease Father    Social History  reports that he has been smoking cigarettes. He has a 12.50 pack-year smoking history. He has never used smokeless tobacco. He reports current alcohol use. He reports previous drug use. Drug: Cocaine. Allergies No Known Allergies Home medications Prior to Admission medications   Medication Sig Start Date End Date Taking? Authorizing Provider  Methoxy PEG-Epoetin Beta (MIRCERA IJ) Mircera 07/09/20 07/08/21 Yes [provider]  albuterol (VENTOLIN HFA) 108 (90 Base) MCG/ACT inhaler Inhale 2 puffs into the lungs every 4 (four) hours as needed for shortness of breath.  11/06/19   [provider]  amiodarone (PACERONE) 200 MG tablet Take 1 tablet (200 mg total) by mouth daily. Take two tablets once a day for 7 days. Starting on 06/30/20 take only one tablet daily 06/30/20   Baglia, Corrina, PA-C  amLODipine (NORVASC) 2.5 MG tablet Take 2.5 mg by mouth at bedtime.  01/01/20   [provider]  apixaban (ELIQUIS) 5 MG TABS tablet Take 1 tablet (5 mg total) by mouth 2 (two) times daily. 06/22/20   Baglia, Corrina, PA-C  bismuth subsalicylate (PEPTO BISMOL) 262 MG chewable tablet Chew 524 mg by mouth as needed for indigestion or diarrhea or loose stools.    [provider]  clopidogrel  (PLAVIX) 75 MG tablet Take 1 tablet (75 mg total) by mouth daily. 06/23/20   Baglia, Corrina, PA-C  oxyCODONE (OXY IR/ROXICODONE) 5 MG immediate release tablet Take 5 mg by mouth 3 (three) times daily as needed. 07/01/20   [provider]  oxyCODONE-acetaminophen (PERCOCET) 5-325 MG tablet Take 1 tablet by mouth every 6 (six) hours as needed for severe pain. 07/01/20 07/01/21  Gabriel Earing, PA-C  pantoprazole (PROTONIX) 40 MG tablet Take 1 tablet (40 mg total) by mouth daily. 01/18/20   Ghimire, Henreitta Leber, MD  QUEtiapine (SEROQUEL) 100 MG tablet Take 100 mg by mouth at bedtime as needed (sleep).  01/01/20   [provider]  rosuvastatin (CRESTOR) 10 MG tablet Take 1 tablet (10 mg total) by mouth daily. 06/23/20   Baglia, Corrina, PA-C  sevelamer carbonate (RENVELA) 800 MG tablet Take 4 tablets (3,200 mg total) by mouth 3 (three) times daily with meals. Patient taking differently: Take 4,000 mg by mouth See admin instructions. Take 4000 mg with each meal and snack 09/14/18   Monica Becton, MD  Vitals:   07/29/20 0945 07/29/20 1005 07/29/20 1026 07/29/20 1300  BP: (!) 140/107 (!) 140/107 (!) 154/97 (!) 160/121  Pulse: (!) 114 (!) 114  (!) 110  Resp: 15 15 16 20   Temp:  98.2 F (36.8 C) 97.8 F (36.6 C)   TempSrc:  Oral Oral   SpO2: 98% 99% 100% 91%  Weight:      Height:       Exam  alert, nad , ox 3  no jvd  Chest cta bilat  Cor reg no RG  Abd soft ntnd no ascites   Ext no LE edema   Alert, NF, ox3   LUA AVF+bruit     Home meds:  - amiodarone/ norvasc / eliquis/ plavix/ crestor  - seroquel/ percocet prn  - protonix / renvlea 4 tabs ac tid  - prn's/ vitamins/ supplements  CXR - IMPRESSION: Cardiomegaly without evidence of acute or active cardiopulmonary disease.    OP HD: TTS  4h  450 /800  69.5kg  2/2 bath P4  AVF  Hep none  - sensipar 180 tiw  - mircera 200 ug q2, last 12/30  - calcitriol 1.5ug tiw po  - had 3h HD yest after missing 1  week   Assessment/ Plan: 1. GI bleed / anemia - HB 6.1, GI consulting 2. COVID infection - CXR neg, on RA 3. ESRD - on HD TTS. Missed 1 week then got HD yest x 3h. HD tomorrow.  4. AMS - uremia from missed HD, covid and/or other. Better today.  5. Atrial fib - on amio 6. PAD - on asa / plavix 7. HTN - cont home med norvasc 8. Anemia ckd - due for esa, ordered 200ug darbe q wed sq 9. MBD ckd - cont vdra , sensipar and binder      Rob Io Dieujuste  MD 07/29/2020, 1:47 PM  Recent Labs  Lab 07/28/20 1539 07/28/20 1624 07/29/20 0506 07/29/20 0900  WBC 1.4*  --  2.2*  --   HGB 5.6*   < > 6.8* 7.1*   < > = values in this interval not displayed.   Recent Labs  Lab 07/28/20 1539 07/28/20 1624 07/28/20 1837 07/29/20 0506  K 3.6   < > 3.9 5.1  BUN 67*  --  72* 78*  CREATININE 11.53*  --  12.60* 13.65*  CALCIUM 8.6*  --   --  9.7  PHOS  --   --   --  7.7*   < > = values in this interval not displayed.

## 2020-07-29 NOTE — Progress Notes (Signed)
PROGRESS NOTE    Raymond [name]  MVH:[id number] DOB: April 12, 1956 DOA: 07/28/2020 PCP: [name], FNP   Brief Narrative:  65 year old with history of P A. fib on Eliquis, hep C, ESRD Monday Wednesday Friday, CAD, PVD s/p femoropopliteal bypass, GI bleed from AVM was sent from his dialysis due to tachycardia and drowsiness.  In the hospital he was found to have a hemoglobin of 6.1 therefore transfusion was ordered.  GI team was consulted.   Assessment & Plan:   Principal Problem:   Acute on chronic anemia Active Problems:   ESRD (end stage renal disease) (HCC)   Hypoglycemia   Acute upper GI bleed   Acute metabolic encephalopathy   COVID-19 virus infection   Acute blood loss anemia, source GI -GI consulted.  Plans for endoscopic evaluation tomorrow.  Also will place video capsule.  Continue to hold Eliquis and Plavix.  Acute metabolic encephalopathy - This was improved.  Mentating well this morning and answering all the questions appropriately.  Elevated troponin - From demand ischemia.  Supportive care.  ESRD TTS - Nephrology consulted.  History of paroxysmal atrial fibrillation - Eliquis on hold.  On amiodarone.  COVID-19, asymptomatic - Continue airborne and contact precautions.  Chest x-ray is clear.  He has had both of his vaccines and booster.  Symptomatic management.  DVT prophylaxis: SCDs Code Status: Full code Family Communication:    Status is: Inpatient  Remains inpatient appropriate because:Inpatient level of care appropriate due to severity of illness   Dispo: The patient is from: Home              Anticipated d/c is to: Home              Anticipated d/c date is: 2 days              Patient currently is not medically stable to d/c.  Plans for endoscopic evaluation tomorrow       Body mass index is 24.91 kg/m.      Subjective: Seen and examined at bedside feels great no complaints.  Review of Systems Otherwise negative except as per  HPI, including: General: Denies fever, chills, night sweats or unintended weight loss. Resp: Denies cough, wheezing, shortness of breath. Cardiac: Denies chest pain, palpitations, orthopnea, paroxysmal nocturnal dyspnea. GI: Denies abdominal pain, nausea, vomiting, diarrhea or constipation GU: Denies dysuria, frequency, hesitancy or incontinence MS: Denies muscle aches, joint pain or swelling Neuro: Denies headache, neurologic deficits (focal weakness, numbness, tingling), abnormal gait Psych: Denies anxiety, depression, SI/HI/AVH Skin: Denies new rashes or lesions ID: Denies sick contacts, exotic exposures, travel  Examination:  General exam: Appears calm and comfortable  Respiratory system: Clear to auscultation. Respiratory effort normal. Cardiovascular system: S1 & S2 heard, RRR. No JVD, murmurs, rubs, gallops or clicks. No pedal edema. Gastrointestinal system: Abdomen is nondistended, soft and nontender. No organomegaly or masses felt. Normal bowel sounds heard. Central nervous system: Alert and oriented. No focal neurological deficits. Extremities: Symmetric 5 x 5 power. Skin: No rashes, lesions or ulcers Psychiatry: Judgement and insight appear normal. Mood & affect appropriate.     Objective: Vitals:   07/29/20 0930 07/29/20 0945 07/29/20 1005 07/29/20 1026  BP: (!) 162/106 (!) 140/107 (!) 140/107 (!) 154/97  Pulse:  (!) 114 (!) 114   Resp: 16 15 15 16   Temp:   98.2 F (36.8 C) 97.8 F (36.6 C)  TempSrc:   Oral Oral  SpO2:  98% 99% 100%  Weight:  PROGRESS NOTE    Raymond [name]  [id number]:[id number] DOB: 08-17-1955 DOA: 07/28/2020 PCP: [name], FNP   Brief Narrative:  65 year old with history of P A. fib on Eliquis, hep C, ESRD Monday Wednesday Friday, CAD, PVD s/p femoropopliteal bypass, GI bleed from AVM was sent from his dialysis due to tachycardia and drowsiness.  In the hospital he was found to have a hemoglobin of 6.1 therefore transfusion was ordered.  GI team was consulted.   Assessment & Plan:   Principal Problem:   Acute on chronic anemia Active Problems:   ESRD (end stage renal disease) (HCC)   Hypoglycemia   Acute upper GI bleed   Acute metabolic encephalopathy   COVID-19 virus infection   Acute blood loss anemia, source GI -GI consulted.  Plans for endoscopic evaluation tomorrow.  Also will place video capsule.  Continue to hold Eliquis and Plavix.  Acute metabolic encephalopathy - This was improved.  Mentating well this morning and answering all the questions appropriately.  Elevated troponin - From demand ischemia.  Supportive care.  ESRD TTS - Nephrology consulted.  History of paroxysmal atrial fibrillation - Eliquis on hold.  On amiodarone.  COVID-19, asymptomatic - Continue airborne and contact precautions.  Chest x-ray is clear.  He has had both of his vaccines and booster.  Symptomatic management.  DVT prophylaxis: SCDs Code Status: Full code Family Communication:    Status is: Inpatient  Remains inpatient appropriate because:Inpatient level of care appropriate due to severity of illness   Dispo: The patient is from: Home              Anticipated d/c is to: Home              Anticipated d/c date is: 2 days              Patient currently is not medically stable to d/c.  Plans for endoscopic evaluation tomorrow       Body mass index is 24.91 kg/m.      Subjective: Seen and examined at bedside feels great no complaints.  Review of Systems Otherwise negative except as per  HPI, including: General: Denies fever, chills, night sweats or unintended weight loss. Resp: Denies cough, wheezing, shortness of breath. Cardiac: Denies chest pain, palpitations, orthopnea, paroxysmal nocturnal dyspnea. GI: Denies abdominal pain, nausea, vomiting, diarrhea or constipation GU: Denies dysuria, frequency, hesitancy or incontinence MS: Denies muscle aches, joint pain or swelling Neuro: Denies headache, neurologic deficits (focal weakness, numbness, tingling), abnormal gait Psych: Denies anxiety, depression, SI/HI/AVH Skin: Denies new rashes or lesions ID: Denies sick contacts, exotic exposures, travel  Examination:  General exam: Appears calm and comfortable  Respiratory system: Clear to auscultation. Respiratory effort normal. Cardiovascular system: S1 & S2 heard, RRR. No JVD, murmurs, rubs, gallops or clicks. No pedal edema. Gastrointestinal system: Abdomen is nondistended, soft and nontender. No organomegaly or masses felt. Normal bowel sounds heard. Central nervous system: Alert and oriented. No focal neurological deficits. Extremities: Symmetric 5 x 5 power. Skin: No rashes, lesions or ulcers Psychiatry: Judgement and insight appear normal. Mood & affect appropriate.     Objective: Vitals:   07/29/20 0930 07/29/20 0945 07/29/20 1005 07/29/20 1026  BP: (!) 162/106 (!) 140/107 (!) 140/107 (!) 154/97  Pulse:  (!) 114 (!) 114   Resp: _0 Temp:   98.2 F (36.8 C) 97.8 F (36.6 C)  TempSrc:   Oral Oral  SpO2:  98% 99% 100%  Weight:  during the acute phase of infection. Positive results are indicative of the presence of the identified virus, but do not rule out bacterial infection or co-infection with other pathogens not detected by the test. Clinical correlation with patient history and other diagnostic information is necessary to determine patient infection status. The expected result is Negative.  Fact Sheet for Patients: BloggerCourse.com  Fact Sheet for Healthcare Providers: SeriousBroker.it  This test is not yet approved or cleared by the [place] FDA and  has been authorized for detection and/or diagnosis of SARS-CoV-2 by FDA under an Emergency Use Authorization (EUA).  This EUA will remain in effect (meaning this test  can be used) for the duration of  the COVID-19 declaration under Section 564(b)(1) of the Act, 21 U.S.C. section 360bbb-3(b)(1), unless the authorization is terminated or revoked sooner.     Influenza A by PCR NEGATIVE NEGATIVE Final   Influenza B by PCR NEGATIVE NEGATIVE Final    Comment: (NOTE) The Xpert Xpress SARS-CoV-2/FLU/RSV plus assay is intended as an aid in the diagnosis of influenza from Nasopharyngeal swab specimens and should not be used as a sole basis for treatment. Nasal washings and aspirates are unacceptable for Xpert Xpress SARS-CoV-2/FLU/RSV testing.  Fact Sheet for Patients: BloggerCourse.com  Fact Sheet for Healthcare Providers: SeriousBroker.it  This  test is not yet approved or cleared by the [place] FDA and has been authorized for detection and/or diagnosis of SARS-CoV-2 by FDA under an Emergency Use Authorization (EUA). This EUA will remain in effect (meaning this test can be used) for the duration of the COVID-19 declaration under Section 564(b)(1) of the Act, 21 U.S.C. section 360bbb-3(b)(1), unless the authorization is terminated or revoked.  Performed at [facility], [address], [place], [place] [address]          Radiology Studies: CT Head Wo Contrast  Result Date: 07/28/2020 CLINICAL DATA:  Delirium. EXAM: CT HEAD WITHOUT CONTRAST TECHNIQUE: Contiguous axial images were obtained from the base of the skull through the vertex without intravenous contrast. COMPARISON:  CT head May 28, 2019 FINDINGS: Brain: No evidence of acute large vascular territory infarction, hemorrhage, hydrocephalus, extra-axial collection or mass lesion/mass effect. Mild patchy white matter hypodensities, most likely related to chronic microvascular ischemic disease. Vascular: Calcific atherosclerosis. No hyperdense vessel identified. Skull: No acute fracture. Sinuses/Orbits: Visualized sinuses are clear. Remote left medial orbital wall fracture. Other: No mastoid effusions. IMPRESSION: No evidence of acute intracranial abnormality. Electronically Signed   By: Feliberto Harts MD   On: 07/28/2020 16:03   DG Chest Portable 1 View  Result Date: 07/28/2020 CLINICAL DATA:  Chest pain x1 day. EXAM: PORTABLE CHEST 1 VIEW COMPARISON:  June 20, 2020 FINDINGS: The cardiac silhouette is markedly enlarged. Moderate severity calcification of the aortic arch is seen. Both lungs are clear. The visualized skeletal structures are unremarkable. IMPRESSION: Cardiomegaly without evidence of acute or active cardiopulmonary disease. Electronically Signed   By: Aram Candela M.D.   On: 07/28/2020 16:32        Scheduled Meds: . amiodarone  200  mg Oral Daily  . pantoprazole  40 mg Oral Q0600  . peg 3350 powder  0.5 kit Oral Once  . sterile water (preservative free)       Continuous Infusions:   LOS: 1 day   Time spent= 35 mins    Londell Noll Joline Maxcy, MD [facility]  If 7PM-7AM, please contact night-coverage  07/29/2020, 11:57 AM

## 2020-07-29 NOTE — ED Notes (Signed)
Sending for blood now

## 2020-07-29 NOTE — Consult Note (Addendum)
[facility] Consult: 8:23 AM 07/29/2020  LOS: 1 day    Referring Provider: Dr Reesa Chew  Primary Care Physician:  [name], FNP Primary Gastroenterologist:  Dr. Havery Moros     Reason for Consultation:  Recurrent blood loss anemia   HPI: Raymond [name] is a 65 y.o. male.  Hx PAF, on Eliquis.  Eradicated hep C.  ESRD on HD MWF.  CAD.  PVD, s/p L fem-pop 2019. S/p 06/2020 stenting of right common and external iliac arteries, later that I required evacuation of left groin and scrotal hematoma and repair left common femoral artery cannulation.  COPD.  Thrombocytopenia.  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  Has required transfusions for blood loss anemia, 1 PRBC in 08/2018, 3 PRBCs in 01/28/2020, 2 PRBCs in 06/29/2020.   08/2018 EGD.  For nonbleeding duodenal AVMs treated with APC. 08/2018 colonoscopy with adequate prep revealed diverticulosis, nonbleeding hemorrhoids, several small adenomatous polyps removed. 01/2020 SBE.  Solitary nonbleeding gastric AVM treated with APC.  Biopsy of nodular mucosa in D1 (path: peptic duodenitis).  6, nonbleeding AVMs in the distal duodenum, proximal and mid jejunum were all treated with APC.  After this SBE, it was recommended that patient undergo VCE if recurrent bleeding. 01/2020 CTAP w angio: No active GI bleed.  Thickened GB wall with possible small pericholecystic fluid findings may be related to underlying liver disease though cholecystitis not excluded.  Scattered sigmoid diverticulosis.  No liver disease or biliary duct abnormalities Patient not reachable by phone for setting up return GI office visit, not respond to mailed letter..  Tachy to 150 during HD yesterday, and less alert/conversant than baseline so sent to ED after 2.5 of planned 3 h of HD.  Excessive bleeding from AV fistula.  Missed some HD sessions in last 2 weeks.  Hx evolved to 4 to 5 d of dark, formed stools. No abdominal/epigastric pain.  No nausea or vomiting.  No change in appetite.  No visible blood in the stool.  His 1 complaint is that of painless swelling at the lateral aspects of his neck bilaterally.  Hgb 5.6 >> 1 PRBC >> 6.8.  Was 8.7 on 06/20/2020 Chronic macrocytosis continues with MCV 107.   Thrombocytopenia continues with platelets 121.  INR 1.5.   Normal or above normal iron, TIBC, iron sats and ferritin.  Folate WNL. Normal lactic acid, CRP, procalcitonin. BUN/creatinine 78/13.6.  LFTs normal.  Troponin I 40s to 80s. Head CT negative for acute changes. CXR with cardiomegaly but no active cardiopulmonary disease.  Mental status improved this AM.      Past Medical History:  Diagnosis Date   Anemia    CAD (coronary artery disease)    a. 03/2018: cath showing 12 to 30% stenosis along the LAD, luminal irregularities along the RCA, and angiographically normal LCx   COPD (chronic obstructive pulmonary disease) (HCC)    Dyspnea    "with too much fluid"   ESRD (end stage renal disease) on dialysis [facility])    "MWF; Jeneen Rinks" (07/22/17)   Hemodialysis patient ([place])  Hepatitis C    Still positive s/p liver biopsy at [facility]  and interferon therapy for 6 months. Most recent lab work was on 10/24/12   Hepatitis C    "took the tx; gone now" (12/05/2016)  Was treated   History of blood transfusion ~ 2012/2013   "related to my kidneys; blood was low"   Hypertension    Peripheral arterial disease ([place])    Substance abuse ([place])    Thyroid disease     Past Surgical History:  Procedure Laterality Date   ABDOMINAL AORTOGRAM W/LOWER EXTREMITY N/A 02/08/2018   Procedure: ABDOMINAL AORTOGRAM W/LOWER EXTREMITY;  Surgeon: Conrad [place], MD;  Location: [place] CV LAB;  Service: Cardiovascular;  Laterality: N/A;   ABDOMINAL AORTOGRAM W/LOWER EXTREMITY Bilateral 06/15/2020   Procedure:  ABDOMINAL AORTOGRAM W/LOWER EXTREMITY;  Surgeon: Waynetta Sandy, MD;  Location: [place] CV LAB;  Service: Cardiovascular;  Laterality: Bilateral;   AV FISTULA PLACEMENT Left Aug. 2013 ?   BIOPSY  01/17/2020   Procedure: BIOPSY;  Surgeon: Jackquline Denmark, MD;  Location: [facility] ENDOSCOPY;  Service: Endoscopy;;   COLONOSCOPY     COLONOSCOPY WITH PROPOFOL N/A 08/21/2018   Procedure: COLONOSCOPY WITH PROPOFOL;  Surgeon: Yetta Flock, MD;  Location: [place];  Service: Gastroenterology;  Laterality: N/A;   ENTEROSCOPY N/A 01/17/2020   Procedure: ENTEROSCOPY;  Surgeon: Jackquline Denmark, MD;  Location: [facility] ENDOSCOPY;  Service: Endoscopy;  Laterality: N/A;   ESOPHAGOGASTRODUODENOSCOPY (EGD) WITH PROPOFOL N/A 08/20/2018   Procedure: ESOPHAGOGASTRODUODENOSCOPY (EGD) WITH PROPOFOL;  Surgeon: Gatha Mayer, MD;  Location: [place];  Service: Endoscopy;  Laterality: N/A;   FEMORAL-POPLITEAL BYPASS GRAFT Left 04/11/2018   Procedure: BYPASS GRAFT FEMORAL-POPLITEAL ARTERY LEFT LEG;  Surgeon: Rosetta Posner, MD;  Location: [facility] OR;  Service: Vascular;  Laterality: Left;   HOT HEMOSTASIS N/A 08/20/2018   Procedure: HOT HEMOSTASIS (ARGON PLASMA COAGULATION/BICAP);  Surgeon: Gatha Mayer, MD;  Location: [facility] ENDOSCOPY;  Service: Endoscopy;  Laterality: N/A;   HOT HEMOSTASIS N/A 01/17/2020   Procedure: HOT HEMOSTASIS (ARGON PLASMA COAGULATION/BICAP);  Surgeon: Jackquline Denmark, MD;  Location: [facility] ENDOSCOPY;  Service: Endoscopy;  Laterality: N/A;   LEFT HEART CATH AND CORONARY ANGIOGRAPHY N/A 03/29/2018   Procedure: LEFT HEART CATH AND CORONARY ANGIOGRAPHY;  Surgeon: Nelva Bush, MD;  Location: [place] CV LAB;  Service: Cardiovascular;  Laterality: N/A;   LIVER BIOPSY     ORIF ULNAR FRACTURE Left 05/18/2017   Procedure: OPEN REDUCTION INTERNAL FIXATION (ORIF) ULNAR FRACTURE;  Surgeon: Iran Planas, MD;  Location: [place];  Service: Orthopedics;  Laterality: Left;   PERIPHERAL VASCULAR  INTERVENTION Right 06/15/2020   Procedure: PERIPHERAL VASCULAR INTERVENTION;  Surgeon: Waynetta Sandy, MD;  Location: [place] CV LAB;  Service: Cardiovascular;  Laterality: Right;   POLYPECTOMY  08/21/2018   Procedure: POLYPECTOMY;  Surgeon: Yetta Flock, MD;  Location: [place];  Service: Gastroenterology;;   Earney Mallet N/A 12/11/2012   Procedure: Earney Mallet;  Surgeon: Serafina Mitchell, MD;  [facility] C[facility];  Service: Cardiovascular;  Laterality: N/A;   WOUND EXPLORATION Left 06/15/2020   Procedure: GROIN  EXPLORATION;  Surgeon: Waynetta Sandy, MD;  Location: [place];  Service: Vascular;  Laterality: Left;    Prior to Admission medications   Medication Sig Start Date End Date Taking? Authorizing Provider  Methoxy PEG-Epoetin Beta (MIRCERA IJ) Mircera 07/09/20 07/08/21 Yes [provider]  albuterol (VENTOLIN HFA) 108 (90 Base) MCG/ACT inhaler Inhale 2 puffs into the lungs every 4 (four) hours  as needed for shortness of breath.  11/06/19   [provider]  amiodarone (PACERONE) 200 MG tablet Take 1 tablet (200 mg total) by mouth daily. Take two tablets once a day for 7 days. Starting on 06/30/20 take only one tablet daily 06/30/20   Baglia, Corrina, PA-C  amLODipine (NORVASC) 2.5 MG tablet Take 2.5 mg by mouth at bedtime.  01/01/20   [provider]  apixaban (ELIQUIS) 5 MG TABS tablet Take 1 tablet (5 mg total) by mouth 2 (two) times daily. 06/22/20   Baglia, Corrina, PA-C  bismuth subsalicylate (PEPTO BISMOL) 262 MG chewable tablet Chew 524 mg by mouth as needed for indigestion or diarrhea or loose stools.    [provider]  clopidogrel (PLAVIX) 75 MG tablet Take 1 tablet (75 mg total) by mouth daily. 06/23/20   Baglia, Corrina, PA-C  oxyCODONE (OXY IR/ROXICODONE) 5 MG immediate release tablet Take 5 mg by mouth 3 (three) times daily as needed. 07/01/20   [provider]  oxyCODONE-acetaminophen (PERCOCET) 5-325  MG tablet Take 1 tablet by mouth every 6 (six) hours as needed for severe pain. 07/01/20 07/01/21  Gabriel Earing, PA-C  pantoprazole (PROTONIX) 40 MG tablet Take 1 tablet (40 mg total) by mouth daily. 01/18/20   Ghimire, Henreitta Leber, MD  QUEtiapine (SEROQUEL) 100 MG tablet Take 100 mg by mouth at bedtime as needed (sleep).  01/01/20   [provider]  rosuvastatin (CRESTOR) 10 MG tablet Take 1 tablet (10 mg total) by mouth daily. 06/23/20   Baglia, Corrina, PA-C  sevelamer carbonate (RENVELA) 800 MG tablet Take 4 tablets (3,200 mg total) by mouth 3 (three) times daily with meals. Patient taking differently: Take 4,000 mg by mouth See admin instructions. Take 4000 mg with each meal and snack 09/14/18   Monica Becton, MD    Scheduled Meds:  sodium chloride   Intravenous Once   amiodarone  200 mg Oral Daily   pantoprazole (PROTONIX) IV  40 mg Intravenous Q12H   sterile water (preservative free)       Infusions:  PRN Meds: acetaminophen **OR** acetaminophen, dextrose   Allergies as of 07/28/2020   (No Known Allergies)    Family History  Problem Relation Age of Onset   Diabetes Father    Hypertension Father    Heart disease Father     Social History   Socioeconomic History   Marital status: Single    Spouse name: Not on file   Number of children: Not on file   Years of education: Not on file   Highest education level: Not on file  Occupational History   Not on file  Tobacco Use   Smoking status: Current Every Day Smoker    Packs/day: 0.50    Years: 25.00    Pack years: 12.50    Types: Cigarettes    Last attempt to quit: 08/01/2011    Years since quitting: 9.0   Smokeless tobacco: Never Used   Tobacco comment: he smokes 6 cigarettes a week  Vaping Use   Vaping Use: Never used  Substance and Sexual Activity   Alcohol use: Yes    Comment: occasionally/ one half pint   Drug use: Not Currently    Types: Cocaine   Sexual activity: Not on  file  Other Topics Concern   Not on file  Social History Narrative   Pt. Is homeless.  He lives in shelters and motels when he can afford.     Social Determinants of Health  Financial Resource Strain: Not on file  Food Insecurity: Not on file  Transportation Needs: Not on file  Physical Activity: Not on file  Stress: Not on file  Social Connections: Not on file  Intimate Partner Violence: Not on file    REVIEW OF SYSTEMS: Constitutional: Denies profound weakness or fatigue but generally low energy. ENT:  No nose bleeds Pulm: Denies shortness of breath and cough CV: Not aware of any tachycardia.  Has pinpoint, nonexertional chest pain in the upper right chest that is not new. GU:  No hematuria, no frequency GI: See HPI Heme: Denies unusual or excessive bleeding or bruising Transfusions: See HPI Neuro:  No headaches, no peripheral tingling or numbness.  No syncope, no seizures. Derm:  No itching, no rash or sores.  Endocrine:  No sweats or chills.  No polyuria or dysuria Immunization: Not queried   PHYSICAL EXAM: Vital signs in last 24 hours: Vitals:   07/29/20 0508 07/29/20 0600  BP: (!) 137/104 (!) 155/92  Pulse: (!) 114 (!) 118  Resp: 15 14  Temp: 98.9 F (37.2 C)   SpO2: 100% 100%   Wt Readings from Last 3 Encounters:  07/28/20 74.3 kg  07/01/20 74.3 kg  06/22/20 73.9 kg    General: Chronically ill appearing but alert and comfortable. Head: No facial asymmetry or swelling.  No signs of head trauma. Eyes: No conjunctival pallor.  No scleral icterus. Ears: Not hard of hearing Nose: No congestion or discharge Mouth: Moist, pink, clear oropharynx.  Tongue midline. Neck: No JVD, masses, thyromegaly.  Soft, buoyant, non-tender swelling at bil lateral base of neck, no crepitus.   Lungs: No labored breathing or cough. Heart: Tacky into 120s on telemetry monitor. Abdomen: Soft, nontender, nondistended.  No HSM, masses, bruits, hernias..   Rectal: Formed stool  palpable.  Scant specimen looks dark brown.  Was not hemocculted Musc/Skeltl: No joint swelling or deformity. Extremities: Feet are warm.  No peripheral edema. Neurologic: Oriented x3.  Moves all 4 limbs.  No tremors. Skin: No rash, no sores, no suspicious lesions on incomplete Derm survey. Nodes: No cervical adenopathy Psych: Pleasant, cooperative, calm, fluid speech.  Intake/Output from previous day: 01/18 0701 - 01/19 0700 In: 365 [I.V.:50; Blood:315] Out: -  Intake/Output this shift: No intake/output data recorded.  LAB RESULTS: Recent Labs    07/28/20 1539 07/28/20 1624 07/28/20 1837 07/29/20 0330 07/29/20 0506  WBC 1.4*  --   --   --  2.2*  HGB 5.6*   < > 6.1* 6.9* 6.8*  HCT 17.4*   < > 18.0* 21.0* 21.9*  PLT 121*  --   --   --  117*   < > = values in this interval not displayed.   BMET Lab Results  Component Value Date   NA 141 07/29/2020   NA 140 07/28/2020   NA 141 07/28/2020   K 5.1 07/29/2020   K 3.9 07/28/2020   K 3.8 07/28/2020   CL 99 07/29/2020   CL 97 (L) 07/28/2020   CL 96 (L) 07/28/2020   CO2 24 07/29/2020   CO2 27 07/28/2020   CO2 26 06/20/2020   GLUCOSE 77 07/29/2020   GLUCOSE 72 07/28/2020   GLUCOSE 77 07/28/2020   BUN 78 (H) 07/29/2020   BUN 72 (H) 07/28/2020   BUN 67 (H) 07/28/2020   CREATININE 13.65 (H) 07/29/2020   CREATININE 12.60 (H) 07/28/2020   CREATININE 11.53 (H) 07/28/2020   CALCIUM 9.7 07/29/2020   CALCIUM 8.6 (L) 07/28/2020  CALCIUM 9.3 06/20/2020   LFT Recent Labs    07/28/20 1539 07/29/20 0506  PROT 6.9 6.2*  ALBUMIN 3.1* 2.9*  AST 8* 9*  ALT 9 9  ALKPHOS 54 50  BILITOT 0.4 0.9  BILIDIR 0.1  --   IBILI 0.3  --    PT/INR Lab Results  Component Value Date   INR 1.5 (H) 07/29/2020   INR 1.5 (H) 07/28/2020   INR 1.23 08/19/2018   Hepatitis Panel No results for input(s): HEPBSAG, HCVAB, HEPAIGM, HEPBIGM in the last 72 hours. C-Diff No components found for: CDIFF Lipase     Component Value Date/Time    LIPASE 54 (H) 12/24/2018 1318    Drugs of Abuse     Component Value Date/Time   LABOPIA NONE DETECTED 05/07/2010 1547   COCAINSCRNUR NONE DETECTED 05/07/2010 1547   LABBENZ NONE DETECTED 05/07/2010 1547   AMPHETMU NONE DETECTED 05/07/2010 1547   THCU NONE DETECTED 05/07/2010 1547   LABBARB  05/07/2010 1547    NONE DETECTED        DRUG SCREEN FOR MEDICAL PURPOSES ONLY.  IF CONFIRMATION IS NEEDED FOR ANY PURPOSE, NOTIFY LAB WITHIN 5 DAYS.        LOWEST DETECTABLE LIMITS FOR URINE DRUG SCREEN Drug Class       Cutoff (ng/mL) Amphetamine      1000 Barbiturate      200 Benzodiazepine   027 Tricyclics       253 Opiates          300 Cocaine          300 THC              50     RADIOLOGY STUDIES: CT Head Wo Contrast  Result Date: 07/28/2020 CLINICAL DATA:  Delirium. EXAM: CT HEAD WITHOUT CONTRAST TECHNIQUE: Contiguous axial images were obtained from the base of the skull through the vertex without intravenous contrast. COMPARISON:  CT head May 28, 2019 FINDINGS: Brain: No evidence of acute large vascular territory infarction, hemorrhage, hydrocephalus, extra-axial collection or mass lesion/mass effect. Mild patchy white matter hypodensities, most likely related to chronic microvascular ischemic disease. Vascular: Calcific atherosclerosis. No hyperdense vessel identified. Skull: No acute fracture. Sinuses/Orbits: Visualized sinuses are clear. Remote left medial orbital wall fracture. Other: No mastoid effusions. IMPRESSION: No evidence of acute intracranial abnormality. Electronically Signed   By: Margaretha Sheffield MD   On: 07/28/2020 16:03   DG Chest Portable 1 View  Result Date: 07/28/2020 CLINICAL DATA:  Chest pain x1 day. EXAM: PORTABLE CHEST 1 VIEW COMPARISON:  June 20, 2020 FINDINGS: The cardiac silhouette is markedly enlarged. Moderate severity calcification of the aortic arch is seen. Both lungs are clear. The visualized skeletal structures are unremarkable. IMPRESSION:  Cardiomegaly without evidence of acute or active cardiopulmonary disease. Electronically Signed   By: Virgina Norfolk M.D.   On: 07/28/2020 16:32      IMPRESSION:   *   Recurrent blood loss anemia in patient with history of colonic and small bowel AVMs eradicated with APC twice within the last 2 years.  *    Chronic Eliquis, chronic Plavix.  Latest doses 1/18.  Indications are PAF and peripheral vascular disease requiring lower extremity bypass and stenting. Tachycardia to 150s during HD yesterday, currently in the 120s..  *     Thrombocytopenia.  Chronic.  Currently noncritical levels.  *    ESRD with noncompliance with HD.  *     Hepatitis C, eradicated.  No evidence of  cirrhosis by imaging and no evidence of sequela of cirrhosis by recent upper endoscopy.  *    COVID-19 positive.  Unremarkable lungs per CXR  *   AMS, negative head CT.  By exam today delirium is resolved.    PLAN:     *   SBE, ? w placement of VCE camera pill?, tomorrow.   Discussed with patient and he is agreeable to proceed.  *   Clear diet ok.  Protonix 40 mg po q day per home dosing.    *    Hold Eliquis and Plavix as doing.   Azucena Freed  07/29/2020, 8:23 AM Phone [phone]  I have reviewed the entire case in detail with the above APP and discussed the plan in detail.  Therefore, I agree with the diagnoses recorded above. In addition,  I have personally interviewed and examined the patient and have personally reviewed any abdominal/pelvic CT scan images. I also reviewed his previous endoscopic reports by Drs. Armbruster and Lyndel Safe.  My additional thoughts are as follows:  Complex patient with multiple chronic medical problems admitted with recurrent GI bleeding causing acute on chronic anemia. End-stage renal disease on hemodialysis (he is not consistently compliant with that), peripheral arterial disease with recent stent placement (question drug-eluting, it is not clear from the recent  catheterization report), anemia of chronic disease, atrial fibrillation on oral anticoagulation, known gastric and small bowel AVMs.  Probable recurrent gastric or small bowel AVM bleeding.  Our plan is for a small bowel enteroscopy tomorrow and possible ablation of any AVMs that are discovered.  We will then place a small bowel video capsule out of concern that he may have more distal small bowel AVMs or other bleeding sources. He was agreeable after discussion of procedure and risks.  The benefits and risks of the planned procedure were described in detail with the patient or (when appropriate) their health care proxy.  Risks were outlined as including, but not limited to, bleeding, infection, perforation, adverse medication reaction leading to cardiac or pulmonary decompensation, pancreatitis (if ERCP).  The limitation of incomplete mucosal visualization was also discussed.  No guarantees or warranties were given.  Patient at increased risk for cardiopulmonary complications of procedure due to medical comorbidities.  His last dose of Eliquis was yesterday, so tomorrow should be enough washout to perform the endoscopic procedure with less risk of bleeding from endoscopic intervention.  He still has Plavix on board, and it has been held for now, but okay to proceed with endoscopy tomorrow.  I do not think we can wait the full 5 days of Plavix washout given the patient's GI bleeding.  Decision on resuming Plavix and Eliquis pending results of procedure tomorrow.  Bowel preparation required this evening for tomorrow's planned video capsule study.  Currently his potassium and bicarb are normal, he will be due for dialysis tomorrow and most likely have to be in the afternoon since he is COVID-positive.  He seems to have minimal or no COVID respiratory symptoms, no pulmonary infiltrates on today's chest x-ray.  Timing of her endoscopic procedure may need to change as we work around patient's dialysis  schedule.   [name] Office:[phone]

## 2020-07-29 NOTE — Progress Notes (Signed)
HOSPITAL MEDICINE OVERNIGHT EVENT NOTE    Notified by nursing that repeat hemoglobin this AM is found to be 6.8.    Per nursing there have been no further episodes of gross bleeding.  Patient is hemodynamically stable.  Patient is not short of breath and is on room air.  Will order 1 additional unit of PRBC's.   Raymond Emerald  MD [facility]

## 2020-07-29 NOTE — ED Notes (Signed)
Date and time results received: 07/29/20 0631   Test: CBC Critical Value: hemoglobin 6.8  Name of Provider Notified: Inda Merlin MD

## 2020-07-29 NOTE — ED Notes (Signed)
Changed rate to 135. Pt stated that he is doing well, pulling meds from pyxis

## 2020-07-29 NOTE — ED Notes (Signed)
Patient's sister called to get an update if possible, Kenney Houseman [phone]

## 2020-07-29 NOTE — ED Notes (Signed)
Lunch Tray Ordered @ [id number]

## 2020-07-30 DIAGNOSIS — D649 Anemia, unspecified: Secondary | ICD-10-CM | POA: Diagnosis not present

## 2020-07-30 LAB — BPAM RBC
Blood Product Expiration Date: 202201232359
Blood Product Expiration Date: 202201252359
ISSUE DATE / TIME: 202201181830
ISSUE DATE / TIME: 202201190949
Unit Type and Rh: 7300
Unit Type and Rh: 7300

## 2020-07-30 LAB — TYPE AND SCREEN
ABO/RH(D): B POS
Antibody Screen: NEGATIVE
Unit division: 0
Unit division: 0

## 2020-07-30 LAB — CBC
HCT: 26.8 % — ABNORMAL LOW (ref 39.0–52.0)
HCT: 26.3 % — ABNORMAL LOW (ref 39.0–52.0)
Hemoglobin: 8.7 g/dL — ABNORMAL LOW (ref 13.0–17.0)
Hemoglobin: 8.4 g/dL — ABNORMAL LOW (ref 13.0–17.0)
MCH: 32.8 pg (ref 26.0–34.0)
MCH: 32.2 pg (ref 26.0–34.0)
MCHC: 32.5 g/dL (ref 30.0–36.0)
MCHC: 31.9 g/dL (ref 30.0–36.0)
MCV: 101.1 fL — ABNORMAL HIGH (ref 80.0–100.0)
MCV: 100.8 fL — ABNORMAL HIGH (ref 80.0–100.0)
Platelets: 120 10*3/uL — ABNORMAL LOW (ref 150–400)
Platelets: 98 10*3/uL — ABNORMAL LOW (ref 150–400)
RBC: 2.65 MIL/uL — ABNORMAL LOW (ref 4.22–5.81)
RBC: 2.61 MIL/uL — ABNORMAL LOW (ref 4.22–5.81)
RDW: 23.9 % — ABNORMAL HIGH (ref 11.5–15.5)
RDW: 23.1 % — ABNORMAL HIGH (ref 11.5–15.5)
WBC: 2.3 10*3/uL — ABNORMAL LOW (ref 4.0–10.5)
WBC: 2 10*3/uL — ABNORMAL LOW (ref 4.0–10.5)
nRBC: 0 % (ref 0.0–0.2)
nRBC: 0 % (ref 0.0–0.2)

## 2020-07-30 LAB — RENAL FUNCTION PANEL
Albumin: 2.9 g/dL — ABNORMAL LOW (ref 3.5–5.0)
Anion gap: 19 — ABNORMAL HIGH (ref 5–15)
BUN: 84 mg/dL — ABNORMAL HIGH (ref 8–23)
CO2: 24 mmol/L (ref 22–32)
Calcium: 9.7 mg/dL (ref 8.9–10.3)
Chloride: 99 mmol/L (ref 98–111)
Creatinine, Ser: 15.36 mg/dL — ABNORMAL HIGH (ref 0.61–1.24)
GFR, Estimated: 3 mL/min — ABNORMAL LOW (ref >60)
Glucose, Bld: 78 mg/dL (ref 70–99)
Phosphorus: 9.3 mg/dL — ABNORMAL HIGH (ref 2.5–4.6)
Potassium: 4.9 mmol/L (ref 3.5–5.1)
Sodium: 142 mmol/L (ref 135–145)

## 2020-07-30 LAB — GLUCOSE, CAPILLARY: Glucose-Capillary: 83 mg/dL (ref 70–99)

## 2020-07-30 LAB — HEPATITIS B SURFACE ANTIGEN: Hepatitis B Surface Ag: NONREACTIVE

## 2020-07-30 LAB — PATHOLOGIST SMEAR REVIEW

## 2020-07-30 MED ORDER — SENNOSIDES-DOCUSATE SODIUM 8.6-50 MG PO TABS: 1.0000 | ORAL_TABLET | Freq: Every evening | ORAL | Status: DC | PRN

## 2020-07-30 MED ORDER — ALBUTEROL SULFATE HFA 108 (90 BASE) MCG/ACT IN AERS: 2.0000 | INHALATION_SPRAY | RESPIRATORY_TRACT | Status: DC | PRN

## 2020-07-30 MED ORDER — QUETIAPINE FUMARATE 100 MG PO TABS
100.0000 mg | ORAL_TABLET | Freq: Every evening | ORAL | Status: DC | PRN
  Administered 2020-07-31: 100 mg via ORAL
  Filled 2020-07-30: qty 1

## 2020-07-30 MED ORDER — ROSUVASTATIN CALCIUM 5 MG PO TABS
10.0000 mg | ORAL_TABLET | Freq: Every day | ORAL | Status: DC
  Administered 2020-07-30 – 2020-08-03 (×4): 10 mg via ORAL
  Filled 2020-07-30 (×4): qty 2

## 2020-07-30 MED ORDER — ACETAMINOPHEN 325 MG PO TABS: 650.0000 mg | ORAL_TABLET | Freq: Four times a day (QID) | ORAL | Status: DC | PRN

## 2020-07-30 MED ORDER — PEG-KCL-NACL-NASULF-NA ASC-C 100 G PO SOLR
0.5000 | Freq: Once | ORAL | Status: AC
  Administered 2020-07-30: 100 g via ORAL
  Filled 2020-07-30: qty 1

## 2020-07-30 MED ORDER — DM-GUAIFENESIN ER 30-600 MG PO TB12: 1.0000 | ORAL_TABLET | Freq: Two times a day (BID) | ORAL | Status: DC | PRN

## 2020-07-30 NOTE — Progress Notes (Signed)
New Admission Note:   Arrival Method: stretcher from ED Mental Orientation: Alert and oriented x4 Telemetry: 5M01, CCMD notified Assessment: to be completed Skin: Intact, warm and dry IV: RFA & RAC ,nsl Pain: 0/10 Tubes: None Safety Measures: Safety Fall Prevention Plan has been discussed  Admission: to be completed 5 [place] Orientation: Patient has been oriented to the room, unit and staff.   Family: none at bedside  Orders to be reviewed and implemented. Will continue to monitor the patient. Call light has been placed within reach and bed alarm has been activated.

## 2020-07-30 NOTE — Progress Notes (Addendum)
EGD and SBE postponed to 1 PM tomorrow because pt ate some pancakes at breakfast .   Reordered NPO after 10 pm, clears today, 1 liter moviprep this PM all ordered.  Back up orders to RN to call GI in case another provider changes po orders. HD session this afternoon.     Hgb 5.6 >> 8.7.   Platelets 120  Sarah Gribbin PA-C.     _______________  Also ordered CBC this evening and tomorrow AM  H. Loletha Carrow, MD

## 2020-07-30 NOTE — Progress Notes (Signed)
Raymond [name] Raymond [name] Progress Note  Subjective: seen on HD, no c/o's today  Vitals:   07/30/20 1140 07/30/20 1157 07/30/20 1232 07/30/20 1624  BP: (!) 119/57 118/69 (!) 144/97 121/74  Pulse:   95 (!) 108  Resp: '18  16 16  ' Temp:  98.3 F (36.8 C) 98.2 F (36.8 C) 98 F (36.7 C)  TempSrc:  Oral Oral Oral  SpO2: 98%  100% 95%  Weight:      Height:        Exam: alert, nad , ox 3  no jvd  Chest cta bilat  Cor reg no RG  Abd soft ntnd no ascites   Ext no LE edema   Alert, NF, ox3   LUA AVF+bruit     Home meds:  - amiodarone/ norvasc / eliquis/ plavix/ crestor  - seroquel/ percocet prn  - protonix / renvlea 4 tabs ac tid  - prn's/ vitamins/ supplements  CXR - IMPRESSION: Cardiomegaly without evidence of acute or active cardiopulmonary disease.    OP HD: TTS  4h  450 /800  69.5kg  2/2 bath P4  AVF  Hep none  - sensipar 180 tiw  - mircera 200 ug q2, last 12/30  - calcitriol 1.5ug tiw po  - had 3h HD yest after missing 1 week   Assessment/ Plan: 1. GI bleed / anemia - HB 6.1 > 8.7.  GI planning scope for tomorrow.  2. COVID infection - CXR neg, on RA 3. ESRD - on HD TTS. Missed 1 week of HD. HD today then Sat.  4. AMS - uremia from missed HD, covid and/or other. Better before we dialyzed him today here. Back to baseline.  5. Atrial fib - on amio 6. PAD - on asa / plavix 7. HTN - cont home med norvasc 8. Anemia ckd - due for esa, ordered 200ug darbe q wed sq 9. MBD ckd - cont vdra , sensipar and binder      Raymond [name] 07/30/2020, 4:48 PM   Recent Labs  Lab 07/29/20 0506 07/29/20 0900 07/30/20 0711  K 5.1  --  4.9  BUN 78*  --  84*  CREATININE 13.65*  --  15.36*  CALCIUM 9.7  --  9.7  PHOS 7.7*  --  9.3*  HGB 6.8* 7.1* 8.7*   Inpatient medications: . amiodarone  200 mg Oral Daily  . amLODipine  5 mg Oral Daily  . calcitRIOL  1.5 mcg Oral Q T,Th,Sa-HD  . cinacalcet  180 mg Oral Q T,Th,Sa-HD  . darbepoetin (ARANESP) injection -  NON-DIALYSIS  200 mcg Subcutaneous Q Wed-1800  . pantoprazole  40 mg Oral Q0600  . peg 3350 powder  0.5 kit Oral Once  . rosuvastatin  10 mg Oral Daily    acetaminophen, albuterol, dextromethorphan-guaiFENesin, dextrose, QUEtiapine, senna-docusate

## 2020-07-30 NOTE — Progress Notes (Signed)
PROGRESS NOTE    Raymond [name]  ZDG:[id number] DOB: 1956/04/12 DOA: 07/28/2020 PCP: [name], FNP   Brief Narrative:  65 year old with history of P A. fib on Eliquis, hep C, ESRD Monday Wednesday Friday, CAD, PVD s/p femoropopliteal bypass, GI bleed from AVM was sent from his dialysis due to tachycardia and drowsiness.  In the hospital he was found to have a hemoglobin of 6.1 therefore transfusion was ordered.  GI team was consulted with plans for endoscopic evaluation   Assessment & Plan:   Principal Problem:   Acute on chronic anemia Active Problems:   ESRD (end stage renal disease) (HCC)   Hypoglycemia   Acute upper GI bleed   Acute metabolic encephalopathy   COVID-19 virus infection   Acute blood loss anemia, source GI, a.m. labs pending Chronic pancytopenia-leukopenia/thrombocytopenia -GI consulted, plans for endoscopic evaluation with video capsule placement.  This has been postponed because patient was on a renal diet yesterday and ate pancakes this morning. -Hold Eliquis and Plavix  Acute metabolic encephalopathy - Improved  Elevated troponin - From demand ischemia.  Supportive care.  ESRD TTS - Nephrology consulted. -Calcitriol, Sensipar, Aranesp weekly  History of paroxysmal atrial fibrillation Essential hypertension - Eliquis on hold.  On amiodarone. -Norvasc 5 mg daily  COVID-19, asymptomatic - Continue airborne and contact precautions.  Chest x-ray is clear.  He has had both of his vaccines and booster.  Symptomatic management.  History of hepatitis C status post interferon treatment  PAD status post left femoropopliteal bypass -follows outpatient vascular.  On Plavix and statin at home  DVT prophylaxis: SCDs Code Status: Full code Family Communication:  Sister called; no answer.   Status is: Inpatient  Remains inpatient appropriate because:Inpatient level of care appropriate due to severity of illness   Dispo: The patient is from:  Home              Anticipated d/c is to: Home              Anticipated d/c date is: 2 days              Patient currently is not medically stable to d/c.  Plans for endoscopic evaluation tomorrow       Body mass index is 24.91 kg/m.      Subjective: Feels ok, no complaints. Ate pancakes this morning.  No acute events overnight.   Review of Systems Otherwise negative except as per HPI, including: General = no fevers, chills, dizziness,  fatigue HEENT/EYES = negative for loss of vision, double vision, blurred vision,  sore throa Cardiovascular= negative for chest pain, palpitation Respiratory/lungs= negative for shortness of breath, cough, wheezing; hemoptysis,  Gastrointestinal= negative for nausea, vomiting, abdominal pain Genitourinary= negative for Dysuria MSK = Negative for arthralgia, myalgias Neurology= Negative for headache, numbness, tingling  Psychiatry= Negative for suicidal and homocidal ideation Skin= Negative for Rash   Examination:  Constitutional: Not in acute distress Respiratory: Clear to auscultation bilaterally Cardiovascular: Normal sinus rhythm, no rubs Abdomen: Nontender nondistended good bowel sounds Musculoskeletal: No edema noted Skin: No rashes seen Neurologic: CN 2-12 grossly intact.  And nonfocal Psychiatric: Normal judgment and insight. Alert and oriented x 3. Normal mood.    Objective: Vitals:   07/29/20 2100 07/29/20 2323 07/29/20 2327 07/30/20 0413  BP: (!) 155/125 (!) 155/105  (!) 136/111  Pulse: (!) 108 93  (!) 106  Resp: (!) 21 18    Temp:  97.7 F (36.5 C)  98.5 F (36.9  C)  TempSrc:  Oral  Oral  SpO2: 96% (!) 83% 97% 99%  Weight:      Height:        Intake/Output Summary (Last 24 hours) at 07/30/2020 0757 Last data filed at 07/30/2020 0300 Gross per 24 hour  Intake 2230 ml  Output --  Net 2230 ml   Filed Weights   07/28/20 1539  Weight: 74.3 kg     Data Reviewed:   CBC: Recent Labs  Lab 07/28/20 1539  07/28/20 1624 07/28/20 1837 07/29/20 0330 07/29/20 0506 07/29/20 0900  WBC 1.4*  --   --   --  2.2*  --   NEUTROABS 1.1*  --   --   --  1.3*  --   HGB 5.6* 5.8* 6.1* 6.9* 6.8* 7.1*  HCT 17.4* 17.0* 18.0* 21.0* 21.9* 22.9*  MCV 107.4*  --   --   --  105.8*  --   PLT 121*  --   --   --  117*  --    Basic Metabolic Panel: Recent Labs  Lab 07/28/20 1539 07/28/20 1624 07/28/20 1837 07/29/20 0506  NA 141 141 140 141  K 3.6 3.8 3.9 5.1  CL 96*  --  97* 99  CO2 27  --   --  24  GLUCOSE 77  --  72 77  BUN 67*  --  72* 78*  CREATININE 11.53*  --  12.60* 13.65*  CALCIUM 8.6*  --   --  9.7  MG  --   --   --  2.0  PHOS  --   --   --  7.7*   GFR: Estimated Creatinine Clearance: 5.2 mL/min (A) (by C-G formula based on SCr of 13.65 mg/dL (H)). Liver Function Tests: Recent Labs  Lab 07/28/20 1539 07/29/20 0506  AST 8* 9*  ALT 9 9  ALKPHOS 54 50  BILITOT 0.4 0.9  PROT 6.9 6.2*  ALBUMIN 3.1* 2.9*   No results for input(s): LIPASE, AMYLASE in the last 168 hours. Recent Labs  Lab 07/28/20 1539  AMMONIA 28   Coagulation Profile: Recent Labs  Lab 07/28/20 1710 07/29/20 0506  INR 1.5* 1.5*   Cardiac Enzymes: No results for input(s): CKTOTAL, CKMB, CKMBINDEX, TROPONINI in the last 168 hours. BNP (last 3 results) No results for input(s): PROBNP in the last 8760 hours. HbA1C: No results for input(s): HGBA1C in the last 72 hours. CBG: Recent Labs  Lab 07/28/20 1527 07/28/20 1601 07/29/20 0327 07/29/20 0505 07/29/20 0816  GLUCAP 61* 126* 76 80 76   Lipid Profile: No results for input(s): CHOL, HDL, LDLCALC, TRIG, CHOLHDL, LDLDIRECT in the last 72 hours. Thyroid Function Tests: Recent Labs    07/29/20 0506  TSH 4.833*   Anemia Panel: Recent Labs    07/29/20 0506  FOLATE 8.3  FERRITIN 484*  TIBC 295  IRON 153  RETICCTPCT 1.3   Sepsis Labs: Recent Labs  Lab 07/28/20 1539 07/29/20 0506  PROCALCITON  --  0.48  LATICACIDVEN 1.9  --     Recent Results  (from the past 240 hour(s))  Resp Panel by RT-PCR (Flu A&B, Covid) Nasopharyngeal Swab     Status: Abnormal   Collection Time: 07/28/20  3:46 PM   Specimen: Nasopharyngeal Swab; Nasopharyngeal(NP) swabs in vial transport medium  Result Value Ref Range Status   SARS Coronavirus 2 by RT PCR POSITIVE (A) NEGATIVE Final    Comment: RESULT CALLED TO, READ BACK BY AND VERIFIED WITH: T SHROPSHIRE RN [phone]  07/28/20 A BROWNING (NOTE) SARS-CoV-2 target nucleic acids are DETECTED.  The SARS-CoV-2 RNA is generally detectable in upper respiratory specimens during the acute phase of infection. Positive results are indicative of the presence of the identified virus, but do not rule out bacterial infection or co-infection with other pathogens not detected by the test. Clinical correlation with patient history and other diagnostic information is necessary to determine patient infection status. The expected result is Negative.  Fact Sheet for Patients: BloggerCourse.com  Fact Sheet for Healthcare Providers: SeriousBroker.it  This test is not yet approved or cleared by the [place] FDA and  has been authorized for detection and/or diagnosis of SARS-CoV-2 by FDA under an Emergency Use Authorization (EUA).  This EUA will remain in effect (meaning this test  can be used) for the duration of  the COVID-19 declaration under Section 564(b)(1) of the Act, 21 U.S.C. section 360bbb-3(b)(1), unless the authorization is terminated or revoked sooner.     Influenza A by PCR NEGATIVE NEGATIVE Final   Influenza B by PCR NEGATIVE NEGATIVE Final    Comment: (NOTE) The Xpert Xpress SARS-CoV-2/FLU/RSV plus assay is intended as an aid in the diagnosis of influenza from Nasopharyngeal swab specimens and should not be used as a sole basis for treatment. Nasal washings and aspirates are unacceptable for Xpert Xpress SARS-CoV-2/FLU/RSV testing.  Fact Sheet for  Patients: BloggerCourse.com  Fact Sheet for Healthcare Providers: SeriousBroker.it  This test is not yet approved or cleared by the [place] FDA and has been authorized for detection and/or diagnosis of SARS-CoV-2 by FDA under an Emergency Use Authorization (EUA). This EUA will remain in effect (meaning this test can be used) for the duration of the COVID-19 declaration under Section 564(b)(1) of the Act, 21 U.S.C. section 360bbb-3(b)(1), unless the authorization is terminated or revoked.  Performed at [facility], [address], [place], [place] [address]          Radiology Studies: CT Head Wo Contrast  Result Date: 07/28/2020 CLINICAL DATA:  Delirium. EXAM: CT HEAD WITHOUT CONTRAST TECHNIQUE: Contiguous axial images were obtained from the base of the skull through the vertex without intravenous contrast. COMPARISON:  CT head May 28, 2019 FINDINGS: Brain: No evidence of acute large vascular territory infarction, hemorrhage, hydrocephalus, extra-axial collection or mass lesion/mass effect. Mild patchy white matter hypodensities, most likely related to chronic microvascular ischemic disease. Vascular: Calcific atherosclerosis. No hyperdense vessel identified. Skull: No acute fracture. Sinuses/Orbits: Visualized sinuses are clear. Remote left medial orbital wall fracture. Other: No mastoid effusions. IMPRESSION: No evidence of acute intracranial abnormality. Electronically Signed   By: Feliberto Harts MD   On: 07/28/2020 16:03   DG Chest Portable 1 View  Result Date: 07/28/2020 CLINICAL DATA:  Chest pain x1 day. EXAM: PORTABLE CHEST 1 VIEW COMPARISON:  June 20, 2020 FINDINGS: The cardiac silhouette is markedly enlarged. Moderate severity calcification of the aortic arch is seen. Both lungs are clear. The visualized skeletal structures are unremarkable. IMPRESSION: Cardiomegaly without evidence of acute or active  cardiopulmonary disease. Electronically Signed   By: Aram Candela M.D.   On: 07/28/2020 16:32        Scheduled Meds: . amiodarone  200 mg Oral Daily  . amLODipine  5 mg Oral Daily  . calcitRIOL  1.5 mcg Oral Q T,Th,Sa-HD  . cinacalcet  180 mg Oral Q T,Th,Sa-HD  . darbepoetin (ARANESP) injection - NON-DIALYSIS  200 mcg Subcutaneous Q Wed-1800  . pantoprazole  40 mg Oral Q0600  Continuous Infusions:   LOS: 2 days   Time spent= 35 mins    Earlisha Sharples Joline Maxcy, MD [facility]  If 7PM-7AM, please contact night-coverage  07/30/2020, 7:57 AM

## 2020-07-30 NOTE — Anesthesia Preprocedure Evaluation (Addendum)
Anesthesia Evaluation  Patient identified by MRN, date of birth, ID band Patient awake    Reviewed: Allergy & Precautions, NPO status , Patient's Chart, lab work & pertinent test results  Airway Mallampati: II  TM Distance: >3 FB Neck ROM: Full    Dental  (+) Poor Dentition   Pulmonary shortness of breath, pneumonia (COVID +), COPD, Current Smoker and Patient abstained from smoking.,  13 pack year history    Pulmonary exam normal        Cardiovascular hypertension, + CAD and + Peripheral Vascular Disease  Normal cardiovascular exam     Neuro/Psych negative neurological ROS  negative psych ROS   GI/Hepatic (+)     substance abuse  , Hepatitis - (treated 2018), C  Endo/Other    Renal/GU ESRF and DialysisRenal diseaseESRD [facility] Missed HD for a week prior to 1/18- got 3 out of 4h of HD that time     Musculoskeletal negative musculoskeletal ROS (+)   Abdominal   Peds  Hematology  (+) Blood dyscrasia, anemia , H/h 7.1/22.9, plt 117   Anesthesia Other Findings GIB was sent to ED from his HD unit yest 1/18 due to AMS  Reproductive/Obstetrics negative OB ROS                            Anesthesia Physical Anesthesia Plan  ASA: IV  Anesthesia Plan: MAC   Post-op Pain Management:    Induction:   PONV Risk Score and Plan: 2 and Propofol infusion and TIVA  Airway Management Planned: Natural Airway and Simple Face Mask  Additional Equipment: None  Intra-op Plan:   Post-operative Plan:   Informed Consent: I have reviewed the patients History and Physical, chart, labs and discussed the procedure including the risks, benefits and alternatives for the proposed anesthesia with the patient or authorized representative who has indicated his/her understanding and acceptance.     Dental advisory given  Plan Discussed with: CRNA  Anesthesia Plan Comments:        Anesthesia Quick  Evaluation

## 2020-07-30 NOTE — Plan of Care (Signed)
  Problem: Clinical Measurements: Goal: Ability to maintain clinical measurements within normal limits will improve Outcome: Progressing   Problem: Safety: Goal: Ability to remain free from injury will improve Outcome: Progressing   

## 2020-07-30 NOTE — H&P (View-Only) (Signed)
EGD and SBE postponed to 1 PM tomorrow because pt ate some pancakes at breakfast .   Reordered NPO after 10 pm, clears today, 1 liter moviprep this PM all ordered.  Back up orders to RN to call GI in case another provider changes po orders. HD session this afternoon.     Hgb 5.6 >> 8.7.   Platelets 120  Sarah Gribbin PA-C.     _______________  Also ordered CBC this evening and tomorrow AM  H. Loletha Carrow, MD

## 2020-07-31 ENCOUNTER — Encounter (HOSPITAL_COMMUNITY): Payer: Self-pay | Admitting: Internal Medicine

## 2020-07-31 ENCOUNTER — Encounter (HOSPITAL_COMMUNITY)
Admission: EM | Disposition: A | Discharge status: Home Health | Payer: Self-pay | Source: Home / Self Care | Attending: Internal Medicine

## 2020-07-31 ENCOUNTER — Inpatient Hospital Stay (HOSPITAL_COMMUNITY): Payer: 59 | Admitting: Certified Registered Nurse Anesthetist

## 2020-07-31 DIAGNOSIS — D500 Iron deficiency anemia secondary to blood loss (chronic): Secondary | ICD-10-CM | POA: Diagnosis not present

## 2020-07-31 DIAGNOSIS — D649 Anemia, unspecified: Secondary | ICD-10-CM | POA: Diagnosis not present

## 2020-07-31 DIAGNOSIS — K921 Melena: Secondary | ICD-10-CM | POA: Diagnosis not present

## 2020-07-31 HISTORY — PX: GIVENS CAPSULE STUDY: SHX5432

## 2020-07-31 HISTORY — PX: ENTEROSCOPY: SHX5533

## 2020-07-31 LAB — GLUCOSE, CAPILLARY
Glucose-Capillary: 67 mg/dL — ABNORMAL LOW (ref 70–99)
Glucose-Capillary: 67 mg/dL — ABNORMAL LOW (ref 70–99)
Glucose-Capillary: 69 mg/dL — ABNORMAL LOW (ref 70–99)
Glucose-Capillary: 70 mg/dL (ref 70–99)
Glucose-Capillary: 71 mg/dL (ref 70–99)
Glucose-Capillary: 72 mg/dL (ref 70–99)
Glucose-Capillary: 93 mg/dL (ref 70–99)
Glucose-Capillary: 99 mg/dL (ref 70–99)

## 2020-07-31 LAB — CBC
HCT: 32.5 % — ABNORMAL LOW (ref 39.0–52.0)
Hemoglobin: 10.1 g/dL — ABNORMAL LOW (ref 13.0–17.0)
MCH: 32 pg (ref 26.0–34.0)
MCHC: 31.1 g/dL (ref 30.0–36.0)
MCV: 102.8 fL — ABNORMAL HIGH (ref 80.0–100.0)
Platelets: 107 10*3/uL — ABNORMAL LOW (ref 150–400)
RBC: 3.16 MIL/uL — ABNORMAL LOW (ref 4.22–5.81)
RDW: 22.6 % — ABNORMAL HIGH (ref 11.5–15.5)
WBC: 2.4 10*3/uL — ABNORMAL LOW (ref 4.0–10.5)
nRBC: 0 % (ref 0.0–0.2)

## 2020-07-31 LAB — METHYLMALONIC ACID, SERUM: Methylmalonic Acid, Quantitative: 474 nmol/L — ABNORMAL HIGH (ref 0–378)

## 2020-07-31 LAB — MAGNESIUM: Magnesium: 1.9 mg/dL (ref 1.7–2.4)

## 2020-07-31 SURGERY — ENTEROSCOPY
Anesthesia: Monitor Anesthesia Care

## 2020-07-31 MED ORDER — DEXAMETHASONE SODIUM PHOSPHATE 10 MG/ML IJ SOLN
INTRAMUSCULAR | Status: DC | PRN
  Administered 2020-07-31: 5 mg via INTRAVENOUS

## 2020-07-31 MED ORDER — DEXTROSE 50 % IV SOLN
INTRAVENOUS | Status: AC
  Filled 2020-07-31: qty 50

## 2020-07-31 MED ORDER — PROPOFOL 500 MG/50ML IV EMUL
INTRAVENOUS | Status: DC | PRN
  Administered 2020-07-31: 100 ug/kg/min via INTRAVENOUS

## 2020-07-31 MED ORDER — METOCLOPRAMIDE HCL 5 MG/ML IJ SOLN
5.0000 mg | Freq: Four times a day (QID) | INTRAMUSCULAR | Status: AC
  Administered 2020-07-31: 5 mg via INTRAVENOUS
  Filled 2020-07-31: qty 1
  Filled 2020-07-31: qty 2
  Filled 2020-07-31: qty 1

## 2020-07-31 MED ORDER — SEVELAMER CARBONATE 800 MG PO TABS
3200.0000 mg | ORAL_TABLET | Freq: Three times a day (TID) | ORAL | Status: DC
  Administered 2020-08-01 – 2020-08-03 (×8): 3200 mg via ORAL
  Filled 2020-07-31 (×7): qty 4

## 2020-07-31 MED ORDER — CHLORHEXIDINE GLUCONATE CLOTH 2 % EX PADS
6.0000 | MEDICATED_PAD | Freq: Every day | CUTANEOUS | Status: DC
  Administered 2020-08-01: 6 via TOPICAL

## 2020-07-31 MED ORDER — SODIUM CHLORIDE 0.9 % IV SOLN: INTRAVENOUS | Status: DC | PRN

## 2020-07-31 MED ORDER — PHENYLEPHRINE HCL-NACL 10-0.9 MG/250ML-% IV SOLN
INTRAVENOUS | Status: DC | PRN
  Administered 2020-07-31: 20 ug/min via INTRAVENOUS

## 2020-07-31 MED ORDER — GLUCOSE 40 % PO GEL
ORAL | Status: AC
  Filled 2020-07-31: qty 1

## 2020-07-31 MED ORDER — PROPOFOL 10 MG/ML IV BOLUS
INTRAVENOUS | Status: DC | PRN
  Administered 2020-07-31 (×2): 40 mg via INTRAVENOUS

## 2020-07-31 SURGICAL SUPPLY — 1 items: TOWEL COTTON PACK 4EA (MISCELLANEOUS) ×4 IMPLANT

## 2020-07-31 NOTE — Op Note (Addendum)
[facility] Patient Name: Raymond [name] Procedure Date : 07/31/2020 MRN: [id number] Attending MD: Starr Lake. 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 Myrtie Neither, MD Referring MD:             [facility] Medicines:                Monitored Anesthesia Care Complications:            No immediate complications. Estimated Blood Loss:     Estimated blood loss: none. Procedure:                Pre-Anesthesia Assessment:                           - Prior to the procedure, a History and Physical                            was performed, and patient medications and                            allergies were reviewed. The patient's tolerance of                            previous anesthesia was also reviewed. The risks                            and benefits of the procedure and the sedation                            options and risks were discussed with the patient.                            All questions were answered, and informed consent                            was obtained. Prior Anticoagulants: The patient has                            taken Eliquis (apixaban), last dose was 3 days                            prior to procedure. ASA Grade Assessment: IV - A  patient with severe systemic disease that is a                            constant threat to life. After reviewing the risks                            and benefits, the patient was deemed in                            satisfactory condition to undergo the procedure.                           After obtaining informed consent, the endoscope was                             passed under direct vision. Throughout the                            procedure, the patient's blood pressure, pulse, and                            oxygen saturations were monitored continuously. The                            [id number]-H190 ([id number] Olympus gastroscope was                            introduced through the mouth and advanced to the                            proximal jejunum. The [id number]-[id number] ([id number]) Olympus                            pediatric colonoscope was introduced through the                            mouth and advanced to the proximal jejunum. The                            small bowel enteroscopy was accomplished without                            difficulty. The patient tolerated the procedure. Scope In: Scope Out: Findings:      The esophagus was normal.      The stomach was normal.      The examined duodenum was normal.      There was no evidence of significant pathology in the entire examined       portion of jejunum.      After removal of the pediatric colonoscope, preparations were made to       pass a standard adult EGD scope with a capsule delivery device. However,       this proved prohibitively difficult to navigate through the pharynx with       the limitation it coveys on visualization as well as  patient coughing. Impression:               - Normal esophagus.                           - Normal stomach.                           - Normal examined duodenum.                           - The examined portion of the jejunum was normal.                           - No specimens collected.                           (Note - system-wide technical problems with                            Provation precluded photographs) Recommendation:           - Return patient to hospital ward for ongoing care.                           - Patient will ingest video capsule today after                            recovering from sedation. Procedure Code(s):         --- Professional ---                           [phone], Small intestinal endoscopy, enteroscopy                            beyond second portion of duodenum, not including                            ileum; diagnostic, including collection of                            specimen(s) by brushing or washing, when performed                            (separate procedure) Diagnosis Code(s):        --- Professional ---                           D50.0, Iron deficiency anemia secondary to blood                            loss (chronic)                           K92.1, Melena (includes Hematochezia) CPT copyright 2019 American Medical Association. All rights reserved. The codes documented in this report are preliminary and upon coder review may  be revised to meet current compliance requirements. Aiya Keach L. Danis,  MD 07/31/2020 4:26:03 PM This report has been signed electronically. Number of Addenda: 0

## 2020-07-31 NOTE — Interval H&P Note (Signed)
History and Physical Interval Note:  07/31/2020 3:41 PM  Raymond [name]  has presented today for surgery, with the diagnosis of gi bleed, anemia, hx SB AVMs.  The various methods of treatment have been discussed with the patient and family. After consideration of risks, benefits and other options for treatment, the patient has consented to  Procedure(s): ENTEROSCOPY (N/A) GIVENS CAPSULE STUDY (N/A) as a surgical intervention.  The patient's history has been reviewed, patient examined, no change in status, stable for surgery.  I have reviewed the patient's chart and labs.  Questions were answered to the patient's satisfaction.    Hgb 10.1 Patient states ready to proceed with small bowel enteroscopy and endoscopic placement of video capsule.  [name]

## 2020-07-31 NOTE — Progress Notes (Signed)
PROGRESS NOTE    Raymond [name]  JYN:[id number] DOB: February 18, 1956 DOA: 07/28/2020 PCP: [name], FNP   Brief Narrative:  65 year old with history of P A. fib on Eliquis, hep C, ESRD Monday Wednesday Friday, CAD, PVD s/p femoropopliteal bypass, GI bleed from AVM was sent from his dialysis due to tachycardia and drowsiness.  In the hospital he was found to have a hemoglobin of 6.1 therefore transfusion was ordered.  GI team was consulted with plans for endoscopic evaluation.   Assessment & Plan:   Principal Problem:   Acute on chronic anemia Active Problems:   ESRD (end stage renal disease) (HCC)   Hypoglycemia   Acute upper GI bleed   Acute metabolic encephalopathy   COVID-19 virus infection   Acute blood loss anemia, source GI, a.m. labs pending Chronic pancytopenia-leukopenia/thrombocytopenia -GI consulted, plans for endoscopic evaluation with video capsule placement.  This is planned for later this afternoon. -Hold Eliquis and Plavix  Acute metabolic encephalopathy - Improved.  Currently at baseline.  Elevated troponin - From demand ischemia.  Supportive care.  ESRD TTS - Nephrology team following. -Calcitriol, Sensipar, Aranesp weekly  History of paroxysmal atrial fibrillation Essential hypertension - Eliquis on hold.  On amiodarone. -Norvasc 5 mg daily  COVID-19, asymptomatic - Continue airborne and contact precautions.  Chest x-ray is clear.  He has had both of his vaccines and booster.  Symptomatic management as it arises  History of hepatitis C status post interferon treatment  PAD status post left femoropopliteal bypass -follows outpatient vascular.  On Plavix and statin at home  DVT prophylaxis: SCDs Code Status: Full code Family Communication:  Sister called again on 1/21- no answer.   Status is: Inpatient  Remains inpatient appropriate because:Inpatient level of care appropriate due to severity of illness   Dispo: The patient is from: Home               Anticipated d/c is to: Home              Anticipated d/c date is: 2 days              Patient currently is not medically stable to d/c.  Plans for endoscopic evaluation tomorrow       Body mass index is 24.91 kg/m.      Subjective: Laying in his bed, no complaints. Feels ok.   Review of Systems Otherwise negative except as per HPI, including: General = no fevers, chills, dizziness,  fatigue HEENT/EYES = negative for loss of vision, double vision, blurred vision,  sore throa Cardiovascular= negative for chest pain, palpitation Respiratory/lungs= negative for shortness of breath, cough, wheezing; hemoptysis,  Gastrointestinal= negative for nausea, vomiting, abdominal pain Genitourinary= negative for Dysuria MSK = Negative for arthralgia, myalgias Neurology= Negative for headache, numbness, tingling  Psychiatry= Negative for suicidal and homocidal ideation Skin= Negative for Rash    Examination:  Constitutional: Not in acute distress Respiratory: Clear to auscultation bilaterally Cardiovascular: Normal sinus rhythm, no rubs Abdomen: Nontender nondistended good bowel sounds Musculoskeletal: No edema noted Skin: No rashes seen Neurologic: CN 2-12 grossly intact.  And nonfocal Psychiatric: Normal judgment and insight. Alert and oriented x 3. Normal mood.  LUE Fistula.    Objective: Vitals:   07/30/20 1232 07/30/20 1624 07/30/20 1947 07/31/20 0612  BP: (!) 144/97 121/74 (!) 135/94 (!) 142/86  Pulse: 95 (!) 108 87 95  Resp: 16 16 18 18   Temp: 98.2 F (36.8 C) 98 F (36.7 C) 98.4 F (36.9  C) 97.7 F (36.5 C)  TempSrc: Oral Oral Oral Oral  SpO2: 100% 95% 100% 100%  Weight:      Height:        Intake/Output Summary (Last 24 hours) at 07/31/2020 1109 Last data filed at 07/31/2020 0900 Gross per 24 hour  Intake 2376 ml  Output 2000 ml  Net 376 ml   Filed Weights   07/28/20 1539  Weight: 74.3 kg     Data Reviewed:   CBC: Recent Labs  Lab  07/28/20 1539 07/28/20 1624 07/29/20 0506 07/29/20 0900 07/30/20 0711 07/30/20 1614 07/31/20 0413  WBC 1.4*  --  2.2*  --  2.3* 2.0* 2.4*  NEUTROABS 1.1*  --  1.3*  --   --   --   --   HGB 5.6*   < > 6.8* 7.1* 8.7* 8.4* 10.1*  HCT 17.4*   < > 21.9* 22.9* 26.8* 26.3* 32.5*  MCV 107.4*  --  105.8*  --  101.1* 100.8* 102.8*  PLT 121*  --  117*  --  120* 98* 107*   < > = values in this interval not displayed.   Basic Metabolic Panel: Recent Labs  Lab 07/28/20 1539 07/28/20 1624 07/28/20 1837 07/29/20 0506 07/30/20 0711 07/31/20 0413  NA 141 141 140 141 142 142  K 3.6 3.8 3.9 5.1 4.9 4.5  CL 96*  --  97* 99 99 100  CO2 27  --   --  24 24 26   GLUCOSE 77  --  72 77 78 81  BUN 67*  --  72* 78* 84* 31*  CREATININE 11.53*  --  12.60* 13.65* 15.36* 9.18*  CALCIUM 8.6*  --   --  9.7 9.7 8.7*  MG  --   --   --  2.0  --  1.9  PHOS  --   --   --  7.7* 9.3*  --    GFR: Estimated Creatinine Clearance: 7.7 mL/min (A) (by C-G formula based on SCr of 9.18 mg/dL (H)). Liver Function Tests: Recent Labs  Lab 07/28/20 1539 07/29/20 0506 07/30/20 0711  AST 8* 9*  --   ALT 9 9  --   ALKPHOS 54 50  --   BILITOT 0.4 0.9  --   PROT 6.9 6.2*  --   ALBUMIN 3.1* 2.9* 2.9*   No results for input(s): LIPASE, AMYLASE in the last 168 hours. Recent Labs  Lab 07/28/20 1539  AMMONIA 28   Coagulation Profile: Recent Labs  Lab 07/28/20 1710 07/29/20 0506  INR 1.5* 1.5*   Cardiac Enzymes: No results for input(s): CKTOTAL, CKMB, CKMBINDEX, TROPONINI in the last 168 hours. BNP (last 3 results) No results for input(s): PROBNP in the last 8760 hours. HbA1C: No results for input(s): HGBA1C in the last 72 hours. CBG: Recent Labs  Lab 07/30/20 1623 07/31/20 0006 07/31/20 0044 07/31/20 0613 07/31/20 1107  GLUCAP 83 67* 99 70 71   Lipid Profile: No results for input(s): CHOL, HDL, LDLCALC, TRIG, CHOLHDL, LDLDIRECT in the last 72 hours. Thyroid Function Tests: Recent Labs     07/29/20 0506  TSH 4.833*   Anemia Panel: Recent Labs    07/29/20 0506  FOLATE 8.3  FERRITIN 484*  TIBC 295  IRON 153  RETICCTPCT 1.3   Sepsis Labs: Recent Labs  Lab 07/28/20 1539 07/29/20 0506  PROCALCITON  --  0.48  LATICACIDVEN 1.9  --     Recent Results (from the past 240 hour(s))  Resp Panel by RT-PCR (  Flu A&B, Covid) Nasopharyngeal Swab     Status: Abnormal   Collection Time: 07/28/20  3:46 PM   Specimen: Nasopharyngeal Swab; Nasopharyngeal(NP) swabs in vial transport medium  Result Value Ref Range Status   SARS Coronavirus 2 by RT PCR POSITIVE (A) NEGATIVE Final    Comment: RESULT CALLED TO, READ BACK BY AND VERIFIED WITH: T SHROPSHIRE RN 1708 07/28/20 A BROWNING (NOTE) SARS-CoV-2 target nucleic acids are DETECTED.  The SARS-CoV-2 RNA is generally detectable in upper respiratory specimens during the acute phase of infection. Positive results are indicative of the presence of the identified virus, but do not rule out bacterial infection or co-infection with other pathogens not detected by the test. Clinical correlation with patient history and other diagnostic information is necessary to determine patient infection status. The expected result is Negative.  Fact Sheet for Patients: BloggerCourse.com  Fact Sheet for Healthcare Providers: SeriousBroker.it  This test is not yet approved or cleared by the [place] FDA and  has been authorized for detection and/or diagnosis of SARS-CoV-2 by FDA under an Emergency Use Authorization (EUA).  This EUA will remain in effect (meaning this test  can be used) for the duration of  the COVID-19 declaration under Section 564(b)(1) of the Act, 21 U.S.C. section 360bbb-3(b)(1), unless the authorization is terminated or revoked sooner.     Influenza A by PCR NEGATIVE NEGATIVE Final   Influenza B by PCR NEGATIVE NEGATIVE Final    Comment: (NOTE) The Xpert Xpress  SARS-CoV-2/FLU/RSV plus assay is intended as an aid in the diagnosis of influenza from Nasopharyngeal swab specimens and should not be used as a sole basis for treatment. Nasal washings and aspirates are unacceptable for Xpert Xpress SARS-CoV-2/FLU/RSV testing.  Fact Sheet for Patients: BloggerCourse.com  Fact Sheet for Healthcare Providers: SeriousBroker.it  This test is not yet approved or cleared by the [place] FDA and has been authorized for detection and/or diagnosis of SARS-CoV-2 by FDA under an Emergency Use Authorization (EUA). This EUA will remain in effect (meaning this test can be used) for the duration of the COVID-19 declaration under Section 564(b)(1) of the Act, 21 U.S.C. section 360bbb-3(b)(1), unless the authorization is terminated or revoked.  Performed at [facility], [address] [place], [place] [address]          Radiology Studies: No results found.      Scheduled Meds:  amiodarone  200 mg Oral Daily   amLODipine  5 mg Oral Daily   calcitRIOL  1.5 mcg Oral Q T,Th,Sa-HD   cinacalcet  180 mg Oral Q T,Th,Sa-HD   darbepoetin (ARANESP) injection - NON-DIALYSIS  200 mcg Subcutaneous Q Wed-1800   pantoprazole  40 mg Oral Q0600   rosuvastatin  10 mg Oral Daily   sevelamer carbonate  3,200 mg Oral TID WC   Continuous Infusions:   LOS: 3 days   Time spent= 35 mins    Dezmond Downie Joline Maxcy, MD [facility]  If 7PM-7AM, please contact night-coverage  07/31/2020, 11:09 AM

## 2020-07-31 NOTE — Progress Notes (Signed)
[facility] Progress Note  Subjective:   last HD on 1/20 with 2 kg UF.  Feels ok.  States sometimes misses binders at home but tells me he takes six renvela with meals.   Review of systems:  Has been NPO for procedure  Denies n/v Denies shortness of breath or chest pain   Vitals:   07/30/20 1232 07/30/20 1624 07/30/20 1947 07/31/20 0612  BP: (!) 144/97 121/74 (!) 135/94 (!) 142/86  Pulse: 95 (!) 108 87 95  Resp: 16 16 18 18   Temp: 98.2 F (36.8 C) 98 F (36.7 C) 98.4 F (36.9 C) 97.7 F (36.5 C)  TempSrc: Oral Oral Oral Oral  SpO2: 100% 95% 100% 100%  Weight:      Height:        Exam:  Adult male in bed in NAD NCAT sclera anicteric  Chest cta bilat on room air  S1S2 no rub  Abd soft ntnd    Ext no LE edema appreciated   Alert, NF, ox3 and conversant  Psych normal mood and affect Access   LUA AVF+bruit    Home meds:  - amiodarone/ norvasc / eliquis/ plavix/ crestor  - seroquel/ percocet prn  - protonix / renvlea 4 tabs ac tid  - prn's/ vitamins/ supplements  CXR - IMPRESSION: Cardiomegaly without evidence of acute or active cardiopulmonary disease.    OP HD: TTS  4h  450 /800  69.5kg  2/2 bath P4  AVF  Hep none  - sensipar 180 tiw  - mircera 200 ug q2, last 12/30  - calcitriol 1.5ug tiw po  - had 3h HD yest after missing 1 week   Assessment/ Plan: 1. GI bleed with acute blood loss anemia. GI consulted. plavix and eliquis on hold per primary 2. COVID infection - per primary team  3. ESRD - on HD TTS 4. AMS - uremia from missed HD, covid. Back to baseline per charting 5. Atrial fib - on amio per primary team  6. HTN - acceptable control  7. Anemia ckd - aranesp 200 mcg ordered every wed   8. MBD ckd - cont vdra , sensipar and add back renvela for when taking PO - will start with four renvela and titrate     Recent Labs  Lab 07/29/20 0506 07/29/20 0900 07/30/20 0711 07/30/20 1614 07/31/20 0413  K 5.1  --  4.9  --  4.5   BUN 78*  --  84*  --  31*  CREATININE 13.65*  --  15.36*  --  9.18*  CALCIUM 9.7  --  9.7  --  8.7*  PHOS 7.7*  --  9.3*  --   --   HGB 6.8*   < > 8.7* 8.4* 10.1*   < > = values in this interval not displayed.   Inpatient medications: . amiodarone  200 mg Oral Daily  . amLODipine  5 mg Oral Daily  . calcitRIOL  1.5 mcg Oral Q T,Th,Sa-HD  . cinacalcet  180 mg Oral Q T,Th,Sa-HD  . darbepoetin (ARANESP) injection - NON-DIALYSIS  200 mcg Subcutaneous Q Wed-1800  . pantoprazole  40 mg Oral Q0600  . rosuvastatin  10 mg Oral Daily    acetaminophen, albuterol, dextromethorphan-guaiFENesin, dextrose, QUEtiapine, senna-docusate   Claudia Desanctis, MD 07/31/2020  9:55 AM

## 2020-07-31 NOTE — Transfer of Care (Signed)
Immediate Anesthesia Transfer of Care Note  Patient: Raymond [name]  Procedure(s) Performed: ENTEROSCOPY (N/A ) GIVENS CAPSULE STUDY (N/A )  Patient Location: recovering pt in Endo 1 d/t + COVID infection  Anesthesia Type:MAC  Level of Consciousness: awake and alert   Airway & Oxygen Therapy: Patient Spontanous Breathing and Patient connected to nasal cannula oxygen  Post-op Assessment: Report given to RN and Post -op Vital signs reviewed and stable  Post vital signs: Reviewed and stable  Last Vitals:  Vitals Value Taken Time  BP 115/66 07/31/20 16:48  Temp 36.1 07/31/20 16:48  Pulse 101 07/31/20 16:48  Resp 16 07/31/20 16:48  SpO2 96 07/31/20 16:48    Last Pain:  Vitals:   07/31/20 1527  TempSrc: Temporal  PainSc: 0-No pain         Complications: No complications documented.

## 2020-07-31 NOTE — Plan of Care (Signed)

## 2020-07-31 NOTE — Progress Notes (Signed)
Hypoglycemic Event  CBG: 67 mg/dL @ 0006  Treatment: given 2 cups apple juice and dextrose 40% oral gel  Symptoms: feeling hungry  Follow-up CBG: Time:0044 CBG Result: 99  Possible Reasons for Event: patient on clear liquid  Comments/MD notified: no    Raymond [name]

## 2020-07-31 NOTE — Plan of Care (Signed)

## 2020-07-31 NOTE — Anesthesia Procedure Notes (Signed)
Procedure Name: MAC Date/Time: 07/31/2020 3:24 PM Performed by: Reece Agar, CRNA Pre-anesthesia Checklist: Patient identified, Emergency Drugs available, Suction available, Patient being monitored and Timeout performed Patient Re-evaluated:Patient Re-evaluated prior to induction Oxygen Delivery Method: Nasal cannula

## 2020-08-01 DIAGNOSIS — D649 Anemia, unspecified: Secondary | ICD-10-CM | POA: Diagnosis not present

## 2020-08-01 DIAGNOSIS — K5521 Angiodysplasia of colon with hemorrhage: Secondary | ICD-10-CM | POA: Diagnosis not present

## 2020-08-01 DIAGNOSIS — K921 Melena: Secondary | ICD-10-CM | POA: Diagnosis not present

## 2020-08-01 LAB — BASIC METABOLIC PANEL
Anion gap: 16 — ABNORMAL HIGH (ref 5–15)
Anion gap: 17 — ABNORMAL HIGH (ref 5–15)
BUN: 31 mg/dL — ABNORMAL HIGH (ref 8–23)
BUN: 47 mg/dL — ABNORMAL HIGH (ref 8–23)
CO2: 26 mmol/L (ref 22–32)
CO2: 22 mmol/L (ref 22–32)
Calcium: 8.7 mg/dL — ABNORMAL LOW (ref 8.9–10.3)
Calcium: 8 mg/dL — ABNORMAL LOW (ref 8.9–10.3)
Chloride: 100 mmol/L (ref 98–111)
Chloride: 101 mmol/L (ref 98–111)
Creatinine, Ser: 9.18 mg/dL — ABNORMAL HIGH (ref 0.61–1.24)
Creatinine, Ser: 12.41 mg/dL — ABNORMAL HIGH (ref 0.61–1.24)
GFR, Estimated: 6 mL/min — ABNORMAL LOW (ref >60)
GFR, Estimated: 4 mL/min — ABNORMAL LOW (ref >60)
Glucose, Bld: 81 mg/dL (ref 70–99)
Glucose, Bld: 112 mg/dL — ABNORMAL HIGH (ref 70–99)
Potassium: 4.5 mmol/L (ref 3.5–5.1)
Potassium: 4.4 mmol/L (ref 3.5–5.1)
Sodium: 142 mmol/L (ref 135–145)
Sodium: 140 mmol/L (ref 135–145)

## 2020-08-01 LAB — CBC
HCT: 27.8 % — ABNORMAL LOW (ref 39.0–52.0)
Hemoglobin: 9.1 g/dL — ABNORMAL LOW (ref 13.0–17.0)
MCH: 33.5 pg (ref 26.0–34.0)
MCHC: 32.7 g/dL (ref 30.0–36.0)
MCV: 102.2 fL — ABNORMAL HIGH (ref 80.0–100.0)
Platelets: 123 10*3/uL — ABNORMAL LOW (ref 150–400)
RBC: 2.72 MIL/uL — ABNORMAL LOW (ref 4.22–5.81)
RDW: 21.4 % — ABNORMAL HIGH (ref 11.5–15.5)
WBC: 5.3 10*3/uL (ref 4.0–10.5)
nRBC: 0 % (ref 0.0–0.2)

## 2020-08-01 LAB — MAGNESIUM: Magnesium: 1.9 mg/dL (ref 1.7–2.4)

## 2020-08-01 LAB — GLUCOSE, CAPILLARY
Glucose-Capillary: 88 mg/dL (ref 70–99)
Glucose-Capillary: 121 mg/dL — ABNORMAL HIGH (ref 70–99)
Glucose-Capillary: 107 mg/dL — ABNORMAL HIGH (ref 70–99)
Glucose-Capillary: 165 mg/dL — ABNORMAL HIGH (ref 70–99)

## 2020-08-01 NOTE — Progress Notes (Signed)
Renal Navigator notes recommendation for HH at discharge and called patient to inquire about how he transports to HD and ensure that he is aware of COVID isolation shift. Patient will need to dialyze in isolation at discharge on a TTS schedule 3rd shift for at least 14 days from positive test (07/28/20). Navigator spoke with patient who states he uses Medicaid transportation. He will not be able to use this while COVID positive. Navigator asked to contact his sisters to discuss. He states, "it won't do any good. They don't deal with me now." He added that they work, one doesn't drive and the other lives in [place]. He assures Navigator that neither sister would be able to assist. Navigator does not think that [facility] will transport given patient's Medicare advantage plan, but [facility] can evaluate this if possible. Navigator will request transport through [employer] [facility]. Perhaps [facility] could assist, but Navigator does not think this would be possible given the sheer amount of COVID positive patients needing transport at this time and that patient is going to a private residence/not SNF. At this time, patient does not have a safe discharge plan given transportation barrier to outpatient HD.  Navigator following.  Alphonzo Cruise, [place] Navigator [phone]

## 2020-08-01 NOTE — Progress Notes (Signed)
[facility] Progress Note  Chief Complaint: Melena, acute blood loss anemia  History: No bowel movement for last 2 days, and no melena or bright red blood during that time.  Patient has been tolerating regular diet since yesterday after his video capsule study He denies abdominal pain chest pain or dyspnea  Objective:   Current Facility-Administered Medications:  .  acetaminophen (TYLENOL) tablet 650 mg, 650 mg, Oral, Q6H PRN, Danis, Miquela Costabile L III, MD .  albuterol (VENTOLIN HFA) 108 (90 Base) MCG/ACT inhaler 2 puff, 2 puff, Inhalation, Q4H PRN, [name], MD .  amiodarone (PACERONE) tablet 200 mg, 200 mg, Oral, Daily, Danis, Estill Cotta III, MD, 200 mg at 08/01/20 1100 .  amLODipine (NORVASC) tablet 5 mg, 5 mg, Oral, Daily, Danis, Estill Cotta III, MD, 5 mg at 07/30/20 1225 .  calcitRIOL (ROCALTROL) capsule 1.5 mcg, 1.5 mcg, Oral, Q T,Th,Sa-HD, [name], MD, 1.5 mcg at 08/01/20 1506 .  Chlorhexidine Gluconate Cloth 2 % PADS 6 each, 6 each, Topical, Q0600, Doran Stabler, MD, 6 each at 08/01/20 0509 .  cinacalcet (SENSIPAR) tablet 180 mg, 180 mg, Oral, Q T,Th,Sa-HD, [name], MD, 180 mg at 08/01/20 1505 .  Darbepoetin Alfa (ARANESP) injection 200 mcg, 200 mcg, Subcutaneous, Q Wed-1800, [name], MD, 200 mcg at 07/29/20 2101 .  dextromethorphan-guaiFENesin (MUCINEX DM) 30-600 MG per 12 hr tablet 1 tablet, 1 tablet, Oral, BID PRN, Danis, Rumi Kolodziej L III, MD .  dextrose 50 % solution 50 mL, 1 ampule, Intravenous, Q2H PRN, Danis, Estill Cotta III, MD .  pantoprazole (PROTONIX) EC tablet 40 mg, 40 mg, Oral, Q0600, [name], MD, 40 mg at 08/01/20 0509 .  QUEtiapine (SEROQUEL) tablet 100 mg, 100 mg, Oral, QHS PRN, [name], MD, 100 mg at 07/31/20 2102 .  rosuvastatin (CRESTOR) tablet 10 mg, 10 mg, Oral, Daily, Danis, Estill Cotta III, MD, 10 mg at 08/01/20 1100 .  senna-docusate (Senokot-S) tablet 1 tablet, 1 tablet, Oral, QHS PRN, Danis, Estill Cotta III, MD .   sevelamer carbonate (RENVELA) tablet 3,200 mg, 3,200 mg, Oral, TID WC, Danis, Estill Cotta III, MD, 3,200 mg at 08/01/20 1508  Facility-Administered Medications Ordered in Other Encounters:  .  midazolam (VERSED) 5 MG/5ML injection, , , Anesthesia Intra-op, Sammie Bench, CRNA, 2 mg at 04/11/18 1042     Vital signs in last 24 hrs: Vitals:   08/01/20 0528 08/01/20 1259  BP: (!) 129/96 (!) 122/100  Pulse: 99 88  Resp: 18 18  Temp: 98.9 F (37.2 C) 99.1 F (37.3 C)  SpO2: 100% 98%    Intake/Output Summary (Last 24 hours) at 08/01/2020 1658 Last data filed at 08/01/2020 0900 Gross per 24 hour  Intake 240 ml  Output --  Net 240 ml     Physical Exam Chronically ill-appearing man seen on respiratory precautions.  He is breathing comfortably on room air.  HEENT: sclera anicteric, oral mucosa without lesions  Neck: supple, no thyromegaly, JVD or lymphadenopathy  Cardiac: RRR without murmurs, S1S2 heard  Pulm: clear to auscultation bilaterally, normal RR and effort noted  Abdomen: soft, no tenderness, with active bowel sounds. No guarding or palpable hepatosplenomegaly  Skin; warm and dry, no jaundice  Recent Labs:  CBC Latest Ref Rng & Units 08/01/2020 07/31/2020 07/30/2020  WBC 4.0 - 10.5 K/uL 5.3 2.4(L) 2.0(L)  Hemoglobin 13.0 - 17.0 g/dL 9.1(L) 10.1(L) 8.4(L)  Hematocrit 39.0 - 52.0 % 27.8(L) 32.5(L)  26.3(L)  Platelets 150 - 400 K/uL 123(L) 107(L) 98(L)    Recent Labs  Lab 07/29/20 0506  INR 1.5*   CMP Latest Ref Rng & Units 08/01/2020 07/31/2020 07/30/2020  Glucose 70 - 99 mg/dL 112(H) 81 78  BUN 8 - 23 mg/dL 47(H) 31(H) 84(H)  Creatinine 0.61 - 1.24 mg/dL 12.41(H) 9.18(H) 15.36(H)  Sodium 135 - 145 mmol/L 140 142 142  Potassium 3.5 - 5.1 mmol/L 4.4 4.5 4.9  Chloride 98 - 111 mmol/L 101 100 99  CO2 22 - 32 mmol/L 22 26 24   Calcium 8.9 - 10.3 mg/dL 8.0(L) 8.7(L) 9.7  Total Protein 6.5 - 8.1 g/dL - - -  Total Bilirubin 0.3 - 1.2 mg/dL - - -  Alkaline Phos 38 -  126 U/L - - -  AST 15 - 41 U/L - - -  ALT 0 - 44 U/L - - -     Radiologic studies:   Assessment & Plan  Assessment:  Melena Acute on chronic anemia, elements of chronic and acute GI blood loss on top of chronic kidney disease. Known gastric and small bowel AVMs.  None discovered on small bowel enteroscopy yesterday.  I reviewed his video capsule study earlier today, and a full copy will eventually be scanned into the chart. It was unfortunately a limited study since the scope was retained in the esophagus and then the stomach for at least 10 hours.  He then ate about 4 hours into the study, and retained food in the stomach and small bowel further limited mucosal visualization. 2 diminutive nonbleeding AVMs were seen in the distal small bowel, clearly beyond the reach of the enteroscopy. The entire small bowel was not visualized before the end of battery life.   It has been enormously challenging to identify and treat this patient's recurrent gastric and small bowel AVMs.  I had hoped to place the capsule endoscopically but was unable to due to the technical challenges of navigating the pharynx with the size of the device at fixed to the end of the upper endoscope.  He was also coughing during the procedure making it even more challenging.  Thus, he had to ingest the video capsule which then led to a limited study due to esophageal and gastric retention.  I think that is the best we will be able to do for imaging of his small bowel given those technical challenges and limitations.  Despite the limitations of the study, it demonstrates that he has distal small bowel AVMs that are probable source of his recurrent overt bleeding episodes like this admission.  This current overt bleeding episode has stopped, but he is at high risk of them recurring since he is on chronic Plavix and Eliquis.  In my opinion, his Plavix can be resumed because of his severe peripheral arterial disease.  However, I  think strong consideration should be given to at least a temporary (and perhaps permanent) discontinuation of his Eliquis.  He has distal small bowel lesions we cannot reach with the scope in order to perform intervention.  If he is off [place], perhaps he will have fewer and/or less severe episodes of overt bleeding and worsening of his chronic anemia. Of course the [facility] decreases his risk of CVA in the setting of paroxysmal A. fib, but recurrent GI bleeding leading to severe anemia also presents its risks to his health as well  Please engage in those discussions with his cardiologist.  I have no further GI testing planned at this  point, he certainly needs close attention to his hemoglobin with periodic IV iron treatments and/or transfusion in the outpatient setting.  We are signing off and will be glad to see him again as acute issues arise.  Total time 35 minutes, extensive discussion had with patient regarding these findings and my proposed plan.   [name] Office: [phone]

## 2020-08-01 NOTE — Progress Notes (Signed)
[facility] Progress Note  Subjective:   last HD on 1/20 with 2 kg UF.  He has been comfortable on room air.  States he's at [address] for HD   Review of systems:   Denies n/v Denies shortness of breath or chest pain   Vitals:   07/31/20 1703 07/31/20 1828 07/31/20 2106 08/01/20 0528  BP: (!) 124/49 (!) 153/104 (!) 173/112 (!) 129/96  Pulse: 96 100 (!) 101 99  Resp: (!) 22 20 18 18   Temp:  98.5 F (36.9 C) 98.7 F (37.1 C) 98.9 F (37.2 C)  TempSrc:  Oral Oral Oral  SpO2: 96% 97% 98% 100%  Weight:      Height:        Exam:  Adult male in bed in NAD  NCAT sclera anicteric  Chest cta bilat on room air  S1S2 no rub  Abd soft ntnd    Ext no LE edema appreciated   Alert, NF, ox3 and conversant  Psych normal mood and affect Access   LUA AVF+bruit    Home meds:  - amiodarone/ norvasc / eliquis/ plavix/ crestor  - seroquel/ percocet prn  - protonix / renvlea 4 tabs ac tid  - prn's/ vitamins/ supplements  CXR - IMPRESSION: Cardiomegaly without evidence of acute or active cardiopulmonary disease.    OP HD: TTS at [facility]/[address]  4h  450 /800  69.5kg  2/2 bath P4  AVF  Hep none  - sensipar 180 tiw  - mircera 200 ug q2, last 12/30  - calcitriol 1.5ug tiw po  - had 3h HD yest after missing 1 week   Assessment/ Plan: 1. GI bleed with acute blood loss anemia. GI consulted. plavix and eliquis on hold per primary 2. COVID infection - per primary team  3. ESRD - on HD TTS schedule - note emergent staffing issues may delay his treatment. Daily labs not yet available  4. AMS - uremia from missed HD, covid. Back to baseline per charting 5. Atrial fib - on amio per primary team  6. HTN - acceptable control  7. Anemia ckd - aranesp 200 mcg ordered every wed   8. MBD ckd - cont vdra , sensipar and added back renvela   Disposition per primary team.  Stable from a renal standpoint and will need to be on covid shift at [facility] on discharge   Recent Labs   Lab 07/29/20 0506 07/29/20 0900 07/30/20 0711 07/30/20 1614 07/31/20 0413  K 5.1  --  4.9  --  4.5  BUN 78*  --  84*  --  31*  CREATININE 13.65*  --  15.36*  --  9.18*  CALCIUM 9.7  --  9.7  --  8.7*  PHOS 7.7*  --  9.3*  --   --   HGB 6.8*   < > 8.7* 8.4* 10.1*   < > = values in this interval not displayed.   Inpatient medications: . amiodarone  200 mg Oral Daily  . amLODipine  5 mg Oral Daily  . calcitRIOL  1.5 mcg Oral Q T,Th,Sa-HD  . Chlorhexidine Gluconate Cloth  6 each Topical Q0600  . cinacalcet  180 mg Oral Q T,Th,Sa-HD  . darbepoetin (ARANESP) injection - NON-DIALYSIS  200 mcg Subcutaneous Q Wed-1800  . pantoprazole  40 mg Oral Q0600  . rosuvastatin  10 mg Oral Daily  . sevelamer carbonate  3,200 mg Oral TID WC    acetaminophen, albuterol, dextromethorphan-guaiFENesin, dextrose, QUEtiapine, senna-docusate  Claudia Desanctis, MD 08/01/2020  10:59 AM

## 2020-08-01 NOTE — Progress Notes (Signed)
PROGRESS NOTE    Raymond [name]  FFM:[id number] DOB: 16-Dec-1955 DOA: 07/28/2020 PCP: [name], FNP   Brief Narrative:  65 year old with history of P A. fib on Eliquis, hep C, ESRD Monday Wednesday Friday, CAD, PVD s/p femoropopliteal bypass, GI bleed from AVM was sent from his dialysis due to tachycardia and drowsiness.  In the hospital he was found to have a hemoglobin of 6.1 therefore transfusion was ordered.  GI team was consulted with plans for endoscopic evaluation. EGD 1/21 showed normal esophagus, stomach and duodenum.. Video capsule was ordered.   Assessment & Plan:   Principal Problem:   Acute on chronic anemia Active Problems:   ESRD (end stage renal disease) (HCC)   Hypoglycemia   Acute upper GI bleed   Acute metabolic encephalopathy   COVID-19 virus infection   Acute blood loss anemia, source GI, a.m. labs pending Chronic pancytopenia-leukopenia/thrombocytopenia -GI following. A.m. labs pending - EGD 1/21-normal esophagus stomach and duodenum - Video capsule ordered -Hold Eliquis and Plavix  Acute metabolic encephalopathy - Currently at baseline  Elevated troponin - From demand ischemia.  Supportive care.  ESRD TTS - Nephrology team following. -Calcitriol, Sensipar, Aranesp weekly  History of paroxysmal atrial fibrillation Essential hypertension - Eliquis on hold.  On amiodarone. -Norvasc 5 mg daily  COVID-19, asymptomatic - Chest x-ray is clear. He is vaccinated and boosted. Supportive care.  History of hepatitis C status post interferon treatment  PAD status post left femoropopliteal bypass -follows outpatient vascular.  On Plavix and statin at home  PT/OT ordered  DVT prophylaxis: SCDs Code Status: Full code Family Communication:  Sister called again on 1/21- no answer.   Status is: Inpatient  Remains inpatient appropriate because:Inpatient level of care appropriate due to severity of illness   Dispo: The patient is from: Home               Anticipated d/c is to: Home              Anticipated d/c date is: 1 day              Patient currently is not medically stable to d/c. Discharge once cleared by GI Body mass index is 24.33 kg/m.      Subjective: Feels okay no complaints resting in his bed.  No acute events overnight.  Review of Systems Otherwise negative except as per HPI, including: General: Denies fever, chills, night sweats or unintended weight loss. Resp: Denies cough, wheezing, shortness of breath. Cardiac: Denies chest pain, palpitations, orthopnea, paroxysmal nocturnal dyspnea. GI: Denies abdominal pain, nausea, vomiting, diarrhea or constipation GU: Denies dysuria, frequency, hesitancy or incontinence MS: Denies muscle aches, joint pain or swelling Neuro: Denies headache, neurologic deficits (focal weakness, numbness, tingling), abnormal gait Psych: Denies anxiety, depression, SI/HI/AVH Skin: Denies new rashes or lesions ID: Denies sick contacts, exotic exposures, travel   Examination: Constitutional: Not in acute distress Respiratory: Clear to auscultation bilaterally Cardiovascular: Normal sinus rhythm, no rubs Abdomen: Nontender nondistended good bowel sounds Musculoskeletal: No edema noted Skin: No rashes seen Neurologic: CN 2-12 grossly intact.  And nonfocal Psychiatric: Normal judgment and insight. Alert and oriented x 3. Normal mood.   Objective: Vitals:   07/31/20 1703 07/31/20 1828 07/31/20 2106 08/01/20 0528  BP: (!) 124/49 (!) 153/104 (!) 173/112 (!) 129/96  Pulse: 96 100 (!) 101 99  Resp: (!) 22 20 18 18   Temp:  98.5 F (36.9 C) 98.7 F (37.1 C) 98.9 F (37.2 C)  TempSrc:  Oral Oral Oral  SpO2: 96% 97% 98% 100%  Weight:      Height:        Intake/Output Summary (Last 24 hours) at 08/01/2020 0749 Last data filed at 07/31/2020 1700 Gross per 24 hour  Intake 200 ml  Output 0 ml  Net 200 ml   Filed Weights   07/28/20 1539 07/31/20 1527  Weight: 74.3 kg 72.6 kg      Data Reviewed:   CBC: Recent Labs  Lab 07/28/20 1539 07/28/20 1624 07/29/20 0506 07/29/20 0900 07/30/20 0711 07/30/20 1614 07/31/20 0413  WBC 1.4*  --  2.2*  --  2.3* 2.0* 2.4*  NEUTROABS 1.1*  --  1.3*  --   --   --   --   HGB 5.6*   < > 6.8* 7.1* 8.7* 8.4* 10.1*  HCT 17.4*   < > 21.9* 22.9* 26.8* 26.3* 32.5*  MCV 107.4*  --  105.8*  --  101.1* 100.8* 102.8*  PLT 121*  --  117*  --  120* 98* 107*   < > = values in this interval not displayed.   Basic Metabolic Panel: Recent Labs  Lab 07/28/20 1539 07/28/20 1624 07/28/20 1837 07/29/20 0506 07/30/20 0711 07/31/20 0413  NA 141 141 140 141 142 142  K 3.6 3.8 3.9 5.1 4.9 4.5  CL 96*  --  97* 99 99 100  CO2 27  --   --  24 24 26   GLUCOSE 77  --  72 77 78 81  BUN 67*  --  72* 78* 84* 31*  CREATININE 11.53*  --  12.60* 13.65* 15.36* 9.18*  CALCIUM 8.6*  --   --  9.7 9.7 8.7*  MG  --   --   --  2.0  --  1.9  PHOS  --   --   --  7.7* 9.3*  --    GFR: Estimated Creatinine Clearance: 7.7 mL/min (A) (by C-G formula based on SCr of 9.18 mg/dL (H)). Liver Function Tests: Recent Labs  Lab 07/28/20 1539 07/29/20 0506 07/30/20 0711  AST 8* 9*  --   ALT 9 9  --   ALKPHOS 54 50  --   BILITOT 0.4 0.9  --   PROT 6.9 6.2*  --   ALBUMIN 3.1* 2.9* 2.9*   No results for input(s): LIPASE, AMYLASE in the last 168 hours. Recent Labs  Lab 07/28/20 1539  AMMONIA 28   Coagulation Profile: Recent Labs  Lab 07/28/20 1710 07/29/20 0506  INR 1.5* 1.5*   Cardiac Enzymes: No results for input(s): CKTOTAL, CKMB, CKMBINDEX, TROPONINI in the last 168 hours. BNP (last 3 results) No results for input(s): PROBNP in the last 8760 hours. HbA1C: No results for input(s): HGBA1C in the last 72 hours. CBG: Recent Labs  Lab 07/31/20 1511 07/31/20 1631 07/31/20 1827 07/31/20 2133 08/01/20 0655  GLUCAP 69* 67* 72 93 88   Lipid Profile: No results for input(s): CHOL, HDL, LDLCALC, TRIG, CHOLHDL, LDLDIRECT in the last 72  hours. Thyroid Function Tests: No results for input(s): TSH, T4TOTAL, FREET4, T3FREE, THYROIDAB in the last 72 hours. Anemia Panel: No results for input(s): VITAMINB12, FOLATE, FERRITIN, TIBC, IRON, RETICCTPCT in the last 72 hours. Sepsis Labs: Recent Labs  Lab 07/28/20 1539 07/29/20 0506  PROCALCITON  --  0.48  LATICACIDVEN 1.9  --     Recent Results (from the past 240 hour(s))  Resp Panel by RT-PCR (Flu A&B, Covid) Nasopharyngeal Swab     Status: Abnormal  Collection Time: 07/28/20  3:46 PM   Specimen: Nasopharyngeal Swab; Nasopharyngeal(NP) swabs in vial transport medium  Result Value Ref Range Status   SARS Coronavirus 2 by RT PCR POSITIVE (A) NEGATIVE Final    Comment: RESULT CALLED TO, READ BACK BY AND VERIFIED WITH: T SHROPSHIRE RN 1708 07/28/20 A BROWNING (NOTE) SARS-CoV-2 target nucleic acids are DETECTED.  The SARS-CoV-2 RNA is generally detectable in upper respiratory specimens during the acute phase of infection. Positive results are indicative of the presence of the identified virus, but do not rule out bacterial infection or co-infection with other pathogens not detected by the test. Clinical correlation with patient history and other diagnostic information is necessary to determine patient infection status. The expected result is Negative.  Fact Sheet for Patients: EntrepreneurPulse.com.au  Fact Sheet for Healthcare Providers: IncredibleEmployment.be  This test is not yet approved or cleared by the [place] FDA and  has been authorized for detection and/or diagnosis of SARS-CoV-2 by FDA under an Emergency Use Authorization (EUA).  This EUA will remain in effect (meaning this test  can be used) for the duration of  the COVID-19 declaration under Section 564(b)(1) of the Act, 21 U.S.C. section 360bbb-3(b)(1), unless the authorization is terminated or revoked sooner.     Influenza A by PCR NEGATIVE NEGATIVE Final    Influenza B by PCR NEGATIVE NEGATIVE Final    Comment: (NOTE) The Xpert Xpress SARS-CoV-2/FLU/RSV plus assay is intended as an aid in the diagnosis of influenza from Nasopharyngeal swab specimens and should not be used as a sole basis for treatment. Nasal washings and aspirates are unacceptable for Xpert Xpress SARS-CoV-2/FLU/RSV testing.  Fact Sheet for Patients: EntrepreneurPulse.com.au  Fact Sheet for Healthcare Providers: IncredibleEmployment.be  This test is not yet approved or cleared by the [place] FDA and has been authorized for detection and/or diagnosis of SARS-CoV-2 by FDA under an Emergency Use Authorization (EUA). This EUA will remain in effect (meaning this test can be used) for the duration of the COVID-19 declaration under Section 564(b)(1) of the Act, 21 U.S.C. section 360bbb-3(b)(1), unless the authorization is terminated or revoked.  Performed at [facility], [place] [address], [place], [place] [address]          Radiology Studies: No results found.      Scheduled Meds: . amiodarone  200 mg Oral Daily  . amLODipine  5 mg Oral Daily  . calcitRIOL  1.5 mcg Oral Q T,Th,Sa-HD  . Chlorhexidine Gluconate Cloth  6 each Topical Q0600  . cinacalcet  180 mg Oral Q T,Th,Sa-HD  . darbepoetin (ARANESP) injection - NON-DIALYSIS  200 mcg Subcutaneous Q Wed-1800  . pantoprazole  40 mg Oral Q0600  . rosuvastatin  10 mg Oral Daily  . sevelamer carbonate  3,200 mg Oral TID WC   Continuous Infusions:   LOS: 4 days   Time spent= 35 mins    Ankit Arsenio Loader, MD [facility]  If 7PM-7AM, please contact night-coverage  08/01/2020, 7:49 AM

## 2020-08-01 NOTE — Evaluation (Signed)
Physical Therapy Evaluation Patient Details Name: Raymond [name] MRN: [id number] DOB: 12-05-1955 Today's Date: 08/01/2020   History of Present Illness  Pt is a 65 y/o male admitted secondary to SOB. Found to have low hemoglobin, likely from GI bleed. Pt is s/p capsule study. Pt is also COVID +. PMH includes ESRD on HD, a fib, CAD, and PVD.  Clinical Impression  Pt admitted secondary to problem above with deficits below. Pt with weakness and unsteadiness. Improved balance noted with use of RW. Requiring min to min guard A for mobility tasks this session. Feel pt would benefit from use of rollator to increase safety with mobility. Will continue to follow acutely.     Follow Up Recommendations Home health PT    Equipment Recommendations  Other (comment) (rollator (4 wheeled walker with seat))    Recommendations for Other Services       Precautions / Restrictions Precautions Precautions: Fall Restrictions Weight Bearing Restrictions: No      Mobility  Bed Mobility Overal bed mobility: Modified Independent                  Transfers Overall transfer level: Needs assistance Equipment used: 1 person hand held assist;Rolling walker (2 wheeled) Transfers: Sit to/from Stand Sit to Stand: Min assist;Min guard         General transfer comment: Pt requiring min A initially to stand without AD. Pt only requiring min guard to stand with RW.  Ambulation/Gait Ambulation/Gait assistance: Min assist;Min guard Gait Distance (Feet): 30 Feet Assistive device: 1 person hand held assist;Rolling walker (2 wheeled) Gait Pattern/deviations: Step-through pattern;Decreased stride length Gait velocity: Decreased   General Gait Details: Abulated laps in room with HHA. Pt with LE weakness and decreased balance. Pt with improved steadiness with use of RW. Educated about using walker at home to improve safety.  Stairs            Wheelchair Mobility    Modified Rankin (Stroke Patients  Only)       Balance Overall balance assessment: Needs assistance Sitting-balance support: No upper extremity supported Sitting balance-Leahy Scale: Good     Standing balance support: Bilateral upper extremity supported;No upper extremity supported Standing balance-Leahy Scale: Fair Standing balance comment: Able to maintain static standing without UE support                             Pertinent Vitals/Pain Pain Assessment: No/denies pain    Home Living Family/patient expects to be discharged to:: Private residence Living Arrangements: Alone Available Help at Discharge: Family;Friend(s);Available PRN/intermittently Type of Home: Apartment Home Access: Elevator     Home Layout: One level Home Equipment: Grab bars - toilet;Grab bars - tub/shower      Prior Function Level of Independence: Independent               Hand Dominance        Extremity/Trunk Assessment   Upper Extremity Assessment Upper Extremity Assessment: Defer to OT evaluation    Lower Extremity Assessment Lower Extremity Assessment: Generalized weakness    Cervical / Trunk Assessment Cervical / Trunk Assessment: Normal  Communication   Communication: No difficulties  Cognition Arousal/Alertness: Awake/alert Behavior During Therapy: WFL for tasks assessed/performed Overall Cognitive Status: Within Functional Limits for tasks assessed  General Comments      Exercises     Assessment/Plan    PT Assessment Patient needs continued PT services  PT Problem List Decreased strength;Decreased activity tolerance;Decreased balance;Decreased mobility;Decreased knowledge of use of DME;Decreased knowledge of precautions       PT Treatment Interventions Gait training;DME instruction;Therapeutic activities;Functional mobility training;Balance training;Therapeutic exercise;Patient/family education    PT Goals (Current goals can be  found in the Care Plan section)  Acute Rehab PT Goals Patient Stated Goal: to go home PT Goal Formulation: With patient Time For Goal Achievement: 08/14/20 Potential to Achieve Goals: Good    Frequency Min 3X/week   Barriers to discharge Decreased caregiver support      Co-evaluation               AM-PAC PT "6 Clicks" Mobility  Outcome Measure Help needed turning from your back to your side while in a flat bed without using bedrails?: None Help needed moving from lying on your back to sitting on the side of a flat bed without using bedrails?: None Help needed moving to and from a bed to a chair (including a wheelchair)?: A Little Help needed standing up from a chair using your arms (e.g., wheelchair or bedside chair)?: A Little Help needed to walk in hospital room?: A Little Help needed climbing 3-5 steps with a railing? : A Lot 6 Click Score: 19    End of Session Equipment Utilized During Treatment: Gait belt Activity Tolerance: Patient tolerated treatment well Patient left: in bed;with call bell/phone within reach Nurse Communication: Mobility status PT Visit Diagnosis: Muscle weakness (generalized) (M62.81);Unsteadiness on feet (R26.81)    Time: 1022-1040 PT Time Calculation (min) (ACUTE ONLY): 18 min   Charges:   PT Evaluation $PT Eval Low Complexity: 1 Low          Raymond [name], DPT  Acute Rehabilitation Services  Pager: [phone] Office: [phone]   Raymond [name] 08/01/2020, 10:59 AM

## 2020-08-01 NOTE — Progress Notes (Incomplete)
          Daily Rounding Note  08/01/2020, 3:11 PM  LOS: 4 days   SUBJECTIVE:   Chief complaint:     ***  OBJECTIVE:         Vital signs in last 24 hours:    Temp:  [97.3 F (36.3 C)-99.1 F (37.3 C)] 99.1 F (37.3 C) (01/22 1259) Pulse Rate:  [88-101] 88 (01/22 1259) Resp:  [14-22] 18 (01/22 1259) BP: (101-173)/(49-112) 122/100 (01/22 1259) SpO2:  [96 %-100 %] 98 % (01/22 1259) Weight:  [72.6 kg] 72.6 kg (01/21 1527) Last BM Date: 07/31/20 Filed Weights   07/28/20 1539 07/31/20 1527  Weight: 74.3 kg 72.6 kg   General: ***   Heart: *** Chest: *** Abdomen: ***  Extremities: *** Neuro/Psych:  ***  Intake/Output from previous day: 01/21 0701 - 01/22 0700 In: 200 [I.V.:200] Out: 0   Intake/Output this shift: Total I/O In: 240 [P.O.:240] Out: -   Lab Results: Recent Labs    07/30/20 0711 07/30/20 1614 07/31/20 0413  WBC 2.3* 2.0* 2.4*  HGB 8.7* 8.4* 10.1*  HCT 26.8* 26.3* 32.5*  PLT 120* 98* 107*   BMET Recent Labs    07/30/20 0711 07/31/20 0413  NA 142 142  K 4.9 4.5  CL 99 100  CO2 24 26  GLUCOSE 78 81  BUN 84* 31*  CREATININE 15.36* 9.18*  CALCIUM 9.7 8.7*   LFT Recent Labs    07/30/20 0711  ALBUMIN 2.9*   PT/INR No results for input(s): LABPROT, INR in the last 72 hours. Hepatitis Panel Recent Labs    07/30/20 0711  HEPBSAG NON REACTIVE    Studies/Results: No results found.  ASSESMENT:   ***    PLAN   ***    Azucena Freed  08/01/2020, 3:11 PM Phone [phone]

## 2020-08-02 DIAGNOSIS — D649 Anemia, unspecified: Secondary | ICD-10-CM | POA: Diagnosis not present

## 2020-08-02 LAB — BASIC METABOLIC PANEL
Anion gap: 16 — ABNORMAL HIGH (ref 5–15)
BUN: 55 mg/dL — ABNORMAL HIGH (ref 8–23)
CO2: 23 mmol/L (ref 22–32)
Calcium: 7.5 mg/dL — ABNORMAL LOW (ref 8.9–10.3)
Chloride: 102 mmol/L (ref 98–111)
Creatinine, Ser: 12.97 mg/dL — ABNORMAL HIGH (ref 0.61–1.24)
GFR, Estimated: 4 mL/min — ABNORMAL LOW (ref >60)
Glucose, Bld: 95 mg/dL (ref 70–99)
Potassium: 4.6 mmol/L (ref 3.5–5.1)
Sodium: 141 mmol/L (ref 135–145)

## 2020-08-02 LAB — CBC
HCT: 26.1 % — ABNORMAL LOW (ref 39.0–52.0)
Hemoglobin: 8.2 g/dL — ABNORMAL LOW (ref 13.0–17.0)
MCH: 32.7 pg (ref 26.0–34.0)
MCHC: 31.4 g/dL (ref 30.0–36.0)
MCV: 104 fL — ABNORMAL HIGH (ref 80.0–100.0)
Platelets: 119 10*3/uL — ABNORMAL LOW (ref 150–400)
RBC: 2.51 MIL/uL — ABNORMAL LOW (ref 4.22–5.81)
RDW: 20.9 % — ABNORMAL HIGH (ref 11.5–15.5)
WBC: 4.4 10*3/uL (ref 4.0–10.5)
nRBC: 0 % (ref 0.0–0.2)

## 2020-08-02 LAB — GLUCOSE, CAPILLARY
Glucose-Capillary: 98 mg/dL (ref 70–99)
Glucose-Capillary: 112 mg/dL — ABNORMAL HIGH (ref 70–99)
Glucose-Capillary: 104 mg/dL — ABNORMAL HIGH (ref 70–99)

## 2020-08-02 LAB — MAGNESIUM: Magnesium: 1.9 mg/dL (ref 1.7–2.4)

## 2020-08-02 MED ORDER — CHLORHEXIDINE GLUCONATE CLOTH 2 % EX PADS: 6.0000 | MEDICATED_PAD | Freq: Every day | CUTANEOUS | Status: DC

## 2020-08-02 MED ORDER — CALCITRIOL 0.5 MCG PO CAPS: 1.5000 ug | ORAL_CAPSULE | ORAL | 0 refills | Status: AC

## 2020-08-02 MED ORDER — CLOPIDOGREL BISULFATE 75 MG PO TABS
75.0000 mg | ORAL_TABLET | Freq: Every day | ORAL | Status: DC
  Administered 2020-08-02 – 2020-08-03 (×2): 75 mg via ORAL
  Filled 2020-08-02 (×2): qty 1

## 2020-08-02 MED ORDER — CINACALCET HCL 30 MG PO TABS: 180.0000 mg | ORAL_TABLET | ORAL | 0 refills | Status: AC

## 2020-08-02 MED ORDER — DARBEPOETIN ALFA 200 MCG/0.4ML IJ SOSY: 200.0000 ug | PREFILLED_SYRINGE | INTRAMUSCULAR | Status: DC

## 2020-08-02 NOTE — Evaluation (Signed)
Occupational Therapy Evaluation/Discharge Patient Details Name: Raymond [name] MRN: [id number] DOB: 1956/01/07 Today's Date: 08/02/2020    History of Present Illness Pt is a 65 y/o male admitted secondary to SOB. Found to have low hemoglobin, likely from GI bleed. Pt is s/p capsule study. Pt is also COVID +. PMH includes ESRD on HD, a fib, CAD, and PVD.   Clinical Impression   PTA, pt lives alone in senior apartment complex. Pt reports Independence in ADLs, IADLs and mobility without use of AD. Pt does not drive, requires assistance with transportation. Pt presents now with deficits in dynamic standing balance and endurance. Balance deficits improve with use of AD for mobility. Pt overall Modified Independent for ADLs and mobility in room when using RW. Provided energy conservation handout and education on strategies to implement during ADLs/IADLs at home. Pt engaged in conversation and verbalized understanding of all education. Pt would benefit from use of Rollator for energy conservation and improved stability. Pt would benefit from [facility] for use as shower chair when fatigued from dialysis. Pt on RA with mild SOB/fatigue reported, HR WFL. No further skilled OT services needed at this time. OT to sign off.     Follow Up Recommendations  No OT follow up    Equipment Recommendations  3 in 1 bedside commode;Other (comment) (Rollator)    Recommendations for Other Services       Precautions / Restrictions Precautions Precautions: Fall Restrictions Weight Bearing Restrictions: No      Mobility Bed Mobility Overal bed mobility: Modified Independent                  Transfers Overall transfer level: Modified independent Equipment used: Rolling walker (2 wheeled) Transfers: Sit to/from Stand           General transfer comment: Good hand placement for sit to stand transition, no cues needed. Able to mobilize in room with RW without physical assist though does endorse fatigue  after a few laps    Balance Overall balance assessment: Needs assistance Sitting-balance support: No upper extremity supported Sitting balance-Leahy Scale: Normal     Standing balance support: Single extremity supported;Bilateral upper extremity supported;During functional activity Standing balance-Leahy Scale: Fair Standing balance comment: fair static standing, improved balance with UE support during mobility                           ADL either performed or assessed with clinical judgement   ADL Overall ADL's : Modified independent                                       General ADL Comments: Pt overall Modified Independent with use of RW for mobility/stability. Able to demo various methods for LB ADLs. Educated on energy conservation strategies during ADLs/IADLs and use of DME to assist in these strategies especially when fatigued from dialysis. Pt engaged in session throughout, presents as low fall risk when using RW.     Vision Patient Visual Report: No change from baseline Vision Assessment?: No apparent visual deficits     Perception     Praxis      Pertinent Vitals/Pain Pain Assessment: Faces Faces Pain Scale: Hurts a little bit Pain Location: L LE around groin from recent stent per pt Pain Descriptors / Indicators: Sore Pain Intervention(s): Monitored during session     Hand Dominance Right  Extremity/Trunk Assessment Upper Extremity Assessment Upper Extremity Assessment: Overall WFL for tasks assessed (L UE fistula in forearm)   Lower Extremity Assessment Lower Extremity Assessment: Defer to PT evaluation   Cervical / Trunk Assessment Cervical / Trunk Assessment: Normal   Communication Communication Communication: No difficulties   Cognition Arousal/Alertness: Awake/alert Behavior During Therapy: WFL for tasks assessed/performed Overall Cognitive Status: Within Functional Limits for tasks assessed                                  General Comments: A&OX4, pleasant and cooperative   General Comments  HR 103 after activity. Pt on RA throughout endorses mild SOB and fatigue after activity.    Exercises     Shoulder Instructions      Home Living Family/patient expects to be discharged to:: Private residence Living Arrangements: Alone Available Help at Discharge: Family;Friend(s);Available PRN/intermittently Type of Home: Apartment Home Access: Elevator     Home Layout: One level     Bathroom Shower/Tub: Teacher, early years/pre: Standard Bathroom Accessibility: Yes How Accessible: Accessible via walker Home Equipment: Grab bars - toilet;Grab bars - tub/shower          Prior Functioning/Environment Level of Independence: Independent        Comments: Pt independent with ADLs, IADLs, and community ambulation. Does not work or drive and has someone drive him to store        OT Problem List:        OT Treatment/Interventions:      OT Goals(Current goals can be found in the care plan section) Acute Rehab OT Goals Patient Stated Goal: to go home, make sure he has a ride to dialysis OT Goal Formulation: All assessment and education complete, DC therapy  OT Frequency:     Barriers to D/C:            Co-evaluation              AM-PAC OT "6 Clicks" Daily Activity     Outcome Measure Help from another person eating meals?: None Help from another person taking care of personal grooming?: None Help from another person toileting, which includes using toliet, bedpan, or urinal?: None Help from another person bathing (including washing, rinsing, drying)?: None Help from another person to put on and taking off regular upper body clothing?: None Help from another person to put on and taking off regular lower body clothing?: None 6 Click Score: 24   End of Session Equipment Utilized During Treatment: Rolling walker  Activity Tolerance: Patient tolerated treatment  well Patient left: in bed;with call bell/phone within reach  OT Visit Diagnosis: Other abnormalities of gait and mobility (R26.89)                Time: 3557-3220 OT Time Calculation (min): 25 min Charges:  OT General Charges $OT Visit: 1 Visit OT Evaluation $OT Eval Low Complexity: 1 Low OT Treatments $Self Care/Home Management : 8-22 mins  Layla Maw, OTR/L  Layla Maw 08/02/2020, 8:55 AM

## 2020-08-02 NOTE — Discharge Summary (Addendum)
Physician Discharge Summary  Raymond [name] ACZ:[id number] DOB: 09-07-1955 DOA: 07/28/2020  PCP: [name], FNP  Admit date: 07/28/2020 Discharge date: 08/03/2020  Admitted From: Home Disposition:  Home  Recommendations for Outpatient Follow-up:  1. Follow up with PCP in 1-2 weeks 2. Please obtain CMP/CBC in one week your next doctors visit.  3. His Eliquis will be discontinued until further discussion by him and his cardiologist 4. Continue Plavix  Discharge Condition: Stable CODE STATUS: Full code Diet recommendation: Renal  Brief/Interim Summary: 65 year old with history of P A. fib on Eliquis, hep C, ESRD Monday Wednesday Friday, CAD, PVD s/p femoropopliteal bypass, GI bleed from AVM was sent from his dialysis due to tachycardia and drowsiness.  In the hospital he was found to have a hemoglobin of 6.1 therefore transfusion was ordered.  GI team was consulted with plans for endoscopic evaluation. EGD 1/21 showed normal esophagus, stomach and duodenum.. Video capsule was ordered which was very challenging. It did show nonbleeding AVM but study was limited. GI recommending holding off on Eliquis but continuing Plavix. Further discussion between his cardiologist and himself for long-term Eliquis. Otherwise stable for discharge.   Assessment & Plan:   Principal Problem:   Acute on chronic anemia Active Problems:   ESRD (end stage renal disease) (HCC)   Hypoglycemia   Acute upper GI bleed   Acute metabolic encephalopathy   COVID-19 virus infection   Acute blood loss anemia, source GI, a.m. labs pending Chronic pancytopenia-leukopenia/thrombocytopenia -Seen by GI status post EGD and video capsule - Resume Plavix but continue to hold Eliquis until his further discussion by cardiology outpatient - EGD 1/21-normal esophagus stomach and duodenum - Video capsule -limited study but saw a nonbleeding AVM.  Acute metabolic encephalopathy - Currently at baseline  Elevated  troponin - From demand ischemia.  Supportive care.  ESRD TTS - Nephrology team following. -Calcitriol, Sensipar, Aranesp weekly  History of paroxysmal atrial fibrillation Essential hypertension - Eliquis on hold.  On amiodarone. Continue Plavix -Norvasc 5 mg daily  COVID-19, asymptomatic - Chest x-ray is clear. He is vaccinated and boosted. Supportive care.  History of hepatitis C status post interferon treatment  PAD status post left femoropopliteal bypass -follows outpatient vascular.  On Plavix and statin at home  Body mass index is 24.33 kg/m.         Discharge Diagnoses:  Principal Problem:   Acute on chronic anemia Active Problems:   ESRD (end stage renal disease) ([facility])   Hypoglycemia   Acute upper GI bleed   Acute metabolic encephalopathy   COVID-19 virus infection      Consultations: Gastroenterology Subjective: Feels okay no complaints this morning.  Discharge Exam: Vitals:   08/03/20 0544 08/03/20 1043  BP: 129/83 (!) 139/105  Pulse: 94 86  Resp: 16 18  Temp: 98.8 F (37.1 C)   SpO2: 92% 100%   Vitals:   08/02/20 0829 08/02/20 1950 08/03/20 0544 08/03/20 1043  BP: (!) 153/102 (!) 156/97 129/83 (!) 139/105  Pulse: 78 89 94 86  Resp: 18 17 16 18   Temp: 98.4 F (36.9 C) 98.2 F (36.8 C) 98.8 F (37.1 C)   TempSrc:  Oral Oral   SpO2: 100% 98% 92% 100%  Weight:      Height:        General: Pt is alert, awake, not in acute distress Cardiovascular: RRR, S1/S2 +, no rubs, no gallops Respiratory: CTA bilaterally, no wheezing, no rhonchi Abdominal: Soft, NT, ND, bowel sounds + Extremities:  no edema, no cyanosis  Discharge Instructions   Allergies as of 08/03/2020   No Known Allergies     Medication List    STOP taking these medications   apixaban 5 MG Tabs tablet Commonly known as: ELIQUIS     TAKE these medications   albuterol 108 (90 Base) MCG/ACT inhaler Commonly known as: VENTOLIN HFA Inhale 2 puffs into the lungs  every 4 (four) hours as needed for shortness of breath.   amiodarone 200 MG tablet Commonly known as: PACERONE Take 1 tablet (200 mg total) by mouth daily. Take two tablets once a day for 7 days. Starting on 06/30/20 take only one tablet daily   amLODipine 2.5 MG tablet Commonly known as: NORVASC Take 2.5 mg by mouth at bedtime.   bismuth subsalicylate 299 MG chewable tablet Commonly known as: PEPTO BISMOL Chew 524 mg by mouth as needed for indigestion or diarrhea or loose stools.   calcitRIOL 0.5 MCG capsule Commonly known as: ROCALTROL Take 3 capsules (1.5 mcg total) by mouth Every Tuesday,Thursday,and Saturday with dialysis. Start taking on: August 04, 2020   cinacalcet 30 MG tablet Commonly known as: SENSIPAR Take 6 tablets (180 mg total) by mouth Every Tuesday,Thursday,and Saturday with dialysis. Start taking on: August 04, 2020   clopidogrel 75 MG tablet Commonly known as: PLAVIX Take 1 tablet (75 mg total) by mouth daily.   MIRCERA IJ Mircera   oxyCODONE 5 MG immediate release tablet Commonly known as: Oxy IR/ROXICODONE Take 5 mg by mouth 3 (three) times daily as needed.   oxyCODONE-acetaminophen 5-325 MG tablet Commonly known as: Percocet Take 1 tablet by mouth every 6 (six) hours as needed for severe pain.   pantoprazole 40 MG tablet Commonly known as: Protonix Take 1 tablet (40 mg total) by mouth daily.   QUEtiapine 100 MG tablet Commonly known as: SEROQUEL Take 100 mg by mouth at bedtime as needed (sleep).   rosuvastatin 10 MG tablet Commonly known as: CRESTOR Take 1 tablet (10 mg total) by mouth daily.   sevelamer carbonate 800 MG tablet Commonly known as: RENVELA Take 4 tablets (3,200 mg total) by mouth 3 (three) times daily with meals. What changed:   how much to take  when to take this  additional instructions            Durable Medical Equipment  (From admission, onward)         Start     Ordered   08/03/20 1052  For home use  only DME 4 wheeled rolling walker with seat  Once       Question:  Patient needs a walker to treat with the following condition  Answer:  Decreased functional mobility and endurance   08/03/20 1052          Follow-up Information    [name], FNP. Schedule an appointment as soon as possible for a visit in 1 week(s).   Specialty: Family Medicine Contact information: [place] [address] [phone]        Josue Hector, MD .   Specialty: Cardiology Contact information: [phone] [place] [address] [place], [facility]. Call.   Specialty: [place] Why: to arrange home health physical therapy visits Contact information: [address] [place] [address] [phone]        [facility]. Call.   Why: for questions about your walker Contact information: [phone]  [facility] Follow up on 08/04/2020.   Why: Transortation arranged to take you to outpatient dialysis on 1/25, 1/27, 1/29, and 2/1 with pick up time 4:30 pm. Be ready when transport arrives or transportation will have to leave and you will miss your appointment. Resume your regular transport 2/3. Contact information: [phone]             No Known Allergies  You were cared for by a hospitalist during your hospital stay. If you have any questions about your discharge medications or the care you received while you were in the hospital after you are discharged, you can call the unit and asked to speak with the hospitalist on call if the hospitalist that took care of you is not available. Once you are discharged, your primary care physician will handle any further medical issues. Please note that no refills for any discharge medications will be authorized once you are discharged, as it is imperative that you return to your primary care physician (or establish a relationship with a primary  care physician if you do not have one) for your aftercare needs so that they can reassess your need for medications and monitor your lab values.   Procedures/Studies: CT Head Wo Contrast  Result Date: 07/28/2020 CLINICAL DATA:  Delirium. EXAM: CT HEAD WITHOUT CONTRAST TECHNIQUE: Contiguous axial images were obtained from the base of the skull through the vertex without intravenous contrast. COMPARISON:  CT head May 28, 2019 FINDINGS: Brain: No evidence of acute large vascular territory infarction, hemorrhage, hydrocephalus, extra-axial collection or mass lesion/mass effect. Mild patchy white matter hypodensities, most likely related to chronic microvascular ischemic disease. Vascular: Calcific atherosclerosis. No hyperdense vessel identified. Skull: No acute fracture. Sinuses/Orbits: Visualized sinuses are clear. Remote left medial orbital wall fracture. Other: No mastoid effusions. IMPRESSION: No evidence of acute intracranial abnormality. Electronically Signed   By: Margaretha Sheffield MD   On: 07/28/2020 16:03   DG Chest Portable 1 View  Result Date: 07/28/2020 CLINICAL DATA:  Chest pain x1 day. EXAM: PORTABLE CHEST 1 VIEW COMPARISON:  June 20, 2020 FINDINGS: The cardiac silhouette is markedly enlarged. Moderate severity calcification of the aortic arch is seen. Both lungs are clear. The visualized skeletal structures are unremarkable. IMPRESSION: Cardiomegaly without evidence of acute or active cardiopulmonary disease. Electronically Signed   By: Virgina Norfolk M.D.   On: 07/28/2020 16:32     The results of significant diagnostics from this hospitalization (including imaging, microbiology, ancillary and laboratory) are listed below for reference.     Microbiology: Recent Results (from the past 240 hour(s))  Resp Panel by RT-PCR (Flu A&B, Covid) Nasopharyngeal Swab     Status: Abnormal   Collection Time: 07/28/20  3:46 PM   Specimen: Nasopharyngeal Swab; Nasopharyngeal(NP) swabs in  vial transport medium  Result Value Ref Range Status   SARS Coronavirus 2 by RT PCR POSITIVE (A) NEGATIVE Final    Comment: RESULT CALLED TO, READ BACK BY AND VERIFIED WITH: T SHROPSHIRE RN 1708 07/28/20 A BROWNING (NOTE) SARS-CoV-2 target nucleic acids are DETECTED.  The SARS-CoV-2 RNA is generally detectable in upper respiratory specimens during the acute phase of infection. Positive results are indicative of the presence of the identified virus, but do not rule out bacterial infection or co-infection with other pathogens not detected by the test. Clinical correlation with patient history and other diagnostic information is necessary to determine patient infection status. The expected result is Negative.  Fact Sheet for Patients: EntrepreneurPulse.com.au  Fact Sheet for  Healthcare Providers: IncredibleEmployment.be  This test is not yet approved or cleared by the [place] and  has been authorized for detection and/or diagnosis of SARS-CoV-2 by FDA under an Emergency Use Authorization (EUA).  This EUA will remain in effect (meaning this test  can be used) for the duration of  the COVID-19 declaration under Section 564(b)(1) of the Act, 21 U.S.C. section 360bbb-3(b)(1), unless the authorization is terminated or revoked sooner.     Influenza A by PCR NEGATIVE NEGATIVE Final   Influenza B by PCR NEGATIVE NEGATIVE Final    Comment: (NOTE) The Xpert Xpress SARS-CoV-2/FLU/RSV plus assay is intended as an aid in the diagnosis of influenza from Nasopharyngeal swab specimens and should not be used as a sole basis for treatment. Nasal washings and aspirates are unacceptable for Xpert Xpress SARS-CoV-2/FLU/RSV testing.  Fact Sheet for Patients: EntrepreneurPulse.com.au  Fact Sheet for Healthcare Providers: IncredibleEmployment.be  This test is not yet approved or cleared by the [place] FDA and has  been authorized for detection and/or diagnosis of SARS-CoV-2 by FDA under an Emergency Use Authorization (EUA). This EUA will remain in effect (meaning this test can be used) for the duration of the COVID-19 declaration under Section 564(b)(1) of the Act, 21 U.S.C. section 360bbb-3(b)(1), unless the authorization is terminated or revoked.  Performed at [facility], [place] [address], [place], [place] [address]      Labs: BNP (last 3 results) Recent Labs    01/14/20 0600  BNP 2,202.5*   Basic Metabolic Panel: Recent Labs  Lab 07/29/20 0506 07/30/20 0711 07/31/20 0413 08/01/20 1509 08/02/20 0417 08/03/20 0824  NA 141 142 142 140 141 138  K 5.1 4.9 4.5 4.4 4.6 4.5  CL 99 99 100 101 102 99  CO2 24 24 26 22 23  21*  GLUCOSE 77 78 81 112* 95 78  BUN 78* 84* 31* 47* 55* 63*  CREATININE 13.65* 15.36* 9.18* 12.41* 12.97* 14.88*  CALCIUM 9.7 9.7 8.7* 8.0* 7.5* 7.4*  MG 2.0  --  1.9 1.9 1.9 1.8  PHOS 7.7* 9.3*  --   --   --   --    Liver Function Tests: Recent Labs  Lab 07/28/20 1539 07/29/20 0506 07/30/20 0711  AST 8* 9*  --   ALT 9 9  --   ALKPHOS 54 50  --   BILITOT 0.4 0.9  --   PROT 6.9 6.2*  --   ALBUMIN 3.1* 2.9* 2.9*   No results for input(s): LIPASE, AMYLASE in the last 168 hours. Recent Labs  Lab 07/28/20 1539  AMMONIA 28   CBC: Recent Labs  Lab 07/28/20 1539 07/28/20 1624 07/29/20 0506 07/29/20 0900 07/30/20 1614 07/31/20 0413 08/01/20 1509 08/02/20 0417 08/03/20 0824  WBC 1.4*  --  2.2*   < > 2.0* 2.4* 5.3 4.4 3.6*  NEUTROABS 1.1*  --  1.3*  --   --   --   --   --   --   HGB 5.6*   < > 6.8*   < > 8.4* 10.1* 9.1* 8.2* 7.7*  HCT 17.4*   < > 21.9*   < > 26.3* 32.5* 27.8* 26.1* 23.7*  MCV 107.4*  --  105.8*   < > 100.8* 102.8* 102.2* 104.0* 101.7*  PLT 121*  --  117*   < > 98* 107* 123* 119* 122*   < > = values in this interval not displayed.   Cardiac Enzymes: No results for input(s): CKTOTAL, CKMB,  CKMBINDEX, TROPONINI in the last 168  hours. BNP: Invalid input(s): POCBNP CBG: Recent Labs  Lab 08/01/20 2209 08/02/20 0619 08/02/20 1228 08/02/20 2257 08/03/20 0542  GLUCAP 165* 98 112* 104* 112*   D-Dimer No results for input(s): DDIMER in the last 72 hours. Hgb A1c No results for input(s): HGBA1C in the last 72 hours. Lipid Profile No results for input(s): CHOL, HDL, LDLCALC, TRIG, CHOLHDL, LDLDIRECT in the last 72 hours. Thyroid function studies No results for input(s): TSH, T4TOTAL, T3FREE, THYROIDAB in the last 72 hours.  Invalid input(s): FREET3 Anemia work up No results for input(s): VITAMINB12, FOLATE, FERRITIN, TIBC, IRON, RETICCTPCT in the last 72 hours. Urinalysis    Component Value Date/Time   COLORURINE YELLOW 05/08/2010 1335   APPEARANCEUR CLEAR 05/08/2010 1335   LABSPEC 1.009 05/08/2010 1335   PHURINE 6.0 05/08/2010 1335   GLUCOSEU NEGATIVE 05/08/2010 1335   HGBUR NEGATIVE 05/08/2010 1335   BILIRUBINUR NEGATIVE 05/08/2010 1335   KETONESUR NEGATIVE 05/08/2010 1335   PROTEINUR 100 (A) 05/08/2010 1335   UROBILINOGEN 0.2 05/08/2010 1335   NITRITE NEGATIVE 05/08/2010 1335   LEUKOCYTESUR SMALL (A) 05/08/2010 1335   Sepsis Labs Invalid input(s): PROCALCITONIN,  WBC,  LACTICIDVEN Microbiology Recent Results (from the past 240 hour(s))  Resp Panel by RT-PCR (Flu A&B, Covid) Nasopharyngeal Swab     Status: Abnormal   Collection Time: 07/28/20  3:46 PM   Specimen: Nasopharyngeal Swab; Nasopharyngeal(NP) swabs in vial transport medium  Result Value Ref Range Status   SARS Coronavirus 2 by RT PCR POSITIVE (A) NEGATIVE Final    Comment: RESULT CALLED TO, READ BACK BY AND VERIFIED WITH: T SHROPSHIRE RN 1708 07/28/20 A BROWNING (NOTE) SARS-CoV-2 target nucleic acids are DETECTED.  The SARS-CoV-2 RNA is generally detectable in upper respiratory specimens during the acute phase of infection. Positive results are indicative of the presence of the identified virus, but do not rule out bacterial  infection or co-infection with other pathogens not detected by the test. Clinical correlation with patient history and other diagnostic information is necessary to determine patient infection status. The expected result is Negative.  Fact Sheet for Patients: EntrepreneurPulse.com.au  Fact Sheet for Healthcare Providers: IncredibleEmployment.be  This test is not yet approved or cleared by the [place] FDA and  has been authorized for detection and/or diagnosis of SARS-CoV-2 by FDA under an Emergency Use Authorization (EUA).  This EUA will remain in effect (meaning this test  can be used) for the duration of  the COVID-19 declaration under Section 564(b)(1) of the Act, 21 U.S.C. section 360bbb-3(b)(1), unless the authorization is terminated or revoked sooner.     Influenza A by PCR NEGATIVE NEGATIVE Final   Influenza B by PCR NEGATIVE NEGATIVE Final    Comment: (NOTE) The Xpert Xpress SARS-CoV-2/FLU/RSV plus assay is intended as an aid in the diagnosis of influenza from Nasopharyngeal swab specimens and should not be used as a sole basis for treatment. Nasal washings and aspirates are unacceptable for Xpert Xpress SARS-CoV-2/FLU/RSV testing.  Fact Sheet for Patients: EntrepreneurPulse.com.au  Fact Sheet for Healthcare Providers: IncredibleEmployment.be  This test is not yet approved or cleared by the [place] FDA and has been authorized for detection and/or diagnosis of SARS-CoV-2 by FDA under an Emergency Use Authorization (EUA). This EUA will remain in effect (meaning this test can be used) for the duration of the COVID-19 declaration under Section 564(b)(1) of the Act, 21 U.S.C. section 360bbb-3(b)(1), unless the authorization is terminated or revoked.  Performed at [facility], [place] [address] [place], [place] [address]      Time coordinating discharge:  I have spent 35 minutes  face to face with the patient and on the ward discussing the patients care, assessment, plan and disposition with other care givers. >50% of the time was devoted counseling the patient about the risks and benefits of treatment/Discharge disposition and coordinating care.   SIGNED:   Damita Lack, MD  [facility] 08/03/2020, 11:57 AM   If 7PM-7AM, please contact night-coverage

## 2020-08-02 NOTE — Progress Notes (Signed)
[facility] Progress Note  Subjective:   last HD on 1/20 with 2 kg UF.  He has been comfortable on room air.  Feels ok this morning.  Asks if he can ride the bus with covid and I reinforced that this was not an option  Review of systems:   Denies n/v Denies shortness of breath or chest pain  Denies blood per rectum  Vitals:   08/01/20 1259 08/01/20 2209 08/02/20 0550 08/02/20 0829  BP: (!) 122/100 134/81 130/77 (!) 153/102  Pulse: 88 96 91 78  Resp: 18 18 18 18   Temp: 99.1 F (37.3 C) 98.4 F (36.9 C) 98.2 F (36.8 C) 98.4 F (36.9 C)  TempSrc: Oral     SpO2: 98% 98% 99% 100%  Weight:      Height:        Exam:  Adult male in bed in NAD   NCAT sclera anicteric  Chest unlabored on room air  S1S2 no rub  Abd soft ntnd    Ext traceLE edema appreciated   Alert, NF, ox3 and conversant  Psych normal mood and affect Access   LUA AVF+bruit    Home meds:  - amiodarone/ norvasc / eliquis/ plavix/ crestor  - seroquel/ percocet prn  - protonix / renvlea 4 tabs ac tid  - prn's/ vitamins/ supplements  CXR - IMPRESSION: Cardiomegaly without evidence of acute or active cardiopulmonary disease.    OP HD: TTS at [facility]  4h  450 /800  69.5kg  2/2 bath P4  AVF  Hep none  - sensipar 180 tiw  - mircera 200 ug q2, last 12/30  - calcitriol 1.5ug tiw po  - had 3h HD yest after missing 1 week   Assessment/ Plan:  1. GI bleed with acute blood loss anemia. GI consulted. plavix and eliquis on hold per primary 2. COVID infection - per primary team  3. ESRD - on HD TTS schedule outpatient.  Next HD here on 1/24 off schedule on covid shift  4. AMS - uremia from missed HD, covid. Back to baseline per charting 5. Atrial fib - on amio per primary team  6. HTN - acceptable control  7. Anemia ckd - aranesp 200 mcg ordered every wed   8. MBD ckd - cont vdra , sensipar and added back renvela   Disposition per primary team.  Per HD SW note, patient with  transportation barrier while covid positive.  Will need to be on covid shift at [facility] on discharge   Recent Labs  Lab 07/29/20 0506 07/29/20 0900 07/30/20 0711 07/30/20 1614 08/01/20 1509 08/02/20 0417  K 5.1  --  4.9   < > 4.4 4.6  BUN 78*  --  84*   < > 47* 55*  CREATININE 13.65*  --  15.36*   < > 12.41* 12.97*  CALCIUM 9.7  --  9.7   < > 8.0* 7.5*  PHOS 7.7*  --  9.3*  --   --   --   HGB 6.8*   < > 8.7*   < > 9.1* 8.2*   < > = values in this interval not displayed.   Inpatient medications: . amiodarone  200 mg Oral Daily  . amLODipine  5 mg Oral Daily  . calcitRIOL  1.5 mcg Oral Q T,Th,Sa-HD  . Chlorhexidine Gluconate Cloth  6 each Topical Q0600  . cinacalcet  180 mg Oral Q T,Th,Sa-HD  . clopidogrel  75 mg Oral Daily  . [START  ON 08/05/2020] darbepoetin (ARANESP) injection - DIALYSIS  200 mcg Intravenous Q Wed-HD  . pantoprazole  40 mg Oral Q0600  . rosuvastatin  10 mg Oral Daily  . sevelamer carbonate  3,200 mg Oral TID WC    acetaminophen, albuterol, dextromethorphan-guaiFENesin, dextrose, QUEtiapine, senna-docusate   Claudia Desanctis, MD 08/02/2020  10:16 AM

## 2020-08-03 ENCOUNTER — Encounter (HOSPITAL_COMMUNITY): Payer: Self-pay | Admitting: Gastroenterology

## 2020-08-03 DIAGNOSIS — D649 Anemia, unspecified: Secondary | ICD-10-CM | POA: Diagnosis not present

## 2020-08-03 LAB — BASIC METABOLIC PANEL
Anion gap: 18 — ABNORMAL HIGH (ref 5–15)
BUN: 63 mg/dL — ABNORMAL HIGH (ref 8–23)
CO2: 21 mmol/L — ABNORMAL LOW (ref 22–32)
Calcium: 7.4 mg/dL — ABNORMAL LOW (ref 8.9–10.3)
Chloride: 99 mmol/L (ref 98–111)
Creatinine, Ser: 14.88 mg/dL — ABNORMAL HIGH (ref 0.61–1.24)
GFR, Estimated: 3 mL/min — ABNORMAL LOW (ref >60)
Glucose, Bld: 78 mg/dL (ref 70–99)
Potassium: 4.5 mmol/L (ref 3.5–5.1)
Sodium: 138 mmol/L (ref 135–145)

## 2020-08-03 LAB — GLUCOSE, CAPILLARY: Glucose-Capillary: 112 mg/dL — ABNORMAL HIGH (ref 70–99)

## 2020-08-03 LAB — MAGNESIUM: Magnesium: 1.8 mg/dL (ref 1.7–2.4)

## 2020-08-03 LAB — CBC
HCT: 23.7 % — ABNORMAL LOW (ref 39.0–52.0)
Hemoglobin: 7.7 g/dL — ABNORMAL LOW (ref 13.0–17.0)
MCH: 33 pg (ref 26.0–34.0)
MCHC: 32.5 g/dL (ref 30.0–36.0)
MCV: 101.7 fL — ABNORMAL HIGH (ref 80.0–100.0)
Platelets: 122 10*3/uL — ABNORMAL LOW (ref 150–400)
RBC: 2.33 MIL/uL — ABNORMAL LOW (ref 4.22–5.81)
RDW: 20.4 % — ABNORMAL HIGH (ref 11.5–15.5)
WBC: 3.6 10*3/uL — ABNORMAL LOW (ref 4.0–10.5)
nRBC: 0 % (ref 0.0–0.2)

## 2020-08-03 LAB — PREPARE RBC (CROSSMATCH)

## 2020-08-03 MED ORDER — SODIUM CHLORIDE 0.9% IV SOLUTION: Freq: Once | INTRAVENOUS | Status: DC

## 2020-08-03 NOTE — Plan of Care (Signed)
  Problem: Education: Goal: Knowledge of General Education information will improve Description: Including pain rating scale, medication(s)/side effects and non-pharmacologic comfort measures Outcome: Adequate for Discharge   Problem: Health Behavior/Discharge Planning: Goal: Ability to manage health-related needs will improve Outcome: Adequate for Discharge   Problem: Clinical Measurements: Goal: Ability to maintain clinical measurements within normal limits will improve Outcome: Adequate for Discharge Goal: Will remain free from infection Outcome: Adequate for Discharge Goal: Diagnostic test results will improve Outcome: Adequate for Discharge Goal: Respiratory complications will improve Outcome: Adequate for Discharge Goal: Cardiovascular complication will be avoided Outcome: Adequate for Discharge   Problem: Activity: Goal: Risk for activity intolerance will decrease Outcome: Adequate for Discharge   Problem: Nutrition: Goal: Adequate nutrition will be maintained Outcome: Adequate for Discharge   Problem: Coping: Goal: Level of anxiety will decrease Outcome: Adequate for Discharge   Problem: Elimination: Goal: Will not experience complications related to bowel motility Outcome: Adequate for Discharge Goal: Will not experience complications related to urinary retention Outcome: Adequate for Discharge   Problem: Pain Managment: Goal: General experience of comfort will improve Outcome: Adequate for Discharge   Problem: Safety: Goal: Ability to remain free from injury will improve Outcome: Adequate for Discharge   Problem: Skin Integrity: Goal: Risk for impaired skin integrity will decrease Outcome: Adequate for Discharge   Problem: Acute Rehab PT Goals(only PT should resolve) Goal: Patient Will Transfer Sit To/From Stand Outcome: Adequate for Discharge Goal: Pt Will Ambulate Outcome: Adequate for Discharge   Problem: Education: Goal: Knowledge of disease and  its progression will improve 08/03/2020 1126 by Dolores Hoose, RN Outcome: Adequate for Discharge 08/03/2020 7741 by Dolores Hoose, RN Outcome: Progressing Goal: Individualized Educational Video(s) Outcome: Adequate for Discharge   Problem: Fluid Volume: Goal: Compliance with measures to maintain balanced fluid volume will improve 08/03/2020 1126 by Dolores Hoose, RN Outcome: Adequate for Discharge 08/03/2020 2878 by Dolores Hoose, RN Outcome: Progressing   Problem: Health Behavior/Discharge Planning: Goal: Ability to manage health-related needs will improve Outcome: Adequate for Discharge   Problem: Nutritional: Goal: Ability to make healthy dietary choices will improve Outcome: Adequate for Discharge   Problem: Clinical Measurements: Goal: Complications related to the disease process, condition or treatment will be avoided or minimized Outcome: Adequate for Discharge

## 2020-08-03 NOTE — Progress Notes (Addendum)
DISCHARGE NOTE HOME Raymond [name] to be discharged Home per MD order. Discussed prescriptions and follow up appointments with the patient. Prescriptions given to patient; medication list explained in detail. Patient verbalized understanding.  Skin clean, dry and intact without evidence of skin break down, no evidence of skin tears noted. IV catheter discontinued intact. Site without signs and symptoms of complications. Dressing and pressure applied. Pt denies pain at the site currently. No complaints noted.  Patient free of lines, drains, and wounds.   An After Visit Summary (AVS) was printed and given to the patient. Patient escorted via wheelchair, and discharged home via [place].  Dolores Hoose, RN

## 2020-08-03 NOTE — Progress Notes (Signed)
Renal Navigator greatly appreciates CM/W. [place]'s assistance in securing COVID transportation through [employer]. Patient requires HD in the hospital today prior to dc per Dr. Jonnie Finner. His outpatient COVID shift is TTS, so he is unable to get outpatient HD today and has not had HD since 1/20. Patient can d/c after short HD today and return to his clinic tomorrow.   Alphonzo Cruise, [place] Renal Navigator [phone]

## 2020-08-03 NOTE — Progress Notes (Signed)
Physical Therapy Treatment Patient Details Name: Raymond [name] MRN: [id number] DOB: 25-Aug-1955 Today's Date: 08/03/2020    History of Present Illness Pt is a 65 y/o male admitted secondary to SOB. Found to have low hemoglobin, likely from GI bleed. Pt is s/p capsule study. Pt is also COVID +. PMH includes ESRD on HD, a fib, CAD, and PVD.    PT Comments    Patient received in bed, pleasant and cooperative with therapy. Able to mobilize on a min guard level with RW, and progressed gait distance significantly, although still somewhat limited by fatigue and mild SOB with activity, recovered well with seated rest. Discussed and practiced use of IS, gave instructions for use throughout the day. Left in recliner with all needs met this morning, progressing well.     Follow Up Recommendations  Home health PT     Equipment Recommendations  Other (comment) (rollator)    Recommendations for Other Services       Precautions / Restrictions Precautions Precautions: Fall Restrictions Weight Bearing Restrictions: No    Mobility  Bed Mobility Overal bed mobility: Modified Independent                Transfers Overall transfer level: Modified independent Equipment used: Rolling walker (2 wheeled) Transfers: Sit to/from Stand Sit to Stand: Min guard         General transfer comment: did need occasional cue for hand placement during transitions, otherwise no physical assist given  Ambulation/Gait Ambulation/Gait assistance: Min guard Gait Distance (Feet): 70 Feet Assistive device: Rolling walker (2 wheeled) Gait Pattern/deviations: Step-through pattern;Decreased stride length Gait velocity: Decreased   General Gait Details: performed multiple laps in room with RW, steadiness and strength better today, no signs of desat but did become mildly SOB/with increased WOB durnig last lap but this rapidly improved with seated rest   Stairs             Wheelchair Mobility     Modified Rankin (Stroke Patients Only)       Balance Overall balance assessment: Needs assistance Sitting-balance support: No upper extremity supported Sitting balance-Leahy Scale: Normal     Standing balance support: Single extremity supported;Bilateral upper extremity supported;During functional activity Standing balance-Leahy Scale: Fair Standing balance comment: fair static standing, improved balance with UE support during mobility                            Cognition Arousal/Alertness: Awake/alert Behavior During Therapy: WFL for tasks assessed/performed Overall Cognitive Status: Within Functional Limits for tasks assessed                                 General Comments: A&OX4, pleasant and cooperative      Exercises      General Comments General comments (skin integrity, edema, etc.): HR 110BPM with activity, mild SOB and fatigue but able to progress well today      Pertinent Vitals/Pain Pain Assessment: No/denies pain Faces Pain Scale: No hurt Pain Intervention(s): Limited activity within patient's tolerance;Monitored during session    Home Living                      Prior Function            PT Goals (current goals can now be found in the care plan section) Acute Rehab PT Goals Patient Stated Goal: to go home,  make sure he has a ride to dialysis PT Goal Formulation: With patient Time For Goal Achievement: 08/14/20 Potential to Achieve Goals: Good Progress towards PT goals: Progressing toward goals    Frequency    Min 3X/week      PT Plan Current plan remains appropriate    Co-evaluation              AM-PAC PT "6 Clicks" Mobility   Outcome Measure  Help needed turning from your back to your side while in a flat bed without using bedrails?: None Help needed moving from lying on your back to sitting on the side of a flat bed without using bedrails?: None Help needed moving to and from a bed to a  chair (including a wheelchair)?: A Little Help needed standing up from a chair using your arms (e.g., wheelchair or bedside chair)?: A Little Help needed to walk in hospital room?: A Little Help needed climbing 3-5 steps with a railing? : A Little 6 Click Score: 20    End of Session   Activity Tolerance: Patient tolerated treatment well Patient left: in chair;with call bell/phone within reach Nurse Communication: Mobility status PT Visit Diagnosis: Muscle weakness (generalized) (M62.81);Unsteadiness on feet (R26.81)     Time: 8457-3344 PT Time Calculation (min) (ACUTE ONLY): 16 min  Charges:  $Gait Training: 8-22 mins                     Windell Norfolk, DPT, PN1   Supplemental Physical Therapist [place]    Pager [phone] Acute Rehab Office [phone]

## 2020-08-03 NOTE — TOC Transition Note (Signed)
Transition of Care [facility]) - CM/SW Discharge Note   Patient Details  Name: Raymond [name] MRN: [id number] Date of Birth: [birth year]  [facility]) CM/SW Contact:  Bartholomew Crews, RN Phone Number: [phone] 08/03/2020, 11:23 AM   Clinical Narrative:     Patient medically ready to transition home. Spoke with patient on room phone. Cell phone number is correct, however, his cell phone is in his jacket that was left at the dialysis center.   Discussed PT recommendations for DME and HH -patient is agreeable. Referral for [facility] PT accepted by [facility]. Patient will need HH PT order with Face to Face. Referral to [facility] for rollator to be delivered to the room.   Discussed Covid transportation needed to get to [facility] on 1/25, 1/27, 1/29, and 2/1 at 5pm and pick up at 8:30pm. [place] to provide this transportation through [employer].   Patient will also need [place] transportation to discharge home. Nurse to advise NCM when patient is ready.   Final next level of care: [place] Barriers to Discharge: No Barriers Identified   Patient Goals and CMS Choice Patient states their goals for this hospitalization and ongoing recovery are:: return home CMS Medicare.gov Compare Post Acute Care list provided to:: Patient Choice offered to / list presented to : Patient  Discharge Placement                       Discharge Plan and Services                DME Arranged: Walker rolling with seat DME Agency: [facility] Date DME Agency Contacted: 08/03/20 Time DME Agency Contacted: [id number] Representative spoke with at DME Agency: Freda Munro HH Arranged: PT [place]: [facility] Date [place]: 08/03/20 Time [place]: [id number] Representative spoke with at [place]: [place] ([place]) Interventions     Readmission Risk Interventions Readmission Risk Prevention Plan 06/22/2020  Transportation  Screening Complete  Medication Review Press photographer) Complete  PCP or Specialist appointment within 3-5 days of discharge Complete  HRI or [place] Complete  SW Recovery Care/Counseling Consult Complete  [place] Patient Refused  Some recent data might be hidden

## 2020-08-03 NOTE — Progress Notes (Signed)
Seen and examined at bedside, no complaints.  Awaiting transportation arrangements to his HD once he gets discharged.  TOC team is working on this.  Vital signs remained stable.  No evidence of any bleeding noted. Hemoglobin is slightly drifted downTo 7.7. For now we will order 1 unit of PRBC transfusion with his dialysis.  Plans to discharge him after his HD session today.  Discussed with nephrology, TOC and patient's RN.  Discharge summary updated from yesterday  Gerlean Ren MD [facility]

## 2020-08-03 NOTE — TOC Transition Note (Signed)
Transition of Care [facility]) - CM/SW Discharge Note   Patient Details  Name: Raymond [name] MRN: [id number] Date of Birth: Jan 28, 1956  Transition of Care [facility]) CM/SW Contact:  Bartholomew Crews, RN Phone Number: [phone] 08/03/2020, 5:35 PM   Clinical Narrative:     Patient back from hemodialysis. Ready to transition home. [facility] notified for covid transportation. [place] waiver faxed to [employer]. No further TOC needs identified.   Final next level of care: [place] Barriers to Discharge: No Barriers Identified   Patient Goals and CMS Choice Patient states their goals for this hospitalization and ongoing recovery are:: return home CMS Medicare.gov Compare Post Acute Care list provided to:: Patient Choice offered to / list presented to : Patient  Discharge Placement                       Discharge Plan and Services                DME Arranged: Walker rolling with seat DME Agency: [facility] Date DME Agency Contacted: 08/03/20 Time DME Agency Contacted: [id number] Representative spoke with at DME Agency: Freda Munro HH Arranged: PT [place]: [facility] Date [place]: 08/03/20 Time [place]: 1941 Representative spoke with at [place]: [place] ([place]) Interventions     Readmission Risk Interventions Readmission Risk Prevention Plan 06/22/2020  Transportation Screening Complete  Medication Review Press photographer) Complete  PCP or Specialist appointment within 3-5 days of discharge Complete  HRI or [place] Complete  SW Recovery Care/Counseling Consult Complete  [place] Patient Refused  Some recent data might be hidden

## 2020-08-03 NOTE — Plan of Care (Signed)
  Problem: Education: Goal: Knowledge of disease and its progression will improve Outcome: Progressing   Problem: Fluid Volume: Goal: Compliance with measures to maintain balanced fluid volume will improve Outcome: Progressing   

## 2020-08-03 NOTE — Progress Notes (Signed)
[facility] Progress Note  Subjective: seen in room, no c/o today.    Vitals:   08/02/20 0829 08/02/20 1950 08/03/20 0544 08/03/20 1043  BP: (!) 153/102 (!) 156/97 129/83 (!) 139/105  Pulse: 78 89 94 86  Resp: 18 17 16 18   Temp: 98.4 F (36.9 C) 98.2 F (36.8 C) 98.8 F (37.1 C)   TempSrc:  Oral Oral   SpO2: 100% 98% 92% 100%  Weight:      Height:        Exam:  Adult male in bed in NAD   NCAT sclera anicteric  Chest unlabored on room air  S1S2 no rub  Abd soft ntnd    Ext traceLE edema appreciated   Alert, NF, ox3 and conversant  Psych normal mood and affect Access   LUA AVF+bruit    Home meds:  - amiodarone/ norvasc / eliquis/ plavix/ crestor  - seroquel/ percocet prn  - protonix / renvlea 4 tabs ac tid  - prn's/ vitamins/ supplements  CXR - IMPRESSION: Cardiomegaly without evidence of acute or active cardiopulmonary disease.    OP HD: TTS at [facility]  4h  450 /800  69.5kg  2/2 bath P4  AVF  Hep none  - sensipar 180 tiw  - mircera 200 ug q2, last 12/30  - calcitriol 1.5ug tiw po  - had 3h HD yest after missing 1 week   Assessment/ Plan:  1. GI bleed with acute blood loss anemia. GI consulted. plavix and eliquis on hold per primary 2. COVID infection - per primary team  3. ESRD - on HD TTS schedule outpatient. Last HD here was 1/20, 4 days ago. Needs HD today prior to dc home, have d/w pmd and SW.  4. AMS - uremia from missed HD, covid. Back to baseline per charting 5. Atrial fib - on amio per primary team  6. HTN - acceptable control  7. Anemia ckd - aranesp 200 mcg ordered every wed   8. MBD ckd - cont vdra , sensipar and added back renvela  9. Dispo- OK for dc after HD today from our standpoint. DC per primary team. Will need to be on covid shift at [facility] on discharge.   [name], MD 08/03/2020  3:00 PM   Recent Labs  Lab 07/29/20 0506 07/29/20 0900 07/30/20 0711 07/30/20 1614 08/02/20 0417 08/03/20 0824  K  5.1  --  4.9   < > 4.6 4.5  BUN 78*  --  84*   < > 55* 63*  CREATININE 13.65*  --  15.36*   < > 12.97* 14.88*  CALCIUM 9.7  --  9.7   < > 7.5* 7.4*  PHOS 7.7*  --  9.3*  --   --   --   HGB 6.8*   < > 8.7*   < > 8.2* 7.7*   < > = values in this interval not displayed.   Inpatient medications: . sodium chloride   Intravenous Once  . amiodarone  200 mg Oral Daily  . amLODipine  5 mg Oral Daily  . calcitRIOL  1.5 mcg Oral Q T,Th,Sa-HD  . Chlorhexidine Gluconate Cloth  6 each Topical Q0600  . Chlorhexidine Gluconate Cloth  6 each Topical Q0600  . cinacalcet  180 mg Oral Q T,Th,Sa-HD  . clopidogrel  75 mg Oral Daily  . [START ON 08/05/2020] darbepoetin (ARANESP) injection - DIALYSIS  200 mcg Intravenous Q Wed-HD  . pantoprazole  40 mg Oral Q0600  .  rosuvastatin  10 mg Oral Daily  . sevelamer carbonate  3,200 mg Oral TID WC    acetaminophen, albuterol, dextromethorphan-guaiFENesin, dextrose, QUEtiapine, senna-docusate

## 2020-08-03 NOTE — Care Management Important Message (Signed)
Important Message  Patient Details  Name: Raymond [name] MRN: [id number] Date of Birth: 1956/02/06   Medicare Important Message Given:  Yes - Important Message mailed due to current National Emergency   Verbal consent obtained due to current National Emergency  Relationship to patient: Self Contact Name: Kayla Weekes Call Date: 08/03/20  Time: 1455 Phone: [phone] Outcome: No Answer/Busy Important Message mailed to: Patient address on file    Delorse Lek 08/03/2020, 2:55 PM

## 2020-08-04 ENCOUNTER — Telehealth: Payer: Self-pay | Admitting: Nephrology

## 2020-08-04 LAB — BPAM RBC
Blood Product Expiration Date: 202202122359
ISSUE DATE / TIME: 202201241525
Unit Type and Rh: 7300

## 2020-08-04 LAB — TYPE AND SCREEN
ABO/RH(D): B POS
Antibody Screen: NEGATIVE
Unit division: 0

## 2020-08-04 NOTE — Telephone Encounter (Signed)
Transition of Care Contact from [place]  Date of Discharge: 08/03/20 Date of Contact: 08/04/20 Method of contact: phone - attempted  Attempted to contact patient to discuss transition of care from inpatient admission.  Patient did not answer the phone.  Unable to leave message due to mailbox being full.  Will attempt to call them again and if unable to reach will follow up at dialysis.  Jen Mow, PA-C [facility] Pager: [phone]

## 2020-08-05 ENCOUNTER — Telehealth: Payer: Self-pay | Admitting: Nephrology

## 2020-08-05 DIAGNOSIS — D5529 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: Secondary | ICD-10-CM | POA: Insufficient documentation

## 2020-08-05 LAB — GLUCOSE, CAPILLARY: Glucose-Capillary: 291 mg/dL — ABNORMAL HIGH (ref 70–99)

## 2020-08-05 NOTE — Telephone Encounter (Signed)
Transition of care contact from inpatient facility  Date of Discharge: 08/03/20 Date of Contact: 08/05/20 -attempted  Method of contact: Phone  Attempted to contact patient to discuss transition of care from inpatient admission. Patient did not answer the phone. Unable to leave VM. Mailbox full.

## 2020-08-06 ENCOUNTER — Encounter (HOSPITAL_COMMUNITY): Payer: Self-pay | Admitting: Gastroenterology

## 2020-08-06 NOTE — Anesthesia Postprocedure Evaluation (Signed)
Anesthesia Post Note  Patient: Raymond [name]  Procedure(s) Performed: ENTEROSCOPY (N/A ) GIVENS CAPSULE STUDY (N/A )     Patient location during evaluation: PACU Anesthesia Type: MAC Level of consciousness: awake and alert and oriented Pain management: pain level controlled Vital Signs Assessment: post-procedure vital signs reviewed and stable Respiratory status: spontaneous breathing, nonlabored ventilation and respiratory function stable Cardiovascular status: blood pressure returned to baseline Postop Assessment: no apparent nausea or vomiting Anesthetic complications: no   No complications documented.        Brennan Bailey

## 2020-08-14 ENCOUNTER — Telehealth: Payer: Self-pay

## 2020-09-10 ENCOUNTER — Other Ambulatory Visit (HOSPITAL_COMMUNITY): Payer: Self-pay | Admitting: *Deleted

## 2020-09-11 ENCOUNTER — Ambulatory Visit (HOSPITAL_COMMUNITY)
Admission: RE | Admit: 2020-09-11 | Discharge: 2020-09-11 | Disposition: A | Discharge status: Home / Self Care | Payer: 59 | Source: Ambulatory Visit | Attending: Nephrology | Admitting: Nephrology

## 2020-09-11 ENCOUNTER — Other Ambulatory Visit: Payer: Self-pay

## 2020-09-11 DIAGNOSIS — N186 End stage renal disease: Secondary | ICD-10-CM | POA: Diagnosis present

## 2020-09-11 MED ORDER — SODIUM CHLORIDE 0.9% IV SOLUTION: Freq: Once | INTRAVENOUS | Status: DC

## 2020-09-12 LAB — TYPE AND SCREEN
ABO/RH(D): B POS
Antibody Screen: NEGATIVE
Unit division: 0

## 2020-09-12 LAB — BPAM RBC
Blood Product Expiration Date: 202203282359
ISSUE DATE / TIME: 202203040923
Unit Type and Rh: 7300

## 2020-12-09 ENCOUNTER — Inpatient Hospital Stay (HOSPITAL_COMMUNITY)
Admission: EM | Admit: 2020-12-09 | Discharge: 2020-12-11 | DRG: 682 | Disposition: A | Discharge status: Home / Self Care | Payer: 59 | Attending: Student | Admitting: Student

## 2020-12-09 ENCOUNTER — Emergency Department (HOSPITAL_COMMUNITY): Payer: 59

## 2020-12-09 ENCOUNTER — Other Ambulatory Visit: Payer: Self-pay

## 2020-12-09 ENCOUNTER — Ambulatory Visit (INDEPENDENT_AMBULATORY_CARE_PROVIDER_SITE_OTHER): Payer: 59

## 2020-12-09 ENCOUNTER — Ambulatory Visit (HOSPITAL_COMMUNITY)
Admission: EM | Admit: 2020-12-09 | Discharge: 2020-12-09 | Disposition: A | Discharge status: Nursing Home (Includes ICF) | Payer: 59 | Attending: Family Medicine | Admitting: Family Medicine

## 2020-12-09 ENCOUNTER — Encounter (HOSPITAL_COMMUNITY): Payer: Self-pay

## 2020-12-09 DIAGNOSIS — Z9115 Patient's noncompliance with renal dialysis: Secondary | ICD-10-CM

## 2020-12-09 DIAGNOSIS — N19 Unspecified kidney failure: Secondary | ICD-10-CM | POA: Diagnosis not present

## 2020-12-09 DIAGNOSIS — B1920 Unspecified viral hepatitis C without hepatic coma: Secondary | ICD-10-CM | POA: Diagnosis present

## 2020-12-09 DIAGNOSIS — E162 Hypoglycemia, unspecified: Secondary | ICD-10-CM | POA: Diagnosis present

## 2020-12-09 DIAGNOSIS — E7800 Pure hypercholesterolemia, unspecified: Secondary | ICD-10-CM | POA: Diagnosis present

## 2020-12-09 DIAGNOSIS — J449 Chronic obstructive pulmonary disease, unspecified: Secondary | ICD-10-CM | POA: Diagnosis present

## 2020-12-09 DIAGNOSIS — E872 Acidosis: Secondary | ICD-10-CM | POA: Diagnosis present

## 2020-12-09 DIAGNOSIS — N186 End stage renal disease: Secondary | ICD-10-CM | POA: Diagnosis present

## 2020-12-09 DIAGNOSIS — E875 Hyperkalemia: Secondary | ICD-10-CM | POA: Diagnosis present

## 2020-12-09 DIAGNOSIS — T383X5A Adverse effect of insulin and oral hypoglycemic [antidiabetic] drugs, initial encounter: Secondary | ICD-10-CM | POA: Diagnosis not present

## 2020-12-09 DIAGNOSIS — N2581 Secondary hyperparathyroidism of renal origin: Secondary | ICD-10-CM | POA: Diagnosis present

## 2020-12-09 DIAGNOSIS — I4892 Unspecified atrial flutter: Secondary | ICD-10-CM | POA: Diagnosis present

## 2020-12-09 DIAGNOSIS — F17200 Nicotine dependence, unspecified, uncomplicated: Secondary | ICD-10-CM | POA: Diagnosis not present

## 2020-12-09 DIAGNOSIS — I517 Cardiomegaly: Secondary | ICD-10-CM | POA: Diagnosis present

## 2020-12-09 DIAGNOSIS — I248 Other forms of acute ischemic heart disease: Secondary | ICD-10-CM | POA: Diagnosis present

## 2020-12-09 DIAGNOSIS — F17210 Nicotine dependence, cigarettes, uncomplicated: Secondary | ICD-10-CM | POA: Diagnosis present

## 2020-12-09 DIAGNOSIS — Z8249 Family history of ischemic heart disease and other diseases of the circulatory system: Secondary | ICD-10-CM

## 2020-12-09 DIAGNOSIS — I120 Hypertensive chronic kidney disease with stage 5 chronic kidney disease or end stage renal disease: Secondary | ICD-10-CM | POA: Diagnosis present

## 2020-12-09 DIAGNOSIS — R0902 Hypoxemia: Secondary | ICD-10-CM | POA: Diagnosis present

## 2020-12-09 DIAGNOSIS — Z833 Family history of diabetes mellitus: Secondary | ICD-10-CM

## 2020-12-09 DIAGNOSIS — F1010 Alcohol abuse, uncomplicated: Secondary | ICD-10-CM | POA: Diagnosis present

## 2020-12-09 DIAGNOSIS — R739 Hyperglycemia, unspecified: Secondary | ICD-10-CM | POA: Diagnosis present

## 2020-12-09 DIAGNOSIS — Z992 Dependence on renal dialysis: Secondary | ICD-10-CM | POA: Diagnosis not present

## 2020-12-09 DIAGNOSIS — R0602 Shortness of breath: Secondary | ICD-10-CM | POA: Diagnosis not present

## 2020-12-09 DIAGNOSIS — E785 Hyperlipidemia, unspecified: Secondary | ICD-10-CM | POA: Diagnosis present

## 2020-12-09 DIAGNOSIS — D61818 Other pancytopenia: Secondary | ICD-10-CM | POA: Diagnosis present

## 2020-12-09 DIAGNOSIS — Z20822 Contact with and (suspected) exposure to covid-19: Secondary | ICD-10-CM | POA: Diagnosis present

## 2020-12-09 DIAGNOSIS — Z79899 Other long term (current) drug therapy: Secondary | ICD-10-CM

## 2020-12-09 DIAGNOSIS — I2510 Atherosclerotic heart disease of native coronary artery without angina pectoris: Secondary | ICD-10-CM | POA: Diagnosis present

## 2020-12-09 DIAGNOSIS — G9341 Metabolic encephalopathy: Secondary | ICD-10-CM | POA: Diagnosis present

## 2020-12-09 DIAGNOSIS — J811 Chronic pulmonary edema: Secondary | ICD-10-CM | POA: Diagnosis present

## 2020-12-09 DIAGNOSIS — E160 Drug-induced hypoglycemia without coma: Secondary | ICD-10-CM | POA: Diagnosis not present

## 2020-12-09 DIAGNOSIS — Z634 Disappearance and death of family member: Secondary | ICD-10-CM | POA: Diagnosis not present

## 2020-12-09 DIAGNOSIS — R778 Other specified abnormalities of plasma proteins: Secondary | ICD-10-CM | POA: Diagnosis not present

## 2020-12-09 DIAGNOSIS — E8779 Other fluid overload: Secondary | ICD-10-CM | POA: Diagnosis not present

## 2020-12-09 DIAGNOSIS — I484 Atypical atrial flutter: Secondary | ICD-10-CM | POA: Diagnosis not present

## 2020-12-09 DIAGNOSIS — I480 Paroxysmal atrial fibrillation: Secondary | ICD-10-CM | POA: Diagnosis present

## 2020-12-09 DIAGNOSIS — I739 Peripheral vascular disease, unspecified: Secondary | ICD-10-CM | POA: Diagnosis present

## 2020-12-09 DIAGNOSIS — Z7901 Long term (current) use of anticoagulants: Secondary | ICD-10-CM

## 2020-12-09 LAB — CBG MONITORING, ED
Glucose-Capillary: 39 mg/dL — CL (ref 70–99)
Glucose-Capillary: 55 mg/dL — ABNORMAL LOW (ref 70–99)
Glucose-Capillary: 127 mg/dL — ABNORMAL HIGH (ref 70–99)
Glucose-Capillary: 22 mg/dL — CL (ref 70–99)
Glucose-Capillary: 74 mg/dL (ref 70–99)
Glucose-Capillary: 104 mg/dL — ABNORMAL HIGH (ref 70–99)

## 2020-12-09 LAB — BASIC METABOLIC PANEL
Anion gap: >20 — ABNORMAL HIGH (ref 5–15)
BUN: 135 mg/dL — ABNORMAL HIGH (ref 8–23)
CO2: 19 mmol/L — ABNORMAL LOW (ref 22–32)
Calcium: 8.9 mg/dL (ref 8.9–10.3)
Chloride: 97 mmol/L — ABNORMAL LOW (ref 98–111)
Creatinine, Ser: 20.33 mg/dL — ABNORMAL HIGH (ref 0.61–1.24)
GFR, Estimated: 2 mL/min — ABNORMAL LOW (ref >60)
Glucose, Bld: 146 mg/dL — ABNORMAL HIGH (ref 70–99)
Potassium: 6.7 mmol/L (ref 3.5–5.1)
Sodium: 138 mmol/L (ref 135–145)

## 2020-12-09 LAB — CBC WITH DIFFERENTIAL/PLATELET
Abs Immature Granulocytes: 0.01 10*3/uL (ref 0.00–0.07)
Basophils Absolute: 0 10*3/uL (ref 0.0–0.1)
Basophils Relative: 0 %
Eosinophils Absolute: 0 10*3/uL (ref 0.0–0.5)
Eosinophils Relative: 0 %
HCT: 35.5 % — ABNORMAL LOW (ref 39.0–52.0)
Hemoglobin: 11 g/dL — ABNORMAL LOW (ref 13.0–17.0)
Immature Granulocytes: 0 %
Lymphocytes Relative: 10 %
Lymphs Abs: 0.3 10*3/uL — ABNORMAL LOW (ref 0.7–4.0)
MCH: 32.5 pg (ref 26.0–34.0)
MCHC: 31 g/dL (ref 30.0–36.0)
MCV: 105 fL — ABNORMAL HIGH (ref 80.0–100.0)
Monocytes Absolute: 0.3 10*3/uL (ref 0.1–1.0)
Monocytes Relative: 10 %
Neutro Abs: 2.2 10*3/uL (ref 1.7–7.7)
Neutrophils Relative %: 80 %
Platelets: 96 10*3/uL — ABNORMAL LOW (ref 150–400)
RBC: 3.38 MIL/uL — ABNORMAL LOW (ref 4.22–5.81)
RDW: 20.3 % — ABNORMAL HIGH (ref 11.5–15.5)
WBC: 2.8 10*3/uL — ABNORMAL LOW (ref 4.0–10.5)
nRBC: 0 % (ref 0.0–0.2)

## 2020-12-09 LAB — TROPONIN I (HIGH SENSITIVITY)
Troponin I (High Sensitivity): 41 ng/L — ABNORMAL HIGH (ref <18)
Troponin I (High Sensitivity): 42 ng/L — ABNORMAL HIGH (ref <18)

## 2020-12-09 LAB — RESP PANEL BY RT-PCR (FLU A&B, COVID) ARPGX2
Influenza A by PCR: NEGATIVE
Influenza B by PCR: NEGATIVE
SARS Coronavirus 2 by RT PCR: NEGATIVE

## 2020-12-09 LAB — HEPATITIS B SURFACE ANTIGEN: Hepatitis B Surface Ag: NONREACTIVE

## 2020-12-09 LAB — TSH: TSH: 2.734 u[IU]/mL (ref 0.350–4.500)

## 2020-12-09 LAB — HEPATITIS B CORE ANTIBODY, TOTAL: Hep B Core Total Ab: NONREACTIVE

## 2020-12-09 LAB — CORTISOL-PM, BLOOD: Cortisol - PM: 11 ug/dL — ABNORMAL HIGH (ref <10.0)

## 2020-12-09 IMAGING — DX DG CHEST 2V
2 series · 2 of 2 positions shown · non-contrast
Comparison: One-view chest x-ray [DATE]

CLINICAL DATA: Shortness of breath.  Hypoxia.

EXAM:
CHEST - 2 VIEW

[chest pa]
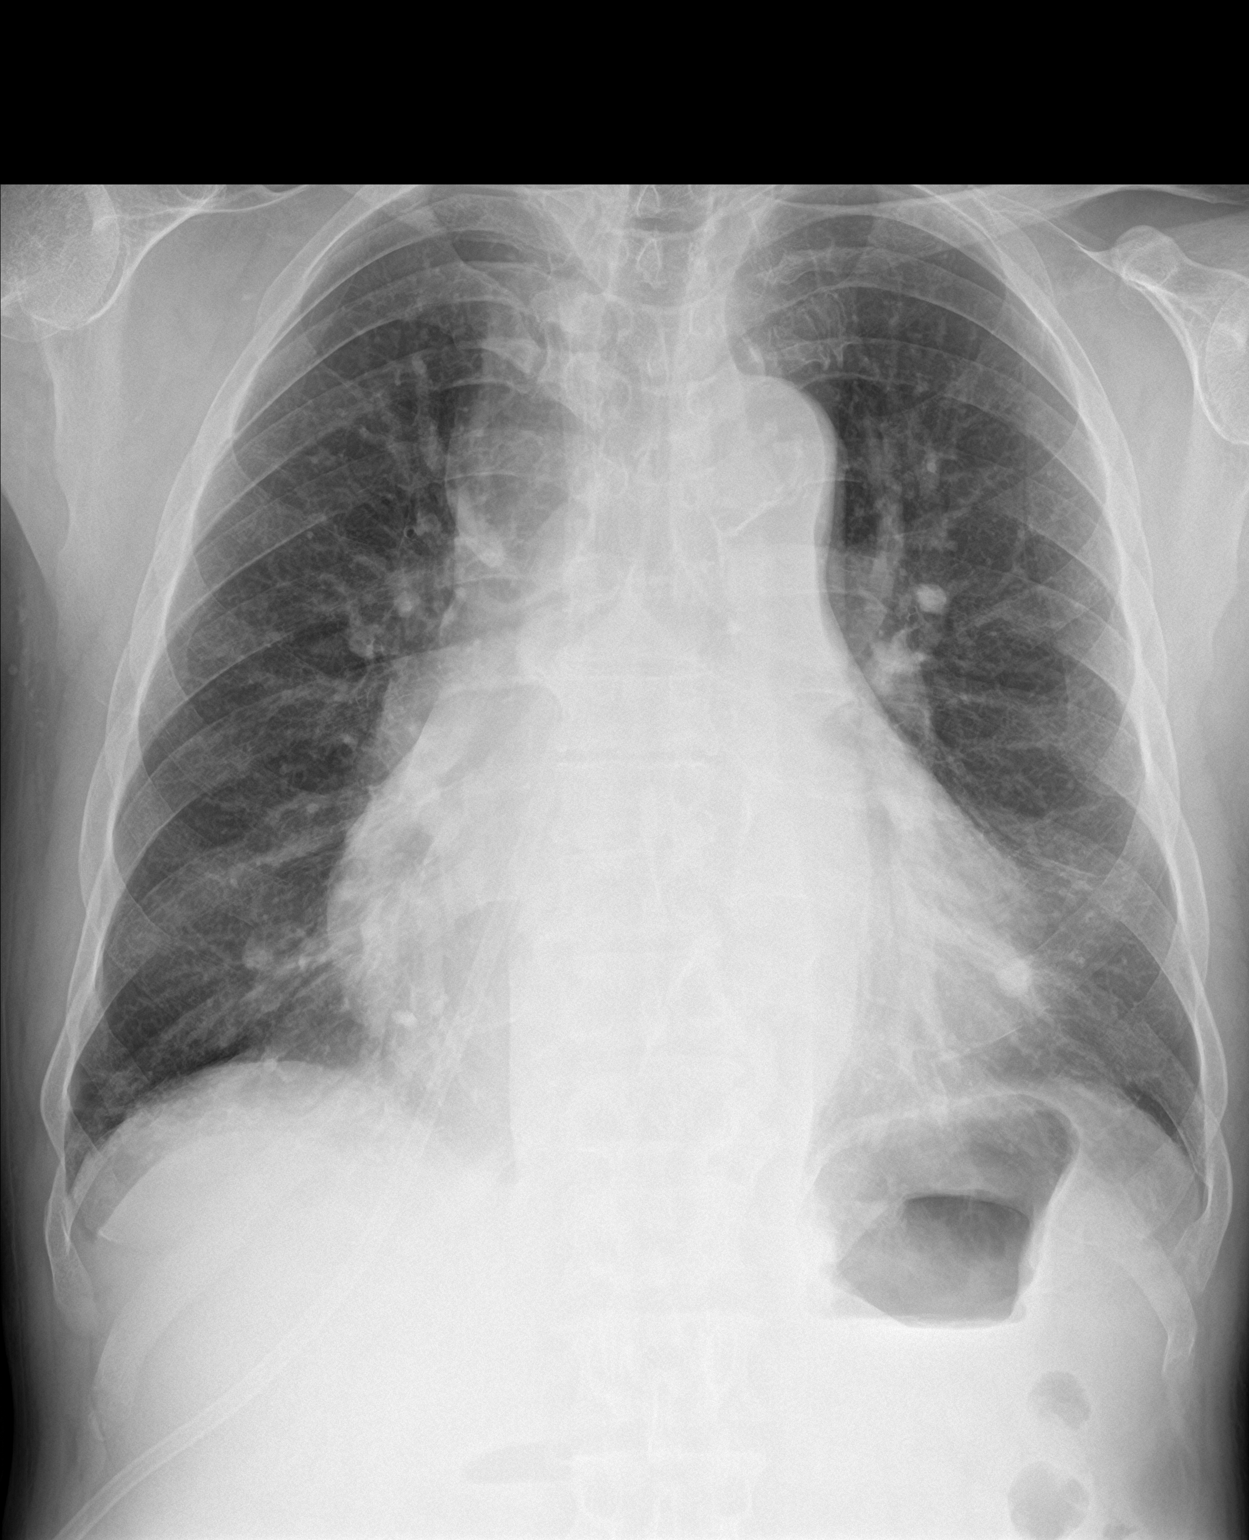

[chest lat]
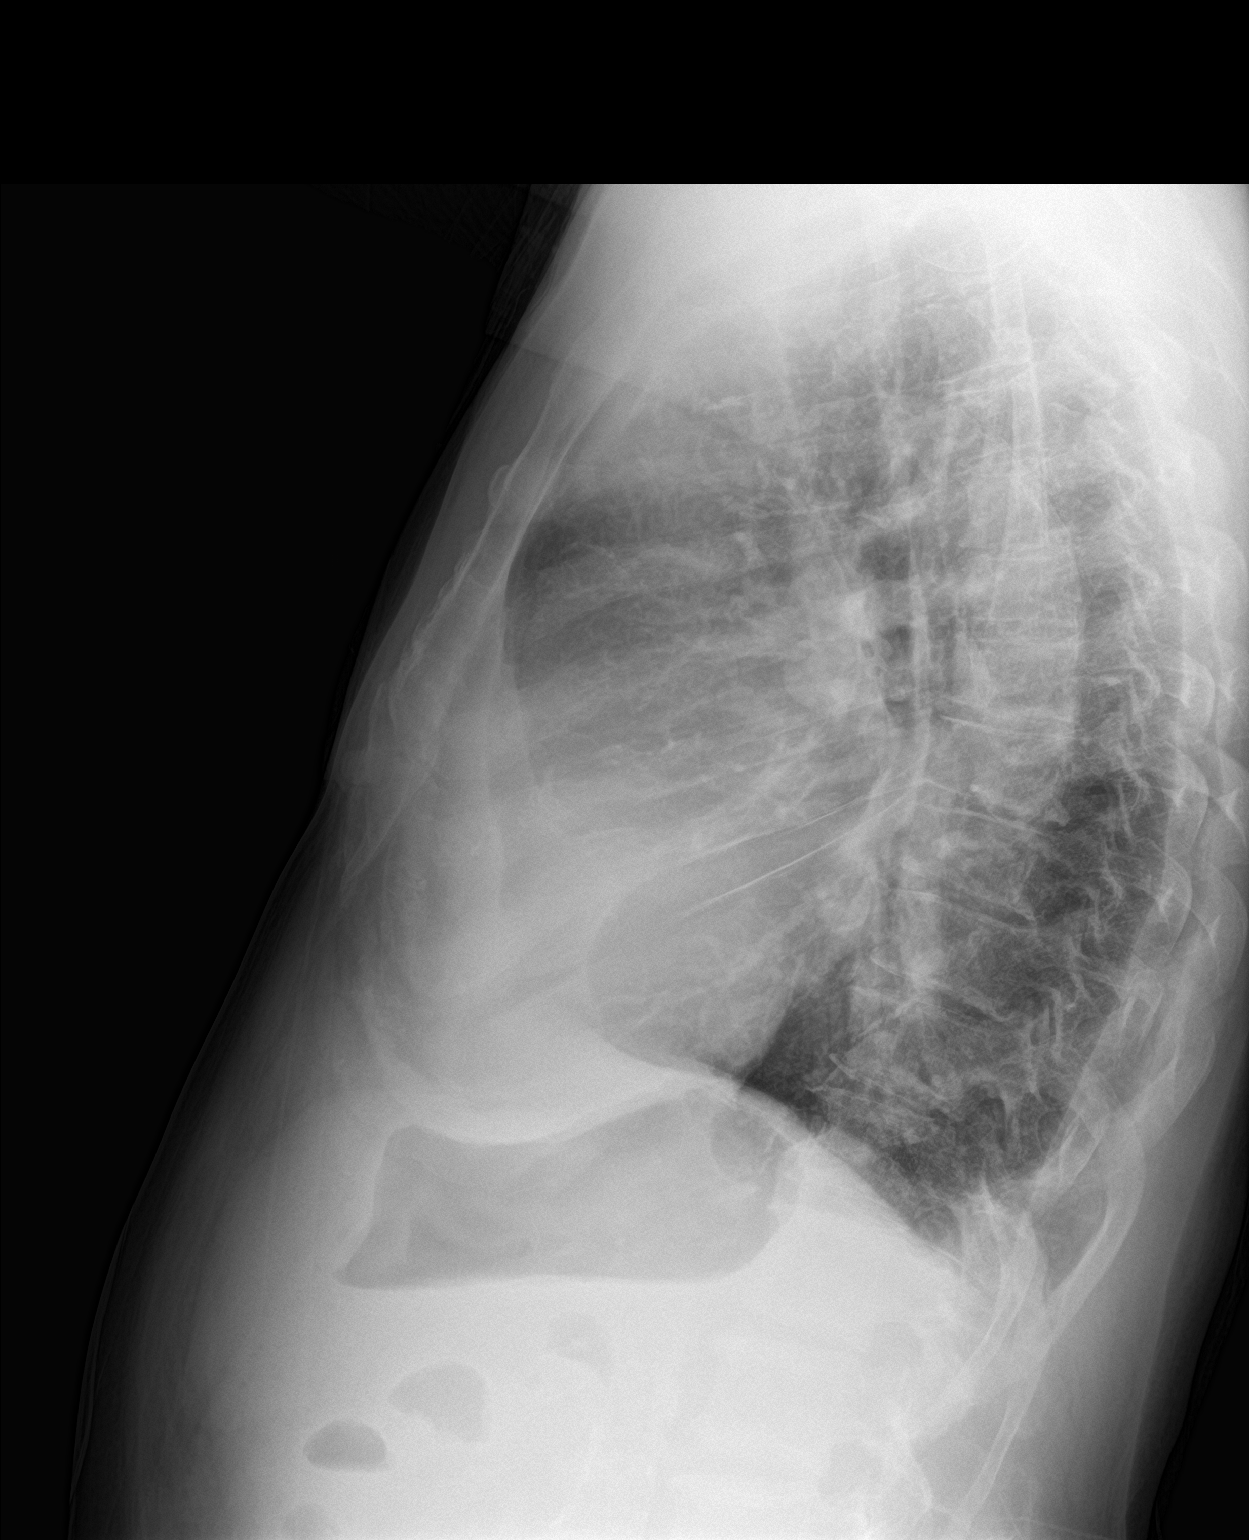

[2 of 2 positions shown; findings below may reference images not displayed]

FINDINGS: Heart is enlarged. Mild pulmonary vascular congestion noted.
Significant effusion. No airspace disease is present. Axial skeleton
is unremarkable.
IMPRESSION: Cardiomegaly and mild pulmonary vascular congestion.

## 2020-12-09 MED ORDER — AMIODARONE HCL 200 MG PO TABS
200.0000 mg | ORAL_TABLET | Freq: Every day | ORAL | Status: DC
  Administered 2020-12-09 – 2020-12-11 (×3): 200 mg via ORAL
  Filled 2020-12-09 (×3): qty 1

## 2020-12-09 MED ORDER — HEPARIN SODIUM (PORCINE) 5000 UNIT/ML IJ SOLN
5000.0000 [IU] | Freq: Three times a day (TID) | INTRAMUSCULAR | Status: DC
  Administered 2020-12-09 – 2020-12-11 (×5): 5000 [IU] via SUBCUTANEOUS
  Filled 2020-12-09 (×5): qty 1

## 2020-12-09 MED ORDER — CLOPIDOGREL BISULFATE 75 MG PO TABS
75.0000 mg | ORAL_TABLET | Freq: Every day | ORAL | Status: DC
  Administered 2020-12-09 – 2020-12-11 (×3): 75 mg via ORAL
  Filled 2020-12-09 (×3): qty 1

## 2020-12-09 MED ORDER — DEXTROSE 50 % IV SOLN
1.0000 | Freq: Once | INTRAVENOUS | Status: AC
  Administered 2020-12-09: 50 mL via INTRAVENOUS
  Filled 2020-12-09: qty 50

## 2020-12-09 MED ORDER — HYDRALAZINE HCL 25 MG PO TABS
25.0000 mg | ORAL_TABLET | Freq: Four times a day (QID) | ORAL | Status: DC | PRN
  Administered 2020-12-09 – 2020-12-10 (×2): 25 mg via ORAL
  Filled 2020-12-09 (×3): qty 1

## 2020-12-09 MED ORDER — CHLORHEXIDINE GLUCONATE CLOTH 2 % EX PADS: 6.0000 | MEDICATED_PAD | Freq: Every day | CUTANEOUS | Status: DC

## 2020-12-09 MED ORDER — CALCITRIOL 0.5 MCG PO CAPS
2.2500 ug | ORAL_CAPSULE | ORAL | Status: DC
  Administered 2020-12-10: 2.25 ug via ORAL

## 2020-12-09 MED ORDER — PANTOPRAZOLE SODIUM 40 MG PO TBEC: 40.0000 mg | DELAYED_RELEASE_TABLET | Freq: Every day | ORAL | Status: DC

## 2020-12-09 MED ORDER — DEXTROSE 50 % IV SOLN
INTRAVENOUS | Status: AC
  Filled 2020-12-09: qty 50

## 2020-12-09 MED ORDER — SODIUM BICARBONATE 650 MG PO TABS
650.0000 mg | ORAL_TABLET | Freq: Two times a day (BID) | ORAL | Status: DC
  Administered 2020-12-10 (×2): 650 mg via ORAL
  Filled 2020-12-09 (×5): qty 1

## 2020-12-09 MED ORDER — ALBUTEROL SULFATE HFA 108 (90 BASE) MCG/ACT IN AERS
4.0000 | INHALATION_SPRAY | Freq: Once | RESPIRATORY_TRACT | Status: AC
  Administered 2020-12-09: 4 via RESPIRATORY_TRACT
  Filled 2020-12-09: qty 6.7

## 2020-12-09 MED ORDER — ALBUTEROL SULFATE (2.5 MG/3ML) 0.083% IN NEBU: 2.5000 mg | INHALATION_SOLUTION | Freq: Four times a day (QID) | RESPIRATORY_TRACT | Status: DC | PRN

## 2020-12-09 MED ORDER — SEVELAMER CARBONATE 800 MG PO TABS
4000.0000 mg | ORAL_TABLET | ORAL | Status: DC
  Administered 2020-12-10: 1600 mg via ORAL
  Administered 2020-12-11: 4000 mg via ORAL
  Filled 2020-12-09 (×2): qty 5

## 2020-12-09 MED ORDER — DEXTROSE-NACL 5-0.45 % IV SOLN: INTRAVENOUS | Status: DC

## 2020-12-09 MED ORDER — CINACALCET HCL 30 MG PO TABS: 180.0000 mg | ORAL_TABLET | ORAL | Status: DC

## 2020-12-09 MED ORDER — ACETAMINOPHEN 650 MG RE SUPP: 650.0000 mg | Freq: Four times a day (QID) | RECTAL | Status: DC | PRN

## 2020-12-09 MED ORDER — INSULIN ASPART 100 UNIT/ML IV SOLN
5.0000 [IU] | Freq: Once | INTRAVENOUS | Status: AC
  Administered 2020-12-09: 5 [IU] via INTRAVENOUS

## 2020-12-09 MED ORDER — ROSUVASTATIN CALCIUM 5 MG PO TABS
10.0000 mg | ORAL_TABLET | Freq: Every day | ORAL | Status: DC
  Administered 2020-12-09 – 2020-12-11 (×3): 10 mg via ORAL
  Filled 2020-12-09 (×3): qty 2

## 2020-12-09 MED ORDER — DEXTROSE 50 % IV SOLN
1.0000 | Freq: Once | INTRAVENOUS | Status: AC
  Administered 2020-12-09: 50 mL via INTRAVENOUS

## 2020-12-09 MED ORDER — SEVELAMER CARBONATE 800 MG PO TABS
3200.0000 mg | ORAL_TABLET | Freq: Three times a day (TID) | ORAL | Status: DC
  Administered 2020-12-09 – 2020-12-11 (×4): 3200 mg via ORAL
  Filled 2020-12-09 (×5): qty 4

## 2020-12-09 MED ORDER — ACETAMINOPHEN 325 MG PO TABS: 650.0000 mg | ORAL_TABLET | Freq: Four times a day (QID) | ORAL | Status: DC | PRN

## 2020-12-09 MED ORDER — SODIUM ZIRCONIUM CYCLOSILICATE 10 G PO PACK
10.0000 g | PACK | Freq: Once | ORAL | Status: AC
  Administered 2020-12-09: 10 g via ORAL
  Filled 2020-12-09: qty 1

## 2020-12-09 NOTE — ED Provider Notes (Signed)
Raymond [name]   [id number] 12/09/20 Arrival Time: 0109  ASSESSMENT & PLAN:  1. SOB (shortness of breath)   2. Hypoxia   3. Hypoglycemia    To ED via EMS. Stable upon discharge. Alert and oriented despite hypoglycemia.  Meds ordered this encounter  Medications  . dextrose 50 % solution 50 mL  . dextrose 5 %-0.45 % sodium chloride infusion   SpO2 increased to >94% on 2L [place].  ECG: Performed today and interpreted by me: sinus arrhythmia; new upright T waves in leads V4-V6 when compared to ECG dated 07/28/2020; no STEMI.  CXR: I have personally viewed the imaging studies ordered this visit. Cardiomegaly; no acute infiltrates. No pneumothorax.  Labs Reviewed  CBG MONITORING, ED - Abnormal; Notable for the following components:      Result Value   Glucose-Capillary 55 (*)    All other components within normal limits  CBG MONITORING, ED - Abnormal; Notable for the following components:   Glucose-Capillary 39 (*)    All other components within normal limits  CBG MONITORING, ED - Abnormal; Notable for the following components:   Glucose-Capillary 22 (*)    All other components within normal limits   Reviewed expectations re: course of current medical issues. Questions answered. Outlined signs and symptoms indicating need for more acute intervention. Patient verbalized understanding. After Visit Summary given.   SUBJECTIVE: History from: patient. Poor historian.  Raymond [name] is a 65 y.o. male with PMH of CAD, COPD, ESRD on dialysis, paroxysmal atrial fibrillation who presents with complaint of feeling SOB. Abrupt onset; yesterday; with assoc lightheadedness/weakness and nausea without emesis. Also reports occas blurry vision with occas tingling in hands; none currently. Denies assoc CP. No headaches. Afebrile. No recent illnesses. Ambulatory without assistance. Normal PO intake.  Reports cocaine use approx 2 mo ago.  Denies: lower extremity edema, orthopnea,  palpitations, paroxysmal nocturnal dyspnea and syncope.  No new medications. Reports taking prescribed medications as directed.   Social History   Tobacco Use  Smoking Status Current Every Day Smoker  . Packs/day: 0.50  . Years: 25.00  . Pack years: 12.50  . Types: Cigarettes  . Last attempt to quit: 08/01/2011  . Years since quitting: 9.3  Smokeless Tobacco Never Used  Tobacco Comment   he smokes 6 cigarettes a week   Social History   Substance and Sexual Activity  Alcohol Use Yes   Comment: occasionally/ one half pint     OBJECTIVE:  Vitals:   12/09/20 1033 12/09/20 1036  BP: (!) 153/99   Pulse: 93   Resp:  (!) 24  Temp: 97.7 F (36.5 C)   TempSrc: Oral   SpO2: (!) 88%     General appearance: alert, oriented, no acute distress but appears fatigued. Eyes: PERRLA; EOMI; conjunctivae normal HENT: normocephalic; atraumatic Neck: supple with FROM Lungs: tachypnea; without labored respirations; speaks full sentences without difficulty; CTAB Heart: regular Chest Wall: without tenderness to palpation Abdomen: soft, non-tender; no guarding or rebound tenderness Extremities: without edema; without calf swelling or tenderness; symmetrical without gross deformities Skin: warm and dry; without rash or lesions Neuro: normal gait Psychological: alert and cooperative; normal mood and affect  Imaging: DG Chest 2 View  Result Date: 12/09/2020 CLINICAL DATA:  Shortness of breath.  Hypoxia. EXAM: CHEST - 2 VIEW COMPARISON:  One-view chest x-ray 07/28/2020 FINDINGS: Heart is enlarged. Mild pulmonary vascular congestion noted. Significant effusion. No airspace disease is present. Axial skeleton is unremarkable. IMPRESSION: Cardiomegaly and mild pulmonary vascular congestion.  Electronically Signed   By: San Morelle M.D.   On: 12/09/2020 11:06    No Known Allergies  Past Medical History:  Diagnosis Date  . Anemia   . CAD (coronary artery disease)    a. 03/2018: cath  showing 20 to 30% stenosis along the LAD, luminal irregularities along the RCA, and angiographically normal LCx  . COPD (chronic obstructive pulmonary disease) ([place])   . ESRD (end stage renal disease) on dialysis [facility])    "MWF; Jeneen Rinks" (07/22/17)  . Hepatitis C    Still positive s/p liver biopsy at [facility]  and interferon therapy for 6 months. Most recent lab work was on 10/24/12  . Hepatitis C    "took the tx; gone now" (12/05/2016)  Was treated  . History of blood transfusion ~ 2012/2013   "related to my kidneys; blood was low"  . Hypertension   . Peripheral arterial disease ([place])   . Substance abuse ([place])   . Thyroid disease    Social History   Socioeconomic History  . Marital status: Single    Spouse name: Not on file  . Number of children: Not on file  . Years of education: Not on file  . Highest education level: Not on file  Occupational History  . Not on file  Tobacco Use  . Smoking status: Current Every Day Smoker    Packs/day: 0.50    Years: 25.00    Pack years: 12.50    Types: Cigarettes    Last attempt to quit: 08/01/2011    Years since quitting: 9.3  . Smokeless tobacco: Never Used  . Tobacco comment: he smokes 6 cigarettes a week  Vaping Use  . Vaping Use: Never used  Substance and Sexual Activity  . Alcohol use: Yes    Comment: occasionally/ one half pint  . Drug use: Not Currently    Types: Cocaine  . Sexual activity: Not on file  Other Topics Concern  . Not on file  Social History Narrative   Pt. Is homeless.  He lives in shelters and motels when he can afford.     Social Determinants of Health   Financial Resource Strain: Not on file  Food Insecurity: Not on file  Transportation Needs: Not on file  Physical Activity: Not on file  Stress: Not on file  Social Connections: Not on file  Intimate Partner Violence: Not on file   Family History  Problem Relation Age of Onset  . Diabetes Father   . Hypertension Father   . Heart disease Father     Past Surgical History:  Procedure Laterality Date  . ABDOMINAL AORTOGRAM W/LOWER EXTREMITY N/A 02/08/2018   Procedure: ABDOMINAL AORTOGRAM W/LOWER EXTREMITY;  Surgeon: Conrad Sims, MD;  Location: [place] CV LAB;  Service: Cardiovascular;  Laterality: N/A;  . ABDOMINAL AORTOGRAM W/LOWER EXTREMITY Bilateral 06/15/2020   Procedure: ABDOMINAL AORTOGRAM W/LOWER EXTREMITY;  Surgeon: Waynetta Sandy, MD;  Location: [place] CV LAB;  Service: Cardiovascular;  Laterality: Bilateral;  . AV FISTULA PLACEMENT Left Aug. 2013 ?  [name] BIOPSY  01/17/2020   Procedure: BIOPSY;  Surgeon: Jackquline Denmark, MD;  Location: [place];  Service: Endoscopy;;  . COLONOSCOPY WITH PROPOFOL N/A 08/21/2018   Procedure: COLONOSCOPY WITH PROPOFOL;  Surgeon: Yetta Flock, MD;  Location: [place];  Service: Gastroenterology;  Laterality: N/A;  . ENTEROSCOPY N/A 01/17/2020   Procedure: ENTEROSCOPY;  Surgeon: Jackquline Denmark, MD;  Location: [facility] ENDOSCOPY;  Service: Endoscopy;  Laterality: N/A;  . ENTEROSCOPY  N/A 07/31/2020   Procedure: ENTEROSCOPY;  Surgeon: Doran Stabler, MD;  Location: [place];  Service: Gastroenterology;  Laterality: N/A;  . ESOPHAGOGASTRODUODENOSCOPY (EGD) WITH PROPOFOL N/A 08/20/2018   Procedure: ESOPHAGOGASTRODUODENOSCOPY (EGD) WITH PROPOFOL;  Surgeon: Gatha Mayer, MD;  Location: [place];  Service: Endoscopy;  Laterality: N/A;  . FEMORAL-POPLITEAL BYPASS GRAFT Left 04/11/2018   Procedure: BYPASS GRAFT FEMORAL-POPLITEAL ARTERY LEFT LEG;  Surgeon: Rosetta Posner, MD;  Location: [place];  Service: Vascular;  Laterality: Left;  . GIVENS CAPSULE STUDY N/A 07/31/2020   Procedure: GIVENS CAPSULE STUDY;  Surgeon: Doran Stabler, MD;  Location: [place];  Service: Gastroenterology;  Laterality: N/A;  . HOT HEMOSTASIS N/A 08/20/2018   Procedure: HOT HEMOSTASIS (ARGON PLASMA COAGULATION/BICAP);  Surgeon: Gatha Mayer, MD;  Location: [facility] ENDOSCOPY;  Service: Endoscopy;   Laterality: N/A;  . HOT HEMOSTASIS N/A 01/17/2020   Procedure: HOT HEMOSTASIS (ARGON PLASMA COAGULATION/BICAP);  Surgeon: Jackquline Denmark, MD;  Location: [facility] ENDOSCOPY;  Service: Endoscopy;  Laterality: N/A;  . LEFT HEART CATH AND CORONARY ANGIOGRAPHY N/A 03/29/2018   Procedure: LEFT HEART CATH AND CORONARY ANGIOGRAPHY;  Surgeon: Nelva Bush, MD;  Location: [place] CV LAB;  Service: Cardiovascular;  Laterality: N/A;  . LIVER BIOPSY    . ORIF ULNAR FRACTURE Left 05/18/2017   Procedure: OPEN REDUCTION INTERNAL FIXATION (ORIF) ULNAR FRACTURE;  Surgeon: Iran Planas, MD;  Location: [place];  Service: Orthopedics;  Laterality: Left;  . PERIPHERAL VASCULAR INTERVENTION Right 06/15/2020   Procedure: PERIPHERAL VASCULAR INTERVENTION;  Surgeon: Waynetta Sandy, MD;  Location: [place] CV LAB;  Service: Cardiovascular;  Laterality: Right;  . POLYPECTOMY  08/21/2018   Procedure: POLYPECTOMY;  Surgeon: Yetta Flock, MD;  Location: [facility] ENDOSCOPY;  Service: Gastroenterology;;  . SHUNTOGRAM N/A 12/11/2012   Procedure: Earney Mallet;  Surgeon: Serafina Mitchell, MD;  Location: [facility] CATH LAB;  Service: Cardiovascular;  Laterality: N/A;  . WOUND EXPLORATION Left 06/15/2020   Procedure: GROIN  EXPLORATION;  Surgeon: Waynetta Sandy, MD;  Location: [place];  Service: Vascular;  Laterality: Left;     Vanessa Kick, MD 12/09/20 1147

## 2020-12-09 NOTE — Consult Note (Addendum)
[facility] Renal Consultation Note    Indication for Consultation:  Management of ESRD/hemodialysis, anemia, hypertension/volume, and secondary hyperparathyroidism.  HPI: Raymond [name] is a 65 y.o. male with PMH including ESRD on dialysis, CAD, hep C, HTN, and PAD. Patient presented to the ED with shortness of breath and chest pain after not attending dialysis since 12/01/20. He reports his sister passed away and he has been "dealing with a lot." He originally went to urgent care but was noted to hypoxic and hypoglycemic, so he was given D50 and transferred to the ED. VS notable for significant hypertension, SpO2 mid-high 90's on RA, K+ 6.7, Glu 146, BUN 135 and Cr 20.33. He was given albuterol, insulin, dextrose and lokelma in the ED for his hyperkalemia, and nephrology consulted for urgent HD.  At time of exam, patient reports he is feeling better. No shortness of breath or chest pain at present. Reports he was dizzy earlier but it is resolved. Denies confusion, abdominal pain, nausea, vomiting, muscle twitching, and weakness. Denies peripheral edema. No recent fevers, chills or headache. Reports he has been taking his home medications and appetite is stable.   Past Medical History:  Diagnosis Date  . Anemia   . CAD (coronary artery disease)    a. 03/2018: cath showing 20 to 30% stenosis along the LAD, luminal irregularities along the RCA, and angiographically normal LCx  . COPD (chronic obstructive pulmonary disease) ([place])   . ESRD (end stage renal disease) on dialysis [facility])    "MWF; Jeneen Rinks" (07/22/17)  . Hepatitis C    Still positive s/p liver biopsy at [facility]  and interferon therapy for 6 months. Most recent lab work was on 10/24/12  . Hepatitis C    "took the tx; gone now" (12/05/2016)  Was treated  . History of blood transfusion ~ 2012/2013   "related to my kidneys; blood was low"  . Hypertension   . Peripheral arterial disease ([place])   . Substance abuse ([place])   .  Thyroid disease    Past Surgical History:  Procedure Laterality Date  . ABDOMINAL AORTOGRAM W/LOWER EXTREMITY N/A 02/08/2018   Procedure: ABDOMINAL AORTOGRAM W/LOWER EXTREMITY;  Surgeon: Conrad [place], MD;  Location: [place] CV LAB;  Service: Cardiovascular;  Laterality: N/A;  . ABDOMINAL AORTOGRAM W/LOWER EXTREMITY Bilateral 06/15/2020   Procedure: ABDOMINAL AORTOGRAM W/LOWER EXTREMITY;  Surgeon: Waynetta Sandy, MD;  Location: [place] CV LAB;  Service: Cardiovascular;  Laterality: Bilateral;  . AV FISTULA PLACEMENT Left Aug. 2013 ?  [name] BIOPSY  01/17/2020   Procedure: BIOPSY;  Surgeon: Jackquline Denmark, MD;  Location: [place];  Service: Endoscopy;;  . COLONOSCOPY WITH PROPOFOL N/A 08/21/2018   Procedure: COLONOSCOPY WITH PROPOFOL;  Surgeon: Yetta Flock, MD;  Location: [place];  Service: Gastroenterology;  Laterality: N/A;  . ENTEROSCOPY N/A 01/17/2020   Procedure: ENTEROSCOPY;  Surgeon: Jackquline Denmark, MD;  Location: [facility] ENDOSCOPY;  Service: Endoscopy;  Laterality: N/A;  . ENTEROSCOPY N/A 07/31/2020   Procedure: ENTEROSCOPY;  Surgeon: Doran Stabler, MD;  Location: [facility] ENDOSCOPY;  Service: Gastroenterology;  Laterality: N/A;  . ESOPHAGOGASTRODUODENOSCOPY (EGD) WITH PROPOFOL N/A 08/20/2018   Procedure: ESOPHAGOGASTRODUODENOSCOPY (EGD) WITH PROPOFOL;  Surgeon: Gatha Mayer, MD;  Location: [place];  Service: Endoscopy;  Laterality: N/A;  . FEMORAL-POPLITEAL BYPASS GRAFT Left 04/11/2018   Procedure: BYPASS GRAFT FEMORAL-POPLITEAL ARTERY LEFT LEG;  Surgeon: Rosetta Posner, MD;  Location: [place];  Service: Vascular;  Laterality: Left;  . GIVENS CAPSULE STUDY  N/A 07/31/2020   Procedure: GIVENS CAPSULE STUDY;  Surgeon: Doran Stabler, MD;  Location: [place];  Service: Gastroenterology;  Laterality: N/A;  . HOT HEMOSTASIS N/A 08/20/2018   Procedure: HOT HEMOSTASIS (ARGON PLASMA COAGULATION/BICAP);  Surgeon: Gatha Mayer, MD;  Location: [facility] ENDOSCOPY;  Service:  Endoscopy;  Laterality: N/A;  . HOT HEMOSTASIS N/A 01/17/2020   Procedure: HOT HEMOSTASIS (ARGON PLASMA COAGULATION/BICAP);  Surgeon: Jackquline Denmark, MD;  Location: [facility] ENDOSCOPY;  Service: Endoscopy;  Laterality: N/A;  . LEFT HEART CATH AND CORONARY ANGIOGRAPHY N/A 03/29/2018   Procedure: LEFT HEART CATH AND CORONARY ANGIOGRAPHY;  Surgeon: Nelva Bush, MD;  Location: [place] CV LAB;  Service: Cardiovascular;  Laterality: N/A;  . LIVER BIOPSY    . ORIF ULNAR FRACTURE Left 05/18/2017   Procedure: OPEN REDUCTION INTERNAL FIXATION (ORIF) ULNAR FRACTURE;  Surgeon: Iran Planas, MD;  Location: [place];  Service: Orthopedics;  Laterality: Left;  . PERIPHERAL VASCULAR INTERVENTION Right 06/15/2020   Procedure: PERIPHERAL VASCULAR INTERVENTION;  Surgeon: Waynetta Sandy, MD;  Location: [place] CV LAB;  Service: Cardiovascular;  Laterality: Right;  . POLYPECTOMY  08/21/2018   Procedure: POLYPECTOMY;  Surgeon: Yetta Flock, MD;  Location: [facility] ENDOSCOPY;  Service: Gastroenterology;;  . SHUNTOGRAM N/A 12/11/2012   Procedure: Earney Mallet;  Surgeon: Serafina Mitchell, MD;  Location: [facility] CATH LAB;  Service: Cardiovascular;  Laterality: N/A;  . WOUND EXPLORATION Left 06/15/2020   Procedure: GROIN  EXPLORATION;  Surgeon: Waynetta Sandy, MD;  Location: [facility] OR;  Service: Vascular;  Laterality: Left;   Family History  Problem Relation Age of Onset  . Diabetes Father   . Hypertension Father   . Heart disease Father    Social History:  reports that he has been smoking cigarettes. He has a 12.50 pack-year smoking history. He has never used smokeless tobacco. He reports current alcohol use. He reports previous drug use. Drug: Cocaine.  ROS: As per HPI otherwise negative  Physical Exam: Vitals:   12/09/20 1227 12/09/20 1228 12/09/20 1230 12/09/20 1415  BP:  (!) 195/123 (!) 172/120 (!) 194/124  Pulse: 100  100 97  Resp: (!) 23  (!) 22 20  Temp:  97.9 F (36.6 C)    TempSrc:  Oral     SpO2: 96%  96% 96%     General: Well developed, well nourished, in no acute distress. Head: Normocephalic, atraumatic, sclera non-icteric, mucus membranes are moist. Neck: JVD not elevated. Lungs: + crackles left lower lobe.  Breathing is unlabored on RA. Heart: RRR with normal S1, S2. No murmurs, rubs, or gallops appreciated. Abdomen: Soft, non-tender, non-distended with normoactive bowel sounds. No rebound/guarding. No obvious abdominal masses. Musculoskeletal:  Strength and tone appear normal for age. Lower extremities: trace pitting edema bilateral lower extremity Neuro: Alert and oriented X 3. Moves all extremities spontaneously. No asterixis Psych:  Responds to questions appropriately with a normal affect. Dialysis Access: LUE AVF + thrill and bruit, multiple large aneurysms noted  No Known Allergies Prior to Admission medications   Medication Sig Start Date End Date Taking? Authorizing Provider  albuterol (VENTOLIN HFA) 108 (90 Base) MCG/ACT inhaler Inhale 2 puffs into the lungs every 4 (four) hours as needed for shortness of breath.  11/06/19   [provider]  amiodarone (PACERONE) 200 MG tablet TAKE TWO TABLETS ONCE A DAY FOR 7 DAYS. STARTING ON 06/30/20 TAKE ONLY ONE TABLET DAILY 06/22/20 06/22/21  Baglia, Corrina, PA-C  amLODipine (NORVASC) 2.5 MG tablet Take 2.5 mg by  mouth at bedtime.  01/01/20   [provider]  bismuth subsalicylate (PEPTO BISMOL) 262 MG chewable tablet Chew 524 mg by mouth as needed for indigestion or diarrhea or loose stools.    [provider]  clopidogrel (PLAVIX) 75 MG tablet TAKE 1 TABLET (75 MG TOTAL) BY MOUTH DAILY. 06/22/20 06/22/21  Baglia, Corrina, PA-C  Methoxy PEG-Epoetin Beta (MIRCERA IJ) Mircera 07/09/20 07/08/21  [provider]  oxyCODONE (OXY IR/ROXICODONE) 5 MG immediate release tablet Take 5 mg by mouth 3 (three) times daily as needed. 07/01/20   [provider]  oxyCODONE-acetaminophen  (PERCOCET) 5-325 MG tablet Take 1 tablet by mouth every 6 (six) hours as needed for severe pain. 07/01/20 07/01/21  Gabriel Earing, PA-C  pantoprazole (PROTONIX) 40 MG tablet Take 1 tablet (40 mg total) by mouth daily. 01/18/20   Ghimire, Henreitta Leber, MD  QUEtiapine (SEROQUEL) 100 MG tablet Take 100 mg by mouth at bedtime as needed (sleep).  01/01/20   [provider]  rosuvastatin (CRESTOR) 10 MG tablet TAKE 1 TABLET (10 MG TOTAL) BY MOUTH DAILY. 06/22/20 06/22/21  Baglia, Corrina, PA-C  sevelamer carbonate (RENVELA) 800 MG tablet Take 4 tablets (3,200 mg total) by mouth 3 (three) times daily with meals. Patient taking differently: Take 4,000 mg by mouth See admin instructions. Take 4000 mg with each meal and snack 09/14/18   Monica Becton, MD  apixaban (ELIQUIS) 5 MG TABS tablet Take 1 tablet (5 mg total) by mouth 2 (two) times daily. 06/22/20 08/03/20  Karoline Caldwell, PA-C   Current Facility-Administered Medications  Medication Dose Route Frequency Provider Last Rate Last Admin  . [START ON 12/10/2020] Chlorhexidine Gluconate Cloth 2 % PADS 6 each  6 each Topical Q0600 Strother Everitt, Hervey Ard, PA-C      . [START ON 12/10/2020] Chlorhexidine Gluconate Cloth 2 % PADS 6 each  6 each Topical Q0600 Janalee Dane, PA-C       Current Outpatient Medications  Medication Sig Dispense Refill  . albuterol (VENTOLIN HFA) 108 (90 Base) MCG/ACT inhaler Inhale 2 puffs into the lungs every 4 (four) hours as needed for shortness of breath.     [name] amiodarone (PACERONE) 200 MG tablet TAKE TWO TABLETS ONCE A DAY FOR 7 DAYS. STARTING ON 06/30/20 TAKE ONLY ONE TABLET DAILY 30 tablet 2  . amLODipine (NORVASC) 2.5 MG tablet Take 2.5 mg by mouth at bedtime.     . bismuth subsalicylate (PEPTO BISMOL) 262 MG chewable tablet Chew 524 mg by mouth as needed for indigestion or diarrhea or loose stools.    . clopidogrel (PLAVIX) 75 MG tablet TAKE 1 TABLET (75 MG TOTAL) BY MOUTH DAILY. 30 tablet 3  . Methoxy  PEG-Epoetin Beta (MIRCERA IJ) Mircera    . oxyCODONE (OXY IR/ROXICODONE) 5 MG immediate release tablet Take 5 mg by mouth 3 (three) times daily as needed.    [name] oxyCODONE-acetaminophen (PERCOCET) 5-325 MG tablet Take 1 tablet by mouth every 6 (six) hours as needed for severe pain. 20 tablet 0  . pantoprazole (PROTONIX) 40 MG tablet Take 1 tablet (40 mg total) by mouth daily. 30 tablet 0  . QUEtiapine (SEROQUEL) 100 MG tablet Take 100 mg by mouth at bedtime as needed (sleep).     . rosuvastatin (CRESTOR) 10 MG tablet TAKE 1 TABLET (10 MG TOTAL) BY MOUTH DAILY. 30 tablet 11  . sevelamer carbonate (RENVELA) 800 MG tablet Take 4 tablets (3,200 mg total) by mouth 3 (three) times daily with meals. (Patient taking differently:  Take 4,000 mg by mouth See admin instructions. Take 4000 mg with each meal and snack) 120 tablet 0   Facility-Administered Medications Ordered in Other Encounters  Medication Dose Route Frequency Provider Last Rate Last Admin  . midazolam (VERSED) 5 MG/5ML injection    Anesthesia Intra-op Sammie Bench, CRNA   2 mg at 04/11/18 1042   Labs: Basic Metabolic Panel: Recent Labs  Lab 12/09/20 1212  NA 138  K 6.7*  CL 97*  CO2 19*  GLUCOSE 146*  BUN 135*  CREATININE 20.33*  CALCIUM 8.9   CBC: Recent Labs  Lab 12/09/20 1212  WBC 2.8*  NEUTROABS 2.2  HGB 11.0*  HCT 35.5*  MCV 105.0*  PLT 96*   CBG: Recent Labs  Lab 12/09/20 1051 12/09/20 1110 12/09/20 1140 12/09/20 1213 12/09/20 1310  GLUCAP 55* 39* 22* 127* 74   Studies/Results: DG Chest 2 View  Result Date: 12/09/2020 CLINICAL DATA:  Shortness of breath.  Hypoxia. EXAM: CHEST - 2 VIEW COMPARISON:  One-view chest x-ray 07/28/2020 FINDINGS: Heart is enlarged. Mild pulmonary vascular congestion noted. Significant effusion. No airspace disease is present. Axial skeleton is unremarkable. IMPRESSION: Cardiomegaly and mild pulmonary vascular congestion. Electronically Signed   By: San Morelle M.D.    On: 12/09/2020 11:06    Outpatient Dialysis Orders:  Center: [place]  on TTS. 180NRe, TTS, 4 hours, BFR 450, DFR Auto 1.5, EDW 69.5kg, 2K/2Ca, AVF15g No heparin Mircera 150 mcg IV q 2 weeks 131mcg on 5/21 Venofer 100mg  IV q HD- received 0 doses so far Calcitriol 2.25 mcg PO q HD Sensipar 180mg  PO q HD  Assessment/Plan: 1.  ESRD:  Missed HD x 8 days. BUN and creatinine severely elevated, will dialyze urgently today with lower time, BFR and DFR to prevent dialysis dysequilibrium. Will resume TTS schedule tomorrow. 2. Hyperkalemia: Due to missed HD. Given temporizing measures in the ED and lokelma, will use lower K+ bath for part of HD today.  3.  Hypertension/volume: BP significantly elevated, pulmonary edema on chest x-ray. O2 sat currently stable on room air. UF with HD today and tomorrow as tolerated.  4.  Anemia: Hgb 11.0. No ESA indicated at this time, will follow trend.  5.  Metabolic bone disease: Calcium controlled. Continue calcitriol and sensipar.  6.  Nutrition:  Will need renal diet with fluid restrictions  Anice Paganini, PA-C 12/09/2020, 2:26 PM  [facility] Pager: [phone]

## 2020-12-09 NOTE — ED Notes (Signed)
MD Dykstra notified of potassium 6.7

## 2020-12-09 NOTE — ED Notes (Signed)
Pt assisted to bathroom via wheelchair and back to bed; pt given turkery sandwich

## 2020-12-09 NOTE — H&P (Signed)
History and Physical    Raymond [name] IEP:[id number] DOB: 1956-05-18 DOA: 12/09/2020  PCP: [name], FNP (Confirm with patient/family/NH records and if not entered, this has to be entered at [facility] point of entry) Patient coming from: Home  I have personally briefly reviewed patient's old medical records in [place]  Chief Complaint: SOB, feeling tired.  HPI: Raymond [name] is a 65 y.o. male with medical history significant of ESRD on HD TTS, CAD, PAF off Eliquis secondary to repeated GI bleed, PVD, HLD, hep C status post interferon treatment 2014, noncompliant with medications, presented with increasing shortness of breath, fatigue.  Patient's sister passed away on 2022-10-14, so patient was not able to go to dialysis dialysis last Saturday and today. This morning, he woke up feeling shortness of breath, nauseous, and confused.  Denies any cough, no chest pain no fever chills.  Patient also claims that " a new cardiology discontinue all heart medications about two weeks ago" but patient cannot provide me with detailed information regarding the name of the doctor and the recent for discontinue those medications.  ED Course: Patient was noticed to have hyperglycemia, D50 given and glucose level recovered.  Chest x-ray showed cardiomegaly and pulmonary congestion.  Blood work showed uremia and hyperkalemia.  Nephrology informed, patient received insulin and glucose cocktail in the ED.  Review of Systems: As per HPI otherwise 14 point review of systems negative.    Past Medical History:  Diagnosis Date  . Anemia   . CAD (coronary artery disease)    a. 03/2018: cath showing 20 to 30% stenosis along the LAD, luminal irregularities along the RCA, and angiographically normal LCx  . COPD (chronic obstructive pulmonary disease) ([place])   . ESRD (end stage renal disease) on dialysis [facility])    "MWF; [facility] Rinks" (07/22/17)  . Hepatitis C    Still positive s/p liver biopsy at [facility]  and  interferon therapy for 6 months. Most recent lab work was on 10/24/12  . Hepatitis C    "took the tx; gone now" (12/05/2016)  Was treated  . History of blood transfusion ~ 2012/2013   "related to my kidneys; blood was low"  . Hypertension   . Peripheral arterial disease ([place])   . Substance abuse ([place])   . Thyroid disease     Past Surgical History:  Procedure Laterality Date  . ABDOMINAL AORTOGRAM W/LOWER EXTREMITY N/A 02/08/2018   Procedure: ABDOMINAL AORTOGRAM W/LOWER EXTREMITY;  Surgeon: Conrad Tecumseh, MD;  Location: [place] CV LAB;  Service: Cardiovascular;  Laterality: N/A;  . ABDOMINAL AORTOGRAM W/LOWER EXTREMITY Bilateral 06/15/2020   Procedure: ABDOMINAL AORTOGRAM W/LOWER EXTREMITY;  Surgeon: Waynetta Sandy, MD;  Location: [place] CV LAB;  Service: Cardiovascular;  Laterality: Bilateral;  . AV FISTULA PLACEMENT Left Aug. 2013 ?  [name] BIOPSY  01/17/2020   Procedure: BIOPSY;  Surgeon: Jackquline Denmark, MD;  Location: [place];  Service: Endoscopy;;  . COLONOSCOPY WITH PROPOFOL N/A 08/21/2018   Procedure: COLONOSCOPY WITH PROPOFOL;  Surgeon: Yetta Flock, MD;  Location: [place];  Service: Gastroenterology;  Laterality: N/A;  . ENTEROSCOPY N/A 01/17/2020   Procedure: ENTEROSCOPY;  Surgeon: Jackquline Denmark, MD;  Location: [facility] ENDOSCOPY;  Service: Endoscopy;  Laterality: N/A;  . ENTEROSCOPY N/A 07/31/2020   Procedure: ENTEROSCOPY;  Surgeon: Doran Stabler, MD;  Location: [facility] ENDOSCOPY;  Service: Gastroenterology;  Laterality: N/A;  . ESOPHAGOGASTRODUODENOSCOPY (EGD) WITH PROPOFOL N/A 08/20/2018   Procedure: ESOPHAGOGASTRODUODENOSCOPY (EGD) WITH PROPOFOL;  Surgeon: Gatha Mayer, MD;  Location[facility] ENDOSCOPY;  Service: Endoscopy;  Laterality: N/A;  . FEMORAL-POPLITEAL BYPASS GRAFT Left 04/11/2018   Procedure: BYPASS GRAFT FEMORAL-POPLITEAL ARTERY LEFT LEG;  Surgeon: Rosetta Posner, MD;  Location: [place];  Service: Vascular;  Laterality: Left;  . GIVENS CAPSULE STUDY N/A  07/31/2020   Procedure: GIVENS CAPSULE STUDY;  Surgeon: Doran Stabler, MD;  Location: [place];  Service: Gastroenterology;  Laterality: N/A;  . HOT HEMOSTASIS N/A 08/20/2018   Procedure: HOT HEMOSTASIS (ARGON PLASMA COAGULATION/BICAP);  Surgeon: Gatha Mayer, MD;  Location: [facility] ENDOSCOPY;  Service: Endoscopy;  Laterality: N/A;  . HOT HEMOSTASIS N/A 01/17/2020   Procedure: HOT HEMOSTASIS (ARGON PLASMA COAGULATION/BICAP);  Surgeon: Jackquline Denmark, MD;  Location: [facility] ENDOSCOPY;  Service: Endoscopy;  Laterality: N/A;  . LEFT HEART CATH AND CORONARY ANGIOGRAPHY N/A 03/29/2018   Procedure: LEFT HEART CATH AND CORONARY ANGIOGRAPHY;  Surgeon: Nelva Bush, MD;  Location: [place] CV LAB;  Service: Cardiovascular;  Laterality: N/A;  . LIVER BIOPSY    . ORIF ULNAR FRACTURE Left 05/18/2017   Procedure: OPEN REDUCTION INTERNAL FIXATION (ORIF) ULNAR FRACTURE;  Surgeon: Iran Planas, MD;  Location: [place];  Service: Orthopedics;  Laterality: Left;  . PERIPHERAL VASCULAR INTERVENTION Right 06/15/2020   Procedure: PERIPHERAL VASCULAR INTERVENTION;  Surgeon: Waynetta Sandy, MD;  Location: [place] CV LAB;  Service: Cardiovascular;  Laterality: Right;  . POLYPECTOMY  08/21/2018   Procedure: POLYPECTOMY;  Surgeon: Yetta Flock, MD;  Location: [facility] ENDOSCOPY;  Service: Gastroenterology;;  . SHUNTOGRAM N/A 12/11/2012   Procedure: Earney Mallet;  Surgeon: Serafina Mitchell, MD;  Location: [facility] CATH LAB;  Service: Cardiovascular;  Laterality: N/A;  . WOUND EXPLORATION Left 06/15/2020   Procedure: GROIN  EXPLORATION;  Surgeon: Waynetta Sandy, MD;  Location: [place];  Service: Vascular;  Laterality: Left;     reports that he has been smoking cigarettes. He has a 12.50 pack-year smoking history. He has never used smokeless tobacco. He reports current alcohol use. He reports previous drug use. Drug: Cocaine.  No Known Allergies  Family History  Problem Relation Age of Onset  . Diabetes  Father   . Hypertension Father   . Heart disease Father      Prior to Admission medications   Medication Sig Start Date End Date Taking? Authorizing Provider  albuterol (VENTOLIN HFA) 108 (90 Base) MCG/ACT inhaler Inhale 2 puffs into the lungs as needed for shortness of breath or wheezing. 11/06/19  Yes [provider]  sevelamer carbonate (RENVELA) 800 MG tablet Take 4 tablets (3,200 mg total) by mouth 3 (three) times daily with meals. Patient taking differently: Take 4,000 mg by mouth 3 (three) times daily with meals. And take 4000 mg with snacks. 09/14/18  Yes Vasireddy, Grier Mitts, MD  amiodarone (PACERONE) 200 MG tablet TAKE TWO TABLETS ONCE A DAY FOR 7 DAYS. STARTING ON 06/30/20 TAKE ONLY ONE TABLET DAILY Patient not taking: Reported on 12/09/2020 06/22/20 06/22/21  Karoline Caldwell, PA-C  clopidogrel (PLAVIX) 75 MG tablet TAKE 1 TABLET (75 MG TOTAL) BY MOUTH DAILY. Patient not taking: Reported on 12/09/2020 06/22/20 06/22/21  Karoline Caldwell, PA-C  oxyCODONE-acetaminophen (PERCOCET) 5-325 MG tablet Take 1 tablet by mouth every 6 (six) hours as needed for severe pain. Patient not taking: Reported on 12/09/2020 07/01/20 07/01/21  Gabriel Earing, PA-C  pantoprazole (PROTONIX) 40 MG tablet Take 1 tablet (40 mg total) by mouth daily. Patient not taking: Reported on 12/09/2020 01/18/20   Oren Binet  M, MD  rosuvastatin (CRESTOR) 10 MG tablet TAKE 1 TABLET (10 MG TOTAL) BY MOUTH DAILY. Patient not taking: Reported on 12/09/2020 06/22/20 06/22/21  Karoline Caldwell, PA-C  apixaban (ELIQUIS) 5 MG TABS tablet Take 1 tablet (5 mg total) by mouth 2 (two) times daily. 06/22/20 08/03/20  Karoline Caldwell, PA-C    Physical Exam: Vitals:   12/09/20 1227 12/09/20 1228 12/09/20 1230 12/09/20 1415  BP:  (!) 195/123 (!) 172/120 (!) 194/124  Pulse: 100  100 97  Resp: (!) 23  (!) 22 20  Temp:  97.9 F (36.6 C)    TempSrc:  Oral    SpO2: 96%  96% 96%    Constitutional: NAD, calm, comfortable Vitals:    12/09/20 1227 12/09/20 1228 12/09/20 1230 12/09/20 1415  BP:  (!) 195/123 (!) 172/120 (!) 194/124  Pulse: 100  100 97  Resp: (!) 23  (!) 22 20  Temp:  97.9 F (36.6 C)    TempSrc:  Oral    SpO2: 96%  96% 96%   Eyes: PERRL, lids and conjunctivae normal ENMT: Mucous membranes are moist. Posterior pharynx clear of any exudate or lesions.Normal dentition.  Neck: normal, supple, no masses, no thyromegaly Respiratory: clear to auscultation bilaterally, no wheezing, B/L crackles. Increasing respiratory effort. No accessory muscle use.  Cardiovascular: Regular rate and rhythm, no murmurs / rubs / gallops. No extremity edema. 2+ pedal pulses. No carotid bruits.  Abdomen: no tenderness, no masses palpated. No hepatosplenomegaly. Bowel sounds positive.  Musculoskeletal: no clubbing / cyanosis. No joint deformity upper and lower extremities. Good ROM, no contractures. Normal muscle tone.  Skin: no rashes, lesions, ulcers. No induration Neurologic: CN 2-12 grossly intact. Sensation intact, DTR normal. Strength 5/5 in all 4.  Psychiatric: Somewhat confused. Alert and oriented x 3. Normal mood.     Labs on Admission: I have personally reviewed following labs and imaging studies  CBC: Recent Labs  Lab 12/09/20 1212  WBC 2.8*  NEUTROABS 2.2  HGB 11.0*  HCT 35.5*  MCV 105.0*  PLT 96*   Basic Metabolic Panel: Recent Labs  Lab 12/09/20 1212  NA 138  K 6.7*  CL 97*  CO2 19*  GLUCOSE 146*  BUN 135*  CREATININE 20.33*  CALCIUM 8.9   GFR: CrCl cannot be calculated (Unknown ideal weight.). Liver Function Tests: No results for input(s): AST, ALT, ALKPHOS, BILITOT, PROT, ALBUMIN in the last 168 hours. No results for input(s): LIPASE, AMYLASE in the last 168 hours. No results for input(s): AMMONIA in the last 168 hours. Coagulation Profile: No results for input(s): INR, PROTIME in the last 168 hours. Cardiac Enzymes: No results for input(s): CKTOTAL, CKMB, CKMBINDEX, TROPONINI in the  last 168 hours. BNP (last 3 results) No results for input(s): PROBNP in the last 8760 hours. HbA1C: No results for input(s): HGBA1C in the last 72 hours. CBG: Recent Labs  Lab 12/09/20 1051 12/09/20 1110 12/09/20 1140 12/09/20 1213 12/09/20 1310  GLUCAP 55* 39* 22* 127* 74   Lipid Profile: No results for input(s): CHOL, HDL, LDLCALC, TRIG, CHOLHDL, LDLDIRECT in the last 72 hours. Thyroid Function Tests: No results for input(s): TSH, T4TOTAL, FREET4, T3FREE, THYROIDAB in the last 72 hours. Anemia Panel: No results for input(s): VITAMINB12, FOLATE, FERRITIN, TIBC, IRON, RETICCTPCT in the last 72 hours. Urine analysis:    Component Value Date/Time   COLORURINE YELLOW 05/08/2010 [place] 05/08/2010 1335   LABSPEC 1.009 05/08/2010 1335   PHURINE 6.0 05/08/2010 1335   GLUCOSEU NEGATIVE  05/08/2010 [place] 05/08/2010 [place] 05/08/2010 1335   KETONESUR NEGATIVE 05/08/2010 1335   PROTEINUR 100 (A) 05/08/2010 1335   UROBILINOGEN 0.2 05/08/2010 1335   NITRITE NEGATIVE 05/08/2010 1335   LEUKOCYTESUR SMALL (A) 05/08/2010 1335    Radiological Exams on Admission: DG Chest 2 View  Result Date: 12/09/2020 CLINICAL DATA:  Shortness of breath.  Hypoxia. EXAM: CHEST - 2 VIEW COMPARISON:  One-view chest x-ray 07/28/2020 FINDINGS: Heart is enlarged. Mild pulmonary vascular congestion noted. Significant effusion. No airspace disease is present. Axial skeleton is unremarkable. IMPRESSION: Cardiomegaly and mild pulmonary vascular congestion. Electronically Signed   By: San Morelle M.D.   On: 12/09/2020 11:06    EKG: Independently reviewed. Sinus, no significant ST changes  Assessment/Plan Active Problems:   Hyperkalemia   Acute metabolic encephalopathy  (please populate well all problems here in Problem List. (For example, if patient is on BP meds at home and you resume or decide to hold them, it is a problem that needs to be her.  Same for CAD, COPD, HLD and so on)  Acute metabolic encephalopathy -Secondary to acute on chronic uremia from missed dialysis.  Emergent dialysis and then reevaluate.  Hyperkalemia -Secondary to missing dialysis -Received cocktail of insulin and dextrose, and Lokelma, emergency dialysis to follow.  Hypoglycemia -Acute on chronic, A1c was 4.6 -Check TSH and cortisol level.  Chronic non-anion gap metabolic acidosis -Secondary to ESRD, start bicarb p.o.  PAF -Restart amiodarone, suspect patient noncompliant with current medications. -Off Eliquis due to severe AVM bleeding  PVD and CAD -Restart Plavix, and statin  Pancytopenia -Stable  DVT prophylaxis: Heparin subcu  code Status: Full Code Family Communication: None at bedside Disposition Plan: Expect hemodialysis today and tomorrow, expect more than 2 midnight hospital stay Consults called: Nephrology Admission status: Telemetry admission   Lequita Halt MD [facility] Pager [phone]  12/09/2020, 2:57 PM

## 2020-12-09 NOTE — ED Notes (Signed)
Report given to [place].

## 2020-12-09 NOTE — ED Triage Notes (Signed)
Pt in with c/o weakness, nausea, and tingling in left hand that started yesterday but has worsened. Pt states he is also having blurred vision  Denies any CP, slurred speech, gait issues

## 2020-12-09 NOTE — ED Notes (Signed)
EMS en route for pt. Called report to ED Charge RN.

## 2020-12-09 NOTE — ED Notes (Signed)
Leaves facility with EMS at this time

## 2020-12-09 NOTE — ED Notes (Signed)
Patient left to dialysis.

## 2020-12-09 NOTE — ED Notes (Signed)
Report given to Dialysis RN.

## 2020-12-09 NOTE — ED Triage Notes (Signed)
BIB [facility] after staff at Urgent Care called to report increase SHOB particularly  with exertion and low blood sugar. Per EMS, pulse ox at urgent care was 88% pt placed on [place].  [place] removed en route to hospital, pt sat. 95% upon arrival with CBG 188. Pt has missed two dialysis appts in the last week.

## 2020-12-09 NOTE — ED Notes (Signed)
Lab to add trop  

## 2020-12-09 NOTE — ED Provider Notes (Signed)
[place] EMERGENCY DEPARTMENT Provider Note   CSN: [id number] Arrival date & time: 12/09/20  1202     History Chief Complaint  Patient presents with  . Shortness of Breath    Raymond [name] is a 65 y.o. male.  Presents to ER with SOB, missed dialysis.  Patient reports last dialysis was on this past Saturday.  Missed dialysis due to family member passing.  Over the last couple days has had steadily progressive shortness of breath.  Today more severe.  Also had some discomfort in his chest prior to coming to ER.  No longer having chest pain.  Originally went to urgent care.  There he was noted to be mildly hypoxic and hypoglycemic.  Provided D50 and transferred to ER for further eval.  Patient states he feels somewhat lethargic at present, still short of breath and denies other complaints.  HPI     Past Medical History:  Diagnosis Date  . Anemia   . CAD (coronary artery disease)    a. 03/2018: cath showing 20 to 30% stenosis along the LAD, luminal irregularities along the RCA, and angiographically normal LCx  . COPD (chronic obstructive pulmonary disease) ([place])   . ESRD (end stage renal disease) on dialysis [facility])    "MWF; Jeneen Rinks" (07/22/17)  . Hepatitis C    Still positive s/p liver biopsy at [facility]  and interferon therapy for 6 months. Most recent lab work was on 10/24/12  . Hepatitis C    "took the tx; gone now" (12/05/2016)  Was treated  . History of blood transfusion ~ 2012/2013   "related to my kidneys; blood was low"  . Hypertension   . Peripheral arterial disease ([place])   . Substance abuse ([place])   . Thyroid disease     Patient Active Problem List   Diagnosis Date Noted  . Acute upper GI bleed 07/29/2020  . Acute metabolic encephalopathy 15/40/0867  . COVID-19 virus infection 07/29/2020  . Gastric AVM   . AVM (arteriovenous malformation) of small bowel, acquired with hemorrhage   . Current use of long term anticoagulation   . History of  arteriovenous malformation (AVM) 01/14/2020  . UGIB (upper gastrointestinal bleed) 01/14/2020  . Acute blood loss anemia 01/14/2020  . Elevated troponin 01/14/2020  . Gastroenteritis 12/24/2018  . Substance use disorder 12/24/2018  . Noncompliance of patient with renal dialysis ([place]) 09/12/2018  . Benign neoplasm of colon   . Melena   . Anemia due to chronic kidney disease, on chronic dialysis ([place])   . Acute on chronic anemia   . H/O medication noncompliance   . Polysubstance (excluding opioids) dependence ([place])   . Atrial flutter with rapid ventricular response ([place]) 08/10/2018  . PAF (paroxysmal atrial fibrillation) ([place]) 08/01/2018  . Acute respiratory failure with hypoxia ([place]) 07/13/2018  . Non-compliance with renal dialysis ([place]) 07/13/2018  . PAD (peripheral artery disease) ([place]) 04/11/2018  . Sepsis ([place]) 03/31/2018  . Preop cardiovascular exam 03/29/2018  . Volume overload 08/07/2017  . Acute pulmonary edema (H[facility])   . Dyspnea 06/20/2017  . Acute bronchitis   . COPD (chronic obstructive pulmonary disease) ([place])   . End-stage renal disease on hemodialysis ([place])   . Hypoxia 12/05/2016  . Hyperkalemia 12/19/2015  . Hypoglycemia 12/19/2015  . Polysubstance abuse ([place]) 12/19/2015  . Homeless 12/19/2015  . Anemia associated with stage 5 chronic renal failure ([place]) 12/19/2015  . Atypical chest pain 12/19/2013  . Atherosclerosis of native arteries of extremity with intermittent claudication (  [place]) 12/04/2012  . ESRD (end stage renal disease) ([place]) 12/04/2012  . HEPATITIS C 09/07/2006  . HYPERCHOLESTEROLEMIA 09/07/2006  . HYPERTENSION, BENIGN SYSTEMIC 09/07/2006    Past Surgical History:  Procedure Laterality Date  . ABDOMINAL AORTOGRAM W/LOWER EXTREMITY N/A 02/08/2018   Procedure: ABDOMINAL AORTOGRAM W/LOWER EXTREMITY;  Surgeon: Conrad Sandersville, MD;  Location: [place] CV LAB;  Service: Cardiovascular;  Laterality: N/A;  . ABDOMINAL AORTOGRAM W/LOWER EXTREMITY Bilateral  06/15/2020   Procedure: ABDOMINAL AORTOGRAM W/LOWER EXTREMITY;  Surgeon: Waynetta Sandy, MD;  Location: [place] [facility];  Service: Cardiovascular;  Laterality: Bilateral;  . AV FISTULA PLACEMENT Left Aug. 2013 ?  [name] BIOPSY  01/17/2020   Procedure: BIOPSY;  Surgeon: Jackquline Denmark, MD;  Location: [place];  Service: Endoscopy;;  . COLONOSCOPY WITH PROPOFOL N/A 08/21/2018   Procedure: COLONOSCOPY WITH PROPOFOL;  Surgeon: Yetta Flock, MD;  Location: [place];  Service: Gastroenterology;  Laterality: N/A;  . ENTEROSCOPY N/A 01/17/2020   Procedure: ENTEROSCOPY;  Surgeon: Jackquline Denmark, MD;  Location: [facility] ENDOSCOPY;  Service: Endoscopy;  Laterality: N/A;  . ENTEROSCOPY N/A 07/31/2020   Procedure: ENTEROSCOPY;  Surgeon: Doran Stabler, MD;  Location: [facility] ENDOSCOPY;  Service: Gastroenterology;  Laterality: N/A;  . ESOPHAGOGASTRODUODENOSCOPY (EGD) WITH PROPOFOL N/A 08/20/2018   Procedure: ESOPHAGOGASTRODUODENOSCOPY (EGD) WITH PROPOFOL;  Surgeon: Gatha Mayer, MD;  Location: [place];  Service: Endoscopy;  Laterality: N/A;  . FEMORAL-POPLITEAL BYPASS GRAFT Left 04/11/2018   Procedure: BYPASS GRAFT FEMORAL-POPLITEAL ARTERY LEFT LEG;  Surgeon: Rosetta Posner, MD;  Location: [place];  Service: Vascular;  Laterality: Left;  . GIVENS CAPSULE STUDY N/A 07/31/2020   Procedure: GIVENS CAPSULE STUDY;  Surgeon: Doran Stabler, MD;  Location: [place];  Service: Gastroenterology;  Laterality: N/A;  . HOT HEMOSTASIS N/A 08/20/2018   Procedure: HOT HEMOSTASIS (ARGON PLASMA COAGULATION/BICAP);  Surgeon: Gatha Mayer, MD;  Location: [facility] ENDOSCOPY;  Service: Endoscopy;  Laterality: N/A;  . HOT HEMOSTASIS N/A 01/17/2020   Procedure: HOT HEMOSTASIS (ARGON PLASMA COAGULATION/BICAP);  Surgeon: Jackquline Denmark, MD;  Location: [facility] ENDOSCOPY;  Service: Endoscopy;  Laterality: N/A;  . LEFT HEART CATH AND CORONARY ANGIOGRAPHY N/A 03/29/2018   Procedure: LEFT HEART CATH AND CORONARY ANGIOGRAPHY;   Surgeon: Nelva Bush, MD;  Location: [place] CV LAB;  Service: Cardiovascular;  Laterality: N/A;  . LIVER BIOPSY    . ORIF ULNAR FRACTURE Left 05/18/2017   Procedure: OPEN REDUCTION INTERNAL FIXATION (ORIF) ULNAR FRACTURE;  Surgeon: Iran Planas, MD;  Location: [place];  Service: Orthopedics;  Laterality: Left;  . PERIPHERAL VASCULAR INTERVENTION Right 06/15/2020   Procedure: PERIPHERAL VASCULAR INTERVENTION;  Surgeon: Waynetta Sandy, MD;  Location: [place] CV LAB;  Service: Cardiovascular;  Laterality: Right;  . POLYPECTOMY  08/21/2018   Procedure: POLYPECTOMY;  Surgeon: Yetta Flock, MD;  Location: [facility] ENDOSCOPY;  Service: Gastroenterology;;  . SHUNTOGRAM N/A 12/11/2012   Procedure: Earney Mallet;  Surgeon: Serafina Mitchell, MD;  Location: [facility] CATH LAB;  Service: Cardiovascular;  Laterality: N/A;  . WOUND EXPLORATION Left 06/15/2020   Procedure: GROIN  EXPLORATION;  Surgeon: Waynetta Sandy, MD;  Location: [facility] OR;  Service: Vascular;  Laterality: Left;       Family History  Problem Relation Age of Onset  . Diabetes Father   . Hypertension Father   . Heart disease Father     Social History   Tobacco Use  . Smoking status: Current Every Day Smoker    Packs/day: 0.50  Years: 25.00    Pack years: 12.50    Types: Cigarettes    Last attempt to quit: 08/01/2011    Years since quitting: 9.3  . Smokeless tobacco: Never Used  . Tobacco comment: he smokes 6 cigarettes a week  Vaping Use  . Vaping Use: Never used  Substance Use Topics  . Alcohol use: Yes    Comment: occasionally/ one half pint  . Drug use: Not Currently    Types: Cocaine    Home Medications Prior to Admission medications   Medication Sig Start Date End Date Taking? Authorizing Provider  albuterol (VENTOLIN HFA) 108 (90 Base) MCG/ACT inhaler Inhale 2 puffs into the lungs every 4 (four) hours as needed for shortness of breath.  11/06/19   [provider]  amiodarone (PACERONE)  200 MG tablet TAKE TWO TABLETS ONCE A DAY FOR 7 DAYS. STARTING ON 06/30/20 TAKE ONLY ONE TABLET DAILY 06/22/20 06/22/21  Baglia, Corrina, PA-C  amLODipine (NORVASC) 2.5 MG tablet Take 2.5 mg by mouth at bedtime.  01/01/20   [provider]  bismuth subsalicylate (PEPTO BISMOL) 262 MG chewable tablet Chew 524 mg by mouth as needed for indigestion or diarrhea or loose stools.    [provider]  clopidogrel (PLAVIX) 75 MG tablet TAKE 1 TABLET (75 MG TOTAL) BY MOUTH DAILY. 06/22/20 06/22/21  Baglia, Corrina, PA-C  Methoxy PEG-Epoetin Beta (MIRCERA IJ) Mircera 07/09/20 07/08/21  [provider]  oxyCODONE (OXY IR/ROXICODONE) 5 MG immediate release tablet Take 5 mg by mouth 3 (three) times daily as needed. 07/01/20   [provider]  oxyCODONE-acetaminophen (PERCOCET) 5-325 MG tablet Take 1 tablet by mouth every 6 (six) hours as needed for severe pain. 07/01/20 07/01/21  Gabriel Earing, PA-C  pantoprazole (PROTONIX) 40 MG tablet Take 1 tablet (40 mg total) by mouth daily. 01/18/20   Ghimire, Henreitta Leber, MD  QUEtiapine (SEROQUEL) 100 MG tablet Take 100 mg by mouth at bedtime as needed (sleep).  01/01/20   [provider]  rosuvastatin (CRESTOR) 10 MG tablet TAKE 1 TABLET (10 MG TOTAL) BY MOUTH DAILY. 06/22/20 06/22/21  Baglia, Corrina, PA-C  sevelamer carbonate (RENVELA) 800 MG tablet Take 4 tablets (3,200 mg total) by mouth 3 (three) times daily with meals. Patient taking differently: Take 4,000 mg by mouth See admin instructions. Take 4000 mg with each meal and snack 09/14/18   Monica Becton, MD  apixaban (ELIQUIS) 5 MG TABS tablet Take 1 tablet (5 mg total) by mouth 2 (two) times daily. 06/22/20 08/03/20  Karoline Caldwell, PA-C    Allergies    Patient has no known allergies.  Review of Systems   Review of Systems  Constitutional: Positive for fatigue. Negative for chills and fever.  HENT: Negative for ear pain and sore throat.   Eyes: Negative for pain  and visual disturbance.  Respiratory: Positive for shortness of breath. Negative for cough.   Cardiovascular: Negative for chest pain and palpitations.  Gastrointestinal: Negative for abdominal pain and vomiting.  Genitourinary: Negative for dysuria and hematuria.  Musculoskeletal: Negative for arthralgias and back pain.  Skin: Negative for color change and rash.  Neurological: Negative for seizures and syncope.  All other systems reviewed and are negative.   Physical Exam Updated Vital Signs BP (!) 172/120   Pulse 100   Temp 97.9 F (36.6 C) (Oral)   Resp (!) 22   SpO2 96%   Physical Exam Vitals and nursing note reviewed.  Constitutional:      Appearance: He  is well-developed.  HENT:     Head: Normocephalic and atraumatic.  Eyes:     Conjunctiva/sclera: Conjunctivae normal.  Cardiovascular:     Rate and Rhythm: Normal rate and regular rhythm.     Heart sounds: No murmur heard.   Pulmonary:     Effort: Pulmonary effort is normal.     Breath sounds: Normal breath sounds.     Comments: Slightly diminished at bases Abdominal:     Palpations: Abdomen is soft.     Tenderness: There is no abdominal tenderness.  Musculoskeletal:     Cervical back: Neck supple.  Skin:    General: Skin is warm and dry.  Neurological:     Mental Status: He is alert.     Comments: Mildly lethargic but easily aroused     ED Results / Procedures / Treatments   Labs (all labs ordered are listed, but only abnormal results are displayed) Labs Reviewed  CBC WITH DIFFERENTIAL/PLATELET - Abnormal; Notable for the following components:      Result Value   WBC 2.8 (*)    RBC 3.38 (*)    Hemoglobin 11.0 (*)    HCT 35.5 (*)    MCV 105.0 (*)    RDW 20.3 (*)    Platelets 96 (*)    Lymphs Abs 0.3 (*)    All other components within normal limits  BASIC METABOLIC PANEL - Abnormal; Notable for the following components:   Potassium 6.7 (*)    Chloride 97 (*)    CO2 19 (*)    Glucose, Bld 146  (*)    BUN 135 (*)    Creatinine, Ser 20.33 (*)    GFR, Estimated 2 (*)    Anion gap >20 (*)    All other components within normal limits  CBG MONITORING, ED - Abnormal; Notable for the following components:   Glucose-Capillary 127 (*)    All other components within normal limits  RESP PANEL BY RT-PCR (FLU A&B, COVID) ARPGX2  CBG MONITORING, ED  TROPONIN I (HIGH SENSITIVITY)    EKG EKG Interpretation  Date/Time:  Wednesday December 09 2020 12:08:49 EDT Ventricular Rate:  106 PR Interval:  190 QRS Duration: 95 QT Interval:  369 QTC Calculation: 472 R Axis:   -37 Text Interpretation: Sinus tachycardia Multiform ventricular premature complexes Left axis deviation Repol abnrm, severe global ischemia (LM/MVD) no acute change from prior same day Confirmed by Madalyn Rob [phone]) on 12/09/2020 12:28:21 PM   Radiology DG Chest 2 View  Result Date: 12/09/2020 CLINICAL DATA:  Shortness of breath.  Hypoxia. EXAM: CHEST - 2 VIEW COMPARISON:  One-view chest x-ray 07/28/2020 FINDINGS: Heart is enlarged. Mild pulmonary vascular congestion noted. Significant effusion. No airspace disease is present. Axial skeleton is unremarkable. IMPRESSION: Cardiomegaly and mild pulmonary vascular congestion. Electronically Signed   By: San Morelle M.D.   On: 12/09/2020 11:06    Procedures .Critical Care Performed by: Lucrezia Starch, MD Authorized by: Lucrezia Starch, MD   Critical care provider statement:    Critical care time (minutes):  43   Critical care was necessary to treat or prevent imminent or life-threatening deterioration of the following conditions:  Metabolic crisis   Critical care was time spent personally by me on the following activities:  Discussions with consultants, evaluation of patient's response to treatment, examination of patient, ordering and performing treatments and interventions, ordering and review of laboratory studies, ordering and review of radiographic  studies, pulse oximetry, re-evaluation of patient's condition, obtaining  history from patient or surrogate and review of old charts     Medications Ordered in ED Medications  albuterol (VENTOLIN HFA) 108 (90 Base) MCG/ACT inhaler 4 puff (4 puffs Inhalation Given 12/09/20 1408)  insulin aspart (novoLOG) injection 5 Units (5 Units Intravenous Given 12/09/20 1408)    And  dextrose 50 % solution 50 mL (50 mLs Intravenous Given 12/09/20 1408)  sodium zirconium cyclosilicate (LOKELMA) packet 10 g (10 g Oral Given 12/09/20 1408)    ED Course  I have reviewed the triage vital signs and the nursing notes.  Pertinent labs & imaging results that were available during my care of the patient were reviewed by me and considered in my medical decision making (see chart for details).  Clinical Course as of 12/09/20 1411  Wed Dec 09, 2020  1358 D/w nephrology - rec lokelma, insulin/d50 and he'll come see for urgent dialysis [RD]    Clinical Course User Index [RD] Lucrezia Starch, MD   MDM Rules/Calculators/A&P                         65 year old male presents to ER with concern for shortness of breath, missed dialysis.  On exam he was noted to be slightly lethargic but easily arousable.  Mild tachypnea but not in distress.  EKG with more prominent T wave.  Chemistry concerning for hyperkalemia.  Glucose have improved.  CXR with some mild edema.  Suspect this all stems from missed dialysis.  Discussed with Posey Pronto with neurology.  He will come evaluate patient, initiate dialysis.  Recommended Lokelma, insulin/D50 for now.  Given his reported difficulty in breathing, hypoglycemia episode, believe patient would benefit from admission for further observation in addition to receivingdialysis.   Final Clinical Impression(s) / ED Diagnoses Final diagnoses:  Hyperkalemia  ESRD (end stage renal disease) ([place])  Hypoglycemia    Rx / DC Orders ED Discharge Orders    None       Lucrezia Starch, MD 12/09/20  [phone]

## 2020-12-09 NOTE — ED Notes (Signed)
EMS at bedside

## 2020-12-09 NOTE — ED Notes (Signed)
Dialysis called to transport patient to inpatient bed.

## 2020-12-10 ENCOUNTER — Encounter (HOSPITAL_COMMUNITY): Payer: Self-pay | Admitting: Internal Medicine

## 2020-12-10 DIAGNOSIS — E872 Acidosis: Secondary | ICD-10-CM

## 2020-12-10 DIAGNOSIS — T383X5A Adverse effect of insulin and oral hypoglycemic [antidiabetic] drugs, initial encounter: Secondary | ICD-10-CM

## 2020-12-10 DIAGNOSIS — R778 Other specified abnormalities of plasma proteins: Secondary | ICD-10-CM

## 2020-12-10 DIAGNOSIS — N19 Unspecified kidney failure: Secondary | ICD-10-CM

## 2020-12-10 DIAGNOSIS — E160 Drug-induced hypoglycemia without coma: Secondary | ICD-10-CM

## 2020-12-10 DIAGNOSIS — G9341 Metabolic encephalopathy: Secondary | ICD-10-CM

## 2020-12-10 DIAGNOSIS — F17200 Nicotine dependence, unspecified, uncomplicated: Secondary | ICD-10-CM

## 2020-12-10 DIAGNOSIS — F1010 Alcohol abuse, uncomplicated: Secondary | ICD-10-CM

## 2020-12-10 DIAGNOSIS — D61818 Other pancytopenia: Secondary | ICD-10-CM

## 2020-12-10 LAB — GLUCOSE, CAPILLARY
Glucose-Capillary: 113 mg/dL — ABNORMAL HIGH (ref 70–99)
Glucose-Capillary: 117 mg/dL — ABNORMAL HIGH (ref 70–99)
Glucose-Capillary: 123 mg/dL — ABNORMAL HIGH (ref 70–99)

## 2020-12-10 LAB — BASIC METABOLIC PANEL
Anion gap: 15 (ref 5–15)
BUN: 57 mg/dL — ABNORMAL HIGH (ref 8–23)
CO2: 29 mmol/L (ref 22–32)
Calcium: 9 mg/dL (ref 8.9–10.3)
Chloride: 93 mmol/L — ABNORMAL LOW (ref 98–111)
Creatinine, Ser: 11.44 mg/dL — ABNORMAL HIGH (ref 0.61–1.24)
GFR, Estimated: 4 mL/min — ABNORMAL LOW (ref >60)
Glucose, Bld: 105 mg/dL — ABNORMAL HIGH (ref 70–99)
Potassium: 3.7 mmol/L (ref 3.5–5.1)
Sodium: 137 mmol/L (ref 135–145)

## 2020-12-10 LAB — CBC
HCT: 31.2 % — ABNORMAL LOW (ref 39.0–52.0)
Hemoglobin: 10.1 g/dL — ABNORMAL LOW (ref 13.0–17.0)
MCH: 32.8 pg (ref 26.0–34.0)
MCHC: 32.4 g/dL (ref 30.0–36.0)
MCV: 101.3 fL — ABNORMAL HIGH (ref 80.0–100.0)
Platelets: 93 10*3/uL — ABNORMAL LOW (ref 150–400)
RBC: 3.08 MIL/uL — ABNORMAL LOW (ref 4.22–5.81)
RDW: 19.9 % — ABNORMAL HIGH (ref 11.5–15.5)
WBC: 2 10*3/uL — ABNORMAL LOW (ref 4.0–10.5)
nRBC: 0 % (ref 0.0–0.2)

## 2020-12-10 MED ORDER — QUETIAPINE FUMARATE 100 MG PO TABS: 100.0000 mg | ORAL_TABLET | Freq: Once | ORAL | Status: DC

## 2020-12-10 MED ORDER — FOLIC ACID 1 MG PO TABS
1.0000 mg | ORAL_TABLET | Freq: Every day | ORAL | Status: DC
  Administered 2020-12-10 – 2020-12-11 (×2): 1 mg via ORAL
  Filled 2020-12-10 (×2): qty 1

## 2020-12-10 MED ORDER — CINACALCET HCL 30 MG PO TABS
ORAL_TABLET | ORAL | Status: AC
  Administered 2020-12-10: 180 mg via ORAL
  Filled 2020-12-10: qty 6

## 2020-12-10 MED ORDER — NICOTINE 14 MG/24HR TD PT24
14.0000 mg | MEDICATED_PATCH | Freq: Every day | TRANSDERMAL | Status: DC
  Administered 2020-12-10 – 2020-12-11 (×2): 14 mg via TRANSDERMAL
  Filled 2020-12-10 (×2): qty 1

## 2020-12-10 MED ORDER — THIAMINE HCL 100 MG PO TABS
100.0000 mg | ORAL_TABLET | Freq: Every day | ORAL | Status: DC
  Administered 2020-12-10 – 2020-12-11 (×2): 100 mg via ORAL
  Filled 2020-12-10 (×2): qty 1

## 2020-12-10 MED ORDER — LABETALOL HCL 5 MG/ML IV SOLN
10.0000 mg | INTRAVENOUS | Status: DC | PRN
  Administered 2020-12-10 – 2020-12-11 (×3): 10 mg via INTRAVENOUS
  Filled 2020-12-10 (×3): qty 4

## 2020-12-10 MED ORDER — CALCITRIOL 0.25 MCG PO CAPS
ORAL_CAPSULE | ORAL | Status: AC
  Filled 2020-12-10: qty 1

## 2020-12-10 MED ORDER — LEVALBUTEROL HCL 0.63 MG/3ML IN NEBU: 0.6300 mg | INHALATION_SOLUTION | Freq: Four times a day (QID) | RESPIRATORY_TRACT | Status: DC | PRN

## 2020-12-10 MED ORDER — LORAZEPAM 1 MG PO TABS: 1.0000 mg | ORAL_TABLET | ORAL | Status: DC | PRN

## 2020-12-10 MED ORDER — THIAMINE HCL 100 MG/ML IJ SOLN: 100.0000 mg | Freq: Every day | INTRAMUSCULAR | Status: DC

## 2020-12-10 MED ORDER — LORAZEPAM 2 MG/ML IJ SOLN
1.0000 mg | INTRAMUSCULAR | Status: DC | PRN
  Administered 2020-12-10: 1 mg via INTRAVENOUS
  Filled 2020-12-10: qty 1

## 2020-12-10 MED ORDER — ADULT MULTIVITAMIN W/MINERALS CH
1.0000 | ORAL_TABLET | Freq: Every day | ORAL | Status: DC
  Administered 2020-12-10 – 2020-12-11 (×2): 1 via ORAL
  Filled 2020-12-10 (×2): qty 1

## 2020-12-10 MED ORDER — CALCITRIOL 0.5 MCG PO CAPS
ORAL_CAPSULE | ORAL | Status: AC
  Filled 2020-12-10: qty 4

## 2020-12-10 MED ORDER — PROCHLORPERAZINE EDISYLATE 10 MG/2ML IJ SOLN
10.0000 mg | Freq: Four times a day (QID) | INTRAMUSCULAR | Status: DC | PRN
  Administered 2020-12-10: 10 mg via INTRAVENOUS
  Filled 2020-12-10: qty 2

## 2020-12-10 NOTE — Progress Notes (Signed)
[facility] Progress Note   Subjective:   Pt seen in room, tolerated HD last night and hyperkalemia resolved, labs improved. Reports he is breathing better with no chest pain, palpitations, or dizziness. No abdominal pain, nausea or vomiting.   Objective Vitals:   12/09/20 2230 12/09/20 2300 12/10/20 0043 12/10/20 0437  BP: 136/90 (!) 155/107 (!) 178/114 (!) 149/102  Pulse: (!) 113 (!) 105 (!) 115 (!) 102  Resp: 17 16 18 18   Temp:  97.9 F (36.6 C) 98.5 F (36.9 C) 98.5 F (36.9 C)  TempSrc:   Oral Oral  SpO2: 98% 98% 97% 92%  Weight:  71.8 kg     Physical Exam General: Well developed, well nourished, in no acute distress. Neck: JVD not elevated. Lungs: Breathing is unlabored on RA. Lungs CTA bilaterally Heart: RRR with normal S1, S2. No murmurs, rubs, or gallops appreciated. Abdomen: Soft, non-tender, non-distended with normoactive bowel sounds. No rebound/guarding. No obvious abdominal masses. Lower extremities: No edema bilateral lower extremities Neuro: Alert and oriented X 3. Moves all extremities spontaneously. No asterixis Psych:  Responds to questions appropriately with a normal affect. Dialysis Access: LUE AVF + thrill and bruit, multiple large aneurysms noted  Additional Objective Labs: Basic Metabolic Panel: Recent Labs  Lab 12/09/20 1212 12/10/20 0337  NA 138 137  K 6.7* 3.7  CL 97* 93*  CO2 19* 29  GLUCOSE 146* 105*  BUN 135* 57*  CREATININE 20.33* 11.44*  CALCIUM 8.9 9.0   CBC: Recent Labs  Lab 12/09/20 1212 12/10/20 0337  WBC 2.8* 2.0*  NEUTROABS 2.2  --   HGB 11.0* 10.1*  HCT 35.5* 31.2*  MCV 105.0* 101.3*  PLT 96* 93*   Blood Culture    Component Value Date/Time   SDES BLOOD RIGHT HAND 06/21/2020 0239   SPECREQUEST  06/21/2020 0239    BOTTLES DRAWN AEROBIC AND ANAEROBIC Blood Culture adequate volume   CULT  06/21/2020 0239    NO GROWTH 5 DAYS Performed at [facility], [place] [address], [place], [place]  [address]    REPTSTATUS 06/26/2020 FINAL 06/21/2020 0239   CBG: Recent Labs  Lab 12/09/20 1140 12/09/20 1213 12/09/20 1310 12/09/20 1741 12/10/20 0854  GLUCAP 22* 127* 74 104* 113*   Studies/Results: DG Chest 2 View  Result Date: 12/09/2020 CLINICAL DATA:  Shortness of breath.  Hypoxia. EXAM: CHEST - 2 VIEW COMPARISON:  One-view chest x-ray 07/28/2020 FINDINGS: Heart is enlarged. Mild pulmonary vascular congestion noted. Significant effusion. No airspace disease is present. Axial skeleton is unremarkable. IMPRESSION: Cardiomegaly and mild pulmonary vascular congestion. Electronically Signed   By: San Morelle M.D.   On: 12/09/2020 11:06   Medications:  . amiodarone  200 mg Oral Daily  . calcitRIOL  2.25 mcg Oral Q T,Th,Sa-HD  . Chlorhexidine Gluconate Cloth  6 each Topical Q0600  . cinacalcet  180 mg Oral Q T,Th,Sa-HD  . clopidogrel  75 mg Oral Daily  . heparin  5,000 Units Subcutaneous Q8H  . rosuvastatin  10 mg Oral Daily  . sevelamer carbonate  3,200 mg Oral TID WC  . sevelamer carbonate  4,000 mg Oral With snacks  . sodium bicarbonate  650 mg Oral BID    Outpatient Dialysis Orders:  Center: [facility]  on TTS. 180NRe, TTS, 4 hours, BFR 450, DFR Auto 1.5, EDW 69.5kg, 2K/2Ca, AVF15g No heparin Mircera 150 mcg IV q 2 weeks 153mcg on 5/21 Venofer 100mg  IV q HD- received 0 doses so far Calcitriol 2.25 mcg PO  q HD Sensipar 180mg  PO q HD  Assessment/Plan: 1. ESRD:  Missed HD x 8 days. BUN and creatinine severely elevated on admission, improved after dialysis last night. Resume TTS schedule with HD this afternoon. CO2 29 after dialysis, will stop PO bicarb.  2. Hyperkalemia: Due to missed HD. Given temporizing measures in the ED and lokelma, used lower K+ bath with HD and now resolved.  3.  Hypertension/volume: BP significantly elevated, pulmonary edema on chest x-ray. O2 sat currently stable on room air. Tolerated 3.5L UF last night, further UF with HD today.  4.  Anemia: Hgb  10.1. No ESA indicated at this time, will follow trend.  5.  Metabolic bone disease: Calcium controlled. Continue calcitriol and sensipar. Continue renvela, follow phos level.  6.  Nutrition:  Will need renal diet with fluid restrictions  Anice Paganini, PA-C 12/10/2020, 9:37 AM  [facility] Pager: [phone]

## 2020-12-10 NOTE — Progress Notes (Signed)
Pt arrived to HD unit from floor, stable on room air, wearing telemetry  Placed on bedside monitor, fistula in left forearm assessed: aneurysmal, + bruit, + thrill, pt discussed good access maintenance and understanding of safe positions during tx.  Cannulated with venous needle up, arterial needle down, (pt states this is normal), and tx intiated without issues.  Continuing to monitor.

## 2020-12-10 NOTE — Evaluation (Signed)
Physical Therapy Evaluation Patient Details Name: Raymond [name] MRN: [id number] DOB: 03-28-1956 Today's Date: 12/10/2020   History of Present Illness  65yo male admitted 12/09/20 after missing multiple HD sessions. Found to be hyperkalemic and with acute metabolic encephalopathy. PMH CAD, COPD, ESRD on HD, hepatitis C, HTN, substance abuse  Clinical Impression   Patient received in bed, sleeping but easily woken, agreeable to PT. Able to mobilize on a supervision to min guard level with RW, gait distance limited due to elevated HR (up to 125BPM at most with in room activities), patient asymptomatic however. Seems very close to if not at his baseline in terms of mobility. Needed intermittent VC for hand placement for transfers with RW. Left in bed with nephrology MD attending, bed alarm active. Will continue to follow.     Follow Up Recommendations No PT follow up    Equipment Recommendations  None recommended by PT    Recommendations for Other Services       Precautions / Restrictions Precautions Precautions: Fall;Other (comment) Precaution Comments: watch HR/BP Restrictions Weight Bearing Restrictions: No      Mobility  Bed Mobility Overal bed mobility: Modified Independent             General bed mobility comments: HOB elevated, use of rails    Transfers Overall transfer level: Needs assistance Equipment used: Rolling walker (2 wheeled) Transfers: Sit to/from Stand Sit to Stand: Supervision         General transfer comment: S for safety and VC for correct hand placement/sequencing, no physical assist given  Ambulation/Gait Ambulation/Gait assistance: Min guard Gait Distance (Feet): 20 Feet Assistive device: Rolling walker (2 wheeled) Gait Pattern/deviations: Step-through pattern;Trunk flexed Gait velocity: decreased   General Gait Details: slow and steady with RW, no significant gait impairment or deficits noted  Stairs            Wheelchair  Mobility    Modified Rankin (Stroke Patients Only)       Balance Overall balance assessment: Mild deficits observed, not formally tested                                           Pertinent Vitals/Pain Pain Assessment: No/denies pain    Home Living Family/patient expects to be discharged to:: Private residence Living Arrangements: Alone Available Help at Discharge: Family;Friend(s);Available PRN/intermittently Type of Home: Apartment Home Access: Elevator     Home Layout: One level Home Equipment: Grab bars - toilet;Grab bars - tub/shower;Walker - 2 wheels;Cane - single point      Prior Function Level of Independence: Independent               Hand Dominance   Dominant Hand: Right    Extremity/Trunk Assessment   Upper Extremity Assessment Upper Extremity Assessment: Overall WFL for tasks assessed    Lower Extremity Assessment Lower Extremity Assessment: Overall WFL for tasks assessed    Cervical / Trunk Assessment Cervical / Trunk Assessment: Normal  Communication   Communication: No difficulties  Cognition Arousal/Alertness: Awake/alert Behavior During Therapy: WFL for tasks assessed/performed;Flat affect Overall Cognitive Status: Within Functional Limits for tasks assessed                                        General Comments General comments (skin integrity, edema,  etc.): hypertensive but high-normal  for blood pressures, HR as high as 125BPM during session    Exercises     Assessment/Plan    PT Assessment Patient needs continued PT services  PT Problem List Decreased strength;Decreased knowledge of use of DME;Decreased safety awareness;Decreased balance;Decreased mobility;Decreased coordination       PT Treatment Interventions DME instruction;Balance training;Gait training;Stair training;Functional mobility training;Patient/family education;Therapeutic activities;Therapeutic exercise    PT Goals (Current  goals can be found in the Care Plan section)  Acute Rehab PT Goals Patient Stated Goal: go home get back to walking blocks to the store PT Goal Formulation: With patient Time For Goal Achievement: 12/24/20 Potential to Achieve Goals: Good    Frequency Min 3X/week   Barriers to discharge        Co-evaluation               AM-PAC PT "6 Clicks" Mobility  Outcome Measure Help needed turning from your back to your side while in a flat bed without using bedrails?: None Help needed moving from lying on your back to sitting on the side of a flat bed without using bedrails?: None Help needed moving to and from a bed to a chair (including a wheelchair)?: A Little Help needed standing up from a chair using your arms (e.g., wheelchair or bedside chair)?: A Little Help needed to walk in hospital room?: A Little Help needed climbing 3-5 steps with a railing? : A Little 6 Click Score: 20    End of Session Equipment Utilized During Treatment: Gait belt Activity Tolerance: Patient tolerated treatment well Patient left: in bed;with call bell/phone within reach;with bed alarm set Nurse Communication: Mobility status;Other (comment) (vitals during session) PT Visit Diagnosis: Muscle weakness (generalized) (M62.81);Unsteadiness on feet (R26.81)    Time: 2060-1561 PT Time Calculation (min) (ACUTE ONLY): 15 min   Charges:   PT Evaluation $PT Eval Moderate Complexity: 1 Mod          Windell Norfolk, DPT, PN1   Supplemental Physical Therapist Axis    Pager [phone] Acute Rehab Office [phone]

## 2020-12-10 NOTE — Progress Notes (Signed)
Pt completed dialysis tx. Goal achieved: 3L removed Pt tolerated perfectly  BP improved from 160's/110's to 140's/90's Not a single complaint from this gentleman for entire duration.  Report provided to Juliane Poot, RN Sent back to floor on telemetry, room air, stable

## 2020-12-10 NOTE — Progress Notes (Signed)
PROGRESS NOTE  Raymond [name]:[id number] DOB: December 26, 1955   PCP: Raymond [name]., FNP  Patient is from: Home.  Lives alone.  DOA: 12/09/2020 LOS: 1  Chief complaints:  Chief Complaint  Patient presents with  . Shortness of Breath    Brief Narrative / Interim history: 65 year old M with history of ESRD on HD TTS, CAD, PAF not on AC due to GIB, alcohol abuse, tobacco use disorder, hep C s/p treatment, PVD and COPD presenting with increased shortness of breath, fatigue, nausea and altered mental status, and admitted for acute metabolic encephalopathy due to uremia and hyperkalemia in the setting of missed 8 HDs, and hypoglycemia.  He was hypoglycemic to 55 on arrival that has dropped further to 22.  Reportedly, his sister passed away last week.  He was dialyzed with improvement in his mental status and his symptoms.  Subjective: Seen and examined earlier this morning.  No major events overnight or this morning.  Feels better.  Reports some stinging left-sided chest pain.  He rates his pain 4/10.  Denies shortness of breath, cough, GI or UTI symptoms.  Objective: Vitals:   12/10/20 1315 12/10/20 1330 12/10/20 1345 12/10/20 1429  BP: (!) 167/107 (!) 165/108 (!) 171/112 (!) 175/109  Pulse: (!) 51 (!) 57 (!) 58   Resp: 15 (!) 21 17   Temp:      TempSrc:      SpO2: 97% 100% 100%   Weight:        Intake/Output Summary (Last 24 hours) at 12/10/2020 1448 Last data filed at 12/10/2020 1100 Gross per 24 hour  Intake 360 ml  Output 3504 ml  Net -3144 ml   Filed Weights   12/09/20 2030 12/09/20 2300 12/10/20 1300  Weight: 76 kg 71.8 kg 76.8 kg    Examination:  GENERAL: No apparent distress.  Nontoxic. HEENT: MMM.  Vision and hearing grossly intact.  NECK: Supple.  No apparent JVD.  RESP:  No IWOB.  Fair aeration bilaterally. CVS:  RRR. Heart sounds normal.  ABD/GI/GU: BS+. Abd soft, NTND.  MSK/EXT:  Moves extremities. No apparent deformity.  Chronic LLE swelling/varicose  vein SKIN: no apparent skin lesion or wound NEURO: Awake, alert and oriented x4.  No apparent focal neuro deficit. PSYCH: Calm. Normal affect.   Procedures:  Hemodialysis  Microbiology summarized: COVID-19 and influenza PCR nonreactive.  Assessment & Plan: Acute metabolic encephalopathy due to uremia in the setting of missed dialysis (8 sessions) and possibly hypoglycemia.  Resolved after emergent hemodialysis and treatment of hypoglycemia.  Oriented x4 today. -Reorientation and delirium precautions -Continue treating treatable causes  ESRD on HD/uremia/hyperkalemia/AGMA-on HD TTS.  Reportedly, missed 8 sessions of HD. Hyperkalemia and uremia resolved. -S/p emergent HD last night -Plan for HD today  Hypoglycemia likely due to poor p.o. intake and alcohol abuse.  A1c 4.6%.  Hypoglycemia resolved after treatment Recent Labs  Lab 12/09/20 1140 12/09/20 1213 12/09/20 1310 12/09/20 1741 12/10/20 0854  GLUCAP 22* 127* 74 104* 113*  -Continue monitoring CBG twice daily over the next 24 hours   Paroxysmal A. fib: Not on AC due to history of GI bleed from AVM -Continue amiodarone  Elevated troponin/history of CAD/PVD-elevated troponin likely demand ischemia and delayed clearance -Continue Plavix  Noncompliance -Counseled.  Alcohol use disorder: Reports drinking about a pint of wine at times.  Last drink about 2 days prior to presentation.  No withdrawal symptoms. -Initiate CIWA -Multivitamin, thiamine and folic acid -Consult urology for resources.  Tobacco use disorder: Reports  smoking about 4 cigarettes a day. -Encourage cessation -Refused nicotine patch  Pancytopenia: Chronic but worse.  Likely due to alcohol abuse. -Continue monitoring -Check anemia panel  Grief-if sister passed away recently -Emotional support -Chaplain if needed   Body mass index is 25.74 kg/m.         DVT prophylaxis:  heparin injection 5,000 Units Start: 12/09/20 1500  Code  Status: Full code Family Communication: Patient and/or RN. Available if any question.  Level of care: Telemetry Medical Status is: Inpatient  Remains inpatient appropriate because:IV treatments appropriate due to intensity of illness or inability to take PO and Inpatient level of care appropriate due to severity of illness   Dispo: The patient is from: Home              Anticipated d/c is to: Home              Patient currently is not medically stable to d/c.   Difficult to place patient No       Consultants:  Nephrology   Sch Meds:  Scheduled Meds: . amiodarone  200 mg Oral Daily  . calcitRIOL  2.25 mcg Oral Q T,Th,Sa-HD  . Chlorhexidine Gluconate Cloth  6 each Topical Q0600  . cinacalcet  180 mg Oral Q T,Th,Sa-HD  . clopidogrel  75 mg Oral Daily  . folic acid  1 mg Oral Daily  . heparin  5,000 Units Subcutaneous Q8H  . multivitamin with minerals  1 tablet Oral Daily  . nicotine  14 mg Transdermal Daily  . rosuvastatin  10 mg Oral Daily  . sevelamer carbonate  3,200 mg Oral TID WC  . sevelamer carbonate  4,000 mg Oral With snacks  . thiamine  100 mg Oral Daily   Or  . thiamine  100 mg Intravenous Daily   Continuous Infusions: PRN Meds:.acetaminophen **OR** acetaminophen, albuterol, hydrALAZINE, LORazepam **OR** LORazepam  Antimicrobials: Anti-infectives (From admission, onward)   None       I have personally reviewed the following labs and images: CBC: Recent Labs  Lab 12/09/20 1212 12/10/20 0337  WBC 2.8* 2.0*  NEUTROABS 2.2  --   HGB 11.0* 10.1*  HCT 35.5* 31.2*  MCV 105.0* 101.3*  PLT 96* 93*   BMP &GFR Recent Labs  Lab 12/09/20 1212 12/10/20 0337  NA 138 137  K 6.7* 3.7  CL 97* 93*  CO2 19* 29  GLUCOSE 146* 105*  BUN 135* 57*  CREATININE 20.33* 11.44*  CALCIUM 8.9 9.0   Estimated Creatinine Clearance: 6.1 mL/min (A) (by C-G formula based on SCr of 11.44 mg/dL (H)). Liver & Pancreas: No results for input(s): AST, ALT, ALKPHOS,  BILITOT, PROT, ALBUMIN in the last 168 hours. No results for input(s): LIPASE, AMYLASE in the last 168 hours. No results for input(s): AMMONIA in the last 168 hours. Diabetic: No results for input(s): HGBA1C in the last 72 hours. Recent Labs  Lab 12/09/20 1140 12/09/20 1213 12/09/20 1310 12/09/20 1741 12/10/20 0854  GLUCAP 22* 127* 74 104* 113*   Cardiac Enzymes: No results for input(s): CKTOTAL, CKMB, CKMBINDEX, TROPONINI in the last 168 hours. No results for input(s): PROBNP in the last 8760 hours. Coagulation Profile: No results for input(s): INR, PROTIME in the last 168 hours. Thyroid Function Tests: Recent Labs    12/09/20 1807  TSH 2.734   Lipid Profile: No results for input(s): CHOL, HDL, LDLCALC, TRIG, CHOLHDL, LDLDIRECT in the last 72 hours. Anemia Panel: No results for input(s): VITAMINB12, FOLATE, FERRITIN, TIBC, IRON,  RETICCTPCT in the last 72 hours. Urine analysis:    Component Value Date/Time   COLORURINE YELLOW 05/08/2010 1335   APPEARANCEUR CLEAR 05/08/2010 1335   LABSPEC 1.009 05/08/2010 1335   PHURINE 6.0 05/08/2010 1335   GLUCOSEU NEGATIVE 05/08/2010 1335   HGBUR NEGATIVE 05/08/2010 1335   BILIRUBINUR NEGATIVE 05/08/2010 1335   KETONESUR NEGATIVE 05/08/2010 1335   PROTEINUR 100 (A) 05/08/2010 1335   UROBILINOGEN 0.2 05/08/2010 1335   NITRITE NEGATIVE 05/08/2010 1335   LEUKOCYTESUR SMALL (A) 05/08/2010 1335   Sepsis Labs: Invalid input(s): PROCALCITONIN, LACTICIDVEN  Microbiology: Recent Results (from the past 240 hour(s))  Resp Panel by RT-PCR (Flu A&B, Covid) Nasopharyngeal Swab     Status: None   Collection Time: 12/09/20  1:54 PM   Specimen: Nasopharyngeal Swab; Nasopharyngeal(NP) swabs in vial transport medium  Result Value Ref Range Status   SARS Coronavirus 2 by RT PCR NEGATIVE NEGATIVE Final    Comment: (NOTE) SARS-CoV-2 target nucleic acids are NOT DETECTED.  The SARS-CoV-2 RNA is generally detectable in upper respiratory specimens  during the acute phase of infection. The lowest concentration of SARS-CoV-2 viral copies this assay can detect is 138 copies/mL. A negative result does not preclude SARS-Cov-2 infection and should not be used as the sole basis for treatment or other patient management decisions. A negative result may occur with  improper specimen collection/handling, submission of specimen other than nasopharyngeal swab, presence of viral mutation(s) within the areas targeted by this assay, and inadequate number of viral copies(<138 copies/mL). A negative result must be combined with clinical observations, patient history, and epidemiological information. The expected result is Negative.  Fact Sheet for Patients:  BloggerCourse.com  Fact Sheet for Healthcare Providers:  SeriousBroker.it  This test is no t yet approved or cleared by the [place] FDA and  has been authorized for detection and/or diagnosis of SARS-CoV-2 by FDA under an Emergency Use Authorization (EUA). This EUA will remain  in effect (meaning this test can be used) for the duration of the COVID-19 declaration under Section 564(b)(1) of the Act, 21 U.S.C.section 360bbb-3(b)(1), unless the authorization is terminated  or revoked sooner.       Influenza A by PCR NEGATIVE NEGATIVE Final   Influenza B by PCR NEGATIVE NEGATIVE Final    Comment: (NOTE) The Xpert Xpress SARS-CoV-2/FLU/RSV plus assay is intended as an aid in the diagnosis of influenza from Nasopharyngeal swab specimens and should not be used as a sole basis for treatment. Nasal washings and aspirates are unacceptable for Xpert Xpress SARS-CoV-2/FLU/RSV testing.  Fact Sheet for Patients: BloggerCourse.com  Fact Sheet for Healthcare Providers: SeriousBroker.it  This test is not yet approved or cleared by the [place] FDA and has been authorized for detection  and/or diagnosis of SARS-CoV-2 by FDA under an Emergency Use Authorization (EUA). This EUA will remain in effect (meaning this test can be used) for the duration of the COVID-19 declaration under Section 564(b)(1) of the Act, 21 U.S.C. section 360bbb-3(b)(1), unless the authorization is terminated or revoked.  Performed at [facility], [address], [place], [place] [address]     Radiology Studies: No results found.    Lene Mckay T. Surabhi Gadea [facility]  If 7PM-7AM, please contact night-coverage [email or web address] 12/10/2020, 2:48 PM

## 2020-12-10 NOTE — TOC Initial Note (Signed)
Transition of Care [facility]) - Initial/Assessment Note    Patient Details  Name: Raymond [name] MRN: [id number] Date of Birth: Aug 15, 1955  [facility] CM/SW Contact:    Verdell Carmine, RN Phone Number: 12/10/2020, 5:02 PM  Clinical Narrative:                 65 YO patient admitted with [facility] after missed dialysis. ESRD. afib  On amioderone. Patient felt better once dialyzed.   Lives in apartment, has PCP. No needs at this time but CM will follow.   Expected Discharge Plan: Home/Self Care Barriers to Discharge: Continued Medical Work up   Patient Goals and CMS Choice        Expected Discharge Plan and Services Expected Discharge Plan: Home/Self Care       Living arrangements for the past 2 months: Apartment                                      Prior Living Arrangements/Services Living arrangements for the past 2 months: Apartment Lives with:: Self Patient language and need for interpreter reviewed:: Yes        Need for Family Participation in Patient Care: Yes (Comment) Care giver support system in place?: Yes (comment)   Criminal Activity/Legal Involvement Pertinent to Current Situation/Hospitalization: No - Comment as needed  Activities of Daily Living Home Assistive Devices/Equipment: Cane (specify quad or straight),Walker (specify type) ADL Screening (condition at time of admission) Patient's cognitive ability adequate to safely complete daily activities?: Yes Is the patient deaf or have difficulty hearing?: No Does the patient have difficulty seeing, even when wearing glasses/contacts?: No Does the patient have difficulty concentrating, remembering, or making decisions?: No Patient able to express need for assistance with ADLs?: Yes Does the patient have difficulty dressing or bathing?: No Independently performs ADLs?: Yes (appropriate for developmental age) Does the patient have difficulty walking or climbing stairs?: Yes Weakness of Legs:  Both Weakness of Arms/Hands: None  Permission Sought/Granted                  Emotional Assessment       Orientation: : Oriented to Self,Oriented to Place,Oriented to  Time,Oriented to Situation Alcohol / Substance Use: Alcohol Use Psych Involvement: No (comment)  Admission diagnosis:  Hyperkalemia [E87.5] Hypoglycemia [E16.2] ESRD (end stage renal disease) ([place]) [N18.6] Patient Active Problem List   Diagnosis Date Noted  . Acute upper GI bleed 07/29/2020  . Acute metabolic encephalopathy 15/40/0867  . COVID-19 virus infection 07/29/2020  . Gastric AVM   . AVM (arteriovenous malformation) of small bowel, acquired with hemorrhage   . Current use of long term anticoagulation   . History of arteriovenous malformation (AVM) 01/14/2020  . UGIB (upper gastrointestinal bleed) 01/14/2020  . Acute blood loss anemia 01/14/2020  . Elevated troponin 01/14/2020  . Gastroenteritis 12/24/2018  . Substance use disorder 12/24/2018  . Noncompliance of patient with renal dialysis ([place]) 09/12/2018  . Benign neoplasm of colon   . Melena   . Anemia due to chronic kidney disease, on chronic dialysis ([place])   . Acute on chronic anemia   . H/O medication noncompliance   . Polysubstance (excluding opioids) dependence ([place])   . Atrial flutter with rapid ventricular response ([place]) 08/10/2018  . PAF (paroxysmal atrial fibrillation) ([place]) 08/01/2018  . Acute respiratory failure with hypoxia ([place]) 07/13/2018  . Non-compliance with renal dialysis ([place]) 07/13/2018  .  PAD (peripheral artery disease) ([place]) 04/11/2018  . Sepsis ([place]) 03/31/2018  . Preop cardiovascular exam 03/29/2018  . Volume overload 08/07/2017  . Acute pulmonary edema ([facility])   . Dyspnea 06/20/2017  . Acute bronchitis   . COPD (chronic obstructive pulmonary disease) ([place])   . End-stage renal disease on hemodialysis ([place])   . Hypoxia 12/05/2016  . Hyperkalemia 12/19/2015  . Hypoglycemia 12/19/2015  . Polysubstance abuse ([place])  12/19/2015  . Homeless 12/19/2015  . Anemia associated with stage 5 chronic renal failure ([place]) 12/19/2015  . Atypical chest pain 12/19/2013  . Atherosclerosis of native arteries of extremity with intermittent claudication ([place]) 12/04/2012  . ESRD (end stage renal disease) ([place]) 12/04/2012  . HEPATITIS C 09/07/2006  . HYPERCHOLESTEROLEMIA 09/07/2006  . HYPERTENSION, BENIGN SYSTEMIC 09/07/2006   PCP:  [name], FNP Pharmacy:   [facility] [place], [place] [address] Phone: [phone] Fax: [phone]     Social Determinants of Health (SDOH) Interventions    Readmission Risk Interventions Readmission Risk Prevention Plan 06/22/2020  Transportation Screening Complete  Medication Review ([place]) Complete  PCP or Specialist appointment within 3-5 days of discharge Complete  HRI or [place] Complete  SW Recovery Care/Counseling Consult Complete  [place] Patient Refused  Some recent data might be hidden

## 2020-12-11 ENCOUNTER — Other Ambulatory Visit (HOSPITAL_COMMUNITY): Payer: Self-pay

## 2020-12-11 DIAGNOSIS — I484 Atypical atrial flutter: Secondary | ICD-10-CM

## 2020-12-11 DIAGNOSIS — N186 End stage renal disease: Secondary | ICD-10-CM

## 2020-12-11 DIAGNOSIS — I480 Paroxysmal atrial fibrillation: Secondary | ICD-10-CM

## 2020-12-11 DIAGNOSIS — E8779 Other fluid overload: Secondary | ICD-10-CM

## 2020-12-11 LAB — CBC WITH DIFFERENTIAL/PLATELET
Abs Immature Granulocytes: 0.01 10*3/uL (ref 0.00–0.07)
Basophils Absolute: 0 10*3/uL (ref 0.0–0.1)
Basophils Relative: 1 %
Eosinophils Absolute: 0 10*3/uL (ref 0.0–0.5)
Eosinophils Relative: 1 %
HCT: 37.4 % — ABNORMAL LOW (ref 39.0–52.0)
Hemoglobin: 11.9 g/dL — ABNORMAL LOW (ref 13.0–17.0)
Immature Granulocytes: 1 %
Lymphocytes Relative: 32 %
Lymphs Abs: 0.7 10*3/uL (ref 0.7–4.0)
MCH: 32.4 pg (ref 26.0–34.0)
MCHC: 31.8 g/dL (ref 30.0–36.0)
MCV: 101.9 fL — ABNORMAL HIGH (ref 80.0–100.0)
Monocytes Absolute: 0.5 10*3/uL (ref 0.1–1.0)
Monocytes Relative: 24 %
Neutro Abs: 0.9 10*3/uL — ABNORMAL LOW (ref 1.7–7.7)
Neutrophils Relative %: 41 %
Platelets: 117 10*3/uL — ABNORMAL LOW (ref 150–400)
RBC: 3.67 MIL/uL — ABNORMAL LOW (ref 4.22–5.81)
RDW: 19.6 % — ABNORMAL HIGH (ref 11.5–15.5)
WBC: 2.2 10*3/uL — ABNORMAL LOW (ref 4.0–10.5)
nRBC: 0 % (ref 0.0–0.2)

## 2020-12-11 LAB — RENAL FUNCTION PANEL
Albumin: 3 g/dL — ABNORMAL LOW (ref 3.5–5.0)
Anion gap: 11 (ref 5–15)
BUN: 24 mg/dL — ABNORMAL HIGH (ref 8–23)
CO2: 33 mmol/L — ABNORMAL HIGH (ref 22–32)
Calcium: 9 mg/dL (ref 8.9–10.3)
Chloride: 97 mmol/L — ABNORMAL LOW (ref 98–111)
Creatinine, Ser: 8.17 mg/dL — ABNORMAL HIGH (ref 0.61–1.24)
GFR, Estimated: 7 mL/min — ABNORMAL LOW (ref >60)
Glucose, Bld: 101 mg/dL — ABNORMAL HIGH (ref 70–99)
Phosphorus: 5 mg/dL — ABNORMAL HIGH (ref 2.5–4.6)
Potassium: 3.4 mmol/L — ABNORMAL LOW (ref 3.5–5.1)
Sodium: 141 mmol/L (ref 135–145)

## 2020-12-11 LAB — GLUCOSE, CAPILLARY
Glucose-Capillary: 116 mg/dL — ABNORMAL HIGH (ref 70–99)
Glucose-Capillary: 127 mg/dL — ABNORMAL HIGH (ref 70–99)

## 2020-12-11 LAB — HEPATITIS B E ANTIBODY: Hep B E Ab: NEGATIVE

## 2020-12-11 LAB — FOLATE: Folate: 12.1 ng/mL (ref >5.9)

## 2020-12-11 LAB — FERRITIN: Ferritin: 125 ng/mL (ref 24–336)

## 2020-12-11 LAB — IRON AND TIBC
Iron: 81 ug/dL (ref 45–182)
Saturation Ratios: 25 % (ref 17.9–39.5)
TIBC: 330 ug/dL (ref 250–450)
UIBC: 249 ug/dL

## 2020-12-11 LAB — MAGNESIUM: Magnesium: 2.2 mg/dL (ref 1.7–2.4)

## 2020-12-11 LAB — RETICULOCYTES
Immature Retic Fract: 11.6 % (ref 2.3–15.9)
RBC.: 3.64 MIL/uL — ABNORMAL LOW (ref 4.22–5.81)
Retic Count, Absolute: 63.3 10*3/uL (ref 19.0–186.0)
Retic Ct Pct: 1.7 % (ref 0.4–3.1)

## 2020-12-11 LAB — VITAMIN B12: Vitamin B-12: 1284 pg/mL — ABNORMAL HIGH (ref 180–914)

## 2020-12-11 MED ORDER — AMIODARONE HCL 200 MG PO TABS
200.0000 mg | ORAL_TABLET | Freq: Every day | ORAL | 1 refills | Status: DC
  Filled 2020-12-11: qty 30, 30d supply, fill #0
  Filled 2020-12-11: qty 90, 90d supply, fill #0

## 2020-12-11 MED ORDER — FOLIC ACID 1 MG PO TABS
1.0000 mg | ORAL_TABLET | Freq: Every day | ORAL | 1 refills | Status: DC
  Filled 2020-12-11 (×2): qty 90, 90d supply, fill #0

## 2020-12-11 MED ORDER — POTASSIUM CHLORIDE CRYS ER 20 MEQ PO TBCR
40.0000 meq | EXTENDED_RELEASE_TABLET | Freq: Once | ORAL | Status: AC
  Administered 2020-12-11: 40 meq via ORAL
  Filled 2020-12-11: qty 2

## 2020-12-11 MED ORDER — PROCHLORPERAZINE MALEATE 5 MG PO TABS
5.0000 mg | ORAL_TABLET | Freq: Three times a day (TID) | ORAL | 0 refills | Status: DC | PRN
  Filled 2020-12-11 (×2): qty 20, 7d supply, fill #0

## 2020-12-11 MED ORDER — CARVEDILOL 6.25 MG PO TABS
6.2500 mg | ORAL_TABLET | Freq: Two times a day (BID) | ORAL | Status: DC
  Administered 2020-12-11: 6.25 mg via ORAL
  Filled 2020-12-11: qty 1

## 2020-12-11 MED ORDER — THIAMINE HCL 100 MG PO TABS
100.0000 mg | ORAL_TABLET | Freq: Every day | ORAL | 1 refills | Status: DC
  Filled 2020-12-11 (×2): qty 90, 90d supply, fill #0

## 2020-12-11 MED ORDER — ADULT MULTIVITAMIN W/MINERALS CH: 1.0000 | ORAL_TABLET | Freq: Every day | ORAL | 1 refills | Status: DC

## 2020-12-11 MED ORDER — CINACALCET HCL 30 MG PO TABS
180.0000 mg | ORAL_TABLET | ORAL | 1 refills | Status: DC
  Filled 2020-12-11: qty 60, 24d supply, fill #0

## 2020-12-11 MED ORDER — ROSUVASTATIN CALCIUM 10 MG PO TABS
10.0000 mg | ORAL_TABLET | Freq: Every day | ORAL | 1 refills | Status: DC
  Filled 2020-12-11 (×2): qty 90, 90d supply, fill #0

## 2020-12-11 MED ORDER — CARVEDILOL 6.25 MG PO TABS
6.2500 mg | ORAL_TABLET | Freq: Two times a day (BID) | ORAL | 1 refills | Status: DC
Start: 2020-12-11 — End: 2021-04-15
  Filled 2020-12-11 (×2): qty 180, 90d supply, fill #0

## 2020-12-11 MED ORDER — NICOTINE 14 MG/24HR TD PT24: 14.0000 mg | MEDICATED_PATCH | Freq: Every day | TRANSDERMAL | 0 refills | Status: DC

## 2020-12-11 MED ORDER — CALCITRIOL 0.25 MCG PO CAPS
2.2500 ug | ORAL_CAPSULE | ORAL | 1 refills | Status: DC
  Filled 2020-12-11: qty 60, 14d supply, fill #0

## 2020-12-11 NOTE — Progress Notes (Signed)
[facility] Progress Note   Subjective:     Seen in room. No new complaints this am. Denies cp, sob.  Objective Vitals:   12/10/20 2329 12/11/20 0314 12/11/20 0615 12/11/20 0939  BP: (!) 123/95 (!) 143/94 (!) 146/109 (!) 133/104  Pulse: (!) 106 (!) 103 (!) 109 95  Resp: 19 16 17 16   Temp: 98.3 F (36.8 C) 97.8 F (36.6 C) 97.7 F (36.5 C) 98 F (36.7 C)  TempSrc: Oral Oral Oral   SpO2: 96% 98% 96% 95%  Weight:   71.6 kg    Physical Exam General: Well developed, well nourished, in no acute distress. Lungs: Breathing is unlabored on RA. Lungs CTA bilaterally Heart: RRR with normal S1, S2. No murmurs, rubs, or gallops appreciated. Abdomen: Soft, non-tender, non-distended  Lower extremities: No edema bilateral lower extremities Dialysis Access: LUE AVF + thrill and bruit, multiple large aneurysms noted  Additional Objective Labs: Basic Metabolic Panel: Recent Labs  Lab 12/09/20 1212 12/10/20 0337 12/11/20 0559  NA 138 137 141  K 6.7* 3.7 3.4*  CL 97* 93* 97*  CO2 19* 29 33*  GLUCOSE 146* 105* 101*  BUN 135* 57* 24*  CREATININE 20.33* 11.44* 8.17*  CALCIUM 8.9 9.0 9.0  PHOS  --   --  5.0*   CBC: Recent Labs  Lab 12/09/20 1212 12/10/20 0337 12/11/20 0559  WBC 2.8* 2.0* 2.2*  NEUTROABS 2.2  --  0.9*  HGB 11.0* 10.1* 11.9*  HCT 35.5* 31.2* 37.4*  MCV 105.0* 101.3* 101.9*  PLT 96* 93* 117*   Blood Culture    Component Value Date/Time   SDES BLOOD RIGHT HAND 06/21/2020 0239   SPECREQUEST  06/21/2020 0239    BOTTLES DRAWN AEROBIC AND ANAEROBIC Blood Culture adequate volume   CULT  06/21/2020 0239    NO GROWTH 5 DAYS Performed at [facility], [place] [address], [place], [place] [address]    REPTSTATUS 06/26/2020 FINAL 06/21/2020 0239   CBG: Recent Labs  Lab 12/09/20 1741 12/10/20 0854 12/10/20 1648 12/10/20 1918 12/11/20 0314  GLUCAP 104* 113* 117* 123* 116*   Studies/Results: DG Chest 2 View  Result Date:  12/09/2020 CLINICAL DATA:  Shortness of breath.  Hypoxia. EXAM: CHEST - 2 VIEW COMPARISON:  One-view chest x-ray 07/28/2020 FINDINGS: Heart is enlarged. Mild pulmonary vascular congestion noted. Significant effusion. No airspace disease is present. Axial skeleton is unremarkable. IMPRESSION: Cardiomegaly and mild pulmonary vascular congestion. Electronically Signed   By: San Morelle M.D.   On: 12/09/2020 11:06   Medications:  . amiodarone  200 mg Oral Daily  . calcitRIOL  2.25 mcg Oral Q T,Th,Sa-HD  . carvedilol  6.25 mg Oral BID WC  . cinacalcet  180 mg Oral Q T,Th,Sa-HD  . clopidogrel  75 mg Oral Daily  . folic acid  1 mg Oral Daily  . heparin  5,000 Units Subcutaneous Q8H  . multivitamin with minerals  1 tablet Oral Daily  . nicotine  14 mg Transdermal Daily  . rosuvastatin  10 mg Oral Daily  . sevelamer carbonate  3,200 mg Oral TID WC  . sevelamer carbonate  4,000 mg Oral With snacks  . thiamine  100 mg Oral Daily   Or  . thiamine  100 mg Intravenous Daily    Outpatient Dialysis Orders:  Center: [facility]  on TTS. 180NRe, TTS, 4 hours, BFR 450, DFR Auto 1.5, EDW 69.5kg, 2K/2Ca, AVF15g No heparin Mircera 150 mcg IV q 2 weeks 119mcg on 5/21 Venofer 100mg   IV q HD- received 0 doses so far Calcitriol 2.25 mcg PO q HD Sensipar 180mg  PO q HD  Assessment/Plan: 1. ESRD:  Missed HD x 8 days prior to admission.  BUN and creatinine severely elevated on admission, improved with HD>  Resume TTS schedule. Stop PO bicarb. Next HD 6/4. Can resume at outpatient center.  2. Hyperkalemia: Due to missed HD. Resolved with HD. K+ 3.4  3. Hypertension/volume: BP significantly elevated with pulmonary edema on chest x-ray. O2 sat currently stable on room air. Net 6.5L this admission. Continue UF as tolerated.  4.  Anemia: Hgb 11.9. No ESA indicated at this time, will follow trend.  5.  Metabolic bone disease: Calcium controlled. Continue calcitriol and sensipar, Renvela.  6.  Nutrition:  Renal  diet/fluid restrictions   Lynnda Child PA-C [facility] 12/11/2020,10:02 AM

## 2020-12-11 NOTE — Progress Notes (Signed)
Physical Therapy Treatment Patient Details Name: CLEOPHUS MENDONSA MRN: [id number] DOB: 19-Jul-1955 Today's Date: 12/11/2020    History of Present Illness 65yo male admitted 12/09/20 after missing multiple HD sessions. Found to be hyperkalemic and with acute metabolic encephalopathy. PMH CAD, COPD, ESRD on HD, hepatitis C, HTN, substance abuse    PT Comments    Pt doing well with mobility and no further PT needed.  Ready for dc from PT standpoint.     Follow Up Recommendations  No PT follow up     Equipment Recommendations  None recommended by PT    Recommendations for Other Services       Precautions / Restrictions Precautions Precautions: None    Mobility  Bed Mobility Overal bed mobility: Modified Independent             General bed mobility comments: HOB elevated    Transfers Overall transfer level: Modified independent Equipment used: Rolling walker (2 wheeled);None Transfers: Sit to/from Stand Sit to Stand: Modified independent (Device/Increase time)            Ambulation/Gait Ambulation/Gait assistance: Modified independent (Device/Increase time) Gait Distance (Feet): 225 Feet Assistive device: Rolling walker (2 wheeled);None Gait Pattern/deviations: Step-through pattern Gait velocity: decreased Gait velocity interpretation: 1.31 - 2.62 ft/sec, indicative of limited community ambulator General Gait Details: Steady gait with walker in hallway and without walker for short distance in room   Stairs             Wheelchair Mobility    Modified Rankin (Stroke Patients Only)       Balance Overall balance assessment: Mild deficits observed, not formally tested                                          Cognition Arousal/Alertness: Awake/alert Behavior During Therapy: WFL for tasks assessed/performed;Flat affect Overall Cognitive Status: Within Functional Limits for tasks assessed                                         Exercises      General Comments        Pertinent Vitals/Pain Pain Assessment: No/denies pain    Home Living                      Prior Function            PT Goals (current goals can now be found in the care plan section) Acute Rehab PT Goals Patient Stated Goal: go home get back to walking blocks to the store Progress towards PT goals: Goals met and updated - see care plan    Frequency           PT Plan Other (comment) (Pt DC'd from PT)    Co-evaluation              AM-PAC PT "6 Clicks" Mobility   Outcome Measure  Help needed turning from your back to your side while in a flat bed without using bedrails?: None Help needed moving from lying on your back to sitting on the side of a flat bed without using bedrails?: None Help needed moving to and from a bed to a chair (including a wheelchair)?: None Help needed standing up from a chair using your arms (e.g., wheelchair or  bedside chair)?: None Help needed to walk in hospital room?: None Help needed climbing 3-5 steps with a railing? : None 6 Click Score: 24    End of Session   Activity Tolerance: Patient tolerated treatment well Patient left: in bed;with call bell/phone within reach   PT Visit Diagnosis: Muscle weakness (generalized) (M62.81);Unsteadiness on feet (R26.81)     Time: 5369-2230 PT Time Calculation (min) (ACUTE ONLY): 8 min  Charges:  $Gait Training: 8-22 mins                     [place] Pager [phone] Office [place] 12/11/2020, 10:15 AM

## 2020-12-11 NOTE — Plan of Care (Signed)
  Problem: Education: Goal: Knowledge of General Education information will improve Description Including pain rating scale, medication(s)/side effects and non-pharmacologic comfort measures Outcome: Progressing   Problem: Clinical Measurements: Goal: Diagnostic test results will improve Outcome: Progressing Goal: Respiratory complications will improve Outcome: Progressing   

## 2020-12-11 NOTE — Progress Notes (Addendum)
DISCHARGE NOTE HOME Raymond [name] to be discharged Home per MD order. Discussed prescriptions and follow up appointments with the patient. Prescriptions given to patient; medication list explained in detail. Patient verbalized understanding.  Skin clean, dry and intact without evidence of skin break down, no evidence of skin tears noted. IV catheter discontinued intact. Site without signs and symptoms of complications. Dressing and pressure applied. Pt denies pain at the site currently. No complaints noted.  Patient free of lines, drains, and wounds.   An After Visit Summary (AVS) was printed and given to the patient. Patient ambulated on his own, and discharged home via public transportation.  Dolores Hoose, RN

## 2020-12-11 NOTE — Plan of Care (Signed)
°  Problem: Coping: °Goal: Level of anxiety will decrease °Outcome: Progressing °  °

## 2020-12-11 NOTE — Plan of Care (Signed)
  Problem: Education: Goal: Knowledge of General Education information will improve Description: Including pain rating scale, medication(s)/side effects and non-pharmacologic comfort measures 12/11/2020 1204 by Dolores Hoose, RN Outcome: Adequate for Discharge 12/11/2020 0731 by Dolores Hoose, RN Outcome: Progressing   Problem: Health Behavior/Discharge Planning: Goal: Ability to manage health-related needs will improve Outcome: Adequate for Discharge   Problem: Clinical Measurements: Goal: Ability to maintain clinical measurements within normal limits will improve Outcome: Adequate for Discharge Goal: Will remain free from infection Outcome: Adequate for Discharge Goal: Diagnostic test results will improve 12/11/2020 1204 by Dolores Hoose, RN Outcome: Adequate for Discharge 12/11/2020 0731 by Dolores Hoose, RN Outcome: Progressing Goal: Respiratory complications will improve 12/11/2020 1204 by Dolores Hoose, RN Outcome: Adequate for Discharge 12/11/2020 0731 by Dolores Hoose, RN Outcome: Progressing Goal: Cardiovascular complication will be avoided Outcome: Adequate for Discharge   Problem: Activity: Goal: Risk for activity intolerance will decrease Outcome: Adequate for Discharge   Problem: Nutrition: Goal: Adequate nutrition will be maintained Outcome: Adequate for Discharge   Problem: Coping: Goal: Level of anxiety will decrease Outcome: Adequate for Discharge   Problem: Elimination: Goal: Will not experience complications related to bowel motility Outcome: Adequate for Discharge Goal: Will not experience complications related to urinary retention Outcome: Adequate for Discharge   Problem: Pain Managment: Goal: General experience of comfort will improve Outcome: Adequate for Discharge   Problem: Safety: Goal: Ability to remain free from injury will improve Outcome: Adequate for Discharge   Problem: Skin Integrity: Goal: Risk for impaired skin  integrity will decrease Outcome: Adequate for Discharge   Problem: Education: Goal: Knowledge of disease and its progression will improve Outcome: Adequate for Discharge Goal: Individualized Educational Video(s) Outcome: Adequate for Discharge   Problem: Fluid Volume: Goal: Compliance with measures to maintain balanced fluid volume will improve Outcome: Adequate for Discharge   Problem: Health Behavior/Discharge Planning: Goal: Ability to manage health-related needs will improve Outcome: Adequate for Discharge   Problem: Nutritional: Goal: Ability to make healthy dietary choices will improve Outcome: Adequate for Discharge   Problem: Clinical Measurements: Goal: Complications related to the disease process, condition or treatment will be avoided or minimized Outcome: Adequate for Discharge   Problem: Acute Rehab PT Goals(only PT should resolve) Goal: Pt/caregiver will Perform Home Exercise Program Outcome: Adequate for Discharge

## 2020-12-11 NOTE — Discharge Summary (Signed)
Physician Discharge Summary  Raymond [name] SJG:[id number] DOB: 1956/01/14 DOA: 12/09/2020  PCP: [name], FNP  Admit date: 12/09/2020 Discharge date: 12/11/2020  Admitted From: Home Disposition: Home  Recommendations for Outpatient Follow-up:  1. Follow ups as below. 2. Please obtain CBC/BMP/Mag at follow up 3. Please follow up on the following pending results: None  Home Health: None required Equipment/Devices: None required  Discharge Condition: Stable CODE STATUS: Full code   Follow-up Wickliffe., FNP. Schedule an appointment as soon as possible for a visit in 1 week(s).   Specialty: Family Medicine Contact information: [place] [address] [phone]        Josue Hector, MD .   Specialty: Cardiology Contact information: [phone] [place] [address] [phone]                Hospital Course: 65 year old M with history of ESRD on HD TTS, CAD, PAF not on AC due to GIB/hematuria, alcohol abuse, tobacco use disorder, hep C s/p treatment, PVD and COPD presenting with increased shortness of breath, fatigue, nausea and altered mental status, and admitted for acute metabolic encephalopathy due to uremia and hyperkalemia in the setting of missed 8 HDs, and hypoglycemia.  He was hypoglycemic to 55 on arrival that has dropped further to 22.  Reportedly, his sister passed away last week.    Patient's symptoms resolved after dialysis.  He was evaluated by therapy and no needles identified.  Patient has been encouraged to follow-up with his PCP and his cardiologist.  See individual problem list below for more on  hospital course. Discharge Diagnoses:  Acute metabolic encephalopathy due to uremia in the setting of missed dialysis (8 sessions) and possibly hypoglycemia.  Resolved after emergent hemodialysis and treatment of hypoglycemia. Resolved. -Encouraged to go to his outpatient dialysis and take  his medications as prescribed  ESRD on HD/uremia/hyperkalemia/AGMA-on HD TTS.  Reportedly, missed 8 sessions of HD. Hyperkalemia and uremia resolved. -Patient to resume outpatient dialysis on schedule  Hypoglycemia likely due to poor p.o. intake and alcohol abuse.  A1c 4.6%.  Resolved.  Paroxysmal A. fib/a flutter with mild RVR: Not on AC due to history of GI bleed from AVM -Continue amiodarone -Added low-dose Coreg mainly for blood pressure -Encouraged to follow-up with his cardiologist  Essential hypertension: BP slightly elevated. -Started on low-dose Coreg  Elevated troponin/history of CAD/PVD-elevated troponin likely demand ischemia and delayed clearance -On Plavix -Added Coreg mainly for blood pressure  Noncompliance -Counseled.  Alcohol use disorder: Reports drinking about a pint of wine at times.  No withdrawal symptoms. -Encouraged to quit drinking  Tobacco use disorder: Reports smoking about 4 cigarettes a day. -Encourage cessation -Refused nicotine patch  Pancytopenia: Chronic but worse.  Likely due to alcohol abuse.  Improving -Recheck in 1 to 2 weeks  Grief-if sister passed away recently -Emotional support    Body mass index is 24 kg/m.            Discharge Exam: Vitals:   12/11/20 0939 12/11/20 1237  BP: (!) 133/104 (!) 130/105  Pulse: 95 91  Resp: 16 18  Temp: 98 F (36.7 C) 98 F (36.7 C)  SpO2: 95% 100%    GENERAL: No apparent distress.  Nontoxic. HEENT: MMM.  Vision and hearing grossly intact.  NECK: Supple.  No apparent JVD.  RESP: On RA.  No IWOB.  Fair aeration bilaterally. CVS: Irregular rhythm.  Normal rate.[name]  Heart sounds normal.  aVF over LUE with good bruits. ABD/GI/GU: Bowel sounds present. Soft. Non tender.  MSK/EXT:  Moves extremities. No apparent deformity.  LLE edema with varicose vein (chronic) SKIN: no apparent skin lesion or wound NEURO: Awake, alert and oriented appropriately.  No apparent focal neuro  deficit. PSYCH: Calm. Normal affect.  Discharge Instructions  Discharge Instructions    Call MD for:  difficulty breathing, headache or visual disturbances   Complete by: As directed    Call MD for:  extreme fatigue   Complete by: As directed    Call MD for:  persistant dizziness or light-headedness   Complete by: As directed    Call MD for:  persistant nausea and vomiting   Complete by: As directed    Call MD for:  temperature >100.4   Complete by: As directed    Diet - low sodium heart healthy   Complete by: As directed    Discharge instructions   Complete by: As directed    It has been a pleasure taking care of you!  You were hospitalized you were hospitalized due to shortness of breath, fatigue, nausea and confusion likely from missing dialysis.  Your symptoms resolved after dialysis.  It is very important that you go to dialysis as a schedule.  We have made some adjustment to your home medications and a/or added new medication during this hospitalization.  Please review your new medication list and the directions on your medications before you take them.  Please follow-up with your primary care doctor  and cardiologist in 1 to 2 weeks   Take care,   Increase activity slowly   Complete by: As directed      Allergies as of 12/11/2020   No Known Allergies     Medication List    STOP taking these medications   oxyCODONE-acetaminophen 5-325 MG tablet Commonly known as: Percocet   pantoprazole 40 MG tablet Commonly known as: Protonix     TAKE these medications   albuterol 108 (90 Base) MCG/ACT inhaler Commonly known as: VENTOLIN HFA Inhale 2 puffs into the lungs as needed for shortness of breath or wheezing.   amiodarone 200 MG tablet Commonly known as: PACERONE Take 1 tablet (200 mg total) by mouth daily. What changed:   how much to take  how to take this  when to take this   calcitRIOL 0.25 MCG capsule Commonly known as: ROCALTROL Take 9 capsules (2.25  mcg total) by mouth Every Tuesday,Thursday,and Saturday with dialysis. Start taking on: December 12, 2020   carvedilol 6.25 MG tablet Commonly known as: COREG Take 1 tablet (6.25 mg total) by mouth 2 (two) times daily with a meal.   cinacalcet 30 MG tablet Commonly known as: SENSIPAR Take 6 tablets (180 mg total) by mouth Every Tuesday,Thursday,and Saturday with dialysis. Start taking on: December 12, 2020   clopidogrel 75 MG tablet Commonly known as: PLAVIX TAKE 1 TABLET (75 MG TOTAL) BY MOUTH DAILY.   folic acid 1 MG tablet Commonly known as: FOLVITE Take 1 tablet (1 mg total) by mouth daily. Start taking on: December 12, 2020   multivitamin with minerals Tabs tablet Take 1 tablet by mouth daily. Start taking on: December 12, 2020   nicotine 14 mg/24hr patch Commonly known as: NICODERM CQ - dosed in mg/24 hours Place 1 patch (14 mg total) onto the skin daily. Start taking on: December 12, 2020   prochlorperazine 5 MG tablet Commonly known as: COMPAZINE Take 1 tablet (5  mg total) by mouth every 8 (eight) hours as needed for nausea or vomiting.   rosuvastatin 10 MG tablet Commonly known as: CRESTOR Take 1 tablet (10 mg total) by mouth daily. What changed: how much to take   sevelamer carbonate 800 MG tablet Commonly known as: RENVELA Take 4 tablets (3,200 mg total) by mouth 3 (three) times daily with meals. What changed:   how much to take  additional instructions   thiamine 100 MG tablet Take 1 tablet (100 mg total) by mouth daily. Start taking on: December 12, 2020       Consultations:  Nephrology Procedures/Studies:   DG Chest 2 View  Result Date: 12/09/2020 CLINICAL DATA:  Shortness of breath.  Hypoxia. EXAM: CHEST - 2 VIEW COMPARISON:  One-view chest x-ray 07/28/2020 FINDINGS: Heart is enlarged. Mild pulmonary vascular congestion noted. Significant effusion. No airspace disease is present. Axial skeleton is unremarkable. IMPRESSION: Cardiomegaly and mild pulmonary vascular  congestion. Electronically Signed   By: San Morelle M.D.   On: 12/09/2020 11:06        The results of significant diagnostics from this hospitalization (including imaging, microbiology, ancillary and laboratory) are listed below for reference.     Microbiology: Recent Results (from the past 240 hour(s))  Resp Panel by RT-PCR (Flu A&B, Covid) Nasopharyngeal Swab     Status: None   Collection Time: 12/09/20  1:54 PM   Specimen: Nasopharyngeal Swab; Nasopharyngeal(NP) swabs in vial transport medium  Result Value Ref Range Status   SARS Coronavirus 2 by RT PCR NEGATIVE NEGATIVE Final    Comment: (NOTE) SARS-CoV-2 target nucleic acids are NOT DETECTED.  The SARS-CoV-2 RNA is generally detectable in upper respiratory specimens during the acute phase of infection. The lowest concentration of SARS-CoV-2 viral copies this assay can detect is 138 copies/mL. A negative result does not preclude SARS-Cov-2 infection and should not be used as the sole basis for treatment or other patient management decisions. A negative result may occur with  improper specimen collection/handling, submission of specimen other than nasopharyngeal swab, presence of viral mutation(s) within the areas targeted by this assay, and inadequate number of viral copies(<138 copies/mL). A negative result must be combined with clinical observations, patient history, and epidemiological information. The expected result is Negative.  Fact Sheet for Patients:  EntrepreneurPulse.com.au  Fact Sheet for Healthcare Providers:  IncredibleEmployment.be  This test is no t yet approved or cleared by the [place] FDA and  has been authorized for detection and/or diagnosis of SARS-CoV-2 by FDA under an Emergency Use Authorization (EUA). This EUA will remain  in effect (meaning this test can be used) for the duration of the COVID-19 declaration under Section 564(b)(1) of the Act,  21 U.S.C.section 360bbb-3(b)(1), unless the authorization is terminated  or revoked sooner.       Influenza A by PCR NEGATIVE NEGATIVE Final   Influenza B by PCR NEGATIVE NEGATIVE Final    Comment: (NOTE) The Xpert Xpress SARS-CoV-2/FLU/RSV plus assay is intended as an aid in the diagnosis of influenza from Nasopharyngeal swab specimens and should not be used as a sole basis for treatment. Nasal washings and aspirates are unacceptable for Xpert Xpress SARS-CoV-2/FLU/RSV testing.  Fact Sheet for Patients: EntrepreneurPulse.com.au  Fact Sheet for Healthcare Providers: IncredibleEmployment.be  This test is not yet approved or cleared by the [place] FDA and has been authorized for detection and/or diagnosis of SARS-CoV-2 by FDA under an Emergency Use Authorization (EUA). This EUA will remain in effect (meaning this test can  be used) for the duration of the COVID-19 declaration under Section 564(b)(1) of the Act, 21 U.S.C. section 360bbb-3(b)(1), unless the authorization is terminated or revoked.  Performed at [facility], [place] [address] [place], [place] [address]      Labs:  CBC: Recent Labs  Lab 12/09/20 1212 12/10/20 0337 12/11/20 0559  WBC 2.8* 2.0* 2.2*  NEUTROABS 2.2  --  0.9*  HGB 11.0* 10.1* 11.9*  HCT 35.5* 31.2* 37.4*  MCV 105.0* 101.3* 101.9*  PLT 96* 93* 117*   BMP &GFR Recent Labs  Lab 12/09/20 1212 12/10/20 0337 12/11/20 0559  NA 138 137 141  K 6.7* 3.7 3.4*  CL 97* 93* 97*  CO2 19* 29 33*  GLUCOSE 146* 105* 101*  BUN 135* 57* 24*  CREATININE 20.33* 11.44* 8.17*  CALCIUM 8.9 9.0 9.0  MG  --   --  2.2  PHOS  --   --  5.0*   Estimated Creatinine Clearance: 8.5 mL/min (A) (by C-G formula based on SCr of 8.17 mg/dL (H)). Liver & Pancreas: Recent Labs  Lab 12/11/20 0559  ALBUMIN 3.0*   No results for input(s): LIPASE, AMYLASE in the last 168 hours. No results for input(s): AMMONIA in the  last 168 hours. Diabetic: No results for input(s): HGBA1C in the last 72 hours. Recent Labs  Lab 12/10/20 0854 12/10/20 1648 12/10/20 1918 12/11/20 0314 12/11/20 1114  GLUCAP 113* 117* 123* 116* 127*   Cardiac Enzymes: No results for input(s): CKTOTAL, CKMB, CKMBINDEX, TROPONINI in the last 168 hours. No results for input(s): PROBNP in the last 8760 hours. Coagulation Profile: No results for input(s): INR, PROTIME in the last 168 hours. Thyroid Function Tests: Recent Labs    12/09/20 1807  TSH 2.734   Lipid Profile: No results for input(s): CHOL, HDL, LDLCALC, TRIG, CHOLHDL, LDLDIRECT in the last 72 hours. Anemia Panel: Recent Labs    12/11/20 0559  VITAMINB12 1,284*  FOLATE 12.1  FERRITIN 125  TIBC 330  IRON 81  RETICCTPCT 1.7   Urine analysis:    Component Value Date/Time   COLORURINE YELLOW 05/08/2010 [place] 05/08/2010 1335   LABSPEC 1.009 05/08/2010 1335   PHURINE 6.0 05/08/2010 1335   GLUCOSEU NEGATIVE 05/08/2010 1335   HGBUR NEGATIVE 05/08/2010 1335   BILIRUBINUR NEGATIVE 05/08/2010 1335   KETONESUR NEGATIVE 05/08/2010 1335   PROTEINUR 100 (A) 05/08/2010 1335   UROBILINOGEN 0.2 05/08/2010 1335   NITRITE NEGATIVE 05/08/2010 1335   LEUKOCYTESUR SMALL (A) 05/08/2010 1335   Sepsis Labs: Invalid input(s): PROCALCITONIN, LACTICIDVEN   Time coordinating discharge: 40 minutes  SIGNED:  Mercy Riding, MD  [facility] 12/11/2020, 10:42 PM  If 7PM-7AM, please contact night-coverage [email or web address]

## 2020-12-12 ENCOUNTER — Telehealth: Payer: Self-pay | Admitting: Nephrology

## 2020-12-12 NOTE — Telephone Encounter (Signed)
Transition of care contact from inpatient facility  Date of Discharge: 12/11/20 Date of Contact: 12/12/20 Method of contact: Phone  Attempted to contact patient to discuss transition of care from inpatient admission. Patient did not answer the phone. Unable to leave VM. Mailbox full.

## 2020-12-25 DIAGNOSIS — B182 Chronic viral hepatitis C: Secondary | ICD-10-CM | POA: Insufficient documentation

## 2021-01-13 ENCOUNTER — Other Ambulatory Visit (HOSPITAL_COMMUNITY): Payer: Self-pay

## 2021-01-19 ENCOUNTER — Emergency Department (HOSPITAL_COMMUNITY): Payer: 59

## 2021-01-19 ENCOUNTER — Observation Stay (HOSPITAL_COMMUNITY)
Admission: EM | Admit: 2021-01-19 | Discharge: 2021-01-20 | Disposition: A | Discharge status: Home / Self Care | Payer: 59 | Attending: Student | Admitting: Student

## 2021-01-19 DIAGNOSIS — E162 Hypoglycemia, unspecified: Secondary | ICD-10-CM | POA: Diagnosis present

## 2021-01-19 DIAGNOSIS — N186 End stage renal disease: Secondary | ICD-10-CM | POA: Insufficient documentation

## 2021-01-19 DIAGNOSIS — F1910 Other psychoactive substance abuse, uncomplicated: Secondary | ICD-10-CM | POA: Diagnosis not present

## 2021-01-19 DIAGNOSIS — Z7902 Long term (current) use of antithrombotics/antiplatelets: Secondary | ICD-10-CM | POA: Diagnosis not present

## 2021-01-19 DIAGNOSIS — I480 Paroxysmal atrial fibrillation: Secondary | ICD-10-CM | POA: Diagnosis not present

## 2021-01-19 DIAGNOSIS — R4182 Altered mental status, unspecified: Secondary | ICD-10-CM | POA: Insufficient documentation

## 2021-01-19 DIAGNOSIS — R5383 Other fatigue: Secondary | ICD-10-CM

## 2021-01-19 DIAGNOSIS — I132 Hypertensive heart and chronic kidney disease with heart failure and with stage 5 chronic kidney disease, or end stage renal disease: Secondary | ICD-10-CM | POA: Diagnosis not present

## 2021-01-19 DIAGNOSIS — G9341 Metabolic encephalopathy: Principal | ICD-10-CM | POA: Insufficient documentation

## 2021-01-19 DIAGNOSIS — I2510 Atherosclerotic heart disease of native coronary artery without angina pectoris: Secondary | ICD-10-CM | POA: Diagnosis not present

## 2021-01-19 DIAGNOSIS — Z8616 Personal history of COVID-19: Secondary | ICD-10-CM | POA: Diagnosis not present

## 2021-01-19 DIAGNOSIS — Z79899 Other long term (current) drug therapy: Secondary | ICD-10-CM | POA: Diagnosis not present

## 2021-01-19 DIAGNOSIS — Z7901 Long term (current) use of anticoagulants: Secondary | ICD-10-CM | POA: Insufficient documentation

## 2021-01-19 DIAGNOSIS — J449 Chronic obstructive pulmonary disease, unspecified: Secondary | ICD-10-CM | POA: Diagnosis not present

## 2021-01-19 DIAGNOSIS — R778 Other specified abnormalities of plasma proteins: Secondary | ICD-10-CM | POA: Diagnosis not present

## 2021-01-19 DIAGNOSIS — I5032 Chronic diastolic (congestive) heart failure: Secondary | ICD-10-CM | POA: Diagnosis not present

## 2021-01-19 DIAGNOSIS — F17210 Nicotine dependence, cigarettes, uncomplicated: Secondary | ICD-10-CM | POA: Diagnosis not present

## 2021-01-19 DIAGNOSIS — H5702 Anisocoria: Secondary | ICD-10-CM

## 2021-01-19 DIAGNOSIS — Z992 Dependence on renal dialysis: Secondary | ICD-10-CM | POA: Insufficient documentation

## 2021-01-19 DIAGNOSIS — Z20822 Contact with and (suspected) exposure to covid-19: Secondary | ICD-10-CM | POA: Insufficient documentation

## 2021-01-19 LAB — CBC WITH DIFFERENTIAL/PLATELET
Abs Immature Granulocytes: 0.02 10*3/uL (ref 0.00–0.07)
Basophils Absolute: 0 10*3/uL (ref 0.0–0.1)
Basophils Relative: 1 %
Eosinophils Absolute: 0 10*3/uL (ref 0.0–0.5)
Eosinophils Relative: 1 %
HCT: 24 % — ABNORMAL LOW (ref 39.0–52.0)
Hemoglobin: 7.3 g/dL — ABNORMAL LOW (ref 13.0–17.0)
Immature Granulocytes: 1 %
Lymphocytes Relative: 18 %
Lymphs Abs: 0.3 10*3/uL — ABNORMAL LOW (ref 0.7–4.0)
MCH: 32.7 pg (ref 26.0–34.0)
MCHC: 30.4 g/dL (ref 30.0–36.0)
MCV: 107.6 fL — ABNORMAL HIGH (ref 80.0–100.0)
Monocytes Absolute: 0.4 10*3/uL (ref 0.1–1.0)
Monocytes Relative: 24 %
Neutro Abs: 1 10*3/uL — ABNORMAL LOW (ref 1.7–7.7)
Neutrophils Relative %: 55 %
Platelets: 83 10*3/uL — ABNORMAL LOW (ref 150–400)
RBC: 2.23 MIL/uL — ABNORMAL LOW (ref 4.22–5.81)
RDW: 17.3 % — ABNORMAL HIGH (ref 11.5–15.5)
WBC: 1.8 10*3/uL — ABNORMAL LOW (ref 4.0–10.5)
nRBC: 0 % (ref 0.0–0.2)

## 2021-01-19 LAB — CBG MONITORING, ED
Glucose-Capillary: 133 mg/dL — ABNORMAL HIGH (ref 70–99)
Glucose-Capillary: 92 mg/dL (ref 70–99)
Glucose-Capillary: 73 mg/dL (ref 70–99)
Glucose-Capillary: 65 mg/dL — ABNORMAL LOW (ref 70–99)
Glucose-Capillary: 101 mg/dL — ABNORMAL HIGH (ref 70–99)
Glucose-Capillary: 103 mg/dL — ABNORMAL HIGH (ref 70–99)

## 2021-01-19 LAB — TROPONIN I (HIGH SENSITIVITY)
Troponin I (High Sensitivity): 31 ng/L — ABNORMAL HIGH (ref <18)
Troponin I (High Sensitivity): 28 ng/L — ABNORMAL HIGH (ref <18)

## 2021-01-19 LAB — COMPREHENSIVE METABOLIC PANEL
ALT: 8 U/L (ref 0–44)
AST: 12 U/L — ABNORMAL LOW (ref 15–41)
Albumin: 3.2 g/dL — ABNORMAL LOW (ref 3.5–5.0)
Alkaline Phosphatase: 52 U/L (ref 38–126)
Anion gap: 10 (ref 5–15)
BUN: 54 mg/dL — ABNORMAL HIGH (ref 8–23)
CO2: 30 mmol/L (ref 22–32)
Calcium: 7.8 mg/dL — ABNORMAL LOW (ref 8.9–10.3)
Chloride: 95 mmol/L — ABNORMAL LOW (ref 98–111)
Creatinine, Ser: 7.49 mg/dL — ABNORMAL HIGH (ref 0.61–1.24)
GFR, Estimated: 7 mL/min — ABNORMAL LOW (ref >60)
Glucose, Bld: 127 mg/dL — ABNORMAL HIGH (ref 70–99)
Potassium: 3.8 mmol/L (ref 3.5–5.1)
Sodium: 135 mmol/L (ref 135–145)
Total Bilirubin: 0.9 mg/dL (ref 0.3–1.2)
Total Protein: 7.1 g/dL (ref 6.5–8.1)

## 2021-01-19 LAB — AMMONIA: Ammonia: 34 umol/L (ref 9–35)

## 2021-01-19 IMAGING — CT CT HEAD W/O CM
4 series · 15 of 47 positions shown, 17 images · non-contrast
Comparison: [DATE]

CLINICAL DATA: Delirium

EXAM:
CT HEAD WITHOUT CONTRAST
TECHNIQUE: Contiguous axial images were obtained from the base of the skull
through the vertex without intravenous contrast.

[Series 3: head without · axial · non-contrast · 0.50mm/px · z∈[-147,-27]mm · 7 of 33 slices shown, 9 images]
[im 5/33  brain]
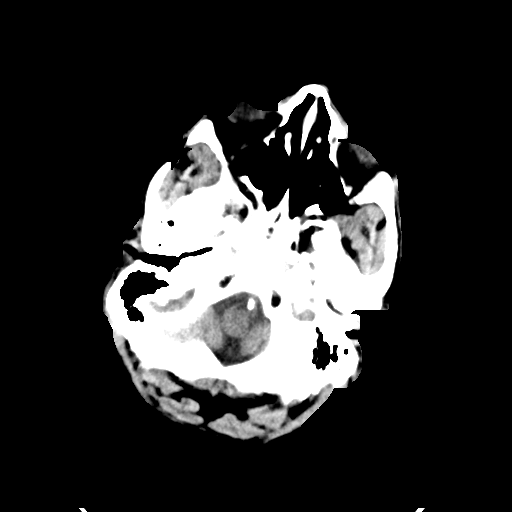
[im 5/33  bone]
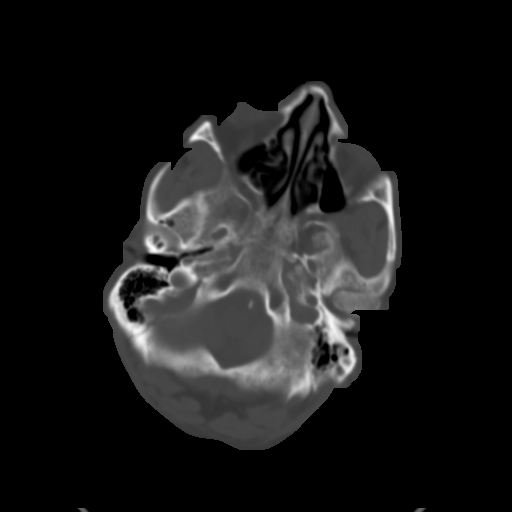
[im 9/33  brain]
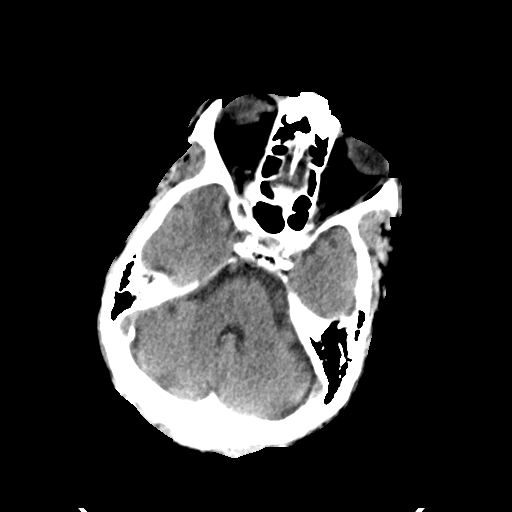
[im 13/33  brain]
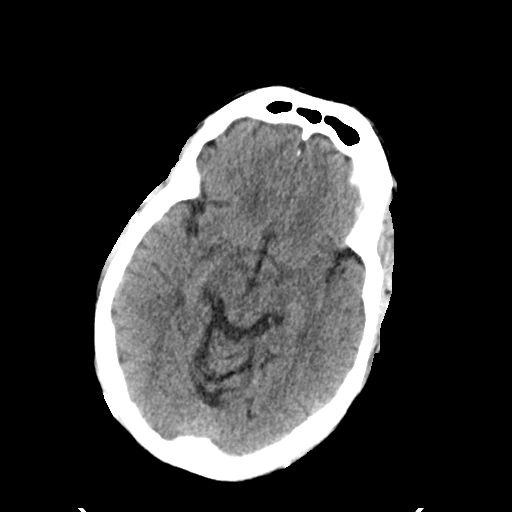
[im 17/33  brain]
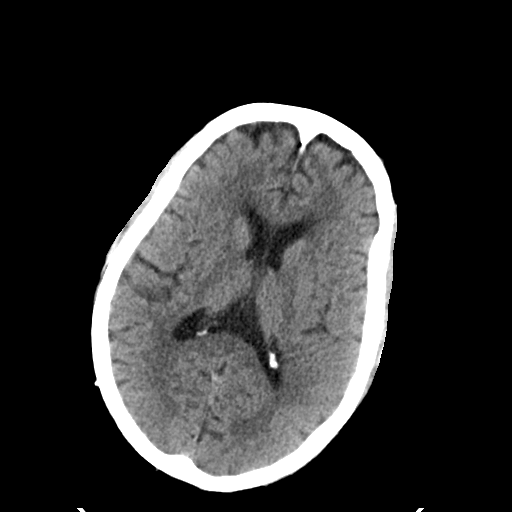
[im 21/33  brain]
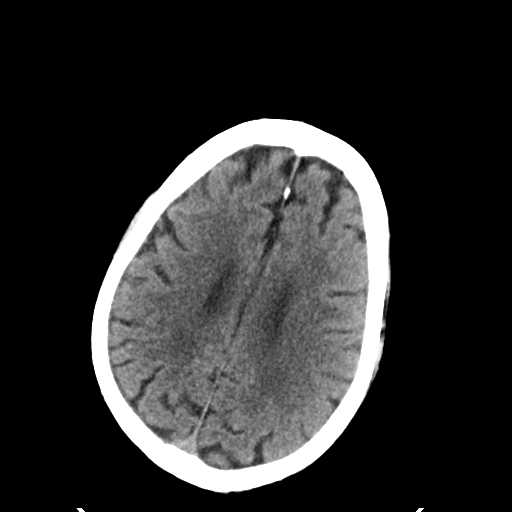
[im 21/33  bone]
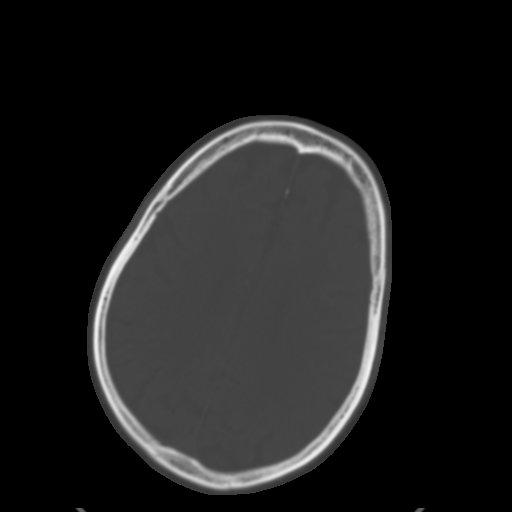
[im 25/33  brain]
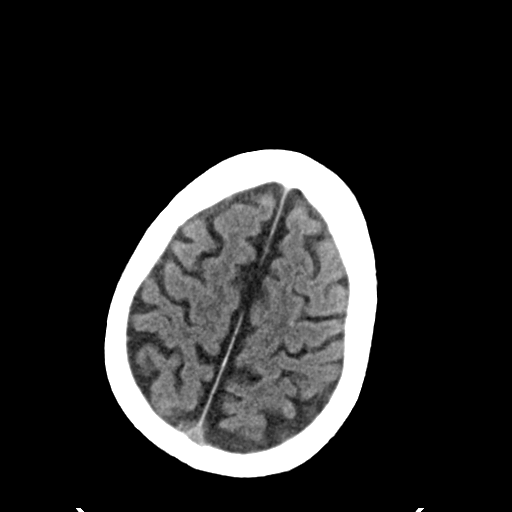
[im 29/33  brain]
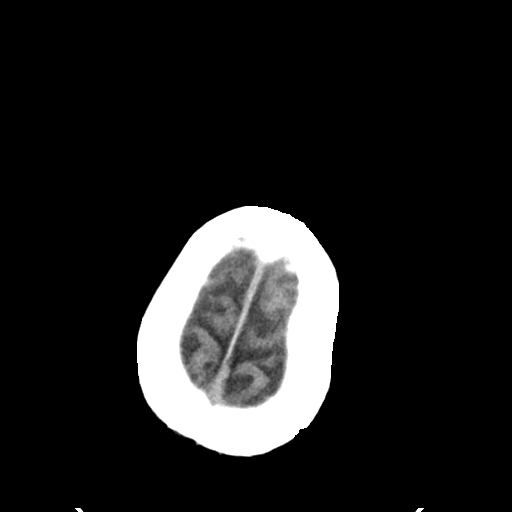

[Series 4: head bone · axial · 0.50mm/px · z∈[-151,-135]mm · 2 of 81 slices shown]
[im 9/81  bone]
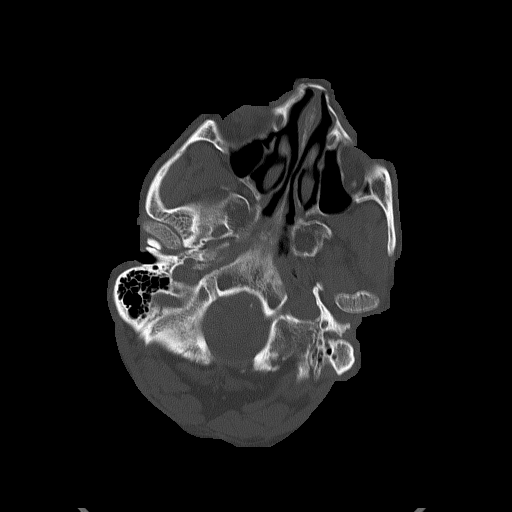
[im 17/81  bone]
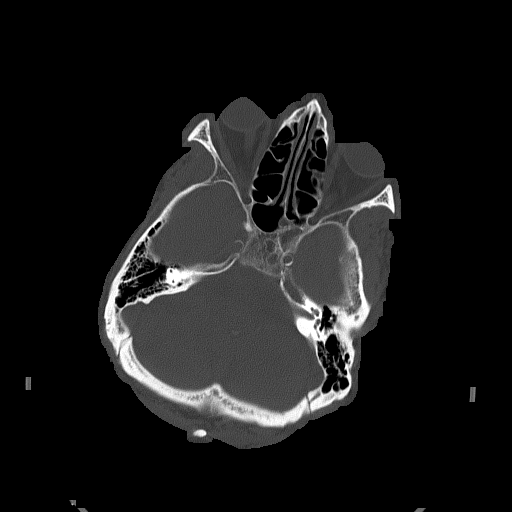

[Series 5: head without cor · coronal · non-contrast · 0.34mm/px · 3 of 76 slices shown]
[im 26/76  brain]
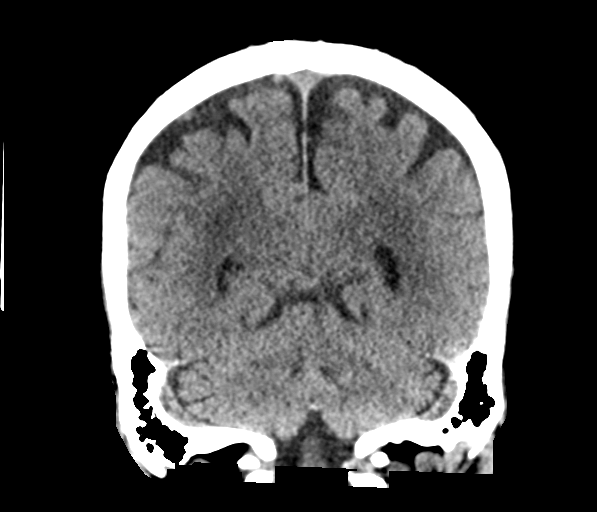
[im 34/76  brain]
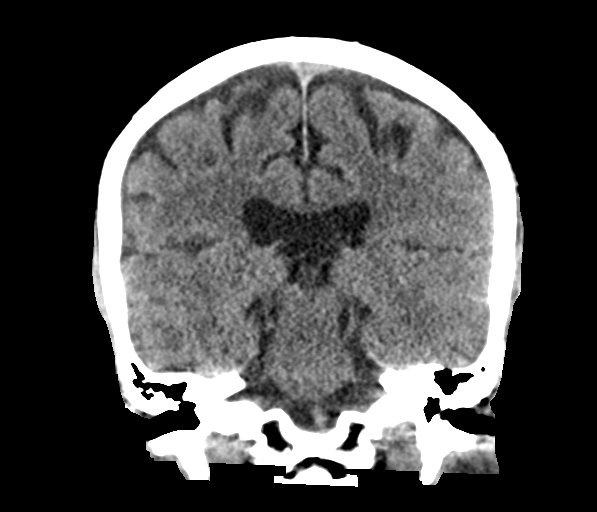
[im 42/76  brain]
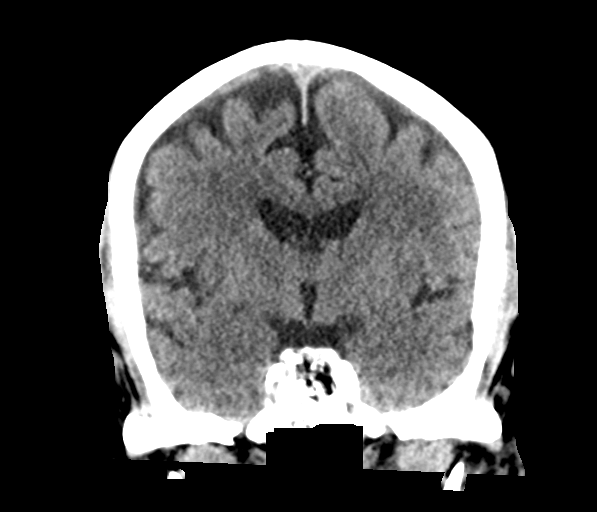

[Series 6: head without sag · sagittal · non-contrast · 0.31mm/px · 3 of 63 slices shown]
[im 21/63  brain]
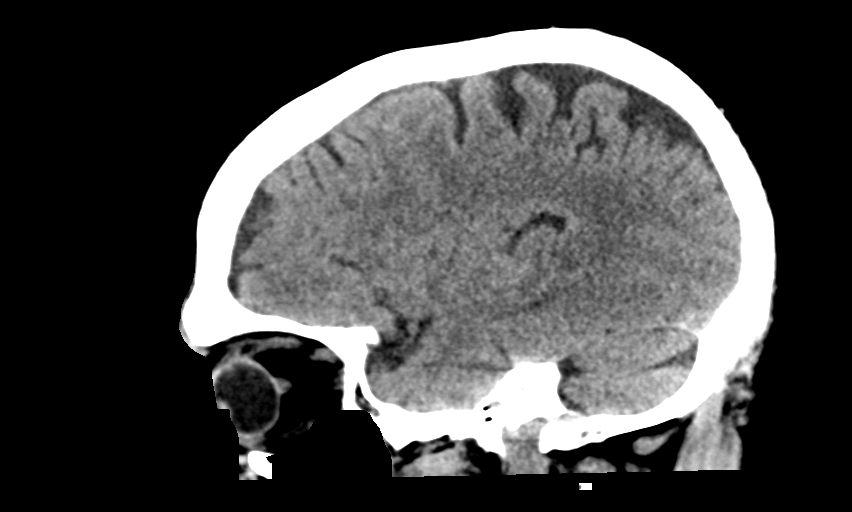
[im 32/63  brain]
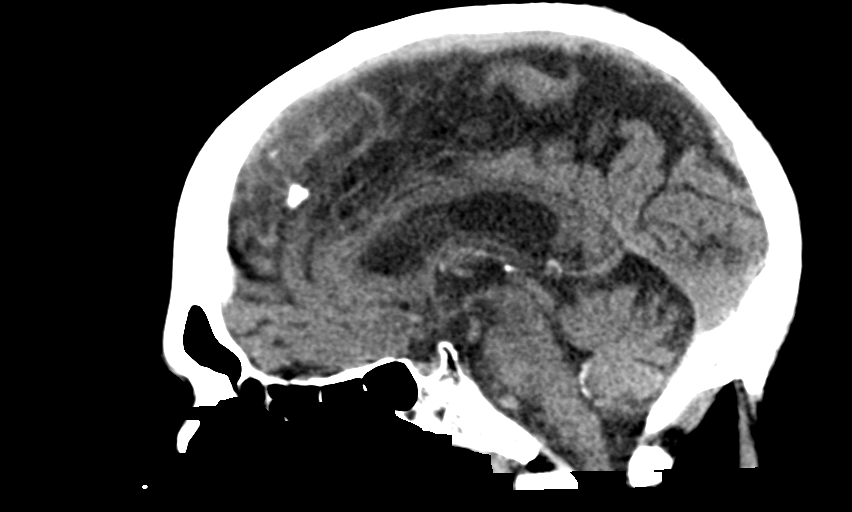
[im 42/63  brain]
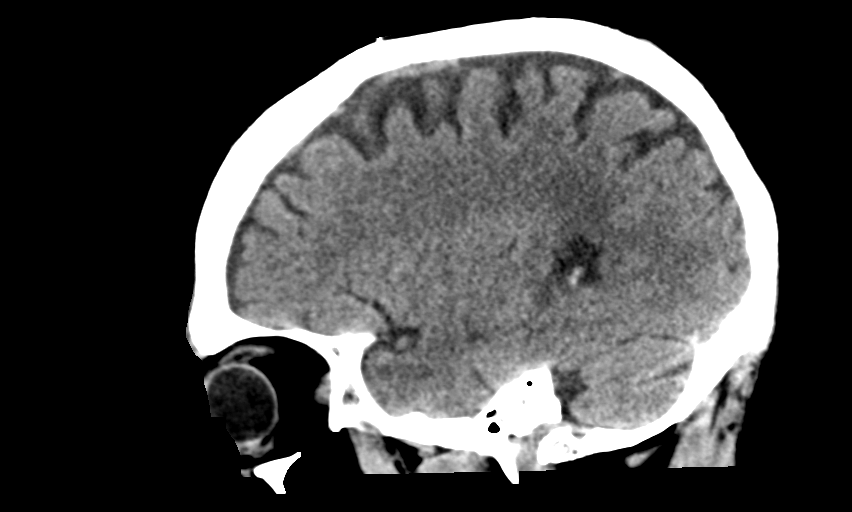

[15 of 47 positions shown; findings below may reference images not displayed]

FINDINGS: Brain: Mild age related global parenchymal volume loss. Stable mild
burden of chronic ischemic white matter disease. No evidence of
acute large vascular territory infarction, hemorrhage,
hydrocephalus, extra-axial collection or mass lesion/mass effect.

Vascular: No hyperdense vessel. Atherosclerotic calcifications of
the internal carotid and vertebral arteries at the skull base.

Skull: Normal. Negative for fracture or focal lesion.

Sinuses/Orbits: Visualized portions of the paranasal sinuses and
mastoid air cells are predominantly clear. Orbits are grossly
unremarkable.

Other: None
IMPRESSION: 1. No acute intracranial findings.
2. Stable mild age related global parenchymal volume loss and
chronic ischemic white matter disease.

## 2021-01-19 IMAGING — DX DG CHEST 1V PORT
1 series · 1 of 1 positions shown · non-contrast
Comparison: [DATE]

CLINICAL DATA: Pt states no chest complaints, but seems altered,
see notes below: Pt comes from dialysis via EMS. Pt got 3.5 hrs of 4
hrs tx with 3.2 L removed. Staff stopped tx when pt became altered.
RIAZ was 35. 25g D50 via dialysis port aces.*comment was
truncated*sob

EXAM:
PORTABLE CHEST 1 VIEW

[chest ap]
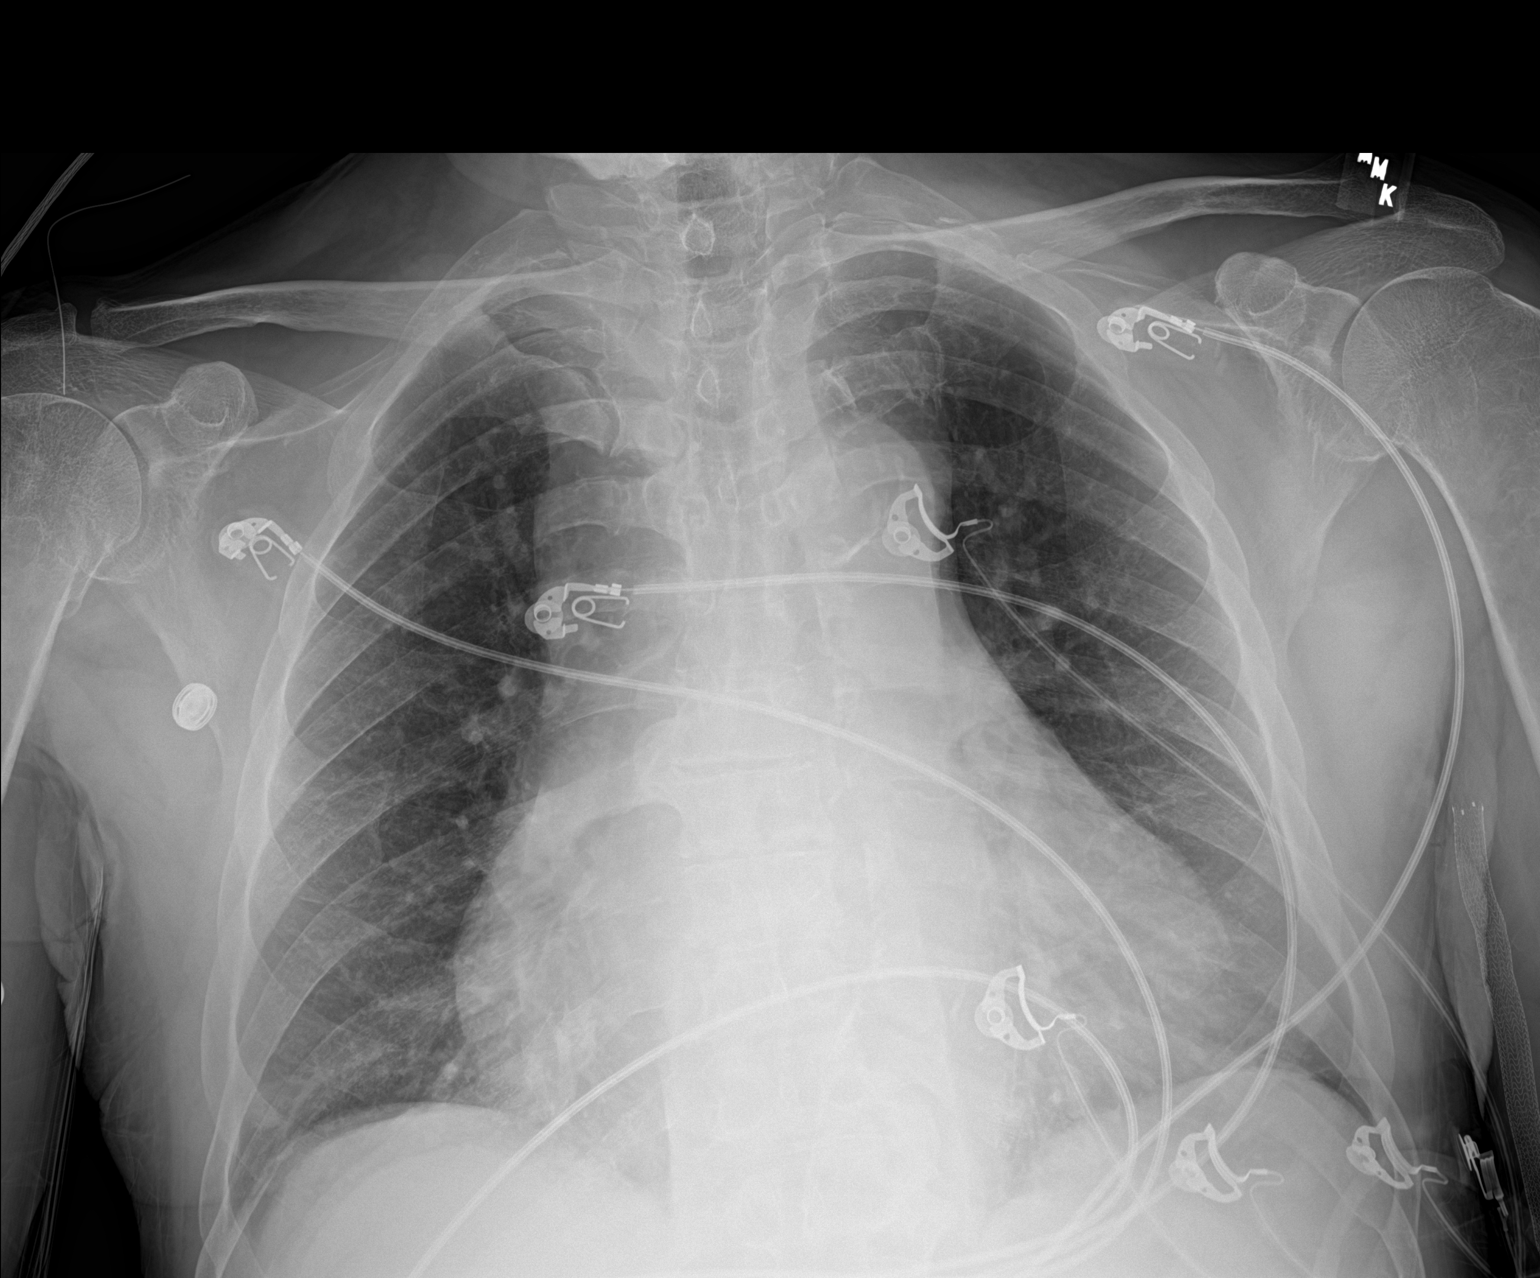

[1 of 1 positions shown; findings below may reference images not displayed]

FINDINGS: Stable large cardiac silhouette. No pulmonary edema pleural fluid.
No focal consolidation no pneumothorax.
IMPRESSION: Cardiomegaly.  No evidence of pulmonary edema.

## 2021-01-19 IMAGING — MR MR HEAD W/O CM
10 of 11 series · 43 of 48 positions shown · non-contrast
Comparison: None.

CLINICAL DATA: Encephalopathy

EXAM:
MRI HEAD WITHOUT CONTRAST
TECHNIQUE: Multiplanar, multiecho pulse sequences of the brain and surrounding
structures were obtained without intravenous contrast.

[Series 5: DWI · axial · 3.0mm · 0.92mm/px · z∈[-89,+61]mm · 9 of 104 slices shown (1 of 4)]
[im 1/104]
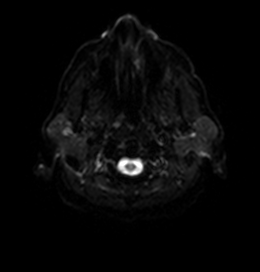
[im 13/104]
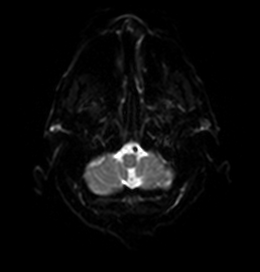
[im 26/104]
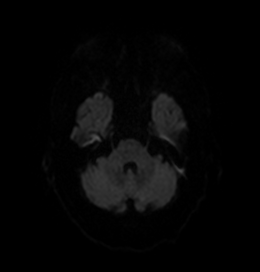
[im 39/104]
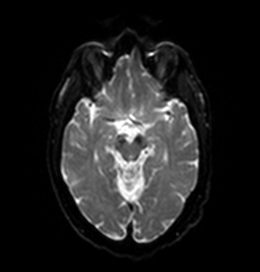
[im 52/104]
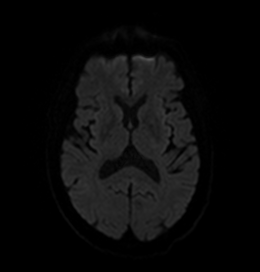
[im 65/104]
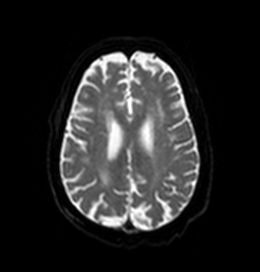
[im 78/104]
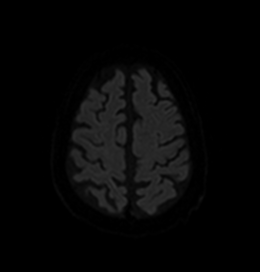
[im 91/104]
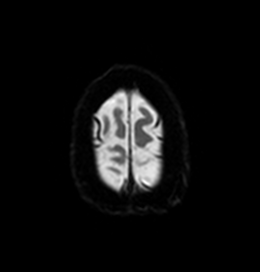
[im 104/104]
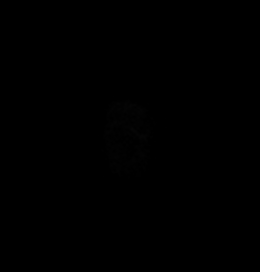

[Series 6: DWI · axial · 3.0mm · 0.92mm/px · z∈[-89,+61]mm · 5 of 52 slices shown (2 of 4)]
[im 1/52]
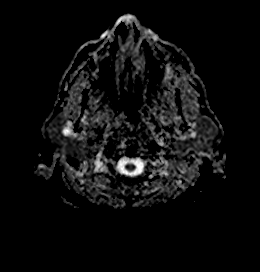
[im 13/52]
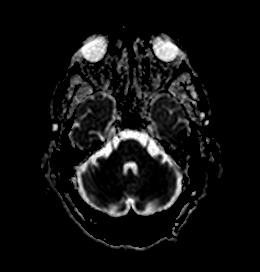
[im 26/52]
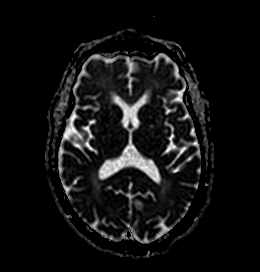
[im 39/52]
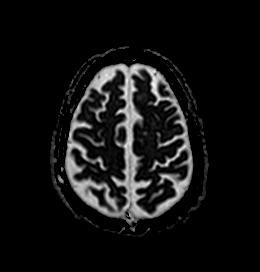
[im 52/52]
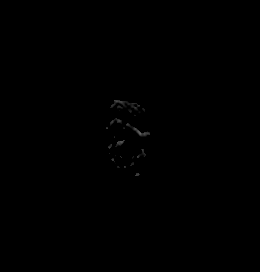

[Series 7: DWI · coronal · 4.0mm · 0.88mm/px · 7 of 76 slices shown (3 of 4)]
[im 1/76]
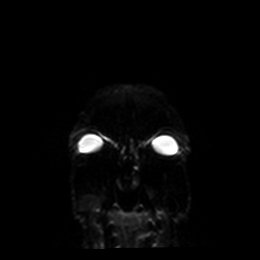
[im 13/76]
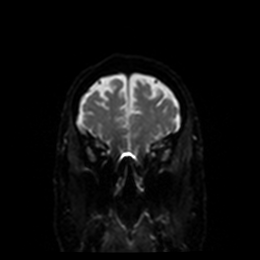
[im 26/76]
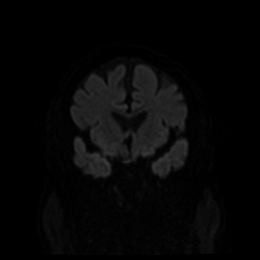
[im 38/76]
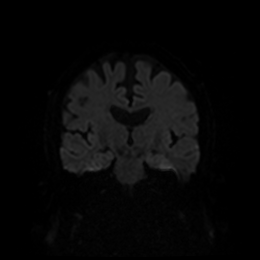
[im 51/76]
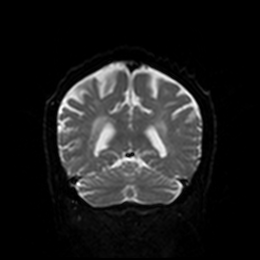
[im 63/76]
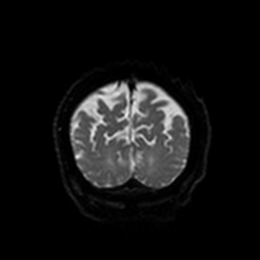
[im 76/76]
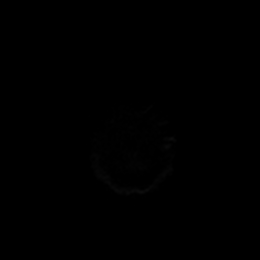

[Series 8: DWI · coronal · 4.0mm · 0.88mm/px · 3 of 38 slices shown (4 of 4)]
[im 1/38]
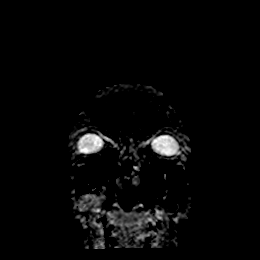
[im 19/38]
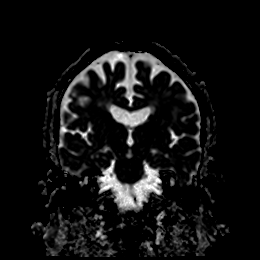
[im 38/38]
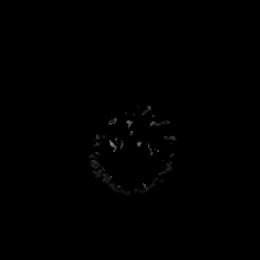

[Series 9: FLAIR · axial · 5.0mm · 0.47mm/px · z∈[-85,+56]mm · 2 of 25 slices shown]
[im 1/25]
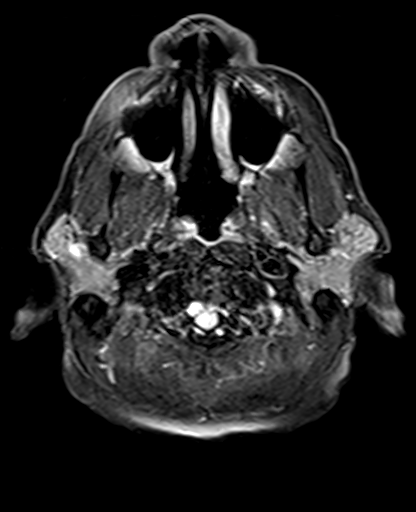
[im 25/25]
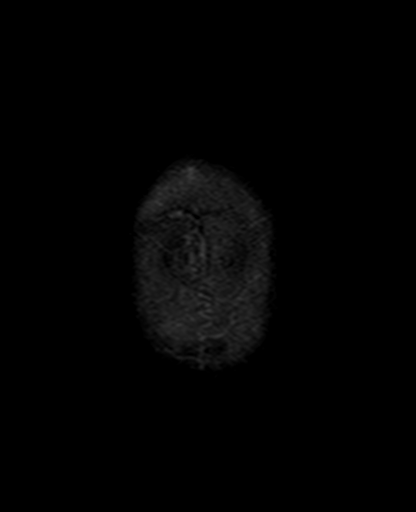

[Series 11: pha_images · axial · 3.0mm · 0.94mm/px · z∈[-87,+60]mm · 5 of 51 slices shown]
[im 1/51]
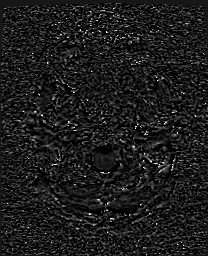
[im 13/51]
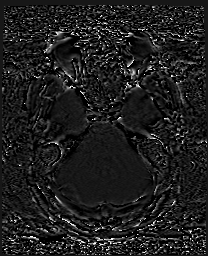
[im 26/51]
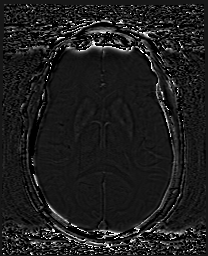
[im 38/51]
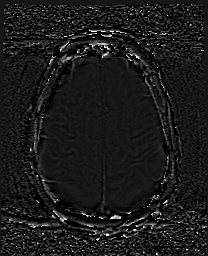
[im 51/51]
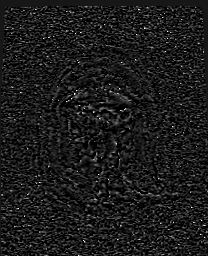

[Series 12: swi_images · axial · 3.0mm · 0.94mm/px · z∈[-90,+60]mm · 5 of 52 slices shown]
[im 1/52]
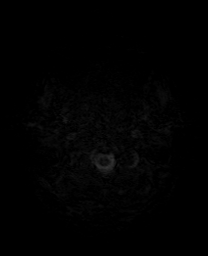
[im 13/52]
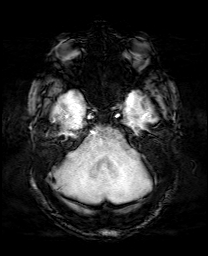
[im 26/52]
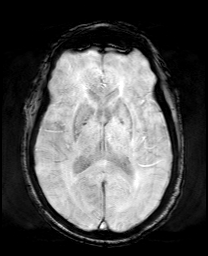
[im 39/52]
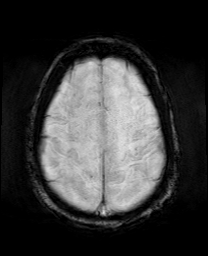
[im 52/52]
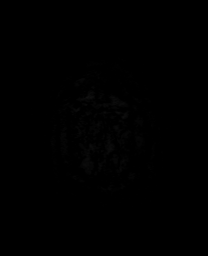

[Series 14: T1 · sagittal · 5.0mm · 0.75mm/px · 2 of 25 slices shown]
[im 1/25]
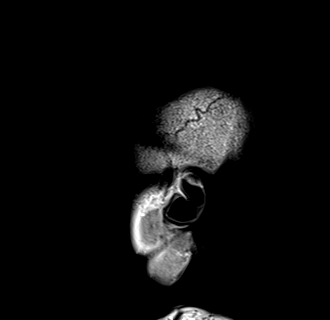
[im 25/25]
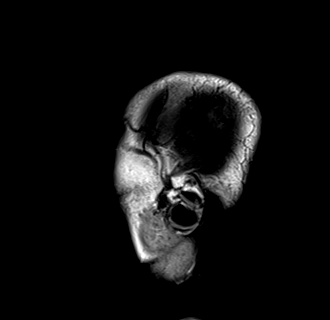

[Series 15: T2 · axial · 5.0mm · 0.72mm/px · z∈[-83,+58]mm · 2 of 25 slices shown (1 of 2)]
[im 1/25]
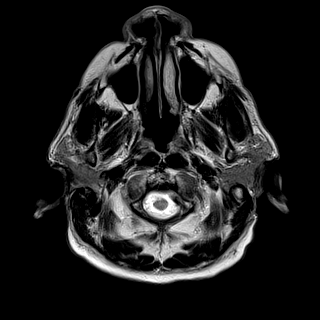
[im 25/25]
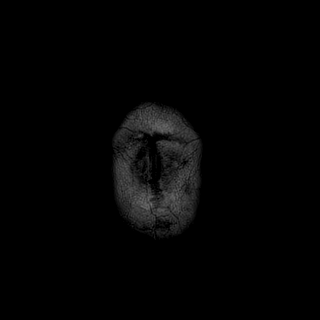

[Series 17: T2 · coronal · 5.0mm · 0.34mm/px · 3 of 31 slices shown (2 of 2)]
[im 1/31]
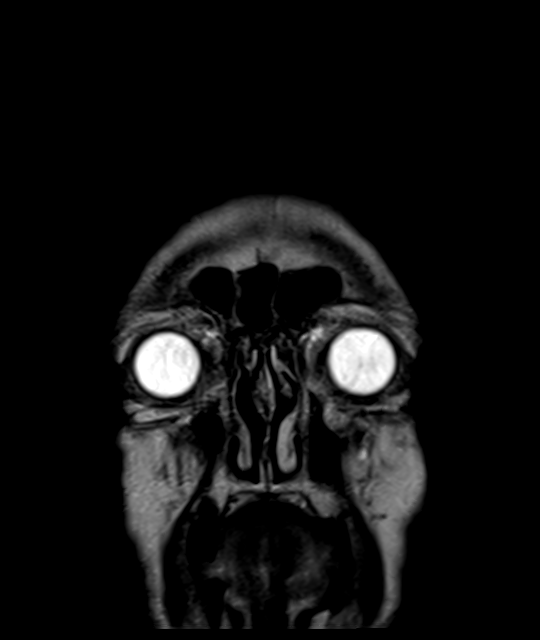
[im 16/31]
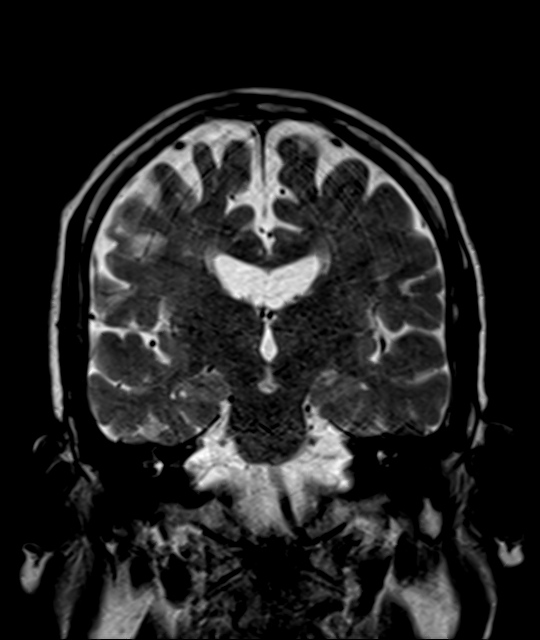
[im 31/31]
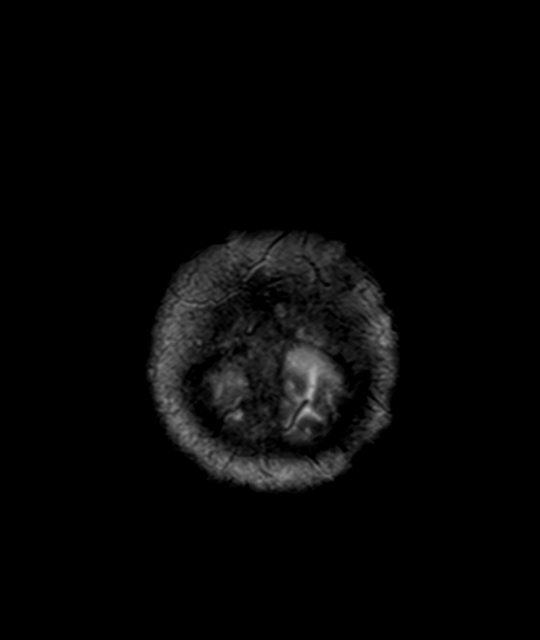

[43 of 48 positions shown; findings below may reference images not displayed]

FINDINGS: Brain: No acute infarct, mass effect or extra-axial collection. No
acute or chronic hemorrhage. Hyperintense T2-weighted signal is
moderately widespread throughout the white matter. Generalized
volume loss without a clear lobar predilection. The midline
structures are normal.

Vascular: Major flow voids are preserved.

Skull and upper cervical spine: Normal calvarium and skull base.
Visualized upper cervical spine and soft tissues are normal.

Sinuses/Orbits:No paranasal sinus fluid levels or advanced mucosal
thickening. No mastoid or middle ear effusion. Normal orbits.
IMPRESSION: 1. No acute intracranial abnormality.
2. Generalized volume loss and findings of chronic microvascular
ischemia.

## 2021-01-19 MED ORDER — DEXTROSE 10 % IV SOLN: INTRAVENOUS | Status: DC

## 2021-01-19 MED ORDER — HEPARIN SODIUM (PORCINE) 5000 UNIT/ML IJ SOLN
5000.0000 [IU] | Freq: Three times a day (TID) | INTRAMUSCULAR | Status: DC
  Administered 2021-01-19 – 2021-01-20 (×3): 5000 [IU] via SUBCUTANEOUS
  Filled 2021-01-19 (×3): qty 1

## 2021-01-19 MED ORDER — THIAMINE HCL 100 MG PO TABS
100.0000 mg | ORAL_TABLET | Freq: Every day | ORAL | Status: DC
  Administered 2021-01-20: 100 mg via ORAL
  Filled 2021-01-19: qty 1

## 2021-01-19 MED ORDER — METOPROLOL TARTRATE 5 MG/5ML IV SOLN: 2.5000 mg | Freq: Four times a day (QID) | INTRAVENOUS | Status: DC | PRN

## 2021-01-19 MED ORDER — DEXTROSE 50 % IV SOLN
1.0000 | Freq: Once | INTRAVENOUS | Status: AC
  Administered 2021-01-19: 50 mL via INTRAVENOUS
  Filled 2021-01-19: qty 50

## 2021-01-19 MED ORDER — PANTOPRAZOLE SODIUM 40 MG PO TBEC
40.0000 mg | DELAYED_RELEASE_TABLET | Freq: Every day | ORAL | Status: DC
  Administered 2021-01-20: 40 mg via ORAL
  Filled 2021-01-19: qty 1

## 2021-01-19 NOTE — ED Notes (Signed)
Pt given orange juice.

## 2021-01-19 NOTE — ED Provider Notes (Signed)
Raymond [name]   CSN: [id number] Arrival date & time: 01/19/21  1708     History Chief Complaint  Patient presents with   Hypoglycemia   Altered Mental Status    Raymond [name] is a 65 y.o. male.ESRD on HD TTS, CAD, PAF not on AC due to GIB/hematuria, alcohol abuse, tobacco use disorder, hep C s/p treatment, PVD and COPD  Level 5 caveat history limited due to altered mental status.  Patient was at dialysis when staff noted patient to be, altered.  Received majority of treatment.  Glucose 35.  Given D50 and glucose improved but still lethargic.  Patient denies any acute complaints, does answer basic questions asked.  HPI     Past Medical History:  Diagnosis Date   Anemia    CAD (coronary artery disease)    a. 03/2018: cath showing 91 to 30% stenosis along the LAD, luminal irregularities along the RCA, and angiographically normal LCx   COPD (chronic obstructive pulmonary disease) (HCC)    ESRD (end stage renal disease) on dialysis [facility])    "MWF; Jeneen Rinks" (07/22/17)   Hepatitis C    Still positive s/p liver biopsy at [facility]  and interferon therapy for 6 months. Most recent lab work was on 10/24/12   Hepatitis C    "took the tx; gone now" (12/05/2016)  Was treated   History of blood transfusion ~ 2012/2013   "related to my kidneys; blood was low"   Hypertension    Peripheral arterial disease ([place])    Substance abuse ([place])    Thyroid disease     Patient Active Problem List   Diagnosis Date Noted   AMS (altered mental status) 01/19/2021   Acute upper GI bleed 40/98/1191   Acute metabolic encephalopathy 47/82/9562   COVID-19 virus infection 07/29/2020   Gastric AVM    AVM (arteriovenous malformation) of small bowel, acquired with hemorrhage    Current use of long term anticoagulation    History of arteriovenous malformation (AVM) 01/14/2020   UGIB (upper gastrointestinal bleed) 01/14/2020   Acute blood loss anemia  01/14/2020   Elevated troponin 01/14/2020   Gastroenteritis 12/24/2018   Substance use disorder 12/24/2018   Noncompliance of patient with renal dialysis ([place]) 09/12/2018   Benign neoplasm of colon    Melena    Anemia due to chronic kidney disease, on chronic dialysis ([place])    Acute on chronic anemia    H/O medication noncompliance    Polysubstance (excluding opioids) dependence ([place])    Atrial flutter with rapid ventricular response ([place]) 08/10/2018   PAF (paroxysmal atrial fibrillation) ([place]) 08/01/2018   Acute respiratory failure with hypoxia ([place]) 07/13/2018   Non-compliance with renal dialysis ([place]) 07/13/2018   PAD (peripheral artery disease) ([place]) 04/11/2018   Sepsis ([place]) 03/31/2018   Preop cardiovascular exam 03/29/2018   Volume overload 08/07/2017   Acute pulmonary edema (HCC)    Dyspnea 06/20/2017   Acute bronchitis    COPD (chronic obstructive pulmonary disease) ([place])    End-stage renal disease on hemodialysis ([place])    Hypoxia 12/05/2016   Hyperkalemia 12/19/2015   Hypoglycemia 12/19/2015   Polysubstance abuse ([place]) 12/19/2015   Homeless 12/19/2015   Anemia associated with stage 5 chronic renal failure ([place]) 12/19/2015   Atypical chest pain 12/19/2013   Atherosclerosis of native arteries of extremity with intermittent claudication ([place]) 12/04/2012   ESRD (end stage renal disease) ([place]) 12/04/2012   HEPATITIS C 09/07/2006   HYPERCHOLESTEROLEMIA 09/07/2006  HYPERTENSION, BENIGN SYSTEMIC 09/07/2006    Past Surgical History:  Procedure Laterality Date   ABDOMINAL AORTOGRAM W/LOWER EXTREMITY N/A 02/08/2018   Procedure: ABDOMINAL AORTOGRAM W/LOWER EXTREMITY;  Surgeon: Conrad St. Cloud, MD;  Location: [place] [facility];  Service: Cardiovascular;  Laterality: N/A;   ABDOMINAL AORTOGRAM W/LOWER EXTREMITY Bilateral 06/15/2020   Procedure: ABDOMINAL AORTOGRAM W/LOWER EXTREMITY;  Surgeon: Waynetta Sandy, MD;  Location: [place] [facility];  Service: Cardiovascular;   Laterality: Bilateral;   AV FISTULA PLACEMENT Left Aug. 2013 ?   BIOPSY  01/17/2020   Procedure: BIOPSY;  Surgeon: Jackquline Denmark, MD;  Location: [place];  Service: Endoscopy;;   COLONOSCOPY WITH PROPOFOL N/A 08/21/2018   Procedure: COLONOSCOPY WITH PROPOFOL;  Surgeon: Yetta Flock, MD;  Location: [place];  Service: Gastroenterology;  Laterality: N/A;   ENTEROSCOPY N/A 01/17/2020   Procedure: ENTEROSCOPY;  Surgeon: Jackquline Denmark, MD;  Location: [facility] ENDOSCOPY;  Service: Endoscopy;  Laterality: N/A;   ENTEROSCOPY N/A 07/31/2020   Procedure: ENTEROSCOPY;  Surgeon: Doran Stabler, MD;  Location: [place];  Service: Gastroenterology;  Laterality: N/A;   ESOPHAGOGASTRODUODENOSCOPY (EGD) WITH PROPOFOL N/A 08/20/2018   Procedure: ESOPHAGOGASTRODUODENOSCOPY (EGD) WITH PROPOFOL;  Surgeon: Gatha Mayer, MD;  Location: [place];  Service: Endoscopy;  Laterality: N/A;   FEMORAL-POPLITEAL BYPASS GRAFT Left 04/11/2018   Procedure: BYPASS GRAFT FEMORAL-POPLITEAL ARTERY LEFT LEG;  Surgeon: Rosetta Posner, MD;  Location[facility];  Service: Vascular;  Laterality: Left;   GIVENS CAPSULE STUDY N/A 07/31/2020   Procedure: GIVENS CAPSULE STUDY;  Surgeon: Doran Stabler, MD;  Location: [place];  Service: Gastroenterology;  Laterality: N/A;   HOT HEMOSTASIS N/A 08/20/2018   Procedure: HOT HEMOSTASIS (ARGON PLASMA COAGULATION/BICAP);  Surgeon: Gatha Mayer, MD;  Location: [facility] ENDOSCOPY;  Service: Endoscopy;  Laterality: N/A;   HOT HEMOSTASIS N/A 01/17/2020   Procedure: HOT HEMOSTASIS (ARGON PLASMA COAGULATION/BICAP);  Surgeon: Jackquline Denmark, MD;  Location: [facility] ENDOSCOPY;  Service: Endoscopy;  Laterality: N/A;   LEFT HEART CATH AND CORONARY ANGIOGRAPHY N/A 03/29/2018   Procedure: LEFT HEART CATH AND CORONARY ANGIOGRAPHY;  Surgeon: Nelva Bush, MD;  Location: [place] [facility];  Service: Cardiovascular;  Laterality: N/A;   LIVER BIOPSY     ORIF ULNAR FRACTURE Left 05/18/2017   Procedure:  OPEN REDUCTION INTERNAL FIXATION (ORIF) ULNAR FRACTURE;  Surgeon: Iran Planas, MD;  Location: [place];  Service: Orthopedics;  Laterality: Left;   PERIPHERAL VASCULAR INTERVENTION Right 06/15/2020   Procedure: PERIPHERAL VASCULAR INTERVENTION;  Surgeon: Waynetta Sandy, MD;  Location: [place] CV LAB;  Service: Cardiovascular;  Laterality: Right;   POLYPECTOMY  08/21/2018   Procedure: POLYPECTOMY;  Surgeon: Yetta Flock, MD;  Location: [place];  Service: Gastroenterology;;   Earney Mallet N/A 12/11/2012   Procedure: Earney Mallet;  Surgeon: Serafina Mitchell, MD;  Location: [facility] CATH LAB;  Service: Cardiovascular;  Laterality: N/A;   WOUND EXPLORATION Left 06/15/2020   Procedure: GROIN  EXPLORATION;  Surgeon: Waynetta Sandy, MD;  Location: [place];  Service: Vascular;  Laterality: Left;       Family History  Problem Relation Age of Onset   Diabetes Father    Hypertension Father    Heart disease Father     Social History   Tobacco Use   Smoking status: Every Day    Packs/day: 0.50    Years: 25.00    Pack years: 12.50    Types: Cigarettes    Last attempt to quit: 08/01/2011  Years since quitting: 9.4   Smokeless tobacco: Never   Tobacco comments:    he smokes 6 cigarettes a week  Vaping Use   Vaping Use: Never used  Substance Use Topics   Alcohol use: Yes    Comment: occasionally/ one half pint   Drug use: Not Currently    Types: Cocaine    Home Medications Prior to Admission medications   Medication Sig Start Date End Date Taking? Authorizing Provider  acetaminophen (TYLENOL) 325 MG tablet Take 650 mg by mouth every 6 (six) hours as needed for mild pain.    [provider]  albuterol (VENTOLIN HFA) 108 (90 Base) MCG/ACT inhaler Inhale 2 puffs into the lungs every 4 (four) hours as needed for shortness of breath or wheezing. 11/06/19   [provider]  amiodarone (PACERONE) 200 MG tablet Take 1 tablet (200 mg total) by mouth daily.  12/11/20   Mercy Riding, MD  B Complex-C-Folic Acid (RENAL-VITE) 0.8 MG TABS Take 1 tablet by mouth daily.    [provider]  calcitRIOL (ROCALTROL) 0.25 MCG capsule Take 9 capsules (2.25 mcg total) by mouth Every Tuesday,Thursday,and Saturday with dialysis. 12/12/20   Mercy Riding, MD  carvedilol (COREG) 6.25 MG tablet Take 1 tablet (6.25 mg total) by mouth 2 (two) times daily with a meal. 12/11/20   Mercy Riding, MD  cinacalcet (SENSIPAR) 30 MG tablet Take 6 tablets (180 mg total) by mouth Every Tuesday,Thursday,and Saturday with dialysis. 12/12/20   Mercy Riding, MD  clopidogrel (PLAVIX) 75 MG tablet TAKE 1 TABLET (75 MG TOTAL) BY MOUTH DAILY. Patient not taking: Reported on 12/09/2020 06/22/20 06/22/21  Karoline Caldwell, PA-C  docusate sodium (COLACE) 100 MG capsule Take 100 mg by mouth daily.    [provider]  folic acid (FOLVITE) 1 MG tablet Take 1 tablet (1 mg total) by mouth daily. 12/12/20   Mercy Riding, MD  Multiple Vitamin (MULTIVITAMIN WITH MINERALS) TABS tablet Take 1 tablet by mouth daily. 12/12/20   Mercy Riding, MD  nicotine (NICODERM CQ - DOSED IN MG/24 HOURS) 14 mg/24hr patch Place 1 patch (14 mg total) onto the skin daily. 12/12/20   Mercy Riding, MD  pantoprazole (PROTONIX) 40 MG tablet Take 40 mg by mouth daily.    [provider]  prochlorperazine (COMPAZINE) 5 MG tablet Take 1 tablet (5 mg total) by mouth every 8 (eight) hours as needed for nausea or vomiting. 12/11/20   Mercy Riding, MD  rosuvastatin (CRESTOR) 10 MG tablet Take 1 tablet (10 mg total) by mouth daily. 12/11/20 06/09/21  Mercy Riding, MD  sevelamer carbonate (RENVELA) 800 MG tablet Take 4 tablets (3,200 mg total) by mouth 3 (three) times daily with meals. Patient taking differently: Take 4,000 mg by mouth 3 (three) times daily with meals. And take 4000 mg with snacks. 09/14/18   Monica Becton, MD  thiamine 100 MG tablet Take 1 tablet (100 mg total) by mouth daily. 12/12/20   Mercy Riding, MD   apixaban (ELIQUIS) 5 MG TABS tablet Take 1 tablet (5 mg total) by mouth 2 (two) times daily. 06/22/20 08/03/20  Karoline Caldwell, PA-C    Allergies    Patient has no known allergies.  Review of Systems   Review of Systems  Unable to perform ROS: Mental status change   Physical Exam Updated Vital Signs BP 111/70   Pulse 98   Temp 97.9 F (36.6 C) (Oral)   Resp 14  SpO2 100%   Physical Exam Vitals and nursing [name] reviewed.  Constitutional:      Appearance: He is well-developed.  HENT:     Head: Normocephalic and atraumatic.  Eyes:     Conjunctiva/sclera: Conjunctivae normal.  Cardiovascular:     Rate and Rhythm: Normal rate and regular rhythm.     Heart sounds: No murmur heard. Pulmonary:     Effort: Pulmonary effort is normal. No respiratory distress.     Breath sounds: Normal breath sounds.  Abdominal:     Palpations: Abdomen is soft.     Tenderness: There is no abdominal tenderness.  Musculoskeletal:     Cervical back: Neck supple.  Skin:    General: Skin is warm and dry.  Neurological:     Mental Status: He is alert.     Comments: Somewhat lethargic but arousable to sternal rub, answers basic questions, follows commands, normal strength in upper and lower extremities, sensation to light touch intact in lower and upper extremities.  Pupils noted to be unequal, left pupil is 4 mm.,  Right pupil is 2 mm.  Both are reactive.  EOM's intact    ED Results / Procedures / Treatments   Labs (all labs ordered are listed, but only abnormal results are displayed) Labs Reviewed  CBC WITH DIFFERENTIAL/PLATELET - Abnormal; Notable for the following components:      Result Value   WBC 1.8 (*)    RBC 2.23 (*)    Hemoglobin 7.3 (*)    HCT 24.0 (*)    MCV 107.6 (*)    RDW 17.3 (*)    Platelets 83 (*)    Neutro Abs 1.0 (*)    Lymphs Abs 0.3 (*)    All other components within normal limits  COMPREHENSIVE METABOLIC PANEL - Abnormal; Notable for the following components:    Chloride 95 (*)    Glucose, Bld 127 (*)    BUN 54 (*)    Creatinine, Ser 7.49 (*)    Calcium 7.8 (*)    Albumin 3.2 (*)    AST 12 (*)    GFR, Estimated 7 (*)    All other components within normal limits  CBG MONITORING, ED - Abnormal; Notable for the following components:   Glucose-Capillary 133 (*)    All other components within normal limits  CBG MONITORING, ED - Abnormal; Notable for the following components:   Glucose-Capillary 65 (*)    All other components within normal limits  CBG MONITORING, ED - Abnormal; Notable for the following components:   Glucose-Capillary 103 (*)    All other components within normal limits  CBG MONITORING, ED - Abnormal; Notable for the following components:   Glucose-Capillary 101 (*)    All other components within normal limits  TROPONIN I (HIGH SENSITIVITY) - Abnormal; Notable for the following components:   Troponin I (High Sensitivity) 31 (*)    All other components within normal limits  TROPONIN I (HIGH SENSITIVITY) - Abnormal; Notable for the following components:   Troponin I (High Sensitivity) 28 (*)    All other components within normal limits  SARS CORONAVIRUS 2 (TAT 6-24 HRS)  AMMONIA  HEMOGLOBIN A1C  RAPID URINE DRUG SCREEN, HOSP PERFORMED  HIV ANTIBODY (ROUTINE TESTING W REFLEX)  CBG MONITORING, ED  CBG MONITORING, ED  POC OCCULT BLOOD, ED    EKG None  Radiology CT Head Wo Contrast  Result Date: 01/19/2021 CLINICAL DATA:  [place] EXAM: CT HEAD WITHOUT CONTRAST TECHNIQUE: Contiguous axial images were  obtained from the base of the skull through the vertex without intravenous contrast. COMPARISON:  July 28, 2020 FINDINGS: Brain: Mild age related global parenchymal volume loss. Stable mild burden of chronic ischemic white matter disease. No evidence of acute large vascular territory infarction, hemorrhage, hydrocephalus, extra-axial collection or mass lesion/mass effect. Vascular: No hyperdense vessel. Atherosclerotic  calcifications of the internal carotid and vertebral arteries at the skull base. Skull: Normal. Negative for fracture or focal lesion. Sinuses/Orbits: Visualized portions of the paranasal sinuses and mastoid air cells are predominantly clear. Orbits are grossly unremarkable. Other: None IMPRESSION: 1. No acute intracranial findings. 2. Stable mild age related global parenchymal volume loss and chronic ischemic white matter disease. Electronically Signed   By: Dahlia Bailiff MD   On: 01/19/2021 20:07   MR BRAIN WO CONTRAST  Result Date: 01/19/2021 CLINICAL DATA:  Encephalopathy EXAM: MRI HEAD WITHOUT CONTRAST TECHNIQUE: Multiplanar, multiecho pulse sequences of the brain and surrounding structures were obtained without intravenous contrast. COMPARISON:  None. FINDINGS: Brain: No acute infarct, mass effect or extra-axial collection. No acute or chronic hemorrhage. Hyperintense T2-weighted signal is moderately widespread throughout the white matter. Generalized volume loss without a clear lobar predilection. The midline structures are normal. Vascular: Major flow voids are preserved. Skull and upper cervical spine: Normal calvarium and skull base. Visualized upper cervical spine and soft tissues are normal. Sinuses/Orbits:No paranasal sinus fluid levels or advanced mucosal thickening. No mastoid or middle ear effusion. Normal orbits. IMPRESSION: 1. No acute intracranial abnormality. 2. Generalized volume loss and findings of chronic microvascular ischemia. Electronically Signed   By: Ulyses Jarred M.D.   On: 01/19/2021 22:40   DG Chest Portable 1 View  Result Date: 01/19/2021 CLINICAL DATA:  Pt states no chest complaints, but seems altered, see notes below: Pt comes from dialysis via EMS. Pt got 3.5 hrs of 4 hrs tx with 3.2 L removed. Staff stopped tx when pt became altered. BGL was 35. 25g D50 via dialysis port aces.*comment was truncated*sob EXAM: PORTABLE CHEST 1 VIEW COMPARISON:  12/10/2018 FINDINGS: Stable  large cardiac silhouette. No pulmonary edema pleural fluid. No focal consolidation no pneumothorax. IMPRESSION: Cardiomegaly.  No evidence of pulmonary edema. Electronically Signed   By: Suzy Bouchard M.D.   On: 01/19/2021 19:31    Procedures Procedures   Medications Ordered in ED Medications  dextrose 10 % infusion ( Intravenous New Bag/Given 01/19/21 2256)  heparin injection 5,000 Units (5,000 Units Subcutaneous Given 01/19/21 2256)  pantoprazole (PROTONIX) EC tablet 40 mg (has no administration in time range)  thiamine tablet 100 mg (has no administration in time range)  metoprolol tartrate (LOPRESSOR) injection 2.5 mg (has no administration in time range)  dextrose 50 % solution 50 mL (50 mLs Intravenous Given 01/19/21 2123)    ED Course  I have reviewed the triage vital signs and the nursing notes.  Pertinent labs & imaging results that were available during my care of the patient were reviewed by me and considered in my medical decision making (see chart for details).    MDM Rules/Calculators/A&P                          65 year old male presents to ER with concern for hypoglycemia, lethargy.  On arrival here vital stable, patient not in distress but is somewhat lethargic.  Basic labs stable.  Known dialysis patient.  Initial glucose here was okay.  This however did trend down.  Required additional D50.  On neuro  exam, no focal neurodeficits except noted anisocoria.  Did not appreciate documentation of this in past notes.  CT head was negative.  Discussed with neurology, [place], he recommended checking MRI.  Given recurrent hypoglycemia, lethargy, will admit to hospitalist for further management, follow-up on MRI.   Final Clinical Impression(s) / ED Diagnoses Final diagnoses:  Hypoglycemia  Anisocoria  Lethargy    Rx / DC Orders ED Discharge Orders     None        Lucrezia Starch, MD 01/20/21 0010

## 2021-01-19 NOTE — ED Notes (Signed)
Pt transported to MRI 

## 2021-01-19 NOTE — H&P (Signed)
Pulse 90 History and Physical  Raymond [name] WGN:[id number] DOB: Sep 27, 1955 DOA: 01/19/2021  Referring physician: Dr. Erenest Blank, [place]. PCP: [name], FNP  Outpatient Specialists: GI. Patient coming from: Home through HD via EMS.  Chief Complaint: Altered mental status.  HPI: Raymond [name] is a 65 y.o. male with medical history significant for polysubstance abuse including ongoing cocaine abuse, ESRD on HD TTS, previous GI bleed, distal small bowel AVMs from EGD done on 08/04/2020, peripheral artery disease, paroxysmal A. fib not on anticoagulation, who presented to [facility] from dialysis via EMS due to altered mental status and severe hypoglycemia with blood sugar of 35, improved after 1 amp of D50.  Patient had 3.5/4 hours of treatment with 3.2 L of volume removed.  At the hemodialysis center the patient was altered from his baseline, vomited, was lethargic and difficult to arouse.  He was brought into the ED for further evaluation.  CT head was nonacute.  Was noted to have unequal pupils.  Neurology was consulted by EDP and recommended MRI brain which was non acute.  Admitted to ongoing cocaine use.  Last use was 1-2 days prior to admission.  States he has had a difficult time coming off of cocaine.  TRH, hospitalist team, was asked to admit.  ED Course:  Temperature 97.5.  BP 157/112, 82, respiration rate 13, O2 saturation 100% on room air.  Lab studies remarkable for BUN 54, creatinine 7.49, serum bicarb 30, glucose 127, ammonia 34.  Troponin 31, 28.  WBC 1.8, hemoglobin 7.3, platelet count 83.  COVID-19 screening test pending at the time of dictation.  Review of Systems: Review of systems as noted in the HPI. All other systems reviewed and are negative.   Past Medical History:  Diagnosis Date   Anemia    CAD (coronary artery disease)    a. 03/2018: cath showing 15 to 30% stenosis along the LAD, luminal irregularities along the RCA, and angiographically normal LCx   COPD (chronic obstructive  pulmonary disease) (HCC)    ESRD (end stage renal disease) on dialysis [facility])    "MWF; [facility] R[facility]" (07/22/17)   Hepatitis C    Still positive s/p liver biopsy at [facility]  and interferon therapy for 6 months. Most recent lab work was on 10/24/12   Hepatitis C    "took the tx; gone now" (12/05/2016)  Was treated   History of blood transfusion ~ 2012/2013   "related to my kidneys; blood was low"   Hypertension    Peripheral arterial disease ([place])    Substance abuse ([place])    Thyroid disease    Past Surgical History:  Procedure Laterality Date   ABDOMINAL AORTOGRAM W/LOWER EXTREMITY N/A 02/08/2018   Procedure: ABDOMINAL AORTOGRAM W/LOWER EXTREMITY;  Surgeon: Conrad Luverne, MD;  Location: [place] [facility];  Service: Cardiovascular;  Laterality: N/A;   ABDOMINAL AORTOGRAM W/LOWER EXTREMITY Bilateral 06/15/2020   Procedure: ABDOMINAL AORTOGRAM W/LOWER EXTREMITY;  Surgeon: Waynetta Sandy, MD;  Location: [place] [facility];  Service: Cardiovascular;  Laterality: Bilateral;   AV FISTULA PLACEMENT Left Aug. 2013 ?   BIOPSY  01/17/2020   Procedure: BIOPSY;  Surgeon: Jackquline Denmark, MD;  Location: [place];  Service: Endoscopy;;   COLONOSCOPY WITH PROPOFOL N/A 08/21/2018   Procedure: COLONOSCOPY WITH PROPOFOL;  Surgeon: Yetta Flock, MD;  Location: [facility];  Service: Gastroenterology;  Laterality: N/A;   ENTEROSCOPY N/A 01/17/2020   Procedure: ENTEROSCOPY;  Surgeon: Jackquline Denmark, MD;  Location: [facility] ENDOSCOPY;  Service: Endoscopy;  Laterality: N/A;   ENTEROSCOPY N/A 07/31/2020   Procedure: ENTEROSCOPY;  Surgeon: Doran Stabler, MD;  Location: [place];  Service: Gastroenterology;  Laterality: N/A;   ESOPHAGOGASTRODUODENOSCOPY (EGD) WITH PROPOFOL N/A 08/20/2018   Procedure: ESOPHAGOGASTRODUODENOSCOPY (EGD) WITH PROPOFOL;  Surgeon: Gatha Mayer, MD;  Location: [place];  Service: Endoscopy;  Laterality: N/A;   FEMORAL-POPLITEAL BYPASS GRAFT Left 04/11/2018   Procedure:  BYPASS GRAFT FEMORAL-POPLITEAL ARTERY LEFT LEG;  Surgeon: Rosetta Posner, MD;  [facility];  Service: Vascular;  Laterality: Left;   GIVENS CAPSULE STUDY N/A 07/31/2020   Procedure: GIVENS CAPSULE STUDY;  Surgeon: Doran Stabler, MD;  Location: [place];  Service: Gastroenterology;  Laterality: N/A;   HOT HEMOSTASIS N/A 08/20/2018   Procedure: HOT HEMOSTASIS (ARGON PLASMA COAGULATION/BICAP);  Surgeon: Gatha Mayer, MD;  Location: [facility] ENDOSCOPY;  Service: Endoscopy;  Laterality: N/A;   HOT HEMOSTASIS N/A 01/17/2020   Procedure: HOT HEMOSTASIS (ARGON PLASMA COAGULATION/BICAP);  Surgeon: Jackquline Denmark, MD;  Location: [facility] ENDOSCOPY;  Service: Endoscopy;  Laterality: N/A;   LEFT HEART CATH AND CORONARY ANGIOGRAPHY N/A 03/29/2018   Procedure: LEFT HEART CATH AND CORONARY ANGIOGRAPHY;  Surgeon: Nelva Bush, MD;  Location: [place] [facility];  Service: Cardiovascular;  Laterality: N/A;   LIVER BIOPSY     ORIF ULNAR FRACTURE Left 05/18/2017   Procedure: OPEN REDUCTION INTERNAL FIXATION (ORIF) ULNAR FRACTURE;  Surgeon: Iran Planas, MD;  Location: [place];  Service: Orthopedics;  Laterality: Left;   PERIPHERAL VASCULAR INTERVENTION Right 06/15/2020   Procedure: PERIPHERAL VASCULAR INTERVENTION;  Surgeon: Waynetta Sandy, MD;  Location: [place] [facility];  Service: Cardiovascular;  Laterality: Right;   POLYPECTOMY  08/21/2018   Procedure: POLYPECTOMY;  Surgeon: Yetta Flock, MD;  Location: [place];  Service: Gastroenterology;;   Earney Mallet N/A 12/11/2012   Procedure: Earney Mallet;  Surgeon: Serafina Mitchell, MD;  Location: [facility] CATH LAB;  Service: Cardiovascular;  Laterality: N/A;   WOUND EXPLORATION Left 06/15/2020   Procedure: GROIN  EXPLORATION;  Surgeon: Waynetta Sandy, MD;  Location: [place];  Service: Vascular;  Laterality: Left;    Social History:  reports that he has been smoking cigarettes. He has a 12.50 pack-year smoking history. He has never used smokeless  tobacco. He reports current alcohol use. He reports previous drug use. Drug: Cocaine.   No Known Allergies  Family History  Problem Relation Age of Onset   Diabetes Father    Hypertension Father    Heart disease Father       Prior to Admission medications   Medication Sig Start Date End Date Taking? Authorizing Provider  B Complex-C-Folic Acid (RENAL-VITE) 0.8 MG TABS Take 1 tablet by mouth daily.   Yes [provider]  docusate sodium (COLACE) 100 MG capsule Take 100 mg by mouth daily.   Yes [provider]  pantoprazole (PROTONIX) 40 MG tablet Take 40 mg by mouth daily.   Yes [provider]  sevelamer carbonate (RENVELA) 800 MG tablet Take 4 tablets (3,200 mg total) by mouth 3 (three) times daily with meals. Patient taking differently: Take 4,000 mg by mouth 3 (three) times daily with meals. And take 4000 mg with snacks. 09/14/18  Yes Vasireddy, Grier Mitts, MD  acetaminophen (TYLENOL) 325 MG tablet Take 650 mg by mouth every 6 (six) hours as needed for mild pain.    [provider]  albuterol (VENTOLIN HFA) 108 (90 Base) MCG/ACT inhaler Inhale 2 puffs into the lungs every  4 (four) hours as needed for shortness of breath or wheezing. 11/06/19   [provider]  amiodarone (PACERONE) 200 MG tablet Take 1 tablet (200 mg total) by mouth daily. 12/11/20   Mercy Riding, MD  calcitRIOL (ROCALTROL) 0.25 MCG capsule Take 9 capsules (2.25 mcg total) by mouth Every Tuesday,Thursday,and Saturday with dialysis. 12/12/20   Mercy Riding, MD  carvedilol (COREG) 6.25 MG tablet Take 1 tablet (6.25 mg total) by mouth 2 (two) times daily with a meal. 12/11/20   Mercy Riding, MD  cinacalcet (SENSIPAR) 30 MG tablet Take 6 tablets (180 mg total) by mouth Every Tuesday,Thursday,and Saturday with dialysis. 12/12/20   Mercy Riding, MD  clopidogrel (PLAVIX) 75 MG tablet TAKE 1 TABLET (75 MG TOTAL) BY MOUTH DAILY. Patient not taking: Reported on 12/09/2020 06/22/20 06/22/21   Karoline Caldwell, PA-C  folic acid (FOLVITE) 1 MG tablet Take 1 tablet (1 mg total) by mouth daily. 12/12/20   Mercy Riding, MD  Multiple Vitamin (MULTIVITAMIN WITH MINERALS) TABS tablet Take 1 tablet by mouth daily. 12/12/20   Mercy Riding, MD  nicotine (NICODERM CQ - DOSED IN MG/24 HOURS) 14 mg/24hr patch Place 1 patch (14 mg total) onto the skin daily. 12/12/20   Mercy Riding, MD  prochlorperazine (COMPAZINE) 5 MG tablet Take 1 tablet (5 mg total) by mouth every 8 (eight) hours as needed for nausea or vomiting. 12/11/20   Mercy Riding, MD  rosuvastatin (CRESTOR) 10 MG tablet Take 1 tablet (10 mg total) by mouth daily. 12/11/20 06/09/21  Mercy Riding, MD  thiamine 100 MG tablet Take 1 tablet (100 mg total) by mouth daily. 12/12/20   Mercy Riding, MD  apixaban (ELIQUIS) 5 MG TABS tablet Take 1 tablet (5 mg total) by mouth 2 (two) times daily. 06/22/20 08/03/20  Karoline Caldwell, PA-C    Physical Exam: BP (!) 157/112   Pulse 92   Temp 97.9 F (36.6 C) (Oral)   Resp 13   SpO2 100%   General: 65 y.o. year-old male well developed well nourished in no acute distress.  Alert and oriented x3. Cardiovascular: Tachycardic with no rubs or gallops.  No thyromegaly or JVD noted.  No lower extremity edema. 2/4 pulses in all 4 extremities. Respiratory: Clear to auscultation with no wheezes or rales. Good inspiratory effort. Abdomen: Soft nontender nondistended with normal bowel sounds x4 quadrants. Muskuloskeletal: No cyanosis, clubbing or edema noted bilaterally Neuro: CN II-XII intact, strength, sensation, reflexes Skin: No ulcerative lesions noted or rashes Psychiatry: Judgement and insight appear normal. Mood is appropriate for condition and setting          Labs on Admission:  Basic Metabolic Panel: Recent Labs  Lab 01/19/21 1729  NA 135  K 3.8  CL 95*  CO2 30  GLUCOSE 127*  BUN 54*  CREATININE 7.49*  CALCIUM 7.8*   Liver Function Tests: Recent Labs  Lab 01/19/21 1729  AST 12*  ALT 8   ALKPHOS 52  BILITOT 0.9  PROT 7.1  ALBUMIN 3.2*   No results for input(s): LIPASE, AMYLASE in the last 168 hours. Recent Labs  Lab 01/19/21 1729  AMMONIA 34   CBC: Recent Labs  Lab 01/19/21 1729  WBC 1.8*  NEUTROABS 1.0*  HGB 7.3*  HCT 24.0*  MCV 107.6*  PLT 83*   Cardiac Enzymes: No results for input(s): CKTOTAL, CKMB, CKMBINDEX, TROPONINI in the last 168 hours.  BNP (last 3 results) No results for input(s): BNP  in the last 8760 hours.  ProBNP (last 3 results) No results for input(s): PROBNP in the last 8760 hours.  CBG: Recent Labs  Lab 01/19/21 1721 01/19/21 1826 01/19/21 1952 01/19/21 2100  GLUCAP 133* 92 73 63*    Radiological Exams on Admission: CT Head Wo Contrast  Result Date: 01/19/2021 CLINICAL DATA:  Delirium EXAM: CT HEAD WITHOUT CONTRAST TECHNIQUE: Contiguous axial images were obtained from the base of the skull through the vertex without intravenous contrast. COMPARISON:  July 28, 2020 FINDINGS: Brain: Mild age related global parenchymal volume loss. Stable mild burden of chronic ischemic white matter disease. No evidence of acute large vascular territory infarction, hemorrhage, hydrocephalus, extra-axial collection or mass lesion/mass effect. Vascular: No hyperdense vessel. Atherosclerotic calcifications of the internal carotid and vertebral arteries at the skull base. Skull: Normal. Negative for fracture or focal lesion. Sinuses/Orbits: Visualized portions of the paranasal sinuses and mastoid air cells are predominantly clear. Orbits are grossly unremarkable. Other: None IMPRESSION: 1. No acute intracranial findings. 2. Stable mild age related global parenchymal volume loss and chronic ischemic white matter disease. Electronically Signed   By: Dahlia Bailiff MD   On: 01/19/2021 20:07   DG Chest Portable 1 View  Result Date: 01/19/2021 CLINICAL DATA:  Pt states no chest complaints, but seems altered, see notes below: Pt comes from dialysis via EMS.  Pt got 3.5 hrs of 4 hrs tx with 3.2 L removed. Staff stopped tx when pt became altered. BGL was 35. 25g D50 via dialysis port aces.*comment was truncated*sob EXAM: PORTABLE CHEST 1 VIEW COMPARISON:  12/10/2018 FINDINGS: Stable large cardiac silhouette. No pulmonary edema pleural fluid. No focal consolidation no pneumothorax. IMPRESSION: Cardiomegaly.  No evidence of pulmonary edema. Electronically Signed   By: Suzy Bouchard M.D.   On: 01/19/2021 19:31    EKG: I independently viewed the EKG done and my findings are as followed: Sinus tachycardia rate of 119, ST depression involving anterolateral leads.  QTc 487.  Assessment/Plan Present on Admission:  AMS (altered mental status)  Active Problems:   AMS (altered mental status)  Acute metabolic encephalopathy suspect multifactorial Severely hypoglycemic with CBG 35.  Improved after 1 amp of bicarb. CT head nonacute. MRI brain non acute. TSH 2.734 on 12/09/2020. Ammonia level normal at 34. Vitamin B12 level over 1200 on 12/11/2020. Obtain UDS, admits to ongoing use of cocaine. Reorient as needed Closely monitor on progressive unit. Neurology consulted.  Hypoglycemia, unclear etiology Blood sugar 35 at hemodialysis improved with amp of D50 Obtain hemoglobin A1c Started D10 at 30 cc/h x 1 day CBG every 1 hour x 5 occurrences until blood sugar has normalized.  ESRD on HD TTS Hemodialysis completed 3.5 out of 4 hours, 3.2 L of volume removed. Electrolytes are stable Consult nephrology in the AM to continue hemodialysis while inpatient.  Elevated troponin, suspect demand ischemia Troponin peaked at 31, down trended to 28 Denies any anginal symptoms. Closely monitor on telemetry  Paroxysmal A. fib not on anticoagulation due to history of GI bleed with distal small bowel AVMs Resume home regimen once med rec is reconciled.  History of distal small bowel AVMs Presented with anemia with hemoglobin of 7.3. Closely monitor H&H Repeat CBC  in the morning Consent received to transfuse as indicated.  Pancytopenia, unclear etiology Presented with WBC 1.8, hemoglobin 7.3, platelet count of 83 Repeat CBC in the morning  Polysubstance abuse including cocaine UDS ordered, pending.  Chronic diastolic CHF Last 2D echo done on 04/16/2018 revealed LVEF 60 to  65%, normal LV size with severe left ventricular hypertrophy.  Left atrium was moderately dilated.   DVT prophylaxis: Subcu heparin 3 times daily  Code Status: Full code  Family Communication: None at bedside  Disposition Plan: Admit to progressive unit  Consults called: Please consult nephrology to resume hemodialysis at regular schedule  Admission status: Inpatient status.  Patient will require at least 2 midnights for further evaluation and treatment of present condition.   Status is: Inpatient    Dispo:  Patient From: Home  Planned Disposition: Home, possibly on 01/21/2021 or when symptomatology has improved, neurology and nephrology sign off.  Medically stable for discharge: No         Kayleen Memos MD [facility] Pager [phone]  If 7PM-7AM, please contact night-coverage [email or web address] Password [facility]  01/19/2021, 10:29 PM

## 2021-01-19 NOTE — ED Triage Notes (Signed)
Pt comes from dialysis via EMS. Pt got 3.5 hrs of 4 hrs tx with 3.2 L removed. Staff stopped tx when pt became altered. BGL was 35. 25g D50 via dialysis port acess. BGL became 199. Pt remains altered from his baseline per staff. Pt vomited per dialysis staff. Pt a/o x2 baseline is x4. Pt is lethargic and difficult to arouse. 12 lead unremarkable. Pt's dialysis port remains accessed.  BP 159/100 HR 90

## 2021-01-19 NOTE — ED Notes (Signed)
Pt did not refuse to sign MSE but pt unable to sign due to AMS

## 2021-01-19 NOTE — ED Notes (Signed)
Patient transported to CT 

## 2021-01-20 ENCOUNTER — Encounter (HOSPITAL_COMMUNITY): Payer: Self-pay | Admitting: Internal Medicine

## 2021-01-20 DIAGNOSIS — R531 Weakness: Secondary | ICD-10-CM

## 2021-01-20 DIAGNOSIS — E162 Hypoglycemia, unspecified: Secondary | ICD-10-CM

## 2021-01-20 DIAGNOSIS — G9341 Metabolic encephalopathy: Secondary | ICD-10-CM

## 2021-01-20 DIAGNOSIS — F1410 Cocaine abuse, uncomplicated: Secondary | ICD-10-CM

## 2021-01-20 DIAGNOSIS — N186 End stage renal disease: Secondary | ICD-10-CM

## 2021-01-20 DIAGNOSIS — F17200 Nicotine dependence, unspecified, uncomplicated: Secondary | ICD-10-CM

## 2021-01-20 LAB — CBG MONITORING, ED
Glucose-Capillary: 109 mg/dL — ABNORMAL HIGH (ref 70–99)
Glucose-Capillary: 124 mg/dL — ABNORMAL HIGH (ref 70–99)

## 2021-01-20 LAB — CBC
HCT: 24.4 % — ABNORMAL LOW (ref 39.0–52.0)
Hemoglobin: 7.5 g/dL — ABNORMAL LOW (ref 13.0–17.0)
MCH: 32.3 pg (ref 26.0–34.0)
MCHC: 30.7 g/dL (ref 30.0–36.0)
MCV: 105.2 fL — ABNORMAL HIGH (ref 80.0–100.0)
Platelets: 107 10*3/uL — ABNORMAL LOW (ref 150–400)
RBC: 2.32 MIL/uL — ABNORMAL LOW (ref 4.22–5.81)
RDW: 17.4 % — ABNORMAL HIGH (ref 11.5–15.5)
WBC: 1.8 10*3/uL — ABNORMAL LOW (ref 4.0–10.5)
nRBC: 0 % (ref 0.0–0.2)

## 2021-01-20 LAB — RENAL FUNCTION PANEL
Albumin: 3.1 g/dL — ABNORMAL LOW (ref 3.5–5.0)
Anion gap: 9 (ref 5–15)
BUN: 54 mg/dL — ABNORMAL HIGH (ref 8–23)
CO2: 33 mmol/L — ABNORMAL HIGH (ref 22–32)
Calcium: 8 mg/dL — ABNORMAL LOW (ref 8.9–10.3)
Chloride: 93 mmol/L — ABNORMAL LOW (ref 98–111)
Creatinine, Ser: 8.04 mg/dL — ABNORMAL HIGH (ref 0.61–1.24)
GFR, Estimated: 7 mL/min — ABNORMAL LOW (ref >60)
Glucose, Bld: 190 mg/dL — ABNORMAL HIGH (ref 70–99)
Phosphorus: 5.6 mg/dL — ABNORMAL HIGH (ref 2.5–4.6)
Potassium: 3.7 mmol/L (ref 3.5–5.1)
Sodium: 135 mmol/L (ref 135–145)

## 2021-01-20 LAB — GLUCOSE, CAPILLARY
Glucose-Capillary: 93 mg/dL (ref 70–99)
Glucose-Capillary: 88 mg/dL (ref 70–99)
Glucose-Capillary: 155 mg/dL — ABNORMAL HIGH (ref 70–99)
Glucose-Capillary: 126 mg/dL — ABNORMAL HIGH (ref 70–99)
Glucose-Capillary: 119 mg/dL — ABNORMAL HIGH (ref 70–99)

## 2021-01-20 LAB — HEMOGLOBIN A1C
Hgb A1c MFr Bld: 5.3 % (ref 4.8–5.6)
Mean Plasma Glucose: 105.41 mg/dL

## 2021-01-20 LAB — HIV ANTIBODY (ROUTINE TESTING W REFLEX): HIV Screen 4th Generation wRfx: NONREACTIVE

## 2021-01-20 LAB — SARS CORONAVIRUS 2 (TAT 6-24 HRS): SARS Coronavirus 2: NEGATIVE

## 2021-01-20 MED ORDER — FOLIC ACID 1 MG PO TABS
1.0000 mg | ORAL_TABLET | Freq: Every day | ORAL | Status: DC
  Administered 2021-01-20: 1 mg via ORAL
  Filled 2021-01-20: qty 1

## 2021-01-20 MED ORDER — ROSUVASTATIN CALCIUM 5 MG PO TABS
10.0000 mg | ORAL_TABLET | Freq: Every day | ORAL | Status: DC
  Administered 2021-01-20: 10 mg via ORAL
  Filled 2021-01-20: qty 2

## 2021-01-20 MED ORDER — SEVELAMER CARBONATE 800 MG PO TABS: 3200.0000 mg | ORAL_TABLET | Freq: Three times a day (TID) | ORAL | Status: DC

## 2021-01-20 MED ORDER — CINACALCET HCL 30 MG PO TABS: 180.0000 mg | ORAL_TABLET | ORAL | Status: DC

## 2021-01-20 MED ORDER — CARVEDILOL 6.25 MG PO TABS
6.2500 mg | ORAL_TABLET | Freq: Two times a day (BID) | ORAL | Status: DC
  Administered 2021-01-20: 6.25 mg via ORAL
  Filled 2021-01-20: qty 1

## 2021-01-20 MED ORDER — ALBUTEROL SULFATE (2.5 MG/3ML) 0.083% IN NEBU: 2.5000 mg | INHALATION_SOLUTION | RESPIRATORY_TRACT | Status: DC | PRN

## 2021-01-20 MED ORDER — CALCITRIOL 0.25 MCG PO CAPS: 2.2500 ug | ORAL_CAPSULE | ORAL | Status: DC

## 2021-01-20 MED ORDER — ACETAMINOPHEN 325 MG PO TABS: 650.0000 mg | ORAL_TABLET | Freq: Four times a day (QID) | ORAL | Status: DC | PRN

## 2021-01-20 MED ORDER — AMIODARONE HCL 200 MG PO TABS
200.0000 mg | ORAL_TABLET | Freq: Every day | ORAL | Status: DC
  Administered 2021-01-20: 200 mg via ORAL
  Filled 2021-01-20: qty 1

## 2021-01-20 MED ORDER — CALCITRIOL 0.25 MCG PO CAPS: 0.2500 ug | ORAL_CAPSULE | ORAL | Status: DC

## 2021-01-20 MED ORDER — ADULT MULTIVITAMIN W/MINERALS CH
1.0000 | ORAL_TABLET | Freq: Every day | ORAL | Status: DC
  Administered 2021-01-20: 1 via ORAL
  Filled 2021-01-20: qty 1

## 2021-01-20 MED ORDER — SEVELAMER CARBONATE 800 MG PO TABS
1600.0000 mg | ORAL_TABLET | Freq: Three times a day (TID) | ORAL | Status: DC
  Administered 2021-01-20: 1600 mg via ORAL
  Filled 2021-01-20: qty 2

## 2021-01-20 NOTE — TOC Initial Note (Addendum)
Transition of Care [facility]) - Initial/Assessment Note    Patient Details  Name: Raymond [name] MRN: [id number] Date of Birth: 1956-03-09  Transition of [facility]) CM/SW Contact:    Carles Collet, RN Phone Number: 01/20/2021, 11:44 AM  Clinical Narrative:          Patient admitted from HD (TTS) with AMS, low blood sugar. Current substance abuse with +Cocaine on 7/6 and  6/10. Recs for [facility] PT. Payor type and drug abuse will be barriers for [name] set up. Referral made to: [facility]- declined [facility]- declined [facility]- declined [facility]- declined [facility]-declined [facility]- declined  [facility] services could not be established for this patient.  Outpatient referral for PT can be made closer to DC.   13:20 Spoke w patient and he declined outpatient referral. Informed him he will DC today, and he states that he will take the bus home. He declined any DME needs.   15:45 Attempt to get patient RW or cane, he just received a rollator in January and one will not be covered by insurance, he declines private pay. States "I will be fine, I will make it home." Confirmed with him that he feels comfortable ambulating on and off bus w/o walker.  Will offer him a cab voucher          Expected Discharge Plan: Home/Self Care Barriers to Discharge: Continued Medical Work up   Patient Goals and CMS Choice        Expected Discharge Plan and Services Expected Discharge Plan: Home/Self Care   Discharge Planning Services: CM Consult   Living arrangements for the past 2 months: Apartment                                      Prior Living Arrangements/Services Living arrangements for the past 2 months: Apartment Lives with:: Self                   Activities of Daily Living Home Assistive Devices/Equipment: Cane (specify quad or straight), Walker (specify type) ADL Screening (condition at time of admission) Patient's cognitive ability adequate to safely complete daily activities?: Yes Is  the patient deaf or have difficulty hearing?: No Does the patient have difficulty seeing, even when wearing glasses/contacts?: No Does the patient have difficulty concentrating, remembering, or making decisions?: No Patient able to express need for assistance with ADLs?: Yes Does the patient have difficulty dressing or bathing?: No Independently performs ADLs?: Yes (appropriate for developmental age) Does the patient have difficulty walking or climbing stairs?: No Weakness of Legs: None Weakness of Arms/Hands: None  Permission Sought/Granted                  Emotional Assessment              Admission diagnosis:  Anisocoria [H57.02] Lethargy [R53.83] Hypoglycemia [E16.2] AMS (altered mental status) [R41.82] Patient Active Problem List   Diagnosis Date Noted   AMS (altered mental status) 01/19/2021   Acute upper GI bleed 24/23/5361   Acute metabolic encephalopathy 44/31/5400   COVID-19 virus infection 07/29/2020   Gastric AVM    AVM (arteriovenous malformation) of small bowel, acquired with hemorrhage    Current use of long term anticoagulation    History of arteriovenous malformation (AVM) 01/14/2020   UGIB (upper gastrointestinal bleed) 01/14/2020   Acute blood loss anemia 01/14/2020   Elevated troponin 01/14/2020   Gastroenteritis 12/24/2018   Substance  use disorder 12/24/2018   Noncompliance of patient with renal dialysis ([place]) 09/12/2018   Benign neoplasm of colon    Melena    Anemia due to chronic kidney disease, on chronic dialysis ([place])    Acute on chronic anemia    H/O medication noncompliance    Polysubstance (excluding opioids) dependence ([place])    Atrial flutter with rapid ventricular response ([place]) 08/10/2018   PAF (paroxysmal atrial fibrillation) ([place]) 08/01/2018   Acute respiratory failure with hypoxia ([place]) 07/13/2018   Non-compliance with renal dialysis ([place]) 07/13/2018   PAD (peripheral artery disease) ([place]) 04/11/2018   Sepsis ([place]) 03/31/2018    Preop cardiovascular exam 03/29/2018   Volume overload 08/07/2017   Acute pulmonary edema (HCC)    Dyspnea 06/20/2017   Acute bronchitis    COPD (chronic obstructive pulmonary disease) ([place])    End-stage renal disease on hemodialysis ([place])    Hypoxia 12/05/2016   Hyperkalemia 12/19/2015   Hypoglycemia 12/19/2015   Polysubstance abuse ([place]) 12/19/2015   Homeless 12/19/2015   Anemia associated with stage 5 chronic renal failure ([place]) 12/19/2015   Atypical chest pain 12/19/2013   Atherosclerosis of native arteries of extremity with intermittent claudication ([place]) 12/04/2012   ESRD (end stage renal disease) ([place]) 12/04/2012   HEPATITIS C 09/07/2006   HYPERCHOLESTEROLEMIA 09/07/2006   HYPERTENSION, BENIGN SYSTEMIC 09/07/2006   PCP:  [name], FNP Pharmacy:   [id number] - [place], [place] [address] [address] Phone: [phone] Fax: [phone]  [facility] [address] [place] [address] Phone: [phone] Fax: [phone]     Social Determinants of Health (SDOH) Interventions    Readmission Risk Interventions Readmission Risk Prevention Plan 06/22/2020  Transportation Screening Complete  Medication Review ([place]) Complete  PCP or Specialist appointment within 3-5 days of discharge Complete  HRI or [place] Complete  SW Recovery Care/Counseling Consult Complete  [place] Patient Refused  Some recent data might be hidden

## 2021-01-20 NOTE — Care Management CC44 (Signed)
Condition Code 44 Documentation Completed  Patient Details  Name: Raymond [name] MRN: [id number] Date of Birth: 11-20-1955   Condition Code 44 given:  Yes Patient signature on Condition Code 44 notice:  Yes Documentation of 2 MD's agreement:  Yes Code 44 added to claim:  Yes    Angelita Ingles, RN 01/20/2021, 1:08 PM

## 2021-01-20 NOTE — Discharge Summary (Signed)
Physician Discharge Summary  Raymond [name] FWY:[id number] DOB: 04/16/1956 DOA: 01/19/2021  PCP: [name], FNP  Admit date: 01/19/2021 Discharge date: 01/20/2021  Admitted From: Home Disposition: Home  Recommendations for Outpatient Follow-up:  Follow ups as below. Please obtain CBC/BMP/Mag at follow up Please follow up on the following pending results: None  Home Health: HH PT Equipment/Devices: None  Discharge Condition: Stable CODE STATUS: Full code   Follow-up Tonasket., FNP. Schedule an appointment as soon as possible for a visit in 1 week(s).   Specialty: Family Medicine Why: If symptoms worsen, As needed Contact information: [place] [address] [phone]                   Hospital Course: 65 year old M with PMH of ESRD on HD TTS, paroxysmal A. fib not on anticoagulation due to history of GIB/distal small bowel AVMs, cocaine use and tobacco use disorder brought to ED from outpatient HD with altered mental status and severe hypoglycemia to 35.  Patient was given 1 amp of D50 by EMS with improvement in his hyperglycemia.  Work-up in the ED including CT head and MRI brain without acute finding.  Slightly hypertensive.  Chemistry consistent with ESRD.  CBC with pancytopenia which is chronic.  Ammonia and TSH within normal.  COVID-19 PCR nonreactive.  Patient was started on D10 infusion and admitted.  Of note, patient had about 3.5 all of the 4-hour HD session prior to presentation.  The next day, encephalopathy resolved.  He was fully oriented.  No focal neurodeficits.  Hypoglycemia resolved as well.  Maintained appropriate CBG off IV dextrose for over 6 hours.  Evaluated by therapy , and discharged home with home health PT as recommended by therapy.  He has been counseled on tobacco and cocaine cessation.  See individual problem list below for more on hospital course.  Discharge Diagnoses:  Acute metabolic  encephalopathy-likely due to hypoglycemia.  Neuro exam was normal.  CT head and MRI brain without acute finding.  Resolved.  -Encouraged to eat regular meals and snacks in between as needed  Hypoglycemia-likely due to poor p.o. intake.  Not a diabetic.  Not on antihyperglycemic agents.  A1c 5.3%.  Resolved.  Paroxysmal A. Fib: Rate controlled.  Not on AC due to history of GIB/AVMs -Continue home amiodarone and Coreg  Pancytopenia: Chronic and stable. -Recheck CBC  ESRD on HD TTS: Had 3.5/4 hours of HD on the day of admission.  No urgent indication for HD. -Continue outpatient HD on schedule  Chronic diastolic CHF: Appears euvolemic.  No cardiopulmonary symptoms. -Fluid management with HD  Mildly elevated troponin: Likely delayed clearance due to ESRD.  No further work-up indicated  Cocaine use disorder -Encourage cessation.  Tobacco use disorder: Reports smoking about 6 cigarettes a day -Encourage cessation.  Generalized weakness -Home health PT as recommended by therapy   There is no height or weight on file to calculate BMI.            Discharge Exam: Vitals:   01/20/21 0748 01/20/21 1304  BP: 130/88 101/66  Pulse: 99 98  Resp: 18 18  Temp: 98.7 F (37.1 C)   SpO2: 100% 96%    GENERAL: No apparent distress.  Nontoxic. HEENT: MMM.  Vision and hearing grossly intact.  NECK: Supple.  No apparent JVD.  RESP: On RA.  No IWOB.  Fair aeration bilaterally. CVS:  RRR. Heart sounds normal.  ABD/GI/GU:  Bowel sounds present. Soft. Non tender.  MSK/EXT:  Moves extremities. No apparent deformity. No edema.  SKIN: no apparent skin lesion or wound NEURO: Awake, alert and oriented appropriately.  No apparent focal neuro deficit. PSYCH: Calm. Normal affect.   Discharge Instructions  Discharge Instructions     Call MD for:  difficulty breathing, headache or visual disturbances   Complete by: As directed    Call MD for:  extreme fatigue   Complete by: As directed     Call MD for:  persistant dizziness or light-headedness   Complete by: As directed    Call MD for:  persistant nausea and vomiting   Complete by: As directed    Call MD for:  temperature >100.4   Complete by: As directed    Diet - low sodium heart healthy   Complete by: As directed    Discharge instructions   Complete by: As directed    It has been a pleasure taking care of you!  You were hospitalized with altered mental status likely due to very low blood glucose from not eating well.  It is very important that you eat regular meals including breakfast, lunch and dinner as well as snacks in between if needed.     Take care,   Increase activity slowly   Complete by: As directed       Allergies as of 01/20/2021   No Known Allergies      Medication List     STOP taking these medications    Renal-Vite 0.8 MG Tabs       TAKE these medications    acetaminophen 325 MG tablet Commonly known as: TYLENOL Take 650 mg by mouth every 6 (six) hours as needed for mild pain.   albuterol 108 (90 Base) MCG/ACT inhaler Commonly known as: VENTOLIN HFA Inhale 2 puffs into the lungs every 4 (four) hours as needed for shortness of breath or wheezing.   amiodarone 200 MG tablet Commonly known as: PACERONE Take 1 tablet (200 mg total) by mouth daily.   calcitRIOL 0.25 MCG capsule Commonly known as: ROCALTROL Take 9 capsules (2.25 mcg total) by mouth Every Tuesday,Thursday,and Saturday with dialysis.   carvedilol 6.25 MG tablet Commonly known as: COREG Take 1 tablet (6.25 mg total) by mouth 2 (two) times daily with a meal.   cinacalcet 30 MG tablet Commonly known as: SENSIPAR Take 6 tablets (180 mg total) by mouth Every Tuesday,Thursday,and Saturday with dialysis.   docusate sodium 100 MG capsule Commonly known as: COLACE Take 100 mg by mouth daily.   folic acid 1 MG tablet Commonly known as: FOLVITE Take 1 tablet (1 mg total) by mouth daily.   multivitamin with minerals  Tabs tablet Take 1 tablet by mouth daily.   nicotine 14 mg/24hr patch Commonly known as: NICODERM CQ - dosed in mg/24 hours Place 1 patch (14 mg total) onto the skin daily.   pantoprazole 40 MG tablet Commonly known as: PROTONIX Take 40 mg by mouth daily.   prochlorperazine 5 MG tablet Commonly known as: COMPAZINE Take 1 tablet (5 mg total) by mouth every 8 (eight) hours as needed for nausea or vomiting.   rosuvastatin 10 MG tablet Commonly known as: CRESTOR Take 1 tablet (10 mg total) by mouth daily.   thiamine 100 MG tablet Take 1 tablet (100 mg total) by mouth daily.       ASK your doctor about these medications    clopidogrel 75 MG tablet Commonly known as: PLAVIX TAKE  1 TABLET (75 MG TOTAL) BY MOUTH DAILY.   sevelamer carbonate 800 MG tablet Commonly known as: RENVELA Take 4 tablets (3,200 mg total) by mouth 3 (three) times daily with meals.        Consultations: None  Procedures/Studies:   CT Head Wo Contrast  Result Date: 01/19/2021 CLINICAL DATA:  Delirium EXAM: CT HEAD WITHOUT CONTRAST TECHNIQUE: Contiguous axial images were obtained from the base of the skull through the vertex without intravenous contrast. COMPARISON:  July 28, 2020 FINDINGS: Brain: Mild age related global parenchymal volume loss. Stable mild burden of chronic ischemic white matter disease. No evidence of acute large vascular territory infarction, hemorrhage, hydrocephalus, extra-axial collection or mass lesion/mass effect. Vascular: No hyperdense vessel. Atherosclerotic calcifications of the internal carotid and vertebral arteries at the skull base. Skull: Normal. Negative for fracture or focal lesion. Sinuses/Orbits: Visualized portions of the paranasal sinuses and mastoid air cells are predominantly clear. Orbits are grossly unremarkable. Other: None IMPRESSION: 1. No acute intracranial findings. 2. Stable mild age related global parenchymal volume loss and chronic ischemic white matter  disease. Electronically Signed   By: Dahlia Bailiff MD   On: 01/19/2021 20:07   MR BRAIN WO CONTRAST  Result Date: 01/19/2021 CLINICAL DATA:  Encephalopathy EXAM: MRI HEAD WITHOUT CONTRAST TECHNIQUE: Multiplanar, multiecho pulse sequences of the brain and surrounding structures were obtained without intravenous contrast. COMPARISON:  None. FINDINGS: Brain: No acute infarct, mass effect or extra-axial collection. No acute or chronic hemorrhage. Hyperintense T2-weighted signal is moderately widespread throughout the white matter. Generalized volume loss without a clear lobar predilection. The midline structures are normal. Vascular: Major flow voids are preserved. Skull and upper cervical spine: Normal calvarium and skull base. Visualized upper cervical spine and soft tissues are normal. Sinuses/Orbits:No paranasal sinus fluid levels or advanced mucosal thickening. No mastoid or middle ear effusion. Normal orbits. IMPRESSION: 1. No acute intracranial abnormality. 2. Generalized volume loss and findings of chronic microvascular ischemia. Electronically Signed   By: Ulyses Jarred M.D.   On: 01/19/2021 22:40   DG Chest Portable 1 View  Result Date: 01/19/2021 CLINICAL DATA:  Pt states no chest complaints, but seems altered, see notes below: Pt comes from dialysis via EMS. Pt got 3.5 hrs of 4 hrs tx with 3.2 L removed. Staff stopped tx when pt became altered. BGL was 35. 25g D50 via dialysis port aces.*comment was truncated*sob EXAM: PORTABLE CHEST 1 VIEW COMPARISON:  12/10/2018 FINDINGS: Stable large cardiac silhouette. No pulmonary edema pleural fluid. No focal consolidation no pneumothorax. IMPRESSION: Cardiomegaly.  No evidence of pulmonary edema. Electronically Signed   By: Suzy Bouchard M.D.   On: 01/19/2021 19:31       The results of significant diagnostics from this hospitalization (including imaging, microbiology, ancillary and laboratory) are listed below for reference.      Microbiology: Recent Results (from the past 240 hour(s))  SARS CORONAVIRUS 2 (TAT 6-24 HRS) Nasopharyngeal Nasopharyngeal Swab     Status: None   Collection Time: 01/20/21  2:42 AM   Specimen: Nasopharyngeal Swab  Result Value Ref Range Status   SARS Coronavirus 2 NEGATIVE NEGATIVE Final    Comment: (NOTE) SARS-CoV-2 target nucleic acids are NOT DETECTED.  The SARS-CoV-2 RNA is generally detectable in upper and lower respiratory specimens during the acute phase of infection. Negative results do not preclude SARS-CoV-2 infection, do not rule out co-infections with other pathogens, and should not be used as the sole basis for treatment or other patient management decisions. Negative results  must be combined with clinical observations, patient history, and epidemiological information. The expected result is Negative.  Fact Sheet for Patients: SugarRoll.be  Fact Sheet for Healthcare Providers: https://www.woods-mathews.com/  This test is not yet approved or cleared by the [place] FDA and  has been authorized for detection and/or diagnosis of SARS-CoV-2 by FDA under an Emergency Use Authorization (EUA). This EUA will remain  in effect (meaning this test can be used) for the duration of the COVID-19 declaration under Se ction 564(b)(1) of the Act, 21 U.S.C. section 360bbb-3(b)(1), unless the authorization is terminated or revoked sooner.  Performed at [facility], [place] [address] [place], [place] [address]      Labs:  CBC: Recent Labs  Lab 01/19/21 1729 01/20/21 0327  WBC 1.8* 1.8*  NEUTROABS 1.0*  --   HGB 7.3* 7.5*  HCT 24.0* 24.4*  MCV 107.6* 105.2*  PLT 83* 107*   BMP &GFR Recent Labs  Lab 01/19/21 1729 01/20/21 0327  NA 135 135  K 3.8 3.7  CL 95* 93*  CO2 30 33*  GLUCOSE 127* 190*  BUN 54* 54*  CREATININE 7.49* 8.04*  CALCIUM 7.8* 8.0*  PHOS  --  5.6*   CrCl cannot be calculated (Unknown ideal  weight.). Liver & Pancreas: Recent Labs  Lab 01/19/21 1729 01/20/21 0327  AST 12*  --   ALT 8  --   ALKPHOS 52  --   BILITOT 0.9  --   PROT 7.1  --   ALBUMIN 3.2* 3.1*   No results for input(s): LIPASE, AMYLASE in the last 168 hours. Recent Labs  Lab 01/19/21 1729  AMMONIA 34   Diabetic: Recent Labs    01/20/21 0327  HGBA1C 5.3   Recent Labs  Lab 01/20/21 0029 01/20/21 0141 01/20/21 0241 01/20/21 0623 01/20/21 1203  GLUCAP 124* 109* 93 88 155*   Cardiac Enzymes: No results for input(s): CKTOTAL, CKMB, CKMBINDEX, TROPONINI in the last 168 hours. No results for input(s): PROBNP in the last 8760 hours. Coagulation Profile: No results for input(s): INR, PROTIME in the last 168 hours. Thyroid Function Tests: No results for input(s): TSH, T4TOTAL, FREET4, T3FREE, THYROIDAB in the last 72 hours. Lipid Profile: No results for input(s): CHOL, HDL, LDLCALC, TRIG, CHOLHDL, LDLDIRECT in the last 72 hours. Anemia Panel: No results for input(s): VITAMINB12, FOLATE, FERRITIN, TIBC, IRON, RETICCTPCT in the last 72 hours. Urine analysis:    Component Value Date/Time   COLORURINE YELLOW 05/08/2010 [place] 05/08/2010 1335   LABSPEC 1.009 05/08/2010 1335   PHURINE 6.0 05/08/2010 1335   GLUCOSEU NEGATIVE 05/08/2010 1335   HGBUR NEGATIVE 05/08/2010 1335   BILIRUBINUR NEGATIVE 05/08/2010 1335   KETONESUR NEGATIVE 05/08/2010 1335   PROTEINUR 100 (A) 05/08/2010 1335   UROBILINOGEN 0.2 05/08/2010 1335   NITRITE NEGATIVE 05/08/2010 1335   LEUKOCYTESUR SMALL (A) 05/08/2010 1335   Sepsis Labs: Invalid input(s): PROCALCITONIN, LACTICIDVEN   Time coordinating discharge: 35 minutes  SIGNED:  Mercy Riding, MD  [facility] 01/20/2021, 1:17 PM  If 7PM-7AM, please contact night-coverage [email or web address]

## 2021-01-20 NOTE — Evaluation (Signed)
Occupational Therapy Evaluation Patient Details Name: Raymond [name] MRN: [id number] DOB: 03/03/1956 Today's Date: 01/20/2021    History of Present Illness 65 y/o male presented to ED on 7/12 from dialysis for AMS. MRI with no acute abnormality. Potential hypoglycemic event. PMH: ESRD on HD, CAD, PAD, alcohol abuse, tobacco abuse, hep C s/p treatment, PVD, COPD, HTN   Clinical Impression   Patient evaluated by Occupational Therapy with no further acute OT needs identified. All education has been completed and the patient has no further questions. Pt was able to perform ADLs mod I this date with OT.  He scored 4/28 on the Short Blessed Test which does not indicate a cognitive deficits for this assessment. Anticipate he is getting close to his baseline.  See below for any follow-up Occupational Therapy or equipment needs. OT is signing off. Thank you for this referral.'    Follow Up Recommendations  No OT follow up    Equipment Recommendations  None recommended by OT (pt not intereseted in shower seat)    Recommendations for Other Services       Precautions / Restrictions Precautions Precautions: Fall Restrictions Weight Bearing Restrictions: No      Mobility Bed Mobility Overal bed mobility: Modified Independent                  Transfers Overall transfer level: Modified independent Equipment used: Rolling walker (2 wheeled);4-wheeled walker Transfers: Sit to/from [facility] Sit to Stand: Modified independent (Device/Increase time)         General transfer comment: supervision for safety    Balance Overall balance assessment: Mild deficits observed, not formally tested                                         ADL either performed or assessed with clinical judgement   ADL Overall ADL's : Modified independent                                             Vision Patient Visual Report: No change from baseline        Perception     Praxis      Pertinent Vitals/Pain Pain Assessment: Faces Faces Pain Scale: Hurts a little bit Pain Location: R UE Pain Descriptors / Indicators: Sharp;Grimacing Pain Intervention(s): Monitored during session     Hand Dominance     Extremity/Trunk Assessment Upper Extremity Assessment Upper Extremity Assessment: Overall WFL for tasks assessed   Lower Extremity Assessment Lower Extremity Assessment: Defer to PT evaluation   Cervical / Trunk Assessment Cervical / Trunk Assessment: Normal   Communication Communication Communication: No difficulties   Cognition Arousal/Alertness: Awake/alert Behavior During Therapy: WFL for tasks assessed/performed Overall Cognitive Status: No family/caregiver present to determine baseline cognitive functioning Area of Impairment: Memory                     Memory: Decreased short-term memory         General Comments: Pt scored 4/28 on the Short Blessed Test which does not indicate a cognitive deficit on this assessment.   General Comments  Pt feels he is close to baseline, but fees weaker overall    Exercises     Shoulder Instructions      Home Living  Family/patient expects to be discharged to:: Private residence Living Arrangements: Alone Available Help at Discharge: Family;Friend(s);Available PRN/intermittently Type of Home: Apartment Home Access: Elevator     Home Layout: One level     Bathroom Shower/Tub: Teacher, early years/pre: Standard Bathroom Accessibility: Yes   Home Equipment: Grab bars - toilet;Grab bars - tub/shower;Walker - 2 wheels;Cane - single point          Prior Functioning/Environment Level of Independence: Independent with assistive device(s)        Comments: independent with ADLs and iADLs. Uses RW for community ambulation.        OT Problem List: Decreased activity tolerance      OT Treatment/Interventions:      OT Goals(Current goals can be  found in the care plan section) Acute Rehab OT Goals Patient Stated Goal: to go home OT Goal Formulation: All assessment and education complete, DC therapy  OT Frequency:     Barriers to D/C:            Co-evaluation              AM-PAC OT "6 Clicks" Daily Activity     Outcome Measure Help from another person eating meals?: None Help from another person taking care of personal grooming?: None Help from another person toileting, which includes using toliet, bedpan, or urinal?: None Help from another person bathing (including washing, rinsing, drying)?: None Help from another person to put on and taking off regular upper body clothing?: None Help from another person to put on and taking off regular lower body clothing?: None 6 Click Score: 24   End of Session Equipment Utilized During Treatment: Rolling walker Nurse Communication: Mobility status  Activity Tolerance: Patient tolerated treatment well Patient left: in bed;with call bell/phone within reach;with bed alarm set  OT Visit Diagnosis: Unsteadiness on feet (R26.81)                Time: 6803-2122 OT Time Calculation (min): 17 min Charges:  OT General Charges $OT Visit: 1 Visit OT Evaluation $OT Eval Low Complexity: 1 Low  Nilsa Nutting., OTR/L Acute Rehabilitation Services Pager [phone] Office [phone]   Lucille Passy M 01/20/2021, 12:10 PM

## 2021-01-20 NOTE — Progress Notes (Signed)
Pt being dc home in a cab. Cab will drop off patient at the front door of he housing. RN walked patient around the unit to assess stability. PT and SW feel he is ok to leave without a walker.

## 2021-01-20 NOTE — Evaluation (Signed)
Physical Therapy Evaluation Patient Details Name: Raymond [name] MRN: [id number] DOB: [birth year] Today's Date: 01/20/2021   History of Present Illness  65 y/o male presented to ED on 7/12 from dialysis for AMS. MRI with no acute abnormality. Potential hypoglycemic event. PMH: ESRD on HD, CAD, PAD, alcohol abuse, tobacco abuse, hep C s/p treatment, PVD, COPD, HTN  Clinical Impression  PTA, patient lives alone and reports independence with mobility and use of RW at times for stability with community ambulation. Patient presents with generalized weakness, decreased activity tolerance, impaired balance, and STM deficits. Patient requires supervision for OOB mobility this date for safety and directional cueing. Patient will benefit from skilled PT services during acute stay to address listed deficits. Recommend HHPT following discharge to maximize safety with home mobility and address balance and strength deficits.     Follow Up Recommendations Home health PT    Equipment Recommendations  None recommended by PT    Recommendations for Other Services       Precautions / Restrictions Precautions Precautions: Fall Restrictions Weight Bearing Restrictions: No      Mobility  Bed Mobility Overal bed mobility: Modified Independent                  Transfers Overall transfer level: Needs assistance Equipment used: Rolling Fathima Bartl (2 wheeled) Transfers: Sit to/from Stand Sit to Stand: Supervision         General transfer comment: supervision for safety  Ambulation/Gait Ambulation/Gait assistance: Supervision Gait Distance (Feet): 225 Feet Assistive device: Rolling Vika Buske (2 wheeled) Gait Pattern/deviations: Step-through pattern;Decreased stride length;Drifts right/left Gait velocity: decreased   General Gait Details: patient reporting LE stiffness throughout ambulation. Supervision for safety and providing directional cueing due to STM deficits noted. Prefers to ambulate with  RW this date  Stairs            Wheelchair Mobility    Modified Rankin (Stroke Patients Only)       Balance Overall balance assessment: Mild deficits observed, not formally tested                                           Pertinent Vitals/Pain Pain Assessment: Faces Faces Pain Scale: Hurts a little bit Pain Location: R UE Pain Descriptors / Indicators: Sharp;Grimacing Pain Intervention(s): Monitored during session    Home Living Family/patient expects to be discharged to:: Private residence Living Arrangements: Alone Available Help at Discharge: Family;Friend(s);Available PRN/intermittently Type of Home: Apartment Home Access: Elevator     Home Layout: One level Home Equipment: Grab bars - toilet;Grab bars - tub/shower;Yadira Hada - 2 wheels;Cane - single point      Prior Function Level of Independence: Independent with assistive device(s)         Comments: independent with ADLs and iADLs. Uses RW for community ambulation.     Hand Dominance        Extremity/Trunk Assessment   Upper Extremity Assessment Upper Extremity Assessment: Defer to OT evaluation    Lower Extremity Assessment Lower Extremity Assessment: Generalized weakness    Cervical / Trunk Assessment Cervical / Trunk Assessment: Normal  Communication   Communication: No difficulties  Cognition Arousal/Alertness: Awake/alert Behavior During Therapy: WFL for tasks assessed/performed Overall Cognitive Status: Impaired/Different from baseline Area of Impairment: Memory                     Memory: Decreased short-term memory  General Comments: Providing direction instructions requiring repeated cues throughout for reminders      General Comments      Exercises     Assessment/Plan    PT Assessment Patient needs continued PT services  PT Problem List Decreased balance;Decreased activity tolerance;Decreased strength       PT Treatment  Interventions DME instruction;Gait training;Therapeutic activities;Therapeutic exercise;Balance training;Patient/family education    PT Goals (Current goals can be found in the Care Plan section)  Acute Rehab PT Goals Patient Stated Goal: to go home PT Goal Formulation: With patient Time For Goal Achievement: 02/03/21 Potential to Achieve Goals: Good    Frequency Min 3X/week   Barriers to discharge        Co-evaluation               AM-PAC PT "6 Clicks" Mobility  Outcome Measure Help needed turning from your back to your side while in a flat bed without using bedrails?: None Help needed moving from lying on your back to sitting on the side of a flat bed without using bedrails?: None Help needed moving to and from a bed to a chair (including a wheelchair)?: None Help needed standing up from a chair using your arms (e.g., wheelchair or bedside chair)?: A Little Help needed to walk in hospital room?: A Little Help needed climbing 3-5 steps with a railing? : A Little 6 Click Score: 21    End of Session Equipment Utilized During Treatment: Gait belt Activity Tolerance: Patient tolerated treatment well Patient left: in bed;with call bell/phone within reach;with bed alarm set Nurse Communication: Mobility status PT Visit Diagnosis: Unsteadiness on feet (R26.81);Muscle weakness (generalized) (M62.81)    Time: 0865-7846 PT Time Calculation (min) (ACUTE ONLY): 24 min   Charges:   PT Evaluation $PT Eval Low Complexity: 1 Low PT Treatments $Therapeutic Activity: 8-22 mins        Waneta Fitting A. Dan Humphreys PT, DPT Acute Rehabilitation Services Pager [phone] Office [phone]   Viviann Spare 01/20/2021, 9:22 AM

## 2021-01-20 NOTE — Care Management Obs Status (Signed)
[place] NOTIFICATION   Patient Details  Name: Raymond [name] MRN: [id number] Date of Birth: Apr 06, 1956   Medicare Observation Status Notification Given:  Yes    Angelita Ingles, RN 01/20/2021, 1:07 PM

## 2021-01-21 ENCOUNTER — Telehealth: Payer: Self-pay | Admitting: Nephrology

## 2021-01-21 NOTE — Telephone Encounter (Signed)
Transition of Care Contact from [place]  Date of Discharge: 01/20/21 Date of Contact: 01/21/21 Method of contact: phone - attempted  Attempted to contact patient to discuss transition of care from inpatient admission.  Patient did not answer the phone.  Message was left on patient's voicemail informing them we would attempt to call them again and if unable to reach will follow up at dialysis.  Jen Mow, PA-C [facility] Pager: [phone]

## 2021-01-25 ENCOUNTER — Emergency Department (HOSPITAL_COMMUNITY): Payer: 59

## 2021-01-25 ENCOUNTER — Inpatient Hospital Stay (HOSPITAL_COMMUNITY)
Admission: EM | Admit: 2021-01-25 | Discharge: 2021-01-29 | DRG: 377 | Disposition: A | Discharge status: Home / Self Care | Payer: 59 | Attending: Internal Medicine | Admitting: Internal Medicine

## 2021-01-25 DIAGNOSIS — F17210 Nicotine dependence, cigarettes, uncomplicated: Secondary | ICD-10-CM | POA: Diagnosis present

## 2021-01-25 DIAGNOSIS — N2581 Secondary hyperparathyroidism of renal origin: Secondary | ICD-10-CM | POA: Diagnosis present

## 2021-01-25 DIAGNOSIS — Z833 Family history of diabetes mellitus: Secondary | ICD-10-CM

## 2021-01-25 DIAGNOSIS — Z8616 Personal history of COVID-19: Secondary | ICD-10-CM

## 2021-01-25 DIAGNOSIS — Z7901 Long term (current) use of anticoagulants: Secondary | ICD-10-CM

## 2021-01-25 DIAGNOSIS — I2510 Atherosclerotic heart disease of native coronary artery without angina pectoris: Secondary | ICD-10-CM | POA: Diagnosis present

## 2021-01-25 DIAGNOSIS — I480 Paroxysmal atrial fibrillation: Secondary | ICD-10-CM | POA: Diagnosis present

## 2021-01-25 DIAGNOSIS — I132 Hypertensive heart and chronic kidney disease with heart failure and with stage 5 chronic kidney disease, or end stage renal disease: Secondary | ICD-10-CM | POA: Diagnosis present

## 2021-01-25 DIAGNOSIS — Z8249 Family history of ischemic heart disease and other diseases of the circulatory system: Secondary | ICD-10-CM | POA: Diagnosis not present

## 2021-01-25 DIAGNOSIS — D649 Anemia, unspecified: Secondary | ICD-10-CM | POA: Diagnosis not present

## 2021-01-25 DIAGNOSIS — F1410 Cocaine abuse, uncomplicated: Secondary | ICD-10-CM | POA: Diagnosis present

## 2021-01-25 DIAGNOSIS — N186 End stage renal disease: Secondary | ICD-10-CM | POA: Diagnosis present

## 2021-01-25 DIAGNOSIS — Z992 Dependence on renal dialysis: Secondary | ICD-10-CM

## 2021-01-25 DIAGNOSIS — I5032 Chronic diastolic (congestive) heart failure: Secondary | ICD-10-CM | POA: Diagnosis present

## 2021-01-25 DIAGNOSIS — I739 Peripheral vascular disease, unspecified: Secondary | ICD-10-CM | POA: Diagnosis present

## 2021-01-25 DIAGNOSIS — Z7902 Long term (current) use of antithrombotics/antiplatelets: Secondary | ICD-10-CM | POA: Diagnosis not present

## 2021-01-25 DIAGNOSIS — J449 Chronic obstructive pulmonary disease, unspecified: Secondary | ICD-10-CM | POA: Diagnosis present

## 2021-01-25 DIAGNOSIS — D61818 Other pancytopenia: Secondary | ICD-10-CM | POA: Diagnosis present

## 2021-01-25 DIAGNOSIS — Z79899 Other long term (current) drug therapy: Secondary | ICD-10-CM | POA: Diagnosis not present

## 2021-01-25 DIAGNOSIS — K31819 Angiodysplasia of stomach and duodenum without bleeding: Secondary | ICD-10-CM | POA: Diagnosis not present

## 2021-01-25 DIAGNOSIS — R008 Other abnormalities of heart beat: Secondary | ICD-10-CM | POA: Diagnosis not present

## 2021-01-25 DIAGNOSIS — F1010 Alcohol abuse, uncomplicated: Secondary | ICD-10-CM | POA: Diagnosis present

## 2021-01-25 DIAGNOSIS — K31811 Angiodysplasia of stomach and duodenum with bleeding: Principal | ICD-10-CM | POA: Diagnosis present

## 2021-01-25 DIAGNOSIS — D62 Acute posthemorrhagic anemia: Secondary | ICD-10-CM | POA: Diagnosis not present

## 2021-01-25 DIAGNOSIS — K921 Melena: Secondary | ICD-10-CM | POA: Diagnosis not present

## 2021-01-25 LAB — BASIC METABOLIC PANEL
Anion gap: 15 (ref 5–15)
BUN: 43 mg/dL — ABNORMAL HIGH (ref 8–23)
CO2: 31 mmol/L (ref 22–32)
Calcium: 8.6 mg/dL — ABNORMAL LOW (ref 8.9–10.3)
Chloride: 95 mmol/L — ABNORMAL LOW (ref 98–111)
Creatinine, Ser: 9.22 mg/dL — ABNORMAL HIGH (ref 0.61–1.24)
GFR, Estimated: 6 mL/min — ABNORMAL LOW (ref >60)
Glucose, Bld: 76 mg/dL (ref 70–99)
Potassium: 3.8 mmol/L (ref 3.5–5.1)
Sodium: 141 mmol/L (ref 135–145)

## 2021-01-25 LAB — CBC
HCT: 20.4 % — ABNORMAL LOW (ref 39.0–52.0)
Hemoglobin: 6.1 g/dL — CL (ref 13.0–17.0)
MCH: 33.3 pg (ref 26.0–34.0)
MCHC: 29.9 g/dL — ABNORMAL LOW (ref 30.0–36.0)
MCV: 111.5 fL — ABNORMAL HIGH (ref 80.0–100.0)
Platelets: 123 10*3/uL — ABNORMAL LOW (ref 150–400)
RBC: 1.83 MIL/uL — ABNORMAL LOW (ref 4.22–5.81)
RDW: 18.6 % — ABNORMAL HIGH (ref 11.5–15.5)
WBC: 3.1 10*3/uL — ABNORMAL LOW (ref 4.0–10.5)
nRBC: 1 % — ABNORMAL HIGH (ref 0.0–0.2)

## 2021-01-25 LAB — CBC WITH DIFFERENTIAL/PLATELET
Abs Immature Granulocytes: 0.02 10*3/uL (ref 0.00–0.07)
Basophils Absolute: 0 10*3/uL (ref 0.0–0.1)
Basophils Relative: 0 %
Eosinophils Absolute: 0 10*3/uL (ref 0.0–0.5)
Eosinophils Relative: 1 %
HCT: 28.3 % — ABNORMAL LOW (ref 39.0–52.0)
Hemoglobin: 8.6 g/dL — ABNORMAL LOW (ref 13.0–17.0)
Immature Granulocytes: 1 %
Lymphocytes Relative: 23 %
Lymphs Abs: 0.6 10*3/uL — ABNORMAL LOW (ref 0.7–4.0)
MCH: 33.2 pg (ref 26.0–34.0)
MCHC: 30.4 g/dL (ref 30.0–36.0)
MCV: 109.3 fL — ABNORMAL HIGH (ref 80.0–100.0)
Monocytes Absolute: 0.5 10*3/uL (ref 0.1–1.0)
Monocytes Relative: 19 %
Neutro Abs: 1.5 10*3/uL — ABNORMAL LOW (ref 1.7–7.7)
Neutrophils Relative %: 56 %
Platelets: 121 10*3/uL — ABNORMAL LOW (ref 150–400)
RBC: 2.59 MIL/uL — ABNORMAL LOW (ref 4.22–5.81)
RDW: 18.4 % — ABNORMAL HIGH (ref 11.5–15.5)
WBC: 2.7 10*3/uL — ABNORMAL LOW (ref 4.0–10.5)
nRBC: 1.1 % — ABNORMAL HIGH (ref 0.0–0.2)

## 2021-01-25 LAB — TROPONIN I (HIGH SENSITIVITY): Troponin I (High Sensitivity): 55 ng/L — ABNORMAL HIGH (ref <18)

## 2021-01-25 LAB — VITAMIN B12: Vitamin B-12: 1212 pg/mL — ABNORMAL HIGH (ref 180–914)

## 2021-01-25 LAB — MAGNESIUM: Magnesium: 2.1 mg/dL (ref 1.7–2.4)

## 2021-01-25 LAB — RESP PANEL BY RT-PCR (FLU A&B, COVID) ARPGX2
Influenza A by PCR: NEGATIVE
Influenza B by PCR: NEGATIVE
SARS Coronavirus 2 by RT PCR: NEGATIVE

## 2021-01-25 LAB — PREPARE RBC (CROSSMATCH)

## 2021-01-25 LAB — TSH: TSH: 4.029 u[IU]/mL (ref 0.350–4.500)

## 2021-01-25 LAB — POC OCCULT BLOOD, ED: Fecal Occult Bld: NEGATIVE

## 2021-01-25 IMAGING — DX DG CHEST 1V PORT
1 series · 1 of 1 positions shown · non-contrast
Comparison: [DATE]

CLINICAL DATA: Weakness.  On dialysis.

EXAM:
PORTABLE CHEST 1 VIEW

[chest ap]
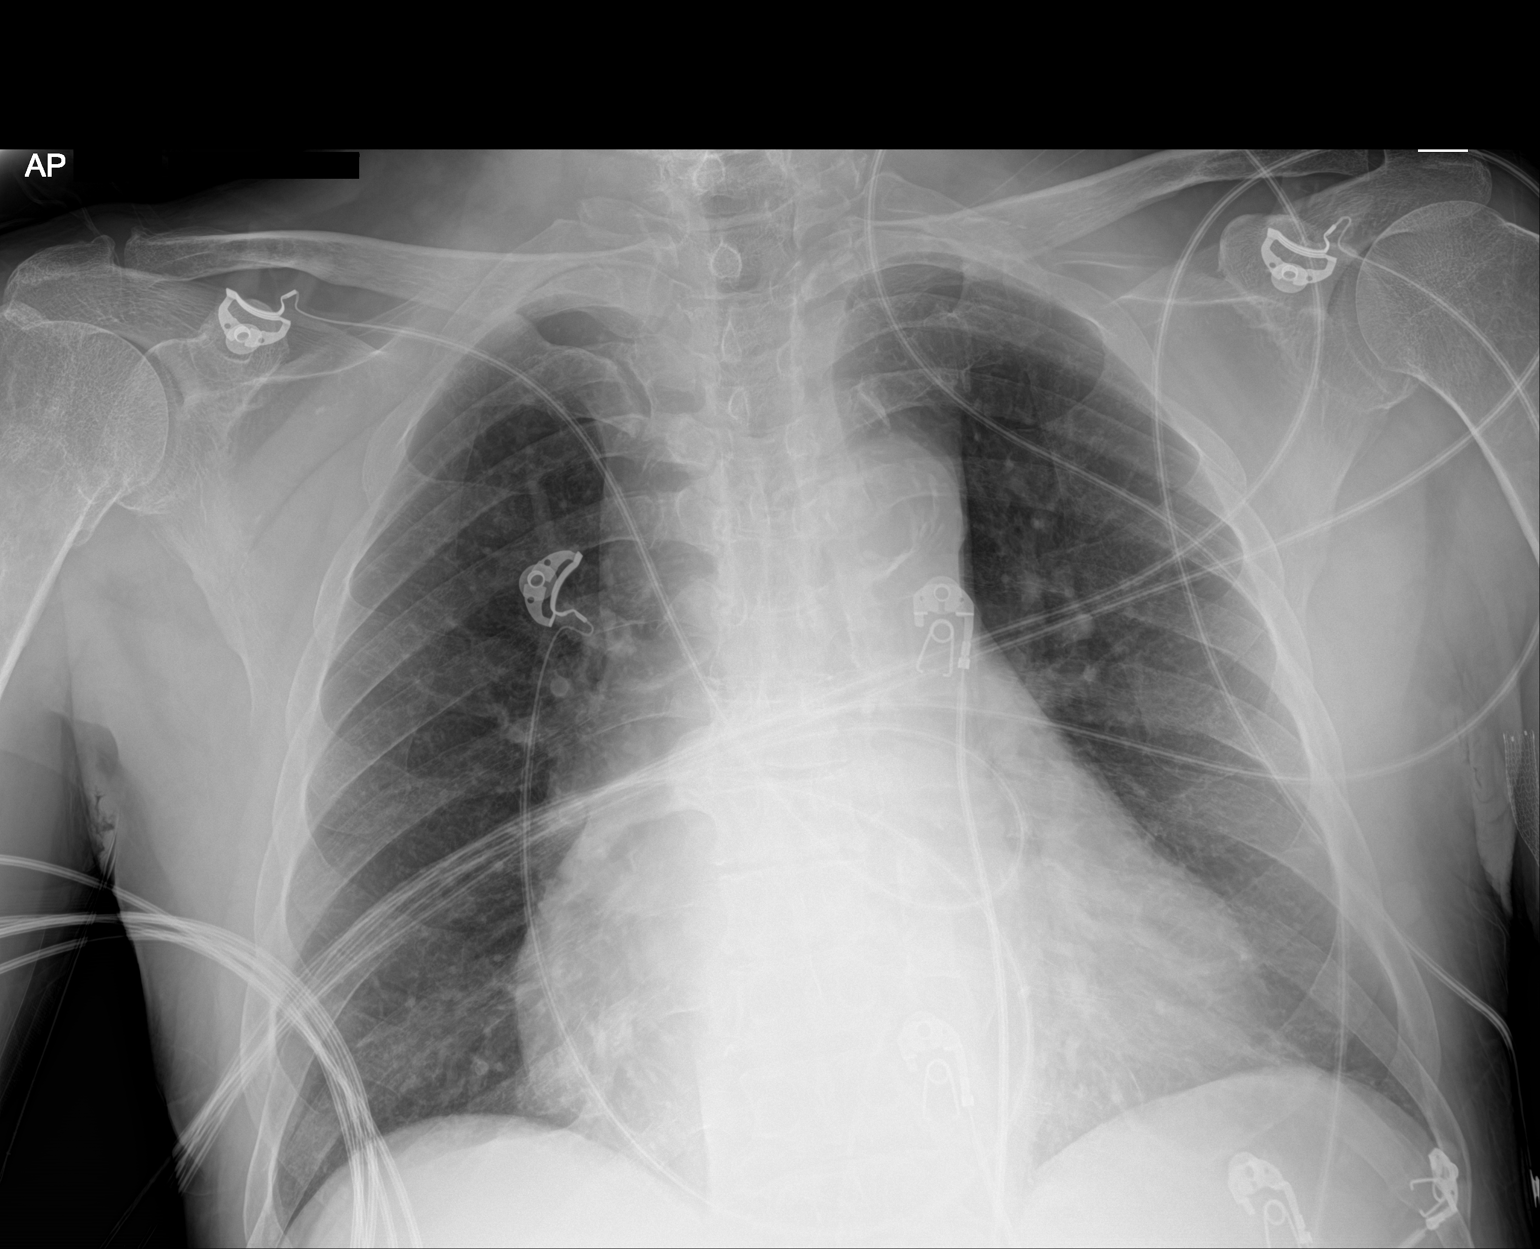

[1 of 1 positions shown; findings below may reference images not displayed]

FINDINGS: The cardiac silhouette remains moderately enlarged, unchanged.
Aortic atherosclerosis is noted. No airspace consolidation, edema,
pleural effusion, pneumothorax is identified. No acute osseous
abnormality is seen.
IMPRESSION: No active disease.

## 2021-01-25 MED ORDER — CARVEDILOL 6.25 MG PO TABS
6.2500 mg | ORAL_TABLET | Freq: Two times a day (BID) | ORAL | Status: DC
  Administered 2021-01-25 – 2021-01-29 (×5): 6.25 mg via ORAL
  Filled 2021-01-25 (×3): qty 1
  Filled 2021-01-25: qty 2
  Filled 2021-01-25 (×2): qty 1

## 2021-01-25 MED ORDER — PANTOPRAZOLE SODIUM 40 MG IV SOLR
40.0000 mg | Freq: Two times a day (BID) | INTRAVENOUS | Status: DC
  Administered 2021-01-25: 40 mg via INTRAVENOUS
  Filled 2021-01-25: qty 40

## 2021-01-25 MED ORDER — ALBUTEROL SULFATE (2.5 MG/3ML) 0.083% IN NEBU: 3.0000 mL | INHALATION_SOLUTION | RESPIRATORY_TRACT | Status: DC | PRN

## 2021-01-25 MED ORDER — THIAMINE HCL 100 MG PO TABS
100.0000 mg | ORAL_TABLET | Freq: Every day | ORAL | Status: DC
  Administered 2021-01-25 – 2021-01-29 (×5): 100 mg via ORAL
  Filled 2021-01-25 (×5): qty 1

## 2021-01-25 MED ORDER — ACETAMINOPHEN 325 MG PO TABS: 650.0000 mg | ORAL_TABLET | Freq: Four times a day (QID) | ORAL | Status: DC | PRN

## 2021-01-25 MED ORDER — ROSUVASTATIN CALCIUM 5 MG PO TABS
10.0000 mg | ORAL_TABLET | Freq: Every day | ORAL | Status: DC
  Administered 2021-01-26 – 2021-01-29 (×4): 10 mg via ORAL
  Filled 2021-01-25 (×4): qty 2

## 2021-01-25 MED ORDER — POLYETHYLENE GLYCOL 3350 17 G PO PACK: 17.0000 g | PACK | Freq: Every day | ORAL | Status: DC | PRN

## 2021-01-25 MED ORDER — SEVELAMER CARBONATE 800 MG PO TABS: 4000.0000 mg | ORAL_TABLET | ORAL | Status: DC | PRN

## 2021-01-25 MED ORDER — LORAZEPAM 1 MG PO TABS
1.0000 mg | ORAL_TABLET | ORAL | Status: AC | PRN
  Administered 2021-01-25: 2 mg via ORAL
  Administered 2021-01-26: 1 mg via ORAL
  Filled 2021-01-25: qty 2
  Filled 2021-01-25: qty 1

## 2021-01-25 MED ORDER — FOLIC ACID 1 MG PO TABS
1.0000 mg | ORAL_TABLET | Freq: Every day | ORAL | Status: DC
  Administered 2021-01-25 – 2021-01-29 (×5): 1 mg via ORAL
  Filled 2021-01-25 (×5): qty 1

## 2021-01-25 MED ORDER — THIAMINE HCL 100 MG/ML IJ SOLN: 100.0000 mg | Freq: Every day | INTRAMUSCULAR | Status: DC

## 2021-01-25 MED ORDER — AMIODARONE HCL 200 MG PO TABS
200.0000 mg | ORAL_TABLET | Freq: Every day | ORAL | Status: DC
  Administered 2021-01-26 – 2021-01-29 (×4): 200 mg via ORAL
  Filled 2021-01-25 (×4): qty 1

## 2021-01-25 MED ORDER — ADULT MULTIVITAMIN W/MINERALS CH
1.0000 | ORAL_TABLET | Freq: Every day | ORAL | Status: DC
  Administered 2021-01-25 – 2021-01-29 (×5): 1 via ORAL
  Filled 2021-01-25 (×5): qty 1

## 2021-01-25 MED ORDER — ACETAMINOPHEN 650 MG RE SUPP: 650.0000 mg | Freq: Four times a day (QID) | RECTAL | Status: DC | PRN

## 2021-01-25 MED ORDER — LORAZEPAM 2 MG/ML IJ SOLN: 1.0000 mg | INTRAMUSCULAR | Status: AC | PRN

## 2021-01-25 MED ORDER — AMLODIPINE BESYLATE 5 MG PO TABS
2.5000 mg | ORAL_TABLET | Freq: Every day | ORAL | Status: DC
  Administered 2021-01-26 – 2021-01-27 (×2): 2.5 mg via ORAL
  Filled 2021-01-25 (×2): qty 1

## 2021-01-25 MED ORDER — SEVELAMER CARBONATE 800 MG PO TABS
3200.0000 mg | ORAL_TABLET | Freq: Three times a day (TID) | ORAL | Status: DC
  Administered 2021-01-25 – 2021-01-29 (×7): 3200 mg via ORAL
  Filled 2021-01-25 (×7): qty 4

## 2021-01-25 MED ORDER — SODIUM CHLORIDE 0.9% IV SOLUTION: Freq: Once | INTRAVENOUS | Status: AC

## 2021-01-25 NOTE — H&P (Signed)
History and Physical    Raymond [name] ZOX:[id number] DOB: 01/23/1956 DOA: 01/25/2021  PCP: Raymond [name]., FNP  Patient coming from: home  I have personally briefly reviewed patient's old medical records in [facility]  Chief Complaint: shortness of breath, malaise  HPI: Raymond [name] is Raymond [name] 65 y.o. male with medical history significant of ESRD, distal small bowel AVM's, CAD, COPD, HTN and multiple other medical problems presenting with dyspnea on exertion and generalized weakness.  He was recently discharged on 7/13 after being hospitalized with confusion that was thought to be related to low blood sugars.  He notes when he got home he realized he left his phone and got Raymond [name] ride back.  When he was walking down the hall, he was weak and tired and had to stop several times with shortness of breath.  He also notes that he felt poorly after dialysis Thursday - weak, couldn't walk far - felt like he'd been running, legs heavy and weak.  Felt better after eating candy.  Saturday he notes he was told his blood counts were low and advised to come to the hospital.  He didn't come in, but today, went to PCP office and got blood draw and was found to be anemic and they directed him to the ED via EMS.  Notes smoking, etoh, cocaine use.    ED Course: Labs, anemia -> admit for transfusion   Review of Systems: As per HPI otherwise all other systems reviewed and are negative.  Past Medical History:  Diagnosis Date   Anemia    CAD (coronary artery disease)    Bhargav Barbaro. 03/2018: cath showing 20 to 30% stenosis along the LAD, luminal irregularities along the RCA, and angiographically normal LCx   COPD (chronic obstructive pulmonary disease) (HCC)    ESRD (end stage renal disease) on dialysis [facility])    "MWF; Rudene Anda" (07/22/17)   Hepatitis C    Still positive s/p liver biopsy at [facility]  and interferon therapy for 6 months. Most recent lab work was on 10/24/12   Hepatitis C    "took the tx; gone now"  (12/05/2016)  Was treated   History of blood transfusion ~ 2012/2013   "related to my kidneys; blood was low"   Hypertension    Peripheral arterial disease (HCC)    Substance abuse (HCC)    Thyroid disease     Past Surgical History:  Procedure Laterality Date   ABDOMINAL AORTOGRAM W/LOWER EXTREMITY N/Chaney Maclaren 02/08/2018   Procedure: ABDOMINAL AORTOGRAM W/LOWER EXTREMITY;  Surgeon: Fransisco Hertz, MD;  Location: [facility] INVASIVE CV LAB;  Service: Cardiovascular;  Laterality: N/Fatina Sprankle;   ABDOMINAL AORTOGRAM W/LOWER EXTREMITY Bilateral 06/15/2020   Procedure: ABDOMINAL AORTOGRAM W/LOWER EXTREMITY;  Surgeon: Maeola Harman, MD;  Location: [facility] INVASIVE CV LAB;  Service: Cardiovascular;  Laterality: Bilateral;   AV FISTULA PLACEMENT Left Aug. 2013 ?   BIOPSY  01/17/2020   Procedure: BIOPSY;  Surgeon: Lynann Bologna, MD;  Location: [facility] ENDOSCOPY;  Service: Endoscopy;;   COLONOSCOPY WITH PROPOFOL N/Yui Mulvaney 08/21/2018   Procedure: COLONOSCOPY WITH PROPOFOL;  Surgeon: Benancio Deeds, MD;  Location: [facility] ENDOSCOPY;  Service: Gastroenterology;  Laterality: N/Chay Mazzoni;   ENTEROSCOPY N/Zephaniah Lubrano 01/17/2020   Procedure: ENTEROSCOPY;  Surgeon: Lynann Bologna, MD;  Location: [facility] ENDOSCOPY;  Service: Endoscopy;  Laterality: N/Clent Damore;   ENTEROSCOPY N/Ina Poupard 07/31/2020   Procedure: ENTEROSCOPY;  Surgeon: Sherrilyn Rist, MD;  Location: [facility] ENDOSCOPY;  Service: Gastroenterology;  Laterality: N/Euclide Granito;   ESOPHAGOGASTRODUODENOSCOPY (EGD) WITH  PROPOFOL N/Sharlene Mccluskey 08/20/2018   Procedure: ESOPHAGOGASTRODUODENOSCOPY (EGD) WITH PROPOFOL;  Surgeon: Iva Boop, MD;  Location: [facility] ENDOSCOPY;  Service: Endoscopy;  Laterality: N/Brittnae Aschenbrenner;   FEMORAL-POPLITEAL BYPASS GRAFT Left 04/11/2018   Procedure: BYPASS GRAFT FEMORAL-POPLITEAL ARTERY LEFT LEG;  Surgeon: Larina Earthly, MD;  Location: [facility] OR;  Service: Vascular;  Laterality: Left;   GIVENS CAPSULE STUDY N/Ameliya Nicotra 07/31/2020   Procedure: GIVENS CAPSULE STUDY;  Surgeon: Sherrilyn Rist, MD;  Location: [facility] ENDOSCOPY;  Service:  Gastroenterology;  Laterality: N/Noa Constante;   HOT HEMOSTASIS N/Ingri Diemer 08/20/2018   Procedure: HOT HEMOSTASIS (ARGON PLASMA COAGULATION/BICAP);  Surgeon: Iva Boop, MD;  Location: [facility] ENDOSCOPY;  Service: Endoscopy;  Laterality: N/Yovana Scogin;   HOT HEMOSTASIS N/Mariesha Venturella 01/17/2020   Procedure: HOT HEMOSTASIS (ARGON PLASMA COAGULATION/BICAP);  Surgeon: Lynann Bologna, MD;  Location: [facility] ENDOSCOPY;  Service: Endoscopy;  Laterality: N/Anniya Whiters;   LEFT HEART CATH AND CORONARY ANGIOGRAPHY N/Deretha Ertle 03/29/2018   Procedure: LEFT HEART CATH AND CORONARY ANGIOGRAPHY;  Surgeon: Yvonne Kendall, MD;  Location: [facility] INVASIVE CV LAB;  Service: Cardiovascular;  Laterality: N/Leanette Eutsler;   LIVER BIOPSY     ORIF ULNAR FRACTURE Left 05/18/2017   Procedure: OPEN REDUCTION INTERNAL FIXATION (ORIF) ULNAR FRACTURE;  Surgeon: Bradly Bienenstock, MD;  Location: [facility] OR;  Service: Orthopedics;  Laterality: Left;   PERIPHERAL VASCULAR INTERVENTION Right 06/15/2020   Procedure: PERIPHERAL VASCULAR INTERVENTION;  Surgeon: Maeola Harman, MD;  Location: [facility] INVASIVE CV LAB;  Service: Cardiovascular;  Laterality: Right;   POLYPECTOMY  08/21/2018   Procedure: POLYPECTOMY;  Surgeon: Benancio Deeds, MD;  Location: [facility] ENDOSCOPY;  Service: Gastroenterology;;   Betsey Amen N/Jaire Pinkham 12/11/2012   Procedure: Betsey Amen;  Surgeon: Nada Libman, MD;  Location: [facility] CATH LAB;  Service: Cardiovascular;  Laterality: N/Nobuo Nunziata;   WOUND EXPLORATION Left 06/15/2020   Procedure: GROIN  EXPLORATION;  Surgeon: Maeola Harman, MD;  Location: [facility] OR;  Service: Vascular;  Laterality: Left;    Social History  reports that he has been smoking cigarettes. He has Alyssa Rotondo 5.00 pack-year smoking history. He has never used smokeless tobacco. He reports current alcohol use of about 10.0 standard drinks of alcohol per week. He reports previous drug use. Drug: Cocaine.  No Known Allergies  Family History  Problem Relation Age of Onset   Diabetes Father    Hypertension Father    Heart disease Father     Prior to Admission medications   Medication Sig Start Date End Date Taking? Authorizing Provider  acetaminophen (TYLENOL) 325 MG tablet Take 650 mg by mouth every 6 (six) hours as needed for mild pain.    [provider]  albuterol (VENTOLIN HFA) 108 (90 Base) MCG/ACT inhaler Inhale 2 puffs into the lungs every 4 (four) hours as needed for shortness of breath or wheezing. 11/06/19   [provider]  amiodarone (PACERONE) 200 MG tablet Take 1 tablet (200 mg total) by mouth daily. 12/11/20   Almon Hercules, MD  calcitRIOL (ROCALTROL) 0.25 MCG capsule Take 9 capsules (2.25 mcg total) by mouth Every Tuesday,Thursday,and Saturday with dialysis. 12/12/20   Almon Hercules, MD  carvedilol (COREG) 6.25 MG tablet Take 1 tablet (6.25 mg total) by mouth 2 (two) times daily with Beula Joyner meal. 12/11/20   Almon Hercules, MD  cinacalcet (SENSIPAR) 30 MG tablet Take 6 tablets (180 mg total) by mouth Every Tuesday,Thursday,and Saturday with dialysis. 12/12/20   Almon Hercules, MD  clopidogrel (PLAVIX) 75 MG tablet TAKE 1 TABLET (75 MG TOTAL) BY MOUTH DAILY. Patient  not taking: Reported on 12/09/2020 06/22/20 06/22/21  Baglia, Corrina, PA-C  docusate sodium (COLACE) 100 MG capsule Take 100 mg by mouth daily.    [provider]  folic acid (FOLVITE) 1 MG tablet Take 1 tablet (1 mg total) by mouth daily. 12/12/20   Almon Hercules, MD  Multiple Vitamin (MULTIVITAMIN WITH MINERALS) TABS tablet Take 1 tablet by mouth daily. 12/12/20   Almon Hercules, MD  nicotine (NICODERM CQ - DOSED IN MG/24 HOURS) 14 mg/24hr patch Place 1 patch (14 mg total) onto the skin daily. 12/12/20   Almon Hercules, MD  pantoprazole (PROTONIX) 40 MG tablet Take 40 mg by mouth daily.    [provider]  prochlorperazine (COMPAZINE) 5 MG tablet Take 1 tablet (5 mg total) by mouth every 8 (eight) hours as needed for nausea or vomiting. 12/11/20   Almon Hercules, MD  rosuvastatin (CRESTOR) 10 MG tablet Take 1 tablet (10 mg total) by mouth daily.  12/11/20 06/09/21  Almon Hercules, MD  sevelamer carbonate (RENVELA) 800 MG tablet Take 4 tablets (3,200 mg total) by mouth 3 (three) times daily with meals. Patient taking differently: Take 4,000 mg by mouth 3 (three) times daily with meals. And take 4000 mg with snacks. 09/14/18   Susa Griffins, MD  thiamine 100 MG tablet Take 1 tablet (100 mg total) by mouth daily. 12/12/20   Almon Hercules, MD  apixaban (ELIQUIS) 5 MG TABS tablet Take 1 tablet (5 mg total) by mouth 2 (two) times daily. 06/22/20 08/03/20  Graceann Congress, PA-C    Physical Exam: Vitals:   01/25/21 1530 01/25/21 1545 01/25/21 1630 01/25/21 1634  BP: 123/74 122/82 115/73   Pulse: 74 73 73 81  Resp: 15 15 16    Temp:    98.3 F (36.8 C)  TempSrc:    Oral  SpO2: 100% 100% 99% 99%  Weight:      Height:        Constitutional: NAD, calm, comfortable Vitals:   01/25/21 1530 01/25/21 1545 01/25/21 1630 01/25/21 1634  BP: 123/74 122/82 115/73   Pulse: 74 73 73 81  Resp: 15 15 16    Temp:    98.3 F (36.8 C)  TempSrc:    Oral  SpO2: 100% 100% 99% 99%  Weight:      Height:       Eyes: PERRL, lids and conjunctivae normal ENMT: Mucous membranes are moist. Posterior pharynx clear of any exudate or lesions.Normal dentition.  Neck: normal, supple, no masses, no thyromegaly Respiratory: clear to auscultation bilaterally Cardiovascular: Regular rate and rhythm, no murmurs / rubs / gallops. No extremity edema. Abdomen: no tenderness, no masses palpated. Musculoskeletal: no clubbing / cyanosis. No joint deformity upper and lower extremities. Good ROM, no contractures. Normal muscle tone.  Skin: no rashes, lesions, ulcers. No induration Neurologic: CN 2-12 grossly intact. Moving all extremities. Psychiatric: Normal judgment and insight. Alert and oriented x 3. Normal mood.   Labs on Admission: I have personally reviewed following labs and imaging studies  CBC: Recent Labs  Lab 01/19/21 1729 01/20/21 0327 01/25/21 1146  01/25/21 1600  WBC 1.8* 1.8* 2.7* 3.1*  NEUTROABS 1.0*  --  1.5*  --   HGB 7.3* 7.5* 8.6* 6.1*  HCT 24.0* 24.4* 28.3* 20.4*  MCV 107.6* 105.2* 109.3* 111.5*  PLT 83* 107* 121* 123*    Basic Metabolic Panel: Recent Labs  Lab 01/19/21 1729 01/20/21 0327 01/25/21 1146  NA 135 135 141  K 3.8 3.7  3.8  CL 95* 93* 95*  CO2 30 33* 31  GLUCOSE 127* 190* 76  BUN 54* 54* 43*  CREATININE 7.49* 8.04* 9.22*  CALCIUM 7.8* 8.0* 8.6*  MG  --   --  2.1  PHOS  --  5.6*  --     GFR: Estimated Creatinine Clearance: 7.5 mL/min (Virga Haltiwanger) (by C-G formula based on SCr of 9.22 mg/dL (H)).  Liver Function Tests: Recent Labs  Lab 01/19/21 1729 01/20/21 0327  AST 12*  --   ALT 8  --   ALKPHOS 52  --   BILITOT 0.9  --   PROT 7.1  --   ALBUMIN 3.2* 3.1*    Urine analysis:    Component Value Date/Time   COLORURINE YELLOW 05/08/2010 1335   APPEARANCEUR CLEAR 05/08/2010 1335   LABSPEC 1.009 05/08/2010 1335   PHURINE 6.0 05/08/2010 1335   GLUCOSEU NEGATIVE 05/08/2010 1335   HGBUR NEGATIVE 05/08/2010 1335   BILIRUBINUR NEGATIVE 05/08/2010 1335   KETONESUR NEGATIVE 05/08/2010 1335   PROTEINUR 100 (Ellanora Rayborn) 05/08/2010 1335   UROBILINOGEN 0.2 05/08/2010 1335   NITRITE NEGATIVE 05/08/2010 1335   LEUKOCYTESUR SMALL (Abednego Yeates) 05/08/2010 1335    Radiological Exams on Admission: DG Chest Portable 1 View  Result Date: 01/25/2021 CLINICAL DATA:  Weakness.  On dialysis. EXAM: PORTABLE CHEST 1 VIEW COMPARISON:  01/19/2021 FINDINGS: The cardiac silhouette remains moderately enlarged, unchanged. Aortic atherosclerosis is noted. No airspace consolidation, edema, pleural effusion, pneumothorax is identified. No acute osseous abnormality is seen. IMPRESSION: No active disease. Electronically Signed   By: Sebastian Ache M.D.   On: 01/25/2021 12:54    EKG: Independently reviewed. Atrial flutter  Assessment/Plan Active Problems:   Anemia   Symptomatic anemia  Symptomatic Anemia  Suspected Acute Blood Loss Anemia 2/2  GI Bleed  Shortness of Breath and Chest Pain with Exertion  Generalized Weakness Hb 5.8 in clinic today [facility]), initially 8.6 here Repeat 6.1 He reports SOB and chest discomfort with exertion in addition to generalized weakness Hemoccult negative, no reported melena (he notes 1 week ago dark stool) Capsule endoscopy in 1/21 with distal small bowel AVM's - he's not taking aspirin or plavix - not taking any anticoagulation  PPI BID Transfuse 1 unit and follow  Hemoccult negative, but given reported melena about Breauna Mazzeo week or so ago - will c/s GI   Paroxysmal Atrial Fibrillation Currently in flutter Continue home amiodarone, coreg  Pancytopenia Follow iron, b12, folate, ferritin.  Follow smear.   Diastolic HF Symptoms all likely related to anemia, follow repeat echo   Tobacco Use Disorder Encouraged cessation  Cocaine Use Disorder Encouraged cessation  Alcohol Use CIWA  ESRD HD per renal  DVT prophylaxis: SCD  Code Status:   full  Family Communication:  None at bedside Disposition Plan:   Patient is from:  home  Anticipated DC to:  home  Anticipated DC date:  Pending improvement in Hb  Anticipated DC barriers: Symptomatic anemia  Consults called:  Renal, GI  Admission status:  inpatient  Severity of Illness: The appropriate patient status for this patient is INPATIENT. Inpatient status is judged to be reasonable and necessary in order to provide the required intensity of service to ensure the patient's safety. The patient's presenting symptoms, physical exam findings, and initial radiographic and laboratory data in the context of their chronic comorbidities is felt to place them at high risk for further clinical deterioration. Furthermore, it is not anticipated that the patient will be medically stable for discharge  from the hospital within 2 midnights of admission. The following factors support the patient status of inpatient.   " The patient's presenting  symptoms include dyspnea on exertion, symptomatic anemia. " The initial radiographic and laboratory data are worrisome because of Hb 6.1.   * I certify that at the point of admission it is my clinical judgment that the patient will require inpatient hospital care spanning beyond 2 midnights from the point of admission due to high intensity of service, high risk for further deterioration and high frequency of surveillance required.Lacretia Nicks MD [facility]  How to contact the [facility] Attending or Consulting provider 7A - 7P or covering provider during after hours 7P -7A, for this patient?   Check the care team in [facility] and look for Jaylnn Ullery) attending/consulting [facility] provider listed and b) the [facility] team listed Log into [email or web address] and use [place]'s universal password to access. If you do not have the password, please contact the hospital operator. Locate the [facility] provider you are looking for under [facility] and page to [place] number that you can be directly reached. If you still have difficulty reaching the provider, please page the [facility] (Director on Call) for the Hospitalists listed on amion for assistance.  01/25/2021, 5:10 PM

## 2021-01-25 NOTE — ED Notes (Signed)
Dinner tray ordered.

## 2021-01-25 NOTE — ED Provider Notes (Signed)
Care assumed from Dr. Regan Lemming at [facility], please see his note for full details, but in brief Raymond [name] is a 65 y.o. male who presents from [place] office with hemoglobin of 5.8 on blood work today.  Last dialyzed on Saturday, noted to have low hemoglobin then as well, only had about 30 minutes of dialysis treatment they encouraged him to go to the hospital at that time but he was feeling well.  Reports that while at home over the weekend he started to feel progressively weak and fatigued.  Reports he lives down the long hallway and had to stop several times to sit down because he felt like he was going to pass out.  Today had to lay down because he felt he was going to pass out.  Has had to have transfusions multiple times previously.  Blood pressure of 90/60 in office.  Plan: Discussed with nephrology, likely admit for symptomatic anemia  BP 115/73   Pulse 81   Temp 98.3 F (36.8 C) (Oral)   Resp 16   Ht 5\' 8"  (1.727 m)   Wt 71.6 kg   SpO2 99%   BMI 24.00 kg/m    ED Course/Procedures   Labs Reviewed  CBC WITH DIFFERENTIAL/PLATELET - Abnormal; Notable for the following components:      Result Value   WBC 2.7 (*)    RBC 2.59 (*)    Hemoglobin 8.6 (*)    HCT 28.3 (*)    MCV 109.3 (*)    RDW 18.4 (*)    Platelets 121 (*)    nRBC 1.1 (*)    Neutro Abs 1.5 (*)    Lymphs Abs 0.6 (*)    All other components within normal limits  BASIC METABOLIC PANEL - Abnormal; Notable for the following components:   Chloride 95 (*)    BUN 43 (*)    Creatinine, Ser 9.22 (*)    Calcium 8.6 (*)    GFR, Estimated 6 (*)    All other components within normal limits  RESP PANEL BY RT-PCR (FLU A&B, COVID) ARPGX2  MAGNESIUM  OCCULT BLOOD X 1 CARD TO LAB, STOOL  CBC  TSH  CBC  POC OCCULT BLOOD, ED  TYPE AND SCREEN  TROPONIN I (HIGH SENSITIVITY)    DG Chest Portable 1 View  Result Date: 01/25/2021 CLINICAL DATA:  Weakness.  On dialysis. EXAM: PORTABLE CHEST 1 VIEW COMPARISON:   01/19/2021 FINDINGS: The cardiac silhouette remains moderately enlarged, unchanged. Aortic atherosclerosis is noted. No airspace consolidation, edema, pleural effusion, pneumothorax is identified. No acute osseous abnormality is seen. IMPRESSION: No active disease. Electronically Signed   By: Logan Bores M.D.   On: 01/25/2021 12:54     .Critical Care  Date/Time: 01/25/2021 5:02 PM Performed by: Jacqlyn Larsen, PA-C Authorized by: Jacqlyn Larsen, PA-C   Critical care provider statement:    Critical care time (minutes):  45   Critical care was necessary to treat or prevent imminent or life-threatening deterioration of the following conditions:  Circulatory failure (Symptomatic anemia requiring transfusion.)   Critical care was time spent personally by me on the following activities:  Discussions with consultants, evaluation of patient's response to treatment, examination of patient, ordering and performing treatments and interventions, ordering and review of laboratory studies, ordering and review of radiographic studies, pulse oximetry, re-evaluation of patient's condition, obtaining history from patient or surrogate and review of old charts   Care discussed with: admitting provider    MDM  Hemoglobin here is improved at 8.6, Hemoccult is negative patient did report some dark stools about a week ago.  Patient is quite symptomatic with this and this is significantly different from his baseline hemoglobin and his hemoglobin in the office today.  We will recheck and check orthostatic vitals.  Dr. Armandina Gemma felt patient may need renal consult and potential admission for symptomatic anemia.  Case discussed with Dr. Marval Regal with Nephrology, recommends rechecking CBC.  May need admission for trending hemoglobin, due for dialysis tomorrow.  Repeat hemoglobin 6.1.  Case discussed with Dr. Florene Glen with [facility], he will see and admit patient, transfuse 1 unit and patient will be set up for  dialysis tomorrow.     Jacqlyn Larsen, PA-C 01/25/21 [place], DO 01/25/21 1708

## 2021-01-25 NOTE — Progress Notes (Signed)
Room ready. Report received.

## 2021-01-25 NOTE — ED Provider Notes (Signed)
[facility] EMERGENCY DEPARTMENT Provider Note   CSN: [id number] Arrival date & time: 01/25/21  1129     History Chief Complaint  Patient presents with   Abnormal Lab    Raymond [name] is a 65 y.o. male.   Abnormal Lab 65 year old male with a past medical history significant for HTN, PAD, CAD, small bowel AVMs, ESRD on HD TTS who was sent to the emergency department by his PCP due to concern for hemoglobin of 5.7.  Patient endorses significant dyspnea, lightheadedness, difficulty walking due to fatigue and lightheadedness.  He denies any chest pain.  He states that 1 week ago he endorsed multiple episodes of dark sticky stools but has had not had any blood in his bowel movements or dark sticky stools recently this week.    Past Medical History:  Diagnosis Date   Anemia    CAD (coronary artery disease)    a. 03/2018: cath showing 30 to 30% stenosis along the LAD, luminal irregularities along the RCA, and angiographically normal LCx   COPD (chronic obstructive pulmonary disease) (HCC)    ESRD (end stage renal disease) on dialysis [facility])    "MWF; Jeneen Rinks" (07/22/17)   Hepatitis C    Still positive s/p liver biopsy at [facility]  and interferon therapy for 6 months. Most recent lab work was on 10/24/12   Hepatitis C    "took the tx; gone now" (12/05/2016)  Was treated   History of blood transfusion ~ 2012/2013   "related to my kidneys; blood was low"   Hypertension    Peripheral arterial disease ([place])    Substance abuse ([place])    Thyroid disease     Patient Active Problem List   Diagnosis Date Noted   Symptomatic anemia 01/25/2021   AMS (altered mental status) 01/19/2021   Acute upper GI bleed 44/96/7591   Acute metabolic encephalopathy 63/84/6659   COVID-19 virus infection 07/29/2020   Gastric AVM    AVM (arteriovenous malformation) of small bowel, acquired with hemorrhage    Current use of long term anticoagulation    History of arteriovenous malformation  (AVM) 01/14/2020   UGIB (upper gastrointestinal bleed) 01/14/2020   Acute blood loss anemia 01/14/2020   Elevated troponin 01/14/2020   Gastroenteritis 12/24/2018   Substance use disorder 12/24/2018   Noncompliance of patient with renal dialysis ([place]) 09/12/2018   Benign neoplasm of colon    Melena    Anemia due to chronic kidney disease, on chronic dialysis ([place])    Anemia    H/O medication noncompliance    Polysubstance (excluding opioids) dependence ([place])    Atrial flutter with rapid ventricular response ([place]) 08/10/2018   PAF (paroxysmal atrial fibrillation) ([place]) 08/01/2018   Acute respiratory failure with hypoxia ([place]) 07/13/2018   Non-compliance with renal dialysis ([place]) 07/13/2018   PAD (peripheral artery disease) ([place]) 04/11/2018   Sepsis ([place]) 03/31/2018   Preop cardiovascular exam 03/29/2018   Volume overload 08/07/2017   Acute pulmonary edema (HCC)    Dyspnea 06/20/2017   Acute bronchitis    COPD (chronic obstructive pulmonary disease) ([place])    End-stage renal disease on hemodialysis ([place])    Hypoxia 12/05/2016   Hyperkalemia 12/19/2015   Hypoglycemia 12/19/2015   Polysubstance abuse ([place]) 12/19/2015   Homeless 12/19/2015   Anemia associated with stage 5 chronic renal failure ([place]) 12/19/2015   Atypical chest pain 12/19/2013   Atherosclerosis of native arteries of extremity with intermittent claudication ([place]) 12/04/2012   ESRD (end stage renal disease) (  [place]) 12/04/2012   HEPATITIS C 09/07/2006   HYPERCHOLESTEROLEMIA 09/07/2006   HYPERTENSION, BENIGN SYSTEMIC 09/07/2006    Past Surgical History:  Procedure Laterality Date   ABDOMINAL AORTOGRAM W/LOWER EXTREMITY N/A 02/08/2018   Procedure: ABDOMINAL AORTOGRAM W/LOWER EXTREMITY;  Surgeon: Conrad Hancocks Bridge, MD;  Location: [place] CV LAB;  Service: Cardiovascular;  Laterality: N/A;   ABDOMINAL AORTOGRAM W/LOWER EXTREMITY Bilateral 06/15/2020   Procedure: ABDOMINAL AORTOGRAM W/LOWER EXTREMITY;  Surgeon: Waynetta Sandy, MD;  Location: [place] CV LAB;  Service: Cardiovascular;  Laterality: Bilateral;   AV FISTULA PLACEMENT Left Aug. 2013 ?   BIOPSY  01/17/2020   Procedure: BIOPSY;  Surgeon: Jackquline Denmark, MD;  Location: [place];  Service: Endoscopy;;   COLONOSCOPY WITH PROPOFOL N/A 08/21/2018   Procedure: COLONOSCOPY WITH PROPOFOL;  Surgeon: Yetta Flock, MD;  Location: [place];  Service: Gastroenterology;  Laterality: N/A;   ENTEROSCOPY N/A 01/17/2020   Procedure: ENTEROSCOPY;  Surgeon: Jackquline Denmark, MD;  Location: [facility] ENDOSCOPY;  Service: Endoscopy;  Laterality: N/A;   ENTEROSCOPY N/A 07/31/2020   Procedure: ENTEROSCOPY;  Surgeon: Doran Stabler, MD;  Location: [place];  Service: Gastroenterology;  Laterality: N/A;   ESOPHAGOGASTRODUODENOSCOPY (EGD) WITH PROPOFOL N/A 08/20/2018   Procedure: ESOPHAGOGASTRODUODENOSCOPY (EGD) WITH PROPOFOL;  Surgeon: Gatha Mayer, MD;  Location: [place];  Service: Endoscopy;  Laterality: N/A;   FEMORAL-POPLITEAL BYPASS GRAFT Left 04/11/2018   Procedure: BYPASS GRAFT FEMORAL-POPLITEAL ARTERY LEFT LEG;  Surgeon: Rosetta Posner, MD;  Location: [facility] OR;  Service: Vascular;  Laterality: Left;   GIVENS CAPSULE STUDY N/A 07/31/2020   Procedure: GIVENS CAPSULE STUDY;  Surgeon: Doran Stabler, MD;  Location: [place];  Service: Gastroenterology;  Laterality: N/A;   HOT HEMOSTASIS N/A 08/20/2018   Procedure: HOT HEMOSTASIS (ARGON PLASMA COAGULATION/BICAP);  Surgeon: Gatha Mayer, MD;  Location: [facility] ENDOSCOPY;  Service: Endoscopy;  Laterality: N/A;   HOT HEMOSTASIS N/A 01/17/2020   Procedure: HOT HEMOSTASIS (ARGON PLASMA COAGULATION/BICAP);  Surgeon: Jackquline Denmark, MD;  Location: [facility] ENDOSCOPY;  Service: Endoscopy;  Laterality: N/A;   LEFT HEART CATH AND CORONARY ANGIOGRAPHY N/A 03/29/2018   Procedure: LEFT HEART CATH AND CORONARY ANGIOGRAPHY;  Surgeon: Nelva Bush, MD;  Location: [place] CV LAB;  Service: Cardiovascular;  Laterality:  N/A;   LIVER BIOPSY     ORIF ULNAR FRACTURE Left 05/18/2017   Procedure: OPEN REDUCTION INTERNAL FIXATION (ORIF) ULNAR FRACTURE;  Surgeon: Iran Planas, MD;  Location: [place];  Service: Orthopedics;  Laterality: Left;   PERIPHERAL VASCULAR INTERVENTION Right 06/15/2020   Procedure: PERIPHERAL VASCULAR INTERVENTION;  Surgeon: Waynetta Sandy, MD;  Location: [place] CV LAB;  Service: Cardiovascular;  Laterality: Right;   POLYPECTOMY  08/21/2018   Procedure: POLYPECTOMY;  Surgeon: Yetta Flock, MD;  Location: [place];  Service: Gastroenterology;;   Earney Mallet N/A 12/11/2012   Procedure: Earney Mallet;  Surgeon: Serafina Mitchell, MD;  Location: [facility] CATH LAB;  Service: Cardiovascular;  Laterality: N/A;   WOUND EXPLORATION Left 06/15/2020   Procedure: GROIN  EXPLORATION;  Surgeon: Waynetta Sandy, MD;  Location: [place];  Service: Vascular;  Laterality: Left;       Family History  Problem Relation Age of Onset   Diabetes Father    Hypertension Father    Heart disease Father     Social History   Tobacco Use   Smoking status: Every Day    Packs/day: 0.20    Years: 25.00    Pack years: 5.00  Types: Cigarettes    Last attempt to quit: 08/01/2011    Years since quitting: 9.4   Smokeless tobacco: Never   Tobacco comments:    he smokes 6 cigarettes a week  Vaping Use   Vaping Use: Never used  Substance Use Topics   Alcohol use: Yes    Alcohol/week: 10.0 standard drinks    Types: 10 Glasses of wine per week   Drug use: Not Currently    Types: Cocaine    Home Medications Prior to Admission medications   Medication Sig Start Date End Date Taking? Authorizing Provider  acetaminophen (TYLENOL) 325 MG tablet Take 650 mg by mouth every 6 (six) hours as needed for mild pain.   Yes [provider]  albuterol (VENTOLIN HFA) 108 (90 Base) MCG/ACT inhaler Inhale 2 puffs into the lungs every 4 (four) hours as needed for shortness of breath or wheezing.  11/06/19  Yes [provider]  amiodarone (PACERONE) 200 MG tablet Take 1 tablet (200 mg total) by mouth daily. 12/11/20  Yes Mercy Riding, MD  amLODipine (NORVASC) 2.5 MG tablet Take 2.5 mg by mouth daily. 01/20/21  Yes [provider]  calcitRIOL (ROCALTROL) 0.25 MCG capsule Take 9 capsules (2.25 mcg total) by mouth Every Tuesday,Thursday,and Saturday with dialysis. 12/12/20  Yes Mercy Riding, MD  carvedilol (COREG) 6.25 MG tablet Take 1 tablet (6.25 mg total) by mouth 2 (two) times daily with a meal. 12/11/20  Yes Mercy Riding, MD  cinacalcet (SENSIPAR) 30 MG tablet Take 6 tablets (180 mg total) by mouth Every Tuesday,Thursday,and Saturday with dialysis. 12/12/20  Yes Mercy Riding, MD  folic acid (FOLVITE) 1 MG tablet Take 1 tablet (1 mg total) by mouth daily. 12/12/20  Yes Mercy Riding, MD  Multiple Vitamin (MULTIVITAMIN WITH MINERALS) TABS tablet Take 1 tablet by mouth daily. 12/12/20  Yes Mercy Riding, MD  nicotine (NICODERM CQ - DOSED IN MG/24 HOURS) 14 mg/24hr patch Place 1 patch (14 mg total) onto the skin daily. 12/12/20  Yes Mercy Riding, MD  pantoprazole (PROTONIX) 40 MG tablet Take 40 mg by mouth daily.   Yes [provider]  prochlorperazine (COMPAZINE) 5 MG tablet Take 1 tablet (5 mg total) by mouth every 8 (eight) hours as needed for nausea or vomiting. 12/11/20  Yes Mercy Riding, MD  QUEtiapine (SEROQUEL) 100 MG tablet Take 100 mg by mouth at bedtime. 01/25/21  Yes [provider]  rosuvastatin (CRESTOR) 10 MG tablet Take 1 tablet (10 mg total) by mouth daily. 12/11/20 06/09/21 Yes Mercy Riding, MD  sevelamer carbonate (RENVELA) 800 MG tablet Take 4 tablets (3,200 mg total) by mouth 3 (three) times daily with meals. Patient taking differently: Take 4,000 mg by mouth 3 (three) times daily with meals. And take 4000 mg with snacks. 09/14/18  Yes Vasireddy, Grier Mitts, MD  thiamine 100 MG tablet Take 1 tablet (100 mg total) by mouth daily. 12/12/20  Yes Mercy Riding, MD   apixaban (ELIQUIS) 5 MG TABS tablet Take 1 tablet (5 mg total) by mouth 2 (two) times daily. 06/22/20 08/03/20  Karoline Caldwell, PA-C    Allergies    Patient has no known allergies.  Review of Systems   Review of Systems  Constitutional:  Positive for fatigue. Negative for chills and fever.  HENT:  Negative for ear pain and sore throat.   Eyes:  Negative for pain and visual disturbance.  Respiratory:  Negative for cough and shortness of breath.  Cardiovascular:  Negative for chest pain and palpitations.  Gastrointestinal:  Negative for abdominal pain, anal bleeding, blood in stool, constipation, diarrhea and vomiting.  Genitourinary:  Negative for dysuria and hematuria.  Musculoskeletal:  Negative for arthralgias and back pain.  Skin:  Negative for color change and rash.  Neurological:  Positive for light-headedness. Negative for seizures and syncope.  All other systems reviewed and are negative.  Physical Exam Updated Vital Signs BP 123/81 (BP Location: Right Arm)   Pulse 78   Temp 98.6 F (37 C) (Oral)   Resp 20   Ht 5\' 8"  (1.727 m)   Wt 73.6 kg   SpO2 97%   BMI 24.67 kg/m   Physical Exam Vitals and nursing note reviewed. Exam conducted with a chaperone present.  Constitutional:      Appearance: He is well-developed.  HENT:     Head: Normocephalic and atraumatic.  Eyes:     Conjunctiva/sclera: Conjunctivae normal.  Cardiovascular:     Rate and Rhythm: Normal rate and regular rhythm.     Heart sounds: No murmur heard. Pulmonary:     Effort: Pulmonary effort is normal. No respiratory distress.     Breath sounds: Normal breath sounds.  Abdominal:     Palpations: Abdomen is soft.     Tenderness: There is no abdominal tenderness.  Genitourinary:    Rectum: Guaiac result negative. No mass, tenderness, external hemorrhoid or internal hemorrhoid.  Musculoskeletal:     Cervical back: Neck supple.  Skin:    General: Skin is warm and dry.  Neurological:     Mental  Status: He is alert.    ED Results / Procedures / Treatments   Labs (all labs ordered are listed, but only abnormal results are displayed) Labs Reviewed  CBC WITH DIFFERENTIAL/PLATELET - Abnormal; Notable for the following components:      Result Value   WBC 2.7 (*)    RBC 2.59 (*)    Hemoglobin 8.6 (*)    HCT 28.3 (*)    MCV 109.3 (*)    RDW 18.4 (*)    Platelets 121 (*)    nRBC 1.1 (*)    Neutro Abs 1.5 (*)    Lymphs Abs 0.6 (*)    All other components within normal limits  BASIC METABOLIC PANEL - Abnormal; Notable for the following components:   Chloride 95 (*)    BUN 43 (*)    Creatinine, Ser 9.22 (*)    Calcium 8.6 (*)    GFR, Estimated 6 (*)    All other components within normal limits  CBC - Abnormal; Notable for the following components:   WBC 3.1 (*)    RBC 1.83 (*)    Hemoglobin 6.1 (*)    HCT 20.4 (*)    MCV 111.5 (*)    MCHC 29.9 (*)    RDW 18.6 (*)    Platelets 123 (*)    nRBC 1.0 (*)    All other components within normal limits  VITAMIN B12 - Abnormal; Notable for the following components:   Vitamin B-12 1,212 (*)    All other components within normal limits  IRON AND TIBC - Abnormal; Notable for the following components:   Saturation Ratios 52 (*)    All other components within normal limits  COMPREHENSIVE METABOLIC PANEL - Abnormal; Notable for the following components:   Chloride 96 (*)    BUN 51 (*)    Creatinine, Ser 9.92 (*)    Calcium 8.0 (*)  Total Protein 6.2 (*)    Albumin 2.9 (*)    AST 14 (*)    GFR, Estimated 5 (*)    All other components within normal limits  PHOSPHORUS - Abnormal; Notable for the following components:   Phosphorus 8.3 (*)    All other components within normal limits  CBC - Abnormal; Notable for the following components:   WBC 3.6 (*)    RBC 2.19 (*)    Hemoglobin 7.4 (*)    HCT 23.0 (*)    MCV 105.0 (*)    RDW 19.4 (*)    Platelets 116 (*)    nRBC 0.6 (*)    All other components within normal limits   CBC - Abnormal; Notable for the following components:   WBC 3.5 (*)    RBC 2.08 (*)    Hemoglobin 7.0 (*)    HCT 21.8 (*)    MCV 104.8 (*)    RDW 19.0 (*)    Platelets 110 (*)    nRBC 0.6 (*)    All other components within normal limits  TROPONIN I (HIGH SENSITIVITY) - Abnormal; Notable for the following components:   Troponin I (High Sensitivity) 55 (*)    All other components within normal limits  TROPONIN I (HIGH SENSITIVITY) - Abnormal; Notable for the following components:   Troponin I (High Sensitivity) 46 (*)    All other components within normal limits  RESP PANEL BY RT-PCR (FLU A&B, COVID) ARPGX2  MAGNESIUM  TSH  FOLATE  FERRITIN  MAGNESIUM  OCCULT BLOOD X 1 CARD TO LAB, STOOL  POC OCCULT BLOOD, ED  TYPE AND SCREEN  PREPARE RBC (CROSSMATCH)    EKG EKG Interpretation  Date/Time:  Monday January 25 2021 18:52:51 EDT Ventricular Rate:  76 PR Interval:  263 QRS Duration: 145 QT Interval:  449 QTC Calculation: 505 R Axis:   45 Text Interpretation: suspect atrial flutter Ventricular premature complex Prolonged PR interval Probable left ventricular hypertrophy nonspecific ST segment changes no acute STEMI Prolonged QT interval Confirmed by Madalyn Rob [phone]) on 01/26/2021 10:30:11 AM  Radiology DG Chest Portable 1 View  Result Date: 01/25/2021 CLINICAL DATA:  Weakness.  On dialysis. EXAM: PORTABLE CHEST 1 VIEW COMPARISON:  01/19/2021 FINDINGS: The cardiac silhouette remains moderately enlarged, unchanged. Aortic atherosclerosis is noted. No airspace consolidation, edema, pleural effusion, pneumothorax is identified. No acute osseous abnormality is seen. IMPRESSION: No active disease. Electronically Signed   By: Logan Bores M.D.   On: 01/25/2021 12:54    Procedures Procedures   Medications Ordered in ED Medications  acetaminophen (TYLENOL) tablet 650 mg (has no administration in time range)    Or  acetaminophen (TYLENOL) suppository 650 mg (has no  administration in time range)  polyethylene glycol (MIRALAX / GLYCOLAX) packet 17 g (has no administration in time range)  LORazepam (ATIVAN) tablet 1-4 mg (2 mg Oral Given 01/25/21 2108)    Or  LORazepam (ATIVAN) injection 1-4 mg ( Intravenous See Alternative 01/25/21 2108)  thiamine tablet 100 mg (100 mg Oral Given 01/25/21 1812)    Or  thiamine (B-1) injection 100 mg ( Intravenous See Alternative 1/91/47 8295)  folic acid (FOLVITE) tablet 1 mg (1 mg Oral Given 01/25/21 1812)  multivitamin with minerals tablet 1 tablet (1 tablet Oral Given 01/25/21 1812)  amiodarone (PACERONE) tablet 200 mg (has no administration in time range)  albuterol (PROVENTIL) (2.5 MG/3ML) 0.083% nebulizer solution 3 mL (has no administration in time range)  carvedilol (COREG) tablet 6.25 mg (6.25  mg Oral Given 01/25/21 2102)  amLODipine (NORVASC) tablet 2.5 mg (has no administration in time range)  rosuvastatin (CRESTOR) tablet 10 mg (has no administration in time range)  sevelamer carbonate (RENVELA) tablet 3,200 mg (3,200 mg Oral Given 01/26/21 0816)  sevelamer carbonate (RENVELA) tablet 4,000 mg (has no administration in time range)  Chlorhexidine Gluconate Cloth 2 % PADS 6 each (has no administration in time range)  pentafluoroprop-tetrafluoroeth (GEBAUERS) aerosol 1 application (has no administration in time range)  lidocaine (PF) (XYLOCAINE) 1 % injection 5 mL (has no administration in time range)  lidocaine-prilocaine (EMLA) cream 1 application (has no administration in time range)  0.9 %  sodium chloride infusion (has no administration in time range)  0.9 %  sodium chloride infusion (has no administration in time range)  pantoprazole (PROTONIX) EC tablet 40 mg (has no administration in time range)  0.9 %  sodium chloride infusion (Manually program via Guardrails IV Fluids) ( Intravenous Stopped 01/25/21 2104)    ED Course  I have reviewed the triage vital signs and the nursing notes.  Pertinent labs & imaging  results that were available during my care of the patient were reviewed by me and considered in my medical decision making (see chart for details).    MDM Rules/Calculators/A&P                          65 year old male with a history of ESRD on HD presenting due to concern for acute anemia with a hemoglobin of 5.7 at his PCP office.  On arrival, the patient complained of continued symptomatic anemia with lightheadedness, fatigue, dyspnea.  Concern for symptomatic anemia.  The patient has a history of GI bleeds and endorses findings concerning for possible melena as recently as 1 week ago but no recent episodes of hematochezia or melena.  He is not on anticoagulation.  Rectal exam performed without evidence of gross melena, fecal occult negative.  Given the patient's results at his PCP, the patient was consented for blood products and a type and screen was ordered.  A CBC was collected and ultimately resulted significant for improvement in the patient's hemoglobin to 8.6.  Given this finding, plan to discuss with nephrology and reassess regarding plan for safe disposition given the patient's symptomatic anemia with an ultimate plan for likely admission.   Care of the patient was transferred to [facility] Goad at 1500.  Final Clinical Impression(s) / ED Diagnoses Final diagnoses:  Symptomatic anemia  ESRD on hemodialysis [facility])    Rx / DC Orders ED Discharge Orders     None        Regan Lemming, MD 01/26/21 1335

## 2021-01-25 NOTE — Progress Notes (Signed)
Called regarding EDP, requesting admission for lightheadedness, concern for symptomatic anemia with Hb 5.8 at dialysis.  Repeat here is 8.6.  Requested orthostatics, vitals, repeat labs.  Can consider admission if symptomatic for observation, will follow with EDP.

## 2021-01-25 NOTE — ED Triage Notes (Signed)
Reports hemoglobin is 5.8. Dialysis pt. Also reports progressive weakness. Alert and oriented x 4. Bp of 90/60. Hx of blood transfusions in the past.

## 2021-01-26 ENCOUNTER — Other Ambulatory Visit (HOSPITAL_COMMUNITY): Payer: 59

## 2021-01-26 ENCOUNTER — Other Ambulatory Visit: Payer: Self-pay

## 2021-01-26 ENCOUNTER — Encounter (HOSPITAL_COMMUNITY): Payer: Self-pay | Admitting: Family Medicine

## 2021-01-26 DIAGNOSIS — Z992 Dependence on renal dialysis: Secondary | ICD-10-CM

## 2021-01-26 DIAGNOSIS — N186 End stage renal disease: Secondary | ICD-10-CM

## 2021-01-26 DIAGNOSIS — K921 Melena: Secondary | ICD-10-CM

## 2021-01-26 DIAGNOSIS — D649 Anemia, unspecified: Secondary | ICD-10-CM

## 2021-01-26 LAB — CBC
HCT: 23 % — ABNORMAL LOW (ref 39.0–52.0)
HCT: 21.8 % — ABNORMAL LOW (ref 39.0–52.0)
Hemoglobin: 7.4 g/dL — ABNORMAL LOW (ref 13.0–17.0)
Hemoglobin: 7 g/dL — ABNORMAL LOW (ref 13.0–17.0)
MCH: 33.8 pg (ref 26.0–34.0)
MCH: 33.7 pg (ref 26.0–34.0)
MCHC: 32.2 g/dL (ref 30.0–36.0)
MCHC: 32.1 g/dL (ref 30.0–36.0)
MCV: 105 fL — ABNORMAL HIGH (ref 80.0–100.0)
MCV: 104.8 fL — ABNORMAL HIGH (ref 80.0–100.0)
Platelets: 116 10*3/uL — ABNORMAL LOW (ref 150–400)
Platelets: 110 10*3/uL — ABNORMAL LOW (ref 150–400)
RBC: 2.19 MIL/uL — ABNORMAL LOW (ref 4.22–5.81)
RBC: 2.08 MIL/uL — ABNORMAL LOW (ref 4.22–5.81)
RDW: 19.4 % — ABNORMAL HIGH (ref 11.5–15.5)
RDW: 19 % — ABNORMAL HIGH (ref 11.5–15.5)
WBC: 3.6 10*3/uL — ABNORMAL LOW (ref 4.0–10.5)
WBC: 3.5 10*3/uL — ABNORMAL LOW (ref 4.0–10.5)
nRBC: 0.6 % — ABNORMAL HIGH (ref 0.0–0.2)
nRBC: 0.6 % — ABNORMAL HIGH (ref 0.0–0.2)

## 2021-01-26 LAB — COMPREHENSIVE METABOLIC PANEL
ALT: 11 U/L (ref 0–44)
AST: 14 U/L — ABNORMAL LOW (ref 15–41)
Albumin: 2.9 g/dL — ABNORMAL LOW (ref 3.5–5.0)
Alkaline Phosphatase: 52 U/L (ref 38–126)
Anion gap: 12 (ref 5–15)
BUN: 51 mg/dL — ABNORMAL HIGH (ref 8–23)
CO2: 28 mmol/L (ref 22–32)
Calcium: 8 mg/dL — ABNORMAL LOW (ref 8.9–10.3)
Chloride: 96 mmol/L — ABNORMAL LOW (ref 98–111)
Creatinine, Ser: 9.92 mg/dL — ABNORMAL HIGH (ref 0.61–1.24)
GFR, Estimated: 5 mL/min — ABNORMAL LOW (ref >60)
Glucose, Bld: 89 mg/dL (ref 70–99)
Potassium: 4.2 mmol/L (ref 3.5–5.1)
Sodium: 136 mmol/L (ref 135–145)
Total Bilirubin: 0.8 mg/dL (ref 0.3–1.2)
Total Protein: 6.2 g/dL — ABNORMAL LOW (ref 6.5–8.1)

## 2021-01-26 LAB — PHOSPHORUS: Phosphorus: 8.3 mg/dL — ABNORMAL HIGH (ref 2.5–4.6)

## 2021-01-26 LAB — FOLATE: Folate: 96 ng/mL (ref >5.9)

## 2021-01-26 LAB — IRON AND TIBC
Iron: 178 ug/dL (ref 45–182)
Saturation Ratios: 52 % — ABNORMAL HIGH (ref 17.9–39.5)
TIBC: 344 ug/dL (ref 250–450)
UIBC: 166 ug/dL

## 2021-01-26 LAB — FERRITIN: Ferritin: 160 ng/mL (ref 24–336)

## 2021-01-26 LAB — TROPONIN I (HIGH SENSITIVITY): Troponin I (High Sensitivity): 46 ng/L — ABNORMAL HIGH (ref <18)

## 2021-01-26 LAB — MAGNESIUM: Magnesium: 2 mg/dL (ref 1.7–2.4)

## 2021-01-26 MED ORDER — SODIUM CHLORIDE 0.9 % IV SOLN: 100.0000 mL | INTRAVENOUS | Status: DC | PRN

## 2021-01-26 MED ORDER — PENTAFLUOROPROP-TETRAFLUOROETH EX AERO: 1.0000 "application " | INHALATION_SPRAY | CUTANEOUS | Status: DC | PRN

## 2021-01-26 MED ORDER — LIDOCAINE-PRILOCAINE 2.5-2.5 % EX CREA: 1.0000 "application " | TOPICAL_CREAM | CUTANEOUS | Status: DC | PRN

## 2021-01-26 MED ORDER — PANTOPRAZOLE SODIUM 40 MG PO TBEC
40.0000 mg | DELAYED_RELEASE_TABLET | Freq: Every day | ORAL | Status: DC
  Administered 2021-01-26 – 2021-01-29 (×3): 40 mg via ORAL
  Filled 2021-01-26 (×4): qty 1

## 2021-01-26 MED ORDER — LIDOCAINE HCL (PF) 1 % IJ SOLN: 5.0000 mL | INTRAMUSCULAR | Status: DC | PRN

## 2021-01-26 MED ORDER — CHLORHEXIDINE GLUCONATE CLOTH 2 % EX PADS
6.0000 | MEDICATED_PAD | Freq: Every day | CUTANEOUS | Status: DC
  Administered 2021-01-26 – 2021-01-28 (×2): 6 via TOPICAL

## 2021-01-26 NOTE — H&P (View-Only) (Signed)
[facility] Consult: 9:12 AM 01/26/2021  LOS: 1 day    Referring Provider: Dr Avon Gully  Primary Care Physician:  [name], FNP Primary Gastroenterologist:  Dr. Havery Moros.       Reason for Consultation:  recurrent acute on chronic anemia.     HPI: Raymond [name] is a 65 y.o. male.  Hx PAF, on Eliquis.  Eradicated hep C.  ESRD on HD MWF.  CAD.  Diastolic heart failure.  PVD, s/p L fem-pop 2019. S/p 06/2020 stenting of right common and external iliac arteries, later that I required evacuation of left groin and scrotal hematoma and repair left common femoral artery cannulation.  COPD.  Thrombocytopenia.  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  Previous transfusions for anemia from blood loss and CKD. 1 PRBC in 08/2018, 3 PRBCs in 01/28/2020, 2 PRBCs in 06/29/2020,  1 PRBC 07/2020.    08/2018 EGD.  For nonbleeding duodenal AVMs treated with APC. 08/2018 colonoscopy with adequate prep revealed diverticulosis, nonbleeding hemorrhoids, several small adenomatous polyps removed. 01/2020 SBE.  Solitary nonbleeding gastric AVM treated with APC.  Biopsy of nodular mucosa in D1 (path: peptic duodenitis).  6, nonbleeding AVMs in the distal duodenum, proximal and mid jejunum were all treated with APC.  MD recommended VCE if recurrent bleeding. 01/2020 CTAP w angio: No active GI bleed.  Thickened GB wall with possible small pericholecystic fluid, findings may be related to underlying liver disease though cholecystitis not excluded.  Scattered sigmoid diverticulosis.  No liver disease or biliary duct abnormalities. 07/31/2020 SBE.  Normal esophagus, stomach, duodenum, examined jejunum.  No pathology or sources for blood loss.  Unable to endoscopically placed VCE at the time of the procedure due to patient coughing. 07/31/20 VCE: Pt swallowed  capsule.  Study limited as capsule retained in esophagus and then stomach for 10 hours and the patient ate within 4 hours of the study so there was retained food in the stomach and small bowel which limited visualization.  2, small, nonbleeding AVMs seen in distal small bowel, "clearly beyond the reach of the enteroscopy".  The entire small bowel was not visualized before end of battery life.  Eliquis discontinued in 07/2019.  Plavix discontinued 2 or 3 weeks ago.  Not on aspirin. Pantoprazole discontinued as of 12/2020.     Admission 7/12 -01/20/2021 w AMS, serum glucose 35, nonacute brain MRI.  Hgb then was 7.3 (10-11 in early June 2022) , MCV 107.  For several days feeling weak, fatigued, experiencing dyspnea on exertion.  Heaviness in his legs.  On Saturday, at dialysis advised to go to the hospital for evaluation of low blood counts but he did not feel poorly enough to go and followed up w PCP office on Monday.  By that time his symptoms had progressed but was not having angina.  Repeat labs again consistent with anemia and patient sent to the ED via EMS.  About a week ago, for 3 or 4 days, patient had tarry black stools 3 or 4 times a day.  This resolved and he resumed his  brown stools once or twice daily.  In recent weeks has been having more frequent postprandial nausea and vomiting of partially digested foods but no blood or coffee-ground material seen.  No abdominal pain.  No sense of reflux.  No dysphagia.  Weight stable. At HD receives Mircera q 2 weeks.    Hgb 8.6 >> 6.1 >> 1 PRBC >> 7.4.  MCV 111.  Platelets 116 (83 one week ago).  Chemistries consistent with ESRD.  LFTs normal.  Iron, TIBC, iron sats, ferritin, folate, B12 at or above normal. CXR unremarkable.  Pt denies family history of colorectal cancer, ulcer disease, GI bleeds. A couple of times a week drinks a pint of wine.  Smokes 6 cigarettes daily.  Occasionally smokes cocaine, last use was 2 to 3 weeks ago.    Past Medical  History:  Diagnosis Date   Anemia    CAD (coronary artery disease)    a. 03/2018: cath showing 31 to 30% stenosis along the LAD, luminal irregularities along the RCA, and angiographically normal LCx   COPD (chronic obstructive pulmonary disease) (HCC)    ESRD (end stage renal disease) on dialysis [facility])    "MWF; Jene[facility] Rinks" (07/22/17)   Hepatitis C    Still positive s/p liver biopsy at [facility]  and interferon therapy for 6 months. Most recent lab work was on 10/24/12   Hepatitis C    "took the tx; gone now" (12/05/2016)  Was treated   History of blood transfusion ~ 2012/2013   "related to my kidneys; blood was low"   Hypertension    Peripheral arterial disease ([place])    Substance abuse ([place])    Thyroid disease     Past Surgical History:  Procedure Laterality Date   ABDOMINAL AORTOGRAM W/LOWER EXTREMITY N/A 02/08/2018   Procedure: ABDOMINAL AORTOGRAM W/LOWER EXTREMITY;  Surgeon: Conrad Burlison, MD;  Location: [place] CV LAB;  Service: Cardiovascular;  Laterality: N/A;   ABDOMINAL AORTOGRAM W/LOWER EXTREMITY Bilateral 06/15/2020   Procedure: ABDOMINAL AORTOGRAM W/LOWER EXTREMITY;  Surgeon: Waynetta Sandy, MD;  Location: [place] CV LAB;  Service: Cardiovascular;  Laterality: Bilateral;   AV FISTULA PLACEMENT Left Aug. 2013 ?   BIOPSY  01/17/2020   Procedure: BIOPSY;  Surgeon: Jackquline Denmark, MD;  Location: [place];  Service: Endoscopy;;   COLONOSCOPY WITH PROPOFOL N/A 08/21/2018   Procedure: COLONOSCOPY WITH PROPOFOL;  Surgeon: Yetta Flock, MD;  Location: [place];  Service: Gastroenterology;  Laterality: N/A;   ENTEROSCOPY N/A 01/17/2020   Procedure: ENTEROSCOPY;  Surgeon: Jackquline Denmark, MD;  Location: [facility] ENDOSCOPY;  Service: Endoscopy;  Laterality: N/A;   ENTEROSCOPY N/A 07/31/2020   Procedure: ENTEROSCOPY;  Surgeon: Doran Stabler, MD;  Location: [place];  Service: Gastroenterology;  Laterality: N/A;   ESOPHAGOGASTRODUODENOSCOPY (EGD) WITH PROPOFOL N/A  08/20/2018   Procedure: ESOPHAGOGASTRODUODENOSCOPY (EGD) WITH PROPOFOL;  Surgeon: Gatha Mayer, MD;  Location: [place];  Service: Endoscopy;  Laterality: N/A;   FEMORAL-POPLITEAL BYPASS GRAFT Left 04/11/2018   Procedure: BYPASS GRAFT FEMORAL-POPLITEAL ARTERY LEFT LEG;  Surgeon: Rosetta Posner, MD;  Location: [facility] OR;  Service: Vascular;  Laterality: Left;   GIVENS CAPSULE STUDY N/A 07/31/2020   Procedure: GIVENS CAPSULE STUDY;  Surgeon: Doran Stabler, MD;  Location: [place];  Service: Gastroenterology;  Laterality: N/A;   HOT HEMOSTASIS N/A 08/20/2018   Procedure: HOT HEMOSTASIS (ARGON PLASMA COAGULATION/BICAP);  Surgeon: Gatha Mayer, MD;  Location: [facility] ENDOSCOPY;  Service: Endoscopy;  Laterality: N/A;   HOT  HEMOSTASIS N/A 01/17/2020   Procedure: HOT HEMOSTASIS (ARGON PLASMA COAGULATION/BICAP);  Surgeon: Jackquline Denmark, MD;  Location: [facility] ENDOSCOPY;  Service: Endoscopy;  Laterality: N/A;   LEFT HEART CATH AND CORONARY ANGIOGRAPHY N/A 03/29/2018   Procedure: LEFT HEART CATH AND CORONARY ANGIOGRAPHY;  Surgeon: Nelva Bush, MD;  Location: [place] CV LAB;  Service: Cardiovascular;  Laterality: N/A;   LIVER BIOPSY     ORIF ULNAR FRACTURE Left 05/18/2017   Procedure: OPEN REDUCTION INTERNAL FIXATION (ORIF) ULNAR FRACTURE;  Surgeon: Iran Planas, MD;  Location: [place];  Service: Orthopedics;  Laterality: Left;   PERIPHERAL VASCULAR INTERVENTION Right 06/15/2020   Procedure: PERIPHERAL VASCULAR INTERVENTION;  Surgeon: Waynetta Sandy, MD;  Location: [place] CV LAB;  Service: Cardiovascular;  Laterality: Right;   POLYPECTOMY  08/21/2018   Procedure: POLYPECTOMY;  Surgeon: Yetta Flock, MD;  Location: [place];  Service: Gastroenterology;;   Earney Mallet N/A 12/11/2012   Procedure: Earney Mallet;  Surgeon: Serafina Mitchell, MD;  Location: [facility] CATH LAB;  Service: Cardiovascular;  Laterality: N/A;   WOUND EXPLORATION Left 06/15/2020   Procedure: GROIN  EXPLORATION;  Surgeon:  Waynetta Sandy, MD;  Location: [place];  Service: Vascular;  Laterality: Left;    Prior to Admission medications   Medication Sig Start Date End Date Taking? Authorizing Provider  acetaminophen (TYLENOL) 325 MG tablet Take 650 mg by mouth every 6 (six) hours as needed for mild pain.   Yes [provider]  albuterol (VENTOLIN HFA) 108 (90 Base) MCG/ACT inhaler Inhale 2 puffs into the lungs every 4 (four) hours as needed for shortness of breath or wheezing. 11/06/19  Yes [provider]  amiodarone (PACERONE) 200 MG tablet Take 1 tablet (200 mg total) by mouth daily. 12/11/20  Yes Mercy Riding, MD  amLODipine (NORVASC) 2.5 MG tablet Take 2.5 mg by mouth daily. 01/20/21  Yes [provider]  calcitRIOL (ROCALTROL) 0.25 MCG capsule Take 9 capsules (2.25 mcg total) by mouth Every Tuesday,Thursday,and Saturday with dialysis. 12/12/20  Yes Mercy Riding, MD  carvedilol (COREG) 6.25 MG tablet Take 1 tablet (6.25 mg total) by mouth 2 (two) times daily with a meal. 12/11/20  Yes Mercy Riding, MD  cinacalcet (SENSIPAR) 30 MG tablet Take 6 tablets (180 mg total) by mouth Every Tuesday,Thursday,and Saturday with dialysis. 12/12/20  Yes Mercy Riding, MD  folic acid (FOLVITE) 1 MG tablet Take 1 tablet (1 mg total) by mouth daily. 12/12/20  Yes Mercy Riding, MD  Multiple Vitamin (MULTIVITAMIN WITH MINERALS) TABS tablet Take 1 tablet by mouth daily. 12/12/20  Yes Mercy Riding, MD  nicotine (NICODERM CQ - DOSED IN MG/24 HOURS) 14 mg/24hr patch Place 1 patch (14 mg total) onto the skin daily. 12/12/20  Yes Mercy Riding, MD  pantoprazole (PROTONIX) 40 MG tablet Take 40 mg by mouth daily.   Yes [provider]  prochlorperazine (COMPAZINE) 5 MG tablet Take 1 tablet (5 mg total) by mouth every 8 (eight) hours as needed for nausea or vomiting. 12/11/20  Yes Mercy Riding, MD  QUEtiapine (SEROQUEL) 100 MG tablet Take 100 mg by mouth at bedtime. 01/25/21  Yes [provider]   rosuvastatin (CRESTOR) 10 MG tablet Take 1 tablet (10 mg total) by mouth daily. 12/11/20 06/09/21 Yes Mercy Riding, MD  sevelamer carbonate (RENVELA) 800 MG tablet Take 4 tablets (3,200 mg total) by mouth 3 (three) times daily with meals. Patient taking differently: Take 4,000 mg by mouth 3 (three)  times daily with meals. And take 4000 mg with snacks. 09/14/18  Yes Vasireddy, Grier Mitts, MD  thiamine 100 MG tablet Take 1 tablet (100 mg total) by mouth daily. 12/12/20  Yes Mercy Riding, MD  apixaban (ELIQUIS) 5 MG TABS tablet Take 1 tablet (5 mg total) by mouth 2 (two) times daily. 06/22/20 08/03/20  Karoline Caldwell, PA-C    Scheduled Meds:  amiodarone  200 mg Oral Daily   amLODipine  2.5 mg Oral Daily   carvedilol  6.25 mg Oral BID WC   Chlorhexidine Gluconate Cloth  6 each Topical [id number]   folic acid  1 mg Oral Daily   multivitamin with minerals  1 tablet Oral Daily   pantoprazole (PROTONIX) IV  40 mg Intravenous Q12H   rosuvastatin  10 mg Oral Daily   sevelamer carbonate  3,200 mg Oral TID WC   thiamine  100 mg Oral Daily   Or   thiamine  100 mg Intravenous Daily   Infusions:  PRN Meds: acetaminophen **OR** acetaminophen, albuterol, LORazepam **OR** LORazepam, polyethylene glycol, sevelamer carbonate   Allergies as of 01/25/2021   (No Known Allergies)    Family History  Problem Relation Age of Onset   Diabetes Father    Hypertension Father    Heart disease Father     Social History   Socioeconomic History   Marital status: Single    Spouse name: Not on file   Number of children: 2   Years of education: Not on file   Highest education level: 11th grade  Occupational History   Not on file  Tobacco Use   Smoking status: Every Day    Packs/day: 0.20    Years: 25.00    Pack years: 5.00    Types: Cigarettes    Last attempt to quit: 08/01/2011    Years since quitting: 9.4   Smokeless tobacco: Never   Tobacco comments:    he smokes 6 cigarettes a week  Vaping Use    Vaping Use: Never used  Substance and Sexual Activity   Alcohol use: Yes    Alcohol/week: 10.0 standard drinks    Types: 10 Glasses of wine per week   Drug use: Not Currently    Types: Cocaine   Sexual activity: Not on file  Other Topics Concern   Not on file  Social History Narrative   Patient is living in a apartment alone. Professional cement finisher. Has 2 children (58yo & 89yo sons). Plans to make HCPOA for sister Neoma Laming to make decisions for him should he need them.    Social Determinants of Health   Financial Resource Strain: Not on file  Food Insecurity: Not on file  Transportation Needs: Not on file  Physical Activity: Not on file  Stress: Not on file  Social Connections: Not on file  Intimate Partner Violence: Not on file    REVIEW OF SYSTEMS: Constitutional: Weakness, fatigue. ENT:  No nose bleeds Pulm: Dyspnea on exertion.  No active cough CV:  No palpitations, no LE edema.  No angina PO GU:  No hematuria, no frequency GI: See HPI. Heme: Denies unusual or excessive bleeding or bruising. Transfusions: See HPI for details. Neuro:  No headaches, no peripheral tingling or numbness.  No syncope, no seizures. + dizziness. Derm:  No itching, no rash or sores.  Endocrine:  No sweats or chills.  No polyuria or dysuria Immunization: Vaccination history reviewed.  Vaccinated with Moderna COVID-19 vaccine in February and March 2021, received [place] booster  in October 2021 Travel:  None beyond local counties in last few months.    PHYSICAL EXAM: Vital signs in last 24 hours: Vitals:   01/26/21 0019 01/26/21 0520  BP: 106/69 (!) 134/96  Pulse: 73 75  Resp: 18 17  Temp: 98 F (36.7 C) 97.8 F (36.6 C)  SpO2: 100% 93%   Wt Readings from Last 3 Encounters:  01/25/21 71.6 kg  12/11/20 71.6 kg  07/31/20 72.6 kg    General: Pleasant, not acutely ill-appearing, comfortable.  Alert. Head: No facial asymmetry or swelling.  No signs of head trauma. Eyes: No  scleral icterus.  No conjunctival pallor.  EOMI Ears: Not hard of hearing Nose: No congestion or discharge. Mouth: Tongue midline.  Mucosa pink, moist, clear.  Missing teeth, fair condition of remaining teeth. Neck: No JVD, no masses, no thyromegaly Lungs: Diminished breath sounds globally but clear without adventitious sounds.  No cough.  No dyspnea Heart: RRR.  Rate in the 80s.  No MRG.  S1, S2 present. Abdomen: Soft.  Not tender.  No distention.  No masses, HSM, bruits, hernias.  Bowel sounds active.   Rectal: Deferred.  Stool submitted to lab yesterday afternoon was FOBT negative Musc/Skeltl: No joint redness, swelling or gross deformity. Extremities: No CCE.  Palpable pulse in fistula on left upper extremity. Neurologic: Oriented x3.  Moves all 4 limbs without tremor.  Strength not tested. Skin: No rash, no sores, no suspicious lesions. Nodes: No cervical or inguinal adenopathy Psych: Operative, calm, pleasant.  Normal affect  Intake/Output from previous day: 07/18 0701 - 07/19 0700 In: 1020 [P.O.:240; I.V.:150; Blood:630] Out: 0  Intake/Output this shift: No intake/output data recorded.  LAB RESULTS: Recent Labs    01/25/21 1146 01/25/21 1600 01/26/21 0150  WBC 2.7* 3.1* 3.6*  HGB 8.6* 6.1* 7.4*  HCT 28.3* 20.4* 23.0*  PLT 121* 123* 116*   BMET Lab Results  Component Value Date   NA 136 01/26/2021   NA 141 01/25/2021   NA 135 01/20/2021   K 4.2 01/26/2021   K 3.8 01/25/2021   K 3.7 01/20/2021   CL 96 (L) 01/26/2021   CL 95 (L) 01/25/2021   CL 93 (L) 01/20/2021   CO2 28 01/26/2021   CO2 31 01/25/2021   CO2 33 (H) 01/20/2021   GLUCOSE 89 01/26/2021   GLUCOSE 76 01/25/2021   GLUCOSE 190 (H) 01/20/2021   BUN 51 (H) 01/26/2021   BUN 43 (H) 01/25/2021   BUN 54 (H) 01/20/2021   CREATININE 9.92 (H) 01/26/2021   CREATININE 9.22 (H) 01/25/2021   CREATININE 8.04 (H) 01/20/2021   CALCIUM 8.0 (L) 01/26/2021   CALCIUM 8.6 (L) 01/25/2021   CALCIUM 8.0 (L) 01/20/2021    LFT Recent Labs    01/26/21 0150  PROT 6.2*  ALBUMIN 2.9*  AST 14*  ALT 11  ALKPHOS 52  BILITOT 0.8   PT/INR Lab Results  Component Value Date   INR 1.5 (H) 07/29/2020   INR 1.5 (H) 07/28/2020   INR 1.23 08/19/2018   Hepatitis Panel No results for input(s): HEPBSAG, HCVAB, HEPAIGM, HEPBIGM in the last 72 hours. C-Diff No components found for: CDIFF Lipase     Component Value Date/Time   LIPASE 54 (H) 12/24/2018 1318    Drugs of Abuse     Component Value Date/Time   LABOPIA NONE DETECTED 05/07/2010 1547   COCAINSCRNUR NONE DETECTED 05/07/2010 1547   LABBENZ NONE DETECTED 05/07/2010 1547   AMPHETMU NONE DETECTED 05/07/2010 1547  THCU NONE DETECTED 05/07/2010 1547   LABBARB  05/07/2010 1547    NONE DETECTED        DRUG SCREEN FOR MEDICAL PURPOSES ONLY.  IF CONFIRMATION IS NEEDED FOR ANY PURPOSE, NOTIFY LAB WITHIN 5 DAYS.        LOWEST DETECTABLE LIMITS FOR URINE DRUG SCREEN Drug Class       Cutoff (ng/mL) Amphetamine      1000 Barbiturate      200 Benzodiazepine   944 Tricyclics       967 Opiates          300 Cocaine          300 THC              50     RADIOLOGY STUDIES: DG Chest Portable 1 View  Result Date: 01/25/2021 CLINICAL DATA:  Weakness.  On dialysis. EXAM: PORTABLE CHEST 1 VIEW COMPARISON:  01/19/2021 FINDINGS: The cardiac silhouette remains moderately enlarged, unchanged. Aortic atherosclerosis is noted. No airspace consolidation, edema, pleural effusion, pneumothorax is identified. No acute osseous abnormality is seen. IMPRESSION: No active disease. Electronically Signed   By: Logan Bores M.D.   On: 01/25/2021 12:54      IMPRESSION:   Recurrent acute on chronic anemia.  Tarry stools for a few days last week, now resolved.  Previous GI bleeding attributed to small bowel AVMs.  At latest 07/31/2020 SBE into the jejunum, no AVMs encountered.  However nonbleeding AVMs in distal small bowel noted on subsequent, though suboptimal, 07/31/20 VCE.   These AVMs were well beyond the reach of enteroscope.  S/p 1 PRBC.  Paroxysmal A. fib, peripheral vascular disease.  Previous Eliquis discontinued in January 2022.  Plavix discontinued 2 to 3 weeks ago.      Hx Hep C, eradicated per notes.  However the only HCV quant I see is from 04/2010 and measured 327,000.  Do not see fup labs.  LFTs WNL.  So unclear if this was eradicated.  Based on lifestyle, worth rechecking.   Hep B serolgies negative on multiple occasions, most recently 12/09/20.      Pancytopenia.  Chronic.      Substance abuse.  Tobacco, cocaine, ETOH.      PLAN:       SBE, tomorrow time TBD.  Ok to eat today but NPO after midnight.      Hep C quant in AM.  Switch to po Protonix      Azucena Freed  01/26/2021, 9:12 AM Phone [phone]

## 2021-01-26 NOTE — Procedures (Signed)
I was present at this dialysis session. I have reviewed the session itself and made appropriate changes.   Vital signs in last 24 hours:  Temp:  [97.8 F (36.6 C)-98.6 F (37 C)] 98.6 F (37 C) (07/19 1212) Pulse Rate:  [58-107] 76 (07/19 1400) Resp:  [14-22] 20 (07/19 1230) BP: (93-144)/(58-96) (P) 85/58 (07/19 1415) SpO2:  [93 %-100 %] 97 % (07/19 1212) Weight:  [73.6 kg] 73.6 kg (07/19 1212) Weight change:  Filed Weights   01/25/21 1131 01/26/21 1212  Weight: 71.6 kg 73.6 kg    Recent Labs  Lab 01/26/21 0150  NA 136  K 4.2  CL 96*  CO2 28  GLUCOSE 89  BUN 51*  CREATININE 9.92*  CALCIUM 8.0*  PHOS 8.3*    Recent Labs  Lab 01/19/21 1729 01/20/21 0327 01/25/21 1146 01/25/21 1600 01/26/21 0150 01/26/21 1042  WBC 1.8*   < > 2.7* 3.1* 3.6* 3.5*  NEUTROABS 1.0*  --  1.5*  --   --   --   HGB 7.3*   < > 8.6* 6.1* 7.4* 7.0*  HCT 24.0*   < > 28.3* 20.4* 23.0* 21.8*  MCV 107.6*   < > 109.3* 111.5* 105.0* 104.8*  PLT 83*   < > 121* 123* 116* 110*   < > = values in this interval not displayed.    Scheduled Meds:  amiodarone  200 mg Oral Daily   amLODipine  2.5 mg Oral Daily   carvedilol  6.25 mg Oral BID WC   Chlorhexidine Gluconate Cloth  6 each Topical G6269   folic acid  1 mg Oral Daily   multivitamin with minerals  1 tablet Oral Daily   pantoprazole  40 mg Oral Q0600   rosuvastatin  10 mg Oral Daily   sevelamer carbonate  3,200 mg Oral TID WC   thiamine  100 mg Oral Daily   Or   thiamine  100 mg Intravenous Daily   Continuous Infusions:  sodium chloride     sodium chloride     PRN Meds:.sodium chloride, sodium chloride, acetaminophen **OR** acetaminophen, albuterol, lidocaine (PF), lidocaine-prilocaine, LORazepam **OR** LORazepam, pentafluoroprop-tetrafluoroeth, polyethylene glycol, sevelamer carbonate   Donetta Potts,  MD 01/26/2021, 2:38 PM

## 2021-01-26 NOTE — Progress Notes (Signed)
PROGRESS NOTE    Raymond [name]  QIO:[id number] DOB: March 17, 1956 DOA: 01/25/2021 PCP: [name], FNP   Brief Narrative:  Raymond [name] is a 65 y.o. male with medical history significant of ESRD, distal small bowel AVM's, CAD, COPD, HTN and multiple other medical problems presenting with dyspnea on exertion and generalized weakness.   He was recently discharged on 7/13 after being hospitalized with confusion that was thought to be related to low blood sugars.  He notes when he got home he realized he left his phone and got a ride back.  When he was walking down the hall, he was weak and tired and had to stop several times with shortness of breath.  He also notes that he felt poorly after dialysis Thursday - weak, couldn't walk far - felt like he'd been running, legs heavy and weak.  Felt better after eating candy.  Saturday he notes he was told his blood counts were low and advised to come to the hospital.  He didn't come in, but today, went to PCP office and got blood draw and was found to be anemic and they directed him to the ED via EMS.  Notes smoking, etoh, cocaine use.    Assessment & Plan:   Active Problems:   Anemia   Symptomatic anemia  Symptomatic Anemia  Suspected Acute Blood Loss Anemia 2/2 GI Bleed  Shortness of Breath and Chest Pain with Exertion  Generalized Weakness Repeat hemoglobin 7.0 Hemoccult negative, melena reported 1 week prior to admission but resolved  Capsule endoscopy 1/21 with distal small bowel AVM's - he's not taking aspirin or plavix - not taking any anticoagulation per report PPI BID 1 unit transfused since admission GI consulted, appreciate insight and recommendations   Paroxysmal Atrial Fibrillation Currently in flutter Continue home amiodarone, coreg Not on anticoagulation given above   Pancytopenia Follow iron, b12, folate, ferritin.  Follow smear.   Diastolic HF Symptoms all likely related to anemia, follow repeat echo   Tobacco Use  Disorder Encouraged cessation  Cocaine Use Disorder Encouraged cessation   Alcohol Use CIWA ongoing - has not scored   ESRD HD per renal   DVT prophylaxis:  SCD, holding anticoagulation given above Code Status: full  Family Communication: None present  Status is: Inpatient  Dispo: The patient is from: Home              Anticipated d/c is to: Home              Anticipated d/c date is: 48 to 72 hours              Patient currently not medically stable for discharge  Consultants:  GI  Procedures:  To be determined  Antimicrobials:  None indicated  Subjective: No acute issues or events overnight tolerated transfusion quite well denies nausea vomiting diarrhea constipation headache fevers chills or chest pain.  Objective: Vitals:   01/25/21 2100 01/25/21 2106 01/26/21 0019 01/26/21 0520  BP: (!) 144/93 (!) 144/93 106/69 (!) 134/96  Pulse: 79 79 73 75  Resp: 14  18 17   Temp: 98.2 F (36.8 C)  98 F (36.7 C) 97.8 F (36.6 C)  TempSrc:    Oral  SpO2: 98%  100% 93%  Weight:      Height:        Intake/Output Summary (Last 24 hours) at 01/26/2021 0745 Last data filed at 01/26/2021 0600 Gross per 24 hour  Intake 1020 ml  Output 0 ml  Net 1020 ml   Filed Weights   01/25/21 1131  Weight: 71.6 kg    Examination:  General:  Pleasantly resting in bed, No acute distress. HEENT:  Normocephalic atraumatic.  Sclerae nonicteric, noninjected.  Extraocular movements intact bilaterally. Neck:  Without mass or deformity.  Trachea is midline. Lungs:  Clear to auscultate bilaterally without rhonchi, wheeze, or rales. Heart:  Regular rate and rhythm.  Without murmurs, rubs, or gallops. Abdomen:  Soft, nontender, nondistended.  Without guarding or rebound. Extremities: Without cyanosis, clubbing, edema, or obvious deformity. Vascular:  Dorsalis pedis and posterior tibial pulses palpable bilaterally. Skin:  Warm and dry, no erythema, no ulcerations.    Data Reviewed: I  have personally reviewed following labs and imaging studies  CBC: Recent Labs  Lab 01/19/21 1729 01/20/21 0327 01/25/21 1146 01/25/21 1600 01/26/21 0150  WBC 1.8* 1.8* 2.7* 3.1* 3.6*  NEUTROABS 1.0*  --  1.5*  --   --   HGB 7.3* 7.5* 8.6* 6.1* 7.4*  HCT 24.0* 24.4* 28.3* 20.4* 23.0*  MCV 107.6* 105.2* 109.3* 111.5* 105.0*  PLT 83* 107* 121* 123* 116*   Basic Metabolic Panel: Recent Labs  Lab 01/19/21 1729 01/20/21 0327 01/25/21 1146 01/26/21 0150  NA 135 135 141 136  K 3.8 3.7 3.8 4.2  CL 95* 93* 95* 96*  CO2 30 33* 31 28  GLUCOSE 127* 190* 76 89  BUN 54* 54* 43* 51*  CREATININE 7.49* 8.04* 9.22* 9.92*  CALCIUM 7.8* 8.0* 8.6* 8.0*  MG  --   --  2.1 2.0  PHOS  --  5.6*  --  8.3*   GFR: Estimated Creatinine Clearance: 7 mL/min (A) (by C-G formula based on SCr of 9.92 mg/dL (H)). Liver Function Tests: Recent Labs  Lab 01/19/21 1729 01/20/21 0327 01/26/21 0150  AST 12*  --  14*  ALT 8  --  11  ALKPHOS 52  --  52  BILITOT 0.9  --  0.8  PROT 7.1  --  6.2*  ALBUMIN 3.2* 3.1* 2.9*   No results for input(s): LIPASE, AMYLASE in the last 168 hours. Recent Labs  Lab 01/19/21 1729  AMMONIA 34   Coagulation Profile: No results for input(s): INR, PROTIME in the last 168 hours. Cardiac Enzymes: No results for input(s): CKTOTAL, CKMB, CKMBINDEX, TROPONINI in the last 168 hours. BNP (last 3 results) No results for input(s): PROBNP in the last 8760 hours. HbA1C: No results for input(s): HGBA1C in the last 72 hours. CBG: Recent Labs  Lab 01/20/21 0241 01/20/21 0623 01/20/21 1203 01/20/21 1347 01/20/21 1531  GLUCAP 93 88 155* 126* 119*   Lipid Profile: No results for input(s): CHOL, HDL, LDLCALC, TRIG, CHOLHDL, LDLDIRECT in the last 72 hours. Thyroid Function Tests: Recent Labs    01/25/21 1651  TSH 4.029   Anemia Panel: Recent Labs    01/25/21 1651 01/26/21 0150  VITAMINB12 1,212*  --   FOLATE  --  96.0  FERRITIN  --  160  TIBC  --  344  IRON  --   178   Sepsis Labs: No results for input(s): PROCALCITON, LATICACIDVEN in the last 168 hours.  Recent Results (from the past 240 hour(s))  SARS CORONAVIRUS 2 (TAT 6-24 HRS) Nasopharyngeal Nasopharyngeal Swab     Status: None   Collection Time: 01/20/21  2:42 AM   Specimen: Nasopharyngeal Swab  Result Value Ref Range Status   SARS Coronavirus 2 NEGATIVE NEGATIVE Final    Comment: (NOTE) SARS-CoV-2 target nucleic acids are NOT  DETECTED.  The SARS-CoV-2 RNA is generally detectable in upper and lower respiratory specimens during the acute phase of infection. Negative results do not preclude SARS-CoV-2 infection, do not rule out co-infections with other pathogens, and should not be used as the sole basis for treatment or other patient management decisions. Negative results must be combined with clinical observations, patient history, and epidemiological information. The expected result is Negative.  Fact Sheet for Patients: HairSlick.no  Fact Sheet for Healthcare Providers: quierodirigir.com  This test is not yet approved or cleared by the [place] FDA and  has been authorized for detection and/or diagnosis of SARS-CoV-2 by FDA under an Emergency Use Authorization (EUA). This EUA will remain  in effect (meaning this test can be used) for the duration of the COVID-19 declaration under Se ction 564(b)(1) of the Act, 21 U.S.C. section 360bbb-3(b)(1), unless the authorization is terminated or revoked sooner.  Performed at [facility], [address], [place], [place] [address]   Resp Panel by RT-PCR (Flu A&B, Covid) Nasopharyngeal Swab     Status: None   Collection Time: 01/25/21  4:07 PM   Specimen: Nasopharyngeal Swab; Nasopharyngeal(NP) swabs in vial transport medium  Result Value Ref Range Status   SARS Coronavirus 2 by RT PCR NEGATIVE NEGATIVE Final    Comment: (NOTE) SARS-CoV-2 target nucleic acids are NOT  DETECTED.  The SARS-CoV-2 RNA is generally detectable in upper respiratory specimens during the acute phase of infection. The lowest concentration of SARS-CoV-2 viral copies this assay can detect is 138 copies/mL. A negative result does not preclude SARS-Cov-2 infection and should not be used as the sole basis for treatment or other patient management decisions. A negative result may occur with  improper specimen collection/handling, submission of specimen other than nasopharyngeal swab, presence of viral mutation(s) within the areas targeted by this assay, and inadequate number of viral copies(<138 copies/mL). A negative result must be combined with clinical observations, patient history, and epidemiological information. The expected result is Negative.  Fact Sheet for Patients:  BloggerCourse.com  Fact Sheet for Healthcare Providers:  SeriousBroker.it  This test is no t yet approved or cleared by the [place] FDA and  has been authorized for detection and/or diagnosis of SARS-CoV-2 by FDA under an Emergency Use Authorization (EUA). This EUA will remain  in effect (meaning this test can be used) for the duration of the COVID-19 declaration under Section 564(b)(1) of the Act, 21 U.S.C.section 360bbb-3(b)(1), unless the authorization is terminated  or revoked sooner.       Influenza A by PCR NEGATIVE NEGATIVE Final   Influenza B by PCR NEGATIVE NEGATIVE Final    Comment: (NOTE) The Xpert Xpress SARS-CoV-2/FLU/RSV plus assay is intended as an aid in the diagnosis of influenza from Nasopharyngeal swab specimens and should not be used as a sole basis for treatment. Nasal washings and aspirates are unacceptable for Xpert Xpress SARS-CoV-2/FLU/RSV testing.  Fact Sheet for Patients: BloggerCourse.com  Fact Sheet for Healthcare Providers: SeriousBroker.it  This test is not yet  approved or cleared by the [place] FDA and has been authorized for detection and/or diagnosis of SARS-CoV-2 by FDA under an Emergency Use Authorization (EUA). This EUA will remain in effect (meaning this test can be used) for the duration of the COVID-19 declaration under Section 564(b)(1) of the Act, 21 U.S.C. section 360bbb-3(b)(1), unless the authorization is terminated or revoked.  Performed at [facility], [address], [place], [place] [address]  Radiology Studies: DG Chest Portable 1 View  Result Date: 01/25/2021 CLINICAL DATA:  Weakness.  On dialysis. EXAM: PORTABLE CHEST 1 VIEW COMPARISON:  01/19/2021 FINDINGS: The cardiac silhouette remains moderately enlarged, unchanged. Aortic atherosclerosis is noted. No airspace consolidation, edema, pleural effusion, pneumothorax is identified. No acute osseous abnormality is seen. IMPRESSION: No active disease. Electronically Signed   By: Sebastian Ache M.D.   On: 01/25/2021 12:54     Scheduled Meds:  amiodarone  200 mg Oral Daily   amLODipine  2.5 mg Oral Daily   carvedilol  6.25 mg Oral BID WC   folic acid  1 mg Oral Daily   multivitamin with minerals  1 tablet Oral Daily   pantoprazole (PROTONIX) IV  40 mg Intravenous Q12H   rosuvastatin  10 mg Oral Daily   sevelamer carbonate  3,200 mg Oral TID WC   thiamine  100 mg Oral Daily   Or   thiamine  100 mg Intravenous Daily    LOS: 1 day   Time spent:  Azucena Fallen, DO [facility]  If 7PM-7AM, please contact night-coverage [email or web address]  01/26/2021, 7:45 AM

## 2021-01-26 NOTE — Plan of Care (Signed)
  Problem: Education: Goal: Knowledge of General Education information will improve Description: Including pain rating scale, medication(s)/side effects and non-pharmacologic comfort measures Outcome: Completed/Met

## 2021-01-26 NOTE — Plan of Care (Signed)
  Problem: Health Behavior/Discharge Planning: Goal: Ability to manage health-related needs will improve Outcome: Progressing   Problem: Clinical Measurements: Goal: Ability to maintain clinical measurements within normal limits will improve Outcome: Progressing   Problem: Activity: Goal: Risk for activity intolerance will decrease Outcome: Progressing   Problem: Nutrition: Goal: Adequate nutrition will be maintained Outcome: Progressing   Problem: Pain Managment: Goal: General experience of comfort will improve Outcome: Progressing   Problem: Safety: Goal: Ability to remain free from injury will improve Outcome: Progressing   Problem: Education: Goal: Knowledge of disease and its progression will improve Outcome: Progressing

## 2021-01-26 NOTE — Progress Notes (Signed)
Hemodialysis- Tolerated treatment well. UF 3.1L without issue. Goal reduced x1 due to hypotension. Patient currently is without complaints. Report called to 42M

## 2021-01-26 NOTE — Plan of Care (Signed)
  Problem: Health Behavior/Discharge Planning: Goal: Ability to manage health-related needs will improve Outcome: Completed/Met   Problem: Clinical Measurements: Goal: Will remain free from infection Outcome: Completed/Met Goal: Diagnostic test results will improve Outcome: Completed/Met Goal: Respiratory complications will improve Outcome: Completed/Met

## 2021-01-26 NOTE — Consult Note (Signed)
[facility] Renal Consultation Note    Indication for Consultation:  Management of ESRD/hemodialysis; anemia, hypertension/volume and secondary hyperparathyroidism  HPI: Raymond [name] is a 65 y.o. male with a PMH significant for HTN, PAD, CAD, hep C (previously treated), COPD, h/o small bowel AVM's, substance abuse, and ESRD on HD every TTS at [facility] who was sent to [facility] ED by his primary care doctor's office due to a low Hgb of 5.7.  In the ED he was complaining of DOE, dizziness, and lightheadedness.  Repeat labs were notable for Hgb of 8.6 but repeat was 6.1.  He was admitted for blood transfusion and further evaluation.   We were consulted to provide HD during this hospitalization.    He denied any melena, hematochezia, or BRBPR.  Past Medical History:  Diagnosis Date   Anemia    CAD (coronary artery disease)    a. 03/2018: cath showing 28 to 30% stenosis along the LAD, luminal irregularities along the RCA, and angiographically normal LCx   COPD (chronic obstructive pulmonary disease) (HCC)    ESRD (end stage renal disease) on dialysis [facility])    "MWF; Jeneen Rinks" (07/22/17)   Hepatitis C    Still positive s/p liver biopsy at [facility]  and interferon therapy for 6 months. Most recent lab work was on 10/24/12   Hepatitis C    "took the tx; gone now" (12/05/2016)  Was treated   History of blood transfusion ~ 2012/2013   "related to my kidneys; blood was low"   Hypertension    Peripheral arterial disease ([place])    Substance abuse ([place])    Thyroid disease    Past Surgical History:  Procedure Laterality Date   ABDOMINAL AORTOGRAM W/LOWER EXTREMITY N/A 02/08/2018   Procedure: ABDOMINAL AORTOGRAM W/LOWER EXTREMITY;  Surgeon: Conrad Lyndon, MD;  Location: [place] CV LAB;  Service: Cardiovascular;  Laterality: N/A;   ABDOMINAL AORTOGRAM W/LOWER EXTREMITY Bilateral 06/15/2020   Procedure: ABDOMINAL AORTOGRAM W/LOWER EXTREMITY;  Surgeon: Waynetta Sandy, MD;  Location: [place] CV LAB;  Service: Cardiovascular;  Laterality: Bilateral;   AV FISTULA PLACEMENT Left Aug. 2013 ?   BIOPSY  01/17/2020   Procedure: BIOPSY;  Surgeon: Jackquline Denmark, MD;  Location: [place];  Service: Endoscopy;;   COLONOSCOPY WITH PROPOFOL N/A 08/21/2018   Procedure: COLONOSCOPY WITH PROPOFOL;  Surgeon: Yetta Flock, MD;  Location: [place];  Service: Gastroenterology;  Laterality: N/A;   ENTEROSCOPY N/A 01/17/2020   Procedure: ENTEROSCOPY;  Surgeon: Jackquline Denmark, MD;  Location: [facility] ENDOSCOPY;  Service: Endoscopy;  Laterality: N/A;   ENTEROSCOPY N/A 07/31/2020   Procedure: ENTEROSCOPY;  Surgeon: Doran Stabler, MD;  Location: [place];  Service: Gastroenterology;  Laterality: N/A;   ESOPHAGOGASTRODUODENOSCOPY (EGD) WITH PROPOFOL N/A 08/20/2018   Procedure: ESOPHAGOGASTRODUODENOSCOPY (EGD) WITH PROPOFOL;  Surgeon: Gatha Mayer, MD;  Location: [place];  Service: Endoscopy;  Laterality: N/A;   FEMORAL-POPLITEAL BYPASS GRAFT Left 04/11/2018   Procedure: BYPASS GRAFT FEMORAL-POPLITEAL ARTERY LEFT LEG;  Surgeon: Rosetta Posner, MD;  Location: [facility] OR;  Service: Vascular;  Laterality: Left;   GIVENS CAPSULE STUDY N/A 07/31/2020   Procedure: GIVENS CAPSULE STUDY;  Surgeon: Doran Stabler, MD;  Location: [place];  Service: Gastroenterology;  Laterality: N/A;   HOT HEMOSTASIS N/A 08/20/2018   Procedure: HOT HEMOSTASIS (ARGON PLASMA COAGULATION/BICAP);  Surgeon: Gatha Mayer, MD;  Location: [facility] ENDOSCOPY;  Service: Endoscopy;  Laterality: N/A;   HOT HEMOSTASIS N/A 01/17/2020   Procedure: HOT  HEMOSTASIS (ARGON PLASMA COAGULATION/BICAP);  Surgeon: Jackquline Denmark, MD;  Location: [facility] ENDOSCOPY;  Service: Endoscopy;  Laterality: N/A;   LEFT HEART CATH AND CORONARY ANGIOGRAPHY N/A 03/29/2018   Procedure: LEFT HEART CATH AND CORONARY ANGIOGRAPHY;  Surgeon: Nelva Bush, MD;  Location: [place] CV LAB;  Service: Cardiovascular;  Laterality: N/A;   LIVER BIOPSY     ORIF  ULNAR FRACTURE Left 05/18/2017   Procedure: OPEN REDUCTION INTERNAL FIXATION (ORIF) ULNAR FRACTURE;  Surgeon: Iran Planas, MD;  Location: [place];  Service: Orthopedics;  Laterality: Left;   PERIPHERAL VASCULAR INTERVENTION Right 06/15/2020   Procedure: PERIPHERAL VASCULAR INTERVENTION;  Surgeon: Waynetta Sandy, MD;  Location: [place] CV LAB;  Service: Cardiovascular;  Laterality: Right;   POLYPECTOMY  08/21/2018   Procedure: POLYPECTOMY;  Surgeon: Yetta Flock, MD;  Location: [place];  Service: Gastroenterology;;   Earney Mallet N/A 12/11/2012   Procedure: Earney Mallet;  Surgeon: Serafina Mitchell, MD;  Location[facility] CATH LAB;  Service: Cardiovascular;  Laterality: N/A;   WOUND EXPLORATION Left 06/15/2020   Procedure: GROIN  EXPLORATION;  Surgeon: Waynetta Sandy, MD;  Location: [facility] OR;  Service: Vascular;  Laterality: Left;   Family History:   Family History  Problem Relation Age of Onset   Diabetes Father    Hypertension Father    Heart disease Father    Social History:  reports that he has been smoking cigarettes. He has a 5.00 pack-year smoking history. He has never used smokeless tobacco. He reports current alcohol use of about 10.0 standard drinks of alcohol per week. He reports previous drug use. Drug: Cocaine. No Known Allergies Prior to Admission medications   Medication Sig Start Date End Date Taking? Authorizing Provider  acetaminophen (TYLENOL) 325 MG tablet Take 650 mg by mouth every 6 (six) hours as needed for mild pain.   Yes [provider]  albuterol (VENTOLIN HFA) 108 (90 Base) MCG/ACT inhaler Inhale 2 puffs into the lungs every 4 (four) hours as needed for shortness of breath or wheezing. 11/06/19  Yes [provider]  amiodarone (PACERONE) 200 MG tablet Take 1 tablet (200 mg total) by mouth daily. 12/11/20  Yes Mercy Riding, MD  amLODipine (NORVASC) 2.5 MG tablet Take 2.5 mg by mouth daily. 01/20/21  Yes [provider]   calcitRIOL (ROCALTROL) 0.25 MCG capsule Take 9 capsules (2.25 mcg total) by mouth Every Tuesday,Thursday,and Saturday with dialysis. 12/12/20  Yes Mercy Riding, MD  carvedilol (COREG) 6.25 MG tablet Take 1 tablet (6.25 mg total) by mouth 2 (two) times daily with a meal. 12/11/20  Yes Mercy Riding, MD  cinacalcet (SENSIPAR) 30 MG tablet Take 6 tablets (180 mg total) by mouth Every Tuesday,Thursday,and Saturday with dialysis. 12/12/20  Yes Mercy Riding, MD  folic acid (FOLVITE) 1 MG tablet Take 1 tablet (1 mg total) by mouth daily. 12/12/20  Yes Mercy Riding, MD  Multiple Vitamin (MULTIVITAMIN WITH MINERALS) TABS tablet Take 1 tablet by mouth daily. 12/12/20  Yes Mercy Riding, MD  nicotine (NICODERM CQ - DOSED IN MG/24 HOURS) 14 mg/24hr patch Place 1 patch (14 mg total) onto the skin daily. 12/12/20  Yes Mercy Riding, MD  pantoprazole (PROTONIX) 40 MG tablet Take 40 mg by mouth daily.   Yes [provider]  prochlorperazine (COMPAZINE) 5 MG tablet Take 1 tablet (5 mg total) by mouth every 8 (eight) hours as needed for nausea or vomiting. 12/11/20  Yes Mercy Riding, MD  QUEtiapine (SEROQUEL)  100 MG tablet Take 100 mg by mouth at bedtime. 01/25/21  Yes [provider]  rosuvastatin (CRESTOR) 10 MG tablet Take 1 tablet (10 mg total) by mouth daily. 12/11/20 06/09/21 Yes Mercy Riding, MD  sevelamer carbonate (RENVELA) 800 MG tablet Take 4 tablets (3,200 mg total) by mouth 3 (three) times daily with meals. Patient taking differently: Take 4,000 mg by mouth 3 (three) times daily with meals. And take 4000 mg with snacks. 09/14/18  Yes Vasireddy, Grier Mitts, MD  thiamine 100 MG tablet Take 1 tablet (100 mg total) by mouth daily. 12/12/20  Yes Mercy Riding, MD  apixaban (ELIQUIS) 5 MG TABS tablet Take 1 tablet (5 mg total) by mouth 2 (two) times daily. 06/22/20 08/03/20  Karoline Caldwell, PA-C   Current Facility-Administered Medications  Medication Dose Route Frequency Provider Last Rate Last Admin   0.9 %   sodium chloride infusion  100 mL Intravenous PRN Donato Heinz, MD       0.9 %  sodium chloride infusion  100 mL Intravenous PRN Donato Heinz, MD       acetaminophen (TYLENOL) tablet 650 mg  650 mg Oral Q6H PRN Elodia Florence., MD       Or   acetaminophen (TYLENOL) suppository 650 mg  650 mg Rectal Q6H PRN Elodia Florence., MD       albuterol (PROVENTIL) (2.5 MG/3ML) 0.083% nebulizer solution 3 mL  3 mL Inhalation Q4H PRN Elodia Florence., MD       amiodarone (PACERONE) tablet 200 mg  200 mg Oral Daily Elodia Florence., MD       amLODipine (NORVASC) tablet 2.5 mg  2.5 mg Oral Daily Elodia Florence., MD       carvedilol (COREG) tablet 6.25 mg  6.25 mg Oral BID WC Elodia Florence., MD   6.25 mg at 01/25/21 2102   Chlorhexidine Gluconate Cloth 2 % PADS 6 each  6 each Topical Q0600 Donato Heinz, MD       folic acid (FOLVITE) tablet 1 mg  1 mg Oral Daily Elodia Florence., MD   1 mg at 01/25/21 1812   lidocaine (PF) (XYLOCAINE) 1 % injection 5 mL  5 mL Intradermal PRN Donato Heinz, MD       lidocaine-prilocaine (EMLA) cream 1 application  1 application Topical PRN Donato Heinz, MD       LORazepam (ATIVAN) tablet 1-4 mg  1-4 mg Oral Q1H PRN Elodia Florence., MD   2 mg at 01/25/21 2108   Or   LORazepam (ATIVAN) injection 1-4 mg  1-4 mg Intravenous Q1H PRN Elodia Florence., MD       multivitamin with minerals tablet 1 tablet  1 tablet Oral Daily Elodia Florence., MD   1 tablet at 01/25/21 1812   pantoprazole (PROTONIX) EC tablet 40 mg  40 mg Oral Q0600 Vena Rua, PA-C       pentafluoroprop-tetrafluoroeth (GEBAUERS) aerosol 1 application  1 application Topical PRN Donato Heinz, MD       polyethylene glycol (MIRALAX / GLYCOLAX) packet 17 g  17 g Oral Daily PRN Elodia Florence., MD       rosuvastatin (CRESTOR) tablet 10 mg  10 mg Oral Daily Elodia Florence., MD       sevelamer carbonate  (RENVELA) tablet 3,200 mg  3,200 mg Oral TID WC Elodia Florence., MD   3,200 mg  at 01/26/21 0816   sevelamer carbonate (RENVELA) tablet 4,000 mg  4,000 mg Oral PRN Elodia Florence., MD       thiamine tablet 100 mg  100 mg Oral Daily Elodia Florence., MD   100 mg at 01/25/21 1610   Or   thiamine (B-1) injection 100 mg  100 mg Intravenous Daily Elodia Florence., MD       Facility-Administered Medications Ordered in Other Encounters  Medication Dose Route Frequency Provider Last Rate Last Admin   midazolam (VERSED) 5 MG/5ML injection    Anesthesia Intra-op Sammie Bench, CRNA   2 mg at 04/11/18 1042   Labs: Basic Metabolic Panel: Recent Labs  Lab 01/20/21 0327 01/25/21 1146 01/26/21 0150  NA 135 141 136  K 3.7 3.8 4.2  CL 93* 95* 96*  CO2 33* 31 28  GLUCOSE 190* 76 89  BUN 54* 43* 51*  CREATININE 8.04* 9.22* 9.92*  CALCIUM 8.0* 8.6* 8.0*  PHOS 5.6*  --  8.3*   Liver Function Tests: Recent Labs  Lab 01/19/21 1729 01/20/21 0327 01/26/21 0150  AST 12*  --  14*  ALT 8  --  11  ALKPHOS 52  --  52  BILITOT 0.9  --  0.8  PROT 7.1  --  6.2*  ALBUMIN 3.2* 3.1* 2.9*   No results for input(s): LIPASE, AMYLASE in the last 168 hours. Recent Labs  Lab 01/19/21 1729  AMMONIA 34   CBC: Recent Labs  Lab 01/19/21 1729 01/20/21 0327 01/25/21 1146 01/25/21 1600 01/26/21 0150  WBC 1.8* 1.8* 2.7* 3.1* 3.6*  NEUTROABS 1.0*  --  1.5*  --   --   HGB 7.3* 7.5* 8.6* 6.1* 7.4*  HCT 24.0* 24.4* 28.3* 20.4* 23.0*  MCV 107.6* 105.2* 109.3* 111.5* 105.0*  PLT 83* 107* 121* 123* 116*   Cardiac Enzymes: No results for input(s): CKTOTAL, CKMB, CKMBINDEX, TROPONINI in the last 168 hours. CBG: Recent Labs  Lab 01/20/21 0241 01/20/21 0623 01/20/21 1203 01/20/21 1347 01/20/21 1531  GLUCAP 93 88 155* 126* 119*   Iron Studies:  Recent Labs    01/26/21 0150  IRON 178  TIBC 344  FERRITIN 160   Studies/Results: DG Chest Portable 1 View  Result Date:  01/25/2021 CLINICAL DATA:  Weakness.  On dialysis. EXAM: PORTABLE CHEST 1 VIEW COMPARISON:  01/19/2021 FINDINGS: The cardiac silhouette remains moderately enlarged, unchanged. Aortic atherosclerosis is noted. No airspace consolidation, edema, pleural effusion, pneumothorax is identified. No acute osseous abnormality is seen. IMPRESSION: No active disease. Electronically Signed   By: Logan Bores M.D.   On: 01/25/2021 12:54    ROS: Pertinent items are noted in HPI. Physical Exam: Vitals:   01/25/21 2106 01/26/21 0019 01/26/21 0520 01/26/21 1129  BP: (!) 144/93 106/69 (!) 134/96 129/89  Pulse: 79 73 75 84  Resp:  18 17 18   Temp:  98 F (36.7 C) 97.8 F (36.6 C) 98.3 F (36.8 C)  TempSrc:   Oral Oral  SpO2:  100% 93% 95%  Weight:      Height:          Weight change:   Intake/Output Summary (Last 24 hours) at 01/26/2021 1144 Last data filed at 01/26/2021 0600 Gross per 24 hour  Intake 1020 ml  Output 0 ml  Net 1020 ml   BP 129/89 (BP Location: Right Arm)   Pulse 84   Temp 98.3 F (36.8 C) (Oral)   Resp 18   Ht 5'  8" (1.727 m)   Wt 71.6 kg   SpO2 95%   BMI 24.00 kg/m  General appearance: alert, cooperative, and no distress Head: Normocephalic, without obvious abnormality, atraumatic Resp: clear to auscultation bilaterally Cardio: regular rate and rhythm, S1, S2 normal, no murmur, click, rub or gallop GI: soft, non-tender; bowel sounds normal; no masses,  no organomegaly Extremities: extremities normal, atraumatic, no cyanosis or edema and LAVF +T/B with aneurysmal changes throughout. Dialysis Access:  Outpatient Dialysis Orders: Center: [place]  on TTS. 180NRe, TTS, 4 hours, BFR 450, DFR Auto 1.5, EDW 69 kg, 2K/2Ca, AVF15g No heparin, profile 4 Mircera 225 mcg IV q 2 weeks  Calcitriol 2.5 mcg PO q HD Sensipar 180mg  PO q HD  Assessment/Plan:  Symptomatic anemia - presumably due to small bowel AVM's.  S/p blood transfusion and improved to 7.4.  Gi consulted and awaiting  there recommendations but likely small bowel enteroscopy.   ESRD -  continue with TTS schedule without heparin.  Hypertension/volume  - stable  Anemia  - as above s/p blood transfusion.  Metabolic bone disease -  continue with home meds  Nutrition - renal diet.   Donetta Potts, MD [place] Pager [phone] 01/26/2021, 11:44 AM

## 2021-01-26 NOTE — Consult Note (Addendum)
[facility] Consult: 9:12 AM 01/26/2021  LOS: 1 day    Referring Provider: Dr Avon Gully  Primary Care Physician:  [name], FNP Primary Gastroenterologist:  Dr. Havery Moros.       Reason for Consultation:  recurrent acute on chronic anemia.     HPI: Raymond [name] is a 65 y.o. male.  Hx PAF, on Eliquis.  Eradicated hep C.  ESRD on HD MWF.  CAD.  Diastolic heart failure.  PVD, s/p L fem-pop 2019. S/p 06/2020 stenting of right common and external iliac arteries, later that I required evacuation of left groin and scrotal hematoma and repair left common femoral artery cannulation.  COPD.  Thrombocytopenia.  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  Previous transfusions for anemia from blood loss and CKD. 1 PRBC in 08/2018, 3 PRBCs in 01/28/2020, 2 PRBCs in 06/29/2020,  1 PRBC 07/2020.    08/2018 EGD.  For nonbleeding duodenal AVMs treated with APC. 08/2018 colonoscopy with adequate prep revealed diverticulosis, nonbleeding hemorrhoids, several small adenomatous polyps removed. 01/2020 SBE.  Solitary nonbleeding gastric AVM treated with APC.  Biopsy of nodular mucosa in D1 (path: peptic duodenitis).  6, nonbleeding AVMs in the distal duodenum, proximal and mid jejunum were all treated with APC.  MD recommended VCE if recurrent bleeding. 01/2020 CTAP w angio: No active GI bleed.  Thickened GB wall with possible small pericholecystic fluid, findings may be related to underlying liver disease though cholecystitis not excluded.  Scattered sigmoid diverticulosis.  No liver disease or biliary duct abnormalities. 07/31/2020 SBE.  Normal esophagus, stomach, duodenum, examined jejunum.  No pathology or sources for blood loss.  Unable to endoscopically placed VCE at the time of the procedure due to patient coughing. 07/31/20 VCE: Pt swallowed  capsule.  Study limited as capsule retained in esophagus and then stomach for 10 hours and the patient ate within 4 hours of the study so there was retained food in the stomach and small bowel which limited visualization.  2, small, nonbleeding AVMs seen in distal small bowel, "clearly beyond the reach of the enteroscopy".  The entire small bowel was not visualized before end of battery life.  Eliquis discontinued in 07/2019.  Plavix discontinued 2 or 3 weeks ago.  Not on aspirin. Pantoprazole discontinued as of 12/2020.     Admission 7/12 -01/20/2021 w AMS, serum glucose 35, nonacute brain MRI.  Hgb then was 7.3 (10-11 in early June 2022) , MCV 107.  For several days feeling weak, fatigued, experiencing dyspnea on exertion.  Heaviness in his legs.  On Saturday, at dialysis advised to go to the hospital for evaluation of low blood counts but he did not feel poorly enough to go and followed up w PCP office on Monday.  By that time his symptoms had progressed but was not having angina.  Repeat labs again consistent with anemia and patient sent to the ED via EMS.  About a week ago, for 3 or 4 days, patient had tarry black stools 3 or 4 times a day.  This resolved and he resumed his  brown stools once or twice daily.  In recent weeks has been having more frequent postprandial nausea and vomiting of partially digested foods but no blood or coffee-ground material seen.  No abdominal pain.  No sense of reflux.  No dysphagia.  Weight stable. At HD receives Mircera q 2 weeks.    Hgb 8.6 >> 6.1 >> 1 PRBC >> 7.4.  MCV 111.  Platelets 116 (83 one week ago).  Chemistries consistent with ESRD.  LFTs normal.  Iron, TIBC, iron sats, ferritin, folate, B12 at or above normal. CXR unremarkable.  Pt denies family history of colorectal cancer, ulcer disease, GI bleeds. A couple of times a week drinks a pint of wine.  Smokes 6 cigarettes daily.  Occasionally smokes cocaine, last use was 2 to 3 weeks ago.    Past Medical  History:  Diagnosis Date   Anemia    CAD (coronary artery disease)    a. 03/2018: cath showing 62 to 30% stenosis along the LAD, luminal irregularities along the RCA, and angiographically normal LCx   COPD (chronic obstructive pulmonary disease) (HCC)    ESRD (end stage renal disease) on dialysis [facility])    "MWF; Jeneen Rinks" (07/22/17)   Hepatitis C    Still positive s/p liver biopsy at [facility]  and interferon therapy for 6 months. Most recent lab work was on 10/24/12   Hepatitis C    "took the tx; gone now" (12/05/2016)  Was treated   History of blood transfusion ~ 2012/2013   "related to my kidneys; blood was low"   Hypertension    Peripheral arterial disease ([place])    Substance abuse ([place])    Thyroid disease     Past Surgical History:  Procedure Laterality Date   ABDOMINAL AORTOGRAM W/LOWER EXTREMITY N/A 02/08/2018   Procedure: ABDOMINAL AORTOGRAM W/LOWER EXTREMITY;  Surgeon: Conrad St. Bernard, MD;  Location: [place] CV LAB;  Service: Cardiovascular;  Laterality: N/A;   ABDOMINAL AORTOGRAM W/LOWER EXTREMITY Bilateral 06/15/2020   Procedure: ABDOMINAL AORTOGRAM W/LOWER EXTREMITY;  Surgeon: Waynetta Sandy, MD;  Location: [place] CV [facility];  Service: Cardiovascular;  Laterality: Bilateral;   AV FISTULA PLACEMENT Left Aug. 2013 ?   BIOPSY  01/17/2020   Procedure: BIOPSY;  Surgeon: Jackquline Denmark, MD;  Location: [place];  Service: Endoscopy;;   COLONOSCOPY WITH PROPOFOL N/A 08/21/2018   Procedure: COLONOSCOPY WITH PROPOFOL;  Surgeon: Yetta Flock, MD;  Location: [place];  Service: Gastroenterology;  Laterality: N/A;   ENTEROSCOPY N/A 01/17/2020   Procedure: ENTEROSCOPY;  Surgeon: Jackquline Denmark, MD;  Location: [facility] ENDOSCOPY;  Service: Endoscopy;  Laterality: N/A;   ENTEROSCOPY N/A 07/31/2020   Procedure: ENTEROSCOPY;  Surgeon: Doran Stabler, MD;  Location: [place];  Service: Gastroenterology;  Laterality: N/A;   ESOPHAGOGASTRODUODENOSCOPY (EGD) WITH PROPOFOL N/A  08/20/2018   Procedure: ESOPHAGOGASTRODUODENOSCOPY (EGD) WITH PROPOFOL;  Surgeon: Gatha Mayer, MD;  Location: [place];  Service: Endoscopy;  Laterality: N/A;   FEMORAL-POPLITEAL BYPASS GRAFT Left 04/11/2018   Procedure: BYPASS GRAFT FEMORAL-POPLITEAL ARTERY LEFT LEG;  Surgeon: Rosetta Posner, MD;  Location: [facility] OR;  Service: Vascular;  Laterality: Left;   GIVENS CAPSULE STUDY N/A 07/31/2020   Procedure: GIVENS CAPSULE STUDY;  Surgeon: Doran Stabler, MD;  Location: [place];  Service: Gastroenterology;  Laterality: N/A;   HOT HEMOSTASIS N/A 08/20/2018   Procedure: HOT HEMOSTASIS (ARGON PLASMA COAGULATION/BICAP);  Surgeon: Gatha Mayer, MD;  Location: [facility] ENDOSCOPY;  Service: Endoscopy;  Laterality: N/A;   HOT  HEMOSTASIS N/A 01/17/2020   Procedure: HOT HEMOSTASIS (ARGON PLASMA COAGULATION/BICAP);  Surgeon: Jackquline Denmark, MD;  Location[facility] END[facility]COPY;  Service: Endoscopy;  Laterality: N/A;   LEFT HEART CATH AND CORONARY ANGIOGRAPHY N/A 03/29/2018   Procedure: LEFT HEART CATH AND CORONARY ANGIOGRAPHY;  Surgeon: Nelva Bush, MD;  Location: [place] CV LAB;  Service: Cardiovascular;  Laterality: N/A;   LIVER BIOPSY     ORIF ULNAR FRACTURE Left 05/18/2017   Procedure: OPEN REDUCTION INTERNAL FIXATION (ORIF) ULNAR FRACTURE;  Surgeon: Iran Planas, MD;  Location: [place];  Service: Orthopedics;  Laterality: Left;   PERIPHERAL VASCULAR INTERVENTION Right 06/15/2020   Procedure: PERIPHERAL VASCULAR INTERVENTION;  Surgeon: Waynetta Sandy, MD;  Location: [place] [facility] LAB;  Service: Cardiovascular;  Laterality: Right;   POLYPECTOMY  08/21/2018   Procedure: POLYPECTOMY;  Surgeon: Yetta Flock, MD;  Location: [place];  Service: Gastroenterology;;   Earney Mallet N/A 12/11/2012   Procedure: [place] Mal[place];  Surgeon: Serafina Mitchell, MD;  Location: [facility] CATH LAB;  Service: Cardiovascular;  Laterality: N/A;   WOUND EXPLORATION Left 06/15/2020   Procedure: GROIN  EXPLORATION;  Surgeon:  Waynetta Sandy, MD;  Location: [place];  Service: Vascular;  Laterality: Left;    Prior to Admission medications   Medication Sig Start Date End Date Taking? Authorizing Provider  acetaminophen (TYLENOL) 325 MG tablet Take 650 mg by mouth every 6 (six) hours as needed for mild pain.   Yes [provider]  albuterol (VENTOLIN HFA) 108 (90 Base) MCG/ACT inhaler Inhale 2 puffs into the lungs every 4 (four) hours as needed for shortness of breath or wheezing. 11/06/19  Yes [provider]  amiodarone (PACERONE) 200 MG tablet Take 1 tablet (200 mg total) by mouth daily. 12/11/20  Yes Mercy Riding, MD  amLODipine (NORVASC) 2.5 MG tablet Take 2.5 mg by mouth daily. 01/20/21  Yes [provider]  calcitRIOL (ROCALTROL) 0.25 MCG capsule Take 9 capsules (2.25 mcg total) by mouth Every Tuesday,Thursday,and Saturday with dialysis. 12/12/20  Yes Mercy Riding, MD  carvedilol (COREG) 6.25 MG tablet Take 1 tablet (6.25 mg total) by mouth 2 (two) times daily with a meal. 12/11/20  Yes Mercy Riding, MD  cinacalcet (SENSIPAR) 30 MG tablet Take 6 tablets (180 mg total) by mouth Every Tuesday,Thursday,and Saturday with dialysis. 12/12/20  Yes Mercy Riding, MD  folic acid (FOLVITE) 1 MG tablet Take 1 tablet (1 mg total) by mouth daily. 12/12/20  Yes Mercy Riding, MD  Multiple Vitamin (MULTIVITAMIN WITH MINERALS) TABS tablet Take 1 tablet by mouth daily. 12/12/20  Yes Mercy Riding, MD  nicotine (NICODERM CQ - DOSED IN MG/24 HOURS) 14 mg/24hr patch Place 1 patch (14 mg total) onto the skin daily. 12/12/20  Yes Mercy Riding, MD  pantoprazole (PROTONIX) 40 MG tablet Take 40 mg by mouth daily.   Yes [provider]  prochlorperazine (COMPAZINE) 5 MG tablet Take 1 tablet (5 mg total) by mouth every 8 (eight) hours as needed for nausea or vomiting. 12/11/20  Yes Mercy Riding, MD  QUEtiapine (SEROQUEL) 100 MG tablet Take 100 mg by mouth at bedtime. 01/25/21  Yes [provider]   rosuvastatin (CRESTOR) 10 MG tablet Take 1 tablet (10 mg total) by mouth daily. 12/11/20 06/09/21 Yes Mercy Riding, MD  sevelamer carbonate (RENVELA) 800 MG tablet Take 4 tablets (3,200 mg total) by mouth 3 (three) times daily with meals. Patient taking differently: Take 4,000 mg by mouth 3 (three)  times daily with meals. And take 4000 mg with snacks. 09/14/18  Yes Vasireddy, Grier Mitts, MD  thiamine 100 MG tablet Take 1 tablet (100 mg total) by mouth daily. 12/12/20  Yes Mercy Riding, MD  apixaban (ELIQUIS) 5 MG TABS tablet Take 1 tablet (5 mg total) by mouth 2 (two) times daily. 06/22/20 08/03/20  Karoline Caldwell, PA-C    Scheduled Meds:  amiodarone  200 mg Oral Daily   amLODipine  2.5 mg Oral Daily   carvedilol  6.25 mg Oral BID WC   Chlorhexidine Gluconate Cloth  6 each Topical K4818   folic acid  1 mg Oral Daily   multivitamin with minerals  1 tablet Oral Daily   pantoprazole (PROTONIX) IV  40 mg Intravenous Q12H   rosuvastatin  10 mg Oral Daily   sevelamer carbonate  3,200 mg Oral TID WC   thiamine  100 mg Oral Daily   Or   thiamine  100 mg Intravenous Daily   Infusions:  PRN Meds: acetaminophen **OR** acetaminophen, albuterol, LORazepam **OR** LORazepam, polyethylene glycol, sevelamer carbonate   Allergies as of 01/25/2021   (No Known Allergies)    Family History  Problem Relation Age of Onset   Diabetes Father    Hypertension Father    Heart disease Father     Social History   Socioeconomic History   Marital status: Single    Spouse name: Not on file   Number of children: 2   Years of education: Not on file   Highest education level: 11th grade  Occupational History   Not on file  Tobacco Use   Smoking status: Every Day    Packs/day: 0.20    Years: 25.00    Pack years: 5.00    Types: Cigarettes    Last attempt to quit: 08/01/2011    Years since quitting: 9.4   Smokeless tobacco: Never   Tobacco comments:    he smokes 6 cigarettes a week  Vaping Use    Vaping Use: Never used  Substance and Sexual Activity   Alcohol use: Yes    Alcohol/week: 10.0 standard drinks    Types: 10 Glasses of wine per week   Drug use: Not Currently    Types: Cocaine   Sexual activity: Not on file  Other Topics Concern   Not on file  Social History Narrative   Patient is living in a apartment alone. Professional cement finisher. Has 2 children (66yo & 57yo sons). Plans to make HCPOA for sister Neoma Laming to make decisions for him should he need them.    Social Determinants of Health   Financial Resource Strain: Not on file  Food Insecurity: Not on file  Transportation Needs: Not on file  Physical Activity: Not on file  Stress: Not on file  Social Connections: Not on file  Intimate Partner Violence: Not on file    REVIEW OF SYSTEMS: Constitutional: Weakness, fatigue. ENT:  No nose bleeds Pulm: Dyspnea on exertion.  No active cough CV:  No palpitations, no LE edema.  No angina PO GU:  No hematuria, no frequency GI: See HPI. Heme: Denies unusual or excessive bleeding or bruising. Transfusions: See HPI for details. Neuro:  No headaches, no peripheral tingling or numbness.  No syncope, no seizures. + dizziness. Derm:  No itching, no rash or sores.  Endocrine:  No sweats or chills.  No polyuria or dysuria Immunization: Vaccination history reviewed.  Vaccinated with Moderna COVID-19 vaccine in February and March 2021, received [place] booster  in October 2021 Travel:  None beyond local counties in last few months.    PHYSICAL EXAM: Vital signs in last 24 hours: Vitals:   01/26/21 0019 01/26/21 0520  BP: 106/69 (!) 134/96  Pulse: 73 75  Resp: 18 17  Temp: 98 F (36.7 C) 97.8 F (36.6 C)  SpO2: 100% 93%   Wt Readings from Last 3 Encounters:  01/25/21 71.6 kg  12/11/20 71.6 kg  07/31/20 72.6 kg    General: Pleasant, not acutely ill-appearing, comfortable.  Alert. Head: No facial asymmetry or swelling.  No signs of head trauma. Eyes: No  scleral icterus.  No conjunctival pallor.  EOMI Ears: Not hard of hearing Nose: No congestion or discharge. Mouth: Tongue midline.  Mucosa pink, moist, clear.  Missing teeth, fair condition of remaining teeth. Neck: No JVD, no masses, no thyromegaly Lungs: Diminished breath sounds globally but clear without adventitious sounds.  No cough.  No dyspnea Heart: RRR.  Rate in the 80s.  No MRG.  S1, S2 present. Abdomen: Soft.  Not tender.  No distention.  No masses, HSM, bruits, hernias.  Bowel sounds active.   Rectal: Deferred.  Stool submitted to lab yesterday afternoon was FOBT negative Musc/Skeltl: No joint redness, swelling or gross deformity. Extremities: No CCE.  Palpable pulse in fistula on left upper extremity. Neurologic: Oriented x3.  Moves all 4 limbs without tremor.  Strength not tested. Skin: No rash, no sores, no suspicious lesions. Nodes: No cervical or inguinal adenopathy Psych: Operative, calm, pleasant.  Normal affect  Intake/Output from previous day: 07/18 0701 - 07/19 0700 In: 1020 [P.O.:240; I.V.:150; Blood:630] Out: 0  Intake/Output this shift: No intake/output data recorded.  LAB RESULTS: Recent Labs    01/25/21 1146 01/25/21 1600 01/26/21 0150  WBC 2.7* 3.1* 3.6*  HGB 8.6* 6.1* 7.4*  HCT 28.3* 20.4* 23.0*  PLT 121* 123* 116*   BMET Lab Results  Component Value Date   NA 136 01/26/2021   NA 141 01/25/2021   NA 135 01/20/2021   K 4.2 01/26/2021   K 3.8 01/25/2021   K 3.7 01/20/2021   CL 96 (L) 01/26/2021   CL 95 (L) 01/25/2021   CL 93 (L) 01/20/2021   CO2 28 01/26/2021   CO2 31 01/25/2021   CO2 33 (H) 01/20/2021   GLUCOSE 89 01/26/2021   GLUCOSE 76 01/25/2021   GLUCOSE 190 (H) 01/20/2021   BUN 51 (H) 01/26/2021   BUN 43 (H) 01/25/2021   BUN 54 (H) 01/20/2021   CREATININE 9.92 (H) 01/26/2021   CREATININE 9.22 (H) 01/25/2021   CREATININE 8.04 (H) 01/20/2021   CALCIUM 8.0 (L) 01/26/2021   CALCIUM 8.6 (L) 01/25/2021   CALCIUM 8.0 (L) 01/20/2021    LFT Recent Labs    01/26/21 0150  PROT 6.2*  ALBUMIN 2.9*  AST 14*  ALT 11  ALKPHOS 52  BILITOT 0.8   PT/INR Lab Results  Component Value Date   INR 1.5 (H) 07/29/2020   INR 1.5 (H) 07/28/2020   INR 1.23 08/19/2018   Hepatitis Panel No results for input(s): HEPBSAG, HCVAB, HEPAIGM, HEPBIGM in the last 72 hours. C-Diff No components found for: CDIFF Lipase     Component Value Date/Time   LIPASE 54 (H) 12/24/2018 1318    Drugs of Abuse     Component Value Date/Time   LABOPIA NONE DETECTED 05/07/2010 1547   COCAINSCRNUR NONE DETECTED 05/07/2010 1547   LABBENZ NONE DETECTED 05/07/2010 1547   AMPHETMU NONE DETECTED 05/07/2010 1547  THCU NONE DETECTED 05/07/2010 1547   LABBARB  05/07/2010 1547    NONE DETECTED        DRUG SCREEN FOR MEDICAL PURPOSES ONLY.  IF CONFIRMATION IS NEEDED FOR ANY PURPOSE, NOTIFY LAB WITHIN 5 DAYS.        LOWEST DETECTABLE LIMITS FOR URINE DRUG SCREEN Drug Class       Cutoff (ng/mL) Amphetamine      1000 Barbiturate      200 Benzodiazepine   315 Tricyclics       400 Opiates          300 Cocaine          300 THC              50     RADIOLOGY STUDIES: DG Chest Portable 1 View  Result Date: 01/25/2021 CLINICAL DATA:  Weakness.  On dialysis. EXAM: PORTABLE CHEST 1 VIEW COMPARISON:  01/19/2021 FINDINGS: The cardiac silhouette remains moderately enlarged, unchanged. Aortic atherosclerosis is noted. No airspace consolidation, edema, pleural effusion, pneumothorax is identified. No acute osseous abnormality is seen. IMPRESSION: No active disease. Electronically Signed   By: Logan Bores M.D.   On: 01/25/2021 12:54      IMPRESSION:   Recurrent acute on chronic anemia.  Tarry stools for a few days last week, now resolved.  Previous GI bleeding attributed to small bowel AVMs.  At latest 07/31/2020 SBE into the jejunum, no AVMs encountered.  However nonbleeding AVMs in distal small bowel noted on subsequent, though suboptimal, 07/31/20 VCE.   These AVMs were well beyond the reach of enteroscope.  S/p 1 PRBC.  Paroxysmal A. fib, peripheral vascular disease.  Previous Eliquis discontinued in January 2022.  Plavix discontinued 2 to 3 weeks ago.      Hx Hep C, eradicated per notes.  However the only HCV quant I see is from 04/2010 and measured 327,000.  Do not see fup labs.  LFTs WNL.  So unclear if this was eradicated.  Based on lifestyle, worth rechecking.   Hep B serolgies negative on multiple occasions, most recently 12/09/20.      Pancytopenia.  Chronic.      Substance abuse.  Tobacco, cocaine, ETOH.      PLAN:       SBE, tomorrow time TBD.  Ok to eat today but NPO after midnight.      Hep C quant in AM.  Switch to po Protonix      Azucena Freed  01/26/2021, 9:12 AM Phone [phone]

## 2021-01-26 NOTE — Care Plan (Signed)
New Admission Note:  Arrival Method: Stretcher Mental Orientation: Alert and oriented x 4 Telemetry: Box 06 NSR Assessment: Completed Skin: Warm and dry IV: NSL Pain: Denies Tubes: N/A Safety Measures: Safety Fall Prevention Plan initiated.  Admission: Completed 5 M  Orientation: Patient has been orientated to the room, unit and the staff. Welcome booklet given.  Family: N/A  Orders have been reviewed and implemented. Will continue to monitor the patient. Call light has been placed within reach and bed alarm has been activated.   Sima Matas BSN, RN  Phone Number: [phone]

## 2021-01-27 ENCOUNTER — Inpatient Hospital Stay (HOSPITAL_COMMUNITY): Payer: 59

## 2021-01-27 ENCOUNTER — Inpatient Hospital Stay (HOSPITAL_COMMUNITY): Payer: 59 | Admitting: Anesthesiology

## 2021-01-27 ENCOUNTER — Encounter (HOSPITAL_COMMUNITY)
Admission: EM | Disposition: A | Discharge status: Home / Self Care | Payer: Self-pay | Source: Home / Self Care | Attending: Internal Medicine

## 2021-01-27 ENCOUNTER — Encounter (HOSPITAL_COMMUNITY): Payer: Self-pay | Admitting: Family Medicine

## 2021-01-27 DIAGNOSIS — R008 Other abnormalities of heart beat: Secondary | ICD-10-CM | POA: Diagnosis not present

## 2021-01-27 DIAGNOSIS — K31819 Angiodysplasia of stomach and duodenum without bleeding: Secondary | ICD-10-CM

## 2021-01-27 HISTORY — PX: HOT HEMOSTASIS: SHX5433

## 2021-01-27 HISTORY — PX: ENTEROSCOPY: SHX5533

## 2021-01-27 LAB — ECHOCARDIOGRAM COMPLETE
AR max vel: 2.97 cm2
AV Area VTI: 2.83 cm2
AV Area mean vel: 2.7 cm2
AV Mean grad: 2 mmHg
AV Peak grad: 3.7 mmHg
Ao pk vel: 0.97 m/s
Height: 68 in
S' Lateral: 3.5 cm
Weight: 2469.15 oz

## 2021-01-27 SURGERY — ENTEROSCOPY
Anesthesia: Monitor Anesthesia Care

## 2021-01-27 MED ORDER — PROPOFOL 10 MG/ML IV BOLUS
INTRAVENOUS | Status: DC | PRN
  Administered 2021-01-27: 20 mg via INTRAVENOUS

## 2021-01-27 MED ORDER — PROPOFOL 500 MG/50ML IV EMUL
INTRAVENOUS | Status: DC | PRN
  Administered 2021-01-27: 150 ug/kg/min via INTRAVENOUS

## 2021-01-27 MED ORDER — PERFLUTREN LIPID MICROSPHERE
1.0000 mL | INTRAVENOUS | Status: DC | PRN
  Filled 2021-01-27: qty 10

## 2021-01-27 MED ORDER — DARBEPOETIN ALFA 200 MCG/0.4ML IJ SOSY
200.0000 ug | PREFILLED_SYRINGE | INTRAMUSCULAR | Status: DC
  Administered 2021-01-28: 200 ug via INTRAVENOUS
  Filled 2021-01-27: qty 0.4

## 2021-01-27 MED ORDER — PERFLUTREN LIPID MICROSPHERE
1.0000 mL | INTRAVENOUS | Status: AC | PRN
  Administered 2021-01-27: 2 mL via INTRAVENOUS
  Filled 2021-01-27: qty 10

## 2021-01-27 MED ORDER — SPOT INK MARKER SYRINGE KIT
PACK | SUBMUCOSAL | Status: AC
  Filled 2021-01-27: qty 5

## 2021-01-27 MED ORDER — CINACALCET HCL 30 MG PO TABS
180.0000 mg | ORAL_TABLET | ORAL | Status: DC
  Administered 2021-01-28: 180 mg via ORAL
  Filled 2021-01-27: qty 6

## 2021-01-27 MED ORDER — ONDANSETRON HCL 4 MG/2ML IJ SOLN
INTRAMUSCULAR | Status: DC | PRN
  Administered 2021-01-27: 4 mg via INTRAVENOUS

## 2021-01-27 MED ORDER — PHENYLEPHRINE 40 MCG/ML (10ML) SYRINGE FOR IV PUSH (FOR BLOOD PRESSURE SUPPORT)
PREFILLED_SYRINGE | INTRAVENOUS | Status: DC | PRN
  Administered 2021-01-27: 120 ug via INTRAVENOUS
  Administered 2021-01-27: 80 ug via INTRAVENOUS
  Administered 2021-01-27: 160 ug via INTRAVENOUS
  Administered 2021-01-27: 80 ug via INTRAVENOUS
  Administered 2021-01-27: 120 ug via INTRAVENOUS
  Administered 2021-01-27: 80 ug via INTRAVENOUS

## 2021-01-27 MED ORDER — PROSOURCE PLUS PO LIQD
30.0000 mL | Freq: Two times a day (BID) | ORAL | Status: DC
  Administered 2021-01-28 – 2021-01-29 (×2): 30 mL via ORAL
  Filled 2021-01-27 (×2): qty 30

## 2021-01-27 MED ORDER — EPHEDRINE SULFATE 50 MG/ML IJ SOLN
INTRAMUSCULAR | Status: DC | PRN
  Administered 2021-01-27: 10 mg via INTRAVENOUS

## 2021-01-27 MED ORDER — SODIUM CHLORIDE 0.9 % IV SOLN: INTRAVENOUS | Status: DC | PRN

## 2021-01-27 NOTE — Progress Notes (Addendum)
[facility] Progress Note   Subjective:  Seen in room - back from EGD this morning, followed by echo. EGD showed multiple non-bleeeding AVMs, all treated with APC. No CP or dyspnea at the moment.  Objective Vitals:   01/27/21 0740 01/27/21 0937 01/27/21 0947 01/27/21 1033  BP: 123/79 98/69 (!) 93/59 119/82  Pulse: 77 90 80 72  Resp: 14 (!) 22 20 18   Temp: 98.7 F (37.1 C) (!) 97.5 F (36.4 C)  98.1 F (36.7 C)  TempSrc: Oral Temporal  Oral  SpO2: 100% 99% 98%   Weight: 70 kg     Height: 5\' 8"  (1.727 m)      Physical Exam General: Well appearing man, NAD. Room air. Heart: RRR; no murmur Lungs: CTA anteriorly Abdomen: soft Extremities: Trace LE edema Dialysis Access: L AVF, aneurysmal with small scabbed area, + thrill  Additional Objective Labs: Basic Metabolic Panel: Recent Labs  Lab 01/25/21 1146 01/26/21 0150  NA 141 136  K 3.8 4.2  CL 95* 96*  CO2 31 28  GLUCOSE 76 89  BUN 43* 51*  CREATININE 9.22* 9.92*  CALCIUM 8.6* 8.0*  PHOS  --  8.3*   Liver Function Tests: Recent Labs  Lab 01/26/21 0150  AST 14*  ALT 11  ALKPHOS 52  BILITOT 0.8  PROT 6.2*  ALBUMIN 2.9*   CBC: Recent Labs  Lab 01/25/21 1146 01/25/21 1600 01/26/21 0150 01/26/21 1042  WBC 2.7* 3.1* 3.6* 3.5*  NEUTROABS 1.5*  --   --   --   HGB 8.6* 6.1* 7.4* 7.0*  HCT 28.3* 20.4* 23.0* 21.8*  MCV 109.3* 111.5* 105.0* 104.8*  PLT 121* 123* 116* 110*   Iron Studies:  Recent Labs    01/26/21 0150  IRON 178  TIBC 344  FERRITIN 160   Medications:   amiodarone  200 mg Oral Daily   amLODipine  2.5 mg Oral Daily   carvedilol  6.25 mg Oral BID WC   Chlorhexidine Gluconate Cloth  6 each Topical [id number]   folic acid  1 mg Oral Daily   multivitamin with minerals  1 tablet Oral Daily   pantoprazole  40 mg Oral Q0600   rosuvastatin  10 mg Oral Daily   sevelamer carbonate  3,200 mg Oral TID WC   thiamine  100 mg Oral Daily   Or   thiamine  100 mg Intravenous Daily     Dialysis Orders: [facility]  on TTS schedule 180NRe, 4 hours, BFR 450, DFR Auto 1.5, EDW 69 kg, 2K/2Ca, AVF15g No heparin, profile 4 Mircera 225 mcg IV q 2 weeks - last given as OP on 12/15/20 (then Hgb > 11 and held) Calcitriol 2.5 mcg PO q HD Sensipar 180mg  PO q HD   Assessment/Plan: Symptomatic anemia/GIB: Hx gastric/small bowel AVMs. GI consulted, s/p EGD this am showing multiple non-bleeding AVMs, all treated with APC. S/p 1U PRBCs on 7/18. Will give Aranesp with next HD and can transfuse again tomorrow if Hgb drops lower.  ESRD: Continue HD on usual TTS schedule - next tomorrow, no heparin.  Hypertension/volume: BP low/stable, no significant overload. Hold amlodipine for now.  Metabolic bone disease: Ca ok, Phos high - continue Renvela as binder, restart sensipar.  Nutrition - renal diet, adding supplements.  pAF: Previously on Eliquis, on hold  CAD/PAD  COPD  Veneta Penton, PA-C 01/27/2021, 3:26 PM  [facility]  I have seen and examined this patient and agree with plan and assessment in the above note with  renal recommendations/intervention highlighted. Enteroscopy results noted.  Multiple avm's. No heparin with HD. Governor Rooks Mariem Skolnick,MD 01/27/2021 3:43 PM

## 2021-01-27 NOTE — Anesthesia Preprocedure Evaluation (Addendum)
Anesthesia Evaluation  Patient identified by MRN, date of birth, ID band Patient awake    Reviewed: Allergy & Precautions, H&P , NPO status , Patient's Chart, lab work & pertinent test results  Airway Mallampati: II   Neck ROM: full    Dental   Pulmonary shortness of breath, COPD, Current Smoker and Patient abstained from smoking.,    breath sounds clear to auscultation       Cardiovascular hypertension, + Peripheral Vascular Disease   Rhythm:regular Rate:Normal     Neuro/Psych    GI/Hepatic (+) Hepatitis -, CGI bleed   Endo/Other    Renal/GU ESRF and DialysisRenal disease     Musculoskeletal   Abdominal   Peds  Hematology  (+) Blood dyscrasia, anemia ,   Anesthesia Other Findings   Reproductive/Obstetrics                             Anesthesia Physical Anesthesia Plan  ASA: 3  Anesthesia Plan: MAC   Post-op Pain Management:    Induction: Intravenous  PONV Risk Score and Plan: 0 and Propofol infusion and Treatment may vary due to age or medical condition  Airway Management Planned: Nasal Cannula  Additional Equipment:   Intra-op Plan:   Post-operative Plan:   Informed Consent: I have reviewed the patients History and Physical, chart, labs and discussed the procedure including the risks, benefits and alternatives for the proposed anesthesia with the patient or authorized representative who has indicated his/her understanding and acceptance.     Dental advisory given  Plan Discussed with: CRNA, Anesthesiologist and Surgeon  Anesthesia Plan Comments:         Anesthesia Quick Evaluation

## 2021-01-27 NOTE — Interval H&P Note (Signed)
History and Physical Interval Note: No interval changes. He denies passing any further blood per rectum, had HD yesterday, feeling okay today, no pain. Exam unchanged. Have discussed risks / benefits of enteroscopy and anesthesia with him and he wishes to proceed. Further recommendations pending results.   01/27/2021 8:27 AM  Raymond [name]  has presented today for surgery, with the diagnosis of anemia.  tarry stools a week ago.  hx SB AVMs.  The various methods of treatment have been discussed with the patient and family. After consideration of risks, benefits and other options for treatment, the patient has consented to  Procedure(s): ENTEROSCOPY (N/A) as a surgical intervention.  The patient's history has been reviewed, patient examined, no change in status, stable for surgery.  I have reviewed the patient's chart and labs.  Questions were answered to the patient's satisfaction.     [place]

## 2021-01-27 NOTE — Progress Notes (Addendum)
PROGRESS NOTE    Raymond [name]  Raymond [name]:[id number] DOB: 1956/06/20 DOA: 01/25/2021 PCP: [name], FNP   Brief Narrative:  Raymond [name] is a 65 y.o. male with medical history significant of ESRD, distal small bowel AVM's, CAD, COPD, HTN and multiple other medical problems presenting with dyspnea on exertion and generalized weakness.   He was recently discharged on 7/13 after being hospitalized with confusion that was thought to be related to low blood sugars.  He notes when he got home he realized he left his phone and got a ride back.  When he was walking down the hall, he was weak and tired and had to stop several times with shortness of breath.  He also notes that he felt poorly after dialysis Thursday - weak, couldn't walk far - felt like he'd been running, legs heavy and weak.  Felt better after eating candy.  Saturday he notes he was told his blood counts were low and advised to come to the hospital.  He didn't come in, but today, went to PCP office and got blood draw and was found to be anemic and they directed him to the ED via EMS.  Notes smoking, etoh, cocaine use.    Assessment & Plan:   Active Problems:   ESRD on hemodialysis (HCC)   Anemia   Symptomatic anemia  Symptomatic Anemia  Suspected Acute Blood Loss Anemia 2/2 GI Bleed  Shortness of Breath and Chest Pain with Exertion  Generalized Weakness Repeat hemoglobin 7.0 Hemoccult negative, melena reported 1 week prior to admission but resolved  Capsule endoscopy 1/21 with distal small bowel AVM's - he's not taking aspirin or plavix - not taking any anticoagulation per report PPI BID 1 unit transfused since admission GI consulted, appreciate insight and recommendations   Paroxysmal Atrial Fibrillation Currently in flutter Continue home amiodarone, coreg Not on anticoagulation given above   Pancytopenia Follow iron, b12, folate, ferritin.  Follow smear.   Chronic chronic diastolic HF not in acute  exacerbation Symptoms all likely related to anemia Repeat echo pending, Echo 2021 shows EF 60 to 65%   Tobacco Use Disorder Encouraged cessation  Cocaine Use Disorder Encouraged cessation   Alcohol Use CIWA ongoing - has not scored   ESRD HD per renal   DVT prophylaxis:  SCD, holding anticoagulation given above Code Status: full  Family Communication: None present  Status is: Inpatient  Dispo: The patient is from: Home              Anticipated d/c is to: Home              Anticipated d/c date is: 48 to 72 hours              Patient currently not medically stable for discharge  Consultants:  GI  Procedures:  To be determined  Antimicrobials:  None indicated  Subjective: No acute issues or events overnight tolerated transfusion quite well denies nausea vomiting diarrhea constipation headache fevers chills or chest pain.  Objective: Vitals:   01/26/21 1718 01/26/21 2201 01/27/21 0558 01/27/21 0740  BP: 118/73 106/65 116/80 123/79  Pulse:  76 75 77  Resp: 16 18 16 14   Temp: 98.3 F (36.8 C) 98.3 F (36.8 C) 99.3 F (37.4 C) 98.7 F (37.1 C)  TempSrc: Oral Oral Oral Oral  SpO2: 100% 97% 99% 100%  Weight:    70 kg  Height:    5\' 8"  (1.727 m)    Intake/Output Summary (Last  24 hours) at 01/27/2021 0824 Last data filed at 01/27/2021 0700 Gross per 24 hour  Intake 120 ml  Output 3100 ml  Net -2980 ml    Filed Weights   01/26/21 1212 01/26/21 1548 01/27/21 0740  Weight: 73.6 kg 70 kg 70 kg    Examination:  General:  Pleasantly resting in bed, No acute distress. HEENT:  Normocephalic atraumatic.  Sclerae nonicteric, noninjected.  Extraocular movements intact bilaterally. Neck:  Without mass or deformity.  Trachea is midline. Lungs:  Clear to auscultate bilaterally without rhonchi, wheeze, or rales. Heart:  Regular rate and rhythm.  Without murmurs, rubs, or gallops. Abdomen:  Soft, nontender, nondistended.  Without guarding or rebound. Extremities:  Without cyanosis, clubbing, edema, or obvious deformity. Vascular:  Dorsalis pedis and posterior tibial pulses palpable bilaterally. Skin:  Warm and dry, no erythema, no ulcerations.    Data Reviewed: I have personally reviewed following labs and imaging studies  CBC: Recent Labs  Lab 01/25/21 1146 01/25/21 1600 01/26/21 0150 01/26/21 1042  WBC 2.7* 3.1* 3.6* 3.5*  NEUTROABS 1.5*  --   --   --   HGB 8.6* 6.1* 7.4* 7.0*  HCT 28.3* 20.4* 23.0* 21.8*  MCV 109.3* 111.5* 105.0* 104.8*  PLT 121* 123* 116* 110*    Basic Metabolic Panel: Recent Labs  Lab 01/25/21 1146 01/26/21 0150  NA 141 136  K 3.8 4.2  CL 95* 96*  CO2 31 28  GLUCOSE 76 89  BUN 43* 51*  CREATININE 9.22* 9.92*  CALCIUM 8.6* 8.0*  MG 2.1 2.0  PHOS  --  8.3*    GFR: Estimated Creatinine Clearance: 7 mL/min (A) (by C-G formula based on SCr of 9.92 mg/dL (H)). Liver Function Tests: Recent Labs  Lab 01/26/21 0150  AST 14*  ALT 11  ALKPHOS 52  BILITOT 0.8  PROT 6.2*  ALBUMIN 2.9*    No results for input(s): LIPASE, AMYLASE in the last 168 hours. No results for input(s): AMMONIA in the last 168 hours.  Coagulation Profile: No results for input(s): INR, PROTIME in the last 168 hours. Cardiac Enzymes: No results for input(s): CKTOTAL, CKMB, CKMBINDEX, TROPONINI in the last 168 hours. BNP (last 3 results) No results for input(s): PROBNP in the last 8760 hours. HbA1C: No results for input(s): HGBA1C in the last 72 hours. CBG: Recent Labs  Lab 01/20/21 1203 01/20/21 1347 01/20/21 1531  GLUCAP 155* 126* 119*    Lipid Profile: No results for input(s): CHOL, HDL, LDLCALC, TRIG, CHOLHDL, LDLDIRECT in the last 72 hours. Thyroid Function Tests: Recent Labs    01/25/21 1651  TSH 4.029    Anemia Panel: Recent Labs    01/25/21 1651 01/26/21 0150  VITAMINB12 1,212*  --   FOLATE  --  96.0  FERRITIN  --  160  TIBC  --  344  IRON  --  178    Sepsis Labs: No results for input(s):  PROCALCITON, LATICACIDVEN in the last 168 hours.  Recent Results (from the past 240 hour(s))  SARS CORONAVIRUS 2 (TAT 6-24 HRS) Nasopharyngeal Nasopharyngeal Swab     Status: None   Collection Time: 01/20/21  2:42 AM   Specimen: Nasopharyngeal Swab  Result Value Ref Range Status   SARS Coronavirus 2 NEGATIVE NEGATIVE Final    Comment: (NOTE) SARS-CoV-2 target nucleic acids are NOT DETECTED.  The SARS-CoV-2 RNA is generally detectable in upper and lower respiratory specimens during the acute phase of infection. Negative results do not preclude SARS-CoV-2 infection, do not rule out  co-infections with other pathogens, and should not be used as the sole basis for treatment or other patient management decisions. Negative results must be combined with clinical observations, patient history, and epidemiological information. The expected result is Negative.  Fact Sheet for Patients: HairSlick.no  Fact Sheet for Healthcare Providers: quierodirigir.com  This test is not yet approved or cleared by the [place] FDA and  has been authorized for detection and/or diagnosis of SARS-CoV-2 by FDA under an Emergency Use Authorization (EUA). This EUA will remain  in effect (meaning this test can be used) for the duration of the COVID-19 declaration under Se ction 564(b)(1) of the Act, 21 U.S.C. section 360bbb-3(b)(1), unless the authorization is terminated or revoked sooner.  Performed at [facility], [address], [place], [place] [address]   Resp Panel by RT-PCR (Flu A&B, Covid) Nasopharyngeal Swab     Status: None   Collection Time: 01/25/21  4:07 PM   Specimen: Nasopharyngeal Swab; Nasopharyngeal(NP) swabs in vial transport medium  Result Value Ref Range Status   SARS Coronavirus 2 by RT PCR NEGATIVE NEGATIVE Final    Comment: (NOTE) SARS-CoV-2 target nucleic acids are NOT DETECTED.  The SARS-CoV-2 RNA is generally  detectable in upper respiratory specimens during the acute phase of infection. The lowest concentration of SARS-CoV-2 viral copies this assay can detect is 138 copies/mL. A negative result does not preclude SARS-Cov-2 infection and should not be used as the sole basis for treatment or other patient management decisions. A negative result may occur with  improper specimen collection/handling, submission of specimen other than nasopharyngeal swab, presence of viral mutation(s) within the areas targeted by this assay, and inadequate number of viral copies(<138 copies/mL). A negative result must be combined with clinical observations, patient history, and epidemiological information. The expected result is Negative.  Fact Sheet for Patients:  BloggerCourse.com  Fact Sheet for Healthcare Providers:  SeriousBroker.it  This test is no t yet approved or cleared by the [place] FDA and  has been authorized for detection and/or diagnosis of SARS-CoV-2 by FDA under an Emergency Use Authorization (EUA). This EUA will remain  in effect (meaning this test can be used) for the duration of the COVID-19 declaration under Section 564(b)(1) of the Act, 21 U.S.C.section 360bbb-3(b)(1), unless the authorization is terminated  or revoked sooner.       Influenza A by PCR NEGATIVE NEGATIVE Final   Influenza B by PCR NEGATIVE NEGATIVE Final    Comment: (NOTE) The Xpert Xpress SARS-CoV-2/FLU/RSV plus assay is intended as an aid in the diagnosis of influenza from Nasopharyngeal swab specimens and should not be used as a sole basis for treatment. Nasal washings and aspirates are unacceptable for Xpert Xpress SARS-CoV-2/FLU/RSV testing.  Fact Sheet for Patients: BloggerCourse.com  Fact Sheet for Healthcare Providers: SeriousBroker.it  This test is not yet approved or cleared by the [place] FDA  and has been authorized for detection and/or diagnosis of SARS-CoV-2 by FDA under an Emergency Use Authorization (EUA). This EUA will remain in effect (meaning this test can be used) for the duration of the COVID-19 declaration under Section 564(b)(1) of the Act, 21 U.S.C. section 360bbb-3(b)(1), unless the authorization is terminated or revoked.  Performed at [facility], [address], [place], [place] [address]           Radiology Studies: DG Chest Portable 1 View  Result Date: 01/25/2021 CLINICAL DATA:  Weakness.  On dialysis. EXAM: PORTABLE CHEST 1 VIEW COMPARISON:  01/19/2021  FINDINGS: The cardiac silhouette remains moderately enlarged, unchanged. Aortic atherosclerosis is noted. No airspace consolidation, edema, pleural effusion, pneumothorax is identified. No acute osseous abnormality is seen. IMPRESSION: No active disease. Electronically Signed   By: Sebastian Ache M.D.   On: 01/25/2021 12:54     Scheduled Meds:  [MAR Hold] amiodarone  200 mg Oral Daily   [MAR Hold] amLODipine  2.5 mg Oral Daily   [MAR Hold] carvedilol  6.25 mg Oral BID WC   [MAR Hold] Chlorhexidine Gluconate Cloth  6 each Topical Q0600   [MAR Hold] folic acid  1 mg Oral Daily   [MAR Hold] multivitamin with minerals  1 tablet Oral Daily   [MAR Hold] pantoprazole  40 mg Oral Q0600   [MAR Hold] rosuvastatin  10 mg Oral Daily   [MAR Hold] sevelamer carbonate  3,200 mg Oral TID WC   [MAR Hold] thiamine  100 mg Oral Daily   Or   [MAR Hold] thiamine  100 mg Intravenous Daily    LOS: 2 days   Time spent:  Azucena Fallen, DO [facility]  If 7PM-7AM, please contact night-coverage [email or web address]  01/27/2021, 8:24 AM

## 2021-01-27 NOTE — Progress Notes (Signed)
  Echocardiogram 2D Echocardiogram with contrast has been performed.  Raymond [name] F 01/27/2021, 1:24 PM

## 2021-01-27 NOTE — Op Note (Signed)
[facility] Patient Name: Raymond [name] Procedure Date : 01/27/2021 MRN: [id number] Attending MD: Carlota Raspberry. Havery Moros , MD Date of Birth: [birth year] CSN: [id number] Age: 65 Admit Type: Inpatient Procedure:                Small bowel enteroscopy Indications:              Melena, history of Arteriovenous malformation in                            the stomach, history of Arteriovenous malformation                            in the small intestine Providers:                Carlota Raspberry. Havery Moros, MD, Glori Bickers, RN, Cherylynn Ridges, Technician, Vickii Penna CRNA Referring MD:              Medicines:                Monitored Anesthesia Care Complications:            No immediate complications. Estimated blood loss:                            Minimal. Estimated Blood Loss:     Estimated blood loss was minimal. Procedure:                Pre-Anesthesia Assessment:                           - Prior to the procedure, a History and Physical                            was performed, and patient medications and                            allergies were reviewed. The patient's tolerance of                            previous anesthesia was also reviewed. The risks                            and benefits of the procedure and the sedation                            options and risks were discussed with the patient.                            All questions were answered, and informed consent                            was obtained. Prior Anticoagulants: The patient has  taken no previous anticoagulant or antiplatelet                            agents. ASA Grade Assessment: III - A patient with                            severe systemic disease. After reviewing the risks                            and benefits, the patient was deemed in                            satisfactory condition to undergo the procedure.                            After obtaining informed consent, the endoscope was                            passed under direct vision. Throughout the                            procedure, the patient's blood pressure, pulse, and                            oxygen saturations were monitored continuously. The                            [id number]-[id number] ([id number]) Olympus pediatric colonoscope                            was introduced through the mouth and advanced to                            the proximal jejunum. The small bowel enteroscopy                            was accomplished without difficulty. The patient                            tolerated the procedure well. Scope In: Scope Out: Findings:      The Z-line was regular.      The exam of the esophagus was otherwise normal.      Two diminutive angiodysplastic lesions with no bleeding were found in       the gastric antrum. Fulguration to ablate the lesions to prevent       bleeding by argon plasma was successful.      Two small angiodysplastic lesions with no bleeding were found in the       gastric body. Fulguration to ablate the lesions to prevent bleeding by       argon plasma was successful.      The exam of the stomach was otherwise normal.      Six small angiodysplastic lesions with no bleeding were found in the       duodenal bulb. Fulguration to ablate the lesions to prevent bleeding  by       argon plasma was successful.      The exam of the duodenum was otherwise normal.      Four angiodysplastic lesions with no bleeding were found in the proximal       jejunum / third portion of the duodenum. Fulguration to ablate the       lesions to prevent bleeding by argon plasma was successful.      Exam of the jejunum was otherwise normal. Impression:               - Z-line regular.                           - Two non-bleeding angiodysplastic lesions in the                            stomach. Treated with argon plasma coagulation                             (APC).                           - Two non-bleeding angiodysplastic lesions in the                            stomach. Treated with argon plasma coagulation                            (APC).                           - Six non-bleeding angiodysplastic lesions in the                            duodenum. Treated with argon plasma coagulation                            (APC).                           - Four non-bleeding angiodysplastic lesions in the                            distal duodenum / proximal jejunum. Treated with                            argon plasma coagulation (APC).                           AVMs are the likely cause of the patient's anemia /                            dark stools. He has not had overt bleeding for a                            few days now, has been stable. Recommendation:           -  Return patient to hospital ward for ongoing care.                           - Clear liquid diet now and advance later today as                            tolerated.                           - Continue present medications.                           - Trend Hgb, monitor for recurrent bleeding                           - We will reassess him in the AM, call with                            questions Procedure Code(s):        --- Professional ---                           [phone], Small intestinal endoscopy, enteroscopy                            beyond second portion of duodenum, not including                            ileum; with control of bleeding (eg, injection,                            bipolar cautery, unipolar cautery, laser, heater                            probe, stapler, plasma coagulator) Diagnosis Code(s):        --- Professional ---                           K31.819, Angiodysplasia of stomach and duodenum                            without bleeding                           K92.1, Melena (includes Hematochezia) CPT copyright 2019 American Medical Association. All  rights reserved. The codes documented in this report are preliminary and upon coder review may  be revised to meet current compliance requirements. Remo Lipps P. Havery Moros, MD 01/27/2021 9:25:26 AM This report has been signed electronically. Number of Addenda: 0

## 2021-01-27 NOTE — Transfer of Care (Signed)
Immediate Anesthesia Transfer of Care Note  Patient: Raymond [name]  Procedure(s) Performed: ENTEROSCOPY HOT HEMOSTASIS (ARGON PLASMA COAGULATION/BICAP)  Patient Location: PACU  Anesthesia Type:MAC  Level of Consciousness: drowsy and patient cooperative  Airway & Oxygen Therapy: Patient Spontanous Breathing  Post-op Assessment: Report given to RN and Post -op Vital signs reviewed and stable  Post vital signs: Reviewed and stable  Last Vitals:  Vitals Value Taken Time  BP 98/69 01/27/21 0937  Temp    Pulse 88 01/27/21 0938  Resp 21 01/27/21 0938  SpO2 98 % 01/27/21 0938  Vitals shown include unvalidated device data.  Last Pain:  Vitals:   01/27/21 0937  TempSrc:   PainSc: 0-No pain         Complications: No notable events documented.

## 2021-01-27 NOTE — Progress Notes (Signed)
  Echocardiogram 2D Echocardiogram with contrast has been performed.  Merrie Roof F 01/27/2021, 1:57 PM

## 2021-01-28 DIAGNOSIS — K31811 Angiodysplasia of stomach and duodenum with bleeding: Principal | ICD-10-CM

## 2021-01-28 LAB — BASIC METABOLIC PANEL
Anion gap: 9 (ref 5–15)
BUN: 35 mg/dL — ABNORMAL HIGH (ref 8–23)
CO2: 29 mmol/L (ref 22–32)
Calcium: 8.4 mg/dL — ABNORMAL LOW (ref 8.9–10.3)
Chloride: 96 mmol/L — ABNORMAL LOW (ref 98–111)
Creatinine, Ser: 8.62 mg/dL — ABNORMAL HIGH (ref 0.61–1.24)
GFR, Estimated: 6 mL/min — ABNORMAL LOW (ref >60)
Glucose, Bld: 108 mg/dL — ABNORMAL HIGH (ref 70–99)
Potassium: 4.1 mmol/L (ref 3.5–5.1)
Sodium: 134 mmol/L — ABNORMAL LOW (ref 135–145)

## 2021-01-28 LAB — HCV RNA QUANT: HCV Quantitative: NOT DETECTED IU/mL (ref >50)

## 2021-01-28 LAB — CBC
HCT: 21.4 % — ABNORMAL LOW (ref 39.0–52.0)
Hemoglobin: 6.7 g/dL — CL (ref 13.0–17.0)
MCH: 33.5 pg (ref 26.0–34.0)
MCHC: 31.3 g/dL (ref 30.0–36.0)
MCV: 107 fL — ABNORMAL HIGH (ref 80.0–100.0)
Platelets: 91 10*3/uL — ABNORMAL LOW (ref 150–400)
RBC: 2 MIL/uL — ABNORMAL LOW (ref 4.22–5.81)
RDW: 19 % — ABNORMAL HIGH (ref 11.5–15.5)
WBC: 3.4 10*3/uL — ABNORMAL LOW (ref 4.0–10.5)
nRBC: 0 % (ref 0.0–0.2)

## 2021-01-28 LAB — PREPARE RBC (CROSSMATCH)

## 2021-01-28 MED ORDER — MELATONIN 3 MG PO TABS
3.0000 mg | ORAL_TABLET | Freq: Every evening | ORAL | Status: DC | PRN
  Administered 2021-01-28 (×2): 3 mg via ORAL
  Filled 2021-01-28 (×2): qty 1

## 2021-01-28 MED ORDER — SODIUM CHLORIDE 0.9% IV SOLUTION: Freq: Once | INTRAVENOUS | Status: DC

## 2021-01-28 MED ORDER — DARBEPOETIN ALFA 200 MCG/0.4ML IJ SOSY
PREFILLED_SYRINGE | INTRAMUSCULAR | Status: AC
  Filled 2021-01-28: qty 0.4

## 2021-01-28 NOTE — Care Management Important Message (Signed)
Important Message  Patient Details  Name: Raymond [name] MRN: [id number] Date of Birth: 1955-11-09   Medicare Important Message Given:  Yes  Patient has a contact precaution in place  will mail IM to the patient home address.    Theresa Wedel 01/28/2021, 2:47 PM

## 2021-01-28 NOTE — Progress Notes (Signed)
CSW met with pt for substance use consult. Pt states he drinks about 3x/week. When he drinks, he uses a pint of wine (unclear what alcohol percentage). Pt states he uses cocaine about twice per month; states that financial barriers to using it more regularly. Pt states he has had interest in residential treatment in the past but understands that HD is a barrier to residential treatment. CSW provides pt a list of resources and explains alternative levels of care; specifically IOP. CSW circles programs that provide IOP. 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 CSW reviews list with pt to identify agencies that assist with that. Pt is somewhat familiar with Narcotics Anonymous. He is agreeable to a meeting schedule which CSW provides.

## 2021-01-28 NOTE — Progress Notes (Signed)
PROGRESS NOTE    Raymond [name]  OZH:[id number] DOB: 02-Feb-1956 DOA: 01/25/2021 PCP: [name], FNP   Brief Narrative:  Raymond [name] is a 65 y.o. male with medical history significant of ESRD, distal small bowel AVM's, CAD, COPD, HTN and multiple other medical problems presenting with dyspnea on exertion and generalized weakness.   He was recently discharged on 7/13 after being hospitalized with confusion that was thought to be related to low blood sugars.  He notes when he got home he realized he left his phone and got a ride back.  When he was walking down the hall, he was weak and tired and had to stop several times with shortness of breath.  He also notes that he felt poorly after dialysis Thursday - weak, couldn't walk far - felt like he'd been running, legs heavy and weak.  Felt better after eating candy.  Saturday he notes he was told his blood counts were low and advised to come to the hospital.  He didn't come in, but today, went to PCP office and got blood draw and was found to be anemic and they directed him to the ED via EMS.  Notes smoking, etoh, cocaine use.    Assessment & Plan:   Active Problems:   ESRD on hemodialysis (HCC)   Anemia   AVM (arteriovenous malformation) of stomach, acquired with hemorrhage   Symptomatic anemia  Symptomatic Anemia  Suspected Acute Blood Loss Anemia 2/2 GI Bleed  Shortness of Breath and Chest Pain with Exertion  Generalized Weakness Repeat hemoglobin 7.0 Hemoccult negative, melena reported 1 week prior to admission but resolved  Capsule endoscopy 1/21 with distal small bowel AVM's - he's not taking aspirin or plavix - not taking any anticoagulation per report PPI BID 2 unit transfused since admission; additional unit given overnight due to ongoing Hgb <7 GI consulted, appreciate insight and recommendations   Paroxysmal Atrial Fibrillation Currently in flutter Continue home amiodarone, coreg Not on anticoagulation given above    Pancytopenia Follow iron, b12, folate, ferritin.   Chronic chronic diastolic HF not in acute exacerbation Symptoms all likely related to anemia Repeat echo pending, Echo 2021 shows EF 60 to 65%   Tobacco Use Disorder Encouraged cessation  Cocaine Use Disorder Encouraged cessation   Alcohol Use CIWA ongoing - has not scored   ESRD HD per renal   DVT prophylaxis:  SCD, holding anticoagulation given above ongoing anemia Code Status: full  Family Communication: None present  Status is: Inpatient  Dispo: The patient is from: Home              Anticipated d/c is to: Home              Anticipated d/c date is: 48 to 72 hours              Patient currently not medically stable for discharge  Consultants:  GI  Procedures:  To be determined  Antimicrobials:  None indicated  Subjective: No acute issues or events overnight tolerated transfusion quite well denies nausea vomiting diarrhea constipation headache fevers chills or chest pain.  Objective: Vitals:   01/27/21 1033 01/27/21 2142 01/28/21 0508 01/28/21 0658  BP: 119/82 128/88 95/63 116/74  Pulse: 72 86 72 71  Resp: 18 18 18 18   Temp: 98.1 F (36.7 C) 98.4 F (36.9 C) 98.4 F (36.9 C) 98.8 F (37.1 C)  TempSrc: Oral Oral  Oral  SpO2:  97% 99% 96%  Weight:  Height:        Intake/Output Summary (Last 24 hours) at 01/28/2021 0807 Last data filed at 01/28/2021 0508 Gross per 24 hour  Intake 780 ml  Output 0 ml  Net 780 ml    Filed Weights   01/26/21 1212 01/26/21 1548 01/27/21 0740  Weight: 73.6 kg 70 kg 70 kg    Examination:  General:  Pleasantly resting in bed, No acute distress. HEENT:  Normocephalic atraumatic.  Sclerae nonicteric, noninjected.  Extraocular movements intact bilaterally. Neck:  Without mass or deformity.  Trachea is midline. Lungs:  Clear to auscultate bilaterally without rhonchi, wheeze, or rales. Heart:  Regular rate and rhythm.  Without murmurs, rubs, or gallops. Abdomen:   Soft, nontender, nondistended.  Without guarding or rebound. Extremities: Without cyanosis, clubbing, edema, or obvious deformity. Vascular:  Dorsalis pedis and posterior tibial pulses palpable bilaterally. Skin:  Warm and dry, no erythema, no ulcerations.    Data Reviewed: I have personally reviewed following labs and imaging studies  CBC: Recent Labs  Lab 01/25/21 1146 01/25/21 1600 01/26/21 0150 01/26/21 1042 01/28/21 0536  WBC 2.7* 3.1* 3.6* 3.5* 3.4*  NEUTROABS 1.5*  --   --   --   --   HGB 8.6* 6.1* 7.4* 7.0* 6.7*  HCT 28.3* 20.4* 23.0* 21.8* 21.4*  MCV 109.3* 111.5* 105.0* 104.8* 107.0*  PLT 121* 123* 116* 110* 91*    Basic Metabolic Panel: Recent Labs  Lab 01/25/21 1146 01/26/21 0150 01/28/21 0536  NA 141 136 134*  K 3.8 4.2 4.1  CL 95* 96* 96*  CO2 31 28 29   GLUCOSE 76 89 108*  BUN 43* 51* 35*  CREATININE 9.22* 9.92* 8.62*  CALCIUM 8.6* 8.0* 8.4*  MG 2.1 2.0  --   PHOS  --  8.3*  --     GFR: Estimated Creatinine Clearance: 8 mL/min (A) (by C-G formula based on SCr of 8.62 mg/dL (H)). Liver Function Tests: Recent Labs  Lab 01/26/21 0150  AST 14*  ALT 11  ALKPHOS 52  BILITOT 0.8  PROT 6.2*  ALBUMIN 2.9*    No results for input(s): LIPASE, AMYLASE in the last 168 hours. No results for input(s): AMMONIA in the last 168 hours.  Coagulation Profile: No results for input(s): INR, PROTIME in the last 168 hours. Cardiac Enzymes: No results for input(s): CKTOTAL, CKMB, CKMBINDEX, TROPONINI in the last 168 hours. BNP (last 3 results) No results for input(s): PROBNP in the last 8760 hours. HbA1C: No results for input(s): HGBA1C in the last 72 hours. CBG: No results for input(s): GLUCAP in the last 168 hours.  Lipid Profile: No results for input(s): CHOL, HDL, LDLCALC, TRIG, CHOLHDL, LDLDIRECT in the last 72 hours. Thyroid Function Tests: Recent Labs    01/25/21 1651  TSH 4.029    Anemia Panel: Recent Labs    01/25/21 1651 01/26/21 0150   VITAMINB12 1,212*  --   FOLATE  --  96.0  FERRITIN  --  160  TIBC  --  344  IRON  --  178    Sepsis Labs: No results for input(s): PROCALCITON, LATICACIDVEN in the last 168 hours.  Recent Results (from the past 240 hour(s))  SARS CORONAVIRUS 2 (TAT 6-24 HRS) Nasopharyngeal Nasopharyngeal Swab     Status: None   Collection Time: 01/20/21  2:42 AM   Specimen: Nasopharyngeal Swab  Result Value Ref Range Status   SARS Coronavirus 2 NEGATIVE NEGATIVE Final    Comment: (NOTE) SARS-CoV-2 target nucleic acids are NOT DETECTED.  The SARS-CoV-2 RNA is generally detectable in upper and lower respiratory specimens during the acute phase of infection. Negative results do not preclude SARS-CoV-2 infection, do not rule out co-infections with other pathogens, and should not be used as the sole basis for treatment or other patient management decisions. Negative results must be combined with clinical observations, patient history, and epidemiological information. The expected result is Negative.  Fact Sheet for Patients: HairSlick.no  Fact Sheet for Healthcare Providers: quierodirigir.com  This test is not yet approved or cleared by the [place] FDA and  has been authorized for detection and/or diagnosis of SARS-CoV-2 by FDA under an Emergency Use Authorization (EUA). This EUA will remain  in effect (meaning this test can be used) for the duration of the COVID-19 declaration under Se ction 564(b)(1) of the Act, 21 U.S.C. section 360bbb-3(b)(1), unless the authorization is terminated or revoked sooner.  Performed at [facility], [address] [place], [place] [address]   Resp Panel by RT-PCR (Flu A&B, Covid) Nasopharyngeal Swab     Status: None   Collection Time: 01/25/21  4:07 PM   Specimen: Nasopharyngeal Swab; Nasopharyngeal(NP) swabs in vial transport medium  Result Value Ref Range Status   SARS Coronavirus 2 by RT  PCR NEGATIVE NEGATIVE Final    Comment: (NOTE) SARS-CoV-2 target nucleic acids are NOT DETECTED.  The SARS-CoV-2 RNA is generally detectable in upper respiratory specimens during the acute phase of infection. The lowest concentration of SARS-CoV-2 viral copies this assay can detect is 138 copies/mL. A negative result does not preclude SARS-Cov-2 infection and should not be used as the sole basis for treatment or other patient management decisions. A negative result may occur with  improper specimen collection/handling, submission of specimen other than nasopharyngeal swab, presence of viral mutation(s) within the areas targeted by this assay, and inadequate number of viral copies(<138 copies/mL). A negative result must be combined with clinical observations, patient history, and epidemiological information. The expected result is Negative.  Fact Sheet for Patients:  BloggerCourse.com  Fact Sheet for Healthcare Providers:  SeriousBroker.it  This test is no t yet approved or cleared by the [place] FDA and  has been authorized for detection and/or diagnosis of SARS-CoV-2 by FDA under an Emergency Use Authorization (EUA). This EUA will remain  in effect (meaning this test can be used) for the duration of the COVID-19 declaration under Section 564(b)(1) of the Act, 21 U.S.C.section 360bbb-3(b)(1), unless the authorization is terminated  or revoked sooner.       Influenza A by PCR NEGATIVE NEGATIVE Final   Influenza B by PCR NEGATIVE NEGATIVE Final    Comment: (NOTE) The Xpert Xpress SARS-CoV-2/FLU/RSV plus assay is intended as an aid in the diagnosis of influenza from Nasopharyngeal swab specimens and should not be used as a sole basis for treatment. Nasal washings and aspirates are unacceptable for Xpert Xpress SARS-CoV-2/FLU/RSV testing.  Fact Sheet for Patients: BloggerCourse.com  Fact Sheet for  Healthcare Providers: SeriousBroker.it  This test is not yet approved or cleared by the [place] FDA and has been authorized for detection and/or diagnosis of SARS-CoV-2 by FDA under an Emergency Use Authorization (EUA). This EUA will remain in effect (meaning this test can be used) for the duration of the COVID-19 declaration under Section 564(b)(1) of the Act, 21 U.S.C. section 360bbb-3(b)(1), unless the authorization is terminated or revoked.  Performed at [facility], [address] [place], [place] [address]  Radiology Studies: ECHOCARDIOGRAM COMPLETE  Result Date: 01/27/2021    ECHOCARDIOGRAM REPORT   Patient Name:   VASILIOS BATZ Alred Date of Exam: 01/27/2021 Medical Rec #:  [id number]     Height:       68.0 in Accession #:    [id number]    Weight:       154.3 lb Date of Birth:  08/02/1955     BSA:          1.831 m Patient Age:    65 years      BP:           119/82 mmHg Patient Gender: M             HR:           71 bpm. Exam Location:  Inpatient Procedure: 2D Echo, Cardiac Doppler, Color Doppler and Intracardiac            Opacification Agent Indications:    Other abnormalities of the heart R00.8  History:        Patient has prior history of Echocardiogram examinations, most                 recent 12/15/2017. ESRD on HD. Anemia. Hypoglycemic. Hypotension.  Sonographer:    Roosvelt Maser RDCS Referring Phys: [phone] A CALDWELL POWELL JR IMPRESSIONS  1. Left ventricular ejection fraction, by estimation, is 45 to 50%. The left ventricle has mildly decreased function. The left ventricle demonstrates global hypokinesis. There is moderate left ventricular hypertrophy. Left ventricular diastolic parameters are indeterminate.  2. Right ventricular systolic function is mildly reduced. The right ventricular size is mildly enlarged. There is normal pulmonary artery systolic pressure. The estimated right ventricular systolic pressure is 34.8 mmHg.  3. Left atrial  size was severely dilated.  4. Right atrial size was moderately dilated.  5. The mitral valve is normal in structure. Trivial mitral valve regurgitation. No evidence of mitral stenosis.  6. The aortic valve is tricuspid. Aortic valve regurgitation is not visualized. Mild aortic valve sclerosis is present, with no evidence of aortic valve stenosis.  7. Aortic dilatation noted. There is borderline dilatation of the aortic root, measuring 37 mm.  8. The inferior vena cava is dilated in size with >50% respiratory variability, suggesting right atrial pressure of 8 mmHg. FINDINGS  Left Ventricle: Left ventricular ejection fraction, by estimation, is 45 to 50%. The left ventricle has mildly decreased function. The left ventricle demonstrates global hypokinesis. Definity contrast agent was given IV to delineate the left ventricular  endocardial borders. The left ventricular internal cavity size was normal in size. There is moderate left ventricular hypertrophy. Left ventricular diastolic parameters are indeterminate. Right Ventricle: The right ventricular size is mildly enlarged. No increase in right ventricular wall thickness. Right ventricular systolic function is mildly reduced. There is normal pulmonary artery systolic pressure. The tricuspid regurgitant velocity  is 2.59 m/s, and with an assumed right atrial pressure of 8 mmHg, the estimated right ventricular systolic pressure is 34.8 mmHg. Left Atrium: Left atrial size was severely dilated. Right Atrium: Right atrial size was moderately dilated. Pericardium: There is no evidence of pericardial effusion. Mitral Valve: The mitral valve is normal in structure. There is mild calcification of the mitral valve leaflet(s). Mild to moderate mitral annular calcification. Trivial mitral valve regurgitation. No evidence of mitral valve stenosis. Tricuspid Valve: The tricuspid valve is normal in structure. Tricuspid valve regurgitation is mild. Aortic Valve: The aortic valve is  tricuspid. Aortic valve regurgitation is  not visualized. Mild aortic valve sclerosis is present, with no evidence of aortic valve stenosis. Aortic valve mean gradient measures 2.0 mmHg. Aortic valve peak gradient measures 3.7 mmHg. Aortic valve area, by VTI measures 2.83 cm. Pulmonic Valve: The pulmonic valve was normal in structure. Pulmonic valve regurgitation is trivial. Aorta: Aortic dilatation noted. There is borderline dilatation of the aortic root, measuring 37 mm. Venous: The inferior vena cava is dilated in size with greater than 50% respiratory variability, suggesting right atrial pressure of 8 mmHg. IAS/Shunts: No atrial level shunt detected by color flow Doppler.  LEFT VENTRICLE PLAX 2D LVIDd:         4.95 cm LVIDs:         3.50 cm LV PW:         1.20 cm LV IVS:        1.30 cm LVOT diam:     2.20 cm  3D Volume EF: LV SV:         52       3D EF:        50 % LV SV Index:   28       LV EDV:       109 ml LVOT Area:     3.80 cm LV ESV:       54 ml                         LV SV:        54 ml RIGHT VENTRICLE            IVC RV S prime:     8.70 cm/s  IVC diam: 2.30 cm TAPSE (M-mode): 1.5 cm LEFT ATRIUM              Index       RIGHT ATRIUM           Index LA diam:        4.50 cm  2.46 cm/m  RA Area:     36.20 cm LA Vol (A2C):   72.6 ml  39.66 ml/m RA Volume:   163.00 ml 89.05 ml/m LA Vol (A4C):   103.0 ml 56.27 ml/m LA Biplane Vol: 92.4 ml  50.48 ml/m  AORTIC VALVE AV Area (Vmax):    2.97 cm AV Area (Vmean):   2.70 cm AV Area (VTI):     2.83 cm AV Vmax:           96.50 cm/s AV Vmean:          65.200 cm/s AV VTI:            0.184 m AV Peak Grad:      3.7 mmHg AV Mean Grad:      2.0 mmHg LVOT Vmax:         75.40 cm/s LVOT Vmean:        46.300 cm/s LVOT VTI:          0.137 m LVOT/AV VTI ratio: 0.74  AORTA Ao Root diam: 3.70 cm Ao Asc diam:  3.60 cm TRICUSPID VALVE TR Peak grad:   26.8 mmHg TR Vmax:        259.00 cm/s  SHUNTS Systemic VTI:  0.14 m Systemic Diam: 2.20 cm Marca Ancona MD Electronically  signed by Marca Ancona MD Signature Date/Time: 01/27/2021/5:08:12 PM    Final      Scheduled Meds:  (feeding supplement) PROSource Plus  30 mL Oral BID BM   sodium  chloride   Intravenous Once   amiodarone  200 mg Oral Daily   carvedilol  6.25 mg Oral BID WC   Chlorhexidine Gluconate Cloth  6 each Topical Q0600   cinacalcet  180 mg Oral Once per day on Tue Thu Sat   darbepoetin (ARANESP) injection - DIALYSIS  200 mcg Intravenous Q Thu-HD   folic acid  1 mg Oral Daily   multivitamin with minerals  1 tablet Oral Daily   pantoprazole  40 mg Oral Q0600   rosuvastatin  10 mg Oral Daily   sevelamer carbonate  3,200 mg Oral TID WC   thiamine  100 mg Oral Daily   Or   thiamine  100 mg Intravenous Daily    LOS: 3 days   Time spent:  Azucena Fallen, DO [facility]  If 7PM-7AM, please contact night-coverage [email or web address]  01/28/2021, 8:07 AM

## 2021-01-28 NOTE — Procedures (Signed)
I was present at this dialysis session. I have reviewed the session itself and made appropriate changes.   Vital signs in last 24 hours:  Temp:  [97.5 F (36.4 C)-98.8 F (37.1 C)] 97.5 F (36.4 C) (07/21 0842) Pulse Rate:  [71-90] 82 (07/21 0842) Resp:  [17-22] 19 (07/21 0842) BP: (93-128)/(42-88) 112/69 (07/21 0842) SpO2:  [96 %-99 %] 96 % (07/21 0658) Weight:  [72.1 kg] 72.1 kg (07/21 0832) Weight change: -3.6 kg Filed Weights   01/26/21 1548 01/27/21 0740 01/28/21 0832  Weight: 70 kg 70 kg 72.1 kg    Recent Labs  Lab 01/26/21 0150 01/28/21 0536  NA 136 134*  K 4.2 4.1  CL 96* 96*  CO2 28 29  GLUCOSE 89 108*  BUN 51* 35*  CREATININE 9.92* 8.62*  CALCIUM 8.0* 8.4*  PHOS 8.3*  --     Recent Labs  Lab 01/25/21 1146 01/25/21 1600 01/26/21 0150 01/26/21 1042 01/28/21 0536  WBC 2.7*   < > 3.6* 3.5* 3.4*  NEUTROABS 1.5*  --   --   --   --   HGB 8.6*   < > 7.4* 7.0* 6.7*  HCT 28.3*   < > 23.0* 21.8* 21.4*  MCV 109.3*   < > 105.0* 104.8* 107.0*  PLT 121*   < > 116* 110* 91*   < > = values in this interval not displayed.    Scheduled Meds:  (feeding supplement) PROSource Plus  30 mL Oral BID BM   sodium chloride   Intravenous Once   amiodarone  200 mg Oral Daily   carvedilol  6.25 mg Oral BID WC   Chlorhexidine Gluconate Cloth  6 each Topical Q0600   cinacalcet  180 mg Oral Once per day on Tue Thu Sat   darbepoetin (ARANESP) injection - DIALYSIS  200 mcg Intravenous Q Thu-HD   folic acid  1 mg Oral Daily   multivitamin with minerals  1 tablet Oral Daily   pantoprazole  40 mg Oral Q0600   rosuvastatin  10 mg Oral Daily   sevelamer carbonate  3,200 mg Oral TID WC   thiamine  100 mg Oral Daily   Or   thiamine  100 mg Intravenous Daily   Continuous Infusions: PRN Meds:.acetaminophen **OR** acetaminophen, albuterol, LORazepam **OR** LORazepam, melatonin, polyethylene glycol, sevelamer carbonate      Dialysis Orders: [facility]  on TTS schedule 180NRe, 4 hours, BFR  450, DFR Auto 1.5, EDW 69 kg, 2K/2Ca, AVF15g No heparin, profile 4 Mircera 225 mcg IV q 2 weeks - last given as OP on 12/15/20 (then Hgb > 11 and held) Calcitriol 2.5 mcg PO q HD Sensipar 180mg  PO q HD   Assessment/Plan: Symptomatic anemia/GIB: Hx gastric/small bowel AVMs. GI consulted, s/p EGD this am showing multiple non-bleeding AVMs, all treated with APC. S/p 1U PRBCs on 7/18 and again today.  Advance diet per GI.    ESRD: Continue HD on usual TTS schedule   Hypertension/volume: BP low/stable, no significant overload. Hold amlodipine for now.  Metabolic bone disease: Ca ok, Phos high - continue Renvela as binder, restart sensipar.  Nutrition - renal diet, adding supplements.  pAF: Previously on Eliquis, on hold  CAD/PAD  COPD Thrombocytopenia - no heparin with HD.  Disposition - hopeful discharge tomorrow if Hgb remains stable   Donetta Potts,  MD 01/28/2021, 9:19 AM

## 2021-01-28 NOTE — Progress Notes (Signed)
Progress Note   Subjective  Patient feeling okay this AM. He has not had any bleeding symptoms overnight. Hgb has drifted from 7.0 to 6.7 overnight, getting 1 unit RBC this AM. He tolerated the procedure well yesterday, numerous upper tract AVMs ablated. No pain, wants to eat.   Objective   Vital signs in last 24 hours: Temp:  [97.5 F (36.4 C)-98.8 F (37.1 C)] 98.8 F (37.1 C) (07/21 0658) Pulse Rate:  [71-90] 71 (07/21 0658) Resp:  [18-22] 18 (07/21 0658) BP: (93-128)/(59-88) 116/74 (07/21 0658) SpO2:  [96 %-99 %] 96 % (07/21 0658) Last BM Date: 01/25/21 General:    AA male in NAD Abdomen:  Soft, nontender and nondistended.  Neurologic:  Alert and oriented,  grossly normal neurologically. Psych:  Cooperative. Normal mood and affect.  Intake/Output from previous day: 07/20 0701 - 07/21 0700 In: 780 [P.O.:480; I.V.:300] Out: 0  Intake/Output this shift: Total I/O In: 240 [P.O.:240] Out: -   Lab Results: Recent Labs    01/26/21 0150 01/26/21 1042 01/28/21 0536  WBC 3.6* 3.5* 3.4*  HGB 7.4* 7.0* 6.7*  HCT 23.0* 21.8* 21.4*  PLT 116* 110* 91*   BMET Recent Labs    01/25/21 1146 01/26/21 0150 01/28/21 0536  NA 141 136 134*  K 3.8 4.2 4.1  CL 95* 96* 96*  CO2 31 28 29   GLUCOSE 76 89 108*  BUN 43* 51* 35*  CREATININE 9.22* 9.92* 8.62*  CALCIUM 8.6* 8.0* 8.4*   LFT Recent Labs    01/26/21 0150  PROT 6.2*  ALBUMIN 2.9*  AST 14*  ALT 11  ALKPHOS 52  BILITOT 0.8   PT/INR No results for input(s): LABPROT, INR in the last 72 hours.  Studies/Results: ECHOCARDIOGRAM COMPLETE  Result Date: 01/27/2021    ECHOCARDIOGRAM REPORT   Patient Name:   Raymond [name] Scheff Date of Exam: 01/27/2021 Medical Rec #:  [id number]     Height:       68.0 in Accession #:    [id number]    Weight:       154.3 lb Date of Birth:  1955/11/14     BSA:          1.831 m Patient Age:    65 years      BP:           119/82 mmHg Patient Gender: M             HR:           71 bpm. Exam  Location:  Inpatient Procedure: 2D Echo, Cardiac Doppler, Color Doppler and Intracardiac            Opacification Agent Indications:    Other abnormalities of the heart R00.8  History:        Patient has prior history of Echocardiogram examinations, most                 recent 12/15/2017. ESRD on HD. Anemia. Hypoglycemic. Hypotension.  Sonographer:    Merrie Roof RDCS Referring Phys: [place]  1. Left ventricular ejection fraction, by estimation, is 45 to 50%. The left ventricle has mildly decreased function. The left ventricle demonstrates global hypokinesis. There is moderate left ventricular hypertrophy. Left ventricular diastolic parameters are indeterminate.  2. Right ventricular systolic function is mildly reduced. The right ventricular size is mildly enlarged. There is normal pulmonary artery systolic pressure. The estimated right ventricular systolic pressure is 70.2 mmHg.  3.  Left atrial size was severely dilated.  4. Right atrial size was moderately dilated.  5. The mitral valve is normal in structure. Trivial mitral valve regurgitation. No evidence of mitral stenosis.  6. The aortic valve is tricuspid. Aortic valve regurgitation is not visualized. Mild aortic valve sclerosis is present, with no evidence of aortic valve stenosis.  7. Aortic dilatation noted. There is borderline dilatation of the aortic root, measuring 37 mm.  8. The inferior vena cava is dilated in size with >50% respiratory variability, suggesting right atrial pressure of 8 mmHg. FINDINGS  Left Ventricle: Left ventricular ejection fraction, by estimation, is 45 to 50%. The left ventricle has mildly decreased function. The left ventricle demonstrates global hypokinesis. Definity contrast agent was given IV to delineate the left ventricular  endocardial borders. The left ventricular internal cavity size was normal in size. There is moderate left ventricular hypertrophy. Left ventricular diastolic parameters are  indeterminate. Right Ventricle: The right ventricular size is mildly enlarged. No increase in right ventricular wall thickness. Right ventricular systolic function is mildly reduced. There is normal pulmonary artery systolic pressure. The tricuspid regurgitant velocity  is 2.59 m/s, and with an assumed right atrial pressure of 8 mmHg, the estimated right ventricular systolic pressure is 17.6 mmHg. Left Atrium: Left atrial size was severely dilated. Right Atrium: Right atrial size was moderately dilated. Pericardium: There is no evidence of pericardial effusion. Mitral Valve: The mitral valve is normal in structure. There is mild calcification of the mitral valve leaflet(s). Mild to moderate mitral annular calcification. Trivial mitral valve regurgitation. No evidence of mitral valve stenosis. Tricuspid Valve: The tricuspid valve is normal in structure. Tricuspid valve regurgitation is mild. Aortic Valve: The aortic valve is tricuspid. Aortic valve regurgitation is not visualized. Mild aortic valve sclerosis is present, with no evidence of aortic valve stenosis. Aortic valve mean gradient measures 2.0 mmHg. Aortic valve peak gradient measures 3.7 mmHg. Aortic valve area, by VTI measures 2.83 cm. Pulmonic Valve: The pulmonic valve was normal in structure. Pulmonic valve regurgitation is trivial. Aorta: Aortic dilatation noted. There is borderline dilatation of the aortic root, measuring 37 mm. Venous: The inferior vena cava is dilated in size with greater than 50% respiratory variability, suggesting right atrial pressure of 8 mmHg. IAS/Shunts: No atrial level shunt detected by color flow Doppler.  LEFT VENTRICLE PLAX 2D LVIDd:         4.95 cm LVIDs:         3.50 cm LV PW:         1.20 cm LV IVS:        1.30 cm LVOT diam:     2.20 cm  3D Volume EF: LV SV:         52       3D EF:        50 % LV SV Index:   28       LV EDV:       109 ml LVOT Area:     3.80 cm LV ESV:       54 ml                         LV SV:        54  ml RIGHT VENTRICLE            IVC RV S prime:     8.70 cm/s  IVC diam: 2.30 cm TAPSE (M-mode): 1.5 cm LEFT ATRIUM  Index       RIGHT ATRIUM           Index LA diam:        4.50 cm  2.46 cm/m  RA Area:     36.20 cm LA Vol (A2C):   72.6 ml  39.66 ml/m RA Volume:   163.00 ml 89.05 ml/m LA Vol (A4C):   103.0 ml 56.27 ml/m LA Biplane Vol: 92.4 ml  50.48 ml/m  AORTIC VALVE AV Area (Vmax):    2.97 cm AV Area (Vmean):   2.70 cm AV Area (VTI):     2.83 cm AV Vmax:           96.50 cm/s AV Vmean:          65.200 cm/s AV VTI:            0.184 m AV Peak Grad:      3.7 mmHg AV Mean Grad:      2.0 mmHg LVOT Vmax:         75.40 cm/s LVOT Vmean:        46.300 cm/s LVOT VTI:          0.137 m LVOT/AV VTI ratio: 0.74  AORTA Ao Root diam: 3.70 cm Ao Asc diam:  3.60 cm TRICUSPID VALVE TR Peak grad:   26.8 mmHg TR Vmax:        259.00 cm/s  SHUNTS Systemic VTI:  0.14 m Systemic Diam: 2.20 cm Loralie Champagne MD Electronically signed by Loralie Champagne MD Signature Date/Time: 01/27/2021/5:08:12 PM    Final        Assessment / Plan:    65 y/o male with ESRD on HD, history of gastric / small bowel AVMs, admitted with dark stools and worsening anemia. Enteroscopy performed yesterday in which multiple gastric and small bowel AVMs were ablated. Hopefully this will provide him some benefit and reduce his risk for rebleeding, although we discussed it is quite possible he has additional AVMs in mid to distal small bowel. He has had a failed capsule endoscopy in the past. He is getting 1 unit PRBC today and will observe his course after that, he has not had any bleeding symptoms overnight, could be due to lab draws / re-equilibration. We discussed options moving forward as outpatient to reduce his bleeding risk. We could consider elective enteroscopies as outpatient to ablate AVMs before he is symptoms, or consider octreotide depot injections although that does have some risk and significant cost, we can look into that. I will  see him as outpatient following his discharge and discuss how he would like to proceed. For now, await course post transfusion, can advance diet today.  Plan: - 1 unit RBC transfusion, await course, monitor for rebleeding - okay to advance diet today - hopefully he has benefit from enteroscopy yesterday as outlined above. I will see him as outpatient after his discharge to discuss options to reduce his risk for recurrent bleeding   Will reassess him in the AM, call with questions in the interim.  Jolly Mango, MD [facility]

## 2021-01-28 NOTE — Anesthesia Postprocedure Evaluation (Signed)
Anesthesia Post Note  Patient: Raymond [name]  Procedure(s) Performed: ENTEROSCOPY HOT HEMOSTASIS (ARGON PLASMA COAGULATION/BICAP)     Patient location during evaluation: Endoscopy Anesthesia Type: MAC Level of consciousness: awake and alert Pain management: pain level controlled Vital Signs Assessment: post-procedure vital signs reviewed and stable Respiratory status: spontaneous breathing, nonlabored ventilation, respiratory function stable and patient connected to nasal cannula oxygen Cardiovascular status: stable and blood pressure returned to baseline Postop Assessment: no apparent nausea or vomiting Anesthetic complications: no   No notable events documented.  Last Vitals:  Vitals:   01/28/21 1030 01/28/21 1045  BP: 105/72 (!) 97/53  Pulse: 70   Resp: 16 17  Temp:  36.7 C  SpO2:      Last Pain:  Vitals:   01/28/21 0842  TempSrc: Oral  PainSc:                  Holley S

## 2021-01-28 NOTE — Plan of Care (Signed)
  Problem: Clinical Measurements: Goal: Ability to maintain clinical measurements within normal limits will improve Outcome: Progressing   

## 2021-01-29 ENCOUNTER — Other Ambulatory Visit: Payer: Self-pay

## 2021-01-29 DIAGNOSIS — D62 Acute posthemorrhagic anemia: Secondary | ICD-10-CM

## 2021-01-29 DIAGNOSIS — D649 Anemia, unspecified: Secondary | ICD-10-CM

## 2021-01-29 LAB — BPAM RBC
Blood Product Expiration Date: 202208062359
Blood Product Expiration Date: 202208072359
ISSUE DATE / TIME: 202207181759
ISSUE DATE / TIME: 202207211028
Unit Type and Rh: 7300
Unit Type and Rh: 7300

## 2021-01-29 LAB — TYPE AND SCREEN
ABO/RH(D): B POS
Antibody Screen: NEGATIVE
Unit division: 0
Unit division: 0

## 2021-01-29 LAB — CBC
HCT: 25.1 % — ABNORMAL LOW (ref 39.0–52.0)
Hemoglobin: 8 g/dL — ABNORMAL LOW (ref 13.0–17.0)
MCH: 32.9 pg (ref 26.0–34.0)
MCHC: 31.9 g/dL (ref 30.0–36.0)
MCV: 103.3 fL — ABNORMAL HIGH (ref 80.0–100.0)
Platelets: 80 10*3/uL — ABNORMAL LOW (ref 150–400)
RBC: 2.43 MIL/uL — ABNORMAL LOW (ref 4.22–5.81)
RDW: 21.9 % — ABNORMAL HIGH (ref 11.5–15.5)
WBC: 3 10*3/uL — ABNORMAL LOW (ref 4.0–10.5)
nRBC: 0 % (ref 0.0–0.2)

## 2021-01-29 NOTE — Discharge Summary (Signed)
Physician Discharge Summary  Raymond [name] WCB:[id number] DOB: 08-May-1956 DOA: 01/25/2021  PCP: [name], FNP  Admit date: 01/25/2021 Discharge date: 01/29/2021  Admitted From: Home Disposition: Home  Recommendations for Outpatient Follow-up:  Follow up with PCP in 1-2 weeks Please obtain BMP/CBC in one week Please follow up with nephrology as scheduled  Home Health: None Equipment/Devices: None  Discharge Condition: Stable CODE STATUS: Full Diet recommendation: Renal diet  Brief/Interim Summary: [name] is a 65 y.o. male with medical history significant of ESRD, distal small bowel AVM's, CAD, COPD, HTN and multiple other medical problems presenting with dyspnea on exertion and generalized weakness. He was recently discharged on 7/13 after being hospitalized with confusion that was thought to be related to low blood sugars.  Patient noted to be anemic from baseline.  Patient admitted as above with acute symptomatic anemia likely in setting of acute GI bleed, GI consulted and following, status post enteroscopy with multiple AVMs noted and repaired.  Patient's hemoglobin otherwise remained stable, responded to transfusion previously quite well.  Patient remains off anticoagulation in the setting of acute GI bleed despite paroxysmal atrial fibrillation.  Continue to follow clinically, follow outpatient with GI as well as nephrology.  Patient otherwise stable and agreeable for discharge home.  Discharge Diagnoses:  Active Problems:   ESRD on hemodialysis (HCC)   Anemia   AVM (arteriovenous malformation) of stomach, acquired with hemorrhage   Symptomatic anemia  Acute symptomatic Anemia  Suspected Acute Blood Loss Anemia 2/2 GI Bleed  Shortness of Breath and Chest Pain with Exertion  Generalized Weakness  Paroxysmal Atrial Fibrillation   Pancytopenia   Chronic chronic diastolic HF not in acute exacerbation   Tobacco Use Disorder  Cocaine Use Disorder   Alcohol  Use   ESRD  Discharge Instructions  Discharge Instructions     Call MD for:  persistant dizziness or light-headedness   Complete by: As directed    Call MD for:  temperature >100.4   Complete by: As directed    Diet - low sodium heart healthy   Complete by: As directed    Increase activity slowly   Complete by: As directed       Allergies as of 01/29/2021   No Known Allergies      Medication List     STOP taking these medications    amLODipine 2.5 MG tablet Commonly known as: NORVASC   calcitRIOL 0.25 MCG capsule Commonly known as: ROCALTROL       TAKE these medications    acetaminophen 325 MG tablet Commonly known as: TYLENOL Take 650 mg by mouth every 6 (six) hours as needed for mild pain.   albuterol 108 (90 Base) MCG/ACT inhaler Commonly known as: VENTOLIN HFA Inhale 2 puffs into the lungs every 4 (four) hours as needed for shortness of breath or wheezing.   amiodarone 200 MG tablet Commonly known as: PACERONE Take 1 tablet (200 mg total) by mouth daily.   carvedilol 6.25 MG tablet Commonly known as: COREG Take 1 tablet (6.25 mg total) by mouth 2 (two) times daily with a meal.   cinacalcet 30 MG tablet Commonly known as: SENSIPAR Take 6 tablets (180 mg total) by mouth Every Tuesday,Thursday,and Saturday with dialysis.   folic acid 1 MG tablet Commonly known as: FOLVITE Take 1 tablet (1 mg total) by mouth daily.   multivitamin with minerals Tabs tablet Take 1 tablet by mouth daily.   nicotine 14 mg/24hr patch Commonly known as: NICODERM  CQ - dosed in mg/24 hours Place 1 patch (14 mg total) onto the skin daily.   pantoprazole 40 MG tablet Commonly known as: PROTONIX Take 40 mg by mouth daily.   prochlorperazine 5 MG tablet Commonly known as: COMPAZINE Take 1 tablet (5 mg total) by mouth every 8 (eight) hours as needed for nausea or vomiting.   QUEtiapine 100 MG tablet Commonly known as: SEROQUEL Take 100 mg by mouth at bedtime.    rosuvastatin 10 MG tablet Commonly known as: CRESTOR Take 1 tablet (10 mg total) by mouth daily.   sevelamer carbonate 800 MG tablet Commonly known as: RENVELA Take 4 tablets (3,200 mg total) by mouth 3 (three) times daily with meals. What changed:  how much to take additional instructions   thiamine 100 MG tablet Take 1 tablet (100 mg total) by mouth daily.        No Known Allergies  Consultations: Nephrology, GI   Procedures/Studies: CT Head Wo Contrast  Result Date: 01/19/2021 CLINICAL DATA:  Delirium EXAM: CT HEAD WITHOUT CONTRAST TECHNIQUE: Contiguous axial images were obtained from the base of the skull through the vertex without intravenous contrast. COMPARISON:  July 28, 2020 FINDINGS: Brain: Mild age related global parenchymal volume loss. Stable mild burden of chronic ischemic white matter disease. No evidence of acute large vascular territory infarction, hemorrhage, hydrocephalus, extra-axial collection or mass lesion/mass effect. Vascular: No hyperdense vessel. Atherosclerotic calcifications of the internal carotid and vertebral arteries at the skull base. Skull: Normal. Negative for fracture or focal lesion. Sinuses/Orbits: Visualized portions of the paranasal sinuses and mastoid air cells are predominantly clear. Orbits are grossly unremarkable. Other: None IMPRESSION: 1. No acute intracranial findings. 2. Stable mild age related global parenchymal volume loss and chronic ischemic white matter disease. Electronically Signed   By: Dahlia Bailiff MD   On: 01/19/2021 20:07   MR BRAIN WO CONTRAST  Result Date: 01/19/2021 CLINICAL DATA:  Encephalopathy EXAM: MRI HEAD WITHOUT CONTRAST TECHNIQUE: Multiplanar, multiecho pulse sequences of the brain and surrounding structures were obtained without intravenous contrast. COMPARISON:  None. FINDINGS: Brain: No acute infarct, mass effect or extra-axial collection. No acute or chronic hemorrhage. Hyperintense T2-weighted signal is  moderately widespread throughout the white matter. Generalized volume loss without a clear lobar predilection. The midline structures are normal. Vascular: Major flow voids are preserved. Skull and upper cervical spine: Normal calvarium and skull base. Visualized upper cervical spine and soft tissues are normal. Sinuses/Orbits:No paranasal sinus fluid levels or advanced mucosal thickening. No mastoid or middle ear effusion. Normal orbits. IMPRESSION: 1. No acute intracranial abnormality. 2. Generalized volume loss and findings of chronic microvascular ischemia. Electronically Signed   By: Ulyses Jarred M.D.   On: 01/19/2021 22:40   DG Chest Portable 1 View  Result Date: 01/25/2021 CLINICAL DATA:  Weakness.  On dialysis. EXAM: PORTABLE CHEST 1 VIEW COMPARISON:  01/19/2021 FINDINGS: The cardiac silhouette remains moderately enlarged, unchanged. Aortic atherosclerosis is noted. No airspace consolidation, edema, pleural effusion, pneumothorax is identified. No acute osseous abnormality is seen. IMPRESSION: No active disease. Electronically Signed   By: Logan Bores M.D.   On: 01/25/2021 12:54   DG Chest Portable 1 View  Result Date: 01/19/2021 CLINICAL DATA:  Pt states no chest complaints, but seems altered, see notes below: Pt comes from dialysis via EMS. Pt got 3.5 hrs of 4 hrs tx with 3.2 L removed. Staff stopped tx when pt became altered. BGL was 35. 25g D50 via dialysis port aces.*comment was truncated*sob EXAM: PORTABLE  CHEST 1 VIEW COMPARISON:  12/10/2018 FINDINGS: Stable large cardiac silhouette. No pulmonary edema pleural fluid. No focal consolidation no pneumothorax. IMPRESSION: Cardiomegaly.  No evidence of pulmonary edema. Electronically Signed   By: Suzy Bouchard M.D.   On: 01/19/2021 19:31   ECHOCARDIOGRAM COMPLETE  Result Date: 01/27/2021    ECHOCARDIOGRAM REPORT   Patient Name:   Raymond [name] Date of Exam: 01/27/2021 Medical Rec #:  [id number]     Height:       68.0 in Accession #:     [id number]    Weight:       154.3 lb Date of Birth:  22-Feb-1956     BSA:          1.831 m Patient Age:    65 years      BP:           119/82 mmHg Patient Gender: M             HR:           71 bpm. Exam Location:  Inpatient Procedure: 2D Echo, Cardiac Doppler, Color Doppler and Intracardiac            Opacification Agent Indications:    Other abnormalities of the heart R00.8  History:        Patient has prior history of Echocardiogram examinations, most                 recent 12/15/2017. ESRD on HD. Anemia. Hypoglycemic. Hypotension.  Sonographer:    Merrie Roof RDCS Referring Phys: [place]  1. Left ventricular ejection fraction, by estimation, is 45 to 50%. The left ventricle has mildly decreased function. The left ventricle demonstrates global hypokinesis. There is moderate left ventricular hypertrophy. Left ventricular diastolic parameters are indeterminate.  2. Right ventricular systolic function is mildly reduced. The right ventricular size is mildly enlarged. There is normal pulmonary artery systolic pressure. The estimated right ventricular systolic pressure is 70.0 mmHg.  3. Left atrial size was severely dilated.  4. Right atrial size was moderately dilated.  5. The mitral valve is normal in structure. Trivial mitral valve regurgitation. No evidence of mitral stenosis.  6. The aortic valve is tricuspid. Aortic valve regurgitation is not visualized. Mild aortic valve sclerosis is present, with no evidence of aortic valve stenosis.  7. Aortic dilatation noted. There is borderline dilatation of the aortic root, measuring 37 mm.  8. The inferior vena cava is dilated in size with >50% respiratory variability, suggesting right atrial pressure of 8 mmHg. FINDINGS  Left Ventricle: Left ventricular ejection fraction, by estimation, is 45 to 50%. The left ventricle has mildly decreased function. The left ventricle demonstrates global hypokinesis. Definity contrast agent was given IV to  delineate the left ventricular  endocardial borders. The left ventricular internal cavity size was normal in size. There is moderate left ventricular hypertrophy. Left ventricular diastolic parameters are indeterminate. Right Ventricle: The right ventricular size is mildly enlarged. No increase in right ventricular wall thickness. Right ventricular systolic function is mildly reduced. There is normal pulmonary artery systolic pressure. The tricuspid regurgitant velocity  is 2.59 m/s, and with an assumed right atrial pressure of 8 mmHg, the estimated right ventricular systolic pressure is 17.4 mmHg. Left Atrium: Left atrial size was severely dilated. Right Atrium: Right atrial size was moderately dilated. Pericardium: There is no evidence of pericardial effusion. Mitral Valve: The mitral valve is normal in structure. There is mild calcification of the mitral  valve leaflet(s). Mild to moderate mitral annular calcification. Trivial mitral valve regurgitation. No evidence of mitral valve stenosis. Tricuspid Valve: The tricuspid valve is normal in structure. Tricuspid valve regurgitation is mild. Aortic Valve: The aortic valve is tricuspid. Aortic valve regurgitation is not visualized. Mild aortic valve sclerosis is present, with no evidence of aortic valve stenosis. Aortic valve mean gradient measures 2.0 mmHg. Aortic valve peak gradient measures 3.7 mmHg. Aortic valve area, by VTI measures 2.83 cm. Pulmonic Valve: The pulmonic valve was normal in structure. Pulmonic valve regurgitation is trivial. Aorta: Aortic dilatation noted. There is borderline dilatation of the aortic root, measuring 37 mm. Venous: The inferior vena cava is dilated in size with greater than 50% respiratory variability, suggesting right atrial pressure of 8 mmHg. IAS/Shunts: No atrial level shunt detected by color flow Doppler.  LEFT VENTRICLE PLAX 2D LVIDd:         4.95 cm LVIDs:         3.50 cm LV PW:         1.20 cm LV IVS:        1.30 cm LVOT  diam:     2.20 cm  3D Volume EF: LV SV:         52       3D EF:        50 % LV SV Index:   28       LV EDV:       109 ml LVOT Area:     3.80 cm LV ESV:       54 ml                         LV SV:        54 ml RIGHT VENTRICLE            IVC RV S prime:     8.70 cm/s  IVC diam: 2.30 cm TAPSE (M-mode): 1.5 cm LEFT ATRIUM              Index       RIGHT ATRIUM           Index LA diam:        4.50 cm  2.46 cm/m  RA Area:     36.20 cm LA Vol (A2C):   72.6 ml  39.66 ml/m RA Volume:   163.00 ml 89.05 ml/m LA Vol (A4C):   103.0 ml 56.27 ml/m LA Biplane Vol: 92.4 ml  50.48 ml/m  AORTIC VALVE AV Area (Vmax):    2.97 cm AV Area (Vmean):   2.70 cm AV Area (VTI):     2.83 cm AV Vmax:           96.50 cm/s AV Vmean:          65.200 cm/s AV VTI:            0.184 m AV Peak Grad:      3.7 mmHg AV Mean Grad:      2.0 mmHg LVOT Vmax:         75.40 cm/s LVOT Vmean:        46.300 cm/s LVOT VTI:          0.137 m LVOT/AV VTI ratio: 0.74  AORTA Ao Root diam: 3.70 cm Ao Asc diam:  3.60 cm TRICUSPID VALVE TR Peak grad:   26.8 mmHg TR Vmax:        259.00 cm/s  SHUNTS Systemic VTI:  0.14  m Systemic Diam: 2.20 cm Loralie Champagne MD Electronically signed by Loralie Champagne MD Signature Date/Time: 01/27/2021/5:08:12 PM    Final      Subjective: No acute issues or events overnight denies nausea vomiting diarrhea constipation headache fevers chills chest pain shortness of breath   Discharge Exam: Vitals:   01/29/21 0501 01/29/21 0912  BP: 116/88 114/75  Pulse: 78 72  Resp: 18 18  Temp: 98.5 F (36.9 C) 98.2 F (36.8 C)  SpO2: 97% 98%   Vitals:   01/28/21 1711 01/28/21 2104 01/29/21 0501 01/29/21 0912  BP: 113/78 126/78 116/88 114/75  Pulse: 73 74 78 72  Resp: 19 18 18 18   Temp: 98.1 F (36.7 C) 98.2 F (36.8 C) 98.5 F (36.9 C) 98.2 F (36.8 C)  TempSrc: Oral Oral Oral Oral  SpO2: 99% 98% 97% 98%  Weight:  69.9 kg    Height:        General: Pt is alert, awake, not in acute distress Cardiovascular: RRR, S1/S2 +, no  rubs, no gallops Respiratory: CTA bilaterally, no wheezing, no rhonchi Abdominal: Soft, NT, ND, bowel sounds + Extremities: no edema, no cyanosis    The results of significant diagnostics from this hospitalization (including imaging, microbiology, ancillary and laboratory) are listed below for reference.     Microbiology: Recent Results (from the past 240 hour(s))  SARS CORONAVIRUS 2 (TAT 6-24 HRS) Nasopharyngeal Nasopharyngeal Swab     Status: None   Collection Time: 01/20/21  2:42 AM   Specimen: Nasopharyngeal Swab  Result Value Ref Range Status   SARS Coronavirus 2 NEGATIVE NEGATIVE Final    Comment: (NOTE) SARS-CoV-2 target nucleic acids are NOT DETECTED.  The SARS-CoV-2 RNA is generally detectable in upper and lower respiratory specimens during the acute phase of infection. Negative results do not preclude SARS-CoV-2 infection, do not rule out co-infections with other pathogens, and should not be used as the sole basis for treatment or other patient management decisions. Negative results must be combined with clinical observations, patient history, and epidemiological information. The expected result is Negative.  Fact Sheet for Patients: SugarRoll.be  Fact Sheet for Healthcare Providers: https://www.woods-mathews.com/  This test is not yet approved or cleared by the [place] FDA and  has been authorized for detection and/or diagnosis of SARS-CoV-2 by FDA under an Emergency Use Authorization (EUA). This EUA will remain  in effect (meaning this test can be used) for the duration of the COVID-19 declaration under Se ction 564(b)(1) of the Act, 21 U.S.C. section 360bbb-3(b)(1), unless the authorization is terminated or revoked sooner.  Performed at [facility], [place] [address], [place], [place] [address]   Resp Panel by RT-PCR (Flu A&B, Covid) Nasopharyngeal Swab     Status: None   Collection Time: 01/25/21  4:07 PM    Specimen: Nasopharyngeal Swab; Nasopharyngeal(NP) swabs in vial transport medium  Result Value Ref Range Status   SARS Coronavirus 2 by RT PCR NEGATIVE NEGATIVE Final    Comment: (NOTE) SARS-CoV-2 target nucleic acids are NOT DETECTED.  The SARS-CoV-2 RNA is generally detectable in upper respiratory specimens during the acute phase of infection. The lowest concentration of SARS-CoV-2 viral copies this assay can detect is 138 copies/mL. A negative result does not preclude SARS-Cov-2 infection and should not be used as the sole basis for treatment or other patient management decisions. A negative result may occur with  improper specimen collection/handling, submission of specimen other than nasopharyngeal swab, presence of viral mutation(s) within the areas targeted  by this assay, and inadequate number of viral copies(<138 copies/mL). A negative result must be combined with clinical observations, patient history, and epidemiological information. The expected result is Negative.  Fact Sheet for Patients:  EntrepreneurPulse.com.au  Fact Sheet for Healthcare Providers:  IncredibleEmployment.be  This test is no t yet approved or cleared by the [place] FDA and  has been authorized for detection and/or diagnosis of SARS-CoV-2 by FDA under an Emergency Use Authorization (EUA). This EUA will remain  in effect (meaning this test can be used) for the duration of the COVID-19 declaration under Section 564(b)(1) of the Act, 21 U.S.C.section 360bbb-3(b)(1), unless the authorization is terminated  or revoked sooner.       Influenza A by PCR NEGATIVE NEGATIVE Final   Influenza B by PCR NEGATIVE NEGATIVE Final    Comment: (NOTE) The Xpert Xpress SARS-CoV-2/FLU/RSV plus assay is intended as an aid in the diagnosis of influenza from Nasopharyngeal swab specimens and should not be used as a sole basis for treatment. Nasal washings and aspirates are  unacceptable for Xpert Xpress SARS-CoV-2/FLU/RSV testing.  Fact Sheet for Patients: EntrepreneurPulse.com.au  Fact Sheet for Healthcare Providers: IncredibleEmployment.be  This test is not yet approved or cleared by the [place] FDA and has been authorized for detection and/or diagnosis of SARS-CoV-2 by FDA under an Emergency Use Authorization (EUA). This EUA will remain in effect (meaning this test can be used) for the duration of the COVID-19 declaration under Section 564(b)(1) of the Act, 21 U.S.C. section 360bbb-3(b)(1), unless the authorization is terminated or revoked.  Performed at [facility], [place] [address] [place], [place] [address]      Labs: BNP (last 3 results) No results for input(s): BNP in the last 8760 hours. Basic Metabolic Panel: Recent Labs  Lab 01/25/21 1146 01/26/21 0150 01/28/21 0536  NA 141 136 134*  K 3.8 4.2 4.1  CL 95* 96* 96*  CO2 31 28 29   GLUCOSE 76 89 108*  BUN 43* 51* 35*  CREATININE 9.22* 9.92* 8.62*  CALCIUM 8.6* 8.0* 8.4*  MG 2.1 2.0  --   PHOS  --  8.3*  --    Liver Function Tests: Recent Labs  Lab 01/26/21 0150  AST 14*  ALT 11  ALKPHOS 52  BILITOT 0.8  PROT 6.2*  ALBUMIN 2.9*   No results for input(s): LIPASE, AMYLASE in the last 168 hours. No results for input(s): AMMONIA in the last 168 hours. CBC: Recent Labs  Lab 01/25/21 1146 01/25/21 1600 01/26/21 0150 01/26/21 1042 01/28/21 0536 01/29/21 0221  WBC 2.7* 3.1* 3.6* 3.5* 3.4* 3.0*  NEUTROABS 1.5*  --   --   --   --   --   HGB 8.6* 6.1* 7.4* 7.0* 6.7* 8.0*  HCT 28.3* 20.4* 23.0* 21.8* 21.4* 25.1*  MCV 109.3* 111.5* 105.0* 104.8* 107.0* 103.3*  PLT 121* 123* 116* 110* 91* 80*   Cardiac Enzymes: No results for input(s): CKTOTAL, CKMB, CKMBINDEX, TROPONINI in the last 168 hours. BNP: Invalid input(s): POCBNP CBG: No results for input(s): GLUCAP in the last 168 hours. D-Dimer No results for input(s): DDIMER  in the last 72 hours. Hgb A1c No results for input(s): HGBA1C in the last 72 hours. Lipid Profile No results for input(s): CHOL, HDL, LDLCALC, TRIG, CHOLHDL, LDLDIRECT in the last 72 hours. Thyroid function studies No results for input(s): TSH, T4TOTAL, T3FREE, THYROIDAB in the last 72 hours.  Invalid input(s): FREET3 Anemia work up No results for input(s): VITAMINB12, FOLATE, FERRITIN,  TIBC, IRON, RETICCTPCT in the last 72 hours. Urinalysis    Component Value Date/Time   COLORURINE YELLOW 05/08/2010 1335   APPEARANCEUR CLEAR 05/08/2010 1335   LABSPEC 1.009 05/08/2010 1335   PHURINE 6.0 05/08/2010 1335   GLUCOSEU NEGATIVE 05/08/2010 1335   HGBUR NEGATIVE 05/08/2010 1335   BILIRUBINUR NEGATIVE 05/08/2010 1335   KETONESUR NEGATIVE 05/08/2010 1335   PROTEINUR 100 (A) 05/08/2010 1335   UROBILINOGEN 0.2 05/08/2010 1335   NITRITE NEGATIVE 05/08/2010 1335   LEUKOCYTESUR SMALL (A) 05/08/2010 1335   Sepsis Labs Invalid input(s): PROCALCITONIN,  WBC,  LACTICIDVEN Microbiology Recent Results (from the past 240 hour(s))  SARS CORONAVIRUS 2 (TAT 6-24 HRS) Nasopharyngeal Nasopharyngeal Swab     Status: None   Collection Time: 01/20/21  2:42 AM   Specimen: Nasopharyngeal Swab  Result Value Ref Range Status   SARS Coronavirus 2 NEGATIVE NEGATIVE Final    Comment: (NOTE) SARS-CoV-2 target nucleic acids are NOT DETECTED.  The SARS-CoV-2 RNA is generally detectable in upper and lower respiratory specimens during the acute phase of infection. Negative results do not preclude SARS-CoV-2 infection, do not rule out co-infections with other pathogens, and should not be used as the sole basis for treatment or other patient management decisions. Negative results must be combined with clinical observations, patient history, and epidemiological information. The expected result is Negative.  Fact Sheet for Patients: SugarRoll.be  Fact Sheet for Healthcare  Providers: https://www.woods-mathews.com/  This test is not yet approved or cleared by the [place] FDA and  has been authorized for detection and/or diagnosis of SARS-CoV-2 by FDA under an Emergency Use Authorization (EUA). This EUA will remain  in effect (meaning this test can be used) for the duration of the COVID-19 declaration under Se ction 564(b)(1) of the Act, 21 U.S.C. section 360bbb-3(b)(1), unless the authorization is terminated or revoked sooner.  Performed at [facility], [place] [address], [place], [place] [address]   Resp Panel by RT-PCR (Flu A&B, Covid) Nasopharyngeal Swab     Status: None   Collection Time: 01/25/21  4:07 PM   Specimen: Nasopharyngeal Swab; Nasopharyngeal(NP) swabs in vial transport medium  Result Value Ref Range Status   SARS Coronavirus 2 by RT PCR NEGATIVE NEGATIVE Final    Comment: (NOTE) SARS-CoV-2 target nucleic acids are NOT DETECTED.  The SARS-CoV-2 RNA is generally detectable in upper respiratory specimens during the acute phase of infection. The lowest concentration of SARS-CoV-2 viral copies this assay can detect is 138 copies/mL. A negative result does not preclude SARS-Cov-2 infection and should not be used as the sole basis for treatment or other patient management decisions. A negative result may occur with  improper specimen collection/handling, submission of specimen other than nasopharyngeal swab, presence of viral mutation(s) within the areas targeted by this assay, and inadequate number of viral copies(<138 copies/mL). A negative result must be combined with clinical observations, patient history, and epidemiological information. The expected result is Negative.  Fact Sheet for Patients:  EntrepreneurPulse.com.au  Fact Sheet for Healthcare Providers:  IncredibleEmployment.be  This test is no t yet approved or cleared by the [place] FDA and  has been authorized  for detection and/or diagnosis of SARS-CoV-2 by FDA under an Emergency Use Authorization (EUA). This EUA will remain  in effect (meaning this test can be used) for the duration of the COVID-19 declaration under Section 564(b)(1) of the Act, 21 U.S.C.section 360bbb-3(b)(1), unless the authorization is terminated  or revoked sooner.       Influenza  A by PCR NEGATIVE NEGATIVE Final   Influenza B by PCR NEGATIVE NEGATIVE Final    Comment: (NOTE) The Xpert Xpress SARS-CoV-2/FLU/RSV plus assay is intended as an aid in the diagnosis of influenza from Nasopharyngeal swab specimens and should not be used as a sole basis for treatment. Nasal washings and aspirates are unacceptable for Xpert Xpress SARS-CoV-2/FLU/RSV testing.  Fact Sheet for Patients: EntrepreneurPulse.com.au  Fact Sheet for Healthcare Providers: IncredibleEmployment.be  This test is not yet approved or cleared by the [place] FDA and has been authorized for detection and/or diagnosis of SARS-CoV-2 by FDA under an Emergency Use Authorization (EUA). This EUA will remain in effect (meaning this test can be used) for the duration of the COVID-19 declaration under Section 564(b)(1) of the Act, 21 U.S.C. section 360bbb-3(b)(1), unless the authorization is terminated or revoked.  Performed at [facility], [place] [address], [place], [place] [address]      Time coordinating discharge: Over 30 minutes  SIGNED:   Little Ishikawa, DO [facility] 01/29/2021, 4:42 PM Pager   If 7PM-7AM, please contact night-coverage [email or web address]

## 2021-01-29 NOTE — Progress Notes (Addendum)
[facility] Progress Note   Subjective:   Seen in room. No CP or dyspnea. No further bleeding. Tells me plan is for discharge today.  Objective Vitals:   01/28/21 1711 01/28/21 2104 01/29/21 0501 01/29/21 0912  BP: 113/78 126/78 116/88 114/75  Pulse: 73 74 78 72  Resp: 19 18 18 18   Temp: 98.1 F (36.7 C) 98.2 F (36.8 C) 98.5 F (36.9 C) 98.2 F (36.8 C)  TempSrc: Oral Oral Oral Oral  SpO2: 99% 98% 97% 98%  Weight:  69.9 kg    Height:       Physical Exam General: Well appearing man, NAD. Room air. 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  Additional Objective Labs: Basic Metabolic Panel: Recent Labs  Lab 01/25/21 1146 01/26/21 0150 01/28/21 0536  NA 141 136 134*  K 3.8 4.2 4.1  CL 95* 96* 96*  CO2 31 28 29   GLUCOSE 76 89 108*  BUN 43* 51* 35*  CREATININE 9.22* 9.92* 8.62*  CALCIUM 8.6* 8.0* 8.4*  PHOS  --  8.3*  --    Liver Function Tests: Recent Labs  Lab 01/26/21 0150  AST 14*  ALT 11  ALKPHOS 52  BILITOT 0.8  PROT 6.2*  ALBUMIN 2.9*   CBC: Recent Labs  Lab 01/25/21 1146 01/25/21 1600 01/26/21 0150 01/26/21 1042 01/28/21 0536 01/29/21 0221  WBC 2.7* 3.1* 3.6* 3.5* 3.4* 3.0*  NEUTROABS 1.5*  --   --   --   --   --   HGB 8.6* 6.1* 7.4* 7.0* 6.7* 8.0*  HCT 28.3* 20.4* 23.0* 21.8* 21.4* 25.1*  MCV 109.3* 111.5* 105.0* 104.8* 107.0* 103.3*  PLT 121* 123* 116* 110* 91* 80*   Studies/Results: ECHOCARDIOGRAM COMPLETE  Result Date: 01/27/2021    ECHOCARDIOGRAM REPORT   Patient Name:   DAXEN SCOLLON Arras Date of Exam: 01/27/2021 Medical Rec #:  [id number]     Height:       68.0 in Accession #:    [id number]    Weight:       154.3 lb Date of Birth:  January 13, 1956     BSA:          1.831 m Patient Age:    65 years      BP:           119/82 mmHg Patient Gender: M             HR:           71 bpm. Exam Location:  Inpatient Procedure: 2D Echo, Cardiac  Doppler, Color Doppler and Intracardiac            Opacification Agent Indications:    Other abnormalities of the heart R00.8  History:        Patient has prior history of Echocardiogram examinations, most                 recent 12/15/2017. ESRD on HD. Anemia. Hypoglycemic. Hypotension.  Sonographer:    Roosvelt Maser RDCS Referring Phys: [phone] A CALDWELL POWELL JR IMPRESSIONS  1. Left ventricular ejection fraction, by estimation, is 45 to 50%. The left ventricle has mildly decreased function. The left ventricle demonstrates global hypokinesis. There is moderate left ventricular hypertrophy. Left ventricular diastolic parameters are indeterminate.  2. Right ventricular systolic function is mildly reduced. The right ventricular size is mildly enlarged. There is normal pulmonary artery systolic pressure. The estimated right ventricular  systolic pressure is 34.8 mmHg.  3. Left atrial size was severely dilated.  4. Right atrial size was moderately dilated.  5. The mitral valve is normal in structure. Trivial mitral valve regurgitation. No evidence of mitral stenosis.  6. The aortic valve is tricuspid. Aortic valve regurgitation is not visualized. Mild aortic valve sclerosis is present, with no evidence of aortic valve stenosis.  7. Aortic dilatation noted. There is borderline dilatation of the aortic root, measuring 37 mm.  8. The inferior vena cava is dilated in size with >50% respiratory variability, suggesting right atrial pressure of 8 mmHg. FINDINGS  Left Ventricle: Left ventricular ejection fraction, by estimation, is 45 to 50%. The left ventricle has mildly decreased function. The left ventricle demonstrates global hypokinesis. Definity contrast agent was given IV to delineate the left ventricular  endocardial borders. The left ventricular internal cavity size was normal in size. There is moderate left ventricular hypertrophy. Left ventricular diastolic parameters are indeterminate. Right Ventricle: The right  ventricular size is mildly enlarged. No increase in right ventricular wall thickness. Right ventricular systolic function is mildly reduced. There is normal pulmonary artery systolic pressure. The tricuspid regurgitant velocity  is 2.59 m/s, and with an assumed right atrial pressure of 8 mmHg, the estimated right ventricular systolic pressure is 34.8 mmHg. Left Atrium: Left atrial size was severely dilated. Right Atrium: Right atrial size was moderately dilated. Pericardium: There is no evidence of pericardial effusion. Mitral Valve: The mitral valve is normal in structure. There is mild calcification of the mitral valve leaflet(s). Mild to moderate mitral annular calcification. Trivial mitral valve regurgitation. No evidence of mitral valve stenosis. Tricuspid Valve: The tricuspid valve is normal in structure. Tricuspid valve regurgitation is mild. Aortic Valve: The aortic valve is tricuspid. Aortic valve regurgitation is not visualized. Mild aortic valve sclerosis is present, with no evidence of aortic valve stenosis. Aortic valve mean gradient measures 2.0 mmHg. Aortic valve peak gradient measures 3.7 mmHg. Aortic valve area, by VTI measures 2.83 cm. Pulmonic Valve: The pulmonic valve was normal in structure. Pulmonic valve regurgitation is trivial. Aorta: Aortic dilatation noted. There is borderline dilatation of the aortic root, measuring 37 mm. Venous: The inferior vena cava is dilated in size with greater than 50% respiratory variability, suggesting right atrial pressure of 8 mmHg. IAS/Shunts: No atrial level shunt detected by color flow Doppler.  LEFT VENTRICLE PLAX 2D LVIDd:         4.95 cm LVIDs:         3.50 cm LV PW:         1.20 cm LV IVS:        1.30 cm LVOT diam:     2.20 cm  3D Volume EF: LV SV:         52       3D EF:        50 % LV SV Index:   28       LV EDV:       109 ml LVOT Area:     3.80 cm LV ESV:       54 ml                         LV SV:        54 ml RIGHT VENTRICLE            IVC RV S  prime:     8.70 cm/s  IVC diam: 2.30 cm TAPSE (M-mode):  1.5 cm LEFT ATRIUM              Index       RIGHT ATRIUM           Index LA diam:        4.50 cm  2.46 cm/m  RA Area:     36.20 cm LA Vol (A2C):   72.6 ml  39.66 ml/m RA Volume:   163.00 ml 89.05 ml/m LA Vol (A4C):   103.0 ml 56.27 ml/m LA Biplane Vol: 92.4 ml  50.48 ml/m  AORTIC VALVE AV Area (Vmax):    2.97 cm AV Area (Vmean):   2.70 cm AV Area (VTI):     2.83 cm AV Vmax:           96.50 cm/s AV Vmean:          65.200 cm/s AV VTI:            0.184 m AV Peak Grad:      3.7 mmHg AV Mean Grad:      2.0 mmHg LVOT Vmax:         75.40 cm/s LVOT Vmean:        46.300 cm/s LVOT VTI:          0.137 m LVOT/AV VTI ratio: 0.74  AORTA Ao Root diam: 3.70 cm Ao Asc diam:  3.60 cm TRICUSPID VALVE TR Peak grad:   26.8 mmHg TR Vmax:        259.00 cm/s  SHUNTS Systemic VTI:  0.14 m Systemic Diam: 2.20 cm Marca Ancona MD Electronically signed by Marca Ancona MD Signature Date/Time: 01/27/2021/5:08:12 PM    Final     Medications:   (feeding supplement) PROSource Plus  30 mL Oral BID BM   sodium chloride   Intravenous Once   amiodarone  200 mg Oral Daily   carvedilol  6.25 mg Oral BID WC   Chlorhexidine Gluconate Cloth  6 each Topical Q0600   cinacalcet  180 mg Oral Once per day on Tue Thu Sat   darbepoetin (ARANESP) injection - DIALYSIS  200 mcg Intravenous Q Thu-HD   folic acid  1 mg Oral Daily   multivitamin with minerals  1 tablet Oral Daily   pantoprazole  40 mg Oral Q0600   rosuvastatin  10 mg Oral Daily   sevelamer carbonate  3,200 mg Oral TID WC   thiamine  100 mg Oral Daily   Or   thiamine  100 mg Intravenous Daily    Dialysis Orders: [facility]  on TTS schedule 180NRe, 4 hours, BFR 450, DFR Auto 1.5, EDW 69 kg, 2K/2Ca, AVF15g No heparin, profile 4 Mircera 225 mcg IV q 2 weeks - last given as OP on 12/15/20 (then Hgb > 11 and held) Calcitriol 2.5 mcg PO q HD Sensipar 180mg  PO q HD   Assessment/Plan: Symptomatic anemia/GIB: Hx gastric/small  bowel AVMs. GI consulted, s/p EGD this am showing multiple non-bleeding AVMs, all treated with APC. S/p 1U PRBCs on 7/18 and again today.  Advance diet per GI.    ESRD: Continue HD on usual TTS schedule - next tomorrow.  Hypertension/volume: BP low/stable, no significant overload. Amlodipine on hold.  Metabolic bone disease: Ca ok, Phos high - continue Renvela as binder, restarted sensipar.  Nutrition - renal diet, adding supplements.  pAF: Previously on Eliquis, on hold  CAD/PAD  COPD Thrombocytopenia - no heparin with HD. Disposition: Likely today, ok from renal standpoint.    Ozzie Hoyle, PA-C 01/29/2021, 10:23  AM  [facility]  I have seen and examined this patient and agree with plan and assessment in the above note with renal recommendations/intervention highlighted. STable for discharge to home and f/u at outpatient HD tomorrow.  Will arrange for outpatient fistulogram due to ulcerations of avf.  Jomarie Longs A Tory Mckissack,MD 01/29/2021 2:12 PM

## 2021-01-29 NOTE — Progress Notes (Addendum)
Progress Note   Subjective  Patient doing well. Ate full breakfast. Passing brown stool now. Responded appropriately to transfusion, Hgb now up to 8.   Objective   Vital signs in last 24 hours: Temp:  [97.6 F (36.4 C)-98.5 F (36.9 C)] 98.5 F (36.9 C) (07/22 0501) Pulse Rate:  [70-82] 78 (07/22 0501) Resp:  [13-20] 18 (07/22 0501) BP: (94-126)/(49-88) 116/88 (07/22 0501) SpO2:  [97 %-99 %] 97 % (07/22 0501) Weight:  [69.9 kg] 69.9 kg (07/21 2104) Last BM Date: 01/28/21 General:    AA male in NAD Neurologic:  Alert and oriented,  grossly normal neurologically. Psych:  Cooperative. Normal mood and affect.  Intake/Output from previous day: 07/21 0701 - 07/22 0700 In: 1220 [P.O.:920; Blood:300] Out: 2400  Intake/Output this shift: No intake/output data recorded.  Lab Results: Recent Labs    01/26/21 1042 01/28/21 0536 01/29/21 0221  WBC 3.5* 3.4* 3.0*  HGB 7.0* 6.7* 8.0*  HCT 21.8* 21.4* 25.1*  PLT 110* 91* 80*   BMET Recent Labs    01/28/21 0536  NA 134*  K 4.1  CL 96*  CO2 29  GLUCOSE 108*  BUN 35*  CREATININE 8.62*  CALCIUM 8.4*   LFT No results for input(s): PROT, ALBUMIN, AST, ALT, ALKPHOS, BILITOT, BILIDIR, IBILI in the last 72 hours. PT/INR No results for input(s): LABPROT, INR in the last 72 hours.  Studies/Results: ECHOCARDIOGRAM COMPLETE  Result Date: 01/27/2021    ECHOCARDIOGRAM REPORT   Patient Name:   Raymond [name] Rotan Date of Exam: 01/27/2021 Medical Rec #:  [id number]     Height:       68.0 in Accession #:    [id number]    Weight:       154.3 lb Date of Birth:  February 17, 1956     BSA:          1.831 m Patient Age:    65 years      BP:           119/82 mmHg Patient Gender: M             HR:           71 bpm. Exam Location:  Inpatient Procedure: 2D Echo, Cardiac Doppler, Color Doppler and Intracardiac            Opacification Agent Indications:    Other abnormalities of the heart R00.8  History:        Patient has prior history of Echocardiogram  examinations, most                 recent 12/15/2017. ESRD on HD. Anemia. Hypoglycemic. Hypotension.  Sonographer:    Merrie Roof RDCS Referring Phys: Carter  1. Left ventricular ejection fraction, by estimation, is 45 to 50%. The left ventricle has mildly decreased function. The left ventricle demonstrates global hypokinesis. There is moderate left ventricular hypertrophy. Left ventricular diastolic parameters are indeterminate.  2. Right ventricular systolic function is mildly reduced. The right ventricular size is mildly enlarged. There is normal pulmonary artery systolic pressure. The estimated right ventricular systolic pressure is 96.2 mmHg.  3. Left atrial size was severely dilated.  4. Right atrial size was moderately dilated.  5. The mitral valve is normal in structure. Trivial mitral valve regurgitation. No evidence of mitral stenosis.  6. The aortic valve is tricuspid. Aortic valve regurgitation is not visualized. Mild aortic valve sclerosis is present, with no evidence of aortic valve  stenosis.  7. Aortic dilatation noted. There is borderline dilatation of the aortic root, measuring 37 mm.  8. The inferior vena cava is dilated in size with >50% respiratory variability, suggesting right atrial pressure of 8 mmHg. FINDINGS  Left Ventricle: Left ventricular ejection fraction, by estimation, is 45 to 50%. The left ventricle has mildly decreased function. The left ventricle demonstrates global hypokinesis. Definity contrast agent was given IV to delineate the left ventricular  endocardial borders. The left ventricular internal cavity size was normal in size. There is moderate left ventricular hypertrophy. Left ventricular diastolic parameters are indeterminate. Right Ventricle: The right ventricular size is mildly enlarged. No increase in right ventricular wall thickness. Right ventricular systolic function is mildly reduced. There is normal pulmonary artery systolic pressure.  The tricuspid regurgitant velocity  is 2.59 m/s, and with an assumed right atrial pressure of 8 mmHg, the estimated right ventricular systolic pressure is 97.6 mmHg. Left Atrium: Left atrial size was severely dilated. Right Atrium: Right atrial size was moderately dilated. Pericardium: There is no evidence of pericardial effusion. Mitral Valve: The mitral valve is normal in structure. There is mild calcification of the mitral valve leaflet(s). Mild to moderate mitral annular calcification. Trivial mitral valve regurgitation. No evidence of mitral valve stenosis. Tricuspid Valve: The tricuspid valve is normal in structure. Tricuspid valve regurgitation is mild. Aortic Valve: The aortic valve is tricuspid. Aortic valve regurgitation is not visualized. Mild aortic valve sclerosis is present, with no evidence of aortic valve stenosis. Aortic valve mean gradient measures 2.0 mmHg. Aortic valve peak gradient measures 3.7 mmHg. Aortic valve area, by VTI measures 2.83 cm. Pulmonic Valve: The pulmonic valve was normal in structure. Pulmonic valve regurgitation is trivial. Aorta: Aortic dilatation noted. There is borderline dilatation of the aortic root, measuring 37 mm. Venous: The inferior vena cava is dilated in size with greater than 50% respiratory variability, suggesting right atrial pressure of 8 mmHg. IAS/Shunts: No atrial level shunt detected by color flow Doppler.  LEFT VENTRICLE PLAX 2D LVIDd:         4.95 cm LVIDs:         3.50 cm LV PW:         1.20 cm LV IVS:        1.30 cm LVOT diam:     2.20 cm  3D Volume EF: LV SV:         52       3D EF:        50 % LV SV Index:   28       LV EDV:       109 ml LVOT Area:     3.80 cm LV ESV:       54 ml                         LV SV:        54 ml RIGHT VENTRICLE            IVC RV S prime:     8.70 cm/s  IVC diam: 2.30 cm TAPSE (M-mode): 1.5 cm LEFT ATRIUM              Index       RIGHT ATRIUM           Index LA diam:        4.50 cm  2.46 cm/m  RA Area:     36.20 cm LA Vol  (A2C):  72.6 ml  39.66 ml/m RA Volume:   163.00 ml 89.05 ml/m LA Vol (A4C):   103.0 ml 56.27 ml/m LA Biplane Vol: 92.4 ml  50.48 ml/m  AORTIC VALVE AV Area (Vmax):    2.97 cm AV Area (Vmean):   2.70 cm AV Area (VTI):     2.83 cm AV Vmax:           96.50 cm/s AV Vmean:          65.200 cm/s AV VTI:            0.184 m AV Peak Grad:      3.7 mmHg AV Mean Grad:      2.0 mmHg LVOT Vmax:         75.40 cm/s LVOT Vmean:        46.300 cm/s LVOT VTI:          0.137 m LVOT/AV VTI ratio: 0.74  AORTA Ao Root diam: 3.70 cm Ao Asc diam:  3.60 cm TRICUSPID VALVE TR Peak grad:   26.8 mmHg TR Vmax:        259.00 cm/s  SHUNTS Systemic VTI:  0.14 m Systemic Diam: 2.20 cm Loralie Champagne MD Electronically signed by Loralie Champagne MD Signature Date/Time: 01/27/2021/5:08:12 PM    Final        Assessment / Plan:    65 y/o male with ESRD on HD, history of gastric / small bowel AVMs, admitted with dark stools and worsening anemia. Enteroscopy 2 days ago in which multiple gastric and small bowel AVMs were ablated.  His Hgb drifted yesterday but he responded appropriately to RBC transfusion and passing brown stool, his bleeding has stopped. Hopefully this will provide him some benefit and reduce his risk for rebleeding, although we discussed it is quite possible he has additional AVMs in mid to distal small bowel or that he will get recurrent proximal small bowels again, he is on HD which is a risk factor for this. We discuss options of elective enteroscopies as outpatient to ablate AVMs before he becomes symptomatic from anemia and also consider octreotide depot injections as outpatient if he can afford it. I will follow him up in clinic and look into octreotide in the interim.   Plan: - okay for discharge today from bleeding perspective - repeat CBC in one week - hopefully he has benefit from enteroscopy yesterday as outlined above. I will see him as outpatient after his discharge to discuss options to reduce his risk for  recurrent bleeding, our office to coordinate and look into octreotide depot in the interim   Will sign off for now, please call with further questions.  Jolly Mango, MD [facility]

## 2021-02-01 ENCOUNTER — Telehealth: Payer: Self-pay

## 2021-02-01 MED ORDER — OCTREOTIDE ACETATE 10 MG IM KIT: 10.0000 mg | PACK | INTRAMUSCULAR | 0 refills | Status: DC

## 2021-02-01 NOTE — Telephone Encounter (Signed)
Rx set formulary on Epic says level 28, requires prior auth  Jan, will you let me know if you get approval

## 2021-02-01 NOTE — Telephone Encounter (Signed)
[place], [facility] [phone]. PA required. They will initiate PA so request will be sent

## 2021-02-01 NOTE — Telephone Encounter (Signed)
-----   Message from Yetta Flock, MD sent at 01/30/2021  7:26 PM EDT ----- Regarding: RE: outpatient follow up Thanks Bonner Larue.  It is octreotide depot 10mg  IM once monthly (reduced dose due to kidney disease). Can you let me know? Thank you  ----- Message ----- From: Marlon Pel, RN Sent: 01/29/2021   2:12 PM EDT To: Yetta Flock, MD Subject: RE: outpatient follow up                       Please give dosage for octreotide and we will send to his pharmacy to see if it will be covered.   ----- Message ----- From: Yetta Flock, MD Sent: 01/29/2021   9:00 AM EDT To: Yevette Edwards, RN Subject: outpatient follow up                           [facility]. This patient should be discharged today. I would like to see him back in 4-6 weeks if any openings. He should have a CBC in one week to make sure stable. Also, any way how we can check to see how much octreotide depot injections would be? I'm not sure if his insurance would cover this or not. Thanks  Dr. Loni Muse

## 2021-02-01 NOTE — Telephone Encounter (Signed)
PA submitted via CoverMyMeds for octreotide Sandostatin LAR (Key: [id number]) - [id number]. Off label use for patient with small bowel AVMs and A fib. Pharamcy indicated the drug without coverage is $3,725

## 2021-02-02 NOTE — Telephone Encounter (Signed)
PA denied by Medicare due to drug is not to be used for a "medically accepted indication" as indicated by the FDA or supported by [place] or [employer] [place].  "If the treating physician would like to discuss with this coverage decision with the physician or health care professional reviewer please call [employer] Prior Authorization department at [phone]".

## 2021-02-02 NOTE — Telephone Encounter (Signed)
Barb Merino, RN has communicated with patient that he needs to come for CBC this week.  Will check on result later in the week and direct to DOD

## 2021-02-02 NOTE — Telephone Encounter (Signed)
Thanks Jan, sorry to hear this, it will be difficult to get this approved as it is off label use. He is scheduled to see me in 1 month for follow up, he should have a CBC done this week to make sure stable, although his nephrologist may already be doing this at HD, which could be easiest as they are already drawing his blood. Any way we can clarify with them to make sure they are doing it? Otherwise he will need blood draw with [place] this week. Of note, I will be out of the office at the end of this week so his lab would need to be reviewed by [employer] to make sure stable if done while I am not here. Thank you

## 2021-02-08 ENCOUNTER — Telehealth: Payer: Self-pay

## 2021-02-08 NOTE — Telephone Encounter (Signed)
Called patient and LM reminding him that he needs to go to the lab today for CBC as he was supposed to go last week but didn't.

## 2021-02-10 NOTE — Telephone Encounter (Signed)
Called [phone] (home) but mailbox is full and I could not leave a message. Called [phone] and spoke to patient. He said he has dialysis on Tuesday, Thursday and Saturday. He said he can't go today (Wednesday) or tomorrow, Thursday but he will go Friday morning. I let him know that it is very important that we recheck his hgb to make sure it is stable and he expressed understanding, he indicated the feels well.

## 2021-02-19 ENCOUNTER — Ambulatory Visit: Payer: 59

## 2021-02-19 NOTE — Progress Notes (Deleted)
[facility] [place] HISTORY AND PHYSICAL   History of Present Illness:  Patient is a 65 y.o. year old male who presents for placement of a permanent hemodialysis access. The patient is {handedness:315516} .  The patient {Is/is not:9024} currently on hemodialysis.  The cause of renal failure is thought to be secondary to ***.  Other chronic medical problems include ***.  Past Medical History:  Diagnosis Date   Anemia    CAD (coronary artery disease)    a. 03/2018: cath showing 80 to 30% stenosis along the LAD, luminal irregularities along the RCA, and angiographically normal LCx   COPD (chronic obstructive pulmonary disease) (HCC)    ESRD (end stage renal disease) on dialysis [facility])    "MWF; Jeneen Rinks" (07/22/17)   Hepatitis C    Still positive s/p liver biopsy at [facility]  and interferon therapy for 6 months. Most recent lab work was on 10/24/12   Hepatitis C    "took the tx; gone now" (12/05/2016)  Was treated   History of blood transfusion ~ 2012/2013   "related to my kidneys; blood was low"   Hypertension    Peripheral arterial disease ([place])    Substance abuse ([place])    Thyroid disease     Past Surgical History:  Procedure Laterality Date   ABDOMINAL AORTOGRAM W/LOWER EXTREMITY N/A 02/08/2018   Procedure: ABDOMINAL AORTOGRAM W/LOWER EXTREMITY;  Surgeon: Conrad Nokomis, MD;  Location: [place] CV LAB;  Service: Cardiovascular;  Laterality: N/A;   ABDOMINAL AORTOGRAM W/LOWER EXTREMITY Bilateral 06/15/2020   Procedure: ABDOMINAL AORTOGRAM W/LOWER EXTREMITY;  Surgeon: Waynetta Sandy, MD;  Location: [place] CV LAB;  Service: Cardiovascular;  Laterality: Bilateral;   AV FISTULA PLACEMENT Left Aug. 2013 ?   BIOPSY  01/17/2020   Procedure: BIOPSY;  Surgeon: Jackquline Denmark, MD;  Location: [place];  Service: Endoscopy;;   COLONOSCOPY WITH PROPOFOL N/A 08/21/2018   Procedure: COLONOSCOPY WITH PROPOFOL;  Surgeon: Yetta Flock, MD;  Location: [place];  Service: Gastroenterology;  Laterality: N/A;   ENTEROSCOPY N/A 01/17/2020   Procedure: ENTEROSCOPY;  Surgeon: Jackquline Denmark, MD;  Location: [facility] ENDOSCOPY;  Service: Endoscopy;  Laterality: N/A;   ENTEROSCOPY N/A 07/31/2020   Procedure: ENTEROSCOPY;  Surgeon: Doran Stabler, MD;  Location: [place];  Service: Gastroenterology;  Laterality: N/A;   ENTEROSCOPY N/A 01/27/2021   Procedure: ENTEROSCOPY;  Surgeon: Yetta Flock, MD;  Location: [facility] ENDOSCOPY;  Service: Gastroenterology;  Laterality: N/A;   ESOPHAGOGASTRODUODENOSCOPY (EGD) WITH PROPOFOL N/A 08/20/2018   Procedure: ESOPHAGOGASTRODUODENOSCOPY (EGD) WITH PROPOFOL;  Surgeon: Gatha Mayer, MD;  Location: [place];  Service: Endoscopy;  Laterality: N/A;   FEMORAL-POPLITEAL BYPASS GRAFT Left 04/11/2018   Procedure: BYPASS GRAFT FEMORAL-POPLITEAL ARTERY LEFT LEG;  Surgeon: Rosetta Posner, MD;  Location: [facility] OR;  Service: Vascular;  Laterality: Left;   GIVENS CAPSULE STUDY N/A 07/31/2020   Procedure: GIVENS CAPSULE STUDY;  Surgeon: Doran Stabler, MD;  Location: [place];  Service: Gastroenterology;  Laterality: N/A;   HOT HEMOSTASIS N/A 08/20/2018   Procedure: HOT HEMOSTASIS (ARGON PLASMA COAGULATION/BICAP);  Surgeon: Gatha Mayer, MD;  Location: [facility] ENDOSCOPY;  Service: Endoscopy;  Laterality: N/A;   HOT HEMOSTASIS N/A 01/17/2020   Procedure: HOT HEMOSTASIS (ARGON PLASMA COAGULATION/BICAP);  Surgeon: Jackquline Denmark, MD;  Location: [facility] ENDOSCOPY;  Service: Endoscopy;  Laterality: N/A;   HOT HEMOSTASIS N/A 01/27/2021   Procedure: HOT HEMOSTASIS (ARGON PLASMA COAGULATION/BICAP);  Surgeon: Yetta Flock, MD;  Location:  [place] ENDOSCOPY;  Service: Gastroenterology;  Laterality: N/A;   LEFT HEART CATH AND CORONARY ANGIOGRAPHY N/A 03/29/2018   Procedure: LEFT HEART CATH AND CORONARY ANGIOGRAPHY;  Surgeon: Nelva Bush, MD;  Location: [place] CV LAB;  Service: Cardiovascular;  Laterality: N/A;   LIVER BIOPSY      ORIF ULNAR FRACTURE Left 05/18/2017   Procedure: OPEN REDUCTION INTERNAL FIXATION (ORIF) ULNAR FRACTURE;  Surgeon: Iran Planas, MD;  Location: [place];  Service: Orthopedics;  Laterality: Left;   PERIPHERAL VASCULAR INTERVENTION Right 06/15/2020   Procedure: PERIPHERAL VASCULAR INTERVENTION;  Surgeon: Waynetta Sandy, MD;  Location: [place] CV LAB;  Service: Cardiovascular;  Laterality: Right;   POLYPECTOMY  08/21/2018   Procedure: POLYPECTOMY;  Surgeon: Yetta Flock, MD;  Location: [place];  Service: Gastroenterology;;   Earney Mallet N/A 12/11/2012   Procedure: Earney Mallet;  Surgeon: Serafina Mitchell, MD;  Location: [facility] CATH LAB;  Service: Cardiovascular;  Laterality: N/A;   WOUND EXPLORATION Left 06/15/2020   Procedure: GROIN  EXPLORATION;  Surgeon: Waynetta Sandy, MD;  Location: [facility] OR;  Service: Vascular;  Laterality: Left;     Social History Social History   Tobacco Use   Smoking status: Every Day    Packs/day: 0.20    Years: 25.00    Pack years: 5.00    Types: Cigarettes    Last attempt to quit: 08/01/2011    Years since quitting: 9.5   Smokeless tobacco: Never   Tobacco comments:    he smokes 6 cigarettes a week  Vaping Use   Vaping Use: Never used  Substance Use Topics   Alcohol use: Yes    Alcohol/week: 10.0 standard drinks    Types: 10 Glasses of wine per week   Drug use: Not Currently    Types: Cocaine    Family History Family History  Problem Relation Age of Onset   Diabetes Father    Hypertension Father    Heart disease Father     Allergies  No Known Allergies   Current Outpatient Medications  Medication Sig Dispense Refill   acetaminophen (TYLENOL) 325 MG tablet Take 650 mg by mouth every 6 (six) hours as needed for mild pain.     albuterol (VENTOLIN HFA) 108 (90 Base) MCG/ACT inhaler Inhale 2 puffs into the lungs every 4 (four) hours as needed for shortness of breath or wheezing.     amiodarone (PACERONE) 200 MG tablet Take  1 tablet (200 mg total) by mouth daily. 90 tablet 1   carvedilol (COREG) 6.25 MG tablet Take 1 tablet (6.25 mg total) by mouth 2 (two) times daily with a meal. 180 tablet 1   cinacalcet (SENSIPAR) 30 MG tablet Take 6 tablets (180 mg total) by mouth Every Tuesday,Thursday,and Saturday with dialysis. 60 tablet 1   folic acid (FOLVITE) 1 MG tablet Take 1 tablet (1 mg total) by mouth daily. 90 tablet 1   Multiple Vitamin (MULTIVITAMIN WITH MINERALS) TABS tablet Take 1 tablet by mouth daily. 90 tablet 1   nicotine (NICODERM CQ - DOSED IN MG/24 HOURS) 14 mg/24hr patch Place 1 patch (14 mg total) onto the skin daily. 28 patch 0   octreotide (SANDOSTATIN LAR) 10 MG injection Inject 10 mg into the muscle every 28 (twenty-eight) days. 1 each 0   pantoprazole (PROTONIX) 40 MG tablet Take 40 mg by mouth daily.     prochlorperazine (COMPAZINE) 5 MG tablet Take 1 tablet (5 mg total) by mouth every 8 (eight) hours as needed for nausea or  vomiting. 20 tablet 0   QUEtiapine (SEROQUEL) 100 MG tablet Take 100 mg by mouth at bedtime.     rosuvastatin (CRESTOR) 10 MG tablet Take 1 tablet (10 mg total) by mouth daily. 90 tablet 1   sevelamer carbonate (RENVELA) 800 MG tablet Take 4 tablets (3,200 mg total) by mouth 3 (three) times daily with meals. 120 tablet 0   thiamine 100 MG tablet Take 1 tablet (100 mg total) by mouth daily. 90 tablet 1   No current facility-administered medications for this visit.   Facility-Administered Medications Ordered in Other Visits  Medication Dose Route Frequency Provider Last Rate Last Admin   midazolam (VERSED) 5 MG/5ML injection    Anesthesia Intra-op Sammie Bench, CRNA   2 mg at 04/11/18 1042    ROS:   General:  No weight loss, Fever, chills  HEENT: No recent headaches, no nasal bleeding, no visual changes, no sore throat  Neurologic: No dizziness, blackouts, seizures. No recent symptoms of stroke or mini- stroke. No recent episodes of slurred speech, or temporary  blindness.  Cardiac: No recent episodes of chest pain/pressure, no shortness of breath at rest.  No shortness of breath with exertion.  Denies history of atrial fibrillation or irregular heartbeat  Vascular: No history of rest pain in feet.  No history of claudication.  No history of non-healing ulcer, No history of DVT   Pulmonary: No home oxygen, no productive cough, no hemoptysis,  No asthma or wheezing  Musculoskeletal:  [ ]  Arthritis, [ ]  Low back pain,  [ ]  Joint pain  Hematologic:No history of hypercoagulable state.  No history of easy bleeding.  No history of anemia  Gastrointestinal: No hematochezia or melena,  No gastroesophageal reflux, no trouble swallowing  Urinary: [ ]  chronic Kidney disease, [ ]  on HD - [ ]  MWF or [ ]  TTHS, [ ]  Burning with urination, [ ]  Frequent urination, [ ]  Difficulty urinating;   Skin: No rashes  Psychological: No history of anxiety,  No history of depression   Physical Examination  There were no vitals filed for this visit.  There is no height or weight on file to calculate BMI.  General:  Alert and oriented, no acute distress HEENT: Normal Neck: No bruit or JVD Pulmonary: Clear to auscultation bilaterally Cardiac: Regular Rate and Rhythm without murmur Gastrointestinal: Soft, non-tender, non-distended, no mass, no scars Skin: No rash Extremity Pulses:  2+ radial, brachial pulses bilaterally Musculoskeletal: No deformity or edema  Neurologic: Upper and lower extremity motor 5/5 and symmetric  DATA:    ASSESSMENT:    PLAN:  [name] PA-C [facility] [place] Office: [phone]  MD on call Trula Slade

## 2021-03-03 ENCOUNTER — Ambulatory Visit (INDEPENDENT_AMBULATORY_CARE_PROVIDER_SITE_OTHER): Payer: 59 | Admitting: Physician Assistant

## 2021-03-03 ENCOUNTER — Encounter: Payer: Self-pay | Admitting: Physician Assistant

## 2021-03-03 ENCOUNTER — Ambulatory Visit: Payer: 59 | Admitting: Gastroenterology

## 2021-03-03 ENCOUNTER — Other Ambulatory Visit: Payer: Self-pay

## 2021-03-03 VITALS — BP 97/65 | HR 89 | Temp 98.0°F | Ht 68.0 in | Wt 155.5 lb

## 2021-03-03 DIAGNOSIS — Z992 Dependence on renal dialysis: Secondary | ICD-10-CM | POA: Diagnosis not present

## 2021-03-03 DIAGNOSIS — I739 Peripheral vascular disease, unspecified: Secondary | ICD-10-CM

## 2021-03-03 DIAGNOSIS — N186 End stage renal disease: Secondary | ICD-10-CM | POA: Diagnosis not present

## 2021-03-03 NOTE — Progress Notes (Signed)
Office Note     CC:  follow up Requesting Provider:  [name], FNP  HPI: Raymond [name] is a 65 y.o. (30-Jun-1956) male who presents for evaluation of left forearm fistula.  He has several areas of skin changes along radiocephalic fistula with question of risk for spontaneous bleeding.  He states the fistula is working well for dialysis.  He dialyzes at [facility] on Tuesday Thursday Saturday.  He denies any bleeding episodes.  He states he has never had revision surgery for his fistula.  He is also followed for PAD with history of right iliac stenting with subsequent left groin hematoma evacuation and repair of left common femoral artery by Dr. Donzetta Matters on 06/15/2020.  He also has history of left femoral to below the knee popliteal artery bypass with vein by Dr. Donnetta Hutching on 06/08/2018.  He denies any claudication, rest pain, or nonhealing wounds of bilateral lower extremities.  He is on aspirin and statin daily.   Past Medical History:  Diagnosis Date   Anemia    CAD (coronary artery disease)    a. 03/2018: cath showing 76 to 30% stenosis along the LAD, luminal irregularities along the RCA, and angiographically normal LCx   COPD (chronic obstructive pulmonary disease) (HCC)    ESRD (end stage renal disease) on dialysis [facility])    "MWF; Jeneen Rinks" (07/22/17)   Hepatitis C    Still positive s/p liver biopsy at [facility]  and interferon therapy for 6 months. Most recent lab work was on 10/24/12   Hepatitis C    "took the tx; gone now" (12/05/2016)  Was treated   History of blood transfusion ~ 2012/2013   "related to my kidneys; blood was low"   Hypertension    Peripheral arterial disease ([place])    Substance abuse ([place])    Thyroid disease     Past Surgical History:  Procedure Laterality Date   ABDOMINAL AORTOGRAM W/LOWER EXTREMITY N/A 02/08/2018   Procedure: ABDOMINAL AORTOGRAM W/LOWER EXTREMITY;  Surgeon: Conrad [place], MD;  Location: [place] CV LAB;  Service: Cardiovascular;   Laterality: N/A;   ABDOMINAL AORTOGRAM W/LOWER EXTREMITY Bilateral 06/15/2020   Procedure: ABDOMINAL AORTOGRAM W/LOWER EXTREMITY;  Surgeon: Waynetta Sandy, MD;  Location: [place] CV LAB;  Service: Cardiovascular;  Laterality: Bilateral;   AV FISTULA PLACEMENT Left Aug. 2013 ?   BIOPSY  01/17/2020   Procedure: BIOPSY;  Surgeon: Jackquline Denmark, MD;  Location: [place];  Service: Endoscopy;;   COLONOSCOPY WITH PROPOFOL N/A 08/21/2018   Procedure: COLONOSCOPY WITH PROPOFOL;  Surgeon: Yetta Flock, MD;  Location: [place];  Service: Gastroenterology;  Laterality: N/A;   ENTEROSCOPY N/A 01/17/2020   Procedure: ENTEROSCOPY;  Surgeon: Jackquline Denmark, MD;  Location: [facility] ENDOSCOPY;  Service: Endoscopy;  Laterality: N/A;   ENTEROSCOPY N/A 07/31/2020   Procedure: ENTEROSCOPY;  Surgeon: Doran Stabler, MD;  Location: [place];  Service: Gastroenterology;  Laterality: N/A;   ENTEROSCOPY N/A 01/27/2021   Procedure: ENTEROSCOPY;  Surgeon: Yetta Flock, MD;  Location: [facility] ENDOSCOPY;  Service: Gastroenterology;  Laterality: N/A;   ESOPHAGOGASTRODUODENOSCOPY (EGD) WITH PROPOFOL N/A 08/20/2018   Procedure: ESOPHAGOGASTRODUODENOSCOPY (EGD) WITH PROPOFOL;  Surgeon: Gatha Mayer, MD;  Location: [place];  Service: Endoscopy;  Laterality: N/A;   FEMORAL-POPLITEAL BYPASS GRAFT Left 04/11/2018   Procedure: BYPASS GRAFT FEMORAL-POPLITEAL ARTERY LEFT LEG;  Surgeon: Rosetta Posner, MD;  Location: [place];  Service: Vascular;  Laterality: Left;   GIVENS CAPSULE STUDY N/A 07/31/2020  Procedure: GIVENS CAPSULE STUDY;  Surgeon: Doran Stabler, MD;  Location: [place];  Service: Gastroenterology;  Laterality: N/A;   HOT HEMOSTASIS N/A 08/20/2018   Procedure: HOT HEMOSTASIS (ARGON PLASMA COAGULATION/BICAP);  Surgeon: Gatha Mayer, MD;  Location: [facility] ENDOSCOPY;  Service: Endoscopy;  Laterality: N/A;   HOT HEMOSTASIS N/A 01/17/2020   Procedure: HOT HEMOSTASIS (ARGON PLASMA  COAGULATION/BICAP);  Surgeon: Jackquline Denmark, MD;  Location: [facility] ENDOSCOPY;  Service: Endoscopy;  Laterality: N/A;   HOT HEMOSTASIS N/A 01/27/2021   Procedure: HOT HEMOSTASIS (ARGON PLASMA COAGULATION/BICAP);  Surgeon: Yetta Flock, MD;  Location: [facility] ENDOSCOPY;  Service: Gastroenterology;  Laterality: N/A;   LEFT HEART CATH AND CORONARY ANGIOGRAPHY N/A 03/29/2018   Procedure: LEFT HEART CATH AND CORONARY ANGIOGRAPHY;  Surgeon: Nelva Bush, MD;  Location: [place] CV LAB;  Service: Cardiovascular;  Laterality: N/A;   LIVER BIOPSY     ORIF ULNAR FRACTURE Left 05/18/2017   Procedure: OPEN REDUCTION INTERNAL FIXATION (ORIF) ULNAR FRACTURE;  Surgeon: Iran Planas, MD;  Location: [place];  Service: Orthopedics;  Laterality: Left;   PERIPHERAL VASCULAR INTERVENTION Right 06/15/2020   Procedure: PERIPHERAL VASCULAR INTERVENTION;  Surgeon: Waynetta Sandy, MD;  Location: [place] CV LAB;  Service: Cardiovascular;  Laterality: Right;   POLYPECTOMY  08/21/2018   Procedure: POLYPECTOMY;  Surgeon: Yetta Flock, MD;  Location: [place];  Service: Gastroenterology;;   Earney Mallet N/A 12/11/2012   Procedure: Earney Mallet;  Surgeon: Serafina Mitchell, MD;  Location: [facility] CATH LAB;  Service: Cardiovascular;  Laterality: N/A;   WOUND EXPLORATION Left 06/15/2020   Procedure: GROIN  EXPLORATION;  Surgeon: Waynetta Sandy, MD;  Location: [facility] OR;  Service: Vascular;  Laterality: Left;    Social History   Socioeconomic History   Marital status: Single    Spouse name: Not on file   Number of children: 2   Years of education: Not on file   Highest education level: 11th grade  Occupational History   Not on file  Tobacco Use   Smoking status: Every Day    Packs/day: 0.20    Years: 25.00    Pack years: 5.00    Types: Cigarettes    Last attempt to quit: 08/01/2011    Years since quitting: 9.5   Smokeless tobacco: Never   Tobacco comments:    he smokes 6 cigarettes a week   Vaping Use   Vaping Use: Never used  Substance and Sexual Activity   Alcohol use: Yes    Alcohol/week: 10.0 standard drinks    Types: 10 Glasses of wine per week   Drug use: Not Currently    Types: Cocaine   Sexual activity: Not on file  Other Topics Concern   Not on file  Social History Narrative   Patient is living in a apartment alone. Professional cement finisher. Has 2 children (50yo & 59yo sons). Plans to make HCPOA for sister Neoma Laming to make decisions for him should he need them.    Social Determinants of Health   Financial Resource Strain: Not on file  Food Insecurity: Not on file  Transportation Needs: Not on file  Physical Activity: Not on file  Stress: Not on file  Social Connections: Not on file  Intimate Partner Violence: Not on file    Family History  Problem Relation Age of Onset   Diabetes Father    Hypertension Father    Heart disease Father     Current Outpatient Medications  Medication Sig Dispense Refill  acetaminophen (TYLENOL) 325 MG tablet Take 650 mg by mouth every 6 (six) hours as needed for mild pain.     albuterol (VENTOLIN HFA) 108 (90 Base) MCG/ACT inhaler Inhale 2 puffs into the lungs every 4 (four) hours as needed for shortness of breath or wheezing.     amiodarone (PACERONE) 200 MG tablet Take 1 tablet (200 mg total) by mouth daily. 90 tablet 1   amLODipine (NORVASC) 2.5 MG tablet Take 2.5 mg by mouth daily. Take 1 tablet by mouth every night     carvedilol (COREG) 6.25 MG tablet Take 1 tablet (6.25 mg total) by mouth 2 (two) times daily with a meal. 180 tablet 1   cinacalcet (SENSIPAR) 30 MG tablet Take 6 tablets (180 mg total) by mouth Every Tuesday,Thursday,and Saturday with dialysis. 60 tablet 1   folic acid (FOLVITE) 1 MG tablet Take 1 tablet (1 mg total) by mouth daily. 90 tablet 1   metoprolol succinate (TOPROL-XL) 50 MG 24 hr tablet Take 50 mg by mouth daily. Take 2 tablets by mouth every day with or immediately following a meal.      Multiple Vitamin (MULTIVITAMIN WITH MINERALS) TABS tablet Take 1 tablet by mouth daily. 90 tablet 1   nicotine (NICODERM CQ - DOSED IN MG/24 HOURS) 14 mg/24hr patch Place 1 patch (14 mg total) onto the skin daily. 28 patch 0   octreotide (SANDOSTATIN LAR) 10 MG injection Inject 10 mg into the muscle every 28 (twenty-eight) days. 1 each 0   pantoprazole (PROTONIX) 40 MG tablet Take 40 mg by mouth daily.     prochlorperazine (COMPAZINE) 5 MG tablet Take 1 tablet (5 mg total) by mouth every 8 (eight) hours as needed for nausea or vomiting. 20 tablet 0   QUEtiapine (SEROQUEL) 100 MG tablet Take 100 mg by mouth at bedtime.     rosuvastatin (CRESTOR) 10 MG tablet Take 1 tablet (10 mg total) by mouth daily. 90 tablet 1   sevelamer carbonate (RENVELA) 800 MG tablet Take 4 tablets (3,200 mg total) by mouth 3 (three) times daily with meals. 120 tablet 0   thiamine 100 MG tablet Take 1 tablet (100 mg total) by mouth daily. 90 tablet 1   No current facility-administered medications for this visit.   Facility-Administered Medications Ordered in Other Visits  Medication Dose Route Frequency Provider Last Rate Last Admin   midazolam (VERSED) 5 MG/5ML injection    Anesthesia Intra-op Sammie Bench, CRNA   2 mg at 04/11/18 1042    No Known Allergies   REVIEW OF SYSTEMS:   [X]  denotes positive finding, [ ]  denotes negative finding Cardiac  Comments:  Chest pain or chest pressure:    Shortness of breath upon exertion:    Short of breath when lying flat:    Irregular heart rhythm:        Vascular    Pain in calf, thigh, or hip brought on by ambulation:    Pain in feet at night that wakes you up from your sleep:     Blood clot in your veins:    Leg swelling:         Pulmonary    Oxygen at home:    Productive cough:     Wheezing:         Neurologic    Sudden weakness in arms or legs:     Sudden numbness in arms or legs:     Sudden onset of difficulty speaking or slurred speech:  Temporary loss of vision in one eye:     Problems with dizziness:         Gastrointestinal    Blood in stool:     Vomited blood:         Genitourinary    Burning when urinating:     Blood in urine:        Psychiatric    Major depression:         Hematologic    Bleeding problems:    Problems with blood clotting too easily:        Skin    Rashes or ulcers:        Constitutional    Fever or chills:      PHYSICAL EXAMINATION:  Vitals:   03/03/21 1132  BP: 97/65  Pulse: 89  Temp: 98 F (36.7 C)  TempSrc: Tympanic  SpO2: 100%  Weight: 155 lb 8 oz (70.5 kg)  Height: 5\' 8"  (1.727 m)    General:  WDWN in NAD; vital signs documented above Gait: Not observed HENT: WNL, normocephalic Pulmonary: normal non-labored breathing , without Rales, rhonchi,  wheezing Cardiac: regular HR Abdomen: soft, NT, no masses Skin: without rashes Extremities: Palpable thrill near arterial anastomosis of left forearm fistula; several areas of hypopigmentation with mobile skin.  No overlying ulcers at risk for spontaneous rupture; feet are warm and well-perfused without ulceration Musculoskeletal: no muscle wasting or atrophy  Neurologic: A&O X 3;  No focal weakness or paresthesias are detected Psychiatric:  The pt has Normal affect.   Non-Invasive Vascular Imaging:        ASSESSMENT/PLAN:: 65 y.o. 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  There are several areas of hypopigmentation however there are no areas of ulceration or scabbing which would be at risk for spontaneous hemorrhage.  Hypopigmented skin is mobile overlying aneurysmal areas.  Given the age of the fistula I would only offer revision if there were scabbing or ulcerations overlying aneurysmal areas.  Patient would also require catheter placement if the fistula were to be revised  -Patient has also been lost to follow-up for PAD with history of right iliac stent and left  Pham pop bypass -I have scheduled him for left leg bypass duplex and ABIs in 3 months.  At that time we will also reevaluate his left forearm fistula   Dagoberto Ligas, PA-C [facility] [phone]  Clinic MD:   Scot Dock

## 2021-03-04 ENCOUNTER — Other Ambulatory Visit: Payer: Self-pay

## 2021-03-04 DIAGNOSIS — I739 Peripheral vascular disease, unspecified: Secondary | ICD-10-CM

## 2021-03-17 ENCOUNTER — Encounter: Payer: Self-pay | Admitting: Podiatry

## 2021-03-17 ENCOUNTER — Other Ambulatory Visit: Payer: Self-pay

## 2021-03-17 ENCOUNTER — Ambulatory Visit (INDEPENDENT_AMBULATORY_CARE_PROVIDER_SITE_OTHER): Payer: 59 | Admitting: Podiatry

## 2021-03-17 DIAGNOSIS — M79676 Pain in unspecified toe(s): Secondary | ICD-10-CM

## 2021-03-17 DIAGNOSIS — B351 Tinea unguium: Secondary | ICD-10-CM

## 2021-03-17 NOTE — Progress Notes (Signed)
   SUBJECTIVE Patient presents to office today complaining of elongated, thickened nails that cause pain while ambulating in shoes.  Patient is unable to trim their own nails. Patient is here for further evaluation and treatment.  Past Medical History:  Diagnosis Date   Anemia    CAD (coronary artery disease)    a. 03/2018: cath showing 21 to 30% stenosis along the LAD, luminal irregularities along the RCA, and angiographically normal LCx   COPD (chronic obstructive pulmonary disease) (HCC)    ESRD (end stage renal disease) on dialysis [facility])    "MWF; Jeneen Rinks" (07/22/17)   Hepatitis C    Still positive s/p liver biopsy at [facility]  and interferon therapy for 6 months. Most recent lab work was on 10/24/12   Hepatitis C    "took the tx; gone now" (12/05/2016)  Was treated   History of blood transfusion ~ 2012/2013   "related to my kidneys; blood was low"   Hypertension    Peripheral arterial disease ([place])    Substance abuse ([place])    Thyroid disease    Type 2 diabetes mellitus with other diabetic kidney complication ([place]) 3/83/2919    OBJECTIVE General Patient is awake, alert, and oriented x 3 and in no acute distress. Derm Skin is dry and supple bilateral. Negative open lesions or macerations. Remaining integument unremarkable. Nails are tender, long, thickened and dystrophic with subungual debris, consistent with onychomycosis, 1-5 bilateral. No signs of infection noted. Vasc  DP and PT pedal pulses palpable bilaterally. Temperature gradient within normal limits.  Neuro Epicritic and protective threshold sensation grossly intact bilaterally.  Musculoskeletal Exam No symptomatic pedal deformities noted bilateral. Muscular strength within normal limits.  ASSESSMENT 1.  Pain due to onychomycosis of toenails both  PLAN OF CARE 1. Patient evaluated today.  2. Instructed to maintain good pedal hygiene and foot care.  3. Mechanical debridement of nails 1-5 bilaterally performed using a  nail nipper. Filed with dremel without incident.  4. Return to clinic in 3 mos.    Edrick Kins, DPM [facility]  Dr. Edrick Kins, DPM    [address], [place] [address]                Office [phone]  Fax [phone]

## 2021-04-03 ENCOUNTER — Inpatient Hospital Stay (HOSPITAL_COMMUNITY): Payer: 59

## 2021-04-03 ENCOUNTER — Emergency Department (HOSPITAL_COMMUNITY): Payer: 59

## 2021-04-03 ENCOUNTER — Inpatient Hospital Stay (HOSPITAL_COMMUNITY)
Admission: EM | Admit: 2021-04-03 | Discharge: 2021-04-15 | DRG: 637 | Disposition: A | Discharge status: Skilled Nursing Facility | Payer: 59 | Attending: Internal Medicine | Admitting: Internal Medicine

## 2021-04-03 DIAGNOSIS — E8721 Acute metabolic acidosis: Secondary | ICD-10-CM | POA: Diagnosis present

## 2021-04-03 DIAGNOSIS — R680 Hypothermia, not associated with low environmental temperature: Secondary | ICD-10-CM

## 2021-04-03 DIAGNOSIS — I469 Cardiac arrest, cause unspecified: Secondary | ICD-10-CM | POA: Diagnosis present

## 2021-04-03 DIAGNOSIS — G253 Myoclonus: Secondary | ICD-10-CM | POA: Diagnosis not present

## 2021-04-03 DIAGNOSIS — B1920 Unspecified viral hepatitis C without hepatic coma: Secondary | ICD-10-CM | POA: Diagnosis present

## 2021-04-03 DIAGNOSIS — M898X9 Other specified disorders of bone, unspecified site: Secondary | ICD-10-CM | POA: Diagnosis present

## 2021-04-03 DIAGNOSIS — I132 Hypertensive heart and chronic kidney disease with heart failure and with stage 5 chronic kidney disease, or end stage renal disease: Secondary | ICD-10-CM | POA: Diagnosis present

## 2021-04-03 DIAGNOSIS — E162 Hypoglycemia, unspecified: Secondary | ICD-10-CM

## 2021-04-03 DIAGNOSIS — R52 Pain, unspecified: Secondary | ICD-10-CM

## 2021-04-03 DIAGNOSIS — E1165 Type 2 diabetes mellitus with hyperglycemia: Secondary | ICD-10-CM | POA: Diagnosis present

## 2021-04-03 DIAGNOSIS — J449 Chronic obstructive pulmonary disease, unspecified: Secondary | ICD-10-CM | POA: Diagnosis present

## 2021-04-03 DIAGNOSIS — E11649 Type 2 diabetes mellitus with hypoglycemia without coma: Principal | ICD-10-CM | POA: Diagnosis present

## 2021-04-03 DIAGNOSIS — I468 Cardiac arrest due to other underlying condition: Secondary | ICD-10-CM | POA: Diagnosis present

## 2021-04-03 DIAGNOSIS — N2581 Secondary hyperparathyroidism of renal origin: Secondary | ICD-10-CM | POA: Diagnosis present

## 2021-04-03 DIAGNOSIS — Z4659 Encounter for fitting and adjustment of other gastrointestinal appliance and device: Secondary | ICD-10-CM

## 2021-04-03 DIAGNOSIS — E079 Disorder of thyroid, unspecified: Secondary | ICD-10-CM | POA: Diagnosis present

## 2021-04-03 DIAGNOSIS — J9621 Acute and chronic respiratory failure with hypoxia: Secondary | ICD-10-CM | POA: Diagnosis present

## 2021-04-03 DIAGNOSIS — G928 Other toxic encephalopathy: Secondary | ICD-10-CM

## 2021-04-03 DIAGNOSIS — D631 Anemia in chronic kidney disease: Secondary | ICD-10-CM | POA: Diagnosis present

## 2021-04-03 DIAGNOSIS — G931 Anoxic brain damage, not elsewhere classified: Secondary | ICD-10-CM | POA: Diagnosis present

## 2021-04-03 DIAGNOSIS — R609 Edema, unspecified: Secondary | ICD-10-CM | POA: Diagnosis not present

## 2021-04-03 DIAGNOSIS — R001 Bradycardia, unspecified: Secondary | ICD-10-CM | POA: Diagnosis not present

## 2021-04-03 DIAGNOSIS — I5022 Chronic systolic (congestive) heart failure: Secondary | ICD-10-CM | POA: Diagnosis present

## 2021-04-03 DIAGNOSIS — E1151 Type 2 diabetes mellitus with diabetic peripheral angiopathy without gangrene: Secondary | ICD-10-CM | POA: Diagnosis present

## 2021-04-03 DIAGNOSIS — Z8249 Family history of ischemic heart disease and other diseases of the circulatory system: Secondary | ICD-10-CM

## 2021-04-03 DIAGNOSIS — I480 Paroxysmal atrial fibrillation: Secondary | ICD-10-CM | POA: Diagnosis present

## 2021-04-03 DIAGNOSIS — N186 End stage renal disease: Secondary | ICD-10-CM

## 2021-04-03 DIAGNOSIS — I248 Other forms of acute ischemic heart disease: Secondary | ICD-10-CM | POA: Diagnosis present

## 2021-04-03 DIAGNOSIS — Z992 Dependence on renal dialysis: Secondary | ICD-10-CM

## 2021-04-03 DIAGNOSIS — I5033 Acute on chronic diastolic (congestive) heart failure: Secondary | ICD-10-CM | POA: Diagnosis present

## 2021-04-03 DIAGNOSIS — D61818 Other pancytopenia: Secondary | ICD-10-CM | POA: Diagnosis present

## 2021-04-03 DIAGNOSIS — E875 Hyperkalemia: Secondary | ICD-10-CM

## 2021-04-03 DIAGNOSIS — F17210 Nicotine dependence, cigarettes, uncomplicated: Secondary | ICD-10-CM | POA: Diagnosis present

## 2021-04-03 DIAGNOSIS — I2510 Atherosclerotic heart disease of native coronary artery without angina pectoris: Secondary | ICD-10-CM | POA: Diagnosis present

## 2021-04-03 DIAGNOSIS — I5020 Unspecified systolic (congestive) heart failure: Secondary | ICD-10-CM

## 2021-04-03 DIAGNOSIS — H5702 Anisocoria: Secondary | ICD-10-CM

## 2021-04-03 DIAGNOSIS — R569 Unspecified convulsions: Secondary | ICD-10-CM

## 2021-04-03 DIAGNOSIS — E1122 Type 2 diabetes mellitus with diabetic chronic kidney disease: Secondary | ICD-10-CM | POA: Diagnosis present

## 2021-04-03 DIAGNOSIS — Z20822 Contact with and (suspected) exposure to covid-19: Secondary | ICD-10-CM | POA: Diagnosis present

## 2021-04-03 DIAGNOSIS — Z833 Family history of diabetes mellitus: Secondary | ICD-10-CM

## 2021-04-03 DIAGNOSIS — E871 Hypo-osmolality and hyponatremia: Secondary | ICD-10-CM | POA: Diagnosis not present

## 2021-04-03 DIAGNOSIS — R9431 Abnormal electrocardiogram [ECG] [EKG]: Secondary | ICD-10-CM | POA: Diagnosis not present

## 2021-04-03 DIAGNOSIS — T68XXXA Hypothermia, initial encounter: Secondary | ICD-10-CM

## 2021-04-03 DIAGNOSIS — J9622 Acute and chronic respiratory failure with hypercapnia: Secondary | ICD-10-CM | POA: Diagnosis present

## 2021-04-03 LAB — CBC
HCT: 37 % — ABNORMAL LOW (ref 39.0–52.0)
Hemoglobin: 11.4 g/dL — ABNORMAL LOW (ref 13.0–17.0)
MCH: 33.3 pg (ref 26.0–34.0)
MCHC: 30.8 g/dL (ref 30.0–36.0)
MCV: 108.2 fL — ABNORMAL HIGH (ref 80.0–100.0)
Platelets: 57 10*3/uL — ABNORMAL LOW (ref 150–400)
RBC: 3.42 MIL/uL — ABNORMAL LOW (ref 4.22–5.81)
RDW: 18.3 % — ABNORMAL HIGH (ref 11.5–15.5)
WBC: 0.7 10*3/uL — CL (ref 4.0–10.5)
nRBC: 0 % (ref 0.0–0.2)

## 2021-04-03 LAB — BASIC METABOLIC PANEL
Anion gap: 26 — ABNORMAL HIGH (ref 5–15)
Anion gap: 24 — ABNORMAL HIGH (ref 5–15)
Anion gap: 26 — ABNORMAL HIGH (ref 5–15)
Anion gap: 20 — ABNORMAL HIGH (ref 5–15)
BUN: 94 mg/dL — ABNORMAL HIGH (ref 8–23)
BUN: 104 mg/dL — ABNORMAL HIGH (ref 8–23)
BUN: 111 mg/dL — ABNORMAL HIGH (ref 8–23)
BUN: 115 mg/dL — ABNORMAL HIGH (ref 8–23)
CO2: 13 mmol/L — ABNORMAL LOW (ref 22–32)
CO2: 16 mmol/L — ABNORMAL LOW (ref 22–32)
CO2: 14 mmol/L — ABNORMAL LOW (ref 22–32)
CO2: 19 mmol/L — ABNORMAL LOW (ref 22–32)
Calcium: 9.1 mg/dL (ref 8.9–10.3)
Calcium: 9 mg/dL (ref 8.9–10.3)
Calcium: 8.8 mg/dL — ABNORMAL LOW (ref 8.9–10.3)
Calcium: 8.9 mg/dL (ref 8.9–10.3)
Chloride: 96 mmol/L — ABNORMAL LOW (ref 98–111)
Chloride: 100 mmol/L (ref 98–111)
Chloride: 101 mmol/L (ref 98–111)
Chloride: 102 mmol/L (ref 98–111)
Creatinine, Ser: 18.17 mg/dL — ABNORMAL HIGH (ref 0.61–1.24)
Creatinine, Ser: 19.63 mg/dL — ABNORMAL HIGH (ref 0.61–1.24)
Creatinine, Ser: 20.24 mg/dL — ABNORMAL HIGH (ref 0.61–1.24)
Creatinine, Ser: 20 mg/dL — ABNORMAL HIGH (ref 0.61–1.24)
GFR, Estimated: 3 mL/min — ABNORMAL LOW (ref >60)
GFR, Estimated: 2 mL/min — ABNORMAL LOW (ref >60)
GFR, Estimated: 2 mL/min — ABNORMAL LOW (ref >60)
GFR, Estimated: 2 mL/min — ABNORMAL LOW (ref >60)
Glucose, Bld: 721 mg/dL (ref 70–99)
Glucose, Bld: 329 mg/dL — ABNORMAL HIGH (ref 70–99)
Glucose, Bld: 224 mg/dL — ABNORMAL HIGH (ref 70–99)
Glucose, Bld: 173 mg/dL — ABNORMAL HIGH (ref 70–99)
Potassium: 5.6 mmol/L — ABNORMAL HIGH (ref 3.5–5.1)
Potassium: 5.1 mmol/L (ref 3.5–5.1)
Potassium: 5.3 mmol/L — ABNORMAL HIGH (ref 3.5–5.1)
Potassium: 5.1 mmol/L (ref 3.5–5.1)
Sodium: 135 mmol/L (ref 135–145)
Sodium: 140 mmol/L (ref 135–145)
Sodium: 141 mmol/L (ref 135–145)
Sodium: 141 mmol/L (ref 135–145)

## 2021-04-03 LAB — CK TOTAL AND CKMB (NOT AT ARMC)
CK, MB: 18 ng/mL — ABNORMAL HIGH (ref 0.5–5.0)
Relative Index: 2.2 (ref 0.0–2.5)
Total CK: 815 U/L — ABNORMAL HIGH (ref 49–397)

## 2021-04-03 LAB — POCT I-STAT 7, (LYTES, BLD GAS, ICA,H+H)
Acid-base deficit: 12 mmol/L — ABNORMAL HIGH (ref 0.0–2.0)
Bicarbonate: 14.1 mmol/L — ABNORMAL LOW (ref 20.0–28.0)
Calcium, Ion: 1.17 mmol/L (ref 1.15–1.40)
HCT: 34 % — ABNORMAL LOW (ref 39.0–52.0)
Hemoglobin: 11.6 g/dL — ABNORMAL LOW (ref 13.0–17.0)
O2 Saturation: 95 %
Patient temperature: 80.8
Potassium: 6.5 mmol/L (ref 3.5–5.1)
Sodium: 138 mmol/L (ref 135–145)
TCO2: 15 mmol/L — ABNORMAL LOW (ref 22–32)
pCO2 arterial: 20.5 mmHg — ABNORMAL LOW (ref 32.0–48.0)
pH, Arterial: 7.392 (ref 7.350–7.450)
pO2, Arterial: 44 mmHg — ABNORMAL LOW (ref 83.0–108.0)

## 2021-04-03 LAB — I-STAT CHEM 8, ED
BUN: 122 mg/dL — ABNORMAL HIGH (ref 8–23)
Calcium, Ion: 0.97 mmol/L — ABNORMAL LOW (ref 1.15–1.40)
Chloride: 110 mmol/L (ref 98–111)
Creatinine, Ser: >18 mg/dL — ABNORMAL HIGH (ref 0.61–1.24)
Glucose, Bld: 96 mg/dL (ref 70–99)
HCT: 39 % (ref 39.0–52.0)
Hemoglobin: 13.3 g/dL (ref 13.0–17.0)
Potassium: 6 mmol/L — ABNORMAL HIGH (ref 3.5–5.1)
Sodium: 139 mmol/L (ref 135–145)
TCO2: 17 mmol/L — ABNORMAL LOW (ref 22–32)

## 2021-04-03 LAB — COMPREHENSIVE METABOLIC PANEL
ALT: 18 U/L (ref 0–44)
AST: 29 U/L (ref 15–41)
Albumin: 3.1 g/dL — ABNORMAL LOW (ref 3.5–5.0)
Alkaline Phosphatase: 55 U/L (ref 38–126)
Anion gap: 25 — ABNORMAL HIGH (ref 5–15)
BUN: 100 mg/dL — ABNORMAL HIGH (ref 8–23)
CO2: 14 mmol/L — ABNORMAL LOW (ref 22–32)
Calcium: 9.3 mg/dL (ref 8.9–10.3)
Chloride: 103 mmol/L (ref 98–111)
Creatinine, Ser: 20.48 mg/dL — ABNORMAL HIGH (ref 0.61–1.24)
GFR, Estimated: 2 mL/min — ABNORMAL LOW (ref >60)
Glucose, Bld: 103 mg/dL — ABNORMAL HIGH (ref 70–99)
Potassium: 5.8 mmol/L — ABNORMAL HIGH (ref 3.5–5.1)
Sodium: 142 mmol/L (ref 135–145)
Total Bilirubin: 0.7 mg/dL (ref 0.3–1.2)
Total Protein: 7.1 g/dL (ref 6.5–8.1)

## 2021-04-03 LAB — TROPONIN I (HIGH SENSITIVITY)
Troponin I (High Sensitivity): 124 ng/L (ref <18)
Troponin I (High Sensitivity): 130 ng/L (ref <18)
Troponin I (High Sensitivity): 242 ng/L (ref <18)

## 2021-04-03 LAB — GLUCOSE, CAPILLARY
Glucose-Capillary: 234 mg/dL — ABNORMAL HIGH (ref 70–99)
Glucose-Capillary: 267 mg/dL — ABNORMAL HIGH (ref 70–99)
Glucose-Capillary: 310 mg/dL — ABNORMAL HIGH (ref 70–99)
Glucose-Capillary: 346 mg/dL — ABNORMAL HIGH (ref 70–99)
Glucose-Capillary: 360 mg/dL — ABNORMAL HIGH (ref 70–99)
Glucose-Capillary: 290 mg/dL — ABNORMAL HIGH (ref 70–99)
Glucose-Capillary: 223 mg/dL — ABNORMAL HIGH (ref 70–99)
Glucose-Capillary: 185 mg/dL — ABNORMAL HIGH (ref 70–99)
Glucose-Capillary: 106 mg/dL — ABNORMAL HIGH (ref 70–99)

## 2021-04-03 LAB — I-STAT ARTERIAL BLOOD GAS, ED
Acid-Base Excess: 2 mmol/L (ref 0.0–2.0)
Bicarbonate: 32.2 mmol/L — ABNORMAL HIGH (ref 20.0–28.0)
Calcium, Ion: 1.8 mmol/L (ref 1.15–1.40)
HCT: 31 % — ABNORMAL LOW (ref 39.0–52.0)
Hemoglobin: 10.5 g/dL — ABNORMAL LOW (ref 13.0–17.0)
O2 Saturation: 100 %
Patient temperature: 89
Potassium: 4.8 mmol/L (ref 3.5–5.1)
Sodium: 149 mmol/L — ABNORMAL HIGH (ref 135–145)
TCO2: 35 mmol/L — ABNORMAL HIGH (ref 22–32)
pCO2 arterial: 68.5 mmHg (ref 32.0–48.0)
pH, Arterial: 7.25 — ABNORMAL LOW (ref 7.350–7.450)
pO2, Arterial: 266 mmHg — ABNORMAL HIGH (ref 83.0–108.0)

## 2021-04-03 LAB — CBC WITH DIFFERENTIAL/PLATELET
Abs Immature Granulocytes: 0.01 10*3/uL (ref 0.00–0.07)
Basophils Absolute: 0 10*3/uL (ref 0.0–0.1)
Basophils Relative: 0 %
Eosinophils Absolute: 0 10*3/uL (ref 0.0–0.5)
Eosinophils Relative: 0 %
HCT: 39 % (ref 39.0–52.0)
Hemoglobin: 11.6 g/dL — ABNORMAL LOW (ref 13.0–17.0)
Immature Granulocytes: 1 %
Lymphocytes Relative: 29 %
Lymphs Abs: 0.4 10*3/uL — ABNORMAL LOW (ref 0.7–4.0)
MCH: 34.1 pg — ABNORMAL HIGH (ref 26.0–34.0)
MCHC: 29.7 g/dL — ABNORMAL LOW (ref 30.0–36.0)
MCV: 114.7 fL — ABNORMAL HIGH (ref 80.0–100.0)
Monocytes Absolute: 0.3 10*3/uL (ref 0.1–1.0)
Monocytes Relative: 23 %
Neutro Abs: 0.7 10*3/uL — ABNORMAL LOW (ref 1.7–7.7)
Neutrophils Relative %: 47 %
Platelets: 62 10*3/uL — ABNORMAL LOW (ref 150–400)
RBC: 3.4 MIL/uL — ABNORMAL LOW (ref 4.22–5.81)
RDW: 18.6 % — ABNORMAL HIGH (ref 11.5–15.5)
WBC: 1.5 10*3/uL — ABNORMAL LOW (ref 4.0–10.5)
nRBC: 0 % (ref 0.0–0.2)

## 2021-04-03 LAB — ETHANOL: Alcohol, Ethyl (B): <10 mg/dL (ref <10)

## 2021-04-03 LAB — LACTIC ACID, PLASMA
Lactic Acid, Venous: 6.1 mmol/L (ref 0.5–1.9)
Lactic Acid, Venous: 5.1 mmol/L (ref 0.5–1.9)

## 2021-04-03 LAB — HEPATITIS B SURFACE ANTIBODY,QUALITATIVE: Hep B S Ab: REACTIVE — AB

## 2021-04-03 LAB — ACETAMINOPHEN LEVEL: Acetaminophen (Tylenol), Serum: <10 ug/mL — ABNORMAL LOW (ref 10–30)

## 2021-04-03 LAB — HEPATITIS B SURFACE ANTIGEN: Hepatitis B Surface Ag: NONREACTIVE

## 2021-04-03 LAB — CBG MONITORING, ED
Glucose-Capillary: 13 mg/dL — CL (ref 70–99)
Glucose-Capillary: 203 mg/dL — ABNORMAL HIGH (ref 70–99)
Glucose-Capillary: 95 mg/dL (ref 70–99)

## 2021-04-03 LAB — RESP PANEL BY RT-PCR (FLU A&B, COVID) ARPGX2
Influenza A by PCR: NEGATIVE
Influenza B by PCR: NEGATIVE
SARS Coronavirus 2 by RT PCR: NEGATIVE

## 2021-04-03 LAB — BETA-HYDROXYBUTYRIC ACID: Beta-Hydroxybutyric Acid: 1.65 mmol/L — ABNORMAL HIGH (ref 0.05–0.27)

## 2021-04-03 LAB — T4, FREE: Free T4: 0.89 ng/dL (ref 0.61–1.12)

## 2021-04-03 LAB — MRSA NEXT GEN BY PCR, NASAL: MRSA by PCR Next Gen: NOT DETECTED

## 2021-04-03 LAB — LIPASE, BLOOD: Lipase: 58 U/L — ABNORMAL HIGH (ref 11–51)

## 2021-04-03 LAB — CORTISOL-AM, BLOOD: Cortisol - AM: 21.5 ug/dL (ref 6.7–22.6)

## 2021-04-03 LAB — HEMOGLOBIN A1C
Hgb A1c MFr Bld: 5 % (ref 4.8–5.6)
Mean Plasma Glucose: 96.8 mg/dL

## 2021-04-03 LAB — MAGNESIUM
Magnesium: 2.3 mg/dL (ref 1.7–2.4)
Magnesium: 2.5 mg/dL — ABNORMAL HIGH (ref 1.7–2.4)

## 2021-04-03 LAB — VITAMIN B12: Vitamin B-12: 2869 pg/mL — ABNORMAL HIGH (ref 180–914)

## 2021-04-03 LAB — FOLATE: Folate: 28.6 ng/mL (ref >5.9)

## 2021-04-03 LAB — PHOSPHORUS
Phosphorus: >30 mg/dL — ABNORMAL HIGH (ref 2.5–4.6)
Phosphorus: >30.1 mg/dL — ABNORMAL HIGH (ref 2.5–4.6)

## 2021-04-03 LAB — TSH: TSH: 4.085 u[IU]/mL (ref 0.350–4.500)

## 2021-04-03 LAB — OSMOLALITY: Osmolality: 355 mOsm/kg (ref 275–295)

## 2021-04-03 IMAGING — DX DG CHEST 1V PORT
1 series · 1 of 1 positions shown · non-contrast
Comparison: Radiograph [DATE]

CLINICAL DATA: Post CPR.  Intubation.

EXAM:
PORTABLE CHEST 1 VIEW

[chest]
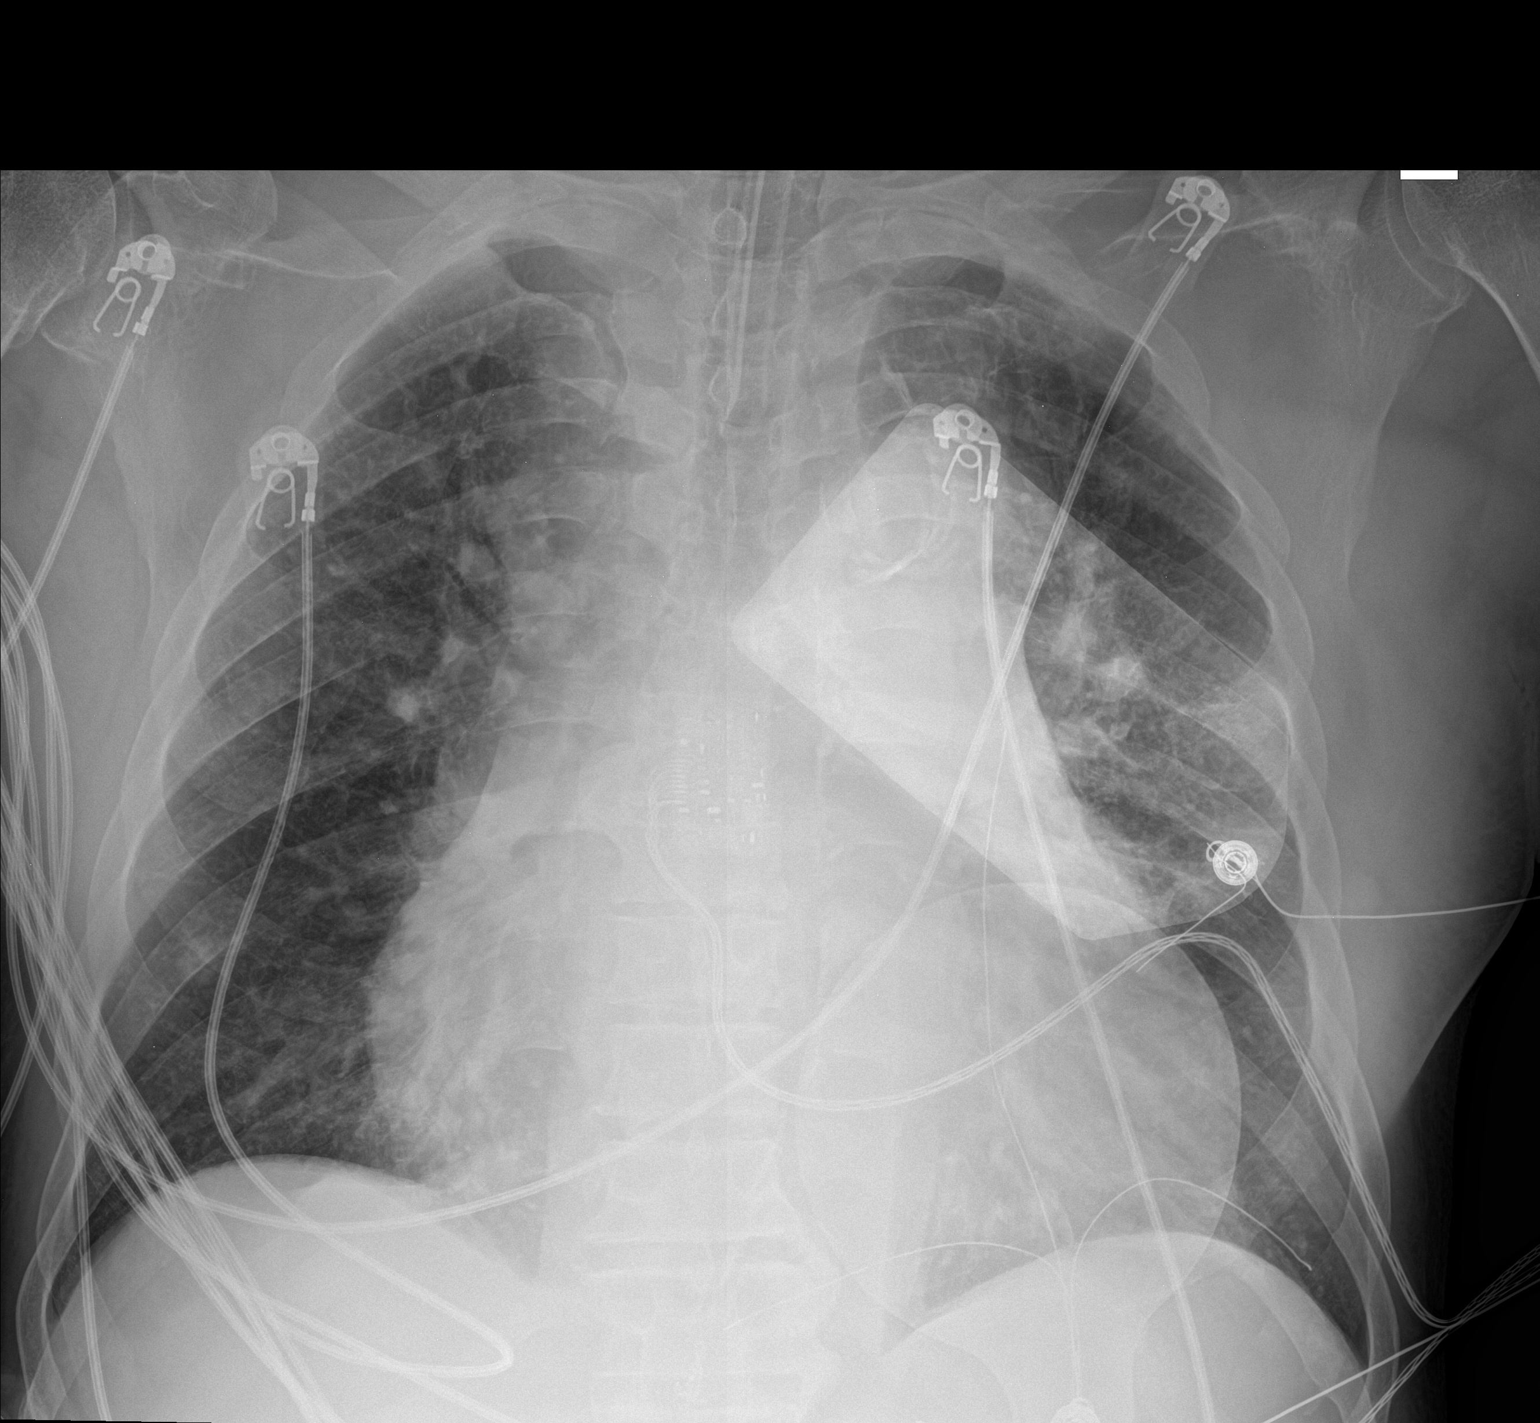

[1 of 1 positions shown; findings below may reference images not displayed]

FINDINGS: Endotracheal tube tip is 4.1 cm from the carina. Chronic
cardiomegaly. Aortic atherosclerosis. New vascular congestion. Left
perihilar opacity. No pneumothorax or large pleural effusion. No
acute osseous abnormalities are seen.
IMPRESSION: 1. Endotracheal tube tip 4.1 cm from the carina.
2. Unchanged cardiomegaly. Vascular congestion. Left perihilar
opacity may be atelectasis, pneumonia, or less likely asymmetric
edema.

## 2021-04-03 IMAGING — DX DG CHEST 1V PORT
2 series · 3 of 3 positions shown · non-contrast
Comparison: Earlier the same day.

CLINICAL DATA: 65-year-old male status post orogastric tube
placement.

EXAM:
PORTABLE CHEST - 1 VIEW

[Series 1: chest ap · 0.14mm/px · 2 of 2 slices shown (1 of 2)]
[im 1/2]
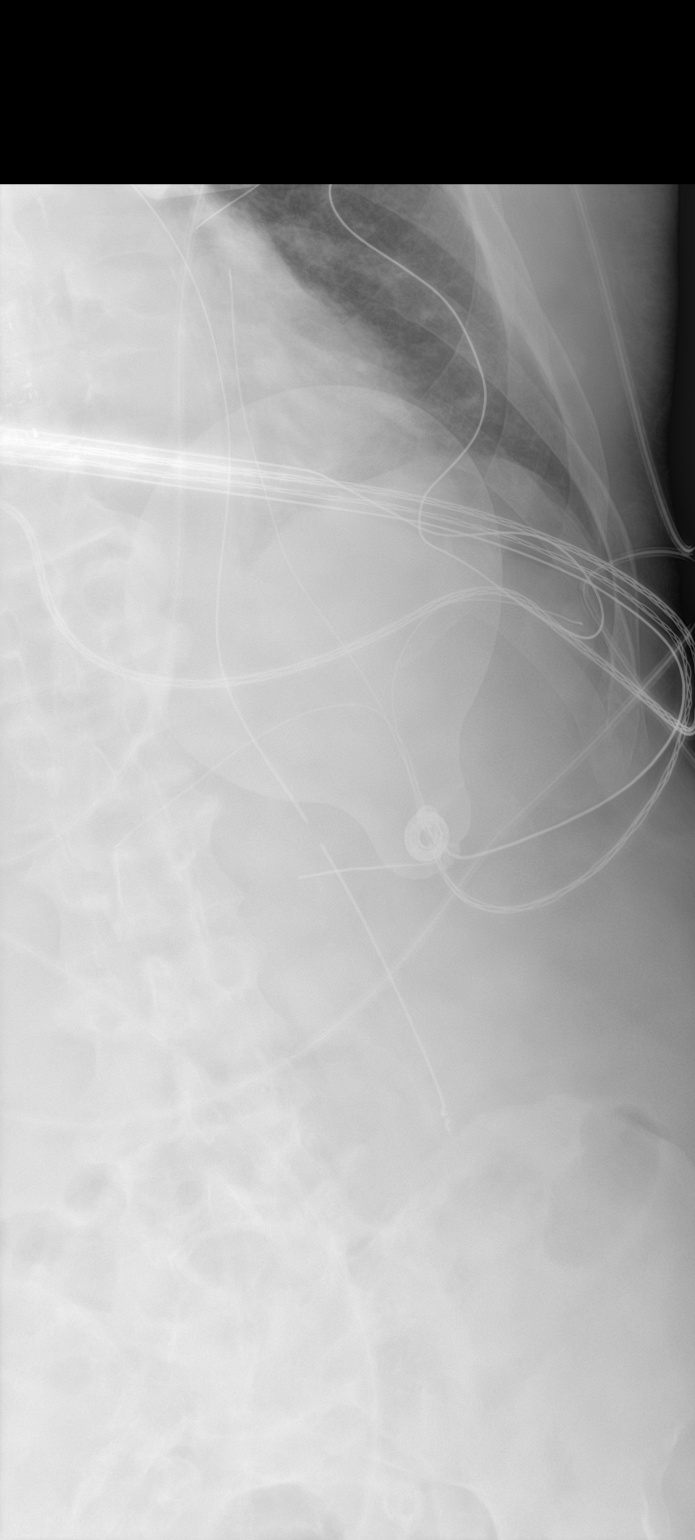
[im 2/2]
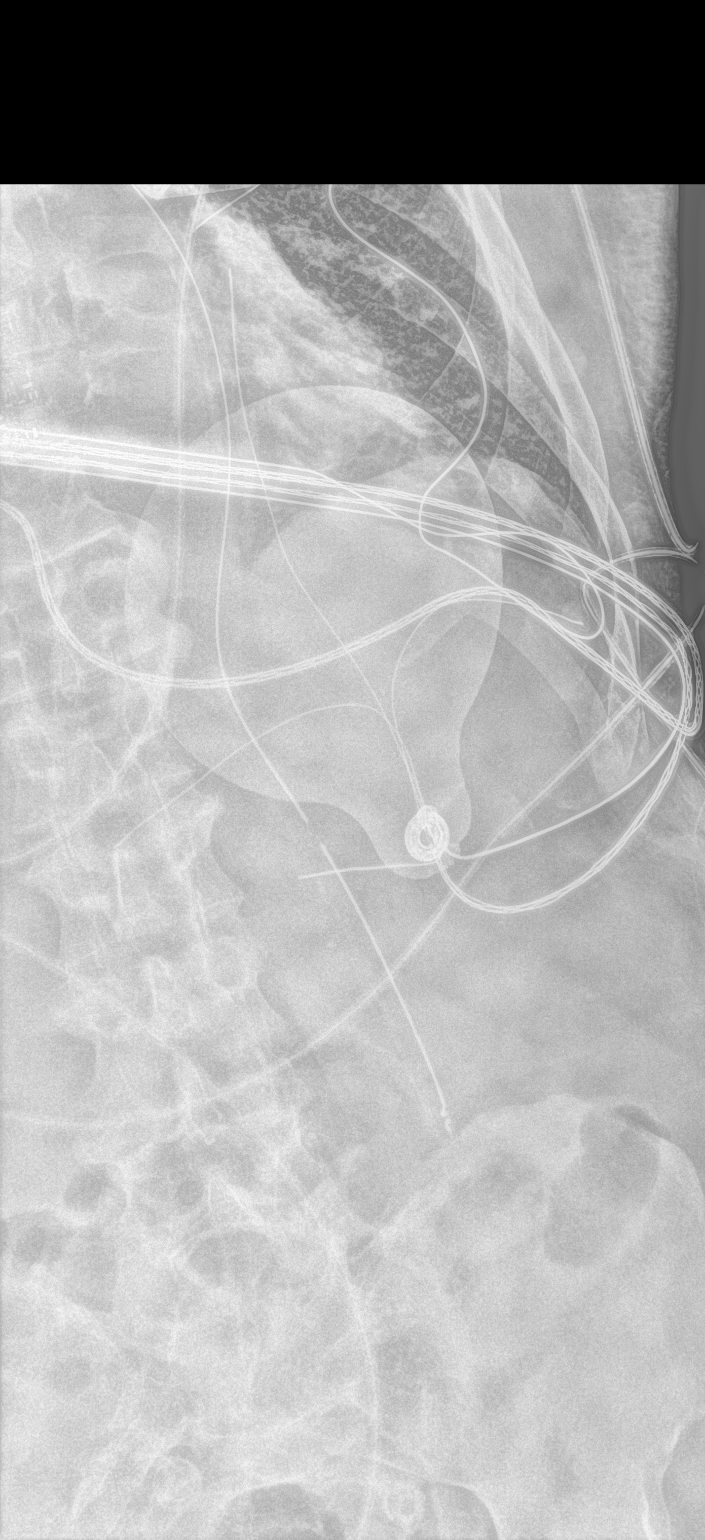

[chest ap (2 of 2)]
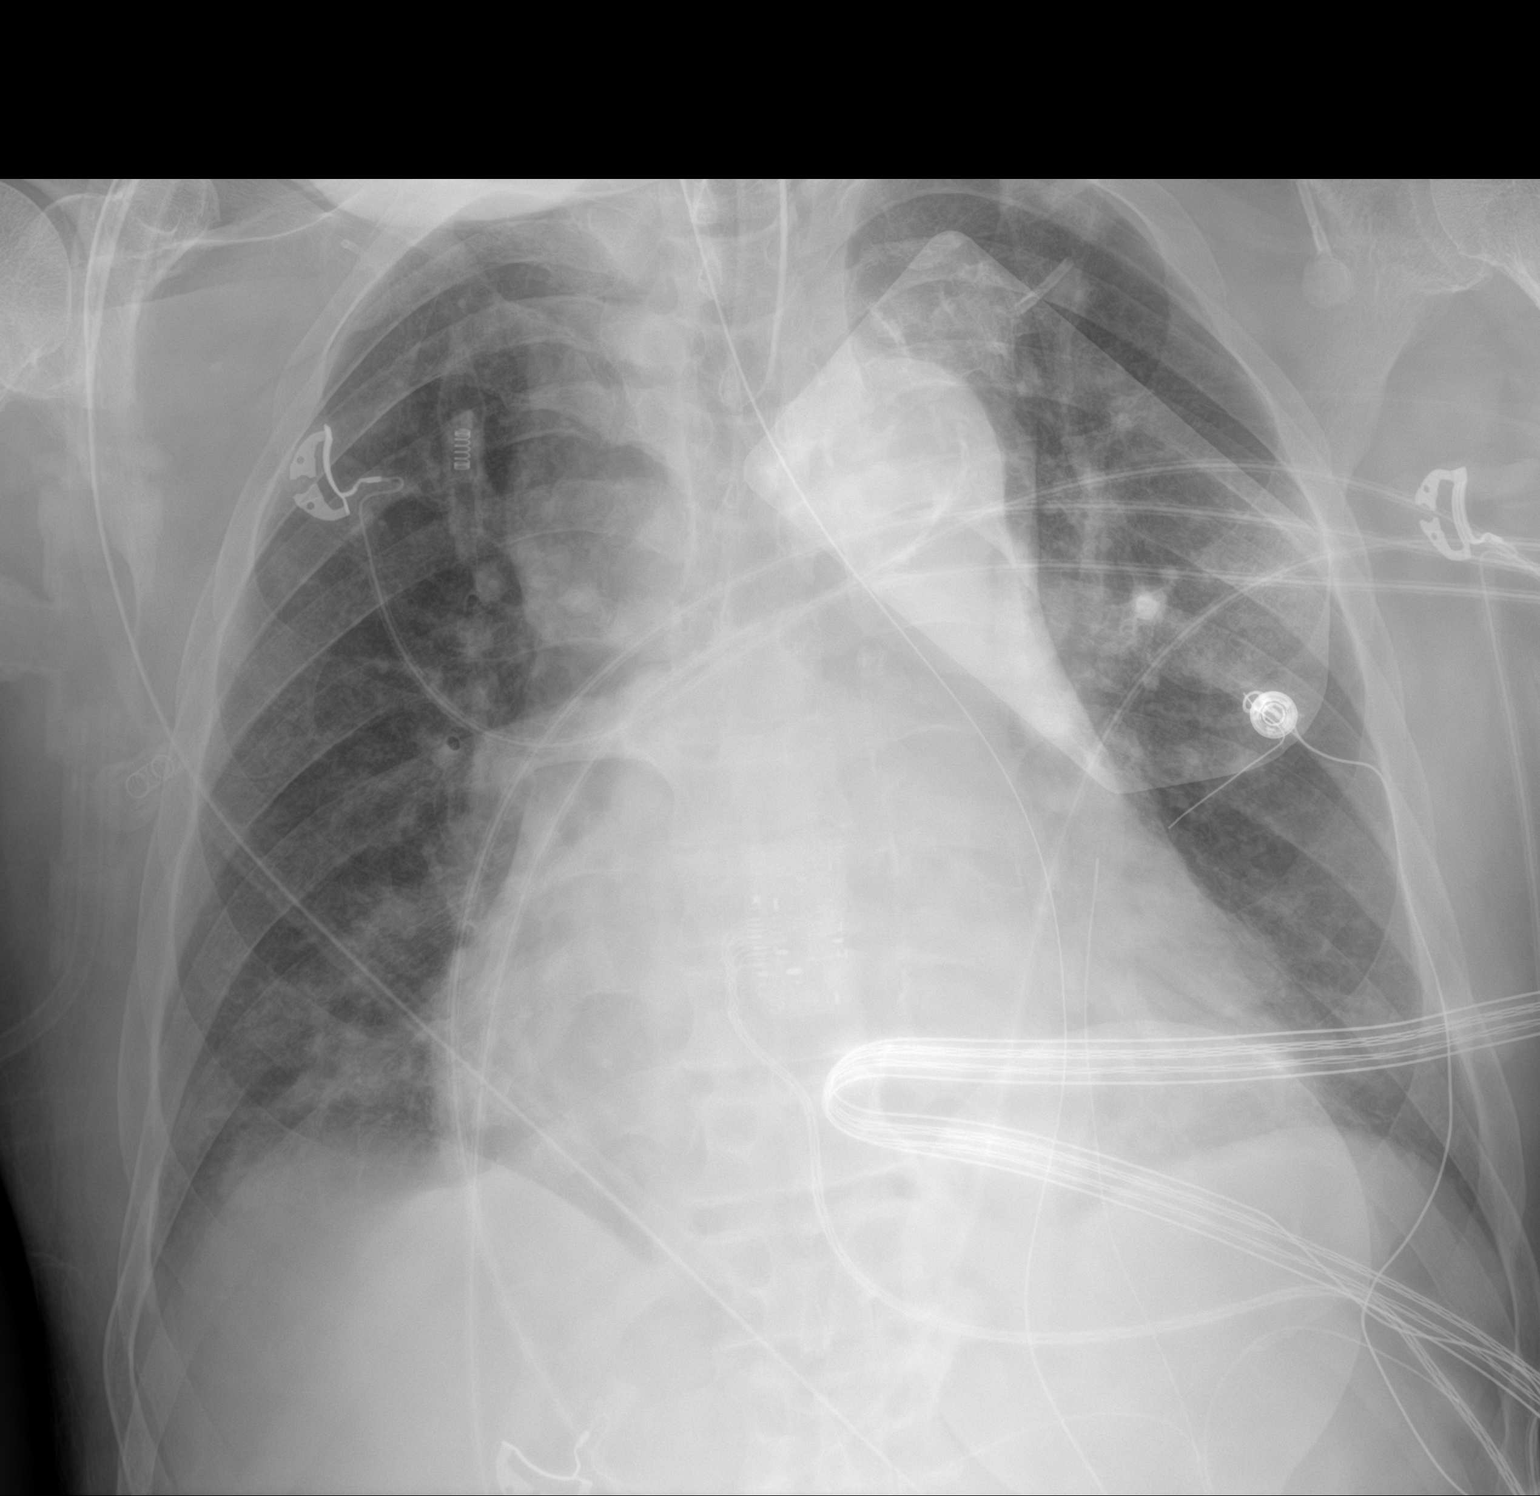

[3 of 3 positions shown; findings below may reference images not displayed]

FINDINGS: Interval insertion of gastric decompression tube with the tip in the
left upper quadrant. Unchanged position of the indwelling
endotracheal tube with the tip in the midthoracic trachea.
Defibrillator pads remain in place. The mediastinal contours are
within normal limits. Unchanged cardiomegaly. Interval increased
scattered bilateral hazy pulmonary opacities. No significant pleural
effusion or evidence of pneumothorax. No acute osseous abnormality.
IMPRESSION: 1. Interval insertion of gastric decompression tube with the tip in
the left upper quadrant, likely within the stomach.
2. Interval development of mild pulmonary edema.
3. Unchanged cardiomegaly.

## 2021-04-03 IMAGING — DX DG CHEST 1V PORT
1 series · 1 of 1 positions shown · non-contrast
Comparison: Radiograph 1 hour ago.  Chest CT [DATE]

CLINICAL DATA: Acute respiratory failure with hypercarbia.

EXAM:
PORTABLE CHEST 1 VIEW

[chest ap]
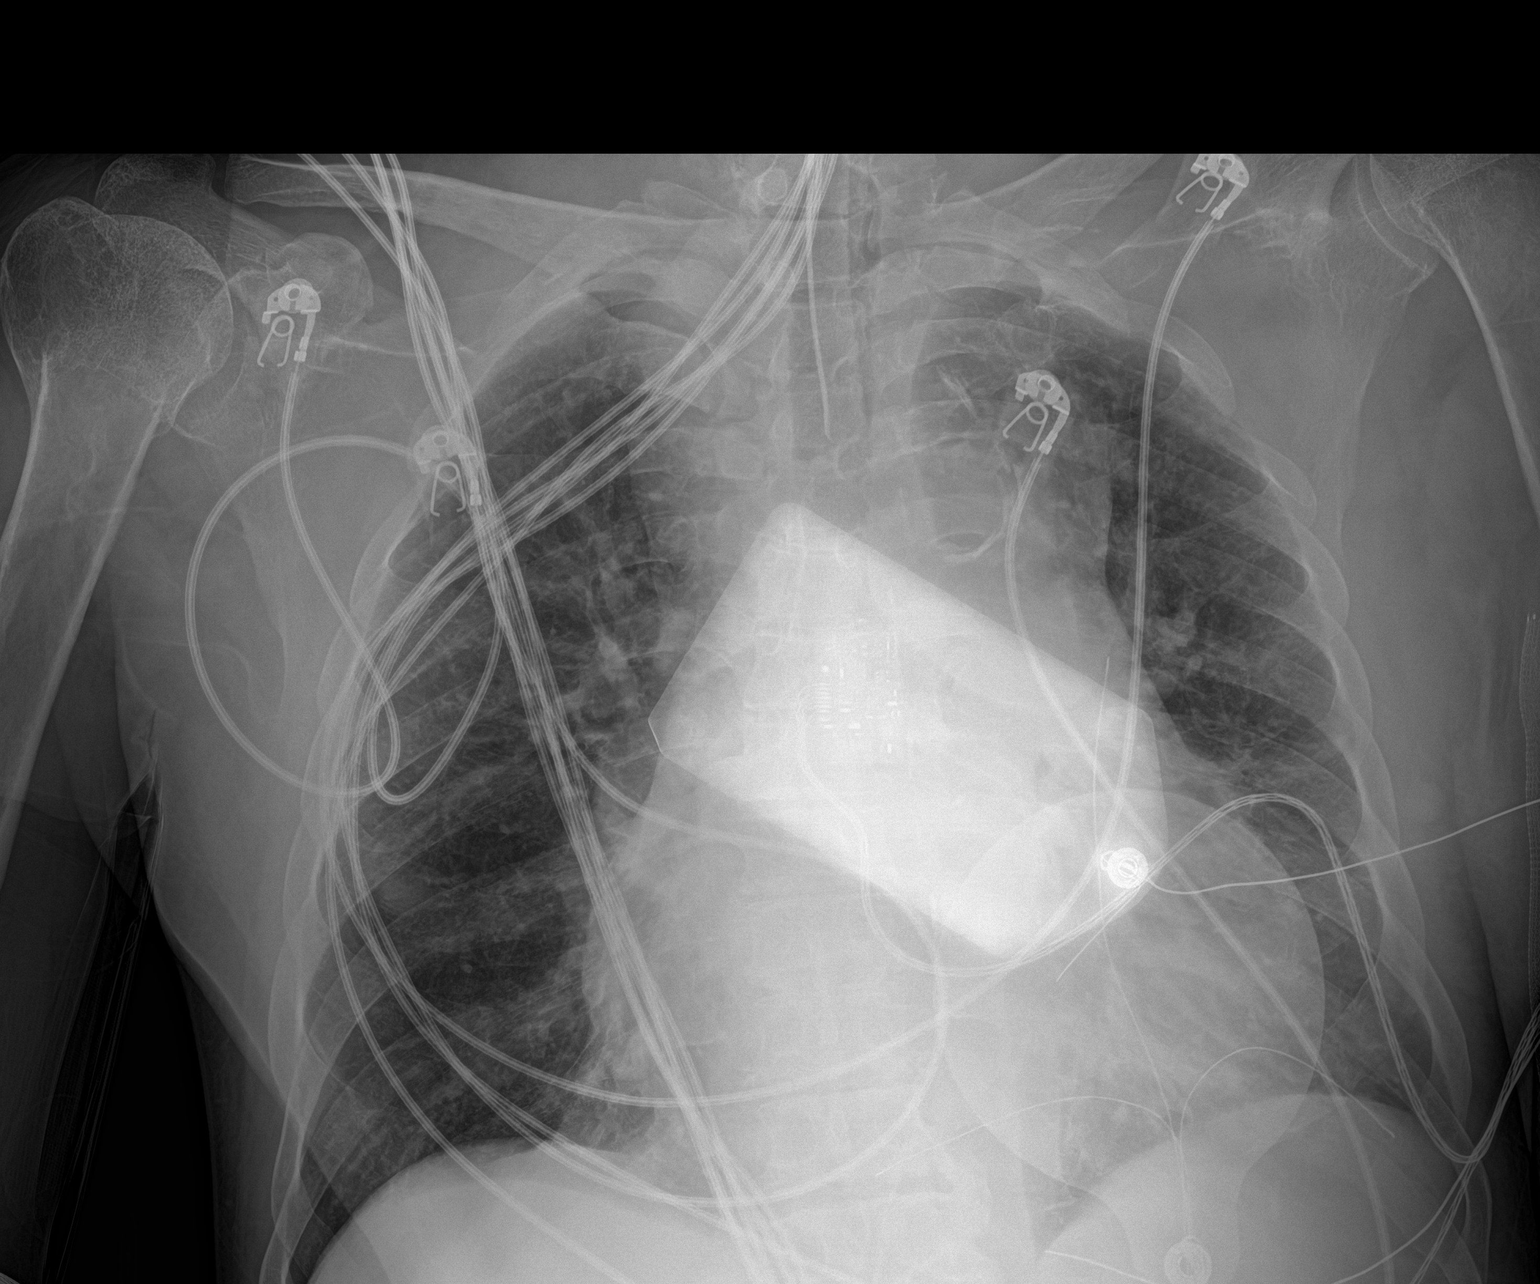

[1 of 1 positions shown; findings below may reference images not displayed]

FINDINGS: Endotracheal tube is grossly stable in positioning. Stable
cardiomegaly. Vascular congestion is improving. Left perihilar
opacities improving. No visualized pneumothorax or significant
pleural effusion. Prominence of the right paratracheal stripe
corresponds to vascular overlap on prior CT.
IMPRESSION: 1. Improving vascular congestion and left perihilar opacities.
2. Stable cardiomegaly.

## 2021-04-03 IMAGING — CT CT HEAD W/O CM
4 series · 17 of 47 positions shown, 19 images · non-contrast
Comparison: Head CT and brain MRI [DATE]

CLINICAL DATA: Altered mental status.

EXAM:
CT HEAD WITHOUT CONTRAST
TECHNIQUE: Contiguous axial images were obtained from the base of the skull
through the vertex without intravenous contrast.

[Series 2: head wo · axial · 0.49mm/px · z∈[-176,-30]mm · 7 of 39 slices shown, 9 images]
[im 5/39  brain]
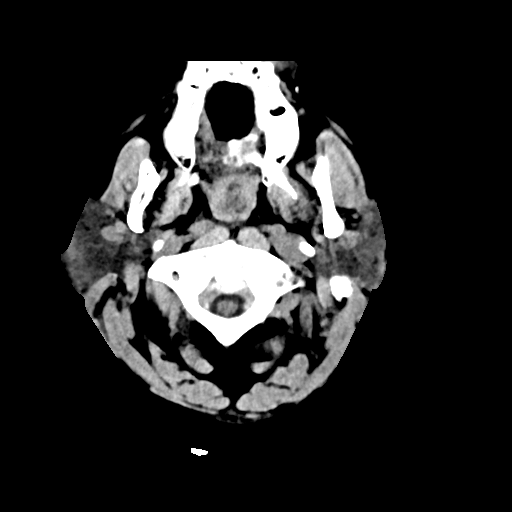
[im 5/39  bone]
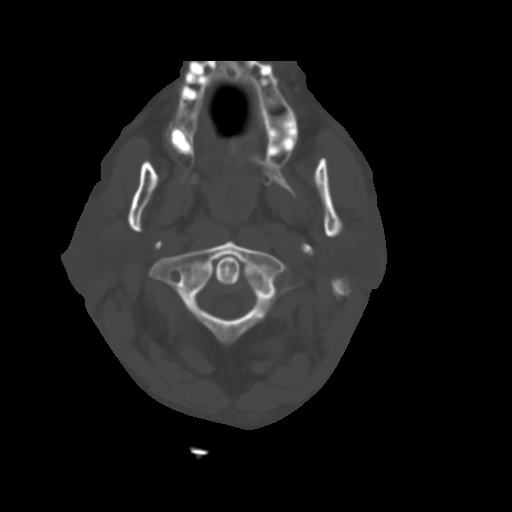
[im 10/39  brain]
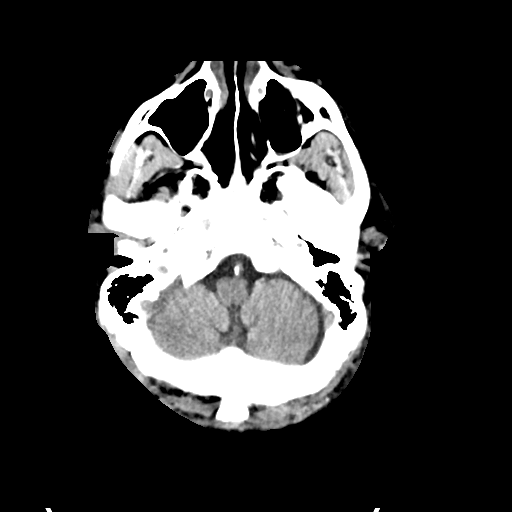
[im 15/39  brain]
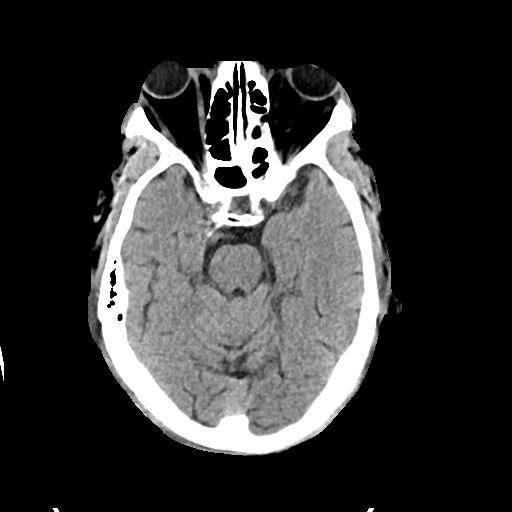
[im 20/39  brain]
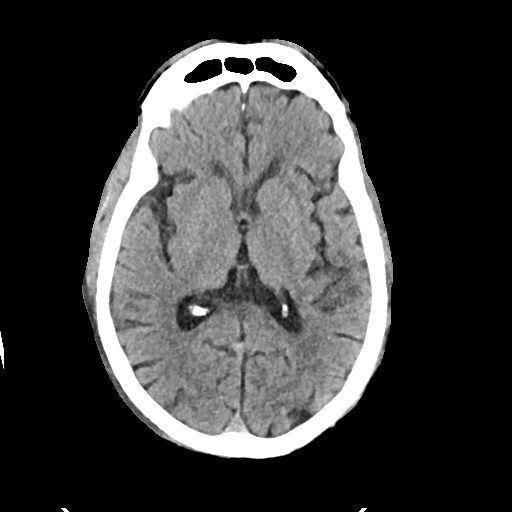
[im 24/39  brain]
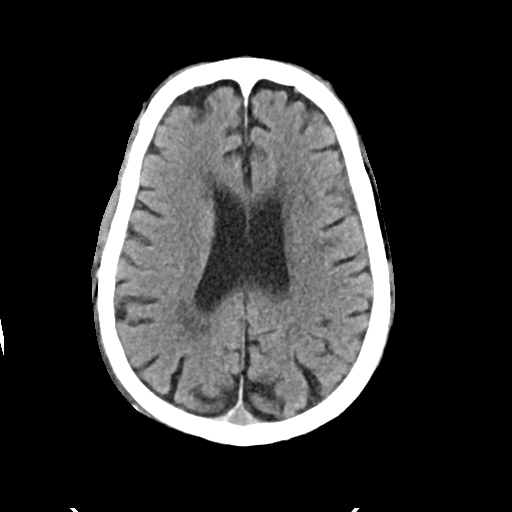
[im 24/39  bone]
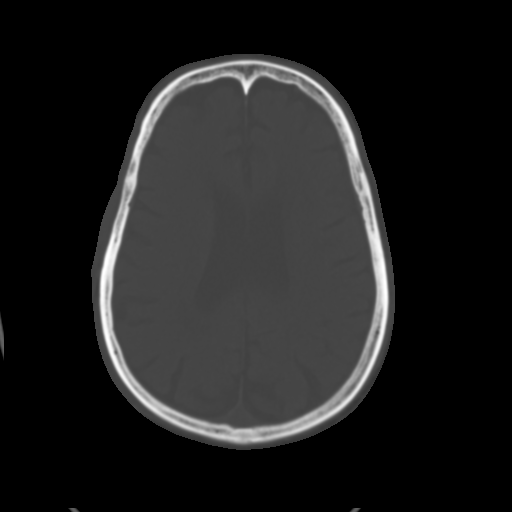
[im 29/39  brain]
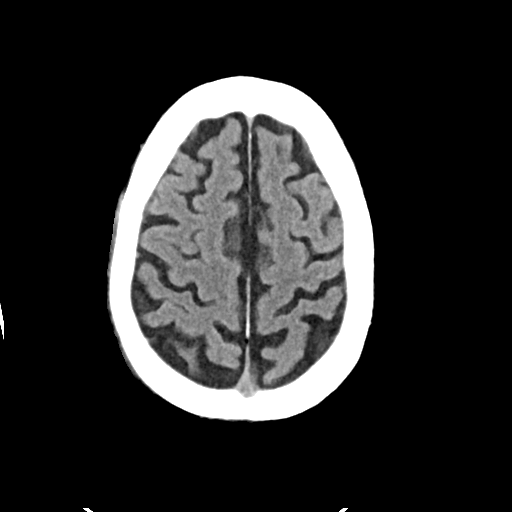
[im 34/39  brain]
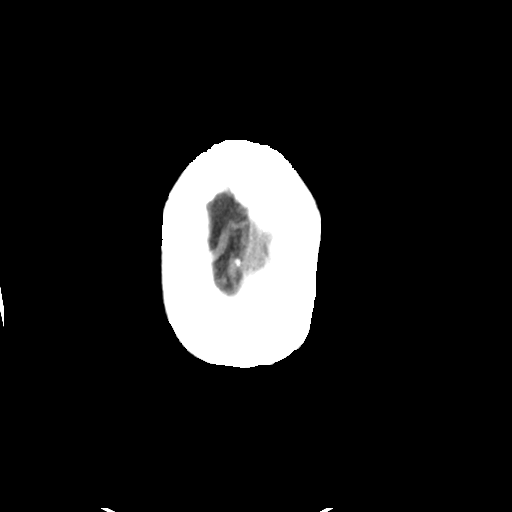

[Series 3: head bone · axial · 0.49mm/px · z∈[-178,-112]mm · 4 of 96 slices shown]
[im 10/96  bone]
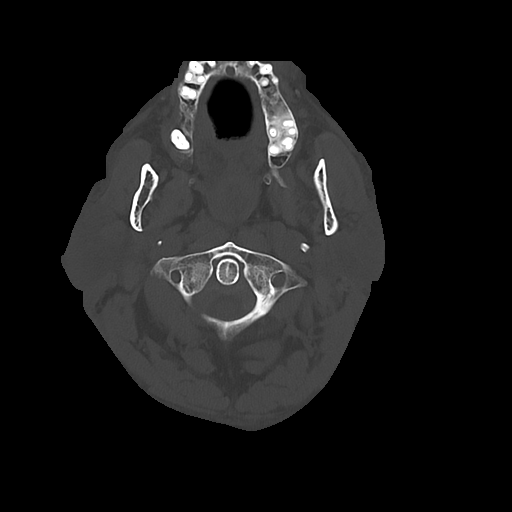
[im 20/96  bone]
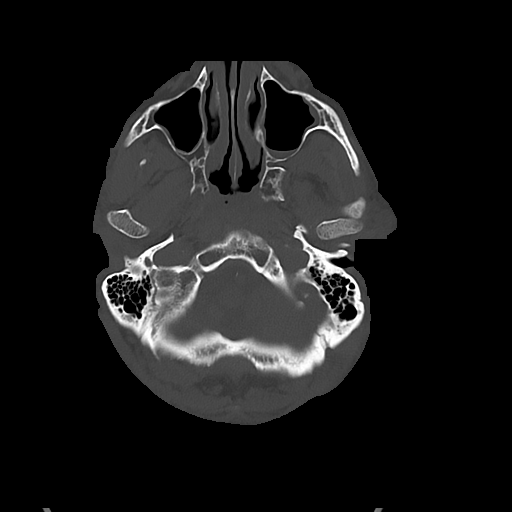
[im 29/96  bone]
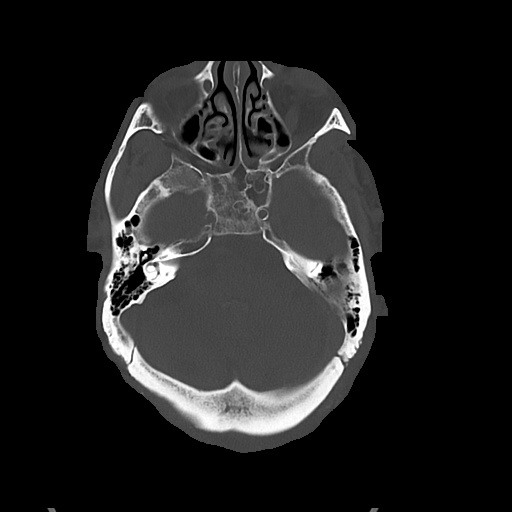
[im 43/96  bone]
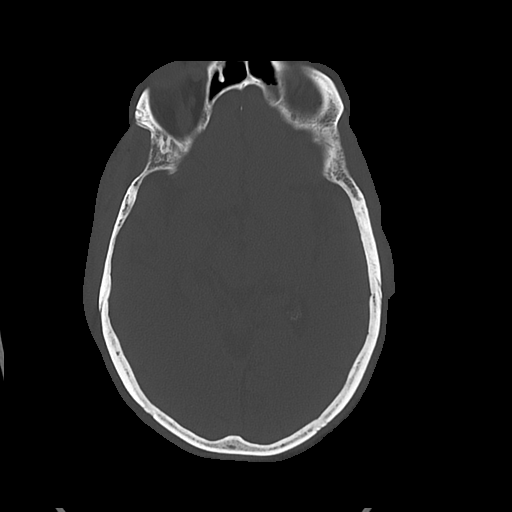

[Series 4: cor soft · coronal · 0.37mm/px · 3 of 80 slices shown]
[im 27/80  brain]
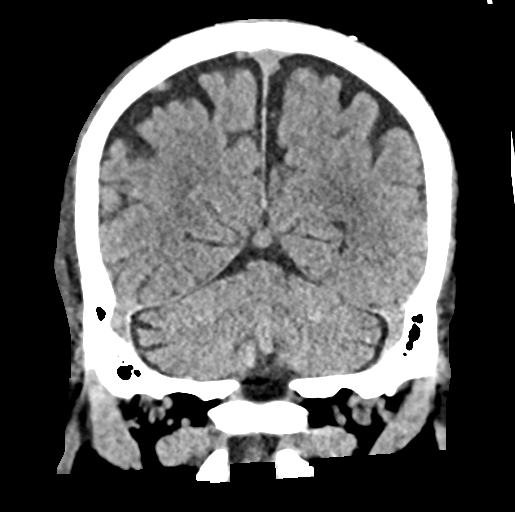
[im 36/80  brain]
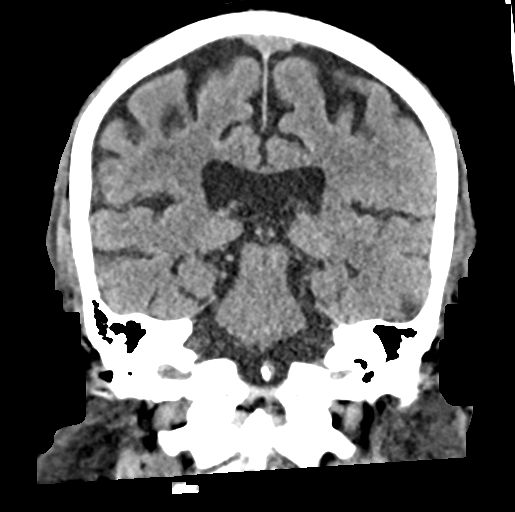
[im 44/80  brain]
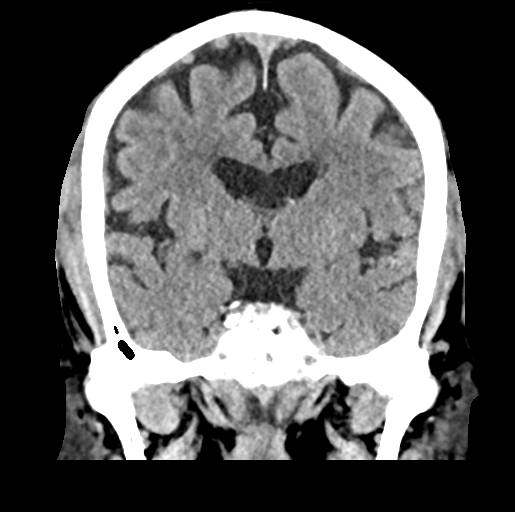

[Series 5: sag soft · sagittal · 0.37mm/px · 3 of 63 slices shown]
[im 21/63  brain]
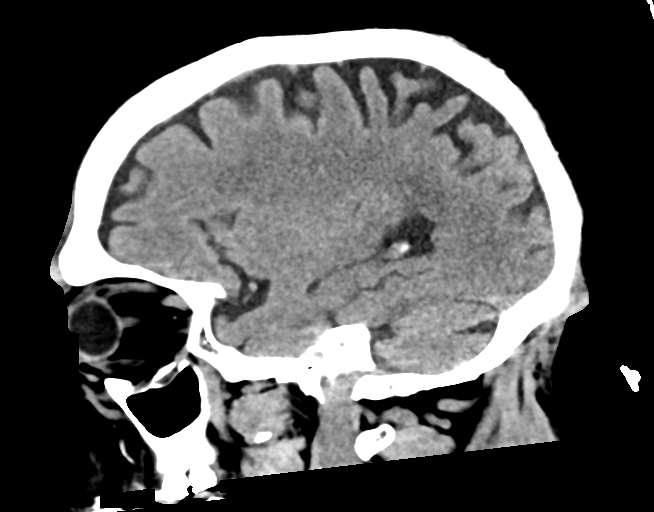
[im 32/63  brain]
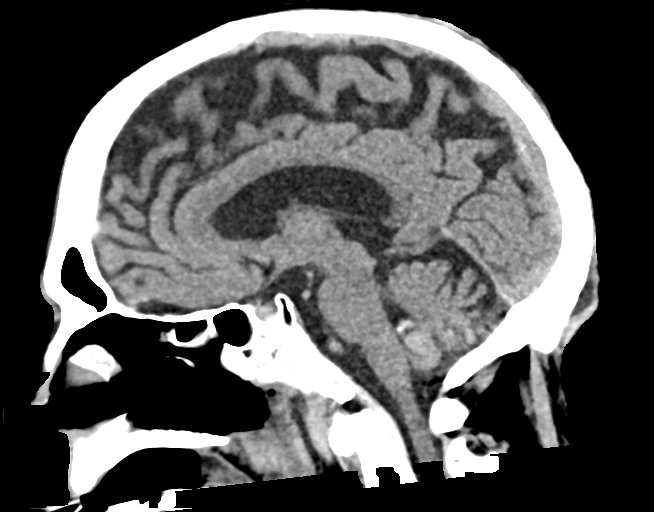
[im 42/63  brain]
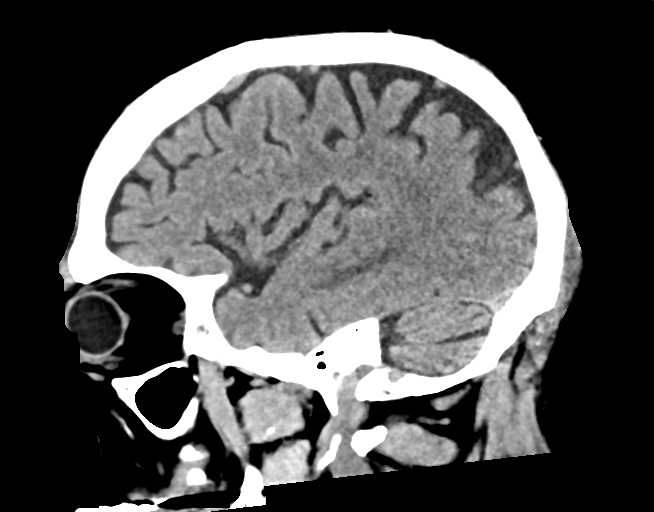

[17 of 47 positions shown; findings below may reference images not displayed]

FINDINGS: Brain: Stable degree of atrophy and chronic small vessel ischemia.
No intracranial hemorrhage, mass effect, or midline shift. No
hydrocephalus. The basilar cisterns are patent. No evidence of
territorial infarct or acute ischemia. No extra-axial or
intracranial fluid collection.

Vascular: Atherosclerosis of skullbase vasculature without
hyperdense vessel or abnormal calcification.

Skull: No fracture or focal lesion.

Sinuses/Orbits: Scattered mucosal thickening and opacification of
the paranasal sinuses. No mastoid effusion. Patient is intubated.

Other: None.
IMPRESSION: 1. No acute intracranial abnormality.
2. Stable atrophy and chronic small vessel ischemia.

## 2021-04-03 MED ORDER — POLYETHYLENE GLYCOL 3350 17 G PO PACK: 17.0000 g | PACK | Freq: Every day | ORAL | Status: DC

## 2021-04-03 MED ORDER — EPINEPHRINE 1 MG/10ML IJ SOSY
PREFILLED_SYRINGE | INTRAMUSCULAR | Status: DC | PRN
  Administered 2021-04-03: 1 mg via INTRAVENOUS

## 2021-04-03 MED ORDER — FENTANYL CITRATE PF 50 MCG/ML IJ SOSY: 50.0000 ug | PREFILLED_SYRINGE | INTRAMUSCULAR | Status: DC | PRN

## 2021-04-03 MED ORDER — SODIUM CHLORIDE 0.9 % IV SOLN
INTRAVENOUS | Status: DC | PRN
  Administered 2021-04-03: 250 mL via INTRAVENOUS

## 2021-04-03 MED ORDER — POLYETHYLENE GLYCOL 3350 17 G PO PACK
17.0000 g | PACK | Freq: Every day | ORAL | Status: DC
  Administered 2021-04-04: 17 g
  Filled 2021-04-03: qty 1

## 2021-04-03 MED ORDER — FENTANYL CITRATE PF 50 MCG/ML IJ SOSY
50.0000 ug | PREFILLED_SYRINGE | INTRAMUSCULAR | Status: DC | PRN
Start: 2021-04-03 — End: 2021-04-03

## 2021-04-03 MED ORDER — ROCURONIUM BROMIDE 50 MG/5ML IV SOLN
INTRAVENOUS | Status: DC | PRN
  Administered 2021-04-03: 100 mg via INTRAVENOUS

## 2021-04-03 MED ORDER — ETOMIDATE 2 MG/ML IV SOLN
INTRAVENOUS | Status: DC | PRN
  Administered 2021-04-03: 20 mg via INTRAVENOUS

## 2021-04-03 MED ORDER — SODIUM CHLORIDE 0.9 % IV BOLUS
500.0000 mL | Freq: Once | INTRAVENOUS | Status: AC
  Administered 2021-04-03: 500 mL via INTRAVENOUS

## 2021-04-03 MED ORDER — INSULIN ASPART 100 UNIT/ML IJ SOLN: 0.0000 [IU] | INTRAMUSCULAR | Status: DC

## 2021-04-03 MED ORDER — ALTEPLASE 2 MG IJ SOLR
2.0000 mg | Freq: Once | INTRAMUSCULAR | Status: DC | PRN
  Filled 2021-04-03: qty 2

## 2021-04-03 MED ORDER — LEVETIRACETAM IN NACL 500 MG/100ML IV SOLN: 500.0000 mg | Freq: Two times a day (BID) | INTRAVENOUS | Status: DC

## 2021-04-03 MED ORDER — VITAL HIGH PROTEIN PO LIQD
1000.0000 mL | ORAL | Status: DC
  Administered 2021-04-03: 1000 mL

## 2021-04-03 MED ORDER — MIDAZOLAM HCL 2 MG/2ML IJ SOLN: 2.0000 mg | INTRAMUSCULAR | Status: DC | PRN

## 2021-04-03 MED ORDER — ALBUTEROL SULFATE (2.5 MG/3ML) 0.083% IN NEBU
7.5000 mg | INHALATION_SOLUTION | Freq: Once | RESPIRATORY_TRACT | Status: AC
  Filled 2021-04-03: qty 9

## 2021-04-03 MED ORDER — FENTANYL CITRATE (PF) 100 MCG/2ML IJ SOLN: 25.0000 ug | INTRAMUSCULAR | Status: DC | PRN

## 2021-04-03 MED ORDER — TBO-FILGRASTIM 300 MCG/0.5ML ~~LOC~~ SOSY
300.0000 ug | PREFILLED_SYRINGE | Freq: Every day | SUBCUTANEOUS | Status: DC
  Filled 2021-04-03: qty 0.5

## 2021-04-03 MED ORDER — MIDAZOLAM HCL 2 MG/2ML IJ SOLN: 1.0000 mg | INTRAMUSCULAR | Status: DC | PRN

## 2021-04-03 MED ORDER — MIDAZOLAM HCL 2 MG/2ML IJ SOLN
1.0000 mg | INTRAMUSCULAR | Status: DC | PRN
Start: 2021-04-03 — End: 2021-04-03

## 2021-04-03 MED ORDER — SODIUM CHLORIDE 0.9 % IV SOLN: 100.0000 mL | INTRAVENOUS | Status: DC | PRN

## 2021-04-03 MED ORDER — IPRATROPIUM-ALBUTEROL 0.5-2.5 (3) MG/3ML IN SOLN
3.0000 mL | Freq: Four times a day (QID) | RESPIRATORY_TRACT | Status: DC
  Administered 2021-04-03 – 2021-04-04 (×7): 3 mL via RESPIRATORY_TRACT
  Filled 2021-04-03 (×7): qty 3

## 2021-04-03 MED ORDER — ALBUTEROL SULFATE (2.5 MG/3ML) 0.083% IN NEBU
INHALATION_SOLUTION | RESPIRATORY_TRACT | Status: AC
  Administered 2021-04-03: 7.5 mg via RESPIRATORY_TRACT
  Filled 2021-04-03: qty 6

## 2021-04-03 MED ORDER — CALCIUM CHLORIDE 10 % IV SOLN
INTRAVENOUS | Status: DC | PRN
  Administered 2021-04-03: 1 g via INTRAVENOUS

## 2021-04-03 MED ORDER — LEVETIRACETAM IN NACL 1000 MG/100ML IV SOLN
1000.0000 mg | Freq: Once | INTRAVENOUS | Status: AC
  Administered 2021-04-03: 1000 mg via INTRAVENOUS
  Filled 2021-04-03: qty 100

## 2021-04-03 MED ORDER — FOLIC ACID 1 MG PO TABS
1.0000 mg | ORAL_TABLET | Freq: Every day | ORAL | Status: DC
  Administered 2021-04-03 – 2021-04-04 (×2): 1 mg
  Filled 2021-04-03 (×2): qty 1

## 2021-04-03 MED ORDER — PROSOURCE TF PO LIQD
90.0000 mL | Freq: Three times a day (TID) | ORAL | Status: DC
  Administered 2021-04-03 – 2021-04-04 (×4): 90 mL
  Filled 2021-04-03 (×4): qty 90

## 2021-04-03 MED ORDER — CHLORHEXIDINE GLUCONATE 0.12% ORAL RINSE (MEDLINE KIT)
15.0000 mL | Freq: Two times a day (BID) | OROMUCOSAL | Status: DC
  Administered 2021-04-03 – 2021-04-08 (×9): 15 mL via OROMUCOSAL

## 2021-04-03 MED ORDER — DEXTROSE 10 % IV SOLN
INTRAVENOUS | Status: DC
  Filled 2021-04-03 (×2): qty 1000

## 2021-04-03 MED ORDER — BUDESONIDE 0.25 MG/2ML IN SUSP
0.2500 mg | Freq: Two times a day (BID) | RESPIRATORY_TRACT | Status: DC
  Administered 2021-04-03 – 2021-04-15 (×21): 0.25 mg via RESPIRATORY_TRACT
  Filled 2021-04-03 (×24): qty 2

## 2021-04-03 MED ORDER — HEPARIN SODIUM (PORCINE) 5000 UNIT/ML IJ SOLN
5000.0000 [IU] | Freq: Three times a day (TID) | INTRAMUSCULAR | Status: DC
  Administered 2021-04-03 – 2021-04-07 (×12): 5000 [IU] via SUBCUTANEOUS
  Filled 2021-04-03 (×11): qty 1

## 2021-04-03 MED ORDER — LIDOCAINE HCL (PF) 1 % IJ SOLN: 5.0000 mL | INTRAMUSCULAR | Status: DC | PRN

## 2021-04-03 MED ORDER — PROPOFOL 1000 MG/100ML IV EMUL
0.0000 ug/kg/min | INTRAVENOUS | Status: DC
Start: 2021-04-03 — End: 2021-04-04
  Administered 2021-04-03: 30 ug/kg/min via INTRAVENOUS
  Administered 2021-04-03: 5 ug/kg/min via INTRAVENOUS
  Administered 2021-04-04: 20 ug/kg/min via INTRAVENOUS
  Filled 2021-04-03 (×3): qty 100

## 2021-04-03 MED ORDER — SODIUM CHLORIDE 0.9 % IV SOLN
250.0000 mg | Freq: Two times a day (BID) | INTRAVENOUS | Status: DC
  Administered 2021-04-03 – 2021-04-04 (×2): 250 mg via INTRAVENOUS
  Filled 2021-04-03 (×5): qty 2.5

## 2021-04-03 MED ORDER — ORAL CARE MOUTH RINSE
15.0000 mL | OROMUCOSAL | Status: DC
  Administered 2021-04-03 – 2021-04-04 (×11): 15 mL via OROMUCOSAL

## 2021-04-03 MED ORDER — DOCUSATE SODIUM 100 MG PO CAPS: 100.0000 mg | ORAL_CAPSULE | Freq: Two times a day (BID) | ORAL | Status: DC | PRN

## 2021-04-03 MED ORDER — PENTAFLUOROPROP-TETRAFLUOROETH EX AERO: 1.0000 "application " | INHALATION_SPRAY | CUTANEOUS | Status: DC | PRN

## 2021-04-03 MED ORDER — THIAMINE HCL 100 MG PO TABS
100.0000 mg | ORAL_TABLET | Freq: Every day | ORAL | Status: DC
  Administered 2021-04-03 – 2021-04-04 (×2): 100 mg
  Filled 2021-04-03 (×2): qty 1

## 2021-04-03 MED ORDER — HYDRALAZINE HCL 20 MG/ML IJ SOLN
10.0000 mg | INTRAMUSCULAR | Status: DC | PRN
  Administered 2021-04-03 – 2021-04-11 (×5): 10 mg via INTRAVENOUS
  Filled 2021-04-03 (×5): qty 1

## 2021-04-03 MED ORDER — SODIUM BICARBONATE 8.4 % IV SOLN
INTRAVENOUS | Status: DC
  Filled 2021-04-03: qty 1000

## 2021-04-03 MED ORDER — DEXTROSE 50 % IV SOLN
INTRAVENOUS | Status: AC
  Filled 2021-04-03: qty 50

## 2021-04-03 MED ORDER — DOCUSATE SODIUM 50 MG/5ML PO LIQD: 100.0000 mg | Freq: Two times a day (BID) | ORAL | Status: DC

## 2021-04-03 MED ORDER — LIDOCAINE-PRILOCAINE 2.5-2.5 % EX CREA
1.0000 "application " | TOPICAL_CREAM | CUTANEOUS | Status: DC | PRN
  Filled 2021-04-03: qty 5

## 2021-04-03 MED ORDER — SODIUM BICARBONATE 8.4 % IV SOLN
INTRAVENOUS | Status: DC | PRN
  Administered 2021-04-03 (×2): 50 meq via INTRAVENOUS

## 2021-04-03 MED ORDER — PANTOPRAZOLE SODIUM 40 MG IV SOLR
40.0000 mg | Freq: Every day | INTRAVENOUS | Status: DC
  Administered 2021-04-03 – 2021-04-04 (×3): 40 mg via INTRAVENOUS
  Filled 2021-04-03 (×3): qty 40

## 2021-04-03 MED ORDER — POLYETHYLENE GLYCOL 3350 17 G PO PACK: 17.0000 g | PACK | Freq: Every day | ORAL | Status: DC | PRN

## 2021-04-03 MED ORDER — SODIUM CHLORIDE 0.9 % IV SOLN: INTRAVENOUS | Status: DC | PRN

## 2021-04-03 MED ORDER — FENTANYL CITRATE (PF) 100 MCG/2ML IJ SOLN
25.0000 ug | INTRAMUSCULAR | Status: DC | PRN
  Administered 2021-04-03: 100 ug via INTRAVENOUS
  Administered 2021-04-03: 50 ug via INTRAVENOUS
  Filled 2021-04-03 (×3): qty 2

## 2021-04-03 MED ORDER — DOCUSATE SODIUM 50 MG/5ML PO LIQD
100.0000 mg | Freq: Two times a day (BID) | ORAL | Status: DC
  Administered 2021-04-03 – 2021-04-04 (×2): 100 mg
  Filled 2021-04-03 (×2): qty 10

## 2021-04-03 MED ORDER — HEPARIN SODIUM (PORCINE) 1000 UNIT/ML DIALYSIS: 1000.0000 [IU] | INTRAMUSCULAR | Status: DC | PRN

## 2021-04-03 MED ORDER — CHLORHEXIDINE GLUCONATE CLOTH 2 % EX PADS
6.0000 | MEDICATED_PAD | Freq: Every day | CUTANEOUS | Status: DC
  Administered 2021-04-03 – 2021-04-10 (×7): 6 via TOPICAL

## 2021-04-03 MED ORDER — PIPERACILLIN-TAZOBACTAM IN DEX 2-0.25 GM/50ML IV SOLN
2.2500 g | Freq: Three times a day (TID) | INTRAVENOUS | Status: DC
  Administered 2021-04-03 – 2021-04-05 (×8): 2.25 g via INTRAVENOUS
  Filled 2021-04-03 (×10): qty 50

## 2021-04-03 MED ORDER — INSULIN ASPART 100 UNIT/ML IJ SOLN
0.0000 [IU] | INTRAMUSCULAR | Status: DC
  Administered 2021-04-03: 4 [IU] via SUBCUTANEOUS
  Administered 2021-04-03 (×2): 3 [IU] via SUBCUTANEOUS
  Administered 2021-04-03: 1 [IU] via SUBCUTANEOUS
  Administered 2021-04-03: 2 [IU] via SUBCUTANEOUS

## 2021-04-03 NOTE — Progress Notes (Signed)
NAME:  Raymond [name] MRN:  [id number] DOB:  03-12-1956 LOS: 0 ADMISSION DATE:  04/03/2021 DATE OF SERVICE:  04/03/2021  CHIEF COMPLAINT:  cardiac arrest in the field   HISTORY & PHYSICAL  History of Present Illness  This 65 y.o. African American male smoker presented to the [facility] Emergency Department via EMS with complaints of altered mental status.  A well-check request was called in by a family member, and EMS apparently found the patient to be unresponsive and hypoglycemic (22).  He subsequently experienced cardiac arrest in the field.  CPR was initiated with ROSC achieved after 20 min of CPR/ACLS efforts per verbal report (no further details at this time).  The patient was also noted to be hypothermic (rectal probe unable to register temp).  In the ER, the patient had [facility] airway exchanged to endotracheal tube (with RSI medications).  He was started on [employer] [facility] [employer].  Monitor showed junctional bradycardia.  PCCM asked to admit.  REVIEW OF SYSTEMS This patient is critically ill and cannot provide additional history nor review of systems due to mental status/unconsciousness and endotracheally intubated.   Past Medical/Surgical/Social/Family History   Past Medical History:  Diagnosis Date   Anemia    CAD (coronary artery disease)    a. 03/2018: cath showing 80 to 30% stenosis along the LAD, luminal irregularities along the RCA, and angiographically normal LCx   COPD (chronic obstructive pulmonary disease) (HCC)    ESRD (end stage renal disease) on dialysis ([place])    "MWF; [facility]" (07/22/17)   Hepatitis C    Still positive s/p liver biopsy at [facility]  and interferon therapy for 6 months. Most recent lab work was on 10/24/12   Hepatitis C    "took the tx; gone now" (12/05/2016)  Was treated   History of blood transfusion ~ 2012/2013   "related to my kidneys; blood was low"   Hypertension    Peripheral arterial disease ([place])    Substance abuse ([place])    Thyroid  disease    Type 2 diabetes mellitus with other diabetic kidney complication ([place]) 8/67/6195    Past Surgical History:  Procedure Laterality Date   ABDOMINAL AORTOGRAM W/LOWER EXTREMITY N/A 02/08/2018   Procedure: ABDOMINAL AORTOGRAM W/LOWER EXTREMITY;  Surgeon: Conrad [place], MD;  Location: [place] CV LAB;  Service: Cardiovascular;  Laterality: N/A;   ABDOMINAL AORTOGRAM W/LOWER EXTREMITY Bilateral 06/15/2020   Procedure: ABDOMINAL AORTOGRAM W/LOWER EXTREMITY;  Surgeon: Waynetta Sandy, MD;  Location: [place] CV LAB;  Service: Cardiovascular;  Laterality: Bilateral;   AV FISTULA PLACEMENT Left Aug. 2013 ?   BIOPSY  01/17/2020   Procedure: BIOPSY;  Surgeon: Jackquline Denmark, MD;  Location: [place];  Service: Endoscopy;;   COLONOSCOPY WITH PROPOFOL N/A 08/21/2018   Procedure: COLONOSCOPY WITH PROPOFOL;  Surgeon: Yetta Flock, MD;  Location: [place];  Service: Gastroenterology;  Laterality: N/A;   ENTEROSCOPY N/A 01/17/2020   Procedure: ENTEROSCOPY;  Surgeon: Jackquline Denmark, MD;  Location[facility] ENDOSCOPY;  Service: Endoscopy;  Laterality: N/A;   ENTEROSCOPY N/A 07/31/2020   Procedure: ENTEROSCOPY;  Surgeon: Doran Stabler, MD;  Location: [place];  Service: Gastroenterology;  Laterality: N/A;   ENTEROSCOPY N/A 01/27/2021   Procedure: ENTEROSCOPY;  Surgeon: Yetta Flock, MD;  Location: [facility] ENDOSCOPY;  Service: Gastroenterology;  Laterality: N/A;   ESOPHAGOGASTRODUODENOSCOPY (EGD) WITH PROPOFOL N/A 08/20/2018   Procedure: ESOPHAGOGASTRODUODENOSCOPY (EGD) WITH PROPOFOL;  Surgeon: Gatha Mayer, MD;  Location: [place];  Service: Endoscopy;  Laterality: N/A;   FEMORAL-POPLITEAL BYPASS GRAFT Left 04/11/2018   Procedure: BYPASS GRAFT FEMORAL-POPLITEAL ARTERY LEFT LEG;  Surgeon: Rosetta Posner, MD;  Location: [facility] OR;  Service: Vascular;  Laterality: Left;   GIVENS CAPSULE STUDY N/A 07/31/2020   Procedure: GIVENS CAPSULE STUDY;  Surgeon: Doran Stabler, MD;   Location: [place];  Service: Gastroenterology;  Laterality: N/A;   HOT HEMOSTASIS N/A 08/20/2018   Procedure: HOT HEMOSTASIS (ARGON PLASMA COAGULATION/BICAP);  Surgeon: Gatha Mayer, MD;  Location: [facility] ENDOSCOPY;  Service: Endoscopy;  Laterality: N/A;   HOT HEMOSTASIS N/A 01/17/2020   Procedure: HOT HEMOSTASIS (ARGON PLASMA COAGULATION/BICAP);  Surgeon: Jackquline Denmark, MD;  Location: [facility] ENDOSCOPY;  Service: Endoscopy;  Laterality: N/A;   HOT HEMOSTASIS N/A 01/27/2021   Procedure: HOT HEMOSTASIS (ARGON PLASMA COAGULATION/BICAP);  Surgeon: Yetta Flock, MD;  Location: [facility] ENDOSCOPY;  Service: Gastroenterology;  Laterality: N/A;   LEFT HEART CATH AND CORONARY ANGIOGRAPHY N/A 03/29/2018   Procedure: LEFT HEART CATH AND CORONARY ANGIOGRAPHY;  Surgeon: Nelva Bush, MD;  Location: [place] CV LAB;  Service: Cardiovascular;  Laterality: N/A;   LIVER BIOPSY     ORIF ULNAR FRACTURE Left 05/18/2017   Procedure: OPEN REDUCTION INTERNAL FIXATION (ORIF) ULNAR FRACTURE;  Surgeon: Iran Planas, MD;  Location: [place];  Service: Orthopedics;  Laterality: Left;   PERIPHERAL VASCULAR INTERVENTION Right 06/15/2020   Procedure: PERIPHERAL VASCULAR INTERVENTION;  Surgeon: Waynetta Sandy, MD;  Location: [place] CV LAB;  Service: Cardiovascular;  Laterality: Right;   POLYPECTOMY  08/21/2018   Procedure: POLYPECTOMY;  Surgeon: Yetta Flock, MD;  Location: [place];  Service: Gastroenterology;;   [place] N/A 12/11/2012   Procedure: [place];  Surgeon: Serafina Mitchell, MD;  Location: [facility] CATH LAB;  Service: Cardiovascular;  Laterality: N/A;   WOUND EXPLORATION Left 06/15/2020   Procedure: GROIN  EXPLORATION;  Surgeon: Waynetta Sandy, MD;  Location: [facility] OR;  Service: Vascular;  Laterality: Left;    Social History   Tobacco Use   Smoking status: Every Day    Packs/day: 0.20    Years: 25.00    Pack years: 5.00    Types: Cigarettes    Last attempt to quit: 08/01/2011     Years since quitting: 9.6   Smokeless tobacco: Never   Tobacco comments:    he smokes 6 cigarettes a week  Substance Use Topics   Alcohol use: Yes    Alcohol/week: 10.0 standard drinks    Types: 10 Glasses of wine per week    Family History  Problem Relation Age of Onset   Diabetes Father    Hypertension Father    Heart disease Father      Procedures:     Significant Diagnostic Tests:     Micro Data:   Results for orders placed or performed during the hospital encounter of 04/03/21  Resp Panel by RT-PCR (Flu A&B, Covid) Nasopharyngeal Swab     Status: None   Collection Time: 04/03/21 12:26 AM   Specimen: Nasopharyngeal Swab; Nasopharyngeal(NP) swabs in vial transport medium  Result Value Ref Range Status   SARS Coronavirus 2 by RT PCR NEGATIVE NEGATIVE Final    Comment: (NOTE) SARS-CoV-2 target nucleic acids are NOT DETECTED.  The SARS-CoV-2 RNA is generally detectable in upper respiratory specimens during the acute phase of infection. The lowest concentration of SARS-CoV-2 viral copies this assay can detect is 138 copies/mL. A negative result does not preclude SARS-Cov-2 infection and should not be used  as the sole basis for treatment or other patient management decisions. A negative result may occur with  improper specimen collection/handling, submission of specimen other than nasopharyngeal swab, presence of viral mutation(s) within the areas targeted by this assay, and inadequate number of viral copies(<138 copies/mL). A negative result must be combined with clinical observations, patient history, and epidemiological information. The expected result is Negative.  Fact Sheet for Patients:  EntrepreneurPulse.com.au  Fact Sheet for Healthcare Providers:  IncredibleEmployment.be  This test is no t yet approved or cleared by the [place] FDA and  has been authorized for detection and/or diagnosis of SARS-CoV-2 by FDA  under an Emergency Use Authorization (EUA). This EUA will remain  in effect (meaning this test can be used) for the duration of the COVID-19 declaration under Section 564(b)(1) of the Act, 21 U.S.C.section 360bbb-3(b)(1), unless the authorization is terminated  or revoked sooner.       Influenza A by PCR NEGATIVE NEGATIVE Final   Influenza B by PCR NEGATIVE NEGATIVE Final    Comment: (NOTE) The Xpert Xpress SARS-CoV-2/FLU/RSV plus assay is intended as an aid in the diagnosis of influenza from Nasopharyngeal swab specimens and should not be used as a sole basis for treatment. Nasal washings and aspirates are unacceptable for Xpert Xpress SARS-CoV-2/FLU/RSV testing.  Fact Sheet for Patients: EntrepreneurPulse.com.au  Fact Sheet for Healthcare Providers: IncredibleEmployment.be  This test is not yet approved or cleared by the [place] FDA and has been authorized for detection and/or diagnosis of SARS-CoV-2 by FDA under an Emergency Use Authorization (EUA). This EUA will remain in effect (meaning this test can be used) for the duration of the COVID-19 declaration under Section 564(b)(1) of the Act, 21 U.S.C. section 360bbb-3(b)(1), unless the authorization is terminated or revoked.  Performed at [facility], [place] [address], [place], [place] [address]   MRSA Next Gen by PCR, Nasal     Status: None   Collection Time: 04/03/21  2:41 AM   Specimen: Nasal Mucosa; Nasal Swab  Result Value Ref Range Status   MRSA by PCR Next Gen NOT DETECTED NOT DETECTED Final    Comment: (NOTE) The GeneXpert MRSA Assay (FDA approved for NASAL specimens only), is one component of a comprehensive MRSA colonization surveillance program. It is not intended to diagnose MRSA infection nor to guide or monitor treatment for MRSA infections. Test performance is not FDA approved in patients less than 29 years old. Performed at [facility], [place]  [address], [place], [place] [address]       Antimicrobials:  9/24- zosyn-     Interim history/subjective:   Actually getting a little more alert this morning. Appears to intermittently follow commands. Still has some myoclonic jerks.   Objective   BP (!) 154/90   Pulse (!) 54   Temp (!) 85.5 F (29.7 C)   Resp (!) 28   Ht 5\' 7"  (1.702 m)   Wt 83.8 kg   SpO2 96%   BMI 28.94 kg/m     Filed Weights   04/03/21 0255  Weight: 83.8 kg    Intake/Output Summary (Last 24 hours) at 04/03/2021 0924 Last data filed at 04/03/2021 0800 Gross per 24 hour  Intake 734.42 ml  Output 0 ml  Net 734.42 ml    Vent Mode: PRVC FiO2 (%):  [40 %-100 %] 40 % Set Rate:  [22 bmp-28 bmp] 28 bmp Vt Set:  [520 mL] 520 mL PEEP:  [5 cmH20] 5 cmH20 Plateau Pressure:  [  17 cmH20-23 cmH20] 21 cmH20   Examination: General appearance: 65 y.o., male, intubated Eyes: anisocoria which has been previously documented, pupils are sluggishly reactive  HENT: NCAT; dry MM Neck: Trachea midline; no lymphadenopathy Lungs: coarse mechanical breath sounds, equal chest rise  CV: borderline bradycardic, RR, no MRGs  Abdomen: Soft, non-tender; non-distended, BS present  Extremities: trace peripheral edema, lukewarm under bair hugger Neuro: Does not follow commands, intermittent myoclonic jerks, sustained whole body shaking to stimulation.  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 Myoclonus could be related to ABI vs uremia. - gradual rewarming to 36C - neuro consulted for myoclonic jerks, have started keppra, plan for EEG - keep sedation light  Post-PEA arrest: He actually now appears to be doing a couple of purposeful movements. ?hypoglycemia, hypothermia as cause. - TTE - neuro consulted as above - gradual rewarming to  36  Acute on chronic hypercapnic respiratory failure: - full vent support - keep sedation light as above  ESRD AGMA HD via RUE AVF - nephro consulted - bicarb gtt for now given acidosis severity if this seems predominantly due to renal failure pending workup of anion gap - check BHB, osm at admission labs  Hypothermia ?caused by hypoglycemia but I've never seen such profound hypothermia from hypoglycemia alone - check tsh, t4, cortisol  Mildly reduced EF HTN - holding home antihypertensives for now  Pancytopenia: Previously attributed to chronic liver disease. - ctm   Best practice:  Diet: Clinical Dietician consult for tube feeds Pain/Anxiety/Delirium protocol (if indicated): N/A.  Off sedation for neurologic assessment. VAP protocol (if indicated): YES DVT prophylaxis: SCDs for now GI prophylaxis: Protonix Glucose control: N/A Mobility/Activity: bed rest   Code Status: Full Code Family Communication:  patient's family (sister, Neoma Laming, updated by phone) - will reach out later this morning Disposition: ICU

## 2021-04-03 NOTE — Progress Notes (Signed)
eLink Physician-Brief Progress Note Patient Name: Raymond [name] DOB: 07/02/1956 MRN: [id number]   Date of Service  04/03/2021  HPI/Events of Note  Neutropenia - WBC = 0.7, Neutrophils = 47 and Immature Granulocytes = 1. ANC = 336 which is considered severe neutropenia.   eICU Interventions  Plan: Granix 300 mcg [place] X 1 now. Rounding team to decide on further dosing.     Intervention Category Major Interventions: Other:  Lysle Dingwall 04/03/2021, 6:40 AM

## 2021-04-03 NOTE — H&P (Signed)
NAME:  Raymond [name] MRN:  [id number] DOB:  [birth year] LOS: 0 ADMISSION DATE:  04/03/2021 DATE OF SERVICE:  04/03/2021  CHIEF COMPLAINT:  cardiac arrest in the field   HISTORY & PHYSICAL  History of Present Illness  This 65 y.o. African American male smoker presented to the [facility] Emergency Department via EMS with complaints of altered mental status.  A well-check request was called in by a family member, and EMS apparently found the patient to be unresponsive and hypoglycemic (22).  He subsequently experienced cardiac arrest in the field.  CPR was initiated with ROSC achieved after 20 min of CPR/ACLS efforts per verbal report (no further details at this time).  The patient was also noted to be hypothermic (rectal probe unable to register temp).  In the ER, the patient had [facility] airway exchanged to endotracheal tube (with RSI medications).  He was started on [employer] [facility] [employer].  Monitor showed junctional bradycardia.  PCCM asked to admit.  REVIEW OF SYSTEMS This patient is critically ill and cannot provide additional history nor review of systems due to mental status/unconsciousness and endotracheally intubated.   Past Medical/Surgical/Social/Family History   Past Medical History:  Diagnosis Date   Anemia    CAD (coronary artery disease)    a. 03/2018: cath showing 24 to 30% stenosis along the LAD, luminal irregularities along the RCA, and angiographically normal LCx   COPD (chronic obstructive pulmonary disease) (HCC)    ESRD (end stage renal disease) on dialysis ([place])    "MWF; Jeneen Rinks" (07/22/17)   Hepatitis C    Still positive s/p liver biopsy at [facility]  and interferon therapy for 6 months. Most recent lab work was on 10/24/12   Hepatitis C    "took the tx; gone now" (12/05/2016)  Was treated   History of blood transfusion ~ 2012/2013   "related to my kidneys; blood was low"   Hypertension    Peripheral arterial disease ([place])    Substance abuse ([place])    Thyroid  disease    Type 2 diabetes mellitus with other diabetic kidney complication ([place]) 8/52/7782    Past Surgical History:  Procedure Laterality Date   ABDOMINAL AORTOGRAM W/LOWER EXTREMITY N/A 02/08/2018   Procedure: ABDOMINAL AORTOGRAM W/LOWER EXTREMITY;  Surgeon: Conrad Winslow, MD;  Location: [place] CV [facility];  Service: Cardiovascular;  Laterality: N/A;   ABDOMINAL AORTOGRAM W/LOWER EXTREMITY Bilateral 06/15/2020   Procedure: ABDOMINAL AORTOGRAM W/LOWER EXTREMITY;  Surgeon: Waynetta Sandy, MD;  Location: [place] [facility];  Service: Cardiovascular;  Laterality: Bilateral;   AV FISTULA PLACEMENT Left Aug. 2013 ?   BIOPSY  01/17/2020   Procedure: BIOPSY;  Surgeon: Jackquline Denmark, MD;  Location: [place];  Service: Endoscopy;;   COLONOSCOPY WITH PROPOFOL N/A 08/21/2018   Procedure: COLONOSCOPY WITH PROPOFOL;  Surgeon: Yetta Flock, MD;  Location: [place];  Service: Gastroenterology;  Laterality: N/A;   ENTEROSCOPY N/A 01/17/2020   Procedure: ENTEROSCOPY;  Surgeon: Jackquline Denmark, MD;  Location: [facility] ENDOSCOPY;  Service: Endoscopy;  Laterality: N/A;   ENTEROSCOPY N/A 07/31/2020   Procedure: ENTEROSCOPY;  Surgeon: Doran Stabler, MD;  Location: [place];  Service: Gastroenterology;  Laterality: N/A;   ENTEROSCOPY N/A 01/27/2021   Procedure: ENTEROSCOPY;  Surgeon: Yetta Flock, MD;  Location: [facility] ENDOSCOPY;  Service: Gastroenterology;  Laterality: N/A;   ESOPHAGOGASTRODUODENOSCOPY (EGD) WITH PROPOFOL N/A 08/20/2018   Procedure: ESOPHAGOGASTRODUODENOSCOPY (EGD) WITH PROPOFOL;  Surgeon: Gatha Mayer, MD;  Location: [place];  Service: Endoscopy;  Laterality: N/A;   FEMORAL-POPLITEAL BYPASS GRAFT Left 04/11/2018   Procedure: BYPASS GRAFT FEMORAL-POPLITEAL ARTERY LEFT LEG;  Surgeon: Rosetta Posner, MD;  Location: [facility] OR;  Service: Vascular;  Laterality: Left;   GIVENS CAPSULE STUDY N/A 07/31/2020   Procedure: GIVENS CAPSULE STUDY;  Surgeon: Doran Stabler, MD;   Location: [place];  Service: Gastroenterology;  Laterality: N/A;   HOT HEMOSTASIS N/A 08/20/2018   Procedure: HOT HEMOSTASIS (ARGON PLASMA COAGULATION/BICAP);  Surgeon: Gatha Mayer, MD;  Location: [facility] ENDOSCOPY;  Service: Endoscopy;  Laterality: N/A;   HOT HEMOSTASIS N/A 01/17/2020   Procedure: HOT HEMOSTASIS (ARGON PLASMA COAGULATION/BICAP);  Surgeon: Jackquline Denmark, MD;  Location: [facility] ENDOSCOPY;  Service: Endoscopy;  Laterality: N/A;   HOT HEMOSTASIS N/A 01/27/2021   Procedure: HOT HEMOSTASIS (ARGON PLASMA COAGULATION/BICAP);  Surgeon: Yetta Flock, MD;  Location: [facility] ENDOSCOPY;  Service: Gastroenterology;  Laterality: N/A;   LEFT HEART CATH AND CORONARY ANGIOGRAPHY N/A 03/29/2018   Procedure: LEFT HEART CATH AND CORONARY ANGIOGRAPHY;  Surgeon: Nelva Bush, MD;  Location: [place] CV LAB;  Service: Cardiovascular;  Laterality: N/A;   LIVER BIOPSY     ORIF ULNAR FRACTURE Left 05/18/2017   Procedure: OPEN REDUCTION INTERNAL FIXATION (ORIF) ULNAR FRACTURE;  Surgeon: Iran Planas, MD;  Location: [place];  Service: Orthopedics;  Laterality: Left;   PERIPHERAL VASCULAR INTERVENTION Right 06/15/2020   Procedure: PERIPHERAL VASCULAR INTERVENTION;  Surgeon: Waynetta Sandy, MD;  Location: [place] CV LAB;  Service: Cardiovascular;  Laterality: Right;   POLYPECTOMY  08/21/2018   Procedure: POLYPECTOMY;  Surgeon: Yetta Flock, MD;  Location: [place];  Service: Gastroenterology;;   Earney Mallet N/A 12/11/2012   Procedure: Earney Mallet;  Surgeon: Serafina Mitchell, MD;  Location: [facility] CATH LAB;  Service: Cardiovascular;  Laterality: N/A;   WOUND EXPLORATION Left 06/15/2020   Procedure: GROIN  EXPLORATION;  Surgeon: Waynetta Sandy, MD;  Location: [facility] OR;  Service: Vascular;  Laterality: Left;    Social History   Tobacco Use   Smoking status: Every Day    Packs/day: 0.20    Years: 25.00    Pack years: 5.00    Types: Cigarettes    Last attempt to quit: 08/01/2011     Years since quitting: 9.6   Smokeless tobacco: Never   Tobacco comments:    he smokes 6 cigarettes a week  Substance Use Topics   Alcohol use: Yes    Alcohol/week: 10.0 standard drinks    Types: 10 Glasses of wine per week    Family History  Problem Relation Age of Onset   Diabetes Father    Hypertension Father    Heart disease Father      Procedures:     Significant Diagnostic Tests:     Micro Data:   Results for orders placed or performed during the hospital encounter of 01/25/21  Resp Panel by RT-PCR (Flu A&B, Covid) Nasopharyngeal Swab     Status: None   Collection Time: 01/25/21  4:07 PM   Specimen: Nasopharyngeal Swab; Nasopharyngeal(NP) swabs in vial transport medium  Result Value Ref Range Status   SARS Coronavirus 2 by RT PCR NEGATIVE NEGATIVE Final    Comment: (NOTE) SARS-CoV-2 target nucleic acids are NOT DETECTED.  The SARS-CoV-2 RNA is generally detectable in upper respiratory specimens during the acute phase of infection. The lowest concentration of SARS-CoV-2 viral copies this assay can detect is 138 copies/mL. A negative result does not preclude SARS-Cov-2 infection and should not be  used as the sole basis for treatment or other patient management decisions. A negative result may occur with  improper specimen collection/handling, submission of specimen other than nasopharyngeal swab, presence of viral mutation(s) within the areas targeted by this assay, and inadequate number of viral copies(<138 copies/mL). A negative result must be combined with clinical observations, patient history, and epidemiological information. The expected result is Negative.  Fact Sheet for Patients:  EntrepreneurPulse.com.au  Fact Sheet for Healthcare Providers:  IncredibleEmployment.be  This test is no t yet approved or cleared by the [place] FDA and  has been authorized for detection and/or diagnosis of SARS-CoV-2 by FDA  under an Emergency Use Authorization (EUA). This EUA will remain  in effect (meaning this test can be used) for the duration of the COVID-19 declaration under Section 564(b)(1) of the Act, 21 U.S.C.section 360bbb-3(b)(1), unless the authorization is terminated  or revoked sooner.       Influenza A by PCR NEGATIVE NEGATIVE Final   Influenza B by PCR NEGATIVE NEGATIVE Final    Comment: (NOTE) The Xpert Xpress SARS-CoV-2/FLU/RSV plus assay is intended as an aid in the diagnosis of influenza from Nasopharyngeal swab specimens and should not be used as a sole basis for treatment. Nasal washings and aspirates are unacceptable for Xpert Xpress SARS-CoV-2/FLU/RSV testing.  Fact Sheet for Patients: EntrepreneurPulse.com.au  Fact Sheet for Healthcare Providers: IncredibleEmployment.be  This test is not yet approved or cleared by the [place] FDA and has been authorized for detection and/or diagnosis of SARS-CoV-2 by FDA under an Emergency Use Authorization (EUA). This EUA will remain in effect (meaning this test can be used) for the duration of the COVID-19 declaration under Section 564(b)(1) of the Act, 21 U.S.C. section 360bbb-3(b)(1), unless the authorization is terminated or revoked.  Performed at [facility], [place] [address] [place], [place] [address]       Antimicrobials:      Interim history/subjective:     Objective   BP 111/78   Pulse (!) 41   Resp (!) 28   Ht 5\' 7"  (1.702 m)   SpO2 100%   BMI 24.35 kg/m     There were no vitals filed for this visit. No intake or output data in the 24 hours ending 04/03/21 0130  Vent Mode: PRVC FiO2 (%):  [100 %] 100 % Set Rate:  [22 bmp] 22 bmp Vt Set:  [520 mL] 520 mL PEEP:  [5 cmH20] 5 cmH20 Plateau Pressure:  [17 cmH20] 17 cmH20   Examination: GENERAL: Intubated. Comatose, well-developed. Still under the effects of RSI medications. HEAD: normocephalic, atraumatic EYE:  anisocoria (OS>>OD). No scleral icterus, no pallor. THROAT/ORAL CAVITY: Normal dentition. No oral thrush. No exudate. Mucous membranes are moist. No tonsillar enlargement. ETT in situ.  NECK: supple, no thyromegaly, no JVD, no lymphadenopathy. Trachea midline. CHEST/LUNG: symmetric in development and expansion. Good air entry. No crackles. No wheezes. HEART: Regular S1 and S2 without murmur, rub or gallop. Bradycardic. ABDOMEN: soft, nontender, nondistended. Normoactive bowel sounds. No rebound. No guarding. No hepatosplenomegaly. EXTREMITIES: L UE AV fistula. Edema: 1+. No cyanosis. No clubbing. 2+ DP pulses LYMPHATIC: no cervical/axillary/inguinal lymph nodes appreciated MUSCULOSKELETAL: No point tenderness. No bulk atrophy. Joints: normal inspection.  SKIN:  No rash or lesion. NEUROLOGIC: Anisocoria.  Still under the effects of RSI medications.   Resolved Hospital Problem list      Assessment & Plan:   ASSESSMENT/PLAN:  ASSESSMENT (included in the Hospital Problem List)  Principal Problem:   Cardiac  arrest [facility]) Active Problems:   Hyperkalemia   Anisocoria   Hypoglycemia   HFrEF (heart failure with reduced ejection fraction) (HCC)   Hypothermia not due to cold exposure   ESRD (end stage renal disease) ([place])   By systems: PULMONARY Endotracheally intubated Titrate vent settings based on ABG results Sedation with propofol/fentanyl, if needed   CARDIOVASCULAR Cardiopulmonary arrest, unknown etiology Junctional bradycardia HFrEF (LVEF 45-50%) Currently, normotensive Hemodynamic monitoring per protocol Trend troponin and CK-total. Repeat EKG in am Slow rewarming to 19 F for now The patient is noted to be on amiodarone, metoprolol and carvedilol at home.  Hold for now.   RENAL ESRD Hyperkalemia Nephrology help appreciated Agree with temporizing measures Follow up serum K (still waiting for first labs)   GASTROINTESTINAL: No acute issues GI PROPHYLAXIS: Protonix    HEMATOLOGIC DVT PROPHYLAXIS: SCDs for now Will decide about heparin after CT head   INFECTIOUS: No acute issues Follow up cultures (blood, trach aspirate)   ENDOCRINE Hypoglycemia Type 2 diabetes appears in his medical record; however, his home medication list, however, does not include any antidiabetes medications.  His highest HgbA1c is 5.3%.   NEUROLOGIC Anoxic encephalopathy An[place] Exposure hypothermia CT head without contrast now Slow rewarming.  Monitor K. Close neurologic monitoring.   PLAN/RECOMMENDATIONS  Admit to ICU under my service (Attending: Renee Pain, MD) with the diagnoses highlighted above in the active Hospital Problem List (ASSESSMENT). See above    My assessment, plan of care, findings, medications, side effects, etc. were discussed with: nurse.   Best practice:  Diet: Clinical Dietician consult for tube feeds Pain/Anxiety/Delirium protocol (if indicated): N/A.  Off sedation for neurologic assessment. VAP protocol (if indicated): YES DVT prophylaxis: SCDs for now GI prophylaxis: Protonix Glucose control: N/A Mobility/Activity: bed rest   Code Status: Full Code Family Communication:  patient's family (sister, Neoma Laming, updated by phone) Disposition: admit to ICU   Labs   CBC: Recent Labs  Lab 04/03/21 0025 04/03/21 0046 04/03/21 0050  WBC 1.5*  --   --   NEUTROABS PENDING  --   --   HGB 11.6* 13.3 10.5*  HCT 39.0 39.0 31.0*  MCV 114.7*  --   --   PLT PENDING  --   --     Basic Metabolic Panel: Recent Labs  Lab 04/03/21 0046 04/03/21 0050  NA 139 149*  K 6.0* 4.8  CL 110  --   GLUCOSE 96  --   BUN 122*  --   CREATININE >18.00*  --    GFR: CrCl cannot be calculated (This lab value cannot be used to calculate CrCl because it is not a number: >18.00). Recent Labs  Lab 04/03/21 0025  WBC 1.5*    Liver Function Tests: No results for input(s): AST, ALT, ALKPHOS, BILITOT, PROT, ALBUMIN in the last 168 hours. No results  for input(s): LIPASE, AMYLASE in the last 168 hours. No results for input(s): AMMONIA in the last 168 hours.  ABG    Component Value Date/Time   PHART 7.250 (L) 04/03/2021 0050   PCO2ART 68.5 (HH) 04/03/2021 0050   PO2ART 266 (H) 04/03/2021 0050   HCO3 32.2 (H) 04/03/2021 0050   TCO2 35 (H) 04/03/2021 0050   ACIDBASEDEF 8.0 (H) 08/01/2018 2238   O2SAT 100.0 04/03/2021 0050     Coagulation Profile: No results for input(s): INR, PROTIME in the last 168 hours.  Cardiac Enzymes: No results for input(s): CKTOTAL, CKMB, CKMBINDEX, TROPONINI in the last 168 hours.  HbA1C: Hgb A1c  MFr Bld  Date/Time Value Ref Range Status  01/20/2021 03:27 AM 5.3 4.8 - 5.6 % Final    Comment:    (NOTE) Pre diabetes:          5.7%-6.4%  Diabetes:              >6.4%  Glycemic control for   <7.0% adults with diabetes   07/13/2017 10:20 AM 4.6 (L) 4.8 - 5.6 % Final    Comment:             Prediabetes: 5.7 - 6.4          Diabetes: >6.4          Glycemic control for adults with diabetes: <7.0     CBG: Recent Labs  Lab 04/03/21 0013 04/03/21 0024  GLUCAP 68* 95     Past Medical History   Past Medical History:  Diagnosis Date   Anemia    CAD (coronary artery disease)    a. 03/2018: cath showing 20 to 30% stenosis along the LAD, luminal irregularities along the RCA, and angiographically normal LCx   COPD (chronic obstructive pulmonary disease) (HCC)    ESRD (end stage renal disease) on dialysis ([place])    "MWF; Jeneen Rinks" (07/22/17)   Hepatitis C    Still positive s/p liver biopsy at [facility]  and interferon therapy for 6 months. Most recent lab work was on 10/24/12   Hepatitis C    "took the tx; gone now" (12/05/2016)  Was treated   History of blood transfusion ~ 2012/2013   "related to my kidneys; blood was low"   Hypertension    Peripheral arterial disease ([place])    Substance abuse ([place])    Thyroid disease    Type 2 diabetes mellitus with other diabetic kidney complication ([place])  0/76/2263      Surgical History    Past Surgical History:  Procedure Laterality Date   ABDOMINAL AORTOGRAM W/LOWER EXTREMITY N/A 02/08/2018   Procedure: ABDOMINAL AORTOGRAM W/LOWER EXTREMITY;  Surgeon: Conrad El Camino Angosto, MD;  Location: [place] CV LAB;  Service: Cardiovascular;  Laterality: N/A;   ABDOMINAL AORTOGRAM W/LOWER EXTREMITY Bilateral 06/15/2020   Procedure: ABDOMINAL AORTOGRAM W/LOWER EXTREMITY;  Surgeon: Waynetta Sandy, MD;  Location: [place] CV LAB;  Service: Cardiovascular;  Laterality: Bilateral;   AV FISTULA PLACEMENT Left Aug. 2013 ?   BIOPSY  01/17/2020   Procedure: BIOPSY;  Surgeon: Jackquline Denmark, MD;  Location: [place];  Service: Endoscopy;;   COLONOSCOPY WITH PROPOFOL N/A 08/21/2018   Procedure: COLONOSCOPY WITH PROPOFOL;  Surgeon: Yetta Flock, MD;  Location: [place];  Service: Gastroenterology;  Laterality: N/A;   ENTEROSCOPY N/A 01/17/2020   Procedure: ENTEROSCOPY;  Surgeon: Jackquline Denmark, MD;  Location: [facility] ENDOSCOPY;  Service: Endoscopy;  Laterality: N/A;   ENTEROSCOPY N/A 07/31/2020   Procedure: ENTEROSCOPY;  Surgeon: Doran Stabler, MD;  Location: [place];  Service: Gastroenterology;  Laterality: N/A;   ENTEROSCOPY N/A 01/27/2021   Procedure: ENTEROSCOPY;  Surgeon: Yetta Flock, MD;  Location: [facility] ENDOSCOPY;  Service: Gastroenterology;  Laterality: N/A;   ESOPHAGOGASTRODUODENOSCOPY (EGD) WITH PROPOFOL N/A 08/20/2018   Procedure: ESOPHAGOGASTRODUODENOSCOPY (EGD) WITH PROPOFOL;  Surgeon: Gatha Mayer, MD;  Location: [place];  Service: Endoscopy;  Laterality: N/A;   FEMORAL-POPLITEAL BYPASS GRAFT Left 04/11/2018   Procedure: BYPASS GRAFT FEMORAL-POPLITEAL ARTERY LEFT LEG;  Surgeon: Rosetta Posner, MD;  Location: [place];  Service: Vascular;  Laterality: Left;   GIVENS CAPSULE STUDY N/A 07/31/2020  Procedure: GIVENS CAPSULE STUDY;  Surgeon: Doran Stabler, MD;  Location: [place];  Service: Gastroenterology;   Laterality: N/A;   HOT HEMOSTASIS N/A 08/20/2018   Procedure: HOT HEMOSTASIS (ARGON PLASMA COAGULATION/BICAP);  Surgeon: Gatha Mayer, MD;  Location: [facility] ENDOSCOPY;  Service: Endoscopy;  Laterality: N/A;   HOT HEMOSTASIS N/A 01/17/2020   Procedure: HOT HEMOSTASIS (ARGON PLASMA COAGULATION/BICAP);  Surgeon: Jackquline Denmark, MD;  Location: [facility] ENDOSCOPY;  Service: Endoscopy;  Laterality: N/A;   HOT HEMOSTASIS N/A 01/27/2021   Procedure: HOT HEMOSTASIS (ARGON PLASMA COAGULATION/BICAP);  Surgeon: Yetta Flock, MD;  Location: [facility] ENDOSCOPY;  Service: Gastroenterology;  Laterality: N/A;   LEFT HEART CATH AND CORONARY ANGIOGRAPHY N/A 03/29/2018   Procedure: LEFT HEART CATH AND CORONARY ANGIOGRAPHY;  Surgeon: Nelva Bush, MD;  Location: [place] CV LAB;  Service: Cardiovascular;  Laterality: N/A;   LIVER BIOPSY     ORIF ULNAR FRACTURE Left 05/18/2017   Procedure: OPEN REDUCTION INTERNAL FIXATION (ORIF) ULNAR FRACTURE;  Surgeon: Iran Planas, MD;  Location: [place];  Service: Orthopedics;  Laterality: Left;   PERIPHERAL VASCULAR INTERVENTION Right 06/15/2020   Procedure: PERIPHERAL VASCULAR INTERVENTION;  Surgeon: Waynetta Sandy, MD;  Location: [place] CV LAB;  Service: Cardiovascular;  Laterality: Right;   POLYPECTOMY  08/21/2018   Procedure: POLYPECTOMY;  Surgeon: Yetta Flock, MD;  Location: [place];  Service: Gastroenterology;;   Earney Mallet N/A 12/11/2012   Procedure: Earney Mallet;  Surgeon: Serafina Mitchell, MD;  Location: [facility] CATH LAB;  Service: Cardiovascular;  Laterality: N/A;   WOUND EXPLORATION Left 06/15/2020   Procedure: GROIN  EXPLORATION;  Surgeon: Waynetta Sandy, MD;  Location: [facility] OR;  Service: Vascular;  Laterality: Left;      Social History   Social History   Socioeconomic History   Marital status: Single    Spouse name: Not on file   Number of children: 2   Years of education: Not on file   Highest education level: 11th grade   Occupational History   Not on file  Tobacco Use   Smoking status: Every Day    Packs/day: 0.20    Years: 25.00    Pack years: 5.00    Types: Cigarettes    Last attempt to quit: 08/01/2011    Years since quitting: 9.6   Smokeless tobacco: Never   Tobacco comments:    he smokes 6 cigarettes a week  Vaping Use   Vaping Use: Never used  Substance and Sexual Activity   Alcohol use: Yes    Alcohol/week: 10.0 standard drinks    Types: 10 Glasses of wine per week   Drug use: Not Currently    Types: Cocaine   Sexual activity: Not on file  Other Topics Concern   Not on file  Social History Narrative   Patient is living in a apartment alone. Professional cement finisher. Has 2 children (45yo & 47yo sons). Plans to make HCPOA for sister Neoma Laming to make decisions for him should he need them.    Social Determinants of Health   Financial Resource Strain: Not on file  Food Insecurity: Not on file  Transportation Needs: Not on file  Physical Activity: Not on file  Stress: Not on file  Social Connections: Not on file      Family History    Family History  Problem Relation Age of Onset   Diabetes Father    Hypertension Father    Heart disease Father    family history includes Diabetes in  his father; Heart disease in his father; Hypertension in his father.    Allergies No Known Allergies    Current Medications  Current Facility-Administered Medications:    calcium chloride injection, , Intravenous, PRN, Long, Wonda Olds, MD, 1 g at 04/03/21 0031   dextrose 10 % infusion, , Intravenous, Continuous, Long, Wonda Olds, MD, Last Rate: 125 mL/hr at 04/03/21 0100, New Bag at 04/03/21 0100   EPINEPHrine (ADRENALIN) 1 MG/10ML injection, , Intravenous, PRN, Long, Wonda Olds, MD, 1 mg at 04/03/21 0030   etomidate (AMIDATE) injection, , Intravenous, PRN, Long, Wonda Olds, MD, 20 mg at 04/03/21 0018   fentaNYL (SUBLIMAZE) injection 50 mcg, 50 mcg, Intravenous, Q15 min PRN, Long, Wonda Olds, MD    fentaNYL (SUBLIMAZE) injection 50 mcg, 50 mcg, Intravenous, Q2H PRN, Long, Wonda Olds, MD   midazolam (VERSED) injection 1 mg, 1 mg, Intravenous, Q15 min PRN, Long, Wonda Olds, MD   midazolam (VERSED) injection 1 mg, 1 mg, Intravenous, Q2H PRN, Long, Wonda Olds, MD   rocuronium [facility]) injection, , Intravenous, PRN, Long, Wonda Olds, MD, 100 mg at 04/03/21 0019   sodium bicarbonate injection, , Intravenous, PRN, Long, Wonda Olds, MD, 50 mEq at 04/03/21 0032  Current Outpatient Medications:    acetaminophen (TYLENOL) 325 MG tablet, Take 650 mg by mouth every 6 (six) hours as needed for mild pain., Disp: , Rfl:    albuterol (VENTOLIN HFA) 108 (90 Base) MCG/ACT inhaler, Inhale 2 puffs into the lungs every 4 (four) hours as needed for shortness of breath or wheezing., Disp: , Rfl:    amiodarone (PACERONE) 200 MG tablet, Take 1 tablet (200 mg total) by mouth daily., Disp: 90 tablet, Rfl: 1   amiodarone (PACERONE) 200 MG tablet, Take by mouth., Disp: , Rfl:    amLODipine (NORVASC) 2.5 MG tablet, Take 2.5 mg by mouth daily. Take 1 tablet by mouth every night, Disp: , Rfl:    carvedilol (COREG) 6.25 MG tablet, Take 1 tablet (6.25 mg total) by mouth 2 (two) times daily with a meal., Disp: 180 tablet, Rfl: 1   cinacalcet (SENSIPAR) 30 MG tablet, Take 6 tablets (180 mg total) by mouth Every Tuesday,Thursday,and Saturday with dialysis., Disp: 60 tablet, Rfl: 1   folic acid (FOLVITE) 1 MG tablet, Take 1 tablet (1 mg total) by mouth daily., Disp: 90 tablet, Rfl: 1   Methoxy PEG-Epoetin Beta (MIRCERA IJ), Mircera, Disp: , Rfl:    metoprolol succinate (TOPROL-XL) 50 MG 24 hr tablet, Take 50 mg by mouth daily. Take 2 tablets by mouth every day with or immediately following a meal., Disp: , Rfl:    Multiple Vitamin (MULTIVITAMIN WITH MINERALS) TABS tablet, Take 1 tablet by mouth daily., Disp: 90 tablet, Rfl: 1   nicotine (NICODERM CQ - DOSED IN MG/24 HOURS) 14 mg/24hr patch, Place 1 patch (14 mg total) onto the skin daily.,  Disp: 28 patch, Rfl: 0   nicotine (NICODERM CQ) 14 mg/24hr patch, Place onto the skin., Disp: , Rfl:    octreotide (SANDOSTATIN LAR) 10 MG injection, Inject 10 mg into the muscle every 28 (twenty-eight) days., Disp: 1 each, Rfl: 0   pantoprazole (PROTONIX) 40 MG tablet, Take 40 mg by mouth daily., Disp: , Rfl:    prochlorperazine (COMPAZINE) 5 MG tablet, Take 1 tablet (5 mg total) by mouth every 8 (eight) hours as needed for nausea or vomiting., Disp: 20 tablet, Rfl: 0   QUEtiapine (SEROQUEL) 100 MG tablet, Take 100 mg by mouth at bedtime., Disp: , Rfl:  rosuvastatin (CRESTOR) 10 MG tablet, Take 1 tablet (10 mg total) by mouth daily., Disp: 90 tablet, Rfl: 1   sertraline (ZOLOFT) 25 MG tablet, Take 25 mg by mouth daily., Disp: , Rfl:    sevelamer carbonate (RENVELA) 800 MG tablet, Take 4 tablets (3,200 mg total) by mouth 3 (three) times daily with meals., Disp: 120 tablet, Rfl: 0   thiamine 100 MG tablet, Take 1 tablet (100 mg total) by mouth daily., Disp: 90 tablet, Rfl: 1  Facility-Administered Medications Ordered in Other Encounters:    midazolam (VERSED) 5 MG/5ML injection, , , Anesthesia Intra-op, Sammie Bench, CRNA, 2 mg at 04/11/18 1042   Home Medications  Prior to Admission medications   Medication Sig Start Date End Date Taking? Authorizing Provider  acetaminophen (TYLENOL) 325 MG tablet Take 650 mg by mouth every 6 (six) hours as needed for mild pain.    [provider]  albuterol (VENTOLIN HFA) 108 (90 Base) MCG/ACT inhaler Inhale 2 puffs into the lungs every 4 (four) hours as needed for shortness of breath or wheezing. 11/06/19   [provider]  amiodarone (PACERONE) 200 MG tablet Take 1 tablet (200 mg total) by mouth daily. 12/11/20   Mercy Riding, MD  amiodarone (PACERONE) 200 MG tablet Take by mouth. 02/01/21   [provider]  amLODipine (NORVASC) 2.5 MG tablet Take 2.5 mg by mouth daily. Take 1 tablet by mouth every night    [provider]  carvedilol (COREG) 6.25 MG tablet Take 1 tablet (6.25 mg total) by mouth 2 (two) times daily with a meal. 12/11/20   Mercy Riding, MD  cinacalcet (SENSIPAR) 30 MG tablet Take 6 tablets (180 mg total) by mouth Every Tuesday,Thursday,and Saturday with dialysis. 12/12/20   Mercy Riding, MD  folic acid (FOLVITE) 1 MG tablet Take 1 tablet (1 mg total) by mouth daily. 12/12/20   Mercy Riding, MD  Methoxy PEG-Epoetin Beta (MIRCERA IJ) Mircera 02/02/21 02/01/22  [provider]  metoprolol succinate (TOPROL-XL) 50 MG 24 hr tablet Take 50 mg by mouth daily. Take 2 tablets by mouth every day with or immediately following a meal.    [provider]  Multiple Vitamin (MULTIVITAMIN WITH MINERALS) TABS tablet Take 1 tablet by mouth daily. 12/12/20   Mercy Riding, MD  nicotine (NICODERM CQ - DOSED IN MG/24 HOURS) 14 mg/24hr patch Place 1 patch (14 mg total) onto the skin daily. 12/12/20   Mercy Riding, MD  nicotine (NICODERM CQ) 14 mg/24hr patch Place onto the skin. 02/01/21   [provider]  octreotide (SANDOSTATIN LAR) 10 MG injection Inject 10 mg into the muscle every 28 (twenty-eight) days. 02/01/21   Armbruster, Carlota Raspberry, MD  pantoprazole (PROTONIX) 40 MG tablet Take 40 mg by mouth daily.    [provider]  prochlorperazine (COMPAZINE) 5 MG tablet Take 1 tablet (5 mg total) by mouth every 8 (eight) hours as needed for nausea or vomiting. 12/11/20   Mercy Riding, MD  QUEtiapine (SEROQUEL) 100 MG tablet Take 100 mg by mouth at bedtime. 01/25/21   [provider]  rosuvastatin (CRESTOR) 10 MG tablet Take 1 tablet (10 mg total) by mouth daily. 12/11/20 06/09/21  Mercy Riding, MD  sertraline (ZOLOFT) 25 MG tablet Take 25 mg by mouth daily. 02/24/21   [provider]  sevelamer carbonate (RENVELA) 800 MG tablet Take 4 tablets (3,200 mg total) by mouth 3 (three) times daily with meals. 09/14/18  Monica Becton, MD  thiamine 100 MG tablet Take 1 tablet (100  mg total) by mouth daily. 12/12/20   Mercy Riding, MD  apixaban (ELIQUIS) 5 MG TABS tablet Take 1 tablet (5 mg total) by mouth 2 (two) times daily. 06/22/20 08/03/20  Karoline Caldwell, PA-C      Critical care time: 60 minutes.  The treatment and management of the patient's condition was required based on the threat of imminent deterioration. This time reflects time spent by the physician evaluating, providing care and managing the critically ill patient's care. The time was spent at the immediate bedside (or on the same floor/unit and dedicated to this patient's care). Time involved in separately billable procedures is NOT included int he critical care time indicated above. Family meeting and update time may be included above if and only if the patient is unable/incompetent to participate in clinical interview and/or decision making, and the discussion was necessary to determining treatment decisions.   Renee Pain, MD Board Certified by the [employer], [place]

## 2021-04-03 NOTE — ED Notes (Addendum)
Rectal thermometer unable to read pt's rectal temperature. Per critical care admitting provider at bedside, bair hugger placed on pt at 32 degrees celsius.

## 2021-04-03 NOTE — Progress Notes (Addendum)
eLink Physician-Brief Progress Note Patient Name: Raymond [name] DOB: 10-Nov-1955 MRN: [id number]   Date of Service  04/03/2021  HPI/Events of Note  Cardiac arrest, ESRD, a fib, HTN, DM Previous trop at 130 in AM. CTH : no bleed or CVA.   eICU Interventions  - follow through troponin once .      Intervention Category Minor Interventions: Other:  Elmer Sow 04/03/2021, 9:52 PM  00:01 Troponin > 250. Discussed with bed side RN, Gerald Stabs.  Slowly re warming slowly. S/p cardiac arrest.CPR 20 mins. Mildly reduced ef.  Woke up in day and following commands. On propofol.   - will trend troponin at 5 AM. If rising , get Cardiology opinion. On sq heparin.  Pancytopenic, plt < 60 K. Avoiding NSAID/asa at this time.

## 2021-04-03 NOTE — Consult Note (Signed)
Neurology Consultation  Reason for Consult: Abnormal movements following cardiac arrest Referring Physician: Dr. Carson Myrtle  CC: Abnormal movements following cardiac arrest  History is obtained from: Chart review, unable to obtain from patient due to patient's condition.  HPI: Raymond [name] is a 65 y.o. male with a medical history significant for coronary artery disease, COPD, ESRD with hemodialysis MWF, hepatitis C, essential hypertension, PAD, tobacco use, substance abuse, and type 2 diabetes melitis who presented to the ED 9/24 via EMS for evaluation of altered mental status.  EMS was called by patient's family to complete a welfare check where they found the patient to be cold, unresponsive hypoglycemic with a blood glucose of 22, and with a weak and thready pulse.  En route to the hospital patient cardiac arrested and 20 minutes of CPR was initiated before obtaining ROSC.  On arrival to the ED he was intubated for airway protection . Patient's lactate was elevated at 6.1, labs revealed severe neutropenia, and a bladder temperature showed patient was hypothermic to 45 F with subsequent Bair hugger placement.  While in the ED patient was found to have myoclonic movements with concern for anoxic brain injury and neurology was consulted for further evaluation.  ROS: Unable to obtain due to patient's condition; remains intubated and sedated in the ICU.  Past Medical History:  Diagnosis Date   Anemia    CAD (coronary artery disease)    a. 03/2018: cath showing 82 to 30% stenosis along the LAD, luminal irregularities along the RCA, and angiographically normal LCx   COPD (chronic obstructive pulmonary disease) (HCC)    ESRD (end stage renal disease) on dialysis [facility])    "MWF; Jeneen Rinks" (07/22/17)   Hepatitis C    Still positive s/p liver biopsy at [facility]  and interferon therapy for 6 months. Most recent lab work was on 10/24/12   Hepatitis C    "took the tx; gone now" (12/05/2016)  Was treated    History of blood transfusion ~ 2012/2013   "related to my kidneys; blood was low"   Hypertension    Peripheral arterial disease ([place])    Substance abuse ([place])    Thyroid disease    Type 2 diabetes mellitus with other diabetic kidney complication ([place]) 9/98/3382   Past Surgical History:  Procedure Laterality Date   ABDOMINAL AORTOGRAM W/LOWER EXTREMITY N/A 02/08/2018   Procedure: ABDOMINAL AORTOGRAM W/LOWER EXTREMITY;  Surgeon: Conrad Sligo, MD;  Location: [place] CV LAB;  Service: Cardiovascular;  Laterality: N/A;   ABDOMINAL AORTOGRAM W/LOWER EXTREMITY Bilateral 06/15/2020   Procedure: ABDOMINAL AORTOGRAM W/LOWER EXTREMITY;  Surgeon: Waynetta Sandy, MD;  Location: [place] CV LAB;  Service: Cardiovascular;  Laterality: Bilateral;   AV FISTULA PLACEMENT Left Aug. 2013 ?   BIOPSY  01/17/2020   Procedure: BIOPSY;  Surgeon: Jackquline Denmark, MD;  Location: [place];  Service: Endoscopy;;   COLONOSCOPY WITH PROPOFOL N/A 08/21/2018   Procedure: COLONOSCOPY WITH PROPOFOL;  Surgeon: Yetta Flock, MD;  Location: [place];  Service: Gastroenterology;  Laterality: N/A;   ENTEROSCOPY N/A 01/17/2020   Procedure: ENTEROSCOPY;  Surgeon: Jackquline Denmark, MD;  Location: [facility] ENDOSCOPY;  Service: Endoscopy;  Laterality: N/A;   ENTEROSCOPY N/A 07/31/2020   Procedure: ENTEROSCOPY;  Surgeon: Doran Stabler, MD;  Location: [place];  Service: Gastroenterology;  Laterality: N/A;   ENTEROSCOPY N/A 01/27/2021   Procedure: ENTEROSCOPY;  Surgeon: Yetta Flock, MD;  Location: [facility] ENDOSCOPY;  Service: Gastroenterology;  Laterality: N/A;   ESOPHAGOGASTRODUODENOSCOPY (EGD)  WITH PROPOFOL N/A 08/20/2018   Procedure: ESOPHAGOGASTRODUODENOSCOPY (EGD) WITH PROPOFOL;  Surgeon: Gatha Mayer, MD;  Location: [place];  Service: Endoscopy;  Laterality: N/A;   FEMORAL-POPLITEAL BYPASS GRAFT Left 04/11/2018   Procedure: BYPASS GRAFT FEMORAL-POPLITEAL ARTERY LEFT LEG;  Surgeon: Rosetta Posner,  MD;  Location: [facility] OR;  Service: Vascular;  Laterality: Left;   GIVENS CAPSULE STUDY N/A 07/31/2020   Procedure: GIVENS CAPSULE STUDY;  Surgeon: Doran Stabler, MD;  Location: [place];  Service: Gastroenterology;  Laterality: N/A;   HOT HEMOSTASIS N/A 08/20/2018   Procedure: HOT HEMOSTASIS (ARGON PLASMA COAGULATION/BICAP);  Surgeon: Gatha Mayer, MD;  Location: [facility] ENDOSCOPY;  Service: Endoscopy;  Laterality: N/A;   HOT HEMOSTASIS N/A 01/17/2020   Procedure: HOT HEMOSTASIS (ARGON PLASMA COAGULATION/BICAP);  Surgeon: Jackquline Denmark, MD;  Location: [facility] ENDOSCOPY;  Service: Endoscopy;  Laterality: N/A;   HOT HEMOSTASIS N/A 01/27/2021   Procedure: HOT HEMOSTASIS (ARGON PLASMA COAGULATION/BICAP);  Surgeon: Yetta Flock, MD;  Location: [facility] ENDOSCOPY;  Service: Gastroenterology;  Laterality: N/A;   LEFT HEART CATH AND CORONARY ANGIOGRAPHY N/A 03/29/2018   Procedure: LEFT HEART CATH AND CORONARY ANGIOGRAPHY;  Surgeon: Nelva Bush, MD;  Location: [place] CV LAB;  Service: Cardiovascular;  Laterality: N/A;   LIVER BIOPSY     ORIF ULNAR FRACTURE Left 05/18/2017   Procedure: OPEN REDUCTION INTERNAL FIXATION (ORIF) ULNAR FRACTURE;  Surgeon: Iran Planas, MD;  Location: [place];  Service: Orthopedics;  Laterality: Left;   PERIPHERAL VASCULAR INTERVENTION Right 06/15/2020   Procedure: PERIPHERAL VASCULAR INTERVENTION;  Surgeon: Waynetta Sandy, MD;  Location: [place] CV LAB;  Service: Cardiovascular;  Laterality: Right;   POLYPECTOMY  08/21/2018   Procedure: POLYPECTOMY;  Surgeon: Yetta Flock, MD;  Location: [place];  Service: Gastroenterology;;   Earney Mallet N/A 12/11/2012   Procedure: Earney Mallet;  Surgeon: Serafina Mitchell, MD;  Location: [facility] CATH LAB;  Service: Cardiovascular;  Laterality: N/A;   WOUND EXPLORATION Left 06/15/2020   Procedure: GROIN  EXPLORATION;  Surgeon: Waynetta Sandy, MD;  Location: [facility] OR;  Service: Vascular;  Laterality: Left;   Family  History  Problem Relation Age of Onset   Diabetes Father    Hypertension Father    Heart disease Father    Social History:   reports that he has been smoking cigarettes. He has a 5.00 pack-year smoking history. He has never used smokeless tobacco. He reports current alcohol use of about 10.0 standard drinks per week. He reports that he does not currently use drugs after having used the following drugs: Cocaine.  Medications  Current Facility-Administered Medications:    Place/Maintain arterial line, , , Until Discontinued **AND** 0.9 %  sodium chloride infusion, , Intra-arterial, PRN, Verlee Monte Hortencia Conradi, MD   0.9 %  sodium chloride infusion, , Intravenous, PRN, Maryjane Hurter, MD, Last Rate: 10 mL/hr at 04/03/21 0924, 250 mL at 04/03/21 0924   0.9 %  sodium chloride infusion, 100 mL, Intravenous, PRN, Tobie Poet E, NP   0.9 %  sodium chloride infusion, 100 mL, Intravenous, PRN, Adelfa Koh, NP   alteplase (CATHFLO ACTIVASE) injection 2 mg, 2 mg, Intracatheter, Once PRN, Adelfa Koh, NP   budesonide (PULMICORT) nebulizer solution 0.25 mg, 0.25 mg, Nebulization, BID, Mick Sell, PA-C, 0.25 mg at 04/03/21 7893   calcium chloride injection, , Intravenous, PRN, Long, Wonda Olds, MD, 1 g at 04/03/21 0031   Chlorhexidine Gluconate Cloth 2 % PADS 6 each, 6 each, Topical, Q0600, Ouida Sills,  Lawson Radar, NP   dextrose 10 % infusion, , Intravenous, Continuous, Anders Simmonds, MD, Last Rate: 50 mL/hr at 04/03/21 0403, Rate Change at 04/03/21 0403   docusate (COLACE) 50 MG/5ML liquid 100 mg, 100 mg, Per Tube, BID, Verlee Monte Hortencia Conradi, MD   docusate (COLACE) 50 MG/5ML liquid 100 mg, 100 mg, Per Tube, BID, Verlee Monte Hortencia Conradi, MD   docusate sodium (COLACE) capsule 100 mg, 100 mg, Oral, BID PRN, Andres Labrum D, PA-C   EPINEPHrine (ADRENALIN) 1 MG/10ML injection, , Intravenous, PRN, Long, Wonda Olds, MD, 1 mg at 04/03/21 0030   fentaNYL (SUBLIMAZE) injection 25 mcg, 25 mcg,  Intravenous, Q15 min PRN, Maryjane Hurter, MD   fentaNYL (SUBLIMAZE) injection 25-100 mcg, 25-100 mcg, Intravenous, Q30 min PRN, Maryjane Hurter, MD, 50 mcg at 36/64/40 3474   folic acid (FOLVITE) tablet 1 mg, 1 mg, Per Tube, Daily, Andres Labrum D, PA-C   heparin injection 1,000 Units, 1,000 Units, Dialysis, PRN, Adelfa Koh, NP   heparin injection 5,000 Units, 5,000 Units, Subcutaneous, Q8H, Verlee Monte, Hortencia Conradi, MD   hydrALAZINE (APRESOLINE) injection 10 mg, 10 mg, Intravenous, Q4H PRN, Anders Simmonds, MD, 10 mg at 04/03/21 0414   insulin aspart (novoLOG) injection 0-6 Units, 0-6 Units, Subcutaneous, Q4H, Payne, John D, PA-C, 4 Units at 04/03/21 0900   ipratropium-albuterol (DUONEB) 0.5-2.5 (3) MG/3ML nebulizer solution 3 mL, 3 mL, Nebulization, Q6H, Rollene Rotunda, John D, PA-C, 3 mL at 04/03/21 0727   levETIRAcetam (KEPPRA) 250 mg in sodium chloride 0.9 % 100 mL IVPB, 250 mg, Intravenous, Q12H, Meier, Hortencia Conradi, MD   lidocaine (PF) (XYLOCAINE) 1 % injection 5 mL, 5 mL, Intradermal, PRN, Adelfa Koh, NP   lidocaine-prilocaine (EMLA) cream 1 application, 1 application, Topical, PRN, Adelfa Koh, NP   pantoprazole (PROTONIX) injection 40 mg, 40 mg, Intravenous, QHS, Payne, John D, PA-C, 40 mg at 04/03/21 0340   pentafluoroprop-tetrafluoroeth (GEBAUERS) aerosol 1 application, 1 application, Topical, PRN, Adelfa Koh, NP   piperacillin-tazobactam (ZOSYN) IVPB 2.25 g, 2.25 g, Intravenous, Q8H, Erenest Blank, RPH, Last Rate: 100 mL/hr at 04/03/21 0657, 2.25 g at 04/03/21 0657   polyethylene glycol (MIRALAX / GLYCOLAX) packet 17 g, 17 g, Oral, Daily PRN, Andres Labrum D, PA-C   polyethylene glycol (MIRALAX / GLYCOLAX) packet 17 g, 17 g, Per Tube, Daily, Meier, Hortencia Conradi, MD   polyethylene glycol (MIRALAX / GLYCOLAX) packet 17 g, 17 g, Per Tube, Daily, Maryjane Hurter, MD   propofol (DIPRIVAN) 1000 MG/100ML infusion, 0-50 mcg/kg/min, Intravenous, Continuous, Meier,  Hortencia Conradi, MD   sodium bicarbonate 150 mEq in dextrose 5 % 1,150 mL infusion, , Intravenous, Continuous, Payne, John D, PA-C, Last Rate: 30 mL/hr at 04/03/21 0900, Infusion Verify at 04/03/21 0900   sodium bicarbonate injection, , Intravenous, PRN, Long, Wonda Olds, MD, 50 mEq at 04/03/21 0032   thiamine tablet 100 mg, 100 mg, Per Tube, Daily, Mick Sell, PA-C  Facility-Administered Medications Ordered in Other Encounters:    midazolam (VERSED) 5 MG/5ML injection, , , Anesthesia Intra-op, Sammie Bench, CRNA, 2 mg at 04/11/18 1042  Exam: Current vital signs: BP (!) 150/82   Pulse 62   Temp (!) 86.9 F (30.5 C)   Resp (!) 28   Ht 5\' 7"  (1.702 m)   Wt 83.8 kg   SpO2 93%   BMI 28.94 kg/m  Vital signs in last 24 hours: Temp:  [80 F (26.7 C)-86.9 F (30.5 C)] 86.9 F (30.5 C) (09/24  0900) Pulse Rate:  [38-107] 62 (09/24 0900) Resp:  [22-28] 28 (09/24 0900) BP: (111-229)/(78-158) 150/82 (09/24 0900) SpO2:  [93 %-100 %] 93 % (09/24 0900) FiO2 (%):  [40 %-100 %] 40 % (09/24 0728) Weight:  [83.8 kg] 83.8 kg (09/24 0255)  GENERAL: Critically ill male. Intubated and sedated in the ICU. With stimulation he does become restless.   Psych: Unable to assess due to patient's condition Head: Normocephalic and atraumatic, without obvious abnormality EENT: Normal conjunctivae, arcus senilis, oral ETT in place, secured. LUNGS: Respirations assisted via mechanical ventilation.  Spontaneous respirations are present over set ventilator rate. CV: Regular rate on telemetry.  Left greater than right pedal edema, nonpitting. ABDOMEN: Soft, distended, non-rigid. OG in place tube, secured.  Extremities: Cool, without obvious deformity  NEURO:  Mental Status: Patient is intubated and sedated in the ICU.   He opens his eyes to voice and intermittently follows simple commands.   He is able to nod "yes" and shake "no" to examiner questions although unsure if he is reliable.  He nods yes to  being in the hospital, to his name correctly, and shakes "no" to answer that he is not currently at home. He does incorrectly nod "yes" to being 65 years old.  No neglect is noted. Cranial Nerves:  II: Left pupil is ovoid 4 mm and nonreactive to light, right pupil is 2 mm ovoid and briskly reactive to light. III, IV, VI: He is able to fixate and track examiner during examination. V: Unable to assess facial sensation to light touch due to patient's condition. VII: Face appears symmetric there was limited assessment due to ETT secured to face. VIII: Hearing is intact to voice IX, X: Cough and gag reflexes are present. XI: Head is grossly midline. XII: Patient does not protrude tongue to command. Motor: With stimulation, patient restlessly moves all extremities. He is able to elevate bilateral upper extremities against gravity.  Movements of bilateral upper extremities are myoclonic in appearance.  There is no myoclonus at rest.  Bilateral lower extremities unable to move without gravity and patient attempts antigravity movement without success.  Sensation: Patient nods "yes" to sensation to light touch intact and symmetric throughout extremity.  He does increase restless movements of each extremity with manipulation. Coordination: Unable to assess.  Patient does not follow complex commands. DTRs: 2+ and symmetric biceps, 1+ and symmetric patellae Gait: Deferred  Labs I have reviewed labs in epic and the results pertinent to this consultation are:CBC    Component Value Date/Time   WBC 0.7 (LL) 04/03/2021 0530   RBC 3.42 (L) 04/03/2021 0530   HGB 11.4 (L) 04/03/2021 0530   HGB 11.0 (L) 03/27/2018 1420   HCT 37.0 (L) 04/03/2021 0530   HCT 32.7 (L) 03/27/2018 1420   PLT 57 (L) 04/03/2021 0530   PLT 174 03/27/2018 1420   MCV 108.2 (H) 04/03/2021 0530   MCV 101 (H) 03/27/2018 1420   MCH 33.3 04/03/2021 0530   MCHC 30.8 04/03/2021 0530   RDW 18.3 (H) 04/03/2021 0530   RDW 14.6 03/27/2018  1420   LYMPHSABS 0.4 (L) 04/03/2021 0025   LYMPHSABS 0.9 03/27/2018 1420   MONOABS 0.3 04/03/2021 0025   EOSABS 0.0 04/03/2021 0025   EOSABS 0.1 03/27/2018 1420   BASOSABS 0.0 04/03/2021 0025   BASOSABS 0.0 03/27/2018 1420   CMP     Component Value Date/Time   NA 135 04/03/2021 0530   NA 140 05/15/2018 1632   K 5.6 (H) 04/03/2021 0530  CL 96 (L) 04/03/2021 0530   CO2 13 (L) 04/03/2021 0530   GLUCOSE 721 (HH) 04/03/2021 0530   BUN 94 (H) 04/03/2021 0530   BUN 38 (H) 05/15/2018 1632   CREATININE 18.17 (H) 04/03/2021 0530   CALCIUM 9.1 04/03/2021 0530   PROT 7.1 04/03/2021 0025   PROT 7.6 05/15/2018 1632   ALBUMIN 3.1 (L) 04/03/2021 0025   ALBUMIN 3.7 05/15/2018 1632   AST 29 04/03/2021 0025   ALT 18 04/03/2021 0025   ALKPHOS 55 04/03/2021 0025   BILITOT 0.7 04/03/2021 0025   BILITOT 0.3 05/15/2018 1632   GFRNONAA 3 (L) 04/03/2021 0530   GFRAA 6 (L) 01/18/2020 1232   Lipid Panel     Component Value Date/Time   CHOL 158 03/27/2018 1420   TRIG 74 03/27/2018 1420   HDL 74 03/27/2018 1420   CHOLHDL 2.1 03/27/2018 1420   CHOLHDL 2.0 Ratio 10/04/2007 2023   VLDL 31 10/04/2007 2023   LDLCALC 69 03/27/2018 1420   Lab Results  Component Value Date   CKTOTAL 815 (H) 04/03/2021   CKMB 18.0 (H) 04/03/2021   TROPONINI 0.04 (HH) 08/10/2018  Alcohol Level    Component Value Date/Time   ETH <10 04/03/2021 0530   Lactic Acid, Venous    Component Value Date/Time   LATICACIDVEN 5.1 (HH) 04/03/2021 0530   Lab Results  Component Value Date   TSH 4.029 01/25/2021   Imaging I have reviewed the images obtained:  CT-scan of the brain 04/03/2021: 1. No acute intracranial abnormality. 2. Stable atrophy and chronic small vessel ischemia.  EEG 9/24: "This study is suggestive of severe diffuse encephalopathy, nonspecific etiology. No seizures or epileptiform discharges were seen throughout the recording."  Assessment: 65 year old male with history as above who presented early  this morning after EMS found patient to be hypoglycemic, cold, unresponsive, with a weak and irregular pulse and following a 20-minute cardiac arrest with CPR in the field prior to ROSC.  Due to concern for myoclonus and possible anoxic brain injury, neurology was consulted for further evaluation. -Examination reveals patient opens eyes to voice, follows commands, has restless movements with stimulation, but remains intubated and sedated in the ICU.  Patient has visualized polymyoclonus with upper extremity stimulation that is not present at rest.   - CT head imaging is without acute intracranial abnormality. - Routine EEG suggestive of severe diffuse encephalopathy of nonspecific etiology without seizures or epileptiform discharges. - Work-up reveals: markedly elevated creatinine from baseline at 20.48 (baseline creatinine 8-9) with BUN elevation of 122 (baseline 35-50), CK elevation to 815, severe neutropenia, elevated phosphorous > 30, anion gap acidosis, potassium 5.6, lactic acid elevation to 5.1, troponin of 124, and rapid glucose correction from 22 to 721 overnight.  - Presentation was initially felt to be concerning for post anoxic myoclonus, which is a possibility but felt to be less likely as his mental status and level of alertness has improved and he is following commands.  Differential diagnosis also includes seizure activity in setting of severe toxic metabolic derangements/anoxic injury s/p cardiac arrest. Presentation of polymyoclonus is felt most likely to be the result of uremia with history of ESRD on HD and markedly abnormal labs as above.  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 Evaluate for hypoxic/anoxic brain injury-anoxic brain injury less likely given the examination Toxic metabolic encephalopathy  Recommendations: - Continue Keppra 500 mg BID for now, will consider discontinuing if patient improves following dialysis and improvement in labs - If  myoclonus continues, can consider  adding clonazepam - Would defer Depakote therapy for myoclonus in the setting of chronic liver disease - Continue EEG as patient is weaned from sedation - Inpatient seizure precautions - Management of uremia per nephrology  - Management of comorbid conditions per PCCM   Pt seen by NP/Neuro and later by MD. Note/plan to be edited by MD as needed.  Anibal Henderson, AGAC-NP [facility] Pager: [phone]   Attending addendum Patient seen and examined for abnormal movements postcardiac arrest. Agree with the history and physical documented above that I have independently also obtained. Agree with the plan above that I helped formulate. Plan was discussed with Dr. Verlee Monte on the unit.  -- Amie Portland, MD Neurologist [facility] Pager: [phone]  CRITICAL CARE ATTESTATION Performed by: Amie Portland, MD Total critical care time: 30 minutes Critical care time was exclusive of separately billable procedures and treating other patients and/or supervising APPs/Residents/Students Critical care was necessary to treat or prevent imminent or life-threatening deterioration due to polymyoclonus, postcardiac arrest, toxic metabolic encephalopathy This patient is critically ill and at significant risk for neurological worsening and/or death and care requires constant monitoring. Critical care was time spent personally by me on the following activities: development of treatment plan with patient and/or surrogate as well as nursing, discussions with consultants, evaluation of patient's response to treatment, examination of patient, obtaining history from patient or surrogate, ordering and performing treatments and interventions, ordering and review of laboratory studies, ordering and review of radiographic studies, pulse oximetry, re-evaluation of patient's condition, participation in multidisciplinary rounds and medical decision making of high  complexity in the care of this patient.

## 2021-04-03 NOTE — Progress Notes (Signed)
LTM started; no initial skin breakdown was seen; nurse educated on event button; tested with [facility].

## 2021-04-03 NOTE — ED Notes (Signed)
Patient transported to CT 

## 2021-04-03 NOTE — Procedures (Signed)
Patient Name: EDRAS WILFORD  MRN: [id number]  Epilepsy Attending: Lora Havens  Referring Physician/Provider: Dr Amie Portland Date: 04/03/2021 Duration: 22.05 mins  Patient history: 65yo M s/p cardiac arrest. EEG to evaluate for seizure  Level of alertness:  comatose  AEDs during EEG study: LEV  Technical aspects: This EEG study was done with scalp electrodes positioned according to the 10-20 International system of electrode placement. Electrical activity was acquired at a sampling rate of 500Hz  and reviewed with a high frequency filter of 70Hz  and a low frequency filter of 1Hz . EEG data were recorded continuously and digitally stored.   Description: EEG showed continuous generalized 3 to 5 Hz theta-delta slowing admixed with 15-18Hz  frontocentral beta activity. Hyperventilation and photic stimulation were not performed.     ABNORMALITY - Continuous slow, generalized  IMPRESSION: This study is suggestive of severe diffuse encephalopathy, nonspecific etiology. No seizures or epileptiform discharges were seen throughout the recording.  Marlette Curvin Barbra Sarks

## 2021-04-03 NOTE — ED Notes (Addendum)
Dr. Carson Myrtle told of pt lactic acid 6.1 and bladder temp 80.0 farenheit

## 2021-04-03 NOTE — Progress Notes (Signed)
Spoke with patient's sister, Neoma Laming, over phone. Updated on Raymond [name]'s condition.

## 2021-04-03 NOTE — Progress Notes (Signed)
eLink Physician-Brief Progress Note Patient Name: Raymond [name] DOB: 24-Apr-1956 MRN: [id number]   Date of Service  04/03/2021  HPI/Events of Note  Multiple issues: 1. Hypertension - BP = 177/109, 2. Hyperglycemia - Blood glucose = 267. Currently on D10W at 125 mL/hour and 3. QTc interval = 611 and HR = 39. Suspect that the prolonged QTc and bradycardia are related to severe hypothermia. Temp = 81.5 F.  eICU Interventions  Plan: Hydralazine 10 mg IV Q 4 hours PRN SBP > 160 or DBP > 100. Decrease D10W IV infusion to 50 mL/hour. Follow HR and QTc interval as patient continues to warm up.      Intervention Category Major Interventions: Hypertension - evaluation and management;Hyperglycemia - active titration of insulin therapy  Lysle Dingwall 04/03/2021, 4:14 AM

## 2021-04-03 NOTE — Progress Notes (Signed)
STAT EEG completed; results pending. [employer]eles[employer]ists notified. LTM pending.

## 2021-04-03 NOTE — Progress Notes (Signed)
Initial Nutrition Assessment  DOCUMENTATION CODES:   Not applicable  INTERVENTION:   Initiate Vital High Protein @ 30 ml/hr via OGT (480 ml daily)  90 ml Prosource TF TID.    Tube feeding regimen provides 960 kcal (100% of needs), 129 grams of protein, and 602 ml of H2O.    NUTRITION DIAGNOSIS:   Inadequate oral intake related to inability to eat as evidenced by NPO status.  GOAL:   Patient will meet greater than or equal to 90% of their needs  MONITOR:   Vent status, Weight trends, Labs, TF tolerance, Skin, I & O's  REASON FOR ASSESSMENT:   Consult Enteral/tube feeding initiation and management  ASSESSMENT:   This 65 y.o. African American male smoker presented to the [facility] Emergency Department via EMS with complaints of altered mental status.  A well-check request was called in by a family member, and EMS apparently found the patient to be unresponsive and hypoglycemic (22).  He subsequently experienced cardiac arrest in the field.  CPR was initiated with ROSC achieved after 20 min of CPR/ACLS efforts per verbal report (no further details at this time).  The patient was also noted to be hypothermic (rectal probe unable to register temp).  In the ER, the patient had [facility] airway exchanged to endotracheal tube (with RSI medications).  He was started on [employer] [facility].  Monitor showed junctional bradycardia.  PCCM asked to admit.  Pt admitted with cardiac arrest.   9/24- EEG revealed severe diffuse encephalopathy of non specific etiology  Patient is currently intubated on ventilator support. Pt with OGT, currently connected to low, intermittent suction.  MV: 14.4 L/min Temp (24hrs), Avg:82.7 F (28.2 C), Min:80 F (26.7 C), Max:88.3 F (31.3 C)  I/O's: +500 ml x 24 hours  UOP: 0 ml x 24 hours   MAP: 95  Per PCCM notes, plan to start HD; awaiting nephrology consult. 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   Medications reviewed and include colace, folvite, miralax, thiamine, dextrose 10% infusion @ 50 ml/hr, and sodium bicarbonate 150 mEq in dextrose 5% @ 50 ml/hr.   Labs reviewed: K: 5.6, Phos: >30, CBGS: 310-360 (inpatient orders for glycemic control are 0-6 units insulin aspart every 4 hours).    Diet Order:   Diet Order             Diet NPO time specified  Diet effective now                   EDUCATION NEEDS:   Not appropriate for education at this time  Skin:  Skin Assessment: Reviewed RN Assessment  Last BM:  Unknown  Height:   Ht Readings from Last 1 Encounters:  04/03/21 5\' 7"  (1.702 m)    Weight:   Wt Readings from Last 1 Encounters:  04/03/21 83.8 kg    Ideal Body Weight:  67.3 kg  BMI:  Body mass index is 28.94 kg/m.  Estimated Nutritional Needs:   Kcal:  841  Protein:  110-125 grams  Fluid:  per MD    Loistine Chance, RD, LDN, [place] Registered Dietitian II Certified Diabetes Care and Education Specialist Please refer to [facility] for RD and/or RD on-call/weekend/after hours pager

## 2021-04-03 NOTE — Progress Notes (Signed)
Pharmacy Antibiotic Note  Raymond [name] is a 65 y.o. male admitted on 04/03/2021 with  s/p cardiac arrest .  Pharmacy has been consulted for Zosyn dosing. Leukopenic. ESRD on HD. Markedly hypothermic.   Plan: Zosyn 2.25g IV q8h Trend WBC, temp F/U infectious work-up   Height: 5\' 7"  (170.2 cm) Weight: 83.8 kg (184 lb 11.9 oz) IBW/kg (Calculated) : 66.1  Temp (24hrs), Avg:81.3 F (27.4 C), Min:80 F (26.7 C), Max:83.3 F (28.5 C)  Recent Labs  Lab 04/03/21 0025 04/03/21 0029 04/03/21 0046 04/03/21 0530  WBC 1.5*  --   --  0.7*  CREATININE 20.48*  --  >18.00*  --   LATICACIDVEN  --  6.1*  --   --     CrCl cannot be calculated (This lab value cannot be used to calculate CrCl because it is not a number: >18.00).    No Known Allergies  Narda Bonds, PharmD, BCPS Clinical Pharmacist Phone: [phone]

## 2021-04-03 NOTE — ED Notes (Signed)
Bilateral IO access in bilateral shins via EMS

## 2021-04-03 NOTE — Consult Note (Signed)
[facility] Renal Consultation Note    Indication for Consultation:  Management of ESRD/hemodialysis; anemia, hypertension/volume and secondary hyperparathyroidism  CHE:NIDPO, Malva Limes., FNP  HPI: CELESTINE BOUGIE is a 65 y.o. male with ESRD on HD TTS at [facility]. His past medical history significant for coronary artery disease, COPD, ESRD with hemodialysis MWF, hepatitis C, essential hypertension, PAD, tobacco use, substance abuse, and type 2 diabetes melitis.  Patient presented to the ED s/p cardiac arrest. Reviewed previous notes. EMS was called by patient's family to perform wellness check. Upon arrival, patient was found unresponsive and hypoglycemia (BS 22). En route to ED, he cardiac arrested and 10mn of CPR was performed-patient then obtained ROSC. In ED, patient was intubated for airway protection. He was found hypothermic and bair hugger warmer was initiated. Patient admitted to critical care service for further evaluation of altered mental status. Neurology consulted. CXR shows improving vascular congestion and L perihilar opacities. Lab work includes: K+ 5.6, BUN 94, SrCr 18.17, Ca 9.1, PO4 >30, and Mg 2.3. Nephrology consulted for ESRD management. His last HD was on 03/25/21.  Past Medical History:  Diagnosis Date   Anemia    CAD (coronary artery disease)    a. 03/2018: cath showing 245to 30% stenosis along the LAD, luminal irregularities along the RCA, and angiographically normal LCx   COPD (chronic obstructive pulmonary disease) (HCC)    ESRD (end stage renal disease) on dialysis ([facility]    "MWF; HJeneen Rinks (07/22/17)   Hepatitis C    [facility] for 6 months. Most recent lab work was on 10/24/12   Hepatitis C    "took the tx; gone now" (12/05/2016)  Was treated   History of blood transfusion ~ 2012/2013   "related to my kidneys; blood was low"   Hypertension    Peripheral arterial disease ([place]     Substance abuse ([place]    Thyroid disease    Type 2 diabetes mellitus with other diabetic kidney complication ([place]/42/3536  Past Surgical History:  Procedure Laterality Date   ABDOMINAL AORTOGRAM W/LOWER EXTREMITY N/A 02/08/2018   Procedure: ABDOMINAL AORTOGRAM W/LOWER EXTREMITY;  Surgeon: CConrad Leeton MD;  [place];  Service: Cardiovascular;  Laterality: N/A;   ABDOMINAL AORTOGRAM W/LOWER EXTREMITY Bilateral 06/15/2020   Procedure: ABDOMINAL AORTOGRAM W/LOWER EXTREMITY;  Surgeon: CWaynetta Sandy MD;  [place];  Service: Cardiovascular;  Laterality: Bilateral;   AV FISTULA PLACEMENT Left Aug. 2013 ?   BIOPSY  01/17/2020   Procedure: BIOPSY;  Surgeon: GJackquline Denmark MD;  [place]  Service: Endoscopy;;   COLONOSCOPY WITH PROPOFOL N/A 08/21/2018   Procedure: COLONOSCOPY WITH PROPOFOL;  Surgeon: AYetta Flock MD;  [place]  Service: Gastroenterology;  Laterality: N/A;   ENTEROSCOPY N/A 01/17/2020   Procedure: ENTEROSCOPY;  Surgeon: GJackquline Denmark MD;  [facility];  Service: Endoscopy;  Laterality: N/A;   ENTEROSCOPY N/A 07/31/2020   Procedure: ENTEROSCOPY;  Surgeon: DDoran Stabler MD;  [place]  Service: Gastroenterology;  Laterality: N/A;   ENTEROSCOPY N/A 01/27/2021   Procedure: ENTEROSCOPY;  Surgeon: AYetta Flock MD;  [facility];  Service: Gastroenterology;  Laterality: N/A;   ESOPHAGOGASTRODUODENOSCOPY (EGD) WITH PROPOFOL N/A 08/20/2018   Procedure: ESOPHAGOGASTRODUODENOSCOPY (EGD) WITH PROPOFOL;  Surgeon: GGatha Mayer MD;  [place]  Service: Endoscopy;  Laterality: N/A;   FEMORAL-POPLITEAL BYPASS GRAFT Left  04/11/2018   Procedure: BYPASS GRAFT FEMORAL-POPLITEAL ARTERY LEFT LEG;  Surgeon: Rosetta Posner, MD;  Location: [facility] OR;  Service: Vascular;  Laterality: Left;   GIVENS CAPSULE STUDY N/A 07/31/2020   Procedure: GIVENS CAPSULE STUDY;   Surgeon: Doran Stabler, MD;  Location: [place];  Service: Gastroenterology;  Laterality: N/A;   HOT HEMOSTASIS N/A 08/20/2018   Procedure: HOT HEMOSTASIS (ARGON PLASMA COAGULATION/BICAP);  Surgeon: Gatha Mayer, MD;  Location: [facility] ENDOSCOPY;  Service: Endoscopy;  Laterality: N/A;   HOT HEMOSTASIS N/A 01/17/2020   Procedure: HOT HEMOSTASIS (ARGON PLASMA COAGULATION/BICAP);  Surgeon: Jackquline Denmark, MD;  Location[facility] ENDOSCOPY;  Service: Endoscopy;  Laterality: N/A;   HOT HEMOSTASIS N/A 01/27/2021   Procedure: HOT HEMOSTASIS (ARGON PLASMA COAGULATION/BICAP);  Surgeon: Yetta Flock, MD;  Location: [facility] ENDOSCOPY;  Service: Gastroenterology;  Laterality: N/A;   LEFT HEART CATH AND CORONARY ANGIOGRAPHY N/A 03/29/2018   Procedure: LEFT HEART CATH AND CORONARY ANGIOGRAPHY;  Surgeon: Nelva Bush, MD;  Location: [place] CV LAB;  Service: Cardiovascular;  Laterality: N/A;   LIVER BIOPSY     ORIF ULNAR FRACTURE Left 05/18/2017   Procedure: OPEN REDUCTION INTERNAL FIXATION (ORIF) ULNAR FRACTURE;  Surgeon: Iran Planas, MD;  Location: [place];  Service: Orthopedics;  Laterality: Left;   PERIPHERAL VASCULAR INTERVENTION Right 06/15/2020   Procedure: PERIPHERAL VASCULAR INTERVENTION;  Surgeon: Waynetta Sandy, MD;  Location: [place] CV LAB;  Service: Cardiovascular;  Laterality: Right;   POLYPECTOMY  08/21/2018   Procedure: POLYPECTOMY;  Surgeon: Yetta Flock, MD;  Location: [place];  Service: Gastroenterology;;   Earney Mallet N/A 12/11/2012   Procedure: Earney Mallet;  Surgeon: Serafina Mitchell, MD;  Location: [facility] CATH LAB;  Service: Cardiovascular;  Laterality: N/A;   WOUND EXPLORATION Left 06/15/2020   Procedure: GROIN  EXPLORATION;  Surgeon: Waynetta Sandy, MD;  Location: [facility] OR;  Service: Vascular;  Laterality: Left;   Family History  Problem Relation Age of Onset   Diabetes Father    Hypertension Father    Heart disease Father    Social History:  reports  that he has been smoking cigarettes. He has a 5.00 pack-year smoking history. He has never used smokeless tobacco. He reports current alcohol use of about 10.0 standard drinks per week. He reports that he does not currently use drugs after having used the following drugs: Cocaine. No Known Allergies Prior to Admission medications   Medication Sig Start Date End Date Taking? Authorizing Provider  acetaminophen (TYLENOL) 325 MG tablet Take 650 mg by mouth every 6 (six) hours as needed for mild pain.    [provider]  albuterol (VENTOLIN HFA) 108 (90 Base) MCG/ACT inhaler Inhale 2 puffs into the lungs every 4 (four) hours as needed for shortness of breath or wheezing. 11/06/19   [provider]  amiodarone (PACERONE) 200 MG tablet Take 1 tablet (200 mg total) by mouth daily. 12/11/20   Mercy Riding, MD  amiodarone (PACERONE) 200 MG tablet Take by mouth. 02/01/21   [provider]  amLODipine (NORVASC) 2.5 MG tablet Take 2.5 mg by mouth daily. Take 1 tablet by mouth every night    [provider]  carvedilol (COREG) 6.25 MG tablet Take 1 tablet (6.25 mg total) by mouth 2 (two) times daily with a meal. 12/11/20   Mercy Riding, MD  cinacalcet (SENSIPAR) 30 MG tablet Take 6 tablets (180 mg total) by mouth Every Tuesday,Thursday,and Saturday with dialysis. 12/12/20   Mercy Riding, MD  folic acid (FOLVITE) 1 MG tablet Take 1 tablet (1 mg total) by mouth daily. 12/12/20   Mercy Riding, MD  Methoxy PEG-Epoetin Beta (MIRCERA IJ) Mircera 02/02/21 02/01/22  [provider]  metoprolol succinate (TOPROL-XL) 50 MG 24 hr tablet Take 50 mg by mouth daily. Take 2 tablets by mouth every day with or immediately following a meal.    [provider]  Multiple Vitamin (MULTIVITAMIN WITH MINERALS) TABS tablet Take 1 tablet by mouth daily. 12/12/20   Mercy Riding, MD  nicotine (NICODERM CQ - DOSED IN MG/24 HOURS) 14 mg/24hr patch Place 1 patch (14 mg total) onto the skin daily.  12/12/20   Mercy Riding, MD  nicotine (NICODERM CQ) 14 mg/24hr patch Place onto the skin. 02/01/21   [provider]  octreotide (SANDOSTATIN LAR) 10 MG injection Inject 10 mg into the muscle every 28 (twenty-eight) days. 02/01/21   Armbruster, Carlota Raspberry, MD  pantoprazole (PROTONIX) 40 MG tablet Take 40 mg by mouth daily.    [provider]  prochlorperazine (COMPAZINE) 5 MG tablet Take 1 tablet (5 mg total) by mouth every 8 (eight) hours as needed for nausea or vomiting. 12/11/20   Mercy Riding, MD  QUEtiapine (SEROQUEL) 100 MG tablet Take 100 mg by mouth at bedtime. 01/25/21   [provider]  rosuvastatin (CRESTOR) 10 MG tablet Take 1 tablet (10 mg total) by mouth daily. 12/11/20 06/09/21  Mercy Riding, MD  sertraline (ZOLOFT) 25 MG tablet Take 25 mg by mouth daily. 02/24/21   [provider]  sevelamer carbonate (RENVELA) 800 MG tablet Take 4 tablets (3,200 mg total) by mouth 3 (three) times daily with meals. 09/14/18   Monica Becton, MD  thiamine 100 MG tablet Take 1 tablet (100 mg total) by mouth daily. 12/12/20   Mercy Riding, MD  apixaban (ELIQUIS) 5 MG TABS tablet Take 1 tablet (5 mg total) by mouth 2 (two) times daily. 06/22/20 08/03/20  Karoline Caldwell, PA-C   Current Facility-Administered Medications  Medication Dose Route Frequency Provider Last Rate Last Admin   0.9 %  sodium chloride infusion   Intra-arterial PRN Maryjane Hurter, MD       0.9 %  sodium chloride infusion   Intravenous PRN Maryjane Hurter, MD 10 mL/hr at 04/03/21 1300 Infusion Verify at 04/03/21 1300   0.9 %  sodium chloride infusion  100 mL Intravenous PRN Adelfa Koh, NP       0.9 %  sodium chloride infusion  100 mL Intravenous PRN Adelfa Koh, NP       alteplase (CATHFLO ACTIVASE) injection 2 mg  2 mg Intracatheter Once PRN Adelfa Koh, NP       budesonide (PULMICORT) nebulizer solution 0.25 mg  0.25 mg Nebulization BID Andres Labrum D, PA-C   0.25 mg at  04/03/21 0865   calcium chloride injection   Intravenous PRN Long, Wonda Olds, MD   1 g at 04/03/21 0031   chlorhexidine gluconate (MEDLINE KIT) (PERIDEX) 0.12 % solution 15 mL  15 mL Mouth Rinse BID Priscella Mann, RPH       Chlorhexidine Gluconate Cloth 2 % PADS 6 each  6 each Topical Q0600 Adelfa Koh, NP   6 each at 04/03/21 1200   dextrose 10 % infusion   Intravenous Continuous Anders Simmonds, MD 50 mL/hr at 04/03/21 0403 Rate Change at 04/03/21 0403   docusate (COLACE) 50 MG/5ML liquid 100 mg  100 mg  Per Tube BID Maryjane Hurter, MD       docusate sodium (COLACE) capsule 100 mg  100 mg Oral BID PRN Mick Sell, PA-C       EPINEPHrine (ADRENALIN) 1 MG/10ML injection   Intravenous PRN Long, Wonda Olds, MD   1 mg at 04/03/21 0030   feeding supplement (PROSource TF) liquid 90 mL  90 mL Per Tube TID Renee Pain, MD   90 mL at 04/03/21 1202   feeding supplement (VITAL HIGH PROTEIN) liquid 1,000 mL  1,000 mL Per Tube Q24H Renee Pain, MD       fentaNYL (SUBLIMAZE) injection 25 mcg  25 mcg Intravenous Q15 min PRN Maryjane Hurter, MD       fentaNYL (SUBLIMAZE) injection 25-100 mcg  25-100 mcg Intravenous Q30 min PRN Maryjane Hurter, MD   50 mcg at 69/50/72 2575   folic acid (FOLVITE) tablet 1 mg  1 mg Per Tube Daily Andres Labrum D, PA-C   1 mg at 04/03/21 1201   heparin injection 1,000 Units  1,000 Units Dialysis PRN Adelfa Koh, NP       heparin injection 5,000 Units  5,000 Units Subcutaneous Q8H Maryjane Hurter, MD       hydrALAZINE (APRESOLINE) injection 10 mg  10 mg Intravenous Q4H PRN Anders Simmonds, MD   10 mg at 04/03/21 0414   insulin aspart (novoLOG) injection 0-6 Units  0-6 Units Subcutaneous Q4H Andres Labrum D, PA-C   3 Units at 04/03/21 1201   ipratropium-albuterol (DUONEB) 0.5-2.5 (3) MG/3ML nebulizer solution 3 mL  3 mL Nebulization Q6H Andres Labrum D, PA-C   3 mL at 04/03/21 0727   levETIRAcetam (KEPPRA) 250 mg in sodium chloride 0.9 % 100 mL  IVPB  250 mg Intravenous Q12H Maryjane Hurter, MD       lidocaine (PF) (XYLOCAINE) 1 % injection 5 mL  5 mL Intradermal PRN Adelfa Koh, NP       lidocaine-prilocaine (EMLA) cream 1 application  1 application Topical PRN Adelfa Koh, NP       MEDLINE mouth rinse  15 mL Mouth Rinse 10 times per day Priscella Mann, RPH       pantoprazole (PROTONIX) injection 40 mg  40 mg Intravenous QHS Andres Labrum D, PA-C   40 mg at 04/03/21 0340   pentafluoroprop-tetrafluoroeth (GEBAUERS) aerosol 1 application  1 application Topical PRN Adelfa Koh, NP       piperacillin-tazobactam (ZOSYN) IVPB 2.25 g  2.25 g Intravenous Q8H Erenest Blank, RPH 100 mL/hr at 04/03/21 0657 2.25 g at 04/03/21 0657   polyethylene glycol (MIRALAX / GLYCOLAX) packet 17 g  17 g Oral Daily PRN Andres Labrum D, PA-C       polyethylene glycol (MIRALAX / GLYCOLAX) packet 17 g  17 g Per Tube Daily Maryjane Hurter, MD       propofol (DIPRIVAN) 1000 MG/100ML infusion  0-50 mcg/kg/min Intravenous Continuous Maryjane Hurter, MD 10.06 mL/hr at 04/03/21 1300 20 mcg/kg/min at 04/03/21 1300   sodium bicarbonate 150 mEq in dextrose 5 % 1,150 mL infusion   Intravenous Continuous Mick Sell, PA-C 30 mL/hr at 04/03/21 1300 Infusion Verify at 04/03/21 1300   sodium bicarbonate injection   Intravenous PRN Long, Wonda Olds, MD   50 mEq at 04/03/21 0032   thiamine tablet 100 mg  100 mg Per Tube Daily Andres Labrum D, PA-C   100 mg at 04/03/21 1201  Facility-Administered Medications Ordered in Other Encounters  Medication Dose Route Frequency Provider Last Rate Last Admin   midazolam (VERSED) 5 MG/5ML injection    Anesthesia Intra-op Sammie Bench, CRNA   2 mg at 04/11/18 1042   Labs: Basic Metabolic Panel: Recent Labs  Lab 04/03/21 0025 04/03/21 0046 04/03/21 0050 04/03/21 0340 04/03/21 0530  NA 142 139 149* 138 135  K 5.8* 6.0* 4.8 6.5* 5.6*  CL 103 110  --   --  96*  CO2 14*  --   --   --  13*   GLUCOSE 103* 96  --   --  721*  BUN 100* 122*  --   --  94*  CREATININE 20.48* >18.00*  --   --  18.17*  CALCIUM 9.3  --   --   --  9.1  PHOS  --   --   --   --  >30.0*   Liver Function Tests: Recent Labs  Lab 04/03/21 0025  AST 29  ALT 18  ALKPHOS 55  BILITOT 0.7  PROT 7.1  ALBUMIN 3.1*   Recent Labs  Lab 04/03/21 0025  LIPASE 58*   No results for input(s): AMMONIA in the last 168 hours. CBC: Recent Labs  Lab 04/03/21 0025 04/03/21 0046 04/03/21 0050 04/03/21 0340 04/03/21 0530  WBC 1.5*  --   --   --  0.7*  NEUTROABS 0.7*  --   --   --   --   HGB 11.6*   < > 10.5* 11.6* 11.4*  HCT 39.0   < > 31.0* 34.0* 37.0*  MCV 114.7*  --   --   --  108.2*  PLT 62*  --   --   --  57*   < > = values in this interval not displayed.   Cardiac Enzymes: Recent Labs  Lab 04/03/21 0530  CKTOTAL 815*  CKMB 18.0*   CBG: Recent Labs  Lab 04/03/21 0356 04/03/21 0723 04/03/21 0754 04/03/21 0912 04/03/21 1109  GLUCAP 267* 310* 346* 360* 290*   Iron Studies: No results for input(s): IRON, TIBC, TRANSFERRIN, FERRITIN in the last 72 hours. Studies/Results: CT HEAD WO CONTRAST (5MM)  Result Date: 04/03/2021 CLINICAL DATA:  Altered mental status. EXAM: CT HEAD WITHOUT CONTRAST TECHNIQUE: Contiguous axial images were obtained from the base of the skull through the vertex without intravenous contrast. COMPARISON:  Head CT and brain MRI 01/19/2021 FINDINGS: Brain: Stable degree of atrophy and chronic small vessel ischemia. No intracranial hemorrhage, mass effect, or midline shift. No hydrocephalus. The basilar cisterns are patent. No evidence of territorial infarct or acute ischemia. No extra-axial or intracranial fluid collection. Vascular: Atherosclerosis of skullbase vasculature without hyperdense vessel or abnormal calcification. Skull: No fracture or focal lesion. Sinuses/Orbits: Scattered mucosal thickening and opacification of the paranasal sinuses. No mastoid effusion. Patient is  intubated. Other: None. IMPRESSION: 1. No acute intracranial abnormality. 2. Stable atrophy and chronic small vessel ischemia. Electronically Signed   By: Keith Rake M.D.   On: 04/03/2021 01:56   DG CHEST PORT 1 VIEW  Result Date: 04/03/2021 CLINICAL DATA:  65 year old male status post orogastric tube placement. EXAM: PORTABLE CHEST - 1 VIEW COMPARISON:  Earlier the same day. FINDINGS: Interval insertion of gastric decompression tube with the tip in the left upper quadrant. Unchanged position of the indwelling endotracheal tube with the tip in the midthoracic trachea. Defibrillator pads remain in place. The mediastinal contours are within normal limits. Unchanged cardiomegaly. Interval increased scattered bilateral hazy  pulmonary opacities. No significant pleural effusion or evidence of pneumothorax. No acute osseous abnormality. IMPRESSION: 1. Interval insertion of gastric decompression tube with the tip in the left upper quadrant, likely within the stomach. 2. Interval development of mild pulmonary edema. 3. Unchanged cardiomegaly. Electronically Signed   By: Ruthann Cancer M.D.   On: 04/03/2021 11:31   DG Chest Port 1 View  Result Date: 04/03/2021 CLINICAL DATA:  Acute respiratory failure with hypercarbia. EXAM: PORTABLE CHEST 1 VIEW COMPARISON:  Radiograph 1 hour ago.  Chest CT 08/10/2018 FINDINGS: Endotracheal tube is grossly stable in positioning. Stable cardiomegaly. Vascular congestion is improving. Left perihilar opacities improving. No visualized pneumothorax or significant pleural effusion. Prominence of the right paratracheal stripe corresponds to vascular overlap on prior CT. IMPRESSION: 1. Improving vascular congestion and left perihilar opacities. 2. Stable cardiomegaly. Electronically Signed   By: Keith Rake M.D.   On: 04/03/2021 01:58   DG Chest Portable 1 View  Result Date: 04/03/2021 CLINICAL DATA:  Post CPR.  Intubation. EXAM: PORTABLE CHEST 1 VIEW COMPARISON:  Radiograph  01/25/2021 FINDINGS: Endotracheal tube tip is 4.1 cm from the carina. Chronic cardiomegaly. Aortic atherosclerosis. New vascular congestion. Left perihilar opacity. No pneumothorax or large pleural effusion. No acute osseous abnormalities are seen. IMPRESSION: 1. Endotracheal tube tip 4.1 cm from the carina. 2. Unchanged cardiomegaly. Vascular congestion. Left perihilar opacity may be atelectasis, pneumonia, or less likely asymmetric edema. Electronically Signed   By: Keith Rake M.D.   On: 04/03/2021 01:20   EEG adult  Result Date: 04/03/2021 Lora Havens, MD     04/03/2021  9:45 AM Patient Name: SCHUYLER OLDEN MRN: [id number] Epilepsy Attending: Lora Havens Referring Physician/Provider: Dr Amie [place] Date: 04/03/2021 Duration: 22.05 mins Patient history: 65yo M s/p cardiac arrest. EEG to evaluate for seizure Level of alertness:  comatose AEDs during EEG study: LEV Technical aspects: This EEG study was done with scalp electrodes positioned according to the 10-20 International system of electrode placement. Electrical activity was acquired at a sampling rate of _0  and reviewed with a high frequency filter of _1  and a low frequency filter of _2 . EEG data were recorded continuously and digitally stored. Description: EEG showed continuous generalized 3 to 5 Hz theta-delta slowing admixed with 15-_3  frontocentral beta activity. Hyperventilation and photic stimulation were not performed.   ABNORMALITY - Continuous slow, generalized IMPRESSION: This study is suggestive of severe diffuse encephalopathy, nonspecific etiology. No seizures or epileptiform discharges were seen throughout the recording. Priyanka O Yadav    ROS: Unable to assess; patient unable to answer questions at this time.   Physical Exam: Vitals:   04/03/21 1100 04/03/21 1126 04/03/21 1200 04/03/21 1300  BP: (!) 135/122 (!) 160/93 (!) 160/94 128/83  Pulse: 67 70 70 66  Resp: (!) 28 (!) 28 (!) 28 (!) 25  Temp: (!) 90.1 F  (32.3 C)  (!) 91.8 F (33.2 C) (!) 92.5 F (33.6 C)  TempSrc:      SpO2: 96% 98% 98% 100%  Weight:      Height:         General: Ill-appearing; on ventilator; NAD Lungs: CTA bilaterally. No wheeze, rales or rhonchi. Breathing is unlabored. Heart: RRR. No murmur, rubs or gallops.  Abdomen: soft, nontender, active bowel sounds Lower extremities: no edema BLLE Neuro: Opens eyes to voice; moves upper extremities spontaneously Dialysis Access: L AVF (+) Bruit/Thrill  Dialysis Orders:  TTS - [facility] 4hrs, BFR 450, DFR Auto flow 1.5,  EDW  69kg, 2K/ 2Ca Sensipar 163m with HD-last dose 03/25/21 Calcitriol 353m (recently raised) with HD-last dose (2.7536m on 03/25/21 Venofer 100m31m X 10 doses-hasn't been given yet.  Assessment/Plan: Acute Encephalopathy- Neurology following for possible anoxic brain injury. Continue gradual re-warming; plan for EEG S/P Cardiac Arrest-noted some purposeful movements; plan for TTE; managed by critical care service and neurology ESRD -  on HD TTS; doesn't appear volume overloaded; due to current HD censes, plan for HD tomorrow 9/25 first case. Hypertension/volume  - Blood pressures variable; doesn't appear volume overloaded. Plan for HD this evening.  Anemia of CKD - Hgb 11.4-ESA/Fe not indicated at this time  Secondary Hyperparathyroidism - Ca okay but PO4 very high-not sure how accurate this is-was 8.3 in outpatient on 03/25/21. Could try Renvela (powder form) only if patient has PO access.  Nutrition - Currently NPO  CourTobie Poet [facility] 04/03/2021, 1:49 PM

## 2021-04-03 NOTE — ED Notes (Signed)
Dr. Carson Myrtle told of pt troponin 124

## 2021-04-03 NOTE — ED Provider Notes (Signed)
Emergency Department Provider Note   I have reviewed the triage vital signs and the nursing notes.   HISTORY  Chief Complaint No chief complaint on file.   HPI Raymond [name] is a 65 y.o. male with past medical history reviewed below presents to the emergency department status post CPR with ROSC in the field.  EMS arrived to find the patient somnolent and altered.  They were able to get him to the ambulance at which point he lost pulses and required ACLS and [facility] airway placement.  They were able to achieve ROSC and patient was transported to the emergency department.   Level 5 caveat: unresponsive.   Past Medical History:  Diagnosis Date   Anemia    CAD (coronary artery disease)    a. 03/2018: cath showing 71 to 30% stenosis along the LAD, luminal irregularities along the RCA, and angiographically normal LCx   COPD (chronic obstructive pulmonary disease) (HCC)    ESRD (end stage renal disease) on dialysis [facility])    "MWF; Jeneen Rinks" (07/22/17)   Hepatitis C    Still positive s/p liver biopsy at [facility]  and interferon therapy for 6 months. Most recent lab work was on 10/24/12   Hepatitis C    "took the tx; gone now" (12/05/2016)  Was treated   History of blood transfusion ~ 2012/2013   "related to my kidneys; blood was low"   Hypertension    Peripheral arterial disease ([place])    Substance abuse ([place])    Thyroid disease    Type 2 diabetes mellitus with other diabetic kidney complication ([place]) 0/30/0923    Patient Active Problem List   Diagnosis Date Noted   Cardiac arrest ([place]) 04/03/2021   Anisocoria 04/03/2021   HFrEF (heart failure with reduced ejection fraction) ([place]) 04/03/2021   Hypothermia not due to cold exposure 04/03/2021   Junctional bradycardia 04/03/2021   Pancytopenia ([place]) 01/25/2021   AMS (altered mental status) 01/19/2021   Chronic viral hepatitis C ([place]) 12/25/2020   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 08/05/2020   Acute upper GI bleed  30/01/6225   Acute metabolic encephalopathy 33/35/4562   COVID-19 virus infection 07/29/2020   AVM (arteriovenous malformation) of stomach, acquired with hemorrhage    AVM (arteriovenous malformation) of small bowel, acquired with hemorrhage    Current use of Aubree Doody term anticoagulation    Sensorineural hearing loss (SNHL) of both ears 03/30/2020   History of arteriovenous malformation (AVM) 01/14/2020   UGIB (upper gastrointestinal bleed) 01/14/2020   Acute blood loss anemia 01/14/2020   Elevated troponin 01/14/2020   Allergy, unspecified, initial encounter 04/29/2019   Gastroenteritis 12/24/2018   Substance use disorder 12/24/2018   Type 2 diabetes mellitus with other diabetic kidney complication ([place]) 56/38/9373   Noncompliance of patient with renal dialysis ([place]) 09/12/2018   Benign neoplasm of colon    Melena    Anemia due to chronic kidney disease, on chronic dialysis ([place])    Anemia    H/O medication noncompliance    Polysubstance (excluding opioids) dependence ([place])    Atrial flutter with rapid ventricular response ([place]) 08/10/2018   PAF (paroxysmal atrial fibrillation) ([place]) 08/01/2018   Acute respiratory failure with hypoxia ([place]) 07/13/2018   Non-compliance with renal dialysis ([place]) 07/13/2018   PAD (peripheral artery disease) ([place]) 04/11/2018   Sepsis ([place]) 03/31/2018   Preop cardiovascular exam 03/29/2018   Idiopathic gout, unspecified site 12/13/2017   Other specified complication of vascular prosthetic devices, implants and grafts, initial encounter (  [place]) 11/03/2017   Volume overload 08/07/2017   Acute pulmonary edema (HCC)    Dyspnea 06/20/2017   Acute bronchitis    COPD (chronic obstructive pulmonary disease) ([place])    ESRD on hemodialysis ([place])    Hypoxia 12/05/2016   Hyperkalemia 12/19/2015   Hypoglycemia 12/19/2015   Polysubstance abuse ([place]) 12/19/2015   Homeless 12/19/2015   Anemia associated with stage 5 chronic renal failure ([place]) 12/19/2015   BPH  without urinary obstruction 10/29/2015   Erectile dysfunction 10/29/2015   Hematuria, gross 10/29/2015   Hypercalcemia 11/07/2014   Pruritus, unspecified 10/17/2014   Coagulation defect, unspecified ([place]) 09/17/2014   Atypical chest pain 12/19/2013   Thrombocytopenia, unspecified ([place]) 10/17/2013   Iron deficiency anemia, unspecified 08/28/2013   Atherosclerosis of native arteries of extremity with intermittent claudication ([place]) 12/04/2012   ESRD (end stage renal disease) ([place]) 12/04/2012   Hypertensive chronic kidney disease with stage 5 chronic kidney disease or end stage renal disease ([place]) 11/20/2012   Secondary hyperparathyroidism of renal origin ([place]) 11/20/2012   HEPATITIS C 09/07/2006   HYPERCHOLESTEROLEMIA 09/07/2006   HYPERTENSION, BENIGN SYSTEMIC 09/07/2006    Past Surgical History:  Procedure Laterality Date   ABDOMINAL AORTOGRAM W/LOWER EXTREMITY N/A 02/08/2018   Procedure: ABDOMINAL AORTOGRAM W/LOWER EXTREMITY;  Surgeon: Conrad Hornbeck, MD;  Location: [place] CV LAB;  Service: Cardiovascular;  Laterality: N/A;   ABDOMINAL AORTOGRAM W/LOWER EXTREMITY Bilateral 06/15/2020   Procedure: ABDOMINAL AORTOGRAM W/LOWER EXTREMITY;  Surgeon: Waynetta Sandy, MD;  Location: [place] CV LAB;  Service: Cardiovascular;  Laterality: Bilateral;   AV FISTULA PLACEMENT Left Aug. 2013 ?   BIOPSY  01/17/2020   Procedure: BIOPSY;  Surgeon: Jackquline Denmark, MD;  Location: [place];  Service: Endoscopy;;   COLONOSCOPY WITH PROPOFOL N/A 08/21/2018   Procedure: COLONOSCOPY WITH PROPOFOL;  Surgeon: Yetta Flock, MD;  Location: [place];  Service: Gastroenterology;  Laterality: N/A;   ENTEROSCOPY N/A 01/17/2020   Procedure: ENTEROSCOPY;  Surgeon: Jackquline Denmark, MD;  Location: [facility] ENDOSCOPY;  Service: Endoscopy;  Laterality: N/A;   ENTEROSCOPY N/A 07/31/2020   Procedure: ENTEROSCOPY;  Surgeon: Doran Stabler, MD;  Location: [place];  Service: Gastroenterology;   Laterality: N/A;   ENTEROSCOPY N/A 01/27/2021   Procedure: ENTEROSCOPY;  Surgeon: Yetta Flock, MD;  [facility] ENDOSCOPY;  Service: Gastroenterology;  Laterality: N/A;   ESOPHAGOGASTRODUODENOSCOPY (EGD) WITH PROPOFOL N/A 08/20/2018   Procedure: ESOPHAGOGASTRODUODENOSCOPY (EGD) WITH PROPOFOL;  Surgeon: Gatha Mayer, MD;  Location: [place];  Service: Endoscopy;  Laterality: N/A;   FEMORAL-POPLITEAL BYPASS GRAFT Left 04/11/2018   Procedure: BYPASS GRAFT FEMORAL-POPLITEAL ARTERY LEFT LEG;  Surgeon: Rosetta Posner, MD;  Location: [facility] OR;  Service: Vascular;  Laterality: Left;   GIVENS CAPSULE STUDY N/A 07/31/2020   Procedure: GIVENS CAPSULE STUDY;  Surgeon: Doran Stabler, MD;  Location: [place];  Service: Gastroenterology;  Laterality: N/A;   HOT HEMOSTASIS N/A 08/20/2018   Procedure: HOT HEMOSTASIS (ARGON PLASMA COAGULATION/BICAP);  Surgeon: Gatha Mayer, MD;  Location: [facility] ENDOSCOPY;  Service: Endoscopy;  Laterality: N/A;   HOT HEMOSTASIS N/A 01/17/2020   Procedure: HOT HEMOSTASIS (ARGON PLASMA COAGULATION/BICAP);  Surgeon: Jackquline Denmark, MD;  Location: [facility] ENDOSCOPY;  Service: Endoscopy;  Laterality: N/A;   HOT HEMOSTASIS N/A 01/27/2021   Procedure: HOT HEMOSTASIS (ARGON PLASMA COAGULATION/BICAP);  Surgeon: Yetta Flock, MD;  Location: [facility] ENDOSCOPY;  Service: Gastroenterology;  Laterality: N/A;   LEFT HEART CATH AND CORONARY ANGIOGRAPHY N/A 03/29/2018   Procedure: LEFT HEART CATH  AND CORONARY ANGIOGRAPHY;  Surgeon: Nelva Bush, MD;  Location: [place] CV LAB;  Service: Cardiovascular;  Laterality: N/A;   LIVER BIOPSY     ORIF ULNAR FRACTURE Left 05/18/2017   Procedure: OPEN REDUCTION INTERNAL FIXATION (ORIF) ULNAR FRACTURE;  Surgeon: Iran Planas, MD;  Location: [place];  Service: Orthopedics;  Laterality: Left;   PERIPHERAL VASCULAR INTERVENTION Right 06/15/2020   Procedure: PERIPHERAL VASCULAR INTERVENTION;  Surgeon: Waynetta Sandy, MD;  Location: [place] CV LAB;  Service: Cardiovascular;  Laterality: Right;   POLYPECTOMY  08/21/2018   Procedure: POLYPECTOMY;  Surgeon: Yetta Flock, MD;  Location: [place];  Service: Gastroenterology;;   Earney Mallet N/A 12/11/2012   Procedure: Earney Mallet;  Surgeon: Serafina Mitchell, MD;  Location: [facility] CATH LAB;  Service: Cardiovascular;  Laterality: N/A;   WOUND EXPLORATION Left 06/15/2020   Procedure: GROIN  EXPLORATION;  Surgeon: Waynetta Sandy, MD;  Location: [place];  Service: Vascular;  Laterality: Left;    Allergies Patient has no known allergies.  Family History  Problem Relation Age of Onset   Diabetes Father    Hypertension Father    Heart disease Father     Social History Social History   Tobacco Use   Smoking status: Every Day    Packs/day: 0.20    Years: 25.00    Pack years: 5.00    Types: Cigarettes    Last attempt to quit: 08/01/2011    Years since quitting: 9.6   Smokeless tobacco: Never   Tobacco comments:    he smokes 6 cigarettes a week  Vaping Use   Vaping Use: Never used  Substance Use Topics   Alcohol use: Yes    Alcohol/week: 10.0 standard drinks    Types: 10 Glasses of wine per week   Drug use: Not Currently    Types: Cocaine    Review of Systems  Level 5 caveat: Unresponsive   ____________________________________________   PHYSICAL EXAM:  VITAL SIGNS: Temp: 80 F Pulse: 44 Resp: 22 BP: 138/93 SPO2: 100% [place] airway.   Constitutional: Unresponsive and cold to touch. Some spontaneous respirations.  Eyes: Conjunctivae are normal.  Head: Atraumatic. Nose: No congestion/rhinnorhea. Mouth/Throat: Mucous membranes are dry.  Neck: No stridor.   Cardiovascular: Bradycardia. Good peripheral circulation. Grossly normal heart sounds.   Respiratory: Weak respiratory effort. Assisted with [facility] airway. Crackles at the bases.  Gastrointestinal: No distention.  Musculoskeletal: No gross deformities of extremities. Neurologic: Unresponsive  with spontaneous respirations and occasional posturing movements bilaterally.  Skin:  Skin is cool to touch, dry and intact. No rash noted.   ____________________________________________   LABS (all labs ordered are listed, but only abnormal results are displayed)  Labs Reviewed  COMPREHENSIVE METABOLIC PANEL - Abnormal; Notable for the following components:      Result Value   Potassium 5.8 (*)    CO2 14 (*)    Glucose, Bld 103 (*)    BUN 100 (*)    Creatinine, Ser 20.48 (*)    Albumin 3.1 (*)    GFR, Estimated 2 (*)    Anion gap 25 (*)    All other components within normal limits  LIPASE, BLOOD - Abnormal; Notable for the following components:   Lipase 58 (*)    All other components within normal limits  CBC WITH DIFFERENTIAL/PLATELET - Abnormal; Notable for the following components:   WBC 1.5 (*)    RBC 3.40 (*)    Hemoglobin 11.6 (*)    MCV 114.7 (*)  MCH 34.1 (*)    MCHC 29.7 (*)    RDW 18.6 (*)    Platelets 62 (*)    Neutro Abs 0.7 (*)    Lymphs Abs 0.4 (*)    All other components within normal limits  LACTIC ACID, PLASMA - Abnormal; Notable for the following components:   Lactic Acid, Venous 6.1 (*)    All other components within normal limits  LACTIC ACID, PLASMA - Abnormal; Notable for the following components:   Lactic Acid, Venous 5.1 (*)    All other components within normal limits  CBC - Abnormal; Notable for the following components:   WBC 0.7 (*)    RBC 3.42 (*)    Hemoglobin 11.4 (*)    HCT 37.0 (*)    MCV 108.2 (*)    RDW 18.3 (*)    Platelets 57 (*)    All other components within normal limits  BASIC METABOLIC PANEL - Abnormal; Notable for the following components:   Potassium 5.6 (*)    Chloride 96 (*)    CO2 13 (*)    Glucose, Bld 721 (*)    BUN 94 (*)    Creatinine, Ser 18.17 (*)    GFR, Estimated 3 (*)    Anion gap 26 (*)    All other components within normal limits  PHOSPHORUS - Abnormal; Notable for the following components:    Phosphorus >30.0 (*)    All other components within normal limits  ACETAMINOPHEN LEVEL - Abnormal; Notable for the following components:   Acetaminophen (Tylenol), Serum <10 (*)    All other components within normal limits  CK TOTAL AND CKMB (NOT AT [facility]) - Abnormal; Notable for the following components:   Total CK 815 (*)    CK, MB 18.0 (*)    All other components within normal limits  GLUCOSE, CAPILLARY - Abnormal; Notable for the following components:   Glucose-Capillary 234 (*)    All other components within normal limits  GLUCOSE, CAPILLARY - Abnormal; Notable for the following components:   Glucose-Capillary 267 (*)    All other components within normal limits  BASIC METABOLIC PANEL - Abnormal; Notable for the following components:   CO2 16 (*)    Glucose, Bld 329 (*)    BUN 104 (*)    Creatinine, Ser 19.63 (*)    GFR, Estimated 2 (*)    Anion gap 24 (*)    All other components within normal limits  BASIC METABOLIC PANEL - Abnormal; Notable for the following components:   Potassium 5.3 (*)    CO2 14 (*)    Glucose, Bld 224 (*)    BUN 111 (*)    Creatinine, Ser 20.24 (*)    Calcium 8.8 (*)    GFR, Estimated 2 (*)    Anion gap 26 (*)    All other components within normal limits  BASIC METABOLIC PANEL - Abnormal; Notable for the following components:   CO2 19 (*)    Glucose, Bld 173 (*)    BUN 115 (*)    Creatinine, Ser 20.00 (*)    GFR, Estimated 2 (*)    Anion gap 20 (*)    All other components within normal limits  VITAMIN B12 - Abnormal; Notable for the following components:   Vitamin B-12 2,869 (*)    All other components within normal limits  GLUCOSE, CAPILLARY - Abnormal; Notable for the following components:   Glucose-Capillary 310 (*)    All other components within normal  limits  GLUCOSE, CAPILLARY - Abnormal; Notable for the following components:   Glucose-Capillary 346 (*)    All other components within normal limits  BETA-HYDROXYBUTYRIC ACID -  Abnormal; Notable for the following components:   Beta-Hydroxybutyric Acid 1.65 (*)    All other components within normal limits  GLUCOSE, CAPILLARY - Abnormal; Notable for the following components:   Glucose-Capillary 360 (*)    All other components within normal limits  HEPATITIS B SURFACE ANTIBODY,QUALITATIVE - Abnormal; Notable for the following components:   Hep B S Ab Reactive (*)    All other components within normal limits  OSMOLALITY - Abnormal; Notable for the following components:   Osmolality 355 (*)    All other components within normal limits  PHOSPHORUS - Abnormal; Notable for the following components:   Phosphorus >30.1 (*)    All other components within normal limits  MAGNESIUM - Abnormal; Notable for the following components:   Magnesium 2.5 (*)    All other components within normal limits  GLUCOSE, CAPILLARY - Abnormal; Notable for the following components:   Glucose-Capillary 290 (*)    All other components within normal limits  BASIC METABOLIC PANEL - Abnormal; Notable for the following components:   Potassium 3.0 (*)    Glucose, Bld 126 (*)    BUN 39 (*)    Creatinine, Ser 8.18 (*)    GFR, Estimated 7 (*)    All other components within normal limits  GLUCOSE, CAPILLARY - Abnormal; Notable for the following components:   Glucose-Capillary 223 (*)    All other components within normal limits  TRIGLYCERIDES - Abnormal; Notable for the following components:   Triglycerides 649 (*)    All other components within normal limits  GLUCOSE, CAPILLARY - Abnormal; Notable for the following components:   Glucose-Capillary 185 (*)    All other components within normal limits  GLUCOSE, CAPILLARY - Abnormal; Notable for the following components:   Glucose-Capillary 106 (*)    All other components within normal limits  COMPREHENSIVE METABOLIC PANEL - Abnormal; Notable for the following components:   Potassium 3.3 (*)    Chloride 91 (*)    Glucose, Bld 387 (*)    BUN  51 (*)    Creatinine, Ser 10.60 (*)    Calcium 7.4 (*)    Total Protein 5.4 (*)    Albumin 2.2 (*)    GFR, Estimated 5 (*)    Anion gap 17 (*)    All other components within normal limits  CBC WITH DIFFERENTIAL/PLATELET - Abnormal; Notable for the following components:   WBC 3.2 (*)    RBC 3.28 (*)    Hemoglobin 11.1 (*)    HCT 33.3 (*)    MCV 101.5 (*)    RDW 17.9 (*)    Platelets 75 (*)    Lymphs Abs 0.2 (*)    All other components within normal limits  GLUCOSE, CAPILLARY - Abnormal; Notable for the following components:   Glucose-Capillary 62 (*)    All other components within normal limits  GLUCOSE, CAPILLARY - Abnormal; Notable for the following components:   Glucose-Capillary 106 (*)    All other components within normal limits  GLUCOSE, CAPILLARY - Abnormal; Notable for the following components:   Glucose-Capillary 66 (*)    All other components within normal limits  GLUCOSE, CAPILLARY - Abnormal; Notable for the following components:   Glucose-Capillary 102 (*)    All other components within normal limits  CBG MONITORING, ED - Abnormal; Notable  for the following components:   Glucose-Capillary 13 (*)    All other components within normal limits  CBG MONITORING, ED - Abnormal; Notable for the following components:   Glucose-Capillary 203 (*)    All other components within normal limits  I-STAT CHEM 8, ED - Abnormal; Notable for the following components:   Potassium 6.0 (*)    BUN 122 (*)    Creatinine, Ser >18.00 (*)    Calcium, Ion 0.97 (*)    TCO2 17 (*)    All other components within normal limits  I-STAT ARTERIAL BLOOD GAS, ED - Abnormal; Notable for the following components:   pH, Arterial 7.250 (*)    pCO2 arterial 68.5 (*)    pO2, Arterial 266 (*)    Bicarbonate 32.2 (*)    TCO2 35 (*)    Sodium 149 (*)    Calcium, Ion 1.80 (*)    HCT 31.0 (*)    Hemoglobin 10.5 (*)    All other components within normal limits  POCT I-STAT 7, (LYTES, BLD GAS,  ICA,H+H) - Abnormal; Notable for the following components:   pCO2 arterial 20.5 (*)    pO2, Arterial 44 (*)    Bicarbonate 14.1 (*)    TCO2 15 (*)    Acid-base deficit 12.0 (*)    Potassium 6.5 (*)    HCT 34.0 (*)    Hemoglobin 11.6 (*)    All other components within normal limits  POCT I-STAT 7, (LYTES, BLD GAS, ICA,H+H) - Abnormal; Notable for the following components:   pH, Arterial 7.570 (*)    pCO2 arterial 31.0 (*)    Bicarbonate 28.5 (*)    Acid-Base Excess 6.0 (*)    Calcium, Ion 1.02 (*)    HCT 35.0 (*)    Hemoglobin 11.9 (*)    All other components within normal limits  TROPONIN I (HIGH SENSITIVITY) - Abnormal; Notable for the following components:   Troponin I (High Sensitivity) 124 (*)    All other components within normal limits  TROPONIN I (HIGH SENSITIVITY) - Abnormal; Notable for the following components:   Troponin I (High Sensitivity) 130 (*)    All other components within normal limits  TROPONIN I (HIGH SENSITIVITY) - Abnormal; Notable for the following components:   Troponin I (High Sensitivity) 242 (*)    All other components within normal limits  TROPONIN I (HIGH SENSITIVITY) - Abnormal; Notable for the following components:   Troponin I (High Sensitivity) 291 (*)    All other components within normal limits  TROPONIN I (HIGH SENSITIVITY) - Abnormal; Notable for the following components:   Troponin I (High Sensitivity) 175 (*)    All other components within normal limits  RESP PANEL BY RT-PCR (FLU A&B, COVID) ARPGX2  CULTURE, BLOOD (ROUTINE X 2)  CULTURE, BLOOD (ROUTINE X 2)  MRSA NEXT GEN BY PCR, NASAL  CULTURE, RESPIRATORY W GRAM STAIN  ETHANOL  MAGNESIUM  HEMOGLOBIN A1C  FOLATE  TSH  T4, FREE  CORTISOL-AM, BLOOD  HEPATITIS B SURFACE ANTIGEN  GLUCOSE, CAPILLARY  MAGNESIUM  GLUCOSE, CAPILLARY  GLUCOSE, CAPILLARY  GLUCOSE, CAPILLARY  GLUCOSE, CAPILLARY  GLUCOSE, CAPILLARY  DRUG SCREEN 10 W/CONF, SERUM  BLOOD GAS, ARTERIAL  CALCIUM, IONIZED   PATHOLOGIST SMEAR REVIEW  HEPATITIS B SURFACE ANTIBODY, QUANTITATIVE  BLOOD GAS, ARTERIAL  CBC  COMPREHENSIVE METABOLIC PANEL  MAGNESIUM  PHOSPHORUS  CBG MONITORING, ED   ____________________________________________  EKG   EKG Interpretation  Date/Time:  Saturday April 03 2021 00:24:48 EDT Ventricular  Rate:  33 PR Interval:    QRS Duration: 131 QT Interval:  687 QTC Calculation: 509 R Axis:   6 Text Interpretation: Junctional rhythm Nonspecific intraventricular conduction delay Nonspecific T abnormalities, diffuse leads Confirmed by Nanda Quinton [phone]) on 04/03/2021 12:29:17 AM Also confirmed by Nanda Quinton [phone]), editor Ouida Sills [phone])  on 04/04/2021 9:51:12 AM        ____________________________________________  RADIOLOGY  CT head and CXR reviewed.   ____________________________________________   PROCEDURES  Procedure(s) performed:   Procedure Name: Intubation Date/Time: 04/05/2021 8:40 AM Performed by: Margette Fast, MD Pre-anesthesia Checklist: Patient identified, Patient being monitored, Emergency Drugs available, Timeout performed and Suction available Oxygen Delivery Method: Non-rebreather mask Preoxygenation: Pre-oxygenation with 100% oxygen Induction Type: Rapid sequence Ventilation: Oral airway inserted - appropriate to patient size Laryngoscope Size: Glidescope and 3 Grade View: Grade III Tube size: 7.5 mm Number of attempts: 1 Airway Equipment and Method: Video-laryngoscopy Placement Confirmation: ETT inserted through vocal cords under direct vision, CO2 detector and Breath sounds checked- equal and bilateral Secured at: 23 cm Tube secured with: ETT holder Dental Injury: Teeth and Oropharynx as per pre-operative assessment     .Critical Care Performed by: Margette Fast, MD Authorized by: Margette Fast, MD   Critical care provider statement:    Critical care time (minutes):  75   Critical care time was exclusive of:   Separately billable procedures and treating other patients and teaching time   Critical care was necessary to treat or prevent imminent or life-threatening deterioration of the following conditions:  Respiratory failure   Critical care was time spent personally by me on the following activities:  Discussions with consultants, evaluation of patient's response to treatment, examination of patient, ordering and performing treatments and interventions, ordering and review of laboratory studies, ordering and review of radiographic studies, pulse oximetry, re-evaluation of patient's condition, obtaining history from patient or surrogate, review of old charts, blood draw for specimens, development of treatment plan with patient or surrogate and ventilator management   I assumed direction of critical care for this patient from another provider in my specialty: no     Care discussed with: admitting provider     ____________________________________________   INITIAL IMPRESSION / [place] / ED COURSE  Pertinent labs & imaging results that were available during my care of the patient were reviewed by me and considered in my medical decision making (see chart for details).   Patient presents emergency department after CPR and ROSC in the field.  He arrives bradycardic and hypothermic.  He is very hypoglycemic.  This was replaced on arrival and [facility] airway was switched for endotracheal tube without difficulty.  EKG appears junctional.  Patient given additional bicarb, calcium, 1 dose of epinephrine and bradycardia/junctional rhythm seem to narrow with improved heart rate.  Patient not requiring pressors to maintain blood pressure.   Labs show hyperkalemia.  Patient is dialysis patient and this may be contributing to his junctional rhythm.  Plan for additional shifting although patient is hypoglycemic.  Will need to improve blood sugar so that insulin can be given for K shift.   CT head with no acute  changes.  Patient under warming protocol.  EKG shows no acute ischemic change.  Discussed patient's case with ICU to request admission. Patient and family (if present) updated with plan. Care transferred to ICU service.  I reviewed all nursing notes, vitals, pertinent old records, EKGs, labs, imaging (as available).    ____________________________________________  FINAL CLINICAL IMPRESSION(S) / ED DIAGNOSES  Final diagnoses:  Cardiac arrest ([place])  Hyperkalemia  Hypothermia, initial encounter     MEDICATIONS GIVEN DURING THIS VISIT:  Medications  dextrose 10 % infusion ( Intravenous Infusion Verify 04/05/21 0800)  calcium chloride injection (1 g Intravenous Given 04/03/21 0031)  docusate sodium (COLACE) capsule 100 mg (has no administration in time range)  polyethylene glycol (MIRALAX / GLYCOLAX) packet 17 g (has no administration in time range)  pantoprazole (PROTONIX) injection 40 mg (40 mg Intravenous Given 04/04/21 2120)  budesonide (PULMICORT) nebulizer solution 0.25 mg (0.25 mg Nebulization Given 04/05/21 0804)  insulin aspart (novoLOG) injection 0-6 Units (0 Units Subcutaneous Not Given 04/05/21 0744)  hydrALAZINE (APRESOLINE) injection 10 mg (10 mg Intravenous Given 04/03/21 0414)  piperacillin-tazobactam (ZOSYN) IVPB 2.25 g (0 g Intravenous Stopped 04/05/21 0557)  0.9 %  sodium chloride infusion ( Intravenous Infusion Verify 04/05/21 0800)  Chlorhexidine Gluconate Cloth 2 % PADS 6 each (6 each Topical Given 04/04/21 1121)  pentafluoroprop-tetrafluoroeth (GEBAUERS) aerosol 1 application (has no administration in time range)  lidocaine (PF) (XYLOCAINE) 1 % injection 5 mL (has no administration in time range)  lidocaine-prilocaine (EMLA) cream 1 application (has no administration in time range)  0.9 %  sodium chloride infusion (has no administration in time range)  0.9 %  sodium chloride infusion (has no administration in time range)  heparin injection 1,000 Units (has no  administration in time range)  alteplase (CATHFLO ACTIVASE) injection 2 mg (has no administration in time range)  heparin injection 5,000 Units (5,000 Units Subcutaneous Given 04/05/21 0527)  chlorhexidine gluconate (MEDLINE KIT) (PERIDEX) 0.12 % solution 15 mL (15 mLs Mouth Rinse Given 04/05/21 0744)  thiamine tablet 100 mg (has no administration in time range)  folic acid (FOLVITE) tablet 1 mg (has no administration in time range)  ipratropium-albuterol (DUONEB) 0.5-2.5 (3) MG/3ML nebulizer solution 3 mL (3 mLs Nebulization Given 04/05/21 0803)  dextrose 50 % solution (  Given 04/03/21 0019)  sodium chloride 0.9 % bolus 500 mL (0 mLs Intravenous Stopped 04/03/21 0222)  albuterol (PROVENTIL) (2.5 MG/3ML) 0.083% nebulizer solution 7.5 mg (7.5 mg Nebulization Given 04/03/21 0318)  levETIRAcetam (KEPPRA) IVPB 1000 mg/100 mL premix (0 mg Intravenous Stopped 04/03/21 0942)  potassium chloride 10 mEq in 100 mL IVPB (10 mEq Intravenous New Bag/Given 04/04/21 0553)  atorvastatin (LIPITOR) tablet 80 mg (80 mg Per NG tube Given 04/04/21 0613)  magnesium sulfate IVPB 1 g 100 mL (0 g Intravenous Stopped 04/04/21 0740)  aspirin chewable tablet 324 mg (324 mg Per Tube Given 04/04/21 0931)  dextrose 50 % solution 12.5 g (12.5 g Intravenous Given 04/04/21 1126)  dextrose 50 % solution (  Duplicate 3/61/22 4497)  dextrose 50 % solution 12.5 g (12.5 g Intravenous Given 04/04/21 1925)    Note:  This document was prepared using Dragon voice recognition software and may include unintentional dictation errors.  Nanda Quinton, MD, [facility]    Mailey Landstrom, [facility], MD 04/05/21 [phone]

## 2021-04-03 NOTE — ED Triage Notes (Signed)
EMS called for welfare check. Pt found with CBG 22, cold, unresonsive with weak, irregular pulse. Apneic and pulseless in truck, CPR ensued with EMS for 20 minutes. ROSC at 2348.   3 epi D10 397mL fluid

## 2021-04-04 ENCOUNTER — Inpatient Hospital Stay (HOSPITAL_COMMUNITY): Payer: 59

## 2021-04-04 ENCOUNTER — Other Ambulatory Visit (HOSPITAL_COMMUNITY): Payer: 59

## 2021-04-04 DIAGNOSIS — R9431 Abnormal electrocardiogram [ECG] [EKG]: Secondary | ICD-10-CM

## 2021-04-04 DIAGNOSIS — I469 Cardiac arrest, cause unspecified: Secondary | ICD-10-CM

## 2021-04-04 DIAGNOSIS — N186 End stage renal disease: Secondary | ICD-10-CM | POA: Diagnosis not present

## 2021-04-04 DIAGNOSIS — R001 Bradycardia, unspecified: Secondary | ICD-10-CM | POA: Diagnosis not present

## 2021-04-04 LAB — ECHOCARDIOGRAM COMPLETE
Area-P 1/2: 3.68 cm2
Calc EF: 51.6 %
Height: 67 in
S' Lateral: 3.4 cm
Single Plane A2C EF: 54.2 %
Single Plane A4C EF: 47.1 %
Weight: 2772.5 oz

## 2021-04-04 LAB — POCT I-STAT 7, (LYTES, BLD GAS, ICA,H+H)
Acid-Base Excess: 6 mmol/L — ABNORMAL HIGH (ref 0.0–2.0)
Bicarbonate: 28.5 mmol/L — ABNORMAL HIGH (ref 20.0–28.0)
Calcium, Ion: 1.02 mmol/L — ABNORMAL LOW (ref 1.15–1.40)
HCT: 35 % — ABNORMAL LOW (ref 39.0–52.0)
Hemoglobin: 11.9 g/dL — ABNORMAL LOW (ref 13.0–17.0)
O2 Saturation: 98 %
Patient temperature: 97.9
Potassium: 4.2 mmol/L (ref 3.5–5.1)
Sodium: 136 mmol/L (ref 135–145)
TCO2: 29 mmol/L (ref 22–32)
pCO2 arterial: 31 mmHg — ABNORMAL LOW (ref 32.0–48.0)
pH, Arterial: 7.57 — ABNORMAL HIGH (ref 7.350–7.450)
pO2, Arterial: 91 mmHg (ref 83.0–108.0)

## 2021-04-04 LAB — GLUCOSE, CAPILLARY
Glucose-Capillary: 95 mg/dL (ref 70–99)
Glucose-Capillary: 94 mg/dL (ref 70–99)
Glucose-Capillary: 62 mg/dL — ABNORMAL LOW (ref 70–99)
Glucose-Capillary: 106 mg/dL — ABNORMAL HIGH (ref 70–99)
Glucose-Capillary: 82 mg/dL (ref 70–99)
Glucose-Capillary: 66 mg/dL — ABNORMAL LOW (ref 70–99)
Glucose-Capillary: 102 mg/dL — ABNORMAL HIGH (ref 70–99)
Glucose-Capillary: 76 mg/dL (ref 70–99)

## 2021-04-04 LAB — COMPREHENSIVE METABOLIC PANEL
ALT: 18 U/L (ref 0–44)
AST: 19 U/L (ref 15–41)
Albumin: 2.2 g/dL — ABNORMAL LOW (ref 3.5–5.0)
Alkaline Phosphatase: 42 U/L (ref 38–126)
Anion gap: 17 — ABNORMAL HIGH (ref 5–15)
BUN: 51 mg/dL — ABNORMAL HIGH (ref 8–23)
CO2: 27 mmol/L (ref 22–32)
Calcium: 7.4 mg/dL — ABNORMAL LOW (ref 8.9–10.3)
Chloride: 91 mmol/L — ABNORMAL LOW (ref 98–111)
Creatinine, Ser: 10.6 mg/dL — ABNORMAL HIGH (ref 0.61–1.24)
GFR, Estimated: 5 mL/min — ABNORMAL LOW (ref >60)
Glucose, Bld: 387 mg/dL — ABNORMAL HIGH (ref 70–99)
Potassium: 3.3 mmol/L — ABNORMAL LOW (ref 3.5–5.1)
Sodium: 135 mmol/L (ref 135–145)
Total Bilirubin: 0.9 mg/dL (ref 0.3–1.2)
Total Protein: 5.4 g/dL — ABNORMAL LOW (ref 6.5–8.1)

## 2021-04-04 LAB — CBC WITH DIFFERENTIAL/PLATELET
Abs Immature Granulocytes: 0.02 10*3/uL (ref 0.00–0.07)
Basophils Absolute: 0 10*3/uL (ref 0.0–0.1)
Basophils Relative: 0 %
Eosinophils Absolute: 0 10*3/uL (ref 0.0–0.5)
Eosinophils Relative: 0 %
HCT: 33.3 % — ABNORMAL LOW (ref 39.0–52.0)
Hemoglobin: 11.1 g/dL — ABNORMAL LOW (ref 13.0–17.0)
Immature Granulocytes: 1 %
Lymphocytes Relative: 5 %
Lymphs Abs: 0.2 10*3/uL — ABNORMAL LOW (ref 0.7–4.0)
MCH: 33.8 pg (ref 26.0–34.0)
MCHC: 33.3 g/dL (ref 30.0–36.0)
MCV: 101.5 fL — ABNORMAL HIGH (ref 80.0–100.0)
Monocytes Absolute: 0.5 10*3/uL (ref 0.1–1.0)
Monocytes Relative: 15 %
Neutro Abs: 2.5 10*3/uL (ref 1.7–7.7)
Neutrophils Relative %: 79 %
Platelets: 75 10*3/uL — ABNORMAL LOW (ref 150–400)
RBC: 3.28 MIL/uL — ABNORMAL LOW (ref 4.22–5.81)
RDW: 17.9 % — ABNORMAL HIGH (ref 11.5–15.5)
WBC: 3.2 10*3/uL — ABNORMAL LOW (ref 4.0–10.5)
nRBC: 0 % (ref 0.0–0.2)

## 2021-04-04 LAB — TRIGLYCERIDES: Triglycerides: 649 mg/dL — ABNORMAL HIGH (ref <150)

## 2021-04-04 LAB — BASIC METABOLIC PANEL
Anion gap: 14 (ref 5–15)
BUN: 39 mg/dL — ABNORMAL HIGH (ref 8–23)
CO2: 24 mmol/L (ref 22–32)
Calcium: 9 mg/dL (ref 8.9–10.3)
Chloride: 99 mmol/L (ref 98–111)
Creatinine, Ser: 8.18 mg/dL — ABNORMAL HIGH (ref 0.61–1.24)
GFR, Estimated: 7 mL/min — ABNORMAL LOW (ref >60)
Glucose, Bld: 126 mg/dL — ABNORMAL HIGH (ref 70–99)
Potassium: 3 mmol/L — ABNORMAL LOW (ref 3.5–5.1)
Sodium: 137 mmol/L (ref 135–145)

## 2021-04-04 LAB — TROPONIN I (HIGH SENSITIVITY)
Troponin I (High Sensitivity): 291 ng/L (ref <18)
Troponin I (High Sensitivity): 175 ng/L (ref <18)

## 2021-04-04 LAB — MAGNESIUM: Magnesium: 1.9 mg/dL (ref 1.7–2.4)

## 2021-04-04 MED ORDER — ASPIRIN 81 MG PO CHEW
324.0000 mg | CHEWABLE_TABLET | Freq: Once | ORAL | Status: AC
  Administered 2021-04-04: 324 mg
  Filled 2021-04-04: qty 4

## 2021-04-04 MED ORDER — ATORVASTATIN CALCIUM 40 MG PO TABS
80.0000 mg | ORAL_TABLET | Freq: Once | ORAL | Status: AC
  Administered 2021-04-04: 80 mg via NASOGASTRIC
  Filled 2021-04-04: qty 2

## 2021-04-04 MED ORDER — MAGNESIUM SULFATE IN D5W 1-5 GM/100ML-% IV SOLN
1.0000 g | Freq: Once | INTRAVENOUS | Status: AC
  Administered 2021-04-04: 1 g via INTRAVENOUS
  Filled 2021-04-04: qty 100

## 2021-04-04 MED ORDER — POTASSIUM CHLORIDE 10 MEQ/100ML IV SOLN
10.0000 meq | INTRAVENOUS | Status: AC
  Administered 2021-04-04 (×2): 10 meq via INTRAVENOUS
  Filled 2021-04-04 (×2): qty 100

## 2021-04-04 MED ORDER — THIAMINE HCL 100 MG PO TABS
100.0000 mg | ORAL_TABLET | Freq: Every day | ORAL | Status: DC
  Administered 2021-04-05 – 2021-04-15 (×10): 100 mg via ORAL
  Filled 2021-04-04 (×10): qty 1

## 2021-04-04 MED ORDER — IPRATROPIUM-ALBUTEROL 0.5-2.5 (3) MG/3ML IN SOLN
3.0000 mL | Freq: Two times a day (BID) | RESPIRATORY_TRACT | Status: DC
  Administered 2021-04-04 – 2021-04-10 (×11): 3 mL via RESPIRATORY_TRACT
  Filled 2021-04-04 (×12): qty 3

## 2021-04-04 MED ORDER — DEXTROSE 50 % IV SOLN
12.5000 g | INTRAVENOUS | Status: AC
  Administered 2021-04-04: 12.5 g via INTRAVENOUS

## 2021-04-04 MED ORDER — DEXTROSE 50 % IV SOLN
INTRAVENOUS | Status: AC
  Filled 2021-04-04: qty 50

## 2021-04-04 MED ORDER — DEXTROSE 50 % IV SOLN: 12.5000 g | INTRAVENOUS | Status: AC

## 2021-04-04 MED ORDER — DEXTROSE 50 % IV SOLN
INTRAVENOUS | Status: AC
  Administered 2021-04-04: 12.5 g via INTRAVENOUS
  Filled 2021-04-04: qty 50

## 2021-04-04 MED ORDER — FOLIC ACID 1 MG PO TABS
1.0000 mg | ORAL_TABLET | Freq: Every day | ORAL | Status: DC
  Administered 2021-04-05 – 2021-04-15 (×10): 1 mg via ORAL
  Filled 2021-04-04 (×10): qty 1

## 2021-04-04 NOTE — Plan of Care (Signed)

## 2021-04-04 NOTE — Progress Notes (Signed)
  Echocardiogram 2D Echocardiogram has been performed.  Raymond [name] 04/04/2021, 10:10 AM

## 2021-04-04 NOTE — Progress Notes (Addendum)
eLink Physician-Brief Progress Note Patient Name: Raymond [name] DOB: [birth year] MRN: [id number]   Date of Service  04/04/2021  HPI/Events of Note  On vent with OG/PIV's, S/P HD last night with 2.8 liters off,  has K+ 3.0 this AM  eICU Interventions  - replace Kcl. - troponin at 291, up from 242 - follow troponin/K level at 8 AM.       Intervention Category Minor Interventions: Electrolytes abnormality - evaluation and management  Elmer Sow 04/04/2021, 3:51 AM  4:34 EKG stat reviewed. New antero lateral ST T changes. No STEMI. LVH- T wave down. Prolonged qtc > 600 compared to 24 th AM . EF 45% from 01/2021.  Camera : Discussed with RN. MAP is good. On bicarb gtt at 30 ml/hr. AM labs due for 5 AM  Plan: Get mag stat. Send AM labs (CMP/CBC, ABG)stat to see any worsening of thrombocytopenia/anemia.  Kcl IV ordered earlier is about to start.  -due to low platelet and anemia, avoiding heparin gtt. Or asa at this time. Cardiology to see in AM. ECHO for AM ordered; echo 9/24: not done yet. Not on epic.  If LFT is ok, to get Lipitor 80 via NG tube once for NSTEMI.

## 2021-04-04 NOTE — Progress Notes (Addendum)
[facility] Progress Note   Subjective:    Patient seen and examined at bedside. Appears comfortable. Tolerated yesterday's HD with net UF 2.8L. Plan for HD 9/27.  Objective Vitals:   04/04/21 0915 04/04/21 0945 04/04/21 1000 04/04/21 1015  BP: (!) 108/47 (!) 115/102 (!) 120/52 (!) 114/53  Pulse: 86 87 85 85  Resp: _0 Temp: (!) 97.2 F (36.2 C) (!) 97.2 F (36.2 C) (!) 97.3 F (36.3 C) (!) 97.3 F (36.3 C)  TempSrc:      SpO2: 100% 100% 100% 100%  Weight:      Height:       Physical Exam General: Ill-appearing; on ventilator; NAD Lungs: CTA bilaterally. No wheeze, rales or rhonchi. Breathing is unlabored. Heart: RRR. No murmur, rubs or gallops.  Abdomen: soft, nontender, active bowel sounds Lower extremities: no edema BLLE Neuro: Opens eyes to voice; moves upper extremities spontaneously Dialysis Access: L AVF (+) Bruit/Thrill  Filed Weights   04/03/21 0255 04/04/21 0400  Weight: 83.8 kg 78.6 kg    Intake/Output Summary (Last 24 hours) at 04/04/2021 1025 Last data filed at 04/04/2021 1000 Gross per 24 hour  Intake 2513.98 ml  Output 2854 ml  Net -340.02 ml    Additional Objective Labs: Basic Metabolic Panel: Recent Labs  Lab 04/03/21 0530 04/03/21 1059 04/03/21 1629 04/03/21 2100 04/04/21 0113 04/04/21 0500 04/04/21 0638  NA 135 140   < > 141 137 135 136  K 5.6* 5.1   < > 5.1 3.0* 3.3* 4.2  CL 96* 100   < > 102 99 91*  --   CO2 13* 16*   < > 19* 24 27  --   GLUCOSE 721* 329*   < > 173* 126* 387*  --   BUN 94* 104*   < > 115* 39* 51*  --   CREATININE 18.17* 19.63*   < > 20.00* 8.18* 10.60*  --   CALCIUM 9.1 9.0   < > 8.9 9.0 7.4*  --   PHOS >30.0* >30.1*  --   --   --   --   --    < > = values in this interval not displayed.   Liver Function Tests: Recent Labs  Lab 04/03/21 0025 04/04/21 0500  AST 29 19  ALT 18 18  ALKPHOS 55 42  BILITOT 0.7 0.9  PROT 7.1 5.4*  ALBUMIN 3.1* 2.2*   Recent Labs  Lab 04/03/21 0025   LIPASE 58*   CBC: Recent Labs  Lab 04/03/21 0025 04/03/21 0046 04/03/21 0530 04/04/21 0500 04/04/21 0638  WBC 1.5*  --  0.7* 3.2*  --   NEUTROABS 0.7*  --   --  2.5  --   HGB 11.6*   < > 11.4* 11.1* 11.9*  HCT 39.0   < > 37.0* 33.3* 35.0*  MCV 114.7*  --  108.2* 101.5*  --   PLT 62*  --  57* 75*  --    < > = values in this interval not displayed.   Blood Culture    Component Value Date/Time   SDES BLOOD RIGHT HAND 06/21/2020 0239   SPECREQUEST  06/21/2020 0239    BOTTLES DRAWN AEROBIC AND ANAEROBIC Blood Culture adequate volume   CULT  06/21/2020 0239    NO GROWTH 5 DAYS Performed at [facility], [place] [address], [place], [place] [address]    REPTSTATUS 06/26/2020 FINAL 06/21/2020 0239    Cardiac Enzymes: Recent Labs  Lab  04/03/21 0530  CKTOTAL 815*  CKMB 18.0*   CBG: Recent Labs  Lab 04/03/21 1509 04/03/21 1909 04/03/21 2325 04/04/21 0314 04/04/21 0709  GLUCAP 223* 185* 106* 95 94   Iron Studies: No results for input(s): IRON, TIBC, TRANSFERRIN, FERRITIN in the last 72 hours. Lab Results  Component Value Date   INR 1.5 (H) 07/29/2020   INR 1.5 (H) 07/28/2020   INR 1.23 08/19/2018   Studies/Results: CT HEAD WO CONTRAST (5MM)  Result Date: 04/03/2021 CLINICAL DATA:  Altered mental status. EXAM: CT HEAD WITHOUT CONTRAST TECHNIQUE: Contiguous axial images were obtained from the base of the skull through the vertex without intravenous contrast. COMPARISON:  Head CT and brain MRI 01/19/2021 FINDINGS: Brain: Stable degree of atrophy and chronic small vessel ischemia. No intracranial hemorrhage, mass effect, or midline shift. No hydrocephalus. The basilar cisterns are patent. No evidence of territorial infarct or acute ischemia. No extra-axial or intracranial fluid collection. Vascular: Atherosclerosis of skullbase vasculature without hyperdense vessel or abnormal calcification. Skull: No fracture or focal lesion. Sinuses/Orbits: Scattered mucosal thickening  and opacification of the paranasal sinuses. No mastoid effusion. Patient is intubated. Other: None. IMPRESSION: 1. No acute intracranial abnormality. 2. Stable atrophy and chronic small vessel ischemia. Electronically Signed   By: Keith Rake M.D.   On: 04/03/2021 01:56   DG CHEST PORT 1 VIEW  Result Date: 04/03/2021 CLINICAL DATA:  65 year old male status post orogastric tube placement. EXAM: PORTABLE CHEST - 1 VIEW COMPARISON:  Earlier the same day. FINDINGS: Interval insertion of gastric decompression tube with the tip in the left upper quadrant. Unchanged position of the indwelling endotracheal tube with the tip in the midthoracic trachea. Defibrillator pads remain in place. The mediastinal contours are within normal limits. Unchanged cardiomegaly. Interval increased scattered bilateral hazy pulmonary opacities. No significant pleural effusion or evidence of pneumothorax. No acute osseous abnormality. IMPRESSION: 1. Interval insertion of gastric decompression tube with the tip in the left upper quadrant, likely within the stomach. 2. Interval development of mild pulmonary edema. 3. Unchanged cardiomegaly. Electronically Signed   By: Ruthann Cancer M.D.   On: 04/03/2021 11:31   DG Chest Port 1 View  Result Date: 04/03/2021 CLINICAL DATA:  Acute respiratory failure with hypercarbia. EXAM: PORTABLE CHEST 1 VIEW COMPARISON:  Radiograph 1 hour ago.  Chest CT 08/10/2018 FINDINGS: Endotracheal tube is grossly stable in positioning. Stable cardiomegaly. Vascular congestion is improving. Left perihilar opacities improving. No visualized pneumothorax or significant pleural effusion. Prominence of the right paratracheal stripe corresponds to vascular overlap on prior CT. IMPRESSION: 1. Improving vascular congestion and left perihilar opacities. 2. Stable cardiomegaly. Electronically Signed   By: Keith Rake M.D.   On: 04/03/2021 01:58   DG Chest Portable 1 View  Result Date: 04/03/2021 CLINICAL DATA:   Post CPR.  Intubation. EXAM: PORTABLE CHEST 1 VIEW COMPARISON:  Radiograph 01/25/2021 FINDINGS: Endotracheal tube tip is 4.1 cm from the carina. Chronic cardiomegaly. Aortic atherosclerosis. New vascular congestion. Left perihilar opacity. No pneumothorax or large pleural effusion. No acute osseous abnormalities are seen. IMPRESSION: 1. Endotracheal tube tip 4.1 cm from the carina. 2. Unchanged cardiomegaly. Vascular congestion. Left perihilar opacity may be atelectasis, pneumonia, or less likely asymmetric edema. Electronically Signed   By: Keith Rake M.D.   On: 04/03/2021 01:20   EEG adult  Result Date: 04/03/2021 Lora Havens, MD     04/03/2021  9:45 AM Patient Name: MYLIK PRO MRN: [id number] Epilepsy Attending: Lora Havens Referring Physician/Provider: Dr Amie Portland  Date: 04/03/2021 Duration: 22.05 mins Patient history: 65yo M s/p cardiac arrest. EEG to evaluate for seizure Level of alertness:  comatose AEDs during EEG study: LEV Technical aspects: This EEG study was done with scalp electrodes positioned according to the 10-20 International system of electrode placement. Electrical activity was acquired at a sampling rate of _0  and reviewed with a high frequency filter of _1  and a low frequency filter of _2 . EEG data were recorded continuously and digitally stored. Description: EEG showed continuous generalized 3 to 5 Hz theta-delta slowing admixed with 15-_3  frontocentral beta activity. Hyperventilation and photic stimulation were not performed.   ABNORMALITY - Continuous slow, generalized IMPRESSION: This study is suggestive of severe diffuse encephalopathy, nonspecific etiology. No seizures or epileptiform discharges were seen throughout the recording. Priyanka Barbra Sarks   Overnight EEG with video  Result Date: 04/04/2021 Lora Havens, MD     04/04/2021  9:25 AM MRN: [id number] Epilepsy Attending: Lora Havens Referring Physician/Provider: Dr Amie Portland Duration:  04/03/2021 1608 to 04/04/2021 0915  Patient history: 65yo M s/p cardiac arrest. EEG to evaluate for seizure  Level of alertness:  comatose  AEDs during EEG study: LEV, propofol  Technical aspects: This EEG study was done with scalp electrodes positioned according to the 10-20 International system of electrode placement. Electrical activity was acquired at a sampling rate of _4  and reviewed with a high frequency filter of _5  and a low frequency filter of _6 . EEG data were recorded continuously and digitally stored.  Description: EEG showed continuous generalized 3 to 5 Hz theta-delta slowing admixed with 15-_7  frontocentral beta activity. Hyperventilation and photic stimulation were not performed.    ABNORMALITY - Continuous slow, generalized  IMPRESSION: This study is suggestive of severe diffuse encephalopathy, nonspecific etiology. No seizures or epileptiform discharges were seen throughout the recording.  Priyanka Barbra Sarks    Medications:  sodium chloride 10 mL/hr at 04/04/21 1000   sodium chloride     sodium chloride     dextrose Stopped (04/03/21 2000)   piperacillin-tazobactam (ZOSYN)  IV Stopped (04/04/21 9030)   propofol (DIPRIVAN) infusion Stopped (04/04/21 0945)    budesonide (PULMICORT) nebulizer solution  0.25 mg Nebulization BID   chlorhexidine gluconate (MEDLINE KIT)  15 mL Mouth Rinse BID   Chlorhexidine Gluconate Cloth  6 each Topical Q0600   docusate  100 mg Per Tube BID   feeding supplement (PROSource TF)  90 mL Per Tube TID   feeding supplement (VITAL HIGH PROTEIN)  1,000 mL Per Tube S92Z   folic acid  1 mg Per Tube Daily   heparin injection (subcutaneous)  5,000 Units Subcutaneous Q8H   insulin aspart  0-6 Units Subcutaneous Q4H   ipratropium-albuterol  3 mL Nebulization Q6H   mouth rinse  15 mL Mouth Rinse 10 times per day   pantoprazole (PROTONIX) IV  40 mg Intravenous QHS   polyethylene glycol  17 g Per Tube Daily   thiamine  100 mg Per Tube Daily    Dialysis  Orders: TTS - [place] 4hrs, BFR 450, DFR Auto flow 1.5,  EDW 69kg, 2K/ 2Ca Sensipar 149m with HD-last dose 03/25/21 Calcitriol 324m (recently raised) with HD-last dose (2.7513m on 03/25/21 Venofer 100m14m X 10 doses-hasn't been given yet.  Assessment/Plan: Acute Encephalopathy/Myoclonus jerking- Neurology following for possible anoxic brain injury. EEG completed-shows diffuse encephalopathy, no seizure activity S/P Cardiac Arrest-TTE completed awaiting results; managed by critical care service and neurology ESRD -  on HD TTS. HD completed yesterday  evening. Tolerated net UF 2.8L. Plan for HD 9/27 per usual schedule. Hypertension/volume  - Blood pressures variable; doesn't appear volume overloaded. Continue to monitor trends.  Anemia of CKD - Hgb 11.9. ESA/Fe not indicated at this time  Secondary Hyperparathyroidism - Ca okay but PO4 very high-not sure how accurate this is-was 8.3 in outpatient on 03/25/21. I spoke to bedside RN who informed me that patient does not have PO access d/t his lethargy. Cortrack may be placed tomorrow. If this occurs, plan to start Renvela (powder form). Nutrition - Currently NPO  Tobie Poet, NP [facility] 04/04/2021,10:25 AM  LOS: 1 day

## 2021-04-04 NOTE — Progress Notes (Signed)
Hypoglycemic Event  CBG: 62  Treatment: D50 25 mL (12.5 gm)  Symptoms: None  Follow-up CBG: [id number] CBG Result:106  Possible Reasons for Event: Inadequate meal intake  Comments/MD notified:Meier    Truman Hayward, Anabel Bene

## 2021-04-04 NOTE — Consult Note (Signed)
Cardiology Consultation:   Patient ID: Raymond [name] MRN: [id number]; DOB: [birth year]  Admit date: 04/03/2021 Date of Consult: 04/04/2021  PCP:  [name], [facility] Providers Cardiologist:  Jenkins Rouge, MD    Patient Profile:   Raymond [name] is a 65 y.o. male with a hx of COPD, ESRD on iHD, HCV, HTN, substance abuse, thyroid disease, DM2, mild CAD who is being seen 04/04/2021 for the evaluation of troponin elevation at the request of Dr Verlee Monte.  History of Present Illness:   Raymond [name] presented 04/03/2021 after being found down by EMS. He suffered a PEA arrest in the field and was successfully resuscitated. He was hypoglycemic and significantly hypothermic in the field. He was intubated in the ER.  He has improved since arriving with intact neuro status.  Today he is awake but not participating in the history. He will say yes/no.    Past Medical History:  Diagnosis Date   Anemia    CAD (coronary artery disease)    a. 03/2018: cath showing 43 to 30% stenosis along the LAD, luminal irregularities along the RCA, and angiographically normal LCx   COPD (chronic obstructive pulmonary disease) (HCC)    ESRD (end stage renal disease) on dialysis [facility])    "MWF; Jeneen Rinks" (07/22/17)   Hepatitis C    Still positive s/p liver biopsy at [facility]  and interferon therapy for 6 months. Most recent lab work was on 10/24/12   Hepatitis C    "took the tx; gone now" (12/05/2016)  Was treated   History of blood transfusion ~ 2012/2013   "related to my kidneys; blood was low"   Hypertension    Peripheral arterial disease ([place])    Substance abuse ([place])    Thyroid disease    Type 2 diabetes mellitus with other diabetic kidney complication ([place]) 9/92/4268    Past Surgical History:  Procedure Laterality Date   ABDOMINAL AORTOGRAM W/LOWER EXTREMITY N/A 02/08/2018   Procedure: ABDOMINAL AORTOGRAM W/LOWER EXTREMITY;  Surgeon: Conrad Butler, MD;  Location: [place] CV LAB;   Service: Cardiovascular;  Laterality: N/A;   ABDOMINAL AORTOGRAM W/LOWER EXTREMITY Bilateral 06/15/2020   Procedure: ABDOMINAL AORTOGRAM W/LOWER EXTREMITY;  Surgeon: Waynetta Sandy, MD;  Location: [place] CV LAB;  Service: Cardiovascular;  Laterality: Bilateral;   AV FISTULA PLACEMENT Left Aug. 2013 ?   BIOPSY  01/17/2020   Procedure: BIOPSY;  Surgeon: Jackquline Denmark, MD;  Location: [place];  Service: Endoscopy;;   COLONOSCOPY WITH PROPOFOL N/A 08/21/2018   Procedure: COLONOSCOPY WITH PROPOFOL;  Surgeon: Yetta Flock, MD;  Location: [place];  Service: Gastroenterology;  Laterality: N/A;   ENTEROSCOPY N/A 01/17/2020   Procedure: ENTEROSCOPY;  Surgeon: Jackquline Denmark, MD;  Location: [facility] ENDOSCOPY;  Service: Endoscopy;  Laterality: N/A;   ENTEROSCOPY N/A 07/31/2020   Procedure: ENTEROSCOPY;  Surgeon: Doran Stabler, MD;  Location: [place];  Service: Gastroenterology;  Laterality: N/A;   ENTEROSCOPY N/A 01/27/2021   Procedure: ENTEROSCOPY;  Surgeon: Yetta Flock, MD;  Location: [facility] ENDOSCOPY;  Service: Gastroenterology;  Laterality: N/A;   ESOPHAGOGASTRODUODENOSCOPY (EGD) WITH PROPOFOL N/A 08/20/2018   Procedure: ESOPHAGOGASTRODUODENOSCOPY (EGD) WITH PROPOFOL;  Surgeon: Gatha Mayer, MD;  Location: [place];  Service: Endoscopy;  Laterality: N/A;   FEMORAL-POPLITEAL BYPASS GRAFT Left 04/11/2018   Procedure: BYPASS GRAFT FEMORAL-POPLITEAL ARTERY LEFT LEG;  Surgeon: Rosetta Posner, MD;  Location: [place];  Service: Vascular;  Laterality: Left;   GIVENS CAPSULE STUDY  N/A 07/31/2020   Procedure: GIVENS CAPSULE STUDY;  Surgeon: Doran Stabler, MD;  Location: [place];  Service: Gastroenterology;  Laterality: N/A;   HOT HEMOSTASIS N/A 08/20/2018   Procedure: HOT HEMOSTASIS (ARGON PLASMA COAGULATION/BICAP);  Surgeon: Gatha Mayer, MD;  Location: [facility] ENDOSCOPY;  Service: Endoscopy;  Laterality: N/A;   HOT HEMOSTASIS N/A 01/17/2020   Procedure: HOT HEMOSTASIS  (ARGON PLASMA COAGULATION/BICAP);  Surgeon: Jackquline Denmark, MD;  Location: [facility] ENDOSCOPY;  Service: Endoscopy;  Laterality: N/A;   HOT HEMOSTASIS N/A 01/27/2021   Procedure: HOT HEMOSTASIS (ARGON PLASMA COAGULATION/BICAP);  Surgeon: Yetta Flock, MD;  Location: [facility] ENDOSCOPY;  Service: Gastroenterology;  Laterality: N/A;   LEFT HEART CATH AND CORONARY ANGIOGRAPHY N/A 03/29/2018   Procedure: LEFT HEART CATH AND CORONARY ANGIOGRAPHY;  Surgeon: Nelva Bush, MD;  Location: [place] CV LAB;  Service: Cardiovascular;  Laterality: N/A;   LIVER BIOPSY     ORIF ULNAR FRACTURE Left 05/18/2017   Procedure: OPEN REDUCTION INTERNAL FIXATION (ORIF) ULNAR FRACTURE;  Surgeon: Iran Planas, MD;  Location: [place];  Service: Orthopedics;  Laterality: Left;   PERIPHERAL VASCULAR INTERVENTION Right 06/15/2020   Procedure: PERIPHERAL VASCULAR INTERVENTION;  Surgeon: Waynetta Sandy, MD;  Location: [place] CV LAB;  Service: Cardiovascular;  Laterality: Right;   POLYPECTOMY  08/21/2018   Procedure: POLYPECTOMY;  Surgeon: Yetta Flock, MD;  Location: [place];  Service: Gastroenterology;;   Earney Mallet N/A 12/11/2012   Procedure: Earney Mallet;  Surgeon: Serafina Mitchell, MD;  Location: [facility] CATH LAB;  Service: Cardiovascular;  Laterality: N/A;   WOUND EXPLORATION Left 06/15/2020   Procedure: GROIN  EXPLORATION;  Surgeon: Waynetta Sandy, MD;  Location: [facility] OR;  Service: Vascular;  Laterality: Left;       Inpatient Medications: Scheduled Meds:  budesonide (PULMICORT) nebulizer solution  0.25 mg Nebulization BID   chlorhexidine gluconate (MEDLINE KIT)  15 mL Mouth Rinse BID   Chlorhexidine Gluconate Cloth  6 each Topical Q0600   [START ON 9/81/1914] folic acid  1 mg Oral Daily   heparin injection (subcutaneous)  5,000 Units Subcutaneous Q8H   insulin aspart  0-6 Units Subcutaneous Q4H   ipratropium-albuterol  3 mL Nebulization Q6H   mouth rinse  15 mL Mouth Rinse 10 times per day    pantoprazole (PROTONIX) IV  40 mg Intravenous QHS   [START ON 04/05/2021] thiamine  100 mg Oral Daily   Continuous Infusions:  sodium chloride Stopped (04/04/21 1158)   sodium chloride     sodium chloride     dextrose 30 mL/hr at 04/04/21 1201   piperacillin-tazobactam (ZOSYN)  IV Stopped (04/04/21 7829)   PRN Meds: sodium chloride, sodium chloride, sodium chloride, alteplase, calcium chloride, docusate sodium, fentaNYL (SUBLIMAZE) injection, fentaNYL (SUBLIMAZE) injection, heparin, hydrALAZINE, lidocaine (PF), lidocaine-prilocaine, pentafluoroprop-tetrafluoroeth, polyethylene glycol  Allergies:   No Known Allergies  Social History:   Social History   Socioeconomic History   Marital status: Single    Spouse name: Not on file   Number of children: 2   Years of education: Not on file   Highest education level: 11th grade  Occupational History   Not on file  Tobacco Use   Smoking status: Every Day    Packs/day: 0.20    Years: 25.00    Pack years: 5.00    Types: Cigarettes    Last attempt to quit: 08/01/2011    Years since quitting: 9.6   Smokeless tobacco: Never   Tobacco comments:    he smokes 6  cigarettes a week  Vaping Use   Vaping Use: Never used  Substance and Sexual Activity   Alcohol use: Yes    Alcohol/week: 10.0 standard drinks    Types: 10 Glasses of wine per week   Drug use: Not Currently    Types: Cocaine   Sexual activity: Not on file  Other Topics Concern   Not on file  Social History Narrative   Patient is living in a apartment alone. Professional cement finisher. Has 2 children (40yo & 39yo sons). Plans to make HCPOA for sister Neoma Laming to make decisions for him should he need them.    Social Determinants of Health   Financial Resource Strain: Not on file  Food Insecurity: Not on file  Transportation Needs: Not on file  Physical Activity: Not on file  Stress: Not on file  Social Connections: Not on file  Intimate Partner Violence: Not on file     Family History:    Family History  Problem Relation Age of Onset   Diabetes Father    Hypertension Father    Heart disease Father      ROS:  Please see the history of present illness.   All other ROS reviewed and negative.     Physical Exam/Data:   Vitals:   04/04/21 1132 04/04/21 1145 04/04/21 1200 04/04/21 1215  BP: 132/78 113/86 (!) 142/72 (!) 165/80  Pulse: 83 82 84 87  Resp: '18 16 17 14  ' Temp:      TempSrc:      SpO2: 98% 100% 100% 98%  Weight:      Height:        Intake/Output Summary (Last 24 hours) at 04/04/2021 1317 Last data filed at 04/04/2021 1200 Gross per 24 hour  Intake 2060.44 ml  Output 2854 ml  Net -793.56 ml   Last 3 Weights 04/04/2021 04/03/2021 03/03/2021  Weight (lbs) 173 lb 4.5 oz 184 lb 11.9 oz 155 lb 8 oz  Weight (kg) 78.6 kg 83.8 kg 70.534 kg     Body mass index is 27.14 kg/m.    General:  Chronically ill appearing in NAD in bed at 15 degrees. HEENT: normal. Poor dentition Neck: no JVD Vascular: No carotid bruits; Distal pulses intact Cardiac:  normal S1, S2; regular rhythm Lungs:  clear to auscultation bilaterally, coarse upper airway sounds Abd: soft, nontender, no hepatomegaly  Ext: no edema Musculoskeletal:  No deformities, BUE and BLE strength normal and equal Skin: warm and dry  Neuro:  nonfocal Psych:  Normal affect   EKG:  The EKG was personally reviewed and demonstrates:  LVH with secondary repolarization abnormalities. QTc prolongation.   ECG on arrival shows junctional rhythm with LVH  ECGs sinc earrival show QT prolongation with QTc approaching 677m  Telemetry:  Telemetry was personally reviewed and demonstrates:  sinus  Relevant CV Studies:  04/04/2021 Echo personally reviewed LVEF normal, 55% Severe LVH RV normal Dilated atria Moderate TR Dilated aorta    Laboratory Data:  High Sensitivity Troponin:   Recent Labs  Lab 04/03/21 0025 04/03/21 0530 04/03/21 2152 04/04/21 0113  TROPONINIHS 124* 130*  242* 291*     Chemistry Recent Labs  Lab 04/03/21 0530 04/03/21 1059 04/03/21 1629 04/03/21 2100 04/04/21 0113 04/04/21 0500 04/04/21 0638  NA 135 140   < > 141 137 135 136  K 5.6* 5.1   < > 5.1 3.0* 3.3* 4.2  CL 96* 100   < > 102 99 91*  --   CO2 13* 16*   < >  19* 24 27  --   GLUCOSE 721* 329*   < > 173* 126* 387*  --   BUN 94* 104*   < > 115* 39* 51*  --   CREATININE 18.17* 19.63*   < > 20.00* 8.18* 10.60*  --   CALCIUM 9.1 9.0   < > 8.9 9.0 7.4*  --   MG 2.3 2.5*  --   --   --  1.9  --   GFRNONAA 3* 2*   < > 2* 7* 5*  --   ANIONGAP 26* 24*   < > 20* 14 17*  --    < > = values in this interval not displayed.    Recent Labs  Lab 04/03/21 0025 04/04/21 0500  PROT 7.1 5.4*  ALBUMIN 3.1* 2.2*  AST 29 19  ALT 18 18  ALKPHOS 55 42  BILITOT 0.7 0.9   Lipids  Recent Labs  Lab 04/04/21 0500  TRIG 649*    Hematology Recent Labs  Lab 04/03/21 0025 04/03/21 0046 04/03/21 0530 04/04/21 0500 04/04/21 0638  WBC 1.5*  --  0.7* 3.2*  --   RBC 3.40*  --  3.42* 3.28*  --   HGB 11.6*   < > 11.4* 11.1* 11.9*  HCT 39.0   < > 37.0* 33.3* 35.0*  MCV 114.7*  --  108.2* 101.5*  --   MCH 34.1*  --  33.3 33.8  --   MCHC 29.7*  --  30.8 33.3  --   RDW 18.6*  --  18.3* 17.9*  --   PLT 62*  --  57* 75*  --    < > = values in this interval not displayed.   Thyroid  Recent Labs  Lab 04/03/21 1045  TSH 4.085  FREET4 0.89    BNPNo results for input(s): BNP, PROBNP in the last 168 hours.  DDimer No results for input(s): DDIMER in the last 168 hours.   Radiology/Studies:  CT HEAD WO CONTRAST (5MM)  Result Date: 04/03/2021 CLINICAL DATA:  Altered mental status. EXAM: CT HEAD WITHOUT CONTRAST TECHNIQUE: Contiguous axial images were obtained from the base of the skull through the vertex without intravenous contrast. COMPARISON:  Head CT and brain MRI 01/19/2021 FINDINGS: Brain: Stable degree of atrophy and chronic small vessel ischemia. No intracranial hemorrhage, mass effect, or  midline shift. No hydrocephalus. The basilar cisterns are patent. No evidence of territorial infarct or acute ischemia. No extra-axial or intracranial fluid collection. Vascular: Atherosclerosis of skullbase vasculature without hyperdense vessel or abnormal calcification. Skull: No fracture or focal lesion. Sinuses/Orbits: Scattered mucosal thickening and opacification of the paranasal sinuses. No mastoid effusion. Patient is intubated. Other: None. IMPRESSION: 1. No acute intracranial abnormality. 2. Stable atrophy and chronic small vessel ischemia. Electronically Signed   By: Keith Rake M.D.   On: 04/03/2021 01:56   DG CHEST PORT 1 VIEW  Result Date: 04/03/2021 CLINICAL DATA:  65 year old male status post orogastric tube placement. EXAM: PORTABLE CHEST - 1 VIEW COMPARISON:  Earlier the same day. FINDINGS: Interval insertion of gastric decompression tube with the tip in the left upper quadrant. Unchanged position of the indwelling endotracheal tube with the tip in the midthoracic trachea. Defibrillator pads remain in place. The mediastinal contours are within normal limits. Unchanged cardiomegaly. Interval increased scattered bilateral hazy pulmonary opacities. No significant pleural effusion or evidence of pneumothorax. No acute osseous abnormality. IMPRESSION: 1. Interval insertion of gastric decompression tube with the tip in the left upper quadrant, likely  within the stomach. 2. Interval development of mild pulmonary edema. 3. Unchanged cardiomegaly. Electronically Signed   By: Ruthann Cancer M.D.   On: 04/03/2021 11:31   DG Chest Port 1 View  Result Date: 04/03/2021 CLINICAL DATA:  Acute respiratory failure with hypercarbia. EXAM: PORTABLE CHEST 1 VIEW COMPARISON:  Radiograph 1 hour ago.  Chest CT 08/10/2018 FINDINGS: Endotracheal tube is grossly stable in positioning. Stable cardiomegaly. Vascular congestion is improving. Left perihilar opacities improving. No visualized pneumothorax or  significant pleural effusion. Prominence of the right paratracheal stripe corresponds to vascular overlap on prior CT. IMPRESSION: 1. Improving vascular congestion and left perihilar opacities. 2. Stable cardiomegaly. Electronically Signed   By: Keith Rake M.D.   On: 04/03/2021 01:58   DG Chest Portable 1 View  Result Date: 04/03/2021 CLINICAL DATA:  Post CPR.  Intubation. EXAM: PORTABLE CHEST 1 VIEW COMPARISON:  Radiograph 01/25/2021 FINDINGS: Endotracheal tube tip is 4.1 cm from the carina. Chronic cardiomegaly. Aortic atherosclerosis. New vascular congestion. Left perihilar opacity. No pneumothorax or large pleural effusion. No acute osseous abnormalities are seen. IMPRESSION: 1. Endotracheal tube tip 4.1 cm from the carina. 2. Unchanged cardiomegaly. Vascular congestion. Left perihilar opacity may be atelectasis, pneumonia, or less likely asymmetric edema. Electronically Signed   By: Keith Rake M.D.   On: 04/03/2021 01:20   EEG adult  Result Date: 04/03/2021 Lora Havens, MD     04/03/2021  9:45 AM Patient Name: Raymond [name] MRN: [id number] Epilepsy Attending: Lora Havens Referring Physician/Provider: Dr Amie [place] Date: 04/03/2021 Duration: 22.05 mins Patient history: 65yo M s/p cardiac arrest. EEG to evaluate for seizure Level of alertness:  comatose AEDs during EEG study: LEV Technical aspects: This EEG study was done with scalp electrodes positioned according to the 10-20 International system of electrode placement. Electrical activity was acquired at a sampling rate of '500Hz'  and reviewed with a high frequency filter of '70Hz'  and a low frequency filter of '1Hz' . EEG data were recorded continuously and digitally stored. Description: EEG showed continuous generalized 3 to 5 Hz theta-delta slowing admixed with 15-'18Hz'  frontocentral beta activity. Hyperventilation and photic stimulation were not performed.   ABNORMALITY - Continuous slow, generalized IMPRESSION: This study is  suggestive of severe diffuse encephalopathy, nonspecific etiology. No seizures or epileptiform discharges were seen throughout the recording. Priyanka Barbra Sarks   Overnight EEG with video  Result Date: 04/04/2021 Lora Havens, MD     04/04/2021  9:25 AM MRN: [id number] Epilepsy Attending: Lora Havens Referring Physician/Provider: Dr Amie [place] Duration: 04/03/2021 1608 to 04/04/2021 0915  Patient history: 66yo M s/p cardiac arrest. EEG to evaluate for seizure  Level of alertness:  comatose  AEDs during EEG study: LEV, propofol  Technical aspects: This EEG study was done with scalp electrodes positioned according to the 10-20 International system of electrode placement. Electrical activity was acquired at a sampling rate of '500Hz'  and reviewed with a high frequency filter of '70Hz'  and a low frequency filter of '1Hz' . EEG data were recorded continuously and digitally stored.  Description: EEG showed continuous generalized 3 to 5 Hz theta-delta slowing admixed with 15-'18Hz'  frontocentral beta activity. Hyperventilation and photic stimulation were not performed.    ABNORMALITY - Continuous slow, generalized  IMPRESSION: This study is suggestive of severe diffuse encephalopathy, nonspecific etiology. No seizures or epileptiform discharges were seen throughout the recording.  Lora Havens   ECHOCARDIOGRAM COMPLETE  Result Date: 04/04/2021    ECHOCARDIOGRAM REPORT   Patient Name:   Raymond [name] Date of Exam: 04/04/2021 Medical Rec #:  [id number]     Height:       67.0 in Accession #:    [id number]    Weight:       173.3 lb Date of Birth:  05/10/1956     BSA:          1.903 m Patient Age:    51 years      BP:           108/47 mmHg Patient Gender: M             HR:           86 bpm. Exam Location:  Inpatient Procedure: 2D Echo Indications:    R94.31 Abnormal EKG. Cardiac arrest.  History:        Patient has prior history of Echocardiogram examinations, most                 recent 01/27/2021. Abnormal ECG, COPD,  Arrythmias:Atrial Flutter,                 Signs/Symptoms:Shortness of Breath, Dyspnea and Altered Mental                 Status; Risk Factors:Diabetes and Hypertension. ESRD. Cardiac                 arrest.  Sonographer:    Roseanna Rainbow RDCS Referring Phys: [id number] Chrystie Nose PAYNE  Sonographer Comments: Echo performed with patient supine and on artificial respirator. IMPRESSIONS  1. Left ventricular ejection fraction, by estimation, is 50 to 55%. The left ventricle has low normal function. The left ventricle demonstrates global hypokinesis. The left ventricular internal cavity size was mildly dilated. There is severe left ventricular hypertrophy. Left ventricular diastolic parameters are indeterminate. The average left ventricular global longitudinal strain is -19.1 %. The global longitudinal strain is normal.  2. Right ventricular systolic function is normal. The right ventricular size is normal. There is moderately elevated pulmonary artery systolic pressure.  3. Left atrial size was moderately dilated.  4. Right atrial size was moderately dilated.  5. The mitral valve is abnormal. Trivial mitral valve regurgitation. No evidence of mitral stenosis.  6. Tricuspid valve regurgitation is moderate.  7. The aortic valve is tricuspid. There is moderate calcification of the aortic valve. Aortic valve regurgitation is not visualized. Mild to moderate aortic valve sclerosis/calcification is present, without any evidence of aortic stenosis.  8. Aortic dilatation noted. There is moderate dilatation of the ascending aorta, measuring 45 mm.  9. The inferior vena cava is normal in size with greater than 50% respiratory variability, suggesting right atrial pressure of 3 mmHg. FINDINGS  Left Ventricle: Left ventricular ejection fraction, by estimation, is 50 to 55%. The left ventricle has low normal function. The left ventricle demonstrates global hypokinesis. The average left ventricular global longitudinal strain is -19.1 %. The  global longitudinal strain is normal. The left ventricular internal cavity size was mildly dilated. There is severe left ventricular hypertrophy. Left ventricular diastolic parameters are indeterminate. Right Ventricle: The right ventricular size is normal. No increase in right ventricular wall thickness. Right ventricular systolic function is normal. There is moderately elevated pulmonary artery systolic pressure. The tricuspid regurgitant velocity is 3.19 m/s, and with an assumed right atrial pressure of 15 mmHg, the estimated right ventricular systolic pressure is 11.5 mmHg. Left Atrium: Left atrial size was moderately dilated. Right Atrium: Right atrial size was moderately dilated. Pericardium: There is no  evidence of pericardial effusion. Mitral Valve: The mitral valve is abnormal. There is mild thickening of the mitral valve leaflet(s). There is mild calcification of the mitral valve leaflet(s). Mild mitral annular calcification. Trivial mitral valve regurgitation. No evidence of mitral valve stenosis. Tricuspid Valve: The tricuspid valve is normal in structure. Tricuspid valve regurgitation is moderate . No evidence of tricuspid stenosis. Aortic Valve: The aortic valve is tricuspid. There is moderate calcification of the aortic valve. Aortic valve regurgitation is not visualized. Mild to moderate aortic valve sclerosis/calcification is present, without any evidence of aortic stenosis. Pulmonic Valve: The pulmonic valve was normal in structure. Pulmonic valve regurgitation is not visualized. No evidence of pulmonic stenosis. Aorta: Aortic dilatation noted. There is moderate dilatation of the ascending aorta, measuring 45 mm. Venous: The inferior vena cava is normal in size with greater than 50% respiratory variability, suggesting right atrial pressure of 3 mmHg. IAS/Shunts: No atrial level shunt detected by color flow Doppler.  LEFT VENTRICLE PLAX 2D LVIDd:         4.40 cm     Diastology LVIDs:         3.40 cm      LV e' medial:    7.72 cm/s LV PW:         2.30 cm     LV E/e' medial:  9.9 LV IVS:        1.80 cm     LV e' lateral:   8.81 cm/s LVOT diam:     2.20 cm     LV E/e' lateral: 8.6 LV SV:         70 LV SV Index:   37          2D Longitudinal Strain LVOT Area:     3.80 cm    2D Strain GLS Avg:     -19.1 %  LV Volumes (MOD) LV vol d, MOD A2C: 96.7 ml LV vol d, MOD A4C: 64.1 ml LV vol s, MOD A2C: 44.3 ml LV vol s, MOD A4C: 33.9 ml LV SV MOD A2C:     52.4 ml LV SV MOD A4C:     64.1 ml LV SV MOD BP:      43.8 ml RIGHT VENTRICLE         IVC TAPSE (M-mode): 1.5 cm  IVC diam: 2.40 cm LEFT ATRIUM             Index       RIGHT ATRIUM           Index LA diam:        4.30 cm 2.26 cm/m  RA Area:     23.80 cm LA Vol (A2C):   39.5 ml 20.76 ml/m RA Volume:   72.40 ml  38.05 ml/m LA Vol (A4C):   71.9 ml 37.79 ml/m LA Biplane Vol: 55.8 ml 29.33 ml/m  AORTIC VALVE             PULMONIC VALVE LVOT Vmax:   117.00 cm/s PR End Diast Vel: 2.11 msec LVOT Vmean:  68.400 cm/s LVOT VTI:    0.183 m  AORTA Ao Root diam: 3.90 cm Ao Asc diam:  4.50 cm MITRAL VALVE               TRICUSPID VALVE MV Area (PHT): 3.68 cm    TR Peak grad:   40.7 mmHg MV Decel Time: 206 msec    TR Vmax:        319.00 cm/s MV E  velocity: 76.05 cm/s MV A velocity: 86.50 cm/s  SHUNTS MV E/A ratio:  0.88        Systemic VTI:  0.18 m                            Systemic Diam: 2.20 cm Jenkins Rouge MD Electronically signed by Jenkins Rouge MD Signature Date/Time: 04/04/2021/10:49:42 AM    Final      Assessment and Plan:   Troponin Elevation I suspect his troponin elevation is secondary to his cardiac arrest in the setting of ESRD. I do not suspect ACS. His ECG does not have any acute ischemic findings. His echo shows no evidence of ischemic heart disease.  2. LVH Continued blood pressure control. Outpatient follow up.  3. Dilated aorta Outpatient follow up for ongoing surveillance.   4. QT prolongation Historically his QT are prolonged. He presented in  junctional rhythm in the setting of severe hypothermia. Recommend daily ECG while inpatient to monitor for resolution.  5. Atrial flutter hx On outpatient amiodarone? Recommend restarting coumadin at discharge with follow up in coumadin clinic.  He will need close outpatient follow up to monitor for any off-target effects from his amiodarone.    For questions or updates, please contact [place] Please consult [email or web address] for contact info under    Signed, Vickie Epley, MD  04/04/2021 1:17 PM

## 2021-04-04 NOTE — Progress Notes (Signed)
EEG LTM stopped and no skin breakdown noted. Results pending.

## 2021-04-04 NOTE — Progress Notes (Signed)
Hypoglycemic Event  CBG: 66  Treatment: D50 25 mL (12.5 gm)  Symptoms: None  Follow-up CBG: [id number]:[id number] CBG Result:102  Possible Reasons for Event: Inadequate meal intake  Comments/MD notified:D10W increased from 30cc/hr to 50cc/hr per MD order    Dillard Essex

## 2021-04-04 NOTE — Progress Notes (Signed)
NAME:  Raymond [name] MRN:  [id number] DOB:  1956/01/18 LOS: 1 ADMISSION DATE:  04/03/2021 DATE OF SERVICE:  04/03/2021  CHIEF COMPLAINT:  cardiac arrest in the field   HISTORY & PHYSICAL  History of Present Illness  This 64 y.o. African American male smoker presented to the [facility] Emergency Department via EMS with complaints of altered mental status.  A well-check request was called in by a family member, and EMS apparently found the patient to be unresponsive and hypoglycemic (22).  He subsequently experienced cardiac arrest in the field.  CPR was initiated with ROSC achieved after 20 min of CPR/ACLS efforts per verbal report (no further details at this time).  The patient was also noted to be hypothermic (rectal probe unable to register temp).  In the ER, the patient had [facility] airway exchanged to endotracheal tube (with RSI medications).  He was started on [facility].  Monitor showed junctional bradycardia.  PCCM asked to admit.  REVIEW OF SYSTEMS This patient is critically ill and cannot provide additional history nor review of systems due to mental status/unconsciousness and endotracheally intubated.   Past Medical/Surgical/Social/Family History   Past Medical History:  Diagnosis Date   Anemia    CAD (coronary artery disease)    a. 03/2018: cath showing 25 to 30% stenosis along the LAD, luminal irregularities along the RCA, and angiographically normal LCx   COPD (chronic obstructive pulmonary disease) (HCC)    ESRD (end stage renal disease) on dialysis ([place])    "MWF; [facility] Rinks" (07/22/17)   Hepatitis C    Still positive s/p liver biopsy at [facility]  and interferon therapy for 6 months. Most recent lab work was on 10/24/12   Hepatitis C    "took the tx; gone now" (12/05/2016)  Was treated   History of blood transfusion ~ 2012/2013   "related to my kidneys; blood was low"   Hypertension    Peripheral arterial disease ([place])    Substance abuse ([place])    Thyroid  disease    Type 2 diabetes mellitus with other diabetic kidney complication ([place]) 07/12/5850    Past Surgical History:  Procedure Laterality Date   ABDOMINAL AORTOGRAM W/LOWER EXTREMITY N/A 02/08/2018   Procedure: ABDOMINAL AORTOGRAM W/LOWER EXTREMITY;  Surgeon: Conrad East Peru, MD;  Location: [name] [facility];  Service: Cardiovascular;  Laterality: N/A;   ABDOMINAL AORTOGRAM W/LOWER EXTREMITY Bilateral 06/15/2020   Procedure: ABDOMINAL AORTOGRAM W/LOWER EXTREMITY;  Surgeon: Waynetta Sandy, MD;  Location: [place] [facility];  Service: Cardiovascular;  Laterality: Bilateral;   AV FISTULA PLACEMENT Left Aug. 2013 ?   BIOPSY  01/17/2020   Procedure: BIOPSY;  Surgeon: Jackquline Denmark, MD;  Location: [place];  Service: Endoscopy;;   COLONOSCOPY WITH PROPOFOL N/A 08/21/2018   Procedure: COLONOSCOPY WITH PROPOFOL;  Surgeon: Yetta Flock, MD;  Location: [place];  Service: Gastroenterology;  Laterality: N/A;   ENTEROSCOPY N/A 01/17/2020   Procedure: ENTEROSCOPY;  Surgeon: Jackquline Denmark, MD;  Location: [facility] ENDOSCOPY;  Service: Endoscopy;  Laterality: N/A;   ENTEROSCOPY N/A 07/31/2020   Procedure: ENTEROSCOPY;  Surgeon: Doran Stabler, MD;  Location: [place];  Service: Gastroenterology;  Laterality: N/A;   ENTEROSCOPY N/A 01/27/2021   Procedure: ENTEROSCOPY;  Surgeon: Yetta Flock, MD;  Location: [facility] ENDOSCOPY;  Service: Gastroenterology;  Laterality: N/A;   ESOPHAGOGASTRODUODENOSCOPY (EGD) WITH PROPOFOL N/A 08/20/2018   Procedure: ESOPHAGOGASTRODUODENOSCOPY (EGD) WITH PROPOFOL;  Surgeon: Gatha Mayer, MD;  Location: [place];  Service: Endoscopy;  Laterality: N/A;   FEMORAL-POPLITEAL BYPASS GRAFT Left 04/11/2018   Procedure: BYPASS GRAFT FEMORAL-POPLITEAL ARTERY LEFT LEG;  Surgeon: Rosetta Posner, MD;  Location: [facility] OR;  Service: Vascular;  Laterality: Left;   GIVENS CAPSULE STUDY N/A 07/31/2020   Procedure: GIVENS CAPSULE STUDY;  Surgeon: Doran Stabler, MD;   Location: [place];  Service: Gastroenterology;  Laterality: N/A;   HOT HEMOSTASIS N/A 08/20/2018   Procedure: HOT HEMOSTASIS (ARGON PLASMA COAGULATION/BICAP);  Surgeon: Gatha Mayer, MD;  Location: [facility] ENDOSCOPY;  Service: Endoscopy;  Laterality: N/A;   HOT HEMOSTASIS N/A 01/17/2020   Procedure: HOT HEMOSTASIS (ARGON PLASMA COAGULATION/BICAP);  Surgeon: Jackquline Denmark, MD;  Location: [facility] ENDOSCOPY;  Service: Endoscopy;  Laterality: N/A;   HOT HEMOSTASIS N/A 01/27/2021   Procedure: HOT HEMOSTASIS (ARGON PLASMA COAGULATION/BICAP);  Surgeon: Yetta Flock, MD;  Location: [facility] ENDOSCOPY;  Service: Gastroenterology;  Laterality: N/A;   LEFT HEART CATH AND CORONARY ANGIOGRAPHY N/A 03/29/2018   Procedure: LEFT HEART CATH AND CORONARY ANGIOGRAPHY;  Surgeon: Nelva Bush, MD;  Location: [place] [facility];  Service: Cardiovascular;  Laterality: N/A;   LIVER BIOPSY     ORIF ULNAR FRACTURE Left 05/18/2017   Procedure: OPEN REDUCTION INTERNAL FIXATION (ORIF) ULNAR FRACTURE;  Surgeon: Iran Planas, MD;  Location: [place];  Service: Orthopedics;  Laterality: Left;   PERIPHERAL VASCULAR INTERVENTION Right 06/15/2020   Procedure: PERIPHERAL VASCULAR INTERVENTION;  Surgeon: Waynetta Sandy, MD;  Location: [place] [facility];  Service: Cardiovascular;  Laterality: Right;   POLYPECTOMY  08/21/2018   Procedure: POLYPECTOMY;  Surgeon: Yetta Flock, MD;  Location: [place];  Service: Gastroenterology;;   Earney Mallet N/A 12/11/2012   Procedure: Earney Mallet;  Surgeon: Serafina Mitchell, MD;  Location: [facility] CATH LAB;  Service: Cardiovascular;  Laterality: N/A;   WOUND EXPLORATION Left 06/15/2020   Procedure: GROIN  EXPLORATION;  Surgeon: Waynetta Sandy, MD;  Location: [facility] OR;  Service: Vascular;  Laterality: Left;    Social History   Tobacco Use   Smoking status: Every Day    Packs/day: 0.20    Years: 25.00    Pack years: 5.00    Types: Cigarettes    Last attempt to quit: 08/01/2011     Years since quitting: 9.6   Smokeless tobacco: Never   Tobacco comments:    he smokes 6 cigarettes a week  Substance Use Topics   Alcohol use: Yes    Alcohol/week: 10.0 standard drinks    Types: 10 Glasses of wine per week    Family History  Problem Relation Age of Onset   Diabetes Father    Hypertension Father    Heart disease Father      Procedures:     Significant Diagnostic Tests:     Micro Data:   Results for orders placed or performed during the hospital encounter of 04/03/21  Resp Panel by RT-PCR (Flu A&B, Covid) Nasopharyngeal Swab     Status: None   Collection Time: 04/03/21 12:26 AM   Specimen: Nasopharyngeal Swab; Nasopharyngeal(NP) swabs in vial transport medium  Result Value Ref Range Status   SARS Coronavirus 2 by RT PCR NEGATIVE NEGATIVE Final    Comment: (NOTE) SARS-CoV-2 target nucleic acids are NOT DETECTED.  The SARS-CoV-2 RNA is generally detectable in upper respiratory specimens during the acute phase of infection. The lowest concentration of SARS-CoV-2 viral copies this assay can detect is 138 copies/mL. A negative result does not preclude SARS-Cov-2 infection and should not be used  as the sole basis for treatment or other patient management decisions. A negative result may occur with  improper specimen collection/handling, submission of specimen other than nasopharyngeal swab, presence of viral mutation(s) within the areas targeted by this assay, and inadequate number of viral copies(<138 copies/mL). A negative result must be combined with clinical observations, patient history, and epidemiological information. The expected result is Negative.  Fact Sheet for Patients:  EntrepreneurPulse.com.au  Fact Sheet for Healthcare Providers:  IncredibleEmployment.be  This test is no t yet approved or cleared by the [place] FDA and  has been authorized for detection and/or diagnosis of SARS-CoV-2 by FDA  under an Emergency Use Authorization (EUA). This EUA will remain  in effect (meaning this test can be used) for the duration of the COVID-19 declaration under Section 564(b)(1) of the Act, 21 U.S.C.section 360bbb-3(b)(1), unless the authorization is terminated  or revoked sooner.       Influenza A by PCR NEGATIVE NEGATIVE Final   Influenza B by PCR NEGATIVE NEGATIVE Final    Comment: (NOTE) The Xpert Xpress SARS-CoV-2/FLU/RSV plus assay is intended as an aid in the diagnosis of influenza from Nasopharyngeal swab specimens and should not be used as a sole basis for treatment. Nasal washings and aspirates are unacceptable for Xpert Xpress SARS-CoV-2/FLU/RSV testing.  Fact Sheet for Patients: EntrepreneurPulse.com.au  Fact Sheet for Healthcare Providers: IncredibleEmployment.be  This test is not yet approved or cleared by the [place] FDA and has been authorized for detection and/or diagnosis of SARS-CoV-2 by FDA under an Emergency Use Authorization (EUA). This EUA will remain in effect (meaning this test can be used) for the duration of the COVID-19 declaration under Section 564(b)(1) of the Act, 21 U.S.C. section 360bbb-3(b)(1), unless the authorization is terminated or revoked.  Performed at [facility], [place] [address] [place], [place] [address]   MRSA Next Gen by PCR, Nasal     Status: None   Collection Time: 04/03/21  2:41 AM   Specimen: Nasal Mucosa; Nasal Swab  Result Value Ref Range Status   MRSA by PCR Next Gen NOT DETECTED NOT DETECTED Final    Comment: (NOTE) The GeneXpert MRSA Assay (FDA approved for NASAL specimens only), is one component of a comprehensive MRSA colonization surveillance program. It is not intended to diagnose MRSA infection nor to guide or monitor treatment for MRSA infections. Test performance is not FDA approved in patients less than 42 years old. Performed at [facility], [place]  [address] [place], [place] [address]       Antimicrobials:  9/24- zosyn-     Interim history/subjective:   Alert and following commands this morning off of sedation. Starting SBT. Got HD last night. New EKG changes in precordial leads.    Objective   BP 111/88   Pulse 86   Temp (!) 97.3 F (36.3 C)   Resp 16   Ht 5\' 7"  (1.702 m)   Wt 78.6 kg   SpO2 100%   BMI 27.14 kg/m     Filed Weights   04/03/21 0255 04/04/21 0400  Weight: 83.8 kg 78.6 kg    Intake/Output Summary (Last 24 hours) at 04/04/2021 0941 Last data filed at 04/04/2021 0900 Gross per 24 hour  Intake 2478.69 ml  Output 2854 ml  Net -375.31 ml    Vent Mode: PRVC FiO2 (%):  [40 %] 40 % Set Rate:  [22 bmp-28 bmp] 22 bmp Vt Set:  [520 mL] 520 mL PEEP:  [5 cmH20]  [place] Pressure:  [16 cmH20-20 cmH20] 17 cmH20   Examination: General appearance: 65 y.o., male, intubated but alert Eyes: anisocoria unchanged, pupils reactive  HENT: NCAT; dry MM Neck: no LAD, JVD Lungs: coarse mechanical breath sounds, equal chest rise CV: RRR no murmur Abdomen: Soft, non-tender; non-distended, BS active Extremities: trace peripheral edema,warm Neuro: Follows commands all extremities.   AG 17 Bicarb 27 pancytopenic   Resolved Hospital Problem list      Assessment & Plan:   Acute encephalopathy: Myoclonus: So far seems to be more attributable to TME than to significant ABI. - neuro following - follow electrolytes - tighten up glycemic control - SAT/SBT and hopefully extubate as below  Post-PEA arrest: Following commands. ?hypoglycemia, hypothermia as cause. - TTE - neuro consulted as above  Acute on chronic hypercapnic respiratory failure: - SAT/SBT and hopefully extubate  ESRD AGMA HD via RUE AVF. S/p HD overnight - nephro consulted  Possible NSTEMI: ST depressions in precordial leads which are new which may reflect LVH assd repolarization vs NSTEMI.  - discuss with cardiology - trend  trop - f/u TTE - s/p ASA 325 this morning  Hypothermia ?caused by hypoglycemia but I've never seen such profound hypothermia from hypoglycemia alone. TSH, fT4, cortisol ok. - if cultures unrevealing at 48h stop ABX  Mildly reduced EF HTN - holding home antihypertensives for now  Pancytopenia: Previously attributed to chronic liver disease. - ctm   Best practice:  Diet: Clinical Dietician consult for tube feeds - holding for SBT Pain/Anxiety/Delirium protocol (if indicated): N/A.  Off sedation for neurologic assessment. VAP protocol (if indicated): YES DVT prophylaxis: sq heparin GI prophylaxis: Protonix Glucose control: N/A Mobility/Activity: bed rest   Code Status: Full Code Family Communication:  patient's family (sister, Neoma Laming, updated by phone) - will reach out later this morning Disposition: ICU   This patient is critically ill with acute toxic metabolic and possibly anoxic encephalopathy, acute respiratory failure with hypoxia on mechanical ventilation; which, requires frequent high complexity decision making, assessment, support, evaluation, and titration of therapies. This was completed through the application of advanced monitoring technologies and extensive interpretation of multiple databases. During this encounter critical care time was devoted to patient care services described in this note for 45 minutes.

## 2021-04-04 NOTE — Procedures (Signed)
Extubation Procedure Note  Patient Details:   Name: Raymond [name] DOB: [birth year] MRN: [id number]   Airway Documentation:  Airway 7.5 mm (Active)  Secured at (cm) 25 cm 04/04/21 0800  Measured From Lips 04/04/21 0900  [place] 04/04/21 0900  Secured By [employer] 04/04/21 0900  Tube Holder Repositioned Yes 04/04/21 0800  Prone position No 04/04/21 0900  Cuff Pressure (cm H2O) Green OR 18-26 CmH2O 04/04/21 0800  Site Condition Dry 04/04/21 0310   Vent end date: 04/04/21 Vent end time: 1045   Evaluation  O2 sats: stable throughout Complications: No apparent complications Patient did tolerate procedure well. Bilateral Breath Sounds: Clear, Diminished   Yes PT extubated to a 4lpm [place], pt had cuff leak  Cordella Register 04/04/2021, 10:58 AM

## 2021-04-04 NOTE — Procedures (Addendum)
MRN: [id number]  Epilepsy Attending: Lora Havens  Referring Physician/Provider: Dr Amie [place] Duration: 04/03/2021 1608 to 04/04/2021 1008   Patient history: 65yo M s/p cardiac arrest. EEG to evaluate for seizure   Level of alertness:  comatose   AEDs during EEG study: LEV, propofol   Technical aspects: This EEG study was done with scalp electrodes positioned according to the 10-20 International system of electrode placement. Electrical activity was acquired at a sampling rate of 500Hz  and reviewed with a high frequency filter of 70Hz  and a low frequency filter of 1Hz . EEG data were recorded continuously and digitally stored.    Description: EEG showed continuous generalized 3 to 5 Hz theta-delta slowing admixed with 15-18Hz  frontocentral beta activity. Hyperventilation and photic stimulation were not performed.      ABNORMALITY - Continuous slow, generalized   IMPRESSION: This study is suggestive of severe diffuse encephalopathy, nonspecific etiology. No seizures or epileptiform discharges were seen throughout the recording.   Pearlina Friedly Barbra Sarks

## 2021-04-04 NOTE — Progress Notes (Signed)
Neurology Progress Note   S:// Patient seen and examined in the ICU.    O:// Current vital signs: BP 116/77 (BP Location: Left Leg)   Pulse 86   Temp 97.9 F (36.6 C) (Bladder)   Resp (!) 25   Ht '5\' 7"'  (1.702 m)   Wt 78.6 kg   SpO2 100%   BMI 27.14 kg/m  Vital signs in last 24 hours: Temp:  [86.9 F (30.5 C)-97.9 F (36.6 C)] 97.9 F (36.6 C) (09/25 0400) Pulse Rate:  [62-87] 86 (09/25 0400) Resp:  [19-28] 25 (09/25 0400) BP: (85-163)/(46-122) 116/77 (09/25 0400) SpO2:  [93 %-100 %] 100 % (09/25 0800) FiO2 (%):  [40 %] 40 % (09/25 0800) Weight:  [78.6 kg] 78.6 kg (09/25 0400) General: Sedated intubated off sedation held for exam HEENT: Normocephalic/atraumatic Lungs: Clear Cardiovascular: Regular rhythm Abdomen nondistended nontender Neurological exam Sedation to help with exam He opened his eyes and track the examiner He was able to inconsistently wiggle his toes to command. He did not closes eyes to command Cranial nerves: Mild anisocoria with left pupil 3 to 4 mm and nonreactive to light, right pupil 2 mm reactive to light.  He is able to track but does not follow commands to let me check his eye movements in all directions-no significant gaze impairment noted.  Blinks to threat inconsistently from both sides.  Cough and gag present. Motor examination: Spontaneously starts to move all extremities.  I question if he did move his toes to command but that was not very consistent. Sensation: Withdrawal to noxious stimulation in all fours   Medications  Current Facility-Administered Medications:    0.9 %  sodium chloride infusion, , Intravenous, PRN, Maryjane Hurter, MD, Stopped at 04/04/21 0441   0.9 %  sodium chloride infusion, 100 mL, Intravenous, PRN, Tobie Poet E, NP   0.9 %  sodium chloride infusion, 100 mL, Intravenous, PRN, Adelfa Koh, NP   alteplase (CATHFLO ACTIVASE) injection 2 mg, 2 mg, Intracatheter, Once PRN, Adelfa Koh,  NP   budesonide (PULMICORT) nebulizer solution 0.25 mg, 0.25 mg, Nebulization, BID, Payne, John D, PA-C, 0.25 mg at 04/04/21 0746   calcium chloride injection, , Intravenous, PRN, Long, Wonda Olds, MD, 1 g at 04/03/21 0031   chlorhexidine gluconate (MEDLINE KIT) (PERIDEX) 0.12 % solution 15 mL, 15 mL, Mouth Rinse, BID, Millen, Jessica B, RPH, 15 mL at 04/04/21 7412   Chlorhexidine Gluconate Cloth 2 % PADS 6 each, 6 each, Topical, Q0600, Adelfa Koh, NP, 6 each at 04/03/21 1200   dextrose 10 % infusion, , Intravenous, Continuous, Anders Simmonds, MD, Stopped at 04/03/21 2000   docusate (COLACE) 50 MG/5ML liquid 100 mg, 100 mg, Per Tube, BID, Maryjane Hurter, MD, 100 mg at 04/03/21 2202   docusate sodium (COLACE) capsule 100 mg, 100 mg, Oral, BID PRN, Andres Labrum D, PA-C   EPINEPHrine (ADRENALIN) 1 MG/10ML injection, , Intravenous, PRN, Long, Wonda Olds, MD, 1 mg at 04/03/21 0030   feeding supplement (PROSource TF) liquid 90 mL, 90 mL, Per Tube, TID, Renee Pain, MD, 90 mL at 04/03/21 2203   feeding supplement (VITAL HIGH PROTEIN) liquid 1,000 mL, 1,000 mL, Per Tube, Q24H, Renee Pain, MD, 1,000 mL at 04/03/21 1230   fentaNYL (SUBLIMAZE) injection 25 mcg, 25 mcg, Intravenous, Q15 min PRN, Maryjane Hurter, MD   fentaNYL (SUBLIMAZE) injection 25-100 mcg, 25-100 mcg, Intravenous, Q30 min PRN, Maryjane Hurter, MD, 100 mcg at 87/86/76 7209   folic  acid (FOLVITE) tablet 1 mg, 1 mg, Per Tube, Daily, Mick Sell, PA-C, 1 mg at 04/03/21 1201   heparin injection 1,000 Units, 1,000 Units, Dialysis, PRN, Adelfa Koh, NP   heparin injection 5,000 Units, 5,000 Units, Subcutaneous, Q8H, Maryjane Hurter, MD, 5,000 Units at 04/04/21 1610   hydrALAZINE (APRESOLINE) injection 10 mg, 10 mg, Intravenous, Q4H PRN, Anders Simmonds, MD, 10 mg at 04/03/21 0414   insulin aspart (novoLOG) injection 0-6 Units, 0-6 Units, Subcutaneous, Q4H, Payne, John D, PA-C, 1 Units at 04/03/21 1949    ipratropium-albuterol (DUONEB) 0.5-2.5 (3) MG/3ML nebulizer solution 3 mL, 3 mL, Nebulization, Q6H, Rollene Rotunda, John D, PA-C, 3 mL at 04/04/21 0745   levETIRAcetam (KEPPRA) 250 mg in sodium chloride 0.9 % 100 mL IVPB, 250 mg, Intravenous, Q12H, Maryjane Hurter, MD, Last Rate: 410 mL/hr at 04/04/21 0810, 250 mg at 04/04/21 0810   lidocaine (PF) (XYLOCAINE) 1 % injection 5 mL, 5 mL, Intradermal, PRN, Adelfa Koh, NP   lidocaine-prilocaine (EMLA) cream 1 application, 1 application, Topical, PRN, Adelfa Koh, NP   MEDLINE mouth rinse, 15 mL, Mouth Rinse, 10 times per day, Priscella Mann, RPH, 15 mL at 04/04/21 9604   pantoprazole (PROTONIX) injection 40 mg, 40 mg, Intravenous, QHS, Payne, John D, PA-C, 40 mg at 04/03/21 2204   pentafluoroprop-tetrafluoroeth (GEBAUERS) aerosol 1 application, 1 application, Topical, PRN, Adelfa Koh, NP   piperacillin-tazobactam (ZOSYN) IVPB 2.25 g, 2.25 g, Intravenous, Q8H, Erenest Blank, RPH, Last Rate: 100 mL/hr at 04/04/21 0613, 2.25 g at 04/04/21 5409   polyethylene glycol (MIRALAX / GLYCOLAX) packet 17 g, 17 g, Oral, Daily PRN, Rollene Rotunda, John D, PA-C   polyethylene glycol (MIRALAX / GLYCOLAX) packet 17 g, 17 g, Per Tube, Daily, Maryjane Hurter, MD   propofol (DIPRIVAN) 1000 MG/100ML infusion, 0-50 mcg/kg/min, Intravenous, Continuous, Maryjane Hurter, MD, Last Rate: 10.06 mL/hr at 04/04/21 0500, 20 mcg/kg/min at 04/04/21 0500   sodium bicarbonate 150 mEq in dextrose 5 % 1,150 mL infusion, , Intravenous, Continuous, Rollene Rotunda, John D, PA-C, Last Rate: 30 mL/hr at 04/04/21 0500, Infusion Verify at 04/04/21 0500   sodium bicarbonate injection, , Intravenous, PRN, Long, Wonda Olds, MD, 50 mEq at 04/03/21 0032   thiamine tablet 100 mg, 100 mg, Per Tube, Daily, Andres Labrum D, PA-C, 100 mg at 04/03/21 1201  Facility-Administered Medications Ordered in Other Encounters:    midazolam (VERSED) 5 MG/5ML injection, , , Anesthesia Intra-op, Sammie Bench, CRNA, 2 mg at 04/11/18 1042 Labs CBC    Component Value Date/Time   WBC 3.2 (L) 04/04/2021 0500   RBC 3.28 (L) 04/04/2021 0500   HGB 11.9 (L) 04/04/2021 0638   HGB 11.0 (L) 03/27/2018 1420   HCT 35.0 (L) 04/04/2021 0638   HCT 32.7 (L) 03/27/2018 1420   PLT 75 (L) 04/04/2021 0500   PLT 174 03/27/2018 1420   MCV 101.5 (H) 04/04/2021 0500   MCV 101 (H) 03/27/2018 1420   MCH 33.8 04/04/2021 0500   MCHC 33.3 04/04/2021 0500   RDW 17.9 (H) 04/04/2021 0500   RDW 14.6 03/27/2018 1420   LYMPHSABS 0.2 (L) 04/04/2021 0500   LYMPHSABS 0.9 03/27/2018 1420   MONOABS 0.5 04/04/2021 0500   EOSABS 0.0 04/04/2021 0500   EOSABS 0.1 03/27/2018 1420   BASOSABS 0.0 04/04/2021 0500   BASOSABS 0.0 03/27/2018 1420    CMP     Component Value Date/Time   NA 136 04/04/2021 0638   NA 140 05/15/2018  1632   K 4.2 04/04/2021 0638   CL 91 (L) 04/04/2021 0500   CO2 27 04/04/2021 0500   GLUCOSE 387 (H) 04/04/2021 0500   BUN 51 (H) 04/04/2021 0500   BUN 38 (H) 05/15/2018 1632   CREATININE 10.60 (H) 04/04/2021 0500   CALCIUM 7.4 (L) 04/04/2021 0500   PROT 5.4 (L) 04/04/2021 0500   PROT 7.6 05/15/2018 1632   ALBUMIN 2.2 (L) 04/04/2021 0500   ALBUMIN 3.7 05/15/2018 1632   AST 19 04/04/2021 0500   ALT 18 04/04/2021 0500   ALKPHOS 42 04/04/2021 0500   BILITOT 0.9 04/04/2021 0500   BILITOT 0.3 05/15/2018 1632   GFRNONAA 5 (L) 04/04/2021 0500   GFRAA 6 (L) 01/18/2020 1232    glycosylated hemoglobin  Lipid Panel     Component Value Date/Time   CHOL 158 03/27/2018 1420   TRIG 649 (H) 04/04/2021 0500   HDL 74 03/27/2018 1420   CHOLHDL 2.1 03/27/2018 1420   CHOLHDL 2.0 Ratio 10/04/2007 2023   VLDL 31 10/04/2007 2023   LDLCALC 69 03/27/2018 1420     Imaging I have reviewed images in epic and the results pertinent to this consultation are: CT head with no acute changes EEG 04/03/2021-diffuse encephalopathy EEG overnight 04/03/2021 to 04/04/2021-no seizures.  Diffuse  encephalopathy.  Assessment:  65 year old with past medical history of coronary artery disease, COPD, ESRD on hemodialysis Monday Wednesday Friday, hepatitis C, hypertension, peripheral arterial disease, tobacco use, substance abuse and type 2 diabetes presented for evaluation of altered mental status-found down on a welfare check unresponsive, hypothermic and hypoglycemic with a weak thready pulse and eventually lost pulse requiring CPR for about 20 minutes prior to ROSC. On arrival to the ED was intubated airway protection. Neurology consulted for generalized twitching/myoclonic jerking movements Initial exam consistent with generalized body myoclonus. Symptoms have started to improve much after his dialysis and as the primary team corrects his toxic metabolic derangements. He was also started on renally dosed Keppra yesterday. His exam is somewhat improving-I did not appreciate any of those myoclonic jerks.  It also looks like he has some ability to follow commands inconsistently and I would imagine that this would improve as his toxic metabolic derangements improved. 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 Discontinue EEG. We will continue to follow along and and consider obtaining an MRI if he does not improve clinically in terms of his mentation or has any focal findings.  -- Amie Portland, MD Neurologist [facility] Pager: [phone]  CRITICAL CARE ATTESTATION Performed by: Amie Portland, MD Total critical care time: 33 minutes Critical  care time was exclusive of separately billable procedures and treating other patients and/or supervising APPs/Residents/Students Critical care was necessary to treat or prevent imminent or life-threatening deterioration due to toxic metabolic encephalopathy This patient is critically ill and at significant risk for neurological worsening and/or death and care requires constant monitoring. Critical care was time spent personally by me on the following activities: development of treatment plan with patient and/or surrogate as well as nursing, discussions with consultants, evaluation of patient's response to treatment, examination of patient, obtaining history from patient or surrogate, ordering  and performing treatments and interventions, ordering and review of laboratory studies, ordering and review of radiographic studies, pulse oximetry, re-evaluation of patient's condition, participation in multidisciplinary rounds and medical decision making of high complexity in the care of this patient.

## 2021-04-04 NOTE — Progress Notes (Signed)
eLink Physician-Brief Progress Note Patient Name: Raymond [name] DOB: 09/15/1955 MRN: [id number]   Date of Service  04/04/2021  HPI/Events of Note  Notified of hypoglycemic episodes despite being on D10 at 30/hr Too lethargic to take PO as per bedside team assessment On review fluid balance he is not overly positive  eICU Interventions  Increased D10 to 50 cc/hr     Intervention Category Intermediate Interventions: Other:  [name] 04/04/2021, 7:55 PM

## 2021-04-05 ENCOUNTER — Inpatient Hospital Stay (HOSPITAL_COMMUNITY): Payer: 59

## 2021-04-05 DIAGNOSIS — R609 Edema, unspecified: Secondary | ICD-10-CM

## 2021-04-05 DIAGNOSIS — D61818 Other pancytopenia: Secondary | ICD-10-CM

## 2021-04-05 LAB — COMPREHENSIVE METABOLIC PANEL
ALT: 20 U/L (ref 0–44)
AST: 18 U/L (ref 15–41)
Albumin: 2.6 g/dL — ABNORMAL LOW (ref 3.5–5.0)
Alkaline Phosphatase: 51 U/L (ref 38–126)
Anion gap: 17 — ABNORMAL HIGH (ref 5–15)
BUN: 69 mg/dL — ABNORMAL HIGH (ref 8–23)
CO2: 24 mmol/L (ref 22–32)
Calcium: 8.4 mg/dL — ABNORMAL LOW (ref 8.9–10.3)
Chloride: 94 mmol/L — ABNORMAL LOW (ref 98–111)
Creatinine, Ser: 12.35 mg/dL — ABNORMAL HIGH (ref 0.61–1.24)
GFR, Estimated: 4 mL/min — ABNORMAL LOW (ref >60)
Glucose, Bld: 92 mg/dL (ref 70–99)
Potassium: 5.1 mmol/L (ref 3.5–5.1)
Sodium: 135 mmol/L (ref 135–145)
Total Bilirubin: 1 mg/dL (ref 0.3–1.2)
Total Protein: 6.7 g/dL (ref 6.5–8.1)

## 2021-04-05 LAB — CBC
HCT: 30.3 % — ABNORMAL LOW (ref 39.0–52.0)
Hemoglobin: 9.7 g/dL — ABNORMAL LOW (ref 13.0–17.0)
MCH: 32.7 pg (ref 26.0–34.0)
MCHC: 32 g/dL (ref 30.0–36.0)
MCV: 102 fL — ABNORMAL HIGH (ref 80.0–100.0)
Platelets: 69 10*3/uL — ABNORMAL LOW (ref 150–400)
RBC: 2.97 MIL/uL — ABNORMAL LOW (ref 4.22–5.81)
RDW: 17.6 % — ABNORMAL HIGH (ref 11.5–15.5)
WBC: 5 10*3/uL (ref 4.0–10.5)
nRBC: 0 % (ref 0.0–0.2)

## 2021-04-05 LAB — GLUCOSE, CAPILLARY
Glucose-Capillary: 100 mg/dL — ABNORMAL HIGH (ref 70–99)
Glucose-Capillary: 109 mg/dL — ABNORMAL HIGH (ref 70–99)
Glucose-Capillary: 61 mg/dL — ABNORMAL LOW (ref 70–99)
Glucose-Capillary: 75 mg/dL (ref 70–99)
Glucose-Capillary: 77 mg/dL (ref 70–99)
Glucose-Capillary: 79 mg/dL (ref 70–99)
Glucose-Capillary: 84 mg/dL (ref 70–99)

## 2021-04-05 LAB — PHOSPHORUS: Phosphorus: 8.6 mg/dL — ABNORMAL HIGH (ref 2.5–4.6)

## 2021-04-05 LAB — MAGNESIUM: Magnesium: 2 mg/dL (ref 1.7–2.4)

## 2021-04-05 IMAGING — CT CT HEAD W/O CM
4 series · 16 of 47 positions shown, 18 images · non-contrast
Comparison: CT head [DATE], MR head [DATE]

CLINICAL DATA: Post cardiac arrest, altered mental status

EXAM:
CT HEAD WITHOUT CONTRAST
TECHNIQUE: Contiguous axial images were obtained from the base of the skull
through the vertex without intravenous contrast.

[Series 3: head wo · axial · 0.48mm/px · z∈[-64,+56]mm · 7 of 34 slices shown, 9 images]
[im 5/34  brain]
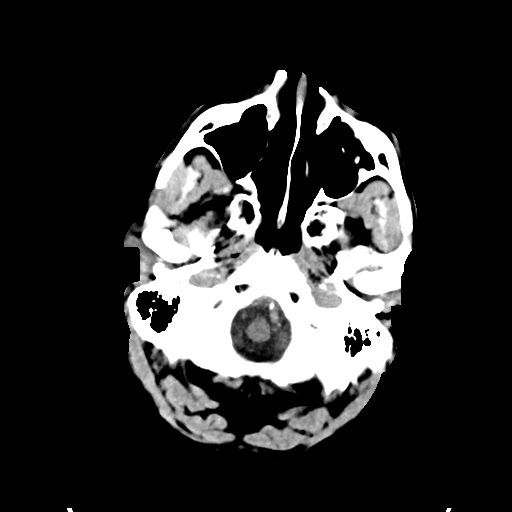
[im 5/34  bone]
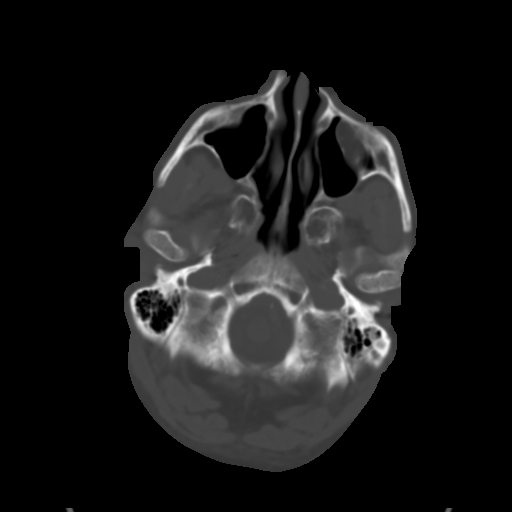
[im 9/34  brain]
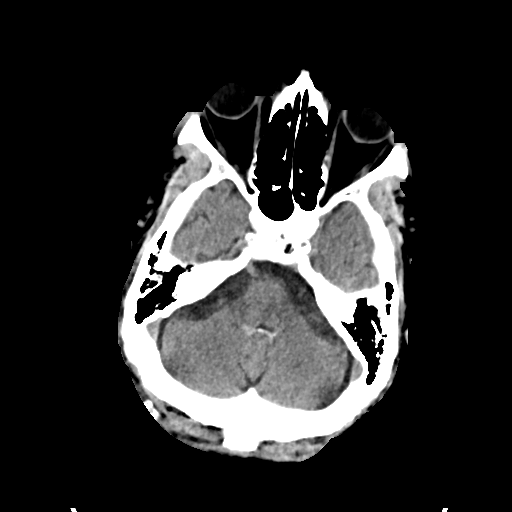
[im 13/34  brain]
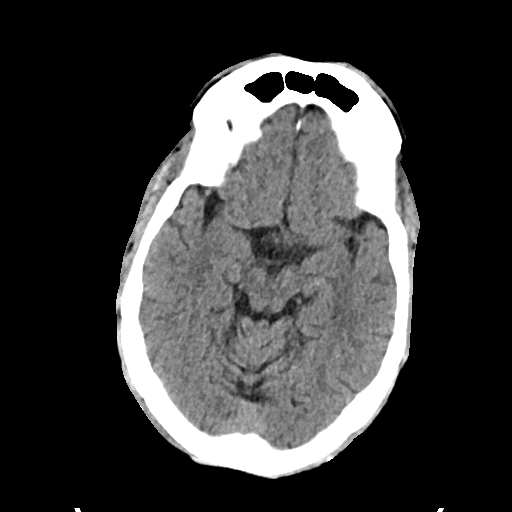
[im 17/34  brain]
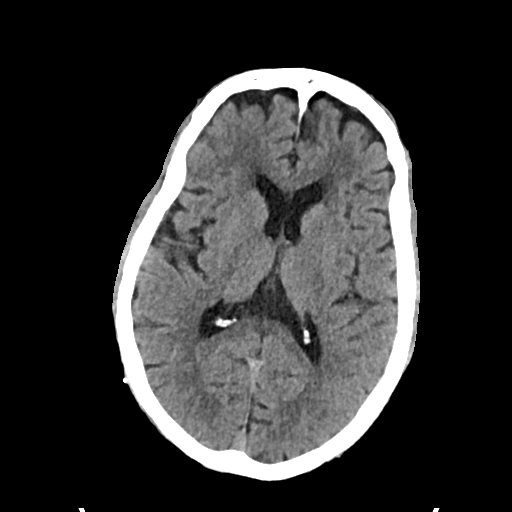
[im 21/34  brain]
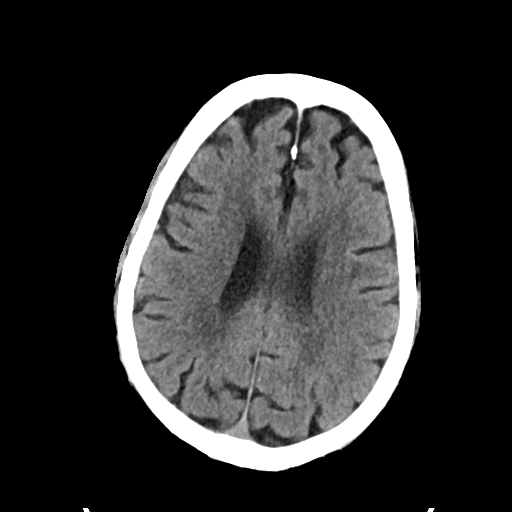
[im 21/34  bone]
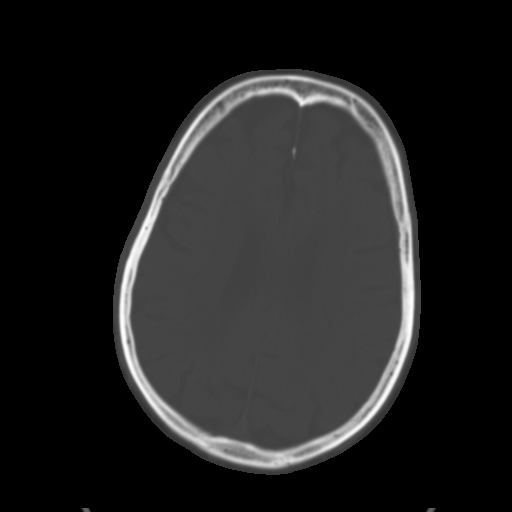
[im 25/34  brain]
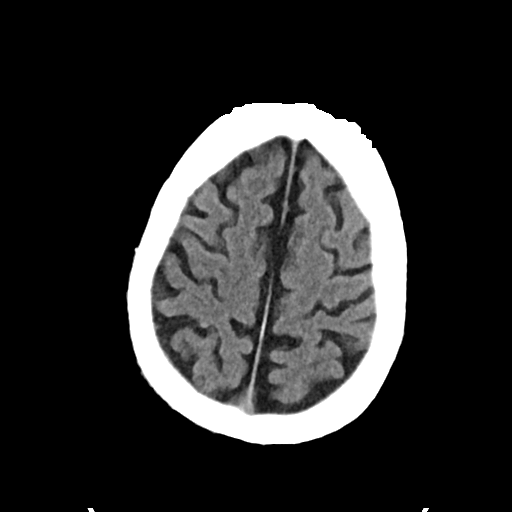
[im 29/34  brain]
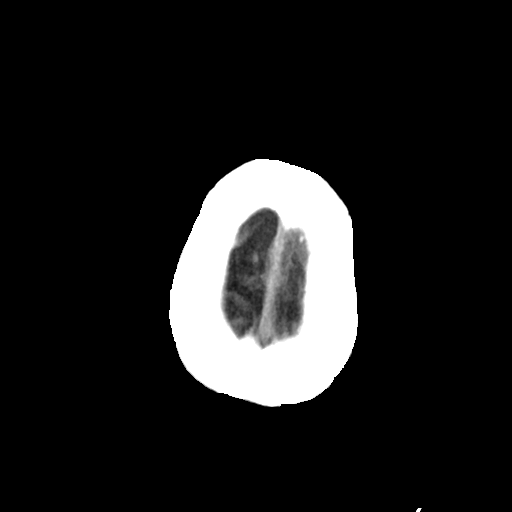

[Series 4: head bone · axial · 0.48mm/px · z∈[-68,-34]mm · 3 of 85 slices shown]
[im 9/85  bone]
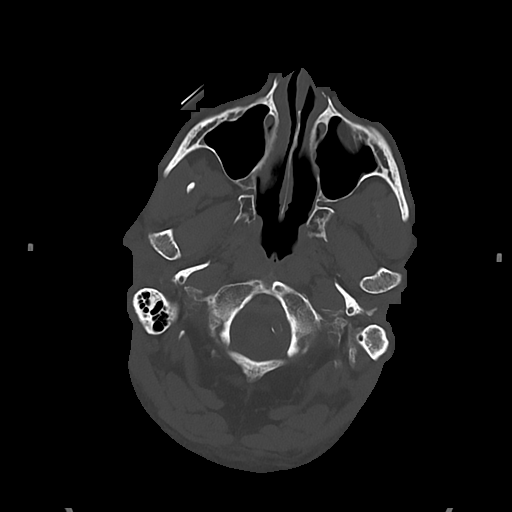
[im 17/85  bone]
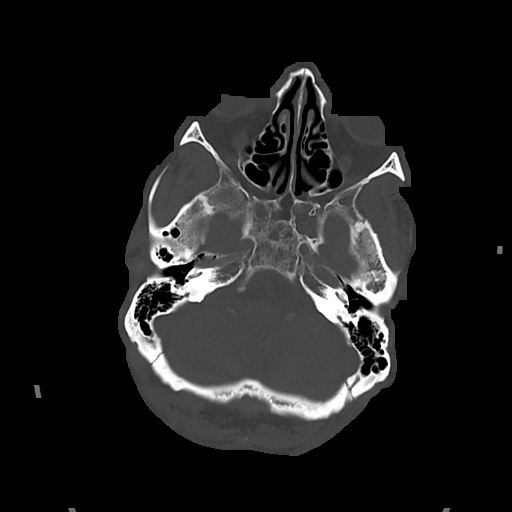
[im 26/85  bone]
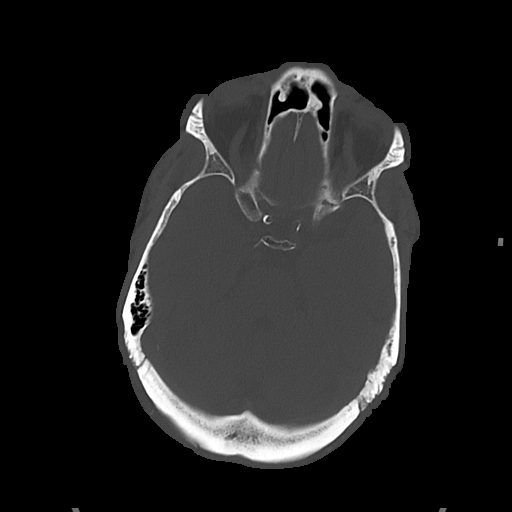

[Series 5: cor soft · coronal · 0.35mm/px · 3 of 70 slices shown]
[im 24/70  brain]
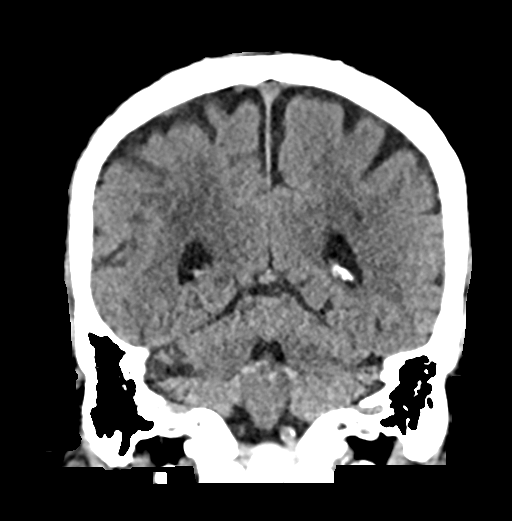
[im 31/70  brain]
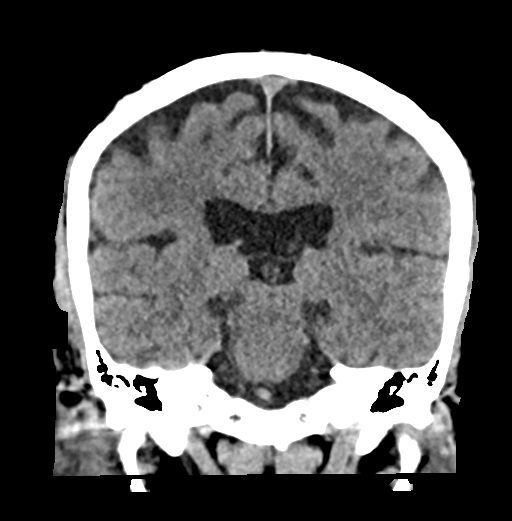
[im 39/70  brain]
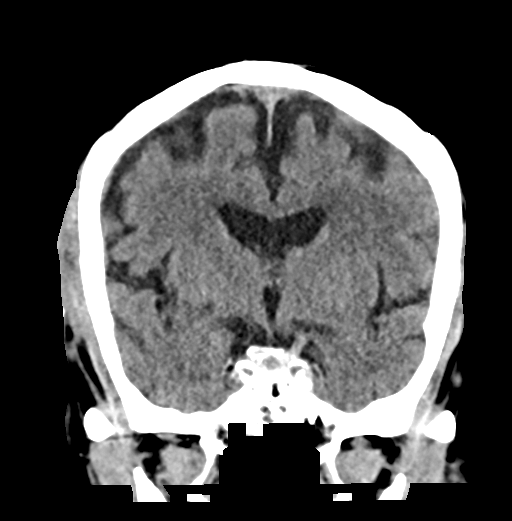

[Series 6: sag soft · sagittal · 0.33mm/px · 3 of 55 slices shown]
[im 19/55  brain]
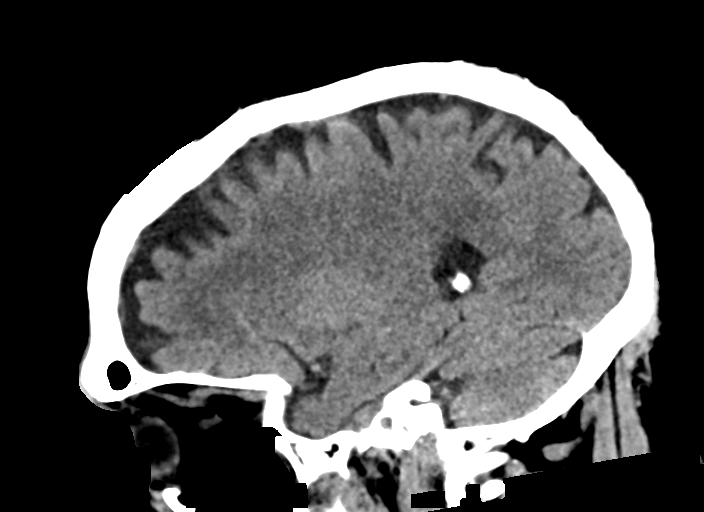
[im 28/55  brain]
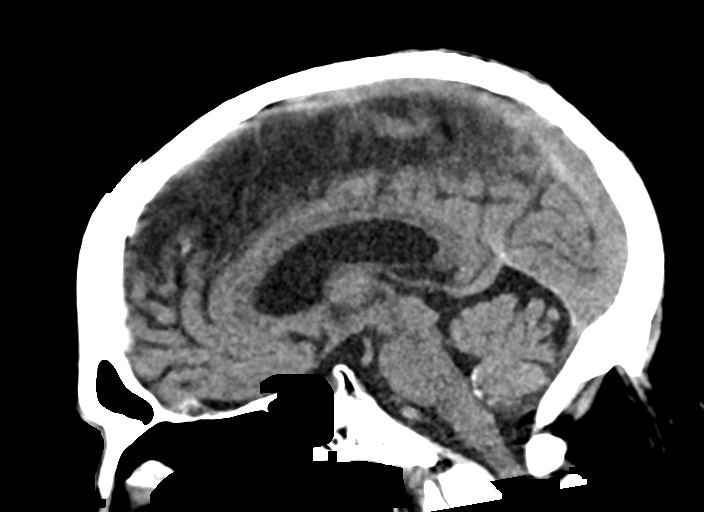
[im 37/55  brain]
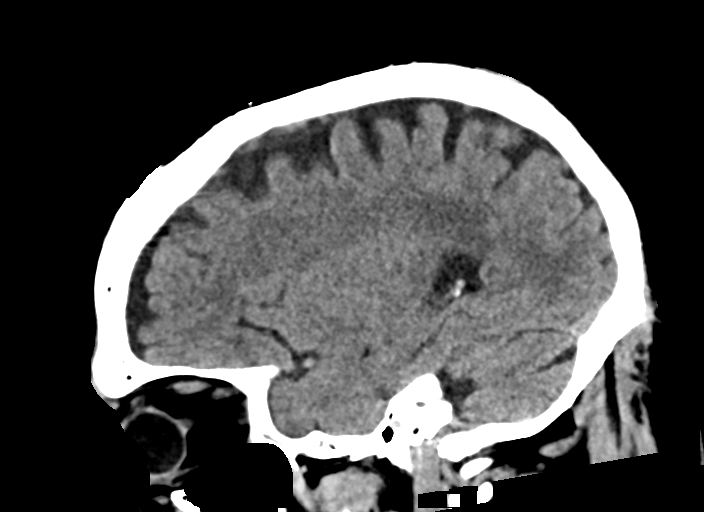

[16 of 47 positions shown; findings below may reference images not displayed]

FINDINGS: Brain: There is no acute intracranial hemorrhage, extra-axial fluid
collection, or acute infarct.

Gray-white differentiation is preserved. The CSF spaces and basal
cisterns are patent.

There is mild global parenchymal volume loss. Foci of hypodensity in
the subcortical and periventricular white matter likely reflects
sequela of chronic white matter microangiopathy, unchanged.

There is no mass lesion.  There is no midline shift.

Vascular: There is calcification of the bilateral cavernous ICAs and
vertebral arteries.

Skull: Normal. Negative for fracture or focal lesion. There is a
remote left orbital floor fracture.

Sinuses/Orbits: The imaged paranasal sinuses are clear. A right lens
implant is in place. The globes are otherwise unremarkable. There is
herniation of left orbital fat related to the remote orbital floor
fracture.

Other: None.
IMPRESSION: No CT evidence of anoxic brain injury or other acute intracranial
pathology. MRI may be considered for more sensitive evaluation as
indicated.

## 2021-04-05 MED ORDER — RENA-VITE PO TABS
1.0000 | ORAL_TABLET | Freq: Every day | ORAL | Status: DC
  Administered 2021-04-05 – 2021-04-14 (×9): 1 via ORAL
  Filled 2021-04-05 (×10): qty 1

## 2021-04-05 MED ORDER — BOOST / RESOURCE BREEZE PO LIQD CUSTOM
1.0000 | Freq: Three times a day (TID) | ORAL | Status: DC
  Administered 2021-04-05 – 2021-04-07 (×5): 1 via ORAL

## 2021-04-05 MED ORDER — PANTOPRAZOLE SODIUM 40 MG PO TBEC
40.0000 mg | DELAYED_RELEASE_TABLET | Freq: Every day | ORAL | Status: DC
  Administered 2021-04-05 – 2021-04-14 (×9): 40 mg via ORAL
  Filled 2021-04-05 (×9): qty 1

## 2021-04-05 NOTE — Progress Notes (Signed)
Progress Note  Patient Name: Raymond [name] Date of Encounter: 04/05/2021  Primary Cardiologist: Jenkins Rouge, MD   Subjective   No chest pain.  Inpatient Medications    Scheduled Meds:  budesonide (PULMICORT) nebulizer solution  0.25 mg Nebulization BID   chlorhexidine gluconate (MEDLINE KIT)  15 mL Mouth Rinse BID   Chlorhexidine Gluconate Cloth  6 each Topical I3382   folic acid  1 mg Oral Daily   heparin injection (subcutaneous)  5,000 Units Subcutaneous Q8H   insulin aspart  0-6 Units Subcutaneous Q4H   ipratropium-albuterol  3 mL Nebulization BID   pantoprazole (PROTONIX) IV  40 mg Intravenous QHS   thiamine  100 mg Oral Daily   Continuous Infusions:  sodium chloride 10 mL/hr at 04/05/21 0700   sodium chloride     sodium chloride     dextrose 50 mL/hr at 04/05/21 0700   piperacillin-tazobactam (ZOSYN)  IV Stopped (04/05/21 0557)   PRN Meds: sodium chloride, sodium chloride, sodium chloride, alteplase, calcium chloride, docusate sodium, heparin, hydrALAZINE, lidocaine (PF), lidocaine-prilocaine, pentafluoroprop-tetrafluoroeth, polyethylene glycol   Vital Signs    Vitals:   04/05/21 0400 04/05/21 0500 04/05/21 0600 04/05/21 0700  BP: 134/61 (!) 111/35 122/60 (!) 127/58  Pulse: 86 83 89 85  Resp: (!) _0 Temp:      TempSrc:      SpO2: 99% 99% 100% 100%  Weight:      Height:        Intake/Output Summary (Last 24 hours) at 04/05/2021 0740 Last data filed at 04/05/2021 0700 Gross per 24 hour  Intake 1365.56 ml  Output --  Net 1365.56 ml   Filed Weights   04/03/21 0255 04/04/21 0400  Weight: 83.8 kg 78.6 kg    Telemetry    SR - Personally Reviewed  ECG    SR - Personally Reviewed  Physical Exam   GEN: No acute distress.   Neck: No JVD Cardiac: regular rhythm, normal rate, no murmurs, rubs, or gallops.  Respiratory: clear GI: Soft, nontender, non-distended  MS: No edema; No deformity. Neuro/Psych: alert, moves extremities  Labs     Chemistry Recent Labs  Lab 04/03/21 0025 04/03/21 0046 04/03/21 2100 04/04/21 0113 04/04/21 0500 04/04/21 0638  NA 142   < > 141 137 135 136  K 5.8*   < > 5.1 3.0* 3.3* 4.2  CL 103   < > 102 99 91*  --   CO2 14*   < > 19* 24 27  --   GLUCOSE 103*   < > 173* 126* 387*  --   BUN 100*   < > 115* 39* 51*  --   CREATININE 20.48*   < > 20.00* 8.18* 10.60*  --   CALCIUM 9.3   < > 8.9 9.0 7.4*  --   PROT 7.1  --   --   --  5.4*  --   ALBUMIN 3.1*  --   --   --  2.2*  --   AST 29  --   --   --  19  --   ALT 18  --   --   --  18  --   ALKPHOS 55  --   --   --  42  --   BILITOT 0.7  --   --   --  0.9  --   GFRNONAA 2*   < > 2* 7* 5*  --   ANIONGAP 25*   < >  20* 14 17*  --    < > = values in this interval not displayed.     Hematology Recent Labs  Lab 04/03/21 0025 04/03/21 0046 04/03/21 0530 04/04/21 0500 04/04/21 0638  WBC 1.5*  --  0.7* 3.2*  --   RBC 3.40*  --  3.42* 3.28*  --   HGB 11.6*   < > 11.4* 11.1* 11.9*  HCT 39.0   < > 37.0* 33.3* 35.0*  MCV 114.7*  --  108.2* 101.5*  --   MCH 34.1*  --  33.3 33.8  --   MCHC 29.7*  --  30.8 33.3  --   RDW 18.6*  --  18.3* 17.9*  --   PLT 62*  --  57* 75*  --    < > = values in this interval not displayed.    Cardiac EnzymesNo results for input(s): TROPONINI in the last 168 hours. No results for input(s): TROPIPOC in the last 168 hours.   BNPNo results for input(s): BNP, PROBNP in the last 168 hours.   DDimer No results for input(s): DDIMER in the last 168 hours.   Radiology    DG CHEST PORT 1 VIEW  Result Date: 04/03/2021 CLINICAL DATA:  65 year old male status post orogastric tube placement. EXAM: PORTABLE CHEST - 1 VIEW COMPARISON:  Earlier the same day. FINDINGS: Interval insertion of gastric decompression tube with the tip in the left upper quadrant. Unchanged position of the indwelling endotracheal tube with the tip in the midthoracic trachea. Defibrillator pads remain in place. The mediastinal contours are within  normal limits. Unchanged cardiomegaly. Interval increased scattered bilateral hazy pulmonary opacities. No significant pleural effusion or evidence of pneumothorax. No acute osseous abnormality. IMPRESSION: 1. Interval insertion of gastric decompression tube with the tip in the left upper quadrant, likely within the stomach. 2. Interval development of mild pulmonary edema. 3. Unchanged cardiomegaly. Electronically Signed   By: Ruthann Cancer M.D.   On: 04/03/2021 11:31   EEG adult  Result Date: 04/03/2021 Lora Havens, MD     04/03/2021  9:45 AM Patient Name: Raymond [name] MRN: [id number] Epilepsy Attending: Lora Havens Referring Physician/Provider: Dr Amie Portland Date: 04/03/2021 Duration: 22.05 mins Patient history: 65yo M s/p cardiac arrest. EEG to evaluate for seizure Level of alertness:  comatose AEDs during EEG study: LEV Technical aspects: This EEG study was done with scalp electrodes positioned according to the 10-20 International system of electrode placement. Electrical activity was acquired at a sampling rate of 500Hz and reviewed with a high frequency filter of 70Hz and a low frequency filter of 1Hz. EEG data were recorded continuously and digitally stored. Description: EEG showed continuous generalized 3 to 5 Hz theta-delta slowing admixed with 15-18Hz frontocentral beta activity. Hyperventilation and photic stimulation were not performed.   ABNORMALITY - Continuous slow, generalized IMPRESSION: This study is suggestive of severe diffuse encephalopathy, nonspecific etiology. No seizures or epileptiform discharges were seen throughout the recording. Priyanka Barbra Sarks   Overnight EEG with video  Result Date: 04/04/2021 Lora Havens, MD     04/04/2021  9:25 AM MRN: [id number] Epilepsy Attending: Lora Havens Referring Physician/Provider: Dr Amie Portland Duration: 04/03/2021 1608 to 04/04/2021 0915  Patient history: 65yo M s/p cardiac arrest. EEG to evaluate for seizure  Level of  alertness:  comatose  AEDs during EEG study: LEV, propofol  Technical aspects: This EEG study was done with scalp electrodes positioned according to the 10-20 International system of electrode placement. Dealer  activity was acquired at a sampling rate of 500Hz and reviewed with a high frequency filter of 70Hz and a low frequency filter of 1Hz. EEG data were recorded continuously and digitally stored.  Description: EEG showed continuous generalized 3 to 5 Hz theta-delta slowing admixed with 15-18Hz frontocentral beta activity. Hyperventilation and photic stimulation were not performed.    ABNORMALITY - Continuous slow, generalized  IMPRESSION: This study is suggestive of severe diffuse encephalopathy, nonspecific etiology. No seizures or epileptiform discharges were seen throughout the recording.  Lora Havens   ECHOCARDIOGRAM COMPLETE  Result Date: 04/04/2021    ECHOCARDIOGRAM REPORT   Patient Name:   Raymond [name] Raymond [name] Date of Exam: 04/04/2021 Medical Rec #:  [id number]     Height:       67.0 in Accession #:    [id number]    Weight:       173.3 lb Date of Birth:  1956-03-07     BSA:          1.903 m Patient Age:    9 years      BP:           108/47 mmHg Patient Gender: M             HR:           86 bpm. Exam Location:  Inpatient Procedure: 2D Echo Indications:    R94.31 Abnormal EKG. Cardiac arrest.  History:        Patient has prior history of Echocardiogram examinations, most                 recent 01/27/2021. Abnormal ECG, COPD, Arrythmias:Atrial Flutter,                 Signs/Symptoms:Shortness of Breath, Dyspnea and Altered Mental                 Status; Risk Factors:Diabetes and Hypertension. ESRD. Cardiac                 arrest.  Sonographer:    Roseanna Rainbow RDCS Referring Phys: [id number] Chrystie Nose PAYNE  Sonographer Comments: Echo performed with patient supine and on artificial respirator. IMPRESSIONS  1. Left ventricular ejection fraction, by estimation, is 50 to 55%. The left ventricle has low normal  function. The left ventricle demonstrates global hypokinesis. The left ventricular internal cavity size was mildly dilated. There is severe left ventricular hypertrophy. Left ventricular diastolic parameters are indeterminate. The average left ventricular global longitudinal strain is -19.1 %. The global longitudinal strain is normal.  2. Right ventricular systolic function is normal. The right ventricular size is normal. There is moderately elevated pulmonary artery systolic pressure.  3. Left atrial size was moderately dilated.  4. Right atrial size was moderately dilated.  5. The mitral valve is abnormal. Trivial mitral valve regurgitation. No evidence of mitral stenosis.  6. Tricuspid valve regurgitation is moderate.  7. The aortic valve is tricuspid. There is moderate calcification of the aortic valve. Aortic valve regurgitation is not visualized. Mild to moderate aortic valve sclerosis/calcification is present, without any evidence of aortic stenosis.  8. Aortic dilatation noted. There is moderate dilatation of the ascending aorta, measuring 45 mm.  9. The inferior vena cava is normal in size with greater than 50% respiratory variability, suggesting right atrial pressure of 3 mmHg. FINDINGS  Left Ventricle: Left ventricular ejection fraction, by estimation, is 50 to 55%. The left ventricle has low normal function. The left ventricle demonstrates global hypokinesis. The  average left ventricular global longitudinal strain is -19.1 %. The global longitudinal strain is normal. The left ventricular internal cavity size was mildly dilated. There is severe left ventricular hypertrophy. Left ventricular diastolic parameters are indeterminate. Right Ventricle: The right ventricular size is normal. No increase in right ventricular wall thickness. Right ventricular systolic function is normal. There is moderately elevated pulmonary artery systolic pressure. The tricuspid regurgitant velocity is 3.19 m/s, and with an  assumed right atrial pressure of 15 mmHg, the estimated right ventricular systolic pressure is 62.2 mmHg. Left Atrium: Left atrial size was moderately dilated. Right Atrium: Right atrial size was moderately dilated. Pericardium: There is no evidence of pericardial effusion. Mitral Valve: The mitral valve is abnormal. There is mild thickening of the mitral valve leaflet(s). There is mild calcification of the mitral valve leaflet(s). Mild mitral annular calcification. Trivial mitral valve regurgitation. No evidence of mitral valve stenosis. Tricuspid Valve: The tricuspid valve is normal in structure. Tricuspid valve regurgitation is moderate . No evidence of tricuspid stenosis. Aortic Valve: The aortic valve is tricuspid. There is moderate calcification of the aortic valve. Aortic valve regurgitation is not visualized. Mild to moderate aortic valve sclerosis/calcification is present, without any evidence of aortic stenosis. Pulmonic Valve: The pulmonic valve was normal in structure. Pulmonic valve regurgitation is not visualized. No evidence of pulmonic stenosis. Aorta: Aortic dilatation noted. There is moderate dilatation of the ascending aorta, measuring 45 mm. Venous: The inferior vena cava is normal in size with greater than 50% respiratory variability, suggesting right atrial pressure of 3 mmHg. IAS/Shunts: No atrial level shunt detected by color flow Doppler.  LEFT VENTRICLE PLAX 2D LVIDd:         4.40 cm     Diastology LVIDs:         3.40 cm     LV e' medial:    7.72 cm/s LV PW:         2.30 cm     LV E/e' medial:  9.9 LV IVS:        1.80 cm     LV e' lateral:   8.81 cm/s LVOT diam:     2.20 cm     LV E/e' lateral: 8.6 LV SV:         70 LV SV Index:   37          2D Longitudinal Strain LVOT Area:     3.80 cm    2D Strain GLS Avg:     -19.1 %  LV Volumes (MOD) LV vol d, MOD A2C: 96.7 ml LV vol d, MOD A4C: 64.1 ml LV vol s, MOD A2C: 44.3 ml LV vol s, MOD A4C: 33.9 ml LV SV MOD A2C:     52.4 ml LV SV MOD A4C:      64.1 ml LV SV MOD BP:      43.8 ml RIGHT VENTRICLE         IVC TAPSE (M-mode): 1.5 cm  IVC diam: 2.40 cm LEFT ATRIUM             Index       RIGHT ATRIUM           Index LA diam:        4.30 cm 2.26 cm/m  RA Area:     23.80 cm LA Vol (A2C):   39.5 ml 20.76 ml/m RA Volume:   72.40 ml  38.05 ml/m LA Vol (A4C):   71.9 ml 37.79 ml/m LA Biplane Vol:  55.8 ml 29.33 ml/m  AORTIC VALVE             PULMONIC VALVE LVOT Vmax:   117.00 cm/s PR End Diast Vel: 2.11 msec LVOT Vmean:  68.400 cm/s LVOT VTI:    0.183 m  AORTA Ao Root diam: 3.90 cm Ao Asc diam:  4.50 cm MITRAL VALVE               TRICUSPID VALVE MV Area (PHT): 3.68 cm    TR Peak grad:   40.7 mmHg MV Decel Time: 206 msec    TR Vmax:        319.00 cm/s MV E velocity: 76.05 cm/s MV A velocity: 86.50 cm/s  SHUNTS MV E/A ratio:  0.88        Systemic VTI:  0.18 m                            Systemic Diam: 2.20 cm Jenkins Rouge MD Electronically signed by Jenkins Rouge MD Signature Date/Time: 04/04/2021/10:49:42 AM    Final     Cardiac Studies   Echo EF 50-55%, normal strain. RV normal. Moderate biatrial enlargement, RVSP 56 mmHg. Moderate TR, trivial MR. AV sclerosis. Aorta dilation, 45 mm aortic aneurysm at ascending aorta.  Patient Profile     65 y.o. male with COPD, end-stage renal disease on intermittent hemodialysis, HCV, hypertension, substance abuse, thyroid disease, diabetes mellitus type 2, and mild CAD with elevated troponin and PEA arrest with hypoglycemia and hypothermia.  Assessment & Plan   Principal Problem:   Cardiac arrest [facility]) Active Problems:   ESRD (end stage renal disease) (HCC)   Hyperkalemia   Hypoglycemia   Pancytopenia (HCC)   Anisocoria   HFrEF (heart failure with reduced ejection fraction) (HCC)   Hypothermia not due to cold exposure   Junctional bradycardia   Telemetry today shows sinus rhythm.  QT interval remains borderline prolonged but improved from prior ECGs, 493 ms.  Would check another ECG in the morning for  comparison.  Troponin elevation thought secondary to cardiac arrest in the setting of end-stage renal disease.  Dilated aorta to be followed as an outpatient.      For questions or updates, please contact [place] Please consult [email or web address] for contact info under        Signed, Elouise Munroe, MD  04/05/2021, 7:40 AM

## 2021-04-05 NOTE — Evaluation (Addendum)
Physical Therapy Evaluation Patient Details Name: Raymond [name] MRN: [id number] DOB: Feb 27, 1956 Today's Date: 04/05/2021  History of Present Illness  65 y.o. male admitted 9/24 with initial EMS call for AMS and hypoglycemia with PEA arrest in field and CPR with ROSC after 20 min. Intubated 9/24-9/25. PMhx; COPD, ESRD on iHD, hep C, PAD, HTN, substance abuse, thyroid disease, DM2, mild CAD  Clinical Impression  Pt with flat affect, decreased awareness, memory and cognition. Pt with pain in right knee and left ankle/foot with pt reporting gout hx and RN made aware. Pt reports no pain in chest and unaware of situation. Per chart pt lives alone but when asked about a spouse pt also stating yes so unable to confirm PLOf or home setup. Pt with decreased strength, balance, transfers, cognition and function also limited by pain who will benefit from acute therapy to maximize mobility, safety and function to decrease burden of care.   HR 81 SpO2 95% on RA       Recommendations for follow up therapy are one component of a multi-disciplinary discharge planning process, led by the attending physician.  Recommendations may be updated based on patient status, additional functional criteria and insurance authorization.  Follow Up Recommendations CIR;Supervision/Assistance - 24 hour    Equipment Recommendations  Other (comment) (TBD)    Recommendations for Other Services OT consult;Rehab consult     Precautions / Restrictions Precautions Precautions: Fall Precaution Comments: ? gout Restrictions Weight Bearing Restrictions: No      Mobility  Bed Mobility Overal bed mobility: Needs Assistance Bed Mobility: Supine to Sit     Supine to sit: Min assist;HOB elevated     General bed mobility comments: HOb 10 degrees with min assist to elevate trunk, increased time, multimodal cues    Transfers Overall transfer level: Needs assistance   Transfers: Sit to/from Stand;Lateral/Scoot  Transfers Sit to Stand: Mod assist;+2 physical assistance        Lateral/Scoot Transfers: Min assist General transfer comment: mod +2 to attempt standing however pt maintaining knee flexion and unable to bear weight on LLE due to pain. Pt then sat and transitioned to a scooting lateral transfer with drop arm toward right with min assist and cues  Ambulation/Gait             General Gait Details: unable at this time  Stairs            Wheelchair Mobility    Modified Rankin (Stroke Patients Only)       Balance Overall balance assessment: Needs assistance Sitting-balance support: Feet supported;No upper extremity supported Sitting balance-Leahy Scale: Fair Sitting balance - Comments: EOb without physical assist   Standing balance support: Bilateral upper extremity supported Standing balance-Leahy Scale: Poor Standing balance comment: bil UE support with inability to place LLE on floor due to pain                             Pertinent Vitals/Pain Pain Assessment: Faces Pain Score: 6  Pain Location: right knee and left foot/ankle to touch with noted edema and pt reports hx of gout Pain Descriptors / Indicators: Aching;Grimacing;Constant;Guarding Pain Intervention(s): Limited activity within patient's tolerance;Monitored during session;Repositioned    Home Living Family/patient expects to be discharged to:: Private residence Living Arrangements: Alone Available Help at Discharge: Family;Friend(s);Available PRN/intermittently   Home Access: Elevator     Home Layout: One level Home Equipment: Grab bars - toilet;Grab bars - tub/shower;Walker - 2  wheels;Cane - single point Additional Comments: Home setup and PLOF taken from chart as pt unable to provide and will need to clarify accuracy    Prior Function Level of Independence: Independent with assistive device(s)         Comments: independent with ADLs and iADLs. Uses RW for community ambulation.      Hand Dominance        Extremity/Trunk Assessment   Upper Extremity Assessment Upper Extremity Assessment: Generalized weakness    Lower Extremity Assessment Lower Extremity Assessment: Generalized weakness;LLE deficits/detail;RLE deficits/detail RLE Deficits / Details: pt with pain to touch at right knee and calf LLE Deficits / Details: pain to touch throughout ankle and foot with inability to bear weight in standing and noted edema    Cervical / Trunk Assessment Cervical / Trunk Assessment: Kyphotic  Communication      Cognition Arousal/Alertness: Awake/alert Behavior During Therapy: Flat affect Overall Cognitive Status: Impaired/Different from baseline Area of Impairment: Orientation;Attention;Memory;Following commands;Safety/judgement                 Orientation Level: Disoriented to;Time;Situation;Place Current Attention Level: Focused Memory: Decreased short-term memory Following Commands: Follows one step commands inconsistently;Follows multi-step commands with increased time Safety/Judgement: Decreased awareness of safety;Decreased awareness of deficits     General Comments: pt required max multimodal cues with increased time for transitions and transfers. pt with flat affect and able to state name but limited response to PLOF and home setup      General Comments      Exercises     Assessment/Plan    PT Assessment Patient needs continued PT services  PT Problem List Decreased strength;Decreased mobility;Decreased safety awareness;Decreased range of motion;Decreased activity tolerance;Decreased cognition;Decreased coordination;Decreased balance;Decreased knowledge of use of DME;Pain       PT Treatment Interventions DME instruction;Therapeutic activities;Gait training;Therapeutic exercise;Functional mobility training;Neuromuscular re-education;Balance training;Patient/family education;Cognitive remediation    PT Goals (Current goals can be found in  the Care Plan section)  Acute Rehab PT Goals Patient Stated Goal: pt agreeable to progressing mobility PT Goal Formulation: With patient Time For Goal Achievement: 04/19/21 Potential to Achieve Goals: Fair    Frequency Min 3X/week   Barriers to discharge Decreased caregiver support      Co-evaluation               AM-PAC PT "6 Clicks" Mobility  Outcome Measure Help needed turning from your back to your side while in a flat bed without using bedrails?: A Little Help needed moving from lying on your back to sitting on the side of a flat bed without using bedrails?: A Little Help needed moving to and from a bed to a chair (including a wheelchair)?: A Lot Help needed standing up from a chair using your arms (e.g., wheelchair or bedside chair)?: A Lot Help needed to walk in hospital room?: Total Help needed climbing 3-5 steps with a railing? : Total 6 Click Score: 12    End of Session Equipment Utilized During Treatment: Gait belt Activity Tolerance: Patient tolerated treatment well Patient left: in chair;with call bell/phone within reach;with chair alarm set Nurse Communication: Mobility status PT Visit Diagnosis: Other abnormalities of gait and mobility (R26.89);Muscle weakness (generalized) (M62.81)    Time: 4098-1191 PT Time Calculation (min) (ACUTE ONLY): 16 min   Charges:   PT Evaluation $PT Eval Moderate Complexity: 1 Mod          Hashem Goynes P, PT Acute Rehabilitation Services Pager: [phone] Office: [phone]   Charm Stenner B Jamier Urbas 04/05/2021, 9:21  AM

## 2021-04-05 NOTE — Progress Notes (Signed)
Patient ID: Raymond [name], male   DOB: [birth year], 65 y.o.   MRN: [id number] [facility] Progress Note   Assessment/ Plan:   1.  Acute encephalopathy: Status postcardiac arrest with likely element of hypoxic/anoxic brain injury possibly compounded by hypoglycemia.  Awake and communicative but appears confused/disoriented.  Additional management as directed by neurology. 2. ESRD: Will order for hemodialysis again tomorrow to resume his outpatient TTS schedule; he does not have any acute indications for dialysis at this time. 3. Anemia: Hemoglobin and hematocrit at acceptable range and without indications for ESA at this time.  No overt blood loss noted. 4. CKD-MBD: Significantly elevated phosphorus levels noted-likely with poor adherence to outpatient binders/diet.  Will await neurological status to allow for safe/adequate oral intake to resume binders. 5. Nutrition: Currently n.p.o. due to encephalopathy and on D10W for hypoglycemia management. 6. Hypertension: Blood pressure currently at goal, continue to follow.  Subjective:   Successfully extubated yesterday and currently on oxygen via nasal cannula.  Awakens to calling out his name and responds to questions with some confusion/disorientation that is apparent.   Objective:   BP (!) 127/58   Pulse 85   Temp 97.8 F (36.6 C) (Axillary)   Resp 17   Ht '5\' 7"'  (1.702 m)   Wt 78.6 kg   SpO2 100%   BMI 27.14 kg/m   Physical Exam: Gen: Sleeping comfortably on left side, awakens easily to calling out his name CVS: Pulse regular rhythm, normal rate, S1 and S2 normal Resp: Clear to auscultation bilaterally, no rales/rhonchi Abd: Soft, flat, nontender, bowel sounds normal Ext: Trace left ankle edema, no right lower extremity edema.  Left upper arm AV fistula-pulsatile  Labs: BMET Recent Labs  Lab 04/03/21 0025 04/03/21 0046 04/03/21 0050 04/03/21 0530 04/03/21 1059 04/03/21 1629 04/03/21 2100 04/04/21 0113  04/04/21 0500 04/04/21 0638  NA 142 139   < > 135 140 141 141 137 135 136  K 5.8* 6.0*   < > 5.6* 5.1 5.3* 5.1 3.0* 3.3* 4.2  CL 103 110  --  96* 100 101 102 99 91*  --   CO2 14*  --   --  13* 16* 14* 19* 24 27  --   GLUCOSE 103* 96  --  721* 329* 224* 173* 126* 387*  --   BUN 100* 122*  --  94* 104* 111* 115* 39* 51*  --   CREATININE 20.48* >18.00*  --  18.17* 19.63* 20.24* 20.00* 8.18* 10.60*  --   CALCIUM 9.3  --   --  9.1 9.0 8.8* 8.9 9.0 7.4*  --   PHOS  --   --   --  >30.0* >30.1*  --   --   --   --   --    < > = values in this interval not displayed.   CBC Recent Labs  Lab 04/03/21 0025 04/03/21 0046 04/03/21 0340 04/03/21 0530 04/04/21 0500 04/04/21 0638  WBC 1.5*  --   --  0.7* 3.2*  --   NEUTROABS 0.7*  --   --   --  2.5  --   HGB 11.6*   < > 11.6* 11.4* 11.1* 11.9*  HCT 39.0   < > 34.0* 37.0* 33.3* 35.0*  MCV 114.7*  --   --  108.2* 101.5*  --   PLT 62*  --   --  57* 75*  --    < > = values in this interval not displayed.  Medications:     budesonide (PULMICORT) nebulizer solution  0.25 mg Nebulization BID   chlorhexidine gluconate (MEDLINE KIT)  15 mL Mouth Rinse BID   Chlorhexidine Gluconate Cloth  6 each Topical [id number]   folic acid  1 mg Oral Daily   heparin injection (subcutaneous)  5,000 Units Subcutaneous Q8H   insulin aspart  0-6 Units Subcutaneous Q4H   ipratropium-albuterol  3 mL Nebulization BID   pantoprazole (PROTONIX) IV  40 mg Intravenous QHS   thiamine  100 mg Oral Daily   Elmarie Shiley, MD 04/05/2021, 8:10 AM

## 2021-04-05 NOTE — Progress Notes (Signed)
Inpatient Rehab Admissions Coordinator:   Per therapy recommendations, patient was screened for CIR candidacy by Clemens Catholic, MS, CCC-SLP. At this time, only PT has seen Pt. OT  orders placed today. I will await OT recommendations before screening for candidacy.  Clemens Catholic, [place], [place] Admissions Coordinator  [phone] ([place]) [phone] (office)

## 2021-04-05 NOTE — Progress Notes (Signed)
NAME:  RIYAAN HEROUX MRN:  [id number] DOB:  24-Aug-1955 LOS: 2 ADMISSION DATE:  04/03/2021 DATE OF SERVICE:  04/03/2021 CHIEF COMPLAINT:  Cardiac arrest in the field   History of Present Illness:  65 year old man who presented to [facility] ED via EMS 9/24 with altered mental status.  PMHx significant for HTN, CAD, PAD, COPD, ESRD (on HD TTS via LUE AVF), Hep C (s/p treatment with SVR), T2DM and tobacco abuse.  A well-check request was called in by a family member. Per EMS report, patient was found unresponsive and hypoglycemic (CBG 22). He subsequently experienced PEA arrest in the field. CPR was initiated with ROSC achieved after 20 minutes of CPR/ACLS efforts per verbal report (no further details at this time). Patient was also noted to be hypothermic (rectal probe unable to register temp).  In the ER, the patient had [facility] airway exchanged to endotracheal tube (with RSI medications) and Bair Hugger was initiated. Monitor demonstrated junctional bradycardia.    PCCM consulted for ICU admission post-arrest.  Pertinent Medical History:   Past Medical History:  Diagnosis Date   Anemia    CAD (coronary artery disease)    a. 03/2018: cath showing 63 to 30% stenosis along the LAD, luminal irregularities along the RCA, and angiographically normal LCx   COPD (chronic obstructive pulmonary disease) (HCC)    ESRD (end stage renal disease) on dialysis ([place])    "MWF; [facility]" (07/22/17)   Hepatitis C    Still positive s/p liver biopsy at [facility]  and interferon therapy for 6 months. Most recent lab work was on 10/24/12   Hepatitis C    "took the tx; gone now" (12/05/2016)  Was treated   History of blood transfusion ~ 2012/2013   "related to my kidneys; blood was low"   Hypertension    Peripheral arterial disease ([place])    Substance abuse ([place])    Thyroid disease    Type 2 diabetes mellitus with other diabetic kidney complication ([place]) 3/81/8299   Significant Hospital Events:  9/24 - BIB EMS for AMS  post-arrest. PEA arrest in the field with 8min resuscitation. Hypothermic and hypoglycemic to 22 in the field. [name] exchanged to ETT in ED. LTM EEG to r/o seizure. 9/25 - Extubated to [place]. Echo with LVEF 50-55%, global LV hypokinesis, severe LVH. Cardiology consulted for trop elevation, ?NSTEMI 9/26 - Decreased O2 requirements (RA), remains altered. Repeat CT Head (per Neuro), LLE swelling noted, LE doppler negative for DVT.  Interim History/Subjective:  No significant events overnight Remains somewhat altered this AM, oriented to self/intermittently to location Following commands Repeat CT Head per Neuro Passed bedside swallow, tolerating sips + PO meds LLE swelling noted, LE dopplers negative for DVT HD TTS (back to home schedule)  Objective:  BP (!) 141/109 (BP Location: Left Leg)   Pulse 82   Temp 97.8 F (36.6 C) (Axillary)   Resp 15   Ht 5\' 7"  (1.702 m)   Wt 78.6 kg   SpO2 100%   BMI 27.14 kg/m     Filed Weights   04/03/21 0255 04/04/21 0400  Weight: 83.8 kg 78.6 kg    Intake/Output Summary (Last 24 hours) at 04/05/2021 0840 Last data filed at 04/05/2021 0800 Gross per 24 hour  Intake 1309.29 ml  Output --  Net 1309.29 ml    Physical Examination: General: Acutely ill-appearing middle-aged man in NAD. HEENT: [place]/AT, anicteric sclera, PERRL, moist mucous membranes. Neuro: Awake, oriented x 1-2. Responds to verbal stimuli. Following commands intermittently.  Moves all 4 extremities spontaneously.  CV: RRR, no m/g/r. PULM: Breathing even and unlabored on RA. Lung fields CTAB anteriorly. GI: Soft, nontender, nondistended. Normoactive bowel sounds. Extremities: LLE edema noted with increased varicosities. LUE AVF with +thrill. Skin: Warm/dry, no rashes.  Resolved Hospital Problem List:    Assessment & Plan:   Acute encephalopathy Myoclonus Seems to be more attributable to TME than to significant ABI. - Neuro following, appreciate recommendations - F/u repeat CT  Head - Frequent neuro exams - Neuroprotective measures: HOB > 30 degrees, normoglycemia, normothermia, electrolytes WNL  Post-PEA arrest Following commands. ?hypoglycemia, hypothermia as cause. 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 - Supplemental O2 PRN for sat > 90% - Pulmonary hygiene - Continue bronchodilators  ESRD on HD (TTS) AGMA HD via LUE AVF. - Nephrology following, appreciate recommendations - HD, next 9/27 to realign with outpatient HD schedule (TTS) - Trend BMP - Replete electrolytes as indicated - Monitor I&Os - Avoid nephrotoxic agents as able - Ensure adequate renal perfusion  Possible NSTEMI ST depressions in precordial leads which are new which may reflect LVH assd repolarization vs NSTEMI.  - Cardiology consulted, appreciate recs - Troponins downtrending - TTE as above  Hypoglycemia Hypoglycemic in the field with CBG 22. Continues to be hypoglycemic to 50s, 60s while inpatient. - Continue D10 - CBGs Q4H - Liberalized to clear liquid diet today, but needing significant prompting to drink/eat - Nutrition consult for assessment, possible TF need - F/u C-peptide, proinsulin  Hypothermia ?Caused by hypoglycemia, uncommon to see profound hypothermia from hypoglycemia alone. TSH, fT4, cortisol ok. - May discontinue antibiotics at [facility] if culture data remains negative  Mildly reduced EF HTN - Holding home antihypertensives, resume as clinically appropriate  Pancytopenia History of hepatitis C Previously attributed to chronic liver disease. - Continue to monitor  Best Practice: (right click and "Reselect all SmartList Selections" daily)   Diet/type: tubefeeds and clear liquids DVT prophylaxis: prophylactic heparin  GI prophylaxis: PPI Central venous access:  N/A Foley:  N/A Code Status:  full code Last date  of multidisciplinary goals of care discussion: 9/25  Critical Care Time: 32 minutes   Lestine Mount, PA-C [facility] 04/05/21 8:42 AM  Please see [email or web address] for pager details.  From 7A-7P if no response, please call [phone] After hours, please call ELink [phone]

## 2021-04-05 NOTE — Progress Notes (Signed)
Nutrition Follow-up  DOCUMENTATION CODES:   Not applicable  INTERVENTION:   Boost Breeze po TID, each supplement provides 250 kcal and 9 grams of protein. Renal MVI daily.  NUTRITION DIAGNOSIS:   Increased nutrient needs related to chronic illness (ESRD on HD) as evidenced by estimated needs.  Ongoing, now on clear liquids  GOAL:   Patient will meet greater than or equal to 90% of their needs  Progressing  MONITOR:   PO intake, Supplement acceptance, Diet advancement, Labs  REASON FOR ASSESSMENT:   Consult Enteral/tube feeding initiation and management  ASSESSMENT:   This 65 y.o. African American male smoker presented to the [facility] Emergency Department via EMS with complaints of altered mental status.  A well-check request was called in by a family member, and EMS apparently found the patient to be unresponsive and hypoglycemic (22).  He subsequently experienced cardiac arrest in the field.  CPR was initiated with ROSC achieved after 20 min of CPR/ACLS efforts per verbal report (no further details at this time).  The patient was also noted to be hypothermic (rectal probe unable to register temp).  In the ER, the patient had [facility] airway exchanged to endotracheal tube (with RSI medications).  He was started on [facility].  Monitor showed junctional bradycardia.  PCCM asked to admit.  Patient was extubated 9/25. Diet has been advanced to clear liquids. Patient is requiring a lot of encouragement and assistance with meals. He remains encephalopathic. May require Cortrak placement for enteral nutrition support. Holding off today to see if he will eat.   Last HD 9/24 (2.8 L UF), next HD planned for 9/27.  Patient lying on his side in bed during RD visit. He woke up, but was unable to provide reliable nutrition history.   Labs reviewed. Phos 8.6 CBG: 61-75-100  Medications reviewed and include folic acid, novolog SSI, Protonix, thiamine. IVF: D10 at 50  ml/h  Current weight 78.6 kg (9/25) Admit weight 83.8 kg (9/24)  I/O +1.8 L since admission UOP: anuric  NUTRITION - FOCUSED PHYSICAL EXAM:  Flowsheet Row Most Recent Value  Orbital Region No depletion  Upper Arm Region No depletion  Thoracic and Lumbar Region Unable to assess  Buccal Region Mild depletion  Temple Region Mild depletion  Clavicle Bone Region Unable to assess  Clavicle and Acromion Bone Region Unable to assess  Scapular Bone Region No depletion  Dorsal Hand No depletion  Patellar Region No depletion  Anterior Thigh Region No depletion  Posterior Calf Region No depletion  Edema (RD Assessment) Mild  Hair Reviewed  Eyes Reviewed  Mouth Reviewed  Skin Reviewed  Nails Reviewed       Diet Order:   Diet Order             Diet clear liquid Room service appropriate? Yes; Fluid consistency: Thin  Diet effective now                   EDUCATION NEEDS:   Not appropriate for education at this time  Skin:  Skin Assessment: Reviewed RN Assessment  Last BM:  9/25 type 7  Height:   Ht Readings from Last 1 Encounters:  04/03/21 5\' 7"  (1.702 m)    Weight:   Wt Readings from Last 1 Encounters:  04/04/21 78.6 kg    Ideal Body Weight:  67.3 kg  BMI:  Body mass index is 27.14 kg/m.  Estimated Nutritional Needs:   Kcal:  2831-5176  Protein:  110-125  grams  Fluid:  1 L + UOP    Lucas Mallow, RD, LDN, CNSC Please refer to Amion for contact information.

## 2021-04-05 NOTE — Progress Notes (Signed)
Hypoglycemic Event  CBG: 61  Treatment: 8 oz juice/soda  Symptoms: None  Follow-up CBG: Time:1200 CBG Result:75  Possible Reasons for Event: Inadequate meal intake  Comments/MD notified:Continue to encourage PO intake    Raymond [name] A

## 2021-04-05 NOTE — Progress Notes (Signed)
Subjective: No further twitching per nursing.   Exam: Vitals:   04/05/21 0400 04/05/21 0500  BP: 134/61 (!) 111/35  Pulse: 86 83  Resp: (!) 21 16  Temp:    SpO2: 99% 99%   Gen: In bed, NAD Resp: non-labored breathing, no acute distress Abd: soft, nt  Neuro: MS: awake, gives month as July, does not answer some questions. Follows commands to stick out tongue and wiggle toes before stopping cooperating [id number] reactive bilaterally, wont keep eyes open to compare size. Eomi, VFF Motor: moves all extremities to command, but won't cooperate with formal testing.  Sensory:intact to LT  Pertinent Labs: BUN 51 yesterday TSH, T4 from 9/24 were normal.  B12 2869  Impression: 65 yo M with a h/o ESRD, DM2, thyroid disease who was found down with hypoglycemia and hypothermia being evaluated by neruology for persistent AMS and multifocal myoclonus. LTM EEG with no suggestion that myoclonus is epileptic in nature.   At this time, I suspect he has a multifactorial encepahlopathy with gradually improving mentation. Though with CPR and hypoglycemia, some degree of anoxic brain injury or hypoglycemic brain injury is possible, care would be supportive in any case. I am not sure he would cooperate for MRI, so will repeat head CT. If this is negative, then care would simply be supportive and I would expect slow gradual recovery.   Recommendations: 1) Repeat head CT.  2) If negaitve, would continue supportive care and neurology will be availabel as needed.   Roland Rack, MD [facility] [phone]  If 7pm- 7am, please page neurology on call as listed in [place].

## 2021-04-06 LAB — BASIC METABOLIC PANEL
Anion gap: 15 (ref 5–15)
BUN: 75 mg/dL — ABNORMAL HIGH (ref 8–23)
CO2: 22 mmol/L (ref 22–32)
Calcium: 8.4 mg/dL — ABNORMAL LOW (ref 8.9–10.3)
Chloride: 89 mmol/L — ABNORMAL LOW (ref 98–111)
Creatinine, Ser: 12.53 mg/dL — ABNORMAL HIGH (ref 0.61–1.24)
GFR, Estimated: 4 mL/min — ABNORMAL LOW (ref >60)
Glucose, Bld: 102 mg/dL — ABNORMAL HIGH (ref 70–99)
Potassium: 4.9 mmol/L (ref 3.5–5.1)
Sodium: 126 mmol/L — ABNORMAL LOW (ref 135–145)

## 2021-04-06 LAB — CBC
HCT: 28.5 % — ABNORMAL LOW (ref 39.0–52.0)
Hemoglobin: 9.3 g/dL — ABNORMAL LOW (ref 13.0–17.0)
MCH: 32.9 pg (ref 26.0–34.0)
MCHC: 32.6 g/dL (ref 30.0–36.0)
MCV: 100.7 fL — ABNORMAL HIGH (ref 80.0–100.0)
Platelets: 75 10*3/uL — ABNORMAL LOW (ref 150–400)
RBC: 2.83 MIL/uL — ABNORMAL LOW (ref 4.22–5.81)
RDW: 17.4 % — ABNORMAL HIGH (ref 11.5–15.5)
WBC: 4.5 10*3/uL (ref 4.0–10.5)
nRBC: 0 % (ref 0.0–0.2)

## 2021-04-06 LAB — HEPATITIS B SURFACE ANTIBODY, QUANTITATIVE: Hep B S AB Quant (Post): >1000 m[IU]/mL (ref >9.9)

## 2021-04-06 LAB — GLUCOSE, CAPILLARY
Glucose-Capillary: 84 mg/dL (ref 70–99)
Glucose-Capillary: 85 mg/dL (ref 70–99)
Glucose-Capillary: 88 mg/dL (ref 70–99)
Glucose-Capillary: 89 mg/dL (ref 70–99)
Glucose-Capillary: 106 mg/dL — ABNORMAL HIGH (ref 70–99)
Glucose-Capillary: 122 mg/dL — ABNORMAL HIGH (ref 70–99)
Glucose-Capillary: 201 mg/dL — ABNORMAL HIGH (ref 70–99)
Glucose-Capillary: 159 mg/dL — ABNORMAL HIGH (ref 70–99)
Glucose-Capillary: 162 mg/dL — ABNORMAL HIGH (ref 70–99)

## 2021-04-06 LAB — CALCIUM, IONIZED: Calcium, Ionized, Serum: 4.3 mg/dL — ABNORMAL LOW (ref 4.5–5.6)

## 2021-04-06 LAB — MAGNESIUM: Magnesium: 2.1 mg/dL (ref 1.7–2.4)

## 2021-04-06 LAB — PATHOLOGIST SMEAR REVIEW

## 2021-04-06 LAB — C-PEPTIDE: C-Peptide: 15.1 ng/mL — ABNORMAL HIGH (ref 1.1–4.4)

## 2021-04-06 MED ORDER — HEPARIN SODIUM (PORCINE) 1000 UNIT/ML DIALYSIS: 40.0000 [IU]/kg | INTRAMUSCULAR | Status: DC | PRN

## 2021-04-06 MED ORDER — OCTREOTIDE ACETATE 100 MCG/ML IJ SOLN
100.0000 ug | Freq: Two times a day (BID) | INTRAMUSCULAR | Status: DC
  Administered 2021-04-06: 100 ug via SUBCUTANEOUS
  Filled 2021-04-06: qty 1
  Filled 2021-04-06: qty 2
  Filled 2021-04-06: qty 1

## 2021-04-06 MED ORDER — INSULIN ASPART 100 UNIT/ML IJ SOLN: 0.0000 [IU] | Freq: Three times a day (TID) | INTRAMUSCULAR | Status: DC

## 2021-04-06 NOTE — Progress Notes (Signed)
inpatient Rehab Admissions Coordinator:   Per PT recommendations, patient was screened for CIR candidacy by Clemens Catholic, MS, CCC-SLP. At this time, OT is recommending SNF and I am unable to get insurance approval unless both PT and OT are recommending CIR.. Pt.'s insurance is also unlikely to approve CIR for his diagnoses. I will not pursue CIR consult at this time.  Please contact me any with questions.  Clemens Catholic, [place], [place] Admissions Coordinator  [phone] ([place]) [phone] (office)

## 2021-04-06 NOTE — Progress Notes (Signed)
NAME:  Raymond [name] MRN:  [id number] DOB:  July 20, 1955 LOS: 3 ADMISSION DATE:  04/03/2021 DATE OF SERVICE:  04/03/2021 CHIEF COMPLAINT:  Cardiac arrest in the field   History of Present Illness:  65 year old man who presented to [facility] ED via EMS 9/24 with altered mental status.  PMHx significant for HTN, CAD, PAD, COPD, ESRD (on HD TTS via LUE AVF), Hep C (s/p treatment with SVR), T2DM and tobacco abuse.  A well-check request was called in by a family member. Per EMS report, patient was found unresponsive and hypoglycemic (CBG 22). He subsequently experienced PEA arrest in the field. CPR was initiated with ROSC achieved after 20 minutes of CPR/ACLS efforts per verbal report (no further details at this time). Patient was also noted to be hypothermic (rectal probe unable to register temp).  In the ER, the patient had [name] airway exchanged to endotracheal tube (with RSI medications) and Bair Hugger was initiated. Monitor demonstrated junctional bradycardia.    PCCM consulted for ICU admission post-arrest.  Pertinent Medical History:   Past Medical History:  Diagnosis Date   Anemia    CAD (coronary artery disease)    a. 03/2018: cath showing 72 to 30% stenosis along the LAD, luminal irregularities along the RCA, and angiographically normal LCx   COPD (chronic obstructive pulmonary disease) (HCC)    ESRD (end stage renal disease) on dialysis ([place])    "MWF; [facility]" (07/22/17)   Hepatitis C    Still positive s/p liver biopsy at [facility]  and interferon therapy for 6 months. Most recent lab work was on 10/24/12   Hepatitis C    "took the tx; gone now" (12/05/2016)  Was treated   History of blood transfusion ~ 2012/2013   "related to my kidneys; blood was low"   Hypertension    Peripheral arterial disease ([place])    Substance abuse ([place])    Thyroid disease    Type 2 diabetes mellitus with other diabetic kidney complication ([place]) 0/93/2671   Significant Hospital Events:  9/24 - BIB EMS for AMS  post-arrest. PEA arrest in the field with 50min resuscitation. Hypothermic and hypoglycemic to 22 in the field. [name] exchanged to ETT in ED. LTM EEG to r/o seizure. 9/25 - Extubated to [name]. Echo with LVEF 50-55%, global LV hypokinesis, severe LVH. Cardiology consulted for trop elevation, ?NSTEMI 9/26 - Decreased O2 requirements (RA), remains altered. Repeat CT Head (per Neuro), LLE swelling noted, LE doppler negative for DVT. 9/27 - Improving mental status, more awake/less lethargic. Remains mildly hypoglycemic. C-peptide elevated, octreotide initiated. CT Head NAICA. HD today per Nephro. Staples x 2 removed from L groin.  Interim History/Subjective:  No significant events overnight Improving mental status, more alert today Oriented to self/location and ?month, overall appropriate CT Head NAICA Diet advanced to regular to encourage appetite HD today per Nephro Octreotide started, given persistent hypoglycemia/elevated C-peptide  Objective:  BP (!) 109/92   Pulse 78   Temp 98.2 F (36.8 C) (Oral)   Resp 19   Ht 5\' 7"  (1.702 m)   Wt 79 kg   SpO2 99%   BMI 27.28 kg/m     Filed Weights   04/03/21 0255 04/04/21 0400 04/06/21 0500  Weight: 83.8 kg 78.6 kg 79 kg    Intake/Output Summary (Last 24 hours) at 04/06/2021 0853 Last data filed at 04/06/2021 0700 Gross per 24 hour  Intake 2101.45 ml  Output --  Net 2101.45 ml    Physical Examination: General: Acutely ill-appearing middle-aged  man in NAD. HEENT: [place]/AT, anicteric sclera, PERRL, moist mucous membranes. Neuro: Awake, oriented x 1-2. Responds to verbal stimuli. Following commands consistently. Moves all 4 extremities spontaneously.  CV: RRR, no m/g/r. PULM: Breathing even and unlabored on RA. Lung fields CTAB in upper fields, diminished at bilateral bases. GI: Soft, nontender, nondistended. Normoactive bowel sounds. Extremities: LLE edema noted, generalized. LUE AVF with +thrill. Skin: Warm/dry, no rashes.  Resolved  Hospital Problem List:  Hypothermia  Assessment & Plan:   Acute toxic metabolic encephalopathy Myoclonus Seems to be more attributable to TME than to significant ABI. Repeat CT Head without acute intracranial abnormalities or evidence of anoxic brain injury. - Neuro following, appreciate recommendations - Frequent neuro exams - Neuroprotective measures: HOB > 30 degrees, normoglycemia, normothermia, electrolytes WNL  Post-PEA arrest Following commands. ?hypoglycemia, hypothermia as cause. TTE with LVEF 50-55%, LV global hypokinesis, severe LVH. 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 - Supplemental O2 PRN for sat > 90% - Pulmonary hygiene - Continue bronchodilators  ESRD on HD (TTS) AGMA HD via LUE AVF. - Nephrology following, appreciate recommendations - HD today 9/27, resume TTS schedule (consistent with outpatient) - Trend BMP - Replete electrolytes as indicated - Monitor I&Os - Avoid nephrotoxic agents as able - Ensure adequate renal perfusion  Possible NSTEMI ST depressions in precordial leads which are new which may reflect LVH assd repolarization vs NSTEMI.  - Cardiology consulted, appreciate assistance - Troponins down - TTE as above - Continue cardiac monitoring  Hypoglycemia Hypoglycemic in the field with CBG 22. Continues to be hypoglycemic to 50s, 60s while inpatient. Suspect multifactorial; arrest vs. Decreased PO intake, cannot r/o tumor such as insulinoma if failing to improve, workup pending. - Continue D10, hopeful to discontinue this 9/27 vs. 9/28 if PO intake/hypoglycemia improved. - CBGs Q4H - Begin octreotide 100mg  [place] BID - C-peptide elevated to 15 - F/u insulin/proinsulin - Nutrition following, appreciate assistance  Mildly reduced EF HTN - Holding home antihypertensives, resume as clinically appropriate  Pancytopenia History of  hepatitis C (treated, with SVR) Previously attributed to chronic liver disease. - Continue to monitor  Physical deconditioning At risk for malnutrition - PT/OT consults and SLP evaluation appreciated - Nutrition consulted, following - CIR evaluation once appropriate from a clinical standpoint  Best Practice: (right click and "Reselect all SmartList Selections" daily)   Diet/type: tubefeeds and clear liquids DVT prophylaxis: prophylactic heparin  GI prophylaxis: PPI Central venous access:  N/A Foley:  N/A Code Status:  full code Last date of multidisciplinary goals of care discussion: 9/25  Critical Care Time: 34 minutes   Lestine Mount, PA-C [facility] 04/06/21 8:53 AM  Please see [email or web address] for pager details.  From 7A-7P if no response, please call [phone] After hours, please call ELink [phone]

## 2021-04-06 NOTE — Progress Notes (Signed)
eLink Physician-Brief Progress Note Patient Name: Raymond [name] DOB: [birth year] MRN: [id number]   Date of Service  04/06/2021  HPI/Events of Note  CBG of 84 with orders to notify if < 90, has had problems since admission with hypoglycemia, bedside RN was able to get patient to take in some juice but not taking in large amount,  currently D10 is at 50cc/hr.   RN will be rechecking CBG soon but was wondering if D10 needed to be increased slightly.  eICU Interventions  Increased D10 to 75 cc/hr     Intervention Category Intermediate Interventions: Other:  [name] 04/06/2021, 12:22 AM

## 2021-04-06 NOTE — Progress Notes (Signed)
Patient ID: CID AGENA, male   DOB: [birth year], 65 y.o.   MRN: [id number] [facility] Progress Note   Assessment/ Plan:   1.  Acute encephalopathy: Status postcardiac arrest with likely element of hypoxic/anoxic brain injury possibly compounded by hypoglycemia.  Intermittently confused but more awake overall.  2. ESRD: On schedule for hemodialysis today to continue his TTS schedule; he does not have any evidence of significant volume overload. 3. Anemia: Hemoglobin and hematocrit at acceptable range and without indications for ESA at this time.  No overt blood loss noted. 4. CKD-MBD: Significantly elevated phosphorus levels noted-likely with poor adherence to outpatient binders/diet.  Will resume binders. 5. Nutrition: Currently on D10W for hypoglycemia management- will need SLP eval for bedside swallow and additional recommendations. 6. Hypertension: Blood pressure currently at goal, continue to follow.  Subjective:   Improving mentation/interaction and complains of bilateral LE pain.    Objective:   BP (!) 109/92   Pulse 78   Temp 98.2 F (36.8 C) (Oral)   Resp 19   Ht _0  (1.702 m)   Wt 79 kg   SpO2 99%   BMI 27.28 kg/m   Physical Exam: Gen: Uncomfortable sitting up in bed (from LE pain)- working with PT CVS: Pulse regular rhythm, normal rate, S1 and S2 normal Resp: Clear to auscultation bilaterally, no rales/rhonchi Abd: Soft, flat, nontender, bowel sounds normal Ext: Trace left ankle edema, no right lower extremity edema.  Left upper arm AV fistula-pulsatile  Labs: BMET Recent Labs  Lab 04/03/21 0530 04/03/21 1059 04/03/21 1629 04/03/21 2100 04/04/21 0113 04/04/21 0500 04/04/21 0638 04/05/21 1134  NA 135 140 141 141 137 135 136 135  K 5.6* 5.1 5.3* 5.1 3.0* 3.3* 4.2 5.1  CL 96* 100 101 102 99 91*  --  94*  CO2 13* 16* 14* 19* 24 27  --  24  GLUCOSE 721* 329* 224* 173* 126* 387*  --  92  BUN 94* 104* 111* 115* 39* 51*  --  69*  CREATININE  18.17* 19.63* 20.24* 20.00* 8.18* 10.60*  --  12.35*  CALCIUM 9.1 9.0 8.8* 8.9 9.0 7.4*  --  8.4*  PHOS >30.0* >30.1*  --   --   --   --   --  8.6*   CBC Recent Labs  Lab 04/03/21 0025 04/03/21 0046 04/03/21 0530 04/04/21 0500 04/04/21 0638 04/05/21 1134  WBC 1.5*  --  0.7* 3.2*  --  5.0  NEUTROABS 0.7*  --   --  2.5  --   --   HGB 11.6*   < > 11.4* 11.1* 11.9* 9.7*  HCT 39.0   < > 37.0* 33.3* 35.0* 30.3*  MCV 114.7*  --  108.2* 101.5*  --  102.0*  PLT 62*  --  57* 75*  --  69*   < > = values in this interval not displayed.     Medications:     budesonide (PULMICORT) nebulizer solution  0.25 mg Nebulization BID   chlorhexidine gluconate (MEDLINE KIT)  15 mL Mouth Rinse BID   Chlorhexidine Gluconate Cloth  6 each Topical Q0600   feeding supplement  1 Container Oral TID BM   folic acid  1 mg Oral Daily   heparin injection (subcutaneous)  5,000 Units Subcutaneous Q8H   insulin aspart  0-6 Units Subcutaneous TID AC & HS   ipratropium-albuterol  3 mL Nebulization BID   multivitamin  1 tablet Oral QHS   pantoprazole  40 mg Oral  QHS   thiamine  100 mg Oral Daily   Elmarie Shiley, MD 04/06/2021, 7:38 AM

## 2021-04-06 NOTE — Progress Notes (Signed)
Hemodialysis- Tolerated well without issue. 2L removed as ordered. Report called to ICU. Patient currently alert to self and resting without complaints.

## 2021-04-06 NOTE — Progress Notes (Signed)
eLink Physician-Brief Progress Note Patient Name: Raymond [name] DOB: 1955/09/04 MRN: [id number]   Date of Service  04/06/2021  HPI/Events of Note  Received request for AM labs.   eICU Interventions  CBC and BMP ordered.     Intervention Category Minor Interventions: Other:  Elsie Lincoln 04/06/2021, 8:15 PM

## 2021-04-06 NOTE — Evaluation (Signed)
Occupational Therapy Evaluation Patient Details Name: Raymond [name] MRN: [id number] DOB: 26-May-1956 Today's Date: 04/06/2021   History of Present Illness 65 y.o. male admitted 9/24 with initial EMS call for AMS and hypoglycemia with PEA arrest in field and CPR with ROSC after 20 min. Intubated 9/24-9/25. PMhx; COPD, ESRD on iHD, hep C, PAD, HTN, substance abuse, thyroid disease, DM2, mild CAD   Clinical Impression   Patient with decreased cognition and inability to provide home set-up or PLOF. No family present at bedside. Information obtained from chart review. PTA patient was living alone and was grossly I with ADLs/IADLs . Per med [employer], used RW for community mobility. Patient currently functioning below baseline requiring Mod to Max A grossly with ADLs. Patient also limited by deficits listed below including decreased cognition scoring >26/28 on SBT, decreased balance and generalized weakness and would benefit from continued acute OT services in prep for safe d/c to next level of care.       Recommendations for follow up therapy are one component of a multi-disciplinary discharge planning process, led by the attending physician.  Recommendations may be updated based on patient status, additional functional criteria and insurance authorization.   Follow Up Recommendations  SNF;Supervision/Assistance - 24 hour    Equipment Recommendations  3 in 1 bedside commode    Recommendations for Other Services       Precautions / Restrictions Precautions Precautions: Fall Precaution Comments: BLE pain (MD reports patient with Hx of PVD) Restrictions Weight Bearing Restrictions: No      Mobility Bed Mobility Overal bed mobility: Needs Assistance Bed Mobility: Supine to Sit     Supine to sit: Min assist;HOB elevated     General bed mobility comments: HOB slightly elevated and max multimodal cues for sequencing.    Transfers Overall transfer level: Needs assistance   Transfers: Squat  Pivot Transfers     Squat pivot transfers: Min assist;Mod assist     General transfer comment: Min A to recliner on R with max cues for hand/foot placement and for sequencing.    Balance Overall balance assessment: Needs assistance Sitting-balance support: Feet supported;No upper extremity supported Sitting balance-Leahy Scale: Fair Sitting balance - Comments: Standby assist to maintain static sitting balance at EOB.   Standing balance support: Bilateral upper extremity supported Standing balance-Leahy Scale: Poor Standing balance comment: Reliant on external assist.                           ADL either performed or assessed with clinical judgement   ADL Overall ADL's : Needs assistance/impaired Eating/Feeding: Minimal assistance;Sitting Eating/Feeding Details (indicate cue type and reason): Patient with tremor in RUE. Unable to report if this is baseline or new since admission. Some difficulty noted with managing broth using spoon. Grooming: Moderate assistance   Upper Body Bathing: Moderate assistance;Sitting   Lower Body Bathing: Maximal assistance;Bed level   Upper Body Dressing : Moderate assistance;Sitting   Lower Body Dressing: Maximal assistance;Bed level   Toilet Transfer: Minimal assistance Toilet Transfer Details (indicate cue type and reason): Simulated with squat-pivot to recliner on R with max multimodal cues for hand placement.                 Vision   Additional Comments: Difficult to assess 2/2 cognition.     Perception     Praxis      Pertinent Vitals/Pain Pain Assessment: Faces Faces Pain Scale: Hurts whole lot Pain Location: BLE with movement  and weight bearing. Pain Descriptors / Indicators: Aching;Grimacing;Constant;Guarding Pain Intervention(s): Limited activity within patient's tolerance;Monitored during session;Repositioned     Hand Dominance Right   Extremity/Trunk Assessment Upper Extremity Assessment Upper  Extremity Assessment: Generalized weakness   Lower Extremity Assessment Lower Extremity Assessment: Generalized weakness   Cervical / Trunk Assessment Cervical / Trunk Assessment: Kyphotic   Communication Communication Communication: No difficulties   Cognition Arousal/Alertness: Awake/alert Behavior During Therapy: Flat affect Overall Cognitive Status: Impaired/Different from baseline Area of Impairment: Orientation;Attention;Memory;Following commands;Safety/judgement                 Orientation Level: Disoriented to;Time;Situation;Place Current Attention Level: Focused Memory: Decreased short-term memory Following Commands: Follows one step commands inconsistently;Follows multi-step commands with increased time Safety/Judgement: Decreased awareness of safety;Decreased awareness of deficits     General Comments: Patient disoriented to time, place and situation. Requires max multimodal cues to sequence familiar ADL tasks inculding face washing. Poor attention and initiation.   General Comments  VSS on RA.    Exercises     Shoulder Instructions      Home Living Family/patient expects to be discharged to:: Private residence Living Arrangements: Alone Available Help at Discharge: Family;Friend(s);Available PRN/intermittently Type of Home: Apartment Home Access: Elevator     Home Layout: One level     Bathroom Shower/Tub: Chief Strategy Officer: Standard Bathroom Accessibility: Yes   Home Equipment: Grab bars - toilet;Grab bars - tub/shower;Walker - 2 wheels;Cane - single point   Additional Comments: Patient with AMS. Unable to provide PLOF or home set-up. No family present at bedside.      Prior Functioning/Environment Level of Independence: Independent with assistive device(s)        Comments: Independent with ADLs and iADLs. Uses RW for community ambulation.        OT Problem List: Decreased strength;Decreased activity tolerance;Impaired  balance (sitting and/or standing);Decreased coordination;Decreased cognition;Decreased safety awareness;Decreased knowledge of use of DME or AE;Pain;Increased edema      OT Treatment/Interventions: Self-care/ADL training;Therapeutic exercise;Energy conservation;DME and/or AE instruction;Therapeutic activities;Patient/family education;Balance training    OT Goals(Current goals can be found in the care plan section) Acute Rehab OT Goals Patient Stated Goal: No goals stated OT Goal Formulation: Patient unable to participate in goal setting Time For Goal Achievement: 04/20/21 Potential to Achieve Goals: Good ADL Goals Pt Will Perform Eating: Independently;sitting Pt Will Perform Grooming: with modified independence;standing Pt Will Perform Upper Body Dressing: Independently Pt Will Perform Lower Body Dressing: with modified independence;sit to/from stand Pt Will Transfer to Toilet: with modified independence;ambulating Pt Will Perform Toileting - Clothing Manipulation and hygiene: with modified independence;sit to/from stand Pt Will Perform Tub/Shower Transfer: Tub transfer;3 in 1;rolling walker Additional ADL Goal #1: Patient will score <4/28 on SBT indicating improved cognition in prep for ADLs/IADLs.  OT Frequency: Min 2X/week   Barriers to D/C: Decreased caregiver support  Lives alone       Co-evaluation              AM-PAC OT "6 Clicks" Daily Activity     Outcome Measure Help from another person eating meals?: A Little Help from another person taking care of personal grooming?: A Lot Help from another person toileting, which includes using toliet, bedpan, or urinal?: A Lot Help from another person bathing (including washing, rinsing, drying)?: A Lot Help from another person to put on and taking off regular upper body clothing?: A Lot Help from another person to put on and taking off regular lower body clothing?: A  Lot 6 Click Score: 13   End of Session Equipment Utilized  During Treatment: Gait belt Nurse Communication: Mobility status;Other (comment) (Response to treatment)  Activity Tolerance: Patient limited by pain;Patient limited by lethargy Patient left: in chair;with call bell/phone within reach;with chair alarm set  OT Visit Diagnosis: Unsteadiness on feet (R26.81);Muscle weakness (generalized) (M62.81);Other symptoms and signs involving cognitive function;Pain Pain - Right/Left:  (Bilateral) Pain - part of body: Leg;Ankle and joints of foot;Knee                Time: 0725-0748 OT Time Calculation (min): 23 min Charges:  OT General Charges $OT Visit: 1 Visit OT Evaluation $OT Eval Moderate Complexity: 1 Mod OT Treatments $Self Care/Home Management : 8-22 mins  Michaiah Maiden H. OTR/L Supplemental OT, Department of rehab services [phone]  Betina Puckett R H. 04/06/2021, 8:07 AM

## 2021-04-07 LAB — CBC
HCT: 28.1 % — ABNORMAL LOW (ref 39.0–52.0)
Hemoglobin: 9.1 g/dL — ABNORMAL LOW (ref 13.0–17.0)
MCH: 32.9 pg (ref 26.0–34.0)
MCHC: 32.4 g/dL (ref 30.0–36.0)
MCV: 101.4 fL — ABNORMAL HIGH (ref 80.0–100.0)
Platelets: 74 10*3/uL — ABNORMAL LOW (ref 150–400)
RBC: 2.77 MIL/uL — ABNORMAL LOW (ref 4.22–5.81)
RDW: 17.3 % — ABNORMAL HIGH (ref 11.5–15.5)
WBC: 3.5 10*3/uL — ABNORMAL LOW (ref 4.0–10.5)
nRBC: 0 % (ref 0.0–0.2)

## 2021-04-07 LAB — BASIC METABOLIC PANEL
Anion gap: 11 (ref 5–15)
BUN: 34 mg/dL — ABNORMAL HIGH (ref 8–23)
CO2: 25 mmol/L (ref 22–32)
Calcium: 8.4 mg/dL — ABNORMAL LOW (ref 8.9–10.3)
Chloride: 93 mmol/L — ABNORMAL LOW (ref 98–111)
Creatinine, Ser: 7.72 mg/dL — ABNORMAL HIGH (ref 0.61–1.24)
GFR, Estimated: 7 mL/min — ABNORMAL LOW (ref >60)
Glucose, Bld: 112 mg/dL — ABNORMAL HIGH (ref 70–99)
Potassium: 4.4 mmol/L (ref 3.5–5.1)
Sodium: 129 mmol/L — ABNORMAL LOW (ref 135–145)

## 2021-04-07 LAB — GLUCOSE, CAPILLARY
Glucose-Capillary: 147 mg/dL — ABNORMAL HIGH (ref 70–99)
Glucose-Capillary: 77 mg/dL (ref 70–99)
Glucose-Capillary: 78 mg/dL (ref 70–99)
Glucose-Capillary: 82 mg/dL (ref 70–99)
Glucose-Capillary: 94 mg/dL (ref 70–99)
Glucose-Capillary: 97 mg/dL (ref 70–99)

## 2021-04-07 MED ORDER — GLUCOSE 40 % PO GEL
1.0000 | Freq: Four times a day (QID) | ORAL | Status: DC
  Administered 2021-04-08 – 2021-04-15 (×27): 31 g via ORAL
  Filled 2021-04-07 (×28): qty 1

## 2021-04-07 MED ORDER — DEXTROSE 10 % IV SOLN: INTRAVENOUS | Status: DC

## 2021-04-07 MED ORDER — NEPRO/CARBSTEADY PO LIQD
237.0000 mL | Freq: Two times a day (BID) | ORAL | Status: DC
  Administered 2021-04-08 – 2021-04-15 (×12): 237 mL via ORAL

## 2021-04-07 MED ORDER — OXYCODONE HCL 5 MG PO TABS
5.0000 mg | ORAL_TABLET | Freq: Four times a day (QID) | ORAL | Status: DC | PRN
  Administered 2021-04-07 – 2021-04-09 (×6): 5 mg via ORAL
  Filled 2021-04-07 (×6): qty 1

## 2021-04-07 NOTE — Progress Notes (Signed)
Patient ID: Raymond [name], male   DOB: May 04, 1956, 65 y.o.   MRN: [id number] [facility] Progress Note   Assessment/ Plan:   1.  Acute encephalopathy: Status postcardiac arrest with likely element of hypoxic/anoxic brain injury possibly compounded by hypoglycemia. Improving mental status noted over the past 48h.  2. ESRD: Will order for hemodialysis tomorrow to continue his TTS schedule; he does not have any acute indications for dialysis at this time. Will restrict fluids with hyponatremia.  3. Anemia: Hemoglobin and hematocrit at acceptable range and without indications for ESA at this time.  No overt blood loss noted. 4. CKD-MBD: Significantly elevated phosphorus levels noted-likely with poor adherence to outpatient binders/diet.  Will resume binders. 5. Nutrition: Improving oral intake with decreased rate of D10W- anticipate ability to wean this off as oral intake back to normal.   6. Hypertension: Blood pressure currently at goal, continue to follow.  Subjective:   Improving mentation/interaction and complains of bilateral LE pain.    Objective:   BP (!) 128/99   Pulse 91   Temp 97.7 F (36.5 C) (Oral)   Resp 20   Ht _0  (1.702 m)   Wt 77.8 kg   SpO2 97%   BMI 26.86 kg/m   Physical Exam: Gen: Comfortably resting in bed, recognized me and called out my name as I entered his room CVS: Pulse regular rhythm, normal rate, S1 and S2 normal Resp: Clear to auscultation bilaterally, no rales/rhonchi Abd: Soft, flat, nontender, bowel sounds normal Ext: Trace left ankle edema, no right lower extremity edema.  Left upper arm AV fistula-pulsatile  Labs: BMET Recent Labs  Lab 04/03/21 0530 04/03/21 1059 04/03/21 1629 04/03/21 2100 04/04/21 0113 04/04/21 0500 04/04/21 0638 04/05/21 1134 04/06/21 0801 04/07/21 0135  NA 135 140 141 141 137 135 136 135 126* 129*  K 5.6* 5.1 5.3* 5.1 3.0* 3.3* 4.2 5.1 4.9 4.4  CL 96* 100 101 102 99 91*  --  94* 89* 93*  CO2 13* 16*  14* 19* 24 27  --  _1 GLUCOSE 721* 329* 224* 173* 126* 387*  --  92 102* 112*  BUN 94* 104* 111* 115* 39* 51*  --  69* 75* 34*  CREATININE 18.17* 19.63* 20.24* 20.00* 8.18* 10.60*  --  12.35* 12.53* 7.72*  CALCIUM 9.1 9.0 8.8* 8.9 9.0 7.4*  --  8.4* 8.4* 8.4*  PHOS >30.0* >30.1*  --   --   --   --   --  8.6*  --   --    CBC Recent Labs  Lab 04/03/21 0025 04/03/21 0046 04/04/21 0500 04/04/21 0638 04/05/21 1134 04/06/21 0801 04/07/21 0135  WBC 1.5*   < > 3.2*  --  5.0 4.5 3.5*  NEUTROABS 0.7*  --  2.5  --   --   --   --   HGB 11.6*   < > 11.1* 11.9* 9.7* 9.3* 9.1*  HCT 39.0   < > 33.3* 35.0* 30.3* 28.5* 28.1*  MCV 114.7*   < > 101.5*  --  102.0* 100.7* 101.4*  PLT 62*   < > 75*  --  69* 75* 74*   < > = values in this interval not displayed.     Medications:     budesonide (PULMICORT) nebulizer solution  0.25 mg Nebulization BID   chlorhexidine gluconate (MEDLINE KIT)  15 mL Mouth Rinse BID   Chlorhexidine Gluconate Cloth  6 each Topical Q0600   feeding supplement  1 Container Oral TID BM   folic acid  1 mg Oral Daily   heparin injection (subcutaneous)  5,000 Units Subcutaneous Q8H   ipratropium-albuterol  3 mL Nebulization BID   multivitamin  1 tablet Oral QHS   pantoprazole  40 mg Oral QHS   thiamine  100 mg Oral Daily   Elmarie Shiley, MD 04/07/2021, 8:02 AM

## 2021-04-07 NOTE — Progress Notes (Addendum)
Nutrition Follow-up  DOCUMENTATION CODES:   Not applicable  INTERVENTION:   D/C Boost Breeze po TID, changing to more concentrated supplement. Nepro Shake po BID, each supplement provides 420 kcal and 19 grams protein. Continue Renal MVI daily.  NUTRITION DIAGNOSIS:   Increased nutrient needs related to chronic illness (ESRD on HD) as evidenced by estimated needs.  Ongoing  GOAL:   Patient will meet greater than or equal to 90% of their needs  Progressing  MONITOR:   PO intake, Supplement acceptance, Diet advancement, Labs  REASON FOR ASSESSMENT:   Consult Enteral/tube feeding initiation and management  ASSESSMENT:   This 65 y.o. African American male smoker presented to the [facility] Emergency Department via EMS with complaints of altered mental status.  A well-check request was called in by a family member, and EMS apparently found the patient to be unresponsive and hypoglycemic (22).  He subsequently experienced cardiac arrest in the field.  CPR was initiated with ROSC achieved after 20 min of CPR/ACLS efforts per verbal report (no further details at this time).  The patient was also noted to be hypothermic (rectal probe unable to register temp).  In the ER, the patient had [facility] airway exchanged to endotracheal tube (with RSI medications).  He was started on [employer].  Monitor showed junctional bradycardia.  PCCM asked to admit.  Diet advanced to regular on 9/27. Ate 75% of breakfast yesterday.  Discussed patient in ICU rounds and with RN today. Mental status improving. RN reports intake of meals is not optimal, but he does drink the supplements.   1.5 L fluid restriction added to diet order today d/t hyponatremia. Will change to a more concentrated PO supplement to increase protein and calorie provision and decrease volume of intake.   Last HD 9/27 (2 L UF), next HD planned for 9/29.  Labs reviewed. Na 129 CBG: [id number]  Medications  reviewed and include folic acid, Rena-vit, Protonix, thiamine.  Current weight 77.8 kg (9/28) Admit weight 83.8 kg (9/24)  I/O +3.5 L since admission UOP: anuric  Diet Order:   Diet Order             Diet regular Room service appropriate? Yes; Fluid consistency: Thin; Fluid restriction: 1500 mL Fluid  Diet effective now                   EDUCATION NEEDS:   Not appropriate for education at this time  Skin:  Skin Assessment: Reviewed RN Assessment  Last BM:  9/28 type 6  Height:   Ht Readings from Last 1 Encounters:  04/03/21 5\' 7"  (1.702 m)    Weight:   Wt Readings from Last 1 Encounters:  04/07/21 77.8 kg    Ideal Body Weight:  67.3 kg  BMI:  Body mass index is 26.86 kg/m.  Estimated Nutritional Needs:   Kcal:  6256-3893  Protein:  110-125 grams  Fluid:  1 L + UOP    Lucas Mallow, RD, LDN, CNSC Please refer to Amion for contact information.

## 2021-04-07 NOTE — Evaluation (Signed)
Physical Therapy Evaluation Patient Details Name: Raymond [name] MRN: [id number] DOB: [birth year] Today's Date: 04/07/2021  History of Present Illness  65 y.o. male admitted 9/24 with initial EMS call for AMS and hypoglycemia with PEA arrest in field and CPR with ROSC after 20 min. Intubated 9/24-9/25. PMhx; COPD, ESRD on iHD, hep C, PAD, HTN, substance abuse, thyroid disease, DM2, mild CAD   Clinical Impression  Pt remains to have significant L LE pain limiting L LE WBing tolerance and ability to amb. Pt with improved cognition today and focused on LE seated exercises and standing L LE marching to promote activity tolerance. Pt gave good effort today. Continue to recommend CIR upon d/c to maximize functional recovery. Acute PT to cont to follow.       Recommendations for follow up therapy are one component of a multi-disciplinary discharge planning process, led by the attending physician.  Recommendations may be updated based on patient status, additional functional criteria and insurance authorization.  Follow Up Recommendations CIR;Supervision/Assistance - 24 hour    Equipment Recommendations   (TBD)    Recommendations for Other Services Rehab consult     Precautions / Restrictions Precautions Precautions: Fall Precaution Comments: L LE pain Restrictions Weight Bearing Restrictions: No Other Position/Activity Restrictions: pt self limiting on L LE WBing due to pain      Mobility  Bed Mobility Overal bed mobility: Needs Assistance Bed Mobility: Supine to Sit     Supine to sit: Min assist;HOB elevated     General bed mobility comments: HOB slightly elevated and max multimodal cues for sequencing, minA for trunk elevation    Transfers Overall transfer level: Needs assistance Equipment used: Rolling walker (2 wheeled) Transfers: Sit to/from [facility] Sit to Stand: Mod assist;+2 physical assistance Stand pivot transfers: Mod assist;+2 physical  assistance;+2 safety/equipment       General transfer comment: modA to support pt during transition of hands from bed to RW, limited L LE WBing tolerance requiring modAx2 to complete std pvt to chair with RW, pt given step by step cues to off weight L LE however suspect due to generalized weaknes pt unable to suspend self with UES  Ambulation/Gait             General Gait Details: Health visitor    Modified Rankin (Stroke Patients Only)       Balance Overall balance assessment: Needs assistance Sitting-balance support: Feet supported;No upper extremity supported Sitting balance-Leahy Scale: Fair     Standing balance support: Bilateral upper extremity supported Standing balance-Leahy Scale: Poor Standing balance comment: Reliant on external assist.                             Pertinent Vitals/Pain Pain Assessment: Faces Faces Pain Scale: Hurts whole lot Pain Location: L LE with WBing Pain Descriptors / Indicators: Aching;Grimacing;Constant;Guarding Pain Intervention(s): Limited activity within patient's tolerance    Home Living                        Prior Function                 Hand Dominance        Extremity/Trunk Assessment                Communication      Cognition Arousal/Alertness: Awake/alert Behavior During Therapy:  WFL for tasks assessed/performed Overall Cognitive Status: Within Functional Limits for tasks assessed                                 General Comments: pt orientedx3 today, able to follow commands and gave great effort during therapy, pt held conversation as well      General Comments General comments (skin integrity, edema, etc.): VSS on RA    Exercises General Exercises - Lower Extremity Long Arc Quad: AROM;Both;10 reps;Seated Heel Slides: AROM;Both;10 reps;Seated (with resistance) Toe Raises: AROM;Both;10 reps;Seated Heel Raises: Both;10  reps;Seated   Assessment/Plan    PT Assessment    PT Problem List         PT Treatment Interventions      PT Goals (Current goals can be found in the Care Plan section)  Acute Rehab PT Goals Patient Stated Goal: stop this L leg pain    Frequency Min 3X/week   Barriers to discharge        Co-evaluation               AM-PAC PT "6 Clicks" Mobility  Outcome Measure Help needed turning from your back to your side while in a flat bed without using bedrails?: A Little Help needed moving from lying on your back to sitting on the side of a flat bed without using bedrails?: A Little Help needed moving to and from a bed to a chair (including a wheelchair)?: A Lot Help needed standing up from a chair using your arms (e.g., wheelchair or bedside chair)?: A Lot Help needed to walk in hospital room?: A Lot Help needed climbing 3-5 steps with a railing? : Total 6 Click Score: 13    End of Session Equipment Utilized During Treatment: Gait belt Activity Tolerance: Patient limited by pain Patient left: in chair;with call bell/phone within reach;with chair alarm set Nurse Communication: Mobility status PT Visit Diagnosis: Other abnormalities of gait and mobility (R26.89);Muscle weakness (generalized) (M62.81)    Time: 1224-8250 PT Time Calculation (min) (ACUTE ONLY): 26 min   Charges:     PT Treatments $Therapeutic Exercise: 8-22 mins $Therapeutic Activity: 8-22 mins        Kittie Plater, PT, DPT Acute Rehabilitation Services Pager #: [phone] Office #: [phone]   Berline Lopes 04/07/2021, 2:21 PM

## 2021-04-07 NOTE — Progress Notes (Signed)
Patient not seen. ECG reviewed. Stable QTc.  Cardiology will follow as needed.  Elouise Munroe, MD

## 2021-04-07 NOTE — Progress Notes (Signed)
PROGRESS NOTE    Raymond [name]  RCV:[id number] DOB: 09-21-1955 DOA: 04/03/2021 PCP: [name], FNP    Brief Narrative:  65 year old gentleman with history of ESRD on hemodialysis, pancytopenia, chronic systolic heart failure, hypertension, coronary arteries, peripheral arterial disease, COPD, hepatitis C treated presented to emergency room on 9/24 with altered mental status.  A well check was requested by family member.  Patient was found at home unresponsive and hypoglycemic with blood sugars 22.  Subsequently experienced PEA arrest in the field.  CPR and ROSC achieved in 20 minutes of CPR.  He was hypothermic.  Intubated and admitted to ICU.  This is the third admission for this patient with hypoglycemia in the hospital. Admitted and discharged 6/3 with altered mental status and hypoglycemia Admitted and discharged 7/13 with altered mental status and hypoglycemia. This time with severe hypoglycemia and hypothermia causing cardiac arrest. C-peptide elevated.   Assessment & Plan:   Principal Problem:   Cardiac arrest [facility]) Active Problems:   ESRD (end stage renal disease) (HCC)   Hyperkalemia   Hypoglycemia   Pancytopenia (HCC)   Anisocoria   HFrEF (heart failure with reduced ejection fraction) (HCC)   Hypothermia not due to cold exposure   Junctional bradycardia  PEA cardiac arrest: Secondary to hypoglycemia and hypothermia.  Apparently was not eating well. Echocardiogram with ejection fraction 50 to 55%, left ventricular global hypokinesis, severe LVH. Seen by cardiology.  Recommended no further invasive testing.  Hypothermia secondary to hypoglycemia: Probably due to unable to eat. Remained on 10% dextrose to keep blood sugars up.  Now appetite has improved. Regular diet.  Encourage carbohydrate.  Given 1 dose of glucagon 9/27. C-peptide is elevated to 15.  will order MRI of the pancreas to rule out islet cell tumor/insulinoma. Sent for insulin/proinsulin  levels.  Acute metabolic encephalopathy, toxic metabolic encephalopathy with myoclonus: Nonfocal.  Clinically improved.  Acute on chronic hypercapnic respiratory failure: Status post intubation and extubation.  Improved.  ESRD on hemodialysis: Followed by nephrology.  On dialysis schedule.  Paroxysmal A. fib: Rate controlled.  Sinus rhythm.  Patient on amiodarone and metoprolol.   Followed by cardiology.  Severe life-threatening GI bleeding in the past, not on any anticoagulation.  Chronic systolic heart failure: Patient on metoprolol.  Physical debility and deconditioning: Followed by PT OT.  Recommended CIR.   DVT prophylaxis: heparin injection 5,000 Units Start: 04/03/21 1400 SCDs Start: 04/03/21 0130   Code Status: Full code Family Communication: None Disposition Plan: Status is: Inpatient  Remains inpatient appropriate because:IV treatments appropriate due to intensity of illness or inability to take PO and Inpatient level of care appropriate due to severity of illness  Dispo: The patient is from: Home              Anticipated d/c is to: CIR              Patient currently is not medically stable to d/c.   Difficult to place patient No         Consultants:  PCCM Cardiology Neurology  Procedures:  None none  Antimicrobials:  None   Subjective: .  Patient seen and examined.  He complained of leg pain left leg more than right leg.  Wanted to use some pain medications.  He was feeling weak otherwise denies any other complaints.  Denies any chest pain or shortness of breath.  Last blood sugar was 94 and he ate full breakfast.  Objective: Vitals:   04/07/21 0721 04/07/21 0800  04/07/21 0900 04/07/21 1000  BP: (!) 128/99 115/88  119/90  Pulse: 91 86 93 90  Resp: 20   19  Temp: 97.7 F (36.5 C)     TempSrc: Oral     SpO2: 97% 94% 97% (!) 67%  Weight:      Height:        Intake/Output Summary (Last 24 hours) at 04/07/2021 1314 Last data filed at 04/07/2021  1000 Gross per 24 hour  Intake 1585.44 ml  Output 2000 ml  Net -414.56 ml   Filed Weights   04/06/21 0500 04/06/21 1512 04/07/21 0500  Weight: 79 kg 77 kg 77.8 kg    Examination:  General exam: Appears calm and comfortable  Chronically sick looking.  Debilitated.  Not in any distress. Respiratory system: Clear to auscultation. Respiratory effort normal.  No added sounds. Cardiovascular system: S1 & S2 heard, RRR.  2+ bilateral pedal edema.  Left more than right. Gastrointestinal system: Abdomen is nondistended, soft and nontender. No organomegaly or masses felt. Normal bowel sounds heard. Central nervous system: Alert and oriented. No focal neurological deficits. Extremities: Symmetric 5 x 5 power. Skin: No rashes, lesions or ulcers Psychiatry: Judgement and insight appear normal. Mood & affect appropriate.     Data Reviewed: I have personally reviewed following labs and imaging studies  CBC: Recent Labs  Lab 04/03/21 0025 04/03/21 0046 04/03/21 0530 04/04/21 0500 04/04/21 0638 04/05/21 1134 04/06/21 0801 04/07/21 0135  WBC 1.5*  --  0.7* 3.2*  --  5.0 4.5 3.5*  NEUTROABS 0.7*  --   --  2.5  --   --   --   --   HGB 11.6*   < > 11.4* 11.1* 11.9* 9.7* 9.3* 9.1*  HCT 39.0   < > 37.0* 33.3* 35.0* 30.3* 28.5* 28.1*  MCV 114.7*  --  108.2* 101.5*  --  102.0* 100.7* 101.4*  PLT 62*  --  57* 75*  --  69* 75* 74*   < > = values in this interval not displayed.   Basic Metabolic Panel: Recent Labs  Lab 04/03/21 0530 04/03/21 1059 04/03/21 1629 04/04/21 0113 04/04/21 0500 04/04/21 0638 04/05/21 1134 04/06/21 0801 04/07/21 0135  NA 135 140   < > 137 135 136 135 126* 129*  K 5.6* 5.1   < > 3.0* 3.3* 4.2 5.1 4.9 4.4  CL 96* 100   < > 99 91*  --  94* 89* 93*  CO2 13* 16*   < > 24 27  --  _0 GLUCOSE 721* 329*   < > 126* 387*  --  92 102* 112*  BUN 94* 104*   < > 39* 51*  --  69* 75* 34*  CREATININE 18.17* 19.63*   < > 8.18* 10.60*  --  12.35* 12.53* 7.72*   CALCIUM 9.1 9.0   < > 9.0 7.4*  --  8.4* 8.4* 8.4*  MG 2.3 2.5*  --   --  1.9  --  2.0 2.1  --   PHOS >30.0* >30.1*  --   --   --   --  8.6*  --   --    < > = values in this interval not displayed.   GFR: Estimated Creatinine Clearance: 8.7 mL/min (A) (by C-G formula based on SCr of 7.72 mg/dL (H)). Liver Function Tests: Recent Labs  Lab 04/03/21 0025 04/04/21 0500 04/05/21 1134  AST _1 ALT _2 ALKPHOS 55 42  51  BILITOT 0.7 0.9 1.0  PROT 7.1 5.4* 6.7  ALBUMIN 3.1* 2.2* 2.6*   Recent Labs  Lab 04/03/21 0025  LIPASE 58*   No results for input(s): AMMONIA in the last 168 hours. Coagulation Profile: No results for input(s): INR, PROTIME in the last 168 hours. Cardiac Enzymes: Recent Labs  Lab 04/03/21 0530  CKTOTAL 815*  CKMB 18.0*   BNP (last 3 results) No results for input(s): PROBNP in the last 8760 hours. HbA1C: No results for input(s): HGBA1C in the last 72 hours. CBG: Recent Labs  Lab 04/06/21 2011 04/06/21 2308 04/07/21 0310 04/07/21 0718 04/07/21 1130  GLUCAP 159* 162* 147* 77 94   Lipid Profile: No results for input(s): CHOL, HDL, LDLCALC, TRIG, CHOLHDL, LDLDIRECT in the last 72 hours. Thyroid Function Tests: No results for input(s): TSH, T4TOTAL, FREET4, T3FREE, THYROIDAB in the last 72 hours. Anemia Panel: No results for input(s): VITAMINB12, FOLATE, FERRITIN, TIBC, IRON, RETICCTPCT in the last 72 hours. Sepsis Labs: Recent Labs  Lab 04/03/21 0029 04/03/21 0530  LATICACIDVEN 6.1* 5.1*    Recent Results (from the past 240 hour(s))  Resp Panel by RT-PCR (Flu A&B, Covid) Nasopharyngeal Swab     Status: None   Collection Time: 04/03/21 12:26 AM   Specimen: Nasopharyngeal Swab; Nasopharyngeal(NP) swabs in vial transport medium  Result Value Ref Range Status   SARS Coronavirus 2 by RT PCR NEGATIVE NEGATIVE Final    Comment: (NOTE) SARS-CoV-2 target nucleic acids are NOT DETECTED.  The SARS-CoV-2 RNA is generally detectable in  upper respiratory specimens during the acute phase of infection. The lowest concentration of SARS-CoV-2 viral copies this assay can detect is 138 copies/mL. A negative result does not preclude SARS-Cov-2 infection and should not be used as the sole basis for treatment or other patient management decisions. A negative result may occur with  improper specimen collection/handling, submission of specimen other than nasopharyngeal swab, presence of viral mutation(s) within the areas targeted by this assay, and inadequate number of viral copies(<138 copies/mL). A negative result must be combined with clinical observations, patient history, and epidemiological information. The expected result is Negative.  Fact Sheet for Patients:  EntrepreneurPulse.com.au  Fact Sheet for Healthcare Providers:  IncredibleEmployment.be  This test is no t yet approved or cleared by the [place] FDA and  has been authorized for detection and/or diagnosis of SARS-CoV-2 by FDA under an Emergency Use Authorization (EUA). This EUA will remain  in effect (meaning this test can be used) for the duration of the COVID-19 declaration under Section 564(b)(1) of the Act, 21 U.S.C.section 360bbb-3(b)(1), unless the authorization is terminated  or revoked sooner.       Influenza A by PCR NEGATIVE NEGATIVE Final   Influenza B by PCR NEGATIVE NEGATIVE Final    Comment: (NOTE) The Xpert Xpress SARS-CoV-2/FLU/RSV plus assay is intended as an aid in the diagnosis of influenza from Nasopharyngeal swab specimens and should not be used as a sole basis for treatment. Nasal washings and aspirates are unacceptable for Xpert Xpress SARS-CoV-2/FLU/RSV testing.  Fact Sheet for Patients: EntrepreneurPulse.com.au  Fact Sheet for Healthcare Providers: IncredibleEmployment.be  This test is not yet approved or cleared by the [place] FDA and has been  authorized for detection and/or diagnosis of SARS-CoV-2 by FDA under an Emergency Use Authorization (EUA). This EUA will remain in effect (meaning this test can be used) for the duration of the COVID-19 declaration under Section 564(b)(1) of the Act, 21 U.S.C. section 360bbb-3(b)(1), unless the authorization is  terminated or revoked.  Performed at [facility], [place] [address], [place], [place] [address]   Culture, blood (routine x 2)     Status: None (Preliminary result)   Collection Time: 04/03/21  1:45 AM   Specimen: BLOOD RIGHT ARM  Result Value Ref Range Status   Specimen Description BLOOD RIGHT ARM  Final   Special Requests   Final    BOTTLES DRAWN AEROBIC AND ANAEROBIC Blood Culture adequate volume   Culture   Final    NO GROWTH 4 DAYS Performed at [facility], [place] [address] [place], [place] [address]    Report Status PENDING  Incomplete  MRSA Next Gen by PCR, Nasal     Status: None   Collection Time: 04/03/21  2:41 AM   Specimen: Nasal Mucosa; Nasal Swab  Result Value Ref Range Status   MRSA by PCR Next Gen NOT DETECTED NOT DETECTED Final    Comment: (NOTE) The GeneXpert MRSA Assay (FDA approved for NASAL specimens only), is one component of a comprehensive MRSA colonization surveillance program. It is not intended to diagnose MRSA infection nor to guide or monitor treatment for MRSA infections. Test performance is not FDA approved in patients less than 22 years old. Performed at [facility], [place] [address], [place], [place] [address]   Culture, blood (routine x 2)     Status: None (Preliminary result)   Collection Time: 04/03/21  5:41 AM   Specimen: BLOOD RIGHT HAND  Result Value Ref Range Status   Specimen Description BLOOD RIGHT HAND  Final   Special Requests AEROBIC BOTTLE ONLY Blood Culture adequate volume  Final   Culture   Final    NO GROWTH 4 DAYS Performed at [facility], [place] [address], [place],  [address]    Report  Status PENDING  Incomplete         Radiology Studies: No results found.      Scheduled Meds:  budesonide (PULMICORT) nebulizer solution  0.25 mg Nebulization BID   chlorhexidine gluconate (MEDLINE KIT)  15 mL Mouth Rinse BID   Chlorhexidine Gluconate Cloth  6 each Topical Q0600   feeding supplement  1 Container Oral TID BM   folic acid  1 mg Oral Daily   heparin injection (subcutaneous)  5,000 Units Subcutaneous Q8H   ipratropium-albuterol  3 mL Nebulization BID   multivitamin  1 tablet Oral QHS   pantoprazole  40 mg Oral QHS   thiamine  100 mg Oral Daily   Continuous Infusions:  sodium chloride Stopped (04/05/21 2244)     LOS: 4 days    Time spent: 35 minutes    Barb Merino, MD [facility] Pager [phone]

## 2021-04-08 ENCOUNTER — Encounter (HOSPITAL_COMMUNITY): Payer: 59

## 2021-04-08 ENCOUNTER — Inpatient Hospital Stay (HOSPITAL_COMMUNITY): Payer: 59

## 2021-04-08 LAB — RENAL FUNCTION PANEL
Albumin: 2.5 g/dL — ABNORMAL LOW (ref 3.5–5.0)
Anion gap: 14 (ref 5–15)
BUN: 52 mg/dL — ABNORMAL HIGH (ref 8–23)
CO2: 24 mmol/L (ref 22–32)
Calcium: 9 mg/dL (ref 8.9–10.3)
Chloride: 92 mmol/L — ABNORMAL LOW (ref 98–111)
Creatinine, Ser: 10.05 mg/dL — ABNORMAL HIGH (ref 0.61–1.24)
GFR, Estimated: 5 mL/min — ABNORMAL LOW
Glucose, Bld: 101 mg/dL — ABNORMAL HIGH (ref 70–99)
Phosphorus: 8.2 mg/dL — ABNORMAL HIGH (ref 2.5–4.6)
Potassium: 4.4 mmol/L (ref 3.5–5.1)
Sodium: 130 mmol/L — ABNORMAL LOW (ref 135–145)

## 2021-04-08 LAB — GLUCOSE, CAPILLARY
Glucose-Capillary: 102 mg/dL — ABNORMAL HIGH (ref 70–99)
Glucose-Capillary: 114 mg/dL — ABNORMAL HIGH (ref 70–99)
Glucose-Capillary: 65 mg/dL — ABNORMAL LOW (ref 70–99)
Glucose-Capillary: 76 mg/dL (ref 70–99)
Glucose-Capillary: 92 mg/dL (ref 70–99)
Glucose-Capillary: 94 mg/dL (ref 70–99)
Glucose-Capillary: 95 mg/dL (ref 70–99)

## 2021-04-08 LAB — CBC
HCT: 28.1 % — ABNORMAL LOW (ref 39.0–52.0)
Hemoglobin: 8.9 g/dL — ABNORMAL LOW (ref 13.0–17.0)
MCH: 32.6 pg (ref 26.0–34.0)
MCHC: 31.7 g/dL (ref 30.0–36.0)
MCV: 102.9 fL — ABNORMAL HIGH (ref 80.0–100.0)
Platelets: 87 10*3/uL — ABNORMAL LOW (ref 150–400)
RBC: 2.73 MIL/uL — ABNORMAL LOW (ref 4.22–5.81)
RDW: 17.5 % — ABNORMAL HIGH (ref 11.5–15.5)
WBC: 3.5 10*3/uL — ABNORMAL LOW (ref 4.0–10.5)
nRBC: 0 % (ref 0.0–0.2)

## 2021-04-08 LAB — CULTURE, BLOOD (ROUTINE X 2)
Culture: NO GROWTH
Culture: NO GROWTH
Special Requests: ADEQUATE
Special Requests: ADEQUATE

## 2021-04-08 IMAGING — MR MR ABDOMEN W/O CM
4 of 8 series · 18 of 48 positions shown · non-contrast
Comparison: CT scan [DATE]

CLINICAL DATA: Gastrointestinal cancer. Restaging. Rule out
pancreatic tumor.

EXAM:
MRI ABDOMEN WITHOUT CONTRAST
TECHNIQUE: Multiplanar multisequence MR imaging was performed without the
administration of intravenous contrast.

[Series 3: ax ssfse nav · axial · 6.0mm · 0.74mm/px · z∈[-160,+86]mm · 5 of 42 slices shown]
[im 1/42]
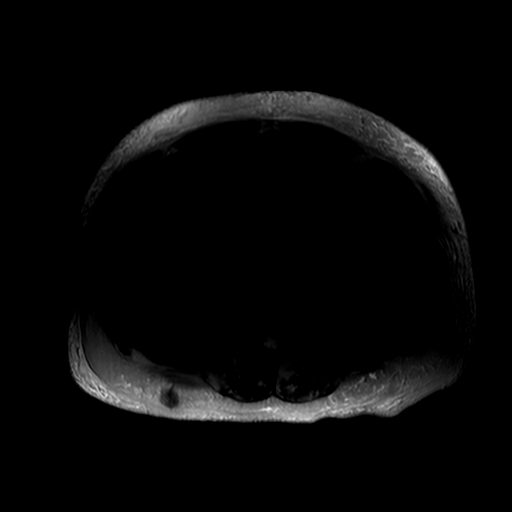
[im 11/42]
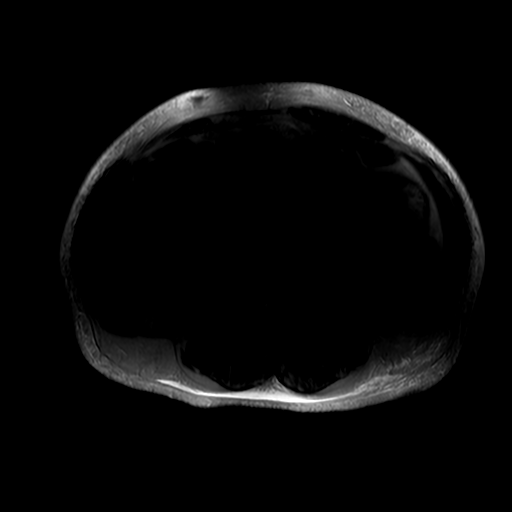
[im 21/42]
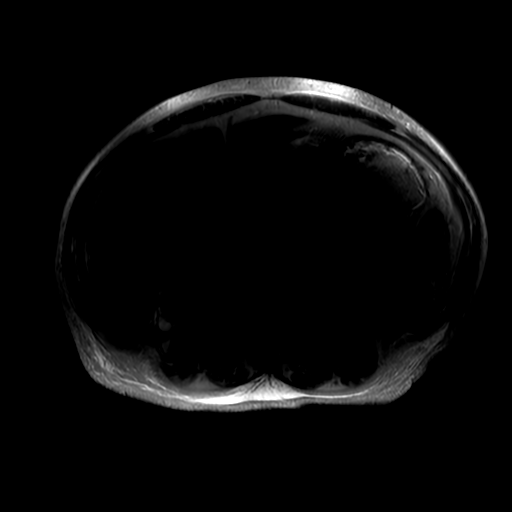
[im 31/42]
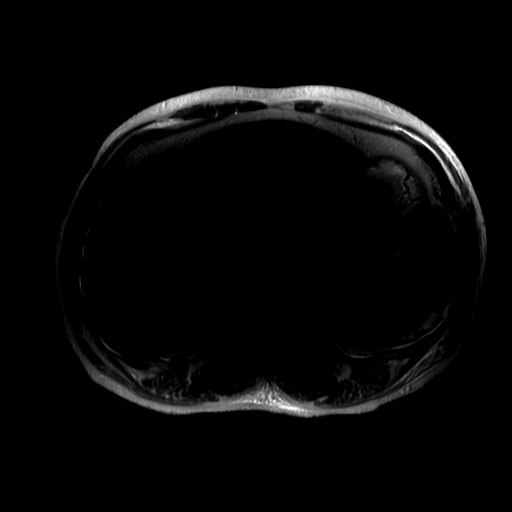
[im 42/42]
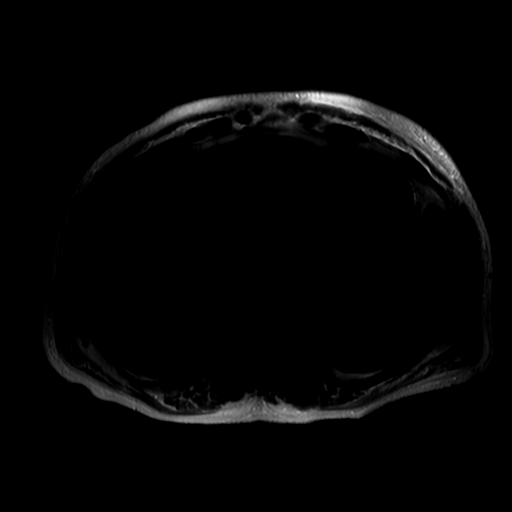

[Series 6: cor ssfse nav · coronal · 6.0mm · 0.78mm/px · 3 of 35 slices shown]
[im 1/35]
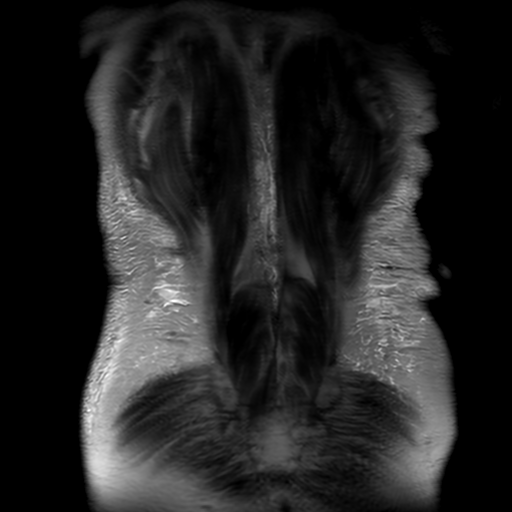
[im 18/35]
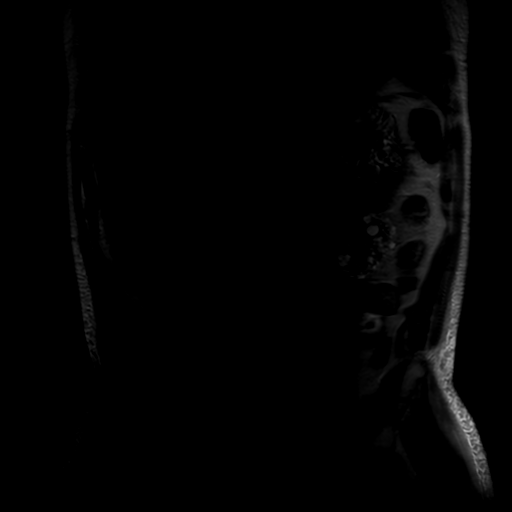
[im 35/35]
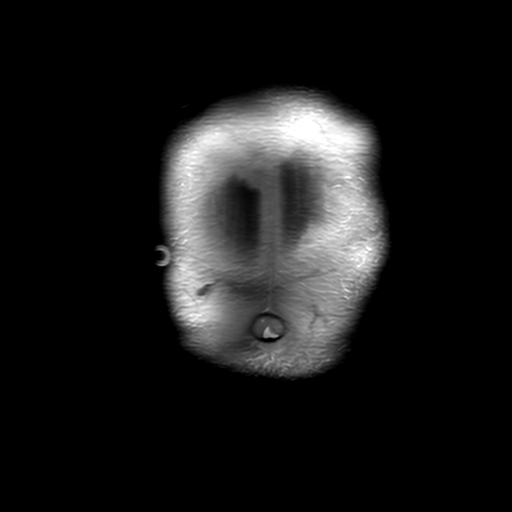

[Series 8: T1 dynamic · axial · non-contrast · 4.0mm · 0.78mm/px · z∈[-159,+47]mm · 7 of 116 slices shown]
[im 1/116]
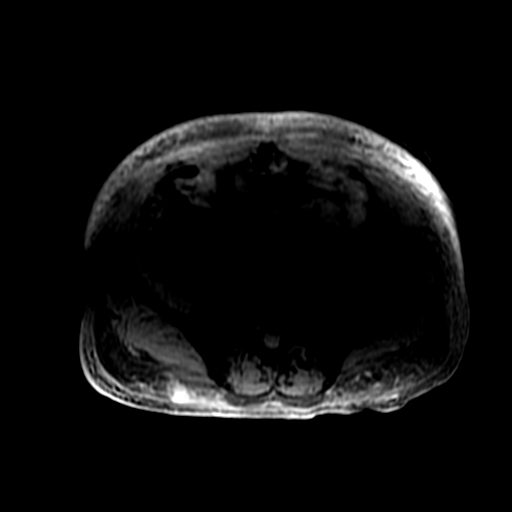
[im 12/116]
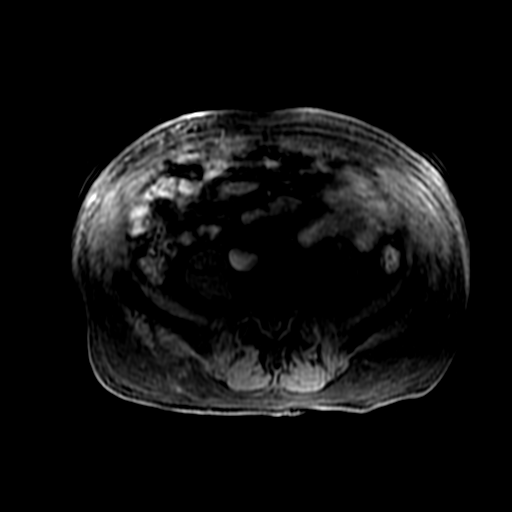
[im 24/116]
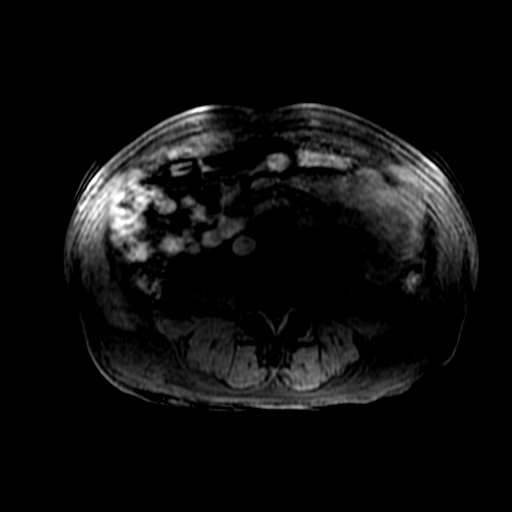
[im 35/116]
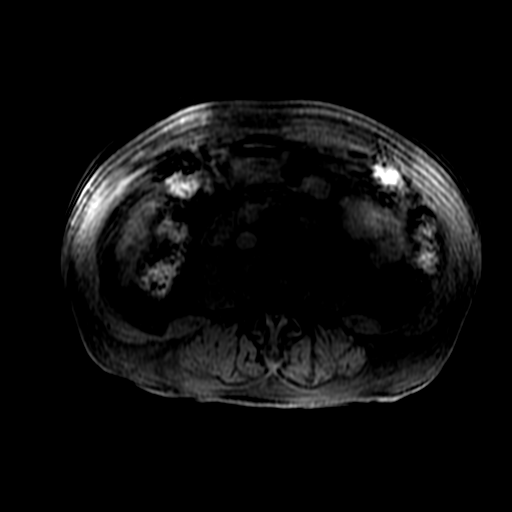
[im 47/116]
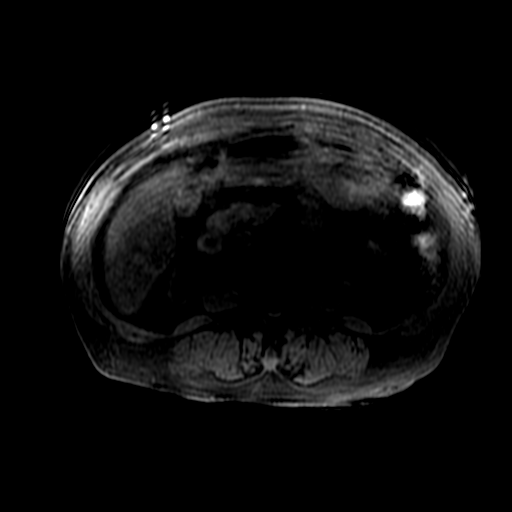
[im 58/116]
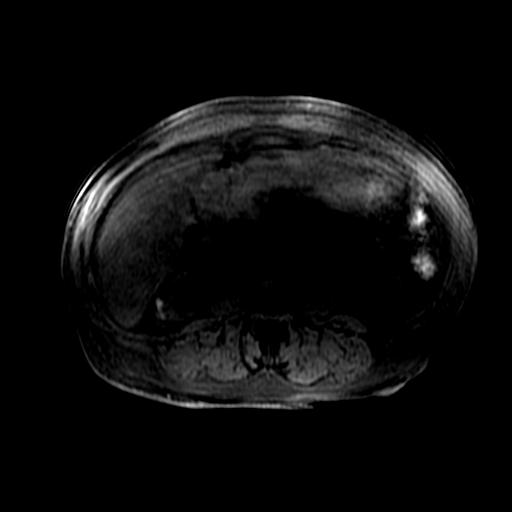
[im 104/116]
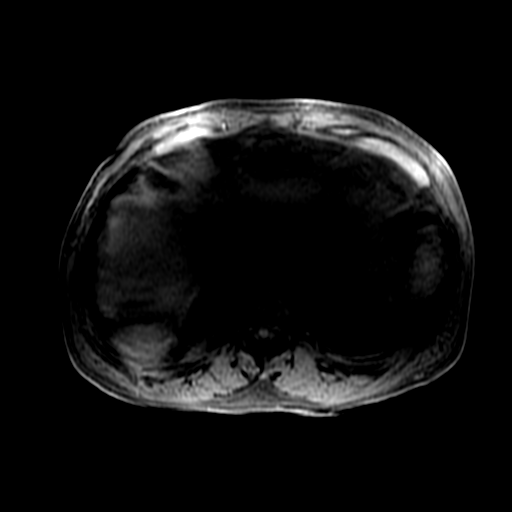

[Series 9: T2 fat-sat · axial · 6.0mm · 0.78mm/px · z∈[-127,+71]mm · 3 of 31 slices shown]
[im 1/31]
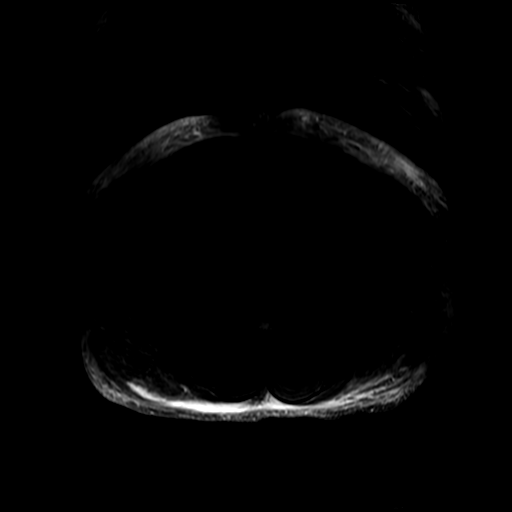
[im 16/31]
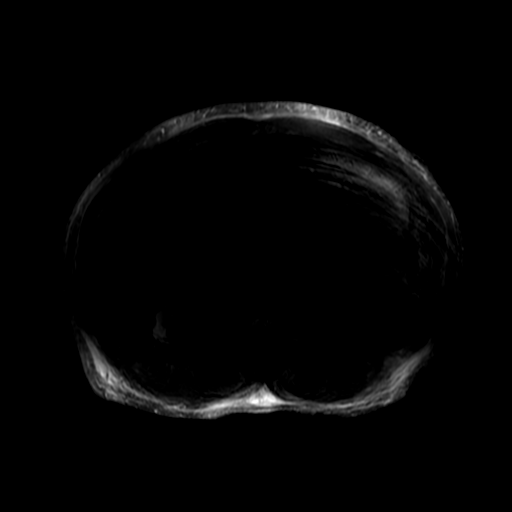
[im 31/31]
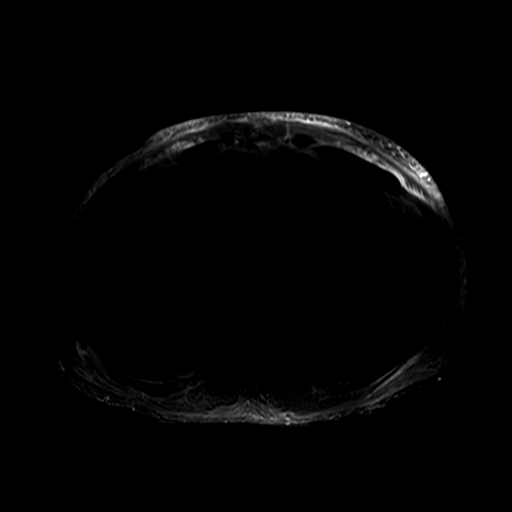

[18 of 48 positions shown; findings below may reference images not displayed]

FINDINGS: Examination is quite limited due to respiratory motion.

Lower chest: The lung bases are grossly clear. No pleural or
pericardial effusion. Stable cardiac enlargement.

Hepatobiliary: Changes of iron deposition disease. The liver and
spleen demonstrate extremely low T2 signal intensity and decreased
T1 signal intensity. No obvious hepatic lesions or intrahepatic
biliary dilatation. Mild uniform gallbladder wall thickening but no
gallstones. Normal caliber and course of the common bile duct.

Pancreas: No pancreatic mass is identified. No evidence of acute
inflammatory process.

Spleen: Normal size. Very low T2 signal intensity consistent with
iron deposition disease.

Adrenals/Urinary Tract: Small kidneys with innumerable cysts likely
related to hemodialysis. Some of these demonstrate increased T1
signal intensity suggesting hemorrhagic cyst. I do not see an
obvious worrisome renal lesion.

The adrenal glands are normal.

Stomach/Bowel: The stomach, duodenum, visualized small bowel and
visualized colon are grossly normal.

Vascular/Lymphatic: No aortic aneurysm. Advanced atherosclerotic
vascular disease. No mesenteric or retroperitoneal mass or
adenopathy.

Other:  Small amount of free abdominal and free pelvic fluid.

Musculoskeletal: No significant bony findings.
IMPRESSION: 1. Very limited examination due to respiratory motion.
2. No evidence of pancreatic mass or acute inflammatory process.
3. Changes of iron deposition disease involving the liver and
spleen.
4. Small amount of free abdominal and free pelvic fluid.
5. Small kidneys with innumerable cysts likely related to
hemodialysis.

## 2021-04-08 MED ORDER — HEPARIN SODIUM (PORCINE) 1000 UNIT/ML DIALYSIS: 40.0000 [IU]/kg | INTRAMUSCULAR | Status: DC | PRN

## 2021-04-08 MED ORDER — SEVELAMER CARBONATE 800 MG PO TABS
3200.0000 mg | ORAL_TABLET | Freq: Three times a day (TID) | ORAL | Status: DC
  Administered 2021-04-09 – 2021-04-15 (×18): 3200 mg via ORAL
  Filled 2021-04-08 (×18): qty 4

## 2021-04-08 MED ORDER — DEXTROSE 50 % IV SOLN
12.5000 g | INTRAVENOUS | Status: AC
  Administered 2021-04-08: 12.5 g via INTRAVENOUS
  Filled 2021-04-08: qty 50

## 2021-04-08 MED ORDER — CINACALCET HCL 30 MG PO TABS
180.0000 mg | ORAL_TABLET | ORAL | Status: DC
  Administered 2021-04-10 – 2021-04-15 (×3): 180 mg via ORAL
  Filled 2021-04-08 (×6): qty 6

## 2021-04-08 MED ORDER — TRAZODONE HCL 50 MG PO TABS
50.0000 mg | ORAL_TABLET | Freq: Every evening | ORAL | Status: DC | PRN
  Administered 2021-04-08: 50 mg via ORAL
  Filled 2021-04-08: qty 1

## 2021-04-08 NOTE — Progress Notes (Signed)
Hypoglycemic Event  CBG: 65  Treatment: D50 25 mL (12.5 gm)  Symptoms: None  Follow-up CBG: Time: 0406 CBG Result:102  Possible Reasons for Event: Other: D10 not running at the time      [employer]

## 2021-04-08 NOTE — Progress Notes (Signed)
Patient ID: Raymond [name], male   DOB: Jan 30, 1956, 65 y.o.   MRN: [id number] [facility] Progress Note   Assessment/ Plan:   1.  Acute encephalopathy: Status postcardiac arrest with likely element of hypoxic/anoxic brain injury possibly compounded by hypoglycemia. Mental status improved and appears back to baseline on my assessment. MRI abdomen today to eval for pancreatic tumor.  2. ESRD: On schedule for hemodialysis today to continue TTS schedule; no volume excess noted.  3. Anemia: Hemoglobin and hematocrit at acceptable range and without indications for ESA at this time.  No overt blood loss noted. 4. CKD-MBD: Significantly elevated phosphorus levels noted-likely with poor adherence to outpatient binders/diet.  Binders restarted.  5. Nutrition: Weaned off D10W and continues to have intermittent hypoglycemia with oral intake and needing PRN D50.   6. Hypertension: Blood pressure currently at goal, continue to follow.  Subjective:   Intermittently coughing after choking on breakfast while rushing to eat it.     Objective:   BP (!) 143/105   Pulse 87   Temp 98.3 F (36.8 C) (Oral)   Resp 16   Ht '5\' 7"'  (1.702 m)   Wt 80.5 kg   SpO2 97%   BMI 27.80 kg/m   Physical Exam: Gen: Coughing intermittently while in bed, awake/alert CVS: Pulse regular rhythm, normal rate, S1 and S2 normal Resp: Clear to auscultation bilaterally, no rales/rhonchi Abd: Soft, flat, nontender, bowel sounds normal Ext: Trace left ankle edema, no right lower extremity edema.  Left upper arm AV fistula-pulsatile  Labs: BMET Recent Labs  Lab 04/03/21 0530 04/03/21 1059 04/03/21 1629 04/03/21 2100 04/04/21 0113 04/04/21 0500 04/04/21 0638 04/05/21 1134 04/06/21 0801 04/07/21 0135  NA 135 140 141 141 137 135 136 135 126* 129*  K 5.6* 5.1 5.3* 5.1 3.0* 3.3* 4.2 5.1 4.9 4.4  CL 96* 100 101 102 99 91*  --  94* 89* 93*  CO2 13* 16* 14* 19* 24 27  --  '24 22 25  ' GLUCOSE 721* 329* 224* 173* 126*  387*  --  92 102* 112*  BUN 94* 104* 111* 115* 39* 51*  --  69* 75* 34*  CREATININE 18.17* 19.63* 20.24* 20.00* 8.18* 10.60*  --  12.35* 12.53* 7.72*  CALCIUM 9.1 9.0 8.8* 8.9 9.0 7.4*  --  8.4* 8.4* 8.4*  PHOS >30.0* >30.1*  --   --   --   --   --  8.6*  --   --    CBC Recent Labs  Lab 04/03/21 0025 04/03/21 0046 04/04/21 0500 04/04/21 0638 04/05/21 1134 04/06/21 0801 04/07/21 0135  WBC 1.5*   < > 3.2*  --  5.0 4.5 3.5*  NEUTROABS 0.7*  --  2.5  --   --   --   --   HGB 11.6*   < > 11.1* 11.9* 9.7* 9.3* 9.1*  HCT 39.0   < > 33.3* 35.0* 30.3* 28.5* 28.1*  MCV 114.7*   < > 101.5*  --  102.0* 100.7* 101.4*  PLT 62*   < > 75*  --  69* 75* 74*   < > = values in this interval not displayed.     Medications:     budesonide (PULMICORT) nebulizer solution  0.25 mg Nebulization BID   chlorhexidine gluconate (MEDLINE KIT)  15 mL Mouth Rinse BID   Chlorhexidine Gluconate Cloth  6 each Topical Q0600   dextrose  1 Tube Oral QID   feeding supplement (NEPRO CARB STEADY)  237 mL  Oral BID BM   folic acid  1 mg Oral Daily   ipratropium-albuterol  3 mL Nebulization BID   multivitamin  1 tablet Oral QHS   pantoprazole  40 mg Oral QHS   thiamine  100 mg Oral Daily   Elmarie Shiley, MD 04/08/2021, 8:32 AM

## 2021-04-08 NOTE — Progress Notes (Signed)
PROGRESS NOTE    Raymond [name]  MHD:[id number] DOB: February 27, 1956 DOA: 04/03/2021 PCP: [name], FNP    Brief Narrative:  65 year old gentleman with history of ESRD on hemodialysis, pancytopenia, chronic systolic heart failure, hypertension, coronary arteries, peripheral arterial disease, COPD, hepatitis C treated presented to emergency room on 9/24 with altered mental status.  A well check was requested by family member.  Patient was found at home unresponsive and hypoglycemic with blood sugars 22.  Subsequently experienced PEA arrest in the field.  CPR and ROSC achieved in 20 minutes of CPR.  He was hypothermic.  Intubated and admitted to ICU.  This is the third admission for this patient with hypoglycemia in the hospital. Admitted and discharged 6/3 with altered mental status and hypoglycemia Admitted and discharged 7/13 with altered mental status and hypoglycemia. This time with severe hypoglycemia and hypothermia causing cardiac arrest. C-peptide elevated.  Did not respond to octreotide.   Assessment & Plan:   Principal Problem:   Cardiac arrest [facility]) Active Problems:   ESRD (end stage renal disease) (HCC)   Hyperkalemia   Hypoglycemia   Pancytopenia (HCC)   Anisocoria   HFrEF (heart failure with reduced ejection fraction) (HCC)   Hypothermia not due to cold exposure   Junctional bradycardia  PEA cardiac arrest: Secondary to hypoglycemia and hypothermia.  Apparently was not eating well. Echocardiogram with ejection fraction 50 to 55%, left ventricular global hypokinesis, severe LVH. Seen by cardiology.  Recommended no further invasive testing.  Hypothermia secondary to hypoglycemia: Probably due to unable to eat. Remained on 10% dextrose to keep blood sugars up.  Now appetite has improved. Regular diet.  Encourage carbohydrate.  Given 1 dose of glucagon 9/27. Keeping on oral dextrose replacement. C-peptide is elevated to 15.  will order MRI of the pancreas to rule out  islet cell tumor/insulinoma. Sent for insulin/proinsulin levels, results pending.  Acute metabolic encephalopathy, toxic metabolic encephalopathy with myoclonus: Nonfocal.  Clinically improved.  Acute on chronic hypercapnic respiratory failure: Status post intubation and extubation.  Improved.  On room air today.  ESRD on hemodialysis: Followed by nephrology.  On dialysis schedule.  Paroxysmal A. fib: Rate controlled.  Sinus rhythm.  Patient on amiodarone and metoprolol.   Followed by cardiology.  Severe GI bleeding in the past, not on any anticoagulation. Will check lower extremity duplexes, if he has any DVT, benefit of anticoagulation outweigh risk of bleeding.   Chronic systolic heart failure: Patient on metoprolol.  Physical debility and deconditioning: Followed by PT OT.  Recommended CIR.   DVT prophylaxis: SCDs Start: 04/03/21 0130   Code Status: Full code Family Communication: None Disposition Plan: Status is: Inpatient  Remains inpatient appropriate because:IV treatments appropriate due to intensity of illness or inability to take PO and Inpatient level of care appropriate due to severity of illness  Dispo: The patient is from: Home              Anticipated d/c is to: CIR              Patient currently is not medically stable to d/c.   Difficult to place patient No         Consultants:  PCCM Cardiology Neurology  Procedures:  None none  Antimicrobials:  None   Subjective: Patient seen and examined.  Had a rough night with low blood sugars.  Denies any other complaints.  Denies any chest pain or shortness of breath.  Leg pain persist. He has persistent leg swelling  and has history of peripheral arterial disease.  Previously intolerance to anticoagulation.  We will check lower extremity duplexes.  Objective: Vitals:   04/08/21 0800 04/08/21 0900 04/08/21 1000 04/08/21 1131  BP:      Pulse: (!) 118 93 85   Resp: (!) 22 18    Temp:    98.3 F (36.8 C)   TempSrc:    Oral  SpO2: 97% 96% 90%   Weight:      Height:        Intake/Output Summary (Last 24 hours) at 04/08/2021 1341 Last data filed at 04/08/2021 1000 Gross per 24 hour  Intake 943.17 ml  Output --  Net 943.17 ml   Filed Weights   04/06/21 1512 04/07/21 0500 04/08/21 0351  Weight: 77 kg 77.8 kg 80.5 kg    Examination:  General exam: Appears calm and comfortable .Chronically sick looking.  Debilitated.  Not in any distress. Respiratory system: Clear to auscultation. Respiratory effort normal.  No added sounds. Cardiovascular system: S1 & S2 heard, RRR.  2+ bilateral pedal edema.  Left more than right. Gastrointestinal system: Abdomen is nondistended, soft and nontender. No organomegaly or masses felt. Normal bowel sounds heard. Central nervous system: Alert and oriented. No focal neurological deficits. Extremities: Symmetric 5 x 5 power. Skin: No rashes, lesions or ulcers Psychiatry: Judgement and insight appear normal. Mood & affect flat and anxious.    Data Reviewed: I have personally reviewed following labs and imaging studies  CBC: Recent Labs  Lab 04/03/21 0025 04/03/21 0046 04/03/21 0530 04/04/21 0500 04/04/21 0638 04/05/21 1134 04/06/21 0801 04/07/21 0135  WBC 1.5*  --  0.7* 3.2*  --  5.0 4.5 3.5*  NEUTROABS 0.7*  --   --  2.5  --   --   --   --   HGB 11.6*   < > 11.4* 11.1* 11.9* 9.7* 9.3* 9.1*  HCT 39.0   < > 37.0* 33.3* 35.0* 30.3* 28.5* 28.1*  MCV 114.7*  --  108.2* 101.5*  --  102.0* 100.7* 101.4*  PLT 62*  --  57* 75*  --  69* 75* 74*   < > = values in this interval not displayed.   Basic Metabolic Panel: Recent Labs  Lab 04/03/21 0530 04/03/21 1059 04/03/21 1629 04/04/21 0113 04/04/21 0500 04/04/21 0638 04/05/21 1134 04/06/21 0801 04/07/21 0135  NA 135 140   < > 137 135 136 135 126* 129*  K 5.6* 5.1   < > 3.0* 3.3* 4.2 5.1 4.9 4.4  CL 96* 100   < > 99 91*  --  94* 89* 93*  CO2 13* 16*   < > 24 27  --  '24 22 25  ' GLUCOSE 721* 329*    < > 126* 387*  --  92 102* 112*  BUN 94* 104*   < > 39* 51*  --  69* 75* 34*  CREATININE 18.17* 19.63*   < > 8.18* 10.60*  --  12.35* 12.53* 7.72*  CALCIUM 9.1 9.0   < > 9.0 7.4*  --  8.4* 8.4* 8.4*  MG 2.3 2.5*  --   --  1.9  --  2.0 2.1  --   PHOS >30.0* >30.1*  --   --   --   --  8.6*  --   --    < > = values in this interval not displayed.   GFR: Estimated Creatinine Clearance: 9.4 mL/min (A) (by C-G formula based on SCr of 7.72 mg/dL (H)).  Liver Function Tests: Recent Labs  Lab 04/03/21 0025 04/04/21 0500 04/05/21 1134  AST '29 19 18  ' ALT '18 18 20  ' ALKPHOS 55 42 51  BILITOT 0.7 0.9 1.0  PROT 7.1 5.4* 6.7  ALBUMIN 3.1* 2.2* 2.6*   Recent Labs  Lab 04/03/21 0025  LIPASE 58*   No results for input(s): AMMONIA in the last 168 hours. Coagulation Profile: No results for input(s): INR, PROTIME in the last 168 hours. Cardiac Enzymes: Recent Labs  Lab 04/03/21 0530  CKTOTAL 815*  CKMB 18.0*   BNP (last 3 results) No results for input(s): PROBNP in the last 8760 hours. HbA1C: No results for input(s): HGBA1C in the last 72 hours. CBG: Recent Labs  Lab 04/07/21 2357 04/08/21 0334 04/08/21 0406 04/08/21 0739 04/08/21 1130  GLUCAP 82 65* 102* 76 95   Lipid Profile: No results for input(s): CHOL, HDL, LDLCALC, TRIG, CHOLHDL, LDLDIRECT in the last 72 hours. Thyroid Function Tests: No results for input(s): TSH, T4TOTAL, FREET4, T3FREE, THYROIDAB in the last 72 hours. Anemia Panel: No results for input(s): VITAMINB12, FOLATE, FERRITIN, TIBC, IRON, RETICCTPCT in the last 72 hours. Sepsis Labs: Recent Labs  Lab 04/03/21 0029 04/03/21 0530  LATICACIDVEN 6.1* 5.1*    Recent Results (from the past 240 hour(s))  Resp Panel by RT-PCR (Flu A&B, Covid) Nasopharyngeal Swab     Status: None   Collection Time: 04/03/21 12:26 AM   Specimen: Nasopharyngeal Swab; Nasopharyngeal(NP) swabs in vial transport medium  Result Value Ref Range Status   SARS Coronavirus 2 by RT PCR  NEGATIVE NEGATIVE Final    Comment: (NOTE) SARS-CoV-2 target nucleic acids are NOT DETECTED.  The SARS-CoV-2 RNA is generally detectable in upper respiratory specimens during the acute phase of infection. The lowest concentration of SARS-CoV-2 viral copies this assay can detect is 138 copies/mL. A negative result does not preclude SARS-Cov-2 infection and should not be used as the sole basis for treatment or other patient management decisions. A negative result may occur with  improper specimen collection/handling, submission of specimen other than nasopharyngeal swab, presence of viral mutation(s) within the areas targeted by this assay, and inadequate number of viral copies(<138 copies/mL). A negative result must be combined with clinical observations, patient history, and epidemiological information. The expected result is Negative.  Fact Sheet for Patients:  EntrepreneurPulse.com.au  Fact Sheet for Healthcare Providers:  IncredibleEmployment.be  This test is no t yet approved or cleared by the [place] FDA and  has been authorized for detection and/or diagnosis of SARS-CoV-2 by FDA under an Emergency Use Authorization (EUA). This EUA will remain  in effect (meaning this test can be used) for the duration of the COVID-19 declaration under Section 564(b)(1) of the Act, 21 U.S.C.section 360bbb-3(b)(1), unless the authorization is terminated  or revoked sooner.       Influenza A by PCR NEGATIVE NEGATIVE Final   Influenza B by PCR NEGATIVE NEGATIVE Final    Comment: (NOTE) The Xpert Xpress SARS-CoV-2/FLU/RSV plus assay is intended as an aid in the diagnosis of influenza from Nasopharyngeal swab specimens and should not be used as a sole basis for treatment. Nasal washings and aspirates are unacceptable for Xpert Xpress SARS-CoV-2/FLU/RSV testing.  Fact Sheet for Patients: EntrepreneurPulse.com.au  Fact Sheet for  Healthcare Providers: IncredibleEmployment.be  This test is not yet approved or cleared by the [place] FDA and has been authorized for detection and/or diagnosis of SARS-CoV-2 by FDA under an Emergency Use Authorization (EUA). This EUA will remain in effect (  meaning this test can be used) for the duration of the COVID-19 declaration under Section 564(b)(1) of the Act, 21 U.S.C. section 360bbb-3(b)(1), unless the authorization is terminated or revoked.  Performed at [facility], [place] [address] [place], [place] [address]   Culture, blood (routine x 2)     Status: None (Preliminary result)   Collection Time: 04/03/21  1:45 AM   Specimen: BLOOD RIGHT ARM  Result Value Ref Range Status   Specimen Description BLOOD RIGHT ARM  Final   Special Requests   Final    BOTTLES DRAWN AEROBIC AND ANAEROBIC Blood Culture adequate volume   Culture   Final    NO GROWTH 4 DAYS Performed at [facility], [place] [address], [place], [place] [address]    Report Status PENDING  Incomplete  MRSA Next Gen by PCR, Nasal     Status: None   Collection Time: 04/03/21  2:41 AM   Specimen: Nasal Mucosa; Nasal Swab  Result Value Ref Range Status   MRSA by PCR Next Gen NOT DETECTED NOT DETECTED Final    Comment: (NOTE) The GeneXpert MRSA Assay (FDA approved for NASAL specimens only), is one component of a comprehensive MRSA colonization surveillance program. It is not intended to diagnose MRSA infection nor to guide or monitor treatment for MRSA infections. Test performance is not FDA approved in patients less than 38 years old. Performed at [facility], [place] [address], [place], [place] [address]   Culture, blood (routine x 2)     Status: None (Preliminary result)   Collection Time: 04/03/21  5:41 AM   Specimen: BLOOD RIGHT HAND  Result Value Ref Range Status   Specimen Description BLOOD RIGHT HAND  Final   Special Requests AEROBIC BOTTLE ONLY Blood Culture  adequate volume  Final   Culture   Final    NO GROWTH 4 DAYS Performed at [facility], [place] [address] [place], [place] [address]    Report Status PENDING  Incomplete         Radiology Studies: No results found.      Scheduled Meds:  budesonide (PULMICORT) nebulizer solution  0.25 mg Nebulization BID   chlorhexidine gluconate (MEDLINE KIT)  15 mL Mouth Rinse BID   Chlorhexidine Gluconate Cloth  6 each Topical Q0600   cinacalcet  180 mg Oral Q T,Th,Sa-HD   dextrose  1 Tube Oral QID   feeding supplement (NEPRO CARB STEADY)  237 mL Oral BID BM   folic acid  1 mg Oral Daily   ipratropium-albuterol  3 mL Nebulization BID   multivitamin  1 tablet Oral QHS   pantoprazole  40 mg Oral QHS   sevelamer carbonate  3,200 mg Oral TID WC   thiamine  100 mg Oral Daily   Continuous Infusions:  sodium chloride Stopped (04/05/21 2244)   dextrose 75 mL/hr at 04/08/21 1000     LOS: 5 days    Time spent: 30 minutes.    Barb Merino, MD [facility] Pager [phone]

## 2021-04-09 ENCOUNTER — Inpatient Hospital Stay (HOSPITAL_COMMUNITY): Payer: 59

## 2021-04-09 LAB — GLUCOSE, CAPILLARY
Glucose-Capillary: 100 mg/dL — ABNORMAL HIGH (ref 70–99)
Glucose-Capillary: 84 mg/dL (ref 70–99)
Glucose-Capillary: 50 mg/dL — ABNORMAL LOW (ref 70–99)
Glucose-Capillary: 57 mg/dL — ABNORMAL LOW (ref 70–99)

## 2021-04-09 LAB — GLUCOSE, RANDOM
Glucose, Bld: 90 mg/dL (ref 70–99)
Glucose, Bld: 77 mg/dL (ref 70–99)
Glucose, Bld: 107 mg/dL — ABNORMAL HIGH (ref 70–99)

## 2021-04-09 IMAGING — DX DG KNEE 1-2V PORT*R*
2 series · 2 of 2 positions shown · non-contrast
Comparison: None.

CLINICAL DATA: Bilateral knee pain, fall on [REDACTED] night.

EXAM:
PORTABLE RIGHT KNEE - 1-2 VIEW

[knee ap]
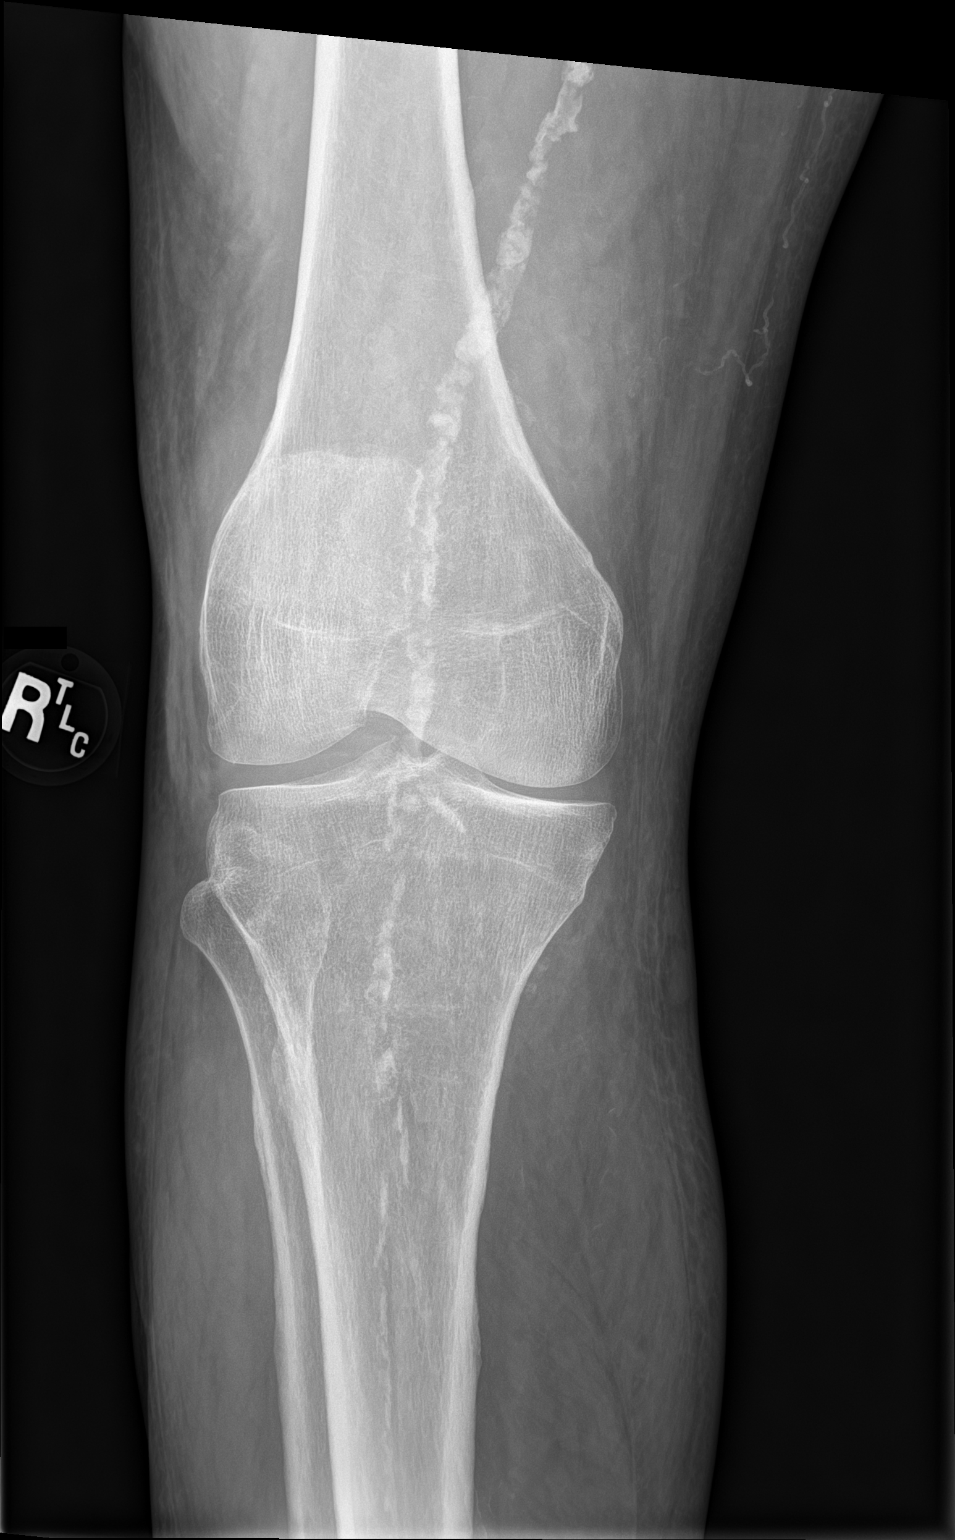

[knee lat]
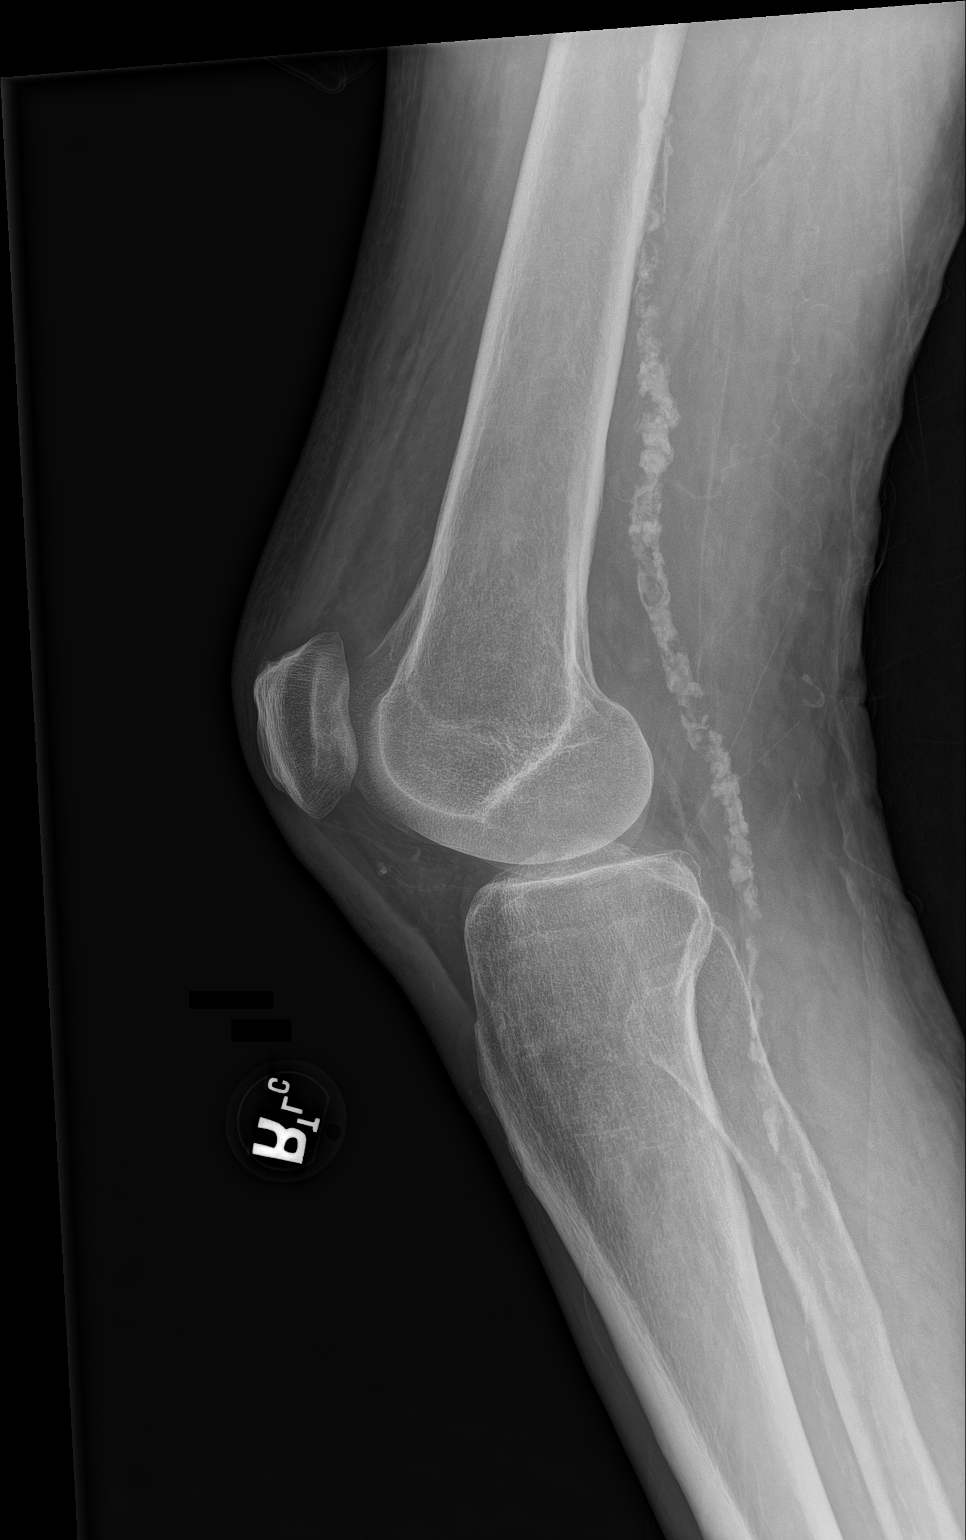

[2 of 2 positions shown; findings below may reference images not displayed]

FINDINGS: Dense atherosclerotic calcifications. Moderate medial compartmental
articular space narrowing but without substantial spurring. Trace
knee effusion. No fracture is identified. Mild prepatellar
subcutaneous edema.
IMPRESSION: 1. Mild prepatellar subcutaneous edema.
2. Trace knee effusion.
3. Mild degenerative loss of articular space in the medial
compartment.
4. Dense atherosclerotic calcifications.

## 2021-04-09 MED ORDER — HEPARIN SODIUM (PORCINE) 1000 UNIT/ML DIALYSIS: 40.0000 [IU]/kg | INTRAMUSCULAR | Status: DC | PRN

## 2021-04-09 MED ORDER — QUETIAPINE FUMARATE 50 MG PO TABS
100.0000 mg | ORAL_TABLET | Freq: Every day | ORAL | Status: DC
  Administered 2021-04-09 – 2021-04-14 (×6): 100 mg via ORAL
  Filled 2021-04-09 (×6): qty 2

## 2021-04-09 MED ORDER — METOPROLOL TARTRATE 50 MG PO TABS
50.0000 mg | ORAL_TABLET | Freq: Two times a day (BID) | ORAL | Status: DC
  Administered 2021-04-09 – 2021-04-14 (×12): 50 mg via ORAL
  Filled 2021-04-09 (×3): qty 1
  Filled 2021-04-09: qty 2
  Filled 2021-04-09 (×8): qty 1

## 2021-04-09 NOTE — Progress Notes (Signed)
Physical Therapy Treatment Patient Details Name: Raymond [name] MRN: [id number] DOB: [birth year] Today's Date: 04/09/2021   History of Present Illness 65 y.o. male admitted 9/24 with initial EMS call for AMS and hypoglycemia with PEA arrest in field and CPR with ROSC after 20 min. Intubated 9/24-9/25. PMhx; COPD, ESRD on iHD, hep C, PAD, HTN, substance abuse, thyroid disease, DM2, mild CAD    PT Comments    Pt pleasant with significantly improved cognition and limited right knee pain. Pt continues to be limited by left foot/ankle pain and edema and pt reports fall at home but unsure as he does not recall admission. Pt with improved mobility and able to progress to gait this session. Pt lives alone with repeat admissions and agreeable to ST-SNf. Will continue to follow.  BP 180/93 after gait HR 104 with gait, SpO2 94% on RA    Recommendations for follow up therapy are one component of a multi-disciplinary discharge planning process, led by the attending physician.  Recommendations may be updated based on patient status, additional functional criteria and insurance authorization.  Follow Up Recommendations  SNF;Supervision for mobility/OOB     Equipment Recommendations  None recommended by PT    Recommendations for Other Services       Precautions / Restrictions Precautions Precautions: Fall Precaution Comments: left ankle edema and pain     Mobility  Bed Mobility   Bed Mobility: Supine to Sit     Supine to sit: Modified independent (Device/Increase time)     General bed mobility comments: mod I with HOB 20 degrees    Transfers Overall transfer level: Needs assistance   Transfers: Sit to/from Stand Sit to Stand: Min guard         General transfer comment: cues for hand placement, safety and sequence. pt not reaching for surface to sit despite cues  Ambulation/Gait Ambulation/Gait assistance: Min assist Gait Distance (Feet): 150 Feet Assistive device: Rolling  walker (2 wheeled) Gait Pattern/deviations: Step-to pattern;Antalgic;Trunk flexed   Gait velocity interpretation: 1.31 - 2.62 ft/sec, indicative of limited community ambulator General Gait Details: pt with decreased stance LLE due to pain with antalgic gait and cues for sequence to off weight LLE. Pt determined gait distance and chair follow throughout but not needed   Stairs             Wheelchair Mobility    Modified Rankin (Stroke Patients Only)       Balance Overall balance assessment: Needs assistance   Sitting balance-Leahy Scale: Fair Sitting balance - Comments: EOB without assist   Standing balance support: Bilateral upper extremity supported Standing balance-Leahy Scale: Poor Standing balance comment: reliant on RW in standing                            Cognition Arousal/Alertness: Awake/alert Behavior During Therapy: WFL for tasks assessed/performed Overall Cognitive Status: Within Functional Limits for tasks assessed                                        Exercises General Exercises - Lower Extremity Long Arc Quad: AROM;Both;10 reps;Seated;15 reps Hip Flexion/Marching: AROM;Both;Seated;15 reps    General Comments        Pertinent Vitals/Pain Pain Score: 8  Pain Location: left ankle Pain Descriptors / Indicators: Aching;Grimacing;Guarding Pain Intervention(s): Limited activity within patient's tolerance;Repositioned;Monitored during session    Home Living  Prior Function            PT Goals (current goals can now be found in the care plan section) Acute Rehab PT Goals Time For Goal Achievement: 04/23/21 Potential to Achieve Goals: Good Progress towards PT goals: Goals met and updated - see care plan    Frequency    Min 3X/week      PT Plan Current plan remains appropriate    Co-evaluation              AM-PAC PT "6 Clicks" Mobility   Outcome Measure  Help needed  turning from your back to your side while in a flat bed without using bedrails?: None Help needed moving from lying on your back to sitting on the side of a flat bed without using bedrails?: None Help needed moving to and from a bed to a chair (including a wheelchair)?: A Little Help needed standing up from a chair using your arms (e.g., wheelchair or bedside chair)?: A Little Help needed to walk in hospital room?: A Little Help needed climbing 3-5 steps with a railing? : A Lot 6 Click Score: 19    End of Session Equipment Utilized During Treatment: Gait belt Activity Tolerance: Patient limited by pain Patient left: in chair;with call bell/phone within reach;with chair alarm set Nurse Communication: Mobility status PT Visit Diagnosis: Other abnormalities of gait and mobility (R26.89);Muscle weakness (generalized) (M62.81)     Time: 4458-4835 PT Time Calculation (min) (ACUTE ONLY): 26 min  Charges:  $Gait Training: 8-22 mins $Therapeutic Exercise: 8-22 mins                     [place], PT Acute Rehabilitation Services Pager: [phone] Office: [place] 04/09/2021, 8:38 AM

## 2021-04-09 NOTE — Progress Notes (Signed)
PROGRESS NOTE    Raymond [name]  KGM:[id number] DOB: 1955-08-18 DOA: 04/03/2021 PCP: [name], FNP    Brief Narrative:  65 year old gentleman with history of ESRD on hemodialysis, pancytopenia, chronic systolic heart failure, hypertension, coronary arteries, peripheral arterial disease, COPD, hepatitis C treated presented to emergency room on 9/24 with altered mental status.  A well check was requested by family member.  Patient was found at home unresponsive and hypoglycemic with blood sugars 22.  Subsequently experienced PEA arrest in the field.  CPR and ROSC achieved in 20 minutes of CPR.  He was hypothermic.  Intubated and admitted to ICU.  This is the third admission for this patient with hypoglycemia in the hospital. Admitted and discharged 6/3 with altered mental status and hypoglycemia Admitted and discharged 7/13 with altered mental status and hypoglycemia. This time with severe hypoglycemia and hypothermia causing cardiac arrest. C-peptide elevated.  Did not respond to octreotide.   Assessment & Plan:   Principal Problem:   Cardiac arrest [facility]) Active Problems:   ESRD (end stage renal disease) (HCC)   Hyperkalemia   Hypoglycemia   Pancytopenia (HCC)   Anisocoria   HFrEF (heart failure with reduced ejection fraction) (HCC)   Hypothermia not due to cold exposure   Junctional bradycardia  PEA cardiac arrest: Secondary to hypoglycemia and hypothermia.  Apparently was not eating well. Echocardiogram with ejection fraction 50 to 55%, left ventricular global hypokinesis, severe LVH. Seen by cardiology.  Recommended no further invasive testing. Resume beta-blockers.  Hypothermia secondary to hypoglycemia: Probably due to unable to eat. Remained on 10% dextrose to keep blood sugars up.  Now appetite has improved. Regular diet.  Encourage carbohydrate.  Keeping on oral dextrose replacement. C-peptide is elevated to 15.  Proinsulin pending.  MRI without contrast with no  evidence of pancreatic tumor.  Encourage oral intake, increase carbohydrate intake, scheduled oral dextrose and try to stop IV dextrose.   Acute metabolic encephalopathy, toxic metabolic encephalopathy with myoclonus: Due to hypoglycemia. Nonfocal.  Clinically improved.  Acute on chronic hypercapnic respiratory failure: Status post intubation and extubation.  Improved.  On room air today.  ESRD on hemodialysis: Followed by nephrology.  On dialysis schedule.  Paroxysmal A. fib: Rate controlled.  Sinus rhythm.  Patient on amiodarone and metoprolol.   Followed by cardiology.  Severe GI bleeding in the past, not on any anticoagulation. Lower extremity duplex negative for DVT. Resume beta-blockers today.  Already sinus rhythm, will discontinue amiodarone.  Chronic systolic heart failure: Patient on metoprolol.  Euvolemic.  Physical debility and deconditioning: Followed by PT OT.  Recommended CIR. Transfer to rehab when able to maintain his sugar levels without dextrose infusion.  Chronic GI bleed: Patient does have chronic intermittent GI bleed.  EGD, colonoscopy, capsule endoscopy completed within 6 months with evidence of AVM.  Will need repeat testing for any profuse rectal bleeding, otherwise symptomatic treatment.  Blood transfusion for hemoglobin less than 8. Not a candidate for anticoagulation.   DVT prophylaxis: SCDs Start: 04/03/21 0130   Code Status: Full code Family Communication: None Disposition Plan: Status is: Inpatient  Remains inpatient appropriate because:IV treatments appropriate due to intensity of illness or inability to take PO and Inpatient level of care appropriate due to severity of illness  Dispo: The patient is from: Home              Anticipated d/c is to: CIR              Patient currently is  not medically stable to d/c.   Difficult to place patient No         Consultants:  PCCM Cardiology Neurology  Procedures:  None none  Antimicrobials:   None   Subjective: Patient seen and examined.  No overnight events.  He did remain on dextrose.  He ate all his breakfast.  Continues to have pain on both knees, right shin.  Worked with physical therapy with improvement in mobility.  Mental status is normal. Will check x-rays of both knees and legs.  Objective: Vitals:   04/09/21 0800 04/09/21 0834 04/09/21 0900 04/09/21 1000  BP: (!) 124/113 (!) 180/93 (!) 168/102 (!) 158/98  Pulse: (!) 101 (!) 104 94 90  Resp: (!) 21  17 18   Temp:      TempSrc:      SpO2: 97% 94% 98% 99%  Weight:      Height:        Intake/Output Summary (Last 24 hours) at 04/09/2021 1104 Last data filed at 04/09/2021 0900 Gross per 24 hour  Intake 1265.06 ml  Output 2500 ml  Net -1234.94 ml   Filed Weights   04/07/21 0500 04/08/21 0351 04/08/21 1542  Weight: 77.8 kg 80.5 kg 79.8 kg    Examination:  General exam: Appears calm and comfortable .Chronically sick looking.  Debilitated.  Not in any distress. Respiratory system: Clear to auscultation. Respiratory effort normal.  No added sounds. Cardiovascular system: S1 & S2 heard, RRR.  2+ bilateral pedal edema.  Mild tender on examination.  No palpable fracture.  Left more than right. Gastrointestinal system: Abdomen is nondistended, soft and nontender. No organomegaly or masses felt. Normal bowel sounds heard. Central nervous system: Alert and oriented. No focal neurological deficits. Extremities: Symmetric 5 x 5 power. Skin: No rashes, lesions or ulcers Psychiatry: Judgement and insight appear normal. Mood & affect flat and anxious.    Data Reviewed: I have personally reviewed following labs and imaging studies  CBC: Recent Labs  Lab 04/03/21 0025 04/03/21 0046 04/04/21 0500 04/04/21 9675 04/05/21 1134 04/06/21 0801 04/07/21 0135 04/08/21 1418  WBC 1.5*   < > 3.2*  --  5.0 4.5 3.5* 3.5*  NEUTROABS 0.7*  --  2.5  --   --   --   --   --   HGB 11.6*   < > 11.1* 11.9* 9.7* 9.3* 9.1* 8.9*   HCT 39.0   < > 33.3* 35.0* 30.3* 28.5* 28.1* 28.1*  MCV 114.7*   < > 101.5*  --  102.0* 100.7* 101.4* 102.9*  PLT 62*   < > 75*  --  69* 75* 74* 87*   < > = values in this interval not displayed.   Basic Metabolic Panel: Recent Labs  Lab 04/03/21 0530 04/03/21 1059 04/03/21 1629 04/04/21 0500 04/04/21 0638 04/05/21 1134 04/06/21 0801 04/07/21 0135 04/08/21 1418  NA 135 140   < > 135 136 135 126* 129* 130*  K 5.6* 5.1   < > 3.3* 4.2 5.1 4.9 4.4 4.4  CL 96* 100   < > 91*  --  94* 89* 93* 92*  CO2 13* 16*   < > 27  --  24 22 25 24   GLUCOSE 721* 329*   < > 387*  --  92 102* 112* 101*  BUN 94* 104*   < > 51*  --  69* 75* 34* 52*  CREATININE 18.17* 19.63*   < > 10.60*  --  12.35* 12.53* 7.72* 10.05*  CALCIUM 9.1  9.0   < > 7.4*  --  8.4* 8.4* 8.4* 9.0  MG 2.3 2.5*  --  1.9  --  2.0 2.1  --   --   PHOS >30.0* >30.1*  --   --   --  8.6*  --   --  8.2*   < > = values in this interval not displayed.   GFR: Estimated Creatinine Clearance: 7.2 mL/min (A) (by C-G formula based on SCr of 10.05 mg/dL (H)). Liver Function Tests: Recent Labs  Lab 04/03/21 0025 04/04/21 0500 04/05/21 1134 04/08/21 1418  AST 29 19 18   --   ALT 18 18 20   --   ALKPHOS 55 42 51  --   BILITOT 0.7 0.9 1.0  --   PROT 7.1 5.4* 6.7  --   ALBUMIN 3.1* 2.2* 2.6* 2.5*   Recent Labs  Lab 04/03/21 0025  LIPASE 58*   No results for input(s): AMMONIA in the last 168 hours. Coagulation Profile: No results for input(s): INR, PROTIME in the last 168 hours. Cardiac Enzymes: Recent Labs  Lab 04/03/21 0530  CKTOTAL 815*  CKMB 18.0*   BNP (last 3 results) No results for input(s): PROBNP in the last 8760 hours. HbA1C: No results for input(s): HGBA1C in the last 72 hours. CBG: Recent Labs  Lab 04/08/21 1532 04/08/21 2000 04/08/21 2349 04/09/21 0322 04/09/21 0713  GLUCAP 94 92 114* 100* 84   Lipid Profile: No results for input(s): CHOL, HDL, LDLCALC, TRIG, CHOLHDL, LDLDIRECT in the last 72  hours. Thyroid Function Tests: No results for input(s): TSH, T4TOTAL, FREET4, T3FREE, THYROIDAB in the last 72 hours. Anemia Panel: No results for input(s): VITAMINB12, FOLATE, FERRITIN, TIBC, IRON, RETICCTPCT in the last 72 hours. Sepsis Labs: Recent Labs  Lab 04/03/21 0029 04/03/21 0530  LATICACIDVEN 6.1* 5.1*    Recent Results (from the past 240 hour(s))  Resp Panel by RT-PCR (Flu A&B, Covid) Nasopharyngeal Swab     Status: None   Collection Time: 04/03/21 12:26 AM   Specimen: Nasopharyngeal Swab; Nasopharyngeal(NP) swabs in vial transport medium  Result Value Ref Range Status   SARS Coronavirus 2 by RT PCR NEGATIVE NEGATIVE Final    Comment: (NOTE) SARS-CoV-2 target nucleic acids are NOT DETECTED.  The SARS-CoV-2 RNA is generally detectable in upper respiratory specimens during the acute phase of infection. The lowest concentration of SARS-CoV-2 viral copies this assay can detect is 138 copies/mL. A negative result does not preclude SARS-Cov-2 infection and should not be used as the sole basis for treatment or other patient management decisions. A negative result may occur with  improper specimen collection/handling, submission of specimen other than nasopharyngeal swab, presence of viral mutation(s) within the areas targeted by this assay, and inadequate number of viral copies(<138 copies/mL). A negative result must be combined with clinical observations, patient history, and epidemiological information. The expected result is Negative.  Fact Sheet for Patients:  EntrepreneurPulse.com.au  Fact Sheet for Healthcare Providers:  IncredibleEmployment.be  This test is no t yet approved or cleared by the [place] FDA and  has been authorized for detection and/or diagnosis of SARS-CoV-2 by FDA under an Emergency Use Authorization (EUA). This EUA will remain  in effect (meaning this test can be used) for the duration of the COVID-19  declaration under Section 564(b)(1) of the Act, 21 U.S.C.section 360bbb-3(b)(1), unless the authorization is terminated  or revoked sooner.       Influenza A by PCR NEGATIVE NEGATIVE Final   Influenza B by PCR  NEGATIVE NEGATIVE Final    Comment: (NOTE) The Xpert Xpress SARS-CoV-2/FLU/RSV plus assay is intended as an aid in the diagnosis of influenza from Nasopharyngeal swab specimens and should not be used as a sole basis for treatment. Nasal washings and aspirates are unacceptable for Xpert Xpress SARS-CoV-2/FLU/RSV testing.  Fact Sheet for Patients: EntrepreneurPulse.com.au  Fact Sheet for Healthcare Providers: IncredibleEmployment.be  This test is not yet approved or cleared by the [place] FDA and has been authorized for detection and/or diagnosis of SARS-CoV-2 by FDA under an Emergency Use Authorization (EUA). This EUA will remain in effect (meaning this test can be used) for the duration of the COVID-19 declaration under Section 564(b)(1) of the Act, 21 U.S.C. section 360bbb-3(b)(1), unless the authorization is terminated or revoked.  Performed at [facility], [place] [address], [place], [place] [address]   Culture, blood (routine x 2)     Status: None   Collection Time: 04/03/21  1:45 AM   Specimen: BLOOD RIGHT ARM  Result Value Ref Range Status   Specimen Description BLOOD RIGHT ARM  Final   Special Requests   Final    BOTTLES DRAWN AEROBIC AND ANAEROBIC Blood Culture adequate volume   Culture   Final    NO GROWTH 5 DAYS Performed at [facility], [address], [place], [place] [address]    Report Status 04/08/2021 FINAL  Final  MRSA Next Gen by PCR, Nasal     Status: None   Collection Time: 04/03/21  2:41 AM   Specimen: Nasal Mucosa; Nasal Swab  Result Value Ref Range Status   MRSA by PCR Next Gen NOT DETECTED NOT DETECTED Final    Comment: (NOTE) The GeneXpert MRSA Assay (FDA approved for NASAL specimens  only), is one component of a comprehensive MRSA colonization surveillance program. It is not intended to diagnose MRSA infection nor to guide or monitor treatment for MRSA infections. Test performance is not FDA approved in patients less than 3 years old. Performed at [facility], [place] [address], [place], [place] [address]   Culture, blood (routine x 2)     Status: None   Collection Time: 04/03/21  5:41 AM   Specimen: BLOOD RIGHT HAND  Result Value Ref Range Status   Specimen Description BLOOD RIGHT HAND  Final   Special Requests AEROBIC BOTTLE ONLY Blood Culture adequate volume  Final   Culture   Final    NO GROWTH 5 DAYS Performed at [facility], [place] [address] [place], [place] [address]    Report Status 04/08/2021 FINAL  Final         Radiology Studies: MR ABDOMEN WO CONTRAST  Result Date: 04/08/2021 CLINICAL DATA:  Gastrointestinal cancer. Restaging. Rule out pancreatic tumor. EXAM: MRI ABDOMEN WITHOUT CONTRAST TECHNIQUE: Multiplanar multisequence MR imaging was performed without the administration of intravenous contrast. COMPARISON:  CT scan 01/15/2020 FINDINGS: Examination is quite limited due to respiratory motion. Lower chest: The lung bases are grossly clear. No pleural or pericardial effusion. Stable cardiac enlargement. Hepatobiliary: Changes of iron deposition disease. The liver and spleen demonstrate extremely low T2 signal intensity and decreased T1 signal intensity. No obvious hepatic lesions or intrahepatic biliary dilatation. Mild uniform gallbladder wall thickening but no gallstones. Normal caliber and course of the common bile duct. Pancreas: No pancreatic mass is identified. No evidence of acute inflammatory process. Spleen: Normal size. Very low T2 signal intensity consistent with iron deposition disease. Adrenals/Urinary Tract: Small kidneys with innumerable cysts  likely related to hemodialysis. Some of these demonstrate increased T1 signal intensity  suggesting hemorrhagic cyst. I do not see an obvious worrisome renal lesion. The adrenal glands are normal. Stomach/Bowel: The stomach, duodenum, visualized small bowel and visualized colon are grossly normal. Vascular/Lymphatic: No aortic aneurysm. Advanced atherosclerotic vascular disease. No mesenteric or retroperitoneal mass or adenopathy. Other:  Small amount of free abdominal and free pelvic fluid. Musculoskeletal: No significant bony findings. IMPRESSION: 1. Very limited examination due to respiratory motion. 2. No evidence of pancreatic mass or acute inflammatory process. 3. 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 4. Small amount of free abdominal and free pelvic fluid. 5. Small kidneys with innumerable cysts likely related to hemodialysis. Electronically Signed   By: Marijo Sanes M.D.   On: 04/08/2021 16:24        Scheduled Meds:  budesonide (PULMICORT) nebulizer solution  0.25 mg Nebulization BID   Chlorhexidine Gluconate Cloth  6 each Topical Q0600   cinacalcet  180 mg Oral Q T,Th,Sa-HD   dextrose  1 Tube Oral QID   feeding supplement (NEPRO CARB STEADY)  237 mL Oral BID BM   folic acid  1 mg Oral Daily   ipratropium-albuterol  3 mL Nebulization BID   metoprolol tartrate  50 mg Oral BID   multivitamin  1 tablet Oral QHS   pantoprazole  40 mg Oral QHS   sevelamer carbonate  3,200 mg Oral TID WC   thiamine  100 mg Oral Daily   Continuous Infusions:  sodium chloride Stopped (04/05/21 2244)   dextrose Stopped (04/09/21 0855)     LOS: 6 days    Time spent: 30 minutes.    Barb Merino, MD [facility] Pager [phone]

## 2021-04-09 NOTE — Progress Notes (Signed)
Patient ID: Raymond [name], male   DOB: [birth year], 65 y.o.   MRN: [id number] [facility] Progress Note   Assessment/ Plan:   1.  Acute encephalopathy: Status postcardiac arrest with likely element of hypoxic/anoxic brain injury possibly compounded by hypoglycemia. Mental status improved and appears back to baseline on my assessment. MRI abdomen was negative for pancreatic tumor but remains hypoglycemic.  2. ESRD: Order for hemodialysis tomorrow to continue TTS schedule; no volume excess noted.  3. Anemia: Hemoglobin and hematocrit at acceptable range and without indications for ESA at this time.  No overt blood loss noted. 4. CKD-MBD: Significantly elevated phosphorus levels noted-likely with poor adherence to outpatient binders/diet.  Binders/sensipar restarted.  5. Nutrition: Remains on D10W drip. Without critical hypoglycemia noted overnight- oral intake improving.     6. Hypertension: Blood pressure currently at goal, continue to follow.  Subjective:   Denies any acute events overnight and tolerated hemodialysis without problems.    Objective:   BP (!) 120/101   Pulse 87   Temp 98.9 F (37.2 C) (Oral)   Resp 17   Ht 5\' 7"  (1.702 m)   Wt 79.8 kg   SpO2 98%   BMI 27.55 kg/m   Physical Exam: Gen: Sitting up comfortably in recliner- working with PT, awake/alert CVS: Pulse regular rhythm, normal rate, S1 and S2 normal Resp: Clear to auscultation bilaterally, no rales/rhonchi Abd: Soft, flat, nontender, bowel sounds normal Ext: Trace left ankle edema, no right lower extremity edema.  Left upper arm AV fistula-pulsatile  Labs: BMET Recent Labs  Lab 04/03/21 0530 04/03/21 1059 04/03/21 1629 04/03/21 2100 04/04/21 0113 04/04/21 0500 04/04/21 9326 04/05/21 1134 04/06/21 0801 04/07/21 0135 04/08/21 1418  NA 135 140   < > 141 137 135 136 135 126* 129* 130*  K 5.6* 5.1   < > 5.1 3.0* 3.3* 4.2 5.1 4.9 4.4 4.4  CL 96* 100   < > 102 99 91*  --  94* 89* 93* 92*   CO2 13* 16*   < > 19* 24 27  --  24 22 25 24   GLUCOSE 721* 329*   < > 173* 126* 387*  --  92 102* 112* 101*  BUN 94* 104*   < > 115* 39* 51*  --  69* 75* 34* 52*  CREATININE 18.17* 19.63*   < > 20.00* 8.18* 10.60*  --  12.35* 12.53* 7.72* 10.05*  CALCIUM 9.1 9.0   < > 8.9 9.0 7.4*  --  8.4* 8.4* 8.4* 9.0  PHOS >30.0* >30.1*  --   --   --   --   --  8.6*  --   --  8.2*   < > = values in this interval not displayed.   CBC Recent Labs  Lab 04/03/21 0025 04/03/21 0046 04/04/21 0500 04/04/21 0638 04/05/21 1134 04/06/21 0801 04/07/21 0135 04/08/21 1418  WBC 1.5*   < > 3.2*  --  5.0 4.5 3.5* 3.5*  NEUTROABS 0.7*  --  2.5  --   --   --   --   --   HGB 11.6*   < > 11.1*   < > 9.7* 9.3* 9.1* 8.9*  HCT 39.0   < > 33.3*   < > 30.3* 28.5* 28.1* 28.1*  MCV 114.7*   < > 101.5*  --  102.0* 100.7* 101.4* 102.9*  PLT 62*   < > 75*  --  69* 75* 74* 87*   < > = values in  this interval not displayed.     Medications:     budesonide (PULMICORT) nebulizer solution  0.25 mg Nebulization BID   Chlorhexidine Gluconate Cloth  6 each Topical Q0600   cinacalcet  180 mg Oral Q T,Th,Sa-HD   dextrose  1 Tube Oral QID   feeding supplement (NEPRO CARB STEADY)  237 mL Oral BID BM   folic acid  1 mg Oral Daily   ipratropium-albuterol  3 mL Nebulization BID   multivitamin  1 tablet Oral QHS   pantoprazole  40 mg Oral QHS   sevelamer carbonate  3,200 mg Oral TID WC   thiamine  100 mg Oral Daily   Elmarie Shiley, MD 04/09/2021, 8:24 AM

## 2021-04-09 NOTE — Progress Notes (Signed)
Progress Note  Patient Name: Raymond [name] Date of Encounter: 04/09/2021  Primary Cardiologist: Jenkins Rouge, MD   Subjective   No chest pain.  Inpatient Medications    Scheduled Meds:  budesonide (PULMICORT) nebulizer solution  0.25 mg Nebulization BID   Chlorhexidine Gluconate Cloth  6 each Topical Q0600   cinacalcet  180 mg Oral Q T,Th,Sa-HD   dextrose  1 Tube Oral QID   feeding supplement (NEPRO CARB STEADY)  237 mL Oral BID BM   folic acid  1 mg Oral Daily   ipratropium-albuterol  3 mL Nebulization BID   multivitamin  1 tablet Oral QHS   pantoprazole  40 mg Oral QHS   sevelamer carbonate  3,200 mg Oral TID WC   thiamine  100 mg Oral Daily   Continuous Infusions:  sodium chloride Stopped (04/05/21 2244)   dextrose Stopped (04/09/21 0855)   PRN Meds: sodium chloride, calcium chloride, docusate sodium, hydrALAZINE, oxyCODONE, polyethylene glycol, traZODone   Vital Signs    Vitals:   04/09/21 0715 04/09/21 0800 04/09/21 0834 04/09/21 0900  BP:  (!) 124/113 (!) 180/93 (!) 168/102  Pulse:  (!) 101 (!) 104 94  Resp:  (!) 21  17  Temp: 98.9 F (37.2 C)     TempSrc: Oral     SpO2:  97% 94% 98%  Weight:      Height:        Intake/Output Summary (Last 24 hours) at 04/09/2021 1007 Last data filed at 04/09/2021 0900 Gross per 24 hour  Intake 1340.01 ml  Output 2500 ml  Net -1159.99 ml   Filed Weights   04/07/21 0500 04/08/21 0351 04/08/21 1542  Weight: 77.8 kg 80.5 kg 79.8 kg    Telemetry    SR - Personally Reviewed  ECG    SR - Personally Reviewed  Physical Exam   GEN: No acute distress.   Neck: No JVD Cardiac: RRR, no murmurs, rubs, or gallops.  Respiratory: Clear to auscultation bilaterally. GI: Soft, nontender, non-distended  MS: No edema; No deformity. Neuro:  Nonfocal  Psych: Normal affect    Labs    Chemistry Recent Labs  Lab 04/03/21 0025 04/03/21 0046 04/04/21 0500 04/04/21 2841 04/05/21 1134 04/06/21 0801 04/07/21 0135  04/08/21 1418  NA 142   < > 135   < > 135 126* 129* 130*  K 5.8*   < > 3.3*   < > 5.1 4.9 4.4 4.4  CL 103   < > 91*  --  94* 89* 93* 92*  CO2 14*   < > 27  --  24 22 25 24   GLUCOSE 103*   < > 387*  --  92 102* 112* 101*  BUN 100*   < > 51*  --  69* 75* 34* 52*  CREATININE 20.48*   < > 10.60*  --  12.35* 12.53* 7.72* 10.05*  CALCIUM 9.3   < > 7.4*  --  8.4* 8.4* 8.4* 9.0  PROT 7.1  --  5.4*  --  6.7  --   --   --   ALBUMIN 3.1*  --  2.2*  --  2.6*  --   --  2.5*  AST 29  --  19  --  18  --   --   --   ALT 18  --  18  --  20  --   --   --   ALKPHOS 55  --  42  --  51  --   --   --  BILITOT 0.7  --  0.9  --  1.0  --   --   --   GFRNONAA 2*   < > 5*  --  4* 4* 7* 5*  ANIONGAP 25*   < > 17*  --  17* 15 11 14    < > = values in this interval not displayed.     Hematology Recent Labs  Lab 04/06/21 0801 04/07/21 0135 04/08/21 1418  WBC 4.5 3.5* 3.5*  RBC 2.83* 2.77* 2.73*  HGB 9.3* 9.1* 8.9*  HCT 28.5* 28.1* 28.1*  MCV 100.7* 101.4* 102.9*  MCH 32.9 32.9 32.6  MCHC 32.6 32.4 31.7  RDW 17.4* 17.3* 17.5*  PLT 75* 74* 87*    Cardiac EnzymesNo results for input(s): TROPONINI in the last 168 hours. No results for input(s): TROPIPOC in the last 168 hours.   BNPNo results for input(s): BNP, PROBNP in the last 168 hours.   DDimer No results for input(s): DDIMER in the last 168 hours.   Radiology    MR ABDOMEN WO CONTRAST  Result Date: 04/08/2021 CLINICAL DATA:  Gastrointestinal cancer. Restaging. Rule out pancreatic tumor. EXAM: MRI ABDOMEN WITHOUT CONTRAST TECHNIQUE: Multiplanar multisequence MR imaging was performed without the administration of intravenous contrast. COMPARISON:  CT scan 01/15/2020 FINDINGS: Examination is quite limited due to respiratory motion. Lower chest: The lung bases are grossly clear. No pleural or pericardial effusion. Stable cardiac enlargement. Hepatobiliary: Changes of iron deposition disease. The liver and spleen demonstrate extremely low T2 signal  intensity and decreased T1 signal intensity. No obvious hepatic lesions or intrahepatic biliary dilatation. Mild uniform gallbladder wall thickening but no gallstones. Normal caliber and course of the common bile duct. Pancreas: No pancreatic mass is identified. No evidence of acute inflammatory process. Spleen: Normal size. Very low T2 signal intensity consistent with iron deposition disease. Adrenals/Urinary Tract: Small kidneys with innumerable cysts likely related to hemodialysis. Some of these demonstrate increased T1 signal intensity suggesting hemorrhagic cyst. I do not see an obvious worrisome renal lesion. The adrenal glands are normal. Stomach/Bowel: The stomach, duodenum, visualized small bowel and visualized colon are grossly normal. Vascular/Lymphatic: No aortic aneurysm. Advanced atherosclerotic vascular disease. No mesenteric or retroperitoneal mass or adenopathy. Other:  Small amount of free abdominal and free pelvic fluid. Musculoskeletal: No significant bony findings. IMPRESSION: 1. Very limited examination due to respiratory motion. 2. No evidence of pancreatic mass or acute inflammatory process. 3. 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 4. Small amount of free abdominal and free pelvic fluid. 5. Small kidneys with innumerable cysts likely related to hemodialysis. Electronically Signed   By: Marijo Sanes M.D.   On: 04/08/2021 16:24    Cardiac Studies   Echo EF 50-55%, normal strain. RV normal. Moderate biatrial enlargement, RVSP 56 mmHg. Moderate TR, trivial MR. AV sclerosis. Aorta dilation, 45 mm aortic aneurysm at ascending aorta.  Patient Profile     65 y.o. male with COPD, end-stage renal disease on intermittent hemodialysis, HCV, hypertension, substance abuse, thyroid disease, diabetes mellitus type 2, and mild CAD with elevated troponin and PEA arrest with hypoglycemia and hypothermia.  Assessment & Plan   Principal Problem:   Cardiac arrest  [facility]) Active Problems:   ESRD (end stage renal disease) (HCC)   Hyperkalemia   Hypoglycemia   Pancytopenia (HCC)   Anisocoria   HFrEF (heart failure with reduced ejection fraction) (HCC)   Hypothermia not due to cold exposure   Junctional bradycardia   Telemetry today shows sinus rhythm.  No atrial arrhythmia. Can resume home metoprolol prior to discharge- he has not received this while in hospital. With history of severe GI bleed per documentation, would have patient discuss resuming anticoagulation risks and benefits with his primary cardiologist at outpatient follow up. We will arrange this follow up. May be best to simplify his medications given concerns of outpatient compliance with therapy and diets, can hold amiodarone until follow up with primary cardiologist as well for discussion on risks and benefits.   Troponin elevation thought secondary to cardiac arrest in the setting of end-stage renal disease.  Dilated aorta to be followed as an outpatient.  Cardiology will sign off at this time, do not hesistate to contact our service with questions or concerns.     For questions or updates, please contact [place] Please consult [email or web address] for contact info under        Signed, Elouise Munroe, MD  04/09/2021, 10:07 AM

## 2021-04-09 NOTE — Plan of Care (Signed)
  Problem: Education: Goal: Knowledge of General Education information will improve Description: Including pain rating scale, medication(s)/side effects and non-pharmacologic comfort measures Outcome: Progressing   Problem: Health Behavior/Discharge Planning: Goal: Ability to manage health-related needs will improve Outcome: Progressing   Problem: Clinical Measurements: Goal: Ability to maintain clinical measurements within normal limits will improve Outcome: Progressing   Problem: Clinical Measurements: Goal: Diagnostic test results will improve Outcome: Progressing   Problem: Clinical Measurements: Goal: Cardiovascular complication will be avoided Outcome: Progressing   Problem: Clinical Measurements: Goal: Respiratory complications will improve Outcome: Progressing   Problem: Activity: Goal: Risk for activity intolerance will decrease Outcome: Progressing   Problem: Nutrition: Goal: Adequate nutrition will be maintained Outcome: Progressing   Problem: Pain Managment: Goal: General experience of comfort will improve Outcome: Progressing   Problem: Safety: Goal: Ability to remain free from injury will improve Outcome: Progressing   Problem: Skin Integrity: Goal: Risk for impaired skin integrity will decrease Outcome: Progressing

## 2021-04-09 NOTE — Progress Notes (Addendum)
Hypoglycemic Event  CBG: 57  Treatment: 4 oz juice/soda, graham crackers, popsicle   Symptoms: None  Follow-up CBG: Time:1130 CBG Result:50  Possible Reasons for Event: Pt having ongoing issues regulating his blood sugar. D10 infusion was d/c'd after he ate a full breakfast and had glucose packet as scheduled.   Comments/MD notified: Ghimire Order to draw serum glucose to confirm, suspect poor circulation    Raymond [name]

## 2021-04-10 LAB — RENAL FUNCTION PANEL
Albumin: 2.6 g/dL — ABNORMAL LOW (ref 3.5–5.0)
Anion gap: 9 (ref 5–15)
BUN: 32 mg/dL — ABNORMAL HIGH (ref 8–23)
CO2: 27 mmol/L (ref 22–32)
Calcium: 9.5 mg/dL (ref 8.9–10.3)
Chloride: 97 mmol/L — ABNORMAL LOW (ref 98–111)
Creatinine, Ser: 8.07 mg/dL — ABNORMAL HIGH (ref 0.61–1.24)
GFR, Estimated: 7 mL/min — ABNORMAL LOW (ref >60)
Glucose, Bld: 90 mg/dL (ref 70–99)
Phosphorus: 5.5 mg/dL — ABNORMAL HIGH (ref 2.5–4.6)
Potassium: 3.9 mmol/L (ref 3.5–5.1)
Sodium: 133 mmol/L — ABNORMAL LOW (ref 135–145)

## 2021-04-10 LAB — CBC
HCT: 27.4 % — ABNORMAL LOW (ref 39.0–52.0)
Hemoglobin: 8.7 g/dL — ABNORMAL LOW (ref 13.0–17.0)
MCH: 32.8 pg (ref 26.0–34.0)
MCHC: 31.8 g/dL (ref 30.0–36.0)
MCV: 103.4 fL — ABNORMAL HIGH (ref 80.0–100.0)
Platelets: 112 10*3/uL — ABNORMAL LOW (ref 150–400)
RBC: 2.65 MIL/uL — ABNORMAL LOW (ref 4.22–5.81)
RDW: 17.3 % — ABNORMAL HIGH (ref 11.5–15.5)
WBC: 2.9 10*3/uL — ABNORMAL LOW (ref 4.0–10.5)
nRBC: 0 % (ref 0.0–0.2)

## 2021-04-10 LAB — GLUCOSE, CAPILLARY
Glucose-Capillary: 85 mg/dL (ref 70–99)
Glucose-Capillary: 84 mg/dL (ref 70–99)
Glucose-Capillary: 75 mg/dL (ref 70–99)
Glucose-Capillary: 91 mg/dL (ref 70–99)

## 2021-04-10 LAB — GLUCOSE, RANDOM: Glucose, Bld: 88 mg/dL (ref 70–99)

## 2021-04-10 MED ORDER — SODIUM CHLORIDE 0.9 % IV SOLN
100.0000 mg | INTRAVENOUS | Status: DC
  Administered 2021-04-10: 100 mg via INTRAVENOUS
  Filled 2021-04-10: qty 5

## 2021-04-10 MED ORDER — IPRATROPIUM-ALBUTEROL 0.5-2.5 (3) MG/3ML IN SOLN
3.0000 mL | RESPIRATORY_TRACT | Status: DC | PRN
  Administered 2021-04-11: 3 mL via RESPIRATORY_TRACT
  Filled 2021-04-10: qty 3

## 2021-04-10 NOTE — Progress Notes (Signed)
PROGRESS NOTE    Raymond [name]  XLK:[id number] DOB: 05/26/1956 DOA: 04/03/2021 PCP: [name], FNP    Brief Narrative:  65 year old gentleman with history of ESRD on hemodialysis, pancytopenia, chronic systolic heart failure, hypertension, coronary arteries, peripheral arterial disease, COPD, hepatitis C treated presented to emergency room on 9/24 with altered mental status.  A well check was requested by family member.  Patient was found at home unresponsive and hypoglycemic with blood sugars 22.  Subsequently experienced PEA arrest in the field.  CPR and ROSC achieved in 20 minutes of CPR.  He was hypothermic.  Intubated and admitted to ICU.  This is the third admission for this patient with hypoglycemia in the hospital. Admitted and discharged 6/3 with altered mental status and hypoglycemia Admitted and discharged 7/13 with altered mental status and hypoglycemia. This time with severe hypoglycemia and hypothermia causing cardiac arrest. C-peptide elevated.  Did not respond to octreotide.  Insulin and pro insulin level is normal.   Assessment & Plan:   Principal Problem:   Cardiac arrest ([place]) Active Problems:   ESRD (end stage renal disease) (HCC)   Hyperkalemia   Hypoglycemia   Pancytopenia (HCC)   Anisocoria   HFrEF (heart failure with reduced ejection fraction) (HCC)   Hypothermia not due to cold exposure   Junctional bradycardia  PEA cardiac arrest: Secondary to hypoglycemia and hypothermia.  Apparently was not eating well. Echocardiogram with ejection fraction 50 to 55%, left ventricular global hypokinesis, severe LVH. Seen by cardiology.  Recommended no further invasive testing. Resumed beta-blockers.  Hypothermia secondary to hypoglycemia: Probably due to poor oral intake and absorption. C-peptide elevated.  Insulin and proinsulin levels were normal.  With normal insulin level and normal MRI of the pancreas, suspicion of insulin secreting tumor is low. Treated  with IV dextrose.  Now appetite has improved and eating regular diet. Encourage oral intake, increase carbohydrate intake, scheduled oral dextrose . IV dextrose only for symptomatic hypoglycemia. Very low blood sugars to be verified with serum blood sugars.  So far stable.  Acute metabolic encephalopathy, toxic metabolic encephalopathy with myoclonus: Due to hypoglycemia. Nonfocal.  Clinically improved.  Acute on chronic hypercapnic respiratory failure: Status post intubation and extubation.  Improved.  On room air today.  ESRD on hemodialysis: Followed by nephrology.  On dialysis schedule.  Paroxysmal A. fib: Rate controlled.  Sinus rhythm.  Patient on amiodarone and metoprolol.   Followed by cardiology.  Severe GI bleeding in the past, not on any anticoagulation. Lower extremity duplex negative for DVT. On beta-blockers now.  Already sinus rhythm, amiodarone discontinued.  Chronic systolic heart failure: Patient on metoprolol.  Euvolemic.  Physical debility and deconditioning: Followed by PT OT.  Recommended CIR. Stable for transfer.  Chronic GI bleed: Patient does have chronic intermittent GI bleed.  EGD, colonoscopy, capsule endoscopy completed within 6 months with evidence of AVM.  Will need repeat testing for any profuse rectal bleeding, otherwise symptomatic treatment.  Blood transfusion for hemoglobin less than 8. Not a candidate for anticoagulation.   DVT prophylaxis: SCDs Start: 04/03/21 0130   Code Status: Full code Family Communication: None Disposition Plan: Status is: Inpatient  Remains inpatient appropriate because:IV treatments appropriate due to intensity of illness or inability to take PO and Inpatient level of care appropriate due to severity of illness  Dispo: The patient is from: Home              Anticipated d/c is to: CIR  Patient currently is currently medically stable for rehab level of care.   Difficult to place patient No          Consultants:  PCCM Cardiology Neurology  Procedures:  None none  Antimicrobials:  None   Subjective: Patient seen and examined.  No overnight events.  Still has pain on his both knees and legs.  X-ray showed arthritic changes.  Up for dialysis today. Off dextrose drip for the last 24 hours, multiple serum glucose levels are more than 80.  Objective: Vitals:   04/10/21 1000 04/10/21 1030 04/10/21 1100 04/10/21 1130  BP: (!) 169/83 (!) 165/81 (!) 155/100 (!) 143/75  Pulse: 87     Resp: (!) 21 19 18 16   Temp:      TempSrc:      SpO2: 97%     Weight:      Height:        Intake/Output Summary (Last 24 hours) at 04/10/2021 1218 Last data filed at 04/10/2021 0414 Gross per 24 hour  Intake 242.46 ml  Output 0 ml  Net 242.46 ml   Filed Weights   04/08/21 1542 04/10/21 0740 04/10/21 0910  Weight: 79.8 kg 77.8 kg 77.6 kg    Examination:  General exam: Appears calm and comfortable .Chronically sick looking.  Debilitated.  Not in any distress. Respiratory system: Clear to auscultation. Respiratory effort normal.  No added sounds. Cardiovascular system: S1 & S2 heard, RRR.  Chronic bilateral pedal edema and some tenderness.  Mild tender on examination.  No palpable fracture.  Left more than right. Gastrointestinal system: Abdomen is nondistended, soft and nontender. No organomegaly or masses felt. Normal bowel sounds heard. Central nervous system: Alert and oriented. No focal neurological deficits. Extremities: Symmetric 5 x 5 power. Skin: No rashes, lesions or ulcers Psychiatry: Judgement and insight appear normal. Mood & affect flat and anxious.    Data Reviewed: I have personally reviewed following labs and imaging studies  CBC: Recent Labs  Lab 04/04/21 0500 04/04/21 0638 04/05/21 1134 04/06/21 0801 04/07/21 0135 04/08/21 1418 04/10/21 0819  WBC 3.2*  --  5.0 4.5 3.5* 3.5* 2.9*  NEUTROABS 2.5  --   --   --   --   --   --   HGB 11.1*   < > 9.7* 9.3* 9.1*  8.9* 8.7*  HCT 33.3*   < > 30.3* 28.5* 28.1* 28.1* 27.4*  MCV 101.5*  --  102.0* 100.7* 101.4* 102.9* 103.4*  PLT 75*  --  69* 75* 74* 87* 112*   < > = values in this interval not displayed.   Basic Metabolic Panel: Recent Labs  Lab 04/04/21 0500 04/04/21 0638 04/05/21 1134 04/06/21 0801 04/07/21 0135 04/08/21 1418 04/09/21 1200 04/09/21 1611 04/09/21 2023 04/10/21 0405 04/10/21 0819  NA 135   < > 135 126* 129* 130*  --   --   --   --  133*  K 3.3*   < > 5.1 4.9 4.4 4.4  --   --   --   --  3.9  CL 91*  --  94* 89* 93* 92*  --   --   --   --  97*  CO2 27  --  24 22 25 24   --   --   --   --  27  GLUCOSE 387*  --  92 102* 112* 101* 90 77 107* 88 90  BUN 51*  --  69* 75* 34* 52*  --   --   --   --  32*  CREATININE 10.60*  --  12.35* 12.53* 7.72* 10.05*  --   --   --   --  8.07*  CALCIUM 7.4*  --  8.4* 8.4* 8.4* 9.0  --   --   --   --  9.5  MG 1.9  --  2.0 2.1  --   --   --   --   --   --   --   PHOS  --   --  8.6*  --   --  8.2*  --   --   --   --  5.5*   < > = values in this interval not displayed.   GFR: Estimated Creatinine Clearance: 8.3 mL/min (A) (by C-G formula based on SCr of 8.07 mg/dL (H)). Liver Function Tests: Recent Labs  Lab 04/04/21 0500 04/05/21 1134 04/08/21 1418 04/10/21 0819  AST 19 18  --   --   ALT 18 20  --   --   ALKPHOS 42 51  --   --   BILITOT 0.9 1.0  --   --   PROT 5.4* 6.7  --   --   ALBUMIN 2.2* 2.6* 2.5* 2.6*   No results for input(s): LIPASE, AMYLASE in the last 168 hours.  No results for input(s): AMMONIA in the last 168 hours. Coagulation Profile: No results for input(s): INR, PROTIME in the last 168 hours. Cardiac Enzymes: No results for input(s): CKTOTAL, CKMB, CKMBINDEX, TROPONINI in the last 168 hours.  BNP (last 3 results) No results for input(s): PROBNP in the last 8760 hours. HbA1C: No results for input(s): HGBA1C in the last 72 hours. CBG: Recent Labs  Lab 04/09/21 0713 04/09/21 1106 04/09/21 1129 04/10/21 0103  04/10/21 0807  GLUCAP 84 57* 50* 85 84   Lipid Profile: No results for input(s): CHOL, HDL, LDLCALC, TRIG, CHOLHDL, LDLDIRECT in the last 72 hours. Thyroid Function Tests: No results for input(s): TSH, T4TOTAL, FREET4, T3FREE, THYROIDAB in the last 72 hours. Anemia Panel: No results for input(s): VITAMINB12, FOLATE, FERRITIN, TIBC, IRON, RETICCTPCT in the last 72 hours. Sepsis Labs: No results for input(s): PROCALCITON, LATICACIDVEN in the last 168 hours.   Recent Results (from the past 240 hour(s))  Resp Panel by RT-PCR (Flu A&B, Covid) Nasopharyngeal Swab     Status: None   Collection Time: 04/03/21 12:26 AM   Specimen: Nasopharyngeal Swab; Nasopharyngeal(NP) swabs in vial transport medium  Result Value Ref Range Status   SARS Coronavirus 2 by RT PCR NEGATIVE NEGATIVE Final    Comment: (NOTE) SARS-CoV-2 target nucleic acids are NOT DETECTED.  The SARS-CoV-2 RNA is generally detectable in upper respiratory specimens during the acute phase of infection. The lowest concentration of SARS-CoV-2 viral copies this assay can detect is 138 copies/mL. A negative result does not preclude SARS-Cov-2 infection and should not be used as the sole basis for treatment or other patient management decisions. A negative result may occur with  improper specimen collection/handling, submission of specimen other than nasopharyngeal swab, presence of viral mutation(s) within the areas targeted by this assay, and inadequate number of viral copies(<138 copies/mL). A negative result must be combined with clinical observations, patient history, and epidemiological information. The expected result is Negative.  Fact Sheet for Patients:  EntrepreneurPulse.com.au  Fact Sheet for Healthcare Providers:  IncredibleEmployment.be  This test is no t yet approved or cleared by the [place] FDA and  has been authorized for detection and/or diagnosis of SARS-CoV-2 by FDA  under an  Emergency Use Authorization (EUA). This EUA will remain  in effect (meaning this test can be used) for the duration of the COVID-19 declaration under Section 564(b)(1) of the Act, 21 U.S.C.section 360bbb-3(b)(1), unless the authorization is terminated  or revoked sooner.       Influenza A by PCR NEGATIVE NEGATIVE Final   Influenza B by PCR NEGATIVE NEGATIVE Final    Comment: (NOTE) The Xpert Xpress SARS-CoV-2/FLU/RSV plus assay is intended as an aid in the diagnosis of influenza from Nasopharyngeal swab specimens and should not be used as a sole basis for treatment. Nasal washings and aspirates are unacceptable for Xpert Xpress SARS-CoV-2/FLU/RSV testing.  Fact Sheet for Patients: EntrepreneurPulse.com.au  Fact Sheet for Healthcare Providers: IncredibleEmployment.be  This test is not yet approved or cleared by the [place] FDA and has been authorized for detection and/or diagnosis of SARS-CoV-2 by FDA under an Emergency Use Authorization (EUA). This EUA will remain in effect (meaning this test can be used) for the duration of the COVID-19 declaration under Section 564(b)(1) of the Act, 21 U.S.C. section 360bbb-3(b)(1), unless the authorization is terminated or revoked.  Performed at [facility], [place] [address] [place], [place] [address]   Culture, blood (routine x 2)     Status: None   Collection Time: 04/03/21  1:45 AM   Specimen: BLOOD RIGHT ARM  Result Value Ref Range Status   Specimen Description BLOOD RIGHT ARM  Final   Special Requests   Final    BOTTLES DRAWN AEROBIC AND ANAEROBIC Blood Culture adequate volume   Culture   Final    NO GROWTH 5 DAYS Performed at [facility], [address], [place], [place] [address]    Report Status 04/08/2021 FINAL  Final  MRSA Next Gen by PCR, Nasal     Status: None   Collection Time: 04/03/21  2:41 AM   Specimen: Nasal Mucosa; Nasal Swab  Result Value Ref Range  Status   MRSA by PCR Next Gen NOT DETECTED NOT DETECTED Final    Comment: (NOTE) The GeneXpert MRSA Assay (FDA approved for NASAL specimens only), is one component of a comprehensive MRSA colonization surveillance program. It is not intended to diagnose MRSA infection nor to guide or monitor treatment for MRSA infections. Test performance is not FDA approved in patients less than 52 years old. Performed at [facility], [place] [address], [place], [place] [address]   Culture, blood (routine x 2)     Status: None   Collection Time: 04/03/21  5:41 AM   Specimen: BLOOD RIGHT HAND  Result Value Ref Range Status   Specimen Description BLOOD RIGHT HAND  Final   Special Requests AEROBIC BOTTLE ONLY Blood Culture adequate volume  Final   Culture   Final    NO GROWTH 5 DAYS Performed at [facility], [place] [address], [place], [place] [address]    Report Status 04/08/2021 FINAL  Final         Radiology Studies: MR ABDOMEN WO CONTRAST  Result Date: 04/08/2021 CLINICAL DATA:  Gastrointestinal cancer. Restaging. Rule out pancreatic tumor. EXAM: MRI ABDOMEN WITHOUT CONTRAST TECHNIQUE: Multiplanar multisequence MR imaging was performed without the administration of intravenous contrast. COMPARISON:  CT scan 01/15/2020 FINDINGS: Examination is quite limited due to respiratory motion. Lower chest: The lung bases are grossly clear. No pleural or pericardial effusion. Stable cardiac enlargement. Hepatobiliary: Changes of iron deposition disease. The liver and spleen demonstrate extremely low T2 signal intensity and  decreased T1 signal intensity. No obvious hepatic lesions or intrahepatic biliary dilatation. Mild uniform gallbladder wall thickening but no gallstones. Normal caliber and course of the common bile duct. Pancreas: No pancreatic mass is identified. No evidence of acute inflammatory process. Spleen: Normal size. Very low T2 signal intensity consistent with iron deposition disease.  Adrenals/Urinary Tract: Small kidneys with innumerable cysts likely related to hemodialysis. Some of these demonstrate increased T1 signal intensity suggesting hemorrhagic cyst. I do not see an obvious worrisome renal lesion. The adrenal glands are normal. Stomach/Bowel: The stomach, duodenum, visualized small bowel and visualized colon are grossly normal. Vascular/Lymphatic: No aortic aneurysm. Advanced atherosclerotic vascular disease. No mesenteric or retroperitoneal mass or adenopathy. Other:  Small amount of free abdominal and free pelvic fluid. Musculoskeletal: No significant bony findings. IMPRESSION: 1. Very limited examination due to respiratory motion. 2. No evidence of pancreatic mass or acute inflammatory process. 3. 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 4. Small amount of free abdominal and free pelvic fluid. 5. Small kidneys with innumerable cysts likely related to hemodialysis. Electronically Signed   By: Marijo Sanes M.D.   On: 04/08/2021 16:24   DG Knee Left Port  Result Date: 04/09/2021 CLINICAL DATA:  Bilateral knee pain, fall on Monday night EXAM: PORTABLE LEFT KNEE - 1-2 VIEW COMPARISON:  None. FINDINGS: Dense atherosclerotic calcification is noted. Small vascular clips along the medial left leg probably from prior saphenous vein harvesting. Trace knee effusion. Low-level subcutaneous edema anterior to the patellar tendon. IMPRESSION: 1. Trace knee effusion.  No fracture identified. 2. Mild subcutaneous edema anterior to the patellar tendon. 3. Dense atherosclerotic calcifications. Electronically Signed   By: Van Clines M.D.   On: 04/09/2021 12:53   DG Knee Right Port  Result Date: 04/09/2021 CLINICAL DATA:  Bilateral knee pain, fall on Monday night. EXAM: PORTABLE RIGHT KNEE - 1-2 VIEW COMPARISON:  None. FINDINGS: Dense atherosclerotic calcifications. Moderate medial compartmental articular space narrowing but without substantial spurring. Trace  knee effusion. No fracture is identified. Mild prepatellar subcutaneous edema. IMPRESSION: 1. Mild prepatellar subcutaneous edema. 2. Trace knee effusion. 3. Mild degenerative loss of articular space in the medial compartment. 4. Dense atherosclerotic calcifications. Electronically Signed   By: Van Clines M.D.   On: 04/09/2021 12:51        Scheduled Meds:  budesonide (PULMICORT) nebulizer solution  0.25 mg Nebulization BID   Chlorhexidine Gluconate Cloth  6 each Topical Q0600   cinacalcet  180 mg Oral Q T,Th,Sa-HD   dextrose  1 Tube Oral QID   feeding supplement (NEPRO CARB STEADY)  237 mL Oral BID BM   folic acid  1 mg Oral Daily   metoprolol tartrate  50 mg Oral BID   multivitamin  1 tablet Oral QHS   pantoprazole  40 mg Oral QHS   QUEtiapine  100 mg Oral QHS   sevelamer carbonate  3,200 mg Oral TID WC   thiamine  100 mg Oral Daily   Continuous Infusions:  sodium chloride Stopped (04/05/21 2244)   iron sucrose       LOS: 7 days    Time spent: 30 minutes.    Barb Merino, MD [facility] Pager [phone]

## 2021-04-10 NOTE — TOC Progression Note (Signed)
Transition of Care [facility]) - Progression Note    Patient Details  Name: Raymond [name] MRN: [id number] Date of Birth: 10/09/1955  Transition [facility]) CM/SW [place], LCSW Phone Number:[phone] 04/10/2021, 4:45 PM  Clinical Narrative:    CSW started pt's authorization through portal. Auth ID # [id number]. Facility would need to be given once chosen.  TOC team will continue to assist with discharge planning needs.    Expected Discharge Plan: Skilled Nursing Facility Barriers to Discharge: SNF Pending bed offer, Insurance Authorization  Expected Discharge Plan and Services Expected Discharge Plan: [place]       Living arrangements for the past 2 months: Apartment                                       Social Determinants of Health (SDOH) Interventions    Readmission Risk Interventions Readmission Risk Prevention Plan 06/22/2020  Transportation Screening Complete  Medication Review [employer]) Complete  PCP or Specialist appointment within 3-5 days of discharge Complete  HRI or [place] Complete  SW Recovery Care/Counseling Consult Complete  [place] Patient Refused  Some recent data might be hidden

## 2021-04-10 NOTE — Progress Notes (Addendum)
Subjective: Alert watching TV thinks today's Friday is at [facility], for dialysis today on schedule  Objective Vital signs in last 24 hours: Vitals:   04/10/21 0017 04/10/21 0134 04/10/21 0414 04/10/21 0740  BP: (!) 173/98 (!) 161/82 (!) 156/83 (!) 170/78  Pulse: 94  83 85  Resp: 17  18 18   Temp: 98.5 F (36.9 C)  98.5 F (36.9 C) 98.1 F (36.7 C)  TempSrc: Oral  Oral Axillary  SpO2: 98%  97% 97%  Weight:    77.8 kg  Height:       Weight change:   Physical Exam: General: Watching TV alert thinks today's "Friday , at [facility]," NAD, appears baseline MS Heart: RRR no MRG Lungs: CTA nonlabored breathing Abdomen: NABS, soft NT ND Extremities: Trace left pedal edema no right lower extremity edema  dialysis Access: Left forearm AV fistula positive bruit  Dialysis Orders:  TTS - [place] 4hrs, BFR 450, DFR Auto flow 1.5,  EDW 69kg, 2K/ 2Ca Sensipar 180mg  with HD-last dose 03/25/21 Calcitriol 34mcg (recently raised) with HD-last dose (2.66mcg) on 03/25/21 Venofer 100mg  IV X 10 doses-hasn't been given yet.  Problem/Plan: Acute encephalopathy= hyperglycemia and status postcardiac arrest with element of hypoxic/anoxic brain injury.  Appears mental status baseline now.  Noted MRI abdomen negative for pancreatic tumor. Wu per admit ESRD -HD on TTS schedule, no volume excess Anemia -Hgb 8.9 on 9/29, no current ESA follow-up hemoglobin today start if still below 10.  Noted as outpatient iron deficient we will give Venofer dosing here Secondary hyperparathyroidism -elevated phosphorus with history of noncompliance with binders as outpatient, currently on Renvela 800 mg 4 with meals, follow-up calcium phosphorus trend, continue Sensipar on dialysis and calcitriol HTN/volume -a.m. BP slightly high follow-up trend after dialysis  Nutrition =albumin 2.5, currently on regular diet because of decreased p.o. intake K4.4 on Nepro supplement, had been requiring some IV dextrose.  [name], PA-C [facility] Beeper [phone] 04/10/2021,8:25 AM  LOS: 7 days   Labs: Basic Metabolic Panel: Recent Labs  Lab 04/03/21 1059 04/03/21 1629 04/05/21 1134 04/06/21 0801 04/07/21 0135 04/08/21 1418 04/09/21 1200 04/09/21 1611 04/09/21 2023 04/10/21 0405  NA 140   < > 135 126* 129* 130*  --   --   --   --   K 5.1   < > 5.1 4.9 4.4 4.4  --   --   --   --   CL 100   < > 94* 89* 93* 92*  --   --   --   --   CO2 16*   < > 24 22 25 24   --   --   --   --   GLUCOSE 329*   < > 92 102* 112* 101*   < > 77 107* 88  BUN 104*   < > 69* 75* 34* 52*  --   --   --   --   CREATININE 19.63*   < > 12.35* 12.53* 7.72* 10.05*  --   --   --   --   CALCIUM 9.0   < > 8.4* 8.4* 8.4* 9.0  --   --   --   --   PHOS >30.1*  --  8.6*  --   --  8.2*  --   --   --   --    < > = values in this interval not displayed.   Liver Function Tests: Recent Labs  Lab 04/04/21 0500  04/05/21 1134 04/08/21 1418  AST 19 18  --   ALT 18 20  --   ALKPHOS 42 51  --   BILITOT 0.9 1.0  --   PROT 5.4* 6.7  --   ALBUMIN 2.2* 2.6* 2.5*   No results for input(s): LIPASE, AMYLASE in the last 168 hours. No results for input(s): AMMONIA in the last 168 hours. CBC: Recent Labs  Lab 04/04/21 0500 04/04/21 0638 04/05/21 1134 04/06/21 0801 04/07/21 0135 04/08/21 1418  WBC 3.2*  --  5.0 4.5 3.5* 3.5*  NEUTROABS 2.5  --   --   --   --   --   HGB 11.1*   < > 9.7* 9.3* 9.1* 8.9*  HCT 33.3*   < > 30.3* 28.5* 28.1* 28.1*  MCV 101.5*  --  102.0* 100.7* 101.4* 102.9*  PLT 75*  --  69* 75* 74* 87*   < > = values in this interval not displayed.   Cardiac Enzymes: No results for input(s): CKTOTAL, CKMB, CKMBINDEX, TROPONINI in the last 168 hours. CBG: Recent Labs  Lab 04/09/21 0713 04/09/21 1106 04/09/21 1129 04/10/21 0103 04/10/21 0807  GLUCAP 84 57* 50* 85 84    Medications:  sodium chloride Stopped (04/05/21 2244)    budesonide (PULMICORT) nebulizer solution  0.25 mg Nebulization BID    Chlorhexidine Gluconate Cloth  6 each Topical Q0600   cinacalcet  180 mg Oral Q T,Th,Sa-HD   dextrose  1 Tube Oral QID   feeding supplement (NEPRO CARB STEADY)  237 mL Oral BID BM   folic acid  1 mg Oral Daily   ipratropium-albuterol  3 mL Nebulization BID   metoprolol tartrate  50 mg Oral BID   multivitamin  1 tablet Oral QHS   pantoprazole  40 mg Oral QHS   QUEtiapine  100 mg Oral QHS   sevelamer carbonate  3,200 mg Oral TID WC   thiamine  100 mg Oral Daily

## 2021-04-10 NOTE — TOC Initial Note (Signed)
Transition of Care [facility]) - Initial/Assessment Note    Patient Details  Name: Raymond [name] MRN: [id number] Date of Birth: 1955/08/09  Transition of Care [facility]) CM/SW Contact:    Bary Castilla, LCSW Phone Number: [phone] 04/10/2021, 4:10 PM  Clinical Narrative:       CSW met with patient to discuss PT recommendation of a SNF. Patient was aware of recommendation and in agreement with going to a ST SNF. CSW discussed the SNF process.CSW provided patient with medicare.gov rating list.  Patient gave CSW permission to fax referrals out to local facilities.CSW answered questions about the SNF process and the next steps in the process.   Pt informs CSW that he has had both of his vaccines and booster. CSW confirmed that his contact is his sister Neoma Laming. Pt informs CSW that he has been to a SNF before. Pt confirms that he receives HD form [facility] on [address] however cannot remember seat time.    TOC team will continue to assist with discharge planning needs.             Expected Discharge Plan: Skilled Nursing Facility Barriers to Discharge: SNF Pending bed offer, Insurance Authorization   Patient Goals and CMS Choice        Expected Discharge Plan and Services Expected Discharge Plan: [place] arrangements for the past 2 months: Apartment                                      Prior Living Arrangements/Services Living arrangements for the past 2 months: Apartment Lives with:: Self Patient language and need for interpreter reviewed:: Yes          Care giver support system in place?: Yes (comment)      Activities of Daily Living      Permission Sought/Granted      Share Information with NAME: Neoma Laming  Permission granted to share info w AGENCY: SNFs  Permission granted to share info w Relationship: Sister  Permission granted to share info w Contact Information: [phone]  Emotional Assessment Appearance::  Appears older than stated age Attitude/Demeanor/Rapport: Engaged Affect (typically observed): Accepting Orientation: : Oriented to Self, Oriented to Place, Oriented to  Time, Oriented to Situation      Admission diagnosis:  Cardiac arrest ([place]) [I46.9] Hyperkalemia [E87.5] Hypothermia, initial encounter [T68.XXXA] Patient Active Problem List   Diagnosis Date Noted   Cardiac arrest ([place]) 04/03/2021   Anisocoria 04/03/2021   HFrEF (heart failure with reduced ejection fraction) ([place]) 04/03/2021   Hypothermia not due to cold exposure 04/03/2021   Junctional bradycardia 04/03/2021   Pancytopenia ([place]) 01/25/2021   AMS (altered mental status) 01/19/2021   Chronic viral hepatitis C ([place]) 12/25/2020   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 ([place]) 08/05/2020   Acute upper GI bleed 24/03/7352   Acute metabolic encephalopathy 29/92/4268   COVID-19 virus infection 07/29/2020   AVM (arteriovenous malformation) of stomach, acquired with hemorrhage    AVM (arteriovenous malformation) of small bowel, acquired with hemorrhage    Current use of long term anticoagulation    Sensorineural hearing loss (SNHL) of both ears 03/30/2020   History of arteriovenous malformation (AVM) 01/14/2020   UGIB (upper gastrointestinal bleed) 01/14/2020   Acute blood loss anemia 01/14/2020   Elevated troponin 01/14/2020   Allergy, unspecified, initial encounter 04/29/2019   Gastroenteritis 12/24/2018  Substance use disorder 12/24/2018   Type 2 diabetes mellitus with other diabetic kidney complication ([place]) 02/72/5366   Noncompliance of patient with renal dialysis ([place]) 09/12/2018   Benign neoplasm of colon    Melena    Anemia due to chronic kidney disease, on chronic dialysis ([place])    Anemia    H/O medication noncompliance    Polysubstance (excluding opioids) dependence ([place])    Atrial flutter with rapid ventricular response ([place]) 08/10/2018   PAF (paroxysmal atrial fibrillation) ([place]) 08/01/2018    Acute respiratory failure with hypoxia ([place]) 07/13/2018   Non-compliance with renal dialysis ([place]) 07/13/2018   PAD (peripheral artery disease) ([place]) 04/11/2018   Sepsis ([place]) 03/31/2018   Preop cardiovascular exam 03/29/2018   Idiopathic gout, unspecified site 12/13/2017   Other specified complication of vascular prosthetic devices, implants and grafts, initial encounter ([place]) 11/03/2017   Volume overload 08/07/2017   Acute pulmonary edema (HCC)    Dyspnea 06/20/2017   Acute bronchitis    COPD (chronic obstructive pulmonary disease) ([place])    ESRD on hemodialysis ([place])    Hypoxia 12/05/2016   Hyperkalemia 12/19/2015   Hypoglycemia 12/19/2015   Polysubstance abuse ([place]) 12/19/2015   Homeless 12/19/2015   Anemia associated with stage 5 chronic renal failure ([place]) 12/19/2015   BPH without urinary obstruction 10/29/2015   Erectile dysfunction 10/29/2015   Hematuria, gross 10/29/2015   Hypercalcemia 11/07/2014   Pruritus, unspecified 10/17/2014   Coagulation defect, unspecified ([place]) 09/17/2014   Atypical chest pain 12/19/2013   Thrombocytopenia, unspecified ([place]) 10/17/2013   Iron deficiency anemia, unspecified 08/28/2013   Atherosclerosis of native arteries of extremity with intermittent claudication ([place]) 12/04/2012   ESRD (end stage renal disease) ([place]) 12/04/2012   Hypertensive chronic kidney disease with stage 5 chronic kidney disease or end stage renal disease ([place]) 11/20/2012   Secondary hyperparathyroidism of renal origin ([place]) 11/20/2012   HEPATITIS C 09/07/2006   HYPERCHOLESTEROLEMIA 09/07/2006   HYPERTENSION, BENIGN SYSTEMIC 09/07/2006   PCP:  [name], FNP Pharmacy:   [id number] [address], [place] [address] [place] [address] Phone: [phone] Fax: [phone]  [facility] [address] [place] [address] Phone: [phone]  Fax: [phone]     Social Determinants of Health (SDOH) Interventions    Readmission Risk Interventions Readmission Risk Prevention Plan 06/22/2020  Transportation Screening Complete  Medication Review ([place]) Complete  PCP or Specialist appointment within 3-5 days of discharge Complete  HRI or [place] Complete  SW Recovery Care/Counseling Consult Complete  [place] Patient Refused  Some recent data might be hidden

## 2021-04-10 NOTE — Procedures (Signed)
Patient seen on Hemodialysis. BP (!) 178/92   Pulse 86   Temp 97.9 F (36.6 C) (Oral)   Resp 18   Ht 5\' 7"  (1.702 m)   Wt 77.6 kg   SpO2 96%   BMI 26.79 kg/m   QB 400, UF goal 2.5L Tolerating treatment without complaints at this time.   Elmarie Shiley MD [facility]. Office # [phone] Pager # [phone] 9:24 AM

## 2021-04-10 NOTE — NC FL2 (Signed)
[place] MEDICAID FL2 LEVEL OF CARE SCREENING TOOL     IDENTIFICATION  Patient Name: Raymond [name] Birthdate: 31-Dec-1955 Sex: male Admission Date (Current Location): 04/03/2021  [facility] and [place] Number:  Herbalist and Address:  [facility] [place] [facility] [place] [address] [place], [place] [address]      Provider Number: [id number]  Attending Physician Name and Address:  Barb Merino, MD  Relative Name and Phone Number:  Neoma Laming [phone]    Current Level of Care: Hospital Recommended Level of Care: [place] Prior Approval Number:    Date Approved/Denied:   PASRR Number: [id number]  Discharge Plan: SNF    Current Diagnoses: Patient Active Problem List   Diagnosis Date Noted   Cardiac arrest ([place]) 04/03/2021   Anisocoria 04/03/2021   HFrEF (heart failure with reduced ejection fraction) ([place]) 04/03/2021   Hypothermia not due to cold exposure 04/03/2021   Junctional bradycardia 04/03/2021   Pancytopenia ([place]) 01/25/2021   AMS (altered mental status) 01/19/2021   Chronic viral hepatitis C ([place]) 12/25/2020   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 ([place]) 08/05/2020   Acute upper GI bleed 32/44/0102   Acute metabolic encephalopathy 72/53/6644   COVID-19 virus infection 07/29/2020   AVM (arteriovenous malformation) of stomach, acquired with hemorrhage    AVM (arteriovenous malformation) of small bowel, acquired with hemorrhage    Current use of long term anticoagulation    Sensorineural hearing loss (SNHL) of both ears 03/30/2020   History of arteriovenous malformation (AVM) 01/14/2020   UGIB (upper gastrointestinal bleed) 01/14/2020   Acute blood loss anemia 01/14/2020   Elevated troponin 01/14/2020   Allergy, unspecified, initial encounter 04/29/2019   Gastroenteritis 12/24/2018   Substance use disorder 12/24/2018   Type 2 diabetes mellitus with other diabetic kidney complication ([place]) 03/47/4259    Noncompliance of patient with renal dialysis ([place]) 09/12/2018   Benign neoplasm of colon    Melena    Anemia due to chronic kidney disease, on chronic dialysis ([place])    Anemia    H/O medication noncompliance    Polysubstance (excluding opioids) dependence ([place])    Atrial flutter with rapid ventricular response ([place]) 08/10/2018   PAF (paroxysmal atrial fibrillation) ([place]) 08/01/2018   Acute respiratory failure with hypoxia ([place]) 07/13/2018   Non-compliance with renal dialysis ([place]) 07/13/2018   PAD (peripheral artery disease) ([place]) 04/11/2018   Sepsis ([place]) 03/31/2018   Preop cardiovascular exam 03/29/2018   Idiopathic gout, unspecified site 12/13/2017   Other specified complication of vascular prosthetic devices, implants and grafts, initial encounter ([place]) 11/03/2017   Volume overload 08/07/2017   Acute pulmonary edema (HCC)    Dyspnea 06/20/2017   Acute bronchitis    COPD (chronic obstructive pulmonary disease) ([place])    ESRD on hemodialysis ([place])    Hypoxia 12/05/2016   Hyperkalemia 12/19/2015   Hypoglycemia 12/19/2015   Polysubstance abuse ([place]) 12/19/2015   Homeless 12/19/2015   Anemia associated with stage 5 chronic renal failure ([place]) 12/19/2015   BPH without urinary obstruction 10/29/2015   Erectile dysfunction 10/29/2015   Hematuria, gross 10/29/2015   Hypercalcemia 11/07/2014   Pruritus, unspecified 10/17/2014   Coagulation defect, unspecified ([place]) 09/17/2014   Atypical chest pain 12/19/2013   Thrombocytopenia, unspecified ([place]) 10/17/2013   Iron deficiency anemia, unspecified 08/28/2013   Atherosclerosis of native arteries of extremity with intermittent claudication ([place]) 12/04/2012   ESRD (end stage renal disease) ([place]) 12/04/2012   Hypertensive chronic kidney disease with stage 5 chronic kidney disease  or end stage renal disease ([place]) 11/20/2012   Secondary hyperparathyroidism of renal origin ([place]) 11/20/2012   HEPATITIS C 09/07/2006   HYPERCHOLESTEROLEMIA  09/07/2006   HYPERTENSION, BENIGN SYSTEMIC 09/07/2006    Orientation RESPIRATION BLADDER Height & Weight     Self, Time, Situation, Place  Normal Continent Weight: 165 lb 5.5 oz (75 kg) Height:  5\' 7"  (170.2 cm)  BEHAVIORAL SYMPTOMS/MOOD NEUROLOGICAL BOWEL NUTRITION STATUS      Incontinent Diet (Please see discharge summary)  AMBULATORY STATUS COMMUNICATION OF NEEDS Skin   Limited Assist Verbally Normal, Other (Comment) (appropriate for ethnicity, dry)                       Personal Care Assistance Level of Assistance  Bathing, Feeding, Dressing Bathing Assistance: Limited assistance Feeding assistance: Independent Dressing Assistance: Limited assistance     Functional Limitations Info  Sight, Hearing, Speech   Hearing Info: Adequate Speech Info: Adequate    SPECIAL CARE FACTORS FREQUENCY  PT (By licensed PT), OT (By licensed OT)     PT Frequency: 5x min weekly OT Frequency: 5x min weekly            Contractures Contractures Info: Not present    Additional Factors Info  Psychotropic Code Status Info: FULL   Psychotropic Info: QUEtiapine (SEROQUEL) tablet 100 mg daily at bedtime         Current Medications (04/10/2021):  This is the current hospital active medication list Current Facility-Administered Medications  Medication Dose Route Frequency Provider Last Rate Last Admin   0.9 %  sodium chloride infusion   Intravenous PRN Maryjane Hurter, MD   Stopped at 04/05/21 2244   budesonide (PULMICORT) nebulizer solution 0.25 mg  0.25 mg Nebulization BID Andres Labrum D, PA-C   0.25 mg at 04/10/21 0746   calcium chloride injection   Intravenous PRN Long, Wonda Olds, MD   1 g at 04/03/21 0031   Chlorhexidine Gluconate Cloth 2 % PADS 6 each  6 each Topical Q0600 Adelfa Koh, NP   6 each at 04/10/21 0513   cinacalcet (SENSIPAR) tablet 180 mg  180 mg Oral Q T,Th,Sa-HD Barb Merino, MD   180 mg at 04/10/21 1245   dextrose (GLUTOSE) 40 % oral gel 31 g  1  Tube Oral QID Barb Merino, MD   31 g at 04/10/21 1417   docusate sodium (COLACE) capsule 100 mg  100 mg Oral BID PRN Mick Sell, PA-C       feeding supplement (NEPRO CARB STEADY) liquid 237 mL  237 mL Oral BID BM Barb Merino, MD   237 mL at 27/25/36 6440   folic acid (FOLVITE) tablet 1 mg  1 mg Oral Daily Sloan Leiter B, RPH   1 mg at 04/10/21 1417   hydrALAZINE (APRESOLINE) injection 10 mg  10 mg Intravenous Q4H PRN Anders Simmonds, MD   10 mg at 04/10/21 0104   ipratropium-albuterol (DUONEB) 0.5-2.5 (3) MG/3ML nebulizer solution 3 mL  3 mL Nebulization Q4H PRN Barb Merino, MD       iron sucrose (VENOFER) 100 mg in sodium chloride 0.9 % 100 mL IVPB  100 mg Intravenous Q T,Th,Sa-HD [name], PA-C   Stopped at 04/10/21 1245   metoprolol tartrate (LOPRESSOR) tablet 50 mg  50 mg Oral BID Barb Merino, MD   50 mg at 04/10/21 1417   multivitamin (RENA-VIT) tablet 1 tablet  1 tablet Oral QHS Jacky Kindle, MD   1 tablet  at 04/09/21 2104   oxyCODONE (Oxy IR/ROXICODONE) immediate release tablet 5 mg  5 mg Oral Q6H PRN Barb Merino, MD   5 mg at 04/09/21 1611   pantoprazole (PROTONIX) EC tablet 40 mg  40 mg Oral QHS Chand, Currie Paris, MD   40 mg at 04/09/21 2104   polyethylene glycol (MIRALAX / GLYCOLAX) packet 17 g  17 g Oral Daily PRN Mick Sell, PA-C       QUEtiapine (SEROQUEL) tablet 100 mg  100 mg Oral QHS Barb Merino, MD   100 mg at 04/09/21 2104   sevelamer carbonate (RENVELA) tablet 3,200 mg  3,200 mg Oral TID WC Barb Merino, MD   3,200 mg at 04/10/21 1416   thiamine tablet 100 mg  100 mg Oral Daily Sloan Leiter B, RPH   100 mg at 04/10/21 1416   traZODone (DESYREL) tablet 50 mg  50 mg Oral QHS PRN Chotiner, Yevonne Aline, MD   50 mg at 04/08/21 2253   Facility-Administered Medications Ordered in Other Encounters  Medication Dose Route Frequency Provider Last Rate Last Admin   midazolam (VERSED) 5 MG/5ML injection    Anesthesia Intra-op Sammie Bench, CRNA   2  mg at 04/11/18 1042     Discharge Medications: Please see discharge summary for a list of discharge medications.  Relevant Imaging Results:  Relevant Lab Results:   Additional Information [phone]  Milas Gain, LCSWA

## 2021-04-11 LAB — GLUCOSE, CAPILLARY
Glucose-Capillary: 105 mg/dL — ABNORMAL HIGH (ref 70–99)
Glucose-Capillary: 82 mg/dL (ref 70–99)
Glucose-Capillary: 84 mg/dL (ref 70–99)
Glucose-Capillary: 88 mg/dL (ref 70–99)
Glucose-Capillary: 91 mg/dL (ref 70–99)
Glucose-Capillary: 92 mg/dL (ref 70–99)

## 2021-04-11 MED ORDER — AMLODIPINE BESYLATE 5 MG PO TABS
5.0000 mg | ORAL_TABLET | Freq: Every day | ORAL | Status: DC
  Administered 2021-04-11 – 2021-04-14 (×4): 5 mg via ORAL
  Filled 2021-04-11 (×4): qty 1

## 2021-04-11 MED ORDER — DARBEPOETIN ALFA 60 MCG/0.3ML IJ SOSY
60.0000 ug | PREFILLED_SYRINGE | INTRAMUSCULAR | Status: DC
  Filled 2021-04-11 (×2): qty 0.3

## 2021-04-11 NOTE — TOC Progression Note (Signed)
Transition of Care [facility]) - Progression Note    Patient Details  Name: Raymond [name] MRN: [id number] Date of Birth: July 11, 1956  [facility]) CM/SW [place], LCSW Phone Number:[phone] 04/11/2021, 3:27 PM  Clinical Narrative:    Pt's auth still pending and currently no bed offers.  TOC team will continue to assist with discharge planning needs.    Expected Discharge Plan: Skilled Nursing Facility Barriers to Discharge: SNF Pending bed offer, Insurance Authorization  Expected Discharge Plan and Services Expected Discharge Plan: [place]       Living arrangements for the past 2 months: Apartment                                       Social Determinants of Health (SDOH) Interventions    Readmission Risk Interventions Readmission Risk Prevention Plan 06/22/2020  Transportation Screening Complete  Medication Review Press photographer) Complete  PCP or Specialist appointment within 3-5 days of discharge Complete  HRI or [place] Complete  SW Recovery Care/Counseling Consult Complete  [place] Patient Refused  Some recent data might be hidden

## 2021-04-11 NOTE — Progress Notes (Addendum)
Subjective:   Seen in room, watching TV pleasantly confused, aware he is at [facility] ,said tolerated dialysis yesterday 0.5 L UF  Vital signs in last 24 hours: Vitals:   04/10/21 1255 04/10/21 1306 04/10/21 2008 04/11/21 0418  BP: (!) 166/99 (!) 144/76 (!) 146/111 (!) 153/126  Pulse: 90 99 88 79  Resp:  18 18 14   Temp:  98.9 F (37.2 C) 98 F (36.7 C) 98.9 F (37.2 C)  TempSrc:  Oral Oral Oral  SpO2:  98% 95% 98%  Weight: 75 kg   75.1 kg  Height:       Weight change:   Physical Exam: General: Adult male, alert, NAD watching TV    Heart: RRR no MRG Lungs: CTA nonlabored breathing Abdomen: NABS, soft NT ND Extremities: Trace left pedal edema no right lower extremity edema  dialysis Access: Left forearm AV fistula positive bruit   Dialysis Orders:  TTS - [facility] 4hrs, BFR 450, DFR Auto flow 1.5,  EDW 69kg, 2K/ 2Ca Sensipar 180mg  with HD-last dose 03/25/21 Calcitriol 58mcg (recently raised) with HD-last dose (2.26mcg) on 03/25/21 Venofer 100mg  IV X 10 doses-hasn't been given yet.   Problem/Plan: Acute encephalopathy= hyperglycemia and status postcardiac arrest with element of hypoxic/anoxic brain injury.  Appears mental status baseline now.  Noted MRI abdomen negative for pancreatic tumor. Wu per admit ESRD -HD on TTS schedule, no volume excess Anemia -Hgb 8.9 on 9/29> 8.7, no current ESA.  Start Aranesp 60 next dialysis Tuesday 10/04 noted as outpatient iron deficient we will give Venofer dosing here Secondary hyperparathyroidism -admit elevated phosphorus with history of binder noncompliance now improved to 5.5  currently on Renvela 800 mg 4 with meals, continue Sensipar on dialysis and calcitriol HTN/volume -a.m. BP hypertension , restart home med amlodipine 5 mg at bedtime, metoprolol milligrams twice daily restarted 9/30 ,  continue UF as tolerated no excess volume on exam need standing weight Nutrition =albumin 2.6, currently on regular diet because of decreased  p.o. intake K3.9 yesterday on Nepro supplement, on admit had been requiring some IV dextrose.   [name], PA-C [facility] Beeper [phone] 04/11/2021,10:38 AM  LOS: 8 days   Labs: Basic Metabolic Panel: Recent Labs  Lab 04/05/21 1134 04/06/21 0801 04/07/21 0135 04/08/21 1418 04/09/21 1200 04/09/21 2023 04/10/21 0405 04/10/21 0819  NA 135   < > 129* 130*  --   --   --  133*  K 5.1   < > 4.4 4.4  --   --   --  3.9  CL 94*   < > 93* 92*  --   --   --  97*  CO2 24   < > 25 24  --   --   --  27  GLUCOSE 92   < > 112* 101*   < > 107* 88 90  BUN 69*   < > 34* 52*  --   --   --  32*  CREATININE 12.35*   < > 7.72* 10.05*  --   --   --  8.07*  CALCIUM 8.4*   < > 8.4* 9.0  --   --   --  9.5  PHOS 8.6*  --   --  8.2*  --   --   --  5.5*   < > = values in this interval not displayed.   Liver Function Tests: Recent Labs  Lab 04/05/21 1134 04/08/21 1418 04/10/21 0819  AST 18  --   --  ALT 20  --   --   ALKPHOS 51  --   --   BILITOT 1.0  --   --   PROT 6.7  --   --   ALBUMIN 2.6* 2.5* 2.6*   No results for input(s): LIPASE, AMYLASE in the last 168 hours. No results for input(s): AMMONIA in the last 168 hours. CBC: Recent Labs  Lab 04/05/21 1134 04/06/21 0801 04/07/21 0135 04/08/21 1418 04/10/21 0819  WBC 5.0 4.5 3.5* 3.5* 2.9*  HGB 9.7* 9.3* 9.1* 8.9* 8.7*  HCT 30.3* 28.5* 28.1* 28.1* 27.4*  MCV 102.0* 100.7* 101.4* 102.9* 103.4*  PLT 69* 75* 74* 87* 112*   Cardiac Enzymes: No results for input(s): CKTOTAL, CKMB, CKMBINDEX, TROPONINI in the last 168 hours. CBG: Recent Labs  Lab 04/10/21 1533 04/10/21 2021 04/11/21 0016 04/11/21 0417 04/11/21 0715  GLUCAP 75 91 84 82 88    Studies/Results: No results found. Medications:  sodium chloride Stopped (04/05/21 2244)   iron sucrose Stopped (04/10/21 1245)    budesonide (PULMICORT) nebulizer solution  0.25 mg Nebulization BID   Chlorhexidine Gluconate Cloth  6 each Topical Q0600    cinacalcet  180 mg Oral Q T,Th,Sa-HD   dextrose  1 Tube Oral QID   feeding supplement (NEPRO CARB STEADY)  237 mL Oral BID BM   folic acid  1 mg Oral Daily   metoprolol tartrate  50 mg Oral BID   multivitamin  1 tablet Oral QHS   pantoprazole  40 mg Oral QHS   QUEtiapine  100 mg Oral QHS   sevelamer carbonate  3,200 mg Oral TID WC   thiamine  100 mg Oral Daily

## 2021-04-11 NOTE — Progress Notes (Signed)
PROGRESS NOTE    Raymond [name]  RCV:[id number] DOB: May 07, 1956 DOA: 04/03/2021 PCP: [name], FNP    Brief Narrative:  65 year old gentleman with history of ESRD on hemodialysis, pancytopenia, chronic systolic heart failure, hypertension, coronary arteries, peripheral arterial disease, COPD, hepatitis C treated presented to emergency room on 9/24 with altered mental status.  A well check was requested by family member.  Patient was found at home unresponsive and hypoglycemic with blood sugars 22.  Subsequently experienced PEA arrest in the field.  CPR and ROSC achieved in 20 minutes of CPR.  He was hypothermic.  Intubated and admitted to ICU.  This is the third admission for this patient with hypoglycemia in the hospital. Admitted and discharged 6/3 with altered mental status and hypoglycemia Admitted and discharged 7/13 with altered mental status and hypoglycemia. This time with severe hypoglycemia and hypothermia causing cardiac arrest. C-peptide elevated.  Did not respond to octreotide.  Insulin and pro insulin level is normal.   Assessment & Plan:   Principal Problem:   Cardiac arrest ([place]) Active Problems:   ESRD (end stage renal disease) (HCC)   Hyperkalemia   Hypoglycemia   Pancytopenia (HCC)   Anisocoria   HFrEF (heart failure with reduced ejection fraction) (HCC)   Hypothermia not due to cold exposure   Junctional bradycardia  PEA cardiac arrest: Secondary to hypoglycemia and hypothermia.  Apparently was not eating well. Echocardiogram with ejection fraction 50 to 55%, left ventricular global hypokinesis, severe LVH. Seen by cardiology.  Recommended no further invasive testing. Resumed beta-blockers.  Hypothermia secondary to hypoglycemia: Probably due to poor oral intake and absorption. C-peptide elevated.  Insulin and proinsulin levels were normal.  With normal insulin level and normal MRI of the pancreas, suspicion of insulin secreting tumor is low. Treated  with IV dextrose.  Now appetite has improved and eating regular diet. Encourage oral intake, increase carbohydrate intake, scheduled oral dextrose . IV dextrose only for symptomatic hypoglycemia. Blood sugars are stabilized last 24 hours.  Acute metabolic encephalopathy, toxic metabolic encephalopathy with myoclonus: Due to hypoglycemia. Nonfocal.  Clinically improved.  Acute on chronic hypercapnic respiratory failure: Status post intubation and extubation.  Improved.  On room air today.  ESRD on hemodialysis: Followed by nephrology.  On dialysis schedule.  Paroxysmal A. fib: Rate controlled.  Sinus rhythm.  Patient on amiodarone and metoprolol.   Followed by cardiology.  Severe GI bleeding in the past, not on any anticoagulation. Lower extremity duplex negative for DVT. On beta-blockers now.  Already sinus rhythm, amiodarone discontinued.  Chronic systolic heart failure: Patient on metoprolol.  Euvolemic.  Physical debility and deconditioning: Followed by PT OT.  Recommended SNF.  Medically stable.  Chronic GI bleed: Patient does have chronic intermittent GI bleed.  EGD, colonoscopy, capsule endoscopy completed within 6 months with evidence of AVM.  Will need repeat testing for any profuse rectal bleeding, otherwise symptomatic treatment.  Blood transfusion for hemoglobin less than 8. Not a candidate for anticoagulation.   DVT prophylaxis: SCDs Start: 04/03/21 0130   Code Status: Full code Family Communication: None Disposition Plan: Status is: Inpatient  Remains inpatient appropriate because:IV treatments appropriate due to intensity of illness or inability to take PO and Inpatient level of care appropriate due to severity of illness  Dispo: The patient is from: Home              Anticipated d/c is to: SNF.              Patient  currently is currently medically stable for rehab level of care.   Difficult to place patient No         Consultants:   PCCM Cardiology Neurology  Procedures:  None none  Antimicrobials:  None   Subjective: Seen and examined.  Leg pain is better.  Eating better.  No other overnight events.  Accu-Cheks are mostly 80 or more than 80.  Objective: Vitals:   04/10/21 1306 04/10/21 2008 04/11/21 0418 04/11/21 1211  BP: (!) 144/76 (!) 146/111 (!) 153/126 (!) 178/103  Pulse: 99 88 79 73  Resp: 18 18 14 16   Temp: 98.9 F (37.2 C) 98 F (36.7 C) 98.9 F (37.2 C) 97.7 F (36.5 C)  TempSrc: Oral Oral Oral Oral  SpO2: 98% 95% 98% 95%  Weight:   75.1 kg   Height:        Intake/Output Summary (Last 24 hours) at 04/11/2021 1313 Last data filed at 04/10/2021 1502 Gross per 24 hour  Intake 105 ml  Output --  Net 105 ml   Filed Weights   04/10/21 0910 04/10/21 1255 04/11/21 0418  Weight: 77.6 kg 75 kg 75.1 kg    Examination:  General exam: Appears calm and comfortable .Chronically sick looking.  Debilitated.  Not in any distress. Respiratory system: Clear to auscultation. Respiratory effort normal.  No added sounds. Cardiovascular system: S1 & S2 heard, RRR.  Chronic bilateral pedal edema and some tenderness.  Mild tender on examination.  No palpable fracture.  Left more than right. Gastrointestinal system: Abdomen is nondistended, soft and nontender. No organomegaly or masses felt. Normal bowel sounds heard. Central nervous system: Alert and oriented. No focal neurological deficits. Extremities: Symmetric 5 x 5 power. Skin: No rashes, lesions or ulcers Psychiatry: Judgement and insight appear normal. Mood & affect flat and anxious.    Data Reviewed: I have personally reviewed following labs and imaging studies  CBC: Recent Labs  Lab 04/05/21 1134 04/06/21 0801 04/07/21 0135 04/08/21 1418 04/10/21 0819  WBC 5.0 4.5 3.5* 3.5* 2.9*  HGB 9.7* 9.3* 9.1* 8.9* 8.7*  HCT 30.3* 28.5* 28.1* 28.1* 27.4*  MCV 102.0* 100.7* 101.4* 102.9* 103.4*  PLT 69* 75* 74* 87* 540*   Basic Metabolic  Panel: Recent Labs  Lab 04/05/21 1134 04/06/21 0801 04/07/21 0135 04/08/21 1418 04/09/21 1200 04/09/21 1611 04/09/21 2023 04/10/21 0405 04/10/21 0819  NA 135 126* 129* 130*  --   --   --   --  133*  K 5.1 4.9 4.4 4.4  --   --   --   --  3.9  CL 94* 89* 93* 92*  --   --   --   --  97*  CO2 24 22 25 24   --   --   --   --  27  GLUCOSE 92 102* 112* 101* 90 77 107* 88 90  BUN 69* 75* 34* 52*  --   --   --   --  32*  CREATININE 12.35* 12.53* 7.72* 10.05*  --   --   --   --  8.07*  CALCIUM 8.4* 8.4* 8.4* 9.0  --   --   --   --  9.5  MG 2.0 2.1  --   --   --   --   --   --   --   PHOS 8.6*  --   --  8.2*  --   --   --   --  5.5*   GFR: Estimated  Creatinine Clearance: 8.3 mL/min (A) (by C-G formula based on SCr of 8.07 mg/dL (H)). Liver Function Tests: Recent Labs  Lab 04/05/21 1134 04/08/21 1418 04/10/21 0819  AST 18  --   --   ALT 20  --   --   ALKPHOS 51  --   --   BILITOT 1.0  --   --   PROT 6.7  --   --   ALBUMIN 2.6* 2.5* 2.6*   No results for input(s): LIPASE, AMYLASE in the last 168 hours.  No results for input(s): AMMONIA in the last 168 hours. Coagulation Profile: No results for input(s): INR, PROTIME in the last 168 hours. Cardiac Enzymes: No results for input(s): CKTOTAL, CKMB, CKMBINDEX, TROPONINI in the last 168 hours.  BNP (last 3 results) No results for input(s): PROBNP in the last 8760 hours. HbA1C: No results for input(s): HGBA1C in the last 72 hours. CBG: Recent Labs  Lab 04/10/21 2021 04/11/21 0016 04/11/21 0417 04/11/21 0715 04/11/21 1213  GLUCAP 91 84 82 88 105*   Lipid Profile: No results for input(s): CHOL, HDL, LDLCALC, TRIG, CHOLHDL, LDLDIRECT in the last 72 hours. Thyroid Function Tests: No results for input(s): TSH, T4TOTAL, FREET4, T3FREE, THYROIDAB in the last 72 hours. Anemia Panel: No results for input(s): VITAMINB12, FOLATE, FERRITIN, TIBC, IRON, RETICCTPCT in the last 72 hours. Sepsis Labs: No results for input(s):  PROCALCITON, LATICACIDVEN in the last 168 hours.   Recent Results (from the past 240 hour(s))  Resp Panel by RT-PCR (Flu A&B, Covid) Nasopharyngeal Swab     Status: None   Collection Time: 04/03/21 12:26 AM   Specimen: Nasopharyngeal Swab; Nasopharyngeal(NP) swabs in vial transport medium  Result Value Ref Range Status   SARS Coronavirus 2 by RT PCR NEGATIVE NEGATIVE Final    Comment: (NOTE) SARS-CoV-2 target nucleic acids are NOT DETECTED.  The SARS-CoV-2 RNA is generally detectable in upper respiratory specimens during the acute phase of infection. The lowest concentration of SARS-CoV-2 viral copies this assay can detect is 138 copies/mL. A negative result does not preclude SARS-Cov-2 infection and should not be used as the sole basis for treatment or other patient management decisions. A negative result may occur with  improper specimen collection/handling, submission of specimen other than nasopharyngeal swab, presence of viral mutation(s) within the areas targeted by this assay, and inadequate number of viral copies(<138 copies/mL). A negative result must be combined with clinical observations, patient history, and epidemiological information. The expected result is Negative.  Fact Sheet for Patients:  EntrepreneurPulse.com.au  Fact Sheet for Healthcare Providers:  IncredibleEmployment.be  This test is no t yet approved or cleared by the [place] FDA and  has been authorized for detection and/or diagnosis of SARS-CoV-2 by FDA under an Emergency Use Authorization (EUA). This EUA will remain  in effect (meaning this test can be used) for the duration of the COVID-19 declaration under Section 564(b)(1) of the Act, 21 U.S.C.section 360bbb-3(b)(1), unless the authorization is terminated  or revoked sooner.       Influenza A by PCR NEGATIVE NEGATIVE Final   Influenza B by PCR NEGATIVE NEGATIVE Final    Comment: (NOTE) The Xpert Xpress  SARS-CoV-2/FLU/RSV plus assay is intended as an aid in the diagnosis of influenza from Nasopharyngeal swab specimens and should not be used as a sole basis for treatment. Nasal washings and aspirates are unacceptable for Xpert Xpress SARS-CoV-2/FLU/RSV testing.  Fact Sheet for Patients: EntrepreneurPulse.com.au  Fact Sheet for Healthcare Providers: IncredibleEmployment.be  This test is  not yet approved or cleared by the [place] and has been authorized for detection and/or diagnosis of SARS-CoV-2 by FDA under an Emergency Use Authorization (EUA). This EUA will remain in effect (meaning this test can be used) for the duration of the COVID-19 declaration under Section 564(b)(1) of the Act, 21 U.S.C. section 360bbb-3(b)(1), unless the authorization is terminated or revoked.  Performed at [facility], [place] [address], [place], [place] [address]   Culture, blood (routine x 2)     Status: None   Collection Time: 04/03/21  1:45 AM   Specimen: BLOOD RIGHT ARM  Result Value Ref Range Status   Specimen Description BLOOD RIGHT ARM  Final   Special Requests   Final    BOTTLES DRAWN AEROBIC AND ANAEROBIC Blood Culture adequate volume   Culture   Final    NO GROWTH 5 DAYS Performed at [facility], [address] [place], [place] [address]    Report Status 04/08/2021 FINAL  Final  MRSA Next Gen by PCR, Nasal     Status: None   Collection Time: 04/03/21  2:41 AM   Specimen: Nasal Mucosa; Nasal Swab  Result Value Ref Range Status   MRSA by PCR Next Gen NOT DETECTED NOT DETECTED Final    Comment: (NOTE) The GeneXpert MRSA Assay (FDA approved for NASAL specimens only), is one component of a comprehensive MRSA colonization surveillance program. It is not intended to diagnose MRSA infection nor to guide or monitor treatment for MRSA infections. Test performance is not FDA approved in patients less than 77 years old. Performed at [facility], [place] [address], [place], [place] [address]   Culture, blood (routine x 2)     Status: None   Collection Time: 04/03/21  5:41 AM   Specimen: BLOOD RIGHT HAND  Result Value Ref Range Status   Specimen Description BLOOD RIGHT HAND  Final   Special Requests AEROBIC BOTTLE ONLY Blood Culture adequate volume  Final   Culture   Final    NO GROWTH 5 DAYS Performed at [facility], [place] [address], [place], [place] [address]    Report Status 04/08/2021 FINAL  Final         Radiology Studies: No results found.      Scheduled Meds:  amLODipine  5 mg Oral QHS   budesonide (PULMICORT) nebulizer solution  0.25 mg Nebulization BID   Chlorhexidine Gluconate Cloth  6 each Topical Q0600   cinacalcet  180 mg Oral Q T,Th,Sa-HD   [START ON 04/13/2021] darbepoetin (ARANESP) injection - DIALYSIS  60 mcg Intravenous Q Tue-HD   dextrose  1 Tube Oral QID   feeding supplement (NEPRO CARB STEADY)  237 mL Oral BID BM   folic acid  1 mg Oral Daily   metoprolol tartrate  50 mg Oral BID   multivitamin  1 tablet Oral QHS   pantoprazole  40 mg Oral QHS   QUEtiapine  100 mg Oral QHS   sevelamer carbonate  3,200 mg Oral TID WC   thiamine  100 mg Oral Daily   Continuous Infusions:  sodium chloride Stopped (04/05/21 2244)   iron sucrose Stopped (04/10/21 1245)     LOS: 8 days    Time spent: 30 minutes.    Barb Merino, MD [facility] Pager [phone]

## 2021-04-12 LAB — GLUCOSE, CAPILLARY
Glucose-Capillary: 90 mg/dL (ref 70–99)
Glucose-Capillary: 74 mg/dL (ref 70–99)
Glucose-Capillary: 93 mg/dL (ref 70–99)
Glucose-Capillary: 79 mg/dL (ref 70–99)
Glucose-Capillary: 92 mg/dL (ref 70–99)

## 2021-04-12 LAB — SARS CORONAVIRUS 2 (TAT 6-24 HRS): SARS Coronavirus 2: NEGATIVE

## 2021-04-12 MED ORDER — MELATONIN 3 MG PO TABS
3.0000 mg | ORAL_TABLET | Freq: Every evening | ORAL | Status: DC | PRN
  Administered 2021-04-12 – 2021-04-14 (×2): 3 mg via ORAL
  Filled 2021-04-12 (×2): qty 1

## 2021-04-12 NOTE — TOC Progression Note (Addendum)
Transition of Care [facility]) - Progression Note    Patient Details  Name: TAYDON NASWORTHY MRN: [id number] Date of Birth: 07/08/1956  Transition of Care [facility]) CM/SW [place], [place] Phone Number: 04/12/2021, 10:20 AM  Clinical Narrative:     Update- Patients insurance was approved. [place] ID# [id number]. Approval start date is 10/3-10/5. Next review date is 10/5. Patient will have SN[facility] bed at [facility] tomorrow if medically ready. CSW received patients HD information from [facility] renal navigator.CSW provided [place] with [facility] patients HD information. HD center is [place] Tues,Thurs, and Sat. Arrival time is 11:45am seat time is 12:05pm.Patient reports he uses [place] access/[facility] for transportation to HD. CSW informed Juliann Pulse with [facility] who can assist with making sure patients transportation is set up for HD.  CSW spoke with patient at bedside and provided patient with SNF bed offers. Patient has chose SNF placement at [facility] who confirmed they can accept patient tomorrow if medically ready. CSW provided CSW followed up with patients insurance. Patients insurance authorization is still pending. CSW will continue to follow and assist with dc planning needs.  Expected Discharge Plan: Skilled Nursing Facility Barriers to Discharge: SNF Pending bed offer, Insurance Authorization  Expected Discharge Plan and Services Expected Discharge Plan: [place]       Living arrangements for the past 2 months: Apartment                                       Social Determinants of Health (SDOH) Interventions    Readmission Risk Interventions Readmission Risk Prevention Plan 06/22/2020  Transportation Screening Complete  Medication Review Press photographer) Complete  PCP or Specialist appointment within 3-5 days of discharge Complete  HRI or [place] Complete  SW Recovery Care/Counseling Consult Complete  [place]  Patient Refused  Some recent data might be hidden

## 2021-04-12 NOTE — Progress Notes (Signed)
Confirmed with [facility] staff that pt receives out-pt HD at [facility] on TTS. Pt needs to arrive at 11:45 for a 12:05 chair time. Information provided to CSW, Ebony Hail, who is working on snf placement. Will assist as needed.   Melven Sartorius Renal Navigator [phone]

## 2021-04-12 NOTE — Progress Notes (Signed)
PROGRESS NOTE    Raymond [name]  WRU:[id number] DOB: [birth year] DOA: 04/03/2021 PCP: [name], FNP    Brief Narrative:  65 year old gentleman with history of ESRD on hemodialysis, pancytopenia, chronic systolic heart failure, hypertension, coronary arteries, peripheral arterial disease, COPD, hepatitis C treated presented to emergency room on 9/24 with altered mental status.  A well check was requested by family member.  Patient was found at home unresponsive and hypoglycemic with blood sugars 22.  Subsequently experienced PEA arrest in the field.  CPR and ROSC achieved in 20 minutes. He was hypothermic.  Intubated and admitted to ICU.  This is the third admission for this patient with hypoglycemia in the hospital. Admitted and discharged 6/3 with altered mental status and hypoglycemia Admitted and discharged 7/13 with altered mental status and hypoglycemia. This time with severe hypoglycemia and hypothermia causing cardiac arrest. C-peptide elevated.  Did not respond to octreotide.  Insulin and pro insulin level is normal. MRI abdomen did not show any pancreatic tumor.   Assessment & Plan:   Principal Problem:   Cardiac arrest [facility]) Active Problems:   ESRD (end stage renal disease) (HCC)   Hyperkalemia   Hypoglycemia   Pancytopenia (HCC)   Anisocoria   HFrEF (heart failure with reduced ejection fraction) (HCC)   Hypothermia not due to cold exposure   Junctional bradycardia  PEA cardiac arrest: Secondary to hypoglycemia and hypothermia.  Apparently was not eating well. Echocardiogram with ejection fraction 50 to 55%, left ventricular global hypokinesis, severe LVH. Seen by cardiology.  Recommended no further invasive testing. Resumed beta-blockers.  Hypothermia secondary to hypoglycemia: Probably due to poor oral intake and absorption. C-peptide elevated.  Insulin and proinsulin levels were normal.  With normal insulin level and normal MRI of the pancreas, suspicion of  insulin secreting tumor is low. Previously on IV dextrose.  Now appetite has improved and eating regular diet. Encourage oral intake, increase carbohydrate intake, scheduled oral dextrose . IV dextrose only for symptomatic hypoglycemia. Blood sugars are stabilized last 48 hours.  Acute metabolic encephalopathy, toxic metabolic encephalopathy with myoclonus: Due to hypoglycemia. Nonfocal.  Clinically improved. Patient had developed some impulsiveness and agitation that has improved now.  Acute on chronic hypercapnic respiratory failure: Status post intubation and extubation.  Improved.  On room air today.  ESRD on hemodialysis: Followed by nephrology.  On dialysis schedule.  He is on TTS schedule.  Paroxysmal A. fib: Rate controlled.  Sinus rhythm.  Patient on amiodarone and metoprolol.   Followed by cardiology.  Severe GI bleeding in the past, not on any anticoagulation. Lower extremity duplex negative for DVT. On beta-blockers now.  Already sinus rhythm, amiodarone discontinued.  Chronic systolic heart failure: Patient on metoprolol.  Euvolemic.  Physical debility and deconditioning: Followed by PT OT.  Recommended SNF.  Medically stable.  Chronic GI bleed: Patient does have chronic intermittent GI bleed.  EGD, colonoscopy, capsule endoscopy completed within 6 months with evidence of AVM.  Will need repeat testing for any profuse rectal bleeding, otherwise symptomatic treatment.  Blood transfusion for hemoglobin less than 8. Not a candidate for anticoagulation.    DVT prophylaxis: SCDs Start: 04/03/21 0130   Code Status: Full code Family Communication: None Disposition Plan: Status is: Inpatient  Remains inpatient appropriate because:IV treatments appropriate due to intensity of illness or inability to take PO and Inpatient level of care appropriate due to severity of illness  Dispo: The patient is from: Home  Anticipated d/c is to: SNF.              Patient  currently is currently medically stable for rehab level of care.   Difficult to place patient No         Consultants:  PCCM Cardiology Neurology  Procedures:  None none  Antimicrobials:  None   Subjective: Patient seen and examined.  Overnight events noted.  He became slightly impulsive and trying to get out of bed. To me he denies any issue.  He tells me that he has to keep up with the court date and he thinks that is today.  Is very anxious about keeping of the court date.  Apparently, he had a court date last week that he missed. Blood sugar has remained stable.  Leg pain is better.  Objective: Vitals:   04/11/21 2016 04/11/21 2023 04/11/21 2156 04/12/21 1148  BP: (!) 173/83   (!) 148/85  Pulse: 81   77  Resp: 15   19  Temp: 98.4 F (36.9 C)   98.3 F (36.8 C)  TempSrc: Oral   Oral  SpO2: 100% 99% 100% 100%  Weight:      Height:        Intake/Output Summary (Last 24 hours) at 04/12/2021 1348 Last data filed at 04/11/2021 1947 Gross per 24 hour  Intake 240 ml  Output --  Net 240 ml   Filed Weights   04/10/21 0910 04/10/21 1255 04/11/21 0418  Weight: 77.6 kg 75 kg 75.1 kg    Examination:  General: Looks fairly comfortable.  Frail and debilitated.  Not in any distress.  On room air. Cardiovascular: S1-S2 normal.  Regular rate rhythm.  No added sounds. Respiratory: Bilateral clear.  No added sounds. Gastrointestinal: Soft.  Nontender. Ext: Mild tenderness right anterior leg.  No palpable fracture.  No other swelling or cyanosis. Neuro: Alert and oriented x2-3.  Slightly impulsive.  Easily reoriented.     Data Reviewed: I have personally reviewed following labs and imaging studies  CBC: Recent Labs  Lab 04/06/21 0801 04/07/21 0135 04/08/21 1418 04/10/21 0819  WBC 4.5 3.5* 3.5* 2.9*  HGB 9.3* 9.1* 8.9* 8.7*  HCT 28.5* 28.1* 28.1* 27.4*  MCV 100.7* 101.4* 102.9* 103.4*  PLT 75* 74* 87* 102*   Basic Metabolic Panel: Recent Labs  Lab  04/06/21 0801 04/07/21 0135 04/08/21 1418 04/09/21 1200 04/09/21 1611 04/09/21 2023 04/10/21 0405 04/10/21 0819  NA 126* 129* 130*  --   --   --   --  133*  K 4.9 4.4 4.4  --   --   --   --  3.9  CL 89* 93* 92*  --   --   --   --  97*  CO2 22 25 24   --   --   --   --  27  GLUCOSE 102* 112* 101* 90 77 107* 88 90  BUN 75* 34* 52*  --   --   --   --  32*  CREATININE 12.53* 7.72* 10.05*  --   --   --   --  8.07*  CALCIUM 8.4* 8.4* 9.0  --   --   --   --  9.5  MG 2.1  --   --   --   --   --   --   --   PHOS  --   --  8.2*  --   --   --   --  5.5*  GFR: Estimated Creatinine Clearance: 8.3 mL/min (A) (by C-G formula based on SCr of 8.07 mg/dL (H)). Liver Function Tests: Recent Labs  Lab 04/08/21 1418 04/10/21 0819  ALBUMIN 2.5* 2.6*   No results for input(s): LIPASE, AMYLASE in the last 168 hours.  No results for input(s): AMMONIA in the last 168 hours. Coagulation Profile: No results for input(s): INR, PROTIME in the last 168 hours. Cardiac Enzymes: No results for input(s): CKTOTAL, CKMB, CKMBINDEX, TROPONINI in the last 168 hours.  BNP (last 3 results) No results for input(s): PROBNP in the last 8760 hours. HbA1C: No results for input(s): HGBA1C in the last 72 hours. CBG: Recent Labs  Lab 04/11/21 1525 04/11/21 2130 04/12/21 0113 04/12/21 0748 04/12/21 1146  GLUCAP 92 91 90 74 93   Lipid Profile: No results for input(s): CHOL, HDL, LDLCALC, TRIG, CHOLHDL, LDLDIRECT in the last 72 hours. Thyroid Function Tests: No results for input(s): TSH, T4TOTAL, FREET4, T3FREE, THYROIDAB in the last 72 hours. Anemia Panel: No results for input(s): VITAMINB12, FOLATE, FERRITIN, TIBC, IRON, RETICCTPCT in the last 72 hours. Sepsis Labs: No results for input(s): PROCALCITON, LATICACIDVEN in the last 168 hours.   Recent Results (from the past 240 hour(s))  Resp Panel by RT-PCR (Flu A&B, Covid) Nasopharyngeal Swab     Status: None   Collection Time: 04/03/21 12:26 AM    Specimen: Nasopharyngeal Swab; Nasopharyngeal(NP) swabs in vial transport medium  Result Value Ref Range Status   SARS Coronavirus 2 by RT PCR NEGATIVE NEGATIVE Final    Comment: (NOTE) SARS-CoV-2 target nucleic acids are NOT DETECTED.  The SARS-CoV-2 RNA is generally detectable in upper respiratory specimens during the acute phase of infection. The lowest concentration of SARS-CoV-2 viral copies this assay can detect is 138 copies/mL. A negative result does not preclude SARS-Cov-2 infection and should not be used as the sole basis for treatment or other patient management decisions. A negative result may occur with  improper specimen collection/handling, submission of specimen other than nasopharyngeal swab, presence of viral mutation(s) within the areas targeted by this assay, and inadequate number of viral copies(<138 copies/mL). A negative result must be combined with clinical observations, patient history, and epidemiological information. The expected result is Negative.  Fact Sheet for Patients:  EntrepreneurPulse.com.au  Fact Sheet for Healthcare Providers:  IncredibleEmployment.be  This test is no t yet approved or cleared by the [place] FDA and  has been authorized for detection and/or diagnosis of SARS-CoV-2 by FDA under an Emergency Use Authorization (EUA). This EUA will remain  in effect (meaning this test can be used) for the duration of the COVID-19 declaration under Section 564(b)(1) of the Act, 21 U.S.C.section 360bbb-3(b)(1), unless the authorization is terminated  or revoked sooner.       Influenza A by PCR NEGATIVE NEGATIVE Final   Influenza B by PCR NEGATIVE NEGATIVE Final    Comment: (NOTE) The Xpert Xpress SARS-CoV-2/FLU/RSV plus assay is intended as an aid in the diagnosis of influenza from Nasopharyngeal swab specimens and should not be used as a sole basis for treatment. Nasal washings and aspirates are  unacceptable for Xpert Xpress SARS-CoV-2/FLU/RSV testing.  Fact Sheet for Patients: EntrepreneurPulse.com.au  Fact Sheet for Healthcare Providers: IncredibleEmployment.be  This test is not yet approved or cleared by the [place] FDA and has been authorized for detection and/or diagnosis of SARS-CoV-2 by FDA under an Emergency Use Authorization (EUA). This EUA will remain in effect (meaning this test can be used) for the duration of the COVID-19  declaration under Section 564(b)(1) of the Act, 21 U.S.C. section 360bbb-3(b)(1), unless the authorization is terminated or revoked.  Performed at [facility], [address], [place], [place] [address]   Culture, blood (routine x 2)     Status: None   Collection Time: 04/03/21  1:45 AM   Specimen: BLOOD RIGHT ARM  Result Value Ref Range Status   Specimen Description BLOOD RIGHT ARM  Final   Special Requests   Final    BOTTLES DRAWN AEROBIC AND ANAEROBIC Blood Culture adequate volume   Culture   Final    NO GROWTH 5 DAYS Performed at [facility], [address], [place], [place] [address]    Report Status 04/08/2021 FINAL  Final  MRSA Next Gen by PCR, Nasal     Status: None   Collection Time: 04/03/21  2:41 AM   Specimen: Nasal Mucosa; Nasal Swab  Result Value Ref Range Status   MRSA by PCR Next Gen NOT DETECTED NOT DETECTED Final    Comment: (NOTE) The GeneXpert MRSA Assay (FDA approved for NASAL specimens only), is one component of a comprehensive MRSA colonization surveillance program. It is not intended to diagnose MRSA infection nor to guide or monitor treatment for MRSA infections. Test performance is not FDA approved in patients less than 22 years old. Performed at [facility], [place] [address], [place], [place] [address]   Culture, blood (routine x 2)     Status: None   Collection Time: 04/03/21  5:41 AM   Specimen: BLOOD RIGHT HAND  Result Value Ref Range  Status   Specimen Description BLOOD RIGHT HAND  Final   Special Requests AEROBIC BOTTLE ONLY Blood Culture adequate volume  Final   Culture   Final    NO GROWTH 5 DAYS Performed at [facility], [place] [address] [place], [place] [address]    Report Status 04/08/2021 FINAL  Final         Radiology Studies: No results found.      Scheduled Meds:  amLODipine  5 mg Oral QHS   budesonide (PULMICORT) nebulizer solution  0.25 mg Nebulization BID   Chlorhexidine Gluconate Cloth  6 each Topical Q0600   cinacalcet  180 mg Oral Q T,Th,Sa-HD   [START ON 04/13/2021] darbepoetin (ARANESP) injection - DIALYSIS  60 mcg Intravenous Q Tue-HD   dextrose  1 Tube Oral QID   feeding supplement (NEPRO CARB STEADY)  237 mL Oral BID BM   folic acid  1 mg Oral Daily   metoprolol tartrate  50 mg Oral BID   multivitamin  1 tablet Oral QHS   pantoprazole  40 mg Oral QHS   QUEtiapine  100 mg Oral QHS   sevelamer carbonate  3,200 mg Oral TID WC   thiamine  100 mg Oral Daily   Continuous Infusions:  sodium chloride Stopped (04/05/21 2244)     LOS: 9 days    Time spent: 30 minutes.    Barb Merino, MD [facility] Pager [phone]

## 2021-04-12 NOTE — Progress Notes (Signed)
Mobility Specialist Progress Note:   04/12/21 1640  Therapy Vitals  Pulse Rate 75  Mobility  Activity Ambulated in hall  Level of Assistance Contact guard assist, steadying assist  Assistive Device Front wheel walker  Distance Ambulated (ft) 240 ft  Mobility Ambulated with assistance in hallway  Mobility Response Tolerated well  Mobility performed by Mobility specialist  Transport method Ambulatory  $Mobility charge 1 Mobility   Pre Mobility: HR 75 bpm; SpO2 100% During Mobility: HR 83 bpm; SpO2 90% Post Mobility: HR 74 bpm; SpO2 100%  Pt was received in chair and agreed to mobility. Pt c/o bilateral foot/ankle pain. Ambulated in hallway 240' with RW and contact G for occasional steadying assist. Pt stated he was SOB d/t COPD. SpO2 ranged from 90%-100% on RA during ambulation. Pt was left in bed with all needs met and NT present.   Nelta Numbers Mobility Specialist  Phone [phone]

## 2021-04-12 NOTE — Progress Notes (Signed)
[facility] Progress Note   Subjective:  Seen in room. No CP/dyspnea. Flat affect, pleasant. Reminded to make sure eating all his meals.  Objective Vitals:   04/11/21 1940 04/11/21 2016 04/11/21 2023 04/11/21 2156  BP: (!) 176/79 (!) 173/83    Pulse: 75 81    Resp: 17 15    Temp: 98.2 F (36.8 C) 98.4 F (36.9 C)    TempSrc: Oral Oral    SpO2: 100% 100% 99% 100%  Weight:      Height:       Physical Exam General: Well appearing man, NAD. Room air. Heart: RRR; no murmur Lungs: CTAB Abdomen: soft Extremities: Trace LLE edema Dialysis Access: L forearm AVF + bruit  Additional Objective Labs: Basic Metabolic Panel: Recent Labs  Lab 04/05/21 1134 04/06/21 0801 04/07/21 0135 04/08/21 1418 04/09/21 1200 04/09/21 2023 04/10/21 0405 04/10/21 0819  NA 135   < > 129* 130*  --   --   --  133*  K 5.1   < > 4.4 4.4  --   --   --  3.9  CL 94*   < > 93* 92*  --   --   --  97*  CO2 24   < > 25 24  --   --   --  27  GLUCOSE 92   < > 112* 101*   < > 107* 88 90  BUN 69*   < > 34* 52*  --   --   --  32*  CREATININE 12.35*   < > 7.72* 10.05*  --   --   --  8.07*  CALCIUM 8.4*   < > 8.4* 9.0  --   --   --  9.5  PHOS 8.6*  --   --  8.2*  --   --   --  5.5*   < > = values in this interval not displayed.   Liver Function Tests: Recent Labs  Lab 04/05/21 1134 04/08/21 1418 04/10/21 0819  AST 18  --   --   ALT 20  --   --   ALKPHOS 51  --   --   BILITOT 1.0  --   --   PROT 6.7  --   --   ALBUMIN 2.6* 2.5* 2.6*   CBC: Recent Labs  Lab 04/05/21 1134 04/06/21 0801 04/07/21 0135 04/08/21 1418 04/10/21 0819  WBC 5.0 4.5 3.5* 3.5* 2.9*  HGB 9.7* 9.3* 9.1* 8.9* 8.7*  HCT 30.3* 28.5* 28.1* 28.1* 27.4*  MCV 102.0* 100.7* 101.4* 102.9* 103.4*  PLT 69* 75* 74* 87* 112*   Medications:  sodium chloride Stopped (04/05/21 2244)   iron sucrose Stopped (04/10/21 1245)    amLODipine  5 mg Oral QHS   budesonide (PULMICORT) nebulizer solution  0.25 mg Nebulization  BID   Chlorhexidine Gluconate Cloth  6 each Topical Q0600   cinacalcet  180 mg Oral Q T,Th,Sa-HD   [START ON 04/13/2021] darbepoetin (ARANESP) injection - DIALYSIS  60 mcg Intravenous Q Tue-HD   dextrose  1 Tube Oral QID   feeding supplement (NEPRO CARB STEADY)  237 mL Oral BID BM   folic acid  1 mg Oral Daily   metoprolol tartrate  50 mg Oral BID   multivitamin  1 tablet Oral QHS   pantoprazole  40 mg Oral QHS   QUEtiapine  100 mg Oral QHS   sevelamer carbonate  3,200 mg Oral TID WC   thiamine  100 mg Oral  Daily    Dialysis Orders: TTS - [facility] 4hrs, BFR 450, DFR Auto flow 1.5,  EDW 69kg, 2K/ 2Ca - Sensipar 180mg  with HD - Calcitriol 49mcg (recently raised) with HD-last dose (2.45mcg) on 03/25/21 - Venofer 100mg  IV X 10 doses-hasn't been given yet.  Assessment/Plan: Acute encephalopathy: Improved now, felt d/t hypoglycemia and s/p cardiac arrest with element of hypoxic/anoxic brain injury.  Hypoglycemia: Ongoing issue this admit, requiring frequent D50/oral glucose. C-peptide levels high, MRI abdomen negative for pancreatic tumor. ESRD: Continue HD on TTS schedule - next tomorrow. Anemia: Hgb 8.7, resuming Aranesp here (will give tomorrow). MRI showed iron deposition in liver/spleen - will d/c IV iron. Secondary hyperparathyroidism: Phos improving, currently on Renvela 800 mg 4 with meals. CorrCa high - VDRA on hold, continue PO sensipar. HTN/volume: BP high, restarted amlodipine + metoprolol. Nutrition: Alb 2.6, on multiple supplements.   Veneta Penton, PA-C 04/12/2021, 10:45 AM  [employer]

## 2021-04-12 NOTE — Care Management Important Message (Signed)
Important Message  Patient Details  Name: Raymond [name] MRN: [id number] Date of Birth: [birth year]   Medicare Important Message Given:  Yes     Shelda Altes 04/12/2021, 11:44 AM

## 2021-04-13 LAB — CBC
HCT: 22.5 % — ABNORMAL LOW (ref 39.0–52.0)
HCT: 22.2 % — ABNORMAL LOW (ref 39.0–52.0)
HCT: 22.9 % — ABNORMAL LOW (ref 39.0–52.0)
Hemoglobin: 7.1 g/dL — ABNORMAL LOW (ref 13.0–17.0)
Hemoglobin: 6.7 g/dL — CL (ref 13.0–17.0)
Hemoglobin: 7.1 g/dL — ABNORMAL LOW (ref 13.0–17.0)
MCH: 32.9 pg (ref 26.0–34.0)
MCH: 32.1 pg (ref 26.0–34.0)
MCH: 32.3 pg (ref 26.0–34.0)
MCHC: 31.6 g/dL (ref 30.0–36.0)
MCHC: 30.2 g/dL (ref 30.0–36.0)
MCHC: 31 g/dL (ref 30.0–36.0)
MCV: 104.2 fL — ABNORMAL HIGH (ref 80.0–100.0)
MCV: 104.1 fL — ABNORMAL HIGH (ref 80.0–100.0)
MCV: 106.2 fL — ABNORMAL HIGH (ref 80.0–100.0)
Platelets: 147 10*3/uL — ABNORMAL LOW (ref 150–400)
Platelets: 139 10*3/uL — ABNORMAL LOW (ref 150–400)
Platelets: 145 10*3/uL — ABNORMAL LOW (ref 150–400)
RBC: 2.16 MIL/uL — ABNORMAL LOW (ref 4.22–5.81)
RBC: 2.09 MIL/uL — ABNORMAL LOW (ref 4.22–5.81)
RBC: 2.2 MIL/uL — ABNORMAL LOW (ref 4.22–5.81)
RDW: 17.2 % — ABNORMAL HIGH (ref 11.5–15.5)
RDW: 17.3 % — ABNORMAL HIGH (ref 11.5–15.5)
RDW: 17.3 % — ABNORMAL HIGH (ref 11.5–15.5)
WBC: 4.3 10*3/uL (ref 4.0–10.5)
WBC: 3.8 10*3/uL — ABNORMAL LOW (ref 4.0–10.5)
WBC: 3.9 10*3/uL — ABNORMAL LOW (ref 4.0–10.5)
nRBC: 0 % (ref 0.0–0.2)
nRBC: 0 % (ref 0.0–0.2)
nRBC: 0 % (ref 0.0–0.2)

## 2021-04-13 LAB — RENAL FUNCTION PANEL
Albumin: 2.4 g/dL — ABNORMAL LOW (ref 3.5–5.0)
Albumin: 2.5 g/dL — ABNORMAL LOW (ref 3.5–5.0)
Anion gap: 10 (ref 5–15)
Anion gap: 10 (ref 5–15)
BUN: 48 mg/dL — ABNORMAL HIGH (ref 8–23)
BUN: 47 mg/dL — ABNORMAL HIGH (ref 8–23)
CO2: 26 mmol/L (ref 22–32)
CO2: 26 mmol/L (ref 22–32)
Calcium: 9.4 mg/dL (ref 8.9–10.3)
Calcium: 9.5 mg/dL (ref 8.9–10.3)
Chloride: 97 mmol/L — ABNORMAL LOW (ref 98–111)
Chloride: 98 mmol/L (ref 98–111)
Creatinine, Ser: 9.05 mg/dL — ABNORMAL HIGH (ref 0.61–1.24)
Creatinine, Ser: 8.9 mg/dL — ABNORMAL HIGH (ref 0.61–1.24)
GFR, Estimated: 6 mL/min — ABNORMAL LOW (ref >60)
GFR, Estimated: 6 mL/min — ABNORMAL LOW (ref >60)
Glucose, Bld: 79 mg/dL (ref 70–99)
Glucose, Bld: 76 mg/dL (ref 70–99)
Phosphorus: 3.5 mg/dL (ref 2.5–4.6)
Phosphorus: 3.5 mg/dL (ref 2.5–4.6)
Potassium: 4.4 mmol/L (ref 3.5–5.1)
Potassium: 4.5 mmol/L (ref 3.5–5.1)
Sodium: 133 mmol/L — ABNORMAL LOW (ref 135–145)
Sodium: 134 mmol/L — ABNORMAL LOW (ref 135–145)

## 2021-04-13 LAB — GLUCOSE, CAPILLARY
Glucose-Capillary: 129 mg/dL — ABNORMAL HIGH (ref 70–99)
Glucose-Capillary: 156 mg/dL — ABNORMAL HIGH (ref 70–99)
Glucose-Capillary: 55 mg/dL — ABNORMAL LOW (ref 70–99)
Glucose-Capillary: 60 mg/dL — ABNORMAL LOW (ref 70–99)
Glucose-Capillary: 64 mg/dL — ABNORMAL LOW (ref 70–99)
Glucose-Capillary: 81 mg/dL (ref 70–99)
Glucose-Capillary: 81 mg/dL (ref 70–99)
Glucose-Capillary: 84 mg/dL (ref 70–99)

## 2021-04-13 LAB — PREPARE RBC (CROSSMATCH)

## 2021-04-13 MED ORDER — DARBEPOETIN ALFA 150 MCG/0.3ML IJ SOSY
150.0000 ug | PREFILLED_SYRINGE | INTRAMUSCULAR | Status: DC
  Administered 2021-04-13: 150 ug via INTRAVENOUS
  Filled 2021-04-13: qty 0.3

## 2021-04-13 MED ORDER — ACETAMINOPHEN 325 MG PO TABS
650.0000 mg | ORAL_TABLET | Freq: Once | ORAL | Status: AC
  Administered 2021-04-13: 650 mg via ORAL
  Filled 2021-04-13: qty 2

## 2021-04-13 MED ORDER — ACETAMINOPHEN 325 MG PO TABS
325.0000 mg | ORAL_TABLET | Freq: Once | ORAL | Status: AC
  Administered 2021-04-13: 325 mg via ORAL

## 2021-04-13 MED ORDER — ACETAMINOPHEN 325 MG PO TABS
650.0000 mg | ORAL_TABLET | Freq: Four times a day (QID) | ORAL | Status: DC | PRN
  Administered 2021-04-13 – 2021-04-14 (×2): 650 mg via ORAL
  Filled 2021-04-13 (×3): qty 2

## 2021-04-13 NOTE — Progress Notes (Signed)
PROGRESS NOTE    Raymond TOOKER  YIR:[id number] DOB: 11-07-1955 DOA: 04/03/2021 PCP: [name], FNP    Brief Narrative:  65 year old gentleman with history of ESRD on hemodialysis, pancytopenia, chronic systolic heart failure, hypertension, coronary arteries, peripheral arterial disease, COPD, hepatitis C treated presented to emergency room on 9/24 with altered mental status.  A well check was requested by family member.  Patient was found at home unresponsive and hypoglycemic with blood sugars 22.  Subsequently experienced PEA arrest in the field.  CPR and ROSC achieved in 20 minutes. He was hypothermic.  Intubated and admitted to ICU.  This is the third admission for this patient with hypoglycemia in the hospital. Admitted and discharged 6/3 with altered mental status and hypoglycemia Admitted and discharged 7/13 with altered mental status and hypoglycemia. This time with severe hypoglycemia and hypothermia causing cardiac arrest. C-peptide elevated.  Did not respond to octreotide.  Insulin and pro insulin level is normal. MRI abdomen did not show any pancreatic tumor.  Blood sugars stabilized with a scheduled doses of oral dextrose.  Getting ready to go to SNF. 10/4, overnight low-grade temperature with no obvious source of infection.  Holding off on discharge for next 24 hours.   Assessment & Plan:   Principal Problem:   Cardiac arrest [facility]) Active Problems:   ESRD (end stage renal disease) (HCC)   Hyperkalemia   Hypoglycemia   Pancytopenia (HCC)   Anisocoria   HFrEF (heart failure with reduced ejection fraction) (HCC)   Hypothermia not due to cold exposure   Junctional bradycardia  PEA cardiac arrest: Secondary to hypoglycemia and hypothermia.  Apparently was not eating well. Echocardiogram with ejection fraction 50 to 55%, left ventricular global hypokinesis, severe LVH. Seen by cardiology.  Recommended no further invasive testing. Resumed beta-blockers.  Hypothermia  secondary to hypoglycemia: Probably due to poor oral intake and absorption. C-peptide elevated.  Insulin and proinsulin levels were normal.  With normal insulin level and normal MRI of the pancreas, suspicion of insulin secreting tumor is low. Previously on IV dextrose.  Now appetite has improved and eating regular diet. Encourage oral intake, increase carbohydrate intake, scheduled oral dextrose . IV dextrose only for symptomatic hypoglycemia. Blood sugars are stabilized now.  He has not needed any dextrose injections for the last 3 days.  He does have disparity between Accu-Cheks and serum glucose but on his presentation he was encephalopathic and hypothermic due to hypoglycemia.  Acute metabolic encephalopathy, toxic metabolic encephalopathy with myoclonus: Due to hypoglycemia. Nonfocal.  Clinically improved.  Mental status has improved.  Acute on chronic hypercapnic respiratory failure: Status post intubation and extubation.  Improved.  On room air today.  ESRD on hemodialysis: Followed by nephrology.  On dialysis schedule.  He is on TTS schedule.  Paroxysmal A. fib: Rate controlled.  Sinus rhythm.  Patient on amiodarone and metoprolol.   Followed by cardiology.  Severe GI bleeding in the past, not on any anticoagulation. Lower extremity duplex negative for DVT. On beta-blockers now.  Already sinus rhythm, amiodarone discontinued.  Chronic systolic heart failure: Patient on metoprolol.  Euvolemic.  Physical debility and deconditioning: Followed by PT OT.  Recommended SNF.  Medically stable.  Anemia, acute on chronic chronic GI bleed: Patient does have chronic intermittent GI bleed.  EGD, colonoscopy, capsule endoscopy completed within 6 months with evidence of AVM.   Hemoglobin gradually drifted down.  6.7 today.  1 unit PRBC transfusion with hemodialysis. Patient does not have any treatment option available unless  there is a frank bleeding. Frequent hemoglobin monitoring and transfusion  will benefit him. He is not a good candidate to go on any anticoagulation.  Low-grade fever: Patient developed low-grade temperature overnight.  Also hemoglobin 6.7.  No obvious source of infection.  Blood cultures drawn.  We will keep in the hospital because of unknown source of possible infection to rule out any bacteremia.  Not using any antibiotics.   DVT prophylaxis: SCDs Start: 04/03/21 0130   Code Status: Full code Family Communication: None Disposition Plan: Status is: Inpatient  Remains inpatient appropriate because:IV treatments appropriate due to intensity of illness or inability to take PO and Inpatient level of care appropriate due to severity of illness  Dispo: The patient is from: Home              Anticipated d/c is to: SNF.              Patient currently is currently not a stable.  Anticipate tomorrow.   Difficult to place patient No   Continue to mobilize.      Consultants:  PCCM Cardiology Neurology  Procedures:  None none  Antimicrobials:  None   Subjective: Patient seen and examined.  Overnight events noted.  He was getting dialysis.  He had some headache.  He does have some pain on his right shin where he had bumped.  Overnight low-grade temperature, blood cultures drawn.  Currently afebrile.  Objective: Vitals:   04/13/21 1030 04/13/21 1035 04/13/21 1141 04/13/21 1200  BP: (!) 144/79  (!) 148/76 (!) 167/89  Pulse:   75 (!) 20  Resp: 19 20 20  (!) 27  Temp: 97.9 F (36.6 C)  97.6 F (36.4 C)   TempSrc: Oral  Oral   SpO2: 100%   90%  Weight: 76.5 kg     Height:        Intake/Output Summary (Last 24 hours) at 04/13/2021 1347 Last data filed at 04/13/2021 1030 Gross per 24 hour  Intake 555 ml  Output 2300 ml  Net -1745 ml   Filed Weights   04/13/21 0442 04/13/21 0657 04/13/21 1030  Weight: 78.9 kg 78.9 kg 76.5 kg    Examination:  General: Looks fairly comfortable.  Frail and debilitated.  Not in any distress.  On room  air. Cardiovascular: S1-S2 normal.  Regular rate rhythm.  No added sounds. Respiratory: Bilateral clear.  No added sounds. Gastrointestinal: Soft.  Nontender. Ext: Mild tenderness right anterior leg with ruptured blister.  No palpable fracture.  No other swelling or cyanosis. Neuro: Alert and oriented x2-3.       Data Reviewed: I have personally reviewed following labs and imaging studies  CBC: Recent Labs  Lab 04/08/21 1418 04/10/21 0819 04/13/21 0221 04/13/21 0641 04/13/21 0700  WBC 3.5* 2.9* 4.3 3.9* 3.8*  HGB 8.9* 8.7* 7.1* 7.1* 6.7*  HCT 28.1* 27.4* 22.5* 22.9* 22.2*  MCV 102.9* 103.4* 104.2* 104.1* 106.2*  PLT 87* 112* 147* 145* 341*   Basic Metabolic Panel: Recent Labs  Lab 04/07/21 0135 04/08/21 1418 04/09/21 1200 04/09/21 2023 04/10/21 0405 04/10/21 0819 04/13/21 0641 04/13/21 0700  NA 129* 130*  --   --   --  133* 133* 134*  K 4.4 4.4  --   --   --  3.9 4.4 4.5  CL 93* 92*  --   --   --  97* 97* 98  CO2 25 24  --   --   --  27 26 26   GLUCOSE 112*  101*   < > 107* 88 90 79 76  BUN 34* 52*  --   --   --  32* 48* 47*  CREATININE 7.72* 10.05*  --   --   --  8.07* 9.05* 8.90*  CALCIUM 8.4* 9.0  --   --   --  9.5 9.4 9.5  PHOS  --  8.2*  --   --   --  5.5* 3.5 3.5   < > = values in this interval not displayed.   GFR: Estimated Creatinine Clearance: 7.5 mL/min (A) (by C-G formula based on SCr of 8.9 mg/dL (H)). Liver Function Tests: Recent Labs  Lab 04/08/21 1418 04/10/21 0819 04/13/21 0641 04/13/21 0700  ALBUMIN 2.5* 2.6* 2.4* 2.5*   No results for input(s): LIPASE, AMYLASE in the last 168 hours.  No results for input(s): AMMONIA in the last 168 hours. Coagulation Profile: No results for input(s): INR, PROTIME in the last 168 hours. Cardiac Enzymes: No results for input(s): CKTOTAL, CKMB, CKMBINDEX, TROPONINI in the last 168 hours.  BNP (last 3 results) No results for input(s): PROBNP in the last 8760 hours. HbA1C: No results for input(s):  HGBA1C in the last 72 hours. CBG: Recent Labs  Lab 04/13/21 0550 04/13/21 1144 04/13/21 1208 04/13/21 1226 04/13/21 1250  GLUCAP 81 60* 64* 55* 156*   Lipid Profile: No results for input(s): CHOL, HDL, LDLCALC, TRIG, CHOLHDL, LDLDIRECT in the last 72 hours. Thyroid Function Tests: No results for input(s): TSH, T4TOTAL, FREET4, T3FREE, THYROIDAB in the last 72 hours. Anemia Panel: No results for input(s): VITAMINB12, FOLATE, FERRITIN, TIBC, IRON, RETICCTPCT in the last 72 hours. Sepsis Labs: No results for input(s): PROCALCITON, LATICACIDVEN in the last 168 hours.   Recent Results (from the past 240 hour(s))  SARS CORONAVIRUS 2 (TAT 6-24 HRS) Nasopharyngeal Nasopharyngeal Swab     Status: None   Collection Time: 04/12/21 11:22 AM   Specimen: Nasopharyngeal Swab  Result Value Ref Range Status   SARS Coronavirus 2 NEGATIVE NEGATIVE Final    Comment: (NOTE) SARS-CoV-2 target nucleic acids are NOT DETECTED.  The SARS-CoV-2 RNA is generally detectable in upper and lower respiratory specimens during the acute phase of infection. Negative results do not preclude SARS-CoV-2 infection, do not rule out co-infections with other pathogens, and should not be used as the sole basis for treatment or other patient management decisions. Negative results must be combined with clinical observations, patient history, and epidemiological information. The expected result is Negative.  Fact Sheet for Patients: SugarRoll.be  Fact Sheet for Healthcare Providers: https://www.woods-mathews.com/  This test is not yet approved or cleared by the [place] FDA and  has been authorized for detection and/or diagnosis of SARS-CoV-2 by FDA under an Emergency Use Authorization (EUA). This EUA will remain  in effect (meaning this test can be used) for the duration of the COVID-19 declaration under Se ction 564(b)(1) of the Act, 21 U.S.C. section 360bbb-3(b)(1),  unless the authorization is terminated or revoked sooner.  Performed at [facility], [place] [address] [place], [place] [address]          Radiology Studies: No results found.      Scheduled Meds:  amLODipine  5 mg Oral QHS   budesonide (PULMICORT) nebulizer solution  0.25 mg Nebulization BID   cinacalcet  180 mg Oral Q T,Th,Sa-HD   darbepoetin (ARANESP) injection - DIALYSIS  150 mcg Intravenous Q Tue-HD   dextrose  1 Tube Oral QID   feeding supplement (NEPRO CARB  STEADY)  237 mL Oral BID BM   folic acid  1 mg Oral Daily   metoprolol tartrate  50 mg Oral BID   multivitamin  1 tablet Oral QHS   pantoprazole  40 mg Oral QHS   QUEtiapine  100 mg Oral QHS   sevelamer carbonate  3,200 mg Oral TID WC   thiamine  100 mg Oral Daily   Continuous Infusions:  sodium chloride Stopped (04/05/21 2244)     LOS: 10 days    Time spent: 30 minutes.    Barb Merino, MD [facility] Pager [phone]

## 2021-04-13 NOTE — Progress Notes (Signed)
OT Cancellation Note  Patient Details Name: Raymond [name] MRN: [id number] DOB: 12-12-1955   Cancelled Treatment:    Reason Eval/Treat Not Completed: Patient at procedure or test/ unavailable. Off floor for HD. OT to check back as time allows.   Gloris [place] OTR/L Supplemental OT, Department of rehab services [phone]  Jacquelinne Speak R H. 04/13/2021, 10:37 AM

## 2021-04-13 NOTE — Progress Notes (Signed)
Hemodialysis- Tolerated well without issue. 2L UF removed as ordered. Received 1 unit prbcs with dialysis as well as Aranesp. No changes from previous assessment. Remains alert to self/place. Reoriented to day/time. Report called to primary RN.

## 2021-04-13 NOTE — TOC Progression Note (Signed)
Transition of Care [facility]) - Progression Note    Patient Details  Name: Raymond [name] MRN: [id number] Date of Birth: 1956-01-01  Transition of Care [facility]) CM/SW [place], [place] Phone Number: 04/13/2021, 11:33 AM  Clinical Narrative:     Patient has SNF bed at [facility] when medically ready. Insurance authorizaztion has been approved through 10/5. Next review date 10/5. CSW will continue to follow and assist with dc planning needs.  Expected Discharge Plan: [place] Barriers to Discharge: No Barriers Identified  Expected Discharge Plan and Services Expected Discharge Plan: [place] arrangements for the past 2 months: Apartment                                       Social Determinants of Health (SDOH) Interventions    Readmission Risk Interventions Readmission Risk Prevention Plan 06/22/2020  Transportation Screening Complete  Medication Review [employer]) Complete  PCP or Specialist appointment within 3-5 days of discharge Complete  HRI or [place] Complete  SW Recovery Care/Counseling Consult Complete  [place] Patient Refused  Some recent data might be hidden

## 2021-04-13 NOTE — Progress Notes (Signed)
  Raymond [name] Progress Note   Subjective:  Seen on HD. Denies CP/dyspnea. C/o R knee pain - small blister at site of pain. Hgb 7.1 this AM again -> will plan to transfuse 1U PRBCs on HD.  Objective Vitals:   04/13/21 0800 04/13/21 0820 04/13/21 0830 04/13/21 0900  BP: 128/75 (!) 108/36 (!) 141/69 (!) 143/81  Pulse: 70  73 75  Resp: 20 (!) 21 15 (!) 22  Temp:      TempSrc:      SpO2:      Weight:      Height:       Physical Exam General: Well appearing man, NAD. Room air. Heart: RRR; no murmur Lungs: CTAB Abdomen: soft Extremities: Trace LLE edema Dialysis Access: L forearm AVF + bruit  Additional Objective Labs: Basic Metabolic Panel: Recent Labs  Lab 04/10/21 0819 04/13/21 0641 04/13/21 0700  NA 133* 133* 134*  K 3.9 4.4 4.5  CL 97* 97* 98  CO2 27 26 26   GLUCOSE 90 79 76  BUN 32* 48* 47*  CREATININE 8.07* 9.05* 8.90*  CALCIUM 9.5 9.4 9.5  PHOS 5.5* 3.5 3.5   Liver Function Tests: Recent Labs  Lab 04/10/21 0819 04/13/21 0641 04/13/21 0700  ALBUMIN 2.6* 2.4* 2.5*   CBC: Recent Labs  Lab 04/08/21 1418 04/10/21 0819 04/13/21 0221 04/13/21 0641 04/13/21 0700  WBC 3.5* 2.9* 4.3 3.9* 3.8*  HGB 8.9* 8.7* 7.1* 7.1* 6.7*  HCT 28.1* 27.4* 22.5* 22.9* 22.2*  MCV 102.9* 103.4* 104.2* 104.1* 106.2*  PLT 87* 112* 147* 145* 139*   CBG: Recent Labs  Lab 04/12/21 1146 04/12/21 1656 04/12/21 2112 04/13/21 0004 04/13/21 0550  GLUCAP 93 79 92 84 81   Medications:  sodium chloride Stopped (04/05/21 2244)    amLODipine  5 mg Oral QHS   budesonide (PULMICORT) nebulizer solution  0.25 mg Nebulization BID   cinacalcet  180 mg Oral Q T,Th,Sa-HD   darbepoetin (ARANESP) injection - DIALYSIS  60 mcg Intravenous Q Tue-HD   dextrose  1 Tube Oral QID   feeding supplement (NEPRO CARB STEADY)  237 mL Oral BID BM   folic acid  1 mg Oral Daily   metoprolol tartrate  50 mg Oral BID   multivitamin  1 tablet Oral QHS   pantoprazole  40 mg Oral QHS    QUEtiapine  100 mg Oral QHS   sevelamer carbonate  3,200 mg Oral TID WC   thiamine  100 mg Oral Daily    Dialysis Orders: TTS - [place] 4hrs, BFR 450, DFR Auto flow 1.5,  EDW 69kg, 2K/ 2Ca - Sensipar 180mg  with HD - Calcitriol 32mcg (recently raised) with HD-last dose (2.84mcg) on 03/25/21 - Venofer 100mg  IV X 10 doses-hasn't been given yet.   Assessment/Plan: Acute encephalopathy: Improved now, felt d/t hypoglycemia and s/p cardiac arrest with element of hypoxic/anoxic brain injury.  Hypoglycemia: Ongoing issue this admit, requiring frequent D50/oral glucose. C-peptide levels high, MRI abdomen negative for pancreatic tumor. ESRD: Continue HD on TTS schedule - HD now. Anemia: Hgb down to 6.7 - for 1U PRBCs today and resuming Aranesp here (giving today). MRI showed iron deposition in liver/spleen - no IV iron. Ordered stool guaiac. Secondary hyperparathyroidism: Phos stable on Renvela 800 mg 4 with meals. CorrCa high - VDRA on hold, continue PO sensipar. HTN/volume: BP better, restarted amlodipine + metoprolol. Nutrition: Alb 2.5, on multiple supplements.  Raymond Penton, PA-C 04/13/2021, 9:06 AM  [employer]

## 2021-04-13 NOTE — Plan of Care (Addendum)
HD today. 2 L off. 1 unit of prbc given intra HD session. Hypoglycemic when returned to unit. Pt miss PO dextrose 10 am dose. Glucose rechecked after meal pt remains hypoglycemic. 1 amp IV dextrose given bg 156. Dr. Sloan Leiter notified. Pt remained asymptomatic. Results for TORELL, MINDER (MRN [id number]) as of 04/13/2021 14:04  Ref. Range 04/13/2021 11:44 04/13/2021 12:08 04/13/2021 12:26 04/13/2021 12:50  Glucose-Capillary Latest Ref Range: 70 - 99 mg/dL 60 (L) 64 (L) 55 (L) 156 (H)      Problem: Education: Goal: Knowledge of General Education information will improve Description: Including pain rating scale, medication(s)/side effects and non-pharmacologic comfort measures Outcome: Progressing   Problem: Health Behavior/Discharge Planning: Goal: Ability to manage health-related needs will improve Outcome: Progressing   Problem: Clinical Measurements: Goal: Ability to maintain clinical measurements within normal limits will improve Outcome: Progressing Goal: Will remain free from infection Outcome: Progressing Goal: Diagnostic test results will improve Outcome: Progressing Goal: Respiratory complications will improve Outcome: Progressing Goal: Cardiovascular complication will be avoided Outcome: Progressing   Problem: Activity: Goal: Risk for activity intolerance will decrease Outcome: Progressing   Problem: Nutrition: Goal: Adequate nutrition will be maintained Outcome: Progressing   Problem: Coping: Goal: Level of anxiety will decrease Outcome: Progressing   Problem: Elimination: Goal: Will not experience complications related to bowel motility Outcome: Progressing Goal: Will not experience complications related to urinary retention Outcome: Progressing   Problem: Pain Managment: Goal: General experience of comfort will improve Outcome: Progressing   Problem: Safety: Goal: Ability to remain free from injury will improve Outcome: Progressing   Problem: Skin  Integrity: Goal: Risk for impaired skin integrity will decrease Outcome: Progressing

## 2021-04-13 NOTE — Progress Notes (Signed)
PT Cancellation Note  Patient Details Name: Raymond [name] MRN: [id number] DOB: May 09, 1956   Cancelled Treatment:    Reason Eval/Treat Not Completed: Patient at procedure or test/unavailable. Patient at HD, will re-attempt later today if available and time allows.    Amariah Kierstead 04/13/2021, 8:57 AM

## 2021-04-13 NOTE — Progress Notes (Signed)
PT Cancellation Note  Patient Details Name: CLAUDELL WOHLER MRN: [id number] DOB: 13-Sep-1955   Cancelled Treatment:    Reason Eval/Treat Not Completed: Pain limiting ability to participate. Patient reports headache. Does not feel up to doing anything this pm. Will re-attempt tomorrow. RN notified of headache.    Madelynn Malson 04/13/2021, 1:28 PM

## 2021-04-14 LAB — GLUCOSE, CAPILLARY
Glucose-Capillary: 118 mg/dL — ABNORMAL HIGH (ref 70–99)
Glucose-Capillary: 142 mg/dL — ABNORMAL HIGH (ref 70–99)
Glucose-Capillary: 73 mg/dL (ref 70–99)
Glucose-Capillary: 77 mg/dL (ref 70–99)
Glucose-Capillary: 81 mg/dL (ref 70–99)
Glucose-Capillary: 95 mg/dL (ref 70–99)

## 2021-04-14 LAB — COCAINE,MS,WB/SP RFX
Benzoylecgonine: >1500 ng/mL
Cocaine Confirmation: POSITIVE
Cocaine: NEGATIVE ng/mL

## 2021-04-14 LAB — CBC
HCT: 25.7 % — ABNORMAL LOW (ref 39.0–52.0)
Hemoglobin: 8.3 g/dL — ABNORMAL LOW (ref 13.0–17.0)
MCH: 32.7 pg (ref 26.0–34.0)
MCHC: 32.3 g/dL (ref 30.0–36.0)
MCV: 101.2 fL — ABNORMAL HIGH (ref 80.0–100.0)
Platelets: 156 10*3/uL (ref 150–400)
RBC: 2.54 MIL/uL — ABNORMAL LOW (ref 4.22–5.81)
RDW: 18.8 % — ABNORMAL HIGH (ref 11.5–15.5)
WBC: 3.7 10*3/uL — ABNORMAL LOW (ref 4.0–10.5)
nRBC: 0 % (ref 0.0–0.2)

## 2021-04-14 LAB — BPAM RBC
Blood Product Expiration Date: 202210172359
ISSUE DATE / TIME: 202210040956
Unit Type and Rh: 7300

## 2021-04-14 LAB — TYPE AND SCREEN
ABO/RH(D): B POS
Antibody Screen: NEGATIVE
Unit division: 0

## 2021-04-14 NOTE — Progress Notes (Signed)
Mobility Specialist Progress Note:   04/14/21 1255  Therapy Vitals  Pulse Rate 72  Mobility  Activity Ambulated in hall  Level of Assistance Minimal assist, patient does 75% or more  Assistive Device Front wheel walker  Distance Ambulated (ft) 200 ft  Mobility Ambulated with assistance in hallway  Mobility Response Tolerated well  Mobility performed by Mobility specialist  $Mobility charge 1 Mobility   Pre Mobility: HR 72 bpm During Mobility: HR 79 bpm Post Mobility: HR 73 bpm  Pt was received in bed and agreed to mobility. Voiced foot pain. Required minA to stand from EOB. Ambulated in hallway 200' with RW and contact G for stability. Tolerated well. Pt left in bed with all needs met.   Nelta Numbers Mobility Specialist  Phone [phone]

## 2021-04-14 NOTE — Progress Notes (Signed)
PROGRESS NOTE    Raymond [name]  ZOX:[id number] DOB: 23-Feb-1956 DOA: 04/03/2021 PCP: [name], FNP  Brief Narrative: 65 year old gentleman with history of ESRD on hemodialysis, pancytopenia, chronic systolic heart failure, hypertension, coronary arteries, peripheral arterial disease, COPD, hepatitis C treated presented to emergency room on 9/24 with altered mental status.  A well check was requested by family member.  Patient was found at home unresponsive and hypoglycemic with blood sugars 22.  Subsequently experienced PEA arrest in the field.  CPR and ROSC achieved in 20 minutes. He was hypothermic.  Intubated and admitted to ICU.   This is the third admission for this patient with hypoglycemia in the hospital. Admitted and discharged 6/3 with altered mental status and hypoglycemia Admitted and discharged 7/13 with altered mental status and hypoglycemia. This time with severe hypoglycemia and hypothermia causing cardiac arrest. C-peptide elevated.  Did not respond to octreotide.  Insulin and pro insulin level is normal. MRI abdomen did not show any pancreatic tumor.  Blood sugars stabilized with a scheduled doses of oral dextrose.  Getting ready to go to SNF. 10/4, overnight low-grade temperature with no obvious source of infection.  Holding off on discharge for next 24 hours.  Assessment & Plan:   Principal Problem:   Cardiac arrest [facility]) Active Problems:   ESRD (end stage renal disease) (HCC)   Hyperkalemia   Hypoglycemia   Pancytopenia (HCC)   Anisocoria   HFrEF (heart failure with reduced ejection fraction) (HCC)   Hypothermia not due to cold exposure   Junctional bradycardia  PEA cardiac arrest: Secondary to hypoglycemia and hypothermia.  Apparently was not eating well. Echocardiogram with ejection fraction 50 to 55%, left ventricular global hypokinesis, severe LVH. Seen by cardiology.  Recommended no further invasive testing. Resumed beta-blockers. Staff reported possible  third-degree heart block and bradycardia to 30s, Reconsulted cardiology.  Hypothermia secondary to hypoglycemia: Probably due to poor oral intake and absorption. C-peptide elevated.  Insulin and proinsulin levels were normal.  With normal insulin level and normal MRI of the pancreas, suspicion of insulin secreting tumor is low. Previously on IV dextrose.  Now appetite has improved and eating regular diet. Encourage oral intake, increase carbohydrate intake, scheduled oral dextrose . IV dextrose only for symptomatic hypoglycemia. Blood sugars are stabilized now.  He has not needed any dextrose injections for the last 3 days.  He does have disparity between Accu-Cheks and serum glucose but on his presentation he was encephalopathic and hypothermic due to hypoglycemia.   Acute metabolic encephalopathy, toxic metabolic encephalopathy with myoclonus: Due to hypoglycemia. Nonfocal.  Clinically improved.  Mental status has improved.  Acute on chronic hypercapnic respiratory failure: Status post intubation and extubation.  Improved.  On room air today.  ESRD on hemodialysis: Followed by nephrology.  On dialysis schedule.  He is on TTS schedule.   Paroxysmal A. fib: Rate controlled.  Sinus rhythm.  Patient on amiodarone and metoprolol.   Followed by cardiology.  Severe GI bleeding in the past, not on any anticoagulation. Lower extremity duplex negative for DVT. On beta-blockers now.  Already sinus rhythm, amiodarone discontinued.   Chronic systolic heart failure: Patient on metoprolol.  Euvolemic.   Physical debility and deconditioning: Followed by PT OT.  Recommended SNF.  Medically stable    Anemia, acute on chronic chronic GI bleed: Patient does have chronic intermittent GI bleed.  EGD, colonoscopy, capsule endoscopy completed within 6 months with evidence of AVM.   Hemoglobin gradually drifted down.  6.7 today.  1 unit PRBC transfusion with hemodialysis. Patient does not have any treatment  option available unless there is a frank bleeding. Frequent hemoglobin monitoring and transfusion will benefit him. He is not a good candidate to go on any anticoagulation.   Low-grade fever: Patient developed low-grade temperature overnight.  Also hemoglobin 6.7.  No obvious source of infection.  Blood cultures drawn.  We will keep in the hospital because of unknown source of possible infection to rule out any bacteremia.  Not using any antibiotics.     DVT prophylaxis: SCDs Start: 04/03/21 0130     Code Status: Full code Family Communication: None Disposition Plan: Status is: Inpatient   Remains inpatient appropriate because:IV treatments appropriate due to intensity of illness or inability to take PO and Inpatient level of care appropriate due to severity of illness   Dispo: The patient is from: Home              Anticipated d/c is to: SNF.              Patient currently is currently not a stable.  Anticipate tomorrow.              Difficult to place patient No     Continue to mobilize.      Nutrition Problem: Increased nutrient needs Etiology: chronic illness (ESRD on HD)     Signs/Symptoms: estimated needs    Interventions: Boost Breeze, MVI  Estimated body mass index is 26.1 kg/m as calculated from the following:   Height as of this encounter: 5\' 7"  (1.702 m).   Weight as of this encounter: 75.6 kg.  Consultants:  PCCM Cardiology Neurology   Procedures:  None none   Antimicrobials:  None  Subjective:  Patient is resting in bed he ate his breakfast completely his blood sugars are stable Staff called concerning bradycardia/third-degree heart block and telemetry EKG ordered cardiology consulted Objective: Vitals:   04/14/21 0835 04/14/21 1255 04/14/21 1610 04/14/21 1612  BP: 117/74     Pulse: 79 72    Resp: (!) 23     Temp: 98.5 F (36.9 C)  97.7 F (36.5 C) 97.7 F (36.5 C)  TempSrc: Oral  Oral Oral  SpO2: 97%     Weight:      Height:         Intake/Output Summary (Last 24 hours) at 04/14/2021 1644 Last data filed at 04/13/2021 2220 Gross per 24 hour  Intake 240 ml  Output --  Net 240 ml   Filed Weights   04/13/21 0657 04/13/21 1030 04/14/21 0504  Weight: 78.9 kg 76.5 kg 75.6 kg    Examination:  General exam: Appears calm and comfortable  Respiratory system: Clear to auscultation. Respiratory effort normal. Cardiovascular system: S1 & S2 heard, RRR. No JVD, murmurs, rubs, gallops or clicks. No pedal edema. Gastrointestinal system: Abdomen is nondistended, soft and nontender. No organomegaly or masses felt. Normal bowel sounds heard. Central nervous system: Alert and oriented. No focal neurological deficits. Extremities: Symmetric 5 x 5 power. Skin: No rashes, lesions or ulcers Psychiatry: Judgement and insight appear normal. Mood & affect appropriate.     Data Reviewed: I have personally reviewed following labs and imaging studies  CBC: Recent Labs  Lab 04/10/21 0819 04/13/21 0221 04/13/21 0641 04/13/21 0700 04/14/21 0953  WBC 2.9* 4.3 3.9* 3.8* 3.7*  HGB 8.7* 7.1* 7.1* 6.7* 8.3*  HCT 27.4* 22.5* 22.9* 22.2* 25.7*  MCV 103.4* 104.2* 104.1* 106.2* 101.2*  PLT 112* 147* 145* 139*  156   Basic Metabolic Panel: Recent Labs  Lab 04/08/21 1418 04/09/21 1200 04/09/21 2023 04/10/21 0405 04/10/21 0819 04/13/21 0641 04/13/21 0700  NA 130*  --   --   --  133* 133* 134*  K 4.4  --   --   --  3.9 4.4 4.5  CL 92*  --   --   --  97* 97* 98  CO2 24  --   --   --  27 26 26   GLUCOSE 101*   < > 107* 88 90 79 76  BUN 52*  --   --   --  32* 48* 47*  CREATININE 10.05*  --   --   --  8.07* 9.05* 8.90*  CALCIUM 9.0  --   --   --  9.5 9.4 9.5  PHOS 8.2*  --   --   --  5.5* 3.5 3.5   < > = values in this interval not displayed.   GFR: Estimated Creatinine Clearance: 7.5 mL/min (A) (by C-G formula based on SCr of 8.9 mg/dL (H)). Liver Function Tests: Recent Labs  Lab 04/08/21 1418 04/10/21 0819 04/13/21 0641  04/13/21 0700  ALBUMIN 2.5* 2.6* 2.4* 2.5*   No results for input(s): LIPASE, AMYLASE in the last 168 hours. No results for input(s): AMMONIA in the last 168 hours. Coagulation Profile: No results for input(s): INR, PROTIME in the last 168 hours. Cardiac Enzymes: No results for input(s): CKTOTAL, CKMB, CKMBINDEX, TROPONINI in the last 168 hours. BNP (last 3 results) No results for input(s): PROBNP in the last 8760 hours. HbA1C: No results for input(s): HGBA1C in the last 72 hours. CBG: Recent Labs  Lab 04/14/21 0015 04/14/21 0504 04/14/21 0808 04/14/21 1128 04/14/21 1602  GLUCAP 142* 73 118* 81 77   Lipid Profile: No results for input(s): CHOL, HDL, LDLCALC, TRIG, CHOLHDL, LDLDIRECT in the last 72 hours. Thyroid Function Tests: No results for input(s): TSH, T4TOTAL, FREET4, T3FREE, THYROIDAB in the last 72 hours. Anemia Panel: No results for input(s): VITAMINB12, FOLATE, FERRITIN, TIBC, IRON, RETICCTPCT in the last 72 hours. Sepsis Labs: No results for input(s): PROCALCITON, LATICACIDVEN in the last 168 hours.  Recent Results (from the past 240 hour(s))  SARS CORONAVIRUS 2 (TAT 6-24 HRS) Nasopharyngeal Nasopharyngeal Swab     Status: None   Collection Time: 04/12/21 11:22 AM   Specimen: Nasopharyngeal Swab  Result Value Ref Range Status   SARS Coronavirus 2 NEGATIVE NEGATIVE Final    Comment: (NOTE) SARS-CoV-2 target nucleic acids are NOT DETECTED.  The SARS-CoV-2 RNA is generally detectable in upper and lower respiratory specimens during the acute phase of infection. Negative results do not preclude SARS-CoV-2 infection, do not rule out co-infections with other pathogens, and should not be used as the sole basis for treatment or other patient management decisions. Negative results must be combined with clinical observations, patient history, and epidemiological information. The expected result is Negative.  Fact Sheet for  Patients: HairSlick.no  Fact Sheet for Healthcare Providers: quierodirigir.com  This test is not yet approved or cleared by the [place] FDA and  has been authorized for detection and/or diagnosis of SARS-CoV-2 by FDA under an Emergency Use Authorization (EUA). This EUA will remain  in effect (meaning this test can be used) for the duration of the COVID-19 declaration under Se ction 564(b)(1) of the Act, 21 U.S.C. section 360bbb-3(b)(1), unless the authorization is terminated or revoked sooner.  Performed at [facility], [address] [place],  [place] [address]   Culture, blood (routine x 2)     Status: None (Preliminary result)   Collection Time: 04/13/21  5:51 AM   Specimen: BLOOD  Result Value Ref Range Status   Specimen Description BLOOD RIGHT ANTECUBITAL  Final   Special Requests   Final    BOTTLES DRAWN AEROBIC AND ANAEROBIC Blood Culture adequate volume   Culture   Final    NO GROWTH 1 DAY Performed at [facility], [address] [place], [place] [address]    Report Status PENDING  Incomplete  Culture, blood (routine x 2)     Status: None (Preliminary result)   Collection Time: 04/13/21  6:00 AM   Specimen: BLOOD RIGHT HAND  Result Value Ref Range Status   Specimen Description BLOOD RIGHT HAND  Final   Special Requests   Final    BOTTLES DRAWN AEROBIC AND ANAEROBIC Blood Culture adequate volume   Culture   Final    NO GROWTH 1 DAY Performed at [facility], [address] [place], [place] [address]    Report Status PENDING  Incomplete         Radiology Studies: No results found.      Scheduled Meds:  amLODipine  5 mg Oral QHS   budesonide (PULMICORT) nebulizer solution  0.25 mg Nebulization BID   cinacalcet  180 mg Oral Q T,Th,Sa-HD   darbepoetin (ARANESP) injection - DIALYSIS  150 mcg Intravenous Q Tue-HD   dextrose  1 Tube Oral QID   feeding supplement (NEPRO CARB  STEADY)  237 mL Oral BID BM   folic acid  1 mg Oral Daily   metoprolol tartrate  50 mg Oral BID   multivitamin  1 tablet Oral QHS   pantoprazole  40 mg Oral QHS   QUEtiapine  100 mg Oral QHS   sevelamer carbonate  3,200 mg Oral TID WC   thiamine  100 mg Oral Daily   Continuous Infusions:  sodium chloride Stopped (04/05/21 2244)     LOS: 11 days    Time spent: 38 min    Alwyn Ren, MD  04/14/2021, 4:44 PM

## 2021-04-14 NOTE — Progress Notes (Signed)
[facility] Progress Note   Subjective:  Seen in room - denies CP or dyspnea today. Still with R shin pain. Did fine with HD yesterday and was given 1U RPBCs.  Objective Vitals:   04/14/21 0016 04/14/21 0504 04/14/21 0807 04/14/21 0835  BP: 118/79 123/62  117/74  Pulse: 68 74  79  Resp: 19 16  (!) 23  Temp: 98.7 F (37.1 C) 98.9 F (37.2 C)  98.5 F (36.9 C)  TempSrc: Oral Oral  Oral  SpO2: 100% 99% 96% 97%  Weight:  75.6 kg    Height:       Physical Exam General: Well appearing man, NAD. Room air. Heart: RRR; no murmur Lungs: CTAB Abdomen: soft Extremities: Trace LLE edema; tiny scab/blister to R shin Dialysis Access: L forearm AVF + bruit  Additional Objective Labs: Basic Metabolic Panel: Recent Labs  Lab 04/10/21 0819 04/13/21 0641 04/13/21 0700  NA 133* 133* 134*  K 3.9 4.4 4.5  CL 97* 97* 98  CO2 27 26 26   GLUCOSE 90 79 76  BUN 32* 48* 47*  CREATININE 8.07* 9.05* 8.90*  CALCIUM 9.5 9.4 9.5  PHOS 5.5* 3.5 3.5   Liver Function Tests: Recent Labs  Lab 04/10/21 0819 04/13/21 0641 04/13/21 0700  ALBUMIN 2.6* 2.4* 2.5*   CBC: Recent Labs  Lab 04/08/21 1418 04/10/21 0819 04/13/21 0221 04/13/21 0641 04/13/21 0700  WBC 3.5* 2.9* 4.3 3.9* 3.8*  HGB 8.9* 8.7* 7.1* 7.1* 6.7*  HCT 28.1* 27.4* 22.5* 22.9* 22.2*  MCV 102.9* 103.4* 104.2* 104.1* 106.2*  PLT 87* 112* 147* 145* 139*   CBG: Recent Labs  Lab 04/13/21 1613 04/13/21 2109 04/14/21 0015 04/14/21 0504 04/14/21 0808  GLUCAP 129* 81 142* 73 118*   Medications:  sodium chloride Stopped (04/05/21 2244)    amLODipine  5 mg Oral QHS   budesonide (PULMICORT) nebulizer solution  0.25 mg Nebulization BID   cinacalcet  180 mg Oral Q T,Th,Sa-HD   darbepoetin (ARANESP) injection - DIALYSIS  150 mcg Intravenous Q Tue-HD   dextrose  1 Tube Oral QID   feeding supplement (NEPRO CARB STEADY)  237 mL Oral BID BM   folic acid  1 mg Oral Daily   metoprolol tartrate  50 mg Oral BID    multivitamin  1 tablet Oral QHS   pantoprazole  40 mg Oral QHS   QUEtiapine  100 mg Oral QHS   sevelamer carbonate  3,200 mg Oral TID WC   thiamine  100 mg Oral Daily    Dialysis Orders: TTS - [place] 4hrs, BFR 450, DFR Auto flow 1.5,  EDW 69kg, 2K/ 2Ca - Sensipar 180mg  with HD - Calcitriol 87mcg (recently raised) with HD-last dose (2.39mcg) on 03/25/21 - Venofer 100mg  IV X 10 doses-hasn't been given yet.   Assessment/Plan: Acute encephalopathy: Improved now, felt d/t hypoglycemia and s/p cardiac arrest with element of hypoxic/anoxic brain injury.  Hypoglycemia: Ongoing issue this admit, requiring frequent D50/oral glucose. C-peptide levels high, MRI abdomen negative for pancreatic tumor. ESRD: Continue HD on TTS schedule - next tomorrow. Anemia: Hgb down to 6.7 on 10/4 -> given 1U PRBCs and Aranesp. MRI showed iron deposition in liver/spleen - no IV iron. Ordered stool guaiac. Secondary hyperparathyroidism: Phos stable on Renvela 800 mg 4 with meals. CorrCa high - VDRA on hold, continue PO sensipar. HTN/volume: BP better, restarted amlodipine + metoprolol. Nutrition: Alb 2.5, on multiple supplements.  Veneta Penton, PA-C 04/14/2021, 10:12 AM  [employer]

## 2021-04-14 NOTE — TOC Progression Note (Addendum)
Transition of Care [facility]) - Progression Note    Patient Details  Name: Raymond [name] MRN: [id number] Date of Birth: 03-02-1956  Transition of Care [facility]) CM/SW [place], [place] Phone Number: 04/14/2021, 2:16 PM  Clinical Narrative:     CSW received message from [facility] that they had something unexpected come up and are unable to accept patient for dc today if medically ready. CSW informed MD. CSW will send updated clinicals to patients insurance for review. CSW will continue to follow and assist with dc planning needs.  Expected Discharge Plan: [place] Barriers to Discharge: No Barriers Identified  Expected Discharge Plan and Services Expected Discharge Plan: [place] arrangements for the past 2 months: Apartment                                       Social Determinants of Health (SDOH) Interventions    Readmission Risk Interventions Readmission Risk Prevention Plan 06/22/2020  Transportation Screening Complete  Medication Review Press photographer) Complete  PCP or Specialist appointment within 3-5 days of discharge Complete  HRI or [place] Complete  SW Recovery Care/Counseling Consult Complete  Coffee Patient Refused  Some recent data might be hidden

## 2021-04-14 NOTE — Progress Notes (Signed)
Nutrition Follow-up  DOCUMENTATION CODES:   Not applicable  INTERVENTION:   Continue Nepro Shake po BID, each supplement provides 420 kcal and 19 grams protein. Continue Renal MVI daily.  NUTRITION DIAGNOSIS:   Increased nutrient needs related to chronic illness (ESRD on HD) as evidenced by estimated needs.  Ongoing  GOAL:   Patient will meet greater than or equal to 90% of their needs  Progressing  MONITOR:   PO intake, Supplement acceptance, Diet advancement, Labs  REASON FOR ASSESSMENT:   Consult Enteral/tube feeding initiation and management  ASSESSMENT:   This 65 y.o. African American male smoker presented to the [facility] Emergency Department via EMS with complaints of altered mental status.  A well-check request was called in by a family member, and EMS apparently found the patient to be unresponsive and hypoglycemic (22).  He subsequently experienced cardiac arrest in the field.  CPR was initiated with ROSC achieved after 20 min of CPR/ACLS efforts per verbal report (no further details at this time).  The patient was also noted to be hypothermic (rectal probe unable to register temp).  In the ER, the patient had [facility] airway exchanged to endotracheal tube (with RSI medications).  He was started on [facility].  Monitor showed junctional bradycardia.  PCCM asked to admit.  Patient remains on a regular diet.  Recent meal intakes not recorded.  He thinks he is eating well, eating most all of his meals and has been drinking Nepro shakes twice per day.  Last HD 10/4 (2.3 L UF), next HD planned for 10/6.  Labs reviewed.  CBG: [phone]  Medications reviewed and include Sensipar, Aranesp, dextrose gel QID, folic acid, Rena-vit, Protonix, Renvela, thiamine.  Current weight 75.6 kg (10/5) Admit weight 83.8 kg (9/24) Weight decreasing with fluid removal with HD.  Diet Order:   Diet Order             Diet regular Room service appropriate? Yes;  Fluid consistency: Thin; Fluid restriction: 1500 mL Fluid  Diet effective now                   EDUCATION NEEDS:   Not appropriate for education at this time  Skin:  Skin Assessment: Reviewed RN Assessment  Last BM:  unknown  Height:   Ht Readings from Last 1 Encounters:  04/03/21 5\' 7"  (1.702 m)    Weight:   Wt Readings from Last 1 Encounters:  04/14/21 75.6 kg    Ideal Body Weight:  67.3 kg  BMI:  Body mass index is 26.1 kg/m.  Estimated Nutritional Needs:   Kcal:  [id number]  Protein:  110-125 grams  Fluid:  1 L + UOP    Lucas Mallow, RD, LDN, CNSC Please refer to Amion for contact information.

## 2021-04-14 NOTE — Progress Notes (Signed)
Physical Therapy Treatment Patient Details Name: RANI SISNEY MRN: [id number] DOB: [birth year] Today's Date: 04/14/2021   History of Present Illness 65 y.o. male admitted 9/24 with initial EMS call for AMS and hypoglycemia with PEA arrest in field and CPR with ROSC after 20 min. Intubated 9/24-9/25. PMhx; COPD, ESRD on iHD, hep C, PAD, HTN, substance abuse, thyroid disease, DM2, mild CAD    PT Comments    Pt reporting a R-sided headache and neck pain upon arrival. Pain was reproduced with palpation and stretching of R upper trapezius and levator scapula muscles, thus provided gentle stretching, soft tissue mobilization, and education on self-stretching to address his pain. Pt continues to be at high risk for falls, evident by his increased trunk sway when trying to donn his mask in standing without UE support, needing minA to maintain his balance. Pt was able to ambulate up to ~140 ft with a RW and min guard-minA, but displays an antalgic gait pattern and slow gait pace. Will continue to follow acutely. Current recommendations remain appropriate.     Recommendations for follow up therapy are one component of a multi-disciplinary discharge planning process, led by the attending physician.  Recommendations may be updated based on patient status, additional functional criteria and insurance authorization.  Follow Up Recommendations  SNF;Supervision for mobility/OOB     Equipment Recommendations  None recommended by PT    Recommendations for Other Services       Precautions / Restrictions Precautions Precautions: Fall Precaution Comments: left ankle edema and pain Restrictions Weight Bearing Restrictions: No     Mobility  Bed Mobility Overal bed mobility: Modified Independent Bed Mobility: Supine to Sit     Supine to sit: Modified independent (Device/Increase time)     General bed mobility comments: mod I with HOB elevated to transition supine > sit L EOB.     Transfers Overall transfer level: Needs assistance Equipment used: Rolling walker (2 wheeled) Transfers: Sit to/from Stand Sit to Stand: Min guard         General transfer comment: Cues for hand placement to push up to stand rather than pull up on RW, follows cues. Pt sitting a bit posteriorly with transfer to stand causing difficulty powering up as his toes lifted off the floor. Min guard assist for safety.  Ambulation/Gait Ambulation/Gait assistance: Min assist;Min guard Gait Distance (Feet): 140 Feet Assistive device: Rolling walker (2 wheeled) Gait Pattern/deviations: Step-to pattern;Antalgic;Trunk flexed;Decreased stance time - left;Decreased step length - right Gait velocity: reduced Gait velocity interpretation: <1.8 ft/sec, indicate of risk for recurrent falls General Gait Details: Pt with antalgic gait pattern with decreased L stance and thus R step length due to L ankle pain. Cues provided to improve upright posture, momentary success, and to increase R step length, min success. Min guard assist majority of time but minA intermittently, primarily when turning to ensure pt maintained balance.   Stairs             Wheelchair Mobility    Modified Rankin (Stroke Patients Only)       Balance Overall balance assessment: Needs assistance Sitting-balance support: Feet supported;No upper extremity supported Sitting balance-Leahy Scale: Fair Sitting balance - Comments: Static sitting EOB with supervision for safety.   Standing balance support: Bilateral upper extremity supported;No upper extremity supported;During functional activity Standing balance-Leahy Scale: Fair Standing balance comment: Able to stand briefly without UE support to donn mask but needed up to minA to steady due to increased trunk sway without UE support. Bil  UE support for mobility.                            Cognition Arousal/Alertness: Awake/alert Behavior During Therapy: WFL for  tasks assessed/performed Overall Cognitive Status: Within Functional Limits for tasks assessed                                 General Comments: Pt following commands appropriately. Soft spoken.      Exercises Other Exercises Other Exercises: Provided and educated pt on stretching R upper trap and levator scap muscles to relieve pain and headache reproduced with palpation and stretching of these muscles    General Comments General comments (skin integrity, edema, etc.): HR 70s-83 throughout      Pertinent Vitals/Pain Pain Assessment: 0-10 Pain Score: 8  Pain Location: left ankle, R-sided headache and R neck pain Pain Descriptors / Indicators: Aching;Grimacing;Guarding;Headache Pain Intervention(s): Limited activity within patient's tolerance;Monitored during session;Repositioned;Patient requesting pain meds-RN notified;Utilized relaxation techniques (applied soft tissue mobilization to R upper trap and levator scap and educated on stretching)    Home Living                      Prior Function            PT Goals (current goals can now be found in the care plan section) Acute Rehab PT Goals Patient Stated Goal: to not hurt PT Goal Formulation: With patient Time For Goal Achievement: 04/23/21 Potential to Achieve Goals: Good Progress towards PT goals: Progressing toward goals    Frequency    Min 3X/week      PT Plan Current plan remains appropriate    Co-evaluation              AM-PAC PT "6 Clicks" Mobility   Outcome Measure  Help needed turning from your back to your side while in a flat bed without using bedrails?: None Help needed moving from lying on your back to sitting on the side of a flat bed without using bedrails?: None Help needed moving to and from a bed to a chair (including a wheelchair)?: A Little Help needed standing up from a chair using your arms (e.g., wheelchair or bedside chair)?: A Little Help needed to walk in  hospital room?: A Little Help needed climbing 3-5 steps with a railing? : A Lot 6 Click Score: 19    End of Session Equipment Utilized During Treatment: Gait belt Activity Tolerance: Patient tolerated treatment well;Patient limited by pain Patient left: in chair;Other (comment) (with OT) Nurse Communication: Mobility status;Other (comment) (HR) PT Visit Diagnosis: Other abnormalities of gait and mobility (R26.89);Muscle weakness (generalized) (M62.81);Unsteadiness on feet (R26.81);Difficulty in walking, not elsewhere classified (R26.2)     Time: 8832-5498 PT Time Calculation (min) (ACUTE ONLY): 15 min  Charges:  $Gait Training: 8-22 mins                     Moishe Spice, PT, DPT Acute Rehabilitation Services  Pager: [phone] Office: [place] 04/14/2021, 10:56 AM

## 2021-04-14 NOTE — Therapy (Signed)
Occupational Therapy Treatment Patient Details Name: GABRAEL MARES MRN: [id number] DOB: 28-Mar-1956 Today's Date: 04/14/2021   History of present illness 65 y.o. male admitted 9/24 with initial EMS call for AMS and hypoglycemia with PEA arrest in field and CPR with ROSC after 20 min. Intubated 9/24-9/25. PMhx; COPD, ESRD on iHD, hep C, PAD, HTN, substance abuse, thyroid disease, DM2, mild CAD   OT comments  Pt was agreeable to OT session following PT this morning. Pt participated in ADL retraining session to include review of role of OT, LB dressing while seated for don socks, sit to stand from chair with focus on increased activity tolerance/endurance, functional mobility in room to bathroom and on/off toilet using GB and RW. Pt requires vc's for safety and sequencing esp hand placement during transfers. Pt then amb w/ assist in room and transferred into his recliner chair. He is overall Min A for functional mobility/transfers. RN was made aware that pt is up in chair resting, call bell in reach and chair alarm is on. Pt requesting pain medication for LE pain and head ache as well.   Recommendations for follow up therapy are one component of a multi-disciplinary discharge planning process, led by the attending physician.  Recommendations may be updated based on patient status, additional functional criteria and insurance authorization.    Follow Up Recommendations  SNF;Supervision/Assistance - 24 hour    Equipment Recommendations   (Defer to next venue)    Recommendations for Other Services      Precautions / Restrictions Precautions Precautions: Fall Precaution Comments: left ankle edema and pain Restrictions Weight Bearing Restrictions: No       Mobility Bed Mobility Overal bed mobility:  (Pt received at conclusion of PT session while ambulating in room.) Bed Mobility: Supine to Sit     Supine to sit: Modified independent (Device/Increase time)     General bed mobility  comments: Pt received at conclusion of PT session while ambulating in room.    Transfers Overall transfer level: Needs assistance Equipment used: Rolling walker (2 wheeled) Transfers: Sit to/from [employer] Sit to Stand: Min assist Stand pivot transfers: Min assist;Mod assist       General transfer comment: Cues for hand placement to push up to stand rather than pull up on RW, follows cues. Pt sitting a bit posteriorly with transfer to stand causing difficulty powering up as his toes lifted off the floor. Min A for safety secndary to fatigue following therapy sessions.    Balance Overall balance assessment: Needs assistance Sitting-balance support: Feet supported;No upper extremity supported Sitting balance-Leahy Scale: Fair Sitting balance - Comments: Static sitting EOB with supervision for safety.   Standing balance support: Bilateral upper extremity supported;During functional activity Standing balance-Leahy Scale: Fair Standing balance comment: Reliant on Bil UE support for mobility.       ADL either performed or assessed with clinical judgement   ADL Overall ADL's : Needs assistance/impaired     Lower Body Dressing: Moderate assistance;Sit to/from stand   Toilet Transfer: Minimal assistance;Ambulation;RW;Grab bars Toilet Transfer Details (indicate cue type and reason): VC's for sequencing and hand placement with sit to stand from chair in room and stand to sit using GB and not to hold onto RW w/ both ahdns when sitting.. Toileting- Clothing Manipulation and Hygiene: Sit to/from stand;Minimal assistance       Functional mobility during ADLs: Minimal assistance;Cueing for safety;Cueing for sequencing;Rolling walker (Cues for safety/sequencing w/ RW) General ADL Comments: Pt was agreeable to OT session  following PT this morning. Pt participated in ADL retraining session to include LB dressing while seated for don socks, sit to stand from chair, functional  mobility in room to bathroom and on/off toilet using GB. Pt requires vc's for safety and sequencing esp hand placement during transfers. Pt then amb w/ assist in room and transferred into his recliner chair. RN was made aware that pt is up in chair resting and chair alarm is on. Pt requesting pain medication for LE pain and head ache as well.    Cognition Arousal/Alertness: Awake/alert Behavior During Therapy: WFL for tasks assessed/performed Overall Cognitive Status: Within Functional Limits for tasks assessed     General Comments: Pt following commands appropriately. Soft spoken.        Exercises Exercises: Other exercises Other Exercises Other Exercises: Provided and educated pt on stretching R upper trap and levator scap muscles to relieve pain and headache reproduced with palpation and stretching of these muscles   Shoulder Instructions       General Comments HR 70s-85 throughout and back into lower 70's when reclinied in chair following treatment session. RN made aware.    Pertinent Vitals/ Pain       Pain Assessment: 0-10 Pain Score: 8  Pain Location: left ankle, R-sided headache and R neck pain Pain Descriptors / Indicators: Aching;Grimacing;Guarding;Headache Pain Intervention(s): Limited activity within patient's tolerance;Monitored during session;Repositioned;Patient requesting pain meds-RN notified   Frequency  Min 2X/week     Progress Toward Goals  OT Goals(current goals can now be found in the care plan section)  Progress towards OT goals: Progressing toward goals  Acute Rehab OT Goals Patient Stated Goal: Decreased pain in legs Time For Goal Achievement: 04/20/21 Potential to Achieve Goals: Good  Plan Discharge plan remains appropriate       AM-PAC OT "6 Clicks" Daily Activity     Outcome Measure   Help from another person eating meals?: A Little Help from another person taking care of personal grooming?: A Little Help from another person toileting,  which includes using toliet, bedpan, or urinal?: A Little Help from another person bathing (including washing, rinsing, drying)?: A Lot Help from another person to put on and taking off regular upper body clothing?: A Little Help from another person to put on and taking off regular lower body clothing?: A Lot 6 Click Score: 16    End of Session Equipment Utilized During Treatment: Gait belt;Rolling walker  OT Visit Diagnosis: Unsteadiness on feet (R26.81);Muscle weakness (generalized) (M62.81);Other symptoms and signs involving cognitive function;Pain Pain - part of body:  (LE's and headache per pt report)   Activity Tolerance Patient limited by lethargy;No increased pain   Patient Left in chair;with call bell/phone within reach;with chair alarm set   Nurse Communication Mobility status;Patient requests pain meds;Other (comment) (HR during session)        Time: 6962-9528 OT Time Calculation (min): 19 min  Charges: OT General Charges $OT Visit: 1 Visit OT Treatments $Self Care/Home Management : 8-22 mins   Ammon Muscatello Beth Dixon, OTR/L 04/14/2021, 11:21 AM

## 2021-04-14 NOTE — Progress Notes (Signed)
Dr. Rodena Piety updated with pt bradycardic episodes , with possible 3rd  degree AV block, asymptomatic, HR now back in the 80's, Bp = 174/104, will recheck . Cardiology had signed off prior. Dr. Rodena Piety to review and see pt.

## 2021-04-15 LAB — CBC
HCT: 25.3 % — ABNORMAL LOW (ref 39.0–52.0)
Hemoglobin: 8 g/dL — ABNORMAL LOW (ref 13.0–17.0)
MCH: 32.1 pg (ref 26.0–34.0)
MCHC: 31.6 g/dL (ref 30.0–36.0)
MCV: 101.6 fL — ABNORMAL HIGH (ref 80.0–100.0)
Platelets: 173 10*3/uL (ref 150–400)
RBC: 2.49 MIL/uL — ABNORMAL LOW (ref 4.22–5.81)
RDW: 18.7 % — ABNORMAL HIGH (ref 11.5–15.5)
WBC: 3.4 10*3/uL — ABNORMAL LOW (ref 4.0–10.5)
nRBC: 0 % (ref 0.0–0.2)

## 2021-04-15 LAB — RENAL FUNCTION PANEL
Albumin: 2.5 g/dL — ABNORMAL LOW (ref 3.5–5.0)
Anion gap: 10 (ref 5–15)
BUN: 38 mg/dL — ABNORMAL HIGH (ref 8–23)
CO2: 28 mmol/L (ref 22–32)
Calcium: 8.9 mg/dL (ref 8.9–10.3)
Chloride: 98 mmol/L (ref 98–111)
Creatinine, Ser: 7.59 mg/dL — ABNORMAL HIGH (ref 0.61–1.24)
GFR, Estimated: 7 mL/min — ABNORMAL LOW (ref >60)
Glucose, Bld: 82 mg/dL (ref 70–99)
Phosphorus: 2.2 mg/dL — ABNORMAL LOW (ref 2.5–4.6)
Potassium: 4.4 mmol/L (ref 3.5–5.1)
Sodium: 136 mmol/L (ref 135–145)

## 2021-04-15 LAB — GLUCOSE, CAPILLARY
Glucose-Capillary: 100 mg/dL — ABNORMAL HIGH (ref 70–99)
Glucose-Capillary: 109 mg/dL — ABNORMAL HIGH (ref 70–99)
Glucose-Capillary: 110 mg/dL — ABNORMAL HIGH (ref 70–99)
Glucose-Capillary: 58 mg/dL — ABNORMAL LOW (ref 70–99)
Glucose-Capillary: 61 mg/dL — ABNORMAL LOW (ref 70–99)
Glucose-Capillary: 79 mg/dL (ref 70–99)
Glucose-Capillary: 81 mg/dL (ref 70–99)
Glucose-Capillary: 82 mg/dL (ref 70–99)
Glucose-Capillary: 95 mg/dL (ref 70–99)

## 2021-04-15 LAB — RESP PANEL BY RT-PCR (FLU A&B, COVID) ARPGX2
Influenza A by PCR: NEGATIVE
Influenza B by PCR: NEGATIVE
SARS Coronavirus 2 by RT PCR: NEGATIVE

## 2021-04-15 MED ORDER — SEVELAMER CARBONATE 800 MG PO TABS
2400.0000 mg | ORAL_TABLET | Freq: Three times a day (TID) | ORAL | Status: DC
  Filled 2021-04-15: qty 3

## 2021-04-15 MED ORDER — GLUCOSE 40 % PO GEL: 1.0000 | ORAL | Status: DC

## 2021-04-15 NOTE — Progress Notes (Signed)
Progress Note  Patient Name: Raymond [name] Date of Encounter: 04/15/2021  [facility] Cardiologist: Jenkins Rouge, MD   Subjective   Raymond [name] has not noticed any changes in his heart rate. He denies any symptoms  Inpatient Medications    Scheduled Meds:  amLODipine  5 mg Oral QHS   budesonide (PULMICORT) nebulizer solution  0.25 mg Nebulization BID   cinacalcet  180 mg Oral Q T,Th,Sa-HD   darbepoetin (ARANESP) injection - DIALYSIS  150 mcg Intravenous Q Tue-HD   dextrose  1 Tube Oral QID   feeding supplement (NEPRO CARB STEADY)  237 mL Oral BID BM   folic acid  1 mg Oral Daily   metoprolol tartrate  50 mg Oral BID   multivitamin  1 tablet Oral QHS   pantoprazole  40 mg Oral QHS   QUEtiapine  100 mg Oral QHS   sevelamer carbonate  2,400 mg Oral TID WC   thiamine  100 mg Oral Daily   Continuous Infusions:  sodium chloride Stopped (04/05/21 2244)   PRN Meds: sodium chloride, acetaminophen, calcium chloride, docusate sodium, hydrALAZINE, ipratropium-albuterol, melatonin, polyethylene glycol   Vital Signs    Vitals:   04/15/21 1212 04/15/21 1220 04/15/21 1404 04/15/21 1458  BP: (!) 159/91 (!) 155/63 124/71   Pulse: 66 66 73   Resp:  (!) 22 17   Temp:  98 F (36.7 C) 98.4 F (36.9 C)   TempSrc:  Oral Oral   SpO2:  100%  100%  Weight:  71.7 kg    Height:        Intake/Output Summary (Last 24 hours) at 04/15/2021 1705 Last data filed at 04/15/2021 1220 Gross per 24 hour  Intake --  Output 3000 ml  Net -3000 ml   Last 3 Weights 04/15/2021 04/15/2021 04/15/2021  Weight (lbs) 158 lb 1.1 oz 166 lb 10.7 oz 166 lb 6.4 oz  Weight (kg) 71.7 kg 75.6 kg 75.479 kg      Telemetry    Sinus rhythm. No significant AV block identified- Personally Reviewed  ECG    10/5-  NSR with 1st degree AV block- Personally Reviewed  Physical Exam   GEN: No acute distress.  Getting dialysis Neck: No JVD Cardiac: RRR, no murmurs, rubs, or gallops.  Vasc: LUE fistula with good  thrill Respiratory: Clear to auscultation bilaterally. GI: Soft, nontender, non-distended  MS: No edema; No deformity. Skin: warm Neuro:  Nonfocal  Psych: Normal affect   Labs    High Sensitivity Troponin:   Recent Labs  Lab 04/03/21 0025 04/03/21 0530 04/03/21 2152 04/04/21 0113 04/04/21 0907  TROPONINIHS 124* 130* 242* 291* 175*     Chemistry Recent Labs  Lab 04/13/21 0641 04/13/21 0700 04/15/21 0638  NA 133* 134* 136  K 4.4 4.5 4.4  CL 97* 98 98  CO2 _0 GLUCOSE 79 76 82  BUN 48* 47* 38*  CREATININE 9.05* 8.90* 7.59*  CALCIUM 9.4 9.5 8.9  ALBUMIN 2.4* 2.5* 2.5*  GFRNONAA 6* 6* 7*  ANIONGAP _1 Lipids No results for input(s): CHOL, TRIG, HDL, LABVLDL, LDLCALC, CHOLHDL in the last 168 hours.  Hematology Recent Labs  Lab 04/13/21 0700 04/14/21 0953 04/15/21 0638  WBC 3.8* 3.7* 3.4*  RBC 2.09* 2.54* 2.49*  HGB 6.7* 8.3* 8.0*  HCT 22.2* 25.7* 25.3*  MCV 106.2* 101.2* 101.6*  MCH 32.1 32.7 32.1  MCHC 30.2 32.3 31.6  RDW 17.3* 18.8* 18.7*  PLT 139* 156 173  Thyroid No results for input(s): TSH, FREET4 in the last 168 hours.  BNPNo results for input(s): BNP, PROBNP in the last 168 hours.  DDimer No results for input(s): DDIMER in the last 168 hours.   Radiology    No results found.  Cardiac Studies   Echo 04/04/2021 EF 50-55, LVH Calcified aorta Thickened mitral valve/MAC No significant valvular stenosis or insufficiency  Patient Profile     65 y.o. male with ESRD, COPD, HCV, hypertension, substance abuse, thyroid disease, diabetes mellitus type 2, and mild CAD and hypertensive heart dx with elevated troponin and PEA arrest with hypoglycemia and hypothermia cardiology was following earlier during the admission post PEA arrest with noted junctional rhythm. His arrhythmia resolved. Cardiology was re-engaged for c/f CHB  Assessment & Plan     #Rhythm: Raymond [name] has sinus rhythm on telemetry. Do not see evidence of CHB with concern  around 10/5 at 11 am. Would continue to monitor and we will follow along. If bradyarrhythmia suspected please obtain 12 lead EKG at that time.Can continue current metop 50 mg BID.   For questions or updates, please contact [place] Please consult [email or web address] for contact info under        Signed, Janina Mayo, MD  04/15/2021, 5:05 PM

## 2021-04-15 NOTE — Progress Notes (Signed)
  [facility] Progress Note   Subjective:  Seen on HD - 3L UFG and tolerating. BP slightly high, denies CP or dyspnea although using nasal O2.  Objective Vitals:   04/15/21 0500 04/15/21 0758 04/15/21 0832 04/15/21 0837  BP:  (!) 148/86 (!) 163/84 139/71  Pulse:  69 68 71  Resp:  19 (!) 24   Temp:  98.1 F (36.7 C) 98.3 F (36.8 C)   TempSrc:  Oral Oral   SpO2:   100%   Weight: 75.5 kg  75.6 kg   Height:       Physical Exam General: Well appearing man, NAD. Nasal O2. Heart: RRR; no murmur Lungs: CTAB Abdomen: soft Extremities: Trace LLE edema; tiny scab/blister to R shin Dialysis Access: L forearm AVF + bruit  Additional Objective Labs: Basic Metabolic Panel: Recent Labs  Lab 04/13/21 0641 04/13/21 0700 04/15/21 0638  NA 133* 134* 136  K 4.4 4.5 4.4  CL 97* 98 98  CO2 26 26 28   GLUCOSE 79 76 82  BUN 48* 47* 38*  CREATININE 9.05* 8.90* 7.59*  CALCIUM 9.4 9.5 8.9  PHOS 3.5 3.5 2.2*   Liver Function Tests: Recent Labs  Lab 04/13/21 0641 04/13/21 0700 04/15/21 0638  ALBUMIN 2.4* 2.5* 2.5*   CBC: Recent Labs  Lab 04/13/21 0221 04/13/21 0641 04/13/21 0700 04/14/21 0953 04/15/21 0638  WBC 4.3 3.9* 3.8* 3.7* 3.4*  HGB 7.1* 7.1* 6.7* 8.3* 8.0*  HCT 22.5* 22.9* 22.2* 25.7* 25.3*  MCV 104.2* 104.1* 106.2* 101.2* 101.6*  PLT 147* 145* 139* 156 173   Medications:  sodium chloride Stopped (04/05/21 2244)    amLODipine  5 mg Oral QHS   budesonide (PULMICORT) nebulizer solution  0.25 mg Nebulization BID   cinacalcet  180 mg Oral Q T,Th,Sa-HD   darbepoetin (ARANESP) injection - DIALYSIS  150 mcg Intravenous Q Tue-HD   dextrose  1 Tube Oral QID   feeding supplement (NEPRO CARB STEADY)  237 mL Oral BID BM   folic acid  1 mg Oral Daily   metoprolol tartrate  50 mg Oral BID   multivitamin  1 tablet Oral QHS   pantoprazole  40 mg Oral QHS   QUEtiapine  100 mg Oral QHS   sevelamer carbonate  3,200 mg Oral TID WC   thiamine  100 mg Oral Daily     Dialysis Orders: TTS - [place] 4hrs, BFR 450, DFR Auto flow 1.5,  EDW 69kg, 2K/ 2Ca - Sensipar 180mg  with HD - Calcitriol 62mcg (recently raised) with HD-last dose (2.6mcg) on 03/25/21 - Venofer 100mg  IV X 10 doses-hasn't been given yet.   Assessment/Plan: Acute encephalopathy: Improved now, felt d/t hypoglycemia and s/p cardiac arrest with element of hypoxic/anoxic brain injury.  Hypoglycemia: Ongoing issue this admit, requiring frequent D50/oral glucose. C-peptide levels high, MRI abdomen negative for pancreatic tumor. ESRD: Continue HD on TTS schedule - HD today. Anemia: Hgb down to 6.7 on 10/4 -> given 1U PRBCs and Aranesp. MRI showed iron deposition in liver/spleen - no IV iron. Ordered stool guaiac. Hgb 8 today. Secondary hyperparathyroidism: Phos low on Renvela 800 mg 4 with meals -> reduce to 3/meals. CorrCa high - VDRA on hold, continue PO sensipar. HTN/volume: BP better, restarted amlodipine + metoprolol. Some edema on exam - 3L UFG today. Nutrition: Alb 2.5, on multiple supplements.    Veneta Penton, PA-C 04/15/2021, 8:53 AM  [employer]

## 2021-04-15 NOTE — TOC Transition Note (Signed)
Transition of Care [facility]) - CM/SW Discharge Note   Patient Details  Name: Raymond [name] MRN: [id number] Date of Birth: 08/21/1955  [facility] CM/SW Contact:  Milas Gain, [place] Phone Number: 04/15/2021, 2:28 PM   Clinical Narrative:     Patient will DC to: [facility]  Anticipated DC date: 04/15/2021  Family notified: Neoma Laming  Transport by: Corey Harold  ?  Per MD patient ready for DC to [facility]. RN, patient, patient's family, Olivia Mackie Renal navigator, and facility notified of DC. Discharge Summary sent to facility. RN given number for report tele# [phone] RM# 130 A. DC packet on chart. Ambulance transport requested for patient.  CSW signing off.   Final next level of care: Skilled Nursing Facility Barriers to Discharge: No Barriers Identified   Patient Goals and CMS Choice Patient states their goals for this hospitalization and ongoing recovery are:: SNF CMS Medicare.gov Compare Post Acute Care list provided to:: Patient Choice offered to / list presented to : Patient  Discharge Placement              Patient chooses bed at: [facility] Patient to be transferred to facility by: [place] Name of family member notified: Neoma Laming Patient and family notified of of transfer: 04/15/21  Discharge Plan and Services                                     Social Determinants of Health ([place]) Interventions     Readmission Risk Interventions Readmission Risk Prevention Plan 06/22/2020  Transportation Screening Complete  Medication Review Press photographer) Complete  PCP or Specialist appointment within 3-5 days of discharge Complete  HRI or [place] Complete  SW Recovery Care/Counseling Consult Complete  [facility] [place] Patient Refused  Some recent data might be hidden

## 2021-04-15 NOTE — Progress Notes (Signed)
Patient blood sugar 58 upon checking. Patient alert and aware. Primary nurse notified. Patient was given crackers, juice and sandwich bag. Blood sugar 61 upon recheck. Patient remains in dialysis suite. Patient alert and oriented to person and place.

## 2021-04-15 NOTE — Progress Notes (Signed)
Attempted to give report to SNF facility. Will try again.

## 2021-04-15 NOTE — Progress Notes (Signed)
Paged attending and advised that glucose has been running low. Pt has not been symptomatic, but we are continuing to have to have to intervene to increase glucose. Dr. Rodena Piety adding glucose to discharge summary and verbalized to move forward with discharge.

## 2021-04-15 NOTE — TOC Progression Note (Signed)
Transition of Care [facility]) - Progression Note    Patient Details  Name: Raymond [name] MRN: [id number] Date of Birth: 19-Jan-1956  Transition of Care [facility]) CM/SW [place], [place] Phone Number: 04/15/2021, 10:25 AM  Clinical Narrative:     CSW received insurance authorization approval. Plan auth ID# [id number]. [place] ID# [phone]. Approval from 10/6-10/10. Next review date is 10/10. Patient has SNF bed at [facility]. CSW will continue to follow and assist with patient dc planning needs.  Expected Discharge Plan: [place] Barriers to Discharge: No Barriers Identified  Expected Discharge Plan and Services Expected Discharge Plan: [place] arrangements for the past 2 months: Apartment                                       Social Determinants of Health (SDOH) Interventions    Readmission Risk Interventions Readmission Risk Prevention Plan 06/22/2020  Transportation Screening Complete  Medication Review Press photographer) Complete  PCP or Specialist appointment within 3-5 days of discharge Complete  HRI or [place] Complete  SW Recovery Care/Counseling Consult Complete  [place] Patient Refused  Some recent data might be hidden

## 2021-04-15 NOTE — Progress Notes (Signed)
Attempted again to give report, but advised by nurse that she would return my call.

## 2021-04-15 NOTE — Discharge Summary (Signed)
Physician Discharge Summary  Raymond [name] MVH:[id number] DOB: [birth year] DOA: 04/03/2021  PCP: [name], FNP  Admit date: 04/03/2021 Discharge date: 04/15/2021  Admitted From: home  Disposition: Nursing home Recommendations for Outpatient Follow-up:  Follow up with PCP in 1-2 weeks Please obtain BMP/CBC in one week Please make sure he gets scheduled doses of oral dextrose to avoid hypoglycemia  Home Health: None Equipment/Devices: None  Discharge Condition: Stable CODE STATUS: Full code Diet recommendation: Renal cardiac diet Brief/Interim Summary: 65 year old gentleman with history of ESRD on hemodialysis, pancytopenia, chronic systolic heart failure, hypertension, coronary arteries, peripheral arterial disease, COPD, hepatitis C treated presented to emergency room on 9/24 with altered mental status.  A well check was requested by family member.  Patient was found at home unresponsive and hypoglycemic with blood sugars 22.  Subsequently experienced PEA arrest in the field.  CPR and ROSC achieved in 20 minutes. He was hypothermic.  Intubated and admitted to ICU.   This is the third admission for this patient with hypoglycemia in the hospital. Admitted and discharged 6/3 with altered mental status and hypoglycemia Admitted and discharged 7/13 with altered mental status and hypoglycemia. This time with severe hypoglycemia and hypothermia causing cardiac arrest. C-peptide elevated.  Did not respond to octreotide.  Insulin and pro insulin level is normal. MRI abdomen did not show any pancreatic tumor.  Blood sugars stabilized with a scheduled doses of oral dextrose.    Discharge Diagnoses:  Principal Problem:   Cardiac arrest [facility]) Active Problems:   ESRD (end stage renal disease) ([place])   Hyperkalemia   Hypoglycemia   Pancytopenia (HCC)   Anisocoria   HFrEF (heart failure with reduced ejection fraction) (HCC)   Hypothermia not due to cold exposure   Junctional  bradycardia  PEA cardiac arrest: Secondary to hypoglycemia and hypothermia.  Apparently was not eating well. Echocardiogram with ejection fraction 50 to 55%, left ventricular global hypokinesis, severe LVH. Seen by cardiology.  Recommended no further invasive testing. Resumed beta-blockers.  Hypothermia secondary to hypoglycemia: Probably due to poor oral intake and absorption. C-peptide elevated.  Insulin and proinsulin levels were normal.  With normal insulin level and normal MRI of the pancreas, suspicion of insulin secreting tumor is low. Encourage p.o. intake and keep the patient on regular diet.   Acute metabolic encephalopathy, toxic metabolic encephalopathy with myoclonus: Due to hypoglycemia. Nonfocal.  Clinically improved.  Mental status has improved.  Acute on chronic hypercapnic respiratory failure: Status post intubation and extubation.  Improved.  On room air today.  ESRD on hemodialysis: Followed by nephrology.  On dialysis schedule.  He is on TTS schedule.   Paroxysmal A. fib: Rate controlled.  Continue beta-blocker alone.  Amiodarone was stopped during this hospital stay by cardiology.  Not a candidate for any anticoagulation due to frequent blood transfusion and history of GI bleed.  Patient received 1 unit of packed RBC during this hospital stay.     Nutrition Problem: Increased nutrient needs Etiology: chronic illness (ESRD on HD)    Signs/Symptoms: estimated needs     Interventions: Boost Breeze, MVI  Estimated body mass index is 26.1 kg/m as calculated from the following:   Height as of this encounter: 5\' 7"  (1.702 m).   Weight as of this encounter: 75.6 kg.  Discharge Instructions  Discharge Instructions     Diet - low sodium heart healthy   Complete by: As directed    Increase activity slowly   Complete by: As directed  43.8 ml RIGHT VENTRICLE         IVC TAPSE (M-mode): 1.5 cm  IVC diam: 2.40 cm LEFT ATRIUM             Index       RIGHT ATRIUM           Index LA diam:        4.30 cm 2.26 cm/m  RA Area:     23.80 cm LA Vol (A2C):   39.5 ml 20.76 ml/m RA Volume:   72.40 ml  38.05 ml/m LA Vol (A4C):   71.9 ml 37.79 ml/m LA Biplane Vol: 55.8 ml 29.33 ml/m  AORTIC VALVE             PULMONIC VALVE LVOT Vmax:   117.00 cm/s PR End Diast Vel: 2.11 msec LVOT Vmean:  68.400 cm/s LVOT VTI:    0.183 m  AORTA Ao Root diam: 3.90 cm Ao Asc diam:  4.50 cm MITRAL VALVE               TRICUSPID VALVE MV Area (PHT): 3.68 cm    TR Peak grad:   40.7 mmHg MV Decel Time: 206 msec    TR Vmax:        319.00 cm/s MV E velocity: 76.05 cm/s MV A velocity: 86.50 cm/s  SHUNTS MV E/A ratio:  0.88        Systemic VTI:  0.18 m                            Systemic Diam: 2.20 cm Jenkins Rouge MD Electronically signed by Jenkins Rouge MD Signature Date/Time: 04/04/2021/10:49:42 AM    Final    VAS [place] LOWER EXTREMITY VENOUS (DVT)  Result Date: 04/05/2021  Lower Venous DVT Study Patient Name:  Raymond [name]  Date of Exam:   04/05/2021 Medical Rec #: [id number]      Accession #:    [id number] Date of Birth: Sep 02, 1955      Patient Gender: M Patient Age:   65 years Exam Location:  [facility] Procedure:      VAS [place] LOWER EXTREMITY VENOUS (DVT) Referring Phys: STEPHANIE REESE --------------------------------------------------------------------------------  Indications: Edema.  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 Comparison Study: No prior. Performing Technologist: Oda Cogan RDMS, RVT  Examination Guidelines: A complete evaluation includes B-mode imaging, spectral Doppler, color Doppler, and power  Doppler as needed of all accessible portions of each vessel. Bilateral testing is considered an integral part of a complete examination. Limited examinations for reoccurring indications may be performed as noted. The reflux portion of the exam is performed with the patient in reverse Trendelenburg.  +---------+---------------+---------+-----------+----------+--------------+ RIGHT    CompressibilityPhasicitySpontaneityPropertiesThrombus Aging +---------+---------------+---------+-----------+----------+--------------+ CFV      Full           Yes      Yes                                 +---------+---------------+---------+-----------+----------+--------------+ SFJ      Full                                                        +---------+---------------+---------+-----------+----------+--------------+ FV Prox  Full                                                        +---------+---------------+---------+-----------+----------+--------------+  Physician Discharge Summary  Raymond [name] MVH:[id number] DOB: [birth year] DOA: 04/03/2021  PCP: [name], FNP  Admit date: 04/03/2021 Discharge date: 04/15/2021  Admitted From: home  Disposition: Nursing home Recommendations for Outpatient Follow-up:  Follow up with PCP in 1-2 weeks Please obtain BMP/CBC in one week Please make sure he gets scheduled doses of oral dextrose to avoid hypoglycemia  Home Health: None Equipment/Devices: None  Discharge Condition: Stable CODE STATUS: Full code Diet recommendation: Renal cardiac diet Brief/Interim Summary: 65 year old gentleman with history of ESRD on hemodialysis, pancytopenia, chronic systolic heart failure, hypertension, coronary arteries, peripheral arterial disease, COPD, hepatitis C treated presented to emergency room on 9/24 with altered mental status.  A well check was requested by family member.  Patient was found at home unresponsive and hypoglycemic with blood sugars 22.  Subsequently experienced PEA arrest in the field.  CPR and ROSC achieved in 20 minutes. He was hypothermic.  Intubated and admitted to ICU.   This is the third admission for this patient with hypoglycemia in the hospital. Admitted and discharged 6/3 with altered mental status and hypoglycemia Admitted and discharged 7/13 with altered mental status and hypoglycemia. This time with severe hypoglycemia and hypothermia causing cardiac arrest. C-peptide elevated.  Did not respond to octreotide.  Insulin and pro insulin level is normal. MRI abdomen did not show any pancreatic tumor.  Blood sugars stabilized with a scheduled doses of oral dextrose.    Discharge Diagnoses:  Principal Problem:   Cardiac arrest [facility]) Active Problems:   ESRD (end stage renal disease) ([place])   Hyperkalemia   Hypoglycemia   Pancytopenia (HCC)   Anisocoria   HFrEF (heart failure with reduced ejection fraction) (HCC)   Hypothermia not due to cold exposure   Junctional  bradycardia  PEA cardiac arrest: Secondary to hypoglycemia and hypothermia.  Apparently was not eating well. Echocardiogram with ejection fraction 50 to 55%, left ventricular global hypokinesis, severe LVH. Seen by cardiology.  Recommended no further invasive testing. Resumed beta-blockers.  Hypothermia secondary to hypoglycemia: Probably due to poor oral intake and absorption. C-peptide elevated.  Insulin and proinsulin levels were normal.  With normal insulin level and normal MRI of the pancreas, suspicion of insulin secreting tumor is low. Encourage p.o. intake and keep the patient on regular diet.   Acute metabolic encephalopathy, toxic metabolic encephalopathy with myoclonus: Due to hypoglycemia. Nonfocal.  Clinically improved.  Mental status has improved.  Acute on chronic hypercapnic respiratory failure: Status post intubation and extubation.  Improved.  On room air today.  ESRD on hemodialysis: Followed by nephrology.  On dialysis schedule.  He is on TTS schedule.   Paroxysmal A. fib: Rate controlled.  Continue beta-blocker alone.  Amiodarone was stopped during this hospital stay by cardiology.  Not a candidate for any anticoagulation due to frequent blood transfusion and history of GI bleed.  Patient received 1 unit of packed RBC during this hospital stay.     Nutrition Problem: Increased nutrient needs Etiology: chronic illness (ESRD on HD)    Signs/Symptoms: estimated needs     Interventions: Boost Breeze, MVI  Estimated body mass index is 26.1 kg/m as calculated from the following:   Height as of this encounter: 5\' 7"  (1.702 m).   Weight as of this encounter: 75.6 kg.  Discharge Instructions  Discharge Instructions     Diet - low sodium heart healthy   Complete by: As directed    Increase activity slowly   Complete by: As directed  43.8 ml RIGHT VENTRICLE         IVC TAPSE (M-mode): 1.5 cm  IVC diam: 2.40 cm LEFT ATRIUM             Index       RIGHT ATRIUM           Index LA diam:        4.30 cm 2.26 cm/m  RA Area:     23.80 cm LA Vol (A2C):   39.5 ml 20.76 ml/m RA Volume:   72.40 ml  38.05 ml/m LA Vol (A4C):   71.9 ml 37.79 ml/m LA Biplane Vol: 55.8 ml 29.33 ml/m  AORTIC VALVE             PULMONIC VALVE LVOT Vmax:   117.00 cm/s PR End Diast Vel: 2.11 msec LVOT Vmean:  68.400 cm/s LVOT VTI:    0.183 m  AORTA Ao Root diam: 3.90 cm Ao Asc diam:  4.50 cm MITRAL VALVE               TRICUSPID VALVE MV Area (PHT): 3.68 cm    TR Peak grad:   40.7 mmHg MV Decel Time: 206 msec    TR Vmax:        319.00 cm/s MV E velocity: 76.05 cm/s MV A velocity: 86.50 cm/s  SHUNTS MV E/A ratio:  0.88        Systemic VTI:  0.18 m                            Systemic Diam: 2.20 cm Jenkins Rouge MD Electronically signed by Jenkins Rouge MD Signature Date/Time: 04/04/2021/10:49:42 AM    Final    VAS [place] LOWER EXTREMITY VENOUS (DVT)  Result Date: 04/05/2021  Lower Venous DVT Study Patient Name:  Raymond [name]  Date of Exam:   04/05/2021 Medical Rec #: [id number]      Accession #:    [id number] Date of Birth: Sep 02, 1955      Patient Gender: M Patient Age:   65 years Exam Location:  [facility] Procedure:      VAS [place] LOWER EXTREMITY VENOUS (DVT) Referring Phys: STEPHANIE REESE --------------------------------------------------------------------------------  Indications: Edema.  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 Comparison Study: No prior. Performing Technologist: Oda Cogan RDMS, RVT  Examination Guidelines: A complete evaluation includes B-mode imaging, spectral Doppler, color Doppler, and power  Doppler as needed of all accessible portions of each vessel. Bilateral testing is considered an integral part of a complete examination. Limited examinations for reoccurring indications may be performed as noted. The reflux portion of the exam is performed with the patient in reverse Trendelenburg.  +---------+---------------+---------+-----------+----------+--------------+ RIGHT    CompressibilityPhasicitySpontaneityPropertiesThrombus Aging +---------+---------------+---------+-----------+----------+--------------+ CFV      Full           Yes      Yes                                 +---------+---------------+---------+-----------+----------+--------------+ SFJ      Full                                                        +---------+---------------+---------+-----------+----------+--------------+ FV Prox  Full                                                        +---------+---------------+---------+-----------+----------+--------------+  43.8 ml RIGHT VENTRICLE         IVC TAPSE (M-mode): 1.5 cm  IVC diam: 2.40 cm LEFT ATRIUM             Index       RIGHT ATRIUM           Index LA diam:        4.30 cm 2.26 cm/m  RA Area:     23.80 cm LA Vol (A2C):   39.5 ml 20.76 ml/m RA Volume:   72.40 ml  38.05 ml/m LA Vol (A4C):   71.9 ml 37.79 ml/m LA Biplane Vol: 55.8 ml 29.33 ml/m  AORTIC VALVE             PULMONIC VALVE LVOT Vmax:   117.00 cm/s PR End Diast Vel: 2.11 msec LVOT Vmean:  68.400 cm/s LVOT VTI:    0.183 m  AORTA Ao Root diam: 3.90 cm Ao Asc diam:  4.50 cm MITRAL VALVE               TRICUSPID VALVE MV Area (PHT): 3.68 cm    TR Peak grad:   40.7 mmHg MV Decel Time: 206 msec    TR Vmax:        319.00 cm/s MV E velocity: 76.05 cm/s MV A velocity: 86.50 cm/s  SHUNTS MV E/A ratio:  0.88        Systemic VTI:  0.18 m                            Systemic Diam: 2.20 cm Jenkins Rouge MD Electronically signed by Jenkins Rouge MD Signature Date/Time: 04/04/2021/10:49:42 AM    Final    VAS [place] LOWER EXTREMITY VENOUS (DVT)  Result Date: 04/05/2021  Lower Venous DVT Study Patient Name:  Raymond [name]  Date of Exam:   04/05/2021 Medical Rec #: [id number]      Accession #:    [id number] Date of Birth: Sep 02, 1955      Patient Gender: M Patient Age:   65 years Exam Location:  [facility] Procedure:      VAS [place] LOWER EXTREMITY VENOUS (DVT) Referring Phys: STEPHANIE REESE --------------------------------------------------------------------------------  Indications: Edema.  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 Comparison Study: No prior. Performing Technologist: Oda Cogan RDMS, RVT  Examination Guidelines: A complete evaluation includes B-mode imaging, spectral Doppler, color Doppler, and power  Doppler as needed of all accessible portions of each vessel. Bilateral testing is considered an integral part of a complete examination. Limited examinations for reoccurring indications may be performed as noted. The reflux portion of the exam is performed with the patient in reverse Trendelenburg.  +---------+---------------+---------+-----------+----------+--------------+ RIGHT    CompressibilityPhasicitySpontaneityPropertiesThrombus Aging +---------+---------------+---------+-----------+----------+--------------+ CFV      Full           Yes      Yes                                 +---------+---------------+---------+-----------+----------+--------------+ SFJ      Full                                                        +---------+---------------+---------+-----------+----------+--------------+ FV Prox  Full                                                        +---------+---------------+---------+-----------+----------+--------------+  Physician Discharge Summary  Raymond [name] MVH:[id number] DOB: [birth year] DOA: 04/03/2021  PCP: [name], FNP  Admit date: 04/03/2021 Discharge date: 04/15/2021  Admitted From: home  Disposition: Nursing home Recommendations for Outpatient Follow-up:  Follow up with PCP in 1-2 weeks Please obtain BMP/CBC in one week Please make sure he gets scheduled doses of oral dextrose to avoid hypoglycemia  Home Health: None Equipment/Devices: None  Discharge Condition: Stable CODE STATUS: Full code Diet recommendation: Renal cardiac diet Brief/Interim Summary: 65 year old gentleman with history of ESRD on hemodialysis, pancytopenia, chronic systolic heart failure, hypertension, coronary arteries, peripheral arterial disease, COPD, hepatitis C treated presented to emergency room on 9/24 with altered mental status.  A well check was requested by family member.  Patient was found at home unresponsive and hypoglycemic with blood sugars 22.  Subsequently experienced PEA arrest in the field.  CPR and ROSC achieved in 20 minutes. He was hypothermic.  Intubated and admitted to ICU.   This is the third admission for this patient with hypoglycemia in the hospital. Admitted and discharged 6/3 with altered mental status and hypoglycemia Admitted and discharged 7/13 with altered mental status and hypoglycemia. This time with severe hypoglycemia and hypothermia causing cardiac arrest. C-peptide elevated.  Did not respond to octreotide.  Insulin and pro insulin level is normal. MRI abdomen did not show any pancreatic tumor.  Blood sugars stabilized with a scheduled doses of oral dextrose.    Discharge Diagnoses:  Principal Problem:   Cardiac arrest [facility]) Active Problems:   ESRD (end stage renal disease) ([place])   Hyperkalemia   Hypoglycemia   Pancytopenia (HCC)   Anisocoria   HFrEF (heart failure with reduced ejection fraction) (HCC)   Hypothermia not due to cold exposure   Junctional  bradycardia  PEA cardiac arrest: Secondary to hypoglycemia and hypothermia.  Apparently was not eating well. Echocardiogram with ejection fraction 50 to 55%, left ventricular global hypokinesis, severe LVH. Seen by cardiology.  Recommended no further invasive testing. Resumed beta-blockers.  Hypothermia secondary to hypoglycemia: Probably due to poor oral intake and absorption. C-peptide elevated.  Insulin and proinsulin levels were normal.  With normal insulin level and normal MRI of the pancreas, suspicion of insulin secreting tumor is low. Encourage p.o. intake and keep the patient on regular diet.   Acute metabolic encephalopathy, toxic metabolic encephalopathy with myoclonus: Due to hypoglycemia. Nonfocal.  Clinically improved.  Mental status has improved.  Acute on chronic hypercapnic respiratory failure: Status post intubation and extubation.  Improved.  On room air today.  ESRD on hemodialysis: Followed by nephrology.  On dialysis schedule.  He is on TTS schedule.   Paroxysmal A. fib: Rate controlled.  Continue beta-blocker alone.  Amiodarone was stopped during this hospital stay by cardiology.  Not a candidate for any anticoagulation due to frequent blood transfusion and history of GI bleed.  Patient received 1 unit of packed RBC during this hospital stay.     Nutrition Problem: Increased nutrient needs Etiology: chronic illness (ESRD on HD)    Signs/Symptoms: estimated needs     Interventions: Boost Breeze, MVI  Estimated body mass index is 26.1 kg/m as calculated from the following:   Height as of this encounter: 5\' 7"  (1.702 m).   Weight as of this encounter: 75.6 kg.  Discharge Instructions  Discharge Instructions     Diet - low sodium heart healthy   Complete by: As directed    Increase activity slowly   Complete by: As directed  Physician Discharge Summary  Raymond [name] MVH:[id number] DOB: [birth year] DOA: 04/03/2021  PCP: [name], FNP  Admit date: 04/03/2021 Discharge date: 04/15/2021  Admitted From: home  Disposition: Nursing home Recommendations for Outpatient Follow-up:  Follow up with PCP in 1-2 weeks Please obtain BMP/CBC in one week Please make sure he gets scheduled doses of oral dextrose to avoid hypoglycemia  Home Health: None Equipment/Devices: None  Discharge Condition: Stable CODE STATUS: Full code Diet recommendation: Renal cardiac diet Brief/Interim Summary: 65 year old gentleman with history of ESRD on hemodialysis, pancytopenia, chronic systolic heart failure, hypertension, coronary arteries, peripheral arterial disease, COPD, hepatitis C treated presented to emergency room on 9/24 with altered mental status.  A well check was requested by family member.  Patient was found at home unresponsive and hypoglycemic with blood sugars 22.  Subsequently experienced PEA arrest in the field.  CPR and ROSC achieved in 20 minutes. He was hypothermic.  Intubated and admitted to ICU.   This is the third admission for this patient with hypoglycemia in the hospital. Admitted and discharged 6/3 with altered mental status and hypoglycemia Admitted and discharged 7/13 with altered mental status and hypoglycemia. This time with severe hypoglycemia and hypothermia causing cardiac arrest. C-peptide elevated.  Did not respond to octreotide.  Insulin and pro insulin level is normal. MRI abdomen did not show any pancreatic tumor.  Blood sugars stabilized with a scheduled doses of oral dextrose.    Discharge Diagnoses:  Principal Problem:   Cardiac arrest [facility]) Active Problems:   ESRD (end stage renal disease) ([place])   Hyperkalemia   Hypoglycemia   Pancytopenia (HCC)   Anisocoria   HFrEF (heart failure with reduced ejection fraction) (HCC)   Hypothermia not due to cold exposure   Junctional  bradycardia  PEA cardiac arrest: Secondary to hypoglycemia and hypothermia.  Apparently was not eating well. Echocardiogram with ejection fraction 50 to 55%, left ventricular global hypokinesis, severe LVH. Seen by cardiology.  Recommended no further invasive testing. Resumed beta-blockers.  Hypothermia secondary to hypoglycemia: Probably due to poor oral intake and absorption. C-peptide elevated.  Insulin and proinsulin levels were normal.  With normal insulin level and normal MRI of the pancreas, suspicion of insulin secreting tumor is low. Encourage p.o. intake and keep the patient on regular diet.   Acute metabolic encephalopathy, toxic metabolic encephalopathy with myoclonus: Due to hypoglycemia. Nonfocal.  Clinically improved.  Mental status has improved.  Acute on chronic hypercapnic respiratory failure: Status post intubation and extubation.  Improved.  On room air today.  ESRD on hemodialysis: Followed by nephrology.  On dialysis schedule.  He is on TTS schedule.   Paroxysmal A. fib: Rate controlled.  Continue beta-blocker alone.  Amiodarone was stopped during this hospital stay by cardiology.  Not a candidate for any anticoagulation due to frequent blood transfusion and history of GI bleed.  Patient received 1 unit of packed RBC during this hospital stay.     Nutrition Problem: Increased nutrient needs Etiology: chronic illness (ESRD on HD)    Signs/Symptoms: estimated needs     Interventions: Boost Breeze, MVI  Estimated body mass index is 26.1 kg/m as calculated from the following:   Height as of this encounter: 5\' 7"  (1.702 m).   Weight as of this encounter: 75.6 kg.  Discharge Instructions  Discharge Instructions     Diet - low sodium heart healthy   Complete by: As directed    Increase activity slowly   Complete by: As directed  Physician Discharge Summary  Raymond [name] MVH:[id number] DOB: [birth year] DOA: 04/03/2021  PCP: [name], FNP  Admit date: 04/03/2021 Discharge date: 04/15/2021  Admitted From: home  Disposition: Nursing home Recommendations for Outpatient Follow-up:  Follow up with PCP in 1-2 weeks Please obtain BMP/CBC in one week Please make sure he gets scheduled doses of oral dextrose to avoid hypoglycemia  Home Health: None Equipment/Devices: None  Discharge Condition: Stable CODE STATUS: Full code Diet recommendation: Renal cardiac diet Brief/Interim Summary: 65 year old gentleman with history of ESRD on hemodialysis, pancytopenia, chronic systolic heart failure, hypertension, coronary arteries, peripheral arterial disease, COPD, hepatitis C treated presented to emergency room on 9/24 with altered mental status.  A well check was requested by family member.  Patient was found at home unresponsive and hypoglycemic with blood sugars 22.  Subsequently experienced PEA arrest in the field.  CPR and ROSC achieved in 20 minutes. He was hypothermic.  Intubated and admitted to ICU.   This is the third admission for this patient with hypoglycemia in the hospital. Admitted and discharged 6/3 with altered mental status and hypoglycemia Admitted and discharged 7/13 with altered mental status and hypoglycemia. This time with severe hypoglycemia and hypothermia causing cardiac arrest. C-peptide elevated.  Did not respond to octreotide.  Insulin and pro insulin level is normal. MRI abdomen did not show any pancreatic tumor.  Blood sugars stabilized with a scheduled doses of oral dextrose.    Discharge Diagnoses:  Principal Problem:   Cardiac arrest [facility]) Active Problems:   ESRD (end stage renal disease) ([place])   Hyperkalemia   Hypoglycemia   Pancytopenia (HCC)   Anisocoria   HFrEF (heart failure with reduced ejection fraction) (HCC)   Hypothermia not due to cold exposure   Junctional  bradycardia  PEA cardiac arrest: Secondary to hypoglycemia and hypothermia.  Apparently was not eating well. Echocardiogram with ejection fraction 50 to 55%, left ventricular global hypokinesis, severe LVH. Seen by cardiology.  Recommended no further invasive testing. Resumed beta-blockers.  Hypothermia secondary to hypoglycemia: Probably due to poor oral intake and absorption. C-peptide elevated.  Insulin and proinsulin levels were normal.  With normal insulin level and normal MRI of the pancreas, suspicion of insulin secreting tumor is low. Encourage p.o. intake and keep the patient on regular diet.   Acute metabolic encephalopathy, toxic metabolic encephalopathy with myoclonus: Due to hypoglycemia. Nonfocal.  Clinically improved.  Mental status has improved.  Acute on chronic hypercapnic respiratory failure: Status post intubation and extubation.  Improved.  On room air today.  ESRD on hemodialysis: Followed by nephrology.  On dialysis schedule.  He is on TTS schedule.   Paroxysmal A. fib: Rate controlled.  Continue beta-blocker alone.  Amiodarone was stopped during this hospital stay by cardiology.  Not a candidate for any anticoagulation due to frequent blood transfusion and history of GI bleed.  Patient received 1 unit of packed RBC during this hospital stay.     Nutrition Problem: Increased nutrient needs Etiology: chronic illness (ESRD on HD)    Signs/Symptoms: estimated needs     Interventions: Boost Breeze, MVI  Estimated body mass index is 26.1 kg/m as calculated from the following:   Height as of this encounter: 5\' 7"  (1.702 m).   Weight as of this encounter: 75.6 kg.  Discharge Instructions  Discharge Instructions     Diet - low sodium heart healthy   Complete by: As directed    Increase activity slowly   Complete by: As directed  Physician Discharge Summary  Raymond [name] MVH:[id number] DOB: [birth year] DOA: 04/03/2021  PCP: [name], FNP  Admit date: 04/03/2021 Discharge date: 04/15/2021  Admitted From: home  Disposition: Nursing home Recommendations for Outpatient Follow-up:  Follow up with PCP in 1-2 weeks Please obtain BMP/CBC in one week Please make sure he gets scheduled doses of oral dextrose to avoid hypoglycemia  Home Health: None Equipment/Devices: None  Discharge Condition: Stable CODE STATUS: Full code Diet recommendation: Renal cardiac diet Brief/Interim Summary: 65 year old gentleman with history of ESRD on hemodialysis, pancytopenia, chronic systolic heart failure, hypertension, coronary arteries, peripheral arterial disease, COPD, hepatitis C treated presented to emergency room on 9/24 with altered mental status.  A well check was requested by family member.  Patient was found at home unresponsive and hypoglycemic with blood sugars 22.  Subsequently experienced PEA arrest in the field.  CPR and ROSC achieved in 20 minutes. He was hypothermic.  Intubated and admitted to ICU.   This is the third admission for this patient with hypoglycemia in the hospital. Admitted and discharged 6/3 with altered mental status and hypoglycemia Admitted and discharged 7/13 with altered mental status and hypoglycemia. This time with severe hypoglycemia and hypothermia causing cardiac arrest. C-peptide elevated.  Did not respond to octreotide.  Insulin and pro insulin level is normal. MRI abdomen did not show any pancreatic tumor.  Blood sugars stabilized with a scheduled doses of oral dextrose.    Discharge Diagnoses:  Principal Problem:   Cardiac arrest [facility]) Active Problems:   ESRD (end stage renal disease) ([place])   Hyperkalemia   Hypoglycemia   Pancytopenia (HCC)   Anisocoria   HFrEF (heart failure with reduced ejection fraction) (HCC)   Hypothermia not due to cold exposure   Junctional  bradycardia  PEA cardiac arrest: Secondary to hypoglycemia and hypothermia.  Apparently was not eating well. Echocardiogram with ejection fraction 50 to 55%, left ventricular global hypokinesis, severe LVH. Seen by cardiology.  Recommended no further invasive testing. Resumed beta-blockers.  Hypothermia secondary to hypoglycemia: Probably due to poor oral intake and absorption. C-peptide elevated.  Insulin and proinsulin levels were normal.  With normal insulin level and normal MRI of the pancreas, suspicion of insulin secreting tumor is low. Encourage p.o. intake and keep the patient on regular diet.   Acute metabolic encephalopathy, toxic metabolic encephalopathy with myoclonus: Due to hypoglycemia. Nonfocal.  Clinically improved.  Mental status has improved.  Acute on chronic hypercapnic respiratory failure: Status post intubation and extubation.  Improved.  On room air today.  ESRD on hemodialysis: Followed by nephrology.  On dialysis schedule.  He is on TTS schedule.   Paroxysmal A. fib: Rate controlled.  Continue beta-blocker alone.  Amiodarone was stopped during this hospital stay by cardiology.  Not a candidate for any anticoagulation due to frequent blood transfusion and history of GI bleed.  Patient received 1 unit of packed RBC during this hospital stay.     Nutrition Problem: Increased nutrient needs Etiology: chronic illness (ESRD on HD)    Signs/Symptoms: estimated needs     Interventions: Boost Breeze, MVI  Estimated body mass index is 26.1 kg/m as calculated from the following:   Height as of this encounter: 5\' 7"  (1.702 m).   Weight as of this encounter: 75.6 kg.  Discharge Instructions  Discharge Instructions     Diet - low sodium heart healthy   Complete by: As directed    Increase activity slowly   Complete by: As directed  Physician Discharge Summary  Raymond [name] MVH:[id number] DOB: [birth year] DOA: 04/03/2021  PCP: [name], FNP  Admit date: 04/03/2021 Discharge date: 04/15/2021  Admitted From: home  Disposition: Nursing home Recommendations for Outpatient Follow-up:  Follow up with PCP in 1-2 weeks Please obtain BMP/CBC in one week Please make sure he gets scheduled doses of oral dextrose to avoid hypoglycemia  Home Health: None Equipment/Devices: None  Discharge Condition: Stable CODE STATUS: Full code Diet recommendation: Renal cardiac diet Brief/Interim Summary: 65 year old gentleman with history of ESRD on hemodialysis, pancytopenia, chronic systolic heart failure, hypertension, coronary arteries, peripheral arterial disease, COPD, hepatitis C treated presented to emergency room on 9/24 with altered mental status.  A well check was requested by family member.  Patient was found at home unresponsive and hypoglycemic with blood sugars 22.  Subsequently experienced PEA arrest in the field.  CPR and ROSC achieved in 20 minutes. He was hypothermic.  Intubated and admitted to ICU.   This is the third admission for this patient with hypoglycemia in the hospital. Admitted and discharged 6/3 with altered mental status and hypoglycemia Admitted and discharged 7/13 with altered mental status and hypoglycemia. This time with severe hypoglycemia and hypothermia causing cardiac arrest. C-peptide elevated.  Did not respond to octreotide.  Insulin and pro insulin level is normal. MRI abdomen did not show any pancreatic tumor.  Blood sugars stabilized with a scheduled doses of oral dextrose.    Discharge Diagnoses:  Principal Problem:   Cardiac arrest [facility]) Active Problems:   ESRD (end stage renal disease) ([place])   Hyperkalemia   Hypoglycemia   Pancytopenia (HCC)   Anisocoria   HFrEF (heart failure with reduced ejection fraction) (HCC)   Hypothermia not due to cold exposure   Junctional  bradycardia  PEA cardiac arrest: Secondary to hypoglycemia and hypothermia.  Apparently was not eating well. Echocardiogram with ejection fraction 50 to 55%, left ventricular global hypokinesis, severe LVH. Seen by cardiology.  Recommended no further invasive testing. Resumed beta-blockers.  Hypothermia secondary to hypoglycemia: Probably due to poor oral intake and absorption. C-peptide elevated.  Insulin and proinsulin levels were normal.  With normal insulin level and normal MRI of the pancreas, suspicion of insulin secreting tumor is low. Encourage p.o. intake and keep the patient on regular diet.   Acute metabolic encephalopathy, toxic metabolic encephalopathy with myoclonus: Due to hypoglycemia. Nonfocal.  Clinically improved.  Mental status has improved.  Acute on chronic hypercapnic respiratory failure: Status post intubation and extubation.  Improved.  On room air today.  ESRD on hemodialysis: Followed by nephrology.  On dialysis schedule.  He is on TTS schedule.   Paroxysmal A. fib: Rate controlled.  Continue beta-blocker alone.  Amiodarone was stopped during this hospital stay by cardiology.  Not a candidate for any anticoagulation due to frequent blood transfusion and history of GI bleed.  Patient received 1 unit of packed RBC during this hospital stay.     Nutrition Problem: Increased nutrient needs Etiology: chronic illness (ESRD on HD)    Signs/Symptoms: estimated needs     Interventions: Boost Breeze, MVI  Estimated body mass index is 26.1 kg/m as calculated from the following:   Height as of this encounter: 5\' 7"  (1.702 m).   Weight as of this encounter: 75.6 kg.  Discharge Instructions  Discharge Instructions     Diet - low sodium heart healthy   Complete by: As directed    Increase activity slowly   Complete by: As directed  43.8 ml RIGHT VENTRICLE         IVC TAPSE (M-mode): 1.5 cm  IVC diam: 2.40 cm LEFT ATRIUM             Index       RIGHT ATRIUM           Index LA diam:        4.30 cm 2.26 cm/m  RA Area:     23.80 cm LA Vol (A2C):   39.5 ml 20.76 ml/m RA Volume:   72.40 ml  38.05 ml/m LA Vol (A4C):   71.9 ml 37.79 ml/m LA Biplane Vol: 55.8 ml 29.33 ml/m  AORTIC VALVE             PULMONIC VALVE LVOT Vmax:   117.00 cm/s PR End Diast Vel: 2.11 msec LVOT Vmean:  68.400 cm/s LVOT VTI:    0.183 m  AORTA Ao Root diam: 3.90 cm Ao Asc diam:  4.50 cm MITRAL VALVE               TRICUSPID VALVE MV Area (PHT): 3.68 cm    TR Peak grad:   40.7 mmHg MV Decel Time: 206 msec    TR Vmax:        319.00 cm/s MV E velocity: 76.05 cm/s MV A velocity: 86.50 cm/s  SHUNTS MV E/A ratio:  0.88        Systemic VTI:  0.18 m                            Systemic Diam: 2.20 cm Jenkins Rouge MD Electronically signed by Jenkins Rouge MD Signature Date/Time: 04/04/2021/10:49:42 AM    Final    VAS [place] LOWER EXTREMITY VENOUS (DVT)  Result Date: 04/05/2021  Lower Venous DVT Study Patient Name:  Raymond [name]  Date of Exam:   04/05/2021 Medical Rec #: [id number]      Accession #:    [id number] Date of Birth: Sep 02, 1955      Patient Gender: M Patient Age:   65 years Exam Location:  [facility] Procedure:      VAS [place] LOWER EXTREMITY VENOUS (DVT) Referring Phys: STEPHANIE REESE --------------------------------------------------------------------------------  Indications: Edema.  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 Comparison Study: No prior. Performing Technologist: Oda Cogan RDMS, RVT  Examination Guidelines: A complete evaluation includes B-mode imaging, spectral Doppler, color Doppler, and power  Doppler as needed of all accessible portions of each vessel. Bilateral testing is considered an integral part of a complete examination. Limited examinations for reoccurring indications may be performed as noted. The reflux portion of the exam is performed with the patient in reverse Trendelenburg.  +---------+---------------+---------+-----------+----------+--------------+ RIGHT    CompressibilityPhasicitySpontaneityPropertiesThrombus Aging +---------+---------------+---------+-----------+----------+--------------+ CFV      Full           Yes      Yes                                 +---------+---------------+---------+-----------+----------+--------------+ SFJ      Full                                                        +---------+---------------+---------+-----------+----------+--------------+ FV Prox  Full                                                        +---------+---------------+---------+-----------+----------+--------------+  Physician Discharge Summary  Raymond [name] MVH:[id number] DOB: [birth year] DOA: 04/03/2021  PCP: [name], FNP  Admit date: 04/03/2021 Discharge date: 04/15/2021  Admitted From: home  Disposition: Nursing home Recommendations for Outpatient Follow-up:  Follow up with PCP in 1-2 weeks Please obtain BMP/CBC in one week Please make sure he gets scheduled doses of oral dextrose to avoid hypoglycemia  Home Health: None Equipment/Devices: None  Discharge Condition: Stable CODE STATUS: Full code Diet recommendation: Renal cardiac diet Brief/Interim Summary: 65 year old gentleman with history of ESRD on hemodialysis, pancytopenia, chronic systolic heart failure, hypertension, coronary arteries, peripheral arterial disease, COPD, hepatitis C treated presented to emergency room on 9/24 with altered mental status.  A well check was requested by family member.  Patient was found at home unresponsive and hypoglycemic with blood sugars 22.  Subsequently experienced PEA arrest in the field.  CPR and ROSC achieved in 20 minutes. He was hypothermic.  Intubated and admitted to ICU.   This is the third admission for this patient with hypoglycemia in the hospital. Admitted and discharged 6/3 with altered mental status and hypoglycemia Admitted and discharged 7/13 with altered mental status and hypoglycemia. This time with severe hypoglycemia and hypothermia causing cardiac arrest. C-peptide elevated.  Did not respond to octreotide.  Insulin and pro insulin level is normal. MRI abdomen did not show any pancreatic tumor.  Blood sugars stabilized with a scheduled doses of oral dextrose.    Discharge Diagnoses:  Principal Problem:   Cardiac arrest [facility]) Active Problems:   ESRD (end stage renal disease) ([place])   Hyperkalemia   Hypoglycemia   Pancytopenia (HCC)   Anisocoria   HFrEF (heart failure with reduced ejection fraction) (HCC)   Hypothermia not due to cold exposure   Junctional  bradycardia  PEA cardiac arrest: Secondary to hypoglycemia and hypothermia.  Apparently was not eating well. Echocardiogram with ejection fraction 50 to 55%, left ventricular global hypokinesis, severe LVH. Seen by cardiology.  Recommended no further invasive testing. Resumed beta-blockers.  Hypothermia secondary to hypoglycemia: Probably due to poor oral intake and absorption. C-peptide elevated.  Insulin and proinsulin levels were normal.  With normal insulin level and normal MRI of the pancreas, suspicion of insulin secreting tumor is low. Encourage p.o. intake and keep the patient on regular diet.   Acute metabolic encephalopathy, toxic metabolic encephalopathy with myoclonus: Due to hypoglycemia. Nonfocal.  Clinically improved.  Mental status has improved.  Acute on chronic hypercapnic respiratory failure: Status post intubation and extubation.  Improved.  On room air today.  ESRD on hemodialysis: Followed by nephrology.  On dialysis schedule.  He is on TTS schedule.   Paroxysmal A. fib: Rate controlled.  Continue beta-blocker alone.  Amiodarone was stopped during this hospital stay by cardiology.  Not a candidate for any anticoagulation due to frequent blood transfusion and history of GI bleed.  Patient received 1 unit of packed RBC during this hospital stay.     Nutrition Problem: Increased nutrient needs Etiology: chronic illness (ESRD on HD)    Signs/Symptoms: estimated needs     Interventions: Boost Breeze, MVI  Estimated body mass index is 26.1 kg/m as calculated from the following:   Height as of this encounter: 5\' 7"  (1.702 m).   Weight as of this encounter: 75.6 kg.  Discharge Instructions  Discharge Instructions     Diet - low sodium heart healthy   Complete by: As directed    Increase activity slowly   Complete by: As directed

## 2021-04-15 NOTE — Progress Notes (Signed)
Patient glucose 82 prior to leaving HD room.

## 2021-04-15 NOTE — Progress Notes (Signed)
PT Cancellation Note  Patient Details Name: Raymond [name] MRN: [id number] DOB: 1956-01-13   Cancelled Treatment:    Reason Eval/Treat Not Completed: (P) Patient at procedure or test/unavailable Pt is off floor for HD. PT will follow back for treatment this afternoon as able.  Azelyn Batie B. Migdalia Dk PT, DPT Acute Rehabilitation Services Pager [phone] Office [phone]    [place] 04/15/2021, 8:59 AM

## 2021-04-15 NOTE — Progress Notes (Signed)
Contacted [facility] to make them aware that pt to d/c to [facility] snf today and will resume care on Saturday.  Melven Sartorius Renal Navigator [phone]

## 2021-04-16 LAB — DRUG SCREEN 10 W/CONF, SERUM
Amphetamines, IA: NEGATIVE ng/mL
Barbiturates, IA: NEGATIVE ug/mL
Benzodiazepines, IA: POSITIVE ng/mL — AB
Cocaine & Metabolite, IA: POSITIVE ng/mL — AB
Methadone, IA: NEGATIVE ng/mL
Opiates, IA: NEGATIVE ng/mL
Oxycodones, IA: NEGATIVE ng/mL
Phencyclidine, IA: NEGATIVE ng/mL
Propoxyphene, IA: NEGATIVE ng/mL
THC(Marijuana) Metabolite, IA: NEGATIVE ng/mL

## 2021-04-16 LAB — BENZODIAZEPINES,MS,WB/SP RFX
7-Aminoclonazepam: NEGATIVE ng/mL
Alprazolam: NEGATIVE ng/mL
Benzodiazepines Confirm: POSITIVE
Chlordiazepoxide: NEGATIVE
Clonazepam: NEGATIVE ng/mL
Desalkylflurazepam: NEGATIVE ng/mL
Desmethylchlordiazepoxide: NEGATIVE
Desmethyldiazepam: NEGATIVE ng/mL
Diazepam: NEGATIVE ng/mL
Flurazepam: NEGATIVE ng/mL
Lorazepam: NEGATIVE ng/mL
Midazolam: 20.4 ng/mL
Oxazepam: NEGATIVE ng/mL
Temazepam: NEGATIVE ng/mL
Triazolam: NEGATIVE ng/mL

## 2021-04-18 LAB — CULTURE, BLOOD (ROUTINE X 2)
Culture: NO GROWTH
Culture: NO GROWTH
Special Requests: ADEQUATE
Special Requests: ADEQUATE

## 2021-05-11 ENCOUNTER — Ambulatory Visit: Payer: 59 | Admitting: Physician Assistant

## 2021-05-13 ENCOUNTER — Encounter (HOSPITAL_COMMUNITY): Payer: Self-pay

## 2021-05-13 ENCOUNTER — Ambulatory Visit (HOSPITAL_COMMUNITY): Admit: 2021-05-13 | Payer: 59 | Admitting: Gastroenterology

## 2021-05-13 SURGERY — COLONOSCOPY WITH PROPOFOL
Anesthesia: Monitor Anesthesia Care | Laterality: Left

## 2021-05-18 ENCOUNTER — Inpatient Hospital Stay (HOSPITAL_COMMUNITY)
Admission: EM | Admit: 2021-05-18 | Discharge: 2021-05-25 | DRG: 682 | Disposition: A | Discharge status: Skilled Nursing Facility | Payer: 59 | Attending: Internal Medicine | Admitting: Internal Medicine

## 2021-05-18 ENCOUNTER — Emergency Department (HOSPITAL_COMMUNITY): Payer: 59

## 2021-05-18 DIAGNOSIS — N2581 Secondary hyperparathyroidism of renal origin: Secondary | ICD-10-CM | POA: Diagnosis present

## 2021-05-18 DIAGNOSIS — D539 Nutritional anemia, unspecified: Secondary | ICD-10-CM | POA: Diagnosis present

## 2021-05-18 DIAGNOSIS — N179 Acute kidney failure, unspecified: Secondary | ICD-10-CM | POA: Diagnosis not present

## 2021-05-18 DIAGNOSIS — I120 Hypertensive chronic kidney disease with stage 5 chronic kidney disease or end stage renal disease: Secondary | ICD-10-CM | POA: Diagnosis present

## 2021-05-18 DIAGNOSIS — D631 Anemia in chronic kidney disease: Secondary | ICD-10-CM | POA: Diagnosis present

## 2021-05-18 DIAGNOSIS — Z9115 Patient's noncompliance with renal dialysis: Secondary | ICD-10-CM

## 2021-05-18 DIAGNOSIS — J449 Chronic obstructive pulmonary disease, unspecified: Secondary | ICD-10-CM | POA: Diagnosis present

## 2021-05-18 DIAGNOSIS — I2510 Atherosclerotic heart disease of native coronary artery without angina pectoris: Secondary | ICD-10-CM | POA: Diagnosis present

## 2021-05-18 DIAGNOSIS — Z5900 Homelessness unspecified: Secondary | ICD-10-CM

## 2021-05-18 DIAGNOSIS — G4700 Insomnia, unspecified: Secondary | ICD-10-CM | POA: Diagnosis present

## 2021-05-18 DIAGNOSIS — F17210 Nicotine dependence, cigarettes, uncomplicated: Secondary | ICD-10-CM | POA: Diagnosis present

## 2021-05-18 DIAGNOSIS — N186 End stage renal disease: Secondary | ICD-10-CM

## 2021-05-18 DIAGNOSIS — N19 Unspecified kidney failure: Secondary | ICD-10-CM | POA: Diagnosis not present

## 2021-05-18 DIAGNOSIS — G9341 Metabolic encephalopathy: Secondary | ICD-10-CM | POA: Diagnosis present

## 2021-05-18 DIAGNOSIS — F332 Major depressive disorder, recurrent severe without psychotic features: Secondary | ICD-10-CM | POA: Diagnosis present

## 2021-05-18 DIAGNOSIS — E785 Hyperlipidemia, unspecified: Secondary | ICD-10-CM | POA: Diagnosis present

## 2021-05-18 DIAGNOSIS — E875 Hyperkalemia: Secondary | ICD-10-CM | POA: Diagnosis present

## 2021-05-18 DIAGNOSIS — Z9114 Patient's other noncompliance with medication regimen: Secondary | ICD-10-CM

## 2021-05-18 DIAGNOSIS — E11649 Type 2 diabetes mellitus with hypoglycemia without coma: Secondary | ICD-10-CM | POA: Diagnosis present

## 2021-05-18 DIAGNOSIS — Z79899 Other long term (current) drug therapy: Secondary | ICD-10-CM

## 2021-05-18 DIAGNOSIS — E8779 Other fluid overload: Secondary | ICD-10-CM

## 2021-05-18 DIAGNOSIS — E1122 Type 2 diabetes mellitus with diabetic chronic kidney disease: Secondary | ICD-10-CM | POA: Diagnosis present

## 2021-05-18 DIAGNOSIS — F17200 Nicotine dependence, unspecified, uncomplicated: Secondary | ICD-10-CM | POA: Diagnosis not present

## 2021-05-18 DIAGNOSIS — Z833 Family history of diabetes mellitus: Secondary | ICD-10-CM

## 2021-05-18 DIAGNOSIS — Z20822 Contact with and (suspected) exposure to covid-19: Secondary | ICD-10-CM | POA: Diagnosis present

## 2021-05-18 DIAGNOSIS — F1910 Other psychoactive substance abuse, uncomplicated: Secondary | ICD-10-CM | POA: Diagnosis not present

## 2021-05-18 DIAGNOSIS — Z992 Dependence on renal dialysis: Secondary | ICD-10-CM

## 2021-05-18 DIAGNOSIS — Z8249 Family history of ischemic heart disease and other diseases of the circulatory system: Secondary | ICD-10-CM

## 2021-05-18 DIAGNOSIS — F1410 Cocaine abuse, uncomplicated: Secondary | ICD-10-CM | POA: Diagnosis present

## 2021-05-18 DIAGNOSIS — G253 Myoclonus: Secondary | ICD-10-CM | POA: Diagnosis present

## 2021-05-18 DIAGNOSIS — F329 Major depressive disorder, single episode, unspecified: Secondary | ICD-10-CM | POA: Diagnosis not present

## 2021-05-18 DIAGNOSIS — R278 Other lack of coordination: Secondary | ICD-10-CM | POA: Diagnosis present

## 2021-05-18 DIAGNOSIS — M898X9 Other specified disorders of bone, unspecified site: Secondary | ICD-10-CM | POA: Diagnosis present

## 2021-05-18 DIAGNOSIS — E1151 Type 2 diabetes mellitus with diabetic peripheral angiopathy without gangrene: Secondary | ICD-10-CM | POA: Diagnosis present

## 2021-05-18 DIAGNOSIS — F1010 Alcohol abuse, uncomplicated: Secondary | ICD-10-CM | POA: Diagnosis present

## 2021-05-18 DIAGNOSIS — E8770 Fluid overload, unspecified: Secondary | ICD-10-CM | POA: Diagnosis present

## 2021-05-18 LAB — CBC WITH DIFFERENTIAL/PLATELET
Abs Immature Granulocytes: 0.01 10*3/uL (ref 0.00–0.07)
Basophils Absolute: 0 10*3/uL (ref 0.0–0.1)
Basophils Relative: 0 %
Eosinophils Absolute: 0.1 10*3/uL (ref 0.0–0.5)
Eosinophils Relative: 2 %
HCT: 25.3 % — ABNORMAL LOW (ref 39.0–52.0)
Hemoglobin: 8 g/dL — ABNORMAL LOW (ref 13.0–17.0)
Immature Granulocytes: 0 %
Lymphocytes Relative: 20 %
Lymphs Abs: 0.6 10*3/uL — ABNORMAL LOW (ref 0.7–4.0)
MCH: 32.7 pg (ref 26.0–34.0)
MCHC: 31.6 g/dL (ref 30.0–36.0)
MCV: 103.3 fL — ABNORMAL HIGH (ref 80.0–100.0)
Monocytes Absolute: 0.4 10*3/uL (ref 0.1–1.0)
Monocytes Relative: 13 %
Neutro Abs: 2.1 10*3/uL (ref 1.7–7.7)
Neutrophils Relative %: 65 %
Platelets: 126 10*3/uL — ABNORMAL LOW (ref 150–400)
RBC: 2.45 MIL/uL — ABNORMAL LOW (ref 4.22–5.81)
RDW: 19.4 % — ABNORMAL HIGH (ref 11.5–15.5)
WBC: 3.2 10*3/uL — ABNORMAL LOW (ref 4.0–10.5)
nRBC: 0 % (ref 0.0–0.2)

## 2021-05-18 LAB — COMPREHENSIVE METABOLIC PANEL
ALT: 9 U/L (ref 0–44)
AST: <5 U/L — ABNORMAL LOW (ref 15–41)
Albumin: 3.3 g/dL — ABNORMAL LOW (ref 3.5–5.0)
Alkaline Phosphatase: 62 U/L (ref 38–126)
Anion gap: 26 — ABNORMAL HIGH (ref 5–15)
BUN: 213 mg/dL — ABNORMAL HIGH (ref 8–23)
CO2: 18 mmol/L — ABNORMAL LOW (ref 22–32)
Calcium: 10 mg/dL (ref 8.9–10.3)
Chloride: 100 mmol/L (ref 98–111)
Creatinine, Ser: 27.19 mg/dL — ABNORMAL HIGH (ref 0.61–1.24)
GFR, Estimated: 2 mL/min — ABNORMAL LOW (ref >60)
Glucose, Bld: 106 mg/dL — ABNORMAL HIGH (ref 70–99)
Potassium: 6.5 mmol/L (ref 3.5–5.1)
Sodium: 144 mmol/L (ref 135–145)
Total Bilirubin: 0.9 mg/dL (ref 0.3–1.2)
Total Protein: 7.3 g/dL (ref 6.5–8.1)

## 2021-05-18 LAB — RESP PANEL BY RT-PCR (FLU A&B, COVID) ARPGX2
Influenza A by PCR: NEGATIVE
Influenza B by PCR: NEGATIVE
SARS Coronavirus 2 by RT PCR: NEGATIVE

## 2021-05-18 LAB — IRON AND TIBC
Iron: 47 ug/dL (ref 45–182)
Saturation Ratios: 19 % (ref 17.9–39.5)
TIBC: 248 ug/dL — ABNORMAL LOW (ref 250–450)
UIBC: 201 ug/dL

## 2021-05-18 LAB — MAGNESIUM: Magnesium: 2.7 mg/dL — ABNORMAL HIGH (ref 1.7–2.4)

## 2021-05-18 LAB — RENAL FUNCTION PANEL
Albumin: 2.9 g/dL — ABNORMAL LOW (ref 3.5–5.0)
Anion gap: 22 — ABNORMAL HIGH (ref 5–15)
BUN: 213 mg/dL — ABNORMAL HIGH (ref 8–23)
CO2: 18 mmol/L — ABNORMAL LOW (ref 22–32)
Calcium: 9.8 mg/dL (ref 8.9–10.3)
Chloride: 101 mmol/L (ref 98–111)
Creatinine, Ser: 28.57 mg/dL — ABNORMAL HIGH (ref 0.61–1.24)
GFR, Estimated: 1 mL/min — ABNORMAL LOW (ref >60)
Glucose, Bld: 106 mg/dL — ABNORMAL HIGH (ref 70–99)
Phosphorus: 9 mg/dL — ABNORMAL HIGH (ref 2.5–4.6)
Potassium: 6.1 mmol/L — ABNORMAL HIGH (ref 3.5–5.1)
Sodium: 141 mmol/L (ref 135–145)

## 2021-05-18 LAB — FERRITIN: Ferritin: 304 ng/mL (ref 24–336)

## 2021-05-18 LAB — GLUCOSE, CAPILLARY
Glucose-Capillary: 70 mg/dL (ref 70–99)
Glucose-Capillary: 113 mg/dL — ABNORMAL HIGH (ref 70–99)

## 2021-05-18 LAB — PHOSPHORUS: Phosphorus: 9.1 mg/dL — ABNORMAL HIGH (ref 2.5–4.6)

## 2021-05-18 LAB — ETHANOL: Alcohol, Ethyl (B): <10 mg/dL (ref <10)

## 2021-05-18 MED ORDER — IPRATROPIUM-ALBUTEROL 0.5-2.5 (3) MG/3ML IN SOLN: 3.0000 mL | Freq: Four times a day (QID) | RESPIRATORY_TRACT | Status: DC | PRN

## 2021-05-18 MED ORDER — CINACALCET HCL 30 MG PO TABS
180.0000 mg | ORAL_TABLET | ORAL | Status: DC
  Administered 2021-05-20 – 2021-05-25 (×3): 180 mg via ORAL
  Filled 2021-05-18 (×3): qty 6

## 2021-05-18 MED ORDER — CARVEDILOL 6.25 MG PO TABS
6.2500 mg | ORAL_TABLET | Freq: Two times a day (BID) | ORAL | Status: DC
  Administered 2021-05-20 – 2021-05-24 (×8): 6.25 mg via ORAL
  Filled 2021-05-18 (×11): qty 1

## 2021-05-18 MED ORDER — RENA-VITE PO TABS
1.0000 | ORAL_TABLET | Freq: Every day | ORAL | Status: DC
  Administered 2021-05-19 – 2021-05-24 (×6): 1 via ORAL
  Filled 2021-05-18 (×8): qty 1

## 2021-05-18 MED ORDER — AMLODIPINE BESYLATE 5 MG PO TABS
2.5000 mg | ORAL_TABLET | Freq: Every day | ORAL | Status: DC
  Administered 2021-05-18 – 2021-05-19 (×2): 2.5 mg via ORAL
  Filled 2021-05-18 (×2): qty 1

## 2021-05-18 MED ORDER — DEXTROSE 50 % IV SOLN
1.0000 | Freq: Once | INTRAVENOUS | Status: AC
  Administered 2021-05-18: 50 mL via INTRAVENOUS
  Filled 2021-05-18: qty 50

## 2021-05-18 MED ORDER — SERTRALINE HCL 50 MG PO TABS
25.0000 mg | ORAL_TABLET | Freq: Every day | ORAL | Status: DC
  Administered 2021-05-18 – 2021-05-20 (×3): 25 mg via ORAL
  Filled 2021-05-18 (×3): qty 1

## 2021-05-18 MED ORDER — LIDOCAINE HCL (PF) 1 % IJ SOLN: 5.0000 mL | INTRAMUSCULAR | Status: DC | PRN

## 2021-05-18 MED ORDER — NICOTINE 14 MG/24HR TD PT24
14.0000 mg | MEDICATED_PATCH | Freq: Every day | TRANSDERMAL | Status: DC
  Administered 2021-05-19 – 2021-05-25 (×6): 14 mg via TRANSDERMAL
  Filled 2021-05-18 (×6): qty 1

## 2021-05-18 MED ORDER — PENTAFLUOROPROP-TETRAFLUOROETH EX AERO
1.0000 "application " | INHALATION_SPRAY | CUTANEOUS | Status: DC | PRN
  Filled 2021-05-18: qty 116

## 2021-05-18 MED ORDER — ADULT MULTIVITAMIN W/MINERALS CH: 1.0000 | ORAL_TABLET | Freq: Every day | ORAL | Status: DC

## 2021-05-18 MED ORDER — SEVELAMER CARBONATE 800 MG PO TABS
3200.0000 mg | ORAL_TABLET | Freq: Three times a day (TID) | ORAL | Status: DC
  Administered 2021-05-19 – 2021-05-25 (×14): 3200 mg via ORAL
  Filled 2021-05-18 (×15): qty 4

## 2021-05-18 MED ORDER — SODIUM CHLORIDE 0.9 % IV SOLN: 100.0000 mL | INTRAVENOUS | Status: DC | PRN

## 2021-05-18 MED ORDER — CHLORHEXIDINE GLUCONATE CLOTH 2 % EX PADS
6.0000 | MEDICATED_PAD | Freq: Every day | CUTANEOUS | Status: DC
  Administered 2021-05-22 – 2021-05-23 (×2): 6 via TOPICAL

## 2021-05-18 MED ORDER — POLYETHYLENE GLYCOL 3350 17 G PO PACK: 17.0000 g | PACK | Freq: Every day | ORAL | Status: DC | PRN

## 2021-05-18 MED ORDER — ENOXAPARIN SODIUM 30 MG/0.3ML IJ SOSY: 30.0000 mg | PREFILLED_SYRINGE | INTRAMUSCULAR | Status: DC

## 2021-05-18 MED ORDER — DARBEPOETIN ALFA 200 MCG/0.4ML IJ SOSY
200.0000 ug | PREFILLED_SYRINGE | INTRAMUSCULAR | Status: DC
Start: 2021-05-18 — End: 2021-05-26
  Administered 2021-05-18 – 2021-05-25 (×2): 200 ug via INTRAVENOUS
  Filled 2021-05-18 (×4): qty 0.4

## 2021-05-18 MED ORDER — HEPARIN SODIUM (PORCINE) 5000 UNIT/ML IJ SOLN
5000.0000 [IU] | Freq: Three times a day (TID) | INTRAMUSCULAR | Status: DC
  Administered 2021-05-18 – 2021-05-25 (×18): 5000 [IU] via SUBCUTANEOUS
  Filled 2021-05-18 (×19): qty 1

## 2021-05-18 MED ORDER — LIDOCAINE-PRILOCAINE 2.5-2.5 % EX CREA
1.0000 "application " | TOPICAL_CREAM | CUTANEOUS | Status: DC | PRN
  Filled 2021-05-18: qty 5

## 2021-05-18 MED ORDER — ROSUVASTATIN CALCIUM 5 MG PO TABS
10.0000 mg | ORAL_TABLET | Freq: Every day | ORAL | Status: DC
  Administered 2021-05-18 – 2021-05-25 (×8): 10 mg via ORAL
  Filled 2021-05-18 (×8): qty 2

## 2021-05-18 MED ORDER — SODIUM CHLORIDE 0.9 % IV SOLN
250.0000 mg | INTRAVENOUS | Status: AC
  Administered 2021-05-20 – 2021-05-22 (×2): 250 mg via INTRAVENOUS
  Filled 2021-05-18 (×2): qty 20

## 2021-05-18 MED ORDER — NEPRO/CARBSTEADY PO LIQD
237.0000 mL | Freq: Two times a day (BID) | ORAL | Status: DC
  Administered 2021-05-18 – 2021-05-25 (×10): 237 mL via ORAL
  Filled 2021-05-18: qty 237

## 2021-05-18 MED ORDER — METOPROLOL SUCCINATE ER 50 MG PO TB24
50.0000 mg | ORAL_TABLET | Freq: Every day | ORAL | Status: DC
  Filled 2021-05-18: qty 1

## 2021-05-18 MED ORDER — SODIUM ZIRCONIUM CYCLOSILICATE 10 G PO PACK
10.0000 g | PACK | Freq: Once | ORAL | Status: AC
  Administered 2021-05-18: 10 g via ORAL
  Filled 2021-05-18: qty 1

## 2021-05-18 MED ORDER — ACETAMINOPHEN 650 MG RE SUPP: 650.0000 mg | Freq: Four times a day (QID) | RECTAL | Status: DC | PRN

## 2021-05-18 MED ORDER — FOLIC ACID 1 MG PO TABS
1.0000 mg | ORAL_TABLET | Freq: Every day | ORAL | Status: DC
  Administered 2021-05-19 – 2021-05-25 (×7): 1 mg via ORAL
  Filled 2021-05-18 (×8): qty 1

## 2021-05-18 MED ORDER — ALBUTEROL SULFATE (2.5 MG/3ML) 0.083% IN NEBU
5.0000 mg | INHALATION_SOLUTION | Freq: Once | RESPIRATORY_TRACT | Status: AC
  Administered 2021-05-18: 5 mg via RESPIRATORY_TRACT
  Filled 2021-05-18: qty 6

## 2021-05-18 MED ORDER — IPRATROPIUM BROMIDE 0.02 % IN SOLN
0.5000 mg | Freq: Once | RESPIRATORY_TRACT | Status: AC
  Administered 2021-05-18: 0.5 mg via RESPIRATORY_TRACT
  Filled 2021-05-18: qty 2.5

## 2021-05-18 MED ORDER — THIAMINE HCL 100 MG PO TABS
100.0000 mg | ORAL_TABLET | Freq: Every day | ORAL | Status: DC
  Administered 2021-05-18 – 2021-05-25 (×8): 100 mg via ORAL
  Filled 2021-05-18 (×8): qty 1

## 2021-05-18 MED ORDER — ACETAMINOPHEN 325 MG PO TABS
650.0000 mg | ORAL_TABLET | Freq: Four times a day (QID) | ORAL | Status: DC | PRN
  Administered 2021-05-20 – 2021-05-21 (×2): 650 mg via ORAL
  Filled 2021-05-18 (×2): qty 2

## 2021-05-18 NOTE — ED Triage Notes (Addendum)
Pt bib [facility] c/o not feeling right. Pt has not been to dialysis in 2 weeks. States he is not right, that hes in a fog and not apart of this world. Pt says he is weak and tired, has not been taking his meds. Tuesday, Thursday, and Saturday dialysis pt.

## 2021-05-18 NOTE — Consult Note (Addendum)
[facility] Renal Consultation Note    Indication for Consultation:  Management of ESRD/hemodialysis; anemia, hypertension/volume and secondary hyperparathyroidism PCP: Dustin Folks NP Nephrologist: Dr. Joelyn Oms  HPI: Raymond [name] is a 65 y.o. male with ESRD on hemodialysis T,Th,S at [facility]. Last HD 05/01/2021. He presented to ED today with C/O "not feeling right" after missing six consecutive dialysis treatments. He has not been taking home meds.  PMH: HTN,CAD,PAD, Polysubstance abuse, homelessness, Hepatitis C, medical noncompliance. Recent admit 04-03-09/12/2020 with [facility].   Upon arrival to ED, BP 226/115 SCr 27.2 BUN 213 K+ 6.5 CO2 18 PO4 9.0 HGB 8.0. CXR with mild pulmonary edema. Will order serial HD to slowly lower BUN to avoid dialysis disequilibrium syndrome. Given his history of medical noncompliance, safest option to admit him rather than to do dialysis and discharge home, hoping he MIGHT go to his dialysis center.   Seen in HD. Oriented to self only. Speech garbled, says he can't think. Myoclonus UE and LE, asterixis present. Trace-1+Bilateral LE edema. Will have HD today and serial HD. Unable to investigate social issues now D/T AMS.   Past Medical History:  Diagnosis Date   Anemia    CAD (coronary artery disease)    a. 03/2018: cath showing 14 to 30% stenosis along the LAD, luminal irregularities along the RCA, and angiographically normal LCx   COPD (chronic obstructive pulmonary disease) (HCC)    ESRD (end stage renal disease) on dialysis [facility])    "MWF; Jeneen Rinks" (07/22/17)   Hepatitis C    Still positive s/p liver biopsy at [facility]  and interferon therapy for 6 months. Most recent lab work was on 10/24/12   Hepatitis C    "took the tx; gone now" (12/05/2016)  Was treated   History of blood transfusion ~ 2012/2013   "related to my kidneys; blood was low"   Hypertension    Peripheral arterial disease ([place])    Substance abuse ([place])     Thyroid disease    Type 2 diabetes mellitus with other diabetic kidney complication ([place]) 5/63/8756   Past Surgical History:  Procedure Laterality Date   ABDOMINAL AORTOGRAM W/LOWER EXTREMITY N/A 02/08/2018   Procedure: ABDOMINAL AORTOGRAM W/LOWER EXTREMITY;  Surgeon: Conrad Hoven, MD;  Location: [place] CV [facility];  Service: Cardiovascular;  Laterality: N/A;   ABDOMINAL AORTOGRAM W/LOWER EXTREMITY Bilateral 06/15/2020   Procedure: ABDOMINAL AORTOGRAM W/LOWER EXTREMITY;  Surgeon: Waynetta Sandy, MD;  Location: [place] [facility];  Service: Cardiovascular;  Laterality: Bilateral;   AV FISTULA PLACEMENT Left Aug. 2013 ?   BIOPSY  01/17/2020   Procedure: BIOPSY;  Surgeon: Jackquline Denmark, MD;  Location: [place];  Service: Endoscopy;;   COLONOSCOPY WITH PROPOFOL N/A 08/21/2018   Procedure: COLONOSCOPY WITH PROPOFOL;  Surgeon: Yetta Flock, MD;  Location: [place];  Service: Gastroenterology;  Laterality: N/A;   ENTEROSCOPY N/A 01/17/2020   Procedure: ENTEROSCOPY;  Surgeon: Jackquline Denmark, MD;  Location: [facility] ENDOSCOPY;  Service: Endoscopy;  Laterality: N/A;   ENTEROSCOPY N/A 07/31/2020   Procedure: ENTEROSCOPY;  Surgeon: Doran Stabler, MD;  Location: [place];  Service: Gastroenterology;  Laterality: N/A;   ENTEROSCOPY N/A 01/27/2021   Procedure: ENTEROSCOPY;  Surgeon: Yetta Flock, MD;  Location: [facility] ENDOSCOPY;  Service: Gastroenterology;  Laterality: N/A;   ESOPHAGOGASTRODUODENOSCOPY (EGD) WITH PROPOFOL N/A 08/20/2018   Procedure: ESOPHAGOGASTRODUODENOSCOPY (EGD) WITH PROPOFOL;  Surgeon: Gatha Mayer, MD;  Location: [place];  Service: Endoscopy;  Laterality: N/A;   FEMORAL-POPLITEAL  BYPASS GRAFT Left 04/11/2018   Procedure: BYPASS GRAFT FEMORAL-POPLITEAL ARTERY LEFT LEG;  Surgeon: Rosetta Posner, MD;  Location: [facility] OR;  Service: Vascular;  Laterality: Left;   GIVENS CAPSULE STUDY N/A 07/31/2020   Procedure: GIVENS CAPSULE STUDY;  Surgeon: Doran Stabler,  MD;  Location: [place];  Service: Gastroenterology;  Laterality: N/A;   HOT HEMOSTASIS N/A 08/20/2018   Procedure: HOT HEMOSTASIS (ARGON PLASMA COAGULATION/BICAP);  Surgeon: Gatha Mayer, MD;  Location: [facility] ENDOSCOPY;  Service: Endoscopy;  Laterality: N/A;   HOT HEMOSTASIS N/A 01/17/2020   Procedure: HOT HEMOSTASIS (ARGON PLASMA COAGULATION/BICAP);  Surgeon: Jackquline Denmark, MD;  Location: [facility] ENDOSCOPY;  Service: Endoscopy;  Laterality: N/A;   HOT HEMOSTASIS N/A 01/27/2021   Procedure: HOT HEMOSTASIS (ARGON PLASMA COAGULATION/BICAP);  Surgeon: Yetta Flock, MD;  Location: [facility] ENDOSCOPY;  Service: Gastroenterology;  Laterality: N/A;   LEFT HEART CATH AND CORONARY ANGIOGRAPHY N/A 03/29/2018   Procedure: LEFT HEART CATH AND CORONARY ANGIOGRAPHY;  Surgeon: Nelva Bush, MD;  Location: [place] CV [facility];  Service: Cardiovascular;  Laterality: N/A;   LIVER BIOPSY     ORIF ULNAR FRACTURE Left 05/18/2017   Procedure: OPEN REDUCTION INTERNAL FIXATION (ORIF) ULNAR FRACTURE;  Surgeon: Iran Planas, MD;  Location: [place];  Service: Orthopedics;  Laterality: Left;   PERIPHERAL VASCULAR INTERVENTION Right 06/15/2020   Procedure: PERIPHERAL VASCULAR INTERVENTION;  Surgeon: Waynetta Sandy, MD;  Location: [place] CV LAB;  Service: Cardiovascular;  Laterality: Right;   POLYPECTOMY  08/21/2018   Procedure: POLYPECTOMY;  Surgeon: Yetta Flock, MD;  Location: [place];  Service: Gastroenterology;;   Earney Mallet N/A 12/11/2012   Procedure: Earney Mallet;  Surgeon: Serafina Mitchell, MD;  Location: [facility] CATH LAB;  Service: Cardiovascular;  Laterality: N/A;   WOUND EXPLORATION Left 06/15/2020   Procedure: GROIN  EXPLORATION;  Surgeon: Waynetta Sandy, MD;  Location: [facility] OR;  Service: Vascular;  Laterality: Left;   Family History  Problem Relation Age of Onset   Diabetes Father    Hypertension Father    Heart disease Father    Social History:  reports that he has been smoking  cigarettes. He has a 5.00 pack-year smoking history. He has never used smokeless tobacco. He reports current alcohol use of about 10.0 standard drinks per week. He reports that he does not currently use drugs after having used the following drugs: Cocaine. No Known Allergies Prior to Admission medications   Medication Sig Start Date End Date Taking? Authorizing Provider  albuterol (VENTOLIN HFA) 108 (90 Base) MCG/ACT inhaler Inhale 2 puffs into the lungs every 4 (four) hours as needed for shortness of breath or wheezing. 11/06/19   [provider]  amLODipine (NORVASC) 2.5 MG tablet Take 2.5 mg by mouth daily. Take 1 tablet by mouth every night    [provider]  cinacalcet (SENSIPAR) 30 MG tablet Take 6 tablets (180 mg total) by mouth Every Tuesday,Thursday,and Saturday with dialysis. 12/12/20   Mercy Riding, MD  dextrose (GLUTOSE) 40 % GEL Take 31 g by mouth every 4 (four) hours. 04/15/21   Georgette Shell, MD  folic acid (FOLVITE) 1 MG tablet Take 1 tablet (1 mg total) by mouth daily. 12/12/20   Mercy Riding, MD  metoprolol succinate (TOPROL-XL) 50 MG 24 hr tablet Take 50 mg by mouth daily. Take 2 tablets by mouth every day with or immediately following a meal.    [provider]  Multiple Vitamin (MULTIVITAMIN WITH MINERALS) TABS tablet Take  1 tablet by mouth daily. 12/12/20   Mercy Riding, MD  nicotine (NICODERM CQ - DOSED IN MG/24 HOURS) 14 mg/24hr patch Place 1 patch (14 mg total) onto the skin daily. 12/12/20   Mercy Riding, MD  pantoprazole (PROTONIX) 40 MG tablet Take 40 mg by mouth daily.    [provider]  rosuvastatin (CRESTOR) 10 MG tablet Take 1 tablet (10 mg total) by mouth daily. 12/11/20 06/09/21  Mercy Riding, MD  sertraline (ZOLOFT) 25 MG tablet Take 25 mg by mouth daily. 02/24/21   [provider]  sevelamer carbonate (RENVELA) 800 MG tablet Take 4 tablets (3,200 mg total) by mouth 3 (three) times daily with meals. 09/14/18   Monica Becton, MD  thiamine 100 MG tablet Take 1 tablet (100 mg total) by mouth daily. 12/12/20   Mercy Riding, MD  apixaban (ELIQUIS) 5 MG TABS tablet Take 1 tablet (5 mg total) by mouth 2 (two) times daily. 06/22/20 08/03/20  Karoline Caldwell, PA-C   Current Facility-Administered Medications  Medication Dose Route Frequency Provider Last Rate Last Admin   sodium zirconium cyclosilicate (LOKELMA) packet 10 g  10 g Oral Once Blanchie Dessert, MD       Current Outpatient Medications  Medication Sig Dispense Refill   albuterol (VENTOLIN HFA) 108 (90 Base) MCG/ACT inhaler Inhale 2 puffs into the lungs every 4 (four) hours as needed for shortness of breath or wheezing.     amLODipine (NORVASC) 2.5 MG tablet Take 2.5 mg by mouth daily. Take 1 tablet by mouth every night     cinacalcet (SENSIPAR) 30 MG tablet Take 6 tablets (180 mg total) by mouth Every Tuesday,Thursday,and Saturday with dialysis. 60 tablet 1   dextrose (GLUTOSE) 40 % GEL Take 31 g by mouth every 4 (four) hours.     folic acid (FOLVITE) 1 MG tablet Take 1 tablet (1 mg total) by mouth daily. 90 tablet 1   metoprolol succinate (TOPROL-XL) 50 MG 24 hr tablet Take 50 mg by mouth daily. Take 2 tablets by mouth every day with or immediately following a meal.     Multiple Vitamin (MULTIVITAMIN WITH MINERALS) TABS tablet Take 1 tablet by mouth daily. 90 tablet 1   nicotine (NICODERM CQ - DOSED IN MG/24 HOURS) 14 mg/24hr patch Place 1 patch (14 mg total) onto the skin daily. 28 patch 0   pantoprazole (PROTONIX) 40 MG tablet Take 40 mg by mouth daily.     rosuvastatin (CRESTOR) 10 MG tablet Take 1 tablet (10 mg total) by mouth daily. 90 tablet 1   sertraline (ZOLOFT) 25 MG tablet Take 25 mg by mouth daily.     sevelamer carbonate (RENVELA) 800 MG tablet Take 4 tablets (3,200 mg total) by mouth 3 (three) times daily with meals. 120 tablet 0   thiamine 100 MG tablet Take 1 tablet (100 mg total) by mouth daily. 90 tablet 1   Facility-Administered  Medications Ordered in Other Encounters  Medication Dose Route Frequency Provider Last Rate Last Admin   midazolam (VERSED) 5 MG/5ML injection    Anesthesia Intra-op Sammie Bench, CRNA   2 mg at 04/11/18 1042   Labs: Basic Metabolic Panel: Recent Labs  Lab 05/18/21 1209  NA 144  K 6.5*  CL 100  CO2 18*  GLUCOSE 106*  BUN 213*  CREATININE 27.19*  CALCIUM 10.0  PHOS 9.1*   Liver Function Tests: Recent Labs  Lab 05/18/21 1209  AST <5*  ALT 9  ALKPHOS 62  BILITOT 0.9  PROT 7.3  ALBUMIN 3.3*   No results for input(s): LIPASE, AMYLASE in the last 168 hours. No results for input(s): AMMONIA in the last 168 hours. CBC: Recent Labs  Lab 05/18/21 1209  WBC 3.2*  NEUTROABS 2.1  HGB 8.0*  HCT 25.3*  MCV 103.3*  PLT 126*   Cardiac Enzymes: No results for input(s): CKTOTAL, CKMB, CKMBINDEX, TROPONINI in the last 168 hours. CBG: No results for input(s): GLUCAP in the last 168 hours. Iron Studies: No results for input(s): IRON, TIBC, TRANSFERRIN, FERRITIN in the last 72 hours. Studies/Results: DG Chest Port 1 View  Result Date: 05/18/2021 CLINICAL DATA:  Dyspnea EXAM: PORTABLE CHEST 1 VIEW COMPARISON:  04/03/2021 chest radiograph. FINDINGS: Stable cardiomediastinal silhouette with moderate cardiomegaly. No pneumothorax. No pleural effusion. Borderline mild pulmonary edema. No acute consolidative airspace disease. IMPRESSION: Borderline mild congestive heart failure. Electronically Signed   By: Ilona Sorrel M.D.   On: 05/18/2021 11:57    ROS: As per HPI otherwise negative.   Physical Exam: Vitals:   05/18/21 1300 05/18/21 1315 05/18/21 1330 05/18/21 1430  BP: (!) 162/85 (!) 176/91 (!) 162/81 (!) 178/96  Pulse: 87 83 79 75  Resp: 15 14 15 16   Temp:      SpO2: 100% 100% 100% 97%  Weight:      Height:         General: Chronically ill appearing male looks older that stated again in no acute distress. Head: Normocephalic, atraumatic, sclera non-icteric, mucus  membranes are moist Neck: Supple. JVD 1/2 to mandible. Lungs: Bilateral breath sounds few bibasilar crackles, few scattered fine end expiratory wheezes, decreased in bases bilaterally. [name]  Heart: SR on monitor. S1,S2 no M/R/G Abdomen: Soft, non-tender, non-distended with normoactive bowel sounds. No rebound/guarding. No obvious abdominal masses. M-S:  Strength and tone appear normal for age. Lower extremities: Neuro: Alert and oriented X 1-2. Myoclonus noted upper and lower extremities. Asterixis present.  Psych: speech is rambling.  Dialysis Access: L AVF +T/B  Dialysis Orders: Center: [facility] T,Th,S 4 hrs 180NRe 400/Autoflow 1.5 68.8 kg 2.0K/2.0 Ca UFP 4 AVF -No Heparin -Cinacalcet 180 mg PO TIW -Mircera 200 mcg IV q 2 weeks (last dose 04/29/2021) -Calcitriol 2.75 mcg PO TIW  Assessment/Plan:  Uremia D/T noncompliance with HD. SCr 27.2 BUN>200. Slow dialyisis 500/250 2.5 hours X 2 treatments, then 600/300 X 1 then resume previous orders.  Hyperkalemia: In setting of missed HD. Lokelma 10 grams given in ED. Can use 2.0 K bath. Follow labs.  AMS: Secondary to uremia in setting of noncompliance with HD. BUN >200. Should improve with HD. Avoid HD disequilibrium syndrome.   ESRD -  T,Th,S Has not been to treatment since 05/01/2021. HD today, again tomorrow and again 05/20/2021   Hypertension/volume  - Resume home meds. DC'd Metoprolol-patient recently used  and has history of cocaine abuse. Will start Carvedilol 12.5 mg PO BID.  UF as tolerated.   Anemia  - HGB 8.0. No ESA since 04/29/21. Give Aranesp 200 mcg IV today. Check Fe panel. Load with Fe.   Metabolic bone disease -  C Ca 10.6. Hold VDRA. PO4 elevated. Resume senispar and binders (Renvela)  Nutrition - Albumin 3.3 Renal diet, nepro  Tobacco/polysubstance abuse-per primary Social issues: Long standing issues with polysubstance abuse, homelessness. Consult MSW. Consider SNF placement if he is appropriate and agrees to go. Unable to  establish goals of care until uremia clears and baseline mental status returns.    Sameena Artus H. Owens Shark, NP-C  05/18/2021, 2:52 PM  [employer] [phone]
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
## 2021-05-18 NOTE — H&P (Signed)
Date: 05/18/2021               Patient Name:  Raymond [name] MRN: [id number]  DOB: 03-02-1956 Age / Sex: 65 y.o., male   PCP: [name], FNP         Medical Service: Internal Medicine Teaching Service         Attending Physician: Dr. Velna Ochs, MD    First Contact: Dr. Lurline Hare Pager: [phone]  Second Contact: Dr. Alfonse Spruce Pager: [phone]       After Hours (After 5p/  First Contact Pager: [phone]  weekends / holidays): Second Contact Pager: [phone]   Chief Complaint: Altered Mental Status  History of Present Illness:  Patient is a 65 year old male with history of ESRD on hemodialysis Tuesday, Thursday, Saturday, COPD, hep C treated, pancytopenia, hypertension, CAD, PAD, COPD presented to the ED on 05/18/2021 with altered mental status.  Of note, patient was recently admitted (04/03/2021-04/15/2021) due to hypoglycemia and unresponsiveness.  Patient had experienced PEA arrest in the field and required admission to the ICU.  This had also occurred 12/11/2020 and 01/20/2021 with similar presentation of altered mental status and hypoglycemia.   Patient reports that he came to the hospital because he was having worsening depression and "felt like something was off".  Patient stated that he had this feeling that "no one knew what was going on with me".  Patient reports that he had not been taking any of his medications since last hospitalization because "I am on a lot of meds".  Patient also felt that he did not want to go to dialysis because "I felt like giving up" and patient was endorsing worsening depression.  Patient reports "I do not know why I am still here".  Patient was alert and oriented to self, location, but not time (thought it was November 2021).  Patient stated that a quarter, a nickel, and a dime, made 36 cents.  Patient was hard to understand due to muttering.  Patient reports worsening depressed mood, poor sleep (reports uses medication to sleep but unable to specify which  medications), poor appetite, low energy, low concentration.  Patient reports that he has been depressed for the past year now but unable to cite specific cause of depression. Patient denies HI/AVH.  Patient reports previously being on Zoloft but has discontinued it at least since past hospitalization 2 months  ago.  Patient endorses mild shortness of breath that he attributes to not using his inhaler and having a history of COPD.  Patient endorses blurry vision and hearing deficits but patient's reports that this is chronic in nature.  Patient reports having headache, abdominal pain, mild nausea leg pain and swelling. Patient denies headache, chills, numbness, tingling, dysphagia, chest pain, constipation.  Patient reports that he does not make urine.   Patient reports having been to Dr. Tamala Julian for PCP follow-up after last hospitalization.  Meds: Per chart review, patient is supposed to be taking the following but has NOT BEEN TAKING: Albuterol inhaler as needed for shortness of breath or wheezing Amlodipine 2.5 mg nightly Cinacalcet 180 mg by mouth every Tuesday, Thursday, and Saturday with dialysis Folic acid 1 mg by mouth daily Toprol-XL 100 mg daily Multivitamin with mineral Nicotine patch 14 mg Protonix 40 mg daily Crestor 10 mg daily Zoloft 25 mg daily Sevelamer carbonate 3200 mg 3 times daily with meals Thiamine 100 mg daily  No outpatient medications have been marked as taking for the 05/18/21 encounter [facility] Encounter).  Allergies: Allergies as of 05/18/2021   (No Known Allergies)   Past Medical History:  Diagnosis Date   Anemia    CAD (coronary artery disease)    a. 03/2018: cath showing 20 to 30% stenosis along the LAD, luminal irregularities along the RCA, and angiographically normal LCx   COPD (chronic obstructive pulmonary disease) (HCC)    ESRD (end stage renal disease) on dialysis ([place])    "MWF; [facility]" (07/22/17)   Hepatitis C    Still positive s/p  liver biopsy at [facility]  and interferon therapy for 6 months. Most recent lab work was on 10/24/12   Hepatitis C    "took the tx; gone now" (12/05/2016)  Was treated   History of blood transfusion ~ 2012/2013   "related to my kidneys; blood was low"   Hypertension    Peripheral arterial disease ([place])    Substance abuse ([place])    Thyroid disease    Type 2 diabetes mellitus with other diabetic kidney complication ([place]) 5/59/7416    Family History: Per chart review, diabetes in father, heart disease in father, hypertension in father  Social History: Reports drinking 1/5 a day of alcohol, smokes half pack per day of tobacco, and smokes cocaine (last used 5 days ago)  Review of Systems: A complete ROS was negative except as per HPI.   Physical Exam: Blood pressure (!) 175/111, pulse 78, temperature 97.8 F (36.6 C), resp. rate 18, height 5\' 8"  (1.727 m), weight 72.6 kg, SpO2 100 %. Physical Exam Constitutional:      Appearance: He is ill-appearing.  HENT:     Head: Normocephalic and atraumatic.     Nose: Nose normal.  Cardiovascular:     Rate and Rhythm: Normal rate and regular rhythm.     Pulses: Normal pulses.     Heart sounds: Normal heart sounds.  Pulmonary:     Effort: Pulmonary effort is normal. No respiratory distress.     Breath sounds: Normal breath sounds. No stridor. No wheezing, rhonchi or rales.  Abdominal:     General: Abdomen is flat. Bowel sounds are normal.     Palpations: Abdomen is soft.  Musculoskeletal:     Comments: Myoclonus, lower and upper extremity  Skin:    General: Skin is warm and dry.     Capillary Refill: Capillary refill takes less than 2 seconds.  Neurological:     Mental Status: He is alert. He is disoriented.     Comments: Asterixis present  Psychiatric:     Comments: Patient reports mood "depressed" Flat affect    EKG: personally reviewed my interpretation is no heart block, no peaked T wave, no bradycardia  CXR: personally reviewed my  interpretation is mild congestive heart failure  Assessment & Plan by Problem: Patient is a 65 year old male with history of ESRD on hemodialysis Tuesday, Thursday, Saturday (non-compliant), medication noncompliance, COPD, hep C treated, pancytopenia, hypertension, CAD, PAD, COPD presented to the ED on 05/18/2021 with altered mental status. AMS likely due to missing HD. Depression may be contributory to current presentation as well.  Active Problems:   ESRD (end stage renal disease) on dialysis [facility])  ESRD Uremia Patient reports being noncompliant with hemodialysis since hospitalization since 10/6. Patient reports this is due to worsening depression but may also be due to hypoxic brain injury (due to PEA arrest last hospitalization) Upon evaluation, patient appears confused, disoriented, nauseous and reported anorexia.   -BUN 213 on admission -Cr 27.19 on admission -Nephrology already consulted. Greatly  appreciate assistance. -Recommended serial HD to slowly lower BUN to avoid dialysis disequilibrium syndrome -Continue renal function panel monitoring -Aranesp injection 200 mcg every Tuesday during HD -Sensipar 180 mg during HD  Hyperkalemia -Potassium 6.5, no hemolysis noted -No ECG changes noted -Lokelma 10 g once -Continue to monitor potassium  Hypertension likely 2/2 medication noncompliance -Recent measured 161/61 -HD will likely help also with hypertension -Restarted home meds:   -Amlodipine 2.5 mg qd  -Metoprolol XL 50 mg qd  Hyperphosphatemia -Phosphorus: 9.1 -Resumed home med Renvela 3200 mg qd tid  COPD -O2 sat 98% on room air -Ipratropium nebulizer 0.5 mg x1 in the ED -Duoneb 3 mL q6 hour PRN for wheezing/SOB  MDD Patient meets criteria for major depressive disorder. -Resume patient's home med Zoloft 25 mg daily -Recommend outpatient behavioral health follow-up for medication management and possibly therapy  Macrocytic Anemia likely due to chronic alcohol  use -Iron labs pending -Negative ethanol -B9/b12 normal last hospitalization 1 month ago  Hx of Hypoglycemia PEA arrest Patient has had multiple hospitalization due to hypoglycemia.  During previous hospitalization, insulinoma and adrenal insufficiency had been ruled out.  More likely that hypoglycemia is related to ESRD and poor p.o. intake at this time. -Continue to monitor CBG  Alcohol Use Disorder Substance Use Disorder Nicotine Use Disorder -Negative alcohol -CIWA w/o ativan ordered -Nicoderm patch 14 mg for nicotine replacement therapy  Dyslipidemia -Resumed home med Crestor 10 mg daily  Best practices: Code: Full code, will need to reassess when patient not altered Diet: Renal IVF: N/A DVT: Lovenox Dispo: Admit patient to Inpatient with expected length of stay greater than 2 midnights.  Signed: France Ravens, MD 05/18/2021, 5:22 PM  Pager: [phone] After 5pm on weekdays and 1pm on weekends: On Call pager: [phone]

## 2021-05-18 NOTE — Hospital Course (Addendum)
ESRD Uremia Patient reports being noncompliant with hemodialysis since hospitalization since 10/6. Patient reports this is due to worsening depression but may also be due to hypoxic brain injury (due to PEA arrest last hospitalization) Upon evaluation, patient appears confused, disoriented, nauseous and reported anorexia.  Patient's BUN was 213 on admission and creatinine of 27.19.  Nephrology was consulted and recommended serial hemodialysis to prevent dialysis disequilibrium syndrome.  Since resuming dialysis, patient's mentation has drastically improved and patient began to become more alert and oriented throughout hospitalization.  Patient's BUN near discharge is around 25.  Hyperkalemia -Potassium 6.5, no hemolysis noted -No ECG changes noted -Lokelma 10 g once -Continue to monitor potassium   Hypertension likely 2/2 medication noncompliance Patient's systolic blood pressure was around 160 when initially admitted.  Resume patient's home medications of amlodipine and titrated that up to 10 mg daily.  Switched patient's metoprolol to carvedilol given patient's cocaine use history.  Patient's blood pressure has been around 131/77 following medication adjustments.   Hyperphosphatemia Patient's phosphorus was 9.1 on admission.  Patient was resumed on Renvela and hemodialysis.  Patient's phosphorus is now at 3.6.   COPD Patient has been satting around 90% on room air.  Patient received 1 ipratropium nebulizer in the ED and has not required DuoNeb during hospitalization.   Depression Given poor patient history and uremic encephalopathy, uncertain whether patient had discontinued all medications and dialysis due to depression.  However, patient has had a history of depression and retrospectively reported that he may have been attempting to kill himself.  Psych was consulted and recommended increasing patient's Zoloft to 50 mg daily and recommend outpatient patient behavioral health follow-up as well  as substance use rehab.    Macrocytic Anemia likely due to chronic alcohol use Patient had a negative ethanol level this hospitalization and normal folate/B12 levels 1 month ago.  Patient is on aranesp during HD.   Hx of Hypoglycemia PEA arrest Patient has had multiple hospitalization due to hypoglycemia.  During previous hospitalization, insulinoma and adrenal insufficiency had been ruled out.  More likely that hypoglycemia is related to ESRD and poor p.o. intake at this time.   Alcohol Use Disorder Substance Use Disorder Nicotine Use Disorder Patient did not have symptoms of alcohol withdrawal during this admission. CIWA score ranged from 0-2.  NicoDerm patch was ordered for patient for smoking cessation.  Dyslipidemia Patient was resumed on home med Crestor 10 mg daily

## 2021-05-19 ENCOUNTER — Encounter (HOSPITAL_COMMUNITY): Payer: Self-pay | Admitting: Internal Medicine

## 2021-05-19 ENCOUNTER — Other Ambulatory Visit: Payer: Self-pay

## 2021-05-19 DIAGNOSIS — N19 Unspecified kidney failure: Secondary | ICD-10-CM

## 2021-05-19 DIAGNOSIS — Z992 Dependence on renal dialysis: Secondary | ICD-10-CM | POA: Diagnosis not present

## 2021-05-19 DIAGNOSIS — N186 End stage renal disease: Secondary | ICD-10-CM | POA: Diagnosis not present

## 2021-05-19 DIAGNOSIS — F332 Major depressive disorder, recurrent severe without psychotic features: Secondary | ICD-10-CM

## 2021-05-19 LAB — RENAL FUNCTION PANEL
Albumin: 2.7 g/dL — ABNORMAL LOW (ref 3.5–5.0)
Anion gap: 17 — ABNORMAL HIGH (ref 5–15)
BUN: 119 mg/dL — ABNORMAL HIGH (ref 8–23)
CO2: 23 mmol/L (ref 22–32)
Calcium: 9 mg/dL (ref 8.9–10.3)
Chloride: 100 mmol/L (ref 98–111)
Creatinine, Ser: 18.04 mg/dL — ABNORMAL HIGH (ref 0.61–1.24)
GFR, Estimated: 3 mL/min — ABNORMAL LOW (ref >60)
Glucose, Bld: 79 mg/dL (ref 70–99)
Phosphorus: 6.6 mg/dL — ABNORMAL HIGH (ref 2.5–4.6)
Potassium: 5 mmol/L (ref 3.5–5.1)
Sodium: 140 mmol/L (ref 135–145)

## 2021-05-19 LAB — CBC
HCT: 21.7 % — ABNORMAL LOW (ref 39.0–52.0)
Hemoglobin: 7 g/dL — ABNORMAL LOW (ref 13.0–17.0)
MCH: 32.4 pg (ref 26.0–34.0)
MCHC: 32.3 g/dL (ref 30.0–36.0)
MCV: 100.5 fL — ABNORMAL HIGH (ref 80.0–100.0)
Platelets: 101 10*3/uL — ABNORMAL LOW (ref 150–400)
RBC: 2.16 MIL/uL — ABNORMAL LOW (ref 4.22–5.81)
RDW: 19.4 % — ABNORMAL HIGH (ref 11.5–15.5)
WBC: 2.3 10*3/uL — ABNORMAL LOW (ref 4.0–10.5)
nRBC: 0 % (ref 0.0–0.2)

## 2021-05-19 LAB — MRSA NEXT GEN BY PCR, NASAL: MRSA by PCR Next Gen: NOT DETECTED

## 2021-05-19 LAB — HEPATITIS B SURFACE ANTIBODY,QUALITATIVE: Hep B S Ab: REACTIVE — AB

## 2021-05-19 LAB — HEPATITIS B SURFACE ANTIGEN: Hepatitis B Surface Ag: NONREACTIVE

## 2021-05-19 LAB — GLUCOSE, CAPILLARY: Glucose-Capillary: 108 mg/dL — ABNORMAL HIGH (ref 70–99)

## 2021-05-19 MED ORDER — SODIUM CHLORIDE 0.9 % IV SOLN: 100.0000 mL | INTRAVENOUS | Status: DC | PRN

## 2021-05-19 MED ORDER — LIDOCAINE HCL (PF) 1 % IJ SOLN: 5.0000 mL | INTRAMUSCULAR | Status: DC | PRN

## 2021-05-19 MED ORDER — PENTAFLUOROPROP-TETRAFLUOROETH EX AERO: 1.0000 "application " | INHALATION_SPRAY | CUTANEOUS | Status: DC | PRN

## 2021-05-19 MED ORDER — LIDOCAINE-PRILOCAINE 2.5-2.5 % EX CREA: 1.0000 "application " | TOPICAL_CREAM | CUTANEOUS | Status: DC | PRN

## 2021-05-19 NOTE — Evaluation (Signed)
Occupational Therapy Evaluation Patient Details Name: Raymond [name] MRN: [id number] DOB: January 18, 1956 Today's Date: 05/19/2021   History of Present Illness 65 y.o. male presents to [facility] hospital 05/18/2021 with AMS. Pt with 3 admissions since June 2022 for AMS and hypoglycemia, with PEA arrest during September admission. Pt reports worsening depression. PMH includes ESRD on HDU TTS, COPD, hep C, HTN, CAD, PAD, COPD.   Clinical Impression   Deloy was mod I PTA per report, no family present to confirm. Pt only answering yes/no questions at this time with increased time and cues, he was extremely lethargic and required maximal cues to stay alert and participate in the session. He is now requiring up to max A for ADLs, min A for bed mobility and max A for transfers due to lethargy and limited participation this session. Pt will benefit from continued OT acutely. Recommend SNF at d/c due to pt's  limited caregiver support.     Recommendations for follow up therapy are one component of a multi-disciplinary discharge planning process, led by the attending physician.  Recommendations may be updated based on patient status, additional functional criteria and insurance authorization.   Follow Up Recommendations  Skilled nursing-short term rehab (<3 hours/day)    Assistance Recommended at Discharge Frequent or constant Supervision/Assistance  Functional Status Assessment  Patient has had a recent decline in their functional status and/or demonstrates limited ability to make significant improvements in function in a reasonable and predictable amount of time  Equipment Recommendations  Other (comment) (defer to next venue of care)    Recommendations for Other Services       Precautions / Restrictions Precautions Precautions: Fall Restrictions Weight Bearing Restrictions: No      Mobility Bed Mobility Overal bed mobility: Needs Assistance Bed Mobility: Rolling;Sidelying to Sit Rolling: Min  assist Sidelying to sit: Min assist       General bed mobility comments: min A to initiate movement and pt would compelte, likely due to lethargy    Transfers Overall transfer level: Needs assistance Equipment used: 1 person hand held assist Transfers: Sit to/from Stand;Bed to chair/wheelchair/BSC Sit to Stand: Mod assist Stand pivot transfers: Mod assist         General transfer comment: despite max encouragement pt not willingto attempt transfer      Balance Overall balance assessment: Needs assistance Sitting-balance support: Single extremity supported;Bilateral upper extremity supported;Feet supported Sitting balance-Leahy Scale: Poor Sitting balance - Comments: reliant on UE support   Standing balance support: Bilateral upper extremity supported Standing balance-Leahy Scale: Poor Standing balance comment: min-modA with UE support of PT                           ADL either performed or assessed with clinical judgement   ADL Overall ADL's : Needs assistance/impaired Eating/Feeding: Set up;Sitting   Grooming: Set up;Sitting   Upper Body Bathing: Minimal assistance;Sitting   Lower Body Bathing: Maximal assistance;Sit to/from stand   Upper Body Dressing : Minimal assistance;Sitting   Lower Body Dressing: Maximal assistance;Sit to/from stand   Toilet Transfer: Maximal assistance   Toileting- Clothing Manipulation and Hygiene: Maximal assistance;Sit to/from stand       Functional mobility during ADLs: Maximal assistance General ADL Comments: increased assistance levels due to lethargy and limited participation from pt     Vision Baseline Vision/History: 0 No visual deficits Vision Assessment?: No apparent visual deficits            Pertinent Vitals/Pain  Pain Assessment: Faces Faces Pain Scale: Hurts a little bit Pain Location: LUE wtih over head ROM Pain Descriptors / Indicators: Grimacing Pain Intervention(s): Limited activity within  patient's tolerance;Monitored during session     Hand Dominance Right   Extremity/Trunk Assessment Upper Extremity Assessment Upper Extremity Assessment: LUE deficits/detail;Generalized weakness (globally 3+/5) LUE Deficits / Details: report of pain with over head ROM, actively resisting PROM. poor grip strength, could not complete thumb to finger coodination LUE Coordination: decreased fine motor   Lower Extremity Assessment Lower Extremity Assessment: Overall WFL for tasks assessed   Cervical / Trunk Assessment Cervical / Trunk Assessment: Normal   Communication Communication Communication: Expressive difficulties   Cognition Arousal/Alertness: Lethargic Behavior During Therapy: Flat affect Overall Cognitive Status: No family/caregiver present to determine baseline cognitive functioning                   General Comments: pt required max cues to stay alert, very lethargic. Only responded to yes/no questions with increased time.     General Comments  nasal canula off upon arrival, left off & O2 sat was 92-96% on RA throughout session            Home Living Family/patient expects to be discharged to:: Private residence Living Arrangements: Alone   Type of Home: Apartment Home Access: Level entry     Home Layout: One level     Bathroom Shower/Tub: Chief Strategy Officer: Standard     Home Equipment: Cane - single point   Additional Comments: pt responds to yes/no questions, is unable to provide details or answer other questions with multiple choices      Prior Functioning/Environment Prior Level of Function : Independent/Modified Independent             Mobility Comments: pt reports independence in mobility, no caregivers present to confirm          OT Problem List: Decreased strength;Decreased range of motion;Decreased activity tolerance;Impaired balance (sitting and/or standing);Decreased safety awareness;Decreased knowledge of  use of DME or AE;Decreased knowledge of precautions;Pain      OT Treatment/Interventions: Self-care/ADL training;Therapeutic exercise;Balance training;DME and/or AE instruction;Patient/family education;Therapeutic activities    OT Goals(Current goals can be found in the care plan section) Acute Rehab OT Goals Patient Stated Goal: did not state OT Goal Formulation: Patient unable to participate in goal setting Time For Goal Achievement: 06/02/21 Potential to Achieve Goals: Fair ADL Goals Pt Will Perform Grooming: with min guard assist;standing Pt Will Perform Lower Body Bathing: with supervision;sit to/from stand Pt Will Perform Lower Body Dressing: with supervision;sit to/from stand Pt Will Transfer to Toilet: with supervision;ambulating Pt/caregiver will Perform Home Exercise Program: Increased strength;Both right and left upper extremity;With written HEP provided  OT Frequency: Min 2X/week   Barriers to D/C: Decreased caregiver support  pt lives alone          AM-PAC OT "6 Clicks" Daily Activity     Outcome Measure Help from another person eating meals?: A Little Help from another person taking care of personal grooming?: A Little Help from another person toileting, which includes using toliet, bedpan, or urinal?: A Lot Help from another person bathing (including washing, rinsing, drying)?: A Lot Help from another person to put on and taking off regular upper body clothing?: A Little Help from another person to put on and taking off regular lower body clothing?: A Lot 6 Click Score: 15   End of Session Nurse Communication: Mobility status  Activity Tolerance: Patient limited  by lethargy Patient left: in bed;with call bell/phone within reach;with bed alarm set  OT Visit Diagnosis: Unsteadiness on feet (R26.81);Other abnormalities of gait and mobility (R26.89);Muscle weakness (generalized) (M62.81);Pain                Time: 0216-0230 OT Time Calculation (min): 14  min Charges:  OT General Charges $OT Visit: 1 Visit OT Evaluation $OT Eval Moderate Complexity: 1 Mod   Roshun Klingensmith A Janiesha Diehl 05/19/2021, 2:54 PM

## 2021-05-19 NOTE — Progress Notes (Signed)
[facility] Progress Note   Subjective:   tolerated HD last PM without issue - feeling some improved today.   Objective Vitals:   05/19/21 0145 05/19/21 0500 05/19/21 0630 05/19/21 0832  BP: (!) 164/97  (!) 151/72 (!) 157/87  Pulse: 83  80 77  Resp: 18  18 16   Temp: 98.9 F (37.2 C)  98.6 F (37 C) 98 F (36.7 C)  TempSrc: Axillary  Oral Oral  SpO2: 99%  99% 100%  Weight:  69.5 kg  70.4 kg  Height:       Physical Exam General: chronically ill appearing Heart: RRR, no rub Lungs: clear, 100% on 4L [place] Abdomen: soft nontender Extremities: no edema Dialysis Access:  L AVF +t/b, Qb 400  Additional Objective Labs: Basic Metabolic Panel: Recent Labs  Lab 05/18/21 1209 05/18/21 1635 05/19/21 0503  NA 144 141 140  K 6.5* 6.1* 5.0  CL 100 101 100  CO2 18* 18* 23  GLUCOSE 106* 106* 79  BUN 213* 213* 119*  CREATININE 27.19* 28.57* 18.04*  CALCIUM 10.0 9.8 9.0  PHOS 9.1* 9.0* 6.6*   Liver Function Tests: Recent Labs  Lab 05/18/21 1209 05/18/21 1635 05/19/21 0503  AST <5*  --   --   ALT 9  --   --   ALKPHOS 62  --   --   BILITOT 0.9  --   --   PROT 7.3  --   --   ALBUMIN 3.3* 2.9* 2.7*   No results for input(s): LIPASE, AMYLASE in the last 168 hours. CBC: Recent Labs  Lab 05/18/21 1209 05/19/21 0503  WBC 3.2* 2.3*  NEUTROABS 2.1  --   HGB 8.0* 7.0*  HCT 25.3* 21.7*  MCV 103.3* 100.5*  PLT 126* 101*   Blood Culture    Component Value Date/Time   SDES BLOOD RIGHT HAND 04/13/2021 0600   SPECREQUEST  04/13/2021 0600    BOTTLES DRAWN AEROBIC AND ANAEROBIC Blood Culture adequate volume   CULT  04/13/2021 0600    NO GROWTH 5 DAYS Performed at [facility], [place] [address] [place], [place] [address]    REPTSTATUS 04/18/2021 FINAL 04/13/2021 0600    Cardiac Enzymes: No results for input(s): CKTOTAL, CKMB, CKMBINDEX, TROPONINI in the last 168 hours. CBG: Recent Labs  Lab 05/18/21 2028 05/18/21 2304 05/19/21 0557  GLUCAP 70  113* 108*   Iron Studies:  Recent Labs    05/18/21 1635  IRON 47  TIBC 248*  FERRITIN 304   @lablastinr3 @ Studies/Results: DG Chest Port 1 View  Result Date: 05/18/2021 CLINICAL DATA:  Dyspnea EXAM: PORTABLE CHEST 1 VIEW COMPARISON:  04/03/2021 chest radiograph. FINDINGS: Stable cardiomediastinal silhouette with moderate cardiomegaly. No pneumothorax. No pleural effusion. Borderline mild pulmonary edema. No acute consolidative airspace disease. IMPRESSION: Borderline mild congestive heart failure. Electronically Signed   By: Ilona Sorrel M.D.   On: 05/18/2021 11:57   Medications:  sodium chloride     sodium chloride     [START ON 05/20/2021] ferric gluconate (FERRLECIT) IVPB      amLODipine  2.5 mg Oral Daily   carvedilol  6.25 mg Oral BID WC   Chlorhexidine Gluconate Cloth  6 each Topical Q0600   [START ON 05/20/2021] cinacalcet  180 mg Oral Q T,Th,Sa-HD   darbepoetin (ARANESP) injection - DIALYSIS  200 mcg Intravenous Q Tue-HD   feeding supplement (NEPRO CARB STEADY)  237 mL Oral BID BM   folic acid  1 mg Oral Daily  heparin injection (subcutaneous)  5,000 Units Subcutaneous Q8H   multivitamin  1 tablet Oral QHS   nicotine  14 mg Transdermal Daily   rosuvastatin  10 mg Oral Daily   sertraline  25 mg Oral Daily   sevelamer carbonate  3,200 mg Oral TID WC   thiamine  100 mg Oral Daily    Dialysis Orders: Center: [facility] T,Th,S 4 hrs 180NRe 400/Autoflow 1.5 68.8 kg 2.0K/2.0 Ca UFP 4 AVF -No Heparin -Cinacalcet 180 mg PO TIW -Mircera 200 mcg IV q 2 weeks (last dose 04/29/2021) -Calcitriol 2.75 mcg PO TIW   Assessment/Plan: Lethargy:  Uremia D/T noncompliance with HD. SCr 27.2 BUN>200.  HD yesterday - short tx 2.5h, again today 3h and then resume usual orders tomorrow.  Hyperkalemia: In setting of missed HD. Lokelma 10 grams given in ED. Can use 2.0 K bath. Follow labs.  AMS: Secondary to uremia in setting of noncompliance with HD. Improving with HD.   ESRD -  T,Th,S Has not  been to treatment since 05/01/2021. HD x3, next 11/10.   Hypertension/volume  - Resume home meds.Replaced Metoprolol with coreg due to h/o cocaine abuse.  UF as tolerated.   Anemia  - HGB 7.0. No ESA since 04/29/21. Give Aranesp 200 mcg IV 11/8. Iron sat 19%  Load with Fe.   Metabolic bone disease -  C Ca 10.6. Hold VDRA. PO4 elevated. Resume senispar and binders (Renvela)  Nutrition - Albumin 3.3 Renal diet, nepro  Tobacco/polysubstance abuse-per primary Social issues: Long standing issues with polysubstance abuse, homelessness. Consulted MSW. Consider SNF placement if he is appropriate and agrees to go. Unable to establish goals of care until uremia clears and baseline mental status returns.   Jannifer Hick MD 05/19/2021, 8:42 AM  [facility] Pager: [phone]

## 2021-05-19 NOTE — Procedures (Signed)
I was present at this dialysis session, have reviewed the session itself and made  appropriate changes  Tolerating treatment and uremia improving   Jannifer Hick MD [place] pager [phone]   05/19/2021, 8:49 AM

## 2021-05-19 NOTE — Progress Notes (Signed)
HD#1 Subjective:  Overnight Events: none  Patient seen and assessed this morning at dialysis.  Patient states he is doing fine today.  Patient denies any abdominal pain today.  Patient mental status appears largely unchanged since yesterday.  Patient reports that he does not feel depressed at this time.  Patient denies present SI.  Discussed plan with the patient to continue to do dialysis to reduce uremic symptoms.  Patient verbalized agreement understanding.  Patient had no other questions at this time.   Objective:  Vital signs in last 24 hours: Vitals:   05/18/21 2032 05/19/21 0145 05/19/21 0500 05/19/21 0630  BP:  (!) 164/97  (!) 151/72  Pulse:  83  80  Resp:  18  18  Temp:  98.9 F (37.2 C)  98.6 F (37 C)  TempSrc:  Axillary  Oral  SpO2:  99%  99%  Weight: 69.5 kg  69.5 kg   Height:       Supplemental O2: Nasal Cannula SpO2: 99 % O2 Flow Rate (L/min): 2 L/min   Physical Exam:  Physical Exam Constitutional:      Appearance: Normal appearance.  HENT:     Head: Normocephalic.  Cardiovascular:     Rate and Rhythm: Normal rate and regular rhythm.     Pulses: Normal pulses.     Heart sounds: Normal heart sounds. No murmur heard.   No friction rub. No gallop.  Pulmonary:     Effort: Pulmonary effort is normal. No respiratory distress.     Breath sounds: Normal breath sounds. No wheezing or rales.  Abdominal:     General: Abdomen is flat. Bowel sounds are normal.     Palpations: Abdomen is soft.  Skin:    General: Skin is warm and dry.  Neurological:     Mental Status: He is alert.    Filed Weights   05/18/21 1208 05/18/21 2032 05/19/21 0500  Weight: 72.6 kg 69.5 kg 69.5 kg     Intake/Output Summary (Last 24 hours) at 05/19/2021 0800 Last data filed at 05/19/2021 0600 Gross per 24 hour  Intake 300 ml  Output 1500 ml  Net -1200 ml   Net IO Since Admission: -1,200 mL [05/19/21 0800]  Pertinent Labs: CBC Latest Ref Rng & Units 05/19/2021 05/18/2021  04/15/2021  WBC 4.0 - 10.5 K/uL 2.3(L) 3.2(L) 3.4(L)  Hemoglobin 13.0 - 17.0 g/dL 7.0(L) 8.0(L) 8.0(L)  Hematocrit 39.0 - 52.0 % 21.7(L) 25.3(L) 25.3(L)  Platelets 150 - 400 K/uL 101(L) 126(L) 173    CMP Latest Ref Rng & Units 05/19/2021 05/18/2021 05/18/2021  Glucose 70 - 99 mg/dL 79 106(H) 106(H)  BUN 8 - 23 mg/dL 119(H) 213(H) 213(H)  Creatinine 0.61 - 1.24 mg/dL 18.04(H) 28.57(H) 27.19(H)  Sodium 135 - 145 mmol/L 140 141 144  Potassium 3.5 - 5.1 mmol/L 5.0 6.1(H) 6.5(HH)  Chloride 98 - 111 mmol/L 100 101 100  CO2 22 - 32 mmol/L 23 18(L) 18(L)  Calcium 8.9 - 10.3 mg/dL 9.0 9.8 10.0  Total Protein 6.5 - 8.1 g/dL - - 7.3  Total Bilirubin 0.3 - 1.2 mg/dL - - 0.9  Alkaline Phos 38 - 126 U/L - - 62  AST 15 - 41 U/L - - <5(L)  ALT 0 - 44 U/L - - 9    Imaging: DG Chest Port 1 View  Result Date: 05/18/2021 CLINICAL DATA:  Dyspnea EXAM: PORTABLE CHEST 1 VIEW COMPARISON:  04/03/2021 chest radiograph. FINDINGS: Stable cardiomediastinal silhouette with moderate cardiomegaly. No pneumothorax. No pleural  effusion. Borderline mild pulmonary edema. No acute consolidative airspace disease. IMPRESSION: Borderline mild congestive heart failure. Electronically Signed   By: Ilona Sorrel M.D.   On: 05/18/2021 11:57    Assessment/Plan:   Active Problems:   ESRD (end stage renal disease) on dialysis [facility])   Patient Summary: Patient is a 65 year old male with history of ESRD on hemodialysis Tuesday, Thursday, Saturday (non-compliant), medication noncompliance, COPD, hep C treated, pancytopenia, hypertension, CAD, PAD, COPD presented to the ED on 05/18/2021 presented with altered mental status and admitted for uremia. AMS likely due to missing HD. Depression may be contributory to current presentation as well.   Active Problems: ESRD (end stage renal disease) on dialysis ([place])   Uremia due to ESRD and hemodialysis noncompliance Patient reports being noncompliant with hemodialysis since hospitalization  since 10/6. Patient reports this is due to worsening depression but may also be due to hypoxic brain injury (due to PEA arrest last hospitalization) Upon evaluation, patient appears to be more oriented today and less confused. -BUN 213 on admission > 119 -Cr 27.19 on admission > 18.04 -Nephrology already consulted. Greatly appreciate assistance. -Recommended serial HD to slowly lower BUN to avoid dialysis disequilibrium syndrome -Continue renal function panel monitoring -Aranesp injection 200 mcg every Tuesday during HD -Sensipar 180 mg during HD   Hyperkalemia -Potassium 6.5 on admission, no hemolysis noted -Potassium now 5.0 s/p HD -No ECG changes noted -Lokelma 10 g once -Continue to monitor potassium   Hypertension likely 2/2 medication noncompliance -Recent measured 151/72 -HD will likely help also with hypertension -Continue amlodipine 2.5 mg qd -Switch from metoprolol to carvedilol 12.5 mg bid for additional alpha blocking properties given cocaine use history   Hyperphosphatemia -Phosphorus 9.1 on admission, now 6.6 -Continue Renvela 3200 mg qd tid   COPD -99% on 2 L [place] -Duoneb 3 mL q6 hour PRN for wheezing/SOB   MDD Patient meets criteria for major depressive disorder. 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  During previous hospitalization, insulinoma and adrenal insufficiency had been ruled out.  More likely that hypoglycemia is related to ESRD and poor p.o. intake at this time. -Continue to monitor CBG   Alcohol Use Disorder Substance Use Disorder Nicotine Use Disorder -Negative alcohol -CIWA w/o ativan ordered -Nicoderm patch 14 mg for  nicotine replacement therapy   Dyslipidemia -Resumed home med Crestor 10 mg daily  Diet: Renal IVF: None,None VTE: Enoxaparin Code: Full PT/OT recs: Pending, none. TOC recs: pending   Dispo: Pending improved mental status and PT OT recommendations  France Ravens, MD 05/19/2021, 8:00 AM Pager: [phone]  Please contact the on call pager after 5 pm and on weekends at [phone].

## 2021-05-19 NOTE — Progress Notes (Signed)
New Admission Note:    Arrival Method: ED stretcher  Mental Orientation:   Alert to self Telemetry: 44M 21 Assessment: Completed IV:   Right AC Pain:   0/10 Tubes: N/A Safety Measures: Safety Fall Prevention Plan has been given, bed alarm on Admission: Completed [facility] [place] Orientation: Patient has been orientated to the room, unit and staff.  Family: None at bedside   Orders have been reviewed and implemented. Will continue to monitor the patient. Call light has been placed within reach and bed alarm has been activated.

## 2021-05-19 NOTE — Evaluation (Signed)
Physical Therapy Evaluation Patient Details Name: Raymond [name] MRN: [id number] DOB: 02-01-1956 Today's Date: 05/19/2021  History of Present Illness  65 y.o. male presents to [facility] hospital 05/18/2021 with AMS. Pt with 3 admissions since June 2022 for AMS and hypoglycemia, with PEA arrest during September admission. Pt reports worsening depression. PMH includes ESRD on HDU TTS, COPD, hep C, HTN, CAD, PAD, COPD.  Clinical Impression  Pt presents to PT with deficits in functional mobility, gait, balance, motor control, endurance, cognition. Pt with slowed processing and impaired awareness of deficits. Pt demonstrates generalized weakness, tremors, and imbalance, all placing the pt at a high risk for falls. Pt will benefit from acute PT services to improve mobility quality and reduce falls risk. PT recommends SNF placement at this time.       Recommendations for follow up therapy are one component of a multi-disciplinary discharge planning process, led by the attending physician.  Recommendations may be updated based on patient status, additional functional criteria and insurance authorization.  Follow Up Recommendations Skilled nursing-short term rehab (<3 hours/day)    Assistance Recommended at Discharge Intermittent Supervision/Assistance  Functional Status Assessment Patient has had a recent decline in their functional status and demonstrates the ability to make significant improvements in function in a reasonable and predictable amount of time.  Equipment Recommendations   (TBD)    Recommendations for Other Services       Precautions / Restrictions Precautions Precautions: Fall Restrictions Weight Bearing Restrictions: No      Mobility  Bed Mobility Overal bed mobility: Needs Assistance Bed Mobility: Rolling;Sidelying to Sit Rolling: Min guard Sidelying to sit: Min assist            Transfers Overall transfer level: Needs assistance Equipment used: 1 person hand held  assist Transfers: Sit to/from Stand;Bed to chair/wheelchair/BSC Sit to Stand: Mod assist Stand pivot transfers: Mod assist         General transfer comment: pt with impaired ability to initiate steps when transferring, PT facilitation of pivot. Pt with posterior lean when initially standing    Ambulation/Gait Ambulation/Gait assistance:  (deferred due to high falls risk)                Stairs            Wheelchair Mobility    Modified Rankin (Stroke Patients Only)       Balance Overall balance assessment: Needs assistance Sitting-balance support: Single extremity supported;Bilateral upper extremity supported;Feet supported Sitting balance-Leahy Scale: Poor Sitting balance - Comments: reliant on UE support   Standing balance support: Bilateral upper extremity supported Standing balance-Leahy Scale: Poor Standing balance comment: min-modA with UE support of PT                             Pertinent Vitals/Pain Pain Assessment: No/denies pain    Home Living Family/patient expects to be discharged to:: Private residence Living Arrangements:  (pt does not report)   Type of Home: Apartment Home Access: Level entry       Home Layout: One level Home Equipment: Cane - single point Additional Comments: pt responds to yes/no questions, is unable to provide details or answer other questions with multiple choices    Prior Function Prior Level of Function : Independent/Modified Independent             Mobility Comments: pt reports independence in mobility, no caregivers present to confirm       Hand  Dominance        Extremity/Trunk Assessment   Upper Extremity Assessment Upper Extremity Assessment: Generalized weakness (intention tremor noted in UEs with mobility)    Lower Extremity Assessment Lower Extremity Assessment: Generalized weakness    Cervical / Trunk Assessment Cervical / Trunk Assessment: Normal  Communication    Communication: Expressive difficulties;Receptive difficulties (pt responds to yes/no questions, does not consistently respond to questions with mutliple choices)  Cognition Arousal/Alertness: Lethargic (quickly falling asleep when not stimulated) Behavior During Therapy: Flat affect Overall Cognitive Status: No family/caregiver present to determine baseline cognitive functioning                                 General Comments: pt with slowed processing, reduced awareness of deficits. Pt requires multiple verbal cues to follow commands at this time and repeated cues.        General Comments General comments (skin integrity, edema, etc.): pt on 2L [place], PT weans to room air with stable sats    Exercises     Assessment/Plan    PT Assessment Patient needs continued PT services  PT Problem List Decreased strength;Decreased activity tolerance;Decreased balance;Decreased mobility;Decreased cognition;Decreased knowledge of use of DME;Decreased knowledge of precautions;Decreased safety awareness       PT Treatment Interventions DME instruction;Gait training;Functional mobility training;Therapeutic activities;Therapeutic exercise;Balance training;Neuromuscular re-education;Cognitive remediation;Patient/family education    PT Goals (Current goals can be found in the Care Plan section)  Acute Rehab PT Goals Patient Stated Goal: pt does not report goal, PT goal to improve transfer quality and return to gait training PT Goal Formulation: With patient Time For Goal Achievement: 06/02/21 Potential to Achieve Goals: Fair    Frequency Min 2X/week   Barriers to discharge        Co-evaluation               AM-PAC PT "6 Clicks" Mobility  Outcome Measure Help needed turning from your back to your side while in a flat bed without using bedrails?: A Little Help needed moving from lying on your back to sitting on the side of a flat bed without using bedrails?: A Little Help  needed moving to and from a bed to a chair (including a wheelchair)?: A Lot Help needed standing up from a chair using your arms (e.g., wheelchair or bedside chair)?: A Lot Help needed to walk in hospital room?: Total Help needed climbing 3-5 steps with a railing? : Total 6 Click Score: 12    End of Session   Activity Tolerance: Patient tolerated treatment well Patient left: in chair;with call bell/phone within reach;with chair alarm set Nurse Communication: Mobility status;Need for lift equipment (use of STEDY) PT Visit Diagnosis: Other abnormalities of gait and mobility (R26.89);Muscle weakness (generalized) (M62.81)    Time: 7824-2353 PT Time Calculation (min) (ACUTE ONLY): 20 min   Charges:   PT Evaluation $PT Eval Moderate Complexity: 1 Mod          Zenaida Niece, PT, DPT Acute Rehabilitation Pager: [phone] Office [phone]   Zenaida Niece 05/19/2021, 1:11 PM

## 2021-05-19 NOTE — Progress Notes (Signed)
OT Cancellation Note  Patient Details Name: Raymond [name] MRN: [id number] DOB: Jun 11, 1956   Cancelled Treatment:    Reason Eval/Treat Not Completed: Patient at procedure or test/ unavailable (Pt at HD, OT evaluation to f/u as appropriate.)  Cezar Misiaszek A Katrese Shell 05/19/2021, 9:45 AM

## 2021-05-20 LAB — RENAL FUNCTION PANEL
Albumin: 2.7 g/dL — ABNORMAL LOW (ref 3.5–5.0)
Anion gap: 11 (ref 5–15)
BUN: 54 mg/dL — ABNORMAL HIGH (ref 8–23)
CO2: 26 mmol/L (ref 22–32)
Calcium: 9.3 mg/dL (ref 8.9–10.3)
Chloride: 100 mmol/L (ref 98–111)
Creatinine, Ser: 10.7 mg/dL — ABNORMAL HIGH (ref 0.61–1.24)
GFR, Estimated: 5 mL/min — ABNORMAL LOW (ref >60)
Glucose, Bld: 87 mg/dL (ref 70–99)
Phosphorus: 5.7 mg/dL — ABNORMAL HIGH (ref 2.5–4.6)
Potassium: 4.5 mmol/L (ref 3.5–5.1)
Sodium: 137 mmol/L (ref 135–145)

## 2021-05-20 LAB — CBC
HCT: 24.7 % — ABNORMAL LOW (ref 39.0–52.0)
Hemoglobin: 7.5 g/dL — ABNORMAL LOW (ref 13.0–17.0)
MCH: 31.6 pg (ref 26.0–34.0)
MCHC: 30.4 g/dL (ref 30.0–36.0)
MCV: 104.2 fL — ABNORMAL HIGH (ref 80.0–100.0)
Platelets: 103 10*3/uL — ABNORMAL LOW (ref 150–400)
RBC: 2.37 MIL/uL — ABNORMAL LOW (ref 4.22–5.81)
RDW: 19.6 % — ABNORMAL HIGH (ref 11.5–15.5)
WBC: 3.4 10*3/uL — ABNORMAL LOW (ref 4.0–10.5)
nRBC: 0 % (ref 0.0–0.2)

## 2021-05-20 LAB — GLUCOSE, CAPILLARY: Glucose-Capillary: 82 mg/dL (ref 70–99)

## 2021-05-20 LAB — HEPATITIS B SURFACE ANTIBODY, QUANTITATIVE: Hep B S AB Quant (Post): >1000 m[IU]/mL (ref >9.9)

## 2021-05-20 MED ORDER — MELATONIN 3 MG PO TABS
3.0000 mg | ORAL_TABLET | Freq: Every evening | ORAL | Status: DC | PRN
  Administered 2021-05-20 – 2021-05-24 (×4): 3 mg via ORAL
  Filled 2021-05-20 (×4): qty 1

## 2021-05-20 MED ORDER — ONDANSETRON HCL 4 MG/2ML IJ SOLN
4.0000 mg | Freq: Three times a day (TID) | INTRAMUSCULAR | Status: DC | PRN
  Administered 2021-05-20 – 2021-05-23 (×3): 4 mg via INTRAVENOUS
  Filled 2021-05-20 (×3): qty 2

## 2021-05-20 MED ORDER — AMLODIPINE BESYLATE 10 MG PO TABS
10.0000 mg | ORAL_TABLET | Freq: Every day | ORAL | Status: DC
  Administered 2021-05-20 – 2021-05-25 (×6): 10 mg via ORAL
  Filled 2021-05-20 (×5): qty 1

## 2021-05-20 MED ORDER — SERTRALINE HCL 50 MG PO TABS
50.0000 mg | ORAL_TABLET | Freq: Every day | ORAL | Status: DC
  Administered 2021-05-21 – 2021-05-25 (×5): 50 mg via ORAL
  Filled 2021-05-20 (×5): qty 1

## 2021-05-20 NOTE — Progress Notes (Signed)
HD#2 Subjective:  Overnight Events: none  Patient seen and assessed this morning at dialysis.  Patient reports feeling more clearheaded today.  Patient feels that he is less depressed today.  Patient is alert and oriented x4.  Patient denies any abdominal pain, nausea, vomiting, headache.  Discussed plan with patient to continue dialysis to help improve patient's mentation.  Discussed that we consulted psychiatry in order to appropriately manage patient's psychiatric medication as well as weigh in on patient's disposition.  Objective:  Vital signs in last 24 hours: Vitals:   05/19/21 1227 05/19/21 1725 05/19/21 2106 05/20/21 0451  BP: (!) 167/97 (!) 184/81 (!) 173/93 (!) 178/92  Pulse: 90 77 80 74  Resp: 16 20 18 18   Temp: 98.8 F (37.1 C) 98.3 F (36.8 C) 98.3 F (36.8 C) 98.2 F (36.8 C)  TempSrc:      SpO2: 99% 95% 100% 100%  Weight:    66.9 kg  Height:       Supplemental O2: Nasal Cannula SpO2: 100 % O2 Flow Rate (L/min): 3 L/min   Physical Exam:  Physical Exam Constitutional:      Appearance: Normal appearance.  HENT:     Head: Normocephalic.  Cardiovascular:     Rate and Rhythm: Normal rate and regular rhythm.     Pulses: Normal pulses.     Heart sounds: Normal heart sounds. No murmur heard.   No friction rub. No gallop.  Pulmonary:     Effort: Pulmonary effort is normal. No respiratory distress.     Breath sounds: Normal breath sounds. No wheezing or rales.  Abdominal:     General: Abdomen is flat. Bowel sounds are normal.     Palpations: Abdomen is soft.  Skin:    General: Skin is warm and dry.  Neurological:     Mental Status: He is alert.    Filed Weights   05/19/21 0832 05/19/21 1147 05/20/21 0451  Weight: 70.4 kg 66.9 kg 66.9 kg     Intake/Output Summary (Last 24 hours) at 05/20/2021 0641 Last data filed at 05/20/2021 0200 Gross per 24 hour  Intake 580 ml  Output 2000 ml  Net -1420 ml    Net IO Since Admission: -2,620 mL [05/20/21  0641]  Pertinent Labs: CBC Latest Ref Rng & Units 05/19/2021 05/18/2021 04/15/2021  WBC 4.0 - 10.5 K/uL 2.3(L) 3.2(L) 3.4(L)  Hemoglobin 13.0 - 17.0 g/dL 7.0(L) 8.0(L) 8.0(L)  Hematocrit 39.0 - 52.0 % 21.7(L) 25.3(L) 25.3(L)  Platelets 150 - 400 K/uL 101(L) 126(L) 173    CMP Latest Ref Rng & Units 05/19/2021 05/18/2021 05/18/2021  Glucose 70 - 99 mg/dL 79 106(H) 106(H)  BUN 8 - 23 mg/dL 119(H) 213(H) 213(H)  Creatinine 0.61 - 1.24 mg/dL 18.04(H) 28.57(H) 27.19(H)  Sodium 135 - 145 mmol/L 140 141 144  Potassium 3.5 - 5.1 mmol/L 5.0 6.1(H) 6.5(HH)  Chloride 98 - 111 mmol/L 100 101 100  CO2 22 - 32 mmol/L 23 18(L) 18(L)  Calcium 8.9 - 10.3 mg/dL 9.0 9.8 10.0  Total Protein 6.5 - 8.1 g/dL - - 7.3  Total Bilirubin 0.3 - 1.2 mg/dL - - 0.9  Alkaline Phos 38 - 126 U/L - - 62  AST 15 - 41 U/L - - <5(L)  ALT 0 - 44 U/L - - 9    Imaging: No results found.  Assessment/Plan:   Active Problems:   ESRD (end stage renal disease) on dialysis ([place])   Uremia   Severe episode of recurrent major depressive  disorder, without psychotic features [facility])   Patient Summary: Patient is a 65 year old male with history of ESRD on hemodialysis Tuesday, Thursday, Saturday (non-compliant), medication noncompliance, COPD, hep C treated, pancytopenia, hypertension, CAD, PAD, COPD presented to the ED on 05/18/2021 presented with altered mental status and admitted for uremia. AMS likely due to missing HD. Depression may be contributory to current presentation as well.   Active Problems: ESRD (end stage renal disease) on dialysis ([place])   Uremia due to ESRD and hemodialysis noncompliance Patient reports being noncompliant with hemodialysis since hospitalization since 10/6. Patient reports this is due to worsening depression but may also be due to hypoxic brain injury (due to PEA arrest last hospitalization) Upon evaluation, patient appears to be more oriented today and less confused. -BUN 213 on admission > 54 -Cr  27.19 on admission > 10.70 -Nephrology consulted. Greatly appreciate assistance and recommendations. -Continuing serial HD to slowly lower BUN to avoid dialysis disequilibrium syndrome -Continue renal function panel monitoring -Aranesp injection 200 mcg every Tuesday during HD -Sensipar 180 mg during HD   Hyperkalemia -Potassium 6.5 on admission, no hemolysis noted -Potassium now 4.5 -No ECG changes noted on admission -Lokelma 10 g once in the ED -Continue to monitor potassium   Hypertension likely 2/2 medication noncompliance -Still elevated up to 178/92 this morning -HD will likely help also with hypertension -Increase amlodipine to 10 mg qd -Continue carvedilol 12.5 mg bid for additional alpha blocking properties given cocaine use history   Hyperphosphatemia -Phosphorus 9.1 on admission, now 5.7 -Continue Renvela 3200 mg qd tid   COPD -99% on 2 L [place] -Duoneb 3 mL q6 hour PRN for wheezing/SOB   MDD Patient meets criteria for major depressive disorder. -Zoloft 25 mg daily for depression -Consulted Psychiatry for medication management and better understanding of patient's mental health -Recommend outpatient behavioral health follow-up for medication management and possibly therapy   Macrocytic Anemia likely due to chronic alcohol use -Hgb 7.5 today -Iron 47 -TIBC 248 -Negative ethanol -B9/b12 normal last hospitalization 1 month ago   Hx of Hypoglycemia PEA arrest Patient has had multiple hospitalization due to hypoglycemia.  During previous hospitalization, insulinoma and adrenal insufficiency had been ruled out.  More likely that hypoglycemia is related to ESRD and poor p.o. intake at this time. -Continue to monitor CBG   Alcohol Use Disorder Substance Use Disorder Nicotine Use Disorder -Negative alcohol -CIWA score: 2, 0 -Nicoderm patch 14 mg for nicotine replacement therapy   Dyslipidemia -Resumed home med Crestor 10 mg daily  Diet: Renal IVF: None,None VTE:  Enoxaparin Code: Full PT/OT recs: Pending, none. TOC recs: pending   Dispo: Pending improved mental status and PT OT recommendations  France Ravens, MD 05/20/2021, 6:41 AM Pager: [phone]  Please contact the on call pager after 5 pm and on weekends at [phone].

## 2021-05-20 NOTE — Progress Notes (Signed)
[name] Progress Note   Subjective:Seen on HD. Much better today. Says he would accept SNF placement if this was an option. No C/Os.    Objective Vitals:   05/20/21 0451 05/20/21 0811 05/20/21 0828 05/20/21 0900  BP: (!) 178/92 (!) 169/91 (!) 152/80 (!) 155/88  Pulse: 74 78 76   Resp: 18 (!) 24 18 19   Temp: 98.2 F (36.8 C) 97.8 F (36.6 C)    TempSrc:  Temporal    SpO2: 100% 100%    Weight: 66.9 kg 67.6 kg    Height:       Physical Exam Physical Exam General: chronically ill appearing Neuro: Back to baseline-A & O x3 Heart: S1,S2 RRR, no M/R/G Lungs: CTAB Abdomen: soft nontender Extremities: no edema Dialysis Access:  L AVF +t/b, Qb 400   Dialysis Orders:  Additional Objective Labs: Basic Metabolic Panel: Recent Labs  Lab 05/18/21 1635 05/19/21 0503 05/20/21 0616  NA 141 140 137  K 6.1* 5.0 4.5  CL 101 100 100  CO2 18* 23 26  GLUCOSE 106* 79 87  BUN 213* 119* 54*  CREATININE 28.57* 18.04* 10.70*  CALCIUM 9.8 9.0 9.3  PHOS 9.0* 6.6* 5.7*   Liver Function Tests: Recent Labs  Lab 05/18/21 1209 05/18/21 1635 05/19/21 0503 05/20/21 0616  AST <5*  --   --   --   ALT 9  --   --   --   ALKPHOS 62  --   --   --   BILITOT 0.9  --   --   --   PROT 7.3  --   --   --   ALBUMIN 3.3* 2.9* 2.7* 2.7*   No results for input(s): LIPASE, AMYLASE in the last 168 hours. CBC: Recent Labs  Lab 05/18/21 1209 05/19/21 0503 05/20/21 0616  WBC 3.2* 2.3* 3.4*  NEUTROABS 2.1  --   --   HGB 8.0* 7.0* 7.5*  HCT 25.3* 21.7* 24.7*  MCV 103.3* 100.5* 104.2*  PLT 126* 101* 103*   Blood Culture    Component Value Date/Time   SDES BLOOD RIGHT HAND 04/13/2021 0600   SPECREQUEST  04/13/2021 0600    BOTTLES DRAWN AEROBIC AND ANAEROBIC Blood Culture adequate volume   CULT  04/13/2021 0600    NO GROWTH 5 DAYS Performed at [facility], [place] [address], [place], [place] [address]    REPTSTATUS 04/18/2021 FINAL 04/13/2021 0600    Cardiac  Enzymes: No results for input(s): CKTOTAL, CKMB, CKMBINDEX, TROPONINI in the last 168 hours. CBG: Recent Labs  Lab 05/18/21 2028 05/18/21 2304 05/19/21 0557 05/20/21 0633  GLUCAP 70 113* 108* 82   Iron Studies:  Recent Labs    05/18/21 1635  IRON 47  TIBC 248*  FERRITIN 304   @[email or web address] @ Studies/Results: DG Chest Port 1 View  Result Date: 05/18/2021 CLINICAL DATA:  Dyspnea EXAM: PORTABLE CHEST 1 VIEW COMPARISON:  04/03/2021 chest radiograph. FINDINGS: Stable cardiomediastinal silhouette with moderate cardiomegaly. No pneumothorax. No pleural effusion. Borderline mild pulmonary edema. No acute consolidative airspace disease. IMPRESSION: Borderline mild congestive heart failure. Electronically Signed   By: Ilona Sorrel M.D.   On: 05/18/2021 11:57   Medications:  sodium chloride     sodium chloride     ferric gluconate (FERRLECIT) IVPB      amLODipine  10 mg Oral Daily   carvedilol  6.25 mg Oral BID WC   Chlorhexidine Gluconate Cloth  6 each Topical Q0600   cinacalcet  180 mg  Oral Q T,Th,Sa-HD   darbepoetin (ARANESP) injection - DIALYSIS  200 mcg Intravenous Q Tue-HD   feeding supplement (NEPRO CARB STEADY)  237 mL Oral BID BM   folic acid  1 mg Oral Daily   heparin injection (subcutaneous)  5,000 Units Subcutaneous Q8H   multivitamin  1 tablet Oral QHS   nicotine  14 mg Transdermal Daily   rosuvastatin  10 mg Oral Daily   sertraline  25 mg Oral Daily   sevelamer carbonate  3,200 mg Oral TID WC   thiamine  100 mg Oral Daily     Dialysis Orders: Center: [facility] T,Th,S 4 hrs 180NRe 400/Autoflow 1.5 68.8 kg 2.0K/2.0 Ca UFP 4 AVF -No Heparin -Cinacalcet 180 mg PO TIW -Mircera 200 mcg IV q 2 weeks (last dose 04/29/2021) -Calcitriol 2.75 mcg PO TIW   Assessment/Plan: Lethargy:  Uremia D/T noncompliance with HD. SCr 27.2 BUN>200.  Resolved with serial HD. BUN 54 this AM Hyperkalemia: In setting of missed HD.Resolved with serial HD AMS: Secondary to uremia in setting of  noncompliance with HD. Resolved with HD.   ESRD -  T,Th,S Has not been to treatment since 05/01/2021. HD x3, next 11/10.   Hypertension/volume  - Resume home meds.Replaced Metoprolol with coreg due to h/o cocaine abuse.  UF as tolerated.   Anemia  - HGB 7.5 today. No ESA since 04/29/21. Given Aranesp 200 mcg IV 11/8. Iron sat 19%  Loading with Fe.   Metabolic bone disease -  C Ca 10.6. Hold VDRA. PO4 elevated. Resume senispar and binders (Renvela)  Nutrition - Albumin 3.3 Renal diet, nepro  Tobacco/polysubstance abuse-per primary Social issues: Long standing issues with polysubstance abuse, homelessness. Consulted MSW. Consider SNF placement if he is appropriate and agrees to go. Unable to establish goals of care until uremia clears and baseline mental status returns.     [name] H. Raymond Pereira NP-C 05/20/2021, 9:25 AM  [employer] [phone]

## 2021-05-20 NOTE — Plan of Care (Signed)
  Problem: Education: Goal: Knowledge of General Education information will improve Description Including pain rating scale, medication(s)/side effects and non-pharmacologic comfort measures Outcome: Progressing   Problem: Health Behavior/Discharge Planning: Goal: Ability to manage health-related needs will improve Outcome: Progressing   

## 2021-05-20 NOTE — Consult Note (Signed)
[facility] Consult   Reason for Consult:  Passive SI Referring Physician:  Gaylan Gerold Patient Identification: Raymond [name] MRN:  [id number] Principal Diagnosis: <principal problem not specified> Diagnosis:  Active Problems:   ESRD (end stage renal disease) on dialysis ([place])   Uremia   Severe episode of recurrent major depressive disorder, without psychotic features ([place])  Assessment  Raymond [name] is a 65 y.o. male admitted medically 05/18/2021 11:20 AM for AMS, Patient carries the psychiatric diagnoses of MDD and has a past medical history of   ESRD on hemodialysis Tuesday, Thursday, Saturday, COPD, hep C treated, pancytopenia, hypertension, CAD, PAD, COPD.Psychiatry was consulted for concern for passive SI.   He meets criteria for depression based on assessment and EMR.  Outpatient psychotropic medications include Zoloft and historically he has had a poor response to these medications. He was not compliant with medications prior to admission as evidenced by patient endorsing that he had stopped taking this medication months ago. On initial examination, patient appears to endorse symptoms of MDD and substance use disorder. We plan to continue to recommend psychotropic intervention and substance use outpatient .   On assessment today patient appears to have decreased symptoms of depression and endorses that he wishes to get help with his cocaine use. Patient appears to have attempted this multiple times and endorsed some concern that he has used all the resources available. In regards to his mood patient appears to be doing well on the Zoloft and displays some insight into recognizing that he needs HD and that he cares enough about himself currently to take his medications and get HD while in the hospital.   Plan MDD, recurrent, moderate - Increase Zoloft to 50mg   Cocaine use disorder - SW consult for resources at OP  Safety At this time patient appears to be of low risk of  harm to himself. This is based on assessment and information provided by other teams in the EMR. Recommend routine level of observation  Dispo: - Per primary Total Time spent with patient: 30 minutes  Subjective:   Raymond [name] is a 65 y.o. male patient admitted with AMS.  HPI:  On assessment today patient reports that he is not surprised that psychiatry has been asked to come see him. Patient is Aox4. Patient reports he came to the hospital because "I was confused." Patient reports that since he most recent discharge 10/6 he felt different. Patient reports that he felt "worthless" and that no one cared about him. Patient reports that he also began to feel that he should no longer care about himself. Patient reports that because of this feeling he stopped taking all of his medications and going to HD. Patient reports that he was not actively planning to kill himself by going to HD, but in hindsight he realizes that he nearly did kill himself. Patient reports that he also began using cocaine "because it gets me feeling high." Patient reports that without the cocaine he finds himself feeling unfulfilled and thinking about how the friends who use to participate in healthy recreational activities are deceased. Patient reports that over the past few weeks he has not been sleeping well, eating well, and his concentration has also been poor.   Patient reports that he was prescribed Zoloft in the past for depression but believes that his PCP discontinued this medication earlier this year because ti "messes with the blood pressure." Patient reports that he does believe that the medication was probably helpful for  him when he was taking it. Patient reports that on assessment today he is not having SI, HI, and AVH. Patient reports that his current goal is to try to stop using cocaine again. Patient reports that he has struggled with this multiple times and sometimes feels like giving up.   Past Psychiatric  History: Depression  Past Medical History:  Past Medical History:  Diagnosis Date   Anemia    CAD (coronary artery disease)    a. 03/2018: cath showing 52 to 30% stenosis along the LAD, luminal irregularities along the RCA, and angiographically normal LCx   COPD (chronic obstructive pulmonary disease) (HCC)    ESRD (end stage renal disease) on dialysis ([place])    "MWF; [facility]" (07/22/17)   Hepatitis C    Still positive s/p liver biopsy at [facility]  and interferon therapy for 6 months. Most recent lab work was on 10/24/12   Hepatitis C    "took the tx; gone now" (12/05/2016)  Was treated   History of blood transfusion ~ 2012/2013   "related to my kidneys; blood was low"   Hypertension    Peripheral arterial disease ([place])    Substance abuse ([place])    Thyroid disease    Type 2 diabetes mellitus with other diabetic kidney complication ([place]) 02/25/5630    Past Surgical History:  Procedure Laterality Date   ABDOMINAL AORTOGRAM W/LOWER EXTREMITY N/A 02/08/2018   Procedure: ABDOMINAL AORTOGRAM W/LOWER EXTREMITY;  Surgeon: Conrad Demopolis, MD;  Location: [facility];  Service: Cardiovascular;  Laterality: N/A;   ABDOMINAL AORTOGRAM W/LOWER EXTREMITY Bilateral 06/15/2020   Procedure: ABDOMINAL AORTOGRAM W/LOWER EXTREMITY;  Surgeon: Waynetta Sandy, MD;  Location: [facility];  Service: Cardiovascular;  Laterality: Bilateral;   AV FISTULA PLACEMENT Left Aug. 2013 ?   BIOPSY  01/17/2020   Procedure: BIOPSY;  Surgeon: Jackquline Denmark, MD;  Location: [facility];  Service: Endoscopy;;   COLONOSCOPY WITH PROPOFOL N/A 08/21/2018   Procedure: COLONOSCOPY WITH PROPOFOL;  Surgeon: Yetta Flock, MD;  Location: [facility];  Service: Gastroenterology;  Laterality: N/A;   ENTEROSCOPY N/A 01/17/2020   Procedure: ENTEROSCOPY;  Surgeon: Jackquline Denmark, MD;  Location: [facility] ENDOSCOPY;  Service: Endoscopy;  Laterality: N/A;   ENTEROSCOPY N/A 07/31/2020   Procedure: ENTEROSCOPY;  Surgeon:  Doran Stabler, MD;  Location: [place];  Service: Gastroenterology;  Laterality: N/A;   ENTEROSCOPY N/A 01/27/2021   Procedure: ENTEROSCOPY;  Surgeon: Yetta Flock, MD;  Location: [facility] ENDOSCOPY;  Service: Gastroenterology;  Laterality: N/A;   ESOPHAGOGASTRODUODENOSCOPY (EGD) WITH PROPOFOL N/A 08/20/2018   Procedure: ESOPHAGOGASTRODUODENOSCOPY (EGD) WITH PROPOFOL;  Surgeon: Gatha Mayer, MD;  Location: [facility];  Service: Endoscopy;  Laterality: N/A;   FEMORAL-POPLITEAL BYPASS GRAFT Left 04/11/2018   Procedure: BYPASS GRAFT FEMORAL-POPLITEAL ARTERY LEFT LEG;  Surgeon: Rosetta Posner, MD;  Location[facility] OR;  Service: Vascular;  Laterality: Left;   GIVENS CAPSULE STUDY N/A 07/31/2020   Procedure: GIVENS CAPSULE STUDY;  Surgeon: Doran Stabler, MD;  Location: [facility];  Service: Gastroenterology;  Laterality: N/A;   HOT HEMOSTASIS N/A 08/20/2018   Procedure: HOT HEMOSTASIS (ARGON PLASMA COAGULATION/BICAP);  Surgeon: Gatha Mayer, MD;  Location: [facility] ENDOSCOPY;  Service: Endoscopy;  Laterality: N/A;   HOT HEMOSTASIS N/A 01/17/2020   Procedure: HOT HEMOSTASIS (ARGON PLASMA COAGULATION/BICAP);  Surgeon: Jackquline [place], MD;  Location: [facility] ENDOSCOPY;  Service: Endoscopy;  Laterality: N/A;   HOT HEMOSTASIS N/A 01/27/2021   Procedure: HOT HEMOSTASIS (ARGON PLASMA COAGULATION/BICAP);  Surgeon: Yetta Flock, MD;  Location: [facility] ENDOSCOPY;  Service: Gastroenterology;  Laterality: N/A;   LEFT HEART CATH AND CORONARY ANGIOGRAPHY N/A 03/29/2018   Procedure: LEFT HEART CATH AND CORONARY ANGIOGRAPHY;  Surgeon: Nelva Bush, MD;  Location: [facility];  Service: Cardiovascular;  Laterality: N/A;   LIVER BIOPSY     ORIF ULNAR FRACTURE Left 05/18/2017   Procedure: OPEN REDUCTION INTERNAL FIXATION (ORIF) ULNAR FRACTURE;  Surgeon: Iran Planas, MD;  Location: [facility];  Service: Orthopedics;  Laterality: Left;   PERIPHERAL VASCULAR INTERVENTION Right 06/15/2020   Procedure: PERIPHERAL  VASCULAR INTERVENTION;  Surgeon: Waynetta Sandy, MD;  Location: [place] [facility];  Service: Cardiovascular;  Laterality: Right;   POLYPECTOMY  08/21/2018   Procedure: POLYPECTOMY;  Surgeon: Yetta Flock, MD;  Location: [facility];  Service: Gastroenterology;;   Earney Mallet N/A 12/11/2012   Procedure: Earney Mallet;  Surgeon: Serafina [facility], MD;  Location: [facility] CATH LAB;  Service: Cardiovascular;  Laterality: N/A;   WOUND EXPLORATION Left 06/15/2020   Procedure: GROIN  EXPLORATION;  Surgeon: Waynetta Sandy, MD;  Location: [facility];  Service: Vascular;  Laterality: Left;   Family History:  Family History  Problem Relation Age of Onset   Diabetes Father    Hypertension Father    Heart disease Father    Family Psychiatric  History: None known Social History:  Social History   Substance and Sexual Activity  Alcohol Use Yes   Alcohol/week: 10.0 standard drinks   Types: 10 Glasses of wine per week     Social History   Substance and Sexual Activity  Drug Use Not Currently   Types: Cocaine    Social History   Socioeconomic History   Marital status: Single    Spouse name: Not on file   Number of children: 2   Years of education: Not on file   Highest education level: 11th grade  Occupational History   Not on file  Tobacco Use   Smoking status: Every Day    Packs/day: 0.20    Years: 25.00    Pack years: 5.00    Types: Cigarettes    Last attempt to quit: 08/01/2011    Years since quitting: 9.8   Smokeless tobacco: Never   Tobacco comments:    he smokes 6 cigarettes a week  Vaping Use   Vaping Use: Never used  Substance and Sexual Activity   Alcohol use: Yes    Alcohol/week: 10.0 standard drinks    Types: 10 Glasses of wine per week   Drug use: Not Currently    Types: Cocaine   Sexual activity: Not on file  Other Topics Concern   Not on file  Social History Narrative   Patient is living in a apartment alone. Professional cement finisher. Has 2  children (86yo & 79yo sons). Plans to make HCPOA for sister Neoma Laming to make decisions for him should he need them.    Social Determinants of Health   Financial Resource Strain: Not on file  Food Insecurity: Not on file  Transportation Needs: Not on file  Physical Activity: Not on file  Stress: Not on file  Social Connections: Not on file   Additional Social History:    Allergies:  No Known Allergies  Labs:  Results for orders placed or performed during the hospital encounter of 05/18/21 (from the past 48 hour(s))  Ethanol     Status: None   Collection Time: 05/18/21  3:52 PM  Result Value Ref Range  Alcohol, Ethyl (B) <10 <10 mg/dL    Comment: (NOTE) Lowest detectable limit for serum alcohol is 10 mg/dL.  For medical purposes only. Performed at [facility], [place] [address], [place], [place] [address]   Renal function panel     Status: Abnormal   Collection Time: 05/18/21  4:35 PM  Result Value Ref Range   Sodium 141 135 - 145 mmol/L   Potassium 6.1 (H) 3.5 - 5.1 mmol/L   Chloride 101 98 - 111 mmol/L   CO2 18 (L) 22 - 32 mmol/L   Glucose, Bld 106 (H) 70 - 99 mg/dL    Comment: Glucose reference range applies only to samples taken after fasting for at least 8 hours.   BUN 213 (H) 8 - 23 mg/dL   Creatinine, Ser 28.57 (H) 0.61 - 1.24 mg/dL    Comment: RESULTS CONFIRMED BY MANUAL DILUTION   Calcium 9.8 8.9 - 10.3 mg/dL   Phosphorus 9.0 (H) 2.5 - 4.6 mg/dL   Albumin 2.9 (L) 3.5 - 5.0 g/dL   GFR, Estimated 1 (L) >60 mL/min    Comment: (NOTE) Calculated using the CKD-EPI Creatinine Equation (2021)    Anion gap 22 (H) 5 - 15    Comment: REPEATED TO VERIFY Performed at [facility] [address], [place], [place] [address]   Ferritin     Status: None   Collection Time: 05/18/21  4:35 PM  Result Value Ref Range   Ferritin 304 24 - 336 ng/mL    Comment: Performed at [facility] [address] [place], [place] [address]  Iron and TIBC     Status:  Abnormal   Collection Time: 05/18/21  4:35 PM  Result Value Ref Range   Iron 47 45 - 182 ug/dL   TIBC 248 (L) 250 - 450 ug/dL   Saturation Ratios 19 17.9 - 39.5 %   UIBC 201 ug/dL    Comment: Performed at [facility] [address] [place], [place] [address]  Glucose, capillary     Status: None   Collection Time: 05/18/21  8:28 PM  Result Value Ref Range   Glucose-Capillary 70 70 - 99 mg/dL    Comment: Glucose reference range applies only to samples taken after fasting for at least 8 hours.  Glucose, capillary     Status: Abnormal   Collection Time: 05/18/21 11:04 PM  Result Value Ref Range   Glucose-Capillary 113 (H) 70 - 99 mg/dL    Comment: Glucose reference range applies only to samples taken after fasting for at least 8 hours.  MRSA Next Gen by PCR, Nasal     Status: None   Collection Time: 05/19/21  3:32 AM   Specimen: Nasal Mucosa; Nasal Swab  Result Value Ref Range   MRSA by PCR Next Gen NOT DETECTED NOT DETECTED    Comment: (NOTE) The GeneXpert MRSA Assay (FDA approved for NASAL specimens only), is one component of a comprehensive MRSA colonization surveillance program. It is not intended to diagnose MRSA infection nor to guide or monitor treatment for MRSA infections. Test performance is not FDA approved in patients less than 71 years old. Performed at [facility], [place] [address], [place] [address]   CBC     Status: Abnormal   Collection Time: 05/19/21  5:03 AM  Result Value Ref Range   WBC 2.3 (L) 4.0 - 10.5 K/uL   RBC 2.16 (L) 4.22 - 5.81 MIL/uL  Hemoglobin 7.0 (L) 13.0 - 17.0 g/dL   HCT 21.7 (L) 39.0 - 52.0 %   MCV 100.5 (H) 80.0 - 100.0 fL   MCH 32.4 26.0 - 34.0 pg   MCHC 32.3 30.0 - 36.0 g/dL   RDW 19.4 (H) 11.5 - 15.5 %   Platelets 101 (L) 150 - 400 K/uL    Comment: Immature Platelet Fraction may be clinically indicated, consider ordering this additional test [id number] REPEATED TO VERIFY PLATELET COUNT CONFIRMED BY  SMEAR PLATELET CLUMPS NOTED ON SMEAR, COUNT APPEARS ADEQUATE    nRBC 0.0 0.0 - 0.2 %    Comment: Performed at [facility], [place] [address], [place], [place] [address]  Renal function panel     Status: Abnormal   Collection Time: 05/19/21  5:03 AM  Result Value Ref Range   Sodium 140 135 - 145 mmol/L   Potassium 5.0 3.5 - 5.1 mmol/L   Chloride 100 98 - 111 mmol/L   CO2 23 22 - 32 mmol/L   Glucose, Bld 79 70 - 99 mg/dL    Comment: Glucose reference range applies only to samples taken after fasting for at least 8 hours.   BUN 119 (H) 8 - 23 mg/dL   Creatinine, Ser 18.04 (H) 0.61 - 1.24 mg/dL    Comment: DELTA CHECK NOTED   Calcium 9.0 8.9 - 10.3 mg/dL   Phosphorus 6.6 (H) 2.5 - 4.6 mg/dL   Albumin 2.7 (L) 3.5 - 5.0 g/dL   GFR, Estimated 3 (L) >60 mL/min    Comment: (NOTE) Calculated using the CKD-EPI Creatinine Equation (2021)    Anion gap 17 (H) 5 - 15    Comment: Performed at [facility] [address], [place], [place] [address]  Hepatitis B surface antigen     Status: None   Collection Time: 05/19/21  5:03 AM  Result Value Ref Range   Hepatitis B Surface Ag NON REACTIVE NON REACTIVE    Comment: Performed at [facility] [address] [place], [place] [address]  Hepatitis B surface antibody     Status: Abnormal   Collection Time: 05/19/21  5:03 AM  Result Value Ref Range   Hep B S Ab Reactive (A) NON REACTIVE    Comment: (NOTE) Consistent with immunity, greater than 9.9 mIU/mL.  Performed at [facility], [place] [address], [place], [place] [address]   Hepatitis B surface antibody,quantitative     Status: None   Collection Time: 05/19/21  5:03 AM  Result Value Ref Range   Hepatitis B-Post >1,000.0 Immunity>9.9 mIU/mL    Comment: (NOTE)  Status of Immunity                     Anti-HBs Level  ------------------                     -------------- Inconsistent with Immunity                   0.0 - 9.9 Consistent with Immunity                           >9.9 Performed At: [facility] [address] [place], [place] [id number] Rush Farmer MD BJ:[id number]   Glucose, capillary     Status: Abnormal   Collection Time: 05/19/21  5:57 AM  Result Value Ref Range   Glucose-Capillary 108 (H) 70 - 99 mg/dL  Comment: Glucose reference range applies only to samples taken after fasting for at least 8 hours.  Renal function panel     Status: Abnormal   Collection Time: 05/20/21  6:16 AM  Result Value Ref Range   Sodium 137 135 - 145 mmol/L   Potassium 4.5 3.5 - 5.1 mmol/L   Chloride 100 98 - 111 mmol/L   CO2 26 22 - 32 mmol/L   Glucose, Bld 87 70 - 99 mg/dL    Comment: Glucose reference range applies only to samples taken after fasting for at least 8 hours.   BUN 54 (H) 8 - 23 mg/dL   Creatinine, Ser 10.70 (H) 0.61 - 1.24 mg/dL    Comment: DELTA CHECK NOTED   Calcium 9.3 8.9 - 10.3 mg/dL   Phosphorus 5.7 (H) 2.5 - 4.6 mg/dL   Albumin 2.7 (L) 3.5 - 5.0 g/dL   GFR, Estimated 5 (L) >60 mL/min    Comment: (NOTE) Calculated using the CKD-EPI Creatinine Equation (2021)    Anion gap 11 5 - 15    Comment: Performed at [facility] [address], [place], [place] [address]  CBC     Status: Abnormal   Collection Time: 05/20/21  6:16 AM  Result Value Ref Range   WBC 3.4 (L) 4.0 - 10.5 K/uL   RBC 2.37 (L) 4.22 - 5.81 MIL/uL   Hemoglobin 7.5 (L) 13.0 - 17.0 g/dL   HCT 24.7 (L) 39.0 - 52.0 %   MCV 104.2 (H) 80.0 - 100.0 fL   MCH 31.6 26.0 - 34.0 pg   MCHC 30.4 30.0 - 36.0 g/dL   RDW 19.6 (H) 11.5 - 15.5 %   Platelets 103 (L) 150 - 400 K/uL    Comment: Immature Platelet Fraction may be clinically indicated, consider ordering this additional test [id number] CONSISTENT WITH PREVIOUS RESULT REPEATED TO VERIFY    nRBC 0.0 0.0 - 0.2 %    Comment: Performed at [facility], [place] [address], [place], [place] [address]  Glucose, capillary     Status: None   Collection Time: 05/20/21  6:33 AM  Result Value Ref Range    Glucose-Capillary 82 70 - 99 mg/dL    Comment: Glucose reference range applies only to samples taken after fasting for at least 8 hours.    Current Facility-Administered Medications  Medication Dose Route Frequency Provider Last Rate Last Admin   0.9 %  sodium chloride infusion  100 mL Intravenous PRN Gaylan Gerold, DO       0.9 %  sodium chloride infusion  100 mL Intravenous PRN Gaylan Gerold, DO       acetaminophen (TYLENOL) tablet 650 mg  650 mg Oral Q6H PRN Gaylan Gerold, DO   650 mg at 05/20/21 1206   Or   acetaminophen (TYLENOL) suppository 650 mg  650 mg Rectal Q6H PRN Gaylan Gerold, DO       amLODipine (NORVASC) tablet 10 mg  10 mg Oral Daily France Ravens, MD   10 mg at 05/20/21 1349   carvedilol (COREG) tablet 6.25 mg  6.25 mg Oral BID WC Valentina [place], NP       Chlorhexidine Gluconate Cloth 2 % PADS 6 each  6 each Topical Q0600 Gaylan Gerold, DO       cinacalcet [facility]) tablet 180 mg  180 mg Oral Q T,Th,Sa-HD Gaylan Gerold, DO   180 mg at 05/20/21 1001   Darbepoetin Alfa (ARANESP) injection 200 mcg  200 mcg Intravenous Q  Tue-HD Gaylan Gerold, DO   200 mcg at 05/18/21 1859   feeding supplement (NEPRO CARB STEADY) liquid 237 mL  237 mL Oral BID BM Gaylan Gerold, DO 0 mL/hr at 05/19/21 0600 237 mL at 05/20/21 1344   ferric gluconate (FERRLECIT) 250 mg in sodium chloride 0.9 % 250 mL IVPB  250 mg Intravenous Q T,Th,Sa-HD Valentina [place], NP   Stopped at 93/23/55 7322   folic acid (FOLVITE) tablet 1 mg  1 mg Oral Daily Gaylan Gerold, DO   1 mg at 05/20/21 1349   heparin injection 5,000 Units  5,000 Units Subcutaneous Q8H Gaylan Gerold, DO   5,000 Units at 05/20/21 1350   ipratropium-albuterol (DUONEB) 0.5-2.5 (3) MG/3ML nebulizer solution 3 mL  3 mL Nebulization Q6H PRN Gaylan Gerold, DO       lidocaine (PF) (XYLOCAINE) 1 % injection 5 mL  5 mL Intradermal PRN Gaylan Gerold, DO       lidocaine-prilocaine (EMLA) cream 1 application  1 application Topical PRN Gaylan Gerold, DO        multivitamin (RENA-VIT) tablet 1 tablet  1 tablet Oral QHS Gaylan Gerold, DO   1 tablet at 05/19/21 2235   nicotine (NICODERM CQ - dosed in mg/24 hours) patch 14 mg  14 mg Transdermal Daily France Ravens, MD   14 mg at 05/20/21 1353   pentafluoroprop-tetrafluoroeth (GEBAUERS) aerosol 1 application  1 application Topical PRN Gaylan Gerold, DO       polyethylene glycol (MIRALAX / GLYCOLAX) packet 17 g  17 g Oral Daily PRN Gaylan Gerold, DO       rosuvastatin (CRESTOR) tablet 10 mg  10 mg Oral Daily Gaylan Gerold, DO   10 mg at 05/20/21 1349   sertraline (ZOLOFT) tablet 25 mg  25 mg Oral Daily Gaylan Gerold, DO   25 mg at 05/20/21 1349   sevelamer carbonate (RENVELA) tablet 3,200 mg  3,200 mg Oral TID WC Gaylan Gerold, DO   3,200 mg at 05/19/21 1314   thiamine tablet 100 mg  100 mg Oral Daily Gaylan Gerold, DO   100 mg at 05/20/21 1349   Facility-Administered Medications Ordered in Other Encounters  Medication Dose Route Frequency Provider Last Rate Last Admin   midazolam (VERSED) 5 MG/5ML injection    Anesthesia Intra-op Sammie Bench, CRNA   2 mg at 04/11/18 1042     Psychiatric Specialty Exam:  Presentation  General Appearance: Appropriate for Environment  Eye Contact:Good  Speech:Slurred  Speech Volume:Decreased  Handedness:No data recorded  Mood and Affect  Mood:Dysphoric  Affect:Flat   Thought Process  Thought Processes:Goal Directed  Descriptions of Associations:Circumstantial  Orientation:Full (Time, Place and Person)  Thought Content:Logical  History of Schizophrenia/Schizoaffective disorder:No data recorded Duration of Psychotic Symptoms:No data recorded Hallucinations:Hallucinations: None  Ideas of Reference:None  Suicidal Thoughts:Suicidal Thoughts: No  Homicidal Thoughts:Homicidal Thoughts: No   Sensorium  Memory:Immediate Fair; Recent Fair; Remote Fair  Judgment:-- (Improving)  Insight:Shallow   Executive Functions   Concentration:Fair  Attention Span:Fair  Recall:No data recorded Fund of Knowledge:Fair  Language:Fair   Psychomotor Activity  Psychomotor Activity:Psychomotor Activity: Normal   Assets  Assets:Resilience   Sleep  Sleep:Sleep: Poor   Physical Exam: Physical Exam HENT:     Head: Normocephalic and atraumatic.  Eyes:     Extraocular Movements: Extraocular movements intact.  Pulmonary:     Effort: Pulmonary effort is normal.  Skin:    General: Skin is dry.  Neurological:     Mental Status: He is alert.  Review of Systems  Psychiatric/Behavioral:  Negative for hallucinations and suicidal ideas.   Blood pressure (!) 176/103, pulse 76, temperature 97.8 F (36.6 C), temperature source Temporal, resp. rate 18, height 5\' 8"  (1.727 m), weight 67.6 kg, SpO2 100 %. Body mass index is 22.66 kg/m.  PGY-2 Freida Busman, MD 05/20/2021 3:40 PM

## 2021-05-20 NOTE — Care Management Important Message (Signed)
Important Message  Patient Details  Name: Raymond [name] MRN: [id number] Date of Birth: 1956-05-03   Medicare Important Message Given:  Yes     Dacy Enrico [address] 05/20/2021, 2:39 PM

## 2021-05-20 NOTE — Progress Notes (Addendum)
Pt receives out-pt HD at [facility] on [facility]. Contacted clinic regarding pt's appointment time. Will follow and assist as needed.  Melven Sartorius Renal Navigator [phone]  Addendum at 4:15pm: Pt arrives at out-pt HD clinic at 11:45 for 12:05 chair time.

## 2021-05-21 LAB — CBC
HCT: 28.1 % — ABNORMAL LOW (ref 39.0–52.0)
Hemoglobin: 8.7 g/dL — ABNORMAL LOW (ref 13.0–17.0)
MCH: 32.8 pg (ref 26.0–34.0)
MCHC: 31 g/dL (ref 30.0–36.0)
MCV: 106 fL — ABNORMAL HIGH (ref 80.0–100.0)
Platelets: 108 10*3/uL — ABNORMAL LOW (ref 150–400)
RBC: 2.65 MIL/uL — ABNORMAL LOW (ref 4.22–5.81)
RDW: 19.4 % — ABNORMAL HIGH (ref 11.5–15.5)
WBC: 3.4 10*3/uL — ABNORMAL LOW (ref 4.0–10.5)
nRBC: 0 % (ref 0.0–0.2)

## 2021-05-21 LAB — RENAL FUNCTION PANEL
Albumin: 2.8 g/dL — ABNORMAL LOW (ref 3.5–5.0)
Anion gap: 8 (ref 5–15)
BUN: 18 mg/dL (ref 8–23)
CO2: 31 mmol/L (ref 22–32)
Calcium: 8.6 mg/dL — ABNORMAL LOW (ref 8.9–10.3)
Chloride: 97 mmol/L — ABNORMAL LOW (ref 98–111)
Creatinine, Ser: 6.05 mg/dL — ABNORMAL HIGH (ref 0.61–1.24)
GFR, Estimated: 10 mL/min — ABNORMAL LOW (ref >60)
Glucose, Bld: 96 mg/dL (ref 70–99)
Phosphorus: 4.9 mg/dL — ABNORMAL HIGH (ref 2.5–4.6)
Potassium: 3.8 mmol/L (ref 3.5–5.1)
Sodium: 136 mmol/L (ref 135–145)

## 2021-05-21 LAB — GLUCOSE, CAPILLARY: Glucose-Capillary: 78 mg/dL (ref 70–99)

## 2021-05-21 MED ORDER — CHLORHEXIDINE GLUCONATE CLOTH 2 % EX PADS
6.0000 | MEDICATED_PAD | Freq: Every day | CUTANEOUS | Status: DC
  Administered 2021-05-21 – 2021-05-23 (×3): 6 via TOPICAL

## 2021-05-21 NOTE — Progress Notes (Addendum)
HD#3 Subjective:  Overnight Events: none  Patient seen and assessed this morning at bedside.  Patient appeared to be resting comfortably.  Patient denies any abdominal today.  Patient appears more oriented and alert today.  Patient states that he feels better and is appreciative of the care he has received here at the hospital.    Discussed plan with patient to continue dialysis and work on SNF placement.  Patient verbalized agreement and had no other questions at this time.    Objective:  Vital signs in last 24 hours: Vitals:   05/20/21 1158 05/20/21 1950 05/20/21 2005 05/21/21 0553  BP: (!) 176/103  (!) 149/85 (!) 157/85  Pulse:   65 71  Resp: 18  16 18   Temp:   97.8 F (36.6 C) 97.7 F (36.5 C)  TempSrc:   Oral Oral  SpO2:    98%  Weight:  66 kg    Height:       Supplemental O2: Nasal Cannula SpO2: 98 % O2 Flow Rate (L/min): 2 L/min   Physical Exam:  Physical Exam Constitutional:      Appearance: Normal appearance.  HENT:     Head: Normocephalic.  Cardiovascular:     Rate and Rhythm: Normal rate and regular rhythm.     Pulses: Normal pulses.     Heart sounds: Normal heart sounds. No murmur heard.   No friction rub. No gallop.  Pulmonary:     Effort: Pulmonary effort is normal. No respiratory distress.     Breath sounds: Normal breath sounds. No wheezing or rales.  Abdominal:     General: Abdomen is flat. Bowel sounds are normal.     Palpations: Abdomen is soft.  Skin:    General: Skin is warm and dry.  Neurological:     Mental Status: He is alert.    Filed Weights   05/20/21 0451 05/20/21 0811 05/20/21 1950  Weight: 66.9 kg 67.6 kg 66 kg     Intake/Output Summary (Last 24 hours) at 05/21/2021 6269 Last data filed at 05/20/2021 1500 Gross per 24 hour  Intake 270 ml  Output 2000 ml  Net -1730 ml    Net IO Since Admission: -4,350 mL [05/21/21 0608]  Pertinent Labs: CBC Latest Ref Rng & Units 05/20/2021 05/19/2021 05/18/2021  WBC 4.0 - 10.5  K/uL 3.4(L) 2.3(L) 3.2(L)  Hemoglobin 13.0 - 17.0 g/dL 7.5(L) 7.0(L) 8.0(L)  Hematocrit 39.0 - 52.0 % 24.7(L) 21.7(L) 25.3(L)  Platelets 150 - 400 K/uL 103(L) 101(L) 126(L)    CMP Latest Ref Rng & Units 05/20/2021 05/19/2021 05/18/2021  Glucose 70 - 99 mg/dL 87 79 106(H)  BUN 8 - 23 mg/dL 54(H) 119(H) 213(H)  Creatinine 0.61 - 1.24 mg/dL 10.70(H) 18.04(H) 28.57(H)  Sodium 135 - 145 mmol/L 137 140 141  Potassium 3.5 - 5.1 mmol/L 4.5 5.0 6.1(H)  Chloride 98 - 111 mmol/L 100 100 101  CO2 22 - 32 mmol/L 26 23 18(L)  Calcium 8.9 - 10.3 mg/dL 9.3 9.0 9.8  Total Protein 6.5 - 8.1 g/dL - - -  Total Bilirubin 0.3 - 1.2 mg/dL - - -  Alkaline Phos 38 - 126 U/L - - -  AST 15 - 41 U/L - - -  ALT 0 - 44 U/L - - -    Imaging: No results found.  Assessment/Plan:   Active Problems:   ESRD (end stage renal disease) on dialysis ([place])   Uremia   Severe episode of recurrent major depressive disorder, without psychotic features (  [facility])   Patient Summary: Patient is a 65 year old male with history of ESRD on hemodialysis Tuesday, Thursday, Saturday (non-compliant), medication noncompliance, COPD, hep C treated, pancytopenia, hypertension, CAD, PAD, COPD presented to the ED on 05/18/2021 presented with altered mental status and admitted for uremia. AMS likely due to missing HD. Depression may be contributory to current presentation as well.   Active Problems: ESRD (end stage renal disease) on dialysis ([place])   Acute Metabolic Encephalopathy 2/2 Uremia - Resolved ESRD on HD Patient reports being noncompliant with hemodialysis since hospitalization since 10/6. Patient reports this is due to worsening depression but may also be due to hypoxic brain injury (due to PEA arrest last hospitalization) Upon evaluation, patient appears to be more oriented today and less confused. -BUN 213 on admission > 18 -Cr 27.19 on admission > 6.05 -Nephrology consulted. Greatly appreciate assistance and  recommendations. -Completed serial HD  -Next scheduled HD 11/12 -Continue renal function panel monitoring -Aranesp injection 200 mcg every Tuesday during HD -Sensipar 180 mg during HD   Hyperkalemia, resolved -Potassium 6.5 on admission, no hemolysis noted -Potassium now 3.8 -No ECG changes noted on admission -Lokelma 10 g once in the ED -Continue to monitor potassium   Hypertension likely 2/2 medication noncompliance -Most recent 150/76.  Appears to be improving. -HD will likely help also with hypertension -Increase amlodipine to 10 mg qd -Continue carvedilol 12.5 mg bid for additional alpha blocking properties given cocaine use history -We will continue to monitor blood pressures   Hyperphosphatemia -Phosphorus 9.1 on admission, now 4.9 -Continue Renvela 3200 mg qd tid   COPD -99% on 2 L [place] -Duoneb 3 mL q6 hour PRN for wheezing/SOB   MDD Patient meets criteria for major depressive disorder. -Psychiatry recommended increase in Zoloft to 50 mg daily for depression -Consulted Psychiatry for medication management and better understanding of patient's mental health.  Greatly appreciate recommendations -Recommend outpatient behavioral health follow-up for medication management and possibly therapy   Macrocytic Anemia likely due to chronic alcohol use -Hgb 8.7 today -Iron 47 -TIBC 248 -Negative ethanol -B9/b12 normal last hospitalization 1 month ago   Hx of Hypoglycemia PEA arrest Patient has had multiple hospitalization due to hypoglycemia.  During previous hospitalization, insulinoma and adrenal insufficiency had been ruled out.  More likely that hypoglycemia is related to ESRD and poor p.o. intake at this time. -Continue to monitor CBG   Alcohol Use Disorder Substance Use Disorder Nicotine Use Disorder -Negative alcohol -CIWA score: 2, 0 -Nicoderm patch 14 mg for nicotine replacement therapy   Dyslipidemia -Resumed home med Crestor 10 mg daily  Diet: Renal IVF:  None,None VTE: Enoxaparin Code: Full PT/OT recs: Pending, none. TOC recs: pending   Dispo: Pending improved mental status and PT OT recommendations  France Ravens, MD 05/21/2021, 6:09 AM Pager: [phone]  Please contact the on call pager after 5 pm and on weekends at [phone].

## 2021-05-21 NOTE — Progress Notes (Signed)
Initial Nutrition Assessment  DOCUMENTATION CODES:   Not applicable  INTERVENTION:  -Nepro Shake po BID, each supplement provides 425 kcal and 19 grams protein -renavit daily  NUTRITION DIAGNOSIS:   Increased nutrient needs related to chronic illness (ESRD on HD) as evidenced by estimated needs.  GOAL:   Patient will meet greater than or equal to 90% of their needs  MONITOR:   PO intake, Supplement acceptance, Weight trends, Labs, I & O's  REASON FOR ASSESSMENT:   Malnutrition Screening Tool    ASSESSMENT:   Pt with PMH significant for ESRD on HD, COPD, Hep C (treated), pancytopenia, HTN, CAD, PAD, and h/o PEA arrest requiring intubation was admitted with ESRD uremia.  Pt had not been to HD treatment since 05/01/21. Pt has since had HDx3 and is now back on schedule with next HD planned for tomorrow, 11/12, per Nephrology. Pt wishing to go to SNF if eligible.   Pt reports worsening depressed mood, poor sleep, poor appetite, fatigue, and poor concentration. Pt unable to quantify duration of poor appetite, but states that he has felt depressed for the last year. Pt states that he was struggling with abdominal pain and mild nausea PTA. Abdominal pain has since resolved and appetite is somewhat improving, but pt continues to endorse some nausea.   PO Intake: 0-100% x 5 recorded meals (72% avg meal intake)   Weight history reviewed. Pt weighed 71.7 kg on 04/15/21 and 66 kg yesterday 05/20/21, indicating potential 7.9% weight loss x1 month (clinically significant weight loss for time frame). Likely that edema/fluid status have impacted pt's weight history, so it is difficult to determine true percentage of weight loss. That being said, true weight loss can safely be assumed given downtrends in weights and pt being below EDW now that he is back on HD schedule. Suspect pt meets criteria for malnutrition, but unable to diagnose without NFPE. Will attempt at follow-up.   Pt is below  EDW EDW: 68.8 kg Last HD 11/10 w/ 2L net UF Current wt: 66 kg  No UOP documented x24 hours I/O: -4226ml since admit  Edema: non-pitting edema to BLE per RN assessment  Medications: Scheduled Meds:  amLODipine  10 mg Oral Daily   carvedilol  6.25 mg Oral BID WC   Chlorhexidine Gluconate Cloth  6 each Topical Q0600   Chlorhexidine Gluconate Cloth  6 each Topical Q0600   cinacalcet  180 mg Oral Q T,Th,Sa-HD   darbepoetin (ARANESP) injection - DIALYSIS  200 mcg Intravenous Q Tue-HD   feeding supplement (NEPRO CARB STEADY)  237 mL Oral BID BM   folic acid  1 mg Oral Daily   heparin injection (subcutaneous)  5,000 Units Subcutaneous Q8H   multivitamin  1 tablet Oral QHS   nicotine  14 mg Transdermal Daily   rosuvastatin  10 mg Oral Daily   sertraline  50 mg Oral Daily   sevelamer carbonate  3,200 mg Oral TID WC   thiamine  100 mg Oral Daily  Continuous Infusions:  sodium chloride     sodium chloride     ferric gluconate (FERRLECIT) IVPB Stopped (05/20/21 1340)    Labs: Recent Labs  Lab 05/18/21 1209 05/18/21 1635 05/19/21 0503 05/20/21 0616 05/21/21 0704  NA 144   < > 140 137 136  K 6.5*   < > 5.0 4.5 3.8  CL 100   < > 100 100 97*  CO2 18*   < > 23 26 31   BUN 213*   < >  119* 54* 18  CREATININE 27.19*   < > 18.04* 10.70* 6.05*  CALCIUM 10.0   < > 9.0 9.3 8.6*  MG 2.7*  --   --   --   --   PHOS 9.1*   < > 6.6* 5.7* 4.9*  GLUCOSE 106*   < > 79 87 96   < > = values in this interval not displayed.  CBGs: 70-113 x24 hours    NUTRITION - FOCUSED PHYSICAL EXAM: Unable to perform at this time. Will attempt at follow-up.   Diet Order:   Diet Order             Diet renal with fluid restriction Fluid restriction: 1200 mL Fluid; Room service appropriate? Yes; Fluid consistency: Thin  Diet effective now                   EDUCATION NEEDS:   No education needs have been identified at this time  Skin:  Skin Assessment: Reviewed RN Assessment  Last BM:   11/10  Height:   Ht Readings from Last 1 Encounters:  05/18/21 5\' 8"  (1.727 m)    Weight:   Wt Readings from Last 1 Encounters:  05/20/21 66 kg    BMI:  Body mass index is 22.14 kg/m.  Estimated Nutritional Needs:   Kcal:  2000-2200  Protein:  100-110 grams  Fluid:  1L+UOP     Theone Stanley., MS, RD, LDN (she/her/hers) RD pager number and weekend/on-call pager number located in [place].

## 2021-05-21 NOTE — Progress Notes (Signed)
[facility] Progress Note   Subjective:   Feeling better, still having some nausea but appetite is improving. Denies SOB, CP, palpitations, dizziness and abdominal pain. Seen by psych yesterday, see notes. Pt is hoping to go to a SNF.   Objective Vitals:   05/20/21 1950 05/20/21 2005 05/21/21 0553 05/21/21 0915  BP:   (!) 157/85 (!) 150/76  Pulse:  65 71 78  Resp:  16 18 20   Temp:  97.8 F (36.6 C) 97.7 F (36.5 C) 98.2 F (36.8 C)  TempSrc:  Oral Oral Oral  SpO2:   98% 98%  Weight: 66 kg     Height:       Physical Exam General: WDWN male, alert and in NAD Heart: RRR, no murmur Lungs: CTA bilaterally Abdomen: Soft, non-tender, non-distended, +BS Extremities: No edema b/l lower extremities Dialysis Access: LUE AVF + t/b  Additional Objective Labs: Basic Metabolic Panel: Recent Labs  Lab 05/19/21 0503 05/20/21 0616 05/21/21 0704  NA 140 137 136  K 5.0 4.5 3.8  CL 100 100 97*  CO2 23 26 31   GLUCOSE 79 87 96  BUN 119* 54* 18  CREATININE 18.04* 10.70* 6.05*  CALCIUM 9.0 9.3 8.6*  PHOS 6.6* 5.7* 4.9*   Liver Function Tests: Recent Labs  Lab 05/18/21 1209 05/18/21 1635 05/19/21 0503 05/20/21 0616 05/21/21 0704  AST <5*  --   --   --   --   ALT 9  --   --   --   --   ALKPHOS 62  --   --   --   --   BILITOT 0.9  --   --   --   --   PROT 7.3  --   --   --   --   ALBUMIN 3.3*   < > 2.7* 2.7* 2.8*   < > = values in this interval not displayed.   No results for input(s): LIPASE, AMYLASE in the last 168 hours. CBC: Recent Labs  Lab 05/18/21 1209 05/19/21 0503 05/20/21 0616 05/21/21 0704  WBC 3.2* 2.3* 3.4* 3.4*  NEUTROABS 2.1  --   --   --   HGB 8.0* 7.0* 7.5* 8.7*  HCT 25.3* 21.7* 24.7* 28.1*  MCV 103.3* 100.5* 104.2* 106.0*  PLT 126* 101* 103* 108*   Blood Culture    Component Value Date/Time   SDES BLOOD RIGHT HAND 04/13/2021 0600   SPECREQUEST  04/13/2021 0600    BOTTLES DRAWN AEROBIC AND ANAEROBIC Blood Culture adequate volume    CULT  04/13/2021 0600    NO GROWTH 5 DAYS Performed at [facility] [address], [place], [place] [address]    REPTSTATUS 04/18/2021 FINAL 04/13/2021 0600    Cardiac Enzymes: No results for input(s): CKTOTAL, CKMB, CKMBINDEX, TROPONINI in the last 168 hours. CBG: Recent Labs  Lab 05/18/21 2028 05/18/21 2304 05/19/21 0557 05/20/21 0633 05/21/21 0658  GLUCAP 70 113* 108* 82 78   Iron Studies:  Recent Labs    05/18/21 1635  IRON 47  TIBC 248*  FERRITIN 304   @[email or web address] @ Studies/Results: No results found. Medications:  sodium chloride     sodium chloride     ferric gluconate (FERRLECIT) IVPB Stopped (05/20/21 1340)    amLODipine  10 mg Oral Daily   carvedilol  6.25 mg Oral BID WC   Chlorhexidine Gluconate Cloth  6 each Topical Q0600   cinacalcet  180 mg Oral Q T,Th,Sa-HD   darbepoetin (ARANESP) injection - DIALYSIS  200 mcg Intravenous Q Tue-HD   feeding supplement (NEPRO CARB STEADY)  237 mL Oral BID BM   folic acid  1 mg Oral Daily   heparin injection (subcutaneous)  5,000 Units Subcutaneous Q8H   multivitamin  1 tablet Oral QHS   nicotine  14 mg Transdermal Daily   rosuvastatin  10 mg Oral Daily   sertraline  50 mg Oral Daily   sevelamer carbonate  3,200 mg Oral TID WC   thiamine  100 mg Oral Daily    Dialysis Orders: [facility] T,Th,S 4 hrs 180NRe 400/Autoflow 1.5 68.8 kg 2.0K/2.0 Ca UFP 4 AVF -No Heparin -Cinacalcet 180 mg PO TIW -Mircera 200 mcg IV q 2 weeks (last dose 04/29/2021) -Calcitriol 2.75 mcg PO TIW  Assessment/Plan: Lethargy/AMS:  Uremia D/T noncompliance with HD. SCr 27.2 BUN>200.  Resolved with serial HD. BUN 18 this AM. Still some nausea but expect this will continue to improve.  Hyperkalemia: In setting of missed HD.Resolved with serial HD   ESRD -  T,Th,S Had not been to treatment since 05/01/2021. HD x3, now back on schedule with next HD planned for tomorrow.   Hypertension/volume  - Resumed home meds.Replaced Metoprolol with  coreg due to h/o cocaine abuse.  UF as tolerated.   Anemia  - HGB 8.7 today. No ESA since 04/29/21. Given Aranesp 200 mcg IV 11/8. Iron sat 19%  Loading with Fe.   Metabolic bone disease -  C Ca was elevated, now improving. Hold VDRA. PO4 elevated. Resume senispar and binders (Renvela)  Nutrition - Albumin 2.8, on  Renal diet, nepro  Tobacco/polysubstance abuse-per primary Social issues: Long standing issues with polysubstance abuse, homelessness. Consulted MSW. Pt wants to continue HD and is interested in SNF if he qualifies    Anice Paganini, PA-C 05/21/2021, 9:31 AM  [facility] Pager: [phone]

## 2021-05-21 NOTE — Consult Note (Signed)
[facility] Face-to-Face Psychiatry Consult   Reason for Consult:  Passive SI Referring Physician:  Philipp Ovens, MD Patient Identification: Raymond [name] MRN:  [id number] Principal Diagnosis: <principal problem not specified> Diagnosis:  Active Problems:   ESRD (end stage renal disease) on dialysis ([place])   Uremia   Severe episode of recurrent major depressive disorder, without psychotic features ([place])  Assessment  Raymond [name] is a 65 y.o. male admitted medically 05/18/2021 11:20 AM for AMS, Patient carries the psychiatric diagnoses of MDD and has a past medical history of   ESRD on hemodialysis Tuesday, Thursday, Saturday, COPD, hep C treated, pancytopenia, hypertension, CAD, PAD, COPD.Psychiatry was consulted for concern for passive SI.   He meets criteria for depression based on assessment and EMR.  Outpatient psychotropic medications include Zoloft and historically he has had a poor response to these medications. He was not compliant with medications prior to admission as evidenced by patient endorsing that he had stopped taking this medication months ago. On initial examination, patient appears to endorse symptoms of MDD and substance use disorder. We plan to continue to recommend psychotropic intervention and substance use outpatient .    On assessment today patient appears to be psychiatrically stable.  Patient endorses that his mood has overall continued to improve.  Patient's primary concern remains his substance use and he continues to appreciate resources.   Plan MDD, recurrent, moderate - Increase Zoloft to 50mg    Cocaine use disorder - SW consult for resources at OP   Safety At this time patient appears to be of low risk of harm to himself. This is based on assessment and information provided by other teams in the EMR. Recommend routine level of observation   Dispo: - Per primary  Thank you for this consult.  We will sign off at this time. Total Time spent with patient: 15  minutes  Subjective:   Raymond [name] is a 65 y.o. male patient admitted with AMS.  HPI: No significant overnight events.  On assessment this a.m. patient is noted to have recently finished a phone call with his brother.  Patient reports overall the phone call went well.  Patient reports that his mood today is "upbeat."  Patient reports that he continues to not sleep very well, but he is doing okay this morning.  Patient reports that his appetite is also coming back.  Patient denies SI, HI and AVH.  Past Medical History:  Past Medical History:  Diagnosis Date   Anemia    CAD (coronary artery disease)    a. 03/2018: cath showing 35 to 30% stenosis along the LAD, luminal irregularities along the RCA, and angiographically normal LCx   COPD (chronic obstructive pulmonary disease) (HCC)    ESRD (end stage renal disease) on dialysis ([place])    "MWF; [facility]" (07/22/17)   Hepatitis C    Still positive s/p liver biopsy at [facility]  and interferon therapy for 6 months. Most recent lab work was on 10/24/12   Hepatitis C    "took the tx; gone now" (12/05/2016)  Was treated   History of blood transfusion ~ 2012/2013   "related to my kidneys; blood was low"   Hypertension    Peripheral arterial disease ([place])    Substance abuse ([place])    Thyroid disease    Type 2 diabetes mellitus with other diabetic kidney complication ([place]) 2/99/2426    Past Surgical History:  Procedure Laterality Date   ABDOMINAL AORTOGRAM W/LOWER EXTREMITY N/A 02/08/2018   Procedure:  ABDOMINAL AORTOGRAM W/LOWER EXTREMITY;  Surgeon: Conrad Fieldon, MD;  Location: [place] CV [facility];  Service: Cardiovascular;  Laterality: N/A;   ABDOMINAL AORTOGRAM W/LOWER EXTREMITY Bilateral 06/15/2020   Procedure: ABDOMINAL AORTOGRAM W/LOWER EXTREMITY;  Surgeon: Waynetta Sandy, MD;  Location: [place] [facility];  Service: Cardiovascular;  Laterality: Bilateral;   AV FISTULA PLACEMENT Left Aug. 2013 ?   BIOPSY  01/17/2020   Procedure:  BIOPSY;  Surgeon: Jackquline Denmark, MD;  Location: [place];  Service: Endoscopy;;   COLONOSCOPY WITH PROPOFOL N/A 08/21/2018   Procedure: COLONOSCOPY WITH PROPOFOL;  Surgeon: Yetta Flock, MD;  Location: [place];  Service: Gastroenterology;  Laterality: N/A;   ENTEROSCOPY N/A 01/17/2020   Procedure: ENTEROSCOPY;  Surgeon: Jackquline Denmark, MD;  Location: [facility] ENDOSCOPY;  Service: Endoscopy;  Laterality: N/A;   ENTEROSCOPY N/A 07/31/2020   Procedure: ENTEROSCOPY;  Surgeon: Doran Stabler, MD;  Location: [place];  Service: Gastroenterology;  Laterality: N/A;   ENTEROSCOPY N/A 01/27/2021   Procedure: ENTEROSCOPY;  Surgeon: Yetta Flock, MD;  Location: [facility] ENDOSCOPY;  Service: Gastroenterology;  Laterality: N/A;   ESOPHAGOGASTRODUODENOSCOPY (EGD) WITH PROPOFOL N/A 08/20/2018   Procedure: ESOPHAGOGASTRODUODENOSCOPY (EGD) WITH PROPOFOL;  Surgeon: Gatha Mayer, MD;  Location: [place];  Service: Endoscopy;  Laterality: N/A;   FEMORAL-POPLITEAL BYPASS GRAFT Left 04/11/2018   Procedure: BYPASS GRAFT FEMORAL-POPLITEAL ARTERY LEFT LEG;  Surgeon: Rosetta Posner, MD;  Location: [facility] OR;  Service: Vascular;  Laterality: Left;   GIVENS CAPSULE STUDY N/A 07/31/2020   Procedure: GIVENS CAPSULE STUDY;  Surgeon: Doran Stabler, MD;  Location: [place];  Service: Gastroenterology;  Laterality: N/A;   HOT HEMOSTASIS N/A 08/20/2018   Procedure: HOT HEMOSTASIS (ARGON PLASMA COAGULATION/BICAP);  Surgeon: Gatha Mayer, MD;  Location: [facility] ENDOSCOPY;  Service: Endoscopy;  Laterality: N/A;   HOT HEMOSTASIS N/A 01/17/2020   Procedure: HOT HEMOSTASIS (ARGON PLASMA COAGULATION/BICAP);  Surgeon: Jackquline Denmark, MD;  Location: [facility] ENDOSCOPY;  Service: Endoscopy;  Laterality: N/A;   HOT HEMOSTASIS N/A 01/27/2021   Procedure: HOT HEMOSTASIS (ARGON PLASMA COAGULATION/BICAP);  Surgeon: Yetta Flock, MD;  Location: [facility] ENDOSCOPY;  Service: Gastroenterology;  Laterality: N/A;   LEFT HEART CATH AND  CORONARY ANGIOGRAPHY N/A 03/29/2018   Procedure: LEFT HEART CATH AND CORONARY ANGIOGRAPHY;  Surgeon: Nelva Bush, MD;  Location: [place] [facility];  Service: Cardiovascular;  Laterality: N/A;   LIVER BIOPSY     ORIF ULNAR FRACTURE Left 05/18/2017   Procedure: OPEN REDUCTION INTERNAL FIXATION (ORIF) ULNAR FRACTURE;  Surgeon: Iran Planas, MD;  Location: [place];  Service: Orthopedics;  Laterality: Left;   PERIPHERAL VASCULAR INTERVENTION Right 06/15/2020   Procedure: PERIPHERAL VASCULAR INTERVENTION;  Surgeon: Waynetta Sandy, MD;  Location: [place] [facility];  Service: Cardiovascular;  Laterality: Right;   POLYPECTOMY  08/21/2018   Procedure: POLYPECTOMY;  Surgeon: Yetta Flock, MD;  Location: [place];  Service: Gastroenterology;;   Earney Mallet N/A 12/11/2012   Procedure: Ear[place] Mallet;  Surgeon: Serafina Mitchell, MD;  Location: [facility] CATH LAB;  Service: Cardiovascular;  Laterality: N/A;   WOUND EXPLORATION Left 06/15/2020   Procedure: GROIN  EXPLORATION;  Surgeon: Waynetta Sandy, MD;  Location: [facility] OR;  Service: Vascular;  Laterality: Left;   Family History:  Family History  Problem Relation Age of Onset   Diabetes Father    Hypertension Father    Heart disease Father     Social History:  Social History   Substance and Sexual Activity  Alcohol Use  Yes   Alcohol/week: 10.0 standard drinks   Types: 10 Glasses of wine per week     Social History   Substance and Sexual Activity  Drug Use Not Currently   Types: Cocaine    Social History   Socioeconomic History   Marital status: Single    Spouse name: Not on file   Number of children: 2   Years of education: Not on file   Highest education level: 11th grade  Occupational History   Not on file  Tobacco Use   Smoking status: Every Day    Packs/day: 0.20    Years: 25.00    Pack years: 5.00    Types: Cigarettes    Last attempt to quit: 08/01/2011    Years since quitting: 9.8   Smokeless tobacco:  Never   Tobacco comments:    he smokes 6 cigarettes a week  Vaping Use   Vaping Use: Never used  Substance and Sexual Activity   Alcohol use: Yes    Alcohol/week: 10.0 standard drinks    Types: 10 Glasses of wine per week   Drug use: Not Currently    Types: Cocaine   Sexual activity: Not on file  Other Topics Concern   Not on file  Social History Narrative   Patient is living in a apartment alone. Professional cement finisher. Has 2 children (80yo & 63yo sons). Plans to make HCPOA for sister Neoma Laming to make decisions for him should he need them.    Social Determinants of Health   Financial Resource Strain: Not on file  Food Insecurity: Not on file  Transportation Needs: Not on file  Physical Activity: Not on file  Stress: Not on file  Social Connections: Not on file   Additional Social History:    Allergies:  No Known Allergies  Labs:  Results for orders placed or performed during the hospital encounter of 05/18/21 (from the past 48 hour(s))  Renal function panel     Status: Abnormal   Collection Time: 05/20/21  6:16 AM  Result Value Ref Range   Sodium 137 135 - 145 mmol/L   Potassium 4.5 3.5 - 5.1 mmol/L   Chloride 100 98 - 111 mmol/L   CO2 26 22 - 32 mmol/L   Glucose, Bld 87 70 - 99 mg/dL    Comment: Glucose reference range applies only to samples taken after fasting for at least 8 hours.   BUN 54 (H) 8 - 23 mg/dL   Creatinine, Ser 10.70 (H) 0.61 - 1.24 mg/dL    Comment: DELTA CHECK NOTED   Calcium 9.3 8.9 - 10.3 mg/dL   Phosphorus 5.7 (H) 2.5 - 4.6 mg/dL   Albumin 2.7 (L) 3.5 - 5.0 g/dL   GFR, Estimated 5 (L) >60 mL/min    Comment: (NOTE) Calculated using the CKD-EPI Creatinine Equation (2021)    Anion gap 11 5 - 15    Comment: Performed at [facility] [address], [place], [place] [address]  CBC     Status: Abnormal   Collection Time: 05/20/21  6:16 AM  Result Value Ref Range   WBC 3.4 (L) 4.0 - 10.5 K/uL   RBC 2.37 (L) 4.22 - 5.81 MIL/uL    Hemoglobin 7.5 (L) 13.0 - 17.0 g/dL   HCT 24.7 (L) 39.0 - 52.0 %   MCV 104.2 (H) 80.0 - 100.0 fL   MCH 31.6 26.0 - 34.0 pg   MCHC 30.4 30.0 - 36.0 g/dL   RDW 19.6 (H)  11.5 - 15.5 %   Platelets 103 (L) 150 - 400 K/uL    Comment: Immature Platelet Fraction may be clinically indicated, consider ordering this additional test [id number] CONSISTENT WITH PREVIOUS RESULT REPEATED TO VERIFY    nRBC 0.0 0.0 - 0.2 %    Comment: Performed at [facility], [place] [address] [place], [place] [address]  Glucose, capillary     Status: None   Collection Time: 05/20/21  6:33 AM  Result Value Ref Range   Glucose-Capillary 82 70 - 99 mg/dL    Comment: Glucose reference range applies only to samples taken after fasting for at least 8 hours.  Glucose, capillary     Status: None   Collection Time: 05/21/21  6:58 AM  Result Value Ref Range   Glucose-Capillary 78 70 - 99 mg/dL    Comment: Glucose reference range applies only to samples taken after fasting for at least 8 hours.   Comment 1 Notify RN    Comment 2 Document in Chart   Renal function panel     Status: Abnormal   Collection Time: 05/21/21  7:04 AM  Result Value Ref Range   Sodium 136 135 - 145 mmol/L   Potassium 3.8 3.5 - 5.1 mmol/L   Chloride 97 (L) 98 - 111 mmol/L   CO2 31 22 - 32 mmol/L   Glucose, Bld 96 70 - 99 mg/dL    Comment: Glucose reference range applies only to samples taken after fasting for at least 8 hours.   BUN 18 8 - 23 mg/dL   Creatinine, Ser 6.05 (H) 0.61 - 1.24 mg/dL    Comment: DELTA CHECK NOTED DIALYSIS    Calcium 8.6 (L) 8.9 - 10.3 mg/dL   Phosphorus 4.9 (H) 2.5 - 4.6 mg/dL   Albumin 2.8 (L) 3.5 - 5.0 g/dL   GFR, Estimated 10 (L) >60 mL/min    Comment: (NOTE) Calculated using the CKD-EPI Creatinine Equation (2021)    Anion gap 8 5 - 15    Comment: Performed at [facility] [address], [place], [place] [address]  CBC     Status: Abnormal   Collection Time: 05/21/21  7:04 AM  Result Value Ref  Range   WBC 3.4 (L) 4.0 - 10.5 K/uL   RBC 2.65 (L) 4.22 - 5.81 MIL/uL   Hemoglobin 8.7 (L) 13.0 - 17.0 g/dL   HCT 28.1 (L) 39.0 - 52.0 %   MCV 106.0 (H) 80.0 - 100.0 fL   MCH 32.8 26.0 - 34.0 pg   MCHC 31.0 30.0 - 36.0 g/dL   RDW 19.4 (H) 11.5 - 15.5 %   Platelets 108 (L) 150 - 400 K/uL    Comment: Immature Platelet Fraction may be clinically indicated, consider ordering this additional test [id number] CONSISTENT WITH PREVIOUS RESULT REPEATED TO VERIFY    nRBC 0.0 0.0 - 0.2 %    Comment: Performed at [facility], [place] [address] [place], [place] [address]    Current Facility-Administered Medications  Medication Dose Route Frequency Provider Last Rate Last Admin   0.9 %  sodium chloride infusion  100 mL Intravenous PRN Gaylan Gerold, DO       0.9 %  sodium chloride infusion  100 mL Intravenous PRN Gaylan Gerold, DO       acetaminophen (TYLENOL) tablet 650 mg  650 mg Oral Q6H PRN Gaylan Gerold, DO   650 mg at 05/20/21 1206   Or   acetaminophen (TYLENOL) suppository 650 mg  650 mg Rectal Q6H PRN Gaylan Gerold, DO       amLODipine (NORVASC) tablet 10 mg  10 mg Oral Daily France Ravens, MD   10 mg at 05/21/21 0845   carvedilol (COREG) tablet 6.25 mg  6.25 mg Oral BID WC Valentina Gu, NP   6.25 mg at 05/21/21 0844   Chlorhexidine Gluconate Cloth 2 % PADS 6 each  6 each Topical Q0600 Gaylan Gerold, DO       Chlorhexidine Gluconate Cloth 2 % PADS 6 each  6 each Topical Q0600 Janalee Dane, PA-C       cinacalcet (SENSIPAR) tablet 180 mg  180 mg Oral Q T,Th,Sa-HD Gaylan Gerold, DO   180 mg at 05/20/21 1001   Darbepoetin Alfa (ARANESP) injection 200 mcg  200 mcg Intravenous Q Tue-HD Gaylan Gerold, DO   200 mcg at 05/18/21 1859   feeding supplement (NEPRO CARB STEADY) liquid 237 mL  237 mL Oral BID BM Gaylan Gerold, DO 0 mL/hr at 05/19/21 0600 237 mL at 05/21/21 0843   ferric gluconate (FERRLECIT) 250 mg in sodium chloride 0.9 % 250 mL IVPB  250 mg Intravenous Q T,Th,Sa-HD Valentina Gu, NP   Stopped at 82/42/35 3614   folic acid (FOLVITE) tablet 1 mg  1 mg Oral Daily Gaylan Gerold, DO   1 mg at 05/21/21 0848   heparin injection 5,000 Units  5,000 Units Subcutaneous Q8H Gaylan Gerold, DO   5,000 Units at 05/20/21 2145   ipratropium-albuterol (DUONEB) 0.5-2.5 (3) MG/3ML nebulizer solution 3 mL  3 mL Nebulization Q6H PRN Gaylan Gerold, DO       lidocaine (PF) (XYLOCAINE) 1 % injection 5 mL  5 mL Intradermal PRN Gaylan Gerold, DO       lidocaine-prilocaine (EMLA) cream 1 application  1 application Topical PRN Gaylan Gerold, DO       melatonin tablet 3 mg  3 mg Oral QHS PRN Wayland Denis, MD   3 mg at 05/20/21 2145   multivitamin (RENA-VIT) tablet 1 tablet  1 tablet Oral QHS Gaylan Gerold, DO   1 tablet at 05/20/21 2145   nicotine (NICODERM CQ - dosed in mg/24 hours) patch 14 mg  14 mg Transdermal Daily France Ravens, MD   14 mg at 05/21/21 0846   ondansetron (ZOFRAN) injection 4 mg  4 mg Intravenous Q8H PRN Wayland Denis, MD   4 mg at 05/20/21 1952   pentafluoroprop-tetrafluoroeth (GEBAUERS) aerosol 1 application  1 application Topical PRN Gaylan Gerold, DO       polyethylene glycol (MIRALAX / GLYCOLAX) packet 17 g  17 g Oral Daily PRN Gaylan Gerold, DO       rosuvastatin (CRESTOR) tablet 10 mg  10 mg Oral Daily Gaylan Gerold, DO   10 mg at 05/21/21 0849   sertraline (ZOLOFT) tablet 50 mg  50 mg Oral Daily France Ravens, MD   50 mg at 05/21/21 0847   sevelamer carbonate (RENVELA) tablet 3,200 mg  3,200 mg Oral TID WC Gaylan Gerold, DO   3,200 mg at 05/21/21 0848   thiamine tablet 100 mg  100 mg Oral Daily Gaylan Gerold, DO   100 mg at 05/21/21 0845   Facility-Administered Medications Ordered in Other Encounters  Medication Dose Route Frequency Provider Last Rate Last Admin   midazolam (VERSED) 5 MG/5ML injection    Anesthesia Intra-op Sammie Bench, CRNA   2 mg at 04/11/18 1042    Psychiatric Specialty Exam:  Presentation  General Appearance: Appropriate for  Environment  Eye  Contact:Good  Speech:Clear and Coherent  Speech Volume:Normal  Handedness:No data recorded  Mood and Affect  Mood:-- ("upbeat")  Affect:Appropriate; Congruent   Thought Process  Thought Processes:Coherent  Descriptions of Associations:Intact  Orientation:Full (Time, Place and Person)  Thought Content:Logical  History of Schizophrenia/Schizoaffective disorder:No data recorded Duration of Psychotic Symptoms:No data recorded Hallucinations:Hallucinations: None  Ideas of Reference:None  Suicidal Thoughts:Suicidal Thoughts: No  Homicidal Thoughts:Homicidal Thoughts: No   Sensorium  Memory:Immediate Good; Recent Good; Remote Good  Judgment:-- (Improving)  Insight:Shallow   Executive Functions  Concentration:Fair  Attention Span:Good  Recall:No data recorded Fund of Knowledge:Good  Language:Good   Psychomotor Activity  Psychomotor Activity:Psychomotor Activity: Normal   Assets  Assets:Resilience   Sleep  Sleep:Sleep: Poor   Physical Exam: Physical Exam HENT:     Head: Normocephalic and atraumatic.  Pulmonary:     Effort: Pulmonary effort is normal.  Neurological:     Mental Status: He is alert and oriented to person, place, and time.   Review of Systems  Psychiatric/Behavioral:  Negative for hallucinations and suicidal ideas.   Blood pressure (!) 150/76, pulse 78, temperature 98.2 F (36.8 C), temperature source Oral, resp. rate 20, height 5\' 8"  (1.727 m), weight 66 kg, SpO2 98 %. Body mass index is 22.14 kg/m.  PGY-2  Freida Busman, MD 05/21/2021 12:28 PM

## 2021-05-22 DIAGNOSIS — E8779 Other fluid overload: Secondary | ICD-10-CM

## 2021-05-22 DIAGNOSIS — N179 Acute kidney failure, unspecified: Secondary | ICD-10-CM

## 2021-05-22 LAB — BASIC METABOLIC PANEL
Anion gap: 9 (ref 5–15)
BUN: 28 mg/dL — ABNORMAL HIGH (ref 8–23)
CO2: 30 mmol/L (ref 22–32)
Calcium: 8.4 mg/dL — ABNORMAL LOW (ref 8.9–10.3)
Chloride: 98 mmol/L (ref 98–111)
Creatinine, Ser: 7.88 mg/dL — ABNORMAL HIGH (ref 0.61–1.24)
GFR, Estimated: 7 mL/min — ABNORMAL LOW (ref >60)
Glucose, Bld: 74 mg/dL (ref 70–99)
Potassium: 4.1 mmol/L (ref 3.5–5.1)
Sodium: 137 mmol/L (ref 135–145)

## 2021-05-22 LAB — CBC
HCT: 26.1 % — ABNORMAL LOW (ref 39.0–52.0)
Hemoglobin: 7.7 g/dL — ABNORMAL LOW (ref 13.0–17.0)
MCH: 31.8 pg (ref 26.0–34.0)
MCHC: 29.5 g/dL — ABNORMAL LOW (ref 30.0–36.0)
MCV: 107.9 fL — ABNORMAL HIGH (ref 80.0–100.0)
Platelets: 116 10*3/uL — ABNORMAL LOW (ref 150–400)
RBC: 2.42 MIL/uL — ABNORMAL LOW (ref 4.22–5.81)
RDW: 19.1 % — ABNORMAL HIGH (ref 11.5–15.5)
WBC: 3.8 10*3/uL — ABNORMAL LOW (ref 4.0–10.5)
nRBC: 0 % (ref 0.0–0.2)

## 2021-05-22 MED ORDER — RAMELTEON 8 MG PO TABS
8.0000 mg | ORAL_TABLET | Freq: Every day | ORAL | Status: DC
  Administered 2021-05-22 – 2021-05-24 (×3): 8 mg via ORAL
  Filled 2021-05-22 (×4): qty 1

## 2021-05-22 MED ORDER — BISMUTH SUBSALICYLATE 262 MG/15ML PO SUSP
30.0000 mL | ORAL | Status: DC | PRN
  Administered 2021-05-22 – 2021-05-23 (×2): 30 mL via ORAL
  Filled 2021-05-22: qty 236

## 2021-05-22 NOTE — Progress Notes (Signed)
   05/22/21 1155  Vitals  Temp 98.4 F (36.9 C)  Temp Source Oral  BP (!) 143/76  MAP (mmHg) 98  BP Location Right Arm  BP Method Automatic  Patient Position (if appropriate) Lying  Pulse Rate 65  Pulse Rate Source Monitor  Resp 15  Oxygen Therapy  SpO2 97 %  O2 Device Room Air  Pain Assessment  Pain Scale 0-10  Pain Score 0  Post-Hemodialysis Assessment  Rinseback Volume (mL) 250 mL  KECN 280 V  Dialyzer Clearance Lightly streaked  Duration of HD Treatment -hour(s) 4 hour(s)  Hemodialysis Intake (mL) 800 mL  UF Total -Machine (mL) 2800 mL  Net UF (mL) 2000 mL  Tolerated HD Treatment Yes  Post-Hemodialysis Comments tx complete-pt stable  AVG/AVF Arterial Site Held (minutes) 10 minutes  AVG/AVF Venous Site Held (minutes) 10 minutes  Fistula / Graft Left Upper arm Arteriovenous fistula  Placement Date/Time: 01/26/21 0104   Placed prior to admission: Yes  Orientation: Left  Access Location: Upper arm  Access Type: Arteriovenous fistula  Site Condition No complications  Fistula / Graft Assessment Present;Thrill  Status Deaccessed  HD tx complete, pt stable.

## 2021-05-22 NOTE — Progress Notes (Signed)
[facility] Progress Note   Subjective:   Pt seen on HD. Reports he is still having trouble sleeping and some nausea, but appetite is better. Denies SOB, CP, palpitations, dizziness. Reports his sister has found him a place to stay if he does not qualify for SNF.   Objective Vitals:   05/22/21 0735 05/22/21 0755 05/22/21 0800 05/22/21 0830  BP: (!) 148/83 (!) 149/77 128/74 (!) 147/80  Pulse:  76 74 70  Resp: 18 18 15 17   Temp:      TempSrc:      SpO2:  98% 96%   Weight:      Height:       Physical Exam General: WDWN male, alert and in NAD Heart: RRR, no murmur Lungs: CTA bilaterally Abdomen: Soft, non-tender, non-distended, +BS Extremities: No edema b/l lower extremities Dialysis Access: LUE AVF accessed  Additional Objective Labs: Basic Metabolic Panel: Recent Labs  Lab 05/19/21 0503 05/20/21 0616 05/21/21 0704 05/22/21 0351  NA 140 137 136 137  K 5.0 4.5 3.8 4.1  CL 100 100 97* 98  CO2 23 26 31 30   GLUCOSE 79 87 96 74  BUN 119* 54* 18 28*  CREATININE 18.04* 10.70* 6.05* 7.88*  CALCIUM 9.0 9.3 8.6* 8.4*  PHOS 6.6* 5.7* 4.9*  --    Liver Function Tests: Recent Labs  Lab 05/18/21 1209 05/18/21 1635 05/19/21 0503 05/20/21 0616 05/21/21 0704  AST <5*  --   --   --   --   ALT 9  --   --   --   --   ALKPHOS 62  --   --   --   --   BILITOT 0.9  --   --   --   --   PROT 7.3  --   --   --   --   ALBUMIN 3.3*   < > 2.7* 2.7* 2.8*   < > = values in this interval not displayed.   No results for input(s): LIPASE, AMYLASE in the last 168 hours. CBC: Recent Labs  Lab 05/18/21 1209 05/19/21 0503 05/20/21 0616 05/21/21 0704 05/22/21 0351  WBC 3.2* 2.3* 3.4* 3.4* 3.8*  NEUTROABS 2.1  --   --   --   --   HGB 8.0* 7.0* 7.5* 8.7* 7.7*  HCT 25.3* 21.7* 24.7* 28.1* 26.1*  MCV 103.3* 100.5* 104.2* 106.0* 107.9*  PLT 126* 101* 103* 108* 116*   Blood Culture    Component Value Date/Time   SDES BLOOD RIGHT HAND 04/13/2021 0600   SPECREQUEST   04/13/2021 0600    BOTTLES DRAWN AEROBIC AND ANAEROBIC Blood Culture adequate volume   CULT  04/13/2021 0600    NO GROWTH 5 DAYS Performed at [facility], [place] [address], [place], [place] [address]    REPTSTATUS 04/18/2021 FINAL 04/13/2021 0600    Cardiac Enzymes: No results for input(s): CKTOTAL, CKMB, CKMBINDEX, TROPONINI in the last 168 hours. CBG: Recent Labs  Lab 05/18/21 2028 05/18/21 2304 05/19/21 0557 05/20/21 0633 05/21/21 0658  GLUCAP 70 113* 108* 82 78   Iron Studies: No results for input(s): IRON, TIBC, TRANSFERRIN, FERRITIN in the last 72 hours. @lablastinr3 @ Studies/Results: No results found. Medications:  sodium chloride     sodium chloride     ferric gluconate (FERRLECIT) IVPB 250 mg (05/22/21 0810)    amLODipine  10 mg Oral Daily   carvedilol  6.25 mg Oral BID WC   Chlorhexidine Gluconate Cloth  6 each Topical Q0600  Chlorhexidine Gluconate Cloth  6 each Topical Q0600   cinacalcet  180 mg Oral Q T,Th,Sa-HD   darbepoetin (ARANESP) injection - DIALYSIS  200 mcg Intravenous Q Tue-HD   feeding supplement (NEPRO CARB STEADY)  237 mL Oral BID BM   folic acid  1 mg Oral Daily   heparin injection (subcutaneous)  5,000 Units Subcutaneous Q8H   multivitamin  1 tablet Oral QHS   nicotine  14 mg Transdermal Daily   rosuvastatin  10 mg Oral Daily   sertraline  50 mg Oral Daily   sevelamer carbonate  3,200 mg Oral TID WC   thiamine  100 mg Oral Daily    Dialysis Orders: [facility] T,Th,S 4 hrs 180NRe 400/Autoflow 1.5 68.8 kg 2.0K/2.0 Ca UFP 4 AVF -No Heparin -Cinacalcet 180 mg PO TIW -Mircera 200 mcg IV q 2 weeks (last dose 04/29/2021) -Calcitriol 2.75 mcg PO TIW  Assessment/Plan: Lethargy/AMS:  Uremia D/T noncompliance with HD. SCr 27.2 BUN>200.  Resolved with serial HD. Still some nausea but expect this will continue to improve.  Hyperkalemia: In setting of missed HD.Resolved with serial HD   ESRD -  T,Th,S Had not been to treatment since 05/01/2021. HD  x3, now back on schedule.  Hypertension/volume  - Resumed home meds.Replaced Metoprolol with coreg due to h/o cocaine abuse.  UF as tolerated.   Anemia  - HGB 7.7 today, has been in the 7-8 range. No ESA since 04/29/21. Given Aranesp 200 mcg IV 11/8. Iron sat 19%  Loading with Fe.   Metabolic bone disease -  C Ca was elevated, now improving. Hold VDRA. PO4 elevated. Resume senispar and binders (Renvela)  Nutrition - Albumin 2.8, on  Renal diet, nepro  Tobacco/polysubstance abuse-per primary Social issues: Long standing issues with polysubstance abuse, homelessness. Consulted MSW. Pt wants to continue HD and is interested in SNF if he qualifies  Anice Paganini, PA-C 05/22/2021, 9:06 AM  [facility] Pager: [phone]

## 2021-05-22 NOTE — Progress Notes (Addendum)
HD#4 Subjective:  Overnight Events: none   Patient seen and assessed at dialysis today.  Patient reports that he is doing better today.  Patient reports feeling less depressed, more clear minded today.  Patient is future oriented and denies SI.  Patient reports tolerating dialysis well.  Patient reports some insomnia for past few days.  Discussed plan with patient to work on [place] while he is being dialyzed.  Patient states that he is agreeable to going to SNF but if he is unable to he will go to nieces to live as well as attend PACE program during the day.  Objective:  Vital signs in last 24 hours: Vitals:   05/21/21 0915 05/21/21 1632 05/21/21 2024 05/22/21 0459  BP: (!) 150/76 129/73 140/76 134/78  Pulse: 78 76 74 68  Resp: 20 18 19 19   Temp: 98.2 F (36.8 C) 98.6 F (37 C) 98.6 F (37 C) 98.3 F (36.8 C)  TempSrc: Oral Oral Oral   SpO2: 98% 93% 93% 93%  Weight:      Height:       Supplemental O2: Nasal Cannula SpO2: 93 % O2 Flow Rate (L/min): 2 L/min   Physical Exam:  Physical Exam Constitutional:      Appearance: Normal appearance.  HENT:     Head: Normocephalic.  Cardiovascular:     Rate and Rhythm: Normal rate and regular rhythm.     Pulses: Normal pulses.     Heart sounds: Normal heart sounds. No murmur heard.   No friction rub. No gallop.  Pulmonary:     Effort: Pulmonary effort is normal. No respiratory distress.     Breath sounds: Normal breath sounds. No wheezing or rales.  Abdominal:     General: Abdomen is flat. Bowel sounds are normal.     Palpations: Abdomen is soft.  Skin:    General: Skin is warm and dry.  Neurological:     Mental Status: He is alert.    Filed Weights   05/20/21 0451 05/20/21 0811 05/20/21 1950  Weight: 66.9 kg 67.6 kg 66 kg     Intake/Output Summary (Last 24 hours) at 05/22/2021 0659 Last data filed at 05/21/2021 0855 Gross per 24 hour  Intake 120 ml  Output --  Net 120 ml    Net IO Since  Admission: -4,230 mL [05/22/21 0659]  Pertinent Labs: CBC Latest Ref Rng & Units 05/22/2021 05/21/2021 05/20/2021  WBC 4.0 - 10.5 K/uL 3.8(L) 3.4(L) 3.4(L)  Hemoglobin 13.0 - 17.0 g/dL 7.7(L) 8.7(L) 7.5(L)  Hematocrit 39.0 - 52.0 % 26.1(L) 28.1(L) 24.7(L)  Platelets 150 - 400 K/uL 116(L) 108(L) 103(L)    CMP Latest Ref Rng & Units 05/22/2021 05/21/2021 05/20/2021  Glucose 70 - 99 mg/dL 74 96 87  BUN 8 - 23 mg/dL 28(H) 18 54(H)  Creatinine 0.61 - 1.24 mg/dL 7.88(H) 6.05(H) 10.70(H)  Sodium 135 - 145 mmol/L 137 136 137  Potassium 3.5 - 5.1 mmol/L 4.1 3.8 4.5  Chloride 98 - 111 mmol/L 98 97(L) 100  CO2 22 - 32 mmol/L 30 31 26   Calcium 8.9 - 10.3 mg/dL 8.4(L) 8.6(L) 9.3  Total Protein 6.5 - 8.1 g/dL - - -  Total Bilirubin 0.3 - 1.2 mg/dL - - -  Alkaline Phos 38 - 126 U/L - - -  AST 15 - 41 U/L - - -  ALT 0 - 44 U/L - - -    Imaging: No results found.  Assessment/Plan:   Active Problems:   ESRD (  end stage renal disease) on dialysis [facility])   Uremia   Severe episode of recurrent major depressive disorder, without psychotic features [facility])   Patient Summary: Patient is a 65 year old male with history of ESRD on hemodialysis Tuesday, Thursday, Saturday (non-compliant), medication noncompliance, COPD, hep C treated, pancytopenia, hypertension, CAD, PAD, COPD presented to the ED on 05/18/2021 presented with altered mental status and admitted for uremia. AMS likely due to missing HD. Depression may be contributory to current presentation as well.   Active Problems: ESRD (end stage renal disease) on dialysis ([place])   #ESRD on HD Patient reports being noncompliant with hemodialysis since hospitalization since 10/6. Patient reports this is due to worsening depression but may also be due to hypoxic brain injury (due to PEA arrest last hospitalization) Upon evaluation, patient appears to be more oriented today and less confused. -BUN 213 on admission > 18 -Cr 27.19 on admission >  6.05 -Nephrology consulted. Greatly appreciate assistance and recommendations. -Completed serial HD  -Next scheduled HD 11/12 -Continue renal function panel monitoring -Aranesp injection 200 mcg every Tuesday during HD -Sensipar 180 mg during HD -Pending SNF placement   Insomnia -Added ramelteon 8 mg qhs    Hypertension likely 2/2 medication noncompliance -Most recent 128/74.  -HD will likely help also with hypertension as well -Amlodipine 10 mg qd -Carvedilol 12.5 mg bid -We will continue to monitor blood pressures   Hyperphosphatemia -Phosphorus 9.1 on admission, now 4.9 -Continue Renvela 3200 mg qd tid   COPD -99% on 2 L [place] -Duoneb 3 mL q6 hour PRN for wheezing/SOB   MDD Patient meets criteria for major depressive disorder. -Zoloft 50 mg qd -Consulted Psychiatry for medication management and better understanding of patient's mental health.  Greatly appreciate recommendations -Recommend outpatient behavioral health follow-up for medication management and possibly therapy   Macrocytic Anemia likely due to chronic alcohol use -Hgb 8.7 today -Iron 47 -TIBC 248 -Negative ethanol -B9/b12 normal last hospitalization 1 month ago   Hx of Hypoglycemia PEA arrest Patient has had multiple hospitalization due to hypoglycemia.  During previous hospitalization, insulinoma and adrenal insufficiency had been ruled out.  More likely that hypoglycemia is related to ESRD and poor p.o. intake at this time. -Continue to monitor CBG   Alcohol Use Disorder Substance Use Disorder Nicotine Use Disorder -Negative alcohol -CIWA score: 2, 0 -Nicoderm patch 14 mg for nicotine replacement therapy   Dyslipidemia -Resumed home med Crestor 10 mg daily  Diet: Renal IVF: None,None VTE: Enoxaparin Code: Full PT/OT recs: Pending, none. TOC recs: pending   Dispo: Pending improved mental status and PT OT recommendations  France Ravens, MD 05/22/2021, 6:59 AM Pager: [phone]  Please  contact the on call pager after 5 pm and on weekends at [phone].

## 2021-05-22 NOTE — Plan of Care (Signed)
  Problem: Education: Goal: Knowledge of General Education information will improve Description Including pain rating scale, medication(s)/side effects and non-pharmacologic comfort measures Outcome: Progressing   Problem: Health Behavior/Discharge Planning: Goal: Ability to manage health-related needs will improve Outcome: Progressing   Problem: Clinical Measurements: Goal: Ability to maintain clinical measurements within normal limits will improve Outcome: Progressing Goal: Will remain free from infection Outcome: Progressing Goal: Diagnostic test results will improve Outcome: Progressing Goal: Respiratory complications will improve Outcome: Progressing Goal: Cardiovascular complication will be avoided Outcome: Progressing   Problem: Activity: Goal: Risk for activity intolerance will decrease Outcome: Progressing   Problem: Coping: Goal: Level of anxiety will decrease Outcome: Progressing   Problem: Elimination: Goal: Will not experience complications related to bowel motility Outcome: Progressing Goal: Will not experience complications related to urinary retention Outcome: Progressing   Problem: Pain Managment: Goal: General experience of comfort will improve Outcome: Progressing   Problem: Safety: Goal: Ability to remain free from injury will improve Outcome: Progressing   Problem: Skin Integrity: Goal: Risk for impaired skin integrity will decrease Outcome: Progressing   Problem: Education: Goal: Knowledge of disease and its progression will improve Outcome: Progressing Goal: Individualized Educational Video(s) Outcome: Progressing   Problem: Fluid Volume: Goal: Compliance with measures to maintain balanced fluid volume will improve Outcome: Progressing   Problem: Health Behavior/Discharge Planning: Goal: Ability to manage health-related needs will improve Outcome: Progressing   Problem: Nutritional: Goal: Ability to make healthy dietary choices  will improve Outcome: Progressing   Problem: Clinical Measurements: Goal: Complications related to the disease process, condition or treatment will be avoided or minimized Outcome: Progressing   

## 2021-05-23 DIAGNOSIS — I120 Hypertensive chronic kidney disease with stage 5 chronic kidney disease or end stage renal disease: Principal | ICD-10-CM

## 2021-05-23 DIAGNOSIS — F1910 Other psychoactive substance abuse, uncomplicated: Secondary | ICD-10-CM

## 2021-05-23 DIAGNOSIS — F329 Major depressive disorder, single episode, unspecified: Secondary | ICD-10-CM

## 2021-05-23 DIAGNOSIS — F17200 Nicotine dependence, unspecified, uncomplicated: Secondary | ICD-10-CM

## 2021-05-23 DIAGNOSIS — D539 Nutritional anemia, unspecified: Secondary | ICD-10-CM

## 2021-05-23 DIAGNOSIS — F1010 Alcohol abuse, uncomplicated: Secondary | ICD-10-CM

## 2021-05-23 DIAGNOSIS — G4700 Insomnia, unspecified: Secondary | ICD-10-CM

## 2021-05-23 DIAGNOSIS — J449 Chronic obstructive pulmonary disease, unspecified: Secondary | ICD-10-CM

## 2021-05-23 LAB — BASIC METABOLIC PANEL
Anion gap: 8 (ref 5–15)
BUN: 10 mg/dL (ref 8–23)
CO2: 29 mmol/L (ref 22–32)
Calcium: 8.1 mg/dL — ABNORMAL LOW (ref 8.9–10.3)
Chloride: 99 mmol/L (ref 98–111)
Creatinine, Ser: 4.84 mg/dL — ABNORMAL HIGH (ref 0.61–1.24)
GFR, Estimated: 12 mL/min — ABNORMAL LOW (ref >60)
Glucose, Bld: 79 mg/dL (ref 70–99)
Potassium: 3.7 mmol/L (ref 3.5–5.1)
Sodium: 136 mmol/L (ref 135–145)

## 2021-05-23 LAB — CBC
HCT: 28.8 % — ABNORMAL LOW (ref 39.0–52.0)
Hemoglobin: 8.5 g/dL — ABNORMAL LOW (ref 13.0–17.0)
MCH: 31.5 pg (ref 26.0–34.0)
MCHC: 29.5 g/dL — ABNORMAL LOW (ref 30.0–36.0)
MCV: 106.7 fL — ABNORMAL HIGH (ref 80.0–100.0)
Platelets: 131 10*3/uL — ABNORMAL LOW (ref 150–400)
RBC: 2.7 MIL/uL — ABNORMAL LOW (ref 4.22–5.81)
RDW: 19.3 % — ABNORMAL HIGH (ref 11.5–15.5)
WBC: 3.6 10*3/uL — ABNORMAL LOW (ref 4.0–10.5)
nRBC: 0 % (ref 0.0–0.2)

## 2021-05-23 LAB — GLUCOSE, CAPILLARY: Glucose-Capillary: 79 mg/dL (ref 70–99)

## 2021-05-23 NOTE — Progress Notes (Signed)
HD#5 Subjective:  Overnight Events: none   Patient seen and assessed at bedside today.  Patient reports that he is doing well today.  Patient denies feeling depressed.  Patient still agreeable to going to SNF for nieces as a backup with PACE day program.  Patient reports mild nausea but otherwise has no other complaints.  Patient reports improved sleep after being started on ramelteon.  Discussed plan to continue working on SNF placement with Education officer, museum.  Patient verbalized agreement understanding.  Patient had no other questions at this time.  Objective:  Vital signs in last 24 hours: Vitals:   05/22/21 1958 05/23/21 0500 05/23/21 0509 05/23/21 0900  BP: 124/69  (!) 143/85 138/86  Pulse: 73  71 75  Resp: 16  16 18   Temp: 98.5 F (36.9 C)  98.2 F (36.8 C) 98 F (36.7 C)  TempSrc: Oral  Oral Oral  SpO2: 97%  100% 100%  Weight:  66 kg    Height:       Supplemental O2: Nasal Cannula SpO2: 100 % O2 Flow Rate (L/min): 2 L/min   Physical Exam:  Physical Exam Constitutional:      Appearance: Normal appearance.  HENT:     Head: Normocephalic.  Cardiovascular:     Rate and Rhythm: Normal rate and regular rhythm.     Pulses: Normal pulses.     Heart sounds: Normal heart sounds. No murmur heard.   No friction rub. No gallop.  Pulmonary:     Effort: Pulmonary effort is normal. No respiratory distress.     Breath sounds: Normal breath sounds. No wheezing or rales.  Abdominal:     General: Abdomen is flat. Bowel sounds are normal.     Palpations: Abdomen is soft.  Skin:    General: Skin is warm and dry.  Neurological:     Mental Status: He is alert.    Filed Weights   05/22/21 0730 05/22/21 1207 05/23/21 0500  Weight: 64.8 kg 62.8 kg 66 kg     Intake/Output Summary (Last 24 hours) at 05/23/2021 1426 Last data filed at 05/23/2021 0900 Gross per 24 hour  Intake 600 ml  Output 0 ml  Net 600 ml    Net IO Since Admission: -5,390 mL [05/23/21  1426]  Pertinent Labs: CBC Latest Ref Rng & Units 05/23/2021 05/22/2021 05/21/2021  WBC 4.0 - 10.5 K/uL 3.6(L) 3.8(L) 3.4(L)  Hemoglobin 13.0 - 17.0 g/dL 8.5(L) 7.7(L) 8.7(L)  Hematocrit 39.0 - 52.0 % 28.8(L) 26.1(L) 28.1(L)  Platelets 150 - 400 K/uL 131(L) 116(L) 108(L)    CMP Latest Ref Rng & Units 05/23/2021 05/22/2021 05/21/2021  Glucose 70 - 99 mg/dL 79 74 96  BUN 8 - 23 mg/dL 10 28(H) 18  Creatinine 0.61 - 1.24 mg/dL 4.84(H) 7.88(H) 6.05(H)  Sodium 135 - 145 mmol/L 136 137 136  Potassium 3.5 - 5.1 mmol/L 3.7 4.1 3.8  Chloride 98 - 111 mmol/L 99 98 97(L)  CO2 22 - 32 mmol/L 29 30 31   Calcium 8.9 - 10.3 mg/dL 8.1(L) 8.4(L) 8.6(L)  Total Protein 6.5 - 8.1 g/dL - - -  Total Bilirubin 0.3 - 1.2 mg/dL - - -  Alkaline Phos 38 - 126 U/L - - -  AST 15 - 41 U/L - - -  ALT 0 - 44 U/L - - -    Imaging: No results found.  Assessment/Plan:   Active Problems:   ESRD (end stage renal disease) on dialysis ([place])   Uremia   Severe  episode of recurrent major depressive disorder, without psychotic features [facility])   Patient Summary: Patient is a 65 year old male with history of ESRD on hemodialysis Tuesday, Thursday, Saturday (non-compliant), medication noncompliance, COPD, hep C treated, pancytopenia, hypertension, CAD, PAD, COPD presented to the ED on 05/18/2021 presented with altered mental status and admitted for uremia. AMS likely due to missing HD. Depression may be contributory to current presentation as well.   Active Problems: ESRD (end stage renal disease) on dialysis ([place])   #ESRD on HD Patient reports being noncompliant with hemodialysis since hospitalization since 10/6. Patient reports this is due to worsening depression but may also be due to hypoxic brain injury (due to PEA arrest last hospitalization).  Patient has had increased improvement in mentation following serial HD. -BUN 213 on admission > 18 -Cr 27.19 on admission > 4.84 -Nephrology consulted. Greatly appreciate  assistance and recommendations. -Continue renal function panel monitoring -Aranesp injection 200 mcg every Tuesday during HD -Sensipar 180 mg during HD -Pending SNF placement   Insomnia -Added ramelteon 8 mg qhs   Hypertension likely 2/2 medication noncompliance -Most recent 138/86 -HD will likely help also with hypertension as well -Amlodipine 10 mg qd -Carvedilol 12.5 mg bid -We will continue to monitor blood pressures   Hyperphosphatemia -Phosphorus 9.1 on admission, now 4.9 -Continue Renvela 3200 mg qd tid   COPD -99% on 2 L [place] -Duoneb 3 mL q6 hour PRN for wheezing/SOB   MDD Patient meets criteria for major depressive disorder. -Zoloft 50 mg qd -Psych signed off -Recommend outpatient behavioral health follow-up for medication management and possibly therapy   Macrocytic Anemia likely due to chronic alcohol use -Hgb 8.7 today -Iron 47 -TIBC 248 -Negative ethanol -B9/b12 normal last hospitalization 1 month ago   Hx of Hypoglycemia PEA arrest Patient has had multiple hospitalization due to hypoglycemia.  During previous hospitalization, insulinoma and adrenal insufficiency had been ruled out.  More likely that hypoglycemia is related to ESRD and poor p.o. intake at this time. -Continue to monitor CBG   Alcohol Use Disorder Substance Use Disorder Nicotine Use Disorder -Negative alcohol -CIWA score: 2, 0, discontinued  -Nicoderm patch 14 mg for nicotine replacement therapy   Dyslipidemia -Resumed home med Crestor 10 mg daily  Diet: Renal IVF: None,None VTE: Enoxaparin Code: Full PT/OT recs: Pending, none. TOC recs: pending   Dispo: SNF placement  France Ravens, MD 05/23/2021, 2:26 PM Pager: [phone]  Please contact the on call pager after 5 pm and on weekends at [phone].

## 2021-05-23 NOTE — TOC Initial Note (Addendum)
Transition of Care [facility]) - Initial/Assessment Note    Patient Details  Name: Raymond [name] MRN: [id number] Date of Birth: July 07, 1956  [facility] CM/SW Contact:    Milas Gain, [place] Phone Number: 05/23/2021, 11:04 AM  Clinical Narrative:                  CSW received consult for possible SNF placement at time of discharge. CSW spoke with patient regarding PT recommendation of SNF placement at time of discharge. Patient reports he comes from home alone. CSW discussed possible barriers to placement with patient.Patient expressed understanding of PT recommendation and is agreeable to SNF placement at time of discharge. Patient gave CSW permission to fax out initial referral near the [place] area. CSW discussed insurance authorization process with patient.Patient reports he has received the COVID vaccines as well as three boosters. Patient reports his HD days are Tuesday, Thursday, and Saturday. CSW will follow up with renal navigator to confirm HD information. No further questions reported at this time. CSW to continue to follow and assist with discharge planning needs.   Expected Discharge Plan: Skilled Nursing Facility Barriers to Discharge: Continued Medical Work up   Patient Goals and CMS Choice Patient states their goals for this hospitalization and ongoing recovery are:: SNF CMS Medicare.gov Compare Post Acute Care list provided to:: Patient Choice offered to / list presented to : Patient  Expected Discharge Plan and Services Expected Discharge Plan: [place] In-house Referral: Clinical Social Work     Living arrangements for the past 2 months: Apartment                                      Prior Living Arrangements/Services Living arrangements for the past 2 months: Apartment Lives with:: Self Patient language and need for interpreter reviewed:: Yes Do you feel safe going back to the place where you live?: No   SNF  Need for  Family Participation in Patient Care: Yes (Comment) Care giver support system in place?: Yes (comment)   Criminal Activity/Legal Involvement Pertinent to Current Situation/Hospitalization: No - Comment as needed  Activities of Daily Living Home Assistive Devices/Equipment: Grab bars in shower, Grab bars around toilet, Walker (specify type) ADL Screening (condition at time of admission) Patient's cognitive ability adequate to safely complete daily activities?: Yes Is the patient deaf or have difficulty hearing?: Yes Does the patient have difficulty seeing, even when wearing glasses/contacts?: No Does the patient have difficulty concentrating, remembering, or making decisions?: No Patient able to express need for assistance with ADLs?: Yes Does the patient have difficulty dressing or bathing?: No Independently performs ADLs?: No Does the patient have difficulty walking or climbing stairs?: Yes Weakness of Legs: Both Weakness of Arms/Hands: Both  Permission Sought/Granted Permission sought to share information with : Case Manager, Family Supports, Chartered certified accountant granted to share information with : Yes, Verbal Permission Granted  Share Information with NAME: Olin Hauser  Permission granted to share info w AGENCY: SNF  Permission granted to share info w Relationship: sister  Permission granted to share info w Contact Information: Olin Hauser [phone]  Emotional Assessment Appearance:: Appears stated age Attitude/Demeanor/Rapport: Gracious Affect (typically observed): Calm Orientation: : Oriented to Self, Oriented to Place, Oriented to  Time, Oriented to Situation Alcohol / Substance Use: Not Applicable Psych Involvement: No (comment)  Admission diagnosis:  Hyperkalemia [E87.5] Uremia [N19] ESRD (end stage renal disease) on  dialysis ([place]) [N18.6, Z99.2] Other hypervolemia [E87.79] Patient Active Problem List   Diagnosis Date Noted   Uremia    Severe episode  of recurrent major depressive disorder, without psychotic features ([place])    ESRD (end stage renal disease) on dialysis ([place]) 05/18/2021   Cardiac arrest ([place]) 04/03/2021   Anisocoria 04/03/2021   HFrEF (heart failure with reduced ejection fraction) ([place]) 04/03/2021   Hypothermia not due to cold exposure 04/03/2021   Junctional bradycardia 04/03/2021   Pancytopenia ([place]) 01/25/2021   AMS (altered mental status) 01/19/2021   Chronic viral hepatitis C ([place]) 12/25/2020   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 ([place]) 08/05/2020   Acute upper GI bleed 62/69/4854   Acute metabolic encephalopathy 62/70/3500   COVID-19 virus infection 07/29/2020   AVM (arteriovenous malformation) of stomach, acquired with hemorrhage    AVM (arteriovenous malformation) of small bowel, acquired with hemorrhage    Current use of long term anticoagulation    Sensorineural hearing loss (SNHL) of both ears 03/30/2020   History of arteriovenous malformation (AVM) 01/14/2020   UGIB (upper gastrointestinal bleed) 01/14/2020   Acute blood loss anemia 01/14/2020   Elevated troponin 01/14/2020   Allergy, unspecified, initial encounter 04/29/2019   Gastroenteritis 12/24/2018   Substance use disorder 12/24/2018   Type 2 diabetes mellitus with other diabetic kidney complication ([place]) 93/81/8299   Noncompliance of patient with renal dialysis ([place]) 09/12/2018   Benign neoplasm of colon    Melena    Anemia due to chronic kidney disease, on chronic dialysis ([place])    Anemia    H/O medication noncompliance    Polysubstance (excluding opioids) dependence ([place])    Atrial flutter with rapid ventricular response ([place]) 08/10/2018   PAF (paroxysmal atrial fibrillation) ([place]) 08/01/2018   Acute respiratory failure with hypoxia ([place]) 07/13/2018   Non-compliance with renal dialysis ([place]) 07/13/2018   PAD (peripheral artery disease) ([place]) 04/11/2018   Sepsis ([place]) 03/31/2018   Preop cardiovascular exam 03/29/2018    Idiopathic gout, unspecified site 12/13/2017   Other specified complication of vascular prosthetic devices, implants and grafts, initial encounter ([place]) 11/03/2017   Volume overload 08/07/2017   Acute pulmonary edema (HCC)    Dyspnea 06/20/2017   Acute bronchitis    COPD (chronic obstructive pulmonary disease) ([place])    ESRD on hemodialysis ([place])    Hypoxia 12/05/2016   Hyperkalemia 12/19/2015   Hypoglycemia 12/19/2015   Polysubstance abuse ([place]) 12/19/2015   Homeless 12/19/2015   Anemia associated with stage 5 chronic renal failure ([place]) 12/19/2015   BPH without urinary obstruction 10/29/2015   Erectile dysfunction 10/29/2015   Hematuria, gross 10/29/2015   Hypercalcemia 11/07/2014   Pruritus, unspecified 10/17/2014   Coagulation defect, unspecified ([place]) 09/17/2014   Atypical chest pain 12/19/2013   Thrombocytopenia, unspecified ([place]) 10/17/2013   Iron deficiency anemia, unspecified 08/28/2013   Atherosclerosis of native arteries of extremity with intermittent claudication ([place]) 12/04/2012   ESRD (end stage renal disease) ([place]) 12/04/2012   Hypertensive chronic kidney disease with stage 5 chronic kidney disease or end stage renal disease ([place]) 11/20/2012   Secondary hyperparathyroidism of renal origin ([place]) 11/20/2012   HEPATITIS C 09/07/2006   HYPERCHOLESTEROLEMIA 09/07/2006   HYPERTENSION, BENIGN SYSTEMIC 09/07/2006   PCP:  [name], FNP Pharmacy:   [id number] - [place], [place] [address] [place] [address] Phone: [phone] Fax: [phone]     Social Determinants of Health (SDOH) Interventions  Readmission Risk Interventions Readmission Risk Prevention Plan 06/22/2020  Transportation Screening Complete  Medication Review Press photographer) Complete  PCP or Specialist appointment within 3-5 days of discharge Complete  HRI or [place] Complete  SW Recovery  Care/Counseling Consult Complete  [employer] Patient Refused  Some recent data might be hidden

## 2021-05-23 NOTE — [place] FL2 (Signed)
[place] MEDICAID FL2 LEVEL OF CARE SCREENING TOOL     IDENTIFICATION  Patient Name: Raymond [name] Birthdate: Apr 18, 1956 Sex: male Admission Date (Current Location): 05/18/2021  [facility] and [place] Number:  Herbalist and Address:  [facility] [place] [place] [address] [place], [place] [address]      Provider Number: [id number]  Attending Physician Name and Address:  Axel Filler, *  Relative Name and Phone Number:  Olin Hauser [phone]    Current Level of Care: Hospital Recommended Level of Care: [place] Prior Approval Number:    Date Approved/Denied:   PASRR Number: [id number]  Discharge Plan: SNF    Current Diagnoses: Patient Active Problem List   Diagnosis Date Noted   Uremia    Severe episode of recurrent major depressive disorder, without psychotic features ([place])    ESRD (end stage renal disease) on dialysis ([place]) 05/18/2021   Cardiac arrest ([place]) 04/03/2021   Anisocoria 04/03/2021   HFrEF (heart failure with reduced ejection fraction) ([place]) 04/03/2021   Hypothermia not due to cold exposure 04/03/2021   Junctional bradycardia 04/03/2021   Pancytopenia ([place]) 01/25/2021   AMS (altered mental status) 01/19/2021   Chronic viral hepatitis C ([place]) 12/25/2020   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 ([place]) 08/05/2020   Acute upper GI bleed 09/98/3382   Acute metabolic encephalopathy 50/53/9767   COVID-19 virus infection 07/29/2020   AVM (arteriovenous malformation) of stomach, acquired with hemorrhage    AVM (arteriovenous malformation) of small bowel, acquired with hemorrhage    Current use of long term anticoagulation    Sensorineural hearing loss (SNHL) of both ears 03/30/2020   History of arteriovenous malformation (AVM) 01/14/2020   UGIB (upper gastrointestinal bleed) 01/14/2020   Acute blood loss anemia 01/14/2020   Elevated troponin 01/14/2020   Allergy, unspecified, initial encounter  04/29/2019   Gastroenteritis 12/24/2018   Substance use disorder 12/24/2018   Type 2 diabetes mellitus with other diabetic kidney complication ([place]) 34/19/3790   Noncompliance of patient with renal dialysis ([place]) 09/12/2018   Benign neoplasm of colon    Melena    Anemia due to chronic kidney disease, on chronic dialysis ([place])    Anemia    H/O medication noncompliance    Polysubstance (excluding opioids) dependence ([place])    Atrial flutter with rapid ventricular response ([place]) 08/10/2018   PAF (paroxysmal atrial fibrillation) ([place]) 08/01/2018   Acute respiratory failure with hypoxia ([place]) 07/13/2018   Non-compliance with renal dialysis ([place]) 07/13/2018   PAD (peripheral artery disease) ([place]) 04/11/2018   Sepsis ([place]) 03/31/2018   Preop cardiovascular exam 03/29/2018   Idiopathic gout, unspecified site 12/13/2017   Other specified complication of vascular prosthetic devices, implants and grafts, initial encounter ([place]) 11/03/2017   Volume overload 08/07/2017   Acute pulmonary edema (HCC)    Dyspnea 06/20/2017   Acute bronchitis    COPD (chronic obstructive pulmonary disease) ([place])    ESRD on hemodialysis ([place])    Hypoxia 12/05/2016   Hyperkalemia 12/19/2015   Hypoglycemia 12/19/2015   Polysubstance abuse ([place]) 12/19/2015   Homeless 12/19/2015   Anemia associated with stage 5 chronic renal failure ([place]) 12/19/2015   BPH without urinary obstruction 10/29/2015   Erectile dysfunction 10/29/2015   Hematuria, gross 10/29/2015   Hypercalcemia 11/07/2014   Pruritus, unspecified 10/17/2014   Coagulation defect, unspecified ([place]) 09/17/2014   Atypical chest pain 12/19/2013   Thrombocytopenia, unspecified ([place]) 10/17/2013   Iron deficiency anemia, unspecified 08/28/2013   Atherosclerosis of  native arteries of extremity with intermittent claudication ([place]) 12/04/2012   ESRD (end stage renal disease) ([place]) 12/04/2012   Hypertensive chronic kidney disease with stage 5 chronic kidney disease  or end stage renal disease (HCC) 11/20/2012   Secondary hyperparathyroidism of renal origin ([place]) 11/20/2012   HEPATITIS C 09/07/2006   HYPERCHOLESTEROLEMIA 09/07/2006   HYPERTENSION, BENIGN SYSTEMIC 09/07/2006    Orientation RESPIRATION BLADDER Height & Weight     Self, Time, Situation, Place  Normal Continent Weight: 145 lb 8.1 oz (66 kg) Height:  5\' 8"  (172.7 cm)  BEHAVIORAL SYMPTOMS/MOOD NEUROLOGICAL BOWEL NUTRITION STATUS      Continent Diet (Please see discharge summary)  AMBULATORY STATUS COMMUNICATION OF NEEDS Skin   Limited Assist Verbally Other (Comment) (appropriate for ethnicity,dry, abrasion,knee, right)                       Personal Care Assistance Level of Assistance  Bathing, Feeding, Dressing Bathing Assistance: Limited assistance Feeding assistance: Independent Dressing Assistance: Limited assistance     Functional Limitations Info  Sight, Hearing, Speech Sight Info: Impaired Hearing Info: Adequate Speech Info: Adequate    SPECIAL CARE FACTORS FREQUENCY  PT (By licensed PT), OT (By licensed OT)     PT Frequency: 5x min weekly OT Frequency: 5x min weekly            Contractures Contractures Info: Not present    Additional Factors Info  Code Status, Allergies, Psychotropic Code Status Info: FULL Allergies Info: No known allergies Psychotropic Info: sertraline (ZOLOFT) tablet 50 mg daily         Current Medications (05/23/2021):  This is the current hospital active medication list Current Facility-Administered Medications  Medication Dose Route Frequency Provider Last Rate Last Admin   0.9 %  sodium chloride infusion  100 mL Intravenous PRN Gaylan Gerold, DO       0.9 %  sodium chloride infusion  100 mL Intravenous PRN Gaylan Gerold, DO       acetaminophen (TYLENOL) tablet 650 mg  650 mg Oral Q6H PRN Gaylan Gerold, DO   650 mg at 05/21/21 1410   Or   acetaminophen (TYLENOL) suppository 650 mg  650 mg Rectal Q6H PRN Gaylan Gerold, DO        amLODipine (NORVASC) tablet 10 mg  10 mg Oral Daily France Ravens, MD   10 mg at 05/23/21 0867   bismuth subsalicylate (PEPTO BISMOL) 262 MG/15ML suspension 30 mL  30 mL Oral Q4H PRN France Ravens, MD   30 mL at 05/22/21 1805   carvedilol (COREG) tablet 6.25 mg  6.25 mg Oral BID WC Valentina Gu, NP   6.25 mg at 05/23/21 6195   Chlorhexidine Gluconate Cloth 2 % PADS 6 each  6 each Topical Q0600 Gaylan Gerold, DO   6 each at 05/23/21 0506   Chlorhexidine Gluconate Cloth 2 % PADS 6 each  6 each Topical Q0600 Janalee Dane, PA-C   6 each at 05/23/21 0506   cinacalcet (SENSIPAR) tablet 180 mg  180 mg Oral Q T,Th,Sa-HD Gaylan Gerold, DO   180 mg at 05/22/21 0932   Darbepoetin Alfa (ARANESP) injection 200 mcg  200 mcg Intravenous Q Danne Baxter, DO   200 mcg at 05/18/21 1859   feeding supplement (NEPRO CARB STEADY) liquid 237 mL  237 mL Oral BID BM Gaylan Gerold, DO 0 mL/hr at 05/19/21 0600 237 mL at 67/12/45 8099   folic acid (FOLVITE) tablet 1 mg  1 mg  Oral Daily Gaylan Gerold, DO   1 mg at 05/23/21 2500   heparin injection 5,000 Units  5,000 Units Subcutaneous Q8H Gaylan Gerold, DO   5,000 Units at 05/23/21 0505   ipratropium-albuterol (DUONEB) 0.5-2.5 (3) MG/3ML nebulizer solution 3 mL  3 mL Nebulization Q6H PRN Gaylan Gerold, DO       lidocaine (PF) (XYLOCAINE) 1 % injection 5 mL  5 mL Intradermal PRN Gaylan Gerold, DO       lidocaine-prilocaine (EMLA) cream 1 application  1 application Topical PRN Gaylan Gerold, DO       melatonin tablet 3 mg  3 mg Oral QHS PRN Wayland Denis, MD   3 mg at 05/22/21 2133   multivitamin (RENA-VIT) tablet 1 tablet  1 tablet Oral QHS Gaylan Gerold, DO   1 tablet at 05/22/21 2134   nicotine (NICODERM CQ - dosed in mg/24 hours) patch 14 mg  14 mg Transdermal Daily France Ravens, MD   14 mg at 05/23/21 0835   ondansetron (ZOFRAN) injection 4 mg  4 mg Intravenous Q8H PRN Wayland Denis, MD   4 mg at 05/21/21 2323   pentafluoroprop-tetrafluoroeth (GEBAUERS) aerosol 1  application  1 application Topical PRN Gaylan Gerold, DO       polyethylene glycol (MIRALAX / GLYCOLAX) packet 17 g  17 g Oral Daily PRN Gaylan Gerold, DO       ramelteon (ROZEREM) tablet 8 mg  8 mg Oral QHS France Ravens, MD   8 mg at 05/22/21 2133   rosuvastatin (CRESTOR) tablet 10 mg  10 mg Oral Daily Gaylan Gerold, DO   10 mg at 05/23/21 3704   sertraline (ZOLOFT) tablet 50 mg  50 mg Oral Daily France Ravens, MD   50 mg at 05/23/21 8889   sevelamer carbonate (RENVELA) tablet 3,200 mg  3,200 mg Oral TID WC Gaylan Gerold, DO   3,200 mg at 05/23/21 1694   thiamine tablet 100 mg  100 mg Oral Daily Gaylan Gerold, DO   100 mg at 05/23/21 5038   Facility-Administered Medications Ordered in Other Encounters  Medication Dose Route Frequency Provider Last Rate Last Admin   midazolam (VERSED) 5 MG/5ML injection    Anesthesia Intra-op Sammie Bench, CRNA   2 mg at 04/11/18 1042     Discharge Medications: Please see discharge summary for a list of discharge medications.  Relevant Imaging Results:  Relevant Lab Results:   Additional Information [phone], Both Covid Vaccines, 3 boosters  Milas Gain, LCSWA

## 2021-05-23 NOTE — Progress Notes (Signed)
[facility] Progress Note   Subjective:   Pt seen in room. Tolerated HD yesterday without issues. No concerns today, denies SOB, CP, palpitations and dizziness. Slept better last night.   Objective Vitals:   05/22/21 1648 05/22/21 1958 05/23/21 0500 05/23/21 0509  BP: 138/77 124/69  (!) 143/85  Pulse: 72 73  71  Resp: 18 16  16   Temp: 98.9 F (37.2 C) 98.5 F (36.9 C)  98.2 F (36.8 C)  TempSrc: Oral Oral  Oral  SpO2: 95% 97%  100%  Weight:   66 kg   Height:       Physical Exam General: WDWN male, alert and in NAD Heart: RRR, no murmur Lungs: CTA bilaterally Abdomen: Soft, non-tender, non-distended, +BS Extremities: No edema b/l lower extremities Dialysis Access: LUE AVF +t/b    Additional Objective Labs: Basic Metabolic Panel: Recent Labs  Lab 05/19/21 0503 05/20/21 0616 05/21/21 0704 05/22/21 0351  NA 140 137 136 137  K 5.0 4.5 3.8 4.1  CL 100 100 97* 98  CO2 23 26 31 30   GLUCOSE 79 87 96 74  BUN 119* 54* 18 28*  CREATININE 18.04* 10.70* 6.05* 7.88*  CALCIUM 9.0 9.3 8.6* 8.4*  PHOS 6.6* 5.7* 4.9*  --    Liver Function Tests: Recent Labs  Lab 05/18/21 1209 05/18/21 1635 05/19/21 0503 05/20/21 0616 05/21/21 0704  AST <5*  --   --   --   --   ALT 9  --   --   --   --   ALKPHOS 62  --   --   --   --   BILITOT 0.9  --   --   --   --   PROT 7.3  --   --   --   --   ALBUMIN 3.3*   < > 2.7* 2.7* 2.8*   < > = values in this interval not displayed.   No results for input(s): LIPASE, AMYLASE in the last 168 hours. CBC: Recent Labs  Lab 05/18/21 1209 05/19/21 0503 05/20/21 0616 05/21/21 0704 05/22/21 0351  WBC 3.2* 2.3* 3.4* 3.4* 3.8*  NEUTROABS 2.1  --   --   --   --   HGB 8.0* 7.0* 7.5* 8.7* 7.7*  HCT 25.3* 21.7* 24.7* 28.1* 26.1*  MCV 103.3* 100.5* 104.2* 106.0* 107.9*  PLT 126* 101* 103* 108* 116*   Blood Culture    Component Value Date/Time   SDES BLOOD RIGHT HAND 04/13/2021 0600   SPECREQUEST  04/13/2021 0600    BOTTLES  DRAWN AEROBIC AND ANAEROBIC Blood Culture adequate volume   CULT  04/13/2021 0600    NO GROWTH 5 DAYS Performed at [facility], [place] [address] [place], [place] [address]    REPTSTATUS 04/18/2021 FINAL 04/13/2021 0600    Cardiac Enzymes: No results for input(s): CKTOTAL, CKMB, CKMBINDEX, TROPONINI in the last 168 hours. CBG: Recent Labs  Lab 05/18/21 2304 05/19/21 0557 05/20/21 0633 05/21/21 0658 05/23/21 0549  GLUCAP 113* 108* 82 78 79   Iron Studies: No results for input(s): IRON, TIBC, TRANSFERRIN, FERRITIN in the last 72 hours. @lablastinr3 @ Studies/Results: No results found. Medications:  sodium chloride     sodium chloride      amLODipine  10 mg Oral Daily   carvedilol  6.25 mg Oral BID WC   Chlorhexidine Gluconate Cloth  6 each Topical Q0600   Chlorhexidine Gluconate Cloth  6 each Topical Q0600   cinacalcet  180 mg Oral Q  T,Th,Sa-HD   darbepoetin (ARANESP) injection - DIALYSIS  200 mcg Intravenous Q Tue-HD   feeding supplement (NEPRO CARB STEADY)  237 mL Oral BID BM   folic acid  1 mg Oral Daily   heparin injection (subcutaneous)  5,000 Units Subcutaneous Q8H   multivitamin  1 tablet Oral QHS   nicotine  14 mg Transdermal Daily   ramelteon  8 mg Oral QHS   rosuvastatin  10 mg Oral Daily   sertraline  50 mg Oral Daily   sevelamer carbonate  3,200 mg Oral TID WC   thiamine  100 mg Oral Daily    Dialysis Orders: [facility] T,Th,S 4 hrs 180NRe 400/Autoflow 1.5 68.8 kg 2.0K/2.0 Ca UFP 4 AVF -No Heparin -Cinacalcet 180 mg PO TIW -Mircera 200 mcg IV q 2 weeks (last dose 04/29/2021) -Calcitriol 2.75 mcg PO TIW    Assessment/Plan: Lethargy/AMS:  Uremia D/T noncompliance with HD. SCr 27.2 BUN>200.  Resolved with serial HD. Still some nausea but expect this will continue to improve.  Hyperkalemia: In setting of missed HD.Resolved with serial HD   ESRD -  T,Th,S Had not been to treatment since 05/01/2021. HD x3, now back on schedule.  Hypertension/volume  -  Resumed home meds. Replaced Metoprolol with coreg due to h/o cocaine abuse.  UF as tolerated.   Anemia  - HGB 7.7, has been in the 7-8 range. No ESA since 04/29/21. Given Aranesp 200 mcg IV 11/8. Iron sat 19%  Loading with Fe.   Metabolic bone disease -  C Ca was elevated, now improving. Hold VDRA. PO4 elevated. Resume senispar and binders (Renvela)  Nutrition - Albumin 2.8, on  Renal diet, nepro  Tobacco/polysubstance abuse-per primary Social issues: Long standing issues with polysubstance abuse, homelessness. Consulted MSW. Pt wants to continue HD and is interested in SNF if he qualifies  Anice Paganini, PA-C 05/23/2021, 6:21 AM  [facility] Pager: [phone]

## 2021-05-24 ENCOUNTER — Inpatient Hospital Stay (HOSPITAL_COMMUNITY)
Admission: RE | Admit: 2021-05-24 | Discharge: 2021-05-24 | Disposition: A | Discharge status: Home / Self Care | Payer: 59 | Source: Ambulatory Visit | Attending: Physician Assistant | Admitting: Physician Assistant

## 2021-05-24 ENCOUNTER — Ambulatory Visit: Payer: 59

## 2021-05-24 LAB — RENAL FUNCTION PANEL
Albumin: 2.7 g/dL — ABNORMAL LOW (ref 3.5–5.0)
Anion gap: 8 (ref 5–15)
BUN: 25 mg/dL — ABNORMAL HIGH (ref 8–23)
CO2: 30 mmol/L (ref 22–32)
Calcium: 8 mg/dL — ABNORMAL LOW (ref 8.9–10.3)
Chloride: 96 mmol/L — ABNORMAL LOW (ref 98–111)
Creatinine, Ser: 7.52 mg/dL — ABNORMAL HIGH (ref 0.61–1.24)
GFR, Estimated: 7 mL/min — ABNORMAL LOW (ref >60)
Glucose, Bld: 74 mg/dL (ref 70–99)
Phosphorus: 3.6 mg/dL (ref 2.5–4.6)
Potassium: 4 mmol/L (ref 3.5–5.1)
Sodium: 134 mmol/L — ABNORMAL LOW (ref 135–145)

## 2021-05-24 MED ORDER — CHLORHEXIDINE GLUCONATE CLOTH 2 % EX PADS: 6.0000 | MEDICATED_PAD | Freq: Every day | CUTANEOUS | Status: DC

## 2021-05-24 NOTE — Progress Notes (Signed)
Physical Therapy Treatment Patient Details Name: Raymond [name] MRN: [id number] DOB: Mar 16, 1956 Today's Date: 05/24/2021   History of Present Illness 65 y.o. male [facility] hospital [facility]/18/2021 with AMS. Pt with 3 admissions since June 2022 for AMS and hypoglycemia, with PEA arrest during September admission. Pt reports worsening depression. PMH includes ESRD on HDU TTS, COPD, hep C, HTN, CAD, PAD, COPD.    PT Comments    Pt is progressing towards his physical therapy goals, demonstrating improved strength and ambulation distance. Pt ambulating 40 feet with a walker at a min assist level. Presents as a high fall risk based on decreased gait speed and safety awareness. Pt continues with decreased cognition, impaired weakness, decreased coordination and endurance. In light of deficits and decreased caregiver assist, recommend SNF at discharge.     Recommendations for follow up therapy are one component of a multi-disciplinary discharge planning process, led by the attending physician.  Recommendations may be updated based on patient status, additional functional criteria and insurance authorization.  Follow Up Recommendations  Skilled nursing-short term rehab (<3 hours/day)     Assistance Recommended at Discharge Intermittent Supervision/Assistance  Equipment Recommendations  Rolling walker (2 wheels);BSC/3in1    Recommendations for Other Services       Precautions / Restrictions Precautions Precautions: Fall Restrictions Weight Bearing Restrictions: No     Mobility  Bed Mobility Overal bed mobility: Modified Independent                  Transfers Overall transfer level: Needs assistance Equipment used: None Transfers: Sit to/from Stand             General transfer comment: MinA to rise and steady    Ambulation/Gait Ambulation/Gait assistance: Min assist Gait Distance (Feet): 40 Feet Assistive device: Rolling walker (2 wheels) Gait Pattern/deviations:  Step-through pattern;Decreased stride length;Decreased dorsiflexion - right;Decreased dorsiflexion - left Gait velocity: decreased Gait velocity interpretation: <1.8 ft/sec, indicate of risk for recurrent falls   General Gait Details: Cues for walker proximity and use, decreased bilateral heel strike at initial contact, dynamic instability requiring minA for balance   Stairs             Wheelchair Mobility    Modified Rankin (Stroke Patients Only)       Balance Overall balance assessment: Needs assistance Sitting-balance support: Feet supported Sitting balance-Leahy Scale: Fair     Standing balance support: Bilateral upper extremity supported Standing balance-Leahy Scale: Poor Standing balance comment: heavy use of arms on RW                            Cognition Arousal/Alertness: Awake/alert Behavior During Therapy: Flat affect Overall Cognitive Status: No family/caregiver present to determine baseline cognitive functioning                                 General Comments: pt with slowed processing, emerging awareness of deficits        Exercises General Exercises - Lower Extremity Long Arc Quad: Both;15 reps;Seated (with resistance)    General Comments        Pertinent Vitals/Pain Pain Assessment: No/denies pain    Home Living                          Prior Function            PT  Goals (current goals can now be found in the care plan section) Acute Rehab PT Goals Patient Stated Goal: pt still agreeable to rehab PT Goal Formulation: With patient Time For Goal Achievement: 06/02/21 Potential to Achieve Goals: Fair Progress towards PT goals: Progressing toward goals    Frequency    Min 2X/week      PT Plan Current plan remains appropriate    Co-evaluation              AM-PAC PT "6 Clicks" Mobility   Outcome Measure  Help needed turning from your back to your side while in a flat bed without  using bedrails?: None Help needed moving from lying on your back to sitting on the side of a flat bed without using bedrails?: None Help needed moving to and from a bed to a chair (including a wheelchair)?: A Little Help needed standing up from a chair using your arms (e.g., wheelchair or bedside chair)?: A Little Help needed to walk in hospital room?: A Little Help needed climbing 3-5 steps with a railing? : Total 6 Click Score: 18    End of Session   Activity Tolerance: Patient tolerated treatment well Patient left: with call bell/phone within reach;in bed;with bed alarm set;Other (comment) (per pt preference) Nurse Communication: Mobility status PT Visit Diagnosis: Other abnormalities of gait and mobility (R26.89);Muscle weakness (generalized) (M62.81)     Time: 4314-2767 PT Time Calculation (min) (ACUTE ONLY): 21 min  Charges:  $Gait Training: 8-22 mins                     Wyona Almas, PT, DPT Acute Rehabilitation Services Pager [phone] Office [phone]    Deno Etienne 05/24/2021, 3:53 PM

## 2021-05-24 NOTE — Progress Notes (Addendum)
HD#6 Subjective:  Overnight Events: none   Patient seen and assessed at bedside today.  Patient reports that he is doing well today.  Patient denies feeling depressed.  Patient still agreeable to going to SNF for nieces as a backup with PACE day program.  Patient reports poor appetite still.  Encourage patient to have p.o. intake in order to build the strength back up.  Patient verbalized understanding agreement.  Discussed plan to continue working on SNF placement with Education officer, museum.  Patient verbalized agreement understanding.  Patient had no other questions at this time.  Objective:  Vital signs in last 24 hours: Vitals:   05/23/21 1933 05/24/21 0509 05/24/21 0847 05/24/21 0911  BP: 131/80 134/86 124/75 (!) 142/77  Pulse: 72 74 69 70  Resp: 18 18  18   Temp: 98.2 F (36.8 C) 98.8 F (37.1 C)  98.6 F (37 C)  TempSrc:  Oral  Oral  SpO2: 98% 99%  100%  Weight:  65.4 kg    Height:       Supplemental O2: Nasal Cannula SpO2: 100 % O2 Flow Rate (L/min): 2 L/min   Physical Exam:  Physical Exam Constitutional:      Appearance: Normal appearance.  HENT:     Head: Normocephalic.  Cardiovascular:     Rate and Rhythm: Normal rate and regular rhythm.     Pulses: Normal pulses.     Heart sounds: Normal heart sounds. No murmur heard.   No friction rub. No gallop.  Pulmonary:     Effort: Pulmonary effort is normal. No respiratory distress.     Breath sounds: Normal breath sounds. No wheezing or rales.  Abdominal:     General: Abdomen is flat. Bowel sounds are normal.     Palpations: Abdomen is soft.  Skin:    General: Skin is warm and dry.  Neurological:     Mental Status: He is alert.    Filed Weights   05/22/21 1207 05/23/21 0500 05/24/21 0509  Weight: 62.8 kg 66 kg 65.4 kg     Intake/Output Summary (Last 24 hours) at 05/24/2021 1244 Last data filed at 05/24/2021 0900 Gross per 24 hour  Intake 358 ml  Output 0 ml  Net 358 ml    Net IO Since Admission:  -5,032 mL [05/24/21 1244]  Pertinent Labs: CBC Latest Ref Rng & Units 05/23/2021 05/22/2021 05/21/2021  WBC 4.0 - 10.5 K/uL 3.6(L) 3.8(L) 3.4(L)  Hemoglobin 13.0 - 17.0 g/dL 8.5(L) 7.7(L) 8.7(L)  Hematocrit 39.0 - 52.0 % 28.8(L) 26.1(L) 28.1(L)  Platelets 150 - 400 K/uL 131(L) 116(L) 108(L)    CMP Latest Ref Rng & Units 05/24/2021 05/23/2021 05/22/2021  Glucose 70 - 99 mg/dL 74 79 74  BUN 8 - 23 mg/dL 25(H) 10 28(H)  Creatinine 0.61 - 1.24 mg/dL 7.52(H) 4.84(H) 7.88(H)  Sodium 135 - 145 mmol/L 134(L) 136 137  Potassium 3.5 - 5.1 mmol/L 4.0 3.7 4.1  Chloride 98 - 111 mmol/L 96(L) 99 98  CO2 22 - 32 mmol/L 30 29 30   Calcium 8.9 - 10.3 mg/dL 8.0(L) 8.1(L) 8.4(L)  Total Protein 6.5 - 8.1 g/dL - - -  Total Bilirubin 0.3 - 1.2 mg/dL - - -  Alkaline Phos 38 - 126 U/L - - -  AST 15 - 41 U/L - - -  ALT 0 - 44 U/L - - -    Imaging: No results found.  Assessment/Plan:   Active Problems:   ESRD (end stage renal disease) on dialysis ([place])  Uremia   Severe episode of recurrent major depressive disorder, without psychotic features [facility])   Patient Summary: Patient is a 65 year old male with history of ESRD on hemodialysis Tuesday, Thursday, Saturday (non-compliant), medication noncompliance, COPD, hep C treated, pancytopenia, hypertension, CAD, PAD, COPD presented to the ED on 05/18/2021 presented with altered mental status and admitted for uremia. AMS likely due to missing HD. Depression may be contributory to current presentation as well.   Active Problems: ESRD (end stage renal disease) on dialysis ([place])   #ESRD on HD Patient reports being noncompliant with hemodialysis since hospitalization since 10/6. Patient reports this is due to worsening depression but may also be due to hypoxic brain injury (due to PEA arrest last hospitalization).  Patient has had increased improvement in mentation following serial HD.  Patient is medically stable for discharge awaiting SNF placement. -BUN  213 on admission > 25 -Nephrology consulted. Greatly appreciate assistance and recommendations. -Continue renal function panel monitoring -Aranesp injection 200 mcg every Tuesday during HD -Sensipar 180 mg during HD -Pending SNF placement   Insomnia -Ramelteon 8 mg qhs   Hypertension likely 2/2 medication noncompliance -Most recent 138/86 -HD will likely help also with hypertension as well -Amlodipine 10 mg qd -Carvedilol 12.5 mg bid -We will continue to monitor blood pressures   Hyperphosphatemia -Phosphorus 9.1 on admission, now 4.9 -Continue Renvela 3200 mg qd tid   COPD -99% on 2 L  -Duoneb 3 mL q6 hour PRN for wheezing/SOB   MDD Patient meets criteria for major depressive disorder. -Zoloft 50 mg qd -Could consider transitioning to Remeron should patient continue to have poor p.o. intake -Psych signed off -Recommend outpatient behavioral health follow-up for medication management and possibly therapy   Macrocytic Anemia likely due to chronic alcohol use -Hgb 8.7 today -Iron 47 -TIBC 248 -Negative ethanol -B9/b12 normal last hospitalization 1 month ago   Hx of Hypoglycemia PEA arrest Patient has had multiple hospitalization due to hypoglycemia.  During previous hospitalization, insulinoma and adrenal insufficiency had been ruled out.  More likely that hypoglycemia is related to ESRD and poor p.o. intake at this time. -Continue to monitor CBG   Alcohol Use Disorder Substance Use Disorder Nicotine Use Disorder -Negative alcohol level -CIWA score: 2, 0, discontinued  -Nicoderm patch 14 mg for nicotine replacement therapy   Dyslipidemia -Resumed home med Crestor 10 mg daily  Diet: Renal IVF: None,None VTE: Enoxaparin Code: Full PT/OT recs: Pending, none. TOC recs: pending   Dispo: SNF placement  France Ravens, MD 05/24/2021, 12:44 PM Pager: [phone]  Please contact the on call pager after 5 pm and on weekends at [phone].

## 2021-05-24 NOTE — Progress Notes (Signed)
[facility] Progress Note   Subjective:  Pt seen this morning resting comfortably in the bed in no acute distress. He reports some nausea this morning. He denies CP, SHOB, and abdominal pain.   Objective Vitals:   05/23/21 1933 05/24/21 0509 05/24/21 0847 05/24/21 0911  BP: 131/80 134/86 124/75 (!) 142/77  Pulse: 72 74 69 70  Resp: 18 18  18   Temp: 98.2 F (36.8 C) 98.8 F (37.1 C)  98.6 F (37 C)  TempSrc:  Oral  Oral  SpO2: 98% 99%  100%  Weight:  65.4 kg    Height:       Physical Exam General: alert and in NAD Heart: RRR, no murmur, rubs or gallop Lungs: CTA bilaterally Abdomen: soft, non-tender, non-distended, BS present Extremities: no edema bilateral LE Dialysis Access: LUE AVF +t/b   Filed Weights   05/22/21 1207 05/23/21 0500 05/24/21 0509  Weight: 62.8 kg 66 kg 65.4 kg    Intake/Output Summary (Last 24 hours) at 05/24/2021 1228 Last data filed at 05/24/2021 0900 Gross per 24 hour  Intake 358 ml  Output 0 ml  Net 358 ml    Additional Objective Labs: Basic Metabolic Panel: Recent Labs  Lab 05/20/21 0616 05/21/21 0704 05/22/21 0351 05/23/21 0545 05/24/21 0738  NA 137 136 137 136 134*  K 4.5 3.8 4.1 3.7 4.0  CL 100 97* 98 99 96*  CO2 26 31 30 29 30   GLUCOSE 87 96 74 79 74  BUN 54* 18 28* 10 25*  CREATININE 10.70* 6.05* 7.88* 4.84* 7.52*  CALCIUM 9.3 8.6* 8.4* 8.1* 8.0*  PHOS 5.7* 4.9*  --   --  3.6   Liver Function Tests: Recent Labs  Lab 05/18/21 1209 05/18/21 1635 05/20/21 0616 05/21/21 0704 05/24/21 0738  AST <5*  --   --   --   --   ALT 9  --   --   --   --   ALKPHOS 62  --   --   --   --   BILITOT 0.9  --   --   --   --   PROT 7.3  --   --   --   --   ALBUMIN 3.3*   < > 2.7* 2.8* 2.7*   < > = values in this interval not displayed.   CBC: Recent Labs  Lab 05/18/21 1209 05/19/21 0503 05/20/21 0616 05/21/21 0704 05/22/21 0351 05/23/21 0545  WBC 3.2* 2.3* 3.4* 3.4* 3.8* 3.6*  NEUTROABS 2.1  --   --   --    --   --   HGB 8.0* 7.0* 7.5* 8.7* 7.7* 8.5*  HCT 25.3* 21.7* 24.7* 28.1* 26.1* 28.8*  MCV 103.3* 100.5* 104.2* 106.0* 107.9* 106.7*  PLT 126* 101* 103* 108* 116* 131*   Blood Culture    Component Value Date/Time   SDES BLOOD RIGHT HAND 04/13/2021 0600   SPECREQUEST  04/13/2021 0600    BOTTLES DRAWN AEROBIC AND ANAEROBIC Blood Culture adequate volume   CULT  04/13/2021 0600    NO GROWTH 5 DAYS Performed at [facility], [place] [address], [place], [place] [address]    REPTSTATUS 04/18/2021 FINAL 04/13/2021 0600    CBG: Recent Labs  Lab 05/18/21 2304 05/19/21 0557 05/20/21 0633 05/21/21 0658 05/23/21 0549  GLUCAP 113* 108* 82 78 79    Lab Results  Component Value Date   INR 1.5 (H) 07/29/2020   INR 1.5 (H) 07/28/2020   INR 1.23 08/19/2018  Medications:  sodium chloride     sodium chloride      amLODipine  10 mg Oral Daily   carvedilol  6.25 mg Oral BID WC   Chlorhexidine Gluconate Cloth  6 each Topical Q0600   cinacalcet  180 mg Oral Q T,Th,Sa-HD   darbepoetin (ARANESP) injection - DIALYSIS  200 mcg Intravenous Q Tue-HD   feeding supplement (NEPRO CARB STEADY)  237 mL Oral BID BM   folic acid  1 mg Oral Daily   heparin injection (subcutaneous)  5,000 Units Subcutaneous Q8H   multivitamin  1 tablet Oral QHS   nicotine  14 mg Transdermal Daily   ramelteon  8 mg Oral QHS   rosuvastatin  10 mg Oral Daily   sertraline  50 mg Oral Daily   sevelamer carbonate  3,200 mg Oral TID WC   thiamine  100 mg Oral Daily    Dialysis Orders: [facility] T,Th,S 4 hrs 180NRe 400/Autoflow 1.5 68.8 kg 2.0K/2.0 Ca UFP 4 AVF -No Heparin -Cinacalcet 180 mg PO TIW -Mircera 200 mcg IV q 2 weeks (last dose 04/29/2021) -Calcitriol 2.75 mcg PO TIW  Assessment/Plan: 1. Lethargy/AMS- uremia improved with HD. Still reports some nausea but expect this will continue to improve 2. Hyperkalemia- resolved with HD.  3. ESRD- TTS, no treatment since 05/01/21. Last HD during admission 11/12. Next  HD tomorrow 11/15.  4. Anemia of CKD- Hgb 8.5 (baseline 7-8), No ESA since 04/29/21. Aranesp given 11/8.   5. HTN/volume- Continue home meds. Continue Coreg. UF as tolerated  6. Metabolic bone disease- Ca decreased. Restart Calcitrol. Phosphorus in goal with binders.  7. Nutrition - Albumin 2.7, continue renal diet and nepro supplement 8. Tobacco/polysubstance abuse- per primary 9. Social issues- Hx of polysubstance abuse, homelessness. CXW consulted and follow their recommendations. Pt wishes to continue HD and pending SNF at discharge.    Josefa Half, PA-S [facility]  Progress note written with assistance by PA student.  PA performed own history and physical exam.  Plan discussed with student and documented.  Jen Mow, PA-C [facility] 05/24/2021,12:28 PM  LOS: 6 days

## 2021-05-24 NOTE — TOC Progression Note (Signed)
Transition of Care [facility]) - Progression Note    Patient Details  Name: Raymond [name] MRN: [id number] Date of Birth: 1955/07/19  Transition of Care [facility]) CM/SW Contact  Sharlet Salina Mila Homer, LCSW Phone Number: 05/24/2021, 3:58 PM  Clinical Narrative:  Talked with patient at bedside regarding SNF placement and provided him with the 2 facility responses: [facility] and [facility]. [name] chose [facility]. Admissions director Antionette contacted regarding patient and can accept him pending insurance authorization. CSW checked progress notes for PT /OT and he had not been seen in the past 48 hours. PT/OT office contacted and CSW advised that PT has seen patient today and will get their note in and OT will see patient tomorrow. CSW will submit for insurance auth once OT note in. MD updated via secure chat.    Expected Discharge Plan: [place] Barriers to Discharge: Continued Medical Work up  Expected Discharge Plan and Services Expected Discharge Plan: [place] In-house Referral: Clinical Social Work     Living arrangements for the past 2 months: Apartment                                       Social Determinants of Health (SDOH) Interventions  No SDOH interventions requested or needed at this time.  Readmission Risk Interventions Readmission Risk Prevention Plan 06/22/2020  Transportation Screening Complete  Medication Review Press photographer) Complete  PCP or Specialist appointment within 3-5 days of discharge Complete  HRI or [place] Complete  SW Recovery Care/Counseling Consult Complete  [place] Patient Refused  Some recent data might be hidden

## 2021-05-25 DIAGNOSIS — E875 Hyperkalemia: Secondary | ICD-10-CM

## 2021-05-25 LAB — BASIC METABOLIC PANEL
Anion gap: 11 (ref 5–15)
BUN: 32 mg/dL — ABNORMAL HIGH (ref 8–23)
CO2: 25 mmol/L (ref 22–32)
Calcium: 8.2 mg/dL — ABNORMAL LOW (ref 8.9–10.3)
Chloride: 97 mmol/L — ABNORMAL LOW (ref 98–111)
Creatinine, Ser: 8.69 mg/dL — ABNORMAL HIGH (ref 0.61–1.24)
GFR, Estimated: 6 mL/min — ABNORMAL LOW (ref >60)
Glucose, Bld: 75 mg/dL (ref 70–99)
Potassium: 4.2 mmol/L (ref 3.5–5.1)
Sodium: 133 mmol/L — ABNORMAL LOW (ref 135–145)

## 2021-05-25 LAB — CBC
HCT: 27.8 % — ABNORMAL LOW (ref 39.0–52.0)
Hemoglobin: 8.4 g/dL — ABNORMAL LOW (ref 13.0–17.0)
MCH: 32.2 pg (ref 26.0–34.0)
MCHC: 30.2 g/dL (ref 30.0–36.0)
MCV: 106.5 fL — ABNORMAL HIGH (ref 80.0–100.0)
Platelets: 131 10*3/uL — ABNORMAL LOW (ref 150–400)
RBC: 2.61 MIL/uL — ABNORMAL LOW (ref 4.22–5.81)
RDW: 19.6 % — ABNORMAL HIGH (ref 11.5–15.5)
WBC: 4.1 10*3/uL (ref 4.0–10.5)
nRBC: 0 % (ref 0.0–0.2)

## 2021-05-25 LAB — RESP PANEL BY RT-PCR (FLU A&B, COVID) ARPGX2
Influenza A by PCR: NEGATIVE
Influenza B by PCR: NEGATIVE
SARS Coronavirus 2 by RT PCR: NEGATIVE

## 2021-05-25 LAB — RENAL FUNCTION PANEL
Albumin: 2.8 g/dL — ABNORMAL LOW (ref 3.5–5.0)
Anion gap: 11 (ref 5–15)
BUN: 33 mg/dL — ABNORMAL HIGH (ref 8–23)
CO2: 25 mmol/L (ref 22–32)
Calcium: 8.5 mg/dL — ABNORMAL LOW (ref 8.9–10.3)
Chloride: 95 mmol/L — ABNORMAL LOW (ref 98–111)
Creatinine, Ser: 9.03 mg/dL — ABNORMAL HIGH (ref 0.61–1.24)
GFR, Estimated: 6 mL/min — ABNORMAL LOW (ref >60)
Glucose, Bld: 84 mg/dL (ref 70–99)
Phosphorus: 3.7 mg/dL (ref 2.5–4.6)
Potassium: 4.1 mmol/L (ref 3.5–5.1)
Sodium: 131 mmol/L — ABNORMAL LOW (ref 135–145)

## 2021-05-25 MED ORDER — CALCITRIOL 0.5 MCG PO CAPS
2.0000 ug | ORAL_CAPSULE | ORAL | Status: DC
  Administered 2021-05-25: 2 ug via ORAL
  Filled 2021-05-25: qty 4

## 2021-05-25 MED ORDER — RAMELTEON 8 MG PO TABS: 8.0000 mg | ORAL_TABLET | Freq: Every day | ORAL | 0 refills | Status: DC

## 2021-05-25 MED ORDER — HEPARIN SODIUM (PORCINE) 1000 UNIT/ML DIALYSIS: 1000.0000 [IU] | INTRAMUSCULAR | Status: DC | PRN

## 2021-05-25 MED ORDER — LIDOCAINE HCL (PF) 1 % IJ SOLN: 5.0000 mL | INTRAMUSCULAR | Status: DC | PRN

## 2021-05-25 MED ORDER — CARVEDILOL 6.25 MG PO TABS
6.2500 mg | ORAL_TABLET | Freq: Two times a day (BID) | ORAL | Status: DC
Start: 2021-05-25 — End: 2021-09-13

## 2021-05-25 MED ORDER — DARBEPOETIN ALFA 200 MCG/0.4ML IJ SOSY: 200.0000 ug | PREFILLED_SYRINGE | INTRAMUSCULAR | Status: DC

## 2021-05-25 MED ORDER — SODIUM CHLORIDE 0.9 % IV SOLN: 100.0000 mL | INTRAVENOUS | Status: DC | PRN

## 2021-05-25 MED ORDER — AMLODIPINE BESYLATE 10 MG PO TABS: 10.0000 mg | ORAL_TABLET | Freq: Every day | ORAL | Status: DC

## 2021-05-25 MED ORDER — ALTEPLASE 2 MG IJ SOLR: 2.0000 mg | Freq: Once | INTRAMUSCULAR | Status: DC | PRN

## 2021-05-25 MED ORDER — RENA-VITE PO TABS: 1.0000 | ORAL_TABLET | Freq: Every day | ORAL | 0 refills | Status: DC

## 2021-05-25 MED ORDER — PENTAFLUOROPROP-TETRAFLUOROETH EX AERO: 1.0000 "application " | INHALATION_SPRAY | CUTANEOUS | Status: DC | PRN

## 2021-05-25 MED ORDER — SERTRALINE HCL 50 MG PO TABS: 50.0000 mg | ORAL_TABLET | Freq: Every day | ORAL | 0 refills | Status: DC

## 2021-05-25 MED ORDER — CALCITRIOL 0.5 MCG PO CAPS: 2.0000 ug | ORAL_CAPSULE | ORAL | Status: DC

## 2021-05-25 MED ORDER — LIDOCAINE-PRILOCAINE 2.5-2.5 % EX CREA: 1.0000 "application " | TOPICAL_CREAM | CUTANEOUS | Status: DC | PRN

## 2021-05-25 NOTE — TOC Progression Note (Addendum)
Transition of Care [facility]) - Progression Note    Patient Details  Name: LATROY GAYMON MRN: [id number] Date of Birth: 1956-02-18  Transition of Care [facility]) CM/SW Contact  Sharlet Salina Mila Homer, LCSW Phone Number: 05/25/2021, 1:05 PM  Clinical Narrative:   Submitted for insurance authorization via [facility] Access. Olene Floss ID #[id number]. Authorization is currently pending. Contacted [facility] and left message with admissions director regarding initiating insurance authorization.     Expected Discharge Plan: [place] Barriers to Discharge: Continued Medical Work up  Expected Discharge Plan and Services Expected Discharge Plan: [place] In-house Referral: Clinical Social Work     Living arrangements for the past 2 months: Apartment                                       Social Determinants of Health (SDOH) Interventions    Readmission Risk Interventions Readmission Risk Prevention Plan 06/22/2020  Transportation Screening Complete  Medication Review Press photographer) Complete  PCP or Specialist appointment within 3-5 days of discharge Complete  HRI or [place] Complete  SW Recovery Care/Counseling Consult Complete  [place] Patient Refused  Some recent data might be hidden

## 2021-05-25 NOTE — Progress Notes (Signed)
Report given to TI LPN of [place]. Awaiting for PTAR.

## 2021-05-25 NOTE — TOC Transition Note (Addendum)
Transition of Care [facility]) - CM/SW Discharge Note *Discharged to [facility]   Patient Details  Name: Raymond [name] MRN: [id number] Date of Birth: 18-Sep-1955  Transition of Care [facility]) CM/SW Contact:  Sable Feil, LCSW Phone Number: 05/25/2021, 3:23 PM   Clinical Narrative: Patient discharging today to [place] facility for short-term rehab. Insurance auth received from [employer]: auth ID #[id number], effective 11/15 - 111. Auth information provided to admissions director at [facility]. Ambulance transport arranged with non-emergency ambulance transport (PTAR).      Final next level of care: [facility]) Barriers to Discharge: Continued Medical Work up   Patient Goals and CMS Choice Patient states their goals for this hospitalization and ongoing recovery are:: Patient agreeable to ST rehab before returning home CMS Medicare.gov Compare Post Acute Care list provided to:: Other (Comment Required) (Medicare.gov offered when bed offers provided and not needed by patient) Choice offered to / list presented to : NA  Discharge Placement   Existing PASRR number confirmed : 05/23/21          Patient chooses bed at: Other - please specify in the comment section below: [facility]) Patient to be transferred to facility by: Non-emergency ambulance transport   Patient and family notified of of transfer: 05/25/21  Discharge Plan and Services In-house Referral: Clinical Social Work                                   Social Determinants of Health (SDOH) Interventions  No SDOH interventions requested or needed at discharge   Readmission Risk Interventions Readmission Risk Prevention Plan 06/22/2020  Transportation Screening Complete  Medication Review Press photographer) Complete  PCP or Specialist appointment within 3-5 days of discharge Complete  HRI or [place] Complete  SW Recovery Care/Counseling  Consult Complete  [place] Patient Refused  Some recent data might be hidden

## 2021-05-25 NOTE — Progress Notes (Signed)
Pt to d/c to [facility] today. CSW advised snf of pt's HD days/time. Contacted pt's clinic regarding pt's d/c to snf today and will resume care on Thursday.  Melven Sartorius Renal Navigator [phone]

## 2021-05-25 NOTE — Discharge Summary (Signed)
Name: Raymond [name] MRN: [id number] DOB: 03/29/1956 65 y.o. PCP: Raymond Side., FNP  Date of Admission: 05/18/2021 11:20 AM Date of Discharge: 05/25/2021 2:00 PM Attending Physician: Raymond Ochs, MD  Discharge Diagnosis: 1.  Uremic Encephalopathy 2.  End-stage renal disease 3.  Hypertension 4.  Hyperphosphatemia 5.  COPD 6.  Major depressive disorder 7.  Macrocytic anemia 8.  Substance use disorder 9.  Dyslipidemia 10. Insomnia  Discharge Medications: Allergies as of 05/25/2021   No Known Allergies      Medication List     STOP taking these medications    dextrose 40 % Gel Commonly known as: GLUTOSE   metoprolol succinate 50 MG 24 hr tablet Commonly known as: TOPROL-XL   multivitamin with minerals Tabs tablet   QUEtiapine 100 MG tablet Commonly known as: SEROQUEL       TAKE these medications    albuterol 108 (90 Base) MCG/ACT inhaler Commonly known as: VENTOLIN HFA Inhale 2 puffs into the lungs every 4 (four) hours as needed for shortness of breath or wheezing.   amLODipine 10 MG tablet Commonly known as: NORVASC Take 1 tablet (10 mg total) by mouth daily. Start taking on: May 26, 2021 What changed:  medication strength how much to take additional instructions   calcitRIOL 0.5 MCG capsule Commonly known as: ROCALTROL Take 4 capsules (2 mcg total) by mouth Every Tuesday,Thursday,and Saturday with dialysis. Start taking on: May 27, 2021   carvedilol 6.25 MG tablet Commonly known as: COREG Take 1 tablet (6.25 mg total) by mouth 2 (two) times daily with a meal.   cinacalcet 30 MG tablet Commonly known as: SENSIPAR Take 6 tablets (180 mg total) by mouth Every Tuesday,Thursday,and Saturday with dialysis.   Darbepoetin Alfa 200 MCG/0.4ML Sosy injection Commonly known as: ARANESP Inject 0.4 mLs (200 mcg total) into the vein every Tuesday with hemodialysis.   folic acid 1 MG tablet Commonly known as: FOLVITE Take 1 tablet (1  mg total) by mouth daily.   multivitamin Tabs tablet Take 1 tablet by mouth at bedtime.   nicotine 14 mg/24hr patch Commonly known as: NICODERM CQ - dosed in mg/24 hours Place 1 patch (14 mg total) onto the skin daily.   ramelteon 8 MG tablet Commonly known as: ROZEREM Take 1 tablet (8 mg total) by mouth at bedtime.   rosuvastatin 10 MG tablet Commonly known as: CRESTOR Take 1 tablet (10 mg total) by mouth daily.   sertraline 50 MG tablet Commonly known as: ZOLOFT Take 1 tablet (50 mg total) by mouth daily. Start taking on: May 26, 2021 What changed:  medication strength how much to take   sevelamer carbonate 800 MG tablet Commonly known as: RENVELA Take 4 tablets (3,200 mg total) by mouth 3 (three) times daily with meals. What changed: how much to take   thiamine 100 MG tablet Take 1 tablet (100 mg total) by mouth daily.        Disposition and follow-up:   [name] was discharged from [facility] in Stable condition.  At the hospital follow up visit please address:  1.   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  Hypertension: Patient's blood pressure was not under control likely due to noncompliance with home meds and not attending dialysis.  Increased amlodipine to 10 mg daily and added Coreg 6.25 twice daily.  Please assess if patient has been compliant with medications  and if blood pressure is under control.  Hyperphosphatemia: Please assess if patient has been compliant with his Renvela.  COPD: Patient has not required any as needed nebulizers while in the hospital.  Please assess if patient has had any shortness of breath since discharge.  Major depressive disorder: Patient may have been significantly depressed which led to his noncompliance with dialysis and medication.  Patient's Zoloft was increased to 50 mg while in the  hospital.  May consider switching patient to Remeron given patient has reported loss of appetite and poor sleep.  Macrocytic anemia: Please assess the patient's hemoglobin has not declined.  Substance use disorder Patient requires substance rehab due to history of cocaine use.  Please assess last use of cocaine, frequency of alcohol use and smoking habits.   Dyslipidemia Please assess if patient requires additional medication management for patient's dyslipidemia.  Insomnia Please assess if patient has been sleeping appropriately.  Patient is currently on ramelteon.  Can consider switching from Zoloft to mirtazapine in order to better aid with depression, sleep and appetite.   2.  Labs / imaging needed at time of follow-up: CBC, BMP  3.  Pending labs/ test needing follow-up: none  Follow-up Appointments:   Hospital Course by problem list: ESRD Uremia Patient reports being noncompliant with hemodialysis since hospitalization since 10/6. Patient reports this is due to worsening depression but may also be due to hypoxic brain injury (due to PEA arrest last hospitalization) Upon evaluation, patient appears confused, disoriented, nauseous and reported anorexia.  Patient's BUN was 213 on admission and creatinine of 27.19.  Nephrology was consulted and recommended serial hemodialysis to prevent dialysis disequilibrium syndrome.  Since resuming dialysis, patient's mentation has drastically improved and patient began to become more alert and oriented throughout hospitalization.  Patient's BUN near discharge is around 25.  Hyperkalemia Patient's potassium was found to be 6.5 on admission.  No ECG changes were noted.  Patient was given 1 dose of Lokelma 10 g.  Patient's potassium was around 4.1 following serial HD.   Hypertension likely 2/2 medication noncompliance Patient's systolic blood pressure was around 160 when initially admitted.  Resume patient's home medications of amlodipine and titrated  that up to 10 mg daily.  Switched patient's metoprolol to carvedilol given patient's cocaine use history.  Patient's blood pressure has been around 131/77 following medication adjustments.   Hyperphosphatemia Patient's phosphorus was 9.1 on admission.  Patient was resumed on Renvela and hemodialysis.  Patient's phosphorus is now at 3.6.   COPD Patient has been satting around 90% on room air.  Patient received 1 ipratropium nebulizer in the ED and has not required DuoNeb during hospitalization.   Depression Given poor patient history and uremic encephalopathy, uncertain whether patient had discontinued all medications and dialysis due to depression.  However, patient has had a history of depression and retrospectively reported that he may have been attempting to kill himself.  Psych was consulted and recommended increasing patient's Zoloft to 50 mg daily and recommend outpatient patient behavioral health follow-up as well as substance use rehab.    Macrocytic Anemia likely due to chronic alcohol use Patient had a negative ethanol level this hospitalization and normal folate/B12 levels 1 month ago.  Patient is on aranesp during HD.   Hx of Hypoglycemia PEA arrest Patient has had multiple hospitalization due to hypoglycemia.  During previous hospitalization, insulinoma and adrenal insufficiency had been ruled out.  More likely that hypoglycemia is related to ESRD and poor p.o. intake at this time.  Alcohol Use Disorder Substance Use Disorder Nicotine Use Disorder Patient did not have symptoms of alcohol withdrawal during this admission. CIWA score ranged from 0-2.  NicoDerm patch was resumed for patient for smoking cessation.  Dyslipidemia Patient was resumed on home med Crestor 10 mg daily  Discharge Exam:   BP (!) 148/78 (BP Location: Right Arm)   Pulse 65   Temp 98.3 F (36.8 C) (Oral)   Resp 18   Ht 5\' 8"  (1.727 m)   Wt 64 kg   SpO2 100%   BMI 21.45 kg/m  Discharge exam:   Physical Exam Constitutional:      Appearance: Normal appearance.  Cardiovascular:     Rate and Rhythm: Normal rate and regular rhythm.     Pulses: Normal pulses.     Heart sounds: Normal heart sounds. No murmur heard.   No friction rub. No gallop.  Pulmonary:     Effort: Pulmonary effort is normal. No respiratory distress.     Breath sounds: Normal breath sounds. No stridor. No wheezing, rhonchi or rales.  Skin:    General: Skin is warm and dry.  Neurological:     Mental Status: He is alert and oriented to person, place, and time. Mental status is at baseline.  Psychiatric:        Mood and Affect: Mood normal.        Thought Content: Thought content normal.    Pertinent Labs, Studies, and Procedures:  CMP Latest Ref Rng & Units 05/25/2021 05/25/2021 05/24/2021  Glucose 70 - 99 mg/dL 84 75 74  BUN 8 - 23 mg/dL 33(H) 32(H) 25(H)  Creatinine 0.61 - 1.24 mg/dL 9.03(H) 8.69(H) 7.52(H)  Sodium 135 - 145 mmol/L 131(L) 133(L) 134(L)  Potassium 3.5 - 5.1 mmol/L 4.1 4.2 4.0  Chloride 98 - 111 mmol/L 95(L) 97(L) 96(L)  CO2 22 - 32 mmol/L 25 25 30   Calcium 8.9 - 10.3 mg/dL 8.5(L) 8.2(L) 8.0(L)  Total Protein 6.5 - 8.1 g/dL - - -  Total Bilirubin 0.3 - 1.2 mg/dL - - -  Alkaline Phos 38 - 126 U/L - - -  AST 15 - 41 U/L - - -  ALT 0 - 44 U/L - - -    CBC    Component Value Date/Time   WBC 4.1 05/25/2021 0701   RBC 2.61 (L) 05/25/2021 0701   HGB 8.4 (L) 05/25/2021 0701   HGB 11.0 (L) 03/27/2018 1420   HCT 27.8 (L) 05/25/2021 0701   HCT 32.7 (L) 03/27/2018 1420   PLT 131 (L) 05/25/2021 0701   PLT 174 03/27/2018 1420   MCV 106.5 (H) 05/25/2021 0701   MCV 101 (H) 03/27/2018 1420   MCH 32.2 05/25/2021 0701   MCHC 30.2 05/25/2021 0701   RDW 19.6 (H) 05/25/2021 0701   RDW 14.6 03/27/2018 1420   LYMPHSABS 0.6 (L) 05/18/2021 1209   LYMPHSABS 0.9 03/27/2018 1420   MONOABS 0.4 05/18/2021 1209   EOSABS 0.1 05/18/2021 1209   EOSABS 0.1 03/27/2018 1420   BASOSABS 0.0 05/18/2021 1209    BASOSABS 0.0 03/27/2018 1420   DG Chest Port 1 View  Result Date: 05/18/2021 CLINICAL DATA:  Dyspnea EXAM: PORTABLE CHEST 1 VIEW COMPARISON:  04/03/2021 chest radiograph. FINDINGS: Stable cardiomediastinal silhouette with moderate cardiomegaly. No pneumothorax. No pleural effusion. Borderline mild pulmonary edema. No acute consolidative airspace disease. IMPRESSION: Borderline mild congestive heart failure. Electronically Signed   By: Ilona Sorrel M.D.   On: 05/18/2021 11:57    Discharge Instructions: Discharge Instructions  Call MD for:  difficulty breathing, headache or visual disturbances   Complete by: As directed    Call MD for:  extreme fatigue   Complete by: As directed    Call MD for:  persistant dizziness or light-headedness   Complete by: As directed    Call MD for:  persistant nausea and vomiting   Complete by: As directed    Call MD for:  severe uncontrolled pain   Complete by: As directed    Diet - low sodium heart healthy   Complete by: As directed    Discharge instructions   Complete by: As directed    Dear Mr. Lepera,  It was a pleasure taking care of you while you were hospitalized. You were admitted to the hospital because of altered mental status and not attending dialysis for some time.  We did multiple dialysis sessions in order to remove toxins from your blood.  We also increased your antidepressant and blood pressure medications in order to better control for depression and blood pressure.  You will be transferred to a skilled nursing facility to build up your strength prior to returning your niece's.  Please attend dialysis regularly and follow-up with your PCP.  Take Care! Dr. Lurline Hare and the [place] Team   Increase activity slowly   Complete by: As directed    No wound care   Complete by: As directed        Signed: France Ravens, MD 05/25/2021, 2:16 PM   Pager: @[phone] @

## 2021-05-25 NOTE — Progress Notes (Addendum)
[facility] Progress Note   Subjective:   Patient seen and examined at bedside during dialysis.  Tolerating treatment well.  No specific complaints.  Denies CP, SOB, abdominal pain, and n/v/d.   Objective Vitals:   05/25/21 0900 05/25/21 0930 05/25/21 1000 05/25/21 1030  BP: 114/88 139/77 107/90 124/74  Pulse: 67 70 61 63  Resp:      Temp:      TempSrc:      SpO2:      Weight:      Height:       Physical Exam General:chronically ill appearing male in NAD Heart:RRR, no mrg Lungs:CTAB, nml WOB on RA Abdomen:soft, NTND Extremities:no LE edema Dialysis Access: LU AVF in use   Filed Weights   05/23/21 0500 05/24/21 0509 05/25/21 0735  Weight: 66 kg 65.4 kg 67.2 kg   No intake or output data in the 24 hours ending 05/25/21 1049  Additional Objective Labs: Basic Metabolic Panel: Recent Labs  Lab 05/21/21 0704 05/22/21 0351 05/24/21 0738 05/25/21 0320 05/25/21 0701  NA 136   < > 134* 133* 131*  K 3.8   < > 4.0 4.2 4.1  CL 97*   < > 96* 97* 95*  CO2 31   < > 30 25 25   GLUCOSE 96   < > 74 75 84  BUN 18   < > 25* 32* 33*  CREATININE 6.05*   < > 7.52* 8.69* 9.03*  CALCIUM 8.6*   < > 8.0* 8.2* 8.5*  PHOS 4.9*  --  3.6  --  3.7   < > = values in this interval not displayed.   Liver Function Tests: Recent Labs  Lab 05/18/21 1209 05/18/21 1635 05/21/21 0704 05/24/21 0738 05/25/21 0701  AST <5*  --   --   --   --   ALT 9  --   --   --   --   ALKPHOS 62  --   --   --   --   BILITOT 0.9  --   --   --   --   PROT 7.3  --   --   --   --   ALBUMIN 3.3*   < > 2.8* 2.7* 2.8*   < > = values in this interval not displayed.    CBC: Recent Labs  Lab 05/18/21 1209 05/19/21 0503 05/20/21 0616 05/21/21 0704 05/22/21 0351 05/23/21 0545 05/25/21 0701  WBC 3.2*   < > 3.4* 3.4* 3.8* 3.6* 4.1  NEUTROABS 2.1  --   --   --   --   --   --   HGB 8.0*   < > 7.5* 8.7* 7.7* 8.5* 8.4*  HCT 25.3*   < > 24.7* 28.1* 26.1* 28.8* 27.8*  MCV 103.3*   < > 104.2*  106.0* 107.9* 106.7* 106.5*  PLT 126*   < > 103* 108* 116* 131* 131*   < > = values in this interval not displayed.   CBG: Recent Labs  Lab 05/18/21 2304 05/19/21 0557 05/20/21 0633 05/21/21 0658 05/23/21 0549  GLUCAP 113* 108* 82 78 79   Medications:  sodium chloride     sodium chloride      amLODipine  10 mg Oral Daily   carvedilol  6.25 mg Oral BID WC   Chlorhexidine Gluconate Cloth  6 each Topical Q0600   Chlorhexidine Gluconate Cloth  6 each Topical Q0600   cinacalcet  180 mg Oral Q T,Th,Sa-HD   darbepoetin (  ARANESP) injection - DIALYSIS  200 mcg Intravenous Q Tue-HD   feeding supplement (NEPRO CARB STEADY)  237 mL Oral BID BM   folic acid  1 mg Oral Daily   heparin injection (subcutaneous)  5,000 Units Subcutaneous Q8H   multivitamin  1 tablet Oral QHS   nicotine  14 mg Transdermal Daily   ramelteon  8 mg Oral QHS   rosuvastatin  10 mg Oral Daily   sertraline  50 mg Oral Daily   sevelamer carbonate  3,200 mg Oral TID WC   thiamine  100 mg Oral Daily    Dialysis Orders: [facility] T,Th,S 4 hrs 180NRe 400/Autoflow 1.5 68.8 kg 2.0K/2.0 Ca UFP 4 AVF -No Heparin -Cinacalcet 180 mg PO TIW -Mircera 200 mcg IV q 2 weeks (last dose 04/29/2021) -Calcitriol 2.75 mcg PO TIW  Assessment/Plan: 1. Lethargy/AMS- 2/2 uremia from non compliance with HD. Now resolved with serial HD. Nausea improved today. 2. Hyperkalemia- resolved with HD. K 4.1 today.  3. ESRD- on TTS.  Missed 2 weeks of treatment as outpatient.  Now back on schedule.  4. Anemia of CKD- Hgb 8.4 today.  Missed ESA d/t non compliance.  Aranesp 27mcg given today.  5. HTN/volume- Blood pressure well controlled with amlodopine 10mg  qd, coreg 6.25mg  BID.  Under dry weight during admit, likely weight loss, will need to lower on dc ~65kg 6. Metabolic bone disease- Correct Ca in goal.  Restarted calcitriol at lower dose. Phosphorus in goal with binders.  7. Nutrition - Albumin 2.7, continue renal diet and nepro supplement 8.  Tobacco/polysubstance abuse- per primary 9. Social issues- Hx of polysubstance abuse, homelessness. CXW consulted and follow their recommendations. To be dc'd to [facility] today.   Jen Mow, PA-C [facility] 05/25/2021,10:49 AM  LOS: 7 days   Pt seen, examined and agree w assess/plan as above with additions as indicated.  [facility] 05/25/2021, 2:56 PM

## 2021-05-25 NOTE — Plan of Care (Signed)
  Problem: Education: Goal: Knowledge of General Education information will improve Description: Including pain rating scale, medication(s)/side effects and non-pharmacologic comfort measures Outcome: Adequate for Discharge   Problem: Health Behavior/Discharge Planning: Goal: Ability to manage health-related needs will improve Outcome: Adequate for Discharge   Problem: Clinical Measurements: Goal: Ability to maintain clinical measurements within normal limits will improve Outcome: Adequate for Discharge Goal: Will remain free from infection Outcome: Adequate for Discharge Goal: Diagnostic test results will improve Outcome: Adequate for Discharge Goal: Respiratory complications will improve Outcome: Adequate for Discharge Goal: Cardiovascular complication will be avoided Outcome: Adequate for Discharge   Problem: Activity: Goal: Risk for activity intolerance will decrease Outcome: Adequate for Discharge   Problem: Coping: Goal: Level of anxiety will decrease Outcome: Adequate for Discharge   Problem: Elimination: Goal: Will not experience complications related to bowel motility Outcome: Adequate for Discharge Goal: Will not experience complications related to urinary retention Outcome: Adequate for Discharge   Problem: Pain Managment: Goal: General experience of comfort will improve Outcome: Adequate for Discharge   Problem: Safety: Goal: Ability to remain free from injury will improve Outcome: Adequate for Discharge   Problem: Skin Integrity: Goal: Risk for impaired skin integrity will decrease Outcome: Adequate for Discharge   Problem: Education: Goal: Knowledge of disease and its progression will improve Outcome: Adequate for Discharge Goal: Individualized Educational Video(s) Outcome: Adequate for Discharge   Problem: Fluid Volume: Goal: Compliance with measures to maintain balanced fluid volume will improve Outcome: Adequate for Discharge   Problem: Health  Behavior/Discharge Planning: Goal: Ability to manage health-related needs will improve Outcome: Adequate for Discharge   Problem: Nutritional: Goal: Ability to make healthy dietary choices will improve Outcome: Adequate for Discharge   Problem: Clinical Measurements: Goal: Complications related to the disease process, condition or treatment will be avoided or minimized Outcome: Adequate for Discharge

## 2021-05-28 ENCOUNTER — Emergency Department (HOSPITAL_COMMUNITY)
Admission: EM | Admit: 2021-05-28 | Discharge: 2021-05-28 | Disposition: A | Discharge status: Home / Self Care | Payer: 59 | Attending: Emergency Medicine | Admitting: Emergency Medicine

## 2021-05-28 ENCOUNTER — Encounter (HOSPITAL_COMMUNITY): Payer: Self-pay | Admitting: Emergency Medicine

## 2021-05-28 ENCOUNTER — Other Ambulatory Visit: Payer: Self-pay

## 2021-05-28 DIAGNOSIS — I132 Hypertensive heart and chronic kidney disease with heart failure and with stage 5 chronic kidney disease, or end stage renal disease: Secondary | ICD-10-CM | POA: Diagnosis not present

## 2021-05-28 DIAGNOSIS — E1122 Type 2 diabetes mellitus with diabetic chronic kidney disease: Secondary | ICD-10-CM | POA: Insufficient documentation

## 2021-05-28 DIAGNOSIS — I252 Old myocardial infarction: Secondary | ICD-10-CM | POA: Insufficient documentation

## 2021-05-28 DIAGNOSIS — Z7901 Long term (current) use of anticoagulants: Secondary | ICD-10-CM | POA: Insufficient documentation

## 2021-05-28 DIAGNOSIS — R040 Epistaxis: Secondary | ICD-10-CM | POA: Insufficient documentation

## 2021-05-28 DIAGNOSIS — J449 Chronic obstructive pulmonary disease, unspecified: Secondary | ICD-10-CM | POA: Diagnosis not present

## 2021-05-28 DIAGNOSIS — N186 End stage renal disease: Secondary | ICD-10-CM | POA: Diagnosis not present

## 2021-05-28 DIAGNOSIS — I509 Heart failure, unspecified: Secondary | ICD-10-CM | POA: Insufficient documentation

## 2021-05-28 DIAGNOSIS — I2510 Atherosclerotic heart disease of native coronary artery without angina pectoris: Secondary | ICD-10-CM | POA: Diagnosis not present

## 2021-05-28 DIAGNOSIS — Z992 Dependence on renal dialysis: Secondary | ICD-10-CM | POA: Diagnosis not present

## 2021-05-28 LAB — COMPREHENSIVE METABOLIC PANEL
ALT: 17 U/L (ref 0–44)
AST: 27 U/L (ref 15–41)
Albumin: 3.1 g/dL — ABNORMAL LOW (ref 3.5–5.0)
Alkaline Phosphatase: 62 U/L (ref 38–126)
Anion gap: 8 (ref 5–15)
BUN: 27 mg/dL — ABNORMAL HIGH (ref 8–23)
CO2: 32 mmol/L (ref 22–32)
Calcium: 8.9 mg/dL (ref 8.9–10.3)
Chloride: 96 mmol/L — ABNORMAL LOW (ref 98–111)
Creatinine, Ser: 5.7 mg/dL — ABNORMAL HIGH (ref 0.61–1.24)
GFR, Estimated: 10 mL/min — ABNORMAL LOW (ref >60)
Glucose, Bld: 81 mg/dL (ref 70–99)
Potassium: 4 mmol/L (ref 3.5–5.1)
Sodium: 136 mmol/L (ref 135–145)
Total Bilirubin: 0.5 mg/dL (ref 0.3–1.2)
Total Protein: 7.2 g/dL (ref 6.5–8.1)

## 2021-05-28 LAB — CBC WITH DIFFERENTIAL/PLATELET
Abs Immature Granulocytes: 0.01 10*3/uL (ref 0.00–0.07)
Basophils Absolute: 0 10*3/uL (ref 0.0–0.1)
Basophils Relative: 0 %
Eosinophils Absolute: 0.1 10*3/uL (ref 0.0–0.5)
Eosinophils Relative: 1 %
HCT: 30.7 % — ABNORMAL LOW (ref 39.0–52.0)
Hemoglobin: 9 g/dL — ABNORMAL LOW (ref 13.0–17.0)
Immature Granulocytes: 0 %
Lymphocytes Relative: 32 %
Lymphs Abs: 1.2 10*3/uL (ref 0.7–4.0)
MCH: 32.5 pg (ref 26.0–34.0)
MCHC: 29.3 g/dL — ABNORMAL LOW (ref 30.0–36.0)
MCV: 110.8 fL — ABNORMAL HIGH (ref 80.0–100.0)
Monocytes Absolute: 1.1 10*3/uL — ABNORMAL HIGH (ref 0.1–1.0)
Monocytes Relative: 30 %
Neutro Abs: 1.4 10*3/uL — ABNORMAL LOW (ref 1.7–7.7)
Neutrophils Relative %: 37 %
Platelets: 128 10*3/uL — ABNORMAL LOW (ref 150–400)
RBC: 2.77 MIL/uL — ABNORMAL LOW (ref 4.22–5.81)
RDW: 19.7 % — ABNORMAL HIGH (ref 11.5–15.5)
WBC: 3.7 10*3/uL — ABNORMAL LOW (ref 4.0–10.5)
nRBC: 0 % (ref 0.0–0.2)

## 2021-05-28 MED ORDER — OXYMETAZOLINE HCL 0.05 % NA SOLN
2.0000 | Freq: Once | NASAL | Status: AC
  Administered 2021-05-28: 2 via NASAL
  Filled 2021-05-28: qty 30

## 2021-05-28 NOTE — ED Triage Notes (Signed)
PT to ER via EMS from [facility] with c/o nosebleed that lasted approximately 1 hour, and has now resolved.  EMS reports pt was mildly hypotensive and felt dizzy.  Pt reports feels "woozy" at this time.  Pt is dialysis pt, last treatment was yesterday.

## 2021-05-28 NOTE — ED Notes (Signed)
Three phone calls placed to [facility]  Staff states that EMS will need to bring pt back to the facility.

## 2021-05-28 NOTE — ED Provider Notes (Signed)
[facility] DEPT Provider Note   CSN: [id number] Arrival date & time: 05/28/21  1008     History Chief Complaint  Patient presents with   Epistaxis    Raymond [name] is a 65 y.o. male.  HPI     65yo male with history of CAD, COPD, ESRD, hypertension, PAD, DM, HF, GIB, hx of cardiac arrest 9/24, admission for hyperkalemia 11/8, who presents from [place] with nosebleed.  Had dialysis yesterday.  Reports that the nosebleed lasted for approximately 1 hour.  He put ice over it and it did not stop, and he put tissues up his nostril and continued to bleed.  Reports that it improved just as EMS was taking him to the hospital.  He did not receive any medications with EMS.  Reports that he woke up today with fatigue, and just feeling off, but denies any other acute symptoms.  Specifically, he denies headache, increased nausea from his baseline, vomiting, fever, cough, shortness of breath, chest pain, new numbness or weakness.  He is not on anticoagulation.  Past Medical History:  Diagnosis Date   Anemia    CAD (coronary artery disease)    a. 03/2018: cath showing 74 to 30% stenosis along the LAD, luminal irregularities along the RCA, and angiographically normal LCx   COPD (chronic obstructive pulmonary disease) (HCC)    ESRD (end stage renal disease) on dialysis [facility])    "MWF; [facility]" (07/22/17)   Hepatitis C    Still positive s/p liver biopsy at [facility]  and interferon therapy for 6 months. Most recent lab work was on 10/24/12   Hepatitis C    "took the tx; gone now" (12/05/2016)  Was treated   History of blood transfusion ~ 2012/2013   "related to my kidneys; blood was low"   Hypertension    Peripheral arterial disease ([place])    Substance abuse ([place])    Thyroid disease    Type 2 diabetes mellitus with other diabetic kidney complication ([place]) 3/53/2992    Patient Active Problem List   Diagnosis Date Noted   Uremia    Severe episode of  recurrent major depressive disorder, without psychotic features ([place])    ESRD (end stage renal disease) on dialysis ([place]) 05/18/2021   Cardiac arrest ([place]) 04/03/2021   Anisocoria 04/03/2021   HFrEF (heart failure with reduced ejection fraction) ([place]) 04/03/2021   Hypothermia not due to cold exposure 04/03/2021   Junctional bradycardia 04/03/2021   Pancytopenia ([place]) 01/25/2021   AMS (altered mental status) 01/19/2021   Chronic viral hepatitis C ([place]) 12/25/2020   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 ([place]) 08/05/2020   Acute upper GI bleed 42/68/3419   Acute metabolic encephalopathy 62/22/9798   COVID-19 virus infection 07/29/2020   AVM (arteriovenous malformation) of stomach, acquired with hemorrhage    AVM (arteriovenous malformation) of small bowel, acquired with hemorrhage    Current use of long term anticoagulation    Sensorineural hearing loss (SNHL) of both ears 03/30/2020   History of arteriovenous malformation (AVM) 01/14/2020   UGIB (upper gastrointestinal bleed) 01/14/2020   Acute blood loss anemia 01/14/2020   Elevated troponin 01/14/2020   Allergy, unspecified, initial encounter 04/29/2019   Gastroenteritis 12/24/2018   Substance use disorder 12/24/2018   Type 2 diabetes mellitus with other diabetic kidney complication ([place]) 92/05/9416   Noncompliance of patient with renal dialysis ([place]) 09/12/2018   Benign neoplasm of colon    Melena    Anemia due to chronic  kidney disease, on chronic dialysis ([place])    Anemia    H/O medication noncompliance    Polysubstance (excluding opioids) dependence (HCC)    Atrial flutter with rapid ventricular response ([place]) 08/10/2018   PAF (paroxysmal atrial fibrillation) ([place]) 08/01/2018   Acute respiratory failure with hypoxia ([place]) 07/13/2018   Non-compliance with renal dialysis ([place]) 07/13/2018   PAD (peripheral artery disease) ([place]) 04/11/2018   Sepsis ([place]) 03/31/2018   Preop cardiovascular exam 03/29/2018    Idiopathic gout, unspecified site 12/13/2017   Other specified complication of vascular prosthetic devices, implants and grafts, initial encounter ([place]) 11/03/2017   Volume overload 08/07/2017   Acute pulmonary edema (HCC)    Dyspnea 06/20/2017   Acute bronchitis    COPD (chronic obstructive pulmonary disease) ([place])    ESRD on hemodialysis ([place])    Hypoxia 12/05/2016   Hyperkalemia 12/19/2015   Hypoglycemia 12/19/2015   Polysubstance abuse ([place]) 12/19/2015   Homeless 12/19/2015   Anemia associated with stage 5 chronic renal failure ([place]) 12/19/2015   BPH without urinary obstruction 10/29/2015   Erectile dysfunction 10/29/2015   Hematuria, gross 10/29/2015   Hypercalcemia 11/07/2014   Pruritus, unspecified 10/17/2014   Coagulation defect, unspecified ([place]) 09/17/2014   Atypical chest pain 12/19/2013   Thrombocytopenia, unspecified ([place]) 10/17/2013   Iron deficiency anemia, unspecified 08/28/2013   Atherosclerosis of native arteries of extremity with intermittent claudication ([place]) 12/04/2012   ESRD (end stage renal disease) ([place]) 12/04/2012   Hypertensive chronic kidney disease with stage 5 chronic kidney disease or end stage renal disease ([place]) 11/20/2012   Secondary hyperparathyroidism of renal origin ([place]) 11/20/2012   HEPATITIS C 09/07/2006   HYPERCHOLESTEROLEMIA 09/07/2006   HYPERTENSION, BENIGN SYSTEMIC 09/07/2006    Past Surgical History:  Procedure Laterality Date   ABDOMINAL AORTOGRAM W/LOWER EXTREMITY N/A 02/08/2018   Procedure: ABDOMINAL AORTOGRAM W/LOWER EXTREMITY;  Surgeon: Conrad Dutch John, MD;  Location: [facility];  Service: Cardiovascular;  Laterality: N/A;   ABDOMINAL AORTOGRAM W/LOWER EXTREMITY Bilateral 06/15/2020   Procedure: ABDOMINAL AORTOGRAM W/LOWER EXTREMITY;  Surgeon: Waynetta Sandy, MD;  Location: [facility];  Service: Cardiovascular;  Laterality: Bilateral;   AV FISTULA PLACEMENT Left Aug. 2013 ?   BIOPSY  01/17/2020   Procedure:  BIOPSY;  Surgeon: Jackquline Denmark, MD;  Location: [facility];  Service: Endoscopy;;   COLONOSCOPY WITH PROPOFOL N/A 08/21/2018   Procedure: COLONOSCOPY WITH PROPOFOL;  Surgeon: Yetta Flock, MD;  Location: [facility];  Service: Gastroenterology;  Laterality: N/A;   ENTEROSCOPY N/A 01/17/2020   Procedure: ENTEROSCOPY;  Surgeon: Jackquline Denmark, MD;  Location: [facility] ENDOSCOPY;  Service: Endoscopy;  Laterality: N/A;   ENTEROSCOPY N/A 07/31/2020   Procedure: ENTEROSCOPY;  Surgeon: Doran Stabler, MD;  Location: [facility];  Service: Gastroenterology;  Laterality: N/A;   ENTEROSCOPY N/A 01/27/2021   Procedure: ENTEROSCOPY;  Surgeon: Yetta Flock, MD;  Location: [facility] ENDOSCOPY;  Service: Gastroenterology;  Laterality: N/A;   ESOPHAGOGASTRODUODENOSCOPY (EGD) WITH PROPOFOL N/A 08/20/2018   Procedure: ESOPHAGOGASTRODUODENOSCOPY (EGD) WITH PROPOFOL;  Surgeon: Gatha Mayer, MD;  Location: [facility];  Service: Endoscopy;  Laterality: N/A;   FEMORAL-POPLITEAL BYPASS GRAFT Left 04/11/2018   Procedure: BYPASS GRAFT FEMORAL-POPLITEAL ARTERY LEFT LEG;  Surgeon: Rosetta Posner, MD;  Location: [facility] OR;  Service: Vascular;  Laterality: Left;   GIVENS CAPSULE STUDY N/A 07/31/2020   Procedure: GIVENS CAPSULE STUDY;  Surgeon: Doran Stabler, MD;  Location: [facility];  Service: Gastroenterology;  Laterality: N/A;   HOT HEMOSTASIS N/A  08/20/2018   Procedure: HOT HEMOSTASIS (ARGON PLASMA COAGULATION/BICAP);  Surgeon: Gatha Mayer, MD;  Location: [facility] ENDOSCOPY;  Service: Endoscopy;  Laterality: N/A;   HOT HEMOSTASIS N/A 01/17/2020   Procedure: HOT HEMOSTASIS (ARGON PLASMA COAGULATION/BICAP);  Surgeon: Jackquline Denmark, MD;  Location: [facility] ENDOSCOPY;  Service: Endoscopy;  Laterality: N/A;   HOT HEMOSTASIS N/A 01/27/2021   Procedure: HOT HEMOSTASIS (ARGON PLASMA COAGULATION/BICAP);  Surgeon: Yetta Flock, MD;  Location: [facility] ENDOSCOPY;  Service: Gastroenterology;  Laterality: N/A;   LEFT HEART CATH AND  CORONARY ANGIOGRAPHY N/A 03/29/2018   Procedure: LEFT HEART CATH AND CORONARY ANGIOGRAPHY;  Surgeon: Nelva Bush, MD;  Location: [facility];  Service: Cardiovascular;  Laterality: N/A;   LIVER BIOPSY     ORIF ULNAR FRACTURE Left 05/18/2017   Procedure: OPEN REDUCTION INTERNAL FIXATION (ORIF) ULNAR FRACTURE;  Surgeon: Iran Planas, MD;  Location: [facility];  Service: Orthopedics;  Laterality: Left;   PERIPHERAL VASCULAR INTERVENTION Right 06/15/2020   Procedure: PERIPHERAL VASCULAR INTERVENTION;  Surgeon: Waynetta Sandy, MD;  Location: [facility];  Service: Cardiovascular;  Laterality: Right;   POLYPECTOMY  08/21/2018   Procedure: POLYPECTOMY;  Surgeon: Yetta Flock, MD;  Location: [facility];  Service: Gastroenterology;;   Earney Mallet N/A 12/11/2012   Procedure: Earney Mallet;  Surgeon: Serafina Mitchell, MD;  Location: [facility] CATH LAB;  Service: Cardiovascular;  Laterality: N/A;   WOUND EXPLORATION Left 06/15/2020   Procedure: GROIN  EXPLORATION;  Surgeon: Waynetta Sandy, MD;  Location: [facility];  Service: Vascular;  Laterality: Left;       Family History  Problem Relation Age of Onset   Diabetes Father    Hypertension Father    Heart disease Father     Social History   Tobacco Use   Smoking status: Every Day    Packs/day: 0.20    Years: 25.00    Pack years: 5.00    Types: Cigarettes    Last attempt to quit: 08/01/2011    Years since quitting: 9.8   Smokeless tobacco: Never   Tobacco comments:    he smokes 6 cigarettes a week  Vaping Use   Vaping Use: Never used  Substance Use Topics   Alcohol use: Yes    Alcohol/week: 10.0 standard drinks    Types: 10 Glasses of wine per week   Drug use: Not Currently    Types: Cocaine    Home Medications Prior to Admission medications   Medication Sig Start Date End Date Taking? Authorizing Provider  albuterol (VENTOLIN HFA) 108 (90 Base) MCG/ACT inhaler Inhale 2 puffs into the lungs every 4 (four)  hours as needed for shortness of breath or wheezing. 11/06/19   [provider]  amLODipine (NORVASC) 10 MG tablet Take 1 tablet (10 mg total) by mouth daily. 05/26/21   France Ravens, MD  calcitRIOL (ROCALTROL) 0.5 MCG capsule Take 4 capsules (2 mcg total) by mouth Every Tuesday,Thursday,and Saturday with dialysis. 05/27/21   France Ravens, MD  carvedilol (COREG) 6.25 MG tablet Take 1 tablet (6.25 mg total) by mouth 2 (two) times daily with a meal. 05/25/21   France Ravens, MD  cinacalcet (SENSIPAR) 30 MG tablet Take 6 tablets (180 mg total) by mouth Every Tuesday,Thursday,and Saturday with dialysis. 12/12/20   Mercy Riding, MD  Darbepoetin Alfa (ARANESP) 200 MCG/0.4ML SOSY injection Inject 0.4 mLs (200 mcg total) into the vein every Tuesday with hemodialysis. 05/25/21   France Ravens, MD  folic acid (FOLVITE) 1 MG tablet Take 1  tablet (1 mg total) by mouth daily. 12/12/20   Mercy Riding, MD  multivitamin (RENA-VIT) TABS tablet Take 1 tablet by mouth at bedtime. 05/25/21   France Ravens, MD  nicotine (NICODERM CQ - DOSED IN MG/24 HOURS) 14 mg/24hr patch Place 1 patch (14 mg total) onto the skin daily. 12/12/20   Mercy Riding, MD  ramelteon (ROZEREM) 8 MG tablet Take 1 tablet (8 mg total) by mouth at bedtime. 05/25/21 06/24/21  France Ravens, MD  rosuvastatin (CRESTOR) 10 MG tablet Take 1 tablet (10 mg total) by mouth daily. 12/11/20 06/09/21  Mercy Riding, MD  sertraline (ZOLOFT) 50 MG tablet Take 1 tablet (50 mg total) by mouth daily. 05/26/21 06/25/21  France Ravens, MD  sevelamer carbonate (RENVELA) 800 MG tablet Take 4 tablets (3,200 mg total) by mouth 3 (three) times daily with meals. Patient taking differently: Take 4,000 mg by mouth 3 (three) times daily with meals. 09/14/18   Monica Becton, MD  thiamine 100 MG tablet Take 1 tablet (100 mg total) by mouth daily. Patient not taking: Reported on 05/21/2021 12/12/20   Mercy Riding, MD  apixaban (ELIQUIS) 5 MG TABS tablet Take 1 tablet (5 mg total) by mouth 2  (two) times daily. 06/22/20 08/03/20  Karoline Caldwell, PA-C    Allergies    Patient has no known allergies.  Review of Systems   Review of Systems  Constitutional:  Positive for fatigue. Negative for fever.  Eyes:  Negative for visual disturbance (no acute change).  Respiratory:  Negative for cough and shortness of breath.   Cardiovascular:  Negative for chest pain.  Gastrointestinal:  Positive for nausea (chronic takes medications at facility). Negative for abdominal pain and vomiting.  Musculoskeletal:  Negative for back pain and neck stiffness.  Skin:  Negative for rash.  Neurological:  Negative for syncope and headaches.   Physical Exam Updated Vital Signs BP 127/76 (BP Location: Right Arm)   Pulse 76   Temp 98 F (36.7 C) (Oral)   Resp 18   Ht 5\' 8"  (1.727 m)   Wt 66 kg   SpO2 96%   BMI 22.12 kg/m   Physical Exam Vitals and nursing note reviewed.  Constitutional:      General: He is not in acute distress.    Appearance: He is well-developed. He is not diaphoretic.  HENT:     Head: Normocephalic and atraumatic.     Comments: Removed tissue from nose, no remaining tissue, no active bleeding right nares Eyes:     Conjunctiva/sclera: Conjunctivae normal.  Cardiovascular:     Rate and Rhythm: Normal rate and regular rhythm.     Heart sounds: Normal heart sounds. No murmur heard.   No friction rub. No gallop.  Pulmonary:     Effort: Pulmonary effort is normal. No respiratory distress.     Breath sounds: Normal breath sounds. No wheezing or rales.  Abdominal:     General: There is no distension.     Palpations: Abdomen is soft.     Tenderness: There is no abdominal tenderness. There is no guarding.  Musculoskeletal:     Cervical back: Normal range of motion.     Comments: Dialysis fistual with thrill distally  Skin:    General: Skin is warm and dry.  Neurological:     Mental Status: He is alert and oriented to person, place, and time.    ED Results /  Procedures / Treatments   Labs (all labs ordered are listed,  but only abnormal results are displayed) Labs Reviewed  CBC WITH DIFFERENTIAL/PLATELET - Abnormal; Notable for the following components:      Result Value   WBC 3.7 (*)    RBC 2.77 (*)    Hemoglobin 9.0 (*)    HCT 30.7 (*)    MCV 110.8 (*)    MCHC 29.3 (*)    RDW 19.7 (*)    Platelets 128 (*)    Neutro Abs 1.4 (*)    Monocytes Absolute 1.1 (*)    All other components within normal limits  COMPREHENSIVE METABOLIC PANEL - Abnormal; Notable for the following components:   Chloride 96 (*)    BUN 27 (*)    Creatinine, Ser 5.70 (*)    Albumin 3.1 (*)    GFR, Estimated 10 (*)    All other components within normal limits    EKG EKG Interpretation  Date/Time:  Friday May 28 2021 11:39:37 EST Ventricular Rate:  62 PR Interval:  225 QRS Duration: 95 QT Interval:  462 QTC Calculation: 470 R Axis:   29 Text Interpretation: Sinus rhythm Prolonged PR interval RSR' in V1 or V2, probably normal variant Confirmed by Regan Lemming ([id number]) on 05/28/2021 11:43:43 AM  Radiology No results found.  Procedures Procedures   Medications Ordered in ED Medications  oxymetazoline (AFRIN) 0.05 % nasal spray 2 spray (2 sprays Each Nare Given 05/28/21 1132)    ED Course  I have reviewed the triage vital signs and the nursing notes.  Pertinent labs & imaging results that were available during my care of the patient were reviewed by me and considered in my medical decision making (see chart for details).    MDM Rules/Calculators/A&P                            65yo male with history of CAD, COPD, ESRD, hypertension, PAD, DM, HF, GIB, hx of cardiac arrest 9/24, admission for hyperkalemia 11/8, who presents from [place] with nosebleed.  Tissue removed from nose, afrin given, no continued bleeding during period of observation. Did report some fatigue today without other acute symptoms--obtained labs to evaluate for  worsening anemia or other electrolyte abnormaities. No significant changes on labs, discussed epistaxis care and reasons to return.    Final Clinical Impression(s) / ED Diagnoses Final diagnoses:  Right-sided epistaxis    Rx / DC Orders ED Discharge Orders     None        Gareth Morgan, MD 05/28/21 2253

## 2021-05-30 ENCOUNTER — Emergency Department (HOSPITAL_COMMUNITY)
Admission: EM | Admit: 2021-05-30 | Discharge: 2021-05-30 | Disposition: A | Discharge status: Home / Self Care | Payer: 59 | Attending: Emergency Medicine | Admitting: Emergency Medicine

## 2021-05-30 ENCOUNTER — Other Ambulatory Visit: Payer: Self-pay

## 2021-05-30 ENCOUNTER — Encounter (HOSPITAL_COMMUNITY): Payer: Self-pay | Admitting: Emergency Medicine

## 2021-05-30 DIAGNOSIS — F17210 Nicotine dependence, cigarettes, uncomplicated: Secondary | ICD-10-CM | POA: Insufficient documentation

## 2021-05-30 DIAGNOSIS — N186 End stage renal disease: Secondary | ICD-10-CM | POA: Diagnosis not present

## 2021-05-30 DIAGNOSIS — E1122 Type 2 diabetes mellitus with diabetic chronic kidney disease: Secondary | ICD-10-CM | POA: Diagnosis not present

## 2021-05-30 DIAGNOSIS — I2510 Atherosclerotic heart disease of native coronary artery without angina pectoris: Secondary | ICD-10-CM | POA: Diagnosis not present

## 2021-05-30 DIAGNOSIS — Z8616 Personal history of COVID-19: Secondary | ICD-10-CM | POA: Diagnosis not present

## 2021-05-30 DIAGNOSIS — H1032 Unspecified acute conjunctivitis, left eye: Secondary | ICD-10-CM | POA: Diagnosis not present

## 2021-05-30 DIAGNOSIS — I480 Paroxysmal atrial fibrillation: Secondary | ICD-10-CM | POA: Diagnosis not present

## 2021-05-30 DIAGNOSIS — R040 Epistaxis: Secondary | ICD-10-CM | POA: Insufficient documentation

## 2021-05-30 DIAGNOSIS — Z79899 Other long term (current) drug therapy: Secondary | ICD-10-CM | POA: Insufficient documentation

## 2021-05-30 DIAGNOSIS — J449 Chronic obstructive pulmonary disease, unspecified: Secondary | ICD-10-CM | POA: Diagnosis not present

## 2021-05-30 DIAGNOSIS — Z992 Dependence on renal dialysis: Secondary | ICD-10-CM | POA: Insufficient documentation

## 2021-05-30 DIAGNOSIS — Z7901 Long term (current) use of anticoagulants: Secondary | ICD-10-CM | POA: Insufficient documentation

## 2021-05-30 DIAGNOSIS — I120 Hypertensive chronic kidney disease with stage 5 chronic kidney disease or end stage renal disease: Secondary | ICD-10-CM | POA: Insufficient documentation

## 2021-05-30 MED ORDER — ERYTHROMYCIN 5 MG/GM OP OINT
TOPICAL_OINTMENT | Freq: Once | OPHTHALMIC | Status: AC
  Filled 2021-05-30: qty 3.5

## 2021-05-30 MED ORDER — TETRACAINE HCL 0.5 % OP SOLN
2.0000 [drp] | Freq: Once | OPHTHALMIC | Status: AC
  Administered 2021-05-30: 2 [drp] via OPHTHALMIC
  Filled 2021-05-30: qty 4

## 2021-05-30 MED ORDER — ERYTHROMYCIN 5 MG/GM OP OINT: TOPICAL_OINTMENT | OPHTHALMIC | 0 refills | Status: DC

## 2021-05-30 MED ORDER — FLUORESCEIN SODIUM 1 MG OP STRP
1.0000 | ORAL_STRIP | Freq: Once | OPHTHALMIC | Status: AC
  Administered 2021-05-30: 1 via OPHTHALMIC
  Filled 2021-05-30: qty 1

## 2021-05-30 MED ORDER — OXYMETAZOLINE HCL 0.05 % NA SOLN
1.0000 | Freq: Once | NASAL | Status: AC
  Administered 2021-05-30: 1 via NASAL
  Filled 2021-05-30: qty 30

## 2021-05-30 NOTE — ED Triage Notes (Signed)
PT to ER via EMS from [facility] with report of nose bleed this morning that has subsided. Pt was seen her Friday for same.  Pt also reports new redness, tearing and burning of left eye.  Pt noted to have redness of right eye as well.  Pt states this started yesterday.  No bleeding noted at present.  Pt denies other s/s at this time.

## 2021-05-30 NOTE — ED Provider Notes (Signed)
[facility] DEPT Provider Note   CSN: [id number] Arrival date & time: 05/30/21  7510     History Chief Complaint  Patient presents with   Eye Problem   Epistaxis    Raymond [name] is a 65 y.o. male with past medical history significant for anemia, heart disease, COPD, end-stage renal disease on dialysis who presents with 2 problems, a recurrent right-sided nosebleed that started this morning.  Patient was seen 2 days ago for same, which resolved with Afrin alone.  Patient also reports redness, tearing, burning of the left eye as well as blurring vision.  Patient reports that he missed dialysis yesterday due to "feeling sick".  When I ask him how he is feeling he said his eye hurts, and he has the nosebleed but otherwise he feels fine.  Patient has an nausea vomiting bag noted on the bed, does endorse some nausea, but "does not feel sick".  Patient denies chest pain, shortness of breath, injury to the affected eye.   Eye Problem Associated symptoms: redness   Epistaxis     Past Medical History:  Diagnosis Date   Anemia    CAD (coronary artery disease)    a. 03/2018: cath showing 58 to 30% stenosis along the LAD, luminal irregularities along the RCA, and angiographically normal LCx   COPD (chronic obstructive pulmonary disease) (HCC)    ESRD (end stage renal disease) on dialysis ([place])    "MWF; [facility]" (07/22/17)   Hepatitis C    Still positive s/p liver biopsy at [facility]  and interferon therapy for 6 months. Most recent lab work was on 10/24/12   Hepatitis C    "took the tx; gone now" (12/05/2016)  Was treated   History of blood transfusion ~ 2012/2013   "related to my kidneys; blood was low"   Hypertension    Peripheral arterial disease ([place])    Substance abuse ([place])    Thyroid disease    Type 2 diabetes mellitus with other diabetic kidney complication ([place]) 2/58/5277    Patient Active Problem List   Diagnosis Date Noted   Uremia    Severe  episode of recurrent major depressive disorder, without psychotic features ([place])    ESRD (end stage renal disease) on dialysis ([place]) 05/18/2021   Cardiac arrest ([place]) 04/03/2021   Anisocoria 04/03/2021   HFrEF (heart failure with reduced ejection fraction) ([place]) 04/03/2021   Hypothermia not due to cold exposure 04/03/2021   Junctional bradycardia 04/03/2021   Pancytopenia ([place]) 01/25/2021   AMS (altered mental status) 01/19/2021   Chronic viral hepatitis C ([place]) 12/25/2020   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 ([place]) 08/05/2020   Acute upper GI bleed 82/42/3536   Acute metabolic encephalopathy 14/43/1540   COVID-19 virus infection 07/29/2020   AVM (arteriovenous malformation) of stomach, acquired with hemorrhage    AVM (arteriovenous malformation) of small bowel, acquired with hemorrhage    Current use of long term anticoagulation    Sensorineural hearing loss (SNHL) of both ears 03/30/2020   History of arteriovenous malformation (AVM) 01/14/2020   UGIB (upper gastrointestinal bleed) 01/14/2020   Acute blood loss anemia 01/14/2020   Elevated troponin 01/14/2020   Allergy, unspecified, initial encounter 04/29/2019   Gastroenteritis 12/24/2018   Substance use disorder 12/24/2018   Type 2 diabetes mellitus with other diabetic kidney complication ([place]) 08/67/6195   Noncompliance of patient with renal dialysis ([place]) 09/12/2018   Benign neoplasm of colon    Melena    Anemia  due to chronic kidney disease, on chronic dialysis ([place])    Anemia    H/O medication noncompliance    Polysubstance (excluding opioids) dependence (HCC)    Atrial flutter with rapid ventricular response ([place]) 08/10/2018   PAF (paroxysmal atrial fibrillation) ([place]) 08/01/2018   Acute respiratory failure with hypoxia ([place]) 07/13/2018   Non-compliance with renal dialysis ([place]) 07/13/2018   PAD (peripheral artery disease) ([place]) 04/11/2018   Sepsis ([place]) 03/31/2018   Preop cardiovascular exam 03/29/2018    Idiopathic gout, unspecified site 12/13/2017   Other specified complication of vascular prosthetic devices, implants and grafts, initial encounter ([place]) 11/03/2017   Volume overload 08/07/2017   Acute pulmonary edema (HCC)    Dyspnea 06/20/2017   Acute bronchitis    COPD (chronic obstructive pulmonary disease) ([place])    ESRD on hemodialysis ([place])    Hypoxia 12/05/2016   Hyperkalemia 12/19/2015   Hypoglycemia 12/19/2015   Polysubstance abuse ([place]) 12/19/2015   Homeless 12/19/2015   Anemia associated with stage 5 chronic renal failure ([place]) 12/19/2015   BPH without urinary obstruction 10/29/2015   Erectile dysfunction 10/29/2015   Hematuria, gross 10/29/2015   Hypercalcemia 11/07/2014   Pruritus, unspecified 10/17/2014   Coagulation defect, unspecified ([place]) 09/17/2014   Atypical chest pain 12/19/2013   Thrombocytopenia, unspecified ([place]) 10/17/2013   Iron deficiency anemia, unspecified 08/28/2013   Atherosclerosis of native arteries of extremity with intermittent claudication ([place]) 12/04/2012   ESRD (end stage renal disease) ([place]) 12/04/2012   Hypertensive chronic kidney disease with stage 5 chronic kidney disease or end stage renal disease ([place]) 11/20/2012   Secondary hyperparathyroidism of renal origin ([place]) 11/20/2012   HEPATITIS C 09/07/2006   HYPERCHOLESTEROLEMIA 09/07/2006   HYPERTENSION, BENIGN SYSTEMIC 09/07/2006    Past Surgical History:  Procedure Laterality Date   ABDOMINAL AORTOGRAM W/LOWER EXTREMITY N/A 02/08/2018   Procedure: ABDOMINAL AORTOGRAM W/LOWER EXTREMITY;  Surgeon: Conrad Westcliffe, MD;  Location: [facility];  Service: Cardiovascular;  Laterality: N/A;   ABDOMINAL AORTOGRAM W/LOWER EXTREMITY Bilateral 06/15/2020   Procedure: ABDOMINAL AORTOGRAM W/LOWER EXTREMITY;  Surgeon: Waynetta Sandy, MD;  Location: [facility];  Service: Cardiovascular;  Laterality: Bilateral;   AV FISTULA PLACEMENT Left Aug. 2013 ?   BIOPSY  01/17/2020    Procedure: BIOPSY;  Surgeon: Jackquline Denmark, MD;  Location: [facility];  Service: Endoscopy;;   COLONOSCOPY WITH PROPOFOL N/A 08/21/2018   Procedure: COLONOSCOPY WITH PROPOFOL;  Surgeon: Yetta Flock, MD;  Location: [facility];  Service: Gastroenterology;  Laterality: N/A;   ENTEROSCOPY N/A 01/17/2020   Procedure: ENTEROSCOPY;  Surgeon: Jackquline Denmark, MD;  Location: [facility] ENDOSCOPY;  Service: Endoscopy;  Laterality: N/A;   ENTEROSCOPY N/A 07/31/2020   Procedure: ENTEROSCOPY;  Surgeon: Doran Stabler, MD;  Location: [facility];  Service: Gastroenterology;  Laterality: N/A;   ENTEROSCOPY N/A 01/27/2021   Procedure: ENTEROSCOPY;  Surgeon: Yetta Flock, MD;  Location: [facility] ENDOSCOPY;  Service: Gastroenterology;  Laterality: N/A;   ESOPHAGOGASTRODUODENOSCOPY (EGD) WITH PROPOFOL N/A 08/20/2018   Procedure: ESOPHAGOGASTRODUODENOSCOPY (EGD) WITH PROPOFOL;  Surgeon: Gatha Mayer, MD;  Location: [facility];  Service: Endoscopy;  Laterality: N/A;   FEMORAL-POPLITEAL BYPASS GRAFT Left 04/11/2018   Procedure: BYPASS GRAFT FEMORAL-POPLITEAL ARTERY LEFT LEG;  Surgeon: Rosetta Posner, MD;  Location: [facility] OR;  Service: Vascular;  Laterality: Left;   GIVENS CAPSULE STUDY N/A 07/31/2020   Procedure: GIVENS CAPSULE STUDY;  Surgeon: Doran Stabler, MD;  Location: [facility];  Service: Gastroenterology;  Laterality: N/A;  HOT HEMOSTASIS N/A 08/20/2018   Procedure: HOT HEMOSTASIS (ARGON PLASMA COAGULATION/BICAP);  Surgeon: Gatha Mayer, MD;  Location: [facility] ENDOSCOPY;  Service: Endoscopy;  Laterality: N/A;   HOT HEMOSTASIS N/A 01/17/2020   Procedure: HOT HEMOSTASIS (ARGON PLASMA COAGULATION/BICAP);  Surgeon: Jackquline Denmark, MD;  Location: [facility] ENDOSCOPY;  Service: Endoscopy;  Laterality: N/A;   HOT HEMOSTASIS N/A 01/27/2021   Procedure: HOT HEMOSTASIS (ARGON PLASMA COAGULATION/BICAP);  Surgeon: Yetta Flock, MD;  Location: [facility] ENDOSCOPY;  Service: Gastroenterology;  Laterality: N/A;   LEFT HEART  CATH AND CORONARY ANGIOGRAPHY N/A 03/29/2018   Procedure: LEFT HEART CATH AND CORONARY ANGIOGRAPHY;  Surgeon: Nelva Bush, MD;  Location: [facility];  Service: Cardiovascular;  Laterality: N/A;   LIVER BIOPSY     ORIF ULNAR FRACTURE Left 05/18/2017   Procedure: OPEN REDUCTION INTERNAL FIXATION (ORIF) ULNAR FRACTURE;  Surgeon: Iran Planas, MD;  Location: [facility];  Service: Orthopedics;  Laterality: Left;   PERIPHERAL VASCULAR INTERVENTION Right 06/15/2020   Procedure: PERIPHERAL VASCULAR INTERVENTION;  Surgeon: Waynetta Sandy, MD;  Location: [facility];  Service: Cardiovascular;  Laterality: Right;   POLYPECTOMY  08/21/2018   Procedure: POLYPECTOMY;  Surgeon: Yetta Flock, MD;  Location: [facility];  Service: Gastroenterology;;   Earney Mallet N/A 12/11/2012   Procedure: Earney Mallet;  Surgeon: Serafina Mitchell, MD;  Location: [facility] CATH LAB;  Service: Cardiovascular;  Laterality: N/A;   WOUND EXPLORATION Left 06/15/2020   Procedure: GROIN  EXPLORATION;  Surgeon: Waynetta Sandy, MD;  Location: [facility];  Service: Vascular;  Laterality: Left;       Family History  Problem Relation Age of Onset   Diabetes Father    Hypertension Father    Heart disease Father     Social History   Tobacco Use   Smoking status: Every Day    Packs/day: 0.20    Years: 25.00    Pack years: 5.00    Types: Cigarettes    Last attempt to quit: 08/01/2011    Years since quitting: 9.8   Smokeless tobacco: Never   Tobacco comments:    he smokes 6 cigarettes a week  Vaping Use   Vaping Use: Never used  Substance Use Topics   Alcohol use: Yes    Alcohol/week: 10.0 standard drinks    Types: 10 Glasses of wine per week   Drug use: Not Currently    Types: Cocaine    Home Medications Prior to Admission medications   Medication Sig Start Date End Date Taking? Authorizing Provider  albuterol (VENTOLIN HFA) 108 (90 Base) MCG/ACT inhaler Inhale 2 puffs into the lungs every 4  (four) hours as needed for shortness of breath or wheezing. 11/06/19   [provider]  amLODipine (NORVASC) 10 MG tablet Take 1 tablet (10 mg total) by mouth daily. 05/26/21   France Ravens, MD  calcitRIOL (ROCALTROL) 0.5 MCG capsule Take 4 capsules (2 mcg total) by mouth Every Tuesday,Thursday,and Saturday with dialysis. 05/27/21   France Ravens, MD  carvedilol (COREG) 6.25 MG tablet Take 1 tablet (6.25 mg total) by mouth 2 (two) times daily with a meal. 05/25/21   France Ravens, MD  cinacalcet (SENSIPAR) 30 MG tablet Take 6 tablets (180 mg total) by mouth Every Tuesday,Thursday,and Saturday with dialysis. 12/12/20   Mercy Riding, MD  Darbepoetin Alfa (ARANESP) 200 MCG/0.4ML SOSY injection Inject 0.4 mLs (200 mcg total) into the vein every Tuesday with hemodialysis. 05/25/21   France Ravens, MD  folic acid (FOLVITE) 1 MG  tablet Take 1 tablet (1 mg total) by mouth daily. 12/12/20   Mercy Riding, MD  multivitamin (RENA-VIT) TABS tablet Take 1 tablet by mouth at bedtime. 05/25/21   France Ravens, MD  nicotine (NICODERM CQ - DOSED IN MG/24 HOURS) 14 mg/24hr patch Place 1 patch (14 mg total) onto the skin daily. 12/12/20   Mercy Riding, MD  ramelteon (ROZEREM) 8 MG tablet Take 1 tablet (8 mg total) by mouth at bedtime. 05/25/21 06/24/21  France Ravens, MD  rosuvastatin (CRESTOR) 10 MG tablet Take 1 tablet (10 mg total) by mouth daily. 12/11/20 06/09/21  Mercy Riding, MD  sertraline (ZOLOFT) 50 MG tablet Take 1 tablet (50 mg total) by mouth daily. 05/26/21 06/25/21  France Ravens, MD  sevelamer carbonate (RENVELA) 800 MG tablet Take 4 tablets (3,200 mg total) by mouth 3 (three) times daily with meals. Patient taking differently: Take 4,000 mg by mouth 3 (three) times daily with meals. 09/14/18   Monica Becton, MD  thiamine 100 MG tablet Take 1 tablet (100 mg total) by mouth daily. Patient not taking: Reported on 05/21/2021 12/12/20   Mercy Riding, MD  apixaban (ELIQUIS) 5 MG TABS tablet Take 1 tablet (5 mg total) by  mouth 2 (two) times daily. 06/22/20 08/03/20  Karoline Caldwell, PA-C    Allergies    Patient has no known allergies.  Review of Systems   Review of Systems  HENT:  Positive for nosebleeds.   Eyes:  Positive for pain and redness.  All other systems reviewed and are negative.  Physical Exam Updated Vital Signs BP (!) 152/87   Pulse 63   Temp 97.7 F (36.5 C) (Oral)   Resp 18   Ht 5\' 8"  (1.727 m)   Wt 66 kg   SpO2 97%   BMI 22.12 kg/m   Physical Exam Vitals and nursing note reviewed.  Constitutional:      General: He is not in acute distress.    Appearance: Normal appearance.  HENT:     Head: Normocephalic and atraumatic.  Eyes:     General:        Right eye: No discharge.        Left eye: Discharge present.    Comments: Pupils reactive, EOMs intact.  There is some bilateral redness, corneal haziness, arcus senilis.  There is no significant difference of the left eye compared to the right other than clear exudate from inner corner of left eye.  No evidence of cobblestone mucosa, redness or other evidence of periorbital or preseptal cellulitis of the left eye.  Intraocular pressure 12 mmHg bilaterally. No fluoroscein dye uptake OS  Cardiovascular:     Rate and Rhythm: Normal rate and regular rhythm.     Heart sounds: No murmur heard.   No friction rub. No gallop.  Pulmonary:     Effort: Pulmonary effort is normal.     Breath sounds: Normal breath sounds.  Abdominal:     General: Bowel sounds are normal.     Palpations: Abdomen is soft.  Skin:    General: Skin is warm and dry.     Capillary Refill: Capillary refill takes less than 2 seconds.  Neurological:     Mental Status: He is alert and oriented to person, place, and time.  Psychiatric:        Mood and Affect: Mood normal.        Behavior: Behavior normal.    ED Results / Procedures / Treatments   Labs (  all labs ordered are listed, but only abnormal results are displayed) Labs Reviewed - No data to  display  EKG None  Radiology No results found.  Procedures Procedures   Medications Ordered in ED Medications  fluorescein ophthalmic strip 1 strip (1 strip Left Eye Given 05/30/21 1121)  tetracaine (PONTOCAINE) 0.5 % ophthalmic solution 2 drop (2 drops Left Eye Given 05/30/21 1121)  oxymetazoline (AFRIN) 0.05 % nasal spray 1 spray (1 spray Each Nare Given 05/30/21 1219)    ED Course  I have reviewed the triage vital signs and the nursing notes.  Pertinent labs & imaging results that were available during my care of the patient were reviewed by me and considered in my medical decision making (see chart for details).    MDM Rules/Calculators/A&P                         I discussed this case with my attending physician who cosigned this note including patient's presenting symptoms, physical exam, and planned diagnostics and interventions. Attending physician stated agreement with plan or made changes to plan which were implemented.   Attending physician assessed patient at bedside.  Epistaxis resolved without intervention.  Continues to remain resolved for duration of patient's visit.  High-pressure normal, bilateral eyes with significant arcus senilis, haziness, scleral redness, left eye not significantly different than right eye.  Given that there is some significant pain of the left eye compared to the right we will check extraocular pressures, check for corneal abrasion, and perform visual acuity screening.  Bilateral eye pressures are 11 mmHg, no evidence of corneal abrasion or other injury with fluorescein dye.  Patient did miss dialysis yesterday, denies any complaints of chest pain, shortness of breath, increased fatigue, or other concerns secondary to missing dialysis.  Patient is somewhat somnolent on my exam, without any clear changes on physical exam.  Patient not nearly as somnolent for attending.  Patient declines blood work at this time.  We will treat as presumptive  early conjunctivitis, and cover for bacteria.  Will prescribe erythromycin drops.  Encouraged lubricating drops additionally.  Patient discharged in stable condition at this time, return precautions given. Final Clinical Impression(s) / ED Diagnoses Final diagnoses:  Epistaxis  Acute conjunctivitis of left eye, unspecified acute conjunctivitis type    Rx / DC Orders ED Discharge Orders     None        Anselmo Pickler, PA-C 05/30/21 1340    Sherwood Gambler, MD 05/31/21 1511

## 2021-05-30 NOTE — ED Notes (Signed)
Attempted to call report, tech states she will give message to nurses.

## 2021-05-30 NOTE — Discharge Instructions (Addendum)
Please use the antibiotic ointment to the affected eye as directed until eye pain resolved. Please return to dialysis as scheduled on Tuesday. Please return if anything changes or worsens in the meantime.  You can use lubricating eyedrops in addition to the antibiotic ointment for the eye pain.

## 2021-06-21 ENCOUNTER — Ambulatory Visit (INDEPENDENT_AMBULATORY_CARE_PROVIDER_SITE_OTHER): Payer: Medicaid Other | Admitting: Podiatry

## 2021-06-21 DIAGNOSIS — Z91199 Patient's noncompliance with other medical treatment and regimen due to unspecified reason: Secondary | ICD-10-CM

## 2021-06-25 NOTE — Progress Notes (Signed)
No show for apointment

## 2021-07-21 ENCOUNTER — Ambulatory Visit: Payer: 59 | Admitting: Nurse Practitioner

## 2021-07-21 NOTE — Progress Notes (Deleted)
Cardiology Office Note:    Date:  07/21/2021   ID:  Raymond [name], DOB 21-Feb-1956, MRN [id number]  PCP:  [name], FNP   [facility] Providers Cardiologist:  Jenkins Rouge, MD { Click to update primary MD,subspecialty MD or APP then REFRESH:1}    Referring MD: [name], FNP   No chief complaint on file. ***  History of Present Illness:    Raymond [name] is a 66 y.o. male with a hx of chronic HFrEF, atrial flutter, PAF, HTN, COPD, ESRD on HD, who established care with our group in 2015. He had abnormal EKG 8/14 with diffuse J point elevation on EKG. Exercise tolerance test was recommended but he was lost in follow-up.   In 2019, he was admitted and underwent cardiac catheterization which revealed mild non-obstructive CAD, normal LV systolic function with upper normal left ventricular filling pressure. Medical therapy and risk factor modification recommended. He was cleared for upcoming fem-pop bypass with vascular surgery  He was admitted to [facility] ED following cardiac arrest with ROSC in the field. EMS arrived to find the patient somnolent with altered mental status. He was transported to the ambulance where he subsequently lost his pulse and required ACLS. ROSC was achieved in route. Upon arrival to ED, he was bradycardic and hypothermic. He was very hypoglycemic with EKG revealing junctional rhythm. His lab work showed hyperkalemia and CT of head was without acute changes. He was admitted to ICU   He was last seen in our office on 05/15/2018 by Cecilie Kicks, NP   Past Medical History:  Diagnosis Date   Anemia    CAD (coronary artery disease)    a. 03/2018: cath showing 20 to 30% stenosis along the LAD, luminal irregularities along the RCA, and angiographically normal LCx   COPD (chronic obstructive pulmonary disease) (HCC)    ESRD (end stage renal disease) on dialysis [facility])    "MWF; Jeneen Rinks" (07/22/17)   Hepatitis C    Still positive s/p liver biopsy at [facility]   and interferon therapy for 6 months. Most recent lab work was on 10/24/12   Hepatitis C    "took the tx; gone now" (12/05/2016)  Was treated   History of blood transfusion ~ 2012/2013   "related to my kidneys; blood was low"   Hypertension    Peripheral arterial disease ([place])    Substance abuse ([place])    Thyroid disease    Type 2 diabetes mellitus with other diabetic kidney complication ([place]) 2/70/3500    Past Surgical History:  Procedure Laterality Date   ABDOMINAL AORTOGRAM W/LOWER EXTREMITY N/A 02/08/2018   Procedure: ABDOMINAL AORTOGRAM W/LOWER EXTREMITY;  Surgeon: Conrad North Star, MD;  Location: [place] CV LAB;  Service: Cardiovascular;  Laterality: N/A;   ABDOMINAL AORTOGRAM W/LOWER EXTREMITY Bilateral 06/15/2020   Procedure: ABDOMINAL AORTOGRAM W/LOWER EXTREMITY;  Surgeon: Waynetta Sandy, MD;  Location: [place] CV LAB;  Service: Cardiovascular;  Laterality: Bilateral;   AV FISTULA PLACEMENT Left Aug. 2013 ?   BIOPSY  01/17/2020   Procedure: BIOPSY;  Surgeon: Jackquline Denmark, MD;  Location: [place];  Service: Endoscopy;;   COLONOSCOPY WITH PROPOFOL N/A 08/21/2018   Procedure: COLONOSCOPY WITH PROPOFOL;  Surgeon: Yetta Flock, MD;  Location: [place];  Service: Gastroenterology;  Laterality: N/A;   ENTEROSCOPY N/A 01/17/2020   Procedure: ENTEROSCOPY;  Surgeon: Jackquline Denmark, MD;  Location: [facility] ENDOSCOPY;  Service: Endoscopy;  Laterality: N/A;   ENTEROSCOPY N/A 07/31/2020   Procedure:  ENTEROSCOPY;  Surgeon: Doran Stabler, MD;  Location: [place];  Service: Gastroenterology;  Laterality: N/A;   ENTEROSCOPY N/A 01/27/2021   Procedure: ENTEROSCOPY;  Surgeon: Yetta Flock, MD;  Location: [facility] ENDOSCOPY;  Service: Gastroenterology;  Laterality: N/A;   ESOPHAGOGASTRODUODENOSCOPY (EGD) WITH PROPOFOL N/A 08/20/2018   Procedure: ESOPHAGOGASTRODUODENOSCOPY (EGD) WITH PROPOFOL;  Surgeon: Gatha Mayer, MD;  Location: [place];  Service: Endoscopy;   Laterality: N/A;   FEMORAL-POPLITEAL BYPASS GRAFT Left 04/11/2018   Procedure: BYPASS GRAFT FEMORAL-POPLITEAL ARTERY LEFT LEG;  Surgeon: Rosetta Posner, MD;  Location: [facility] OR;  Service: Vascular;  Laterality: Left;   GIVENS CAPSULE STUDY N/A 07/31/2020   Procedure: GIVENS CAPSULE STUDY;  Surgeon: Doran Stabler, MD;  Location: [place];  Service: Gastroenterology;  Laterality: N/A;   HOT HEMOSTASIS N/A 08/20/2018   Procedure: HOT HEMOSTASIS (ARGON PLASMA COAGULATION/BICAP);  Surgeon: Gatha Mayer, MD;  Location: [facility] ENDOSCOPY;  Service: Endoscopy;  Laterality: N/A;   HOT HEMOSTASIS N/A 01/17/2020   Procedure: HOT HEMOSTASIS (ARGON PLASMA COAGULATION/BICAP);  Surgeon: Jackquline Denmark, MD;  Location: [facility] ENDOSCOPY;  Service: Endoscopy;  Laterality: N/A;   HOT HEMOSTASIS N/A 01/27/2021   Procedure: HOT HEMOSTASIS (ARGON PLASMA COAGULATION/BICAP);  Surgeon: Yetta Flock, MD;  Location: [facility] ENDOSCOPY;  Service: Gastroenterology;  Laterality: N/A;   LEFT HEART CATH AND CORONARY ANGIOGRAPHY N/A 03/29/2018   Procedure: LEFT HEART CATH AND CORONARY ANGIOGRAPHY;  Surgeon: Nelva Bush, MD;  Location: [place] CV LAB;  Service: Cardiovascular;  Laterality: N/A;   LIVER BIOPSY     ORIF ULNAR FRACTURE Left 05/18/2017   Procedure: OPEN REDUCTION INTERNAL FIXATION (ORIF) ULNAR FRACTURE;  Surgeon: Iran Planas, MD;  Location: [place];  Service: Orthopedics;  Laterality: Left;   PERIPHERAL VASCULAR INTERVENTION Right 06/15/2020   Procedure: PERIPHERAL VASCULAR INTERVENTION;  Surgeon: Waynetta Sandy, MD;  Location: [place] CV LAB;  Service: Cardiovascular;  Laterality: Right;   POLYPECTOMY  08/21/2018   Procedure: POLYPECTOMY;  Surgeon: Yetta Flock, MD;  Location: [place];  Service: Gastroenterology;;   Earney Mallet N/A 12/11/2012   Procedure: Earney Mallet;  Surgeon: Serafina Mitchell, MD;  Location: [facility] CATH LAB;  Service: Cardiovascular;  Laterality: N/A;   WOUND EXPLORATION Left  06/15/2020   Procedure: GROIN  EXPLORATION;  Surgeon: Waynetta Sandy, MD;  Location: [facility] OR;  Service: Vascular;  Laterality: Left;    Current Medications: No outpatient medications have been marked as taking for the 07/21/21 encounter (Appointment) with Ann Maki, Lanice Schwab, NP.     Allergies:   Patient has no known allergies.   Social History   Socioeconomic History   Marital status: Single    Spouse name: Not on file   Number of children: 2   Years of education: Not on file   Highest education level: 11th grade  Occupational History   Not on file  Tobacco Use   Smoking status: Every Day    Packs/day: 0.20    Years: 25.00    Pack years: 5.00    Types: Cigarettes    Last attempt to quit: 08/01/2011    Years since quitting: 9.9   Smokeless tobacco: Never   Tobacco comments:    he smokes 6 cigarettes a week  Vaping Use   Vaping Use: Never used  Substance and Sexual Activity   Alcohol use: Yes    Alcohol/week: 10.0 standard drinks    Types: 10 Glasses of wine per week   Drug use: Not Currently  Types: Cocaine   Sexual activity: Not on file  Other Topics Concern   Not on file  Social History Narrative   Patient is living in a apartment alone. Professional cement finisher. Has 2 children (68yo & 61yo sons). Plans to make HCPOA for sister Neoma Laming to make decisions for him should he need them.    Social Determinants of Health   Financial Resource Strain: Not on file  Food Insecurity: Not on file  Transportation Needs: Not on file  Physical Activity: Not on file  Stress: Not on file  Social Connections: Not on file     Family History: The patient's ***family history includes Diabetes in his father; Heart disease in his father; Hypertension in his father.  ROS:   Please see the history of present illness.    *** All other systems reviewed and are negative.  Labs/Other Studies Reviewed:    The following studies were reviewed today: ***  Recent  Labs: 04/03/2021: TSH 4.085 05/18/2021: Magnesium 2.7 05/28/2021: ALT 17; BUN 27; Creatinine, Ser 5.70; Hemoglobin 9.0; Platelets 128; Potassium 4.0; Sodium 136  Recent Lipid Panel    Component Value Date/Time   CHOL 158 03/27/2018 1420   TRIG 649 (H) 04/04/2021 0500   HDL 74 03/27/2018 1420   CHOLHDL 2.1 03/27/2018 1420   CHOLHDL 2.0 Ratio 10/04/2007 2023   VLDL 31 10/04/2007 2023   LDLCALC 69 03/27/2018 1420     Risk Assessment/Calculations:   {Does this patient have ATRIAL FIBRILLATION?:[phone]}       Physical Exam:    VS:  There were no vitals taken for this visit.    Wt Readings from Last 3 Encounters:  05/30/21 145 lb 8.1 oz (66 kg)  05/28/21 145 lb 8.1 oz (66 kg)  05/25/21 141 lb 1.5 oz (64 kg)     GEN: *** Well nourished, well developed in no acute distress HEENT: Normal NECK: No JVD; No carotid bruits LYMPHATICS: No lymphadenopathy CARDIAC: ***RRR, no murmurs, rubs, gallops RESPIRATORY:  Clear to auscultation without rales, wheezing or rhonchi  ABDOMEN: Soft, non-tender, non-distended MUSCULOSKELETAL:  No edema; No deformity  SKIN: Warm and dry NEUROLOGIC:  Alert and oriented x 3 PSYCHIATRIC:  Normal affect   EKG:  EKG is *** ordered today.  The ekg ordered today demonstrates ***  Diagnoses:    No diagnosis found. Assessment and Plan:     ***      {Are you ordering a CV Procedure (e.g. stress test, cath, DCCV, TEE, etc)?   Press F2        :[id number]}    Medication Adjustments/Labs and Tests Ordered: Current medicines are reviewed at length with the patient today.  Concerns regarding medicines are outlined above.  No orders of the defined types were placed in this encounter.  No orders of the defined types were placed in this encounter.   There are no Patient Instructions on file for this visit.   Signed, Emmaline Life, NP  07/21/2021 8:50 AM    [place]

## 2021-09-13 ENCOUNTER — Encounter (HOSPITAL_COMMUNITY): Payer: Self-pay | Admitting: Emergency Medicine

## 2021-09-13 ENCOUNTER — Other Ambulatory Visit: Payer: Self-pay

## 2021-09-13 ENCOUNTER — Inpatient Hospital Stay (HOSPITAL_COMMUNITY)
Admission: EM | Admit: 2021-09-13 | Discharge: 2021-09-17 | DRG: 682 | Disposition: A | Discharge status: Skilled Nursing Facility | Payer: 59 | Attending: Internal Medicine | Admitting: Internal Medicine

## 2021-09-13 ENCOUNTER — Emergency Department (HOSPITAL_COMMUNITY): Payer: 59

## 2021-09-13 DIAGNOSIS — E1122 Type 2 diabetes mellitus with diabetic chronic kidney disease: Secondary | ICD-10-CM | POA: Diagnosis present

## 2021-09-13 DIAGNOSIS — D696 Thrombocytopenia, unspecified: Secondary | ICD-10-CM

## 2021-09-13 DIAGNOSIS — F17210 Nicotine dependence, cigarettes, uncomplicated: Secondary | ICD-10-CM | POA: Diagnosis present

## 2021-09-13 DIAGNOSIS — D631 Anemia in chronic kidney disease: Secondary | ICD-10-CM | POA: Diagnosis present

## 2021-09-13 DIAGNOSIS — E7800 Pure hypercholesterolemia, unspecified: Secondary | ICD-10-CM

## 2021-09-13 DIAGNOSIS — F332 Major depressive disorder, recurrent severe without psychotic features: Secondary | ICD-10-CM | POA: Diagnosis present

## 2021-09-13 DIAGNOSIS — F1420 Cocaine dependence, uncomplicated: Secondary | ICD-10-CM | POA: Diagnosis present

## 2021-09-13 DIAGNOSIS — E1151 Type 2 diabetes mellitus with diabetic peripheral angiopathy without gangrene: Secondary | ICD-10-CM | POA: Diagnosis present

## 2021-09-13 DIAGNOSIS — M898X9 Other specified disorders of bone, unspecified site: Secondary | ICD-10-CM | POA: Diagnosis present

## 2021-09-13 DIAGNOSIS — E8770 Fluid overload, unspecified: Secondary | ICD-10-CM | POA: Diagnosis present

## 2021-09-13 DIAGNOSIS — I480 Paroxysmal atrial fibrillation: Secondary | ICD-10-CM | POA: Diagnosis present

## 2021-09-13 DIAGNOSIS — I739 Peripheral vascular disease, unspecified: Secondary | ICD-10-CM | POA: Diagnosis present

## 2021-09-13 DIAGNOSIS — I120 Hypertensive chronic kidney disease with stage 5 chronic kidney disease or end stage renal disease: Principal | ICD-10-CM | POA: Diagnosis present

## 2021-09-13 DIAGNOSIS — N19 Unspecified kidney failure: Secondary | ICD-10-CM

## 2021-09-13 DIAGNOSIS — F1410 Cocaine abuse, uncomplicated: Secondary | ICD-10-CM

## 2021-09-13 DIAGNOSIS — I160 Hypertensive urgency: Secondary | ICD-10-CM | POA: Diagnosis present

## 2021-09-13 DIAGNOSIS — R079 Chest pain, unspecified: Secondary | ICD-10-CM

## 2021-09-13 DIAGNOSIS — R0602 Shortness of breath: Secondary | ICD-10-CM | POA: Diagnosis not present

## 2021-09-13 DIAGNOSIS — K921 Melena: Secondary | ICD-10-CM | POA: Diagnosis present

## 2021-09-13 DIAGNOSIS — Z9115 Patient's noncompliance with renal dialysis: Secondary | ICD-10-CM | POA: Diagnosis not present

## 2021-09-13 DIAGNOSIS — Z7901 Long term (current) use of anticoagulants: Secondary | ICD-10-CM

## 2021-09-13 DIAGNOSIS — J449 Chronic obstructive pulmonary disease, unspecified: Secondary | ICD-10-CM

## 2021-09-13 DIAGNOSIS — Z992 Dependence on renal dialysis: Secondary | ICD-10-CM

## 2021-09-13 DIAGNOSIS — I2510 Atherosclerotic heart disease of native coronary artery without angina pectoris: Secondary | ICD-10-CM | POA: Diagnosis present

## 2021-09-13 DIAGNOSIS — E1129 Type 2 diabetes mellitus with other diabetic kidney complication: Secondary | ICD-10-CM | POA: Diagnosis not present

## 2021-09-13 DIAGNOSIS — Z79899 Other long term (current) drug therapy: Secondary | ICD-10-CM

## 2021-09-13 DIAGNOSIS — I10 Essential (primary) hypertension: Secondary | ICD-10-CM | POA: Diagnosis not present

## 2021-09-13 DIAGNOSIS — Z20822 Contact with and (suspected) exposure to covid-19: Secondary | ICD-10-CM | POA: Diagnosis present

## 2021-09-13 DIAGNOSIS — N185 Chronic kidney disease, stage 5: Secondary | ICD-10-CM

## 2021-09-13 DIAGNOSIS — D61818 Other pancytopenia: Secondary | ICD-10-CM | POA: Diagnosis present

## 2021-09-13 DIAGNOSIS — R0600 Dyspnea, unspecified: Secondary | ICD-10-CM | POA: Diagnosis present

## 2021-09-13 DIAGNOSIS — N189 Chronic kidney disease, unspecified: Secondary | ICD-10-CM

## 2021-09-13 DIAGNOSIS — F1920 Other psychoactive substance dependence, uncomplicated: Secondary | ICD-10-CM | POA: Diagnosis not present

## 2021-09-13 DIAGNOSIS — N186 End stage renal disease: Secondary | ICD-10-CM | POA: Diagnosis present

## 2021-09-13 DIAGNOSIS — Z91158 Patient's noncompliance with renal dialysis for other reason: Secondary | ICD-10-CM

## 2021-09-13 LAB — RESP PANEL BY RT-PCR (FLU A&B, COVID) ARPGX2
Influenza A by PCR: NEGATIVE
Influenza B by PCR: NEGATIVE
SARS Coronavirus 2 by RT PCR: NEGATIVE

## 2021-09-13 LAB — BASIC METABOLIC PANEL
Anion gap: 21 — ABNORMAL HIGH (ref 5–15)
BUN: 102 mg/dL — ABNORMAL HIGH (ref 8–23)
CO2: 27 mmol/L (ref 22–32)
Calcium: 10.1 mg/dL (ref 8.9–10.3)
Chloride: 92 mmol/L — ABNORMAL LOW (ref 98–111)
Creatinine, Ser: 19.45 mg/dL — ABNORMAL HIGH (ref 0.61–1.24)
GFR, Estimated: 2 mL/min — ABNORMAL LOW (ref >60)
Glucose, Bld: 111 mg/dL — ABNORMAL HIGH (ref 70–99)
Potassium: 4.9 mmol/L (ref 3.5–5.1)
Sodium: 140 mmol/L (ref 135–145)

## 2021-09-13 LAB — CBC
HCT: 23.8 % — ABNORMAL LOW (ref 39.0–52.0)
Hemoglobin: 7.6 g/dL — ABNORMAL LOW (ref 13.0–17.0)
MCH: 32.3 pg (ref 26.0–34.0)
MCHC: 31.9 g/dL (ref 30.0–36.0)
MCV: 101.3 fL — ABNORMAL HIGH (ref 80.0–100.0)
Platelets: 173 10*3/uL (ref 150–400)
RBC: 2.35 MIL/uL — ABNORMAL LOW (ref 4.22–5.81)
RDW: 19.3 % — ABNORMAL HIGH (ref 11.5–15.5)
WBC: 3.1 10*3/uL — ABNORMAL LOW (ref 4.0–10.5)
nRBC: 0 % (ref 0.0–0.2)

## 2021-09-13 LAB — ETHANOL: Alcohol, Ethyl (B): <10 mg/dL (ref <10)

## 2021-09-13 LAB — TROPONIN I (HIGH SENSITIVITY)
Troponin I (High Sensitivity): 42 ng/L — ABNORMAL HIGH (ref <18)
Troponin I (High Sensitivity): 41 ng/L — ABNORMAL HIGH (ref <18)

## 2021-09-13 IMAGING — DX DG CHEST 2V
2 series · 2 of 2 positions shown · non-contrast
Comparison: Radiograph [DATE]

CLINICAL DATA: Chest pain

EXAM:
CHEST - 2 VIEW

[chest pa]
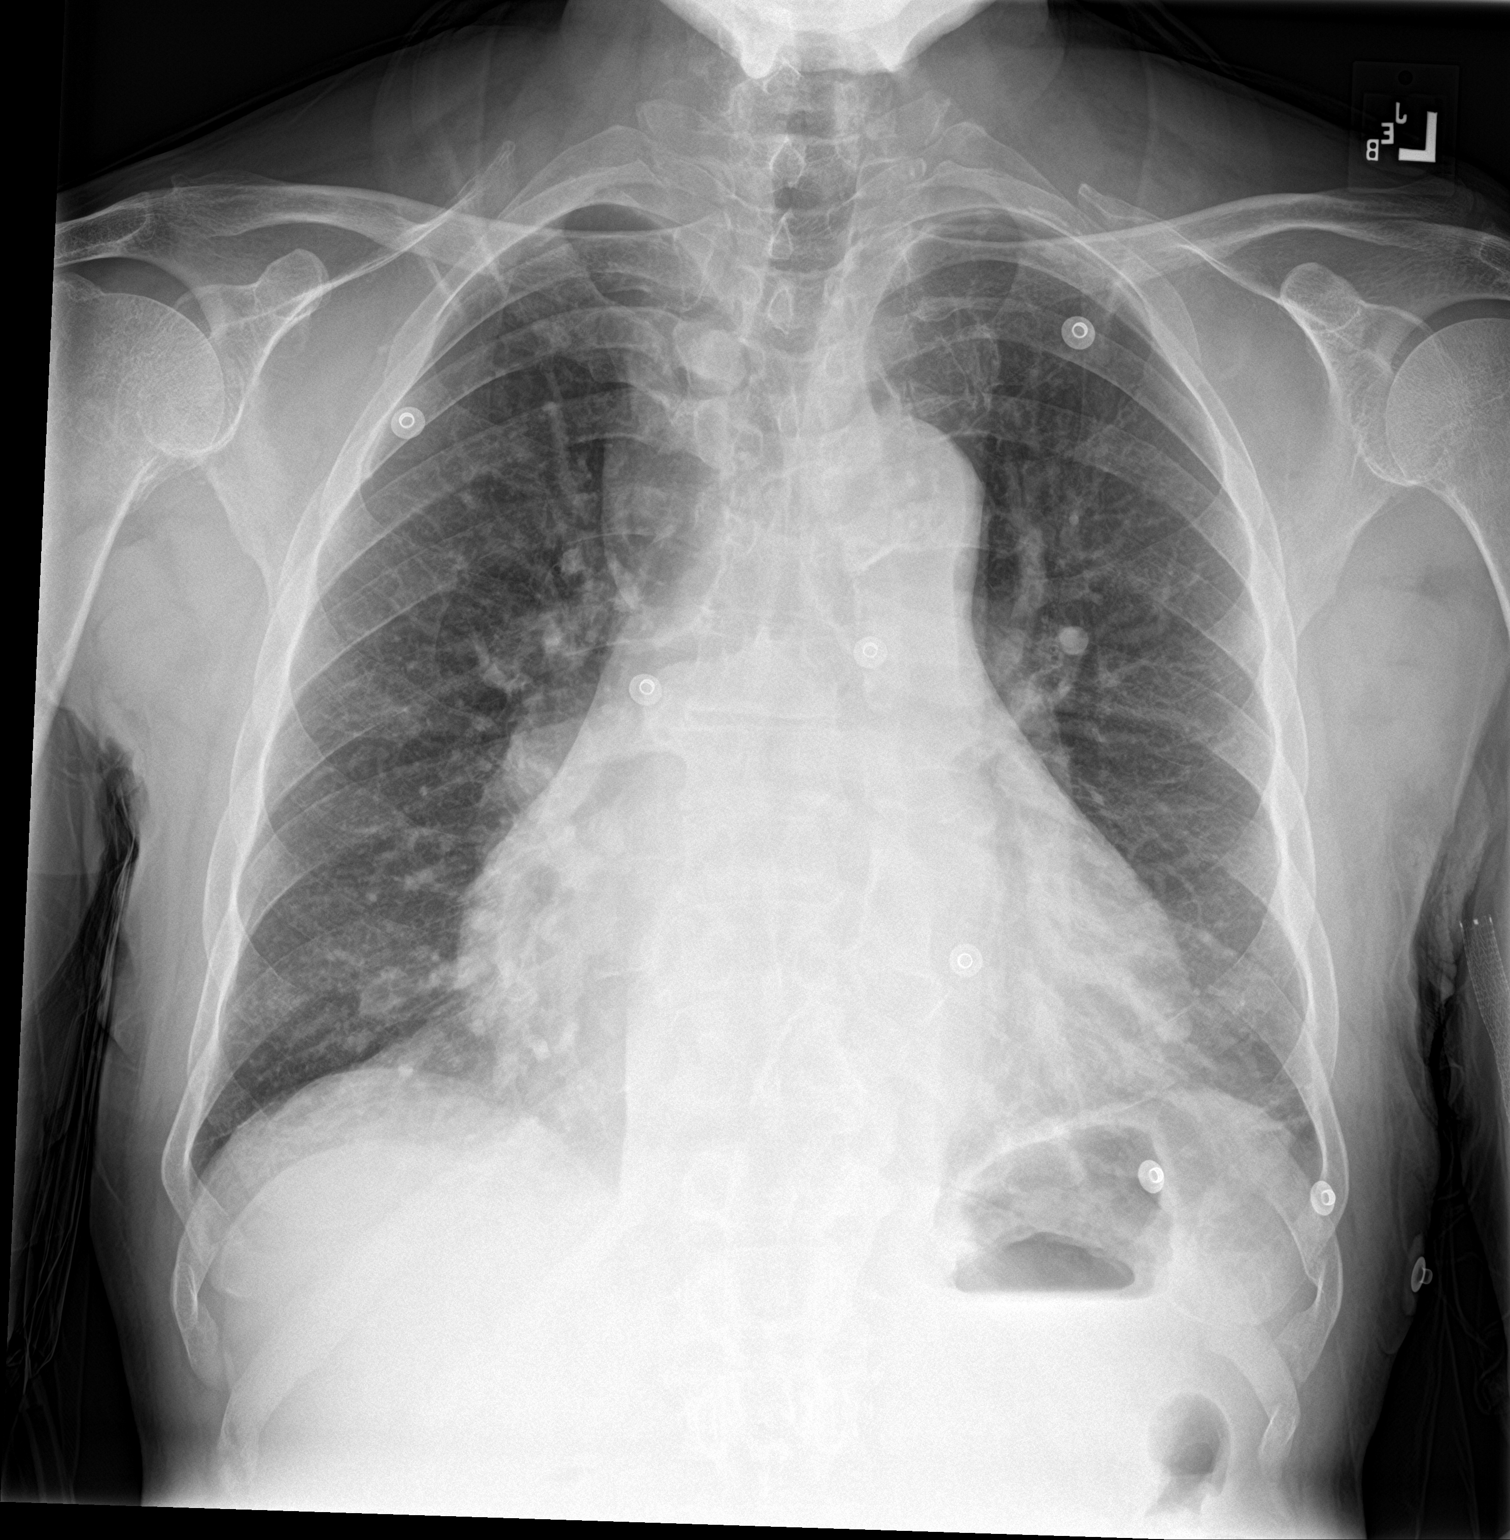

[chest lat]
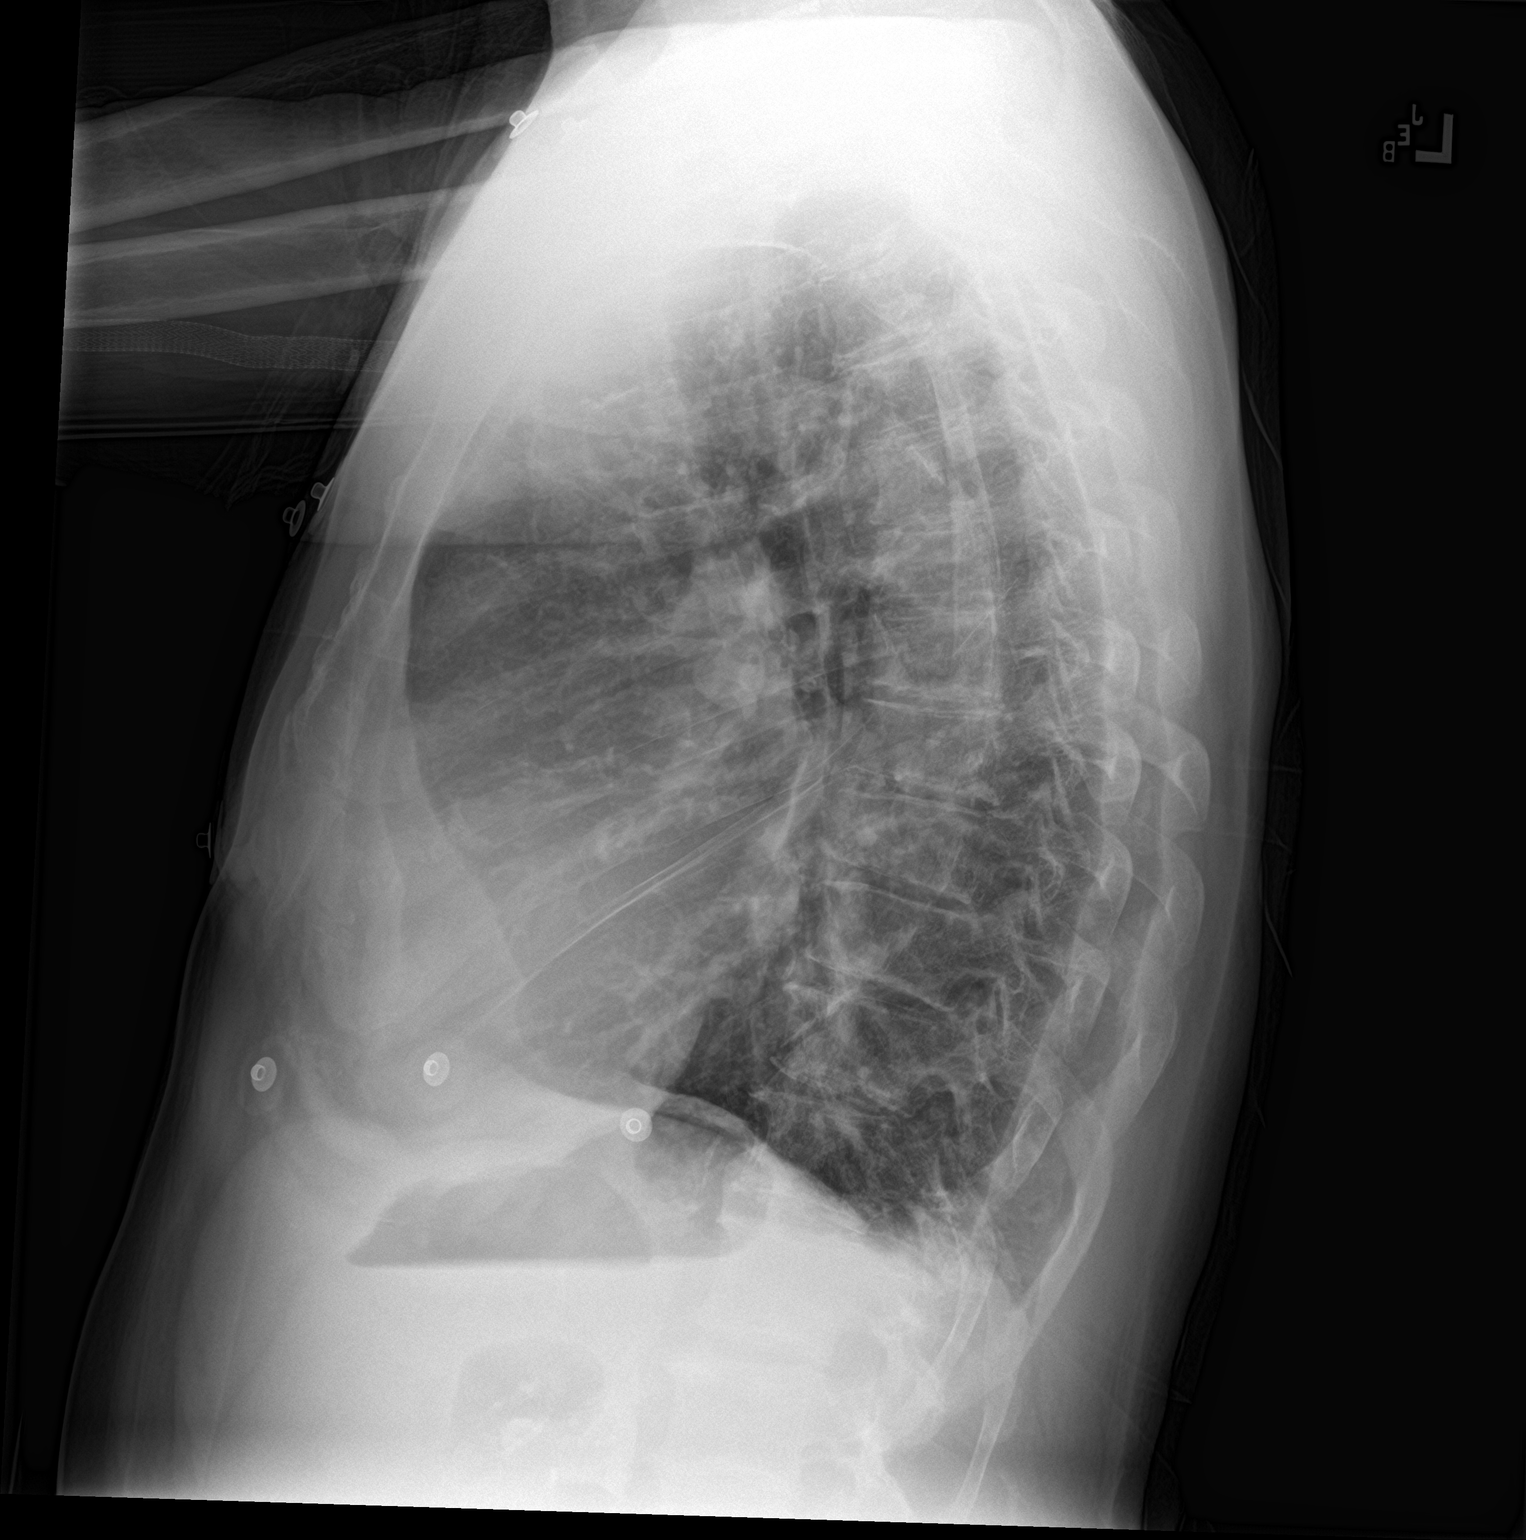

[2 of 2 positions shown; findings below may reference images not displayed]

FINDINGS: Unchanged enlarged cardiac silhouette. Shoulder pulmonary vascular
congestion. No large pleural effusion. No visible pneumothorax. No
acute osseous abnormality. Left upper extremity stent noted.
IMPRESSION: Cardiomegaly with central pulmonary vascular congestion.

## 2021-09-13 MED ORDER — DICYCLOMINE HCL 20 MG PO TABS
20.0000 mg | ORAL_TABLET | Freq: Four times a day (QID) | ORAL | Status: DC | PRN
  Filled 2021-09-13: qty 1

## 2021-09-13 MED ORDER — SORBITOL 70 % SOLN
30.0000 mL | Freq: Every day | Status: DC | PRN
  Filled 2021-09-13: qty 30

## 2021-09-13 MED ORDER — CINACALCET HCL 30 MG PO TABS
180.0000 mg | ORAL_TABLET | ORAL | Status: DC
  Administered 2021-09-14 – 2021-09-16 (×2): 180 mg via ORAL
  Filled 2021-09-13 (×2): qty 6

## 2021-09-13 MED ORDER — CALCITRIOL 0.25 MCG PO CAPS
2.0000 ug | ORAL_CAPSULE | ORAL | Status: DC
  Administered 2021-09-16: 2 ug via ORAL
  Filled 2021-09-13 (×2): qty 8

## 2021-09-13 MED ORDER — NICOTINE 14 MG/24HR TD PT24: 14.0000 mg | MEDICATED_PATCH | Freq: Every day | TRANSDERMAL | Status: DC

## 2021-09-13 MED ORDER — SODIUM CHLORIDE 0.9% FLUSH
3.0000 mL | Freq: Two times a day (BID) | INTRAVENOUS | Status: DC
  Administered 2021-09-14 – 2021-09-16 (×6): 3 mL via INTRAVENOUS

## 2021-09-13 MED ORDER — HYDRALAZINE HCL 20 MG/ML IJ SOLN
10.0000 mg | Freq: Four times a day (QID) | INTRAMUSCULAR | Status: DC | PRN
  Administered 2021-09-13: 10 mg via INTRAVENOUS
  Filled 2021-09-13: qty 1

## 2021-09-13 MED ORDER — CALCITRIOL 0.25 MCG PO CAPS
2.0000 ug | ORAL_CAPSULE | ORAL | Status: DC
  Administered 2021-09-14 (×2): 2 ug via ORAL
  Filled 2021-09-13: qty 8

## 2021-09-13 MED ORDER — SODIUM CHLORIDE 0.9% FLUSH
3.0000 mL | Freq: Two times a day (BID) | INTRAVENOUS | Status: DC
  Administered 2021-09-15 – 2021-09-17 (×3): 3 mL via INTRAVENOUS

## 2021-09-13 MED ORDER — AMIODARONE HCL 200 MG PO TABS
200.0000 mg | ORAL_TABLET | Freq: Every day | ORAL | Status: DC
  Administered 2021-09-13 – 2021-09-17 (×5): 200 mg via ORAL
  Filled 2021-09-13 (×5): qty 1

## 2021-09-13 MED ORDER — FOLIC ACID 1 MG PO TABS
1.0000 mg | ORAL_TABLET | Freq: Every day | ORAL | Status: DC
  Administered 2021-09-13 – 2021-09-17 (×5): 1 mg via ORAL
  Filled 2021-09-13 (×5): qty 1

## 2021-09-13 MED ORDER — AMLODIPINE BESYLATE 5 MG PO TABS: 5.0000 mg | ORAL_TABLET | Freq: Every day | ORAL | Status: DC

## 2021-09-13 MED ORDER — SODIUM CHLORIDE 0.9% FLUSH
3.0000 mL | INTRAVENOUS | Status: DC | PRN
  Administered 2021-09-14: 3 mL via INTRAVENOUS

## 2021-09-13 MED ORDER — IPRATROPIUM BROMIDE 0.02 % IN SOLN: 0.5000 mg | RESPIRATORY_TRACT | Status: DC | PRN

## 2021-09-13 MED ORDER — LOPERAMIDE HCL 2 MG PO CAPS: 2.0000 mg | ORAL_CAPSULE | ORAL | Status: DC | PRN

## 2021-09-13 MED ORDER — ONDANSETRON 4 MG PO TBDP: 4.0000 mg | ORAL_TABLET | Freq: Four times a day (QID) | ORAL | Status: DC | PRN

## 2021-09-13 MED ORDER — METHOCARBAMOL 500 MG PO TABS: 500.0000 mg | ORAL_TABLET | Freq: Three times a day (TID) | ORAL | Status: DC | PRN

## 2021-09-13 MED ORDER — SEVELAMER CARBONATE 800 MG PO TABS
4000.0000 mg | ORAL_TABLET | Freq: Three times a day (TID) | ORAL | Status: DC
  Administered 2021-09-13 – 2021-09-17 (×8): 4000 mg via ORAL
  Filled 2021-09-13 (×8): qty 5

## 2021-09-13 MED ORDER — PANTOPRAZOLE SODIUM 40 MG PO TBEC
40.0000 mg | DELAYED_RELEASE_TABLET | Freq: Two times a day (BID) | ORAL | Status: DC
Start: 2021-09-13 — End: 2021-09-17
  Administered 2021-09-14 – 2021-09-17 (×6): 40 mg via ORAL
  Filled 2021-09-13 (×6): qty 1

## 2021-09-13 MED ORDER — LABETALOL HCL 5 MG/ML IV SOLN
20.0000 mg | Freq: Once | INTRAVENOUS | Status: AC
  Administered 2021-09-13: 20 mg via INTRAVENOUS
  Filled 2021-09-13: qty 4

## 2021-09-13 MED ORDER — UMECLIDINIUM BROMIDE 62.5 MCG/ACT IN AEPB
1.0000 | INHALATION_SPRAY | Freq: Every day | RESPIRATORY_TRACT | Status: DC
  Administered 2021-09-14 – 2021-09-15 (×2): 1 via RESPIRATORY_TRACT
  Filled 2021-09-13: qty 7

## 2021-09-13 MED ORDER — NICOTINE 14 MG/24HR TD PT24
14.0000 mg | MEDICATED_PATCH | Freq: Every day | TRANSDERMAL | Status: DC
  Administered 2021-09-13 – 2021-09-17 (×5): 14 mg via TRANSDERMAL
  Filled 2021-09-13 (×5): qty 1

## 2021-09-13 MED ORDER — RENA-VITE PO TABS
1.0000 | ORAL_TABLET | Freq: Every day | ORAL | Status: DC
  Administered 2021-09-13 – 2021-09-16 (×4): 1 via ORAL
  Filled 2021-09-13 (×6): qty 1

## 2021-09-13 MED ORDER — ONDANSETRON HCL 4 MG PO TABS: 4.0000 mg | ORAL_TABLET | Freq: Four times a day (QID) | ORAL | Status: DC | PRN

## 2021-09-13 MED ORDER — AMLODIPINE BESYLATE 5 MG PO TABS
5.0000 mg | ORAL_TABLET | Freq: Every day | ORAL | Status: DC
  Administered 2021-09-14 – 2021-09-16 (×3): 5 mg via ORAL
  Filled 2021-09-13 (×4): qty 1

## 2021-09-13 MED ORDER — ACETAMINOPHEN 325 MG PO TABS: 650.0000 mg | ORAL_TABLET | Freq: Four times a day (QID) | ORAL | Status: DC | PRN

## 2021-09-13 MED ORDER — MOMETASONE FURO-FORMOTEROL FUM 200-5 MCG/ACT IN AERO
2.0000 | INHALATION_SPRAY | Freq: Two times a day (BID) | RESPIRATORY_TRACT | Status: DC
  Administered 2021-09-14 – 2021-09-16 (×5): 2 via RESPIRATORY_TRACT
  Filled 2021-09-13: qty 8.8

## 2021-09-13 MED ORDER — ROSUVASTATIN CALCIUM 5 MG PO TABS
10.0000 mg | ORAL_TABLET | Freq: Every day | ORAL | Status: DC
  Administered 2021-09-13 – 2021-09-17 (×5): 10 mg via ORAL
  Filled 2021-09-13 (×5): qty 2

## 2021-09-13 MED ORDER — CARVEDILOL 12.5 MG PO TABS
12.5000 mg | ORAL_TABLET | Freq: Two times a day (BID) | ORAL | Status: DC
  Administered 2021-09-14 – 2021-09-17 (×6): 12.5 mg via ORAL
  Filled 2021-09-13 (×6): qty 1

## 2021-09-13 MED ORDER — LEVALBUTEROL HCL 0.63 MG/3ML IN NEBU: 0.6300 mg | INHALATION_SOLUTION | RESPIRATORY_TRACT | Status: DC | PRN

## 2021-09-13 MED ORDER — HYDROXYZINE HCL 25 MG PO TABS
25.0000 mg | ORAL_TABLET | Freq: Four times a day (QID) | ORAL | Status: DC | PRN
  Administered 2021-09-13 – 2021-09-16 (×4): 25 mg via ORAL
  Filled 2021-09-13 (×5): qty 1

## 2021-09-13 MED ORDER — CHLORHEXIDINE GLUCONATE CLOTH 2 % EX PADS
6.0000 | MEDICATED_PAD | Freq: Every day | CUTANEOUS | Status: DC
  Administered 2021-09-14 – 2021-09-17 (×4): 6 via TOPICAL

## 2021-09-13 MED ORDER — NAPROXEN 250 MG PO TABS
500.0000 mg | ORAL_TABLET | Freq: Two times a day (BID) | ORAL | Status: DC | PRN
  Filled 2021-09-13: qty 2

## 2021-09-13 MED ORDER — OXYCODONE HCL 5 MG PO TABS
5.0000 mg | ORAL_TABLET | ORAL | Status: DC | PRN
  Administered 2021-09-14 – 2021-09-17 (×4): 5 mg via ORAL
  Filled 2021-09-13 (×5): qty 1

## 2021-09-13 MED ORDER — SODIUM CHLORIDE 0.9 % IV SOLN: 250.0000 mL | INTRAVENOUS | Status: DC | PRN

## 2021-09-13 MED ORDER — THIAMINE HCL 100 MG PO TABS
100.0000 mg | ORAL_TABLET | Freq: Every day | ORAL | Status: DC
  Administered 2021-09-13 – 2021-09-17 (×5): 100 mg via ORAL
  Filled 2021-09-13 (×5): qty 1

## 2021-09-13 MED ORDER — CARVEDILOL 12.5 MG PO TABS: 12.5000 mg | ORAL_TABLET | Freq: Two times a day (BID) | ORAL | Status: DC

## 2021-09-13 MED ORDER — ONDANSETRON HCL 4 MG/2ML IJ SOLN
4.0000 mg | Freq: Four times a day (QID) | INTRAMUSCULAR | Status: DC | PRN
Start: 2021-09-13 — End: 2021-09-17
  Administered 2021-09-14: 4 mg via INTRAVENOUS
  Filled 2021-09-13: qty 2

## 2021-09-13 MED ORDER — POLYETHYLENE GLYCOL 3350 17 G PO PACK
17.0000 g | PACK | Freq: Every day | ORAL | Status: DC | PRN
  Administered 2021-09-15: 17 g via ORAL
  Filled 2021-09-13: qty 1

## 2021-09-13 MED ORDER — ACETAMINOPHEN 650 MG RE SUPP: 650.0000 mg | Freq: Four times a day (QID) | RECTAL | Status: DC | PRN

## 2021-09-13 MED ORDER — DARBEPOETIN ALFA 200 MCG/0.4ML IJ SOSY
200.0000 ug | PREFILLED_SYRINGE | INTRAMUSCULAR | Status: DC
  Administered 2021-09-14 (×2): 200 ug via INTRAVENOUS
  Filled 2021-09-13 (×2): qty 0.4

## 2021-09-13 NOTE — ED Provider Notes (Signed)
[name] EM[facility]GENCY [facility]MENT Provider Note   CSN: [id number] Arrival date & time: 09/13/21  1018     History  Chief Complaint  Patient presents with   Chest Pain    Raymond [name] is a 66 y.o. male.   Chest Pain  Patient presents to the ED for evaluation of chest pain cocaine abuse.  Patient states he has a history of cocaine addiction.  He has been using regularly and has skipped his last 3 dialysis appointments because of his persistent drug use.  Patient states he has had some episodes of shortness of breath as well as chest pain recently although not having any chest pain right now.  Patient states he needs some help with his cocaine addiction.  He denies any fevers or chills.  No vomiting or diarrhea today although he did vomit some yesterday.  Home Medications Prior to Admission medications   Medication Sig Start Date End Date Taking? Authorizing Provider  amiodarone (PACERONE) 200 MG tablet Take 200 mg by mouth daily. 06/17/21  Yes [provider]  amLODipine (NORVASC) 5 MG tablet Take 5 mg by mouth at bedtime. 07/22/21  Yes [provider]  calcitRIOL (ROCALTROL) 0.5 MCG capsule Take 4 capsules (2 mcg total) by mouth Every Tuesday,Thursday,and Saturday with dialysis. 05/27/21  Yes France Ravens, MD  carvedilol (COREG) 12.5 MG tablet Take 12.5 mg by mouth 2 (two) times daily. 07/06/21  Yes [provider]  rosuvastatin (CRESTOR) 10 MG tablet Take 1 tablet (10 mg total) by mouth daily. 12/11/20 09/13/21 Yes Mercy Riding, MD  sevelamer carbonate (RENVELA) 800 MG tablet Take 4 tablets (3,200 mg total) by mouth 3 (three) times daily with meals. Patient taking differently: Take 4,000 mg by mouth 3 (three) times daily with meals. 09/14/18  Yes Vasireddy, Grier Mitts, MD  albuterol (VENTOLIN HFA) 108 (90 Base) MCG/ACT inhaler Inhale 2 puffs into the lungs every 4 (four) hours as needed for shortness of breath or wheezing. Patient not taking: Reported  on 09/13/2021 11/06/19   [provider]  cinacalcet (SENSIPAR) 30 MG tablet Take 6 tablets (180 mg total) by mouth Every Tuesday,Thursday,and Saturday with dialysis. 12/12/20   Mercy Riding, MD  Darbepoetin Alfa (ARANESP) 200 MCG/0.4ML SOSY injection Inject 0.4 mLs (200 mcg total) into the vein every Tuesday with hemodialysis. 05/25/21   France Ravens, MD  folic acid (FOLVITE) 1 MG tablet Take 1 tablet (1 mg total) by mouth daily. Patient not taking: Reported on 09/13/2021 12/12/20   Mercy Riding, MD  multivitamin (RENA-VIT) TABS tablet Take 1 tablet by mouth at bedtime. Patient not taking: Reported on 09/13/2021 05/25/21   France Ravens, MD  nicotine (NICODERM CQ - DOSED IN MG/24 HOURS) 14 mg/24hr patch Place 1 patch (14 mg total) onto the skin daily. Patient not taking: Reported on 09/13/2021 12/12/20   Mercy Riding, MD  thiamine 100 MG tablet Take 1 tablet (100 mg total) by mouth daily. Patient not taking: Reported on 05/21/2021 12/12/20   Mercy Riding, MD  apixaban (ELIQUIS) 5 MG TABS tablet Take 1 tablet (5 mg total) by mouth 2 (two) times daily. 06/22/20 08/03/20  Karoline Caldwell, PA-C      Allergies    Patient has no known allergies.    Review of Systems   Review of Systems  Cardiovascular:  Positive for chest pain.   Physical Exam Updated Vital Signs BP (!) 194/104    Pulse 78    Temp 98.4 F (36.9  C) (Oral)    Resp 16    SpO2 98%  Physical Exam Vitals and nursing note reviewed.  Constitutional:      Appearance: He is well-developed. He is not diaphoretic.  HENT:     Head: Normocephalic and atraumatic.     Right Ear: External ear normal.     Left Ear: External ear normal.  Eyes:     General: No scleral icterus.       Right eye: No discharge.        Left eye: No discharge.     Conjunctiva/sclera: Conjunctivae normal.  Neck:     Trachea: No tracheal deviation.  Cardiovascular:     Rate and Rhythm: Normal rate and regular rhythm.  Pulmonary:     Effort: Pulmonary effort is  normal. No respiratory distress.     Breath sounds: Normal breath sounds. No stridor. No wheezing or rales.  Abdominal:     General: Bowel sounds are normal. There is no distension.     Palpations: Abdomen is soft.     Tenderness: There is no abdominal tenderness. There is no guarding or rebound.  Musculoskeletal:        General: No tenderness or deformity.     Cervical back: Neck supple.  Skin:    General: Skin is warm and dry.     Findings: No rash.  Neurological:     General: No focal deficit present.     Mental Status: He is alert.     Cranial Nerves: No cranial nerve deficit (no facial droop, extraocular movements intact, no slurred speech).     Sensory: No sensory deficit.     Motor: No abnormal muscle tone or seizure activity.     Coordination: Coordination normal.  Psychiatric:     Comments: Depressed mood    ED Results / Procedures / Treatments   Labs (all labs ordered are listed, but only abnormal results are displayed) Labs Reviewed  BASIC METABOLIC PANEL - Abnormal; Notable for the following components:      Result Value   Chloride 92 (*)    Glucose, Bld 111 (*)    BUN 102 (*)    Creatinine, Ser 19.45 (*)    GFR, Estimated 2 (*)    Anion gap 21 (*)    All other components within normal limits  CBC - Abnormal; Notable for the following components:   WBC 3.1 (*)    RBC 2.35 (*)    Hemoglobin 7.6 (*)    HCT 23.8 (*)    MCV 101.3 (*)    RDW 19.3 (*)    All other components within normal limits  TROPONIN I (HIGH SENSITIVITY) - Abnormal; Notable for the following components:   Troponin I (High Sensitivity) 42 (*)    All other components within normal limits  TROPONIN I (HIGH SENSITIVITY) - Abnormal; Notable for the following components:   Troponin I (High Sensitivity) 41 (*)    All other components within normal limits  ETHANOL  POC OCCULT BLOOD, ED    EKG EKG Interpretation  Date/Time:  Monday September 13 2021 10:16:02 EST Ventricular Rate:  84 PR  Interval:  220 QRS Duration: 90 QT Interval:  406 QTC Calculation: 479 R Axis:   54 Text Interpretation: Sinus rhythm with 1st degree A-V block Otherwise normal ECG When compared with ECG of 28-May-2021 11:39, No significant change since last tracing Confirmed by Dorie Rank [phone]) on 09/13/2021 1:45:30 PM  Radiology No results found.  Procedures Procedures  Medications Ordered in ED Medications  labetalol (NORMODYNE) injection 20 mg (has no administration in time range)    ED Course/ Medical Decision Making/ A&P Clinical Course as of 09/14/21 1556  Mon Sep 13, 2021  1150 CBC(!) Hemoglobin decreased compared to previous [JK]  1425 Troponin I (High Sensitivity)(!) Troponin elevated but decreased compared to previous values [JK]  1428 Discussed with Dr Jonnie Finner.  Will arrange for dialysis during admission [JK]  1604 D/w Dr Grandville Silos regarding admission [JK]    Clinical Course User Index [JK] Dorie Rank, MD                           Medical Decision Making Amount and/or Complexity of Data Reviewed Labs: ordered. Decision-making details documented in ED Course. Radiology: ordered.  Risk Prescription drug management. Decision regarding hospitalization.  Chronic renal failure. Patient presents with chest pain and noncompliance with dialysis.  Patient's BUN and creatinine are significantly elevated.  Patient has not gone to dialysis in several sessions now.  He is not having any acute shortness of breath and no acute hyperkalemia but he will require dialysis.  I will consult with nephrology service.  Chest pain. Troponins are elevated but they are decreased compared to previous.  Doubt ACS at this time.  Suspect the chest pain related to his cocaine use.  Cocaine abuse Patient is interested in sobriety.  Will ultimately need to provide outpatient review resources to help him with his cocaine addiction  Hypertension Likely related to his chronic kidney disease as well as a  cocaine use.  Will order a dose of blood pressure medications.  Continue to monitor closely        Final Clinical Impression(s) / ED Diagnoses Final diagnoses:  Chronic kidney disease, unspecified CKD stage  Uncontrolled hypertension  Cocaine abuse (HCC)     Dorie Rank, MD 09/14/21 1557

## 2021-09-13 NOTE — ED Notes (Signed)
Dialysis consent signed and at the bedside  ? ?

## 2021-09-13 NOTE — Assessment & Plan Note (Addendum)
-   Secondary to medication noncompliance. ?-Per ED physician patient received a dose of labetalol in the ED. ?-Blood pressure better controlled with resumption of home regimen of Norvasc, Coreg and hemodialysis.   ?-Continue to monitor blood pressures per protocol; last blood pressure reading was 162/94 ?-IV hydralazine as needed.   ?

## 2021-09-13 NOTE — Procedures (Signed)
? ?  I was present at this dialysis session, have reviewed the session itself and made  appropriate changes ?Kelly Splinter MD ?[employer] ?pager [phone]   ?09/13/2021, 6:02 PM ? ? ?

## 2021-09-13 NOTE — Assessment & Plan Note (Addendum)
-  Secondary to hemodialysis noncompliance. ?-Patient presented to the ED with shortness of breath, stating he feels like he needs urgent hemodialysis as he is missed about 2-3 hemodialysis sessions.  Patient also with some complaints of intermittent chest pain. ?-Patient noted to have some asterixis on examination as well as some mild tremors on presentation which have improved  ?-Patient had some nausea and emesis 1 day prior to admission. ?-Patient seen in consultation by nephrology and patient underwent hemodialysis last night as well as this morning.   ?-Patient with some complaints of extremity cramping.   ?-Improving clinically overall and appears back to his baseline.   ?-Per nephrology he will be dialyzed today to get back on his normal schedule ?-Patient's BUNs/creatinine went from 102/19.45 ->18/7.61 -> 28/10.13 -> 14/6.96 and is uremic symptoms are improved ?-Further care per nephrology and is getting Dialysis tomorrow ?

## 2021-09-13 NOTE — ED Notes (Signed)
Administering BP meds per provider Grandville Silos, MD.  ?

## 2021-09-13 NOTE — Assessment & Plan Note (Addendum)
-  Currently stable. ?-Continue  Incruse, Dulera, Xopenex and Atrovent nebs as needed. ?-Continue monitor respiratory status carefully and per protocol ?-SpO2: 95 % ?O2 Flow Rate (L/min): 2 L/min ?-He is stable from a Respiratory Standpoint ?

## 2021-09-13 NOTE — Assessment & Plan Note (Addendum)
-   Patient stated had some melanotic stools this weekend. ?-Hemoglobin relatively stable and now 7.9/25.5 ?-Anemia panel consistent with anemia of chronic disease as it showed an iron level of 60, U IBC 224, TIBC 284, saturation ratios of 21%, ferritin level 148, folate level 15.4, vitamin B12 level 633 ?-Continue PPI twice daily.   ?-Transfusion threshold hemoglobin < 7. ?-May discuss with Gastroenterology for further evaluation recommendations and he underwent an enteroscopy back in July of last year [facility]; FOBT is negative  ?-Follow up with GI in the outpatient setting  ? ?

## 2021-09-13 NOTE — Assessment & Plan Note (Addendum)
Patient for HD x3 since admission.. ?-Importance of compliance with hemodialysis stressed to patient. ?-Per nephrology. ?-His BUNs/creatinine has drastically improved and went from 102/19.45 -> 18/7.61 -> 28/10.13 -> 14/6.96 ?-Hold further nephrotoxic medications, contrast dyes, hypotension and renally adjust medications ?-Repeat CMP within 1 week  ?

## 2021-09-13 NOTE — Assessment & Plan Note (Addendum)
-  Patient with crack cocaine dependence. ?-Patient motivated for cessation and asking for help for his crack cocaine dependence. ?-Initiated clonidine detox protocol but has now completed  ?-TOC consulted for resources   ?-He is medically stable can be discharged directly to rehab facility.  ?

## 2021-09-13 NOTE — Consult Note (Signed)
Renal Service Consult Note [facility]  Raymond [name] 09/13/2021 [name], MD Requesting Physician: Dr. Tomi Bamberger  Reason for Consult: ESRD SOB and CP, cocaine abuse HPI: The patient is a 66 y.o. year-old w/ hx of ESRD on HD, COPD, CAD, hep C, HTN, PAD, DM2 and drug abuse (cocaine) presented to ED today w/ c/o chest pain and hx of cocaine abuse. Pt missed 3 HD appts due to cocaine addiction. Also is SOB. Asked to see for ESRD.    Pt seen in ED. Main c/o is his addiction, mostly crack cocaine, asking for help.  Mild SOB, no orthopnea.  No fevers , chills or CP. Missed about 3 HD sessions.    ROS - denies CP, no joint pain, no HA, no blurry vision, no rash, no diarrhea, no nausea/ vomiting, no dysuria, no difficulty voiding   Past Medical History  Past Medical History:  Diagnosis Date   Anemia    CAD (coronary artery disease)    a. 03/2018: cath showing 20 to 30% stenosis along the LAD, luminal irregularities along the RCA, and angiographically normal LCx   COPD (chronic obstructive pulmonary disease) (HCC)    ESRD (end stage renal disease) on dialysis ([place])    "MWF; [facility]" (07/22/17)   Hepatitis C    Still positive s/p liver biopsy at [facility]  and interferon therapy for 6 months. Most recent lab work was on 10/24/12   Hepatitis C    "took the tx; gone now" (12/05/2016)  Was treated   History of blood transfusion ~ 2012/2013   "related to my kidneys; blood was low"   Hypertension    Peripheral arterial disease ([place])    Substance abuse ([place])    Thyroid disease    Type 2 diabetes mellitus with other diabetic kidney complication ([place]) 5/46/5035   Past Surgical History  Past Surgical History:  Procedure Laterality Date   ABDOMINAL AORTOGRAM W/LOWER EXTREMITY N/A 02/08/2018   Procedure: ABDOMINAL AORTOGRAM W/LOWER EXTREMITY;  Surgeon: Conrad Danvers, MD;  Location: [place] [facility];  Service: Cardiovascular;  Laterality: N/A;   ABDOMINAL AORTOGRAM W/LOWER  EXTREMITY Bilateral 06/15/2020   Procedure: ABDOMINAL AORTOGRAM W/LOWER EXTREMITY;  Surgeon: Waynetta Sandy, MD;  Location: [place] [facility];  Service: Cardiovascular;  Laterality: Bilateral;   AV FISTULA PLACEMENT Left Aug. 2013 ?   BIOPSY  01/17/2020   Procedure: BIOPSY;  Surgeon: Jackquline Denmark, MD;  Location: [place];  Service: Endoscopy;;   COLONOSCOPY WITH PROPOFOL N/A 08/21/2018   Procedure: COLONOSCOPY WITH PROPOFOL;  Surgeon: Yetta Flock, MD;  Location: [place];  Service: Gastroenterology;  Laterality: N/A;   ENTEROSCOPY N/A 01/17/2020   Procedure: ENTEROSCOPY;  Surgeon: Jackquline Denmark, MD;  Location: [facility] ENDOSCOPY;  Service: Endoscopy;  Laterality: N/A;   ENTEROSCOPY N/A 07/31/2020   Procedure: ENTEROSCOPY;  Surgeon: Doran Stabler, MD;  Location: [place];  Service: Gastroenterology;  Laterality: N/A;   ENTEROSCOPY N/A 01/27/2021   Procedure: ENTEROSCOPY;  Surgeon: Yetta Flock, MD;  Location: [facility] ENDOSCOPY;  Service: Gastroenterology;  Laterality: N/A;   ESOPHAGOGASTRODUODENOSCOPY (EGD) WITH PROPOFOL N/A 08/20/2018   Procedure: ESOPHAGOGASTRODUODENOSCOPY (EGD) WITH PROPOFOL;  Surgeon: Gatha Mayer, MD;  Location: [place];  Service: Endoscopy;  Laterality: N/A;   FEMORAL-POPLITEAL BYPASS GRAFT Left 04/11/2018   Procedure: BYPASS GRAFT FEMORAL-POPLITEAL ARTERY LEFT LEG;  Surgeon: Rosetta Posner, MD;  Location: [place];  Service: Vascular;  Laterality: Left;   GIVENS CAPSULE STUDY N/A 07/31/2020  Procedure: GIVENS CAPSULE STUDY;  Surgeon: Doran Stabler, MD;  Location: [place];  Service: Gastroenterology;  Laterality: N/A;   HOT HEMOSTASIS N/A 08/20/2018   Procedure: HOT HEMOSTASIS (ARGON PLASMA COAGULATION/BICAP);  Surgeon: Gatha Mayer, MD;  Location: [facility] ENDOSCOPY;  Service: Endoscopy;  Laterality: N/A;   HOT HEMOSTASIS N/A 01/17/2020   Procedure: HOT HEMOSTASIS (ARGON PLASMA COAGULATION/BICAP);  Surgeon: Jackquline Denmark, MD;  Location:  [facility] ENDOSCOPY;  Service: Endoscopy;  Laterality: N/A;   HOT HEMOSTASIS N/A 01/27/2021   Procedure: HOT HEMOSTASIS (ARGON PLASMA COAGULATION/BICAP);  Surgeon: Yetta Flock, MD;  Location: [facility] ENDOSCOPY;  Service: Gastroenterology;  Laterality: N/A;   LEFT HEART CATH AND CORONARY ANGIOGRAPHY N/A 03/29/2018   Procedure: LEFT HEART CATH AND CORONARY ANGIOGRAPHY;  Surgeon: Nelva Bush, MD;  Location: [place] [facility];  Service: Cardiovascular;  Laterality: N/A;   LIVER BIOPSY     ORIF ULNAR FRACTURE Left 05/18/2017   Procedure: OPEN REDUCTION INTERNAL FIXATION (ORIF) ULNAR FRACTURE;  Surgeon: Iran Planas, MD;  Location: [place];  Service: Orthopedics;  Laterality: Left;   PERIPHERAL VASCULAR INTERVENTION Right 06/15/2020   Procedure: PERIPHERAL VASCULAR INTERVENTION;  Surgeon: Waynetta Sandy, MD;  Location: [place] [facility];  Service: Cardiovascular;  Laterality: Right;   POLYPECTOMY  08/21/2018   Procedure: POLYPECTOMY;  Surgeon: Yetta Flock, MD;  Location: [place];  Service: Gastroenterology;;   Earney Mallet N/A 12/11/2012   Procedure: Earney Mallet;  Surgeon: Serafina Mitchell, MD;  Location: [facility] CATH LAB;  Service: Cardiovascular;  Laterality: N/A;   WOUND EXPLORATION Left 06/15/2020   Procedure: GROIN  EXPLORATION;  Surgeon: Waynetta Sandy, MD;  Location: [facility] OR;  Service: Vascular;  Laterality: Left;   Family History  Family History  Problem Relation Age of Onset   Diabetes Father    Hypertension Father    Heart disease Father    Social History  reports that he has been smoking cigarettes. He has a 5.00 pack-year smoking history. He has never used smokeless tobacco. He reports current alcohol use of about 10.0 standard drinks per week. He reports that he does not currently use drugs after having used the following drugs: Cocaine. Allergies No Known Allergies Home medications Prior to Admission medications   Medication Sig Start Date End Date Taking?  Authorizing Provider  amiodarone (PACERONE) 200 MG tablet Take 200 mg by mouth daily. 06/17/21  Yes [provider]  amLODipine (NORVASC) 5 MG tablet Take 5 mg by mouth at bedtime. 07/22/21  Yes [provider]  calcitRIOL (ROCALTROL) 0.5 MCG capsule Take 4 capsules (2 mcg total) by mouth Every Tuesday,Thursday,and Saturday with dialysis. 05/27/21  Yes France Ravens, MD  carvedilol (COREG) 12.5 MG tablet Take 12.5 mg by mouth 2 (two) times daily. 07/06/21  Yes [provider]  rosuvastatin (CRESTOR) 10 MG tablet Take 1 tablet (10 mg total) by mouth daily. 12/11/20 09/13/21 Yes Mercy Riding, MD  sevelamer carbonate (RENVELA) 800 MG tablet Take 4 tablets (3,200 mg total) by mouth 3 (three) times daily with meals. Patient taking differently: Take 4,000 mg by mouth 3 (three) times daily with meals. 09/14/18  Yes Vasireddy, Grier Mitts, MD  albuterol (VENTOLIN HFA) 108 (90 Base) MCG/ACT inhaler Inhale 2 puffs into the lungs every 4 (four) hours as needed for shortness of breath or wheezing. Patient not taking: Reported on 09/13/2021 11/06/19   [provider]  cinacalcet (SENSIPAR) 30 MG tablet Take 6 tablets (180 mg total) by mouth Every Tuesday,Thursday,and Saturday with dialysis.  12/12/20   Mercy Riding, MD  Darbepoetin Alfa (ARANESP) 200 MCG/0.4ML SOSY injection Inject 0.4 mLs (200 mcg total) into the vein every Tuesday with hemodialysis. 05/25/21   France Ravens, MD  folic acid (FOLVITE) 1 MG tablet Take 1 tablet (1 mg total) by mouth daily. Patient not taking: Reported on 09/13/2021 12/12/20   Mercy Riding, MD  multivitamin (RENA-VIT) TABS tablet Take 1 tablet by mouth at bedtime. Patient not taking: Reported on 09/13/2021 05/25/21   France Ravens, MD  nicotine (NICODERM CQ - DOSED IN MG/24 HOURS) 14 mg/24hr patch Place 1 patch (14 mg total) onto the skin daily. Patient not taking: Reported on 09/13/2021 12/12/20   Mercy Riding, MD  thiamine 100 MG tablet Take 1 tablet (100 mg total) by mouth  daily. Patient not taking: Reported on 05/21/2021 12/12/20   Mercy Riding, MD  apixaban (ELIQUIS) 5 MG TABS tablet Take 1 tablet (5 mg total) by mouth 2 (two) times daily. 06/22/20 08/03/20  Karoline Caldwell, PA-C     Vitals:   09/13/21 1300 09/13/21 1330 09/13/21 1400 09/13/21 1430  BP: (!) 207/102 (!) 171/94 (!) 194/104 (!) 195/101  Pulse: 80 75 78 78  Resp: '19 17 16 15  '$ Temp:      TempSrc:      SpO2: 99% 99% 98% 98%   Exam Gen alert, no distress No rash, cyanosis or gangrene Sclera anicteric, throat clear  No jvd or bruits Chest clear bilat to bases, no rales/ wheezing RRR no RG Abd soft ntnd no mass or ascites +bs GU normal male MS no joint effusions or deformity Ext trace-1+ bilat LE edema, no wounds or ulcers Neuro is alert, Ox 3 , nf, some +jerking but neg for asterixis  LUA AVF +bruit     Home meds include - amiodarone, norvasc, coreg 12.5 bid, crestor, renvela 4 ac tid, rena-vit, nicotine patch, prns/ vits/ supps     OP HD: [facility] TTS   4h  400/1.5  67kg  2/2 bath  L AVF  Hep none  - rocaltrol 2 ug tiw po  - mircera 200 mcq g q2 wks  - home; renvela 4 ac tid    Na 140  K 4.9  CO2 27  BUN 102  Cr 19  Hb 7.6  WBC 3K  plt wnl    CXR - IMPRESSION: Cardiomegaly with central pulmonary vascular congestion.    BP 190/ 101, HR 78  RR 15- 21  Assessment/ Plan: Chest pain  - prob secondary to missed HD, vol ^^ Cocaine - hx of abuse  ESRD - on HD TTS.  Has missed HD x 3 due to drug abuse, quite azotemic.  Plan HD either tonight or tomorrow am. May need serial HD.  HTN - cont home meds  Atrial Fib - per pmd MBD ckd - cont vdra w/ hd Anemia ckd - Hb here is low at 7.6, get OP records      Kelly Splinter  MD 09/13/2021, 3:01 PM Recent Labs  Lab 09/13/21 1030  HGB 7.6*  CALCIUM 10.1  CREATININE 19.45*  K 4.9

## 2021-09-13 NOTE — H&P (Signed)
History and Physical    Raymond [name] EXH:[id number] DOB: [birth year] DOA: 09/13/2021  PCP: [name], FNP Patient coming from: Home  I have personally briefly reviewed patient's old medical records in [place]  Chief Complaint: Shortness of breath/I feel I need urgent hemodialysis  HPI: Raymond [name] is a 66 y.o. male with medical history significant of end-stage renal disease on hemodialysis TTS and respiratory with noncompliance to hemodialysis, crack cocaine dependence, atrial fibrillation, medication noncompliance, COPD, anemia of chronic kidney disease, hypertension presented to the ED with complaints of worsening shortness of breath, intermittent chest pain, burping, bout of nausea and emesis the day prior to admission, some intermittent chest pain currently chest pain-free, some lower extremity edema.  Patient states due to his crack cocaine dependence missed 3 sessions of his hemodialysis and feels he needs urgent hemodialysis.  Patient does endorse some melanotic stools over the weekend.  Denies any iron use.  No fevers, no chills, no abdominal pain, no diarrhea, no constipation, no hematochezia, no hematemesis.  Patient does endorse some feelings of feeling hot and sweating. ED Course: Patient seen in the ED noted initially to have a blood pressure of 207/102, respiratory rate of 23, pulse of 86, temperature of 98.4 with sats of 98% on room air.  COVID-19 PCR pending.  CBC obtained with a white count of 3.1, hemoglobin of 7.6, platelet count of 173.  Basic metabolic profile obtained with a chloride of 92, glucose of 111, BUN of 102, creatinine of 19.45 otherwise was within normal limits.  High-sensitivity troponin at 41, 42.  Chest x-ray obtained with cardiomegaly with central pulmonary vascular congestion.  EKG with normal sinus rhythm, first-degree AV block, LVH.  ED physician stated give patient a dose of IV labetalol for hypertensive urgency.  Nephrology consulted.  Review  of Systems: As per HPI otherwise all other systems reviewed and are negative.  Past Medical History:  Diagnosis Date   Anemia    CAD (coronary artery disease)    a. 03/2018: cath showing 33 to 30% stenosis along the LAD, luminal irregularities along the RCA, and angiographically normal LCx   COPD (chronic obstructive pulmonary disease) (HCC)    ESRD (end stage renal disease) on dialysis [facility])    "MWF; [facility] Rinks" (07/22/17)   Hepatitis C    Still positive s/p liver biopsy at [facility]  and interferon therapy for 6 months. Most recent lab work was on 10/24/12   Hepatitis C    "took the tx; gone now" (12/05/2016)  Was treated   History of blood transfusion ~ 2012/2013   "related to my kidneys; blood was low"   Hypertension    Peripheral arterial disease ([place])    Substance abuse ([place])    Thyroid disease    Type 2 diabetes mellitus with other diabetic kidney complication ([place]) 3/81/0175    Past Surgical History:  Procedure Laterality Date   ABDOMINAL AORTOGRAM W/LOWER EXTREMITY N/A 02/08/2018   Procedure: ABDOMINAL AORTOGRAM W/LOWER EXTREMITY;  Surgeon: Conrad Ailey, MD;  Location: [place] CV LAB;  Service: Cardiovascular;  Laterality: N/A;   ABDOMINAL AORTOGRAM W/LOWER EXTREMITY Bilateral 06/15/2020   Procedure: ABDOMINAL AORTOGRAM W/LOWER EXTREMITY;  Surgeon: Waynetta Sandy, MD;  Location: [place] [facility] LAB;  Service: Cardiovascular;  Laterality: Bilateral;   AV FISTULA PLACEMENT Left Aug. 2013 ?   BIOPSY  01/17/2020   Procedure: BIOPSY;  Surgeon: Jackquline Denmark, MD;  Location: [place];  Service: Endoscopy;;   COLONOSCOPY WITH PROPOFOL  N/A 08/21/2018   Procedure: COLONOSCOPY WITH PROPOFOL;  Surgeon: Yetta Flock, MD;  Location: [place];  Service: Gastroenterology;  Laterality: N/A;   ENTEROSCOPY N/A 01/17/2020   Procedure: ENTEROSCOPY;  Surgeon: Jackquline Denmark, MD;  Location: [facility] ENDOSCOPY;  Service: Endoscopy;  Laterality: N/A;   ENTEROSCOPY N/A 07/31/2020    Procedure: ENTEROSCOPY;  Surgeon: Doran Stabler, MD;  Location: [place];  Service: Gastroenterology;  Laterality: N/A;   ENTEROSCOPY N/A 01/27/2021   Procedure: ENTEROSCOPY;  Surgeon: Yetta Flock, MD;  Location: [facility] ENDOSCOPY;  Service: Gastroenterology;  Laterality: N/A;   ESOPHAGOGASTRODUODENOSCOPY (EGD) WITH PROPOFOL N/A 08/20/2018   Procedure: ESOPHAGOGASTRODUODENOSCOPY (EGD) WITH PROPOFOL;  Surgeon: Gatha Mayer, MD;  Location: [place];  Service: Endoscopy;  Laterality: N/A;   FEMORAL-POPLITEAL BYPASS GRAFT Left 04/11/2018   Procedure: BYPASS GRAFT FEMORAL-POPLITEAL ARTERY LEFT LEG;  Surgeon: Rosetta Posner, MD;  Location: [facility] OR;  Service: Vascular;  Laterality: Left;   GIVENS CAPSULE STUDY N/A 07/31/2020   Procedure: GIVENS CAPSULE STUDY;  Surgeon: Doran Stabler, MD;  Location: [place];  Service: Gastroenterology;  Laterality: N/A;   HOT HEMOSTASIS N/A 08/20/2018   Procedure: HOT HEMOSTASIS (ARGON PLASMA COAGULATION/BICAP);  Surgeon: Gatha Mayer, MD;  Location: [facility] ENDOSCOPY;  Service: Endoscopy;  Laterality: N/A;   HOT HEMOSTASIS N/A 01/17/2020   Procedure: HOT HEMOSTASIS (ARGON PLASMA COAGULATION/BICAP);  Surgeon: Jackquline Denmark, MD;  Location: [facility] ENDOSCOPY;  Service: Endoscopy;  Laterality: N/A;   HOT HEMOSTASIS N/A 01/27/2021   Procedure: HOT HEMOSTASIS (ARGON PLASMA COAGULATION/BICAP);  Surgeon: Yetta Flock, MD;  Location: [facility] ENDOSCOPY;  Service: Gastroenterology;  Laterality: N/A;   LEFT HEART CATH AND CORONARY ANGIOGRAPHY N/A 03/29/2018   Procedure: LEFT HEART CATH AND CORONARY ANGIOGRAPHY;  Surgeon: Nelva Bush, MD;  Location: [place] CV LAB;  Service: Cardiovascular;  Laterality: N/A;   LIVER BIOPSY     ORIF ULNAR FRACTURE Left 05/18/2017   Procedure: OPEN REDUCTION INTERNAL FIXATION (ORIF) ULNAR FRACTURE;  Surgeon: Iran Planas, MD;  Location: [place];  Service: Orthopedics;  Laterality: Left;   PERIPHERAL VASCULAR INTERVENTION Right  06/15/2020   Procedure: PERIPHERAL VASCULAR INTERVENTION;  Surgeon: Waynetta Sandy, MD;  Location: [place] CV LAB;  Service: Cardiovascular;  Laterality: Right;   POLYPECTOMY  08/21/2018   Procedure: POLYPECTOMY;  Surgeon: Yetta Flock, MD;  Location: [place];  Service: Gastroenterology;;   [place] N/A 12/11/2012   Procedure: [place];  Surgeon: Serafina Mitchell, MD;  Location: [facility] CATH LAB;  Service: Cardiovascular;  Laterality: N/A;   WOUND EXPLORATION Left 06/15/2020   Procedure: GROIN  EXPLORATION;  Surgeon: Waynetta Sandy, MD;  Location: [place];  Service: Vascular;  Laterality: Left;    Social History  reports that he has been smoking cigarettes. He has a 5.00 pack-year smoking history. He has never used smokeless tobacco. He reports current alcohol use of about 10.0 standard drinks per week. He reports current drug use. Drug: Cocaine.  No Known Allergies  Family History  Problem Relation Age of Onset   Diabetes Father    Hypertension Father    Heart disease Father    Mother deceased age 21 patient unsure of what his mother passed from as he states he was in rehab when she passed.  Father deceased age 2 per patient from old age.  Prior to Admission medications   Medication Sig Start Date End Date Taking? Authorizing Provider  amiodarone (PACERONE) 200 MG tablet Take 200 mg by mouth daily.  06/17/21  Yes [provider]  amLODipine (NORVASC) 5 MG tablet Take 5 mg by mouth at bedtime. 07/22/21  Yes [provider]  calcitRIOL (ROCALTROL) 0.5 MCG capsule Take 4 capsules (2 mcg total) by mouth Every Tuesday,Thursday,and Saturday with dialysis. 05/27/21  Yes France Ravens, MD  carvedilol (COREG) 12.5 MG tablet Take 12.5 mg by mouth 2 (two) times daily. 07/06/21  Yes [provider]  rosuvastatin (CRESTOR) 10 MG tablet Take 1 tablet (10 mg total) by mouth daily. 12/11/20 09/13/21 Yes Mercy Riding, MD  sevelamer carbonate (RENVELA)  800 MG tablet Take 4 tablets (3,200 mg total) by mouth 3 (three) times daily with meals. Patient taking differently: Take 4,000 mg by mouth 3 (three) times daily with meals. 09/14/18  Yes Vasireddy, Grier Mitts, MD  albuterol (VENTOLIN HFA) 108 (90 Base) MCG/ACT inhaler Inhale 2 puffs into the lungs every 4 (four) hours as needed for shortness of breath or wheezing. Patient not taking: Reported on 09/13/2021 11/06/19   [provider]  cinacalcet (SENSIPAR) 30 MG tablet Take 6 tablets (180 mg total) by mouth Every Tuesday,Thursday,and Saturday with dialysis. 12/12/20   Mercy Riding, MD  Darbepoetin Alfa (ARANESP) 200 MCG/0.4ML SOSY injection Inject 0.4 mLs (200 mcg total) into the vein every Tuesday with hemodialysis. 05/25/21   France Ravens, MD  folic acid (FOLVITE) 1 MG tablet Take 1 tablet (1 mg total) by mouth daily. Patient not taking: Reported on 09/13/2021 12/12/20   Mercy Riding, MD  multivitamin (RENA-VIT) TABS tablet Take 1 tablet by mouth at bedtime. Patient not taking: Reported on 09/13/2021 05/25/21   France Ravens, MD  nicotine (NICODERM CQ - DOSED IN MG/24 HOURS) 14 mg/24hr patch Place 1 patch (14 mg total) onto the skin daily. Patient not taking: Reported on 09/13/2021 12/12/20   Mercy Riding, MD  thiamine 100 MG tablet Take 1 tablet (100 mg total) by mouth daily. Patient not taking: Reported on 05/21/2021 12/12/20   Mercy Riding, MD  apixaban (ELIQUIS) 5 MG TABS tablet Take 1 tablet (5 mg total) by mouth 2 (two) times daily. 06/22/20 08/03/20  Karoline Caldwell, PA-C    Physical Exam: Vitals:   09/13/21 1430 09/13/21 1500 09/13/21 1654 09/13/21 1700  BP: (!) 195/101 (!) 175/95 (!) 194/88 (!) 158/82  Pulse: 78 78  78  Resp: 15 (!) 23  18  Temp:      TempSrc:      SpO2: 98% 98%  98%    Constitutional: NAD, calm, comfortable Vitals:   09/13/21 1430 09/13/21 1500 09/13/21 1654 09/13/21 1700  BP: (!) 195/101 (!) 175/95 (!) 194/88 (!) 158/82  Pulse: 78 78  78  Resp: 15 (!) 23  18  Temp:       TempSrc:      SpO2: 98% 98%  98%   Eyes: PERRL, lids and conjunctivae normal ENMT: Mucous membranes are moist. Posterior pharynx clear of any exudate or lesions.Normal dentition.  Neck: normal, supple, no masses, no thyromegaly Respiratory: Diffuse crackles noted.  No wheezing.  Fair air movement.  No use of accessory muscles of respiration.  Speaking in full sentences. Cardiovascular: Regular rate and rhythm, no murmurs / rubs / gallops.  Trace to 1+ bilateral lower extremity edema. 2+ pedal pulses. No carotid bruits.  Abdomen: no tenderness, no masses palpated. No hepatosplenomegaly. Bowel sounds positive.  Musculoskeletal: no clubbing / cyanosis. No joint deformity upper and lower extremities. Good ROM, no contractures. Normal muscle tone.  Left upper extremity with  AV fistula with a positive bruit. Skin: no rashes, lesions, ulcers. No induration Neurologic: CN 2-12 grossly intact. Sensation intact, DTR normal. Strength 5/5 in all 4.  Slight tremors, minimal asterixis. Psychiatric: Normal judgment and insight. Alert and oriented x 3. Normal mood.   (Labs on Admission: I have personally reviewed following labs and imaging studies  CBC: Recent Labs  Lab 09/13/21 1030  WBC 3.1*  HGB 7.6*  HCT 23.8*  MCV 101.3*  PLT 956    Basic Metabolic Panel: Recent Labs  Lab 09/13/21 1030  NA 140  K 4.9  CL 92*  CO2 27  GLUCOSE 111*  BUN 102*  CREATININE 19.45*  CALCIUM 10.1    GFR: CrCl cannot be calculated (Unknown ideal weight.).  Liver Function Tests: No results for input(s): AST, ALT, ALKPHOS, BILITOT, PROT, ALBUMIN in the last 168 hours.  Urine analysis:    Component Value Date/Time   COLORURINE YELLOW 05/08/2010 [place] 05/08/2010 1335   LABSPEC 1.009 05/08/2010 1335   PHURINE 6.0 05/08/2010 1335   GLUCOSEU NEGATIVE 05/08/2010 1335   HGBUR NEGATIVE 05/08/2010 1335   BILIRUBINUR NEGATIVE 05/08/2010 1335   KETONESUR NEGATIVE 05/08/2010 1335    PROTEINUR 100 (A) 05/08/2010 1335   UROBILINOGEN 0.2 05/08/2010 1335   NITRITE NEGATIVE 05/08/2010 1335   LEUKOCYTESUR SMALL (A) 05/08/2010 1335    Radiological Exams on Admission: DG Chest 2 View  Result Date: 09/13/2021 CLINICAL DATA:  Chest pain EXAM: CHEST - 2 VIEW COMPARISON:  Radiograph 05/18/2021 FINDINGS: Unchanged enlarged cardiac silhouette. Shoulder pulmonary vascular congestion. No large pleural effusion. No visible pneumothorax. No acute osseous abnormality. Left upper extremity stent noted. IMPRESSION: Cardiomegaly with central pulmonary vascular congestion. Electronically Signed   By: Maurine Simmering M.D.   On: 09/13/2021 10:50    EKG: Independently reviewed.  Normal sinus rhythm, first-degree AV block, LVH.  Assessment and Plan: * Uremia -Secondary to hemodialysis noncompliance. - Patient presented to the ED with shortness of breath, stating he feels like he needs urgent hemodialysis as he is missed about 2-3 hemodialysis sessions.  Patient also with some complaints of intermittent chest pain. -Patient noted to have some asterixis on examination as well as some mild tremors. -Patient had some nausea and emesis 1 day prior to admission. -Patient seen in consultation by nephrology and patient for hemodialysis either tonight or tomorrow. -Per nephrology.  Hypertensive urgency - Secondary to medication noncompliance. -Per ED physician patient received a dose of labetalol in the ED. -We will resume patient's home regimen of Norvasc, Coreg. -Place on IV hydralazine as needed.  ESRD (end stage renal disease) ([place]) - Patient noted to have missed about 2-3 hemodialysis sessions per patient secondary to his crack cocaine dependence. -Patient seen in consultation by nephrology and patient for hemodialysis either tonight of tomorrow. -Per nephrology  Severe episode of recurrent major depressive disorder, without psychotic features ([place]) - Patient with no suicidal or homicidal  ideation. -Not on any medications prior to admission per med rec. -Follow.  Type 2 diabetes mellitus with other diabetic kidney complication ([place]) - Patient denies any history of diabetes mellitus however noted on the records. -Check a hemoglobin A1c. -Follow.  Melena - Patient stated had some melanotic stools this weekend. -Hemoglobin currently at 7.6 today. -Check FOBT. -Check an anemia panel. -Place on PPI twice daily. -Transfusion threshold hemoglobin < 7.   Polysubstance (excluding opioids) dependence ([place]) - Patient with crack cocaine dependence. -Patient motivated for cessation and asking for help for his crack  cocaine dependence. -Placed on the clonidine detox protocol. -Consult with TOC  Chest pain - Likely secondary to dyspnea/shortness of breath/volume overload secondary to missed hemodialysis sessions and hypertensive urgency. -Currently chest pain-free. -Cardiac enzymes mildly elevated but flattened and lower than prior troponins. -EKG with normal sinus rhythm with LVH with no ischemic changes noted. -Patient for HD. -Follow.  PAF (paroxysmal atrial fibrillation) (HCC) - Patient no history of paroxysmal atrial fibrillation on anticoagulation with Eliquis. -Patient noncompliant with medications. -Currently normal sinus rhythm in the ED. -Resume home regimen Coreg for rate control as well as amiodarone. -Patient did have some complaints of melanotic stools, noted to be anemic, hold Eliquis pending FOBT. -If FOBT is negative we will resume Eliquis.  Non-compliance with renal dialysis [facility]) - Patient for HD. -Importance of compliance with hemodialysis stressed to patient. -Per nephrology.  PAD (peripheral artery disease) (HCC) - Continue statin. -On Eliquis prior to admission however due to concerns for melanotic stool we will hold Eliquis for now.  Dyspnea - Likely secondary to volume overload from missed hemodialysis sessions and hypertensive  urgency. -Patient currently on 1-1/2 L nasal cannula with sats above 95% in the ED. -Patient seen by nephrology and patient for hemodialysis tonight or tomorrow. -Resume home antihypertensive medications.  ESRD on hemodialysis [facility]) - Noted on hemodialysis Tuesday Thursday Saturday on [employer]. -Noncompliance with hemodialysis. -Noted to have missed about 3 sessions of HD. -Nephrology following patient for HD today or tomorrow.  COPD (chronic obstructive pulmonary disease) (HCC) - Currently stable. -Place on Incruse, Dulera, Xopenex and Atrovent nebs as needed.  Anemia associated with stage 5 chronic renal failure ([place]) - Patient with a hemoglobin of 7.6 with last hemoglobin noted on epic at 9.0 on 05/28/2021 -Patient does endorse some melanotic stools. -Check an anemia panel. -FOBT. -Place on PPI twice daily. -Follow H&H. -Transfusion threshold hemoglobin < 7.  HYPERTENSION, BENIGN SYSTEMIC - See hypertensive urgency above.  HYPERCHOLESTEROLEMIA - Resume home regimen statin.         DVT prophylaxis: SCDs Code Status:   Full Family Communication:  Updated patient.  No family at bedside. Disposition Plan:   Patient is from:  Home  Anticipated DC to:  TBD  Anticipated DC date:  TBD  Anticipated DC barriers: Clinical improvement Consults called:  Nephrology: Dr. Jonnie Finner 09/13/2021 Admission status:  Admit to inpatient/progressive unit.  Severity of Illness: The appropriate patient status for this patient is INPATIENT. Inpatient status is judged to be reasonable and necessary in order to provide the required intensity of service to ensure the patient's safety. The patient's presenting symptoms, physical exam findings, and initial radiographic and laboratory data in the context of their chronic comorbidities is felt to place them at high risk for further clinical deterioration. Furthermore, it is not anticipated that the patient will be medically stable for discharge from  the hospital within 2 midnights of admission.   * I certify that at the point of admission it is my clinical judgment that the patient will require inpatient hospital care spanning beyond 2 midnights from the point of admission due to high intensity of service, high risk for further deterioration and high frequency of surveillance required.*    Irine Seal MD [facility]  How to contact the [facility] Attending or Consulting provider [place] or covering provider during after hours [place], for this patient?   Check the care team in [facility] and look for a) attending/consulting [facility] provider listed and b) the [facility] team listed Log  into [email or web address] and use [place]'s universal password to access. If you do not have the password, please contact the hospital operator. Locate the [facility] provider you are looking for under [facility] and page to a number that you can be directly reached. If you still have difficulty reaching the provider, please page the [facility] (Director on Call) for the Hospitalists listed on amion for assistance.  09/13/2021, 5:29 PM

## 2021-09-13 NOTE — Assessment & Plan Note (Addendum)
-  Patient with a hemoglobin of 7.6 (09/13/2021) with last hemoglobin noted on epic at 9.0 on 05/28/2021 ?-Patient does endorse some melanotic stools. ?-Anemia panel consistent with anemia of chronic disease as above ?-Repeat Hgb/Hct today is 7.9/25.5 ?-FOBT Negative  ?-Continue PPI twice daily. ?-Follow H&H. ?-Transfusion threshold hemoglobin < 7. ?

## 2021-09-13 NOTE — Assessment & Plan Note (Addendum)
-  Continue statin. ?-Previously on Eliquis but no longer taking it now given hx of Symptomatic Anemia and Hx of GI Bleeds ?-Continue to Monitor off of AC  ?

## 2021-09-13 NOTE — Assessment & Plan Note (Addendum)
-  Continue Rosuvastatin 10 mg po Daily . ?

## 2021-09-13 NOTE — Assessment & Plan Note (Addendum)
-   Likely secondary to volume overload from missed hemodialysis sessions and hypertensive urgency. ?-Patient currently 97% on room air. ?-SpO2: 95 % ?O2 Flow Rate (L/min): 2 L/min ?-Patient seen by nephrology and patient underwent hemodialysis yesterday and this morning.   ?-Next scheduled dialysis session is on 09/16/2021 ?-Resume home antihypertensive medications. ?-Continue Carvedilol 12.5 mg po BID and Amlodipine 5 mg po qHS ?

## 2021-09-13 NOTE — Assessment & Plan Note (Addendum)
-  Likely secondary to dyspnea/shortness of breath/volume overload secondary to missed hemodialysis sessions and hypertensive urgency. ?-Currently chest pain-free. ?-Cardiac enzymes mildly elevated but flattened and lower than prior troponins. ?-EKG with normal sinus rhythm with LVH with no ischemic changes noted. ?-Patient undergoing HD tomorrow ?-His chest pain is resolved and will need to continue monitor carefully ?

## 2021-09-13 NOTE — Assessment & Plan Note (Addendum)
-   Patient denies any history of diabetes mellitus however noted on the records. ?-Hemoglobin A1c 4.6.   ?-Blood sugars have been ranging from 85-111 on daily BMP and CBGs ranging from 68-93 ?

## 2021-09-13 NOTE — Assessment & Plan Note (Addendum)
-   Patient with no suicidal or homicidal ideation. ?-Not on any medications prior to admission per med rec. ?-Outpatient follow-up with Psychiatry ?-Continue to Follow  ?

## 2021-09-13 NOTE — ED Triage Notes (Signed)
Patient here with complaint of shortness of breath and chest pain, history of dialysis and has missed the last three of his appointments due to using crack instead of going to appointment. Last use of crack yesterday. Patient is alert, oriented, speaking in complete sentences, and is in no apparent distress at this time. ?

## 2021-09-13 NOTE — Assessment & Plan Note (Addendum)
-  Has hx of PAF ?-Patient noncompliant with medications. ?-Currently normal sinus rhythm in the ED. ?-Continue home regimen Carvedilol 12.5 mg po BID for rate control as well as Amiodarone 200 mg po Daily  ?-Patient did have some complaints of melanotic stools, noted to be anemic; No longer taking Anticoagulation with Apixaban given prior Symptomatic anemia/GIB: Hx gastric/small bowel AVMs. He then had nosebleeds x2 Nov 2022 requiring ER visits.  ?-FOBT is negative  ?-Hemoglobin/hematocrit is relatively stable and went from 7.4/23.5 -> 8.7/27.8 -> 8.1/25.5 -> 7.9/25.5 with an MCV of 103.2 ?-Continue to Monitor for S/Sx of Bleeding  ?

## 2021-09-13 NOTE — Assessment & Plan Note (Addendum)
Noted on hemodialysis Tuesday Thursday Saturday on [employer]. ?-Has had Noncompliance with hemodialysis. ?-Noted to have missed about 3 sessions of HD. ?-Nephrology following patient and patient underwent hemodialysis the day before yesterday and yesterday.  Nephrology plans to get the patient back on regular hemodialysis schedule on Tuesday Thursday Saturdays ?

## 2021-09-13 NOTE — Assessment & Plan Note (Addendum)
-  See hypertensive urgency above. ?

## 2021-09-13 NOTE — Assessment & Plan Note (Addendum)
-  Patient noted to have missed about 2-3 hemodialysis sessions per patient secondary to his crack cocaine dependence. ?-Patient seen in consultation by nephrology and patient underwent hemodialysis last night and this morning.   ?-Continue calcitriol, Sensipar, aranesp, Rena-Vite, Renvela. ?-Further Care Per nephrology and getting dialysis today.  ?-Patient's BUN/Cr is now 14/6.96 ?

## 2021-09-14 DIAGNOSIS — I739 Peripheral vascular disease, unspecified: Secondary | ICD-10-CM

## 2021-09-14 DIAGNOSIS — R0602 Shortness of breath: Secondary | ICD-10-CM

## 2021-09-14 DIAGNOSIS — F1410 Cocaine abuse, uncomplicated: Secondary | ICD-10-CM

## 2021-09-14 LAB — HEPATITIS B SURFACE ANTIGEN: Hepatitis B Surface Ag: NONREACTIVE

## 2021-09-14 LAB — RENAL FUNCTION PANEL
Albumin: 2.9 g/dL — ABNORMAL LOW (ref 3.5–5.0)
Anion gap: 9 (ref 5–15)
BUN: 31 mg/dL — ABNORMAL HIGH (ref 8–23)
CO2: 28 mmol/L (ref 22–32)
Calcium: 9.4 mg/dL (ref 8.9–10.3)
Chloride: 98 mmol/L (ref 98–111)
Creatinine, Ser: 8.8 mg/dL — ABNORMAL HIGH (ref 0.61–1.24)
GFR, Estimated: 6 mL/min — ABNORMAL LOW (ref >60)
Glucose, Bld: 110 mg/dL — ABNORMAL HIGH (ref 70–99)
Phosphorus: 5.3 mg/dL — ABNORMAL HIGH (ref 2.5–4.6)
Potassium: 3.7 mmol/L (ref 3.5–5.1)
Sodium: 135 mmol/L (ref 135–145)

## 2021-09-14 LAB — FERRITIN: Ferritin: 148 ng/mL (ref 24–336)

## 2021-09-14 LAB — IRON AND TIBC
Iron: 60 ug/dL (ref 45–182)
Saturation Ratios: 21 % (ref 17.9–39.5)
TIBC: 284 ug/dL (ref 250–450)
UIBC: 224 ug/dL

## 2021-09-14 LAB — CBC
HCT: 23.5 % — ABNORMAL LOW (ref 39.0–52.0)
Hemoglobin: 7.4 g/dL — ABNORMAL LOW (ref 13.0–17.0)
MCH: 31.5 pg (ref 26.0–34.0)
MCHC: 31.5 g/dL (ref 30.0–36.0)
MCV: 100 fL (ref 80.0–100.0)
Platelets: 147 10*3/uL — ABNORMAL LOW (ref 150–400)
RBC: 2.35 MIL/uL — ABNORMAL LOW (ref 4.22–5.81)
RDW: 19.8 % — ABNORMAL HIGH (ref 11.5–15.5)
WBC: 2.7 10*3/uL — ABNORMAL LOW (ref 4.0–10.5)
nRBC: 0 % (ref 0.0–0.2)

## 2021-09-14 LAB — HEMOGLOBIN A1C
Hgb A1c MFr Bld: 4.6 % — ABNORMAL LOW (ref 4.8–5.6)
Mean Plasma Glucose: 85.32 mg/dL

## 2021-09-14 LAB — GLUCOSE, CAPILLARY: Glucose-Capillary: 89 mg/dL (ref 70–99)

## 2021-09-14 LAB — MAGNESIUM: Magnesium: 2.2 mg/dL (ref 1.7–2.4)

## 2021-09-14 LAB — VITAMIN B12: Vitamin B-12: 633 pg/mL (ref 180–914)

## 2021-09-14 LAB — HEPATITIS B CORE ANTIBODY, TOTAL: Hep B Core Total Ab: NONREACTIVE

## 2021-09-14 LAB — FOLATE: Folate: 15.4 ng/mL (ref >5.9)

## 2021-09-14 MED ORDER — CALCITRIOL 0.25 MCG PO CAPS
2.0000 ug | ORAL_CAPSULE | ORAL | Status: DC
Start: 2021-09-16 — End: 2021-09-14

## 2021-09-14 MED ORDER — SODIUM CHLORIDE 0.9 % IV BOLUS
250.0000 mL | Freq: Once | INTRAVENOUS | Status: AC
  Administered 2021-09-14: 250 mL via INTRAVENOUS

## 2021-09-14 MED ORDER — DARBEPOETIN ALFA 200 MCG/0.4ML IJ SOSY: 200.0000 ug | PREFILLED_SYRINGE | INTRAMUSCULAR | Status: DC

## 2021-09-14 NOTE — Progress Notes (Addendum)
[facility] ?Progress Note ? ?Subjective: no c/o today but cramped after HD  ? ?Vitals:  ? 09/14/21 0930 09/14/21 1000 09/14/21 1040 09/14/21 1122  ?BP: 119/82 (!) 98/47 110/74 (!) 135/92  ?Pulse: 77 78 75 80  ?Resp: '19 14 15 18  '$ ?Temp:   97.9 ?F (36.6 ?C) 98 ?F (36.7 ?C)  ?TempSrc:   Temporal Oral  ?SpO2: 100% 100% 98% 99%  ?Weight:   64.5 kg   ?Height:      ? ? ?Exam: ? alert, nad  ? no jvd ? Chest cta bilat ? Cor reg no RG ? Abd soft ntnd no ascites ?  Ext no LE edema ?  Alert, NF, ox3 ?  LUA aVF+bruit ? ? ? Home meds include - amiodarone, norvasc, coreg 12.5 bid, crestor, renvela 4 ac tid, rena-vit, nicotine patch, prns/ vits/ supps ?  ?  ?  ? OP HD: [facility] TTS ?  4h  400/1.5  67kg  2/2 bath  L AVF  Hep none ? - rocaltrol 2 ug tiw po ? - mircera 200 mcq g q2 wks, last esa 150 mcg on 2/14 ? - home; renvela 4 ac tid ?  ? ?   CXR - IMPRESSION: Cardiomegaly with central pulmonary vascular congestion. ?  ?Assessment/ Plan: ?Chest pain  - prob secondary to missed HD, volume excess. No c/o today. Per pmd ?Cocaine - hx of abuse. Asking for help.  ?ESRD - on HD TTS.  Missed HD x 3 w/ sig azotemia. Had HD 3/06 > creat down today. Pt looks better. Plan HD today to get back on schedule.   ?HTN /vol - cont home meds. Pt cramped after HD, prob pulled him to low as is 2.5kg under post treatment today.  ? 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 Ca and phos in range.  ?Anemia ckd - Hb here is low at 7.6.  Due for ESA, will order darbe 200 ug SQ tonight then weekly while here. ?  ? ? ? ? ?Rob Doctor, hospital ?09/14/2021, 3:14 PM ? ? ?Recent Labs  ?Lab 09/13/21 ?1030 09/14/21 ?0159  ?K 4.9 3.7  ?BUN 102* 31*  ?CREATININE 19.45* 8.80*  ?ALBUMIN  --  2.9*  ?CALCIUM 10.1 9.4  ?PHOS  --  5.3*  ?HGB 7.6* 7.4*  ? ?Inpatient medications: ? amiodarone  200 mg Oral Daily  ? amLODipine  5 mg Oral QHS  ? calcitRIOL  2 mcg Oral Q T,Th,Sa-HD  ? carvedilol  12.5 mg Oral BID  ? Chlorhexidine Gluconate Cloth  6  each Topical Q0600  ? cinacalcet  180 mg Oral Q T,Th,Sa-HD  ? Darbepoetin Alfa  200 mcg Intravenous Q Tue-HD  ? folic acid  1 mg Oral Daily  ? mometasone-formoterol  2 puff Inhalation BID  ? multivitamin  1 tablet Oral QHS  ? nicotine  14 mg Transdermal Daily  ? pantoprazole  40 mg Oral BID AC  ? rosuvastatin  10 mg Oral Daily  ? sevelamer carbonate  4,000 mg Oral TID WC  ? sodium chloride flush  3 mL Intravenous Q12H  ? sodium chloride flush  3 mL Intravenous Q12H  ? thiamine  100 mg Oral Daily  ? umeclidinium bromide  1 puff Inhalation Daily  ? ? sodium chloride    ? ?sodium chloride, acetaminophen **OR** acetaminophen, dicyclomine, hydrALAZINE, hydrOXYzine, ipratropium, levalbuterol, loperamide, methocarbamol, ondansetron **OR** ondansetron (ZOFRAN) IV, oxyCODONE, polyethylene glycol, sodium chloride flush, sorbitol ? ? ? ? ? ? ?

## 2021-09-14 NOTE — NC FL2 (Signed)
[place] MEDICAID FL2 LEVEL OF CARE SCREENING TOOL     IDENTIFICATION  Patient Name: Raymond [name] Birthdate: May 27, 1956 Sex: male Admission Date (Current Location): 09/13/2021  [facility] and [place] Number:  Herbalist and Address:  [facility], [place] [address] [place], [place] [address]      Provider Number: [id number]  Attending Physician Name and Address:  Eugenie Filler, MD  Relative Name and Phone Number:       Current Level of Care: Hospital Recommended Level of Care: [place] Prior Approval Number:    Date Approved/Denied:   PASRR Number: [id number]  Discharge Plan: SNF    Current Diagnoses: Patient Active Problem List   Diagnosis Date Noted   Hypertensive urgency 09/13/2021   Uncontrolled hypertension    Uremia    Severe episode of recurrent major depressive disorder, without psychotic features ([place])    ESRD (end stage renal disease) on dialysis ([place]) 05/18/2021   Cardiac arrest ([place]) 04/03/2021   Anisocoria 04/03/2021   HFrEF (heart failure with reduced ejection fraction) ([place]) 04/03/2021   Hypothermia not due to cold exposure 04/03/2021   Junctional bradycardia 04/03/2021   Pancytopenia ([place]) 01/25/2021   AMS (altered mental status) 01/19/2021   Chronic viral hepatitis C ([place]) 12/25/2020   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 ([place]) 08/05/2020   Acute upper GI bleed 56/21/3086   Acute metabolic encephalopathy 57/84/6962   COVID-19 virus infection 07/29/2020   AVM (arteriovenous malformation) of stomach, acquired with hemorrhage    AVM (arteriovenous malformation) of small bowel, acquired with hemorrhage    Current use of long term anticoagulation    Sensorineural hearing loss (SNHL) of both ears 03/30/2020   History of arteriovenous malformation (AVM) 01/14/2020   UGIB (upper gastrointestinal bleed) 01/14/2020   Acute blood loss anemia 01/14/2020   Elevated troponin 01/14/2020    Allergy, unspecified, initial encounter 04/29/2019   Gastroenteritis 12/24/2018   Substance use disorder 12/24/2018   Type 2 diabetes mellitus with other diabetic kidney complication ([place]) 95/28/4132   Noncompliance of patient with renal dialysis ([place]) 09/12/2018   Benign neoplasm of colon    Melena    Anemia due to chronic kidney disease, on chronic dialysis ([place])    Anemia    H/O medication noncompliance    Polysubstance (excluding opioids) dependence ([place])    Atrial flutter with rapid ventricular response ([place]) 08/10/2018   Chest pain 08/10/2018   PAF (paroxysmal atrial fibrillation) ([place]) 08/01/2018   Acute respiratory failure with hypoxia ([place]) 07/13/2018   Non-compliance with renal dialysis ([place]) 07/13/2018   PAD (peripheral artery disease) ([place]) 04/11/2018   Sepsis ([place]) 03/31/2018   Preop cardiovascular exam 03/29/2018   Idiopathic gout, unspecified site 12/13/2017   Other specified complication of vascular prosthetic devices, implants and grafts, initial encounter ([place]) 11/03/2017   Volume overload 08/07/2017   Acute pulmonary edema (HCC)    Dyspnea 06/20/2017   Acute bronchitis    COPD (chronic obstructive pulmonary disease) ([place])    ESRD on hemodialysis ([place])    Hypoxia 12/05/2016   Hyperkalemia 12/19/2015   Hypoglycemia 12/19/2015   Polysubstance abuse ([place]) 12/19/2015   Homeless 12/19/2015   Anemia associated with stage 5 chronic renal failure ([place]) 12/19/2015   BPH without urinary obstruction 10/29/2015   Erectile dysfunction 10/29/2015   Hematuria, gross 10/29/2015   Hypercalcemia 11/07/2014   Pruritus, unspecified 10/17/2014   Coagulation defect, unspecified ([place]) 09/17/2014   Atypical chest pain 12/19/2013  Thrombocytopenia, unspecified ([place]) 10/17/2013   Iron deficiency anemia, unspecified 08/28/2013   Atherosclerosis of native arteries of extremity with intermittent claudication ([place]) 12/04/2012   ESRD (end stage renal disease) ([place]) 12/04/2012    Hypertensive chronic kidney disease with stage 5 chronic kidney disease or end stage renal disease (HCC) 11/20/2012   Secondary hyperparathyroidism of renal origin ([place]) 11/20/2012   HEPATITIS C 09/07/2006   HYPERCHOLESTEROLEMIA 09/07/2006   HYPERTENSION, BENIGN SYSTEMIC 09/07/2006    Orientation RESPIRATION BLADDER Height & Weight     Self, Time, Situation, Place  Normal Continent Weight: 142 lb 3.2 oz (64.5 kg) Height:  '5\' 8"'$  (172.7 cm)  BEHAVIORAL SYMPTOMS/MOOD NEUROLOGICAL BOWEL NUTRITION STATUS      Continent Diet (Please see discharge summary)  AMBULATORY STATUS COMMUNICATION OF NEEDS Skin   Supervision Verbally Normal                       Personal Care Assistance Level of Assistance  Bathing, Feeding, Dressing Bathing Assistance: Limited assistance Feeding assistance: Independent Dressing Assistance: Limited assistance     Functional Limitations Info  Sight, Hearing, Speech Sight Info: Adequate Hearing Info: Adequate Speech Info: Adequate    SPECIAL CARE FACTORS FREQUENCY  PT (By licensed PT), OT (By licensed OT)     PT Frequency: 5x per day OT Frequency: 5x per day            Contractures Contractures Info: Not present    Additional Factors Info  Code Status, Allergies Code Status Info: FULL Allergies Info: NKA           Current Medications (09/14/2021):  This is the current hospital active medication list Current Facility-Administered Medications  Medication Dose Route Frequency Provider Last Rate Last Admin   0.9 %  sodium chloride infusion  250 mL Intravenous PRN Eugenie Filler, MD       acetaminophen (TYLENOL) tablet 650 mg  650 mg Oral Q6H PRN Eugenie Filler, MD       Or   acetaminophen (TYLENOL) suppository 650 mg  650 mg Rectal Q6H PRN Eugenie Filler, MD       amiodarone (PACERONE) tablet 200 mg  200 mg Oral Daily Eugenie Filler, MD   200 mg at 09/14/21 1151   amLODipine (NORVASC) tablet 5 mg  5 mg Oral QHS Eugenie Filler, MD       calcitRIOL (ROCALTROL) capsule 2 mcg  2 mcg Oral Q T,Th,Sa-HD Eugenie Filler, MD       carvedilol (COREG) tablet 12.5 mg  12.5 mg Oral BID Eugenie Filler, MD   12.5 mg at 09/14/21 1151   Chlorhexidine Gluconate Cloth 2 % PADS 6 each  6 each Topical Q0600 Eugenie Filler, MD   6 each at 09/14/21 1152   cinacalcet (SENSIPAR) tablet 180 mg  180 mg Oral Q T,Th,Sa-HD Eugenie Filler, MD   180 mg at 09/14/21 1150   Darbepoetin Alfa (ARANESP) injection 200 mcg  200 mcg Intravenous Q Tue-HD Eugenie Filler, MD   200 mcg at 09/14/21 1152   dicyclomine (BENTYL) tablet 20 mg  20 mg Oral Q6H PRN Eugenie Filler, MD       folic acid (FOLVITE) tablet 1 mg  1 mg Oral Daily Eugenie Filler, MD   1 mg at 09/14/21 1151   hydrALAZINE (APRESOLINE) injection 10 mg  10 mg Intravenous Q6H PRN Eugenie Filler, MD   10 mg at 09/13/21 1654  hydrOXYzine (ATARAX) tablet 25 mg  25 mg Oral Q6H PRN Eugenie Filler, MD   25 mg at 09/13/21 2336   ipratropium (ATROVENT) nebulizer solution 0.5 mg  0.5 mg Nebulization Q2H PRN Eugenie Filler, MD       levalbuterol Penne Lash) nebulizer solution 0.63 mg  0.63 mg Nebulization Q2H PRN Eugenie Filler, MD       loperamide (IMODIUM) capsule 2-4 mg  2-4 mg Oral PRN Eugenie Filler, MD       methocarbamol (ROBAXIN) tablet 500 mg  500 mg Oral Q8H PRN Eugenie Filler, MD       mometasone-formoterol [facility]) 200-5 MCG/ACT inhaler 2 puff  2 puff Inhalation BID Eugenie Filler, MD   2 puff at 09/14/21 1203   multivitamin (RENA-VIT) tablet 1 tablet  1 tablet Oral QHS Eugenie Filler, MD   1 tablet at 09/13/21 2336   naproxen (NAPROSYN) tablet 500 mg  500 mg Oral BID PRN Eugenie Filler, MD       nicotine (NICODERM CQ - dosed in mg/24 hours) patch 14 mg  14 mg Transdermal Daily Eugenie Filler, MD   14 mg at 09/14/21 1153   ondansetron (ZOFRAN) tablet 4 mg  4 mg Oral Q6H PRN Eugenie Filler, MD       Or   ondansetron  [facility]) injection 4 mg  4 mg Intravenous Q6H PRN Eugenie Filler, MD       oxyCODONE (Oxy IR/ROXICODONE) immediate release tablet 5 mg  5 mg Oral Q4H PRN Eugenie Filler, MD   5 mg at 09/14/21 0020   pantoprazole (PROTONIX) EC tablet 40 mg  40 mg Oral BID AC Eugenie Filler, MD   40 mg at 09/14/21 1151   polyethylene glycol (MIRALAX / GLYCOLAX) packet 17 g  17 g Oral Daily PRN Eugenie Filler, MD       rosuvastatin (CRESTOR) tablet 10 mg  10 mg Oral Daily Eugenie Filler, MD   10 mg at 09/14/21 1153   sevelamer carbonate (RENVELA) tablet 4,000 mg  4,000 mg Oral TID WC Eugenie Filler, MD   4,000 mg at 09/14/21 1150   sodium chloride flush (NS) 0.9 % injection 3 mL  3 mL Intravenous Q12H Eugenie Filler, MD       sodium chloride flush (NS) 0.9 % injection 3 mL  3 mL Intravenous Q12H Eugenie Filler, MD   3 mL at 09/14/21 1153   sodium chloride flush (NS) 0.9 % injection 3 mL  3 mL Intravenous PRN Eugenie Filler, MD       sorbitol 70 % solution 30 mL  30 mL Oral Daily PRN Eugenie Filler, MD       thiamine tablet 100 mg  100 mg Oral Daily Eugenie Filler, MD   100 mg at 09/14/21 1150   umeclidinium bromide (INCRUSE ELLIPTA) 62.5 MCG/ACT 1 puff  1 puff Inhalation Daily Eugenie Filler, MD   1 puff at 09/14/21 1203   Facility-Administered Medications Ordered in Other Encounters  Medication Dose Route Frequency Provider Last Rate Last Admin   midazolam (VERSED) 5 MG/5ML injection    Anesthesia Intra-op Sammie Bench, CRNA   2 mg at 04/11/18 1042     Discharge Medications: Please see discharge summary for a list of discharge medications.  Relevant Imaging Results:  Relevant Lab Results:   Additional Information SSN [id number] dialysis TTS @ [facility]  10:30am  Moderna COVID-19 Vaccine 10/03/2019 , 09/05/2019 Pfizer COVID-19 Vaccine 04/14/2020  Vinie Sill, LCSW

## 2021-09-14 NOTE — TOC Initial Note (Signed)
Transition of Care (TOC) - Initial/Assessment Note  ? ? ?Patient Details  ?Name: Raymond [name] ?MRN: [id number] ?Date of Birth: 1956-07-11 ? ?Transition of Care (TOC) CM/SW Contact:    ?Vinie Sill, LCSW ?Phone Number: ?09/14/2021, 1:30 PM ? ?Clinical Narrative:                 ? ?CSW met with patient at bedside. CSW introduced self and explained role. CSW discussed therapy recommendation of short term rehab at [facility]. CSW advised substance abuse maybe a barrier to placement, along with the need for dialysis(TTS @ [employer] transportation). Patient states understanding. Patient shared being on dialysis make him depressed. He wants to stop using drugs but states it is challenging because of the people he "hangs around". He is open to receiving substance abuse and mental health resources. CSW explained will seek to get short term rehab first and IF he get an offer he can then look into treatment for substance abuse. Patient states understanding.  ? ?He reports he used to live with his niece but can no longer stay there. Before admission he has been staying with different friends. 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  ? ?TOC will continue to follow and assist with discharge planning. ? ?Thurmond Butts, MSW, LCSW ?Clinical Social Worker ? ? ? ?Expected Discharge Plan: [place] ?Barriers to Discharge: Ship broker, Continued Medical Work up, SNF Pending bed offer ? ? ?Patient Goals and CMS Choice ?  ?  ?  ? ?Expected Discharge Plan and Services ?Expected Discharge Plan: [place] ?In-house Referral: Clinical Social Work ?  ?  ?Living arrangements for the past 2 months:  (different friends) ?                ?  ?  ?  ?  ?  ?  ?  ?  ?  ?  ? ?Prior Living Arrangements/Services ?Living arrangements for the past 2 months:  (different friends) ?Lives with:: Friends ?  ?       ?Need for Family Participation in Patient Care: Yes (Comment) ?Care giver support  system in place?: Yes (comment) ?  ?Criminal Activity/Legal Involvement Pertinent to Current Situation/Hospitalization: No - Comment as needed ? ?Activities of Daily Living ?Home Assistive Devices/Equipment: None ?ADL Screening (condition at time of admission) ?Patient's cognitive ability adequate to safely complete daily activities?: Yes ?Is the patient deaf or have difficulty hearing?: No ?Does the patient have difficulty seeing, even when wearing glasses/contacts?: No ?Does the patient have difficulty concentrating, remembering, or making decisions?: No ?Patient able to express need for assistance with ADLs?: Yes ?Does the patient have difficulty dressing or bathing?: No ?Independently performs ADLs?: Yes (appropriate for developmental age) ?Does the patient have difficulty walking or climbing stairs?: No ?Weakness of Legs: None ?Weakness of Arms/Hands: None ? ?Permission Sought/Granted ?  ?  ?   ?   ?   ?   ? ?Emotional Assessment ?  ?Attitude/Demeanor/Rapport: Engaged ?Affect (typically observed): Appropriate ?Orientation: : Oriented to Self, Oriented to Place, Oriented to  Time, Oriented to Situation ?Alcohol / Substance Use: Illicit Drugs ?Psych Involvement: No (comment) ? ?Admission diagnosis:  Uremia [N19] ?Cocaine abuse ([place]) [F14.10] ?Uncontrolled hypertension [I10] ?Chronic kidney disease, unspecified CKD stage [N18.9] ?Patient Active Problem List  ? Diagnosis Date Noted  ? Hypertensive urgency 09/13/2021  ? Uncontrolled hypertension   ? Uremia   ? Severe episode of recurrent major depressive disorder, without psychotic features ([place])   ?  ESRD (end stage renal disease) on dialysis ([place]) 05/18/2021  ? Cardiac arrest ([place]) 04/03/2021  ? Anisocoria 04/03/2021  ? HFrEF (heart failure with reduced ejection fraction) ([place]) 04/03/2021  ? Hypothermia not due to cold exposure 04/03/2021  ? Junctional bradycardia 04/03/2021  ? Pancytopenia ([place]) 01/25/2021  ? AMS (altered mental status) 01/19/2021  ? Chronic viral  hepatitis C ([place]) 12/25/2020  ? 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 ([place]) 08/05/2020  ? Acute upper GI bleed 07/29/2020  ? Acute metabolic encephalopathy 38/33/3832  ? COVID-19 virus infection 07/29/2020  ? AVM (arteriovenous malformation) of stomach, acquired with hemorrhage   ? AVM (arteriovenous malformation) of small bowel, acquired with hemorrhage   ? Current use of long term anticoagulation   ? Sensorineural hearing loss (SNHL) of both ears 03/30/2020  ? History of arteriovenous malformation (AVM) 01/14/2020  ? UGIB (upper gastrointestinal bleed) 01/14/2020  ? Acute blood loss anemia 01/14/2020  ? Elevated troponin 01/14/2020  ? Allergy, unspecified, initial encounter 04/29/2019  ? Gastroenteritis 12/24/2018  ? Substance use disorder 12/24/2018  ? Type 2 diabetes mellitus with other diabetic kidney complication ([place]) 91/91/6606  ? Noncompliance of patient with renal dialysis ([place]) 09/12/2018  ? Benign neoplasm of colon   ? Melena   ? Anemia due to chronic kidney disease, on chronic dialysis ([place])   ? Anemia   ? H/O medication noncompliance   ? Polysubstance (excluding opioids) dependence ([place])   ? Atrial flutter with rapid ventricular response ([place]) 08/10/2018  ? Chest pain 08/10/2018  ? PAF (paroxysmal atrial fibrillation) ([place]) 08/01/2018  ? Acute respiratory failure with hypoxia ([place]) 07/13/2018  ? Non-compliance with renal dialysis ([place]) 07/13/2018  ? PAD (peripheral artery disease) ([place]) 04/11/2018  ? Sepsis ([place]) 03/31/2018  ? Preop cardiovascular exam 03/29/2018  ? Idiopathic gout, unspecified site 12/13/2017  ? Other specified complication of vascular prosthetic devices, implants and grafts, initial encounter ([place]) 11/03/2017  ? Volume overload 08/07/2017  ? Acute pulmonary edema (HCC)   ? Dyspnea 06/20/2017  ? Acute bronchitis   ? COPD (chronic obstructive pulmonary disease) ([place])   ? ESRD on hemodialysis ([place])   ? Hypoxia 12/05/2016  ? Hyperkalemia 12/19/2015  ? Hypoglycemia  12/19/2015  ? Polysubstance abuse ([place]) 12/19/2015  ? Homeless 12/19/2015  ? Anemia associated with stage 5 chronic renal failure ([place]) 12/19/2015  ? BPH without urinary obstruction 10/29/2015  ? Erectile dysfunction 10/29/2015  ? Hematuria, gross 10/29/2015  ? Hypercalcemia 11/07/2014  ? Pruritus, unspecified 10/17/2014  ? Coagulation defect, unspecified ([place]) 09/17/2014  ? Atypical chest pain 12/19/2013  ? Thrombocytopenia, unspecified ([place]) 10/17/2013  ? Iron deficiency anemia, unspecified 08/28/2013  ? Atherosclerosis of native arteries of extremity with intermittent claudication ([place]) 12/04/2012  ? ESRD (end stage renal disease) ([place]) 12/04/2012  ? Hypertensive chronic kidney disease with stage 5 chronic kidney disease or end stage renal disease ([place]) 11/20/2012  ? Secondary hyperparathyroidism of renal origin ([place]) 11/20/2012  ? HEPATITIS C 09/07/2006  ? HYPERCHOLESTEROLEMIA 09/07/2006  ? HYPERTENSION, BENIGN SYSTEMIC 09/07/2006  ? ?PCP:  [name], FNP ?Pharmacy:   ?[facility] ?[address] ?[place] [address] ?Phone: [phone] Fax: [phone] ? ? ? ? ?Social Determinants of Health (SDOH) Interventions ?  ? ?Readmission Risk Interventions ?Readmission Risk Prevention Plan 06/22/2020  ?Transportation Screening Complete  ?Medication Review Press photographer) Complete  ?PCP or Specialist appointment within 3-5 days of discharge Complete  ?[place] or Home Care Consult Complete  ?SW Recovery  Care/Counseling Consult Complete  ?Palliative Care Screening Not Applicable  ?[place] Patient Refused  ?Some recent data might be hidden  ? ? ? ?

## 2021-09-14 NOTE — Evaluation (Addendum)
Physical Therapy Evaluation ?Patient Details ?Name: Raymond [name] ?MRN: [id number] ?DOB: [birth year] ?Today's Date: 09/14/2021 ? ?History of Present Illness ? 66 y.o. male presents to [facility] hospital 3/6 for worsening SOB, intermittent chest pain, bout of nausea and emesis, and some LE edema. Pt with + cocaine and missed HD sessions. Pt with 4 admissions since June 2022 for AMS and hypoglycemia, with PEA arrest during September admission. PMH includes ESRD on HDU TTS, COPD, hep C, HTN, CAD, PAD, COPD, crack cocaine dependence, A-fib, and medical noncompliance.  ?Clinical Impression ? Patient presents with generalized weakness, deconditioning, pain, decreased activity tolerance and impaired mobility s/p above. Pt currently has been living in a hotel but reports he can no longer afford it and has no where to live. Today, pt tolerated transfers and ambulation with Min guard assist and use of RW for support. Reports he no longer has a RW and needs one to get around. Fatigued after 25 feet with pain in LLE. Abnormal BP response to activity with pre activity BP 136/92 and post activity BP 114/70. Reports HD "took off too much fluid."  Would benefit from SNF to maximize independence and mobility with interest in drug rehab as well once his mobility improves. Will follow acutely. ?   ? ?Recommendations for follow up therapy are one component of a multi-disciplinary discharge planning process, led by the attending physician.  Recommendations may be updated based on patient status, additional functional criteria and insurance authorization. ? ?Follow Up Recommendations Skilled nursing-short term rehab (<3 hours/day) ? ?  ?Assistance Recommended at Discharge Intermittent Supervision/Assistance  ?Patient can return home with the following ? A little help with walking and/or transfers;A little help with bathing/dressing/bathroom;Help with stairs or ramp for entrance;Assist for transportation;Assistance with cooking/housework ? ?   ?Equipment Recommendations Rolling walker (2 wheels)  ?Recommendations for Other Services ?    ?  ?Functional Status Assessment Patient has had a recent decline in their functional status and demonstrates the ability to make significant improvements in function in a reasonable and predictable amount of time.  ? ?  ?Precautions / Restrictions Precautions ?Precautions: Fall;Other (comment) ?Precaution Comments: watch BP ?Restrictions ?Weight Bearing Restrictions: No  ? ?  ? ?Mobility ? Bed Mobility ?Overal bed mobility: Modified Independent ?  ?  ?  ?  ?  ?  ?General bed mobility comments: increased time ?  ? ?Transfers ?Overall transfer level: Needs assistance ?Equipment used: Rolling walker (2 wheels) ?Transfers: Sit to/from Stand ?Sit to Stand: Min guard ?  ?  ?  ?  ?  ?General transfer comment: Min guard for safety. Stood from EOB x1, slow to rise. ?  ? ?Ambulation/Gait ?Ambulation/Gait assistance: Min guard ?Gait Distance (Feet): 25 Feet ?Assistive device: Rolling walker (2 wheels) ?Gait Pattern/deviations: Step-through pattern, Decreased stride length, Shuffle ?Gait velocity: decreased ?  ?  ?General Gait Details: Slow, mildly unsteady gait with decreased foot clearance bilaterally. Pain in LLE with WB. Some lightheadedness reported and drop in BP. Fatigued. ? ?Stairs ?  ?  ?  ?  ?  ? ?Wheelchair Mobility ?  ? ?Modified Rankin (Stroke Patients Only) ?  ? ?  ? ?Balance Overall balance assessment: Needs assistance ?Sitting-balance support: No upper extremity supported, Feet supported ?Sitting balance-Leahy Scale: Good ?Sitting balance - Comments: Able to reach outside BoS and donn socks with increased time and some effort. ?  ?Standing balance support: During functional activity, Reliant on assistive device for balance, Bilateral upper extremity supported ?Standing balance-Leahy Scale: Poor ?Standing  balance comment: able to stand unsupported to pull pants around waist but sway noted and min guard needed ?  ?  ?   ?  ?  ?  ?  ?  ?  ?  ?  ?   ? ? ? ?Pertinent Vitals/Pain Pain Assessment ?Pain Assessment: Faces ?Faces Pain Scale: Hurts little more ?Pain Location: LLE ?Pain Descriptors / Indicators: Discomfort, Sore ?Pain Intervention(s): Monitored during session, Repositioned, Limited activity within patient's tolerance  ? ? ?Home Living Family/patient expects to be discharged to:: Shelter/Homeless ?Living Arrangements: Alone ?  ?  ?  ?  ?  ?  ?  ?  ?Additional Comments: Noted pt previously at [facility] 11/22 (unsure how long pt was here). After this, pt was staying at niece's home but unable to stay there long, ended up at friend's houses and most recently at a hotel  ?  ?Prior Function Prior Level of Function : Independent/Modified Independent ?  ?  ?  ?  ?  ?  ?Mobility Comments: use of RW for mobility, denies any recent falls ?ADLs Comments: Reports Mod I for ADLs (uses shower chair for showering tasks). Has assist with getting groceries, uses [employer] for HD transport though difficulty d/t no current stable address for pickup ?  ? ? ?Hand Dominance  ? Dominant Hand: Right ? ?  ?Extremity/Trunk Assessment  ? Upper Extremity Assessment ?Upper Extremity Assessment: Defer to OT evaluation ?  ? ?Lower Extremity Assessment ?Lower Extremity Assessment: LLE deficits/detail;Generalized weakness ?LLE Deficits / Details: Swelling present throughout with pain ?  ? ?Cervical / Trunk Assessment ?Cervical / Trunk Assessment: Normal  ?Communication  ? Communication: No difficulties;HOH  ?Cognition Arousal/Alertness: Lethargic ?Behavior During Therapy: Flat affect ?Overall Cognitive Status: No family/caregiver present to determine baseline cognitive functioning ?  ?  ?  ?  ?  ?  ?  ?  ?  ?  ?  ?  ?  ?  ?  ?  ?General Comments: follows directions consistently, slower motor planning and initiation. some difficulty reporting PLOF timeline with prompting questions though aware of situation, openly reports addiction and hopeful for  rehab to manage addiction. HOH needing repetition. ?  ?  ? ?  ?General Comments General comments (skin integrity, edema, etc.): BP pre activity 136/92, post activity BP 114/70. Reports HD "took off too much fluid." ? ?  ?Exercises    ? ?Assessment/Plan  ?  ?PT Assessment Patient needs continued PT services  ?PT Problem List Decreased strength;Decreased mobility;Pain;Decreased balance;Decreased activity tolerance;Cardiopulmonary status limiting activity;Decreased cognition ? ?   ?  ?PT Treatment Interventions Therapeutic exercise;Gait training;Patient/family education;Therapeutic activities;Functional mobility training;Balance training   ? ?PT Goals (Current goals can be found in the Care Plan section)  ?Acute Rehab PT Goals ?Patient Stated Goal: to go to rehab (physical and drug) ?PT Goal Formulation: With patient ?Time For Goal Achievement: 09/28/21 ?Potential to Achieve Goals: Fair ? ?  ?Frequency Min 3X/week ?  ? ? ?Co-evaluation   ?  ?  ?  ?  ? ? ?  ?AM-PAC PT "6 Clicks" Mobility  ?Outcome Measure Help needed turning from your back to your side while in a flat bed without using bedrails?: None ?Help needed moving from lying on your back to sitting on the side of a flat bed without using bedrails?: A Little ?Help needed moving to and from a bed to a chair (including a wheelchair)?: A Little ?Help needed standing up from a chair using your arms (  e.g., wheelchair or bedside chair)?: A Little ?Help needed to walk in hospital room?: A Little ?Help needed climbing 3-5 steps with a railing? : A Lot ?6 Click Score: 18 ? ?  ?End of Session Equipment Utilized During Treatment: Gait belt ?Activity Tolerance: Patient limited by fatigue ?Patient left: in bed;with call bell/phone within reach ?Nurse Communication: Mobility status ?PT Visit Diagnosis: Muscle weakness (generalized) (M62.81);Difficulty in walking, not elsewhere classified (R26.2) ?  ? ?Time: 4159-7331 ?PT Time Calculation (min) (ACUTE ONLY): 17 min ? ? ?Charges:    PT Evaluation ?$PT Eval Moderate Complexity: 1 Mod ?  ?  ?   ? ? ?Marisa Severin, PT, DPT ?Acute Rehabilitation Services ?Pager [phone] ?Office [phone] ? ? ? ? ?Jenera ?09/14/2021, 3:05 PM ?

## 2021-09-14 NOTE — Evaluation (Signed)
Occupational Therapy Evaluation Patient Details Name: Raymond [name] MRN: [id number] DOB: 04/25/1956 Today's Date: 09/14/2021   History of Present Illness 66 y.o. male presents to [facility] hospital 3/6 for worsening SOB, intermittent chest pain, bout of nausea and emesis, and some LE edema. Pt with 4 admissions since June 2022 for AMS and hypoglycemia, with PEA arrest during September admission. PMH includes ESRD on HDU TTS, COPD, hep C, HTN, CAD, PAD, COPD, crack cocaine dependence, A-fib, and medical noncompliance.   Clinical Impression   PTA, pt reports Modified Independent with ADLs, simple household IADLs and mobility using RW. Pt has assist for transportation and groceries at baseline. Pt presents now with deficits in strength, endurance, and dynamic standing balance. Pt requires Setup for UB ADLs, Min A for LB ADLs. Pt able to mobilize in room using RW with min guard after initial Min A to come to standing. With progressive mobility, pt with slower pace and difficulty clearing B feet placing pt at increased risk for falls. Recommend SNF rehab at DC based on current presentation. However, with continued HD and OOB activity, pt may progress beyond postacute rehab. Will continue to follow acutely and update DC recs as appropriate.      Recommendations for follow up therapy are one component of a multi-disciplinary discharge planning process, led by the attending physician.  Recommendations may be updated based on patient status, additional functional criteria and insurance authorization.   Follow Up Recommendations  Skilled nursing-short term rehab (<3 hours/day)    Assistance Recommended at Discharge Intermittent Supervision/Assistance  Patient can return home with the following A little help with walking and/or transfers;A little help with bathing/dressing/bathroom;Assistance with cooking/housework;Assist for transportation;Help with stairs or ramp for entrance    Functional Status Assessment   Patient has had a recent decline in their functional status and demonstrates the ability to make significant improvements in function in a reasonable and predictable amount of time.  Equipment Recommendations  None recommended by OT    Recommendations for Other Services       Precautions / Restrictions Precautions Precautions: Fall Restrictions Weight Bearing Restrictions: No      Mobility Bed Mobility Overal bed mobility: Modified Independent             General bed mobility comments: increased time    Transfers Overall transfer level: Needs assistance Equipment used: Rolling walker (2 wheels) Transfers: Sit to/from Stand Sit to Stand: Min assist           General transfer comment: increased effort needed to stand, anterior bias initially with Min A for safety/steadying      Balance Overall balance assessment: Needs assistance Sitting-balance support: No upper extremity supported, Feet supported Sitting balance-Leahy Scale: Fair     Standing balance support: Bilateral upper extremity supported, During functional activity Standing balance-Leahy Scale: Fair Standing balance comment: able to stand unsupported to pull pants around waist but sway noted and min guard needed                           ADL either performed or assessed with clinical judgement   ADL Overall ADL's : Needs assistance/impaired Eating/Feeding: Independent;Sitting Eating/Feeding Details (indicate cue type and reason): able to prepare coffee without assist Grooming: Set up;Sitting;Wash/dry face   Upper Body Bathing: Set up;Sitting   Lower Body Bathing: Minimal assistance;Sit to/from stand   Upper Body Dressing : Set up;Sitting   Lower Body Dressing: Minimal assistance;Sit to/from stand Lower Body Dressing  Details (indicate cue type and reason): to don tennis shoes, increased time to reach and get heel in shoe w/ some assist needed. assist to tie shoes Toilet Transfer: Min  guard;Ambulation;Rolling walker (2 wheels)   Toileting- Clothing Manipulation and Hygiene: Minimal assistance;Sit to/from stand       Functional mobility during ADLs: Min guard;Rolling walker (2 wheels) General ADL Comments: Slow pace with RW in room, with increased activity, noted increased difficulty clearing B feet placing pt at higher risk for falls. Reports feeling weaker, not fully at baseline     Vision Baseline Vision/History: 4 Cataracts Ability to See in Adequate Light: 2 Moderately impaired Patient Visual Report: No change from baseline Vision Assessment?: Vision impaired- to be further tested in functional context Additional Comments: R eye cloudy, reports visual impairment in B eyes. able to read large print calendar in room and buttons on remote     Perception     Praxis      Pertinent Vitals/Pain Pain Assessment Pain Assessment: No/denies pain     Hand Dominance Right   Extremity/Trunk Assessment Upper Extremity Assessment Upper Extremity Assessment: Generalized weakness   Lower Extremity Assessment Lower Extremity Assessment: Defer to PT evaluation   Cervical / Trunk Assessment Cervical / Trunk Assessment: Normal   Communication Communication Communication: No difficulties;HOH   Cognition Arousal/Alertness: Awake/alert Behavior During Therapy: Flat affect Overall Cognitive Status: No family/caregiver present to determine baseline cognitive functioning                                 General Comments: follows directions consistently, slower motor planning and initiation. some difficulty reporting PLOF timeline with prompting questions though aware of situation, openly reports addiction and hopeful for rehab to manage addiction     General Comments  VSS on RA    Exercises     Shoulder Instructions      Home Living Family/patient expects to be discharged to:: Shelter/Homeless Living Arrangements: Alone                                Additional Comments: Noted pt previously at [facility] 11/22 (unsure how long pt was here). After this, pt was staying at niece's home but unable to stay there long, ended up at friend's houses and most recently at a hotel      Prior Functioning/Environment Prior Level of Function : Independent/Modified Independent             Mobility Comments: use of RW for mobility, denies any recent falls ADLs Comments: Reports Mod I for ADLs (uses shower chair for showering tasks). Has assist with getting groceries, uses [employer] for HD transport though difficulty d/t no current stable address for pickup        OT Problem List: Decreased strength;Decreased activity tolerance;Impaired balance (sitting and/or standing)      OT Treatment/Interventions: Self-care/ADL training;Therapeutic exercise;Energy conservation;DME and/or AE instruction;Therapeutic activities;Patient/family education    OT Goals(Current goals can be found in the care plan section) Acute Rehab OT Goals Patient Stated Goal: get clean and stay clean OT Goal Formulation: With patient Time For Goal Achievement: 09/28/21 Potential to Achieve Goals: Good  OT Frequency: Min 2X/week    Co-evaluation              AM-PAC OT "6 Clicks" Daily Activity     Outcome Measure Help from another  person eating meals?: None Help from another person taking care of personal grooming?: A Little Help from another person toileting, which includes using toliet, bedpan, or urinal?: A Little Help from another person bathing (including washing, rinsing, drying)?: A Little Help from another person to put on and taking off regular upper body clothing?: A Little Help from another person to put on and taking off regular lower body clothing?: A Little 6 Click Score: 19   End of Session Equipment Utilized During Treatment: Gait belt;Rolling walker (2 wheels) Nurse Communication: Mobility status  Activity Tolerance: Patient  tolerated treatment well Patient left: in bed;with call bell/phone within reach  OT Visit Diagnosis: Unsteadiness on feet (R26.81);Other abnormalities of gait and mobility (R26.89);Muscle weakness (generalized) (M62.81)                Time: 2355-7322 OT Time Calculation (min): 19 min Charges:  OT General Charges $OT Visit: 1 Visit OT Evaluation $OT Eval Moderate Complexity: 1 Mod  Malachy Chamber, OTR/L Acute Rehab Services Office: [phone]   Layla Maw 09/14/2021, 7:48 AM

## 2021-09-14 NOTE — Progress Notes (Signed)
PROGRESS NOTE    Raymond [name]  JJK:[id number] DOB: [birth year] DOA: 09/13/2021 PCP: [name], FNP    Chief Complaint  Patient presents with   Chest Pain    Brief Narrative:  No notes on file    Assessment & Plan:  Principal Problem:   Uremia Active Problems:   Hypertensive urgency   ESRD (end stage renal disease) (HCC)   HYPERCHOLESTEROLEMIA   HYPERTENSION, BENIGN SYSTEMIC   Anemia associated with stage 5 chronic renal failure (HCC)   COPD (chronic obstructive pulmonary disease) (HCC)   ESRD on hemodialysis (HCC)   Dyspnea   PAD (peripheral artery disease) (HCC)   Non-compliance with renal dialysis (HCC)   PAF (paroxysmal atrial fibrillation) (HCC)   Chest pain   Polysubstance (excluding opioids) dependence (HCC)   Melena   Type 2 diabetes mellitus with other diabetic kidney complication (HCC)   Severe episode of recurrent major depressive disorder, without psychotic features ([place])    Assessment and Plan: * Uremia -Secondary to hemodialysis noncompliance. - Patient presented to the ED with shortness of breath, stating he feels like he needs urgent hemodialysis as he is missed about 2-3 hemodialysis sessions.  Patient also with some complaints of intermittent chest pain. -Patient noted to have some asterixis on examination as well as some mild tremors on presentation. -Patient had some nausea and emesis 1 day prior to admission. -Patient seen in consultation by nephrology and patient underwent hemodialysis last night as well as this morning.   -Patient with some complaints of extremity cramping.   -Improving clinically overall.   -Per nephrology.    Hypertensive urgency - Secondary to medication noncompliance. -Per ED physician patient received a dose of labetalol in the ED. -Blood pressure better controlled with resumption of home regimen of Norvasc, Coreg and hemodialysis.   -IV hydralazine as needed.    ESRD (end stage renal disease) ([place]) - Patient  noted to have missed about 2-3 hemodialysis sessions per patient secondary to his crack cocaine dependence. -Patient seen in consultation by nephrology and patient underwent hemodialysis last night and this morning.   -Continue calcitriol, Sensipar, aranesp, Rena-Vite, Renvela. -Per nephrology.   Severe episode of recurrent major depressive disorder, without psychotic features ([place]) - Patient with no suicidal or homicidal ideation. -Not on any medications prior to admission per med rec. -Outpatient follow-up. -Follow.  Type 2 diabetes mellitus with other diabetic kidney complication ([place]) - Patient denies any history of diabetes mellitus however noted on the records. -Hemoglobin A1c 4.6.   -Doubt if patient is currently diabetic.  Melena - Patient stated had some melanotic stools this weekend. -Hemoglobin stable at 7.4.  -FOBT pending.  -Check FOBT. -Anemia panel consistent with anemia of chronic disease  -Continue PPI twice daily.   -Transfusion threshold hemoglobin < 7.   Polysubstance (excluding opioids) dependence ([place]) - Patient with crack cocaine dependence. -Patient motivated for cessation and asking for help for his crack cocaine dependence. -Continue clonidine detox protocol. -TOC consulted.   -Hopefully once medically stable can be discharged directly to rehab facility.   Chest pain - Likely secondary to dyspnea/shortness of breath/volume overload secondary to missed hemodialysis sessions and hypertensive urgency. -Currently chest pain-free. -Cardiac enzymes mildly elevated but flattened and lower than prior troponins. -EKG with normal sinus rhythm with LVH with no ischemic changes noted. -Patient underwent HD last night and this morning. -Follow.  PAF (paroxysmal atrial fibrillation) ([place]) - Patient no history of paroxysmal atrial fibrillation on anticoagulation with Eliquis. -Patient noncompliant  with medications. -Currently normal sinus rhythm in the  ED. -Continue home regimen Coreg for rate control as well as amiodarone. -Patient did have some complaints of melanotic stools, noted to be anemic, and as such Eliquis on hold pending FOBT.   -If hemoglobin stable, FOBT negative will resume Eliquis.    Non-compliance with renal dialysis [facility]) - Patient for HD x2 since admission.. -Importance of compliance with hemodialysis stressed to patient. -Per nephrology.  PAD (peripheral artery disease) (HCC) - Continue statin. -On Eliquis prior to admission however due to concerns for melanotic stool Eliquis currently on hold.    Dyspnea - Likely secondary to volume overload from missed hemodialysis sessions and hypertensive urgency. -Patient currently 97% on room air. -Patient seen by nephrology and patient underwent hemodialysis yesterday and this morning.   -For hemodialysis tonight or tomorrow. -Resume home antihypertensive medications. -Continue Coreg, Norvasc,  ESRD on hemodialysis [facility]) - Noted on hemodialysis Tuesday Thursday Saturday on [employer]. -Noncompliance with hemodialysis. -Noted to have missed about 3 sessions of HD. -Nephrology following patient and patient underwent hemodialysis yesterday and today.    COPD (chronic obstructive pulmonary disease) (HCC) - Currently stable. -Continue  Incruse, Dulera, Xopenex and Atrovent nebs as needed.  Anemia associated with stage 5 chronic renal failure ([place]) - Patient with a hemoglobin of 7.6 (09/13/2021) with last hemoglobin noted on epic at 9.0 on 05/28/2021 -Patient does endorse some melanotic stools. -Anemia panel consistent with anemia of chronic disease. -FOBT pending -Continue PPI twice daily. -Follow H&H. -Transfusion threshold hemoglobin < 7.  HYPERTENSION, BENIGN SYSTEMIC - See hypertensive urgency above.  HYPERCHOLESTEROLEMIA - Continue statin.         DVT prophylaxis: SCDs Code Status: Full Family Communication: Updated patient.  No family at  bedside. Disposition: Home when clinically improved versus directly to rehab.  Status is: Inpatient Remains inpatient appropriate because: Severity of illness   Consultants:  Nephrology: Dr.Schertz 09/13/2021  Procedures:  Chest x-ray 09/13/2021   Antimicrobials:  None   Subjective: Patient just returned from hemodialysis.  Complaining of leg cramps.  No chest pain.  No shortness of breath.  No abdominal pain.  Objective: Vitals:   09/14/21 0930 09/14/21 1000 09/14/21 1040 09/14/21 1122  BP: 119/82 (!) 98/47 110/74 (!) 135/92  Pulse: 77 78 75 80  Resp: '19 14 15 18  '$ Temp:   97.9 F (36.6 C) 98 F (36.7 C)  TempSrc:   Temporal Oral  SpO2: 100% 100% 98% 99%  Weight:   64.5 kg   Height:        Intake/Output Summary (Last 24 hours) at 09/14/2021 1528 Last data filed at 09/14/2021 1040 Gross per 24 hour  Intake --  Output 5500 ml  Net -5500 ml   Filed Weights   09/14/21 0500 09/14/21 0805 09/14/21 1040  Weight: 68.5 kg 67 kg 64.5 kg    Examination:  General exam: Appears calm and comfortable  Respiratory system: Clear to auscultation. Respiratory effort normal. Cardiovascular system: S1 & S2 heard, RRR. No JVD, murmurs, rubs, gallops or clicks. No pedal edema. Gastrointestinal system: Abdomen is nondistended, soft and nontender. No organomegaly or masses felt. Normal bowel sounds heard. Central nervous system: Alert and oriented. No focal neurological deficits. Extremities: Symmetric 5 x 5 power. Skin: No rashes, lesions or ulcers Psychiatry: Judgement and insight appear normal. Mood & affect appropriate.     Data Reviewed:   CBC: Recent Labs  Lab 09/13/21 1030 09/14/21 0159  WBC 3.1* 2.7*  HGB 7.6* 7.4*  HCT 23.8* 23.5*  MCV 101.3* 100.0  PLT 173 147*    Basic Metabolic Panel: Recent Labs  Lab 09/13/21 1030 09/14/21 0159  NA 140 135  K 4.9 3.7  CL 92* 98  CO2 27 28  GLUCOSE 111* 110*  BUN 102* 31*  CREATININE 19.45* 8.80*  CALCIUM 10.1 9.4  MG   --  2.2  PHOS  --  5.3*    GFR: Estimated Creatinine Clearance: 7.4 mL/min (A) (by C-G formula based on SCr of 8.8 mg/dL (H)).  Liver Function Tests: Recent Labs  Lab 09/14/21 0159  ALBUMIN 2.9*    CBG: Recent Labs  Lab 09/14/21 0609  GLUCAP 89     Recent Results (from the past 240 hour(s))  Resp Panel by RT-PCR (Flu A&B, Covid) Nasopharyngeal Swab     Status: None   Collection Time: 09/13/21 10:21 AM   Specimen: Nasopharyngeal Swab; Nasopharyngeal(NP) swabs in vial transport medium  Result Value Ref Range Status   SARS Coronavirus 2 by RT PCR NEGATIVE NEGATIVE Final    Comment: (NOTE) SARS-CoV-2 target nucleic acids are NOT DETECTED.  The SARS-CoV-2 RNA is generally detectable in upper respiratory specimens during the acute phase of infection. The lowest concentration of SARS-CoV-2 viral copies this assay can detect is 138 copies/mL. A negative result does not preclude SARS-Cov-2 infection and should not be used as the sole basis for treatment or other patient management decisions. A negative result may occur with  improper specimen collection/handling, submission of specimen other than nasopharyngeal swab, presence of viral mutation(s) within the areas targeted by this assay, and inadequate number of viral copies(<138 copies/mL). A negative result must be combined with clinical observations, patient history, and epidemiological information. The expected result is Negative.  Fact Sheet for Patients:  EntrepreneurPulse.com.au  Fact Sheet for Healthcare Providers:  IncredibleEmployment.be  This test is no t yet approved or cleared by the [place] FDA and  has been authorized for detection and/or diagnosis of SARS-CoV-2 by FDA under an Emergency Use Authorization (EUA). This EUA will remain  in effect (meaning this test can be used) for the duration of the COVID-19 declaration under Section 564(b)(1) of the Act,  21 U.S.C.section 360bbb-3(b)(1), unless the authorization is terminated  or revoked sooner.       Influenza A by PCR NEGATIVE NEGATIVE Final   Influenza B by PCR NEGATIVE NEGATIVE Final    Comment: (NOTE) The Xpert Xpress SARS-CoV-2/FLU/RSV plus assay is intended as an aid in the diagnosis of influenza from Nasopharyngeal swab specimens and should not be used as a sole basis for treatment. Nasal washings and aspirates are unacceptable for Xpert Xpress SARS-CoV-2/FLU/RSV testing.  Fact Sheet for Patients: EntrepreneurPulse.com.au  Fact Sheet for Healthcare Providers: IncredibleEmployment.be  This test is not yet approved or cleared by the [place] FDA and has been authorized for detection and/or diagnosis of SARS-CoV-2 by FDA under an Emergency Use Authorization (EUA). This EUA will remain in effect (meaning this test can be used) for the duration of the COVID-19 declaration under Section 564(b)(1) of the Act, 21 U.S.C. section 360bbb-3(b)(1), unless the authorization is terminated or revoked.  Performed at [facility], [place] [address], [place], [place] [address]          Radiology Studies: DG Chest 2 View  Result Date: 09/13/2021 CLINICAL DATA:  Chest pain EXAM: CHEST - 2 VIEW COMPARISON:  Radiograph 05/18/2021 FINDINGS: Unchanged enlarged cardiac silhouette. Shoulder pulmonary vascular congestion. No large pleural effusion. No visible pneumothorax.  No acute osseous abnormality. Left upper extremity stent noted. IMPRESSION: Cardiomegaly with central pulmonary vascular congestion. Electronically Signed   By: Maurine Simmering M.D.   On: 09/13/2021 10:50        Scheduled Meds:  amiodarone  200 mg Oral Daily   amLODipine  5 mg Oral QHS   calcitRIOL  2 mcg Oral Q T,Th,Sa-HD   carvedilol  12.5 mg Oral BID   Chlorhexidine Gluconate Cloth  6 each Topical Q0600   cinacalcet  180 mg Oral Q T,Th,Sa-HD   Darbepoetin Alfa  200 mcg  Intravenous Q Tue-HD   folic acid  1 mg Oral Daily   mometasone-formoterol  2 puff Inhalation BID   multivitamin  1 tablet Oral QHS   nicotine  14 mg Transdermal Daily   pantoprazole  40 mg Oral BID AC   rosuvastatin  10 mg Oral Daily   sevelamer carbonate  4,000 mg Oral TID WC   sodium chloride flush  3 mL Intravenous Q12H   sodium chloride flush  3 mL Intravenous Q12H   thiamine  100 mg Oral Daily   umeclidinium bromide  1 puff Inhalation Daily   Continuous Infusions:  sodium chloride       LOS: 1 day    Time spent: 40 minutes    Irine Seal, MD [facility]   To contact the attending provider between 7A-7P or the covering provider during after hours 7P-7A, please log into the web site [email or web address] and access using universal [place] password for that web site. If you do not have the password, please call the hospital operator.  09/14/2021, 3:28 PM

## 2021-09-15 DIAGNOSIS — R0609 Other forms of dyspnea: Secondary | ICD-10-CM

## 2021-09-15 DIAGNOSIS — D696 Thrombocytopenia, unspecified: Secondary | ICD-10-CM

## 2021-09-15 DIAGNOSIS — F332 Major depressive disorder, recurrent severe without psychotic features: Secondary | ICD-10-CM

## 2021-09-15 DIAGNOSIS — Z9115 Patient's noncompliance with renal dialysis: Secondary | ICD-10-CM

## 2021-09-15 LAB — CBC
HCT: 27.8 % — ABNORMAL LOW (ref 39.0–52.0)
Hemoglobin: 8.7 g/dL — ABNORMAL LOW (ref 13.0–17.0)
MCH: 32.2 pg (ref 26.0–34.0)
MCHC: 31.3 g/dL (ref 30.0–36.0)
MCV: 103 fL — ABNORMAL HIGH (ref 80.0–100.0)
Platelets: 132 10*3/uL — ABNORMAL LOW (ref 150–400)
RBC: 2.7 MIL/uL — ABNORMAL LOW (ref 4.22–5.81)
RDW: 20.1 % — ABNORMAL HIGH (ref 11.5–15.5)
WBC: 2.7 10*3/uL — ABNORMAL LOW (ref 4.0–10.5)
nRBC: 0 % (ref 0.0–0.2)

## 2021-09-15 LAB — RENAL FUNCTION PANEL
Albumin: 3 g/dL — ABNORMAL LOW (ref 3.5–5.0)
Anion gap: 8 (ref 5–15)
BUN: 18 mg/dL (ref 8–23)
CO2: 29 mmol/L (ref 22–32)
Calcium: 9.3 mg/dL (ref 8.9–10.3)
Chloride: 95 mmol/L — ABNORMAL LOW (ref 98–111)
Creatinine, Ser: 7.61 mg/dL — ABNORMAL HIGH (ref 0.61–1.24)
GFR, Estimated: 7 mL/min — ABNORMAL LOW (ref >60)
Glucose, Bld: 99 mg/dL (ref 70–99)
Phosphorus: 4.3 mg/dL (ref 2.5–4.6)
Potassium: 4.1 mmol/L (ref 3.5–5.1)
Sodium: 132 mmol/L — ABNORMAL LOW (ref 135–145)

## 2021-09-15 LAB — OCCULT BLOOD X 1 CARD TO LAB, STOOL: Fecal Occult Bld: NEGATIVE

## 2021-09-15 LAB — MAGNESIUM: Magnesium: 2.1 mg/dL (ref 1.7–2.4)

## 2021-09-15 LAB — HEPATITIS B SURFACE ANTIBODY, QUANTITATIVE: Hep B S AB Quant (Post): >1000 m[IU]/mL (ref >9.9)

## 2021-09-15 NOTE — Assessment & Plan Note (Addendum)
-  Patient's Platelet Count went from 173 -> 147 -> 132 -> 137 -> 124 ?-Continue to Monitor and Trend ?-Repeat CBC in the AM  ?

## 2021-09-15 NOTE — Progress Notes (Signed)
PROGRESS NOTE    DRAPER GALLON  IZT:[id number] DOB: 1956/05/04 DOA: 09/13/2021 PCP: [name], FNP   Brief Narrative:  The patient is a 66 year old African-American male with past medical history significant for but limited to end-stage renal disease on hemodialysis Tuesdays Thursdays and Saturdays with history of noncompliance to hemodialysis, history of crack cocaine dependence, atrial fibrillation and history of medication noncompliance, history of COPD, history of anemia chronic kidney disease, hypertension as well as other comorbidities who presented to the ED with worsening shortness of breath, intermittent chest pain, burping and a bout of nausea and emesis the day prior to admission.  He did have some intermittent chest pain but was chest pain-free at the time of admission and noted some lower extremity edema.  He states that due to his crack cocaine dependence he missed 3 sessions of hemodialysis and he felt that he needed urgent hemodialysis.  He did endorse some melanotic stools over the weekend but denies any iron use.  He had no fevers or chills or abdominal pain or diarrhea.  He did endorse some feelings of being hot and sweaty.  Upon arrival in the ED he had a blood pressure 207/102 with a respiratory rate of 23.  Chest x-ray was obtained and showed cardiomegaly with central pulmonary vascular congestion and EKG showed normal sinus rhythm.  In the ED he is stabilized and given IV labetalol for his hypertension urgency.  Nephrology was consulted for further evaluation for his uremia secondary to his hemodialysis noncompliance.  He was noted to have some asterixis on examination as well as some mild tremors and did have some nausea and emesis the day prior.  Patient was taken for hemodialysis on 09/13/2021 with improvement in his symptoms.  His uremia resolved with hemodialysis and PT OT evaluated and recommending SNF given his debility.  Plan is to get the patient for hemodialysis today get  back on schedule and recommending keeping him even with his next hemodialysis session.    Assessment and Plan: * Uremia -Secondary to hemodialysis noncompliance. -Patient presented to the ED with shortness of breath, stating he feels like he needs urgent hemodialysis as he is missed about 2-3 hemodialysis sessions.  Patient also with some complaints of intermittent chest pain. -Patient noted to have some asterixis on examination as well as some mild tremors on presentation which have improved  -Patient had some nausea and emesis 1 day prior to admission. -Patient seen in consultation by nephrology and patient underwent hemodialysis last night as well as this morning.   -Patient with some complaints of extremity cramping.   -Improving clinically overall.   -Per nephrology he will be dialyzed today to get back on his normal schedule -Patient's BUNs/creatinine went from 102/19.45 and had trended down to 18/7.61 today given that his uremic symptoms are improved -Further care per nephrology  Hypertensive urgency - Secondary to medication noncompliance. -Per ED physician patient received a dose of labetalol in the ED. -Blood pressure better controlled with resumption of home regimen of Norvasc, Coreg and hemodialysis.   -Continue to monitor blood pressures per protocol; last blood pressure reading was 100/65 -IV hydralazine as needed.    ESRD (end stage renal disease) ([place]) -Patient noted to have missed about 2-3 hemodialysis sessions per patient secondary to his crack cocaine dependence. -Patient seen in consultation by nephrology and patient underwent hemodialysis last night and this morning.   -Continue calcitriol, Sensipar, aranesp, Rena-Vite, Renvela. -Further Care Per nephrology.   Thrombocytopenia ([place]) -Patient's  Platelet Count went from 173 -> 147 -> 132 -Continue to Monitor and Trend -Repeat CBC in the AM   Severe episode of recurrent major depressive disorder, without psychotic  features ([place]) - Patient with no suicidal or homicidal ideation. -Not on any medications prior to admission per med rec. -Outpatient follow-up with Psychiatry -Continue to Follow   Type 2 diabetes mellitus with other diabetic kidney complication ([place]) - Patient denies any history of diabetes mellitus however noted on the records. -Hemoglobin A1c 4.6.   -Doubt if patient is currently diabetic. -Blood sugars have been ranging from 90-111 on daily BMP  Melena - Patient stated had some melanotic stools this weekend. -Hemoglobin stable at 7.4 yesterday and today her hemoglobin/hematocrit is 8.7/27.8 with an MCV of 103.0 -FOBT pending and Check FOBT. -Anemia panel consistent with anemia of chronic disease as it showed an iron level of 60, U IBC 224, TIBC 284, saturation ratios of 21%, ferritin level 148, folate level 15.4, vitamin B12 level 633 -Continue PPI twice daily.   -Transfusion threshold hemoglobin < 7. -May discuss with Gastroenterology for further evaluation recommendations and he underwent an enteroscopy back in July of last year [facility] but will wait for his FOBT to come back first   Polysubstance (excluding opioids) dependence ([place]) -Patient with crack cocaine dependence. -Patient motivated for cessation and asking for help for his crack cocaine dependence. -Initiated clonidine detox protocol but has now completed  -TOC consulted.   -Hopefully once medically stable can be discharged directly to rehab facility.   Chest pain -Likely secondary to dyspnea/shortness of breath/volume overload secondary to missed hemodialysis sessions and hypertensive urgency. -Currently chest pain-free. -Cardiac enzymes mildly elevated but flattened and lower than prior troponins. -EKG with normal sinus rhythm with LVH with no ischemic changes noted. -Patient underwent HD the day before yesterday and yesterday -His chest pain is resolved and will need to continue monitor carefully  PAF  (paroxysmal atrial fibrillation) ([place]) -Patient no history of paroxysmal atrial fibrillation on anticoagulation with Eliquis. -Patient noncompliant with medications. -Currently normal sinus rhythm in the ED. -Continue home regimen Carvedilol 12.5 mg po BID for rate control as well as Amiodarone 200 mg po Daily  -Patient did have some complaints of melanotic stools, noted to be anemic, and as such Eliquis on hold pending FOBT.   -FOBT is still pending and hemoglobin/hematocrit is relatively stable and went from 7.4/23.5 is now 8.7/27.8 with an MCV of 103.0 -If hemoglobin stable, FOBT negative will resume Eliquis.    Non-compliance with renal dialysis [facility])  Patient for HD x2 since admission.. -Importance of compliance with hemodialysis stressed to patient. -Per nephrology. -His BUNs/creatinine has drastically improved and went from 102/19.45 is now 18/7.61 -Hold further nephrotoxic medications, contrast dyes, hypotension and renally adjust medications  PAD (peripheral artery disease) ([facility]) -Continue statin. -On Eliquis prior to admission however due to concerns for melanotic stool Eliquis currently on hold.   -May discussed with Gastroenterology for further evaluation and recommendations  Dyspnea - Likely secondary to volume overload from missed hemodialysis sessions and hypertensive urgency. -Patient currently 97% on room air. -SpO2: 95 % O2 Flow Rate (L/min): 2 L/min -Patient seen by nephrology and patient underwent hemodialysis yesterday and this morning.   -Next scheduled dialysis session is on 09/16/2021 -Resume home antihypertensive medications. -Continue Coreg, Norvasc,  ESRD on hemodialysis [facility]) - Noted on hemodialysis Tuesday Thursday Saturday on [employer]. -Noncompliance with hemodialysis. -Noted to have missed about 3 sessions of HD. -Nephrology following patient and  patient underwent hemodialysis the day before yesterday and yesterday.  Nephrology plans to get the  patient back on regular hemodialysis schedule on Tuesday Thursday Saturdays  COPD (chronic obstructive pulmonary disease) ([facility]) -Currently stable. -Continue  Incruse, Dulera, Xopenex and Atrovent nebs as needed. 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 -Anemia panel consistent with anemia of chronic disease as above -Repeat Hgb/Hct improved to 8.7/27.8 -FOBT pending -Continue PPI twice daily. -Follow H&H. -Transfusion threshold hemoglobin < 7.  HYPERTENSION, BENIGN SYSTEMIC -See hypertensive urgency above.  HYPERCHOLESTEROLEMIA -Continue Rosuvastatin 10 mg po Daily .  DVT prophylaxis: SCDs Start: 09/13/21 2214 Place and maintain sequential compression device Start: 09/13/21 1722    Code Status: Full Code Family Communication: No family currently at bedside  Disposition Plan:  Level of care: Progressive Status is: Inpatient Remains inpatient appropriate because: Still does not have a safe discharge disposition as PT OT recommending SNF and he still has no bed offers yet   Consultants:  Nephrology  Procedures:  Hemodialysis x2  Antimicrobials:  Anti-infectives (From admission, onward)    None        Subjective: Seen and examined at bedside he is resting and wanting to sleep.  Denies any nausea or vomiting.  Thinks his chest pain and shortness of breath is better.  Feels okay. Will be going for dialysis tomorrow.  No other concerns or complaints at this time  Objective: Vitals:   09/15/21 0506 09/15/21 0756 09/15/21 1207 09/15/21 1627  BP: 125/66 124/80 100/65 140/82  Pulse: 69 72 65 71  Resp: '20 15 17 14   '$ Temp: 98 F (36.7 C) 98 F (36.7 C) 97.6 F (36.4 C) 98.1 F (36.7 C)  TempSrc: Oral Oral Oral Oral  SpO2: 98% 94% 95% 100%  Weight: 64.5 kg     Height:        Intake/Output Summary (Last 24 hours) at 09/15/2021 1631 Last data filed at 09/15/2021 1626 Gross per 24 hour  Intake 480 ml  Output --  Net 480 ml   Filed Weights   09/14/21 0805 09/14/21 1040 09/15/21 0506  Weight: 67 kg 64.5 kg 64.5 kg   Examination: Physical Exam:  Constitutional: WN/WD, NAD and appears calm and comfortable Respiratory: Diminished to auscultation bilaterally with coarse breath sounds, no wheezing, rales, rhonchi or crackles. Normal respiratory effort and patient is not tachypenic. No accessory muscle use.  Cardiovascular: RRR, no murmurs / rubs / gallops. S1 and S2 auscultated.  Abdomen: Soft, non-tender, non-distended.  Bowel sounds positive.  GU: Deferred. Musculoskeletal: No clubbing / cyanosis of digits/nails. No joint deformity upper and lower extremities.  Skin: No rashes, lesions, ulcers on a limited skin evaluation. No induration; Warm and dry.  Neurologic: CN 2-12 grossly intact with no focal deficits.    Data Reviewed: I have personally reviewed following labs and imaging studies  CBC: Recent Labs  Lab 09/13/21 1030 09/14/21 0159 09/15/21 0151  WBC 3.1* 2.7* 2.7*  HGB 7.6* 7.4* 8.7*  HCT 23.8* 23.5* 27.8*  MCV 101.3* 100.0 103.0*  PLT 173 147* 017*   Basic Metabolic Panel: Recent Labs  Lab 09/13/21 1030 09/14/21 0159 09/15/21 0151  NA 140 135  132*  K 4.9 3.7 4.1  CL 92* 98 95*  CO2 '27 28 29  '$ GLUCOSE 111* 110* 99  BUN 102* 31* 18  CREATININE 19.45* 8.80* 7.61*  CALCIUM 10.1 9.4 9.3  MG  --  2.2 2.1  PHOS  --  5.3* 4.3   GFR: Estimated Creatinine Clearance: 8.6 mL/min (A) (by C-G formula based on SCr of 7.61 mg/dL (H)). Liver Function Tests: Recent Labs  Lab 09/14/21 0159 09/15/21 0151  ALBUMIN 2.9* 3.0*   No results for input(s): LIPASE, AMYLASE in the last  168 hours. No results for input(s): AMMONIA in the last 168 hours. Coagulation Profile: No results for input(s): INR, PROTIME in the last 168 hours. Cardiac Enzymes: No results for input(s): CKTOTAL, CKMB, CKMBINDEX, TROPONINI in the last 168 hours. BNP (last 3 results) No results for input(s): PROBNP in the last 8760 hours. HbA1C: Recent Labs    09/14/21 0159  HGBA1C 4.6*   CBG: Recent Labs  Lab 09/14/21 0609  GLUCAP 89   Lipid Profile: No results for input(s): CHOL, HDL, LDLCALC, TRIG, CHOLHDL, LDLDIRECT in the last 72 hours. Thyroid Function Tests: No results for input(s): TSH, T4TOTAL, FREET4, T3FREE, THYROIDAB in the last 72 hours. Anemia Panel: Recent Labs    09/14/21 0159  VITAMINB12 633  FOLATE 15.4  FERRITIN 148  TIBC 284  IRON 60   Sepsis Labs: No results for input(s): PROCALCITON, LATICACIDVEN in the last 168 hours.  Recent Results (from the past 240 hour(s))  Resp Panel by RT-PCR (Flu A&B, Covid) Nasopharyngeal Swab     Status: None   Collection Time: 09/13/21 10:21 AM   Specimen: Nasopharyngeal Swab; Nasopharyngeal(NP) swabs in vial transport medium  Result Value Ref Range Status   SARS Coronavirus 2 by RT PCR NEGATIVE NEGATIVE Final    Comment: (NOTE) SARS-CoV-2 target nucleic acids are NOT DETECTED.  The SARS-CoV-2 RNA is generally detectable in upper respiratory specimens during the acute phase of infection. The lowest concentration of SARS-CoV-2 viral copies this assay can detect is 138 copies/mL. A negative result does not preclude SARS-Cov-2 infection and should not be used as the sole basis for treatment or other patient management decisions. A negative result may occur with  improper specimen collection/handling, submission of specimen other than nasopharyngeal swab, presence of viral mutation(s) within the areas targeted by this assay, and inadequate number of viral copies(<138 copies/mL). A negative result must be combined with clinical  observations, patient history, and epidemiological information. The expected result is Negative.  Fact Sheet for Patients:  EntrepreneurPulse.com.au  Fact Sheet for Healthcare Providers:  IncredibleEmployment.be  This test is no t yet approved or cleared by the [place] FDA and  has been authorized for detection and/or diagnosis of SARS-CoV-2 by FDA under an Emergency Use Authorization (EUA). This EUA will remain  in effect (meaning this test can be used) for the duration of the COVID-19 declaration under Section 564(b)(1) of the Act, 21 U.S.C.section 360bbb-3(b)(1), unless the authorization is terminated  or revoked sooner.       Influenza A by PCR NEGATIVE NEGATIVE Final   Influenza B by PCR NEGATIVE NEGATIVE Final    Comment: (NOTE) The Xpert Xpress SARS-CoV-2/FLU/RSV plus assay is intended as an aid in the diagnosis of influenza from Nasopharyngeal swab specimens and should not be used as a sole basis for treatment. Nasal washings and aspirates are unacceptable for Xpert Xpress SARS-CoV-2/FLU/RSV testing.  Fact Sheet for Patients: EntrepreneurPulse.com.au  Fact Sheet for Healthcare Providers: IncredibleEmployment.be  This  test is not yet approved or cleared by the [place] and has been authorized for detection and/or diagnosis of SARS-CoV-2 by FDA under an Emergency Use Authorization (EUA). This EUA will remain in effect (meaning this test can be used) for the duration of the COVID-19 declaration under Section 564(b)(1) of the Act, 21 U.S.C. section 360bbb-3(b)(1), unless the authorization is terminated or revoked.  Performed at [facility], [place] [address], [place], [place] [address]     Radiology Studies: No results found.   Scheduled Meds:  amiodarone  200 mg Oral Daily   amLODipine  5 mg Oral QHS   calcitRIOL  2 mcg Oral Q T,Th,Sa-HD   carvedilol  12.5 mg Oral BID    Chlorhexidine Gluconate Cloth  6 each Topical Q0600   cinacalcet  180 mg Oral Q T,Th,Sa-HD   [START ON 09/21/2021] darbepoetin (ARANESP) injection - NON-DIALYSIS  200 mcg Subcutaneous Q [id number]   folic acid  1 mg Oral Daily   mometasone-formoterol  2 puff Inhalation BID   multivitamin  1 tablet Oral QHS   nicotine  14 mg Transdermal Daily   pantoprazole  40 mg Oral BID AC   rosuvastatin  10 mg Oral Daily   sevelamer carbonate  4,000 mg Oral TID WC   sodium chloride flush  3 mL Intravenous Q12H   sodium chloride flush  3 mL Intravenous Q12H   thiamine  100 mg Oral Daily   umeclidinium bromide  1 puff Inhalation Daily   Continuous Infusions:  sodium chloride      LOS: 2 days   Raiford Noble, DO [facility] Available via Epic secure chat 7am-7pm After these hours, please refer to coverage provider listed on [email or web address] 09/15/2021, 4:31 PM

## 2021-09-15 NOTE — Progress Notes (Addendum)
[facility] ?Progress Note ? ?Subjective: no c/o  ? ?Vitals:  ? 09/14/21 2028 09/14/21 2339 09/15/21 0506 09/15/21 0756  ?BP: (!) 145/88 125/76 125/66 124/80  ?Pulse: 70 70 69 72  ?Resp: '19 17 20 15  '$ ?Temp: 98.2 ?F (36.8 ?C)  98 ?F (36.7 ?C) 98 ?F (36.7 ?C)  ?TempSrc: Oral  Oral Oral  ?SpO2: 95% 96% 98% 94%  ?Weight:   64.5 kg   ?Height:      ? ? ?Exam: ? alert, nad  ? no jvd ? Chest cta bilat ? Cor reg no RG ? Abd soft ntnd no ascites ?  Ext no LE edema ?  Alert, NF, ox3 ?  LUA aVF+bruit ? ? ? Home meds include - amiodarone, norvasc, coreg 12.5 bid, crestor, renvela 4 ac tid, rena-vit, nicotine patch, prns/ vits/ supps ?  ?  ?  ? OP HD: [facility]TS ?  4h  400/1.5  67kg  2/2 bath  L AVF  Hep none ? - covid neg 3/06, hep B SAg neg 3/07 ?  - rocaltrol 2 ug tiw po ? - mircera 200 mcq g q2 wks, last esa 150 mcg on 2/14 ? - home; renvela 4 ac tid ?  ? ?   CXR - IMPRESSION: Cardiomegaly with central pulmonary vascular congestion. ?  ?Assessment/ Plan: ?Uremia - missed HD x 3. As below, resolved w/ HD.  ?Debility - for SNF placement per PT.  ?Major depression, recurrent - per pmd ?Cocaine - hx of abuse  ?ESRD - on HD TTS.  Missed HD x 3 w/ sig azotemia. Had HD 3/06 > creat down today. Pt looks better. Plan HD today to get back on schedule.   ?HTN /vol - cont home meds. Under dry wt, keep even next HD.  ? 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 Ca and phos in range.  ?Anemia ckd - Hb low at 7.6.  Due for ESA, getting darbe 200 ug weekly while here, 1st dose 3/07. ?  ? ? ? ? ?Rob Doctor, hospital ?09/15/2021, 12:02 PM ? ? ?Recent Labs  ?Lab 09/14/21 ?0159 09/15/21 ?0151  ?K 3.7 4.1  ?BUN 31* 18  ?CREATININE 8.80* 7.61*  ?ALBUMIN 2.9* 3.0*  ?CALCIUM 9.4 9.3  ?PHOS 5.3* 4.3  ?HGB 7.4* 8.7*  ? ? ?Inpatient medications: ? amiodarone  200 mg Oral Daily  ? amLODipine  5 mg Oral QHS  ? calcitRIOL  2 mcg Oral Q T,Th,Sa-HD  ? carvedilol  12.5 mg Oral BID  ? Chlorhexidine Gluconate  Cloth  6 each Topical Q0600  ? cinacalcet  180 mg Oral Q T,Th,Sa-HD  ? [START ON 09/21/2021] darbepoetin (ARANESP) injection - NON-DIALYSIS  200 mcg Subcutaneous Q Tue-1800  ? folic acid  1 mg Oral Daily  ? mometasone-formoterol  2 puff Inhalation BID  ? multivitamin  1 tablet Oral QHS  ? nicotine  14 mg Transdermal Daily  ? pantoprazole  40 mg Oral BID AC  ? rosuvastatin  10 mg Oral Daily  ? sevelamer carbonate  4,000 mg Oral TID WC  ? sodium chloride flush  3 mL Intravenous Q12H  ? sodium chloride flush  3 mL Intravenous Q12H  ? thiamine  100 mg Oral Daily  ? umeclidinium bromide  1 puff Inhalation Daily  ? ? sodium chloride    ? ?sodium chloride, acetaminophen **OR** acetaminophen, dicyclomine, hydrALAZINE, hydrOXYzine, ipratropium, levalbuterol, loperamide, methocarbamol, ondansetron **OR** ondansetron (ZOFRAN)  IV, oxyCODONE, polyethylene glycol, sodium chloride flush, sorbitol ? ? ? ? ? ? ?

## 2021-09-15 NOTE — Progress Notes (Signed)
Occupational Therapy Treatment ?Patient Details ?Name: Raymond [name] ?MRN: [id number] ?DOB: [birth year] ?Today's Date: 09/15/2021 ? ? ?History of present illness 66 y.o. male presents to [facility] hospital 3/6 for worsening SOB, intermittent chest pain, bout of nausea and emesis, and some LE edema. Pt with 4 admissions since June 2022 for AMS and hypoglycemia, with PEA arrest during September admission. PMH includes ESRD on HDU TTS, COPD, hep C, HTN, CAD, PAD, COPD, crack cocaine dependence, A-fib, and medical noncompliance. ?  ?OT comments ? Patient received in supine and agreeable to OT session. Patient stated he did not want to perform self care task until he has a shower and there are currently no orders for shower. Patient performed toilet transfer and item retrieval with RW and min guard for safety.  Patient tolerated 2 minutes of standing before requiring rest break and was instructed in HEP for UE strengthening. Patient continues to make progress and will continue to be followed by acute OT.   ? ?Recommendations for follow up therapy are one component of a multi-disciplinary discharge planning process, led by the attending physician.  Recommendations may be updated based on patient status, additional functional criteria and insurance authorization. ?   ?Follow Up Recommendations ? Skilled nursing-short term rehab (<3 hours/day)  ?  ?Assistance Recommended at Discharge Intermittent Supervision/Assistance  ?Patient can return home with the following ? A little help with walking and/or transfers;A little help with bathing/dressing/bathroom;Assistance with cooking/housework;Assist for transportation;Help with stairs or ramp for entrance ?  ?Equipment Recommendations ? None recommended by OT  ?  ?Recommendations for Other Services   ? ?  ?Precautions / Restrictions Precautions ?Precautions: Fall;Other (comment) ?Precaution Comments: watch BP ?Restrictions ?Weight Bearing Restrictions: No  ? ? ?  ? ?Mobility Bed  Mobility ?Overal bed mobility: Modified Independent ?  ?  ?  ?  ?  ?  ?General bed mobility comments: increased time ?  ? ?Transfers ?Overall transfer level: Needs assistance ?Equipment used: Rolling walker (2 wheels) ?Transfers: Sit to/from Stand ?Sit to Stand: Min guard ?  ?  ?  ?  ?  ?General transfer comment: performed toilet transfer and returned to EOB following each task ?  ?  ?Balance Overall balance assessment: Needs assistance ?Sitting-balance support: No upper extremity supported, Feet supported ?Sitting balance-Leahy Scale: Good ?Sitting balance - Comments: able to sit on EOB without assistance ?  ?Standing balance support: During functional activity, Reliant on assistive device for balance, Bilateral upper extremity supported ?Standing balance-Leahy Scale: Poor ?Standing balance comment: able to use one extremity for functional tasks while standing ?  ?  ?  ?  ?  ?  ?  ?  ?  ?  ?  ?   ? ?ADL either performed or assessed with clinical judgement  ? ?ADL Overall ADL's : Needs assistance/impaired ?  ?  ?  ?  ?  ?  ?  ?  ?  ?  ?  ?  ?Toilet Transfer: Min guard;Ambulation;Rolling walker (2 wheels) ?Toilet Transfer Details (indicate cue type and reason): ambulated to bathroom with RW and min guard assist for safety ?  ?  ?  ?  ?  ?General ADL Comments: performed item retrieval from closet and transported to sink with RW and min guard assist and verbal cues ?  ? ?Extremity/Trunk Assessment   ?  ?  ?  ?  ?  ? ?Vision   ?  ?  ?Perception   ?  ?Praxis   ?  ? ?  Cognition Arousal/Alertness: Lethargic ?Behavior During Therapy: Flat affect ?Overall Cognitive Status: No family/caregiver present to determine baseline cognitive functioning ?  ?  ?  ?  ?  ?  ?  ?  ?  ?  ?  ?  ?  ?  ?  ?  ?General Comments: able to follow directions but often required increased time to perform ?  ?  ?   ?Exercises Exercises: General Upper Extremity ?General Exercises - Upper Extremity ?Shoulder Flexion: Strengthening, Both, 10 reps,  Theraband ?Theraband Level (Shoulder Flexion): Level 2 (Red) ?Shoulder ABduction: Strengthening, Both, 10 reps, Theraband ?Theraband Level (Shoulder Abduction): Level 2 (Red) ?Elbow Flexion: Strengthening, Both, 10 reps, Theraband ?Theraband Level (Elbow Flexion): Level 2 (Red) ?Elbow Extension: Strengthening, Both, 10 reps, Theraband ?Theraband Level (Elbow Extension): Level 2 (Red) ? ?  ?Shoulder Instructions   ? ? ?  ?General Comments    ? ? ?Pertinent Vitals/ Pain       Pain Assessment ?Pain Assessment: 0-10 ?Pain Score: 7  ?Pain Location: LLE ?Pain Descriptors / Indicators: Discomfort, Sore ?Pain Intervention(s): Monitored during session, Limited activity within patient's tolerance, Repositioned ? ?Home Living   ?  ?  ?  ?  ?  ?  ?  ?  ?  ?  ?  ?  ?  ?  ?  ?  ?  ?  ? ?  ?Prior Functioning/Environment    ?  ?  ?  ?   ? ?Frequency ? Min 2X/week  ? ? ? ? ?  ?Progress Toward Goals ? ?OT Goals(current goals can now be found in the care plan section) ? Progress towards OT goals: Progressing toward goals ? ?Acute Rehab OT Goals ?Patient Stated Goal: get better ?OT Goal Formulation: With patient ?Time For Goal Achievement: 09/28/21 ?Potential to Achieve Goals: Good ?ADL Goals ?Pt Will Perform Lower Body Dressing: sit to/from stand ?Pt Will Transfer to Toilet: with modified independence;ambulating ?Pt/caregiver will Perform Home Exercise Program: Increased strength;Both right and left upper extremity;With theraband;Independently;With written HEP provided ?Additional ADL Goal #1: Pt to increase standing activity tolerance > 10 min during ADLs/IADLs to maximize overall endurance ?Additional ADL Goal #2: Pt to demo ability to gather ADLs/IADL items MOD I without LOB  ?Plan Discharge plan remains appropriate   ? ?Co-evaluation ? ? ?   ?  ?  ?  ?  ? ?  ?AM-PAC OT "6 Clicks" Daily Activity     ?Outcome Measure ? ? Help from another person eating meals?: None ?Help from another person taking care of personal grooming?: A  Little ?Help from another person toileting, which includes using toliet, bedpan, or urinal?: A Little ?Help from another person bathing (including washing, rinsing, drying)?: A Little ?Help from another person to put on and taking off regular upper body clothing?: A Little ?Help from another person to put on and taking off regular lower body clothing?: A Little ?6 Click Score: 19 ? ?  ?End of Session Equipment Utilized During Treatment: Gait belt;Rolling walker (2 wheels) ? ?OT Visit Diagnosis: Unsteadiness on feet (R26.81);Other abnormalities of gait and mobility (R26.89);Muscle weakness (generalized) (M62.81) ?  ?Activity Tolerance Patient tolerated treatment well ?  ?Patient Left in bed;with call bell/phone within reach ?  ?Nurse Communication Mobility status ?  ? ?   ? ?Time: 2025-4270 ?OT Time Calculation (min): 24 min ? ?Charges: OT General Charges ?$OT Visit: 1 Visit ?OT Treatments ?$Self Care/Home Management : 8-22 mins ?$Therapeutic Exercise: 8-22 mins ? ?Lodema Hong, OTA ?  Acute Rehabilitation Services  ?Pager [phone] ?Office [phone] ? ? ?Trixie Dredge ?09/15/2021, 12:43 PM ?

## 2021-09-15 NOTE — Progress Notes (Signed)
Pt receives out-pt HD at [facility] on TTS. Pt arrives at 11:45 for 12:05 chair time. Will assist as needed.  ? ?Melven Sartorius ?Renal Navigator ?[phone] ?

## 2021-09-15 NOTE — Hospital Course (Addendum)
The patient is a 66 year old African-American male with past medical history significant for but limited to end-stage renal disease on hemodialysis Tuesdays Thursdays and Saturdays with history of noncompliance to hemodialysis, history of crack cocaine dependence, atrial fibrillation and history of medication noncompliance, history of COPD, history of anemia chronic kidney disease, hypertension as well as other comorbidities who presented to the ED with worsening shortness of breath, intermittent chest pain, burping and a bout of nausea and emesis the day prior to admission.  He did have some intermittent chest pain but was chest pain-free at the time of admission and noted some lower extremity edema.  He states that due to his crack cocaine dependence he missed 3 sessions of hemodialysis and he felt that he needed urgent hemodialysis.  He did endorse some melanotic stools over the weekend but denies any iron use.  He had no fevers or chills or abdominal pain or diarrhea.  He did endorse some feelings of being hot and sweaty.  Upon arrival in the ED he had a blood pressure 207/102 with a respiratory rate of 23.  Chest x-ray was obtained and showed cardiomegaly with central pulmonary vascular congestion and EKG showed normal sinus rhythm.  In the ED he is stabilized and given IV labetalol for his hypertension urgency.  Nephrology was consulted for further evaluation for his uremia secondary to his hemodialysis noncompliance.  He was noted to have some asterixis on examination as well as some mild tremors and did have some nausea and emesis the day prior.  Patient was taken for hemodialysis on 09/13/2021 with improvement in his symptoms.  His uremia resolved with hemodialysis and PT OT evaluated and recommending SNF given his debility.  He is now back on regularly scheduled dialysis and has a bed offer at [facility] with [employer] Authorization being obtained.  ? ?He is medically stable to D/C to SNF and follow up  with PCP and Nephrology in the outpatient setting.  ?

## 2021-09-15 NOTE — TOC Progression Note (Signed)
Transition of Care (TOC) - Progression Note  ? ? ?Patient Details  ?Name: Raymond [name] ?MRN: [id number] ?Date of Birth: 19-Jul-1955 ? ?Transition of Care (TOC) CM/SW Contact  ?Vinie Sill, LCSW ?Phone Number: ?09/15/2021, 9:46 AM ? ?Clinical Narrative:    ? ?Patient has no bed offers at this time. ? ?Expected Discharge Plan: [place] ?Barriers to Discharge: Ship broker, Continued Medical Work up, SNF Pending bed offer ? ?Expected Discharge Plan and Services ?Expected Discharge Plan: [place] ?In-house Referral: Clinical Social Work ?  ?  ?Living arrangements for the past 2 months:  (different friends) ?                ?  ?  ?  ?  ?  ?  ?  ?  ?  ?  ? ? ?Social Determinants of Health (SDOH) Interventions ?  ? ?Readmission Risk Interventions ?Readmission Risk Prevention Plan 06/22/2020  ?Transportation Screening Complete  ?Medication Review Press photographer) Complete  ?PCP or Specialist appointment within 3-5 days of discharge Complete  ?[place] or Home Care Consult Complete  ?SW Recovery Care/Counseling Consult Complete  ?Palliative Care Screening Not Applicable  ?[place] Patient Refused  ?Some recent data might be hidden  ? ? ?

## 2021-09-16 DIAGNOSIS — D61818 Other pancytopenia: Secondary | ICD-10-CM

## 2021-09-16 DIAGNOSIS — J438 Other emphysema: Secondary | ICD-10-CM

## 2021-09-16 LAB — COMPREHENSIVE METABOLIC PANEL
ALT: 6 U/L (ref 0–44)
AST: 7 U/L — ABNORMAL LOW (ref 15–41)
Albumin: 2.9 g/dL — ABNORMAL LOW (ref 3.5–5.0)
Alkaline Phosphatase: 41 U/L (ref 38–126)
Anion gap: 11 (ref 5–15)
BUN: 28 mg/dL — ABNORMAL HIGH (ref 8–23)
CO2: 28 mmol/L (ref 22–32)
Calcium: 9.5 mg/dL (ref 8.9–10.3)
Chloride: 96 mmol/L — ABNORMAL LOW (ref 98–111)
Creatinine, Ser: 10.13 mg/dL — ABNORMAL HIGH (ref 0.61–1.24)
GFR, Estimated: 5 mL/min — ABNORMAL LOW (ref >60)
Glucose, Bld: 105 mg/dL — ABNORMAL HIGH (ref 70–99)
Potassium: 4.4 mmol/L (ref 3.5–5.1)
Sodium: 135 mmol/L (ref 135–145)
Total Bilirubin: 0.3 mg/dL (ref 0.3–1.2)
Total Protein: 6.8 g/dL (ref 6.5–8.1)

## 2021-09-16 LAB — CBC WITH DIFFERENTIAL/PLATELET
Abs Immature Granulocytes: 0.02 10*3/uL (ref 0.00–0.07)
Basophils Absolute: 0 10*3/uL (ref 0.0–0.1)
Basophils Relative: 0 %
Eosinophils Absolute: 0 10*3/uL (ref 0.0–0.5)
Eosinophils Relative: 1 %
HCT: 25.5 % — ABNORMAL LOW (ref 39.0–52.0)
Hemoglobin: 8.1 g/dL — ABNORMAL LOW (ref 13.0–17.0)
Immature Granulocytes: 1 %
Lymphocytes Relative: 32 %
Lymphs Abs: 1.1 10*3/uL (ref 0.7–4.0)
MCH: 32.7 pg (ref 26.0–34.0)
MCHC: 31.8 g/dL (ref 30.0–36.0)
MCV: 102.8 fL — ABNORMAL HIGH (ref 80.0–100.0)
Monocytes Absolute: 0.6 10*3/uL (ref 0.1–1.0)
Monocytes Relative: 17 %
Neutro Abs: 1.8 10*3/uL (ref 1.7–7.7)
Neutrophils Relative %: 49 %
Platelets: 137 10*3/uL — ABNORMAL LOW (ref 150–400)
RBC: 2.48 MIL/uL — ABNORMAL LOW (ref 4.22–5.81)
RDW: 20 % — ABNORMAL HIGH (ref 11.5–15.5)
WBC: 3.5 10*3/uL — ABNORMAL LOW (ref 4.0–10.5)
nRBC: 0 % (ref 0.0–0.2)

## 2021-09-16 LAB — MAGNESIUM: Magnesium: 2.1 mg/dL (ref 1.7–2.4)

## 2021-09-16 LAB — PHOSPHORUS: Phosphorus: 4.2 mg/dL (ref 2.5–4.6)

## 2021-09-16 LAB — GLUCOSE, CAPILLARY: Glucose-Capillary: 93 mg/dL (ref 70–99)

## 2021-09-16 MED ORDER — HEPARIN SODIUM (PORCINE) 5000 UNIT/ML IJ SOLN
5000.0000 [IU] | Freq: Three times a day (TID) | INTRAMUSCULAR | Status: DC
  Administered 2021-09-16 – 2021-09-17 (×3): 5000 [IU] via SUBCUTANEOUS
  Filled 2021-09-16 (×3): qty 1

## 2021-09-16 NOTE — Plan of Care (Signed)

## 2021-09-16 NOTE — Progress Notes (Signed)
PROGRESS NOTE    Raymond [name]  YIR:[id number] DOB: [birth year] DOA: 09/13/2021 PCP: [name], FNP   Brief Narrative:  The patient is a 66 year old African-American male with past medical history significant for but limited to end-stage renal disease on hemodialysis Tuesdays Thursdays and Saturdays with history of noncompliance to hemodialysis, history of crack cocaine dependence, atrial fibrillation and history of medication noncompliance, history of COPD, history of anemia chronic kidney disease, hypertension as well as other comorbidities who presented to the ED with worsening shortness of breath, intermittent chest pain, burping and a bout of nausea and emesis the day prior to admission.  He did have some intermittent chest pain but was chest pain-free at the time of admission and noted some lower extremity edema.  He states that due to his crack cocaine dependence he missed 3 sessions of hemodialysis and he felt that he needed urgent hemodialysis.  He did endorse some melanotic stools over the weekend but denies any iron use.  He had no fevers or chills or abdominal pain or diarrhea.  He did endorse some feelings of being hot and sweaty.  Upon arrival in the ED he had a blood pressure 207/102 with a respiratory rate of 23.  Chest x-ray was obtained and showed cardiomegaly with central pulmonary vascular congestion and EKG showed normal sinus rhythm.  In the ED he is stabilized and given IV labetalol for his hypertension urgency.  Nephrology was consulted for further evaluation for his uremia secondary to his hemodialysis noncompliance.  He was noted to have some asterixis on examination as well as some mild tremors and did have some nausea and emesis the day prior.  Patient was taken for hemodialysis on 09/13/2021 with improvement in his symptoms.  His uremia resolved with hemodialysis and PT OT evaluated and recommending SNF given his debility.  He is now back on regularly scheduled dialysis and  currently has no SNF bed offers at this time.     Assessment and Plan: * Uremia -Secondary to hemodialysis noncompliance. -Patient presented to the ED with shortness of breath, stating he feels like he needs urgent hemodialysis as he is missed about 2-3 hemodialysis sessions.  Patient also with some complaints of intermittent chest pain. -Patient noted to have some asterixis on examination as well as some mild tremors on presentation which have improved  -Patient had some nausea and emesis 1 day prior to admission. -Patient seen in consultation by nephrology and patient underwent hemodialysis last night as well as this morning.   -Patient with some complaints of extremity cramping.   -Improving clinically overall and appears back to his baseline.   -Per nephrology he will be dialyzed today to get back on his normal schedule -Patient's BUNs/creatinine went from 102/19.45 ->18/7.61 -> 28/10.13 uremic symptoms are improved -Further care per nephrology and is getting Dialysis today   Hypertensive urgency - Secondary to medication noncompliance. -Per ED physician patient received a dose of labetalol in the ED. -Blood pressure better controlled with resumption of home regimen of Norvasc, Coreg and hemodialysis.   -Continue to monitor blood pressures per protocol; last blood pressure reading was 111/69 -IV hydralazine as needed.    ESRD (end stage renal disease) ([place]) -Patient noted to have missed about 2-3 hemodialysis sessions per patient secondary to his crack cocaine dependence. -Patient seen in consultation by nephrology and patient underwent hemodialysis last night and this morning.   -Continue calcitriol, Sensipar, aranesp, Rena-Vite, Renvela. -Further Care Per nephrology and getting dialysis today.  -  Patient's BUN/Cr is now 28/10.13  Thrombocytopenia ([place]) -Patient's Platelet Count went from 173 -> 147 -> 132 -> 137 -Continue to Monitor and Trend -Repeat CBC in the AM   Severe  episode of recurrent major depressive disorder, without psychotic features ([place]) - Patient with no suicidal or homicidal ideation. -Not on any medications prior to admission per med rec. -Outpatient follow-up with Psychiatry -Continue to Follow   Type 2 diabetes mellitus with other diabetic kidney complication ([place]) - Patient denies any history of diabetes mellitus however noted on the records. -Hemoglobin A1c 4.6.   -Blood sugars have been ranging from 99-111 on daily BMP and CBGs ranging from 89-93  Pancytopenia ([place]) -Patient's WBC is now 3.5 -Patient's Hgb/Hct is now 8.1/25.5 with an MCV of 102.8 -Patient's Platelet Count is now 137 -Continue to Monitor and Trend and repeat CBC in the AM  Melena - Patient stated had some melanotic stools this weekend. -Hemoglobin relatively stable and now 8.1/25.5 -Anemia panel consistent with anemia of chronic disease as it showed an iron level of 60, U IBC 224, TIBC 284, saturation ratios of 21%, ferritin level 148, folate level 15.4, vitamin B12 level 633 -Continue PPI twice daily.   -Transfusion threshold hemoglobin < 7. -May discuss with Gastroenterology for further evaluation recommendations and he underwent an enteroscopy back in July of last year [facility]; FOBT is negative    Polysubstance (excluding opioids) dependence ([place]) -Patient with crack cocaine dependence. -Patient motivated for cessation and asking for help for his crack cocaine dependence. -Initiated clonidine detox protocol but has now completed  -TOC consulted.   -Hopefully once medically stable can be discharged directly to rehab facility.   Chest pain -Likely secondary to dyspnea/shortness of breath/volume overload secondary to missed hemodialysis sessions and hypertensive urgency. -Currently chest pain-free. -Cardiac enzymes mildly elevated but flattened and lower than prior troponins. -EKG with normal sinus rhythm with LVH with no ischemic changes noted. -Patient  undergoing HD today  -His chest pain is resolved and will need to continue monitor carefully  PAF (paroxysmal atrial fibrillation) (HCC) -Has hx of PAF -Patient noncompliant with medications. -Currently normal sinus rhythm in the ED. -Continue home regimen Carvedilol 12.5 mg po BID for rate control as well as Amiodarone 200 mg po Daily  -Patient did have some complaints of melanotic stools, noted to be anemic; No longer taking Anticoagulation with Apixaban given prior Symptomatic anemia/GIB: Hx gastric/small bowel AVMs. He then had nosebleeds x2 Nov 2022 requiring ER visits.  -FOBT is negative  -Hemoglobin/hematocrit is relatively stable and went from 7.4/23.5 -> 8.7/27.8 -> 8.1/25.5 with an MCV of 102.8 -Continue to Monitor for S/Sx of Bleeding   Non-compliance with renal dialysis [facility])  Patient for HD x3 since admission.. -Importance of compliance with hemodialysis stressed to patient. -Per nephrology. -His BUNs/creatinine has drastically improved and went from 102/19.45 -> 18/7.61 -> 28/10.13 -Hold further nephrotoxic medications, contrast dyes, hypotension and renally adjust medications  PAD (peripheral artery disease) (HCC) -Continue statin. -Previously on Eliquis but no longer taking it now given hx of Symptomatic Anemia and Hx of GI Bleeds -Continue to Monitor   Dyspnea - Likely secondary to volume overload from missed hemodialysis sessions and hypertensive urgency. -Patient currently 97% on room air. -SpO2: 95 % O2 Flow Rate (L/min): 2 L/min -Patient seen by nephrology and patient underwent hemodialysis yesterday and this morning.   -Next scheduled dialysis session is on 09/16/2021 -Resume home antihypertensive medications. -Continue Carvedilol 12.5 mg po BID and Amlodipine 5 mg po  qHS  ESRD on hemodialysis [facility])  Noted on hemodialysis Tuesday Thursday Saturday on [employer]. -Has had Noncompliance with hemodialysis. -Noted to have missed about 3 sessions of  HD. -Nephrology following patient and patient underwent hemodialysis the day before yesterday and yesterday.  Nephrology plans to get the patient back on regular hemodialysis schedule on Tuesday Thursday Saturdays  COPD (chronic obstructive pulmonary disease) (HCC) -Currently stable. -Continue  Incruse, Dulera, Xopenex and Atrovent nebs as needed. 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 -Anemia panel consistent with anemia of chronic disease as above -Repeat Hgb/Hct improved to 8.7/27.8 yesterday and now is 8.1/25.5 -FOBT pending -Continue PPI twice daily. -Follow H&H. -Transfusion threshold hemoglobin < 7.  HYPERTENSION, BENIGN SYSTEMIC -See hypertensive urgency above.  HYPERCHOLESTEROLEMIA -Continue Rosuvastatin 10 mg po Daily .  DVT prophylaxis: heparin injection 5,000 Units Start: 09/16/21 1400 SCDs Start: 09/13/21 2214 Place and maintain sequential compression device Start: 09/13/21 1722    Code Status: Full Code Family Communication: No family present at bedside   Disposition Plan:  Level of care: Progressive Status is: Inpatient Remains inpatient appropriate because: Awaiting SNF placement    Consultants:  Nephrology   Procedures:  Hemodialysis  Antimicrobials:  Anti-infectives (From admission, onward)    None       Subjective: Seen and examined at bedside in the dialysis unit and he was doing okay.  Denied chest pain or shortness breath.  No nausea or vomiting.  No other concerns or complaints this time is still awaiting SNF placement given that no bed is available at this  time.  Objective: Vitals:   09/16/21 0930 09/16/21 1000 09/16/21 1030 09/16/21 1100  BP: 131/77 (!) 109/58 (!) 109/58 111/69  Pulse: 72 70 70 70  Resp: '18 17 16 17  '$ Temp:      TempSrc:      SpO2:      Weight:      Height:        Intake/Output Summary (Last 24 hours) at 09/16/2021 1148 Last data filed at 09/15/2021 2035 Gross per 24 hour  Intake 360 ml  Output --  Net 360 ml   Filed Weights   09/14/21 1040 09/15/21 0506 09/16/21 0835  Weight: 64.5 kg 64.5 kg 66 kg   Examination: Physical Exam:  Constitutional: WN/WD African-American male currently in no acute distress appears calm in the dialysis unit Respiratory: Diminished to auscultation bilaterally with coarse breath sounds, no wheezing, rales, rhonchi or crackles. Normal respiratory effort and patient is not tachypenic. No accessory muscle use.  Not wearing any supplemental oxygen via nasal cannula Cardiovascular: RRR, no murmurs / rubs / gallops. S1 and S2 auscultated. Abdomen: Soft, non-tender, non-distended. Bowel sounds positive.  GU: Deferred. Psychiatric: Normal judgment and insight. Alert and oriented x 3. Normal mood and appropriate affect.   Data Reviewed: I have personally reviewed following labs and imaging studies  CBC: Recent Labs  Lab 09/13/21 1030 09/14/21 0159 09/15/21 0151 09/16/21 0219  WBC 3.1* 2.7* 2.7* 3.5*  NEUTROABS  --   --   --  1.8  HGB 7.6* 7.4* 8.7* 8.1*  HCT 23.8* 23.5* 27.8* 25.5*  MCV 101.3*  100.0 103.0* 102.8*  PLT 173 147* 132* 330*   Basic Metabolic Panel: Recent Labs  Lab 09/13/21 1030 09/14/21 0159 09/15/21 0151 09/16/21 0219  NA 140 135 132* 135  K 4.9 3.7 4.1 4.4  CL 92* 98 95* 96*  CO2 '27 28 29 28  '$ GLUCOSE 111* 110* 99 105*  BUN 102* 31* 18 28*  CREATININE 19.45* 8.80* 7.61* 10.13*  CALCIUM 10.1 9.4 9.3 9.5  MG  --  2.2 2.1 2.1  PHOS  --  5.3* 4.3 4.2   GFR: Estimated Creatinine Clearance: 6.6 mL/min (A) (by C-G formula based on SCr of 10.13 mg/dL  (H)). Liver Function Tests: Recent Labs  Lab 09/14/21 0159 09/15/21 0151 09/16/21 0219  AST  --   --  7*  ALT  --   --  6  ALKPHOS  --   --  41  BILITOT  --   --  0.3  PROT  --   --  6.8  ALBUMIN 2.9* 3.0* 2.9*   No results for input(s): LIPASE, AMYLASE in the last 168 hours. No results for input(s): AMMONIA in the last 168 hours. Coagulation Profile: No results for input(s): INR, PROTIME in the last 168 hours. Cardiac Enzymes: No results for input(s): CKTOTAL, CKMB, CKMBINDEX, TROPONINI in the last 168 hours. BNP (last 3 results) No results for input(s): PROBNP in the last 8760 hours. HbA1C: Recent Labs    09/14/21 0159  HGBA1C 4.6*   CBG: Recent Labs  Lab 09/14/21 0609 09/16/21 0816  GLUCAP 89 93   Lipid Profile: No results for input(s): CHOL, HDL, LDLCALC, TRIG, CHOLHDL, LDLDIRECT in the last 72 hours. Thyroid Function Tests: No results for input(s): TSH, T4TOTAL, FREET4, T3FREE, THYROIDAB in the last 72 hours. Anemia Panel: Recent Labs    09/14/21 0159  VITAMINB12 633  FOLATE 15.4  FERRITIN 148  TIBC 284  IRON 60   Sepsis Labs: No results for input(s): PROCALCITON, LATICACIDVEN in the last 168 hours.  Recent Results (from the past 240 hour(s))  Resp Panel by RT-PCR (Flu A&B, Covid) Nasopharyngeal Swab     Status: None   Collection Time: 09/13/21 10:21 AM   Specimen: Nasopharyngeal Swab; Nasopharyngeal(NP) swabs in vial transport medium  Result Value Ref Range Status   SARS Coronavirus 2 by RT PCR NEGATIVE NEGATIVE Final    Comment: (NOTE) SARS-CoV-2 target nucleic acids are NOT DETECTED.  The SARS-CoV-2 RNA is generally detectable in upper respiratory specimens during the acute phase of infection. The lowest concentration of SARS-CoV-2 viral copies this assay can detect is 138 copies/mL. A negative result does not preclude SARS-Cov-2 infection and should not be used as the sole basis for treatment or other patient management decisions. A negative  result may occur with  improper specimen collection/handling, submission of specimen other than nasopharyngeal swab, presence of viral mutation(s) within the areas targeted by this assay, and inadequate number of viral copies(<138 copies/mL). A negative result must be combined with clinical observations, patient history, and epidemiological information. The expected result is Negative.  Fact Sheet for Patients:  EntrepreneurPulse.com.au  Fact Sheet for Healthcare Providers:  IncredibleEmployment.be  This test is no t yet approved or cleared by the [place] FDA and  has been authorized for detection and/or diagnosis of SARS-CoV-2 by FDA under an Emergency Use Authorization (EUA). This EUA will remain  in effect (meaning this test can be used) for the duration of the COVID-19 declaration under Section 564(b)(1) of the Act, 21 U.S.C.section 360bbb-3(b)(1), unless the  authorization is terminated  or revoked sooner.       Influenza A by PCR NEGATIVE NEGATIVE Final   Influenza B by PCR NEGATIVE NEGATIVE Final    Comment: (NOTE) The Xpert Xpress SARS-CoV-2/FLU/RSV plus assay is intended as an aid in the diagnosis of influenza from Nasopharyngeal swab specimens and should not be used as a sole basis for treatment. Nasal washings and aspirates are unacceptable for Xpert Xpress SARS-CoV-2/FLU/RSV testing.  Fact Sheet for Patients: EntrepreneurPulse.com.au  Fact Sheet for Healthcare Providers: IncredibleEmployment.be  This test is not yet approved or cleared by the [place] FDA and has been authorized for detection and/or diagnosis of SARS-CoV-2 by FDA under an Emergency Use Authorization (EUA). This EUA will remain in effect (meaning this test can be used) for the duration of the COVID-19 declaration under Section 564(b)(1) of the Act, 21 U.S.C. section 360bbb-3(b)(1), unless the authorization is  terminated or revoked.  Performed at [facility], [place] [address] [place], [place] [address]     Radiology Studies: No results found.  Scheduled Meds:  amiodarone  200 mg Oral Daily   amLODipine  5 mg Oral QHS   calcitRIOL  2 mcg Oral Q T,Th,Sa-HD   carvedilol  12.5 mg Oral BID   Chlorhexidine Gluconate Cloth  6 each Topical Q0600   cinacalcet  180 mg Oral Q T,Th,Sa-HD   [START ON 09/21/2021] darbepoetin (ARANESP) injection - NON-DIALYSIS  200 mcg Subcutaneous Q [id number]   folic acid  1 mg Oral Daily   heparin injection (subcutaneous)  5,000 Units Subcutaneous Q8H   mometasone-formoterol  2 puff Inhalation BID   multivitamin  1 tablet Oral QHS   nicotine  14 mg Transdermal Daily   pantoprazole  40 mg Oral BID AC   rosuvastatin  10 mg Oral Daily   sevelamer carbonate  4,000 mg Oral TID WC   sodium chloride flush  3 mL Intravenous Q12H   sodium chloride flush  3 mL Intravenous Q12H   thiamine  100 mg Oral Daily   umeclidinium bromide  1 puff Inhalation Daily   Continuous Infusions:  sodium chloride      LOS: 3 days   Raiford Noble, DO [facility] Available via Epic secure chat 7am-7pm After these hours, please refer to coverage provider listed on [email or web address] 09/16/2021, 11:48 AM

## 2021-09-16 NOTE — Assessment & Plan Note (Addendum)
-  Patient's WBC is now 3.3 ?-Patient's Hgb/Hct is now 7.9/25.5 with an MCV of 103.2 ?-Patient's Platelet Count is now 124 ?-Continue to Monitor and Trend and repeat CBC in the AM ?

## 2021-09-16 NOTE — Progress Notes (Addendum)
[facility] ?Progress Note ? ?Subjective: no c/o  ? ?Vitals:  ? 09/16/21 1100 09/16/21 1130 09/16/21 1148 09/16/21 1241  ?BP: 111/69 102/60 132/77 109/73  ?Pulse: 70 68 75 81  ?Resp: 17  (!) 22 20  ?Temp:   (!) 97.3 ?F (36.3 ?C) 97.8 ?F (36.6 ?C)  ?TempSrc:   Temporal Oral  ?SpO2:   100% 98%  ?Weight:   66 kg   ?Height:      ? ? ?Exam: ? alert, nad  ? no jvd ? Chest cta bilat ? Cor reg no RG ? Abd soft ntnd no ascites ?  Ext no LE edema ?  Alert, NF, ox3 ?  LUA aVF+bruit ? ? ? Home meds include - amiodarone, norvasc, coreg 12.5 bid, crestor, renvela 4 ac tid, rena-vit, nicotine patch, prns/ vits/ supps ?  ?  ?  ? OP HD: [facility] TTS ?  4h  400/1.5  67kg  2/2 bath  L AVF  Hep none ? - covid neg 3/06, hep B SAg neg 3/07 ?  - rocaltrol 2 ug tiw po ? - mircera 200 mcq g q2 wks, last esa 150 mcg on 2/14 ? - home; renvela 4 ac tid ?  ? ?   CXR - IMPRESSION: Cardiomegaly with central pulmonary vascular congestion. ?  ?Assessment/ Plan: ?Uremia - missed HD x 3. Resolved w/ HD.  ?Debility - for SNF placement  ?Major depression, recurrent - per pmd ?Cocaine - hx of abuse  ?ESRD - on HD TTS.  Missed HD x 3 w/ sig azotemia on admission. Better w/ HD.  ?HTN /vol - cont home meds. At dry wet.  ? 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 Ca and phos in range.  ?Anemia ckd - Hb low at 7.6.  Due for ESA, getting darbe 200 ug weekly while here, 1st dose 3/07 ?Dispo - going to SNF today. They are aware he needs to go to OP HD on Sat.  ?  ? ? ? ? ?Raymond [name] ?09/16/2021, 2:55 PM ? ? ?Recent Labs  ?Lab 09/15/21 ?0151 09/16/21 ?5993  ?K 4.1 4.4  ?BUN 18 28*  ?CREATININE 7.61* 10.13*  ?ALBUMIN 3.0* 2.9*  ?CALCIUM 9.3 9.5  ?PHOS 4.3 4.2  ?HGB 8.7* 8.1*  ? ? ?Inpatient medications: ? amiodarone  200 mg Oral Daily  ? amLODipine  5 mg Oral QHS  ? calcitRIOL  2 mcg Oral Q T,Th,Sa-HD  ? carvedilol  12.5 mg Oral BID  ? Chlorhexidine Gluconate Cloth  6 each Topical Q0600  ? cinacalcet  180  mg Oral Q T,Th,Sa-HD  ? [START ON 09/21/2021] darbepoetin (ARANESP) injection - NON-DIALYSIS  200 mcg Subcutaneous Q Tue-1800  ? folic acid  1 mg Oral Daily  ? heparin injection (subcutaneous)  5,000 Units Subcutaneous Q8H  ? mometasone-formoterol  2 puff Inhalation BID  ? multivitamin  1 tablet Oral QHS  ? nicotine  14 mg Transdermal Daily  ? pantoprazole  40 mg Oral BID AC  ? rosuvastatin  10 mg Oral Daily  ? sevelamer carbonate  4,000 mg Oral TID WC  ? sodium chloride flush  3 mL Intravenous Q12H  ? sodium chloride flush  3 mL Intravenous Q12H  ? thiamine  100 mg Oral Daily  ? umeclidinium bromide  1 puff Inhalation Daily  ? ? sodium chloride    ? ?sodium chloride, acetaminophen **OR** acetaminophen, dicyclomine, hydrALAZINE, hydrOXYzine, ipratropium, levalbuterol, loperamide,  methocarbamol, ondansetron **OR** ondansetron (ZOFRAN) IV, oxyCODONE, polyethylene glycol, sodium chloride flush, sorbitol ? ? ? ? ? ? ?

## 2021-09-16 NOTE — Progress Notes (Signed)
PT Cancellation Note ? ?Patient Details ?Name: ATIBA KIMBERLIN ?MRN: [id number] ?DOB: Aug 01, 1955 ? ? ?Cancelled Treatment:    Reason Eval/Treat Not Completed: (P) Patient at procedure or test/unavailable (pt at HD dept). Did not have time to re-attempt in afternoon. Will continue efforts next date per PT plan of care as schedule permits. ? ? ?Kara Pacer Laruen Risser ?09/16/2021, 4:46 PM *delayed entry ? ? ?

## 2021-09-16 NOTE — TOC Progression Note (Signed)
Transition of Care (TOC) - Progression Note  ? ? ?Patient Details  ?Name: BROADY LAFOY ?MRN: [id number] ?Date of Birth: Feb 14, 1956 ? ?Transition of Care (TOC) CM/SW Contact  ?Vinie Sill, LCSW ?Phone Number: ?09/16/2021, 4:37 PM ? ?Clinical Narrative:    ? ?CSW spoke with [facility] Admission-  SNF reports patient has left AMA and  refuse to go to dialysis, however, they are willing to accept patient it he agreeable to be compliant and he is agreeable to return. ? ?CSW met with patient at bedside -informed of bed offer with [facility]. CSW pressed upon patient  the importance of being compliant at SNF or face the probably of losing placement along with any future placement opportunities. CSW explained this can be the beginning of the road to recovery. Patient states understanding and voices he will be compliant at the SNF.  ? ?CSW started Lexicographer # [phone] ? ?TOC will continue to follow and assist with discharge planning.  ? ?Thurmond Butts, MSW, LCSW ?Clinical Social Worker ? ? ? ?Expected Discharge Plan: [place] ?Barriers to Discharge: Ship broker, Continued Medical Work up, SNF Pending bed offer ? ?Expected Discharge Plan and Services ?Expected Discharge Plan: [place] ?In-house Referral: Clinical Social Work ?  ?  ?Living arrangements for the past 2 months:  (different friends) ?                ?  ?  ?  ?  ?  ?  ?  ?  ?  ?  ? ? ?Social Determinants of Health (SDOH) Interventions ?  ? ?Readmission Risk Interventions ?Readmission Risk Prevention Plan 06/22/2020  ?Transportation Screening Complete  ?Medication Review Press photographer) Complete  ?PCP or Specialist appointment within 3-5 days of discharge Complete  ?[place] or Home Care Consult Complete  ?SW Recovery Care/Counseling Consult Complete  ?Palliative Care Screening Not Applicable  ?[place] Patient Refused  ?Some recent data might be hidden  ? ? ?

## 2021-09-17 ENCOUNTER — Other Ambulatory Visit (HOSPITAL_COMMUNITY): Payer: Self-pay

## 2021-09-17 DIAGNOSIS — R0600 Dyspnea, unspecified: Secondary | ICD-10-CM

## 2021-09-17 LAB — COMPREHENSIVE METABOLIC PANEL
ALT: 7 U/L (ref 0–44)
AST: 9 U/L — ABNORMAL LOW (ref 15–41)
Albumin: 2.7 g/dL — ABNORMAL LOW (ref 3.5–5.0)
Alkaline Phosphatase: 39 U/L (ref 38–126)
Anion gap: 6 (ref 5–15)
BUN: 14 mg/dL (ref 8–23)
CO2: 31 mmol/L (ref 22–32)
Calcium: 8.7 mg/dL — ABNORMAL LOW (ref 8.9–10.3)
Chloride: 96 mmol/L — ABNORMAL LOW (ref 98–111)
Creatinine, Ser: 6.96 mg/dL — ABNORMAL HIGH (ref 0.61–1.24)
GFR, Estimated: 8 mL/min — ABNORMAL LOW (ref >60)
Glucose, Bld: 85 mg/dL (ref 70–99)
Potassium: 4.1 mmol/L (ref 3.5–5.1)
Sodium: 133 mmol/L — ABNORMAL LOW (ref 135–145)
Total Bilirubin: 0.5 mg/dL (ref 0.3–1.2)
Total Protein: 6.6 g/dL (ref 6.5–8.1)

## 2021-09-17 LAB — CBC WITH DIFFERENTIAL/PLATELET
Abs Immature Granulocytes: 0.01 10*3/uL (ref 0.00–0.07)
Basophils Absolute: 0 10*3/uL (ref 0.0–0.1)
Basophils Relative: 0 %
Eosinophils Absolute: 0.1 10*3/uL (ref 0.0–0.5)
Eosinophils Relative: 2 %
HCT: 25.5 % — ABNORMAL LOW (ref 39.0–52.0)
Hemoglobin: 7.9 g/dL — ABNORMAL LOW (ref 13.0–17.0)
Immature Granulocytes: 0 %
Lymphocytes Relative: 31 %
Lymphs Abs: 1 10*3/uL (ref 0.7–4.0)
MCH: 32 pg (ref 26.0–34.0)
MCHC: 31 g/dL (ref 30.0–36.0)
MCV: 103.2 fL — ABNORMAL HIGH (ref 80.0–100.0)
Monocytes Absolute: 0.7 10*3/uL (ref 0.1–1.0)
Monocytes Relative: 22 %
Neutro Abs: 1.5 10*3/uL — ABNORMAL LOW (ref 1.7–7.7)
Neutrophils Relative %: 45 %
Platelets: 124 10*3/uL — ABNORMAL LOW (ref 150–400)
RBC: 2.47 MIL/uL — ABNORMAL LOW (ref 4.22–5.81)
RDW: 20 % — ABNORMAL HIGH (ref 11.5–15.5)
WBC: 3.3 10*3/uL — ABNORMAL LOW (ref 4.0–10.5)
nRBC: 0 % (ref 0.0–0.2)

## 2021-09-17 LAB — GLUCOSE, CAPILLARY: Glucose-Capillary: 68 mg/dL — ABNORMAL LOW (ref 70–99)

## 2021-09-17 LAB — PHOSPHORUS: Phosphorus: 3.7 mg/dL (ref 2.5–4.6)

## 2021-09-17 LAB — MAGNESIUM: Magnesium: 1.9 mg/dL (ref 1.7–2.4)

## 2021-09-17 MED ORDER — ACETAMINOPHEN 325 MG PO TABS: 650.0000 mg | ORAL_TABLET | Freq: Four times a day (QID) | ORAL | Status: DC | PRN

## 2021-09-17 MED ORDER — FOLIC ACID 1 MG PO TABS
1.0000 mg | ORAL_TABLET | Freq: Every day | ORAL | 1 refills | Status: DC
Start: 2021-09-17 — End: 2021-12-20

## 2021-09-17 MED ORDER — SEVELAMER CARBONATE 800 MG PO TABS
4000.0000 mg | ORAL_TABLET | Freq: Three times a day (TID) | ORAL | Status: AC
Start: 2021-09-17 — End: ?

## 2021-09-17 MED ORDER — MOMETASONE FURO-FORMOTEROL FUM 200-5 MCG/ACT IN AERO: 2.0000 | INHALATION_SPRAY | Freq: Two times a day (BID) | RESPIRATORY_TRACT | Status: DC

## 2021-09-17 MED ORDER — UMECLIDINIUM BROMIDE 62.5 MCG/ACT IN AEPB: 1.0000 | INHALATION_SPRAY | Freq: Every day | RESPIRATORY_TRACT | Status: DC

## 2021-09-17 MED ORDER — THIAMINE HCL 100 MG PO TABS: 100.0000 mg | ORAL_TABLET | Freq: Every day | ORAL | 1 refills | Status: DC

## 2021-09-17 MED ORDER — NICOTINE 14 MG/24HR TD PT24: 14.0000 mg | MEDICATED_PATCH | Freq: Every day | TRANSDERMAL | 0 refills | Status: DC

## 2021-09-17 MED ORDER — RENA-VITE PO TABS: 1.0000 | ORAL_TABLET | Freq: Every day | ORAL | 0 refills | Status: AC

## 2021-09-17 MED ORDER — PANTOPRAZOLE SODIUM 40 MG PO TBEC: 40.0000 mg | DELAYED_RELEASE_TABLET | Freq: Two times a day (BID) | ORAL | Status: DC

## 2021-09-17 MED ORDER — POLYETHYLENE GLYCOL 3350 17 GM/SCOOP PO POWD
17.0000 g | Freq: Every day | ORAL | 0 refills | Status: DC | PRN
  Filled 2021-09-17: qty 14, 14d supply, fill #0

## 2021-09-17 NOTE — Progress Notes (Signed)
[facility] ?Progress Note ? ?Subjective: no c/o  ? ?Vitals:  ? 09/16/21 2333 09/17/21 0403 09/17/21 0407 09/17/21 2263  ?BP: 122/71 131/88  (!) 162/94  ?Pulse: 73   75  ?Resp: '17 16  20  '$ ?Temp: 97.8 ?F (36.6 ?C) 98 ?F (36.7 ?C)  98 ?F (36.7 ?C)  ?TempSrc: Oral Oral  Oral  ?SpO2: 100% 98%  99%  ?Weight:   67.7 kg   ?Height:      ? ? ?Exam: ? alert, nad  ? no jvd ? Chest cta bilat ? Cor reg no RG ? Abd soft ntnd no ascites ?  Ext no LE edema ?  Alert, NF, ox3 ?  LUA aVF+bruit ? ? ? Home meds include - amiodarone, norvasc, coreg 12.5 bid, crestor, renvela 4 ac tid, rena-vit, nicotine patch, prns/ vits/ supps ?  ?  ?  ? OP HD: [facility] TTS ?  4h  400/1.5  67kg  2/2 bath  L AVF  Hep none ? - covid neg 3/06, hep B SAg neg 3/07 ?  - rocaltrol 2 ug tiw po ? - mircera 200 mcq g q2 wks, last esa 150 mcg on 2/14 ? - home; renvela 4 ac tid ?  ? ?   CXR - IMPRESSION: Cardiomegaly with central pulmonary vascular congestion. ?  ?Assessment/ Plan: ?Uremia - missed HD x 3. Resolved w/ HD.  ?Debility - for SNF placement, dc'ing today ?Major depression, recurrent - per pmd ?Cocaine - hx of abuse  ?ESRD - on HD TTS.  Missed HD x 3 w/ sig azotemia on admission. Better now. HD TTS.  ?HTN /vol - cont home meds. At dry wt today.  ? 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 Ca and phos in range.  ?Anemia ckd - Hb low at 7.6.  Due for ESA, got darbe 200 mcg one dose given 3/07 ?Dispo - dc today ?  ? ? ? ? ?Rob Titus Drone ?09/17/2021, 11:02 AM ? ? ?Recent Labs  ?Lab 09/16/21 ?0219 09/17/21 ?0434  ?K 4.4 4.1  ?BUN 28* 14  ?CREATININE 10.13* 6.96*  ?ALBUMIN 2.9* 2.7*  ?CALCIUM 9.5 8.7*  ?PHOS 4.2 3.7  ?HGB 8.1* 7.9*  ? ? ?Inpatient medications: ? amiodarone  200 mg Oral Daily  ? amLODipine  5 mg Oral QHS  ? calcitRIOL  2 mcg Oral Q T,Th,Sa-HD  ? carvedilol  12.5 mg Oral BID  ? Chlorhexidine Gluconate Cloth  6 each Topical Q0600  ? cinacalcet  180 mg Oral Q T,Th,Sa-HD  ? [START ON 09/21/2021]  darbepoetin (ARANESP) injection - NON-DIALYSIS  200 mcg Subcutaneous Q Tue-1800  ? folic acid  1 mg Oral Daily  ? heparin injection (subcutaneous)  5,000 Units Subcutaneous Q8H  ? mometasone-formoterol  2 puff Inhalation BID  ? multivitamin  1 tablet Oral QHS  ? nicotine  14 mg Transdermal Daily  ? pantoprazole  40 mg Oral BID AC  ? rosuvastatin  10 mg Oral Daily  ? sevelamer carbonate  4,000 mg Oral TID WC  ? sodium chloride flush  3 mL Intravenous Q12H  ? sodium chloride flush  3 mL Intravenous Q12H  ? thiamine  100 mg Oral Daily  ? umeclidinium bromide  1 puff Inhalation Daily  ? ? sodium chloride    ? ?sodium chloride, acetaminophen **OR** acetaminophen, dicyclomine, hydrALAZINE, hydrOXYzine, ipratropium, levalbuterol, loperamide, methocarbamol, ondansetron **OR** ondansetron (ZOFRAN) IV, oxyCODONE, polyethylene glycol, sodium chloride flush,  sorbitol ? ? ? ? ? ? ?

## 2021-09-17 NOTE — Progress Notes (Signed)
Occupational Therapy Treatment ?Patient Details ?Name: Raymond [name] ?MRN: [id number] ?DOB: [birth year] ?Today's Date: 09/17/2021 ? ? ?History of present illness 66 y.o. male presents to [facility] hospital 3/6 for worsening SOB, intermittent chest pain, bout of nausea and emesis, and some LE edema. Pt with 4 admissions since June 2022 for AMS and hypoglycemia, with PEA arrest during September admission. PMH includes ESRD on HDU TTS, COPD, hep C, HTN, CAD, PAD, COPD, crack cocaine dependence, A-fib, and medical noncompliance. ?  ?OT comments ? Patient making good progress with OT treament with min guard for mobility and transfers and patient able to tolerate 8 minutes of standing at sink for self care tasks. Patient was able to perform doffing and donning of feet wear seated on EOB and demonstrated good understanding of HEP. Acute OT to continue to follow   ? ?Recommendations for follow up therapy are one component of a multi-disciplinary discharge planning process, led by the attending physician.  Recommendations may be updated based on patient status, additional functional criteria and insurance authorization. ?   ?Follow Up Recommendations ? Skilled nursing-short term rehab (<3 hours/day)  ?  ?Assistance Recommended at Discharge Intermittent Supervision/Assistance  ?Patient can return home with the following ? A little help with walking and/or transfers;A little help with bathing/dressing/bathroom;Assistance with cooking/housework;Assist for transportation;Help with stairs or ramp for entrance ?  ?Equipment Recommendations ? None recommended by OT  ?  ?Recommendations for Other Services   ? ?  ?Precautions / Restrictions Precautions ?Precautions: Fall;Other (comment) ?Precaution Comments: watch BP ?Restrictions ?Weight Bearing Restrictions: No  ? ? ?  ? ?Mobility Bed Mobility ?Overal bed mobility: Modified Independent ?  ?  ?  ?  ?  ?  ?General bed mobility comments: increased time ?  ? ?Transfers ?Overall transfer level:  Needs assistance ?Equipment used: Rolling walker (2 wheels) ?Transfers: Sit to/from Stand ?Sit to Stand: Min guard ?  ?  ?  ?  ?  ?General transfer comment: min guard for mobility and transfers due to lines and safety ?  ?  ?Balance Overall balance assessment: Needs assistance ?Sitting-balance support: No upper extremity supported, Feet supported ?Sitting balance-Leahy Scale: Good ?  ?  ?Standing balance support: Single extremity supported, No upper extremity supported, During functional activity ?Standing balance-Leahy Scale: Fair ?Standing balance comment: able to standing at sink for self care ?  ?  ?  ?  ?  ?  ?  ?  ?  ?  ?  ?   ? ?ADL either performed or assessed with clinical judgement  ? ?ADL Overall ADL's : Needs assistance/impaired ?  ?  ?Grooming: Wash/dry hands;Wash/dry face;Supervision/safety;Standing ?Grooming Details (indicate cue type and reason): performed standing at sink ?Upper Body Bathing: Supervision/ safety;Standing ?Upper Body Bathing Details (indicate cue type and reason): performed standing at sink ?Lower Body Bathing: Supervison/ safety;Sitting/lateral leans ?Lower Body Bathing Details (indicate cue type and reason): bathed feet seated ?Upper Body Dressing : Set up;Sitting ?Upper Body Dressing Details (indicate cue type and reason): changed gown ?Lower Body Dressing: Supervision/safety;Sitting/lateral leans ?Lower Body Dressing Details (indicate cue type and reason): changed socks seated on EOB ?  ?  ?  ?  ?  ?  ?  ?General ADL Comments: patient was able to perform grooming and UB bathing standing at sink without rest break ?  ? ?Extremity/Trunk Assessment   ?  ?  ?  ?  ?  ? ?Vision   ?  ?  ?Perception   ?  ?Praxis   ?  ? ?  Cognition Arousal/Alertness: Awake/alert ?Behavior During Therapy: Flat affect ?Overall Cognitive Status: No family/caregiver present to determine baseline cognitive functioning ?  ?  ?  ?  ?  ?  ?  ?  ?  ?  ?  ?  ?  ?  ?  ?  ?General Comments: more alert, followed  direction well ?  ?  ?   ?Exercises Exercises: General Upper Extremity ?General Exercises - Upper Extremity ?Shoulder Flexion: Strengthening, Both, 10 reps, Theraband ?Theraband Level (Shoulder Flexion): Level 2 (Red) ?Shoulder ABduction: Strengthening, Both, 10 reps, Theraband ?Theraband Level (Shoulder Abduction): Level 2 (Red) ?Elbow Flexion: Strengthening, Both, 10 reps, Theraband ?Theraband Level (Elbow Flexion): Level 2 (Red) ?Elbow Extension: Strengthening, Both, 10 reps, Theraband ?Theraband Level (Elbow Extension): Level 2 (Red) ? ?  ?Shoulder Instructions   ? ? ?  ?General Comments    ? ? ?Pertinent Vitals/ Pain       Pain Assessment ?Pain Assessment: Faces ?Faces Pain Scale: Hurts little more ?Pain Location: LLE ?Pain Descriptors / Indicators: Discomfort, Sore ?Pain Intervention(s): Monitored during session, Repositioned ? ?Home Living   ?  ?  ?  ?  ?  ?  ?  ?  ?  ?  ?  ?  ?  ?  ?  ?  ?  ?  ? ?  ?Prior Functioning/Environment    ?  ?  ?  ?   ? ?Frequency ? Min 2X/week  ? ? ? ? ?  ?Progress Toward Goals ? ?OT Goals(current goals can now be found in the care plan section) ? Progress towards OT goals: Progressing toward goals ? ?Acute Rehab OT Goals ?Patient Stated Goal: get better ?OT Goal Formulation: With patient ?Time For Goal Achievement: 09/28/21 ?Potential to Achieve Goals: Good ?ADL Goals ?Pt Will Perform Lower Body Dressing: sit to/from stand ?Pt Will Transfer to Toilet: with modified independence;ambulating ?Pt/caregiver will Perform Home Exercise Program: Increased strength;Both right and left upper extremity;With theraband;Independently;With written HEP provided ?Additional ADL Goal #1: Pt to increase standing activity tolerance > 10 min during ADLs/IADLs to maximize overall endurance ?Additional ADL Goal #2: Pt to demo ability to gather ADLs/IADL items MOD I without LOB  ?Plan Discharge plan remains appropriate   ? ?Co-evaluation ? ? ?   ?  ?  ?  ?  ? ?  ?AM-PAC OT "6 Clicks" Daily Activity      ?Outcome Measure ? ? Help from another person eating meals?: None ?Help from another person taking care of personal grooming?: A Little ?Help from another person toileting, which includes using toliet, bedpan, or urinal?: A Little ?Help from another person bathing (including washing, rinsing, drying)?: A Little ?Help from another person to put on and taking off regular upper body clothing?: A Little ?Help from another person to put on and taking off regular lower body clothing?: A Little ?6 Click Score: 19 ? ?  ?End of Session Equipment Utilized During Treatment: Rolling walker (2 wheels) ? ?OT Visit Diagnosis: Unsteadiness on feet (R26.81);Other abnormalities of gait and mobility (R26.89);Muscle weakness (generalized) (M62.81) ?  ?Activity Tolerance Patient tolerated treatment well ?  ?Patient Left in bed;with call bell/phone within reach ?  ?Nurse Communication Mobility status ?  ? ?   ? ?Time: 1610-9604 ?OT Time Calculation (min): 28 min ? ?Charges: OT General Charges ?$OT Visit: 1 Visit ?OT Treatments ?$Self Care/Home Management : 8-22 mins ?$Therapeutic Exercise: 8-22 mins ? ?Lodema Hong, OTA ?Acute Rehabilitation Services  ?Pager [phone] ?Office [phone] ? ? ?  [place] ?09/17/2021, 9:10 AM ?

## 2021-09-17 NOTE — TOC Transition Note (Signed)
Transition of Care (TOC) - CM/SW Discharge Note ? ? ?Patient Details  ?Name: Raymond [name] ?MRN: [id number] ?Date of Birth: 31-Aug-1955 ? ?Transition of Care (TOC) CM/SW Contact:  ?Vinie Sill, LCSW ?Phone Number: ?09/17/2021, 12:01 PM ? ? ?Clinical Narrative:    ? ?Patient will Discharge to: [facility] ?Discharge Date: 09/17/2021 ?Family Notified: ?Transport By: [facility] ? ?Per MD patient is ready for discharge. RN, patient, and facility notified of discharge. Discharge Summary sent to facility. RN given number for [phone]). Ambulance transport requested for patient.  ? ?CSW again confirmed patient states he will be compliant at SNF. SA/MH resources provided ? ?Clinical Social Worker signing off. ? ?Thurmond Butts, MSW, LCSW ?Clinical Social Worker ? ? ? ? ?Final next level of care: [place] ?Barriers to Discharge: Barriers Resolved ? ? ?Patient Goals and CMS Choice ?  ?  ?  ? ?Discharge Placement ?  ?           ?[name] ?Patient to be transferred to facility by: [facility] ?  ?Patient and family notified of of transfer: 09/17/21 ? ?Discharge Plan and Services ?In-house Referral: Clinical Social Work ?  ?           ?  ?  ?  ?  ?  ?  ?  ?  ?  ?  ? ?Social Determinants of Health (SDOH) Interventions ?  ? ? ?Readmission Risk Interventions ?Readmission Risk Prevention Plan 06/22/2020  ?Transportation Screening Complete  ?Medication Review Press photographer) Complete  ?PCP or Specialist appointment within 3-5 days of discharge Complete  ?[place] or Home Care Consult Complete  ?SW Recovery Care/Counseling Consult Complete  ?Palliative Care Screening Not Applicable  ?[place] Patient Refused  ?Some recent data might be hidden  ? ? ? ? ? ?

## 2021-09-17 NOTE — Care Management Important Message (Signed)
Important Message ? ?Patient Details  ?Name: Raymond [name] ?MRN: [id number] ?Date of Birth: Dec 05, 1955 ? ? ?Medicare Important Message Given:  Yes ? ? ? ? ?Shelda Altes ?09/17/2021, 10:10 AM ?

## 2021-09-17 NOTE — Progress Notes (Signed)
Round on patient today secondary to missed HD treatments. Patient found lying in the bed and agreeable to conversation. Patient reports that he is trying to get on the right track, but that its hard and he gets depressed and starts taking drugs. Patient educated at the bedside about the importance of adhering to his scheduled dialysis treatments and the risk of not attending dialysis as scheduled. Patient also educated on the effects of fluid overload, hyperkalemia and hyperphosphatemia. Patient reports that he hopes going to the nursing home will give him enough time to be away from his environment and he can stick to his sobriety this time. Patient with no questions at this time. Handouts and kidney failure book given to patient. Plan to discharge to SNF today. ? ?Mady Gemma ?Dialysis Nurse Coordinator ?[phone] ?

## 2021-09-17 NOTE — Discharge Summary (Signed)
Physician Discharge Summary   Patient: Raymond [name] MRN: [id number] DOB: Feb 04, 1956  Admit date:     09/13/2021  Discharge date: 09/17/21  Discharge Physician: Kerney Elbe   PCP: [name], FNP   Recommendations at discharge:   Follow up with PCP within 1-2 weeks and repeat CBC, Mag, Phos, and CMP Follow up with Nephrology and continue Dialysis TThSat Follow up with Gastroenterology in the outpatient setting for further evaluation of Melena   Discharge Diagnoses: Principal Problem:   Uremia Active Problems:   Hypertensive urgency   ESRD (end stage renal disease) ([place])   HYPERCHOLESTEROLEMIA   HYPERTENSION, BENIGN SYSTEMIC   Anemia associated with stage 5 chronic renal failure (HCC)   COPD (chronic obstructive pulmonary disease) (HCC)   ESRD on hemodialysis (HCC)   Dyspnea   PAD (peripheral artery disease) (HCC)   Non-compliance with renal dialysis (HCC)   PAF (paroxysmal atrial fibrillation) (HCC)   Chest pain   Polysubstance (excluding opioids) dependence (HCC)   Melena   Pancytopenia ([place])   Type 2 diabetes mellitus with other diabetic kidney complication (HCC)   Severe episode of recurrent major depressive disorder, without psychotic features ([place])   Thrombocytopenia ([place])  Resolved Problems:   * No resolved hospital problems. [facility] Course: The patient is a 66 year old African-American male with past medical history significant for but limited to end-stage renal disease on hemodialysis Tuesdays Thursdays and Saturdays with history of noncompliance to hemodialysis, history of crack cocaine dependence, atrial fibrillation and history of medication noncompliance, history of COPD, history of anemia chronic kidney disease, hypertension as well as other comorbidities who presented to the ED with worsening shortness of breath, intermittent chest pain, burping and a bout of nausea and emesis the day prior to admission.  He did have some intermittent chest pain  but was chest pain-free at the time of admission and noted some lower extremity edema.  He states that due to his crack cocaine dependence he missed 3 sessions of hemodialysis and he felt that he needed urgent hemodialysis.  He did endorse some melanotic stools over the weekend but denies any iron use.  He had no fevers or chills or abdominal pain or diarrhea.  He did endorse some feelings of being hot and sweaty.  Upon arrival in the ED he had a blood pressure 207/102 with a respiratory rate of 23.  Chest x-ray was obtained and showed cardiomegaly with central pulmonary vascular congestion and EKG showed normal sinus rhythm.  In the ED he is stabilized and given IV labetalol for his hypertension urgency.  Nephrology was consulted for further evaluation for his uremia secondary to his hemodialysis noncompliance.  He was noted to have some asterixis on examination as well as some mild tremors and did have some nausea and emesis the day prior.  Patient was taken for hemodialysis on 09/13/2021 with improvement in his symptoms.  His uremia resolved with hemodialysis and PT OT evaluated and recommending SNF given his debility.  He is now back on regularly scheduled dialysis and has a bed offer at [facility] with [employer] Authorization being obtained.   He is medically stable to D/C to SNF and follow up with PCP and Nephrology in the outpatient setting.   Assessment and Plan: * Uremia -Secondary to hemodialysis noncompliance. -Patient presented to the ED with shortness of breath, stating he feels like he needs urgent hemodialysis as he is missed about 2-3 hemodialysis sessions.  Patient also with some complaints of  intermittent chest pain. -Patient noted to have some asterixis on examination as well as some mild tremors on presentation which have improved  -Patient had some nausea and emesis 1 day prior to admission. -Patient seen in consultation by nephrology and patient underwent hemodialysis last night as  well as this morning.   -Patient with some complaints of extremity cramping.   -Improving clinically overall and appears back to his baseline.   -Per nephrology he will be dialyzed today to get back on his normal schedule -Patient's BUNs/creatinine went from 102/19.45 ->18/7.61 -> 28/10.13 -> 14/6.96 and is uremic symptoms are improved -Further care per nephrology and is getting Dialysis tomorrow  Hypertensive urgency - Secondary to medication noncompliance. -Per ED physician patient received a dose of labetalol in the ED. -Blood pressure better controlled with resumption of home regimen of Norvasc, Coreg and hemodialysis.   -Continue to monitor blood pressures per protocol; last blood pressure reading was 162/94 -IV hydralazine as needed.    ESRD (end stage renal disease) ([place]) -Patient noted to have missed about 2-3 hemodialysis sessions per patient secondary to his crack cocaine dependence. -Patient seen in consultation by nephrology and patient underwent hemodialysis last night and this morning.   -Continue calcitriol, Sensipar, aranesp, Rena-Vite, Renvela. -Further Care Per nephrology and getting dialysis today.  -Patient's BUN/Cr is now 14/6.96  Thrombocytopenia ([place]) -Patient's Platelet Count went from 173 -> 147 -> 132 -> 137 -> 124 -Continue to Monitor and Trend -Repeat CBC in the AM   Severe episode of recurrent major depressive disorder, without psychotic features ([place]) - Patient with no suicidal or homicidal ideation. -Not on any medications prior to admission per med rec. -Outpatient follow-up with Psychiatry -Continue to Follow   Type 2 diabetes mellitus with other diabetic kidney complication ([place]) - Patient denies any history of diabetes mellitus however noted on the records. -Hemoglobin A1c 4.6.   -Blood sugars have been ranging from 85-111 on daily BMP and CBGs ranging from 68-93  Pancytopenia ([place]) -Patient's WBC is now 3.3 -Patient's Hgb/Hct is now 7.9/25.5  with an MCV of 103.2 -Patient's Platelet Count is now 124 -Continue to Monitor and Trend and repeat CBC in the AM  Melena - Patient stated had some melanotic stools this weekend. -Hemoglobin relatively stable and now 7.9/25.5 -Anemia panel consistent with anemia of chronic disease as it showed an iron level of 60, U IBC 224, TIBC 284, saturation ratios of 21%, ferritin level 148, folate level 15.4, vitamin B12 level 633 -Continue PPI twice daily.   -Transfusion threshold hemoglobin < 7. -May discuss with Gastroenterology for further evaluation recommendations and he underwent an enteroscopy back in July of last year [facility]; FOBT is negative  -Follow up with GI in the outpatient setting    Polysubstance (excluding opioids) dependence ([place]) -Patient with crack cocaine dependence. -Patient motivated for cessation and asking for help for his crack cocaine dependence. -Initiated clonidine detox protocol but has now completed  -TOC consulted for resources   -He is medically stable can be discharged directly to rehab facility.   Chest pain -Likely secondary to dyspnea/shortness of breath/volume overload secondary to missed hemodialysis sessions and hypertensive urgency. -Currently chest pain-free. -Cardiac enzymes mildly elevated but flattened and lower than prior troponins. -EKG with normal sinus rhythm with LVH with no ischemic changes noted. -Patient undergoing HD tomorrow -His chest pain is resolved and will need to continue monitor carefully  PAF (paroxysmal atrial fibrillation) ([place]) -Has hx of PAF -Patient noncompliant with medications. -Currently normal  sinus rhythm in the ED. -Continue home regimen Carvedilol 12.5 mg po BID for rate control as well as Amiodarone 200 mg po Daily  -Patient did have some complaints of melanotic stools, noted to be anemic; No longer taking Anticoagulation with Apixaban given prior Symptomatic anemia/GIB: Hx gastric/small bowel AVMs. He then had  nosebleeds x2 Nov 2022 requiring ER visits.  -FOBT is negative  -Hemoglobin/hematocrit is relatively stable and went from 7.4/23.5 -> 8.7/27.8 -> 8.1/25.5 -> 7.9/25.5 with an MCV of 103.2 -Continue to Monitor for S/Sx of Bleeding   Non-compliance with renal dialysis [facility])  Patient for HD x3 since admission.. -Importance of compliance with hemodialysis stressed to patient. -Per nephrology. -His BUNs/creatinine has drastically improved and went from 102/19.45 -> 18/7.61 -> 28/10.13 -> 14/6.96 -Hold further nephrotoxic medications, contrast dyes, hypotension and renally adjust medications -Repeat CMP within 1 week   PAD (peripheral artery disease) (HCC) -Continue statin. -Previously on Eliquis but no longer taking it now given hx of Symptomatic Anemia and Hx of GI Bleeds -Continue to Monitor off of AC   Dyspnea - Likely secondary to volume overload from missed hemodialysis sessions and hypertensive urgency. -Patient currently 97% on room air. -SpO2: 95 % O2 Flow Rate (L/min): 2 L/min -Patient seen by nephrology and patient underwent hemodialysis yesterday and this morning.   -Next scheduled dialysis session is on 09/16/2021 -Resume home antihypertensive medications. -Continue Carvedilol 12.5 mg po BID and Amlodipine 5 mg po qHS  ESRD on hemodialysis [facility])  Noted on hemodialysis Tuesday Thursday Saturday on [employer]. -Has had Noncompliance with hemodialysis. -Noted to have missed about 3 sessions of HD. -Nephrology following patient and patient underwent hemodialysis the day before yesterday and yesterday.  Nephrology plans to get the patient back on regular hemodialysis schedule on Tuesday Thursday Saturdays  COPD (chronic obstructive pulmonary disease) (HCC) -Currently stable. -Continue  Incruse, Dulera, Xopenex and Atrovent nebs as needed. 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 -Anemia panel consistent with anemia of chronic disease as above -Repeat Hgb/Hct today is 7.9/25.5 -FOBT Negative  -Continue PPI twice daily. -Follow H&H. -Transfusion threshold hemoglobin < 7.  HYPERTENSION, BENIGN SYSTEMIC -See hypertensive urgency above.  HYPERCHOLESTEROLEMIA -Continue Rosuvastatin 10 mg po Daily .  Pain control - [employer] Controlled Substance Reporting System database was reviewed. and patient was instructed, not to drive, operate heavy machinery, perform activities at heights, swimming or participation in water activities or provide baby-sitting services while on Pain, Sleep and Anxiety Medications; until their outpatient Physician has advised to do so again. Also recommended to not to take more than prescribed Pain, Sleep and Anxiety Medications.   Consultants: Nephrology  Procedures performed: HD  Disposition: Skilled nursing facility Diet recommendation:  Renal diet DISCHARGE MEDICATION: Allergies as of 09/17/2021   No Known Allergies      Medication List     TAKE these medications    acetaminophen 325 MG tablet Commonly known as: TYLENOL Take 2 tablets (650 mg total) by mouth every 6 (six) hours as needed for mild pain (or Fever >/= 101).   albuterol 108 (90 Base) MCG/ACT inhaler Commonly known as: VENTOLIN HFA Inhale 2 puffs into the lungs every 4 (four) hours as needed for  shortness of breath or wheezing.   amiodarone 200 MG tablet Commonly known as: PACERONE Take 200 mg by mouth daily.   amLODipine 5 MG tablet Commonly known as: NORVASC Take 5 mg by mouth at bedtime.   calcitRIOL 0.5 MCG capsule Commonly known as: ROCALTROL Take 4 capsules (2 mcg total) by mouth Every Tuesday,Thursday,and Saturday with dialysis.    carvedilol 12.5 MG tablet Commonly known as: COREG Take 12.5 mg by mouth 2 (two) times daily.   cinacalcet 30 MG tablet Commonly known as: SENSIPAR Take 6 tablets (180 mg total) by mouth Every Tuesday,Thursday,and Saturday with dialysis.   Darbepoetin Alfa 200 MCG/0.4ML Sosy injection Commonly known as: ARANESP Inject 0.4 mLs (200 mcg total) into the vein every Tuesday with hemodialysis.   folic acid 1 MG tablet Commonly known as: FOLVITE Take 1 tablet (1 mg total) by mouth daily.   mometasone-formoterol 200-5 MCG/ACT Aero Commonly known as: DULERA Inhale 2 puffs into the lungs 2 (two) times daily.   multivitamin Tabs tablet Take 1 tablet by mouth at bedtime.   nicotine 14 mg/24hr patch Commonly known as: NICODERM CQ - dosed in mg/24 hours Place 1 patch (14 mg total) onto the skin daily.   pantoprazole 40 MG tablet Commonly known as: PROTONIX Take 1 tablet (40 mg total) by mouth 2 (two) times daily before a meal.   polyethylene glycol 17 g packet Commonly known as: MIRALAX / GLYCOLAX Take 17 g by mouth daily as needed for mild constipation.   rosuvastatin 10 MG tablet Commonly known as: CRESTOR Take 1 tablet (10 mg total) by mouth daily.   sevelamer carbonate 800 MG tablet Commonly known as: RENVELA Take 5 tablets (4,000 mg total) by mouth 3 (three) times daily with meals.   thiamine 100 MG tablet Take 1 tablet (100 mg total) by mouth daily.   umeclidinium bromide 62.5 MCG/ACT Aepb Commonly known as: INCRUSE ELLIPTA Inhale 1 puff into the lungs daily.       Discharge Exam: Filed Weights   09/16/21 0835 09/16/21 1148 09/17/21 0407  Weight: 66 kg 66 kg 67.7 kg   Today's Vitals   09/17/21 0407 09/17/21 0835 09/17/21 0854 09/17/21 0939  BP:  (!) 162/94    Pulse:  75    Resp:  20    Temp:  98 F (36.7 C)    TempSrc:  Oral    SpO2:  99%    Weight: 67.7 kg     Height:      PainSc:   7  Asleep   Body mass index is 22.7 kg/m.;  Examination: Physical  Exam:  Constitutional: Thin chronically ill appearing AAM in NAD appears calm Respiratory: Diminished to auscultation bilaterally with coarse breath sounds, no wheezing, rales, rhonchi or crackles. Normal respiratory effort and patient is not tachypenic. No accessory muscle use. Unlabored breathing  Cardiovascular: RRR, no murmurs / rubs / gallops. S1 and S2 auscultated.  Abdomen: Soft, non-tender, non-distended. Bowel sounds positive.  GU: Deferred. Musculoskeletal: No clubbing / cyanosis of digits/nails. No joint deformity upper and lower extremities.  Skin: No rashes, lesions, ulcers. No induration; Warm and dry.  Neurologic: CN 2-12 grossly intact with no focal deficits.   Condition at discharge: stable  The results of significant diagnostics from this hospitalization (including imaging, microbiology, ancillary and laboratory) are listed below for reference.   Imaging Studies: DG Chest 2 View  Result Date: 09/13/2021 CLINICAL DATA:  Chest pain EXAM: CHEST - 2 VIEW COMPARISON:  Radiograph 05/18/2021 FINDINGS: Unchanged  enlarged cardiac silhouette. Shoulder pulmonary vascular congestion. No large pleural effusion. No visible pneumothorax. No acute osseous abnormality. Left upper extremity stent noted. IMPRESSION: Cardiomegaly with central pulmonary vascular congestion. Electronically Signed   By: Maurine Simmering M.D.   On: 09/13/2021 10:50    Microbiology: Results for orders placed or performed during the hospital encounter of 09/13/21  Resp Panel by RT-PCR (Flu A&B, Covid) Nasopharyngeal Swab     Status: None   Collection Time: 09/13/21 10:21 AM   Specimen: Nasopharyngeal Swab; Nasopharyngeal(NP) swabs in vial transport medium  Result Value Ref Range Status   SARS Coronavirus 2 by RT PCR NEGATIVE NEGATIVE Final    Comment: (NOTE) SARS-CoV-2 target nucleic acids are NOT DETECTED.  The SARS-CoV-2 RNA is generally detectable in upper respiratory specimens during the acute phase of  infection. The lowest concentration of SARS-CoV-2 viral copies this assay can detect is 138 copies/mL. A negative result does not preclude SARS-Cov-2 infection and should not be used as the sole basis for treatment or other patient management decisions. A negative result may occur with  improper specimen collection/handling, submission of specimen other than nasopharyngeal swab, presence of viral mutation(s) within the areas targeted by this assay, and inadequate number of viral copies(<138 copies/mL). A negative result must be combined with clinical observations, patient history, and epidemiological information. The expected result is Negative.  Fact Sheet for Patients:  EntrepreneurPulse.com.au  Fact Sheet for Healthcare Providers:  IncredibleEmployment.be  This test is no t yet approved or cleared by the [place] FDA and  has been authorized for detection and/or diagnosis of SARS-CoV-2 by FDA under an Emergency Use Authorization (EUA). This EUA will remain  in effect (meaning this test can be used) for the duration of the COVID-19 declaration under Section 564(b)(1) of the Act, 21 U.S.C.section 360bbb-3(b)(1), unless the authorization is terminated  or revoked sooner.       Influenza A by PCR NEGATIVE NEGATIVE Final   Influenza B by PCR NEGATIVE NEGATIVE Final    Comment: (NOTE) The Xpert Xpress SARS-CoV-2/FLU/RSV plus assay is intended as an aid in the diagnosis of influenza from Nasopharyngeal swab specimens and should not be used as a sole basis for treatment. Nasal washings and aspirates are unacceptable for Xpert Xpress SARS-CoV-2/FLU/RSV testing.  Fact Sheet for Patients: EntrepreneurPulse.com.au  Fact Sheet for Healthcare Providers: IncredibleEmployment.be  This test is not yet approved or cleared by the [place] FDA and has been authorized for detection and/or diagnosis of SARS-CoV-2  by FDA under an Emergency Use Authorization (EUA). This EUA will remain in effect (meaning this test can be used) for the duration of the COVID-19 declaration under Section 564(b)(1) of the Act, 21 U.S.C. section 360bbb-3(b)(1), unless the authorization is terminated or revoked.  Performed at [facility], [place] [address], [place], [place] [address]    Labs: CBC: Recent Labs  Lab 09/13/21 1030 09/14/21 0159 09/15/21 0151 09/16/21 0219 09/17/21 0434  WBC 3.1* 2.7* 2.7* 3.5* 3.3*  NEUTROABS  --   --   --  1.8 1.5*  HGB 7.6* 7.4* 8.7* 8.1* 7.9*  HCT 23.8* 23.5* 27.8* 25.5* 25.5*  MCV 101.3* 100.0 103.0* 102.8* 103.2*  PLT 173 147* 132* 137* 213*   Basic Metabolic Panel: Recent Labs  Lab 09/13/21 1030 09/14/21 0159 09/15/21 0151 09/16/21 0219 09/17/21 0434  NA 140 135 132* 135 133*  K 4.9 3.7 4.1 4.4 4.1  CL 92* 98 95* 96* 96*  CO2 '27 28 29 28 '$ 31  GLUCOSE 111* 110* 99 105* 85  BUN 102* 31* 18 28* 14  CREATININE 19.45* 8.80* 7.61* 10.13* 6.96*  CALCIUM 10.1 9.4 9.3 9.5 8.7*  MG  --  2.2 2.1 2.1 1.9  PHOS  --  5.3* 4.3 4.2 3.7   Liver Function Tests: Recent Labs  Lab 09/14/21 0159 09/15/21 0151 09/16/21 0219 09/17/21 0434  AST  --   --  7* 9*  ALT  --   --  6 7  ALKPHOS  --   --  41 39  BILITOT  --   --  0.3 0.5  PROT  --   --  6.8 6.6  ALBUMIN 2.9* 3.0* 2.9* 2.7*   CBG: Recent Labs  Lab 09/14/21 0609 09/16/21 0816 09/17/21 0828  GLUCAP 89 93 68*   Discharge time spent: greater than 30 minutes.  Signed: Raiford Noble, DO [facility] 09/17/2021

## 2021-09-17 NOTE — Care Management Important Message (Deleted)
Important Message ? ?Patient Details  ?Name: Raymond [name] ?MRN: [id number] ?Date of Birth: [birth year] ? ? ?Medicare Important Message Given:  Yes ? ? ? ? ?Shelda Altes ?09/17/2021, 10:13 AM ?

## 2021-09-18 ENCOUNTER — Telehealth: Payer: Self-pay | Admitting: Nurse Practitioner

## 2021-09-18 NOTE — Telephone Encounter (Signed)
Transition of care contact from inpatient facility ? ?Date of Discharge: 09/17/2021 ?Date of Contact: 09/18/2021 ?Method of contact: Phone ? ?Attempted to contact patient to discuss transition of care from inpatient admission. Patient did not answer the phone. Message was left on the patient's voicemail with call back number [phone].  ?

## 2021-09-29 ENCOUNTER — Other Ambulatory Visit (HOSPITAL_COMMUNITY): Payer: Self-pay

## 2021-10-14 ENCOUNTER — Encounter (HOSPITAL_COMMUNITY): Payer: Self-pay

## 2021-10-14 ENCOUNTER — Other Ambulatory Visit: Payer: Self-pay

## 2021-10-14 ENCOUNTER — Emergency Department (HOSPITAL_COMMUNITY): Payer: 59

## 2021-10-14 ENCOUNTER — Emergency Department (HOSPITAL_COMMUNITY)
Admission: EM | Admit: 2021-10-14 | Discharge: 2021-10-15 | Disposition: A | Discharge status: Home / Self Care | Payer: 59 | Attending: Emergency Medicine | Admitting: Emergency Medicine

## 2021-10-14 DIAGNOSIS — R0602 Shortness of breath: Secondary | ICD-10-CM | POA: Diagnosis present

## 2021-10-14 DIAGNOSIS — Z79899 Other long term (current) drug therapy: Secondary | ICD-10-CM | POA: Insufficient documentation

## 2021-10-14 DIAGNOSIS — F1910 Other psychoactive substance abuse, uncomplicated: Secondary | ICD-10-CM

## 2021-10-14 LAB — BASIC METABOLIC PANEL
Anion gap: 18 — ABNORMAL HIGH (ref 5–15)
BUN: 128 mg/dL — ABNORMAL HIGH (ref 8–23)
CO2: 23 mmol/L (ref 22–32)
Calcium: 9.7 mg/dL (ref 8.9–10.3)
Chloride: 102 mmol/L (ref 98–111)
Creatinine, Ser: 20.71 mg/dL — ABNORMAL HIGH (ref 0.61–1.24)
GFR, Estimated: 2 mL/min — ABNORMAL LOW (ref >60)
Glucose, Bld: 74 mg/dL (ref 70–99)
Potassium: 5.5 mmol/L — ABNORMAL HIGH (ref 3.5–5.1)
Sodium: 143 mmol/L (ref 135–145)

## 2021-10-14 LAB — CBC WITH DIFFERENTIAL/PLATELET
Abs Immature Granulocytes: 0.01 10*3/uL (ref 0.00–0.07)
Basophils Absolute: 0 10*3/uL (ref 0.0–0.1)
Basophils Relative: 0 %
Eosinophils Absolute: 0.1 10*3/uL (ref 0.0–0.5)
Eosinophils Relative: 1 %
HCT: 27.2 % — ABNORMAL LOW (ref 39.0–52.0)
Hemoglobin: 8.4 g/dL — ABNORMAL LOW (ref 13.0–17.0)
Immature Granulocytes: 0 %
Lymphocytes Relative: 27 %
Lymphs Abs: 1.1 10*3/uL (ref 0.7–4.0)
MCH: 32.1 pg (ref 26.0–34.0)
MCHC: 30.9 g/dL (ref 30.0–36.0)
MCV: 103.8 fL — ABNORMAL HIGH (ref 80.0–100.0)
Monocytes Absolute: 0.6 10*3/uL (ref 0.1–1.0)
Monocytes Relative: 15 %
Neutro Abs: 2.2 10*3/uL (ref 1.7–7.7)
Neutrophils Relative %: 57 %
Platelets: 137 10*3/uL — ABNORMAL LOW (ref 150–400)
RBC: 2.62 MIL/uL — ABNORMAL LOW (ref 4.22–5.81)
RDW: 19.7 % — ABNORMAL HIGH (ref 11.5–15.5)
WBC: 3.9 10*3/uL — ABNORMAL LOW (ref 4.0–10.5)
nRBC: 0 % (ref 0.0–0.2)

## 2021-10-14 LAB — BRAIN NATRIURETIC PEPTIDE: B Natriuretic Peptide: 3361.2 pg/mL — ABNORMAL HIGH (ref 0.0–100.0)

## 2021-10-14 IMAGING — CR DG CHEST 2V
2 series · 2 of 2 positions shown · non-contrast
Comparison: None.

CLINICAL DATA: Shortness of breath.

EXAM:
CHEST - 2 VIEW

[chest lat]
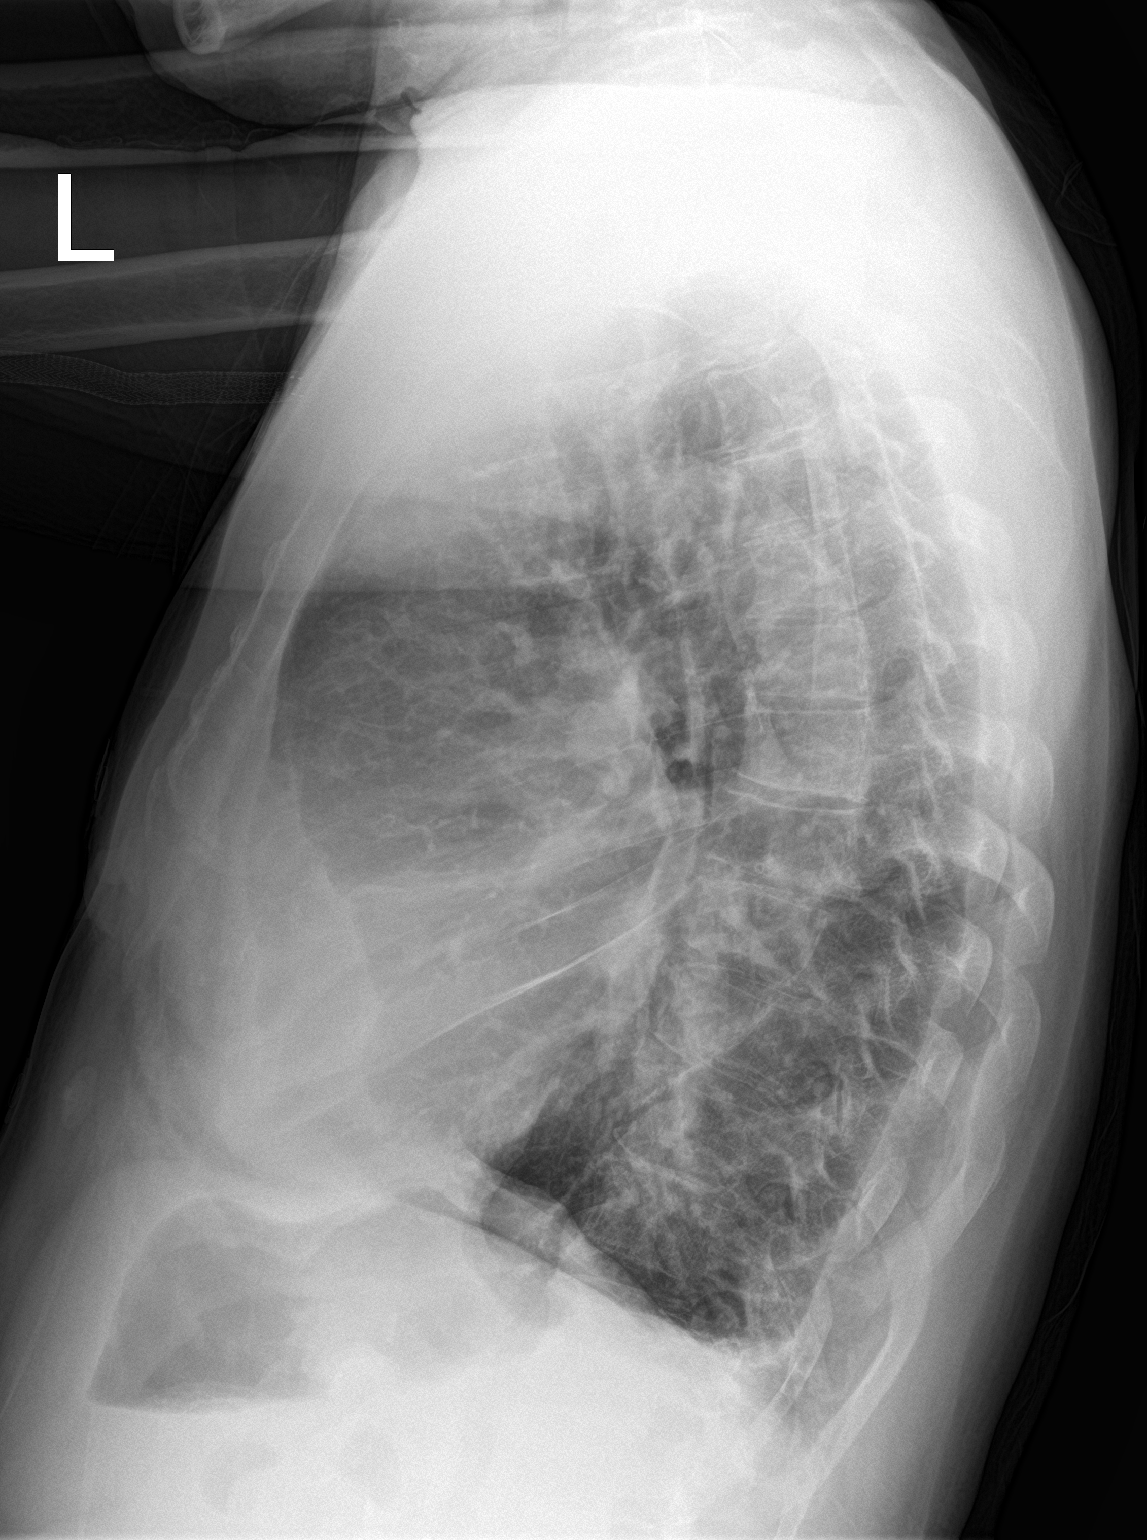

[chest pa]
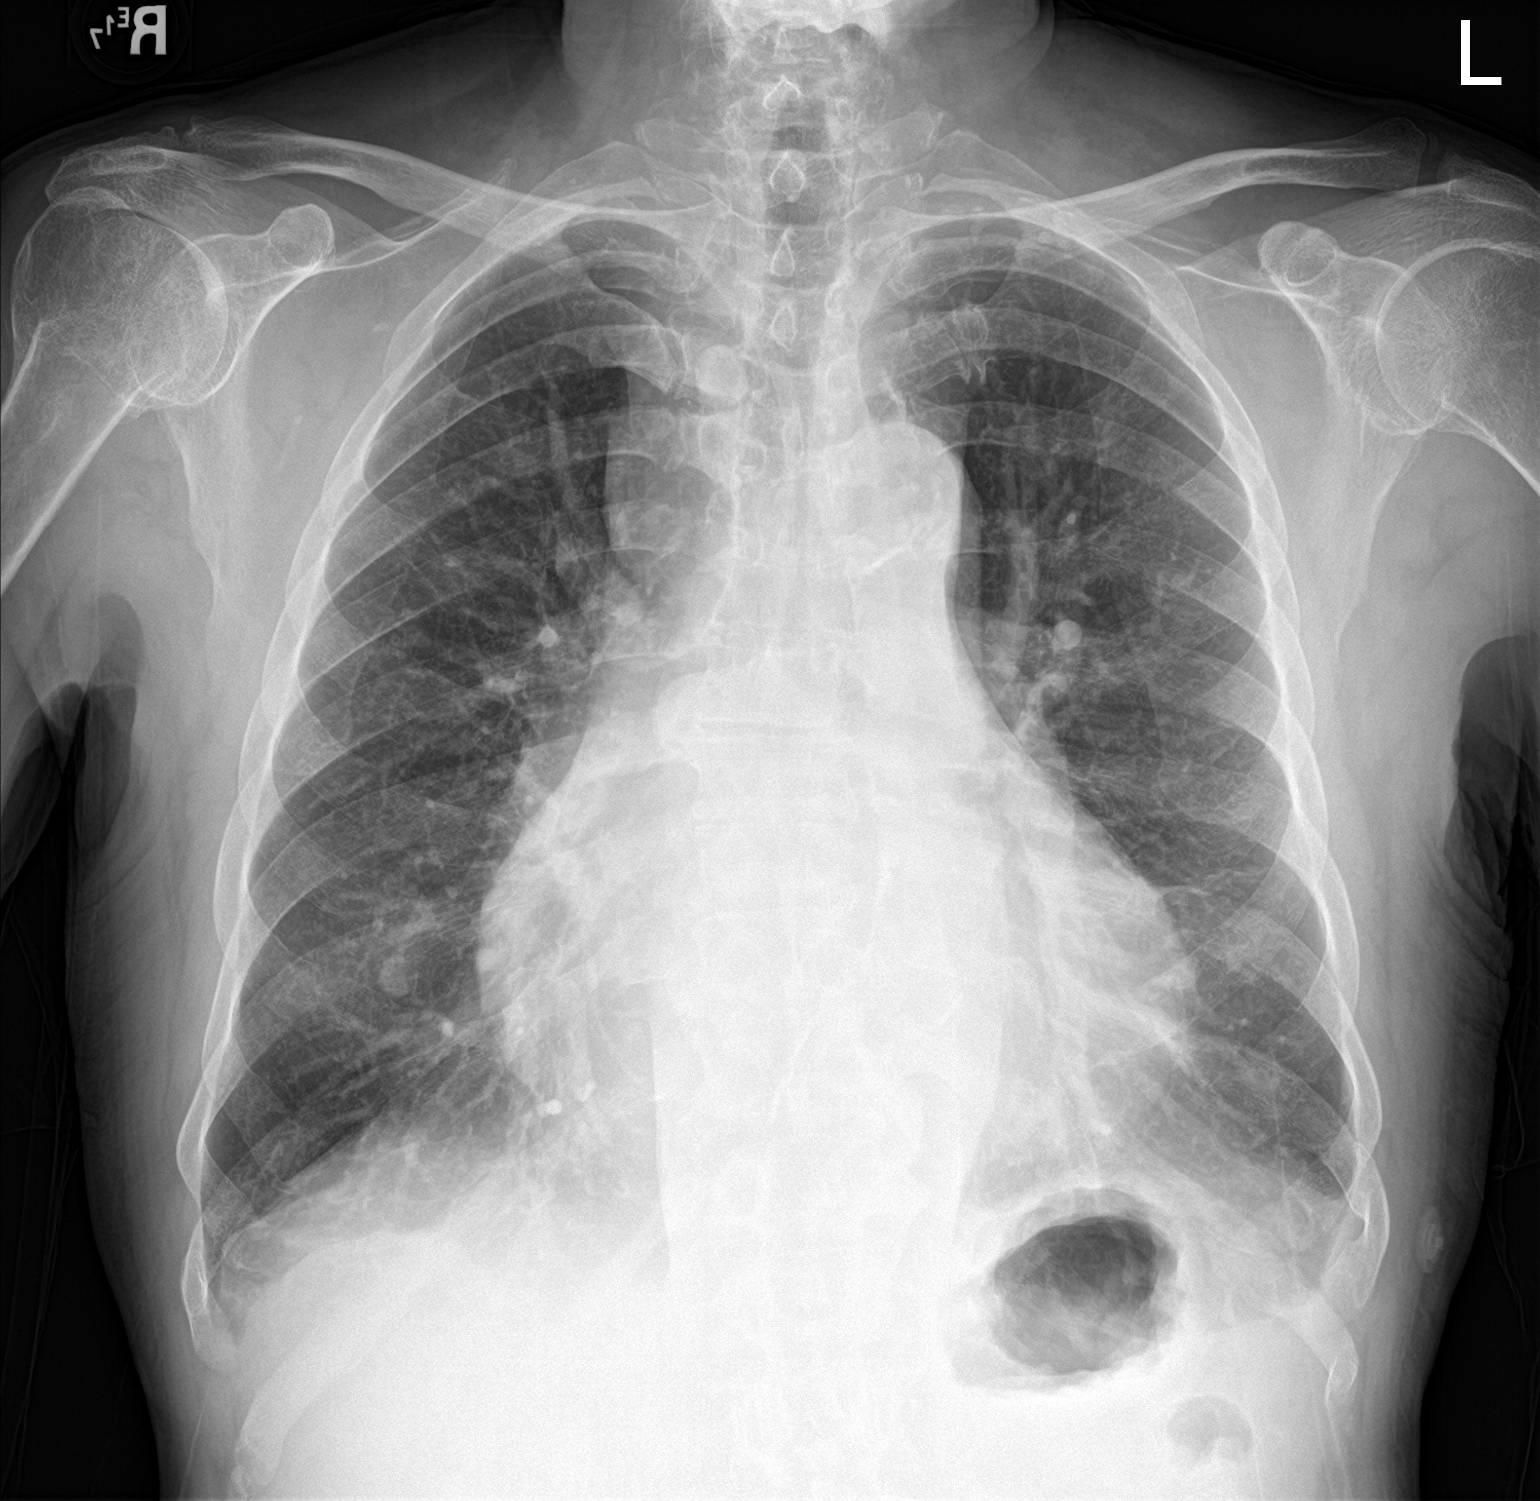

[2 of 2 positions shown; findings below may reference images not displayed]

FINDINGS: Already 8 sorry I gaseous open it free VEPXO thinks the the heart is
enlarged, unchanged. There is no lung consolidation, pleural
effusion or pneumothorax. There is a nodular density in the right
lower lung measuring 9 mm. This was not definitely seen on prior CT.
No acute fractures are identified.
IMPRESSION: 1. No evidence for pneumonia or edema.
2. Stable cardiomegaly.
3. Nodular density projecting over the right lower lung, new from
prior. Findings may represent pulmonary nodule or nipple shadow.
Recommend further evaluation with repeat chest x-ray with nipple
markers versus follow-up nonemergent chest CT.

## 2021-10-14 NOTE — ED Provider Triage Note (Signed)
Emergency Medicine Provider Triage Evaluation Note ? ?Raymond [name] , [name] 66 y.o. male  was evaluated in triage.  Pt complains of shortness of breath and leg swelling.  He notes that he has missed his last 3 dialysis appointment and he typically goes on Tuesday, Thursday, Saturday.  Patient also uses crack cocaine with his last use being 2 days ago.  Denies chest pain, nausea, vomiting, abdominal pain. ? ?Review of Systems  ?Positive: As per HPI above ?Negative:  ? ?Physical Exam  ?BP (!) 188/116 (BP Location: Right Arm)   Pulse 89   Temp 99 ?F (37.2 ?C) (Oral)   Resp 18   SpO2 100%  ?Gen:   Awake, no distress   ?Resp:  Normal effort, diminished breath sounds noted throughout lungs ?MSK:   Moves extremities without difficulty ?Other:  1+ pitting edema noted to bilateral lower extremities. ? ?Medical Decision Making  ?Medically screening exam initiated at 5:57 PM.  Appropriate orders placed.  Raymond [name] was informed that the remainder of the evaluation will be completed by another provider, this initial triage assessment does not replace that evaluation, and the importance of remaining in the ED until their evaluation is complete. ? ?  ?Raymond Lynam A, PA-C ?10/14/21 1802 ? ?

## 2021-10-14 NOTE — ED Triage Notes (Signed)
Pt reports addiction to crack cocaine, last used two days ago. He is requesting help for his drug problem. He has no where to stay and reports he has missed his last 3 dialysis appointments. He reports SOB and leg swelling.  ?

## 2021-10-15 DIAGNOSIS — R0602 Shortness of breath: Secondary | ICD-10-CM | POA: Diagnosis not present

## 2021-10-15 LAB — HEPATITIS B SURFACE ANTIBODY,QUALITATIVE: Hep B S Ab: REACTIVE — AB

## 2021-10-15 LAB — HEPATITIS B SURFACE ANTIGEN: Hepatitis B Surface Ag: NONREACTIVE

## 2021-10-15 MED ORDER — CHLORHEXIDINE GLUCONATE CLOTH 2 % EX PADS: 6.0000 | MEDICATED_PAD | Freq: Every day | CUTANEOUS | Status: DC

## 2021-10-15 NOTE — Progress Notes (Addendum)
Transition of Care [facility]) - Emergency Department Mini Assessment ? ? ?Patient Details  ?Name: Raymond [name] ?MRN: [id number] ?Date of Birth: 08-Oct-1955 ? ?Transition of Care (TOC) CM/SW Contact:    ?Fuller Mandril, RN ?Phone Number: [phone] ?10/15/2021, 9:11 AM ? ? ?Clinical Narrative: ?RNCM met with pt at bedside to discuss disposition plan.  Pt states that he has missed several dialysis appointment due to not having a stable place to recover/rest after dialysis.  Pt is aware of transportation options but states that he has to have a stable place for pick-up and drop off and he doesn't have that right now.  ? ?RNCM reviewed case with Aron Baba, SW (Vulnerable Population) who will check with city resources regarding pallet homes and alternatives. ? ?RNCM consulted Renal Navigator Olivia Mackie) who was unable to connect with Dialysis Center Social Worker to confirm type of transportation previously used by pt.  Olivia Mackie suggests pt utilize [facility]) as pick-up drop-off location until stable housing established. ?   ? ? ?ED Mini Assessment: ?What brought you to the Emergency Department? : (P) "I've missed a couple of dialysis appointments" ? ?Barriers to Discharge: (P) Transportation, Homeless with medical needs, Barriers Resolved ? ?Barrier interventions: (P) Provided transportation resource and made follow-up appointment ? ?Means of departure: (P) Public Transportation ? ?Interventions which prevented an admission or readmission: (P) Follow-up medical appointment, Transportation Screening ? ? ? ?Patient Contact and Communications ?  ?  ?  ? ,     ?  ?  ? ?Patient states their goals for this hospitalization and ongoing recovery are:: (P) get a place to stay ?  ?  ? ?Admission diagnosis:  missed three dialysis appointments ?Patient Active Problem List  ? Diagnosis Date Noted  ? Thrombocytopenia ([place]) 09/15/2021  ? Hypertensive urgency 09/13/2021  ? Uncontrolled hypertension   ? Uremia   ?  Severe episode of recurrent major depressive disorder, without psychotic features ([place])   ? ESRD (end stage renal disease) on dialysis ([place]) 05/18/2021  ? Cardiac arrest ([place]) 04/03/2021  ? Anisocoria 04/03/2021  ? HFrEF (heart failure with reduced ejection fraction) ([place]) 04/03/2021  ? Hypothermia not due to cold exposure 04/03/2021  ? Junctional bradycardia 04/03/2021  ? Pancytopenia ([place]) 01/25/2021  ? AMS (altered mental status) 01/19/2021  ? Chronic viral hepatitis C ([place]) 12/25/2020  ? 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 ([place]) 08/05/2020  ? Acute upper GI bleed 07/29/2020  ? Acute metabolic encephalopathy 87/68/1157  ? COVID-19 virus infection 07/29/2020  ? AVM (arteriovenous malformation) of stomach, acquired with hemorrhage   ? AVM (arteriovenous malformation) of small bowel, acquired with hemorrhage   ? Current use of long term anticoagulation   ? Sensorineural hearing loss (SNHL) of both ears 03/30/2020  ? History of arteriovenous malformation (AVM) 01/14/2020  ? UGIB (upper gastrointestinal bleed) 01/14/2020  ? Acute blood loss anemia 01/14/2020  ? Elevated troponin 01/14/2020  ? Allergy, unspecified, initial encounter 04/29/2019  ? Gastroenteritis 12/24/2018  ? Substance use disorder 12/24/2018  ? Type 2 diabetes mellitus with other diabetic kidney complication ([place]cad[place]) 26/20/3559  ? Noncompliance of patient with renal dialysis 09/12/2018  ? Benign neoplasm of colon   ? Melena   ? Anemia due to chronic kidney disease, on chronic dialysis ([place])   ? Anemia   ? H/O medication noncompliance   ? Polysubstance (excluding opioids) dependence ([place])   ? Atrial flutter with rapid ventricular response ([place]) 08/10/2018  ? Chest pain 08/10/2018  ?  PAF (paroxysmal atrial fibrillation) ([place]) 08/01/2018  ? Acute respiratory failure with hypoxia ([place]) 07/13/2018  ? Non-compliance with renal dialysis 07/13/2018  ? PAD (peripheral artery disease) ([place]) 04/11/2018  ? Sepsis ([place]) 03/31/2018  ? Preop  cardiovascular exam 03/29/2018  ? Idiopathic gout, unspecified site 12/13/2017  ? Other specified complication of vascular prosthetic devices, implants and grafts, initial encounter ([place]) 11/03/2017  ? Volume overload 08/07/2017  ? Acute pulmonary edema (HCC)   ? Dyspnea 06/20/2017  ? Acute bronchitis   ? COPD (chronic obstructive pulmonary disease) ([employer])   ? ESRD on hemodialysis ([place]od[place])   ? Hypoxia 12/05/2016  ? Hyperkalemia 12/19/2015  ? Hypoglycemia 12/19/2015  ? Polysubstance abuse ([place]rom[place]) 12/19/2015  ? Homeless 12/19/2015  ? Anemia associated with stage 5 chronic renal failure ([place]) 12/19/2015  ? BPH without urinary obstruction 10/29/2015  ? Erectile dysfunction 10/29/2015  ? Hematuria, gross 10/29/2015  ? Hypercalcemia 11/07/2014  ? Pruritus, unspecified 10/17/2014  ? Coagulation defect, unspecified ([place]) 09/17/2014  ? Atypical chest pain 12/19/2013  ? Thrombocytopenia, unspecified ([place]) 10/17/2013  ? Iron deficiency anemia, unspecified 08/28/2013  ? Atherosclerosis of native arteries of extremity with intermittent claudication ([place]) 12/04/2012  ? ESRD (end stage renal disease) ([place]) 12/04/2012  ? Hypertensive chronic kidney disease with stage 5 chronic kidney disease or end stage renal disease ([place]) 11/20/2012  ? Secondary hyperparathyroidism of renal origin ([place]) 11/20/2012  ? HEPATITIS C 09/07/2006  ? HYPERCHOLESTEROLEMIA 09/07/2006  ? HYPERTENSION, BENIGN SYSTEMIC 09/07/2006  ? ?PCP:  [name], FNP ?Pharmacy:   ?[facility] ?[address] ?[place] [address] ?Phone: [phone] Fax: [phone] ? ? ?

## 2021-10-15 NOTE — Discharge Instructions (Signed)
Substance Abuse Treatment Programs ° °Intensive Outpatient Programs °[facility]     °[address]      °[place], [place]                   °[phone]      ° °[facility] °[address] °[place], [place] °[phone] ° °[facility]     °(Inpatient and outpatient)     °[address]           °[phone]   ° °[facility] °[phone] (Suboxone and Methadone) ° °[address]      °[place], [place] [address]      °[phone]      ° °[address] °[place], [place] °[phone] ° °[facility] (Outpatient/Inpatient, Chemical)    °(insurance only) [phone]      °       °[facility]Groups & Residential) °[place], [place] °[phone] ° °   °[facility]     °[address]     °[place], [place]      °[phone]      ° °[facility] (for caregivers and family) °[address] °[place], [place] °[phone] ° ° ° ° ° °Residential Treatment Programs °[facility]      °[address], [place] , [place] [address]  °[phone]      ° °[facility] °[address] [place], [place] [address] °[phone] ° °[facility]        °[phone]      ° °[facility] °[phone] ° °[facility]             °[address]                                         °[place], [place]                                                °[phone] or 336-784-9470                              ° °[facility] [place] °[address] °[place], [address] °[phone] ° °[facility]    °[address]      °[place], [place]     °[phone]      ° °[facility] °[address] °[place], [place] °[phone] ° °[facility]   °[address]     °[place], [place] [address]     °[phone]      °Admissions: 8am-3pm M-F ° °[facility] °[address] °[place] °[phone] ° °[facility]: Residential Program (90 Days)   °[place], [place]      °336-725-8389 or [phone]    ° °[facility] °[place], [place] °(Walk in Hours over the weekend or by referral) ° °[facility] °[address], [place], [place] [address] °([phone] ° °Crisis Mobile: [facility]:  1-877-626-1772 (for crisis response 24 hours a day) °[facility] Hotline:      1-800-256-2452 °Outpatient Psychiatry and Counseling ° °[facility]: Mobile Crisis   Management 24 hours:  1-877-626-1772 ° °[facility] sliding scale fee and walk in schedule: M-F 8am-12pm/1pm-3pm °[address]  °[place], [place] [address] °[phone] ° °[facility] °[address] °[place], [place] [address] °[phone] ° °[facility] ([facility] known [facility])- new patient walk-in appointments available Monday - Friday 8am -3pm.          °[address] °[place], [place] [address] °336-676-6840 or crisis line- 336-676-6905 ° °[facility]ensive [facility] Therapy [facility] °[address] °[place], [place] [address] °[phone] ° °[facility]                  °Crisis Services      °[phone]      °[address]     °[place], [place] [address]                ° °[facility]   °[facility] °[phone] °[address] °[place], [place] [address] ° ° °[facility]          °[address],  °[place], [place] [address]       °([phone] ° °[facility] °[address] °[place], [place] [address] °336-292-1510 ° °[facility]    °[address]    °[place], [place] [address]     °[phone]      ° °Parish McKinney, MD     °[address]     °[place] [address]     °[phone]     °  °[facility] °[address] °[place] [address] ° °[facility]     °[address]     °[place], [place]      °336-542-2076      ° °[facility] °Shamsher Ahluwalia, MD °[address] °[place], [place] [address] °[phone] ° °[facility]     °[address]     °[place], [place] [address]     °336-274-1237      ° °[facility] °[address] °[place], [place] [address] °336-854-4450 °Resources for Temporary Residential Assistance/Crisis Centers ° °DAY CENTERS °[facility] °M-F 8am-3pm   °[address] [place], [place] [address]   336-332-0824 °Services include: laundry, barbering, support groups, case management, phone  & computer access, showers, AA/NA mtgs, mental health/substance abuse nurse, job skills class, disability information, VA assistance, spiritual classes, etc.  ° °HOMELESS SHELTERS ° °[facility]     °[facility]   °[address], [place]     °[phone]       °       °[facility] (women and children)       °[address] °[place], [place] [address] °336-275-0820 °[email or web address] for application and process °Application Required ° °[facility]   °[address]    °[place] [address]     °[phone]       °             °[facility] °[address] °[place], [place] [address] °336.273.5572 °336-235-0363(schedule application appt.) °Application Required ° °[facility] (women only)    °[address]     °[place],  [address]     °336-884-1039      °  Intake starts 6pm daily °Need valid ID, SSC, & Police report °[facility] [place] °[address] °[place], [place] °336-881-5420 °Application Required ° °[facility] (men only)     °[address]      °[place], [place]     °[phone]      ° °[facility] °(Pregnant women only) °[address] °[place], [place] °336-275-0206 ° °[facility]      °[address]      °[place], [place] [address]     °[phone]      °       °[facility] °[address] °[place], [place] °336-723-1848 °90 day commitment/SA/Application process ° °[facility](men only)     °[address]     °[place], [place]     °[phone]       °Check-in at 7pm     °       °[facility] °[address] °[place], [place] [address] °336-248-6684 °Men/Women/Women and Children must be there by 7 pm ° °[facility] °[place], [place] °[phone]                ° °

## 2021-10-15 NOTE — Procedures (Signed)
Asked to see patient for HD. Does not require admission, but has missed 1 week of HD and here w/ SOB. Exam wnl, pt not in distress. Recommend ED HD (does HD then dc back to ED who reassesses prior to dc home).   ? ?TTS NW ? 4.5h   450/ 800  113.3kg  2/2 bath  P2  LUA AVF Hep 6000 bolus + 3058mdrun ? - rocal 2.25 ug tiw ?   ? ?I was present at this dialysis session, have reviewed the session itself and made  appropriate changes ?RKelly SplinterMD ?[employer]?pager [phone]  ?10/15/2021, 4:35 PM ? ? ?

## 2021-10-15 NOTE — ED Provider Notes (Signed)
?[facility] ?Provider Note ? ? ?CSN: [id number] ?Arrival date & time: 10/14/21  1709 ? ?  ? ?History ? ?Chief Complaint  ?Patient presents with  ? Drug Problem  ? Shortness of Breath  ? ? ?Raymond [name] is a 66 y.o. male. ? ? ?Drug Problem ?Associated symptoms include shortness of breath. Pertinent negatives include no abdominal pain.  ?Shortness of Breath ?Severity:  Mild ?Duration:  3 days ?Timing:  Constant ?Progression:  Worsening ?Chronicity:  Recurrent ?Context: not activity   ?Context comment:  Missed dialysis ?Relieved by:  None tried ?Worsened by:  Nothing ?Ineffective treatments:  None tried ?Associated symptoms: no abdominal pain   ? ?  ? ?Home Medications ?Prior to Admission medications   ?Medication Sig Start Date End Date Taking? Authorizing Provider  ?acetaminophen (TYLENOL) 325 MG tablet Take 2 tablets (650 mg total) by mouth every 6 (six) hours as needed for mild pain (or Fever >/= 101). 09/17/21   Raiford Noble Latif, DO  ?albuterol (VENTOLIN HFA) 108 (90 Base) MCG/ACT inhaler Inhale 2 puffs into the lungs every 4 (four) hours as needed for shortness of breath or wheezing. ?Patient not taking: Reported on 09/13/2021 11/06/19   [provider]  ?amiodarone (PACERONE) 200 MG tablet Take 200 mg by mouth daily. 06/17/21   [provider]  ?amLODipine (NORVASC) 5 MG tablet Take 5 mg by mouth at bedtime. 07/22/21   [provider]  ?calcitRIOL (ROCALTROL) 0.5 MCG capsule Take 4 capsules (2 mcg total) by mouth Every Tuesday,Thursday,and Saturday with dialysis. 05/27/21   France Ravens, MD  ?carvedilol (COREG) 12.5 MG tablet Take 12.5 mg by mouth 2 (two) times daily. 07/06/21   [provider]  ?cinacalcet (SENSIPAR) 30 MG tablet Take 6 tablets (180 mg total) by mouth Every Tuesday,Thursday,and Saturday with dialysis. 12/12/20   Mercy Riding, MD  ?Darbepoetin Alfa (ARANESP) 200 MCG/0.4ML SOSY injection Inject 0.4 mLs (200 mcg total) into the  vein every Tuesday with hemodialysis. 05/25/21   France Ravens, MD  ?folic acid (FOLVITE) 1 MG tablet Take 1 tablet (1 mg total) by mouth daily. 09/17/21   Raiford Noble Latif, DO  ?mometasone-formoterol (DULERA) 200-5 MCG/ACT AERO Inhale 2 puffs into the lungs 2 (two) times daily. 09/17/21   Kerney Elbe, DO  ?multivitamin (RENA-VIT) TABS tablet Take 1 tablet by mouth at bedtime. 09/17/21   Raiford Noble Latif, DO  ?nicotine (NICODERM CQ - DOSED IN MG/24 HOURS) 14 mg/24hr patch Place 1 patch (14 mg total) onto the skin daily. 09/17/21   Raiford Noble Latif, DO  ?pantoprazole (PROTONIX) 40 MG tablet Take 1 tablet (40 mg total) by mouth 2 (two) times daily before a meal. 09/17/21   Sheikh, Georgina Quint Latif, DO  ?polyethylene glycol powder (GLYCOLAX/MIRALAX) 17 GM/SCOOP powder Take 17 g by mouth daily as needed for mild constipation. 09/17/21   Raiford Noble Latif, DO  ?rosuvastatin (CRESTOR) 10 MG tablet Take 1 tablet (10 mg total) by mouth daily. 12/11/20 09/13/21  Mercy Riding, MD  ?sevelamer carbonate (RENVELA) 800 MG tablet Take 5 tablets (4,000 mg total) by mouth 3 (three) times daily with meals. 09/17/21   Raiford Noble Latif, DO  ?thiamine 100 MG tablet Take 1 tablet (100 mg total) by mouth daily. 09/17/21   Raiford Noble Latif, DO  ?umeclidinium bromide (INCRUSE ELLIPTA) 62.5 MCG/ACT AEPB Inhale 1 puff into the lungs daily. 09/17/21   Raiford Noble Latif, DO  ?apixaban (ELIQUIS) 5 MG TABS tablet Take 1 tablet (  5 mg total) by mouth 2 (two) times daily. 06/22/20 08/03/20  Karoline Caldwell, PA-C  ?   ? ?Allergies    ?Patient has no known allergies.   ? ?Review of Systems   ?Review of Systems  ?Respiratory:  Positive for shortness of breath.   ?Gastrointestinal:  Negative for abdominal pain.  ? ?Physical Exam ?Updated Vital Signs ?BP (!) 190/107 (BP Location: Right Arm)   Pulse 68   Temp 98.3 ?F (36.8 ?C) (Oral)   Resp 20   SpO2 100%  ?Physical Exam ?Vitals and nursing note reviewed.  ?Constitutional:   ?   Appearance: He  is well-developed.  ?HENT:  ?   Head: Normocephalic and atraumatic.  ?Cardiovascular:  ?   Rate and Rhythm: Normal rate.  ?Pulmonary:  ?   Effort: Pulmonary effort is normal. No respiratory distress.  ?   Breath sounds: No decreased breath sounds or rales.  ?Abdominal:  ?   General: There is no distension.  ?Musculoskeletal:     ?   General: Normal range of motion.  ?   Cervical back: Normal range of motion.  ?Skin: ?   General: Skin is warm and dry.  ?Neurological:  ?   General: No focal deficit present.  ?   Mental Status: He is alert.  ? ? ?ED Results / Procedures / Treatments   ?Labs ?(all labs ordered are listed, but only abnormal results are displayed) ?Labs Reviewed  ?BASIC METABOLIC PANEL - Abnormal; Notable for the following components:  ?    Result Value  ? Potassium 5.5 (*)   ? BUN 128 (*)   ? Creatinine, Ser 20.71 (*)   ? GFR, Estimated 2 (*)   ? Anion gap 18 (*)   ? All other components within normal limits  ?CBC WITH DIFFERENTIAL/PLATELET - Abnormal; Notable for the following components:  ? WBC 3.9 (*)   ? RBC 2.62 (*)   ? Hemoglobin 8.4 (*)   ? HCT 27.2 (*)   ? MCV 103.8 (*)   ? RDW 19.7 (*)   ? Platelets 137 (*)   ? All other components within normal limits  ?BRAIN NATRIURETIC PEPTIDE - Abnormal; Notable for the following components:  ? B Natriuretic Peptide 3,361.2 (*)   ? All other components within normal limits  ? ? ?EKG ?None ? ?Radiology ?DG Chest 2 View ? ?Result Date: 10/14/2021 ?CLINICAL DATA:  Shortness of breath. EXAM: CHEST - 2 VIEW COMPARISON:  None. FINDINGS: Already 8 sorry I gaseous open it free Danae Chen thinks the the heart is enlarged, unchanged. There is no lung consolidation, pleural effusion or pneumothorax. There is a nodular density in the right lower lung measuring 9 mm. This was not definitely seen on prior CT. No acute fractures are identified. IMPRESSION: 1. No evidence for pneumonia or edema. 2. Stable cardiomegaly. 3. Nodular density projecting over the right lower lung, new  from prior. Findings may represent pulmonary nodule or nipple shadow. Recommend further evaluation with repeat chest x-ray with nipple markers versus follow-up nonemergent chest CT. Electronically Signed   By: Ronney Asters M.D.   On: 10/14/2021 19:15   ? ?Procedures ?Procedures  ? ? ?Medications Ordered in ED ?Medications  ?Chlorhexidine Gluconate Cloth 2 % PADS 6 each (has no administration in time range)  ? ? ?ED Course/ Medical Decision Making/ A&P ?  ?                        ?  Medical Decision Making ? ?Patient not in any distress however does have a BUN of 128 and potassium of 5.5 without a way to get dialysis on his own tomorrow.  Discussed with Dr. Henrene Pastor with nephrology.  We will plan for dialysis later in the morning.  Patient is in no distress at this time I feel he can be discharged after dialysis.  We will engage TOC to see if they can help with getting rides to dialysis as the patient is now homeless and no longer in skilled nursing but no need to keep him in the ED or hospital for these reasons.  ? ? ?Final Clinical Impression(s) / ED Diagnoses ?Final diagnoses:  ?None  ? ? ?Rx / DC Orders ?ED Discharge Orders   ? ? None  ? ?  ? ? ?  ?Merrily Pew, MD ?10/15/21 [phone] ? ?

## 2021-10-15 NOTE — Progress Notes (Signed)
Contacted by ED case manger regarding pt's claim of need for transportation to out-pt HD. Out-pt clinic CSW unavailable today due to holiday. Contacted medicaid transportation to see if pt has been approved for services. Medicaid transportation closed today for holiday. Contacted [facility] [place] who claims that pt is inactive at this time.Spoke to case manger to provide above details. Pt informed case manager that pt his homeless and does not have an address for transportation to pick pt up at. Informed case manager that some pt's use the [facility] as address for pick-up/drop-off for transportation. Case manager to provide this info and medicaid transport number to pt.  ? ?Melven Sartorius ?Renal Navigator ?[phone]   ?

## 2021-10-15 NOTE — Progress Notes (Signed)
removed 3555ms net fluid no complaints no complications tolerated tx well.  pre bp 197/107 post bp 180/91 no weights taken patient wouldn't stand and none on stretcher.2 bandages to lua avf no bleeding dressing cdi. ?

## 2021-10-15 NOTE — ED Provider Notes (Signed)
Blood pressure (!) 186/93, pulse 85, temperature 99.7 ?F (37.6 ?C), temperature source Oral, resp. rate 15, SpO2 100 %. ? ?In short, Raymond [name] is a 66 y.o. male with a chief complaint of Drug Problem and Shortness of Breath ?[name]  Refer to the original H&P for additional details. ? ?07:02 PM  ?Patient returned from dialysis.  He is eating and drinking.  He is looking well and feeling better.  Plan for discharge with community resources. ? ?  ?Margette Fast, MD ?10/15/21 1902 ? ?

## 2021-10-16 LAB — HEPATITIS B SURFACE ANTIBODY, QUANTITATIVE: Hep B S AB Quant (Post): >1000 m[IU]/mL (ref >9.9)

## 2021-11-11 ENCOUNTER — Other Ambulatory Visit: Payer: Self-pay

## 2021-11-11 ENCOUNTER — Inpatient Hospital Stay (HOSPITAL_COMMUNITY)
Admission: EM | Admit: 2021-11-11 | Discharge: 2021-12-01 | DRG: 291 | Disposition: A | Discharge status: Home / Self Care | Payer: 59 | Attending: Family Medicine | Admitting: Family Medicine

## 2021-11-11 ENCOUNTER — Encounter (HOSPITAL_COMMUNITY): Payer: Self-pay | Admitting: Emergency Medicine

## 2021-11-11 ENCOUNTER — Emergency Department (HOSPITAL_COMMUNITY): Payer: 59

## 2021-11-11 ENCOUNTER — Encounter (HOSPITAL_COMMUNITY): Payer: Self-pay

## 2021-11-11 ENCOUNTER — Ambulatory Visit (HOSPITAL_COMMUNITY)
Admission: EM | Admit: 2021-11-11 | Discharge: 2021-11-11 | Disposition: A | Discharge status: Home / Self Care | Payer: 59 | Attending: Family Medicine | Admitting: Family Medicine

## 2021-11-11 DIAGNOSIS — E871 Hypo-osmolality and hyponatremia: Secondary | ICD-10-CM | POA: Diagnosis present

## 2021-11-11 DIAGNOSIS — D631 Anemia in chronic kidney disease: Secondary | ICD-10-CM | POA: Diagnosis present

## 2021-11-11 DIAGNOSIS — R109 Unspecified abdominal pain: Secondary | ICD-10-CM | POA: Diagnosis not present

## 2021-11-11 DIAGNOSIS — I10 Essential (primary) hypertension: Secondary | ICD-10-CM

## 2021-11-11 DIAGNOSIS — R7989 Other specified abnormal findings of blood chemistry: Secondary | ICD-10-CM | POA: Diagnosis present

## 2021-11-11 DIAGNOSIS — Z992 Dependence on renal dialysis: Secondary | ICD-10-CM

## 2021-11-11 DIAGNOSIS — I509 Heart failure, unspecified: Secondary | ICD-10-CM

## 2021-11-11 DIAGNOSIS — F1910 Other psychoactive substance abuse, uncomplicated: Secondary | ICD-10-CM

## 2021-11-11 DIAGNOSIS — E785 Hyperlipidemia, unspecified: Secondary | ICD-10-CM | POA: Diagnosis present

## 2021-11-11 DIAGNOSIS — J449 Chronic obstructive pulmonary disease, unspecified: Secondary | ICD-10-CM | POA: Diagnosis present

## 2021-11-11 DIAGNOSIS — F1410 Cocaine abuse, uncomplicated: Secondary | ICD-10-CM | POA: Diagnosis present

## 2021-11-11 DIAGNOSIS — N2581 Secondary hyperparathyroidism of renal origin: Secondary | ICD-10-CM | POA: Diagnosis present

## 2021-11-11 DIAGNOSIS — E8889 Other specified metabolic disorders: Secondary | ICD-10-CM | POA: Diagnosis present

## 2021-11-11 DIAGNOSIS — R531 Weakness: Secondary | ICD-10-CM

## 2021-11-11 DIAGNOSIS — D72819 Decreased white blood cell count, unspecified: Secondary | ICD-10-CM | POA: Diagnosis present

## 2021-11-11 DIAGNOSIS — G9341 Metabolic encephalopathy: Secondary | ICD-10-CM | POA: Diagnosis present

## 2021-11-11 DIAGNOSIS — N186 End stage renal disease: Secondary | ICD-10-CM

## 2021-11-11 DIAGNOSIS — I480 Paroxysmal atrial fibrillation: Secondary | ICD-10-CM

## 2021-11-11 DIAGNOSIS — R040 Epistaxis: Secondary | ICD-10-CM | POA: Diagnosis not present

## 2021-11-11 DIAGNOSIS — G9349 Other encephalopathy: Secondary | ICD-10-CM | POA: Diagnosis present

## 2021-11-11 DIAGNOSIS — Z7951 Long term (current) use of inhaled steroids: Secondary | ICD-10-CM

## 2021-11-11 DIAGNOSIS — F17210 Nicotine dependence, cigarettes, uncomplicated: Secondary | ICD-10-CM | POA: Diagnosis present

## 2021-11-11 DIAGNOSIS — Z833 Family history of diabetes mellitus: Secondary | ICD-10-CM

## 2021-11-11 DIAGNOSIS — I132 Hypertensive heart and chronic kidney disease with heart failure and with stage 5 chronic kidney disease, or end stage renal disease: Principal | ICD-10-CM | POA: Diagnosis present

## 2021-11-11 DIAGNOSIS — Z8249 Family history of ischemic heart disease and other diseases of the circulatory system: Secondary | ICD-10-CM

## 2021-11-11 DIAGNOSIS — D696 Thrombocytopenia, unspecified: Secondary | ICD-10-CM | POA: Diagnosis present

## 2021-11-11 DIAGNOSIS — E1122 Type 2 diabetes mellitus with diabetic chronic kidney disease: Secondary | ICD-10-CM | POA: Diagnosis present

## 2021-11-11 DIAGNOSIS — J810 Acute pulmonary edema: Secondary | ICD-10-CM | POA: Diagnosis not present

## 2021-11-11 DIAGNOSIS — R0602 Shortness of breath: Secondary | ICD-10-CM | POA: Diagnosis not present

## 2021-11-11 DIAGNOSIS — N19 Unspecified kidney failure: Principal | ICD-10-CM

## 2021-11-11 DIAGNOSIS — I5043 Acute on chronic combined systolic (congestive) and diastolic (congestive) heart failure: Secondary | ICD-10-CM | POA: Diagnosis present

## 2021-11-11 DIAGNOSIS — R079 Chest pain, unspecified: Secondary | ICD-10-CM | POA: Diagnosis present

## 2021-11-11 DIAGNOSIS — I161 Hypertensive emergency: Secondary | ICD-10-CM | POA: Diagnosis present

## 2021-11-11 DIAGNOSIS — I2510 Atherosclerotic heart disease of native coronary artery without angina pectoris: Secondary | ICD-10-CM | POA: Diagnosis present

## 2021-11-11 DIAGNOSIS — E875 Hyperkalemia: Secondary | ICD-10-CM | POA: Diagnosis present

## 2021-11-11 DIAGNOSIS — R0789 Other chest pain: Secondary | ICD-10-CM

## 2021-11-11 DIAGNOSIS — Z8719 Personal history of other diseases of the digestive system: Secondary | ICD-10-CM

## 2021-11-11 DIAGNOSIS — E1151 Type 2 diabetes mellitus with diabetic peripheral angiopathy without gangrene: Secondary | ICD-10-CM | POA: Diagnosis present

## 2021-11-11 DIAGNOSIS — Z7901 Long term (current) use of anticoagulants: Secondary | ICD-10-CM

## 2021-11-11 DIAGNOSIS — D61818 Other pancytopenia: Secondary | ICD-10-CM | POA: Diagnosis present

## 2021-11-11 DIAGNOSIS — Z79899 Other long term (current) drug therapy: Secondary | ICD-10-CM

## 2021-11-11 DIAGNOSIS — M79662 Pain in left lower leg: Secondary | ICD-10-CM | POA: Diagnosis present

## 2021-11-11 DIAGNOSIS — Z91148 Patient's other noncompliance with medication regimen for other reason: Secondary | ICD-10-CM

## 2021-11-11 DIAGNOSIS — Z5900 Homelessness unspecified: Secondary | ICD-10-CM

## 2021-11-11 DIAGNOSIS — M79661 Pain in right lower leg: Secondary | ICD-10-CM | POA: Diagnosis present

## 2021-11-11 DIAGNOSIS — M7989 Other specified soft tissue disorders: Secondary | ICD-10-CM | POA: Diagnosis not present

## 2021-11-11 DIAGNOSIS — I5033 Acute on chronic diastolic (congestive) heart failure: Secondary | ICD-10-CM | POA: Diagnosis not present

## 2021-11-11 DIAGNOSIS — B182 Chronic viral hepatitis C: Secondary | ICD-10-CM | POA: Diagnosis present

## 2021-11-11 DIAGNOSIS — I5020 Unspecified systolic (congestive) heart failure: Secondary | ICD-10-CM | POA: Diagnosis present

## 2021-11-11 DIAGNOSIS — H109 Unspecified conjunctivitis: Secondary | ICD-10-CM | POA: Diagnosis not present

## 2021-11-11 DIAGNOSIS — Z91158 Patient's noncompliance with renal dialysis for other reason: Secondary | ICD-10-CM

## 2021-11-11 DIAGNOSIS — G4700 Insomnia, unspecified: Secondary | ICD-10-CM | POA: Diagnosis present

## 2021-11-11 LAB — COMPREHENSIVE METABOLIC PANEL
ALT: 7 U/L (ref 0–44)
AST: <5 U/L — ABNORMAL LOW (ref 15–41)
Albumin: 2.8 g/dL — ABNORMAL LOW (ref 3.5–5.0)
Alkaline Phosphatase: 47 U/L (ref 38–126)
Anion gap: 24 — ABNORMAL HIGH (ref 5–15)
BUN: 178 mg/dL — ABNORMAL HIGH (ref 8–23)
CO2: 20 mmol/L — ABNORMAL LOW (ref 22–32)
Calcium: 9.6 mg/dL (ref 8.9–10.3)
Chloride: 99 mmol/L (ref 98–111)
Creatinine, Ser: 28.39 mg/dL — ABNORMAL HIGH (ref 0.61–1.24)
GFR, Estimated: 1 mL/min — ABNORMAL LOW (ref >60)
Glucose, Bld: 90 mg/dL (ref 70–99)
Potassium: 7.4 mmol/L (ref 3.5–5.1)
Sodium: 143 mmol/L (ref 135–145)
Total Bilirubin: 0.4 mg/dL (ref 0.3–1.2)
Total Protein: 7 g/dL (ref 6.5–8.1)

## 2021-11-11 LAB — CBC WITH DIFFERENTIAL/PLATELET
Abs Immature Granulocytes: 0.02 10*3/uL (ref 0.00–0.07)
Basophils Absolute: 0 10*3/uL (ref 0.0–0.1)
Basophils Relative: 0 %
Eosinophils Absolute: 0 10*3/uL (ref 0.0–0.5)
Eosinophils Relative: 1 %
HCT: 26.2 % — ABNORMAL LOW (ref 39.0–52.0)
Hemoglobin: 8.1 g/dL — ABNORMAL LOW (ref 13.0–17.0)
Immature Granulocytes: 1 %
Lymphocytes Relative: 20 %
Lymphs Abs: 0.7 10*3/uL (ref 0.7–4.0)
MCH: 30.8 pg (ref 26.0–34.0)
MCHC: 30.9 g/dL (ref 30.0–36.0)
MCV: 99.6 fL (ref 80.0–100.0)
Monocytes Absolute: 0.6 10*3/uL (ref 0.1–1.0)
Monocytes Relative: 16 %
Neutro Abs: 2.3 10*3/uL (ref 1.7–7.7)
Neutrophils Relative %: 62 %
Platelets: 158 10*3/uL (ref 150–400)
RBC: 2.63 MIL/uL — ABNORMAL LOW (ref 4.22–5.81)
RDW: 19.7 % — ABNORMAL HIGH (ref 11.5–15.5)
WBC: 3.7 10*3/uL — ABNORMAL LOW (ref 4.0–10.5)
nRBC: 0 % (ref 0.0–0.2)

## 2021-11-11 LAB — CBG MONITORING, ED
Glucose-Capillary: 64 mg/dL — ABNORMAL LOW (ref 70–99)
Glucose-Capillary: 48 mg/dL — ABNORMAL LOW (ref 70–99)

## 2021-11-11 LAB — TROPONIN I (HIGH SENSITIVITY)
Troponin I (High Sensitivity): 51 ng/L — ABNORMAL HIGH (ref <18)
Troponin I (High Sensitivity): 74 ng/L — ABNORMAL HIGH (ref <18)

## 2021-11-11 LAB — MAGNESIUM: Magnesium: 2.2 mg/dL (ref 1.7–2.4)

## 2021-11-11 LAB — PHOSPHORUS: Phosphorus: 5.4 mg/dL — ABNORMAL HIGH (ref 2.5–4.6)

## 2021-11-11 LAB — GLUCOSE, CAPILLARY
Glucose-Capillary: 45 mg/dL — ABNORMAL LOW (ref 70–99)
Glucose-Capillary: 73 mg/dL (ref 70–99)

## 2021-11-11 LAB — HEPATITIS B SURFACE ANTIBODY,QUALITATIVE: Hep B S Ab: REACTIVE — AB

## 2021-11-11 LAB — HEPATITIS B SURFACE ANTIGEN: Hepatitis B Surface Ag: NONREACTIVE

## 2021-11-11 IMAGING — DX DG CHEST 1V PORT
1 series · 1 of 1 positions shown · non-contrast
Comparison: Chest XR, [DATE].  CT chest, [DATE].

CLINICAL DATA: Chest pain.

EXAM:
PORTABLE CHEST 1 VIEW

[chest ap]
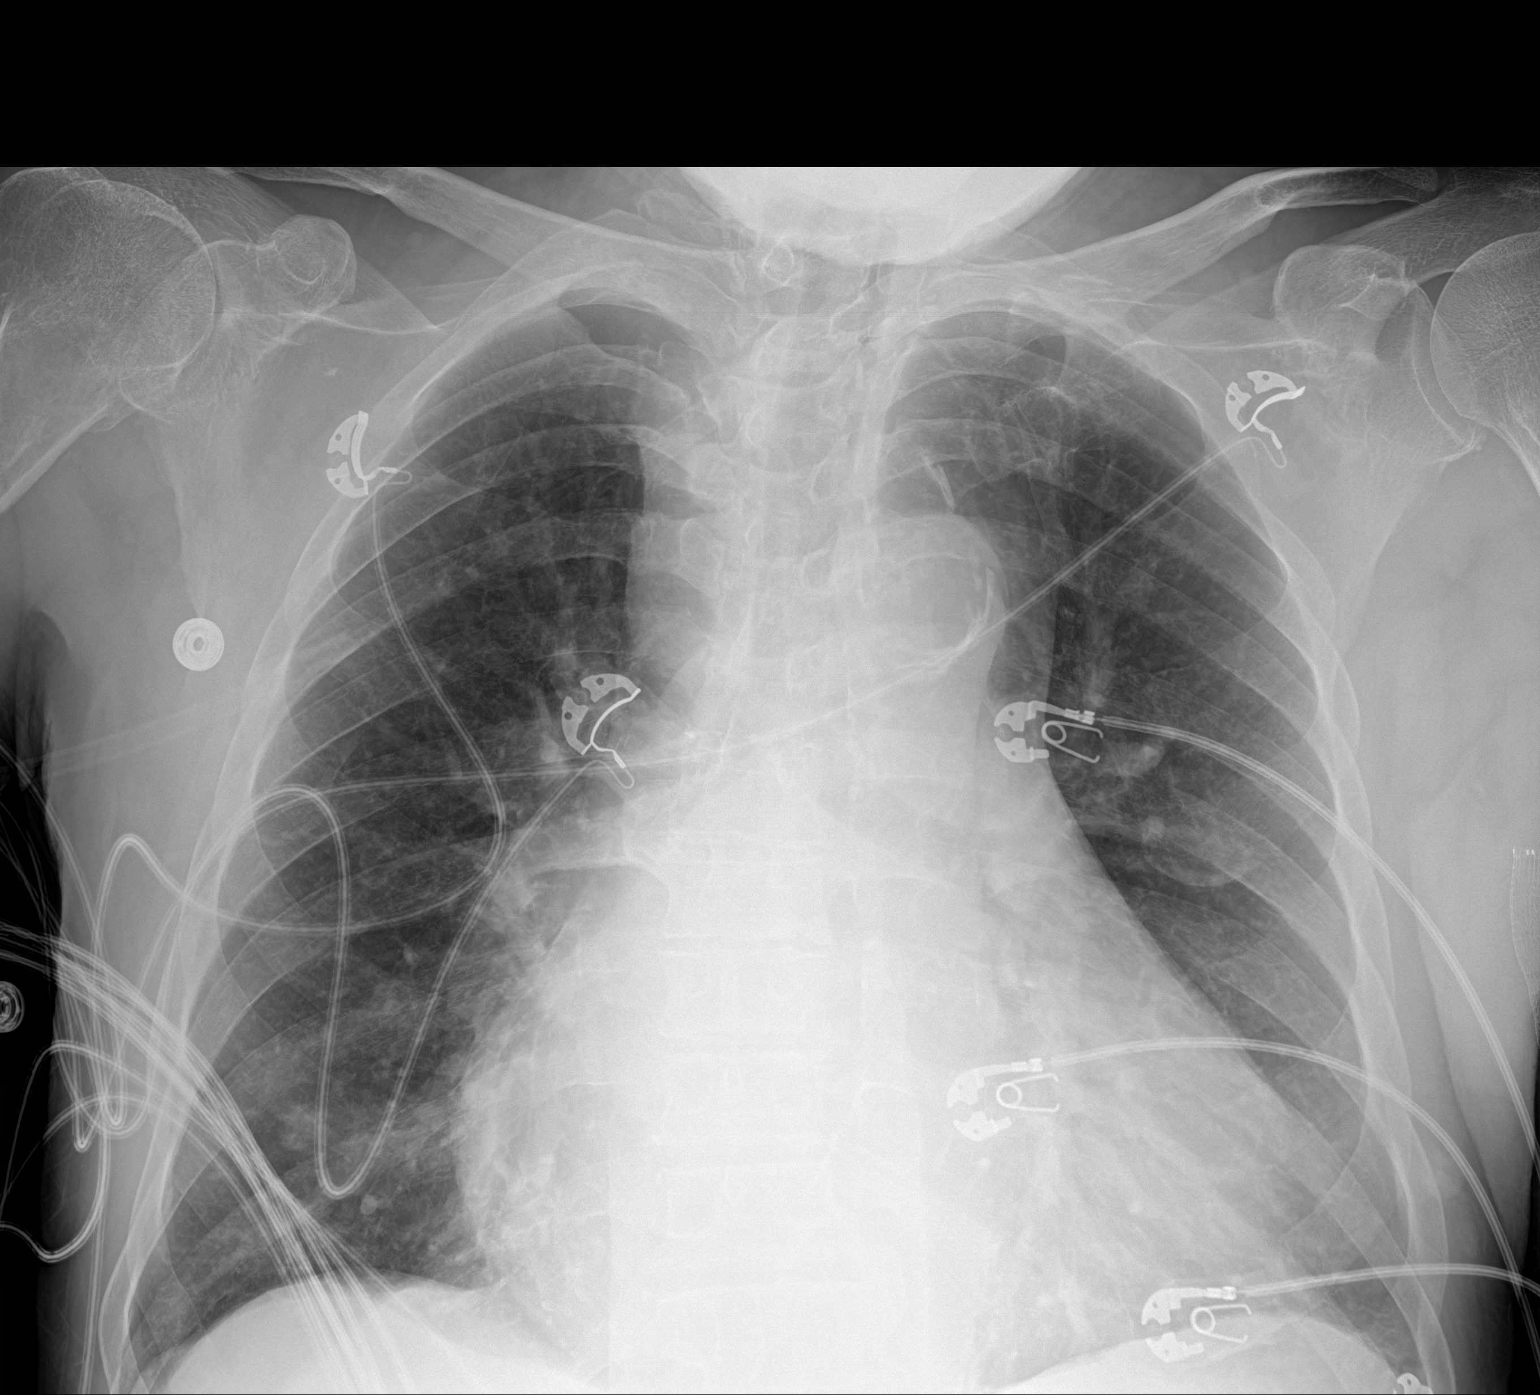

[1 of 1 positions shown; findings below may reference images not displayed]

FINDINGS: Cardiomediastinal silhouette is enlarged. Aortic aortic arch
calcifications. RIGHT basilar nodularity is suspected to represent a
nipple shadow. Trace streaky bibasilar opacities without focal
consolidation. No pleural effusion or pneumothorax. No acute
displaced fracture.
IMPRESSION: 1. Cardiomegaly and minimal basilar atelectasis.
2.  Aortic Atherosclerosis ([ZV]-[ZV]).

## 2021-11-11 MED ORDER — SODIUM ZIRCONIUM CYCLOSILICATE 10 G PO PACK
10.0000 g | PACK | Freq: Once | ORAL | Status: AC
  Administered 2021-11-11: 10 g via ORAL
  Filled 2021-11-11: qty 1

## 2021-11-11 MED ORDER — HEPARIN SODIUM (PORCINE) 5000 UNIT/ML IJ SOLN
5000.0000 [IU] | Freq: Two times a day (BID) | INTRAMUSCULAR | Status: DC
  Administered 2021-11-11 – 2021-12-01 (×36): 5000 [IU] via SUBCUTANEOUS
  Filled 2021-11-11 (×39): qty 1

## 2021-11-11 MED ORDER — CHLORHEXIDINE GLUCONATE CLOTH 2 % EX PADS: 6.0000 | MEDICATED_PAD | Freq: Every day | CUTANEOUS | Status: DC

## 2021-11-11 MED ORDER — LORAZEPAM 1 MG PO TABS
1.0000 mg | ORAL_TABLET | Freq: Two times a day (BID) | ORAL | Status: AC
  Administered 2021-11-11: 1 mg via ORAL
  Filled 2021-11-11: qty 1

## 2021-11-11 MED ORDER — CALCIUM GLUCONATE 10 % IV SOLN
1.0000 g | Freq: Once | INTRAVENOUS | Status: AC
  Administered 2021-11-11: 1 g via INTRAVENOUS
  Filled 2021-11-11: qty 10

## 2021-11-11 MED ORDER — FOLIC ACID 1 MG PO TABS
1.0000 mg | ORAL_TABLET | Freq: Every day | ORAL | Status: DC
  Administered 2021-11-12 – 2021-12-01 (×20): 1 mg via ORAL
  Filled 2021-11-11 (×20): qty 1

## 2021-11-11 MED ORDER — SODIUM CHLORIDE 0.9 % IV SOLN: 250.0000 mL | INTRAVENOUS | Status: DC | PRN

## 2021-11-11 MED ORDER — AMLODIPINE BESYLATE 5 MG PO TABS
5.0000 mg | ORAL_TABLET | Freq: Every day | ORAL | Status: DC
  Administered 2021-11-11 – 2021-11-30 (×20): 5 mg via ORAL
  Filled 2021-11-11 (×20): qty 1

## 2021-11-11 MED ORDER — ROSUVASTATIN CALCIUM 5 MG PO TABS
10.0000 mg | ORAL_TABLET | Freq: Every day | ORAL | Status: DC
  Administered 2021-11-12 – 2021-12-01 (×19): 10 mg via ORAL
  Filled 2021-11-11 (×19): qty 2

## 2021-11-11 MED ORDER — DEXTROSE 50 % IV SOLN
INTRAVENOUS | Status: AC
  Administered 2021-11-11: 25 mL via INTRAVENOUS
  Filled 2021-11-11: qty 50

## 2021-11-11 MED ORDER — SEVELAMER CARBONATE 800 MG PO TABS
4000.0000 mg | ORAL_TABLET | Freq: Three times a day (TID) | ORAL | Status: DC
  Administered 2021-11-12 – 2021-12-01 (×50): 4000 mg via ORAL
  Filled 2021-11-11 (×51): qty 5

## 2021-11-11 MED ORDER — ONDANSETRON HCL 4 MG/2ML IJ SOLN
4.0000 mg | Freq: Four times a day (QID) | INTRAMUSCULAR | Status: DC | PRN
  Administered 2021-11-13 – 2021-11-27 (×4): 4 mg via INTRAVENOUS
  Filled 2021-11-11 (×4): qty 2

## 2021-11-11 MED ORDER — CINACALCET HCL 30 MG PO TABS
180.0000 mg | ORAL_TABLET | ORAL | Status: DC
Start: 2021-11-13 — End: 2021-12-01
  Administered 2021-11-13 – 2021-11-30 (×9): 180 mg via ORAL
  Filled 2021-11-11 (×9): qty 6

## 2021-11-11 MED ORDER — POLYETHYLENE GLYCOL 3350 17 G PO PACK: 17.0000 g | PACK | Freq: Every day | ORAL | Status: DC | PRN

## 2021-11-11 MED ORDER — DEXTROSE 50 % IV SOLN
1.0000 | Freq: Once | INTRAVENOUS | Status: AC
Start: 2021-11-11 — End: 2021-11-11
  Administered 2021-11-11: 50 mL via INTRAVENOUS
  Filled 2021-11-11: qty 50

## 2021-11-11 MED ORDER — FLUTICASONE FUROATE-VILANTEROL 200-25 MCG/ACT IN AEPB
1.0000 | INHALATION_SPRAY | Freq: Every day | RESPIRATORY_TRACT | Status: DC
  Administered 2021-11-11 – 2021-12-01 (×17): 1 via RESPIRATORY_TRACT
  Filled 2021-11-11 (×2): qty 28

## 2021-11-11 MED ORDER — UMECLIDINIUM BROMIDE 62.5 MCG/ACT IN AEPB
1.0000 | INHALATION_SPRAY | Freq: Every day | RESPIRATORY_TRACT | Status: DC
  Administered 2021-11-12 – 2021-12-01 (×15): 1 via RESPIRATORY_TRACT
  Filled 2021-11-11 (×3): qty 7

## 2021-11-11 MED ORDER — PANTOPRAZOLE SODIUM 40 MG PO TBEC
40.0000 mg | DELAYED_RELEASE_TABLET | Freq: Two times a day (BID) | ORAL | Status: DC
  Administered 2021-11-12 – 2021-12-01 (×34): 40 mg via ORAL
  Filled 2021-11-11 (×34): qty 1

## 2021-11-11 MED ORDER — SODIUM CHLORIDE 0.9% FLUSH
3.0000 mL | INTRAVENOUS | Status: DC | PRN
  Administered 2021-11-14 – 2021-11-29 (×2): 3 mL via INTRAVENOUS

## 2021-11-11 MED ORDER — INSULIN ASPART 100 UNIT/ML IV SOLN
5.0000 [IU] | Freq: Once | INTRAVENOUS | Status: AC
  Administered 2021-11-11: 5 [IU] via INTRAVENOUS

## 2021-11-11 MED ORDER — DARBEPOETIN ALFA 200 MCG/0.4ML IJ SOSY
200.0000 ug | PREFILLED_SYRINGE | INTRAMUSCULAR | Status: DC
  Administered 2021-11-18 – 2021-11-25 (×2): 200 ug via INTRAVENOUS
  Filled 2021-11-11 (×5): qty 0.4

## 2021-11-11 MED ORDER — ACETAMINOPHEN 325 MG PO TABS
650.0000 mg | ORAL_TABLET | ORAL | Status: DC | PRN
  Administered 2021-11-14 – 2021-11-29 (×6): 650 mg via ORAL
  Filled 2021-11-11 (×6): qty 2

## 2021-11-11 MED ORDER — ALBUTEROL SULFATE HFA 108 (90 BASE) MCG/ACT IN AERS: 2.0000 | INHALATION_SPRAY | RESPIRATORY_TRACT | Status: DC | PRN

## 2021-11-11 MED ORDER — CALCITRIOL 0.5 MCG PO CAPS
0.5000 ug | ORAL_CAPSULE | ORAL | Status: DC
Start: 2021-11-13 — End: 2021-12-01
  Administered 2021-11-13 – 2021-11-30 (×8): 0.5 ug via ORAL
  Filled 2021-11-11 (×9): qty 1

## 2021-11-11 MED ORDER — DEXTROSE 50 % IV SOLN
INTRAVENOUS | Status: AC
  Administered 2021-11-11: 50 mL via INTRAVENOUS
  Filled 2021-11-11: qty 50

## 2021-11-11 MED ORDER — SODIUM CHLORIDE 0.9% FLUSH
3.0000 mL | Freq: Two times a day (BID) | INTRAVENOUS | Status: DC
  Administered 2021-11-11 – 2021-11-30 (×35): 3 mL via INTRAVENOUS

## 2021-11-11 MED ORDER — DEXTROSE 50 % IV SOLN: 0.5000 | Freq: Once | INTRAVENOUS | Status: AC

## 2021-11-11 MED ORDER — THIAMINE HCL 100 MG PO TABS
100.0000 mg | ORAL_TABLET | Freq: Every day | ORAL | Status: DC
  Administered 2021-11-12 – 2021-12-01 (×20): 100 mg via ORAL
  Filled 2021-11-11 (×20): qty 1

## 2021-11-11 MED ORDER — MOMETASONE FURO-FORMOTEROL FUM 200-5 MCG/ACT IN AERO: 2.0000 | INHALATION_SPRAY | Freq: Two times a day (BID) | RESPIRATORY_TRACT | Status: DC

## 2021-11-11 MED ORDER — AMIODARONE HCL 200 MG PO TABS
200.0000 mg | ORAL_TABLET | Freq: Every day | ORAL | Status: DC
  Administered 2021-11-12 – 2021-12-01 (×20): 200 mg via ORAL
  Filled 2021-11-11 (×19): qty 1

## 2021-11-11 MED ORDER — DEXTROSE 50 % IV SOLN: 1.0000 | Freq: Once | INTRAVENOUS | Status: AC

## 2021-11-11 MED ORDER — CARVEDILOL 12.5 MG PO TABS
12.5000 mg | ORAL_TABLET | Freq: Two times a day (BID) | ORAL | Status: DC
  Administered 2021-11-11 – 2021-12-01 (×38): 12.5 mg via ORAL
  Filled 2021-11-11 (×38): qty 1

## 2021-11-11 MED ORDER — CALCITRIOL 0.5 MCG PO CAPS
2.0000 ug | ORAL_CAPSULE | ORAL | Status: DC
  Administered 2021-11-13: 2 ug via ORAL
  Filled 2021-11-11: qty 4

## 2021-11-11 MED ORDER — ACETAMINOPHEN 325 MG PO TABS: 650.0000 mg | ORAL_TABLET | Freq: Four times a day (QID) | ORAL | Status: DC | PRN

## 2021-11-11 MED ORDER — DEXTROSE 50 % IV SOLN
1.0000 | Freq: Once | INTRAVENOUS | Status: AC
  Administered 2021-11-11: 50 mL via INTRAVENOUS
  Filled 2021-11-11: qty 50

## 2021-11-11 MED ORDER — ASPIRIN EC 81 MG PO TBEC
81.0000 mg | DELAYED_RELEASE_TABLET | Freq: Every day | ORAL | Status: DC
  Administered 2021-11-12 – 2021-11-25 (×14): 81 mg via ORAL
  Filled 2021-11-11 (×14): qty 1

## 2021-11-11 NOTE — ED Notes (Signed)
Report given to CN Santiago Glad at [facility] ED ?

## 2021-11-11 NOTE — Consult Note (Signed)
Renal Service ?Consult Note ?[facility] ? ?Raymond [name] ?11/11/2021 ?[name], MD ?Requesting Physician: Dr. Matilde Sprang ? ?Reason for Consult: ESRD pt w/ SOB, missed HD ?HPI: The patient is a 66 y.o. year-old w/ hx of COPD, CAD, ESRD on HD MWF, cocaine abuse, HTN, homelessness presenting to ED w/ SOB and chest pain. Missed HD x 2 wks. Asked to see for dialysis.  ? ?Seen in ED. Pt remains homeless, sleeps "here and there". On HD x 8-9 yrs, L arm AVF, TTS.  +LE edema, no sig SOB.  ? ?ROS - denies CP, no joint pain, no HA, no blurry vision, no rash, no diarrhea, no nausea/ vomiting, no dysuria, no difficulty voiding ? ? ?Past Medical History  ?Past Medical History:  ?Diagnosis Date  ? Anemia   ? CAD (coronary artery disease)   ? a. 03/2018: cath showing 20 to 30% stenosis along the LAD, luminal irregularities along the RCA, and angiographically normal LCx  ? COPD (chronic obstructive pulmonary disease) ([place])   ? ESRD (end stage renal disease) on dialysis [facility])   ? "MWF; Jeneen Rinks" (07/22/17)  ? Hepatitis C   ? Still positive s/p liver biopsy at [facility]  and interferon therapy for 6 months. Most recent lab work was on 10/24/12  ? Hepatitis C   ? "took the tx; gone now" (12/05/2016)  Was treated  ? History of blood transfusion ~ 2012/2013  ? "related to my kidneys; blood was low"  ? Hypertension   ? Peripheral arterial disease ([place])   ? Substance abuse ([place])   ? Thyroid disease   ? Type 2 diabetes mellitus with other diabetic kidney complication ([place]) 5/63/1497  ? ?Past Surgical History  ?Past Surgical History:  ?Procedure Laterality Date  ? ABDOMINAL AORTOGRAM W/LOWER EXTREMITY N/A 02/08/2018  ? Procedure: ABDOMINAL AORTOGRAM W/LOWER EXTREMITY;  Surgeon: Conrad Ilion, MD;  Location: [place] CV LAB;  Service: Cardiovascular;  Laterality: N/A;  ? ABDOMINAL AORTOGRAM W/LOWER EXTREMITY Bilateral 06/15/2020  ? Procedure: ABDOMINAL AORTOGRAM W/LOWER EXTREMITY;  Surgeon: Waynetta Sandy, MD;   Location: [place] CV LAB;  Service: Cardiovascular;  Laterality: Bilateral;  ? AV FISTULA PLACEMENT Left Aug. 2013 ?  ? BIOPSY  01/17/2020  ? Procedure: BIOPSY;  Surgeon: Jackquline Denmark, MD;  Location: [facility] ENDOSCOPY;  Service: Endoscopy;;  ? COLONOSCOPY WITH PROPOFOL N/A 08/21/2018  ? Procedure: COLONOSCOPY WITH PROPOFOL;  Surgeon: Yetta Flock, MD;  Location: [place];  Service: Gastroenterology;  Laterality: N/A;  ? ENTEROSCOPY N/A 01/17/2020  ? Procedure: ENTEROSCOPY;  Surgeon: Jackquline Denmark, MD;  Location: [facility] ENDOSCOPY;  Service: Endoscopy;  Laterality: N/A;  ? ENTEROSCOPY N/A 07/31/2020  ? Procedure: ENTEROSCOPY;  Surgeon: Doran Stabler, MD;  Location: [place];  Service: Gastroenterology;  Laterality: N/A;  ? ENTEROSCOPY N/A 01/27/2021  ? Procedure: ENTEROSCOPY;  Surgeon: Yetta Flock, MD;  Location: [facility] ENDOSCOPY;  Service: Gastroenterology;  Laterality: N/A;  ? ESOPHAGOGASTRODUODENOSCOPY (EGD) WITH PROPOFOL N/A 08/20/2018  ? Procedure: ESOPHAGOGASTRODUODENOSCOPY (EGD) WITH PROPOFOL;  Surgeon: Gatha Mayer, MD;  Location: [facility] ENDOSCOPY;  Service: Endoscopy;  Laterality: N/A;  ? FEMORAL-POPLITEAL BYPASS GRAFT Left 04/11/2018  ? Procedure: BYPASS GRAFT FEMORAL-POPLITEAL ARTERY LEFT LEG;  Surgeon: Rosetta Posner, MD;  [facility] OR;  Service: Vascular;  Laterality: Left;  ? GIVENS CAPSULE STUDY N/A 07/31/2020  ? Procedure: GIVENS CAPSULE STUDY;  Surgeon: Doran Stabler, MD;  Location: [place];  Service: Gastroenterology;  Laterality: N/A;  ? HOT HEMOSTASIS  N/A 08/20/2018  ? Procedure: HOT HEMOSTASIS (ARGON PLASMA COAGULATION/BICAP);  Surgeon: Gatha Mayer, MD;  Location: [facility] ENDOSCOPY;  Service: Endoscopy;  Laterality: N/A;  ? HOT HEMOSTASIS N/A 01/17/2020  ? Procedure: HOT HEMOSTASIS (ARGON PLASMA COAGULATION/BICAP);  Surgeon: Jackquline Denmark, MD;  Location: [facility] ENDOSCOPY;  Service: Endoscopy;  Laterality: N/A;  ? HOT HEMOSTASIS N/A 01/27/2021  ? Procedure: HOT HEMOSTASIS (ARGON  PLASMA COAGULATION/BICAP);  Surgeon: Yetta Flock, MD;  Location: [facility] ENDOSCOPY;  Service: Gastroenterology;  Laterality: N/A;  ? LEFT HEART CATH AND CORONARY ANGIOGRAPHY N/A 03/29/2018  ? Procedure: LEFT HEART CATH AND CORONARY ANGIOGRAPHY;  Surgeon: Nelva Bush, MD;  Location: [place] CV LAB;  Service: Cardiovascular;  Laterality: N/A;  ? LIVER BIOPSY    ? ORIF ULNAR FRACTURE Left 05/18/2017  ? Procedure: OPEN REDUCTION INTERNAL FIXATION (ORIF) ULNAR FRACTURE;  Surgeon: Iran Planas, MD;  Location: [place];  Service: Orthopedics;  Laterality: Left;  ? PERIPHERAL VASCULAR INTERVENTION Right 06/15/2020  ? Procedure: PERIPHERAL VASCULAR INTERVENTION;  Surgeon: Waynetta Sandy, MD;  Location: [place] CV LAB;  Service: Cardiovascular;  Laterality: Right;  ? POLYPECTOMY  08/21/2018  ? Procedure: POLYPECTOMY;  Surgeon: Yetta Flock, MD;  Location: [facility] ENDOSCOPY;  Service: Gastroenterology;;  ? SHUNTOGRAM N/A 12/11/2012  ? Procedure: SHUNTOGRAM;  Surgeon: Serafina Mitchell, MD;  Location: [place] CATH LAB;  Service: Cardiovascular;  Laterality: N/A;  ? WOUND EXPLORATION Left 06/15/2020  ? Procedure: GROIN  EXPLORATION;  Surgeon: Waynetta Sandy, MD;  Location: [place];  Service: Vascular;  Laterality: Left;  ? ?Family History  ?Family History  ?Problem Relation Age of Onset  ? Diabetes Father   ? Hypertension Father   ? Heart disease Father   ? ?Social History  reports that he has been smoking cigarettes. He has a 5.00 pack-year smoking history. He has never used smokeless tobacco. He reports current alcohol use of about 10.0 standard drinks per week. He reports current drug use. Drug: Cocaine. ?Allergies No Known Allergies ?Home medications ?Prior to Admission medications   ?Medication Sig Start Date End Date Taking? Authorizing Provider  ?acetaminophen (TYLENOL) 325 MG tablet Take 2 tablets (650 mg total) by mouth every 6 (six) hours as needed for mild pain (or Fever >/= 101). 09/17/21    Raiford Noble Latif, DO  ?albuterol (VENTOLIN HFA) 108 (90 Base) MCG/ACT inhaler Inhale 2 puffs into the lungs every 4 (four) hours as needed for shortness of breath or wheezing. 11/06/19   [provider]  ?amiodarone (PACERONE) 200 MG tablet Take 200 mg by mouth daily. 06/17/21   [provider]  ?amLODipine (NORVASC) 5 MG tablet Take 5 mg by mouth at bedtime. 07/22/21   [provider]  ?[place] Patter 200-25 MCG/ACT AEPB Inhale 1 puff into the lungs daily. 09/17/21   [provider]  ?calcitRIOL (ROCALTROL) 0.5 MCG capsule Take 4 capsules (2 mcg total) by mouth Every Tuesday,Thursday,and Saturday with dialysis. 05/27/21   France Ravens, MD  ?carvedilol (COREG) 12.5 MG tablet Take 12.5 mg by mouth 2 (two) times daily. 07/06/21   [provider]  ?cinacalcet (SENSIPAR) 30 MG tablet Take 6 tablets (180 mg total) by mouth Every Tuesday,Thursday,and Saturday with dialysis. 12/12/20   Mercy Riding, MD  ?Darbepoetin Alfa (ARANESP) 200 MCG/0.4ML SOSY injection Inject 0.4 mLs (200 mcg total) into the vein every Tuesday with hemodialysis. 05/25/21   France Ravens, MD  ?folic acid (FOLVITE) 1 MG tablet Take 1 tablet (1 mg total) by mouth daily.  09/17/21   Raiford Noble Latif, DO  ?mometasone-formoterol (DULERA) 200-5 MCG/ACT AERO Inhale 2 puffs into the lungs 2 (two) times daily. 09/17/21   Kerney Elbe, DO  ?multivitamin (RENA-VIT) TABS tablet Take 1 tablet by mouth at bedtime. 09/17/21   Raiford Noble Latif, DO  ?nicotine (NICODERM CQ - DOSED IN MG/24 HOURS) 14 mg/24hr patch Place 1 patch (14 mg total) onto the skin daily. 09/17/21   Raiford Noble Latif, DO  ?pantoprazole (PROTONIX) 40 MG tablet Take 1 tablet (40 mg total) by mouth 2 (two) times daily before a meal. 09/17/21   Sheikh, Georgina Quint Latif, DO  ?polyethylene glycol powder (GLYCOLAX/MIRALAX) 17 GM/SCOOP powder Take 17 g by mouth daily as needed for mild constipation. 09/17/21   Raiford Noble Latif, DO  ?rosuvastatin (CRESTOR) 10 MG  tablet Take 1 tablet (10 mg total) by mouth daily. 12/11/20 12/18/21  Mercy Riding, MD  ?sevelamer carbonate (RENVELA) 800 MG tablet Take 5 tablets (4,000 mg total) by mouth 3 (three) times daily with meals. 09/17/21

## 2021-11-11 NOTE — ED Provider Notes (Signed)
?[place] ? ? ? ?CSN: [id number] ?Arrival date & time: 11/11/21  6301 ? ? ?  ? ?History   ?Chief Complaint ?Chief Complaint  ?Patient presents with  ? Chest Pain  ? Shortness of Breath  ? ? ?HPI ?Masahiro HYMIE GORR is a 66 y.o. male.  ? ?Patient is here for chest pain and sob that started bout 2 days ago.  Burning in his stomach.   Stomach pain is worse at the moment. C/o bricks on his chest.  ?No nausea, + diarrhea.   ?Slight swelling at the LE bilaterally.  ? ?He is currently homeless, and he does not have his meds. Has been out for about a month.   Last dialysis was 1-2 weeks ago.  ? ?He drinks daily.  He also does cocaine, last use was yesterday.  ? ?He was in the ER last month for sob.  ? ?Past Medical History:  ?Diagnosis Date  ? Anemia   ? CAD (coronary artery disease)   ? a. 03/2018: cath showing 20 to 30% stenosis along the LAD, luminal irregularities along the RCA, and angiographically normal LCx  ? COPD (chronic obstructive pulmonary disease) ([place])   ? ESRD (end stage renal disease) on dialysis [facility])   ? "MWF; Jeneen Rinks" (07/22/17)  ? Hepatitis C   ? Still positive s/p liver biopsy at [facility]  and interferon therapy for 6 months. Most recent lab work was on 10/24/12  ? Hepatitis C   ? "took the tx; gone now" (12/05/2016)  Was treated  ? History of blood transfusion ~ 2012/2013  ? "related to my kidneys; blood was low"  ? Hypertension   ? Peripheral arterial disease ([place])   ? Substance abuse ([place])   ? Thyroid disease   ? Type 2 diabetes mellitus with other diabetic kidney complication ([place]) 12/10/930  ? ? ?Patient Active Problem List  ? Diagnosis Date Noted  ? Thrombocytopenia ([place]) 09/15/2021  ? Hypertensive urgency 09/13/2021  ? Uncontrolled hypertension   ? Uremia   ? Severe episode of recurrent major depressive disorder, without psychotic features ([place])   ? ESRD (end stage renal disease) on dialysis ([place]) 05/18/2021  ? Cardiac arrest ([place]) 04/03/2021  ? Anisocoria 04/03/2021  ? HFrEF (heart failure  with reduced ejection fraction) ([place]) 04/03/2021  ? Hypothermia not due to cold exposure 04/03/2021  ? Junctional bradycardia 04/03/2021  ? Pancytopenia ([place]) 01/25/2021  ? AMS (altered mental status) 01/19/2021  ? Chronic viral hepatitis C ([place]) 12/25/2020  ? 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 ([place]) 08/05/2020  ? Acute upper GI bleed 07/29/2020  ? Acute metabolic encephalopathy 35/57/3220  ? COVID-19 virus infection 07/29/2020  ? AVM (arteriovenous malformation) of stomach, acquired with hemorrhage   ? AVM (arteriovenous malformation) of small bowel, acquired with hemorrhage   ? Current use of long term anticoagulation   ? Sensorineural hearing loss (SNHL) of both ears 03/30/2020  ? History of arteriovenous malformation (AVM) 01/14/2020  ? UGIB (upper gastrointestinal bleed) 01/14/2020  ? Acute blood loss anemia 01/14/2020  ? Elevated troponin 01/14/2020  ? Allergy, unspecified, initial encounter 04/29/2019  ? Gastroenteritis 12/24/2018  ? Substance use disorder 12/24/2018  ? Type 2 diabetes mellitus with other diabetic kidney complication ([place]) 25/42/7062  ? Noncompliance of patient with renal dialysis 09/12/2018  ? Benign neoplasm of colon   ? Melena   ? Anemia due to chronic kidney disease, on chronic dialysis ([place])   ? Anemia   ? H/O medication noncompliance   ?  Polysubstance (excluding opioids) dependence ([place])   ? Atrial flutter with rapid ventricular response ([place]) 08/10/2018  ? Chest pain 08/10/2018  ? PAF (paroxysmal atrial fibrillation) ([place]) 08/01/2018  ? Acute respiratory failure with hypoxia ([place]) 07/13/2018  ? Non-compliance with renal dialysis 07/13/2018  ? PAD (peripheral artery disease) ([place]) 04/11/2018  ? Sepsis ([place]) 03/31/2018  ? Preop cardiovascular exam 03/29/2018  ? Idiopathic gout, unspecified site 12/13/2017  ? Other specified complication of vascular prosthetic devices, implants and grafts, initial encounter ([place]) 11/03/2017  ? Volume overload 08/07/2017  ? Acute pulmonary  edema (H[facility])   ? Dyspnea 06/20/2017  ? Acute bronchitis   ? COPD (chronic obstructive pulmonary disease) ([place])   ? ESRD on hemodialysis ([place])   ? Hypoxia 12/05/2016  ? Hyperkalemia 12/19/2015  ? Hypoglycemia 12/19/2015  ? Polysubstance abuse ([place]) 12/19/2015  ? Homeless 12/19/2015  ? Anemia associated with stage 5 chronic renal failure ([place]) 12/19/2015  ? BPH without urinary obstruction 10/29/2015  ? Erectile dysfunction 10/29/2015  ? Hematuria, gross 10/29/2015  ? Hypercalcemia 11/07/2014  ? Pruritus, unspecified 10/17/2014  ? Coagulation defect, unspecified ([place]) 09/17/2014  ? Atypical chest pain 12/19/2013  ? Thrombocytopenia, unspecified ([place]) 10/17/2013  ? Iron deficiency anemia, unspecified 08/28/2013  ? Atherosclerosis of native arteries of extremity with intermittent claudication ([place]) 12/04/2012  ? ESRD (end stage renal disease) ([place]) 12/04/2012  ? Hypertensive chronic kidney disease with stage 5 chronic kidney disease or end stage renal disease ([place]) 11/20/2012  ? Secondary hyperparathyroidism of renal origin ([place]) 11/20/2012  ? HEPATITIS C 09/07/2006  ? HYPERCHOLESTEROLEMIA 09/07/2006  ? HYPERTENSION, BENIGN SYSTEMIC 09/07/2006  ? ? ?Past Surgical History:  ?Procedure Laterality Date  ? ABDOMINAL AORTOGRAM W/LOWER EXTREMITY N/A 02/08/2018  ? Procedure: ABDOMINAL AORTOGRAM W/LOWER EXTREMITY;  Surgeon: Conrad [place], MD;  Location: [place] CV LAB;  Service: Cardiovascular;  Laterality: N/A;  ? ABDOMINAL AORTOGRAM W/LOWER EXTREMITY Bilateral 06/15/2020  ? Procedure: ABDOMINAL AORTOGRAM W/LOWER EXTREMITY;  Surgeon: Waynetta Sandy, MD;  Location: [place] CV LAB;  Service: Cardiovascular;  Laterality: Bilateral;  ? AV FISTULA PLACEMENT Left Aug. 2013 ?  ? BIOPSY  01/17/2020  ? Procedure: BIOPSY;  Surgeon: Jackquline Denmark, MD;  Location: [facility] ENDOSCOPY;  Service: Endoscopy;;  ? COLONOSCOPY WITH PROPOFOL N/A 08/21/2018  ? Procedure: COLONOSCOPY WITH PROPOFOL;  Surgeon: Yetta Flock, MD;  Location:  [place];  Service: Gastroenterology;  Laterality: N/A;  ? ENTEROSCOPY N/A 01/17/2020  ? Procedure: ENTEROSCOPY;  Surgeon: Jackquline Denmark, MD;  Location: [facility] ENDOSCOPY;  Service: Endoscopy;  Laterality: N/A;  ? ENTEROSCOPY N/A 07/31/2020  ? Procedure: ENTEROSCOPY;  Surgeon: Doran Stabler, MD;  Location: [place];  Service: Gastroenterology;  Laterality: N/A;  ? ENTEROSCOPY N/A 01/27/2021  ? Procedure: ENTEROSCOPY;  Surgeon: Yetta Flock, MD;  Location: [facility] ENDOSCOPY;  Service: Gastroenterology;  Laterality: N/A;  ? ESOPHAGOGASTRODUODENOSCOPY (EGD) WITH PROPOFOL N/A 08/20/2018  ? Procedure: ESOPHAGOGASTRODUODENOSCOPY (EGD) WITH PROPOFOL;  Surgeon: Gatha Mayer, MD;  Location: [facility] ENDOSCOPY;  Service: Endoscopy;  Laterality: N/A;  ? FEMORAL-POPLITEAL BYPASS GRAFT Left 04/11/2018  ? Procedure: BYPASS GRAFT FEMORAL-POPLITEAL ARTERY LEFT LEG;  Surgeon: Rosetta Posner, MD;  [facility]: [facility] OR;  Service: Vascular;  Laterality: Left;  ? GIVENS CAPSULE STUDY N/A 07/31/2020  ? Procedure: GIVENS CAPSULE STUDY;  Surgeon: Doran Stabler, MD;  Location: [place];  Service: Gastroenterology;  Laterality: N/A;  ? HOT HEMOSTASIS N/A 08/20/2018  ? Procedure: HOT HEMOSTASIS (ARGON PLASMA COAGULATION/BICAP);  Surgeon: Gatha Mayer, MD;  Location: [facility] ENDOSCOPY;  Service: Endoscopy;  Laterality: N/A;  ? HOT HEMOSTASIS N/A 01/17/2020  ? Procedure: HOT HEMOSTASIS (ARGON PLASMA COAGULATION/BICAP);  Surgeon: Jackquline Denmark, MD;  Location: [facility] ENDOSCOPY;  Service: Endoscopy;  Laterality: N/A;  ? HOT HEMOSTASIS N/A 01/27/2021  ? Procedure: HOT HEMOSTASIS (ARGON PLASMA COAGULATION/BICAP);  Surgeon: Yetta Flock, MD;  Location: [facility] ENDOSCOPY;  Service: Gastroenterology;  Laterality: N/A;  ? LEFT HEART CATH AND CORONARY ANGIOGRAPHY N/A 03/29/2018  ? Procedure: LEFT HEART CATH AND CORONARY ANGIOGRAPHY;  Surgeon: Nelva Bush, MD;  Location: [place] CV LAB;  Service: Cardiovascular;  Laterality: N/A;  ? LIVER BIOPSY    ?  ORIF ULNAR FRACTURE Left 05/18/2017  ? Procedure: OPEN REDUCTION INTERNAL FIXATION (ORIF) ULNAR FRACTURE;  Surgeon: Iran Planas, MD;  Location: [place];  Service: Orthopedics;  Laterality: Left;  ?

## 2021-11-11 NOTE — Progress Notes (Signed)
Pt receives out-pt HD at [facility]) on TTS. Pt is to arrive at 11:35 for 11:55 chair time. Per chart review, pt has missed multiple treatments at out-pt HD clinic. Contacted out-pt clinic social worker to gain insight in to pt's case. Pt is currently homeless and clinic social worker provided pt with resources but unsure if pt has f/u on any of them. Clinic social worker also advised pt to contact Medicaid transportation to advise them to use [facility] address as a pick up/drop off address for pt on HD days until pt can secure housing. It is unknown if pt has made this contact or not. Will assist as needed.  ? ?Melven Sartorius ?Renal Navigator ?[phone] ?

## 2021-11-11 NOTE — ED Triage Notes (Signed)
Patient coming from urgent care, complaint of chest pain after cocaine use and missing dialysis 2 weeks. Received nitro with [facility]. ?

## 2021-11-11 NOTE — Progress Notes (Signed)
Heart Failure Navigator Progress Note ? ?Assessed for Heart & Vascular TOC clinic readiness.  ?Patient does not meet criteria due he  is a Hemodialysis patient.  ? ? ? ?Earnestine Leys, BSN, RN ?Heart Failure Nurse Navigator ?Secure Chat Only   ?

## 2021-11-11 NOTE — H&P (Addendum)
History and Physical    [name] UJW:[id number] DOB: [birth year] DOA: 11/11/2021  PCP: [name], FNP (Confirm with patient/family/NH records and if not entered, this has to be entered at [facility] point of entry) Patient coming from: Home  I have personally briefly reviewed patient's old medical records in [facility] Health Link  Chief Complaint: Chest pain, SOB  HPI: Raymond [name] is a 66 y.o. male with medical history significant of ESRD on HD TTS and noncompliant with HD, nonobstructive CAD, chronic diastolic CHF, PAF not on anticoagulation due to history of severe GI bleed, COPD, HTN, chronic anemia secondary to CKD, crack cocaine abuse, presented with new onset of chest pain and shortness of breath.  Right now, patient is confused and only able to provide part of the history.  Patient admitted that he has not been to the hemodialysis for 2 weeks, however he cannot provide any reason behind it.  And admitted continue to use crack cocaine.  This morning, patient woke up with sharp-like chest pain, centrally located, associated with shortness of breath.  Pain has been constant, 5-6/10.  Patient went to urgent care for that matter, was given nitroglycerin and chest pain subsided.  Patient denied any suicidal ideation with plan.  ED Course: Pressure significant elevated, no tachycardia, O2 saturation 98% on room air.  Blood work showed K7.5, EKG showed chronic T wave changes.  X-ray showed cardiomegaly and pulmonary congestion.  Nephrology contacted and plans for emergency dialysis.  Review of Systems: Unable to perform, patient is confused.  Past Medical History:  Diagnosis Date   Anemia    CAD (coronary artery disease)    a. 03/2018: cath showing 20 to 30% stenosis along the LAD, luminal irregularities along the RCA, and angiographically normal LCx   COPD (chronic obstructive pulmonary disease) (HCC)    ESRD (end stage renal disease) on dialysis [facility])    "MWF; [facility]" (07/22/17)    Hepatitis C    Still positive s/p liver biopsy at [facility]  and interferon therapy for 6 months. Most recent lab work was on 10/24/12   Hepatitis C    "took the tx; gone now" (12/05/2016)  Was treated   History of blood transfusion ~ 2012/2013   "related to my kidneys; blood was low"   Hypertension    Peripheral arterial disease (HCC)    Substance abuse (HCC)    Thyroid disease    Type 2 diabetes mellitus with other diabetic kidney complication (HCC) 09/24/2018    Past Surgical History:  Procedure Laterality Date   ABDOMINAL AORTOGRAM W/LOWER EXTREMITY N/A 02/08/2018   Procedure: ABDOMINAL AORTOGRAM W/LOWER EXTREMITY;  Surgeon: Fransisco Hertz, MD;  Location: [facility] INVASIVE CV LAB;  Service: Cardiovascular;  Laterality: N/A;   ABDOMINAL AORTOGRAM W/LOWER EXTREMITY Bilateral 06/15/2020   Procedure: ABDOMINAL AORTOGRAM W/LOWER EXTREMITY;  Surgeon: Maeola Harman, MD;  Location: [facility] INVASIVE CV LAB;  Service: Cardiovascular;  Laterality: Bilateral;   AV FISTULA PLACEMENT Left Aug. 2013 ?   BIOPSY  01/17/2020   Procedure: BIOPSY;  Surgeon: Lynann Bologna, MD;  Location: [facility] ENDOSCOPY;  Service: Endoscopy;;   COLONOSCOPY WITH PROPOFOL N/A 08/21/2018   Procedure: COLONOSCOPY WITH PROPOFOL;  Surgeon: Benancio Deeds, MD;  Location: [facility] ENDOSCOPY;  Service: Gastroenterology;  Laterality: N/A;   ENTEROSCOPY N/A 01/17/2020   Procedure: ENTEROSCOPY;  Surgeon: Lynann Bologna, MD;  Location: [facility] ENDOSCOPY;  Service: Endoscopy;  Laterality: N/A;   ENTEROSCOPY N/A 07/31/2020   Procedure: ENTEROSCOPY;  Surgeon: Amada Jupiter  L III, MD;  Location: [facility] ENDOSCOPY;  Service: Gastroenterology;  Laterality: N/A;   ENTEROSCOPY N/A 01/27/2021   Procedure: ENTEROSCOPY;  Surgeon: Benancio Deeds, MD;  Location: [facility] ENDOSCOPY;  Service: Gastroenterology;  Laterality: N/A;   ESOPHAGOGASTRODUODENOSCOPY (EGD) WITH PROPOFOL N/A 08/20/2018   Procedure: ESOPHAGOGASTRODUODENOSCOPY (EGD) WITH PROPOFOL;  Surgeon: Iva Boop,  MD;  Location: [facility] ENDOSCOPY;  Service: Endoscopy;  Laterality: N/A;   FEMORAL-POPLITEAL BYPASS GRAFT Left 04/11/2018   Procedure: BYPASS GRAFT FEMORAL-POPLITEAL ARTERY LEFT LEG;  Surgeon: Larina Earthly, MD;  Location: [facility] OR;  Service: Vascular;  Laterality: Left;   GIVENS CAPSULE STUDY N/A 07/31/2020   Procedure: GIVENS CAPSULE STUDY;  Surgeon: Sherrilyn Rist, MD;  Location: [facility] ENDOSCOPY;  Service: Gastroenterology;  Laterality: N/A;   HOT HEMOSTASIS N/A 08/20/2018   Procedure: HOT HEMOSTASIS (ARGON PLASMA COAGULATION/BICAP);  Surgeon: Iva Boop, MD;  Location: [facility] ENDOSCOPY;  Service: Endoscopy;  Laterality: N/A;   HOT HEMOSTASIS N/A 01/17/2020   Procedure: HOT HEMOSTASIS (ARGON PLASMA COAGULATION/BICAP);  Surgeon: Lynann Bologna, MD;  Location: [facility] ENDOSCOPY;  Service: Endoscopy;  Laterality: N/A;   HOT HEMOSTASIS N/A 01/27/2021   Procedure: HOT HEMOSTASIS (ARGON PLASMA COAGULATION/BICAP);  Surgeon: Benancio Deeds, MD;  Location: [facility] ENDOSCOPY;  Service: Gastroenterology;  Laterality: N/A;   LEFT HEART CATH AND CORONARY ANGIOGRAPHY N/A 03/29/2018   Procedure: LEFT HEART CATH AND CORONARY ANGIOGRAPHY;  Surgeon: Yvonne Kendall, MD;  Location: [facility] INVASIVE CV LAB;  Service: Cardiovascular;  Laterality: N/A;   LIVER BIOPSY     ORIF ULNAR FRACTURE Left 05/18/2017   Procedure: OPEN REDUCTION INTERNAL FIXATION (ORIF) ULNAR FRACTURE;  Surgeon: Bradly Bienenstock, MD;  Location: [facility] OR;  Service: Orthopedics;  Laterality: Left;   PERIPHERAL VASCULAR INTERVENTION Right 06/15/2020   Procedure: PERIPHERAL VASCULAR INTERVENTION;  Surgeon: Maeola Harman, MD;  Location: [facility] INVASIVE CV LAB;  Service: Cardiovascular;  Laterality: Right;   POLYPECTOMY  08/21/2018   Procedure: POLYPECTOMY;  Surgeon: Benancio Deeds, MD;  Location: [facility] ENDOSCOPY;  Service: Gastroenterology;;   Betsey Amen N/A 12/11/2012   Procedure: Betsey Amen;  Surgeon: Nada Libman, MD;  Location: [facility] CATH LAB;  Service:  Cardiovascular;  Laterality: N/A;   WOUND EXPLORATION Left 06/15/2020   Procedure: GROIN  EXPLORATION;  Surgeon: Maeola Harman, MD;  Location: [facility] OR;  Service: Vascular;  Laterality: Left;     reports that he has been smoking cigarettes. He has a 5.00 pack-year smoking history. He has never used smokeless tobacco. He reports current alcohol use of about 10.0 standard drinks per week. He reports current drug use. Drug: Cocaine.  No Known Allergies  Family History  Problem Relation Age of Onset   Diabetes Father    Hypertension Father    Heart disease Father      Prior to Admission medications   Medication Sig Start Date End Date Taking? Authorizing Provider  acetaminophen (TYLENOL) 325 MG tablet Take 2 tablets (650 mg total) by mouth every 6 (six) hours as needed for mild pain (or Fever >/= 101). 09/17/21   Marguerita Merles Latif, DO  albuterol (VENTOLIN HFA) 108 (90 Base) MCG/ACT inhaler Inhale 2 puffs into the lungs every 4 (four) hours as needed for shortness of breath or wheezing. 11/06/19   [provider]  amiodarone (PACERONE) 200 MG tablet Take 200 mg by mouth daily. 06/17/21   [provider]  amLODipine (NORVASC) 5 MG tablet Take 5 mg by mouth at bedtime. 07/22/21   [provider]  Virgel Bouquet  ELLIPTA 200-25 MCG/ACT AEPB Inhale 1 puff into the lungs daily. 09/17/21   [provider]  calcitRIOL (ROCALTROL) 0.5 MCG capsule Take 4 capsules (2 mcg total) by mouth Every Tuesday,Thursday,and Saturday with dialysis. 05/27/21   Park Pope, MD  carvedilol (COREG) 12.5 MG tablet Take 12.5 mg by mouth 2 (two) times daily. 07/06/21   [provider]  cinacalcet (SENSIPAR) 30 MG tablet Take 6 tablets (180 mg total) by mouth Every Tuesday,Thursday,and Saturday with dialysis. 12/12/20   Almon Hercules, MD  Darbepoetin Alfa (ARANESP) 200 MCG/0.4ML SOSY injection Inject 0.4 mLs (200 mcg total) into the vein every Tuesday with hemodialysis. 05/25/21   Park Pope, MD  folic acid (FOLVITE) 1 MG tablet Take 1 tablet (1 mg total) by mouth daily. 09/17/21   Sheikh, Omair Latif, DO  mometasone-formoterol (DULERA) 200-5 MCG/ACT AERO Inhale 2 puffs into the lungs 2 (two) times daily. 09/17/21   Marguerita Merles Latif, DO  multivitamin (RENA-VIT) TABS tablet Take 1 tablet by mouth at bedtime. 09/17/21   Sheikh, Omair Latif, DO  nicotine (NICODERM CQ - DOSED IN MG/24 HOURS) 14 mg/24hr patch Place 1 patch (14 mg total) onto the skin daily. 09/17/21   Marguerita Merles Latif, DO  pantoprazole (PROTONIX) 40 MG tablet Take 1 tablet (40 mg total) by mouth 2 (two) times daily before a meal. 09/17/21   Sheikh, Kateri Mc Latif, DO  polyethylene glycol powder (GLYCOLAX/MIRALAX) 17 GM/SCOOP powder Take 17 g by mouth daily as needed for mild constipation. 09/17/21   Marguerita Merles Latif, DO  rosuvastatin (CRESTOR) 10 MG tablet Take 1 tablet (10 mg total) by mouth daily. 12/11/20 12/18/21  Almon Hercules, MD  sevelamer carbonate (RENVELA) 800 MG tablet Take 5 tablets (4,000 mg total) by mouth 3 (three) times daily with meals. 09/17/21   Marguerita Merles Latif, DO  thiamine 100 MG tablet Take 1 tablet (100 mg total) by mouth daily. 09/17/21   Sheikh, Omair Latif, DO  umeclidinium bromide (INCRUSE ELLIPTA) 62.5 MCG/ACT AEPB Inhale 1 puff into the lungs daily. 09/17/21   Marguerita Merles Latif, DO  apixaban (ELIQUIS) 5 MG TABS tablet Take 1 tablet (5 mg total) by mouth 2 (two) times daily. 06/22/20 08/03/20  Graceann Congress, PA-C    Physical Exam: Vitals:   11/11/21 1000 11/11/21 1154 11/11/21 1230  BP: (!) 184/100 (!) 161/92 (!) 181/99  Pulse:  79 83  Resp: 19 13 (!) 22  Temp: 98 F (36.7 C)    SpO2: 97% 97% 98%    Constitutional: NAD, calm, comfortable Vitals:   11/11/21 1000 11/11/21 1154 11/11/21 1230  BP: (!) 184/100 (!) 161/92 (!) 181/99  Pulse:  79 83  Resp: 19 13 (!) 22  Temp: 98 F (36.7 C)    SpO2: 97% 97% 98%   Eyes: PERRL, lids and conjunctivae normal ENMT: Mucous membranes  are moist. Posterior pharynx clear of any exudate or lesions.Normal dentition.  Neck: normal, supple, no masses, no thyromegaly Respiratory: clear to auscultation bilaterally, no wheezing, fine crackles on bilateral bases.  Increasing respiratory effort. No accessory muscle use.  Cardiovascular: Regular rate and rhythm, no murmurs / rubs / gallops. No extremity edema. 2+ pedal pulses. No carotid bruits.  Abdomen: no tenderness, no masses palpated. No hepatosplenomegaly. Bowel sounds positive.  Musculoskeletal: no clubbing / cyanosis. No joint deformity upper and lower extremities. Good ROM, no contractures. Normal muscle tone.  Skin: no rashes, lesions, ulcers. No induration Neurologic: CN 2-12 grossly intact. Sensation intact, DTR normal. Strength 5/5  in all 4.  Psychiatric: Awake alert, oriented to person and place, confused about time    Labs on Admission: I have personally reviewed following labs and imaging studies  CBC: Recent Labs  Lab 11/11/21 1034  WBC 3.7*  NEUTROABS 2.3  HGB 8.1*  HCT 26.2*  MCV 99.6  PLT 158   Basic Metabolic Panel: Recent Labs  Lab 11/11/21 1034  NA PENDING  K 7.4*  CL PENDING  CO2 PENDING  GLUCOSE 90  BUN 178*  CREATININE 28.39*  CALCIUM 9.6  MG 2.2   GFR: CrCl cannot be calculated (Unknown ideal weight.). Liver Function Tests: Recent Labs  Lab 11/11/21 1034  AST PENDING  ALT 7  ALKPHOS 47  BILITOT 0.4  PROT 7.0  ALBUMIN 2.8*   No results for input(s): LIPASE, AMYLASE in the last 168 hours. No results for input(s): AMMONIA in the last 168 hours. Coagulation Profile: No results for input(s): INR, PROTIME in the last 168 hours. Cardiac Enzymes: No results for input(s): CKTOTAL, CKMB, CKMBINDEX, TROPONINI in the last 168 hours. BNP (last 3 results) No results for input(s): PROBNP in the last 8760 hours. HbA1C: No results for input(s): HGBA1C in the last 72 hours. CBG: Recent Labs  Lab 11/11/21 1152  GLUCAP 64*   Lipid  Profile: No results for input(s): CHOL, HDL, LDLCALC, TRIG, CHOLHDL, LDLDIRECT in the last 72 hours. Thyroid Function Tests: No results for input(s): TSH, T4TOTAL, FREET4, T3FREE, THYROIDAB in the last 72 hours. Anemia Panel: No results for input(s): VITAMINB12, FOLATE, FERRITIN, TIBC, IRON, RETICCTPCT in the last 72 hours. Urine analysis:    Component Value Date/Time   COLORURINE YELLOW 05/08/2010 1335   APPEARANCEUR CLEAR 05/08/2010 1335   LABSPEC 1.009 05/08/2010 1335   PHURINE 6.0 05/08/2010 1335   GLUCOSEU NEGATIVE 05/08/2010 1335   HGBUR NEGATIVE 05/08/2010 1335   BILIRUBINUR NEGATIVE 05/08/2010 1335   KETONESUR NEGATIVE 05/08/2010 1335   PROTEINUR 100 (A) 05/08/2010 1335   UROBILINOGEN 0.2 05/08/2010 1335   NITRITE NEGATIVE 05/08/2010 1335   LEUKOCYTESUR SMALL (A) 05/08/2010 1335    Radiological Exams on Admission: DG Chest Portable 1 View  Result Date: 11/11/2021 CLINICAL DATA:  Chest pain. EXAM: PORTABLE CHEST 1 VIEW COMPARISON:  Chest XR, 10/14/2021.  CT chest, 08/10/2018. FINDINGS: Cardiomediastinal silhouette is enlarged. Aortic aortic arch calcifications. RIGHT basilar nodularity is suspected to represent a nipple shadow. Trace streaky bibasilar opacities without focal consolidation. No pleural effusion or pneumothorax. No acute displaced fracture. IMPRESSION: 1. Cardiomegaly and minimal basilar atelectasis. 2.  Aortic Atherosclerosis (ICD10-I70.0). Electronically Signed   By: Roanna Banning M.D.   On: 11/11/2021 10:15    EKG: Independently reviewed.  Sinus, chronic T wave morphology changes as before.  Assessment/Plan Principal Problem:   CHF (congestive heart failure) (HCC) Active Problems:   Atypical chest pain   COPD (chronic obstructive pulmonary disease) (HCC)   Acute pulmonary edema (HCC)   Anemia due to chronic kidney disease, on chronic dialysis (HCC)   Acute metabolic encephalopathy   HFrEF (heart failure with reduced ejection fraction) (HCC)   ESRD (end  stage renal disease) on dialysis (HCC)  (please populate well all problems here in Problem List. (For example, if patient is on BP meds at home and you resume or decide to hold them, it is a problem that needs to be her. Same for CAD, COPD, HLD and so on)   Chronic diastolic CHF decompensation -Significant fluid overload, etiology secondary to noncompliant with hemodialysis and noncompliant with BP/CHF medications. -Emergency  dialysis -Plan to interview patient again after mentation improved.  As it appears that patient has had frequent such episodes of missing dialysis recently.  According to care everywhere, patient was last seen by his nephrology on April 20th and patient has been compliant to that date.  Hyperkalemia -No significant EKG changes -Emergency HD. -Hyperkalemia cocktail including dextrose, insulin and calcium gluconate and 1 dose of Lokelma given in ED.  Acute uremic encephalopathy -Secondary to worsening of uremia -Emergency dialysis  Atypical chest pain with history of nonobstructive CAD -Likely secondary to cocaine abuse -Ativan 1 mg twice daily x1 day -Continue Coreg -Start ASA 81 mg daily  HTN emergency -Secondary to noncompliance with BP meds -Resume home dose of Coreg, amlodipine  PAF -In sinus rhythm, continue Coreg -Not on systemic anticoagulation due to history of severe GI bleed.  Chronic anemia secondary to ESRD -H&H stable -On outpatient EPO therapy  Hx of IIDM -Recent A1C=4.6 -Hold off DM meds  COPD -Stable, continue home breathing meds.   DVT prophylaxis: Heparin subQ Code Status: Full code Family Communication: None at bedside Disposition Plan: Patient is sick with CHF decompensation secondary to noncompliant with HD significant fluid overload and hyperkalemia, expect series of inpatient HD to correct volume status, expect more than 2 midnight hospital stay. Consults called: Nephrology Admission status: Tele admit   Emeline General  MD [facility] Pager [phone]  11/11/2021, 12:36 PM

## 2021-11-11 NOTE — ED Notes (Signed)
[facility] notified for transfer ? ?

## 2021-11-11 NOTE — Discharge Instructions (Addendum)
You were seen today for chest pain, sob and abdominal pain.  ?Given your history and exam you need to go to the ER for further evaluation.  ?

## 2021-11-11 NOTE — Procedures (Signed)
Pt arrived from ED asleep with BP 175/108, tolerated hemodialysis but was difficult to arouse following treatment.  Called RN from  [facility] to assist checking a blood sugar as none of the badges in hemodialysis work for blood sugar checks.  Blood sugar was noted to be 45 after his hemodialysis treatment.  [facility] MD notified and order received for 0.5 amp of d50 to be given IV.   Notified Annalee Genta his receiving Physiological scientist.  She stated it would be ok to transport to [facility] and that she would follow up on his blood sugar.  BP following hemodialysis was 160/90 and 3.5L UF removal was noted.   ? ? ?

## 2021-11-11 NOTE — ED Triage Notes (Signed)
Pt c/o center chest pain and SOB x2 days. States hasn't had dialysis x2wks d/t no transportation and being homeless. No distress noted, speaking in complete sentences. ?

## 2021-11-11 NOTE — ED Notes (Signed)
Patient is being discharged from the Urgent Care and sent to the Emergency Department via [facility] . Per MD, patient is in need of higher level of care due to Chest Pain. Patient is aware and verbalizes understanding of plan of care.  ?Vitals:  ? 11/11/21 0849  ?BP: (!) 191/96  ?Pulse: 89  ?Resp: 20  ?Temp: 98.1 ?F (36.7 ?C)  ?SpO2: 100%  ?  ?

## 2021-11-11 NOTE — ED Provider Notes (Signed)
?[facility] ?Provider Note ? ?CSN: [id number] ?Arrival date & time: 11/11/21 0944 ? ?Chief Complaint(s) ?Chest Pain ? ?HPI ?Raymond [name] is a 66 y.o. male with PMH ESRD on hemodialysis Monday Wednesday Friday, COPD, CAD, treated hepatitis C, thyroid disease, T2DM, polysubstance abuse who presents emergency department for evaluation of chest pain, shortness of breath, abdominal pain and muscle twitching.  Patient states that he has not had dialysis in 2 weeks.  He states that he started to feel bad approximately 2 or 3 days ago and had associated chest pain that brought him to urgent care today.  Patient then transferred to the emergency department for chest pain work-up.  Per EMS, patient with peak T waves but no significant bradycardia.Patient states that he ate last used crack cocaine yesterday. ? ? ?Past Medical History ?Past Medical History:  ?Diagnosis Date  ? Anemia   ? CAD (coronary artery disease)   ? a. 03/2018: cath showing 20 to 30% stenosis along the LAD, luminal irregularities along the RCA, and angiographically normal LCx  ? COPD (chronic obstructive pulmonary disease) ([place])   ? ESRD (end stage renal disease) on dialysis [facility])   ? "MWF; [facility]" (07/22/17)  ? Hepatitis C   ? Still positive s/p liver biopsy at [facility]  and interferon therapy for 6 months. Most recent lab work was on 10/24/12  ? Hepatitis C   ? "took the tx; gone now" (12/05/2016)  Was treated  ? History of blood transfusion ~ 2012/2013  ? "related to my kidneys; blood was low"  ? Hypertension   ? Peripheral arterial disease ([place])   ? Substance abuse ([place])   ? Thyroid disease   ? Type 2 diabetes mellitus with other diabetic kidney complication ([place]) 6/57/8469  ? ?Patient Active Problem List  ? Diagnosis Date Noted  ? Thrombocytopenia ([place]) 09/15/2021  ? Hypertensive urgency 09/13/2021  ? Uncontrolled hypertension   ? Uremia   ? Severe episode of recurrent major depressive disorder, without psychotic  features ([place])   ? ESRD (end stage renal disease) on dialysis ([place]) 05/18/2021  ? Cardiac arrest ([place]) 04/03/2021  ? Anisocoria 04/03/2021  ? HFrEF (heart failure with reduced ejection fraction) ([place]) 04/03/2021  ? Hypothermia not due to cold exposure 04/03/2021  ? Junctional bradycardia 04/03/2021  ? Pancytopenia ([place]) 01/25/2021  ? AMS (altered mental status) 01/19/2021  ? Chronic viral hepatitis C ([place]) 12/25/2020  ? 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 ([place]) 08/05/2020  ? Acute upper GI bleed 07/29/2020  ? Acute metabolic encephalopathy 62/95/2841  ? COVID-19 virus infection 07/29/2020  ? AVM (arteriovenous malformation) of stomach, acquired with hemorrhage   ? AVM (arteriovenous malformation) of small bowel, acquired with hemorrhage   ? Current use of long term anticoagulation   ? Sensorineural hearing loss (SNHL) of both ears 03/30/2020  ? History of arteriovenous malformation (AVM) 01/14/2020  ? UGIB (upper gastrointestinal bleed) 01/14/2020  ? Acute blood loss anemia 01/14/2020  ? Elevated troponin 01/14/2020  ? Allergy, unspecified, initial encounter 04/29/2019  ? Gastroenteritis 12/24/2018  ? Substance use disorder 12/24/2018  ? Type 2 diabetes mellitus with other diabetic kidney complication ([place]) 32/44/0102  ? Noncompliance of patient with renal dialysis 09/12/2018  ? Benign neoplasm of colon   ? Melena   ? Anemia due to chronic kidney disease, on chronic dialysis ([place])   ? Anemia   ? H/O medication noncompliance   ? Polysubstance (excluding opioids) dependence ([place])   ? Atrial  flutter with rapid ventricular response ([place]) 08/10/2018  ? Chest pain 08/10/2018  ? PAF (paroxysmal atrial fibrillation) ([place]) 08/01/2018  ? Acute respiratory failure with hypoxia ([place]) 07/13/2018  ? Non-compliance with renal dialysis 07/13/2018  ? PAD (peripheral artery disease) ([place]) 04/11/2018  ? Sepsis ([place]) 03/31/2018  ? Preop cardiovascular exam 03/29/2018  ? Idiopathic gout, unspecified site 12/13/2017  ?  Other specified complication of vascular prosthetic devices, implants and grafts, initial encounter ([place]) 11/03/2017  ? Volume overload 08/07/2017  ? Acute pulmonary edema (HCC)   ? Dyspnea 06/20/2017  ? Acute bronchitis   ? COPD (chronic obstructive pulmonary disease) ([place])   ? ESRD on hemodialysis ([place])   ? Hypoxia 12/05/2016  ? Hyperkalemia 12/19/2015  ? Hypoglycemia 12/19/2015  ? Polysubstance abuse ([place]) 12/19/2015  ? Homeless 12/19/2015  ? Anemia associated with stage 5 chronic renal failure ([place]) 12/19/2015  ? BPH without urinary obstruction 10/29/2015  ? Erectile dysfunction 10/29/2015  ? Hematuria, gross 10/29/2015  ? Hypercalcemia 11/07/2014  ? Pruritus, unspecified 10/17/2014  ? Coagulation defect, unspecified ([place]) 09/17/2014  ? Atypical chest pain 12/19/2013  ? Thrombocytopenia, unspecified ([place]) 10/17/2013  ? Iron deficiency anemia, unspecified 08/28/2013  ? Atherosclerosis of native arteries of extremity with intermittent claudication ([place]) 12/04/2012  ? ESRD (end stage renal disease) ([place]) 12/04/2012  ? Hypertensive chronic kidney disease with stage 5 chronic kidney disease or end stage renal disease ([place]) 11/20/2012  ? Secondary hyperparathyroidism of renal origin ([place]) 11/20/2012  ? HEPATITIS C 09/07/2006  ? HYPERCHOLESTEROLEMIA 09/07/2006  ? HYPERTENSION, BENIGN SYSTEMIC 09/07/2006  ? ?Home Medication(s) ?Prior to Admission medications   ?Medication Sig Start Date End Date Taking? Authorizing Provider  ?acetaminophen (TYLENOL) 325 MG tablet Take 2 tablets (650 mg total) by mouth every 6 (six) hours as needed for mild pain (or Fever >/= 101). 09/17/21   Raiford Noble Latif, DO  ?albuterol (VENTOLIN HFA) 108 (90 Base) MCG/ACT inhaler Inhale 2 puffs into the lungs every 4 (four) hours as needed for shortness of breath or wheezing. 11/06/19   [provider]  ?amiodarone (PACERONE) 200 MG tablet Take 200 mg by mouth daily. 06/17/21   [provider]  ?amLODipine (NORVASC) 5 MG tablet  Take 5 mg by mouth at bedtime. 07/22/21   [provider]  ?Adair Patter 200-25 MCG/ACT AEPB Inhale 1 puff into the lungs daily. 09/17/21   [provider]  ?calcitRIOL (ROCALTROL) 0.5 MCG capsule Take 4 capsules (2 mcg total) by mouth Every Tuesday,Thursday,and Saturday with dialysis. 05/27/21   France Ravens, MD  ?carvedilol (COREG) 12.5 MG tablet Take 12.5 mg by mouth 2 (two) times daily. 07/06/21   [provider]  ?cinacalcet (SENSIPAR) 30 MG tablet Take 6 tablets (180 mg total) by mouth Every Tuesday,Thursday,and Saturday with dialysis. 12/12/20   Mercy Riding, MD  ?Darbepoetin Alfa (ARANESP) 200 MCG/0.4ML SOSY injection Inject 0.4 mLs (200 mcg total) into the vein every Tuesday with hemodialysis. 05/25/21   France Ravens, MD  ?folic acid (FOLVITE) 1 MG tablet Take 1 tablet (1 mg total) by mouth daily. 09/17/21   Raiford Noble Latif, DO  ?mometasone-formoterol (DULERA) 200-5 MCG/ACT AERO Inhale 2 puffs into the lungs 2 (two) times daily. 09/17/21   Kerney Elbe, DO  ?multivitamin (RENA-VIT) TABS tablet Take 1 tablet by mouth at bedtime. 09/17/21   Raiford Noble Latif, DO  ?nicotine (NICODERM CQ - DOSED IN MG/24 HOURS) 14 mg/24hr patch Place 1 patch (14 mg total) onto the skin daily. 09/17/21   Alfredia Ferguson,  Bertram Savin, DO  ?pantoprazole (PROTONIX) 40 MG tablet Take 1 tablet (40 mg total) by mouth 2 (two) times daily before a meal. 09/17/21   Sheikh, Georgina Quint Latif, DO  ?polyethylene glycol powder (GLYCOLAX/MIRALAX) 17 GM/SCOOP powder Take 17 g by mouth daily as needed for mild constipation. 09/17/21   Raiford Noble Latif, DO  ?rosuvastatin (CRESTOR) 10 MG tablet Take 1 tablet (10 mg total) by mouth daily. 12/11/20 12/18/21  Mercy Riding, MD  ?sevelamer carbonate (RENVELA) 800 MG tablet Take 5 tablets (4,000 mg total) by mouth 3 (three) times daily with meals. 09/17/21   Raiford Noble Latif, DO  ?thiamine 100 MG tablet Take 1 tablet (100 mg total) by mouth daily. 09/17/21   Raiford Noble Latif, DO   ?umeclidinium bromide (INCRUSE ELLIPTA) 62.5 MCG/ACT AEPB Inhale 1 puff into the lungs daily. 09/17/21   Raiford Noble Latif, DO  ?apixaban (ELIQUIS) 5 MG TABS tablet Take 1 tablet (5 mg total) by mouth 2 (two)

## 2021-11-12 DIAGNOSIS — F1910 Other psychoactive substance abuse, uncomplicated: Secondary | ICD-10-CM | POA: Diagnosis present

## 2021-11-12 DIAGNOSIS — N186 End stage renal disease: Secondary | ICD-10-CM

## 2021-11-12 DIAGNOSIS — I5033 Acute on chronic diastolic (congestive) heart failure: Secondary | ICD-10-CM | POA: Diagnosis not present

## 2021-11-12 DIAGNOSIS — Z992 Dependence on renal dialysis: Secondary | ICD-10-CM

## 2021-11-12 DIAGNOSIS — G9341 Metabolic encephalopathy: Secondary | ICD-10-CM | POA: Diagnosis not present

## 2021-11-12 DIAGNOSIS — J449 Chronic obstructive pulmonary disease, unspecified: Secondary | ICD-10-CM | POA: Diagnosis not present

## 2021-11-12 LAB — BASIC METABOLIC PANEL
Anion gap: 13 (ref 5–15)
BUN: 53 mg/dL — ABNORMAL HIGH (ref 8–23)
CO2: 26 mmol/L (ref 22–32)
Calcium: 9.6 mg/dL (ref 8.9–10.3)
Chloride: 98 mmol/L (ref 98–111)
Creatinine, Ser: 12.41 mg/dL — ABNORMAL HIGH (ref 0.61–1.24)
GFR, Estimated: 4 mL/min — ABNORMAL LOW (ref >60)
Glucose, Bld: 70 mg/dL (ref 70–99)
Potassium: 4.9 mmol/L (ref 3.5–5.1)
Sodium: 137 mmol/L (ref 135–145)

## 2021-11-12 LAB — GLUCOSE, CAPILLARY: Glucose-Capillary: 88 mg/dL (ref 70–99)

## 2021-11-12 LAB — TROPONIN I (HIGH SENSITIVITY): Troponin I (High Sensitivity): 93 ng/L — ABNORMAL HIGH (ref <18)

## 2021-11-12 LAB — HEPATITIS B SURFACE ANTIBODY, QUANTITATIVE: Hep B S AB Quant (Post): >1000 m[IU]/mL (ref >9.9)

## 2021-11-12 MED ORDER — CHLORHEXIDINE GLUCONATE CLOTH 2 % EX PADS: 6.0000 | MEDICATED_PAD | Freq: Every day | CUTANEOUS | Status: DC

## 2021-11-12 MED ORDER — LIDOCAINE-PRILOCAINE 2.5-2.5 % EX CREA: 1.0000 "application " | TOPICAL_CREAM | CUTANEOUS | Status: DC | PRN

## 2021-11-12 MED ORDER — LIDOCAINE HCL (PF) 1 % IJ SOLN: 5.0000 mL | INTRAMUSCULAR | Status: DC | PRN

## 2021-11-12 MED ORDER — HEPARIN SODIUM (PORCINE) 1000 UNIT/ML DIALYSIS: 1000.0000 [IU] | INTRAMUSCULAR | Status: DC | PRN

## 2021-11-12 MED ORDER — PENTAFLUOROPROP-TETRAFLUOROETH EX AERO: 1.0000 "application " | INHALATION_SPRAY | CUTANEOUS | Status: DC | PRN

## 2021-11-12 MED ORDER — SODIUM CHLORIDE 0.9 % IV SOLN: 100.0000 mL | INTRAVENOUS | Status: DC | PRN

## 2021-11-12 MED ORDER — ALTEPLASE 2 MG IJ SOLR: 2.0000 mg | Freq: Once | INTRAMUSCULAR | Status: DC | PRN

## 2021-11-12 NOTE — Progress Notes (Signed)
?Progress Note ? ? ?PatientRUNE [name] JXB:[id number] DOB: 07-25-1955 DOA: 11/11/2021     1 ?DOS: the patient was seen and examined on 11/12/2021 ?  ?Brief hospital course: ?Raymond [name] was admitted to the hospital with the working diagnosis of volume overload in the setting of ESRD.  ? ?66 yo male with the past medical history of ESRD on HD, T/TH/Sat heart failure, COPD, paroxysmal atrial fibrillation who presented with dyspnea and chest pain. Apparently patient has missed 2 weeks of hemodialysis. Continue use crack cocaine. On the day of hospitalization he suffered acute chest pain that prompted him to go to urgent care. He received SL nitroglycerin and referred to the ED. Blood pressure 184/100. HR 79, RR 13, 02 saturation 97%, lungs with rales bilaterally with increased work of breathing, heart with S1 and S2 present and rhythmic, abdomen soft and no lower extremity edema. Positive confusion.  ? ?Na 143, K 7,4 CL 99, bicarbonate 20, glucose 90, bun 178 cr 28,3  ?High sensitive troponin 51  ?Wbc 3,7 hgb 8,1, plt 158  ? ?Chest radiograph with cardiomegaly and bilateral hilar vascular congestion, no infiltrates or effusions.  ? ?EKG 83 bpm, normal axis, normal intervals, sinus rhythm with right atrial enlargement, no significant ST segment or T wave changes. Positive LVH.  ? ?Patient underwent emergent hemodialysis with ultrafiltration 3,5 L with improvement in volume status but not back to baseline.  ? ?Plan for HD on 05/06  ? ?Assessment and Plan: ?* ESRD (end stage renal disease) on dialysis [facility]) ?ESRD on HD, hyperkalemia, volume overload and acute pulmonary edema.  ? ?Patient with improvement in volume status after yesterday hemodialysis and ultrafiltration  ? ?K is 4,9 and serum bicarbonate at 26 with BUN 53  ? ?Plan to continue renal replacement therapy tomorrow. ?Close follow up electrolytes and blood pressure.  ? ?Acute on chronic diastolic CHF (congestive heart failure) (HCC) ?Decompensation due to renal  failure and volume overload.  ? ?Echocardiogram from 2022 with preserved LV systolic function, moderate TR. Moderate elevation in pulmonary artery systolic pressure.  ? ?Continue blood pressure control and ultrafiltration with HD.  ? ?Acute metabolic encephalopathy ?Uremic encephalopathy. ?BUN has improved after HD. ?Encephalopathy has resolved.  ? ?COPD (chronic obstructive pulmonary disease) (HCC) ?No clinical signs of exacerbation ? ? ?Anemia due to chronic kidney disease, on chronic dialysis (HCC) ?Stable Hgb and hct  ? ?Substance abuse (HCC) ?Patient with no clinical signs of withdrawal. ?Currently is homeless, will need to follow up with social services before discharge.  ? ? ? ? ?  ? ?Subjective: patient is feeling better, but continue to be very weak and deconditioned  ? ?Physical Exam: ?Vitals:  ? 11/12/21 0602 11/12/21 0824 11/12/21 0910 11/12/21 1656  ?BP: (!) 166/77 (!) 156/102  (!) 150/69  ?Pulse: 90 87  84  ?Resp: 18 18  18   ?Temp: 99 ?F (37.2 ?C) 98.1 ?F (36.7 ?C)  98.7 ?F (37.1 ?C)  ?TempSrc: Oral Oral    ?SpO2: 94% 92% 93% 94%  ?Weight:      ? ?Neurology awake and alert ?ENT with no pallor ?Cardiovascular with S1 and S2 present and rhythmic with no gallops, rubs or murmurs ?Respiratory with no rales or wheezing ?Abdomen not distended  ?No lower extremity edema  ?Data Reviewed: ? ? ? ?Family Communication: no family at the bedside  ? ?Disposition: ?Status is: Inpatient ?Remains inpatient appropriate because: renal failure  ? Planned Discharge Destination: Skilled nursing facility ? ? ? ? ?Author: ?Raymond Laski  Annett Gula, MD ?11/12/2021 4:59 PM ? ?For on call review [email or web address].  ?

## 2021-11-12 NOTE — Assessment & Plan Note (Signed)
Patient with no clinical signs of withdrawal. ?Currently is homeless, will need to follow up with social services before discharge.  ?

## 2021-11-12 NOTE — Assessment & Plan Note (Addendum)
ESRD on HD, hyperkalemia, volume overload and acute pulmonary edema, Hyponatremia.  ? ?Patient clinically euvolemic. ?K is controlled at 4.6 and serum bicarbonate at 26 with Na 135/  ? ?Continue HD per nephrology recommendations. ?Patient with homeless and will need place to go before discharge.  ? ?Anemia of chronic renal disease, continue with epo ?Metabolic bone disease continue with cacitriol and sensipar.  ?Acute anemia and thrombocytopenia.  ?Persistent anemia after 1 unit PRBC, hgb today is 6.9 with Plt 140. ?Plan for second unit PRBC transfusion during hemodialysis to prevent volume overload.  ? ?Continue to be afebrile, blood cultures with no growth, continue to hold on antibiotic therapy.  ? ? ?

## 2021-11-12 NOTE — Progress Notes (Signed)
HOSPITAL MEDICINE OVERNIGHT EVENT NOTE   ? ?Nursing reports troponin of 74, up from 51 on admission. ? ?Patient denies any chest pain.  Telemetry exhibits no ST segment change.  Twelve-lead EKG repeated which additionally reveals no ST segment change. ? ?Troponin elevation likely secondary to supply/demand mismatch in the second morning of acute diastolic congestive heart failure. ? ?Continue to monitor on telemetry, will obtain repeat troponin in approximately 6 hours to trend to peak. ? ?Vernelle Emerald  MD ?[facility]  ? ? ? ? ? ? ? ? ? ? ?

## 2021-11-12 NOTE — Progress Notes (Signed)
NEW ADMISSION NOTE ?New Admission Note:  ? ?Arrival Method: Stretcher from Dialysis ?Mental Orientation: Alert to self. Disoriented to place, situation, and time. ?Telemetry: 5M11 ?Assessment: Completed ?Skin: Intact ?IV:  Right Forearm, Saline Locked ?Pain:0/10, Pt. Does not state any pain ?Tubes: None ?Safety Measures: Safety Fall Prevention Plan has been given, discussed and signed ?Admission: Initiated ?[facility] [place] Orientation: Patient has been orientated to the room, unit and staff.  ?Family: Not present at the bedside. ? ?Orders have been reviewed and implemented. Will continue to monitor the patient. Call light has been placed within reach and bed alarm has been activated.  ? ?Sharmon Revere, RN   ?

## 2021-11-12 NOTE — TOC Progression Note (Signed)
Transition of Care (TOC) - Initial/Assessment Note  ? ? ?Patient Details  ?Name: Raymond [name] ?MRN: [id number] ?Date of Birth: Feb 12, 1956 ? ?Transition of Care (TOC) CM/SW Contact:    ?Catalina Pizza Jacques Willingham, LCSWA ?Phone Number: ?11/12/2021, 4:18 PM ? ?Clinical Narrative:                 ?CSW received consult for SNF placement.  PT and OT assessments and recommendations are needed to begin SNF process. ? ?TOC following. ? ?  ?  ? ? ?Patient Goals and CMS Choice ?  ?  ?  ? ?Expected Discharge Plan and Services ?  ?  ?  ?  ?  ?                ?  ?  ?  ?  ?  ?  ?  ?  ?  ?  ? ?Prior Living Arrangements/Services ?  ?  ?  ?       ?  ?  ?  ?  ? ?Activities of Daily Living ?  ?  ? ?Permission Sought/Granted ?  ?  ?   ?   ?   ?   ? ?Emotional Assessment ?  ?  ?  ?  ?  ?  ? ?Admission diagnosis:  Hyperkalemia [E87.5] ?CHF (congestive heart failure) (HCC) [I50.9] ?Uremia [N19] ?Patient Active Problem List  ? Diagnosis Date Noted  ? CHF (congestive heart failure) (HCC) 11/11/2021  ? Thrombocytopenia (HCC) 09/15/2021  ? Hypertensive urgency 09/13/2021  ? Uncontrolled hypertension   ? Uremia   ? Severe episode of recurrent major depressive disorder, without psychotic features (HCC)   ? ESRD (end stage renal disease) on dialysis (HCC) 05/18/2021  ? Cardiac arrest (HCC) 04/03/2021  ? Anisocoria 04/03/2021  ? HFrEF (heart failure with reduced ejection fraction) (HCC) 04/03/2021  ? Hypothermia not due to cold exposure 04/03/2021  ? Junctional bradycardia 04/03/2021  ? Pancytopenia (HCC) 01/25/2021  ? AMS (altered mental status) 01/19/2021  ? 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 Acute upper GI bleed 07/29/2020  ? Acute metabolic encephalopathy 07/29/2020  ? COVID-19 virus infection 07/29/2020  ? AVM (arteriovenous malformation) of stomach, acquired with hemorrhage   ? AVM (arteriovenous malformation) of small bowel, acquired with hemorrhage   ? Current use of long  term anticoagulation   ? Sensorineural hearing loss (SNHL) of both ears 03/30/2020  ? History of arteriovenous malformation (AVM) 01/14/2020  ? UGIB (upper gastrointestinal bleed) 01/14/2020  ? Acute blood loss anemia 01/14/2020  ? Elevated troponin 01/14/2020  ? Allergy, unspecified, initial encounter 04/29/2019  ? Gastroenteritis 12/24/2018  ? Substance use disorder 12/24/2018  ? Type 2 diabetes mellitus with other diabetic kidney complication (HCC) 09/24/2018  ? Noncompliance of patient with renal dialysis 09/12/2018  ? Benign neoplasm of colon   ? Melena   ? Anemia due to chronic kidney disease, on chronic dialysis (HCC)   ? Anemia   ? H/O medication noncompliance   ? Polysubstance (excluding opioids) dependence (HCC)   ? Atrial flutter with rapid ventricular response (HCC) 08/10/2018  ? Chest pain 08/10/2018  ? PAF (paroxysmal atrial fibrillation) (HCC) 08/01/2018  ? Acute respiratory failure with hypoxia (HCC) 07/13/2018  ? Non-compliance with renal dialysis 07/13/2018  ? PAD (peripheral artery disease) (HCC) 04/11/2018  ? Sepsis (HCC) 03/31/2018  ? Preop cardiovascular exam 03/29/2018  ? Idiopathic gout, unspecified site 12/13/2017  ?  Other specified complication of vascular prosthetic devices, implants and grafts, initial encounter (HCC) 11/03/2017  ? Volume overload 08/07/2017  ? Acute pulmonary edema (HCC)   ? Dyspnea 06/20/2017  ? Acute bronchitis   ? COPD (chronic obstructive pulmonary disease) (HCC)   ? ESRD on hemodialysis (HCC)   ? Hypoxia 12/05/2016  ? Hyperkalemia 12/19/2015  ? Hypoglycemia 12/19/2015  ? Polysubstance abuse (HCC) 12/19/2015  ? Homeless 12/19/2015  ? Anemia associated with stage 5 chronic renal failure (HCC) 12/19/2015  ? BPH without urinary obstruction 10/29/2015  ? Erectile dysfunction 10/29/2015  ? Hematuria, gross 10/29/2015  ? Hypercalcemia 11/07/2014  ? Pruritus, unspecified 10/17/2014  ? Coagulation defect, unspecified (HCC) 09/17/2014  ? Atypical chest pain 12/19/2013  ?  Thrombocytopenia, unspecified (HCC) 10/17/2013  ? Iron deficiency anemia, unspecified 08/28/2013  ? Atherosclerosis of native arteries of extremity with intermittent claudication (HCC) 12/04/2012  ? ESRD (end stage renal disease) (HCC) 12/04/2012  ? Hypertensive chronic kidney disease with stage 5 chronic kidney disease or end stage renal disease (HCC) 11/20/2012  ? Secondary hyperparathyroidism of renal origin (HCC) 11/20/2012  ? HEPATITIS C 09/07/2006  ? HYPERCHOLESTEROLEMIA 09/07/2006  ? HYPERTENSION, BENIGN SYSTEMIC 09/07/2006  ? ?PCP:  [name], FNP ?Pharmacy:   ?[facility] ?[address] ?[place] [address] ?Phone: [phone] Fax: [phone] ? ?[id number] - [place], [place] - [address] AT [facility] [address] ?[address] ?[place] [address] ?Phone: [phone] Fax: [phone] ? ? ? ? ?Social Determinants of Health (SDOH) Interventions ?  ? ?Readmission Risk Interventions ? ?  06/22/2020  ?  3:40 PM  ?Readmission Risk Prevention Plan  ?Transportation Screening Complete  ?Medication Review Oceanographer) Complete  ?PCP or Specialist appointment within 3-5 days of discharge Complete  ?HRI or Home Care Consult Complete  ?SW Recovery Care/Counseling Consult Complete  ?Palliative Care Screening Not Applicable  ?Skilled Nursing Facility Patient Refused  ? ? ? ?

## 2021-11-12 NOTE — Assessment & Plan Note (Addendum)
Decompensation due to renal failure and volume overload.  ? ?Echocardiogram from 2022 with preserved LV systolic function, moderate TR. Moderate elevation in pulmonary artery systolic pressure.  ? ?Improved volume status.  ?Tolerating well renal replacement therapy withy ultrafiltration.  ?Plan for hemodialysis today.  ?

## 2021-11-12 NOTE — Assessment & Plan Note (Signed)
Stable Hgb and hct  ?

## 2021-11-12 NOTE — Hospital Course (Addendum)
Mr. Raymond [name] was admitted to the hospital with the working diagnosis of volume overload in the setting of ESRD.  ? ?66 yo male with the past medical history of ESRD on HD, T/TH/Sat heart failure, COPD, paroxysmal atrial fibrillation who presented with dyspnea and chest pain. Apparently patient has missed 2 weeks of hemodialysis. Continue use crack cocaine. On the day of hospitalization he suffered acute chest pain that prompted him to go to urgent care. He received SL nitroglycerin and referred to the ED. Blood pressure 184/100. HR 79, RR 13, 02 saturation 97%, lungs with rales bilaterally with increased work of breathing, heart with S1 and S2 present and rhythmic, abdomen soft and no lower extremity edema. Positive confusion.  ? ?Na 143, K 7,4 CL 99, bicarbonate 20, glucose 90, bun 178 cr 28,3  ?High sensitive troponin 51  ?Wbc 3,7 hgb 8,1, plt 158  ? ?Chest radiograph with cardiomegaly and bilateral hilar vascular congestion, no infiltrates or effusions.  ? ?EKG 83 bpm, normal axis, normal intervals, sinus rhythm with right atrial enlargement, no significant ST segment or T wave changes. Positive LVH.  ? ?Patient underwent emergent hemodialysis with ultrafiltration 3,5 L with improvement in volume status but not back to baseline.  ? ?Plan for HD on 05/06  ? ?05/07 positive fever, sent for blood cultures.  ?05/08 afebrile, positive anemia requiring PRBC transfusion 1 units ?05/09 persistent anemia, plan for PRBC transfusion on HD   ? ?Pending placement at SNF ?

## 2021-11-12 NOTE — Progress Notes (Signed)
Case discussed with renal NP and hospital CSW. TOC staff (CSW) aware pt may need additional housing resources prior to d/c. Pt can use medicaid transportation to/from HD as long as pt will provide an address to transportation where pt needs/wants to be picked up/dropped off at. Will assist as needed.  ? ?Melven Sartorius ?Renal Navigator ?[phone] ?

## 2021-11-12 NOTE — Assessment & Plan Note (Signed)
Uremic encephalopathy. ?BUN has improved after HD. ?Encephalopathy has resolved.  ?

## 2021-11-12 NOTE — Progress Notes (Addendum)
?[facility] ?Progress Note  ? ?Subjective:    ?Seen and examined patient at bedside. Tolerated yesterday's HD with net UF 3.5L. Denies SOB, CP, and N/V. Plan for HD 11/13/21. ? ?Objective ?Vitals:  ? 11/12/21 0500 11/12/21 0602 11/12/21 2694 11/12/21 0910  ?BP:  (!) 166/77 (!) 156/102   ?Pulse:  90 87   ?Resp:  18 18   ?Temp:  99 ?F (37.2 ?C) 98.1 ?F (36.7 ?C)   ?TempSrc:  Oral Oral   ?SpO2:  94% 92% 93%  ?Weight: 68.6 kg     ? ?Physical Exam ?General: Awake and Alert; NAD ?Heart: S1 and S2; No murmurs, gallops, or rubs ?Lungs: Clear throughout; No wheezing, rales, or rhonchi ?Abdomen: Soft and non-tender ?Extremities: No edema BLLE ?Dialysis Access: LUE AVF (+) B/T  ? ?Filed Weights  ? 11/11/21 1720 11/11/21 1912 11/12/21 0500  ?Weight: 77.8 kg 68.6 kg 68.6 kg  ? ? ?Intake/Output Summary (Last 24 hours) at 11/12/2021 1120 ?Last data filed at 11/12/2021 8546 ?Gross per 24 hour  ?Intake 3 ml  ?Output 3508 ml  ?Net -3505 ml  ? ? ?Additional Objective ?Labs: ?Basic Metabolic Panel: ?Recent Labs  ?Lab 11/11/21 ?1034 11/11/21 ?2021 11/12/21 ?0439  ?NA 143  --  137  ?K 7.4*  --  4.9  ?CL 99  --  98  ?CO2 20*  --  26  ?GLUCOSE 90  --  70  ?BUN 178*  --  53*  ?CREATININE 28.39*  --  12.41*  ?CALCIUM 9.6  --  9.6  ?PHOS  --  5.4*  --   ? ?Liver Function Tests: ?Recent Labs  ?Lab 11/11/21 ?1034  ?AST <5*  ?ALT 7  ?ALKPHOS 47  ?BILITOT 0.4  ?PROT 7.0  ?ALBUMIN 2.8*  ? ?No results for input(s): LIPASE, AMYLASE in the last 168 hours. ?CBC: ?Recent Labs  ?Lab 11/11/21 ?1034  ?WBC 3.7*  ?NEUTROABS 2.3  ?HGB 8.1*  ?HCT 26.2*  ?MCV 99.6  ?PLT 158  ? ?Blood Culture ?   ?Component Value Date/Time  ? SDES BLOOD RIGHT HAND 04/13/2021 0600  ? SPECREQUEST  04/13/2021 0600  ?  BOTTLES DRAWN AEROBIC AND ANAEROBIC Blood Culture adequate volume  ? CULT  04/13/2021 0600  ?  NO GROWTH 5 DAYS ?Performed at [facility], [place] [address] [place], [place] [address] ?  ? REPTSTATUS 04/18/2021 FINAL 04/13/2021 0600  ? ? ?Cardiac  Enzymes: ?No results for input(s): CKTOTAL, CKMB, CKMBINDEX, TROPONINI in the last 168 hours. ?CBG: ?Recent Labs  ?Lab 11/11/21 ?1152 11/11/21 ?1231 11/11/21 ?1806 11/11/21 ?1900 11/11/21 ?1910  ?GLUCAP 64* 48* 45* 73 88  ? ?Iron Studies: No results for input(s): IRON, TIBC, TRANSFERRIN, FERRITIN in the last 72 hours. ?Lab Results  ?Component Value Date  ? INR 1.5 (H) 07/29/2020  ? INR 1.5 (H) 07/28/2020  ? INR 1.23 08/19/2018  ? ?Studies/Results: ?DG Chest Portable 1 View ? ?Result Date: 11/11/2021 ?CLINICAL DATA:  Chest pain. EXAM: PORTABLE CHEST 1 VIEW COMPARISON:  Chest XR, 10/14/2021.  CT chest, 08/10/2018. FINDINGS: Cardiomediastinal silhouette is enlarged. Aortic aortic arch calcifications. RIGHT basilar nodularity is suspected to represent a nipple shadow. Trace streaky bibasilar opacities without focal consolidation. No pleural effusion or pneumothorax. No acute displaced fracture. IMPRESSION: 1. Cardiomegaly and minimal basilar atelectasis. 2.  Aortic Atherosclerosis (ICD10-I70.0). Electronically Signed   By: Michaelle Birks M.D.   On: 11/11/2021 10:15   ? ?Medications: ? sodium chloride    ? ? amiodarone  200 mg  Oral Daily  ? amLODipine  5 mg Oral QHS  ? aspirin EC  81 mg Oral Daily  ? [START ON 11/13/2021] calcitRIOL  0.5 mcg Oral Q T,Th,Sa-HD  ? [START ON 11/13/2021] calcitRIOL  2 mcg Oral Q T,Th,Sa-HD  ? carvedilol  12.5 mg Oral BID  ? [START ON 11/13/2021] cinacalcet  180 mg Oral Q T,Th,Sa-HD  ? [START ON 11/18/2021] darbepoetin (ARANESP) injection - DIALYSIS  200 mcg Intravenous Q Thu-HD  ? fluticasone furoate-vilanterol  1 puff Inhalation Daily  ? folic acid  1 mg Oral Daily  ? heparin  5,000 Units Subcutaneous Q12H  ? pantoprazole  40 mg Oral BID AC  ? rosuvastatin  10 mg Oral Daily  ? sevelamer carbonate  4,000 mg Oral TID WC  ? sodium chloride flush  3 mL Intravenous Q12H  ? thiamine  100 mg Oral Daily  ? umeclidinium bromide  1 puff Inhalation Daily  ? ? ?Dialysis Orders: ?[facility] TTS ?4h  400/1.5  68kg    2/2  bath  LFA AVF  Hep none  Hep none ? - last HD 4/22, post HD 68kg ? - last Hb 8.6 on 4/20 ? - mircera 200 ug q2, last 4/13, was due 4/26 ? - venofer 50 q wk ? - rocaltrol 0.5 ug tiw po ?  ?Assessment/Plan: ?Uremia - w/ azotemia, creat 28. Missed HD x 2 wks. Pt is homeless.  ?Homelessness - major issue w/ HD pt, he may need assistance with placement, will d/w renal navigator and SW ?Cocaine - hx of abuse  ?ESRD - on HD TTS. Tolerated yesterday's HD with net UF  3.5L. Pt getting closer to his EDW. Plan for HD 11/13/21.  ?Hyperkalemia - K+ 7.2 at admit, now resolving with HD. K+ now 4.9 ?HTN /vol - BP's high, CXR neg, Cont home meds, lower vol w/ HD, LE improving with HD ?Atrial Fib - on coreg, eliquis ?MBD ckd - CCa in range, PO4 ok. Cont binder and po vdra.  ?Anemia ckd - Hb 8.1, esa due on 4/26. Will order darbe 200 ug weekly on Thursday while here.  ? ?Tobie Poet, NP ?[facility] ?11/12/2021,11:20 AM ? LOS: 1 day  ?  ?

## 2021-11-12 NOTE — Assessment & Plan Note (Signed)
No clinical signs of exacerbation  

## 2021-11-12 NOTE — Plan of Care (Signed)
  Problem: Nutrition: Goal: Adequate nutrition will be maintained Outcome: Progressing   Problem: Pain Managment: Goal: General experience of comfort will improve Outcome: Progressing   

## 2021-11-13 DIAGNOSIS — I5033 Acute on chronic diastolic (congestive) heart failure: Secondary | ICD-10-CM | POA: Diagnosis not present

## 2021-11-13 DIAGNOSIS — I480 Paroxysmal atrial fibrillation: Secondary | ICD-10-CM

## 2021-11-13 DIAGNOSIS — N186 End stage renal disease: Secondary | ICD-10-CM | POA: Diagnosis not present

## 2021-11-13 DIAGNOSIS — I10 Essential (primary) hypertension: Secondary | ICD-10-CM

## 2021-11-13 DIAGNOSIS — G9341 Metabolic encephalopathy: Secondary | ICD-10-CM | POA: Diagnosis not present

## 2021-11-13 DIAGNOSIS — J449 Chronic obstructive pulmonary disease, unspecified: Secondary | ICD-10-CM | POA: Diagnosis not present

## 2021-11-13 LAB — BASIC METABOLIC PANEL
Anion gap: 14 (ref 5–15)
BUN: 73 mg/dL — ABNORMAL HIGH (ref 8–23)
CO2: 29 mmol/L (ref 22–32)
Calcium: 9.1 mg/dL (ref 8.9–10.3)
Chloride: 96 mmol/L — ABNORMAL LOW (ref 98–111)
Creatinine, Ser: 14.41 mg/dL — ABNORMAL HIGH (ref 0.61–1.24)
GFR, Estimated: 3 mL/min — ABNORMAL LOW (ref >60)
Glucose, Bld: 108 mg/dL — ABNORMAL HIGH (ref 70–99)
Potassium: 4.4 mmol/L (ref 3.5–5.1)
Sodium: 139 mmol/L (ref 135–145)

## 2021-11-13 MED ORDER — PROCHLORPERAZINE EDISYLATE 10 MG/2ML IJ SOLN
10.0000 mg | Freq: Once | INTRAMUSCULAR | Status: AC
  Administered 2021-11-13: 10 mg via INTRAVENOUS
  Filled 2021-11-13: qty 2

## 2021-11-13 MED ORDER — MELATONIN 5 MG PO TABS
10.0000 mg | ORAL_TABLET | Freq: Every evening | ORAL | Status: DC | PRN
  Administered 2021-11-13 – 2021-11-30 (×13): 10 mg via ORAL
  Filled 2021-11-13 (×13): qty 2

## 2021-11-13 NOTE — Assessment & Plan Note (Addendum)
Continue with amiodarone ?Patient not on anticoagulation due to history of severe GI bleed.   ?Patient with drop in hgb but no clinical signs of recurrent GI bleeding, will continue close monitoring of H&H.  ?

## 2021-11-13 NOTE — Assessment & Plan Note (Signed)
Continue blood pressure control with amlodipine.  ? ?Dyslipidemia, continue with rosuvastatin,.  ?

## 2021-11-13 NOTE — Progress Notes (Signed)
HOSPITAL MEDICINE OVERNIGHT EVENT NOTE   ? ?Notified by nursing that patient has been complaining of nausea throughout the day.  Patient received Zofran earlier in the day with no improvement in symptoms.  Nursing requesting an alternative antiemetic. ? ?Patient has no symptoms of associated abdominal pain.  It is possible that the patient's uremia is causing the nausea. ? ?Ordering a one-time dose of Compazine.  Due to patient receiving multiple QT prolonging agents will reorder telemetry. ? ?Vernelle Emerald  MD ?[facility]  ? ? ? ? ? ? ? ? ? ? ?

## 2021-11-13 NOTE — Progress Notes (Addendum)
?Progress Note ? ? ?PatientRENAUD [name] ELF:[id number] DOB: 11-18-1955 DOA: 11/11/2021     2 ?DOS: the patient was seen and examined on 11/13/2021 ?  ?Brief hospital course: ?Mr. Raymond [name] was admitted to the hospital with the working diagnosis of volume overload in the setting of ESRD.  ? ?66 yo male with the past medical history of ESRD on HD, T/TH/Sat heart failure, COPD, paroxysmal atrial fibrillation who presented with dyspnea and chest pain. Apparently patient has missed 2 weeks of hemodialysis. Continue use crack cocaine. On the day of hospitalization he suffered acute chest pain that prompted him to go to urgent care. He received SL nitroglycerin and referred to the ED. Blood pressure 184/100. HR 79, RR 13, 02 saturation 97%, lungs with rales bilaterally with increased work of breathing, heart with S1 and S2 present and rhythmic, abdomen soft and no lower extremity edema. Positive confusion.  ? ?Na 143, K 7,4 CL 99, bicarbonate 20, glucose 90, bun 178 cr 28,3  ?High sensitive troponin 51  ?Wbc 3,7 hgb 8,1, plt 158  ? ?Chest radiograph with cardiomegaly and bilateral hilar vascular congestion, no infiltrates or effusions.  ? ?EKG 83 bpm, normal axis, normal intervals, sinus rhythm with right atrial enlargement, no significant ST segment or T wave changes. Positive LVH.  ? ?Patient underwent emergent hemodialysis with ultrafiltration 3,5 L with improvement in volume status but not back to baseline.  ? ?Plan for HD on 05/06  ? ?Assessment and Plan: ?* ESRD (end stage renal disease) on dialysis [facility]) ?ESRD on HD, hyperkalemia, volume overload and acute pulmonary edema.  ? ?His volume status has improved with renal replacement therapy and ultrafiltration.  ? ? serum K is 4,4 bicarbonate is 29.  ?Continue HD per nephrology recommendations. ?Patient with homeless and will need place to go before discharge.  ? ?Anemia of chronic renal disease, continue with epo ?Metabolic bone disease continue with cacitriol and sensipar.   ? ?Acute on chronic diastolic CHF (congestive heart failure) ([place]) ?Decompensation due to renal failure and volume overload.  ? ?Echocardiogram from 2022 with preserved LV systolic function, moderate TR. Moderate elevation in pulmonary artery systolic pressure.  ? ?Improved volume status.  ? ?Acute metabolic encephalopathy ?Uremic encephalopathy. ?BUN has improved after HD. ?Encephalopathy has resolved.  ? ?COPD (chronic obstructive pulmonary disease) ([place]) ?No clinical signs of exacerbation ? ? ?Paroxysmal atrial fibrillation (HCC) ?Continue with amiodarone ?Patient not on anticoagulation due to history of severe GI bleed.   ? ?Hypertension ?Continue blood pressure control with amlodipine.  ? ?Dyslipidemia, continue with rosuvastatin,.  ? ?Substance abuse ([place]) ?Patient with no clinical signs of withdrawal. ?Currently is homeless, will need to follow up with social services before discharge.  ? ? ? ? ?  ? ?Subjective: Patient with no chest pain or dyspnea  ? ?Physical Exam: ?Vitals:  ? 11/13/21 1030 11/13/21 1100 11/13/21 1116 11/13/21 1125  ?BP: 112/61 122/70 117/69 131/74  ?Pulse: 75 74 70 72  ?Resp: 20 20 (!) 23 19  ?Temp:    98.7 ?F (37.1 ?C)  ?TempSrc:    Oral  ?SpO2: 100% 100% 100% 100%  ?Weight:    65.8 kg  ? ?Neurology awake and alert ?ENT with no pallor ?Cardiovascular with S1 and S2 present and rhythmic with no gallops, rubs or murmurs ?Respiratory with no rales or wheezing ?Abdomen not distended ?No lower extremity edema  ?Data Reviewed: ? ? ? ?Family Communication: no family at the bedside  ? ?Disposition: ?Status is: Inpatient ?Remains inpatient  appropriate because: pending placement  ? Planned Discharge Destination: Skilled nursing facility ? ? ? ? ? ?Author: ?Tawni Millers, MD ?11/13/2021 2:29 PM ? ?For on call review [email or web address].  ?

## 2021-11-13 NOTE — Progress Notes (Signed)
?[facility] ?Progress Note  ? ?Subjective:    ?Seen and examined patient on HD. Denies SOB, CP, and N/V. Tolerating UFG 2.5L. ? ?Objective ?Vitals:  ? 11/13/21 0093 11/13/21 8182 11/13/21 9937 11/13/21 0842  ?BP: (!) 145/79 (!) 149/71 132/67 (!) 150/86  ?Pulse: 73 74 76 70  ?Resp: 13 15 (!) 23 14  ?Temp: 99.1 ?F (37.3 ?C)     ?TempSrc: Oral     ?SpO2: 96% 97% 100% 99%  ?Weight: 67.8 kg     ? ?Physical Exam ?General: Awake and Alert; NAD ?Heart: S1 and S2; No murmurs, gallops, or rubs ?Lungs: Clear throughout; No wheezing, rales, or rhonchi ?Abdomen: Soft and non-tender ?Extremities: No edema BLLE ?Dialysis Access: LUE AVF (+) B/T  ? ?Filed Weights  ? 11/12/21 0500 11/13/21 0354 11/13/21 0735  ?Weight: 68.6 kg 70 kg 67.8 kg  ? ? ?Intake/Output Summary (Last 24 hours) at 11/13/2021 0859 ?Last data filed at 11/12/2021 1900 ?Gross per 24 hour  ?Intake 517 ml  ?Output --  ?Net 517 ml  ? ? ?Additional Objective ?Labs: ?Basic Metabolic Panel: ?Recent Labs  ?Lab 11/11/21 ?1034 11/11/21 ?2021 11/12/21 ?1696 11/13/21 ?0432  ?NA 143  --  137 139  ?K 7.4*  --  4.9 4.4  ?CL 99  --  98 96*  ?CO2 20*  --  26 29  ?GLUCOSE 90  --  70 108*  ?BUN 178*  --  53* 73*  ?CREATININE 28.39*  --  12.41* 14.41*  ?CALCIUM 9.6  --  9.6 9.1  ?PHOS  --  5.4*  --   --   ? ?Liver Function Tests: ?Recent Labs  ?Lab 11/11/21 ?1034  ?AST <5*  ?ALT 7  ?ALKPHOS 47  ?BILITOT 0.4  ?PROT 7.0  ?ALBUMIN 2.8*  ? ?No results for input(s): LIPASE, AMYLASE in the last 168 hours. ?CBC: ?Recent Labs  ?Lab 11/11/21 ?1034  ?WBC 3.7*  ?NEUTROABS 2.3  ?HGB 8.1*  ?HCT 26.2*  ?MCV 99.6  ?PLT 158  ? ?Blood Culture ?   ?Component Value Date/Time  ? SDES BLOOD RIGHT HAND 04/13/2021 0600  ? SPECREQUEST  04/13/2021 0600  ?  BOTTLES DRAWN AEROBIC AND ANAEROBIC Blood Culture adequate volume  ? CULT  04/13/2021 0600  ?  NO GROWTH 5 DAYS ?Performed at [facility], [place] [address] [place], [place] [address] ?  ? REPTSTATUS 04/18/2021 FINAL 04/13/2021 0600   ? ? ?Cardiac Enzymes: ?No results for input(s): CKTOTAL, CKMB, CKMBINDEX, TROPONINI in the last 168 hours. ?CBG: ?Recent Labs  ?Lab 11/11/21 ?1152 11/11/21 ?1231 11/11/21 ?1806 11/11/21 ?1900 11/11/21 ?1910  ?GLUCAP 64* 48* 45* 73 88  ? ?Iron Studies: No results for input(s): IRON, TIBC, TRANSFERRIN, FERRITIN in the last 72 hours. ?Lab Results  ?Component Value Date  ? INR 1.5 (H) 07/29/2020  ? INR 1.5 (H) 07/28/2020  ? INR 1.23 08/19/2018  ? ?Studies/Results: ?DG Chest Portable 1 View ? ?Result Date: 11/11/2021 ?CLINICAL DATA:  Chest pain. EXAM: PORTABLE CHEST 1 VIEW COMPARISON:  Chest XR, 10/14/2021.  CT chest, 08/10/2018. FINDINGS: Cardiomediastinal silhouette is enlarged. Aortic aortic arch calcifications. RIGHT basilar nodularity is suspected to represent a nipple shadow. Trace streaky bibasilar opacities without focal consolidation. No pleural effusion or pneumothorax. No acute displaced fracture. IMPRESSION: 1. Cardiomegaly and minimal basilar atelectasis. 2.  Aortic Atherosclerosis (ICD10-I70.0). Electronically Signed   By: Michaelle Birks M.D.   On: 11/11/2021 10:15   ? ?Medications: ? sodium chloride    ? sodium chloride    ?  sodium chloride    ? ? amiodarone  200 mg Oral Daily  ? amLODipine  5 mg Oral QHS  ? aspirin EC  81 mg Oral Daily  ? calcitRIOL  0.5 mcg Oral Q T,Th,Sa-HD  ? calcitRIOL  2 mcg Oral Q T,Th,Sa-HD  ? carvedilol  12.5 mg Oral BID  ? cinacalcet  180 mg Oral Q T,Th,Sa-HD  ? [START ON 11/18/2021] darbepoetin (ARANESP) injection - DIALYSIS  200 mcg Intravenous Q Thu-HD  ? fluticasone furoate-vilanterol  1 puff Inhalation Daily  ? folic acid  1 mg Oral Daily  ? heparin  5,000 Units Subcutaneous Q12H  ? pantoprazole  40 mg Oral BID AC  ? rosuvastatin  10 mg Oral Daily  ? sevelamer carbonate  4,000 mg Oral TID WC  ? sodium chloride flush  3 mL Intravenous Q12H  ? thiamine  100 mg Oral Daily  ? umeclidinium bromide  1 puff Inhalation Daily  ? ? ?Dialysis Orders: ?[facility] TTS ?4h  400/1.5  68kg    2/2 bath   LFA AVF  Hep none  Hep none ? - last HD 4/22, post HD 68kg ? - last Hb 8.6 on 4/20 ? - mircera 200 ug q2, last 4/13, was due 4/26 ? - venofer 50 q wk ? - rocaltrol 0.5 ug tiw po ? ?Assessment/Plan: ?Uremia - w/ azotemia, creat 28. Missed HD x 2 wks. Pt is homeless.  ?Homelessness - major issue w/ HD pt, he may need assistance with placement, discussed with SW and Renal Navigator. Housing resources previously provided to patient in outpatient HD center. SW already aware he may need additional resources prior to dc. ?Cocaine - hx of abuse  ?ESRD - on HD TTS. Tolerated yesterday's HD with net UF  3.5L. Pt getting closer to his EDW. On HD.  ?Hyperkalemia - K+ 7.2 at admit, now resolving with K+ now 4.4 ?HTN /vol - BP's improving, CXR neg, Cont home meds, lower vol w/ HD, LE improving with HD ?Atrial Fib - on coreg, eliquis ?MBD ckd - CCa in range, PO4 ok. Cont binder and po vdra.  ?Anemia ckd - Hb 8.1, esa due on 4/26. ESA ordered weekly on Thursdays while here.  ? ?Tobie Poet, NP ?[facility] ?11/13/2021,8:59 AM ? LOS: 2 days  ?  ?

## 2021-11-13 NOTE — Progress Notes (Signed)
Pt c/o nausea and states Zofran did not help earlier. It is not time for more. RN messaged MD on call to see if an order for another medication can be placed. Awaiting response.  ? ?Raymond [name] Raymond [name] ? ?

## 2021-11-13 NOTE — Progress Notes (Signed)
Came to administer inhalers, pt is out of the room currently.  ?

## 2021-11-14 DIAGNOSIS — N186 End stage renal disease: Secondary | ICD-10-CM | POA: Diagnosis not present

## 2021-11-14 DIAGNOSIS — G9341 Metabolic encephalopathy: Secondary | ICD-10-CM | POA: Diagnosis not present

## 2021-11-14 DIAGNOSIS — J449 Chronic obstructive pulmonary disease, unspecified: Secondary | ICD-10-CM | POA: Diagnosis not present

## 2021-11-14 DIAGNOSIS — I480 Paroxysmal atrial fibrillation: Secondary | ICD-10-CM

## 2021-11-14 DIAGNOSIS — I5033 Acute on chronic diastolic (congestive) heart failure: Secondary | ICD-10-CM | POA: Diagnosis not present

## 2021-11-14 NOTE — Progress Notes (Signed)
?   11/14/21 0758  ?Assess: MEWS Score  ?Temp (!) 102 ?F (38.9 ?C)  ?BP 128/79  ?Pulse Rate 93  ?SpO2 93 %  ?O2 Device Room Air  ?Assess: MEWS Score  ?MEWS Temp 2  ?MEWS Systolic 0  ?MEWS Pulse 0  ?MEWS RR 0  ?MEWS LOC 0  ?MEWS Score 2  ?MEWS Score Color Yellow  ?Assess: if the MEWS score is Yellow or Red  ?Were vital signs taken at a resting state? Yes  ?Focused Assessment No change from prior assessment  ?Early Detection of Sepsis Score *See Row Information* High  ?MEWS guidelines implemented *See Row Information* Yes  ?Treat  ?MEWS Interventions Administered prn meds/treatments;Administered scheduled meds/treatments  ?Pain Scale 0-10  ?Pain Score 0  ?Pain Intervention(s) Medication (See eMAR);Repositioned;Emotional support  ?Take Vital Signs  ?Increase Vital Sign Frequency  Yellow: Q 2hr X 2 then Q 4hr X 2, if remains yellow, continue Q 4hrs  ?Escalate  ?MEWS: Escalate Yellow: discuss with charge nurse/RN and consider discussing with provider and RRT  ?Notify: Charge Nurse/RN  ?Name of Charge Nurse/RN Notified Candace  ?Date Charge Nurse/RN Notified 11/14/21  ?Time Charge Nurse/RN Notified [phone]  ?Notify: Provider  ?Provider Name/Title Arrien  ?Date Provider Notified 11/14/21  ?Time Provider Notified [phone]  ?Notification Type Page  ?Notification Reason Other (Comment)  ?Provider response No new orders  ?Date of Provider Response 11/14/21  ?Time of Provider Response 0830  ?Document  ?Patient Outcome Stabilized after interventions  ? ? ?

## 2021-11-14 NOTE — Progress Notes (Signed)
?Progress Note ? ? ?PatientJAKAREE [name] YKZ:[id number] DOB: 1955-08-31 DOA: 11/11/2021     3 ?DOS: the patient was seen and examined on 11/14/2021 ?  ?Brief hospital course: ?Mr. Crochet was admitted to the hospital with the working diagnosis of volume overload in the setting of ESRD.  ? ?66 yo male with the past medical history of ESRD on HD, T/TH/Sat heart failure, COPD, paroxysmal atrial fibrillation who presented with dyspnea and chest pain. Apparently patient has missed 2 weeks of hemodialysis. Continue use crack cocaine. On the day of hospitalization he suffered acute chest pain that prompted him to go to urgent care. He received SL nitroglycerin and referred to the ED. Blood pressure 184/100. HR 79, RR 13, 02 saturation 97%, lungs with rales bilaterally with increased work of breathing, heart with S1 and S2 present and rhythmic, abdomen soft and no lower extremity edema. Positive confusion.  ? ?Na 143, K 7,4 CL 99, bicarbonate 20, glucose 90, bun 178 cr 28,3  ?High sensitive troponin 51  ?Wbc 3,7 hgb 8,1, plt 158  ? ?Chest radiograph with cardiomegaly and bilateral hilar vascular congestion, no infiltrates or effusions.  ? ?EKG 83 bpm, normal axis, normal intervals, sinus rhythm with right atrial enlargement, no significant ST segment or T wave changes. Positive LVH.  ? ?Patient underwent emergent hemodialysis with ultrafiltration 3,5 L with improvement in volume status but not back to baseline.  ? ?Plan for HD on 05/06  ? ?Assessment and Plan: ?* ESRD (end stage renal disease) on dialysis [facility]) ?ESRD on HD, hyperkalemia, volume overload and acute pulmonary edema.  ? ?K today is 4,4 with bicarbonate at 29.  ? ?Continue HD per nephrology recommendations. ?Patient with homeless and will need place to go before discharge.  ? ?Anemia of chronic renal disease, continue with epo ?Metabolic bone disease continue with cacitriol and sensipar.  ? ?Patient had a fever spike today at 100.4 F (10:21 am), no clinical signs of  infection, will continue monitoring.  ? ?Acute on chronic diastolic CHF (congestive heart failure) ([place]) ?Decompensation due to renal failure and volume overload.  ? ?Echocardiogram from 2022 with preserved LV systolic function, moderate TR. Moderate elevation in pulmonary artery systolic pressure.  ? ?Improved volume status.  ? ?Acute metabolic encephalopathy ?Uremic encephalopathy. ?BUN has improved after HD. ?Encephalopathy has resolved.  ? ?COPD (chronic obstructive pulmonary disease) ([place]) ?No clinical signs of exacerbation ? ? ?Paroxysmal atrial fibrillation (HCC) ?Continue with amiodarone ?Patient not on anticoagulation due to history of severe GI bleed.   ? ?Hypertension ?Continue blood pressure control with amlodipine.  ? ?Dyslipidemia, continue with rosuvastatin,.  ? ?Substance abuse ([place]) ?Patient with no clinical signs of withdrawal. ?Currently is homeless, will need to follow up with social services before discharge.  ? ? ? ? ?  ? ?Subjective: Patient with no chest pain or dyspnea , no nausea or vomiting  ? ?Physical Exam: ?Vitals:  ? 11/14/21 0758 11/14/21 0829 11/14/21 1021 11/14/21 1244  ?BP: 128/79  99/61 109/70  ?Pulse: 93 84 78 77  ?Resp:  18    ?Temp: (!) 102 ?F (38.9 ?C)  (!) 100.4 ?F (38 ?C) 99.8 ?F (37.7 ?C)  ?TempSrc: Oral  Oral Oral  ?SpO2: 93% 95% 93% 96%  ?Weight:      ? ?Neurology awake and alert  ?ENT with no pallor ?Cardiovascular with S1 and S2 present with no gallops, rubs or murmurs ?Respiratory with no rales or wheezing ?Abdomen not distended ?No lower extremity edema  ?Data  Reviewed: ? ? ? ?Family Communication: no family at the bedside  ? ?Disposition: ?Status is: Inpatient ?Remains inpatient appropriate because: pending placement  ? Planned Discharge Destination: Skilled nursing facility ? ? ? ? ? ?Author: ?Tawni Millers, MD ?11/14/2021 3:35 PM ? ?For on call review [email or web address]  ?

## 2021-11-14 NOTE — Progress Notes (Signed)
?[facility] ?Progress Note  ? ?Subjective:    ?Seen and examined patient at bedside. Tolerated yesterday's HD with net UF 2L. Noted patient c/o nausea overnight. Given 1x dose Compazine. He reports medication helped alittle. Currently denies SOB and CP. Patient reports he may go home tomorrow. ? ?Objective ?Vitals:  ? 11/13/21 2122 11/14/21 0459 11/14/21 0758 11/14/21 0829  ?BP: (!) 150/80 (!) 154/78 128/79   ?Pulse: 70 94 93 84  ?Resp: '18 18  18  '$ ?Temp: 98.9 ?F (37.2 ?C) 97.9 ?F (36.6 ?C) (!) 102 ?F (38.9 ?C)   ?TempSrc:   Oral   ?SpO2: 99% 94% 93% 95%  ?Weight:      ? ?Physical Exam ?General: Awake and Alert; NAD ?Heart: S1 and S2; No murmurs, gallops, or rubs ?Lungs: Clear throughout; No wheezing, rales, or rhonchi ?Abdomen: Soft and non-tender ?Extremities: No edema BLLE ?Dialysis Access: LUE AVF (+) B/T  ? ?Filed Weights  ? 11/13/21 0354 11/13/21 0735 11/13/21 1125  ?Weight: 70 kg 67.8 kg 65.8 kg  ? ? ?Intake/Output Summary (Last 24 hours) at 11/14/2021 0939 ?Last data filed at 11/14/2021 0601 ?Gross per 24 hour  ?Intake 460 ml  ?Output 2000 ml  ?Net -1540 ml  ? ? ?Additional Objective ?Labs: ?Basic Metabolic Panel: ?Recent Labs  ?Lab 11/11/21 ?1034 11/11/21 ?2021 11/12/21 ?5993 11/13/21 ?0432  ?NA 143  --  137 139  ?K 7.4*  --  4.9 4.4  ?CL 99  --  98 96*  ?CO2 20*  --  26 29  ?GLUCOSE 90  --  70 108*  ?BUN 178*  --  53* 73*  ?CREATININE 28.39*  --  12.41* 14.41*  ?CALCIUM 9.6  --  9.6 9.1  ?PHOS  --  5.4*  --   --   ? ?Liver Function Tests: ?Recent Labs  ?Lab 11/11/21 ?1034  ?AST <5*  ?ALT 7  ?ALKPHOS 47  ?BILITOT 0.4  ?PROT 7.0  ?ALBUMIN 2.8*  ? ?No results for input(s): LIPASE, AMYLASE in the last 168 hours. ?CBC: ?Recent Labs  ?Lab 11/11/21 ?1034  ?WBC 3.7*  ?NEUTROABS 2.3  ?HGB 8.1*  ?HCT 26.2*  ?MCV 99.6  ?PLT 158  ? ?Blood Culture ?   ?Component Value Date/Time  ? SDES BLOOD RIGHT HAND 04/13/2021 0600  ? SPECREQUEST  04/13/2021 0600  ?  BOTTLES DRAWN AEROBIC AND ANAEROBIC Blood Culture  adequate volume  ? CULT  04/13/2021 0600  ?  NO GROWTH 5 DAYS ?Performed at [facility], [place] [address] [place], [place] [address] ?  ? REPTSTATUS 04/18/2021 FINAL 04/13/2021 0600  ? ? ?Cardiac Enzymes: ?No results for input(s): CKTOTAL, CKMB, CKMBINDEX, TROPONINI in the last 168 hours. ?CBG: ?Recent Labs  ?Lab 11/11/21 ?1152 11/11/21 ?1231 11/11/21 ?1806 11/11/21 ?1900 11/11/21 ?1910  ?GLUCAP 64* 48* 45* 73 88  ? ?Iron Studies: No results for input(s): IRON, TIBC, TRANSFERRIN, FERRITIN in the last 72 hours. ?Lab Results  ?Component Value Date  ? INR 1.5 (H) 07/29/2020  ? INR 1.5 (H) 07/28/2020  ? INR 1.23 08/19/2018  ? ?Studies/Results: ?No results found. ? ?Medications: ? sodium chloride    ? sodium chloride    ? sodium chloride    ? ? amiodarone  200 mg Oral Daily  ? amLODipine  5 mg Oral QHS  ? aspirin EC  81 mg Oral Daily  ? calcitRIOL  0.5 mcg Oral Q T,Th,Sa-HD  ? calcitRIOL  2 mcg Oral Q T,Th,Sa-HD  ? carvedilol  12.5 mg  Oral BID  ? cinacalcet  180 mg Oral Q T,Th,Sa-HD  ? [START ON 11/18/2021] darbepoetin (ARANESP) injection - DIALYSIS  200 mcg Intravenous Q Thu-HD  ? fluticasone furoate-vilanterol  1 puff Inhalation Daily  ? folic acid  1 mg Oral Daily  ? heparin  5,000 Units Subcutaneous Q12H  ? pantoprazole  40 mg Oral BID AC  ? rosuvastatin  10 mg Oral Daily  ? sevelamer carbonate  4,000 mg Oral TID WC  ? sodium chloride flush  3 mL Intravenous Q12H  ? thiamine  100 mg Oral Daily  ? umeclidinium bromide  1 puff Inhalation Daily  ? ? ?Dialysis Orders: ?[facility] TTS ?4h  400/1.5  68kg    2/2 bath  LFA AVF  Hep none  Hep none ? - last HD 4/22, post HD 68kg ? - last Hb 8.6 on 4/20 ? - mircera 200 ug q2, last 4/13, was due 4/26 ? - venofer 50 q wk ? - rocaltrol 0.5 ug tiw po ? ?Assessment/Plan: ?Uremia - w/ azotemia, creat 28. Missed HD x 2 wks. Pt is homeless.  ?Homelessness - major issue w/ HD pt, he may need assistance with placement, discussed with SW and Renal Navigator. Housing resources previously  provided to patient in outpatient HD center. SW already aware he may need additional resources prior to dc. ?Cocaine - hx of abuse  ?ESRD - on HD TTS. Tolerated yesterday's HD with net UF  3.5L. Pt getting closer to his EDW. Plan for HD 11/16/21 if patient is still here.  ?Hyperkalemia - K+ 7.2 at admit, now resolving with K+ now 4.4 ?HTN /vol - BP's improving, CXR neg, Cont home meds, lower vol w/ HD, LE improving with HD ?Atrial Fib - on coreg, eliquis ?MBD ckd - CCa in range, PO4 ok. Cont binder and po vdra.  ?Anemia ckd - Hb 8.1, esa due on 4/26. ESA ordered weekly on Thursdays while here.  ?Dispo-needs SW needs to follow-up before discharge to ensure patient has adequate housing. Patient may require placement. ? ?Tobie Poet, NP ?[facility] ?11/14/2021,9:39 AM ? LOS: 3 days  ?  ?

## 2021-11-15 ENCOUNTER — Other Ambulatory Visit (HOSPITAL_COMMUNITY): Payer: Self-pay

## 2021-11-15 DIAGNOSIS — J449 Chronic obstructive pulmonary disease, unspecified: Secondary | ICD-10-CM | POA: Diagnosis not present

## 2021-11-15 DIAGNOSIS — I5033 Acute on chronic diastolic (congestive) heart failure: Secondary | ICD-10-CM | POA: Diagnosis not present

## 2021-11-15 DIAGNOSIS — G9341 Metabolic encephalopathy: Secondary | ICD-10-CM | POA: Diagnosis not present

## 2021-11-15 DIAGNOSIS — N186 End stage renal disease: Secondary | ICD-10-CM | POA: Diagnosis not present

## 2021-11-15 LAB — CBC WITH DIFFERENTIAL/PLATELET
Abs Immature Granulocytes: 0.02 10*3/uL (ref 0.00–0.07)
Basophils Absolute: 0 10*3/uL (ref 0.0–0.1)
Basophils Relative: 0 %
Eosinophils Absolute: 0 10*3/uL (ref 0.0–0.5)
Eosinophils Relative: 0 %
HCT: 21.2 % — ABNORMAL LOW (ref 39.0–52.0)
Hemoglobin: 6.7 g/dL — CL (ref 13.0–17.0)
Immature Granulocytes: 1 %
Lymphocytes Relative: 16 %
Lymphs Abs: 0.4 10*3/uL — ABNORMAL LOW (ref 0.7–4.0)
MCH: 31.2 pg (ref 26.0–34.0)
MCHC: 31.6 g/dL (ref 30.0–36.0)
MCV: 98.6 fL (ref 80.0–100.0)
Monocytes Absolute: 0.7 10*3/uL (ref 0.1–1.0)
Monocytes Relative: 27 %
Neutro Abs: 1.4 10*3/uL — ABNORMAL LOW (ref 1.7–7.7)
Neutrophils Relative %: 56 %
Platelets: 138 10*3/uL — ABNORMAL LOW (ref 150–400)
RBC: 2.15 MIL/uL — ABNORMAL LOW (ref 4.22–5.81)
RDW: 18.3 % — ABNORMAL HIGH (ref 11.5–15.5)
WBC: 2.5 10*3/uL — ABNORMAL LOW (ref 4.0–10.5)
nRBC: 0 % (ref 0.0–0.2)

## 2021-11-15 LAB — RENAL FUNCTION PANEL
Albumin: 2.3 g/dL — ABNORMAL LOW (ref 3.5–5.0)
Anion gap: 9 (ref 5–15)
BUN: 42 mg/dL — ABNORMAL HIGH (ref 8–23)
CO2: 25 mmol/L (ref 22–32)
Calcium: 8.1 mg/dL — ABNORMAL LOW (ref 8.9–10.3)
Chloride: 100 mmol/L (ref 98–111)
Creatinine, Ser: 10.72 mg/dL — ABNORMAL HIGH (ref 0.61–1.24)
GFR, Estimated: 5 mL/min — ABNORMAL LOW (ref >60)
Glucose, Bld: 101 mg/dL — ABNORMAL HIGH (ref 70–99)
Phosphorus: 4.2 mg/dL (ref 2.5–4.6)
Potassium: 4.5 mmol/L (ref 3.5–5.1)
Sodium: 134 mmol/L — ABNORMAL LOW (ref 135–145)

## 2021-11-15 LAB — PREPARE RBC (CROSSMATCH)

## 2021-11-15 MED ORDER — SODIUM CHLORIDE 0.9% IV SOLUTION: Freq: Once | INTRAVENOUS | Status: DC

## 2021-11-15 MED ORDER — CHLORHEXIDINE GLUCONATE CLOTH 2 % EX PADS
6.0000 | MEDICATED_PAD | Freq: Every day | CUTANEOUS | Status: DC
  Administered 2021-11-15 – 2021-11-19 (×3): 6 via TOPICAL

## 2021-11-15 NOTE — Progress Notes (Signed)
?Progress Note ? ? ?PatientDAKODA [name] NOB:[id number] DOB: [birth year] DOA: 11/11/2021     4 ?DOS: the patient was seen and examined on 11/15/2021 ?  ?Brief hospital course: ?Mr. Sulkowski was admitted to the hospital with the working diagnosis of volume overload in the setting of ESRD.  ? ?66 yo male with the past medical history of ESRD on HD, T/TH/Sat heart failure, COPD, paroxysmal atrial fibrillation who presented with dyspnea and chest pain. Apparently patient has missed 2 weeks of hemodialysis. Continue use crack cocaine. On the day of hospitalization he suffered acute chest pain that prompted him to go to urgent care. He received SL nitroglycerin and referred to the ED. Blood pressure 184/100. HR 79, RR 13, 02 saturation 97%, lungs with rales bilaterally with increased work of breathing, heart with S1 and S2 present and rhythmic, abdomen soft and no lower extremity edema. Positive confusion.  ? ?Na 143, K 7,4 CL 99, bicarbonate 20, glucose 90, bun 178 cr 28,3  ?High sensitive troponin 51  ?Wbc 3,7 hgb 8,1, plt 158  ? ?Chest radiograph with cardiomegaly and bilateral hilar vascular congestion, no infiltrates or effusions.  ? ?EKG 83 bpm, normal axis, normal intervals, sinus rhythm with right atrial enlargement, no significant ST segment or T wave changes. Positive LVH.  ? ?Patient underwent emergent hemodialysis with ultrafiltration 3,5 L with improvement in volume status but not back to baseline.  ? ?Plan for HD on 05/06  ? ?05/07 positive fever, sent for blood cultures.  ?05/08 afebrile.  ? ?Assessment and Plan: ?* ESRD (end stage renal disease) on dialysis [facility]) ?ESRD on HD, hyperkalemia, volume overload and acute pulmonary edema, Hyponatremia.  ? ?Patient clinically euvolemic. ?K os 4,5 and serum bicarbonate at 25, Na 134.  ? ?Continue HD per nephrology recommendations. ?Patient with homeless and will need place to go before discharge.  ? ?Anemia of chronic renal disease, continue with epo ?Metabolic bone  disease continue with cacitriol and sensipar.  ?Acute anemia and thrombocytopenia. Follow up hgb is 6,7 with plt 138  ?PRBC transfusion today. ? ?Patient has been afebrile today, will continue to follow up on blood cultures.  ? ? ? ?Acute on chronic diastolic CHF (congestive heart failure) ([place]) ?Decompensation due to renal failure and volume overload.  ? ?Echocardiogram from 2022 with preserved LV systolic function, moderate TR. Moderate elevation in pulmonary artery systolic pressure.  ? ?Improved volume status.  ?Tolerating well renal replacement therapy withy ultrafiltration.  ? ?Acute metabolic encephalopathy ?Uremic encephalopathy. ?BUN has improved after HD. ?Encephalopathy has resolved.  ? ?COPD (chronic obstructive pulmonary disease) ([place]) ?No clinical signs of exacerbation ? ? ?Paroxysmal atrial fibrillation (HCC) ?Continue with amiodarone ?Patient not on anticoagulation due to history of severe GI bleed.   ?Patient with drop in hgb but no clinical signs of recurrent GI bleeding, will continue close monitoring of H&H.  ? ?Hypertension ?Continue blood pressure control with amlodipine.  ? ?Dyslipidemia, continue with rosuvastatin,.  ? ?Substance abuse ([place]) ?Patient with no clinical signs of withdrawal. ?Currently is homeless, will need to follow up with social services before discharge.  ? ? ? ? ?  ? ?Subjective: Patient with no chest pain or dyspnea, no further fever and no chills.  ? ?Physical Exam: ?Vitals:  ? 11/15/21 0928 11/15/21 1115 11/15/21 1123 11/15/21 1142  ?BP: 123/69 130/74 130/74 122/64  ?Pulse: 73 75 75 71  ?Resp: '16 18 18 18  '$ ?Temp: 99.2 ?F (37.3 ?C) 98.8 ?F (37.1 ?C) 98.8 ?F (37.1 ?C)  98.6 ?F (37 ?C)  ?TempSrc: Oral Oral Oral Oral  ?SpO2: 97% 98%  97%  ?Weight:      ? ?Neurology awake and alert ?ENT with mild pallor ?Cardiovascular with S1 and S2 present and rhythmic with no gallops or murmurs ?Respiratory with no wheezing or rales ?Abdomen with no distention  ?No lower extremity edema   ?Data Reviewed: ? ? ? ?Family Communication: no family at the bedside  ? ?Disposition: ?Status is: Inpatient ?Remains inpatient appropriate because: pending placement  ? Planned Discharge Destination: Skilled nursing facility ? ? ? ? ?Author: ?Tawni Millers, MD ?11/15/2021 3:50 PM ? ?For on call review [email or web address].  ?

## 2021-11-15 NOTE — TOC Initial Note (Signed)
Transition of Care [facility]) - Initial/Assessment Note    Patient Details  Name: Raymond [name] MRN: [id number] Date of Birth: [birth year]  [facility]) CM/SW Contact:    Ralene Bathe, LCSWA Phone Number: 11/15/2021, 12:23 PM  Clinical Narrative:                 CSW met with the patient at bedside.  The patient reports being homeless and a recent stay at [facility] in March 2023.  CSW received SNF consult and inquired about whether the patient would be open to SNF placement should PT/OT recommend it.  The patient is open to SNF placement if needed and reports having 3 COVID vaccines.  The patient would like to return to [facility] if able.  CSW also explained the insurance authorization process.    CSW discussed the patient's plan should SNF not be recommended.  The patient reports that he does not have any family who can assist.  He receives $900 a month from retirement and social security.  He was staying at a hotel prior to admission, but now owes someone because he had to borrow money to stay there.  CSW inquired about the patient's willingness to go to  shelter.  The patient reports that this is not ideal because he receives dialysis TTS on [facility]. And is tired after treatment and shelters will not let him in early to rest afterwards.   MD notified that PT and OT orders have not been ordered and are needed for SNF placement.  Pending: PT and OT evaluations and recommendations.  SNF workup will be completed once recommendations are in.  CSW will continue to follow.    Expected Discharge Plan: Skilled Nursing Facility Barriers to Discharge: Continued Medical Work up, Homeless with medical needs, Family Issues, No SNF bed, Insurance Authorization   Patient Goals and CMS Choice Patient states their goals for this hospitalization and ongoing recovery are:: To go to rehab CMS Medicare.gov Compare Post Acute Care list provided to:: Patient Choice offered to / list  presented to : Patient  Expected Discharge Plan and Services Expected Discharge Plan: Skilled Nursing Facility       Living arrangements for the past 2 months: Homeless                                      Prior Living Arrangements/Services Living arrangements for the past 2 months: Homeless Lives with:: Self Patient language and need for interpreter reviewed:: Yes Do you feel safe going back to the place where you live?: No   Homeless  Need for Family Participation in Patient Care: Yes (Comment) Care giver support system in place?: No (comment)   Criminal Activity/Legal Involvement Pertinent to Current Situation/Hospitalization: No - Comment as needed  Activities of Daily Living   ADL Screening (condition at time of admission) Is the patient deaf or have difficulty hearing?: No Does the patient have difficulty seeing, even when wearing glasses/contacts?: Yes Does the patient have difficulty concentrating, remembering, or making decisions?: No Does the patient have difficulty dressing or bathing?: No Does the patient have difficulty walking or climbing stairs?: No  Permission Sought/Granted Permission sought to share information with : Other (comment) (CSW) Permission granted to share information with : Yes, Verbal Permission Granted     Permission granted to share info w AGENCY: SNFs or shelters  Emotional Assessment Appearance:: Appears older than stated age Attitude/Demeanor/Rapport: Engaged Affect (typically observed): Adaptable Orientation: : Oriented to Situation, Oriented to Place, Oriented to Self Alcohol / Substance Use: Illicit Drugs Psych Involvement: No (comment)  Admission diagnosis:  Hyperkalemia [E87.5] CHF (congestive heart failure) (HCC) [I50.9] Uremia [N19] Patient Active Problem List   Diagnosis Date Noted   Hypertension    Paroxysmal atrial fibrillation (HCC)    Substance abuse (HCC)    Thrombocytopenia (HCC) 09/15/2021    Hypertensive urgency 09/13/2021   Uncontrolled hypertension    Uremia    Severe episode of recurrent major depressive disorder, without psychotic features (HCC)    ESRD (end stage renal disease) on dialysis (HCC) 05/18/2021   Cardiac arrest (HCC) 04/03/2021   Anisocoria 04/03/2021   Acute on chronic diastolic CHF (congestive heart failure) (HCC) 04/03/2021   Hypothermia not due to cold exposure 04/03/2021   Junctional bradycardia 04/03/2021   Pancytopenia (HCC) 01/25/2021   AMS (altered mental status) 01/19/2021   Chronic viral hepatitis C (HCC) 12/25/2020   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 (HCC) 08/05/2020   Acute upper GI bleed 07/29/2020   Acute metabolic encephalopathy 07/29/2020   COVID-19 virus infection 07/29/2020   AVM (arteriovenous malformation) of stomach, acquired with hemorrhage    AVM (arteriovenous malformation) of small bowel, acquired with hemorrhage    Current use of long term anticoagulation    Sensorineural hearing loss (SNHL) of both ears 03/30/2020   History of arteriovenous malformation (AVM) 01/14/2020   UGIB (upper gastrointestinal bleed) 01/14/2020   Acute blood loss anemia 01/14/2020   Elevated troponin 01/14/2020   Allergy, unspecified, initial encounter 04/29/2019   Gastroenteritis 12/24/2018   Substance use disorder 12/24/2018   Type 2 diabetes mellitus with other diabetic kidney complication (HCC) 09/24/2018   Noncompliance of patient with renal dialysis 09/12/2018   Benign neoplasm of colon    Melena    Anemia    H/O medication noncompliance    Polysubstance (excluding opioids) dependence (HCC)    Atrial flutter with rapid ventricular response (HCC) 08/10/2018   Chest pain 08/10/2018   PAF (paroxysmal atrial fibrillation) (HCC) 08/01/2018   Acute respiratory failure with hypoxia (HCC) 07/13/2018   Non-compliance with renal dialysis 07/13/2018   PAD (peripheral artery disease) (HCC) 04/11/2018   Sepsis (HCC) 03/31/2018    Preop cardiovascular exam 03/29/2018   Idiopathic gout, unspecified site 12/13/2017   Other specified complication of vascular prosthetic devices, implants and grafts, initial encounter (HCC) 11/03/2017   Volume overload 08/07/2017   Dyspnea 06/20/2017   Acute bronchitis    COPD (chronic obstructive pulmonary disease) (HCC)    ESRD on hemodialysis (HCC)    Hypoxia 12/05/2016   Hyperkalemia 12/19/2015   Hypoglycemia 12/19/2015   Polysubstance abuse (HCC) 12/19/2015   Homeless 12/19/2015   Anemia associated with stage 5 chronic renal failure (HCC) 12/19/2015   BPH without urinary obstruction 10/29/2015   Erectile dysfunction 10/29/2015   Hematuria, gross 10/29/2015   Hypercalcemia 11/07/2014   Pruritus, unspecified 10/17/2014   Coagulation defect, unspecified (HCC) 09/17/2014   Thrombocytopenia, unspecified (HCC) 10/17/2013   Iron deficiency anemia, unspecified 08/28/2013   Atherosclerosis of native arteries of extremity with intermittent claudication (HCC) 12/04/2012   ESRD (end stage renal disease) (HCC) 12/04/2012   Hypertensive chronic kidney disease with stage 5 chronic kidney disease or end stage renal disease (HCC) 11/20/2012   Secondary hyperparathyroidism of renal origin (HCC) 11/20/2012   HEPATITIS C 09/07/2006   HYPERCHOLESTEROLEMIA 09/07/2006   HYPERTENSION, BENIGN SYSTEMIC  09/07/2006   PCP:  [name], FNP Pharmacy:   [facility] [address] [place] [address] Phone: [phone] Fax: [phone]  [id number] - [place], [place] - [address] AT [facility] [address] [place] [address] Phone: [phone] Fax: [phone]     Social Determinants of Health (SDOH) Interventions    Readmission Risk Interventions    06/22/2020    3:40 PM  Readmission Risk Prevention Plan  Transportation Screening Complete  Medication Review (RN Care Manager) Complete   PCP or Specialist appointment within 3-5 days of discharge Complete  HRI or Home Care Consult Complete  SW Recovery Care/Counseling Consult Complete  Palliative Care Screening Not Applicable  Skilled Nursing Facility Patient Refused

## 2021-11-15 NOTE — Care Management Important Message (Signed)
Important Message ? ?Patient Details  ?Name: Raymond [name] ?MRN: [id number] ?Date of Birth: 27-Apr-1956 ? ? ?Medicare Important Message Given:  Yes ? ? ? ? ?Alishah Schulte ?11/15/2021, 3:38 PM ?

## 2021-11-15 NOTE — Progress Notes (Signed)
?[facility] ?Progress Note  ? ?Subjective:   Seen in room. Reports he was nauseous yesterday but feels better today. Denies SOB, CP, dizziness, abdominal pain, diarrhea. Noted Hgb 6.7 and low grade fever overnight.  ? ?Objective ?Vitals:  ? 11/14/21 1244 11/14/21 1617 11/14/21 2053 11/15/21 0540  ?BP: 109/70 135/78 128/72 127/80  ?Pulse: 77 84 79 77  ?Resp:  '18 18 18  '$ ?Temp: 99.8 ?F (37.7 ?C) (!) 100.9 ?F (38.3 ?C) 98.2 ?F (36.8 ?C) 99.2 ?F (37.3 ?C)  ?TempSrc: Oral Oral    ?SpO2: 96%  98% 100%  ?Weight:      ? ?Physical Exam ?General: Well developed, alert male in NAD ?Heart: RRR, no murmurs, rubs or gallops ?Lungs: CTA bilaterally without wheezing, rhonchi or rales ?Abdomen: Soft, non-tender, non-distended, +BS ?Extremities: No edema bilateral lower extremities ?Dialysis Access:  LUE AVF + bruit, aneurysmal  ? ?Additional Objective ?Labs: ?Basic Metabolic Panel: ?Recent Labs  ?Lab 11/11/21 ?2021 11/12/21 ?3790 11/13/21 ?2409 11/15/21 ?0456  ?NA  --  137 139 134*  ?K  --  4.9 4.4 4.5  ?CL  --  98 96* 100  ?CO2  --  '26 29 25  '$ ?GLUCOSE  --  70 108* 101*  ?BUN  --  53* 73* 42*  ?CREATININE  --  12.41* 14.41* 10.72*  ?CALCIUM  --  9.6 9.1 8.1*  ?PHOS 5.4*  --   --  4.2  ? ?Liver Function Tests: ?Recent Labs  ?Lab 11/11/21 ?1034 11/15/21 ?0456  ?AST <5*  --   ?ALT 7  --   ?ALKPHOS 47  --   ?BILITOT 0.4  --   ?PROT 7.0  --   ?ALBUMIN 2.8* 2.3*  ? ?No results for input(s): LIPASE, AMYLASE in the last 168 hours. ?CBC: ?Recent Labs  ?Lab 11/11/21 ?1034 11/15/21 ?0456  ?WBC 3.7* 2.5*  ?NEUTROABS 2.3 1.4*  ?HGB 8.1* 6.7*  ?HCT 26.2* 21.2*  ?MCV 99.6 98.6  ?PLT 158 138*  ? ?Blood Culture ?   ?Component Value Date/Time  ? SDES BLOOD RIGHT HAND 04/13/2021 0600  ? SPECREQUEST  04/13/2021 0600  ?  BOTTLES DRAWN AEROBIC AND ANAEROBIC Blood Culture adequate volume  ? CULT  04/13/2021 0600  ?  NO GROWTH 5 DAYS ?Performed at [facility], [place] [address], [place], [place] [address] ?  ? REPTSTATUS 04/18/2021  FINAL 04/13/2021 0600  ? ? ?Cardiac Enzymes: ?No results for input(s): CKTOTAL, CKMB, CKMBINDEX, TROPONINI in the last 168 hours. ?CBG: ?Recent Labs  ?Lab 11/11/21 ?1152 11/11/21 ?1231 11/11/21 ?1806 11/11/21 ?1900 11/11/21 ?1910  ?GLUCAP 64* 48* 45* 73 88  ? ?Iron Studies: No results for input(s): IRON, TIBC, TRANSFERRIN, FERRITIN in the last 72 hours. ?'@lablastinr3'$ @ ?Studies/Results: ?No results found. ?Medications: ? sodium chloride    ? sodium chloride    ? sodium chloride    ? ? sodium chloride   Intravenous Once  ? sodium chloride   Intravenous Once  ? amiodarone  200 mg Oral Daily  ? amLODipine  5 mg Oral QHS  ? aspirin EC  81 mg Oral Daily  ? calcitRIOL  0.5 mcg Oral Q T,Th,Sa-HD  ? calcitRIOL  2 mcg Oral Q T,Th,Sa-HD  ? carvedilol  12.5 mg Oral BID  ? cinacalcet  180 mg Oral Q T,Th,Sa-HD  ? [START ON 11/18/2021] darbepoetin (ARANESP) injection - DIALYSIS  200 mcg Intravenous Q Thu-HD  ? fluticasone furoate-vilanterol  1 puff Inhalation Daily  ? folic acid  1 mg Oral Daily  ?  heparin  5,000 Units Subcutaneous Q12H  ? pantoprazole  40 mg Oral BID AC  ? rosuvastatin  10 mg Oral Daily  ? sevelamer carbonate  4,000 mg Oral TID WC  ? sodium chloride flush  3 mL Intravenous Q12H  ? thiamine  100 mg Oral Daily  ? umeclidinium bromide  1 puff Inhalation Daily  ? ? ?Dialysis Orders: ?[facility] TTS ?4h  400/1.5  68kg    2/2 bath  LFA AVF  Hep none  ? - last HD 4/22, post HD 68kg ? - last Hb 8.6 on 4/20 ? - mircera 200 ug q2, last 4/13, was due 4/26 ? - venofer 50 q wk ? - rocaltrol 0.5 ug tiw po ? ?Assessment/Plan: ?Uremia - w/ azotemia, creat 28. Missed HD x 2 wks. Pt is homeless, see below ?Homelessness - major issue w/ HD pt, he may need assistance with placement, discussed with SW and Renal Navigator. Housing resources previously provided to patient in outpatient HD center. SW already aware he may need additional resources prior to dc. ?Fever- new onset, blood cultures ordered. Work up per primary team.  ?ESRD - on HD  TTS. Tolerated HD Saturday with net UF  3.5L. Pt getting closer to his EDW. Plan for HD tomorrow. ?Hyperkalemia - K+ 7.2 at admit due to missed HD, now resolved ?HTN /vol - BP's improving, CXR neg, Cont home meds. Volume status is improving. Continue UF with HD as tolerated ?Atrial Fib - on coreg, eliquis ?MBD ckd - Calcium and phosphorus controlled, continue binder and VDRA ?Anemia ckd - Hgb declined to 6.7. 1 unit PRBC ordered. ESA ordered weekly on Thursdays while here.  ?Dispo-needs SW needs to follow-up before discharge to ensure patient has adequate housing. Patient may require placement. ? ?Anice Paganini, PA-C ?11/15/2021, 8:29 AM  ?[facility] ?Pager: [phone] ? ? ?

## 2021-11-16 ENCOUNTER — Encounter (HOSPITAL_COMMUNITY): Payer: Self-pay | Admitting: Internal Medicine

## 2021-11-16 LAB — CBC
HCT: 21.2 % — ABNORMAL LOW (ref 39.0–52.0)
Hemoglobin: 6.9 g/dL — CL (ref 13.0–17.0)
MCH: 31.4 pg (ref 26.0–34.0)
MCHC: 32.5 g/dL (ref 30.0–36.0)
MCV: 96.4 fL (ref 80.0–100.0)
Platelets: 140 10*3/uL — ABNORMAL LOW (ref 150–400)
RBC: 2.2 MIL/uL — ABNORMAL LOW (ref 4.22–5.81)
RDW: 18.6 % — ABNORMAL HIGH (ref 11.5–15.5)
WBC: 3.5 10*3/uL — ABNORMAL LOW (ref 4.0–10.5)
nRBC: 0 % (ref 0.0–0.2)

## 2021-11-16 LAB — RENAL FUNCTION PANEL
Albumin: 2.2 g/dL — ABNORMAL LOW (ref 3.5–5.0)
Anion gap: 12 (ref 5–15)
BUN: 54 mg/dL — ABNORMAL HIGH (ref 8–23)
CO2: 26 mmol/L (ref 22–32)
Calcium: 8 mg/dL — ABNORMAL LOW (ref 8.9–10.3)
Chloride: 97 mmol/L — ABNORMAL LOW (ref 98–111)
Creatinine, Ser: 13.14 mg/dL — ABNORMAL HIGH (ref 0.61–1.24)
GFR, Estimated: 4 mL/min — ABNORMAL LOW (ref >60)
Glucose, Bld: 100 mg/dL — ABNORMAL HIGH (ref 70–99)
Phosphorus: 4.5 mg/dL (ref 2.5–4.6)
Potassium: 4.6 mmol/L (ref 3.5–5.1)
Sodium: 135 mmol/L (ref 135–145)

## 2021-11-16 LAB — PREPARE RBC (CROSSMATCH)

## 2021-11-16 MED ORDER — RENA-VITE PO TABS
1.0000 | ORAL_TABLET | Freq: Every day | ORAL | Status: DC
  Administered 2021-11-16 – 2021-11-30 (×15): 1 via ORAL
  Filled 2021-11-16 (×15): qty 1

## 2021-11-16 MED ORDER — NEPRO/CARBSTEADY PO LIQD
237.0000 mL | Freq: Two times a day (BID) | ORAL | Status: DC
  Administered 2021-11-16 – 2021-11-24 (×13): 237 mL via ORAL

## 2021-11-16 MED ORDER — SODIUM CHLORIDE 0.9% IV SOLUTION: Freq: Once | INTRAVENOUS | Status: DC

## 2021-11-16 NOTE — Progress Notes (Signed)
OT Cancellation Note ? ?Patient Details ?Name: Raymond [name] ?MRN: [id number] ?DOB: 1956-04-17 ? ? ?Cancelled Treatment:    Reason Eval/Treat Not Completed: Other (comment) (Pt in dialysis)  Will check back later if time permits, otherwise will see in the am on 5/9 for eval. ? ?Kylon Philbrook OTR/L ?11/16/2021, 11:32 AM ?

## 2021-11-16 NOTE — Progress Notes (Signed)
?[facility] ?Progress Note  ? ?Subjective:   Seen in dialysis.  Denies SOB, CP, dizziness, abdominal pain, diarrhea. Noted Hgb 6.9 after transfusion yesterday.  Had significant epistaxis yesterday, now improved.  Says that doesn't typically occur. ? ?Objective ?Vitals:  ? 11/16/21 0700 11/16/21 0706 11/16/21 0730 11/16/21 0800  ?BP: 134/73 138/88 125/75 114/74  ?Pulse: 70 66 66 65  ?Resp:      ?Temp: 97.8 ?F (36.6 ?C)     ?TempSrc:      ?SpO2:      ?Weight:      ?Height:      ? ?Physical Exam ?General: Well developed, alert male in NAD ?Heart: RRR, no murmurs, rubs or gallops ?Lungs: CTA bilaterally without wheezing, rhonchi or rales ?Abdomen: Soft, non-tender, non-distended, +BS ?Extremities: No edema bilateral lower extremities ?Dialysis Access:  LUE AVF + bruit, aneurysmal  ? ?Additional Objective ?Labs: ?Basic Metabolic Panel: ?Recent Labs  ?Lab 11/11/21 ?2021 11/12/21 ?6063 11/13/21 ?0160 11/15/21 ?1093 11/16/21 ?0725  ?NA  --    < > 139 134* 135  ?K  --    < > 4.4 4.5 4.6  ?CL  --    < > 96* 100 97*  ?CO2  --    < > '29 25 26  '$ ?GLUCOSE  --    < > 108* 101* 100*  ?BUN  --    < > 73* 42* 54*  ?CREATININE  --    < > 14.41* 10.72* 13.14*  ?CALCIUM  --    < > 9.1 8.1* 8.0*  ?PHOS 5.4*  --   --  4.2 4.5  ? < > = values in this interval not displayed.  ? ? ?Liver Function Tests: ?Recent Labs  ?Lab 11/11/21 ?1034 11/15/21 ?0456 11/16/21 ?0725  ?AST <5*  --   --   ?ALT 7  --   --   ?ALKPHOS 47  --   --   ?BILITOT 0.4  --   --   ?PROT 7.0  --   --   ?ALBUMIN 2.8* 2.3* 2.2*  ? ? ?No results for input(s): LIPASE, AMYLASE in the last 168 hours. ?CBC: ?Recent Labs  ?Lab 11/11/21 ?1034 11/15/21 ?0456 11/16/21 ?0725  ?WBC 3.7* 2.5* 3.5*  ?NEUTROABS 2.3 1.4*  --   ?HGB 8.1* 6.7* 6.9*  ?HCT 26.2* 21.2* 21.2*  ?MCV 99.6 98.6 96.4  ?PLT 158 138* 140*  ? ? ?Blood Culture ?   ?Component Value Date/Time  ? SDES BLOOD RIGHT HAND 11/14/2021 1616  ? SDES BLOOD RIGHT WRIST 11/14/2021 1616  ? SPECREQUEST  11/14/2021 1616   ?  BOTTLES DRAWN AEROBIC ONLY Blood Culture results may not be optimal due to an inadequate volume of blood received in culture bottles  ? SPECREQUEST  11/14/2021 1616  ?  BOTTLES DRAWN AEROBIC AND ANAEROBIC Blood Culture results may not be optimal due to an inadequate volume of blood received in culture bottles  ? CULT  11/14/2021 1616  ?  NO GROWTH < 24 HOURS ?Performed at [facility], [place] [address] [place], [place] [address] ?  ? CULT  11/14/2021 1616  ?  NO GROWTH < 24 HOURS ?Performed at [facility], [place] [address] [place], [place] [address] ?  ? REPTSTATUS PENDING 11/14/2021 1616  ? REPTSTATUS PENDING 11/14/2021 1616  ? ? ?Cardiac Enzymes: ?No results for input(s): CKTOTAL, CKMB, CKMBINDEX, TROPONINI in the last 168 hours. ?CBG: ?Recent Labs  ?Lab 11/11/21 ?1152 11/11/21 ?1231 11/11/21 ?1806  11/11/21 ?1900 11/11/21 ?1910  ?GLUCAP 64* 48* 45* 73 88  ? ? ?Iron Studies: No results for input(s): IRON, TIBC, TRANSFERRIN, FERRITIN in the last 72 hours. ?'@lablastinr3'$ @ ?Studies/Results: ?No results found. ?Medications: ? sodium chloride    ? sodium chloride    ? sodium chloride    ? ? sodium chloride   Intravenous Once  ? sodium chloride   Intravenous Once  ? amiodarone  200 mg Oral Daily  ? amLODipine  5 mg Oral QHS  ? aspirin EC  81 mg Oral Daily  ? calcitRIOL  0.5 mcg Oral Q T,Th,Sa-HD  ? calcitRIOL  2 mcg Oral Q T,Th,Sa-HD  ? carvedilol  12.5 mg Oral BID  ? Chlorhexidine Gluconate Cloth  6 each Topical Q0600  ? cinacalcet  180 mg Oral Q T,Th,Sa-HD  ? [START ON 11/18/2021] darbepoetin (ARANESP) injection - DIALYSIS  200 mcg Intravenous Q Thu-HD  ? fluticasone furoate-vilanterol  1 puff Inhalation Daily  ? folic acid  1 mg Oral Daily  ? heparin  5,000 Units Subcutaneous Q12H  ? pantoprazole  40 mg Oral BID AC  ? rosuvastatin  10 mg Oral Daily  ? sevelamer carbonate  4,000 mg Oral TID WC  ? sodium chloride flush  3 mL Intravenous Q12H  ? thiamine  100 mg Oral Daily  ? umeclidinium bromide  1 puff  Inhalation Daily  ? ? ?Dialysis Orders: ?[facility] TTS ?4h  400/1.5  68kg    2/2 bath  LFA AVF  Hep none  ? - last HD 4/22, post HD 68kg ? - last Hb 8.6 on 4/20 ? - mircera 200 ug q2, last 4/13, was due 4/26 ? - venofer 50 q wk ? - rocaltrol 0.5 ug tiw po ? ?Assessment/Plan: ?Uremia - w/ azotemia, creat 28. Missed HD x 2 wks. Pt is homeless, see below ?Homelessness - major issue w/ HD pt, he may need assistance with placement, discussed with SW and Renal Navigator. Housing resources previously provided to patient in outpatient HD center. SW already aware he may need additional resources prior to dc. ?Fever- new onset, blood cultures NGTD. Work up per primary team. Exam not localizing any infection. ?ESRD - on HD TTS.  Down to EDW ?Hyperkalemia - K+ 7.2 at admit due to missed HD, now resolved ?HTN /vol - BP's improved, CXR neg, Cont home meds. Volume status is euvolemic now. Continue UF with HD as tolerated ?Atrial Fib - on coreg, eliquis ?MBD ckd - Calcium and phosphorus controlled, continue binder and VDRA ?Anemia ckd - Hgb declined to 6.7. 1 unit PRBC given yesterday with Hb 6.9 this AM. ESA ordered weekly on Thursdays while here.   Had epistaxis so presume some acute blood loss contributing.  Messaged hospitalist - if  additional unit ordered we can give in HD if ready. ?Dispo- SW working on placement  ? ?Jannifer Hick MD ?[facility] ?Pager [phone] ? ? ? ?

## 2021-11-16 NOTE — Progress Notes (Signed)
Initial Nutrition Assessment ? ?DOCUMENTATION CODES:  ? ?Not applicable ? ?INTERVENTION:  ? ?Add Renal MVI daily ? ?Nepro Shake po BID, each supplement provides 425 kcal and 19 grams protein. Follow-up for tolerance and adjust supplement as needed ? ? ?NUTRITION DIAGNOSIS:  ? ?Increased nutrient needs related to chronic illness as evidenced by estimated needs. ? ?GOAL:  ? ?Patient will meet greater than or equal to 90% of their needs ? ? ?MONITOR:  ? ?PO intake, Supplement acceptance, Weight trends, Labs ? ?REASON FOR ASSESSMENT:  ? ?Malnutrition Screening Tool ?  ? ?ASSESSMENT:  ? ?66 yo male admitted with fever, volume overload, uremia with azotemia, hyperkalemia and Creatinine 28 after missing HD x 2 weeks-pt is homeless. PMH includes ESRD on HD, COPD, CHF, HTN, +crack cocaine use. ? ?Discharge plan pending; pt currently homeless but open to SNF if PT/OT recommend it.  ? ?Recorded po intake 75-100% of meals ?Unable to obtain complete diet and weight history as pt unavailable upon assessment.  ? ?Outpatient EDW: 68 kg ?Current wt 68.3 kg prior to iHD today, post HD weight pending. Noted post iHD weight of 65.8 kg on 5/06. Post iHD weight lower than EDW suggesting true weight loss ? ?Potassium improved from 7.4 to 4.6 post dialysis treatments, Phosphorus also improved ? ?Labs: phosphorus 4.5 (wdl), HGb 6.9, potassium 4.6 (Wdl) ?Meds: calcitriol, sensipar, aranesp, thiamine, folic acid, renvela  ? ? ?NUTRITION - FOCUSED PHYSICAL EXAM: ? ?Unable to assess ? ?Diet Order:   ?Diet Order   ? ?       ?  Diet Heart Room service appropriate? Yes; Fluid consistency: Thin; Fluid restriction: 1500 mL Fluid  Diet effective now       ?  ? ?  ?  ? ?  ? ? ?EDUCATION NEEDS:  ? ?Not appropriate for education at this time ? ?Skin:  Skin Assessment: Reviewed RN Assessment ? ?Last BM:  5/4 ? ?Height:  ? ?Ht Readings from Last 1 Encounters:  ?11/16/21 '5\' 8"'$  (1.727 m)  ? ? ?Weight:  ? ?Wt Readings from Last 1 Encounters:  ?11/16/21 68.3  kg  ? ? ?BMI:  Body mass index is 22.89 kg/m?. ? ?Estimated Nutritional Needs:  ? ?Kcal:  2000-2300 kcals, ? ?Protein:  100-120 g ? ?Fluid:  1000 mL plus UOP ? ? ?Kerman Passey MS, RDN, LDN, CNSC ?Registered Dietitian III ?Clinical Nutrition ?RD Pager and On-Call Pager Number Located in [place]  ? ?

## 2021-11-16 NOTE — Progress Notes (Signed)
removed 2024ms net fluid. unable to remove more due to hypotension.   pre bp 125/75 post 96/52  pre weight 68.3kg post 66.0kg , gave calcitriol and sensipar as ordered.  2 bandages to lua no bleeding dressing cdi.  gave 1 unit of blood as ordered. n ocomplaints ?

## 2021-11-16 NOTE — Progress Notes (Signed)
PT Cancellation Note ? ?Patient Details ?Name: Raymond [name] ?MRN: [id number] ?DOB: 1955-07-17 ? ? ?Cancelled Treatment:    Reason Eval/Treat Not Completed: Patient at procedure or test/unavailable ? ?Currently in HD; ? ?Will follow up later today as time allows;  ?Otherwise, will follow up for PT tomorrow;  ? ?Thank you,  ?Roney Marion, PT  ?Acute Rehabilitation Services ?Pager [phone] ?Office [phone] ? ? ?Colletta [place] ?11/16/2021, 10:11 AM ?

## 2021-11-16 NOTE — Plan of Care (Signed)
  Problem: Clinical Measurements: Goal: Respiratory complications will improve Outcome: Progressing   Problem: Clinical Measurements: Goal: Cardiovascular complication will be avoided Outcome: Progressing   Problem: Nutrition: Goal: Adequate nutrition will be maintained Outcome: Progressing   

## 2021-11-17 DIAGNOSIS — Z992 Dependence on renal dialysis: Secondary | ICD-10-CM | POA: Diagnosis not present

## 2021-11-17 DIAGNOSIS — N186 End stage renal disease: Secondary | ICD-10-CM | POA: Diagnosis not present

## 2021-11-17 LAB — BPAM RBC
Blood Product Expiration Date: 202305222359
Blood Product Expiration Date: 202305222359
ISSUE DATE / TIME: 202305081117
ISSUE DATE / TIME: 202305090913
Unit Type and Rh: 7300
Unit Type and Rh: 7300

## 2021-11-17 LAB — BASIC METABOLIC PANEL
Anion gap: 7 (ref 5–15)
BUN: 19 mg/dL (ref 8–23)
CO2: 30 mmol/L (ref 22–32)
Calcium: 8.3 mg/dL — ABNORMAL LOW (ref 8.9–10.3)
Chloride: 98 mmol/L (ref 98–111)
Creatinine, Ser: 6.41 mg/dL — ABNORMAL HIGH (ref 0.61–1.24)
GFR, Estimated: 9 mL/min — ABNORMAL LOW (ref >60)
Glucose, Bld: 89 mg/dL (ref 70–99)
Potassium: 4.1 mmol/L (ref 3.5–5.1)
Sodium: 135 mmol/L (ref 135–145)

## 2021-11-17 LAB — TYPE AND SCREEN
ABO/RH(D): B POS
Antibody Screen: NEGATIVE
Unit division: 0
Unit division: 0

## 2021-11-17 LAB — CBC
HCT: 27.6 % — ABNORMAL LOW (ref 39.0–52.0)
Hemoglobin: 9 g/dL — ABNORMAL LOW (ref 13.0–17.0)
MCH: 30.7 pg (ref 26.0–34.0)
MCHC: 32.6 g/dL (ref 30.0–36.0)
MCV: 94.2 fL (ref 80.0–100.0)
Platelets: 179 10*3/uL (ref 150–400)
RBC: 2.93 MIL/uL — ABNORMAL LOW (ref 4.22–5.81)
RDW: 17.9 % — ABNORMAL HIGH (ref 11.5–15.5)
WBC: 3.9 10*3/uL — ABNORMAL LOW (ref 4.0–10.5)
nRBC: 0 % (ref 0.0–0.2)

## 2021-11-17 NOTE — Progress Notes (Signed)
?[facility] ?Progress Note  ? ?Subjective:  Seen in room. No overnight issues. No CP/dyspnea. Dialyzed yesterday - 2L off. Dispo pending, awaiting PT eval to determine if eligible for SNF. ? ?Objective ?Vitals:  ? 11/16/21 1748 11/16/21 2118 11/17/21 0541 11/17/21 0834  ?BP: 121/75 112/65 130/74 109/66  ?Pulse: 67 68 66 71  ?Resp: '16 16 17 16  '$ ?Temp: 98 ?F (36.7 ?C) 97.7 ?F (36.5 ?C) 98.3 ?F (36.8 ?C) 97.8 ?F (36.6 ?C)  ?TempSrc: Oral Oral Oral Oral  ?SpO2: 99% 99% 97% 96%  ?Weight:      ?Height:      ? ?Physical Exam ?General: Chronically ill appearing, NAD. Room air. ?Heart: RRR; no murmur ?Lungs: CTAB; no wheezing ?Abdomen: soft ?Extremities: No LE edema ?Dialysis Access: LUE AVF + thrill ? ?Additional Objective ?Labs: ?Basic Metabolic Panel: ?Recent Labs  ?Lab 11/11/21 ?2021 11/12/21 ?6644 11/15/21 ?0347 11/16/21 ?0725 11/17/21 ?0350  ?NA  --    < > 134* 135 135  ?K  --    < > 4.5 4.6 4.1  ?CL  --    < > 100 97* 98  ?CO2  --    < > '25 26 30  '$ ?GLUCOSE  --    < > 101* 100* 89  ?BUN  --    < > 42* 54* 19  ?CREATININE  --    < > 10.72* 13.14* 6.41*  ?CALCIUM  --    < > 8.1* 8.0* 8.3*  ?PHOS 5.4*  --  4.2 4.5  --   ? < > = values in this interval not displayed.  ? ?Liver Function Tests: ?Recent Labs  ?Lab 11/11/21 ?1034 11/15/21 ?0456 11/16/21 ?0725  ?AST <5*  --   --   ?ALT 7  --   --   ?ALKPHOS 47  --   --   ?BILITOT 0.4  --   --   ?PROT 7.0  --   --   ?ALBUMIN 2.8* 2.3* 2.2*  ? ?CBC: ?Recent Labs  ?Lab 11/11/21 ?1034 11/15/21 ?0456 11/16/21 ?0725 11/17/21 ?0350  ?WBC 3.7* 2.5* 3.5* 3.9*  ?NEUTROABS 2.3 1.4*  --   --   ?HGB 8.1* 6.7* 6.9* 9.0*  ?HCT 26.2* 21.2* 21.2* 27.6*  ?MCV 99.6 98.6 96.4 94.2  ?PLT 158 138* 140* 179  ? ?Medications: ? sodium chloride    ? ? sodium chloride   Intravenous Once  ? sodium chloride   Intravenous Once  ? sodium chloride   Intravenous Once  ? amiodarone  200 mg Oral Daily  ? amLODipine  5 mg Oral QHS  ? aspirin EC  81 mg Oral Daily  ? calcitRIOL  0.5 mcg Oral Q  T,Th,Sa-HD  ? carvedilol  12.5 mg Oral BID  ? Chlorhexidine Gluconate Cloth  6 each Topical Q0600  ? cinacalcet  180 mg Oral Q T,Th,Sa-HD  ? [START ON 11/18/2021] darbepoetin (ARANESP) injection - DIALYSIS  200 mcg Intravenous Q Thu-HD  ? feeding supplement (NEPRO CARB STEADY)  237 mL Oral BID BM  ? fluticasone furoate-vilanterol  1 puff Inhalation Daily  ? folic acid  1 mg Oral Daily  ? heparin  5,000 Units Subcutaneous Q12H  ? multivitamin  1 tablet Oral QHS  ? pantoprazole  40 mg Oral BID AC  ? rosuvastatin  10 mg Oral Daily  ? sevelamer carbonate  4,000 mg Oral TID WC  ? sodium chloride flush  3 mL Intravenous Q12H  ? thiamine  100 mg Oral Daily  ?  umeclidinium bromide  1 puff Inhalation Daily  ? ? ?Dialysis Orders: ?[facility] TTS ?4h  400/1.5  68kg 2/2 bath  LFA AVF  Hep none  ? - last HD 4/22, post HD 68kg ? - last Hb 8.6 on 4/20 ? - mircera 251mg q2, last 4/13, was due 4/26 ? - venofer 50 q wk ? - calcitriol 0.540m PO q HD ?  ?Assessment/Plan: ?Uremia - w/ azotemia, creat 28. Missed HD x 2 wks. Pt is homeless, see below. ?Homelessness - major issue w/ HD pt, he may need assistance with placement, SW/case manager aware. Housing resources previously provided to patient in outpatient HD center.  ?Fever: On admit, blood cultures NGTD. ?ESRD - on HD TTS.  Next HD tomorrow (5/11) ?Hyperkalemia: K 7.2 on admit in setting of missed HD, now normalized.  ?HTN/volume: BP and edema improved. Back to prior dry weight.  ?Atrial Fib - on coreg, eliquis ?Secondary HPTH: Ca/Phos ok, continue home binders/VDRA.  ?Anemia of ESRD: Hgb improved today - 9. S/p 2U PRBCs this admit. Continue Aranesp q Thursday while here. S/p episode epistaxis this admit. ?Dispo- SW working on placement  ?  ? ?KaVeneta PentonPA-C ?11/17/2021, 9:57 AM  ?[facility] ? ? ? ?

## 2021-11-17 NOTE — TOC Progression Note (Addendum)
Transition of Care (TOC) - Initial/Assessment Note  ? ? ?Patient Details  ?Name: Raymond [name] ?MRN: [id number] ?Date of Birth: [birth year] ? ?Transition of Care (TOC) CM/SW Contact:    ?Catalina Pizza Klye Besecker, LCSWA ?Phone Number: ?11/17/2021, 2:57 PM ? ?Clinical Narrative:                 ?CSW contacted [name].  The patient's agency of choice to inquire about the facility's ability to accept the patient and is awaiting a response.   ? ?CSW also requested that insurance authorization for SNF be initiated with SNF pending until one is confirmed.   ? ?1700-  CSW contacted admissions with [place] to follow up on referral and was informed that the facility is unable to accept the patient.  The patient has been to the facility numerous times and has left AMA 3 times.   ? ?CSW has also been informed that the social worker at the dialysis clinic has given the patient resources for housing and the patient has been set up with Medicaid transportation.  ? ?No other facilities have accepted due to being unable to meet the patient's needs. ? ?TOC will continue to follow.    ? ? ?Expected Discharge Plan: Skilled Nursing Facility ?Barriers to Discharge: Continued Medical Work up, Homeless with medical needs, Family Issues, No SNF bed, Insurance Authorization ? ? ?Patient Goals and CMS Choice ?Patient states their goals for this hospitalization and ongoing recovery are:: To go to rehab ?CMS Medicare.gov Compare Post Acute Care list provided to:: Patient ?Choice offered to / list presented to : Patient ? ?Expected Discharge Plan and Services ?Expected Discharge Plan: Skilled Nursing Facility ?  ?  ?  ?Living arrangements for the past 2 months: Homeless ?                ?  ?  ?  ?  ?  ?  ?  ?  ?  ?  ? ?Prior Living Arrangements/Services ?Living arrangements for the past 2 months: Homeless ?Lives with:: Self ?Patient language and need for interpreter reviewed:: Yes ?Do you feel safe going back to the place where you live?: No    Homeless  ?Need for Family Participation in Patient Care: Yes (Comment) ?Care giver support system in place?: No (comment) ?  ?Criminal Activity/Legal Involvement Pertinent to Current Situation/Hospitalization: No - Comment as needed ? ?Activities of Daily Living ?Home Assistive Devices/Equipment: None ?ADL Screening (condition at time of admission) ?Patient's cognitive ability adequate to safely complete daily activities?: Yes ?Is the patient deaf or have difficulty hearing?: No ?Does the patient have difficulty seeing, even when wearing glasses/contacts?: Yes ?Does the patient have difficulty concentrating, remembering, or making decisions?: No ?Patient able to express need for assistance with ADLs?: Yes ?Does the patient have difficulty dressing or bathing?: No ?Independently performs ADLs?: Yes (appropriate for developmental age) ?Does the patient have difficulty walking or climbing stairs?: No ?Weakness of Legs: Both ?Weakness of Arms/Hands: None ? ?Permission Sought/Granted ?Permission sought to share information with : Other (comment) (CSW) ?Permission granted to share information with : Yes, Verbal Permission Granted ?   ? Permission granted to share info w AGENCY: SNFs or shelters ?   ?   ? ?Emotional Assessment ?Appearance:: Appears older than stated age ?Attitude/Demeanor/Rapport: Engaged ?Affect (typically observed): Adaptable ?Orientation: : Oriented to Situation, Oriented to Place, Oriented to Self ?Alcohol / Substance Use: Illicit Drugs ?Psych Involvement: No (comment) ? ?Admission diagnosis:  Hyperkalemia [E87.5] ?CHF (congestive heart  failure) (HCC) [I50.9] ?Uremia [N19] ?Patient Active Problem List  ? Diagnosis Date Noted  ? Hypertension   ? Paroxysmal atrial fibrillation (HCC)   ? Substance abuse (HCC)   ? Thrombocytopenia (HCC) 09/15/2021  ? Hypertensive urgency 09/13/2021  ? Uncontrolled hypertension   ? Uremia   ? Severe episode of recurrent major depressive disorder, without psychotic features  (HCC)   ? ESRD (end stage renal disease) on dialysis (HCC) 05/18/2021  ? Cardiac arrest (HCC) 04/03/2021  ? Anisocoria 04/03/2021  ? Acute on chronic diastolic CHF (congestive heart failure) (HCC) 04/03/2021  ? Hypothermia not due to cold exposure 04/03/2021  ? Junctional bradycardia 04/03/2021  ? Pancytopenia (HCC) 01/25/2021  ? AMS (altered mental status) 01/19/2021  ? 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 Acute upper GI bleed 07/29/2020  ? Acute metabolic encephalopathy 07/29/2020  ? COVID-19 virus infection 07/29/2020  ? AVM (arteriovenous malformation) of stomach, acquired with hemorrhage   ? AVM (arteriovenous malformation) of small bowel, acquired with hemorrhage   ? Current use of long term anticoagulation   ? Sensorineural hearing loss (SNHL) of both ears 03/30/2020  ? History of arteriovenous malformation (AVM) 01/14/2020  ? UGIB (upper gastrointestinal bleed) 01/14/2020  ? Acute blood loss anemia 01/14/2020  ? Elevated troponin 01/14/2020  ? Allergy, unspecified, initial encounter 04/29/2019  ? Gastroenteritis 12/24/2018  ? Substance use disorder 12/24/2018  ? Type 2 diabetes mellitus with other diabetic kidney complication (HCC) 09/24/2018  ? Noncompliance of patient with renal dialysis 09/12/2018  ? Benign neoplasm of colon   ? Melena   ? Anemia   ? H/O medication noncompliance   ? Polysubstance (excluding opioids) dependence (HCC)   ? Atrial flutter with rapid ventricular response (HCC) 08/10/2018  ? Chest pain 08/10/2018  ? PAF (paroxysmal atrial fibrillation) (HCC) 08/01/2018  ? Acute respiratory failure with hypoxia (HCC) 07/13/2018  ? Non-compliance with renal dialysis 07/13/2018  ? PAD (peripheral artery disease) (HCC) 04/11/2018  ? Sepsis (HCC) 03/31/2018  ? Preop cardiovascular exam 03/29/2018  ? Idiopathic gout, unspecified site 12/13/2017  ? Other specified complication of vascular prosthetic devices, implants and  grafts, initial encounter (HCC) 11/03/2017  ? Volume overload 08/07/2017  ? Dyspnea 06/20/2017  ? Acute bronchitis   ? COPD (chronic obstructive pulmonary disease) (HCC)   ? ESRD on hemodialysis (HCC)   ? Hypoxia 12/05/2016  ? Hyperkalemia 12/19/2015  ? Hypoglycemia 12/19/2015  ? Polysubstance abuse (HCC) 12/19/2015  ? Homeless 12/19/2015  ? Anemia associated with stage 5 chronic renal failure (HCC) 12/19/2015  ? BPH without urinary obstruction 10/29/2015  ? Erectile dysfunction 10/29/2015  ? Hematuria, gross 10/29/2015  ? Hypercalcemia 11/07/2014  ? Pruritus, unspecified 10/17/2014  ? Coagulation defect, unspecified (HCC) 09/17/2014  ? Thrombocytopenia, unspecified (HCC) 10/17/2013  ? Iron deficiency anemia, unspecified 08/28/2013  ? Atherosclerosis of native arteries of extremity with intermittent claudication (HCC) 12/04/2012  ? ESRD (end stage renal disease) (HCC) 12/04/2012  ? Hypertensive chronic kidney disease with stage 5 chronic kidney disease or end stage renal disease (HCC) 11/20/2012  ? Secondary hyperparathyroidism of renal origin (HCC) 11/20/2012  ? HEPATITIS C 09/07/2006  ? HYPERCHOLESTEROLEMIA 09/07/2006  ? HYPERTENSION, BENIGN SYSTEMIC 09/07/2006  ? ?PCP:  [name], FNP ?Pharmacy:   ?[facility] ?[address] ?[place] [address] ?Phone: [phone] Fax: [phone] ? ?[id number] - [place], [place] - [address] AT  [address] ?[address] ?[place] [address] ?Phone: [phone] Fax: [phone] ? ? ? ? ?Social Determinants of Health (SDOH) Interventions ?  ? ?Readmission Risk Interventions ? ?  06/22/2020  ?  3:40 PM  ?Readmission Risk Prevention Plan  ?Transportation Screening Complete  ?Medication Review Oceanographer) Complete  ?PCP or Specialist appointment within 3-5 days of discharge Complete  ?HRI or Home Care Consult Complete  ?SW Recovery Care/Counseling Consult Complete   ?Palliative Care Screening Not Applicable  ?Skilled Nursing Facility Patient Refused  ? ? ? ?

## 2021-11-17 NOTE — Progress Notes (Signed)
?PROGRESS NOTE ? ? ? ?Raymond [name]  OEV:[id number] DOB: [birth year] DOA: 11/11/2021 ?PCP: [name], FNP  ?66 yo male with the past medical history of ESRD on HD, T/TH/Sat heart failure, COPD, paroxysmal atrial fibrillation who presented with dyspnea and chest pain. Apparently patient has missed 2 weeks of hemodialysis. Continue use crack cocaine. On the day of hospitalization he suffered acute chest pain that prompted him to go to urgent care.  In the ED he was volume overloaded, BUN was 178, creatinine was 28, potassium was 7.4, high-sensitivity troponin was 51, chest x-ray noted bilateral pulmonary vascular congestion ? ? ?Subjective: ?-Feels okay, no complaints ? ?Assessment and Plan: ? ?Pulmonary edema, hyperkalemia ?ESRD (end stage renal disease) on dialysis [facility]) ?-Missed HD X 2 weeks  ?-Homeless TOC following, plan for SNF  ?-Now euvolemic, continue HD per renal ? ?Acute on chronic diastolic CHF (congestive heart failure) ([place]) ?-Echocardiogram from 2022 with preserved LV systolic function, moderate TR ?-volume managed with hemodialysis ? ?Acute metabolic encephalopathy ?[place] encephalopathy. ?-Resolved with HD ? ?COPD (chronic obstructive pulmonary disease) ([place]) ?Stable, no clinical signs of exacerbation ? ?Paroxysmal atrial fibrillation (HCC) ?Continue with amiodarone ?Patient not on anticoagulation due to history of severe GI bleed.   ? ?Anemia of chronic disease ?-Transfused 2 units of PRBC this admission, continue weekly EPO ? ?Hypertension ?Continue blood pressure control with amlodipine.  ? ?Dyslipidemia, continue with rosuvastatin,.  ? ?Substance abuse ([place]) ?-Counseled ? ?DVT prophylaxis: Lovenox ?Code Status: Full code ?Family Communication: Discussed patient in detail, no family at bedside ?Disposition Plan: SNF ? ?Consultants:  ?Nephrology ? ?Procedures:  ? ?Antimicrobials:  ? ? ?Objective: ?Vitals:  ? 11/16/21 1748 11/16/21 2118 11/17/21 0541 11/17/21 0834  ?BP: 121/75 112/65 130/74  109/66  ?Pulse: 67 68 66 71  ?Resp: '16 16 17 16  '$ ?Temp: 98 ?F (36.7 ?C) 97.7 ?F (36.5 ?C) 98.3 ?F (36.8 ?C) 97.8 ?F (36.6 ?C)  ?TempSrc: Oral Oral Oral Oral  ?SpO2: 99% 99% 97% 96%  ?Weight:      ?Height:      ? ? ?Intake/Output Summary (Last 24 hours) at 11/17/2021 1335 ?Last data filed at 11/17/2021 1200 ?Gross per 24 hour  ?Intake 800 ml  ?Output 0 ml  ?Net 800 ml  ? ?Filed Weights  ? 11/13/21 1125 11/16/21 0657 11/16/21 1108  ?Weight: 65.8 kg 68.3 kg 66.8 kg  ? ? ?Examination: ? ?General exam: Appears calm and comfortable  ?Respiratory system: Clear to auscultation ?Cardiovascular system: S1 & S2 heard, RRR.  ?Abd: nondistended, soft and nontender.Normal bowel sounds heard. ?Central nervous system: Alert and oriented. No focal neurological deficits. ?Extremities: no edema, left arm AV fistula ?Skin: No rashes ?Psychiatry:  Mood & affect appropriate.  ? ? ? ?Data Reviewed:  ? ?CBC: ?Recent Labs  ?Lab 11/11/21 ?1034 11/15/21 ?0456 11/16/21 ?0725 11/17/21 ?0350  ?WBC 3.7* 2.5* 3.5* 3.9*  ?NEUTROABS 2.3 1.4*  --   --   ?HGB 8.1* 6.7* 6.9* 9.0*  ?HCT 26.2* 21.2* 21.2* 27.6*  ?MCV 99.6 98.6 96.4 94.2  ?PLT 158 138* 140* 179  ? ?Basic Metabolic Panel: ?Recent Labs  ?Lab 11/11/21 ?1034 11/11/21 ?2021 11/12/21 ?8299 11/13/21 ?3716 11/15/21 ?9678 11/16/21 ?0725 11/17/21 ?0350  ?NA 143  --  137 139 134* 135 135  ?K 7.4*  --  4.9 4.4 4.5 4.6 4.1  ?CL 99  --  98 96* 100 97* 98  ?CO2 20*  --  '26 29 25 26 30  '$ ?GLUCOSE 90  --  70 108* 101* 100* 89  ?BUN 178*  --  53* 73* 42* 54* 19  ?CREATININE 28.39*  --  12.41* 14.41* 10.72* 13.14* 6.41*  ?CALCIUM 9.6  --  9.6 9.1 8.1* 8.0* 8.3*  ?MG 2.2  --   --   --   --   --   --   ?PHOS  --  5.4*  --   --  4.2 4.5  --   ? ?GFR: ?Estimated Creatinine Clearance: 10.4 mL/min (A) (by C-G formula based on SCr of 6.41 mg/dL (H)). ?Liver Function Tests: ?Recent Labs  ?Lab 11/11/21 ?1034 11/15/21 ?0456 11/16/21 ?0725  ?AST <5*  --   --   ?ALT 7  --   --   ?ALKPHOS 47  --   --   ?BILITOT 0.4  --    --   ?PROT 7.0  --   --   ?ALBUMIN 2.8* 2.3* 2.2*  ? ?No results for input(s): LIPASE, AMYLASE in the last 168 hours. ?No results for input(s): AMMONIA in the last 168 hours. ?Coagulation Profile: ?No results for input(s): INR, PROTIME in the last 168 hours. ?Cardiac Enzymes: ?No results for input(s): CKTOTAL, CKMB, CKMBINDEX, TROPONINI in the last 168 hours. ?BNP (last 3 results) ?No results for input(s): PROBNP in the last 8760 hours. ?HbA1C: ?No results for input(s): HGBA1C in the last 72 hours. ?CBG: ?Recent Labs  ?Lab 11/11/21 ?1152 11/11/21 ?1231 11/11/21 ?1806 11/11/21 ?1900 11/11/21 ?1910  ?GLUCAP 64* 48* 45* 73 88  ? ?Lipid Profile: ?No results for input(s): CHOL, HDL, LDLCALC, TRIG, CHOLHDL, LDLDIRECT in the last 72 hours. ?Thyroid Function Tests: ?No results for input(s): TSH, T4TOTAL, FREET4, T3FREE, THYROIDAB in the last 72 hours. ?Anemia Panel: ?No results for input(s): VITAMINB12, FOLATE, FERRITIN, TIBC, IRON, RETICCTPCT in the last 72 hours. ?Urine analysis: ?   ?Component Value Date/Time  ? [place] YELLOW 05/08/2010 1335  ? APPEARANCEUR CLEAR 05/08/2010 1335  ? LABSPEC 1.009 05/08/2010 1335  ? PHURINE 6.0 05/08/2010 1335  ? GLUCOSEU NEGATIVE 05/08/2010 1335  ? [place] NEGATIVE 05/08/2010 1335  ? [place] NEGATIVE 05/08/2010 1335  ? [place] NEGATIVE 05/08/2010 1335  ? PROTEINUR 100 (A) 05/08/2010 1335  ? UROBILINOGEN 0.2 05/08/2010 1335  ? NITRITE NEGATIVE 05/08/2010 1335  ? LEUKOCYTESUR SMALL (A) 05/08/2010 1335  ? ?Sepsis Labs: ?'@LABRCNTIP'$ (procalcitonin:4,lacticidven:4) ? ?) ?Recent Results (from the past 240 hour(s))  ?Culture, blood (Routine X 2) w Reflex to ID Panel     Status: None (Preliminary result)  ? Collection Time: 11/14/21  4:16 PM  ? Specimen: BLOOD RIGHT HAND  ?Result Value Ref Range Status  ? Specimen Description BLOOD RIGHT HAND  Final  ? Special Requests   Final  ?  BOTTLES DRAWN AEROBIC ONLY Blood Culture results may not be optimal due to an inadequate volume of blood  received in culture bottles  ? Culture   Final  ?  NO GROWTH 3 DAYS ?Performed at [facility], [place] [address], [place], [place] [address] ?  ? Report Status PENDING  Incomplete  ?Culture, blood (Routine X 2) w Reflex to ID Panel     Status: None (Preliminary result)  ? Collection Time: 11/14/21  4:16 PM  ? Specimen: BLOOD RIGHT WRIST  ?Result Value Ref Range Status  ? Specimen Description BLOOD RIGHT WRIST  Final  ? Special Requests   Final  ?  BOTTLES DRAWN AEROBIC AND ANAEROBIC Blood Culture results may not be optimal due to an inadequate volume of blood received in  culture bottles  ? Culture   Final  ?  NO GROWTH 3 DAYS ?Performed at [facility], [place] [address], [place], [place] [address] ?  ? Report Status PENDING  Incomplete  ?  ? ?Radiology Studies: ?No results found. ? ? ?Scheduled Meds: ? sodium chloride   Intravenous Once  ? sodium chloride   Intravenous Once  ? sodium chloride   Intravenous Once  ? amiodarone  200 mg Oral Daily  ? amLODipine  5 mg Oral QHS  ? aspirin EC  81 mg Oral Daily  ? calcitRIOL  0.5 mcg Oral Q T,Th,Sa-HD  ? carvedilol  12.5 mg Oral BID  ? Chlorhexidine Gluconate Cloth  6 each Topical Q0600  ? cinacalcet  180 mg Oral Q T,Th,Sa-HD  ? [START ON 11/18/2021] darbepoetin (ARANESP) injection - DIALYSIS  200 mcg Intravenous Q Thu-HD  ? feeding supplement (NEPRO CARB STEADY)  237 mL Oral BID BM  ? fluticasone furoate-vilanterol  1 puff Inhalation Daily  ? folic acid  1 mg Oral Daily  ? heparin  5,000 Units Subcutaneous Q12H  ? multivitamin  1 tablet Oral QHS  ? pantoprazole  40 mg Oral BID AC  ? rosuvastatin  10 mg Oral Daily  ? sevelamer carbonate  4,000 mg Oral TID WC  ? sodium chloride flush  3 mL Intravenous Q12H  ? thiamine  100 mg Oral Daily  ? umeclidinium bromide  1 puff Inhalation Daily  ? ?Continuous Infusions: ? sodium chloride    ? ? ? LOS: 6 days  ? ? ?Time spent: 49mn ? ?PDomenic Polite MD ?[facility] ? ? ?11/17/2021, 1:35 PM  ?  ?

## 2021-11-17 NOTE — [place] FL2 (Signed)
?[place] MEDICAID FL2 LEVEL OF CARE SCREENING TOOL  ?  ? ?IDENTIFICATION  ?Patient Name: ?Raymond [name] Birthdate: [birth year] Sex: male Admission Date (Current Location): ?11/11/2021  ?[place] and [place] Number: ? [place] ?[id number] P Facility and Address:  ?[facility], [place] [address], [place], [place] [address] ?     Provider Number: ?[id number]  ?Attending Physician Name and Address:  ?Domenic Polite, MD ? Relative Name and Phone Number:  ?Tashan, Kreitzer (Sister)   [phone] ?   ?Current Level of Care: ?Hospital Recommended Level of Care: ?[facility] Prior Approval Number: ?  ? ?Date Approved/Denied: ?  PASRR Number: ?[id number] ? ?Discharge Plan: ?SNF ?  ? ?Current Diagnoses: ?Patient Active Problem List  ? Diagnosis Date Noted  ? Hypertension   ? Paroxysmal atrial fibrillation (HCC)   ? Substance abuse ([place])   ? Thrombocytopenia ([place]) 09/15/2021  ? Hypertensive urgency 09/13/2021  ? Uncontrolled hypertension   ? Uremia   ? Severe episode of recurrent major depressive disorder, without psychotic features ([place])   ? ESRD (end stage renal disease) on dialysis ([place]) 05/18/2021  ? Cardiac arrest ([place]) 04/03/2021  ? Anisocoria 04/03/2021  ? Acute on chronic diastolic CHF (congestive heart failure) ([place]) 04/03/2021  ? Hypothermia not due to cold exposure 04/03/2021  ? Junctional bradycardia 04/03/2021  ? Pancytopenia ([place]) 01/25/2021  ? AMS (altered mental status) 01/19/2021  ? Chronic viral hepatitis C ([place]) 12/25/2020  ? 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 Acute upper GI bleed 07/29/2020  ? Acute metabolic encephalopathy 40/34/7425  ? COVID-19 virus infection 07/29/2020  ? AVM (arteriovenous malformation) of stomach, acquired with hemorrhage   ? AVM (arteriovenous malformation) of small bowel, acquired with hemorrhage   ? Current use of long term anticoagulation   ? Sensorineural hearing loss (SNHL) of both ears 03/30/2020  ? History of  arteriovenous malformation (AVM) 01/14/2020  ? UGIB (upper gastrointestinal bleed) 01/14/2020  ? Acute blood loss anemia 01/14/2020  ? Elevated troponin 01/14/2020  ? Allergy, unspecified, initial encounter 04/29/2019  ? Gastroenteritis 12/24/2018  ? Substance use disorder 12/24/2018  ? Type 2 diabetes mellitus with other diabetic kidney complication ([place]) 95/63/8756  ? Noncompliance of patient with renal dialysis 09/12/2018  ? Benign neoplasm of colon   ? Melena   ? Anemia   ? H/O medication noncompliance   ? Polysubstance (excluding opioids) dependence ([place])   ? Atrial flutter with rapid ventricular response ([place]) 08/10/2018  ? Chest pain 08/10/2018  ? PAF (paroxysmal atrial fibrillation) ([place]) 08/01/2018  ? Acute respiratory failure with hypoxia ([place]) 07/13/2018  ? Non-compliance with renal dialysis 07/13/2018  ? PAD (peripheral artery disease) ([place]) 04/11/2018  ? Sepsis ([place]) 03/31/2018  ? Preop cardiovascular exam 03/29/2018  ? Idiopathic gout, unspecified site 12/13/2017  ? Other specified complication of vascular prosthetic devices, implants and grafts, initial encounter ([place]) 11/03/2017  ? Volume overload 08/07/2017  ? Dyspnea 06/20/2017  ? Acute bronchitis   ? COPD (chronic obstructive pulmonary disease) ([place])   ? ESRD on hemodialysis ([place])   ? Hypoxia 12/05/2016  ? Hyperkalemia 12/19/2015  ? Hypoglycemia 12/19/2015  ? Polysubstance abuse ([place]) 12/19/2015  ? Homeless 12/19/2015  ? Anemia associated with stage 5 chronic renal failure ([place]) 12/19/2015  ? BPH without urinary obstruction 10/29/2015  ? Erectile dysfunction 10/29/2015  ? Hematuria, gross 10/29/2015  ? Hypercalcemia 11/07/2014  ? Pruritus, unspecified 10/17/2014  ? Coagulation defect, unspecified ([place]) 09/17/2014  ? Thrombocytopenia, unspecified ([place]) 10/17/2013  ?  Iron deficiency anemia, unspecified 08/28/2013  ? Atherosclerosis of native arteries of extremity with intermittent claudication ([place]) 12/04/2012  ? ESRD (end stage renal disease) ([place])  12/04/2012  ? Hypertensive chronic kidney disease with stage 5 chronic kidney disease or end stage renal disease ([place]) 11/20/2012  ? Secondary hyperparathyroidism of renal origin ([place]) 11/20/2012  ? HEPATITIS C 09/07/2006  ? HYPERCHOLESTEROLEMIA 09/07/2006  ? HYPERTENSION, BENIGN SYSTEMIC 09/07/2006  ? ? ?Orientation RESPIRATION BLADDER Height & Weight   ?  ?Self, Time, Situation, Place ? Normal Continent Weight: 147 lb 4.3 oz (66.8 kg) ?Height:  '5\' 8"'$  (172.7 cm)  ?BEHAVIORAL SYMPTOMS/MOOD NEUROLOGICAL BOWEL NUTRITION STATUS  ?    Continent Diet (see d/c summary)  ?AMBULATORY STATUS COMMUNICATION OF NEEDS Skin   ?Limited Assist Verbally Normal ?  ?  ?  ?    ?     ?     ? ? ?Personal Care Assistance Level of Assistance  ?Bathing, Feeding, Dressing Bathing Assistance: Limited assistance ?Feeding assistance: Independent ?Dressing Assistance: Limited assistance ?   ? ?Functional Limitations Info  ?Sight, Hearing, Speech Sight Info: Impaired ?Hearing Info: Adequate ?Speech Info: Adequate  ? ? ?SPECIAL CARE FACTORS FREQUENCY  ?PT (By licensed PT), OT (By licensed OT)   ?  ?PT Frequency: 5x/ week ?OT Frequency: 5x/ week ?  ?  ?  ?   ? ? ?Contractures Contractures Info: Not present  ? ? ?Additional Factors Info  ?Code Status, Allergies Code Status Info: Full ?Allergies Info: NKA ?  ?  ?  ?   ? ?Current Medications (11/17/2021):  This is the current hospital active medication list ?Current Facility-Administered Medications  ?Medication Dose Route Frequency Provider Last Rate Last Admin  ? 0.9 %  sodium chloride infusion (Manually program via Guardrails IV Fluids)   Intravenous Once Shalhoub, Sherryll Burger, MD      ? 0.9 %  sodium chloride infusion (Manually program via Guardrails IV Fluids)   Intravenous Once Shalhoub, Sherryll Burger, MD      ? 0.9 %  sodium chloride infusion (Manually program via Guardrails IV Fluids)   Intravenous Once Arrien, Jimmy Picket, MD      ? 0.9 %  sodium chloride infusion  250 mL Intravenous PRN Wynetta Fines T, MD      ? acetaminophen (TYLENOL) tablet 650 mg  650 mg Oral Q4H PRN Wynetta Fines T, MD   650 mg at 11/14/21 1634  ? amiodarone (PACERONE) tablet 200 mg  200 mg Oral Daily Wynetta Fines T, MD   200 mg at 11/17/21 1610  ? amLODipine (NORVASC) tablet 5 mg  5 mg Oral QHS Wynetta Fines T, MD   5 mg at 11/16/21 2132  ? aspirin EC tablet 81 mg  81 mg Oral Daily Lequita Halt, MD   81 mg at 11/17/21 9604  ? calcitRIOL (ROCALTROL) capsule 0.5 mcg  0.5 mcg Oral Q T,Th,Sa-HD Roney Jaffe, MD   0.5 mcg at 11/16/21 0851  ? carvedilol (COREG) tablet 12.5 mg  12.5 mg Oral BID Wynetta Fines T, MD   12.5 mg at 11/17/21 5409  ? Chlorhexidine Gluconate Cloth 2 % PADS 6 each  6 each Topical Q0600 Janalee Dane, PA-C   6 each at 11/15/21 1739  ? cinacalcet (SENSIPAR) tablet 180 mg  180 mg Oral Q T,Th,Sa-HD Lequita Halt, MD   180 mg at 11/16/21 0851  ? [START ON 11/18/2021] Darbepoetin Alfa (ARANESP) injection 200 mcg  200 mcg Intravenous Q Thu-HD Schertz,  Herbie Baltimore, MD      ? feeding supplement (NEPRO CARB STEADY) liquid 237 mL  237 mL Oral BID BM Arrien, Jimmy Picket, MD   237 mL at 11/16/21 1400  ? fluticasone furoate-vilanterol (BREO ELLIPTA) 200-25 MCG/ACT 1 puff  1 puff Inhalation Daily Lequita Halt, MD   1 puff at 11/17/21 0734  ? folic acid (FOLVITE) tablet 1 mg  1 mg Oral Daily Wynetta Fines T, MD   1 mg at 11/17/21 2637  ? heparin injection 5,000 Units  5,000 Units Subcutaneous Q12H Lequita Halt, MD   5,000 Units at 11/17/21 8588  ? melatonin tablet 10 mg  10 mg Oral QHS PRN Vernelle Emerald, MD   10 mg at 11/15/21 2120  ? multivitamin (RENA-VIT) tablet 1 tablet  1 tablet Oral QHS Tawni Millers, MD   1 tablet at 11/16/21 2132  ? ondansetron (ZOFRAN) injection 4 mg  4 mg Intravenous Q6H PRN Wynetta Fines T, MD   4 mg at 11/13/21 1752  ? pantoprazole (PROTONIX) EC tablet 40 mg  40 mg Oral BID AC Lequita Halt, MD   40 mg at 11/17/21 0910  ? polyethylene glycol (MIRALAX / GLYCOLAX) packet 17 g  17 g Oral Daily  PRN Wynetta Fines T, MD      ? rosuvastatin (CRESTOR) tablet 10 mg  10 mg Oral Daily Wynetta Fines T, MD   10 mg at 11/17/21 0910  ? sevelamer carbonate (RENVELA) tablet 4,000 mg  4,000 mg Oral TID WC Zhang,

## 2021-11-17 NOTE — Evaluation (Signed)
Physical Therapy Evaluation ?Patient Details ?Name: Raymond [name] ?MRN: [id number] ?DOB: 01-03-1956 ?Today's Date: 11/17/2021 ? ?History of Present Illness ? 66 yo male with the past medical history of ESRD on HD, T/TH/Sat heart failure, COPD, paroxysmal atrial fibrillation who presented with dyspnea and chest pain. Apparently patient has missed 2 weeks of hemodialysis. Patient underwent emergent hemodialysis with ultrafiltration 3,5 L with improvement in volume status but not back to baseline; working diagnosis of volume overload in the setting of ESRD.  ?Clinical Impression ?  ?Pt admitted with above diagnosis. Has had a rehab stay within the past year with good functional outcomes; since then, experiencing home insecurity, leading to difficulty accessing his HD; Has walked with assistive device, and describes independence with mobility and self-care; He also conveys remorse at some choices he has made, and expresses motivation to improve his life overall; Presents to PT with generalized weakness, LLE pain effecting mobility and activity tolerance; Currently needing assit for gait and transfers, and education and reinforcement of functional mobility and health literacy; I anticipate good progress at SNF for post-acute rehab;  Pt currently with functional limitations due to the deficits listed below (see PT Problem List). Pt will benefit from skilled PT to increase their independence and safety with mobility to allow discharge to the venue listed below.   ?   ?   ? ?Recommendations for follow up therapy are one component of a multi-disciplinary discharge planning process, led by the attending physician.  Recommendations may be updated based on patient status, additional functional criteria and insurance authorization. ? ?Follow Up Recommendations Skilled nursing-short term rehab (<3 hours/day) ? ?  ?Assistance Recommended at Discharge Intermittent Supervision/Assistance  ?Patient can return home with the following ?  Help with stairs or ramp for entrance;Assist for transportation;A little help with walking and/or transfers ? ?  ?Equipment Recommendations Other (comment) (Will consider rolator)  ?Recommendations for Other Services ? OT consult (as ordered)  ?  ?Functional Status Assessment Patient has had a recent decline in their functional status and demonstrates the ability to make significant improvements in function in a reasonable and predictable amount of time.  ? ?  ?Precautions / Restrictions Precautions ?Precautions: Fall  ? ?  ? ?Mobility ? Bed Mobility ?Overal bed mobility: Needs Assistance ?Bed Mobility: Supine to Sit ?  ?  ?Supine to sit: Supervision ?  ?  ?General bed mobility comments: for safety ?  ? ?Transfers ?Overall transfer level: Needs assistance ?Equipment used: Rolling walker (2 wheels) ?Transfers: Sit to/from Stand ?Sit to Stand: Min assist ?  ?  ?  ?  ?  ?General transfer comment: Cues for hand placment, safety, and to control descent to sit; Min assist to stady rW ?  ? ?Ambulation/Gait ?Ambulation/Gait assistance: Min guard, Min assist ?Gait Distance (Feet): 30 Feet (x2) ?Assistive device: Rolling walker (2 wheels) ?Gait Pattern/deviations: Step-to pattern, Step-through pattern ?Gait velocity: slowed; concern for community access at this time ?  ?  ?General Gait Details: Step-to pattern progressing to slight step-through; Cues to push down through RW with UEs to unweight painful LLE in stance; cues to self-monitor for activity tolerance; one standing rest break ? ?Stairs ?  ?  ?  ?  ?  ? ?Wheelchair Mobility ?  ? ?Modified Rankin (Stroke Patients Only) ?  ? ?  ? ?Balance Overall balance assessment: Needs assistance ?  ?Sitting balance-Leahy Scale: Fair ?  ?  ?  ?Standing balance-Leahy Scale: Poor (approaching fair) ?  ?  ?  ?  ?  ?  ?  ?  ?  ?  ?  ?  ?   ? ? ? ?  Pertinent Vitals/Pain Pain Assessment ?Pain Assessment: 0-10 ?Pain Score: 7  ?Pain Location: L LE; Hip/upper thigh, increases with wiehgt  bearing ?Pain Descriptors / Indicators: Aching, Grimacing, Guarding ?Pain Intervention(s): Monitored during session, Other (comment) (Adjusted RW for better fit to unweigh LLE in stance)  ? ? ?Home Living Family/patient expects to be discharged to:: Skilled nursing facility ?  ?  ?  ?  ?  ?  ?  ?  ?  ?Additional Comments: Noted pt previously at [facility] 11/22 (unsure how long pt was here). After this, pt was staying at niece's home but unable to stay there long, ended up at friend's houses and most recently at a hotel; Difficulty getting to HD  ?  ?Prior Function Prior Level of Function : Independent/Modified Independent ?  ?  ?  ?  ?  ?  ?Mobility Comments: use of RW for mobility, denies any recent falls ?ADLs Comments: Reports Mod I for ADLs (uses shower chair for showering tasks). Has assist with getting groceries, uses [employer] for HD transport though difficulty d/t no current stable address for pickup ?  ? ? ?Hand Dominance  ? Dominant Hand: Right ? ?  ?Extremity/Trunk Assessment  ? Upper Extremity Assessment ?Upper Extremity Assessment: Defer to OT evaluation ?  ? ?Lower Extremity Assessment ?Lower Extremity Assessment: Generalized weakness;LLE deficits/detail ?LLE Deficits / Details: Hip ROM grossly WFL in open chain movement; Grimace and pain with extending hip and knee with weight bearing ?  ? ?   ?Communication  ? Communication: No difficulties;HOH  ?Cognition Arousal/Alertness: Awake/alert ?Behavior During Therapy: [facility] for tasks assessed/performed ?Overall Cognitive Status: Within Functional Limits for tasks assessed (for simple mobility tasks) ?  ?  ?  ?  ?  ?  ?  ?  ?  ?  ?  ?  ?  ?  ?  ?  ?  ?  ?  ? ?  ?General Comments General comments (skin integrity, edema, etc.): Pt reports he is very motvated to improve his health and better his life; he shows good effort to increase his health literacy; I anticipate he will need continuing education and reinforcement ? ?  ?Exercises     ? ?Assessment/Plan  ?  ?PT Assessment Patient needs continued PT services  ?PT Problem List Decreased strength;Decreased activity tolerance;Decreased balance;Decreased mobility;Decreased coordination;Decreased cognition;Decreased knowledge of use of DME;Decreased safety awareness;Decreased knowledge of precautions;Pain ? ?   ?  ?PT Treatment Interventions DME instruction;Gait training;Stair training;Functional mobility training;Therapeutic activities;Therapeutic exercise;Balance training;Patient/family education;Cognitive remediation   ? ?PT Goals (Current goals can be found in the Care Plan section)  ?Acute Rehab PT Goals ?Patient Stated Goal: Reprots he wants to improve his health and his life ?PT Goal Formulation: With patient ?Time For Goal Achievement: 12/01/21 ?Potential to Achieve Goals: Good ? ?  ?Frequency Min 2X/week ?  ? ? ?Co-evaluation   ?  ?  ?  ?  ? ? ?  ?AM-PAC PT "6 Clicks" Mobility  ?Outcome Measure Help needed turning from your back to your side while in a flat bed without using bedrails?: None ?Help needed moving from lying on your back to sitting on the side of a flat bed without using bedrails?: A Little ?Help needed moving to and from a bed to a chair (including a wheelchair)?: A Little ?Help needed standing up from a chair using your arms (e.g., wheelchair or bedside chair)?: A Little ?Help needed to walk in hospital room?: A Little ?Help needed climbing 3-5  steps with a railing? : A Lot ?6 Click Score: 18 ? ?  ?End of Session Equipment Utilized During Treatment: Gait belt ?Activity Tolerance: Patient tolerated treatment well (Despite painful LLE) ?Patient left: in chair;with call bell/phone within reach;with chair alarm set ?Nurse Communication: Mobility status ?PT Visit Diagnosis: Unsteadiness on feet (R26.81);Other abnormalities of gait and mobility (R26.89);Pain;Muscle weakness (generalized) (M62.81) ?Pain - Right/Left: Left ?Pain - part of body: Leg ?  ? ?Time: 6812-7517 ?PT Time  Calculation (min) (ACUTE ONLY): 20 min ? ? ?Charges:   PT Evaluation ?$PT Eval Low Complexity: 1 Low ?  ?  ?   ? ? ?Roney Marion, PT  ?Acute Rehabilitation Services ?Office [phone] ? ? ?[place] ?11/17/2021, 10:11 AM ?

## 2021-11-17 NOTE — Evaluation (Signed)
Occupational Therapy Evaluation ?Patient Details ?Name: Raymond [name] ?MRN: [id number] ?DOB: [birth year] ?Today's Date: 11/17/2021 ? ? ?History of Present Illness 66 yo male with the past medical history of ESRD on HD, T/TH/Sat heart failure, COPD, paroxysmal atrial fibrillation who presented with dyspnea and chest pain. Apparently patient has missed 2 weeks of hemodialysis. Patient underwent emergent hemodialysis with ultrafiltration 3,5 L with improvement in volume status but not back to baseline; working diagnosis of volume overload in the setting of ESRD.  ? ?Clinical Impression ?  ?Pt admitted with above and presents to OT with impairments impacting pt ability to engage in self-care tasks and functional mobility at [facility].  Pt has had a rehab stay within the past year with good functional outcomes; since then, pt has been experiencing home insecurity which has lead to difficulty accessing HD. PTA pt utilizing RW for mobility and reports independence with self-care tasks (has used shower chair at previous residence).  Pt currently requires min assist for functional mobility with RW and supervision with standing tasks.  Pt limited by pain in LLE in WB.  Pt would benefit from continued OT services acutely to address functional mobility and ADLs while providing education, resources, and support in pt goal to improve his life circumstances in preparation for d/c to venue listed below.   ?   ? ?Recommendations for follow up therapy are one component of a multi-disciplinary discharge planning process, led by the attending physician.  Recommendations may be updated based on patient status, additional functional criteria and insurance authorization.  ? ?Follow Up Recommendations ? Skilled nursing-short term rehab (<3 hours/day)  ?  ?Assistance Recommended at Discharge Set up Supervision/Assistance  ?Patient can return home with the following A little help with walking and/or transfers;A little help with  bathing/dressing/bathroom;Assistance with cooking/housework;Assist for transportation;Help with stairs or ramp for entrance ? ?  ?Functional Status Assessment ? Patient has had a recent decline in their functional status and demonstrates the ability to make significant improvements in function in a reasonable and predictable amount of time.  ?Equipment Recommendations ? None recommended by OT  ?  ?Recommendations for Other Services   ? ? ?  ?Precautions / Restrictions Precautions ?Precautions: Fall ?Restrictions ?Weight Bearing Restrictions: No  ? ?  ? ?Mobility Bed Mobility ?Overal bed mobility: Needs Assistance ?Bed Mobility: Supine to Sit, Sit to Supine ?  ?  ?Supine to sit: Supervision ?  ?  ?General bed mobility comments: for safety ?  ? ?Transfers ?Overall transfer level: Needs assistance ?Equipment used: Rolling walker (2 wheels) ?Transfers: Sit to/from Stand ?Sit to Stand: Min assist ?  ?  ?  ?  ?  ?General transfer comment: Cues for hand placment, safety, and to control descent to sit; Min assist to steady RW ?  ? ?  ?Balance Overall balance assessment: Needs assistance ?  ?Sitting balance-Leahy Scale: Fair ?  ?  ?  ?Standing balance-Leahy Scale: Poor (approaching fair) ?  ?  ?  ?  ?  ?  ?  ?  ?  ?  ?  ?  ?   ? ?ADL either performed or assessed with clinical judgement  ? ?ADL Overall ADL's : Needs assistance/impaired ?  ?  ?Grooming: Supervision/safety;Standing ?  ?  ?  ?  ?  ?Upper Body Dressing : Set up ?  ?Lower Body Dressing: Min guard;Sit to/from stand ?  ?Toilet Transfer: Minimal assistance;Rolling walker (2 wheels);Ambulation ?  ?Toileting- Clothing Manipulation and Hygiene: Minimal assistance ?  ?  ?  ?  Functional mobility during ADLs: Minimal assistance;Rolling walker (2 wheels) ?General ADL Comments: Min A with RW for mobility in room.  Pt with increased pain in LLE with WB and ambulation, benefiting from use of RW to offset and reduce pain  ? ? ? ?Vision Baseline Vision/History: 3 Glaucoma;1 Wears  glasses ?Ability to See in Adequate Light: 1 Impaired ?Vision Assessment?: Vision impaired- to be further tested in functional context  ?   ?   ?   ? ?Pertinent Vitals/Pain Pain Assessment ?Pain Assessment: 0-10 ?Pain Score: 6  ?Pain Location: L LE; Hip/upper thigh, increases with weight bearing ?Pain Descriptors / Indicators: Aching, Grimacing, Guarding ?Pain Intervention(s): Monitored during session, Limited activity within patient's tolerance  ? ? ? ?Hand Dominance Right ?  ?Extremity/Trunk Assessment Upper Extremity Assessment ?Upper Extremity Assessment: Generalized weakness ?  ?Lower Extremity Assessment ?Lower Extremity Assessment: Generalized weakness;LLE deficits/detail ?LLE Deficits / Details: Hip ROM grossly WFL in open chain movement; Grimace and pain with extending hip and knee with weight bearing ?  ?  ?  ?Communication Communication ?Communication: No difficulties;HOH ?  ?Cognition Arousal/Alertness: Awake/alert ?Behavior During Therapy: [facility] for tasks assessed/performed ?Overall Cognitive Status: Within Functional Limits for tasks assessed ?  ?  ?  ?  ?  ?  ?  ?  ?  ?  ?  ?  ?  ?  ?  ?  ?General Comments: oriented to time, place, situation ?  ?  ?General Comments  Pt reports he is very motivated to improve his health and better his life; pt reports understanding that he has made mistakes in his past but hopes to make positive changes.  Pt will benefit from continued education and reinforcement. ? ?  ?   ?   ? ? ?Home Living Family/patient expects to be discharged to:: Skilled nursing facility ?  ?  ?  ?  ?  ?  ?  ?  ?  ?  ?  ?  ?  ?  ?  ?  ?Additional Comments: Noted pt previously at [facility] 11/22 (unsure how long pt was here). After this, pt was staying at niece's home but unable to stay there long, ended up at friend's houses and most recently at a hotel; Difficulty getting to HD ?  ? ?  ?Prior Functioning/Environment Prior Level of Function : Independent/Modified Independent ?  ?  ?  ?   ?  ?  ?Mobility Comments: use of RW for mobility, denies any recent falls ?ADLs Comments: Reports Mod I for ADLs (uses shower chair for showering tasks). Has assist with getting groceries, uses [employer] for HD transport though difficulty d/t no current stable address for pickup ?  ? ?  ?  ?OT Problem List: Decreased strength;Decreased activity tolerance;Impaired balance (sitting and/or standing);Decreased coordination;Pain ?  ?   ?OT Treatment/Interventions: Self-care/ADL training;Therapeutic exercise;Energy conservation;DME and/or AE instruction;Therapeutic activities;Patient/family education;Balance training  ?  ?OT Goals(Current goals can be found in the care plan section) Acute Rehab OT Goals ?Patient Stated Goal: to get stronger ?OT Goal Formulation: With patient ?Time For Goal Achievement: 12/01/21 ?Potential to Achieve Goals: Good  ?OT Frequency: Min 2X/week ?  ? ?   ?AM-PAC OT "6 Clicks" Daily Activity     ?Outcome Measure Help from another person eating meals?: None ?Help from another person taking care of personal grooming?: A Little ?Help from another person toileting, which includes using toliet, bedpan, or urinal?: A Little ?Help from another person bathing (including washing, rinsing,  drying)?: A Little ?Help from another person to put on and taking off regular upper body clothing?: A Little ?Help from another person to put on and taking off regular lower body clothing?: A Little ?6 Click Score: 19 ?  ?End of Session Equipment Utilized During Treatment: Gait belt;Rolling walker (2 wheels) ?Nurse Communication: Mobility status ? ?Activity Tolerance: Patient tolerated treatment well;Patient limited by pain ?Patient left: in bed;with bed alarm set ? ?OT Visit Diagnosis: Unsteadiness on feet (R26.81);Muscle weakness (generalized) (M62.81);Pain ?Pain - Right/Left: Left ?Pain - part of body: Leg  ?              ?Time: 2902-1115 ?OT Time Calculation (min): 21 min ?Charges:  OT General Charges ?$OT Visit: 1  Visit ?OT Evaluation ?$OT Eval Low Complexity: 1 Low ? ?Simonne Come, MS, OTR/L ?Office [phone] ? ?Simonne Come ?11/17/2021, 2:47 PM ?

## 2021-11-18 DIAGNOSIS — Z992 Dependence on renal dialysis: Secondary | ICD-10-CM | POA: Diagnosis not present

## 2021-11-18 DIAGNOSIS — N186 End stage renal disease: Secondary | ICD-10-CM | POA: Diagnosis not present

## 2021-11-18 NOTE — Progress Notes (Signed)
?[facility] ?Progress Note  ? ?Subjective:  Seen in dialysis.  Feels fine.  NO new issues.  ? ?Objective ?Vitals:  ? 11/18/21 0421 11/18/21 0842 11/18/21 0850 11/18/21 0900  ?BP: 125/79 (!) 123/59 120/65 (!) 146/79  ?Pulse: 75 69 66 76  ?Resp: 18 (!) 22 18   ?Temp: 98.3 ?F (36.8 ?C) 98 ?F (36.7 ?C)    ?TempSrc: Oral     ?SpO2: 96%     ?Weight:  66.9 kg    ?Height:      ? ?Physical Exam ?General: Chronically ill appearing, NAD. Room air. ?Heart: RRR; no murmur ?Lungs: CTAB; no wheezing ?Abdomen: soft ?Extremities: No LE edema ?Dialysis Access: LUE AVF + thrill ? ?Additional Objective ?Labs: ?Basic Metabolic Panel: ?Recent Labs  ?Lab 11/11/21 ?2021 11/12/21 ?4481 11/15/21 ?8563 11/16/21 ?0725 11/17/21 ?0350  ?NA  --    < > 134* 135 135  ?K  --    < > 4.5 4.6 4.1  ?CL  --    < > 100 97* 98  ?CO2  --    < > '25 26 30  '$ ?GLUCOSE  --    < > 101* 100* 89  ?BUN  --    < > 42* 54* 19  ?CREATININE  --    < > 10.72* 13.14* 6.41*  ?CALCIUM  --    < > 8.1* 8.0* 8.3*  ?PHOS 5.4*  --  4.2 4.5  --   ? < > = values in this interval not displayed.  ? ? ?Liver Function Tests: ?Recent Labs  ?Lab 11/11/21 ?1034 11/15/21 ?0456 11/16/21 ?0725  ?AST <5*  --   --   ?ALT 7  --   --   ?ALKPHOS 47  --   --   ?BILITOT 0.4  --   --   ?PROT 7.0  --   --   ?ALBUMIN 2.8* 2.3* 2.2*  ? ? ?CBC: ?Recent Labs  ?Lab 11/11/21 ?1034 11/15/21 ?0456 11/16/21 ?0725 11/17/21 ?0350  ?WBC 3.7* 2.5* 3.5* 3.9*  ?NEUTROABS 2.3 1.4*  --   --   ?HGB 8.1* 6.7* 6.9* 9.0*  ?HCT 26.2* 21.2* 21.2* 27.6*  ?MCV 99.6 98.6 96.4 94.2  ?PLT 158 138* 140* 179  ? ? ?Medications: ? sodium chloride    ? ? sodium chloride   Intravenous Once  ? sodium chloride   Intravenous Once  ? sodium chloride   Intravenous Once  ? amiodarone  200 mg Oral Daily  ? amLODipine  5 mg Oral QHS  ? aspirin EC  81 mg Oral Daily  ? calcitRIOL  0.5 mcg Oral Q T,Th,Sa-HD  ? carvedilol  12.5 mg Oral BID  ? Chlorhexidine Gluconate Cloth  6 each Topical Q0600  ? cinacalcet  180 mg Oral Q  T,Th,Sa-HD  ? darbepoetin (ARANESP) injection - DIALYSIS  200 mcg Intravenous Q Thu-HD  ? feeding supplement (NEPRO CARB STEADY)  237 mL Oral BID BM  ? fluticasone furoate-vilanterol  1 puff Inhalation Daily  ? folic acid  1 mg Oral Daily  ? heparin  5,000 Units Subcutaneous Q12H  ? multivitamin  1 tablet Oral QHS  ? pantoprazole  40 mg Oral BID AC  ? rosuvastatin  10 mg Oral Daily  ? sevelamer carbonate  4,000 mg Oral TID WC  ? sodium chloride flush  3 mL Intravenous Q12H  ? thiamine  100 mg Oral Daily  ? umeclidinium bromide  1 puff Inhalation Daily  ? ? ?Dialysis Orders: ?[facility] TTS ?  4h  400/1.5  68kg 2/2 bath  LFA AVF  Hep none  ? - last HD 4/22, post HD 68kg ? - last Hb 8.6 on 4/20 ? - mircera 256mg q2, last 4/13, was due 4/26 ? - venofer 50 q wk ? - calcitriol 0.542m PO q HD ?  ?Assessment/Plan: ?Uremia - w/ azotemia, creat 28. Missed HD x 2 wks. Pt is homeless, see below. ?Homelessness - major issue w/ HD pt,  needs assistance with placement, SW/case manager aware. Housing resources previously provided to patient in outpatient HD center.  ?Fever: On admit, blood cultures NGTD. ?ESRD - on HD TTS. Today on schedule.  ?HTN/volume: BP and edema improved. Back to prior dry weight.  ?Atrial Fib - on coreg, eliquis ?Secondary HPTH: Ca/Phos ok, continue home binders/VDRA.  ?Anemia of ESRD: Hgb improved - 9. S/p 2U PRBCs this admit. Continue Aranesp q Thursday while here. S/p episode epistaxis this admit. ?Dispo- SW working on placement  ?  ?LiJannifer HickD ?[facility] ?Pager [phone] ? ? ? ?

## 2021-11-18 NOTE — Progress Notes (Signed)
?PROGRESS NOTE ? ? ? ?Raymond [name]  TDS:[id number] DOB: 05/24/1956 DOA: 11/11/2021 ?PCP: [name], FNP  ?66 yo homeless male with the past medical history of ESRD on HD, T/TH/Sat heart failure, COPD, paroxysmal atrial fibrillation who presented with dyspnea and chest pain. Apparently patient has missed 2 weeks of hemodialysis. Continue use crack cocaine. On the day of hospitalization he suffered acute chest pain that prompted him to go to urgent care.  In the ED he was volume overloaded, BUN was 178, creatinine was 28, potassium was 7.4, high-sensitivity troponin was 51, chest x-ray noted bilateral pulmonary vascular congestion. ?-Improved after hemodialysis ?-Now awaiting placement ? ? ?Subjective: ?-Feels okay overall, seen on dialysis ? ?Assessment and Plan: ? ?Pulmonary edema, hyperkalemia ?ESRD (end stage renal disease) on dialysis [facility]) ?-Missed HD X 2 weeks  ?-Homeless TOC following, plan for SNF  ?-Now euvolemic, continue HD per renal ? ?Acute on chronic diastolic CHF (congestive heart failure) ([place]) ?-Echocardiogram from 2022 with preserved LV systolic function, moderate TR ?-volume managed with hemodialysis ? ?Acute metabolic encephalopathy ?[place] encephalopathy. ?-Resolved with HD ? ?COPD (chronic obstructive pulmonary disease) ([place]) ?Stable, no clinical signs of exacerbation ? ?Paroxysmal atrial fibrillation (HCC) ?Continue with amiodarone ?Patient not on anticoagulation due to history of severe GI bleed.   ? ?Anemia of chronic disease ?-Transfused 2 units of PRBC this admission, continue weekly EPO ? ?Hypertension ?-Stable, continue amlodipine.  ? ?Dyslipidemia, continue with rosuvastatin,.  ? ?Substance abuse ([place]) ?-Counseled ? ?DVT prophylaxis: Lovenox ?Code Status: Full code ?Family Communication: Discussed patient in detail, no family at bedside ?Disposition Plan: SNF ? ?Consultants:  ?Nephrology ? ?Procedures:  ? ?Antimicrobials:  ? ? ?Objective: ?Vitals:  ? 11/18/21 1100 11/18/21 1130  11/18/21 1200 11/18/21 1340  ?BP: 130/74 125/77 (!) 126/94 (!) 141/79  ?Pulse: 71 70 68 77  ?Resp:    15  ?Temp:    98.3 ?F (36.8 ?C)  ?TempSrc:      ?SpO2:    96%  ?Weight:      ?Height:      ? ? ?Intake/Output Summary (Last 24 hours) at 11/18/2021 1358 ?Last data filed at 11/18/2021 0700 ?Gross per 24 hour  ?Intake 280 ml  ?Output 0 ml  ?Net 280 ml  ? ?Filed Weights  ? 11/17/21 2112 11/18/21 0351 11/18/21 0842  ?Weight: 70.4 kg 70.4 kg 66.9 kg  ? ? ?Examination: ? ?General exam: AAO x2, no distress ?CVS: S1-S2, regular rhythm ?Lungs: Clear bilaterally ?Abdomen: Soft, nontender, bowel sounds present ?Extremities: No edema, left arm AV fistula  ?Skin: No rashes ?Psychiatry:  Mood & affect appropriate.  ? ? ? ?Data Reviewed:  ? ?CBC: ?Recent Labs  ?Lab 11/15/21 ?0456 11/16/21 ?0725 11/17/21 ?0350  ?WBC 2.5* 3.5* 3.9*  ?NEUTROABS 1.4*  --   --   ?HGB 6.7* 6.9* 9.0*  ?HCT 21.2* 21.2* 27.6*  ?MCV 98.6 96.4 94.2  ?PLT 138* 140* 179  ? ?Basic Metabolic Panel: ?Recent Labs  ?Lab 11/11/21 ?2021 11/12/21 ?2620 11/13/21 ?3559 11/15/21 ?7416 11/16/21 ?0725 11/17/21 ?0350  ?NA  --  137 139 134* 135 135  ?K  --  4.9 4.4 4.5 4.6 4.1  ?CL  --  98 96* 100 97* 98  ?CO2  --  '26 29 25 26 30  '$ ?GLUCOSE  --  70 108* 101* 100* 89  ?BUN  --  53* 73* 42* 54* 19  ?CREATININE  --  12.41* 14.41* 10.72* 13.14* 6.41*  ?CALCIUM  --  9.6 9.1 8.1*  8.0* 8.3*  ?PHOS 5.4*  --   --  4.2 4.5  --   ? ?GFR: ?Estimated Creatinine Clearance: 10.4 mL/min (A) (by C-G formula based on SCr of 6.41 mg/dL (H)). ?Liver Function Tests: ?Recent Labs  ?Lab 11/15/21 ?0456 11/16/21 ?0725  ?ALBUMIN 2.3* 2.2*  ? ?No results for input(s): LIPASE, AMYLASE in the last 168 hours. ?No results for input(s): AMMONIA in the last 168 hours. ?Coagulation Profile: ?No results for input(s): INR, PROTIME in the last 168 hours. ?Cardiac Enzymes: ?No results for input(s): CKTOTAL, CKMB, CKMBINDEX, TROPONINI in the last 168 hours. ?BNP (last 3 results) ?No results for input(s): PROBNP in  the last 8760 hours. ?HbA1C: ?No results for input(s): HGBA1C in the last 72 hours. ?CBG: ?Recent Labs  ?Lab 11/11/21 ?1806 11/11/21 ?1900 11/11/21 ?1910  ?GLUCAP 45* 73 88  ? ?Lipid Profile: ?No results for input(s): CHOL, HDL, LDLCALC, TRIG, CHOLHDL, LDLDIRECT in the last 72 hours. ?Thyroid Function Tests: ?No results for input(s): TSH, T4TOTAL, FREET4, T3FREE, THYROIDAB in the last 72 hours. ?Anemia Panel: ?No results for input(s): VITAMINB12, FOLATE, FERRITIN, TIBC, IRON, RETICCTPCT in the last 72 hours. ?Urine analysis: ?   ?Component Value Date/Time  ? [place] YELLOW 05/08/2010 1335  ? APPEARANCEUR CLEAR 05/08/2010 1335  ? LABSPEC 1.009 05/08/2010 1335  ? PHURINE 6.0 05/08/2010 1335  ? GLUCOSEU NEGATIVE 05/08/2010 1335  ? [place] NEGATIVE 05/08/2010 1335  ? [place] NEGATIVE 05/08/2010 1335  ? [place] NEGATIVE 05/08/2010 1335  ? PROTEINUR 100 (A) 05/08/2010 1335  ? UROBILINOGEN 0.2 05/08/2010 1335  ? NITRITE NEGATIVE 05/08/2010 1335  ? LEUKOCYTESUR SMALL (A) 05/08/2010 1335  ? ?Sepsis Labs: ?'@LABRCNTIP'$ (procalcitonin:4,lacticidven:4) ? ?) ?Recent Results (from the past 240 hour(s))  ?Culture, blood (Routine X 2) w Reflex to ID Panel     Status: None (Preliminary result)  ? Collection Time: 11/14/21  4:16 PM  ? Specimen: BLOOD RIGHT HAND  ?Result Value Ref Range Status  ? Specimen Description BLOOD RIGHT HAND  Final  ? Special Requests   Final  ?  BOTTLES DRAWN AEROBIC ONLY Blood Culture results may not be optimal due to an inadequate volume of blood received in culture bottles  ? Culture   Final  ?  NO GROWTH 4 DAYS ?Performed at [facility], [place] [address], [place], [place] [address] ?  ? Report Status PENDING  Incomplete  ?Culture, blood (Routine X 2) w Reflex to ID Panel     Status: None (Preliminary result)  ? Collection Time: 11/14/21  4:16 PM  ? Specimen: BLOOD RIGHT WRIST  ?Result Value Ref Range Status  ? Specimen Description BLOOD RIGHT WRIST  Final  ? Special Requests   Final  ?  BOTTLES  DRAWN AEROBIC AND ANAEROBIC Blood Culture results may not be optimal due to an inadequate volume of blood received in culture bottles  ? Culture   Final  ?  NO GROWTH 4 DAYS ?Performed at [facility], [place] [address], [place], [place] [address] ?  ? Report Status PENDING  Incomplete  ?  ? ?Radiology Studies: ?No results found. ? ? ?Scheduled Meds: ? sodium chloride   Intravenous Once  ? sodium chloride   Intravenous Once  ? sodium chloride   Intravenous Once  ? amiodarone  200 mg Oral Daily  ? amLODipine  5 mg Oral QHS  ? aspirin EC  81 mg Oral Daily  ? calcitRIOL  0.5 mcg Oral Q T,Th,Sa-HD  ? carvedilol  12.5 mg Oral BID  ?  Chlorhexidine Gluconate Cloth  6 each Topical Q0600  ? cinacalcet  180 mg Oral Q T,Th,Sa-HD  ? darbepoetin (ARANESP) injection - DIALYSIS  200 mcg Intravenous Q Thu-HD  ? feeding supplement (NEPRO CARB STEADY)  237 mL Oral BID BM  ? fluticasone furoate-vilanterol  1 puff Inhalation Daily  ? folic acid  1 mg Oral Daily  ? heparin  5,000 Units Subcutaneous Q12H  ? multivitamin  1 tablet Oral QHS  ? pantoprazole  40 mg Oral BID AC  ? rosuvastatin  10 mg Oral Daily  ? sevelamer carbonate  4,000 mg Oral TID WC  ? sodium chloride flush  3 mL Intravenous Q12H  ? thiamine  100 mg Oral Daily  ? umeclidinium bromide  1 puff Inhalation Daily  ? ?Continuous Infusions: ? sodium chloride    ? ? ? LOS: 7 days  ? ? ?Time spent: 63mn ? ?PDomenic Polite MD ?[facility] ? ? ?11/18/2021, 1:58 PM  ?  ?

## 2021-11-18 NOTE — Procedures (Signed)
Arrived to hemodialysis with a BP of 123/59, Fistula easily cannulated with no bleeding nor complications noted.  Pt had stable vital signs throughout treatment time.  Following Hemodialysis BP was 112/77 with a net UF of 2L.  Pt decannulated and pressure held for hemostasis.  Dressings applied to cannulation sites and pt returned to his room. Medication given was Ananesp. ?

## 2021-11-19 LAB — CULTURE, BLOOD (ROUTINE X 2)
Culture: NO GROWTH
Culture: NO GROWTH

## 2021-11-19 LAB — CBC
HCT: 26.7 % — ABNORMAL LOW (ref 39.0–52.0)
Hemoglobin: 8.3 g/dL — ABNORMAL LOW (ref 13.0–17.0)
MCH: 30.4 pg (ref 26.0–34.0)
MCHC: 31.1 g/dL (ref 30.0–36.0)
MCV: 97.8 fL (ref 80.0–100.0)
Platelets: 202 10*3/uL (ref 150–400)
RBC: 2.73 MIL/uL — ABNORMAL LOW (ref 4.22–5.81)
RDW: 16.9 % — ABNORMAL HIGH (ref 11.5–15.5)
WBC: 3.9 10*3/uL — ABNORMAL LOW (ref 4.0–10.5)
nRBC: 0 % (ref 0.0–0.2)

## 2021-11-19 MED ORDER — DOCUSATE SODIUM 100 MG PO CAPS: 100.0000 mg | ORAL_CAPSULE | Freq: Every day | ORAL | Status: DC | PRN

## 2021-11-19 NOTE — TOC Progression Note (Signed)
Transition of Care (TOC) - Initial/Assessment Note  ? ? ?Patient Details  ?Name: Raymond [name] ?MRN: [id number] ?Date of Birth: 1956-02-26 ? ?Transition of Care (TOC) CM/SW Contact:    ?Catalina Pizza Mikhia Dusek, LCSWA ?Phone Number: ?11/19/2021, 1:22 PM ? ?Clinical Narrative:                 ?Patient currently has no bed offers even after CSW expanded the SNF search to other counties.   ? ?At the direction of [facility] leadership, CSW contacted the [employer] (shelter).  CSW left a message for [facility], Interior and spatial designer and is awaiting a returned call.   ? ?TOC will continue to follow.   ? ?Expected Discharge Plan: Skilled Nursing Facility ?Barriers to Discharge: Continued Medical Work up, Homeless with medical needs, Family Issues, No SNF bed, Insurance Authorization ? ? ?Patient Goals and CMS Choice ?Patient states their goals for this hospitalization and ongoing recovery are:: To go to rehab ?CMS Medicare.gov Compare Post Acute Care list provided to:: Patient ?Choice offered to / list presented to : Patient ? ?Expected Discharge Plan and Services ?Expected Discharge Plan: Skilled Nursing Facility ?  ?  ?  ?Living arrangements for the past 2 months: Homeless ?                ?  ?  ?  ?  ?  ?  ?  ?  ?  ?  ? ?Prior Living Arrangements/Services ?Living arrangements for the past 2 months: Homeless ?Lives with:: Self ?Patient language and need for interpreter reviewed:: Yes ?Do you feel safe going back to the place where you live?: No   Homeless  ?Need for Family Participation in Patient Care: Yes (Comment) ?Care giver support system in place?: No (comment) ?  ?Criminal Activity/Legal Involvement Pertinent to Current Situation/Hospitalization: No - Comment as needed ? ?Activities of Daily Living ?Home Assistive Devices/Equipment: None ?ADL Screening (condition at time of admission) ?Patient's cognitive ability adequate to safely complete daily activities?: Yes ?Is the patient deaf or have difficulty hearing?: No ?Does the patient have  difficulty seeing, even when wearing glasses/contacts?: Yes ?Does the patient have difficulty concentrating, remembering, or making decisions?: No ?Patient able to express need for assistance with ADLs?: Yes ?Does the patient have difficulty dressing or bathing?: No ?Independently performs ADLs?: Yes (appropriate for developmental age) ?Does the patient have difficulty walking or climbing stairs?: No ?Weakness of Legs: Both ?Weakness of Arms/Hands: None ? ?Permission Sought/Granted ?Permission sought to share information with : Other (comment) (CSW) ?Permission granted to share information with : Yes, Verbal Permission Granted ?   ? Permission granted to share info w AGENCY: SNFs or shelters ?   ?   ? ?Emotional Assessment ?Appearance:: Appears older than stated age ?Attitude/Demeanor/Rapport: Engaged ?Affect (typically observed): Adaptable ?Orientation: : Oriented to Situation, Oriented to Place, Oriented to Self ?Alcohol / Substance Use: Illicit Drugs ?Psych Involvement: No (comment) ? ?Admission diagnosis:  Hyperkalemia [E87.5] ?CHF (congestive heart failure) (HCC) [I50.9] ?Uremia [N19] ?Patient Active Problem List  ? Diagnosis Date Noted  ? Hypertension   ? Paroxysmal atrial fibrillation (HCC)   ? Substance abuse (HCC)   ? Thrombocytopenia (HCC) 09/15/2021  ? Hypertensive urgency 09/13/2021  ? Uncontrolled hypertension   ? Uremia   ? Severe episode of recurrent major depressive disorder, without psychotic features (HCC)   ? ESRD (end stage renal disease) on dialysis (HCC) 05/18/2021  ? Cardiac arrest (HCC) 04/03/2021  ? Anisocoria 04/03/2021  ? Acute on chronic diastolic CHF (  congestive heart failure) (HCC) 04/03/2021  ? Hypothermia not due to cold exposure 04/03/2021  ? Junctional bradycardia 04/03/2021  ? Pancytopenia (HCC) 01/25/2021  ? AMS (altered mental status) 01/19/2021  ? 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 Acute upper GI  bleed 07/29/2020  ? Acute metabolic encephalopathy 07/29/2020  ? COVID-19 virus infection 07/29/2020  ? AVM (arteriovenous malformation) of stomach, acquired with hemorrhage   ? AVM (arteriovenous malformation) of small bowel, acquired with hemorrhage   ? Current use of long term anticoagulation   ? Sensorineural hearing loss (SNHL) of both ears 03/30/2020  ? History of arteriovenous malformation (AVM) 01/14/2020  ? UGIB (upper gastrointestinal bleed) 01/14/2020  ? Acute blood loss anemia 01/14/2020  ? Elevated troponin 01/14/2020  ? Allergy, unspecified, initial encounter 04/29/2019  ? Gastroenteritis 12/24/2018  ? Substance use disorder 12/24/2018  ? Type 2 diabetes mellitus with other diabetic kidney complication (HCC) 09/24/2018  ? Noncompliance of patient with renal dialysis 09/12/2018  ? Benign neoplasm of colon   ? Melena   ? Anemia   ? H/O medication noncompliance   ? Polysubstance (excluding opioids) dependence (HCC)   ? Atrial flutter with rapid ventricular response (HCC) 08/10/2018  ? Chest pain 08/10/2018  ? PAF (paroxysmal atrial fibrillation) (HCC) 08/01/2018  ? Acute respiratory failure with hypoxia (HCC) 07/13/2018  ? Non-compliance with renal dialysis 07/13/2018  ? PAD (peripheral artery disease) (HCC) 04/11/2018  ? Sepsis (HCC) 03/31/2018  ? Preop cardiovascular exam 03/29/2018  ? Idiopathic gout, unspecified site 12/13/2017  ? Other specified complication of vascular prosthetic devices, implants and grafts, initial encounter (HCC) 11/03/2017  ? Volume overload 08/07/2017  ? Dyspnea 06/20/2017  ? Acute bronchitis   ? COPD (chronic obstructive pulmonary disease) (HCC)   ? ESRD on hemodialysis (HCC)   ? Hypoxia 12/05/2016  ? Hyperkalemia 12/19/2015  ? Hypoglycemia 12/19/2015  ? Polysubstance abuse (HCC) 12/19/2015  ? Homeless 12/19/2015  ? Anemia associated with stage 5 chronic renal failure (HCC) 12/19/2015  ? BPH without urinary obstruction 10/29/2015  ? Erectile dysfunction 10/29/2015  ?  Hematuria, gross 10/29/2015  ? Hypercalcemia 11/07/2014  ? Pruritus, unspecified 10/17/2014  ? Coagulation defect, unspecified (HCC) 09/17/2014  ? Thrombocytopenia, unspecified (HCC) 10/17/2013  ? Iron deficiency anemia, unspecified 08/28/2013  ? Atherosclerosis of native arteries of extremity with intermittent claudication (HCC) 12/04/2012  ? ESRD (end stage renal disease) (HCC) 12/04/2012  ? Hypertensive chronic kidney disease with stage 5 chronic kidney disease or end stage renal disease (HCC) 11/20/2012  ? Secondary hyperparathyroidism of renal origin (HCC) 11/20/2012  ? HEPATITIS C 09/07/2006  ? HYPERCHOLESTEROLEMIA 09/07/2006  ? HYPERTENSION, BENIGN SYSTEMIC 09/07/2006  ? ?PCP:  [name], FNP ?Pharmacy:   ?[facility] ?[address] ?[place] [address] ?Phone: [phone] Fax: [phone] ? ?[id number] - [place], [place] - [address] AT [facility] [address] ?[address] ?[place] [address] ?Phone: [phone] Fax: [phone] ? ? ? ? ?Social Determinants of Health (SDOH) Interventions ?  ? ?Readmission Risk Interventions ? ?  06/22/2020  ?  3:40 PM  ?Readmission Risk Prevention Plan  ?Transportation Screening Complete  ?Medication Review Oceanographer) Complete  ?PCP or Specialist appointment within 3-5 days of discharge Complete  ?HRI or Home Care Consult Complete  ?SW Recovery Care/Counseling Consult Complete  ?Palliative Care Screening Not Applicable  ?Skilled Nursing  Facility Patient Refused  ? ? ? ?

## 2021-11-19 NOTE — Progress Notes (Signed)
?[facility] ?Progress Note  ? ?Subjective:   Seen in room - sitting in recliner. No overnight issues, says HD went fine yesterday. No CP/dyspnea. Reports that his binders make him constipated and uses prn laxative at home. Adding stool softeners prn here for now. ? ?Objective ?Vitals:  ? 11/18/21 1638 11/18/21 2101 11/19/21 0459 11/19/21 0500  ?BP: 119/64 135/71 140/80   ?Pulse: 73 71 69   ?Resp:   18   ?Temp: 98.2 ?F (36.8 ?C) 98.4 ?F (36.9 ?C) 97.6 ?F (36.4 ?C)   ?TempSrc: Oral Oral Oral   ?SpO2: 98% 98% 100%   ?Weight:    64.8 kg  ?Height:      ? ?Physical Exam ?General: Chronically ill appearing, NAD. Room air. ?Heart: RRR; no murmur ?Lungs: CTAB; no wheezing ?Abdomen: soft ?Extremities: No LE edema ?Dialysis Access: LUE AVF + thrill ? ?Additional Objective ?Labs: ?Basic Metabolic Panel: ?Recent Labs  ?Lab 11/15/21 ?0456 11/16/21 ?0725 11/17/21 ?0350  ?NA 134* 135 135  ?K 4.5 4.6 4.1  ?CL 100 97* 98  ?CO2 '25 26 30  '$ ?GLUCOSE 101* 100* 89  ?BUN 42* 54* 19  ?CREATININE 10.72* 13.14* 6.41*  ?CALCIUM 8.1* 8.0* 8.3*  ?PHOS 4.2 4.5  --   ? ?Liver Function Tests: ?Recent Labs  ?Lab 11/15/21 ?0456 11/16/21 ?0725  ?ALBUMIN 2.3* 2.2*  ? ?CBC: ?Recent Labs  ?Lab 11/15/21 ?0456 11/16/21 ?0725 11/17/21 ?0350 11/19/21 ?0102  ?WBC 2.5* 3.5* 3.9* 3.9*  ?NEUTROABS 1.4*  --   --   --   ?HGB 6.7* 6.9* 9.0* 8.3*  ?HCT 21.2* 21.2* 27.6* 26.7*  ?MCV 98.6 96.4 94.2 97.8  ?PLT 138* 140* 179 202  ? ?Medications: ? sodium chloride    ? ? sodium chloride   Intravenous Once  ? sodium chloride   Intravenous Once  ? sodium chloride   Intravenous Once  ? amiodarone  200 mg Oral Daily  ? amLODipine  5 mg Oral QHS  ? aspirin EC  81 mg Oral Daily  ? calcitRIOL  0.5 mcg Oral Q T,Th,Sa-HD  ? carvedilol  12.5 mg Oral BID  ? Chlorhexidine Gluconate Cloth  6 each Topical Q0600  ? cinacalcet  180 mg Oral Q T,Th,Sa-HD  ? darbepoetin (ARANESP) injection - DIALYSIS  200 mcg Intravenous Q Thu-HD  ? feeding supplement (NEPRO CARB STEADY)   237 mL Oral BID BM  ? fluticasone furoate-vilanterol  1 puff Inhalation Daily  ? folic acid  1 mg Oral Daily  ? heparin  5,000 Units Subcutaneous Q12H  ? multivitamin  1 tablet Oral QHS  ? pantoprazole  40 mg Oral BID AC  ? rosuvastatin  10 mg Oral Daily  ? sevelamer carbonate  4,000 mg Oral TID WC  ? sodium chloride flush  3 mL Intravenous Q12H  ? thiamine  100 mg Oral Daily  ? umeclidinium bromide  1 puff Inhalation Daily  ? ? ?Dialysis Orders: ?[facility] TTS ?4h  400/1.5  68kg 2/2 bath  LFA AVF  Hep none  ? - last HD 4/22, post HD 68kg ? - last Hb 8.6 on 4/20 ? - mircera 229mg q2, last 4/13, was due 4/26 ? - venofer 50 q wk ? - calcitriol 0.520m PO q HD ?  ?Assessment/Plan: ?Uremia - w/ azotemia, creat 28. Missed HD x 2 wks. Pt is homeless, see below. ?Homelessness - major issue w/ HD pt,  needs assistance with placement, SW/case manager aware. Housing resources previously provided to patient in outpatient HD center.  ?  Fever: On admit, blood cultures NGTD. ?ESRD - on HD TTS. Next HD 5/13. ?HTN/volume: BP and edema improved. Below prior EDW, will need to lower on d/c. ?Atrial Fib - on coreg, eliquis ?Secondary HPTH: Ca/Phos ok, continue home binders/VDRA.  ?Anemia of ESRD: Hgb 8.3. S/p 2U PRBCs this admit. Continue Aranesp q Thursday while here. S/p episode epistaxis this admit. ?Dispo- SW working on placement  ? ?Veneta Penton, PA-C ?11/19/2021, 10:20 AM  ?[facility] ? ? ? ?

## 2021-11-19 NOTE — Progress Notes (Signed)
Occupational Therapy Treatment ?Patient Details ?Name: Raymond [name] ?MRN: [id number] ?DOB: [birth year] ?Today's Date: 11/19/2021 ? ? ?History of present illness 66 yo male with the past medical history of ESRD on HD, T/TH/Sat heart failure, COPD, paroxysmal atrial fibrillation who presented with dyspnea and chest pain. Apparently patient has missed 2 weeks of hemodialysis. Patient underwent emergent hemodialysis with ultrafiltration 3,5 L with improvement in volume status but not back to baseline; working diagnosis of volume overload in the setting of ESRD. ?  ?OT comments ? Patient received in supine and agreeable to OT session. Patient was supervision to get to EOB and donn socks. Patient ws able to stand at sink for bathing and grooming and had complaints of LLE pain when standing. Patient making good gains with OT treatment. Acute OT to continue to follow.   ? ?Recommendations for follow up therapy are one component of a multi-disciplinary discharge planning process, led by the attending physician.  Recommendations may be updated based on patient status, additional functional criteria and insurance authorization. ?   ?Follow Up Recommendations ? Skilled nursing-short term rehab (<3 hours/day)  ?  ?Assistance Recommended at Discharge Set up Supervision/Assistance  ?Patient can return home with the following ? A little help with walking and/or transfers;A little help with bathing/dressing/bathroom;Assistance with cooking/housework;Assist for transportation;Help with stairs or ramp for entrance ?  ?Equipment Recommendations ? None recommended by OT  ?  ?Recommendations for Other Services   ? ?  ?Precautions / Restrictions Precautions ?Precautions: Fall ?Restrictions ?Weight Bearing Restrictions: No  ? ? ?  ? ?Mobility Bed Mobility ?Overal bed mobility: Needs Assistance ?Bed Mobility: Supine to Sit ?  ?  ?Supine to sit: Supervision ?  ?  ?General bed mobility comments: for safety ?  ? ?Transfers ?Overall transfer  level: Needs assistance ?Equipment used: Rolling walker (2 wheels) ?Transfers: Sit to/from Stand ?Sit to Stand: Min guard ?  ?  ?  ?  ?  ?General transfer comment: cues for hand placement and min guard for safety ?  ?  ?Balance Overall balance assessment: Needs assistance ?  ?Sitting balance-Leahy Scale: Fair ?  ?  ?Standing balance support: No upper extremity supported, During functional activity ?Standing balance-Leahy Scale: Fair ?Standing balance comment: stood at sink for self care tasks ?  ?  ?  ?  ?  ?  ?  ?  ?  ?  ?  ?   ? ?ADL either performed or assessed with clinical judgement  ? ?ADL Overall ADL's : Needs assistance/impaired ?  ?  ?Grooming: Supervision/safety;Standing ?Grooming Details (indicate cue type and reason): at sink ?Upper Body Bathing: Supervision/ safety;Standing ?Upper Body Bathing Details (indicate cue type and reason): at sink ?Lower Body Bathing: Supervison/ safety;Sit to/from stand ?Lower Body Bathing Details (indicate cue type and reason): standing at sink for peri area cleaning ?Upper Body Dressing : Set up ?Upper Body Dressing Details (indicate cue type and reason): donned gown to cover back ?Lower Body Dressing: Supervision/safety;Sitting/lateral leans ?Lower Body Dressing Details (indicate cue type and reason): donn socks ?Toilet Transfer: Min guard;Rolling walker (2 wheels) ?Toilet Transfer Details (indicate cue type and reason): simulated to recliner ?  ?  ?  ?  ?  ?General ADL Comments: supervision for self care with LLE pain limiting patient standing tolerance ?  ? ?Extremity/Trunk Assessment   ?  ?  ?  ?  ?  ? ?Vision   ?  ?  ?Perception   ?  ?Praxis   ?  ? ?  Cognition Arousal/Alertness: Awake/alert ?Behavior During Therapy: [facility] for tasks assessed/performed ?Overall Cognitive Status: Within Functional Limits for tasks assessed ?  ?  ?  ?  ?  ?  ?  ?  ?  ?  ?  ?  ?  ?  ?  ?  ?General Comments: oriented to time, place, situation ?  ?  ?   ?Exercises   ? ?  ?Shoulder Instructions    ? ? ?  ?General Comments    ? ? ?Pertinent Vitals/ Pain       Pain Assessment ?Pain Assessment: Faces ?Faces Pain Scale: Hurts little more ?Pain Location: LLE ?Pain Descriptors / Indicators: Aching, Grimacing, Guarding ?Pain Intervention(s): Limited activity within patient's tolerance, Monitored during session, Patient requesting pain meds-RN notified ? ?Home Living   ?  ?  ?  ?  ?  ?  ?  ?  ?  ?  ?  ?  ?  ?  ?  ?  ?  ?  ? ?  ?Prior Functioning/Environment    ?  ?  ?  ?   ? ?Frequency ? Min 2X/week  ? ? ? ? ?  ?Progress Toward Goals ? ?OT Goals(current goals can now be found in the care plan section) ? Progress towards OT goals: Progressing toward goals ? ?Acute Rehab OT Goals ?Patient Stated Goal: go to rehab ?OT Goal Formulation: With patient ?Time For Goal Achievement: 12/01/21 ?Potential to Achieve Goals: Good ?ADL Goals ?Pt Will Perform Upper Body Bathing: with supervision;sitting ?Pt Will Perform Lower Body Bathing: with supervision;sit to/from stand ?Pt Will Perform Upper Body Dressing: with supervision;sitting ?Pt Will Perform Lower Body Dressing: with supervision;sit to/from stand ?Pt Will Transfer to Toilet: with supervision;ambulating ?Pt Will Perform Toileting - Clothing Manipulation and hygiene: with supervision;sit to/from stand  ?Plan Discharge plan remains appropriate   ? ?Co-evaluation ? ? ?   ?  ?  ?  ?  ? ?  ?AM-PAC OT "6 Clicks" Daily Activity     ?Outcome Measure ? ? Help from another person eating meals?: None ?Help from another person taking care of personal grooming?: A Little ?Help from another person toileting, which includes using toliet, bedpan, or urinal?: A Little ?Help from another person bathing (including washing, rinsing, drying)?: A Little ?Help from another person to put on and taking off regular upper body clothing?: A Little ?Help from another person to put on and taking off regular lower body clothing?: A Little ?6 Click Score: 19 ? ?  ?End of Session Equipment Utilized During  Treatment: Gait belt;Rolling walker (2 wheels) ? ?OT Visit Diagnosis: Unsteadiness on feet (R26.81);Muscle weakness (generalized) (M62.81);Pain ?Pain - Right/Left: Left ?Pain - part of body: Leg ?  ?Activity Tolerance Patient tolerated treatment well;Patient limited by pain ?  ?Patient Left in chair;with call bell/phone within reach;with chair alarm set ?  ?Nurse Communication Mobility status;Patient requests pain meds ?  ? ?   ? ?Time: 4696-2952 ?OT Time Calculation (min): 20 min ? ?Charges: OT General Charges ?$OT Visit: 1 Visit ?OT Treatments ?$Self Care/Home Management : 8-22 mins ? ?Lodema Hong, OTA ?Acute Rehabilitation Services  ?Pager [phone] ?Office [phone] ? ? ?[place] ?11/19/2021, 8:50 AM ?

## 2021-11-19 NOTE — Progress Notes (Signed)
Physical Therapy Treatment ?Patient Details ?Name: Raymond [name] ?MRN: [id number] ?DOB: [birth year] ?Today's Date: 11/19/2021 ? ? ?History of Present Illness 66 yo male presented to ED on 11/11/21 with dyspnea and chest pain. Apparently patient has missed 2 weeks of hemodialysis. Patient underwent emergent hemodialysis with ultrafiltration 3,5 L with improvement in volume status but not back to baseline; working diagnosis of volume overload in the setting of ESRD. PMH: ESRD on HDU TTS, COPD, hep C, HTN, CAD, PAD, COPD, crack cocaine dependence, A-fib, and medical noncompliance, hx of PEA arrest ? ?  ?PT Comments  ? ? Patient progressing towards physical therapy goals. Patient able to increase ambulation distance with RW and min guard. Continues to complain of LLE pain but reports this is an old injury from previous surgery x 2 years ago. I believe patient would be able to manage rollator for mobility to allow for energy conservation or pain management with seat. Continue to recommend SNF for ongoing Physical Therapy.    ?   ?Recommendations for follow up therapy are one component of a multi-disciplinary discharge planning process, led by the attending physician.  Recommendations may be updated based on patient status, additional functional criteria and insurance authorization. ? ?Follow Up Recommendations ? Skilled nursing-short term rehab (<3 hours/day) ?  ?  ?Assistance Recommended at Discharge Intermittent Supervision/Assistance  ?Patient can return home with the following Help with stairs or ramp for entrance;Assist for transportation;A little help with walking and/or transfers ?  ?Equipment Recommendations ? Rollator (4 wheels)  ?  ?Recommendations for Other Services   ? ? ?  ?Precautions / Restrictions Precautions ?Precautions: Fall ?Restrictions ?Weight Bearing Restrictions: No  ?  ? ?Mobility ? Bed Mobility ?Overal bed mobility: Needs Assistance ?Bed Mobility: Supine to Sit ?  ?  ?Supine to sit: Supervision ?  ?   ?  ?  ? ?Transfers ?Overall transfer level: Needs assistance ?Equipment used: Rolling Tameeka Luo (2 wheels) ?Transfers: Sit to/from Stand ?Sit to Stand: Min guard ?  ?  ?  ?  ?  ?General transfer comment: good recall of hand placement. min guard for safety ?  ? ?Ambulation/Gait ?Ambulation/Gait assistance: Min guard ?Gait Distance (Feet): 120 Feet ?Assistive device: Rolling Jonerik Sliker (2 wheels) ?Gait Pattern/deviations: Step-to pattern, Step-through pattern ?Gait velocity: decreased ?  ?  ?General Gait Details: initially step to pattern due to LLE pain but able to progress to short step through pattern. ? ? ?Stairs ?  ?  ?  ?  ?  ? ? ?Wheelchair Mobility ?  ? ?Modified Rankin (Stroke Patients Only) ?  ? ? ?  ?Balance Overall balance assessment: Needs assistance ?  ?Sitting balance-Leahy Scale: Fair ?  ?  ?Standing balance support: No upper extremity supported, During functional activity ?Standing balance-Leahy Scale: Fair ?  ?  ?  ?  ?  ?  ?  ?  ?  ?  ?  ?  ?  ? ?  ?Cognition Arousal/Alertness: Awake/alert ?Behavior During Therapy: [facility] for tasks assessed/performed ?Overall Cognitive Status: Within Functional Limits for tasks assessed ?  ?  ?  ?  ?  ?  ?  ?  ?  ?  ?  ?  ?  ?  ?  ?  ?  ?  ?  ? ?  ?Exercises General Exercises - Lower Extremity ?Hip Flexion/Marching: Both, 10 reps, Standing ?Other Exercises ?Other Exercises: sit to stand x 10 ? ?  ?General Comments   ?  ?  ? ?Pertinent Vitals/Pain  Pain Assessment ?Pain Assessment: Faces ?Faces Pain Scale: Hurts little more ?Pain Location: LLE ?Pain Descriptors / Indicators: Aching, Grimacing, Guarding ?Pain Intervention(s): Monitored during session  ? ? ?Home Living   ?  ?  ?  ?  ?  ?  ?  ?  ?  ?   ?  ?Prior Function    ?  ?  ?   ? ?PT Goals (current goals can now be found in the care plan section) Acute Rehab PT Goals ?PT Goal Formulation: With patient ?Time For Goal Achievement: 12/01/21 ?Potential to Achieve Goals: Good ?Progress towards PT goals: Progressing toward  goals ? ?  ?Frequency ? ? ? Min 2X/week ? ? ? ?  ?PT Plan Current plan remains appropriate  ? ? ?Co-evaluation   ?  ?  ?  ?  ? ?  ?AM-PAC PT "6 Clicks" Mobility   ?Outcome Measure ? Help needed turning from your back to your side while in a flat bed without using bedrails?: None ?Help needed moving from lying on your back to sitting on the side of a flat bed without using bedrails?: A Little ?Help needed moving to and from a bed to a chair (including a wheelchair)?: A Little ?Help needed standing up from a chair using your arms (e.g., wheelchair or bedside chair)?: A Little ?Help needed to walk in hospital room?: A Little ?Help needed climbing 3-5 steps with a railing? : A Lot ?6 Click Score: 18 ? ?  ?End of Session Equipment Utilized During Treatment: Gait belt ?Activity Tolerance: Patient tolerated treatment well ?Patient left: in chair;with call bell/phone within reach;with chair alarm set ?Nurse Communication: Mobility status ?PT Visit Diagnosis: Unsteadiness on feet (R26.81);Other abnormalities of gait and mobility (R26.89);Pain;Muscle weakness (generalized) (M62.81) ?Pain - Right/Left: Left ?Pain - part of body: Leg ?  ? ? ?Time: 1141-1200 ?PT Time Calculation (min) (ACUTE ONLY): 19 min ? ?Charges:  $Therapeutic Activity: 8-22 mins          ?          ? ?Davie Sagona A. Dan Humphreys, PT, DPT ?Acute Rehabilitation Services ?Pager [phone] ?Office [phone] ? ? ? ?Mikella Linsley A Armon Orvis ?11/19/2021, 1:24 PM ? ?

## 2021-11-19 NOTE — Progress Notes (Signed)
Patient seen and examined ?-No changes from my note 5/11, dialyzed yesterday ?-Awaiting placement, TOC following ? ?Domenic Polite MD ?

## 2021-11-20 DIAGNOSIS — N186 End stage renal disease: Secondary | ICD-10-CM | POA: Diagnosis not present

## 2021-11-20 DIAGNOSIS — Z992 Dependence on renal dialysis: Secondary | ICD-10-CM | POA: Diagnosis not present

## 2021-11-20 LAB — CBC
HCT: 25 % — ABNORMAL LOW (ref 39.0–52.0)
Hemoglobin: 7.5 g/dL — ABNORMAL LOW (ref 13.0–17.0)
MCH: 29.6 pg (ref 26.0–34.0)
MCHC: 30 g/dL (ref 30.0–36.0)
MCV: 98.8 fL (ref 80.0–100.0)
Platelets: 219 10*3/uL (ref 150–400)
RBC: 2.53 MIL/uL — ABNORMAL LOW (ref 4.22–5.81)
RDW: 16.6 % — ABNORMAL HIGH (ref 11.5–15.5)
WBC: 4.1 10*3/uL (ref 4.0–10.5)
nRBC: 0 % (ref 0.0–0.2)

## 2021-11-20 LAB — BASIC METABOLIC PANEL
Anion gap: 8 (ref 5–15)
BUN: 32 mg/dL — ABNORMAL HIGH (ref 8–23)
CO2: 30 mmol/L (ref 22–32)
Calcium: 8.2 mg/dL — ABNORMAL LOW (ref 8.9–10.3)
Chloride: 94 mmol/L — ABNORMAL LOW (ref 98–111)
Creatinine, Ser: 7.51 mg/dL — ABNORMAL HIGH (ref 0.61–1.24)
GFR, Estimated: 7 mL/min — ABNORMAL LOW (ref >60)
Glucose, Bld: 94 mg/dL (ref 70–99)
Potassium: 5.1 mmol/L (ref 3.5–5.1)
Sodium: 132 mmol/L — ABNORMAL LOW (ref 135–145)

## 2021-11-20 NOTE — Plan of Care (Signed)
  Problem: Nutrition: Goal: Adequate nutrition will be maintained Outcome: Progressing   Problem: Coping: Goal: Level of anxiety will decrease Outcome: Progressing   

## 2021-11-20 NOTE — Progress Notes (Signed)
?[facility] ?Progress Note  ? ?Subjective:  Seen in room - and then later on HD, 2L UFG and tolerating. No CP/dyspnea. ? ?Objective ?Vitals:  ? 11/20/21 0840 11/20/21 0906 11/20/21 0916 11/20/21 0930  ?BP:  116/65 121/62 132/67  ?Pulse:  66 63 70  ?Resp:  '14 16 16  '$ ?Temp:  97.9 ?F (36.6 ?C)    ?TempSrc:  Oral    ?SpO2: 98% 93%    ?Weight:  70.6 kg    ?Height:      ? ?Physical Exam ?General: Chronically ill appearing, NAD. Room air. ?Heart: RRR; no murmur ?Lungs: CTAB; no wheezing ?Abdomen: soft ?Extremities: No LE edema ?Dialysis Access: LUE AVF + thrill ? ?Additional Objective ?Labs: ?Basic Metabolic Panel: ?Recent Labs  ?Lab 11/15/21 ?2355 11/16/21 ?0725 11/17/21 ?0350 11/20/21 ?0342  ?NA 134* 135 135 132*  ?K 4.5 4.6 4.1 5.1  ?CL 100 97* 98 94*  ?CO2 '25 26 30 30  '$ ?GLUCOSE 101* 100* 89 94  ?BUN 42* 54* 19 32*  ?CREATININE 10.72* 13.14* 6.41* 7.51*  ?CALCIUM 8.1* 8.0* 8.3* 8.2*  ?PHOS 4.2 4.5  --   --   ? ?Liver Function Tests: ?Recent Labs  ?Lab 11/15/21 ?0456 11/16/21 ?0725  ?ALBUMIN 2.3* 2.2*  ? ?CBC: ?Recent Labs  ?Lab 11/15/21 ?7322 11/16/21 ?0725 11/17/21 ?0350 11/19/21 ?0254 11/20/21 ?2706  ?WBC 2.5* 3.5* 3.9* 3.9* 4.1  ?NEUTROABS 1.4*  --   --   --   --   ?HGB 6.7* 6.9* 9.0* 8.3* 7.5*  ?HCT 21.2* 21.2* 27.6* 26.7* 25.0*  ?MCV 98.6 96.4 94.2 97.8 98.8  ?PLT 138* 140* 179 202 219  ? ?Medications: ? sodium chloride    ? ? sodium chloride   Intravenous Once  ? sodium chloride   Intravenous Once  ? sodium chloride   Intravenous Once  ? amiodarone  200 mg Oral Daily  ? amLODipine  5 mg Oral QHS  ? aspirin EC  81 mg Oral Daily  ? calcitRIOL  0.5 mcg Oral Q T,Th,Sa-HD  ? carvedilol  12.5 mg Oral BID  ? cinacalcet  180 mg Oral Q T,Th,Sa-HD  ? darbepoetin (ARANESP) injection - DIALYSIS  200 mcg Intravenous Q Thu-HD  ? feeding supplement (NEPRO CARB STEADY)  237 mL Oral BID BM  ? fluticasone furoate-vilanterol  1 puff Inhalation Daily  ? folic acid  1 mg Oral Daily  ? heparin  5,000 Units Subcutaneous  Q12H  ? multivitamin  1 tablet Oral QHS  ? pantoprazole  40 mg Oral BID AC  ? rosuvastatin  10 mg Oral Daily  ? sevelamer carbonate  4,000 mg Oral TID WC  ? sodium chloride flush  3 mL Intravenous Q12H  ? thiamine  100 mg Oral Daily  ? umeclidinium bromide  1 puff Inhalation Daily  ? ? ?Dialysis Orders: ?[facility] TTS ?4h  400/1.5  68kg 2/2 bath  LFA AVF  Hep none  ? - last HD 4/22, post HD 68kg ? - last Hb 8.6 on 4/20 ? - mircera 222mg q2, last 4/13, was due 4/26 ? - venofer 50 q wk ? - calcitriol 0.5100m PO q HD ?  ?Assessment/Plan: ?Uremia - w/ azotemia, creat 28. Missed HD x 2 wks. Pt is homeless, see below. ?Homelessness - major issue w/ HD pt,  needs assistance with placement, SW/case manager aware. Housing resources previously provided to patient in outpatient HD center.  ?Fever: On admit, blood cultures NGTD. Now resolved. ?ESRD - on HD TTS -> HD today. ?  HTN/volume: BP and edema improved. UF as tolerated. ?Atrial Fib - on Coreg, eliquis ?Secondary HPTH: Ca/Phos ok, continue home binders/VDRA.  ?Anemia of ESRD: Hgb 7.5. S/p 2U PRBCs this admit. Continue Aranesp 286mg q Thursday while here. S/p episode epistaxis this admit. ?Nutrition: Alb low, continue supplements ?Dispo- SW working on placement  ? ?KVeneta Penton PA-C ?11/20/2021, 9:49 AM  ?[facility] ? ? ? ?

## 2021-11-20 NOTE — Progress Notes (Signed)
?PROGRESS NOTE ? ? ? ?Raymond [name]  GYI:[id number] DOB: [birth year] DOA: 11/11/2021 ?PCP: [name], FNP  ?66 yo homeless male with the past medical history of ESRD on HD, T/TH/Sat heart failure, COPD, paroxysmal atrial fibrillation who presented with dyspnea and chest pain. Apparently patient has missed 2 weeks of hemodialysis. Continue use crack cocaine. On the day of hospitalization he suffered acute chest pain that prompted him to go to urgent care.  In the ED he was volume overloaded, BUN was 178, creatinine was 28, potassium was 7.4, high-sensitivity troponin was 51, chest x-ray noted bilateral pulmonary vascular congestion. ?-Improved after hemodialysis ?-Now awaiting placement ? ? ?Subjective: ?-Feels okay overall, seen on dialysis ? ?Assessment and Plan: ? ?Pulmonary edema, hyperkalemia ?ESRD (end stage renal disease) on dialysis [facility]) ?-Missed HD X 2 weeks  ?-Homeless TOC following, plan for SNF  ?-Now euvolemic, continue HD per renal ?-Awaiting placement, no bed offers yet ? ?Acute on chronic diastolic CHF (congestive heart failure) ([place]) ?-Echocardiogram from 2022 with preserved LV systolic function, moderate TR ?-volume managed with hemodialysis ? ?Acute metabolic encephalopathy ?Uremic encephalopathy. ?-Resolved with HD ? ?COPD (chronic obstructive pulmonary disease) ([place]) ?Stable, no clinical signs of exacerbation ? ?Paroxysmal atrial fibrillation ([facility]) ?Continue with amiodarone ?Patient not on anticoagulation due to history of severe GI bleed.   ? ?Anemia of chronic disease ?-Transfused 2 units of PRBC this admission, continue weekly EPO ?-Iron stores adequate, may need to increase EPO dose, hemoglobin is 7.5 ? ?Hypertension ?-Stable, continue amlodipine.  ? ?Dyslipidemia, continue with rosuvastatin,.  ? ?Substance abuse ([place]) ?-Counseled ? ?DVT prophylaxis: Lovenox ?Code Status: Full code ?Family Communication: Discussed patient in detail, no family at bedside ?Disposition Plan: SNF when bed  available ? ?Consultants:  ?Nephrology ? ?Procedures:  ? ?Antimicrobials:  ? ? ?Objective: ?Vitals:  ? 11/20/21 0930 11/20/21 1000 11/20/21 1030 11/20/21 1100  ?BP: 132/67 121/65 117/68 105/68  ?Pulse: 70 68 68 71  ?Resp: '16 15 16 15  '$ ?Temp:      ?TempSrc:      ?SpO2:      ?Weight:      ?Height:      ? ? ?Intake/Output Summary (Last 24 hours) at 11/20/2021 1134 ?Last data filed at 11/20/2021 0900 ?Gross per 24 hour  ?Intake 220 ml  ?Output --  ?Net 220 ml  ? ?Filed Weights  ? 11/18/21 1245 11/19/21 0500 11/20/21 0906  ?Weight: 64.9 kg 64.8 kg 70.6 kg  ? ? ?Examination: ? ?General exam: Chronically ill male seen on dialysis, AAO x2, no distress ?CVS: S1-S2, regular rhythm ?Lungs: Clear bilaterally ?Abdomen: Soft, nontender, bowel sounds present ?Extremities: No edema, left arm AV fistula ?Skin: No rashes on exposed skin ?Psychiatry:  Mood & affect appropriate.  ? ? ? ?Data Reviewed:  ? ?CBC: ?Recent Labs  ?Lab 11/15/21 ?3500 11/16/21 ?0725 11/17/21 ?0350 11/19/21 ?9381 11/20/21 ?8299  ?WBC 2.5* 3.5* 3.9* 3.9* 4.1  ?NEUTROABS 1.4*  --   --   --   --   ?HGB 6.7* 6.9* 9.0* 8.3* 7.5*  ?HCT 21.2* 21.2* 27.6* 26.7* 25.0*  ?MCV 98.6 96.4 94.2 97.8 98.8  ?PLT 138* 140* 179 202 219  ? ?Basic Metabolic Panel: ?Recent Labs  ?Lab 11/15/21 ?3716 11/16/21 ?0725 11/17/21 ?0350 11/20/21 ?0342  ?NA 134* 135 135 132*  ?K 4.5 4.6 4.1 5.1  ?CL 100 97* 98 94*  ?CO2 '25 26 30 30  '$ ?GLUCOSE 101* 100* 89 94  ?BUN 42* 54* 19 32*  ?CREATININE 10.72*  13.14* 6.41* 7.51*  ?CALCIUM 8.1* 8.0* 8.3* 8.2*  ?PHOS 4.2 4.5  --   --   ? ?GFR: ?Estimated Creatinine Clearance: 9.1 mL/min (A) (by C-G formula based on SCr of 7.51 mg/dL (H)). ?Liver Function Tests: ?Recent Labs  ?Lab 11/15/21 ?0456 11/16/21 ?0725  ?ALBUMIN 2.3* 2.2*  ? ?No results for input(s): LIPASE, AMYLASE in the last 168 hours. ?No results for input(s): AMMONIA in the last 168 hours. ?Coagulation Profile: ?No results for input(s): INR, PROTIME in the last 168 hours. ?Cardiac Enzymes: ?No  results for input(s): CKTOTAL, CKMB, CKMBINDEX, TROPONINI in the last 168 hours. ?BNP (last 3 results) ?No results for input(s): PROBNP in the last 8760 hours. ?HbA1C: ?No results for input(s): HGBA1C in the last 72 hours. ?CBG: ?No results for input(s): GLUCAP in the last 168 hours. ? ?Lipid Profile: ?No results for input(s): CHOL, HDL, LDLCALC, TRIG, CHOLHDL, LDLDIRECT in the last 72 hours. ?Thyroid Function Tests: ?No results for input(s): TSH, T4TOTAL, FREET4, T3FREE, THYROIDAB in the last 72 hours. ?Anemia Panel: ?No results for input(s): VITAMINB12, FOLATE, FERRITIN, TIBC, IRON, RETICCTPCT in the last 72 hours. ?Urine analysis: ?   ?Component Value Date/Time  ? [place] YELLOW 05/08/2010 1335  ? APPEARANCEUR CLEAR 05/08/2010 1335  ? LABSPEC 1.009 05/08/2010 1335  ? PHURINE 6.0 05/08/2010 1335  ? GLUCOSEU NEGATIVE 05/08/2010 1335  ? [place] NEGATIVE 05/08/2010 1335  ? [place] NEGATIVE 05/08/2010 1335  ? [place] NEGATIVE 05/08/2010 1335  ? PROTEINUR 100 (A) 05/08/2010 1335  ? UROBILINOGEN 0.2 05/08/2010 1335  ? NITRITE NEGATIVE 05/08/2010 1335  ? LEUKOCYTESUR SMALL (A) 05/08/2010 1335  ? ?Sepsis Labs: ?'@LABRCNTIP'$ (procalcitonin:4,lacticidven:4) ? ?) ?Recent Results (from the past 240 hour(s))  ?Culture, blood (Routine X 2) w Reflex to ID Panel     Status: None  ? Collection Time: 11/14/21  4:16 PM  ? Specimen: BLOOD RIGHT HAND  ?Result Value Ref Range Status  ? Specimen Description BLOOD RIGHT HAND  Final  ? Special Requests   Final  ?  BOTTLES DRAWN AEROBIC ONLY Blood Culture results may not be optimal due to an inadequate volume of blood received in culture bottles  ? Culture   Final  ?  NO GROWTH 5 DAYS ?Performed at [facility], [place] [address] [place], [place] [address] ?  ? Report Status 11/19/2021 FINAL  Final  ?Culture, blood (Routine X 2) w Reflex to ID Panel     Status: None  ? Collection Time: 11/14/21  4:16 PM  ? Specimen: BLOOD RIGHT WRIST  ?Result Value Ref Range Status  ? Specimen  Description BLOOD RIGHT WRIST  Final  ? Special Requests   Final  ?  BOTTLES DRAWN AEROBIC AND ANAEROBIC Blood Culture results may not be optimal due to an inadequate volume of blood received in culture bottles  ? Culture   Final  ?  NO GROWTH 5 DAYS ?Performed at [facility], [place] [address] [place], [place] [address] ?  ? Report Status 11/19/2021 FINAL  Final  ?  ? ?Radiology Studies: ?No results found. ? ? ?Scheduled Meds: ? sodium chloride   Intravenous Once  ? sodium chloride   Intravenous Once  ? sodium chloride   Intravenous Once  ? amiodarone  200 mg Oral Daily  ? amLODipine  5 mg Oral QHS  ? aspirin EC  81 mg Oral Daily  ? calcitRIOL  0.5 mcg Oral Q T,Th,Sa-HD  ? carvedilol  12.5 mg Oral BID  ? cinacalcet  180  mg Oral Q T,Th,Sa-HD  ? darbepoetin (ARANESP) injection - DIALYSIS  200 mcg Intravenous Q Thu-HD  ? feeding supplement (NEPRO CARB STEADY)  237 mL Oral BID BM  ? fluticasone furoate-vilanterol  1 puff Inhalation Daily  ? folic acid  1 mg Oral Daily  ? heparin  5,000 Units Subcutaneous Q12H  ? multivitamin  1 tablet Oral QHS  ? pantoprazole  40 mg Oral BID AC  ? rosuvastatin  10 mg Oral Daily  ? sevelamer carbonate  4,000 mg Oral TID WC  ? sodium chloride flush  3 mL Intravenous Q12H  ? thiamine  100 mg Oral Daily  ? umeclidinium bromide  1 puff Inhalation Daily  ? ?Continuous Infusions: ? sodium chloride    ? ? ? LOS: 9 days  ? ? ?Time spent: 19mn ? ?PDomenic Polite MD ?[facility] ? ? ?11/20/2021, 11:34 AM  ?  ?

## 2021-11-21 DIAGNOSIS — Z992 Dependence on renal dialysis: Secondary | ICD-10-CM | POA: Diagnosis not present

## 2021-11-21 DIAGNOSIS — N186 End stage renal disease: Secondary | ICD-10-CM | POA: Diagnosis not present

## 2021-11-21 LAB — CBC
HCT: 26.1 % — ABNORMAL LOW (ref 39.0–52.0)
Hemoglobin: 7.9 g/dL — ABNORMAL LOW (ref 13.0–17.0)
MCH: 30.2 pg (ref 26.0–34.0)
MCHC: 30.3 g/dL (ref 30.0–36.0)
MCV: 99.6 fL (ref 80.0–100.0)
Platelets: 226 10*3/uL (ref 150–400)
RBC: 2.62 MIL/uL — ABNORMAL LOW (ref 4.22–5.81)
RDW: 16.8 % — ABNORMAL HIGH (ref 11.5–15.5)
WBC: 3.9 10*3/uL — ABNORMAL LOW (ref 4.0–10.5)
nRBC: 0 % (ref 0.0–0.2)

## 2021-11-21 NOTE — Plan of Care (Signed)
  Problem: Clinical Measurements: Goal: Respiratory complications will improve Outcome: Progressing   Problem: Clinical Measurements: Goal: Cardiovascular complication will be avoided Outcome: Progressing   Problem: Nutrition: Goal: Adequate nutrition will be maintained Outcome: Progressing   

## 2021-11-21 NOTE — Progress Notes (Addendum)
?[facility] ?Progress Note  ? ?Subjective:   Seen in room. No overnight issues. No issues with HD yesterday - net 2L off. No CP/dyspnea. Placement pending. ? ?Objective ?Vitals:  ? 11/21/21 0500 11/21/21 0535 11/21/21 0747 11/21/21 0748  ?BP:  119/72    ?Pulse:  74    ?Resp:  16    ?Temp:  98.4 ?F (36.9 ?C)    ?TempSrc:  Oral    ?SpO2:  99% 97% 97%  ?Weight: 68.6 kg     ?Height:      ? ?Physical Exam ?General: Chronically ill appearing, NAD. Room air. ?Heart: RRR; no murmur ?Lungs: CTAB; no wheezing ?Abdomen: soft ?Extremities: No LE edema ?Dialysis Access: LUE AVF + thrill ? ?Additional Objective ?Labs: ?Basic Metabolic Panel: ?Recent Labs  ?Lab 11/15/21 ?7116 11/16/21 ?0725 11/17/21 ?0350 11/20/21 ?0342  ?NA 134* 135 135 132*  ?K 4.5 4.6 4.1 5.1  ?CL 100 97* 98 94*  ?CO2 '25 26 30 30  '$ ?GLUCOSE 101* 100* 89 94  ?BUN 42* 54* 19 32*  ?CREATININE 10.72* 13.14* 6.41* 7.51*  ?CALCIUM 8.1* 8.0* 8.3* 8.2*  ?PHOS 4.2 4.5  --   --   ? ?Liver Function Tests: ?Recent Labs  ?Lab 11/15/21 ?0456 11/16/21 ?0725  ?ALBUMIN 2.3* 2.2*  ? ?CBC: ?Recent Labs  ?Lab 11/15/21 ?5790 11/16/21 ?0725 11/17/21 ?0350 11/19/21 ?3833 11/20/21 ?3832 11/21/21 ?0333  ?WBC 2.5* 3.5* 3.9* 3.9* 4.1 3.9*  ?NEUTROABS 1.4*  --   --   --   --   --   ?HGB 6.7* 6.9* 9.0* 8.3* 7.5* 7.9*  ?HCT 21.2* 21.2* 27.6* 26.7* 25.0* 26.1*  ?MCV 98.6 96.4 94.2 97.8 98.8 99.6  ?PLT 138* 140* 179 202 219 226  ? ?Medications: ? sodium chloride    ? ? sodium chloride   Intravenous Once  ? sodium chloride   Intravenous Once  ? sodium chloride   Intravenous Once  ? amiodarone  200 mg Oral Daily  ? amLODipine  5 mg Oral QHS  ? aspirin EC  81 mg Oral Daily  ? calcitRIOL  0.5 mcg Oral Q T,Th,Sa-HD  ? carvedilol  12.5 mg Oral BID  ? cinacalcet  180 mg Oral Q T,Th,Sa-HD  ? darbepoetin (ARANESP) injection - DIALYSIS  200 mcg Intravenous Q Thu-HD  ? feeding supplement (NEPRO CARB STEADY)  237 mL Oral BID BM  ? fluticasone furoate-vilanterol  1 puff Inhalation Daily  ?  folic acid  1 mg Oral Daily  ? heparin  5,000 Units Subcutaneous Q12H  ? multivitamin  1 tablet Oral QHS  ? pantoprazole  40 mg Oral BID AC  ? rosuvastatin  10 mg Oral Daily  ? sevelamer carbonate  4,000 mg Oral TID WC  ? sodium chloride flush  3 mL Intravenous Q12H  ? thiamine  100 mg Oral Daily  ? umeclidinium bromide  1 puff Inhalation Daily  ? ? ?Dialysis Orders: ?[facility] TTS ?4h  400/1.5  68kg 2/2 bath  LFA AVF  Hep none  ? - mircera 247mg q2, last 4/13, was due 4/26 ? - venofer 50 q wk ? - calcitriol 0.581m PO q HD ?  ?Assessment/Plan: ?Uremia - w/ azotemia, creat 28. Missed HD x 2 wks. Pt is homeless, see below. ?Homelessness - major issue w/ HD pt,  needs assistance with placement, SW/case manager aware. Housing resources previously provided to patient in outpatient HD center.  ?Fever: On admit, blood cultures NGTD. Now resolved. ?ESRD - on HD TTS -> next HD  5/16. ?HTN/volume: BP and edema improved. UF as tolerated. ?Atrial Fib - on Coreg. Eliquis has been on hold, possibly too high risk for bleed to resume which is appropriate. ?Secondary HPTH: Ca/Phos ok, continue home binders/VDRA.  ?Anemia of ESRD: Hgb 7.9. S/p 2U PRBCs this admit. Continue Aranesp 250mg q Thursday while here. S/p episode epistaxis this admit. ?Nutrition: Alb low, continue supplements ?Dispo- SW working on placement  ?  ? ?KVeneta Penton PA-C ?11/21/2021, 9:01 AM  ?[facility] ? ? ? ?

## 2021-11-21 NOTE — Progress Notes (Signed)
?PROGRESS NOTE ? ? ? ?PERETZ THIEME  JGG:[id number] DOB: October 17, 1955 DOA: 11/11/2021 ?PCP: [name], FNP  ?66 yo homeless male with the past medical history of ESRD on HD, T/TH/Sat heart failure, COPD, paroxysmal atrial fibrillation who presented with dyspnea and chest pain. Apparently patient has missed 2 weeks of hemodialysis. Continue use crack cocaine. On the day of hospitalization he suffered acute chest pain that prompted him to go to urgent care.  In the ED he was volume overloaded, BUN was 178, creatinine was 28, potassium was 7.4, high-sensitivity troponin was 51, chest x-ray noted bilateral pulmonary vascular congestion. ?-Improved after hemodialysis ?-Now awaiting placement ? ? ?Subjective: ?-Sleepy this morning, did not get much rest last night, no events overnight ? ?Assessment and Plan: ? ?Pulmonary edema, hyperkalemia ?ESRD (end stage renal disease) on dialysis [facility]) ?-Missed HD X 2 weeks  ?-Homeless TOC following, plan for SNF  ?-Now euvolemic, continue HD per renal ?-Awaiting placement, no bed offers yet ? ?Acute on chronic diastolic CHF (congestive heart failure) ([place]) ?-Echocardiogram from 2022 with preserved LV systolic function, moderate TR ?-volume managed with hemodialysis ? ?Acute metabolic encephalopathy ?Uremic encephalopathy. ?-Resolved with HD ? ?COPD (chronic obstructive pulmonary disease) ([place]) ?Stable, no clinical signs of exacerbation ? ?Paroxysmal atrial fibrillation (HCC) ?Continue with amiodarone ?Patient not on anticoagulation due to history of severe GI bleed.   ? ?Anemia of chronic disease ?-Transfused 2 units of PRBC this admission, continue weekly EPO ?-Iron stores adequate, may need to increase EPO dose ?-Hemoglobin 7.9 ? ?Hypertension ?-Stable, continue amlodipine.  ? ?Dyslipidemia, continue with rosuvastatin,.  ? ?Substance abuse ([place]) ?-Counseled ? ?DVT prophylaxis: Lovenox ?Code Status: Full code ?Family Communication: Discussed patient in detail, no family at  bedside ?Disposition Plan: SNF when bed available ? ?Consultants:  ?Nephrology ? ?Procedures:  ? ?Antimicrobials:  ? ? ?Objective: ?Vitals:  ? 11/21/21 0535 11/21/21 0747 11/21/21 0748 11/21/21 0915  ?BP: 119/72   139/74  ?Pulse: 74   66  ?Resp: 16   17  ?Temp: 98.4 ?F (36.9 ?C)   98.2 ?F (36.8 ?C)  ?TempSrc: Oral   Oral  ?SpO2: 99% 97% 97% 98%  ?Weight:      ?Height:      ? ? ?Intake/Output Summary (Last 24 hours) at 11/21/2021 1145 ?Last data filed at 11/20/2021 2229 ?Gross per 24 hour  ?Intake 543 ml  ?Output 1998 ml  ?Net -1455 ml  ? ?Filed Weights  ? 11/19/21 0500 11/20/21 0906 11/21/21 0500  ?Weight: 64.8 kg 70.6 kg 68.6 kg  ? ? ?Examination: ? ?General exam: Chronically ill male sleeping in bed, AAO x2, no distress ?CVS: S1-S2, regular rhythm ?Lungs: Decreased at the bases otherwise clear ?Abdomen: Soft, nontender, bowel sounds present ?Extremities: No edema, left arm AV fistula  ?Skin: No rashes on exposed skin ?Psychiatry:  Mood & affect appropriate.  ? ? ? ?Data Reviewed:  ? ?CBC: ?Recent Labs  ?Lab 11/15/21 ?5465 11/16/21 ?0725 11/17/21 ?0350 11/19/21 ?0354 11/20/21 ?6568 11/21/21 ?0333  ?WBC 2.5* 3.5* 3.9* 3.9* 4.1 3.9*  ?NEUTROABS 1.4*  --   --   --   --   --   ?HGB 6.7* 6.9* 9.0* 8.3* 7.5* 7.9*  ?HCT 21.2* 21.2* 27.6* 26.7* 25.0* 26.1*  ?MCV 98.6 96.4 94.2 97.8 98.8 99.6  ?PLT 138* 140* 179 202 219 226  ? ?Basic Metabolic Panel: ?Recent Labs  ?Lab 11/15/21 ?1275 11/16/21 ?0725 11/17/21 ?0350 11/20/21 ?0342  ?NA 134* 135 135 132*  ?K 4.5 4.6 4.1  5.1  ?CL 100 97* 98 94*  ?CO2 '25 26 30 30  '$ ?GLUCOSE 101* 100* 89 94  ?BUN 42* 54* 19 32*  ?CREATININE 10.72* 13.14* 6.41* 7.51*  ?CALCIUM 8.1* 8.0* 8.3* 8.2*  ?PHOS 4.2 4.5  --   --   ? ?GFR: ?Estimated Creatinine Clearance: 9.1 mL/min (A) (by C-G formula based on SCr of 7.51 mg/dL (H)). ?Liver Function Tests: ?Recent Labs  ?Lab 11/15/21 ?0456 11/16/21 ?0725  ?ALBUMIN 2.3* 2.2*  ? ?No results for input(s): LIPASE, AMYLASE in the last 168 hours. ?No results for  input(s): AMMONIA in the last 168 hours. ?Coagulation Profile: ?No results for input(s): INR, PROTIME in the last 168 hours. ?Cardiac Enzymes: ?No results for input(s): CKTOTAL, CKMB, CKMBINDEX, TROPONINI in the last 168 hours. ?BNP (last 3 results) ?No results for input(s): PROBNP in the last 8760 hours. ?HbA1C: ?No results for input(s): HGBA1C in the last 72 hours. ?CBG: ?No results for input(s): GLUCAP in the last 168 hours. ? ?Lipid Profile: ?No results for input(s): CHOL, HDL, LDLCALC, TRIG, CHOLHDL, LDLDIRECT in the last 72 hours. ?Thyroid Function Tests: ?No results for input(s): TSH, T4TOTAL, FREET4, T3FREE, THYROIDAB in the last 72 hours. ?Anemia Panel: ?No results for input(s): VITAMINB12, FOLATE, FERRITIN, TIBC, IRON, RETICCTPCT in the last 72 hours. ?Urine analysis: ?   ?Component Value Date/Time  ? [place] YELLOW 05/08/2010 1335  ? APPEARANCEUR CLEAR 05/08/2010 1335  ? LABSPEC 1.009 05/08/2010 1335  ? PHURINE 6.0 05/08/2010 1335  ? GLUCOSEU NEGATIVE 05/08/2010 1335  ? [place] NEGATIVE 05/08/2010 1335  ? [place] NEGATIVE 05/08/2010 1335  ? [place] NEGATIVE 05/08/2010 1335  ? PROTEINUR 100 (A) 05/08/2010 1335  ? UROBILINOGEN 0.2 05/08/2010 1335  ? NITRITE NEGATIVE 05/08/2010 1335  ? LEUKOCYTESUR SMALL (A) 05/08/2010 1335  ? ?Sepsis Labs: ?'@LABRCNTIP'$ (procalcitonin:4,lacticidven:4) ? ?) ?Recent Results (from the past 240 hour(s))  ?Culture, blood (Routine X 2) w Reflex to ID Panel     Status: None  ? Collection Time: 11/14/21  4:16 PM  ? Specimen: BLOOD RIGHT HAND  ?Result Value Ref Range Status  ? Specimen Description BLOOD RIGHT HAND  Final  ? Special Requests   Final  ?  BOTTLES DRAWN AEROBIC ONLY Blood Culture results may not be optimal due to an inadequate volume of blood received in culture bottles  ? Culture   Final  ?  NO GROWTH 5 DAYS ?Performed at [facility], [place] [address], [place], [place] [address] ?  ? Report Status 11/19/2021 FINAL  Final  ?Culture, blood (Routine X 2) w Reflex  to ID Panel     Status: None  ? Collection Time: 11/14/21  4:16 PM  ? Specimen: BLOOD RIGHT WRIST  ?Result Value Ref Range Status  ? Specimen Description BLOOD RIGHT WRIST  Final  ? Special Requests   Final  ?  BOTTLES DRAWN AEROBIC AND ANAEROBIC Blood Culture results may not be optimal due to an inadequate volume of blood received in culture bottles  ? Culture   Final  ?  NO GROWTH 5 DAYS ?Performed at [facility], [place] [address], [place], [place] [address] ?  ? Report Status 11/19/2021 FINAL  Final  ?  ? ?Radiology Studies: ?No results found. ? ? ?Scheduled Meds: ? sodium chloride   Intravenous Once  ? sodium chloride   Intravenous Once  ? sodium chloride   Intravenous Once  ? amiodarone  200 mg Oral Daily  ? amLODipine  5 mg Oral QHS  ? aspirin  EC  81 mg Oral Daily  ? calcitRIOL  0.5 mcg Oral Q T,Th,Sa-HD  ? carvedilol  12.5 mg Oral BID  ? cinacalcet  180 mg Oral Q T,Th,Sa-HD  ? darbepoetin (ARANESP) injection - DIALYSIS  200 mcg Intravenous Q Thu-HD  ? feeding supplement (NEPRO CARB STEADY)  237 mL Oral BID BM  ? fluticasone furoate-vilanterol  1 puff Inhalation Daily  ? folic acid  1 mg Oral Daily  ? heparin  5,000 Units Subcutaneous Q12H  ? multivitamin  1 tablet Oral QHS  ? pantoprazole  40 mg Oral BID AC  ? rosuvastatin  10 mg Oral Daily  ? sevelamer carbonate  4,000 mg Oral TID WC  ? sodium chloride flush  3 mL Intravenous Q12H  ? thiamine  100 mg Oral Daily  ? umeclidinium bromide  1 puff Inhalation Daily  ? ?Continuous Infusions: ? sodium chloride    ? ? ? LOS: 10 days  ? ? ?Time spent: 86mn ? ?PDomenic Polite MD ?[facility] ? ? ?11/21/2021, 11:45 AM  ?  ?

## 2021-11-22 NOTE — Progress Notes (Signed)
Mobility Specialist: Progress Note ? ? 11/22/21 1233  ?Mobility  ?Activity Ambulated with assistance in hallway  ?Level of Assistance Standby assist, set-up cues, supervision of patient - no hands on  ?Assistive Device Front wheel walker  ?Distance Ambulated (ft) 150 ft  ?Activity Response Tolerated well  ?$Mobility charge 1 Mobility  ? ?Pt received in bed and agreeable to mobility. C/o LLE pain he rated 7/10 during mobility. Pt sitting EOB after session with all needs met.  ? ?Raymond [name] ?Mobility Specialist ?Mobility Specialist [place]: [phone] ?Mobility Specialist [place]: [phone] ? ?

## 2021-11-22 NOTE — Progress Notes (Addendum)
?[facility] ?Progress Note  ? ?Subjective: Patient seen and examined at bedside.  Reports intermittent nose bleeds.  No other complaints.  Denies CP, SOB, abdominal pain and n/v/d.  Asking about the size of his aneurysms on his AVF.   ? ?Objective ?Vitals:  ? 11/21/21 1612 11/22/21 0019 11/22/21 0500 11/22/21 0959  ?BP: 119/69 115/74  127/62  ?Pulse: 75 71  71  ?Resp: '16 17  18  '$ ?Temp: 98.6 ?F (37 ?C) 98.4 ?F (36.9 ?C)  98.3 ?F (36.8 ?C)  ?TempSrc: Oral Oral    ?SpO2: 100% 98%  100%  ?Weight:   69.9 kg   ?Height:      ? ?Physical Exam ?General:well appearing male in NAD ?Heart:RRR, no mrg ?Lungs:CTAB,nml WOB on RA ?Abdomen:soft, NTNF ?Extremities:no LE edema ?Dialysis Access: aneurysmal LU AVF +b/t  ? ?Filed Weights  ? 11/20/21 0906 11/21/21 0500 11/22/21 0500  ?Weight: 70.6 kg 68.6 kg 69.9 kg  ? ? ?Intake/Output Summary (Last 24 hours) at 11/22/2021 1117 ?Last data filed at 11/22/2021 0800 ?Gross per 24 hour  ?Intake 520 ml  ?Output --  ?Net 520 ml  ? ? ?Additional Objective ?Labs: ?Basic Metabolic Panel: ?Recent Labs  ?Lab 11/16/21 ?0725 11/17/21 ?0350 11/20/21 ?0342  ?NA 135 135 132*  ?K 4.6 4.1 5.1  ?CL 97* 98 94*  ?CO2 '26 30 30  '$ ?GLUCOSE 100* 89 94  ?BUN 54* 19 32*  ?CREATININE 13.14* 6.41* 7.51*  ?CALCIUM 8.0* 8.3* 8.2*  ?PHOS 4.5  --   --   ? ?Liver Function Tests: ?Recent Labs  ?Lab 11/16/21 ?0725  ?ALBUMIN 2.2*  ? ?CBC: ?Recent Labs  ?Lab 11/16/21 ?0725 11/17/21 ?0350 11/19/21 ?2951 11/20/21 ?0342 11/21/21 ?0333  ?WBC 3.5* 3.9* 3.9* 4.1 3.9*  ?HGB 6.9* 9.0* 8.3* 7.5* 7.9*  ?HCT 21.2* 27.6* 26.7* 25.0* 26.1*  ?MCV 96.4 94.2 97.8 98.8 99.6  ?PLT 140* 179 202 219 226  ? ?Medications: ? sodium chloride    ? ? sodium chloride   Intravenous Once  ? sodium chloride   Intravenous Once  ? sodium chloride   Intravenous Once  ? amiodarone  200 mg Oral Daily  ? amLODipine  5 mg Oral QHS  ? aspirin EC  81 mg Oral Daily  ? calcitRIOL  0.5 mcg Oral Q T,Th,Sa-HD  ? carvedilol  12.5 mg Oral BID  ? cinacalcet   180 mg Oral Q T,Th,Sa-HD  ? darbepoetin (ARANESP) injection - DIALYSIS  200 mcg Intravenous Q Thu-HD  ? feeding supplement (NEPRO CARB STEADY)  237 mL Oral BID BM  ? fluticasone furoate-vilanterol  1 puff Inhalation Daily  ? folic acid  1 mg Oral Daily  ? heparin  5,000 Units Subcutaneous Q12H  ? multivitamin  1 tablet Oral QHS  ? pantoprazole  40 mg Oral BID AC  ? rosuvastatin  10 mg Oral Daily  ? sevelamer carbonate  4,000 mg Oral TID WC  ? sodium chloride flush  3 mL Intravenous Q12H  ? thiamine  100 mg Oral Daily  ? umeclidinium bromide  1 puff Inhalation Daily  ? ? ?Dialysis Orders: ?[facility] TTS ?4h  400/1.5  68kg 2/2 bath  LFA AVF  Hep none  ? - mircera 262mg q2, last 4/13, was due 4/26 ? - venofer 50 q wk ? - calcitriol 0.578m PO q HD ?  ?Assessment/Plan: ?Uremia - w/ azotemia, creat 28. Missed HD x 2 wks. Pt is homeless, see below. ?Homelessness - major issue w/ HD pt,  needs assistance  with placement, SW/case manager aware. Housing resources previously provided to patient in outpatient HD center.  ?Fever: On admit, blood cultures NGTD. Afebrile. ?ESRD - on HD TTS -> next HD 5/16. 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  Per their notes  "Given the age of the fistula I would only offer revision if there were scabbing or ulcerations overlying aneurysmal areas."  No indication to re-consult at this time.  ?HTN/volume: BP and edema improved. UF as tolerated. ?Atrial Fib - on Coreg. Eliquis has been on hold, possibly too high risk for bleed to resume which is appropriate. ?Secondary HPTH: Ca/Phos ok, continue home binders/VDRA.  ?Anemia of ESRD: Hgb 7.9. S/p 2U PRBCs this admit. Continue Aranesp 255mg q Thursday while here. S/p episode epistaxis this admit. ?Nutrition: Alb low, continue supplements ?Dispo- SW working on placement  ? ?LJen Mow PA-C ?[facility] ?11/22/2021,11:17 AM ? LOS: 11 days  ? ? ?

## 2021-11-22 NOTE — TOC Progression Note (Signed)
Transition of Care (TOC) - Initial/Assessment Note  ? ? ?Patient Details  ?Name: Raymond [name] ?MRN: [id number] ?Date of Birth: December 23, 1955 ? ?Transition of Care (TOC) CM/SW Contact:    ?Catalina Pizza Soley Harriss, LCSWA ?Phone Number: ?11/22/2021, 12:33 PM ? ?Clinical Narrative:                 ?The patient does not have any facilities that can accept.  CSW attempted to contact [facility] with [facility] again today and left a VM.  CSW sent a secure message to Lorenda Peck to request assistance with shelter placement. ? ?TOC will continue to follow. ? ?Expected Discharge Plan: Skilled Nursing Facility ?Barriers to Discharge: Continued Medical Work up, Homeless with medical needs, Family Issues, No SNF bed, Insurance Authorization ? ? ?Patient Goals and CMS Choice ?Patient states their goals for this hospitalization and ongoing recovery are:: To go to rehab ?CMS Medicare.gov Compare Post Acute Care list provided to:: Patient ?Choice offered to / list presented to : Patient ? ?Expected Discharge Plan and Services ?Expected Discharge Plan: Skilled Nursing Facility ?  ?  ?  ?Living arrangements for the past 2 months: Homeless ?                ?  ?  ?  ?  ?  ?  ?  ?  ?  ?  ? ?Prior Living Arrangements/Services ?Living arrangements for the past 2 months: Homeless ?Lives with:: Self ?Patient language and need for interpreter reviewed:: Yes ?Do you feel safe going back to the place where you live?: No   Homeless  ?Need for Family Participation in Patient Care: Yes (Comment) ?Care giver support system in place?: No (comment) ?  ?Criminal Activity/Legal Involvement Pertinent to Current Situation/Hospitalization: No - Comment as needed ? ?Activities of Daily Living ?Home Assistive Devices/Equipment: None ?ADL Screening (condition at time of admission) ?Patient's cognitive ability adequate to safely complete daily activities?: Yes ?Is the patient deaf or have difficulty hearing?: No ?Does the patient have difficulty seeing, even  when wearing glasses/contacts?: Yes ?Does the patient have difficulty concentrating, remembering, or making decisions?: No ?Patient able to express need for assistance with ADLs?: Yes ?Does the patient have difficulty dressing or bathing?: No ?Independently performs ADLs?: Yes (appropriate for developmental age) ?Does the patient have difficulty walking or climbing stairs?: No ?Weakness of Legs: Both ?Weakness of Arms/Hands: None ? ?Permission Sought/Granted ?Permission sought to share information with : Other (comment) (CSW) ?Permission granted to share information with : Yes, Verbal Permission Granted ?   ? Permission granted to share info w AGENCY: SNFs or shelters ?   ?   ? ?Emotional Assessment ?Appearance:: Appears older than stated age ?Attitude/Demeanor/Rapport: Engaged ?Affect (typically observed): Adaptable ?Orientation: : Oriented to Situation, Oriented to Place, Oriented to Self ?Alcohol / Substance Use: Illicit Drugs ?Psych Involvement: No (comment) ? ?Admission diagnosis:  Hyperkalemia [E87.5] ?CHF (congestive heart failure) (HCC) [I50.9] ?Uremia [N19] ?Patient Active Problem List  ? Diagnosis Date Noted  ? Hypertension   ? Paroxysmal atrial fibrillation (HCC)   ? Substance abuse (HCC)   ? Thrombocytopenia (HCC) 09/15/2021  ? Hypertensive urgency 09/13/2021  ? Uncontrolled hypertension   ? Uremia   ? Severe episode of recurrent major depressive disorder, without psychotic features (HCC)   ? ESRD (end stage renal disease) on dialysis (HCC) 05/18/2021  ? Cardiac arrest (HCC) 04/03/2021  ? Anisocoria 04/03/2021  ? Acute on chronic diastolic CHF (congestive heart failure) (HCC) 04/03/2021  ? Hypothermia  not due to cold exposure 04/03/2021  ? Junctional bradycardia 04/03/2021  ? Pancytopenia (HCC) 01/25/2021  ? AMS (altered mental status) 01/19/2021  ? 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 Acute upper GI bleed 07/29/2020  ?  Acute metabolic encephalopathy 07/29/2020  ? COVID-19 virus infection 07/29/2020  ? AVM (arteriovenous malformation) of stomach, acquired with hemorrhage   ? AVM (arteriovenous malformation) of small bowel, acquired with hemorrhage   ? Current use of long term anticoagulation   ? Sensorineural hearing loss (SNHL) of both ears 03/30/2020  ? History of arteriovenous malformation (AVM) 01/14/2020  ? UGIB (upper gastrointestinal bleed) 01/14/2020  ? Acute blood loss anemia 01/14/2020  ? Elevated troponin 01/14/2020  ? Allergy, unspecified, initial encounter 04/29/2019  ? Gastroenteritis 12/24/2018  ? Substance use disorder 12/24/2018  ? Type 2 diabetes mellitus with other diabetic kidney complication (HCC) 09/24/2018  ? Noncompliance of patient with renal dialysis 09/12/2018  ? Benign neoplasm of colon   ? Melena   ? Anemia   ? H/O medication noncompliance   ? Polysubstance (excluding opioids) dependence (HCC)   ? Atrial flutter with rapid ventricular response (HCC) 08/10/2018  ? Chest pain 08/10/2018  ? PAF (paroxysmal atrial fibrillation) (HCC) 08/01/2018  ? Acute respiratory failure with hypoxia (HCC) 07/13/2018  ? Non-compliance with renal dialysis 07/13/2018  ? PAD (peripheral artery disease) (HCC) 04/11/2018  ? Sepsis (HCC) 03/31/2018  ? Preop cardiovascular exam 03/29/2018  ? Idiopathic gout, unspecified site 12/13/2017  ? Other specified complication of vascular prosthetic devices, implants and grafts, initial encounter (HCC) 11/03/2017  ? Volume overload 08/07/2017  ? Dyspnea 06/20/2017  ? Acute bronchitis   ? COPD (chronic obstructive pulmonary disease) (HCC)   ? ESRD on hemodialysis (HCC)   ? Hypoxia 12/05/2016  ? Hyperkalemia 12/19/2015  ? Hypoglycemia 12/19/2015  ? Polysubstance abuse (HCC) 12/19/2015  ? Homeless 12/19/2015  ? Anemia associated with stage 5 chronic renal failure (HCC) 12/19/2015  ? BPH without urinary obstruction 10/29/2015  ? Erectile dysfunction 10/29/2015  ? Hematuria, gross 10/29/2015   ? Hypercalcemia 11/07/2014  ? Pruritus, unspecified 10/17/2014  ? Coagulation defect, unspecified (HCC) 09/17/2014  ? Thrombocytopenia, unspecified (HCC) 10/17/2013  ? Iron deficiency anemia, unspecified 08/28/2013  ? Atherosclerosis of native arteries of extremity with intermittent claudication (HCC) 12/04/2012  ? ESRD (end stage renal disease) (HCC) 12/04/2012  ? Hypertensive chronic kidney disease with stage 5 chronic kidney disease or end stage renal disease (HCC) 11/20/2012  ? Secondary hyperparathyroidism of renal origin (HCC) 11/20/2012  ? HEPATITIS C 09/07/2006  ? HYPERCHOLESTEROLEMIA 09/07/2006  ? HYPERTENSION, BENIGN SYSTEMIC 09/07/2006  ? ?PCP:  [name], FNP ?Pharmacy:   ?[facility] ?[address] ?[place] [address] ?Phone: [phone] Fax: [phone] ? ?[id number] - [place], [place] - [address] AT [facility] [address] ?[address] ?[place] [address] ?Phone: [phone] Fax: [phone] ? ? ? ? ?Social Determinants of Health (SDOH) Interventions ?  ? ?Readmission Risk Interventions ? ?  06/22/2020  ?  3:40 PM  ?Readmission Risk Prevention Plan  ?Transportation Screening Complete  ?Medication Review Oceanographer) Complete  ?PCP or Specialist appointment within 3-5 days of discharge Complete  ?HRI or Home Care Consult Complete  ?SW Recovery Care/Counseling Consult Complete  ?Palliative Care Screening Not Applicable  ?Skilled Nursing Facility Patient Refused  ? ? ? ?

## 2021-11-22 NOTE — Progress Notes (Signed)
Patient seen and examined, no changes from my note yesterday ?-Had some nosebleed last night ?-Remains stable overall, awaiting placement placement ?-Check CBC in a.m. ? ?Domenic Polite, MD ?

## 2021-11-22 NOTE — Progress Notes (Signed)
Occupational Therapy Treatment ?Patient Details ?Name: Raymond [name] ?MRN: [id number] ?DOB: [birth year] ?Today's Date: 11/22/2021 ? ? ?History of present illness 66 yo male presented to ED on 11/11/21 with dyspnea and chest pain. Apparently patient has missed 2 weeks of hemodialysis. Patient underwent emergent hemodialysis with ultrafiltration 3,5 L with improvement in volume status but not back to baseline; working diagnosis of volume overload in the setting of ESRD. PMH: ESRD on HDU TTS, COPD, hep C, HTN, CAD, PAD, COPD, crack cocaine dependence, A-fib, and medical noncompliance, hx of PEA arrest ?  ?OT comments ? Pt ambulated to bathroom for toileting and sink for grooming with R and supervision. Able to don socks without assist. Pt with c/o L LE pain he attributes to poor circulation. Limits standing and walking tolerance.   ? ?Recommendations for follow up therapy are one component of a multi-disciplinary discharge planning process, led by the attending physician.  Recommendations may be updated based on patient status, additional functional criteria and insurance authorization. ?   ?Follow Up Recommendations ? Skilled nursing-short term rehab (<3 hours/day)  ?  ?Assistance Recommended at Discharge Set up Supervision/Assistance  ?Patient can return home with the following ? A little help with walking and/or transfers;A little help with bathing/dressing/bathroom;Assistance with cooking/housework;Assist for transportation;Help with stairs or ramp for entrance ?  ?Equipment Recommendations ? None recommended by OT  ?  ?Recommendations for Other Services   ? ?  ?Precautions / Restrictions Precautions ?Precautions: Fall ?Restrictions ?Weight Bearing Restrictions: No  ? ? ?  ? ?Mobility Bed Mobility ?Overal bed mobility: Modified Independent ?  ?  ?  ?  ?  ?  ?  ?  ? ?Transfers ?Overall transfer level: Needs assistance ?Equipment used: Rolling walker (2 wheels) ?Transfers: Sit to/from Stand ?Sit to Stand: Supervision ?  ?   ?  ?  ?  ?General transfer comment: good technique, supervision for safety ?  ?  ?Balance Overall balance assessment: Needs assistance ?  ?Sitting balance-Leahy Scale: Good ?  ?  ?Standing balance support: No upper extremity supported, During functional activity ?Standing balance-Leahy Scale: Fair ?Standing balance comment: at sink ?  ?  ?  ?  ?  ?  ?  ?  ?  ?  ?  ?   ? ?ADL either performed or assessed with clinical judgement  ? ?ADL Overall ADL's : Needs assistance/impaired ?  ?  ?Grooming: Supervision/safety;Standing;Wash/dry hands ?Grooming Details (indicate cue type and reason): at sink ?  ?  ?  ?  ?  ?  ?Lower Body Dressing: Set up;Sitting/lateral leans ?  ?Toilet Transfer: Supervision/safety;Ambulation;Rolling walker (2 wheels) ?  ?Toileting- Clothing Manipulation and Hygiene: Supervision/safety;Sit to/from stand ?  ?  ?  ?Functional mobility during ADLs: Supervision/safety;Rolling walker (2 wheels) ?  ?  ? ?Extremity/Trunk Assessment   ?  ?  ?  ?  ?  ? ?Vision   ?  ?  ?Perception   ?  ?Praxis   ?  ? ?Cognition Arousal/Alertness: Awake/alert ?Behavior During Therapy: [facility] for tasks assessed/performed ?Overall Cognitive Status: Within Functional Limits for tasks assessed ?  ?  ?  ?  ?  ?  ?  ?  ?  ?  ?  ?  ?  ?  ?  ?  ?  ?  ?  ?   ?Exercises   ? ?  ?Shoulder Instructions   ? ? ?  ?General Comments    ? ? ?Pertinent Vitals/ Pain  Pain Assessment ?Pain Assessment: Faces ?Faces Pain Scale: Hurts little more ?Pain Location: LLE with weight bearing ?Pain Descriptors / Indicators: Aching, Grimacing, Guarding ?Pain Intervention(s): Monitored during session ? ?Home Living   ?  ?  ?  ?  ?  ?  ?  ?  ?  ?  ?  ?  ?  ?  ?  ?  ?  ?  ? ?  ?Prior Functioning/Environment    ?  ?  ?  ?   ? ?Frequency ? Min 2X/week  ? ? ? ? ?  ?Progress Toward Goals ? ?OT Goals(current goals can now be found in the care plan section) ? Progress towards OT goals: Progressing toward goals ? ?Acute Rehab OT Goals ?OT Goal Formulation: With  patient ?Time For Goal Achievement: 12/01/21 ?Potential to Achieve Goals: Good  ?Plan Discharge plan remains appropriate   ? ?Co-evaluation ? ? ?   ?  ?  ?  ?  ? ?  ?AM-PAC OT "6 Clicks" Daily Activity     ?Outcome Measure ? ? Help from another person eating meals?: None ?Help from another person taking care of personal grooming?: A Little ?Help from another person toileting, which includes using toliet, bedpan, or urinal?: A Little ?Help from another person bathing (including washing, rinsing, drying)?: A Little ?Help from another person to put on and taking off regular upper body clothing?: None ?Help from another person to put on and taking off regular lower body clothing?: A Little ?6 Click Score: 20 ? ?  ?End of Session Equipment Utilized During Treatment: Gait belt;Rolling walker (2 wheels) ? ?OT Visit Diagnosis: Unsteadiness on feet (R26.81);Muscle weakness (generalized) (M62.81);Pain ?  ?Activity Tolerance Patient tolerated treatment well;Patient limited by pain ?  ?Patient Left in bed;with call bell/phone within reach;with bed alarm set ?  ?Nurse Communication   ?  ? ?   ? ?Time: 6812-7517 ?OT Time Calculation (min): 29 min ? ?Charges: OT General Charges ?$OT Visit: 1 Visit ?OT Treatments ?$Self Care/Home Management : 23-37 mins ? ?Raymond [name], OTR/L ?Acute Rehabilitation Services ?Pager: [phone] ?Office: [phone]  ? ?Raymond [name] ?11/22/2021, 2:42 PM ?

## 2021-11-23 DIAGNOSIS — N186 End stage renal disease: Secondary | ICD-10-CM | POA: Diagnosis not present

## 2021-11-23 DIAGNOSIS — Z992 Dependence on renal dialysis: Secondary | ICD-10-CM | POA: Diagnosis not present

## 2021-11-23 LAB — CBC
HCT: 25.5 % — ABNORMAL LOW (ref 39.0–52.0)
Hemoglobin: 7.9 g/dL — ABNORMAL LOW (ref 13.0–17.0)
MCH: 30.9 pg (ref 26.0–34.0)
MCHC: 31 g/dL (ref 30.0–36.0)
MCV: 99.6 fL (ref 80.0–100.0)
Platelets: 257 10*3/uL (ref 150–400)
RBC: 2.56 MIL/uL — ABNORMAL LOW (ref 4.22–5.81)
RDW: 17.7 % — ABNORMAL HIGH (ref 11.5–15.5)
WBC: 4.3 10*3/uL (ref 4.0–10.5)
nRBC: 0 % (ref 0.0–0.2)

## 2021-11-23 LAB — BASIC METABOLIC PANEL
Anion gap: 8 (ref 5–15)
BUN: 47 mg/dL — ABNORMAL HIGH (ref 8–23)
CO2: 32 mmol/L (ref 22–32)
Calcium: 8.8 mg/dL — ABNORMAL LOW (ref 8.9–10.3)
Chloride: 96 mmol/L — ABNORMAL LOW (ref 98–111)
Creatinine, Ser: 10.7 mg/dL — ABNORMAL HIGH (ref 0.61–1.24)
GFR, Estimated: 5 mL/min — ABNORMAL LOW (ref >60)
Glucose, Bld: 82 mg/dL (ref 70–99)
Potassium: 5.5 mmol/L — ABNORMAL HIGH (ref 3.5–5.1)
Sodium: 136 mmol/L (ref 135–145)

## 2021-11-23 MED ORDER — CHLORHEXIDINE GLUCONATE CLOTH 2 % EX PADS
6.0000 | MEDICATED_PAD | Freq: Every day | CUTANEOUS | Status: DC
  Administered 2021-11-24 – 2021-11-25 (×2): 6 via TOPICAL

## 2021-11-23 NOTE — Progress Notes (Signed)
Physical Therapy Treatment ?Patient Details ?Name: Raymond [name] ?MRN: [id number] ?DOB: 10-25-1955 ?Today's Date: 11/23/2021 ? ? ?History of Present Illness 66 yo male presented to ED on 11/11/21 with dyspnea and chest pain. Apparently patient has missed 2 weeks of hemodialysis. Patient underwent emergent hemodialysis with ultrafiltration 3,5 L with improvement in volume status but not back to baseline; working diagnosis of volume overload in the setting of ESRD. PMH: ESRD on HDU TTS, COPD, hep C, HTN, CAD, PAD, COPD, crack cocaine dependence, A-fib, and medical noncompliance, hx of PEA arrest ? ?  ?PT Comments  ? ? Re-introduced pt to use of rollator (he owned one previously per his report). Patient required min cues for safe use of rollator, including step by step cues for transfer on/off rollator. Patient tolerated seated exercises well, however sit to stand exercise stopped after 2 reps due to reports on incr left knee pain.  ?   ?Recommendations for follow up therapy are one component of a multi-disciplinary discharge planning process, led by the attending physician.  Recommendations may be updated based on patient status, additional functional criteria and insurance authorization. ? ?Follow Up Recommendations ? Skilled nursing-short term rehab (<3 hours/day) ?  ?  ?Assistance Recommended at Discharge Intermittent Supervision/Assistance  ?Patient can return home with the following Help with stairs or ramp for entrance;Assist for transportation;A little help with walking and/or transfers ?  ?Equipment Recommendations ? Rollator (4 wheels)  ?  ?Recommendations for Other Services   ? ? ?  ?Precautions / Restrictions Precautions ?Precautions: Fall ?Restrictions ?Weight Bearing Restrictions: No  ?  ? ?Mobility ? Bed Mobility ?Overal bed mobility: Modified Independent ?Bed Mobility: Supine to Sit ?  ?  ?Supine to sit: Modified independent (Device/Increase time) ?  ?  ?  ?  ? ?Transfers ?Overall transfer level: Needs  assistance ?Equipment used: Rollator (4 wheels) ?Transfers: Sit to/from Stand ?Sit to Stand: Supervision ?  ?  ?  ?  ?  ?General transfer comment: vc for safe use of rollator/brakes; step by step cues to transfer on/off rollator ?  ? ?Ambulation/Gait ?Ambulation/Gait assistance: Min guard ?Gait Distance (Feet): 180 Feet ?Assistive device: Rolling walker (2 wheels) ?Gait Pattern/deviations: Step-through pattern, Decreased stride length ?Gait velocity: decreased ?  ?  ?General Gait Details: good use of rollator with upright posture; slightly shorter step length but appropriate considerin chronic LLE pain ? ? ?Stairs ?  ?  ?  ?  ?  ? ? ?Wheelchair Mobility ?  ? ?Modified Rankin (Stroke Patients Only) ?  ? ? ?  ?Balance Overall balance assessment: Needs assistance ?  ?Sitting balance-Leahy Scale: Good ?  ?  ?Standing balance support: No upper extremity supported, During functional activity ?Standing balance-Leahy Scale: Fair ?  ?  ?  ?  ?  ?  ?  ?  ?  ?  ?  ?  ?  ? ?  ?Cognition Arousal/Alertness: Awake/alert ?Behavior During Therapy: [facility] for tasks assessed/performed ?Overall Cognitive Status: Within Functional Limits for tasks assessed ?  ?  ?  ?  ?  ?  ?  ?  ?  ?  ?  ?  ?  ?  ?  ?  ?General Comments: oriented to time, place, situation ?  ?  ? ?  ?Exercises General Exercises - Lower Extremity ?Long Arc Quad: Both, 10 reps, Seated ?Hip Flexion/Marching: Both, 10 reps, Seated ?Heel Raises: Both, 10 reps, Seated ?Other Exercises ?Other Exercises: sit to stand x 3 with reported incr knee  pain and stopped ? ?  ?General Comments   ?  ?  ? ?Pertinent Vitals/Pain Pain Assessment ?Pain Assessment: Faces ?Faces Pain Scale: Hurts a little bit ?Pain Location: LLE with weight bearing ?Pain Descriptors / Indicators: Aching, Guarding ?Pain Intervention(s): Limited activity within patient's tolerance, Monitored during session  ? ? ?Home Living   ?  ?  ?  ?  ?  ?  ?  ?  ?  ?   ?  ?Prior Function    ?  ?  ?   ? ?PT Goals (current goals  can now be found in the care plan section) Acute Rehab PT Goals ?Patient Stated Goal: Reprots he wants to improve his health and his life ?Time For Goal Achievement: 12/01/21 ?Potential to Achieve Goals: Good ?Progress towards PT goals: Progressing toward goals ? ?  ?Frequency ? ? ? Min 2X/week ? ? ? ?  ?PT Plan Current plan remains appropriate  ? ? ?Co-evaluation   ?  ?  ?  ?  ? ?  ?AM-PAC PT "6 Clicks" Mobility   ?Outcome Measure ? Help needed turning from your back to your side while in a flat bed without using bedrails?: None ?Help needed moving from lying on your back to sitting on the side of a flat bed without using bedrails?: None ?Help needed moving to and from a bed to a chair (including a wheelchair)?: A Little ?Help needed standing up from a chair using your arms (e.g., wheelchair or bedside chair)?: A Little ?Help needed to walk in hospital room?: A Little ?Help needed climbing 3-5 steps with a railing? : A Lot ?6 Click Score: 19 ? ?  ?End of Session   ?Activity Tolerance: Patient tolerated treatment well ?Patient left: with call bell/phone within reach;in bed ?Nurse Communication: Mobility status ?PT Visit Diagnosis: Unsteadiness on feet (R26.81);Other abnormalities of gait and mobility (R26.89);Pain;Muscle weakness (generalized) (M62.81) ?Pain - Right/Left: Left ?Pain - part of body: Leg ?  ? ? ?Time: 2130-8657 ?PT Time Calculation (min) (ACUTE ONLY): 11 min ? ?Charges:  $Therapeutic Exercise: 8-22 mins          ?          ? ? ?Jerolyn Center, PT ?Acute Rehabilitation Services  ?Pager [phone] ?Office [phone] ? ? ? ?Scherrie November Tarahji Ramthun ?11/23/2021, 2:47 PM ? ?

## 2021-11-23 NOTE — TOC Progression Note (Signed)
Transition of Care (TOC) - Initial/Assessment Note  ? ? ?Patient Details  ?Name: Raymond [name] ?MRN: [id number] ?Date of Birth: 1955-07-31 ? ?Transition of Care (TOC) CM/SW Contact:    ?Catalina Pizza Kellin Bartling, LCSWA ?Phone Number: ?11/23/2021, 4:14 PM ? ?Clinical Narrative:                 ?CSW received response from [facility] in reference to the patient discharging to a shelter.  CSW was informed that the patient's medical needs would not be a good fit for a shelter placement. ? ?TOC will continue to follow.   ? ?Expected Discharge Plan: Skilled Nursing Facility ?Barriers to Discharge: Continued Medical Work up, Homeless with medical needs, Family Issues, No SNF bed, Insurance Authorization ? ? ?Patient Goals and CMS Choice ?Patient states their goals for this hospitalization and ongoing recovery are:: To go to rehab ?CMS Medicare.gov Compare Post Acute Care list provided to:: Patient ?Choice offered to / list presented to : Patient ? ?Expected Discharge Plan and Services ?Expected Discharge Plan: Skilled Nursing Facility ?  ?  ?  ?Living arrangements for the past 2 months: Homeless ?                ?  ?  ?  ?  ?  ?  ?  ?  ?  ?  ? ?Prior Living Arrangements/Services ?Living arrangements for the past 2 months: Homeless ?Lives with:: Self ?Patient language and need for interpreter reviewed:: Yes ?Do you feel safe going back to the place where you live?: No   Homeless  ?Need for Family Participation in Patient Care: Yes (Comment) ?Care giver support system in place?: No (comment) ?  ?Criminal Activity/Legal Involvement Pertinent to Current Situation/Hospitalization: No - Comment as needed ? ?Activities of Daily Living ?Home Assistive Devices/Equipment: None ?ADL Screening (condition at time of admission) ?Patient's cognitive ability adequate to safely complete daily activities?: Yes ?Is the patient deaf or have difficulty hearing?: No ?Does the patient have difficulty seeing, even when wearing glasses/contacts?: Yes ?Does  the patient have difficulty concentrating, remembering, or making decisions?: No ?Patient able to express need for assistance with ADLs?: Yes ?Does the patient have difficulty dressing or bathing?: No ?Independently performs ADLs?: Yes (appropriate for developmental age) ?Does the patient have difficulty walking or climbing stairs?: No ?Weakness of Legs: Both ?Weakness of Arms/Hands: None ? ?Permission Sought/Granted ?Permission sought to share information with : Other (comment) (CSW) ?Permission granted to share information with : Yes, Verbal Permission Granted ?   ? Permission granted to share info w AGENCY: SNFs or shelters ?   ?   ? ?Emotional Assessment ?Appearance:: Appears older than stated age ?Attitude/Demeanor/Rapport: Engaged ?Affect (typically observed): Adaptable ?Orientation: : Oriented to Situation, Oriented to Place, Oriented to Self ?Alcohol / Substance Use: Illicit Drugs ?Psych Involvement: No (comment) ? ?Admission diagnosis:  Hyperkalemia [E87.5] ?CHF (congestive heart failure) (HCC) [I50.9] ?Uremia [N19] ?Patient Active Problem List  ? Diagnosis Date Noted  ? Hypertension   ? Paroxysmal atrial fibrillation (HCC)   ? Substance abuse (HCC)   ? Thrombocytopenia (HCC) 09/15/2021  ? Hypertensive urgency 09/13/2021  ? Uncontrolled hypertension   ? Uremia   ? Severe episode of recurrent major depressive disorder, without psychotic features (HCC)   ? ESRD (end stage renal disease) on dialysis (HCC) 05/18/2021  ? Cardiac arrest (HCC) 04/03/2021  ? Anisocoria 04/03/2021  ? Acute on chronic diastolic CHF (congestive heart failure) (HCC) 04/03/2021  ? Hypothermia not due to cold exposure 04/03/2021  ?  Junctional bradycardia 04/03/2021  ? Pancytopenia (HCC) 01/25/2021  ? AMS (altered mental status) 01/19/2021  ? 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 Acute upper GI bleed 07/29/2020  ? Acute metabolic encephalopathy 07/29/2020  ?  COVID-19 virus infection 07/29/2020  ? AVM (arteriovenous malformation) of stomach, acquired with hemorrhage   ? AVM (arteriovenous malformation) of small bowel, acquired with hemorrhage   ? Current use of long term anticoagulation   ? Sensorineural hearing loss (SNHL) of both ears 03/30/2020  ? History of arteriovenous malformation (AVM) 01/14/2020  ? UGIB (upper gastrointestinal bleed) 01/14/2020  ? Acute blood loss anemia 01/14/2020  ? Elevated troponin 01/14/2020  ? Allergy, unspecified, initial encounter 04/29/2019  ? Gastroenteritis 12/24/2018  ? Substance use disorder 12/24/2018  ? Type 2 diabetes mellitus with other diabetic kidney complication (HCC) 09/24/2018  ? Noncompliance of patient with renal dialysis 09/12/2018  ? Benign neoplasm of colon   ? Melena   ? Anemia   ? H/O medication noncompliance   ? Polysubstance (excluding opioids) dependence (HCC)   ? Atrial flutter with rapid ventricular response (HCC) 08/10/2018  ? Chest pain 08/10/2018  ? PAF (paroxysmal atrial fibrillation) (HCC) 08/01/2018  ? Acute respiratory failure with hypoxia (HCC) 07/13/2018  ? Non-compliance with renal dialysis 07/13/2018  ? PAD (peripheral artery disease) (HCC) 04/11/2018  ? Sepsis (HCC) 03/31/2018  ? Preop cardiovascular exam 03/29/2018  ? Idiopathic gout, unspecified site 12/13/2017  ? Other specified complication of vascular prosthetic devices, implants and grafts, initial encounter (HCC) 11/03/2017  ? Volume overload 08/07/2017  ? Dyspnea 06/20/2017  ? Acute bronchitis   ? COPD (chronic obstructive pulmonary disease) (HCC)   ? ESRD on hemodialysis (HCC)   ? Hypoxia 12/05/2016  ? Hyperkalemia 12/19/2015  ? Hypoglycemia 12/19/2015  ? Polysubstance abuse (HCC) 12/19/2015  ? Homeless 12/19/2015  ? Anemia associated with stage 5 chronic renal failure (HCC) 12/19/2015  ? BPH without urinary obstruction 10/29/2015  ? Erectile dysfunction 10/29/2015  ? Hematuria, gross 10/29/2015  ? Hypercalcemia 11/07/2014  ? Pruritus,  unspecified 10/17/2014  ? Coagulation defect, unspecified (HCC) 09/17/2014  ? Thrombocytopenia, unspecified (HCC) 10/17/2013  ? Iron deficiency anemia, unspecified 08/28/2013  ? Atherosclerosis of native arteries of extremity with intermittent claudication (HCC) 12/04/2012  ? ESRD (end stage renal disease) (HCC) 12/04/2012  ? Hypertensive chronic kidney disease with stage 5 chronic kidney disease or end stage renal disease (HCC) 11/20/2012  ? Secondary hyperparathyroidism of renal origin (HCC) 11/20/2012  ? HEPATITIS C 09/07/2006  ? HYPERCHOLESTEROLEMIA 09/07/2006  ? HYPERTENSION, BENIGN SYSTEMIC 09/07/2006  ? ?PCP:  [name], FNP ?Pharmacy:   ?[facility] ?[address] ?[place] [address] ?Phone: [phone] Fax: [phone] ? ?[id number] - [place], [place] - [address] AT [place] [facility] OF [address] ?[address] ?[place] [address] ?Phone: [phone] Fax: [phone] ? ? ? ? ?Social Determinants of Health (SDOH) Interventions ?  ? ?Readmission Risk Interventions ? ?  06/22/2020  ?  3:40 PM  ?Readmission Risk Prevention Plan  ?Transportation Screening Complete  ?Medication Review Oceanographer) Complete  ?PCP or Specialist appointment within 3-5 days of discharge Complete  ?HRI or Home Care Consult Complete  ?SW Recovery Care/Counseling Consult Complete  ?Palliative Care Screening Not Applicable  ?Skilled Nursing Facility Patient Refused  ? ? ? ?

## 2021-11-23 NOTE — Progress Notes (Addendum)
?PROGRESS NOTE ? ? ? ?Raymond [name]  YBO:[id number] DOB: January 17, 1956 DOA: 11/11/2021 ?PCP: [name], FNP  ?66 yo homeless male with the past medical history of ESRD on HD, T/TH/Sat heart failure, COPD, paroxysmal atrial fibrillation who presented with dyspnea and chest pain. Apparently patient has missed 2 weeks of hemodialysis. Continue use crack cocaine. On the day of hospitalization he suffered acute chest pain that prompted him to go to urgent care.  In the ED he was volume overloaded, BUN was 178, creatinine was 28, potassium was 7.4, high-sensitivity troponin was 51, chest x-ray noted bilateral pulmonary vascular congestion. ?-Improved after hemodialysis ?-Now awaiting placement ? ? ?Subjective: ?-Spit up a small amount after a.m. meds, no nausea or vomiting yesterday, friend visiting at bedside today ? ?Assessment and Plan: ? ?Pulmonary edema, hyperkalemia ?ESRD (end stage renal disease) on dialysis [facility]) ?-Missed HD X 2 weeks  ?-Homeless TOC following, plan for SNF  ?-Now euvolemic, continue HD per renal, hyperkalemia noted on today's labs ?-Awaiting placement, no bed offers yet, remains medically stable ? ?Acute on chronic diastolic CHF (congestive heart failure) ([place]) ?-Echocardiogram from 2022 with preserved LV systolic function, moderate TR ?-volume managed with hemodialysis ? ?Acute metabolic encephalopathy ?[place] encephalopathy. ?-Resolved with HD ? ?COPD (chronic obstructive pulmonary disease) ([place]) ?Stable, no clinical signs of exacerbation ? ?Paroxysmal atrial fibrillation (HCC) ?Continue with amiodarone ?Patient not on anticoagulation due to history of severe GI bleed.   ? ?Anemia of chronic disease ?-Transfused 2 units of PRBC this admission, continue weekly EPO ?-Iron stores adequate, may need to increase EPO dose ?-Hemoglobin 7.9 ? ?Hypertension ?-Stable, continue amlodipine.  ? ?Dyslipidemia, continue with rosuvastatin,.  ? ?Substance abuse ([place]) ?-Counseled ? ?DVT prophylaxis:  Lovenox ?Code Status: Full code ?Family Communication: Discussed patient in detail, no family at bedside ?Disposition Plan: SNF when bed available, medically stable for DC ? ?Consultants:  ?Nephrology ? ?Procedures:  ? ?Antimicrobials:  ? ? ?Objective: ?Vitals:  ? 11/23/21 0600 11/23/21 0812 11/23/21 0813 11/23/21 0919  ?BP:    (!) 155/84  ?Pulse:    71  ?Resp:    17  ?Temp:    98 ?F (36.7 ?C)  ?TempSrc:      ?SpO2:  95% 95% 100%  ?Weight: 70.5 kg     ?Height: '5\' 8"'$  (1.727 m)     ? ? ?Intake/Output Summary (Last 24 hours) at 11/23/2021 1237 ?Last data filed at 11/23/2021 0800 ?Gross per 24 hour  ?Intake 900 ml  ?Output 0 ml  ?Net 900 ml  ? ?Filed Weights  ? 11/21/21 0500 11/22/21 0500 11/23/21 0600  ?Weight: 68.6 kg 69.9 kg 70.5 kg  ? ? ?Examination: ? ?General exam: Chronically ill male sitting up in bed, AAOx3, no distress ?CVS: S1-S2, regular rate rhythm ?Lungs: Decreased breath sounds at the bases otherwise clear ?Abdomen: Soft, nontender, bowel sounds present ?Extremities: No edema, left arm AV fistula ?Skin: No rashes on exposed skin ?Psychiatry:  Mood & affect appropriate.  ? ? ? ?Data Reviewed:  ? ?CBC: ?Recent Labs  ?Lab 11/17/21 ?0350 11/19/21 ?0946 11/20/21 ?0342 11/21/21 ?2778 11/23/21 ?2423  ?WBC 3.9* 3.9* 4.1 3.9* 4.3  ?HGB 9.0* 8.3* 7.5* 7.9* 7.9*  ?HCT 27.6* 26.7* 25.0* 26.1* 25.5*  ?MCV 94.2 97.8 98.8 99.6 99.6  ?PLT 179 202 219 226 257  ? ?Basic Metabolic Panel: ?Recent Labs  ?Lab 11/17/21 ?0350 11/20/21 ?0342 11/23/21 ?5361  ?NA 135 132* 136  ?K 4.1 5.1 5.5*  ?CL 98 94* 96*  ?CO2  30 30 32  ?GLUCOSE 89 94 82  ?BUN 19 32* 47*  ?CREATININE 6.41* 7.51* 10.70*  ?CALCIUM 8.3* 8.2* 8.8*  ? ?GFR: ?Estimated Creatinine Clearance: 6.4 mL/min (A) (by C-G formula based on SCr of 10.7 mg/dL (H)). ?Liver Function Tests: ?No results for input(s): AST, ALT, ALKPHOS, BILITOT, PROT, ALBUMIN in the last 168 hours. ? ?No results for input(s): LIPASE, AMYLASE in the last 168 hours. ?No results for input(s): AMMONIA in  the last 168 hours. ?Coagulation Profile: ?No results for input(s): INR, PROTIME in the last 168 hours. ?Cardiac Enzymes: ?No results for input(s): CKTOTAL, CKMB, CKMBINDEX, TROPONINI in the last 168 hours. ?BNP (last 3 results) ?No results for input(s): PROBNP in the last 8760 hours. ?HbA1C: ?No results for input(s): HGBA1C in the last 72 hours. ?CBG: ?No results for input(s): GLUCAP in the last 168 hours. ? ?Lipid Profile: ?No results for input(s): CHOL, HDL, LDLCALC, TRIG, CHOLHDL, LDLDIRECT in the last 72 hours. ?Thyroid Function Tests: ?No results for input(s): TSH, T4TOTAL, FREET4, T3FREE, THYROIDAB in the last 72 hours. ?Anemia Panel: ?No results for input(s): VITAMINB12, FOLATE, FERRITIN, TIBC, IRON, RETICCTPCT in the last 72 hours. ?Urine analysis: ?   ?Component Value Date/Time  ? [place] YELLOW 05/08/2010 1335  ? APPEARANCEUR CLEAR 05/08/2010 1335  ? LABSPEC 1.009 05/08/2010 1335  ? PHURINE 6.0 05/08/2010 1335  ? GLUCOSEU NEGATIVE 05/08/2010 1335  ? [place] NEGATIVE 05/08/2010 1335  ? [place] NEGATIVE 05/08/2010 1335  ? [place] NEGATIVE 05/08/2010 1335  ? PROTEINUR 100 (A) 05/08/2010 1335  ? UROBILINOGEN 0.2 05/08/2010 1335  ? NITRITE NEGATIVE 05/08/2010 1335  ? LEUKOCYTESUR SMALL (A) 05/08/2010 1335  ? ?Sepsis Labs: ?'@LABRCNTIP'$ (procalcitonin:4,lacticidven:4) ? ?) ?Recent Results (from the past 240 hour(s))  ?Culture, blood (Routine X 2) w Reflex to ID Panel     Status: None  ? Collection Time: 11/14/21  4:16 PM  ? Specimen: BLOOD RIGHT HAND  ?Result Value Ref Range Status  ? Specimen Description BLOOD RIGHT HAND  Final  ? Special Requests   Final  ?  BOTTLES DRAWN AEROBIC ONLY Blood Culture results may not be optimal due to an inadequate volume of blood received in culture bottles  ? Culture   Final  ?  NO GROWTH 5 DAYS ?Performed at [facility], [place] [address] [place], [place] [address] ?  ? Report Status 11/19/2021 FINAL  Final  ?Culture, blood (Routine X 2) w Reflex to ID Panel      Status: None  ? Collection Time: 11/14/21  4:16 PM  ? Specimen: BLOOD RIGHT WRIST  ?Result Value Ref Range Status  ? Specimen Description BLOOD RIGHT WRIST  Final  ? Special Requests   Final  ?  BOTTLES DRAWN AEROBIC AND ANAEROBIC Blood Culture results may not be optimal due to an inadequate volume of blood received in culture bottles  ? Culture   Final  ?  NO GROWTH 5 DAYS ?Performed at [facility], [place] [address] [place], [place] [address] ?  ? Report Status 11/19/2021 FINAL  Final  ?  ? ?Radiology Studies: ?No results found. ? ? ?Scheduled Meds: ? sodium chloride   Intravenous Once  ? sodium chloride   Intravenous Once  ? sodium chloride   Intravenous Once  ? amiodarone  200 mg Oral Daily  ? amLODipine  5 mg Oral QHS  ? aspirin EC  81 mg Oral Daily  ? calcitRIOL  0.5 mcg Oral Q T,Th,Sa-HD  ? carvedilol  12.5 mg Oral  BID  ? cinacalcet  180 mg Oral Q T,Th,Sa-HD  ? darbepoetin (ARANESP) injection - DIALYSIS  200 mcg Intravenous Q Thu-HD  ? feeding supplement (NEPRO CARB STEADY)  237 mL Oral BID BM  ? fluticasone furoate-vilanterol  1 puff Inhalation Daily  ? folic acid  1 mg Oral Daily  ? heparin  5,000 Units Subcutaneous Q12H  ? multivitamin  1 tablet Oral QHS  ? pantoprazole  40 mg Oral BID AC  ? rosuvastatin  10 mg Oral Daily  ? sevelamer carbonate  4,000 mg Oral TID WC  ? sodium chloride flush  3 mL Intravenous Q12H  ? thiamine  100 mg Oral Daily  ? umeclidinium bromide  1 puff Inhalation Daily  ? ?Continuous Infusions: ? sodium chloride    ? ? ? LOS: 12 days  ? ? ?Time spent: 74mn ? ?PDomenic Polite MD ?[facility] ? ? ?11/23/2021, 12:37 PM  ?  ?

## 2021-11-23 NOTE — Progress Notes (Signed)
?[facility] ?Progress Note  ? ?Subjective:   Patient seen and examined at bedside. No specific complaints.  Denies CP, SOB, abdominal pain and n/v/d.  No nose bleeds in last 24 hours.  Reminded of recommendations from [facility] regarding aneurysms on AVF - reported understanding.  ? ?Objective ?Vitals:  ? 11/23/21 0600 11/23/21 0812 11/23/21 0813 11/23/21 0919  ?BP:    (!) 155/84  ?Pulse:    71  ?Resp:    17  ?Temp:    98 ?F (36.7 ?C)  ?TempSrc:      ?SpO2:  95% 95% 100%  ?Weight: 70.5 kg     ?Height: '5\' 8"'$  (1.727 m)     ? ?Physical Exam ?General:WDWN male in NAD ?Heart:RRR, no mrg ?Lungs:CTAB, nml WOB on RA ?Abdomen:soft, NTND ?Extremities:trace LE edema ?Dialysis Access: aneurysmal LU AVF +b/t  ? ?Filed Weights  ? 11/21/21 0500 11/22/21 0500 11/23/21 0600  ?Weight: 68.6 kg 69.9 kg 70.5 kg  ? ? ?Intake/Output Summary (Last 24 hours) at 11/23/2021 1159 ?Last data filed at 11/23/2021 0800 ?Gross per 24 hour  ?Intake 900 ml  ?Output 0 ml  ?Net 900 ml  ? ? ?Additional Objective ?Labs: ?Basic Metabolic Panel: ?Recent Labs  ?Lab 11/17/21 ?0350 11/20/21 ?0342 11/23/21 ?6720  ?NA 135 132* 136  ?K 4.1 5.1 5.5*  ?CL 98 94* 96*  ?CO2 30 30 32  ?GLUCOSE 89 94 82  ?BUN 19 32* 47*  ?CREATININE 6.41* 7.51* 10.70*  ?CALCIUM 8.3* 8.2* 8.8*  ? ?CBC: ?Recent Labs  ?Lab 11/17/21 ?0350 11/19/21 ?0946 11/20/21 ?0342 11/21/21 ?9470 11/23/21 ?9628  ?WBC 3.9* 3.9* 4.1 3.9* 4.3  ?HGB 9.0* 8.3* 7.5* 7.9* 7.9*  ?HCT 27.6* 26.7* 25.0* 26.1* 25.5*  ?MCV 94.2 97.8 98.8 99.6 99.6  ?PLT 179 202 219 226 257  ? ?Medications: ? sodium chloride    ? ? sodium chloride   Intravenous Once  ? sodium chloride   Intravenous Once  ? sodium chloride   Intravenous Once  ? amiodarone  200 mg Oral Daily  ? amLODipine  5 mg Oral QHS  ? aspirin EC  81 mg Oral Daily  ? calcitRIOL  0.5 mcg Oral Q T,Th,Sa-HD  ? carvedilol  12.5 mg Oral BID  ? cinacalcet  180 mg Oral Q T,Th,Sa-HD  ? darbepoetin (ARANESP) injection - DIALYSIS  200 mcg Intravenous Q Thu-HD  ?  feeding supplement (NEPRO CARB STEADY)  237 mL Oral BID BM  ? fluticasone furoate-vilanterol  1 puff Inhalation Daily  ? folic acid  1 mg Oral Daily  ? heparin  5,000 Units Subcutaneous Q12H  ? multivitamin  1 tablet Oral QHS  ? pantoprazole  40 mg Oral BID AC  ? rosuvastatin  10 mg Oral Daily  ? sevelamer carbonate  4,000 mg Oral TID WC  ? sodium chloride flush  3 mL Intravenous Q12H  ? thiamine  100 mg Oral Daily  ? umeclidinium bromide  1 puff Inhalation Daily  ? ? ?Dialysis Orders: ?[facility] TTS ?4h  400/1.5  68kg 2/2 bath  LFA AVF  Hep none  ? - mircera 233mg q2, last 4/13, was due 4/26 ? - venofer 50 q wk ? - calcitriol 0.529m PO q HD ?  ?Assessment/Plan: ?Uremia - w/ azotemia, creat 28. Missed HD x 2 wks. Pt is homeless, see below. ?Homelessness - major issue w/ HD pt,  needs assistance with placement, SW/case manager aware - working on placement. Housing resources previously provided to patient in outpatient HD center.  ?  Fever: On admit, blood cultures NGTD. Afebrile. 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  Per their notes  "Given the age of the fistula I would only offer revision if there were scabbing or ulcerations overlying aneurysmal areas."  No indication to re-consult at this time.  ?HTN/volume:  BP elevated today, expect improvement post HD. UF as tolerated. ?Atrial Fib - on Coreg. Eliquis has been on hold, possibly too high risk for bleed to resume which is appropriate. ?Secondary HPTH: Ca/Phos ok, continue home binders/VDRA.  ?Anemia of ESRD: Hgb 7.9. S/p 2U PRBCs this admit. Continue Aranesp 246mg q Thursday while here. S/p episode epistaxis this admit. ?Nutrition: Alb low, continue supplements ?Dispo- SW working on placement  ? ?LJen Mow PA-C ?[facility] ?11/23/2021,11:59 AM ? LOS: 12 days  ? ? ?

## 2021-11-23 NOTE — Progress Notes (Signed)
Mobility Specialist: Progress Note ? ? 11/23/21 1032  ?Mobility  ?Activity Ambulated with assistance in hallway  ?Level of Assistance Standby assist, set-up cues, supervision of patient - no hands on  ?Assistive Device Front wheel walker  ?Distance Ambulated (ft) 230 ft  ?Activity Response Tolerated well  ?$Mobility charge 1 Mobility  ? ?Received pt in bed having no complaints and agreeable to mobility. Asymptomatic throughout ambulation, returned back to bed w/ call bell in reach and all needs met. ? ?Harrell Gave Jyllian Haynie ?Mobility Specialist ?Mobility Specialist [place]: [phone] ?Mobility Specialist [place]: [phone] ? ?

## 2021-11-23 NOTE — Plan of Care (Signed)
  Problem: Clinical Measurements: Goal: Respiratory complications will improve Outcome: Progressing   Problem: Clinical Measurements: Goal: Cardiovascular complication will be avoided Outcome: Progressing   Problem: Nutrition: Goal: Adequate nutrition will be maintained Outcome: Progressing   

## 2021-11-24 MED ORDER — NEPRO/CARBSTEADY PO LIQD
237.0000 mL | Freq: Three times a day (TID) | ORAL | Status: DC
  Administered 2021-11-24 – 2021-12-01 (×17): 237 mL via ORAL

## 2021-11-24 NOTE — Progress Notes (Signed)
Nutrition Follow-up ? ?DOCUMENTATION CODES:  ? ?Not applicable ? ?INTERVENTION:  ? ?Allow double portions on all meal trays; orders placed in [employer] system. Reached out to patient services team as well ? ?Increase Nepro Shake po to TID, each supplement provides 425 kcal and 19 grams protein  ? ?Continue Renal MVI ? ?NUTRITION DIAGNOSIS:  ? ?Increased nutrient needs related to chronic illness as evidenced by estimated needs. ? ?Being addressed via above interventions ? ?GOAL:  ? ?Patient will meet greater than or equal to 90% of their needs ? ?Progressing ? ?MONITOR:  ? ?PO intake, Supplement acceptance, Weight trends, Labs ? ?REASON FOR ASSESSMENT:  ? ?Malnutrition Screening Tool ?  ? ?ASSESSMENT:  ? ?66 yo male admitted with fever, volume overload, uremia with azotemia, hyperkalemia and Creatinine 28 after missing HD x 2 weeks-pt is homeless. PMH includes ESRD on HD, COPD, CHF, HTN, +crack cocaine use. ? ?Pt is medically stable but no safe discharge plan at present. Pt is homeless and requires iHD ? ?Pt reports appetite is good; still not receiving double portions at meal times ? ?Recorded po intake 75-100% of meals; pt also report drinking Nepro sometimes as well. Pt with Nepro at bedside, partially consumed.  ? ?Last BM documented as 5/11; pt reports he had a BM last night and has one daily ? ?Labs: potassium 5.5 (5/16)-not rechecked today, phosphorus wdl ?Meds:  senispar, aranesp, Renal MVI, Renvela 4000 mg TID, thiamine, folic acid, calcitriol ?  ? ?Diet Order:   ?Diet Order   ? ?       ?  Diet renal with fluid restriction Fluid restriction: Other (see comments); Room service appropriate? Yes; Fluid consistency: Thin  Diet effective now       ?  ? ?  ?  ? ?  ? ? ?EDUCATION NEEDS:  ? ?Not appropriate for education at this time ? ?Skin:  Skin Assessment: Reviewed RN Assessment ? ?Last BM:  5/17 ? ?Height:  ? ?Ht Readings from Last 1 Encounters:  ?11/23/21 '5\' 8"'$  (1.727 m)  ? ? ?Weight:  ? ?Wt Readings from  Last 1 Encounters:  ?11/23/21 70.5 kg  ? ? ?Ideal Body Weight:    ? ?BMI:  Body mass index is 23.63 kg/m?. ? ?Estimated Nutritional Needs:  ? ?Kcal:  2000-2300 kcals ? ?Protein:  100-120 g ? ?Fluid:  1000 mL plus UOP ? ? ?Kerman Passey MS, RDN, LDN, CNSC ?Registered Dietitian III ?Clinical Nutrition ?RD Pager and On-Call Pager Number Located in [place]  ? ?

## 2021-11-24 NOTE — Progress Notes (Signed)
?[facility] ?Progress Note  ? ?Subjective:   Patient seen and examined at bedside.  Tired this AM due to having HD overnight.  Denies CP, SOB, abdominal pain and n/v/d.   ? ?Objective ?Vitals:  ? 11/23/21 2049 11/24/21 0537 11/24/21 0903 11/24/21 0904  ?BP: 125/73 (!) 148/72  128/68  ?Pulse: 80 67  73  ?Resp: '18 18  19  '$ ?Temp: 98.9 ?F (37.2 ?C) 98.4 ?F (36.9 ?C)  98.9 ?F (37.2 ?C)  ?TempSrc: Oral Oral  Oral  ?SpO2: 96% 100% 100% 100%  ?Weight:      ?Height:      ? ?Physical Exam ?General:well appearing male in NAD ?Heart:RRR, no mrg ?Lungs:CTAB, nml WOB on RA ?Abdomen:soft, NTND ?Extremities:no LE edema ?Dialysis Access: aneurysmal LU AVF +b/t  ? ?Filed Weights  ? 11/23/21 0600 11/23/21 1545 11/23/21 1904  ?Weight: 70.5 kg 73.6 kg 70.5 kg  ? ? ?Intake/Output Summary (Last 24 hours) at 11/24/2021 1116 ?Last data filed at 11/24/2021 0900 ?Gross per 24 hour  ?Intake 540 ml  ?Output 3000 ml  ?Net -2460 ml  ? ? ?Additional Objective ?Labs: ?Basic Metabolic Panel: ?Recent Labs  ?Lab 11/20/21 ?1093 11/23/21 ?2355  ?NA 132* 136  ?K 5.1 5.5*  ?CL 94* 96*  ?CO2 30 32  ?GLUCOSE 94 82  ?BUN 32* 47*  ?CREATININE 7.51* 10.70*  ?CALCIUM 8.2* 8.8*  ? ?CBC: ?Recent Labs  ?Lab 11/19/21 ?0946 11/20/21 ?0342 11/21/21 ?7322 11/23/21 ?0254  ?WBC 3.9* 4.1 3.9* 4.3  ?HGB 8.3* 7.5* 7.9* 7.9*  ?HCT 26.7* 25.0* 26.1* 25.5*  ?MCV 97.8 98.8 99.6 99.6  ?PLT 202 219 226 257  ? ?Studies/Results: ?No results found. ? ?Medications: ? sodium chloride    ? ? sodium chloride   Intravenous Once  ? sodium chloride   Intravenous Once  ? sodium chloride   Intravenous Once  ? amiodarone  200 mg Oral Daily  ? amLODipine  5 mg Oral QHS  ? aspirin EC  81 mg Oral Daily  ? calcitRIOL  0.5 mcg Oral Q T,Th,Sa-HD  ? carvedilol  12.5 mg Oral BID  ? Chlorhexidine Gluconate Cloth  6 each Topical Q0600  ? cinacalcet  180 mg Oral Q T,Th,Sa-HD  ? darbepoetin (ARANESP) injection - DIALYSIS  200 mcg Intravenous Q Thu-HD  ? feeding supplement (NEPRO CARB  STEADY)  237 mL Oral BID BM  ? fluticasone furoate-vilanterol  1 puff Inhalation Daily  ? folic acid  1 mg Oral Daily  ? heparin  5,000 Units Subcutaneous Q12H  ? multivitamin  1 tablet Oral QHS  ? pantoprazole  40 mg Oral BID AC  ? rosuvastatin  10 mg Oral Daily  ? sevelamer carbonate  4,000 mg Oral TID WC  ? sodium chloride flush  3 mL Intravenous Q12H  ? thiamine  100 mg Oral Daily  ? umeclidinium bromide  1 puff Inhalation Daily  ? ? ?Dialysis Orders: ?[facility] TTS ?4h  400/1.5  68kg 2/2 bath  LFA AVF  Hep none  ? - mircera 21mg q2, last 4/13, was due 4/26 ? - venofer 50 q wk ? - calcitriol 0.564m PO q HD ?  ?Assessment/Plan: ?Uremia - w/ azotemia, creat 28. Now improved. Missed HD x 2 wks. Pt is homeless, see below. ?Homelessness - major issue w/ HD pt,  needs assistance with placement, SW/case manager aware - working on SNF placement. Housing resources previously provided to patient in outpatient HD center.  ?Fever: On admit, blood cultures NGTD. Afebrile. 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  Per their notes  "Given the age of the fistula I would only offer revision if there were scabbing or ulcerations overlying aneurysmal areas."  No indication to re-consult at this time.  ?HTN/volume:  BP improved today.  Continue home meds - amlodipine '5mg'$  qd, coreg 12.'5mg'$  BID. UF as tolerated. ?Atrial Fib - on Coreg. Eliquis has been on hold, possibly too high risk for bleed to resume which is appropriate. ?Secondary HPTH: Ca/Phos ok, continue home binders/VDRA.  ?Anemia of ESRD: Hgb 7.9. S/p 2U PRBCs this admit. Continue Aranesp 227mg q Thursday while here. S/p episode epistaxis this admit. ?Nutrition: Alb low, continue supplements ?Dispo- SW working on placement  ? ?LJen Mow PA-C ?[facility] ?11/24/2021,11:16 AM ? LOS: 13 days  ? ? ?

## 2021-11-24 NOTE — TOC Progression Note (Signed)
Transition of Care (TOC) - Initial/Assessment Note  ? ? ?Patient Details  ?Name: Raymond [name] ?MRN: [id number] ?Date of Birth: Aug 27, 1955 ? ?Transition of Care (TOC) CM/SW Contact:    ?Catalina Pizza Reganne Messerschmidt, LCSWA ?Phone Number: ?11/24/2021, 3:33 PM ? ?Clinical Narrative:                 ?CSW discussed case with [facility] leadership and met with the patient at bedside to inquire about the patient's ability to pay for a hotel room or a room at a boarding house due to no other discharge options being available.  The patient reported that he receives his checks on the 1st and 3rd and could discharge somewhere at the beginning of June. ? ?TOC will continue to follow.   ? ?Expected Discharge Plan: Skilled Nursing Facility ?Barriers to Discharge: Continued Medical Work up, Homeless with medical needs, Family Issues, No SNF bed, Insurance Authorization ? ? ?Patient Goals and CMS Choice ?Patient states their goals for this hospitalization and ongoing recovery are:: To go to rehab ?CMS Medicare.gov Compare Post Acute Care list provided to:: Patient ?Choice offered to / list presented to : Patient ? ?Expected Discharge Plan and Services ?Expected Discharge Plan: Skilled Nursing Facility ?  ?  ?  ?Living arrangements for the past 2 months: Homeless ?                ?  ?  ?  ?  ?  ?  ?  ?  ?  ?  ? ?Prior Living Arrangements/Services ?Living arrangements for the past 2 months: Homeless ?Lives with:: Self ?Patient language and need for interpreter reviewed:: Yes ?Do you feel safe going back to the place where you live?: No   Homeless  ?Need for Family Participation in Patient Care: Yes (Comment) ?Care giver support system in place?: No (comment) ?  ?Criminal Activity/Legal Involvement Pertinent to Current Situation/Hospitalization: No - Comment as needed ? ?Activities of Daily Living ?Home Assistive Devices/Equipment: None ?ADL Screening (condition at time of admission) ?Patient's cognitive ability adequate to safely complete daily  activities?: Yes ?Is the patient deaf or have difficulty hearing?: No ?Does the patient have difficulty seeing, even when wearing glasses/contacts?: Yes ?Does the patient have difficulty concentrating, remembering, or making decisions?: No ?Patient able to express need for assistance with ADLs?: Yes ?Does the patient have difficulty dressing or bathing?: No ?Independently performs ADLs?: Yes (appropriate for developmental age) ?Does the patient have difficulty walking or climbing stairs?: No ?Weakness of Legs: Both ?Weakness of Arms/Hands: None ? ?Permission Sought/Granted ?Permission sought to share information with : Other (comment) (CSW) ?Permission granted to share information with : Yes, Verbal Permission Granted ?   ? Permission granted to share info w AGENCY: SNFs or shelters ?   ?   ? ?Emotional Assessment ?Appearance:: Appears older than stated age ?Attitude/Demeanor/Rapport: Engaged ?Affect (typically observed): Adaptable ?Orientation: : Oriented to Situation, Oriented to Place, Oriented to Self ?Alcohol / Substance Use: Illicit Drugs ?Psych Involvement: No (comment) ? ?Admission diagnosis:  Hyperkalemia [E87.5] ?CHF (congestive heart failure) (HCC) [I50.9] ?Uremia [N19] ?Patient Active Problem List  ? Diagnosis Date Noted  ? Hypertension   ? Paroxysmal atrial fibrillation (HCC)   ? Substance abuse (HCC)   ? Thrombocytopenia (HCC) 09/15/2021  ? Hypertensive urgency 09/13/2021  ? Uncontrolled hypertension   ? Uremia   ? Severe episode of recurrent major depressive disorder, without psychotic features (HCC)   ? ESRD (end stage renal disease) on dialysis (HCC) 05/18/2021  ?  Cardiac arrest (HCC) 04/03/2021  ? Anisocoria 04/03/2021  ? Acute on chronic diastolic CHF (congestive heart failure) (HCC) 04/03/2021  ? Hypothermia not due to cold exposure 04/03/2021  ? Junctional bradycardia 04/03/2021  ? Pancytopenia (HCC) 01/25/2021  ? AMS (altered mental status) 01/19/2021  ? Chronic viral hepatitis C (HCC)  12/25/2020  ? 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 Acute upper GI bleed 07/29/2020  ? Acute metabolic encephalopathy 07/29/2020  ? COVID-19 virus infection 07/29/2020  ? AVM (arteriovenous malformation) of stomach, acquired with hemorrhage   ? AVM (arteriovenous malformation) of small bowel, acquired with hemorrhage   ? Current use of long term anticoagulation   ? Sensorineural hearing loss (SNHL) of both ears 03/30/2020  ? History of arteriovenous malformation (AVM) 01/14/2020  ? UGIB (upper gastrointestinal bleed) 01/14/2020  ? Acute blood loss anemia 01/14/2020  ? Elevated troponin 01/14/2020  ? Allergy, unspecified, initial encounter 04/29/2019  ? Gastroenteritis 12/24/2018  ? Substance use disorder 12/24/2018  ? Type 2 diabetes mellitus with other diabetic kidney complication (HCC) 09/24/2018  ? Noncompliance of patient with renal dialysis 09/12/2018  ? Benign neoplasm of colon   ? Melena   ? Anemia   ? H/O medication noncompliance   ? Polysubstance (excluding opioids) dependence (HCC)   ? Atrial flutter with rapid ventricular response (HCC) 08/10/2018  ? Chest pain 08/10/2018  ? PAF (paroxysmal atrial fibrillation) (HCC) 08/01/2018  ? Acute respiratory failure with hypoxia (HCC) 07/13/2018  ? Non-compliance with renal dialysis 07/13/2018  ? PAD (peripheral artery disease) (HCC) 04/11/2018  ? Sepsis (HCC) 03/31/2018  ? Preop cardiovascular exam 03/29/2018  ? Idiopathic gout, unspecified site 12/13/2017  ? Other specified complication of vascular prosthetic devices, implants and grafts, initial encounter (HCC) 11/03/2017  ? Volume overload 08/07/2017  ? Dyspnea 06/20/2017  ? Acute bronchitis   ? COPD (chronic obstructive pulmonary disease) (HCC)   ? ESRD on hemodialysis (HCC)   ? Hypoxia 12/05/2016  ? Hyperkalemia 12/19/2015  ? Hypoglycemia 12/19/2015  ? Polysubstance abuse (HCC) 12/19/2015  ? Homeless 12/19/2015  ? Anemia associated with stage 5 chronic renal failure (HCC)  12/19/2015  ? BPH without urinary obstruction 10/29/2015  ? Erectile dysfunction 10/29/2015  ? Hematuria, gross 10/29/2015  ? Hypercalcemia 11/07/2014  ? Pruritus, unspecified 10/17/2014  ? Coagulation defect, unspecified (HCC) 09/17/2014  ? Thrombocytopenia, unspecified (HCC) 10/17/2013  ? Iron deficiency anemia, unspecified 08/28/2013  ? Atherosclerosis of native arteries of extremity with intermittent claudication (HCC) 12/04/2012  ? ESRD (end stage renal disease) (HCC) 12/04/2012  ? Hypertensive chronic kidney disease with stage 5 chronic kidney disease or end stage renal disease (HCC) 11/20/2012  ? Secondary hyperparathyroidism of renal origin (HCC) 11/20/2012  ? HEPATITIS C 09/07/2006  ? HYPERCHOLESTEROLEMIA 09/07/2006  ? HYPERTENSION, BENIGN SYSTEMIC 09/07/2006  ? ?PCP:  [name], FNP ?Pharmacy:   ?[facility] ?[address] ?[place] [address] ?Phone: [phone] Fax: [phone] ? ?[id number] - [place],  - [address] AT [facility] [address] ?[address] ?[place] [address] ?Phone: [phone] Fax: [phone] ? ? ? ? ?Social Determinants of Health (SDOH) Interventions ?  ? ?Readmission Risk Interventions ? ?  06/22/2020  ?  3:40 PM  ?Readmission Risk Prevention Plan  ?Transportation Screening Complete  ?Medication Review Oceanographer) Complete  ?PCP or Specialist appointment within 3-5 days of discharge Complete  ?HRI or Home Care Consult Complete  ?  SW Recovery Care/Counseling Consult Complete  ?Palliative Care Screening Not Applicable  ?Skilled Nursing Facility Patient Refused  ? ? ? ?

## 2021-11-24 NOTE — Progress Notes (Signed)
Mobility Specialist: Progress Note ? ? 11/24/21 1131  ?Mobility  ?Activity Ambulated with assistance in hallway  ?Level of Assistance Modified independent, requires aide device or extra time  ?Assistive Device Front wheel walker  ?Distance Ambulated (ft) 530 ft  ?Activity Response Tolerated well  ?$Mobility charge 1 Mobility  ? ?Pr received in the bed and agreeable to ambulation. C/o LLE pain during session, no rating given. Stopped x3 for brief standing breaks secondary to pain during session. Pt sitting EOB after session with call bell and phone in reach.  ? ?Harrell Gave Keisi Eckford ?Mobility Specialist ?Mobility Specialist [place]: [phone] ?Mobility Specialist [place]: [phone] ? ?

## 2021-11-24 NOTE — Progress Notes (Signed)
Patient seen and examined, no events overnight, no changes from my note yesterday, remains medically stable ?-Disposition unknown, dialysis patient, currently homeless, TOC following ? ?Domenic Polite, MD ?

## 2021-11-25 DIAGNOSIS — Z992 Dependence on renal dialysis: Secondary | ICD-10-CM | POA: Diagnosis not present

## 2021-11-25 DIAGNOSIS — N186 End stage renal disease: Secondary | ICD-10-CM | POA: Diagnosis not present

## 2021-11-25 MED ORDER — POLYMYXIN B-TRIMETHOPRIM 10000-0.1 UNIT/ML-% OP SOLN
1.0000 [drp] | OPHTHALMIC | Status: DC
  Administered 2021-11-25 – 2021-12-01 (×24): 1 [drp] via OPHTHALMIC
  Filled 2021-11-25 (×2): qty 10

## 2021-11-25 MED ORDER — ASPIRIN 81 MG PO TBEC
81.0000 mg | DELAYED_RELEASE_TABLET | Freq: Every day | ORAL | Status: DC
  Administered 2021-11-26 – 2021-12-01 (×6): 81 mg via ORAL
  Filled 2021-11-25 (×7): qty 1

## 2021-11-25 NOTE — Progress Notes (Signed)
[facility] Progress Note   Subjective:   Patient seen and examined at bedside. Complains of right eye pain/itching and matting shut every morning.  No other specific complaints.   Objective Vitals:   11/25/21 0912 11/25/21 0930 11/25/21 1000 11/25/21 1030  BP: 118/70 127/67 128/76 112/64  Pulse: 70 69 75 71  Resp: '18 18 18   '$ Temp:      TempSrc:      SpO2:      Weight:      Height:       Physical Exam General:well appearing male in NAD, +scleral erythema R>L Heart:RRR, no mrg Lungs:CTAB, nml WOB Abdomen:soft, NTND Extremities:no LE edema Dialysis Access: LU AVF +b/t   Filed Weights   11/23/21 1904 11/25/21 0354 11/25/21 0903  Weight: 70.5 kg 69.1 kg 70 kg    Intake/Output Summary (Last 24 hours) at 11/25/2021 1058 Last data filed at 11/25/2021 0200 Gross per 24 hour  Intake 840 ml  Output 0 ml  Net 840 ml    Additional Objective Labs: Basic Metabolic Panel: Recent Labs  Lab 11/20/21 0342 11/23/21 0623  NA 132* 136  K 5.1 5.5*  CL 94* 96*  CO2 30 32  GLUCOSE 94 82  BUN 32* 47*  CREATININE 7.51* 10.70*  CALCIUM 8.2* 8.8*   CBC: Recent Labs  Lab 11/19/21 0946 11/20/21 0342 11/21/21 0333 11/23/21 0623  WBC 3.9* 4.1 3.9* 4.3  HGB 8.3* 7.5* 7.9* 7.9*  HCT 26.7* 25.0* 26.1* 25.5*  MCV 97.8 98.8 99.6 99.6  PLT 202 219 226 257   Medications:  sodium chloride      sodium chloride   Intravenous Once   sodium chloride   Intravenous Once   sodium chloride   Intravenous Once   amiodarone  200 mg Oral Daily   amLODipine  5 mg Oral QHS   aspirin EC  81 mg Oral Daily   calcitRIOL  0.5 mcg Oral Q T,Th,Sa-HD   carvedilol  12.5 mg Oral BID   Chlorhexidine Gluconate Cloth  6 each Topical Q0600   cinacalcet  180 mg Oral Q T,Th,Sa-HD   darbepoetin (ARANESP) injection - DIALYSIS  200 mcg Intravenous Q Thu-HD   feeding supplement (NEPRO CARB STEADY)  237 mL Oral TID BM   fluticasone furoate-vilanterol  1 puff Inhalation Daily   folic acid  1 mg  Oral Daily   heparin  5,000 Units Subcutaneous Q12H   multivitamin  1 tablet Oral QHS   pantoprazole  40 mg Oral BID AC   rosuvastatin  10 mg Oral Daily   sevelamer carbonate  4,000 mg Oral TID WC   sodium chloride flush  3 mL Intravenous Q12H   thiamine  100 mg Oral Daily   umeclidinium bromide  1 puff Inhalation Daily    Dialysis Orders: [facility] TTS 4h  400/1.5  68kg 2/2 bath  LFA AVF  Hep none   - mircera 24mg q2, last 4/13, was due 4/26  - venofer 50 q wk  - calcitriol 0.573m PO q HD   Assessment/Plan: Uremia - w/ azotemia, creat 28. Now improved. Missed HD x 2 wks. Pt is homeless, see below. Conjunctivitis - ABX eye drops ordered.  Homelessness - major issue w/ HD pt,  needs assistance with placement, SW/case manager aware - working on SNF placement. Housing resources previously provided to patient in outpatient HD center.  Fever: On admit, blood cultures NGTD. Afebrile. 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  Per their notes  "Given the age of the fistula I would only offer revision if there were scabbing or ulcerations overlying aneurysmal areas."  No indication to re-consult at this time.  HTN/volume:  BP in goal.  Continue home meds - amlodipine '5mg'$  qd, coreg 12.'5mg'$  BID. UF as tolerated. Atrial Fib - on Coreg. Eliquis has been on hold, possibly too high risk for bleed to resume which is appropriate. Secondary HPTH: Ca/Phos ok, continue home binders/VDRA.  Anemia of ESRD: Hgb stable, last 7.9. S/p 2U PRBCs this admit. Continue Aranesp 258mg q Thursday while here. S/p episode epistaxis this admit. Nutrition: Alb low, continue supplements Dispo- SW working on placement   LMicrosoft PA-C [employer]5/18/2023,10:58 AM  LOS: 14 days

## 2021-11-25 NOTE — TOC Progression Note (Signed)
Transition of Care [facility]) - Initial/Assessment Note    Patient Details  Name: Raymond [name] MRN: [id number] Date of Birth: 07-27-1955  Transition of [facility]) CM/SW Contact:    Milinda Antis, [place] Phone Number: 11/25/2021, 4:15 PM  Clinical Narrative:                 CSW met with the patient at bedside and gave the patient the information to [facility].  CSW encouraged the patient to call and speak with the leader to inquire about the patient's ability to go to discharge to the recovery house.  TOC will continue to follow.   Expected Discharge Plan: Skilled Nursing Facility Barriers to Discharge: Continued Medical Work up, Homeless with medical needs, Family Issues, No SNF bed, Insurance Authorization   Patient Goals and CMS Choice Patient states their goals for this hospitalization and ongoing recovery are:: To go to rehab CMS Medicare.gov Compare Post Acute Care list provided to:: Patient Choice offered to / list presented to : Patient  Expected Discharge Plan and Services Expected Discharge Plan: [place]       Living arrangements for the past 2 months: Homeless                                      Prior Living Arrangements/Services Living arrangements for the past 2 months: Homeless Lives with:: Self Patient language and need for interpreter reviewed:: Yes Do you feel safe going back to the place where you live?: No   Homeless  Need for Family Participation in Patient Care: Yes (Comment) Care giver support system in place?: No (comment)   Criminal Activity/Legal Involvement Pertinent to Current Situation/Hospitalization: No - Comment as needed  Activities of Daily Living Home Assistive Devices/Equipment: None ADL Screening (condition at time of admission) Patient's cognitive ability adequate to safely complete daily activities?: Yes Is the patient deaf or have difficulty hearing?: No Does the patient have difficulty  seeing, even when wearing glasses/contacts?: Yes Does the patient have difficulty concentrating, remembering, or making decisions?: No Patient able to express need for assistance with ADLs?: Yes Does the patient have difficulty dressing or bathing?: No Independently performs ADLs?: Yes (appropriate for developmental age) Does the patient have difficulty walking or climbing stairs?: No Weakness of Legs: Both Weakness of Arms/Hands: None  Permission Sought/Granted Permission sought to share information with : Other (comment) (CSW) Permission granted to share information with : Yes, Verbal Permission Granted     Permission granted to share info w AGENCY: SNFs or shelters        Emotional Assessment Appearance:: Appears older than stated age Attitude/Demeanor/Rapport: Engaged Affect (typically observed): Adaptable Orientation: : Oriented to Situation, Oriented to Place, Oriented to Self Alcohol / Substance Use: Illicit Drugs Psych Involvement: No (comment)  Admission diagnosis:  Hyperkalemia [E87.5] CHF (congestive heart failure) (HCC) [I50.9] Uremia [N19] Patient Active Problem List   Diagnosis Date Noted   Hypertension    Paroxysmal atrial fibrillation (HCC)    Substance abuse (HCC)    Thrombocytopenia ([place]) 09/15/2021   Hypertensive urgency 09/13/2021   Uncontrolled hypertension    Uremia    Severe episode of recurrent major depressive disorder, without psychotic features ([place])    ESRD (end stage renal disease) on dialysis ([place]) 05/18/2021   Cardiac arrest ([place]) 04/03/2021   Anisocoria 04/03/2021   Acute on chronic diastolic CHF (congestive heart failure) ([place])  04/03/2021   Hypothermia not due to cold exposure 04/03/2021   Junctional bradycardia 04/03/2021   Pancytopenia ([place]) 01/25/2021   AMS (altered mental status) 01/19/2021   Chronic viral hepatitis C ([place]) 12/25/2020   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 ([place]) 08/05/2020   Acute upper GI bleed  46/27/0350   Acute metabolic encephalopathy 09/38/1829   COVID-19 virus infection 07/29/2020   AVM (arteriovenous malformation) of stomach, acquired with hemorrhage    AVM (arteriovenous malformation) of small bowel, acquired with hemorrhage    Current use of long term anticoagulation    Sensorineural hearing loss (SNHL) of both ears 03/30/2020   History of arteriovenous malformation (AVM) 01/14/2020   UGIB (upper gastrointestinal bleed) 01/14/2020   Acute blood loss anemia 01/14/2020   Elevated troponin 01/14/2020   Allergy, unspecified, initial encounter 04/29/2019   Gastroenteritis 12/24/2018   Substance use disorder 12/24/2018   Type 2 diabetes mellitus with other diabetic kidney complication ([place]) 93/71/6967   Noncompliance of patient with renal dialysis 09/12/2018   Benign neoplasm of colon    Melena    Anemia    H/O medication noncompliance    Polysubstance (excluding opioids) dependence (HCC)    Atrial flutter with rapid ventricular response ([place]) 08/10/2018   Chest pain 08/10/2018   PAF (paroxysmal atrial fibrillation) ([place]) 08/01/2018   Acute respiratory failure with hypoxia ([place]) 07/13/2018   Non-compliance with renal dialysis 07/13/2018   PAD (peripheral artery disease) ([place]) 04/11/2018   Sepsis ([place]) 03/31/2018   Preop cardiovascular exam 03/29/2018   Idiopathic gout, unspecified site 12/13/2017   Other specified complication of vascular prosthetic devices, implants and grafts, initial encounter ([place]) 11/03/2017   Volume overload 08/07/2017   Dyspnea 06/20/2017   Acute bronchitis    COPD (chronic obstructive pulmonary disease) ([place])    ESRD on hemodialysis ([place])    Hypoxia 12/05/2016   Hyperkalemia 12/19/2015   Hypoglycemia 12/19/2015   Polysubstance abuse ([place]) 12/19/2015   Homeless 12/19/2015   Anemia associated with stage 5 chronic renal failure ([place]) 12/19/2015   BPH without urinary obstruction 10/29/2015   Erectile dysfunction 10/29/2015   Hematuria,  gross 10/29/2015   Hypercalcemia 11/07/2014   Pruritus, unspecified 10/17/2014   Coagulation defect, unspecified ([place]) 09/17/2014   Thrombocytopenia, unspecified ([place]) 10/17/2013   Iron deficiency anemia, unspecified 08/28/2013   Atherosclerosis of native arteries of extremity with intermittent claudication ([place]) 12/04/2012   ESRD (end stage renal disease) ([place]) 12/04/2012   Hypertensive chronic kidney disease with stage 5 chronic kidney disease or end stage renal disease ([place]) 11/20/2012   Secondary hyperparathyroidism of renal origin ([place]) 11/20/2012   HEPATITIS C 09/07/2006   HYPERCHOLESTEROLEMIA 09/07/2006   HYPERTENSION, BENIGN SYSTEMIC 09/07/2006   PCP:  [name], FNP Pharmacy:   [facility] [address] [place] [address] Phone: [phone] Fax: [place], [place] [address] Phone: [phone] Fax: [phone]     Social Determinants of Health (SDOH) Interventions    Readmission Risk Interventions    06/22/2020    3:40 PM  Readmission Risk Prevention Plan  Transportation Screening Complete  Medication Review ([place]) Complete  PCP or Specialist appointment within 3-5 days of discharge Complete  HRI or [place] Complete  SW Recovery Care/Counseling Consult Complete  [place] Patient Refused

## 2021-11-25 NOTE — Progress Notes (Signed)
PT Cancellation Note  Patient Details Name: Raymond [name] MRN: [id number] DOB: Sep 06, 1955   Cancelled Treatment:    Reason Eval/Treat Not Completed: Patient at procedure or test/unavailable Patient off unit at HD. Will re-attempt as schedule allows.   Latanja Lehenbauer A. Gilford Rile PT, DPT Acute Rehabilitation Services Pager [phone] Office [phone]    Linna Hoff 11/25/2021, 9:19 AM

## 2021-11-25 NOTE — Progress Notes (Signed)
PROGRESS NOTE    Raymond [name]  HYI:[id number] DOB: June 02, 1956 DOA: 11/11/2021 PCP: [name], FNP  66 yo homeless male with the past medical history of ESRD on HD, T/TH/Sat heart failure, COPD, paroxysmal atrial fibrillation who presented with dyspnea and chest pain. Apparently patient has missed 2 weeks of hemodialysis. Continue use crack cocaine. On the day of hospitalization he suffered acute chest pain that prompted him to go to urgent care.  In the ED he was volume overloaded, BUN was 178, creatinine was 28, potassium was 7.4, high-sensitivity troponin was 51, chest x-ray noted bilateral pulmonary vascular congestion. -Improved after hemodialysis -Now awaiting placement   Subjective: -Seen and examined this morning at dialysis.  Assessment and Plan:  Pulmonary edema, hyperkalemia ESRD (end stage renal disease) on dialysis ([place]) -Missed HD X 2 weeks  -Homeless TOC following, plan for SNF  -Now euvolemic, continue HD per renal, hyperkalemia noted on today's labs -Awaiting placement, no bed offers yet, remains medically stable  Acute on chronic diastolic CHF (congestive heart failure) (HCC) -Echocardiogram from 2022 with preserved LV systolic function, moderate TR -volume managed with hemodialysis  Acute metabolic encephalopathy Uremic encephalopathy. -Resolved with HD  COPD (chronic obstructive pulmonary disease) (HCC) Stable, no clinical signs of exacerbation  Paroxysmal atrial fibrillation ([place]) Continue with amiodarone Patient not on anticoagulation due to history of severe GI bleed.    Anemia of chronic disease -Transfused 2 units of PRBC this admission, continue weekly EPO -Iron stores adequate, may need to increase EPO dose -Hemoglobin 7.9  Hypertension -Stable, continue amlodipine.   Dyslipidemia, continue with rosuvastatin,.   Substance abuse ([place]) -Counseled  DVT prophylaxis: Lovenox Code Status: Full code Family Communication: Discussed patient  in detail, no family at bedside Disposition Plan: SNF when bed available, medically stable for DC  Consultants:  Nephrology  Procedures:   Antimicrobials:    Objective: Vitals:   11/25/21 1200 11/25/21 1221 11/25/21 1230 11/25/21 1301  BP:  108/60 110/71 139/84  Pulse:  67 67 82  Resp: 18   18  Temp:  98 F (36.7 C)  98 F (36.7 C)  TempSrc:      SpO2:    100%  Weight:   67.5 kg   Height:        Intake/Output Summary (Last 24 hours) at 11/25/2021 1316 Last data filed at 11/25/2021 1230 Gross per 24 hour  Intake 840 ml  Output 2362 ml  Net -1522 ml    Filed Weights   11/25/21 0354 11/25/21 0903 11/25/21 1230  Weight: 69.1 kg 70 kg 67.5 kg    Examination:  General exam: Chronically ill male sitting up in bed, AAOx3, no distress CVS: S1-S2, regular rate rhythm Lungs: Decreased breath sounds at the bases otherwise clear Abdomen: Soft, nontender, bowel sounds present Extremities: No edema, left arm AV fistula Skin: No rashes on exposed skin Psychiatry:  Mood & affect appropriate.     Data Reviewed:   CBC: Recent Labs  Lab 11/19/21 0946 11/20/21 0342 11/21/21 0333 11/23/21 0623  WBC 3.9* 4.1 3.9* 4.3  HGB 8.3* 7.5* 7.9* 7.9*  HCT 26.7* 25.0* 26.1* 25.5*  MCV 97.8 98.8 99.6 99.6  PLT 202 219 226 786    Basic Metabolic Panel: Recent Labs  Lab 11/20/21 0342 11/23/21 0623  NA 132* 136  K 5.1 5.5*  CL 94* 96*  CO2 30 32  GLUCOSE 94 82  BUN 32* 47*  CREATININE 7.51* 10.70*  CALCIUM 8.2* 8.8*    GFR:  Estimated Creatinine Clearance: 6.3 mL/min (A) (by C-G formula based on SCr of 10.7 mg/dL (H)). Liver Function Tests: No results for input(s): AST, ALT, ALKPHOS, BILITOT, PROT, ALBUMIN in the last 168 hours.  No results for input(s): LIPASE, AMYLASE in the last 168 hours. No results for input(s): AMMONIA in the last 168 hours. Coagulation Profile: No results for input(s): INR, PROTIME in the last 168 hours. Cardiac Enzymes: No results for  input(s): CKTOTAL, CKMB, CKMBINDEX, TROPONINI in the last 168 hours. BNP (last 3 results) No results for input(s): PROBNP in the last 8760 hours. HbA1C: No results for input(s): HGBA1C in the last 72 hours. CBG: No results for input(s): GLUCAP in the last 168 hours.  Lipid Profile: No results for input(s): CHOL, HDL, LDLCALC, TRIG, CHOLHDL, LDLDIRECT in the last 72 hours. Thyroid Function Tests: No results for input(s): TSH, T4TOTAL, FREET4, T3FREE, THYROIDAB in the last 72 hours. Anemia Panel: No results for input(s): VITAMINB12, FOLATE, FERRITIN, TIBC, IRON, RETICCTPCT in the last 72 hours. Urine analysis:    Component Value Date/Time   COLORURINE YELLOW 05/08/2010 [place] 05/08/2010 1335   LABSPEC 1.009 05/08/2010 1335   PHURINE 6.0 05/08/2010 1335   GLUCOSEU NEGATIVE 05/08/2010 1335   HGBUR NEGATIVE 05/08/2010 1335   BILIRUBINUR NEGATIVE 05/08/2010 1335   KETONESUR NEGATIVE 05/08/2010 1335   PROTEINUR 100 (A) 05/08/2010 1335   UROBILINOGEN 0.2 05/08/2010 1335   NITRITE NEGATIVE 05/08/2010 1335   LEUKOCYTESUR SMALL (A) 05/08/2010 1335   Sepsis Labs: '@LABRCNTIP'$ (procalcitonin:4,lacticidven:4)  ) No results found for this or any previous visit (from the past 240 hour(s)).    Radiology Studies: No results found.   Scheduled Meds:  sodium chloride   Intravenous Once   sodium chloride   Intravenous Once   sodium chloride   Intravenous Once   amiodarone  200 mg Oral Daily   amLODipine  5 mg Oral QHS   [START ON 11/26/2021] aspirin EC  81 mg Oral Daily   calcitRIOL  0.5 mcg Oral Q T,Th,Sa-HD   carvedilol  12.5 mg Oral BID   Chlorhexidine Gluconate Cloth  6 each Topical Q0600   cinacalcet  180 mg Oral Q T,Th,Sa-HD   darbepoetin (ARANESP) injection - DIALYSIS  200 mcg Intravenous Q Thu-HD   feeding supplement (NEPRO CARB STEADY)  237 mL Oral TID BM   fluticasone furoate-vilanterol  1 puff Inhalation Daily   folic acid  1 mg Oral Daily   heparin  5,000  Units Subcutaneous Q12H   multivitamin  1 tablet Oral QHS   pantoprazole  40 mg Oral BID AC   rosuvastatin  10 mg Oral Daily   sevelamer carbonate  4,000 mg Oral TID WC   sodium chloride flush  3 mL Intravenous Q12H   thiamine  100 mg Oral Daily   trimethoprim-polymyxin b  1 drop Right Eye Q4H   umeclidinium bromide  1 puff Inhalation Daily   Continuous Infusions:  sodium chloride      11/25/2021, 1:16 PM

## 2021-11-26 DIAGNOSIS — Z992 Dependence on renal dialysis: Secondary | ICD-10-CM | POA: Diagnosis not present

## 2021-11-26 DIAGNOSIS — N186 End stage renal disease: Secondary | ICD-10-CM | POA: Diagnosis not present

## 2021-11-26 MED ORDER — CHLORHEXIDINE GLUCONATE CLOTH 2 % EX PADS
6.0000 | MEDICATED_PAD | Freq: Every day | CUTANEOUS | Status: DC
  Administered 2021-11-28 – 2021-11-29 (×2): 6 via TOPICAL

## 2021-11-26 NOTE — Plan of Care (Signed)
  Problem: Education: Goal: Knowledge of General Education information will improve Description Including pain rating scale, medication(s)/side effects and non-pharmacologic comfort measures Outcome: Progressing   

## 2021-11-26 NOTE — Progress Notes (Signed)
PROGRESS NOTE  Raymond [name]  DOB: 1955-07-31  PCP: [name], FNP QQI:[id number]  DOA: 11/11/2021  LOS: 15 days  Hospital Day: 16  Brief narrative: Raymond [name] is a 66 y.o. male with PMH significant for ESRD-HD-MWF, DM2, HTN, CAD, PAD, CHF, COPD, A-fib, chronic anemia, substance abuse, chronic hepatitis C Patient presented to the ED on 5/4 with complaint of chest pain, dyspnea after missing his dialysis for 2 weeks.  In the ED, he was volume overloaded with creatinine as high as 28, potassium high at 7.4, chest x-ray with pulmonary vascular congestion He was emergently dialyzed with subsequent improvement in volume status and electrolyte levels. His hospitalization was prolonged because of difficulty in placement.   Subjective: Patient was seen and examined this morning.  Lying on bed.  Not in distress.  No new symptoms.  Had regular dialysis yesterday.  Principal Problem:   ESRD (end stage renal disease) on dialysis [facility]) Active Problems:   Acute on chronic diastolic CHF (congestive heart failure) (HCC)   Acute metabolic encephalopathy   COPD (chronic obstructive pulmonary disease) (HCC)   Hypertension   Paroxysmal atrial fibrillation (HCC)   Substance abuse (HCC)     Assessment and Plan: ESRD-HD-MWF Volume overload status Acute on chronic diastolic CHF Hyperkalemia -Presented with dyspnea, chest pain, volume overload status after missing dialysis for 2 weeks -Dialysis resumed.  Blood pressure, volume status, electrolyte levels improved. -Currently on MWF dialysis per routine. -Continue amlodipine for blood pressure.  CAD/PAD/HLD -Continue statin -Not on antiplatelet or anticoagulation because of history of severe GI bleeding  COPD -Respiratory status stable.   Paroxysmal atrial fibrillation -Continue with amiodarone -Not on anticoagulation due to history of severe GI bleed.     Anemia of chronic disease -Received 2 units of PRBC this admission,  continue weekly EPO -Iron stores adequate, may need to increase EPO dose -Hemoglobin stable between 7 and 8    Substance abuse -Counseled  Difficulty disposition -Case management involved  Goals of care   Code Status: Full Code    Mobility: Encourage ambulation  Skin assessment:     Nutritional status:  Body mass index is 22.63 kg/m.  Nutrition Problem: Increased nutrient needs Etiology: chronic illness Signs/Symptoms: estimated needs     Diet:  Diet Order             Diet renal with fluid restriction Fluid restriction: Other (see comments); Room service appropriate? Yes; Fluid consistency: Thin  Diet effective now                   DVT prophylaxis:  heparin injection 5,000 Units Start: 11/11/21 1245   Antimicrobials: None Fluid: None Consultants: Nephrology Family Communication: None at bedside  Status is: Inpatient  Continue in-hospital care because: Difficulty placement Level of care: Med-Surg   Dispo: The patient is from: Homeless              Anticipated d/c is to: Nursing facility              Patient currently is medically stable to d/c.   Difficult to place patient Yes     Infusions:   sodium chloride      Scheduled Meds:  sodium chloride   Intravenous Once   sodium chloride   Intravenous Once   sodium chloride   Intravenous Once   amiodarone  200 mg Oral Daily   amLODipine  5 mg Oral QHS   aspirin EC  81 mg Oral Daily  calcitRIOL  0.5 mcg Oral Q T,Th,Sa-HD   carvedilol  12.5 mg Oral BID   Chlorhexidine Gluconate Cloth  6 each Topical Q0600   cinacalcet  180 mg Oral Q T,Th,Sa-HD   darbepoetin (ARANESP) injection - DIALYSIS  200 mcg Intravenous Q Thu-HD   feeding supplement (NEPRO CARB STEADY)  237 mL Oral TID BM   fluticasone furoate-vilanterol  1 puff Inhalation Daily   folic acid  1 mg Oral Daily   heparin  5,000 Units Subcutaneous Q12H   multivitamin  1 tablet Oral QHS   pantoprazole  40 mg Oral BID AC   rosuvastatin  10  mg Oral Daily   sevelamer carbonate  4,000 mg Oral TID WC   sodium chloride flush  3 mL Intravenous Q12H   thiamine  100 mg Oral Daily   trimethoprim-polymyxin b  1 drop Right Eye Q4H   umeclidinium bromide  1 puff Inhalation Daily    PRN meds: sodium chloride, acetaminophen, docusate sodium, melatonin, ondansetron (ZOFRAN) IV, polyethylene glycol, sodium chloride flush   Antimicrobials: Anti-infectives (From admission, onward)    None       Objective: Vitals:   11/26/21 0735 11/26/21 0921  BP:  106/70  Pulse:  (!) 105  Resp:  19  Temp:  99 F (37.2 C)  SpO2: 98% 92%    Intake/Output Summary (Last 24 hours) at 11/26/2021 1110 Last data filed at 11/26/2021 0525 Gross per 24 hour  Intake 720 ml  Output 2362 ml  Net -1642 ml   Filed Weights   11/25/21 0903 11/25/21 1230 11/25/21 2117  Weight: 70 kg 67.5 kg 67.5 kg   Weight change: 0.9 kg Body mass index is 22.63 kg/m.   Physical Exam: General exam: Pleasant elderly African-American male.  Not in physical distress Skin: No rashes, lesions or ulcers. HEENT: Atraumatic, normocephalic, no obvious bleeding Lungs: Clear to auscultation bilaterally CVS: Regular rate and rhythm, no murmur GI/Abd soft, nontender, nondistended, bowel sound present CNS: Alert, awake, oriented x3 Psychiatry: Mood appropriate Extremities: No pedal edema, no calf tenderness  Data Review: I have personally reviewed the laboratory data and studies available.  F/u labs ordered Unresulted Labs (From admission, onward)     Start     Ordered   11/27/21 0500  CBC with Differential/Platelet  Tomorrow morning,   R       Question:  Specimen collection method  Answer:  Lab=Lab collect   11/26/21 0743   11/27/21 7062  Basic metabolic panel  Tomorrow morning,   R       Question:  Specimen collection method  Answer:  Lab=Lab collect   11/26/21 0743   Signed and Held  CBC  Once,   R       Question:  Specimen collection method  Answer:  Lab=Lab  collect   Signed and Held   Signed and Held  Renal function panel  Once,   R       Question:  Specimen collection method  Answer:  Lab=Lab collect   Signed and Held   Signed and Held  CBC  Once,   R       Question:  Specimen collection method  Answer:  Lab=Lab collect   Signed and Held   Signed and Held  Renal function panel  Once,   R       Question:  Specimen collection method  Answer:  Lab=Lab collect   Signed and Held   Signed and Held  CBC  Once,  R       Question:  Specimen collection method  Answer:  Lab=Lab collect   Signed and Held            Signed, Terrilee Croak, MD [facility] 11/26/2021

## 2021-11-26 NOTE — TOC Progression Note (Signed)
Transition of Care [facility]) - Initial/Assessment Note    Patient Details  Name: PANAGIOTIS OELKERS MRN: [id number] Date of Birth: [birth year]  Transition of Care [facility]) CM/SW Contact:    Milinda Antis, [place] Phone Number: 11/26/2021, 1:55 PM  Clinical Narrative:                 CSW met with the patient at bedside to inquire about the intake screening with [facility].  The patient informed CSW that he was accepted and that he was informed that he could come next week.  The patient asked CSW to call the recovery home to verify the date.  CSW spoke with the patient's social worker, Bing Quarry, at [facility], the patient's primary care provider.  The agency requested information about when the patient would discharge so that they patient can be seen by his PCP within 7 days of discharge.  CSW explained that there is not a d/c date at this time.  CSW contacted the administrator of [facility] and was informed that the patient was accepted and can possibly be accepted at the end of next week if a bed is available.    TOC will continue to follow.     Expected Discharge Plan: Skilled Nursing Facility Barriers to Discharge: Continued Medical Work up, Homeless with medical needs, Family Issues, No SNF bed, Insurance Authorization   Patient Goals and CMS Choice Patient states their goals for this hospitalization and ongoing recovery are:: To go to rehab CMS Medicare.gov Compare Post Acute Care list provided to:: Patient Choice offered to / list presented to : Patient  Expected Discharge Plan and Services Expected Discharge Plan: [place]       Living arrangements for the past 2 months: Homeless                                      Prior Living Arrangements/Services Living arrangements for the past 2 months: Homeless Lives with:: Self Patient language and need for interpreter reviewed:: Yes Do you feel safe going back to the  place where you live?: No   Homeless  Need for Family Participation in Patient Care: Yes (Comment) Care giver support system in place?: No (comment)   Criminal Activity/Legal Involvement Pertinent to Current Situation/Hospitalization: No - Comment as needed  Activities of Daily Living Home Assistive Devices/Equipment: None ADL Screening (condition at time of admission) Patient's cognitive ability adequate to safely complete daily activities?: Yes Is the patient deaf or have difficulty hearing?: No Does the patient have difficulty seeing, even when wearing glasses/contacts?: Yes Does the patient have difficulty concentrating, remembering, or making decisions?: No Patient able to express need for assistance with ADLs?: Yes Does the patient have difficulty dressing or bathing?: No Independently performs ADLs?: Yes (appropriate for developmental age) Does the patient have difficulty walking or climbing stairs?: No Weakness of Legs: Both Weakness of Arms/Hands: None  Permission Sought/Granted Permission sought to share information with : Other (comment) (CSW) Permission granted to share information with : Yes, Verbal Permission Granted     Permission granted to share info w AGENCY: SNFs or shelters        Emotional Assessment Appearance:: Appears older than stated age Attitude/Demeanor/Rapport: Engaged Affect (typically observed): Adaptable Orientation: : Oriented to Situation, Oriented to Place, Oriented to Self Alcohol / Substance Use: Illicit Drugs Psych Involvement: No (comment)  Admission  diagnosis:  Hyperkalemia [E87.5] CHF (congestive heart failure) ([place]) [I50.9] Uremia [N19] Patient Active Problem List   Diagnosis Date Noted   Hypertension    Paroxysmal atrial fibrillation (HCC)    Substance abuse (HCC)    Thrombocytopenia ([place]) 09/15/2021   Hypertensive urgency 09/13/2021   Uncontrolled hypertension    Uremia    Severe episode of recurrent major depressive  disorder, without psychotic features ([place])    ESRD (end stage renal disease) on dialysis ([place]) 05/18/2021   Cardiac arrest ([place]) 04/03/2021   Anisocoria 04/03/2021   Acute on chronic diastolic CHF (congestive heart failure) ([place]) 04/03/2021   Hypothermia not due to cold exposure 04/03/2021   Junctional bradycardia 04/03/2021   Pancytopenia ([place]) 01/25/2021   AMS (altered mental status) 01/19/2021   Chronic viral hepatitis C ([place]) 12/25/2020   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 ([place]) 08/05/2020   Acute upper GI bleed 48/25/0037   Acute metabolic encephalopathy 04/88/8916   COVID-19 virus infection 07/29/2020   AVM (arteriovenous malformation) of stomach, acquired with hemorrhage    AVM (arteriovenous malformation) of small bowel, acquired with hemorrhage    Current use of long term anticoagulation    Sensorineural hearing loss (SNHL) of both ears 03/30/2020   History of arteriovenous malformation (AVM) 01/14/2020   UGIB (upper gastrointestinal bleed) 01/14/2020   Acute blood loss anemia 01/14/2020   Elevated troponin 01/14/2020   Allergy, unspecified, initial encounter 04/29/2019   Gastroenteritis 12/24/2018   Substance use disorder 12/24/2018   Type 2 diabetes mellitus with other diabetic kidney complication ([place]) 94/50/3888   Noncompliance of patient with renal dialysis 09/12/2018   Benign neoplasm of colon    Melena    Anemia    H/O medication noncompliance    Polysubstance (excluding opioids) dependence ([place])    Atrial flutter with rapid ventricular response ([place]) 08/10/2018   Chest pain 08/10/2018   PAF (paroxysmal atrial fibrillation) ([place]) 08/01/2018   Acute respiratory failure with hypoxia ([place]) 07/13/2018   Non-compliance with renal dialysis 07/13/2018   PAD (peripheral artery disease) ([place]) 04/11/2018   Sepsis ([place]) 03/31/2018   Preop cardiovascular exam 03/29/2018   Idiopathic gout, unspecified site 12/13/2017   Other specified complication of  vascular prosthetic devices, implants and grafts, initial encounter ([place]) 11/03/2017   Volume overload 08/07/2017   Dyspnea 06/20/2017   Acute bronchitis    COPD (chronic obstructive pulmonary disease) ([place])    ESRD on hemodialysis ([place])    Hypoxia 12/05/2016   Hyperkalemia 12/19/2015   Hypoglycemia 12/19/2015   Polysubstance abuse ([place]) 12/19/2015   Homeless 12/19/2015   Anemia associated with stage 5 chronic renal failure ([place]) 12/19/2015   BPH without urinary obstruction 10/29/2015   Erectile dysfunction 10/29/2015   Hematuria, gross 10/29/2015   Hypercalcemia 11/07/2014   Pruritus, unspecified 10/17/2014   Coagulation defect, unspecified ([place]) 09/17/2014   Thrombocytopenia, unspecified ([place]) 10/17/2013   Iron deficiency anemia, unspecified 08/28/2013   Atherosclerosis of native arteries of extremity with intermittent claudication ([place]) 12/04/2012   ESRD (end stage renal disease) ([place]) 12/04/2012   Hypertensive chronic kidney disease with stage 5 chronic kidney disease or end stage renal disease ([place]) 11/20/2012   Secondary hyperparathyroidism of renal origin ([place]) 11/20/2012   HEPATITIS C 09/07/2006   HYPERCHOLESTEROLEMIA 09/07/2006   HYPERTENSION, BENIGN SYSTEMIC 09/07/2006   PCP:  [name], FNP Pharmacy:   [facility] [address] [place] [address] Phone: [phone] Fax: [place],  [place] [id number] Phone: [phone] Fax: [phone]     Social Determinants of Health (SDOH) Interventions    Readmission Risk Interventions    06/22/2020    3:40 PM  Readmission Risk Prevention Plan  Transportation Screening Complete  Medication Review ([place]) Complete  PCP or Specialist appointment within 3-5 days of discharge Complete  HRI or [place] Complete  SW Recovery  Care/Counseling Consult Complete  [place] Patient Refused

## 2021-11-26 NOTE — Progress Notes (Signed)
[facility] Progress Note   Subjective:   Seen in room. Feeling well, no concerns. Denies SOB, CP, palpitations, dizziness, abdominal pain and nausea.   Objective Vitals:   11/25/21 2117 11/26/21 0525 11/26/21 0735 11/26/21 0921  BP: 116/73 124/72  106/70  Pulse: 74 70  (!) 105  Resp: '18 18  19  '$ Temp: 98.1 F (36.7 C) 98.5 F (36.9 C)  99 F (37.2 C)  TempSrc: Oral Oral    SpO2: 97% 100% 98% 92%  Weight: 67.5 kg     Height:       Physical Exam General: WDWN male, alert and in NAD Heart: RRR, no murmurs, rubs or gallops Lungs: CTA bilaterally without wheezing, rhonchi or rales Abdomen: Soft, non-distended, non-tender, +BS Extremities: No edema b/l lower extremities Dialysis Access:  LUE AVF  +t/b  Additional Objective Labs: Basic Metabolic Panel: Recent Labs  Lab 11/20/21 0342 11/23/21 0623  NA 132* 136  K 5.1 5.5*  CL 94* 96*  CO2 30 32  GLUCOSE 94 82  BUN 32* 47*  CREATININE 7.51* 10.70*  CALCIUM 8.2* 8.8*   Liver Function Tests: No results for input(s): AST, ALT, ALKPHOS, BILITOT, PROT, ALBUMIN in the last 168 hours. No results for input(s): LIPASE, AMYLASE in the last 168 hours. CBC: Recent Labs  Lab 11/20/21 0342 11/21/21 0333 11/23/21 0623  WBC 4.1 3.9* 4.3  HGB 7.5* 7.9* 7.9*  HCT 25.0* 26.1* 25.5*  MCV 98.8 99.6 99.6  PLT 219 226 257   Blood Culture    Component Value Date/Time   SDES BLOOD RIGHT HAND 11/14/2021 1616   SDES BLOOD RIGHT WRIST 11/14/2021 1616   SPECREQUEST  11/14/2021 1616    BOTTLES DRAWN AEROBIC ONLY Blood Culture results may not be optimal due to an inadequate volume of blood received in culture bottles   SPECREQUEST  11/14/2021 1616    BOTTLES DRAWN AEROBIC AND ANAEROBIC Blood Culture results may not be optimal due to an inadequate volume of blood received in culture bottles   CULT  11/14/2021 1616    NO GROWTH 5 DAYS Performed at [facility], [place] [address] [place], [place] [address]    CULT   11/14/2021 1616    NO GROWTH 5 DAYS Performed at [facility] [address], [place], [place] [address]    REPTSTATUS 11/19/2021 FINAL 11/14/2021 1616   REPTSTATUS 11/19/2021 FINAL 11/14/2021 1616    Cardiac Enzymes: No results for input(s): CKTOTAL, CKMB, CKMBINDEX, TROPONINI in the last 168 hours. CBG: No results for input(s): GLUCAP in the last 168 hours. Iron Studies: No results for input(s): IRON, TIBC, TRANSFERRIN, FERRITIN in the last 72 hours. '@lablastinr3'$ @ Studies/Results: No results found. Medications:  sodium chloride      sodium chloride   Intravenous Once   sodium chloride   Intravenous Once   sodium chloride   Intravenous Once   amiodarone  200 mg Oral Daily   amLODipine  5 mg Oral QHS   aspirin EC  81 mg Oral Daily   calcitRIOL  0.5 mcg Oral Q T,Th,Sa-HD   carvedilol  12.5 mg Oral BID   Chlorhexidine Gluconate Cloth  6 each Topical Q0600   cinacalcet  180 mg Oral Q T,Th,Sa-HD   darbepoetin (ARANESP) injection - DIALYSIS  200 mcg Intravenous Q Thu-HD   feeding supplement (NEPRO CARB STEADY)  237 mL Oral TID BM   fluticasone furoate-vilanterol  1 puff Inhalation Daily   folic acid  1 mg Oral Daily  heparin  5,000 Units Subcutaneous Q12H   multivitamin  1 tablet Oral QHS   pantoprazole  40 mg Oral BID AC   rosuvastatin  10 mg Oral Daily   sevelamer carbonate  4,000 mg Oral TID WC   sodium chloride flush  3 mL Intravenous Q12H   thiamine  100 mg Oral Daily   trimethoprim-polymyxin b  1 drop Right Eye Q4H   umeclidinium bromide  1 puff Inhalation Daily    Dialysis Orders: [facility] TTS 4h  400/1.5  68kg 2/2 bath  LFA AVF  Hep none   - mircera 216mg q2, last 4/13, was due 4/26  - venofer 50 q wk  - calcitriol 0.591m PO q HD  Assessment/Plan: Uremia - w/ azotemia, creat 28. Now improved. Missed HD x 2 wks. Pt is homeless, see below. Conjunctivitis - ABX eye drops ordered.  Homelessness - major issue w/ HD pt,  needs assistance with placement, SW/case  manager aware - working on SNF placement. Housing resources previously provided to patient in outpatient HD center.  Fever: On admit, blood cultures NGTD. Afebrile. ESRD - on HD TTS, next HD tomorrow, 5/20.  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  Per their notes  "Given the age of the fistula I would only offer revision if there were scabbing or ulcerations overlying aneurysmal areas."  No indication to re-consult at this time.  HTN/volume:  BP in goal.  Continue home meds - amlodipine '5mg'$  qd, coreg 12.'5mg'$  BID. UF as tolerated. Atrial Fib - on Coreg. Eliquis has been on hold, possibly too high risk for bleed to resume which is appropriate. Secondary HPTH: Ca/Phos ok, continue home binders/VDRA.  Anemia of ESRD: Hgb stable, last 7.9. S/p 2U PRBCs this admit. Continue Aranesp [id number] Thursday while here. S/p episode epistaxis this admit. Nutrition: Alb low, continue supplements Dispo- SW working on placement   SamMurphy OilA-C 11/26/2021, 9:53 AM  [facility] Pager: [phone]

## 2021-11-26 NOTE — Progress Notes (Signed)
Physical Therapy Treatment Patient Details Name: Raymond [name] MRN: [id number] DOB: [birth year] Today's Date: 11/26/2021   History of Present Illness 66 yo male presented to ED on 11/11/21 with dyspnea and chest pain. Apparently patient has missed 2 weeks of hemodialysis. Patient underwent emergent hemodialysis with ultrafiltration 3,5 L with improvement in volume status but not back to baseline; working diagnosis of volume overload in the setting of ESRD. PMH: ESRD on HDU TTS, COPD, hep C, HTN, CAD, PAD, COPD, crack cocaine dependence, A-fib, and medical noncompliance, hx of PEA arrest    PT Comments    Patient continues to be limited by L LE pain during weightbearing. Patient also complaining of L LE pain with calf squeeze and describes as sharp. Patient with heavy reliance on RW for support and distance limited by pain and discomfort. Notified Dr. Pola Corn about concern for LLE pain as it seems to present as vascular origin but will wait to see further testing. Updated d/c recommendation to OPPT if patient able to attend.     Recommendations for follow up therapy are one component of a multi-disciplinary discharge planning process, led by the attending physician.  Recommendations may be updated based on patient status, additional functional criteria and insurance authorization.  Follow Up Recommendations  Outpatient PT     Assistance Recommended at Discharge Intermittent Supervision/Assistance  Patient can return home with the following     Equipment Recommendations  Rollator (4 wheels)    Recommendations for Other Services       Precautions / Restrictions Precautions Precautions: Fall Restrictions Weight Bearing Restrictions: No     Mobility  Bed Mobility Overal bed mobility: Modified Independent                  Transfers Overall transfer level: Modified independent Equipment used: Rolling Matyas Baisley (2 wheels)                    Ambulation/Gait Ambulation/Gait  assistance: Supervision Gait Distance (Feet): 200 Feet Assistive device: Rolling Areta Terwilliger (2 wheels) Gait Pattern/deviations: Step-through pattern, Decreased stride length, Decreased stance time - left Gait velocity: decreased     General Gait Details: decreased stance time on L due to pain. Distance limited by pain this session   Stairs             Wheelchair Mobility    Modified Rankin (Stroke Patients Only)       Balance Overall balance assessment: Needs assistance   Sitting balance-Leahy Scale: Normal     Standing balance support: Bilateral upper extremity supported, During functional activity Standing balance-Leahy Scale: Fair                              Cognition Arousal/Alertness: Awake/alert Behavior During Therapy: WFL for tasks assessed/performed Overall Cognitive Status: Within Functional Limits for tasks assessed                                          Exercises      General Comments        Pertinent Vitals/Pain Pain Assessment Pain Assessment: Faces Faces Pain Scale: Hurts little more Pain Location: LLE with weight bearing Pain Descriptors / Indicators: Grimacing, Guarding, Sharp, Shooting Pain Intervention(s): Monitored during session, Repositioned    Home Living  Prior Function            PT Goals (current goals can now be found in the care plan section) Acute Rehab PT Goals PT Goal Formulation: With patient Time For Goal Achievement: 12/01/21 Potential to Achieve Goals: Good Progress towards PT goals: Progressing toward goals    Frequency    Min 2X/week      PT Plan Discharge plan needs to be updated    Co-evaluation              AM-PAC PT "6 Clicks" Mobility   Outcome Measure  Help needed turning from your back to your side while in a flat bed without using bedrails?: None Help needed moving from lying on your back to sitting on the side of a  flat bed without using bedrails?: None Help needed moving to and from a bed to a chair (including a wheelchair)?: None Help needed standing up from a chair using your arms (e.g., wheelchair or bedside chair)?: None Help needed to walk in hospital room?: A Little Help needed climbing 3-5 steps with a railing? : A Little 6 Click Score: 22    End of Session   Activity Tolerance: Patient tolerated treatment well Patient left: with call bell/phone within reach;in bed Nurse Communication: Mobility status PT Visit Diagnosis: Unsteadiness on feet (R26.81);Other abnormalities of gait and mobility (R26.89);Pain;Muscle weakness (generalized) (M62.81) Pain - Right/Left: Left Pain - part of body: Leg     Time: 1550-1559 PT Time Calculation (min) (ACUTE ONLY): 9 min  Charges:  $Gait Training: 8-22 mins                     Avrie Kedzierski A. Dan Humphreys PT, DPT Acute Rehabilitation Services Pager [phone] Office [phone]    Viviann Spare 11/26/2021, 5:20 PM

## 2021-11-26 NOTE — Progress Notes (Addendum)
Occupational Therapy Treatment and Discharge Patient Details Name: Raymond [name] MRN: [id number] DOB: 10/23/1955 Today's Date: 11/26/2021   History of present illness 66 yo male presented to ED on 11/11/21 with dyspnea and chest pain. Apparently patient has missed 2 weeks of hemodialysis. Patient underwent emergent hemodialysis with ultrafiltration 3,5 L with improvement in volume status but not back to baseline; working diagnosis of volume overload in the setting of ESRD. PMH: ESRD on HDU TTS, COPD, hep C, HTN, CAD, PAD, COPD, crack cocaine dependence, A-fib, and medical noncompliance, hx of PEA arrest   OT comments  Pt is functioning modified independently with RW in ADLs and mobility. Pt placing minimal weight on L LE while shaving at sink. Pt with reliance on DME with ambulation due to L LE pain. PT aware and to assess LE pain further during session later today. Pt may benefit from shower seat, but wants to wait until he sees discharge environment prior to ordering. All goals are met, no further OT needs.    Recommendations for follow up therapy are one component of a multi-disciplinary discharge planning process, led by the attending physician.  Recommendations may be updated based on patient status, additional functional criteria and insurance authorization.    Follow Up Recommendations  No OT follow up    Assistance Recommended at Discharge PRN  Patient can return home with the following  Help with stairs or ramp for entrance;Assist for transportation   Equipment Recommendations  None recommended by OT    Recommendations for Other Services      Precautions / Restrictions Precautions Precautions: Fall (minimal) Restrictions Weight Bearing Restrictions: No       Mobility Bed Mobility Overal bed mobility: Modified Independent                  Transfers Overall transfer level: Modified independent Equipment used: Rolling walker (2 wheels)                      Balance Overall balance assessment: Needs assistance   Sitting balance-Leahy Scale: Normal     Standing balance support: No upper extremity supported, During functional activity Standing balance-Leahy Scale: Fair Standing balance comment: at sink                           ADL either performed or assessed with clinical judgement   ADL Overall ADL's : Modified independent                                     Functional mobility during ADLs: Modified independent;Rolling walker (2 wheels)      Extremity/Trunk Assessment              Vision       Perception     Praxis      Cognition Arousal/Alertness: Awake/alert Behavior During Therapy: WFL for tasks assessed/performed Overall Cognitive Status: Within Functional Limits for tasks assessed                                          Exercises      Shoulder Instructions       General Comments      Pertinent Vitals/ Pain       Pain Assessment Pain Assessment: Faces Faces  Pain Scale: Hurts little more Pain Location: LLE with weight bearing Pain Descriptors / Indicators: Grimacing, Guarding Pain Intervention(s): Repositioned  Home Living                                          Prior Functioning/Environment              Frequency           Progress Toward Goals  OT Goals(current goals can now be found in the care plan section)  Progress towards OT goals: Goals met/education completed, patient discharged from [facility] Discharge plan needs to be updated    Co-evaluation                 AM-PAC OT "6 Clicks" Daily Activity     Outcome Measure   Help from another person eating meals?: None Help from another person taking care of personal grooming?: None Help from another person toileting, which includes using toliet, bedpan, or urinal?: None Help from another person bathing (including washing, rinsing, drying)?: None Help  from another person to put on and taking off regular upper body clothing?: None Help from another person to put on and taking off regular lower body clothing?: None 6 Click Score: 24    End of Session Equipment Utilized During Treatment: Rolling walker (2 wheels)  OT Visit Diagnosis: Other abnormalities of gait and mobility (R26.89);Pain   Activity Tolerance Patient tolerated treatment well;Patient limited by pain   Patient Left in bed;with call bell/phone within reach   Nurse Communication          Time: 4370-0525 OT Time Calculation (min): 34 min  Charges: OT General Charges $OT Visit: 1 Visit OT Treatments $Self Care/Home Management : 23-37 mins  Raymond [name], OTR/L Acute Rehabilitation Services Pager: [phone] Office: [phone]   Raymond [name] 11/26/2021, 3:44 PM

## 2021-11-27 ENCOUNTER — Encounter (HOSPITAL_COMMUNITY): Payer: 59

## 2021-11-27 DIAGNOSIS — Z992 Dependence on renal dialysis: Secondary | ICD-10-CM | POA: Diagnosis not present

## 2021-11-27 DIAGNOSIS — N186 End stage renal disease: Secondary | ICD-10-CM | POA: Diagnosis not present

## 2021-11-27 LAB — CBC WITH DIFFERENTIAL/PLATELET
Abs Immature Granulocytes: 0.01 10*3/uL (ref 0.00–0.07)
Basophils Absolute: 0 10*3/uL (ref 0.0–0.1)
Basophils Relative: 1 %
Eosinophils Absolute: 0.1 10*3/uL (ref 0.0–0.5)
Eosinophils Relative: 1 %
HCT: 24.8 % — ABNORMAL LOW (ref 39.0–52.0)
Hemoglobin: 7.6 g/dL — ABNORMAL LOW (ref 13.0–17.0)
Immature Granulocytes: 0 %
Lymphocytes Relative: 32 %
Lymphs Abs: 1.1 10*3/uL (ref 0.7–4.0)
MCH: 30.3 pg (ref 26.0–34.0)
MCHC: 30.6 g/dL (ref 30.0–36.0)
MCV: 98.8 fL (ref 80.0–100.0)
Monocytes Absolute: 0.8 10*3/uL (ref 0.1–1.0)
Monocytes Relative: 21 %
Neutro Abs: 1.6 10*3/uL — ABNORMAL LOW (ref 1.7–7.7)
Neutrophils Relative %: 45 %
Platelets: 197 10*3/uL (ref 150–400)
RBC: 2.51 MIL/uL — ABNORMAL LOW (ref 4.22–5.81)
RDW: 17.6 % — ABNORMAL HIGH (ref 11.5–15.5)
WBC: 3.6 10*3/uL — ABNORMAL LOW (ref 4.0–10.5)
nRBC: 0 % (ref 0.0–0.2)

## 2021-11-27 LAB — BASIC METABOLIC PANEL
Anion gap: 8 (ref 5–15)
BUN: 42 mg/dL — ABNORMAL HIGH (ref 8–23)
CO2: 30 mmol/L (ref 22–32)
Calcium: 9 mg/dL (ref 8.9–10.3)
Chloride: 98 mmol/L (ref 98–111)
Creatinine, Ser: 8.57 mg/dL — ABNORMAL HIGH (ref 0.61–1.24)
GFR, Estimated: 6 mL/min — ABNORMAL LOW (ref >60)
Glucose, Bld: 80 mg/dL (ref 70–99)
Potassium: 4.6 mmol/L (ref 3.5–5.1)
Sodium: 136 mmol/L (ref 135–145)

## 2021-11-27 NOTE — Progress Notes (Signed)
PROGRESS NOTE  Raymond [name]  DOB: 1956/05/28  PCP: [name], FNP LZJ:[id number]  DOA: 11/11/2021  LOS: 71 days  Hospital Day: 17  Brief narrative: Raymond [name] is a 66 y.o. male with PMH significant for ESRD-HD-MWF, DM2, HTN, CAD, PAD, CHF, COPD, A-fib, chronic anemia, substance abuse, chronic hepatitis C Patient presented to the ED on 5/4 with complaint of chest pain, dyspnea after missing his dialysis for 2 weeks.  In the ED, he was volume overloaded with creatinine as high as 28, potassium high at 7.4, chest x-ray with pulmonary vascular congestion He was emergently dialyzed with subsequent improvement in volume status and electrolyte levels. His hospitalization was prolonged because of difficulty in placement.   Subjective: Patient was seen and examined this morning.  Not in distress pulmonary symptoms.  Pending ultrasound duplex of lower extremities today.  Principal Problem:   ESRD (end stage renal disease) on dialysis [facility]) Active Problems:   Acute on chronic diastolic CHF (congestive heart failure) (HCC)   Acute metabolic encephalopathy   COPD (chronic obstructive pulmonary disease) (HCC)   Hypertension   Paroxysmal atrial fibrillation (HCC)   Substance abuse (HCC)     Assessment and Plan: ESRD-HD-MWF Volume overload status Acute on chronic diastolic CHF Hyperkalemia -Presented with dyspnea, chest pain, volume overload status after missing dialysis for 2 weeks -Dialysis resumed.  Blood pressure, volume status, electrolyte levels improved. -Currently on MWF dialysis per routine. -Continue amlodipine for blood pressure.  CAD/PAD/HLD -Continue statin -Not on antiplatelet or anticoagulation because of history of severe GI bleeding  COPD -Respiratory status stable.   Paroxysmal atrial fibrillation -Continue with amiodarone -Not on anticoagulation due to history of severe GI bleed.     Anemia of chronic disease -Received 2 units of PRBC this  admission, continue weekly EPO -Iron stores adequate, may need to increase EPO dose -Hemoglobin stable between 7 and 8 Recent Labs    10/14/21 1804 11/11/21 1034 11/15/21 0456 11/16/21 0725 11/17/21 0350 11/19/21 0946 11/20/21 0342 11/21/21 0333 11/23/21 0623 11/27/21 0410  HGB 8.4* 8.1* 6.7* 6.9* 9.0* 8.3* 7.5* 7.9* 7.9* 7.6*      Substance abuse -Counseled  Difficulty disposition -Case management involved  Bilateral lower extremity calf pain -Obtain DVT scan of both lower extremities   Goals of care   Code Status: Full Code    Mobility: Encourage ambulation  Skin assessment:     Nutritional status:  Body mass index is 23.5 kg/m.  Nutrition Problem: Increased nutrient needs Etiology: chronic illness Signs/Symptoms: estimated needs     Diet:  Diet Order             Diet renal with fluid restriction Fluid restriction: Other (see comments); Room service appropriate? Yes; Fluid consistency: Thin  Diet effective now                   DVT prophylaxis:  heparin injection 5,000 Units Start: 11/11/21 1245   Antimicrobials: None Fluid: None Consultants: Nephrology Family Communication: None at bedside  Status is: Inpatient  Continue in-hospital care because: Difficulty placement Level of care: Med-Surg   Dispo: The patient is from: Homeless              Anticipated d/c is to: Nursing facility              Patient currently is medically stable to d/c.   Difficult to place patient Yes     Infusions:   sodium chloride  Scheduled Meds:  sodium chloride   Intravenous Once   sodium chloride   Intravenous Once   sodium chloride   Intravenous Once   amiodarone  200 mg Oral Daily   amLODipine  5 mg Oral QHS   aspirin EC  81 mg Oral Daily   calcitRIOL  0.5 mcg Oral Q T,Th,Sa-HD   carvedilol  12.5 mg Oral BID   Chlorhexidine Gluconate Cloth  6 each Topical Q0600   cinacalcet  180 mg Oral Q T,Th,Sa-HD   darbepoetin (ARANESP) injection -  DIALYSIS  200 mcg Intravenous Q Thu-HD   feeding supplement (NEPRO CARB STEADY)  237 mL Oral TID BM   fluticasone furoate-vilanterol  1 puff Inhalation Daily   folic acid  1 mg Oral Daily   heparin  5,000 Units Subcutaneous Q12H   multivitamin  1 tablet Oral QHS   pantoprazole  40 mg Oral BID AC   rosuvastatin  10 mg Oral Daily   sevelamer carbonate  4,000 mg Oral TID WC   sodium chloride flush  3 mL Intravenous Q12H   thiamine  100 mg Oral Daily   trimethoprim-polymyxin b  1 drop Right Eye Q4H   umeclidinium bromide  1 puff Inhalation Daily    PRN meds: sodium chloride, acetaminophen, docusate sodium, melatonin, ondansetron (ZOFRAN) IV, polyethylene glycol, sodium chloride flush   Antimicrobials: Anti-infectives (From admission, onward)    None       Objective: Vitals:   11/27/21 1030 11/27/21 1100  BP: (!) 98/54 121/74  Pulse: 68 73  Resp: 14 16  Temp:    SpO2:      Intake/Output Summary (Last 24 hours) at 11/27/2021 1157 Last data filed at 11/27/2021 0800 Gross per 24 hour  Intake 960 ml  Output 0 ml  Net 960 ml    Filed Weights   11/25/21 2117 11/27/21 0500 11/27/21 0830  Weight: 67.5 kg 71.7 kg 70.1 kg   Weight change: 1.7 kg Body mass index is 23.5 kg/m.   Physical Exam: General exam: Pleasant elderly African-American male.  Not in physical distress Skin: No rashes, lesions or ulcers. HEENT: Atraumatic, normocephalic, no obvious bleeding Lungs: Clear to auscultation bilaterally CVS: Regular rate and rhythm, no murmur GI/Abd soft, nontender, nondistended, bowel sound present CNS: Alert, awake, oriented x3 Psychiatry: Mood appropriate Extremities: No pedal edema,  Data Review: I have personally reviewed the laboratory data and studies available.  F/u labs ordered [employer] (From admission, onward)     Start     Ordered   Signed and Held  CBC  Once,   R       Question:  Specimen collection method  Answer:  Lab=Lab collect   Signed and Held    Signed and Held  Renal function panel  Once,   R       Question:  Specimen collection method  Answer:  Lab=Lab collect   Signed and Held   Signed and Held  CBC  Once,   R       Question:  Specimen collection method  Answer:  Lab=Lab collect   Signed and Held   Signed and Held  Renal function panel  Once,   R       Question:  Specimen collection method  Answer:  Lab=Lab collect   Signed and Held   Signed and Held  CBC  Once,   R       Question:  Specimen collection method  Answer:  Lab=Lab collect   Signed  and Held            Signed, Terrilee Croak, MD [facility] 11/27/2021

## 2021-11-27 NOTE — Progress Notes (Signed)
[facility] Progress Note   Subjective:   No concerns today. Denies SOB, CP, dizziness, abdominal pain and nausea.   Objective Vitals:   11/26/21 1626 11/26/21 2109 11/27/21 0500 11/27/21 0503  BP: (!) 142/84 126/60  127/76  Pulse: 69 70  68  Resp: '19 18  17  '$ Temp: 98 F (36.7 C) 98.2 F (36.8 C)  97.9 F (36.6 C)  TempSrc:      SpO2: 100% 100%  100%  Weight:   71.7 kg   Height:       Physical Exam General: WDWN male, alert and in NAD Heart: RRR, no murmurs, rubs or gallops Lungs: CTA bilaterally without wheezing, rhonchi or rales Abdomen: Soft, non-distended, non-tender, +BS Extremities: No edema b/l lower extremities Dialysis Access:  LUE AVF  +t/b  Additional Objective Labs: Basic Metabolic Panel: Recent Labs  Lab 11/23/21 0623 11/27/21 0410  NA 136 136  K 5.5* 4.6  CL 96* 98  CO2 32 30  GLUCOSE 82 80  BUN 47* 42*  CREATININE 10.70* 8.57*  CALCIUM 8.8* 9.0   Liver Function Tests: No results for input(s): AST, ALT, ALKPHOS, BILITOT, PROT, ALBUMIN in the last 168 hours. No results for input(s): LIPASE, AMYLASE in the last 168 hours. CBC: Recent Labs  Lab 11/21/21 0333 11/23/21 0623 11/27/21 0410  WBC 3.9* 4.3 3.6*  NEUTROABS  --   --  1.6*  HGB 7.9* 7.9* 7.6*  HCT 26.1* 25.5* 24.8*  MCV 99.6 99.6 98.8  PLT 226 257 197   Blood Culture    Component Value Date/Time   SDES BLOOD RIGHT HAND 11/14/2021 1616   SDES BLOOD RIGHT WRIST 11/14/2021 1616   SPECREQUEST  11/14/2021 1616    BOTTLES DRAWN AEROBIC ONLY Blood Culture results may not be optimal due to an inadequate volume of blood received in culture bottles   SPECREQUEST  11/14/2021 1616    BOTTLES DRAWN AEROBIC AND ANAEROBIC Blood Culture results may not be optimal due to an inadequate volume of blood received in culture bottles   CULT  11/14/2021 1616    NO GROWTH 5 DAYS Performed at [facility], [place] [address] [place], [place] [address]    CULT  11/14/2021 1616    NO  GROWTH 5 DAYS Performed at [facility] [address] [place], [place] [address]    REPTSTATUS 11/19/2021 FINAL 11/14/2021 1616   REPTSTATUS 11/19/2021 FINAL 11/14/2021 1616    Cardiac Enzymes: No results for input(s): CKTOTAL, CKMB, CKMBINDEX, TROPONINI in the last 168 hours. CBG: No results for input(s): GLUCAP in the last 168 hours. Iron Studies: No results for input(s): IRON, TIBC, TRANSFERRIN, FERRITIN in the last 72 hours. '@lablastinr3'$ @ Studies/Results: No results found. Medications:  sodium chloride      sodium chloride   Intravenous Once   sodium chloride   Intravenous Once   sodium chloride   Intravenous Once   amiodarone  200 mg Oral Daily   amLODipine  5 mg Oral QHS   aspirin EC  81 mg Oral Daily   calcitRIOL  0.5 mcg Oral Q T,Th,Sa-HD   carvedilol  12.5 mg Oral BID   Chlorhexidine Gluconate Cloth  6 each Topical Q0600   cinacalcet  180 mg Oral Q T,Th,Sa-HD   darbepoetin (ARANESP) injection - DIALYSIS  200 mcg Intravenous Q Thu-HD   feeding supplement (NEPRO CARB STEADY)  237 mL Oral TID BM   fluticasone furoate-vilanterol  1 puff Inhalation Daily   folic acid  1 mg Oral Daily   heparin  5,000 Units Subcutaneous Q12H   multivitamin  1 tablet Oral QHS   pantoprazole  40 mg Oral BID AC   rosuvastatin  10 mg Oral Daily   sevelamer carbonate  4,000 mg Oral TID WC   sodium chloride flush  3 mL Intravenous Q12H   thiamine  100 mg Oral Daily   trimethoprim-polymyxin b  1 drop Right Eye Q4H   umeclidinium bromide  1 puff Inhalation Daily    Dialysis Orders: [facility] TTS 4h  400/1.5  68kg 2/2 bath  LFA AVF  Hep none   - mircera 282mg q2, last 4/13, was due 4/26  - venofer 50 q wk  - calcitriol 0.542m PO q HD    Assessment/Plan: Uremia - w/ azotemia, creat 28. Now improved. Missed HD x 2 wks. Pt is homeless, see below. Conjunctivitis - ABX eye drops ordered.  Homelessness - major issue w/ HD pt,  needs assistance with placement, SW/case manager aware -  working on SNF placement. Housing resources previously provided to patient in outpatient HD center.  Fever: On admit, blood cultures NGTD. Afebrile. 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  Per their notes  "Given the age of the fistula I would only offer revision if there were scabbing or ulcerations overlying aneurysmal areas."  No indication to re-consult at this time.  HTN/volume:  BP in goal.  Continue home meds - amlodipine '5mg'$  qd, coreg 12.'5mg'$  BID. UF as tolerated. Atrial Fib - on Coreg. Eliquis has been on hold, possibly too high risk for bleed to resume which is appropriate. Secondary HPTH: Ca/Phos ok, continue home binders/VDRA.  Anemia of ESRD: Hgb stable, last 7.6. S/p 2U PRBCs this admit. Continue Aranesp 20081mq Thursday while here. S/p episode epistaxis this admit. Nutrition: Alb low, continue supplements Dispo- SW working on placement   SamMurphy OilA-C 11/27/2021, 8:14 AM  [employer]: [phone]

## 2021-11-28 ENCOUNTER — Inpatient Hospital Stay (HOSPITAL_COMMUNITY): Payer: 59

## 2021-11-28 DIAGNOSIS — N186 End stage renal disease: Secondary | ICD-10-CM | POA: Diagnosis not present

## 2021-11-28 DIAGNOSIS — M7989 Other specified soft tissue disorders: Secondary | ICD-10-CM

## 2021-11-28 DIAGNOSIS — Z992 Dependence on renal dialysis: Secondary | ICD-10-CM | POA: Diagnosis not present

## 2021-11-28 NOTE — Progress Notes (Signed)
[facility] Progress Note   Subjective:   Pt seen in room. Reports he was nauseated during HD yesterday, thinks they may have removed too much fluid. Denies SOB, CP, dizziness, abdominal pain and nausea at present.   Objective Vitals:   11/27/21 1344 11/27/21 1640 11/27/21 2102 11/28/21 0626  BP: 111/63 117/68 103/73 100/65  Pulse: 70 69 67 70  Resp:    17  Temp: 98.9 F (37.2 C) 98.3 F (36.8 C) 98.1 F (36.7 C) 98.4 F (36.9 C)  TempSrc: Oral Oral Oral Oral  SpO2: 99% 98% 98% 100%  Weight:   68.2 kg   Height:       Physical Exam General: WDWN male, alert and in NAD Heart: RRR, no murmurs, rubs or gallops Lungs: CTA bilaterally without wheezing, rhonchi or rales Abdomen: Soft, non-distended, non-tender, +BS Extremities: No edema b/l lower extremities Dialysis Access:  LUE AVF  +t/b  Additional Objective Labs: Basic Metabolic Panel: Recent Labs  Lab 11/23/21 0623 11/27/21 0410  NA 136 136  K 5.5* 4.6  CL 96* 98  CO2 32 30  GLUCOSE 82 80  BUN 47* 42*  CREATININE 10.70* 8.57*  CALCIUM 8.8* 9.0   CBC: Recent Labs  Lab 11/23/21 0623 11/27/21 0410  WBC 4.3 3.6*  NEUTROABS  --  1.6*  HGB 7.9* 7.6*  HCT 25.5* 24.8*  MCV 99.6 98.8  PLT 257 197   Blood Culture    Component Value Date/Time   SDES BLOOD RIGHT HAND 11/14/2021 1616   SDES BLOOD RIGHT WRIST 11/14/2021 1616   SPECREQUEST  11/14/2021 1616    BOTTLES DRAWN AEROBIC ONLY Blood Culture results may not be optimal due to an inadequate volume of blood received in culture bottles   SPECREQUEST  11/14/2021 1616    BOTTLES DRAWN AEROBIC AND ANAEROBIC Blood Culture results may not be optimal due to an inadequate volume of blood received in culture bottles   CULT  11/14/2021 1616    NO GROWTH 5 DAYS Performed at [facility], [address] [place], [place] [address]    CULT  11/14/2021 1616    NO GROWTH 5 DAYS Performed at [facility] [address] [place],   [address]    REPTSTATUS 11/19/2021 FINAL 11/14/2021 1616   REPTSTATUS 11/19/2021 FINAL 11/14/2021 1616     Medications:  sodium chloride      sodium chloride   Intravenous Once   sodium chloride   Intravenous Once   sodium chloride   Intravenous Once   amiodarone  200 mg Oral Daily   amLODipine  5 mg Oral QHS   aspirin EC  81 mg Oral Daily   calcitRIOL  0.5 mcg Oral Q T,Th,Sa-HD   carvedilol  12.5 mg Oral BID   Chlorhexidine Gluconate Cloth  6 each Topical Q0600   cinacalcet  180 mg Oral Q T,Th,Sa-HD   darbepoetin (ARANESP) injection - DIALYSIS  200 mcg Intravenous Q Thu-HD   feeding supplement (NEPRO CARB STEADY)  237 mL Oral TID BM   fluticasone furoate-vilanterol  1 puff Inhalation Daily   folic acid  1 mg Oral Daily   heparin  5,000 Units Subcutaneous Q12H   multivitamin  1 tablet Oral QHS   pantoprazole  40 mg Oral BID AC   rosuvastatin  10 mg Oral Daily   sevelamer carbonate  4,000 mg Oral TID WC   sodium chloride flush  3 mL Intravenous Q12H   thiamine  100 mg Oral  Daily   trimethoprim-polymyxin b  1 drop Right Eye Q4H   umeclidinium bromide  1 puff Inhalation Daily    Dialysis Orders: [facility] TTS 4h  400/1.5  68kg 2/2 bath  LFA AVF  Hep none   - mircera 257mg q2, last 4/13, was due 4/26  - venofer 50 q wk  - calcitriol 0.541m PO q HD   Assessment/Plan: Uremia - w/ azotemia, creat 28. Now improved. Missed HD x 2 wks. Pt is homeless, see below. 2. Homelessness - major issue w/ HD pt,  needs assistance with placement, SW/case manager aware - working on SNF placement. Housing resources previously provided to patient in outpatient HD center.  3. Fever: On admit, blood cultures NGTD. Afebrile. 4. ESRD - on HD TTS. Nausea during HD yesterday, 1.5L removed. Appears euvolemic and is close to EDW, may need to decrease UF goals.  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  Per their notes  "Given the age of the  fistula I would only offer revision if there were scabbing or ulcerations overlying aneurysmal areas."  No indication to re-consult at this time.  6. HTN/volume:  BP in goal.  Continue home meds - amlodipine '5mg'$  qd, coreg 12.'5mg'$  BID. UF as tolerated, see above. 7. Atrial Fib - on Coreg. Eliquis has been on hold, possibly too high risk for bleed to resume which is appropriate. 8. Secondary HPTH: Ca/Phos ok, continue home binders/VDRA.  9. Anemia of ESRD: Hgb stable, last 7.6. S/p 2U PRBCs this admit. Continue Aranesp 20061mq Thursday while here. S/p episode epistaxis this admit. 10. Nutrition: Alb low, continue supplements 11. Dispo- SW working on placement, pt reports he thinks he will be leaving on Thursday    Jahmier Willadsen, PA-C 11/28/2021, 8:31 AM  [facility] Pager: [phone]

## 2021-11-28 NOTE — Progress Notes (Signed)
VASCULAR LAB    Bilateral lower extremity venous duplex has been performed.  See CV proc for preliminary results.   Meral Geissinger, RVT 11/28/2021, 12:06 PM

## 2021-11-28 NOTE — Progress Notes (Addendum)
PROGRESS NOTE  Raymond [name]  DOB: 23-Mar-1956  PCP: [name], FNP RXV:[id number]  DOA: 11/11/2021  LOS: 72 days  Hospital Day: 18  Brief narrative: Raymond [name] is a 66 y.o. male with PMH significant for ESRD-HD-TTS, DM2, HTN, CAD, PAD, CHF, COPD, A-fib, chronic anemia, substance abuse, chronic hepatitis C Patient presented to the ED on 5/4 with complaint of chest pain, dyspnea after missing his dialysis for 2 weeks.  In the ED, he was volume overloaded with creatinine as high as 28, potassium high at 7.4, chest x-ray with pulmonary vascular congestion He was emergently dialyzed with subsequent improvement in volume status and electrolyte levels. His hospitalization was prolonged because of difficulty in placement.   Subjective: Patient was seen and examined this morning.  Not in distress.  States that he is not getting good sleep at night.  Asks for more sleeping pills.  Principal Problem:   ESRD (end stage renal disease) on dialysis [facility]) Active Problems:   Acute on chronic diastolic CHF (congestive heart failure) (HCC)   Acute metabolic encephalopathy   COPD (chronic obstructive pulmonary disease) (HCC)   Hypertension   Paroxysmal atrial fibrillation (HCC)   Substance abuse (HCC)     Assessment and Plan: ESRD-HD-TTS Volume overload status Acute on chronic diastolic CHF Hyperkalemia -Presented with dyspnea, chest pain, volume overload status after missing dialysis for 2 weeks -Dialysis resumed.  Blood pressure, volume status, electrolyte levels improved. -Currently on TTS dialysis per routine. -Continue amlodipine for blood pressure.  CAD/PAD/HLD -Continue statin -Not on antiplatelet or anticoagulation because of history of severe GI bleeding  COPD -Respiratory status stable.   Paroxysmal atrial fibrillation -Continue with amiodarone -Not on anticoagulation due to history of severe GI bleed.     Anemia of chronic disease -Received 2 units of PRBC  this admission, continue weekly EPO -Iron stores adequate, may need to increase EPO dose -Hemoglobin stable between 7 and 8 Recent Labs    10/14/21 1804 11/11/21 1034 11/15/21 0456 11/16/21 0725 11/17/21 0350 11/19/21 0946 11/20/21 0342 11/21/21 0333 11/23/21 0623 11/27/21 0410  HGB 8.4* 8.1* 6.7* 6.9* 9.0* 8.3* 7.5* 7.9* 7.9* 7.6*      Substance abuse -Counseled  Difficulty disposition -Case management involved  Bilateral lower extremity calf pain -Pending DVT scan of both lower extremities  Insomnia -He already has an order for melatonin 10 mg nightly as needed.  Reminded him to ask for it if needed.     Goals of care   Code Status: Full Code    Mobility: Encourage ambulation  Skin assessment:     Nutritional status:  Body mass index is 22.86 kg/m.  Nutrition Problem: Increased nutrient needs Etiology: chronic illness Signs/Symptoms: estimated needs     Diet:  Diet Order             Diet renal with fluid restriction Fluid restriction: Other (see comments); Room service appropriate? Yes; Fluid consistency: Thin  Diet effective now                   DVT prophylaxis:  heparin injection 5,000 Units Start: 11/11/21 1245   Antimicrobials: None Fluid: None Consultants: Nephrology Family Communication: None at bedside  Status is: Inpatient  Continue in-hospital care because: Difficulty placement Level of care: Med-Surg   Dispo: The patient is from: Homeless              Anticipated d/c is to: Nursing facility  Patient currently is medically stable to d/c.   Difficult to place patient Yes     Infusions:   sodium chloride      Scheduled Meds:  sodium chloride   Intravenous Once   sodium chloride   Intravenous Once   sodium chloride   Intravenous Once   amiodarone  200 mg Oral Daily   amLODipine  5 mg Oral QHS   aspirin EC  81 mg Oral Daily   calcitRIOL  0.5 mcg Oral Q T,Th,Sa-HD   carvedilol  12.5 mg Oral BID    Chlorhexidine Gluconate Cloth  6 each Topical Q0600   cinacalcet  180 mg Oral Q T,Th,Sa-HD   darbepoetin (ARANESP) injection - DIALYSIS  200 mcg Intravenous Q Thu-HD   feeding supplement (NEPRO CARB STEADY)  237 mL Oral TID BM   fluticasone furoate-vilanterol  1 puff Inhalation Daily   folic acid  1 mg Oral Daily   heparin  5,000 Units Subcutaneous Q12H   multivitamin  1 tablet Oral QHS   pantoprazole  40 mg Oral BID AC   rosuvastatin  10 mg Oral Daily   sevelamer carbonate  4,000 mg Oral TID WC   sodium chloride flush  3 mL Intravenous Q12H   thiamine  100 mg Oral Daily   trimethoprim-polymyxin b  1 drop Right Eye Q4H   umeclidinium bromide  1 puff Inhalation Daily    PRN meds: sodium chloride, acetaminophen, docusate sodium, melatonin, ondansetron (ZOFRAN) IV, polyethylene glycol, sodium chloride flush   Antimicrobials: Anti-infectives (From admission, onward)    None       Objective: Vitals:   11/28/21 0832 11/28/21 0924  BP: 111/65 118/65  Pulse: 68 60  Resp:  17  Temp: 98.2 F (36.8 C) 98.4 F (36.9 C)  SpO2: 98% 100%    Intake/Output Summary (Last 24 hours) at 11/28/2021 1123 Last data filed at 11/28/2021 0800 Gross per 24 hour  Intake 1020 ml  Output 1502 ml  Net -482 ml    Filed Weights   11/27/21 0830 11/27/21 1237 11/27/21 2102  Weight: 70.1 kg 68.6 kg 68.2 kg   Weight change: -1.6 kg Body mass index is 22.86 kg/m.   Physical Exam: General exam: Pleasant elderly African-American male.  Not in physical distress. Skin: No rashes, lesions or ulcers. HEENT: Atraumatic, normocephalic, no obvious bleeding Lungs: Clear to auscultation bilaterally CVS: Regular rate and rhythm, no murmur GI/Abd soft, nontender, nondistended, bowel sound present CNS: Alert, awake, oriented x3 Psychiatry: Mood appropriate Extremities: No pedal edema,  Data Review: I have personally reviewed the laboratory data and studies available.  F/u labs ordered [employer]  (From admission, onward)     Start     Ordered   Signed and Held  CBC  Once,   R       Question:  Specimen collection method  Answer:  Lab=Lab collect   Signed and Held   Signed and Held  Renal function panel  Once,   R       Question:  Specimen collection method  Answer:  Lab=Lab collect   Signed and Held   Signed and Held  CBC  Once,   R       Question:  Specimen collection method  Answer:  Lab=Lab collect   Signed and Held   Signed and Held  Renal function panel  Once,   R       Question:  Specimen collection method  Answer:  Lab=Lab collect   Signed  and Held   Signed and Held  CBC  Once,   R       Question:  Specimen collection method  Answer:  Lab=Lab collect   Signed and Held            Signed, Terrilee Croak, MD [facility] 11/28/2021

## 2021-11-29 DIAGNOSIS — Z992 Dependence on renal dialysis: Secondary | ICD-10-CM | POA: Diagnosis not present

## 2021-11-29 DIAGNOSIS — N186 End stage renal disease: Secondary | ICD-10-CM | POA: Diagnosis not present

## 2021-11-29 MED ORDER — CHLORHEXIDINE GLUCONATE CLOTH 2 % EX PADS
6.0000 | MEDICATED_PAD | Freq: Every day | CUTANEOUS | Status: DC
  Administered 2021-11-30: 6 via TOPICAL

## 2021-11-29 NOTE — Progress Notes (Signed)
  Mobility Specialist Criteria Algorithm Info.   11/29/21 1608  Pain Assessment  Pain Assessment 0-10  Pain Score 5  Pain Location LLE  Pain Descriptors / Indicators Pressure;Aching;Discomfort  Pain Intervention(s) Monitored during session  Mobility  Activity Ambulated with assistance in hallway  Range of Motion/Exercises Active;All extremities  Level of Assistance Modified independent, requires aide device or extra time  Assistive Device Front wheel walker  Distance Ambulated (ft) 540 ft  Activity Response Tolerated well   Patient received in supine agreeable to participate in mobility. Ambulated in hallway mod I with slow gait. Returned to room without incident. Complained of pain in LLE, described it as constant that increases while weight bearing and while laying on his side. Was left dangling EOB with all needs met, call bell in reach.   11/29/2021 4:28 PM  [place] Maciah Feeback, [place], [place]  Office: [phone]

## 2021-11-29 NOTE — Progress Notes (Addendum)
[facility] Progress Note   Subjective:   Patient seen and examined at bedside.  No specific complaints.  Feels he may be gaining weight while hospitalized due to having more to eat.  Denies CP, SOB, abdominal pain, weakness, dizziness and fatigue.   Objective Vitals:   11/28/21 1647 11/28/21 2111 11/29/21 0447 11/29/21 0904  BP: 124/63 135/64 127/78 139/73  Pulse: 73 71 65 64  Resp: '18 20 17 16  '$ Temp: (!) 97.4 F (36.3 C) 98.8 F (37.1 C) 98.4 F (36.9 C) 97.6 F (36.4 C)  TempSrc: Oral Oral Oral Oral  SpO2: 99% 98% 100% 100%  Weight:   70 kg   Height:       Physical Exam General:WDWN, alert male in NAD Heart:RRR, no mrg Lungs:CTAB, nml WOB on RA Abdomen:soft, NTND Extremities:no LE edema Dialysis Access: LU AVF +b/t   Filed Weights   11/27/21 1237 11/27/21 2102 11/29/21 0447  Weight: 68.6 kg 68.2 kg 70 kg    Intake/Output Summary (Last 24 hours) at 11/29/2021 1023 Last data filed at 11/28/2021 1700 Gross per 24 hour  Intake 600 ml  Output 0 ml  Net 600 ml    Additional Objective Labs: Basic Metabolic Panel: Recent Labs  Lab 11/23/21 0623 11/27/21 0410  NA 136 136  K 5.5* 4.6  CL 96* 98  CO2 32 30  GLUCOSE 82 80  BUN 47* 42*  CREATININE 10.70* 8.57*  CALCIUM 8.8* 9.0   CBC: Recent Labs  Lab 11/23/21 0623 11/27/21 0410  WBC 4.3 3.6*  NEUTROABS  --  1.6*  HGB 7.9* 7.6*  HCT 25.5* 24.8*  MCV 99.6 98.8  PLT 257 197   Studies/Results: VAS [place] LOWER EXTREMITY VENOUS (DVT)  Result Date: 11/28/2021  Lower Venous DVT Study Patient Name:  Raymond [name]  Date of Exam:   11/28/2021 Medical Rec #: [id number]      Accession #:    [id number] Date of Birth: 07/03/1956      Patient Gender: M Patient Age:   66 years Exam Location:  [facility] Procedure:      VAS [place] LOWER EXTREMITY VENOUS (DVT) Referring Phys: Terrilee Croak --------------------------------------------------------------------------------  Indications: Swelling. Other  Indications: Patient did not go to dialysis for 2 weeks. Limitations: Acoustic shadowing from arterial plaque. Comparison Study: Prior negative bilateral LEV done 04/05/2021 Performing Technologist: Sharion Dove RVS  Examination Guidelines: A complete evaluation includes B-mode imaging, spectral Doppler, color Doppler, and power Doppler as needed of all accessible portions of each vessel. Bilateral testing is considered an integral part of a complete examination. Limited examinations for reoccurring indications may be performed as noted. The reflux portion of the exam is performed with the patient in reverse Trendelenburg.  +---------+---------------+---------+-----------+----------+--------------+ RIGHT    CompressibilityPhasicitySpontaneityPropertiesThrombus Aging +---------+---------------+---------+-----------+----------+--------------+ CFV      Full           Yes      Yes                                 +---------+---------------+---------+-----------+----------+--------------+ SFJ      Full                                                        +---------+---------------+---------+-----------+----------+--------------+ FV Prox  Full                                                        +---------+---------------+---------+-----------+----------+--------------+ FV Mid   Full                                                        +---------+---------------+---------+-----------+----------+--------------+ FV DistalFull                                                        +---------+---------------+---------+-----------+----------+--------------+ PFV      Full                                                        +---------+---------------+---------+-----------+----------+--------------+ POP      Full           Yes      Yes                                 +---------+---------------+---------+-----------+----------+--------------+ PTV      Full                                                         +---------+---------------+---------+-----------+----------+--------------+ PERO     Full                                                        +---------+---------------+---------+-----------+----------+--------------+   +---------+---------------+---------+-----------+----------+--------------+ LEFT     CompressibilityPhasicitySpontaneityPropertiesThrombus Aging +---------+---------------+---------+-----------+----------+--------------+ CFV      Full           Yes      Yes                                 +---------+---------------+---------+-----------+----------+--------------+ SFJ      Full                                                        +---------+---------------+---------+-----------+----------+--------------+ FV Prox  Full                                                        +---------+---------------+---------+-----------+----------+--------------+  FV Mid   Full                                                        +---------+---------------+---------+-----------+----------+--------------+ FV DistalFull                                                        +---------+---------------+---------+-----------+----------+--------------+ PFV      Full                                                        +---------+---------------+---------+-----------+----------+--------------+ POP      Full           Yes      Yes                                 +---------+---------------+---------+-----------+----------+--------------+ PTV      Full                                                        +---------+---------------+---------+-----------+----------+--------------+ PERO     Full                                                        +---------+---------------+---------+-----------+----------+--------------+    Summary: BILATERAL: - No evidence of deep vein thrombosis seen in the  lower extremities, bilaterally. -No evidence of popliteal cyst, bilaterally.   *See table(s) above for measurements and observations.    Preliminary     Medications:  sodium chloride      sodium chloride   Intravenous Once   sodium chloride   Intravenous Once   sodium chloride   Intravenous Once   amiodarone  200 mg Oral Daily   amLODipine  5 mg Oral QHS   aspirin EC  81 mg Oral Daily   calcitRIOL  0.5 mcg Oral Q T,Th,Sa-HD   carvedilol  12.5 mg Oral BID   Chlorhexidine Gluconate Cloth  6 each Topical Q0600   cinacalcet  180 mg Oral Q T,Th,Sa-HD   darbepoetin (ARANESP) injection - DIALYSIS  200 mcg Intravenous Q Thu-HD   feeding supplement (NEPRO CARB STEADY)  237 mL Oral TID BM   fluticasone furoate-vilanterol  1 puff Inhalation Daily   folic acid  1 mg Oral Daily   heparin  5,000 Units Subcutaneous Q12H   multivitamin  1 tablet Oral QHS   pantoprazole  40 mg Oral BID AC   rosuvastatin  10 mg Oral Daily   sevelamer carbonate  4,000 mg Oral TID WC   sodium chloride flush  3 mL Intravenous Q12H   thiamine  100  mg Oral Daily   trimethoprim-polymyxin b  1 drop Right Eye Q4H   umeclidinium bromide  1 puff Inhalation Daily    Dialysis Orders: [facility] TTS 4h  400/1.5  68kg 2/2 bath  LFA AVF  Hep none   - mircera 258mg q2, last 4/13, was due 4/26  - venofer 50 q wk  - calcitriol 0.544m PO q HD   Assessment/Plan: Uremia - w/ azotemia, creat 28. Now improved. Missed HD x 2 wks. Pt is homeless, see below. 2. Homelessness - major issue w/ HD pt,  needs assistance with placement, SW/case manager aware - working on SNF placement. Housing resources previously provided to patient in outpatient HD center.  3. Fever: On admit, blood cultures NGTD. Afebrile. 4. ESRD - on HD TTS. HD tomorrow per regular schedule.  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  Per their notes  "Given the age of the fistula I would only offer revision  if there were scabbing or ulcerations overlying aneurysmal areas."  No indication to re-consult at this time.  6. HTN/volume:  BP in goal.  Continue home meds - amlodipine '5mg'$  qd, coreg 12.'5mg'$  BID. Possible weight gain during admission.  Nausea with HD, plan to increase EDW 69.5-70kg.  UF to new dry tomorrow.  7. Atrial Fib - on Coreg. Eliquis has been on hold, possibly too high risk for bleed to resume which is appropriate. 8. Secondary HPTH: Ca/Phos ok, continue home binders/VDRA.  9. Anemia of ESRD: Hgb stable, last 7.6. S/p 2U PRBCs this admit. Continue Aranesp [id number] Thursday while here. S/p episode epistaxis this admit. 10. Nutrition: Alb low, continue supplements. Renal diet w/fluid restrictions. 11.40ispo- SW working on placement, pt reports he thinks he will be leaving on Thursday.  To go to [facility] possibly later this week. 12. LE calf pain - LE doppler with no evidence of DVT or popliteal cyst.   LinJen MowA-C Car[facility]unter22/2023,10:23 AM  LOS: 18 days

## 2021-11-29 NOTE — Progress Notes (Signed)
PROGRESS NOTE  Raymond [name]  DOB: [birth year]  PCP: [name], FNP LOV:[id number]  DOA: 11/11/2021  LOS: 30 days  Hospital Day: 19  Brief narrative: Raymond [name] is a 66 y.o. male with PMH significant for ESRD-HD-TTS, DM2, HTN, CAD, PAD, CHF, COPD, A-fib, chronic anemia, substance abuse, chronic hepatitis C Patient presented to the ED on 5/4 with complaint of chest pain, dyspnea after missing his dialysis for 2 weeks.  In the ED, he was volume overloaded with creatinine as high as 28, potassium high at 7.4, chest x-ray with pulmonary vascular congestion He was emergently dialyzed with subsequent improvement in volume status and electrolyte levels. His hospitalization was prolonged because of difficulty in placement.  Subjective: Patient was seen and examined this morning.  Not in distress.  No new symptoms.  Principal Problem:   ESRD (end stage renal disease) on dialysis [facility]) Active Problems:   Acute on chronic diastolic CHF (congestive heart failure) (HCC)   Acute metabolic encephalopathy   COPD (chronic obstructive pulmonary disease) (HCC)   Hypertension   Paroxysmal atrial fibrillation (HCC)   Substance abuse (HCC)     Assessment and Plan: ESRD-HD-TTS Volume overload status Acute on chronic diastolic CHF Hyperkalemia -Presented with dyspnea, chest pain, volume overload status after missing dialysis for 2 weeks -Dialysis resumed.  Blood pressure, volume status, electrolyte levels improved. -Currently on TTS dialysis per routine. -Continue amlodipine for blood pressure.  CAD/PAD/HLD -Continue statin -Not on antiplatelet or anticoagulation because of history of severe GI bleeding  COPD -Respiratory status stable.   Paroxysmal atrial fibrillation -Continue with amiodarone -Not on anticoagulation due to history of severe GI bleed.     Anemia of chronic disease -Received 2 units of PRBC this admission, continue weekly EPO -Iron stores adequate, may need  to increase EPO dose -Hemoglobin stable between 7 and 8 Recent Labs    10/14/21 1804 11/11/21 1034 11/15/21 0456 11/16/21 0725 11/17/21 0350 11/19/21 0946 11/20/21 0342 11/21/21 0333 11/23/21 0623 11/27/21 0410  HGB 8.4* 8.1* 6.7* 6.9* 9.0* 8.3* 7.5* 7.9* 7.9* 7.6*      Substance abuse -Counseled  Difficulty disposition -Case management involved  Bilateral lower extremity calf pain -Pending DVT scan of both lower extremities  Insomnia -He already has an order for melatonin 10 mg nightly as needed.  Reminded him to ask for it if needed.    Goals of care   Code Status: Full Code    Mobility: Encourage ambulation  Skin assessment:     Nutritional status:  Body mass index is 23.46 kg/m.  Nutrition Problem: Increased nutrient needs Etiology: chronic illness Signs/Symptoms: estimated needs     Diet:  Diet Order             Diet renal with fluid restriction Fluid restriction: Other (see comments); Room service appropriate? Yes; Fluid consistency: Thin  Diet effective now                   DVT prophylaxis:  heparin injection 5,000 Units Start: 11/11/21 1245   Antimicrobials: None Fluid: None Consultants: Nephrology Family Communication: None at bedside  Status is: Inpatient  Continue in-hospital care because: Difficulty placement Level of care: Med-Surg   Dispo: The patient is from: Homeless              Anticipated d/c is to: Nursing facility              Patient currently is medically stable to d/c.   Difficult to  place patient Yes     Infusions:   sodium chloride      Scheduled Meds:  sodium chloride   Intravenous Once   sodium chloride   Intravenous Once   sodium chloride   Intravenous Once   amiodarone  200 mg Oral Daily   amLODipine  5 mg Oral QHS   aspirin EC  81 mg Oral Daily   calcitRIOL  0.5 mcg Oral Q T,Th,Sa-HD   carvedilol  12.5 mg Oral BID   Chlorhexidine Gluconate Cloth  6 each Topical Q0600   Chlorhexidine  Gluconate Cloth  6 each Topical Q0600   cinacalcet  180 mg Oral Q T,Th,Sa-HD   darbepoetin (ARANESP) injection - DIALYSIS  200 mcg Intravenous Q Thu-HD   feeding supplement (NEPRO CARB STEADY)  237 mL Oral TID BM   fluticasone furoate-vilanterol  1 puff Inhalation Daily   folic acid  1 mg Oral Daily   heparin  5,000 Units Subcutaneous Q12H   multivitamin  1 tablet Oral QHS   pantoprazole  40 mg Oral BID AC   rosuvastatin  10 mg Oral Daily   sevelamer carbonate  4,000 mg Oral TID WC   sodium chloride flush  3 mL Intravenous Q12H   thiamine  100 mg Oral Daily   trimethoprim-polymyxin b  1 drop Right Eye Q4H   umeclidinium bromide  1 puff Inhalation Daily    PRN meds: sodium chloride, acetaminophen, docusate sodium, melatonin, ondansetron (ZOFRAN) IV, polyethylene glycol, sodium chloride flush   Antimicrobials: Anti-infectives (From admission, onward)    None       Objective: Vitals:   11/29/21 0447 11/29/21 0904  BP: 127/78 139/73  Pulse: 65 64  Resp: 17 16  Temp: 98.4 F (36.9 C) 97.6 F (36.4 C)  SpO2: 100% 100%    Intake/Output Summary (Last 24 hours) at 11/29/2021 1311 Last data filed at 11/29/2021 0800 Gross per 24 hour  Intake 540 ml  Output 0 ml  Net 540 ml    Filed Weights   11/27/21 1237 11/27/21 2102 11/29/21 0447  Weight: 68.6 kg 68.2 kg 70 kg   Weight change: -0.1 kg Body mass index is 23.46 kg/m.   Physical Exam: General exam: Pleasant elderly African-American male.  Not in physical distress. Skin: No rashes, lesions or ulcers. HEENT: Atraumatic, normocephalic, no obvious bleeding Lungs: Clear to auscultation bilaterally CVS: Regular rate and rhythm, no murmur GI/Abd soft, nontender, nondistended, bowel sound present CNS: Alert, awake, oriented x3 Psychiatry: Mood appropriate Extremities: No pedal edema,  Data Review: I have personally reviewed the laboratory data and studies available.  F/u labs ordered [employer] (From admission,  onward)     Start     Ordered   Signed and Held  CBC  Once,   R       Question:  Specimen collection method  Answer:  Lab=Lab collect   Signed and Held   Signed and Held  Renal function panel  Once,   R       Question:  Specimen collection method  Answer:  Lab=Lab collect   Signed and Held   Signed and Held  CBC  Once,   R       Question:  Specimen collection method  Answer:  Lab=Lab collect   Signed and Held   Signed and Held  Renal function panel  Once,   R       Question:  Specimen collection method  Answer:  Lab=Lab collect   Signed and  Held   Signed and Held  CBC  Once,   R       Question:  Specimen collection method  Answer:  Lab=Lab collect   Signed and Held   Signed and Held  Renal function panel  Once,   R       Question:  Specimen collection method  Answer:  Lab=Lab collect   Signed and Held   Signed and Held  CBC  Once,   R       Question:  Specimen collection method  Answer:  Lab=Lab collect   Signed and Held            Signed, Terrilee Croak, MD [facility] 11/29/2021

## 2021-11-30 DIAGNOSIS — N186 End stage renal disease: Secondary | ICD-10-CM | POA: Diagnosis not present

## 2021-11-30 DIAGNOSIS — Z992 Dependence on renal dialysis: Secondary | ICD-10-CM | POA: Diagnosis not present

## 2021-11-30 DIAGNOSIS — I5033 Acute on chronic diastolic (congestive) heart failure: Secondary | ICD-10-CM | POA: Diagnosis not present

## 2021-11-30 DIAGNOSIS — G9341 Metabolic encephalopathy: Secondary | ICD-10-CM | POA: Diagnosis not present

## 2021-11-30 LAB — CBC WITH DIFFERENTIAL/PLATELET
Abs Immature Granulocytes: 0.01 10*3/uL (ref 0.00–0.07)
Basophils Absolute: 0 10*3/uL (ref 0.0–0.1)
Basophils Relative: 0 %
Eosinophils Absolute: 0.1 10*3/uL (ref 0.0–0.5)
Eosinophils Relative: 2 %
HCT: 22.1 % — ABNORMAL LOW (ref 39.0–52.0)
Hemoglobin: 6.8 g/dL — CL (ref 13.0–17.0)
Immature Granulocytes: 0 %
Lymphocytes Relative: 29 %
Lymphs Abs: 1.1 10*3/uL (ref 0.7–4.0)
MCH: 30.5 pg (ref 26.0–34.0)
MCHC: 30.8 g/dL (ref 30.0–36.0)
MCV: 99.1 fL (ref 80.0–100.0)
Monocytes Absolute: 0.8 10*3/uL (ref 0.1–1.0)
Monocytes Relative: 20 %
Neutro Abs: 1.9 10*3/uL (ref 1.7–7.7)
Neutrophils Relative %: 49 %
Platelets: 169 10*3/uL (ref 150–400)
RBC: 2.23 MIL/uL — ABNORMAL LOW (ref 4.22–5.81)
RDW: 18.6 % — ABNORMAL HIGH (ref 11.5–15.5)
WBC: 3.8 10*3/uL — ABNORMAL LOW (ref 4.0–10.5)
nRBC: 0 % (ref 0.0–0.2)

## 2021-11-30 LAB — RENAL FUNCTION PANEL
Albumin: 2.6 g/dL — ABNORMAL LOW (ref 3.5–5.0)
Anion gap: 12 (ref 5–15)
BUN: 64 mg/dL — ABNORMAL HIGH (ref 8–23)
CO2: 28 mmol/L (ref 22–32)
Calcium: 9.3 mg/dL (ref 8.9–10.3)
Chloride: 96 mmol/L — ABNORMAL LOW (ref 98–111)
Creatinine, Ser: 12.37 mg/dL — ABNORMAL HIGH (ref 0.61–1.24)
GFR, Estimated: 4 mL/min — ABNORMAL LOW (ref >60)
Glucose, Bld: 105 mg/dL — ABNORMAL HIGH (ref 70–99)
Phosphorus: 2.3 mg/dL — ABNORMAL LOW (ref 2.5–4.6)
Potassium: 4.6 mmol/L (ref 3.5–5.1)
Sodium: 136 mmol/L (ref 135–145)

## 2021-11-30 LAB — PREPARE RBC (CROSSMATCH)

## 2021-11-30 MED ORDER — SODIUM CHLORIDE 0.9% IV SOLUTION: Freq: Once | INTRAVENOUS | Status: AC

## 2021-11-30 NOTE — Progress Notes (Signed)
PT Cancellation Note  Patient Details Name: Raymond [name] MRN: [id number] DOB: 1956/06/15   Cancelled Treatment:    Reason Eval/Treat Not Completed: Patient at procedure or test/unavailable  Currently in HD;  Will follow up later today as time allows;  Otherwise, will follow up for PT tomorrow;   Thank you,  Roney Marion, PT  Acute Rehabilitation Services Pager [phone] Office [phone]   Colletta [place] 11/30/2021, 8:33 AM

## 2021-11-30 NOTE — Plan of Care (Signed)

## 2021-11-30 NOTE — Progress Notes (Addendum)
[facility] Progress Note   Subjective:   Patient seen and examined at bedside in dialysis.  Tolerating well so far.  No specific complaints.  Hemoglobin dropped this AM, 1 unit pRBC ordered.   Objective Vitals:   11/30/21 0900 11/30/21 0930 11/30/21 1000 11/30/21 1015  BP: 121/68 114/60 (!) 96/53 (!) 72/40  Pulse: 65 63 67 69  Resp:      Temp:      TempSrc:      SpO2:      Weight:      Height:       Physical Exam General:WDWN male in NAD Heart:RRR, no mrg Lungs:CTAB, nml WOB on RA Abdomen:soft, NTND Extremities:no LE edema Dialysis Access: LU AVF in use   Filed Weights   11/29/21 0447 11/30/21 0500 11/30/21 0817  Weight: 70 kg 70.1 kg 73.9 kg    Intake/Output Summary (Last 24 hours) at 11/30/2021 1033 Last data filed at 11/30/2021 0443 Gross per 24 hour  Intake 651 ml  Output 0 ml  Net 651 ml    Additional Objective Labs: Basic Metabolic Panel: Recent Labs  Lab 11/27/21 0410 11/30/21 0841  NA 136 136  K 4.6 4.6  CL 98 96*  CO2 30 28  GLUCOSE 80 105*  BUN 42* 64*  CREATININE 8.57* 12.37*  CALCIUM 9.0 9.3  PHOS  --  2.3*   Liver Function Tests: Recent Labs  Lab 11/30/21 0841  ALBUMIN 2.6*   CBC: Recent Labs  Lab 11/27/21 0410 11/30/21 0841  WBC 3.6* 3.8*  NEUTROABS 1.6* 1.9  HGB 7.6* 6.8*  HCT 24.8* 22.1*  MCV 98.8 99.1  PLT 197 169   Studies/Results: VAS [place] LOWER EXTREMITY VENOUS (DVT)  Result Date: 11/29/2021  Lower Venous DVT Study Patient Name:  LIAM CAMMARATA Ayo  Date of Exam:   11/28/2021 Medical Rec #: [id number]      Accession #:    [id number] Date of Birth: Apr 13, 1956      Patient Gender: M Patient Age:   66 years Exam Location:  [facility] Procedure:      VAS [place] LOWER EXTREMITY VENOUS (DVT) Referring Phys: Terrilee Croak --------------------------------------------------------------------------------  Indications: Swelling. Other Indications: Patient did not go to dialysis for 2 weeks. Limitations: Acoustic shadowing  from arterial plaque. Comparison Study: Prior negative bilateral LEV done 04/05/2021 Performing Technologist: Sharion Dove RVS  Examination Guidelines: A complete evaluation includes B-mode imaging, spectral Doppler, color Doppler, and power Doppler as needed of all accessible portions of each vessel. Bilateral testing is considered an integral part of a complete examination. Limited examinations for reoccurring indications may be performed as noted. The reflux portion of the exam is performed with the patient in reverse Trendelenburg.  +---------+---------------+---------+-----------+----------+--------------+ RIGHT    CompressibilityPhasicitySpontaneityPropertiesThrombus Aging +---------+---------------+---------+-----------+----------+--------------+ CFV      Full           Yes      Yes                                 +---------+---------------+---------+-----------+----------+--------------+ SFJ      Full                                                        +---------+---------------+---------+-----------+----------+--------------+ FV Prox  Full                                                        +---------+---------------+---------+-----------+----------+--------------+  FV Mid   Full                                                        +---------+---------------+---------+-----------+----------+--------------+ FV DistalFull                                                        +---------+---------------+---------+-----------+----------+--------------+ PFV      Full                                                        +---------+---------------+---------+-----------+----------+--------------+ POP      Full           Yes      Yes                                 +---------+---------------+---------+-----------+----------+--------------+ PTV      Full                                                         +---------+---------------+---------+-----------+----------+--------------+ PERO     Full                                                        +---------+---------------+---------+-----------+----------+--------------+   +---------+---------------+---------+-----------+----------+--------------+ LEFT     CompressibilityPhasicitySpontaneityPropertiesThrombus Aging +---------+---------------+---------+-----------+----------+--------------+ CFV      Full           Yes      Yes                                 +---------+---------------+---------+-----------+----------+--------------+ SFJ      Full                                                        +---------+---------------+---------+-----------+----------+--------------+ FV Prox  Full                                                        +---------+---------------+---------+-----------+----------+--------------+ FV Mid   Full                                                        +---------+---------------+---------+-----------+----------+--------------+  FV DistalFull                                                        +---------+---------------+---------+-----------+----------+--------------+ PFV      Full                                                        +---------+---------------+---------+-----------+----------+--------------+ POP      Full           Yes      Yes                                 +---------+---------------+---------+-----------+----------+--------------+ PTV      Full                                                        +---------+---------------+---------+-----------+----------+--------------+ PERO     Full                                                        +---------+---------------+---------+-----------+----------+--------------+     Summary: BILATERAL: - No evidence of deep vein thrombosis seen in the lower extremities, bilaterally. -No evidence of  popliteal cyst, bilaterally.   *See table(s) above for measurements and observations. Electronically signed by Monica Martinez MD on 11/29/2021 at 5:10:49 PM.    Final     Medications:  sodium chloride      sodium chloride   Intravenous Once   sodium chloride   Intravenous Once   sodium chloride   Intravenous Once   sodium chloride   Intravenous Once   amiodarone  200 mg Oral Daily   amLODipine  5 mg Oral QHS   aspirin EC  81 mg Oral Daily   calcitRIOL  0.5 mcg Oral Q T,Th,Sa-HD   carvedilol  12.5 mg Oral BID   Chlorhexidine Gluconate Cloth  6 each Topical Q0600   Chlorhexidine Gluconate Cloth  6 each Topical Q0600   cinacalcet  180 mg Oral Q T,Th,Sa-HD   darbepoetin (ARANESP) injection - DIALYSIS  200 mcg Intravenous Q Thu-HD   feeding supplement (NEPRO CARB STEADY)  237 mL Oral TID BM   fluticasone furoate-vilanterol  1 puff Inhalation Daily   folic acid  1 mg Oral Daily   heparin  5,000 Units Subcutaneous Q12H   multivitamin  1 tablet Oral QHS   pantoprazole  40 mg Oral BID AC   rosuvastatin  10 mg Oral Daily   sevelamer carbonate  4,000 mg Oral TID WC   sodium chloride flush  3 mL Intravenous Q12H   thiamine  100 mg Oral Daily   trimethoprim-polymyxin b  1 drop Right Eye Q4H   umeclidinium bromide  1 puff Inhalation Daily    Dialysis Orders: [facility] TTS 4h  400/1.5  68kg 2/2 bath  LFA AVF  Hep none   - mircera 299mg q2, last 4/13, was due 4/26  - venofer 50 q wk  - calcitriol 0.523m PO q HD   Assessment/Plan: Uremia - w/ azotemia, creat 28. Now resolved. Missed HD x 2 wks prior to admission. Pt is homeless, see below. 2. Homelessness - major issue w/ HD pt,  needs assistance with placement, SW/case manager aware - working on SNF placement. Housing resources previously provided to patient in outpatient HD center.  3. Fever: On admit, blood cultures NGTD. Afebrile. 4. ESRD - on HD TTS. HD today per regular schedule.  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  Per their notes  "Given the age of the fistula I would only offer revision if there were scabbing or ulcerations overlying aneurysmal areas."  No indication to re-consult at this time.  6. HTN/volume:  BP in goal.  Continue home meds - amlodipine '5mg'$  qd, coreg 12.'5mg'$  BID. Possible weight gain during admission.  Nausea with HD, plan to increase EDW 69.5-70kg.  UF to new dry tomorrow. Standing weights. 7. Atrial Fib - on Coreg. Eliquis has been on hold, possibly too high risk for bleed to resume which is appropriate. 8. Secondary HPTH: Ca/Phos ok, continue home binders/VDRA.  9. Anemia of ESRD: Hgb drop to 6.8, 1 unit pRBC ordered w/HD today, s/p 2U PRBCs this admit. Continue Aranesp 20040mq Thursday while here. S/p episode epistaxis this admit. 10. Nutrition: Alb low, continue supplements. Renal diet w/fluid restrictions. 11.48ispo- SW working on placement, pt reports he thinks he will be leaving on Thursday.  To go to [facility] possibly later this week. 12. LE calf pain - LE doppler with no evidence of DVT or popliteal cyst.   LinJen MowA-C [facility] 11/30/2021,10:33 AM  LOS: 19 days

## 2021-11-30 NOTE — Progress Notes (Signed)
PROGRESS NOTE    Raymond [name]  BHA:[id number] DOB: 1956-05-21 DOA: 11/11/2021 PCP: [name], FNP   Brief Narrative: Mr. Whitter was admitted to the hospital with the working diagnosis of volume overload in the setting of ESRD.   66 yo male with the past medical history of ESRD on HD, T/TH/Sat heart failure, COPD, paroxysmal atrial fibrillation who presented with dyspnea and chest pain. Apparently patient has missed 2 weeks of hemodialysis. Continue use crack cocaine. On the day of hospitalization he suffered acute chest pain that prompted him to go to urgent care. He received SL nitroglycerin and referred to the ED. Blood pressure 184/100. HR 79, RR 13, 02 saturation 97%, lungs with rales bilaterally with increased work of breathing, heart with S1 and S2 present and rhythmic, abdomen soft and no lower extremity edema. Positive confusion.   Na 143, K 7,4 CL 99, bicarbonate 20, glucose 90, bun 178 cr 28,3  High sensitive troponin 51  Wbc 3,7 hgb 8,1, plt 158   Chest radiograph with cardiomegaly and bilateral hilar vascular congestion, no infiltrates or effusions.   EKG 83 bpm, normal axis, normal intervals, sinus rhythm with right atrial enlargement, no significant ST segment or T wave changes. Positive LVH.   Patient underwent emergent hemodialysis with ultrafiltration 3,5 L with improvement in volume status but not back to baseline.   Plan for HD on 05/06   05/07 positive fever, sent for blood cultures.  05/08 afebrile, positive anemia requiring PRBC transfusion 1 units 05/09 persistent anemia, plan for PRBC transfusion on HD    Pending placement at SNF   Assessment and Plan:  ESRD Nephrology consulted. Patient receiving HD on a Tuesday, Thursday, Saturday schedule  Acute on chronic diastolic heart failure Volume overload Fluid management with HD. Resolved.  CAD PAD Hyperlipidemia -Continue Crestor  COPD Stable. -Continue Breo Ellipta  Paroxysmal atrial  fibrillation -Continue amiodarone  Anemia of chronic disease In setting of kidney disease. Patient has received a total of 3 units this admission. No evidence of acute bleed. -FOBT -CBC in AM -Continue Aranesp per nephrology  Substance abuse Counseled  Bilateral calf pain LE venous duplex scan was negative for acute or chronic DVT.  Insomnia -Continue melatonin PRN  DVT prophylaxis: Heparin subq Code Status:   Code Status: Full Code Family Communication: None at bedside Disposition Plan: Discharge to SNF when bed available   Consultants:  Nephrology  Procedures:  Hemodialysis  Antimicrobials: None    Subjective: In dialysis unit receiving HD. No concerns from patient this morning. No issues noted overnight.  Objective: BP 117/80 (BP Location: Right Arm)   Pulse 65   Temp 98 F (36.7 C) (Oral)   Resp 18   Ht '5\' 8"'$  (1.727 m)   Wt 70.1 kg   SpO2 100%   BMI 23.50 kg/m   Examination:  General exam: Appears calm and comfortable Respiratory system: Clear to auscultation. Respiratory effort normal. Cardiovascular system: S1 & S2 heard, RRR. 2/6 systolic murmur. Gastrointestinal system: Abdomen is nondistended, soft and nontender. Normal bowel sounds heard. Central nervous system: Alert and oriented. No focal neurological deficits. Musculoskeletal: No edema. No calf tenderness Skin: No cyanosis. No rashes Psychiatry: Judgement and insight appear normal. Mood & affect appropriate.    Data Reviewed: I have personally reviewed following labs and imaging studies  CBC Lab Results  Component Value Date   WBC 3.6 (L) 11/27/2021   RBC 2.51 (L) 11/27/2021   HGB 7.6 (L) 11/27/2021  HCT 24.8 (L) 11/27/2021   MCV 98.8 11/27/2021   MCH 30.3 11/27/2021   PLT 197 11/27/2021   MCHC 30.6 11/27/2021   RDW 17.6 (H) 11/27/2021   LYMPHSABS 1.1 11/27/2021   MONOABS 0.8 11/27/2021   EOSABS 0.1 11/27/2021   BASOSABS 0.0 01/75/1025     Last metabolic panel Lab  Results  Component Value Date   NA 136 11/27/2021   K 4.6 11/27/2021   CL 98 11/27/2021   CO2 30 11/27/2021   BUN 42 (H) 11/27/2021   CREATININE 8.57 (H) 11/27/2021   GLUCOSE 80 11/27/2021   GFRNONAA 6 (L) 11/27/2021   GFRAA 6 (L) 01/18/2020   CALCIUM 9.0 11/27/2021   PHOS 4.5 11/16/2021   PROT 7.0 11/11/2021   ALBUMIN 2.2 (L) 11/16/2021   LABGLOB 3.9 05/15/2018   AGRATIO 0.9 (L) 05/15/2018   BILITOT 0.4 11/11/2021   ALKPHOS 47 11/11/2021   AST <5 (L) 11/11/2021   ALT 7 11/11/2021   ANIONGAP 8 11/27/2021    GFR: Estimated Creatinine Clearance: 8 mL/min (A) (by C-G formula based on SCr of 8.57 mg/dL (H)).  No results found for this or any previous visit (from the past 240 hour(s)).    Radiology Studies: VAS [place] LOWER EXTREMITY VENOUS (DVT)  Result Date: 11/29/2021  Lower Venous DVT Study Patient Name:  Raymond [name]  Date of Exam:   11/28/2021 Medical Rec #: [id number]      Accession #:    [id number] Date of Birth: 17-Jul-1955      Patient Gender: M Patient Age:   10 years Exam Location:  [facility] Procedure:      VAS [place] LOWER EXTREMITY VENOUS (DVT) Referring Phys: Terrilee Croak --------------------------------------------------------------------------------  Indications: Swelling. Other Indications: Patient did not go to dialysis for 2 weeks. Limitations: Acoustic shadowing from arterial plaque. Comparison Study: Prior negative bilateral LEV done 04/05/2021 Performing Technologist: Sharion Dove RVS  Examination Guidelines: A complete evaluation includes B-mode imaging, spectral Doppler, color Doppler, and power Doppler as needed of all accessible portions of each vessel. Bilateral testing is considered an integral part of a complete examination. Limited examinations for reoccurring indications may be performed as noted. The reflux portion of the exam is performed with the patient in reverse Trendelenburg.   +---------+---------------+---------+-----------+----------+--------------+ RIGHT    CompressibilityPhasicitySpontaneityPropertiesThrombus Aging +---------+---------------+---------+-----------+----------+--------------+ CFV      Full           Yes      Yes                                 +---------+---------------+---------+-----------+----------+--------------+ SFJ      Full                                                        +---------+---------------+---------+-----------+----------+--------------+ FV Prox  Full                                                        +---------+---------------+---------+-----------+----------+--------------+ FV Mid   Full                                                        +---------+---------------+---------+-----------+----------+--------------+  FV DistalFull                                                        +---------+---------------+---------+-----------+----------+--------------+ PFV      Full                                                        +---------+---------------+---------+-----------+----------+--------------+ POP      Full           Yes      Yes                                 +---------+---------------+---------+-----------+----------+--------------+ PTV      Full                                                        +---------+---------------+---------+-----------+----------+--------------+ PERO     Full                                                        +---------+---------------+---------+-----------+----------+--------------+   +---------+---------------+---------+-----------+----------+--------------+ LEFT     CompressibilityPhasicitySpontaneityPropertiesThrombus Aging +---------+---------------+---------+-----------+----------+--------------+ CFV      Full           Yes      Yes                                  +---------+---------------+---------+-----------+----------+--------------+ SFJ      Full                                                        +---------+---------------+---------+-----------+----------+--------------+ FV Prox  Full                                                        +---------+---------------+---------+-----------+----------+--------------+ FV Mid   Full                                                        +---------+---------------+---------+-----------+----------+--------------+ FV DistalFull                                                        +---------+---------------+---------+-----------+----------+--------------+  PFV      Full                                                        +---------+---------------+---------+-----------+----------+--------------+ POP      Full           Yes      Yes                                 +---------+---------------+---------+-----------+----------+--------------+ PTV      Full                                                        +---------+---------------+---------+-----------+----------+--------------+ PERO     Full                                                        +---------+---------------+---------+-----------+----------+--------------+     Summary: BILATERAL: - No evidence of deep vein thrombosis seen in the lower extremities, bilaterally. -No evidence of popliteal cyst, bilaterally.   *See table(s) above for measurements and observations. Electronically signed by Monica Martinez MD on 11/29/2021 at 5:10:49 PM.    Final       LOS: 33 days    Cordelia Poche, MD [facility] 11/30/2021, 8:41 AM   If 7PM-7AM, please contact night-coverage [email or web address]

## 2021-11-30 NOTE — TOC Progression Note (Signed)
Transition of Care [facility]) - Initial/Assessment Note    Patient Details  Name: Raymond [name] MRN: [id number] Date of Birth: 03/04/1956  Transition of Care [facility]) CM/SW Contact:    Milinda Antis, [place] Phone Number: 11/30/2021, 12:13 PM  Clinical Narrative:                 CSW met with the patient at bedside on the dialysis unit.  The patient is agreeable to discharging to the recovery home.   CSW spoke with the administrator for [facility] and the facility can possibly accept the patient tomorrow.    Pending: Final decision from recovery home.  Expected Discharge Plan: Skilled Nursing Facility Barriers to Discharge: Continued Medical Work up, Homeless with medical needs, Family Issues, No SNF bed, Insurance Authorization   Patient Goals and CMS Choice Patient states their goals for this hospitalization and ongoing recovery are:: To go to rehab CMS Medicare.gov Compare Post Acute Care list provided to:: Patient Choice offered to / list presented to : Patient  Expected Discharge Plan and Services Expected Discharge Plan: [place]       Living arrangements for the past 2 months: Homeless                                      Prior Living Arrangements/Services Living arrangements for the past 2 months: Homeless Lives with:: Self Patient language and need for interpreter reviewed:: Yes Do you feel safe going back to the place where you live?: No   Homeless  Need for Family Participation in Patient Care: Yes (Comment) Care giver support system in place?: No (comment)   Criminal Activity/Legal Involvement Pertinent to Current Situation/Hospitalization: No - Comment as needed  Activities of Daily Living Home Assistive Devices/Equipment: None ADL Screening (condition at time of admission) Patient's cognitive ability adequate to safely complete daily activities?: Yes Is the patient deaf or have difficulty hearing?: No Does the patient have difficulty  seeing, even when wearing glasses/contacts?: Yes Does the patient have difficulty concentrating, remembering, or making decisions?: No Patient able to express need for assistance with ADLs?: Yes Does the patient have difficulty dressing or bathing?: No Independently performs ADLs?: Yes (appropriate for developmental age) Does the patient have difficulty walking or climbing stairs?: No Weakness of Legs: Both Weakness of Arms/Hands: None  Permission Sought/Granted Permission sought to share information with : Other (comment) (CSW) Permission granted to share information with : Yes, Verbal Permission Granted     Permission granted to share info w AGENCY: SNFs or shelters        Emotional Assessment Appearance:: Appears older than stated age Attitude/Demeanor/Rapport: Engaged Affect (typically observed): Adaptable Orientation: : Oriented to Situation, Oriented to Place, Oriented to Self Alcohol / Substance Use: Illicit Drugs Psych Involvement: No (comment)  Admission diagnosis:  Hyperkalemia [E87.5] CHF (congestive heart failure) (HCC) [I50.9] Uremia [N19] Patient Active Problem List   Diagnosis Date Noted   Hypertension    Paroxysmal atrial fibrillation (HCC)    Substance abuse (HCC)    Thrombocytopenia ([place]) 09/15/2021   Hypertensive urgency 09/13/2021   Uncontrolled hypertension    Uremia    Severe episode of recurrent major depressive disorder, without psychotic features ([place])    ESRD (end stage renal disease) on dialysis ([place]) 05/18/2021   Cardiac arrest ([place]) 04/03/2021   Anisocoria 04/03/2021   Acute on chronic diastolic CHF (congestive heart failure) ([place]) 04/03/2021  Hypothermia not due to cold exposure 04/03/2021   Junctional bradycardia 04/03/2021   Pancytopenia ([place]) 01/25/2021   AMS (altered mental status) 01/19/2021   Chronic viral hepatitis C ([place]) 12/25/2020   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 ([place]) 08/05/2020   Acute upper GI bleed  58/03/9832   Acute metabolic encephalopathy 82/50/5397   COVID-19 virus infection 07/29/2020   AVM (arteriovenous malformation) of stomach, acquired with hemorrhage    AVM (arteriovenous malformation) of small bowel, acquired with hemorrhage    Current use of long term anticoagulation    Sensorineural hearing loss (SNHL) of both ears 03/30/2020   History of arteriovenous malformation (AVM) 01/14/2020   UGIB (upper gastrointestinal bleed) 01/14/2020   Acute blood loss anemia 01/14/2020   Elevated troponin 01/14/2020   Allergy, unspecified, initial encounter 04/29/2019   Gastroenteritis 12/24/2018   Substance use disorder 12/24/2018   Type 2 diabetes mellitus with other diabetic kidney complication ([place]) 67/34/1937   Noncompliance of patient with renal dialysis 09/12/2018   Benign neoplasm of colon    Melena    Anemia    H/O medication noncompliance    Polysubstance (excluding opioids) dependence (HCC)    Atrial flutter with rapid ventricular response ([place]) 08/10/2018   Chest pain 08/10/2018   PAF (paroxysmal atrial fibrillation) ([place]) 08/01/2018   Acute respiratory failure with hypoxia ([place]) 07/13/2018   Non-compliance with renal dialysis 07/13/2018   PAD (peripheral artery disease) ([place]) 04/11/2018   Sepsis ([place]) 03/31/2018   Preop cardiovascular exam 03/29/2018   Idiopathic gout, unspecified site 12/13/2017   Other specified complication of vascular prosthetic devices, implants and grafts, initial encounter ([place]) 11/03/2017   Volume overload 08/07/2017   Dyspnea 06/20/2017   Acute bronchitis    COPD (chronic obstructive pulmonary disease) ([place])    ESRD on hemodialysis ([place])    Hypoxia 12/05/2016   Hyperkalemia 12/19/2015   Hypoglycemia 12/19/2015   Polysubstance abuse ([place]) 12/19/2015   Homeless 12/19/2015   Anemia associated with stage 5 chronic renal failure ([place]) 12/19/2015   BPH without urinary obstruction 10/29/2015   Erectile dysfunction 10/29/2015   Hematuria,  gross 10/29/2015   Hypercalcemia 11/07/2014   Pruritus, unspecified 10/17/2014   Coagulation defect, unspecified ([place]) 09/17/2014   Thrombocytopenia, unspecified ([place]) 10/17/2013   Iron deficiency anemia, unspecified 08/28/2013   Atherosclerosis of native arteries of extremity with intermittent claudication ([place]) 12/04/2012   ESRD (end stage renal disease) ([place]) 12/04/2012   Hypertensive chronic kidney disease with stage 5 chronic kidney disease or end stage renal disease ([place]) 11/20/2012   Secondary hyperparathyroidism of renal origin ([place]) 11/20/2012   HEPATITIS C 09/07/2006   HYPERCHOLESTEROLEMIA 09/07/2006   HYPERTENSION, BENIGN SYSTEMIC 09/07/2006   PCP:  [name], FNP Pharmacy:   [facility] [address] [place] [address] Phone: [phone] Fax: [place], [place] [address] Phone: [phone] Fax: [phone]     Social Determinants of Health (SDOH) Interventions    Readmission Risk Interventions    11/26/2021    5:07 PM 06/22/2020    3:40 PM  Readmission Risk Prevention Plan  Transportation Screening Complete Complete  Medication Review (RN Care Manager) Complete Complete  PCP or Specialist appointment within 3-5 days of discharge  Complete  HRI or Home Care Consult  Complete  SW Recovery Care/Counseling Consult Complete Complete  Palliative Care Screening Not  Applicable Not [place]  Patient Refused

## 2021-12-01 ENCOUNTER — Other Ambulatory Visit (HOSPITAL_COMMUNITY): Payer: Self-pay

## 2021-12-01 LAB — TYPE AND SCREEN
ABO/RH(D): B POS
Antibody Screen: NEGATIVE
Unit division: 0

## 2021-12-01 LAB — BPAM RBC
Blood Product Expiration Date: 202306062359
ISSUE DATE / TIME: 202305231451
Unit Type and Rh: 7300

## 2021-12-01 LAB — CBC
HCT: 24.6 % — ABNORMAL LOW (ref 39.0–52.0)
Hemoglobin: 7.9 g/dL — ABNORMAL LOW (ref 13.0–17.0)
MCH: 30.7 pg (ref 26.0–34.0)
MCHC: 32.1 g/dL (ref 30.0–36.0)
MCV: 95.7 fL (ref 80.0–100.0)
Platelets: 152 10*3/uL (ref 150–400)
RBC: 2.57 MIL/uL — ABNORMAL LOW (ref 4.22–5.81)
RDW: 19.2 % — ABNORMAL HIGH (ref 11.5–15.5)
WBC: 3.4 10*3/uL — ABNORMAL LOW (ref 4.0–10.5)
nRBC: 0 % (ref 0.0–0.2)

## 2021-12-01 MED ORDER — CALCITRIOL 0.5 MCG PO CAPS: 0.5000 ug | ORAL_CAPSULE | ORAL | Status: DC

## 2021-12-01 MED ORDER — NEPRO/CARBSTEADY PO LIQD: 237.0000 mL | Freq: Three times a day (TID) | ORAL | 0 refills | Status: DC

## 2021-12-01 MED ORDER — NEPRO/CARBSTEADY PO LIQD
237.0000 mL | Freq: Three times a day (TID) | ORAL | 30 refills | Status: DC
  Filled 2021-12-01: qty 237, 1d supply, fill #0

## 2021-12-01 NOTE — TOC Progression Note (Addendum)
Transition of Care [facility]) - Initial/Assessment Note    Patient Details  Name: Raymond [name] MRN: [id number] Date of Birth: [birth year]  Transition of Care [facility]) CM/SW Contact:    Milinda Antis, LCSWA Phone Number: 12/01/2021, 9:10 AM  Clinical Narrative:                 CSW received a call from T. Pearline Cables with [facility].  The recovery home can accept the patient today.  TOC leadership notified.  10:45- CSW confirmed that the patient can discharge to the recovery home at [address], [place].  Attending notified.  Pending: d/c order  Expected Discharge Plan: [place] Barriers to Discharge: Continued Medical Work up, Homeless with medical needs, Family Issues, No SNF bed, Insurance Authorization   Patient Goals and CMS Choice Patient states their goals for this hospitalization and ongoing recovery are:: To go to rehab CMS Medicare.gov Compare Post Acute Care list provided to:: Patient Choice offered to / list presented to : Patient  Expected Discharge Plan and Services Expected Discharge Plan: [place]       Living arrangements for the past 2 months: Homeless                                      Prior Living Arrangements/Services Living arrangements for the past 2 months: Homeless Lives with:: Self Patient language and need for interpreter reviewed:: Yes Do you feel safe going back to the place where you live?: No   Homeless  Need for Family Participation in Patient Care: Yes (Comment) Care giver support system in place?: No (comment)   Criminal Activity/Legal Involvement Pertinent to Current Situation/Hospitalization: No - Comment as needed  Activities of Daily Living Home Assistive Devices/Equipment: None ADL Screening (condition at time of admission) Patient's cognitive ability adequate to safely complete daily activities?: Yes Is the patient deaf or have difficulty hearing?: No Does the patient have  difficulty seeing, even when wearing glasses/contacts?: Yes Does the patient have difficulty concentrating, remembering, or making decisions?: No Patient able to express need for assistance with ADLs?: Yes Does the patient have difficulty dressing or bathing?: No Independently performs ADLs?: Yes (appropriate for developmental age) Does the patient have difficulty walking or climbing stairs?: No Weakness of Legs: Both Weakness of Arms/Hands: None  Permission Sought/Granted Permission sought to share information with : Other (comment) (CSW) Permission granted to share information with : Yes, Verbal Permission Granted     Permission granted to share info w AGENCY: SNFs or shelters        Emotional Assessment Appearance:: Appears older than stated age Attitude/Demeanor/Rapport: Engaged Affect (typically observed): Adaptable Orientation: : Oriented to Situation, Oriented to Place, Oriented to Self Alcohol / Substance Use: Illicit Drugs Psych Involvement: No (comment)  Admission diagnosis:  Hyperkalemia [E87.5] CHF (congestive heart failure) (HCC) [I50.9] Uremia [N19] Patient Active Problem List   Diagnosis Date Noted   Hypertension    Paroxysmal atrial fibrillation (HCC)    Substance abuse (HCC)    Thrombocytopenia ([place]) 09/15/2021   Hypertensive urgency 09/13/2021   Uncontrolled hypertension    Uremia    Severe episode of recurrent major depressive disorder, without psychotic features ([place])    ESRD (end stage renal disease) on dialysis ([place]) 05/18/2021   Cardiac arrest ([place]) 04/03/2021   Anisocoria 04/03/2021   Acute on chronic diastolic CHF (congestive heart failure) ([place]) 04/03/2021  Hypothermia not due to cold exposure 04/03/2021   Junctional bradycardia 04/03/2021   Pancytopenia ([place]) 01/25/2021   AMS (altered mental status) 01/19/2021   Chronic viral hepatitis C ([place]) 12/25/2020   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 ([place]) 08/05/2020   Acute upper GI  bleed 65/68/1275   Acute metabolic encephalopathy 17/00/1749   COVID-19 virus infection 07/29/2020   AVM (arteriovenous malformation) of stomach, acquired with hemorrhage    AVM (arteriovenous malformation) of small bowel, acquired with hemorrhage    Current use of long term anticoagulation    Sensorineural hearing loss (SNHL) of both ears 03/30/2020   History of arteriovenous malformation (AVM) 01/14/2020   UGIB (upper gastrointestinal bleed) 01/14/2020   Acute blood loss anemia 01/14/2020   Elevated troponin 01/14/2020   Allergy, unspecified, initial encounter 04/29/2019   Gastroenteritis 12/24/2018   Substance use disorder 12/24/2018   Type 2 diabetes mellitus with other diabetic kidney complication ([place]) 44/96/7591   Noncompliance of patient with renal dialysis 09/12/2018   Benign neoplasm of colon    Melena    Anemia    H/O medication noncompliance    Polysubstance (excluding opioids) dependence (HCC)    Atrial flutter with rapid ventricular response ([place]) 08/10/2018   Chest pain 08/10/2018   PAF (paroxysmal atrial fibrillation) ([place]) 08/01/2018   Acute respiratory failure with hypoxia ([place]) 07/13/2018   Non-compliance with renal dialysis 07/13/2018   PAD (peripheral artery disease) ([place]) 04/11/2018   Sepsis ([place]) 03/31/2018   Preop cardiovascular exam 03/29/2018   Idiopathic gout, unspecified site 12/13/2017   Other specified complication of vascular prosthetic devices, implants and grafts, initial encounter ([place]) 11/03/2017   Volume overload 08/07/2017   Dyspnea 06/20/2017   Acute bronchitis    COPD (chronic obstructive pulmonary disease) ([place])    ESRD on hemodialysis ([place])    Hypoxia 12/05/2016   Hyperkalemia 12/19/2015   Hypoglycemia 12/19/2015   Polysubstance abuse ([place]) 12/19/2015   Homeless 12/19/2015   Anemia associated with stage 5 chronic renal failure ([place]) 12/19/2015   BPH without urinary obstruction 10/29/2015   Erectile dysfunction 10/29/2015    Hematuria, gross 10/29/2015   Hypercalcemia 11/07/2014   Pruritus, unspecified 10/17/2014   Coagulation defect, unspecified ([place]) 09/17/2014   Thrombocytopenia, unspecified ([place]) 10/17/2013   Iron deficiency anemia, unspecified 08/28/2013   Atherosclerosis of native arteries of extremity with intermittent claudication ([place]) 12/04/2012   ESRD (end stage renal disease) ([place]) 12/04/2012   Hypertensive chronic kidney disease with stage 5 chronic kidney disease or end stage renal disease ([place]) 11/20/2012   Secondary hyperparathyroidism of renal origin ([place]) 11/20/2012   HEPATITIS C 09/07/2006   HYPERCHOLESTEROLEMIA 09/07/2006   HYPERTENSION, BENIGN SYSTEMIC 09/07/2006   PCP:  [name], FNP [facility]acy:   [facility] [address] [place] [address] Phone: [phone] Fax: [place], [place] [address] Phone: [phone] Fax: [phone]     Social Determinants of Health (SDOH) Interventions    Readmission Risk Interventions    11/26/2021    5:07 PM 06/22/2020    3:40 PM  Readmission Risk Prevention Plan  Transportation Screening Complete Complete  Medication Review (RN Care Manager) Complete Complete  PCP or Specialist appointment within 3-5 days of discharge  Complete  HRI or Home Care Consult  Complete  SW Recovery Care/Counseling Consult Complete Complete  Palliative Care Screening Not  Applicable Not [place]  Patient Refused

## 2021-12-01 NOTE — TOC Progression Note (Addendum)
Transition of Care [facility]) - Progression Note    Patient Details  Name: ZACARI STIFF MRN: [id number] Date of Birth: [birth year]  Transition of Care [facility]) CM/SW Contact  Tom-Johnson, Renea Ee, RN Phone Number: 12/01/2021, 10:22 AM  Clinical Narrative:     CM spoke with patient about outpatient PT and he chose to go to [facility] on [address]. Order placed and info on AVS. Readmit risk assessment done.  Rollator ordered from [employer] and Maudie Mercury stated that patient received a rollator last January 2022 and not eligible for another one at [facility] time unless he pays out of pocket. Patient declined.  TOC will continue to follow with needs.  Expected Discharge Plan: [place] Barriers to Discharge: Continued Medical Work up, Homeless with medical needs, Family Issues, No SNF bed, Insurance Authorization  Expected Discharge Plan and Services Expected Discharge Plan: [place] arrangements for the past 2 months: Homeless                                       Social Determinants of Health (SDOH) Interventions    Readmission Risk Interventions    12/01/2021   10:21 AM 11/26/2021    5:07 PM 06/22/2020    3:40 PM  Readmission Risk Prevention Plan  Transportation Screening  Complete Complete  Medication Review Press [employer])  Complete Complete  PCP or Specialist appointment within 3-5 days of discharge Complete  Complete  HRI or [place] Complete  Complete  SW Recovery Care/Counseling Consult  Complete Complete  Palliative Care Screening  Not Applicable Not [place] Not Applicable  Patient Refused

## 2021-12-01 NOTE — Progress Notes (Signed)
AVS given and explained to patient. 

## 2021-12-01 NOTE — Progress Notes (Signed)
[facility] Progress Note   Subjective:   Patient seen and examined at bedside.  No specific complaints.  Denies bleeding, melena, CP, SOB, abdominal pain and n/v/d.  To d/c today per patient.   Objective Vitals:   11/30/21 1830 11/30/21 2243 12/01/21 0602 12/01/21 0942  BP: 115/72 109/63 131/73 101/65  Pulse: 69 63 62 73  Resp: '15 18 16 20  '$ Temp: 98.6 F (37 C) 98.1 F (36.7 C) 98.2 F (36.8 C) 99 F (37.2 C)  TempSrc: Oral Oral Oral Oral  SpO2: 100% 100% 99% 98%  Weight:      Height:       Physical Exam General:WDWN male in NAD Heart:RRR, no mrg Lungs:CTAB, nml WOB on RA Abdomen:soft, NTND   Extremities:No LE edema Dialysis Access: LU AVF in use   Filed Weights   11/30/21 0500 11/30/21 0817 11/30/21 1228  Weight: 70.1 kg 73.9 kg 68.9 kg    Intake/Output Summary (Last 24 hours) at 12/01/2021 0956 Last data filed at 12/01/2021 0519 Gross per 24 hour  Intake 1070 ml  Output 2119 ml  Net -1049 ml    Additional Objective Labs: Basic Metabolic Panel: Recent Labs  Lab 11/27/21 0410 11/30/21 0841  NA 136 136  K 4.6 4.6  CL 98 96*  CO2 30 28  GLUCOSE 80 105*  BUN 42* 64*  CREATININE 8.57* 12.37*  CALCIUM 9.0 9.3  PHOS  --  2.3*   Liver Function Tests: Recent Labs  Lab 11/30/21 0841  ALBUMIN 2.6*   CBC: Recent Labs  Lab 11/27/21 0410 11/30/21 0841 12/01/21 0514  WBC 3.6* 3.8* 3.4*  NEUTROABS 1.6* 1.9  --   HGB 7.6* 6.8* 7.9*  HCT 24.8* 22.1* 24.6*  MCV 98.8 99.1 95.7  PLT 197 169 152    Medications:  sodium chloride      sodium chloride   Intravenous Once   sodium chloride   Intravenous Once   sodium chloride   Intravenous Once   amiodarone  200 mg Oral Daily   amLODipine  5 mg Oral QHS   aspirin EC  81 mg Oral Daily   calcitRIOL  0.5 mcg Oral Q T,Th,Sa-HD   carvedilol  12.5 mg Oral BID   Chlorhexidine Gluconate Cloth  6 each Topical Q0600   Chlorhexidine Gluconate Cloth  6 each Topical Q0600   cinacalcet  180 mg Oral  Q T,Th,Sa-HD   darbepoetin (ARANESP) injection - DIALYSIS  200 mcg Intravenous Q Thu-HD   feeding supplement (NEPRO CARB STEADY)  237 mL Oral TID BM   fluticasone furoate-vilanterol  1 puff Inhalation Daily   folic acid  1 mg Oral Daily   heparin  5,000 Units Subcutaneous Q12H   multivitamin  1 tablet Oral QHS   pantoprazole  40 mg Oral BID AC   rosuvastatin  10 mg Oral Daily   sevelamer carbonate  4,000 mg Oral TID WC   sodium chloride flush  3 mL Intravenous Q12H   thiamine  100 mg Oral Daily   trimethoprim-polymyxin b  1 drop Right Eye Q4H   umeclidinium bromide  1 puff Inhalation Daily    Dialysis Orders: [facility] TTS 4h  400/1.5  68kg 2/2 bath  LFA AVF  Hep none   - mircera 236mg q2, last 4/13, was due 4/26  - venofer 50 q wk  - calcitriol 0.522m PO q HD   Assessment/Plan: Uremia - w/ azotemia, creat 28. Now resolved. Missed HD x 2 wks prior to admission. Pt  is homeless, see below. 2. Homelessness - major issue w/ HD pt, needs assistance with placement, SW/case manager aware - working on placement. Housing resources previously provided to patient in outpatient HD center. Plan to go to [facility] recovery home on d/c.  3. Fever: On admit, blood cultures NGTD. Afebrile. 4. ESRD - on HD TTS. HD tomorrow per regular schedule.  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  Per their notes  "Given the age of the fistula I would only offer revision if there were scabbing or ulcerations overlying aneurysmal areas."  No indication to re-consult at this time.  6. HTN/volume:  BP in goal.  Continue home meds - amlodipine '5mg'$  qd, coreg 12.'5mg'$  BID. Possible weight gain during admission.  Plan to increase EDW 69kg.   7. Atrial Fib - on Coreg. Eliquis has been on hold, possibly too high risk for bleed to resume which is appropriate. 8. Secondary HPTH: Ca/Phos ok, continue home binders/VDRA.  9. Anemia of ESRD: Hgb^7.9 s/p 1 unit  pRBC yesterday, 2U PRBCs given previously this admit. Continue Aranesp 2105mg q Thursday while here. S/p episode epistaxis this admit. 10. Nutrition: Alb low, continue supplements. Renal diet w/fluid restrictions. 186 Dispo- SW working on placement, pt reports he thinks he will be leaving on Thursday.  [facility] go to [facility] possibly later this week. 12. LE calf pain - LE doppler with no evidence of DVT or popliteal cyst  LJen Mow PA-C CS[place]5/24/2023,9:56 AM  LOS: 20 days

## 2021-12-01 NOTE — Discharge Summary (Signed)
Physician Discharge Summary   Patient: Raymond [name] MRN: [id number] DOB: Oct 26, 1955  Admit date:     11/11/2021  Discharge date: 12/01/21  Discharge Physician: Cordelia Poche, MD   PCP: [name], FNP   Recommendations at discharge:  PCP follow-up Repeat CBC in 3-5 days  Discharge Diagnoses: Principal Problem:   ESRD (end stage renal disease) on dialysis [facility]) Active Problems:   Acute on chronic diastolic CHF (congestive heart failure) ([place])   Acute metabolic encephalopathy   COPD (chronic obstructive pulmonary disease) ([place])   Hypertension   Paroxysmal atrial fibrillation ([place])   Substance abuse ([place])  Resolved Problems:   * No resolved hospital problems. *  Hospital Course: COBIN CADAVID is a 66 y.o. male with PMH significant for ESRD-HD-TTS, DM2, HTN, CAD, PAD, CHF, COPD, A-fib, chronic anemia, substance abuse, chronic hepatitis C Patient presented to the ED on 5/4 with complaint of chest pain, dyspnea after missing his dialysis for 2 weeks.   In the ED, he was volume overloaded with creatinine as high as 28, potassium high at 7.4, chest x-ray with pulmonary vascular congestion He was emergently dialyzed with subsequent improvement in volume status and electrolyte levels. His hospitalization was prolonged because of difficulty in placement.  Assessment and Plan:  ESRD Nephrology consulted. Patient receiving HD on a Tuesday, Thursday, Saturday schedule   Acute on chronic diastolic heart failure Volume overload Fluid management with HD. Resolved.   CAD PAD Hyperlipidemia Continue Crestor   COPD Stable. Continue Breo Ellipta   Paroxysmal atrial fibrillation Continue amiodarone   Anemia of chronic disease In setting of kidney disease. Patient has received a total of 3 units this admission. No evidence of acute bleed. Continue Aranesp per nephrology.   Substance abuse Counseled   Bilateral calf pain LE venous duplex scan was negative for acute or  chronic DVT.    Consultants: Nephrology Procedures performed: Hemodialysis  Disposition: Skilled nursing facility Diet recommendation:  Renal diet  DISCHARGE MEDICATION: Allergies as of 12/01/2021   No Known Allergies      Medication List     STOP taking these medications    mometasone-formoterol 200-5 MCG/ACT Aero Commonly known as: DULERA       TAKE these medications    acetaminophen 325 MG tablet Commonly known as: TYLENOL Take 2 tablets (650 mg total) by mouth every 6 (six) hours as needed for mild pain (or Fever >/= 101).   albuterol 108 (90 Base) MCG/ACT inhaler Commonly known as: VENTOLIN HFA Inhale 2 puffs into the lungs every 4 (four) hours as needed for shortness of breath or wheezing.   amiodarone 200 MG tablet Commonly known as: PACERONE Take 200 mg by mouth daily.   amLODipine 5 MG tablet Commonly known as: NORVASC Take 5 mg by mouth at bedtime.   Breo Ellipta 200-25 MCG/ACT Aepb Generic drug: fluticasone furoate-vilanterol Inhale 1 puff into the lungs daily.   calcitRIOL 0.5 MCG capsule Commonly known as: ROCALTROL Take 1 capsule (0.5 mcg total) by mouth Every Tuesday,Thursday,and Saturday with dialysis. Start taking on: Dec 02, 2021 What changed: how much to take   carvedilol 12.5 MG tablet Commonly known as: COREG Take 12.5 mg by mouth 2 (two) times daily.   cinacalcet 30 MG tablet Commonly known as: SENSIPAR Take 6 tablets (180 mg total) by mouth Every Tuesday,Thursday,and Saturday with dialysis.   Darbepoetin Alfa 200 MCG/0.4ML Sosy injection Commonly known as: ARANESP Inject 0.4 mLs (200 mcg total) into the vein every Tuesday with  hemodialysis.   feeding supplement (NEPRO CARB STEADY) Liqd Take 237 mLs by mouth 3 (three) times daily between meals.   folic acid 1 MG tablet Commonly known as: FOLVITE Take 1 tablet (1 mg total) by mouth daily.   multivitamin Tabs tablet Take 1 tablet by mouth at bedtime.   nicotine 14 mg/24hr  patch Commonly known as: NICODERM CQ - dosed in mg/24 hours Place 1 patch (14 mg total) onto the skin daily.   pantoprazole 40 MG tablet Commonly known as: PROTONIX Take 1 tablet (40 mg total) by mouth 2 (two) times daily before a meal.   polyethylene glycol powder 17 GM/SCOOP powder Commonly known as: GLYCOLAX/MIRALAX Take 17 g by mouth daily as needed for mild constipation.   rosuvastatin 10 MG tablet Commonly known as: CRESTOR Take 1 tablet (10 mg total) by mouth daily.   sevelamer carbonate 800 MG tablet Commonly known as: RENVELA Take 5 tablets (4,000 mg total) by mouth 3 (three) times daily with meals.   thiamine 100 MG tablet Take 1 tablet (100 mg total) by mouth daily.   umeclidinium bromide 62.5 MCG/ACT Aepb Commonly known as: INCRUSE ELLIPTA Inhale 1 puff into the lungs daily.        Follow-up Information     [name], FNP Follow up.   Specialty: Family Medicine Why: Follow up Contact information: [place] [address] [phone]         [facility] Follow up.   Specialty: Rehabilitation Why: Call to schedule first appointment. Contact information: [address] [phone] [place] [phone]               Discharge Exam: BP 101/65 (BP Location: Right Arm)   Pulse 73   Temp 99 F (37.2 C) (Oral)   Resp 20   Ht '5\' 8"'$  (1.727 m)   Wt 68.9 kg   SpO2 98%   BMI 23.10 kg/m   General exam: Appears calm and comfortable   Condition at discharge: stable  The results of significant diagnostics from this hospitalization (including imaging, microbiology, ancillary and laboratory) are listed below for reference.   Imaging Studies: DG Chest Portable 1 View  Result Date: 11/11/2021 CLINICAL DATA:  Chest pain. EXAM: PORTABLE CHEST 1 VIEW COMPARISON:  Chest XR, 10/14/2021.  CT chest, 08/10/2018. FINDINGS: Cardiomediastinal silhouette is enlarged.  Aortic aortic arch calcifications. RIGHT basilar nodularity is suspected to represent a nipple shadow. Trace streaky bibasilar opacities without focal consolidation. No pleural effusion or pneumothorax. No acute displaced fracture. IMPRESSION: 1. Cardiomegaly and minimal basilar atelectasis. 2.  Aortic Atherosclerosis (ICD10-I70.0). Electronically Signed   By: Michaelle Birks M.D.   On: 11/11/2021 10:15   VAS [place] LOWER EXTREMITY VENOUS (DVT)  Result Date: 11/29/2021  Lower Venous DVT Study Patient Name:  GURNOOR URSUA  Date of Exam:   11/28/2021 Medical Rec #: [id number]      Accession #:    [id number] Date of Birth: 23-Nov-1955      Patient Gender: M Patient Age:   44 years Exam Location:  [facility] Procedure:      VAS [place] LOWER EXTREMITY VENOUS (DVT) Referring Phys: Terrilee Croak --------------------------------------------------------------------------------  Indications: Swelling. Other Indications: Patient did not go to dialysis for 2 weeks. Limitations: Acoustic shadowing from arterial plaque. Comparison Study: Prior negative bilateral LEV done 04/05/2021 Performing Technologist: Sharion Dove RVS  Examination Guidelines: A complete evaluation includes B-mode imaging, spectral Doppler, color Doppler, and power Doppler  as needed of all accessible portions of each vessel. Bilateral testing is considered an integral part of a complete examination. Limited examinations for reoccurring indications may be performed as noted. The reflux portion of the exam is performed with the patient in reverse Trendelenburg.  +---------+---------------+---------+-----------+----------+--------------+ RIGHT    CompressibilityPhasicitySpontaneityPropertiesThrombus Aging +---------+---------------+---------+-----------+----------+--------------+ CFV      Full           Yes      Yes                                 +---------+---------------+---------+-----------+----------+--------------+ SFJ      Full                                                         +---------+---------------+---------+-----------+----------+--------------+ FV Prox  Full                                                        +---------+---------------+---------+-----------+----------+--------------+ FV Mid   Full                                                        +---------+---------------+---------+-----------+----------+--------------+ FV DistalFull                                                        +---------+---------------+---------+-----------+----------+--------------+ PFV      Full                                                        +---------+---------------+---------+-----------+----------+--------------+ POP      Full           Yes      Yes                                 +---------+---------------+---------+-----------+----------+--------------+ PTV      Full                                                        +---------+---------------+---------+-----------+----------+--------------+ PERO     Full                                                        +---------+---------------+---------+-----------+----------+--------------+   +---------+---------------+---------+-----------+----------+--------------+  LEFT     CompressibilityPhasicitySpontaneityPropertiesThrombus Aging +---------+---------------+---------+-----------+----------+--------------+ CFV      Full           Yes      Yes                                 +---------+---------------+---------+-----------+----------+--------------+ SFJ      Full                                                        +---------+---------------+---------+-----------+----------+--------------+ FV Prox  Full                                                        +---------+---------------+---------+-----------+----------+--------------+ FV Mid   Full                                                         +---------+---------------+---------+-----------+----------+--------------+ FV DistalFull                                                        +---------+---------------+---------+-----------+----------+--------------+ PFV      Full                                                        +---------+---------------+---------+-----------+----------+--------------+ POP      Full           Yes      Yes                                 +---------+---------------+---------+-----------+----------+--------------+ PTV      Full                                                        +---------+---------------+---------+-----------+----------+--------------+ PERO     Full                                                        +---------+---------------+---------+-----------+----------+--------------+     Summary: BILATERAL: - No evidence of deep vein thrombosis seen in the lower extremities, bilaterally. -No evidence of popliteal cyst, bilaterally.   *See table(s) above for measurements and observations. Electronically signed by Monica Martinez MD on 11/29/2021 at 5:10:49 PM.  Final     Microbiology: Results for orders placed or performed during the hospital encounter of 11/11/21  Culture, blood (Routine X 2) w Reflex to ID Panel     Status: None   Collection Time: 11/14/21  4:16 PM   Specimen: BLOOD RIGHT HAND  Result Value Ref Range Status   Specimen Description BLOOD RIGHT HAND  Final   Special Requests   Final    BOTTLES DRAWN AEROBIC ONLY Blood Culture results may not be optimal due to an inadequate volume of blood received in culture bottles   Culture   Final    NO GROWTH 5 DAYS Performed at [facility], [place] [address], [place], [place] [address]    Report Status 11/19/2021 FINAL  Final  Culture, blood (Routine X 2) w Reflex to ID Panel     Status: None   Collection Time: 11/14/21  4:16 PM   Specimen: BLOOD RIGHT WRIST  Result Value Ref Range Status    Specimen Description BLOOD RIGHT WRIST  Final   Special Requests   Final    BOTTLES DRAWN AEROBIC AND ANAEROBIC Blood Culture results may not be optimal due to an inadequate volume of blood received in culture bottles   Culture   Final    NO GROWTH 5 DAYS Performed at [facility], [place] [address] [place], [place] [address]    Report Status 11/19/2021 FINAL  Final    Labs: CBC: Recent Labs  Lab 11/27/21 0410 11/30/21 0841 12/01/21 0514  WBC 3.6* 3.8* 3.4*  NEUTROABS 1.6* 1.9  --   HGB 7.6* 6.8* 7.9*  HCT 24.8* 22.1* 24.6*  MCV 98.8 99.1 95.7  PLT 197 169 944   Basic Metabolic Panel: Recent Labs  Lab 11/27/21 0410 11/30/21 0841  NA 136 136  K 4.6 4.6  CL 98 96*  CO2 30 28  GLUCOSE 80 105*  BUN 42* 64*  CREATININE 8.57* 12.37*  CALCIUM 9.0 9.3  PHOS  --  2.3*   Liver Function Tests: Recent Labs  Lab 11/30/21 0841  ALBUMIN 2.6*   Discharge time spent: 35 minutes.  Signed: Cordelia Poche, MD [facility] 12/01/2021

## 2021-12-01 NOTE — Progress Notes (Addendum)
Advised pt for d/c today. Spoke to pt via phone to inquire if pt would be agreeable to navigator calling medicaid transportation to provide the address that pt will be going to at d/c ([address] [place]). Need to ensure that pt has transportation to out-pt HD tomorrow. Pt agreeable to navigator calling medicaid transportation. Contacted medicaid transportation and spoke to April. Pt's address updated to above address but was informed that pt's certification has expired and pt needs to complete a new assessment prior to being able to schedule a trip. Medicaid transportation provided pt's hospital room phone number to speak to pt today prior to d/c (pt does not currently have a cell phone) to complete assessment. Once assessment is completed, trip for tomorrow can be booked. Spoke to pt via phone to advise him that he needs to complete assessment today and that DSS will be calling his room phone and to please answer. Will update TOC staff.   Melven Sartorius Renal Navigator [phone]  Addendum at 2:57 pm: Pt completed transportation assessment with DSS. Contacted [place] [employer] transportation to schedule trip for tomorrow. Spoke to [place] to schedule appt. Since pt does not have a phone for transportation to call regarding pick-up time, inquired if there was a time pt should be told to be ready by in the morning. Advised to tell pt 10:00. Pt's case discussed with Durenda Guthrie as well due to [facility] contacting navigator regarding pt's case. Met with pt at bedside. Advised pt transportation arranged for tomorrow but pt will need to call to make appts for future HD appts. Pt states clinic will likely assist him with that. Pt aware he needs to be ready by 10:00 or earlier if possible. Pt voices understanding. Spoke to [place] at [facility] to make clinic aware of pt's d/c today and that pt will resume tomorrow. Clinic aware that transportation has been arranged for tomorrow only.

## 2021-12-01 NOTE — Progress Notes (Signed)
Physical Therapy Treatment & Discharge Patient Details Name: Raymond [name] MRN: [id number] DOB: 09/05/1955 Today's Date: 12/01/2021   History of Present Illness 66 yo male presented to ED on 11/11/21 with dyspnea and chest pain. Apparently patient has missed 2 weeks of hemodialysis. Patient underwent emergent hemodialysis with ultrafiltration 3,5 L with improvement in volume status but not back to baseline; working diagnosis of volume overload in the setting of ESRD. PMH: ESRD on HDU TTS, COPD, hep C, HTN, CAD, PAD, COPD, crack cocaine dependence, A-fib, and medical noncompliance, hx of PEA arrest    PT Comments    Patient has met 3/3 goals and currently functioning at modI level with use of RW. Patient continues to have L LE pain during mobility and weightbearing but seems improved from previous session. Patient will benefit from any AD to assist with offloading L LE. No further skilled PT needs identified. D/c plan remains appropriate. PT will sign off.    Recommendations for follow up therapy are one component of a multi-disciplinary discharge planning process, led by the attending physician.  Recommendations may be updated based on patient status, additional functional criteria and insurance authorization.  Follow Up Recommendations  Outpatient PT     Assistance Recommended at Discharge Intermittent Supervision/Assistance  Patient can return home with the following Help with stairs or ramp for entrance;Assist for transportation;A little help with walking and/or transfers   Equipment Recommendations  Rollator (4 wheels)    Recommendations for Other Services       Precautions / Restrictions Precautions Precautions: Fall Restrictions Weight Bearing Restrictions: No     Mobility  Bed Mobility Overal bed mobility: Modified Independent                  Transfers Overall transfer level: Modified independent Equipment used: Rolling Mylasia Vorhees (2 wheels)                     Ambulation/Gait Ambulation/Gait assistance: Modified independent (Device/Increase time) Gait Distance (Feet): 500 Feet Assistive device: Rolling Kiante Ciavarella (2 wheels) Gait Pattern/deviations: Step-through pattern, Decreased stride length, Decreased stance time - left Gait velocity: decreased     General Gait Details: continues to have decreased stance time on L due to pain but improved from previous sessions   Stairs Stairs: Yes Stairs assistance: Modified independent (Device/Increase time) Stair Management: Two rails, Step to pattern, Forwards Number of Stairs: 5 General stair comments: increased time to complete and use of rails   Wheelchair Mobility    Modified Rankin (Stroke Patients Only)       Balance Overall balance assessment: Needs assistance Sitting-balance support: No upper extremity supported, Feet supported Sitting balance-Leahy Scale: Normal     Standing balance support: Bilateral upper extremity supported, During functional activity Standing balance-Leahy Scale: Fair                              Cognition Arousal/Alertness: Awake/alert Behavior During Therapy: WFL for tasks assessed/performed Overall Cognitive Status: Within Functional Limits for tasks assessed                                          Exercises      General Comments        Pertinent Vitals/Pain Pain Assessment Pain Assessment: 0-10 Pain Score: 6  Pain Location: LLE Pain Descriptors / Indicators: Pressure,  Aching, Discomfort Pain Intervention(s): Monitored during session, Repositioned    Home Living                          Prior Function            PT Goals (current goals can now be found in the care plan section) Acute Rehab PT Goals PT Goal Formulation: With patient Time For Goal Achievement: 12/01/21 Potential to Achieve Goals: Good Progress towards PT goals: Goals met/education completed, patient discharged from PT     Frequency    Min 2X/week      PT Plan Current plan remains appropriate    Co-evaluation              AM-PAC PT "6 Clicks" Mobility   Outcome Measure  Help needed turning from your back to your side while in a flat bed without using bedrails?: None Help needed moving from lying on your back to sitting on the side of a flat bed without using bedrails?: None Help needed moving to and from a bed to a chair (including a wheelchair)?: None Help needed standing up from a chair using your arms (e.g., wheelchair or bedside chair)?: None Help needed to walk in hospital room?: None Help needed climbing 3-5 steps with a railing? : None 6 Click Score: 24    End of Session   Activity Tolerance: Patient tolerated treatment well Patient left: in bed;with call bell/phone within reach Nurse Communication: Mobility status PT Visit Diagnosis: Unsteadiness on feet (R26.81);Other abnormalities of gait and mobility (R26.89);Pain;Muscle weakness (generalized) (M62.81) Pain - Right/Left: Left Pain - part of body: Leg     Time: 2355-7322 PT Time Calculation (min) (ACUTE ONLY): 19 min  Charges:  $Gait Training: 8-22 mins                     Laelah Siravo A. Dan Humphreys PT, DPT Acute Rehabilitation Services Pager [phone] Office [phone]    Viviann Spare 12/01/2021, 2:28 PM

## 2021-12-02 ENCOUNTER — Telehealth: Payer: Self-pay | Admitting: Nephrology

## 2021-12-02 NOTE — Telephone Encounter (Signed)
Transition of Care Contact from [facility]  Date of Discharge: 12/01/21 Date of Contact: 12/01/21 Method of contact: phone - attempted  Attempted to contact patient to discuss transition of care from inpatient admission.  Patient did not answer the phone.  Unable to leave a message. Will follow up at dialysis.  Jen Mow, PA-C [employer]

## 2021-12-08 ENCOUNTER — Telehealth: Payer: Self-pay

## 2021-12-08 NOTE — Telephone Encounter (Signed)
NOTES SCANNED TO REFERRAL 

## 2021-12-20 ENCOUNTER — Emergency Department (HOSPITAL_COMMUNITY): Payer: Medicare Other

## 2021-12-20 ENCOUNTER — Inpatient Hospital Stay (HOSPITAL_COMMUNITY)
Admission: EM | Admit: 2021-12-20 | Discharge: 2021-12-22 | DRG: 640 | Disposition: A | Discharge status: Home / Self Care | Payer: Medicare Other | Attending: Internal Medicine | Admitting: Internal Medicine

## 2021-12-20 ENCOUNTER — Other Ambulatory Visit: Payer: Self-pay

## 2021-12-20 ENCOUNTER — Encounter (HOSPITAL_COMMUNITY): Payer: Self-pay | Admitting: Internal Medicine

## 2021-12-20 DIAGNOSIS — E875 Hyperkalemia: Secondary | ICD-10-CM | POA: Diagnosis present

## 2021-12-20 DIAGNOSIS — Z833 Family history of diabetes mellitus: Secondary | ICD-10-CM

## 2021-12-20 DIAGNOSIS — E1129 Type 2 diabetes mellitus with other diabetic kidney complication: Secondary | ICD-10-CM | POA: Diagnosis present

## 2021-12-20 DIAGNOSIS — E785 Hyperlipidemia, unspecified: Secondary | ICD-10-CM | POA: Diagnosis present

## 2021-12-20 DIAGNOSIS — Z992 Dependence on renal dialysis: Secondary | ICD-10-CM

## 2021-12-20 DIAGNOSIS — D631 Anemia in chronic kidney disease: Secondary | ICD-10-CM | POA: Diagnosis present

## 2021-12-20 DIAGNOSIS — Z91158 Patient's noncompliance with renal dialysis for other reason: Secondary | ICD-10-CM

## 2021-12-20 DIAGNOSIS — R7989 Other specified abnormal findings of blood chemistry: Secondary | ICD-10-CM

## 2021-12-20 DIAGNOSIS — R0609 Other forms of dyspnea: Principal | ICD-10-CM

## 2021-12-20 DIAGNOSIS — I2510 Atherosclerotic heart disease of native coronary artery without angina pectoris: Secondary | ICD-10-CM | POA: Diagnosis present

## 2021-12-20 DIAGNOSIS — I10 Essential (primary) hypertension: Secondary | ICD-10-CM | POA: Diagnosis present

## 2021-12-20 DIAGNOSIS — E8770 Fluid overload, unspecified: Secondary | ICD-10-CM | POA: Diagnosis not present

## 2021-12-20 DIAGNOSIS — I480 Paroxysmal atrial fibrillation: Secondary | ICD-10-CM | POA: Diagnosis present

## 2021-12-20 DIAGNOSIS — F17210 Nicotine dependence, cigarettes, uncomplicated: Secondary | ICD-10-CM | POA: Diagnosis present

## 2021-12-20 DIAGNOSIS — E1151 Type 2 diabetes mellitus with diabetic peripheral angiopathy without gangrene: Secondary | ICD-10-CM | POA: Diagnosis present

## 2021-12-20 DIAGNOSIS — Z79899 Other long term (current) drug therapy: Secondary | ICD-10-CM

## 2021-12-20 DIAGNOSIS — M898X9 Other specified disorders of bone, unspecified site: Secondary | ICD-10-CM | POA: Diagnosis present

## 2021-12-20 DIAGNOSIS — Z7901 Long term (current) use of anticoagulants: Secondary | ICD-10-CM

## 2021-12-20 DIAGNOSIS — E1122 Type 2 diabetes mellitus with diabetic chronic kidney disease: Secondary | ICD-10-CM | POA: Diagnosis present

## 2021-12-20 DIAGNOSIS — I120 Hypertensive chronic kidney disease with stage 5 chronic kidney disease or end stage renal disease: Secondary | ICD-10-CM | POA: Diagnosis present

## 2021-12-20 DIAGNOSIS — F1910 Other psychoactive substance abuse, uncomplicated: Secondary | ICD-10-CM | POA: Diagnosis present

## 2021-12-20 DIAGNOSIS — N289 Disorder of kidney and ureter, unspecified: Secondary | ICD-10-CM | POA: Diagnosis not present

## 2021-12-20 DIAGNOSIS — J449 Chronic obstructive pulmonary disease, unspecified: Secondary | ICD-10-CM | POA: Diagnosis present

## 2021-12-20 DIAGNOSIS — Z8249 Family history of ischemic heart disease and other diseases of the circulatory system: Secondary | ICD-10-CM

## 2021-12-20 DIAGNOSIS — N186 End stage renal disease: Secondary | ICD-10-CM | POA: Diagnosis present

## 2021-12-20 LAB — BASIC METABOLIC PANEL
Anion gap: 15 (ref 5–15)
BUN: 74 mg/dL — ABNORMAL HIGH (ref 8–23)
CO2: 27 mmol/L (ref 22–32)
Calcium: 10 mg/dL (ref 8.9–10.3)
Chloride: 98 mmol/L (ref 98–111)
Creatinine, Ser: 12.79 mg/dL — ABNORMAL HIGH (ref 0.61–1.24)
GFR, Estimated: 4 mL/min — ABNORMAL LOW (ref >60)
Glucose, Bld: 84 mg/dL (ref 70–99)
Potassium: >7.5 mmol/L (ref 3.5–5.1)
Sodium: 140 mmol/L (ref 135–145)

## 2021-12-20 LAB — BRAIN NATRIURETIC PEPTIDE: B Natriuretic Peptide: 2438 pg/mL — ABNORMAL HIGH (ref 0.0–100.0)

## 2021-12-20 LAB — CBC
HCT: 26.1 % — ABNORMAL LOW (ref 39.0–52.0)
Hemoglobin: 7.8 g/dL — ABNORMAL LOW (ref 13.0–17.0)
MCH: 31.5 pg (ref 26.0–34.0)
MCHC: 29.9 g/dL — ABNORMAL LOW (ref 30.0–36.0)
MCV: 105.2 fL — ABNORMAL HIGH (ref 80.0–100.0)
Platelets: 182 10*3/uL (ref 150–400)
RBC: 2.48 MIL/uL — ABNORMAL LOW (ref 4.22–5.81)
RDW: 19.7 % — ABNORMAL HIGH (ref 11.5–15.5)
WBC: 3.9 10*3/uL — ABNORMAL LOW (ref 4.0–10.5)
nRBC: 0 % (ref 0.0–0.2)

## 2021-12-20 LAB — TROPONIN I (HIGH SENSITIVITY)
Troponin I (High Sensitivity): 30 ng/L — ABNORMAL HIGH (ref <18)
Troponin I (High Sensitivity): 28 ng/L — ABNORMAL HIGH (ref <18)

## 2021-12-20 IMAGING — CR DG CHEST 2V
2 series · 2 of 2 positions shown · non-contrast
Comparison: Radiographs [DATE] and [DATE].  CT [DATE].

CLINICAL DATA: Chest pain with shortness of breath since earlier
today. Lethargy. History of diabetes and hemodialysis.

EXAM:
CHEST - 2 VIEW

[chest lat]
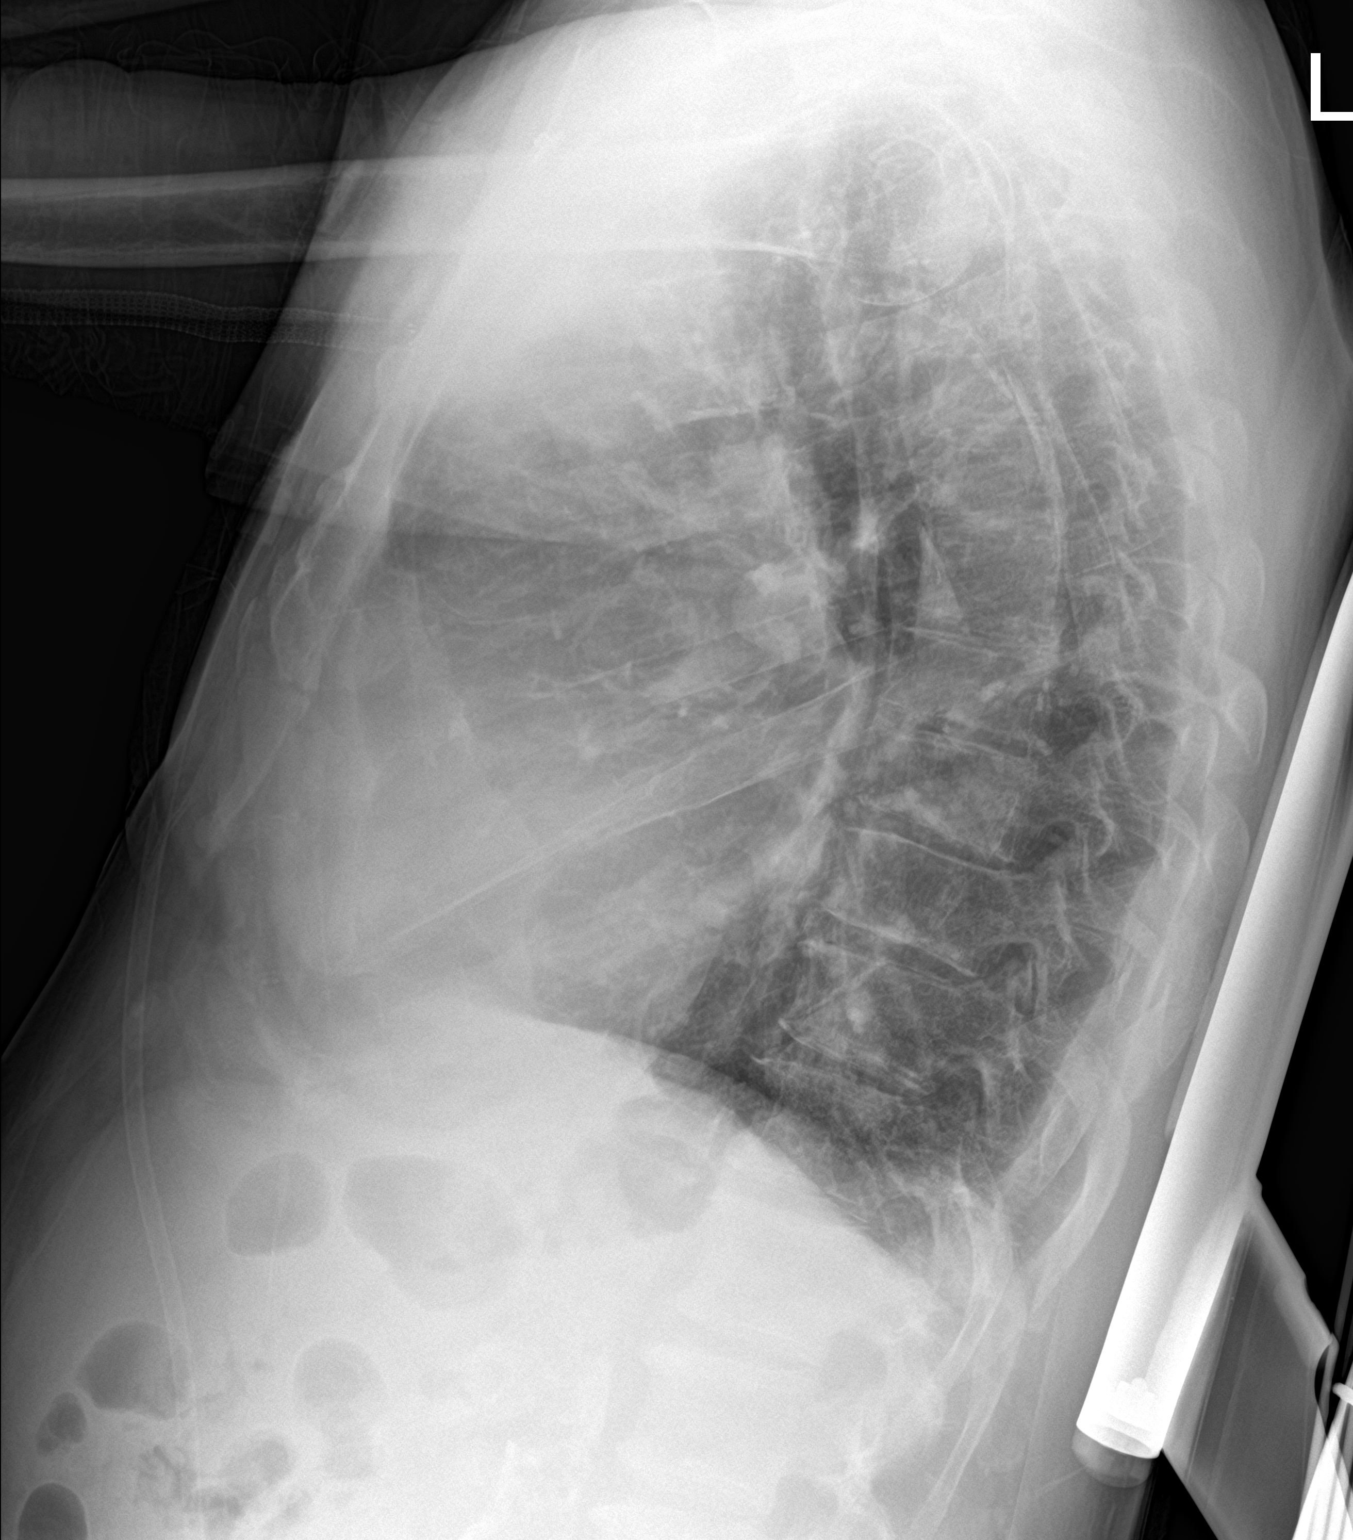

[chest ap]
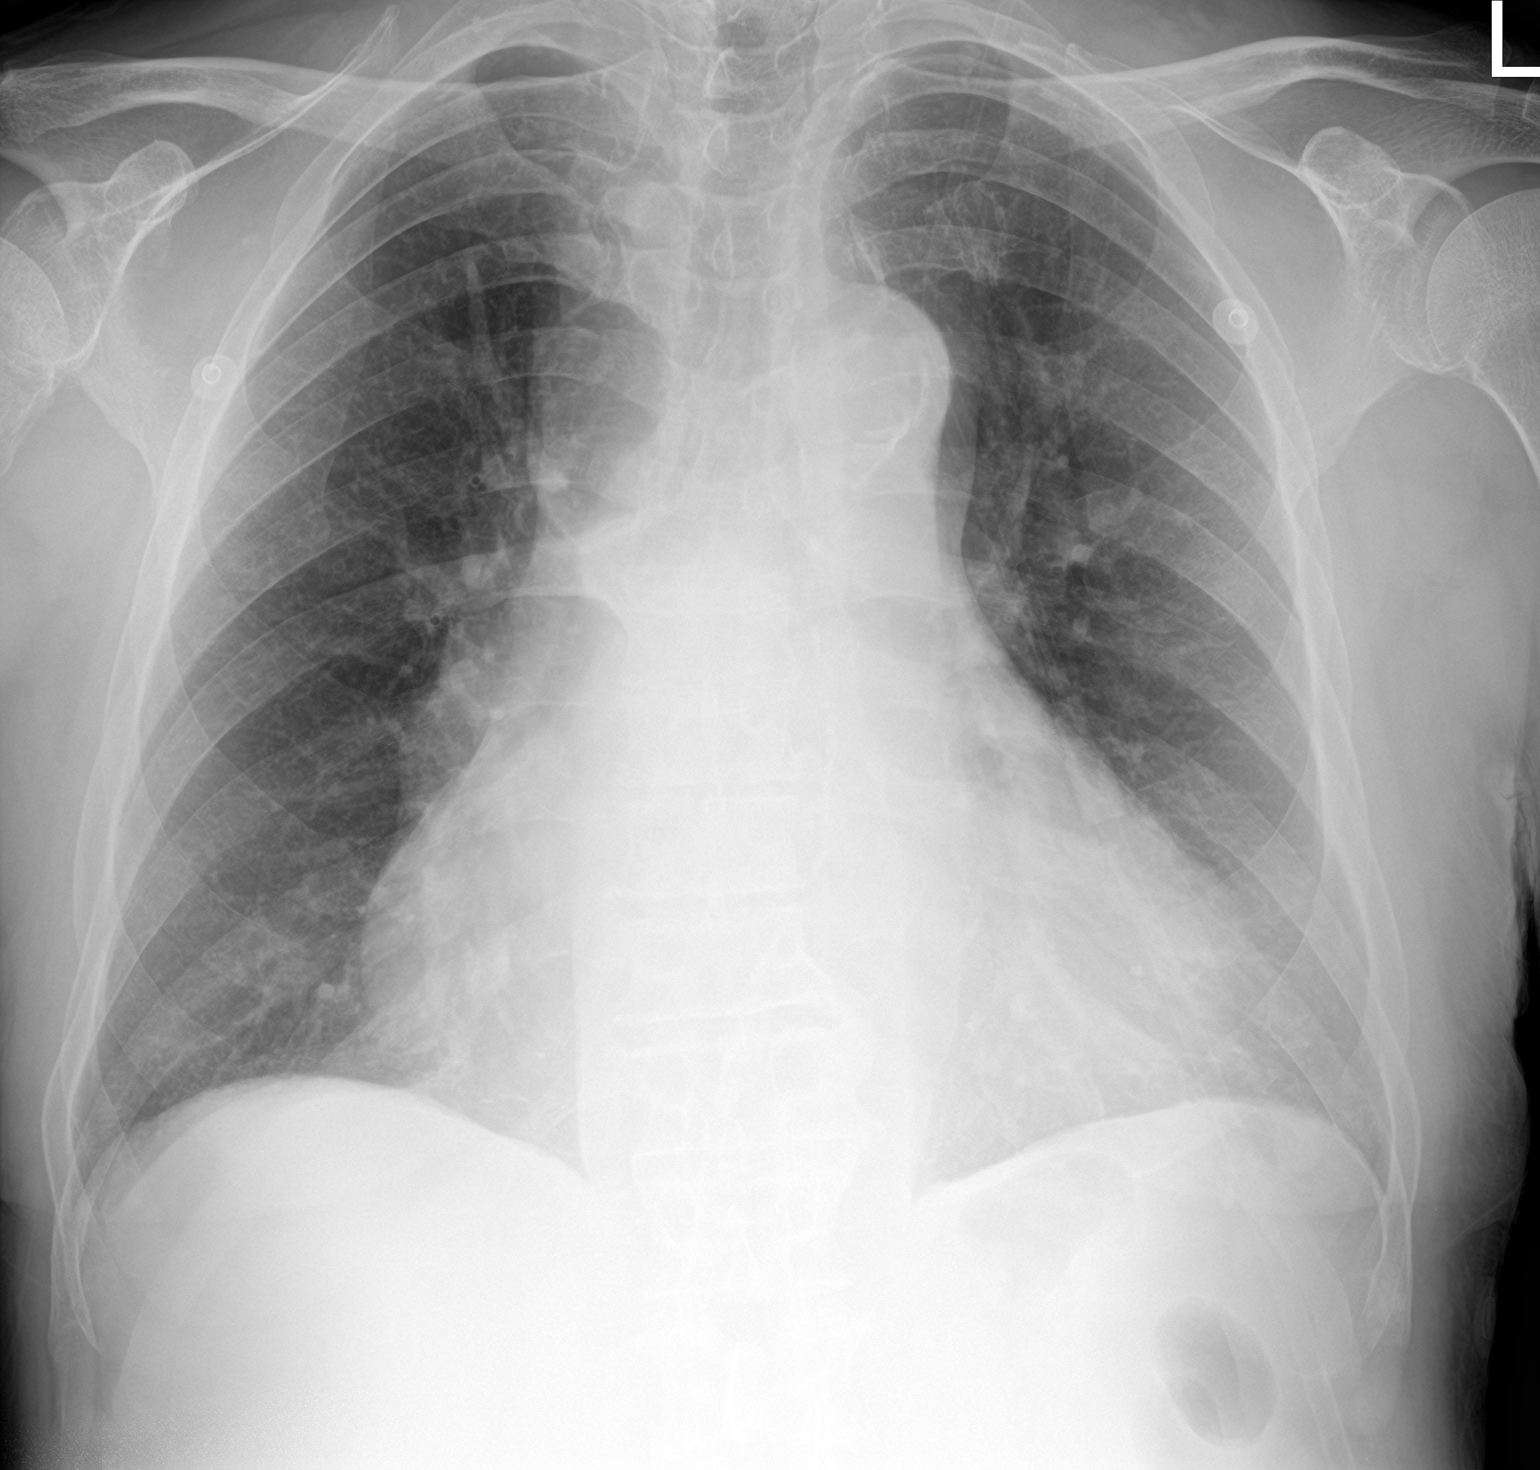

[2 of 2 positions shown; findings below may reference images not displayed]

FINDINGS: Stable moderate enlargement of the cardiac silhouette with aortic
atherosclerosis. The lungs appear clear. There is no pleural
effusion or pneumothorax. No acute osseous findings are evident.
Telemetry leads overlie the chest.
IMPRESSION: No evidence of acute cardiopulmonary process. Stable moderate
cardiomegaly.

## 2021-12-20 MED ORDER — CHLORHEXIDINE GLUCONATE CLOTH 2 % EX PADS
6.0000 | MEDICATED_PAD | Freq: Every day | CUTANEOUS | Status: DC
  Administered 2021-12-21 – 2021-12-22 (×2): 6 via TOPICAL

## 2021-12-20 MED ORDER — CINACALCET HCL 30 MG PO TABS
180.0000 mg | ORAL_TABLET | ORAL | Status: DC
  Administered 2021-12-21: 180 mg via ORAL
  Filled 2021-12-20 (×2): qty 6

## 2021-12-20 MED ORDER — ALBUTEROL SULFATE (2.5 MG/3ML) 0.083% IN NEBU
10.0000 mg | INHALATION_SOLUTION | Freq: Once | RESPIRATORY_TRACT | Status: AC
  Administered 2021-12-20: 10 mg via RESPIRATORY_TRACT
  Filled 2021-12-20: qty 12

## 2021-12-20 MED ORDER — HYDRALAZINE HCL 20 MG/ML IJ SOLN: 5.0000 mg | INTRAMUSCULAR | Status: DC | PRN

## 2021-12-20 MED ORDER — FLUTICASONE FUROATE-VILANTEROL 200-25 MCG/ACT IN AEPB
2.0000 | INHALATION_SPRAY | Freq: Every day | RESPIRATORY_TRACT | Status: DC
Start: 2021-12-20 — End: 2021-12-20

## 2021-12-20 MED ORDER — SEVELAMER CARBONATE 800 MG PO TABS
4000.0000 mg | ORAL_TABLET | ORAL | Status: DC
Start: 2021-12-20 — End: 2021-12-20

## 2021-12-20 MED ORDER — ACETAMINOPHEN 325 MG PO TABS
650.0000 mg | ORAL_TABLET | Freq: Four times a day (QID) | ORAL | Status: DC | PRN
  Filled 2021-12-20: qty 2

## 2021-12-20 MED ORDER — SEVELAMER CARBONATE 800 MG PO TABS
4000.0000 mg | ORAL_TABLET | Freq: Two times a day (BID) | ORAL | Status: DC
  Administered 2021-12-21 – 2021-12-22 (×3): 4000 mg via ORAL
  Filled 2021-12-20 (×3): qty 5

## 2021-12-20 MED ORDER — DEXTROSE 50 % IV SOLN
1.0000 | Freq: Once | INTRAVENOUS | Status: AC
  Administered 2021-12-20: 50 mL via INTRAVENOUS
  Filled 2021-12-20: qty 50

## 2021-12-20 MED ORDER — ZOLPIDEM TARTRATE 5 MG PO TABS
5.0000 mg | ORAL_TABLET | Freq: Every evening | ORAL | Status: DC | PRN
  Administered 2021-12-21: 5 mg via ORAL
  Filled 2021-12-20: qty 1

## 2021-12-20 MED ORDER — FUROSEMIDE 10 MG/ML IJ SOLN
40.0000 mg | Freq: Once | INTRAMUSCULAR | Status: DC
  Filled 2021-12-20: qty 4

## 2021-12-20 MED ORDER — ONDANSETRON HCL 4 MG/2ML IJ SOLN: 4.0000 mg | Freq: Four times a day (QID) | INTRAMUSCULAR | Status: DC | PRN

## 2021-12-20 MED ORDER — CAMPHOR-MENTHOL 0.5-0.5 % EX LOTN
1.0000 "application " | TOPICAL_LOTION | Freq: Three times a day (TID) | CUTANEOUS | Status: DC | PRN
  Filled 2021-12-20: qty 222

## 2021-12-20 MED ORDER — NEPRO/CARBSTEADY PO LIQD
237.0000 mL | Freq: Three times a day (TID) | ORAL | Status: DC | PRN
Start: 2021-12-20 — End: 2021-12-22
  Filled 2021-12-20: qty 237

## 2021-12-20 MED ORDER — ALBUTEROL SULFATE (2.5 MG/3ML) 0.083% IN NEBU: 2.5000 mg | INHALATION_SOLUTION | RESPIRATORY_TRACT | Status: DC | PRN

## 2021-12-20 MED ORDER — ROSUVASTATIN CALCIUM 5 MG PO TABS
10.0000 mg | ORAL_TABLET | Freq: Every day | ORAL | Status: DC
  Administered 2021-12-21 – 2021-12-22 (×2): 10 mg via ORAL
  Filled 2021-12-20: qty 2

## 2021-12-20 MED ORDER — CALCIUM CARBONATE ANTACID 1250 MG/5ML PO SUSP
500.0000 mg | Freq: Four times a day (QID) | ORAL | Status: DC | PRN
Start: 2021-12-20 — End: 2021-12-22
  Filled 2021-12-20: qty 5

## 2021-12-20 MED ORDER — CARVEDILOL 12.5 MG PO TABS
12.5000 mg | ORAL_TABLET | Freq: Two times a day (BID) | ORAL | Status: DC
  Administered 2021-12-20 – 2021-12-22 (×4): 12.5 mg via ORAL
  Filled 2021-12-20 (×4): qty 1

## 2021-12-20 MED ORDER — SODIUM CHLORIDE 0.9% FLUSH
3.0000 mL | Freq: Two times a day (BID) | INTRAVENOUS | Status: DC
  Administered 2021-12-20 – 2021-12-22 (×4): 3 mL via INTRAVENOUS

## 2021-12-20 MED ORDER — CALCITRIOL 0.5 MCG PO CAPS
0.5000 ug | ORAL_CAPSULE | ORAL | Status: DC
  Administered 2021-12-21: 0.5 ug via ORAL
  Filled 2021-12-20 (×2): qty 1

## 2021-12-20 MED ORDER — SERTRALINE HCL 50 MG PO TABS
25.0000 mg | ORAL_TABLET | Freq: Every day | ORAL | Status: DC
  Administered 2021-12-21 – 2021-12-22 (×2): 25 mg via ORAL
  Filled 2021-12-20 (×2): qty 1

## 2021-12-20 MED ORDER — SORBITOL 70 % SOLN
30.0000 mL | Status: DC | PRN
  Filled 2021-12-20: qty 30

## 2021-12-20 MED ORDER — HYDROXYZINE HCL 25 MG PO TABS: 25.0000 mg | ORAL_TABLET | Freq: Three times a day (TID) | ORAL | Status: DC | PRN

## 2021-12-20 MED ORDER — FLUTICASONE FUROATE-VILANTEROL 200-25 MCG/ACT IN AEPB
2.0000 | INHALATION_SPRAY | Freq: Every day | RESPIRATORY_TRACT | Status: DC
  Filled 2021-12-20: qty 28

## 2021-12-20 MED ORDER — PANTOPRAZOLE SODIUM 40 MG PO TBEC
40.0000 mg | DELAYED_RELEASE_TABLET | Freq: Every day | ORAL | Status: DC
  Administered 2021-12-21 – 2021-12-22 (×2): 40 mg via ORAL
  Filled 2021-12-20 (×2): qty 1

## 2021-12-20 MED ORDER — SODIUM ZIRCONIUM CYCLOSILICATE 10 G PO PACK
10.0000 g | PACK | Freq: Once | ORAL | Status: AC
  Administered 2021-12-20: 10 g via ORAL
  Filled 2021-12-20: qty 1

## 2021-12-20 MED ORDER — NICOTINE 14 MG/24HR TD PT24
14.0000 mg | MEDICATED_PATCH | Freq: Every day | TRANSDERMAL | Status: DC
Start: 2021-12-20 — End: 2021-12-22
  Administered 2021-12-22: 14 mg via TRANSDERMAL
  Filled 2021-12-20 (×2): qty 1

## 2021-12-20 MED ORDER — INSULIN ASPART 100 UNIT/ML IV SOLN
5.0000 [IU] | Freq: Once | INTRAVENOUS | Status: AC
  Administered 2021-12-20: 5 [IU] via INTRAVENOUS

## 2021-12-20 MED ORDER — ONDANSETRON HCL 4 MG PO TABS: 4.0000 mg | ORAL_TABLET | Freq: Four times a day (QID) | ORAL | Status: DC | PRN

## 2021-12-20 MED ORDER — ACETAMINOPHEN 650 MG RE SUPP: 650.0000 mg | Freq: Four times a day (QID) | RECTAL | Status: DC | PRN

## 2021-12-20 MED ORDER — HEPARIN SODIUM (PORCINE) 5000 UNIT/ML IJ SOLN
5000.0000 [IU] | Freq: Three times a day (TID) | INTRAMUSCULAR | Status: DC
  Administered 2021-12-20 – 2021-12-22 (×6): 5000 [IU] via SUBCUTANEOUS
  Filled 2021-12-20 (×5): qty 1

## 2021-12-20 MED ORDER — AMLODIPINE BESYLATE 5 MG PO TABS
5.0000 mg | ORAL_TABLET | Freq: Every day | ORAL | Status: DC
  Administered 2021-12-21 – 2021-12-22 (×2): 5 mg via ORAL
  Filled 2021-12-20 (×2): qty 1

## 2021-12-20 MED ORDER — AMIODARONE HCL 200 MG PO TABS
200.0000 mg | ORAL_TABLET | Freq: Every day | ORAL | Status: DC
  Administered 2021-12-21 – 2021-12-22 (×2): 200 mg via ORAL
  Filled 2021-12-20 (×2): qty 1

## 2021-12-20 MED ORDER — DOCUSATE SODIUM 283 MG RE ENEM
1.0000 | ENEMA | RECTAL | Status: DC | PRN
  Filled 2021-12-20: qty 1

## 2021-12-20 MED ORDER — ALBUTEROL SULFATE HFA 108 (90 BASE) MCG/ACT IN AERS: 3.0000 | INHALATION_SPRAY | Freq: Every day | RESPIRATORY_TRACT | Status: DC | PRN

## 2021-12-20 MED ORDER — SODIUM BICARBONATE 8.4 % IV SOLN
50.0000 meq | Freq: Once | INTRAVENOUS | Status: AC
  Administered 2021-12-20: 50 meq via INTRAVENOUS
  Filled 2021-12-20: qty 50

## 2021-12-20 MED ORDER — RENA-VITE PO TABS
1.0000 | ORAL_TABLET | Freq: Every day | ORAL | Status: DC
Start: 2021-12-20 — End: 2021-12-22
  Administered 2021-12-20 – 2021-12-21 (×2): 1 via ORAL
  Filled 2021-12-20 (×3): qty 1

## 2021-12-20 MED ORDER — CALCIUM GLUCONATE 10 % IV SOLN
1.0000 g | Freq: Once | INTRAVENOUS | Status: AC
  Administered 2021-12-20: 1 g via INTRAVENOUS
  Filled 2021-12-20: qty 10

## 2021-12-20 NOTE — H&P (Signed)
History and Physical    PatientMarland [name] RENALDO [name] IRS:[id number] DOB: [birth year] DOA: 12/20/2021 DOS: the patient was seen and examined on 12/20/2021 PCP: [name], FNP  Patient coming from: Home - lives in rehab house, [facility]; NOK: Domin[place], [place], [phone]   Chief Complaint: SOB  HPI: Raymond [name] is a 66 y.o. male with medical history significant of CAD; COPD; ESRD on TTS HD; Hep C; HTN; DM; and polysubstance abuse presenting with SOB.   He is here with SOB starting last night.  It started about 6pm, wasn't doing anything.  He dozes off to sleep and he was SOB.  He couldn't get any sleep.  Last HD was Saturday and it was not normal - his O2 went to 72 and he was started on O2.  He had to get off early "because I was going blind because of my cataracts", did 3 hours.    ER Course:  DOE, likely CHF.  BNP 2400, troponin mildly chronically elevated.  K > 7.5, not hemolyzed.  Getting treatment, will get HD soon.  Usually TTS, normal HD Saturday.     Review of Systems: As mentioned in the history of present illness. All other systems reviewed and are negative. Past Medical History:  Diagnosis Date   Anemia    CAD (coronary artery disease)    a. 03/2018: cath showing 19 to 30% stenosis along the LAD, luminal irregularities along the RCA, and angiographically normal LCx   COPD (chronic obstructive pulmonary disease) (HCC)    ESRD (end stage renal disease) on dialysis [facility])    "MWF; Jeneen Rinks" (07/22/17)   Hepatitis C    Still positive s/p liver biopsy at [facility]  and interferon therapy for 6 months. Most recent lab work was on 10/24/12   Hepatitis C    "took the tx; gone now" (12/05/2016)  Was treated   History of blood transfusion ~ 2012/2013   "related to my kidneys; blood was low"   Hypertension    Peripheral arterial disease ([place])    Substance abuse ([place])    Thyroid disease    Type 2 diabetes mellitus with other diabetic kidney complication ([place]) 3/71/6967    Past Surgical History:  Procedure Laterality Date   ABDOMINAL AORTOGRAM W/LOWER EXTREMITY N/A 02/08/2018   Procedure: ABDOMINAL AORTOGRAM W/LOWER EXTREMITY;  Surgeon: Conrad Haines, MD;  Location: [place] CV LAB;  Service: Cardiovascular;  Laterality: N/A;   ABDOMINAL AORTOGRAM W/LOWER EXTREMITY Bilateral 06/15/2020   Procedure: ABDOMINAL AORTOGRAM W/LOWER EXTREMITY;  Surgeon: Waynetta Sandy, MD;  Location: [place] [facility];  Service: Cardiovascular;  Laterality: Bilateral;   AV FISTULA PLACEMENT Left Aug. 2013 ?   BIOPSY  01/17/2020   Procedure: BIOPSY;  Surgeon: Jackquline Denmark, MD;  Location: [place];  Service: Endoscopy;;   COLONOSCOPY WITH PROPOFOL N/A 08/21/2018   Procedure: COLONOSCOPY WITH PROPOFOL;  Surgeon: Yetta Flock, MD;  Location: [place];  Service: Gastroenterology;  Laterality: N/A;   ENTEROSCOPY N/A 01/17/2020   Procedure: ENTEROSCOPY;  Surgeon: Jackquline Denmark, MD;  Location: [facility] ENDOSCOPY;  Service: Endoscopy;  Laterality: N/A;   ENTEROSCOPY N/A 07/31/2020   Procedure: ENTEROSCOPY;  Surgeon: Doran Stabler, MD;  Location: [place];  Service: Gastroenterology;  Laterality: N/A;   ENTEROSCOPY N/A 01/27/2021   Procedure: ENTEROSCOPY;  Surgeon: Yetta Flock, MD;  Location: [facility] ENDOSCOPY;  Service: Gastroenterology;  Laterality: N/A;   ESOPHAGOGASTRODUODENOSCOPY (EGD) WITH PROPOFOL N/A 08/20/2018   Procedure: ESOPHAGOGASTRODUODENOSCOPY (EGD)  WITH PROPOFOL;  Surgeon: Gatha Mayer, MD;  Location: [facility] ENDOSCOPY;  Service: Endoscopy;  Laterality: N/A;   FEMORAL-POPLITEAL BYPASS GRAFT Left 04/11/2018   Procedure: BYPASS GRAFT FEMORAL-POPLITEAL ARTERY LEFT LEG;  Surgeon: Rosetta Posner, MD;  Location: [facility] OR;  Service: Vascular;  Laterality: Left;   GIVENS CAPSULE STUDY N/A 07/31/2020   Procedure: GIVENS CAPSULE STUDY;  Surgeon: Doran Stabler, MD;  Location: [place];  Service: Gastroenterology;  Laterality: N/A;   HOT HEMOSTASIS N/A  08/20/2018   Procedure: HOT HEMOSTASIS (ARGON PLASMA COAGULATION/BICAP);  Surgeon: Gatha Mayer, MD;  Location: [facility] ENDOSCOPY;  Service: Endoscopy;  Laterality: N/A;   HOT HEMOSTASIS N/A 01/17/2020   Procedure: HOT HEMOSTASIS (ARGON PLASMA COAGULATION/BICAP);  Surgeon: Jackquline Denmark, MD;  Location: [facility] ENDOSCOPY;  Service: Endoscopy;  Laterality: N/A;   HOT HEMOSTASIS N/A 01/27/2021   Procedure: HOT HEMOSTASIS (ARGON PLASMA COAGULATION/BICAP);  Surgeon: Yetta Flock, MD;  Location: [facility] ENDOSCOPY;  Service: Gastroenterology;  Laterality: N/A;   LEFT HEART CATH AND CORONARY ANGIOGRAPHY N/A 03/29/2018   Procedure: LEFT HEART CATH AND CORONARY ANGIOGRAPHY;  Surgeon: Nelva Bush, MD;  Location: [place] CV LAB;  Service: Cardiovascular;  Laterality: N/A;   LIVER BIOPSY     ORIF ULNAR FRACTURE Left 05/18/2017   Procedure: OPEN REDUCTION INTERNAL FIXATION (ORIF) ULNAR FRACTURE;  Surgeon: Iran Planas, MD;  Location: [place];  Service: Orthopedics;  Laterality: Left;   PERIPHERAL VASCULAR INTERVENTION Right 06/15/2020   Procedure: PERIPHERAL VASCULAR INTERVENTION;  Surgeon: Waynetta Sandy, MD;  Location: [place] [facility];  Service: Cardiovascular;  Laterality: Right;   POLYPECTOMY  08/21/2018   Procedure: POLYPECTOMY;  Surgeon: Yetta Flock, MD;  Location: [place];  Service: Gastroenterology;;   Earney Mallet N/A 12/11/2012   Procedure: Earney Mallet;  Surgeon: Serafina Mitchell, MD;  Location: [facility] CATH LAB;  Service: Cardiovascular;  Laterality: N/A;   WOUND EXPLORATION Left 06/15/2020   Procedure: GROIN  EXPLORATION;  Surgeon: Waynetta Sandy, MD;  Location: [place];  Service: Vascular;  Laterality: Left;   Social History:  reports that he has been smoking cigarettes. He has a 25.00 pack-year smoking history. He has never used smokeless tobacco. He reports current alcohol use of about 10.0 standard drinks of alcohol per week. He reports that he does not currently use drugs  after having used the following drugs: Cocaine.  No Known Allergies  Family History  Problem Relation Age of Onset   Diabetes Father    Hypertension Father    Heart disease Father     Prior to Admission medications   Medication Sig Start Date End Date Taking? Authorizing Provider  albuterol (VENTOLIN HFA) 108 (90 Base) MCG/ACT inhaler Inhale 3 puffs into the lungs daily as needed for shortness of breath or wheezing. 11/06/19  Yes [provider]  amiodarone (PACERONE) 200 MG tablet Take 200 mg by mouth daily. 06/17/21  Yes [provider]  amLODipine (NORVASC) 5 MG tablet Take 5 mg by mouth daily. 07/22/21  Yes [provider]  Adair Patter 200-25 MCG/ACT AEPB Inhale 2 puffs into the lungs daily. 09/17/21  Yes [provider]  acetaminophen (TYLENOL) 325 MG tablet Take 2 tablets (650 mg total) by mouth every 6 (six) hours as needed for mild pain (or Fever >/= 101). Patient not taking: Reported on 11/11/2021 09/17/21   Raiford Noble Latif, DO  calcitRIOL (ROCALTROL) 0.5 MCG capsule Take 1 capsule (0.5 mcg total) by mouth Every Tuesday,Thursday,and Saturday with dialysis. 12/02/21  Mariel Aloe, MD  carvedilol (COREG) 12.5 MG tablet Take 12.5 mg by mouth 2 (two) times daily. 07/06/21   [provider]  cinacalcet (SENSIPAR) 30 MG tablet Take 6 tablets (180 mg total) by mouth Every Tuesday,Thursday,and Saturday with dialysis. 12/12/20   Mercy Riding, MD  Darbepoetin Alfa (ARANESP) 200 MCG/0.4ML SOSY injection Inject 0.4 mLs (200 mcg total) into the vein every Tuesday with hemodialysis. 05/25/21   France Ravens, MD  folic acid (FOLVITE) 1 MG tablet Take 1 tablet (1 mg total) by mouth daily. Patient not taking: Reported on 11/11/2021 09/17/21   Raiford Noble Latif, DO  multivitamin (RENA-VIT) TABS tablet Take 1 tablet by mouth at bedtime. Patient not taking: Reported on 11/11/2021 09/17/21   Raiford Noble Latif, DO  nicotine (NICODERM CQ - DOSED IN MG/24 HOURS) 14  mg/24hr patch Place 1 patch (14 mg total) onto the skin daily. Patient not taking: Reported on 11/11/2021 09/17/21   Raiford Noble Latif, DO  Nutritional Supplements (FEEDING SUPPLEMENT, NEPRO CARB STEADY,) LIQD Take 237 mLs by mouth 3 (three) times daily between meals. 12/01/21   Mariel Aloe, MD  pantoprazole (PROTONIX) 40 MG tablet Take 1 tablet (40 mg total) by mouth 2 (two) times daily before a meal. Patient not taking: Reported on 11/11/2021 09/17/21   Raiford Noble Latif, DO  polyethylene glycol powder (GLYCOLAX/MIRALAX) 17 GM/SCOOP powder Take 17 g by mouth daily as needed for mild constipation. Patient not taking: Reported on 11/11/2021 09/17/21   Raiford Noble Latif, DO  rosuvastatin (CRESTOR) 10 MG tablet Take 1 tablet (10 mg total) by mouth daily. Patient not taking: Reported on 11/11/2021 12/11/20 12/18/21  Mercy Riding, MD  sertraline (ZOLOFT) 25 MG tablet Take 25 mg by mouth daily. 12/07/21   [provider]  sevelamer carbonate (RENVELA) 800 MG tablet Take 5 tablets (4,000 mg total) by mouth 3 (three) times daily with meals. Patient not taking: Reported on 11/11/2021 09/17/21   Raiford Noble Latif, DO  thiamine 100 MG tablet Take 1 tablet (100 mg total) by mouth daily. Patient not taking: Reported on 11/11/2021 09/17/21   Raiford Noble Latif, DO  umeclidinium bromide (INCRUSE ELLIPTA) 62.5 MCG/ACT AEPB Inhale 1 puff into the lungs daily. Patient not taking: Reported on 11/11/2021 09/17/21   Raiford Noble Latif, DO  apixaban (ELIQUIS) 5 MG TABS tablet Take 1 tablet (5 mg total) by mouth 2 (two) times daily. 06/22/20 08/03/20  Karoline Caldwell, PA-C    Physical Exam: Vitals:   12/20/21 1140 12/20/21 1323 12/20/21 1430 12/20/21 1605  BP: (!) 134/96 (!) 133/96 (!) 161/143   Pulse: 93 96 93 91  Resp: '18 16 18 '$ (!) 30  Temp: 97.7 F (36.5 C)     SpO2: 100% 100% 100% 100%   General:  Appears calm and comfortable and is in NAD Eyes:  R eye with apparent visual impairment and patient also  reports L eye blurred vision; normal lids ENT:  grossly normal hearing, lips & tongue, mmm Neck:  no LAD, masses or thyromegaly Cardiovascular:  RRR, no m/r/g. No LE edema.  Respiratory:   Mild bibasilar crackles.  Normal respiratory effort. Abdomen:  soft, NT, ND Skin:  no rash or induration seen on limited exam Musculoskeletal:  grossly normal tone BUE/BLE, good ROM, no bony abnormality Psychiatric:  grossly normal mood and affect, speech fluent and appropriate, AOx3 Neurologic:  CN 2-12 grossly intact, moves all extremities in coordinated fashion   Radiological Exams on Admission: Independently reviewed - see  discussion in A/P where applicable  DG Chest 2 View  Result Date: 12/20/2021 CLINICAL DATA:  Chest pain with shortness of breath since earlier today. Lethargy. History of diabetes and hemodialysis. EXAM: CHEST - 2 VIEW COMPARISON:  Radiographs 11/11/2021 and 10/14/2021.  CT 08/10/2018. FINDINGS: Stable moderate enlargement of the cardiac silhouette with aortic atherosclerosis. The lungs appear clear. There is no pleural effusion or pneumothorax. No acute osseous findings are evident. Telemetry leads overlie the chest. IMPRESSION: No evidence of acute cardiopulmonary process. Stable moderate cardiomegaly. Electronically Signed   By: Richardean Sale M.D.   On: 12/20/2021 12:32    EKG: Independently reviewed.  Accelerated junctional rhythm with rate 93; nonspecific ST changes with no evidence of acute ischemia; + peaked t waves   Labs on Admission: I have personally reviewed the available labs and imaging studies at the time of the admission.  Pertinent labs:    K+ >7.5 BUN 74/Creatinine 12.79/GFR 4 BNP 2438 HS troponin 30 WBC 3.9 Hgb 7.8 - stable   Assessment and Plan: Principal Problem:   Hypervolemia associated with renal insufficiency Active Problems:   Hyperkalemia   ESRD (end stage renal disease) on dialysis (HCC)   COPD (chronic obstructive pulmonary disease)  (HCC)   Hypertension   Paroxysmal atrial fibrillation (HCC)   Polysubstance abuse (HCC)   Non-compliance with renal dialysis   Type 2 diabetes mellitus with other diabetic kidney complication (HCC)    Volume overload in an ESRD on HD patient -Suspect this is less related to heart failure and more related to incomplete HD session leading to volume overload -Since he is dialysis-dependent, he was unable to clear the excess fluid -He acknowledges shortening his session(s?) to 3 hours but does not appear to recognize that this likely led to current SOB -He is likely to benefit from emergent HD today and possibly tomorrow and may be appropriate for d/c to home tomorrow after HD; he also needs counseling regarding the importance of completing his HD sessions -Certainly, he needs fluid restriction and very low salt intake on an ongoing basis along with HD compliance -Nephrology prn order set utilized -Continue calcitriol, Sensipar, restart Renavite  Hyperkalemia -As above, very likely associated with HD non-compliance -Was given albuterol, Lasix, bicarb, Lokelma and insulin/glucose in ER -Appears to have peaked T waves and was also given Calcium gluconate now  -He is expected to go to HD in about 30 minutes and hyperkalemia should resolve with HD -Will observe on telemetry rather than progressive due to impending HD  HLD -Continue Crestor  COPD -Continue Breo, albuterol  HTN -Continue amlodipine, carvedilol  DM -Last A1c was 4.6 -Not on medications -He does not require ongoing DM management  Polysubstance abuse -Reports no recent cocaine use; will check UDS -Acknowledges a "small" glass of vodka over the weekend - lives in rehab house -Continue Zoloft -Encourage cessation  Afib -Continue amiodarone for rate control -He is not on AC  Tobacco dependence -Encourage cessation.   -Patch ordered    Advance Care Planning:   Code Status: Full Code   Consults: Nephrology; TOC  team  DVT Prophylaxis: Heparin  Family Communication: None present; he declined to have me contact his sister at the time of admission  Severity of Illness: The appropriate patient status for this patient is OBSERVATION. Observation status is judged to be reasonable and necessary in order to provide the required intensity of service to ensure the patient's safety. The patient's presenting symptoms, physical exam findings, and initial radiographic and laboratory  data in the context of their medical condition is felt to place them at decreased risk for further clinical deterioration. Furthermore, it is anticipated that the patient will be medically stable for discharge from the hospital within 2 midnights of admission.   Author: Karmen Bongo, MD 12/20/2021 4:16 PM  For on call review [email or web address].

## 2021-12-20 NOTE — ED Provider Triage Note (Signed)
Emergency Medicine Provider Triage Evaluation Note  KEYTON BHAT , a 66 y.o. male  was evaluated in triage.  Pt complains of chest pain and shortness of breath that started yesterday.  Patient is a dialysis patients and had a normal run of dialysis on Saturday.  He goes Tuesday Thursday Saturday.  Shortness of breath is worse with exertion.  Review of Systems  Positive:  Negative: See above   Physical Exam  BP (!) 134/96   Pulse 93   Temp 97.7 F (36.5 C)   Resp 18   SpO2 100%  Gen:   Awake, no distress   Resp:  Normal effort  MSK:   Moves extremities without difficulty  Other:    Medical Decision Making  Medically screening exam initiated at 11:52 AM.  Appropriate orders placed.  Massai E Rote was informed that the remainder of the evaluation will be completed by another provider, this initial triage assessment does not replace that evaluation, and the importance of remaining in the ED until their evaluation is complete.     Myna Bright [place], [place] 12/20/21 1154

## 2021-12-20 NOTE — ED Triage Notes (Signed)
Pt with two days of DOE and chest pain with inspiration. 98% SpO2 room air but placed on 3L O2 for comfort by EMS. 324 ASA given by EMS. T, Th, S dialysis patient, had full tx on Saturday.

## 2021-12-20 NOTE — Consult Note (Signed)
Renal Service Consult Note [facility]  Raymond [name] 12/20/2021 [name], MD Requesting Physician: Dr. Lorin Mercy  Reason for Consult: ESRD pt w/ hyperkalemia HPI: The patient is a 66 y.o. year-old w/ hx of CAD, anemia, COPD, ESRD on HD, hep C, HTN, PAD, DM2 who presented to ED w/ DOE and chest pain w/ inspiration. 98% on RA. TTS HD pt, last HD was Sat. Has not missed HD. In ED K+ was > 7.5. We are asked to see for HD.   Pt seen in ED.  States the "injections they gave me (for high K+) helped my breathing". States he had OJ and tomatoes this weekend.  No SOB or cough. Lives in a group / rehab type home w/ other people there. No missed HD. His place is called [facility]    ROS - denies CP, no joint pain, no HA, no blurry vision, no rash, no diarrhea, no nausea/ vomiting, no dysuria, no difficulty voiding   Past Medical History  Past Medical History:  Diagnosis Date   Anemia    CAD (coronary artery disease)    a. 03/2018: cath showing 20 to 30% stenosis along the LAD, luminal irregularities along the RCA, and angiographically normal LCx   COPD (chronic obstructive pulmonary disease) (HCC)    ESRD (end stage renal disease) on dialysis ([place])    "MWF; [facility]" (07/22/17)   Hepatitis C    Still positive s/p liver biopsy at [facility]  and interferon therapy for 6 months. Most recent lab work was on 10/24/12   Hepatitis C    "took the tx; gone now" (12/05/2016)  Was treated   History of blood transfusion ~ 2012/2013   "related to my kidneys; blood was low"   Hypertension    Peripheral arterial disease ([place])    Substance abuse ([place])    Thyroid disease    Type 2 diabetes mellitus with other diabetic kidney complication ([place]) 3/81/8299   Past Surgical History  Past Surgical History:  Procedure Laterality Date   ABDOMINAL AORTOGRAM W/LOWER EXTREMITY N/A 02/08/2018   Procedure: ABDOMINAL AORTOGRAM W/LOWER EXTREMITY;  Surgeon: Conrad Laporte, MD;  Location: [place] CV [facility];  Service: Cardiovascular;  Laterality: N/A;   ABDOMINAL AORTOGRAM W/LOWER EXTREMITY Bilateral 06/15/2020   Procedure: ABDOMINAL AORTOGRAM W/LOWER EXTREMITY;  Surgeon: Waynetta Sandy, MD;  Location: [place] [facility];  Service: Cardiovascular;  Laterality: Bilateral;   AV FISTULA PLACEMENT Left Aug. 2013 ?   BIOPSY  01/17/2020   Procedure: BIOPSY;  Surgeon: Jackquline Denmark, MD;  Location: [place];  Service: Endoscopy;;   COLONOSCOPY WITH PROPOFOL N/A 08/21/2018   Procedure: COLONOSCOPY WITH PROPOFOL;  Surgeon: Yetta Flock, MD;  Location: [place];  Service: Gastroenterology;  Laterality: N/A;   ENTEROSCOPY N/A 01/17/2020   Procedure: ENTEROSCOPY;  Surgeon: Jackquline Denmark, MD;  Location: [facility] ENDOSCOPY;  Service: Endoscopy;  Laterality: N/A;   ENTEROSCOPY N/A 07/31/2020   Procedure: ENTEROSCOPY;  Surgeon: Doran Stabler, MD;  Location: [place];  Service: Gastroenterology;  Laterality: N/A;   ENTEROSCOPY N/A 01/27/2021   Procedure: ENTEROSCOPY;  Surgeon: Yetta Flock, MD;  Location: [facility] ENDOSCOPY;  Service: Gastroenterology;  Laterality: N/A;   ESOPHAGOGASTRODUODENOSCOPY (EGD) WITH PROPOFOL N/A 08/20/2018   Procedure: ESOPHAGOGASTRODUODENOSCOPY (EGD) WITH PROPOFOL;  Surgeon: Gatha Mayer, MD;  Location: [place];  Service: Endoscopy;  Laterality: N/A;   FEMORAL-POPLITEAL BYPASS GRAFT Left 04/11/2018   Procedure: BYPASS GRAFT FEMORAL-POPLITEAL ARTERY LEFT LEG;  Surgeon: Donnetta Hutching,  Arvilla Meres, MD;  Location: [place];  Service: Vascular;  Laterality: Left;   GIVENS CAPSULE STUDY N/A 07/31/2020   Procedure: GIVENS CAPSULE STUDY;  Surgeon: Doran Stabler, MD;  Location: [place];  Service: Gastroenterology;  Laterality: N/A;   HOT HEMOSTASIS N/A 08/20/2018   Procedure: HOT HEMOSTASIS (ARGON PLASMA COAGULATION/BICAP);  Surgeon: Gatha Mayer, MD;  Location: [facility] ENDOSCOPY;  Service: Endoscopy;  Laterality: N/A;   HOT HEMOSTASIS N/A 01/17/2020    Procedure: HOT HEMOSTASIS (ARGON PLASMA COAGULATION/BICAP);  Surgeon: Jackquline Denmark, MD;  Location: [facility] [place] [facility] ENDOSCOPY;  Service: Endoscopy;  Laterality: N/A;   HOT HEMOSTASIS N/A 01/27/2021   Procedure: HOT HEMOSTASIS (ARGON PLASMA COAGULATION/BICAP);  Surgeon: Yetta Flock, MD;  Location: [facility] ENDOSCOPY;  Service: Gastroenterology;  Laterality: N/A;   LEFT HEART CATH AND CORONARY ANGIOGRAPHY N/A 03/29/2018   Procedure: LEFT HEART CATH AND CORONARY ANGIOGRAPHY;  Surgeon: Nelva Bush, MD;  Location: [place] CV LAB;  Service: Cardiovascular;  Laterality: N/A;   LIVER BIOPSY     ORIF ULNAR FRACTURE Left 05/18/2017   Procedure: OPEN REDUCTION INTERNAL FIXATION (ORIF) ULNAR FRACTURE;  Surgeon: Iran Planas, MD;  Location: [place];  Service: Orthopedics;  Laterality: Left;   PERIPHERAL VASCULAR INTERVENTION Right 06/15/2020   Procedure: PERIPHERAL VASCULAR INTERVENTION;  Surgeon: Waynetta Sandy, MD;  Location: [place] [facility];  Service: Cardiovascular;  Laterality: Right;   POLYPECTOMY  08/21/2018   Procedure: POLYPECTOMY;  Surgeon: Yetta Flock, MD;  Location: [place];  Service: Gastroenterology;;   Earney Mallet N/A 12/11/2012   Procedure: Earney Mallet;  Surgeon: Serafina Mitchell, MD;  Location: [facility] CATH LAB;  Service: Cardiovascular;  Laterality: N/A;   WOUND EXPLORATION Left 06/15/2020   Procedure: GROIN  EXPLORATION;  Surgeon: Waynetta Sandy, MD;  Location: [facility] OR;  Service: Vascular;  Laterality: Left;   Family History  Family History  Problem Relation Age of Onset   Diabetes Father    Hypertension Father    Heart disease Father    Social History  reports that he has been smoking cigarettes. He has a 25.00 pack-year smoking history. He has never used smokeless tobacco. He reports current alcohol use of about 10.0 standard drinks of alcohol per week. He reports that he does not currently use drugs after having used the following drugs: Cocaine. Allergies No  Known Allergies Home medications Prior to Admission medications   Medication Sig Start Date End Date Taking? Authorizing Provider  amiodarone (PACERONE) 200 MG tablet Take 200 mg by mouth daily. 06/17/21  Yes [provider]  amLODipine (NORVASC) 5 MG tablet Take 5 mg by mouth daily. 07/22/21  Yes [provider]  Adair Patter 200-25 MCG/ACT AEPB Inhale 2 puffs into the lungs daily. 09/17/21  Yes [provider]  calcitRIOL (ROCALTROL) 0.5 MCG capsule Take 1 capsule (0.5 mcg total) by mouth Every Tuesday,Thursday,and Saturday with dialysis. 12/02/21  Yes Mariel Aloe, MD  carvedilol (COREG) 12.5 MG tablet Take 12.5 mg by mouth 2 (two) times daily. 07/06/21  Yes [provider]  cinacalcet (SENSIPAR) 30 MG tablet Take 6 tablets (180 mg total) by mouth Every Tuesday,Thursday,and Saturday with dialysis. 12/12/20  Yes Mercy Riding, MD  Darbepoetin Alfa (ARANESP) 200 MCG/0.4ML SOSY injection Inject 0.4 mLs (200 mcg total) into the vein every Tuesday with hemodialysis. 05/25/21  Yes France Ravens, MD  pantoprazole (PROTONIX) 40 MG tablet Take 1 tablet (40 mg total) by mouth 2 (two) times daily before a meal. Patient taking differently: Take  40 mg by mouth daily. 09/17/21  Yes Sheikh, Omair Latif, DO  sertraline (ZOLOFT) 25 MG tablet Take 25 mg by mouth daily. 12/07/21  Yes [provider]  sevelamer carbonate (RENVELA) 800 MG tablet Take 5 tablets (4,000 mg total) by mouth 3 (three) times daily with meals. Patient taking differently: Take 4,000 mg by mouth See admin instructions. Take 4000 mg by mouth twice a day with meals 09/17/21  Yes Sheikh, Omair Latif, DO  acetaminophen (TYLENOL) 325 MG tablet Take 2 tablets (650 mg total) by mouth every 6 (six) hours as needed for mild pain (or Fever >/= 101). Patient not taking: Reported on 11/11/2021 09/17/21   Raiford Noble Latif, DO  albuterol (VENTOLIN HFA) 108 [phone] Base) MCG/ACT inhaler Inhale 3 puffs into the lungs daily as  needed for shortness of breath or wheezing. Patient not taking: Reported on 12/20/2021 11/06/19   [provider]  folic acid (FOLVITE) 1 MG tablet Take 1 tablet (1 mg total) by mouth daily. Patient not taking: Reported on 11/11/2021 09/17/21   Raiford Noble Latif, DO  multivitamin (RENA-VIT) TABS tablet Take 1 tablet by mouth at bedtime. Patient not taking: Reported on 11/11/2021 09/17/21   Raiford Noble Latif, DO  nicotine (NICODERM CQ - DOSED IN MG/24 HOURS) 14 mg/24hr patch Place 1 patch (14 mg total) onto the skin daily. Patient not taking: Reported on 11/11/2021 09/17/21   Raiford Noble Latif, DO  Nutritional Supplements (FEEDING SUPPLEMENT, NEPRO CARB STEADY,) LIQD Take 237 mLs by mouth 3 (three) times daily between meals. Patient not taking: Reported on 12/20/2021 12/01/21   Mariel Aloe, MD  polyethylene glycol powder (GLYCOLAX/MIRALAX) 17 GM/SCOOP powder Take 17 g by mouth daily as needed for mild constipation. Patient not taking: Reported on 11/11/2021 09/17/21   Raiford Noble Latif, DO  rosuvastatin (CRESTOR) 10 MG tablet Take 1 tablet (10 mg total) by mouth daily. Patient not taking: Reported on 11/11/2021 12/11/20 12/18/21  Mercy Riding, MD  umeclidinium bromide (INCRUSE ELLIPTA) 62.5 MCG/ACT AEPB Inhale 1 puff into the lungs daily. Patient not taking: Reported on 11/11/2021 09/17/21   Raiford Noble Latif, DO  apixaban (ELIQUIS) 5 MG TABS tablet Take 1 tablet (5 mg total) by mouth 2 (two) times daily. 06/22/20 08/03/20  Karoline Caldwell, PA-C     Vitals:   12/20/21 1140 12/20/21 1323 12/20/21 1430  BP: (!) 134/96 (!) 133/96 (!) 161/143  Pulse: 93 96 93  Resp: '18 16 18  '$ Temp: 97.7 F (36.5 C)    SpO2: 100% 100% 100%   Exam Gen alert, no distress No rash, cyanosis or gangrene Sclera anicteric, throat clear  No jvd or bruits Chest L base rales, poor air movement throughout, no ^wob RRR no MRG Abd soft ntnd no mass or ascites +bs GU normal male MS no joint effusions or  deformity Ext no LE or UE edema, no wounds or ulcers Neuro is alert, Ox 3 , nf    LFA AVF+bruit      Home meds include - amiodarone, amlodipine, breo ellipta, calcitriol, carvedilol, cinacalcet, darepoetin, pantoprazole, sertraline, sevelamer 5 ac tid, acetaminophen, albuterol, folic acid, renavite, nicotine patch, nepro, polyethylene, rosuvastatin, umeclidinium bromide     OP HD: [facility] TTS 4h  400/1.5   2/2 bath  LFA AVF  Hep none   Assessment/ Plan: Severe hyperkalemia - would give all temporizing ED measures including lokelma. Will use low K+ bath w/ HD. Will get HD asap this afternoon.  ESRD - on HD TTS. Has  not missed. HD today off schedule. May need HD tomorrow depending on weights and K+ in am.  Volume - no vol excess, CXR negative. Get wts.  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

## 2021-12-20 NOTE — ED Provider Notes (Signed)
[facility] EMERGENCY DEPARTMENT Provider Note   CSN: [id number] Arrival date & time: 12/20/21  1136     History Chief Complaint  Patient presents with   Chest Pain   Shortness of Breath    Raymond [name] is a 66 y.o. male history of end-stage renal disease on dialysis Tuesday Thursday Saturday who presents to the emergency department with dyspnea primarily with exertion that started yesterday.  Patient was also having some chest tightness primarily with inspiration.  He states that shortness of breath is worse with any exertion.  He was brought in by EMS and was placed on 3 L of oxygen for comfort.  Aspirin was given prior to arrival.  He had a normal run of dialysis on Saturday.  He denies any recent illness including fever, chills, cough, congestion, abdominal pain, nausea, vomiting, diarrhea.   Chest Pain Associated symptoms: shortness of breath   Shortness of Breath Associated symptoms: chest pain        Home Medications Prior to Admission medications   Medication Sig Start Date End Date Taking? Authorizing Provider  amiodarone (PACERONE) 200 MG tablet Take 200 mg by mouth daily. 06/17/21  Yes [provider]  amLODipine (NORVASC) 5 MG tablet Take 5 mg by mouth daily. 07/22/21  Yes [provider]  Adair Patter 200-25 MCG/ACT AEPB Inhale 2 puffs into the lungs daily. 09/17/21  Yes [provider]  calcitRIOL (ROCALTROL) 0.5 MCG capsule Take 1 capsule (0.5 mcg total) by mouth Every Tuesday,Thursday,and Saturday with dialysis. 12/02/21  Yes Mariel Aloe, MD  carvedilol (COREG) 12.5 MG tablet Take 12.5 mg by mouth 2 (two) times daily. 07/06/21  Yes [provider]  cinacalcet (SENSIPAR) 30 MG tablet Take 6 tablets (180 mg total) by mouth Every Tuesday,Thursday,and Saturday with dialysis. 12/12/20  Yes Mercy Riding, MD  Darbepoetin Alfa (ARANESP) 200 MCG/0.4ML SOSY injection Inject 0.4 mLs (200 mcg total) into the vein every  Tuesday with hemodialysis. 05/25/21  Yes France Ravens, MD  pantoprazole (PROTONIX) 40 MG tablet Take 1 tablet (40 mg total) by mouth 2 (two) times daily before a meal. Patient taking differently: Take 40 mg by mouth daily. 09/17/21  Yes Sheikh, Omair Latif, DO  sertraline (ZOLOFT) 25 MG tablet Take 25 mg by mouth daily. 12/07/21  Yes [provider]  sevelamer carbonate (RENVELA) 800 MG tablet Take 5 tablets (4,000 mg total) by mouth 3 (three) times daily with meals. Patient taking differently: Take 4,000 mg by mouth See admin instructions. Take 4000 mg by mouth twice a day with meals 09/17/21  Yes Sheikh, Omair Latif, DO  acetaminophen (TYLENOL) 325 MG tablet Take 2 tablets (650 mg total) by mouth every 6 (six) hours as needed for mild pain (or Fever >/= 101). Patient not taking: Reported on 11/11/2021 09/17/21   Raiford Noble Latif, DO  albuterol (VENTOLIN HFA) 108 [phone] Base) MCG/ACT inhaler Inhale 3 puffs into the lungs daily as needed for shortness of breath or wheezing. Patient not taking: Reported on 12/20/2021 11/06/19   [provider]  folic acid (FOLVITE) 1 MG tablet Take 1 tablet (1 mg total) by mouth daily. Patient not taking: Reported on 11/11/2021 09/17/21   Raiford Noble Latif, DO  multivitamin (RENA-VIT) TABS tablet Take 1 tablet by mouth at bedtime. Patient not taking: Reported on 11/11/2021 09/17/21   Raiford Noble Latif, DO  nicotine (NICODERM CQ - DOSED IN MG/24 HOURS) 14 mg/24hr patch Place 1 patch (14 mg total) onto the skin  daily. Patient not taking: Reported on 11/11/2021 09/17/21   Raiford Noble Latif, DO  Nutritional Supplements (FEEDING SUPPLEMENT, NEPRO CARB STEADY,) LIQD Take 237 mLs by mouth 3 (three) times daily between meals. Patient not taking: Reported on 12/20/2021 12/01/21   Mariel Aloe, MD  polyethylene glycol powder (GLYCOLAX/MIRALAX) 17 GM/SCOOP powder Take 17 g by mouth daily as needed for mild constipation. Patient not taking: Reported on 11/11/2021 09/17/21    Raiford Noble Latif, DO  rosuvastatin (CRESTOR) 10 MG tablet Take 1 tablet (10 mg total) by mouth daily. Patient not taking: Reported on 11/11/2021 12/11/20 12/18/21  Mercy Riding, MD  umeclidinium bromide (INCRUSE ELLIPTA) 62.5 MCG/ACT AEPB Inhale 1 puff into the lungs daily. Patient not taking: Reported on 11/11/2021 09/17/21   Raiford Noble Latif, DO  apixaban (ELIQUIS) 5 MG TABS tablet Take 1 tablet (5 mg total) by mouth 2 (two) times daily. 06/22/20 08/03/20  Karoline Caldwell, PA-C      Allergies    Patient has no known allergies.    Review of Systems   Review of Systems  Respiratory:  Positive for shortness of breath.   Cardiovascular:  Positive for chest pain.  All other systems reviewed and are negative.   Physical Exam Updated Vital Signs BP (!) 161/143   Pulse 93   Temp 97.7 F (36.5 C)   Resp 18   SpO2 100%  Physical Exam Vitals and nursing note reviewed.  Constitutional:      General: He is not in acute distress.    Appearance: Normal appearance.  HENT:     Head: Normocephalic and atraumatic.  Eyes:     General:        Right eye: No discharge.        Left eye: No discharge.  Cardiovascular:     Comments: Regular rate and rhythm.  S1/S2 are distinct without any evidence of murmur, rubs, or gallops.  Radial pulses are 2+ bilaterally.  Dorsalis pedis pulses are 2+ bilaterally.  No evidence of pedal edema. Pulmonary:     Comments: Clear to auscultation bilaterally.  Normal effort.  No respiratory distress.  No evidence of wheezes, rales, or rhonchi heard throughout. Abdominal:     General: Abdomen is flat. Bowel sounds are normal. There is no distension.     Tenderness: There is no abdominal tenderness. There is no guarding or rebound.  Musculoskeletal:        General: Normal range of motion.     Cervical back: Neck supple.  Skin:    General: Skin is warm and dry.     Findings: No rash.  Neurological:     General: No focal deficit present.     Mental Status: He is  alert.  Psychiatric:        Mood and Affect: Mood normal.        Behavior: Behavior normal.     ED Results / Procedures / Treatments   Labs (all labs ordered are listed, but only abnormal results are displayed) Labs Reviewed  BASIC METABOLIC PANEL - Abnormal; Notable for the following components:      Result Value   Potassium >7.5 (*)    BUN 74 (*)    Creatinine, Ser 12.79 (*)    GFR, Estimated 4 (*)    All other components within normal limits  CBC - Abnormal; Notable for the following components:   WBC 3.9 (*)    RBC 2.48 (*)    Hemoglobin 7.8 (*)  HCT 26.1 (*)    MCV 105.2 (*)    MCHC 29.9 (*)    RDW 19.7 (*)    All other components within normal limits  BRAIN NATRIURETIC PEPTIDE - Abnormal; Notable for the following components:   B Natriuretic Peptide 2,438.0 (*)    All other components within normal limits  TROPONIN I (HIGH SENSITIVITY) - Abnormal; Notable for the following components:   Troponin I (High Sensitivity) 30 (*)    All other components within normal limits  TYPE AND SCREEN  TROPONIN I (HIGH SENSITIVITY)    EKG EKG Interpretation  Date/Time:  Monday December 20 2021 11:43:51 EDT Ventricular Rate:  93 PR Interval:    QRS Duration: 118 QT Interval:  398 QTC Calculation: 494 R Axis:   39 Text Interpretation: Accelerated Junctional rhythm Non-specific intra-ventricular conduction delay Minimal voltage criteria for LVH, may be normal variant ( Cornell product ) Nonspecific ST and T wave abnormality Prolonged QT Abnormal ECG When compared with ECG of 12-Nov-2021 00:26, PREVIOUS ECG IS PRESENT Confirmed by Fredia Sorrow [phone]) on 12/20/2021 2:35:21 PM  Radiology DG Chest 2 View  Result Date: 12/20/2021 CLINICAL DATA:  Chest pain with shortness of breath since earlier today. Lethargy. History of diabetes and hemodialysis. EXAM: CHEST - 2 VIEW COMPARISON:  Radiographs 11/11/2021 and 10/14/2021.  CT 08/10/2018. FINDINGS: Stable moderate enlargement of the  cardiac silhouette with aortic atherosclerosis. The lungs appear clear. There is no pleural effusion or pneumothorax. No acute osseous findings are evident. Telemetry leads overlie the chest. IMPRESSION: No evidence of acute cardiopulmonary process. Stable moderate cardiomegaly. Electronically Signed   By: Richardean Sale M.D.   On: 12/20/2021 12:32    Procedures .Critical Care  Performed by: Hendricks Limes, PA-C Authorized by: Hendricks Limes, PA-C   Critical care provider statement:    Critical care time (minutes):  45   Critical care time was exclusive of:  Separately billable procedures and treating other patients   Critical care was necessary to treat or prevent imminent or life-threatening deterioration of the following conditions:  Metabolic crisis   Critical care was time spent personally by me on the following activities:  Ordering and performing treatments and interventions, ordering and review of laboratory studies, ordering and review of radiographic studies, pulse oximetry, re-evaluation of patient's condition, examination of patient and discussions with consultants     Medications Ordered in ED Medications  furosemide (LASIX) injection 40 mg (has no administration in time range)  albuterol (PROVENTIL) (2.5 MG/3ML) 0.083% nebulizer solution 10 mg (has no administration in time range)  sodium bicarbonate injection 50 mEq (has no administration in time range)  sodium zirconium cyclosilicate (LOKELMA) packet 10 g (has no administration in time range)  Chlorhexidine Gluconate Cloth 2 % PADS 6 each (has no administration in time range)  calcium gluconate inj 10% (1 g) URGENT USE ONLY! (1 g Intravenous Given 12/20/21 1543)  insulin aspart (novoLOG) injection 5 Units (5 Units Intravenous Given 12/20/21 1543)    And  dextrose 50 % solution 50 mL (50 mLs Intravenous Given 12/20/21 1543)    ED Course/ Medical Decision Making/ A&P Clinical Course as of 12/20/21 1558  Mon Dec 20, 2021  1446 CBC(!) There is evidence of leukopenia and anemia which appears to be of chronic disease.  Patient's baseline seems to be around 7. [CF]  7035 Basic metabolic panel(!!) Significant hyperkalemia with no evidence of hemolysis.  Creatinine elevated in the setting of end-stage renal disease on dialysis. [CF]  1447 Troponin I (High Sensitivity)(!) Initial troponin is elevated.  In comparison to previous results seems to be chronic for the patient.  Troponin level today is better appearing. [CF]  8242 Brain natriuretic peptide(!) BNP 2400 which is significant for the patient.  He has been more elevated in the past primarily 2 months ago so this is improved however given his dyspnea patient is likely volume up. [CF]  1447 Type and screen [place] Patient is B positive.  [CF]  1508 I spoke with Dr. Jonnie Finner with nephrology who agrees to consult on the patient and he recommends adding on Lokelma, sodium bicarb, Lasix, and albuterol.  He will come by and evaluate the patient. [CF]  1554 I spoke with Dr. Lorin Mercy with [facility] hospitalist who agrees to admit the patient. [CF]    Clinical Course User Index [CF] Hendricks Limes, PA-C                           Medical Decision Making Raymond [name] is a 66 y.o. male patient who presents to the emergency department today for further evaluation of dyspnea primarily with exertion.  I initially saw this patient in triage and initiated the work-up and ordered the labs.  Once he got back into treatment room after approximately 2 to 3 hours I saw him again in the back.  I personally reviewed the labs which is highlighted in the ED course.  Given the new EKG findings primarily the peak T waves and his elevated potassium I will give the patient some calcium gluconate in addition to some insulin and dextrose to help with potassium distribution.  Otherwise, patient is overall well-appearing in no acute distress.  He answers all questions  appropriately and is nontoxic-appearing.   Amount and/or Complexity of Data Reviewed External Data Reviewed: notes.    Details: Patient was recently discharged from the hospital late last month after being admitted for acute systolic heart failure and uremia. Labs: ordered. Decision-making details documented in ED Course. Radiology: ordered and independent interpretation performed.    Details: I personally ordered and interpreted chest x-ray which shows no evidence of  Risk OTC drugs. Prescription drug management. Decision regarding hospitalization. Risk Details: Given the clinical scenario, I do believe that the patient will need to be admitted for further evaluation.  I have ordered the necessary treatments to redistribute his potassium.  I spoken with nephrology to get him on the dialysis schedule.  But given his profound dyspnea and elevated BNP I do feel the patient will likely need to come in for further evaluation.  I will work on getting him mated to the hospitalist service.   Final Clinical Impression(s) / ED Diagnoses Final diagnoses:  Dyspnea on exertion  Hyperkalemia  Elevated brain natriuretic peptide (BNP) level    Rx / DC Orders ED Discharge Orders     None         Hendricks Limes, [place] 12/20/21 1558    Fredia Sorrow, MD 01/08/22 1553

## 2021-12-21 DIAGNOSIS — R0609 Other forms of dyspnea: Secondary | ICD-10-CM

## 2021-12-21 DIAGNOSIS — Z91158 Patient's noncompliance with renal dialysis for other reason: Secondary | ICD-10-CM | POA: Diagnosis not present

## 2021-12-21 DIAGNOSIS — N186 End stage renal disease: Secondary | ICD-10-CM | POA: Diagnosis present

## 2021-12-21 DIAGNOSIS — I480 Paroxysmal atrial fibrillation: Secondary | ICD-10-CM

## 2021-12-21 DIAGNOSIS — Z992 Dependence on renal dialysis: Secondary | ICD-10-CM

## 2021-12-21 DIAGNOSIS — E1129 Type 2 diabetes mellitus with other diabetic kidney complication: Secondary | ICD-10-CM

## 2021-12-21 DIAGNOSIS — E875 Hyperkalemia: Secondary | ICD-10-CM | POA: Diagnosis present

## 2021-12-21 DIAGNOSIS — I2510 Atherosclerotic heart disease of native coronary artery without angina pectoris: Secondary | ICD-10-CM | POA: Diagnosis present

## 2021-12-21 DIAGNOSIS — E1122 Type 2 diabetes mellitus with diabetic chronic kidney disease: Secondary | ICD-10-CM | POA: Diagnosis present

## 2021-12-21 DIAGNOSIS — J449 Chronic obstructive pulmonary disease, unspecified: Secondary | ICD-10-CM

## 2021-12-21 DIAGNOSIS — D631 Anemia in chronic kidney disease: Secondary | ICD-10-CM | POA: Diagnosis present

## 2021-12-21 DIAGNOSIS — E785 Hyperlipidemia, unspecified: Secondary | ICD-10-CM | POA: Diagnosis present

## 2021-12-21 DIAGNOSIS — E1151 Type 2 diabetes mellitus with diabetic peripheral angiopathy without gangrene: Secondary | ICD-10-CM | POA: Diagnosis present

## 2021-12-21 DIAGNOSIS — E8770 Fluid overload, unspecified: Secondary | ICD-10-CM | POA: Diagnosis present

## 2021-12-21 DIAGNOSIS — Z833 Family history of diabetes mellitus: Secondary | ICD-10-CM | POA: Diagnosis not present

## 2021-12-21 DIAGNOSIS — F1910 Other psychoactive substance abuse, uncomplicated: Secondary | ICD-10-CM

## 2021-12-21 DIAGNOSIS — F17210 Nicotine dependence, cigarettes, uncomplicated: Secondary | ICD-10-CM | POA: Diagnosis present

## 2021-12-21 DIAGNOSIS — I10 Essential (primary) hypertension: Secondary | ICD-10-CM

## 2021-12-21 DIAGNOSIS — Z79899 Other long term (current) drug therapy: Secondary | ICD-10-CM | POA: Diagnosis not present

## 2021-12-21 DIAGNOSIS — I120 Hypertensive chronic kidney disease with stage 5 chronic kidney disease or end stage renal disease: Secondary | ICD-10-CM | POA: Diagnosis present

## 2021-12-21 DIAGNOSIS — M898X9 Other specified disorders of bone, unspecified site: Secondary | ICD-10-CM | POA: Diagnosis present

## 2021-12-21 DIAGNOSIS — Z8249 Family history of ischemic heart disease and other diseases of the circulatory system: Secondary | ICD-10-CM | POA: Diagnosis not present

## 2021-12-21 DIAGNOSIS — Z7901 Long term (current) use of anticoagulants: Secondary | ICD-10-CM | POA: Diagnosis not present

## 2021-12-21 LAB — IRON AND TIBC
Iron: 147 ug/dL (ref 45–182)
Saturation Ratios: 36 % (ref 17.9–39.5)
TIBC: 413 ug/dL (ref 250–450)
UIBC: 266 ug/dL

## 2021-12-21 LAB — HEPATITIS B SURFACE ANTIGEN: Hepatitis B Surface Ag: NONREACTIVE

## 2021-12-21 LAB — PHOSPHORUS: Phosphorus: 4.1 mg/dL (ref 2.5–4.6)

## 2021-12-21 LAB — BASIC METABOLIC PANEL
Anion gap: 13 (ref 5–15)
BUN: 26 mg/dL — ABNORMAL HIGH (ref 8–23)
CO2: 28 mmol/L (ref 22–32)
Calcium: 8.9 mg/dL (ref 8.9–10.3)
Chloride: 92 mmol/L — ABNORMAL LOW (ref 98–111)
Creatinine, Ser: 6.02 mg/dL — ABNORMAL HIGH (ref 0.61–1.24)
GFR, Estimated: 10 mL/min — ABNORMAL LOW (ref >60)
Glucose, Bld: 98 mg/dL (ref 70–99)
Potassium: 3.7 mmol/L (ref 3.5–5.1)
Sodium: 133 mmol/L — ABNORMAL LOW (ref 135–145)

## 2021-12-21 LAB — HEPATITIS B CORE ANTIBODY, TOTAL: Hep B Core Total Ab: NONREACTIVE

## 2021-12-21 LAB — HEPATITIS C ANTIBODY: HCV Ab: REACTIVE — AB

## 2021-12-21 LAB — FOLATE: Folate: 24.9 ng/mL (ref >5.9)

## 2021-12-21 LAB — VITAMIN B12: Vitamin B-12: 753 pg/mL (ref 180–914)

## 2021-12-21 LAB — CBC
HCT: 19.8 % — ABNORMAL LOW (ref 39.0–52.0)
Hemoglobin: 6 g/dL — CL (ref 13.0–17.0)
MCH: 30.9 pg (ref 26.0–34.0)
MCHC: 30.3 g/dL (ref 30.0–36.0)
MCV: 102.1 fL — ABNORMAL HIGH (ref 80.0–100.0)
Platelets: 142 10*3/uL — ABNORMAL LOW (ref 150–400)
RBC: 1.94 MIL/uL — ABNORMAL LOW (ref 4.22–5.81)
RDW: 19.6 % — ABNORMAL HIGH (ref 11.5–15.5)
WBC: 2.7 10*3/uL — ABNORMAL LOW (ref 4.0–10.5)
nRBC: 0 % (ref 0.0–0.2)

## 2021-12-21 LAB — FERRITIN: Ferritin: 30 ng/mL (ref 24–336)

## 2021-12-21 LAB — HEMOGLOBIN AND HEMATOCRIT, BLOOD
HCT: 22.9 % — ABNORMAL LOW (ref 39.0–52.0)
HCT: 26.7 % — ABNORMAL LOW (ref 39.0–52.0)
Hemoglobin: 7.1 g/dL — ABNORMAL LOW (ref 13.0–17.0)
Hemoglobin: 8.6 g/dL — ABNORMAL LOW (ref 13.0–17.0)

## 2021-12-21 LAB — POTASSIUM: Potassium: 4.4 mmol/L (ref 3.5–5.1)

## 2021-12-21 LAB — HEPATITIS B SURFACE ANTIBODY,QUALITATIVE: Hep B S Ab: REACTIVE — AB

## 2021-12-21 LAB — ALBUMIN: Albumin: 2.7 g/dL — ABNORMAL LOW (ref 3.5–5.0)

## 2021-12-21 LAB — PREPARE RBC (CROSSMATCH)

## 2021-12-21 MED ORDER — FLUTICASONE FUROATE-VILANTEROL 200-25 MCG/ACT IN AEPB
2.0000 | INHALATION_SPRAY | Freq: Every day | RESPIRATORY_TRACT | Status: DC
Start: 2021-12-21 — End: 2021-12-22
  Administered 2021-12-22: 2 via RESPIRATORY_TRACT
  Filled 2021-12-21: qty 28

## 2021-12-21 MED ORDER — DIPHENHYDRAMINE HCL 25 MG PO CAPS
25.0000 mg | ORAL_CAPSULE | Freq: Once | ORAL | Status: AC
  Administered 2021-12-21: 25 mg via ORAL

## 2021-12-21 MED ORDER — SODIUM CHLORIDE 0.9% IV SOLUTION: Freq: Once | INTRAVENOUS | Status: DC

## 2021-12-21 MED ORDER — SODIUM CHLORIDE 0.9% IV SOLUTION: Freq: Once | INTRAVENOUS | Status: AC

## 2021-12-21 MED ORDER — DARBEPOETIN ALFA 200 MCG/0.4ML IJ SOSY
200.0000 ug | PREFILLED_SYRINGE | INTRAMUSCULAR | Status: DC
  Administered 2021-12-21: 200 ug via INTRAVENOUS
  Filled 2021-12-21 (×2): qty 0.4

## 2021-12-21 MED ORDER — ACETAMINOPHEN 325 MG PO TABS
650.0000 mg | ORAL_TABLET | Freq: Once | ORAL | Status: AC
  Administered 2021-12-21: 650 mg via ORAL

## 2021-12-21 MED ORDER — DIPHENHYDRAMINE HCL 25 MG PO CAPS
ORAL_CAPSULE | ORAL | Status: AC
  Filled 2021-12-21: qty 1

## 2021-12-21 MED ORDER — CHLORHEXIDINE GLUCONATE CLOTH 2 % EX PADS
6.0000 | MEDICATED_PAD | Freq: Every day | CUTANEOUS | Status: DC
  Administered 2021-12-21 – 2021-12-22 (×2): 6 via TOPICAL

## 2021-12-21 NOTE — Progress Notes (Signed)
[facility] Progress Note   Subjective:   Had dialysis last night and tolerated well. K+ was 3.7 early this AM. He denies SOB, CP, palpitations, dizziness, abdominal pain and nausea. Reports he thinks his EDW is incorrect because his BP drops outpatient and he "goes blind," which has been causing him to shorten his HD treatments. Has been running 3-3.5hr q HD.   Objective Vitals:   12/21/21 0500 12/21/21 0504 12/21/21 0612 12/21/21 0835  BP:  (!) 106/48 111/80 128/84  Pulse:  60 83 (!) 58  Resp:  '18 18 17  '$ Temp:  98.9 F (37.2 C) 98.3 F (36.8 C) 97.8 F (36.6 C)  TempSrc:  Oral Oral Oral  SpO2:  100% 97% 100%  Weight: 72.7 kg     Height:       Physical Exam General: WDWN male, alert and in NAD Heart: RRR, no murmurs, rubs or gallops Lungs: CTA bilaterally without wheezing, rhonchi or rales Abdomen: Soft, non-tender, non-distended, +BS Extremities: No edema b/l lower extremities Dialysis Access:  LUE AVF + t/b  Additional Objective Labs: Basic Metabolic Panel: Recent Labs  Lab 12/20/21 1154 12/21/21 0057  NA 140 133*  K >7.5* 3.7  CL 98 92*  CO2 27 28  GLUCOSE 84 98  BUN 74* 26*  CREATININE 12.79* 6.02*  CALCIUM 10.0 8.9  PHOS  --  4.1   Liver Function Tests: Recent Labs  Lab 12/21/21 0057  ALBUMIN 2.7*   No results for input(s): "LIPASE", "AMYLASE" in the last 168 hours. CBC: Recent Labs  Lab 12/20/21 1154 12/21/21 0057 12/21/21 0833  WBC 3.9* 2.7*  --   HGB 7.8* 6.0* 7.1*  HCT 26.1* 19.8* 22.9*  MCV 105.2* 102.1*  --   PLT 182 142*  --    Blood Culture    Component Value Date/Time   SDES BLOOD RIGHT HAND 11/14/2021 1616   SDES BLOOD RIGHT WRIST 11/14/2021 1616   SPECREQUEST  11/14/2021 1616    BOTTLES DRAWN AEROBIC ONLY Blood Culture results may not be optimal due to an inadequate volume of blood received in culture bottles   SPECREQUEST  11/14/2021 1616    BOTTLES DRAWN AEROBIC AND ANAEROBIC Blood Culture results may not be  optimal due to an inadequate volume of blood received in culture bottles   CULT  11/14/2021 1616    NO GROWTH 5 DAYS Performed at [facility], [place] [address], [place], [place] [address]    CULT  11/14/2021 1616    NO GROWTH 5 DAYS Performed at [facility] [address] [place], [place] [address]    REPTSTATUS 11/19/2021 FINAL 11/14/2021 1616   REPTSTATUS 11/19/2021 FINAL 11/14/2021 1616    Cardiac Enzymes: No results for input(s): "CKTOTAL", "CKMB", "CKMBINDEX", "TROPONINI" in the last 168 hours. CBG: No results for input(s): "GLUCAP" in the last 168 hours. Iron Studies: No results for input(s): "IRON", "TIBC", "TRANSFERRIN", "FERRITIN" in the last 72 hours. '@lablastinr3'$ @ Studies/Results: DG Chest 2 View  Result Date: 12/20/2021 CLINICAL DATA:  Chest pain with shortness of breath since earlier today. Lethargy. History of diabetes and hemodialysis. EXAM: CHEST - 2 VIEW COMPARISON:  Radiographs 11/11/2021 and 10/14/2021.  CT 08/10/2018. FINDINGS: Stable moderate enlargement of the cardiac silhouette with aortic atherosclerosis. The lungs appear clear. There is no pleural effusion or pneumothorax. No acute osseous findings are evident. Telemetry leads overlie the chest. IMPRESSION: No evidence of acute cardiopulmonary process. Stable moderate cardiomegaly. Electronically Signed   By: Richardean Sale  M.D.   On: 12/20/2021 12:32   Medications:   amiodarone  200 mg Oral Daily   amLODipine  5 mg Oral Daily   calcitRIOL  0.5 mcg Oral Q T,Th,Sa-HD   carvedilol  12.5 mg Oral BID   Chlorhexidine Gluconate Cloth  6 each Topical Q0600   cinacalcet  180 mg Oral Q T,Th,Sa-HD   fluticasone furoate-vilanterol  2 puff Inhalation Daily   furosemide  40 mg Intravenous Once   heparin  5,000 Units Subcutaneous Q8H   multivitamin  1 tablet Oral QHS   nicotine  14 mg Transdermal Daily   pantoprazole  40 mg Oral Daily   rosuvastatin  10 mg Oral Daily   sertraline  25 mg Oral Daily    sevelamer carbonate  4,000 mg Oral BID WC   sodium chloride flush  3 mL Intravenous Q12H    Dialysis Orders:  [facility] TTS 4h  400/1.5   2/2 bath  LFA AVF  Hep none  Severe hyperkalemia - Given temporizing ED measures followed by HD yesterday. K+ was 3.7 around 1AM. Hyperkalemia likely due to dietary issues and shortened HD. Discussed today.  2. ESRD - on HD TTS. Had HD yesterday off schedule. Still not to outpatient EDW but may need to be raised a bit. Will plan for 3hr treatment this afternoon to get him back on TTS schedule. UF goal 1.5-2L as tolerated and will adjust EDW based on post HD weights.  Volume - See above, BP is currently controlled. No volume excess on exam. Anemia esrd - Hb 6.0 this AM, given1 unit PRBC and now 7.1. Last tsat 35%. No bleeding reported. ESA appears to be overdue- was last given during his previous admission. Will order aranesp with HD today.  MBD ckd - Ca borderline high, improved today. Phos at goal. Continue  calcitriol and binders.  Atrial fib - on amiodarone, RRR today.  7. HTN - takes norvasc, coreg, BP currently controlled     Anice Paganini, PA-C 12/21/2021, 9:28 AM  [facility] Pager: [phone]

## 2021-12-21 NOTE — Progress Notes (Signed)
PROGRESS NOTE    Raymond [name]  ZWC:[id number] DOB: February 16, 1956 DOA: 12/20/2021 PCP: [name], FNP    Chief Complaint  Patient presents with   Chest Pain   Shortness of Breath    Brief Narrative:  Patient 66 year old gentleman history of CAD, COPD, ESRD on HD Tuesday Thursday Saturdays, hep C, hypertension, diabetes, polysubstance abuse presented to the ED worsening shortness of breath.  Patient noted to have last HD on Saturday which was shortened and noted to desats with sats of 72% and placed on oxygen.  HD was shortened because patient stated he was going blind due to his cataracts and only day 3 hours.  Patient seen in the ED noted to have elevated BNP, dyspnea on exertion, mildly elevated troponin, K > 7.5.  Patient seen in consultation by nephrology had hemodialysis emergently on presentation as well as temporizing measures for his hyperkalemia.  Patient also noted to be anemic hemoglobin dropping as low as 6.0 and receiving transfusion of 2 units packed red blood cells 12/21/2021.   Assessment & Plan:   Principal Problem:   Hypervolemia associated with renal insufficiency Active Problems:   Hyperkalemia   ESRD (end stage renal disease) on dialysis (HCC)   COPD (chronic obstructive pulmonary disease) (HCC)   Hypertension   Paroxysmal atrial fibrillation (HCC)   Polysubstance abuse (HCC)   Non-compliance with renal dialysis   Type 2 diabetes mellitus with other diabetic kidney complication (HCC)   #1 dyspnea on exertion/volume overload and end-stage renal disease on HD patient -Likely secondary to incomplete HD sessions leading to volume overload. -Patient noted to be dialysis dependent unable to clear excess fluid. -Patient seen in consultation by nephrology underwent emergent hemodialysis on day of admission 12/20/2021 and also currently in hemodialysis today 12/21/2021. -Continue calcitriol, Sensipar, Rena-Vite, Renvela. -Per nephrology.  2.  Severe  hyperkalemia -Secondary to shorten hemodialysis session/noncompliance. -Potassium noted on presentation to be > 7.5, EKG changes also noted. -Patient received temporizing measures in the ED on presentation. -Nephrology consulted and patient underwent emergent HD on admission 12/20/2021. -Potassium currently at 3.7. -Per nephrology.  3.  Hyperlipidemia -Statin.  4.  COPD -Stable. -Continue bronchodilators.  5.  Hypertension -Continue amlodipine, Coreg.  6.  Paroxysmal atrial fibrillation -Continue amiodarone and Coreg for rate control. -Patient not on anticoagulation.  7.  Anemia -Likely anemia of chronic disease. -Patient with no overt bleeding. -Hemoglobin is 6.0 this morning. -Transfused 2 units packed red blood cells. -Patient aranesp with HD per nephrology. -Per nephrology.  8.  Diabetes mellitus type 2 -Last hemoglobin A1c 4.6. -Does not require ongoing diabetes management. -Continue current diet control.  9.  Tobacco dependence -Tobacco cessation. -Nicotine patch.  10.  Polysubstance abuse -Patient with no recent cocaine use. -Continue Zoloft. -Patient noted to live in a rehab house.   DVT prophylaxis: Heparin Code Status: Full Family Communication: Updated patient.  No family at bedside. Disposition: Home when clinically improved, hemoglobin stabilized, cleared by nephrology hopefully in the next 1 to 2 days.  Status is: Inpatient Remains inpatient appropriate because: Severity of illness   Consultants:  Nephrology: Dr.Schertz 12/20/2021  Procedures:  Transfusion 2 units packed red blood cells 12/21/2021 Chest x-ray 12/20/2021    Antimicrobials: None   Subjective: In hemodialysis.  Currently getting a blood transfusion in HD.  States shortness of breath has improved since admission.  Denies any overt bleeding.  No chest pain.  Overall feeling better than on admission.  Objective: Vitals:   12/21/21 1300 12/21/21  1400 12/21/21 1448 12/21/21  1647  BP: 103/64 116/82 127/74 (!) 138/91  Pulse: 60 94 80 (!) 101  Resp: '18 17  18  '$ Temp:   98.1 F (36.7 C) 98.4 F (36.9 C)  TempSrc:   Oral Oral  SpO2: 99% 100% 100% 95%  Weight:   71.5 kg   Height:        Intake/Output Summary (Last 24 hours) at 12/21/2021 1823 Last data filed at 12/21/2021 1448 Gross per 24 hour  Intake 834 ml  Output --  Net 834 ml   Filed Weights   12/21/21 0500 12/21/21 1050 12/21/21 1448  Weight: 72.7 kg 72.9 kg 71.5 kg    Examination:  General exam: Appears calm and comfortable. Respiratory system: Clear to auscultation.  No wheezes, no crackles, no rhonchi.  Fair air movement.  Respiratory effort normal. Cardiovascular system: S1 & S2 heard, RRR. No JVD, murmurs, rubs, gallops or clicks. No pedal edema. Gastrointestinal system: Abdomen is nondistended, soft and nontender. No organomegaly or masses felt. Normal bowel sounds heard. Central nervous system: Alert and oriented. No focal neurological deficits. Extremities: Symmetric 5 x 5 power. Skin: No rashes, lesions or ulcers Psychiatry: Judgement and insight appear normal. Mood & affect appropriate.     Data Reviewed: I have personally reviewed following labs and imaging studies  CBC: Recent Labs  Lab 12/20/21 1154 12/21/21 0057 12/21/21 0833 12/21/21 1602  WBC 3.9* 2.7*  --   --   HGB 7.8* 6.0* 7.1* 8.6*  HCT 26.1* 19.8* 22.9* 26.7*  MCV 105.2* 102.1*  --   --   PLT 182 142*  --   --     Basic Metabolic Panel: Recent Labs  Lab 12/20/21 1154 12/21/21 0057 12/21/21 0833  NA 140 133*  --   K >7.5* 3.7 4.4  CL 98 92*  --   CO2 27 28  --   GLUCOSE 84 98  --   BUN 74* 26*  --   CREATININE 12.79* 6.02*  --   CALCIUM 10.0 8.9  --   PHOS  --  4.1  --     GFR: Estimated Creatinine Clearance: 11.4 mL/min (A) (by C-G formula based on SCr of 6.02 mg/dL (H)).  Liver Function Tests: Recent Labs  Lab 12/21/21 0057  ALBUMIN 2.7*    CBG: No results for input(s): "GLUCAP" in  the last 168 hours.   No results found for this or any previous visit (from the past 240 hour(s)).       Radiology Studies: DG Chest 2 View  Result Date: 12/20/2021 CLINICAL DATA:  Chest pain with shortness of breath since earlier today. Lethargy. History of diabetes and hemodialysis. EXAM: CHEST - 2 VIEW COMPARISON:  Radiographs 11/11/2021 and 10/14/2021.  CT 08/10/2018. FINDINGS: Stable moderate enlargement of the cardiac silhouette with aortic atherosclerosis. The lungs appear clear. There is no pleural effusion or pneumothorax. No acute osseous findings are evident. Telemetry leads overlie the chest. IMPRESSION: No evidence of acute cardiopulmonary process. Stable moderate cardiomegaly. Electronically Signed   By: Richardean Sale M.D.   On: 12/20/2021 12:32        Scheduled Meds:  sodium chloride   Intravenous Once   amiodarone  200 mg Oral Daily   amLODipine  5 mg Oral Daily   calcitRIOL  0.5 mcg Oral Q T,Th,Sa-HD   carvedilol  12.5 mg Oral BID   Chlorhexidine Gluconate Cloth  6 each Topical Q0600   Chlorhexidine Gluconate Cloth  6 each Topical Q0600  cinacalcet  180 mg Oral Q T,Th,Sa-HD   darbepoetin (ARANESP) injection - DIALYSIS  200 mcg Intravenous Q Tue-HD   diphenhydrAMINE       fluticasone furoate-vilanterol  2 puff Inhalation Daily   furosemide  40 mg Intravenous Once   heparin  5,000 Units Subcutaneous Q8H   multivitamin  1 tablet Oral QHS   nicotine  14 mg Transdermal Daily   pantoprazole  40 mg Oral Daily   rosuvastatin  10 mg Oral Daily   sertraline  25 mg Oral Daily   sevelamer carbonate  4,000 mg Oral BID WC   sodium chloride flush  3 mL Intravenous Q12H   Continuous Infusions:   LOS: 0 days    Time spent: 40 minutes    Irine Seal, MD [facility]   To contact the attending provider between 7A-7P or the covering provider during after hours 7P-7A, please log into the web site [email or web address] and access using universal [place]  password for that web site. If you do not have the password, please call the hospital operator.  12/21/2021, 6:23 PM

## 2021-12-22 DIAGNOSIS — J449 Chronic obstructive pulmonary disease, unspecified: Secondary | ICD-10-CM | POA: Diagnosis not present

## 2021-12-22 DIAGNOSIS — R0609 Other forms of dyspnea: Secondary | ICD-10-CM | POA: Diagnosis not present

## 2021-12-22 DIAGNOSIS — N186 End stage renal disease: Secondary | ICD-10-CM | POA: Diagnosis not present

## 2021-12-22 DIAGNOSIS — E8770 Fluid overload, unspecified: Secondary | ICD-10-CM | POA: Diagnosis not present

## 2021-12-22 LAB — TYPE AND SCREEN
ABO/RH(D): B POS
Antibody Screen: NEGATIVE
Unit division: 0
Unit division: 0

## 2021-12-22 LAB — CBC
HCT: 25.5 % — ABNORMAL LOW (ref 39.0–52.0)
Hemoglobin: 8.2 g/dL — ABNORMAL LOW (ref 13.0–17.0)
MCH: 31.2 pg (ref 26.0–34.0)
MCHC: 32.2 g/dL (ref 30.0–36.0)
MCV: 97 fL (ref 80.0–100.0)
Platelets: 150 10*3/uL (ref 150–400)
RBC: 2.63 MIL/uL — ABNORMAL LOW (ref 4.22–5.81)
RDW: 20.3 % — ABNORMAL HIGH (ref 11.5–15.5)
WBC: 2.7 10*3/uL — ABNORMAL LOW (ref 4.0–10.5)
nRBC: 0 % (ref 0.0–0.2)

## 2021-12-22 LAB — BPAM RBC
Blood Product Expiration Date: 202307042359
Blood Product Expiration Date: 202307092359
ISSUE DATE / TIME: 202306130305
ISSUE DATE / TIME: 202306131156
Unit Type and Rh: 7300
Unit Type and Rh: 7300

## 2021-12-22 LAB — RENAL FUNCTION PANEL
Albumin: 2.9 g/dL — ABNORMAL LOW (ref 3.5–5.0)
Anion gap: 6 (ref 5–15)
BUN: 16 mg/dL (ref 8–23)
CO2: 33 mmol/L — ABNORMAL HIGH (ref 22–32)
Calcium: 8.6 mg/dL — ABNORMAL LOW (ref 8.9–10.3)
Chloride: 97 mmol/L — ABNORMAL LOW (ref 98–111)
Creatinine, Ser: 5.36 mg/dL — ABNORMAL HIGH (ref 0.61–1.24)
GFR, Estimated: 11 mL/min — ABNORMAL LOW (ref >60)
Glucose, Bld: 106 mg/dL — ABNORMAL HIGH (ref 70–99)
Phosphorus: 3.8 mg/dL (ref 2.5–4.6)
Potassium: 4.1 mmol/L (ref 3.5–5.1)
Sodium: 136 mmol/L (ref 135–145)

## 2021-12-22 LAB — HEPATITIS B SURFACE ANTIBODY, QUANTITATIVE: Hep B S AB Quant (Post): >1000 m[IU]/mL (ref >9.9)

## 2021-12-22 NOTE — Progress Notes (Signed)
Discharge instructions given at this time. Pt transported off unit for DC with all belongings on self. He remains stable and oriented at baseline.

## 2021-12-22 NOTE — TOC Initial Note (Signed)
Transition of Care [facility]) - Initial/Assessment Note    Patient Details  Name: Raymond [name] MRN: [id number] Date of Birth: 04-21-1956  [facility]) CM/SW Contact:    Coralee Pesa, [place] Phone Number: 12/22/2021, 11:33 AM  Clinical Narrative:                 CSW spoke with pt and confirmed he is from [facility] and that he would like to return. CSW spoke with T. Pearline Cables [phone]) who confirmed pt can return and does not need anything from the hospital. Facility does not offer transport, pt requested a bus pass to get back to [address] [place] will provide this. TOC will be available for any further needs.  Expected Discharge Plan: Group Home Barriers to Discharge: Continued Medical Work up   Patient Goals and CMS Choice Patient states their goals for this hospitalization and ongoing recovery are:: Pt would like to return to Group home CMS Medicare.gov Compare Post Acute Care list provided to:: Patient Choice offered to / list presented to : Patient  Expected Discharge Plan and Services Expected Discharge Plan: Group Home     Post Acute Care Choice: Resumption of Svcs/PTA Provider Living arrangements for the past 2 months: Group Home                                      Prior Living Arrangements/Services Living arrangements for the past 2 months: Group Home Lives with:: Facility Resident Patient language and need for interpreter reviewed:: Yes Do you feel safe going back to the place where you live?: Yes      Need for Family Participation in Patient Care: No (Comment) Care giver support system in place?: No (comment)   Criminal Activity/Legal Involvement Pertinent to Current Situation/Hospitalization: No - Comment as needed  Activities of Daily Living Home Assistive Devices/Equipment: Cane (specify quad or straight) ADL Screening (condition at time of admission) Patient's cognitive ability adequate to safely complete daily  activities?: Yes Is the patient deaf or have difficulty hearing?: No Does the patient have difficulty seeing, even when wearing glasses/contacts?: Yes Does the patient have difficulty concentrating, remembering, or making decisions?: No Patient able to express need for assistance with ADLs?: Yes Does the patient have difficulty dressing or bathing?: No Independently performs ADLs?: Yes (appropriate for developmental age) Does the patient have difficulty walking or climbing stairs?: No Weakness of Legs: Both Weakness of Arms/Hands: None  Permission Sought/Granted Permission sought to share information with : Family Supports Permission granted to share information with : No              Emotional Assessment Appearance:: Appears stated age Attitude/Demeanor/Rapport: Engaged Affect (typically observed): Appropriate Orientation: : Oriented to Self, Oriented to Place, Oriented to  Time, Oriented to Situation Alcohol / Substance Use: Not Applicable Psych Involvement: No (comment)  Admission diagnosis:  Hyperkalemia [E87.5] Dyspnea on exertion [R06.09] Elevated brain natriuretic peptide (BNP) level [R79.89] Hypervolemia associated with renal insufficiency [E87.70, N28.9] Patient Active Problem List   Diagnosis Date Noted   Hypervolemia associated with renal insufficiency 12/20/2021   Hypertension    Paroxysmal atrial fibrillation (HCC)    Substance abuse (HCC)    Thrombocytopenia ([place]) 09/15/2021   Hypertensive urgency 09/13/2021   Uncontrolled hypertension    Uremia    Severe episode of recurrent major depressive disorder, without psychotic features (  [place])    ESRD (end stage renal disease) on dialysis ([place]) 05/18/2021   Cardiac arrest ([place]) 04/03/2021   Anisocoria 04/03/2021   Acute on chronic diastolic CHF (congestive heart failure) ([place]) 04/03/2021   Hypothermia not due to cold exposure 04/03/2021   Junctional bradycardia 04/03/2021   Pancytopenia ([place]) 01/25/2021   AMS  (altered mental status) 01/19/2021   Chronic viral hepatitis C ([place]) 12/25/2020   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 ([place]) 08/05/2020   Acute upper GI bleed 68/34/1962   Acute metabolic encephalopathy 22/97/9892   COVID-19 virus infection 07/29/2020   AVM (arteriovenous malformation) of stomach, acquired with hemorrhage    AVM (arteriovenous malformation) of small bowel, acquired with hemorrhage    Current use of long term anticoagulation    Sensorineural hearing loss (SNHL) of both ears 03/30/2020   History of arteriovenous malformation (AVM) 01/14/2020   UGIB (upper gastrointestinal bleed) 01/14/2020   Acute blood loss anemia 01/14/2020   Elevated troponin 01/14/2020   Allergy, unspecified, initial encounter 04/29/2019   Gastroenteritis 12/24/2018   Substance use disorder 12/24/2018   Type 2 diabetes mellitus with other diabetic kidney complication ([place]) 11/94/1740   Noncompliance of patient with renal dialysis 09/12/2018   Benign neoplasm of colon    Melena    Anemia    H/O medication noncompliance    Polysubstance (excluding opioids) dependence ([place])    Atrial flutter with rapid ventricular response ([place]) 08/10/2018   Chest pain 08/10/2018   PAF (paroxysmal atrial fibrillation) ([place]) 08/01/2018   Acute respiratory failure with hypoxia ([place]) 07/13/2018   Non-compliance with renal dialysis 07/13/2018   PAD (peripheral artery disease) ([place]) 04/11/2018   Sepsis ([place]) 03/31/2018   Preop cardiovascular exam 03/29/2018   Idiopathic gout, unspecified site 12/13/2017   Other specified complication of vascular prosthetic devices, implants and grafts, initial encounter ([place]) 11/03/2017   Volume overload 08/07/2017   Dyspnea 06/20/2017   Acute bronchitis    COPD (chronic obstructive pulmonary disease) ([place])    ESRD on hemodialysis ([place])    Hypoxia 12/05/2016   Hyperkalemia 12/19/2015   Hypoglycemia 12/19/2015   Polysubstance abuse ([place]) 12/19/2015   Homeless  12/19/2015   Anemia associated with stage 5 chronic renal failure ([place]) 12/19/2015   BPH without urinary obstruction 10/29/2015   Erectile dysfunction 10/29/2015   Hematuria, gross 10/29/2015   Hypercalcemia 11/07/2014   Pruritus, unspecified 10/17/2014   Coagulation defect, unspecified ([place]) 09/17/2014   Thrombocytopenia, unspecified ([place]) 10/17/2013   Iron deficiency anemia, unspecified 08/28/2013   Atherosclerosis of native arteries of extremity with intermittent claudication ([place]) 12/04/2012   ESRD (end stage renal disease) ([place]) 12/04/2012   Hypertensive chronic kidney disease with stage 5 chronic kidney disease or end stage renal disease ([place]) 11/20/2012   Secondary hyperparathyroidism of renal origin ([place]) 11/20/2012   HEPATITIS C 09/07/2006   HYPERCHOLESTEROLEMIA 09/07/2006   HYPERTENSION, BENIGN SYSTEMIC 09/07/2006   PCP:  [name], FNP Pharmacy:   [facility] [address] [place] [address] Phone: [phone] Fax: [phone]     Social Determinants of Health (SDOH) Interventions    Readmission Risk Interventions    12/01/2021   10:21 AM 11/26/2021    5:07 PM 06/22/2020    3:40 PM  Readmission Risk Prevention Plan  Transportation Screening  Complete Complete  Medication Review ([place])  Complete Complete  PCP or Specialist appointment within 3-5 days of discharge Complete  Complete  HRI or Home Care Consult Complete  Complete  SW Recovery Care/Counseling Consult  Complete Complete  Palliative Care Screening  Not Applicable Not Applicable  Skilled Nursing Facility Not Applicable  Patient Refused

## 2021-12-22 NOTE — Discharge Summary (Signed)
Physician Discharge Summary  Raymond [name] BWL:[id number] DOB: 07-23-1955 DOA: 12/20/2021  PCP: [name], FNP  Admit date: 12/20/2021 Discharge date: 12/22/2021  Time spent: 50 minutes  Recommendations for Outpatient Follow-up:  Follow-up with [name], FNP in 2 weeks. Follow-up at the [facility] as scheduled for regular dialysis on 12/23/2021.   Discharge Diagnoses:  Principal Problem:   Hypervolemia associated with renal insufficiency Active Problems:   Hyperkalemia   ESRD (end stage renal disease) on dialysis (HCC)   COPD (chronic obstructive pulmonary disease) (HCC)   Hypertension   Paroxysmal atrial fibrillation (HCC)   Polysubstance abuse (HCC)   Non-compliance with renal dialysis   Type 2 diabetes mellitus with other diabetic kidney complication ([place])   Discharge Condition: Stable and improved.  Diet recommendation: Heart healthy  Filed Weights   12/21/21 0500 12/21/21 1050 12/21/21 1448  Weight: 72.7 kg 72.9 kg 71.5 kg    History of present illness:  HPI per Dr. Mindi Curling is a 66 y.o. male with medical history significant of CAD; COPD; ESRD on TTS HD; Hep C; HTN; DM; and polysubstance abuse presenting with SOB.   He is here with SOB starting last night.  It started about 6pm, wasn't doing anything.  He dozes off to sleep and he was SOB.  He couldn't get any sleep.  Last HD was Saturday and it was not normal - his O2 went to 72 and he was started on O2.  He had to get off early "because I was going blind because of my cataracts", did 3 hours.       ER Course:  DOE, likely CHF.  BNP 2400, troponin mildly chronically elevated.  K > 7.5, not hemolyzed.  Getting treatment, will get HD soon.  Usually TTS, normal HD Saturday.   Hospital Course:  #1 dyspnea on exertion/volume overload and end-stage renal disease on HD patient -Likely secondary to incomplete HD sessions leading to volume overload. -Patient noted to be dialysis  dependent unable to clear excess fluid. -Patient seen in consultation by nephrology underwent emergent hemodialysis on day of admission 12/20/2021 and also on 12/21/2021. -Patient maintained on calcitriol, Sensipar, Rena-Vite, Renvela. -Patient followed by nephrology during the hospitalization. -Patient be discharged in stable improved condition with outpatient follow-up with his dialysis center as scheduled on 12/23/2021.  2.  Severe hyperkalemia -Secondary to shorten hemodialysis session/noncompliance. -Potassium noted on presentation to be > 7.5, EKG changes also noted. -Patient received temporizing measures in the ED on presentation. -Nephrology consulted and patient underwent emergent HD on admission 12/20/2021 and subsequently on 12/21/2021. -Hyperkalemia had resolved by day of discharge and potassium was 4.1. -Outpatient follow-up with nephrology.  3.  Hyperlipidemia -Patient maintained on statin.  4.  COPD -Stable. -Patient maintained on bronchodilators during the hospitalization.   5.  Hypertension -Controlled on home regimen of amlodipine, Coreg.  6.  Paroxysmal atrial fibrillation -Patient maintained on home regimen amiodarone and Coreg for rate control. -Patient not on anticoagulation.  7.  Anemia -Likely anemia of chronic disease. -Patient with no overt bleeding. -Hemoglobin dropped as low as 6.0 during this hospitalization and patient received 2 units packed red blood cell transfusion as well as aranesp.  Hemoglobin stabilized at 8.2 by day of discharge.  -Outpatient follow-up with nephrology.   8.  Well-controlled diabetes mellitus type 2 -Last hemoglobin A1c 4.6. -Does not require ongoing diabetes management. -Continue current diet control.  9.  Tobacco dependence -Tobacco cessation. -Patient placed  on a nicotine patch.  10.  Polysubstance abuse -Patient with no recent cocaine use. -Patient maintained on home regimen Zoloft. -Patient noted to live in a rehab  house.    Procedures: Transfusion 2 units packed red blood cells 12/21/2021 Chest x-ray 12/20/2021  Consultations: Nephrology: Dr.Schertz 12/20/2021  Discharge Exam: Vitals:   12/22/21 0756 12/22/21 0904  BP: (!) 152/90   Pulse: 68   Resp: 17   Temp: 98.2 F (36.8 C)   SpO2: 99% 97%    General: NAD Cardiovascular: RRR no murmurs rubs or gallops.  No JVD.  No lower extremity edema. Respiratory: Lungs clear to auscultation bilaterally.  No wheezes, no crackles, no rhonchi.  Discharge Instructions   Discharge Instructions     Diet - low sodium heart healthy   Complete by: As directed    Increase activity slowly   Complete by: As directed    Increase activity slowly   Complete by: As directed       Allergies as of 12/22/2021   No Known Allergies      Medication List     TAKE these medications    acetaminophen 325 MG tablet Commonly known as: TYLENOL Take 2 tablets (650 mg total) by mouth every 6 (six) hours as needed for mild pain (or Fever >/= 101).   albuterol 108 (90 Base) MCG/ACT inhaler Commonly known as: VENTOLIN HFA Inhale 3 puffs into the lungs daily as needed for shortness of breath or wheezing.   amiodarone 200 MG tablet Commonly known as: PACERONE Take 200 mg by mouth daily.   amLODipine 5 MG tablet Commonly known as: NORVASC Take 5 mg by mouth daily.   Breo Ellipta 200-25 MCG/ACT Aepb Generic drug: fluticasone furoate-vilanterol Inhale 2 puffs into the lungs daily.   calcitRIOL 0.5 MCG capsule Commonly known as: ROCALTROL Take 1 capsule (0.5 mcg total) by mouth Every Tuesday,Thursday,and Saturday with dialysis.   carvedilol 12.5 MG tablet Commonly known as: COREG Take 12.5 mg by mouth 2 (two) times daily.   cinacalcet 30 MG tablet Commonly known as: SENSIPAR Take 6 tablets (180 mg total) by mouth Every Tuesday,Thursday,and Saturday with dialysis.   Darbepoetin Alfa 200 MCG/0.4ML Sosy injection Commonly known as: ARANESP Inject 0.4  mLs (200 mcg total) into the vein every Tuesday with hemodialysis.   multivitamin Tabs tablet Take 1 tablet by mouth at bedtime.   nicotine 14 mg/24hr patch Commonly known as: NICODERM CQ - dosed in mg/24 hours Place 1 patch (14 mg total) onto the skin daily.   pantoprazole 40 MG tablet Commonly known as: PROTONIX Take 1 tablet (40 mg total) by mouth 2 (two) times daily before a meal. What changed: when to take this   rosuvastatin 10 MG tablet Commonly known as: CRESTOR Take 1 tablet (10 mg total) by mouth daily.   sertraline 25 MG tablet Commonly known as: ZOLOFT Take 25 mg by mouth daily.   sevelamer carbonate 800 MG tablet Commonly known as: RENVELA Take 5 tablets (4,000 mg total) by mouth 3 (three) times daily with meals. What changed:  when to take this additional instructions       No Known Allergies  Follow-up Information     [name], FNP. Schedule an appointment as soon as possible for a visit in 2 week(s).   Specialty: Family Medicine Contact information: [place] [address] [phone]         Josue Hector, MD .   Specialty: Cardiology Contact information: [phone]  [address] [place] [address] [facility] Follow up on 12/23/2021.   Why: Follow-up as scheduled for your regular hemodialysis on Thursday. Contact information: [address] [place] [address] [phone]                  The results of significant diagnostics from this hospitalization (including imaging, microbiology, ancillary and laboratory) are listed below for reference.    Significant Diagnostic Studies: DG Chest 2 View  Result Date: 12/20/2021 CLINICAL DATA:  Chest pain with shortness of breath since earlier today. Lethargy. History of diabetes and hemodialysis. EXAM: CHEST - 2 VIEW COMPARISON:  Radiographs 11/11/2021 and 10/14/2021.  CT 08/10/2018. FINDINGS: Stable moderate  enlargement of the cardiac silhouette with aortic atherosclerosis. The lungs appear clear. There is no pleural effusion or pneumothorax. No acute osseous findings are evident. Telemetry leads overlie the chest. IMPRESSION: No evidence of acute cardiopulmonary process. Stable moderate cardiomegaly. Electronically Signed   By: Richardean Sale M.D.   On: 12/20/2021 12:32   VAS [place] LOWER EXTREMITY VENOUS (DVT)  Result Date: 11/29/2021  Lower Venous DVT Study Patient Name:  JASANI DOLNEY  Date of Exam:   11/28/2021 Medical Rec #: [id number]      Accession #:    [id number] Date of Birth: 02-Mar-1956      Patient Gender: M Patient Age:   66 years Exam Location:  [facility] Procedure:      VAS [place] LOWER EXTREMITY VENOUS (DVT) Referring Phys: Terrilee Croak --------------------------------------------------------------------------------  Indications: Swelling. Other Indications: Patient did not go to dialysis for 2 weeks. Limitations: Acoustic shadowing from arterial plaque. Comparison Study: Prior negative bilateral LEV done 04/05/2021 Performing Technologist: Sharion Dove RVS  Examination Guidelines: A complete evaluation includes B-mode imaging, spectral Doppler, color Doppler, and power Doppler as needed of all accessible portions of each vessel. Bilateral testing is considered an integral part of a complete examination. Limited examinations for reoccurring indications may be performed as noted. The reflux portion of the exam is performed with the patient in reverse Trendelenburg.  +---------+---------------+---------+-----------+----------+--------------+ RIGHT    CompressibilityPhasicitySpontaneityPropertiesThrombus Aging +---------+---------------+---------+-----------+----------+--------------+ CFV      Full           Yes      Yes                                 +---------+---------------+---------+-----------+----------+--------------+ SFJ      Full                                                         +---------+---------------+---------+-----------+----------+--------------+ FV Prox  Full                                                        +---------+---------------+---------+-----------+----------+--------------+ FV Mid   Full                                                        +---------+---------------+---------+-----------+----------+--------------+  FV DistalFull                                                        +---------+---------------+---------+-----------+----------+--------------+ PFV      Full                                                        +---------+---------------+---------+-----------+----------+--------------+ POP      Full           Yes      Yes                                 +---------+---------------+---------+-----------+----------+--------------+ PTV      Full                                                        +---------+---------------+---------+-----------+----------+--------------+ PERO     Full                                                        +---------+---------------+---------+-----------+----------+--------------+   +---------+---------------+---------+-----------+----------+--------------+ LEFT     CompressibilityPhasicitySpontaneityPropertiesThrombus Aging +---------+---------------+---------+-----------+----------+--------------+ CFV      Full           Yes      Yes                                 +---------+---------------+---------+-----------+----------+--------------+ SFJ      Full                                                        +---------+---------------+---------+-----------+----------+--------------+ FV Prox  Full                                                        +---------+---------------+---------+-----------+----------+--------------+ FV Mid   Full                                                         +---------+---------------+---------+-----------+----------+--------------+ FV DistalFull                                                        +---------+---------------+---------+-----------+----------+--------------+  PFV      Full                                                        +---------+---------------+---------+-----------+----------+--------------+ POP      Full           Yes      Yes                                 +---------+---------------+---------+-----------+----------+--------------+ PTV      Full                                                        +---------+---------------+---------+-----------+----------+--------------+ PERO     Full                                                        +---------+---------------+---------+-----------+----------+--------------+     Summary: BILATERAL: - No evidence of deep vein thrombosis seen in the lower extremities, bilaterally. -No evidence of popliteal cyst, bilaterally.   *See table(s) above for measurements and observations. Electronically signed by Monica Martinez MD on 11/29/2021 at 5:10:49 PM.    Final     Microbiology: No results found for this or any previous visit (from the past 240 hour(s)).   Labs: Basic Metabolic Panel: Recent Labs  Lab 12/20/21 1154 12/21/21 0057 12/21/21 0833 12/22/21 0112  NA 140 133*  --  136  K >7.5* 3.7 4.4 4.1  CL 98 92*  --  97*  CO2 27 28  --  33*  GLUCOSE 84 98  --  106*  BUN 74* 26*  --  16  CREATININE 12.79* 6.02*  --  5.36*  CALCIUM 10.0 8.9  --  8.6*  PHOS  --  4.1  --  3.8   Liver Function Tests: Recent Labs  Lab 12/21/21 0057 12/22/21 0112  ALBUMIN 2.7* 2.9*   No results for input(s): "LIPASE", "AMYLASE" in the last 168 hours. No results for input(s): "AMMONIA" in the last 168 hours. CBC: Recent Labs  Lab 12/20/21 1154 12/21/21 0057 12/21/21 0833 12/21/21 1602 12/22/21 0112  WBC 3.9* 2.7*  --   --  2.7*  HGB 7.8* 6.0* 7.1* 8.6*  8.2*  HCT 26.1* 19.8* 22.9* 26.7* 25.5*  MCV 105.2* 102.1*  --   --  97.0  PLT 182 142*  --   --  150   Cardiac Enzymes: No results for input(s): "CKTOTAL", "CKMB", "CKMBINDEX", "TROPONINI" in the last 168 hours. BNP: BNP (last 3 results) Recent Labs    10/14/21 1804 12/20/21 1154  BNP 3,361.2* 2,438.0*    ProBNP (last 3 results) No results for input(s): "PROBNP" in the last 8760 hours.  CBG: No results for input(s): "GLUCAP" in the last 168 hours.     Signed:  Irine Seal MD.  [facility] 12/22/2021, 2:29 PM

## 2021-12-22 NOTE — Progress Notes (Signed)
[facility] Progress Note   Subjective:   Reports feeling well, asking if he can go home today. Denies SOB, CP, dizziness, abdominal pain and nausea. No issues with HD yesterday.   Objective Vitals:   12/21/21 1647 12/21/21 2002 12/22/21 0459 12/22/21 0756  BP: (!) 138/91 (!) 140/91 133/79 (!) 152/90  Pulse: (!) 101 97 (!) 109 68  Resp: '18 18 16 17  '$ Temp: 98.4 F (36.9 C) 98.5 F (36.9 C) 97.8 F (36.6 C) 98.2 F (36.8 C)  TempSrc: Oral Oral Oral Oral  SpO2: 95% 100% 97% 99%  Weight:      Height:       Physical Exam General: WDWN male, alert and in NAD Heart: RRR, no murmurs, rubs or gallops Lungs: CTA bilaterally without wheezing, rhonchi or rales Abdomen: Soft, non-tender, non-distended, +BS Extremities: No edema b/l lower extremities Dialysis Access:  LUE AVF + t/b  Additional Objective Labs: Basic Metabolic Panel: Recent Labs  Lab 12/20/21 1154 12/21/21 0057 12/21/21 0833 12/22/21 0112  NA 140 133*  --  136  K >7.5* 3.7 4.4 4.1  CL 98 92*  --  97*  CO2 27 28  --  33*  GLUCOSE 84 98  --  106*  BUN 74* 26*  --  16  CREATININE 12.79* 6.02*  --  5.36*  CALCIUM 10.0 8.9  --  8.6*  PHOS  --  4.1  --  3.8   Liver Function Tests: Recent Labs  Lab 12/21/21 0057 12/22/21 0112  ALBUMIN 2.7* 2.9*   No results for input(s): "LIPASE", "AMYLASE" in the last 168 hours. CBC: Recent Labs  Lab 12/20/21 1154 12/21/21 0057 12/21/21 0833 12/21/21 1602 12/22/21 0112  WBC 3.9* 2.7*  --   --  2.7*  HGB 7.8* 6.0* 7.1* 8.6* 8.2*  HCT 26.1* 19.8* 22.9* 26.7* 25.5*  MCV 105.2* 102.1*  --   --  97.0  PLT 182 142*  --   --  150   Blood Culture    Component Value Date/Time   SDES BLOOD RIGHT HAND 11/14/2021 1616   SDES BLOOD RIGHT WRIST 11/14/2021 1616   SPECREQUEST  11/14/2021 1616    BOTTLES DRAWN AEROBIC ONLY Blood Culture results may not be optimal due to an inadequate volume of blood received in culture bottles   SPECREQUEST  11/14/2021 1616     BOTTLES DRAWN AEROBIC AND ANAEROBIC Blood Culture results may not be optimal due to an inadequate volume of blood received in culture bottles   CULT  11/14/2021 1616    NO GROWTH 5 DAYS Performed at [facility], [place] [address], [place], [place] [address]    CULT  11/14/2021 1616    NO GROWTH 5 DAYS Performed at [facility] [address], [place], [place] [address]    REPTSTATUS 11/19/2021 FINAL 11/14/2021 1616   REPTSTATUS 11/19/2021 FINAL 11/14/2021 1616    Cardiac Enzymes: No results for input(s): "CKTOTAL", "CKMB", "CKMBINDEX", "TROPONINI" in the last 168 hours. CBG: No results for input(s): "GLUCAP" in the last 168 hours. Iron Studies:  Recent Labs    12/21/21 0833  IRON 147  TIBC 413  FERRITIN 30   '@lablastinr3'$ @ Studies/Results: DG Chest 2 View  Result Date: 12/20/2021 CLINICAL DATA:  Chest pain with shortness of breath since earlier today. Lethargy. History of diabetes and hemodialysis. EXAM: CHEST - 2 VIEW COMPARISON:  Radiographs 11/11/2021 and 10/14/2021.  CT 08/10/2018. FINDINGS: Stable moderate enlargement of the cardiac silhouette with aortic atherosclerosis.  The lungs appear clear. There is no pleural effusion or pneumothorax. No acute osseous findings are evident. Telemetry leads overlie the chest. IMPRESSION: No evidence of acute cardiopulmonary process. Stable moderate cardiomegaly. Electronically Signed   By: Richardean Sale M.D.   On: 12/20/2021 12:32   Medications:   sodium chloride   Intravenous Once   amiodarone  200 mg Oral Daily   amLODipine  5 mg Oral Daily   calcitRIOL  0.5 mcg Oral Q T,Th,Sa-HD   carvedilol  12.5 mg Oral BID   Chlorhexidine Gluconate Cloth  6 each Topical Q0600   Chlorhexidine Gluconate Cloth  6 each Topical Q0600   cinacalcet  180 mg Oral Q T,Th,Sa-HD   darbepoetin (ARANESP) injection - DIALYSIS  200 mcg Intravenous Q Tue-HD   fluticasone furoate-vilanterol  2 puff Inhalation Daily   furosemide  40 mg Intravenous  Once   heparin  5,000 Units Subcutaneous Q8H   multivitamin  1 tablet Oral QHS   nicotine  14 mg Transdermal Daily   pantoprazole  40 mg Oral Daily   rosuvastatin  10 mg Oral Daily   sertraline  25 mg Oral Daily   sevelamer carbonate  4,000 mg Oral BID WC   sodium chloride flush  3 mL Intravenous Q12H    Dialysis Orders:  [facility] TTS 4h  400/1.5   2/2 bath  LFA AVF  Hep none EDW 69kg  Assessment/Plan: Severe hyperkalemia - Given temporizing ED measures followed by HD yesterday. Hyperkalemia likely due to dietary issues and shortened HD. Resolved.  2. ESRD - on HD TTS. Reports he was shortening HD treatments because his BP was dropping and his vision would go dark. Will raise EDW slightly at discharge.  3. Volume - See above, BP is currently controlled. No volume excess on exam. 4. Anemia esrd - Hb 6.0 yesterday, s/p 2 unit PRBC. Last tsat 35%. No bleeding reported. ESA appears to be overdue- was last given during his previous admission. Given aranesp with HD yesterday.  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 Phos at goal. Continue  calcitriol and binders.  6. Atrial fib - on amiodarone, RRR today.  7. HTN - takes norvasc, coreg, BP elevated but improved overall.   Anice Paganini, PA-C 12/22/2021, 8:24 AM  [facility] Pager: [phone]

## 2021-12-22 NOTE — Progress Notes (Addendum)
Pt receives out-pt HD at [facility] on TTS. Pt arrives at 11:30 for 11:50 chair time. Will assist as needed.   Melven Sartorius Renal Navigator [phone]  Addendum at 2:01 pm: Communicated with attending and nephrologist. Plan is for pt to d/c this afternoon. Contacted [facility] to make clinic aware of pt's d/c this afternoon and pt will resume care tomorrow.

## 2021-12-27 NOTE — Progress Notes (Unsigned)
Cardiology Clinic Note   Patient Name: Raymond [name] Date of Encounter: 12/29/2021  Primary Care Provider:  [name], FNP Primary Cardiologist:  Jenkins Rouge, MD  Patient Profile    Raymond [name] 66 year old male presents the clinic today for follow-up evaluation of his essential hypertension and paroxysmal atrial fibrillation.  Past Medical History    Past Medical History:  Diagnosis Date   Anemia    CAD (coronary artery disease)    a. 03/2018: cath showing 22 to 30% stenosis along the LAD, luminal irregularities along the RCA, and angiographically normal LCx   COPD (chronic obstructive pulmonary disease) (HCC)    ESRD (end stage renal disease) on dialysis ([place])    "MWF; [facility] Rinks" (07/22/17)   Hepatitis C    Still positive s/p liver biopsy at [facility]  and interferon therapy for 6 months. Most recent lab work was on 10/24/12   Hepatitis C    "took the tx; gone now" (12/05/2016)  Was treated   History of blood transfusion ~ 2012/2013   "related to my kidneys; blood was low"   Hypertension    Peripheral arterial disease ([place])    Substance abuse ([place])    Thyroid disease    Type 2 diabetes mellitus with other diabetic kidney complication ([place]) [id number]/53/9767   Past Surgical History:  Procedure Laterality Date   ABDOMINAL AORTOGRAM W/LOWER EXTREMITY N/A 02/08/2018   Procedure: ABDOMINAL AORTOGRAM W/LOWER EXTREMITY;  Surgeon: Conrad Varina, MD;  Location: [place] [facility];  Service: Cardiovascular;  Laterality: N/A;   ABDOMINAL AORTOGRAM W/LOWER EXTREMITY Bilateral 06/15/2020   Procedure: ABDOMINAL AORTOGRAM W/LOWER EXTREMITY;  Surgeon: Waynetta Sandy, MD;  Location: [place] [facility];  Service: Cardiovascular;  Laterality: Bilateral;   AV FISTULA PLACEMENT Left Aug. 2013 ?   BIOPSY  01/17/2020   Procedure: BIOPSY;  Surgeon: Jackquline Denmark, MD;  Location: [place];  Service: Endoscopy;;   COLONOSCOPY WITH PROPOFOL N/A 08/21/2018   Procedure: COLONOSCOPY WITH  PROPOFOL;  Surgeon: Yetta Flock, MD;  Location: [place];  Service: Gastroenterology;  Laterality: N/A;   ENTEROSCOPY N/A 01/17/2020   Procedure: ENTEROSCOPY;  Surgeon: Jackquline Denmark, MD;  Location: [facility] ENDOSCOPY;  Service: Endoscopy;  Laterality: N/A;   ENTEROSCOPY N/A 07/31/2020   Procedure: ENTEROSCOPY;  Surgeon: Doran Stabler, MD;  Location: [place];  Service: Gastroenterology;  Laterality: N/A;   ENTEROSCOPY N/A 01/27/2021   Procedure: ENTEROSCOPY;  Surgeon: Yetta Flock, MD;  Location: [facility] ENDOSCOPY;  Service: Gastroenterology;  Laterality: N/A;   ESOPHAGOGASTRODUODENOSCOPY (EGD) WITH PROPOFOL N/A 08/20/2018   Procedure: ESOPHAGOGASTRODUODENOSCOPY (EGD) WITH PROPOFOL;  Surgeon: Gatha Mayer, MD;  Location: [place];  Service: Endoscopy;  Laterality: N/A;   FEMORAL-POPLITEAL BYPASS GRAFT Left 04/11/2018   Procedure: BYPASS GRAFT FEMORAL-POPLITEAL ARTERY LEFT LEG;  Surgeon: Rosetta Posner, MD;  Location[facility] OR;  Service: Vascular;  Laterality: Left;   GIVENS CAPSULE STUDY N/A 07/31/2020   Procedure: GIVENS CAPSULE STUDY;  Surgeon: Doran Stabler, MD;  Location: [place];  Service: Gastroenterology;  Laterality: N/A;   HOT HEMOSTASIS N/A 08/20/2018   Procedure: HOT HEMOSTASIS (ARGON PLASMA COAGULATION/BICAP);  Surgeon: Gatha Mayer, MD;  Location: [facility] ENDOSCOPY;  Service: Endoscopy;  Laterality: N/A;   HOT HEMOSTASIS N/A 01/17/2020   Procedure: HOT HEMOSTASIS (ARGON PLASMA COAGULATION/BICAP);  Surgeon: Jackquline Denmark, MD;  Location: [facility] ENDOSCOPY;  Service: Endoscopy;  Laterality: N/A;   HOT HEMOSTASIS N/A 01/27/2021   Procedure: HOT HEMOSTASIS (ARGON PLASMA  COAGULATION/BICAP);  Surgeon: Yetta Flock, MD;  Location: [facility] ENDOSCOPY;  Service: Gastroenterology;  Laterality: N/A;   LEFT HEART CATH AND CORONARY ANGIOGRAPHY N/A 03/29/2018   Procedure: LEFT HEART CATH AND CORONARY ANGIOGRAPHY;  Surgeon: Nelva Bush, MD;  Location: [place] [facility];   Service: Cardiovascular;  Laterality: N/A;   LIVER BIOPSY     ORIF ULNAR FRACTURE Left 05/18/2017   Procedure: OPEN REDUCTION INTERNAL FIXATION (ORIF) ULNAR FRACTURE;  Surgeon: Iran Planas, MD;  Location: [place];  Service: Orthopedics;  Laterality: Left;   PERIPHERAL VASCULAR INTERVENTION Right 06/15/2020   Procedure: PERIPHERAL VASCULAR INTERVENTION;  Surgeon: Waynetta Sandy, MD;  Location: [place] [facility];  Service: Cardiovascular;  Laterality: Right;   POLYPECTOMY  08/21/2018   Procedure: POLYPECTOMY;  Surgeon: Yetta Flock, MD;  Location: [place];  Service: Gastroenterology;;   [place] N/A 12/11/2012   Procedure: [place];  Surgeon: Serafina Mitchell, MD;  Location: [facility] CATH LAB;  Service: Cardiovascular;  Laterality: N/A;   WOUND EXPLORATION Left 06/15/2020   Procedure: GROIN  EXPLORATION;  Surgeon: Waynetta Sandy, MD;  Location: [place];  Service: Vascular;  Laterality: Left;    Allergies  No Known Allergies  History of Present Illness    Raymond [name] is a PMH of COPD, end-stage renal disease, HTN, substance abuse, thyroid disease, type 2 diabetes, mild CAD, and hypertensive heart disease.  He had a PE a arrest and was noted to be hypoglycemic during his 04/03/2021 admission.  Cardiology followed and it was noted that junctional rhythm.  His arrhythmia resolved.  His metoprolol 50 mg twice daily was continued.    He was admitted to the hospital 12/20/2021 and discharged on 12/22/2021.  He was noted to have hypovolemia associated with renal insufficiency.  He presented to the hospital with shortness of breath.  He indicated that his shortness of breath started the night before.  He was not active.  He dozed off to sleep however was unable to stay asleep due to his shortness of breath.  He underwent hemodialysis on 12/18/2021 which was normal.  His oxygen saturation decreased to 72 and he was started on oxygen.  He completed a 3-hour treatment.  In the  emergency department it was felt that his DOE was related to CHF.  His BNP was 2400.  His troponins were mildly chronically elevated and his potassium was 7.5.  He received HD.  It was felt that his dyspnea and fluid volume overload was secondary to incomplete hemodialysis session which led to fluid volume overload.  He was discharged in stable condition on 12/22/2021 and was instructed to receive HD as scheduled on 12/23/2028.  He presents to the clinic today for follow-up evaluation and states he feels fairly well today.  We reviewed his recent hospitalization and he expressed understanding.  His heart rate today is 112 bpm.  He does report occasional episodes of irregular heartbeat.  We reviewed his paroxysmal atrial fibrillation.  His blood pressure is well controlled today at 122/88.  We reviewed the importance of compliance with hemodialysis.  He reports that he is now receiving 3-1/2-hour treatments.  He has and eye exam scheduled for August.  He reports that during dialysis his vision is affected.  It appears he may have cataracts.  He has contacted the optometrist to see if he may obtain a sooner appointment.  I will increase his carvedilol to 25 mg in the morning and 12 and half milligrams in the afternoon.  We  will have him avoid triggers for palpitations, maintain his physical activity, and plan follow-up in 3 to 4 months.  Today he denies chest pain, shortness of breath, lower extremity edema, fatigue,  melena, hematuria, hemoptysis, diaphoresis, weakness, presyncope, syncope, orthopnea, and PND.   Home Medications    Prior to Admission medications   Medication Sig Start Date End Date Taking? Authorizing Provider  acetaminophen (TYLENOL) 325 MG tablet Take 2 tablets (650 mg total) by mouth every 6 (six) hours as needed for mild pain (or Fever >/= 101). Patient not taking: Reported on 11/11/2021 09/17/21   Raiford Noble Latif, DO  albuterol (VENTOLIN HFA) 108 [phone] Base) MCG/ACT inhaler Inhale 3  puffs into the lungs daily as needed for shortness of breath or wheezing. Patient not taking: Reported on 12/20/2021 11/06/19   [provider]  amiodarone (PACERONE) 200 MG tablet Take 200 mg by mouth daily. 06/17/21   [provider]  amLODipine (NORVASC) 5 MG tablet Take 5 mg by mouth daily. 07/22/21   [provider]  [place] Patter 200-25 MCG/ACT AEPB Inhale 2 puffs into the lungs daily. 09/17/21   [provider]  calcitRIOL (ROCALTROL) 0.5 MCG capsule Take 1 capsule (0.5 mcg total) by mouth Every Tuesday,Thursday,and Saturday with dialysis. 12/02/21   Mariel Aloe, MD  carvedilol (COREG) 12.5 MG tablet Take 12.5 mg by mouth 2 (two) times daily. 07/06/21   [provider]  cinacalcet (SENSIPAR) 30 MG tablet Take 6 tablets (180 mg total) by mouth Every Tuesday,Thursday,and Saturday with dialysis. 12/12/20   Mercy Riding, MD  Darbepoetin Alfa (ARANESP) 200 MCG/0.4ML SOSY injection Inject 0.4 mLs (200 mcg total) into the vein every Tuesday with hemodialysis. 05/25/21   France Ravens, MD  multivitamin (RENA-VIT) TABS tablet Take 1 tablet by mouth at bedtime. Patient not taking: Reported on 11/11/2021 09/17/21   Raiford Noble Latif, DO  nicotine (NICODERM CQ - DOSED IN MG/24 HOURS) 14 mg/24hr patch Place 1 patch (14 mg total) onto the skin daily. Patient not taking: Reported on 11/11/2021 09/17/21   Raiford Noble Latif, DO  pantoprazole (PROTONIX) 40 MG tablet Take 1 tablet (40 mg total) by mouth 2 (two) times daily before a meal. Patient taking differently: Take 40 mg by mouth daily. 09/17/21   Raiford Noble Latif, DO  rosuvastatin (CRESTOR) 10 MG tablet Take 1 tablet (10 mg total) by mouth daily. Patient not taking: Reported on 11/11/2021 12/11/20 12/18/21  Mercy Riding, MD  sertraline (ZOLOFT) 25 MG tablet Take 25 mg by mouth daily. 12/07/21   [provider]  sevelamer carbonate (RENVELA) 800 MG tablet Take 5 tablets (4,000 mg total) by mouth 3 (three) times  daily with meals. Patient taking differently: Take 4,000 mg by mouth See admin instructions. Take 4000 mg by mouth twice a day with meals 09/17/21   Raiford Noble Latif, DO  apixaban (ELIQUIS) 5 MG TABS tablet Take 1 tablet (5 mg total) by mouth 2 (two) times daily. 06/22/20 08/03/20  Karoline Caldwell, PA-C    Family History    Family History  Problem Relation Age of Onset   Diabetes Father    Hypertension Father    Heart disease Father    He indicated that his mother is deceased. He indicated that his father is deceased. He indicated that his sister is alive. He indicated that his brother is alive.  Social History    Social History   Socioeconomic History   Marital status: Single    Spouse name: Not  on file   Number of children: 2   Years of education: Not on file   Highest education level: 11th grade  Occupational History   Not on file  Tobacco Use   Smoking status: Every Day    Packs/day: 1.00    Years: 25.00    Total pack years: 25.00    Types: Cigarettes    Last attempt to quit: 08/01/2011    Years since quitting: 10.4   Smokeless tobacco: Never   Tobacco comments:    he smokes 3 cigarettes a day  Vaping Use   Vaping Use: Never used  Substance and Sexual Activity   Alcohol use: Yes    Alcohol/week: 10.0 standard drinks of alcohol    Types: 10 Glasses of wine per week    Comment: drank ETOH Saturday, 8 ounces vodka   Drug use: Not Currently    Types: Cocaine    Comment: not lately   Sexual activity: Not on file  Other Topics Concern   Not on file  Social History Narrative   Patient is living in a apartment alone. Professional cement finisher. Has 2 children (81yo & 13yo sons). Plans to make HCPOA for sister Neoma Laming to make decisions for him should he need them.    Social Determinants of Health   Financial Resource Strain: High Risk (08/18/2018)   Overall Financial Resource Strain (CARDIA)    Difficulty of Paying Living Expenses: Hard  Food Insecurity: Food  Insecurity Present (08/18/2018)   Hunger Vital Sign    Worried About Running Out of Food in the Last Year: Sometimes true    Ran Out of Food in the Last Year: Sometimes true  Transportation Needs: No Transportation Needs (08/18/2018)   PRAPARE - Hydrologist (Medical): No    Lack of Transportation (Non-Medical): No  Physical Activity: Not on file  Stress: Not on file  Social Connections: Unknown (08/18/2018)   Social Connection and Isolation Panel [NHANES]    Frequency of Communication with Friends and Family: Three times a week    Frequency of Social Gatherings with Friends and Family: Never    Attends Religious Services: Never    Marine scientist or Organizations: No    Attends Archivist Meetings: Never    Marital Status: Not on file  Intimate Partner Violence: Not At Risk (08/18/2018)   Humiliation, Afraid, Rape, and Kick questionnaire    Fear of Current or Ex-Partner: No    Emotionally Abused: No    Physically Abused: No    Sexually Abused: No     Review of Systems    General:  No chills, fever, night sweats or weight changes.  Cardiovascular:  No chest pain, dyspnea on exertion, edema, orthopnea, palpitations, paroxysmal nocturnal dyspnea. Dermatological: No rash, lesions/masses Respiratory: No cough, dyspnea Urologic: No hematuria, dysuria Abdominal:   No nausea, vomiting, diarrhea, bright red blood per rectum, melena, or hematemesis Neurologic:  No visual changes, wkns, changes in mental status. All other systems reviewed and are otherwise negative except as noted above.  Physical Exam    VS:  BP 122/88 (BP Location: Left Arm)   Pulse (!) 112   Ht '5\' 8"'$  (1.727 m)   Wt 162 lb 3.2 oz (73.6 kg)   SpO2 95%   BMI 24.66 kg/m  , BMI Body mass index is 24.66 kg/m. GEN: Well nourished, well developed, in no acute distress. HEENT: normal. Neck: Supple, no JVD, carotid bruits, or masses. Cardiac: RRR,  no murmurs, rubs, or gallops. No  clubbing, cyanosis, edema.  Radials/DP/PT 2+ and equal bilaterally.  Left leg greater than right Respiratory:  Respirations regular and unlabored, clear to auscultation bilaterally. GI: Soft, nontender, nondistended, BS + x 4. MS: no deformity or atrophy. Skin: warm and dry, no rash. Neuro:  Strength and sensation are intact. Psych: Normal affect.  Accessory Clinical Findings    Recent Labs: 04/03/2021: TSH 4.085 11/11/2021: ALT 7; Magnesium 2.2 12/20/2021: B Natriuretic Peptide 2,438.0 12/22/2021: BUN 16; Creatinine, Ser 5.36; Hemoglobin 8.2; Platelets 150; Potassium 4.1; Sodium 136   Recent Lipid Panel    Component Value Date/Time   CHOL 158 03/27/2018 1420   TRIG 649 (H) 04/04/2021 0500   HDL 74 03/27/2018 1420   CHOLHDL 2.1 03/27/2018 1420   CHOLHDL 2.0 Ratio 10/04/2007 2023   VLDL 31 10/04/2007 2023   LDLCALC 69 03/27/2018 1420    ECG personally reviewed by me today-none today.  Echocardiogram 04/04/2021  IMPRESSIONS     1. Left ventricular ejection fraction, by estimation, is 50 to 55%. The  left ventricle has low normal function. The left ventricle demonstrates  global hypokinesis. The left ventricular internal cavity size was mildly  dilated. There is severe left  ventricular hypertrophy. Left ventricular diastolic parameters are  indeterminate. The average left ventricular global longitudinal strain is  -19.1 %. The global longitudinal strain is normal.   2. Right ventricular systolic function is normal. The right ventricular  size is normal. There is moderately elevated pulmonary artery systolic  pressure.   3. Left atrial size was moderately dilated.   4. Right atrial size was moderately dilated.   5. The mitral valve is abnormal. Trivial mitral valve regurgitation. No  evidence of mitral stenosis.   6. Tricuspid valve regurgitation is moderate.   7. The aortic valve is tricuspid. There is moderate calcification of the  aortic valve. Aortic valve regurgitation  is not visualized. Mild to  moderate aortic valve sclerosis/calcification is present, without any  evidence of aortic stenosis.   8. Aortic dilatation noted. There is moderate dilatation of the ascending  aorta, measuring 45 mm.   9. The inferior vena cava is normal in size with greater than 50%  respiratory variability, suggesting right atrial pressure of 3 mmHg.  Cardiac catheterization 03/29/2018  Conclusions: Mild, non-obstructive coronary artery disease. Normal left ventricular systolic function with upper normal left ventricular filling pressure.   Recommendations: Medical therapy and risk factor modification to prevent progression of disease. Ok to proceed with vascular surgery from cardiac standpoint. Hemodialysis tomorrow, as scheduled.   Recommend Aspirin '81mg'$  daily for peripheral vascular disease.   Nelva Bush, MD [facility] Pager: [phone] Assessment & Plan   1.  Chronic diastolic CHF, DOE-euvolemic today, left leg greater than right.  No increased DOE or activity intolerance.  Reports compliance with HD.  Presented to the emergency department on 12/20/2021 and was discharged on 12/22/2021.  It was felt that he was fluid volume overloaded and dyspneic due to not completing full hemodialysis session.  He received hemodialysis and was discharged in stable condition on 12/22/2021. Continue carvedilol, HD Heart healthy low-sodium diet-salty 6 given Increase physical activity as tolerated Social work consult- for assistance with transportation  Coronary artery disease-no chest pain today.  Underwent cardiac catheterization 9/19 which showed mild nonobstructive CAD. Continue carvedilol, amlodipine, rosuvastatin Heart healthy low-sodium diet Increase physical activity as tolerated  Essential hypertension-BP today 122/88. Well-controlled at home. Continue amlodipine,  Increase carvedilol 25 mg a.m.  and 12 PM Heart healthy low-sodium diet  Paroxysmal atrial  fibrillation-heart rate today 112 Continue amiodarone, carvedilol, apixaban Heart healthy low-sodium diet-salty 6 given Increase physical activity as tolerated Increase carvedilol 25 mg a.m. and 12 PM  Hyperlipidemia-LDL 69 on 9/19 Continue rosuvastatin Heart healthy low-sodium high-fiber diet Increase physical activity as tolerated Repeat fasting lipids and LFTs  Hyperkalemia-potassium 4.1 on 12/22/2021. Potassium monitored with dialysis bath Continue hemodialysis  Polysubstance abuse-denies recent use of cocaine or other street drugs. Maintain abstinence. Congratulated on abstinence Increase physical activity as tolerated  Disposition: Follow-up with Dr. Johnsie Cancel or APP in 3-4 months.  Raymond Ng. Juanluis Guastella NP-C    12/29/2021, 11:42 AM [address] Office [phone] Fax [phone]  Notice: This dictation was prepared with Dragon dictation along with smaller phrase technology. Any transcriptional errors that result from this process are unintentional and may not be corrected upon review.  I spent 13 minutes examining this patient, reviewing medications, and using patient centered shared decision making involving her cardiac care.  Prior to her visit I spent greater than 20 minutes reviewing her past medical history,  medications, and prior cardiac tests.

## 2021-12-29 ENCOUNTER — Encounter: Payer: Self-pay | Admitting: General Practice

## 2021-12-29 ENCOUNTER — Telehealth: Payer: Self-pay | Admitting: Licensed Clinical Social Worker

## 2021-12-29 ENCOUNTER — Ambulatory Visit (INDEPENDENT_AMBULATORY_CARE_PROVIDER_SITE_OTHER): Payer: Medicare Other | Admitting: General Practice

## 2021-12-29 VITALS — BP 122/88 | HR 112 | Ht 68.0 in | Wt 162.2 lb

## 2021-12-29 DIAGNOSIS — E875 Hyperkalemia: Secondary | ICD-10-CM

## 2021-12-29 DIAGNOSIS — I2510 Atherosclerotic heart disease of native coronary artery without angina pectoris: Secondary | ICD-10-CM | POA: Diagnosis not present

## 2021-12-29 DIAGNOSIS — E782 Mixed hyperlipidemia: Secondary | ICD-10-CM

## 2021-12-29 DIAGNOSIS — I480 Paroxysmal atrial fibrillation: Secondary | ICD-10-CM

## 2021-12-29 DIAGNOSIS — F1910 Other psychoactive substance abuse, uncomplicated: Secondary | ICD-10-CM

## 2021-12-29 DIAGNOSIS — I5033 Acute on chronic diastolic (congestive) heart failure: Secondary | ICD-10-CM | POA: Diagnosis not present

## 2021-12-29 DIAGNOSIS — I10 Essential (primary) hypertension: Secondary | ICD-10-CM | POA: Diagnosis not present

## 2021-12-29 DIAGNOSIS — I739 Peripheral vascular disease, unspecified: Secondary | ICD-10-CM

## 2021-12-29 MED ORDER — CARVEDILOL 12.5 MG PO TABS
ORAL_TABLET | ORAL | 6 refills | Status: DC
Start: 2021-12-29 — End: 2022-08-29

## 2021-12-29 NOTE — Progress Notes (Signed)
Heart and Vascular Care Navigation  12/29/2021  Raymond [name] 18-Apr-1956 [id number]  Reason for Referral: Transportation Patient is participating in a Managed Medicaid Plan: No, UHC Medicare and Medicaid [place] Access Dual Complete  Engaged with patient by telephone for initial visit for Heart and Vascular Care Coordination.                                                                                                   Assessment:                                     LCSW was able to reach pt this afternoon at [phone]. Introduced self, role, reason for call. Pt confirmed home address- resides at [address] [facility] [employer] [address]. Confirmed emergency contacts, goes to [facility] for PCP. Pt shares that he had issue with transportation today and had to take bus b/c he didn't call within 3 days of his appt for Medicaid transportation. He otherwise takes [employer]/Medicaid transportation to dialysis and all appts. He denies any additional cost of living challenges, is able to access and afford housing/utilities, food, medications etc. No additional questions/concerns at this time. Pt aware he can reach out with any additional questions/concerns.   HRT/VAS Care Coordination     Patients Home Cardiology Office [facility]   Outpatient Care Team Social Worker   Social Worker Name: Valeda Malm, [place] [facility] [phone]   Living arrangements for the past 2 months Single Family Home  [facility] [employer] [place]   Lives with: Facility Resident   Patient Current Insurance Coverage Medicaid; Managed Medicare   Patient Has Concern With Paying Medical Bills No   Does Patient Have Prescription Coverage? Yes   Home Assistive Devices/Equipment Cane (specify quad or straight)   DME Agency [employer]   [place]       Social History:                                                                             SDOH Screenings   Alcohol  Screen: Not on file  Depression (WER1-5): Not on file (07/13/2017)  Financial Resource Strain: Low Risk  (12/29/2021)   Overall Financial Resource Strain (CARDIA)    Difficulty of Paying Living Expenses: Not very hard  Food Insecurity: No Food Insecurity (12/29/2021)   Hunger Vital Sign    Worried About Running Out of Food in the Last Year: Never true    Ran Out of Food in the Last Year: Never true  Housing: Low Risk  (12/29/2021)   Housing    Last Housing Risk Score: 0  Physical Activity: Not on file  Social Connections: Unknown (08/18/2018)   Social Connection and  Isolation Panel [NHANES]    Frequency of Communication with Friends and Family: Three times a week    Frequency of Social Gatherings with Friends and Family: Never    Attends Religious Services: Never    Marine scientist or Organizations: No    Attends Archivist Meetings: Never    Marital Status: Not on file  Stress: Not on file  Tobacco Use: High Risk (12/29/2021)   Patient History    Smoking Tobacco Use: Every Day    Smokeless Tobacco Use: Never    Passive Exposure: Not on file  Transportation Needs: No Transportation Needs (12/29/2021)   PRAPARE - Transportation    Lack of Transportation (Medical): No    Lack of Transportation (Non-Medical): No    SDOH Interventions: Financial Resources:  Pensions consultant Interventions: Intervention Not Indicated  Food Insecurity:  Food Insecurity Interventions: Intervention Not Indicated  Housing Insecurity:  Housing Interventions: Intervention Not Indicated  Transportation:   Transportation Interventions: Other (Comment) (given list of transportation options- utilizes Medicaid and [facility] Medicare transportation)    Other Care Navigation Interventions:     Inpatient/Outpatient Substance Abuse Counseling/Rehab Options Pt currently resides in Recovery Housing: [employer], [employer]   Follow-up plan:   Mailed pt the following: my card, list of transportation numbers (pt  aware of all). Also provided pt with update about food distribution event at [employer] this Saturday w/ [employer]. Pt aware I remain available but will not active follow- pt has SW assistance from his PCP and also his Dialysis center.

## 2021-12-29 NOTE — Patient Instructions (Signed)
Medication Instructions:  TAKE CARVEDILOL '25MG'$  IN THE AM AND 12.'5MG'$  IN THE PM  *If you need a refill on your cardiac medications before your next appointment, please call your pharmacy*  Lab Work:   Testing/Procedures:  NONE    NONE If you have labs (blood work) drawn today and your tests are completely normal, you will receive your results only by: [place] (if you have MyChart) OR  A paper copy in the mail If you have any lab test that is abnormal or we need to change your treatment, we will call you to review the results.  Special Instructions PLEASE READ AND FOLLOW SALTY 6 DIET SHEET ATTACHED.  REFERRAL FOR TRANSPORTATION-SOMEONE WILL BE CALLING YOU  Do not use any products that have nicotine or tobacco in them. These include cigarettes, e-cigarettes, and chewing tobacco. If you need help quitting, ask your doctor. Eat heart-healthy foods. Talk with your doctor about the right eating plan for you. Exercise regularly as told by your doctor. Stay hydrated Do not drink alcohol, Caffeine or chocolate. Lose weight if you are overweight. Do not use drugs, including cannabis   Follow-Up: Your next appointment:  3-4 month(s) In Person with Jenkins Rouge, MD   At [facility], you and your health needs are our priority.  As part of our continuing mission to provide you with exceptional heart care, we have created designated Provider Care Teams.  These Care Teams include your primary Cardiologist (physician) and Advanced Practice Providers (APPs -  Physician Assistants and Nurse Practitioners) who all work together to provide you with the care you need, when you need it.  We recommend signing up for the patient portal called "MyChart".  Sign up information is provided on this After Visit Summary.  MyChart is used to connect with patients for Virtual Visits (Telemedicine).  Patients are able to view lab/test results, encounter notes, upcoming appointments, etc.  Non-urgent messages can  be sent to your provider as well.   To learn more about what you can do with MyChart, go to [email or web address].     Important Information About Sugar

## 2021-12-29 NOTE — Addendum Note (Signed)
Addended by: Waylan Rocher on: 12/29/2021 01:09 PM   Modules accepted: Orders

## 2022-01-05 ENCOUNTER — Telehealth: Payer: Self-pay | Admitting: Licensed Clinical Social Worker

## 2022-01-05 NOTE — Telephone Encounter (Signed)
H&V Care Navigation CSW Progress Note  Clinical Social Worker  was contacted by phone by pt , he did not leave voicemail. This Probation officer returned his call at [phone]. Pt answered, inquires if I work for [facility] who makes home visits with him. Shared I do not, was able to locate a number for their services- provided pt with [phone]. He has written it down. No additional questions/concerns at this time.   Patient is participating in a Managed Medicaid Plan:  No, [place] Access Dual Complete  SDOH Screenings   Alcohol Screen: Not on file  Depression (LOV5-6): Not on file (07/13/2017)  Financial Resource Strain: Low Risk  (12/29/2021)   Overall Financial Resource Strain (CARDIA)    Difficulty of Paying Living Expenses: Not very hard  Food Insecurity: No Food Insecurity (12/29/2021)   Hunger Vital Sign    Worried About Running Out of Food in the Last Year: Never true    [place] in the Last Year: Never true  Housing: Low Risk  (12/29/2021)   Housing    Last Housing Risk Score: 0  Physical Activity: Not on file  Social Connections: Unknown (08/18/2018)   Social Connection and Isolation Panel [NHANES]    Frequency of Communication with Friends and Family: Three times a week    Frequency of Social Gatherings with Friends and Family: Never    Attends Religious Services: Never    Marine scientist or Organizations: No    Attends Archivist Meetings: Never    Marital Status: Not on file  Stress: Not on file  Tobacco Use: High Risk (12/29/2021)   Patient History    Smoking Tobacco Use: Every Day    Smokeless Tobacco Use: Never    Passive Exposure: Not on file  Transportation Needs: No Transportation Needs (12/29/2021)   PRAPARE - Transportation    Lack of Transportation (Medical): No    Lack of Transportation (Non-Medical): No   Westley Hummer, MSW, [place]  [phone]- work  cell phone (preferred) [phone]- desk phone

## 2022-01-10 ENCOUNTER — Encounter (HOSPITAL_COMMUNITY): Payer: Self-pay | Admitting: Emergency Medicine

## 2022-01-10 ENCOUNTER — Emergency Department (HOSPITAL_COMMUNITY)
Admission: EM | Admit: 2022-01-10 | Discharge: 2022-01-11 | Disposition: A | Discharge status: Home / Self Care | Payer: Medicare Other | Attending: Emergency Medicine | Admitting: Emergency Medicine

## 2022-01-10 ENCOUNTER — Emergency Department (HOSPITAL_COMMUNITY): Payer: Medicare Other

## 2022-01-10 DIAGNOSIS — Z992 Dependence on renal dialysis: Secondary | ICD-10-CM | POA: Insufficient documentation

## 2022-01-10 DIAGNOSIS — N186 End stage renal disease: Secondary | ICD-10-CM | POA: Diagnosis not present

## 2022-01-10 DIAGNOSIS — R799 Abnormal finding of blood chemistry, unspecified: Secondary | ICD-10-CM | POA: Insufficient documentation

## 2022-01-10 DIAGNOSIS — F10920 Alcohol use, unspecified with intoxication, uncomplicated: Secondary | ICD-10-CM | POA: Diagnosis present

## 2022-01-10 DIAGNOSIS — Y908 Blood alcohol level of 240 mg/100 ml or more: Secondary | ICD-10-CM | POA: Diagnosis not present

## 2022-01-10 DIAGNOSIS — Z7901 Long term (current) use of anticoagulants: Secondary | ICD-10-CM | POA: Insufficient documentation

## 2022-01-10 DIAGNOSIS — R4182 Altered mental status, unspecified: Secondary | ICD-10-CM | POA: Diagnosis not present

## 2022-01-10 LAB — COMPREHENSIVE METABOLIC PANEL
ALT: 7 U/L (ref 0–44)
AST: 10 U/L — ABNORMAL LOW (ref 15–41)
Albumin: 3.2 g/dL — ABNORMAL LOW (ref 3.5–5.0)
Alkaline Phosphatase: 59 U/L (ref 38–126)
Anion gap: 11 (ref 5–15)
BUN: 44 mg/dL — ABNORMAL HIGH (ref 8–23)
CO2: 29 mmol/L (ref 22–32)
Calcium: 8.9 mg/dL (ref 8.9–10.3)
Chloride: 106 mmol/L (ref 98–111)
Creatinine, Ser: 12.46 mg/dL — ABNORMAL HIGH (ref 0.61–1.24)
GFR, Estimated: 4 mL/min — ABNORMAL LOW (ref >60)
Glucose, Bld: 76 mg/dL (ref 70–99)
Potassium: 3.9 mmol/L (ref 3.5–5.1)
Sodium: 146 mmol/L — ABNORMAL HIGH (ref 135–145)
Total Bilirubin: 0.5 mg/dL (ref 0.3–1.2)
Total Protein: 6.6 g/dL (ref 6.5–8.1)

## 2022-01-10 LAB — CBC WITH DIFFERENTIAL/PLATELET
Abs Immature Granulocytes: 0 10*3/uL (ref 0.00–0.07)
Basophils Absolute: 0 10*3/uL (ref 0.0–0.1)
Basophils Relative: 0 %
Eosinophils Absolute: 0 10*3/uL (ref 0.0–0.5)
Eosinophils Relative: 1 %
HCT: 24.7 % — ABNORMAL LOW (ref 39.0–52.0)
Hemoglobin: 7.2 g/dL — ABNORMAL LOW (ref 13.0–17.0)
Immature Granulocytes: 0 %
Lymphocytes Relative: 41 %
Lymphs Abs: 1.1 10*3/uL (ref 0.7–4.0)
MCH: 31.2 pg (ref 26.0–34.0)
MCHC: 29.1 g/dL — ABNORMAL LOW (ref 30.0–36.0)
MCV: 106.9 fL — ABNORMAL HIGH (ref 80.0–100.0)
Monocytes Absolute: 0.6 10*3/uL (ref 0.1–1.0)
Monocytes Relative: 22 %
Neutro Abs: 1 10*3/uL — ABNORMAL LOW (ref 1.7–7.7)
Neutrophils Relative %: 36 %
Platelets: 118 10*3/uL — ABNORMAL LOW (ref 150–400)
RBC: 2.31 MIL/uL — ABNORMAL LOW (ref 4.22–5.81)
RDW: 20.2 % — ABNORMAL HIGH (ref 11.5–15.5)
WBC: 2.8 10*3/uL — ABNORMAL LOW (ref 4.0–10.5)
nRBC: 0 % (ref 0.0–0.2)

## 2022-01-10 LAB — ETHANOL: Alcohol, Ethyl (B): 278 mg/dL — ABNORMAL HIGH (ref <10)

## 2022-01-10 LAB — CBG MONITORING, ED: Glucose-Capillary: 79 mg/dL (ref 70–99)

## 2022-01-10 NOTE — ED Triage Notes (Signed)
Patient BIB EMS from friends house. Per report pt drink a whole bottle of liquor this afternoon.  Pt become hypotensive.  700 ml NS bolus given by EMS. Pt hx dialysis .

## 2022-01-10 NOTE — ED Provider Notes (Signed)
She has a [facility] DEPT Provider Note   CSN: [id number] Arrival date & time: 01/10/22  1828     History  Chief Complaint  Patient presents with   Alcohol Intoxication    Ilyas KEMUEL BUCHMANN is a 66 y.o. male.  66 year old male presents with acute alcohol intoxication patient has end-stage renal disease and is scheduled for dialysis tomorrow.  Patient is difficult historian but states that he drink copious amounts of alcohol today.  He was brought from a friend's house.  He reportedly drank a half a bottle of liquor.  EMS was called and he was hypotensive and given 700cc of fluids prior to arrival.  Patient states he was last dialyzed 2 days ago.       Home Medications Prior to Admission medications   Medication Sig Start Date End Date Taking? Authorizing Provider  albuterol (VENTOLIN HFA) 108 (90 Base) MCG/ACT inhaler Inhale 3 puffs into the lungs daily as needed for shortness of breath or wheezing. 11/06/19   [provider]  amiodarone (PACERONE) 200 MG tablet Take 200 mg by mouth daily. 06/17/21   [provider]  amLODipine (NORVASC) 5 MG tablet Take 5 mg by mouth daily. 07/22/21   [provider]  [place] Patter 200-25 MCG/ACT AEPB Inhale 2 puffs into the lungs daily. 09/17/21   [provider]  calcitRIOL (ROCALTROL) 0.5 MCG capsule Take 1 capsule (0.5 mcg total) by mouth Every Tuesday,Thursday,and Saturday with dialysis. 12/02/21   Mariel Aloe, MD  carvedilol (COREG) 12.5 MG tablet Take 2 tablets (25 mg total) by mouth every morning AND 1 tablet (12.5 mg total) every evening. 12/29/21   Deberah Pelton, NP  cinacalcet (SENSIPAR) 30 MG tablet Take 6 tablets (180 mg total) by mouth Every Tuesday,Thursday,and Saturday with dialysis. 12/12/20   Mercy Riding, MD  Darbepoetin Alfa (ARANESP) 200 MCG/0.4ML SOSY injection Inject 0.4 mLs (200 mcg total) into the vein every Tuesday with hemodialysis. 05/25/21   France Ravens, MD   multivitamin (RENA-VIT) TABS tablet Take 1 tablet by mouth at bedtime. 09/17/21   Sheikh, Omair Latif, DO  pantoprazole (PROTONIX) 40 MG tablet Take 1 tablet (40 mg total) by mouth 2 (two) times daily before a meal. Patient taking differently: Take 40 mg by mouth daily. 09/17/21   Raiford Noble Latif, DO  sertraline (ZOLOFT) 25 MG tablet Take 25 mg by mouth daily. 12/07/21   [provider]  sevelamer carbonate (RENVELA) 800 MG tablet Take 5 tablets (4,000 mg total) by mouth 3 (three) times daily with meals. Patient taking differently: Take 4,000 mg by mouth See admin instructions. Take 4000 mg by mouth twice a day with meals 09/17/21   Raiford Noble Latif, DO  apixaban (ELIQUIS) 5 MG TABS tablet Take 1 tablet (5 mg total) by mouth 2 (two) times daily. 06/22/20 08/03/20  Karoline Caldwell, PA-C      Allergies    Patient has no known allergies.    Review of Systems   Review of Systems  Unable to perform ROS: Mental status change    Physical Exam Updated Vital Signs BP 94/60   Pulse 81   Temp 98 F (36.7 C) (Oral)   Resp 18   SpO2 100%  Physical Exam Vitals and nursing note reviewed.  Constitutional:      General: He is not in acute distress.    Appearance: Normal appearance. He is well-developed. He is not toxic-appearing.  HENT:     Head: Normocephalic and  atraumatic.  Eyes:     General: Lids are normal.     Conjunctiva/sclera: Conjunctivae normal.     Pupils: Pupils are equal, round, and reactive to light.  Neck:     Thyroid: No thyroid mass.     Trachea: No tracheal deviation.  Cardiovascular:     Rate and Rhythm: Normal rate and regular rhythm.     Heart sounds: Normal heart sounds. No murmur heard.    No gallop.  Pulmonary:     Effort: Pulmonary effort is normal. No respiratory distress.     Breath sounds: Normal breath sounds. No stridor. No decreased breath sounds, wheezing, rhonchi or rales.  Abdominal:     General: There is no distension.     Palpations:  Abdomen is soft.     Tenderness: There is no abdominal tenderness. There is no rebound.  Musculoskeletal:        General: No tenderness. Normal range of motion.     Cervical back: Normal range of motion and neck supple.  Skin:    General: Skin is warm and dry.     Findings: No abrasion or rash.  Neurological:     General: No focal deficit present.     Mental Status: He is alert and oriented to person, place, and time. Mental status is at baseline.     GCS: GCS eye subscore is 4. GCS verbal subscore is 5. GCS motor subscore is 6.     Cranial Nerves: No cranial nerve deficit.     Sensory: No sensory deficit.     Motor: Motor function is intact.     Comments: Strength is 5 of 5 in upper as well as lower extremities  Psychiatric:        Attention and Perception: He is inattentive.        Speech: Speech is delayed and slurred.     ED Results / Procedures / Treatments   Labs (all labs ordered are listed, but only abnormal results are displayed) Labs Reviewed  CBC WITH DIFFERENTIAL/PLATELET  COMPREHENSIVE METABOLIC PANEL  ETHANOL    EKG EKG Interpretation  Date/Time:  Monday January 10 2022 19:26:51 EDT Ventricular Rate:  64 PR Interval:    QRS Duration: 119 QT Interval:  514 QTC Calculation: 531 R Axis:   35 Text Interpretation: Atrial flutter with predominant 3:1 AV block Nonspecific intraventricular conduction delay Inferior infarct, acute (LCx) Confirmed by Lacretia Leigh ([id number]) on 01/10/2022 8:23:16 PM  Radiology No results found.  Procedures Procedures    Medications Ordered in ED Medications - No data to display  ED Course/ Medical Decision Making/ A&P                           Medical Decision Making Amount and/or Complexity of Data Reviewed Labs: ordered. Radiology: ordered. ECG/medicine tests: ordered.   Patient's EKG per my interpretation shows atrial flutter with block.  Paroxysmal A-fib.  Extensive review of patient's record shows that he has had this  before in the past.  He is not a candidate for anticoagulation due to history of prior GI bleed.  Did discuss his EKG with the cardiologist on-call and he concurs with this.  Patient's rate is controlled and he will continue with his medications for that.  Patient with slurred speech likely from alcohol.  Patient has no evidence of hyperkalemia here.  Slight anemia noted which is around his baseline.  Head CT was performed and per my interpretation  shows no acute intracranial abnormality.  His alcohol level is significantly elevated at 278.  He will be allowed to sober up and then discharged home.  We will sign off next provider  CRITICAL CARE Performed by: Leota Jacobsen Total critical care time: 55 minutes Critical care time was exclusive of separately billable procedures and treating other patients. Critical care was necessary to treat or prevent imminent or life-threatening deterioration. Critical care was time spent personally by me on the following activities: development of treatment plan with patient and/or surrogate as well as nursing, discussions with consultants, evaluation of patient's response to treatment, examination of patient, obtaining history from patient or surrogate, ordering and performing treatments and interventions, ordering and review of laboratory studies, ordering and review of radiographic studies, pulse oximetry and re-evaluation of patient's condition.         Final Clinical Impression(s) / ED Diagnoses Final diagnoses:  None    Rx / DC Orders ED Discharge Orders     None         Lacretia Leigh, MD 01/10/22 2149

## 2022-01-11 NOTE — ED Notes (Signed)
Pt ambulated in hallway with RN, standby assistance only. Pt with altered gait however did not need assistance from staff. MD made aware that when getting up, pt stated he was "light headed and nauseous". Pt assisted back to bed and vitals obtained.

## 2022-01-11 NOTE — ED Notes (Signed)
I provided reinforced discharge education based off of discharge instructions. Pt acknowledged and understood my education. Pt had no further questions/concerns for provider/myself.  °

## 2022-01-11 NOTE — ED Notes (Signed)
Pt provided x1 bus pass.

## 2022-01-11 NOTE — ED Provider Notes (Signed)
Care of the patient assumed at the change of shift. History of ESRD on HD, here for alcohol intoxication pending sobering.  Physical Exam  BP 110/64   Pulse 87   Temp 98 F (36.7 C) (Oral)   Resp 16   SpO2 99%   Physical Exam  Procedures  Procedures  ED Course / MDM   Clinical Course as of 01/11/22 0603  Tue Jan 11, 2022  0210 Patient still unsteady on his feet. Will continue to monitor in the ED. He is due for dialysis in the AM.  [CS]  0601 Patient more awake now. Ready for discharge. Advised to go to dialysis today as scheduled.  [CS]    Clinical Course User Index [CS] Truddie Hidden, MD   Medical Decision Making Problems Addressed: Alcoholic intoxication without complication [facility]): acute illness or injury ESRD on hemodialysis [facility]): chronic illness or injury  Amount and/or Complexity of Data Reviewed Labs: ordered. Radiology: ordered. ECG/medicine tests: ordered.          Truddie Hidden, MD 01/11/22 [phone]

## 2022-03-09 ENCOUNTER — Ambulatory Visit (INDEPENDENT_AMBULATORY_CARE_PROVIDER_SITE_OTHER): Payer: Medicare Other | Admitting: Podiatry

## 2022-03-09 DIAGNOSIS — Z992 Dependence on renal dialysis: Secondary | ICD-10-CM

## 2022-03-09 DIAGNOSIS — N186 End stage renal disease: Secondary | ICD-10-CM

## 2022-03-09 DIAGNOSIS — M79676 Pain in unspecified toe(s): Secondary | ICD-10-CM

## 2022-03-09 DIAGNOSIS — D696 Thrombocytopenia, unspecified: Secondary | ICD-10-CM

## 2022-03-09 DIAGNOSIS — B351 Tinea unguium: Secondary | ICD-10-CM

## 2022-03-09 NOTE — Progress Notes (Signed)
This patient returns to my office for at risk foot care.  This patient requires this care by a professional since this patient will be at risk due to having thrombocytopenia and ESRD.This patient is unable to cut nails himself since the patient cannot reach his nails.These nails are painful walking and wearing shoes.  This patient presents for at risk foot care today.  General Appearance  Alert, conversant and in no acute stress.  Vascular  Dorsalis pedis and posterior tibial  pulses are  weakly palpable  bilaterally.  Capillary return is within normal limits  bilaterally. Temperature is within normal limits  bilaterally.  Neurologic  Senn-Weinstein monofilament wire test within normal limits  bilaterally. Muscle power within normal limits bilaterally.  Nails Thick disfigured discolored nails with subungual debris  from hallux to fifth toes bilaterally. No evidence of bacterial infection or drainage bilaterally.  Orthopedic  No limitations of motion  feet .  No crepitus or effusions noted.  No bony pathology or digital deformities noted.  Skin  normotropic skin with no porokeratosis noted bilaterally.  No signs of infections or ulcers noted.     Onychomycosis  Pain in right toes  Pain in left toes  Consent was obtained for treatment procedures.   Mechanical debridement of nails 1-5  bilaterally performed with a nail nipper.  Filed with dremel without incident.    Return office visit     4 months                 Told patient to return for periodic foot care and evaluation due to potential at risk complications.   Gardiner Barefoot DPM

## 2022-03-29 ENCOUNTER — Encounter: Payer: Self-pay | Admitting: Cardiovascular Disease

## 2022-04-11 ENCOUNTER — Ambulatory Visit: Payer: Medicaid Other | Admitting: Cardiovascular Disease

## 2022-04-18 NOTE — Progress Notes (Deleted)
Office Visit    Patient Name: Raymond [name] Date of Encounter: 04/18/2022  Primary Care Provider:  [name], FNP Primary Cardiologist:  Raymond Rouge, MD Primary Electrophysiologist: None  Chief Complaint    Raymond [name] is a 66 y.o. male with PMH of  PVD s/p right iliac stenting, ESRD (HD - MWF), Hepatitis C, HTN, HLD, COPD, chronic anemia and tobacco [place], thyroid disease, DM 2,PAF, PEA arrest (03/2021) who  presents today for follow-up of coronary disease.  Past Medical History    Past Medical History:  Diagnosis Date   Anemia    CAD (coronary artery disease)    a. 03/2018: cath showing 8 to 30% stenosis along the LAD, luminal irregularities along the RCA, and angiographically normal LCx   COPD (chronic obstructive pulmonary disease) (HCC)    ESRD (end stage renal disease) on dialysis ([place])    "MWF; [facility]" (07/22/17)   Hepatitis C    Still positive s/p liver biopsy at [facility]  and interferon therapy for 6 months. Most recent lab work was on 10/24/12   Hepatitis C    "took the tx; gone now" (12/05/2016)  Was treated   History of blood transfusion ~ 2012/2013   "related to my kidneys; blood was low"   Hypertension    Peripheral arterial disease ([place])    Substance abuse ([place])    Thyroid disease    Type 2 diabetes mellitus with other diabetic kidney complication ([place]) 3/79/0240   Past Surgical History:  Procedure Laterality Date   ABDOMINAL AORTOGRAM W/LOWER EXTREMITY N/A 02/08/2018   Procedure: ABDOMINAL AORTOGRAM W/LOWER EXTREMITY;  Surgeon: Raymond Copperopolis, MD;  Location: [place] [facility];  Service: Cardiovascular;  Laterality: N/A;   ABDOMINAL AORTOGRAM W/LOWER EXTREMITY Bilateral 06/15/2020   Procedure: ABDOMINAL AORTOGRAM W/LOWER EXTREMITY;  Surgeon: Raymond Sandy, MD;  Location: [place] [facility];  Service: Cardiovascular;  Laterality: Bilateral;   AV FISTULA PLACEMENT Left Aug. 2013 ?   BIOPSY  01/17/2020   Procedure: BIOPSY;  Surgeon: Raymond Denmark, MD;  Location: [place];  Service: Endoscopy;;   COLONOSCOPY WITH PROPOFOL N/A 08/21/2018   Procedure: COLONOSCOPY WITH PROPOFOL;  Surgeon: Raymond Flock, MD;  Location: [place];  Service: Gastroenterology;  Laterality: N/A;   ENTEROSCOPY N/A 01/17/2020   Procedure: ENTEROSCOPY;  Surgeon: Raymond Denmark, MD;  Location: [facility] ENDOSCOPY;  Service: Endoscopy;  Laterality: N/A;   ENTEROSCOPY N/A 07/31/2020   Procedure: ENTEROSCOPY;  Surgeon: Raymond Stabler, MD;  Location: [place];  Service: Gastroenterology;  Laterality: N/A;   ENTEROSCOPY N/A 01/27/2021   Procedure: ENTEROSCOPY;  Surgeon: Raymond Flock, MD;  Location: [facility] ENDOSCOPY;  Service: Gastroenterology;  Laterality: N/A;   ESOPHAGOGASTRODUODENOSCOPY (EGD) WITH PROPOFOL N/A 08/20/2018   Procedure: ESOPHAGOGASTRODUODENOSCOPY (EGD) WITH PROPOFOL;  Surgeon: Raymond Mayer, MD;  Location: [place];  Service: Endoscopy;  Laterality: N/A;   FEMORAL-POPLITEAL BYPASS GRAFT Left 04/11/2018   Procedure: BYPASS GRAFT FEMORAL-POPLITEAL ARTERY LEFT LEG;  Surgeon: Raymond Posner, MD;  Location: [facility] OR;  Service: Vascular;  Laterality: Left;   GIVENS CAPSULE STUDY N/A 07/31/2020   Procedure: GIVENS CAPSULE STUDY;  Surgeon: Raymond Stabler, MD;  Location: [place];  Service: Gastroenterology;  Laterality: N/A;   HOT HEMOSTASIS N/A 08/20/2018   Procedure: HOT HEMOSTASIS (ARGON PLASMA COAGULATION/BICAP);  Surgeon: Raymond Mayer, MD;  Location: [facility] ENDOSCOPY;  Service: Endoscopy;  Laterality: N/A;   HOT HEMOSTASIS N/A 01/17/2020   Procedure: HOT HEMOSTASIS (  ARGON PLASMA COAGULATION/BICAP);  Surgeon: Raymond Denmark, MD;  Location: [facility] ENDOSCOPY;  Service: Endoscopy;  Laterality: N/A;   HOT HEMOSTASIS N/A 01/27/2021   Procedure: HOT HEMOSTASIS (ARGON PLASMA COAGULATION/BICAP);  Surgeon: Raymond Flock, MD;  Location: [facility] ENDOSCOPY;  Service: Gastroenterology;  Laterality: N/A;   LEFT HEART CATH AND CORONARY ANGIOGRAPHY N/A  03/29/2018   Procedure: LEFT HEART CATH AND CORONARY ANGIOGRAPHY;  Surgeon: Raymond Bush, MD;  Location: [place] [facility];  Service: Cardiovascular;  Laterality: N/A;   LIVER BIOPSY     ORIF ULNAR FRACTURE Left 05/18/2017   Procedure: OPEN REDUCTION INTERNAL FIXATION (ORIF) ULNAR FRACTURE;  Surgeon: Iran Planas, MD;  Location: [place];  Service: Orthopedics;  Laterality: Left;   PERIPHERAL VASCULAR INTERVENTION Right 06/15/2020   Procedure: PERIPHERAL VASCULAR INTERVENTION;  Surgeon: Raymond Sandy, MD;  Location: [place] [facility];  Service: Cardiovascular;  Laterality: Right;   POLYPECTOMY  08/21/2018   Procedure: POLYPECTOMY;  Surgeon: Raymond Flock, MD;  Location: [place];  Service: Gastroenterology;;   Earney Mallet N/A 12/11/2012   Procedure: Earney Mallet;  Surgeon: Raymond Mitchell, MD;  Location: [facility] CATH LAB;  Service: Cardiovascular;  Laterality: N/A;   WOUND EXPLORATION Left 06/15/2020   Procedure: GROIN  EXPLORATION;  Surgeon: Raymond Sandy, MD;  Location: [place];  Service: Vascular;  Laterality: Left;    Allergies  No Known Allergies  History of Present Illness    Raymond [name]  is a 66 year old male with the above mention past medical history who presents today for follow-up of atrial fibrillation and coronary artery disease.  Raymond [name] has an extensive cardiac history dating back to 2015 when he was seen following complaint of chest pain.  EKG revealed diffuse J-point elevations with possible pericarditis.  2D echo was ordered however not performed by patient.  Patient was seen in 2018 and 2D echo performed with EF of 55 to 60% and grade 1 DD with ascending aortic aneurysm measuring 42 mm cardiac CT of the chest was also performed revealing multivessel CAD with right atrial enlargement.  In 2019 patient was evaluated for episodes of chest pain and dyspnea that occurred during dialysis.  Patient presented on 03/27/2018 for preop evaluation for PV procedure.   LHC was performed and revealed mild nonobstructive CAD with normal LV function.  On 04/15/2018 patient developed chest pain and was found to be in AF with RVR.  He was started on amiodarone 200 mg and warfarin with plan for DCCV.  He was admitted on 06/2020 following PV procedure for stenting of right common and internal iliac arteries.  He developed a groin or scrotal hematoma following procedure leading to surgical exploration.  He was transitioned to p.o. amiodarone and rates initially controlled and patient transition to Eliquis for DOAC.  He was admitted 04/03/2021 following PEA arrest with infield intubation.  Patient found to be hypoglycemic and significantly hypothermic upon arrival to ED.  EP was consulted and EKG showed no indication of ischemic heart disease.  Patient presented and junctional rhythm thought to be related to possible QT prolongation.  Patient's arrhythmia resolved and he was continued on metoprolol 50 mg twice daily.  He was most recently admitted 12/20/2021 for hypovolemia due to renal insufficiency.  BNP was 2400 and troponin mildly chronically elevated.  Patient admitted to incomplete HD session prior.  Patient was emergently dialyzed with stabilized and improved condition.  He was discharged on 6/15 and instructed to go to outpatient HD session as scheduled.  He was last seen by Coletta Memos, NP on 12/29/2021 for posthospital follow-up.  During visit patient's blood pressure was well controlled and he was rate controlled atrial fibrillation.  Patient is currently being followed for possible cataract extraction.  His carvedilol was increased to 25 mg twice daily.  Since last being seen in the office patient reports***.  Patient denies chest pain, palpitations, dyspnea, PND, orthopnea, nausea, vomiting, dizziness, syncope, edema, weight gain, or early satiety.  ***Notes:  Home Medications    Current Outpatient Medications  Medication Sig Dispense Refill   albuterol (VENTOLIN  HFA) 108 (90 Base) MCG/ACT inhaler Inhale 3 puffs into the lungs daily as needed for shortness of breath or wheezing.     amiodarone (PACERONE) 200 MG tablet Take 200 mg by mouth daily.     amLODipine (NORVASC) 5 MG tablet Take 5 mg by mouth daily.     BREO ELLIPTA 200-25 MCG/ACT AEPB Inhale 2 puffs into the lungs daily.     calcitRIOL (ROCALTROL) 0.5 MCG capsule Take 1 capsule (0.5 mcg total) by mouth Every Tuesday,Thursday,and Saturday with dialysis.     carvedilol (COREG) 12.5 MG tablet Take 2 tablets (25 mg total) by mouth every morning AND 1 tablet (12.5 mg total) every evening. 45 tablet 6   cinacalcet (SENSIPAR) 30 MG tablet Take 6 tablets (180 mg total) by mouth Every Tuesday,Thursday,and Saturday with dialysis. 60 tablet 1   Darbepoetin Alfa (ARANESP) 200 MCG/0.4ML SOSY injection Inject 0.4 mLs (200 mcg total) into the vein every Tuesday with hemodialysis. 1.68 mL    multivitamin (RENA-VIT) TABS tablet Take 1 tablet by mouth at bedtime.  0   pantoprazole (PROTONIX) 40 MG tablet Take 1 tablet (40 mg total) by mouth 2 (two) times daily before a meal. (Patient taking differently: Take 40 mg by mouth daily.)     sertraline (ZOLOFT) 25 MG tablet Take 25 mg by mouth daily.     sevelamer carbonate (RENVELA) 800 MG tablet Take 5 tablets (4,000 mg total) by mouth 3 (three) times daily with meals. (Patient taking differently: Take 4,000 mg by mouth See admin instructions. Take 4000 mg by mouth twice a day with meals)     No current facility-administered medications for this visit.   Facility-Administered Medications Ordered in Other Visits  Medication Dose Route Frequency Provider Last Rate Last Admin   midazolam (VERSED) 5 MG/5ML injection    Anesthesia Intra-op Sammie Bench, CRNA   2 mg at 04/11/18 1042     Review of Systems  Please see the history of present illness.    (+)*** (+)***  All other systems reviewed and are otherwise negative except as noted above.  Physical Exam     Wt Readings from Last 3 Encounters:  12/29/21 162 lb 3.2 oz (73.6 kg)  12/21/21 157 lb 10.1 oz (71.5 kg)  11/30/21 151 lb 14.4 oz (68.9 kg)   QM:GQQPY were no vitals filed for this visit.,There is no height or weight on file to calculate BMI.  Constitutional:      Appearance: Healthy appearance. Not in distress.  Neck:     Vascular: JVD normal.  Pulmonary:     Effort: Pulmonary effort is normal.     Breath sounds: No wheezing. No rales. Diminished in the bases Cardiovascular:     Normal rate. Regular rhythm. Normal S1. Normal S2.      Murmurs: There is no murmur.  Edema:    Peripheral edema absent.  Abdominal:     Palpations: Abdomen  is soft non tender. There is no hepatomegaly.  Skin:    General: Skin is warm and dry.  Neurological:     General: No focal deficit present.     Mental Status: Alert and oriented to person, place and time.     Cranial Nerves: Cranial nerves are intact.  EKG/LABS/Other Studies Reviewed    ECG personally reviewed by me today - ***  Risk Assessment/Calculations:   {Does this patient have ATRIAL FIBRILLATION?:[phone]}        Lab Results  Component Value Date   WBC 2.8 (L) 01/10/2022   HGB 7.2 (L) 01/10/2022   HCT 24.7 (L) 01/10/2022   MCV 106.9 (H) 01/10/2022   PLT 118 (L) 01/10/2022   Lab Results  Component Value Date   CREATININE 12.46 (H) 01/10/2022   BUN 44 (H) 01/10/2022   NA 146 (H) 01/10/2022   K 3.9 01/10/2022   CL 106 01/10/2022   CO2 29 01/10/2022   Lab Results  Component Value Date   ALT 7 01/10/2022   AST 10 (L) 01/10/2022   ALKPHOS 59 01/10/2022   BILITOT 0.5 01/10/2022   Lab Results  Component Value Date   CHOL 158 03/27/2018   HDL 74 03/27/2018   LDLCALC 69 03/27/2018   TRIG 649 (H) 04/04/2021   CHOLHDL 2.1 03/27/2018    Lab Results  Component Value Date   HGBA1C 4.6 (L) 09/14/2021    Assessment & Plan    1.  Nonobstructive CAD: -Minimal CAD by LHC on 03/2018 -Today patient reports*** -Continue  carvedilol 12.5 mg twice daily  2.  Paroxysmal atrial fibrillation: -Today patient is*** -Continue Eliquis 5 mg twice daily -Continue rate control with amiodarone 200 mg and carvedilol 12.5 mg twice daily  3.  Chronic HFpEF /ESRD on HD: -Patient is***on examination -Today patient reports*** -Patient has HD on M/W/F -Volume currently managed by nephrology   4.  Peripheral artery disease: -Followed by [facility] with procedure 04/2018  5.  Essential HTN: -Patient's blood pressure today was*** -Continue amlodipine and carvedilol as noted above       Disposition: Follow-up with Raymond Rouge, MD or APP in *** months {Are you ordering a CV Procedure (e.g. stress test, cath, DCCV, TEE, etc)?   Press F2        :[id number]}   Medication Adjustments/Labs and Tests Ordered: Current medicines are reviewed at length with the patient today.  Concerns regarding medicines are outlined above.   Signed, Mable Fill, Marissa Nestle, NP 04/18/2022, 11:48 AM [facility]  Note:  This document was prepared using Dragon voice recognition software and may include unintentional dictation errors.

## 2022-04-20 ENCOUNTER — Ambulatory Visit: Payer: Medicaid Other | Admitting: Nurse Practitioner

## 2022-04-20 DIAGNOSIS — I480 Paroxysmal atrial fibrillation: Secondary | ICD-10-CM

## 2022-05-20 LAB — PROINSULIN/INSULIN RATIO
Insulin: 15 u[IU]/mL
Insulin: 4.8 u[IU]/mL
Proinsulin: 6 pmol/L
Proinsulin: 6.7 pmol/L

## 2022-06-08 ENCOUNTER — Ambulatory Visit: Payer: Medicare Other | Admitting: Podiatry

## 2022-07-03 ENCOUNTER — Emergency Department (HOSPITAL_COMMUNITY): Payer: Medicare Other

## 2022-07-03 ENCOUNTER — Inpatient Hospital Stay (HOSPITAL_COMMUNITY)
Admission: EM | Admit: 2022-07-03 | Discharge: 2022-07-08 | DRG: 640 | Disposition: A | Discharge status: Home Health | Payer: Medicare Other | Attending: Family Medicine | Admitting: Family Medicine

## 2022-07-03 DIAGNOSIS — J449 Chronic obstructive pulmonary disease, unspecified: Secondary | ICD-10-CM | POA: Diagnosis not present

## 2022-07-03 DIAGNOSIS — E875 Hyperkalemia: Secondary | ICD-10-CM | POA: Diagnosis not present

## 2022-07-03 DIAGNOSIS — I132 Hypertensive heart and chronic kidney disease with heart failure and with stage 5 chronic kidney disease, or end stage renal disease: Secondary | ICD-10-CM | POA: Diagnosis present

## 2022-07-03 DIAGNOSIS — Z833 Family history of diabetes mellitus: Secondary | ICD-10-CM

## 2022-07-03 DIAGNOSIS — I4510 Unspecified right bundle-branch block: Secondary | ICD-10-CM | POA: Diagnosis not present

## 2022-07-03 DIAGNOSIS — N2581 Secondary hyperparathyroidism of renal origin: Secondary | ICD-10-CM | POA: Diagnosis present

## 2022-07-03 DIAGNOSIS — R34 Anuria and oliguria: Secondary | ICD-10-CM | POA: Diagnosis present

## 2022-07-03 DIAGNOSIS — M898X9 Other specified disorders of bone, unspecified site: Secondary | ICD-10-CM | POA: Diagnosis present

## 2022-07-03 DIAGNOSIS — E162 Hypoglycemia, unspecified: Secondary | ICD-10-CM | POA: Diagnosis not present

## 2022-07-03 DIAGNOSIS — Z992 Dependence on renal dialysis: Secondary | ICD-10-CM

## 2022-07-03 DIAGNOSIS — R7989 Other specified abnormal findings of blood chemistry: Secondary | ICD-10-CM

## 2022-07-03 DIAGNOSIS — I440 Atrioventricular block, first degree: Secondary | ICD-10-CM | POA: Diagnosis present

## 2022-07-03 DIAGNOSIS — D631 Anemia in chronic kidney disease: Secondary | ICD-10-CM | POA: Diagnosis present

## 2022-07-03 DIAGNOSIS — E1151 Type 2 diabetes mellitus with diabetic peripheral angiopathy without gangrene: Secondary | ICD-10-CM | POA: Diagnosis present

## 2022-07-03 DIAGNOSIS — I4821 Permanent atrial fibrillation: Secondary | ICD-10-CM | POA: Diagnosis present

## 2022-07-03 DIAGNOSIS — N186 End stage renal disease: Secondary | ICD-10-CM | POA: Diagnosis not present

## 2022-07-03 DIAGNOSIS — A0472 Enterocolitis due to Clostridium difficile, not specified as recurrent: Secondary | ICD-10-CM | POA: Diagnosis present

## 2022-07-03 DIAGNOSIS — G4700 Insomnia, unspecified: Secondary | ICD-10-CM | POA: Diagnosis not present

## 2022-07-03 DIAGNOSIS — Z8619 Personal history of other infectious and parasitic diseases: Secondary | ICD-10-CM

## 2022-07-03 DIAGNOSIS — Z8249 Family history of ischemic heart disease and other diseases of the circulatory system: Secondary | ICD-10-CM

## 2022-07-03 DIAGNOSIS — U071 COVID-19: Secondary | ICD-10-CM | POA: Diagnosis present

## 2022-07-03 DIAGNOSIS — Z91158 Patient's noncompliance with renal dialysis for other reason: Secondary | ICD-10-CM

## 2022-07-03 DIAGNOSIS — I4892 Unspecified atrial flutter: Secondary | ICD-10-CM | POA: Diagnosis present

## 2022-07-03 DIAGNOSIS — I5042 Chronic combined systolic (congestive) and diastolic (congestive) heart failure: Secondary | ICD-10-CM | POA: Diagnosis present

## 2022-07-03 DIAGNOSIS — I480 Paroxysmal atrial fibrillation: Secondary | ICD-10-CM | POA: Diagnosis present

## 2022-07-03 DIAGNOSIS — E11649 Type 2 diabetes mellitus with hypoglycemia without coma: Secondary | ICD-10-CM | POA: Diagnosis not present

## 2022-07-03 DIAGNOSIS — Z1152 Encounter for screening for COVID-19: Secondary | ICD-10-CM

## 2022-07-03 DIAGNOSIS — I444 Left anterior fascicular block: Secondary | ICD-10-CM | POA: Diagnosis present

## 2022-07-03 DIAGNOSIS — I10 Essential (primary) hypertension: Secondary | ICD-10-CM | POA: Diagnosis present

## 2022-07-03 DIAGNOSIS — I2510 Atherosclerotic heart disease of native coronary artery without angina pectoris: Secondary | ICD-10-CM | POA: Diagnosis present

## 2022-07-03 DIAGNOSIS — E1129 Type 2 diabetes mellitus with other diabetic kidney complication: Secondary | ICD-10-CM | POA: Diagnosis present

## 2022-07-03 DIAGNOSIS — Z7901 Long term (current) use of anticoagulants: Secondary | ICD-10-CM

## 2022-07-03 DIAGNOSIS — R0602 Shortness of breath: Secondary | ICD-10-CM

## 2022-07-03 DIAGNOSIS — F1410 Cocaine abuse, uncomplicated: Secondary | ICD-10-CM | POA: Diagnosis present

## 2022-07-03 DIAGNOSIS — Z79899 Other long term (current) drug therapy: Secondary | ICD-10-CM

## 2022-07-03 DIAGNOSIS — E872 Acidosis, unspecified: Secondary | ICD-10-CM | POA: Diagnosis present

## 2022-07-03 DIAGNOSIS — F17210 Nicotine dependence, cigarettes, uncomplicated: Secondary | ICD-10-CM | POA: Diagnosis present

## 2022-07-03 DIAGNOSIS — D696 Thrombocytopenia, unspecified: Secondary | ICD-10-CM | POA: Diagnosis present

## 2022-07-03 DIAGNOSIS — Z7951 Long term (current) use of inhaled steroids: Secondary | ICD-10-CM

## 2022-07-03 DIAGNOSIS — E1122 Type 2 diabetes mellitus with diabetic chronic kidney disease: Secondary | ICD-10-CM | POA: Diagnosis present

## 2022-07-03 LAB — I-STAT VENOUS BLOOD GAS, ED
Acid-base deficit: 2 mmol/L (ref 0.0–2.0)
Bicarbonate: 24.4 mmol/L (ref 20.0–28.0)
Calcium, Ion: 0.9 mmol/L — ABNORMAL LOW (ref 1.15–1.40)
HCT: 34 % — ABNORMAL LOW (ref 39.0–52.0)
Hemoglobin: 11.6 g/dL — ABNORMAL LOW (ref 13.0–17.0)
O2 Saturation: 52 %
Potassium: 7.3 mmol/L (ref 3.5–5.1)
Sodium: 137 mmol/L (ref 135–145)
TCO2: 26 mmol/L (ref 22–32)
pCO2, Ven: 50.1 mmHg (ref 44–60)
pH, Ven: 7.295 (ref 7.25–7.43)
pO2, Ven: 31 mmHg — CL (ref 32–45)

## 2022-07-03 LAB — CBC WITH DIFFERENTIAL/PLATELET
Abs Immature Granulocytes: 0.01 10*3/uL (ref 0.00–0.07)
Basophils Absolute: 0 10*3/uL (ref 0.0–0.1)
Basophils Relative: 0 %
Eosinophils Absolute: 0 10*3/uL (ref 0.0–0.5)
Eosinophils Relative: 0 %
HCT: 35.3 % — ABNORMAL LOW (ref 39.0–52.0)
Hemoglobin: 11.2 g/dL — ABNORMAL LOW (ref 13.0–17.0)
Immature Granulocytes: 0 %
Lymphocytes Relative: 14 %
Lymphs Abs: 0.7 10*3/uL (ref 0.7–4.0)
MCH: 29.8 pg (ref 26.0–34.0)
MCHC: 31.7 g/dL (ref 30.0–36.0)
MCV: 93.9 fL (ref 80.0–100.0)
Monocytes Absolute: 0.5 10*3/uL (ref 0.1–1.0)
Monocytes Relative: 9 %
Neutro Abs: 3.8 10*3/uL (ref 1.7–7.7)
Neutrophils Relative %: 77 %
Platelets: 85 10*3/uL — ABNORMAL LOW (ref 150–400)
RBC: 3.76 MIL/uL — ABNORMAL LOW (ref 4.22–5.81)
RDW: 18 % — ABNORMAL HIGH (ref 11.5–15.5)
WBC: 4.9 10*3/uL (ref 4.0–10.5)
nRBC: 0 % (ref 0.0–0.2)

## 2022-07-03 LAB — HEPATIC FUNCTION PANEL
ALT: 45 U/L — ABNORMAL HIGH (ref 0–44)
AST: 56 U/L — ABNORMAL HIGH (ref 15–41)
Albumin: 3.5 g/dL (ref 3.5–5.0)
Alkaline Phosphatase: 104 U/L (ref 38–126)
Bilirubin, Direct: <0.1 mg/dL (ref 0.0–0.2)
Total Bilirubin: 1.8 mg/dL — ABNORMAL HIGH (ref 0.3–1.2)
Total Protein: 8.1 g/dL (ref 6.5–8.1)

## 2022-07-03 LAB — I-STAT CHEM 8, ED
BUN: 118 mg/dL — ABNORMAL HIGH (ref 8–23)
Calcium, Ion: 0.87 mmol/L — CL (ref 1.15–1.40)
Chloride: 103 mmol/L (ref 98–111)
Creatinine, Ser: 17.1 mg/dL — ABNORMAL HIGH (ref 0.61–1.24)
Glucose, Bld: 88 mg/dL (ref 70–99)
HCT: 34 % — ABNORMAL LOW (ref 39.0–52.0)
Hemoglobin: 11.6 g/dL — ABNORMAL LOW (ref 13.0–17.0)
Potassium: 7.3 mmol/L (ref 3.5–5.1)
Sodium: 135 mmol/L (ref 135–145)
TCO2: 24 mmol/L (ref 22–32)

## 2022-07-03 LAB — BASIC METABOLIC PANEL
Anion gap: 22 — ABNORMAL HIGH (ref 5–15)
BUN: 116 mg/dL — ABNORMAL HIGH (ref 8–23)
CO2: 21 mmol/L — ABNORMAL LOW (ref 22–32)
Calcium: 8 mg/dL — ABNORMAL LOW (ref 8.9–10.3)
Chloride: 97 mmol/L — ABNORMAL LOW (ref 98–111)
Creatinine, Ser: 15.83 mg/dL — ABNORMAL HIGH (ref 0.61–1.24)
GFR, Estimated: 3 mL/min — ABNORMAL LOW (ref >60)
Glucose, Bld: 89 mg/dL (ref 70–99)
Potassium: >7.5 mmol/L (ref 3.5–5.1)
Sodium: 140 mmol/L (ref 135–145)

## 2022-07-03 LAB — RESP PANEL BY RT-PCR (RSV, FLU A&B, COVID)  RVPGX2
Influenza A by PCR: NEGATIVE
Influenza B by PCR: NEGATIVE
Resp Syncytial Virus by PCR: NEGATIVE
SARS Coronavirus 2 by RT PCR: POSITIVE — AB

## 2022-07-03 LAB — CBG MONITORING, ED
Glucose-Capillary: 15 mg/dL — CL (ref 70–99)
Glucose-Capillary: 52 mg/dL — ABNORMAL LOW (ref 70–99)
Glucose-Capillary: 69 mg/dL — ABNORMAL LOW (ref 70–99)
Glucose-Capillary: 99 mg/dL (ref 70–99)
Glucose-Capillary: 24 mg/dL — CL (ref 70–99)
Glucose-Capillary: 32 mg/dL — CL (ref 70–99)
Glucose-Capillary: 76 mg/dL (ref 70–99)

## 2022-07-03 LAB — LACTIC ACID, PLASMA: Lactic Acid, Venous: 2.7 mmol/L (ref 0.5–1.9)

## 2022-07-03 LAB — C DIFFICILE QUICK SCREEN W PCR REFLEX
C Diff antigen: POSITIVE — AB
C Diff toxin: NEGATIVE

## 2022-07-03 LAB — CLOSTRIDIUM DIFFICILE BY PCR, REFLEXED: Toxigenic C. Difficile by PCR: POSITIVE — AB

## 2022-07-03 MED ORDER — INSULIN ASPART 100 UNIT/ML IV SOLN
5.0000 [IU] | Freq: Once | INTRAVENOUS | Status: AC
  Administered 2022-07-03: 5 [IU] via INTRAVENOUS

## 2022-07-03 MED ORDER — ENOXAPARIN SODIUM 30 MG/0.3ML IJ SOSY: 30.0000 mg | PREFILLED_SYRINGE | INTRAMUSCULAR | Status: DC

## 2022-07-03 MED ORDER — SODIUM ZIRCONIUM CYCLOSILICATE 10 G PO PACK
10.0000 g | PACK | Freq: Once | ORAL | Status: AC
  Administered 2022-07-03: 10 g via ORAL
  Filled 2022-07-03: qty 1

## 2022-07-03 MED ORDER — DEXTROSE 50 % IV SOLN
50.0000 mL | Freq: Once | INTRAVENOUS | Status: AC
  Administered 2022-07-03: 50 mL via INTRAVENOUS

## 2022-07-03 MED ORDER — DEXTROSE 50 % IV SOLN
1.0000 | INTRAVENOUS | Status: DC | PRN
  Administered 2022-07-03 – 2022-07-04 (×2): 50 mL via INTRAVENOUS
  Filled 2022-07-03 (×2): qty 50

## 2022-07-03 MED ORDER — GLUCOSE 4 G PO CHEW: 4.0000 | CHEWABLE_TABLET | Freq: Once | ORAL | Status: DC

## 2022-07-03 MED ORDER — DEXTROSE 50 % IV SOLN
1.0000 | Freq: Once | INTRAVENOUS | Status: AC
  Administered 2022-07-03: 50 mL via INTRAVENOUS
  Filled 2022-07-03: qty 50

## 2022-07-03 MED ORDER — SODIUM CHLORIDE 0.9% FLUSH
3.0000 mL | Freq: Two times a day (BID) | INTRAVENOUS | Status: DC
  Administered 2022-07-04 – 2022-07-08 (×9): 3 mL via INTRAVENOUS

## 2022-07-03 MED ORDER — DEXTROSE 50 % IV SOLN
INTRAVENOUS | Status: AC
  Filled 2022-07-03: qty 50

## 2022-07-03 MED ORDER — GLUCAGON HCL RDNA (DIAGNOSTIC) 1 MG IJ SOLR
INTRAMUSCULAR | Status: AC
  Filled 2022-07-03: qty 1

## 2022-07-03 MED ORDER — CALCIUM GLUCONATE-NACL 2-0.675 GM/100ML-% IV SOLN
2.0000 g | Freq: Once | INTRAVENOUS | Status: AC
  Administered 2022-07-03: 2000 mg via INTRAVENOUS
  Filled 2022-07-03: qty 100

## 2022-07-03 MED ORDER — CHLORHEXIDINE GLUCONATE CLOTH 2 % EX PADS
6.0000 | MEDICATED_PAD | Freq: Every day | CUTANEOUS | Status: DC
  Administered 2022-07-04: 6 via TOPICAL

## 2022-07-03 MED ORDER — SODIUM CHLORIDE 0.9% FLUSH: 3.0000 mL | INTRAVENOUS | Status: DC | PRN

## 2022-07-03 MED ORDER — SODIUM CHLORIDE 0.9 % IV SOLN: 250.0000 mL | INTRAVENOUS | Status: DC | PRN

## 2022-07-03 MED ORDER — LOPERAMIDE HCL 2 MG PO CAPS
2.0000 mg | ORAL_CAPSULE | ORAL | Status: DC | PRN
  Administered 2022-07-04: 2 mg via ORAL
  Filled 2022-07-03 (×2): qty 1

## 2022-07-03 MED ORDER — GLUCOSE 4 G PO CHEW
CHEWABLE_TABLET | ORAL | Status: AC
  Filled 2022-07-03: qty 1

## 2022-07-03 NOTE — Assessment & Plan Note (Signed)
-   Secondary to decreased oral intake in the setting of COVID viral infection -advise increase oral intake of food and sweetened beverages overnight -PRN dextrose for CBG <60. Pt is anuric so will not be able to any continuous dextrose overnight

## 2022-07-03 NOTE — ED Notes (Signed)
Pt noted to be hypoglycemic in triage ; dextrose 50% given

## 2022-07-03 NOTE — Assessment & Plan Note (Signed)
Continue blood pressure control with amlodipine and carvedilol.  Continue close blood pressure monitoring.

## 2022-07-03 NOTE — H&P (Addendum)
History and Physical    PatientRUSSEL [name] IEP:[id number] DOB: 1956/04/30 DOA: 07/03/2022 DOS: the patient was seen and examined on 07/04/2022 PCP: [name], FNP  Patient coming from: Home  Chief Complaint:  Chief Complaint  Patient presents with   Extremity Weakness   Hypoglycemia   HPI: Raymond [name] is a 66 y.o. male with medical history significant of ESRD on HD T/Th/Sat, paroxysmal atrial fibrillation not on anticoagulation, hypertension,HFrEF, PAD, COPD, secondary hyperparathyroidism who presents with concerns of weakness.  Reports for the past 2 weeks he has had almost daily diarrhea.  This past week began to have a cough and some increasing shortness of breath.  For the past 3 days he became so weak that he could only lay in bed and continue to soil himself in bed with his diarrhea.  Has not really been eating or drinking. He lives in a boarding house and one of his roommates was sick.  He was finally able to get out of bed to call for help today. Has missed 2 sessions of dialysis this week due to illness.  On EMS arrival, noted to be hypoglycemic with CBG of 46 and given 15 g of oral glucose.  Received additional D50 in the ED.  Lab work revealing for severe hypokalemia of 7.5.  Creatinine of 15.83 with anion gap of 22. EKG on my review showing atrial flutter with nonspecific ST abnormalities.  No significant T wave changes. WBC of 4.9 with lactate of 2.7, hemoglobin appears concentrated 11.2 from baseline around 7-8.  Mild elevation in LFTs with AST of 56, ALT of 45, total bilirubin 1.8. COVID PCR is positive.  He was given IV insulin with dextrose, IV calcium gluconate and Lokelma in the ED for his hyperkalemia.  EDP also consulted nephrology who will take him for dialysis overnight.  Review of Systems: As mentioned in the history of present illness. All other systems reviewed and are negative. Past Medical History:  Diagnosis Date   Anemia    CAD (coronary  artery disease)    a. 03/2018: cath showing 69 to 30% stenosis along the LAD, luminal irregularities along the RCA, and angiographically normal LCx   COPD (chronic obstructive pulmonary disease) (HCC)    ESRD (end stage renal disease) on dialysis [facility])    "MWF; Jeneen Rinks" (07/22/17)   Hepatitis C    Still positive s/p liver biopsy at [facility]  and interferon therapy for 6 months. Most recent lab work was on 10/24/12   Hepatitis C    "took the tx; gone now" (12/05/2016)  Was treated   History of blood transfusion ~ 2012/2013   "related to my kidneys; blood was low"   Hypertension    Peripheral arterial disease ([place])    Substance abuse ([place])    Thyroid disease    Type 2 diabetes mellitus with other diabetic kidney complication ([place]) 6/60/6301   Past Surgical History:  Procedure Laterality Date   ABDOMINAL AORTOGRAM W/LOWER EXTREMITY N/A 02/08/2018   Procedure: ABDOMINAL AORTOGRAM W/LOWER EXTREMITY;  Surgeon: Conrad Rutherford, MD;  Location: [place] CV LAB;  Service: Cardiovascular;  Laterality: N/A;   ABDOMINAL AORTOGRAM W/LOWER EXTREMITY Bilateral 06/15/2020   Procedure: ABDOMINAL AORTOGRAM W/LOWER EXTREMITY;  Surgeon: Waynetta Sandy, MD;  Location: [place] CV LAB;  Service: Cardiovascular;  Laterality: Bilateral;   AV FISTULA PLACEMENT Left Aug. 2013 ?   BIOPSY  01/17/2020   Procedure: BIOPSY;  Surgeon: Jackquline Denmark, MD;  Location: [facility] ENDOSCOPY;  Service: Endoscopy;;   COLONOSCOPY WITH PROPOFOL N/A 08/21/2018   Procedure: COLONOSCOPY WITH PROPOFOL;  Surgeon: Yetta Flock, MD;  Location: [place];  Service: Gastroenterology;  Laterality: N/A;   ENTEROSCOPY N/A 01/17/2020   Procedure: ENTEROSCOPY;  Surgeon: Jackquline Denmark, MD;  Location: [place] ENDOSCOPY;  Service: Endoscopy;  Laterality: N/A;   ENTEROSCOPY N/A 07/31/2020   Procedure: ENTEROSCOPY;  Surgeon: Doran Stabler, MD;  Location: [place];  Service: Gastroenterology;  Laterality: N/A;   ENTEROSCOPY N/A  01/27/2021   Procedure: ENTEROSCOPY;  Surgeon: Yetta Flock, MD;  Location: [facility] ENDOSCOPY;  Service: Gastroenterology;  Laterality: N/A;   ESOPHAGOGASTRODUODENOSCOPY (EGD) WITH PROPOFOL N/A 08/20/2018   Procedure: ESOPHAGOGASTRODUODENOSCOPY (EGD) WITH PROPOFOL;  Surgeon: Gatha Mayer, MD;  Location: [place];  Service: Endoscopy;  Laterality: N/A;   FEMORAL-POPLITEAL BYPASS GRAFT Left 04/11/2018   Procedure: BYPASS GRAFT FEMORAL-POPLITEAL ARTERY LEFT LEG;  Surgeon: Rosetta Posner, MD;  [facility] OR;  Service: Vascular;  Laterality: Left;   GIVENS CAPSULE STUDY N/A 07/31/2020   Procedure: GIVENS CAPSULE STUDY;  Surgeon: Doran Stabler, MD;  Location: [place];  Service: Gastroenterology;  Laterality: N/A;   HOT HEMOSTASIS N/A 08/20/2018   Procedure: HOT HEMOSTASIS (ARGON PLASMA COAGULATION/BICAP);  Surgeon: Gatha Mayer, MD;  Location: [facility] ENDOSCOPY;  Service: Endoscopy;  Laterality: N/A;   HOT HEMOSTASIS N/A 01/17/2020   Procedure: HOT HEMOSTASIS (ARGON PLASMA COAGULATION/BICAP);  Surgeon: Jackquline Denmark, MD;  Location: [facility] ENDOSCOPY;  Service: Endoscopy;  Laterality: N/A;   HOT HEMOSTASIS N/A 01/27/2021   Procedure: HOT HEMOSTASIS (ARGON PLASMA COAGULATION/BICAP);  Surgeon: Yetta Flock, MD;  Location: [facility] ENDOSCOPY;  Service: Gastroenterology;  Laterality: N/A;   LEFT HEART CATH AND CORONARY ANGIOGRAPHY N/A 03/29/2018   Procedure: LEFT HEART CATH AND CORONARY ANGIOGRAPHY;  Surgeon: Nelva Bush, MD;  Location: [place] CV LAB;  Service: Cardiovascular;  Laterality: N/A;   LIVER BIOPSY     ORIF ULNAR FRACTURE Left 05/18/2017   Procedure: OPEN REDUCTION INTERNAL FIXATION (ORIF) ULNAR FRACTURE;  Surgeon: Iran Planas, MD;  Location: [place];  Service: Orthopedics;  Laterality: Left;   PERIPHERAL VASCULAR INTERVENTION Right 06/15/2020   Procedure: PERIPHERAL VASCULAR INTERVENTION;  Surgeon: Waynetta Sandy, MD;  Location: [place] CV LAB;  Service:  Cardiovascular;  Laterality: Right;   POLYPECTOMY  08/21/2018   Procedure: POLYPECTOMY;  Surgeon: Yetta Flock, MD;  Location: [place];  Service: Gastroenterology;;   Earney Mallet N/A 12/11/2012   Procedure: Earney Mallet;  Surgeon: Serafina Mitchell, MD;  Location: [facility] CATH LAB;  Service: Cardiovascular;  Laterality: N/A;   WOUND EXPLORATION Left 06/15/2020   Procedure: GROIN  EXPLORATION;  Surgeon: Waynetta Sandy, MD;  Location: [place];  Service: Vascular;  Laterality: Left;   Social History:  reports that he has been smoking cigarettes. He has a 25.00 pack-year smoking history. He has never used smokeless tobacco. He reports current alcohol use of about 10.0 standard drinks of alcohol per week. He reports that he does not currently use drugs after having used the following drugs: Cocaine.  No Known Allergies  Family History  Problem Relation Age of Onset   Diabetes Father    Hypertension Father    Heart disease Father     Prior to Admission medications   Medication Sig Start Date End Date Taking? Authorizing Provider  albuterol (VENTOLIN HFA) 108 (90 Base) MCG/ACT inhaler Inhale 3 puffs into the lungs daily as needed for shortness of breath or wheezing. 11/06/19  [provider]  amiodarone (PACERONE) 200 MG tablet Take 200 mg by mouth daily. 06/17/21   [provider]  amLODipine (NORVASC) 5 MG tablet Take 5 mg by mouth daily. 07/22/21   [provider]  Adair Patter 200-25 MCG/ACT AEPB Inhale 2 puffs into the lungs daily. 09/17/21   [provider]  calcitRIOL (ROCALTROL) 0.5 MCG capsule Take 1 capsule (0.5 mcg total) by mouth Every Tuesday,Thursday,and Saturday with dialysis. 12/02/21   Mariel Aloe, MD  carvedilol (COREG) 12.5 MG tablet Take 2 tablets (25 mg total) by mouth every morning AND 1 tablet (12.5 mg total) every evening. 12/29/21   Deberah Pelton, NP  cinacalcet (SENSIPAR) 30 MG tablet Take 6 tablets (180 mg total) by mouth  Every Tuesday,Thursday,and Saturday with dialysis. 12/12/20   Mercy Riding, MD  Darbepoetin Alfa (ARANESP) 200 MCG/0.4ML SOSY injection Inject 0.4 mLs (200 mcg total) into the vein every Tuesday with hemodialysis. 05/25/21   France Ravens, MD  multivitamin (RENA-VIT) TABS tablet Take 1 tablet by mouth at bedtime. 09/17/21   Sheikh, Omair Latif, DO  pantoprazole (PROTONIX) 40 MG tablet Take 1 tablet (40 mg total) by mouth 2 (two) times daily before a meal. Patient taking differently: Take 40 mg by mouth daily. 09/17/21   Raiford Noble Latif, DO  sertraline (ZOLOFT) 25 MG tablet Take 25 mg by mouth daily. 12/07/21   [provider]  sevelamer carbonate (RENVELA) 800 MG tablet Take 5 tablets (4,000 mg total) by mouth 3 (three) times daily with meals. Patient taking differently: Take 4,000 mg by mouth See admin instructions. Take 4000 mg by mouth twice a day with meals 09/17/21   Raiford Noble Latif, DO  apixaban (ELIQUIS) 5 MG TABS tablet Take 1 tablet (5 mg total) by mouth 2 (two) times daily. 06/22/20 08/03/20  Karoline Caldwell, PA-C    Physical Exam: Vitals:   07/03/22 2045 07/03/22 2130 07/03/22 2200 07/03/22 2353  BP: (!) 151/110 138/85 (!) 143/86 119/85  Pulse: 97 93 100 (!) 103  Resp: '17 19 16 20  '$ Temp:   98 F (36.7 C) 98.7 F (37.1 C)  TempSrc:   Oral Oral  SpO2: 100% 99% 100% 100%   Constitutional: NAD, calm, comfortable, thin elderly nontoxic-appearing male laying in bed Eyes: lids and conjunctivae normal ENMT: Mucous membranes are moist. Neck: normal, supple Respiratory: clear to auscultation bilaterally, no wheezing, no crackles. Normal respiratory effort. No accessory muscle use.  Cardiovascular: Regular rate and rhythm, no murmurs / rubs / gallops. No extremity edema. Left forearm AV fistula with palpable thrills.  Abdomen:soft,non-distended, no tenderness,.Bowel sounds positive.  Musculoskeletal: no clubbing / cyanosis. No joint deformity upper and lower extremities. Good  ROM, no contractures. Normal muscle tone.  Skin: no rashes, lesions, ulcers.  Neurologic: CN 2-12 grossly intact. Strength 5/5 in all 4.  Psychiatric: Normal judgment and insight. Alert and oriented x 3. Normal mood. Data Reviewed:  See HPI  Assessment and Plan: * Hyperkalemia -K of 7.5 without significant EKG changes -Given IV calcium gluconate, insulin with dextrose and Lokelma -Nephrology to take urgently for dialysis overnight.  Continue to follow potassium in the morning.  ESRD (end stage renal disease) ([place]) -on HD T/Th/Sat. Has missed last 2 sessions due to illness -nephrology to take for urgent dialysis overnight due to hyperkalemia  COPD (chronic obstructive pulmonary disease) ([place]) - Not in acute exacerbation.  Continue home bronchodilator pending med rec.  Paroxysmal atrial fibrillation (HCC) - Currently rate controlled.  Unclear why  he does not appear to be anticoagulation. -Continue home meds pending med rec  Hypertension - Mildly elevated.  Continue antihypertensives pending med rec.  Clostridium difficile diarrhea -has persistent diarrhea for past 2 weeks. C.diff positive for antigen and PCR. -will start oral vancomycin  Abnormal LFTs -likely secondary to COVID-viral infection -follow repeat lab tomorrow  Metabolic acidosis -due to worsening renal function from missed dialysis. Follow after dialysis.   COVID-19 virus infection -symptoms have been greater than 5 days and he is not hypoxic so outside of window for any antiviral to be efficacious.    Hypoglycemia - Secondary to decreased oral intake in the setting of COVID viral infection -advise increase oral intake of food and sweetened beverages overnight -PRN dextrose for CBG <60. Pt is anuric so will not be able to any continuous dextrose overnight      Advance Care Planning:   Code Status: Full Code   Consults: nephrology  Family Communication: none at bedside  Severity of Illness: The  appropriate patient status for this patient is OBSERVATION. Observation status is judged to be reasonable and necessary in order to provide the required intensity of service to ensure the patient's safety. The patient's presenting symptoms, physical exam findings, and initial radiographic and laboratory data in the context of their medical condition is felt to place them at decreased risk for further clinical deterioration. Furthermore, it is anticipated that the patient will be medically stable for discharge from the hospital within 2 midnights of admission.   Author: Orene Desanctis, DO 07/04/2022 1:13 AM  For on call review [email or web address].

## 2022-07-03 NOTE — ED Triage Notes (Signed)
Pt to ED via EMS c/o weakness and flu like symptoms x 3 days. CBG 46, 15GRAMS oral glucose given by EMS.   Last VS : 170/107, hr 94,

## 2022-07-03 NOTE — Assessment & Plan Note (Signed)
Continue rate control with amiodarone.  He is not on anticoagulation.  Old records personally reviewed in 06/23 outpatient cardiology listed apixaban on medication list.   Will resume apixaban with close monitoring.

## 2022-07-03 NOTE — ED Notes (Signed)
Initial CBG 15 on pt right hand fingerstick. Recheck immediately afterwards on pt left hand fingerstick, pt CBG reads 52.

## 2022-07-03 NOTE — Assessment & Plan Note (Signed)
Hyperkalemia, hypocalcemia.   Patient had urgent HD with improvement in his electrolytes.  Follow up renal function with serum K at 6.3, with bicarbonate at 23.  Plan check BMP this morning, if K continue elevated will add sodium zorconium.  Continue inpatient renal replacement therapy per nephrology recommendations.   Metabolic bone disease, continue with calcitriol, cinacalcet and sevelamer.   Anemia of chronic renal disease, hgb has been stable with no current indications for PRBC transfusion.

## 2022-07-03 NOTE — Assessment & Plan Note (Addendum)
No signs of acute viral pneumonia.  Paxlovid contraindicated in ESRD.

## 2022-07-03 NOTE — Assessment & Plan Note (Signed)
No signs of exacerbation, continue bronchodilator therapy.

## 2022-07-03 NOTE — ED Provider Notes (Signed)
[place] EMERGENCY DEPARTMENT Provider Note   CSN: [id number] Arrival date & time: 07/03/22  1858     History {Add pertinent medical, surgical, social history, OB history to HPI:1} No chief complaint on file.   Raymond [name] is a 66 y.o. male.  Past medical history of CAD, COPD, HFpEF EF 50 to 55% 03/2021, ESRD on TTS HD, hepatitis C, hypertension, diabetes, polysubstance use.  He presents to the emergency department for complaints of weakness, shortness of breath, fatigue, and diarrhea over the past multiple days.  He last dialyzed on Tuesday, 5 days ago, during that dialysis session he started to feel bad and had a cough.  Because of his coughing he did not want to go on Thursday and expose other people.  But since Thursday he has been feeling extremely poor, unable to get out of bed.  Has had multiple episodes of diarrhea in his bed.  Has been nauseous without vomiting.  Has had a cough and some shortness of breath.  Has not had anything to eat over the past 3 days.  Has a reported diagnosis of diabetes in the chart, but he denies any diagnosis of diabetes and does not take any medications for it.  He denies any chest pain.  Has had some headaches.  No photophobia or phonophobia.  No neck pain or stiffness.  Had night sweats last night.  With EMS was hypoglycemic down to 46.  On arrival had hypoglycemia to 15, was given D50 and his blood glucose came up to 52.  HPI     Home Medications Prior to Admission medications   Medication Sig Start Date End Date Taking? Authorizing Provider  albuterol (VENTOLIN HFA) 108 (90 Base) MCG/ACT inhaler Inhale 3 puffs into the lungs daily as needed for shortness of breath or wheezing. 11/06/19   [provider]  amiodarone (PACERONE) 200 MG tablet Take 200 mg by mouth daily. 06/17/21   [provider]  amLODipine (NORVASC) 5 MG tablet Take 5 mg by mouth daily. 07/22/21   [provider]  Adair Patter 200-25  MCG/ACT AEPB Inhale 2 puffs into the lungs daily. 09/17/21   [provider]  calcitRIOL (ROCALTROL) 0.5 MCG capsule Take 1 capsule (0.5 mcg total) by mouth Every Tuesday,Thursday,and Saturday with dialysis. 12/02/21   Mariel Aloe, MD  carvedilol (COREG) 12.5 MG tablet Take 2 tablets (25 mg total) by mouth every morning AND 1 tablet (12.5 mg total) every evening. 12/29/21   Deberah Pelton, NP  cinacalcet (SENSIPAR) 30 MG tablet Take 6 tablets (180 mg total) by mouth Every Tuesday,Thursday,and Saturday with dialysis. 12/12/20   Mercy Riding, MD  Darbepoetin Alfa (ARANESP) 200 MCG/0.4ML SOSY injection Inject 0.4 mLs (200 mcg total) into the vein every Tuesday with hemodialysis. 05/25/21   France Ravens, MD  multivitamin (RENA-VIT) TABS tablet Take 1 tablet by mouth at bedtime. 09/17/21   Sheikh, Omair Latif, DO  pantoprazole (PROTONIX) 40 MG tablet Take 1 tablet (40 mg total) by mouth 2 (two) times daily before a meal. Patient taking differently: Take 40 mg by mouth daily. 09/17/21   Raiford Noble Latif, DO  sertraline (ZOLOFT) 25 MG tablet Take 25 mg by mouth daily. 12/07/21   [provider]  sevelamer carbonate (RENVELA) 800 MG tablet Take 5 tablets (4,000 mg total) by mouth 3 (three) times daily with meals. Patient taking differently: Take 4,000 mg by mouth See admin instructions. Take 4000 mg by mouth twice a day with  meals 09/17/21   Sheikh, Omair Latif, DO  apixaban (ELIQUIS) 5 MG TABS tablet Take 1 tablet (5 mg total) by mouth 2 (two) times daily. 06/22/20 08/03/20  Karoline Caldwell, PA-C      Allergies    Patient has no known allergies.    Review of Systems   Review of Systems  Physical Exam Updated Vital Signs BP (!) 153/91   Pulse 92   Temp 97.6 F (36.4 C)   Resp 20   SpO2 100%  Physical Exam Vitals and nursing note reviewed.  Constitutional:      Appearance: He is well-developed. He is ill-appearing and diaphoretic.  HENT:     Head: Normocephalic and atraumatic.      Right Ear: External ear normal.     Left Ear: External ear normal.     Nose: Nose normal.     Mouth/Throat:     Mouth: Mucous membranes are moist.     Pharynx: Oropharynx is clear.  Eyes:     Conjunctiva/sclera: Conjunctivae normal.  Cardiovascular:     Rate and Rhythm: Normal rate and regular rhythm.     Heart sounds: No murmur heard. Pulmonary:     Effort: Pulmonary effort is normal. No respiratory distress.     Breath sounds: Normal breath sounds. No stridor. No wheezing, rhonchi or rales.  Abdominal:     General: Abdomen is flat.     Palpations: Abdomen is soft.     Tenderness: There is no abdominal tenderness. There is no guarding or rebound.  Musculoskeletal:        General: No swelling.     Cervical back: Neck supple.     Right lower leg: No edema.     Left lower leg: No edema.  Skin:    General: Skin is warm.     Capillary Refill: Capillary refill takes less than 2 seconds.  Neurological:     General: No focal deficit present.     Mental Status: He is alert and oriented to person, place, and time.     Motor: Weakness (Global, 4+/5 in all 4 extremities) present.  Psychiatric:        Mood and Affect: Mood normal.     ED Results / Procedures / Treatments   Labs (all labs ordered are listed, but only abnormal results are displayed) Labs Reviewed  CBG MONITORING, ED - Abnormal; Notable for the following components:      Result Value   Glucose-Capillary 15 (*)    All other components within normal limits  CBG MONITORING, ED - Abnormal; Notable for the following components:   Glucose-Capillary 52 (*)    All other components within normal limits  RESP PANEL BY RT-PCR (RSV, FLU A&B, COVID)  RVPGX2  BASIC METABOLIC PANEL  CBG MONITORING, ED    EKG EKG Interpretation  Date/Time:  Sunday July 03 2022 19:01:26 EST Ventricular Rate:  92 PR Interval:    QRS Duration: 108 QT Interval:  390 QTC Calculation: 482 R Axis:   -59 Text Interpretation: Atrial  flutter with variable A-V block Left anterior fascicular block Minimal voltage criteria for LVH, may be normal variant ( Cornell product ) Nonspecific ST abnormality new Prolonged QT When compared with ECG of 10-Jan-2022 19:26, PREVIOUS ECG IS PRESENT Confirmed by Blanchie Dessert [phone]) on 07/03/2022 7:17:52 PM  Radiology No results found.  Procedures Procedures  {Document cardiac monitor, telemetry assessment procedure when appropriate:1}  Medications Ordered in ED Medications  glucose-Vitamin C 4-0.006 GM per chewable  tablet 4 tablet (has no administration in time range)  sodium chloride flush (NS) 0.9 % injection 3 mL (has no administration in time range)  sodium chloride flush (NS) 0.9 % injection 3 mL (has no administration in time range)  0.9 %  sodium chloride infusion (has no administration in time range)  glucagon (human recombinant) (GLUCAGEN) 1 MG injection (has no administration in time range)  dextrose 50 % solution (has no administration in time range)  dextrose 50 % solution 50 mL (50 mLs Intravenous Given 07/03/22 1915)    ED Course/ Medical Decision Making/ A&P                           Medical Decision Making Amount and/or Complexity of Data Reviewed Labs: ordered.   This is a 66 y.o. male who presents to the emergency department with ***. History is obtained from ***. Medical history reviewed in EMR.  Past medical/surgical history that increases complexity of ED encounter:  ***  Initial Assessment:  With the patient's presentation of ***, most likely diagnosis is ***. Other diagnoses were considered including (but not limited to) ***. These are considered less likely due to history of present illness and physical exam findings.    Initial Plan: ***  Interpretation of Diagnostics: I personally reviewed the *** and my interpretation is as follows:  ***  ***   MDM generated using voice dictation software and may contain dictation errors. Please contact  me for any clarification or with any questions.    {Document critical care time when appropriate:1} {Document review of labs and clinical decision tools ie heart score, Chads2Vasc2 etc:1}  {Document your independent review of radiology images, and any outside records:1} {Document your discussion with family members, caretakers, and with consultants:1} {Document social determinants of health affecting pt's care:1} {Document your decision making why or why not admission, treatments were needed:1} Final Clinical Impression(s) / ED Diagnoses Final diagnoses:  None    Rx / DC Orders ED Discharge Orders     None

## 2022-07-03 NOTE — Assessment & Plan Note (Signed)
-  K of 7.5 without significant EKG changes -Given IV calcium gluconate, insulin with dextrose and Lokelma -Nephrology to take urgently for dialysis overnight.  Continue to follow potassium in the morning.

## 2022-07-03 NOTE — ED Provider Triage Note (Signed)
Emergency Medicine Provider Triage Evaluation Note  Raymond [name] , a 66 y.o. male  was evaluated in triage.  Pt complains of feeling weak and fatigued for last 3days. Has missed 2 dialysis session and reports SOB; has not tried any albuterol inhaler. Was found to have glucose in 68s. Given 15 g glucose orally. Hx of diabetes, but is not on any diabetic drugs. Has not eaten in 3 days per pt.  Review of Systems  Positive: Fatigue, SOB, fevers, chills Negative: Chest pain  Physical Exam  There were no vitals taken for this visit. Gen:   Awake, no distress   Resp:  Normal effort, coarse lungs sounds bilateral lower lobes MSK:   Moves extremities without difficulty  Other:  Interactive, speaking with provider, no neurodeficits  Medical Decision Making  Medically screening exam initiated at 7:00 PM.  Appropriate orders placed.  Raymond [name] was informed that the remainder of the evaluation will be completed by another provider, this initial triage assessment does not replace that evaluation, and the importance of remaining in the ED until their evaluation is complete.     Osvaldo Shipper, [place] 07/03/22 1909

## 2022-07-03 NOTE — Assessment & Plan Note (Signed)
-  due to worsening renal function from missed dialysis. Follow after dialysis.

## 2022-07-03 NOTE — Assessment & Plan Note (Signed)
-  likely secondary to COVID-viral infection -follow repeat lab tomorrow

## 2022-07-04 DIAGNOSIS — I10 Essential (primary) hypertension: Secondary | ICD-10-CM | POA: Diagnosis not present

## 2022-07-04 DIAGNOSIS — I480 Paroxysmal atrial fibrillation: Secondary | ICD-10-CM | POA: Diagnosis not present

## 2022-07-04 DIAGNOSIS — E875 Hyperkalemia: Secondary | ICD-10-CM | POA: Diagnosis present

## 2022-07-04 DIAGNOSIS — E1129 Type 2 diabetes mellitus with other diabetic kidney complication: Secondary | ICD-10-CM | POA: Diagnosis not present

## 2022-07-04 DIAGNOSIS — I132 Hypertensive heart and chronic kidney disease with heart failure and with stage 5 chronic kidney disease, or end stage renal disease: Secondary | ICD-10-CM | POA: Diagnosis present

## 2022-07-04 DIAGNOSIS — Z91158 Patient's noncompliance with renal dialysis for other reason: Secondary | ICD-10-CM | POA: Diagnosis not present

## 2022-07-04 DIAGNOSIS — I4821 Permanent atrial fibrillation: Secondary | ICD-10-CM | POA: Diagnosis present

## 2022-07-04 DIAGNOSIS — A0472 Enterocolitis due to Clostridium difficile, not specified as recurrent: Secondary | ICD-10-CM | POA: Diagnosis not present

## 2022-07-04 DIAGNOSIS — Z992 Dependence on renal dialysis: Secondary | ICD-10-CM | POA: Diagnosis not present

## 2022-07-04 DIAGNOSIS — D631 Anemia in chronic kidney disease: Secondary | ICD-10-CM | POA: Diagnosis present

## 2022-07-04 DIAGNOSIS — I4819 Other persistent atrial fibrillation: Secondary | ICD-10-CM | POA: Diagnosis not present

## 2022-07-04 DIAGNOSIS — R9431 Abnormal electrocardiogram [ECG] [EKG]: Secondary | ICD-10-CM | POA: Diagnosis not present

## 2022-07-04 DIAGNOSIS — N2581 Secondary hyperparathyroidism of renal origin: Secondary | ICD-10-CM | POA: Diagnosis present

## 2022-07-04 DIAGNOSIS — Z1152 Encounter for screening for COVID-19: Secondary | ICD-10-CM | POA: Diagnosis not present

## 2022-07-04 DIAGNOSIS — Z79899 Other long term (current) drug therapy: Secondary | ICD-10-CM | POA: Diagnosis not present

## 2022-07-04 DIAGNOSIS — E1151 Type 2 diabetes mellitus with diabetic peripheral angiopathy without gangrene: Secondary | ICD-10-CM | POA: Diagnosis present

## 2022-07-04 DIAGNOSIS — U071 COVID-19: Secondary | ICD-10-CM | POA: Diagnosis not present

## 2022-07-04 DIAGNOSIS — N186 End stage renal disease: Secondary | ICD-10-CM | POA: Diagnosis present

## 2022-07-04 DIAGNOSIS — I5042 Chronic combined systolic (congestive) and diastolic (congestive) heart failure: Secondary | ICD-10-CM | POA: Diagnosis present

## 2022-07-04 DIAGNOSIS — J449 Chronic obstructive pulmonary disease, unspecified: Secondary | ICD-10-CM | POA: Diagnosis present

## 2022-07-04 DIAGNOSIS — E1122 Type 2 diabetes mellitus with diabetic chronic kidney disease: Secondary | ICD-10-CM | POA: Diagnosis present

## 2022-07-04 DIAGNOSIS — F17210 Nicotine dependence, cigarettes, uncomplicated: Secondary | ICD-10-CM | POA: Diagnosis present

## 2022-07-04 DIAGNOSIS — F1410 Cocaine abuse, uncomplicated: Secondary | ICD-10-CM | POA: Diagnosis present

## 2022-07-04 DIAGNOSIS — R34 Anuria and oliguria: Secondary | ICD-10-CM | POA: Diagnosis present

## 2022-07-04 DIAGNOSIS — I4892 Unspecified atrial flutter: Secondary | ICD-10-CM | POA: Diagnosis present

## 2022-07-04 DIAGNOSIS — D696 Thrombocytopenia, unspecified: Secondary | ICD-10-CM | POA: Diagnosis present

## 2022-07-04 DIAGNOSIS — E11649 Type 2 diabetes mellitus with hypoglycemia without coma: Secondary | ICD-10-CM | POA: Diagnosis not present

## 2022-07-04 DIAGNOSIS — E872 Acidosis, unspecified: Secondary | ICD-10-CM | POA: Diagnosis present

## 2022-07-04 LAB — CBC
HCT: 33.3 % — ABNORMAL LOW (ref 39.0–52.0)
Hemoglobin: 10.4 g/dL — ABNORMAL LOW (ref 13.0–17.0)
MCH: 29.6 pg (ref 26.0–34.0)
MCHC: 31.2 g/dL (ref 30.0–36.0)
MCV: 94.9 fL (ref 80.0–100.0)
Platelets: 76 10*3/uL — ABNORMAL LOW (ref 150–400)
RBC: 3.51 MIL/uL — ABNORMAL LOW (ref 4.22–5.81)
RDW: 17.8 % — ABNORMAL HIGH (ref 11.5–15.5)
WBC: 4.3 10*3/uL (ref 4.0–10.5)
nRBC: 0 % (ref 0.0–0.2)

## 2022-07-04 LAB — GLUCOSE, CAPILLARY
Glucose-Capillary: 105 mg/dL — ABNORMAL HIGH (ref 70–99)
Glucose-Capillary: 106 mg/dL — ABNORMAL HIGH (ref 70–99)
Glucose-Capillary: 107 mg/dL — ABNORMAL HIGH (ref 70–99)
Glucose-Capillary: 110 mg/dL — ABNORMAL HIGH (ref 70–99)
Glucose-Capillary: 205 mg/dL — ABNORMAL HIGH (ref 70–99)
Glucose-Capillary: 29 mg/dL — CL (ref 70–99)
Glucose-Capillary: 45 mg/dL — ABNORMAL LOW (ref 70–99)
Glucose-Capillary: 55 mg/dL — ABNORMAL LOW (ref 70–99)
Glucose-Capillary: 68 mg/dL — ABNORMAL LOW (ref 70–99)
Glucose-Capillary: 78 mg/dL (ref 70–99)
Glucose-Capillary: 85 mg/dL (ref 70–99)
Glucose-Capillary: 88 mg/dL (ref 70–99)

## 2022-07-04 LAB — COMPREHENSIVE METABOLIC PANEL
ALT: 40 U/L (ref 0–44)
AST: 36 U/L (ref 15–41)
Albumin: 3.2 g/dL — ABNORMAL LOW (ref 3.5–5.0)
Alkaline Phosphatase: 93 U/L (ref 38–126)
Anion gap: 21 — ABNORMAL HIGH (ref 5–15)
BUN: 120 mg/dL — ABNORMAL HIGH (ref 8–23)
CO2: 23 mmol/L (ref 22–32)
Calcium: 8.2 mg/dL — ABNORMAL LOW (ref 8.9–10.3)
Chloride: 96 mmol/L — ABNORMAL LOW (ref 98–111)
Creatinine, Ser: 16.32 mg/dL — ABNORMAL HIGH (ref 0.61–1.24)
GFR, Estimated: 3 mL/min — ABNORMAL LOW (ref >60)
Glucose, Bld: 48 mg/dL — ABNORMAL LOW (ref 70–99)
Potassium: 6.3 mmol/L (ref 3.5–5.1)
Sodium: 140 mmol/L (ref 135–145)
Total Bilirubin: 1.7 mg/dL — ABNORMAL HIGH (ref 0.3–1.2)
Total Protein: 7 g/dL (ref 6.5–8.1)

## 2022-07-04 LAB — HEPATITIS B SURFACE ANTIGEN: Hepatitis B Surface Ag: NONREACTIVE

## 2022-07-04 LAB — BASIC METABOLIC PANEL
Anion gap: 11 (ref 5–15)
BUN: 48 mg/dL — ABNORMAL HIGH (ref 8–23)
CO2: 28 mmol/L (ref 22–32)
Calcium: 9.1 mg/dL (ref 8.9–10.3)
Chloride: 97 mmol/L — ABNORMAL LOW (ref 98–111)
Creatinine, Ser: 8.56 mg/dL — ABNORMAL HIGH (ref 0.61–1.24)
GFR, Estimated: 6 mL/min — ABNORMAL LOW (ref >60)
Glucose, Bld: 107 mg/dL — ABNORMAL HIGH (ref 70–99)
Potassium: 4.8 mmol/L (ref 3.5–5.1)
Sodium: 136 mmol/L (ref 135–145)

## 2022-07-04 LAB — LACTIC ACID, PLASMA: Lactic Acid, Venous: 2.2 mmol/L (ref 0.5–1.9)

## 2022-07-04 LAB — HIV ANTIBODY (ROUTINE TESTING W REFLEX): HIV Screen 4th Generation wRfx: NONREACTIVE

## 2022-07-04 MED ORDER — AMIODARONE HCL 200 MG PO TABS
200.0000 mg | ORAL_TABLET | Freq: Every day | ORAL | Status: DC
  Administered 2022-07-04 – 2022-07-07 (×4): 200 mg via ORAL
  Filled 2022-07-04 (×4): qty 1

## 2022-07-04 MED ORDER — AMLODIPINE BESYLATE 5 MG PO TABS
5.0000 mg | ORAL_TABLET | Freq: Every day | ORAL | Status: DC
  Administered 2022-07-04: 5 mg via ORAL
  Filled 2022-07-04 (×2): qty 1

## 2022-07-04 MED ORDER — SEVELAMER CARBONATE 800 MG PO TABS
4000.0000 mg | ORAL_TABLET | Freq: Three times a day (TID) | ORAL | Status: DC
  Administered 2022-07-04 – 2022-07-08 (×12): 4000 mg via ORAL
  Filled 2022-07-04 (×12): qty 5

## 2022-07-04 MED ORDER — CHLORHEXIDINE GLUCONATE CLOTH 2 % EX PADS: 6.0000 | MEDICATED_PAD | Freq: Every day | CUTANEOUS | Status: DC

## 2022-07-04 MED ORDER — CARVEDILOL 25 MG PO TABS
25.0000 mg | ORAL_TABLET | Freq: Every day | ORAL | Status: DC
  Administered 2022-07-04 – 2022-07-06 (×3): 25 mg via ORAL
  Filled 2022-07-04 (×3): qty 1

## 2022-07-04 MED ORDER — CALCITRIOL 0.5 MCG PO CAPS: 0.5000 ug | ORAL_CAPSULE | ORAL | Status: DC

## 2022-07-04 MED ORDER — VANCOMYCIN HCL 125 MG PO CAPS
125.0000 mg | ORAL_CAPSULE | Freq: Four times a day (QID) | ORAL | Status: DC
  Administered 2022-07-04 – 2022-07-08 (×17): 125 mg via ORAL
  Filled 2022-07-04 (×17): qty 1

## 2022-07-04 MED ORDER — PANTOPRAZOLE SODIUM 40 MG PO TBEC
40.0000 mg | DELAYED_RELEASE_TABLET | Freq: Every day | ORAL | Status: DC
  Administered 2022-07-04 – 2022-07-08 (×5): 40 mg via ORAL
  Filled 2022-07-04 (×5): qty 1

## 2022-07-04 MED ORDER — CARVEDILOL 12.5 MG PO TABS
12.5000 mg | ORAL_TABLET | Freq: Every day | ORAL | Status: DC
  Administered 2022-07-04: 12.5 mg via ORAL
  Filled 2022-07-04 (×3): qty 1

## 2022-07-04 MED ORDER — SERTRALINE HCL 50 MG PO TABS
25.0000 mg | ORAL_TABLET | Freq: Every day | ORAL | Status: DC
  Administered 2022-07-04 – 2022-07-08 (×5): 25 mg via ORAL
  Filled 2022-07-04 (×5): qty 1

## 2022-07-04 MED ORDER — CINACALCET HCL 30 MG PO TABS: 180.0000 mg | ORAL_TABLET | ORAL | Status: DC

## 2022-07-04 NOTE — Plan of Care (Signed)
  Problem: Education: Goal: Knowledge of disease and its progression will improve Outcome: Progressing   Problem: Nutritional: Goal: Ability to make healthy dietary choices will improve Outcome: Progressing   Problem: Clinical Measurements: Goal: Complications related to the disease process, condition or treatment will be avoided or minimized Outcome: Progressing

## 2022-07-04 NOTE — Assessment & Plan Note (Addendum)
Hypoglycemia.   Capillary glucose 110. 85, 78.  Patient at home not on insulin therapy and not on oral hypoglycemic agents.  Poor oral intake over last few days, related to COVID 19 infection.  Likely poor glycogen storage.   Patient has received D50 bolus.  This am he is tolerating po well.  Plan to continue holding insulin coverage, and continue capillary glucose monitoring q 4 hrs.  If recurrent hypoglycemia, will add low rate of Iv dextrose.

## 2022-07-04 NOTE — Consult Note (Signed)
Renal Service Consult Note Raymond [name]  Raymond [name] 07/04/2022 Raymond Blazing, MD Requesting Physician: Dr. Cathlean Sauer  Reason for Consult: ESRD pt w/ missed HD and hyperkalemia HPI: The patient is a 66 y.o. year-old w/ PMH as below who presented to ED w/ gen'd weakness, diarrhea x 2 wks, cough and some SOB. Too weak to get up. Lives in a boarding house. Missed 2 HD sessions. In ED BS 46, K+ 7.5, creat 15, AG 22. EKG showed atrial flutter. WBC 4.9. , Hb 11.2. COVID +. Pt was rx'd w/ IV ins/ glucose, IV Ca gluc, lokelma. We are asked to see for dialysis.   Pt seen in room post HD.  No c/o's, lethargic. No SOB or CP. 2.5 L removed.    ROS - denies CP, no joint pain, no HA, no blurry vision, no rash, no diarrhea, no nausea/ vomiting, no dysuria, no difficulty voiding   Past Medical History  Past Medical History:  Diagnosis Date   Anemia    CAD (coronary artery disease)    a. 03/2018: cath showing 20 to 30% stenosis along the LAD, luminal irregularities along the RCA, and angiographically normal LCx   COPD (chronic obstructive pulmonary disease) (HCC)    ESRD (end stage renal disease) on dialysis ([place])    "MWF; [facility] Rinks" (07/22/17)   Hepatitis C    Still positive s/p liver biopsy at [facility]  and interferon therapy for 6 months. Most recent lab work was on 10/24/12   Hepatitis C    "took the tx; gone now" (12/05/2016)  Was treated   History of blood transfusion ~ 2012/2013   "related to my kidneys; blood was low"   Hypertension    Peripheral arterial disease ([place])    Substance abuse ([place])    Thyroid disease    Type 2 diabetes mellitus with other diabetic kidney complication ([place]) 4/62/7035   Past Surgical History  Past Surgical History:  Procedure Laterality Date   ABDOMINAL AORTOGRAM W/LOWER EXTREMITY N/A 02/08/2018   Procedure: ABDOMINAL AORTOGRAM W/LOWER EXTREMITY;  Surgeon: Conrad Fort Stewart, MD;  Location: [place] [facility];  Service: Cardiovascular;  Laterality:  N/A;   ABDOMINAL AORTOGRAM W/LOWER EXTREMITY Bilateral 06/15/2020   Procedure: ABDOMINAL AORTOGRAM W/LOWER EXTREMITY;  Surgeon: Waynetta Sandy, MD;  Location: [place] [facility];  Service: Cardiovascular;  Laterality: Bilateral;   AV FISTULA PLACEMENT Left Aug. 2013 ?   BIOPSY  01/17/2020   Procedure: BIOPSY;  Surgeon: Jackquline Denmark, MD;  Location: [place];  Service: Endoscopy;;   COLONOSCOPY WITH PROPOFOL N/A 08/21/2018   Procedure: COLONOSCOPY WITH PROPOFOL;  Surgeon: Yetta Flock, MD;  Location: [place];  Service: Gastroenterology;  Laterality: N/A;   ENTEROSCOPY N/A 01/17/2020   Procedure: ENTEROSCOPY;  Surgeon: Jackquline Denmark, MD;  Location: [facility] ENDOSCOPY;  Service: Endoscopy;  Laterality: N/A;   ENTEROSCOPY N/A 07/31/2020   Procedure: ENTEROSCOPY;  Surgeon: Doran Stabler, MD;  Location: [place];  Service: Gastroenterology;  Laterality: N/A;   ENTEROSCOPY N/A 01/27/2021   Procedure: ENTEROSCOPY;  Surgeon: Yetta Flock, MD;  Location: [facility] ENDOSCOPY;  Service: Gastroenterology;  Laterality: N/A;   ESOPHAGOGASTRODUODENOSCOPY (EGD) WITH PROPOFOL N/A 08/20/2018   Procedure: ESOPHAGOGASTRODUODENOSCOPY (EGD) WITH PROPOFOL;  Surgeon: Gatha Mayer, MD;  Location: [place];  Service: Endoscopy;  Laterality: N/A;   FEMORAL-POPLITEAL BYPASS GRAFT Left 04/11/2018   Procedure: BYPASS GRAFT FEMORAL-POPLITEAL ARTERY LEFT LEG;  Surgeon: Rosetta Posner, MD;  Location: [place];  Service: Vascular;  Laterality:  Left;   GIVENS CAPSULE STUDY N/A 07/31/2020   Procedure: GIVENS CAPSULE STUDY;  Surgeon: Doran Stabler, MD;  Location: [place];  Service: Gastroenterology;  Laterality: N/A;   HOT HEMOSTASIS N/A 08/20/2018   Procedure: HOT HEMOSTASIS (ARGON PLASMA COAGULATION/BICAP);  Surgeon: Gatha Mayer, MD;  Location: [facility] ENDOSCOPY;  Service: Endoscopy;  Laterality: N/A;   HOT HEMOSTASIS N/A 01/17/2020   Procedure: HOT HEMOSTASIS (ARGON PLASMA COAGULATION/BICAP);   Surgeon: Jackquline Denmark, MD;  Location: [facility] ENDOSCOPY;  Service: Endoscopy;  Laterality: N/A;   HOT HEMOSTASIS N/A 01/27/2021   Procedure: HOT HEMOSTASIS (ARGON PLASMA COAGULATION/BICAP);  Surgeon: Yetta Flock, MD;  Location: [facility] ENDOSCOPY;  Service: Gastroenterology;  Laterality: N/A;   LEFT HEART CATH AND CORONARY ANGIOGRAPHY N/A 03/29/2018   Procedure: LEFT HEART CATH AND CORONARY ANGIOGRAPHY;  Surgeon: Nelva Bush, MD;  Location: [place] [facility];  Service: Cardiovascular;  Laterality: N/A;   LIVER BIOPSY     ORIF ULNAR FRACTURE Left 05/18/2017   Procedure: OPEN REDUCTION INTERNAL FIXATION (ORIF) ULNAR FRACTURE;  Surgeon: Iran Planas, MD;  Location: [place];  Service: Orthopedics;  Laterality: Left;   PERIPHERAL VASCULAR INTERVENTION Right 06/15/2020   Procedure: PERIPHERAL VASCULAR INTERVENTION;  Surgeon: Waynetta Sandy, MD;  Location: [place] [facility];  Service: Cardiovascular;  Laterality: Right;   POLYPECTOMY  08/21/2018   Procedure: POLYPECTOMY;  Surgeon: Yetta Flock, MD;  Location: [place];  Service: Gastroenterology;;   Earney Mallet N/A 12/11/2012   Procedure: Earney Mallet;  Surgeon: Serafina Mitchell, MD;  Location: [facility] CATH LAB;  Service: Cardiovascular;  Laterality: N/A;   WOUND EXPLORATION Left 06/15/2020   Procedure: GROIN  EXPLORATION;  Surgeon: Waynetta Sandy, MD;  Location: [facility] OR;  Service: Vascular;  Laterality: Left;   Family History  Family History  Problem Relation Age of Onset   Diabetes Father    Hypertension Father    Heart disease Father    Social History  reports that he has been smoking cigarettes. He has a 25.00 pack-year smoking history. He has never used smokeless tobacco. He reports current alcohol use of about 10.0 standard drinks of alcohol per week. He reports that he does not currently use drugs after having used the following drugs: Cocaine. Allergies No Known Allergies Home medications Prior to Admission medications    Medication Sig Start Date End Date Taking? Authorizing Provider  amiodarone (PACERONE) 200 MG tablet Take 200 mg by mouth daily. 06/17/21  Yes [provider]  amLODipine (NORVASC) 5 MG tablet Take 5 mg by mouth daily. 07/22/21  Yes [provider]  Adair Patter 200-25 MCG/ACT AEPB Inhale 2 puffs into the lungs daily as needed (for shortness of breath). 09/17/21  Yes [provider]  calcitRIOL (ROCALTROL) 0.5 MCG capsule Take 1 capsule (0.5 mcg total) by mouth Every Tuesday,Thursday,and Saturday with dialysis. 12/02/21  Yes Mariel Aloe, MD  carvedilol (COREG) 12.5 MG tablet Take 2 tablets (25 mg total) by mouth every morning AND 1 tablet (12.5 mg total) every evening. 12/29/21  Yes Cleaver, Jossie Ng, NP  cinacalcet (SENSIPAR) 30 MG tablet Take 6 tablets (180 mg total) by mouth Every Tuesday,Thursday,and Saturday with dialysis. 12/12/20  Yes Mercy Riding, MD  Darbepoetin Alfa (ARANESP) 200 MCG/0.4ML SOSY injection Inject 0.4 mLs (200 mcg total) into the vein every Tuesday with hemodialysis. 05/25/21  Yes France Ravens, MD  multivitamin (RENA-VIT) TABS tablet Take 1 tablet by mouth at bedtime. 09/17/21  Yes Sheikh, Omair Latif, DO  pantoprazole (  PROTONIX) 40 MG tablet Take 1 tablet (40 mg total) by mouth 2 (two) times daily before a meal. Patient taking differently: Take 40 mg by mouth daily. 09/17/21  Yes Sheikh, Omair Latif, DO  sertraline (ZOLOFT) 25 MG tablet Take 25 mg by mouth daily. 12/07/21  Yes [provider]  sevelamer carbonate (RENVELA) 800 MG tablet Take 5 tablets (4,000 mg total) by mouth 3 (three) times daily with meals. 09/17/21  Yes Sheikh, Omair Latif, DO  albuterol (VENTOLIN HFA) 108 (90 Base) MCG/ACT inhaler Inhale 3 puffs into the lungs daily as needed for shortness of breath or wheezing. 11/06/19   [provider]  apixaban (ELIQUIS) 5 MG TABS tablet Take 1 tablet (5 mg total) by mouth 2 (two) times daily. 06/22/20 08/03/20  Karoline Caldwell,  PA-C     Vitals:   07/04/22 0800 07/04/22 0830 07/04/22 0857 07/04/22 0937  BP: 117/87 112/80 113/83   Pulse: (!) 119 (!) 118 (!) 117   Resp:      Temp:   98.6 F (37 C)   TempSrc:   Oral   SpO2: 100% 100% 100%   Weight:    65.7 kg   Exam Gen alert, no distress No rash, cyanosis or gangrene Sclera anicteric, throat clear  No jvd or bruits Chest clear bilat to bases, no rales/ wheezing RRR no MRG Abd soft ntnd no mass or ascites +bs GU normal male MS no joint effusions or deformity Ext no LE or UE edema, no wounds or ulcers Neuro is alert, Ox 3 , nf    LFA AVF+bruit      Home meds include - norvasc 5, amiodarone, breo ellipta, coreg 25 am and 12.5 pm, sensipar 180 tts, renavite, protonix, zoloft, sevelamer carb 5 ac tid, albuterol prn, prns/ vits/ supps     OP HD: TTS [facility] 4h  400/1.5  68.9kg  2/2 bath  LFA AVF  Hep none - last HD 12/19, post wt 71.1kg  - venofer 50 q wk - rocaltrol 0.75 mcg po tiw - sensipar 60 mg po tiw - mircera 200 q2, last 12/7, due 12/21    CXR - no active disease  Assessment/ Plan: Severe hyperkalemia - K+ 7.3, had HD early this am. Will repeat K+ tomorrow am.  Tachycardia - atrial flutter and/or vol depletion ESRD - on HD TTS. Hx of missed HD. Had HD here early today. Next HD tomorrow.  HTN/ vol - may be dry, is well under dry wt , hx diarrhea x 2 wks. Keep even w/ HD today. Hold orders placed on BP lowering meds.  MBD ckd - CCa in range, add on phos. Cont renvela as binder and po vdra w/ HD.  Anemia esrd - Hb 11.2, due for esa but Hb too high. May come down w/ rehydration. Follow.       Rob Brighten Orndoff  MD 07/04/2022, 4:17 PM Recent Labs  Lab 07/03/22 1919 07/03/22 1930 07/03/22 2021 07/03/22 2022 07/04/22 0052  HGB  --  11.2*   < > 11.6* 10.4*  ALBUMIN  --  3.5  --   --  3.2*  CALCIUM 8.0*  --   --   --  8.2*  CREATININE 15.83*  --   --  17.10* 16.32*  K >7.5*  --    < > 7.3* 6.3*   < > = values in this interval not  displayed.   Inpatient medications:  amiodarone  200 mg Oral Daily   amLODipine  5 mg Oral  Daily   [START ON 07/05/2022] calcitRIOL  0.5 mcg Oral Q T,Th,Sa-HD   carvedilol  12.5 mg Oral Q supper   carvedilol  25 mg Oral Q breakfast   Chlorhexidine Gluconate Cloth  6 each Topical Q0600   [START ON 07/05/2022] cinacalcet  180 mg Oral Q T,Th,Sa-HD   glucose  4 tablet Oral Once   pantoprazole  40 mg Oral Daily   sertraline  25 mg Oral Daily   sevelamer carbonate  4,000 mg Oral TID WC   sodium chloride flush  3 mL Intravenous Q12H   vancomycin  125 mg Oral QID    sodium chloride     sodium chloride, dextrose, sodium chloride flush

## 2022-07-04 NOTE — Progress Notes (Addendum)
Progress Note   Patient: Raymond [name] TWS:[id number] DOB: 1956/03/04 DOA: 07/03/2022     0 DOS: the patient was seen and examined on 07/04/2022   Brief hospital course: Raymond [name] was admitted to the hospital with the working diagnosis of hypoglycemia.   71 yp male with the past medical history of ESRD on HD (T, Th, Sat), paroxysmal atrial fibrillation, hypertension, COPD, heart failure, and peripheral vascular disease who presented with weakness. Reported 2 days of diarrhea, associated with severe generalized weakness, not able to get out of bed. He missed 2 HD sessions this week. EMS was called and he was found hypoglycemic with CBG of 46, he received 15 g of oral glucose and was transported to the ED. On his initial physical examination his blood pressure 151/110, HR 97, RR 17 and 02 saturation 100%, lungs with no wheezing or rales, heart with S1 and S2 present and rhythmic, abdomen with no distention, no lower extremity edema.    Na 140, K >7.5, CL 97, bicarbonate 21, glucose 89, BUN 116, cr 15.8  I cal 0,90  AST 56, ALT 45  Lactic acid 2.7 Wbc 4,9 hgb 11.2 plt 85  Sars COVID 19 positive   Chest radiograph with cardiomegaly with mild hilar vascular congestion, no infiltrates or effusions.   EKG 92 bpm, left axis deviation, right bundle branch block, qtc 482, sinus rhythm with 1st degree AV  block with left atrial enlargement, no significant ST segment or T wave changes.   Patient underwent urgent HD with good toleration.  He developed hypoglycemia and required IV dextrose infusion.   Assessment and Plan: ESRD (end stage renal disease) (HCC) Hyperkalemia, hypocalcemia.   Patient had urgent HD with improvement in his electrolytes.  Follow up renal function with serum K at 6.3, with bicarbonate at 23.  Plan check BMP this morning, if K continue elevated will add sodium zorconium.  Continue inpatient renal replacement therapy per nephrology recommendations.   Metabolic bone disease,  continue with calcitriol, cinacalcet and sevelamer.   Anemia of chronic renal disease, hgb has been stable with no current indications for PRBC transfusion.    Type 2 diabetes mellitus with other diabetic kidney complication (HCC) Hypoglycemia.   Capillary glucose 110. 85, 78.  Patient at home not on insulin therapy and not on oral hypoglycemic agents.  Poor oral intake over last few days, related to COVID 19 infection.  Likely poor glycogen storage.   Patient has received D50 bolus.  This am he is tolerating po well.  Plan to continue holding insulin coverage, and continue capillary glucose monitoring q 4 hrs.  If recurrent hypoglycemia, will add low rate of Iv dextrose.   Hypoglycemia - Secondary to decreased oral intake in the setting of COVID viral infection -advise increase oral intake of food and sweetened beverages overnight -PRN dextrose for CBG <60. Pt is anuric so will not be able to any continuous dextrose overnight  Hypertension Continue blood pressure control with amlodipine and carvedilol.  Continue close blood pressure monitoring.   COVID-19 virus infection No signs of acute viral pneumonia.  Paxlovid contraindicated in ESRD.   Clostridium difficile diarrhea Patient tested positive for C diff. Continue medical therapy with oral vancomycin. Close monitoring for stool output.   Paroxysmal atrial fibrillation (HCC) Continue rate control with amiodarone.  He is not on anticoagulation.  Old records personally reviewed in 06/23 outpatient cardiology listed apixaban on medication list.   Will resume apixaban with close monitoring.   COPD (chronic obstructive  pulmonary disease) ([place]) No signs of exacerbation, continue bronchodilator therapy.   Abnormal LFTs -likely secondary to COVID-viral infection -follow repeat lab tomorrow  Metabolic acidosis -due to worsening renal function from missed dialysis. Follow after dialysis.         Subjective: Patient  had hypoglycemia this am, but improving through the morning, he is tolerating po well.  Physical Exam: Vitals:   07/04/22 0800 07/04/22 0830 07/04/22 0857 07/04/22 0937  BP: 117/87 112/80 113/83   Pulse: (!) 119 (!) 118 (!) 117   Resp:      Temp:   98.6 F (37 C)   TempSrc:   Oral   SpO2: 100% 100% 100%   Weight:    65.7 kg   Neurology awake and alert ENT with mild pallor Cardiovascular with S1 and S2 present and rhythmic with no gallops, rubs or murmurs Respiratory with no rales or wheezing, no rhonchi Abdomen with no distention  No lower extremity edema  Data Reviewed:    Family Communication: no family at the bedside   Disposition: Status is: Observation The patient will require care spanning > 2 midnights and should be moved to inpatient because: glucose monitoring, C diff treatment, K correction   Planned Discharge Destination: Home     Author: Tawni Millers, MD 07/04/2022 11:05 AM  For on call review [email or web address].

## 2022-07-04 NOTE — Progress Notes (Signed)
Patient arrived to unit alert and oriented x4 no pain or shortness of breath. He stated that he was hungry meal was given CBG checked 55 hunger was only symptom Hypoglycemic orders placed. Call bell within reach

## 2022-07-04 NOTE — Hospital Course (Addendum)
[name] is a 66 y.o. male with a history of ESRD on HD, paroxysmal atrial fibrillation/atrial flutter not on anticoagulation, hypertension, chronic systolic heart failure, PAD, COPD, secondary hyperparathyroidism. Patient presented secondary to weakness and found to have hyperkalemia with hypoglycemia. Hypoglycemia managed with dextrose IV fluids and hyperkalemia managed with Lokelma/HD. Prior to discharge, patient developed significant EKG changes in anterolateral distribution. Cardiology consulted and recommended medical management. MRI brain ordered and ruled out stroke. Patient discharged to continue Vancomycin for treatment of C. Difficile.

## 2022-07-04 NOTE — Progress Notes (Addendum)
CBG 68  taken from left finger patient given regular sprite and applesauce will recheck

## 2022-07-04 NOTE — Assessment & Plan Note (Addendum)
Patient tested positive for C diff. Continue medical therapy with oral vancomycin. Close monitoring for stool output.

## 2022-07-04 NOTE — Care Management Obs Status (Signed)
[facility] NOTIFICATION   Patient Details  Name: ELIYAH BAZZI MRN: [id number] Date of Birth: [birth year]   Medicare Observation Status Notification Given:  Yes    Tom-Johnson, Renea Ee, RN 07/04/2022, 10:15 AM

## 2022-07-05 DIAGNOSIS — N186 End stage renal disease: Secondary | ICD-10-CM | POA: Diagnosis not present

## 2022-07-05 DIAGNOSIS — D696 Thrombocytopenia, unspecified: Secondary | ICD-10-CM

## 2022-07-05 DIAGNOSIS — A0472 Enterocolitis due to Clostridium difficile, not specified as recurrent: Secondary | ICD-10-CM

## 2022-07-05 DIAGNOSIS — U071 COVID-19: Secondary | ICD-10-CM | POA: Diagnosis not present

## 2022-07-05 DIAGNOSIS — E1129 Type 2 diabetes mellitus with other diabetic kidney complication: Secondary | ICD-10-CM | POA: Diagnosis not present

## 2022-07-05 DIAGNOSIS — E875 Hyperkalemia: Secondary | ICD-10-CM | POA: Diagnosis not present

## 2022-07-05 LAB — CBC
HCT: 32.1 % — ABNORMAL LOW (ref 39.0–52.0)
Hemoglobin: 10.4 g/dL — ABNORMAL LOW (ref 13.0–17.0)
MCH: 30.5 pg (ref 26.0–34.0)
MCHC: 32.4 g/dL (ref 30.0–36.0)
MCV: 94.1 fL (ref 80.0–100.0)
Platelets: 109 10*3/uL — ABNORMAL LOW (ref 150–400)
RBC: 3.41 MIL/uL — ABNORMAL LOW (ref 4.22–5.81)
RDW: 17.9 % — ABNORMAL HIGH (ref 11.5–15.5)
WBC: 3.7 10*3/uL — ABNORMAL LOW (ref 4.0–10.5)
nRBC: 0 % (ref 0.0–0.2)

## 2022-07-05 LAB — GLUCOSE, CAPILLARY
Glucose-Capillary: 100 mg/dL — ABNORMAL HIGH (ref 70–99)
Glucose-Capillary: 101 mg/dL — ABNORMAL HIGH (ref 70–99)
Glucose-Capillary: 118 mg/dL — ABNORMAL HIGH (ref 70–99)
Glucose-Capillary: 118 mg/dL — ABNORMAL HIGH (ref 70–99)
Glucose-Capillary: 125 mg/dL — ABNORMAL HIGH (ref 70–99)
Glucose-Capillary: 98 mg/dL (ref 70–99)

## 2022-07-05 LAB — PHOSPHORUS: Phosphorus: 6.6 mg/dL — ABNORMAL HIGH (ref 2.5–4.6)

## 2022-07-05 LAB — BASIC METABOLIC PANEL
Anion gap: 14 (ref 5–15)
BUN: 58 mg/dL — ABNORMAL HIGH (ref 8–23)
CO2: 25 mmol/L (ref 22–32)
Calcium: 9.3 mg/dL (ref 8.9–10.3)
Chloride: 98 mmol/L (ref 98–111)
Creatinine, Ser: 10.31 mg/dL — ABNORMAL HIGH (ref 0.61–1.24)
GFR, Estimated: 5 mL/min — ABNORMAL LOW (ref >60)
Glucose, Bld: 95 mg/dL (ref 70–99)
Potassium: 5.1 mmol/L (ref 3.5–5.1)
Sodium: 137 mmol/L (ref 135–145)

## 2022-07-05 MED ORDER — SODIUM CHLORIDE 0.9 % IV BOLUS
1000.0000 mL | Freq: Once | INTRAVENOUS | Status: AC
  Administered 2022-07-05: 1000 mL via INTRAVENOUS

## 2022-07-05 MED ORDER — CINACALCET HCL 30 MG PO TABS
60.0000 mg | ORAL_TABLET | ORAL | Status: DC
  Administered 2022-07-07: 60 mg via ORAL
  Filled 2022-07-05: qty 2

## 2022-07-05 MED ORDER — CALCITRIOL 0.5 MCG PO CAPS
0.7500 ug | ORAL_CAPSULE | ORAL | Status: DC
  Administered 2022-07-07: 0.75 ug via ORAL
  Filled 2022-07-05: qty 1

## 2022-07-05 MED ORDER — RENA-VITE PO TABS
1.0000 | ORAL_TABLET | Freq: Every day | ORAL | Status: DC
  Administered 2022-07-05 – 2022-07-07 (×3): 1 via ORAL
  Filled 2022-07-05 (×3): qty 1

## 2022-07-05 NOTE — Progress Notes (Addendum)
[facility] Progress Note   Subjective:Seen in room, asking if he can go home. Now +  COVID although seems quite comfortable on RA. Denies SOB.  Objective Vitals:   07/04/22 2031 07/05/22 0407 07/05/22 0830 07/05/22 0938  BP: 103/68 96/78 113/85 98/76  Pulse: 94 99 (!) 117 97  Resp: '18 17 18 18  '$ Temp: 98.5 F (36.9 C) 98.1 F (36.7 C) 98.7 F (37.1 C) 98.5 F (36.9 C)  TempSrc: Oral Oral Oral Oral  SpO2: 94% 95% 99% 98%  Weight:       Physical Exam General: chronically ill appearing male in NAD Heart: irreg, irreg. Afib on monitor. No M/R/G Lungs: CTAB Abdomen: Flat, NABS Extremities:No LE edema Dialysis Access: L AVF + T/B   Additional Objective Labs: Basic Metabolic Panel: Recent Labs  Lab 07/04/22 0052 07/04/22 1642 07/05/22 0802 07/05/22 0804  NA 140 136 137  --   K 6.3* 4.8 5.1  --   CL 96* 97* 98  --   CO2 '23 28 25  '$ --   GLUCOSE 48* 107* 95  --   BUN 120* 48* 58*  --   CREATININE 16.32* 8.56* 10.31*  --   CALCIUM 8.2* 9.1 9.3  --   PHOS  --   --   --  6.6*   Liver Function Tests: Recent Labs  Lab 07/03/22 1930 07/04/22 0052  AST 56* 36  ALT 45* 40  ALKPHOS 104 93  BILITOT 1.8* 1.7*  PROT 8.1 7.0  ALBUMIN 3.5 3.2*   No results for input(s): "LIPASE", "AMYLASE" in the last 168 hours. CBC: Recent Labs  Lab 07/03/22 1930 07/03/22 2021 07/03/22 2022 07/04/22 0052  WBC 4.9  --   --  4.3  NEUTROABS 3.8  --   --   --   HGB 11.2* 11.6* 11.6* 10.4*  HCT 35.3* 34.0* 34.0* 33.3*  MCV 93.9  --   --  94.9  PLT 85*  --   --  76*   Blood Culture    Component Value Date/Time   SDES BLOOD RIGHT HAND 11/14/2021 1616   SDES BLOOD RIGHT WRIST 11/14/2021 1616   SPECREQUEST  11/14/2021 1616    BOTTLES DRAWN AEROBIC ONLY Blood Culture results may not be optimal due to an inadequate volume of blood received in culture bottles   SPECREQUEST  11/14/2021 1616    BOTTLES DRAWN AEROBIC AND ANAEROBIC Blood Culture results may not be optimal  due to an inadequate volume of blood received in culture bottles   CULT  11/14/2021 1616    NO GROWTH 5 DAYS Performed at [facility], [place] [address] [place], [place] [address]    CULT  11/14/2021 1616    NO GROWTH 5 DAYS Performed at [facility] [address], [place], [place] [address]    REPTSTATUS 11/19/2021 FINAL 11/14/2021 1616   REPTSTATUS 11/19/2021 FINAL 11/14/2021 1616    Cardiac Enzymes: No results for input(s): "CKTOTAL", "CKMB", "CKMBINDEX", "TROPONINI" in the last 168 hours. CBG: Recent Labs  Lab 07/04/22 1743 07/04/22 2033 07/04/22 2344 07/05/22 0406 07/05/22 0824  GLUCAP 107* 106* 105* 125* 100*   Iron Studies: No results for input(s): "IRON", "TIBC", "TRANSFERRIN", "FERRITIN" in the last 72 hours. '@[email or web address]'$ @ Studies/Results: DG Chest 2 View  Result Date: 07/03/2022 CLINICAL DATA:  Shortness of breath, fatigue. EXAM: CHEST - 2 VIEW COMPARISON:  12/28/2021. FINDINGS: Heart is enlarged and the mediastinal contour is stable. Atherosclerotic calcification of the aorta is noted. Minimal  subsegmental atelectasis is noted at the left lung base. No effusion or pneumothorax. A vascular stent is noted in the left upper extremity. No acute osseous abnormality. IMPRESSION: 1. No active cardiopulmonary disease. 2. Cardiomegaly. Electronically Signed   By: Brett Fairy M.D.   On: 07/03/2022 20:11   Medications:  sodium chloride      amiodarone  200 mg Oral Daily   amLODipine  5 mg Oral Daily   calcitRIOL  0.75 mcg Oral Q T,Th,Sa-HD   carvedilol  12.5 mg Oral Q supper   carvedilol  25 mg Oral Q breakfast   Chlorhexidine Gluconate Cloth  6 each Topical Q0600   Chlorhexidine Gluconate Cloth  6 each Topical Q0600   cinacalcet  60 mg Oral Q T,Th,Sa-HD   glucose  4 tablet Oral Once   pantoprazole  40 mg Oral Daily   sertraline  25 mg Oral Daily   sevelamer carbonate  4,000 mg Oral TID WC   sodium chloride flush  3 mL Intravenous Q12H   vancomycin  125  mg Oral QID     OP HD: TTS [facility] 4h  400/1.5  68.9kg  2/2 bath  LFA AVF  Hep none - last HD 12/19, post wt 71.1kg  - venofer 50  IVq wk - rocaltrol 0.75 mcg po tiw - sensipar 60 mg po tiw - mircera 200 MCG IV q2, last 12/7, due 12/21      CXR - no active disease   Assessment/ Plan: Severe hyperkalemia in setting of missed HD - K+ 7.3 on admission, HD 07/04/2022. K+ 5.1 today.   + C diff-oral vanc per primary  + COVID-19 per primary PAF-rate controlled. Continue carvedilol. Not on AC.  ESRD - on HD TTS. Hx of missed HD. Next HD 07/05/2022.  HTN/ vol - Under EDW with soft BP. Hold Amlodipine, continue carvedilol for rate control. Lower EDW on discharge. Run even in HD today.  MBD ckd - CCa in range, add on phos. Cont renvela as binder and po vdra w/ HD.  Anemia esrd - Hb 11.2, due for esa but Hb too high. May come down w/ rehydration. Follow.   Kendon Sedeno H. Babak Lucus NP-C 07/05/2022, 11:41 AM  [employer] [phone]

## 2022-07-05 NOTE — Progress Notes (Signed)
   07/05/22 1813  Vitals  Temp 98.7 F (37.1 C)  Temp Source Oral  BP 113/73  MAP (mmHg) 86  BP Location Right Arm  BP Method Automatic  Patient Position (if appropriate) Lying  Pulse Rate 77  Pulse Rate Source Monitor  ECG Heart Rate 81  Resp 13  Oxygen Therapy  SpO2 99 %  O2 Device Room Air  Pulse Oximetry Type Continuous   Received patient in bed to unit.  Alert and oriented.  Informed consent signed and in chart.   Treatment initiated: 1422 Treatment completed: 1739  Patient tolerated well.  Transported back to the room  Alert, without acute distress.  Hand-off given to patient's nurse.   Access used: AVF Access issues: some low venous pressure alarms, enough to extend tx by 17 min  Total UF removed: 2525m Medication(s) given: NA Post HD VS: see above Post HD weight: 67.8kg  [facility]

## 2022-07-05 NOTE — Progress Notes (Addendum)
PROGRESS NOTE  Raymond TEBEAU GNF:[id number] DOB: 04/12/1956   PCP: [name], FNP  Patient is from: [facility] (al[facility] rehab)  DOA: 07/03/2022 LOS: 1  Chief complaints Chief Complaint  Patient presents with   Extremity Weakness   Hypoglycemia     Brief Narrative / Interim history:  66 y.o. M with PMH of ESRD on HD TTS, paroxysmal A-fib not on AC, HTN, systolic CHF, PAD, COPD and  secondary hyperparathyroidism who presents with weakness, cough, increased shortness of breath, poor p.o. intake and diarrhea. He lives in a boarding house and one of his roommates was sick.  Patient missed 2 dialysis sessions.  On presentation, hyperkalemic to 7.5 and required emergent dialysis.  COVID-19 PCR positive but no oxygen requirement or respiratory distress.  He also tested positive for C. difficile and started on p.o. vancomycin.  Patient will be going for his HD on schedule today.  Subjective: Seen and examined earlier this morning.  No major events overnight of this morning.  Continues to endorse poor appetite.  He states "food does not taste right".  He also reports insomnia.  He denies chest pain, shortness of breath, cough, nausea, vomiting, abdominal pain or diarrhea.  Objective: Vitals:   07/04/22 2031 07/05/22 0407 07/05/22 0830 07/05/22 0938  BP: 103/68 96/78 113/85 98/76  Pulse: 94 99 (!) 117 97  Resp: 18 17 18 18   Temp: 98.5 F (36.9 C) 98.1 F (36.7 C) 98.7 F (37.1 C) 98.5 F (36.9 C)  TempSrc: Oral Oral Oral Oral  SpO2: 94% 95% 99% 98%  Weight:        Examination:  GENERAL: No apparent distress.  Nontoxic. HEENT: MMM.  Vision and hearing grossly intact.  NECK: Supple.  No apparent JVD.  RESP:  No IWOB.  Fair aeration bilaterally. CVS:  RRR. Heart sounds normal.  ABD/GI/GU: BS+. Abd soft, NTND.  MSK/EXT:  Moves extremities. No apparent deformity. No edema.  SKIN: no apparent skin lesion or wound NEURO: Awake, alert and oriented appropriately.  No  apparent focal neuro deficit. PSYCH: Calm. Normal affect.   Microbiology summarized: COVID-19 PCR positive C. difficile positive  Assessment and plan: Principal Problem:   Hyperkalemia Active Problems:   ESRD (end stage renal disease) (HCC)   Type 2 diabetes mellitus with other diabetic kidney complication (HCC)   Hypertension   COVID-19 virus infection   Clostridium difficile diarrhea   Paroxysmal atrial fibrillation (HCC)   COPD (chronic obstructive pulmonary disease) (HCC)   Thrombocytopenia, unspecified (HCC)  Hyperkalemia: K was 7.5 without significant EKG changes on presentation.  Resolved after emergent HD. 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  Missed 2 HD sessions due to illness.  Had emergent HD on 12/25. -Per nephrology. -Plan for HD on schedule today   Chronic COPD: Stable. -Continue home bronchodilator pending med rec.   Paroxysmal atrial fibrillation: Rate controlled.  Not on AC. -Continue home amiodarone and Coreg.   Hypertension: Normotensive. -Continue home Coreg   Clostridium difficile diarrhea: Presents with diarrhea for 2 weeks.  C. difficile positive.  Diarrhea seems to have resolved. -Continue p.o. vancomycin for 10 days   Abnormal LFTs: Likely due to COVID-19 infection. -Repeat labs in the morning   Metabolic acidosis -due to worsening renal function from missed dialysis. Follow after dialysis.    COVID-19 virus infection: Seems he was symptomatic for 5 days prior to presentation.  No respiratory distress or hypoxia -Outside  of window for antivirals -Continue isolation precaution   Hypoglycemia: In the setting of poor p.o. intake.  Resolved. -Monitor  Generalized weakness/physical deconditioning: Multifactorial.  Patient is from boarding house. -PT/OT eval   Thrombocytopenia: Improving. Recent Labs  Lab 07/03/22 1930 07/04/22 0052 07/05/22 0804  PLT 85* 76* 109*  -Monitor  Decreased  oral intake: Body mass index is 22.02 kg/m. -Consult dietitian. -Start Rena-Vite         DVT prophylaxis:  Heparin with HD?  Code Status: Full code Family Communication: None at bedside Level of care: Telemetry Medical Status is: Inpatient Remains inpatient appropriate because: ESRD requiring HD, CAD infection and generalized weakness   Final disposition: TBD Consultants:  Nephrology  Sch Meds:  Scheduled Meds:  amiodarone  200 mg Oral Daily   calcitRIOL  0.75 mcg Oral Q T,Th,Sa-HD   carvedilol  12.5 mg Oral Q supper   carvedilol  25 mg Oral Q breakfast   cinacalcet  60 mg Oral Q T,Th,Sa-HD   glucose  4 tablet Oral Once   multivitamin  1 tablet Oral QHS   pantoprazole  40 mg Oral Daily   sertraline  25 mg Oral Daily   sevelamer carbonate  4,000 mg Oral TID WC   sodium chloride flush  3 mL Intravenous Q12H   vancomycin  125 mg Oral QID   Continuous Infusions:  sodium chloride     PRN Meds:.sodium chloride, dextrose, sodium chloride flush  Antimicrobials: Anti-infectives (From admission, onward)    Start     Dose/Rate Route Frequency Ordered Stop   07/04/22 0115  vancomycin (VANCOCIN) capsule 125 mg        125 mg Oral 4 times daily 07/04/22 0109 07/13/22 2159        I have personally reviewed the following labs and images: CBC: Recent Labs  Lab 07/03/22 1930 07/03/22 2021 07/03/22 2022 07/04/22 0052 07/05/22 0804  WBC 4.9  --   --  4.3 3.7*  NEUTROABS 3.8  --   --   --   --   HGB 11.2* 11.6* 11.6* 10.4* 10.4*  HCT 35.3* 34.0* 34.0* 33.3* 32.1*  MCV 93.9  --   --  94.9 94.1  PLT 85*  --   --  76* 109*   BMP &GFR Recent Labs  Lab 07/03/22 1919 07/03/22 2021 07/03/22 2022 07/04/22 0052 07/04/22 1642 07/05/22 0802 07/05/22 0804  NA 140 137 135 140 136 137  --   K >7.5* 7.3* 7.3* 6.3* 4.8 5.1  --   CL 97*  --  103 96* 97* 98  --   CO2 21*  --   --  23 28 25   --   GLUCOSE 89  --  88 48* 107* 95  --   BUN 116*  --  118* 120* 48* 58*  --    CREATININE 15.83*  --  17.10* 16.32* 8.56* 10.31*  --   CALCIUM 8.0*  --   --  8.2* 9.1 9.3  --   PHOS  --   --   --   --   --   --  6.6*   CrCl cannot be calculated (Unknown ideal weight.). Liver & Pancreas: Recent Labs  Lab 07/03/22 1930 07/04/22 0052  AST 56* 36  ALT 45* 40  ALKPHOS 104 93  BILITOT 1.8* 1.7*  PROT 8.1 7.0  ALBUMIN 3.5 3.2*   No results for input(s): "LIPASE", "AMYLASE" in the last 168 hours. No results for input(s): "AMMONIA" in the last  168 hours. Diabetic: No results for input(s): "HGBA1C" in the last 72 hours. Recent Labs  Lab 07/04/22 2033 07/04/22 2344 07/05/22 0406 07/05/22 0824 07/05/22 1243  GLUCAP 106* 105* 125* 100* 118*   Cardiac Enzymes: No results for input(s): "CKTOTAL", "CKMB", "CKMBINDEX", "TROPONINI" in the last 168 hours. No results for input(s): "PROBNP" in the last 8760 hours. Coagulation Profile: No results for input(s): "INR", "PROTIME" in the last 168 hours. Thyroid Function Tests: No results for input(s): "TSH", "T4TOTAL", "FREET4", "T3FREE", "THYROIDAB" in the last 72 hours. Lipid Profile: No results for input(s): "CHOL", "HDL", "LDLCALC", "TRIG", "CHOLHDL", "LDLDIRECT" in the last 72 hours. Anemia Panel: No results for input(s): "VITAMINB12", "FOLATE", "FERRITIN", "TIBC", "IRON", "RETICCTPCT" in the last 72 hours. Urine analysis:    Component Value Date/Time   COLORURINE YELLOW 05/08/2010 1335   APPEARANCEUR CLEAR 05/08/2010 1335   LABSPEC 1.009 05/08/2010 1335   PHURINE 6.0 05/08/2010 1335   GLUCOSEU NEGATIVE 05/08/2010 1335   HGBUR NEGATIVE 05/08/2010 1335   BILIRUBINUR NEGATIVE 05/08/2010 1335   KETONESUR NEGATIVE 05/08/2010 1335   PROTEINUR 100 (A) 05/08/2010 1335   UROBILINOGEN 0.2 05/08/2010 1335   NITRITE NEGATIVE 05/08/2010 1335   LEUKOCYTESUR SMALL (A) 05/08/2010 1335   Sepsis Labs: Invalid input(s): "PROCALCITONIN", "LACTICIDVEN"  Microbiology: Recent Results (from the past 240 hour(s))  Resp panel  by RT-PCR (RSV, Flu A&B, Covid) Anterior Nasal Swab     Status: Abnormal   Collection Time: 07/03/22  8:16 PM   Specimen: Anterior Nasal Swab  Result Value Ref Range Status   SARS Coronavirus 2 by RT PCR POSITIVE (A) NEGATIVE Final    Comment: (NOTE) SARS-CoV-2 target nucleic acids are DETECTED.  The SARS-CoV-2 RNA is generally detectable in upper respiratory specimens during the acute phase of infection. Positive results are indicative of the presence of the identified virus, but do not rule out bacterial infection or co-infection with other pathogens not detected by the test. Clinical correlation with patient history and other diagnostic information is necessary to determine patient infection status. The expected result is Negative.  Fact Sheet for Patients: BloggerCourse.com  Fact Sheet for Healthcare Providers: SeriousBroker.it  This test is not yet approved or cleared by the [place] FDA and  has been authorized for detection and/or diagnosis of SARS-CoV-2 by FDA under an Emergency Use Authorization (EUA).  This EUA will remain in effect (meaning this test can be used) for the duration of  the COVID-19 declaration under Section 564(b)(1) of the A ct, [name] section 360bbb-3(b)(1), unless the authorization is terminated or revoked sooner.     Influenza A by PCR NEGATIVE NEGATIVE Final   Influenza B by PCR NEGATIVE NEGATIVE Final    Comment: (NOTE) The Xpert Xpress SARS-CoV-2/FLU/RSV plus assay is intended as an aid in the diagnosis of influenza from Nasopharyngeal swab specimens and should not be used as a sole basis for treatment. Nasal washings and aspirates are unacceptable for Xpert Xpress SARS-CoV-2/FLU/RSV testing.  Fact Sheet for Patients: BloggerCourse.com  Fact Sheet for Healthcare Providers: SeriousBroker.it  This test is not yet approved or cleared by  the [place] FDA and has been authorized for detection and/or diagnosis of SARS-CoV-2 by FDA under an Emergency Use Authorization (EUA). This EUA will remain in effect (meaning this test can be used) for the duration of the COVID-19 declaration under Section 564(b)(1) of the Act, [name] section 360bbb-3(b)(1), unless the authorization is terminated or revoked.     Resp Syncytial Virus by PCR NEGATIVE NEGATIVE Final  Comment: (NOTE) Fact Sheet for Patients: BloggerCourse.com  Fact Sheet for Healthcare Providers: SeriousBroker.it  This test is not yet approved or cleared by the [place] FDA and has been authorized for detection and/or diagnosis of SARS-CoV-2 by FDA under an Emergency Use Authorization (EUA). This EUA will remain in effect (meaning this test can be used) for the duration of the COVID-19 declaration under Section 564(b)(1) of the Act, [name] section 360bbb-3(b)(1), unless the authorization is terminated or revoked.  Performed at [facility], [address], [place], [place] [address]   C Difficile Quick Screen w PCR reflex     Status: Abnormal   Collection Time: 07/03/22 10:27 PM   Specimen: STOOL  Result Value Ref Range Status   C Diff antigen POSITIVE (A) NEGATIVE Final   C Diff toxin NEGATIVE NEGATIVE Final   C Diff interpretation Results are indeterminate. See PCR results.  Final    Comment: Performed at [facility], [address], [place], [place] [address]  C. Diff by PCR, Reflexed     Status: Abnormal   Collection Time: 07/03/22 10:27 PM  Result Value Ref Range Status   Toxigenic C. Difficile by PCR POSITIVE (A) NEGATIVE Final    Comment: Positive for toxigenic C. difficile with little to no toxin production. Only treat if clinical presentation suggests symptomatic illness. Performed at [facility], [address] [place], [place] [address]     Radiology  Studies: No results found.    Eveleen Mcnear T. Jamoni Hewes [facility]  If 7PM-7AM, please contact night-coverage [email or web address] 07/05/2022, 2:29 PM

## 2022-07-05 NOTE — Progress Notes (Signed)
Pt receives out-pt HD at [facility]) on TTS. Pt has an 11:50 chair time. Clinic advised pt is positive for covid and cdiff on 12/24. Pt's chair time will remain the same at d/c per clinic manager. Will assist as needed.   Melven Sartorius Renal Navigator [phone]

## 2022-07-06 DIAGNOSIS — E875 Hyperkalemia: Secondary | ICD-10-CM | POA: Diagnosis not present

## 2022-07-06 DIAGNOSIS — I480 Paroxysmal atrial fibrillation: Secondary | ICD-10-CM | POA: Diagnosis not present

## 2022-07-06 DIAGNOSIS — A0472 Enterocolitis due to Clostridium difficile, not specified as recurrent: Secondary | ICD-10-CM | POA: Diagnosis not present

## 2022-07-06 DIAGNOSIS — N186 End stage renal disease: Secondary | ICD-10-CM | POA: Diagnosis not present

## 2022-07-06 LAB — GLUCOSE, CAPILLARY
Glucose-Capillary: 104 mg/dL — ABNORMAL HIGH (ref 70–99)
Glucose-Capillary: 108 mg/dL — ABNORMAL HIGH (ref 70–99)
Glucose-Capillary: 86 mg/dL (ref 70–99)
Glucose-Capillary: 101 mg/dL — ABNORMAL HIGH (ref 70–99)
Glucose-Capillary: 99 mg/dL (ref 70–99)

## 2022-07-06 MED ORDER — CHLORHEXIDINE GLUCONATE CLOTH 2 % EX PADS
6.0000 | MEDICATED_PAD | Freq: Every day | CUTANEOUS | Status: DC
  Administered 2022-07-08: 6 via TOPICAL

## 2022-07-06 NOTE — Progress Notes (Signed)
[facility]                                                                               Raymond [name], is a 66 y.o. male, DOB - Aug 07, 1955, NOM:[id number] Admit date - 07/03/2022    Outpatient Primary MD for the patient is [name]., FNP  LOS - 2  days    Brief summary     66 y/o male with the past medical history of ESRD on HD (T, Th, Sat), paroxysmal atrial fibrillation, hypertension, COPD, heart failure, and peripheral vascular disease who presented with weakness. Reported 2 days of diarrhea, associated with severe generalized weakness, not able to get out of bed. He missed 2 HD sessions this week. EMS was called and he was found hypoglycemic with CBG of 46, he received 15 g of oral glucose and was transported to the ED. On presentation, patient was hyperkalemic to 7.5 and required emergent dialysis. COVID-19 PCR positive but no oxygen requirement or respiratory distress. He also tested positive for C. difficile and started on p.o. vancomycin. Patient's diarrhea is improving but not resolved yet.     Assessment & Plan    Assessment and Plan: ESRD (end stage renal disease) (HCC) Hyperkalemia, hypocalcemia.   Hyperkalemia resolved.  HD as per nephrology.   Type 2 diabetes mellitus with other diabetic kidney complication ([place]) Uncontrolled with hypoglycemia CBG (last 3)  Recent Labs    07/06/22 1130 07/06/22 1642 07/06/22 2030  GLUCAP 86 101* 99   Resolved.   Hypertension BP parameters are optimal. Continue with coreg.   COVID-19 virus infection No signs of acute viral pneumonia.  Paxlovid contraindicated in ESRD. Asymptomatic.    Clostridium difficile diarrhea Patient tested positive for C diff. Continue medical therapy with oral vancomycin. Diarrhea improving.   Paroxysmal atrial fibrillation (HCC) Rate control with amiodarone and coreg and not on anti coagulation of unclear reasons.   COPD (chronic obstructive pulmonary disease)  (HCC) No signs of exacerbation.  Abnormal LFTs -likely secondary to COVID-viral infection  Recheck liver panel in 2 to 4 weeks.    Metabolic acidosis Resolved.    Anemia of chronic disease:  Hemoglobin stable around 10.  Continue to monitor.     Thrombocytopenia:  Improving, ? Covid infection.  Continue to monitor.    Estimated body mass index is 22.73 kg/m as calculated from the following:   Height as of 12/29/21: '5\' 8"'$  (1.727 m).   Weight as of this encounter: 67.8 kg.  Code Status: full code.  DVT Prophylaxis:  scd's no heparin due to thrombocytopenia. As platelets are improving recommend restarting heparin for prophylaxis.    Level of Care: Level of care: Telemetry Medical Family Communication: none at bedside.   Disposition Plan:     Remains inpatient appropriate:  discharge when diarrhea improves.   Procedures:  None.   Consultants:   Nephrology.   Antimicrobials:   Anti-infectives (From admission, onward)    Start     Dose/Rate Route Frequency Ordered Stop   07/04/22 0115  vancomycin (VANCOCIN) capsule 125 mg        125 mg Oral 4 times daily 07/04/22 0109 07/13/22 2159  Medications  Scheduled Meds:  amiodarone  200 mg Oral Daily   calcitRIOL  0.75 mcg Oral Q T,Th,Sa-HD   carvedilol  12.5 mg Oral Q supper   carvedilol  25 mg Oral Q breakfast   [START ON 07/07/2022] Chlorhexidine Gluconate Cloth  6 each Topical Q0600   cinacalcet  60 mg Oral Q T,Th,Sa-HD   glucose  4 tablet Oral Once   multivitamin  1 tablet Oral QHS   pantoprazole  40 mg Oral Daily   sertraline  25 mg Oral Daily   sevelamer carbonate  4,000 mg Oral TID WC   sodium chloride flush  3 mL Intravenous Q12H   vancomycin  125 mg Oral QID   Continuous Infusions:  sodium chloride     PRN Meds:.sodium chloride, dextrose, sodium chloride flush    Subjective:   [place] was seen and examined today.  Still watery diarrhea 2 episodes.   Objective:   Vitals:    07/05/22 2026 07/06/22 0345 07/06/22 0812 07/06/22 1644  BP: (!) 137/96 118/72 (!) 120/107 112/73  Pulse: 78 79 79 77  Resp: '18 18 18 18  '$ Temp: 97.7 F (36.5 C) 97.9 F (36.6 C) 98.3 F (36.8 C) 98.3 F (36.8 C)  TempSrc: Oral Oral Oral Oral  SpO2: 100% 100% 94% 98%  Weight:        Intake/Output Summary (Last 24 hours) at 07/06/2022 1803 Last data filed at 07/06/2022 1700 Gross per 24 hour  Intake 1843 ml  Output 0 ml  Net 1843 ml   Filed Weights   07/04/22 0937 07/05/22 1357 07/05/22 1813  Weight: 65.7 kg 68.3 kg 67.8 kg     Exam General exam: Appears calm and comfortable  Respiratory system: Clear to auscultation. Respiratory effort normal. Cardiovascular system: S1 & S2 heard, RRR. No JVD,  Gastrointestinal system: Abdomen is nondistended, soft and nontender.  Central nervous system: Alert and oriented. No focal neurological deficits. Extremities: Symmetric 5 x 5 power. Skin: No rashes, l Psychiatry: Mood & affect appropriate.    Data Reviewed:  I have personally reviewed following labs and imaging studies   CBC Lab Results  Component Value Date   WBC 3.7 (L) 07/05/2022   RBC 3.41 (L) 07/05/2022   HGB 10.4 (L) 07/05/2022   HCT 32.1 (L) 07/05/2022   MCV 94.1 07/05/2022   MCH 30.5 07/05/2022   PLT 109 (L) 07/05/2022   MCHC 32.4 07/05/2022   RDW 17.9 (H) 07/05/2022   LYMPHSABS 0.7 07/03/2022   MONOABS 0.5 07/03/2022   EOSABS 0.0 07/03/2022   BASOSABS 0.0 09/73/5329     Last metabolic panel Lab Results  Component Value Date   NA 137 07/05/2022   K 5.1 07/05/2022   CL 98 07/05/2022   CO2 25 07/05/2022   BUN 58 (H) 07/05/2022   CREATININE 10.31 (H) 07/05/2022   GLUCOSE 95 07/05/2022   GFRNONAA 5 (L) 07/05/2022   GFRAA 6 (L) 01/18/2020   CALCIUM 9.3 07/05/2022   PHOS 6.6 (H) 07/05/2022   PROT 7.0 07/04/2022   ALBUMIN 3.2 (L) 07/04/2022   LABGLOB 3.9 05/15/2018   AGRATIO 0.9 (L) 05/15/2018   BILITOT 1.7 (H) 07/04/2022   ALKPHOS 93 07/04/2022    AST 36 07/04/2022   ALT 40 07/04/2022   ANIONGAP 14 07/05/2022    CBG (last 3)  Recent Labs    07/06/22 0727 07/06/22 1130 07/06/22 1642  GLUCAP 108* 86 101*      Coagulation Profile: No results for input(s): "INR", "PROTIME"  in the last 168 hours.   Radiology Studies: No results found.     Hosie Poisson M.D. [facility] 07/06/2022, 6:03 PM  Available via Epic secure chat 7am-7pm After 7 pm, please refer to night coverage provider listed on amion.

## 2022-07-06 NOTE — Evaluation (Signed)
Occupational Therapy Evaluation Patient Details Name: Raymond [name] MRN: [id number] DOB: 04-16-1956 Today's Date: 07/06/2022   History of Present Illness Pt is a 66 y/o M presenting to ED on 12/24 with flu like symptoms x3 days and hypoglycemia. Found to have COVID-19 and C-diff, admitted for hyperkalemia. PMH includes ESRD on HD TTS, aparoxysmal A fib not on AC, HTN, HFrEF, PAD, COPD.   Clinical Impression   Pt reports ind at baseline with ADLs, uses transportation system for IADLs, and reports use of cane at baseline. Pt currently needing min-mod A for ADLs, min guard for bed mobility and transfers with RW. Pt with mild unsteadiness and needing frequent directional cues due to poor vision (cataracts). Pt presenting with impairments listed below, will follow acutely. Recommend HHOT at d/c.     Recommendations for follow up therapy are one component of a multi-disciplinary discharge planning process, led by the attending physician.  Recommendations may be updated based on patient status, additional functional criteria and insurance authorization.   Follow Up Recommendations  Home health OT     Assistance Recommended at Discharge Intermittent Supervision/Assistance  Patient can return home with the following A little help with walking and/or transfers;A little help with bathing/dressing/bathroom;Assistance with cooking/housework;Direct supervision/assist for medications management;Direct supervision/assist for financial management;Assist for transportation;Help with stairs or ramp for entrance    Functional Status Assessment  Patient has had a recent decline in their functional status and demonstrates the ability to make significant improvements in function in a reasonable and predictable amount of time.  Equipment Recommendations  Other (comment) (RW)    Recommendations for Other Services PT consult     Precautions / Restrictions Precautions Precautions: Fall Restrictions Weight  Bearing Restrictions: No      Mobility Bed Mobility Overal bed mobility: Needs Assistance Bed Mobility: Supine to Sit     Supine to sit: Min guard          Transfers Overall transfer level: Needs assistance Equipment used: Rolling walker (2 wheels) Transfers: Sit to/from Stand Sit to Stand: Min guard           General transfer comment: min guard A, able to walk around bed in room to chair      Balance Overall balance assessment: Needs assistance Sitting-balance support: Feet supported Sitting balance-Leahy Scale: Good Sitting balance - Comments: can reach down to pull up socks without LOB   Standing balance support: During functional activity Standing balance-Leahy Scale: Fair Standing balance comment: reliant on external support                           ADL either performed or assessed with clinical judgement   ADL Overall ADL's : Needs assistance/impaired Eating/Feeding: Supervision/ safety   Grooming: Minimal assistance   Upper Body Bathing: Minimal assistance   Lower Body Bathing: Moderate assistance   Upper Body Dressing : Minimal assistance   Lower Body Dressing: Moderate assistance   Toilet Transfer: Min guard;Rolling walker (2 wheels);Ambulation;Regular Toilet           Functional mobility during ADLs: Min guard;Rolling walker (2 wheels)       Vision Baseline Vision/History: 4 Cataracts Additional Comments: reports having cataract surgery soon     Perception Perception Perception Tested?: No   Praxis Praxis Praxis tested?: Not tested    Pertinent Vitals/Pain Pain Assessment Pain Assessment: No/denies pain     Hand Dominance     Extremity/Trunk Assessment Upper Extremity Assessment Upper Extremity Assessment:  Generalized weakness   Lower Extremity Assessment Lower Extremity Assessment: Defer to PT evaluation   Cervical / Trunk Assessment Cervical / Trunk Assessment: Normal   Communication  Communication Communication: No difficulties;HOH   Cognition Arousal/Alertness: Awake/alert Behavior During Therapy: WFL for tasks assessed/performed Overall Cognitive Status: Within Functional Limits for tasks assessed                                 General Comments: A & O x4     General Comments  VSS on RA    Exercises     Shoulder Instructions      Home Living Family/patient expects to be discharged to:: Private residence Living Arrangements: Alone   Type of Home: Apartment Home Access: Stairs to enter [employer] of Steps: 4   Home Layout: One level               Home Equipment: Cane - single point;Shower Counsellor (2 wheels)   Additional Comments: unsure if he has RW      Prior Functioning/Environment Prior Level of Function : Independent/Modified Independent             Mobility Comments: reports use of cane sometimes ADLs Comments: uses big wheel transporation for HD services and getting out in community        OT Problem List: Decreased strength;Decreased range of motion;Decreased activity tolerance;Impaired balance (sitting and/or standing);Decreased cognition;Decreased safety awareness      OT Treatment/Interventions: Self-care/ADL training;Therapeutic exercise;Energy conservation;DME and/or AE instruction;Therapeutic activities;Patient/family education;Balance training    OT Goals(Current goals can be found in the care plan section) Acute Rehab OT Goals Patient Stated Goal: none stated OT Goal Formulation: With patient Time For Goal Achievement: 07/20/22 Potential to Achieve Goals: Good ADL Goals Pt Will Perform Upper Body Dressing: with modified independence;sitting Pt Will Perform Lower Body Dressing: with modified independence;sitting/lateral leans;sit to/from stand Pt Will Transfer to Toilet: with modified independence;regular height toilet;ambulating  OT Frequency: Min 2X/week    Co-evaluation               AM-PAC OT "6 Clicks" Daily Activity     Outcome Measure Help from another person eating meals?: None Help from another person taking care of personal grooming?: A Little Help from another person toileting, which includes using toliet, bedpan, or urinal?: A Little Help from another person bathing (including washing, rinsing, drying)?: A Lot Help from another person to put on and taking off regular upper body clothing?: A Little Help from another person to put on and taking off regular lower body clothing?: A Lot 6 Click Score: 17   End of Session Equipment Utilized During Treatment: Rolling walker (2 wheels) Nurse Communication: Mobility status  Activity Tolerance: Patient tolerated treatment well Patient left: in chair;with call bell/phone within reach;with chair alarm set  OT Visit Diagnosis: Unsteadiness on feet (R26.81);Other abnormalities of gait and mobility (R26.89);Muscle weakness (generalized) (M62.81)                Time: 1610-9604 OT Time Calculation (min): 22 min Charges:  OT General Charges $OT Visit: 1 Visit OT Evaluation $OT Eval Moderate Complexity: 1 Mod  Enma Maeda K, OTD, OTR/L [employer] [facility]erred Acute Rehab [phone]  Carver Fila Koonce 07/06/2022, 11:34 AM

## 2022-07-06 NOTE — Progress Notes (Signed)
Transition of Care Department [facility]) following patient for high risk of readmission.   Patient lives in boarding house. Patient positive for Covid and missed 2 dialysis sessions.   Transition of Care Department [facility]) has reviewed patient and we will continue to monitor patient advancement through interdisciplinary progression rounds.

## 2022-07-06 NOTE — Progress Notes (Signed)
[facility] Progress Note   Subjective: COVID +. No issues reported overnight. HD 12/26 Net UF 2.5 liters.  Objective Vitals:   07/05/22 1813 07/05/22 2026 07/06/22 0345 07/06/22 0812  BP: 113/73 (!) 137/96 118/72 (!) 120/107  Pulse: 77 78 79 79  Resp: '13 18 18 18  '$ Temp: 98.7 F (37.1 C) 97.7 F (36.5 C) 97.9 F (36.6 C) 98.3 F (36.8 C)  TempSrc: Oral Oral Oral Oral  SpO2: 99% 100% 100% 94%  Weight: 67.8 kg      Physical Exam General: chronically ill appearing male in NAD Heart: irreg, irreg. Afib on monitor. No M/R/G Lungs: CTAB Abdomen: Flat, NABS Extremities:No LE edema Dialysis Access: L AVF + T/B    Additional Objective Labs: Basic Metabolic Panel: Recent Labs  Lab 07/04/22 0052 07/04/22 1642 07/05/22 0802 07/05/22 0804  NA 140 136 137  --   K 6.3* 4.8 5.1  --   CL 96* 97* 98  --   CO2 '23 28 25  '$ --   GLUCOSE 48* 107* 95  --   BUN 120* 48* 58*  --   CREATININE 16.32* 8.56* 10.31*  --   CALCIUM 8.2* 9.1 9.3  --   PHOS  --   --   --  6.6*   Liver Function Tests: Recent Labs  Lab 07/03/22 1930 07/04/22 0052  AST 56* 36  ALT 45* 40  ALKPHOS 104 93  BILITOT 1.8* 1.7*  PROT 8.1 7.0  ALBUMIN 3.5 3.2*   No results for input(s): "LIPASE", "AMYLASE" in the last 168 hours. CBC: Recent Labs  Lab 07/03/22 1930 07/03/22 2021 07/03/22 2022 07/04/22 0052 07/05/22 0804  WBC 4.9  --   --  4.3 3.7*  NEUTROABS 3.8  --   --   --   --   HGB 11.2*   < > 11.6* 10.4* 10.4*  HCT 35.3*   < > 34.0* 33.3* 32.1*  MCV 93.9  --   --  94.9 94.1  PLT 85*  --   --  76* 109*   < > = values in this interval not displayed.   Blood Culture    Component Value Date/Time   SDES BLOOD RIGHT HAND 11/14/2021 1616   SDES BLOOD RIGHT WRIST 11/14/2021 1616   SPECREQUEST  11/14/2021 1616    BOTTLES DRAWN AEROBIC ONLY Blood Culture results may not be optimal due to an inadequate volume of blood received in culture bottles   SPECREQUEST  11/14/2021 1616     BOTTLES DRAWN AEROBIC AND ANAEROBIC Blood Culture results may not be optimal due to an inadequate volume of blood received in culture bottles   CULT  11/14/2021 1616    NO GROWTH 5 DAYS Performed at [facility], [place] [address] [place], [place] [address]    CULT  11/14/2021 1616    NO GROWTH 5 DAYS Performed at [facility] [address], [place], [place] [address]    REPTSTATUS 11/19/2021 FINAL 11/14/2021 1616   REPTSTATUS 11/19/2021 FINAL 11/14/2021 1616    Cardiac Enzymes: No results for input(s): "CKTOTAL", "CKMB", "CKMBINDEX", "TROPONINI" in the last 168 hours. CBG: Recent Labs  Lab 07/05/22 2027 07/05/22 2355 07/06/22 0327 07/06/22 0727 07/06/22 1130  GLUCAP 98 101* 104* 108* 86   Iron Studies: No results for input(s): "IRON", "TIBC", "TRANSFERRIN", "FERRITIN" in the last 72 hours. '@lablastinr3'$ @ Studies/Results: No results found. Medications:  sodium chloride      amiodarone  200 mg Oral Daily  calcitRIOL  0.75 mcg Oral Q T,Th,Sa-HD   carvedilol  12.5 mg Oral Q supper   carvedilol  25 mg Oral Q breakfast   cinacalcet  60 mg Oral Q T,Th,Sa-HD   glucose  4 tablet Oral Once   multivitamin  1 tablet Oral QHS   pantoprazole  40 mg Oral Daily   sertraline  25 mg Oral Daily   sevelamer carbonate  4,000 mg Oral TID WC   sodium chloride flush  3 mL Intravenous Q12H   vancomycin  125 mg Oral QID     OP HD: TTS [facility] 4h  400/1.5  68.9kg  2/2 bath  LFA AVF  Hep none - last HD 12/19, post wt 71.1kg  - venofer 50  IVq wk - rocaltrol 0.75 mcg po tiw - sensipar 60 mg po tiw - mircera 200 MCG IV q2, last 12/7, due 12/21      CXR - no active disease   Assessment/ Plan: Severe hyperkalemia in setting of missed HD - Resolved with HD  + C diff-oral vanc per primary  + COVID-19 per primary PAF-rate controlled. Continue carvedilol. Not on AC.  ESRD - on HD TTS. Hx of missed HD. Next HD 07/07/2022.  HTN/ vol - Under EDW with soft BP. Hold Amlodipine,  continue carvedilol for rate control. Lower EDW on discharge.  MBD ckd - CCa in range, add on phos. Cont renvela as binder and po vdra w/ HD.  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 May come down w/ rehydration.  Obera Stauch H. Sadhana Frater NP-C 07/06/2022, 12:13 PM  [employer] [phone]

## 2022-07-06 NOTE — Evaluation (Signed)
Physical Therapy Evaluation Patient Details Name: Raymond [name] MRN: [id number] DOB: 08-03-1955 Today's Date: 07/06/2022  History of Present Illness  Pt is a 66 y/o M presenting to ED on 12/24 with flu like symptoms x3 days and hypoglycemia. Found to have COVID-19 and C-diff, admitted for hyperkalemia. PMH includes ESRD on HD TTS, aparoxysmal A fib not on AC, HTN, HFrEF, PAD, COPD.  Clinical Impression  Pt was seen for mobility on RW with help, noted some losses of balance with L side specifically unsteady.  Pt walked to BR with supervision, and got up to come out with no assist and more steadily.  Will need to focus on strengthening and balance skills to return to his home with RW and supervised help.  Will follow along, practice stairs next visit and progress his control of walking as condition permits.  Pt is motivated but also realistic that he will have probably sporadic help at home.       Recommendations for follow up therapy are one component of a multi-disciplinary discharge planning process, led by the attending physician.  Recommendations may be updated based on patient status, additional functional criteria and insurance authorization.  Follow Up Recommendations Home health PT      Assistance Recommended at Discharge Intermittent Supervision/Assistance  Patient can return home with the following  A little help with walking and/or transfers;A little help with bathing/dressing/bathroom;Assistance with cooking/housework;Assist for transportation;Help with stairs or ramp for entrance    Equipment Recommendations Rolling walker (2 wheels)  Recommendations for Other Services       Functional Status Assessment Patient has had a recent decline in their functional status and demonstrates the ability to make significant improvements in function in a reasonable and predictable amount of time.     Precautions / Restrictions Precautions Precautions: Fall Restrictions Weight Bearing  Restrictions: No      Mobility  Bed Mobility               General bed mobility comments: up in chair when PT arrived    Transfers Overall transfer level: Needs assistance Equipment used: Rolling walker (2 wheels) Transfers: Sit to/from Stand Sit to Stand: Min guard                Ambulation/Gait Ambulation/Gait assistance: Min guard Gait Distance (Feet): 45 Feet Assistive device: Rolling walker (2 wheels), 1 person hand held assist Gait Pattern/deviations: Step-through pattern, Decreased stride length, Narrow base of support Gait velocity: reduced Gait velocity interpretation: <1.31 ft/sec, indicative of household ambulator Pre-gait activities: standing balance ck General Gait Details: has occas LOB to L side with need to catch him and support recovery of balance  Stairs            Wheelchair Mobility    Modified Rankin (Stroke Patients Only)       Balance Overall balance assessment: Needs assistance Sitting-balance support: Feet supported Sitting balance-Leahy Scale: Good     Standing balance support: Bilateral upper extremity supported, During functional activity Standing balance-Leahy Scale: Fair Standing balance comment: less than fair dynamically                             Pertinent Vitals/Pain Pain Assessment Pain Assessment: No/denies pain    Home Living Family/patient expects to be discharged to:: Private residence Living Arrangements: Alone Available Help at Discharge: Family;Friend(s) Type of Home: Apartment Home Access: Stairs to enter   Entrance Stairs-Number of Steps: 4   Home  Layout: One level Home Equipment: Cane - single point;Shower Land (2 wheels) Additional Comments: pt is normally home with SPC at most used    Prior Function Prior Level of Function : Independent/Modified Independent             Mobility Comments: occasionally cane ADLs Comments: has transport for HD     Hand  Dominance   Dominant Hand: Right    Extremity/Trunk Assessment   Upper Extremity Assessment Upper Extremity Assessment: Defer to OT evaluation    Lower Extremity Assessment Lower Extremity Assessment: Generalized weakness (at hips)    Cervical / Trunk Assessment Cervical / Trunk Assessment: Normal  Communication   Communication: HOH;No difficulties  Cognition Arousal/Alertness: Awake/alert Behavior During Therapy: WFL for tasks assessed/performed Overall Cognitive Status: Within Functional Limits for tasks assessed                                          General Comments General comments (skin integrity, edema, etc.): Pt is demonstrating poor strength and sensation on R foot and ankle.  This is concerning to him as well as a fall risk    Exercises     Assessment/Plan    PT Assessment Patient needs continued PT services  PT Problem List Decreased strength;Decreased range of motion;Decreased activity tolerance;Decreased balance;Decreased mobility;Decreased coordination;Decreased knowledge of use of DME;Decreased safety awareness;Decreased skin integrity;Pain       PT Treatment Interventions DME instruction;Gait training;Stair training;Functional mobility training;Therapeutic activities;Therapeutic exercise;Balance training;Neuromuscular re-education;Patient/family education    PT Goals (Current goals can be found in the Care Plan section)  Acute Rehab PT Goals Patient Stated Goal: to walk alone and get home PT Goal Formulation: With patient Time For Goal Achievement: 07/13/22 Potential to Achieve Goals: Good    Frequency Min 3X/week     Co-evaluation               AM-PAC PT "6 Clicks" Mobility  Outcome Measure Help needed turning from your back to your side while in a flat bed without using bedrails?: None Help needed moving from lying on your back to sitting on the side of a flat bed without using bedrails?: A Little Help needed moving to  and from a bed to a chair (including a wheelchair)?: A Little Help needed standing up from a chair using your arms (e.g., wheelchair or bedside chair)?: A Little Help needed to walk in hospital room?: A Little Help needed climbing 3-5 steps with a railing? : A Little 6 Click Score: 19    End of Session Equipment Utilized During Treatment: Gait belt Activity Tolerance: Patient limited by fatigue;Patient limited by pain Patient left: in bed;with call bell/phone within reach;with bed alarm set Nurse Communication: Mobility status PT Visit Diagnosis: Unsteadiness on feet (R26.81);Muscle weakness (generalized) (M62.81);Pain Pain - Right/Left: Right Pain - part of body: Ankle and joints of foot    Time: 1243-1319 PT Time Calculation (min) (ACUTE ONLY): 36 min   Charges:   PT Evaluation $PT Eval Moderate Complexity: 1 Mod PT Treatments $Gait Training: 8-22 mins       Ramond Dial 07/06/2022, 5:05 PM  Mee Hives, PT PhD Acute Rehab Dept. Number: [facility] [place]

## 2022-07-07 ENCOUNTER — Inpatient Hospital Stay (HOSPITAL_COMMUNITY): Payer: Medicare Other

## 2022-07-07 DIAGNOSIS — N186 End stage renal disease: Secondary | ICD-10-CM | POA: Diagnosis not present

## 2022-07-07 DIAGNOSIS — R9431 Abnormal electrocardiogram [ECG] [EKG]: Secondary | ICD-10-CM | POA: Diagnosis not present

## 2022-07-07 DIAGNOSIS — E875 Hyperkalemia: Secondary | ICD-10-CM | POA: Diagnosis not present

## 2022-07-07 DIAGNOSIS — E1129 Type 2 diabetes mellitus with other diabetic kidney complication: Secondary | ICD-10-CM | POA: Diagnosis not present

## 2022-07-07 DIAGNOSIS — I4819 Other persistent atrial fibrillation: Secondary | ICD-10-CM | POA: Diagnosis not present

## 2022-07-07 DIAGNOSIS — I4892 Unspecified atrial flutter: Secondary | ICD-10-CM

## 2022-07-07 LAB — RENAL FUNCTION PANEL
Albumin: 3.1 g/dL — ABNORMAL LOW (ref 3.5–5.0)
Anion gap: 9 (ref 5–15)
BUN: 21 mg/dL (ref 8–23)
CO2: 33 mmol/L — ABNORMAL HIGH (ref 22–32)
Calcium: 9.2 mg/dL (ref 8.9–10.3)
Chloride: 93 mmol/L — ABNORMAL LOW (ref 98–111)
Creatinine, Ser: 6.27 mg/dL — ABNORMAL HIGH (ref 0.61–1.24)
GFR, Estimated: 9 mL/min — ABNORMAL LOW (ref >60)
Glucose, Bld: 86 mg/dL (ref 70–99)
Phosphorus: 3.4 mg/dL (ref 2.5–4.6)
Potassium: 3.9 mmol/L (ref 3.5–5.1)
Sodium: 135 mmol/L (ref 135–145)

## 2022-07-07 LAB — GLUCOSE, CAPILLARY
Glucose-Capillary: 118 mg/dL — ABNORMAL HIGH (ref 70–99)
Glucose-Capillary: 118 mg/dL — ABNORMAL HIGH (ref 70–99)
Glucose-Capillary: 125 mg/dL — ABNORMAL HIGH (ref 70–99)
Glucose-Capillary: 130 mg/dL — ABNORMAL HIGH (ref 70–99)
Glucose-Capillary: 81 mg/dL (ref 70–99)
Glucose-Capillary: 90 mg/dL (ref 70–99)
Glucose-Capillary: 99 mg/dL (ref 70–99)

## 2022-07-07 LAB — CBC WITH DIFFERENTIAL/PLATELET
Abs Immature Granulocytes: 0.01 10*3/uL (ref 0.00–0.07)
Basophils Absolute: 0 10*3/uL (ref 0.0–0.1)
Basophils Relative: 0 %
Eosinophils Absolute: 0.1 10*3/uL (ref 0.0–0.5)
Eosinophils Relative: 2 %
HCT: 34.2 % — ABNORMAL LOW (ref 39.0–52.0)
Hemoglobin: 11 g/dL — ABNORMAL LOW (ref 13.0–17.0)
Immature Granulocytes: 0 %
Lymphocytes Relative: 31 %
Lymphs Abs: 1.1 10*3/uL (ref 0.7–4.0)
MCH: 30.5 pg (ref 26.0–34.0)
MCHC: 32.2 g/dL (ref 30.0–36.0)
MCV: 94.7 fL (ref 80.0–100.0)
Monocytes Absolute: 0.6 10*3/uL (ref 0.1–1.0)
Monocytes Relative: 18 %
Neutro Abs: 1.7 10*3/uL (ref 1.7–7.7)
Neutrophils Relative %: 49 %
Platelets: 127 10*3/uL — ABNORMAL LOW (ref 150–400)
RBC: 3.61 MIL/uL — ABNORMAL LOW (ref 4.22–5.81)
RDW: 17.7 % — ABNORMAL HIGH (ref 11.5–15.5)
WBC: 3.5 10*3/uL — ABNORMAL LOW (ref 4.0–10.5)
nRBC: 0 % (ref 0.0–0.2)

## 2022-07-07 LAB — BASIC METABOLIC PANEL
Anion gap: 10 (ref 5–15)
BUN: 43 mg/dL — ABNORMAL HIGH (ref 8–23)
CO2: 30 mmol/L (ref 22–32)
Calcium: 9.2 mg/dL (ref 8.9–10.3)
Chloride: 96 mmol/L — ABNORMAL LOW (ref 98–111)
Creatinine, Ser: 9.65 mg/dL — ABNORMAL HIGH (ref 0.61–1.24)
GFR, Estimated: 5 mL/min — ABNORMAL LOW (ref >60)
Glucose, Bld: 117 mg/dL — ABNORMAL HIGH (ref 70–99)
Potassium: 4.2 mmol/L (ref 3.5–5.1)
Sodium: 136 mmol/L (ref 135–145)

## 2022-07-07 LAB — ECHOCARDIOGRAM COMPLETE
Area-P 1/2: 3.97 cm2
S' Lateral: 3.6 cm
Weight: 2209.6 oz

## 2022-07-07 LAB — CBC
HCT: 38 % — ABNORMAL LOW (ref 39.0–52.0)
Hemoglobin: 11.7 g/dL — ABNORMAL LOW (ref 13.0–17.0)
MCH: 29.3 pg (ref 26.0–34.0)
MCHC: 30.8 g/dL (ref 30.0–36.0)
MCV: 95.2 fL (ref 80.0–100.0)
Platelets: 139 10*3/uL — ABNORMAL LOW (ref 150–400)
RBC: 3.99 MIL/uL — ABNORMAL LOW (ref 4.22–5.81)
RDW: 17.5 % — ABNORMAL HIGH (ref 11.5–15.5)
WBC: 3.4 10*3/uL — ABNORMAL LOW (ref 4.0–10.5)
nRBC: 0 % (ref 0.0–0.2)

## 2022-07-07 MED ORDER — LIDOCAINE HCL (PF) 1 % IJ SOLN: 5.0000 mL | INTRAMUSCULAR | Status: DC | PRN

## 2022-07-07 MED ORDER — ATORVASTATIN CALCIUM 40 MG PO TABS
40.0000 mg | ORAL_TABLET | Freq: Every day | ORAL | Status: DC
  Administered 2022-07-07 – 2022-07-08 (×2): 40 mg via ORAL
  Filled 2022-07-07 (×2): qty 1

## 2022-07-07 MED ORDER — ASPIRIN 81 MG PO CHEW
81.0000 mg | CHEWABLE_TABLET | Freq: Every day | ORAL | Status: DC
  Administered 2022-07-07 – 2022-07-08 (×2): 81 mg via ORAL
  Filled 2022-07-07 (×2): qty 1

## 2022-07-07 MED ORDER — ANTICOAGULANT SODIUM CITRATE 4% (200MG/5ML) IV SOLN: 5.0000 mL | Status: DC | PRN

## 2022-07-07 MED ORDER — LIDOCAINE-PRILOCAINE 2.5-2.5 % EX CREA
1.0000 | TOPICAL_CREAM | CUTANEOUS | Status: DC | PRN
  Filled 2022-07-07: qty 5

## 2022-07-07 MED ORDER — ALTEPLASE 2 MG IJ SOLR: 2.0000 mg | Freq: Once | INTRAMUSCULAR | Status: DC | PRN

## 2022-07-07 MED ORDER — NEPRO/CARBSTEADY PO LIQD
237.0000 mL | Freq: Two times a day (BID) | ORAL | Status: DC
  Administered 2022-07-08 (×2): 237 mL via ORAL

## 2022-07-07 MED ORDER — PERFLUTREN LIPID MICROSPHERE
1.0000 mL | INTRAVENOUS | Status: AC | PRN
  Administered 2022-07-07: 2 mL via INTRAVENOUS

## 2022-07-07 MED ORDER — CARVEDILOL 25 MG PO TABS
25.0000 mg | ORAL_TABLET | Freq: Two times a day (BID) | ORAL | Status: DC
  Administered 2022-07-07 – 2022-07-08 (×2): 25 mg via ORAL
  Filled 2022-07-07 (×2): qty 1

## 2022-07-07 MED ORDER — HEPARIN SODIUM (PORCINE) 1000 UNIT/ML DIALYSIS: 1000.0000 [IU] | INTRAMUSCULAR | Status: DC | PRN

## 2022-07-07 MED ORDER — PENTAFLUOROPROP-TETRAFLUOROETH EX AERO: 1.0000 | INHALATION_SPRAY | CUTANEOUS | Status: DC | PRN

## 2022-07-07 NOTE — Progress Notes (Signed)
PROGRESS NOTE    Raymond [name]  ZOX:[id number] DOB: 01-21-1956 DOA: 07/03/2022 PCP: [name], FNP   Brief Narrative: Raymond [name] is a 66 y.o. male with a history of ESRD on HD, paroxysmal atrial fibrillation/atrial flutter not on anticoagulation, hypertension, chronic systolic heart failure, PAD, COPD, secondary hyperparathyroidism. Patient presented secondary to weakness and found to have hyperkalemia with hypoglycemia. Hypoglycemia managed with dextrose IV fluids and hyperkalemia managed with Lokelma/HD. Prior to discharge, patient developed significant EKG changes in anterolateral distribution. Cardiology consulted.   Assessment and Plan:  Hyperkalemia Secondary to missed hemodialysis. Resolved with resumption of HD.  ESRD on HD Nephrology consulted for hemodialysis.  Abnormal EKG Patient developed acute changes in telemetry during hemodialysis. EKG post-HD confirms anterolateral T-wave inversions. Patient without chest pain. -Transthoracic Echocardiogram -Cardiology consult  Hypoglycemia Possible related to acute infection. Resolved with IV dextrose infusion which has been discontinued.  Diabetes mellitus type 2 Uncontrolled with hypoglycemia. Hemoglobin A1C of 4.6% from 09/2021.  Primary hypertension -Continue Coreg  COVID-19 infection Asymptomatic on admission with symptoms subsiding prior to admission. No evidence of pneumonia.  Clostridium difficile infection Antigen and PCR positive with negative toxin. Patient started on Vancomycin PO -Continue Vancomycin PO  Paroxysmal atrial fibrillation Atrial flutter -Continue Coreg 25 mg BID  COPD Stable.  Elevated LFTs In setting of viral infection. Outpatient follow-up.  Metabolic acidosis Likely related to ESRD and missed HD. Resolved.  Anemia of chronic disease Hemoglobin stable.  Thrombocytopenia Likely reactive and secondary to infection. Improving.    DVT prophylaxis: SCDs Code Status:    Code Status: Full Code Family Communication: None at bedside Disposition Plan: Discharge home pending cardiology recommendations. Anticipate discharge possible in 1 day   Consultants:  Cardiology  Procedures:  12/28: Transthoracic Echocardiogram   Antimicrobials: Vancomycin PO    Subjective: Patient reports no issues this morning. Bowel movements are improving in consistency.  Objective: BP 133/80 (BP Location: Right Arm)   Pulse 81   Temp 97.8 F (36.6 C) (Oral)   Resp 18   Wt 62.6 kg   SpO2 100%   BMI 21.00 kg/m   Examination:  General exam: Appears calm and comfortable Respiratory system: Clear to auscultation. Respiratory effort normal. Cardiovascular system: S1 & S2 heard, RRR. Gastrointestinal system: Abdomen is nondistended, soft and nontender. Normal bowel sounds heard. Central nervous system: Alert and oriented. No focal neurological deficits. Musculoskeletal: No edema. No calf tenderness Psychiatry: Judgement and insight appear normal. Mood & affect appropriate.    Data Reviewed: I have personally reviewed following labs and imaging studies  CBC Lab Results  Component Value Date   WBC 3.4 (L) 07/07/2022   RBC 3.99 (L) 07/07/2022   HGB 11.7 (L) 07/07/2022   HCT 38.0 (L) 07/07/2022   MCV 95.2 07/07/2022   MCH 29.3 07/07/2022   PLT 139 (L) 07/07/2022   MCHC 30.8 07/07/2022   RDW 17.5 (H) 07/07/2022   LYMPHSABS 1.1 07/07/2022   MONOABS 0.6 07/07/2022   EOSABS 0.1 07/07/2022   BASOSABS 0.0 07/07/2022     Last metabolic panel Lab Results  Component Value Date   NA 135 07/07/2022   K 3.9 07/07/2022   CL 93 (L) 07/07/2022   CO2 33 (H) 07/07/2022   BUN 21 07/07/2022   CREATININE 6.27 (H) 07/07/2022   GLUCOSE 86 07/07/2022   GFRNONAA 9 (L) 07/07/2022   GFRAA 6 (L) 01/18/2020   CALCIUM 9.2 07/07/2022   PHOS 3.4 07/07/2022   PROT 7.0 07/04/2022  ALBUMIN 3.1 (L) 07/07/2022   LABGLOB 3.9 05/15/2018   AGRATIO 0.9 (L) 05/15/2018   BILITOT 1.7  (H) 07/04/2022   ALKPHOS 93 07/04/2022   AST 36 07/04/2022   ALT 40 07/04/2022   ANIONGAP 9 07/07/2022    GFR: CrCl cannot be calculated (Unknown ideal weight.).  Recent Results (from the past 240 hour(s))  Resp panel by RT-PCR (RSV, Flu A&B, Covid) Anterior Nasal Swab     Status: Abnormal   Collection Time: 07/03/22  8:16 PM   Specimen: Anterior Nasal Swab  Result Value Ref Range Status   SARS Coronavirus 2 by RT PCR POSITIVE (A) NEGATIVE Final    Comment: (NOTE) SARS-CoV-2 target nucleic acids are DETECTED.  The SARS-CoV-2 RNA is generally detectable in upper respiratory specimens during the acute phase of infection. Positive results are indicative of the presence of the identified virus, but do not rule out bacterial infection or co-infection with other pathogens not detected by the test. Clinical correlation with patient history and other diagnostic information is necessary to determine patient infection status. The expected result is Negative.  Fact Sheet for Patients: BloggerCourse.com  Fact Sheet for Healthcare Providers: SeriousBroker.it  This test is not yet approved or cleared by the [place] FDA and  has been authorized for detection and/or diagnosis of SARS-CoV-2 by FDA under an Emergency Use Authorization (EUA).  This EUA will remain in effect (meaning this test can be used) for the duration of  the COVID-19 declaration under Section 564(b)(1) of the A ct, 21 U.S.C. section 360bbb-3(b)(1), unless the authorization is terminated or revoked sooner.     Influenza A by PCR NEGATIVE NEGATIVE Final   Influenza B by PCR NEGATIVE NEGATIVE Final    Comment: (NOTE) The Xpert Xpress SARS-CoV-2/FLU/RSV plus assay is intended as an aid in the diagnosis of influenza from Nasopharyngeal swab specimens and should not be used as a sole basis for treatment. Nasal washings and aspirates are unacceptable for Xpert Xpress  SARS-CoV-2/FLU/RSV testing.  Fact Sheet for Patients: BloggerCourse.com  Fact Sheet for Healthcare Providers: SeriousBroker.it  This test is not yet approved or cleared by the [place] FDA and has been authorized for detection and/or diagnosis of SARS-CoV-2 by FDA under an Emergency Use Authorization (EUA). This EUA will remain in effect (meaning this test can be used) for the duration of the COVID-19 declaration under Section 564(b)(1) of the Act, 21 U.S.C. section 360bbb-3(b)(1), unless the authorization is terminated or revoked.     Resp Syncytial Virus by PCR NEGATIVE NEGATIVE Final    Comment: (NOTE) Fact Sheet for Patients: BloggerCourse.com  Fact Sheet for Healthcare Providers: SeriousBroker.it  This test is not yet approved or cleared by the [place] FDA and has been authorized for detection and/or diagnosis of SARS-CoV-2 by FDA under an Emergency Use Authorization (EUA). This EUA will remain in effect (meaning this test can be used) for the duration of the COVID-19 declaration under Section 564(b)(1) of the Act, 21 U.S.C. section 360bbb-3(b)(1), unless the authorization is terminated or revoked.  Performed at [facility], [address], [place], [place] [address]   C Difficile Quick Screen w PCR reflex     Status: Abnormal   Collection Time: 07/03/22 10:27 PM   Specimen: STOOL  Result Value Ref Range Status   C Diff antigen POSITIVE (A) NEGATIVE Final   C Diff toxin NEGATIVE NEGATIVE Final   C Diff interpretation Results are indeterminate. See PCR results.  Final    Comment:  Performed at [facility], [address], [place], [place] [address]  C. Diff by PCR, Reflexed     Status: Abnormal   Collection Time: 07/03/22 10:27 PM  Result Value Ref Range Status   Toxigenic C. Difficile by PCR POSITIVE (A) NEGATIVE Final    Comment: Positive for  toxigenic C. difficile with little to no toxin production. Only treat if clinical presentation suggests symptomatic illness. Performed at [facility], [address] [place], [place] [address]       Radiology Studies: ECHOCARDIOGRAM COMPLETE  Result Date: 07/07/2022    ECHOCARDIOGRAM REPORT   Patient Name:   HARDIK FARINHA Saathoff Date of Exam: 07/07/2022 Medical Rec #:  [id number]     Height:       68.0 in Accession #:    [id number]    Weight:       138.1 lb Date of Birth:  01-30-1956     BSA:          1.746 m Patient Age:    66 years      BP:           133/80 mmHg Patient Gender: M             HR:           80 bpm. Exam Location:  Inpatient Procedure: 2D Echo, Cardiac Doppler and Color Doppler Indications:    Abnormal ECG R94.31  History:        Patient has prior history of Echocardiogram examinations, most                 recent 04/01/2024. CAD, COPD, Arrythmias:Atrial Fibrillation;                 Risk Factors:Diabetes, Hypertension, Dyslipidemia, Non-Smoker                 and Sleep Apnea.  Sonographer:    Aron Baba Referring Phys: [phone] Hong Moring A Santosha Jividen  Sonographer Comments: Image acquisition challenging due to respiratory motion. IMPRESSIONS  1. Left ventricular ejection fraction, by estimation, is 45 to 50%. The left ventricle has mildly decreased function. The left ventricle demonstrates global hypokinesis. 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 Left ventricular diastolic parameters are indeterminate.  2. Right ventricular systolic function is normal. The right ventricular size is normal. There is normal pulmonary artery systolic pressure.  3. Left atrial size was severely dilated.  4. Right atrial size was severely dilated.  5. The mitral valve is normal in structure. No evidence of mitral valve regurgitation. No evidence of mitral stenosis. Moderate mitral annular calcification.  6. Tricuspid valve regurgitation is moderate.  7. The aortic valve is  calcified. There is moderate calcification of the aortic valve. There is moderate thickening of the aortic valve. Aortic valve regurgitation is not visualized. Aortic valve sclerosis/calcification is present, without any evidence of aortic stenosis.  8. 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  9. The inferior vena cava is normal in size with greater than 50% respiratory variability, suggesting right atrial pressure of 3 mmHg. FINDINGS  Left Ventricle: Left ventricular ejection fraction, by estimation, is 45 to 50%. The left ventricle has mildly decreased function. The left ventricle demonstrates global hypokinesis. The left ventricular internal cavity size was normal in size. There is  severe asymmetric left ventricular hypertrophy of the inferior segment. Left ventricular diastolic function could  not be evaluated due to mitral annular calcification (moderate or greater). Left ventricular diastolic parameters are indeterminate. Right Ventricle: The right ventricular size is normal. No increase in right ventricular wall thickness. Right ventricular systolic function is normal. There is normal pulmonary artery systolic pressure. The tricuspid regurgitant velocity is 1.75 m/s, and  with an assumed right atrial pressure of 3 mmHg, the estimated right ventricular systolic pressure is 15.2 mmHg. Left Atrium: Left atrial size was severely dilated. Right Atrium: Right atrial size was severely dilated. Pericardium: There is no evidence of pericardial effusion. Mitral Valve: The mitral valve is normal in structure. Moderate mitral annular calcification. No evidence of mitral valve regurgitation. No evidence of mitral valve stenosis. Tricuspid Valve: The tricuspid valve is normal in structure. Tricuspid valve regurgitation is moderate . No evidence of tricuspid stenosis. Aortic Valve: The aortic valve is calcified. There is moderate calcification of  the aortic valve. There is moderate thickening of the aortic valve. There is moderate aortic valve annular calcification. Aortic valve regurgitation is not visualized. Aortic  valve sclerosis/calcification is present, without any evidence of aortic stenosis. Pulmonic Valve: The pulmonic valve was normal in structure. Pulmonic valve regurgitation is trivial. No evidence of pulmonic stenosis. Aorta: The aortic root is normal in size and structure. 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 Venous: The inferior vena cava is normal in size with greater than 50% respiratory variability, suggesting right atrial pressure of 3 mmHg. IAS/Shunts: No atrial level shunt detected by color flow Doppler.  LEFT VENTRICLE PLAX 2D LVIDd:         4.00 cm   Diastology LVIDs:         3.60 cm   LV e' medial:    3.48 cm/s LV PW:         1.60 cm   LV E/e' medial:  19.3 LV IVS:        0.90 cm   LV e' lateral:   9.25 cm/s LVOT diam:     2.00 cm   LV E/e' lateral: 7.3 LV SV:         34 LV SV Index:   19 LVOT Area:     3.14 cm  RIGHT VENTRICLE RV S prime:     10.30 cm/s TAPSE (M-mode): 0.8 cm LEFT ATRIUM              Index        RIGHT ATRIUM           Index LA diam:        4.40 cm  2.52 cm/m   RA Area:     32.70 cm LA Vol (A2C):   89.2 ml  51.09 ml/m  RA Volume:   136.00 ml 77.89 ml/m LA Vol (A4C):   140.0 ml 80.18 ml/m LA Biplane Vol: 110.0 ml 63.00 ml/m  AORTIC VALVE LVOT Vmax:   69.20 cm/s LVOT Vmean:  45.900 cm/s LVOT VTI:    0.108 m  AORTA Ao Root diam: 4.10 cm Ao Asc diam:  4.30 cm MITRAL VALVE               TRICUSPID VALVE MV Area (PHT): 3.97 cm    TR Peak grad:   12.2 mmHg MV Decel Time: 191 msec    TR Vmax:        175.00 cm/s MV E velocity: 67.30 cm/s MV A velocity: 37.70 cm/s  SHUNTS MV E/A  ratio:  1.79        Systemic VTI:  0.11 m                            Systemic Diam: 2.00 cm Lavona Mound Tobb DO Electronically signed by Thomasene Ripple DO Signature  Date/Time: 07/07/2022/5:27:57 PM    Final       LOS: 3 days    Jacquelin Hawking, MD [facility] 07/07/2022, 5:53 PM   If 7PM-7AM, please contact night-coverage [email or web address]

## 2022-07-07 NOTE — Progress Notes (Signed)
Initial Nutrition Assessment  DOCUMENTATION CODES:   Not applicable  INTERVENTION:  - Add Nepro Shake po BID, each supplement provides 425 kcal and 19 grams protein.   NUTRITION DIAGNOSIS:   Increased nutrient needs related to chronic illness as evidenced by estimated needs.  GOAL:   Patient will meet greater than or equal to 90% of their needs  MONITOR:   PO intake, Supplement acceptance  REASON FOR ASSESSMENT:   Consult Assessment of nutrition requirement/status  ASSESSMENT:   66 y.o. male admits related to extremity weakness and hypoglycemia. PMH includes: ESRD on HD, afib, HTN, PAD, COPD. Pt is currently receiving medical management for hyperkalemia.  Meds reviewed: calcitriol, rena-vit. Labs reviewed: BUN/Creatinine elevated.   Pt out of room at time of assessment. Per record, pt has been eating 50-100% of his meals since admission. No significant wt loss per record. The pt is likely meeting his needs at this time. Will add Nepro BID for now. Will continue to monitor PO intakes.   NUTRITION - FOCUSED PHYSICAL EXAM:  Unable to assess, attempt at f/u.   Diet Order:   Diet Order             Diet renal with fluid restriction Fluid restriction: 1200 mL Fluid; Room service appropriate? Yes; Fluid consistency: Thin  Diet effective now                   EDUCATION NEEDS:   Not appropriate for education at this time  Skin:  Skin Assessment: Reviewed RN Assessment  Last BM:  07/04/22  Height:   Ht Readings from Last 1 Encounters:  12/29/21 '5\' 8"'$  (1.727 m)    Weight:   Wt Readings from Last 1 Encounters:  07/07/22 62.6 kg    Ideal Body Weight:     BMI:  Body mass index is 21 kg/m.  Estimated Nutritional Needs:   Kcal:  2707-8675 kcals  Protein:  80-95 gm  Fluid:  >/= 1.5 L  Thalia Bloodgood, RD, LDN, CNSC.

## 2022-07-07 NOTE — Progress Notes (Signed)
Tele called. Pt had changes in MCL lead and V lead. Provider notified. Pt receiving dialysis at bedside. Dialysis nurse at bedside with pt.

## 2022-07-07 NOTE — Consult Note (Signed)
Cardiology Consultation:  Patient ID: Raymond [name] MRN: [id number]; DOB: May 17, 1956  Admit date: 07/03/2022 Date of Consult: 07/07/2022  Primary Care Provider: [name], FNP Primary Cardiologist: Jenkins Rouge, MD  Primary Electrophysiologist:  None   Patient Profile:  Raymond [name] is a 66 y.o. male with a hx of ESRD on hemodialysis, PAD status post femoropopliteal bypass, nonobstructive CAD, hypertension, atrial fibrillation/flutter, polysubstance abuse who is being seen today for the evaluation of abnormal EKG at the request of Cordelia Poche, MD.  History of Present Illness:  Raymond [name] was admitted to the hospital on 07/03/2022 after missing 2 hemodialysis sessions.  He reports he became short of breath and has felt fatigued.  He was found to have COVID-19 infection here.  He was also noted to be severely hyperkalemic to a value of 7.5.  Course is also been complicated by C. difficile colitis.  Currently on vancomycin.  His EKG was obtained which showed deep anterolateral T wave inversions.  Cardiology was consulted for further management.  He reports no chest pain or trouble breathing.  He had significant shortness of breath prior to admission but symptoms are improving with dialysis.  He is not volume overloaded.  His medical history is significant for nonobstructive CAD on left heart catheterization in 2018.  He does have a history of PAD status post femoropopliteal bypass in the past.  His potassium has improved to a value of 4.2.  All of his other laboratory data are stable.  An echocardiogram last year demonstrated normal LV function with severe LVH.  T wave inversions are new.  He does have a longstanding history of rate controlled atrial fibrillation/flutter.  In June he was on anticoagulation however not receiving it here.  Notes continue to mention he is not on anticoagulation due to history of severe GI bleed.  Of note he has several admissions this year for missing hemodialysis.   It appears he is not that strict his dialysis regimen.  He also has a history of cocaine use.  Heart Pathway Score:       Past Medical History: Past Medical History:  Diagnosis Date   Anemia    CAD (coronary artery disease)    a. 03/2018: cath showing 20 to 30% stenosis along the LAD, luminal irregularities along the RCA, and angiographically normal LCx   COPD (chronic obstructive pulmonary disease) (HCC)    ESRD (end stage renal disease) on dialysis ([place])    "MWF; [facility] Rinks" (07/22/17)   Hepatitis C    Still positive s/p liver biopsy at [facility]  and interferon therapy for 6 months. Most recent lab work was on 10/24/12   Hepatitis C    "took the tx; gone now" (12/05/2016)  Was treated   History of blood transfusion ~ 2012/2013   "related to my kidneys; blood was low"   Hypertension    Peripheral arterial disease ([place])    Substance abuse ([place])    Thyroid disease    Type 2 diabetes mellitus with other diabetic kidney complication ([place]) 7/82/9562    Past Surgical History: Past Surgical History:  Procedure Laterality Date   ABDOMINAL AORTOGRAM W/LOWER EXTREMITY N/A 02/08/2018   Procedure: ABDOMINAL AORTOGRAM W/LOWER EXTREMITY;  Surgeon: Conrad Winslow, MD;  Location: [place] [facility];  Service: Cardiovascular;  Laterality: N/A;   ABDOMINAL AORTOGRAM W/LOWER EXTREMITY Bilateral 06/15/2020   Procedure: ABDOMINAL AORTOGRAM W/LOWER EXTREMITY;  Surgeon: Waynetta Sandy, MD;  Location: [place] CV LAB;  Service: Cardiovascular;  Laterality: Bilateral;   AV FISTULA PLACEMENT Left Aug. 2013 ?   BIOPSY  01/17/2020   Procedure: BIOPSY;  Surgeon: Jackquline Denmark, MD;  Location: [place];  Service: Endoscopy;;   COLONOSCOPY WITH PROPOFOL N/A 08/21/2018   Procedure: COLONOSCOPY WITH PROPOFOL;  Surgeon: Yetta Flock, MD;  Location: [employer];  Service: Gastroenterology;  Laterality: N/A;   ENTEROSCOPY N/A 01/17/2020   Procedure: ENTEROSCOPY;  Surgeon: Jackquline Denmark, MD;   Location: [facility] ENDOSCOPY;  Service: Endoscopy;  Laterality: N/A;   ENTEROSCOPY N/A 07/31/2020   Procedure: ENTEROSCOPY;  Surgeon: Doran Stabler, MD;  Location: [place];  Service: Gastroenterology;  Laterality: N/A;   ENTEROSCOPY N/A 01/27/2021   Procedure: ENTEROSCOPY;  Surgeon: Yetta Flock, MD;  Location: [facility] ENDOSCOPY;  Service: Gastroenterology;  Laterality: N/A;   ESOPHAGOGASTRODUODENOSCOPY (EGD) WITH PROPOFOL N/A 08/20/2018   Procedure: ESOPHAGOGASTRODUODENOSCOPY (EGD) WITH PROPOFOL;  Surgeon: Gatha Mayer, MD;  Location: [place];  Service: Endoscopy;  Laterality: N/A;   FEMORAL-POPLITEAL BYPASS GRAFT Left 04/11/2018   Procedure: BYPASS GRAFT FEMORAL-POPLITEAL ARTERY LEFT LEG;  Surgeon: Rosetta Posner, MD;  Location[facility] OR;  Service: Vascular;  Laterality: Left;   GIVENS CAPSULE STUDY N/A 07/31/2020   Procedure: GIVENS CAPSULE STUDY;  Surgeon: Doran Stabler, MD;  Location: [place];  Service: Gastroenterology;  Laterality: N/A;   HOT HEMOSTASIS N/A 08/20/2018   Procedure: HOT HEMOSTASIS (ARGON PLASMA COAGULATION/BICAP);  Surgeon: Gatha Mayer, MD;  Location: [facility] ENDOSCOPY;  Service: Endoscopy;  Laterality: N/A;   HOT HEMOSTASIS N/A 01/17/2020   Procedure: HOT HEMOSTASIS (ARGON PLASMA COAGULATION/BICAP);  Surgeon: Jackquline Denmark, MD;  Location: [facility] ENDOSCOPY;  Service: Endoscopy;  Laterality: N/A;   HOT HEMOSTASIS N/A 01/27/2021   Procedure: HOT HEMOSTASIS (ARGON PLASMA COAGULATION/BICAP);  Surgeon: Yetta Flock, MD;  Location: [facility] ENDOSCOPY;  Service: Gastroenterology;  Laterality: N/A;   LEFT HEART CATH AND CORONARY ANGIOGRAPHY N/A 03/29/2018   Procedure: LEFT HEART CATH AND CORONARY ANGIOGRAPHY;  Surgeon: Nelva Bush, MD;  Location: [place] CV LAB;  Service: Cardiovascular;  Laterality: N/A;   LIVER BIOPSY     ORIF ULNAR FRACTURE Left 05/18/2017   Procedure: OPEN REDUCTION INTERNAL FIXATION (ORIF) ULNAR FRACTURE;  Surgeon: Iran Planas, MD;  Location:  [place];  Service: Orthopedics;  Laterality: Left;   PERIPHERAL VASCULAR INTERVENTION Right 06/15/2020   Procedure: PERIPHERAL VASCULAR INTERVENTION;  Surgeon: Waynetta Sandy, MD;  Location: [place] CV LAB;  Service: Cardiovascular;  Laterality: Right;   POLYPECTOMY  08/21/2018   Procedure: POLYPECTOMY;  Surgeon: Yetta Flock, MD;  Location: [place];  Service: Gastroenterology;;   Earney Mallet N/A 12/11/2012   Procedure: Earney Mallet;  Surgeon: Serafina Mitchell, MD;  Location: [facility] C[facility] LAB;  Service: Cardiovascular;  Laterality: N/A;   WOUND EXPLORATION Left 06/15/2020   Procedure: GROIN  EXPLORATION;  Surgeon: Waynetta Sandy, MD;  Location: [place];  Service: Vascular;  Laterality: Left;     Home Medications:  Prior to Admission medications   Medication Sig Start Date End Date Taking? Authorizing Provider  amiodarone (PACERONE) 200 MG tablet Take 200 mg by mouth daily. 06/17/21  Yes [provider]  amLODipine (NORVASC) 5 MG tablet Take 5 mg by mouth daily. 07/22/21  Yes [provider]  Adair Patter 200-25 MCG/ACT AEPB Inhale 2 puffs into the lungs daily as needed (for shortness of breath). 09/17/21  Yes [provider]  calcitRIOL (ROCALTROL) 0.5 MCG capsule Take 1 capsule (0.5 mcg total) by mouth Every Tuesday,Thursday,and  Saturday with dialysis. 12/02/21  Yes Mariel Aloe, MD  carvedilol (COREG) 12.5 MG tablet Take 2 tablets (25 mg total) by mouth every morning AND 1 tablet (12.5 mg total) every evening. 12/29/21  Yes Cleaver, Jossie Ng, NP  cinacalcet (SENSIPAR) 30 MG tablet Take 6 tablets (180 mg total) by mouth Every Tuesday,Thursday,and Saturday with dialysis. 12/12/20  Yes Mercy Riding, MD  Darbepoetin Alfa (ARANESP) 200 MCG/0.4ML SOSY injection Inject 0.4 mLs (200 mcg total) into the vein every Tuesday with hemodialysis. 05/25/21  Yes France Ravens, MD  multivitamin (RENA-VIT) TABS tablet Take 1 tablet by mouth at bedtime. 09/17/21  Yes  Sheikh, Omair Latif, DO  pantoprazole (PROTONIX) 40 MG tablet Take 1 tablet (40 mg total) by mouth 2 (two) times daily before a meal. Patient taking differently: Take 40 mg by mouth daily. 09/17/21  Yes Sheikh, Omair Latif, DO  sertraline (ZOLOFT) 25 MG tablet Take 25 mg by mouth daily. 12/07/21  Yes [provider]  sevelamer carbonate (RENVELA) 800 MG tablet Take 5 tablets (4,000 mg total) by mouth 3 (three) times daily with meals. 09/17/21  Yes Sheikh, Omair Latif, DO  albuterol (VENTOLIN HFA) 108 (90 Base) MCG/ACT inhaler Inhale 3 puffs into the lungs daily as needed for shortness of breath or wheezing. 11/06/19   [provider]  apixaban (ELIQUIS) 5 MG TABS tablet Take 1 tablet (5 mg total) by mouth 2 (two) times daily. 06/22/20 08/03/20  Karoline Caldwell, PA-C    Inpatient Medications: Scheduled Meds:  amiodarone  200 mg Oral Daily   calcitRIOL  0.75 mcg Oral Q T,Th,Sa-HD   carvedilol  12.5 mg Oral Q supper   carvedilol  25 mg Oral Q breakfast   Chlorhexidine Gluconate Cloth  6 each Topical Q0600   cinacalcet  60 mg Oral Q T,Th,Sa-HD   glucose  4 tablet Oral Once   multivitamin  1 tablet Oral QHS   pantoprazole  40 mg Oral Daily   sertraline  25 mg Oral Daily   sevelamer carbonate  4,000 mg Oral TID WC   sodium chloride flush  3 mL Intravenous Q12H   vancomycin  125 mg Oral QID   Continuous Infusions:  sodium chloride     anticoagulant sodium citrate     PRN Meds: sodium chloride, alteplase, anticoagulant sodium citrate, dextrose, heparin, lidocaine (PF), lidocaine-prilocaine, pentafluoroprop-tetrafluoroeth, sodium chloride flush  Allergies:    No Known Allergies  Social History:   Social History   Socioeconomic History   Marital status: Single    Spouse name: Not on file   Number of children: 2   Years of education: Not on file   Highest education level: 11th grade  Occupational History   Not on file  Tobacco Use   Smoking status: Every Day     Packs/day: 1.00    Years: 25.00    Total pack years: 25.00    Types: Cigarettes    Last attempt to quit: 08/01/2011    Years since quitting: 10.9   Smokeless tobacco: Never   Tobacco comments:    he smokes 3 cigarettes a day  Vaping Use   Vaping Use: Never used  Substance and Sexual Activity   Alcohol use: Yes    Alcohol/week: 10.0 standard drinks of alcohol    Types: 10 Glasses of wine per week    Comment: drank ETOH Saturday, 8 ounces vodka   Drug use: Not Currently    Types: Cocaine    Comment: not lately  Sexual activity: Not on file  Other Topics Concern   Not on file  Social History Narrative   Patient is living in a apartment alone. Professional cement finisher. Has 2 children (44yo & 11yo sons). Plans to make HCPOA for sister Neoma Laming to make decisions for him should he need them.    Social Determinants of Health   Financial Resource Strain: Low Risk  (12/29/2021)   Overall Financial Resource Strain (CARDIA)    Difficulty of Paying Living Expenses: Not very hard  Food Insecurity: No Food Insecurity (12/29/2021)   Hunger Vital Sign    Worried About Running Out of Food in the Last Year: Never true    Ran Out of Food in the Last Year: Never true  Transportation Needs: No Transportation Needs (12/29/2021)   PRAPARE - Hydrologist (Medical): No    Lack of Transportation (Non-Medical): No  Physical Activity: Not on file  Stress: Not on file  Social Connections: Unknown (08/18/2018)   Social Connection and Isolation Panel [NHANES]    Frequency of Communication with Friends and Family: Three times a week    Frequency of Social Gatherings with Friends and Family: Never    Attends Religious Services: Never    Marine scientist or Organizations: No    Attends Archivist Meetings: Never    Marital Status: Not on file  Intimate Partner Violence: Not At Risk (08/18/2018)   Humiliation, Afraid, Rape, and Kick questionnaire    Fear of  Current or Ex-Partner: No    Emotionally Abused: No    Physically Abused: No    Sexually Abused: No     Family History:   Family History  Problem Relation Age of Onset   Diabetes Father    Hypertension Father    Heart disease Father      ROS:  All other ROS reviewed and negative. Pertinent positives noted in the HPI.     Physical Exam/Data:   Vitals:   07/07/22 1030 07/07/22 1100 07/07/22 1130 07/07/22 1141  BP: (!) 127/90 123/87 124/87   Pulse: 82 79 79   Resp: '18 18 18   '$ Temp:   97.7 F (36.5 C)   TempSrc:   Oral   SpO2: 98% 100% 100%   Weight:    62.6 kg    Intake/Output Summary (Last 24 hours) at 07/07/2022 1537 Last data filed at 07/07/2022 1130 Gross per 24 hour  Intake 600 ml  Output 2400 ml  Net -1800 ml       07/07/2022   11:41 AM 07/07/2022    7:33 AM 07/05/2022    6:13 PM  Last 3 Weights  Weight (lbs) 138 lb 1.6 oz 148 lb 12.8 oz 149 lb 7.6 oz  Weight (kg) 62.642 kg 67.495 kg 67.8 kg    Body mass index is 21 kg/m.  General: Well nourished, well developed, in no acute distress Head: Atraumatic, normal size  Eyes: PEERLA, EOMI  Neck: Supple, no JVD Endocrine: No thryomegaly Cardiac: Normal S1, S2; irregular rhythm, no murmurs Lungs: Clear to auscultation bilaterally, no wheezing, rhonchi or rales  Abd: Soft, nontender, no hepatomegaly  Ext: No edema, pulses 2+ Musculoskeletal: No deformities, BUE and BLE strength normal and equal Skin: Warm and dry, no rashes   Neuro: Alert and oriented to person, place, time, and situation, CNII-XII grossly intact, no focal deficits  Psych: Normal mood and affect   EKG:  The EKG was personally reviewed and demonstrates: Atrial  flutter with 3-1 conduction, deep anterior lateral T wave inversions noted Telemetry:  Telemetry was personally reviewed and demonstrates:  Aflutter 80s  Relevant CV Studies: TTE 04/04/2021  1. Left ventricular ejection fraction, by estimation, is 50 to 55%. The  left ventricle has low  normal function. The left ventricle demonstrates  global hypokinesis. The left ventricular internal cavity size was mildly  dilated. There is severe left  ventricular hypertrophy. Left ventricular diastolic parameters are  indeterminate. The average left ventricular global longitudinal strain is  -19.1 %. The global longitudinal strain is normal.   2. Right ventricular systolic function is normal. The right ventricular  size is normal. There is moderately elevated pulmonary artery systolic  pressure.   3. Left atrial size was moderately dilated.   4. Right atrial size was moderately dilated.   5. The mitral valve is abnormal. Trivial mitral valve regurgitation. No  evidence of mitral stenosis.   6. Tricuspid valve regurgitation is moderate.   7. The aortic valve is tricuspid. There is moderate calcification of the  aortic valve. Aortic valve regurgitation is not visualized. Mild to  moderate aortic valve sclerosis/calcification is present, without any  evidence of aortic stenosis.   8. Aortic dilatation noted. There is moderate dilatation of the ascending  aorta, measuring 45 mm.   9. The inferior vena cava is normal in size with greater than 50%  respiratory variability, suggesting right atrial pressure of 3 mmHg.   LHC 03/29/2018 Conclusions: Mild, non-obstructive coronary artery disease. Normal left ventricular systolic function with upper normal left ventricular filling pressure.  Laboratory Data: High Sensitivity Troponin:  No results for input(s): "TROPONINIHS" in the last 720 hours.   Cardiac EnzymesNo results for input(s): "TROPONINI" in the last 168 hours. No results for input(s): "TROPIPOC" in the last 168 hours.  Chemistry Recent Labs  Lab 07/04/22 1642 07/05/22 0802 07/07/22 0306  NA 136 137 136  K 4.8 5.1 4.2  CL 97* 98 96*  CO2 '28 25 30  '$ GLUCOSE 107* 95 117*  BUN 48* 58* 43*  CREATININE 8.56* 10.31* 9.65*  CALCIUM 9.1 9.3 9.2  GFRNONAA 6* 5* 5*  ANIONGAP '11  14 10    '$ Recent Labs  Lab 07/03/22 1930 07/04/22 0052  PROT 8.1 7.0  ALBUMIN 3.5 3.2*  AST 56* 36  ALT 45* 40  ALKPHOS 104 93  BILITOT 1.8* 1.7*   Hematology Recent Labs  Lab 07/04/22 0052 07/05/22 0804 07/07/22 0306  WBC 4.3 3.7* 3.5*  RBC 3.51* 3.41* 3.61*  HGB 10.4* 10.4* 11.0*  HCT 33.3* 32.1* 34.2*  MCV 94.9 94.1 94.7  MCH 29.6 30.5 30.5  MCHC 31.2 32.4 32.2  RDW 17.8* 17.9* 17.7*  PLT 76* 109* 127*   BNPNo results for input(s): "BNP", "PROBNP" in the last 168 hours.  DDimer No results for input(s): "DDIMER" in the last 168 hours.  Radiology/Studies:  DG Chest 2 View  Result Date: 07/03/2022 CLINICAL DATA:  Shortness of breath, fatigue. EXAM: CHEST - 2 VIEW COMPARISON:  12/28/2021. FINDINGS: Heart is enlarged and the mediastinal contour is stable. Atherosclerotic calcification of the aorta is noted. Minimal subsegmental atelectasis is noted at the left lung base. No effusion or pneumothorax. A vascular stent is noted in the left upper extremity. No acute osseous abnormality. IMPRESSION: 1. No active cardiopulmonary disease. 2. Cardiomegaly. Electronically Signed   By: Brett Fairy M.D.   On: 07/03/2022 20:11    Assessment and Plan:   # Abnormal EKG with new anterolateral T wave  inversions -Admitted with hyperkalemia and missed dialysis sessions.  This is a recurring thing for him.  He often misses dialysis and has had several admissions this year for this. -EKG after dialysis session shows new T wave inversions.  He reports no chest pain.  He does have an echo in the past with severe LVH and his echo is pending. -His T wave inversions are either related to possible stress-induced cardiomyopathy, apical variant hypertrophic cardiomyopathy or T waves consistent with possible acute stroke.  He has no focal deficits to suggest acute stroke.  His EKGs in the past have not shown this pattern.  It is unlikely for him to have apical variant HCM.  I do not believe this  represents ischemia given his lack of chest pain symptoms.  This could simply just be possible stress-induced pattern.  We will check an echocardiogram to determine what is going on here.  It is reassuring that he is resting comfortably without symptoms. -Left heart catheterization in 2019 showed nonobstructive CAD.  # Persistent atrial fibrillation/flutter -Not on anticoagulation due to history of severe GI bleed.  Records are rather difficult to find details on this. -Will continue with holding his Eliquis at this time. -I have stopped his amiodarone.  Really of no benefit in the setting of what appears to be almost permanent atrial fibrillation.  Continue carvedilol 25 mg twice daily for rate control.  # PAD status post right common and external iliac artery stenting 06/15/2020 # Nonobstructive CAD -No current issues.  -Would recommend to start aspirin and Lipitor.  # ESRD -Per nephrology.  Frequently misses sessions of dialysis.  Compliance is questionable.  # Hypertension -Continue carvedilol 25 mg twice daily.  # Substance abuse -Has issues with cocaine and alcohol abuse.  Cessation advised.  # COVID-19 infection -Per hospital medicine  # C. difficile colitis -Oral vancomycin  For questions or updates, please contact [place] Please consult [email or web address] for contact info under   Signed, [place] T. Audie Box, MD, [place]  07/07/2022 3:37 PM

## 2022-07-07 NOTE — Progress Notes (Signed)
Physical Therapy Treatment Patient Details Name: Raymond [name] MRN: [id number] DOB: [birth year] Today's Date: 07/07/2022   History of Present Illness Pt is a 66 y/o M presenting to ED on 12/24 with flu like symptoms x3 days and hypoglycemia. Found to have COVID-19 and C-diff, admitted for hyperkalemia. PMH includes ESRD on HD TTS, aparoxysmal A fib not on AC, HTN, HFrEF, PAD, COPD.    PT Comments    Pt was seen for mobility on RW for steps and generally with gait, and note his mild awkwardness but also in a fairly confined space.  Pt is demonstrating enough control of gait to be safe in home environment, but would recommend supervision in that setting initially for stairs.  Follow along with him for progression of distances and challenges of balance as his precautions permit.  Pt is motivated to work and should make good progress.  Follow along with him for goals of acute PT.  Recommendations for follow up therapy are one component of a multi-disciplinary discharge planning process, led by the attending physician.  Recommendations may be updated based on patient status, additional functional criteria and insurance authorization.  Follow Up Recommendations  Home health PT     Assistance Recommended at Discharge Intermittent Supervision/Assistance  Patient can return home with the following A little help with walking and/or transfers;A little help with bathing/dressing/bathroom;Assistance with cooking/housework;Assist for transportation;Help with stairs or ramp for entrance   Equipment Recommendations  Rolling walker (2 wheels)    Recommendations for Other Services       Precautions / Restrictions Precautions Precautions: Fall Precaution Comments: monitor sats and HR Restrictions Weight Bearing Restrictions: No     Mobility  Bed Mobility Overal bed mobility: Needs Assistance Bed Mobility: Supine to Sit, Sit to Supine     Supine to sit: Supervision Sit to supine: Supervision         Transfers Overall transfer level: Needs assistance Equipment used: Rolling walker (2 wheels) Transfers: Sit to/from Stand Sit to Stand: Supervision                Ambulation/Gait Ambulation/Gait assistance: Min guard Gait Distance (Feet): 10 Feet Assistive device: Rolling walker (2 wheels), 1 person hand held assist   Gait velocity: reduced Gait velocity interpretation: <1.31 ft/sec, indicative of household ambulator       Stairs Stairs: Yes Stairs assistance: Supervision, Min guard Stair Management: With walker, Step to pattern, Forwards Number of Stairs: 4 General stair comments: pt is more controlled with balance today, using new walker for home   Wheelchair Mobility    Modified Rankin (Stroke Patients Only)       Balance Overall balance assessment: Needs assistance Sitting-balance support: Feet supported Sitting balance-Leahy Scale: Good       Standing balance-Leahy Scale: Fair Standing balance comment: less than fair dynamically                            Cognition Arousal/Alertness: Awake/alert Behavior During Therapy: WFL for tasks assessed/performed Overall Cognitive Status: Within Functional Limits for tasks assessed                                          Exercises      General Comments General comments (skin integrity, edema, etc.): pt is more stable with walker but also needs supervision for stairs.  Have noted this  to dc planning to have help for him at his home setting      Pertinent Vitals/Pain Pain Assessment Pain Assessment: No/denies pain    Home Living                          Prior Function            PT Goals (current goals can now be found in the care plan section) Acute Rehab PT Goals Patient Stated Goal: to walk alone and get home Progress towards PT goals: Progressing toward goals    Frequency    Min 3X/week      PT Plan Current plan remains appropriate     Co-evaluation              AM-PAC PT "6 Clicks" Mobility   Outcome Measure  Help needed turning from your back to your side while in a flat bed without using bedrails?: None Help needed moving from lying on your back to sitting on the side of a flat bed without using bedrails?: A Little Help needed moving to and from a bed to a chair (including a wheelchair)?: A Little Help needed standing up from a chair using your arms (e.g., wheelchair or bedside chair)?: A Little Help needed to walk in hospital room?: A Little Help needed climbing 3-5 steps with a railing? : A Little 6 Click Score: 19    End of Session Equipment Utilized During Treatment: Gait belt Activity Tolerance: Patient limited by fatigue;Patient limited by pain Patient left: in bed;with call bell/phone within reach;with bed alarm set Nurse Communication: Mobility status PT Visit Diagnosis: Unsteadiness on feet (R26.81);Muscle weakness (generalized) (M62.81);Pain Pain - Right/Left: Right Pain - part of body: Ankle and joints of foot     Time: 1430-1447 PT Time Calculation (min) (ACUTE ONLY): 17 min  Charges:  $Gait Training: 8-22 mins      Ramond Dial 07/07/2022, 4:33 PM  Mee Hives, PT PhD Acute Rehab Dept. Number: [place]

## 2022-07-07 NOTE — Progress Notes (Signed)
  Echocardiogram 2D Echocardiogram has been performed.  Wynelle Link 07/07/2022, 5:08 PM

## 2022-07-07 NOTE — Progress Notes (Addendum)
Received patient in bed.  Alert and oriented.  Informed consent signed and in chart.   Treatment initiated: 0752 Treatment completed: 1130  Patient tolerated well.  Alert, without acute distress.  Hand-off given to patient's nurse.   Access used: L AVF Access issues: None  Medication(s) given: Heparin 2000 units mid-run. Post HD weight: 62.6kg (standing)   [facility]   07/07/22 1130  Vitals  Temp 97.7 F (36.5 C)  Temp Source Oral  BP 124/87  MAP (mmHg) 100  Pulse Rate 79  Resp 18  Oxygen Therapy  SpO2 100 %  O2 Device Room Air  During Treatment Monitoring  Intra-Hemodialysis Comments Tx completed;Tolerated well  Post Treatment  Dialyzer Clearance Lightly streaked  Duration of HD Treatment -hour(s) 3 hour(s)  Liters Processed 66.1  Fluid Removed (mL) 2400 mL  Tolerated HD Treatment Yes  AVG/AVF Arterial Site Held (minutes) 8 minutes  AVG/AVF Venous Site Held (minutes) 8 minutes  Fistula / Graft Left Forearm Arteriovenous fistula  Placement Date/Time: 01/26/21 0104   Placed prior to admission: Yes  Orientation: Left  Access Location: Forearm  Access Type: Arteriovenous fistula  Site Condition No complications  Fistula / Graft Assessment Present;Thrill;Bruit  Status Deaccessed  Drainage Description None

## 2022-07-07 NOTE — Discharge Summary (Incomplete)
Physician Discharge Summary   Patient: Raymond [name] MRN: [id number] DOB: [birth year]  Admit date:     07/03/2022  Discharge date: 07/08/22  Discharge Physician: Cordelia Poche, MD   PCP: [name], FNP   Recommendations at discharge:  PCP follow-up Cardiology follow-up  Discharge Diagnoses: Principal Problem:   Hyperkalemia Active Problems:   ESRD (end stage renal disease) ([place])   Type 2 diabetes mellitus with other diabetic kidney complication ([place])   Hypertension   COVID-19 virus infection   Clostridium difficile diarrhea   Paroxysmal atrial fibrillation (H[facility])   COPD (chronic obstructive pulmonary disease) ([place])   Thrombocytopenia, unspecified ([place])  Resolved Problems:   * No resolved hospital problems. *  Hospital Course: Raymond [name] is a 66 y.o. male with a history of ESRD on HD, paroxysmal atrial fibrillation/atrial flutter not on anticoagulation, hypertension, chronic systolic heart failure, PAD, COPD, secondary hyperparathyroidism. Patient presented secondary to weakness and found to have hyperkalemia with hypoglycemia. Hypoglycemia managed with dextrose IV fluids and hyperkalemia managed with [place]/HD. Prior to discharge, patient developed significant EKG changes in anterolateral distribution. Cardiology consulted and recommended medical management. MRI brain ordered and ruled out stroke. Patient discharged to continue Vancomycin for treatment of C. Difficile.  Assessment and Plan:  Hyperkalemia Secondary to missed hemodialysis. Resolved with resumption of HD.   ESRD on HD Nephrology consulted for hemodialysis.   Abnormal EKG Patient developed acute changes in telemetry during hemodialysis. EKG post-HD confirms anterolateral T-wave inversions. Patient without chest pain. Transthoracic Echocardiogram significant for  -Transthoracic Echocardiogram -Cardiology consult  Chronic systolic heart failure Newly diagnosed. LVEF of 45-50% with associated global  hypokinesis and asymmetric ventricular hypertrophy of the inferior segment.   Hypoglycemia Possible related to acute infection. Resolved with IV dextrose infusion which has been discontinued.   Diabetes mellitus type 2 Uncontrolled with hypoglycemia. Hemoglobin A1C of 4.6% from 09/2021.   Primary hypertension -Continue Coreg   COVID-19 infection Asymptomatic on admission with symptoms subsiding prior to admission. No evidence of pneumonia.   Clostridium difficile infection Antigen and PCR positive with negative toxin. Patient started on Vancomycin PO -Continue Vancomycin PO   Paroxysmal atrial fibrillation Atrial flutter -Continue Coreg 25 mg BID   COPD Stable.   Elevated LFTs In setting of viral infection. Outpatient follow-up.   Metabolic acidosis Likely related to ESRD and missed HD. Resolved.   Anemia of chronic disease Hemoglobin stable.   Thrombocytopenia Likely reactive and secondary to infection. Improving.   Consultants: Cardiology, Nephrology Procedures performed: 12/28: Transthoracic Echocardiogram   Disposition: Home Diet recommendation: Renal diet   DISCHARGE MEDICATION: Allergies as of 07/08/2022   No Known Allergies      Medication List     STOP taking these medications    amiodarone 200 MG tablet Commonly known as: PACERONE   amLODipine 5 MG tablet Commonly known as: NORVASC       TAKE these medications    albuterol 108 (90 Base) MCG/ACT inhaler Commonly known as: VENTOLIN HFA Inhale 3 puffs into the lungs daily as needed for shortness of breath or wheezing.   Aspirin Low Dose 81 MG chewable tablet Generic drug: aspirin Chew 1 tablet (81 mg total) by mouth daily. Start taking on: July 09, 2022   atorvastatin 40 MG tablet Commonly known as: LIPITOR Take 1 tablet (40 mg total) by mouth daily. Start taking on: July 09, 2022   Breo Ellipta 200-25 MCG/ACT Aepb Generic drug: fluticasone furoate-vilanterol Inhale 2  puffs into the lungs daily as  needed (for shortness of breath).   calcitRIOL 0.5 MCG capsule Commonly known as: ROCALTROL Take 1 capsule (0.5 mcg total) by mouth Every Tuesday,Thursday,and Saturday with dialysis.   carvedilol 12.5 MG tablet Commonly known as: COREG Take 2 tablets (25 mg total) by mouth every morning AND 1 tablet (12.5 mg total) every evening.   cinacalcet 30 MG tablet Commonly known as: SENSIPAR Take 6 tablets (180 mg total) by mouth Every Tuesday,Thursday,and Saturday with dialysis.   Darbepoetin Alfa 200 MCG/0.4ML Sosy injection Commonly known as: ARANESP Inject 0.4 mLs (200 mcg total) into the vein every Tuesday with hemodialysis.   hydrALAZINE 10 MG tablet Commonly known as: APRESOLINE Take 1 tablet (10 mg total) by mouth every 8 (eight) hours.   isosorbide mononitrate 30 MG 24 hr tablet Commonly known as: IMDUR Take 1/2 tablet (15 mg total) by mouth daily. Start taking on: July 09, 2022   multivitamin Tabs tablet Take 1 tablet by mouth at bedtime.   pantoprazole 40 MG tablet Commonly known as: PROTONIX Take 1 tablet (40 mg total) by mouth 2 (two) times daily before a meal. What changed: when to take this   sertraline 25 MG tablet Commonly known as: ZOLOFT Take 25 mg by mouth daily.   sevelamer carbonate 800 MG tablet Commonly known as: RENVELA Take 5 tablets (4,000 mg total) by mouth 3 (three) times daily with meals.   vancomycin 125 MG capsule Commonly known as: VANCOCIN Take 1 capsule (125 mg total) by mouth 4 (four) times daily for 6 days.               Durable Medical Equipment  (From admission, onward)           Start     Ordered   07/07/22 1259  For home use only DME Walker rolling  Once       Question Answer Comment  Walker: With 5 Inch Wheels   Patient needs a walker to treat with the following condition Weakness      07/07/22 1259            Discharge Exam: BP 116/79 (BP Location: Right Arm)   Pulse 80    Temp (!) 97.5 F (36.4 C) (Oral)   Resp 18   Wt 62.6 kg   SpO2 100%   BMI 21.00 kg/m   General exam: Appears calm and comfortable Respiratory system: Respiratory effort normal. Gastrointestinal system: Abdomen is non-distended Central nervous system: Alert and oriented. No focal neurological deficits. Psychiatry: Judgement and insight appear normal. Mood & affect appropriate.   Condition at discharge: stable  The results of significant diagnostics from this hospitalization (including imaging, microbiology, ancillary and laboratory) are listed below for reference.   Imaging Studies: DG Chest 2 View  Result Date: 07/03/2022 CLINICAL DATA:  Shortness of breath, fatigue. EXAM: CHEST - 2 VIEW COMPARISON:  12/28/2021. FINDINGS: Heart is enlarged and the mediastinal contour is stable. Atherosclerotic calcification of the aorta is noted. Minimal subsegmental atelectasis is noted at the left lung base. No effusion or pneumothorax. A vascular stent is noted in the left upper extremity. No acute osseous abnormality. IMPRESSION: 1. No active cardiopulmonary disease. 2. Cardiomegaly. Electronically Signed   By: Brett Fairy M.D.   On: 07/03/2022 20:11    Microbiology: Results for orders placed or performed during the hospital encounter of 07/03/22  Resp panel by RT-PCR (RSV, Flu A&B, Covid) Anterior Nasal Swab     Status: Abnormal   Collection Time: 07/03/22  8:16 PM  Specimen: Anterior Nasal Swab  Result Value Ref Range Status   SARS Coronavirus 2 by RT PCR POSITIVE (A) NEGATIVE Final    Comment: (NOTE) SARS-CoV-2 target nucleic acids are DETECTED.  The SARS-CoV-2 RNA is generally detectable in upper respiratory specimens during the acute phase of infection. Positive results are indicative of the presence of the identified virus, but do not rule out bacterial infection or co-infection with other pathogens not detected by the test. Clinical correlation with patient history and other  diagnostic information is necessary to determine patient infection status. The expected result is Negative.  Fact Sheet for Patients: EntrepreneurPulse.com.au  Fact Sheet for Healthcare Providers: IncredibleEmployment.be  This test is not yet approved or cleared by the [place] FDA and  has been authorized for detection and/or diagnosis of SARS-CoV-2 by FDA under an Emergency Use Authorization (EUA).  This EUA will remain in effect (meaning this test can be used) for the duration of  the COVID-19 declaration under Section 564(b)(1) of the A ct, 21 U.S.C. section 360bbb-3(b)(1), unless the authorization is terminated or revoked sooner.     Influenza A by PCR NEGATIVE NEGATIVE Final   Influenza B by PCR NEGATIVE NEGATIVE Final    Comment: (NOTE) The Xpert Xpress SARS-CoV-2/FLU/RSV plus assay is intended as an aid in the diagnosis of influenza from Nasopharyngeal swab specimens and should not be used as a sole basis for treatment. Nasal washings and aspirates are unacceptable for Xpert Xpress SARS-CoV-2/FLU/RSV testing.  Fact Sheet for Patients: EntrepreneurPulse.com.au  Fact Sheet for Healthcare Providers: IncredibleEmployment.be  This test is not yet approved or cleared by the [place] FDA and has been authorized for detection and/or diagnosis of SARS-CoV-2 by FDA under an Emergency Use Authorization (EUA). This EUA will remain in effect (meaning this test can be used) for the duration of the COVID-19 declaration under Section 564(b)(1) of the Act, 21 U.S.C. section 360bbb-3(b)(1), unless the authorization is terminated or revoked.     Resp Syncytial Virus by PCR NEGATIVE NEGATIVE Final    Comment: (NOTE) Fact Sheet for Patients: EntrepreneurPulse.com.au  Fact Sheet for Healthcare Providers: IncredibleEmployment.be  This test is not yet approved or  cleared by the [place] FDA and has been authorized for detection and/or diagnosis of SARS-CoV-2 by FDA under an Emergency Use Authorization (EUA). This EUA will remain in effect (meaning this test can be used) for the duration of the COVID-19 declaration under Section 564(b)(1) of the Act, 21 U.S.C. section 360bbb-3(b)(1), unless the authorization is terminated or revoked.  Performed at [facility], [place] [address] [place], [place] [address]   C Difficile Quick Screen w PCR reflex     Status: Abnormal   Collection Time: 07/03/22 10:27 PM   Specimen: STOOL  Result Value Ref Range Status   C Diff antigen POSITIVE (A) NEGATIVE Final   C Diff toxin NEGATIVE NEGATIVE Final   C Diff interpretation Results are indeterminate. See PCR results.  Final    Comment: Performed at [facility], [place] [address], [place],  [address]  C. Diff by PCR, Reflexed     Status: Abnormal   Collection Time: 07/03/22 10:27 PM  Result Value Ref Range Status   Toxigenic C. Difficile by PCR POSITIVE (A) NEGATIVE Final    Comment: Positive for toxigenic C. difficile with little to no toxin production. Only treat if clinical presentation suggests symptomatic illness. Performed at [facility], [place] [address], [place],  [address]  Labs: CBC: Recent Labs  Lab 07/03/22 1930 07/03/22 2021 07/03/22 2022 07/04/22 0052 07/05/22 0804 07/07/22 0306  WBC 4.9  --   --  4.3 3.7* 3.5*  NEUTROABS 3.8  --   --   --   --  1.7  HGB 11.2* 11.6* 11.6* 10.4* 10.4* 11.0*  HCT 35.3* 34.0* 34.0* 33.3* 32.1* 34.2*  MCV 93.9  --   --  94.9 94.1 94.7  PLT 85*  --   --  76* 109* 977*   Basic Metabolic Panel: Recent Labs  Lab 07/03/22 1919 07/03/22 2021 07/03/22 2022 07/04/22 0052 07/04/22 1642 07/05/22 0802 07/05/22 0804 07/07/22 0306  NA 140   < > 135 140 136 137  --  136  K >7.5*   < > 7.3* 6.3* 4.8 5.1  --  4.2  CL 97*  --  103 96* 97* 98  --  96*  CO2 21*  --   --  '23 28  25  '$ --  30  GLUCOSE 89  --  88 48* 107* 95  --  117*  BUN 116*  --  118* 120* 48* 58*  --  43*  CREATININE 15.83*  --  17.10* 16.32* 8.56* 10.31*  --  9.65*  CALCIUM 8.0*  --   --  8.2* 9.1 9.3  --  9.2  PHOS  --   --   --   --   --   --  6.6*  --    < > = values in this interval not displayed.   Liver Function Tests: Recent Labs  Lab 07/03/22 1930 07/04/22 0052  AST 56* 36  ALT 45* 40  ALKPHOS 104 93  BILITOT 1.8* 1.7*  PROT 8.1 7.0  ALBUMIN 3.5 3.2*   CBG: Recent Labs  Lab 07/06/22 1130 07/06/22 1642 07/06/22 2030 07/07/22 0041 07/07/22 0448  GLUCAP 86 101* 99 99 90    Discharge time spent: 35 minutes.  Signed: Cordelia Poche, MD [facility] 07/07/2022

## 2022-07-07 NOTE — Progress Notes (Signed)
[facility] Progress Note   Subjective: Getting HD in room, isolation for COVID 19. Clotted system-added tight heparin. Attempting UF goal 2.5 liters.      Objective Vitals:   07/07/22 0830 07/07/22 0900 07/07/22 0930 07/07/22 1000  BP: 128/71 119/71 (!) 144/91 122/80  Pulse: 75 92 78 77  Resp: '18 18 18 18  '$ Temp:      TempSrc:      SpO2: 95% 97% 100% 99%  Weight:       Physical Exam  Deferred D/T Covid positive status   Additional Objective Labs: Basic Metabolic Panel: Recent Labs  Lab 07/04/22 1642 07/05/22 0802 07/05/22 0804 07/07/22 0306  NA 136 137  --  136  K 4.8 5.1  --  4.2  CL 97* 98  --  96*  CO2 28 25  --  30  GLUCOSE 107* 95  --  117*  BUN 48* 58*  --  43*  CREATININE 8.56* 10.31*  --  9.65*  CALCIUM 9.1 9.3  --  9.2  PHOS  --   --  6.6*  --    Liver Function Tests: Recent Labs  Lab 07/03/22 1930 07/04/22 0052  AST 56* 36  ALT 45* 40  ALKPHOS 104 93  BILITOT 1.8* 1.7*  PROT 8.1 7.0  ALBUMIN 3.5 3.2*   No results for input(s): "LIPASE", "AMYLASE" in the last 168 hours. CBC: Recent Labs  Lab 07/03/22 1930 07/03/22 2021 07/04/22 0052 07/05/22 0804 07/07/22 0306  WBC 4.9  --  4.3 3.7* 3.5*  NEUTROABS 3.8  --   --   --  1.7  HGB 11.2*   < > 10.4* 10.4* 11.0*  HCT 35.3*   < > 33.3* 32.1* 34.2*  MCV 93.9  --  94.9 94.1 94.7  PLT 85*  --  76* 109* 127*   < > = values in this interval not displayed.   Blood Culture    Component Value Date/Time   SDES BLOOD RIGHT HAND 11/14/2021 1616   SDES BLOOD RIGHT WRIST 11/14/2021 1616   SPECREQUEST  11/14/2021 1616    BOTTLES DRAWN AEROBIC ONLY Blood Culture results may not be optimal due to an inadequate volume of blood received in culture bottles   SPECREQUEST  11/14/2021 1616    BOTTLES DRAWN AEROBIC AND ANAEROBIC Blood Culture results may not be optimal due to an inadequate volume of blood received in culture bottles   CULT  11/14/2021 1616    NO GROWTH 5 DAYS Performed at  [facility], [place] [address], [place], [place] [address]    CULT  11/14/2021 1616    NO GROWTH 5 DAYS Performed at [facility] [address], [place], [place] [address]    REPTSTATUS 11/19/2021 FINAL 11/14/2021 1616   REPTSTATUS 11/19/2021 FINAL 11/14/2021 1616    Cardiac Enzymes: No results for input(s): "CKTOTAL", "CKMB", "CKMBINDEX", "TROPONINI" in the last 168 hours. CBG: Recent Labs  Lab 07/06/22 1642 07/06/22 2030 07/07/22 0041 07/07/22 0448 07/07/22 0820  GLUCAP 101* 99 99 90 130*   Iron Studies: No results for input(s): "IRON", "TIBC", "TRANSFERRIN", "FERRITIN" in the last 72 hours. '@lablastinr3'$ @ Studies/Results: No results found. Medications:  sodium chloride     anticoagulant sodium citrate      amiodarone  200 mg Oral Daily   calcitRIOL  0.75 mcg Oral Q T,Th,Sa-HD   carvedilol  12.5 mg Oral Q supper   carvedilol  25 mg Oral Q breakfast   Chlorhexidine Gluconate  Cloth  6 each Topical Q0600   cinacalcet  60 mg Oral Q T,Th,Sa-HD   glucose  4 tablet Oral Once   multivitamin  1 tablet Oral QHS   pantoprazole  40 mg Oral Daily   sertraline  25 mg Oral Daily   sevelamer carbonate  4,000 mg Oral TID WC   sodium chloride flush  3 mL Intravenous Q12H   vancomycin  125 mg Oral QID     OP HD: TTS [facility] 4h  400/1.5  68.9kg  2/2 bath  LFA AVF  Hep none - last HD 12/19, post wt 71.1kg  - venofer 50  IVq wk - rocaltrol 0.75 mcg po tiw - sensipar 60 mg po tiw - mircera 200 MCG IV q2, last 12/7, due 12/21      CXR - no active disease   Assessment/ Plan: Severe hyperkalemia in setting of missed HD - Resolved with HD  + C diff-oral vanc per primary  + COVID-19 per primary PAF-rate controlled. Continue carvedilol. Not on AC.  ESRD - on HD TTS. Hx of missed HD. Next HD 07/09/2022.  HTN/ vol - Under EDW with soft BP. Hold Amlodipine, continue carvedilol for rate control. Lower EDW on discharge.  MBD ckd - CCa in range, add on phos. Cont renvela as  binder and po vdra w/ HD.  Anemia esrd - HGB 11.0 Holding ESA D/T high HGB. Follow trends.    Anaiya Wisinski H. Nancey Kreitz NP-C 07/07/2022, 10:03 AM  [employer] [phone]

## 2022-07-07 NOTE — TOC Transition Note (Signed)
Transition of Care [facility]) - CM/SW Discharge Note   Patient Details  Name: Raymond [name] MRN: [id number] Date of Birth: 21-Jul-1955  Transition of Care [facility]) CM/SW Contact:  Cyndi Bender, RN Phone Number: 07/07/2022, 2:29 PM   Clinical Narrative:    Patient stable for discharge.  Spoke to patient regarding transition needs.  Patient declines home health. Patient would like walker to be delivered to the room prior to discharge. [name] with [employer] aware of walker order.  No other TOC needs at this time.   Final next level of care: Home/Self Care Barriers to Discharge: Barriers Resolved   Patient Goals and CMS Choice    Return to Alcohol Rehab  Discharge Placement    Alcohol Rehab                     Discharge Plan and Services Additional resources added to the After Visit Summary for                  DME Arranged: Walker rolling DME Agency: [employer] Date DME Agency Contacted: 07/07/22 Time DME Agency Contacted: 1300 Representative spoke with at DME Agency: [place] ([place]) Interventions [place]: No Food Insecurity (12/29/2021)  Housing: Low Risk  (12/29/2021)  Transportation Needs: No Transportation Needs (12/29/2021)  Financial Resource Strain: Low Risk  (12/29/2021)  Social Connections: Unknown (08/18/2018)  Tobacco Use: High Risk (01/10/2022)     Readmission Risk Interventions    07/07/2022    1:00 PM 12/01/2021   10:21 AM 11/26/2021    5:07 PM  Readmission Risk Prevention Plan  Transportation Screening Complete  Complete  Medication Review Press photographer) Complete  Complete  PCP or Specialist appointment within 3-5 days of discharge Complete Complete   HRI or [place] Complete Complete   SW Recovery Care/Counseling Consult Complete  Complete  Palliative Care Screening Not Applicable  Not [place] Not Applicable Not Applicable

## 2022-07-08 ENCOUNTER — Other Ambulatory Visit (HOSPITAL_COMMUNITY): Payer: Self-pay

## 2022-07-08 ENCOUNTER — Inpatient Hospital Stay (HOSPITAL_COMMUNITY): Payer: Medicare Other

## 2022-07-08 ENCOUNTER — Other Ambulatory Visit: Payer: Self-pay

## 2022-07-08 DIAGNOSIS — I480 Paroxysmal atrial fibrillation: Secondary | ICD-10-CM | POA: Diagnosis not present

## 2022-07-08 DIAGNOSIS — N186 End stage renal disease: Secondary | ICD-10-CM | POA: Diagnosis not present

## 2022-07-08 DIAGNOSIS — R9431 Abnormal electrocardiogram [ECG] [EKG]: Secondary | ICD-10-CM | POA: Diagnosis not present

## 2022-07-08 LAB — GLUCOSE, CAPILLARY
Glucose-Capillary: 103 mg/dL — ABNORMAL HIGH (ref 70–99)
Glucose-Capillary: 107 mg/dL — ABNORMAL HIGH (ref 70–99)

## 2022-07-08 LAB — HEPATITIS B SURFACE ANTIBODY, QUANTITATIVE: Hep B S AB Quant (Post): >1000 m[IU]/mL

## 2022-07-08 MED ORDER — ATORVASTATIN CALCIUM 40 MG PO TABS
40.0000 mg | ORAL_TABLET | Freq: Every day | ORAL | 2 refills | Status: DC
  Filled 2022-07-08: qty 30, 30d supply, fill #0

## 2022-07-08 MED ORDER — ASPIRIN 81 MG PO CHEW
81.0000 mg | CHEWABLE_TABLET | Freq: Every day | ORAL | 2 refills | Status: DC
  Filled 2022-07-08: qty 30, 30d supply, fill #0

## 2022-07-08 MED ORDER — HYDRALAZINE HCL 10 MG PO TABS
10.0000 mg | ORAL_TABLET | Freq: Three times a day (TID) | ORAL | 2 refills | Status: DC
  Filled 2022-07-08: qty 90, 30d supply, fill #0

## 2022-07-08 MED ORDER — VANCOMYCIN HCL 125 MG PO CAPS
125.0000 mg | ORAL_CAPSULE | Freq: Four times a day (QID) | ORAL | 0 refills | Status: AC
  Filled 2022-07-08: qty 24, 6d supply, fill #0

## 2022-07-08 MED ORDER — ISOSORBIDE MONONITRATE ER 30 MG PO TB24
15.0000 mg | ORAL_TABLET | Freq: Every day | ORAL | 2 refills | Status: DC
  Filled 2022-07-08: qty 15, 30d supply, fill #0

## 2022-07-08 MED ORDER — ISOSORBIDE MONONITRATE ER 30 MG PO TB24
15.0000 mg | ORAL_TABLET | Freq: Every day | ORAL | Status: DC
  Administered 2022-07-08: 15 mg via ORAL
  Filled 2022-07-08: qty 1

## 2022-07-08 MED ORDER — CHLORHEXIDINE GLUCONATE CLOTH 2 % EX PADS
6.0000 | MEDICATED_PAD | Freq: Every day | CUTANEOUS | Status: DC
  Administered 2022-07-08: 6 via TOPICAL

## 2022-07-08 MED ORDER — HYDRALAZINE HCL 10 MG PO TABS
10.0000 mg | ORAL_TABLET | Freq: Three times a day (TID) | ORAL | Status: DC
  Administered 2022-07-08 (×2): 10 mg via ORAL
  Filled 2022-07-08 (×2): qty 1

## 2022-07-08 NOTE — Progress Notes (Signed)
Heart Failure Navigator Progress Note  Assessed for Heart & Vascular TOC clinic readiness.  Patient does not meet criteria due to Hemodialysis.   Navigator will sign off at this time   Earnestine Leys, Copywriter, advertising, RN Heart Failure Leisure centre manager Chat Only

## 2022-07-08 NOTE — Discharge Instructions (Addendum)
Raymond [name],  You were in the hospital because of a GI infection caused by C. Difficile. This has been treated with antibiotics. You will need to continue on discharge. While you were here, you had abnormal changes to your heart rhythm (EKG). The cardiologist evaluated you and recommended some medication changes. They would like for you to follow-up in their office.

## 2022-07-08 NOTE — Progress Notes (Signed)
Advised by staff that pt is for likely d/c today. Contacted [facility] and spoke to Agricultural consultant. Clinic advised pt will likely d/c today and resume care tomorrow. Clinic aware pt is covid and cdiff positive. There will be no schedule change per clinic.   Melven Sartorius Renal Navigator [phone]

## 2022-07-08 NOTE — Progress Notes (Signed)
Cardiology Progress Note  Patient ID: Raymond [name] MRN: [id number] DOB: 1955-07-20 Date of Encounter: 07/08/2022  Primary Cardiologist: Raymond Rouge, MD  Subjective   Chief Complaint: None.   HPI: Denies CP or SOB. Echo with mildly reduced EF. No symptoms.   ROS:  All other ROS reviewed and negative. Pertinent positives noted in the HPI.     Inpatient Medications  Scheduled Meds:  aspirin  81 mg Oral Daily   atorvastatin  40 mg Oral Daily   calcitRIOL  0.75 mcg Oral Q T,Th,Sa-HD   carvedilol  25 mg Oral BID WC   Chlorhexidine Gluconate Cloth  6 each Topical Q0600   Chlorhexidine Gluconate Cloth  6 each Topical Q0600   cinacalcet  60 mg Oral Q T,Th,Sa-HD   feeding supplement (NEPRO CARB STEADY)  237 mL Oral BID BM   glucose  4 tablet Oral Once   hydrALAZINE  10 mg Oral Q8H   isosorbide mononitrate  15 mg Oral Daily   multivitamin  1 tablet Oral QHS   pantoprazole  40 mg Oral Daily   sertraline  25 mg Oral Daily   sevelamer carbonate  4,000 mg Oral TID WC   sodium chloride flush  3 mL Intravenous Q12H   vancomycin  125 mg Oral QID   Continuous Infusions:  sodium chloride     anticoagulant sodium citrate     PRN Meds: sodium chloride, alteplase, anticoagulant sodium citrate, dextrose, heparin, lidocaine (PF), lidocaine-prilocaine, pentafluoroprop-tetrafluoroeth, sodium chloride flush   Vital Signs   Vitals:   07/07/22 1700 07/07/22 2039 07/08/22 0636 07/08/22 0953  BP: 133/80 110/78 (!) 126/93 116/79  Pulse: 81 78 70 80  Resp: 18 18    Temp: 97.8 F (36.6 C) 98.4 F (36.9 C)  (!) 97.5 F (36.4 C)  TempSrc: Oral Oral  Oral  SpO2: 100% 97%  100%  Weight:        Intake/Output Summary (Last 24 hours) at 07/08/2022 1121 Last data filed at 07/08/2022 0641 Gross per 24 hour  Intake 120 ml  Output 2400 ml  Net -2280 ml      07/07/2022   11:41 AM 07/07/2022    7:33 AM 07/05/2022    6:13 PM  Last 3 Weights  Weight (lbs) 138 lb 1.6 oz 148 lb 12.8 oz 149 lb  7.6 oz  Weight (kg) 62.642 kg 67.495 kg 67.8 kg      Telemetry  Overnight telemetry shows AFL 70-110 bpm, which I personally reviewed.   ECG  The most recent ECG shows A-fib heart 78, diffuse anterolateral T wave inversions, which I personally reviewed.   Physical Exam   Vitals:   07/07/22 1700 07/07/22 2039 07/08/22 0636 07/08/22 0953  BP: 133/80 110/78 (!) 126/93 116/79  Pulse: 81 78 70 80  Resp: 18 18    Temp: 97.8 F (36.6 C) 98.4 F (36.9 C)  (!) 97.5 F (36.4 C)  TempSrc: Oral Oral  Oral  SpO2: 100% 97%  100%  Weight:        Intake/Output Summary (Last 24 hours) at 07/08/2022 1121 Last data filed at 07/08/2022 0641 Gross per 24 hour  Intake 120 ml  Output 2400 ml  Net -2280 ml       07/07/2022   11:41 AM 07/07/2022    7:33 AM 07/05/2022    6:13 PM  Last 3 Weights  Weight (lbs) 138 lb 1.6 oz 148 lb 12.8 oz 149 lb 7.6 oz  Weight (kg) 62.642 kg 67.495 kg  67.8 kg    Body mass index is 21 kg/m.  General: Well nourished, well developed, in no acute distress Head: Atraumatic, normal size  Eyes: PEERLA, EOMI  Neck: Supple, no JVD Endocrine: No thryomegaly Cardiac: Normal S1, S2; irregular rhythm, no murmurs Lungs: Clear to auscultation bilaterally, no wheezing, rhonchi or rales  Abd: Soft, nontender, no hepatomegaly  Ext: No edema, pulses 2+ Musculoskeletal: No deformities, BUE and BLE strength normal and equal Skin: Warm and dry, no rashes   Neuro: Alert and oriented to person, place, time, and situation, CNII-XII grossly intact, no focal deficits  Psych: Normal mood and affect   Labs  High Sensitivity Troponin:  No results for input(s): "TROPONINIHS" in the last 720 hours.   Cardiac EnzymesNo results for input(s): "TROPONINI" in the last 168 hours. No results for input(s): "TROPIPOC" in the last 168 hours.  Chemistry Recent Labs  Lab 07/03/22 1930 07/03/22 2021 07/04/22 0052 07/04/22 1642 07/05/22 0802 07/07/22 0306 07/07/22 1550  NA  --    < > 140    < > 137 136 135  K  --    < > 6.3*   < > 5.1 4.2 3.9  CL  --    < > 96*   < > 98 96* 93*  CO2  --   --  23   < > 25 30 33*  GLUCOSE  --    < > 48*   < > 95 117* 86  BUN  --    < > 120*   < > 58* 43* 21  CREATININE  --    < > 16.32*   < > 10.31* 9.65* 6.27*  CALCIUM  --   --  8.2*   < > 9.3 9.2 9.2  PROT 8.1  --  7.0  --   --   --   --   ALBUMIN 3.5  --  3.2*  --   --   --  3.1*  AST 56*  --  36  --   --   --   --   ALT 45*  --  40  --   --   --   --   ALKPHOS 104  --  93  --   --   --   --   BILITOT 1.8*  --  1.7*  --   --   --   --   GFRNONAA  --   --  3*   < > 5* 5* 9*  ANIONGAP  --   --  21*   < > '14 10 9   '$ < > = values in this interval not displayed.    Hematology Recent Labs  Lab 07/05/22 0804 07/07/22 0306 07/07/22 1550  WBC 3.7* 3.5* 3.4*  RBC 3.41* 3.61* 3.99*  HGB 10.4* 11.0* 11.7*  HCT 32.1* 34.2* 38.0*  MCV 94.1 94.7 95.2  MCH 30.5 30.5 29.3  MCHC 32.4 32.2 30.8  RDW 17.9* 17.7* 17.5*  PLT 109* 127* 139*   BNPNo results for input(s): "BNP", "PROBNP" in the last 168 hours.  DDimer No results for input(s): "DDIMER" in the last 168 hours.   Radiology  ECHOCARDIOGRAM COMPLETE  Result Date: 07/07/2022    ECHOCARDIOGRAM REPORT   Patient Name:   Raymond [name] Date of Exam: 07/07/2022 Medical Rec #:  [id number]     Height:       68.0 in Accession #:    [id number]    Weight:  138.1 lb Date of Birth:  02/17/1956     BSA:          1.746 m Patient Age:    89 years      BP:           133/80 mmHg Patient Gender: M             HR:           80 bpm. Exam Location:  Inpatient Procedure: 2D Echo, Cardiac Doppler and Color Doppler Indications:    Abnormal ECG R94.31  History:        Patient has prior history of Echocardiogram examinations, most                 recent 04/01/2024. CAD, COPD, Arrythmias:Atrial Fibrillation;                 Risk Factors:Diabetes, Hypertension, Dyslipidemia, Non-Smoker                 and Sleep Apnea.  Sonographer:    Greer Pickerel Referring Phys:  [phone] Raymond [name]  Sonographer Comments: Image acquisition challenging due to respiratory motion. IMPRESSIONS  1. Left ventricular ejection fraction, by estimation, is 45 to 50%. The left ventricle has mildly decreased function. The left ventricle demonstrates global hypokinesis. 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 Left ventricular diastolic parameters are indeterminate.  2. Right ventricular systolic function is normal. The right ventricular size is normal. There is normal pulmonary artery systolic pressure.  3. Left atrial size was severely dilated.  4. Right atrial size was severely dilated.  5. The mitral valve is normal in structure. No evidence of mitral valve regurgitation. No evidence of mitral stenosis. Moderate mitral annular calcification.  6. Tricuspid valve regurgitation is moderate.  7. The aortic valve is calcified. There is moderate calcification of the aortic valve. There is moderate thickening of the aortic valve. Aortic valve regurgitation is not visualized. Aortic valve sclerosis/calcification is present, without any evidence of aortic stenosis.  8. 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  9. The inferior vena cava is normal in size with greater than 50% respiratory variability, suggesting right atrial pressure of 3 mmHg. FINDINGS  Left Ventricle: Left ventricular ejection fraction, by estimation, is 45 to 50%. The left ventricle has mildly decreased function. The left ventricle demonstrates global hypokinesis. The left ventricular internal cavity size was normal in size. There is  severe asymmetric left ventricular hypertrophy of the inferior segment. Left ventricular diastolic function could not be evaluated due to mitral annular calcification (moderate or greater). Left ventricular diastolic parameters are indeterminate. Right Ventricle: The right ventricular size is  normal. No increase in right ventricular wall thickness. Right ventricular systolic function is normal. There is normal pulmonary artery systolic pressure. The tricuspid regurgitant velocity is 1.75 m/s, and  with an assumed right atrial pressure of 3 mmHg, the estimated right ventricular systolic pressure is 96.0 mmHg. Left Atrium: Left atrial size was severely dilated. Right Atrium: Right atrial size was severely dilated. Pericardium: There is no evidence of pericardial effusion. Mitral Valve: The mitral valve is normal in structure. Moderate mitral annular calcification. No evidence of mitral valve regurgitation. No evidence of mitral valve stenosis. Tricuspid Valve: The tricuspid valve is normal in structure. Tricuspid valve regurgitation is moderate . No evidence of tricuspid stenosis. Aortic Valve: The aortic valve is calcified. There is moderate  calcification of the aortic valve. There is moderate thickening of the aortic valve. There is moderate aortic valve annular calcification. Aortic valve regurgitation is not visualized. Aortic  valve sclerosis/calcification is present, without any evidence of aortic stenosis. Pulmonic Valve: The pulmonic valve was normal in structure. Pulmonic valve regurgitation is trivial. No evidence of pulmonic stenosis. Aorta: The aortic root is normal in size and structure. 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 Venous: The inferior vena cava is normal in size with greater than 50% respiratory variability, suggesting right atrial pressure of 3 mmHg. IAS/Shunts: No atrial level shunt detected by color flow Doppler.  LEFT VENTRICLE PLAX 2D LVIDd:         4.00 cm   Diastology LVIDs:         3.60 cm   LV e' medial:    3.48 cm/s LV PW:         1.60 cm   LV E/e' medial:  19.3 LV IVS:        0.90 cm   LV e' lateral:   9.25 cm/s LVOT diam:     2.00 cm   LV E/e' lateral: 7.3 LV SV:         34 LV SV Index:   19  LVOT Area:     3.14 cm  RIGHT VENTRICLE RV S prime:     10.30 cm/s TAPSE (M-mode): 0.8 cm LEFT ATRIUM              Index        RIGHT ATRIUM           Index LA diam:        4.40 cm  2.52 cm/m   RA Area:     32.70 cm LA Vol (A2C):   89.2 ml  51.09 ml/m  RA Volume:   136.00 ml 77.89 ml/m LA Vol (A4C):   140.0 ml 80.18 ml/m LA Biplane Vol: 110.0 ml 63.00 ml/m  AORTIC VALVE LVOT Vmax:   69.20 cm/s LVOT Vmean:  45.900 cm/s LVOT VTI:    0.108 m  AORTA Ao Root diam: 4.10 cm Ao Asc diam:  4.30 cm MITRAL VALVE               TRICUSPID VALVE MV Area (PHT): 3.97 cm    TR Peak grad:   12.2 mmHg MV Decel Time: 191 msec    TR Vmax:        175.00 cm/s MV E velocity: 67.30 cm/s MV A velocity: 37.70 cm/s  SHUNTS MV E/A ratio:  1.79        Systemic VTI:  0.11 m                            Systemic Diam: 2.00 cm Kardie Tobb DO Electronically signed by Berniece Salines DO Signature Date/Time: 07/07/2022/5:27:57 PM    Final     Cardiac Studies  TTE 07/07/2022  1. Left ventricular ejection fraction, by estimation, is 45 to 50%. The  left ventricle has mildly decreased function. The left ventricle  demonstrates global hypokinesis. There is severe asymmetric left  ventricular hypertrophy of the inferior segment.  Left ventricular diastolic parameters are indeterminate.   2. Right ventricular systolic function is normal. The right ventricular  size is normal. There is normal pulmonary artery systolic pressure.   3. Left atrial size was severely dilated.  4. Right atrial size was severely dilated.   5. The mitral valve is normal in structure. No evidence of mitral valve  regurgitation. No evidence of mitral stenosis. Moderate mitral annular  calcification.   6. Tricuspid valve regurgitation is moderate.   7. The aortic valve is calcified. There is moderate calcification of the  aortic valve. There is moderate thickening of the aortic valve. Aortic  valve regurgitation is not visualized. Aortic valve   sclerosis/calcification is present, without any evidence  of aortic stenosis.   8. There is dilatation of the aortic root, measuring 41 mm. There is  dilatation of the ascending aorta, measuring 43 mm.   9. The inferior vena cava is normal in size with greater than 50%  respiratory variability, suggesting right atrial pressure of 3 mmHg.   Patient Profile  Raymond [name] is a 66 y.o. male with ESRD on hemodialysis, PAD status post peripheral intervention, nonobstructive CAD, hypertension, atrial fibrillation/flutter (not on anticoagulation due to prior GI bleed), substance abuse who was admitted to the hospital on 07/03/2022 for acute pulmonary edema secondary to missing hemodialysis sessions.  Cardiology was consulted for abnormal EKG.  Assessment & Plan   # Abnormal EKG with new anterolateral T wave inversions -Very pronounced EKG changes.  Echo shows LVEF is relatively preserved at 50% with no wall motion abnormality.  He reports no chest pain.  There are no findings to suggest a stress-induced cardiomyopathy. -Echo does not show apical variant hypertrophic cardiomyopathy but does show asymmetric LVH.  This is not really new.  He is EKG has changed. -Do not suspect coronary ischemia given preserved EF with no wall motion normality. -Discussed case with primary rounding team.  These could represent cerebral T waves or possible stroke.  He has no symptoms to suggest an acute stroke.  Brain MRI will be ordered by the primary team. -Left heart cath in 2019 showed nonobstructive CAD. -Would recommend just medical management for now.  Follow-up results of brain MRI.  # Persistent atrial fibrillation/flutter -Not on anticoagulation due to history of severe GI bleed. -Continue to hold Eliquis. -Rate controlled on carvedilol 25 mg twice daily.  No need for amiodarone.  This appears to be permanent atrial fibrillation.  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 -No focal deficits.  Left heart cath in 2019 unremarkable. -Continue with medical management.  Coreg 25 mg twice daily.  I have added hydralazine 10 mg 3 times daily and Imdur 15 mg daily. -No ACE/ARB/ARNI/MRA given ESRD.  We will arrange outpatient follow-up.  # Substance abuse -Advised to refrain from alcohol and cocaine.  # COVID-19 infection # C. difficile colitis -Per hospital medicine  [place] will sign off.   Medication Recommendations: As above Other recommendations (labs, testing, etc): None Follow up as an outpatient: We will arrange outpatient follow-up in 2 to 3 weeks  For questions or updates, please contact [place] Please consult [email or web address] for contact info under        Signed, [place] T. Audie Box, MD, [place]  07/08/2022 11:21 AM

## 2022-07-08 NOTE — Progress Notes (Signed)
[facility] Progress Note   Subjective:   Seen in room, reports he feels fine. Denies SOB, CP< palpitations, dizziness and nausea.   Objective Vitals:   07/07/22 1700 07/07/22 2039 07/08/22 0636 07/08/22 0953  BP: 133/80 110/78 (!) 126/93 116/79  Pulse: 81 78 70 80  Resp: 18 18    Temp: 97.8 F (36.6 C) 98.4 F (36.9 C)  (!) 97.5 F (36.4 C)  TempSrc: Oral Oral  Oral  SpO2: 100% 97%  100%  Weight:       Physical Exam General: Alert male in NAD Heart: RRR, no murmurs, rubs or gallops Lungs: CTA bilaterally without wheezing, rhonchi or rales Abdomen: Soft, non-distended, +BS Extremities: No edema b/l lower extremities Dialysis Access: LUE AVF + bruit  Additional Objective Labs: Basic Metabolic Panel: Recent Labs  Lab 07/05/22 0802 07/05/22 0804 07/07/22 0306 07/07/22 1550  NA 137  --  136 135  K 5.1  --  4.2 3.9  CL 98  --  96* 93*  CO2 25  --  30 33*  GLUCOSE 95  --  117* 86  BUN 58*  --  43* 21  CREATININE 10.31*  --  9.65* 6.27*  CALCIUM 9.3  --  9.2 9.2  PHOS  --  6.6*  --  3.4   Liver Function Tests: Recent Labs  Lab 07/03/22 1930 07/04/22 0052 07/07/22 1550  AST 56* 36  --   ALT 45* 40  --   ALKPHOS 104 93  --   BILITOT 1.8* 1.7*  --   PROT 8.1 7.0  --   ALBUMIN 3.5 3.2* 3.1*   No results for input(s): "LIPASE", "AMYLASE" in the last 168 hours. CBC: Recent Labs  Lab 07/03/22 1930 07/03/22 2021 07/04/22 0052 07/05/22 0804 07/07/22 0306 07/07/22 1550  WBC 4.9  --  4.3 3.7* 3.5* 3.4*  NEUTROABS 3.8  --   --   --  1.7  --   HGB 11.2*   < > 10.4* 10.4* 11.0* 11.7*  HCT 35.3*   < > 33.3* 32.1* 34.2* 38.0*  MCV 93.9  --  94.9 94.1 94.7 95.2  PLT 85*  --  76* 109* 127* 139*   < > = values in this interval not displayed.   Blood Culture    Component Value Date/Time   SDES BLOOD RIGHT HAND 11/14/2021 1616   SDES BLOOD RIGHT WRIST 11/14/2021 1616   SPECREQUEST  11/14/2021 1616    BOTTLES DRAWN AEROBIC ONLY Blood Culture  results may not be optimal due to an inadequate volume of blood received in culture bottles   SPECREQUEST  11/14/2021 1616    BOTTLES DRAWN AEROBIC AND ANAEROBIC Blood Culture results may not be optimal due to an inadequate volume of blood received in culture bottles   CULT  11/14/2021 1616    NO GROWTH 5 DAYS Performed at [facility], [place] [address], [place], [place] [address]    CULT  11/14/2021 1616    NO GROWTH 5 DAYS Performed at [facility] [name] [place], [place] [address]    REPTSTATUS 11/19/2021 FINAL 11/14/2021 1616   REPTSTATUS 11/19/2021 FINAL 11/14/2021 1616    Cardiac Enzymes: No results for input(s): "CKTOTAL", "CKMB", "CKMBINDEX", "TROPONINI" in the last 168 hours. CBG: Recent Labs  Lab 07/07/22 1158 07/07/22 1703 07/07/22 2038 07/07/22 2337 07/08/22 0739  GLUCAP 81 125* 118* 118* 103*   Iron Studies: No results for input(s): "IRON", "TIBC", "TRANSFERRIN", "FERRITIN" in the  last 72 hours. '@[email or web address]'$ @ Studies/Results: ECHOCARDIOGRAM COMPLETE  Result Date: 07/07/2022    ECHOCARDIOGRAM REPORT   Patient Name:   Raymond [name] Date of Exam: 07/07/2022 Medical Rec #:  [id number]     Height:       68.0 in Accession #:    [id number]    Weight:       138.1 lb Date of Birth:  1956/02/15     BSA:          1.746 m Patient Age:    66 years      BP:           133/80 mmHg Patient Gender: M             HR:           80 bpm. Exam Location:  Inpatient Procedure: 2D Echo, Cardiac Doppler and Color Doppler Indications:    Abnormal ECG R94.31  History:        Patient has prior history of Echocardiogram examinations, most                 recent 04/01/2024. CAD, COPD, Arrythmias:Atrial Fibrillation;                 Risk Factors:Diabetes, Hypertension, Dyslipidemia, Non-Smoker                 and Sleep Apnea.  Sonographer:    Greer Pickerel Referring Phys: [phone] RALPH A NETTEY  Sonographer Comments: Image acquisition challenging due to respiratory motion. IMPRESSIONS  1.  Left ventricular ejection fraction, by estimation, is 45 to 50%. The left ventricle has mildly decreased function. The left ventricle demonstrates global hypokinesis. 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 Left ventricular diastolic parameters are indeterminate.  2. Right ventricular systolic function is normal. The right ventricular size is normal. There is normal pulmonary artery systolic pressure.  3. Left atrial size was severely dilated.  4. Right atrial size was severely dilated.  5. The mitral valve is normal in structure. No evidence of mitral valve regurgitation. No evidence of mitral stenosis. Moderate mitral annular calcification.  6. Tricuspid valve regurgitation is moderate.  7. The aortic valve is calcified. There is moderate calcification of the aortic valve. There is moderate thickening of the aortic valve. Aortic valve regurgitation is not visualized. Aortic valve sclerosis/calcification is present, without any evidence of aortic stenosis.  8. 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  9. The inferior vena cava is normal in size with greater than 50% respiratory variability, suggesting right atrial pressure of 3 mmHg. FINDINGS  Left Ventricle: Left ventricular ejection fraction, by estimation, is 45 to 50%. The left ventricle has mildly decreased function. The left ventricle demonstrates global hypokinesis. The left ventricular internal cavity size was normal in size. There is  severe asymmetric left ventricular hypertrophy of the inferior segment. Left ventricular diastolic function could not be evaluated due to mitral annular calcification (moderate or greater). Left ventricular diastolic parameters are indeterminate. Right Ventricle: The right ventricular size is normal. No increase in right ventricular wall thickness. Right ventricular systolic function is normal. There is normal  pulmonary artery systolic pressure. The tricuspid regurgitant velocity is 1.75 m/s, and  with an assumed right atrial pressure of 3 mmHg, the estimated right ventricular systolic pressure is 90.3 mmHg. Left Atrium: Left atrial size was severely dilated. Right Atrium: Right atrial size was severely dilated.  Pericardium: There is no evidence of pericardial effusion. Mitral Valve: The mitral valve is normal in structure. Moderate mitral annular calcification. No evidence of mitral valve regurgitation. No evidence of mitral valve stenosis. Tricuspid Valve: The tricuspid valve is normal in structure. Tricuspid valve regurgitation is moderate . No evidence of tricuspid stenosis. Aortic Valve: The aortic valve is calcified. There is moderate calcification of the aortic valve. There is moderate thickening of the aortic valve. There is moderate aortic valve annular calcification. Aortic valve regurgitation is not visualized. Aortic  valve sclerosis/calcification is present, without any evidence of aortic stenosis. Pulmonic Valve: The pulmonic valve was normal in structure. Pulmonic valve regurgitation is trivial. No evidence of pulmonic stenosis. Aorta: The aortic root is normal in size and structure. 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 Venous: The inferior vena cava is normal in size with greater than 50% respiratory variability, suggesting right atrial pressure of 3 mmHg. IAS/Shunts: No atrial level shunt detected by color flow Doppler.  LEFT VENTRICLE PLAX 2D LVIDd:         4.00 cm   Diastology LVIDs:         3.60 cm   LV e' medial:    3.48 cm/s LV PW:         1.60 cm   LV E/e' medial:  19.3 LV IVS:        0.90 cm   LV e' lateral:   9.25 cm/s LVOT diam:     2.00 cm   LV E/e' lateral: 7.3 LV SV:         34 LV SV Index:   19 LVOT Area:     3.14 cm  RIGHT VENTRICLE RV S prime:     10.30 cm/s TAPSE (M-mode): 0.8 cm LEFT ATRIUM               Index        RIGHT ATRIUM           Index LA diam:        4.40 cm  2.52 cm/m   RA Area:     32.70 cm LA Vol (A2C):   89.2 ml  51.09 ml/m  RA Volume:   136.00 ml 77.89 ml/m LA Vol (A4C):   140.0 ml 80.18 ml/m LA Biplane Vol: 110.0 ml 63.00 ml/m  AORTIC VALVE LVOT Vmax:   69.20 cm/s LVOT Vmean:  45.900 cm/s LVOT VTI:    0.108 m  AORTA Ao Root diam: 4.10 cm Ao Asc diam:  4.30 cm MITRAL VALVE               TRICUSPID VALVE MV Area (PHT): 3.97 cm    TR Peak grad:   12.2 mmHg MV Decel Time: 191 msec    TR Vmax:        175.00 cm/s MV E velocity: 67.30 cm/s MV A velocity: 37.70 cm/s  SHUNTS MV E/A ratio:  1.79        Systemic VTI:  0.11 m                            Systemic Diam: 2.00 cm Kardie Tobb DO Electronically signed by Berniece Salines DO Signature Date/Time: 07/07/2022/5:27:57 PM    Final    Medications:  sodium chloride     anticoagulant sodium citrate      aspirin  81 mg Oral Daily  atorvastatin  40 mg Oral Daily   calcitRIOL  0.75 mcg Oral Q T,Th,Sa-HD   carvedilol  25 mg Oral BID WC   Chlorhexidine Gluconate Cloth  6 each Topical Q0600   cinacalcet  60 mg Oral Q T,Th,Sa-HD   feeding supplement (NEPRO CARB STEADY)  237 mL Oral BID BM   glucose  4 tablet Oral Once   hydrALAZINE  10 mg Oral Q8H   isosorbide mononitrate  15 mg Oral Daily   multivitamin  1 tablet Oral QHS   pantoprazole  40 mg Oral Daily   sertraline  25 mg Oral Daily   sevelamer carbonate  4,000 mg Oral TID WC   sodium chloride flush  3 mL Intravenous Q12H   vancomycin  125 mg Oral QID    Dialysis Orders: TTS [facility] 4h  400/1.5  68.9kg  2/2 bath  LFA AVF  Hep none - last HD 12/19, post wt 71.1kg  - venofer 50  IVq wk - rocaltrol 0.75 mcg po tiw - sensipar 60 mg po tiw - mircera 200 MCG IV q2, last 12/7, due 12/21  Assessment/Plan: Severe hyperkalemia in setting of missed HD - Resolved with HD  + C diff-oral vanc per primary  + COVID-19 per primary, denies any diarrhea today PAF-rate controlled. Cardiology was  consulted for new T wave inversions, suspected to be stress induced, no CP today. Echo pending.   ESRD - on HD TTS. Hx of missed HD. Next HD 07/09/2022.  HTN/ vol - Under EDW with soft BP earlier this admission. Hold Amlodipine, continue carvedilol for rate control. Lower EDW on discharge.  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 Cont renvela as binder and po vdra w/ HD.  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 Follow trends.   Anice Paganini, PA-C 07/08/2022, 11:07 AM  [facility] Pager: [phone]

## 2022-07-08 NOTE — Progress Notes (Signed)
DISCHARGE NOTE HOME Brack E Demo to be discharged Home per MD order. Discussed prescriptions and follow up appointments with the patient. Prescriptions given to patient; medication list explained in detail. Patient verbalized understanding.  Skin clean, dry and intact without evidence of skin break down, no evidence of skin tears noted. IV catheter discontinued intact. Site without signs and symptoms of complications. Dressing and pressure applied. Pt denies pain at the site currently. No complaints noted.  Patient free of lines, drains, and wounds.   An After Visit Summary (AVS) was printed and given to the patient. Patient escorted via wheelchair, and discharged home via private auto.  Keenan Bachelor, RN

## 2022-07-09 ENCOUNTER — Telehealth: Payer: Self-pay | Admitting: Physician Assistant

## 2022-07-09 NOTE — Telephone Encounter (Signed)
Transition of care contact from inpatient facility  Date of Discharge: 07/08/22 Date of Contact: 07/09/22 Method of contact: Phone  Attempted to contact patient to discuss transition of care from inpatient admission. Patient did not answer the phone. Unable to leave message. Will follow up with patient at outpatient HD center.  Anice Paganini, PA-C 07/09/2022, 11:04 AM  [facility] Pager: [phone]

## 2022-07-21 ENCOUNTER — Emergency Department (HOSPITAL_COMMUNITY): Payer: Medicare HMO

## 2022-07-21 ENCOUNTER — Encounter (HOSPITAL_COMMUNITY): Payer: Self-pay | Admitting: Internal Medicine

## 2022-07-21 ENCOUNTER — Inpatient Hospital Stay (HOSPITAL_COMMUNITY)
Admission: EM | Admit: 2022-07-21 | Discharge: 2022-07-24 | DRG: 377 | Disposition: A | Discharge status: Home / Self Care | Payer: Medicare HMO | Attending: Internal Medicine | Admitting: Internal Medicine

## 2022-07-21 ENCOUNTER — Other Ambulatory Visit: Payer: Self-pay

## 2022-07-21 DIAGNOSIS — K922 Gastrointestinal hemorrhage, unspecified: Secondary | ICD-10-CM | POA: Diagnosis not present

## 2022-07-21 DIAGNOSIS — D631 Anemia in chronic kidney disease: Secondary | ICD-10-CM | POA: Diagnosis present

## 2022-07-21 DIAGNOSIS — E1122 Type 2 diabetes mellitus with diabetic chronic kidney disease: Secondary | ICD-10-CM | POA: Diagnosis present

## 2022-07-21 DIAGNOSIS — K219 Gastro-esophageal reflux disease without esophagitis: Secondary | ICD-10-CM | POA: Diagnosis present

## 2022-07-21 DIAGNOSIS — Z1152 Encounter for screening for COVID-19: Secondary | ICD-10-CM | POA: Diagnosis not present

## 2022-07-21 DIAGNOSIS — E785 Hyperlipidemia, unspecified: Secondary | ICD-10-CM | POA: Diagnosis present

## 2022-07-21 DIAGNOSIS — I2510 Atherosclerotic heart disease of native coronary artery without angina pectoris: Secondary | ICD-10-CM | POA: Diagnosis present

## 2022-07-21 DIAGNOSIS — Z79899 Other long term (current) drug therapy: Secondary | ICD-10-CM | POA: Diagnosis not present

## 2022-07-21 DIAGNOSIS — D61818 Other pancytopenia: Secondary | ICD-10-CM | POA: Diagnosis present

## 2022-07-21 DIAGNOSIS — R4182 Altered mental status, unspecified: Secondary | ICD-10-CM

## 2022-07-21 DIAGNOSIS — F32A Depression, unspecified: Secondary | ICD-10-CM | POA: Diagnosis present

## 2022-07-21 DIAGNOSIS — E1129 Type 2 diabetes mellitus with other diabetic kidney complication: Secondary | ICD-10-CM | POA: Diagnosis present

## 2022-07-21 DIAGNOSIS — D649 Anemia, unspecified: Principal | ICD-10-CM

## 2022-07-21 DIAGNOSIS — E1151 Type 2 diabetes mellitus with diabetic peripheral angiopathy without gangrene: Secondary | ICD-10-CM | POA: Diagnosis present

## 2022-07-21 DIAGNOSIS — K31811 Angiodysplasia of stomach and duodenum with bleeding: Secondary | ICD-10-CM | POA: Diagnosis present

## 2022-07-21 DIAGNOSIS — K921 Melena: Secondary | ICD-10-CM | POA: Diagnosis not present

## 2022-07-21 DIAGNOSIS — F17210 Nicotine dependence, cigarettes, uncomplicated: Secondary | ICD-10-CM | POA: Diagnosis present

## 2022-07-21 DIAGNOSIS — I480 Paroxysmal atrial fibrillation: Secondary | ICD-10-CM | POA: Diagnosis not present

## 2022-07-21 DIAGNOSIS — G9341 Metabolic encephalopathy: Secondary | ICD-10-CM | POA: Diagnosis present

## 2022-07-21 DIAGNOSIS — Z8774 Personal history of (corrected) congenital malformations of heart and circulatory system: Secondary | ICD-10-CM

## 2022-07-21 DIAGNOSIS — J449 Chronic obstructive pulmonary disease, unspecified: Secondary | ICD-10-CM | POA: Diagnosis present

## 2022-07-21 DIAGNOSIS — I132 Hypertensive heart and chronic kidney disease with heart failure and with stage 5 chronic kidney disease, or end stage renal disease: Secondary | ICD-10-CM | POA: Diagnosis present

## 2022-07-21 DIAGNOSIS — N185 Chronic kidney disease, stage 5: Secondary | ICD-10-CM | POA: Diagnosis present

## 2022-07-21 DIAGNOSIS — D62 Acute posthemorrhagic anemia: Secondary | ICD-10-CM | POA: Diagnosis not present

## 2022-07-21 DIAGNOSIS — Z833 Family history of diabetes mellitus: Secondary | ICD-10-CM

## 2022-07-21 DIAGNOSIS — Z7901 Long term (current) use of anticoagulants: Secondary | ICD-10-CM | POA: Diagnosis not present

## 2022-07-21 DIAGNOSIS — Z8249 Family history of ischemic heart disease and other diseases of the circulatory system: Secondary | ICD-10-CM

## 2022-07-21 DIAGNOSIS — Z8719 Personal history of other diseases of the digestive system: Secondary | ICD-10-CM

## 2022-07-21 DIAGNOSIS — N186 End stage renal disease: Secondary | ICD-10-CM | POA: Diagnosis present

## 2022-07-21 DIAGNOSIS — K5521 Angiodysplasia of colon with hemorrhage: Secondary | ICD-10-CM | POA: Diagnosis present

## 2022-07-21 DIAGNOSIS — Z992 Dependence on renal dialysis: Secondary | ICD-10-CM | POA: Diagnosis not present

## 2022-07-21 DIAGNOSIS — Z8616 Personal history of COVID-19: Secondary | ICD-10-CM

## 2022-07-21 DIAGNOSIS — K3189 Other diseases of stomach and duodenum: Secondary | ICD-10-CM | POA: Diagnosis not present

## 2022-07-21 DIAGNOSIS — I5022 Chronic systolic (congestive) heart failure: Secondary | ICD-10-CM | POA: Diagnosis present

## 2022-07-21 DIAGNOSIS — K5520 Angiodysplasia of colon without hemorrhage: Secondary | ICD-10-CM | POA: Diagnosis not present

## 2022-07-21 LAB — CBC WITH DIFFERENTIAL/PLATELET
Abs Immature Granulocytes: 0.01 10*3/uL (ref 0.00–0.07)
Basophils Absolute: 0 10*3/uL (ref 0.0–0.1)
Basophils Relative: 0 %
Eosinophils Absolute: 0 10*3/uL (ref 0.0–0.5)
Eosinophils Relative: 1 %
HCT: 20.7 % — ABNORMAL LOW (ref 39.0–52.0)
Hemoglobin: 7 g/dL — ABNORMAL LOW (ref 13.0–17.0)
Immature Granulocytes: 0 %
Lymphocytes Relative: 24 %
Lymphs Abs: 0.6 10*3/uL — ABNORMAL LOW (ref 0.7–4.0)
MCH: 31 pg (ref 26.0–34.0)
MCHC: 33.8 g/dL (ref 30.0–36.0)
MCV: 91.6 fL (ref 80.0–100.0)
Monocytes Absolute: 0.3 10*3/uL (ref 0.1–1.0)
Monocytes Relative: 13 %
Neutro Abs: 1.6 10*3/uL — ABNORMAL LOW (ref 1.7–7.7)
Neutrophils Relative %: 62 %
Platelets: 103 10*3/uL — ABNORMAL LOW (ref 150–400)
RBC: 2.26 MIL/uL — ABNORMAL LOW (ref 4.22–5.81)
RDW: 17.3 % — ABNORMAL HIGH (ref 11.5–15.5)
WBC: 2.6 10*3/uL — ABNORMAL LOW (ref 4.0–10.5)
nRBC: 0 % (ref 0.0–0.2)

## 2022-07-21 LAB — PREPARE RBC (CROSSMATCH)

## 2022-07-21 LAB — COMPREHENSIVE METABOLIC PANEL
ALT: 14 U/L (ref 0–44)
AST: 10 U/L — ABNORMAL LOW (ref 15–41)
Albumin: 2.9 g/dL — ABNORMAL LOW (ref 3.5–5.0)
Alkaline Phosphatase: 87 U/L (ref 38–126)
Anion gap: 16 — ABNORMAL HIGH (ref 5–15)
BUN: 88 mg/dL — ABNORMAL HIGH (ref 8–23)
CO2: 28 mmol/L (ref 22–32)
Calcium: 8.8 mg/dL — ABNORMAL LOW (ref 8.9–10.3)
Chloride: 97 mmol/L — ABNORMAL LOW (ref 98–111)
Creatinine, Ser: 12.52 mg/dL — ABNORMAL HIGH (ref 0.61–1.24)
GFR, Estimated: 4 mL/min — ABNORMAL LOW (ref >60)
Glucose, Bld: 101 mg/dL — ABNORMAL HIGH (ref 70–99)
Potassium: 4.1 mmol/L (ref 3.5–5.1)
Sodium: 141 mmol/L (ref 135–145)
Total Bilirubin: 0.8 mg/dL (ref 0.3–1.2)
Total Protein: 6.5 g/dL (ref 6.5–8.1)

## 2022-07-21 LAB — AMMONIA: Ammonia: 15 umol/L (ref 9–35)

## 2022-07-21 LAB — I-STAT CHEM 8, ED
BUN: 102 mg/dL — ABNORMAL HIGH (ref 8–23)
Calcium, Ion: 1.01 mmol/L — ABNORMAL LOW (ref 1.15–1.40)
Chloride: 98 mmol/L (ref 98–111)
Creatinine, Ser: 12.6 mg/dL — ABNORMAL HIGH (ref 0.61–1.24)
Glucose, Bld: 66 mg/dL — ABNORMAL LOW (ref 70–99)
HCT: 23 % — ABNORMAL LOW (ref 39.0–52.0)
Hemoglobin: 7.8 g/dL — ABNORMAL LOW (ref 13.0–17.0)
Potassium: 5.3 mmol/L — ABNORMAL HIGH (ref 3.5–5.1)
Sodium: 139 mmol/L (ref 135–145)
TCO2: 29 mmol/L (ref 22–32)

## 2022-07-21 LAB — CBG MONITORING, ED
Glucose-Capillary: 56 mg/dL — ABNORMAL LOW (ref 70–99)
Glucose-Capillary: 96 mg/dL (ref 70–99)

## 2022-07-21 LAB — RESP PANEL BY RT-PCR (RSV, FLU A&B, COVID)  RVPGX2
Influenza A by PCR: NEGATIVE
Influenza B by PCR: NEGATIVE
Resp Syncytial Virus by PCR: NEGATIVE
SARS Coronavirus 2 by RT PCR: NEGATIVE

## 2022-07-21 LAB — LACTIC ACID, PLASMA: Lactic Acid, Venous: 1.2 mmol/L (ref 0.5–1.9)

## 2022-07-21 LAB — POC OCCULT BLOOD, ED: Fecal Occult Bld: POSITIVE — AB

## 2022-07-21 LAB — ETHANOL: Alcohol, Ethyl (B): <10 mg/dL (ref <10)

## 2022-07-21 MED ORDER — SACCHAROMYCES BOULARDII 250 MG PO CAPS
250.0000 mg | ORAL_CAPSULE | Freq: Two times a day (BID) | ORAL | Status: DC
  Administered 2022-07-21 – 2022-07-24 (×6): 250 mg via ORAL
  Filled 2022-07-21 (×7): qty 1

## 2022-07-21 MED ORDER — DEXTROSE 50 % IV SOLN
1.0000 | Freq: Once | INTRAVENOUS | Status: AC
  Administered 2022-07-21: 50 mL via INTRAVENOUS
  Filled 2022-07-21: qty 50

## 2022-07-21 MED ORDER — SODIUM CHLORIDE 0.9% IV SOLUTION: Freq: Once | INTRAVENOUS | Status: AC

## 2022-07-21 MED ORDER — PANTOPRAZOLE SODIUM 40 MG IV SOLR
40.0000 mg | Freq: Two times a day (BID) | INTRAVENOUS | Status: DC
  Administered 2022-07-21 – 2022-07-23 (×4): 40 mg via INTRAVENOUS
  Filled 2022-07-21 (×4): qty 10

## 2022-07-21 MED ORDER — SODIUM CHLORIDE 0.9 % IV BOLUS: 1000.0000 mL | Freq: Once | INTRAVENOUS | Status: DC

## 2022-07-21 NOTE — H&P (Addendum)
History and Physical  Raymond [name] FAO:[id number] DOB: 24-Sep-1955 DOA: 07/21/2022  Referring physician: Dr. Barnabas Lister, [place]. PCP: [name], FNP  Outpatient Specialists: Nephrology Patient coming from: Group Home through hemodialysis center.  Chief Complaint: Altered mental status.  HPI: Raymond [name] is a 67 y.o. male with medical history significant for end-stage renal disease on hemodialysis Tuesday Thursday Saturday, paroxysmal A-fib/A-flutter not on oral anticoagulation, essential hypertension, chronic systolic CHF, peripheral artery disease on aspirin 81 mg daily, history of AVMs, COPD, secondary hyperparathyroidism, recently admitted on 07/03/2022 and discharged on 07/08/2022 treated for C. difficile infection and electrolytes derangement, who presented to [facility] ED sent from hemodialysis center due to altered mental status.  After 2 and half hours into the session he started exhibiting abnormal behavior like shouting randomly in the room.  In the ED, notable for acute blood loss anemia with hemoglobin 7.0 from 11.7, 2 weeks ago.  Positive FOBT.  Endorses persistent diarrhea with 5 episodes of dark watery stools this morning.  EDP discussed the case with GI who recommended transfusion of 1 unit PRBCs, they will see in consultation.  Not volume overloaded on exam.  Pancytopenic.  Alert and oriented x 1.  Non-contrast CT head was nonacute.  ED Course: Tmax 97.9.  BP 147/88, pulse 114, respiratory 20, O2 saturation 100% on room air.  Lab studies remarkable for WBC 2.6, hemoglobin 7.0, hematocrit 20.7, platelet count 103.  COVID-19 screening test negative, RSV negative.  Influenza A and B by PCR negative.  Glucose 101.  BUN 88, creatinine 12.52, anion gap 16.  Albumin 2.9.  Review of Systems: Review of systems as noted in the HPI. All other systems reviewed and are negative.   Past Medical History:  Diagnosis Date   Anemia    CAD (coronary artery disease)    a. 03/2018: cath showing 69  to 30% stenosis along the LAD, luminal irregularities along the RCA, and angiographically normal LCx   COPD (chronic obstructive pulmonary disease) (HCC)    ESRD (end stage renal disease) on dialysis [facility])    "MWF; Jene[facility] Rinks" (07/22/17)   Hepatitis C    Still positive s/p liver biopsy at [facility]  and interferon therapy for 6 months. Most recent lab work was on 10/24/12   Hepatitis C    "took the tx; gone now" (12/05/2016)  Was treated   History of blood transfusion ~ 2012/2013   "related to my kidneys; blood was low"   Hypertension    Peripheral arterial disease ([place])    Substance abuse ([place])    Thyroid disease    Type 2 diabetes mellitus with other diabetic kidney complication ([place]) 6/96/2952   Past Surgical History:  Procedure Laterality Date   ABDOMINAL AORTOGRAM W/LOWER EXTREMITY N/A 02/08/2018   Procedure: ABDOMINAL AORTOGRAM W/LOWER EXTREMITY;  Surgeon: Conrad Br[place], MD;  Location: [place] CV LAB;  Service: Cardiovascular;  Laterality: N/A;   ABDOMINAL AORTOGRAM W/LOWER EXTREMITY Bilateral 06/15/2020   Procedure: ABDOMINAL AORTOGRAM W/LOWER EXTREMITY;  Surgeon: Waynetta Sandy, MD;  Location: [place] CV LAB;  Service: Cardiovascular;  Laterality: Bilateral;   AV FISTULA PLACEMENT Left Aug. 2013 ?   BIOPSY  01/17/2020   Procedure: BIOPSY;  Surgeon: Jackquline Denmark, MD;  Location: [place];  Service: Endoscopy;;   COLONOSCOPY WITH PROPOFOL N/A 08/21/2018   Procedure: COLONOSCOPY WITH PROPOFOL;  Surgeon: Yetta Flock, MD;  Location: [place];  Service: Gastroenterology;  Laterality: N/A;   ENTEROSCOPY N/A 01/17/2020   Procedure: ENTEROSCOPY;  Surgeon: Jackquline Denmark, MD;  Location: [facility] ENDOSCOPY;  Service: Endoscopy;  Laterality: N/A;   ENTEROSCOPY N/A 07/31/2020   Procedure: ENTEROSCOPY;  Surgeon: Doran Stabler, MD;  Location: [place];  Service: Gastroenterology;  Laterality: N/A;   ENTEROSCOPY N/A 01/27/2021   Procedure: ENTEROSCOPY;  Surgeon:  Yetta Flock, MD;  Location: [facility] ENDOSCOPY;  Service: Gastroenterology;  Laterality: N/A;   ESOPHAGOGASTRODUODENOSCOPY (EGD) WITH PROPOFOL N/A 08/20/2018   Procedure: ESOPHAGOGASTRODUODENOSCOPY (EGD) WITH PROPOFOL;  Surgeon: Gatha Mayer, MD;  Location: [place];  Service: Endoscopy;  Laterality: N/A;   FEMORAL-POPLITEAL BYPASS GRAFT Left 04/11/2018   Procedure: BYPASS GRAFT FEMORAL-POPLITEAL ARTERY LEFT LEG;  Surgeon: Rosetta Posner, MD;  Location: [facility] OR;  Service: Vascular;  Laterality: Left;   GIVENS CAPSULE STUDY N/A 07/31/2020   Procedure: GIVENS CAPSULE STUDY;  Surgeon: Doran Stabler, MD;  Location: [place];  Service: Gastroenterology;  Laterality: N/A;   HOT HEMOSTASIS N/A 08/20/2018   Procedure: HOT HEMOSTASIS (ARGON PLASMA COAGULATION/BICAP);  Surgeon: Gatha Mayer, MD;  Location: [facility] ENDOSCOPY;  Service: Endoscopy;  Laterality: N/A;   HOT HEMOSTASIS N/A 01/17/2020   Procedure: HOT HEMOSTASIS (ARGON PLASMA COAGULATION/BICAP);  Surgeon: Jackquline Denmark, MD;  Location: [facility] ENDOSCOPY;  Service: Endoscopy;  Laterality: N/A;   HOT HEMOSTASIS N/A 01/27/2021   Procedure: HOT HEMOSTASIS (ARGON PLASMA COAGULATION/BICAP);  Surgeon: Yetta Flock, MD;  Location: [facility] ENDOSCOPY;  Service: Gastroenterology;  Laterality: N/A;   LEFT HEART CATH AND CORONARY ANGIOGRAPHY N/A 03/29/2018   Procedure: LEFT HEART CATH AND CORONARY ANGIOGRAPHY;  Surgeon: Nelva Bush, MD;  Location: [place] CV LAB;  Service: Cardiovascular;  Laterality: N/A;   LIVER BIOPSY     ORIF ULNAR FRACTURE Left 05/18/2017   Procedure: OPEN REDUCTION INTERNAL FIXATION (ORIF) ULNAR FRACTURE;  Surgeon: Iran Planas, MD;  Location: [place];  Service: Orthopedics;  Laterality: Left;   PERIPHERAL VASCULAR INTERVENTION Right 06/15/2020   Procedure: PERIPHERAL VASCULAR INTERVENTION;  Surgeon: Waynetta Sandy, MD;  Location: [place] CV LAB;  Service: Cardiovascular;  Laterality: Right;   POLYPECTOMY   08/21/2018   Procedure: POLYPECTOMY;  Surgeon: Yetta Flock, MD;  Location: [place];  Service: Gastroenterology;;   Earney Mallet N/A 12/11/2012   Procedure: Earney Mallet;  Surgeon: Serafina Mitchell, MD;  Location: [facility] CATH LAB;  Service: Cardiovascular;  Laterality: N/A;   WOUND EXPLORATION Left 06/15/2020   Procedure: GROIN  EXPLORATION;  Surgeon: Waynetta Sandy, MD;  Location: [place];  Service: Vascular;  Laterality: Left;    Social History:  reports that he has been smoking cigarettes. He has a 25.00 pack-year smoking history. He has never used smokeless tobacco. He reports current alcohol use of about 10.0 standard drinks of alcohol per week. He reports that he does not currently use drugs after having used the following drugs: Cocaine.   No Known Allergies  Family History  Problem Relation Age of Onset   Diabetes Father    Hypertension Father    Heart disease Father       Prior to Admission medications   Medication Sig Start Date End Date Taking? Authorizing Provider  albuterol (VENTOLIN HFA) 108 (90 Base) MCG/ACT inhaler Inhale 3 puffs into the lungs daily as needed for shortness of breath or wheezing. 11/06/19  Yes [provider]  aspirin 81 MG chewable tablet Chew 1 tablet (81 mg total) by mouth daily. 07/09/22 10/07/22 Yes Mariel Aloe, MD  atorvastatin (LIPITOR) 40 MG tablet Take 1 tablet (40 mg total)  by mouth daily. 07/09/22 10/07/22 Yes Mariel Aloe, MD  BREO ELLIPTA 200-25 MCG/ACT AEPB Inhale 2 puffs into the lungs daily as needed (for shortness of breath). 09/17/21  Yes [provider]  calcitRIOL (ROCALTROL) 0.5 MCG capsule Take 1 capsule (0.5 mcg total) by mouth Every Tuesday,Thursday,and Saturday with dialysis. 12/02/21  Yes Mariel Aloe, MD  carvedilol (COREG) 12.5 MG tablet Take 2 tablets (25 mg total) by mouth every morning AND 1 tablet (12.5 mg total) every evening. 12/29/21  Yes Cleaver, Jossie Ng, NP  cinacalcet (SENSIPAR) 30 MG  tablet Take 6 tablets (180 mg total) by mouth Every Tuesday,Thursday,and Saturday with dialysis. 12/12/20  Yes Mercy Riding, MD  Darbepoetin Alfa (ARANESP) 200 MCG/0.4ML SOSY injection Inject 0.4 mLs (200 mcg total) into the vein every Tuesday with hemodialysis. 05/25/21  Yes France Ravens, MD  hydrALAZINE (APRESOLINE) 10 MG tablet Take 1 tablet (10 mg total) by mouth every 8 (eight) hours. 07/08/22 10/06/22 Yes Mariel Aloe, MD  isosorbide mononitrate (IMDUR) 30 MG 24 hr tablet Take 1/2 tablet (15 mg total) by mouth daily. 07/09/22 10/07/22 Yes Mariel Aloe, MD  loperamide (IMODIUM) 2 MG capsule Take 2 mg by mouth as needed for diarrhea or loose stools. 07/18/22  Yes [provider]  metroNIDAZOLE (FLAGYL) 500 MG tablet Take 500 mg by mouth 3 (three) times daily. 07/18/22  Yes [provider]  multivitamin (RENA-VIT) TABS tablet Take 1 tablet by mouth at bedtime. 09/17/21  Yes Sheikh, Omair Latif, DO  pantoprazole (PROTONIX) 40 MG tablet Take 1 tablet (40 mg total) by mouth 2 (two) times daily before a meal. Patient taking differently: Take 40 mg by mouth daily. 09/17/21  Yes Sheikh, Omair Latif, DO  sertraline (ZOLOFT) 25 MG tablet Take 25 mg by mouth daily. 12/07/21  Yes [provider]  sevelamer carbonate (RENVELA) 800 MG tablet Take 5 tablets (4,000 mg total) by mouth 3 (three) times daily with meals. 09/17/21  Yes Sheikh, Omair Latif, DO  apixaban (ELIQUIS) 5 MG TABS tablet Take 1 tablet (5 mg total) by mouth 2 (two) times daily. 06/22/20 08/03/20  Karoline Caldwell, PA-C    Physical Exam: BP (!) 147/88   Pulse (!) 114   Temp 97.6 F (36.4 C) (Oral)   Resp 20   Ht '5\' 8"'$  (1.727 m)   Wt 64 kg   SpO2 100%   BMI 21.45 kg/m   General: 67 y.o. year-old male well developed well nourished in no acute distress.  Alert and oriented x1. Cardiovascular: Regular rate and rhythm with no rubs or gallops.  No thyromegaly or JVD noted.  No lower extremity edema. 2/4 pulses in all 4  extremities. Respiratory: Clear to auscultation with no wheezes or rales. Good inspiratory effort. Abdomen: Soft nontender nondistended with normal bowel sounds x4 quadrants. Muskuloskeletal: No cyanosis, clubbing or edema noted bilaterally Neuro: CN II-XII intact, strength, sensation, reflexes Skin: No ulcerative lesions noted or rashes Psychiatry: Judgement and insight appear altered. Mood is appropriate for condition and setting          Labs on Admission:  Basic Metabolic Panel: Recent Labs  Lab 07/21/22 1756 07/21/22 1840  NA 139 141  K 5.3* 4.1  CL 98 97*  CO2  --  28  GLUCOSE 66* 101*  BUN 102* 88*  CREATININE 12.60* 12.52*  CALCIUM  --  8.8*   Liver Function Tests: Recent Labs  Lab 07/21/22 1840  AST 10*  ALT 14  ALKPHOS 87  BILITOT 0.8  PROT 6.5  ALBUMIN 2.9*   No results for input(s): "LIPASE", "AMYLASE" in the last 168 hours. Recent Labs  Lab 07/21/22 1715  AMMONIA 15   CBC: Recent Labs  Lab 07/21/22 1715 07/21/22 1756  WBC 2.6*  --   NEUTROABS 1.6*  --   HGB 7.0* 7.8*  HCT 20.7* 23.0*  MCV 91.6  --   PLT 103*  --    Cardiac Enzymes: No results for input(s): "CKTOTAL", "CKMB", "CKMBINDEX", "TROPONINI" in the last 168 hours.  BNP (last 3 results) Recent Labs    10/14/21 1804 12/20/21 1154  BNP 3,361.2* 2,438.0*    ProBNP (last 3 results) No results for input(s): "PROBNP" in the last 8760 hours.  CBG: Recent Labs  Lab 07/21/22 1701 07/21/22 1825  GLUCAP 56* 96    Radiological Exams on Admission: CT Head Wo Contrast  Result Date: 07/21/2022 CLINICAL DATA:  Mental status change. EXAM: CT HEAD WITHOUT CONTRAST TECHNIQUE: Contiguous axial images were obtained from the base of the skull through the vertex without intravenous contrast. RADIATION DOSE REDUCTION: This exam was performed according to the departmental dose-optimization program which includes automated exposure control, adjustment of the mA and/or kV according to patient  size and/or use of iterative reconstruction technique. COMPARISON:  Head CT 01/10/2022.  MRI brain 07/08/2022. FINDINGS: Brain: No evidence of acute infarction, hemorrhage, hydrocephalus, extra-axial collection or mass lesion/mass effect. Examination is mildly limited secondary to motion artifact. Vascular: Atherosclerotic calcifications are present within the cavernous internal carotid arteries. Skull: Normal. Negative for fracture or focal lesion. Sinuses/Orbits: No acute finding. Other: None. IMPRESSION: No acute intracranial process. Electronically Signed   By: Ronney Asters M.D.   On: 07/21/2022 17:03   DG Chest Portable 1 View  Result Date: 07/21/2022 CLINICAL DATA:  Shortness of breath EXAM: PORTABLE CHEST 1 VIEW COMPARISON:  07/03/2022 FINDINGS: Cardiomegaly. No acute airspace disease or effusion. Aortic atherosclerosis. No pneumothorax. IMPRESSION: No active disease. Cardiomegaly. Electronically Signed   By: Donavan Foil M.D.   On: 07/21/2022 16:38    EKG: I independently viewed the EKG done and my findings are as followed: Sinus tachycardia rate of 114.  Nonspecific ST-T changes.  QTc 604.  Assessment/Plan Present on Admission:  AMS (altered mental status)  Principal Problem:   AMS (altered mental status)  Acute metabolic encephalopathy, unclear etiology CT head nonacute COVID-19 screening test negative. Fall precautions Delirium precautions Treat underlying conditions  Acute blood loss anemia, suspect upper GI bleed from AVMs Baseline hemoglobin 11 Presented with hemoglobin of 7.0 and positive FOBT Hold off home aspirin until seen by GI. 1 unit PRBCs ordered to be transfused Repeat CBC post blood transfusion GI consulted by EDP, will see in the morning.  Recent history of C. difficile infection Was discharged on p.o. vancomycin Still with watery stools Add Florastor 250 mg twice daily Resume p.o. vancomycin.  Pancytopenia All 3 lines are down WBC 2.6, hemoglobin 7.0,  platelet count 103 Closely monitor. Repeat CBC in the morning post blood transfusion.  Prolonged QTc QTc on admission 12 EKG 604 Optimize magnesium and potassium levels Avoid QTc prolonging agents as able. Potassium 4.1 Obtain magnesium level.  ESRD on HD TTS Consult nephrology in the morning to resume hemodialysis inpatient. Resume home medication regimen.  Hyperlipidemia Resume home atorvastatin.  Chronic anxiety/depression Resume home Zoloft  GERD IV PPI twice daily  Hypertension Hold off home oral antihypertensives due to suspected upper GI bleed. Maintain MAP greater than 65.   Critical care  time: 65 minutes.   DVT prophylaxis: Pharmacological DVT prophylaxis on hold due to concern for upper GI bleed.  SCDs.  Code Status: Full code  Family Communication: None at bedside  Disposition Plan: Admitted to progressive care unit  Consults called: GI, Dr. Carlean Purl, consulted by EDP.  Admission status: Inpatient status.   Status is: Inpatient The patient requires at least 2 midnights for further evaluation and treatment of present condition.   Kayleen Memos MD [facility] Pager [phone]  If 7PM-7AM, please contact night-coverage [email or web address] Password [facility]  07/21/2022, 9:17 PM

## 2022-07-21 NOTE — ED Provider Notes (Addendum)
[facility] EMERGENCY DEPARTMENT Provider Note   CSN: [id number] Arrival date & time: 07/21/22  1517     History  Chief Complaint  Patient presents with   Altered Mental Status    Raymond [name] is a 67 y.o. male.  Patient presents the emergency room via EMS from dialysis due to altered mental status.  Patient reportedly received 2 and half hours of treatment and became altered and confused.  Patient normally has dialysis on Tuesday, Thursday, Saturday but missed last 2 appointments.  Reportedly patient is getting over [place] but it appears that the COVID diagnosis was prior to Christmas.  Patient is alert and oriented to self only.  He does appear drowsy.  Unable to perform review of systems due to patient's mental status.  Past medical history sniffer hypertension, thyroid disease, hepatitis C, COPD, peripheral artery disease, anemia, substance abuse, end-stage renal disease on hemodialysis, coronary artery disease, type 2 diabetes mellitus  HPI     Home Medications Prior to Admission medications   Medication Sig Start Date End Date Taking? Authorizing Provider  albuterol (VENTOLIN HFA) 108 (90 Base) MCG/ACT inhaler Inhale 3 puffs into the lungs daily as needed for shortness of breath or wheezing. 11/06/19   [provider]  aspirin 81 MG chewable tablet Chew 1 tablet (81 mg total) by mouth daily. 07/09/22 10/07/22  Mariel Aloe, MD  atorvastatin (LIPITOR) 40 MG tablet Take 1 tablet (40 mg total) by mouth daily. 07/09/22 10/07/22  Mariel Aloe, MD  BREO ELLIPTA 200-25 MCG/ACT AEPB Inhale 2 puffs into the lungs daily as needed (for shortness of breath). 09/17/21   [provider]  calcitRIOL (ROCALTROL) 0.5 MCG capsule Take 1 capsule (0.5 mcg total) by mouth Every Tuesday,Thursday,and Saturday with dialysis. 12/02/21   Mariel Aloe, MD  carvedilol (COREG) 12.5 MG tablet Take 2 tablets (25 mg total) by mouth every morning AND 1 tablet (12.5 mg total)  every evening. 12/29/21   Deberah Pelton, NP  cinacalcet (SENSIPAR) 30 MG tablet Take 6 tablets (180 mg total) by mouth Every Tuesday,Thursday,and Saturday with dialysis. 12/12/20   Mercy Riding, MD  Darbepoetin Alfa (ARANESP) 200 MCG/0.4ML SOSY injection Inject 0.4 mLs (200 mcg total) into the vein every Tuesday with hemodialysis. 05/25/21   France Ravens, MD  hydrALAZINE (APRESOLINE) 10 MG tablet Take 1 tablet (10 mg total) by mouth every 8 (eight) hours. 07/08/22 10/06/22  Mariel Aloe, MD  isosorbide mononitrate (IMDUR) 30 MG 24 hr tablet Take 1/2 tablet (15 mg total) by mouth daily. 07/09/22 10/07/22  Mariel Aloe, MD  multivitamin (RENA-VIT) TABS tablet Take 1 tablet by mouth at bedtime. 09/17/21   Sheikh, Omair Latif, DO  pantoprazole (PROTONIX) 40 MG tablet Take 1 tablet (40 mg total) by mouth 2 (two) times daily before a meal. Patient taking differently: Take 40 mg by mouth daily. 09/17/21   Raiford Noble Latif, DO  sertraline (ZOLOFT) 25 MG tablet Take 25 mg by mouth daily. 12/07/21   [provider]  sevelamer carbonate (RENVELA) 800 MG tablet Take 5 tablets (4,000 mg total) by mouth 3 (three) times daily with meals. 09/17/21   Raiford Noble Latif, DO  apixaban (ELIQUIS) 5 MG TABS tablet Take 1 tablet (5 mg total) by mouth 2 (two) times daily. 06/22/20 08/03/20  Karoline Caldwell, PA-C      Allergies    Patient has no known allergies.    Review of Systems   Review of Systems  Reason unable to perform ROS: Patient alert only to person.    Physical Exam Updated Vital Signs BP (!) 145/87   Pulse (!) 115   Temp 97.7 F (36.5 C) (Oral)   Resp 14   Ht '5\' 8"'$  (1.727 m)   Wt 64 kg   SpO2 99%   BMI 21.45 kg/m  Physical Exam Vitals and nursing note reviewed.  Constitutional:      General: He is not in acute distress.    Appearance: He is well-developed.  HENT:     Head: Normocephalic and atraumatic.  Eyes:     Conjunctiva/sclera: Conjunctivae normal.  Cardiovascular:      Rate and Rhythm: Normal rate and regular rhythm.     Heart sounds: No murmur heard. Pulmonary:     Effort: Pulmonary effort is normal. No respiratory distress.     Breath sounds: Normal breath sounds.  Abdominal:     Palpations: Abdomen is soft.     Tenderness: There is no abdominal tenderness.  Musculoskeletal:        General: No swelling.     Cervical back: Neck supple.  Skin:    General: Skin is warm and dry.     Capillary Refill: Capillary refill takes less than 2 seconds.  Neurological:     Mental Status: He is alert.     Comments: Patient alert to person, able to commands move all extremities  Psychiatric:        Mood and Affect: Mood normal.     ED Results / Procedures / Treatments   Labs (all labs ordered are listed, but only abnormal results are displayed) Labs Reviewed  CBC WITH DIFFERENTIAL/PLATELET - Abnormal; Notable for the following components:      Result Value   WBC 2.6 (*)    RBC 2.26 (*)    Hemoglobin 7.0 (*)    HCT 20.7 (*)    RDW 17.3 (*)    Platelets 103 (*)    Neutro Abs 1.6 (*)    Lymphs Abs 0.6 (*)    All other components within normal limits  CBG MONITORING, ED - Abnormal; Notable for the following components:   Glucose-Capillary 56 (*)    All other components within normal limits  I-STAT CHEM 8, ED - Abnormal; Notable for the following components:   Potassium 5.3 (*)    BUN 102 (*)    Creatinine, Ser 12.60 (*)    Glucose, Bld 66 (*)    Calcium, Ion 1.01 (*)    Hemoglobin 7.8 (*)    HCT 23.0 (*)    All other components within normal limits  POC OCCULT BLOOD, ED - Abnormal; Notable for the following components:   Fecal Occult Bld POSITIVE (*)    All other components within normal limits  RESP PANEL BY RT-PCR (RSV, FLU A&B, COVID)  RVPGX2  AMMONIA  LACTIC ACID, PLASMA  URINALYSIS, ROUTINE W REFLEX MICROSCOPIC  ETHANOL  COMPREHENSIVE METABOLIC PANEL  CBG MONITORING, ED    EKG None  Radiology CT Head Wo Contrast  Result Date:  07/21/2022 CLINICAL DATA:  Mental status change. EXAM: CT HEAD WITHOUT CONTRAST TECHNIQUE: Contiguous axial images were obtained from the base of the skull through the vertex without intravenous contrast. RADIATION DOSE REDUCTION: This exam was performed according to the departmental dose-optimization program which includes automated exposure control, adjustment of the mA and/or kV according to patient size and/or use of iterative reconstruction technique. COMPARISON:  Head CT 01/10/2022.  MRI brain 07/08/2022. FINDINGS: Brain:  No evidence of acute infarction, hemorrhage, hydrocephalus, extra-axial collection or mass lesion/mass effect. Examination is mildly limited secondary to motion artifact. Vascular: Atherosclerotic calcifications are present within the cavernous internal carotid arteries. Skull: Normal. Negative for fracture or focal lesion. Sinuses/Orbits: No acute finding. Other: None. IMPRESSION: No acute intracranial process. Electronically Signed   By: Ronney Asters M.D.   On: 07/21/2022 17:03   DG Chest Portable 1 View  Result Date: 07/21/2022 CLINICAL DATA:  Shortness of breath EXAM: PORTABLE CHEST 1 VIEW COMPARISON:  07/03/2022 FINDINGS: Cardiomegaly. No acute airspace disease or effusion. Aortic atherosclerosis. No pneumothorax. IMPRESSION: No active disease. Cardiomegaly. Electronically Signed   By: Donavan Foil M.D.   On: 07/21/2022 16:38    Procedures .Critical Care  Performed by: Dorothyann Peng, PA-C Authorized by: Dorothyann Peng, PA-C   Critical care provider statement:    Critical care time (minutes):  30   Critical care was necessary to treat or prevent imminent or life-threatening deterioration of the following conditions:  Circulatory failure   Critical care was time spent personally by me on the following activities:  Development of treatment plan with patient or surrogate, discussions with consultants, evaluation of patient's response to treatment, examination of  patient, ordering and review of laboratory studies, ordering and review of radiographic studies, ordering and performing treatments and interventions, pulse oximetry, re-evaluation of patient's condition and review of old charts     Medications Ordered in ED Medications  dextrose 50 % solution 50 mL (50 mLs Intravenous Given 07/21/22 1715)    ED Course/ Medical Decision Making/ A&P Clinical Course as of 07/21/22 1850  Thu Jul 21, 2022  1642 This is a 67 year old male with a history of paroxysmal A-fib, COPD, end-stage renal disease on dialysis, presenting from dialysis with altered mental status.  Patient cannot provide any history on arrival as he appears confused.  He had been hospitalized in December my review of medical records for metabolic encephalopathy, had metabolic derangement at the time.  He also had asymptomatic COVID infection at the time.  He was diagnosed with heart failure at the time.  He was also diagnosed with C. difficile and treated with antibiotics at that time, vancomycin. [MT]  2876 On exam today the patient is confused, oriented only to himself.  He is able to follow commands and move all extremities.  He is tachycardic with A-fib on telemetry, heart rate 100 120 bpm.  He is mildly hypertensive.  He is on 2 L nasal cannula.  He is not in respiratory distress.  The differential is broad at this time and could include metabolic derangement versus CVA versus other.  He is pending labs and CT imaging of the head.  I do not see any evidence of LVO based on initial presentation. [MT]    Clinical Course User Index [MT] Trifan, Carola Rhine, MD                           Medical Decision Making Amount and/or Complexity of Data Reviewed Labs: ordered. Radiology: ordered.  Risk Prescription drug management.   This patient presents to the ED for concern of altered mental status, this involves an extensive number of treatment options, and is a complaint that carries with it a  high risk of complications and morbidity.  The differential diagnosis includes encephalopathy, electrolyte abnormality, CVA, and others.   Co morbidities that complicate the patient evaluation  History of admissions for encephalopathy, type 2  diabetes, substance abuse   Additional history obtained:  Additional history obtained from EMS External records from outside source obtained and reviewed including discharge summary from December 20, patient with principal problem of hyperkalemia, active problems including C. difficile, COVID-19 infection, hypertension, type 2 diabetes, COPD   Lab Tests:  I Ordered, and personally interpreted labs.  The pertinent results include: Hemoglobin 7, WBC 2.6, initial CBG 56, fecal occult test positive   Imaging Studies ordered:  I ordered imaging studies including chest x-ray and CT head without contrast I independently visualized and interpreted imaging which showed no active disease on chest x-ray.  CT head with no acute process I agree with the radiologist interpretation   Problem List / ED Course / Critical interventions / Medication management   I ordered medication including D50  for hypoglycemia  Reevaluation of the patient after these medicines showed that the patient improved 1 unit PRBC ordered I have reviewed the patients home medicines and have made adjustments as needed  I discussed the case with Dr.Gessner, GI, who states that the patient has history of needing endoscopic cauterization. Plans to see patient in the morning unless condition changes overnight.   Test / Admission - Considered:  Patient will need admission likely due to encephalopathy.  Patient has a significant hemoglobin drop over the past 2 weeks.  Hemoglobin 2 weeks ago was 11.7 is currently 7.0.  He has a positive fecal occult test.  Gastroenterology has been paged.  The patient was also hypoglycemic upon arrival which improved with D50 administration.  Patient is  still confused and altered at this time and only alert to person.  Patient care being transferred to [place] at shift handoff.  Admission pending results of remaining labs and callback from gastroenterology.        Final Clinical Impression(s) / ED Diagnoses Final diagnoses:  Anemia, unspecified type  Gastrointestinal hemorrhage, unspecified gastrointestinal hemorrhage type  Altered mental status, unspecified altered mental status type    Rx / DC Orders ED Discharge Orders     None         Ronny Bacon 07/21/22 1853    Dorothyann Peng, PA-C 07/21/22 1903    Wyvonnia Dusky, MD 07/21/22 2159

## 2022-07-21 NOTE — ED Triage Notes (Addendum)
Pt BIB EMS from dialysis, received 2.5 hrs of treatment and then became altered and confused. Usually goes for dialysis Tue, Thurs, Sat but missed last 3 appointments. Getting over [place]. AOX1. Drowsy with EMS

## 2022-07-21 NOTE — ED Notes (Signed)
Discussed taking dialysis clamp off pt's arm with Trifan, MD at this time. MD states that the clamp can be removed.

## 2022-07-21 NOTE — ED Notes (Signed)
Blood consent signed at this time.

## 2022-07-22 ENCOUNTER — Encounter (HOSPITAL_COMMUNITY)
Admission: EM | Disposition: A | Discharge status: Home / Self Care | Payer: Self-pay | Source: Home / Self Care | Attending: Internal Medicine

## 2022-07-22 ENCOUNTER — Encounter (HOSPITAL_COMMUNITY): Payer: Self-pay | Admitting: Internal Medicine

## 2022-07-22 ENCOUNTER — Inpatient Hospital Stay (HOSPITAL_COMMUNITY): Payer: Medicare HMO | Admitting: Anesthesiology

## 2022-07-22 DIAGNOSIS — D62 Acute posthemorrhagic anemia: Secondary | ICD-10-CM | POA: Diagnosis not present

## 2022-07-22 DIAGNOSIS — E1129 Type 2 diabetes mellitus with other diabetic kidney complication: Secondary | ICD-10-CM

## 2022-07-22 DIAGNOSIS — K922 Gastrointestinal hemorrhage, unspecified: Secondary | ICD-10-CM

## 2022-07-22 DIAGNOSIS — I480 Paroxysmal atrial fibrillation: Secondary | ICD-10-CM

## 2022-07-22 DIAGNOSIS — F17210 Nicotine dependence, cigarettes, uncomplicated: Secondary | ICD-10-CM

## 2022-07-22 DIAGNOSIS — K5521 Angiodysplasia of colon with hemorrhage: Secondary | ICD-10-CM | POA: Diagnosis not present

## 2022-07-22 DIAGNOSIS — E1151 Type 2 diabetes mellitus with diabetic peripheral angiopathy without gangrene: Secondary | ICD-10-CM

## 2022-07-22 DIAGNOSIS — K3189 Other diseases of stomach and duodenum: Secondary | ICD-10-CM | POA: Diagnosis not present

## 2022-07-22 DIAGNOSIS — K5520 Angiodysplasia of colon without hemorrhage: Secondary | ICD-10-CM | POA: Diagnosis not present

## 2022-07-22 DIAGNOSIS — R4182 Altered mental status, unspecified: Secondary | ICD-10-CM | POA: Diagnosis not present

## 2022-07-22 DIAGNOSIS — J449 Chronic obstructive pulmonary disease, unspecified: Secondary | ICD-10-CM

## 2022-07-22 DIAGNOSIS — D649 Anemia, unspecified: Secondary | ICD-10-CM | POA: Diagnosis not present

## 2022-07-22 DIAGNOSIS — K921 Melena: Secondary | ICD-10-CM | POA: Diagnosis not present

## 2022-07-22 DIAGNOSIS — K31811 Angiodysplasia of stomach and duodenum with bleeding: Secondary | ICD-10-CM

## 2022-07-22 HISTORY — PX: HOT HEMOSTASIS: SHX5433

## 2022-07-22 HISTORY — PX: ENTEROSCOPY: SHX5533

## 2022-07-22 LAB — RENAL FUNCTION PANEL
Albumin: 2.7 g/dL — ABNORMAL LOW (ref 3.5–5.0)
Albumin: 2.7 g/dL — ABNORMAL LOW (ref 3.5–5.0)
Anion gap: 14 (ref 5–15)
Anion gap: 15 (ref 5–15)
BUN: 90 mg/dL — ABNORMAL HIGH (ref 8–23)
BUN: 94 mg/dL — ABNORMAL HIGH (ref 8–23)
CO2: 26 mmol/L (ref 22–32)
CO2: 26 mmol/L (ref 22–32)
Calcium: 8.2 mg/dL — ABNORMAL LOW (ref 8.9–10.3)
Calcium: 8.1 mg/dL — ABNORMAL LOW (ref 8.9–10.3)
Chloride: 99 mmol/L (ref 98–111)
Chloride: 98 mmol/L (ref 98–111)
Creatinine, Ser: 12.84 mg/dL — ABNORMAL HIGH (ref 0.61–1.24)
Creatinine, Ser: 13.5 mg/dL — ABNORMAL HIGH (ref 0.61–1.24)
GFR, Estimated: 4 mL/min — ABNORMAL LOW (ref >60)
GFR, Estimated: 4 mL/min — ABNORMAL LOW (ref >60)
Glucose, Bld: 98 mg/dL (ref 70–99)
Glucose, Bld: 81 mg/dL (ref 70–99)
Phosphorus: 6.7 mg/dL — ABNORMAL HIGH (ref 2.5–4.6)
Phosphorus: 7 mg/dL — ABNORMAL HIGH (ref 2.5–4.6)
Potassium: 5 mmol/L (ref 3.5–5.1)
Potassium: 5.1 mmol/L (ref 3.5–5.1)
Sodium: 139 mmol/L (ref 135–145)
Sodium: 139 mmol/L (ref 135–145)

## 2022-07-22 LAB — CBC
HCT: 22.6 % — ABNORMAL LOW (ref 39.0–52.0)
HCT: 26.6 % — ABNORMAL LOW (ref 39.0–52.0)
Hemoglobin: 7.4 g/dL — ABNORMAL LOW (ref 13.0–17.0)
Hemoglobin: 8.8 g/dL — ABNORMAL LOW (ref 13.0–17.0)
MCH: 30.3 pg (ref 26.0–34.0)
MCH: 29.5 pg (ref 26.0–34.0)
MCHC: 32.7 g/dL (ref 30.0–36.0)
MCHC: 33.1 g/dL (ref 30.0–36.0)
MCV: 92.6 fL (ref 80.0–100.0)
MCV: 89.3 fL (ref 80.0–100.0)
Platelets: 89 10*3/uL — ABNORMAL LOW (ref 150–400)
Platelets: 87 10*3/uL — ABNORMAL LOW (ref 150–400)
RBC: 2.44 MIL/uL — ABNORMAL LOW (ref 4.22–5.81)
RBC: 2.98 MIL/uL — ABNORMAL LOW (ref 4.22–5.81)
RDW: 17.9 % — ABNORMAL HIGH (ref 11.5–15.5)
RDW: 19.1 % — ABNORMAL HIGH (ref 11.5–15.5)
WBC: 2.8 10*3/uL — ABNORMAL LOW (ref 4.0–10.5)
WBC: 3 10*3/uL — ABNORMAL LOW (ref 4.0–10.5)
nRBC: 0 % (ref 0.0–0.2)
nRBC: 0 % (ref 0.0–0.2)

## 2022-07-22 LAB — GLUCOSE, CAPILLARY: Glucose-Capillary: 81 mg/dL (ref 70–99)

## 2022-07-22 LAB — MAGNESIUM: Magnesium: 1.8 mg/dL (ref 1.7–2.4)

## 2022-07-22 LAB — PREPARE RBC (CROSSMATCH)

## 2022-07-22 SURGERY — ENTEROSCOPY
Anesthesia: Monitor Anesthesia Care

## 2022-07-22 MED ORDER — VANCOMYCIN HCL 125 MG PO CAPS: 125.0000 mg | ORAL_CAPSULE | Freq: Two times a day (BID) | ORAL | Status: DC

## 2022-07-22 MED ORDER — CARVEDILOL 12.5 MG PO TABS
12.5000 mg | ORAL_TABLET | Freq: Two times a day (BID) | ORAL | Status: DC
  Administered 2022-07-22 – 2022-07-24 (×4): 12.5 mg via ORAL
  Filled 2022-07-22 (×4): qty 1

## 2022-07-22 MED ORDER — PROPOFOL 10 MG/ML IV BOLUS
INTRAVENOUS | Status: DC | PRN
  Administered 2022-07-22: 80 mg via INTRAVENOUS

## 2022-07-22 MED ORDER — LABETALOL HCL 5 MG/ML IV SOLN
INTRAVENOUS | Status: AC
  Filled 2022-07-22: qty 4

## 2022-07-22 MED ORDER — VANCOMYCIN HCL 125 MG PO CAPS: 125.0000 mg | ORAL_CAPSULE | ORAL | Status: DC

## 2022-07-22 MED ORDER — SODIUM CHLORIDE 0.9% IV SOLUTION: Freq: Once | INTRAVENOUS | Status: DC

## 2022-07-22 MED ORDER — LABETALOL HCL 5 MG/ML IV SOLN
10.0000 mg | Freq: Once | INTRAVENOUS | Status: AC
  Administered 2022-07-22: 10 mg via INTRAVENOUS

## 2022-07-22 MED ORDER — PROPOFOL 500 MG/50ML IV EMUL
INTRAVENOUS | Status: DC | PRN
  Administered 2022-07-22: 150 ug/kg/min via INTRAVENOUS

## 2022-07-22 MED ORDER — PROCHLORPERAZINE EDISYLATE 10 MG/2ML IJ SOLN: 5.0000 mg | Freq: Four times a day (QID) | INTRAMUSCULAR | Status: DC | PRN

## 2022-07-22 MED ORDER — VANCOMYCIN HCL 125 MG PO CAPS
125.0000 mg | ORAL_CAPSULE | Freq: Four times a day (QID) | ORAL | Status: DC
  Administered 2022-07-22 – 2022-07-24 (×8): 125 mg via ORAL
  Filled 2022-07-22 (×14): qty 1

## 2022-07-22 MED ORDER — PHENYLEPHRINE 80 MCG/ML (10ML) SYRINGE FOR IV PUSH (FOR BLOOD PRESSURE SUPPORT)
PREFILLED_SYRINGE | INTRAVENOUS | Status: DC | PRN
  Administered 2022-07-22: 80 ug via INTRAVENOUS

## 2022-07-22 MED ORDER — PHENYLEPHRINE HCL-NACL 20-0.9 MG/250ML-% IV SOLN
INTRAVENOUS | Status: DC | PRN
  Administered 2022-07-22: 75 ug/min via INTRAVENOUS

## 2022-07-22 MED ORDER — VANCOMYCIN HCL 125 MG PO CAPS: 125.0000 mg | ORAL_CAPSULE | Freq: Every day | ORAL | Status: DC

## 2022-07-22 MED ORDER — ACETAMINOPHEN 325 MG PO TABS
650.0000 mg | ORAL_TABLET | Freq: Four times a day (QID) | ORAL | Status: DC | PRN
  Administered 2022-07-23: 650 mg via ORAL
  Filled 2022-07-22: qty 2

## 2022-07-22 MED ORDER — LIDOCAINE 2% (20 MG/ML) 5 ML SYRINGE
INTRAMUSCULAR | Status: DC | PRN
  Administered 2022-07-22: 100 mg via INTRAVENOUS

## 2022-07-22 MED ORDER — SODIUM CHLORIDE 0.9 % IV SOLN: INTRAVENOUS | Status: DC

## 2022-07-22 MED ORDER — MELATONIN 5 MG PO TABS
5.0000 mg | ORAL_TABLET | Freq: Every evening | ORAL | Status: DC | PRN
  Administered 2022-07-23: 5 mg via ORAL
  Filled 2022-07-22: qty 1

## 2022-07-22 NOTE — Progress Notes (Signed)
[facility] PROGRESS NOTE    Progress Note  Raymond [name]  MBT:[id number] DOB: 02-20-1956 DOA: 07/21/2022 PCP: [name], FNP     Brief Narrative:   Raymond [name] is an 67 y.o. 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 difficile infection comes into the ED due to altered mental status at dialysis center, in the ED hemoglobin was 7 (from 11.72 weeks prior) FOBT positive, persistent diarrhea 5 episodes of dark watery stools was transfused 1 unit of packed red blood cells in the ED he also had acute metabolic encephalopathy, CT of the head showed no acute findings, white count of 2.6, platelet count of 103    Assessment/Plan:   Acute metabolic encephalopathy: CT of the head negative SARS-CoV-2 PCR and influenza are negative. Question due to drop in hemoglobin. Status transfusion his mentation is significantly improved, he relates he feels better.  Acute blood loss anemia/anemia of chronic renal disease/history of AVMs: Baseline hemoglobin around 11, on admission 7.0, FOBT positive. Aspirin was held he was transfused 1 unit packed red blood cells. Repeat hemoglobin is 7.4, we will go ahead and transfuse an additional unit packed red blood cells. GI has been consulted.  Recent history of C. difficile infection: Continues to have watery stools continue Florasto and vancomycin will finish his treatment in house.  Pancytopenia: All 3 cell lines are down. Will go ahead and transfuse another unit of packed red blood cells, his white blood count is 2.6 question due to infectious etiology.  He continues to have watery diarrhea more than 5 stools a day.  Prolonged QTc: Potassium is 5.0 magnesium is  1.8. Continue monitor electrolytes and optimize.  End-stage renal disease on hemodialysis Tuesday Thursday and Saturdays: Nephrology has been notified to resume HD.  Continue current home regimen.  Hyperlipidemia: Continue statins.  Chronic anxiety/depression: Continue Zoloft.  GERD: Continue IV PPI twice a day.  Essential hypertension: Due to suspected GI bleed all antihypertensive medications were held, blood pressures relatively stable but he is mildly tachycardic.   DVT prophylaxis: scd Family Communication:none Status is: Inpatient Remains inpatient appropriate because: Altered mental status likely due to GI bleed    Code Status:     Code Status Orders  (From admission, onward)           Start     Ordered   07/22/22 0051  Full code  Continuous       Question:  By:  Answer:  Consent: discussion documented in EHR   07/22/22 0050           Code Status History     Date Active Date Inactive Code Status Order ID Comments User Context   07/03/2022 2142 07/08/2022 2229 Full Code [id number]  Orene Desanctis, DO ED   12/20/2021 1600 12/22/2021 2235 Full Code [id number]  Karmen Bongo, MD ED   11/11/2021 1243 12/01/2021 2050 Full Code [id number]  Lequita Halt, MD ED   09/13/2021 2213 09/17/2021 1941 Full Code [id number]  Eugenie Filler, MD Inpatient   05/18/2021 1631 05/26/2021 0403 Full Code [id number]  Gaylan Gerold, DO ED   04/03/2021 0134 04/15/2021 2221 Full Code [id number]  Mick Sell, PA-C ED   01/25/2021 1909 01/29/2021 1558 Full Code [id number]  Elodia Florence., MD ED  01/19/2021 2213 01/20/2021 2147 Full Code [id number]  Kayleen Memos, DO ED   12/09/2020 1449 12/11/2020 1831 Full Code [id number]  Lequita Halt, MD ED   07/28/2020 2015 08/04/2020 0007 Full Code [id number]  Howerter, Ethelda Chick, DO ED   06/15/2020 1845 06/22/2020 2139 Full Code [id number]  Ulyses Amor, PA-C Inpatient   06/15/2020 1845 06/15/2020 1845 Full Code [id number]  Waynetta Sandy, MD  Inpatient   01/14/2020 0805 01/19/2020 1036 Full Code [id number]  Norval Morton, MD ED   12/24/2018 2104 12/27/2018 2134 Full Code [id number]  Lenore Cordia, MD ED   09/12/2018 0209 09/14/2018 2032 Full Code [id number]  Etta Quill, DO ED   08/18/2018 1827 08/22/2018 1421 Full Code [id number]  Florencia Reasons, MD ED   08/10/2018 0309 08/13/2018 1935 Full Code [id number]  Shela Leff, MD ED   08/02/2018 0127 08/04/2018 1945 Full Code [id number]  Etta Quill, DO ED   07/13/2018 0802 07/15/2018 1751 Full Code [id number]  Phillips Grout, MD ED   04/11/2018 1926 04/21/2018 1918 Full Code [id number]  Gabriel Earing, PA-C Inpatient   03/31/2018 2014 04/03/2018 1457 Full Code [id number]  Patrecia Pour, MD Inpatient   03/29/2018 1632 03/29/2018 2312 Full Code [id number]  End, Harrell Gave, MD Inpatient   02/08/2018 1348 02/08/2018 2031 Full Code [id number]  Conrad McDowell, MD Inpatient   08/07/2017 1804 08/08/2017 1659 Full Code [id number]  Ledell Noss, MD ED   06/20/2017 1947 06/22/2017 1803 Full Code [id number]  Burgess Estelle, MD Inpatient   12/05/2016 1257 12/06/2016 1717 Full Code [id number]  Shela Leff, MD Inpatient   12/19/2015 0339 12/22/2015 2042 Full Code [id number]  Norval Morton, MD Inpatient         IV Access:   Peripheral IV   Procedures and diagnostic studies:   CT Head Wo Contrast  Result Date: 07/21/2022 CLINICAL DATA:  Mental status change. EXAM: CT HEAD WITHOUT CONTRAST TECHNIQUE: Contiguous axial images were obtained from the base of the skull through the vertex without intravenous contrast. RADIATION DOSE REDUCTION: This exam was performed according to the departmental dose-optimization program which includes automated exposure control, adjustment of the mA and/or kV according to patient size and/or use of iterative reconstruction technique. COMPARISON:  Head CT 01/10/2022.  MRI brain 07/08/2022. FINDINGS: Brain: No evidence of acute infarction, hemorrhage, hydrocephalus, extra-axial collection or  mass lesion/mass effect. Examination is mildly limited secondary to motion artifact. Vascular: Atherosclerotic calcifications are present within the cavernous internal carotid arteries. Skull: Normal. Negative for fracture or focal lesion. Sinuses/Orbits: No acute finding. Other: None. IMPRESSION: No acute intracranial process. Electronically Signed   By: Ronney Asters M.D.   On: 07/21/2022 17:03   DG Chest Portable 1 View  Result Date: 07/21/2022 CLINICAL DATA:  Shortness of breath EXAM: PORTABLE CHEST 1 VIEW COMPARISON:  07/03/2022 FINDINGS: Cardiomegaly. No acute airspace disease or effusion. Aortic atherosclerosis. No pneumothorax. IMPRESSION: No active disease. Cardiomegaly. Electronically Signed   By: Donavan Foil M.D.   On: 07/21/2022 16:38     Medical Consultants:   None.   Subjective:    Deanna E Veno has only had 1 bowel movement in house.  He relates he had melanotic stools for 4 days  Objective:    Vitals:   07/22/22 0415 07/22/22 0455 07/22/22 0500 07/22/22 0545  BP: 134/62  (!) 117/90 (!) 130/104  Pulse: (!) 118 (!) 104 (!) 103 (!) 118  Resp: 18 12 (!) 22 (!) 26  Temp:  TempSrc:      SpO2: 98% 100% 100% 100%  Weight:      Height:       SpO2: 100 % O2 Flow Rate (L/min): 2 L/min   Intake/Output Summary (Last 24 hours) at 07/22/2022 0655 Last data filed at 07/22/2022 0020 Gross per 24 hour  Intake 315 ml  Output --  Net 315 ml   Filed Weights   07/21/22 1736  Weight: 64 kg    Exam: General exam: In no acute distress. Respiratory system: Good air movement and clear to auscultation. Cardiovascular system: S1 & S2 heard, RRR. No JVD. Gastrointestinal system: Abdomen is nondistended, soft and nontender.  Extremities: No pedal edema. Skin: No rashes, lesions or ulcers Psychiatry: Judgement and insight appear normal. Mood & affect appropriate.    Data Reviewed:    Labs: Basic Metabolic Panel: Recent Labs  Lab 07/21/22 1756 07/21/22 1840  07/22/22 0239  NA 139 141 139  K 5.3* 4.1 5.0  CL 98 97* 99  CO2  --  28 26  GLUCOSE 66* 101* 98  BUN 102* 88* 90*  CREATININE 12.60* 12.52* 12.84*  CALCIUM  --  8.8* 8.2*  MG  --   --  1.8  PHOS  --   --  6.7*   GFR Estimated Creatinine Clearance: 5 mL/min (A) (by C-G formula based on SCr of 12.84 mg/dL (H)). Liver Function Tests: Recent Labs  Lab 07/21/22 1840 07/22/22 0239  AST 10*  --   ALT 14  --   ALKPHOS 87  --   BILITOT 0.8  --   PROT 6.5  --   ALBUMIN 2.9* 2.7*   No results for input(s): "LIPASE", "AMYLASE" in the last 168 hours. Recent Labs  Lab 07/21/22 1715  AMMONIA 15   Coagulation profile No results for input(s): "INR", "PROTIME" in the last 168 hours. COVID-19 Labs  No results for input(s): "DDIMER", "FERRITIN", "LDH", "CRP" in the last 72 hours.  Lab Results  Component Value Date   SARSCOV2NAA NEGATIVE 07/21/2022   SARSCOV2NAA POSITIVE (A) 07/03/2022   SARSCOV2NAA NEGATIVE 09/13/2021   [place] NEGATIVE 05/25/2021    CBC: Recent Labs  Lab 07/21/22 1715 07/21/22 1756 07/22/22 0239  WBC 2.6*  --  2.8*  NEUTROABS 1.6*  --   --   HGB 7.0* 7.8* 7.4*  HCT 20.7* 23.0* 22.6*  MCV 91.6  --  92.6  PLT 103*  --  89*   Cardiac Enzymes: No results for input(s): "CKTOTAL", "CKMB", "CKMBINDEX", "TROPONINI" in the last 168 hours. BNP (last 3 results) No results for input(s): "PROBNP" in the last 8760 hours. CBG: Recent Labs  Lab 07/21/22 1701 07/21/22 1825  GLUCAP 56* 96   D-Dimer: No results for input(s): "DDIMER" in the last 72 hours. Hgb A1c: No results for input(s): "HGBA1C" in the last 72 hours. Lipid Profile: No results for input(s): "CHOL", "HDL", "LDLCALC", "TRIG", "CHOLHDL", "LDLDIRECT" in the last 72 hours. Thyroid function studies: No results for input(s): "TSH", "T4TOTAL", "T3FREE", "THYROIDAB" in the last 72 hours.  Invalid input(s): "FREET3" Anemia work up: No results for input(s): "VITAMINB12", "FOLATE", "FERRITIN",  "TIBC", "IRON", "RETICCTPCT" in the last 72 hours. Sepsis Labs: Recent Labs  Lab 07/21/22 1715 07/21/22 1718 07/22/22 0239  WBC 2.6*  --  2.8*  LATICACIDVEN  --  1.2  --    Microbiology Recent Results (from the past 240 hour(s))  Resp panel by RT-PCR (RSV, Flu A&B, Covid) Anterior Nasal Swab     Status: None  Collection Time: 07/21/22  5:50 PM   Specimen: Anterior Nasal Swab  Result Value Ref Range Status   SARS Coronavirus 2 by RT PCR NEGATIVE NEGATIVE Final    Comment: (NOTE) SARS-CoV-2 target nucleic acids are NOT DETECTED.  The SARS-CoV-2 RNA is generally detectable in upper respiratory specimens during the acute phase of infection. The lowest concentration of SARS-CoV-2 viral copies this assay can detect is 138 copies/mL. A negative result does not preclude SARS-Cov-2 infection and should not be used as the sole basis for treatment or other patient management decisions. A negative result may occur with  improper specimen collection/handling, submission of specimen other than nasopharyngeal swab, presence of viral mutation(s) within the areas targeted by this assay, and inadequate number of viral copies(<138 copies/mL). A negative result must be combined with clinical observations, patient history, and epidemiological information. The expected result is Negative.  Fact Sheet for Patients:  EntrepreneurPulse.com.au  Fact Sheet for Healthcare Providers:  IncredibleEmployment.be  This test is no t yet approved or cleared by the [place] FDA and  has been authorized for detection and/or diagnosis of SARS-CoV-2 by FDA under an Emergency Use Authorization (EUA). This EUA will remain  in effect (meaning this test can be used) for the duration of the COVID-19 declaration under Section 564(b)(1) of the Act, 21 U.S.C.section 360bbb-3(b)(1), unless the authorization is terminated  or revoked sooner.       Influenza A by PCR NEGATIVE  NEGATIVE Final   Influenza B by PCR NEGATIVE NEGATIVE Final    Comment: (NOTE) The Xpert Xpress SARS-CoV-2/FLU/RSV plus assay is intended as an aid in the diagnosis of influenza from Nasopharyngeal swab specimens and should not be used as a sole basis for treatment. Nasal washings and aspirates are unacceptable for Xpert Xpress SARS-CoV-2/FLU/RSV testing.  Fact Sheet for Patients: EntrepreneurPulse.com.au  Fact Sheet for Healthcare Providers: IncredibleEmployment.be  This test is not yet approved or cleared by the [place] FDA and has been authorized for detection and/or diagnosis of SARS-CoV-2 by FDA under an Emergency Use Authorization (EUA). This EUA will remain in effect (meaning this test can be used) for the duration of the COVID-19 declaration under Section 564(b)(1) of the Act, 21 U.S.C. section 360bbb-3(b)(1), unless the authorization is terminated or revoked.     Resp Syncytial Virus by PCR NEGATIVE NEGATIVE Final    Comment: (NOTE) Fact Sheet for Patients: EntrepreneurPulse.com.au  Fact Sheet for Healthcare Providers: IncredibleEmployment.be  This test is not yet approved or cleared by the [place] FDA and has been authorized for detection and/or diagnosis of SARS-CoV-2 by FDA under an Emergency Use Authorization (EUA). This EUA will remain in effect (meaning this test can be used) for the duration of the COVID-19 declaration under Section 564(b)(1) of the Act, 21 U.S.C. section 360bbb-3(b)(1), unless the authorization is terminated or revoked.  Performed at [facility], [place] [address] [place], [place] [address]      Medications:    pantoprazole (PROTONIX) IV  40 mg Intravenous BID   saccharomyces boulardii  250 mg Oral BID   vancomycin  125 mg Oral QID   Followed by   Derrill Memo ON 08/05/2022] vancomycin  125 mg Oral BID   Followed by   Derrill Memo ON 08/12/2022] vancomycin   125 mg Oral Daily   Followed by   Derrill Memo ON 08/19/2022] vancomycin  125 mg Oral QODAY   Followed by   Derrill Memo ON 08/27/2022] vancomycin  125 mg Oral Q3 days   Continuous Infusions:  LOS: 1 day   Charlynne Cousins  [facility]  07/22/2022, 6:55 AM

## 2022-07-22 NOTE — Anesthesia Preprocedure Evaluation (Addendum)
Anesthesia Evaluation  Patient identified by MRN, date of birth, ID band Patient awake    Reviewed: Allergy & Precautions, NPO status , Patient's Chart, lab work & pertinent test results  Airway Mallampati: II  TM Distance: >3 FB Neck ROM: Full    Dental  (+) Poor Dentition, Missing   Pulmonary COPD,  COPD inhaler, Current Smoker and Patient abstained from smoking.   Pulmonary exam normal        Cardiovascular hypertension, Pt. on medications and Pt. on home beta blockers + CAD, + Peripheral Vascular Disease and +CHF   Rhythm:Regular Rate:Normal     Neuro/Psych    Depression    negative neurological ROS     GI/Hepatic ,GERD  Medicated,,(+) Hepatitis -  Endo/Other  diabetes    Renal/GU   negative genitourinary   Musculoskeletal negative musculoskeletal ROS (+)    Abdominal Normal abdominal exam  (+)   Peds  Hematology  (+) Blood dyscrasia, anemia   Anesthesia Other Findings   Reproductive/Obstetrics                             Anesthesia Physical Anesthesia Plan  ASA: 3  Anesthesia Plan: MAC   Post-op Pain Management:    Induction: Intravenous  PONV Risk Score and Plan: 1 and Propofol infusion and Treatment may vary due to age or medical condition  Airway Management Planned: Simple Face Mask, Natural Airway and Nasal Cannula  Additional Equipment: None  Intra-op Plan:   Post-operative Plan:   Informed Consent: I have reviewed the patients History and Physical, chart, labs and discussed the procedure including the risks, benefits and alternatives for the proposed anesthesia with the patient or authorized representative who has indicated his/her understanding and acceptance.     Dental advisory given  Plan Discussed with: CRNA  Anesthesia Plan Comments:        Anesthesia Quick Evaluation

## 2022-07-22 NOTE — Transfer of Care (Signed)
Immediate Anesthesia Transfer of Care Note  Patient: Raymond [name]  Procedure(s) Performed: ENTEROSCOPY HOT HEMOSTASIS (ARGON PLASMA COAGULATION/BICAP)  Patient Location: PACU  Anesthesia Type:MAC  Level of Consciousness: awake  Airway & Oxygen Therapy: Patient Spontanous Breathing and Patient connected to nasal cannula oxygen  Post-op Assessment: Report given to RN and Post -op Vital signs reviewed and stable  Post vital signs: Reviewed and stable  Last Vitals:  Vitals Value Taken Time  BP 120/86 07/22/22 1715  Temp    Pulse 103 07/22/22 1715  Resp 19 07/22/22 1715  SpO2 100 % 07/22/22 1715  Vitals shown include unvalidated device data.  Last Pain:  Vitals:   07/22/22 1009  TempSrc: Oral  PainSc:          Complications: No notable events documented.

## 2022-07-22 NOTE — Anesthesia Postprocedure Evaluation (Signed)
Anesthesia Post Note  Patient: Raymond [name]  Procedure(s) Performed: ENTEROSCOPY HOT HEMOSTASIS (ARGON PLASMA COAGULATION/BICAP)     Patient location during evaluation: Endoscopy Anesthesia Type: MAC Level of consciousness: awake and alert Pain management: pain level controlled Vital Signs Assessment: post-procedure vital signs reviewed and stable Respiratory status: spontaneous breathing, nonlabored ventilation, respiratory function stable and patient connected to nasal cannula oxygen Cardiovascular status: blood pressure returned to baseline and stable Postop Assessment: no apparent nausea or vomiting Anesthetic complications: no   No notable events documented.  Last Vitals:  Vitals:   07/22/22 1715 07/22/22 1730  BP: 120/86 (!) 136/99  Pulse: (!) 103 (!) 104  Resp: 19 17  Temp: 36.6 C   SpO2: 100% 100%    Last Pain:  Vitals:   07/22/22 1715  TempSrc:   PainSc: [place]

## 2022-07-22 NOTE — ED Notes (Signed)
MD Opyd notified of pt's consistent HR of 110s Afib rhythm. Pt has no complaints.

## 2022-07-22 NOTE — ED Notes (Signed)
Pt to Endoscopy at this time.

## 2022-07-22 NOTE — ED Notes (Signed)
GI at bedside

## 2022-07-22 NOTE — Op Note (Signed)
[facility] Patient Name: Raymond [name] Procedure Date : 07/22/2022 MRN: [id number] Attending MD: Estill Cotta. Loletha Carrow , MD, [id number] Date of Birth: 1955/11/30 CSN: [id number] Age: 67 Admit Type: Inpatient Procedure:                Small bowel enteroscopy Indications:              Acute post hemorrhagic anemia, Melena                           ESRD on HD - Hx SB AVMs                           > 4 gram Hgb drop in 2 weeks after recent                            hospitalization with C diff Providers:                Mallie Mussel L. Loletha Carrow, MD, Fanny Skates RN, RN, Doristine Johns, RN, Gloris Ham, Technician Referring MD:             [facility] Medicines:                Monitored Anesthesia Care Complications:            No immediate complications. Estimated Blood Loss:     Estimated blood loss: none. Procedure:                Pre-Anesthesia Assessment:                           - Prior to the procedure, a History and Physical                            was performed, and patient medications and                            allergies were reviewed. The patient's tolerance of                            previous anesthesia was also reviewed. The risks                            and benefits of the procedure and the sedation                            options and risks were discussed with the patient.                            All questions were answered, and informed consent                            was obtained. Prior Anticoagulants: The patient has  taken no anticoagulant or antiplatelet agents. ASA                            Grade Assessment: IV - A patient with severe                            systemic disease that is a constant threat to life.                            After reviewing the risks and benefits, the patient                            was deemed in satisfactory condition to undergo the                             procedure.                           After obtaining informed consent, the endoscope was                            passed under direct vision. Throughout the                            procedure, the patient's blood pressure, pulse, and                            oxygen saturations were monitored continuously. The                            [id number]-HQ190L ([id number]) Olympus colonoscope was                            introduced through the mouth and advanced to the                            proximal jejunum. The small bowel enteroscopy was                            accomplished without difficulty. The patient                            tolerated the procedure well. Scope In: Scope Out: Findings:      The esophagus was normal.      Congested mucosa was found in the entire examined stomach.      Congested mucosa without active bleeding and with no stigmata of       bleeding was found in the duodenal bulb.      Two angioectasias with no bleeding were found in the proximal jejunum.       Coagulation for hemostasis using argon plasma at 0.5 liters/minute and       20 watts was successful. Impression:               - Normal esophagus.                           -  Congestive gastropathy.                           - Congested duodenal mucosa.                           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                            Treated with argon plasma coagulation (APC).                           - No specimens collected. Recommendation:           - Return patient to hospital ward for ongoing care.                           - Low sodium diet.                           - Check CBC Q 8 hrs and Tfx as needed.                           GI to follow (Dr. Benson Norway this weekend)                           If ongoing overt GI bleeding and declinig Hgb, plan                            colonoscopy. Procedure Code(s):        --- Professional ---                           [phone], Small  intestinal endoscopy, enteroscopy                            beyond second portion of duodenum, not including                            ileum; with control of bleeding (eg, injection,                            bipolar cautery, unipolar cautery, laser, heater                            probe, stapler, plasma coagulator) Diagnosis Code(s):        --- Professional ---                           K31.89, Other diseases of stomach and duodenum                           K55.20, Angiodysplasia of colon without hemorrhage                           D62, Acute posthemorrhagic anemia  K92.1, Melena (includes Hematochezia) CPT copyright 2022 American Medical Association. All rights reserved. The codes documented in this report are preliminary and upon coder review may  be revised to meet current compliance requirements. Coralyn Roselli L. Loletha Carrow, MD 07/22/2022 5:09:48 PM This report has been signed electronically. Number of Addenda: 0

## 2022-07-22 NOTE — H&P (View-Only) (Signed)
Consultation  Referring Provider:   Dr. Aileen Fass Primary Care Physician:  [name], FNP Primary Gastroenterologist: Dr. Havery Moros       Reason for Consultation:   Anemia with heme positive stool         HPI:   Raymond [name] is a 67 y.o. male with a past medical history significant for ESRD on HD Tuesday, Thursday and Saturday, paroxysmal A-fib/A-flutter not on oral anticoagulation, chronic systolic CHF, PAD on Aspirin 81 mg daily, history of AVMs, COPD, secondary hyperparathyroidism, who presented to the ER on 07/21/2022 from a group home through the hemodialysis center for altered mental status.  We are consulted in regards to anemia with heme positive stool.    06/29/2022-07/08/2022 patient admitted to the hospital for C. difficile infection and electrolyte derangement.    Apparently after 2 and half hours into the hemodialysis session he started exhibiting abnormal behavior like shouting randomly in the room.  At time of admission endorsed persistent diarrhea with 5 episodes of dark watery stools yesterday morning.    At time of interview today patient is doing fairly well.  He tells me that he has been seeing dark watery stools since being discharged from the hospital on the 29th.  Apparently followed with his PCP who told him to start taking Imodium, he ran out of the Vancomycin, continues with watery stools which have been mostly black/dark over the past couple of weeks.  Increasing shortness of breath and fatigue.  Denies abdominal pain.    Denies fever, chills or weight loss.     ED course: Acute blood loss anemia with a hemoglobin from 11.7 down to 7.0 over the past 2 weeks, positive FOBT, hemoglobin 7.0, platelet count 103, COVID, RSV and flu negative  GI history: 02/01/2021 small bowel enteroscopy for melena and history of AVMs in the stomach and small intestine with 2 diminutive AVM in the antrum treated with APC, 2 small AVMs in the gastric body treated with APC, 6  small AVMs in the duodenal bulb treated with APC, 4 AVMs in the proximal jejunum and third portion of the duodenum treated with APC 01/27/2021 small bowel endoscopy 08/04/2020 small bowel endoscopy 07/31/2020 small bowel endoscopy 01/17/2020 small bowel endoscopy 08/21/2018 colonoscopy with one 3 mm polyp in the ascending colon, one 5 mm polyp in the transverse colon, diverticulosis in the transverse and left colon internal hemorrhoids  Past Medical History:  Diagnosis Date   Anemia    CAD (coronary artery disease)    a. 03/2018: cath showing 20 to 30% stenosis along the LAD, luminal irregularities along the RCA, and angiographically normal LCx   COPD (chronic obstructive pulmonary disease) ([place])    ESRD (end stage renal disease) on dialysis ([place])    "MWF; [facility] R[facility]" (07/22/17)   Hepatitis C    Still positive s/p liver biopsy at [facility]  and interferon therapy for 6 months. Most recent lab work was on 10/24/12   Hepatitis C    "took the tx; gone now" (12/05/2016)  Was treated   History of blood transfusion ~ 2012/2013   "related to my kidneys; blood was low"   Hypertension    Peripheral arterial disease ([place])    Substance abuse ([place])    Thyroid disease    Type 2 diabetes mellitus with other diabetic kidney complication ([place]) [id number]/93/7169    Past Surgical History:  Procedure Laterality Date   ABDOMINAL AORTOGRAM W/LOWER EXTREMITY N/A 02/08/2018   Procedure: ABDOMINAL AORTOGRAM  W/LOWER EXTREMITY;  Surgeon: Conrad Cumming, MD;  Location: [place] [facility];  Service: Cardiovascular;  Laterality: N/A;   ABDOMINAL AORTOGRAM W/LOWER EXTREMITY Bilateral 06/15/2020   Procedure: ABDOMINAL AORTOGRAM W/LOWER EXTREMITY;  Surgeon: Waynetta Sandy, MD;  Location: [place] [facility];  Service: Cardiovascular;  Laterality: Bilateral;   AV FISTULA PLACEMENT Left Aug. 2013 ?   BIOPSY  01/17/2020   Procedure: BIOPSY;  Surgeon: Jackquline Denmark, MD;  Location: [place];  Service: Endoscopy;;   COLONOSCOPY  WITH PROPOFOL N/A 08/21/2018   Procedure: COLONOSCOPY WITH PROPOFOL;  Surgeon: Yetta Flock, MD;  Location: [place];  Service: Gastroenterology;  Laterality: N/A;   ENTEROSCOPY N/A 01/17/2020   Procedure: ENTEROSCOPY;  Surgeon: Jackquline Denmark, MD;  Location: [facility] ENDOSCOPY;  Service: Endoscopy;  Laterality: N/A;   ENTEROSCOPY N/A 07/31/2020   Procedure: ENTEROSCOPY;  Surgeon: Doran Stabler, MD;  Location: [place];  Service: Gastroenterology;  Laterality: N/A;   ENTEROSCOPY N/A 01/27/2021   Procedure: ENTEROSCOPY;  Surgeon: Yetta Flock, MD;  Location[facility] ENDOSCOPY;  Service: Gastroenterology;  Laterality: N/A;   ESOPHAGOGASTRODUODENOSCOPY (EGD) WITH PROPOFOL N/A 08/20/2018   Procedure: ESOPHAGOGASTRODUODENOSCOPY (EGD) WITH PROPOFOL;  Surgeon: Gatha Mayer, MD;  Location: [place];  Service: Endoscopy;  Laterality: N/A;   FEMORAL-POPLITEAL BYPASS GRAFT Left 04/11/2018   Procedure: BYPASS GRAFT FEMORAL-POPLITEAL ARTERY LEFT LEG;  Surgeon: Rosetta Posner, MD;  Location: [facility] OR;  Service: Vascular;  Laterality: Left;   GIVENS CAPSULE STUDY N/A 07/31/2020   Procedure: GIVENS CAPSULE STUDY;  Surgeon: Doran Stabler, MD;  Location: [place];  Service: Gastroenterology;  Laterality: N/A;   HOT HEMOSTASIS N/A 08/20/2018   Procedure: HOT HEMOSTASIS (ARGON PLASMA COAGULATION/BICAP);  Surgeon: Gatha Mayer, MD;  Location: [facility] ENDOSCOPY;  Service: Endoscopy;  Laterality: N/A;   HOT HEMOSTASIS N/A 01/17/2020   Procedure: HOT HEMOSTASIS (ARGON PLASMA COAGULATION/BICAP);  Surgeon: Jackquline Denmark, MD;  Location: [facility] ENDOSCOPY;  Service: Endoscopy;  Laterality: N/A;   HOT HEMOSTASIS N/A 01/27/2021   Procedure: HOT HEMOSTASIS (ARGON PLASMA COAGULATION/BICAP);  Surgeon: Yetta Flock, MD;  Location: [facility] ENDOSCOPY;  Service: Gastroenterology;  Laterality: N/A;   LEFT HEART CATH AND CORONARY ANGIOGRAPHY N/A 03/29/2018   Procedure: LEFT HEART CATH AND CORONARY ANGIOGRAPHY;  Surgeon:  Nelva Bush, MD;  Location: [place] [facility];  Service: Cardiovascular;  Laterality: N/A;   LIVER BIOPSY     ORIF ULNAR FRACTURE Left 05/18/2017   Procedure: OPEN REDUCTION INTERNAL FIXATION (ORIF) ULNAR FRACTURE;  Surgeon: Iran Planas, MD;  Location: [place];  Service: Orthopedics;  Laterality: Left;   PERIPHERAL VASCULAR INTERVENTION Right 06/15/2020   Procedure: PERIPHERAL VASCULAR INTERVENTION;  Surgeon: Waynetta Sandy, MD;  Location: [place] [facility];  Service: Cardiovascular;  Laterality: Right;   POLYPECTOMY  08/21/2018   Procedure: POLYPECTOMY;  Surgeon: Yetta Flock, MD;  Location: [place];  Service: Gastroenterology;;   Earney Mallet N/A 12/11/2012   Procedure: Earney Mallet;  Surgeon: Serafina Mitchell, MD;  Location: [facility] CATH LAB;  Service: Cardiovascular;  Laterality: N/A;   WOUND EXPLORATION Left 06/15/2020   Procedure: GROIN  EXPLORATION;  Surgeon: Waynetta Sandy, MD;  Location: [place];  Service: Vascular;  Laterality: Left;    Family History  Problem Relation Age of Onset   Diabetes Father    Hypertension Father    Heart disease Father     Social History   Tobacco Use   Smoking status: Every Day    Packs/day: 1.00  Years: 25.00    Total pack years: 25.00    Types: Cigarettes    Last attempt to quit: 08/01/2011    Years since quitting: 10.9   Smokeless tobacco: Never   Tobacco comments:    he smokes 3 cigarettes a day  Vaping Use   Vaping Use: Never used  Substance Use Topics   Alcohol use: Yes    Alcohol/week: 10.0 standard drinks of alcohol    Types: 10 Glasses of wine per week    Comment: drank ETOH Saturday, 8 ounces vodka   Drug use: Not Currently    Types: Cocaine    Comment: not lately    Prior to Admission medications   Medication Sig Start Date End Date Taking? Authorizing Provider  albuterol (VENTOLIN HFA) 108 (90 Base) MCG/ACT inhaler Inhale 3 puffs into the lungs daily as needed for shortness of breath or  wheezing. 11/06/19  Yes [provider]  aspirin 81 MG chewable tablet Chew 1 tablet (81 mg total) by mouth daily. 07/09/22 10/07/22 Yes Mariel Aloe, MD  atorvastatin (LIPITOR) 40 MG tablet Take 1 tablet (40 mg total) by mouth daily. 07/09/22 10/07/22 Yes Mariel Aloe, MD  BREO ELLIPTA 200-25 MCG/ACT AEPB Inhale 2 puffs into the lungs daily as needed (for shortness of breath). 09/17/21  Yes [provider]  calcitRIOL (ROCALTROL) 0.5 MCG capsule Take 1 capsule (0.5 mcg total) by mouth Every Tuesday,Thursday,and Saturday with dialysis. 12/02/21  Yes Mariel Aloe, MD  carvedilol (COREG) 12.5 MG tablet Take 2 tablets (25 mg total) by mouth every morning AND 1 tablet (12.5 mg total) every evening. 12/29/21  Yes Cleaver, Jossie Ng, NP  cinacalcet (SENSIPAR) 30 MG tablet Take 6 tablets (180 mg total) by mouth Every Tuesday,Thursday,and Saturday with dialysis. 12/12/20  Yes Mercy Riding, MD  Darbepoetin Alfa (ARANESP) 200 MCG/0.4ML SOSY injection Inject 0.4 mLs (200 mcg total) into the vein every Tuesday with hemodialysis. 05/25/21  Yes France Ravens, MD  hydrALAZINE (APRESOLINE) 10 MG tablet Take 1 tablet (10 mg total) by mouth every 8 (eight) hours. 07/08/22 10/06/22 Yes Mariel Aloe, MD  isosorbide mononitrate (IMDUR) 30 MG 24 hr tablet Take 1/2 tablet (15 mg total) by mouth daily. 07/09/22 10/07/22 Yes Mariel Aloe, MD  loperamide (IMODIUM) 2 MG capsule Take 2 mg by mouth as needed for diarrhea or loose stools. 07/18/22  Yes [provider]  metroNIDAZOLE (FLAGYL) 500 MG tablet Take 500 mg by mouth 3 (three) times daily. 07/18/22  Yes [provider]  multivitamin (RENA-VIT) TABS tablet Take 1 tablet by mouth at bedtime. 09/17/21  Yes Sheikh, Omair Latif, DO  pantoprazole (PROTONIX) 40 MG tablet Take 1 tablet (40 mg total) by mouth 2 (two) times daily before a meal. Patient taking differently: Take 40 mg by mouth daily. 09/17/21  Yes Sheikh, Omair Latif, DO  sertraline  (ZOLOFT) 25 MG tablet Take 25 mg by mouth daily. 12/07/21  Yes [provider]  sevelamer carbonate (RENVELA) 800 MG tablet Take 5 tablets (4,000 mg total) by mouth 3 (three) times daily with meals. 09/17/21  Yes Sheikh, Omair Latif, DO  apixaban (ELIQUIS) 5 MG TABS tablet Take 1 tablet (5 mg total) by mouth 2 (two) times daily. 06/22/20 08/03/20  Karoline Caldwell, PA-C    Current Facility-Administered Medications  Medication Dose Route Frequency Provider Last Rate Last Admin   acetaminophen (TYLENOL) tablet 650 mg  650 mg Oral Q6H PRN Kayleen Memos, DO  carvedilol (COREG) tablet 12.5 mg  12.5 mg Oral BID WC Charlynne Cousins, MD   12.5 mg at 07/22/22 0732   melatonin tablet 5 mg  5 mg Oral QHS PRN Irene Pap N, DO       pantoprazole (PROTONIX) injection 40 mg  40 mg Intravenous BID Irene Pap N, DO   40 mg at 07/21/22 2203   prochlorperazine (COMPAZINE) injection 5 mg  5 mg Intravenous Q6H PRN Kayleen Memos, DO       saccharomyces boulardii (FLORASTOR) capsule 250 mg  250 mg Oral BID Irene Pap N, DO   250 mg at 07/21/22 2219   vancomycin (VANCOCIN) capsule 125 mg  125 mg Oral QID Kayleen Memos, DO       Followed by   Derrill Memo ON 08/05/2022] vancomycin (VANCOCIN) capsule 125 mg  125 mg Oral BID Irene Pap N, DO       Followed by   Derrill Memo ON 08/12/2022] vancomycin (VANCOCIN) capsule 125 mg  125 mg Oral Daily Irene Pap N, DO       Followed by   Derrill Memo ON 08/19/2022] vancomycin (VANCOCIN) capsule 125 mg  125 mg Oral QODAY Hall, Carole N, DO       Followed by   Derrill Memo ON 08/27/2022] vancomycin (VANCOCIN) capsule 125 mg  125 mg Oral Q3 days Kayleen Memos, DO       Current Outpatient Medications  Medication Sig Dispense Refill   albuterol (VENTOLIN HFA) 108 (90 Base) MCG/ACT inhaler Inhale 3 puffs into the lungs daily as needed for shortness of breath or wheezing.     aspirin 81 MG chewable tablet Chew 1 tablet (81 mg total) by mouth daily. 30 tablet 2   atorvastatin  (LIPITOR) 40 MG tablet Take 1 tablet (40 mg total) by mouth daily. 30 tablet 2   BREO ELLIPTA 200-25 MCG/ACT AEPB Inhale 2 puffs into the lungs daily as needed (for shortness of breath).     calcitRIOL (ROCALTROL) 0.5 MCG capsule Take 1 capsule (0.5 mcg total) by mouth Every Tuesday,Thursday,and Saturday with dialysis.     carvedilol (COREG) 12.5 MG tablet Take 2 tablets (25 mg total) by mouth every morning AND 1 tablet (12.5 mg total) every evening. 45 tablet 6   cinacalcet (SENSIPAR) 30 MG tablet Take 6 tablets (180 mg total) by mouth Every Tuesday,Thursday,and Saturday with dialysis. 60 tablet 1   Darbepoetin Alfa (ARANESP) 200 MCG/0.4ML SOSY injection Inject 0.4 mLs (200 mcg total) into the vein every Tuesday with hemodialysis. 1.68 mL    hydrALAZINE (APRESOLINE) 10 MG tablet Take 1 tablet (10 mg total) by mouth every 8 (eight) hours. 90 tablet 2   isosorbide mononitrate (IMDUR) 30 MG 24 hr tablet Take 1/2 tablet (15 mg total) by mouth daily. 15 tablet 2   loperamide (IMODIUM) 2 MG capsule Take 2 mg by mouth as needed for diarrhea or loose stools.     metroNIDAZOLE (FLAGYL) 500 MG tablet Take 500 mg by mouth 3 (three) times daily.     multivitamin (RENA-VIT) TABS tablet Take 1 tablet by mouth at bedtime.  0   pantoprazole (PROTONIX) 40 MG tablet Take 1 tablet (40 mg total) by mouth 2 (two) times daily before a meal. (Patient taking differently: Take 40 mg by mouth daily.)     sertraline (ZOLOFT) 25 MG tablet Take 25 mg by mouth daily.     sevelamer carbonate (RENVELA) 800 MG tablet Take 5 tablets (4,000 mg total) by mouth 3 (three) times  daily with meals.     Facility-Administered Medications Ordered in Other Encounters  Medication Dose Route Frequency Provider Last Rate Last Admin   midazolam (VERSED) 5 MG/5ML injection    Anesthesia Intra-op Sammie Bench, CRNA   2 mg at 04/11/18 1042    Allergies as of 07/21/2022   (No Known Allergies)     Review of Systems:    Constitutional:  No weight loss, fever or chills Skin: No rash  Cardiovascular: No chest pain Respiratory: +DOE Gastrointestinal: See HPI and otherwise negative Genitourinary: No dysuria Neurological: No headache, dizziness or syncope Musculoskeletal: No new muscle or joint pain Hematologic: No bruising Psychiatric: No history of depression or anxiety    Physical Exam:  Vital signs in last 24 hours: Temp:  [97.6 F (36.4 C)-98.2 F (36.8 C)] 98.2 F (36.8 C) (01/12 0727) Pulse Rate:  [34-118] 118 (01/12 0727) Resp:  [12-26] 16 (01/12 0727) BP: (108-159)/(62-104) 143/97 (01/12 0727) SpO2:  [95 %-100 %] 100 % (01/12 0727) Weight:  [64 kg] 64 kg (01/11 1736)   General:   Pleasant AA male appears to be in NAD, Well developed, Well nourished, alert and cooperative Head:  Normocephalic and atraumatic. Eyes:   PEERL, EOMI. No icterus. Conjunctiva pink. Ears:  Normal auditory acuity. Neck:  Supple Throat: Oral cavity and pharynx without inflammation, swelling or lesion.  Lungs: Respirations even and unlabored. Lungs clear to auscultation bilaterally.   No wheezes, crackles, or rhonchi.  Heart: Normal S1, S2. No MRG. Regular rate and rhythm. No peripheral edema, cyanosis or pallor.  Abdomen:  Soft, nondistended, nontender. No rebound or guarding. Normal bowel sounds. No appreciable masses or hepatomegaly. Rectal:  Not performed.  Msk:  Symmetrical without gross deformities. Peripheral pulses intact.  Extremities:  Without edema, no deformity or joint abnormality. Normal ROM, normal sensation. Neurologic:  Alert and  oriented x4;   Skin:   Dry and intact without significant lesions or rashes. Psychiatric: Demonstrates good judgement and reason without abnormal affect or behaviors.   LAB RESULTS: Recent Labs    07/21/22 1715 07/21/22 1756 07/22/22 0239  WBC 2.6*  --  2.8*  HGB 7.0* 7.8* 7.4*  HCT 20.7* 23.0* 22.6*  PLT 103*  --  89*   BMET Recent Labs    07/21/22 1756 07/21/22 1840  07/22/22 0239  NA 139 141 139  K 5.3* 4.1 5.0  CL 98 97* 99  CO2  --  28 26  GLUCOSE 66* 101* 98  BUN 102* 88* 90*  CREATININE 12.60* 12.52* 12.84*  CALCIUM  --  8.8* 8.2*   LFT Recent Labs    07/21/22 1840 07/22/22 0239  PROT 6.5  --   ALBUMIN 2.9* 2.7*  AST 10*  --   ALT 14  --   ALKPHOS 87  --   BILITOT 0.8  --    STUDIES: CT Head Wo Contrast  Result Date: 07/21/2022 CLINICAL DATA:  Mental status change. EXAM: CT HEAD WITHOUT CONTRAST TECHNIQUE: Contiguous axial images were obtained from the base of the skull through the vertex without intravenous contrast. RADIATION DOSE REDUCTION: This exam was performed according to the departmental dose-optimization program which includes automated exposure control, adjustment of the mA and/or kV according to patient size and/or use of iterative reconstruction technique. COMPARISON:  Head CT 01/10/2022.  MRI brain 07/08/2022. FINDINGS: Brain: No evidence of acute infarction, hemorrhage, hydrocephalus, extra-axial collection or mass lesion/mass effect. Examination is mildly limited secondary to motion artifact. Vascular: Atherosclerotic calcifications are present within  the cavernous internal carotid arteries. Skull: Normal. Negative for fracture or focal lesion. Sinuses/Orbits: No acute finding. Other: None. IMPRESSION: No acute intracranial process. Electronically Signed   By: Ronney Asters M.D.   On: 07/21/2022 17:03   DG Chest Portable 1 View  Result Date: 07/21/2022 CLINICAL DATA:  Shortness of breath EXAM: PORTABLE CHEST 1 VIEW COMPARISON:  07/03/2022 FINDINGS: Cardiomegaly. No acute airspace disease or effusion. Aortic atherosclerosis. No pneumothorax. IMPRESSION: No active disease. Cardiomegaly. Electronically Signed   By: Donavan Foil M.D.   On: 07/21/2022 16:38      Impression / Plan:   Impression: 1.  Acute on chronic anemia: --Hemoglobin 11--> 7.0 over the past 2 weeks, FOBT positive --> 1 unit PRBCs --> 7.4, known history of  small bowel AVMs previously treated, the last in 2022, likely the same 2.  ESRD on HD 3.  Altered mental status: Unclear etiology, CT head nonacute 4.  Recent history of C. difficile infection: Discharged on p.o. Vancomycin, still with watery stools, Florastor added to 50 twice daily by hospitalist team and patient placed back on Vancomycin 5.  Pancytopenia 6.  Prolonged QTc 7.  GERD: Currently on IV PPI twice daily  Plan: 1.  Will plan for enteroscopy this afternoon with Dr. Lyndel Safe.  Patient's last sip of clears was a little before 9:00 this morning.  Will be n.p.o. now.  Did discuss risks, benefits, limitations and alternatives and patient agrees to proceed. 2.  Per nursing staff another unit of blood was recommended but not ordered.  Will go ahead and order this now. 3.  Continue to monitor hemoglobin and transfusion as needed less than 7 4.  Continue Pantoprazole 40 mg IV twice daily 5.  Agree with restarting Vancomycin for prolonged taper given recurrence of diarrhea.  Did discuss this with the patient today. 6.  Please await further recommendations after time of procedure later today.  Thank you for your kind consultation, we will continue to follow.  Lavone Nian [facility]  07/22/2022, 8:38 AM  I have taken an interval history, thoroughly reviewed the chart and examined the patient. I agree with the Advanced Practitioner's note, impression and recommendations, and have recorded additional findings, impressions and recommendations below. I performed a substantive portion of this encounter (>50% time spent), including a complete performance of the medical decision making.  My additional thoughts are as follows:  67 year old man with multiple medical issues as noted above known to our practice for recurrent small bowel AVM bleeding.  Admitted with probable melena and significant recent drop in hemoglobin.  Transfused, currently hemodynamically stable.  Plan is for small bowel  enteroscopy today.  He was agreeable after discussion of procedure and risks.  The benefits and risks of the planned procedure were described in detail with the patient or (when appropriate) their health care proxy.  Risks were outlined as including, but not limited to, bleeding, infection, perforation, adverse medication reaction leading to cardiac or pulmonary decompensation, pancreatitis (if ERCP).  The limitation of incomplete mucosal visualization was also discussed.  No guarantees or warranties were given.  Patient at increased risk for cardiopulmonary complications of procedure due to medical comorbidities.    [name] Office:[phone]

## 2022-07-22 NOTE — Interval H&P Note (Signed)
History and Physical Interval Note:  07/22/2022 4:42 PM  Raymond [name]  has presented today for surgery, with the diagnosis of Acute on chronic anemia, History of AVM in SB.  The various methods of treatment have been discussed with the patient and family. After consideration of risks, benefits and other options for treatment, the patient has consented to  Procedure(s): ENTEROSCOPY (N/A) as a surgical intervention.  The patient's history has been reviewed, patient examined, no change in status, stable for surgery.  I have reviewed the patient's chart and labs.  Questions were answered to the patient's satisfaction.     [name]

## 2022-07-22 NOTE — Consult Note (Addendum)
Consultation  Referring Provider:   Dr. Aileen Fass Primary Care Physician:  [name], FNP Primary Gastroenterologist: Dr. Havery Moros       Reason for Consultation:   Anemia with heme positive stool         HPI:   Raymond [name] is a 67 y.o. male with a past medical history significant for ESRD on HD Tuesday, Thursday and Saturday, paroxysmal A-fib/A-flutter not on oral anticoagulation, chronic systolic CHF, PAD on Aspirin 81 mg daily, history of AVMs, COPD, secondary hyperparathyroidism, who presented to the ER on 07/21/2022 from a group home through the hemodialysis center for altered mental status.  We are consulted in regards to anemia with heme positive stool.    06/29/2022-07/08/2022 patient admitted to the hospital for C. difficile infection and electrolyte derangement.    Apparently after 2 and half hours into the hemodialysis session he started exhibiting abnormal behavior like shouting randomly in the room.  At time of admission endorsed persistent diarrhea with 5 episodes of dark watery stools yesterday morning.    At time of interview today patient is doing fairly well.  He tells me that he has been seeing dark watery stools since being discharged from the hospital on the 29th.  Apparently followed with his PCP who told him to start taking Imodium, he ran out of the Vancomycin, continues with watery stools which have been mostly black/dark over the past couple of weeks.  Increasing shortness of breath and fatigue.  Denies abdominal pain.    Denies fever, chills or weight loss.     ED course: Acute blood loss anemia with a hemoglobin from 11.7 down to 7.0 over the past 2 weeks, positive FOBT, hemoglobin 7.0, platelet count 103, COVID, RSV and flu negative  GI history: 02/01/2021 small bowel enteroscopy for melena and history of AVMs in the stomach and small intestine with 2 diminutive AVM in the antrum treated with APC, 2 small AVMs in the gastric body treated with APC, 6  small AVMs in the duodenal bulb treated with APC, 4 AVMs in the proximal jejunum and third portion of the duodenum treated with APC 01/27/2021 small bowel endoscopy 08/04/2020 small bowel endoscopy 07/31/2020 small bowel endoscopy 01/17/2020 small bowel endoscopy 08/21/2018 colonoscopy with one 3 mm polyp in the ascending colon, one 5 mm polyp in the transverse colon, diverticulosis in the transverse and left colon internal hemorrhoids  Past Medical History:  Diagnosis Date   Anemia    CAD (coronary artery disease)    a. 03/2018: cath showing 20 to 30% stenosis along the LAD, luminal irregularities along the RCA, and angiographically normal LCx   COPD (chronic obstructive pulmonary disease) ([place])    ESRD (end stage renal disease) on dialysis ([place])    "MWF; [facility]" (07/22/17)   Hepatitis C    Still positive s/p liver biopsy at [facility]  and interferon therapy for 6 months. Most recent lab work was on 10/24/12   Hepatitis C    "took the tx; gone now" (12/05/2016)  Was treated   History of blood transfusion ~ 2012/2013   "related to my kidneys; blood was low"   Hypertension    Peripheral arterial disease ([place])    Substance abuse ([place])    Thyroid disease    Type 2 diabetes mellitus with other diabetic kidney complication ([place]) [id number]    Past Surgical History:  Procedure Laterality Date   ABDOMINAL AORTOGRAM W/LOWER EXTREMITY N/A 02/08/2018   Procedure: ABDOMINAL AORTOGRAM  W/LOWER EXTREMITY;  Surgeon: Conrad Tunkhannock, MD;  Location: [place] [facility];  Service: Cardiovascular;  Laterality: N/A;   ABDOMINAL AORTOGRAM W/LOWER EXTREMITY Bilateral 06/15/2020   Procedure: ABDOMINAL AORTOGRAM W/LOWER EXTREMITY;  Surgeon: Waynetta Sandy, MD;  Location: [place] [facility];  Service: Cardiovascular;  Laterality: Bilateral;   AV FISTULA PLACEMENT Left Aug. 2013 ?   BIOPSY  01/17/2020   Procedure: BIOPSY;  Surgeon: Jackquline Denmark, MD;  Location: [place];  Service: Endoscopy;;   COLONOSCOPY  WITH PROPOFOL N/A 08/21/2018   Procedure: COLONOSCOPY WITH PROPOFOL;  Surgeon: Yetta Flock, MD;  Location: [employer];  Service: Gastroenterology;  Laterality: N/A;   ENTEROSCOPY N/A 01/17/2020   Procedure: ENTEROSCOPY;  Surgeon: Jackquline Denmark, MD;  Location: [facility] ENDOSCOPY;  Service: Endoscopy;  Laterality: N/A;   ENTEROSCOPY N/A 07/31/2020   Procedure: ENTEROSCOPY;  Surgeon: Doran Stabler, MD;  Location: [place];  Service: Gastroenterology;  Laterality: N/A;   ENTEROSCOPY N/A 01/27/2021   Procedure: ENTEROSCOPY;  Surgeon: Yetta Flock, MD;  Location: [facility] ENDOSCOPY;  Service: Gastroenterology;  Laterality: N/A;   ESOPHAGOGASTRODUODENOSCOPY (EGD) WITH PROPOFOL N/A 08/20/2018   Procedure: ESOPHAGOGASTRODUODENOSCOPY (EGD) WITH PROPOFOL;  Surgeon: Gatha Mayer, MD;  Location: [place];  Service: Endoscopy;  Laterality: N/A;   FEMORAL-POPLITEAL BYPASS GRAFT Left 04/11/2018   Procedure: BYPASS GRAFT FEMORAL-POPLITEAL ARTERY LEFT LEG;  Surgeon: Rosetta Posner, MD;  Location: [facility] OR;  Service: Vascular;  Laterality: Left;   GIVENS CAPSULE STUDY N/A 07/31/2020   Procedure: GIVENS CAPSULE STUDY;  Surgeon: Doran Stabler, MD;  Location: [place];  Service: Gastroenterology;  Laterality: N/A;   HOT HEMOSTASIS N/A 08/20/2018   Procedure: HOT HEMOSTASIS (ARGON PLASMA COAGULATION/BICAP);  Surgeon: Gatha Mayer, MD;  Location: [facility] ENDOSCOPY;  Service: Endoscopy;  Laterality: N/A;   HOT HEMOSTASIS N/A 01/17/2020   Procedure: HOT HEMOSTASIS (ARGON PLASMA COAGULATION/BICAP);  Surgeon: Jackquline Denmark, MD;  Location: [facility] ENDOSCOPY;  Service: Endoscopy;  Laterality: N/A;   HOT HEMOSTASIS N/A 01/27/2021   Procedure: HOT HEMOSTASIS (ARGON PLASMA COAGULATION/BICAP);  Surgeon: Yetta Flock, MD;  Location: [facility] ENDOSCOPY;  Service: Gastroenterology;  Laterality: N/A;   LEFT HEART CATH AND CORONARY ANGIOGRAPHY N/A 03/29/2018   Procedure: LEFT HEART CATH AND CORONARY ANGIOGRAPHY;  Surgeon:  Nelva Bush, MD;  Location: [place] [facility];  Service: Cardiovascular;  Laterality: N/A;   LIVER BIOPSY     ORIF ULNAR FRACTURE Left 05/18/2017   Procedure: OPEN REDUCTION INTERNAL FIXATION (ORIF) ULNAR FRACTURE;  Surgeon: Iran Planas, MD;  Location: [place];  Service: Orthopedics;  Laterality: Left;   PERIPHERAL VASCULAR INTERVENTION Right 06/15/2020   Procedure: PERIPHERAL VASCULAR INTERVENTION;  Surgeon: Waynetta Sandy, MD;  Location: [place] [facility];  Service: Cardiovascular;  Laterality: Right;   POLYPECTOMY  08/21/2018   Procedure: POLYPECTOMY;  Surgeon: Yetta Flock, MD;  Location: [place];  Service: Gastroenterology;;   Earney Mallet N/A 12/11/2012   Procedure: Earney Mallet;  Surgeon: Serafina Mitchell, MD;  Location: [facility] CATH LAB;  Service: Cardiovascular;  Laterality: N/A;   WOUND EXPLORATION Left 06/15/2020   Procedure: GROIN  EXPLORATION;  Surgeon: Waynetta Sandy, MD;  Location: [place];  Service: Vascular;  Laterality: Left;    Family History  Problem Relation Age of Onset   Diabetes Father    Hypertension Father    Heart disease Father     Social History   Tobacco Use   Smoking status: Every Day    Packs/day: 1.00  Years: 25.00    Total pack years: 25.00    Types: Cigarettes    Last attempt to quit: 08/01/2011    Years since quitting: 10.9   Smokeless tobacco: Never   Tobacco comments:    he smokes 3 cigarettes a day  Vaping Use   Vaping Use: Never used  Substance Use Topics   Alcohol use: Yes    Alcohol/week: 10.0 standard drinks of alcohol    Types: 10 Glasses of wine per week    Comment: drank ETOH Saturday, 8 ounces vodka   Drug use: Not Currently    Types: Cocaine    Comment: not lately    Prior to Admission medications   Medication Sig Start Date End Date Taking? Authorizing Provider  albuterol (VENTOLIN HFA) 108 (90 Base) MCG/ACT inhaler Inhale 3 puffs into the lungs daily as needed for shortness of breath or  wheezing. 11/06/19  Yes [provider]  aspirin 81 MG chewable tablet Chew 1 tablet (81 mg total) by mouth daily. 07/09/22 10/07/22 Yes Mariel Aloe, MD  atorvastatin (LIPITOR) 40 MG tablet Take 1 tablet (40 mg total) by mouth daily. 07/09/22 10/07/22 Yes Mariel Aloe, MD  BREO ELLIPTA 200-25 MCG/ACT AEPB Inhale 2 puffs into the lungs daily as needed (for shortness of breath). 09/17/21  Yes [provider]  calcitRIOL (ROCALTROL) 0.5 MCG capsule Take 1 capsule (0.5 mcg total) by mouth Every Tuesday,Thursday,and Saturday with dialysis. 12/02/21  Yes Mariel Aloe, MD  carvedilol (COREG) 12.5 MG tablet Take 2 tablets (25 mg total) by mouth every morning AND 1 tablet (12.5 mg total) every evening. 12/29/21  Yes Cleaver, Jossie Ng, NP  cinacalcet (SENSIPAR) 30 MG tablet Take 6 tablets (180 mg total) by mouth Every Tuesday,Thursday,and Saturday with dialysis. 12/12/20  Yes Mercy Riding, MD  Darbepoetin Alfa (ARANESP) 200 MCG/0.4ML SOSY injection Inject 0.4 mLs (200 mcg total) into the vein every Tuesday with hemodialysis. 05/25/21  Yes France Ravens, MD  hydrALAZINE (APRESOLINE) 10 MG tablet Take 1 tablet (10 mg total) by mouth every 8 (eight) hours. 07/08/22 10/06/22 Yes Mariel Aloe, MD  isosorbide mononitrate (IMDUR) 30 MG 24 hr tablet Take 1/2 tablet (15 mg total) by mouth daily. 07/09/22 10/07/22 Yes Mariel Aloe, MD  loperamide (IMODIUM) 2 MG capsule Take 2 mg by mouth as needed for diarrhea or loose stools. 07/18/22  Yes [provider]  metroNIDAZOLE (FLAGYL) 500 MG tablet Take 500 mg by mouth 3 (three) times daily. 07/18/22  Yes [provider]  multivitamin (RENA-VIT) TABS tablet Take 1 tablet by mouth at bedtime. 09/17/21  Yes Sheikh, Omair Latif, DO  pantoprazole (PROTONIX) 40 MG tablet Take 1 tablet (40 mg total) by mouth 2 (two) times daily before a meal. Patient taking differently: Take 40 mg by mouth daily. 09/17/21  Yes Sheikh, Omair Latif, DO  sertraline  (ZOLOFT) 25 MG tablet Take 25 mg by mouth daily. 12/07/21  Yes [provider]  sevelamer carbonate (RENVELA) 800 MG tablet Take 5 tablets (4,000 mg total) by mouth 3 (three) times daily with meals. 09/17/21  Yes Sheikh, Omair Latif, DO  apixaban (ELIQUIS) 5 MG TABS tablet Take 1 tablet (5 mg total) by mouth 2 (two) times daily. 06/22/20 08/03/20  Karoline Caldwell, PA-C    Current Facility-Administered Medications  Medication Dose Route Frequency Provider Last Rate Last Admin   acetaminophen (TYLENOL) tablet 650 mg  650 mg Oral Q6H PRN Kayleen Memos, DO  carvedilol (COREG) tablet 12.5 mg  12.5 mg Oral BID WC Charlynne Cousins, MD   12.5 mg at 07/22/22 0732   melatonin tablet 5 mg  5 mg Oral QHS PRN Irene Pap N, DO       pantoprazole (PROTONIX) injection 40 mg  40 mg Intravenous BID Irene Pap N, DO   40 mg at 07/21/22 2203   prochlorperazine (COMPAZINE) injection 5 mg  5 mg Intravenous Q6H PRN Kayleen Memos, DO       saccharomyces boulardii (FLORASTOR) capsule 250 mg  250 mg Oral BID Irene Pap N, DO   250 mg at 07/21/22 2219   vancomycin (VANCOCIN) capsule 125 mg  125 mg Oral QID Kayleen Memos, DO       Followed by   Derrill Memo ON 08/05/2022] vancomycin (VANCOCIN) capsule 125 mg  125 mg Oral BID Irene Pap N, DO       Followed by   Derrill Memo ON 08/12/2022] vancomycin (VANCOCIN) capsule 125 mg  125 mg Oral Daily Irene Pap N, DO       Followed by   Derrill Memo ON 08/19/2022] vancomycin (VANCOCIN) capsule 125 mg  125 mg Oral QODAY Hall, Carole N, DO       Followed by   Derrill Memo ON 08/27/2022] vancomycin (VANCOCIN) capsule 125 mg  125 mg Oral Q3 days Kayleen Memos, DO       Current Outpatient Medications  Medication Sig Dispense Refill   albuterol (VENTOLIN HFA) 108 (90 Base) MCG/ACT inhaler Inhale 3 puffs into the lungs daily as needed for shortness of breath or wheezing.     aspirin 81 MG chewable tablet Chew 1 tablet (81 mg total) by mouth daily. 30 tablet 2   atorvastatin  (LIPITOR) 40 MG tablet Take 1 tablet (40 mg total) by mouth daily. 30 tablet 2   BREO ELLIPTA 200-25 MCG/ACT AEPB Inhale 2 puffs into the lungs daily as needed (for shortness of breath).     calcitRIOL (ROCALTROL) 0.5 MCG capsule Take 1 capsule (0.5 mcg total) by mouth Every Tuesday,Thursday,and Saturday with dialysis.     carvedilol (COREG) 12.5 MG tablet Take 2 tablets (25 mg total) by mouth every morning AND 1 tablet (12.5 mg total) every evening. 45 tablet 6   cinacalcet (SENSIPAR) 30 MG tablet Take 6 tablets (180 mg total) by mouth Every Tuesday,Thursday,and Saturday with dialysis. 60 tablet 1   Darbepoetin Alfa (ARANESP) 200 MCG/0.4ML SOSY injection Inject 0.4 mLs (200 mcg total) into the vein every Tuesday with hemodialysis. 1.68 mL    hydrALAZINE (APRESOLINE) 10 MG tablet Take 1 tablet (10 mg total) by mouth every 8 (eight) hours. 90 tablet 2   isosorbide mononitrate (IMDUR) 30 MG 24 hr tablet Take 1/2 tablet (15 mg total) by mouth daily. 15 tablet 2   loperamide (IMODIUM) 2 MG capsule Take 2 mg by mouth as needed for diarrhea or loose stools.     metroNIDAZOLE (FLAGYL) 500 MG tablet Take 500 mg by mouth 3 (three) times daily.     multivitamin (RENA-VIT) TABS tablet Take 1 tablet by mouth at bedtime.  0   pantoprazole (PROTONIX) 40 MG tablet Take 1 tablet (40 mg total) by mouth 2 (two) times daily before a meal. (Patient taking differently: Take 40 mg by mouth daily.)     sertraline (ZOLOFT) 25 MG tablet Take 25 mg by mouth daily.     sevelamer carbonate (RENVELA) 800 MG tablet Take 5 tablets (4,000 mg total) by mouth 3 (three) times  daily with meals.     Facility-Administered Medications Ordered in Other Encounters  Medication Dose Route Frequency Provider Last Rate Last Admin   midazolam (VERSED) 5 MG/5ML injection    Anesthesia Intra-op Sammie Bench, CRNA   2 mg at 04/11/18 1042    Allergies as of 07/21/2022   (No Known Allergies)     Review of Systems:    Constitutional:  No weight loss, fever or chills Skin: No rash  Cardiovascular: No chest pain Respiratory: +DOE Gastrointestinal: See HPI and otherwise negative Genitourinary: No dysuria Neurological: No headache, dizziness or syncope Musculoskeletal: No new muscle or joint pain Hematologic: No bruising Psychiatric: No history of depression or anxiety    Physical Exam:  Vital signs in last 24 hours: Temp:  [97.6 F (36.4 C)-98.2 F (36.8 C)] 98.2 F (36.8 C) (01/12 0727) Pulse Rate:  [34-118] 118 (01/12 0727) Resp:  [12-26] 16 (01/12 0727) BP: (108-159)/(62-104) 143/97 (01/12 0727) SpO2:  [95 %-100 %] 100 % (01/12 0727) Weight:  [64 kg] 64 kg (01/11 1736)   General:   Pleasant AA male appears to be in NAD, Well developed, Well nourished, alert and cooperative Head:  Normocephalic and atraumatic. Eyes:   PEERL, EOMI. No icterus. Conjunctiva pink. Ears:  Normal auditory acuity. Neck:  Supple Throat: Oral cavity and pharynx without inflammation, swelling or lesion.  Lungs: Respirations even and unlabored. Lungs clear to auscultation bilaterally.   No wheezes, crackles, or rhonchi.  Heart: Normal S1, S2. No MRG. Regular rate and rhythm. No peripheral edema, cyanosis or pallor.  Abdomen:  Soft, nondistended, nontender. No rebound or guarding. Normal bowel sounds. No appreciable masses or hepatomegaly. Rectal:  Not performed.  Msk:  Symmetrical without gross deformities. Peripheral pulses intact.  Extremities:  Without edema, no deformity or joint abnormality. Normal ROM, normal sensation. Neurologic:  Alert and  oriented x4;   Skin:   Dry and intact without significant lesions or rashes. Psychiatric: Demonstrates good judgement and reason without abnormal affect or behaviors.   LAB RESULTS: Recent Labs    07/21/22 1715 07/21/22 1756 07/22/22 0239  WBC 2.6*  --  2.8*  HGB 7.0* 7.8* 7.4*  HCT 20.7* 23.0* 22.6*  PLT 103*  --  89*   BMET Recent Labs    07/21/22 1756 07/21/22 1840  07/22/22 0239  NA 139 141 139  K 5.3* 4.1 5.0  CL 98 97* 99  CO2  --  28 26  GLUCOSE 66* 101* 98  BUN 102* 88* 90*  CREATININE 12.60* 12.52* 12.84*  CALCIUM  --  8.8* 8.2*   LFT Recent Labs    07/21/22 1840 07/22/22 0239  PROT 6.5  --   ALBUMIN 2.9* 2.7*  AST 10*  --   ALT 14  --   ALKPHOS 87  --   BILITOT 0.8  --    STUDIES: CT Head Wo Contrast  Result Date: 07/21/2022 CLINICAL DATA:  Mental status change. EXAM: CT HEAD WITHOUT CONTRAST TECHNIQUE: Contiguous axial images were obtained from the base of the skull through the vertex without intravenous contrast. RADIATION DOSE REDUCTION: This exam was performed according to the departmental dose-optimization program which includes automated exposure control, adjustment of the mA and/or kV according to patient size and/or use of iterative reconstruction technique. COMPARISON:  Head CT 01/10/2022.  MRI brain 07/08/2022. FINDINGS: Brain: No evidence of acute infarction, hemorrhage, hydrocephalus, extra-axial collection or mass lesion/mass effect. Examination is mildly limited secondary to motion artifact. Vascular: Atherosclerotic calcifications are present within  the cavernous internal carotid arteries. Skull: Normal. Negative for fracture or focal lesion. Sinuses/Orbits: No acute finding. Other: None. IMPRESSION: No acute intracranial process. Electronically Signed   By: Ronney Asters M.D.   On: 07/21/2022 17:03   DG Chest Portable 1 View  Result Date: 07/21/2022 CLINICAL DATA:  Shortness of breath EXAM: PORTABLE CHEST 1 VIEW COMPARISON:  07/03/2022 FINDINGS: Cardiomegaly. No acute airspace disease or effusion. Aortic atherosclerosis. No pneumothorax. IMPRESSION: No active disease. Cardiomegaly. Electronically Signed   By: Donavan Foil M.D.   On: 07/21/2022 16:38      Impression / Plan:   Impression: 1.  Acute on chronic anemia: --Hemoglobin 11--> 7.0 over the past 2 weeks, FOBT positive --> 1 unit PRBCs --> 7.4, known history of  small bowel AVMs previously treated, the last in 2022, likely the same 2.  ESRD on HD 3.  Altered mental status: Unclear etiology, CT head nonacute 4.  Recent history of C. difficile infection: Discharged on p.o. Vancomycin, still with watery stools, Florastor added to 50 twice daily by hospitalist team and patient placed back on Vancomycin 5.  Pancytopenia 6.  Prolonged QTc 7.  GERD: Currently on IV PPI twice daily  Plan: 1.  Will plan for enteroscopy this afternoon with Dr. Lyndel Safe.  Patient's last sip of clears was a little before 9:00 this morning.  Will be n.p.o. now.  Did discuss risks, benefits, limitations and alternatives and patient agrees to proceed. 2.  Per nursing staff another unit of blood was recommended but not ordered.  Will go ahead and order this now. 3.  Continue to monitor hemoglobin and transfusion as needed less than 7 4.  Continue Pantoprazole 40 mg IV twice daily 5.  Agree with restarting Vancomycin for prolonged taper given recurrence of diarrhea.  Did discuss this with the patient today. 6.  Please await further recommendations after time of procedure later today.  Thank you for your kind consultation, we will continue to follow.  Lavone Nian [facility]  07/22/2022, 8:38 AM  I have taken an interval history, thoroughly reviewed the chart and examined the patient. I agree with the Advanced Practitioner's note, impression and recommendations, and have recorded additional findings, impressions and recommendations below. I performed a substantive portion of this encounter (>50% time spent), including a complete performance of the medical decision making.  My additional thoughts are as follows:  67 year old man with multiple medical issues as noted above known to our practice for recurrent small bowel AVM bleeding.  Admitted with probable melena and significant recent drop in hemoglobin.  Transfused, currently hemodynamically stable.  Plan is for small bowel  enteroscopy today.  He was agreeable after discussion of procedure and risks.  The benefits and risks of the planned procedure were described in detail with the patient or (when appropriate) their health care proxy.  Risks were outlined as including, but not limited to, bleeding, infection, perforation, adverse medication reaction leading to cardiac or pulmonary decompensation, pancreatitis (if ERCP).  The limitation of incomplete mucosal visualization was also discussed.  No guarantees or warranties were given.  Patient at increased risk for cardiopulmonary complications of procedure due to medical comorbidities.    [name] Office:[phone]

## 2022-07-23 DIAGNOSIS — R4182 Altered mental status, unspecified: Secondary | ICD-10-CM | POA: Diagnosis not present

## 2022-07-23 DIAGNOSIS — D649 Anemia, unspecified: Secondary | ICD-10-CM | POA: Diagnosis not present

## 2022-07-23 DIAGNOSIS — K922 Gastrointestinal hemorrhage, unspecified: Secondary | ICD-10-CM | POA: Diagnosis not present

## 2022-07-23 LAB — CBC
HCT: 23.3 % — ABNORMAL LOW (ref 39.0–52.0)
HCT: 24.6 % — ABNORMAL LOW (ref 39.0–52.0)
HCT: 22.7 % — ABNORMAL LOW (ref 39.0–52.0)
Hemoglobin: 7.9 g/dL — ABNORMAL LOW (ref 13.0–17.0)
Hemoglobin: 7.9 g/dL — ABNORMAL LOW (ref 13.0–17.0)
Hemoglobin: 7.6 g/dL — ABNORMAL LOW (ref 13.0–17.0)
MCH: 29.9 pg (ref 26.0–34.0)
MCH: 29.2 pg (ref 26.0–34.0)
MCH: 29.9 pg (ref 26.0–34.0)
MCHC: 33.9 g/dL (ref 30.0–36.0)
MCHC: 32.1 g/dL (ref 30.0–36.0)
MCHC: 33.5 g/dL (ref 30.0–36.0)
MCV: 88.3 fL (ref 80.0–100.0)
MCV: 90.8 fL (ref 80.0–100.0)
MCV: 89.4 fL (ref 80.0–100.0)
Platelets: 86 10*3/uL — ABNORMAL LOW (ref 150–400)
Platelets: 90 10*3/uL — ABNORMAL LOW (ref 150–400)
Platelets: 84 10*3/uL — ABNORMAL LOW (ref 150–400)
RBC: 2.64 MIL/uL — ABNORMAL LOW (ref 4.22–5.81)
RBC: 2.71 MIL/uL — ABNORMAL LOW (ref 4.22–5.81)
RBC: 2.54 MIL/uL — ABNORMAL LOW (ref 4.22–5.81)
RDW: 18.6 % — ABNORMAL HIGH (ref 11.5–15.5)
RDW: 18 % — ABNORMAL HIGH (ref 11.5–15.5)
RDW: 17.7 % — ABNORMAL HIGH (ref 11.5–15.5)
WBC: 3 10*3/uL — ABNORMAL LOW (ref 4.0–10.5)
WBC: 3.2 10*3/uL — ABNORMAL LOW (ref 4.0–10.5)
WBC: 3.5 10*3/uL — ABNORMAL LOW (ref 4.0–10.5)
nRBC: 0 % (ref 0.0–0.2)
nRBC: 0 % (ref 0.0–0.2)
nRBC: 0 % (ref 0.0–0.2)

## 2022-07-23 LAB — TYPE AND SCREEN
ABO/RH(D): B POS
Antibody Screen: NEGATIVE
Unit division: 0
Unit division: 0

## 2022-07-23 LAB — RENAL FUNCTION PANEL
Albumin: 2.7 g/dL — ABNORMAL LOW (ref 3.5–5.0)
Anion gap: 17 — ABNORMAL HIGH (ref 5–15)
BUN: 107 mg/dL — ABNORMAL HIGH (ref 8–23)
CO2: 22 mmol/L (ref 22–32)
Calcium: 7.7 mg/dL — ABNORMAL LOW (ref 8.9–10.3)
Chloride: 99 mmol/L (ref 98–111)
Creatinine, Ser: 14.46 mg/dL — ABNORMAL HIGH (ref 0.61–1.24)
GFR, Estimated: 3 mL/min — ABNORMAL LOW (ref >60)
Glucose, Bld: 86 mg/dL (ref 70–99)
Phosphorus: 7.9 mg/dL — ABNORMAL HIGH (ref 2.5–4.6)
Potassium: 5 mmol/L (ref 3.5–5.1)
Sodium: 138 mmol/L (ref 135–145)

## 2022-07-23 LAB — BPAM RBC
Blood Product Expiration Date: 202401142359
Blood Product Expiration Date: 202401252359
ISSUE DATE / TIME: 202401112041
ISSUE DATE / TIME: 202401120952
Unit Type and Rh: 7300
Unit Type and Rh: 9500

## 2022-07-23 LAB — GLUCOSE, CAPILLARY: Glucose-Capillary: 82 mg/dL (ref 70–99)

## 2022-07-23 MED ORDER — PANTOPRAZOLE SODIUM 40 MG PO TBEC
40.0000 mg | DELAYED_RELEASE_TABLET | Freq: Every day | ORAL | Status: DC
  Administered 2022-07-23 – 2022-07-24 (×2): 40 mg via ORAL
  Filled 2022-07-23 (×2): qty 1

## 2022-07-23 NOTE — Progress Notes (Signed)
[facility] PROGRESS NOTE    Progress Note  Raymond [name]  IEP:[id number] DOB: [birth year] DOA: 07/21/2022 PCP: [name], FNP     Brief Narrative:   Raymond [name] is an 67 y.o. 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 difficile infection comes into the ED due to altered mental status at dialysis center, in the ED hemoglobin was 7 (from 11.72 weeks prior) FOBT positive, persistent diarrhea 5 episodes of dark watery stools was transfused 1 unit of packed red blood cells in the ED he also had acute metabolic encephalopathy, CT of the head showed no acute findings, white count of 2.6, platelet count of 103    Assessment/Plan:   Acute metabolic encephalopathy: CT of the head negative SARS-CoV-2 PCR and influenza are negative. Question due to drop in hemoglobin. Status post transfusion his mentation is significantly improved, he relates he feels better.  Acute blood loss anemia/anemia of chronic renal disease/history of AVMs: Baseline hemoglobin around 11, on admission 7.0, FOBT positive. Aspirin was held he was transfused 1 unit packed red blood cells. GI was consulted recommended conservative management transfuse if symptomatic or hemoglobin less than 7. Check a CBC tomorrow morning.  Recent history of C. difficile infection: Continues to have watery stools continue Florasto and vancomycin will finish his treatment in house.  Pancytopenia: All 3 cell lines are down. Will need to follow-up with hematology as an outpatient.  Prolonged QTc: Potassium is 5.0 magnesium is 1.8. Continue monitor electrolytes and optimize.  End-stage renal disease on hemodialysis Tuesday Thursday and  Saturdays: Nephrology has been notified to resume HD.  Continue current home regimen.  Hyperlipidemia: Continue statins.  Chronic anxiety/depression: Continue Zoloft.  GERD: Continue IV PPI twice a day.  Essential hypertension: Due to suspected GI bleed all antihypertensive medications were held, blood pressures relatively stable but he is mildly tachycardic.   DVT prophylaxis: scd Family Communication:none Status is: Inpatient Remains inpatient appropriate because: Altered mental status likely due to GI bleed    Code Status:     Code Status Orders  (From admission, onward)           Start     Ordered   07/22/22 0051  Full code  Continuous       Question:  By:  Answer:  Consent: discussion documented in EHR   07/22/22 0050           Code Status History     Date Active Date Inactive Code Status Order ID Comments User Context   07/03/2022 2142 07/08/2022 2229 Full Code [id number]  Orene Desanctis, DO ED   12/20/2021 1600 12/22/2021 2235 Full Code [id number]  Karmen Bongo, MD ED   11/11/2021 1243 12/01/2021 2050 Full Code [id number]  Lequita Halt, MD ED   09/13/2021 2213 09/17/2021 1941 Full Code [id number]  Eugenie Filler, MD Inpatient   05/18/2021 1631 05/26/2021 0403 Full Code [id number]  Gaylan Gerold, DO ED   04/03/2021 0134 04/15/2021 2221 Full Code [id number]  Mick Sell, PA-C ED   01/25/2021 1909 01/29/2021 1558 Full Code [id number]  Elodia Florence., MD ED   01/19/2021 2213 01/20/2021 2147 Full Code [id number]  Kayleen Memos, DO ED   12/09/2020 1449 12/11/2020 1831 Full Code [id number]  Lequita Halt, MD  ED   07/28/2020 2015 08/04/2020 0007 Full Code [id number]  Rhetta Mura, DO ED   06/15/2020 1845 06/22/2020 2139 Full Code [id number]  Orbie Hurst Inpatient   06/15/2020 1845 06/15/2020 1845 Full Code [id number]  Waynetta Sandy, MD Inpatient   01/14/2020 0805 01/19/2020 1036 Full Code [id number]  Norval Morton, MD ED   12/24/2018 2104 12/27/2018 2134  Full Code [id number]  Lenore Cordia, MD ED   09/12/2018 0209 09/14/2018 2032 Full Code [id number]  Etta Quill, DO ED   08/18/2018 1827 08/22/2018 1421 Full Code [id number]  Florencia Reasons, MD ED   08/10/2018 0309 08/13/2018 1935 Full Code [id number]  Shela Leff, MD ED   08/02/2018 0127 08/04/2018 1945 Full Code [id number]  Etta Quill, DO ED   07/13/2018 0802 07/15/2018 1751 Full Code [id number]  Phillips Grout, MD ED   04/11/2018 1926 04/21/2018 1918 Full Code [id number]  Gabriel Earing, PA-C Inpatient   03/31/2018 2014 04/03/2018 1457 Full Code [id number]  Patrecia Pour, MD Inpatient   03/29/2018 1632 03/29/2018 2312 Full Code [id number]  End, Harrell Gave, MD Inpatient   02/08/2018 1348 02/08/2018 2031 Full Code [id number]  Conrad Atlantic, MD Inpatient   08/07/2017 1804 08/08/2017 1659 Full Code [id number]  Ledell Noss, MD ED   06/20/2017 1947 06/22/2017 1803 Full Code [id number]  Burgess Estelle, MD Inpatient   12/05/2016 1257 12/06/2016 1717 Full Code [id number]  Shela Leff, MD Inpatient   12/19/2015 0339 12/22/2015 2042 Full Code [id number]  Norval Morton, MD Inpatient         IV Access:   Peripheral IV   Procedures and diagnostic studies:   CT Head Wo Contrast  Result Date: 07/21/2022 CLINICAL DATA:  Mental status change. EXAM: CT HEAD WITHOUT CONTRAST TECHNIQUE: Contiguous axial images were obtained from the base of the skull through the vertex without intravenous contrast. RADIATION DOSE REDUCTION: This exam was performed according to the departmental dose-optimization program which includes automated exposure control, adjustment of the mA and/or kV according to patient size and/or use of iterative reconstruction technique. COMPARISON:  Head CT 01/10/2022.  MRI brain 07/08/2022. FINDINGS: Brain: No evidence of acute infarction, hemorrhage, hydrocephalus, extra-axial collection or mass lesion/mass effect. Examination is mildly limited secondary to motion artifact. Vascular: Atherosclerotic  calcifications are present within the cavernous internal carotid arteries. Skull: Normal. Negative for fracture or focal lesion. Sinuses/Orbits: No acute finding. Other: None. IMPRESSION: No acute intracranial process. Electronically Signed   By: Ronney Asters M.D.   On: 07/21/2022 17:03   DG Chest Portable 1 View  Result Date: 07/21/2022 CLINICAL DATA:  Shortness of breath EXAM: PORTABLE CHEST 1 VIEW COMPARISON:  07/03/2022 FINDINGS: Cardiomegaly. No acute airspace disease or effusion. Aortic atherosclerosis. No pneumothorax. IMPRESSION: No active disease. Cardiomegaly. Electronically Signed   By: Donavan Foil M.D.   On: 07/21/2022 16:38     Medical Consultants:   None.   Subjective:    Arjay E Stucky no bloody bowel movement feels better today.  Objective:    Vitals:   07/22/22 2025 07/23/22 0035 07/23/22 0300 07/23/22 0755  BP:  (!) 149/104 (!) 142/107 (!) 145/98  Pulse:   (!) 117 (!) 118  Resp:  '17 17 17  '$ Temp: (!) 97.5 F (36.4 C) 97.8 F (36.6 C) 97.8 F (36.6 C) 98.2 F (36.8 C)  TempSrc: Oral Oral Oral Oral  SpO2:   100% 97%  Weight:      Height:  SpO2: 97 % O2 Flow Rate (L/min): 2 L/min   Intake/Output Summary (Last 24 hours) at 07/23/2022 1017 Last data filed at 07/22/2022 2000 Gross per 24 hour  Intake 240 ml  Output 0 ml  Net 240 ml    Filed Weights   07/21/22 1736  Weight: 64 kg    Exam: General exam: In no acute distress. Respiratory system: Good air movement and clear to auscultation. Cardiovascular system: S1 & S2 heard, RRR. No JVD. Gastrointestinal system: Abdomen is nondistended, soft and nontender.  Extremities: No pedal edema. Skin: No rashes, lesions or ulcers Psychiatry: Judgement and insight appear normal. Mood & affect appropriate.   Data Reviewed:    Labs: Basic Metabolic Panel: Recent Labs  Lab 07/21/22 1756 07/21/22 1840 07/22/22 0239 07/22/22 0830 07/23/22 0540  NA 139 141 139 139 138  K 5.3* 4.1 5.0 5.1 5.0   CL 98 97* 99 98 99  CO2  --  '28 26 26 22  '$ GLUCOSE 66* 101* 98 81 86  BUN 102* 88* 90* 94* 107*  CREATININE 12.60* 12.52* 12.84* 13.50* 14.46*  CALCIUM  --  8.8* 8.2* 8.1* 7.7*  MG  --   --  1.8  --   --   PHOS  --   --  6.7* 7.0* 7.9*    GFR Estimated Creatinine Clearance: 4.4 mL/min (A) (by C-G formula based on SCr of 14.46 mg/dL (H)). Liver Function Tests: Recent Labs  Lab 07/21/22 1840 07/22/22 0239 07/22/22 0830 07/23/22 0540  AST 10*  --   --   --   ALT 14  --   --   --   ALKPHOS 87  --   --   --   BILITOT 0.8  --   --   --   PROT 6.5  --   --   --   ALBUMIN 2.9* 2.7* 2.7* 2.7*    No results for input(s): "LIPASE", "AMYLASE" in the last 168 hours. Recent Labs  Lab 07/21/22 1715  AMMONIA 15    Coagulation profile No results for input(s): "INR", "PROTIME" in the last 168 hours. COVID-19 Labs  No results for input(s): "DDIMER", "FERRITIN", "LDH", "CRP" in the last 72 hours.  Lab Results  Component Value Date   SARSCOV2NAA NEGATIVE 07/21/2022   SARSCOV2NAA POSITIVE (A) 07/03/2022   SARSCOV2NAA NEGATIVE 09/13/2021   [place] NEGATIVE 05/25/2021    CBC: Recent Labs  Lab 07/21/22 1715 07/21/22 1756 07/22/22 0239 07/22/22 2123 07/23/22 0540  WBC 2.6*  --  2.8* 3.0* 3.0*  NEUTROABS 1.6*  --   --   --   --   HGB 7.0* 7.8* 7.4* 8.8* 7.9*  HCT 20.7* 23.0* 22.6* 26.6* 23.3*  MCV 91.6  --  92.6 89.3 88.3  PLT 103*  --  89* 87* 86*    Cardiac Enzymes: No results for input(s): "CKTOTAL", "CKMB", "CKMBINDEX", "TROPONINI" in the last 168 hours. BNP (last 3 results) No results for input(s): "PROBNP" in the last 8760 hours. CBG: Recent Labs  Lab 07/21/22 1701 07/21/22 1825 07/22/22 1816 07/23/22 0600  GLUCAP 56* 96 81 82    D-Dimer: No results for input(s): "DDIMER" in the last 72 hours. Hgb A1c: No results for input(s): "HGBA1C" in the last 72 hours. Lipid Profile: No results for input(s): "CHOL", "HDL", "LDLCALC", "TRIG", "CHOLHDL",  "LDLDIRECT" in the last 72 hours. Thyroid function studies: No results for input(s): "TSH", "T4TOTAL", "T3FREE", "THYROIDAB" in the last 72 hours.  Invalid input(s): "FREET3" Anemia work up: No  results for input(s): "VITAMINB12", "FOLATE", "FERRITIN", "TIBC", "IRON", "RETICCTPCT" in the last 72 hours. Sepsis Labs: Recent Labs  Lab 07/21/22 1715 07/21/22 1718 07/22/22 0239 07/22/22 2123 07/23/22 0540  WBC 2.6*  --  2.8* 3.0* 3.0*  LATICACIDVEN  --  1.2  --   --   --     Microbiology Recent Results (from the past 240 hour(s))  Resp panel by RT-PCR (RSV, Flu A&B, Covid) Anterior Nasal Swab     Status: None   Collection Time: 07/21/22  5:50 PM   Specimen: Anterior Nasal Swab  Result Value Ref Range Status   SARS Coronavirus 2 by RT PCR NEGATIVE NEGATIVE Final    Comment: (NOTE) SARS-CoV-2 target nucleic acids are NOT DETECTED.  The SARS-CoV-2 RNA is generally detectable in upper respiratory specimens during the acute phase of infection. The lowest concentration of SARS-CoV-2 viral copies this assay can detect is 138 copies/mL. A negative result does not preclude SARS-Cov-2 infection and should not be used as the sole basis for treatment or other patient management decisions. A negative result may occur with  improper specimen collection/handling, submission of specimen other than nasopharyngeal swab, presence of viral mutation(s) within the areas targeted by this assay, and inadequate number of viral copies(<138 copies/mL). A negative result must be combined with clinical observations, patient history, and epidemiological information. The expected result is Negative.  Fact Sheet for Patients:  EntrepreneurPulse.com.au  Fact Sheet for Healthcare Providers:  IncredibleEmployment.be  This test is no t yet approved or cleared by the [place] FDA and  has been authorized for detection and/or diagnosis of SARS-CoV-2 by FDA under an  Emergency Use Authorization (EUA). This EUA will remain  in effect (meaning this test can be used) for the duration of the COVID-19 declaration under Section 564(b)(1) of the Act, 21 U.S.C.section 360bbb-3(b)(1), unless the authorization is terminated  or revoked sooner.       Influenza A by PCR NEGATIVE NEGATIVE Final   Influenza B by PCR NEGATIVE NEGATIVE Final    Comment: (NOTE) The Xpert Xpress SARS-CoV-2/FLU/RSV plus assay is intended as an aid in the diagnosis of influenza from Nasopharyngeal swab specimens and should not be used as a sole basis for treatment. Nasal washings and aspirates are unacceptable for Xpert Xpress SARS-CoV-2/FLU/RSV testing.  Fact Sheet for Patients: EntrepreneurPulse.com.au  Fact Sheet for Healthcare Providers: IncredibleEmployment.be  This test is not yet approved or cleared by the [place] FDA and has been authorized for detection and/or diagnosis of SARS-CoV-2 by FDA under an Emergency Use Authorization (EUA). This EUA will remain in effect (meaning this test can be used) for the duration of the COVID-19 declaration under Section 564(b)(1) of the Act, 21 U.S.C. section 360bbb-3(b)(1), unless the authorization is terminated or revoked.     Resp Syncytial Virus by PCR NEGATIVE NEGATIVE Final    Comment: (NOTE) Fact Sheet for Patients: EntrepreneurPulse.com.au  Fact Sheet for Healthcare Providers: IncredibleEmployment.be  This test is not yet approved or cleared by the [place] FDA and has been authorized for detection and/or diagnosis of SARS-CoV-2 by FDA under an Emergency Use Authorization (EUA). This EUA will remain in effect (meaning this test can be used) for the duration of the COVID-19 declaration under Section 564(b)(1) of the Act, 21 U.S.C. section 360bbb-3(b)(1), unless the authorization is terminated or revoked.  Performed at [facility], [place] [address], [place], [place] [address]      Medications:    sodium chloride   Intravenous Once  carvedilol  12.5 mg Oral BID WC   pantoprazole (PROTONIX) IV  40 mg Intravenous BID   saccharomyces boulardii  250 mg Oral BID   vancomycin  125 mg Oral QID   Followed by   Derrill Memo ON 08/05/2022] vancomycin  125 mg Oral BID   Followed by   Derrill Memo ON 08/12/2022] vancomycin  125 mg Oral Daily   Followed by   Derrill Memo ON 08/19/2022] vancomycin  125 mg Oral QODAY   Followed by   Derrill Memo ON 08/27/2022] vancomycin  125 mg Oral Q3 days   Continuous Infusions:    LOS: 2 days   Charlynne Cousins  [facility]  07/23/2022, 10:17 AM

## 2022-07-23 NOTE — Progress Notes (Signed)
Subjective: Hungry.  Objective: Vital signs in last 24 hours: Temp:  [97.5 F (36.4 C)-98.6 F (37 C)] 98.2 F (36.8 C) (01/13 0755) Pulse Rate:  [58-118] 118 (01/13 0755) Resp:  [10-32] 17 (01/13 0755) BP: (120-161)/(83-138) 145/98 (01/13 0755) SpO2:  [78 %-100 %] 97 % (01/13 0755)    Intake/Output from previous day: 01/12 0701 - 01/13 0700 In: 240 [P.O.:240] Out: 0  Intake/Output this shift: No intake/output data recorded.  General appearance: alert and no distress GI: soft, non-tender; bowel sounds normal; no masses,  no organomegaly  Lab Results: Recent Labs    07/22/22 0239 07/22/22 2123 07/23/22 0540  WBC 2.8* 3.0* 3.0*  HGB 7.4* 8.8* 7.9*  HCT 22.6* 26.6* 23.3*  PLT 89* 87* 86*   BMET Recent Labs    07/22/22 0239 07/22/22 0830 07/23/22 0540  NA 139 139 138  K 5.0 5.1 5.0  CL 99 98 99  CO2 '26 26 22  '$ GLUCOSE 98 81 86  BUN 90* 94* 107*  CREATININE 12.84* 13.50* 14.46*  CALCIUM 8.2* 8.1* 7.7*   LFT Recent Labs    07/21/22 1840 07/22/22 0239 07/23/22 0540  PROT 6.5  --   --   ALBUMIN 2.9*   < > 2.7*  AST 10*  --   --   ALT 14  --   --   ALKPHOS 87  --   --   BILITOT 0.8  --   --    < > = values in this interval not displayed.   PT/INR No results for input(s): "LABPROT", "INR" in the last 72 hours. Hepatitis Panel No results for input(s): "HEPBSAG", "HCVAB", "HEPAIGM", "HEPBIGM" in the last 72 hours. C-Diff No results for input(s): "CDIFFTOX" in the last 72 hours. Fecal Lactopherrin No results for input(s): "FECLLACTOFRN" in the last 72 hours.  Studies/Results: CT Head Wo Contrast  Result Date: 07/21/2022 CLINICAL DATA:  Mental status change. EXAM: CT HEAD WITHOUT CONTRAST TECHNIQUE: Contiguous axial images were obtained from the base of the skull through the vertex without intravenous contrast. RADIATION DOSE REDUCTION: This exam was performed according to the departmental dose-optimization program which includes automated exposure  control, adjustment of the mA and/or kV according to patient size and/or use of iterative reconstruction technique. COMPARISON:  Head CT 01/10/2022.  MRI brain 07/08/2022. FINDINGS: Brain: No evidence of acute infarction, hemorrhage, hydrocephalus, extra-axial collection or mass lesion/mass effect. Examination is mildly limited secondary to motion artifact. Vascular: Atherosclerotic calcifications are present within the cavernous internal carotid arteries. Skull: Normal. Negative for fracture or focal lesion. Sinuses/Orbits: No acute finding. Other: None. IMPRESSION: No acute intracranial process. Electronically Signed   By: Ronney Asters M.D.   On: 07/21/2022 17:03   DG Chest Portable 1 View  Result Date: 07/21/2022 CLINICAL DATA:  Shortness of breath EXAM: PORTABLE CHEST 1 VIEW COMPARISON:  07/03/2022 FINDINGS: Cardiomegaly. No acute airspace disease or effusion. Aortic atherosclerosis. No pneumothorax. IMPRESSION: No active disease. Cardiomegaly. Electronically Signed   By: Donavan Foil M.D.   On: 07/21/2022 16:38    Medications: Scheduled:  sodium chloride   Intravenous Once   carvedilol  12.5 mg Oral BID WC   pantoprazole (PROTONIX) IV  40 mg Intravenous BID   saccharomyces boulardii  250 mg Oral BID   vancomycin  125 mg Oral QID   Followed by   Derrill Memo ON 08/05/2022] vancomycin  125 mg Oral BID   Followed by   Derrill Memo ON 08/12/2022] vancomycin  125 mg Oral Daily   Followed  by   Derrill Memo ON 08/19/2022] vancomycin  125 mg Oral QODAY   Followed by   Derrill Memo ON 08/27/2022] vancomycin  125 mg Oral Q3 days   Continuous:  Assessment/Plan: 1) Anemia. 2) Nonbleeding jejunal AVM s/p APC. 3) ESRD.   The patient is stable.  He feels well.  The HGB did decline from 8.8 to 7.9 g/dL.  No reports of any overt bleeding.  Plan: 1) Follow HGB and transfuse if necessary. 2) Okay with a regular diet.  LOS: 2 days   Haden Cavenaugh D 07/23/2022, 9:01 AM

## 2022-07-24 ENCOUNTER — Encounter (HOSPITAL_COMMUNITY): Payer: Self-pay | Admitting: Gastroenterology

## 2022-07-24 DIAGNOSIS — K922 Gastrointestinal hemorrhage, unspecified: Secondary | ICD-10-CM | POA: Diagnosis not present

## 2022-07-24 DIAGNOSIS — D649 Anemia, unspecified: Secondary | ICD-10-CM | POA: Diagnosis not present

## 2022-07-24 DIAGNOSIS — R4182 Altered mental status, unspecified: Secondary | ICD-10-CM | POA: Diagnosis not present

## 2022-07-24 LAB — RENAL FUNCTION PANEL
Albumin: 2.8 g/dL — ABNORMAL LOW (ref 3.5–5.0)
Anion gap: 15 (ref 5–15)
BUN: 116 mg/dL — ABNORMAL HIGH (ref 8–23)
CO2: 25 mmol/L (ref 22–32)
Calcium: 8.4 mg/dL — ABNORMAL LOW (ref 8.9–10.3)
Chloride: 99 mmol/L (ref 98–111)
Creatinine, Ser: 15.68 mg/dL — ABNORMAL HIGH (ref 0.61–1.24)
GFR, Estimated: 3 mL/min — ABNORMAL LOW (ref >60)
Glucose, Bld: 90 mg/dL (ref 70–99)
Phosphorus: 8.6 mg/dL — ABNORMAL HIGH (ref 2.5–4.6)
Potassium: 5.8 mmol/L — ABNORMAL HIGH (ref 3.5–5.1)
Sodium: 139 mmol/L (ref 135–145)

## 2022-07-24 LAB — CBC
HCT: 23.9 % — ABNORMAL LOW (ref 39.0–52.0)
Hemoglobin: 7.7 g/dL — ABNORMAL LOW (ref 13.0–17.0)
MCH: 29.6 pg (ref 26.0–34.0)
MCHC: 32.2 g/dL (ref 30.0–36.0)
MCV: 91.9 fL (ref 80.0–100.0)
Platelets: 83 10*3/uL — ABNORMAL LOW (ref 150–400)
RBC: 2.6 MIL/uL — ABNORMAL LOW (ref 4.22–5.81)
RDW: 18 % — ABNORMAL HIGH (ref 11.5–15.5)
WBC: 3.2 10*3/uL — ABNORMAL LOW (ref 4.0–10.5)
nRBC: 0 % (ref 0.0–0.2)

## 2022-07-24 MED ORDER — ASPIRIN 81 MG PO CHEW: 81.0000 mg | CHEWABLE_TABLET | Freq: Every day | ORAL | 2 refills | Status: AC

## 2022-07-24 NOTE — Discharge Summary (Signed)
Physician Discharge Summary  DONALDSON RICHTER YIF:[id number] DOB: Feb 25, 1956 DOA: 07/21/2022  PCP: [name], FNP  Admit date: 07/21/2022 Discharge date: 07/24/2022  Admitted From: Home Disposition:  Home  Recommendations for Outpatient Follow-up:  Follow up with PCP in 1-2 weeks, check a CBC if he hemoglobin platelets and white blood cell count are down he might need a hematology evaluation Please obtain BMP/CBC in one week   Home Health:No Equipment/Devices:None  Discharge Condition:Stable CODE STATUS:Full Diet recommendation: Heart Healthy  Brief/Interim Summary:  67 y.o. 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 difficile infection comes into the ED due to altered mental status at dialysis center, in the ED hemoglobin was 7 (from 11.72 weeks prior) FOBT positive, persistent diarrhea 5 episodes of dark watery stools was transfused 1 unit of packed red blood cells in the ED he also had acute metabolic encephalopathy, CT of the head showed no acute findings, white count of 2.6, platelet count of 103   Discharge Diagnoses:  Principal Problem:   AMS (altered mental status) Active Problems:   ESRD (end stage renal disease) on dialysis ([place])   Type 2 diabetes mellitus with other diabetic kidney complication ([place])   Acute metabolic encephalopathy   Paroxysmal atrial fibrillation (HCC)   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 Question of drop in hemoglobin was contributing to his altered mental status he status post 1 unit of packed red blood cells this mentation is significantly improved.  Acute blood loss anemia/anemia of chronic renal disease/history of AVM: Hemoglobin baseline around 11, on admission 7 FOBT positive aspirin was held he was transfused 1 unit of packed red blood cells his hemoglobin came up. GI was consulted and recommended conservative management. His hemoglobin was monitored and remained stable.  History of recent C. difficile infection: He was started on Florastor his diarrhea resolved he completed his vancomycin treatment as an outpatient he had no further diarrhea in house. Watery stool possibly due to acute blood loss anemia.  Pancytopenia: All 3 cell lines are down he will need to follow-up with hematology oncology as an outpatient.  Prolonged QTc: Electrolytes were corrected QTc could normalize.  End-stage renal disease on hemodialysis: Nephrology was consulted he was continued on his regular dialysis day.  Hyperlipidemia: Continue statins.  Chronic anxiety/depression: Continue Zoloft.  GERD: Continue PPI.  Essential hypertension: Antihypertensive medications were held due to GI bleed.  He will be resumed as an outpatient.   Discharge Instructions   Allergies as of 07/24/2022   No Known Allergies      Medication List     TAKE these medications    albuterol 108 (90 Base) MCG/ACT inhaler Commonly known as: VENTOLIN HFA Inhale 3 puffs into the lungs daily as needed for shortness of breath or wheezing.   aspirin 81 MG chewable tablet Chew 1 tablet (81 mg total) by mouth daily. Start taking on: July 26, 2022 What changed: These instructions start on July 26, 2022. If you are unsure what to do until then, ask your doctor or other care provider.   atorvastatin 40 MG  tablet Commonly known as: LIPITOR Take 1 tablet (40 mg total) by mouth daily.   Breo Ellipta 200-25 MCG/ACT Aepb Generic drug: fluticasone furoate-vilanterol Inhale 2 puffs into the lungs daily as needed (for shortness of breath).   calcitRIOL 0.5 MCG capsule Commonly known as: ROCALTROL Take 1 capsule (0.5 mcg total) by mouth Every Tuesday,Thursday,and Saturday with dialysis.   carvedilol 12.5 MG tablet Commonly known as: COREG Take 2 tablets (25 mg total) by mouth every morning AND 1 tablet (12.5 mg total) every evening.   cinacalcet 30 MG tablet Commonly known as: SENSIPAR Take 6 tablets (180 mg total) by mouth Every Tuesday,Thursday,and Saturday with dialysis.   Darbepoetin Alfa 200 MCG/0.4ML Sosy injection Commonly known as: ARANESP Inject 0.4 mLs (200 mcg total) into the vein every Tuesday with hemodialysis.   hydrALAZINE 10 MG tablet Commonly known as: APRESOLINE Take 1 tablet (10 mg total) by mouth every 8 (eight) hours.   isosorbide mononitrate 30 MG 24 hr tablet Commonly known as: IMDUR Take 1/2 tablet (15 mg total) by mouth daily.   loperamide 2 MG capsule Commonly known as: IMODIUM Take 2 mg by mouth as needed for diarrhea or loose stools.   metroNIDAZOLE 500 MG tablet Commonly known as: FLAGYL Take 500 mg by mouth 3 (three) times daily.   multivitamin Tabs tablet Take 1 tablet by mouth at bedtime.   pantoprazole 40 MG tablet Commonly known as: PROTONIX Take 1 tablet (40 mg total) by mouth 2 (two) times daily before a meal. What changed: when to take this   sertraline 25 MG tablet Commonly known as: ZOLOFT Take 25 mg by mouth daily.   sevelamer carbonate 800 MG tablet Commonly known as: RENVELA Take 5 tablets (4,000 mg total) by mouth 3 (three) times daily with meals.        No Known Allergies  Consultations: Gastroenterology   Procedures/Studies: CT Head Wo Contrast  Result Date: 07/21/2022 CLINICAL DATA:  Mental status change. EXAM:  CT HEAD WITHOUT CONTRAST TECHNIQUE: Contiguous axial images were obtained from the base of the skull through the vertex without intravenous contrast. RADIATION DOSE REDUCTION: This exam was performed according to the departmental dose-optimization program which includes automated exposure control, adjustment of the mA and/or kV according to patient size and/or use of iterative reconstruction technique. COMPARISON:  Head CT 01/10/2022.  MRI brain 07/08/2022. FINDINGS: Brain: No evidence of acute infarction, hemorrhage, hydrocephalus, extra-axial collection or mass lesion/mass effect. Examination is mildly limited secondary to motion artifact. Vascular: Atherosclerotic calcifications are present within the cavernous internal carotid arteries. Skull: Normal. Negative for fracture or focal lesion. Sinuses/Orbits: No acute finding. Other: None. IMPRESSION: No acute intracranial process. Electronically Signed   By: Ronney Asters M.D.   On: 07/21/2022 17:03   DG Chest Portable 1 View  Result Date: 07/21/2022 CLINICAL DATA:  Shortness of breath EXAM: PORTABLE CHEST 1 VIEW COMPARISON:  07/03/2022 FINDINGS: Cardiomegaly. No acute airspace disease or effusion. Aortic atherosclerosis. No pneumothorax. IMPRESSION: No active disease. Cardiomegaly. Electronically Signed   By: Donavan Foil M.D.   On: 07/21/2022 16:38   MR BRAIN WO CONTRAST  Result Date: 07/08/2022 CLINICAL DATA:  Concern for possible right insular stroke. EXAM: MRI HEAD WITHOUT CONTRAST TECHNIQUE: Multiplanar, multiecho pulse sequences of the brain and surrounding structures were obtained without intravenous contrast. COMPARISON:  Head CT 01/10/2022 and MRI 01/19/2021 FINDINGS: The patient  terminated the examination prior to completion due to difficulty breathing while lying supine. Axial and coronal diffusion, axial T2, axial T2 FLAIR, sagittal T1, and axial T2* GRE sequences were obtained with the latter being severely motion degraded. Brain: There is  no evidence of an acute infarct, mass, midline shift, or extra-axial fluid collection. Assessment for hemorrhage is limited due to motion artifact on the GRE sequence. T2 hyperintensities in the cerebral white matter bilaterally are unchanged from the prior MRI and nonspecific but compatible with moderate chronic small vessel ischemic disease. There is an unchanged dilated perivascular space or chronic lacunar infarct inferiorly in the right basal ganglia. There is mild cerebral atrophy. Vascular: Major intracranial vascular flow voids are preserved. Skull and upper cervical spine: Unremarkable bone marrow signal. Sinuses/Orbits: Right cataract extraction. Remote left orbital fractures. Chronic, benign signal abnormality in the region of the left sphenoid sinus potentially reflecting arrested pneumatization. No significant mastoid fluid. Other: None. IMPRESSION: 1. Motion degraded, incomplete examination. 2. No acute infarct. 3. Moderate chronic small vessel ischemic disease. Electronically Signed   By: Logan Bores M.D.   On: 07/08/2022 14:18   ECHOCARDIOGRAM COMPLETE  Result Date: 07/07/2022    ECHOCARDIOGRAM REPORT   Patient Name:   SANDRO BURGO Kister Date of Exam: 07/07/2022 Medical Rec #:  [id number]     Height:       68.0 in Accession #:    [id number]    Weight:       138.1 lb Date of Birth:  [birth year]     BSA:          1.746 m Patient Age:    67 years      BP:           133/80 mmHg Patient Gender: M             HR:           80 bpm. Exam Location:  Inpatient Procedure: 2D Echo, Cardiac Doppler and Color Doppler Indications:    Abnormal ECG R94.31  History:        Patient has prior history of Echocardiogram examinations, most                 recent 04/01/2024. CAD, COPD, Arrythmias:Atrial Fibrillation;                 Risk Factors:Diabetes, Hypertension, Dyslipidemia, Non-Smoker                 and Sleep Apnea.  Sonographer:    Greer Pickerel Referring Phys: [id number] RALPH A NETTEY  Sonographer Comments: Image  acquisition challenging due to respiratory motion. IMPRESSIONS  1. Left ventricular ejection fraction, by estimation, is 45 to 50%. The left ventricle has mildly decreased function. The left ventricle demonstrates global hypokinesis. 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 Left ventricular diastolic parameters are indeterminate.  2. Right ventricular systolic function is normal. The right ventricular size is normal. There is normal pulmonary artery systolic pressure.  3. Left atrial size was severely dilated.  4. Right atrial size was severely dilated.  5. The mitral valve is normal in structure. No evidence of mitral valve regurgitation. No evidence of mitral stenosis. Moderate mitral annular calcification.  6. Tricuspid valve regurgitation is moderate.  7. The aortic valve is calcified. There is moderate calcification of the aortic valve. There is moderate thickening of the aortic valve. Aortic valve regurgitation is not visualized. Aortic valve sclerosis/calcification is present, without any evidence of  aortic stenosis.  8. 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  9. The inferior vena cava is normal in size with greater than 50% respiratory variability, suggesting right atrial pressure of 3 mmHg. FINDINGS  Left Ventricle: Left ventricular ejection fraction, by estimation, is 45 to 50%. The left ventricle has mildly decreased function. The left ventricle demonstrates global hypokinesis. The left ventricular internal cavity size was normal in size. There is  severe asymmetric left ventricular hypertrophy of the inferior segment. Left ventricular diastolic function could not be evaluated due to mitral annular calcification (moderate or greater). Left ventricular diastolic parameters are indeterminate. Right Ventricle: The right ventricular size is normal. No increase in right ventricular wall  thickness. Right ventricular systolic function is normal. There is normal pulmonary artery systolic pressure. The tricuspid regurgitant velocity is 1.75 m/s, and  with an assumed right atrial pressure of 3 mmHg, the estimated right ventricular systolic pressure is 16.0 mmHg. Left Atrium: Left atrial size was severely dilated. Right Atrium: Right atrial size was severely dilated. Pericardium: There is no evidence of pericardial effusion. Mitral Valve: The mitral valve is normal in structure. Moderate mitral annular calcification. No evidence of mitral valve regurgitation. No evidence of mitral valve stenosis. Tricuspid Valve: The tricuspid valve is normal in structure. Tricuspid valve regurgitation is moderate . No evidence of tricuspid stenosis. Aortic Valve: The aortic valve is calcified. There is moderate calcification of the aortic valve. There is moderate thickening of the aortic valve. There is moderate aortic valve annular calcification. Aortic valve regurgitation is not visualized. Aortic  valve sclerosis/calcification is present, without any evidence of aortic stenosis. Pulmonic Valve: The pulmonic valve was normal in structure. Pulmonic valve regurgitation is trivial. No evidence of pulmonic stenosis. Aorta: The aortic root is normal in size and structure. 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 Venous: The inferior vena cava is normal in size with greater than 50% respiratory variability, suggesting right atrial pressure of 3 mmHg. IAS/Shunts: No atrial level shunt detected by color flow Doppler.  LEFT VENTRICLE PLAX 2D LVIDd:         4.00 cm   Diastology LVIDs:         3.60 cm   LV e' medial:    3.48 cm/s LV PW:         1.60 cm   LV E/e' medial:  19.3 LV IVS:        0.90 cm   LV e' lateral:   9.25 cm/s LVOT diam:     2.00 cm   LV E/e' lateral: 7.3 LV SV:         34 LV SV Index:   19 LVOT Area:     3.14 cm  RIGHT VENTRICLE RV S  prime:     10.30 cm/s TAPSE (M-mode): 0.8 cm LEFT ATRIUM              Index        RIGHT ATRIUM           Index LA diam:        4.40 cm  2.52 cm/m   RA Area:     32.70 cm LA Vol (A2C):   89.2 ml  51.09 ml/m  RA Volume:   136.00 ml 77.89 ml/m LA Vol (A4C):   140.0 ml 80.18 ml/m LA Biplane Vol: 110.0 ml  63.00 ml/m  AORTIC VALVE LVOT Vmax:   69.20 cm/s LVOT Vmean:  45.900 cm/s LVOT VTI:    0.108 m  AORTA Ao Root diam: 4.10 cm Ao Asc diam:  4.30 cm MITRAL VALVE               TRICUSPID VALVE MV Area (PHT): 3.97 cm    TR Peak grad:   12.2 mmHg MV Decel Time: 191 msec    TR Vmax:        175.00 cm/s MV E velocity: 67.30 cm/s MV A velocity: 37.70 cm/s  SHUNTS MV E/A ratio:  1.79        Systemic VTI:  0.11 m                            Systemic Diam: 2.00 cm Godfrey Pick Tobb DO Electronically signed by Berniece Salines DO Signature Date/Time: 07/07/2022/5:27:57 PM    Final    DG Chest 2 View  Result Date: 07/03/2022 CLINICAL DATA:  Shortness of breath, fatigue. EXAM: CHEST - 2 VIEW COMPARISON:  12/28/2021. FINDINGS: Heart is enlarged and the mediastinal contour is stable. Atherosclerotic calcification of the aorta is noted. Minimal subsegmental atelectasis is noted at the left lung base. No effusion or pneumothorax. A vascular stent is noted in the left upper extremity. No acute osseous abnormality. IMPRESSION: 1. No active cardiopulmonary disease. 2. Cardiomegaly. Electronically Signed   By: Brett Fairy M.D.   On: 07/03/2022 20:11   (Echo, Carotid, EGD, Colonoscopy, ERCP)    Subjective: No complaints no further GI bleed  Discharge Exam: Vitals:   07/24/22 0347 07/24/22 0812  BP: 106/88 (!) 154/103  Pulse: (!) 110   Resp: 20   Temp: (!) 97.4 F (36.3 C) 98.4 F (36.9 C)  SpO2: 96%    Vitals:   07/23/22 2335 07/24/22 0000 07/24/22 0347 07/24/22 0812  BP: (!) 147/101  106/88 (!) 154/103  Pulse: (!) 112  (!) 110   Resp: 20 (!) 25 20   Temp: 98 F (36.7 C)  (!) 97.4 F (36.3 C) 98.4 F (36.9 C)   TempSrc: Oral  Oral Oral  SpO2: 96%  96%   Weight:      Height:        General: Pt is alert, awake, not in acute distress Cardiovascular: RRR, S1/S2 +, no rubs, no gallops Respiratory: CTA bilaterally, no wheezing, no rhonchi Abdominal: Soft, NT, ND, bowel sounds + Extremities: no edema, no cyanosis    The results of significant diagnostics from this hospitalization (including imaging, microbiology, ancillary and laboratory) are listed below for reference.     Microbiology: Recent Results (from the past 240 hour(s))  Resp panel by RT-PCR (RSV, Flu A&B, Covid) Anterior Nasal Swab     Status: None   Collection Time: 07/21/22  5:50 PM   Specimen: Anterior Nasal Swab  Result Value Ref Range Status   SARS Coronavirus 2 by RT PCR NEGATIVE NEGATIVE Final    Comment: (NOTE) SARS-CoV-2 target nucleic acids are NOT DETECTED.  The SARS-CoV-2 RNA is generally detectable in upper respiratory specimens during the acute phase of infection. The lowest concentration of SARS-CoV-2 viral copies this assay can detect is 138 copies/mL. A negative result does not preclude SARS-Cov-2 infection and should not be used as the sole basis for treatment or other patient management decisions. A negative result may occur with  improper specimen collection/handling, submission of specimen other than nasopharyngeal swab, presence of viral  mutation(s) within the areas targeted by this assay, and inadequate number of viral copies(<138 copies/mL). A negative result must be combined with clinical observations, patient history, and epidemiological information. The expected result is Negative.  Fact Sheet for Patients:  EntrepreneurPulse.com.au  Fact Sheet for Healthcare Providers:  IncredibleEmployment.be  This test is no t yet approved or cleared by the [place] FDA and  has been authorized for detection and/or diagnosis of SARS-CoV-2 by FDA under an Emergency Use  Authorization (EUA). This EUA will remain  in effect (meaning this test can be used) for the duration of the COVID-19 declaration under Section 564(b)(1) of the Act, 21 U.S.C.section 360bbb-3(b)(1), unless the authorization is terminated  or revoked sooner.       Influenza A by PCR NEGATIVE NEGATIVE Final   Influenza B by PCR NEGATIVE NEGATIVE Final    Comment: (NOTE) The Xpert Xpress SARS-CoV-2/FLU/RSV plus assay is intended as an aid in the diagnosis of influenza from Nasopharyngeal swab specimens and should not be used as a sole basis for treatment. Nasal washings and aspirates are unacceptable for Xpert Xpress SARS-CoV-2/FLU/RSV testing.  Fact Sheet for Patients: EntrepreneurPulse.com.au  Fact Sheet for Healthcare Providers: IncredibleEmployment.be  This test is not yet approved or cleared by the [place] FDA and has been authorized for detection and/or diagnosis of SARS-CoV-2 by FDA under an Emergency Use Authorization (EUA). This EUA will remain in effect (meaning this test can be used) for the duration of the COVID-19 declaration under Section 564(b)(1) of the Act, 21 U.S.C. section 360bbb-3(b)(1), unless the authorization is terminated or revoked.     Resp Syncytial Virus by PCR NEGATIVE NEGATIVE Final    Comment: (NOTE) Fact Sheet for Patients: EntrepreneurPulse.com.au  Fact Sheet for Healthcare Providers: IncredibleEmployment.be  This test is not yet approved or cleared by the [place] FDA and has been authorized for detection and/or diagnosis of SARS-CoV-2 by FDA under an Emergency Use Authorization (EUA). This EUA will remain in effect (meaning this test can be used) for the duration of the COVID-19 declaration under Section 564(b)(1) of the Act, 21 U.S.C. section 360bbb-3(b)(1), unless the authorization is terminated or revoked.  Performed at [facility], [place]  [address] [place], [place] [address]      Labs: BNP (last 3 results) Recent Labs    10/14/21 1804 12/20/21 1154  BNP 3,361.2* 7,619.5*   Basic Metabolic Panel: Recent Labs  Lab 07/21/22 1840 07/22/22 0239 07/22/22 0830 07/23/22 0540 07/24/22 0109  NA 141 139 139 138 139  K 4.1 5.0 5.1 5.0 5.8*  CL 97* 99 98 99 99  CO2 '28 26 26 22 25  '$ GLUCOSE 101* 98 81 86 90  BUN 88* 90* 94* 107* 116*  CREATININE 12.52* 12.84* 13.50* 14.46* 15.68*  CALCIUM 8.8* 8.2* 8.1* 7.7* 8.4*  MG  --  1.8  --   --   --   PHOS  --  6.7* 7.0* 7.9* 8.6*   Liver Function Tests: Recent Labs  Lab 07/21/22 1840 07/22/22 0239 07/22/22 0830 07/23/22 0540 07/24/22 0109  AST 10*  --   --   --   --   ALT 14  --   --   --   --   ALKPHOS 87  --   --   --   --   BILITOT 0.8  --   --   --   --   PROT 6.5  --   --   --   --  ALBUMIN 2.9* 2.7* 2.7* 2.7* 2.8*   No results for input(s): "LIPASE", "AMYLASE" in the last 168 hours. Recent Labs  Lab 07/21/22 1715  AMMONIA 15   CBC: Recent Labs  Lab 07/21/22 1715 07/21/22 1756 07/22/22 0239 07/22/22 2123 07/23/22 0540 07/23/22 1428 07/23/22 2134  WBC 2.6*  --  2.8* 3.0* 3.0* 3.2* 3.5*  NEUTROABS 1.6*  --   --   --   --   --   --   HGB 7.0*   < > 7.4* 8.8* 7.9* 7.9* 7.6*  HCT 20.7*   < > 22.6* 26.6* 23.3* 24.6* 22.7*  MCV 91.6  --  92.6 89.3 88.3 90.8 89.4  PLT 103*  --  89* 87* 86* 90* 84*   < > = values in this interval not displayed.   Cardiac Enzymes: No results for input(s): "CKTOTAL", "CKMB", "CKMBINDEX", "TROPONINI" in the last 168 hours. BNP: Invalid input(s): "POCBNP" CBG: Recent Labs  Lab 07/21/22 1701 07/21/22 1825 07/22/22 1816 07/23/22 0600  GLUCAP 56* 96 81 82   D-Dimer No results for input(s): "DDIMER" in the last 72 hours. Hgb A1c No results for input(s): "HGBA1C" in the last 72 hours. Lipid Profile No results for input(s): "CHOL", "HDL", "LDLCALC", "TRIG", "CHOLHDL", "LDLDIRECT" in the last 72 hours. Thyroid function  studies No results for input(s): "TSH", "T4TOTAL", "T3FREE", "THYROIDAB" in the last 72 hours.  Invalid input(s): "FREET3" Anemia work up No results for input(s): "VITAMINB12", "FOLATE", "FERRITIN", "TIBC", "IRON", "RETICCTPCT" in the last 72 hours. Urinalysis    Component Value Date/Time   COLORURINE YELLOW 05/08/2010 1335   APPEARANCEUR CLEAR 05/08/2010 1335   LABSPEC 1.009 05/08/2010 1335   PHURINE 6.0 05/08/2010 1335   GLUCOSEU NEGATIVE 05/08/2010 1335   HGBUR NEGATIVE 05/08/2010 1335   BILIRUBINUR NEGATIVE 05/08/2010 1335   KETONESUR NEGATIVE 05/08/2010 1335   PROTEINUR 100 (A) 05/08/2010 1335   UROBILINOGEN 0.2 05/08/2010 1335   NITRITE NEGATIVE 05/08/2010 1335   LEUKOCYTESUR SMALL (A) 05/08/2010 1335   Sepsis Labs Recent Labs  Lab 07/22/22 2123 07/23/22 0540 07/23/22 1428 07/23/22 2134  WBC 3.0* 3.0* 3.2* 3.5*   Microbiology Recent Results (from the past 240 hour(s))  Resp panel by RT-PCR (RSV, Flu A&B, Covid) Anterior Nasal Swab     Status: None   Collection Time: 07/21/22  5:50 PM   Specimen: Anterior Nasal Swab  Result Value Ref Range Status   SARS Coronavirus 2 by RT PCR NEGATIVE NEGATIVE Final    Comment: (NOTE) SARS-CoV-2 target nucleic acids are NOT DETECTED.  The SARS-CoV-2 RNA is generally detectable in upper respiratory specimens during the acute phase of infection. The lowest concentration of SARS-CoV-2 viral copies this assay can detect is 138 copies/mL. A negative result does not preclude SARS-Cov-2 infection and should not be used as the sole basis for treatment or other patient management decisions. A negative result may occur with  improper specimen collection/handling, submission of specimen other than nasopharyngeal swab, presence of viral mutation(s) within the areas targeted by this assay, and inadequate number of viral copies(<138 copies/mL). A negative result must be combined with clinical observations, patient history, and  epidemiological information. The expected result is Negative.  Fact Sheet for Patients:  EntrepreneurPulse.com.au  Fact Sheet for Healthcare Providers:  IncredibleEmployment.be  This test is no t yet approved or cleared by the [place] FDA and  has been authorized for detection and/or diagnosis of SARS-CoV-2 by FDA under an Emergency Use Authorization (EUA). This EUA will remain  in effect (meaning  this test can be used) for the duration of the COVID-19 declaration under Section 564(b)(1) of the Act, 21 U.S.C.section 360bbb-3(b)(1), unless the authorization is terminated  or revoked sooner.       Influenza A by PCR NEGATIVE NEGATIVE Final   Influenza B by PCR NEGATIVE NEGATIVE Final    Comment: (NOTE) The Xpert Xpress SARS-CoV-2/FLU/RSV plus assay is intended as an aid in the diagnosis of influenza from Nasopharyngeal swab specimens and should not be used as a sole basis for treatment. Nasal washings and aspirates are unacceptable for Xpert Xpress SARS-CoV-2/FLU/RSV testing.  Fact Sheet for Patients: EntrepreneurPulse.com.au  Fact Sheet for Healthcare Providers: IncredibleEmployment.be  This test is not yet approved or cleared by the [place] FDA and has been authorized for detection and/or diagnosis of SARS-CoV-2 by FDA under an Emergency Use Authorization (EUA). This EUA will remain in effect (meaning this test can be used) for the duration of the COVID-19 declaration under Section 564(b)(1) of the Act, 21 U.S.C. section 360bbb-3(b)(1), unless the authorization is terminated or revoked.     Resp Syncytial Virus by PCR NEGATIVE NEGATIVE Final    Comment: (NOTE) Fact Sheet for Patients: EntrepreneurPulse.com.au  Fact Sheet for Healthcare Providers: IncredibleEmployment.be  This test is not yet approved or cleared by the [place] FDA and has been  authorized for detection and/or diagnosis of SARS-CoV-2 by FDA under an Emergency Use Authorization (EUA). This EUA will remain in effect (meaning this test can be used) for the duration of the COVID-19 declaration under Section 564(b)(1) of the Act, 21 U.S.C. section 360bbb-3(b)(1), unless the authorization is terminated or revoked.  Performed at [facility], [place] [address], [place], [place] [address]     SIGNED:   Charlynne Cousins, MD  [facility] 07/24/2022, 8:22 AM Pager   If 7PM-7AM, please contact night-coverage [email or web address] Password [email or web address]

## 2022-07-24 NOTE — Progress Notes (Signed)
Subjective: No complaints.  Objective: Vital signs in last 24 hours: Temp:  [97.4 F (36.3 C)-98.7 F (37.1 C)] 97.4 F (36.3 C) (01/14 0347) Pulse Rate:  [109-118] 110 (01/14 0347) Resp:  [16-25] 20 (01/14 0347) BP: (106-157)/(88-101) 106/88 (01/14 0347) SpO2:  [96 %-100 %] 96 % (01/14 0347) Last BM Date : 07/23/22  Intake/Output from previous day: No intake/output data recorded. Intake/Output this shift: No intake/output data recorded.  General appearance: alert and no distress GI: soft, non-tender; bowel sounds normal; no masses,  no organomegaly  Lab Results: Recent Labs    07/23/22 0540 07/23/22 1428 07/23/22 2134  WBC 3.0* 3.2* 3.5*  HGB 7.9* 7.9* 7.6*  HCT 23.3* 24.6* 22.7*  PLT 86* 90* 84*   BMET Recent Labs    07/22/22 0830 07/23/22 0540 07/24/22 0109  NA 139 138 139  K 5.1 5.0 5.8*  CL 98 99 99  CO2 '26 22 25  '$ GLUCOSE 81 86 90  BUN 94* 107* 116*  CREATININE 13.50* 14.46* 15.68*  CALCIUM 8.1* 7.7* 8.4*   LFT Recent Labs    07/21/22 1840 07/22/22 0239 07/24/22 0109  PROT 6.5  --   --   ALBUMIN 2.9*   < > 2.8*  AST 10*  --   --   ALT 14  --   --   ALKPHOS 87  --   --   BILITOT 0.8  --   --    < > = values in this interval not displayed.   PT/INR No results for input(s): "LABPROT", "INR" in the last 72 hours. Hepatitis Panel No results for input(s): "HEPBSAG", "HCVAB", "HEPAIGM", "HEPBIGM" in the last 72 hours. C-Diff No results for input(s): "CDIFFTOX" in the last 72 hours. Fecal Lactopherrin No results for input(s): "FECLLACTOFRN" in the last 72 hours.  Studies/Results: No results found.  Medications: Scheduled:  sodium chloride   Intravenous Once   carvedilol  12.5 mg Oral BID WC   pantoprazole  40 mg Oral Daily   saccharomyces boulardii  250 mg Oral BID   vancomycin  125 mg Oral QID   Followed by   Derrill Memo ON 08/05/2022] vancomycin  125 mg Oral BID   Followed by   Derrill Memo ON 08/12/2022] vancomycin  125 mg Oral Daily   Followed  by   Derrill Memo ON 08/19/2022] vancomycin  125 mg Oral QODAY   Followed by   Derrill Memo ON 08/27/2022] vancomycin  125 mg Oral Q3 days   Continuous:  Assessment/Plan: 1) Jejunal AVM s/p APC. 2) Anemia. 3) ESRD.   The patient remains stable.  His HGB for this AM is pending.  Provided that it is stable he can be discharged home.  He can resume ASA.  Plan: 1) Await HGB. 2) If stable he can be discharged home with [facility] follow up.  LOS: 3 days   Gerhart Ruggieri D 07/24/2022, 7:40 AM

## 2022-07-24 NOTE — TOC Transition Note (Signed)
Transition of Care [facility]) - CM/SW Discharge Note   Patient Details  Name: Raymond [name] MRN: [id number] Date of Birth: March 02, 1956  [facility] CM/SW Contact:  Bartholomew Crews, RN Phone Number: [phone] 07/24/2022, 1:30 PM   Clinical Narrative:     Spoke with patient at the bedside to discuss post acute transition needs. Patient unable to get a ride home. He could use the bus, but would still have to walk a mile to his home. Confirmed street address. Waiver signed. Taxi voucher provided to discharge nurse. No further TOC needs identified at this time.   Final next level of care: Home/Self Care Barriers to Discharge: No Barriers Identified   Patient Goals and CMS Choice CMS Medicare.gov Compare Post Acute Care list provided to:: Patient Choice offered to / list presented to : NA  Discharge Placement                         Discharge Plan and Services Additional resources added to the After Visit Summary for                  DME Arranged: N/A DME Agency: NA       HH Arranged: NA HH Agency: NA        Social Determinants of Health (SDOH) Interventions SDOH Screenings   Food Insecurity: No Food Insecurity (12/29/2021)  Housing: Low Risk  (12/29/2021)  Transportation Needs: Unmet Transportation Needs (07/24/2022)  Financial Resource Strain: Low Risk  (12/29/2021)  Social Connections: Unknown (08/18/2018)  Tobacco Use: High Risk (07/22/2022)     Readmission Risk Interventions    07/07/2022    1:00 PM 12/01/2021   10:21 AM 11/26/2021    5:07 PM  Readmission Risk Prevention Plan  Transportation Screening Complete  Complete  Medication Review Press photographer) Complete  Complete  PCP or Specialist appointment within 3-5 days of discharge Complete Complete   HRI or [place] Complete Complete   SW Recovery Care/Counseling Consult Complete  Complete  Palliative Care Screening Not Applicable  Not [place] Not  Applicable Not Applicable

## 2022-07-30 ENCOUNTER — Encounter (HOSPITAL_COMMUNITY): Payer: Self-pay | Admitting: Emergency Medicine

## 2022-07-30 ENCOUNTER — Emergency Department (HOSPITAL_COMMUNITY)
Admission: EM | Admit: 2022-07-30 | Discharge: 2022-07-30 | Disposition: A | Discharge status: Home / Self Care | Payer: Medicare HMO | Attending: Emergency Medicine | Admitting: Emergency Medicine

## 2022-07-30 ENCOUNTER — Emergency Department (HOSPITAL_COMMUNITY): Payer: Medicare HMO

## 2022-07-30 ENCOUNTER — Other Ambulatory Visit: Payer: Self-pay

## 2022-07-30 DIAGNOSIS — I2510 Atherosclerotic heart disease of native coronary artery without angina pectoris: Secondary | ICD-10-CM | POA: Diagnosis not present

## 2022-07-30 DIAGNOSIS — N186 End stage renal disease: Secondary | ICD-10-CM

## 2022-07-30 DIAGNOSIS — R0789 Other chest pain: Secondary | ICD-10-CM | POA: Insufficient documentation

## 2022-07-30 DIAGNOSIS — Z7982 Long term (current) use of aspirin: Secondary | ICD-10-CM | POA: Diagnosis not present

## 2022-07-30 DIAGNOSIS — Z992 Dependence on renal dialysis: Secondary | ICD-10-CM | POA: Diagnosis not present

## 2022-07-30 DIAGNOSIS — I1311 Hypertensive heart and chronic kidney disease without heart failure, with stage 5 chronic kidney disease, or end stage renal disease: Secondary | ICD-10-CM | POA: Insufficient documentation

## 2022-07-30 DIAGNOSIS — E876 Hypokalemia: Secondary | ICD-10-CM | POA: Insufficient documentation

## 2022-07-30 DIAGNOSIS — D649 Anemia, unspecified: Secondary | ICD-10-CM | POA: Insufficient documentation

## 2022-07-30 DIAGNOSIS — E1122 Type 2 diabetes mellitus with diabetic chronic kidney disease: Secondary | ICD-10-CM | POA: Insufficient documentation

## 2022-07-30 DIAGNOSIS — Z79899 Other long term (current) drug therapy: Secondary | ICD-10-CM | POA: Diagnosis not present

## 2022-07-30 DIAGNOSIS — D72819 Decreased white blood cell count, unspecified: Secondary | ICD-10-CM | POA: Diagnosis not present

## 2022-07-30 DIAGNOSIS — J449 Chronic obstructive pulmonary disease, unspecified: Secondary | ICD-10-CM | POA: Insufficient documentation

## 2022-07-30 DIAGNOSIS — I120 Hypertensive chronic kidney disease with stage 5 chronic kidney disease or end stage renal disease: Secondary | ICD-10-CM | POA: Insufficient documentation

## 2022-07-30 LAB — BASIC METABOLIC PANEL
Anion gap: 10 (ref 5–15)
BUN: 14 mg/dL (ref 8–23)
CO2: 33 mmol/L — ABNORMAL HIGH (ref 22–32)
Calcium: 7.6 mg/dL — ABNORMAL LOW (ref 8.9–10.3)
Chloride: 94 mmol/L — ABNORMAL LOW (ref 98–111)
Creatinine, Ser: 5.64 mg/dL — ABNORMAL HIGH (ref 0.61–1.24)
GFR, Estimated: 10 mL/min — ABNORMAL LOW (ref >60)
Glucose, Bld: 82 mg/dL (ref 70–99)
Potassium: 3.3 mmol/L — ABNORMAL LOW (ref 3.5–5.1)
Sodium: 137 mmol/L (ref 135–145)

## 2022-07-30 LAB — TROPONIN I (HIGH SENSITIVITY)
Troponin I (High Sensitivity): 24 ng/L — ABNORMAL HIGH (ref <18)
Troponin I (High Sensitivity): 23 ng/L — ABNORMAL HIGH (ref <18)

## 2022-07-30 LAB — CBC
HCT: 24.8 % — ABNORMAL LOW (ref 39.0–52.0)
Hemoglobin: 7.5 g/dL — ABNORMAL LOW (ref 13.0–17.0)
MCH: 29.5 pg (ref 26.0–34.0)
MCHC: 30.2 g/dL (ref 30.0–36.0)
MCV: 97.6 fL (ref 80.0–100.0)
Platelets: 139 10*3/uL — ABNORMAL LOW (ref 150–400)
RBC: 2.54 MIL/uL — ABNORMAL LOW (ref 4.22–5.81)
RDW: 17.5 % — ABNORMAL HIGH (ref 11.5–15.5)
WBC: 2.9 10*3/uL — ABNORMAL LOW (ref 4.0–10.5)
nRBC: 0 % (ref 0.0–0.2)

## 2022-07-30 LAB — BRAIN NATRIURETIC PEPTIDE: B Natriuretic Peptide: 1497 pg/mL — ABNORMAL HIGH (ref 0.0–100.0)

## 2022-07-30 MED ORDER — PANTOPRAZOLE SODIUM 40 MG PO TBEC: 40.0000 mg | DELAYED_RELEASE_TABLET | Freq: Every day | ORAL | Status: DC

## 2022-07-30 MED ORDER — LIDOCAINE VISCOUS HCL 2 % MT SOLN
15.0000 mL | Freq: Once | OROMUCOSAL | Status: AC
  Administered 2022-07-30: 15 mL via ORAL
  Filled 2022-07-30: qty 15

## 2022-07-30 MED ORDER — ALUM & MAG HYDROXIDE-SIMETH 200-200-20 MG/5ML PO SUSP
30.0000 mL | Freq: Once | ORAL | Status: AC
  Administered 2022-07-30: 30 mL via ORAL
  Filled 2022-07-30: qty 30

## 2022-07-30 MED ORDER — FAMOTIDINE 10 MG PO TABS: 10.0000 mg | ORAL_TABLET | Freq: Every day | ORAL | 0 refills | Status: DC

## 2022-07-30 NOTE — ED Provider Notes (Signed)
[place] Provider Note   CSN: [id number] Arrival date & time: 07/30/22  1455     History  Chief Complaint  Patient presents with   Chest Pain    Raymond [name] is a 67 y.o. male.   Chest Pain    67 year old male with medical history significant for HTN, substance abuse, ESRD on HD MWF, COPD, nonobstructive CAD, DM 2 presenting to the emergency department with chest pain.  The patient states that the pain has been ongoing for the past 2 days.  He states that he underwent dialysis and had chest pain during dialysis.  He describes it as sharp, nonradiating pain.  Aspirin and nitroglycerin did not resolve the pain.  He felt that it could be reflux related.  He thought that it was possibly worse when lying flat over the past few days.  He denies any fevers or chills.  Denies any cough or shortness of breath.  His dialysis session was cut short due to the pain.  Home Medications Prior to Admission medications   Medication Sig Start Date End Date Taking? Authorizing Provider  famotidine (PEPCID) 10 MG tablet Take 1 tablet (10 mg total) by mouth daily for 14 days. 07/30/22 08/13/22 Yes Regan Lemming, MD  albuterol (VENTOLIN HFA) 108 (90 Base) MCG/ACT inhaler Inhale 3 puffs into the lungs daily as needed for shortness of breath or wheezing. 11/06/19   [provider]  aspirin 81 MG chewable tablet Chew 1 tablet (81 mg total) by mouth daily. 07/26/22   Charlynne Cousins, MD  atorvastatin (LIPITOR) 40 MG tablet Take 1 tablet (40 mg total) by mouth daily. 07/09/22 10/07/22  Mariel Aloe, MD  BREO ELLIPTA 200-25 MCG/ACT AEPB Inhale 2 puffs into the lungs daily as needed (for shortness of breath). 09/17/21   [provider]  calcitRIOL (ROCALTROL) 0.5 MCG capsule Take 1 capsule (0.5 mcg total) by mouth Every Tuesday,Thursday,and Saturday with dialysis. 12/02/21   Mariel Aloe, MD  carvedilol (COREG) 12.5 MG tablet Take 2 tablets (25  mg total) by mouth every morning AND 1 tablet (12.5 mg total) every evening. 12/29/21   Deberah Pelton, NP  cinacalcet (SENSIPAR) 30 MG tablet Take 6 tablets (180 mg total) by mouth Every Tuesday,Thursday,and Saturday with dialysis. 12/12/20   Mercy Riding, MD  Darbepoetin Alfa (ARANESP) 200 MCG/0.4ML SOSY injection Inject 0.4 mLs (200 mcg total) into the vein every Tuesday with hemodialysis. 05/25/21   France Ravens, MD  hydrALAZINE (APRESOLINE) 10 MG tablet Take 1 tablet (10 mg total) by mouth every 8 (eight) hours. 07/08/22 10/06/22  Mariel Aloe, MD  isosorbide mononitrate (IMDUR) 30 MG 24 hr tablet Take 1/2 tablet (15 mg total) by mouth daily. 07/09/22 10/07/22  Mariel Aloe, MD  loperamide (IMODIUM) 2 MG capsule Take 2 mg by mouth as needed for diarrhea or loose stools. 07/18/22   [provider]  metroNIDAZOLE (FLAGYL) 500 MG tablet Take 500 mg by mouth 3 (three) times daily. 07/18/22   [provider]  multivitamin (RENA-VIT) TABS tablet Take 1 tablet by mouth at bedtime. 09/17/21   Sheikh, Omair Latif, DO  pantoprazole (PROTONIX) 40 MG tablet Take 1 tablet (40 mg total) by mouth daily. 07/30/22   Regan Lemming, MD  sertraline (ZOLOFT) 25 MG tablet Take 25 mg by mouth daily. 12/07/21   [provider]  sevelamer carbonate (RENVELA) 800 MG tablet Take 5 tablets (4,000 mg total) by mouth 3 (three)  times daily with meals. 09/17/21   Raiford Noble Latif, DO  apixaban (ELIQUIS) 5 MG TABS tablet Take 1 tablet (5 mg total) by mouth 2 (two) times daily. 06/22/20 08/03/20  Karoline Caldwell, PA-C      Allergies    Patient has no known allergies.    Review of Systems   Review of Systems  Cardiovascular:  Positive for chest pain.  All other systems reviewed and are negative.   Physical Exam Updated Vital Signs BP (!) 175/99 (BP Location: Right Arm)   Pulse 88   Temp 98.4 F (36.9 C) (Oral)   Resp 14   SpO2 97%  Physical Exam Vitals and nursing note reviewed.   Constitutional:      General: He is not in acute distress.    Appearance: He is well-developed.  HENT:     Head: Normocephalic and atraumatic.  Eyes:     Conjunctiva/sclera: Conjunctivae normal.  Cardiovascular:     Rate and Rhythm: Normal rate and regular rhythm.     Heart sounds: No murmur heard. Pulmonary:     Effort: Pulmonary effort is normal. No respiratory distress.     Breath sounds: Normal breath sounds.  Chest:     Comments: No reproducible chest wall tenderness, no rash Abdominal:     Palpations: Abdomen is soft.     Tenderness: There is no abdominal tenderness.  Musculoskeletal:        General: No swelling.     Cervical back: Neck supple.  Skin:    General: Skin is warm and dry.     Capillary Refill: Capillary refill takes less than 2 seconds.  Neurological:     Mental Status: He is alert.  Psychiatric:        Mood and Affect: Mood normal.     ED Results / Procedures / Treatments   Labs (all labs ordered are listed, but only abnormal results are displayed) Labs Reviewed  BASIC METABOLIC PANEL - Abnormal; Notable for the following components:      Result Value   Potassium 3.3 (*)    Chloride 94 (*)    CO2 33 (*)    Creatinine, Ser 5.64 (*)    Calcium 7.6 (*)    GFR, Estimated 10 (*)    All other components within normal limits  CBC - Abnormal; Notable for the following components:   WBC 2.9 (*)    RBC 2.54 (*)    Hemoglobin 7.5 (*)    HCT 24.8 (*)    RDW 17.5 (*)    Platelets 139 (*)    All other components within normal limits  BRAIN NATRIURETIC PEPTIDE - Abnormal; Notable for the following components:   B Natriuretic Peptide 1,497.0 (*)    All other components within normal limits  TROPONIN I (HIGH SENSITIVITY) - Abnormal; Notable for the following components:   Troponin I (High Sensitivity) 24 (*)    All other components within normal limits  TROPONIN I (HIGH SENSITIVITY) - Abnormal; Notable for the following components:   Troponin I (High  Sensitivity) 23 (*)    All other components within normal limits    EKG EKG Interpretation  Date/Time:  Saturday July 30 2022 15:49:12 EST Ventricular Rate:  87 PR Interval:    QRS Duration: 94 QT Interval:  446 QTC Calculation: 536 R Axis:   24 Text Interpretation: Sinus rhythm Prolonged QT Abnormal ECG When compared with ECG of 21-Jul-2022 20:29, No significant change since last tracing Confirmed by Regan Lemming ([id number])  on 07/30/2022 5:51:33 PM  Radiology DG Chest 2 View  Result Date: 07/30/2022 CLINICAL DATA:  Chest pain. EXAM: CHEST - 2 VIEW COMPARISON:  July 21, 2022 FINDINGS: The there is moderate to marked severity enlargement of the cardiac silhouette. Marked severity calcification of the thoracic aorta is seen. Mild atelectasis is noted within the bilateral lung bases. There is no evidence of a pleural effusion or pneumothorax. A chronic deformity is seen involving the surgical neck of the proximal right humerus. IMPRESSION: 1. Stable moderate to marked severity cardiomegaly. 2. Mild bibasilar atelectasis. Electronically Signed   By: Virgina Norfolk M.D.   On: 07/30/2022 16:42    Procedures Procedures    Medications Ordered in ED Medications  alum & mag hydroxide-simeth (MAALOX/MYLANTA) 200-200-20 MG/5ML suspension 30 mL (30 mLs Oral Given 07/30/22 1952)    And  lidocaine (XYLOCAINE) 2 % viscous mouth solution 15 mL (15 mLs Oral Given 07/30/22 1953)    ED Course/ Medical Decision Making/ A&P                             Medical Decision Making Risk OTC drugs. Prescription drug management.    67 year old male with medical history significant for HTN, substance abuse, ESRD on HD MWF, COPD, nonobstructive CAD, DM 2 presenting to the emergency department with chest pain.  The patient states that the pain has been ongoing for the past 2 days.  He states that he underwent dialysis and had chest pain during dialysis.  He describes it as sharp, nonradiating pain.   Aspirin and nitroglycerin did not resolve the pain.  He felt that it could be reflux related.  He thought that it was possibly worse when lying flat over the past few days.  He denies any fevers or chills.  Denies any cough or shortness of breath.  His dialysis session was cut short due to the pain.  Vitals and telemetry on arrival: Afebrile, not tachycardic or tachypneic, BP 163/93, saturating 100% on room air, sinus rhythm noted on cardiac telemetry  Pertinent exam findings include: Unremarkable, lungs clear to auscultation bilaterally, no reproducible chest wall tenderness  Differential diagnosis includes: Considered most likely GERD, less likely ACS, pneumonia, pneumothorax, pulmonary embolism,pericarditis/myocarditis, PUD, musculoskeletal.  Patient given ASA 325 mg,  given nitroglycerin at dialysis  EKG: Normal sinus rhythm with a rate of 87 and no evidence of acute ischemic changes, prolonged Qtc noted to 536. No concerning change from prior  Lab results include: Initial troponin of 24, repeat downtrending at 23, CBC with a leukopenia to 2.9, stable anemia to 7.5, BMP with mild hypokalemia to 3.3.   Labs with mildly elevated troponins that are downtrending in the setting of the patient's known ESRD and hypervolemia.  Symptoms worse when lying flat, better when sitting up, considered uremia associated pericarditis although patient has no friction rub on exam, no EKG changes concerning for pericarditis.  Unlikely pneumonia, no cough, no leukocytosis, no fevers, CXR and exam without acute findings. Unlikely pneumothorax, no findings on  CXR. Chest pain not exertional. Unlikely dissection, no pulse deficit, no tearing chest pain, no neurologic complaints. Low concern for ACS with mildly elevated but downtrending troponins.   Imaging results include: CXR: IMPRESSION:  1. Stable moderate to marked severity cardiomegaly.  2. Mild bibasilar atelectasis.    Course of tx has consisted of: She  administered Maalox and viscous lidocaine with improvement in her symptoms.  Patient has no PR depressions or  global ST segment elevations to suggest pericarditis.  His symptoms could be consistent with reflux.  Low concern for ACS or PE patient has no cough or shortness of breath.  Chest x-ray was unremarkable.   I discussed the patient's care with on-call cardiology and reviewed the workup thus far. Spoke with Dr. Vickki Muff regarding the patient who agreed he could follow-up outpatient with cardiology in clinic.  Return precautions provided, stable appearing, pain-free on repeat assessment.  Stable for discharge.   Final Clinical Impression(s) / ED Diagnoses Final diagnoses:  ESRD (end stage renal disease) ([place])  Atypical chest pain    Rx / DC Orders ED Discharge Orders          Ordered    pantoprazole (PROTONIX) 40 MG tablet  Daily        07/30/22 1935    famotidine (PEPCID) 10 MG tablet  Daily        07/30/22 1935    Ambulatory referral to Cardiology       Comments: If you have not heard from the Cardiology office within the next 72 hours please call [phone].   07/30/22 8295              Regan Lemming, MD 07/31/22 1438

## 2022-07-30 NOTE — Discharge Instructions (Addendum)
Please follow-up outpatient with cardiology, your cardiac enzymes were mildly elevated today but were down-trending.

## 2022-07-30 NOTE — ED Provider Triage Note (Signed)
Emergency Medicine Provider Triage Evaluation Note  Raymond [name] , a 67 y.o. male  was evaluated in triage.  Pt complains of chest pain.  It is retrosternal, radiates to his left shoulder.  Going on for 2 days, had dialysis today he states his session was cut short due to worsening chest pain.  Does not radiate straight to the back, no loss of consciousness, took aspirin and nitro with no improvement..  Review of Systems  Per HPI  Physical Exam  There were no vitals taken for this visit. Gen:   Awake, no distress   Resp:  Normal effort  MSK:   Moves extremities without difficulty  Other:    Medical Decision Making  Medically screening exam initiated at 3:23 PM.  Appropriate orders placed.  Manson E Creer was informed that the remainder of the evaluation will be completed by another provider, this initial triage assessment does not replace that evaluation, and the importance of remaining in the ED until their evaluation is complete.     Sherrill Raring, PA-C 07/30/22 1524

## 2022-07-30 NOTE — ED Triage Notes (Signed)
Pt BIB [facility] from dialysis center x 1 week with reports of chest pain and elevated K+. Pt had full dialysis tx today.

## 2022-08-20 ENCOUNTER — Emergency Department (HOSPITAL_COMMUNITY): Payer: Medicare HMO

## 2022-08-20 ENCOUNTER — Encounter (HOSPITAL_COMMUNITY): Payer: Self-pay | Admitting: Family Medicine

## 2022-08-20 ENCOUNTER — Other Ambulatory Visit: Payer: Self-pay

## 2022-08-20 ENCOUNTER — Observation Stay (HOSPITAL_COMMUNITY)
Admission: EM | Admit: 2022-08-20 | Discharge: 2022-08-29 | Disposition: A | Discharge status: Skilled Nursing Facility | Payer: Medicare HMO | Attending: Internal Medicine | Admitting: Internal Medicine

## 2022-08-20 DIAGNOSIS — F17210 Nicotine dependence, cigarettes, uncomplicated: Secondary | ICD-10-CM | POA: Insufficient documentation

## 2022-08-20 DIAGNOSIS — Z8679 Personal history of other diseases of the circulatory system: Secondary | ICD-10-CM | POA: Diagnosis not present

## 2022-08-20 DIAGNOSIS — F1910 Other psychoactive substance abuse, uncomplicated: Secondary | ICD-10-CM | POA: Diagnosis present

## 2022-08-20 DIAGNOSIS — G9340 Encephalopathy, unspecified: Secondary | ICD-10-CM | POA: Diagnosis not present

## 2022-08-20 DIAGNOSIS — Z79899 Other long term (current) drug therapy: Secondary | ICD-10-CM | POA: Insufficient documentation

## 2022-08-20 DIAGNOSIS — Z992 Dependence on renal dialysis: Secondary | ICD-10-CM | POA: Diagnosis not present

## 2022-08-20 DIAGNOSIS — I480 Paroxysmal atrial fibrillation: Secondary | ICD-10-CM | POA: Diagnosis present

## 2022-08-20 DIAGNOSIS — R4182 Altered mental status, unspecified: Secondary | ICD-10-CM | POA: Diagnosis present

## 2022-08-20 DIAGNOSIS — I4581 Long QT syndrome: Secondary | ICD-10-CM | POA: Insufficient documentation

## 2022-08-20 DIAGNOSIS — Z7982 Long term (current) use of aspirin: Secondary | ICD-10-CM | POA: Insufficient documentation

## 2022-08-20 DIAGNOSIS — E1122 Type 2 diabetes mellitus with diabetic chronic kidney disease: Secondary | ICD-10-CM | POA: Insufficient documentation

## 2022-08-20 DIAGNOSIS — J449 Chronic obstructive pulmonary disease, unspecified: Secondary | ICD-10-CM | POA: Diagnosis present

## 2022-08-20 DIAGNOSIS — D61818 Other pancytopenia: Secondary | ICD-10-CM | POA: Diagnosis present

## 2022-08-20 DIAGNOSIS — R9431 Abnormal electrocardiogram [ECG] [EKG]: Secondary | ICD-10-CM | POA: Diagnosis present

## 2022-08-20 DIAGNOSIS — N186 End stage renal disease: Secondary | ICD-10-CM | POA: Diagnosis not present

## 2022-08-20 DIAGNOSIS — I2510 Atherosclerotic heart disease of native coronary artery without angina pectoris: Secondary | ICD-10-CM | POA: Diagnosis not present

## 2022-08-20 DIAGNOSIS — I120 Hypertensive chronic kidney disease with stage 5 chronic kidney disease or end stage renal disease: Secondary | ICD-10-CM | POA: Insufficient documentation

## 2022-08-20 DIAGNOSIS — I10 Essential (primary) hypertension: Secondary | ICD-10-CM | POA: Diagnosis present

## 2022-08-20 LAB — VITAMIN B12: Vitamin B-12: 1982 pg/mL — ABNORMAL HIGH (ref 180–914)

## 2022-08-20 LAB — I-STAT VENOUS BLOOD GAS, ED
Acid-Base Excess: 10 mmol/L — ABNORMAL HIGH (ref 0.0–2.0)
Bicarbonate: 36 mmol/L — ABNORMAL HIGH (ref 20.0–28.0)
Calcium, Ion: 0.98 mmol/L — ABNORMAL LOW (ref 1.15–1.40)
HCT: 32 % — ABNORMAL LOW (ref 39.0–52.0)
Hemoglobin: 10.9 g/dL — ABNORMAL LOW (ref 13.0–17.0)
O2 Saturation: 79 %
Potassium: 3.6 mmol/L (ref 3.5–5.1)
Sodium: 138 mmol/L (ref 135–145)
TCO2: 38 mmol/L — ABNORMAL HIGH (ref 22–32)
pCO2, Ven: 54 mmHg (ref 44–60)
pH, Ven: 7.432 — ABNORMAL HIGH (ref 7.25–7.43)
pO2, Ven: 44 mmHg (ref 32–45)

## 2022-08-20 LAB — COMPREHENSIVE METABOLIC PANEL
ALT: 9 U/L (ref 0–44)
AST: 11 U/L — ABNORMAL LOW (ref 15–41)
Albumin: 3.2 g/dL — ABNORMAL LOW (ref 3.5–5.0)
Alkaline Phosphatase: 85 U/L (ref 38–126)
Anion gap: 12 (ref 5–15)
BUN: 24 mg/dL — ABNORMAL HIGH (ref 8–23)
CO2: 32 mmol/L (ref 22–32)
Calcium: 8.3 mg/dL — ABNORMAL LOW (ref 8.9–10.3)
Chloride: 93 mmol/L — ABNORMAL LOW (ref 98–111)
Creatinine, Ser: 7.12 mg/dL — ABNORMAL HIGH (ref 0.61–1.24)
GFR, Estimated: 8 mL/min — ABNORMAL LOW (ref >60)
Glucose, Bld: 87 mg/dL (ref 70–99)
Potassium: 3.5 mmol/L (ref 3.5–5.1)
Sodium: 137 mmol/L (ref 135–145)
Total Bilirubin: 0.3 mg/dL (ref 0.3–1.2)
Total Protein: 7.6 g/dL (ref 6.5–8.1)

## 2022-08-20 LAB — CBC WITH DIFFERENTIAL/PLATELET
Abs Immature Granulocytes: 0.01 10*3/uL (ref 0.00–0.07)
Basophils Absolute: 0 10*3/uL (ref 0.0–0.1)
Basophils Relative: 0 %
Eosinophils Absolute: 0.1 10*3/uL (ref 0.0–0.5)
Eosinophils Relative: 2 %
HCT: 29.8 % — ABNORMAL LOW (ref 39.0–52.0)
Hemoglobin: 9.4 g/dL — ABNORMAL LOW (ref 13.0–17.0)
Immature Granulocytes: 0 %
Lymphocytes Relative: 31 %
Lymphs Abs: 0.7 10*3/uL (ref 0.7–4.0)
MCH: 30.7 pg (ref 26.0–34.0)
MCHC: 31.5 g/dL (ref 30.0–36.0)
MCV: 97.4 fL (ref 80.0–100.0)
Monocytes Absolute: 0.5 10*3/uL (ref 0.1–1.0)
Monocytes Relative: 21 %
Neutro Abs: 1.1 10*3/uL — ABNORMAL LOW (ref 1.7–7.7)
Neutrophils Relative %: 46 %
Platelets: 142 10*3/uL — ABNORMAL LOW (ref 150–400)
RBC: 3.06 MIL/uL — ABNORMAL LOW (ref 4.22–5.81)
RDW: 18.3 % — ABNORMAL HIGH (ref 11.5–15.5)
WBC: 2.4 10*3/uL — ABNORMAL LOW (ref 4.0–10.5)
nRBC: 0 % (ref 0.0–0.2)

## 2022-08-20 LAB — T4, FREE: Free T4: 0.85 ng/dL (ref 0.61–1.12)

## 2022-08-20 LAB — ETHANOL: Alcohol, Ethyl (B): <10 mg/dL (ref <10)

## 2022-08-20 LAB — AMMONIA: Ammonia: 16 umol/L (ref 9–35)

## 2022-08-20 LAB — TSH: TSH: 3.018 u[IU]/mL (ref 0.350–4.500)

## 2022-08-20 LAB — CBG MONITORING, ED: Glucose-Capillary: 77 mg/dL (ref 70–99)

## 2022-08-20 LAB — MAGNESIUM: Magnesium: 2.1 mg/dL (ref 1.7–2.4)

## 2022-08-20 LAB — PHOSPHORUS: Phosphorus: 3.2 mg/dL (ref 2.5–4.6)

## 2022-08-20 MED ORDER — SEVELAMER CARBONATE 800 MG PO TABS
4000.0000 mg | ORAL_TABLET | Freq: Three times a day (TID) | ORAL | Status: DC
  Administered 2022-08-21 – 2022-08-29 (×19): 4000 mg via ORAL
  Filled 2022-08-20 (×21): qty 5

## 2022-08-20 MED ORDER — ACETAMINOPHEN 325 MG PO TABS: 650.0000 mg | ORAL_TABLET | Freq: Four times a day (QID) | ORAL | Status: DC | PRN

## 2022-08-20 MED ORDER — FLUTICASONE FUROATE-VILANTEROL 200-25 MCG/ACT IN AEPB
1.0000 | INHALATION_SPRAY | Freq: Every day | RESPIRATORY_TRACT | Status: DC
  Administered 2022-08-21 – 2022-08-29 (×9): 1 via RESPIRATORY_TRACT
  Filled 2022-08-20: qty 28

## 2022-08-20 MED ORDER — POTASSIUM CHLORIDE 10 MEQ/100ML IV SOLN
10.0000 meq | Freq: Once | INTRAVENOUS | Status: AC
  Administered 2022-08-20: 10 meq via INTRAVENOUS
  Filled 2022-08-20: qty 100

## 2022-08-20 MED ORDER — LABETALOL HCL 5 MG/ML IV SOLN: 10.0000 mg | INTRAVENOUS | Status: DC | PRN

## 2022-08-20 MED ORDER — HEPARIN SODIUM (PORCINE) 5000 UNIT/ML IJ SOLN
5000.0000 [IU] | Freq: Three times a day (TID) | INTRAMUSCULAR | Status: DC
  Administered 2022-08-20 – 2022-08-29 (×26): 5000 [IU] via SUBCUTANEOUS
  Filled 2022-08-20 (×27): qty 1

## 2022-08-20 MED ORDER — HYDRALAZINE HCL 10 MG PO TABS
10.0000 mg | ORAL_TABLET | Freq: Three times a day (TID) | ORAL | Status: DC
  Administered 2022-08-20 – 2022-08-24 (×12): 10 mg via ORAL
  Filled 2022-08-20 (×12): qty 1

## 2022-08-20 MED ORDER — ACETAMINOPHEN 650 MG RE SUPP: 650.0000 mg | Freq: Four times a day (QID) | RECTAL | Status: DC | PRN

## 2022-08-20 MED ORDER — ALBUTEROL SULFATE HFA 108 (90 BASE) MCG/ACT IN AERS: 2.0000 | INHALATION_SPRAY | RESPIRATORY_TRACT | Status: DC | PRN

## 2022-08-20 MED ORDER — SODIUM CHLORIDE 0.9% FLUSH
3.0000 mL | Freq: Two times a day (BID) | INTRAVENOUS | Status: DC
  Administered 2022-08-20 – 2022-08-26 (×13): 3 mL via INTRAVENOUS

## 2022-08-20 MED ORDER — ISOSORBIDE MONONITRATE ER 30 MG PO TB24
15.0000 mg | ORAL_TABLET | Freq: Every day | ORAL | Status: DC
  Administered 2022-08-21 – 2022-08-29 (×9): 15 mg via ORAL
  Filled 2022-08-20 (×9): qty 1

## 2022-08-20 MED ORDER — ALBUTEROL SULFATE (2.5 MG/3ML) 0.083% IN NEBU: 2.5000 mg | INHALATION_SOLUTION | RESPIRATORY_TRACT | Status: DC | PRN

## 2022-08-20 MED ORDER — ATORVASTATIN CALCIUM 40 MG PO TABS
40.0000 mg | ORAL_TABLET | Freq: Every day | ORAL | Status: DC
  Administered 2022-08-21 – 2022-08-29 (×9): 40 mg via ORAL
  Filled 2022-08-20 (×9): qty 1

## 2022-08-20 MED ORDER — PANTOPRAZOLE SODIUM 40 MG PO TBEC
40.0000 mg | DELAYED_RELEASE_TABLET | Freq: Every day | ORAL | Status: DC
  Administered 2022-08-21 – 2022-08-29 (×9): 40 mg via ORAL
  Filled 2022-08-20 (×9): qty 1

## 2022-08-20 MED ORDER — ASPIRIN 81 MG PO CHEW
81.0000 mg | CHEWABLE_TABLET | Freq: Every day | ORAL | Status: DC
  Administered 2022-08-21 – 2022-08-29 (×9): 81 mg via ORAL
  Filled 2022-08-20 (×9): qty 1

## 2022-08-20 MED ORDER — CARVEDILOL 12.5 MG PO TABS
12.5000 mg | ORAL_TABLET | Freq: Two times a day (BID) | ORAL | Status: DC
  Administered 2022-08-21 – 2022-08-24 (×8): 12.5 mg via ORAL
  Filled 2022-08-20 (×8): qty 1

## 2022-08-20 NOTE — ED Notes (Signed)
ED TO INPATIENT HANDOFF REPORT  ED Nurse Name and Phone #: Ashley Akin, RN [phone]  S Name/Age/Gender Raymond [name] 67 y.o. male Room/Bed: 022C/022C  Code Status   Code Status: Full Code  Home/SNF/Other Home Patient oriented to: self, place, time, and situation Is this baseline? Yes   Triage Complete: Triage complete  Chief Complaint Acute encephalopathy [G93.40]  Triage Note Pt BIB [facility] from dialysis center as he became less responsive towards end of treatment.  Pt normally AOX4 and able to walk on his own.  Facility reports he stated he hadn't eaten in a couple days.  Pt does live on his own.  VS BP 116/59, HR 70, CBG 115   Allergies No Known Allergies  Level of Care/Admitting Diagnosis ED Disposition     ED Disposition  Admit   Condition  --   Comment  Hospital Area: [facility] [[id number]]  Level of Care: Telemetry Medical [104]  May place patient in observation at [facility] or [place] if equivalent level of care is available:: No  Covid Evaluation: Asymptomatic - no recent exposure (last 10 days) testing not required  Diagnosis: Acute encephalopathy [id number]  Admitting Physician: Vianne Bulls [id number]  Attending Physician: Vianne Bulls [id number]          B Medical/Surgery History Past Medical History:  Diagnosis Date   Anemia    CAD (coronary artery disease)    a. 03/2018: cath showing 49 to 30% stenosis along the LAD, luminal irregularities along the RCA, and angiographically normal LCx   COPD (chronic obstructive pulmonary disease) ([place])    ESRD (end stage renal disease) on dialysis ([place])    "MWF; [facility]" (07/22/17)   Hepatitis C    Still positive s/p liver biopsy at [facility]  and interferon therapy for 6 months. Most recent lab work was on 10/24/12   Hepatitis C    "took the tx; gone now" (12/05/2016)  Was treated   History of blood transfusion ~ 2012/2013   "related to my kidneys; blood was low"    Hypertension    Peripheral arterial disease ([place])    Substance abuse ([place])    Thyroid disease    Type 2 diabetes mellitus with other diabetic kidney complication ([place]) [id number]   Past Surgical History:  Procedure Laterality Date   ABDOMINAL AORTOGRAM W/LOWER EXTREMITY N/A 02/08/2018   Procedure: ABDOMINAL AORTOGRAM W/LOWER EXTREMITY;  Surgeon: Conrad [place]man[place], MD;  Location: [place] [facility];  Service: Cardiovascular;  Laterality: N/A;   ABDOMINAL AORTOGRAM W/LOWER EXTREMITY Bilateral 06/15/2020   Procedure: ABDOMINAL AORTOGRAM W/LOWER EXTREMITY;  Surgeon: Waynetta Sandy, MD;  Location: [place] [facility];  Service: Cardiovascular;  Laterality: Bilateral;   AV FISTULA PLACEMENT Left Aug. 2013 ?   BIOPSY  01/17/2020   Procedure: BIOPSY;  Surgeon: Jackquline Denmark, MD;  Location: [place];  Service: Endoscopy;;   COLONOSCOPY WITH PROPOFOL N/A 08/21/2018   Procedure: COLONOSCOPY WITH PROPOFOL;  Surgeon: Yetta Flock, MD;  Location: [place];  Service: Gastroenterology;  Laterality: N/A;   ENTEROSCOPY N/A 01/17/2020   Procedure: ENTEROSCOPY;  Surgeon: Jackquline Denmark, MD;  Location: [facility] ENDOSCOPY;  Service: Endoscopy;  Laterality: N/A;   ENTEROSCOPY N/A 07/31/2020   Procedure: ENTEROSCOPY;  Surgeon: Doran Stabler, MD;  Location: [place];  Service: Gastroenterology;  Laterality: N/A;   ENTEROSCOPY N/A 01/27/2021   Procedure: ENTEROSCOPY;  Surgeon: Yetta Flock, MD;  Location: [facility] ENDOSCOPY;  Service: Gastroenterology;  Laterality: N/A;  ENTEROSCOPY N/A 07/22/2022   Procedure: ENTEROSCOPY;  Surgeon: Doran Stabler, MD;  Location: [facility] ENDOSCOPY;  Service: Gastroenterology;  Laterality: N/A;   ESOPHAGOGASTRODUODENOSCOPY (EGD) WITH PROPOFOL N/A 08/20/2018   Procedure: ESOPHAGOGASTRODUODENOSCOPY (EGD) WITH PROPOFOL;  Surgeon: Gatha Mayer, MD;  Location: [place];  Service: Endoscopy;  Laterality: N/A;   FEMORAL-POPLITEAL BYPASS GRAFT Left 04/11/2018    Procedure: BYPASS GRAFT FEMORAL-POPLITEAL ARTERY LEFT LEG;  Surgeon: Rosetta Posner, MD;  Location: [facility] OR;  Service: Vascular;  Laterality: Left;   GIVENS CAPSULE STUDY N/A 07/31/2020   Procedure: GIVENS CAPSULE STUDY;  Surgeon: Doran Stabler, MD;  Location: [place];  Service: Gastroenterology;  Laterality: N/A;   HOT HEMOSTASIS N/A 08/20/2018   Procedure: HOT HEMOSTASIS (ARGON PLASMA COAGULATION/BICAP);  Surgeon: Gatha Mayer, MD;  Location: [facility] ENDOSCOPY;  Service: Endoscopy;  Laterality: N/A;   HOT HEMOSTASIS N/A 01/17/2020   Procedure: HOT HEMOSTASIS (ARGON PLASMA COAGULATION/BICAP);  Surgeon: Jackquline Denmark, MD;  Location: [facility] ENDOSCOPY;  Service: Endoscopy;  Laterality: N/A;   HOT HEMOSTASIS N/A 01/27/2021   Procedure: HOT HEMOSTASIS (ARGON PLASMA COAGULATION/BICAP);  Surgeon: Yetta Flock, MD;  Location: [facility] ENDOSCOPY;  Service: Gastroenterology;  Laterality: N/A;   HOT HEMOSTASIS N/A 07/22/2022   Procedure: HOT HEMOSTASIS (ARGON PLASMA COAGULATION/BICAP);  Surgeon: Doran Stabler, MD;  Location: [place];  Service: Gastroenterology;  Laterality: N/A;   LEFT HEART CATH AND CORONARY ANGIOGRAPHY N/A 03/29/2018   Procedure: LEFT HEART CATH AND CORONARY ANGIOGRAPHY;  Surgeon: Nelva Bush, MD;  Location: [place] [facility];  Service: Cardiovascular;  Laterality: N/A;   LIVER BIOPSY     ORIF ULNAR FRACTURE Left 05/18/2017   Procedure: OPEN REDUCTION INTERNAL FIXATION (ORIF) ULNAR FRACTURE;  Surgeon: Iran Planas, MD;  Location: [place];  Service: Orthopedics;  Laterality: Left;   PERIPHERAL VASCULAR INTERVENTION Right 06/15/2020   Procedure: PERIPHERAL VASCULAR INTERVENTION;  Surgeon: Waynetta Sandy, MD;  Location: [place] [facility];  Service: Cardiovascular;  Laterality: Right;   POLYPECTOMY  08/21/2018   Procedure: POLYPECTOMY;  Surgeon: Yetta Flock, MD;  Location: [place];  Service: Gastroenterology;;   Earney Mallet N/A 12/11/2012   Procedure:  Earney Mal[place];  Surgeon: Serafina Mitchell, MD;  Location: [facility] CATH LAB;  Service: Cardiovascular;  Laterality: N/A;   WOUND EXPLORATION Left 06/15/2020   Procedure: GROIN  EXPLORATION;  Surgeon: Waynetta Sandy, MD;  Location: [place];  Service: Vascular;  Laterality: Left;     A IV Location/Drains/Wounds Patient Lines/Drains/Airways Status     Active Line/Drains/Airways     Name Placement date Placement time Site Days   Fistula / Graft Left Forearm Arteriovenous fistula 01/26/21  0104  Forearm  571            Intake/Output Last 24 hours No intake or output data in the 24 hours ending 08/20/22 2043  Labs/Imaging Results for orders placed or performed during the hospital encounter of 08/20/22 (from the past 48 hour(s))  Ethanol     Status: None   Collection Time: 08/20/22  3:56 PM  Result Value Ref Range   Alcohol, Ethyl (B) <10 <10 mg/dL    Comment: (NOTE) Lowest detectable limit for serum alcohol is 10 mg/dL.  For medical purposes only. Performed at [facility], [place] [address], [place], [place] [address]   CBC with Differential     Status: Abnormal   Collection Time: 08/20/22  4:28 PM  Result Value Ref Range   WBC 2.4 (L) 4.0 -  10.5 K/uL   RBC 3.06 (L) 4.22 - 5.81 MIL/uL   Hemoglobin 9.4 (L) 13.0 - 17.0 g/dL   HCT 29.8 (L) 39.0 - 52.0 %   MCV 97.4 80.0 - 100.0 fL   MCH 30.7 26.0 - 34.0 pg   MCHC 31.5 30.0 - 36.0 g/dL   RDW 18.3 (H) 11.5 - 15.5 %   Platelets 142 (L) 150 - 400 K/uL   nRBC 0.0 0.0 - 0.2 %   Neutrophils Relative % 46 %   Neutro Abs 1.1 (L) 1.7 - 7.7 K/uL   Lymphocytes Relative 31 %   Lymphs Abs 0.7 0.7 - 4.0 K/uL   Monocytes Relative 21 %   Monocytes Absolute 0.5 0.1 - 1.0 K/uL   Eosinophils Relative 2 %   Eosinophils Absolute 0.1 0.0 - 0.5 K/uL   Basophils Relative 0 %   Basophils Absolute 0.0 0.0 - 0.1 K/uL   Immature Granulocytes 0 %   Abs Immature Granulocytes 0.01 0.00 - 0.07 K/uL    Comment: Performed at [facility] [address] [place], [place] [address]  Comprehensive metabolic panel     Status: Abnormal   Collection Time: 08/20/22  4:28 PM  Result Value Ref Range   Sodium 137 135 - 145 mmol/L   Potassium 3.5 3.5 - 5.1 mmol/L   Chloride 93 (L) 98 - 111 mmol/L   CO2 32 22 - 32 mmol/L   Glucose, Bld 87 70 - 99 mg/dL    Comment: Glucose reference range applies only to samples taken after fasting for at least 8 hours.   BUN 24 (H) 8 - 23 mg/dL   Creatinine, Ser 7.12 (H) 0.61 - 1.24 mg/dL   Calcium 8.3 (L) 8.9 - 10.3 mg/dL   Total Protein 7.6 6.5 - 8.1 g/dL   Albumin 3.2 (L) 3.5 - 5.0 g/dL   AST 11 (L) 15 - 41 U/L   ALT 9 0 - 44 U/L   Alkaline Phosphatase 85 38 - 126 U/L   Total Bilirubin 0.3 0.3 - 1.2 mg/dL   GFR, Estimated 8 (L) >60 mL/min    Comment: (NOTE) Calculated using the CKD-EPI Creatinine Equation (2021)    Anion gap 12 5 - 15    Comment: Performed at [facility], [place] [address] [place], [place] [address]  Magnesium     Status: None   Collection Time: 08/20/22  4:28 PM  Result Value Ref Range   Magnesium 2.1 1.7 - 2.4 mg/dL    Comment: Performed at [facility], [place] [address], [place], [place] [address]  Phosphorus     Status: None   Collection Time: 08/20/22  4:28 PM  Result Value Ref Range   Phosphorus 3.2 2.5 - 4.6 mg/dL    Comment: Performed at [facility] [address], [place], [place] [address]  T4, free     Status: None   Collection Time: 08/20/22  4:28 PM  Result Value Ref Range   Free T4 0.85 0.61 - 1.12 ng/dL    Comment: (NOTE) Biotin ingestion may interfere with free T4 tests. If the results are inconsistent with the TSH level, previous test results, or the clinical presentation, then consider biotin interference. If needed, order repeat testing after stopping biotin. Performed at [facility], [place] [address] [place], [place] [address]   TSH     Status: None   Collection Time: 08/20/22  4:29 PM  Result Value Ref Range  TSH  3.018 0.350 - 4.500 uIU/mL    Comment: Performed by a 3rd Generation assay with a functional sensitivity of <=0.01 uIU/mL. Performed at [facility], [place] [address] [place], [place] [address]   I-Stat venous blood gas, [facility], [facility], [facility])     Status: Abnormal   Collection Time: 08/20/22  4:47 PM  Result Value Ref Range   pH, Ven 7.432 (H) 7.25 - 7.43   pCO2, Ven 54.0 44 - 60 mmHg   pO2, Ven 44 32 - 45 mmHg   Bicarbonate 36.0 (H) 20.0 - 28.0 mmol/L   TCO2 38 (H) 22 - 32 mmol/L   O2 Saturation 79 %   Acid-Base Excess 10.0 (H) 0.0 - 2.0 mmol/L   Sodium 138 135 - 145 mmol/L   Potassium 3.6 3.5 - 5.1 mmol/L   Calcium, Ion 0.98 (L) 1.15 - 1.40 mmol/L   HCT 32.0 (L) 39.0 - 52.0 %   Hemoglobin 10.9 (L) 13.0 - 17.0 g/dL   Sample type VENOUS   CBG monitoring, ED     Status: None   Collection Time: 08/20/22  4:53 PM  Result Value Ref Range   Glucose-Capillary 77 70 - 99 mg/dL    Comment: Glucose reference range applies only to samples taken after fasting for at least 8 hours.   CT Head Wo Contrast  Result Date: 08/20/2022 CLINICAL DATA:  Mental status change EXAM: CT HEAD WITHOUT CONTRAST TECHNIQUE: Contiguous axial images were obtained from the base of the skull through the vertex without intravenous contrast. RADIATION DOSE REDUCTION: This exam was performed according to the departmental dose-optimization program which includes automated exposure control, adjustment of the mA and/or kV according to patient size and/or use of iterative reconstruction technique. COMPARISON:  CT head 07/21/2022.  MRI brain 07/08/2022. FINDINGS: Brain: No evidence of acute infarction, hemorrhage, hydrocephalus, extra-axial collection or mass lesion/mass effect. There is stable mild periventricular white matter hypodensity, likely chronic small vessel ischemic change. Vascular: Atherosclerotic calcifications are present within the cavernous internal carotid arteries. Skull: Normal. Negative for fracture or focal  lesion. Sinuses/Orbits: No acute finding. Other: None. IMPRESSION: No acute intracranial abnormality. Electronically Signed   By: Ronney Asters M.D.   On: 08/20/2022 17:56   DG Chest Portable 1 View  Result Date: 08/20/2022 CLINICAL DATA:  Altered mental status EXAM: PORTABLE CHEST 1 VIEW COMPARISON:  07/30/2022 FINDINGS: Stable cardiomegaly. Aortic atherosclerosis. No focal airspace consolidation, pleural effusion, or pneumothorax. IMPRESSION: Cardiomegaly without acute cardiopulmonary disease. Electronically Signed   By: Davina Poke D.O.   On: 08/20/2022 16:48    Pending Labs Unresulted Labs (From admission, onward)     Start     Ordered   08/21/22 [id number]  Basic metabolic panel  Daily,   R      08/20/22 1950   08/21/22 0500  Magnesium  Tomorrow morning,   R        08/20/22 1950   08/21/22 0500  CBC  Daily,   R      08/20/22 1950   08/20/22 1950  RPR  Once,   R        08/20/22 1950   08/20/22 1950  Ammonia  Once,   R        08/20/22 1950   08/20/22 1950  Vitamin B12  Once,   R        08/20/22 1950            Vitals/Pain Today's Vitals   08/20/22 1733 08/20/22  1800 08/20/22 1930 08/20/22 2000  BP: (!) 177/100 (!) 149/89 (!) 150/100 116/79  Pulse: 62 63 (!) 53 62  Resp: 18 20 13 $ (!) 22  Temp:      TempSrc:      SpO2: 98% 99% 100% 100%  Weight:      Height:      PainSc:        Isolation Precautions No active isolations  Medications Medications  aspirin chewable tablet 81 mg (has no administration in time range)  atorvastatin (LIPITOR) tablet 40 mg (has no administration in time range)  carvedilol (COREG) tablet 12.5 mg (has no administration in time range)  hydrALAZINE (APRESOLINE) tablet 10 mg (has no administration in time range)  isosorbide mononitrate (IMDUR) 24 hr tablet 15 mg (has no administration in time range)  pantoprazole (PROTONIX) EC tablet 40 mg (has no administration in time range)  sevelamer carbonate (RENVELA) tablet 4,000 mg (has no  administration in time range)  fluticasone furoate-vilanterol (BREO ELLIPTA) 200-25 MCG/ACT 1 puff (has no administration in time range)  heparin injection 5,000 Units (has no administration in time range)  sodium chloride flush (NS) 0.9 % injection 3 mL (has no administration in time range)  acetaminophen (TYLENOL) tablet 650 mg (has no administration in time range)    Or  acetaminophen (TYLENOL) suppository 650 mg (has no administration in time range)  labetalol (NORMODYNE) injection 10 mg (has no administration in time range)  albuterol (PROVENTIL) (2.5 MG/3ML) 0.083% nebulizer solution 2.5 mg (has no administration in time range)  potassium chloride 10 mEq in 100 mL IVPB (has no administration in time range)    Mobility walks with device     Focused Assessments Neuro Assessment Handoff:  Swallow screen pass? Yes          Neuro Assessment: Exceptions to WDL Neuro Checks:      Has TPA been given? No If patient is a Neuro Trauma and patient is going to OR before floor call report to [place] nurse: [phone] or [phone]   R Recommendations: See Admitting Provider Note  Report given to:   Additional Notes: Patient is sleepy but will wake up and respond appropriately. Fistula noted to L arm

## 2022-08-20 NOTE — ED Triage Notes (Signed)
Pt BIB [facility] from dialysis center as he became less responsive towards end of treatment.  Pt normally AOX4 and able to walk on his own.  Facility reports he stated he hadn't eaten in a couple days.  Pt does live on his own.  VS BP 116/59, HR 70, CBG 115

## 2022-08-20 NOTE — ED Notes (Signed)
Pt fistula is on left forearm and bleeding has been controlled with pressure dressing.

## 2022-08-20 NOTE — H&P (Signed)
History and Physical    BOHDEN MAUPIN [id number] DOB: 1956-04-28 DOA: 08/20/2022  PCP: [name], FNP   Patient coming from: Home   Chief Complaint: AMS    HPI: Raymond [name] is a 67 y.o. male with medical history significant for hypertension, polysubstance abuse, hepatitis C treated in 2014, ESRD on hemodialysis, paroxysmal atrial fibrillation not anticoagulated due to severe GI bleeding, and chronic pancytopenia who presents to the emergency department with altered mental status.  Patient has reportedly not been eating for the past couple days, was undergoing dialysis today, and was noted to become less responsive towards the end of treatment.    He was admitted 1 month ago for altered mental status, was found to have acute on chronic anemia, was transfused 1 unit RBC, and mental status returned to normal.  ED Course: Upon arrival to the ED, patient is found to be afebrile and saturating well on room air with normal heart rate and stable blood pressure.  EKG demonstrates atrial flutter with prolonged QT interval.  Chest x-ray is negative for acute findings.  No acute intracranial abnormality noted on head CT.  Labs are most notable for normal TSH, undetectable ethanol, normal pCO2, WBC 2400, hemoglobin 9.4, and platelets 142,000.  Review of Systems:  ROS limited by patient's clinical condition.  Past Medical History:  Diagnosis Date   Anemia    CAD (coronary artery disease)    a. 03/2018: cath showing 65 to 30% stenosis along the LAD, luminal irregularities along the RCA, and angiographically normal LCx   COPD (chronic obstructive pulmonary disease) (HCC)    ESRD (end stage renal disease) on dialysis [facility])    "MWF; [facility]" (07/22/17)   Hepatitis C    Still positive s/p liver biopsy at [facility]  and interferon therapy for 6 months. Most recent lab work was on 10/24/12   Hepatitis C    "took the tx; gone now" (12/05/2016)  Was treated   History of blood transfusion ~  2012/2013   "related to my kidneys; blood was low"   Hypertension    Peripheral arterial disease ([place])    Substance abuse ([place])    Thyroid disease    Type 2 diabetes mellitus with other diabetic kidney complication ([place]) [id number]    Past Surgical History:  Procedure Laterality Date   ABDOMINAL AORTOGRAM W/LOWER EXTREMITY N/A 02/08/2018   Procedure: ABDOMINAL AORTOGRAM W/LOWER EXTREMITY;  Surgeon: Conrad Tarrant, MD;  Location: [place] CV LAB;  Service: Cardiovascular;  Laterality: N/A;   ABDOMINAL AORTOGRAM W/LOWER EXTREMITY Bilateral 06/15/2020   Procedure: ABDOMINAL AORTOGRAM W/LOWER EXTREMITY;  Surgeon: Waynetta Sandy, MD;  Location: [place] CV LAB;  Service: Cardiovascular;  Laterality: Bilateral;   AV FISTULA PLACEMENT Left Aug. 2013 ?   BIOPSY  01/17/2020   Procedure: BIOPSY;  Surgeon: Jackquline Denmark, MD;  Location: [place];  Service: Endoscopy;;   COLONOSCOPY WITH PROPOFOL N/A 08/21/2018   Procedure: COLONOSCOPY WITH PROPOFOL;  Surgeon: Yetta Flock, MD;  Location: [place];  Service: Gastroenterology;  Laterality: N/A;   ENTEROSCOPY N/A 01/17/2020   Procedure: ENTEROSCOPY;  Surgeon: Jackquline Denmark, MD;  Location: [facility] ENDOSCOPY;  Service: Endoscopy;  Laterality: N/A;   ENTEROSCOPY N/A 07/31/2020   Procedure: ENTEROSCOPY;  Surgeon: Doran Stabler, MD;  Location: [place];  Service: Gastroenterology;  Laterality: N/A;   ENTEROSCOPY N/A 01/27/2021   Procedure: ENTEROSCOPY;  Surgeon: Yetta Flock, MD;  Location: [facility] ENDOSCOPY;  Service: Gastroenterology;  Laterality: N/A;  ENTEROSCOPY N/A 07/22/2022   Procedure: ENTEROSCOPY;  Surgeon: Doran Stabler, MD;  Location: [facility] ENDOSCOPY;  Service: Gastroenterology;  Laterality: N/A;   ESOPHAGOGASTRODUODENOSCOPY (EGD) WITH PROPOFOL N/A 08/20/2018   Procedure: ESOPHAGOGASTRODUODENOSCOPY (EGD) WITH PROPOFOL;  Surgeon: Gatha Mayer, MD;  Location: [place];  Service: Endoscopy;  Laterality: N/A;    FEMORAL-POPLITEAL BYPASS GRAFT Left 04/11/2018   Procedure: BYPASS GRAFT FEMORAL-POPLITEAL ARTERY LEFT LEG;  Surgeon: Rosetta Posner, MD;  Location: [facility] OR;  Service: Vascular;  Laterality: Left;   GIVENS CAPSULE STUDY N/A 07/31/2020   Procedure: GIVENS CAPSULE STUDY;  Surgeon: Doran Stabler, MD;  Location: [place];  Service: Gastroenterology;  Laterality: N/A;   HOT HEMOSTASIS N/A 08/20/2018   Procedure: HOT HEMOSTASIS (ARGON PLASMA COAGULATION/BICAP);  Surgeon: Gatha Mayer, MD;  Location: [facility] ENDOSCOPY;  Service: Endoscopy;  Laterality: N/A;   HOT HEMOSTASIS N/A 01/17/2020   Procedure: HOT HEMOSTASIS (ARGON PLASMA COAGULATION/BICAP);  Surgeon: Jackquline Denmark, MD;  Location: [facility] ENDOSCOPY;  Service: Endoscopy;  Laterality: N/A;   HOT HEMOSTASIS N/A 01/27/2021   Procedure: HOT HEMOSTASIS (ARGON PLASMA COAGULATION/BICAP);  Surgeon: Yetta Flock, MD;  Location: [facility] ENDOSCOPY;  Service: Gastroenterology;  Laterality: N/A;   HOT HEMOSTASIS N/A 07/22/2022   Procedure: HOT HEMOSTASIS (ARGON PLASMA COAGULATION/BICAP);  Surgeon: Doran Stabler, MD;  Location: [place];  Service: Gastroenterology;  Laterality: N/A;   LEFT HEART CATH AND CORONARY ANGIOGRAPHY N/A 03/29/2018   Procedure: LEFT HEART CATH AND CORONARY ANGIOGRAPHY;  Surgeon: Nelva Bush, MD;  Location: [place] CV LAB;  Service: Cardiovascular;  Laterality: N/A;   LIVER BIOPSY     ORIF ULNAR FRACTURE Left 05/18/2017   Procedure: OPEN REDUCTION INTERNAL FIXATION (ORIF) ULNAR FRACTURE;  Surgeon: Iran Planas, MD;  Location: [place];  Service: Orthopedics;  Laterality: Left;   PERIPHERAL VASCULAR INTERVENTION Right 06/15/2020   Procedure: PERIPHERAL VASCULAR INTERVENTION;  Surgeon: Waynetta Sandy, MD;  Location: [place] CV LAB;  Service: Cardiovascular;  Laterality: Right;   POLYPECTOMY  08/21/2018   Procedure: POLYPECTOMY;  Surgeon: Yetta Flock, MD;  Location: [place];  Service: Gastroenterology;;    Earney [place] N/A 12/11/2012   Procedure: Earney [place];  Surgeon: Serafina Mitchell, MD;  Location: [facility] CATH LAB;  Service: Cardiovascular;  Laterality: N/A;   WOUND EXPLORATION Left 06/15/2020   Procedure: GROIN  EXPLORATION;  Surgeon: Waynetta Sandy, MD;  Location: [place];  Service: Vascular;  Laterality: Left;    Social History:   reports that he has been smoking cigarettes. He has a 25.00 pack-year smoking history. He has never used smokeless tobacco. He reports current alcohol use of about 10.0 standard drinks of alcohol per week. He reports that he does not currently use drugs after having used the following drugs: Cocaine.  No Known Allergies  Family History  Problem Relation Age of Onset   Diabetes Father    Hypertension Father    Heart disease Father      Prior to Admission medications   Medication Sig Start Date End Date Taking? Authorizing Provider  albuterol (VENTOLIN HFA) 108 (90 Base) MCG/ACT inhaler Inhale 3 puffs into the lungs daily as needed for shortness of breath or wheezing. 11/06/19   [provider]  aspirin 81 MG chewable tablet Chew 1 tablet (81 mg total) by mouth daily. 07/26/22   Charlynne Cousins, MD  atorvastatin (LIPITOR) 40 MG tablet Take 1 tablet (40 mg total) by mouth daily. 07/09/22 10/07/22  Mariel Aloe, MD  BREO ELLIPTA 200-25 MCG/ACT AEPB Inhale 2 puffs into the lungs daily as needed (for shortness of breath). 09/17/21   [provider]  calcitRIOL (ROCALTROL) 0.5 MCG capsule Take 1 capsule (0.5 mcg total) by mouth Every Tuesday,Thursday,and Saturday with dialysis. 12/02/21   Mariel Aloe, MD  carvedilol (COREG) 12.5 MG tablet Take 2 tablets (25 mg total) by mouth every morning AND 1 tablet (12.5 mg total) every evening. 12/29/21   Deberah Pelton, NP  cinacalcet (SENSIPAR) 30 MG tablet Take 6 tablets (180 mg total) by mouth Every Tuesday,Thursday,and Saturday with dialysis. 12/12/20   Mercy Riding, MD  Darbepoetin Alfa  (ARANESP) 200 MCG/0.4ML SOSY injection Inject 0.4 mLs (200 mcg total) into the vein every Tuesday with hemodialysis. 05/25/21   France Ravens, MD  famotidine (PEPCID) 10 MG tablet Take 1 tablet (10 mg total) by mouth daily for 14 days. 07/30/22 08/13/22  Regan Lemming, MD  hydrALAZINE (APRESOLINE) 10 MG tablet Take 1 tablet (10 mg total) by mouth every 8 (eight) hours. 07/08/22 10/06/22  Mariel Aloe, MD  isosorbide mononitrate (IMDUR) 30 MG 24 hr tablet Take 1/2 tablet (15 mg total) by mouth daily. 07/09/22 10/07/22  Mariel Aloe, MD  loperamide (IMODIUM) 2 MG capsule Take 2 mg by mouth as needed for diarrhea or loose stools. 07/18/22   [provider]  metroNIDAZOLE (FLAGYL) 500 MG tablet Take 500 mg by mouth 3 (three) times daily. 07/18/22   [provider]  multivitamin (RENA-VIT) TABS tablet Take 1 tablet by mouth at bedtime. 09/17/21   Sheikh, Omair Latif, DO  pantoprazole (PROTONIX) 40 MG tablet Take 1 tablet (40 mg total) by mouth daily. 07/30/22   Regan Lemming, MD  sertraline (ZOLOFT) 25 MG tablet Take 25 mg by mouth daily. 12/07/21   [provider]  sevelamer carbonate (RENVELA) 800 MG tablet Take 5 tablets (4,000 mg total) by mouth 3 (three) times daily with meals. 09/17/21   Raiford Noble Latif, DO  apixaban (ELIQUIS) 5 MG TABS tablet Take 1 tablet (5 mg total) by mouth 2 (two) times daily. 06/22/20 08/03/20  Karoline Caldwell, PA-C    Physical Exam: Vitals:   08/20/22 1600 08/20/22 1733 08/20/22 1800 08/20/22 1930  BP: 132/78 (!) 177/100 (!) 149/89 (!) 150/100  Pulse: (!) 58 62 63 (!) 53  Resp: 17 18 20 13  $ Temp:      TempSrc:      SpO2: 97% 98% 99% 100%  Weight:      Height:         Constitutional: NAD, no pallor or diaphoresis  Eyes: PERTLA, lids and conjunctivae normal ENMT: Mucous membranes are moist. Posterior pharynx clear of any exudate or lesions.   Neck: supple, no masses  Respiratory: no wheezing, no crackles. No accessory muscle use.   Cardiovascular: S1 & S2 heard, regular rate and rhythm. No JVD. Abdomen: No distension, no tenderness, soft. Bowel sounds active.  Musculoskeletal: no clubbing / cyanosis. No joint deformity upper and lower extremities.   Skin: no significant rashes, lesions, ulcers. Warm, dry, well-perfused. Neurologic: CN 2-12 grossly intact. Moving all extremities. Somnolent, wakes briefly to loud voice but not answering questions.    Labs and Imaging on Admission: I have personally reviewed following labs and imaging studies  CBC: Recent Labs  Lab 08/20/22 1628 08/20/22 1647  WBC 2.4*  --   NEUTROABS 1.1*  --   HGB 9.4* 10.9*  HCT 29.8* 32.0*  MCV 97.4  --   PLT 142*  --  Basic Metabolic Panel: Recent Labs  Lab 08/20/22 1628 08/20/22 1647  NA 137 138  K 3.5 3.6  CL 93*  --   CO2 32  --   GLUCOSE 87  --   BUN 24*  --   CREATININE 7.12*  --   CALCIUM 8.3*  --   MG 2.1  --   PHOS 3.2  --    GFR: Estimated Creatinine Clearance: 9 mL/min (A) (by C-G formula based on SCr of 7.12 mg/dL (H)). Liver Function Tests: Recent Labs  Lab 08/20/22 1628  AST 11*  ALT 9  ALKPHOS 85  BILITOT 0.3  PROT 7.6  ALBUMIN 3.2*   No results for input(s): "LIPASE", "AMYLASE" in the last 168 hours. No results for input(s): "AMMONIA" in the last 168 hours. Coagulation Profile: No results for input(s): "INR", "PROTIME" in the last 168 hours. Cardiac Enzymes: No results for input(s): "CKTOTAL", "CKMB", "CKMBINDEX", "TROPONINI" in the last 168 hours. BNP (last 3 results) No results for input(s): "PROBNP" in the last 8760 hours. HbA1C: No results for input(s): "HGBA1C" in the last 72 hours. CBG: Recent Labs  Lab 08/20/22 1653  GLUCAP 77   Lipid Profile: No results for input(s): "CHOL", "HDL", "LDLCALC", "TRIG", "CHOLHDL", "LDLDIRECT" in the last 72 hours. Thyroid Function Tests: Recent Labs    08/20/22 1628 08/20/22 1629  TSH  --  3.018  FREET4 0.85  --    Anemia Panel: No results  for input(s): "VITAMINB12", "FOLATE", "FERRITIN", "TIBC", "IRON", "RETICCTPCT" in the last 72 hours. Urine analysis:    Component Value Date/Time   COLORURINE YELLOW 05/08/2010 [place] 05/08/2010 1335   LABSPEC 1.009 05/08/2010 1335   PHURINE 6.0 05/08/2010 1335   GLUCOSEU NEGATIVE 05/08/2010 1335   HGBUR NEGATIVE 05/08/2010 1335   BILIRUBINUR NEGATIVE 05/08/2010 1335   KETONESUR NEGATIVE 05/08/2010 1335   PROTEINUR 100 (A) 05/08/2010 1335   UROBILINOGEN 0.2 05/08/2010 1335   NITRITE NEGATIVE 05/08/2010 1335   LEUKOCYTESUR SMALL (A) 05/08/2010 1335   Sepsis Labs: @LABRCNTIP$ (procalcitonin:4,lacticidven:4) )No results found for this or any previous visit (from the past 240 hour(s)).   Radiological Exams on Admission: CT Head Wo Contrast  Result Date: 08/20/2022 CLINICAL DATA:  Mental status change EXAM: CT HEAD WITHOUT CONTRAST TECHNIQUE: Contiguous axial images were obtained from the base of the skull through the vertex without intravenous contrast. RADIATION DOSE REDUCTION: This exam was performed according to the departmental dose-optimization program which includes automated exposure control, adjustment of the mA and/or kV according to patient size and/or use of iterative reconstruction technique. COMPARISON:  CT head 07/21/2022.  MRI brain 07/08/2022. FINDINGS: Brain: No evidence of acute infarction, hemorrhage, hydrocephalus, extra-axial collection or mass lesion/mass effect. There is stable mild periventricular white matter hypodensity, likely chronic small vessel ischemic change. Vascular: Atherosclerotic calcifications are present within the cavernous internal carotid arteries. Skull: Normal. Negative for fracture or focal lesion. Sinuses/Orbits: No acute finding. Other: None. IMPRESSION: No acute intracranial abnormality. Electronically Signed   By: Ronney Asters M.D.   On: 08/20/2022 17:56   DG Chest Portable 1 View  Result Date: 08/20/2022 CLINICAL DATA:   Altered mental status EXAM: PORTABLE CHEST 1 VIEW COMPARISON:  07/30/2022 FINDINGS: Stable cardiomegaly. Aortic atherosclerosis. No focal airspace consolidation, pleural effusion, or pneumothorax. IMPRESSION: Cardiomegaly without acute cardiopulmonary disease. Electronically Signed   By: Davina Poke D.O.   On: 08/20/2022 16:48    EKG: Independently reviewed. Atrial flutter, QTc 613.   Assessment/Plan  1. Acute encephalopathy  -  Patient became obtunded towards the end of his dialysis session and was sent to ED where there is no acute findings on head CT, TSH and free T4 are normal, EtOH is undetectable, pCO2 is normal, and there are no significant electrolyte disturbances or evidence for infection  - Check ammonia, B12, and RPR, use delirium precautions, supportive care    2. ESRD  - No indication for urgent HD on admission  - Restrict fluids, renally-dose medications, continue phos binder    3. Hypertension  - Continue Coreg, hydralazine  4. CAD - Continue ASA, Lipitor, Coreg, and Imdur   5. PAF  - Not anticoagulated d/t hx of severe GI bleeding  - Continue ASA and Coreg    6. Pancytopenia  - Appears to be chronic and stable  - Outpatient follow-up with heme-onc recommended    7. COPD  - No cough or wheezing on admission  - Continue Breo and as-needed albuterol   8. Prolonged QT interval  - QTc 613 ms in ED   - Mag level is 2.1, potassium 3.5  - Supplement potassium, continue cardiac monitoring, avoid QT-prolonging medications    DVT prophylaxis: sq heparin  Code Status: Full  Level of Care: Level of care: Telemetry Medical Family Communication: None present  Disposition Plan:  Patient is from: Home  Anticipated d/c is to: TBD Anticipated d/c date is: 2/11 or 08/22/22  Patient currently: Pending improved mental status Consults called: none  Admission status: Observation     Vianne Bulls, MD [facility]  08/20/2022, 7:50 PM

## 2022-08-20 NOTE — ED Notes (Signed)
Patient transported to CT 

## 2022-08-20 NOTE — ED Provider Notes (Signed)
[facility] Provider Note   CSN: [id number] Arrival date & time: 08/20/22  1536     History {Add pertinent medical, surgical, social history, OB history to HPI:1} Chief Complaint  Patient presents with   Altered Mental Status    Raymond [name] is a 67 y.o. male.  67 y.o. male with medical history significant for end-stage renal disease on hemodialysis Tuesday Thursday Saturday, paroxysmal A-fib/A-flutter not on oral anticoagulation, essential hypertension, chronic systolic CHF, peripheral artery disease on aspirin 81 mg daily, history of AVMs, COPD, secondary hyperparathyroidism, recently admitted on 07/03/2022 and discharged on 07/08/2022 treated for C. difficile infection and electrolytes derangement, who presented to [facility] ED sent from hemodialysis center due to altered mental status.  History is limited by patient's mental status.  Per triage note patient became more somnolent at the end of dialysis session today he is normally alert and oriented x 4 and able to ambulate there is a history of poor p.o. intake over the past several days.   Altered Mental Status      Home Medications Prior to Admission medications   Medication Sig Start Date End Date Taking? Authorizing Provider  albuterol (VENTOLIN HFA) 108 (90 Base) MCG/ACT inhaler Inhale 3 puffs into the lungs daily as needed for shortness of breath or wheezing. 11/06/19   [provider]  aspirin 81 MG chewable tablet Chew 1 tablet (81 mg total) by mouth daily. 07/26/22   Charlynne Cousins, MD  atorvastatin (LIPITOR) 40 MG tablet Take 1 tablet (40 mg total) by mouth daily. 07/09/22 10/07/22  Mariel Aloe, MD  BREO ELLIPTA 200-25 MCG/ACT AEPB Inhale 2 puffs into the lungs daily as needed (for shortness of breath). 09/17/21   [provider]  calcitRIOL (ROCALTROL) 0.5 MCG capsule Take 1 capsule (0.5 mcg total) by mouth Every Tuesday,Thursday,and Saturday with  dialysis. 12/02/21   Mariel Aloe, MD  carvedilol (COREG) 12.5 MG tablet Take 2 tablets (25 mg total) by mouth every morning AND 1 tablet (12.5 mg total) every evening. 12/29/21   Deberah Pelton, NP  cinacalcet (SENSIPAR) 30 MG tablet Take 6 tablets (180 mg total) by mouth Every Tuesday,Thursday,and Saturday with dialysis. 12/12/20   Mercy Riding, MD  Darbepoetin Alfa (ARANESP) 200 MCG/0.4ML SOSY injection Inject 0.4 mLs (200 mcg total) into the vein every Tuesday with hemodialysis. 05/25/21   France Ravens, MD  famotidine (PEPCID) 10 MG tablet Take 1 tablet (10 mg total) by mouth daily for 14 days. 07/30/22 08/13/22  Regan Lemming, MD  hydrALAZINE (APRESOLINE) 10 MG tablet Take 1 tablet (10 mg total) by mouth every 8 (eight) hours. 07/08/22 10/06/22  Mariel Aloe, MD  isosorbide mononitrate (IMDUR) 30 MG 24 hr tablet Take 1/2 tablet (15 mg total) by mouth daily. 07/09/22 10/07/22  Mariel Aloe, MD  loperamide (IMODIUM) 2 MG capsule Take 2 mg by mouth as needed for diarrhea or loose stools. 07/18/22   [provider]  metroNIDAZOLE (FLAGYL) 500 MG tablet Take 500 mg by mouth 3 (three) times daily. 07/18/22   [provider]  multivitamin (RENA-VIT) TABS tablet Take 1 tablet by mouth at bedtime. 09/17/21   Sheikh, Omair Latif, DO  pantoprazole (PROTONIX) 40 MG tablet Take 1 tablet (40 mg total) by mouth daily. 07/30/22   Regan Lemming, MD  sertraline (ZOLOFT) 25 MG tablet Take 25 mg by mouth daily. 12/07/21   [provider]  sevelamer carbonate (RENVELA) 800 MG tablet Take  5 tablets (4,000 mg total) by mouth 3 (three) times daily with meals. 09/17/21   Raiford Noble Latif, DO  apixaban (ELIQUIS) 5 MG TABS tablet Take 1 tablet (5 mg total) by mouth 2 (two) times daily. 06/22/20 08/03/20  Karoline Caldwell, PA-C      Allergies    Patient has no known allergies.    Review of Systems   Review of Systems  Physical Exam Updated Vital Signs BP 132/78   Pulse (!) 58   Temp 97.8 F  (36.6 C) (Oral)   Resp 17   Ht 5' 6"$  (1.676 m)   Wt 72.6 kg   SpO2 97%   BMI 25.82 kg/m  Physical Exam Vitals and nursing note reviewed.  Constitutional:      General: He is sleeping. He is not in acute distress.    Appearance: He is well-developed.  HENT:     Head: Normocephalic and atraumatic.     Mouth/Throat:     Mouth: Mucous membranes are moist.  Eyes:     Conjunctiva/sclera: Conjunctivae normal.     Pupils: Pupils are equal, round, and reactive to light.     Comments: Roving EOM  Cardiovascular:     Rate and Rhythm: Regular rhythm. Bradycardia present.     Heart sounds: No murmur heard. Pulmonary:     Effort: Pulmonary effort is normal. No respiratory distress.     Breath sounds: Rhonchi present.  Abdominal:     Palpations: Abdomen is soft.     Tenderness: There is no abdominal tenderness.  Musculoskeletal:        General: No swelling.     Cervical back: Neck supple.  Skin:    General: Skin is warm and dry.     Capillary Refill: Capillary refill takes less than 2 seconds.  Neurological:     Mental Status: He is lethargic.     GCS: GCS eye subscore is 3. GCS verbal subscore is 2. GCS motor subscore is 6.  Psychiatric:        Mood and Affect: Mood normal.     ED Results / Procedures / Treatments   Labs (all labs ordered are listed, but only abnormal results are displayed) Labs Reviewed - No data to display  EKG None  Radiology No results found.  Procedures Procedures  {Document cardiac monitor, telemetry assessment procedure when appropriate:1}  Medications Ordered in ED Medications - No data to display  ED Course/ Medical Decision Making/ A&P   {   Click here for ABCD2, HEART and other calculatorsREFRESH Note before signing :1}                          Medical Decision Making Amount and/or Complexity of Data Reviewed Labs: ordered. Radiology: ordered. ECG/medicine tests: ordered.   ***  {Document critical care time when  appropriate:1} {Document review of labs and clinical decision tools ie heart score, Chads2Vasc2 etc:1}  {Document your independent review of radiology images, and any outside records:1} {Document your discussion with family members, caretakers, and with consultants:1} {Document social determinants of health affecting pt's care:1} {Document your decision making why or why not admission, treatments were needed:1} Final Clinical Impression(s) / ED Diagnoses Final diagnoses:  None    Rx / DC Orders ED Discharge Orders     None

## 2022-08-21 DIAGNOSIS — G9340 Encephalopathy, unspecified: Secondary | ICD-10-CM | POA: Diagnosis not present

## 2022-08-21 LAB — BASIC METABOLIC PANEL
Anion gap: 12 (ref 5–15)
BUN: 32 mg/dL — ABNORMAL HIGH (ref 8–23)
CO2: 28 mmol/L (ref 22–32)
Calcium: 9 mg/dL (ref 8.9–10.3)
Chloride: 96 mmol/L — ABNORMAL LOW (ref 98–111)
Creatinine, Ser: 9.03 mg/dL — ABNORMAL HIGH (ref 0.61–1.24)
GFR, Estimated: 6 mL/min — ABNORMAL LOW (ref >60)
Glucose, Bld: 68 mg/dL — ABNORMAL LOW (ref 70–99)
Potassium: 5 mmol/L (ref 3.5–5.1)
Sodium: 136 mmol/L (ref 135–145)

## 2022-08-21 LAB — CBC
HCT: 29.4 % — ABNORMAL LOW (ref 39.0–52.0)
Hemoglobin: 9.4 g/dL — ABNORMAL LOW (ref 13.0–17.0)
MCH: 31 pg (ref 26.0–34.0)
MCHC: 32 g/dL (ref 30.0–36.0)
MCV: 97 fL (ref 80.0–100.0)
Platelets: 155 10*3/uL (ref 150–400)
RBC: 3.03 MIL/uL — ABNORMAL LOW (ref 4.22–5.81)
RDW: 18.5 % — ABNORMAL HIGH (ref 11.5–15.5)
WBC: 3.1 10*3/uL — ABNORMAL LOW (ref 4.0–10.5)
nRBC: 0 % (ref 0.0–0.2)

## 2022-08-21 LAB — RPR: RPR Ser Ql: NONREACTIVE

## 2022-08-21 NOTE — Progress Notes (Signed)
Pt with ataxia to all extremities when standing shakes and tremors Dr. Avon Gully is aware pt reports that it started 3 days before his last dialysis , seen by PT with this RN at bedside and gait was weak and shaky his legs buckled several times, later this evening pt vomited his dinner

## 2022-08-21 NOTE — Plan of Care (Signed)

## 2022-08-21 NOTE — Progress Notes (Signed)
PROGRESS NOTE    Raymond [name]  [id number] DOB: July 09, 1956 DOA: 08/20/2022 PCP: [name], FNP   Brief Narrative:  Raymond [name] is a 67 y.o. male with medical history significant for hypertension, polysubstance abuse, hepatitis C treated in 2014, ESRD on hemodialysis, paroxysmal atrial fibrillation not anticoagulated due to severe GI bleeding, and chronic pancytopenia who presents to the emergency department with altered mental status.   Patient has reportedly not been eating for the past couple days, was undergoing dialysis today, and was noted to become less responsive towards the end of treatment. Admitted to hospital team with nephrology consult(given HD).  Assessment & Plan:   Principal Problem:   Acute encephalopathy Active Problems:   Paroxysmal atrial fibrillation (HCC)   COPD (chronic obstructive pulmonary disease) (HCC)   Polysubstance abuse (HCC)   ESRD on hemodialysis (HCC)   Pancytopenia (HCC)   Uncontrolled hypertension   Prolonged QT interval  Acute encephalopathy, improving but not resolved Concern for hypovolemia/hypotension - rule out orthostatic hypotension Ambulatory dysfunction - Patient became obtunded towards the end of his dialysis session and was sent to ED where there is no acute findings on head CT, TSH and free T4 are normal, EtOH is undetectable, pCO2 is normal, and there are no significant electrolyte disturbances or evidence for infection  - Ammonia, B12 wnl/elevated respectively -PT/OT to follow given ongoing instability walking   ESRD TTS - No indication for urgent HD on admission - follow along with nephrology - Restrict fluids, renally-dose medications, continue phos binder     Hypertension  - Continue Coreg, hydralazine   CAD - Continue ASA, Lipitor, Coreg, and Imdur    PAF  - Not anticoagulated d/t hx of severe GI bleeding  - Continue ASA and Coreg     Pancytopenia  - Appears to be chronic and stable  - Consider  outpatient heme-onc follow up if ongoing    COPD  - No cough or wheezing on admission  - Continue Breo and as-needed albuterol    Prolonged QT interval  - QTc 613 ms in ED   - Mag level is 2.1, potassium 3.5  - Supplement potassium, continue cardiac monitoring, avoid QT-prolonging medications   DVT prophylaxis: Heparin Code Status: Full Family Communication: None present  Status is: Inpt  Dispo: The patient is from: Home              Anticipated d/c is to: Home              Anticipated d/c date is: 24-48h              Patient currently NOT medically stable for discharge  Consultants:  Nephrology  Procedures:  None  Antimicrobials:  None  Subjective: No acute issues or events overnight, mental status improving but not yet back to baseline.  Remains somewhat somnolent, reported to be tremulous and somewhat unstable with ambulation  Objective: Vitals:   08/20/22 2342 08/21/22 0421 08/21/22 0628 08/21/22 0848  BP: (!) 143/87 (!) 130/102 (!) 153/86 129/78  Pulse: 66 (!) 105  92  Resp: 16 16  16  $ Temp: 97.8 F (36.6 C) 98.4 F (36.9 C)  98.2 F (36.8 C)  TempSrc: Oral Oral  Oral  SpO2: 96% 93%  95%  Weight:      Height:        Intake/Output Summary (Last 24 hours) at 08/21/2022 1118 Last data filed at 08/21/2022 0615 Gross per 24 hour  Intake 63 ml  Output  0 ml  Net 63 ml   Filed Weights   08/20/22 1546  Weight: 72.6 kg    Examination:  General:  Pleasantly resting in bed, No acute distress. HEENT:  Normocephalic atraumatic.  Sclerae nonicteric, noninjected.  Extraocular movements intact bilaterally. Neck:  Without mass or deformity.  Trachea is midline. Lungs:  Clear to auscultate bilaterally without rhonchi, wheeze, or rales. Heart:  Regular rate and rhythm.  Without murmurs, rubs, or gallops. Abdomen:  Soft, nontender, nondistended.  Without guarding or rebound.  Data Reviewed: I have personally reviewed following labs and imaging  studies  CBC: Recent Labs  Lab 08/20/22 1628 08/20/22 1647  WBC 2.4*  --   NEUTROABS 1.1*  --   HGB 9.4* 10.9*  HCT 29.8* 32.0*  MCV 97.4  --   PLT 142*  --    Basic Metabolic Panel: Recent Labs  Lab 08/20/22 1628 08/20/22 1647  NA 137 138  K 3.5 3.6  CL 93*  --   CO2 32  --   GLUCOSE 87  --   BUN 24*  --   CREATININE 7.12*  --   CALCIUM 8.3*  --   MG 2.1  --   PHOS 3.2  --    GFR: Estimated Creatinine Clearance: 9 mL/min (A) (by C-G formula based on SCr of 7.12 mg/dL (H)). Liver Function Tests: Recent Labs  Lab 08/20/22 1628  AST 11*  ALT 9  ALKPHOS 85  BILITOT 0.3  PROT 7.6  ALBUMIN 3.2*   No results for input(s): "LIPASE", "AMYLASE" in the last 168 hours. Recent Labs  Lab 08/20/22 2040  AMMONIA 16   Coagulation Profile: No results for input(s): "INR", "PROTIME" in the last 168 hours. Cardiac Enzymes: No results for input(s): "CKTOTAL", "CKMB", "CKMBINDEX", "TROPONINI" in the last 168 hours. BNP (last 3 results) No results for input(s): "PROBNP" in the last 8760 hours. HbA1C: No results for input(s): "HGBA1C" in the last 72 hours. CBG: Recent Labs  Lab 08/20/22 1653  GLUCAP 77   Lipid Profile: No results for input(s): "CHOL", "HDL", "LDLCALC", "TRIG", "CHOLHDL", "LDLDIRECT" in the last 72 hours. Thyroid Function Tests: Recent Labs    08/20/22 1628 08/20/22 1629  TSH  --  3.018  FREET4 0.85  --    Anemia Panel: Recent Labs    08/20/22 2040  VITAMINB12 1,982*   Sepsis Labs: No results for input(s): "PROCALCITON", "LATICACIDVEN" in the last 168 hours.  No results found for this or any previous visit (from the past 240 hour(s)).       Radiology Studies: CT Head Wo Contrast  Result Date: 08/20/2022 CLINICAL DATA:  Mental status change EXAM: CT HEAD WITHOUT CONTRAST TECHNIQUE: Contiguous axial images were obtained from the base of the skull through the vertex without intravenous contrast. RADIATION DOSE REDUCTION: This exam was  performed according to the departmental dose-optimization program which includes automated exposure control, adjustment of the mA and/or kV according to patient size and/or use of iterative reconstruction technique. COMPARISON:  CT head 07/21/2022.  MRI brain 07/08/2022. FINDINGS: Brain: No evidence of acute infarction, hemorrhage, hydrocephalus, extra-axial collection or mass lesion/mass effect. There is stable mild periventricular white matter hypodensity, likely chronic small vessel ischemic change. Vascular: Atherosclerotic calcifications are present within the cavernous internal carotid arteries. Skull: Normal. Negative for fracture or focal lesion. Sinuses/Orbits: No acute finding. Other: None. IMPRESSION: No acute intracranial abnormality. Electronically Signed   By: Ronney Asters M.D.   On: 08/20/2022 17:56   DG Chest Portable 1  View  Result Date: 08/20/2022 CLINICAL DATA:  Altered mental status EXAM: PORTABLE CHEST 1 VIEW COMPARISON:  07/30/2022 FINDINGS: Stable cardiomegaly. Aortic atherosclerosis. No focal airspace consolidation, pleural effusion, or pneumothorax. IMPRESSION: Cardiomegaly without acute cardiopulmonary disease. Electronically Signed   By: Davina Poke D.O.   On: 08/20/2022 16:48    Scheduled Meds:  aspirin  81 mg Oral Daily   atorvastatin  40 mg Oral Daily   carvedilol  12.5 mg Oral BID   fluticasone furoate-vilanterol  1 puff Inhalation Daily   heparin  5,000 Units Subcutaneous Q8H   hydrALAZINE  10 mg Oral Q8H   isosorbide mononitrate  15 mg Oral Daily   pantoprazole  40 mg Oral Daily   sevelamer carbonate  4,000 mg Oral TID WC   sodium chloride flush  3 mL Intravenous Q12H   Continuous Infusions:   LOS: 0 days   Time spent: 58mn  Jaimin Krupka C Jovanni Eckhart, DO [facility]  If 7PM-7AM, please contact night-coverage [email or web address]  08/21/2022, 11:18 AM

## 2022-08-21 NOTE — Plan of Care (Signed)
  Problem: Education: Goal: Knowledge of General Education information will improve Description: Including pain rating scale, medication(s)/side effects and non-pharmacologic comfort measures Outcome: Not Progressing   

## 2022-08-21 NOTE — Evaluation (Signed)
Occupational Therapy Evaluation Patient Details Name: Raymond [name] MRN: [id number] DOB: Dec 03, 1955 Today's Date: 08/21/2022   History of Present Illness 67 yo male presented to  to the emergency department with altered mental status. Work up demonstrates Acute encephalopathy, Concern for hypovolemia/hypotension, and ambulatory dysfunction. PMHx: hypertension, polysubstance abuse, hepatitis C treated in 2014, ESRD on hemodialysis, paroxysmal atrial fibrillation not anticoagulated due to severe GI bleeding, and chronic pancytopenia   Clinical Impression   Pt was evaluated s/p the above admission list, he lives at home alone and is mod I with out of SPC vs. RW. Upon evaluation pt was limited by generalized weakness, impaired sensory motor control in all extremities, involuntary movements, bilateral knee buckling, poor balance and limited activity tolerance. Overall he needed max A to safely stand at the EOB, pt with multiple knee buckles that needed max A to prevent a fall. Once standing for a few minutes, pt was able to progress to marching in place and side stepping. Due to the deficits listed below, he also requires mod-max A for ADLs. OT to continue to follow acutely. Recommend d/c to SNF for safety.      Recommendations for follow up therapy are one component of a multi-disciplinary discharge planning process, led by the attending physician.  Recommendations may be updated based on patient status, additional functional criteria and insurance authorization.   Follow Up Recommendations  Skilled nursing-short term rehab (<3 hours/day)     Assistance Recommended at Discharge Frequent or constant Supervision/Assistance  Patient can return home with the following A lot of help with walking and/or transfers;A lot of help with bathing/dressing/bathroom;Assistance with cooking/housework;Direct supervision/assist for medications management;Direct supervision/assist for financial management;Assist for  transportation;Help with stairs or ramp for entrance    Functional Status Assessment  Patient has had a recent decline in their functional status and demonstrates the ability to make significant improvements in function in a reasonable and predictable amount of time.  Equipment Recommendations  None recommended by OT    Recommendations for Other Services       Precautions / Restrictions Precautions Precautions: Fall Precaution Comments: bilateral knee buckleing Restrictions Weight Bearing Restrictions: No      Mobility Bed Mobility Overal bed mobility: Needs Assistance Bed Mobility: Supine to Sit, Sit to Supine     Supine to sit: Supervision Sit to supine: Supervision        Transfers Overall transfer level: Needs assistance Equipment used: Rolling walker (2 wheels) Transfers: Sit to/from Stand Sit to Stand: Max assist           General transfer comment: stood intially with min G, immediately buckled and needed max A to prevent a fall. Once standing, pt's balance and motor control improved. Able to march in place adn side step wiht min G - max A.      Balance Overall balance assessment: Needs assistance Sitting-balance support: Feet supported Sitting balance-Leahy Scale: Good     Standing balance support: Bilateral upper extremity supported, During functional activity Standing balance-Leahy Scale: Poor                             ADL either performed or assessed with clinical judgement   ADL Overall ADL's : Needs assistance/impaired Eating/Feeding: Moderate assistance;Sitting   Grooming: Moderate assistance;Sitting   Upper Body Bathing: Moderate assistance;Sitting   Lower Body Bathing: Maximal assistance;Sit to/from stand   Upper Body Dressing : Moderate assistance;Sitting   Lower Body Dressing: Maximal assistance;Sit  to/from Archivist: Maximal assistance;Rolling walker (2 wheels)   Toileting- Clothing Manipulation and  Hygiene: Maximal assistance;Sit to/from stand       Functional mobility during ADLs: Maximal assistance General ADL Comments: limited by BLE buckling, involuntary motor dysfunction     Vision Baseline Vision/History: 2 Legally blind Ability to See in Adequate Light: 1 Impaired Vision Assessment?: Vision impaired- to be further tested in functional context Additional Comments: low vision at baseline     Perception Perception Perception Tested?: No   Praxis Praxis Praxis tested?: Not tested    Pertinent Vitals/Pain       Hand Dominance Right   Extremity/Trunk Assessment Upper Extremity Assessment Upper Extremity Assessment: RUE deficits/detail;LUE deficits/detail;Generalized weakness RUE Deficits / Details: involuntary shaking, impiared sensory motor corrdination. Seems to improve wtih functional tasks RUE Coordination: decreased fine motor;decreased gross motor LUE Deficits / Details: involuntary shaking, impiared sensory motor corrdination. Seems to improve wtih functional tasks LUE Coordination: decreased fine motor;decreased gross motor   Lower Extremity Assessment Lower Extremity Assessment: Defer to PT evaluation   Cervical / Trunk Assessment Cervical / Trunk Assessment: Kyphotic   Communication Communication Communication: HOH   Cognition Arousal/Alertness: Awake/alert Behavior During Therapy: Flat affect, Impulsive Overall Cognitive Status: No family/caregiver present to determine baseline cognitive functioning                                 General Comments: following all commands, impulsive but easy to re-direct.     General Comments  VSS on RA    Exercises     Shoulder Instructions      Home Living Family/patient expects to be discharged to:: Private residence Living Arrangements: Alone Available Help at Discharge: Family;Friend(s) Type of Home: Apartment Home Access: Stairs to enter Entrance Stairs-Number of Steps: 4 Entrance  Stairs-Rails: Right;Left Home Layout: One level     Bathroom Shower/Tub: Teacher, early years/pre: Standard Bathroom Accessibility: Yes   Home Equipment: Cane - single point;Shower Land (2 wheels)   Additional Comments: pt is normally home with SPC at most used      Prior Functioning/Environment Prior Level of Function : Independent/Modified Independent             Mobility Comments: SPC vs RW ADLs Comments: has transport for HD        OT Problem List: Decreased strength;Decreased range of motion;Decreased activity tolerance;Impaired balance (sitting and/or standing);Decreased safety awareness;Decreased knowledge of use of DME or AE;Decreased knowledge of precautions      OT Treatment/Interventions: Self-care/ADL training;Therapeutic exercise;DME and/or AE instruction;Therapeutic activities;Balance training;Patient/family education    OT Goals(Current goals can be found in the care plan section) Acute Rehab OT Goals Patient Stated Goal: to walk OT Goal Formulation: With patient Time For Goal Achievement: 09/04/22 Potential to Achieve Goals: Good ADL Goals Pt Will Perform Upper Body Dressing: with set-up;sitting Pt Will Perform Lower Body Dressing: with min assist;sit to/from stand Pt Will Transfer to Toilet: with min assist;ambulating Pt Will Perform Toileting - Clothing Manipulation and hygiene: with supervision;sitting/lateral leans  OT Frequency: Min 2X/week    Co-evaluation              AM-PAC OT "6 Clicks" Daily Activity     Outcome Measure Help from another person eating meals?: A Lot Help from another person taking care of personal grooming?: A Lot Help from another person toileting, which includes using toliet, bedpan, or urinal?:  A Lot Help from another person bathing (including washing, rinsing, drying)?: A Lot Help from another person to put on and taking off regular upper body clothing?: A Lot Help from another person to  put on and taking off regular lower body clothing?: A Lot 6 Click Score: 12   End of Session Equipment Utilized During Treatment: Rolling walker (2 wheels) Nurse Communication: Mobility status  Activity Tolerance: Patient tolerated treatment well Patient left: in bed;with call bell/phone within reach;with bed alarm set;with nursing/sitter in room  OT Visit Diagnosis: Unsteadiness on feet (R26.81);Other abnormalities of gait and mobility (R26.89);Muscle weakness (generalized) (M62.81)                Time: [id number] OT Time Calculation (min): 18 min Charges:  OT General Charges $OT Visit: 1 Visit OT Evaluation $OT Eval Moderate Complexity: [place], OTR/L [place] Office [place] Communication Preferred   Elliot Cousin 08/21/2022, 2:52 PM

## 2022-08-22 DIAGNOSIS — G9340 Encephalopathy, unspecified: Secondary | ICD-10-CM | POA: Diagnosis not present

## 2022-08-22 LAB — CBC
HCT: 25.4 % — ABNORMAL LOW (ref 39.0–52.0)
Hemoglobin: 7.8 g/dL — ABNORMAL LOW (ref 13.0–17.0)
MCH: 30 pg (ref 26.0–34.0)
MCHC: 30.7 g/dL (ref 30.0–36.0)
MCV: 97.7 fL (ref 80.0–100.0)
Platelets: 144 10*3/uL — ABNORMAL LOW (ref 150–400)
RBC: 2.6 MIL/uL — ABNORMAL LOW (ref 4.22–5.81)
RDW: 18.1 % — ABNORMAL HIGH (ref 11.5–15.5)
WBC: 2.9 10*3/uL — ABNORMAL LOW (ref 4.0–10.5)
nRBC: 0 % (ref 0.0–0.2)

## 2022-08-22 LAB — BASIC METABOLIC PANEL
Anion gap: 11 (ref 5–15)
BUN: 46 mg/dL — ABNORMAL HIGH (ref 8–23)
CO2: 29 mmol/L (ref 22–32)
Calcium: 8.5 mg/dL — ABNORMAL LOW (ref 8.9–10.3)
Chloride: 94 mmol/L — ABNORMAL LOW (ref 98–111)
Creatinine, Ser: 11.09 mg/dL — ABNORMAL HIGH (ref 0.61–1.24)
GFR, Estimated: 5 mL/min — ABNORMAL LOW (ref >60)
Glucose, Bld: 79 mg/dL (ref 70–99)
Potassium: 4.9 mmol/L (ref 3.5–5.1)
Sodium: 134 mmol/L — ABNORMAL LOW (ref 135–145)

## 2022-08-22 LAB — HEPATITIS B SURFACE ANTIGEN: Hepatitis B Surface Ag: NONREACTIVE

## 2022-08-22 LAB — MAGNESIUM: Magnesium: 2.1 mg/dL (ref 1.7–2.4)

## 2022-08-22 LAB — GLUCOSE, CAPILLARY: Glucose-Capillary: 132 mg/dL — ABNORMAL HIGH (ref 70–99)

## 2022-08-22 MED ORDER — CHLORHEXIDINE GLUCONATE CLOTH 2 % EX PADS
6.0000 | MEDICATED_PAD | Freq: Every day | CUTANEOUS | Status: DC
  Administered 2022-08-23 – 2022-08-29 (×4): 6 via TOPICAL

## 2022-08-22 MED ORDER — CINACALCET HCL 30 MG PO TABS
90.0000 mg | ORAL_TABLET | ORAL | Status: DC
  Administered 2022-08-23 – 2022-08-27 (×2): 90 mg via ORAL
  Filled 2022-08-22 (×3): qty 3

## 2022-08-22 MED ORDER — CALCITRIOL 0.25 MCG PO CAPS
0.7500 ug | ORAL_CAPSULE | ORAL | Status: DC
  Administered 2022-08-23 – 2022-08-27 (×3): 0.75 ug via ORAL
  Filled 2022-08-22 (×3): qty 3

## 2022-08-22 MED ORDER — DARBEPOETIN ALFA 150 MCG/0.3ML IJ SOSY
150.0000 ug | PREFILLED_SYRINGE | INTRAMUSCULAR | Status: DC
  Administered 2022-08-23: 150 ug via SUBCUTANEOUS
  Filled 2022-08-22: qty 0.3

## 2022-08-22 NOTE — Evaluation (Addendum)
Physical Therapy Evaluation Patient Details Name: Raymond [name] MRN: [id number] DOB: 1956/05/25 Today's Date: 08/22/2022  History of Present Illness  67 yo male presented to  to the emergency department with altered mental status. Work up demonstrates Acute encephalopathy, Concern for hypovolemia/hypotension, and ambulatory dysfunction. PMHx: hypertension, polysubstance abuse, hepatitis C treated in 2014, ESRD on hemodialysis, paroxysmal atrial fibrillation not anticoagulated due to severe GI bleeding, and chronic pancytopenia  Clinical Impression  PTA, pt lives alone and uses SPC vs RW; has transportation to HD. Pt presents with significant change from his functional baseline. Pt with noted BUE dysmetria, involuntary shaking/jerking, and BLE ataxia, thus impairing transfers and gait. Pt requiring moderate assist to stand up to RW. Able to weight shift to R/L, but unable to take steps forward. + bilateral knee buckle. No apparent ROM or gross strength deficits noted (mild proximal BLE weakness). Will likely progress well if symptoms resolve. Will continue efforts.     Recommendations for follow up therapy are one component of a multi-disciplinary discharge planning process, led by the attending physician.  Recommendations may be updated based on patient status, additional functional criteria and insurance authorization.  Follow Up Recommendations Skilled nursing-short term rehab (<3 hours/day) Can patient physically be transported by private vehicle: No    Assistance Recommended at Discharge Frequent or constant Supervision/Assistance  Patient can return home with the following  Two people to help with walking and/or transfers;A little help with bathing/dressing/bathroom    Equipment Recommendations Other (comment) (TBA)  Recommendations for Other Services       Functional Status Assessment Patient has had a recent decline in their functional status and demonstrates the ability to make  significant improvements in function in a reasonable and predictable amount of time.     Precautions / Restrictions Precautions Precautions: Fall Precaution Comments: ataxic Restrictions Weight Bearing Restrictions: No      Mobility  Bed Mobility Overal bed mobility: Needs Assistance Bed Mobility: Supine to Sit, Sit to Supine     Supine to sit: Supervision Sit to supine: Supervision        Transfers Overall transfer level: Needs assistance Equipment used: Rolling walker (2 wheels) Transfers: Sit to/from Stand Sit to Stand: Mod assist, +2 safety/equipment           General transfer comment: ModA to rise from edge of bed x 2 for safety; able to weight shift to R/L, but as soon as he initiates taking steps, has BLE ataxia/incoordination and bilateral knee buckling    Ambulation/Gait                  Stairs            Wheelchair Mobility    Modified Rankin (Stroke Patients Only)       Balance Overall balance assessment: Needs assistance Sitting-balance support: Feet supported Sitting balance-Leahy Scale: Good     Standing balance support: Bilateral upper extremity supported, During functional activity Standing balance-Leahy Scale: Poor                               Pertinent Vitals/Pain Pain Assessment Pain Assessment: No/denies pain    Home Living Family/patient expects to be discharged to:: Private residence Living Arrangements: Alone Available Help at Discharge: Family;Friend(s) Type of Home: Apartment Home Access: Stairs to enter Entrance Stairs-Rails: Right;Left Entrance Stairs-Number of Steps: 4   Home Layout: One level Home Equipment: Cane - single point;Shower Land (2 wheels)  Additional Comments: pt is normally home with SPC at most used    Prior Function Prior Level of Function : Independent/Modified Independent             Mobility Comments: SPC vs RW ADLs Comments: has transport for HD      Hand Dominance   Dominant Hand: Right    Extremity/Trunk Assessment   Upper Extremity Assessment Upper Extremity Assessment: Defer to OT evaluation    Lower Extremity Assessment Lower Extremity Assessment: RLE deficits/detail;LLE deficits/detail RLE Deficits / Details: Hip flexion 3+/5 within limited range, otherwise [employer] [facility] RLE Coordination: decreased gross motor LLE Deficits / Details: Hip flexion 3+/5 within limited range, otherwise WFL LLE Coordination: decreased gross motor    Cervical / Trunk Assessment Cervical / Trunk Assessment: Normal  Communication   Communication: HOH  Cognition Arousal/Alertness: Awake/alert Behavior During Therapy: Flat affect, Impulsive Overall Cognitive Status: No family/caregiver present to determine baseline cognitive functioning                                 General Comments: Initially somnolent upon arrival; able to arouse with mod stimulation. Impulsivity noted but able to direct. Emerging awareness of deficits        General Comments      Exercises     Assessment/Plan    PT Assessment Patient needs continued PT services  PT Problem List Decreased balance;Decreased mobility;Decreased coordination       PT Treatment Interventions DME instruction;Gait training;Stair training;Functional mobility training;Therapeutic activities;Therapeutic exercise;Balance training;Patient/family education    PT Goals (Current goals can be found in the Care Plan section)  Acute Rehab PT Goals Patient Stated Goal: back to baseline PT Goal Formulation: With patient Time For Goal Achievement: 09/05/22 Potential to Achieve Goals: Good    Frequency Min 3X/week     Co-evaluation               AM-PAC PT "6 Clicks" Mobility  Outcome Measure Help needed turning from your back to your side while in a flat bed without using bedrails?: A Little Help needed moving from lying on your back to sitting on the side of a flat bed  without using bedrails?: A Little Help needed moving to and from a bed to a chair (including a wheelchair)?: A Lot Help needed standing up from a chair using your arms (e.g., wheelchair or bedside chair)?: A Lot Help needed to walk in hospital room?: Total Help needed climbing 3-5 steps with a railing? : Total 6 Click Score: 12    End of Session Equipment Utilized During Treatment: Gait belt Activity Tolerance: Patient tolerated treatment well Patient left: in bed;with call bell/phone within reach;with bed alarm set Nurse Communication: Mobility status PT Visit Diagnosis: Unsteadiness on feet (R26.81);Ataxic gait (R26.0)    Time: [id number] PT Time Calculation (min) (ACUTE ONLY): 16 min   Charges:   PT Evaluation $PT Eval Low Complexity: [place], PT, DPT Acute Rehabilitation Services Office [phone]   Deno Etienne 08/22/2022, 12:47 PM

## 2022-08-22 NOTE — Progress Notes (Signed)
PROGRESS NOTE    Raymond [name]  [id number] DOB: 20-Sep-1955 DOA: 08/20/2022 PCP: [name], FNP   Brief Narrative:  Raymond [name] is a 67 y.o. male with medical history significant for hypertension, polysubstance abuse, hepatitis C treated in 2014, ESRD on hemodialysis, paroxysmal atrial fibrillation not anticoagulated due to severe GI bleeding, and chronic pancytopenia who presents to the emergency department with altered mental status.   Patient has reportedly not been eating for the past couple days, was undergoing dialysis today, and was noted to become less responsive towards the end of treatment. Admitted to hospital team with nephrology consult(given HD).  Assessment & Plan:   Principal Problem:   Acute encephalopathy Active Problems:   Paroxysmal atrial fibrillation (HCC)   COPD (chronic obstructive pulmonary disease) (HCC)   Polysubstance abuse (HCC)   ESRD on hemodialysis (HCC)   Pancytopenia (HCC)   Uncontrolled hypertension   Prolonged QT interval  Acute encephalopathy, Resolved Concern for hypovolemia/hypotension - orthostatic hypotension ruled out Ambulatory dysfunction ongoing - Patient became obtunded towards the end of his dialysis session and was sent to ED where there is no acute findings on head CT, TSH and free T4 are normal, EtOH is undetectable, pCO2 is normal, and there are no significant electrolyte disturbances or evidence for infection  - Ammonia, B12 wnl/elevated respectively -PT/OT to follow given ongoing instability walking   ESRD TTS - No indication for urgent HD on admission - formally consult nephrology today given he will not be able to DC to outpatient HD slot given ongoing weakness/instability - Restrict fluids, renally-dose medications, continue phos binder     Hypertension  - Continue Coreg, hydralazine   CAD - Continue ASA, Lipitor, Coreg, and Imdur    PAF  - Not anticoagulated d/t hx of severe GI bleeding  - Continue ASA  and Coreg     Pancytopenia  - Appears to be chronic and stable  - Consider outpatient heme-onc follow up if ongoing    COPD  - No cough or wheezing on admission  - Continue Breo and as-needed albuterol    Prolonged QT interval  - QTc 613 ms in ED   - Mag level is 2.1, potassium 3.5  - Supplement potassium, continue cardiac monitoring, avoid QT-prolonging medications   DVT prophylaxis: Heparin Code Status: Full Family Communication: None present  Status is: Inpt  Dispo: The patient is from: Home              Anticipated d/c is to: Home              Anticipated d/c date is: 24-48h              Patient currently NOT medically stable for discharge  Consultants:  Nephrology  Procedures:  None  Antimicrobials:  None  Subjective: No acute issues or events overnight, mental status resolved - back to baseline - denies nausea vomiting headache fevers chills or chest pain - BLE weakness/instability ongoing however.  Objective: Vitals:   08/21/22 1511 08/21/22 2118 08/22/22 0118 08/22/22 0511  BP: 131/74 (!) 157/94 (!) 151/80 (!) 169/85  Pulse: 67 77 71 84  Resp: 18 19 16   $ Temp: 97.7 F (36.5 C) 98.4 F (36.9 C) 98.4 F (36.9 C) 98.4 F (36.9 C)  TempSrc: Oral Oral Oral Oral  SpO2: 99% 99% 99% 99%  Weight:      Height:        Intake/Output Summary (Last 24 hours) at 08/22/2022 0732 Last  data filed at 08/22/2022 0054 Gross per 24 hour  Intake 520 ml  Output --  Net 520 ml    Filed Weights   08/20/22 1546  Weight: 72.6 kg    Examination:  General:  Pleasantly resting in bed, No acute distress. HEENT:  Normocephalic atraumatic.  Sclerae nonicteric, noninjected.  Extraocular movements intact bilaterally. Neck:  Without mass or deformity.  Trachea is midline. Lungs:  Clear to auscultate bilaterally without rhonchi, wheeze, or rales. Heart:  Regular rate and rhythm.  Without murmurs, rubs, or gallops. Abdomen:  Soft, nontender, nondistended.  Without guarding  or rebound. Extremities: Left arm fistula - palpable thrill  Data Reviewed: I have personally reviewed following labs and imaging studies  CBC: Recent Labs  Lab 08/20/22 1628 08/20/22 1647 08/21/22 1059  WBC 2.4*  --  3.1*  NEUTROABS 1.1*  --   --   HGB 9.4* 10.9* 9.4*  HCT 29.8* 32.0* 29.4*  MCV 97.4  --  97.0  PLT 142*  --  99991111    Basic Metabolic Panel: Recent Labs  Lab 08/20/22 1628 08/20/22 1647 08/21/22 1059  NA 137 138 136  K 3.5 3.6 5.0  CL 93*  --  96*  CO2 32  --  28  GLUCOSE 87  --  68*  BUN 24*  --  32*  CREATININE 7.12*  --  9.03*  CALCIUM 8.3*  --  9.0  MG 2.1  --   --   PHOS 3.2  --   --     GFR: Estimated Creatinine Clearance: 7.1 mL/min (A) (by C-G formula based on SCr of 9.03 mg/dL (H)).  Liver Function Tests: Recent Labs  Lab 08/20/22 1628  AST 11*  ALT 9  ALKPHOS 85  BILITOT 0.3  PROT 7.6  ALBUMIN 3.2*    Recent Labs  Lab 08/20/22 2040  AMMONIA 16    CBG: Recent Labs  Lab 08/20/22 1653  GLUCAP 77   Thyroid Function Tests: Recent Labs    08/20/22 1628 08/20/22 1629  TSH  --  3.018  FREET4 0.85  --     Anemia Panel: Recent Labs    08/20/22 2040  VITAMINB12 1,982*    No results found for this or any previous visit (from the past 240 hour(s)).   Radiology Studies: CT Head Wo Contrast  Result Date: 08/20/2022 CLINICAL DATA:  Mental status change EXAM: CT HEAD WITHOUT CONTRAST TECHNIQUE: Contiguous axial images were obtained from the base of the skull through the vertex without intravenous contrast. RADIATION DOSE REDUCTION: This exam was performed according to the departmental dose-optimization program which includes automated exposure control, adjustment of the mA and/or kV according to patient size and/or use of iterative reconstruction technique. COMPARISON:  CT head 07/21/2022.  MRI brain 07/08/2022. FINDINGS: Brain: No evidence of acute infarction, hemorrhage, hydrocephalus, extra-axial collection or mass  lesion/mass effect. There is stable mild periventricular white matter hypodensity, likely chronic small vessel ischemic change. Vascular: Atherosclerotic calcifications are present within the cavernous internal carotid arteries. Skull: Normal. Negative for fracture or focal lesion. Sinuses/Orbits: No acute finding. Other: None. IMPRESSION: No acute intracranial abnormality. Electronically Signed   By: Ronney Asters M.D.   On: 08/20/2022 17:56   DG Chest Portable 1 View  Result Date: 08/20/2022 CLINICAL DATA:  Altered mental status EXAM: PORTABLE CHEST 1 VIEW COMPARISON:  07/30/2022 FINDINGS: Stable cardiomegaly. Aortic atherosclerosis. No focal airspace consolidation, pleural effusion, or pneumothorax. IMPRESSION: Cardiomegaly without acute cardiopulmonary disease. Electronically Signed   By: Hart Carwin  Plundo D.O.   On: 08/20/2022 16:48    Scheduled Meds:  aspirin  81 mg Oral Daily   atorvastatin  40 mg Oral Daily   carvedilol  12.5 mg Oral BID   fluticasone furoate-vilanterol  1 puff Inhalation Daily   heparin  5,000 Units Subcutaneous Q8H   hydrALAZINE  10 mg Oral Q8H   isosorbide mononitrate  15 mg Oral Daily   pantoprazole  40 mg Oral Daily   sevelamer carbonate  4,000 mg Oral TID WC   sodium chloride flush  3 mL Intravenous Q12H   Continuous Infusions:   LOS: 0 days   Time spent: 77mn  Rhydian Baldi C Bijan Ridgley, DO [facility]  If 7PM-7AM, please contact night-coverage [email or web address]  08/22/2022, 7:32 AM

## 2022-08-22 NOTE — Progress Notes (Signed)
Pt receives out-pt HD at [facility]) on TTS. Pt arrives at 11:30 for 11:50 chair time. Info provided to CSW for snf placement purposes. Will assist as needed.   Melven Sartorius Renal Navigator [phone]

## 2022-08-22 NOTE — Care Management Obs Status (Signed)
[place] NOTIFICATION   Patient Details  Name: Raymond [name] MRN: [id number] Date of Birth: 08-15-1955   Medicare Observation Status Notification Given:  Yes    Geralynn Ochs, LCSW 08/22/2022, 3:32 PM

## 2022-08-22 NOTE — NC FL2 (Cosign Needed Addendum)
[place] MEDICAID FL2 LEVEL OF CARE FORM     IDENTIFICATION  Patient Name: Raymond [name] Birthdate: 25-Mar-1956 Sex: male Admission Date (Current Location): 08/20/2022  [facility] and [place] Number:  Herbalist and Address:  [facility], [place] [address] [place], [place] [address]      Provider Number: [id number]  Attending Physician Name and Address:  Little Ishikawa, MD  Relative Name and Phone Number:       Current Level of Care: Hospital Recommended Level of Care: [place] Prior Approval Number:    Date Approved/Denied:   PASRR Number: [id number]  Discharge Plan: SNF    Current Diagnoses: Patient Active Problem List   Diagnosis Date Noted   Acute encephalopathy 08/20/2022   Prolonged QT interval 08/20/2022   Clostridium difficile diarrhea 0000000   Metabolic acidosis [id number]   Abnormal LFTs 07/03/2022   Hypervolemia associated with renal insufficiency 12/20/2021   Hypertension    Paroxysmal atrial fibrillation (HCC)    Substance abuse ([place])    Thrombocytopenia ([place]) 09/15/2021   Hypertensive urgency 09/13/2021   Uncontrolled hypertension    Uremia    Severe episode of recurrent major depressive disorder, without psychotic features ([place])    ESRD (end stage renal disease) on dialysis ([place]) 05/18/2021   Cardiac arrest ([place]) 04/03/2021   Anisocoria 04/03/2021   Acute on chronic diastolic CHF (congestive heart failure) ([place]) 04/03/2021   Hypothermia not due to cold exposure 04/03/2021   Junctional bradycardia 04/03/2021   Pancytopenia ([place]) 01/25/2021   AMS (altered mental status) 01/19/2021   Chronic viral hepatitis C ([place]) 12/25/2020   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 ([place]) 08/05/2020   Acute upper GI bleed 123456   Acute metabolic encephalopathy 123456   COVID-19 virus infection 07/29/2020   AVM (arteriovenous malformation) of stomach, acquired with hemorrhage    AVM  (arteriovenous malformation) of small bowel, acquired with hemorrhage    Current use of long term anticoagulation    Sensorineural hearing loss (SNHL) of both ears 03/30/2020   History of arteriovenous malformation (AVM) 01/14/2020   UGIB (upper gastrointestinal bleed) 01/14/2020   Acute blood loss anemia 01/14/2020   Elevated troponin 01/14/2020   Allergy, unspecified, initial encounter 04/29/2019   Gastroenteritis 12/24/2018   Substance use disorder 12/24/2018   Type 2 diabetes mellitus with other diabetic kidney complication ([place]) 123456   Noncompliance of patient with renal dialysis 09/12/2018   Benign neoplasm of colon    Melena    Anemia    H/O medication noncompliance    Polysubstance (excluding opioids) dependence ([place])    Atrial flutter with rapid ventricular response ([place]) 08/10/2018   Chest pain 08/10/2018   PAF (paroxysmal atrial fibrillation) ([place]) 08/01/2018   Acute respiratory failure with hypoxia ([place]) 07/13/2018   Non-compliance with renal dialysis 07/13/2018   PAD (peripheral artery disease) ([place]) 04/11/2018   Sepsis ([place]) 03/31/2018   Preop cardiovascular exam 03/29/2018   Idiopathic gout, unspecified site 12/13/2017   Other specified complication of vascular prosthetic devices, implants and grafts, initial encounter ([place]) 11/03/2017   Volume overload 08/07/2017   Dyspnea 06/20/2017   Acute bronchitis    COPD (chronic obstructive pulmonary disease) ([place])    ESRD on hemodialysis ([place])    Hypoxia 12/05/2016   Hyperkalemia 12/19/2015   Hypoglycemia 12/19/2015   Polysubstance abuse ([place]) 12/19/2015   Homeless 12/19/2015   Anemia associated with stage 5 chronic renal failure ([place]) 12/19/2015   BPH without urinary obstruction  10/29/2015   Erectile dysfunction 10/29/2015   Hematuria, gross 10/29/2015   Hypercalcemia 11/07/2014   Pruritus, unspecified 10/17/2014   Coagulation defect, unspecified ([place]) 09/17/2014   Thrombocytopenia, unspecified ([place])  10/17/2013   Iron deficiency anemia, unspecified 08/28/2013   Atherosclerosis of native arteries of extremity with intermittent claudication ([place]) 12/04/2012   ESRD (end stage renal disease) ([place]) 12/04/2012   Hypertensive chronic kidney disease with stage 5 chronic kidney disease or end stage renal disease ([place]) 11/20/2012   Secondary hyperparathyroidism of renal origin ([place]) 11/20/2012   HEPATITIS C 09/07/2006   HYPERCHOLESTEROLEMIA 09/07/2006   HYPERTENSION, BENIGN SYSTEMIC 09/07/2006    Orientation RESPIRATION BLADDER Height & Weight     Self, Time, Place  Normal Continent Weight: 160 lb (72.6 kg) Height:  5' 6"$  (167.6 cm)  BEHAVIORAL SYMPTOMS/MOOD NEUROLOGICAL BOWEL NUTRITION STATUS      Continent Diet (see d/c summary)  AMBULATORY STATUS COMMUNICATION OF NEEDS Skin   Extensive Assist Verbally Normal                       Personal Care Assistance Level of Assistance  Bathing, Feeding, Dressing Bathing Assistance: Limited assistance Feeding assistance: Independent Dressing Assistance: Limited assistance     Functional Limitations Info  Sight, Hearing, Speech Sight Info: Adequate Hearing Info: Adequate Speech Info: Adequate    SPECIAL CARE FACTORS FREQUENCY  OT (By licensed OT), PT (By licensed PT)     PT Frequency: 5x/week OT Frequency: 5x/week            Contractures      Additional Factors Info   Code Status  Allergies  Full code  No known allergies             Current Medications (08/22/2022):  This is the current hospital active medication list Current Facility-Administered Medications  Medication Dose Route Frequency Provider Last Rate Last Admin   acetaminophen (TYLENOL) tablet 650 mg  650 mg Oral Q6H PRN Opyd, Ilene Qua, MD       Or   acetaminophen (TYLENOL) suppository 650 mg  650 mg Rectal Q6H PRN Opyd, Ilene Qua, MD       albuterol (PROVENTIL) (2.5 MG/3ML) 0.083% nebulizer solution 2.5 mg  2.5 mg Nebulization Q4H PRN Opyd, Ilene Qua, MD       aspirin chewable tablet 81 mg  81 mg Oral Daily Opyd, Ilene Qua, MD   81 mg at 08/22/22 0853   atorvastatin (LIPITOR) tablet 40 mg  40 mg Oral Daily Opyd, Ilene Qua, MD   40 mg at 08/22/22 0854   [START ON 08/23/2022] calcitRIOL (ROCALTROL) capsule 0.75 mcg  0.75 mcg Oral Q T,Th,Sa-HD Elmarie Shiley, MD       carvedilol (COREG) tablet 12.5 mg  12.5 mg Oral BID Vianne Bulls, MD   12.5 mg at 08/22/22 0854   [START ON 08/23/2022] Chlorhexidine Gluconate Cloth 2 % PADS 6 each  6 each Topical Q0600 Elmarie Shiley, MD       [START ON 08/23/2022] cinacalcet (SENSIPAR) tablet 90 mg  90 mg Oral Q T,Th,Sa-HD Elmarie Shiley, MD       [START ON 08/23/2022] Darbepoetin Alfa (ARANESP) injection 150 mcg  150 mcg Subcutaneous Q Tue-1800 Elmarie Shiley, MD       fluticasone furoate-vilanterol (BREO ELLIPTA) 200-25 MCG/ACT 1 puff  1 puff Inhalation Daily Opyd, Ilene Qua, MD   1 puff at 08/22/22 0855   heparin injection 5,000 Units  5,000 Units Subcutaneous Q8H Opyd, Ilene Qua,  MD   5,000 Units at 08/22/22 0540   hydrALAZINE (APRESOLINE) tablet 10 mg  10 mg Oral Q8H Opyd, Ilene Qua, MD   10 mg at 08/22/22 0540   isosorbide mononitrate (IMDUR) 24 hr tablet 15 mg  15 mg Oral Daily Opyd, Ilene Qua, MD   15 mg at 08/22/22 0853   labetalol (NORMODYNE) injection 10 mg  10 mg Intravenous Q2H PRN Opyd, Ilene Qua, MD       pantoprazole (PROTONIX) EC tablet 40 mg  40 mg Oral Daily Opyd, Ilene Qua, MD   40 mg at 08/22/22 0853   sevelamer carbonate (RENVELA) tablet 4,000 mg  4,000 mg Oral TID WC Opyd, Ilene Qua, MD   4,000 mg at 08/21/22 1015   sodium chloride flush (NS) 0.9 % injection 3 mL  3 mL Intravenous Q12H Opyd, Ilene Qua, MD   3 mL at 08/22/22 1030   Facility-Administered Medications Ordered in Other Encounters  Medication Dose Route Frequency Provider Last Rate Last Admin   midazolam (VERSED) 5 MG/5ML injection    Anesthesia Intra-op Sammie Bench, CRNA   2 mg at 04/11/18 1042     Discharge Medications: Please  see discharge summary for a list of discharge medications.  Relevant Imaging Results:  Relevant Lab Results:   Additional Information SSN [id number] OPHD at [facility]. MWF, 10am chair time. Uses [employer] for transport.   [place], LCSW

## 2022-08-22 NOTE — TOC Initial Note (Addendum)
Transition of Care [facility]) - Initial/Assessment Note    Patient Details  Name: Raymond [name] MRN: [id number] Date of Birth: 12-14-1955  [facility]) CM/SW Contact:    Geralynn Ochs, LCSW Phone Number: 08/22/2022, 3:36 PM  Clinical Narrative:        CSW met with patient to discuss recommendation for SNF. Patient in agreement, says he has been to a couple SNFs in the past. Patient [facility] with [facility] if [facility] had a bed. CSW asked [facility] to review, awaiting response. CSW faxed out referral and provided bed offers for patient to review. Patient receives HD at [facility] on [address], MWF 10:00 chair time. Patient uses [employer] for transportation. CSW to follow.           Expected Discharge Plan: Skilled Nursing Facility Barriers to Discharge: Continued Medical Work up, Ship broker   Patient Goals and CMS Choice Patient states their goals for this hospitalization and ongoing recovery are:: to get better CMS Medicare.gov Compare Post Acute Care list provided to:: Patient Choice offered to / list presented to : Patient [place] ownership interest in [facility].provided to:: Patient    Expected Discharge Plan and Services     Post Acute Care Choice: [place] Living arrangements for the past 2 months: Single Family Home                                      Prior Living Arrangements/Services Living arrangements for the past 2 months: Single Family Home Lives with:: Self Patient language and need for interpreter reviewed:: No Do you feel safe going back to the place where you live?: Yes      Need for Family Participation in Patient Care: No (Comment) Care giver support system in place?: No (comment)   Criminal Activity/Legal Involvement Pertinent to Current Situation/Hospitalization: No - Comment as needed  Activities of Daily Living      Permission Sought/Granted Permission  sought to share information with : Facility Art therapist granted to share information with : Yes, Verbal Permission Granted     Permission granted to share info w AGENCY: SNF        Emotional Assessment Appearance:: Appears stated age Attitude/Demeanor/Rapport: Engaged Affect (typically observed): Appropriate Orientation: : Oriented to Self, Oriented to Place, Oriented to  Time Alcohol / Substance Use: Not Applicable Psych Involvement: No (comment)  Admission diagnosis:  Acute encephalopathy [G93.40] Patient Active Problem List   Diagnosis Date Noted   Acute encephalopathy 08/20/2022   Prolonged QT interval 08/20/2022   Clostridium difficile diarrhea 0000000   Metabolic acidosis [id number]   Abnormal LFTs 07/03/2022   Hypervolemia associated with renal insufficiency 12/20/2021   Hypertension    Paroxysmal atrial fibrillation (HCC)    Substance abuse (HCC)    Thrombocytopenia ([place]) 09/15/2021   Hypertensive urgency 09/13/2021   Uncontrolled hypertension    Uremia    Severe episode of recurrent major depressive disorder, without psychotic features ([place])    ESRD (end stage renal disease) on dialysis ([place]) 05/18/2021   Cardiac arrest ([place]) 04/03/2021   Anisocoria 04/03/2021   Acute on chronic diastolic CHF (congestive heart failure) ([place]) 04/03/2021   Hypothermia not due to cold exposure 04/03/2021   Junctional bradycardia 04/03/2021   Pancytopenia ([place]) 01/25/2021   AMS (altered mental status) 01/19/2021   Chronic viral hepatitis C ([place]) 12/25/2020  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 ([place]) 08/05/2020   Acute upper GI bleed 123456   Acute metabolic encephalopathy 123456   COVID-19 virus infection 07/29/2020   AVM (arteriovenous malformation) of stomach, acquired with hemorrhage    AVM (arteriovenous malformation) of small bowel, acquired with hemorrhage    Current use of long term anticoagulation    Sensorineural hearing  loss (SNHL) of both ears 03/30/2020   History of arteriovenous malformation (AVM) 01/14/2020   UGIB (upper gastrointestinal bleed) 01/14/2020   Acute blood loss anemia 01/14/2020   Elevated troponin 01/14/2020   Allergy, unspecified, initial encounter 04/29/2019   Gastroenteritis 12/24/2018   Substance use disorder 12/24/2018   Type 2 diabetes mellitus with other diabetic kidney complication ([place]) 123456   Noncompliance of patient with renal dialysis 09/12/2018   Benign neoplasm of colon    Melena    Anemia    H/O medication noncompliance    Polysubstance (excluding opioids) dependence (HCC)    Atrial flutter with rapid ventricular response ([place]) 08/10/2018   Chest pain 08/10/2018   PAF (paroxysmal atrial fibrillation) ([place]) 08/01/2018   Acute respiratory failure with hypoxia (HCC) 07/13/2018   Non-compliance with renal dialysis 07/13/2018   PAD (peripheral artery disease) ([place]) 04/11/2018   Sepsis ([place]) 03/31/2018   Preop cardiovascular exam 03/29/2018   Idiopathic gout, unspecified site 12/13/2017   Other specified complication of vascular prosthetic devices, implants and grafts, initial encounter ([place]) 11/03/2017   Volume overload 08/07/2017   Dyspnea 06/20/2017   Acute bronchitis    COPD (chronic obstructive pulmonary disease) ([place])    ESRD on hemodialysis ([place])    Hypoxia 12/05/2016   Hyperkalemia 12/19/2015   Hypoglycemia 12/19/2015   Polysubstance abuse ([place]) 12/19/2015   Homeless 12/19/2015   Anemia associated with stage 5 chronic renal failure ([place]) 12/19/2015   BPH without urinary obstruction 10/29/2015   Erectile dysfunction 10/29/2015   Hematuria, gross 10/29/2015   Hypercalcemia 11/07/2014   Pruritus, unspecified 10/17/2014   Coagulation defect, unspecified ([place]) 09/17/2014   Thrombocytopenia, unspecified ([place]) 10/17/2013   Iron deficiency anemia, unspecified 08/28/2013   Atherosclerosis of native arteries of extremity with intermittent claudication ([place])  12/04/2012   ESRD (end stage renal disease) ([place]) 12/04/2012   Hypertensive chronic kidney disease with stage 5 chronic kidney disease or end stage renal disease ([place]) 11/20/2012   Secondary hyperparathyroidism of renal origin ([place]) 11/20/2012   HEPATITIS C 09/07/2006   HYPERCHOLESTEROLEMIA 09/07/2006   HYPERTENSION, BENIGN SYSTEMIC 09/07/2006   PCP:  [name], FNP Pharmacy:   [facility], [address] [place] [address] Phone: [phone] Fax: [phone]  [id number] [id number]: [phone]: [phone] [facility] [address]: [phone]: [phone]    Social Determinants of Health (SDOH) Social History: SDOH Screenings   Food Insecurity: No Food Insecurity (12/29/2021)  Housing: Low Risk  (12/29/2021)  Transportation Needs: Unmet Transportation Needs (07/24/2022)  Financial Resource Strain: Low Risk  (12/29/2021)  Social Connections: Unknown (08/18/2018)  Tobacco Use: High Risk (08/20/2022)   SDOH Interventions:     Readmission Risk Interventions    07/07/2022    1:00 PM 12/01/2021   10:21 AM 11/26/2021    5:07 PM  Readmission Risk Prevention Plan  Transportation Screening Complete  Complete  Medication Review Press photographer) Complete  Complete  PCP or Specialist appointment within 3-5 days of discharge Complete Complete   HRI or Home Care Consult Complete Complete   SW Recovery Care/Counseling Consult Complete  Complete  Palliative Care Screening Not Applicable  Not [place] Not Applicable Not Applicable

## 2022-08-22 NOTE — Consult Note (Signed)
Reason for Consult: Continuity of ESRD care Referring Physician: Holli Humbles MD [facility])  HPI:  67 year old man with past medical history significant for hypertension, COPD, peripheral vascular disease, history of hepatitis C infection, paroxysmal atrial fibrillation, coronary artery disease and end-stage renal disease on hemodialysis on a TTS schedule.  He was admitted to the hospital 2 days ago after noted to have altered mental status towards the end of dialysis with preceding history of poor oral intake due to socioeconomic constraints.  He had also missed his dialysis treatment of 08/18/2022.  He denies any cough, sputum production, fever, chills, nausea, vomiting or diarrhea and his only complaint surrounds that of generalized weakness and inability to ambulate like he usually does.  Dialysis prescription: Monday Wednesday Friday, [facility], 180 dialyzer, 4 hours, BFR 400/DFR 600, EDW 69 kg, 2K/2.0 calcium, UF profile #4, left upper arm AV fistula, 15 G needles, no heparin.  Mircera 225 mcg every 2 weeks, Venofer 50 mg once a week, calcitriol 0.75 mcg 3 times weekly, Sensipar 90 mg 3 times weekly on HD.   Past Medical History:  Diagnosis Date   Anemia    CAD (coronary artery disease)    a. 03/2018: cath showing 33 to 30% stenosis along the LAD, luminal irregularities along the RCA, and angiographically normal LCx   COPD (chronic obstructive pulmonary disease) (HCC)    ESRD (end stage renal disease) on dialysis [facility])    "MWF; Jeneen Rinks" (07/22/17)   Hepatitis C    Still positive s/p liver biopsy at [facility]  and interferon therapy for 6 months. Most recent lab work was on 10/24/12   Hepatitis C    "took the tx; gone now" (12/05/2016)  Was treated   History of blood transfusion ~ 2012/2013   "related to my kidneys; blood was low"   Hypertension    Peripheral arterial disease ([place])    Substance abuse ([place])    Thyroid disease    Type 2 diabetes mellitus with other  diabetic kidney complication ([place]) [id number]    Past Surgical History:  Procedure Laterality Date   ABDOMINAL AORTOGRAM W/LOWER EXTREMITY N/A 02/08/2018   Procedure: ABDOMINAL AORTOGRAM W/LOWER EXTREMITY;  Surgeon: Conrad [place], MD;  Location: [place] CV LAB;  Service: Cardiovascular;  Laterality: N/A;   ABDOMINAL AORTOGRAM W/LOWER EXTREMITY Bilateral 06/15/2020   Procedure: ABDOMINAL AORTOGRAM W/LOWER EXTREMITY;  Surgeon: Waynetta Sandy, MD;  Location: [place] CV LAB;  Service: Cardiovascular;  Laterality: Bilateral;   AV FISTULA PLACEMENT Left Aug. 2013 ?   BIOPSY  01/17/2020   Procedure: BIOPSY;  Surgeon: Jackquline Denmark, MD;  Location: [place];  Service: Endoscopy;;   COLONOSCOPY WITH PROPOFOL N/A 08/21/2018   Procedure: COLONOSCOPY WITH PROPOFOL;  Surgeon: Yetta Flock, MD;  Location: [place];  Service: Gastroenterology;  Laterality: N/A;   ENTEROSCOPY N/A 01/17/2020   Procedure: ENTEROSCOPY;  Surgeon: Jackquline Denmark, MD;  Location: [facility] ENDOSCOPY;  Service: Endoscopy;  Laterality: N/A;   ENTEROSCOPY N/A 07/31/2020   Procedure: ENTEROSCOPY;  Surgeon: Doran Stabler, MD;  Location: [place];  Service: Gastroenterology;  Laterality: N/A;   ENTEROSCOPY N/A 01/27/2021   Procedure: ENTEROSCOPY;  Surgeon: Yetta Flock, MD;  Location: [facility] ENDOSCOPY;  Service: Gastroenterology;  Laterality: N/A;   ENTEROSCOPY N/A 07/22/2022   Procedure: ENTEROSCOPY;  Surgeon: Doran Stabler, MD;  Location: [facility] ENDOSCOPY;  Service: Gastroenterology;  Laterality: N/A;   ESOPHAGOGASTRODUODENOSCOPY (EGD) WITH PROPOFOL N/A 08/20/2018   Procedure: ESOPHAGOGASTRODUODENOSCOPY (EGD) WITH PROPOFOL;  Surgeon: Gatha Mayer, MD;  Location: [facility] ENDOSCOPY;  Service: Endoscopy;  Laterality: N/A;   FEMORAL-POPLITEAL BYPASS GRAFT Left 04/11/2018   Procedure: BYPASS GRAFT FEMORAL-POPLITEAL ARTERY LEFT LEG;  Surgeon: Rosetta Posner, MD;  Location: [facility] OR;  Service: Vascular;  Laterality: Left;    GIVENS CAPSULE STUDY N/A 07/31/2020   Procedure: GIVENS CAPSULE STUDY;  Surgeon: Doran Stabler, MD;  Location: [name];  Service: Gastroenterology;  Laterality: N/A;   HOT HEMOSTASIS N/A 08/20/2018   Procedure: HOT HEMOSTASIS (ARGON PLASMA COAGULATION/BICAP);  Surgeon: Gatha Mayer, MD;  Location: [facility] ENDOSCOPY;  Service: Endoscopy;  Laterality: N/A;   HOT HEMOSTASIS N/A 01/17/2020   Procedure: HOT HEMOSTASIS (ARGON PLASMA COAGULATION/BICAP);  Surgeon: Jackquline Denmark, MD;  Location: [facility] ENDOSCOPY;  Service: Endoscopy;  Laterality: N/A;   HOT HEMOSTASIS N/A 01/27/2021   Procedure: HOT HEMOSTASIS (ARGON PLASMA COAGULATION/BICAP);  Surgeon: Yetta Flock, MD;  Location: [facility] ENDOSCOPY;  Service: Gastroenterology;  Laterality: N/A;   HOT HEMOSTASIS N/A 07/22/2022   Procedure: HOT HEMOSTASIS (ARGON PLASMA COAGULATION/BICAP);  Surgeon: Doran Stabler, MD;  Location: [place];  Service: Gastroenterology;  Laterality: N/A;   LEFT HEART CATH AND CORONARY ANGIOGRAPHY N/A 03/29/2018   Procedure: LEFT HEART CATH AND CORONARY ANGIOGRAPHY;  Surgeon: Nelva Bush, MD;  Location: [place] CV LAB;  Service: Cardiovascular;  Laterality: N/A;   LIVER BIOPSY     ORIF ULNAR FRACTURE Left 05/18/2017   Procedure: OPEN REDUCTION INTERNAL FIXATION (ORIF) ULNAR FRACTURE;  Surgeon: Iran Planas, MD;  Location: [place];  Service: Orthopedics;  Laterality: Left;   PERIPHERAL VASCULAR INTERVENTION Right 06/15/2020   Procedure: PERIPHERAL VASCULAR INTERVENTION;  Surgeon: Waynetta Sandy, MD;  Location: [place] CV LAB;  Service: Cardiovascular;  Laterality: Right;   POLYPECTOMY  08/21/2018   Procedure: POLYPECTOMY;  Surgeon: Yetta Flock, MD;  Location: [place];  Service: Gastroenterology;;   Earney Mal[place] N/A 12/11/2012   Procedure: [place];  Surgeon: Serafina Mitchell, MD;  Location: [facility] CATH LAB;  Service: Cardiovascular;  Laterality: N/A;   WOUND EXPLORATION Left 06/15/2020    Procedure: GROIN  EXPLORATION;  Surgeon: Waynetta Sandy, MD;  Location: [facility] OR;  Service: Vascular;  Laterality: Left;    Family History  Problem Relation Age of Onset   Diabetes Father    Hypertension Father    Heart disease Father     Social History:  reports that he has been smoking cigarettes. He has a 25.00 pack-year smoking history. He has never used smokeless tobacco. He reports current alcohol use of about 10.0 standard drinks of alcohol per week. He reports that he does not currently use drugs after having used the following drugs: Cocaine.  Allergies: No Known Allergies  Medications: I have reviewed the patient's current medications. Scheduled:  aspirin  81 mg Oral Daily   atorvastatin  40 mg Oral Daily   carvedilol  12.5 mg Oral BID   fluticasone furoate-vilanterol  1 puff Inhalation Daily   heparin  5,000 Units Subcutaneous Q8H   hydrALAZINE  10 mg Oral Q8H   isosorbide mononitrate  15 mg Oral Daily   pantoprazole  40 mg Oral Daily   sevelamer carbonate  4,000 mg Oral TID WC   sodium chloride flush  3 mL Intravenous Q12H   Continuous:     Latest Ref Rng & Units 08/22/2022    7:09 AM 08/21/2022   10:59 AM 08/20/2022    4:47 PM  BMP  Glucose 70 - 99 mg/dL  79  68    BUN 8 - 23 mg/dL 46  32    Creatinine 0.61 - 1.24 mg/dL 11.09  9.03    Sodium 135 - 145 mmol/L 134  136  138   Potassium 3.5 - 5.1 mmol/L 4.9  5.0  3.6   Chloride 98 - 111 mmol/L 94  96    CO2 22 - 32 mmol/L 29  28    Calcium 8.9 - 10.3 mg/dL 8.5  9.0        Latest Ref Rng & Units 08/22/2022    7:09 AM 08/21/2022   10:59 AM 08/20/2022    4:47 PM  CBC  WBC 4.0 - 10.5 K/uL 2.9  3.1    Hemoglobin 13.0 - 17.0 g/dL 7.8  9.4  10.9   Hematocrit 39.0 - 52.0 % 25.4  29.4  32.0   Platelets 150 - 400 K/uL 144  155      CT Head Wo Contrast  Result Date: 08/20/2022 CLINICAL DATA:  Mental status change EXAM: CT HEAD WITHOUT CONTRAST TECHNIQUE: Contiguous axial images were obtained from the base of  the skull through the vertex without intravenous contrast. RADIATION DOSE REDUCTION: This exam was performed according to the departmental dose-optimization program which includes automated exposure control, adjustment of the mA and/or kV according to patient size and/or use of iterative reconstruction technique. COMPARISON:  CT head 07/21/2022.  MRI brain 07/08/2022. FINDINGS: Brain: No evidence of acute infarction, hemorrhage, hydrocephalus, extra-axial collection or mass lesion/mass effect. There is stable mild periventricular white matter hypodensity, likely chronic small vessel ischemic change. Vascular: Atherosclerotic calcifications are present within the cavernous internal carotid arteries. Skull: Normal. Negative for fracture or focal lesion. Sinuses/Orbits: No acute finding. Other: None. IMPRESSION: No acute intracranial abnormality. Electronically Signed   By: Ronney Asters M.D.   On: 08/20/2022 17:56   DG Chest Portable 1 View  Result Date: 08/20/2022 CLINICAL DATA:  Altered mental status EXAM: PORTABLE CHEST 1 VIEW COMPARISON:  07/30/2022 FINDINGS: Stable cardiomegaly. Aortic atherosclerosis. No focal airspace consolidation, pleural effusion, or pneumothorax. IMPRESSION: Cardiomegaly without acute cardiopulmonary disease. Electronically Signed   By: Davina Poke D.O.   On: 08/20/2022 16:48    Review of Systems  Constitutional:  Positive for activity change, appetite change and fatigue. Negative for chills and fever.  HENT:  Negative for nosebleeds, sore throat and trouble swallowing.   Eyes:  Negative for photophobia and visual disturbance.  Respiratory:  Negative for cough, shortness of breath and wheezing.   Cardiovascular:  Negative for chest pain and leg swelling.  Gastrointestinal:  Negative for abdominal pain, diarrhea, nausea and vomiting.  Genitourinary:  Negative for dysuria and hematuria.  Musculoskeletal:  Positive for gait problem. Negative for arthralgias and myalgias.   Skin:  Negative for rash and wound.  Neurological:  Positive for weakness. Negative for dizziness and light-headedness.   Blood pressure 126/80, pulse 71, temperature 97.9 F (36.6 C), temperature source Oral, resp. rate 18, height 5' 6"$  (1.676 m), weight 72.6 kg, SpO2 100 %. Physical Exam Vitals and nursing note reviewed.  Constitutional:      General: He is not in acute distress.    Appearance: Normal appearance. He is obese. He is not ill-appearing.  HENT:     Head: Normocephalic and atraumatic.     Right Ear: External ear normal.     Left Ear: External ear normal.     Nose: Nose normal.     Mouth/Throat:     Mouth: Mucous membranes  are moist.     Pharynx: Oropharynx is clear.  Eyes:     General: No scleral icterus.    Extraocular Movements: Extraocular movements intact.     Conjunctiva/sclera: Conjunctivae normal.  Cardiovascular:     Rate and Rhythm: Normal rate and regular rhythm.     Pulses: Normal pulses.     Heart sounds: Normal heart sounds.  Pulmonary:     Effort: Pulmonary effort is normal.     Breath sounds: Normal breath sounds. No wheezing or rales.  Abdominal:     General: Bowel sounds are normal. There is no distension.     Palpations: Abdomen is soft.     Tenderness: There is no abdominal tenderness.  Musculoskeletal:     Cervical back: Normal range of motion and neck supple. No rigidity.     Right lower leg: No edema.     Left lower leg: No edema.     Comments: Aneurysmal and tortuous pulsatile left upper arm AV fistula  Skin:    General: Skin is warm and dry.  Neurological:     Mental Status: He is alert and oriented to person, place, and time.    Assessment/Plan: 1.  Ambulatory dysfunction/bilateral lower extremity weakness: Unclear etiology with resolution of initial acute encephalopathy and negative imaging for acute intracranial process.  Awaiting additional evaluation by OT/PT today. 2.  End-stage renal disease: Continue hemodialysis on a TTS  schedule with next dialysis ordered for tomorrow.  He does not have any acute indications for dialysis at this time and we discussed the importance of adherence and the risks of missing dialysis. 3.  Hypertension: Blood pressures appear to be under decent control on carvedilol and hydralazine.  Monitor with hemodialysis tomorrow. 4.  Anemia of chronic illness: Hemoglobin/hematocrit appear to be trending down which raises some concern for occult blood loss (especially in the setting of his previous history of GI bleed).  Will reorder ESA with dialysis tomorrow. 5.  Secondary hyperparathyroidism: Resume renal diet and restarted back on sevelamer for phosphorus binding.  I will restart calcitriol and Cinacalcet for PTH control. 6.  Nutrition: Continue renal diet and restart renal multivitamin.  Mairen Wallenstein K. 08/22/2022, 11:44 AM

## 2022-08-23 DIAGNOSIS — G9340 Encephalopathy, unspecified: Secondary | ICD-10-CM | POA: Diagnosis not present

## 2022-08-23 LAB — RENAL FUNCTION PANEL
Albumin: 2.8 g/dL — ABNORMAL LOW (ref 3.5–5.0)
Anion gap: 15 (ref 5–15)
BUN: 56 mg/dL — ABNORMAL HIGH (ref 8–23)
CO2: 27 mmol/L (ref 22–32)
Calcium: 8.7 mg/dL — ABNORMAL LOW (ref 8.9–10.3)
Chloride: 94 mmol/L — ABNORMAL LOW (ref 98–111)
Creatinine, Ser: 13.33 mg/dL — ABNORMAL HIGH (ref 0.61–1.24)
GFR, Estimated: 4 mL/min — ABNORMAL LOW (ref >60)
Glucose, Bld: 140 mg/dL — ABNORMAL HIGH (ref 70–99)
Phosphorus: 6.1 mg/dL — ABNORMAL HIGH (ref 2.5–4.6)
Potassium: 4.6 mmol/L (ref 3.5–5.1)
Sodium: 136 mmol/L (ref 135–145)

## 2022-08-23 LAB — CBC
HCT: 25.3 % — ABNORMAL LOW (ref 39.0–52.0)
Hemoglobin: 7.8 g/dL — ABNORMAL LOW (ref 13.0–17.0)
MCH: 30 pg (ref 26.0–34.0)
MCHC: 30.8 g/dL (ref 30.0–36.0)
MCV: 97.3 fL (ref 80.0–100.0)
Platelets: 131 10*3/uL — ABNORMAL LOW (ref 150–400)
RBC: 2.6 MIL/uL — ABNORMAL LOW (ref 4.22–5.81)
RDW: 18.2 % — ABNORMAL HIGH (ref 11.5–15.5)
WBC: 2.7 10*3/uL — ABNORMAL LOW (ref 4.0–10.5)
nRBC: 0 % (ref 0.0–0.2)

## 2022-08-23 LAB — HEPATITIS B SURFACE ANTIBODY, QUANTITATIVE: Hep B S AB Quant (Post): >1000 m[IU]/mL (ref >9.9)

## 2022-08-23 LAB — MAGNESIUM: Magnesium: 2.1 mg/dL (ref 1.7–2.4)

## 2022-08-23 MED ORDER — LIDOCAINE-PRILOCAINE 2.5-2.5 % EX CREA: 1.0000 | TOPICAL_CREAM | CUTANEOUS | Status: DC | PRN

## 2022-08-23 MED ORDER — LIDOCAINE HCL (PF) 1 % IJ SOLN: 5.0000 mL | INTRAMUSCULAR | Status: DC | PRN

## 2022-08-23 MED ORDER — PENTAFLUOROPROP-TETRAFLUOROETH EX AERO: 1.0000 | INHALATION_SPRAY | CUTANEOUS | Status: DC | PRN

## 2022-08-23 NOTE — Progress Notes (Signed)
PROGRESS NOTE    Raymond [name]  [id number] DOB: 1955-11-09 DOA: 08/20/2022 PCP: [name], FNP   Brief Narrative:  Raymond [name] is a 67 y.o. male with medical history significant for hypertension, polysubstance abuse, hepatitis C treated in 2014, ESRD on hemodialysis, paroxysmal atrial fibrillation not anticoagulated due to severe GI bleeding, and chronic pancytopenia who presents to the emergency department with altered mental status.   Patient has reportedly not been eating for the past couple days prior to admission, was undergoing dialysis Saturday 08/20/22 and was noted to become less responsive/obtunded towards the end of treatment. Admitted to hospital team with nephrology consult(given HD).  Mental status resolved back to baseline with supportive care but ongoing weakness/ambulatory dysfunction delayed discharge. PT evaluated and recommended SNF - medically stable for discharge awaiting safe disposition to SNF/Rehab at this time.  Assessment & Plan:   Principal Problem:   Acute encephalopathy Active Problems:   Paroxysmal atrial fibrillation (HCC)   COPD (chronic obstructive pulmonary disease) (HCC)   Polysubstance abuse (HCC)   ESRD on hemodialysis (HCC)   Pancytopenia (HCC)   Uncontrolled hypertension   Prolonged QT interval  Acute encephalopathy, Resolved Concern for hypovolemia/hypotension - orthostatic hypotension ruled out Ambulatory dysfunction ongoing - Patient became obtunded towards the end of his dialysis session and was sent to ED where there is no acute findings on head CT, TSH and free T4 are normal, EtOH is undetectable, pCO2 is normal, and there are no significant electrolyte disturbances or evidence for infection  - Ammonia, B12 wnl/elevated respectively -PT/OT to follow given ongoing instability walking - recommending SNF   ESRD TTS - No indication for urgent HD on admission - formally consult nephrology today given he will not be able to DC to  outpatient HD slot given ongoing weakness/instability - Restrict fluids, renally-dose medications, continue phos binder     Hypertension  - Continue Coreg, hydralazine   CAD - Continue ASA, Lipitor, Coreg, and Imdur    PAF  - Not anticoagulated d/t hx of severe GI bleeding  - Continue ASA and Coreg     Pancytopenia  - Appears to be chronic and stable  - Consider outpatient heme-onc follow up if ongoing    COPD  - No cough or wheezing on admission  - Continue Breo and as-needed albuterol    Prolonged QT interval  - QTc 613 ms in ED   - Mag level is 2.1, potassium 3.5  - Supplement potassium, continue cardiac monitoring, avoid QT-prolonging medications   DVT prophylaxis: Heparin Code Status: Full Family Communication: None present  Status is: Inpt  Dispo: The patient is from: Home              Anticipated d/c is to: Home              Anticipated d/c date is: 24-48h              Patient currently NOT medically stable for discharge  Consultants:  Nephrology  Procedures:  None  Antimicrobials:  None  Subjective: No acute issues or events overnight, mental status resolved - back to baseline - denies nausea vomiting headache fevers chills or chest pain - BLE weakness/instability ongoing however.  Objective: Vitals:   08/22/22 1937 08/22/22 2342 08/23/22 0359 08/23/22 0724  BP: 122/76 119/81 110/64 135/75  Pulse: 88 79 62 60  Resp: 18  18 16  $ Temp: 98 F (36.7 C) 98 F (36.7 C) 97.7 F (36.5 C) 98.3 F (  36.8 C)  TempSrc: Oral Oral Oral Oral  SpO2: 100% 98% 96% 99%  Weight:      Height:        Intake/Output Summary (Last 24 hours) at 08/23/2022 0750 Last data filed at 08/22/2022 1700 Gross per 24 hour  Intake 560 ml  Output --  Net 560 ml    Filed Weights   08/20/22 1546  Weight: 72.6 kg    Examination:  General:  Pleasantly resting in bed, No acute distress. HEENT:  Normocephalic atraumatic.  Sclerae nonicteric, noninjected.  Extraocular  movements intact bilaterally. Neck:  Without mass or deformity.  Trachea is midline. Lungs:  Clear to auscultate bilaterally without rhonchi, wheeze, or rales. Heart:  Regular rate and rhythm.  Without murmurs, rubs, or gallops. Abdomen:  Soft, nontender, nondistended.  Without guarding or rebound. Extremities: Left arm fistula - palpable thrill  Data Reviewed: I have personally reviewed following labs and imaging studies  CBC: Recent Labs  Lab 08/20/22 1628 08/20/22 1647 08/21/22 1059 08/22/22 0709 08/23/22 0336  WBC 2.4*  --  3.1* 2.9* 2.7*  NEUTROABS 1.1*  --   --   --   --   HGB 9.4* 10.9* 9.4* 7.8* 7.8*  HCT 29.8* 32.0* 29.4* 25.4* 25.3*  MCV 97.4  --  97.0 97.7 97.3  PLT 142*  --  155 144* 131*    Basic Metabolic Panel: Recent Labs  Lab 08/20/22 1628 08/20/22 1647 08/21/22 1059 08/22/22 0709 08/23/22 0336  NA 137 138 136 134*  --   K 3.5 3.6 5.0 4.9  --   CL 93*  --  96* 94*  --   CO2 32  --  28 29  --   GLUCOSE 87  --  68* 79  --   BUN 24*  --  32* 46*  --   CREATININE 7.12*  --  9.03* 11.09*  --   CALCIUM 8.3*  --  9.0 8.5*  --   MG 2.1  --   --  2.1 2.1  PHOS 3.2  --   --   --   --     GFR: Estimated Creatinine Clearance: 5.8 mL/min (A) (by C-G formula based on SCr of 11.09 mg/dL (H)).  Liver Function Tests: Recent Labs  Lab 08/20/22 1628  AST 11*  ALT 9  ALKPHOS 85  BILITOT 0.3  PROT 7.6  ALBUMIN 3.2*    Recent Labs  Lab 08/20/22 2040  AMMONIA 16    CBG: Recent Labs  Lab 08/20/22 1653 08/22/22 1139  GLUCAP 77 132*   Thyroid Function Tests: Recent Labs    08/20/22 1628 08/20/22 1629  TSH  --  3.018  FREET4 0.85  --     Anemia Panel: Recent Labs    08/20/22 2040  VITAMINB12 1,982*    No results found for this or any previous visit (from the past 240 hour(s)).   Radiology Studies: No results found.  Scheduled Meds:  aspirin  81 mg Oral Daily   atorvastatin  40 mg Oral Daily   calcitRIOL  0.75 mcg Oral Q T,Th,Sa-HD    carvedilol  12.5 mg Oral BID   Chlorhexidine Gluconate Cloth  6 each Topical Q0600   cinacalcet  90 mg Oral Q T,Th,Sa-HD   darbepoetin (ARANESP) injection - DIALYSIS  150 mcg Subcutaneous Q Tue-1800   fluticasone furoate-vilanterol  1 puff Inhalation Daily   heparin  5,000 Units Subcutaneous Q8H   hydrALAZINE  10 mg Oral Q8H   isosorbide  mononitrate  15 mg Oral Daily   pantoprazole  40 mg Oral Daily   sevelamer carbonate  4,000 mg Oral TID WC   sodium chloride flush  3 mL Intravenous Q12H   Continuous Infusions:   LOS: 0 days   Time spent: 44mn  Alfredia Desanctis C Teresa Nicodemus, DO [facility]  If 7PM-7AM, please contact night-coverage [email or web address]  08/23/2022, 7:50 AM

## 2022-08-23 NOTE — Plan of Care (Signed)
°  Problem: Education: °Goal: Knowledge of General Education information will improve °Description: Including pain rating scale, medication(s)/side effects and non-pharmacologic comfort measures °Outcome: Progressing °  °Problem: Health Behavior/Discharge Planning: °Goal: Ability to manage health-related needs will improve °Outcome: Progressing °  °Problem: Clinical Measurements: °Goal: Ability to maintain clinical measurements within normal limits will improve °Outcome: Progressing °Goal: Will remain free from infection °Outcome: Progressing °  °Problem: Activity: °Goal: Risk for activity intolerance will decrease °Outcome: Progressing °  °Problem: Nutrition: °Goal: Adequate nutrition will be maintained °Outcome: Progressing °  °Problem: Coping: °Goal: Level of anxiety will decrease °Outcome: Progressing °  °

## 2022-08-23 NOTE — Progress Notes (Signed)
Patient ID: Raymond [name], male   DOB: 1956/04/29, 67 y.o.   MRN: [id number] [facility] Progress Note   Assessment/ Plan:   1.  Ambulatory dysfunction/bilateral lower extremity weakness: Unclear etiology with resolution of initial acute encephalopathy and negative imaging for acute intracranial process.  Unclear etiology of relatively sudden development of bilateral lower extremity weakness; plans noted for continued strengthening/rehabilitation likely at SNF. 2.  End-stage renal disease: Continue hemodialysis on a TTS schedule with next dialysis ordered for later today.  We discussed the importance of adherence with dialysis 3.  Hypertension: Blood pressures appear to be under decent control on carvedilol and hydralazine.  Will continue to follow with hemodialysis/ultrafiltration. 4.  Anemia of chronic illness: Hemoglobin/hematocrit stable overnight after initial drop seen during hospitalization.  Hemoccult testing pending. 5.  Secondary hyperparathyroidism: Resume renal diet and restarted back on sevelamer for phosphorus binding.  Calcitriol and Cinacalcet restarted for PTH management. 6.  Nutrition: Continue renal diet and restart renal multivitamin.  Subjective:   Reports to be feeling fair and denies any acute events overnight.  Notes from care management and physical therapy noted from overnight.   Objective:   BP 135/75 (BP Location: Right Arm)   Pulse 60   Temp 98.3 F (36.8 C) (Oral)   Resp 16   Ht '5\' 6"'$  (1.676 m)   Wt 72.6 kg   SpO2 99%   BMI 25.82 kg/m   Physical Exam: Gen: Comfortably resting in bed, awaiting breakfast CVS: Pulse regular rhythm, normal rate, S1 and S2 normal Resp: Poor inspiratory effort with decreased breath sounds over bases, no distinct rales Abd: Soft, obese, nontender, bowel sounds normal Ext: No lower extremity edema.  Aneurysmal/tortuous pulsatile left upper extremity fistula  Labs: BMET Recent Labs  Lab 08/20/22 1628  08/20/22 1647 08/21/22 1059 08/22/22 0709  NA 137 138 136 134*  K 3.5 3.6 5.0 4.9  CL 93*  --  96* 94*  CO2 32  --  28 29  GLUCOSE 87  --  68* 79  BUN 24*  --  32* 46*  CREATININE 7.12*  --  9.03* 11.09*  CALCIUM 8.3*  --  9.0 8.5*  PHOS 3.2  --   --   --    CBC Recent Labs  Lab 08/20/22 1628 08/20/22 1647 08/21/22 1059 08/22/22 0709 08/23/22 0336  WBC 2.4*  --  3.1* 2.9* 2.7*  NEUTROABS 1.1*  --   --   --   --   HGB 9.4* 10.9* 9.4* 7.8* 7.8*  HCT 29.8* 32.0* 29.4* 25.4* 25.3*  MCV 97.4  --  97.0 97.7 97.3  PLT 142*  --  155 144* 131*      Medications:     aspirin  81 mg Oral Daily   atorvastatin  40 mg Oral Daily   calcitRIOL  0.75 mcg Oral Q T,Th,Sa-HD   carvedilol  12.5 mg Oral BID   Chlorhexidine Gluconate Cloth  6 each Topical Q0600   cinacalcet  90 mg Oral Q T,Th,Sa-HD   darbepoetin (ARANESP) injection - DIALYSIS  150 mcg Subcutaneous Q Tue-1800   fluticasone furoate-vilanterol  1 puff Inhalation Daily   heparin  5,000 Units Subcutaneous Q8H   hydrALAZINE  10 mg Oral Q8H   isosorbide mononitrate  15 mg Oral Daily   pantoprazole  40 mg Oral Daily   sevelamer carbonate  4,000 mg Oral TID WC   sodium chloride flush  3 mL Intravenous Q12H   Elmarie Shiley, MD 08/23/2022, 8:25  AM

## 2022-08-23 NOTE — Procedures (Signed)
Patient seen on Hemodialysis. BP 101/82   Pulse 72   Temp 98 F (36.7 C) (Oral)   Resp 13   Ht 5' 6"$  (1.676 m)   Wt 72.8 kg   SpO2 98%   BMI 25.90 kg/m   QB 350, UF goal 1.5L Tolerating treatment without complaints at this time.   Elmarie Shiley MD [facility]. Office # [phone] Pager # [phone] 9:53 AM

## 2022-08-23 NOTE — Procedures (Signed)
HD Note:  Some information was entered later than the data was gathered due to patient care needs. The stated time with the data is accurate.  Received patient in bed to unit.  Alert and oriented.  Informed consent signed and in chart.   TX duration: 3.5 hours  Goal lowered related to lowering SBP.  Dr. Graylon Gunning aware.  Patient tolerated well.  Transported back to the room  Alert, without acute distress.  Hand-off given to patient's nurse.   Access used: Left arm AVF Access issues: None  Total UF removed: [facility]

## 2022-08-23 NOTE — Progress Notes (Signed)
Physical Therapy Treatment Patient Details Name: Raymond [name] MRN: [id number] DOB: May 12, 1956 Today's Date: 08/23/2022   History of Present Illness 67 yo male presented to  to the emergency department with altered mental status. Work up demonstrates Acute encephalopathy, Concern for hypovolemia/hypotension, and ambulatory dysfunction. PMHx: hypertension, polysubstance abuse, hepatitis C treated in 2014, ESRD on hemodialysis, paroxysmal atrial fibrillation not anticoagulated due to severe GI bleeding, and chronic pancytopenia    PT Comments    Pt seen post HD; demonstrates almost complete resolution of symptoms with only mild LUE dysmetria and 1-2 episodes of min BLE ataxia during ambulation. Pt ambulating 180 ft with a walker and negotiated 3 steps using railings. Reports he still does not feel he is at his baseline and would be unable to go get his groceries and get on the bus in his current state. Continue to recommend SNF for ongoing Physical Therapy.      Recommendations for follow up therapy are one component of a multi-disciplinary discharge planning process, led by the attending physician.  Recommendations may be updated based on patient status, additional functional criteria and insurance authorization.  Follow Up Recommendations  Skilled nursing-short term rehab (<3 hours/day) Can patient physically be transported by private vehicle: No   Assistance Recommended at Discharge Frequent or constant Supervision/Assistance  Patient can return home with the following Assistance with cooking/housework;Assist for transportation;Help with stairs or ramp for entrance   Equipment Recommendations  None recommended by PT    Recommendations for Other Services       Precautions / Restrictions Precautions Precautions: Fall Restrictions Weight Bearing Restrictions: No     Mobility  Bed Mobility Overal bed mobility: Modified Independent                  Transfers Overall  transfer level: Needs assistance Equipment used: Rolling walker (2 wheels) Transfers: Sit to/from Stand Sit to Stand: Min guard                Ambulation/Gait Ambulation/Gait assistance: Min guard, Min assist Gait Distance (Feet): 180 Feet Assistive device: Rolling walker (2 wheels) Gait Pattern/deviations: Step-through pattern, Decreased stride length Gait velocity: decreased     General Gait Details: Mild intermittent BLE ataxia, overall min guard-light minA for balance   Stairs Stairs: Yes Stairs assistance: Min guard Stair Management: Two rails Number of Stairs: 3     Wheelchair Mobility    Modified Rankin (Stroke Patients Only)       Balance Overall balance assessment: Needs assistance Sitting-balance support: Feet supported Sitting balance-Leahy Scale: Good     Standing balance support: During functional activity, No upper extremity supported Standing balance-Leahy Scale: Fair                              Cognition Arousal/Alertness: Awake/alert Behavior During Therapy: Flat affect Overall Cognitive Status: No family/caregiver present to determine baseline cognitive functioning                                 General Comments: Overall appropriate and following all commands.        Exercises      General Comments        Pertinent Vitals/Pain      Home Living  Prior Function            PT Goals (current goals can now be found in the care plan section) Acute Rehab PT Goals Patient Stated Goal: back to baseline PT Goal Formulation: With patient Time For Goal Achievement: 09/05/22 Potential to Achieve Goals: Good Progress towards PT goals: Progressing toward goals    Frequency    Min 3X/week      PT Plan Current plan remains appropriate    Co-evaluation              AM-PAC PT "6 Clicks" Mobility   Outcome Measure  Help needed turning from your back to  your side while in a flat bed without using bedrails?: None Help needed moving from lying on your back to sitting on the side of a flat bed without using bedrails?: None Help needed moving to and from a bed to a chair (including a wheelchair)?: A Little Help needed standing up from a chair using your arms (e.g., wheelchair or bedside chair)?: A Little Help needed to walk in hospital room?: A Little Help needed climbing 3-5 steps with a railing? : A Little 6 Click Score: 20    End of Session Equipment Utilized During Treatment: Gait belt Activity Tolerance: Patient tolerated treatment well Patient left: in bed;with call bell/phone within reach;with bed alarm set Nurse Communication: Mobility status PT Visit Diagnosis: Unsteadiness on feet (R26.81);Ataxic gait (R26.0)     Time: [id number] PT Time Calculation (min) (ACUTE ONLY): 14 min  Charges:  $Therapeutic Activity: 8-22 mins                     Wyona Almas, PT, DPT Acute Rehabilitation Services Office [phone]    Deno Etienne 08/23/2022, 4:51 PM

## 2022-08-23 NOTE — Progress Notes (Signed)
PT Cancellation Note  Patient Details Name: Raymond [name] MRN: [id number] DOB: [birth year]   Cancelled Treatment:    Reason Eval/Treat Not Completed: Patient at procedure or test/unavailable (HD)  Wyona Almas, PT, DPT Acute Rehabilitation Services Office [place] 08/23/2022, 9:31 AM

## 2022-08-24 ENCOUNTER — Observation Stay (HOSPITAL_COMMUNITY): Payer: Medicare HMO

## 2022-08-24 DIAGNOSIS — G9340 Encephalopathy, unspecified: Secondary | ICD-10-CM | POA: Diagnosis not present

## 2022-08-24 LAB — MAGNESIUM: Magnesium: 1.8 mg/dL (ref 1.7–2.4)

## 2022-08-24 MED ORDER — MELATONIN 3 MG PO TABS
3.0000 mg | ORAL_TABLET | Freq: Once | ORAL | Status: AC | PRN
  Administered 2022-08-24: 3 mg via ORAL
  Filled 2022-08-24: qty 1

## 2022-08-24 MED ORDER — ACETAMINOPHEN 325 MG PO TABS
650.0000 mg | ORAL_TABLET | Freq: Four times a day (QID) | ORAL | Status: DC | PRN
  Administered 2022-08-24 – 2022-08-29 (×7): 650 mg via ORAL
  Filled 2022-08-24 (×7): qty 2

## 2022-08-24 MED ORDER — RENA-VITE PO TABS
1.0000 | ORAL_TABLET | Freq: Every day | ORAL | Status: DC
  Administered 2022-08-24 – 2022-08-28 (×5): 1 via ORAL
  Filled 2022-08-24 (×5): qty 1

## 2022-08-24 NOTE — Progress Notes (Signed)
Patient ID: Raymond [name], male   DOB: 19-Oct-1955, 67 y.o.   MRN: [id number] [facility] Progress Note   Assessment/ Plan:   1.  Ambulatory dysfunction/bilateral lower extremity weakness: Initial encephalopathy has resolved however he continues to have weakness of bilateral lower extremities upon continued physical therapy evaluation as well as relative weakness of his left upper extremity.  There is no clear organic etiology noted for this and recommendations for short-term SNF rehabilitation noted. 2.  End-stage renal disease: Continue hemodialysis on a TTS schedule with next dialysis ordered again for tomorrow. 3.  Hypertension: Blood pressures appear to be under decent control on carvedilol and hydralazine.  Monitor with HD/UF 4.  Anemia of chronic illness: Hemoglobin/hematocrit stable overnight after initial drop seen during hospitalization.  Hemoccult testing pending. 5.  Secondary hyperparathyroidism: Resume renal diet and restarted back on sevelamer for phosphorus binding.  Calcitriol and Cinacalcet restarted for PTH management. 6.  Nutrition: Continue renal diet and renal multivitamin.  Subjective:   Tolerated dialysis well yesterday.  Still having bilateral lower extremity and left upper extremity weakness.   Objective:   BP 132/80 (BP Location: Right Arm)   Pulse 83   Temp 97.7 F (36.5 C) (Oral)   Resp 18   Ht 5' 6"$  (1.676 m)   Wt 72.8 kg   SpO2 100%   BMI 25.90 kg/m   Physical Exam: Gen: Comfortably resting in bed, speaking on cell phone CVS: Pulse regular rhythm, normal rate, S1 and S2 normal Resp: Decreased breath sounds over bilateral bases, no distinct rales/rhonchi Abd: Soft, obese, nontender, bowel sounds normal Ext: No lower extremity edema.  Aneurysmal/tortuous pulsatile left upper extremity fistula  Labs: BMET Recent Labs  Lab 08/20/22 1628 08/20/22 1647 08/21/22 1059 08/22/22 0709 08/23/22 0858  NA 137 138 136 134* 136  K 3.5 3.6 5.0 4.9  4.6  CL 93*  --  96* 94* 94*  CO2 32  --  28 29 27  $ GLUCOSE 87  --  68* 79 140*  BUN 24*  --  32* 46* 56*  CREATININE 7.12*  --  9.03* 11.09* 13.33*  CALCIUM 8.3*  --  9.0 8.5* 8.7*  PHOS 3.2  --   --   --  6.1*   CBC Recent Labs  Lab 08/20/22 1628 08/20/22 1647 08/21/22 1059 08/22/22 0709 08/23/22 0336  WBC 2.4*  --  3.1* 2.9* 2.7*  NEUTROABS 1.1*  --   --   --   --   HGB 9.4* 10.9* 9.4* 7.8* 7.8*  HCT 29.8* 32.0* 29.4* 25.4* 25.3*  MCV 97.4  --  97.0 97.7 97.3  PLT 142*  --  155 144* 131*      Medications:     aspirin  81 mg Oral Daily   atorvastatin  40 mg Oral Daily   calcitRIOL  0.75 mcg Oral Q T,Th,Sa-HD   carvedilol  12.5 mg Oral BID   Chlorhexidine Gluconate Cloth  6 each Topical Q0600   cinacalcet  90 mg Oral Q T,Th,Sa-HD   darbepoetin (ARANESP) injection - DIALYSIS  150 mcg Subcutaneous Q Tue-1800   fluticasone furoate-vilanterol  1 puff Inhalation Daily   heparin  5,000 Units Subcutaneous Q8H   hydrALAZINE  10 mg Oral Q8H   isosorbide mononitrate  15 mg Oral Daily   pantoprazole  40 mg Oral Daily   sevelamer carbonate  4,000 mg Oral TID WC   sodium chloride flush  3 mL Intravenous Q12H   Elmarie Shiley, MD 08/24/2022,  8:28 AM

## 2022-08-24 NOTE — Progress Notes (Signed)
Raymond [name]  [id number] DOB: [birth year] DOA: 08/20/2022 PCP: [name], FNP    Brief Narrative:  67 year old with a history of HTN, polysubstance abuse, hepatitis C treated in 2014, paroxysmal atrial fibrillation not anticoagulated due to severe GI bleeding, chronic pancytopenia, and ESRD on HD who presented to the ER with altered mental status which developed during dialysis.  Consultants:  Nephrology  Goals of Care:  Code Status: Full Code   DVT prophylaxis: Subcutaneous heparin  Interim Hx: Afebrile.  Vital signs stable.  Medically stable for discharge pending SNF arrangement.  Complaining of chronic left shoulder pain.  Denies chest pain or shortness of breath.  Tolerating dialysis well at this time.  She  Assessment & Plan:  Acute encephalopathy due to hypotension/hypovolemia Became obtunded near end of outpatient dialysis session -CT head on arrival at ER unrevealing -alcohol level undetectable -TSH and free T4 normal -pCO2 normal -no significant electrolyte derangements -no clinical evidence of active infection -ammonia and B12 not abnormal -likely simply due to hypovolemia/volume shifting  ESRD on HD TTS Nephrology following and attending to HD  Ambulatory dysfunction/generalized weakness Will require SNF for rehab stay  HTN Continue Coreg and hydralazine for now  CAD Continue aspirin, Lipitor, Imdur, and Coreg  Chronic paroxysmal atrial fibrillation Continue asa and Coreg - not anticoagulated due to history of severe GI bleeding  Chronic pancytopenia Stable  COPD Well compensated -continue Breo and as needed albuterol  Prolonged QTc Exercise care with medication choices  Family Communication:  Disposition: From home -now requires SNF for rehab stay   Objective: Blood pressure 121/78, pulse 86, temperature 97.8 F (36.6 C), temperature source Oral, resp. rate 18, height 5' 6"$  (1.676 m), weight 72.8 kg, SpO2 100 %.  Intake/Output Summary  (Last 24 hours) at 08/24/2022 1112 Last data filed at 08/24/2022 0800 Gross per 24 hour  Intake 703 ml  Output 1500 ml  Net -797 ml   Filed Weights   08/20/22 1546 08/23/22 0905  Weight: 72.6 kg 72.8 kg    Examination: General: No acute respiratory distress Lungs: Clear to auscultation bilaterally without wheezes or crackles Cardiovascular: Regular rate and rhythm without murmur gallop or rub normal S1 and S2 Abdomen: Nontender, nondistended, soft, bowel sounds positive, no rebound, no ascites, no appreciable mass Extremities: No significant cyanosis, clubbing, or edema bilateral lower extremities  CBC: Recent Labs  Lab 08/20/22 1628 08/20/22 1647 08/21/22 1059 08/22/22 0709 08/23/22 0336  WBC 2.4*  --  3.1* 2.9* 2.7*  NEUTROABS 1.1*  --   --   --   --   HGB 9.4*   < > 9.4* 7.8* 7.8*  HCT 29.8*   < > 29.4* 25.4* 25.3*  MCV 97.4  --  97.0 97.7 97.3  PLT 142*  --  155 144* 131*   < > = values in this interval not displayed.   Basic Metabolic Panel: Recent Labs  Lab 08/20/22 1628 08/20/22 1647 08/21/22 1059 08/22/22 0709 08/23/22 0336 08/23/22 0858 08/24/22 0318  NA 137   < > 136 134*  --  136  --   K 3.5   < > 5.0 4.9  --  4.6  --   CL 93*  --  96* 94*  --  94*  --   CO2 32  --  28 29  --  27  --   GLUCOSE 87  --  68* 79  --  140*  --   BUN 24*  --  32*  46*  --  56*  --   CREATININE 7.12*  --  9.03* 11.09*  --  13.33*  --   CALCIUM 8.3*  --  9.0 8.5*  --  8.7*  --   MG 2.1  --   --  2.1 2.1  --  1.8  PHOS 3.2  --   --   --   --  6.1*  --    < > = values in this interval not displayed.   GFR: Estimated Creatinine Clearance: 4.8 mL/min (A) (by C-G formula based on SCr of 13.33 mg/dL (H)).   Scheduled Meds:  aspirin  81 mg Oral Daily   atorvastatin  40 mg Oral Daily   calcitRIOL  0.75 mcg Oral Q T,Th,Sa-HD   carvedilol  12.5 mg Oral BID   Chlorhexidine Gluconate Cloth  6 each Topical Q0600   cinacalcet  90 mg Oral Q T,Th,Sa-HD   darbepoetin (ARANESP)  injection - DIALYSIS  150 mcg Subcutaneous Q Tue-1800   fluticasone furoate-vilanterol  1 puff Inhalation Daily   heparin  5,000 Units Subcutaneous Q8H   hydrALAZINE  10 mg Oral Q8H   isosorbide mononitrate  15 mg Oral Daily   multivitamin  1 tablet Oral QHS   pantoprazole  40 mg Oral Daily   sevelamer carbonate  4,000 mg Oral TID WC   sodium chloride flush  3 mL Intravenous Q12H      LOS: 0 days   Cherene Altes, MD [facility] Office  [phone] Pager - Text Page per Shea Evans  If 7PM-7AM, please contact night-coverage per Amion 08/24/2022, 11:12 AM

## 2022-08-24 NOTE — Progress Notes (Signed)
Occupational Therapy Treatment Patient Details Name: Raymond [name] MRN: [id number] DOB: 24-Jan-1956 Today's Date: 08/24/2022   History of present illness 68 yo male presented to  to the emergency department with altered mental status. Work up demonstrates Acute encephalopathy, Concern for hypovolemia/hypotension, and ambulatory dysfunction. PMHx: hypertension, polysubstance abuse, hepatitis C treated in 2014, ESRD on hemodialysis, paroxysmal atrial fibrillation not anticoagulated due to severe GI bleeding, and chronic pancytopenia   OT comments  Patient demonstrating good gains with OT treatment with patient able to stand at sink for grooming and donned gown while standing with min guard assist. Patient also min guard assist for safety with transfers and no episodes of buckling. Patient focused on walking to get stronger and performed mobility with min guard assist. Patient to continue to be followed by acute OT with discharge recommendations for SNF for continued OT treatment to address bathing, dressing, and toileting.    Recommendations for follow up therapy are one component of a multi-disciplinary discharge planning process, led by the attending physician.  Recommendations may be updated based on patient status, additional functional criteria and insurance authorization.    Follow Up Recommendations  Skilled nursing-short term rehab (<3 hours/day)     Assistance Recommended at Discharge Frequent or constant Supervision/Assistance  Patient can return home with the following  Assistance with cooking/housework;Direct supervision/assist for medications management;Direct supervision/assist for financial management;Assist for transportation;Help with stairs or ramp for entrance;A little help with walking and/or transfers;A little help with bathing/dressing/bathroom   Equipment Recommendations  None recommended by OT    Recommendations for Other Services      Precautions / Restrictions  Precautions Precautions: Fall Restrictions Weight Bearing Restrictions: No       Mobility Bed Mobility Overal bed mobility: Modified Independent             General bed mobility comments: no assistance to get to EOB or back to supine    Transfers Overall transfer level: Needs assistance Equipment used: Rolling walker (2 wheels) Transfers: Sit to/from Stand Sit to Stand: Min guard           General transfer comment: min guard to stand with verbal cues for hand placement     Balance Overall balance assessment: Needs assistance Sitting-balance support: Feet supported Sitting balance-Leahy Scale: Good     Standing balance support: During functional activity, No upper extremity supported Standing balance-Leahy Scale: Fair Standing balance comment: able to stand at sink for grooming with no UE support                           ADL either performed or assessed with clinical judgement   ADL Overall ADL's : Needs assistance/impaired     Grooming: Wash/dry hands;Wash/dry face;Oral care;Min guard;Standing Grooming Details (indicate cue type and reason): at sink         Upper Body Dressing : Standing;Min guard Upper Body Dressing Details (indicate cue type and reason): donned gown to cover back     Toilet Transfer: Minimal assistance;Rolling walker (2 wheels) Toilet Transfer Details (indicate cue type and reason): simulated to recliner                Extremity/Trunk Assessment              Vision       Perception     Praxis      Cognition Arousal/Alertness: Awake/alert Behavior During Therapy: Flat affect Overall Cognitive Status: No family/caregiver present to determine baseline  cognitive functioning                                          Exercises      Shoulder Instructions       General Comments      Pertinent Vitals/ Pain          Home Living                                           Prior Functioning/Environment              Frequency  Min 2X/week        Progress Toward Goals  OT Goals(current goals can now be found in the care plan section)  Progress towards OT goals: Progressing toward goals  Acute Rehab OT Goals Patient Stated Goal: get better OT Goal Formulation: With patient Time For Goal Achievement: 09/04/22 Potential to Achieve Goals: Good ADL Goals Pt Will Perform Upper Body Dressing: with set-up;sitting Pt Will Perform Lower Body Dressing: with min assist;sit to/from stand Pt Will Transfer to Toilet: with min assist;ambulating Pt Will Perform Toileting - Clothing Manipulation and hygiene: with supervision;sitting/lateral leans  Plan Discharge plan remains appropriate    Co-evaluation                 AM-PAC OT "6 Clicks" Daily Activity     Outcome Measure   Help from another person eating meals?: None Help from another person taking care of personal grooming?: A Little Help from another person toileting, which includes using toliet, bedpan, or urinal?: A Little Help from another person bathing (including washing, rinsing, drying)?: A Little Help from another person to put on and taking off regular upper body clothing?: A Little Help from another person to put on and taking off regular lower body clothing?: A Little 6 Click Score: 19    End of Session Equipment Utilized During Treatment: Gait belt;Rolling walker (2 wheels)  OT Visit Diagnosis: Unsteadiness on feet (R26.81);Other abnormalities of gait and mobility (R26.89);Muscle weakness (generalized) (M62.81)   Activity Tolerance Patient tolerated treatment well   Patient Left in bed;with call bell/phone within reach;with bed alarm set   Nurse Communication Mobility status        Time: [id number] OT Time Calculation (min): 28 min  Charges: OT General Charges $OT Visit: 1 Visit OT Treatments $Self Care/Home Management : 23-37 mins  Lodema Hong, [place]  Office [place] 08/24/2022, 2:25 PM

## 2022-08-24 NOTE — TOC Progression Note (Signed)
Transition of Care [facility]) - Progression Note    Patient Details  Name: DAIGAN MUDD MRN: [id number] Date of Birth: January 03, 1956  Transition of Care [facility]) CM/SW [place], [place] Phone Number: 08/24/2022, 12:22 PM  Clinical Narrative:   CSW met with patient this morning to discuss SNF offers. [facility] declined, so patient chose [facility]. CSW confirmed bed availability at [facility], attempted to initiate insurance authorization but patient is not managed by [facility]. CSW asked [place] to initiate authorization with [facility], awaiting approval. CSW to follow.    Expected Discharge Plan: [place] Barriers to Discharge: Continued Medical Work up, Ship broker  Expected Discharge Plan and [place] Choice: [place] Living arrangements for the past 2 months: Single Family Home                                       Social Determinants of Health (SDOH) Interventions SDOH Screenings   Food Insecurity: No Food Insecurity (12/29/2021)  Housing: Low Risk  (12/29/2021)  Transportation Needs: Unmet Transportation Needs (07/24/2022)  Financial Resource Strain: Low Risk  (12/29/2021)  Social Connections: Unknown (08/18/2018)  Tobacco Use: High Risk (08/20/2022)    Readmission Risk Interventions    07/07/2022    1:00 PM 12/01/2021   10:21 AM 11/26/2021    5:07 PM  Readmission Risk Prevention Plan  Transportation Screening Complete  Complete  Medication Review ([place]) Complete  Complete  PCP or Specialist appointment within 3-5 days of discharge Complete Complete   HRI or [place] Complete Complete   SW Recovery Care/Counseling Consult Complete  Complete  Palliative Care Screening Not Applicable  Not [place] Not Applicable Not Applicable

## 2022-08-25 ENCOUNTER — Encounter: Payer: Self-pay | Admitting: *Deleted

## 2022-08-25 DIAGNOSIS — G9340 Encephalopathy, unspecified: Secondary | ICD-10-CM | POA: Diagnosis not present

## 2022-08-25 LAB — CBC
HCT: 25.1 % — ABNORMAL LOW (ref 39.0–52.0)
Hemoglobin: 7.7 g/dL — ABNORMAL LOW (ref 13.0–17.0)
MCH: 30.3 pg (ref 26.0–34.0)
MCHC: 30.7 g/dL (ref 30.0–36.0)
MCV: 98.8 fL (ref 80.0–100.0)
Platelets: 126 10*3/uL — ABNORMAL LOW (ref 150–400)
RBC: 2.54 MIL/uL — ABNORMAL LOW (ref 4.22–5.81)
RDW: 18.7 % — ABNORMAL HIGH (ref 11.5–15.5)
WBC: 2.8 10*3/uL — ABNORMAL LOW (ref 4.0–10.5)
nRBC: 0 % (ref 0.0–0.2)

## 2022-08-25 LAB — RENAL FUNCTION PANEL
Albumin: 2.7 g/dL — ABNORMAL LOW (ref 3.5–5.0)
Anion gap: 15 (ref 5–15)
BUN: 37 mg/dL — ABNORMAL HIGH (ref 8–23)
CO2: 24 mmol/L (ref 22–32)
Calcium: 8.3 mg/dL — ABNORMAL LOW (ref 8.9–10.3)
Chloride: 98 mmol/L (ref 98–111)
Creatinine, Ser: 9.85 mg/dL — ABNORMAL HIGH (ref 0.61–1.24)
GFR, Estimated: 5 mL/min — ABNORMAL LOW (ref >60)
Glucose, Bld: 80 mg/dL (ref 70–99)
Phosphorus: 3.4 mg/dL (ref 2.5–4.6)
Potassium: 5 mmol/L (ref 3.5–5.1)
Sodium: 137 mmol/L (ref 135–145)

## 2022-08-25 MED ORDER — CARVEDILOL 6.25 MG PO TABS
6.2500 mg | ORAL_TABLET | Freq: Two times a day (BID) | ORAL | Status: DC
  Administered 2022-08-25 – 2022-08-29 (×8): 6.25 mg via ORAL
  Filled 2022-08-25 (×9): qty 1

## 2022-08-25 NOTE — Progress Notes (Signed)
TELFORD GIRDNER  [id number] DOB: 10/02/1955 DOA: 08/20/2022 PCP: [name], FNP    Brief Narrative:  67 year old with a history of HTN, polysubstance abuse, hepatitis C treated in 2014, paroxysmal atrial fibrillation not anticoagulated due to severe GI bleeding, chronic pancytopenia, and ESRD on HD who presented to the ER with altered mental status which developed during dialysis.  Consultants:  Nephrology  Goals of Care:  Code Status: Full Code   DVT prophylaxis: Subcutaneous heparin  Interim Hx: No acute events reported overnight.  Vital signs stable.  Afebrile.  Mild degenerative changes of left shoulder noted on plain film x-ray with no acute findings.  Assessment & Plan:  Acute encephalopathy due to hypotension/hypovolemia Became obtunded near end of outpatient dialysis session -CT head on arrival at ER unrevealing -alcohol level undetectable -TSH and free T4 normal -pCO2 normal -no significant electrolyte derangements -no clinical evidence of active infection -ammonia and B12 not abnormal -likely simply due to hypovolemia/volume shifting  ESRD on HD TTS Nephrology following and attending to HD  Ambulatory dysfunction/generalized weakness Will require SNF for rehab stay - medically cleared for d/c when bed obtained   HTN Adjust medical tx to avoid episodic hypotension   CAD Continue aspirin, Lipitor, Imdur, and Coreg  Chronic paroxysmal atrial fibrillation Continue asa and Coreg - not anticoagulated due to history of severe GI bleeding  Chronic pancytopenia Stable  COPD Well compensated -continue Breo and as needed albuterol  Prolonged QTc Exercise care with medication choices  Family Communication:  Disposition: From home - now requires SNF for rehab stay - medically ready for d/c    Objective: Blood pressure 125/79, pulse 71, temperature (!) 97.5 F (36.4 C), resp. rate 20, height 5' 6"$  (1.676 m), weight 71.3 kg, SpO2 100 %.  Intake/Output  Summary (Last 24 hours) at 08/25/2022 1004 Last data filed at 08/24/2022 1700 Gross per 24 hour  Intake 560 ml  Output 0 ml  Net 560 ml    Filed Weights   08/23/22 0905 08/25/22 0410 08/25/22 0810  Weight: 72.8 kg 71.4 kg 71.3 kg    Examination: General: No acute respiratory distress Lungs: Clear to auscultation bilaterally  Cardiovascular: Regular rate and rhythm without murmur Abdomen: Nontender, nondistended, soft, bowel sounds positive, no rebound Extremities: No significant edema bilateral lower extremities  CBC: Recent Labs  Lab 08/20/22 1628 08/20/22 1647 08/22/22 0709 08/23/22 0336 08/25/22 0609  WBC 2.4*   < > 2.9* 2.7* 2.8*  NEUTROABS 1.1*  --   --   --   --   HGB 9.4*   < > 7.8* 7.8* 7.7*  HCT 29.8*   < > 25.4* 25.3* 25.1*  MCV 97.4   < > 97.7 97.3 98.8  PLT 142*   < > 144* 131* 126*   < > = values in this interval not displayed.    Basic Metabolic Panel: Recent Labs  Lab 08/20/22 1628 08/20/22 1647 08/22/22 0709 08/23/22 0336 08/23/22 0858 08/24/22 0318 08/25/22 0609  NA 137   < > 134*  --  136  --  137  K 3.5   < > 4.9  --  4.6  --  5.0  CL 93*   < > 94*  --  94*  --  98  CO2 32   < > 29  --  27  --  24  GLUCOSE 87   < > 79  --  140*  --  80  BUN 24*   < >  46*  --  56*  --  37*  CREATININE 7.12*   < > 11.09*  --  13.33*  --  9.85*  CALCIUM 8.3*   < > 8.5*  --  8.7*  --  8.3*  MG 2.1  --  2.1 2.1  --  1.8  --   PHOS 3.2  --   --   --  6.1*  --  3.4   < > = values in this interval not displayed.    GFR: Estimated Creatinine Clearance: 6.5 mL/min (A) (by C-G formula based on SCr of 9.85 mg/dL (H)).   Scheduled Meds:  aspirin  81 mg Oral Daily   atorvastatin  40 mg Oral Daily   calcitRIOL  0.75 mcg Oral Q T,Th,Sa-HD   carvedilol  12.5 mg Oral BID   Chlorhexidine Gluconate Cloth  6 each Topical Q0600   cinacalcet  90 mg Oral Q T,Th,Sa-HD   darbepoetin (ARANESP) injection - DIALYSIS  150 mcg Subcutaneous Q Tue-1800   fluticasone  furoate-vilanterol  1 puff Inhalation Daily   heparin  5,000 Units Subcutaneous Q8H   hydrALAZINE  10 mg Oral Q8H   isosorbide mononitrate  15 mg Oral Daily   multivitamin  1 tablet Oral QHS   pantoprazole  40 mg Oral Daily   sevelamer carbonate  4,000 mg Oral TID WC   sodium chloride flush  3 mL Intravenous Q12H      LOS: 0 days   Cherene Altes, MD [facility] Office  [phone] Pager - Text Page per Shea Evans  If 7PM-7AM, please contact night-coverage per Amion 08/25/2022, 10:04 AM

## 2022-08-25 NOTE — Progress Notes (Signed)
Physical Therapy Treatment Patient Details Name: Raymond [name] MRN: [id number] DOB: [birth year] Today's Date: 08/25/2022   History of Present Illness 67 yo male presented to  to the emergency department with altered mental status. Work up demonstrates Acute encephalopathy, Concern for hypovolemia/hypotension, and ambulatory dysfunction. PMHx: hypertension, polysubstance abuse, hepatitis C treated in 2014, ESRD on hemodialysis, paroxysmal atrial fibrillation not anticoagulated due to severe GI bleeding, and chronic pancytopenia    PT Comments    Pt received in supine, agreeable to therapy session and with good participation and tolerance for transfer, gait and stair training with cane vs HHA. Pt needing up to minA for gait when unsupported and with mild ataxia/instability. VSS on RA per chart review and no acute s/sx distress during higher level balance challenge. Pt scored 13/24 on Dynamic Gait Index. Scores of 19 or fewer on DGI indicate increased risk of falls in community dwelling older adults. Continue to recommend short term low intensity post-acute rehab given that pt lives alone and is not yet back to functional baseline. Mobility specialist notified to work with him as well to help build back strength/endurance. Pt continues to benefit from PT services to progress toward functional mobility goals.    Recommendations for follow up therapy are one component of a multi-disciplinary discharge planning process, led by the attending physician.  Recommendations may be updated based on patient status, additional functional criteria and insurance authorization.  Follow Up Recommendations  Skilled nursing-short term rehab (<3 hours/day) Can patient physically be transported by private vehicle: Yes   Assistance Recommended at Discharge Frequent or constant Supervision/Assistance  Patient can return home with the following Assistance with cooking/housework;Assist for transportation;Help with stairs or  ramp for entrance   Equipment Recommendations  None recommended by PT    Recommendations for Other Services Other (comment) (mobility specialist team referral)     Precautions / Restrictions Precautions Precautions: Fall Restrictions Weight Bearing Restrictions: No     Mobility  Bed Mobility Overal bed mobility: Modified Independent             General bed mobility comments: Use of bed features to sit up    Transfers Overall transfer level: Needs assistance Equipment used: Rolling walker (2 wheels) Transfers: Sit to/from Stand Sit to Stand: Min guard           General transfer comment: min guard to stand with verbal cues for hand placement, from bed and to chair surfaces    Ambulation/Gait Ambulation/Gait assistance: Min guard, Min assist Gait Distance (Feet): 125 Feet (x2) Assistive device: Straight cane, 1 person hand held assist Gait Pattern/deviations: Step-through pattern, Decreased stride length, Drifts right/left (very mild ataxia without cane)       General Gait Details: Mild intermittent BLE ataxia, overall min guard-light minA for balance with HHA, mostly min guard with cane; pt with decreased stability with backward stepping and unable to significantly alter his gait speed; no LOB with head turns in hallway.   Stairs Stairs: Yes Stairs assistance: Min guard Stair Management: Two rails, With cane, Step to pattern, Forwards (cane +rail and then 2 rails) Number of Stairs: 3 (2+3 steps in PT gym (variable heights) x2 trials (~10 total steps but not in a row)) General stair comments: cues for safety and activity pacing, pt with some inconsistent foot placement/needs cues for taking his times and step sequencing to prevent LOB       Balance Overall balance assessment: Needs assistance Sitting-balance support: Feet supported Sitting balance-Leahy Scale: Good  Standing balance support: During functional activity, No upper extremity  supported Standing balance-Leahy Scale: Fair Standing balance comment: see DGI; fair balance with cane                 Standardized Balance Assessment Standardized Balance Assessment : Dynamic Gait Index   Dynamic Gait Index Level Surface: Mild Impairment Change in Gait Speed: Mild Impairment Gait with Horizontal Head Turns: Mild Impairment Gait with Vertical Head Turns: Mild Impairment Gait and Pivot Turn: Mild Impairment Step Over Obstacle: Moderate Impairment Step Around Obstacles: Moderate Impairment Steps: Moderate Impairment Total Score: 13      Cognition Arousal/Alertness: Awake/alert Behavior During Therapy: WFL for tasks assessed/performed Overall Cognitive Status: No family/caregiver present to determine baseline cognitive functioning                                 General Comments: Overall appropriate and following all commands, mild decreased insight into his deficits        Exercises      General Comments General comments (skin integrity, edema, etc.): VSS per chart review taken ~45 mins prior to session and no acute s/sx distress throughout. Mild fatigue score 2-3/10 modified RPE at end of session.      Pertinent Vitals/Pain Pain Assessment Pain Assessment: No/denies pain     PT Goals (current goals can now be found in the care plan section) Acute Rehab PT Goals Patient Stated Goal: back to baseline PT Goal Formulation: With patient Time For Goal Achievement: 09/05/22 Progress towards PT goals: Progressing toward goals    Frequency    Min 3X/week      PT Plan Current plan remains appropriate       AM-PAC PT "6 Clicks" Mobility   Outcome Measure  Help needed turning from your back to your side while in a flat bed without using bedrails?: None Help needed moving from lying on your back to sitting on the side of a flat bed without using bedrails?: None Help needed moving to and from a bed to a chair (including a  wheelchair)?: A Little Help needed standing up from a chair using your arms (e.g., wheelchair or bedside chair)?: A Little Help needed to walk in hospital room?: A Little Help needed climbing 3-5 steps with a railing? : A Little 6 Click Score: 20    End of Session Equipment Utilized During Treatment: Gait belt Activity Tolerance: Patient tolerated treatment well Patient left: in chair;with call bell/phone within reach;with chair alarm set Nurse Communication: Mobility status PT Visit Diagnosis: Unsteadiness on feet (R26.81);Ataxic gait (R26.0)     Time: [id number] PT Time Calculation (min) (ACUTE ONLY): 15 min  Charges:  $Gait Training: 8-22 mins                     Taliyah Watrous P., PTA Acute Rehabilitation Services Secure Chat Preferred 9a-5:30pm Office: [place] 08/25/2022, 5:07 PM

## 2022-08-25 NOTE — Procedures (Signed)
Patient seen on Hemodialysis. BP 125/77 (BP Location: Right Arm)   Pulse 71   Temp (!) 97.5 F (36.4 C)   Resp 12   Ht 5' 6"$  (1.676 m)   Wt 71.3 kg   SpO2 98%   BMI 25.37 kg/m   QB 400, UF goal 2L Tolerating treatment without complaints at this time.   Elmarie Shiley MD [facility]. Office # [phone] Pager # [phone] 10:15 AM

## 2022-08-25 NOTE — Progress Notes (Signed)
Received patient in bed to unit.  Alert and oriented.  Informed consent signed and in chart.   TX duration:  Patient tolerated well.  Transported back to the room  Alert, without acute distress.  Hand-off given to patient's nurse.   Access used: AVF Access issues: none  Total UF removed: 2L Medication(s) given: none Post HD VS: 143/92,72,100%,21,98.2 Post HD weight: 69.3kg   [facility]

## 2022-08-26 DIAGNOSIS — G9340 Encephalopathy, unspecified: Secondary | ICD-10-CM | POA: Diagnosis not present

## 2022-08-26 LAB — CBC
HCT: 25.5 % — ABNORMAL LOW (ref 39.0–52.0)
Hemoglobin: 7.9 g/dL — ABNORMAL LOW (ref 13.0–17.0)
MCH: 30.6 pg (ref 26.0–34.0)
MCHC: 31 g/dL (ref 30.0–36.0)
MCV: 98.8 fL (ref 80.0–100.0)
Platelets: 120 10*3/uL — ABNORMAL LOW (ref 150–400)
RBC: 2.58 MIL/uL — ABNORMAL LOW (ref 4.22–5.81)
RDW: 19.1 % — ABNORMAL HIGH (ref 11.5–15.5)
WBC: 3 10*3/uL — ABNORMAL LOW (ref 4.0–10.5)
nRBC: 0 % (ref 0.0–0.2)

## 2022-08-26 NOTE — TOC Progression Note (Signed)
Transition of Care [facility]) - Progression Note    Patient Details  Name: Raymond [name] MRN: [id number] Date of Birth: 10/06/1955  Transition of Care [facility]) CM/SW [place], [place] Phone Number: 08/26/2022, 3:57 PM  Clinical Narrative:   CSW spoke with [facility] and confirmed that insurance authorization is still pending. Patient's [employer] is managed via [facility], and they are still reviewing case. CSW to follow.    Expected Discharge Plan: [place] Barriers to Discharge: Continued Medical Work up, Ship broker  Expected Discharge Plan and [place] Choice: [place] Living arrangements for the past 2 months: Single Family Home                                       Social Determinants of Health (SDOH) Interventions SDOH Screenings   Food Insecurity: No Food Insecurity (12/29/2021)  Housing: Low Risk  (12/29/2021)  Transportation Needs: Unmet Transportation Needs (07/24/2022)  Financial Resource Strain: Low Risk  (12/29/2021)  Social Connections: Unknown (08/18/2018)  Tobacco Use: High Risk (08/20/2022)    Readmission Risk Interventions    07/07/2022    1:00 PM 12/01/2021   10:21 AM 11/26/2021    5:07 PM  Readmission Risk Prevention Plan  Transportation Screening Complete  Complete  Medication Review ([place]) Complete  Complete  PCP or Specialist appointment within 3-5 days of discharge Complete Complete   HRI or [place] Complete Complete   SW Recovery Care/Counseling Consult Complete  Complete  Palliative Care Screening Not Applicable  Not [place] Not Applicable Not Applicable

## 2022-08-26 NOTE — Progress Notes (Signed)
Patient ID: Raymond [name], male   DOB: 04-02-1956, 67 y.o.   MRN: [id number] [facility] Progress Note   Assessment/ Plan:   1.  Ambulatory dysfunction/bilateral lower extremity weakness: Initial encephalopathy has resolved however he continues to have weakness of bilateral lower extremities upon continued physical therapy evaluation as well as relative weakness of his left upper extremity.  No structural etiology noted and awaiting short-term SNF placement to continue rehabilitation. 2.  End-stage renal disease: Continue hemodialysis on a TTS schedule with next dialysis ordered again for tomorrow. 3.  Hypertension: Blood pressures appear to be under decent control on carvedilol and hydralazine.  Monitor with HD/UF 4.  Anemia of chronic illness: Hemoglobin/hematocrit stable overnight after initial drop seen during hospitalization.  Hemoccult testing pending. 5.  Secondary hyperparathyroidism: Phosphorus level at goal on renal diet and sevelamer, remains on Cinacalcet and calcitriol for PTH control. 6.  Nutrition: Continue renal diet and renal multivitamin.  Subjective:   Feels like legs are getting stronger but still not back to 100% with improving strength left upper extremity.  No problems with dialysis yesterday.  Objective:   BP 110/71 (BP Location: Right Arm)   Pulse 73   Temp 98.9 F (37.2 C) (Oral)   Resp 14   Ht 5' 6"$  (1.676 m)   Wt 69.3 kg   SpO2 100%   BMI 24.66 kg/m   Physical Exam: Gen: Appears comfortable resting in bed, watching television CVS: Pulse regular rhythm, normal rate, S1 and S2 normal Resp: Clear to auscultation bilaterally without rales or rhonchi Abd: Soft, obese, nontender, bowel sounds normal Ext: No lower extremity edema.  Aneurysmal/tortuous pulsatile left upper extremity fistula  Labs: BMET Recent Labs  Lab 08/20/22 1628 08/20/22 1647 08/21/22 1059 08/22/22 0709 08/23/22 0858 08/25/22 0609  NA 137 138 136 134* 136 137  K 3.5  3.6 5.0 4.9 4.6 5.0  CL 93*  --  96* 94* 94* 98  CO2 32  --  28 29 27 24  $ GLUCOSE 87  --  68* 79 140* 80  BUN 24*  --  32* 46* 56* 37*  CREATININE 7.12*  --  9.03* 11.09* 13.33* 9.85*  CALCIUM 8.3*  --  9.0 8.5* 8.7* 8.3*  PHOS 3.2  --   --   --  6.1* 3.4   CBC Recent Labs  Lab 08/20/22 1628 08/20/22 1647 08/22/22 0709 08/23/22 0336 08/25/22 0609 08/26/22 0638  WBC 2.4*   < > 2.9* 2.7* 2.8* 3.0*  NEUTROABS 1.1*  --   --   --   --   --   HGB 9.4*   < > 7.8* 7.8* 7.7* 7.9*  HCT 29.8*   < > 25.4* 25.3* 25.1* 25.5*  MCV 97.4   < > 97.7 97.3 98.8 98.8  PLT 142*   < > 144* 131* 126* 120*   < > = values in this interval not displayed.      Medications:     aspirin  81 mg Oral Daily   atorvastatin  40 mg Oral Daily   calcitRIOL  0.75 mcg Oral Q T,Th,Sa-HD   carvedilol  6.25 mg Oral BID   Chlorhexidine Gluconate Cloth  6 each Topical Q0600   cinacalcet  90 mg Oral Q T,Th,Sa-HD   darbepoetin (ARANESP) injection - DIALYSIS  150 mcg Subcutaneous Q Tue-1800   fluticasone furoate-vilanterol  1 puff Inhalation Daily   heparin  5,000 Units Subcutaneous Q8H   isosorbide mononitrate  15 mg Oral Daily  multivitamin  1 tablet Oral QHS   pantoprazole  40 mg Oral Daily   sevelamer carbonate  4,000 mg Oral TID WC   sodium chloride flush  3 mL Intravenous Q12H   Elmarie Shiley, MD 08/26/2022, 8:37 AM

## 2022-08-26 NOTE — Progress Notes (Signed)
Mobility Specialist: Progress Note   08/26/22 1554  Mobility  Activity Ambulated with assistance in hallway  Level of Assistance Contact guard assist, steadying assist  Assistive Device Front wheel walker  Distance Ambulated (ft) 500 ft  Activity Response Tolerated well  Mobility Referral Yes  $Mobility charge 1 Mobility   Pt received in the bed and agreeable to mobility. Mod I with bed mobility as well as to stand. Contact guard during ambulation for balance. No c/o or overt LOB throughout. Pt sitting EOB after session with call bell and phone at his side. Bed alarm is on.   [place] Mobility Specialist Please contact via SecureChat or Rehab office at [phone]

## 2022-08-26 NOTE — Progress Notes (Signed)
Raymond [name]  [id number] DOB: 02-05-1956 DOA: 08/20/2022 PCP: [name], FNP    Brief Narrative:  67 year old with a history of HTN, polysubstance abuse, hepatitis C treated in 2014, paroxysmal atrial fibrillation not anticoagulated due to severe GI bleeding, chronic pancytopenia, and ESRD on HD who presented to the ER with altered mental status which developed during dialysis.  Consultants:  Nephrology  Goals of Care:  Code Status: Full Code   DVT prophylaxis: Subcutaneous heparin  Interim Hx: No acute events reported overnight.  Afebrile.  Vital signs stable.  Medically stable for discharge but needs SNF rehab stay and we continue to await insurance authorization for same.  Assessment & Plan:  Acute encephalopathy due to hypotension/hypovolemia Became obtunded near end of outpatient dialysis session -CT head on arrival at ER unrevealing -alcohol level undetectable -TSH and free T4 normal -pCO2 normal -no significant electrolyte derangements -no clinical evidence of active infection -ammonia and B12 not abnormal -likely simply due to hypovolemia/volume shifting  ESRD on HD TTS Nephrology following and attending to HD -has been tolerating dialysis without difficulty as an inpatient  Ambulatory dysfunction/generalized weakness Will require SNF for rehab stay - medically cleared for d/c when bed obtained   HTN Adjusted medical tx to avoid episodic hypotension   CAD Continue aspirin, Lipitor, Imdur, and Coreg  Chronic paroxysmal atrial fibrillation Continue asa and Coreg - not anticoagulated due to history of severe GI bleeding  Chronic pancytopenia Stable  COPD Well compensated -continue Breo and as needed albuterol  Prolonged QTc Exercise care with medication choices  Family Communication: No family present at time of exam Disposition: From home - now requires SNF for rehab stay - medically ready for d/c    Objective: Blood pressure 110/71, pulse 73,  temperature 98.9 F (37.2 C), temperature source Oral, resp. rate 14, height 5' 6"$  (1.676 m), weight 69.3 kg, SpO2 100 %.  Intake/Output Summary (Last 24 hours) at 08/26/2022 0934 Last data filed at 08/26/2022 0900 Gross per 24 hour  Intake 840 ml  Output 2000 ml  Net -1160 ml    Filed Weights   08/25/22 0410 08/25/22 0810 08/25/22 1238  Weight: 71.4 kg 71.3 kg 69.3 kg    Examination: General: No acute respiratory distress Lungs: Clear to auscultation B - no wheeze  Cardiovascular: Regular rate and rhythm without murmur Extremities: No significant edema bilateral lower extremities  CBC: Recent Labs  Lab 08/20/22 1628 08/20/22 1647 08/23/22 0336 08/25/22 0609 08/26/22 0638  WBC 2.4*   < > 2.7* 2.8* 3.0*  NEUTROABS 1.1*  --   --   --   --   HGB 9.4*   < > 7.8* 7.7* 7.9*  HCT 29.8*   < > 25.3* 25.1* 25.5*  MCV 97.4   < > 97.3 98.8 98.8  PLT 142*   < > 131* 126* 120*   < > = values in this interval not displayed.    Basic Metabolic Panel: Recent Labs  Lab 08/20/22 1628 08/20/22 1647 08/22/22 0709 08/23/22 0336 08/23/22 0858 08/24/22 0318 08/25/22 0609  NA 137   < > 134*  --  136  --  137  K 3.5   < > 4.9  --  4.6  --  5.0  CL 93*   < > 94*  --  94*  --  98  CO2 32   < > 29  --  27  --  24  GLUCOSE 87   < > 79  --  140*  --  80  BUN 24*   < > 46*  --  56*  --  37*  CREATININE 7.12*   < > 11.09*  --  13.33*  --  9.85*  CALCIUM 8.3*   < > 8.5*  --  8.7*  --  8.3*  MG 2.1  --  2.1 2.1  --  1.8  --   PHOS 3.2  --   --   --  6.1*  --  3.4   < > = values in this interval not displayed.    GFR: Estimated Creatinine Clearance: 6.5 mL/min (A) (by C-G formula based on SCr of 9.85 mg/dL (H)).   Scheduled Meds:  aspirin  81 mg Oral Daily   atorvastatin  40 mg Oral Daily   calcitRIOL  0.75 mcg Oral Q T,Th,Sa-HD   carvedilol  6.25 mg Oral BID   Chlorhexidine Gluconate Cloth  6 each Topical Q0600   cinacalcet  90 mg Oral Q T,Th,Sa-HD   darbepoetin (ARANESP)  injection - DIALYSIS  150 mcg Subcutaneous Q Tue-1800   fluticasone furoate-vilanterol  1 puff Inhalation Daily   heparin  5,000 Units Subcutaneous Q8H   isosorbide mononitrate  15 mg Oral Daily   multivitamin  1 tablet Oral QHS   pantoprazole  40 mg Oral Daily   sevelamer carbonate  4,000 mg Oral TID WC   sodium chloride flush  3 mL Intravenous Q12H      LOS: 0 days   Cherene Altes, MD [facility] Office  [phone] Pager - Text Page per Shea Evans  If 7PM-7AM, please contact night-coverage per Amion 08/26/2022, 9:34 AM

## 2022-08-26 NOTE — Progress Notes (Signed)
Physical Therapy Treatment Patient Details Name: Raymond [name] MRN: [id number] DOB: Dec 04, 1955 Today's Date: 08/26/2022   History of Present Illness 67 yo male presented to  to the emergency department with altered mental status. Work up demonstrates Acute encephalopathy, Concern for hypovolemia/hypotension, and ambulatory dysfunction. PMHx: hypertension, polysubstance abuse, hepatitis C treated in 2014, ESRD on hemodialysis, paroxysmal atrial fibrillation not anticoagulated due to severe GI bleeding, and chronic pancytopenia.    PT Comments    Pt received in supine, agreeable to therapy session and with good participation and tolerance for gait and stair negotiation. Pt needing up to minA for gait trial with x4 losses of balance requiring external assist to recover/correct in hallway and pt performed 8" step with bilateral rails and step-to pattern with increased time. Pt with only minimal fatigue reported at end of session, encouraged him to work with mobility specialist later in the day to build his strength/endurance. Of note, pt reports poor sleep and chronic insomnia. Pt continues to benefit from PT services to progress toward functional mobility goals.    Recommendations for follow up therapy are one component of a multi-disciplinary discharge planning process, led by the attending physician.  Recommendations may be updated based on patient status, additional functional criteria and insurance authorization.  Follow Up Recommendations  Skilled nursing-short term rehab (<3 hours/day) Can patient physically be transported by private vehicle: Yes   Assistance Recommended at Discharge Frequent or constant Supervision/Assistance  Patient can return home with the following Assistance with cooking/housework;Assist for transportation;Help with stairs or ramp for entrance   Equipment Recommendations  None recommended by PT    Recommendations for Other Services Other (comment) (mobility specialist  team referral)     Precautions / Restrictions Precautions Precautions: Fall Restrictions Weight Bearing Restrictions: No     Mobility  Bed Mobility Overal bed mobility: Modified Independent Bed Mobility: Supine to Sit     Supine to sit: Modified independent (Device/Increase time)     General bed mobility comments: Use of bed features to sit up, pt supine diagonally in bed when PTA arrived with his head toward R side rail and feet over the L side of the bed; pt seemingly unaware    Transfers Overall transfer level: Needs assistance Equipment used: Rolling walker (2 wheels) Transfers: Sit to/from Stand Sit to Stand: Min guard           General transfer comment: min guard to stand with verbal cues for hand placement, from bed and to chair surfaces    Ambulation/Gait Ambulation/Gait assistance: Min guard, Min assist Gait Distance (Feet): 150 Feet (x3 with longer standing breaks) Assistive device: None Gait Pattern/deviations: Step-through pattern, Decreased stride length, Drifts right/left (very mild ataxia without AD) Gait velocity: grossly 0.2-0.35 m/s     General Gait Details: Mild intermittent BLE ataxia, overall min guard-light minA for balance with HHA, pt with decreased stability with backward stepping and had difficulty sidestepping in hallway, he was unable to significantly alter his gait speed; x4 losses of balance total needing minA to recover via gait belt/forearm support.   Stairs Stairs: Yes Stairs assistance: Min guard Stair Management: Two rails, Step to pattern, Forwards Number of Stairs: 2 General stair comments: cues for safety and activity pacing, pt ascended 8" step to simulate stepping up onto public bus step, needing min guard for safety due to larger distance   Wheelchair Mobility    Modified Rankin (Stroke Patients Only)       Balance Overall balance assessment: Needs assistance Sitting-balance  support: Feet supported Sitting  balance-Leahy Scale: Good     Standing balance support: During functional activity, No upper extremity supported Standing balance-Leahy Scale: Fair Standing balance comment: see DGI                 Standardized Balance Assessment Standardized Balance Assessment : Dynamic Gait Index   Dynamic Gait Index Level Surface: Mild Impairment Change in Gait Speed: Mild Impairment Gait with Horizontal Head Turns: Normal Gait with Vertical Head Turns: Normal Gait and Pivot Turn: Mild Impairment Step Over Obstacle: Moderate Impairment Step Around Obstacles: Moderate Impairment Steps: Moderate Impairment Total Score: 15      Cognition Arousal/Alertness: Awake/alert Behavior During Therapy: WFL for tasks assessed/performed Overall Cognitive Status: No family/caregiver present to determine baseline cognitive functioning                General Comments: Overall appropriate and following all commands, mild decreased insight into his deficits        Exercises      General Comments General comments (skin integrity, edema, etc.): BP 105/68 taken supine just prior to OOB, HR 70's bpm SpO2 100% on RA; no acute s/sx distress throughout      Pertinent Vitals/Pain  Pt denies pain     PT Goals (current goals can now be found in the care plan section) Acute Rehab PT Goals Patient Stated Goal: back to baseline PT Goal Formulation: With patient Time For Goal Achievement: 09/05/22 Progress towards PT goals: Progressing toward goals    Frequency    Min 3X/week      PT Plan Current plan remains appropriate       AM-PAC PT "6 Clicks" Mobility   Outcome Measure  Help needed turning from your back to your side while in a flat bed without using bedrails?: None Help needed moving from lying on your back to sitting on the side of a flat bed without using bedrails?: None Help needed moving to and from a bed to a chair (including a wheelchair)?: A Little Help needed standing up  from a chair using your arms (e.g., wheelchair or bedside chair)?: A Little Help needed to walk in hospital room?: A Little Help needed climbing 3-5 steps with a railing? : A Lot (needs 2 rails) 6 Click Score: 19    End of Session Equipment Utilized During Treatment: Gait belt Activity Tolerance: Patient tolerated treatment well Patient left: in chair;with call bell/phone within reach;with chair alarm set Nurse Communication: Mobility status PT Visit Diagnosis: Unsteadiness on feet (R26.81);Ataxic gait (R26.0)     Time: [id number] PT Time Calculation (min) (ACUTE ONLY): 18 min  Charges:  $Gait Training: 8-22 mins                     Rob Mciver P., PTA Acute Rehabilitation Services Secure Chat Preferred 9a-5:30pm Office: [place] 08/26/2022, 2:12 PM

## 2022-08-27 DIAGNOSIS — G9340 Encephalopathy, unspecified: Secondary | ICD-10-CM | POA: Diagnosis not present

## 2022-08-27 MED ORDER — CHLORHEXIDINE GLUCONATE CLOTH 2 % EX PADS
6.0000 | MEDICATED_PAD | Freq: Every day | CUTANEOUS | Status: DC
  Administered 2022-08-28 – 2022-08-29 (×2): 6 via TOPICAL

## 2022-08-27 NOTE — Progress Notes (Signed)
Mobility Specialist: Progress Note   08/27/22 1342  Mobility  Activity Ambulated with assistance in hallway  Level of Assistance Contact guard assist, steadying assist  Assistive Device Front wheel walker  Distance Ambulated (ft) 500 ft  Activity Response Tolerated well  Mobility Referral Yes  $Mobility charge 1 Mobility   Pt received in the bed and agreeable to mobility. Mod I with bed mobility and contact guard during ambulation. No overt LOB. C/o general fatigue and feeling tired from not getting much sleep last night. Pt back to bed after session with call bell and phone at his side. Bed alarm is on.   [place] Thelton Graca Mobility Specialist Please contact via SecureChat or Rehab office at [phone]

## 2022-08-27 NOTE — Progress Notes (Signed)
Post Hemodialysis  08/27/22 2320  Vitals  Temp 98.4 F (36.9 C)  Pulse Rate 72  Resp 13  BP (!) 150/79  SpO2 97 %  O2 Device Room Air  Weight 72.6 kg  Type of Weight Pre-Dialysis  Oxygen Therapy  Patient Activity (if Appropriate) In bed  Pulse Oximetry Type Continuous  Post Treatment  Dialyzer Clearance Lightly streaked  Duration of HD Treatment -hour(s) 3.25 hour(s)  Liters Processed 78  Fluid Removed (mL) 1500 mL  Tolerated HD Treatment Yes  Post-Hemodialysis Comments Tx tolerated well, no complaints, no concerns

## 2022-08-27 NOTE — Progress Notes (Signed)
[facility] Progress Note  Subjective: seen in room, legs are getting stronger  Vitals:   08/26/22 2336 08/27/22 0341 08/27/22 0742 08/27/22 0814  BP: (!) 147/93 (!) 146/98  (!) 166/110  Pulse: (!) 40 (!) 108  (!) 106  Resp: 16 16  18  $ Temp: 98.7 F (37.1 C) 98.5 F (36.9 C)  98.2 F (36.8 C)  TempSrc: Oral Oral  Oral  SpO2: (!) 79% 97% 97% 100%  Weight:      Height:        Exam: Gen alert, no distress No jvd or bruits Chest clear bilat to bases RRR no MRG Abd soft ntnd no mass or ascites +bs Ext no LE edema Neuro is alert, Ox 3 , nf    LFA AVF+bruit    OP HD: TTS [facility]  4h   400/1.5  69kg  2/2 bath LFA AVF  Hep none - last HD 2/10 post wt 72.3kg - mircera 225 ug q2, last - venofer 50 weekly - rocaltrol 0.75 mcg po tiw - sensipar 90 mg po tiw  Assessment/ Plan: AMS/ gen'd weakness: Initial encephalopathy has resolved. Seen by PT.  No structural etiology noted and awaiting short-term SNF placement to continue rehabilitation. ESRD: on HD TTS. Plan HD today.  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     Kelly Splinter MD [facility] 08/27/2022, 8:38 AM  Recent Labs  Lab 08/23/22 0858 08/25/22 0609 08/26/22 0638  HGB  --  7.7* 7.9*  ALBUMIN 2.8* 2.7*  --   CALCIUM 8.7* 8.3*  --   PHOS 6.1* 3.4  --   CREATININE 13.33* 9.85*  --   K 4.6 5.0  --    No results for input(s): "IRON", "TIBC", "FERRITIN" in the last 168 hours. Inpatient medications:  aspirin  81 mg Oral Daily   atorvastatin  40 mg Oral Daily   calcitRIOL  0.75 mcg Oral Q T,Th,Sa-HD   carvedilol  6.25 mg Oral BID   Chlorhexidine Gluconate Cloth  6 each Topical Q0600   cinacalcet  90 mg Oral Q T,Th,Sa-HD   darbepoetin (ARANESP) injection - DIALYSIS  150 mcg Subcutaneous Q Tue-1800    fluticasone furoate-vilanterol  1 puff Inhalation Daily   heparin  5,000 Units Subcutaneous Q8H   isosorbide mononitrate  15 mg Oral Daily   multivitamin  1 tablet Oral QHS   pantoprazole  40 mg Oral Daily   sevelamer carbonate  4,000 mg Oral TID WC   sodium chloride flush  3 mL Intravenous Q12H    acetaminophen, albuterol, labetalol

## 2022-08-27 NOTE — Progress Notes (Signed)
Pt. Noted to no longer have IV access. Dr. Thereasa Solo made aware. Okay to leave Pt. Without IV access at this time.

## 2022-08-27 NOTE — Progress Notes (Signed)
Raymond [name]  [id number] DOB: 26-Oct-1955 DOA: 08/20/2022 PCP: [name], FNP    Brief Narrative:  67 year old with a history of HTN, polysubstance abuse, hepatitis C treated in 2014, paroxysmal atrial fibrillation not anticoagulated due to severe GI bleeding, chronic pancytopenia, and ESRD on HD who presented to the ER with altered mental status which developed during dialysis.  Consultants:  Nephrology  Goals of Care:  Code Status: Full Code   DVT prophylaxis: Subcutaneous heparin  Interim Hx: Awaiting insurance approval to allow for SNF placement.  Medically stable otherwise.  Afebrile.  Mild tachycardia with mildly elevated systolic blood pressure and diastolic blood pressure.  No complaints today.  Resting comfortably in bed.  Assessment & Plan:  Acute encephalopathy due to hypotension/hypovolemia Became obtunded near end of outpatient dialysis session -CT head on arrival at ER unrevealing - alcohol level undetectable - TSH and free T4 normal - pCO2 normal - no significant electrolyte derangements - no clinical evidence of active infection -ammonia and B12 not abnormal - likely simply due to hypovolemia/volume shifting  ESRD on HD TTS Nephrology following and attending to HD - has been tolerating dialysis without difficulty as an inpatient  Ambulatory dysfunction/generalized weakness Will require SNF for rehab stay - medically cleared for d/c -awaiting insurance approval  HTN Adjusted medical tx to avoid episodic hypotension - BP trending up - may require increased meds on non-HD days - follow for now   CAD Continue aspirin, Lipitor, Imdur, and Coreg  Chronic paroxysmal atrial fibrillation Continue asa and Coreg - not anticoagulated due to history of severe GI bleeding  Chronic pancytopenia Stable  COPD Well compensated -continue Breo and as needed albuterol  Prolonged QTc Exercise care with medication choices  Family Communication: No family present at  time of exam Disposition: From home - now requires SNF for rehab stay - medically ready for d/c - awaiting insurance approval    Objective: Blood pressure (!) 166/110, pulse (!) 106, temperature 98.2 F (36.8 C), temperature source Oral, resp. rate 18, height 5' 6"$  (1.676 m), weight 69.3 kg, SpO2 100 %.  Intake/Output Summary (Last 24 hours) at 08/27/2022 1002 Last data filed at 08/26/2022 2207 Gross per 24 hour  Intake 724 ml  Output 0 ml  Net 724 ml    Filed Weights   08/25/22 0410 08/25/22 0810 08/25/22 1238  Weight: 71.4 kg 71.3 kg 69.3 kg    Examination: General: No acute respiratory distress Lungs: Clear to auscultation B - no wheeze  Cardiovascular: Regular rate and rhythm  Extremities: No signif edema bilateral lower extremities  CBC: Recent Labs  Lab 08/20/22 1628 08/20/22 1647 08/23/22 0336 08/25/22 0609 08/26/22 0638  WBC 2.4*   < > 2.7* 2.8* 3.0*  NEUTROABS 1.1*  --   --   --   --   HGB 9.4*   < > 7.8* 7.7* 7.9*  HCT 29.8*   < > 25.3* 25.1* 25.5*  MCV 97.4   < > 97.3 98.8 98.8  PLT 142*   < > 131* 126* 120*   < > = values in this interval not displayed.    Basic Metabolic Panel: Recent Labs  Lab 08/20/22 1628 08/20/22 1647 08/22/22 0709 08/23/22 0336 08/23/22 0858 08/24/22 0318 08/25/22 0609  NA 137   < > 134*  --  136  --  137  K 3.5   < > 4.9  --  4.6  --  5.0  CL 93*   < > 94*  --  94*  --  98  CO2 32   < > 29  --  27  --  24  GLUCOSE 87   < > 79  --  140*  --  80  BUN 24*   < > 46*  --  56*  --  37*  CREATININE 7.12*   < > 11.09*  --  13.33*  --  9.85*  CALCIUM 8.3*   < > 8.5*  --  8.7*  --  8.3*  MG 2.1  --  2.1 2.1  --  1.8  --   PHOS 3.2  --   --   --  6.1*  --  3.4   < > = values in this interval not displayed.    GFR: Estimated Creatinine Clearance: 6.5 mL/min (A) (by C-G formula based on SCr of 9.85 mg/dL (H)).   Scheduled Meds:  aspirin  81 mg Oral Daily   atorvastatin  40 mg Oral Daily   calcitRIOL  0.75 mcg Oral Q  T,Th,Sa-HD   carvedilol  6.25 mg Oral BID   Chlorhexidine Gluconate Cloth  6 each Topical Q0600   cinacalcet  90 mg Oral Q T,Th,Sa-HD   darbepoetin (ARANESP) injection - DIALYSIS  150 mcg Subcutaneous Q Tue-1800   fluticasone furoate-vilanterol  1 puff Inhalation Daily   heparin  5,000 Units Subcutaneous Q8H   isosorbide mononitrate  15 mg Oral Daily   multivitamin  1 tablet Oral QHS   pantoprazole  40 mg Oral Daily   sevelamer carbonate  4,000 mg Oral TID WC   sodium chloride flush  3 mL Intravenous Q12H      LOS: 0 days   Cherene Altes, MD [facility] Office  [phone] Pager - Text Page per Amion  If 7PM-7AM, please contact night-coverage per Amion 08/27/2022, 10:02 AM

## 2022-08-28 DIAGNOSIS — G9340 Encephalopathy, unspecified: Secondary | ICD-10-CM | POA: Diagnosis not present

## 2022-08-28 MED ORDER — POLYVINYL ALCOHOL 1.4 % OP SOLN
1.0000 [drp] | OPHTHALMIC | Status: DC | PRN
  Administered 2022-08-28 (×2): 1 [drp] via OPHTHALMIC
  Filled 2022-08-28: qty 15

## 2022-08-28 NOTE — Progress Notes (Signed)
Mobility Specialist: Progress Note   08/28/22 1219  Mobility  Activity Ambulated with assistance in hallway  Level of Assistance Modified independent, requires aide device or extra time  Assistive Device Front wheel walker  Distance Ambulated (ft) 500 ft  Activity Response Tolerated well  Mobility Referral Yes  $Mobility charge 1 Mobility   Pt received in the bed and agreeable to mobility. Mod I with bed mobility and ambulation. Stopped x3 for standing breaks secondary to pts eyes bothering him, otherwise asymptomatic. Pt sitting EOB after session with call bell and phone at his side.   [place] Karsyn Jamie Mobility Specialist Please contact via SecureChat or Rehab office at [phone]

## 2022-08-28 NOTE — Progress Notes (Signed)
[facility] Progress Note  Subjective: seen in room, no new c/o  Vitals:   08/28/22 0315 08/28/22 0731 08/28/22 0826 08/28/22 1140  BP: 119/88 136/82  125/84  Pulse: 73 73  73  Resp: 16 18  16  $ Temp: 98.6 F (37 C) 98.5 F (36.9 C)  97.9 F (36.6 C)  TempSrc: Oral Oral  Oral  SpO2: 100% 100% 100% 100%  Weight:      Height:        Exam: Gen alert, no distress No jvd or bruits Chest clear bilat to bases RRR no MRG Abd soft ntnd no mass or ascites +bs Ext no LE edema Neuro is alert, Ox 3 , nf    LFA AVF+bruit    OP HD: TTS [facility]  4h   400/1.5  69kg  2/2 bath LFA AVF  Hep none - last HD 2/10 post wt 72.3kg - mircera 225 ug q2, last - venofer 50 weekly - rocaltrol 0.75 mcg po tiw - sensipar 90 mg po tiw  Assessment/ Plan: AMS/ gen'd weakness: Initial encephalopathy has resolved. Seen by PT.  No structural etiology noted and awaiting short-term SNF placement to continue rehabilitation. ESRD: on HD TTS. Had HD yesterday, next HD 2/20.  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     Kelly Splinter MD [facility] 08/28/2022, 3:21 PM  Recent Labs  Lab 08/23/22 0858 08/25/22 0609 08/26/22 0638  HGB  --  7.7* 7.9*  ALBUMIN 2.8* 2.7*  --   CALCIUM 8.7* 8.3*  --   PHOS 6.1* 3.4  --   CREATININE 13.33* 9.85*  --   K 4.6 5.0  --     No results for input(s): "IRON", "TIBC", "FERRITIN" in the last 168 hours. Inpatient medications:  aspirin  81 mg Oral Daily   atorvastatin  40 mg Oral Daily   calcitRIOL  0.75 mcg Oral Q T,Th,Sa-HD   carvedilol  6.25 mg Oral BID   Chlorhexidine Gluconate Cloth  6 each Topical Q0600   Chlorhexidine Gluconate Cloth  6 each Topical Q0600   cinacalcet  90 mg Oral Q T,Th,Sa-HD   darbepoetin (ARANESP) injection - DIALYSIS   150 mcg Subcutaneous Q Tue-1800   fluticasone furoate-vilanterol  1 puff Inhalation Daily   heparin  5,000 Units Subcutaneous Q8H   isosorbide mononitrate  15 mg Oral Daily   multivitamin  1 tablet Oral QHS   pantoprazole  40 mg Oral Daily   sevelamer carbonate  4,000 mg Oral TID WC   sodium chloride flush  3 mL Intravenous Q12H    acetaminophen, albuterol, labetalol, polyvinyl alcohol

## 2022-08-28 NOTE — Plan of Care (Signed)

## 2022-08-28 NOTE — Progress Notes (Signed)
Raymond [name]  [id number] DOB: 1955/10/14 DOA: 08/20/2022 PCP: [name], FNP    Brief Narrative:  67 year old with a history of HTN, polysubstance abuse, hepatitis C treated in 2014, paroxysmal atrial fibrillation not anticoagulated due to severe GI bleeding, chronic pancytopenia, and ESRD on HD who presented to the ER with altered mental status which developed during dialysis.  Consultants:  Nephrology  Goals of Care:  Code Status: Full Code   DVT prophylaxis: Subcutaneous heparin  Interim Hx: Awaiting insurance approval to allow for SNF placement.  Medically stable for d/c.  Afebrile.  Vital signs stable.  Assessment & Plan:  Acute encephalopathy due to hypotension/hypovolemia Became obtunded near end of outpatient dialysis session - CT head on arrival at ER unrevealing - alcohol level undetectable - TSH and free T4 normal - pCO2 normal - no significant electrolyte derangements - no clinical evidence of active infection - ammonia and B12 not abnormal - likely simply due to hypovolemia/volume shifting - no recurrence during hospital stay  ESRD on HD TTS Nephrology following and attending to HD - has been tolerating dialysis without difficulty as an inpatient  Ambulatory dysfunction/generalized weakness Will require SNF for rehab stay - medically cleared for d/c - awaiting insurance approval -felt to likely represent deconditioning exacerbated by episodic hypotension with no other clear etiology elucidated  HTN Adjusted medical tx to avoid episodic hypotension   CAD Continue aspirin, Lipitor, Imdur, and Coreg  Chronic paroxysmal atrial fibrillation Continue asa and Coreg - not anticoagulated due to history of severe GI bleeding  Chronic pancytopenia Stable  COPD Well compensated -continue Breo and as needed albuterol  Prolonged QTc Exercise care with medication choices  Family Communication: No family present at time of exam Disposition: From home - now  requires SNF for rehab stay - medically ready for d/c - awaiting insurance approval    Objective: Blood pressure 136/82, pulse 73, temperature 98.5 F (36.9 C), temperature source Oral, resp. rate 18, height 5' 6"$  (1.676 m), weight 72.6 kg, SpO2 100 %.  Intake/Output Summary (Last 24 hours) at 08/28/2022 1002 Last data filed at 08/28/2022 0100 Gross per 24 hour  Intake 150 ml  Output 1500 ml  Net -1350 ml    Filed Weights   08/25/22 1238 08/27/22 1944 08/27/22 2320  Weight: 69.3 kg 74.1 kg 72.6 kg    Examination: General: No acute respiratory distress Cardiovascular: Regular rate  Extremities: No signif edema   CBC: Recent Labs  Lab 08/23/22 0336 08/25/22 0609 08/26/22 0638  WBC 2.7* 2.8* 3.0*  HGB 7.8* 7.7* 7.9*  HCT 25.3* 25.1* 25.5*  MCV 97.3 98.8 98.8  PLT 131* 126* 120*    Basic Metabolic Panel: Recent Labs  Lab 08/22/22 0709 08/23/22 0336 08/23/22 0858 08/24/22 0318 08/25/22 0609  NA 134*  --  136  --  137  K 4.9  --  4.6  --  5.0  CL 94*  --  94*  --  98  CO2 29  --  27  --  24  GLUCOSE 79  --  140*  --  80  BUN 46*  --  56*  --  37*  CREATININE 11.09*  --  13.33*  --  9.85*  CALCIUM 8.5*  --  8.7*  --  8.3*  MG 2.1 2.1  --  1.8  --   PHOS  --   --  6.1*  --  3.4    GFR: Estimated Creatinine Clearance: 6.5 mL/min (A) (by  C-G formula based on SCr of 9.85 mg/dL (H)).   Scheduled Meds:  aspirin  81 mg Oral Daily   atorvastatin  40 mg Oral Daily   calcitRIOL  0.75 mcg Oral Q T,Th,Sa-HD   carvedilol  6.25 mg Oral BID   Chlorhexidine Gluconate Cloth  6 each Topical Q0600   Chlorhexidine Gluconate Cloth  6 each Topical Q0600   cinacalcet  90 mg Oral Q T,Th,Sa-HD   darbepoetin (ARANESP) injection - DIALYSIS  150 mcg Subcutaneous Q Tue-1800   fluticasone furoate-vilanterol  1 puff Inhalation Daily   heparin  5,000 Units Subcutaneous Q8H   isosorbide mononitrate  15 mg Oral Daily   multivitamin  1 tablet Oral QHS   pantoprazole  40 mg Oral Daily    sevelamer carbonate  4,000 mg Oral TID WC   sodium chloride flush  3 mL Intravenous Q12H      LOS: 0 days   Cherene Altes, MD [facility] Office  [phone] Pager - Text Page per Amion  If 7PM-7AM, please contact night-coverage per Amion 08/28/2022, 10:02 AM

## 2022-08-29 DIAGNOSIS — G9340 Encephalopathy, unspecified: Secondary | ICD-10-CM | POA: Diagnosis not present

## 2022-08-29 MED ORDER — DARBEPOETIN ALFA 150 MCG/0.3ML IJ SOSY: 150.0000 ug | PREFILLED_SYRINGE | INTRAMUSCULAR | Status: AC

## 2022-08-29 MED ORDER — ACETAMINOPHEN 325 MG PO TABS: 650.0000 mg | ORAL_TABLET | Freq: Four times a day (QID) | ORAL | Status: AC | PRN

## 2022-08-29 MED ORDER — CARVEDILOL 6.25 MG PO TABS: 6.2500 mg | ORAL_TABLET | Freq: Two times a day (BID) | ORAL | Status: AC

## 2022-08-29 MED ORDER — CHLORHEXIDINE GLUCONATE CLOTH 2 % EX PADS
6.0000 | MEDICATED_PAD | Freq: Every day | CUTANEOUS | Status: DC
  Administered 2022-08-29: 6 via TOPICAL

## 2022-08-29 MED ORDER — CINACALCET HCL 30 MG PO TABS: 90.0000 mg | ORAL_TABLET | ORAL | Status: AC

## 2022-08-29 MED ORDER — CALCITRIOL 0.25 MCG PO CAPS: 0.7500 ug | ORAL_CAPSULE | ORAL | Status: AC

## 2022-08-29 NOTE — Progress Notes (Signed)
Physical Therapy Treatment Patient Details Name: Raymond [name] MRN: [id number] DOB: 11/15/1955 Today's Date: 08/29/2022   History of Present Illness 67 yo male presented to  to the emergency department with altered mental status. Work up demonstrates Acute encephalopathy, Concern for hypovolemia/hypotension, and ambulatory dysfunction. PMHx: hypertension, polysubstance abuse, hepatitis C treated in 2014, ESRD on hemodialysis, paroxysmal atrial fibrillation not anticoagulated due to severe GI bleeding, and chronic pancytopenia.    PT Comments    Focus of session on static and dynamic balance. Pt with steady improvement; no ataxia noted this session. Pt ambulating hallway distances with a cane at a supervision level. Gait speed of 1.78 ft/s and DGI score of 16/24 indicative of pt still at high fall risk. Pt goals to return to independence with IADL's. Continue to recommend SNF for ongoing Physical Therapy.      Recommendations for follow up therapy are one component of a multi-disciplinary discharge planning process, led by the attending physician.  Recommendations may be updated based on patient status, additional functional criteria and insurance authorization.  Follow Up Recommendations  Skilled nursing-short term rehab (<3 hours/day) Can patient physically be transported by private vehicle: Yes   Assistance Recommended at Discharge Frequent or constant Supervision/Assistance  Patient can return home with the following Assistance with cooking/housework;Assist for transportation;Help with stairs or ramp for entrance   Equipment Recommendations  None recommended by PT    Recommendations for Other Services Other (comment) (mobility specialist team referral)     Precautions / Restrictions Precautions Precautions: Fall Restrictions Weight Bearing Restrictions: No     Mobility  Bed Mobility Overal bed mobility: Modified Independent                  Transfers Overall transfer  level: Needs assistance Equipment used: Straight cane Transfers: Sit to/from Stand Sit to Stand: Supervision                Ambulation/Gait Ambulation/Gait assistance: Supervision Gait Distance (Feet): 350 Feet Assistive device: Straight cane Gait Pattern/deviations: Step-through pattern, Decreased stride length Gait velocity: 1.78 ft/s Gait velocity interpretation: <1.8 ft/sec, indicate of risk for recurrent falls   General Gait Details: Slow and steady pace, supervision for safety. No ataxia or overt LOB   Stairs             Wheelchair Mobility    Modified Rankin (Stroke Patients Only)       Balance Overall balance assessment: Needs assistance Sitting-balance support: Feet supported Sitting balance-Leahy Scale: Good     Standing balance support: During functional activity, No upper extremity supported Standing balance-Leahy Scale: Fair Standing balance comment: see DGI                 Standardized Balance Assessment Standardized Balance Assessment : Dynamic Gait Index   Dynamic Gait Index Level Surface: Mild Impairment Change in Gait Speed: Mild Impairment Gait with Horizontal Head Turns: Normal Gait with Vertical Head Turns: Normal Gait and Pivot Turn: Mild Impairment Step Over Obstacle: Mild Impairment Step Around Obstacles: Moderate Impairment Steps: Moderate Impairment Total Score: 16      Cognition Arousal/Alertness: Awake/alert Behavior During Therapy: WFL for tasks assessed/performed Overall Cognitive Status: No family/caregiver present to determine baseline cognitive functioning                                 General Comments: Overall appropriate and following all commands, mild decreased insight into his deficits  Exercises Other Exercises Other Exercises: Static/dynamic standing balance: Romberg, ball toss x 20, functional reaching in all planes    General Comments        Pertinent Vitals/Pain       Home Living                          Prior Function            PT Goals (current goals can now be found in the care plan section) Acute Rehab PT Goals Patient Stated Goal: back to baseline PT Goal Formulation: With patient Time For Goal Achievement: 09/05/22 Potential to Achieve Goals: Good Progress towards PT goals: Progressing toward goals    Frequency    Min 3X/week      PT Plan Current plan remains appropriate    Co-evaluation              AM-PAC PT "6 Clicks" Mobility   Outcome Measure  Help needed turning from your back to your side while in a flat bed without using bedrails?: None Help needed moving from lying on your back to sitting on the side of a flat bed without using bedrails?: None Help needed moving to and from a bed to a chair (including a wheelchair)?: A Little Help needed standing up from a chair using your arms (e.g., wheelchair or bedside chair)?: A Little Help needed to walk in hospital room?: A Little Help needed climbing 3-5 steps with a railing? : A Little 6 Click Score: 20    End of Session Equipment Utilized During Treatment: Gait belt Activity Tolerance: Patient tolerated treatment well Patient left: in chair;with call bell/phone within reach;with chair alarm set Nurse Communication: Mobility status PT Visit Diagnosis: Unsteadiness on feet (R26.81);Ataxic gait (R26.0)     Time: [id number] PT Time Calculation (min) (ACUTE ONLY): 12 min  Charges:  $Therapeutic Activity: 8-22 mins                     Wyona Almas, PT, DPT Acute Rehabilitation Services Office [phone]    Raymond [name] 08/29/2022, 4:11 PM

## 2022-08-29 NOTE — Progress Notes (Signed)
Mobility Specialist Progress Note   08/29/22 1100  Mobility  Activity Ambulated with assistance in hallway  Level of Assistance Contact guard assist, steadying assist  Assistive Device Front wheel walker  Distance Ambulated (ft) 500 ft  Range of Motion/Exercises Active;All extremities  Activity Response Tolerated well   Patient received in supine and agreeable to participate. Ambulated min guard with slow steady gait. Returned to room without complaint or incident. Was left sitting at sink for personal care with all needs met, call bell in reach. NT notified.  Raymond [name], BS EXP Mobility Specialist Please contact via SecureChat or Rehab office at [phone]

## 2022-08-29 NOTE — Discharge Summary (Signed)
DISCHARGE SUMMARY  DOHN WEIDERT  MR#: [id number]  DOB:1956-05-25  Date of Admission: 08/20/2022 Date of Discharge: 08/29/2022  Attending Physician:Jaquel Glassburn Hennie Duos, MD  Patient's [id number], Raymond [name]., FNP  Consults: Nephrology  Disposition: D/C to SNF for rehab stay   Follow-up Appts:  Contact information for after-discharge care     Destination     [facility] Preferred SNF .   Service: Skilled Nursing Contact information: [address] [place] [phone]                     Discharge Diagnoses: Acute encephalopathy due to hypotension/hypovolemia ESRD on HD TTS Ambulatory dysfunction/generalized weakness HTN CAD Chronic paroxysmal atrial fibrillation Chronic pancytopenia COPD Prolonged QTc:  Initial presentation: 67 year old with a history of HTN, polysubstance abuse, hepatitis C treated in 2014, paroxysmal atrial fibrillation not anticoagulated due to severe GI bleeding, chronic pancytopenia, and ESRD on HD who presented to the ER with altered mental status which developed during dialysis.   Hospital Course:  Acute encephalopathy due to hypotension/hypovolemia Became obtunded near end of outpatient dialysis session - CT head on arrival at ER unrevealing - alcohol level undetectable - TSH and free T4 normal - pCO2 normal - no significant electrolyte derangements - no clinical evidence of active infection - ammonia and B12 not abnormal - likely simply due to hypovolemia/volume shifting - no recurrence during hospital stay   ESRD on HD TTS Nephrology followed and attended to HD - has been tolerating dialysis without difficulty as an inpatient   Ambulatory dysfunction/generalized weakness Will require SNF rehab stay - medically cleared for d/c - felt to likely represent deconditioning exacerbated by episodic hypotension with no other clear etiology elucidated   HTN Adjusted medical tx to avoid episodic  hypotension    CAD Continue aspirin, Lipitor, Imdur, and Coreg   Chronic paroxysmal atrial fibrillation Continue asa and Coreg - not anticoagulated due to history of severe GI bleeding   Chronic pancytopenia Stable   COPD Well compensated -continue Breo and as needed albuterol   Prolonged QTc Exercise care with medication choices  Allergies as of 08/29/2022   No Known Allergies      Medication List     STOP taking these medications    hydrALAZINE 10 MG tablet Commonly known as: APRESOLINE   pantoprazole 40 MG tablet Commonly known as: PROTONIX   sertraline 25 MG tablet Commonly known as: ZOLOFT       TAKE these medications    acetaminophen 325 MG tablet Commonly known as: TYLENOL Take 2 tablets (650 mg total) by mouth every 6 (six) hours as needed for mild pain, fever or headache.   albuterol 108 (90 Base) MCG/ACT inhaler Commonly known as: VENTOLIN HFA Inhale 3 puffs into the lungs daily as needed for shortness of breath or wheezing.   aspirin 81 MG chewable tablet Chew 1 tablet (81 mg total) by mouth daily.   atorvastatin 40 MG tablet Commonly known as: LIPITOR Take 1 tablet (40 mg total) by mouth daily.   Breo Ellipta 200-25 MCG/ACT Aepb Generic drug: fluticasone furoate-vilanterol Inhale 2 puffs into the lungs daily as needed (for shortness of breath).   calcitRIOL 0.25 MCG capsule Commonly known as: ROCALTROL Take 3 capsules (0.75 mcg total) by mouth Every Tuesday,Thursday,and Saturday with dialysis. Start taking on: August 30, 2022 What changed:  medication strength how much to take   carvedilol 6.25 MG tablet Commonly known as: COREG Take 1 tablet (6.25 mg total)  by mouth 2 (two) times daily. What changed:  medication strength See the new instructions.   cinacalcet 30 MG tablet Commonly known as: SENSIPAR Take 3 tablets (90 mg total) by mouth Every Tuesday,Thursday,and Saturday with dialysis. Start taking on: August 30, 2022 What changed: how much to take   Darbepoetin Alfa 150 MCG/0.3ML Sosy injection Commonly known as: ARANESP Inject 0.3 mLs (150 mcg total) into the skin every Tuesday at 6 PM. Start taking on: August 30, 2022 What changed:  medication strength how much to take how to take this when to take this   isosorbide mononitrate 30 MG 24 hr tablet Commonly known as: IMDUR Take 1/2 tablet (15 mg total) by mouth daily. What changed: how much to take   multivitamin Tabs tablet Take 1 tablet by mouth at bedtime.   sevelamer carbonate 800 MG tablet Commonly known as: RENVELA Take 5 tablets (4,000 mg total) by mouth 3 (three) times daily with meals.        Day of Discharge BP 125/77 (BP Location: Right Arm)   Pulse 82   Temp 98.3 F (36.8 C) (Oral)   Resp 18   Ht 5' 6"$  (1.676 m)   Wt 72.6 kg   SpO2 99%   BMI 25.83 kg/m   Physical Exam: General: No acute respiratory distress Lungs: Clear to auscultation bilaterally  Cardiovascular: Regular rate  Abdomen: Nontender, nondistended, soft, bowel sounds positive Extremities: No significant edema bilateral lower extremities  Basic Metabolic Panel: Recent Labs  Lab 08/23/22 0336 08/23/22 0858 08/24/22 0318 08/25/22 0609  NA  --  136  --  137  K  --  4.6  --  5.0  CL  --  94*  --  98  CO2  --  27  --  24  GLUCOSE  --  140*  --  80  BUN  --  56*  --  37*  CREATININE  --  13.33*  --  9.85*  CALCIUM  --  8.7*  --  8.3*  MG 2.1  --  1.8  --   PHOS  --  6.1*  --  3.4   CBC: Recent Labs  Lab 08/23/22 0336 08/25/22 0609 08/26/22 0638  WBC 2.7* 2.8* 3.0*  HGB 7.8* 7.7* 7.9*  HCT 25.3* 25.1* 25.5*  MCV 97.3 98.8 98.8  PLT 131* 126* 120*    Time spent in discharge (includes decision making & examination of pt): 35 minutes  08/29/2022, 2:32 PM   Cherene Altes, MD [facility] Office  [phone]

## 2022-08-29 NOTE — Progress Notes (Signed)
[facility] Progress Note  Subjective: seen in room, no new c/o  Vitals:   08/28/22 1634 08/28/22 2007 08/29/22 0312 08/29/22 0759  BP: 115/78 119/72 (!) 155/88 (!) 148/92  Pulse: 76 70 75 89  Resp: 18 16 17 18  $ Temp: 98.6 F (37 C) 98.4 F (36.9 C) 98.8 F (37.1 C) 98.4 F (36.9 C)  TempSrc: Oral Oral Oral Oral  SpO2: 99%  100% 100%  Weight:      Height:        Exam: Gen alert, no distress No jvd or bruits Chest clear bilat to bases RRR no MRG Abd soft ntnd no mass or ascites +bs Ext no LE edema Neuro is alert, Ox 3 , nf    LFA AVF+bruit    OP HD: TTS [facility]  4h   400/1.5  69kg  2/2 bath LFA AVF  Hep none - last HD 2/10 post wt 72.3kg - mircera 225 ug q2, last - venofer 50 weekly - rocaltrol 0.75 mcg po tiw - sensipar 90 mg po tiw  Assessment/ Plan: AMS/ gen'd weakness: Initial encephalopathy has resolved. Seen by PT.  No structural etiology noted and awaiting short-term SNF placement to continue rehabilitation. ESRD: on HD TTS. Next HD tomorrow.  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 Dispo: awaiting SNF placement      Rob Lonza Shimabukuro MD [facility] 08/29/2022, 11:13 AM  Recent Labs  Lab 08/23/22 0858 08/25/22 0609 08/26/22 0638  HGB  --  7.7* 7.9*  ALBUMIN 2.8* 2.7*  --   CALCIUM 8.7* 8.3*  --   PHOS 6.1* 3.4  --   CREATININE 13.33* 9.85*  --   K 4.6 5.0  --     No results for input(s): "IRON", "TIBC", "FERRITIN" in the last 168 hours. Inpatient medications:  aspirin  81 mg Oral Daily   atorvastatin  40 mg Oral Daily   calcitRIOL  0.75 mcg Oral Q T,Th,Sa-HD   carvedilol  6.25 mg Oral BID   Chlorhexidine Gluconate Cloth  6 each Topical Q0600   Chlorhexidine Gluconate Cloth  6 each Topical Q0600   cinacalcet  90 mg Oral Q  T,Th,Sa-HD   darbepoetin (ARANESP) injection - DIALYSIS  150 mcg Subcutaneous Q Tue-1800   fluticasone furoate-vilanterol  1 puff Inhalation Daily   heparin  5,000 Units Subcutaneous Q8H   isosorbide mononitrate  15 mg Oral Daily   multivitamin  1 tablet Oral QHS   pantoprazole  40 mg Oral Daily   sevelamer carbonate  4,000 mg Oral TID WC   sodium chloride flush  3 mL Intravenous Q12H    acetaminophen, albuterol, labetalol, polyvinyl alcohol

## 2022-08-29 NOTE — TOC Transition Note (Signed)
Transition of Care [facility]) - CM/SW Discharge Note   Patient Details  Name: Raymond [name] MRN: [id number] Date of Birth: [birth year]  Transition of Care [facility]) CM/SW Contact:  Geralynn Ochs, LCSW Phone Number: 08/29/2022, 3:07 PM   Clinical Narrative:   CSW received insurance approval for patient to admit to [facility]. CSW sent discharge information and confirmed bed availability. CSW updated patient, he is in agreement. Patient has no family to provide transportation, scheduled pickup with [facility] for next available.   Nurse to call report to [phone].    Final next level of care: Skilled Nursing Facility Barriers to Discharge: Barriers Resolved   Patient Goals and CMS Choice CMS Medicare.gov Compare Post Acute Care list provided to:: Patient Choice offered to / list presented to : Patient  Discharge Placement                Patient chooses bed at: [facility] Patient to be transferred to facility by: R[facility]ie Name of family member notified: Self Patient and family notified of of transfer: 08/29/22  Discharge Plan and Services Additional resources added to the After Visit Summary for       Post Acute Care Choice: [place]                               Social Determinants of Health (SDOH) Interventions SDOH Screenings   Food Insecurity: No Food Insecurity (12/29/2021)  Housing: Low Risk  (12/29/2021)  Transportation Needs: Unmet Transportation Needs (07/24/2022)  Financial Resource Strain: Low Risk  (12/29/2021)  Social Connections: Unknown (08/18/2018)  Tobacco Use: High Risk (08/20/2022)     Readmission Risk Interventions    07/07/2022    1:00 PM 12/01/2021   10:21 AM 11/26/2021    5:07 PM  Readmission Risk Prevention Plan  Transportation Screening Complete  Complete  Medication Review Press photographer) Complete  Complete  PCP or Specialist appointment within 3-5 days of discharge Complete Complete   HRI or [place] Complete Complete   SW Recovery Care/Counseling Consult Complete  Complete  Palliative Care Screening Not Applicable  Not [place] Not Applicable Not Applicable

## 2022-08-30 NOTE — Progress Notes (Signed)
Late Entry Note  Pt d/c to snf yesterday. Contacted [facility] to advise clinic of pt's d/c to snf and that pt should resume care today. Clinic advised navigator that they were contacted yesterday with this info (this navigator on PTO 2/19). Clinic prepared to treat pt today.   Melven Sartorius Renal Navigator [phone]

## 2022-09-08 NOTE — Progress Notes (Deleted)
Cardiology Office Note   Date:  09/08/2022   ID:  Raymond [name], DOB 11-04-1955, MRN [id number]  PCP:  [name], FNP  Cardiologist:   Jenkins Rouge, MD Referring:  ***  No chief complaint on file.     History of Present Illness: Raymond [name] is a 67 y.o. male who presents for ***    PMH of COPD, end-stage renal disease, HTN, substance abuse, thyroid disease, type 2 diabetes, mild CAD, and hypertensive heart disease.  He had a PE a arrest and was noted to be hypoglycemic during his 04/03/2021 admission.  Cardiology followed and it was noted that junctional rhythm.  His arrhythmia resolved.  His metoprolol 50 mg twice daily was continued.    He was admitted to the hospital 12/20/2021 and discharged on 12/22/2021.  He was noted to have hypovolemia associated with renal insufficiency.  He presented to the hospital with shortness of breath.  He indicated that his shortness of breath started the night before.  He was not active.  He dozed off to sleep however was unable to stay asleep due to his shortness of breath.  He underwent hemodialysis on 12/18/2021 which was normal.  His oxygen saturation decreased to 72 and he was started on oxygen.  He completed a 3-hour treatment.  In the emergency department it was felt that his DOE was related to CHF.  His BNP was 2400.  His troponins were mildly chronically elevated and his potassium was 7.5.  He received HD.  It was felt that his dyspnea and fluid volume overload was secondary to incomplete hemodialysis session which led to fluid volume overload.  He was discharged in stable condition on 12/22/2021 and was instructed to receive HD as scheduled on 12/23/2028.  He presents to the clinic today for follow-up evaluation and states he feels fairly well today.  We reviewed his recent hospitalization and he expressed understanding.  His heart rate today is 112 bpm.  He does report occasional episodes of irregular heartbeat.  We reviewed his paroxysmal  atrial fibrillation.  His blood pressure is well controlled today at 122/88.  We reviewed the importance of compliance with hemodialysis.  He reports that he is now receiving 3-1/2-hour treatments.  He has and eye exam scheduled for August.  He reports that during dialysis his vision is affected.  It appears he may have cataracts.  He has contacted the optometrist to see if he may obtain a sooner appointment.  I will increase his carvedilol to 25 mg in the morning and 12 and half milligrams in the afternoon.  We will have him avoid triggers for palpitations, maintain his physical activity, and plan follow-up in 3 to 4 months.  Today he denies chest pain, shortness of breath, lower extremity edema, fatigue,  melena, hematuria, hemoptysis, diaphoresis, weakness, presyncope, syncope, orthopnea, and PND.     Past Medical History:  Diagnosis Date   Anemia    CAD (coronary artery disease)    a. 03/2018: cath showing 67 to 30% stenosis along the LAD, luminal irregularities along the RCA, and angiographically normal LCx   COPD (chronic obstructive pulmonary disease) (HCC)    ESRD (end stage renal disease) on dialysis [facility])    "MWF; [facility]" (07/22/17)   Hepatitis C    Still positive s/p liver biopsy at [facility]  and interferon therapy for 6 months. Most recent lab work was on 10/24/12   Hepatitis C    "took the tx; gone now" (  12/05/2016)  Was treated   History of blood transfusion ~ 2012/2013   "related to my kidneys; blood was low"   Hypertension    Peripheral arterial disease ([place])    Substance abuse ([place])    Thyroid disease    Type 2 diabetes mellitus with other diabetic kidney complication ([place]) [id number]    Past Surgical History:  Procedure Laterality Date   ABDOMINAL AORTOGRAM W/LOWER EXTREMITY N/A 02/08/2018   Procedure: ABDOMINAL AORTOGRAM W/LOWER EXTREMITY;  Surgeon: Conrad Lindsay, MD;  Location: [place] CV LAB;  Service: Cardiovascular;  Laterality: N/A;   ABDOMINAL AORTOGRAM  W/LOWER EXTREMITY Bilateral 06/15/2020   Procedure: ABDOMINAL AORTOGRAM W/LOWER EXTREMITY;  Surgeon: Waynetta Sandy, MD;  Location: [place] CV LAB;  Service: Cardiovascular;  Laterality: Bilateral;   AV FISTULA PLACEMENT Left Aug. 2013 ?   BIOPSY  01/17/2020   Procedure: BIOPSY;  Surgeon: Jackquline Denmark, MD;  Location: [place];  Service: Endoscopy;;   COLONOSCOPY WITH PROPOFOL N/A 08/21/2018   Procedure: COLONOSCOPY WITH PROPOFOL;  Surgeon: Yetta Flock, MD;  Location: [place];  Service: Gastroenterology;  Laterality: N/A;   ENTEROSCOPY N/A 01/17/2020   Procedure: ENTEROSCOPY;  Surgeon: Jackquline Denmark, MD;  Location: [facility] ENDOSCOPY;  Service: Endoscopy;  Laterality: N/A;   ENTEROSCOPY N/A 07/31/2020   Procedure: ENTEROSCOPY;  Surgeon: Doran Stabler, MD;  Location: [place];  Service: Gastroenterology;  Laterality: N/A;   ENTEROSCOPY N/A 01/27/2021   Procedure: ENTEROSCOPY;  Surgeon: Yetta Flock, MD;  Location: [facility] ENDOSCOPY;  Service: Gastroenterology;  Laterality: N/A;   ENTEROSCOPY N/A 07/22/2022   Procedure: ENTEROSCOPY;  Surgeon: Doran Stabler, MD;  Location: [facility] ENDOSCOPY;  Service: Gastroenterology;  Laterality: N/A;   ESOPHAGOGASTRODUODENOSCOPY (EGD) WITH PROPOFOL N/A 08/20/2018   Procedure: ESOPHAGOGASTRODUODENOSCOPY (EGD) WITH PROPOFOL;  Surgeon: Gatha Mayer, MD;  Location: [place];  Service: Endoscopy;  Laterality: N/A;   FEMORAL-POPLITEAL BYPASS GRAFT Left 04/11/2018   Procedure: BYPASS GRAFT FEMORAL-POPLITEAL ARTERY LEFT LEG;  Surgeon: Rosetta Posner, MD;  Location: [facility] OR;  Service: Vascular;  Laterality: Left;   GIVENS CAPSULE STUDY N/A 07/31/2020   Procedure: GIVENS CAPSULE STUDY;  Surgeon: Doran Stabler, MD;  Location: [place];  Service: Gastroenterology;  Laterality: N/A;   HOT HEMOSTASIS N/A 08/20/2018   Procedure: HOT HEMOSTASIS (ARGON PLASMA COAGULATION/BICAP);  Surgeon: Gatha Mayer, MD;  Location: [facility] ENDOSCOPY;   Service: Endoscopy;  Laterality: N/A;   HOT HEMOSTASIS N/A 01/17/2020   Procedure: HOT HEMOSTASIS (ARGON PLASMA COAGULATION/BICAP);  Surgeon: Jackquline Denmark, MD;  Location: [facility] ENDOSCOPY;  Service: Endoscopy;  Laterality: N/A;   HOT HEMOSTASIS N/A 01/27/2021   Procedure: HOT HEMOSTASIS (ARGON PLASMA COAGULATION/BICAP);  Surgeon: Yetta Flock, MD;  Location: [facility] ENDOSCOPY;  Service: Gastroenterology;  Laterality: N/A;   HOT HEMOSTASIS N/A 07/22/2022   Procedure: HOT HEMOSTASIS (ARGON PLASMA COAGULATION/BICAP);  Surgeon: Doran Stabler, MD;  Location: [place];  Service: Gastroenterology;  Laterality: N/A;   LEFT HEART CATH AND CORONARY ANGIOGRAPHY N/A 03/29/2018   Procedure: LEFT HEART CATH AND CORONARY ANGIOGRAPHY;  Surgeon: Nelva Bush, MD;  Location: [place] CV LAB;  Service: Cardiovascular;  Laterality: N/A;   LIVER BIOPSY     ORIF ULNAR FRACTURE Left 05/18/2017   Procedure: OPEN REDUCTION INTERNAL FIXATION (ORIF) ULNAR FRACTURE;  Surgeon: Iran Planas, MD;  Location: [place];  Service: Orthopedics;  Laterality: Left;   PERIPHERAL VASCULAR INTERVENTION Right 06/15/2020   Procedure: PERIPHERAL VASCULAR INTERVENTION;  Surgeon: Waynetta Sandy, MD;  Location: [place] CV LAB;  Service: Cardiovascular;  Laterality: Right;   POLYPECTOMY  08/21/2018   Procedure: POLYPECTOMY;  Surgeon: Yetta Flock, MD;  Location: [place];  Service: Gastroenterology;;   Earney Mallet N/A 12/11/2012   Procedure: Earney Mallet;  Surgeon: Serafina Mitchell, MD;  Location: [facility] CATH LAB;  Service: Cardiovascular;  Laterality: N/A;   WOUND EXPLORATION Left 06/15/2020   Procedure: GROIN  EXPLORATION;  Surgeon: Waynetta Sandy, MD;  Location: [facility] OR;  Service: Vascular;  Laterality: Left;     Current Outpatient Medications  Medication Sig Dispense Refill   acetaminophen (TYLENOL) 325 MG tablet Take 2 tablets (650 mg total) by mouth every 6 (six) hours as needed for mild pain, fever or  headache.     albuterol (VENTOLIN HFA) 108 (90 Base) MCG/ACT inhaler Inhale 3 puffs into the lungs daily as needed for shortness of breath or wheezing.     aspirin 81 MG chewable tablet Chew 1 tablet (81 mg total) by mouth daily. 30 tablet 2   atorvastatin (LIPITOR) 40 MG tablet Take 1 tablet (40 mg total) by mouth daily. 30 tablet 2   BREO ELLIPTA 200-25 MCG/ACT AEPB Inhale 2 puffs into the lungs daily as needed (for shortness of breath).     calcitRIOL (ROCALTROL) 0.25 MCG capsule Take 3 capsules (0.75 mcg total) by mouth Every Tuesday,Thursday,and Saturday with dialysis.     carvedilol (COREG) 6.25 MG tablet Take 1 tablet (6.25 mg total) by mouth 2 (two) times daily.     cinacalcet (SENSIPAR) 30 MG tablet Take 3 tablets (90 mg total) by mouth Every Tuesday,Thursday,and Saturday with dialysis. 60 tablet    Darbepoetin Alfa (ARANESP) 150 MCG/0.3ML SOSY injection Inject 0.3 mLs (150 mcg total) into the skin every Tuesday at 6 PM. 1.68 mL    isosorbide mononitrate (IMDUR) 30 MG 24 hr tablet Take 1/2 tablet (15 mg total) by mouth daily. (Patient taking differently: Take 30 mg by mouth daily.) 15 tablet 2   multivitamin (RENA-VIT) TABS tablet Take 1 tablet by mouth at bedtime.  0   sevelamer carbonate (RENVELA) 800 MG tablet Take 5 tablets (4,000 mg total) by mouth 3 (three) times daily with meals.     No current facility-administered medications for this visit.   Facility-Administered Medications Ordered in Other Visits  Medication Dose Route Frequency Provider Last Rate Last Admin   midazolam (VERSED) 5 MG/5ML injection    Anesthesia Intra-op Sammie Bench, CRNA   2 mg at 04/11/18 1042    Allergies:   Patient has no known allergies.    Social History:  The patient  reports that he has been smoking cigarettes. He has a 25.00 pack-year smoking history. He has never used smokeless tobacco. He reports current alcohol use of about 10.0 standard drinks of alcohol per week. He reports that he  does not currently use drugs after having used the following drugs: Cocaine.   Family History:  The patient's ***family history includes Diabetes in his father; Heart disease in his father; Hypertension in his father.    ROS:  Please see the history of present illness.   Otherwise, review of systems are positive for {NONE DEFAULTED:18576}.   All other systems are reviewed and negative.    PHYSICAL EXAM: VS:  There were no vitals taken for this visit. , BMI There is no height or weight on file to calculate BMI. GENERAL:  Well appearing HEENT:  Pupils equal round and reactive, fundi not visualized, oral mucosa unremarkable NECK:  No jugular venous distention, waveform within normal limits, carotid upstroke brisk and symmetric, no bruits, no thyromegaly LYMPHATICS:  No cervical, inguinal adenopathy LUNGS:  Clear to auscultation bilaterally BACK:  No CVA tenderness CHEST:  Unremarkable HEART:  PMI not displaced or sustained,S1 and S2 within normal limits, no S3, no S4, no clicks, no rubs, *** murmurs ABD:  Flat, positive bowel sounds normal in frequency in pitch, no bruits, no rebound, no guarding, no midline pulsatile mass, no hepatomegaly, no splenomegaly EXT:  2 plus pulses throughout, no edema, no cyanosis no clubbing SKIN:  No rashes no nodules NEURO:  Cranial nerves II through XII grossly intact, motor grossly intact throughout PSYCH:  Cognitively intact, oriented to person place and time    EKG:  EKG {ACTION; IS/IS [id number] ordered today. The ekg ordered today demonstrates ***   Recent Labs: 07/30/2022: B Natriuretic Peptide 1,497.0 08/20/2022: ALT 9; TSH 3.018 08/24/2022: Magnesium 1.8 08/25/2022: BUN 37; Creatinine, Ser 9.85; Potassium 5.0; Sodium 137 08/26/2022: Hemoglobin 7.9; Platelets 120    Lipid Panel    Component Value Date/Time   CHOL 158 03/27/2018 1420   TRIG 649 (H) 04/04/2021 0500   HDL 74 03/27/2018 1420   CHOLHDL 2.1 03/27/2018 1420   CHOLHDL 2.0 Ratio  10/04/2007 2023   VLDL 31 10/04/2007 2023   LDLCALC 69 03/27/2018 1420      Wt Readings from Last 3 Encounters:  08/27/22 160 lb 0.9 oz (72.6 kg)  07/21/22 141 lb 1.5 oz (64 kg)  07/07/22 138 lb 1.6 oz (62.6 kg)      Other studies Reviewed: Additional studies/ records that were reviewed today include: ***. Review of the above records demonstrates:  Please see elsewhere in the note.  ***   ASSESSMENT AND PLAN:  Abnormal EKG with new anterolateral T wave inversions:  ***  -Very pronounced EKG changes.  Echo shows LVEF is relatively preserved at 50% with no wall motion abnormality.  He reports no chest pain.  There are no findings to suggest a stress-induced cardiomyopathy. -Echo does not show apical variant hypertrophic cardiomyopathy but does show asymmetric LVH.  This is not really new.  He is EKG has changed. -Do not suspect coronary ischemia given preserved EF with no wall motion normality. -Discussed case with primary rounding team.  These could represent cerebral T waves or possible stroke.  He has no symptoms to suggest an acute stroke.  Brain MRI will be ordered by the primary team. -Left heart cath in 2019 showed nonobstructive CAD. -Would recommend just medical management for now.  Follow-up results of brain MRI.   Persistent atrial fibrillation/flutter:  ***  -Not on anticoagulation due to history of severe GI bleed. -Continue to hold Eliquis. -Rate controlled on carvedilol 25 mg twice daily.  No need for amiodarone.  This appears to be permanent atrial fibrillation.   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 -No focal deficits.  Left heart cath in 2019 unremarkable. -Continue with medical management.  Coreg 25 mg twice daily.  I have added hydralazine 10 mg 3 times daily and Imdur 15 mg daily. -No ACE/ARB/ARNI/MRA given ESRD.  We will arrange outpatient follow-up.   Substance abuse:  ***   -Advised to refrain from alcohol and cocaine.   Current medicines are reviewed at length with the patient today.  The patient {ACTIONS; HAS/DOES NOT HAVE:19233} concerns regarding medicines.  The following changes have been made:  {PLAN; NO CHANGE:13088:s}  Labs/ tests ordered today include: *** No orders of the defined types were placed in this encounter.    Disposition:   FU with ***    Signed, Minus Breeding, MD  09/08/2022 7:11 PM    [place]

## 2022-09-09 ENCOUNTER — Ambulatory Visit: Payer: Medicare HMO | Attending: Cardiology | Admitting: Cardiology

## 2022-10-12 ENCOUNTER — Telehealth: Payer: Self-pay | Admitting: Cardiovascular Disease

## 2022-10-12 NOTE — Telephone Encounter (Signed)
*  STAT* If patient is at the pharmacy, call can be transferred to refill team.   1. Which medications need to be refilled? (please list name of each medication and dose if known) atorvastatin (LIPITOR) 40 MG tablet (Expired) isosorbide mononitrate (IMDUR) 30 MG 24 hr tablet (Expired)  2. Which pharmacy/location (including street and city if local pharmacy) is medication to be sent to? [facility] [place] [place] [address] [facility].   3. Do they need a 30 day or 90 day supply? [place]

## 2022-10-13 MED ORDER — ATORVASTATIN CALCIUM 40 MG PO TABS: 40.0000 mg | ORAL_TABLET | Freq: Every day | ORAL | 2 refills | Status: AC

## 2022-10-13 MED ORDER — ISOSORBIDE MONONITRATE ER 30 MG PO TB24: 15.0000 mg | ORAL_TABLET | Freq: Every day | ORAL | 2 refills | Status: AC

## 2022-10-13 NOTE — Addendum Note (Signed)
Addended by: Venetia Maxon on: 10/13/2022 09:15 AM   Modules accepted: Orders

## 2022-10-13 NOTE — Telephone Encounter (Signed)
Refills has been sent to the pharmacy. 

## 2022-10-21 ENCOUNTER — Ambulatory Visit: Payer: Medicare HMO | Admitting: Cardiology

## 2022-11-17 NOTE — Progress Notes (Deleted)
Cardiology Office Note:   Date:  11/17/2022  NAME:  Raymond [name]    MRN: [id number] DOB:  04/27/1956   PCP:  [name], FNP  Cardiologist:  None  Electrophysiologist:  None   Referring MD: Ernie Avena, MD   No chief complaint on file. ***  History of Present Illness:   Raymond [name] is a 67 y.o. male with a hx of HTN, ESRD, non-obstructive CAD, PAD, Afib/flutter (no AC due to GI bleed), systolic HF who presents for follow-up.   Problem List ESRD  Systolic HF -EF 45-50% 07/07/2022 3.Non-obstructive CAD -LHC 03/29/2018 4. Persistent Afib/flutter -no AC due to GI bleed  5. PAD -L fem-pop bypass 04/11/2018 -R iliac stent 06/15/2020 6. HTN  Past Medical History: Past Medical History:  Diagnosis Date   Anemia    CAD (coronary artery disease)    a. 03/2018: cath showing 20 to 30% stenosis along the LAD, luminal irregularities along the RCA, and angiographically normal LCx   COPD (chronic obstructive pulmonary disease) (HCC)    ESRD (end stage renal disease) on dialysis (HCC)    "MWF; [facility]" (07/22/17)   Hepatitis C    Still positive s/p liver biopsy at [facility]  and interferon therapy for 6 months. Most recent lab work was on 10/24/12   Hepatitis C    "took the tx; gone now" (12/05/2016)  Was treated   History of blood transfusion ~ 2012/2013   "related to my kidneys; blood was low"   Hypertension    Peripheral arterial disease (HCC)    Substance abuse (HCC)    Thyroid disease    Type 2 diabetes mellitus with other diabetic kidney complication (HCC) 09/24/2018    Past Surgical History: Past Surgical History:  Procedure Laterality Date   ABDOMINAL AORTOGRAM W/LOWER EXTREMITY N/A 02/08/2018   Procedure: ABDOMINAL AORTOGRAM W/LOWER EXTREMITY;  Surgeon: Fransisco Hertz, MD;  Location: [facility] INVASIVE CV LAB;  Service: Cardiovascular;  Laterality: N/A;   ABDOMINAL AORTOGRAM W/LOWER EXTREMITY Bilateral 06/15/2020   Procedure: ABDOMINAL AORTOGRAM W/LOWER EXTREMITY;   Surgeon: Maeola Harman, MD;  Location: [facility] INVASIVE CV LAB;  Service: Cardiovascular;  Laterality: Bilateral;   AV FISTULA PLACEMENT Left Aug. 2013 ?   BIOPSY  01/17/2020   Procedure: BIOPSY;  Surgeon: Lynann Bologna, MD;  Location: [facility] ENDOSCOPY;  Service: Endoscopy;;   COLONOSCOPY WITH PROPOFOL N/A 08/21/2018   Procedure: COLONOSCOPY WITH PROPOFOL;  Surgeon: Benancio Deeds, MD;  Location: [facility] ENDOSCOPY;  Service: Gastroenterology;  Laterality: N/A;   ENTEROSCOPY N/A 01/17/2020   Procedure: ENTEROSCOPY;  Surgeon: Lynann Bologna, MD;  Location: [facility] ENDOSCOPY;  Service: Endoscopy;  Laterality: N/A;   ENTEROSCOPY N/A 07/31/2020   Procedure: ENTEROSCOPY;  Surgeon: Sherrilyn Rist, MD;  Location: [facility] ENDOSCOPY;  Service: Gastroenterology;  Laterality: N/A;   ENTEROSCOPY N/A 01/27/2021   Procedure: ENTEROSCOPY;  Surgeon: Benancio Deeds, MD;  Location: [name] ENDOSCOPY;  Service: Gastroenterology;  Laterality: N/A;   ENTEROSCOPY N/A 07/22/2022   Procedure: ENTEROSCOPY;  Surgeon: Sherrilyn Rist, MD;  Location: [facility] ENDOSCOPY;  Service: Gastroenterology;  Laterality: N/A;   ESOPHAGOGASTRODUODENOSCOPY (EGD) WITH PROPOFOL N/A 08/20/2018   Procedure: ESOPHAGOGASTRODUODENOSCOPY (EGD) WITH PROPOFOL;  Surgeon: Iva Boop, MD;  Location: [facility] ENDOSCOPY;  Service: Endoscopy;  Laterality: N/A;   FEMORAL-POPLITEAL BYPASS GRAFT Left 04/11/2018   Procedure: BYPASS GRAFT FEMORAL-POPLITEAL ARTERY LEFT LEG;  Surgeon: Larina Earthly, MD;  Location: [facility] OR;  Service: Vascular;  Laterality: Left;   GIVENS CAPSULE  STUDY N/A 07/31/2020   Procedure: GIVENS CAPSULE STUDY;  Surgeon: Sherrilyn Rist, MD;  Location: [facility] ENDOSCOPY;  Service: Gastroenterology;  Laterality: N/A;   HOT HEMOSTASIS N/A 08/20/2018   Procedure: HOT HEMOSTASIS (ARGON PLASMA COAGULATION/BICAP);  Surgeon: Iva Boop, MD;  Location: [facility] ENDOSCOPY;  Service: Endoscopy;  Laterality: N/A;   HOT HEMOSTASIS N/A 01/17/2020   Procedure: HOT  HEMOSTASIS (ARGON PLASMA COAGULATION/BICAP);  Surgeon: Lynann Bologna, MD;  Location: [facility] ENDOSCOPY;  Service: Endoscopy;  Laterality: N/A;   HOT HEMOSTASIS N/A 01/27/2021   Procedure: HOT HEMOSTASIS (ARGON PLASMA COAGULATION/BICAP);  Surgeon: Benancio Deeds, MD;  Location: [facility] ENDOSCOPY;  Service: Gastroenterology;  Laterality: N/A;   HOT HEMOSTASIS N/A 07/22/2022   Procedure: HOT HEMOSTASIS (ARGON PLASMA COAGULATION/BICAP);  Surgeon: Sherrilyn Rist, MD;  Location: [facility] ENDOSCOPY;  Service: Gastroenterology;  Laterality: N/A;   LEFT HEART CATH AND CORONARY ANGIOGRAPHY N/A 03/29/2018   Procedure: LEFT HEART CATH AND CORONARY ANGIOGRAPHY;  Surgeon: Yvonne Kendall, MD;  Location: [facility] INVASIVE CV LAB;  Service: Cardiovascular;  Laterality: N/A;   LIVER BIOPSY     ORIF ULNAR FRACTURE Left 05/18/2017   Procedure: OPEN REDUCTION INTERNAL FIXATION (ORIF) ULNAR FRACTURE;  Surgeon: Bradly Bienenstock, MD;  Location: [facility] OR;  Service: Orthopedics;  Laterality: Left;   PERIPHERAL VASCULAR INTERVENTION Right 06/15/2020   Procedure: PERIPHERAL VASCULAR INTERVENTION;  Surgeon: Maeola Harman, MD;  Location: [facility] INVASIVE CV LAB;  Service: Cardiovascular;  Laterality: Right;   POLYPECTOMY  08/21/2018   Procedure: POLYPECTOMY;  Surgeon: Benancio Deeds, MD;  Location: [facility] ENDOSCOPY;  Service: Gastroenterology;;   Betsey Amen N/A 12/11/2012   Procedure: Betsey Amen;  Surgeon: Nada Libman, MD;  Location: [facility] CATH LAB;  Service: Cardiovascular;  Laterality: N/A;   WOUND EXPLORATION Left 06/15/2020   Procedure: GROIN  EXPLORATION;  Surgeon: Maeola Harman, MD;  Location: [name] OR;  Service: Vascular;  Laterality: Left;    Current Medications: No outpatient medications have been marked as taking for the 11/18/22 encounter (Appointment) with O'Neal, Ronnald Ramp, MD.     Allergies:    Patient has no known allergies.   Social History: Social History   Socioeconomic History   Marital status:  Single    Spouse name: Not on file   Number of children: 2   Years of education: Not on file   Highest education level: 11th grade  Occupational History   Not on file  Tobacco Use   Smoking status: Every Day    Packs/day: 1.00    Years: 25.00    Additional pack years: 0.00    Total pack years: 25.00    Types: Cigarettes    Last attempt to quit: 08/01/2011    Years since quitting: 11.3   Smokeless tobacco: Never   Tobacco comments:    he smokes 3 cigarettes a day  Vaping Use   Vaping Use: Never used  Substance and Sexual Activity   Alcohol use: Yes    Alcohol/week: 10.0 standard drinks of alcohol    Types: 10 Glasses of wine per week    Comment: drank ETOH Saturday, 8 ounces vodka   Drug use: Not Currently    Types: Cocaine    Comment: not lately   Sexual activity: Not on file  Other Topics Concern   Not on file  Social History Narrative   Patient is living in a apartment alone. Professional cement finisher. Has 2 children (42yo & 38yo sons). Plans to make HCPOA for sister Raymond [name] to  make decisions for him should he need them.    Social Determinants of Health   Financial Resource Strain: Low Risk  (12/29/2021)   Overall Financial Resource Strain (CARDIA)    Difficulty of Paying Living Expenses: Not very hard  Food Insecurity: No Food Insecurity (12/29/2021)   Hunger Vital Sign    Worried About Running Out of Food in the Last Year: Never true    Ran Out of Food in the Last Year: Never true  Transportation Needs: Unmet Transportation Needs (07/24/2022)   PRAPARE - Administrator, Civil Service (Medical): Yes    Lack of Transportation (Non-Medical): Yes  Physical Activity: Not on file  Stress: Not on file  Social Connections: Unknown (08/18/2018)   Social Connection and Isolation Panel [NHANES]    Frequency of Communication with Friends and Family: Three times a week    Frequency of Social Gatherings with Friends and Family: Never    Attends Religious Services:  Never    Database administrator or Organizations: No    Attends Engineer, structural: Never    Marital Status: Not on file     Family History: The patient's ***family history includes Diabetes in his father; Heart disease in his father; Hypertension in his father.  ROS:   All other ROS reviewed and negative. Pertinent positives noted in the HPI.     EKGs/Labs/Other Studies Reviewed:   The following studies were personally reviewed by me today:  EKG:  EKG is *** ordered today.  The ekg ordered today demonstrates ***, and was personally reviewed by me.   Recent Labs: 07/30/2022: B Natriuretic Peptide 1,497.0 08/20/2022: ALT 9; TSH 3.018 08/24/2022: Magnesium 1.8 08/25/2022: BUN 37; Creatinine, Ser 9.85; Potassium 5.0; Sodium 137 08/26/2022: Hemoglobin 7.9; Platelets 120   Recent Lipid Panel    Component Value Date/Time   CHOL 158 03/27/2018 1420   TRIG 649 (H) 04/04/2021 0500   HDL 74 03/27/2018 1420   CHOLHDL 2.1 03/27/2018 1420   CHOLHDL 2.0 Ratio 10/04/2007 2023   VLDL 31 10/04/2007 2023   LDLCALC 69 03/27/2018 1420    Physical Exam:   VS:  There were no vitals taken for this visit.   Wt Readings from Last 3 Encounters:  08/27/22 160 lb 0.9 oz (72.6 kg)  07/21/22 141 lb 1.5 oz (64 kg)  07/07/22 138 lb 1.6 oz (62.6 kg)    General: Well nourished, well developed, in no acute distress Head: Atraumatic, normal size  Eyes: PEERLA, EOMI  Neck: Supple, no JVD Endocrine: No thryomegaly Cardiac: Normal S1, S2; RRR; no murmurs, rubs, or gallops Lungs: Clear to auscultation bilaterally, no wheezing, rhonchi or rales  Abd: Soft, nontender, no hepatomegaly  Ext: No edema, pulses 2+ Musculoskeletal: No deformities, BUE and BLE strength normal and equal Skin: Warm and dry, no rashes   Neuro: Alert and oriented to person, place, time, and situation, CNII-XII grossly intact, no focal deficits  Psych: Normal mood and affect   ASSESSMENT:   Raymond [name] is a 67 y.o. male  who presents for the following: No diagnosis found.  PLAN:   There are no diagnoses linked to this encounter.  {Are you ordering a CV Procedure (e.g. stress test, cath, DCCV, TEE, etc)?   Press F2        :[id number]}  Disposition: No follow-ups on file.  Medication Adjustments/Labs and Tests Ordered: Current medicines are reviewed at length with the patient today.  Concerns regarding medicines are outlined above.  No orders of the defined types were placed in this encounter.  No orders of the defined types were placed in this encounter.   There are no Patient Instructions on file for this visit.   Time Spent with Patient: I have spent a total of *** minutes with patient reviewing hospital notes, telemetry, EKGs, labs and examining the patient as well as establishing an assessment and plan that was discussed with the patient.  > 50% of time was spent in direct patient care.  Signed, Lenna Gilford. Flora Lipps, MD, [facility]  [facility]  [address], [address] [place], [place] [address] [phone]  11/17/2022 8:37 PM

## 2022-11-18 ENCOUNTER — Ambulatory Visit: Payer: Medicare HMO | Attending: Cardiology | Admitting: Cardiovascular Disease

## 2022-11-18 DIAGNOSIS — I5022 Chronic systolic (congestive) heart failure: Secondary | ICD-10-CM

## 2022-11-18 DIAGNOSIS — I10 Essential (primary) hypertension: Secondary | ICD-10-CM

## 2022-11-18 DIAGNOSIS — I2510 Atherosclerotic heart disease of native coronary artery without angina pectoris: Secondary | ICD-10-CM

## 2022-11-18 DIAGNOSIS — E782 Mixed hyperlipidemia: Secondary | ICD-10-CM

## 2022-11-18 DIAGNOSIS — I4821 Permanent atrial fibrillation: Secondary | ICD-10-CM

## 2022-11-18 DIAGNOSIS — I739 Peripheral vascular disease, unspecified: Secondary | ICD-10-CM

## 2022-12-01 ENCOUNTER — Encounter (HOSPITAL_COMMUNITY): Payer: Self-pay

## 2022-12-01 ENCOUNTER — Emergency Department (HOSPITAL_COMMUNITY)
Admission: EM | Admit: 2022-12-01 | Discharge: 2022-12-01 | Disposition: A | Discharge status: Home / Self Care | Payer: Medicare HMO | Attending: Emergency Medicine | Admitting: Emergency Medicine

## 2022-12-01 ENCOUNTER — Other Ambulatory Visit: Payer: Self-pay

## 2022-12-01 DIAGNOSIS — Z79899 Other long term (current) drug therapy: Secondary | ICD-10-CM | POA: Diagnosis not present

## 2022-12-01 DIAGNOSIS — Z7982 Long term (current) use of aspirin: Secondary | ICD-10-CM | POA: Diagnosis not present

## 2022-12-01 DIAGNOSIS — Z992 Dependence on renal dialysis: Secondary | ICD-10-CM | POA: Diagnosis not present

## 2022-12-01 DIAGNOSIS — N186 End stage renal disease: Secondary | ICD-10-CM | POA: Insufficient documentation

## 2022-12-01 DIAGNOSIS — D631 Anemia in chronic kidney disease: Secondary | ICD-10-CM

## 2022-12-01 DIAGNOSIS — R531 Weakness: Secondary | ICD-10-CM | POA: Diagnosis present

## 2022-12-01 LAB — CBC WITH DIFFERENTIAL/PLATELET
Abs Immature Granulocytes: 0.02 10*3/uL (ref 0.00–0.07)
Basophils Absolute: 0 10*3/uL (ref 0.0–0.1)
Basophils Relative: 0 %
Eosinophils Absolute: 0 10*3/uL (ref 0.0–0.5)
Eosinophils Relative: 1 %
HCT: 23.9 % — ABNORMAL LOW (ref 39.0–52.0)
Hemoglobin: 7.1 g/dL — ABNORMAL LOW (ref 13.0–17.0)
Immature Granulocytes: 1 %
Lymphocytes Relative: 16 %
Lymphs Abs: 0.5 10*3/uL — ABNORMAL LOW (ref 0.7–4.0)
MCH: 32.3 pg (ref 26.0–34.0)
MCHC: 29.7 g/dL — ABNORMAL LOW (ref 30.0–36.0)
MCV: 108.6 fL — ABNORMAL HIGH (ref 80.0–100.0)
Monocytes Absolute: 0.5 10*3/uL (ref 0.1–1.0)
Monocytes Relative: 15 %
Neutro Abs: 2.2 10*3/uL (ref 1.7–7.7)
Neutrophils Relative %: 67 %
Platelets: 105 10*3/uL — ABNORMAL LOW (ref 150–400)
RBC: 2.2 MIL/uL — ABNORMAL LOW (ref 4.22–5.81)
RDW: 20.6 % — ABNORMAL HIGH (ref 11.5–15.5)
WBC: 3.3 10*3/uL — ABNORMAL LOW (ref 4.0–10.5)
nRBC: 0 % (ref 0.0–0.2)

## 2022-12-01 LAB — BASIC METABOLIC PANEL
Anion gap: 13 (ref 5–15)
BUN: 40 mg/dL — ABNORMAL HIGH (ref 8–23)
CO2: 28 mmol/L (ref 22–32)
Calcium: 7.6 mg/dL — ABNORMAL LOW (ref 8.9–10.3)
Chloride: 97 mmol/L — ABNORMAL LOW (ref 98–111)
Creatinine, Ser: 8.33 mg/dL — ABNORMAL HIGH (ref 0.61–1.24)
GFR, Estimated: 6 mL/min — ABNORMAL LOW (ref >60)
Glucose, Bld: 94 mg/dL (ref 70–99)
Potassium: 4.5 mmol/L (ref 3.5–5.1)
Sodium: 138 mmol/L (ref 135–145)

## 2022-12-01 LAB — TYPE AND SCREEN
ABO/RH(D): B POS
Antibody Screen: NEGATIVE

## 2022-12-01 NOTE — ED Provider Notes (Signed)
[place] EMERGENCY DEPARTMENT AT [facility] Provider Note   CSN: [id number] Arrival date & time: 12/01/22  0945     History  Chief Complaint  Patient presents with   Abnormal Lab   Weakness    Raymond [name] is a 67 y.o. male.  HPI Patient presents from his dialysis center with concern for anemia. Patient denies complaints, states that he feels in his usual state of health.  Today he went for dialysis, was informed there that he was anemic and was sent here for evaluation.  Patient notes that he has had episodic anemia for years, essentially since he started dialysis.  He denies specifically dyspnea, chest pain or any other complaints.     Home Medications Prior to Admission medications   Medication Sig Start Date End Date Taking? Authorizing Provider  acetaminophen (TYLENOL) 325 MG tablet Take 2 tablets (650 mg total) by mouth every 6 (six) hours as needed for mild pain, fever or headache. 08/29/22   Lonia Blood, MD  albuterol (VENTOLIN HFA) 108 (90 Base) MCG/ACT inhaler Inhale 3 puffs into the lungs daily as needed for shortness of breath or wheezing. 11/06/19   [provider]  aspirin 81 MG chewable tablet Chew 1 tablet (81 mg total) by mouth daily. 07/26/22   Marinda Elk, MD  atorvastatin (LIPITOR) 40 MG tablet Take 1 tablet (40 mg total) by mouth daily. 10/13/22 01/11/23  Sande Rives, MD  BREO ELLIPTA 200-25 MCG/ACT AEPB Inhale 2 puffs into the lungs daily as needed (for shortness of breath). 09/17/21   [provider]  calcitRIOL (ROCALTROL) 0.25 MCG capsule Take 3 capsules (0.75 mcg total) by mouth Every Tuesday,Thursday,and Saturday with dialysis. 08/30/22   Lonia Blood, MD  carvedilol (COREG) 6.25 MG tablet Take 1 tablet (6.25 mg total) by mouth 2 (two) times daily. 08/29/22   Lonia Blood, MD  cinacalcet (SENSIPAR) 30 MG tablet Take 3 tablets (90 mg total) by mouth Every Tuesday,Thursday,and Saturday with  dialysis. 08/30/22   Lonia Blood, MD  Darbepoetin Alfa (ARANESP) 150 MCG/0.3ML SOSY injection Inject 0.3 mLs (150 mcg total) into the skin every Tuesday at 6 PM. 08/30/22   Lonia Blood, MD  isosorbide mononitrate (IMDUR) 30 MG 24 hr tablet Take 0.5 tablets (15 mg total) by mouth daily. 10/13/22 01/11/23  O'NealRonnald Ramp, MD  multivitamin (RENA-VIT) TABS tablet Take 1 tablet by mouth at bedtime. 09/17/21   Marguerita Merles Latif, DO  sevelamer carbonate (RENVELA) 800 MG tablet Take 5 tablets (4,000 mg total) by mouth 3 (three) times daily with meals. 09/17/21   Marguerita Merles Latif, DO  apixaban (ELIQUIS) 5 MG TABS tablet Take 1 tablet (5 mg total) by mouth 2 (two) times daily. 06/22/20 08/03/20  Graceann Congress, PA-C      Allergies    Patient has no known allergies.    Review of Systems   Review of Systems  All other systems reviewed and are negative.   Physical Exam Updated Vital Signs BP (!) 156/95   Pulse (!) 38   Ht 5\' 8"  (1.727 m)   Wt 72.6 kg   SpO2 100%   BMI 24.33 kg/m  Physical Exam Vitals and nursing note reviewed.  Constitutional:      General: He is not in acute distress.    Appearance: He is well-developed.  HENT:     Head: Normocephalic and atraumatic.  Eyes:     Conjunctiva/sclera: Conjunctivae normal.  Cardiovascular:  Rate and Rhythm: Normal rate and regular rhythm.  Pulmonary:     Effort: Pulmonary effort is normal. No respiratory distress.     Breath sounds: No stridor.  Abdominal:     General: There is no distension.  Musculoskeletal:     Comments: Left AV fistula unremarkable  Skin:    General: Skin is warm and dry.  Neurological:     Mental Status: He is alert and oriented to person, place, and time.     ED Results / Procedures / Treatments   Labs (all labs ordered are listed, but only abnormal results are displayed) Labs Reviewed  BASIC METABOLIC PANEL - Abnormal; Notable for the following components:      Result Value    Chloride 97 (*)    BUN 40 (*)    Creatinine, Ser 8.33 (*)    Calcium 7.6 (*)    GFR, Estimated 6 (*)    All other components within normal limits  CBC WITH DIFFERENTIAL/PLATELET - Abnormal; Notable for the following components:   WBC 3.3 (*)    RBC 2.20 (*)    Hemoglobin 7.1 (*)    HCT 23.9 (*)    MCV 108.6 (*)    MCHC 29.7 (*)    RDW 20.6 (*)    Platelets 105 (*)    Lymphs Abs 0.5 (*)    All other components within normal limits  TYPE AND SCREEN    EKG None  Radiology No results found.  Procedures Procedures    Medications Ordered in ED Medications - No data to display  ED Course/ Medical Decision Making/ A&P                             Medical Decision Making Adult male with end-stage renal disease, chronic anemia presents with concern for anemia per outside hospital.  Patient is awake, alert, hemodynamically unremarkable.  Given his history, GI blood loss, or symptomatic anemia are considerations, but the patient essentially has no complaints, is hemodynamically unremarkable here.  Heart rate charted as 38, but actual heart rate in the room was 65, sinus normal Pulse ox 100% room air normal After discussed the patient's labs with our nephrologist, Dr. Allena Katz, given his hemoglobin 7.1, consistent with multiple prior studies, no indication for transfusion due to his dialysis status, patient will follow-up with his own dialysis team.  Amount and/or Complexity of Data Reviewed External Data Reviewed: notes. Labs: ordered. Decision-making details documented in ED Course.   12:19 PM Patient awake and alert, no distress aware of importance of following up with his nephrologist.        Final Clinical Impression(s) / ED Diagnoses Final diagnoses:  Anemia due to chronic kidney disease, on chronic dialysis [facility])    Rx / DC Orders ED Discharge Orders     None         Gerhard Munch, MD 12/01/22 1219

## 2022-12-01 NOTE — ED Triage Notes (Signed)
Pt. Stated, they sent me here due to My blood is so low. They called me this morning and said to come here for blood. Last treatment was Tuesday - full treatment.

## 2022-12-14 ENCOUNTER — Encounter: Payer: Self-pay | Admitting: Podiatry

## 2022-12-14 ENCOUNTER — Ambulatory Visit (INDEPENDENT_AMBULATORY_CARE_PROVIDER_SITE_OTHER): Payer: Medicare HMO | Admitting: Podiatry

## 2022-12-14 DIAGNOSIS — B351 Tinea unguium: Secondary | ICD-10-CM

## 2022-12-14 DIAGNOSIS — Z992 Dependence on renal dialysis: Secondary | ICD-10-CM

## 2022-12-14 DIAGNOSIS — M79676 Pain in unspecified toe(s): Secondary | ICD-10-CM

## 2022-12-14 DIAGNOSIS — N186 End stage renal disease: Secondary | ICD-10-CM

## 2022-12-14 NOTE — Progress Notes (Signed)

## 2022-12-19 ENCOUNTER — Encounter: Payer: Self-pay | Admitting: Emergency Medicine

## 2023-01-05 ENCOUNTER — Encounter (HOSPITAL_COMMUNITY): Payer: Self-pay | Admitting: *Deleted

## 2023-01-05 ENCOUNTER — Emergency Department (HOSPITAL_COMMUNITY)
Admission: EM | Admit: 2023-01-05 | Discharge: 2023-01-05 | Disposition: A | Discharge status: Home / Self Care | Payer: Medicare HMO | Attending: Emergency Medicine | Admitting: Emergency Medicine

## 2023-01-05 ENCOUNTER — Other Ambulatory Visit: Payer: Self-pay

## 2023-01-05 ENCOUNTER — Emergency Department (HOSPITAL_COMMUNITY): Payer: Medicare HMO

## 2023-01-05 DIAGNOSIS — I4891 Unspecified atrial fibrillation: Secondary | ICD-10-CM | POA: Insufficient documentation

## 2023-01-05 DIAGNOSIS — Z7901 Long term (current) use of anticoagulants: Secondary | ICD-10-CM | POA: Diagnosis not present

## 2023-01-05 DIAGNOSIS — N186 End stage renal disease: Secondary | ICD-10-CM | POA: Insufficient documentation

## 2023-01-05 DIAGNOSIS — Z992 Dependence on renal dialysis: Secondary | ICD-10-CM | POA: Insufficient documentation

## 2023-01-05 DIAGNOSIS — R1010 Upper abdominal pain, unspecified: Secondary | ICD-10-CM | POA: Diagnosis not present

## 2023-01-05 DIAGNOSIS — Z7982 Long term (current) use of aspirin: Secondary | ICD-10-CM | POA: Diagnosis not present

## 2023-01-05 DIAGNOSIS — E876 Hypokalemia: Secondary | ICD-10-CM | POA: Insufficient documentation

## 2023-01-05 DIAGNOSIS — J449 Chronic obstructive pulmonary disease, unspecified: Secondary | ICD-10-CM | POA: Diagnosis not present

## 2023-01-05 LAB — CBC WITH DIFFERENTIAL/PLATELET
Abs Immature Granulocytes: 0 10*3/uL (ref 0.00–0.07)
Basophils Absolute: 0 10*3/uL (ref 0.0–0.1)
Basophils Relative: 0 %
Eosinophils Absolute: 0.1 10*3/uL (ref 0.0–0.5)
Eosinophils Relative: 3 %
HCT: 30.3 % — ABNORMAL LOW (ref 39.0–52.0)
Hemoglobin: 9.2 g/dL — ABNORMAL LOW (ref 13.0–17.0)
Lymphocytes Relative: 21 %
Lymphs Abs: 0.4 10*3/uL — ABNORMAL LOW (ref 0.7–4.0)
MCH: 31.6 pg (ref 26.0–34.0)
MCHC: 30.4 g/dL (ref 30.0–36.0)
MCV: 104.1 fL — ABNORMAL HIGH (ref 80.0–100.0)
Monocytes Absolute: 0.4 10*3/uL (ref 0.1–1.0)
Monocytes Relative: 18 %
Neutro Abs: 1.2 10*3/uL — ABNORMAL LOW (ref 1.7–7.7)
Neutrophils Relative %: 58 %
Platelets: 100 10*3/uL — ABNORMAL LOW (ref 150–400)
RBC: 2.91 MIL/uL — ABNORMAL LOW (ref 4.22–5.81)
RDW: 18.1 % — ABNORMAL HIGH (ref 11.5–15.5)
WBC: 2.1 10*3/uL — ABNORMAL LOW (ref 4.0–10.5)
nRBC: 0 % (ref 0.0–0.2)

## 2023-01-05 LAB — TROPONIN I (HIGH SENSITIVITY): Troponin I (High Sensitivity): 16 ng/L (ref <18)

## 2023-01-05 LAB — COMPREHENSIVE METABOLIC PANEL
ALT: 13 U/L (ref 0–44)
AST: 17 U/L (ref 15–41)
Albumin: 3.4 g/dL — ABNORMAL LOW (ref 3.5–5.0)
Alkaline Phosphatase: 125 U/L (ref 38–126)
Anion gap: 11 (ref 5–15)
BUN: 14 mg/dL (ref 8–23)
CO2: 31 mmol/L (ref 22–32)
Calcium: 6.7 mg/dL — ABNORMAL LOW (ref 8.9–10.3)
Chloride: 94 mmol/L — ABNORMAL LOW (ref 98–111)
Creatinine, Ser: 4.3 mg/dL — ABNORMAL HIGH (ref 0.61–1.24)
GFR, Estimated: 14 mL/min — ABNORMAL LOW (ref >60)
Glucose, Bld: 87 mg/dL (ref 70–99)
Potassium: 3.2 mmol/L — ABNORMAL LOW (ref 3.5–5.1)
Sodium: 136 mmol/L (ref 135–145)
Total Bilirubin: 0.6 mg/dL (ref 0.3–1.2)
Total Protein: 7.6 g/dL (ref 6.5–8.1)

## 2023-01-05 LAB — MAGNESIUM: Magnesium: 2 mg/dL (ref 1.7–2.4)

## 2023-01-05 LAB — LIPASE, BLOOD: Lipase: 73 U/L — ABNORMAL HIGH (ref 11–51)

## 2023-01-05 NOTE — ED Triage Notes (Signed)
BIB [facility] from [facility] for abd cramping and arrhythmia towards the endo of [facility]. 2.5L pulled off, developed sx, and gave back 400cc. Abd cramping resolved. Denies sx for EMS or upon arrival. EMS reports Aflutter 3:1 wiwth PVCs/ PACs. VSS. 134/92, HR 75, SPO2 97% RA. 20g NSL R AC. Significant card hx. HD for 11 years. No longer makes urine. Cardiac arrest 2 yrs ago. Arrives alert, NAD, calm, interactive, speaking in clear complete sentences, resps e/u. HD RN notified of clamps in place on L upper arm AVG.

## 2023-01-05 NOTE — ED Notes (Signed)
Xray at [facility] 

## 2023-01-05 NOTE — Discharge Instructions (Addendum)
Evaluation today reveals you are in atrial fibrillation.  Recommend you continue taking your carvedilol as prescribed.  Also recommend you follow-up with cardiology for your atrial fibrillation.  If you have new chest pain, shortness of breath or palpitations or any other concerning symptom please return emerged part for further evaluation.

## 2023-01-05 NOTE — ED Provider Notes (Signed)
[facility] EMERGENCY DEPARTMENT AT [facility] Provider Note   CSN: [id number] Arrival date & time: 01/05/23  1519     History  Chief Complaint  Patient presents with   Irregular Heart Beat   HPI Raymond [name] is a 67 y.o. male with paroxysmal atrial fibrillation, ESRD, COPD and PAD presenting for irregular heartbeat.  States he was at his dialysis appointment today.  Per EMS, 2.5 L of fluid were drawn by dialysis today.  Patient started to have upper abdominal cramping.  They gave back 400 mL of fluid and symptoms improved.  At that time noted to be in an irregular heart rhythm.  EMS was called and EKG at that time revealed atrial flutter.  Patient denies associated shortness of breath, chest pain and palpitations.  No intervention was required and route.  Patient states he is compliant taking his carvedilol at home.  HPI     Home Medications Prior to Admission medications   Medication Sig Start Date End Date Taking? Authorizing Provider  acetaminophen (TYLENOL) 325 MG tablet Take 2 tablets (650 mg total) by mouth every 6 (six) hours as needed for mild pain, fever or headache. 08/29/22   Lonia Blood, MD  albuterol (VENTOLIN HFA) 108 (90 Base) MCG/ACT inhaler Inhale 3 puffs into the lungs daily as needed for shortness of breath or wheezing. 11/06/19   [provider]  aspirin 81 MG chewable tablet Chew 1 tablet (81 mg total) by mouth daily. 07/26/22   Marinda Elk, MD  atorvastatin (LIPITOR) 40 MG tablet Take 1 tablet (40 mg total) by mouth daily. 10/13/22 01/11/23  Sande Rives, MD  BREO ELLIPTA 200-25 MCG/ACT AEPB Inhale 2 puffs into the lungs daily as needed (for shortness of breath). 09/17/21   [provider]  calcitRIOL (ROCALTROL) 0.25 MCG capsule Take 3 capsules (0.75 mcg total) by mouth Every Tuesday,Thursday,and Saturday with dialysis. 08/30/22   Lonia Blood, MD  carvedilol (COREG) 6.25 MG tablet Take 1 tablet (6.25 mg total)  by mouth 2 (two) times daily. 08/29/22   Lonia Blood, MD  cinacalcet (SENSIPAR) 30 MG tablet Take 3 tablets (90 mg total) by mouth Every Tuesday,Thursday,and Saturday with dialysis. 08/30/22   Lonia Blood, MD  Darbepoetin Alfa (ARANESP) 150 MCG/0.3ML SOSY injection Inject 0.3 mLs (150 mcg total) into the skin every Tuesday at 6 PM. 08/30/22   Lonia Blood, MD  isosorbide mononitrate (IMDUR) 30 MG 24 hr tablet Take 0.5 tablets (15 mg total) by mouth daily. 10/13/22 01/11/23  O'NealRonnald Ramp, MD  multivitamin (RENA-VIT) TABS tablet Take 1 tablet by mouth at bedtime. 09/17/21   Marguerita Merles Latif, DO  sevelamer carbonate (RENVELA) 800 MG tablet Take 5 tablets (4,000 mg total) by mouth 3 (three) times daily with meals. 09/17/21   Marguerita Merles Latif, DO  apixaban (ELIQUIS) 5 MG TABS tablet Take 1 tablet (5 mg total) by mouth 2 (two) times daily. 06/22/20 08/03/20  Graceann Congress, PA-C      Allergies    Patient has no known allergies.    Review of Systems   See HPI for pertinent positives   Physical Exam   Vitals:   01/05/23 1550 01/05/23 1600  BP: (!) 145/97 (!) 155/101  Resp: (!) 24 15  Temp:    SpO2:      CONSTITUTIONAL:  well-appearing, NAD NEURO:  Alert and oriented x 3, CN 3-12 grossly intact EYES:  eyes equal and reactive ENT/NECK:  Supple, no  stridor CARDIO:  regular rate and irregular rhythm, appears well-perfused  PULM:  No respiratory distress, CTAB GI/GU:  non-distended, soft MSK/SPINE:  No gross deformities, no edema, moves all extremities  SKIN:  no rash, atraumatic  *Additional and/or pertinent findings included in MDM below  ED Results / Procedures / Treatments   Labs (all labs ordered are listed, but only abnormal results are displayed) Labs Reviewed  COMPREHENSIVE METABOLIC PANEL - Abnormal; Notable for the following components:      Result Value   Potassium 3.2 (*)    Chloride 94 (*)    Creatinine, Ser 4.30 (*)    Calcium 6.7 (*)     Albumin 3.4 (*)    GFR, Estimated 14 (*)    All other components within normal limits  LIPASE, BLOOD - Abnormal; Notable for the following components:   Lipase 73 (*)    All other components within normal limits  CBC WITH DIFFERENTIAL/PLATELET - Abnormal; Notable for the following components:   WBC 2.1 (*)    RBC 2.91 (*)    Hemoglobin 9.2 (*)    HCT 30.3 (*)    MCV 104.1 (*)    RDW 18.1 (*)    Platelets 100 (*)    Neutro Abs 1.2 (*)    Lymphs Abs 0.4 (*)    All other components within normal limits  MAGNESIUM  TROPONIN I (HIGH SENSITIVITY)  TROPONIN I (HIGH SENSITIVITY)    EKG None  Radiology DG Chest Portable 1 View  Result Date: 01/05/2023 CLINICAL DATA:  Hemodialysis EXAM: PORTABLE CHEST 1 VIEW COMPARISON:  Chest x-ray dated August 20, 2022 FINDINGS: Unchanged cardiomegaly. Nodular opacity of the right lower lung. Mild bibasilar atelectasis. Lungs are otherwise clear. No evidence of pleural effusion or pneumothorax. IMPRESSION: 1. Nodular opacity of the right lower lung, likely a nipple shadow. Recommend repeat radiograph with nipple markers. 2. No acute cardiopulmonary abnormality. Electronically Signed   By: Allegra Lai M.D.   On: 01/05/2023 16:03    Procedures Procedures    Medications Ordered in ED Medications - No data to display  ED Course/ Medical Decision Making/ A&P                             Medical Decision Making Amount and/or Complexity of Data Reviewed Labs: ordered. Radiology: ordered.   Initial Impression and Ddx 67 year old well-appearing male presenting for irregular heart rhythm.  Exam notable for irregular heart rhythm but otherwise reassuring.  DDx includes A-fib with RVR, other arrhythmia, ACS, electrolyte derangement. Patient PMH that increases complexity of ED encounter:  paroxysmal atrial fibrillation, ESRD, COPD and PAD  Interpretation of Diagnostics - I independent reviewed and interpreted the labs as followed: Mild  hypokalemia, anemia near baseline  - I independently visualized the following imaging with scope of interpretation limited to determining acute life threatening conditions related to emergency care: CXR, which revealed no acute cardiopulmonary process noted  -I personally reviewed and interpreted EKG which revealed atrial fibrillation with normal heart rate  Patient Reassessment and Ultimate Disposition/Management Overall appears clinically well.  Workup reassuring.  EKG today revealed concern for A-fib but patient is hypertensive and with normal heart rate.  Advised to continue taking his carvedilol as prescribed.  Discharged ambulatory referral to cardiology.  Discussed return precautions.  Vital stable at discharge.  Discharged in good condition.  Patient management required discussion with the following services or consulting groups:  None  Complexity of Problems Addressed  Acute complicated illness or Injury  Additional Data Reviewed and Analyzed Further history obtained from: Past medical history and medications listed in the EMR and Prior ED visit notes  Patient Encounter Risk Assessment None         Final Clinical Impression(s) / ED Diagnoses Final diagnoses:  Atrial fibrillation, unspecified type (HCC)    Rx / DC Orders ED Discharge Orders          Ordered    Ambulatory referral to Cardiology       Comments: If you have not heard from the Cardiology office within the next 72 hours please call [phone].   01/05/23 1800              Gareth Eagle, PA-C 01/05/23 1801    Gerhard Munch, MD 01/09/23 [phone]

## 2023-01-05 NOTE — ED Notes (Signed)
HD RN at [facility] to removed AVG L upper arm clamps

## 2023-01-05 NOTE — Progress Notes (Signed)
Got a call from ED RN Raelyn Number pt came from clinic with clamps on access. Clamps removed access isnt bleeding.

## 2023-01-05 NOTE — ED Notes (Signed)
EDP at [facility] 

## 2023-01-13 NOTE — Progress Notes (Deleted)
Cardiology Clinic Note   Patient Name: Raymond [name] Date of Encounter: 01/13/2023  Primary Care Provider:  [name], FNP Primary Cardiologist:  Charlton Haws, MD  Patient Profile    67 year old male with history of end-stage renal disease on hemodialysis, PAD status post femoropopliteal, nonobstructive CAD, hypertension, atrial fibrillation/flutter (not on anticoagulation due to history of severe GI bleed), polysubstance abuse, who was last seen by cardiology during hospitalization on 07/07/2022 for abnormal EKG revealing new anterior lateral T wave inversions.  The patient had missed 2 hemodialysis sessions, with a history of missing dialysis.  After review of echo completed during hospitalization, it was not suspected that coronary ischemia was reason for anterior lateral T wave inversions.  He would be treated medically as review of the left heart catheterization 2019 did not show any obstructive disease.  Past Medical History    Past Medical History:  Diagnosis Date   Anemia    CAD (coronary artery disease)    a. 03/2018: cath showing 20 to 30% stenosis along the LAD, luminal irregularities along the RCA, and angiographically normal LCx   COPD (chronic obstructive pulmonary disease) (HCC)    ESRD (end stage renal disease) on dialysis (HCC)    "MWF; [facility]" (07/22/17)   Hepatitis C    Still positive s/p liver biopsy at [facility]  and interferon therapy for 6 months. Most recent lab work was on 10/24/12   Hepatitis C    "took the tx; gone now" (12/05/2016)  Was treated   History of blood transfusion ~ 2012/2013   "related to my kidneys; blood was low"   Hypertension    Peripheral arterial disease (HCC)    Substance abuse (HCC)    Thyroid disease    Type 2 diabetes mellitus with other diabetic kidney complication (HCC) 09/24/2018   Past Surgical History:  Procedure Laterality Date   ABDOMINAL AORTOGRAM W/LOWER EXTREMITY N/A 02/08/2018   Procedure: ABDOMINAL AORTOGRAM  W/LOWER EXTREMITY;  Surgeon: Fransisco Hertz, MD;  Location: [facility] INVASIVE CV LAB;  Service: Cardiovascular;  Laterality: N/A;   ABDOMINAL AORTOGRAM W/LOWER EXTREMITY Bilateral 06/15/2020   Procedure: ABDOMINAL AORTOGRAM W/LOWER EXTREMITY;  Surgeon: Maeola Harman, MD;  Location: [facility] INVASIVE CV LAB;  Service: Cardiovascular;  Laterality: Bilateral;   AV FISTULA PLACEMENT Left Aug. 2013 ?   BIOPSY  01/17/2020   Procedure: BIOPSY;  Surgeon: Lynann Bologna, MD;  Location: [facility] ENDOSCOPY;  Service: Endoscopy;;   COLONOSCOPY WITH PROPOFOL N/A 08/21/2018   Procedure: COLONOSCOPY WITH PROPOFOL;  Surgeon: Benancio Deeds, MD;  Location: [facility] ENDOSCOPY;  Service: Gastroenterology;  Laterality: N/A;   ENTEROSCOPY N/A 01/17/2020   Procedure: ENTEROSCOPY;  Surgeon: Lynann Bologna, MD;  Location: [facility] ENDOSCOPY;  Service: Endoscopy;  Laterality: N/A;   ENTEROSCOPY N/A 07/31/2020   Procedure: ENTEROSCOPY;  Surgeon: Sherrilyn Rist, MD;  Location: [facility] ENDOSCOPY;  Service: Gastroenterology;  Laterality: N/A;   ENTEROSCOPY N/A 01/27/2021   Procedure: ENTEROSCOPY;  Surgeon: Benancio Deeds, MD;  Location: [facility] ENDOSCOPY;  Service: Gastroenterology;  Laterality: N/A;   ENTEROSCOPY N/A 07/22/2022   Procedure: ENTEROSCOPY;  Surgeon: Sherrilyn Rist, MD;  Location: [facility] ENDOSCOPY;  Service: Gastroenterology;  Laterality: N/A;   ESOPHAGOGASTRODUODENOSCOPY (EGD) WITH PROPOFOL N/A 08/20/2018   Procedure: ESOPHAGOGASTRODUODENOSCOPY (EGD) WITH PROPOFOL;  Surgeon: Iva Boop, MD;  Location: [facility] ENDOSCOPY;  Service: Endoscopy;  Laterality: N/A;   FEMORAL-POPLITEAL BYPASS GRAFT Left 04/11/2018   Procedure: BYPASS GRAFT FEMORAL-POPLITEAL ARTERY LEFT LEG;  Surgeon: Larina Earthly,  MD;  Location: [facility] OR;  Service: Vascular;  Laterality: Left;   GIVENS CAPSULE STUDY N/A 07/31/2020   Procedure: GIVENS CAPSULE STUDY;  Surgeon: Sherrilyn Rist, MD;  Location: [facility] ENDOSCOPY;  Service: Gastroenterology;  Laterality: N/A;   HOT  HEMOSTASIS N/A 08/20/2018   Procedure: HOT HEMOSTASIS (ARGON PLASMA COAGULATION/BICAP);  Surgeon: Iva Boop, MD;  Location: [facility] ENDOSCOPY;  Service: Endoscopy;  Laterality: N/A;   HOT HEMOSTASIS N/A 01/17/2020   Procedure: HOT HEMOSTASIS (ARGON PLASMA COAGULATION/BICAP);  Surgeon: Lynann Bologna, MD;  Location: [facility] ENDOSCOPY;  Service: Endoscopy;  Laterality: N/A;   HOT HEMOSTASIS N/A 01/27/2021   Procedure: HOT HEMOSTASIS (ARGON PLASMA COAGULATION/BICAP);  Surgeon: Benancio Deeds, MD;  Location: [facility] ENDOSCOPY;  Service: Gastroenterology;  Laterality: N/A;   HOT HEMOSTASIS N/A 07/22/2022   Procedure: HOT HEMOSTASIS (ARGON PLASMA COAGULATION/BICAP);  Surgeon: Sherrilyn Rist, MD;  Location: [facility] ENDOSCOPY;  Service: Gastroenterology;  Laterality: N/A;   LEFT HEART CATH AND CORONARY ANGIOGRAPHY N/A 03/29/2018   Procedure: LEFT HEART CATH AND CORONARY ANGIOGRAPHY;  Surgeon: Yvonne Kendall, MD;  Location: [facility] INVASIVE CV LAB;  Service: Cardiovascular;  Laterality: N/A;   LIVER BIOPSY     ORIF ULNAR FRACTURE Left 05/18/2017   Procedure: OPEN REDUCTION INTERNAL FIXATION (ORIF) ULNAR FRACTURE;  Surgeon: Bradly Bienenstock, MD;  Location: [facility] OR;  Service: Orthopedics;  Laterality: Left;   PERIPHERAL VASCULAR INTERVENTION Right 06/15/2020   Procedure: PERIPHERAL VASCULAR INTERVENTION;  Surgeon: Maeola Harman, MD;  Location: [facility] INVASIVE CV LAB;  Service: Cardiovascular;  Laterality: Right;   POLYPECTOMY  08/21/2018   Procedure: POLYPECTOMY;  Surgeon: Benancio Deeds, MD;  Location: [facility] ENDOSCOPY;  Service: Gastroenterology;;   Betsey Amen N/A 12/11/2012   Procedure: Betsey Amen;  Surgeon: Nada Libman, MD;  Location: [facility] CATH LAB;  Service: Cardiovascular;  Laterality: N/A;   WOUND EXPLORATION Left 06/15/2020   Procedure: GROIN  EXPLORATION;  Surgeon: Maeola Harman, MD;  Location: [facility] OR;  Service: Vascular;  Laterality: Left;    Allergies  No Known Allergies  History of Present  Illness    Mr. Tam returns today for ongoing assessment and management of hypertension, hyperlipidemia, nonobstructive CAD, having not been seen by cardiology since hospitalization in 2023 as discussed above.  Home Medications    Current Outpatient Medications  Medication Sig Dispense Refill   acetaminophen (TYLENOL) 325 MG tablet Take 2 tablets (650 mg total) by mouth every 6 (six) hours as needed for mild pain, fever or headache.     albuterol (VENTOLIN HFA) 108 (90 Base) MCG/ACT inhaler Inhale 3 puffs into the lungs daily as needed for shortness of breath or wheezing.     aspirin 81 MG chewable tablet Chew 1 tablet (81 mg total) by mouth daily. 30 tablet 2   atorvastatin (LIPITOR) 40 MG tablet Take 1 tablet (40 mg total) by mouth daily. 30 tablet 2   BREO ELLIPTA 200-25 MCG/ACT AEPB Inhale 2 puffs into the lungs daily as needed (for shortness of breath).     calcitRIOL (ROCALTROL) 0.25 MCG capsule Take 3 capsules (0.75 mcg total) by mouth Every Tuesday,Thursday,and Saturday with dialysis.     carvedilol (COREG) 6.25 MG tablet Take 1 tablet (6.25 mg total) by mouth 2 (two) times daily.     cinacalcet (SENSIPAR) 30 MG tablet Take 3 tablets (90 mg total) by mouth Every Tuesday,Thursday,and Saturday with dialysis. 60 tablet    Darbepoetin Alfa (ARANESP) 150 MCG/0.3ML SOSY injection Inject 0.3 mLs (150 mcg total) into the  skin every Tuesday at 6 PM. 1.68 mL    isosorbide mononitrate (IMDUR) 30 MG 24 hr tablet Take 0.5 tablets (15 mg total) by mouth daily. 15 tablet 2   multivitamin (RENA-VIT) TABS tablet Take 1 tablet by mouth at bedtime.  0   sevelamer carbonate (RENVELA) 800 MG tablet Take 5 tablets (4,000 mg total) by mouth 3 (three) times daily with meals.     No current facility-administered medications for this visit.   Facility-Administered Medications Ordered in Other Visits  Medication Dose Route Frequency Provider Last Rate Last Admin   midazolam (VERSED) 5 MG/5ML injection     Anesthesia Intra-op Sonda Primes, CRNA   2 mg at 04/11/18 1042     Family History    Family History  Problem Relation Age of Onset   Diabetes Father    Hypertension Father    Heart disease Father    He indicated that his mother is deceased. He indicated that his father is deceased. He indicated that his sister is alive. He indicated that his brother is alive.  Social History    Social History   Socioeconomic History   Marital status: Single    Spouse name: Not on file   Number of children: 2   Years of education: Not on file   Highest education level: 11th grade  Occupational History   Not on file  Tobacco Use   Smoking status: Every Day    Packs/day: 1.00    Years: 25.00    Additional pack years: 0.00    Total pack years: 25.00    Types: Cigarettes    Last attempt to quit: 08/01/2011    Years since quitting: 11.4   Smokeless tobacco: Never   Tobacco comments:    he smokes 3 cigarettes a day  Vaping Use   Vaping Use: Never used  Substance and Sexual Activity   Alcohol use: Yes    Alcohol/week: 10.0 standard drinks of alcohol    Types: 10 Glasses of wine per week    Comment: drank ETOH Saturday, 8 ounces vodka   Drug use: Not Currently    Types: Cocaine    Comment: not lately   Sexual activity: Not on file  Other Topics Concern   Not on file  Social History Narrative   Patient is living in a apartment alone. Professional cement finisher. Has 2 children (42yo & 38yo sons). Plans to make HCPOA for sister Gavin Pound to make decisions for him should he need them.    Social Determinants of Health   Financial Resource Strain: Low Risk  (12/29/2021)   Overall Financial Resource Strain (CARDIA)    Difficulty of Paying Living Expenses: Not very hard  Food Insecurity: No Food Insecurity (12/29/2021)   Hunger Vital Sign    Worried About Running Out of Food in the Last Year: Never true    Ran Out of Food in the Last Year: Never true  Transportation Needs: Unmet  Transportation Needs (07/24/2022)   PRAPARE - Administrator, Civil Service (Medical): Yes    Lack of Transportation (Non-Medical): Yes  Physical Activity: Not on file  Stress: Not on file  Social Connections: Unknown (08/18/2018)   Social Connection and Isolation Panel [NHANES]    Frequency of Communication with Friends and Family: Three times a week    Frequency of Social Gatherings with Friends and Family: Never    Attends Religious Services: Never    Database administrator or Organizations: No  Attends Banker Meetings: Never    Marital Status: Not on file  Intimate Partner Violence: Not At Risk (08/18/2018)   Humiliation, Afraid, Rape, and Kick questionnaire    Fear of Current or Ex-Partner: No    Emotionally Abused: No    Physically Abused: No    Sexually Abused: No     Review of Systems    General:  No chills, fever, night sweats or weight changes.  Cardiovascular:  No chest pain, dyspnea on exertion, edema, orthopnea, palpitations, paroxysmal nocturnal dyspnea. Dermatological: No rash, lesions/masses Respiratory: No cough, dyspnea Urologic: No hematuria, dysuria Abdominal:   No nausea, vomiting, diarrhea, bright red blood per rectum, melena, or hematemesis Neurologic:  No visual changes, wkns, changes in mental status. All other systems reviewed and are otherwise negative except as noted above.       Physical Exam    VS:  There were no vitals taken for this visit. , BMI There is no height or weight on file to calculate BMI.     GEN: Well nourished, well developed, in no acute distress. HEENT: normal. Neck: Supple, no JVD, carotid bruits, or masses. Cardiac: RRR, no murmurs, rubs, or gallops. No clubbing, cyanosis, edema.  Radials/DP/PT 2+ and equal bilaterally.  Respiratory:  Respirations regular and unlabored, clear to auscultation bilaterally. GI: Soft, nontender, nondistended, BS + x 4. MS: no deformity or atrophy. Skin: warm and dry, no  rash. Neuro:  Strength and sensation are intact. Psych: Normal affect.      Lab Results  Component Value Date   WBC 2.1 (L) 01/05/2023   HGB 9.2 (L) 01/05/2023   HCT 30.3 (L) 01/05/2023   MCV 104.1 (H) 01/05/2023   PLT 100 (L) 01/05/2023   Lab Results  Component Value Date   CREATININE 4.30 (H) 01/05/2023   BUN 14 01/05/2023   NA 136 01/05/2023   K 3.2 (L) 01/05/2023   CL 94 (L) 01/05/2023   CO2 31 01/05/2023   Lab Results  Component Value Date   ALT 13 01/05/2023   AST 17 01/05/2023   ALKPHOS 125 01/05/2023   BILITOT 0.6 01/05/2023   Lab Results  Component Value Date   CHOL 158 03/27/2018   HDL 74 03/27/2018   LDLCALC 69 03/27/2018   TRIG 649 (H) 04/04/2021   CHOLHDL 2.1 03/27/2018    Lab Results  Component Value Date   HGBA1C 4.6 (L) 09/14/2021     Review of Prior Studies   Echocardiogram 07/07/2022  1. Left ventricular ejection fraction, by estimation, is 45 to 50%. The  left ventricle has mildly decreased function. The left ventricle  demonstrates global hypokinesis. There is severe asymmetric left  ventricular hypertrophy of the inferior segment.  Left ventricular diastolic parameters are indeterminate.   2. Right ventricular systolic function is normal. The right ventricular  size is normal. There is normal pulmonary artery systolic pressure.   3. Left atrial size was severely dilated.   4. Right atrial size was severely dilated.   5. The mitral valve is normal in structure. No evidence of mitral valve  regurgitation. No evidence of mitral stenosis. Moderate mitral annular  calcification.   6. Tricuspid valve regurgitation is moderate.   7. The aortic valve is calcified. There is moderate calcification of the  aortic valve. There is moderate thickening of the aortic valve. Aortic  valve regurgitation is not visualized. Aortic valve  sclerosis/calcification is present, without any evidence  of aortic stenosis.   8. There is dilatation  of the aortic  root, measuring 41 mm. There is  dilatation of the ascending aorta, measuring 43 mm.   9. The inferior vena cava is normal in size with greater than 50%  respiratory variability, suggesting right atrial pressure of 3 mmHg.   LHC 03/29/2009 Mild, non-obstructive coronary artery disease. Normal left ventricular systolic function with upper normal left ventricular filling pressure.  Assessment & Plan   1.  ***     {Are you ordering a CV Procedure (e.g. stress test, cath, DCCV, TEE, etc)?   Press F2        :[id number]}   Signed, Bettey Mare. Liborio Nixon, ANP, AACC   01/13/2023 5:13 PM      Office [phone] Fax [phone]  Notice: This dictation was prepared with Dragon dictation along with smaller phrase technology. Any transcriptional errors that result from this process are unintentional and may not be corrected upon review.

## 2023-01-18 ENCOUNTER — Ambulatory Visit: Payer: Medicare HMO | Admitting: Adult Health

## 2023-02-12 NOTE — Progress Notes (Signed)
Cardiology Clinic Note   Patient Name: Raymond [name] Date of Encounter: 02/13/2023  Primary Care Provider:  [name], FNP Primary Cardiologist:  Charlton Haws, MD  Patient Profile    67 year old male with end-stage renal disease on hemodialysis, peripheral arterial disease, status post peripheral intervention, nonobstructive CAD, hypertension, atrial fibrillation/flutter (not on anticoagulation due to GI bleed), and substance abuse.  Last seen by cardiology during recent hospitalization December 2023 in the setting of acute pulmonary edema secondary to missed dialysis sessions.  The patient has had more hospitalizations in the setting of altered mental status and encephalopathy due to hypotension and hypovolemia during hemodialysis.  He was most recently evaluated in the ED on 01/05/2023 due to abdominal cramping during hemodialysis, EKG revealed atrial flutter.  He was also found to be hypertensive.  He was advised to continue taking carvedilol as prescribed for heart rate control and blood pressure control.  He has not been seen by cardiology since hospitalization in 2023, where cardiology was consulted due to atrial fibrillation recurrence.   Past Medical History    Past Medical History:  Diagnosis Date   Anemia    CAD (coronary artery disease)    a. 03/2018: cath showing 20 to 30% stenosis along the LAD, luminal irregularities along the RCA, and angiographically normal LCx   COPD (chronic obstructive pulmonary disease) (HCC)    ESRD (end stage renal disease) on dialysis (HCC)    "MWF; [facility]" (07/22/17)   Hepatitis C    Still positive s/p liver biopsy at [facility]  and interferon therapy for 6 months. Most recent lab work was on 10/24/12   Hepatitis C    "took the tx; gone now" (12/05/2016)  Was treated   History of blood transfusion ~ 2012/2013   "related to my kidneys; blood was low"   Hypertension    Peripheral arterial disease (HCC)    Substance abuse (HCC)    Thyroid  disease    Type 2 diabetes mellitus with other diabetic kidney complication (HCC) 09/24/2018   Past Surgical History:  Procedure Laterality Date   ABDOMINAL AORTOGRAM W/LOWER EXTREMITY N/A 02/08/2018   Procedure: ABDOMINAL AORTOGRAM W/LOWER EXTREMITY;  Surgeon: Fransisco Hertz, MD;  Location: [facility] INVASIVE CV LAB;  Service: Cardiovascular;  Laterality: N/A;   ABDOMINAL AORTOGRAM W/LOWER EXTREMITY Bilateral 06/15/2020   Procedure: ABDOMINAL AORTOGRAM W/LOWER EXTREMITY;  Surgeon: Maeola Harman, MD;  Location: [facility] INVASIVE CV LAB;  Service: Cardiovascular;  Laterality: Bilateral;   AV FISTULA PLACEMENT Left Aug. 2013 ?   BIOPSY  01/17/2020   Procedure: BIOPSY;  Surgeon: Lynann Bologna, MD;  Location: [facility] ENDOSCOPY;  Service: Endoscopy;;   COLONOSCOPY WITH PROPOFOL N/A 08/21/2018   Procedure: COLONOSCOPY WITH PROPOFOL;  Surgeon: Benancio Deeds, MD;  Location: [facility] ENDOSCOPY;  Service: Gastroenterology;  Laterality: N/A;   ENTEROSCOPY N/A 01/17/2020   Procedure: ENTEROSCOPY;  Surgeon: Lynann Bologna, MD;  Location: [facility] ENDOSCOPY;  Service: Endoscopy;  Laterality: N/A;   ENTEROSCOPY N/A 07/31/2020   Procedure: ENTEROSCOPY;  Surgeon: Sherrilyn Rist, MD;  Location: [facility] ENDOSCOPY;  Service: Gastroenterology;  Laterality: N/A;   ENTEROSCOPY N/A 01/27/2021   Procedure: ENTEROSCOPY;  Surgeon: Benancio Deeds, MD;  Location: [facility] ENDOSCOPY;  Service: Gastroenterology;  Laterality: N/A;   ENTEROSCOPY N/A 07/22/2022   Procedure: ENTEROSCOPY;  Surgeon: Sherrilyn Rist, MD;  Location: [facility] ENDOSCOPY;  Service: Gastroenterology;  Laterality: N/A;   ESOPHAGOGASTRODUODENOSCOPY (EGD) WITH PROPOFOL N/A 08/20/2018   Procedure: ESOPHAGOGASTRODUODENOSCOPY (EGD) WITH PROPOFOL;  Surgeon: Iva Boop, MD;  Location: [facility] ENDOSCOPY;  Service: Endoscopy;  Laterality: N/A;   FEMORAL-POPLITEAL BYPASS GRAFT Left 04/11/2018   Procedure: BYPASS GRAFT FEMORAL-POPLITEAL ARTERY LEFT LEG;  Surgeon: Larina Earthly, MD;  Location:  [facility] OR;  Service: Vascular;  Laterality: Left;   GIVENS CAPSULE STUDY N/A 07/31/2020   Procedure: GIVENS CAPSULE STUDY;  Surgeon: Sherrilyn Rist, MD;  Location: [facility] ENDOSCOPY;  Service: Gastroenterology;  Laterality: N/A;   HOT HEMOSTASIS N/A 08/20/2018   Procedure: HOT HEMOSTASIS (ARGON PLASMA COAGULATION/BICAP);  Surgeon: Iva Boop, MD;  Location: [facility] ENDOSCOPY;  Service: Endoscopy;  Laterality: N/A;   HOT HEMOSTASIS N/A 01/17/2020   Procedure: HOT HEMOSTASIS (ARGON PLASMA COAGULATION/BICAP);  Surgeon: Lynann Bologna, MD;  Location: [facility] ENDOSCOPY;  Service: Endoscopy;  Laterality: N/A;   HOT HEMOSTASIS N/A 01/27/2021   Procedure: HOT HEMOSTASIS (ARGON PLASMA COAGULATION/BICAP);  Surgeon: Benancio Deeds, MD;  Location: [facility] ENDOSCOPY;  Service: Gastroenterology;  Laterality: N/A;   HOT HEMOSTASIS N/A 07/22/2022   Procedure: HOT HEMOSTASIS (ARGON PLASMA COAGULATION/BICAP);  Surgeon: Sherrilyn Rist, MD;  Location: [facility] ENDOSCOPY;  Service: Gastroenterology;  Laterality: N/A;   LEFT HEART CATH AND CORONARY ANGIOGRAPHY N/A 03/29/2018   Procedure: LEFT HEART CATH AND CORONARY ANGIOGRAPHY;  Surgeon: Yvonne Kendall, MD;  Location: [facility] INVASIVE CV LAB;  Service: Cardiovascular;  Laterality: N/A;   LIVER BIOPSY     ORIF ULNAR FRACTURE Left 05/18/2017   Procedure: OPEN REDUCTION INTERNAL FIXATION (ORIF) ULNAR FRACTURE;  Surgeon: Bradly Bienenstock, MD;  Location: [facility] OR;  Service: Orthopedics;  Laterality: Left;   PERIPHERAL VASCULAR INTERVENTION Right 06/15/2020   Procedure: PERIPHERAL VASCULAR INTERVENTION;  Surgeon: Maeola Harman, MD;  Location: [facility] INVASIVE CV LAB;  Service: Cardiovascular;  Laterality: Right;   POLYPECTOMY  08/21/2018   Procedure: POLYPECTOMY;  Surgeon: Benancio Deeds, MD;  Location: [facility] ENDOSCOPY;  Service: Gastroenterology;;   Betsey Amen N/A 12/11/2012   Procedure: Betsey Amen;  Surgeon: Nada Libman, MD;  Location: [facility] CATH LAB;  Service: Cardiovascular;  Laterality:  N/A;   WOUND EXPLORATION Left 06/15/2020   Procedure: GROIN  EXPLORATION;  Surgeon: Maeola Harman, MD;  Location: [facility] OR;  Service: Vascular;  Laterality: Left;    Allergies  No Known Allergies  History of Present Illness    Mr. Stranz returns today for ongoing assessment and management of paroxysmal atrial fib/flutter not on anticoagulation due to GI bleed, coronary artery disease on medical management only, EF of 45 to 50% with global hypokinesis with severe asymmetric left ventricular hypertrophy of the inferior segment.    Mr. Mincey is doing well.  He denies any significant amount of chest pain, he does have occasional pressure.  He denies rapid heart rate, palpitations, dizziness, or near syncope.  He goes to dialysis on Tuesdays, Thursdays, and Saturdays.  He is medically compliant.  Home Medications    Current Outpatient Medications  Medication Sig Dispense Refill   acetaminophen (TYLENOL) 325 MG tablet Take 2 tablets (650 mg total) by mouth every 6 (six) hours as needed for mild pain, fever or headache.     albuterol (VENTOLIN HFA) 108 (90 Base) MCG/ACT inhaler Inhale 3 puffs into the lungs daily as needed for shortness of breath or wheezing.     amLODipine (NORVASC) 5 MG tablet Take 5 mg by mouth daily.     aspirin 81 MG chewable tablet Chew 1 tablet (81 mg total) by mouth daily. 30 tablet 2   BREO ELLIPTA 200-25 MCG/ACT AEPB  Inhale 2 puffs into the lungs daily as needed (for shortness of breath).     calcitRIOL (ROCALTROL) 0.25 MCG capsule Take 3 capsules (0.75 mcg total) by mouth Every Tuesday,Thursday,and Saturday with dialysis.     carvedilol (COREG) 6.25 MG tablet Take 1 tablet (6.25 mg total) by mouth 2 (two) times daily.     cinacalcet (SENSIPAR) 30 MG tablet Take 3 tablets (90 mg total) by mouth Every Tuesday,Thursday,and Saturday with dialysis. 60 tablet    Darbepoetin Alfa (ARANESP) 150 MCG/0.3ML SOSY injection Inject 0.3 mLs (150 mcg total) into the skin every  Tuesday at 6 PM. 1.68 mL    FEROSUL 325 (65 Fe) MG tablet TAKE 1 Tablet BY MOUTH ONCE DAILY (IN THE MORNING)     hydrOXYzine (ATARAX) 50 MG tablet TAKE 1 OR 2 TABLETS BY MOUTH at bedtime AS NEEDED FOR insomnia     ketorolac (ACULAR) 0.4 % SOLN apply 1 drop IN THE LEFT EYE FOUR TIMES DAILY. start one WEEK BEFORE surgery     Methoxy PEG-Epoetin Beta (MIRCERA IJ) Mircera     multivitamin (RENA-VIT) TABS tablet Take 1 tablet by mouth at bedtime.  0   sevelamer carbonate (RENVELA) 800 MG tablet Take 5 tablets (4,000 mg total) by mouth 3 (three) times daily with meals.     Vitamin D, Ergocalciferol, 50000 units CAPS TAKE ONE CAPSULE BY MOUTH WEEKLY (sunday morning)     WIXELA INHUB 500-50 MCG/ACT AEPB Inhale into the lungs.     atorvastatin (LIPITOR) 40 MG tablet Take 1 tablet (40 mg total) by mouth daily. 30 tablet 2   isosorbide mononitrate (IMDUR) 30 MG 24 hr tablet Take 0.5 tablets (15 mg total) by mouth daily. 15 tablet 2   No current facility-administered medications for this visit.   Facility-Administered Medications Ordered in Other Visits  Medication Dose Route Frequency Provider Last Rate Last Admin   midazolam (VERSED) 5 MG/5ML injection    Anesthesia Intra-op Sonda Primes, CRNA   2 mg at 04/11/18 1042     Family History    Family History  Problem Relation Age of Onset   Diabetes Father    Hypertension Father    Heart disease Father    He indicated that his mother is deceased. He indicated that his father is deceased. He indicated that his sister is alive. He indicated that his brother is alive.  Social History    Social History   Socioeconomic History   Marital status: Single    Spouse name: Not on file   Number of children: 2   Years of education: Not on file   Highest education level: 11th grade  Occupational History   Not on file  Tobacco Use   Smoking status: Every Day    Current packs/day: 0.00    Average packs/day: 1 pack/day for 25.0 years (25.0 ttl  pk-yrs)    Types: Cigarettes    Start date: 07/31/1986    Last attempt to quit: 08/01/2011    Years since quitting: 11.5   Smokeless tobacco: Never   Tobacco comments:    he smokes 3 cigarettes a day  Vaping Use   Vaping status: Never Used  Substance and Sexual Activity   Alcohol use: Yes    Alcohol/week: 10.0 standard drinks of alcohol    Types: 10 Glasses of wine per week    Comment: drank ETOH Saturday, 8 ounces vodka   Drug use: Not Currently    Types: Cocaine    Comment: not lately  Sexual activity: Not on file  Other Topics Concern   Not on file  Social History Narrative   Patient is living in a apartment alone. Professional cement finisher. Has 2 children (42yo & 38yo sons). Plans to make HCPOA for sister Gavin Pound to make decisions for him should he need them.    Social Determinants of Health   Financial Resource Strain: Low Risk  (12/29/2021)   Overall Financial Resource Strain (CARDIA)    Difficulty of Paying Living Expenses: Not very hard  Food Insecurity: No Food Insecurity (12/29/2021)   Hunger Vital Sign    Worried About Running Out of Food in the Last Year: Never true    Ran Out of Food in the Last Year: Never true  Transportation Needs: Unmet Transportation Needs (07/24/2022)   PRAPARE - Administrator, Civil Service (Medical): Yes    Lack of Transportation (Non-Medical): Yes  Physical Activity: Not on file  Stress: Not on file  Social Connections: Unknown (08/18/2018)   Social Connection and Isolation Panel [NHANES]    Frequency of Communication with Friends and Family: Three times a week    Frequency of Social Gatherings with Friends and Family: Never    Attends Religious Services: Never    Database administrator or Organizations: No    Attends Banker Meetings: Never    Marital Status: Not on file  Intimate Partner Violence: Not At Risk (08/18/2018)   Humiliation, Afraid, Rape, and Kick questionnaire    Fear of Current or Ex-Partner:  No    Emotionally Abused: No    Physically Abused: No    Sexually Abused: No     Review of Systems    General:  No chills, fever, night sweats or weight changes.  Cardiovascular:  No chest pain, dyspnea on exertion, edema, orthopnea, palpitations, paroxysmal nocturnal dyspnea. Dermatological: No rash, lesions/masses Respiratory: No cough, dyspnea Urologic: No hematuria, dysuria Abdominal:   No nausea, vomiting, diarrhea, bright red blood per rectum, melena, or hematemesis Neurologic:  No visual changes, wkns, changes in mental status. All other systems reviewed and are otherwise negative except as noted above.       Physical Exam    VS:  BP (!) 140/90   Pulse 78   Ht 5\' 8"  (1.727 m)   Wt 157 lb 3.2 oz (71.3 kg)   SpO2 95%   BMI 23.90 kg/m  , BMI Body mass index is 23.9 kg/m.     GEN: Well nourished, well developed, in no acute distress. HEENT: normal. Neck: Supple, no JVD, carotid bruits, or masses. Cardiac: IRRR, systolic murmur, rubs, or gallops. No clubbing, cyanosis, nonpitting bilateral lower extremity edema.  Radials/DP diminished and equal bilaterally.  Dialysis shunt to the left forearm good thrill. Respiratory:  Respirations regular and unlabored, clear to auscultation bilaterally. GI: Soft, nontender, nondistended, BS + x 4. MS: no deformity or atrophy. Skin: warm and dry, no rash. Neuro:  Strength and sensation are intact. Psych: Normal affect.      Lab Results  Component Value Date   WBC 2.1 (L) 01/05/2023   HGB 9.2 (L) 01/05/2023   HCT 30.3 (L) 01/05/2023   MCV 104.1 (H) 01/05/2023   PLT 100 (L) 01/05/2023   Lab Results  Component Value Date   CREATININE 4.30 (H) 01/05/2023   BUN 14 01/05/2023   NA 136 01/05/2023   K 3.2 (L) 01/05/2023   CL 94 (L) 01/05/2023   CO2 31 01/05/2023   Lab Results  Component Value Date   ALT 13 01/05/2023   AST 17 01/05/2023   ALKPHOS 125 01/05/2023   BILITOT 0.6 01/05/2023   Lab Results  Component Value Date    CHOL 158 03/27/2018   HDL 74 03/27/2018   LDLCALC 69 03/27/2018   TRIG 649 (H) 04/04/2021   CHOLHDL 2.1 03/27/2018    Lab Results  Component Value Date   HGBA1C 4.6 (L) 09/14/2021     Review of Prior Studies    Echocardiogram 07/07/2022   1. Left ventricular ejection fraction, by estimation, is 45 to 50%. The  left ventricle has mildly decreased function. The left ventricle  demonstrates global hypokinesis. There is severe asymmetric left  ventricular hypertrophy of the inferior segment.  Left ventricular diastolic parameters are indeterminate.   2. Right ventricular systolic function is normal. The right ventricular  size is normal. There is normal pulmonary artery systolic pressure.   3. Left atrial size was severely dilated.   4. Right atrial size was severely dilated.   5. The mitral valve is normal in structure. No evidence of mitral valve  regurgitation. No evidence of mitral stenosis. Moderate mitral annular  calcification.   6. Tricuspid valve regurgitation is moderate.   7. The aortic valve is calcified. There is moderate calcification of the  aortic valve. There is moderate thickening of the aortic valve. Aortic  valve regurgitation is not visualized. Aortic valve  sclerosis/calcification is present, without any evidence  of aortic stenosis.   8. There is dilatation of the aortic root, measuring 41 mm. There is  dilatation of the ascending aorta, measuring 43 mm.   9. The inferior vena cava is normal in size with greater than 50%  respiratory variability, suggesting right atrial pressure of 3 mmHg.    Assessment & Plan   1.  Atrial flutter: Heart rate is well-controlled currently.  He remains on carvedilol, Eliquis, without any complaints of rapid heart rhythm or bleeding issues.  No changes in his regimen at this time.  Labs are ordered through dialysis treatments.  He is a little anemic and is on iron replacement.  Would likely stop aspirin as he is on  OAC.  2.  Hypertension: He remains on amlodipine along with carvedilol.  Blood pressure is moderately elevated today he is due for dialysis tomorrow.  He is medically compliant.    3.   Coronary artery disease: Per cardiac catheterization dated 03/29/2018 nonobstructive mild coronary artery disease with normal LVEF.  Signed, Bettey Mare. Liborio Nixon, ANP, AACC   02/13/2023 3:00 PM      Office [phone] Fax [phone]  Notice: This dictation was prepared with Dragon dictation along with smaller phrase technology. Any transcriptional errors that result from this process are unintentional and may not be corrected upon review.

## 2023-02-13 ENCOUNTER — Ambulatory Visit: Payer: Medicare HMO | Attending: Adult Health | Admitting: Adult Health

## 2023-02-13 ENCOUNTER — Encounter: Payer: Self-pay | Admitting: Adult Health

## 2023-02-13 VITALS — BP 140/90 | HR 78 | Ht 68.0 in | Wt 157.2 lb

## 2023-02-13 DIAGNOSIS — I10 Essential (primary) hypertension: Secondary | ICD-10-CM | POA: Diagnosis not present

## 2023-02-13 NOTE — Patient Instructions (Signed)
Medication Instructions:  No changes *If you need a refill on your cardiac medications before your next appointment, please call your pharmacy*   Lab Work: No Labs If you [place] labs (blood work) drawn today and your tests are completely normal, you will receive your results only by: MyChart Message (if you have MyChart) OR A paper copy in the mail If you have any lab test that is abnormal or we need to change your treatment, we will call you to review the results.   Testing/Procedures: No Testing   Follow-Up: At [facility], you and your health needs are our priority.  As part of our continuing mission to provide you with exceptional heart care, we have created designated Provider Care Teams.  These Care Teams include your primary Cardiologist (physician) and Advanced Practice Providers (APPs -  Physician Assistants and Nurse Practitioners) who all work together to provide you with the care you need, when you need it.  We recommend signing up for the patient portal called "MyChart".  Sign up information is provided on this After Visit Summary.  MyChart is used to connect with patients for Virtual Visits (Telemedicine).  Patients are able to view lab/test results, encounter notes, upcoming appointments, etc.  Non-urgent messages can be sent to your provider as well.   To learn more about what you can do with MyChart, go to [email or web address].    Your next appointment:   6 month(s)  Provider:   Charlton Haws, MD

## 2023-03-20 ENCOUNTER — Ambulatory Visit: Payer: Medicare HMO | Admitting: Podiatry

## 2023-06-11 DEATH — deceased

## 2023-08-17 ENCOUNTER — Encounter: Payer: Self-pay | Admitting: *Deleted

## 2023-08-22 ENCOUNTER — Encounter: Payer: Self-pay | Admitting: Gastroenterology
